# Patient Record
Sex: Male | Born: 1950 | Race: White | Hispanic: No | Marital: Married | Smoking: Current some day smoker
Health system: Southern US, Community
[De-identification: ages and names within clinical notes are randomized; demographics above are authoritative.]

## PROBLEM LIST (undated history)

## (undated) DIAGNOSIS — C801 Malignant (primary) neoplasm, unspecified: Secondary | ICD-10-CM

## (undated) HISTORY — PX: APPENDECTOMY: SHX54

---

## 1998-07-10 ENCOUNTER — Emergency Department (HOSPITAL_COMMUNITY): Admission: EM | Admit: 1998-07-10 | Discharge: 1998-07-11 | Payer: Self-pay | Admitting: Internal Medicine

## 1999-06-20 ENCOUNTER — Ambulatory Visit (HOSPITAL_BASED_OUTPATIENT_CLINIC_OR_DEPARTMENT_OTHER): Admission: RE | Admit: 1999-06-20 | Discharge: 1999-06-20 | Payer: Self-pay | Admitting: Orthopedic Surgery

## 1999-07-27 ENCOUNTER — Ambulatory Visit (HOSPITAL_BASED_OUTPATIENT_CLINIC_OR_DEPARTMENT_OTHER): Admission: RE | Admit: 1999-07-27 | Discharge: 1999-07-27 | Payer: Self-pay | Admitting: Orthopedic Surgery

## 2003-05-31 ENCOUNTER — Emergency Department (HOSPITAL_COMMUNITY): Admission: EM | Admit: 2003-05-31 | Discharge: 2003-05-31 | Payer: Self-pay | Admitting: Emergency Medicine

## 2003-05-31 ENCOUNTER — Encounter: Payer: Self-pay | Admitting: Emergency Medicine

## 2003-05-31 IMAGING — CT CT PELVIS W/O CM
1 series · 16 of 32 positions shown, 20 images · non-contrast
Comparison: NONE.

FINDINGS
CLINICAL DATA: RENAL CALCULI.
CT ABDOMEN AND PELVIS WITHOUT CONTRAST, [DATE], [3N] HOURS

[Series 2: renal stone · axial · 0.62mm/px · z∈[-561,-186]mm · 16 of 83 slices shown, 20 images]
[im 6/83  soft-tissue]
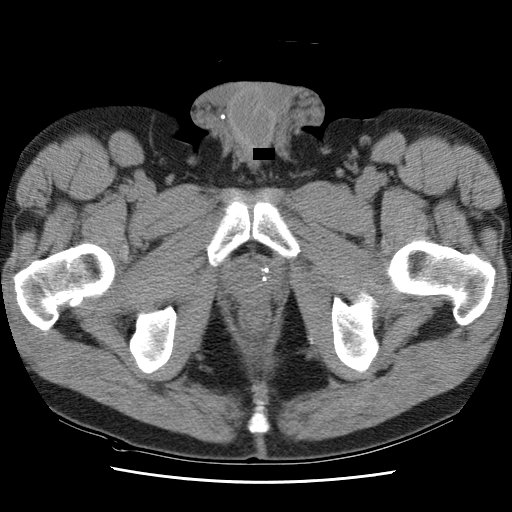
[im 6/83  bone]
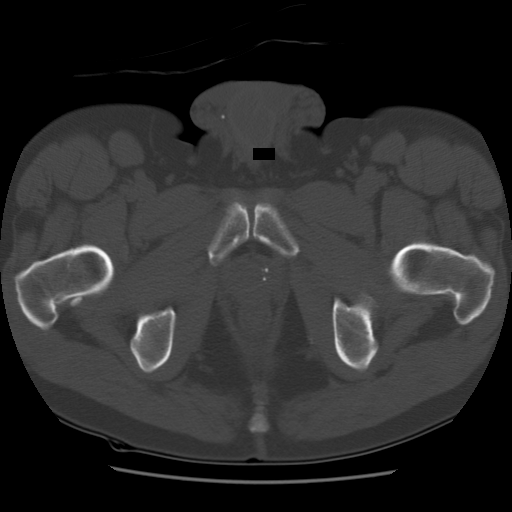
[im 11/83  soft-tissue]
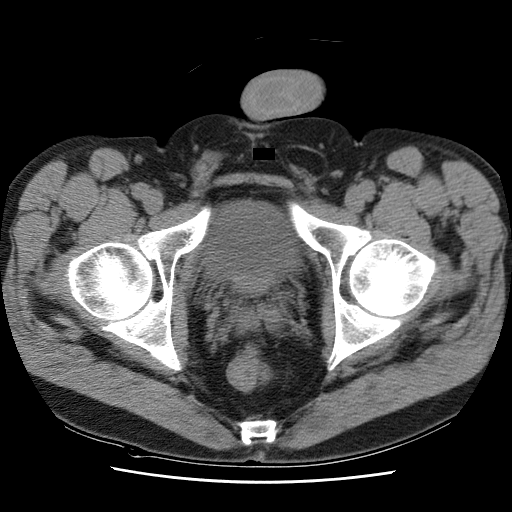
[im 16/83  soft-tissue]
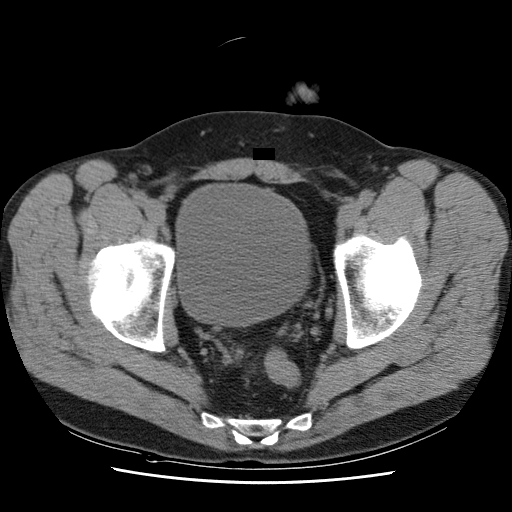
[im 22/83  soft-tissue]
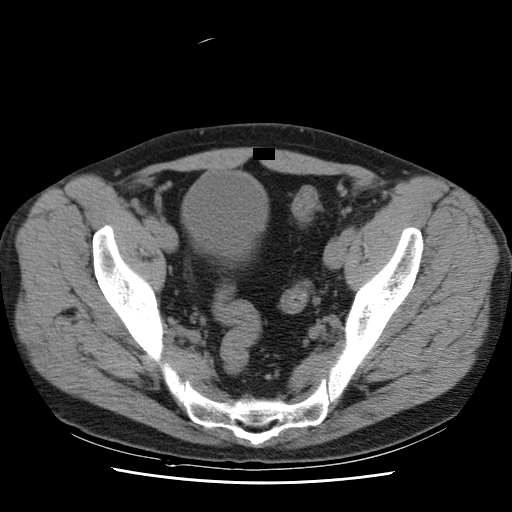
[im 27/83  soft-tissue]
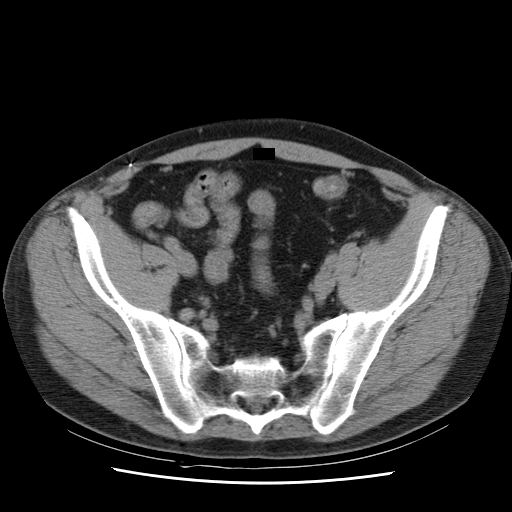
[im 32/83  soft-tissue]
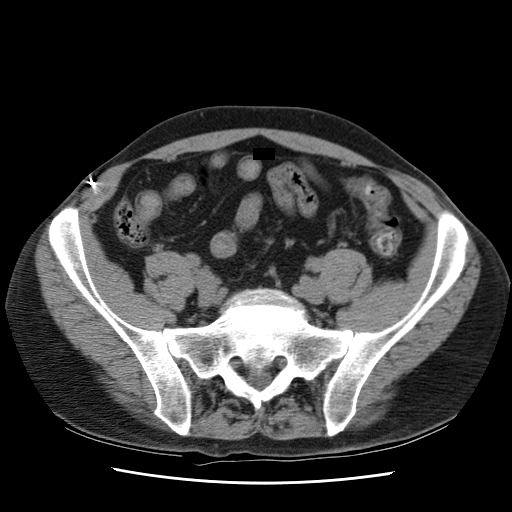
[im 38/83  soft-tissue]
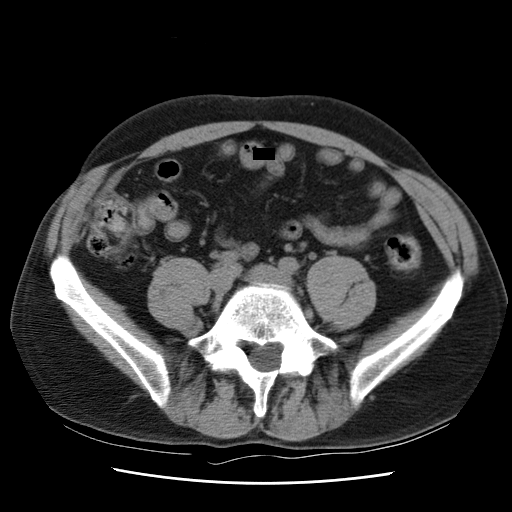
[im 45/83  soft-tissue]
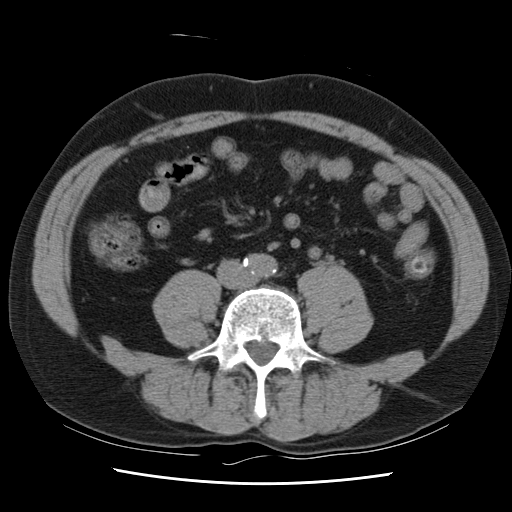
[im 51/83  soft-tissue]
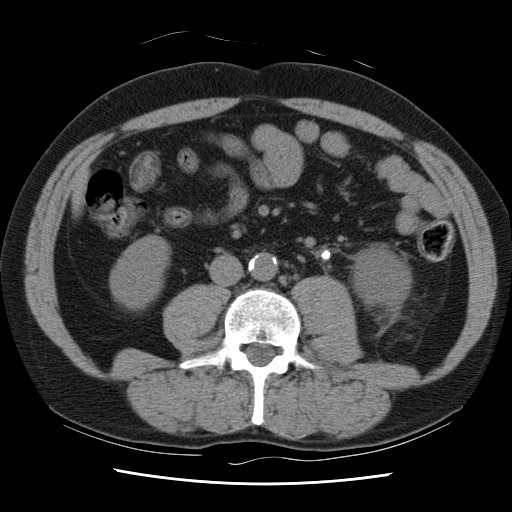
[im 51/83  bone]
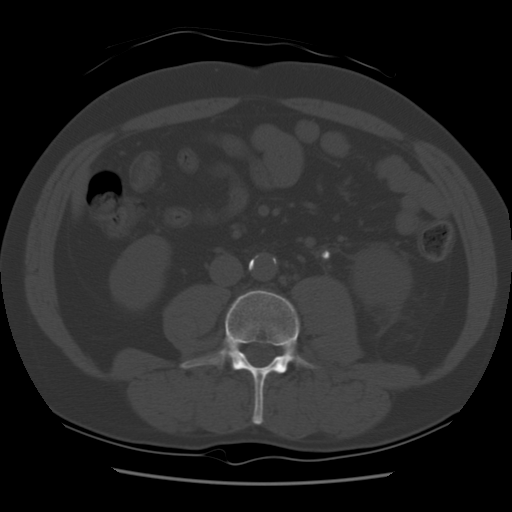
[im 56/83  soft-tissue]
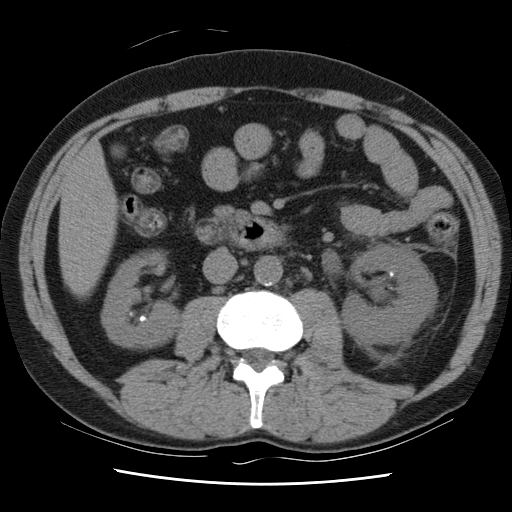
[im 61/83  soft-tissue]
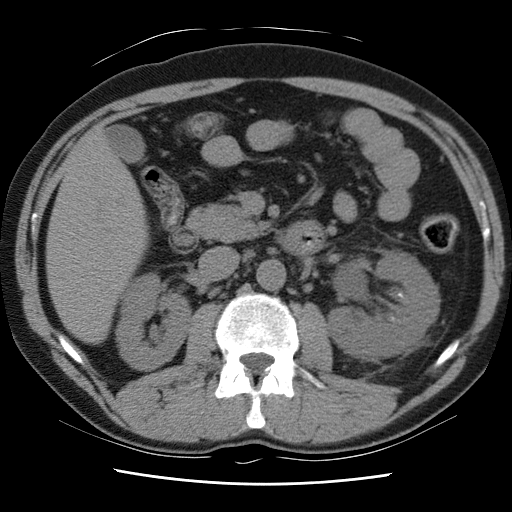
[im 67/83  soft-tissue]
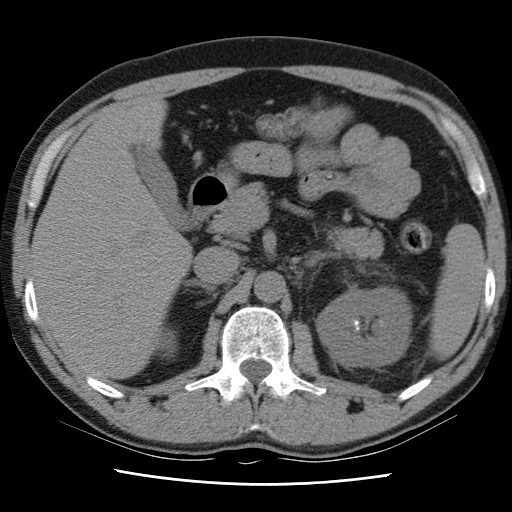
[im 72/83  soft-tissue]
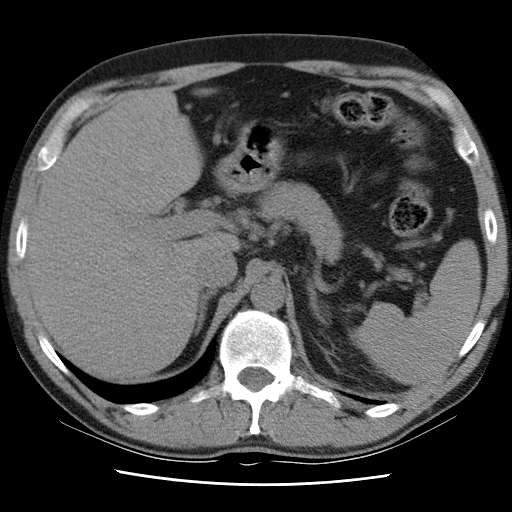
[im 72/83  lung]
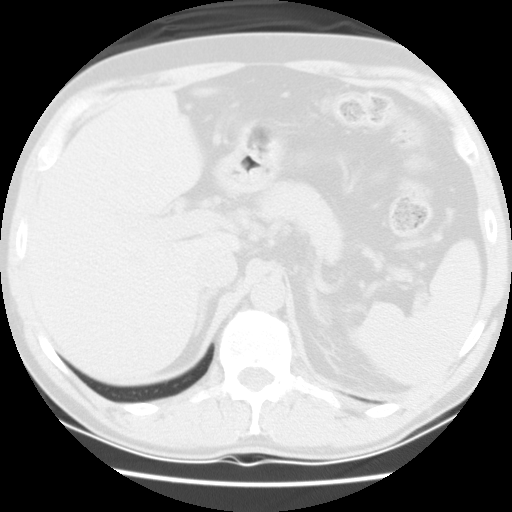
[im 75/83  lung]
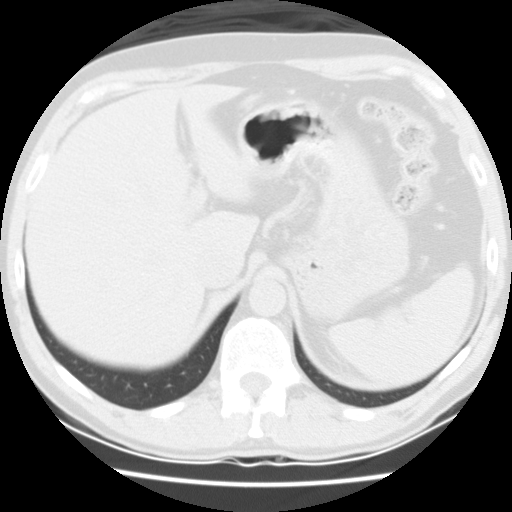
[im 77/83  soft-tissue]
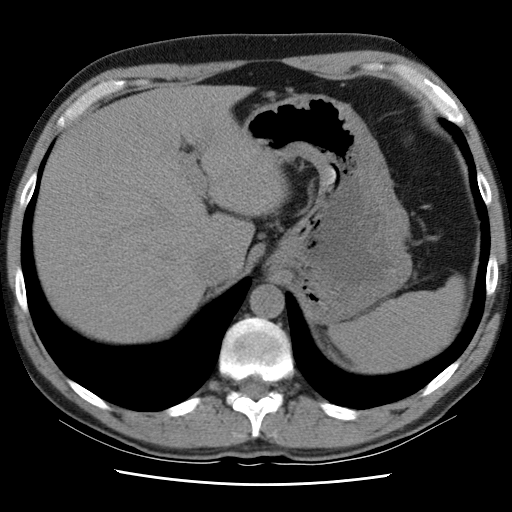
[im 77/83  lung]
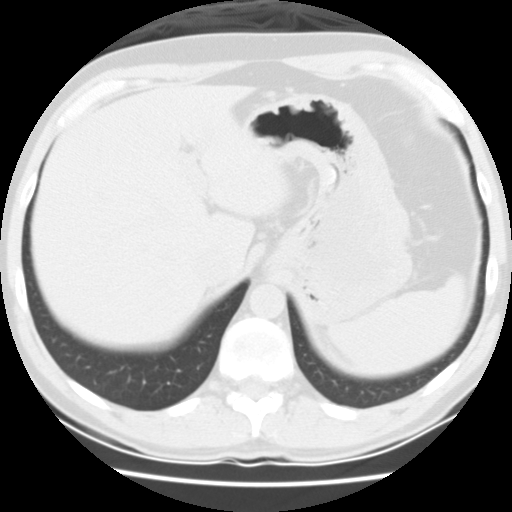
[im 80/83  lung]
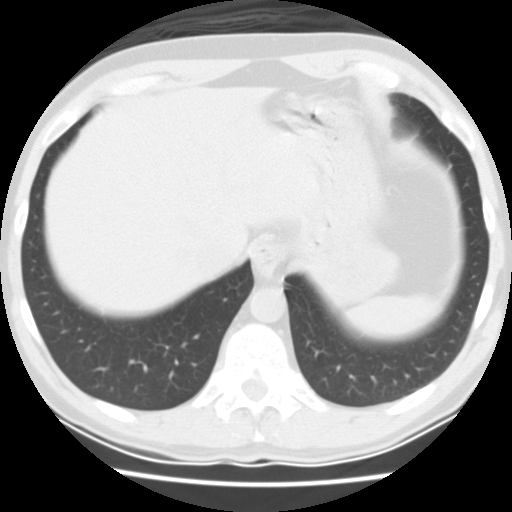

[16 of 32 positions shown; findings below may reference images not displayed]

TECHNICAL INFORMATION:  5 MM SPIRAL IMAGES WERE OBTAINED THROUGH THE ABDOMEN AND PELVIS WITHOUT
CONTRAST.
ABDOMEN
FINDINGS: MULTIPLE CALCULI ARE SEEN IN THE COLLECTING SYSTEM OF BOTH KIDNEYS.  MILD LEFT
HYDRONEPHROSIS AND PERINEPHRIC STRANDING ARE NOTED.  THERE IS A 6 MM CALCULUS IN THE PROXIMAL LEFT
URETER ON IMAGE 35.
THE VISUALIZED PORTIONS OF THE LIVER, SPLEEN, PANCREAS, AND ADRENAL GLANDS ARE WITHIN NORMAL
LIMITS.  NEGATIVE FREE FLUID.  NEGATIVE ABNORMAL ADENOPATHY.
IMPRESSION
1.  NEPHROLITHIASIS.
2.  LEFT URETERAL OBSTRUCTION FROM A LEFT 6-7 MM CALCULUS.
PELVIS
IN THE PELVIS THE BLADDER IS DISTENDED.  MULTIPLE PHLEBOLITHS ARE SEEN.  THERE IS NO EVIDENCE OF
URINARY CALCULUS IN THE PELVIS.
IMPRESSION
NO EVIDENCE OF URINARY CALCULUS IN THE PELVIS.

## 2003-06-01 ENCOUNTER — Inpatient Hospital Stay (HOSPITAL_COMMUNITY): Admission: EM | Admit: 2003-06-01 | Discharge: 2003-06-03 | Payer: Self-pay | Admitting: *Deleted

## 2003-06-01 ENCOUNTER — Encounter: Payer: Self-pay | Admitting: *Deleted

## 2003-06-02 ENCOUNTER — Encounter: Payer: Self-pay | Admitting: *Deleted

## 2003-06-03 ENCOUNTER — Emergency Department (HOSPITAL_COMMUNITY): Admission: EM | Admit: 2003-06-03 | Discharge: 2003-06-04 | Payer: Self-pay | Admitting: Emergency Medicine

## 2003-06-03 ENCOUNTER — Encounter: Payer: Self-pay | Admitting: *Deleted

## 2003-06-08 ENCOUNTER — Inpatient Hospital Stay (HOSPITAL_COMMUNITY): Admission: EM | Admit: 2003-06-08 | Discharge: 2003-06-09 | Payer: Self-pay | Admitting: Emergency Medicine

## 2003-06-08 ENCOUNTER — Encounter: Payer: Self-pay | Admitting: *Deleted

## 2003-06-09 ENCOUNTER — Encounter: Payer: Self-pay | Admitting: *Deleted

## 2007-06-26 ENCOUNTER — Emergency Department (HOSPITAL_COMMUNITY): Admission: EM | Admit: 2007-06-26 | Discharge: 2007-06-26 | Payer: Self-pay | Admitting: Emergency Medicine

## 2007-06-26 IMAGING — CT CT ABDOMEN W/O CM
1 series · 15 of 32 positions shown, 19 images · IV contrast (agent unspecified)
Comparison: Report dated [DATE].

CLINICAL DATA: Right flank pain, nausea and vomiting. History of
nephrolithiasis.

ABDOMEN CT WITHOUT CONTRAST:
TECHNIQUE: Helical transaxial images of the abdomen and pelvis were obtained
without intravenous or oral contrast.

[Series 2: stone_wo 5.0 b40f st · axial · 0.68mm/px · z∈[-523,-139]mm · 15 of 107 slices shown, 19 images]
[im 7/107  soft-tissue]
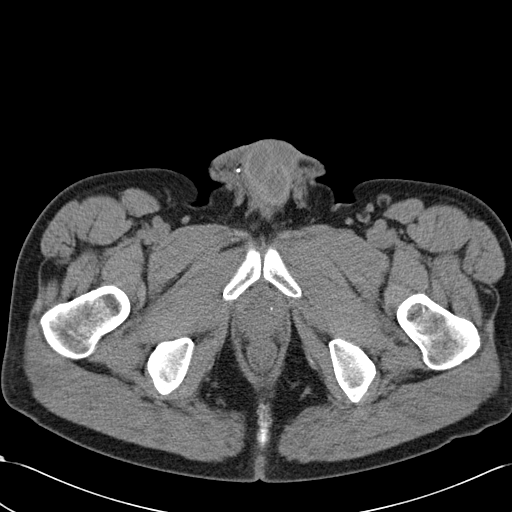
[im 7/107  bone]
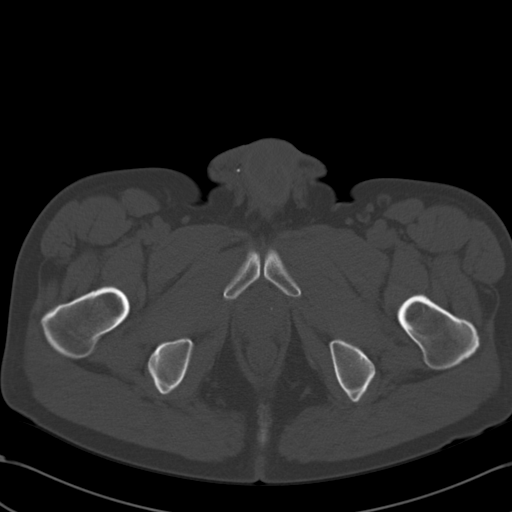
[im 14/107  soft-tissue]
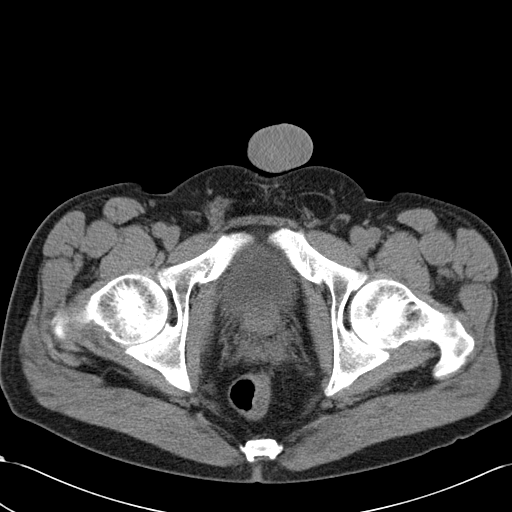
[im 21/107  soft-tissue]
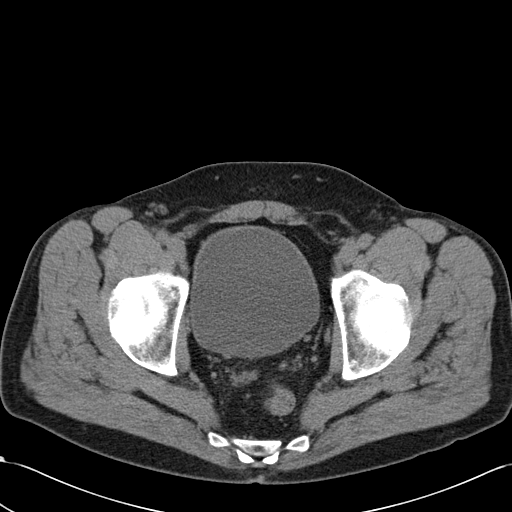
[im 31/107  soft-tissue]
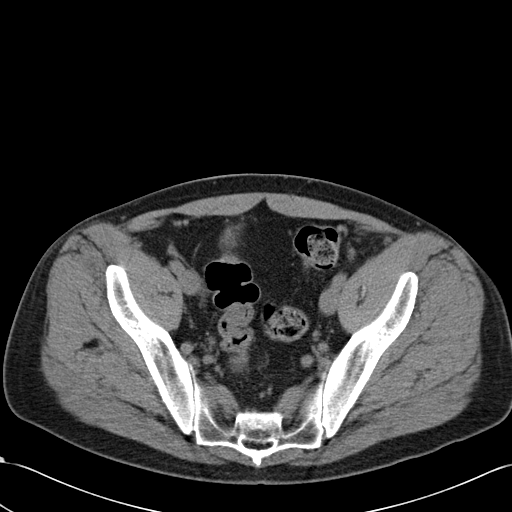
[im 38/107  soft-tissue]
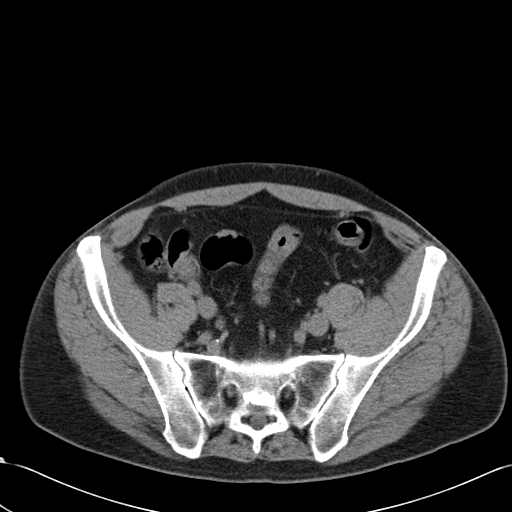
[im 45/107  soft-tissue]
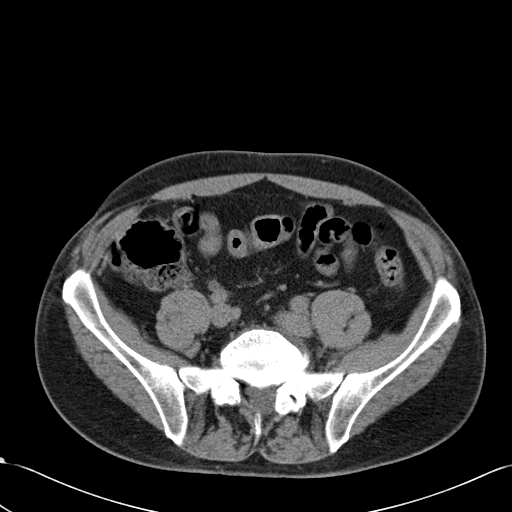
[im 55/107  soft-tissue]
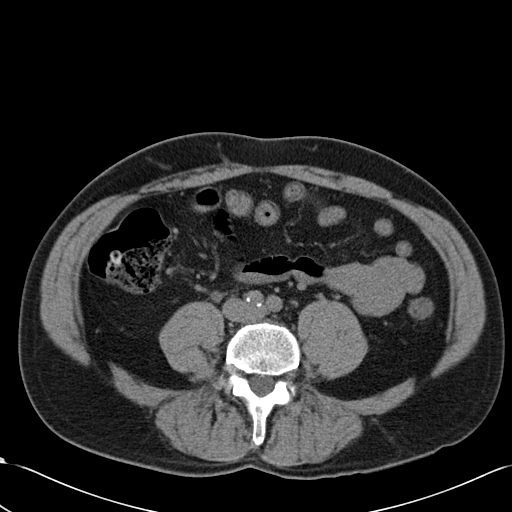
[im 62/107  soft-tissue]
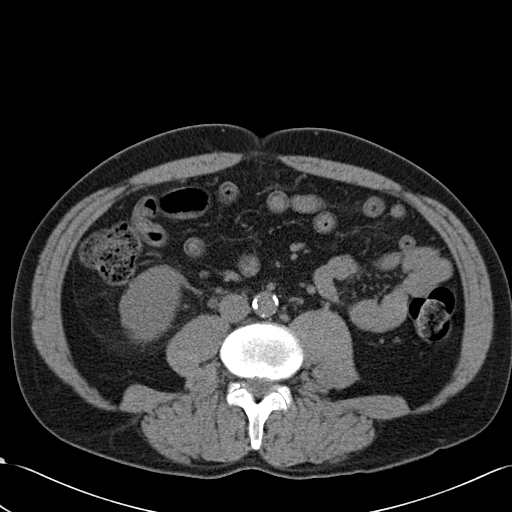
[im 69/107  soft-tissue]
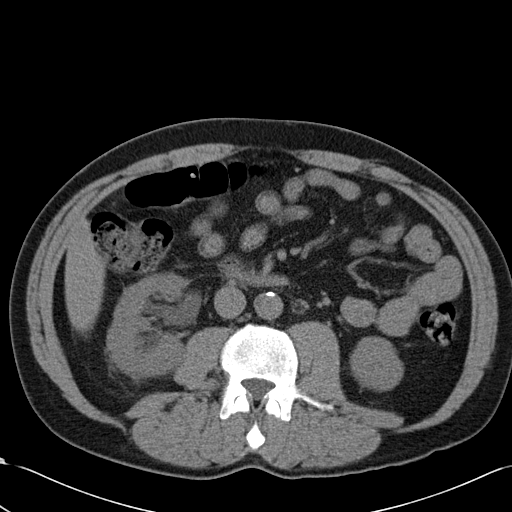
[im 69/107  bone]
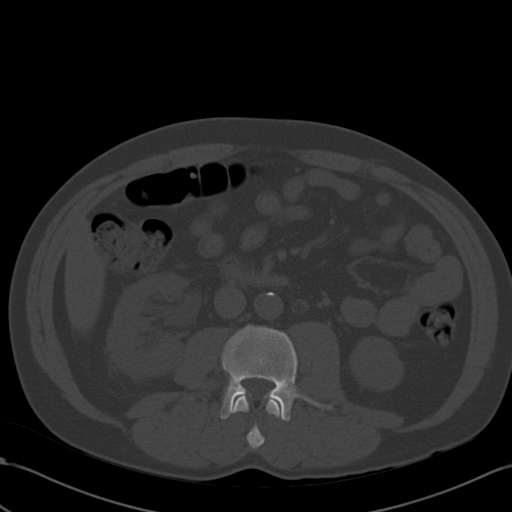
[im 76/107  soft-tissue]
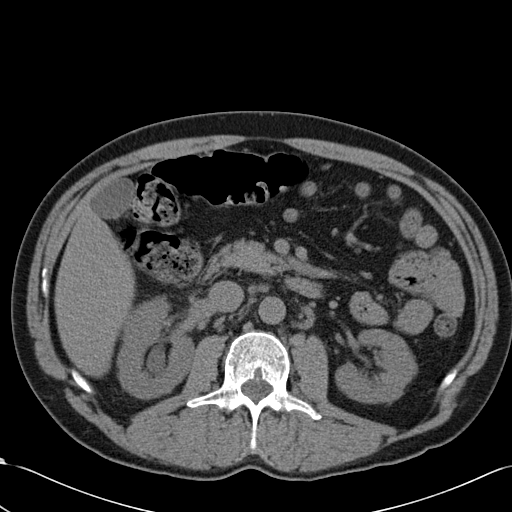
[im 86/107  soft-tissue]
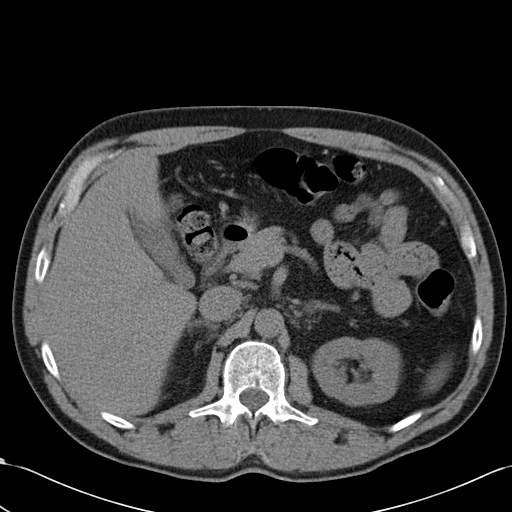
[im 93/107  soft-tissue]
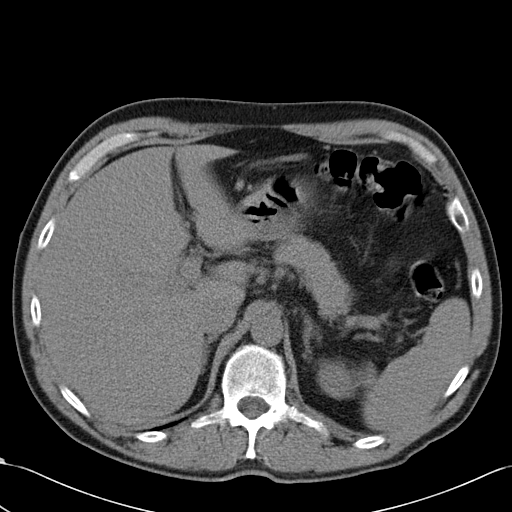
[im 93/107  lung]
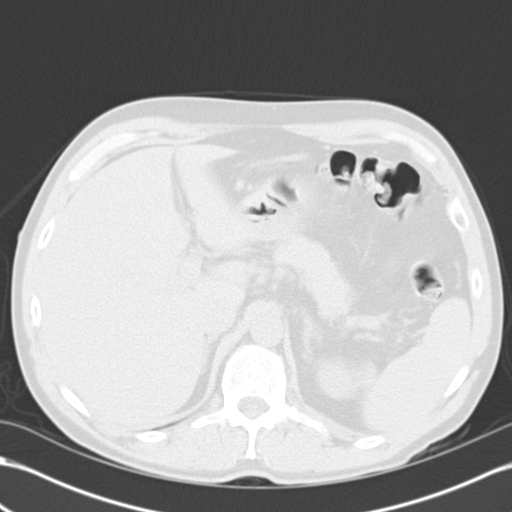
[im 96/107  lung]
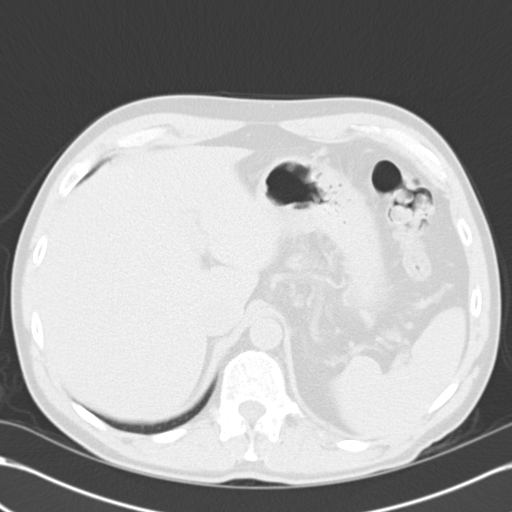
[im 100/107  soft-tissue]
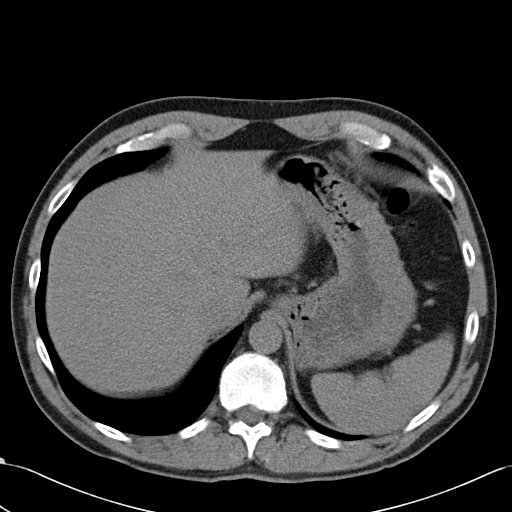
[im 100/107  lung]
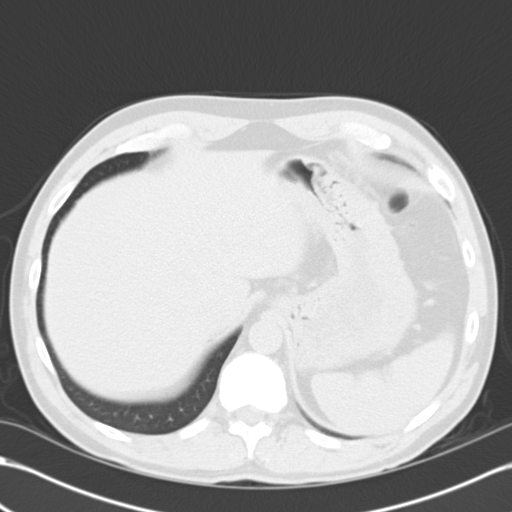
[im 103/107  lung]
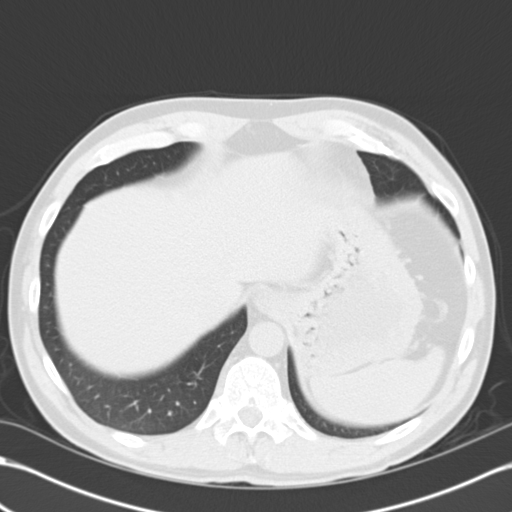

[15 of 32 positions shown; findings below may reference images not displayed]

FINDINGS: Multiple small bilateral renal calculi. 4 mm, 3 mm and additional 3
mm calculi in the proximal right ureter. 6 mm calculus in the proximal to the
mid right ureter. Moderate dilatation of the right renal collecting system.
Diffuse enlargement of the left kidney with mild perinephric soft tissue
stranding. Lumbar spine degenerative changes. Atheromatous artery
calcifications.
IMPRESSION: 1. Multiple right ureteral calculi, with associated moderate right
hydronephrosis.
2. Multiple small bilateral renal calculi.

PELVIS CT WITHOUT CONTRAST:
FINDINGS: 6 mm calculus in the proximal to mid pelvic portion of the right
ureter. Mild dilatation of the distal right ureter, to the level of the
ureterovesical junction, with no distal ureteral or bladder calculi seen.
Central prostatic calcifications. Small left inguinal hernia containing fat.
Left renal calculi seen.
IMPRESSION: 1. Total of 5 right ureteral calculi, measuring up to 6 mm in maximum diameter
each, causing moderate right hydronephrosis.

2. Mild dilatation of the distal right ureter, most likely due to recent stone
passage. 

3. Small left inguinal hernia containing fat.

## 2011-05-10 NOTE — Op Note (Signed)
NAME:  Caleb Simmons, Caleb Simmons                            ACCOUNT NO.:  [id number]   MEDICAL RECORD NO.:  [id number]                   PATIENT TYPE:  INP   LOCATION:  [id number]                                 FACILITY:  [facility]   PHYSICIAN:  Raelyn Mora., M.D.            DATE OF BIRTH:  1951-07-27   DATE OF PROCEDURE:  06/02/2003  DATE OF DISCHARGE:                                 OPERATIVE REPORT   PREOPERATIVE DIAGNOSIS:  Left ureteral calculus with obstruction 6 x 7 mm.   POSTOPERATIVE DIAGNOSES:  Left ureteral calculus with obstruction 6 x 7 mm.  Two stones in the left lower ureter and bilateral renal calculi.   OPERATION PERFORMED:  Cystoscopy, left ureteroscopic holmium laser  fragmentation and extraction of stone with insertion of stent #6 K[place].   DESCRIPTION OF PROCEDURE:  This 60 year old male brought to the operating  room underwent successful induction of general anesthesia after receiving IV  Cipro. He was prepped and draped in the lithotomy position, bladder  inspected with a #22 cystourethroscope using 70 and 12 degree lens. No  stone, tumor nor ulcer was noted in the bladder. The patient did have a  little anterior notching and mild median bar formation at the veru distance  about 3-4 cm. The ureteral orifices appeared normal. A 038 Glidewire was  passed through an open ended catheter size 6 French up the left ureter to  the region of the left renal pelvis under fluoroscopic control. The catheter  was advanced. A retrograde pyelogram was performed which showed dilation of  the pelvis and calices of a mild degree. The patient then had a spring wire  inserted through the open ended catheter which taped in place after the  lower ureter was dilated with a 4 cm UroMax dilating balloon for five  minutes. Then the 6.5 ureteroscope was passed up the ureter and about 6 cm 2  stones were encountered irregular, they varied in color from dark brown to  light yellow. The stones were too  large to extract with the basket,  therefore, they were fragmented into pieces with the holmium laser using 360  micron fiber. Two of the larger pieces were recovered, the smaller pieces  were either passed or flushed up into the kidney. The 038 spring wire was  then used to introduce a #6 double J ureteral stent which was a Clinical cytogeneticist. The stent had good curl in the kidney and a good curl in the bladder.  The string was taped to the penis, the bladder was drained with a #16 Foley  catheter and the patient returned to the recovery area.   The plan is to continue the patient on antibiotics, IV fluids. Will get a  KUB to see how many fragments are remaining and decide about further  manipulation.  Raelyn Mora., M.D.    HB/MEDQ  D:  06/02/2003  T:  06/03/2003  Job:  [id number]
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
## 2011-10-08 LAB — URINALYSIS, ROUTINE W REFLEX MICROSCOPIC
Bilirubin Urine: NEGATIVE
Nitrite: NEGATIVE
Specific Gravity, Urine: 1.016
pH: 6

## 2011-10-08 LAB — URINE MICROSCOPIC-ADD ON

## 2021-08-23 ENCOUNTER — Inpatient Hospital Stay (HOSPITAL_COMMUNITY)
Admission: EM | Admit: 2021-08-23 | Discharge: 2021-09-24 | DRG: 981 | Disposition: A | Discharge status: Home / Self Care | Payer: Commercial Managed Care - PPO | Attending: Internal Medicine | Admitting: Internal Medicine

## 2021-08-23 ENCOUNTER — Emergency Department (HOSPITAL_COMMUNITY): Payer: Commercial Managed Care - PPO

## 2021-08-23 ENCOUNTER — Other Ambulatory Visit: Payer: Self-pay

## 2021-08-23 ENCOUNTER — Encounter (HOSPITAL_COMMUNITY): Payer: Self-pay

## 2021-08-23 DIAGNOSIS — L821 Other seborrheic keratosis: Secondary | ICD-10-CM | POA: Diagnosis present

## 2021-08-23 DIAGNOSIS — N189 Chronic kidney disease, unspecified: Secondary | ICD-10-CM | POA: Diagnosis not present

## 2021-08-23 DIAGNOSIS — C9000 Multiple myeloma not having achieved remission: Secondary | ICD-10-CM | POA: Diagnosis present

## 2021-08-23 DIAGNOSIS — E854 Organ-limited amyloidosis: Secondary | ICD-10-CM

## 2021-08-23 DIAGNOSIS — I5031 Acute diastolic (congestive) heart failure: Secondary | ICD-10-CM | POA: Diagnosis not present

## 2021-08-23 DIAGNOSIS — R040 Epistaxis: Secondary | ICD-10-CM | POA: Diagnosis not present

## 2021-08-23 DIAGNOSIS — R338 Other retention of urine: Secondary | ICD-10-CM | POA: Diagnosis not present

## 2021-08-23 DIAGNOSIS — Z992 Dependence on renal dialysis: Secondary | ICD-10-CM | POA: Diagnosis not present

## 2021-08-23 DIAGNOSIS — A401 Sepsis due to streptococcus, group B: Secondary | ICD-10-CM | POA: Diagnosis not present

## 2021-08-23 DIAGNOSIS — N029 Recurrent and persistent hematuria with unspecified morphologic changes: Secondary | ICD-10-CM | POA: Diagnosis present

## 2021-08-23 DIAGNOSIS — E859 Amyloidosis, unspecified: Secondary | ICD-10-CM | POA: Diagnosis not present

## 2021-08-23 DIAGNOSIS — I959 Hypotension, unspecified: Secondary | ICD-10-CM | POA: Diagnosis not present

## 2021-08-23 DIAGNOSIS — B9561 Methicillin susceptible Staphylococcus aureus infection as the cause of diseases classified elsewhere: Secondary | ICD-10-CM

## 2021-08-23 DIAGNOSIS — I43 Cardiomyopathy in diseases classified elsewhere: Secondary | ICD-10-CM | POA: Diagnosis present

## 2021-08-23 DIAGNOSIS — T451X5A Adverse effect of antineoplastic and immunosuppressive drugs, initial encounter: Secondary | ICD-10-CM | POA: Diagnosis present

## 2021-08-23 DIAGNOSIS — Z6822 Body mass index (BMI) 22.0-22.9, adult: Secondary | ICD-10-CM

## 2021-08-23 DIAGNOSIS — R509 Fever, unspecified: Secondary | ICD-10-CM | POA: Diagnosis not present

## 2021-08-23 DIAGNOSIS — E872 Acidosis, unspecified: Secondary | ICD-10-CM | POA: Diagnosis present

## 2021-08-23 DIAGNOSIS — N200 Calculus of kidney: Secondary | ICD-10-CM | POA: Diagnosis present

## 2021-08-23 DIAGNOSIS — D631 Anemia in chronic kidney disease: Secondary | ICD-10-CM | POA: Diagnosis present

## 2021-08-23 DIAGNOSIS — R778 Other specified abnormalities of plasma proteins: Secondary | ICD-10-CM | POA: Diagnosis not present

## 2021-08-23 DIAGNOSIS — N360 Urethral fistula: Secondary | ICD-10-CM | POA: Diagnosis present

## 2021-08-23 DIAGNOSIS — D62 Acute posthemorrhagic anemia: Secondary | ICD-10-CM | POA: Diagnosis not present

## 2021-08-23 DIAGNOSIS — I3139 Other pericardial effusion (noninflammatory): Secondary | ICD-10-CM | POA: Diagnosis not present

## 2021-08-23 DIAGNOSIS — Z791 Long term (current) use of non-steroidal anti-inflammatories (NSAID): Secondary | ICD-10-CM

## 2021-08-23 DIAGNOSIS — Z800 Family history of malignant neoplasm of digestive organs: Secondary | ICD-10-CM

## 2021-08-23 DIAGNOSIS — Z515 Encounter for palliative care: Secondary | ICD-10-CM | POA: Diagnosis not present

## 2021-08-23 DIAGNOSIS — N390 Urinary tract infection, site not specified: Secondary | ICD-10-CM | POA: Diagnosis not present

## 2021-08-23 DIAGNOSIS — T39395A Adverse effect of other nonsteroidal anti-inflammatory drugs [NSAID], initial encounter: Secondary | ICD-10-CM | POA: Diagnosis present

## 2021-08-23 DIAGNOSIS — N99820 Postprocedural hemorrhage and hematoma of a genitourinary system organ or structure following a genitourinary system procedure: Secondary | ICD-10-CM | POA: Diagnosis not present

## 2021-08-23 DIAGNOSIS — D649 Anemia, unspecified: Secondary | ICD-10-CM | POA: Diagnosis not present

## 2021-08-23 DIAGNOSIS — E538 Deficiency of other specified B group vitamins: Secondary | ICD-10-CM | POA: Diagnosis present

## 2021-08-23 DIAGNOSIS — J9811 Atelectasis: Secondary | ICD-10-CM | POA: Diagnosis present

## 2021-08-23 DIAGNOSIS — R55 Syncope and collapse: Secondary | ICD-10-CM | POA: Diagnosis present

## 2021-08-23 DIAGNOSIS — R000 Tachycardia, unspecified: Secondary | ICD-10-CM | POA: Diagnosis not present

## 2021-08-23 DIAGNOSIS — Z20822 Contact with and (suspected) exposure to covid-19: Secondary | ICD-10-CM | POA: Diagnosis present

## 2021-08-23 DIAGNOSIS — R7881 Bacteremia: Secondary | ICD-10-CM

## 2021-08-23 DIAGNOSIS — N186 End stage renal disease: Secondary | ICD-10-CM | POA: Diagnosis not present

## 2021-08-23 DIAGNOSIS — N185 Chronic kidney disease, stage 5: Secondary | ICD-10-CM | POA: Diagnosis not present

## 2021-08-23 DIAGNOSIS — N179 Acute kidney failure, unspecified: Secondary | ICD-10-CM | POA: Diagnosis present

## 2021-08-23 DIAGNOSIS — E440 Moderate protein-calorie malnutrition: Secondary | ICD-10-CM | POA: Insufficient documentation

## 2021-08-23 DIAGNOSIS — I5041 Acute combined systolic (congestive) and diastolic (congestive) heart failure: Secondary | ICD-10-CM | POA: Diagnosis not present

## 2021-08-23 DIAGNOSIS — F17210 Nicotine dependence, cigarettes, uncomplicated: Secondary | ICD-10-CM | POA: Diagnosis present

## 2021-08-23 DIAGNOSIS — Z66 Do not resuscitate: Secondary | ICD-10-CM | POA: Diagnosis not present

## 2021-08-23 DIAGNOSIS — E8581 Light chain (AL) amyloidosis: Secondary | ICD-10-CM | POA: Diagnosis present

## 2021-08-23 DIAGNOSIS — I248 Other forms of acute ischemic heart disease: Secondary | ICD-10-CM | POA: Diagnosis present

## 2021-08-23 DIAGNOSIS — F419 Anxiety disorder, unspecified: Secondary | ICD-10-CM | POA: Diagnosis present

## 2021-08-23 DIAGNOSIS — B951 Streptococcus, group B, as the cause of diseases classified elsewhere: Secondary | ICD-10-CM | POA: Diagnosis not present

## 2021-08-23 DIAGNOSIS — I776 Arteritis, unspecified: Secondary | ICD-10-CM | POA: Diagnosis present

## 2021-08-23 DIAGNOSIS — R0600 Dyspnea, unspecified: Secondary | ICD-10-CM

## 2021-08-23 DIAGNOSIS — I5021 Acute systolic (congestive) heart failure: Secondary | ICD-10-CM | POA: Diagnosis not present

## 2021-08-23 DIAGNOSIS — R066 Hiccough: Secondary | ICD-10-CM | POA: Diagnosis not present

## 2021-08-23 DIAGNOSIS — E875 Hyperkalemia: Secondary | ICD-10-CM | POA: Diagnosis present

## 2021-08-23 DIAGNOSIS — N08 Glomerular disorders in diseases classified elsewhere: Secondary | ICD-10-CM | POA: Diagnosis not present

## 2021-08-23 DIAGNOSIS — R9431 Abnormal electrocardiogram [ECG] [EKG]: Secondary | ICD-10-CM | POA: Diagnosis not present

## 2021-08-23 DIAGNOSIS — Z7189 Other specified counseling: Secondary | ICD-10-CM | POA: Diagnosis not present

## 2021-08-23 DIAGNOSIS — N3289 Other specified disorders of bladder: Secondary | ICD-10-CM | POA: Diagnosis present

## 2021-08-23 DIAGNOSIS — D849 Immunodeficiency, unspecified: Secondary | ICD-10-CM | POA: Diagnosis present

## 2021-08-23 DIAGNOSIS — D509 Iron deficiency anemia, unspecified: Secondary | ICD-10-CM | POA: Diagnosis present

## 2021-08-23 DIAGNOSIS — Y848 Other medical procedures as the cause of abnormal reaction of the patient, or of later complication, without mention of misadventure at the time of the procedure: Secondary | ICD-10-CM | POA: Diagnosis not present

## 2021-08-23 DIAGNOSIS — H5462 Unqualified visual loss, left eye, normal vision right eye: Secondary | ICD-10-CM | POA: Diagnosis present

## 2021-08-23 DIAGNOSIS — R7989 Other specified abnormal findings of blood chemistry: Secondary | ICD-10-CM | POA: Diagnosis present

## 2021-08-23 LAB — IRON AND TIBC
Iron: 12 ug/dL — ABNORMAL LOW (ref 45–182)
Saturation Ratios: 3 % — ABNORMAL LOW (ref 17.9–39.5)
TIBC: 350 ug/dL (ref 250–450)
UIBC: 338 ug/dL

## 2021-08-23 LAB — URINALYSIS, ROUTINE W REFLEX MICROSCOPIC
Bacteria, UA: NONE SEEN
Bilirubin Urine: NEGATIVE
Glucose, UA: 50 mg/dL — AB
Ketones, ur: NEGATIVE mg/dL
Leukocytes,Ua: NEGATIVE
Nitrite: NEGATIVE
Protein, ur: >=300 mg/dL — AB
Specific Gravity, Urine: 1.008 (ref 1.005–1.030)
pH: 6 (ref 5.0–8.0)

## 2021-08-23 LAB — HEPATIC FUNCTION PANEL
ALT: 10 U/L (ref 0–44)
AST: 17 U/L (ref 15–41)
Albumin: 2.2 g/dL — ABNORMAL LOW (ref 3.5–5.0)
Alkaline Phosphatase: 66 U/L (ref 38–126)
Bilirubin, Direct: <0.1 mg/dL (ref 0.0–0.2)
Total Bilirubin: 0.3 mg/dL (ref 0.3–1.2)
Total Protein: 5.6 g/dL — ABNORMAL LOW (ref 6.5–8.1)

## 2021-08-23 LAB — CBC
HCT: 20.5 % — ABNORMAL LOW (ref 39.0–52.0)
Hemoglobin: 6 g/dL — CL (ref 13.0–17.0)
MCH: 23.3 pg — ABNORMAL LOW (ref 26.0–34.0)
MCHC: 29.3 g/dL — ABNORMAL LOW (ref 30.0–36.0)
MCV: 79.5 fL — ABNORMAL LOW (ref 80.0–100.0)
Platelets: 421 10*3/uL — ABNORMAL HIGH (ref 150–400)
RBC: 2.58 MIL/uL — ABNORMAL LOW (ref 4.22–5.81)
RDW: 19.7 % — ABNORMAL HIGH (ref 11.5–15.5)
WBC: 14.2 10*3/uL — ABNORMAL HIGH (ref 4.0–10.5)
nRBC: 0 % (ref 0.0–0.2)

## 2021-08-23 LAB — LACTATE DEHYDROGENASE: LDH: 204 U/L — ABNORMAL HIGH (ref 98–192)

## 2021-08-23 LAB — BASIC METABOLIC PANEL
Anion gap: 11 (ref 5–15)
BUN: 44 mg/dL — ABNORMAL HIGH (ref 8–23)
CO2: 16 mmol/L — ABNORMAL LOW (ref 22–32)
Calcium: 8 mg/dL — ABNORMAL LOW (ref 8.9–10.3)
Chloride: 111 mmol/L (ref 98–111)
Creatinine, Ser: 6.56 mg/dL — ABNORMAL HIGH (ref 0.61–1.24)
GFR, Estimated: 8 mL/min — ABNORMAL LOW (ref >60)
Glucose, Bld: 97 mg/dL (ref 70–99)
Potassium: 4.4 mmol/L (ref 3.5–5.1)
Sodium: 138 mmol/L (ref 135–145)

## 2021-08-23 LAB — HEPATITIS B CORE ANTIBODY, TOTAL: Hep B Core Total Ab: NONREACTIVE

## 2021-08-23 LAB — HEPATITIS C ANTIBODY: HCV Ab: NONREACTIVE

## 2021-08-23 LAB — TROPONIN I (HIGH SENSITIVITY)
Troponin I (High Sensitivity): 611 ng/L (ref <18)
Troponin I (High Sensitivity): 649 ng/L (ref <18)
Troponin I (High Sensitivity): 614 ng/L (ref <18)
Troponin I (High Sensitivity): 668 ng/L (ref <18)

## 2021-08-23 LAB — POC OCCULT BLOOD, ED: Fecal Occult Bld: NEGATIVE

## 2021-08-23 LAB — HIV ANTIBODY (ROUTINE TESTING W REFLEX): HIV Screen 4th Generation wRfx: NONREACTIVE

## 2021-08-23 LAB — MAGNESIUM: Magnesium: 1.6 mg/dL — ABNORMAL LOW (ref 1.7–2.4)

## 2021-08-23 LAB — D-DIMER, QUANTITATIVE: D-Dimer, Quant: 1.61 ug/mL-FEU — ABNORMAL HIGH (ref 0.00–0.50)

## 2021-08-23 LAB — PREPARE RBC (CROSSMATCH)

## 2021-08-23 LAB — HEPATITIS B SURFACE ANTIGEN: Hepatitis B Surface Ag: NONREACTIVE

## 2021-08-23 LAB — HEMOGLOBIN AND HEMATOCRIT, BLOOD
HCT: 21.7 % — ABNORMAL LOW (ref 39.0–52.0)
Hemoglobin: 6.5 g/dL — CL (ref 13.0–17.0)

## 2021-08-23 LAB — SARS CORONAVIRUS 2 (TAT 6-24 HRS): SARS Coronavirus 2: NEGATIVE

## 2021-08-23 LAB — LIPASE, BLOOD: Lipase: 70 U/L — ABNORMAL HIGH (ref 11–51)

## 2021-08-23 LAB — ABO/RH: ABO/RH(D): A NEG

## 2021-08-23 LAB — FERRITIN: Ferritin: 20 ng/mL — ABNORMAL LOW (ref 24–336)

## 2021-08-23 LAB — BRAIN NATRIURETIC PEPTIDE: B Natriuretic Peptide: 1300 pg/mL — ABNORMAL HIGH (ref 0.0–100.0)

## 2021-08-23 IMAGING — CT CT RENAL STONE PROTOCOL
2 of 4 series · 16 of 46 positions shown, 18 images · non-contrast
Comparison: [DATE]

CLINICAL DATA: Hematuria, new renal failure

EXAM:
CT ABDOMEN AND PELVIS WITHOUT CONTRAST
TECHNIQUE: Multidetector CT imaging of the abdomen and pelvis was performed
following the standard protocol without IV contrast.

[Series 3: ap without · axial · non-contrast · 0.86mm/px · z∈[-716,-326]mm · 13 of 88 slices shown, 15 images]
[im 5/88  soft-tissue]
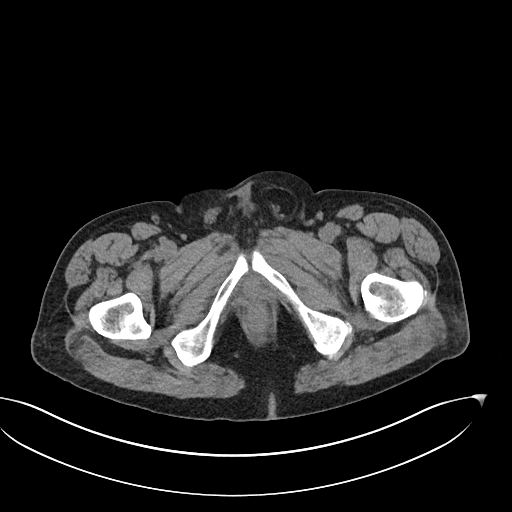
[im 5/88  bone]
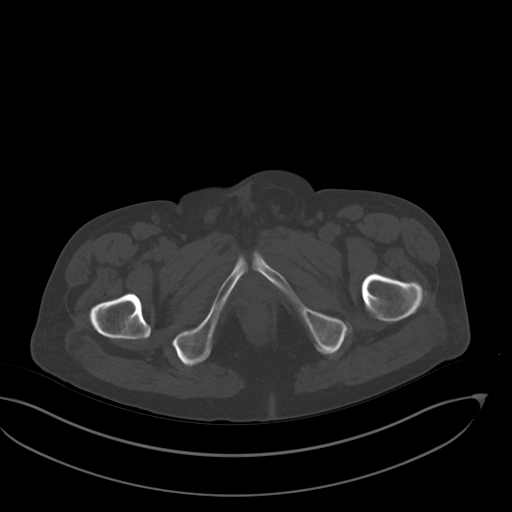
[im 14/88  soft-tissue]
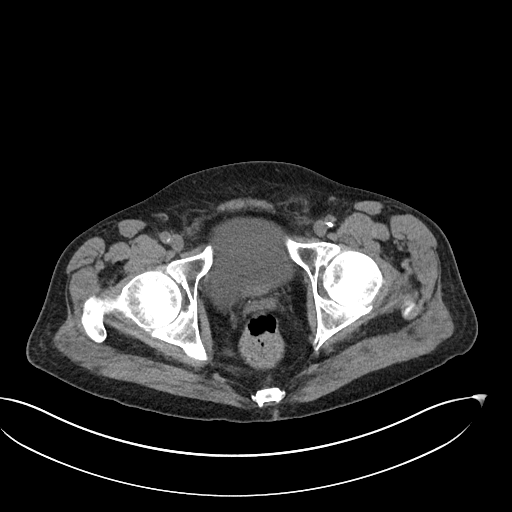
[im 19/88  soft-tissue]
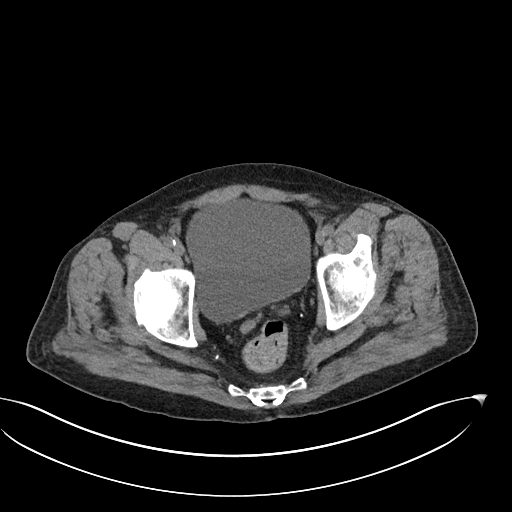
[im 23/88  soft-tissue]
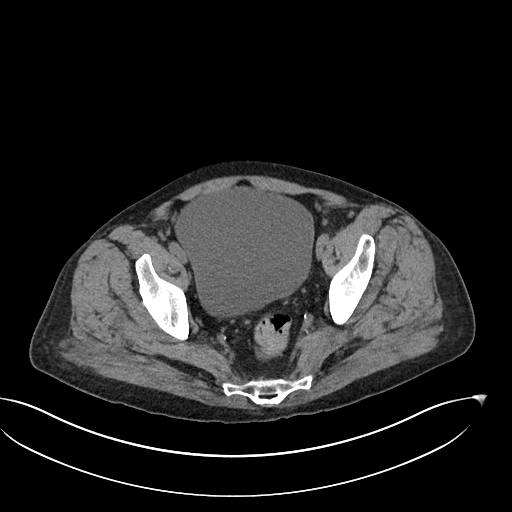
[im 33/88  soft-tissue]
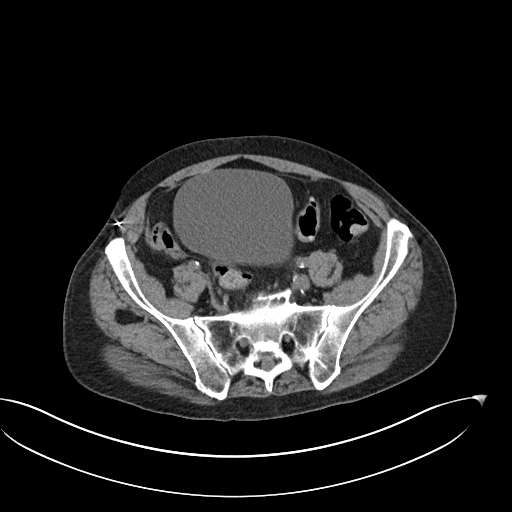
[im 37/88  soft-tissue]
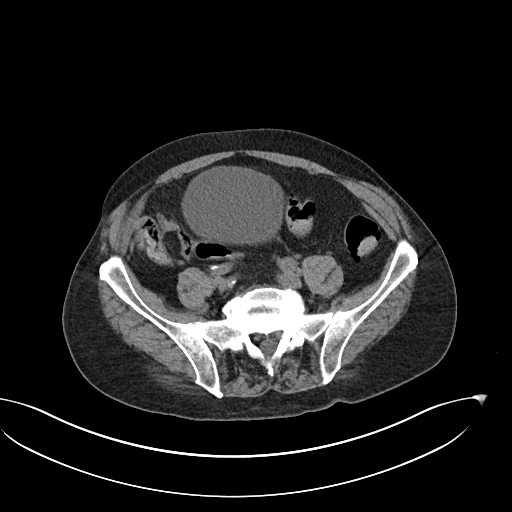
[im 46/88  soft-tissue]
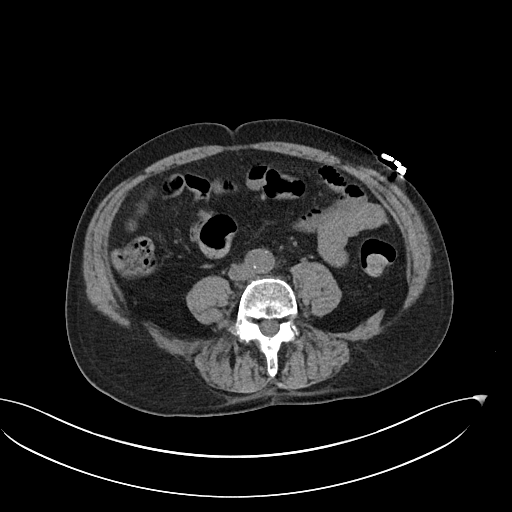
[im 51/88  soft-tissue]
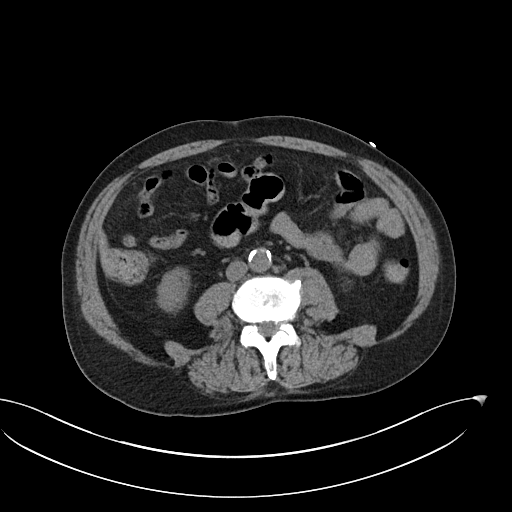
[im 55/88  soft-tissue]
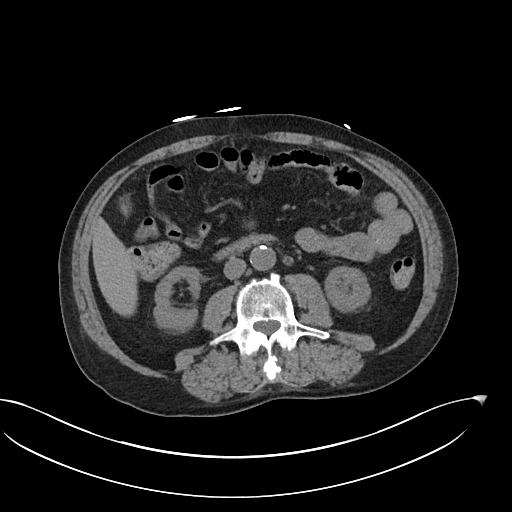
[im 55/88  bone]
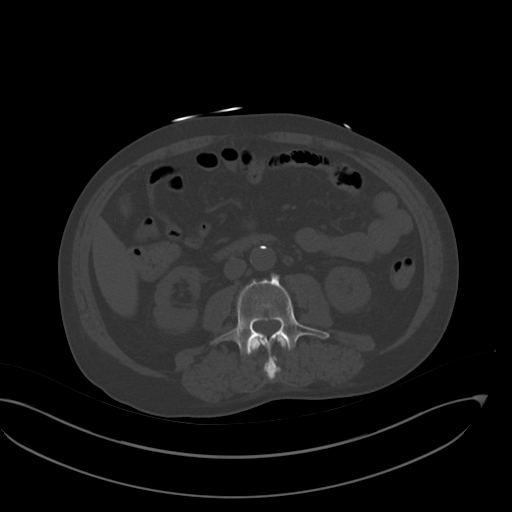
[im 65/88  soft-tissue]
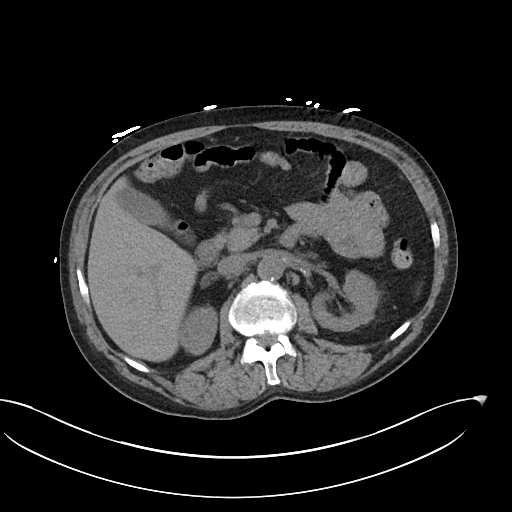
[im 69/88  soft-tissue]
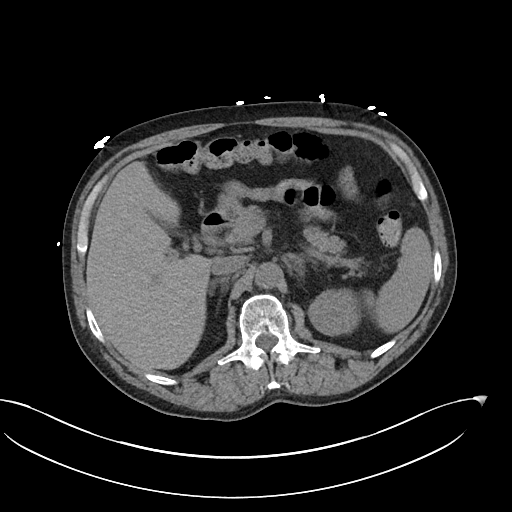
[im 74/88  soft-tissue]
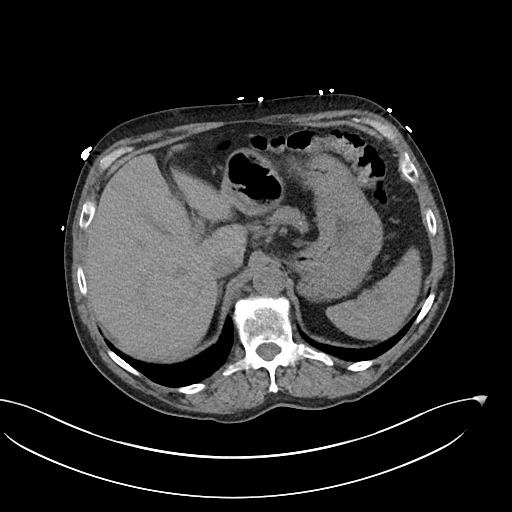
[im 83/88  soft-tissue]
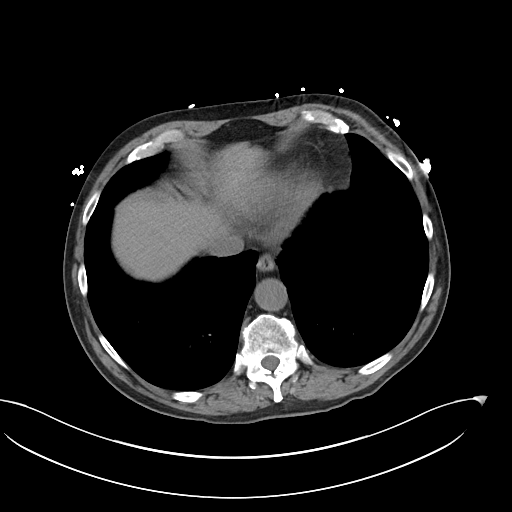

[Series 6: cor · coronal · 0.77mm/px · 3 of 96 slices shown]
[im 32/96  soft-tissue]
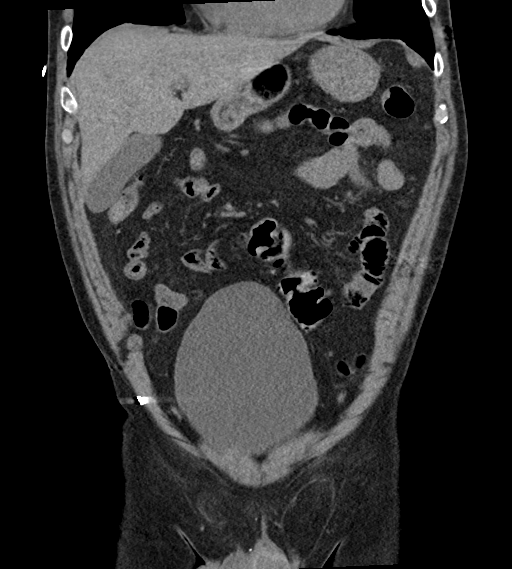
[im 43/96  soft-tissue]
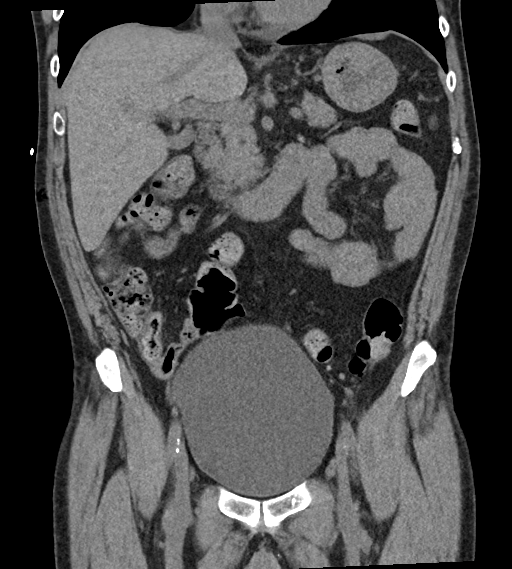
[im 53/96  soft-tissue]
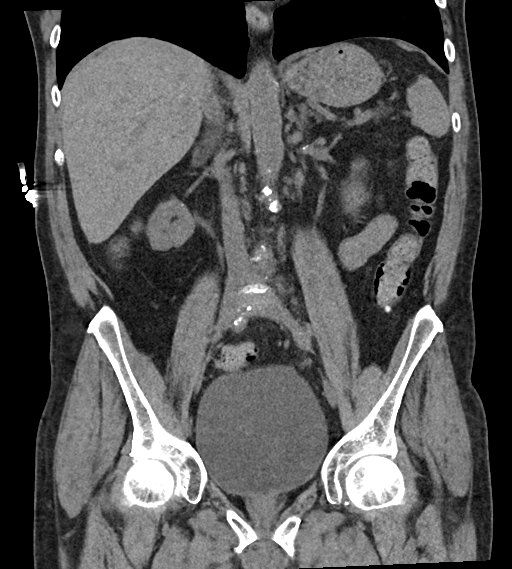

[16 of 46 positions shown; findings below may reference images not displayed]

FINDINGS: Lower chest: There is mild, scattered, nonspecific ground-glass and
fine nodularity throughout the included bilateral lung bases (series
[DATE]).

Hepatobiliary: No solid liver abnormality is seen. No gallstones,
gallbladder wall thickening, or biliary dilatation.

Pancreas: Unremarkable. No pancreatic ductal dilatation or
surrounding inflammatory changes.

Spleen: Normal in size without significant abnormality.

Adrenals/Urinary Tract: Adrenal glands are unremarkable. Kidneys are
normal, without renal calculi, solid lesion, or hydronephrosis.
Distended urinary bladder, measuring at least 17.4 cm.

Stomach/Bowel: Stomach is within normal limits. Appendix not clearly
visualized and may be surgically. No evidence of bowel wall
thickening, distention, or inflammatory changes. Descending and
sigmoid diverticulosis.

Vascular/Lymphatic: Aortic atherosclerosis. No enlarged abdominal or
pelvic lymph nodes.

Reproductive: No mass or other significant abnormality.

Other: Small, fat containing bilateral inguinal hernias no
abdominopelvic ascites.

Musculoskeletal: No acute or significant osseous findings.
IMPRESSION: 1. No evidence of urinary tract calculus or hydronephrosis.
2. Distended urinary bladder, measuring at least 17.4 cm. Correlate
for urinary retention.
3. Descending and sigmoid diverticulosis without evidence of acute
diverticulitis.
4. Mild, nonspecific scattered ground-glass and fine nodularity
throughout the bilateral lung bases, possibly infectious or
inflammatory.

Aortic Atherosclerosis ([EM]-[EM]).

## 2021-08-23 IMAGING — NM NM PULMONARY PERF PARTICULATE
8 series · 8 of 8 positions shown · non-contrast
Comparison: Same-day chest radiographs

CLINICAL DATA: PE suspected, shortness of breath

EXAM:
NUCLEAR MEDICINE PERFUSION LUNG SCAN
TECHNIQUE: Perfusion images were obtained in multiple projections after
intravenous injection of radiopharmaceutical.
Ventilation scans intentionally deferred if perfusion scan and chest
x-ray adequate for interpretation during COVID 19 epidemic.
RADIOPHARMACEUTICALS:  4.2 mCi [54] MAA IV

[Series 1: ant/post vent · 4.14mm/px · 1 of 1 slices shown (1 of 2)]
[im 1/1]
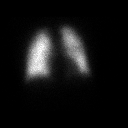

[Series 1: ant/post vent · 4.14mm/px · 1 of 1 slices shown (2 of 2)]
[im 1/1]
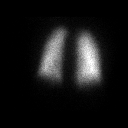

[Series 2: lao/rpo vent · 4.14mm/px · 1 of 1 slices shown (1 of 2)]
[im 1/1]
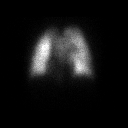

[Series 2: lao/rpo vent · 4.14mm/px · 1 of 1 slices shown (2 of 2)]
[im 1/1]
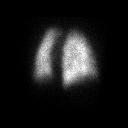

[Series 3: lpo/rao vent · 4.14mm/px · 1 of 1 slices shown (1 of 2)]
[im 1/1]
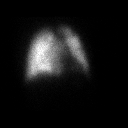

[Series 3: lpo/rao vent · 4.14mm/px · 1 of 1 slices shown (2 of 2)]
[im 1/1]
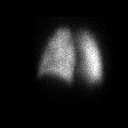

[Series 4: lt lat/rt lat vent · 4.14mm/px · 1 of 1 slices shown (1 of 2)]
[im 1/1]
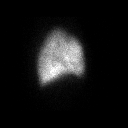

[Series 4: lt lat/rt lat vent · 4.14mm/px · 1 of 1 slices shown (2 of 2)]
[im 1/1]
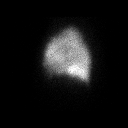

[8 of 8 positions shown; findings below may reference images not displayed]

FINDINGS: Normal, homogeneous pulmonary perfusion. No suspicious filling
defects.
IMPRESSION: Very low probability examination for pulmonary embolism by modified
perfusion only PIOPED criteria (PE absent).

## 2021-08-23 IMAGING — CR DG CHEST 2V
3 series · 3 of 3 positions shown · non-contrast
Comparison: None.

CLINICAL DATA: Near syncope.

EXAM:
CHEST - 2 VIEW

[chest lat (1 of 2)]
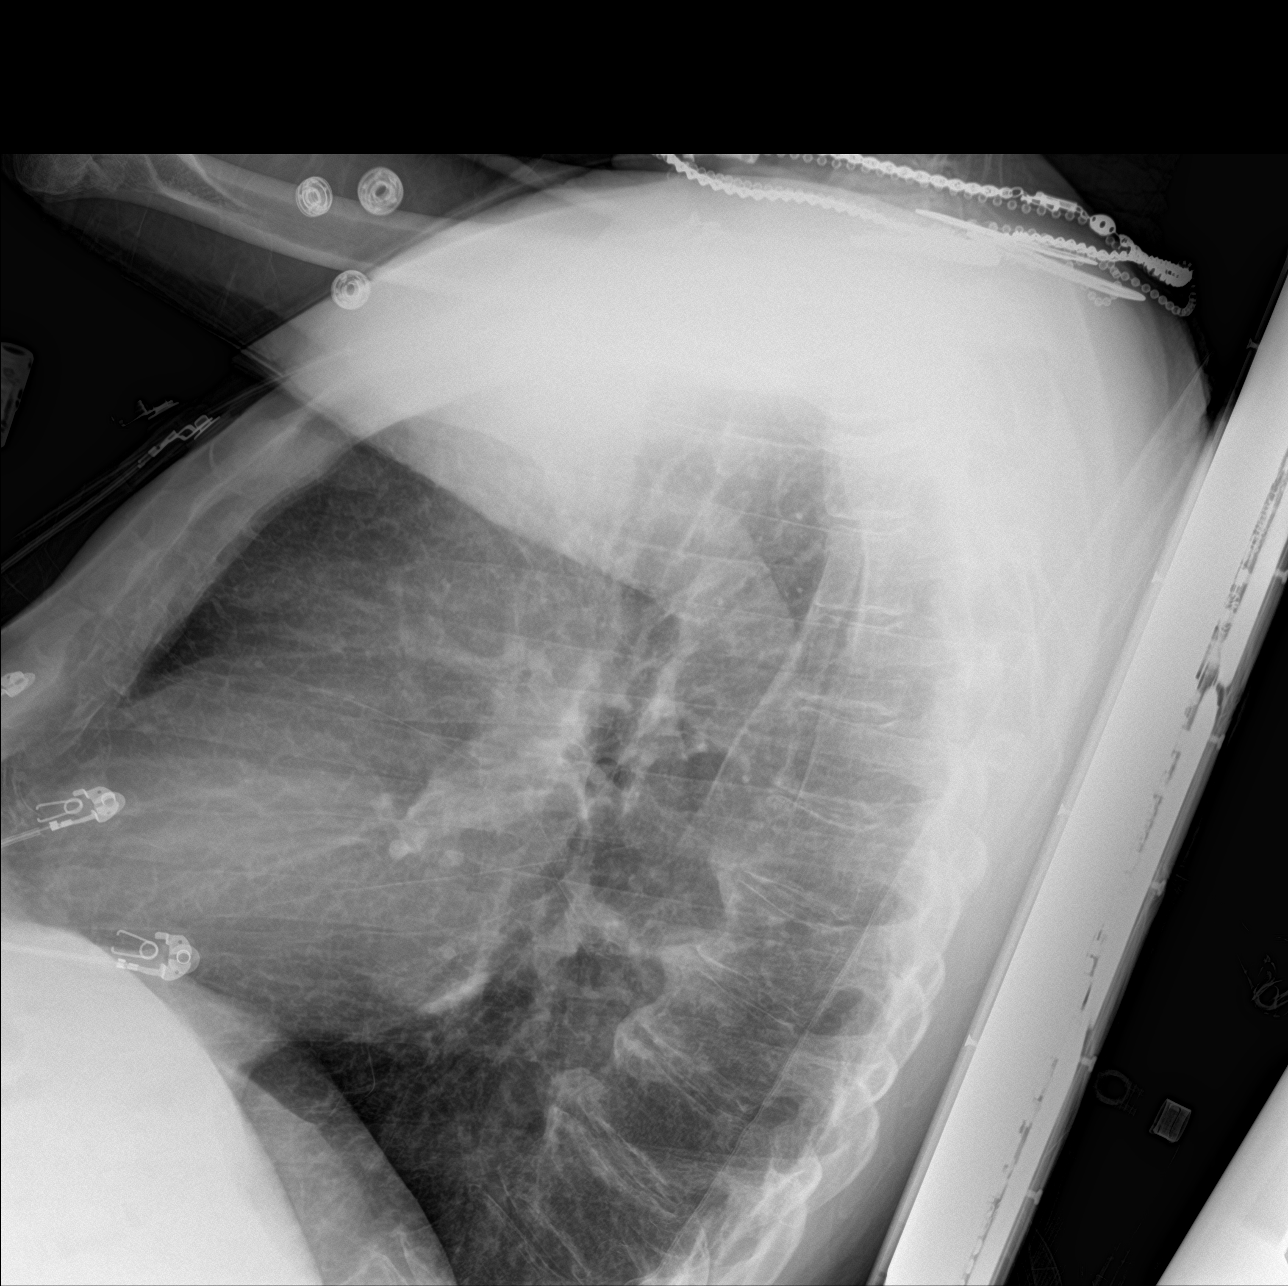

[chest ap]
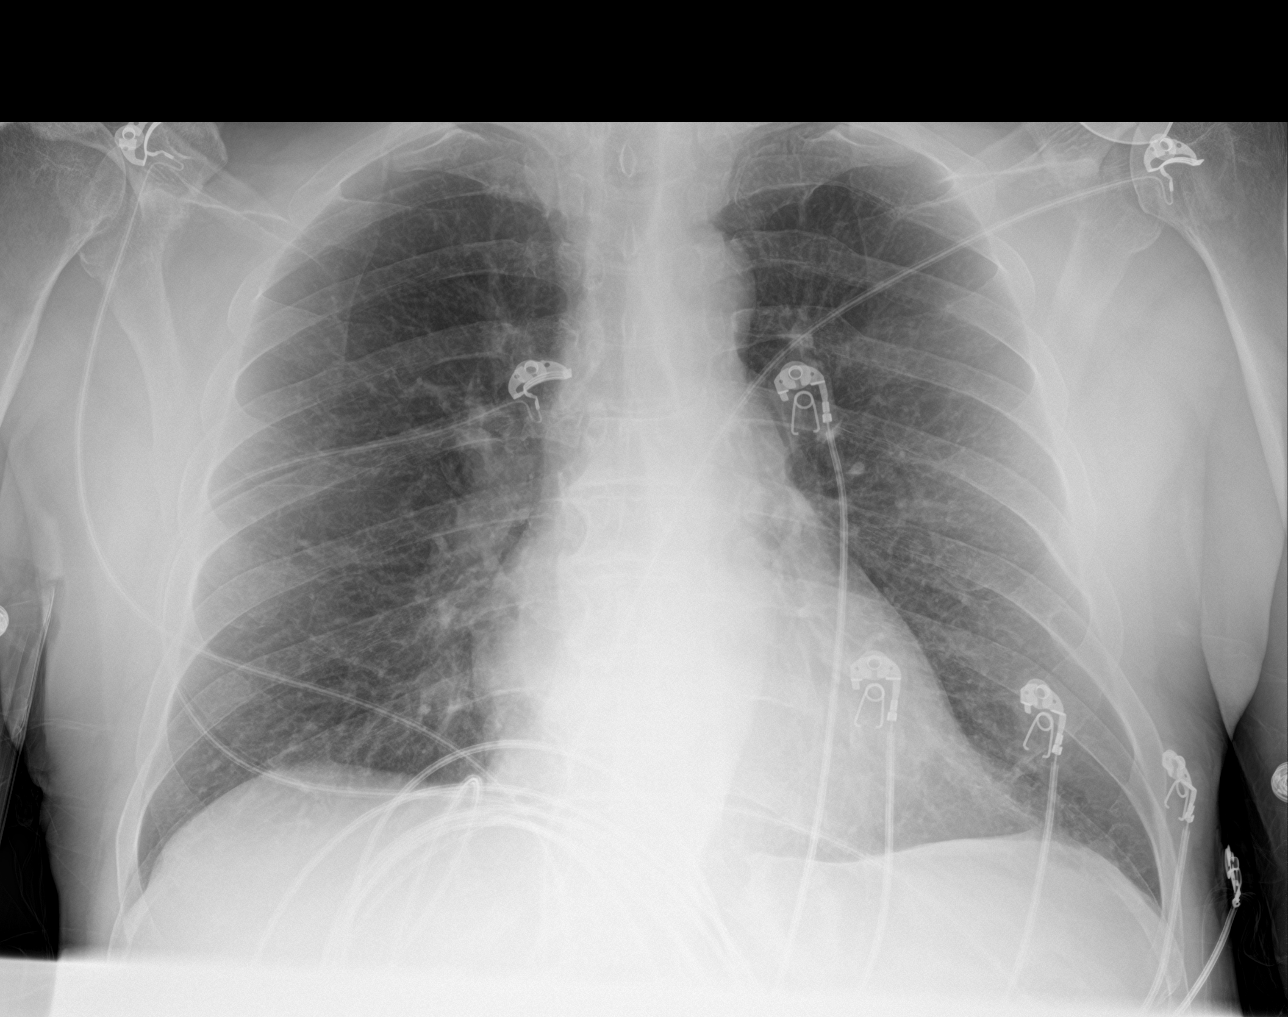

[chest lat (2 of 2)]
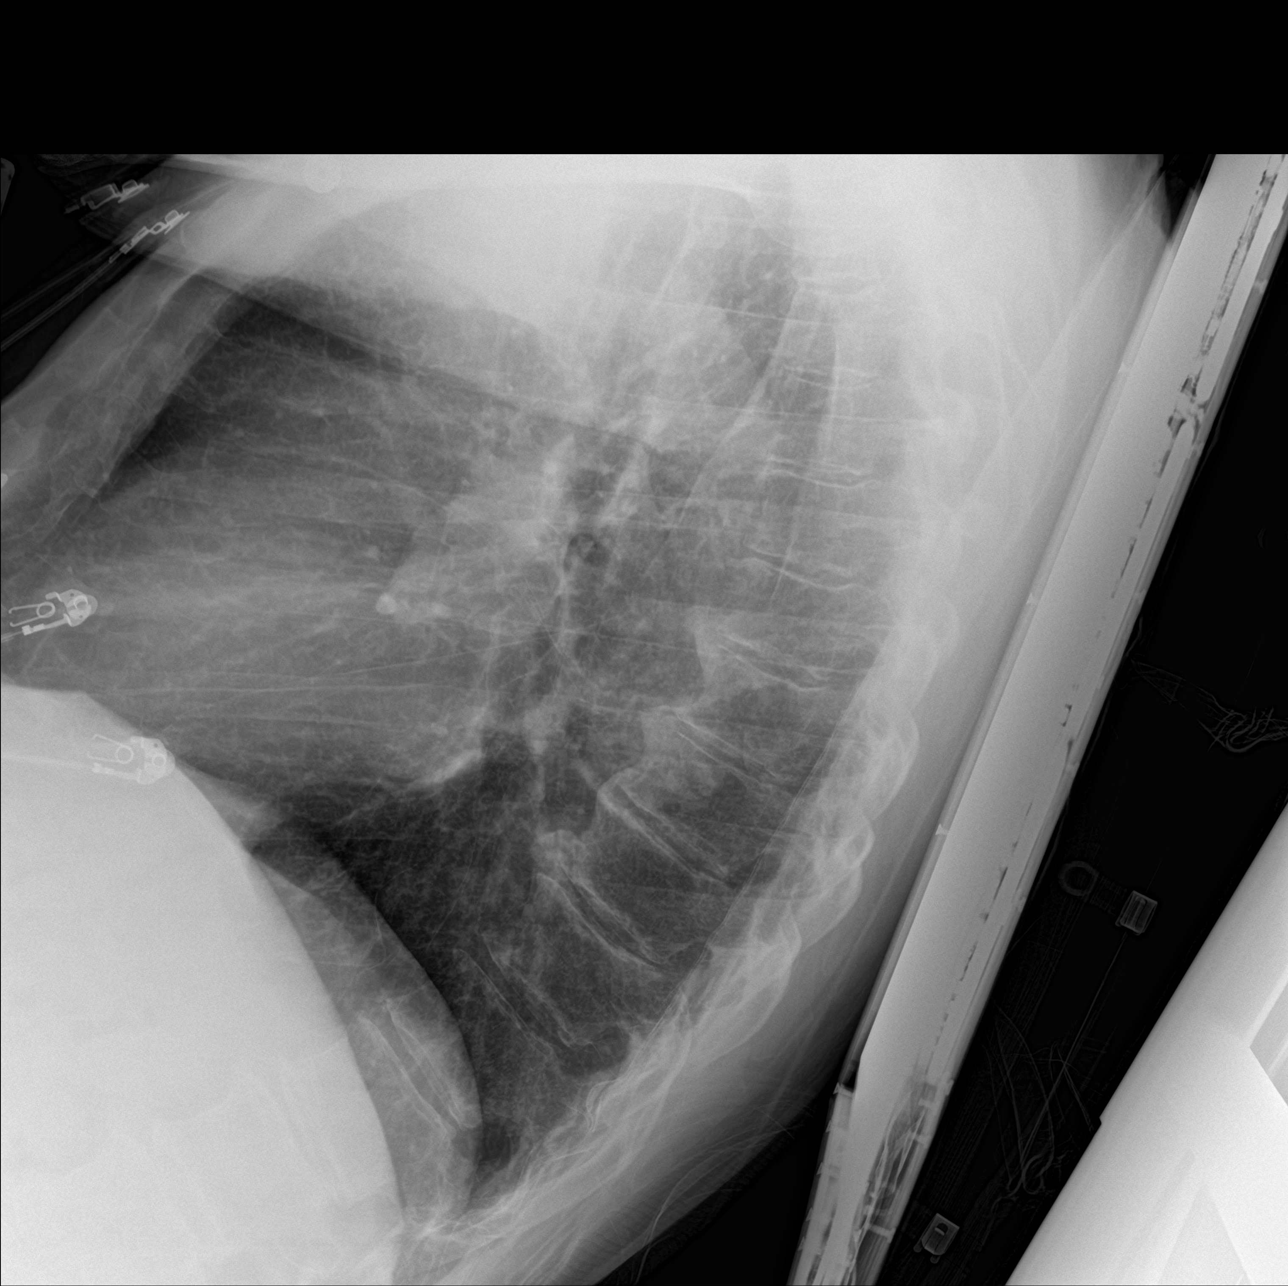

[3 of 3 positions shown; findings below may reference images not displayed]

FINDINGS: The lungs are clear without focal pneumonia, edema, pneumothorax or
pleural effusion. Cardiopericardial silhouette is at upper limits of
normal for size. The visualized bony structures of the thorax show
no acute abnormality. Telemetry leads overlie the chest.
IMPRESSION: No active cardiopulmonary disease.

## 2021-08-23 MED ORDER — SODIUM CHLORIDE 0.9 % IV BOLUS
1000.0000 mL | Freq: Once | INTRAVENOUS | Status: AC
  Administered 2021-08-23: 1000 mL via INTRAVENOUS

## 2021-08-23 MED ORDER — HEPARIN SODIUM (PORCINE) 5000 UNIT/ML IJ SOLN
5000.0000 [IU] | Freq: Three times a day (TID) | INTRAMUSCULAR | Status: DC
  Administered 2021-08-23: 5000 [IU] via SUBCUTANEOUS
  Filled 2021-08-23 (×2): qty 1

## 2021-08-23 MED ORDER — ONDANSETRON HCL 4 MG/2ML IJ SOLN
4.0000 mg | Freq: Four times a day (QID) | INTRAMUSCULAR | Status: DC | PRN
  Administered 2021-08-28 – 2021-09-24 (×16): 4 mg via INTRAVENOUS
  Filled 2021-08-23 (×17): qty 2

## 2021-08-23 MED ORDER — NICOTINE 14 MG/24HR TD PT24
14.0000 mg | MEDICATED_PATCH | TRANSDERMAL | Status: DC
  Administered 2021-08-24 – 2021-08-25 (×2): 14 mg via TRANSDERMAL
  Filled 2021-08-23 (×5): qty 1

## 2021-08-23 MED ORDER — SODIUM CHLORIDE 0.9 % IV SOLN: 10.0000 mL/h | Freq: Once | INTRAVENOUS | Status: DC

## 2021-08-23 MED ORDER — SODIUM BICARBONATE 650 MG PO TABS
1300.0000 mg | ORAL_TABLET | Freq: Two times a day (BID) | ORAL | Status: DC
  Administered 2021-08-23 – 2021-08-24 (×3): 1300 mg via ORAL
  Filled 2021-08-23 (×3): qty 2

## 2021-08-23 MED ORDER — PROMETHAZINE HCL 6.25 MG/5ML PO SYRP
6.2500 mg | ORAL_SOLUTION | ORAL | Status: DC | PRN
  Administered 2021-09-11 – 2021-09-23 (×14): 6.25 mg via ORAL
  Filled 2021-08-23 (×16): qty 5

## 2021-08-23 MED ORDER — BISACODYL 5 MG PO TBEC
5.0000 mg | DELAYED_RELEASE_TABLET | Freq: Every day | ORAL | Status: DC | PRN
  Administered 2021-08-25: 5 mg via ORAL
  Filled 2021-08-23: qty 1

## 2021-08-23 MED ORDER — SODIUM CHLORIDE 0.9 % IV SOLN: INTRAVENOUS | Status: DC

## 2021-08-23 MED ORDER — TECHNETIUM TO 99M ALBUMIN AGGREGATED: 4.2000 | Freq: Once | INTRAVENOUS | Status: DC | PRN

## 2021-08-23 MED ORDER — PANTOPRAZOLE SODIUM 40 MG PO TBEC
40.0000 mg | DELAYED_RELEASE_TABLET | Freq: Two times a day (BID) | ORAL | Status: DC
  Administered 2021-08-23 – 2021-09-24 (×62): 40 mg via ORAL
  Filled 2021-08-23 (×62): qty 1

## 2021-08-23 MED ORDER — ONDANSETRON HCL 4 MG PO TABS
4.0000 mg | ORAL_TABLET | Freq: Four times a day (QID) | ORAL | Status: DC | PRN
  Administered 2021-09-11: 4 mg via ORAL
  Filled 2021-08-23 (×2): qty 1

## 2021-08-23 NOTE — ED Notes (Signed)
Patient transported to X-ray 

## 2021-08-23 NOTE — ED Notes (Signed)
I messaged resident that pt's repeat H&H was 6.5 and 21.7

## 2021-08-23 NOTE — ED Notes (Signed)
Patient transported to nuclear medicine 

## 2021-08-23 NOTE — ED Notes (Signed)
Admitting team paged again since they never answered my message about the repeat H&H.

## 2021-08-23 NOTE — ED Notes (Signed)
Report given to 5W RN

## 2021-08-23 NOTE — Consult Note (Addendum)
[facility]  HISTORY AND PHYSICAL  Caleb Simmons is an 70 y.o. male.    Chief Complaint: dizziness, vision loss, SOB  HPI: Pt is a 26M with a PMH sig for nephrolithiasis (followed by Dr Diona Fanti) and chronic NSAID use who is now seen in consultation at the request of Dr Vanita Panda for eval and recs re: AKI.  Pt was in his usual state of health until about 1 week ago.  Started having some SOB which got worse over the past week.  Went to urgent care as he thought he had sinusitis/ bronchitis.  Was given Augmentin and dextromethorphan cough syrup.    His symptoms progressed and today he felt dizzy and nearly blacked out at work.  He was transported to [facility] ED.  In the ED, he was found to have a Hgb of 6.0 and a Cr of 6.35, K 4.4, CO2 16, Ca 8.0, Albumin 2.2, trop 611--> 649, WBC ct 14.2, Plts 421.  CXR clear.   In this setting we are asked to see.  In regards to nephrolithiasis, has seen Dr Diona Fanti for years.  Has had multiple lithotripsies in the past.  Has cut down on sweet tea and pepsi so he actually doesn't remember the last time he had a stone.  He does consume 2-3 BC powders daily and has done so for years.  Still working- for [employer].  Smokes 1/2 ppd, no EtOH, no drugs.   No f/c, n/v, CP, LE edema, rashes, dry/red eyes, nosebleeds, oral ulcers.  Has some seborrheic keratoses.  Says he's had some tea colored urine in the past- can't quantify.  Clear now.  PMH: History reviewed. No pertinent past medical history. PSH: Past Surgical History:  Procedure Laterality Date   APPENDECTOMY      History reviewed. No pertinent past medical history.  Medications: Augmentin 875-125 mg BID (started 08/14/21) Promethazine- dextromethorphan cough syrup BC powders   ALLERGIES:  No Known Allergies  FAM HX: No renal pertinent history  Social History:   reports that he has been smoking cigarettes. He has a 6.25 pack-year smoking history. He has never used smokeless  tobacco. No history on file for alcohol use and drug use.  ROS: ROS: all other systems reviewed and are negative except as per HPI  Blood pressure (!) 149/96, pulse 93, temperature 98 F (36.7 C), temperature source Oral, resp. rate (!) 24, height 5\' 10"  (1.778 m), weight 81.6 kg, SpO2 97 %. PHYSICAL EXAM: Physical Exam GEN appears anxious and tremulous HEENT EOMI PERRL.  MMM, small lesion L vermillion border NECK no JVD PULM clear bilaterally CV tachycardic, II/VI systolic murmur ABD soft, nontender, no CVA tenderness EXT trace LE edema NEURO AAO x 3 somewhat shaky SKIN + seborrheic keratoses   Results for orders placed or performed during the hospital encounter of 08/23/21 (from the past 48 hour(s))  Basic metabolic panel     Status: Abnormal   Collection Time: 08/23/21  8:20 AM  Result Value Ref Range   Sodium 138 135 - 145 mmol/L   Potassium 4.4 3.5 - 5.1 mmol/L   Chloride 111 98 - 111 mmol/L   CO2 16 (L) 22 - 32 mmol/L   Glucose, Bld 97 70 - 99 mg/dL    Comment: Glucose reference range applies only to samples taken after fasting for at least 8 hours.   BUN 44 (H) 8 - 23 mg/dL   Creatinine, Ser 6.56 (H) 0.61 - 1.24 mg/dL   Calcium 8.0 (L)  8.9 - 10.3 mg/dL   GFR, Estimated 8 (L) >60 mL/min    Comment: (NOTE) Calculated using the CKD-EPI Creatinine Equation (2021)    Anion gap 11 5 - 15    Comment: Performed at [facility], [place] [address], [place], [place] [address]  CBC     Status: Abnormal   Collection Time: 08/23/21  8:20 AM  Result Value Ref Range   WBC 14.2 (H) 4.0 - 10.5 K/uL   RBC 2.58 (L) 4.22 - 5.81 MIL/uL   Hemoglobin 6.0 (LL) 13.0 - 17.0 g/dL    Comment: REPEATED TO VERIFY THIS CRITICAL RESULT HAS VERIFIED AND BEEN CALLED TO BANKS,A RN BY AMANDA LEONARD ON 09 01 2022 AT 0913, AND HAS BEEN READ BACK.     HCT 20.5 (L) 39.0 - 52.0 %   MCV 79.5 (L) 80.0 - 100.0 fL   MCH 23.3 (L) 26.0 - 34.0 pg   MCHC 29.3 (L) 30.0 - 36.0 g/dL   RDW 19.7 (H) 11.5 -  15.5 %   Platelets 421 (H) 150 - 400 K/uL   nRBC 0.0 0.0 - 0.2 %    Comment: Performed at [facility] [address] [place], [place] [address]  Troponin I (High Sensitivity)     Status: Abnormal   Collection Time: 08/23/21  8:20 AM  Result Value Ref Range   Troponin I (High Sensitivity) 611 (HH) <18 ng/L    Comment: CRITICAL RESULT CALLED TO, READ BACK BY AND VERIFIED WITH: A BANKS RN 08/23/21 1008 M KOROLESKI (NOTE) Elevated high sensitivity troponin I (hsTnI) values and significant  changes across serial measurements may suggest ACS but many other  chronic and acute conditions are known to elevate hsTnI results.  Refer to the Links section for chest pain algorithms and additional  guidance. Performed at [facility], [place] [address], [place], [place] [address]   Type and screen [place]     Status: None (Preliminary result)   Collection Time: 08/23/21 10:06 AM  Result Value Ref Range   ABO/RH(D) A NEG    Antibody Screen NEG    Sample Expiration 08/26/2021,2359    Unit Number [id number]    Blood Component Type RBC LR PHER2    Unit division 00    Status of Unit ISSUED    Transfusion Status OK TO TRANSFUSE    Crossmatch Result      Compatible Performed at [facility], [place] [address], [place], [place] [address]   ABO/Rh     Status: None   Collection Time: 08/23/21 10:15 AM  Result Value Ref Range   ABO/RH(D)      A NEG Performed at [facility] [address], [place], [place] [address]   Troponin I (High Sensitivity)     Status: Abnormal   Collection Time: 08/23/21 10:25 AM  Result Value Ref Range   Troponin I (High Sensitivity) 649 (HH) <18 ng/L    Comment: CRITICAL VALUE NOTED.  VALUE IS CONSISTENT WITH PREVIOUSLY REPORTED AND CALLED VALUE. (NOTE) Elevated high sensitivity troponin I (hsTnI) values and significant  changes across serial measurements may suggest ACS but many other  chronic and acute conditions are  known to elevate hsTnI results.  Refer to the Links section for chest pain algorithms and additional  guidance. Performed at [facility], [name] [address] [place],  [address]   Hepatic function panel     Status: Abnormal  Collection Time: 08/23/21 10:37 AM  Result Value Ref Range   Total Protein 5.6 (L) 6.5 - 8.1 g/dL   Albumin 2.2 (L) 3.5 - 5.0 g/dL   AST 17 15 - 41 U/L   ALT 10 0 - 44 U/L   Alkaline Phosphatase 66 38 - 126 U/L   Total Bilirubin 0.3 0.3 - 1.2 mg/dL   Bilirubin, Direct <0.1 0.0 - 0.2 mg/dL   Indirect Bilirubin NOT CALCULATED 0.3 - 0.9 mg/dL    Comment: Performed at [facility] [address], [place], [place] [address]  Magnesium     Status: Abnormal   Collection Time: 08/23/21 10:37 AM  Result Value Ref Range   Magnesium 1.6 (L) 1.7 - 2.4 mg/dL    Comment: Performed at [facility] [address], [place], [place] [address]  Lipase, blood     Status: Abnormal   Collection Time: 08/23/21 10:37 AM  Result Value Ref Range   Lipase 70 (H) 11 - 51 U/L    Comment: Performed at [facility], [place] [address], [place], [place] [address]  Lactate dehydrogenase     Status: Abnormal   Collection Time: 08/23/21 10:37 AM  Result Value Ref Range   LDH 204 (H) 98 - 192 U/L    Comment: Performed at [facility], [place] [address], [place], [place] [address]  D-dimer, quantitative     Status: Abnormal   Collection Time: 08/23/21 10:37 AM  Result Value Ref Range   D-Dimer, Quant 1.61 (H) 0.00 - 0.50 ug/mL-FEU    Comment: (NOTE) At the manufacturer cut-off value of 0.5 g/mL FEU, this assay has a negative predictive value of 95-100%.This assay is intended for use in conjunction with a clinical pretest probability (PTP) assessment model to exclude pulmonary embolism (PE) and deep venous thrombosis (DVT) in outpatients suspected of PE or DVT. Results should be correlated with clinical presentation. Performed at [facility], [place] [address] [place], [place] [address]   Urinalysis, Routine w reflex microscopic     Status: Abnormal   Collection Time: 08/23/21 11:40 AM  Result Value Ref Range   Color, Urine STRAW (A) YELLOW   APPearance CLEAR CLEAR   Specific Gravity, Urine 1.008 1.005 - 1.030   pH 6.0 5.0 - 8.0   Glucose, UA 50 (A) NEGATIVE mg/dL   Hgb urine dipstick SMALL (A) NEGATIVE   Bilirubin Urine NEGATIVE NEGATIVE   Ketones, ur NEGATIVE NEGATIVE mg/dL   Protein, ur >=300 (A) NEGATIVE mg/dL   Nitrite NEGATIVE NEGATIVE   Leukocytes,Ua NEGATIVE NEGATIVE   RBC / HPF 11-20 0 - 5 RBC/hpf   WBC, UA 0-5 0 - 5 WBC/hpf   Bacteria, UA NONE SEEN NONE SEEN   Squamous Epithelial / LPF 0-5 0 - 5   Mucus PRESENT     Comment: Performed at [facility] [address], [place], [place] [address]  POC occult blood, ED     Status: None   Collection Time: 08/23/21 11:50 AM  Result Value Ref Range   Fecal Occult Bld NEGATIVE NEGATIVE  Prepare RBC (crossmatch)     Status: None   Collection Time: 08/23/21 12:09 PM  Result Value Ref Range   Order Confirmation      ORDER PROCESSED BY BLOOD BANK Performed at [facility], [place] [address] [place], [place] [address]   Hepatitis B surface antigen     Status: None   Collection Time:  08/23/21 12:41 PM  Result Value Ref Range   Hepatitis B Surface Ag NON REACTIVE NON REACTIVE    Comment: Performed at [facility] [address] [place], [place] [address]    DG Chest 2 View  Result Date: 08/23/2021 CLINICAL DATA:  Near syncope. EXAM: CHEST - 2 VIEW COMPARISON:  None. FINDINGS: The lungs are clear without focal pneumonia, edema, pneumothorax or pleural effusion. Cardiopericardial silhouette is at upper limits of normal for size. The visualized bony structures of the thorax show no acute abnormality. Telemetry leads overlie the chest. IMPRESSION: No active cardiopulmonary disease. Electronically Signed   By: Misty Stanley M.D.   On: 08/23/2021 09:15     Assessment/Plan   AKI: no history of renal function issues.  AKI with Cr 6.95.  Known history of nephrolithiasis- CT scan pending. UA with > 300 mg protein and 11-20 RBCs/ hpf.  If no obstruction will need serologic workup and biopsy--> have ordered ANA, ANCA, C3/4, SPEP, anti-GBM, ds DNA, Hep B/C serologies, HIV.  Discussed BC powders--> avoid all NSAIDs.  Hopeful that this is all acute and reversible.  No acute indication for HD yet but will place NPO past MN in case a biopsy vs TDC is needed   Anemia: FOBT negative, getting pRBCs.  Added on iron panel.  May need IV iron +/- ESA.  Likely the cause of his near syncope- EKG OK and trops flat. Elevated d-dimer: getting V/Q scan Nephrolithiasis: sees Dr Diona Fanti- need records from [facility] Metabolic acidosis: will start sodium bicarb PO Dispo: admitted  [place], Benjamine Mola 08/23/2021, 2:55 PM

## 2021-08-23 NOTE — ED Notes (Signed)
I resent pt's H&H down because they used the tube for the BNP. Pt given a sack lunch. Denies further needs.

## 2021-08-23 NOTE — ED Notes (Signed)
Attempted to call report on the pt. They said they haven't looked at the chart yet and they'd call me back.

## 2021-08-23 NOTE — ED Notes (Signed)
Pt transported to floor via RN via stretcher on monitor.

## 2021-08-23 NOTE — ED Notes (Signed)
Introduced myself to pt and daughter at bedside. Pt GCS 15. VSS. Pt is shakey, said that's been going on since this morning. Didn't want nicotine patch, said he might want it later. Denies pain.Given more ice water. Denies further needs.

## 2021-08-23 NOTE — ED Notes (Signed)
Blood drawn and sent down. Pt denies further needs.

## 2021-08-23 NOTE — H&P (Addendum)
History and Physical    CONO GEBHARD UEA:[id number] DOB: 1951-12-05 DOA: 08/23/2021  I have briefly reviewed the patient's prior medical records in [place]  PCP: Pcp, No  Patient coming from: home  Chief Complaint: generalized weakness, shortness of breath  HPI: Caleb Simmons is a 70 y.o. male with medical history significant of nephrolithiasis folowed by Dr Diona Fanti with [facility], no other medical problems as he does not have a PCP and does not regularly see a primary MD comes to the hospital with complaints of generalized weakness, shortness of breath.  He tells me that over the last week he has been having lightheadedness, dyspnea on exertion.  He also reports that he had a sore throat and a cough about a week ago, he had that checked in urgent care, tested negative for COVID and was given symptomatic treatment.  He continues to feel congested, weak and eventually decided to come to the hospital.  He also reports a decreased appetite and change in taste over the last week. No significant dehydration, in fact he feels well hydrated.  He denies any fever or chills, no abdominal pain, no nausea or vomiting.  He also reports blurry vision this morning which is now resolved.  He denies alcohol intake.  He smokes about half a pack a day. He uses BC powder regularly.  He denies any blood in his stool, but occasionally saw dark stools which he relates to certain foods that he ate.    ED Course: In the emergency room he is afebrile, normotensive.  He is satting 100% on room air. His blood work is pertinent for a BUN of 44 and a creatinine of 6.5, bicarb of 16.  He was found to have a hemoglobin of 6.0, WBC 14.2 and a high-sensitivity troponin 611--649.  A D-dimer was elevated at 1.6 and underwent a VQ scan which was negative for PE.  His fecal occult was negative.  Chest x-ray on admission was without acute findings, EKG showed sinus rhythm  Review of Systems: All systems reviewed, and apart  from HPI, all negative  History reviewed. No pertinent past medical history.  Past Surgical History:  Procedure Laterality Date   APPENDECTOMY       reports that he has been smoking cigarettes. He has a 6.25 pack-year smoking history. He has never used smokeless tobacco. No history on file for alcohol use and drug use.  No Known Allergies  History reviewed. No pertinent family history.  Prior to Admission medications   Medication Sig Start Date End Date Taking? Authorizing Provider  amoxicillin-clavulanate (AUGMENTIN) 875-125 MG tablet Take 1 tablet by mouth 2 (two) times daily. 08/14/21  Yes [provider]  Aspirin-Salicylamide-Caffeine (BC FAST PAIN RELIEF) 650-195-33.3 MG PACK Take 1 Package by mouth daily as needed (pain).   Yes [provider]  promethazine-dextromethorphan (PROMETHAZINE-DM) 6.25-15 MG/5ML syrup Take 5 mLs by mouth every 4 (four) hours as needed for cough. 08/14/21  Yes [provider]    Physical Exam: Vitals:   08/23/21 1436 08/23/21 1540 08/23/21 1600 08/23/21 1630  BP:  (!) 153/94 (!) 154/95 (!) 150/92  Pulse:  (!) 114 (!) 117 (!) 115  Resp:  (!) 24 19 (!) 22  Temp: 98 F (36.7 C)     TempSrc: Oral     SpO2:  98% 99% 96%  Weight:      Height:        Constitutional: NAD, calm, comfortable Eyes: PERRL, lids and conjunctivae  normal ENMT: Mucous membranes are moist.  Neck: normal, supple Respiratory: clear to auscultation bilaterally, no wheezing, no crackles. Normal respiratory effort. No accessory muscle use.  Cardiovascular: Regular rate and rhythm, no murmurs / rubs / gallops. No extremity edema. 2+ pedal pulses.  Abdomen: no tenderness, no masses palpated. Bowel sounds positive.  Musculoskeletal: no clubbing / cyanosis. Normal muscle tone.  Skin: no rashes, lesions, ulcers. No induration Neurologic: CN 2-12 grossly intact. Strength 5/5 in all 4.  Psychiatric: Normal judgment and insight. Alert and oriented x 3. Normal  mood.   Labs on Admission: I have personally reviewed following labs and imaging studies  CBC: Recent Labs  Lab 08/23/21 0820  WBC 14.2*  HGB 6.0*  HCT 20.5*  MCV 79.5*  PLT 850*   Basic Metabolic Panel: Recent Labs  Lab 08/23/21 0820 08/23/21 1037  NA 138  --   K 4.4  --   CL 111  --   CO2 16*  --   GLUCOSE 97  --   BUN 44*  --   CREATININE 6.56*  --   CALCIUM 8.0*  --   MG  --  1.6*   Liver Function Tests: Recent Labs  Lab 08/23/21 1037  AST 17  ALT 10  ALKPHOS 66  BILITOT 0.3  PROT 5.6*  ALBUMIN 2.2*   Coagulation Profile: No results for input(s): INR, PROTIME in the last 168 hours. BNP (last 3 results) No results for input(s): PROBNP in the last 8760 hours. CBG: No results for input(s): GLUCAP in the last 168 hours. Thyroid Function Tests: No results for input(s): TSH, T4TOTAL, FREET4, T3FREE, THYROIDAB in the last 72 hours. Urine analysis:    Component Value Date/Time   COLORURINE STRAW (A) 08/23/2021 1140   APPEARANCEUR CLEAR 08/23/2021 1140   LABSPEC 1.008 08/23/2021 1140   PHURINE 6.0 08/23/2021 1140   GLUCOSEU 50 (A) 08/23/2021 1140   HGBUR SMALL (A) 08/23/2021 1140   BILIRUBINUR NEGATIVE 08/23/2021 1140   KETONESUR NEGATIVE 08/23/2021 1140   PROTEINUR >=300 (A) 08/23/2021 1140   UROBILINOGEN 0.2 06/26/2007 1023   NITRITE NEGATIVE 08/23/2021 1140   LEUKOCYTESUR NEGATIVE 08/23/2021 1140     Radiological Exams on Admission: DG Chest 2 View  Result Date: 08/23/2021 CLINICAL DATA:  Near syncope. EXAM: CHEST - 2 VIEW COMPARISON:  None. FINDINGS: The lungs are clear without focal pneumonia, edema, pneumothorax or pleural effusion. Cardiopericardial silhouette is at upper limits of normal for size. The visualized bony structures of the thorax show no acute abnormality. Telemetry leads overlie the chest. IMPRESSION: No active cardiopulmonary disease. Electronically Signed   By: Misty Stanley M.D.   On: 08/23/2021 09:15   NM Pulmonary  Perfusion  Result Date: 08/23/2021 CLINICAL DATA:  PE suspected, shortness of breath EXAM: NUCLEAR MEDICINE PERFUSION LUNG SCAN TECHNIQUE: Perfusion images were obtained in multiple projections after intravenous injection of radiopharmaceutical. Ventilation scans intentionally deferred if perfusion scan and chest x-ray adequate for interpretation during COVID 19 epidemic. RADIOPHARMACEUTICALS:  4.2 mCi Tc-4m MAA IV COMPARISON:  Same-day chest radiographs FINDINGS: Normal, homogeneous pulmonary perfusion. No suspicious filling defects. IMPRESSION: Very low probability examination for pulmonary embolism by modified perfusion only PIOPED criteria (PE absent). Electronically Signed   By: Eddie Candle M.D.   On: 08/23/2021 15:59   CT Renal Stone Study  Result Date: 08/23/2021 CLINICAL DATA:  Hematuria, new renal failure EXAM: CT ABDOMEN AND PELVIS WITHOUT CONTRAST TECHNIQUE: Multidetector CT imaging of the abdomen and pelvis was performed following the standard protocol  without IV contrast. COMPARISON:  06/26/2007 FINDINGS: Lower chest: There is mild, scattered, nonspecific ground-glass and fine nodularity throughout the included bilateral lung bases (series 5, 4). Hepatobiliary: No solid liver abnormality is seen. No gallstones, gallbladder wall thickening, or biliary dilatation. Pancreas: Unremarkable. No pancreatic ductal dilatation or surrounding inflammatory changes. Spleen: Normal in size without significant abnormality. Adrenals/Urinary Tract: Adrenal glands are unremarkable. Kidneys are normal, without renal calculi, solid lesion, or hydronephrosis. Distended urinary bladder, measuring at least 17.4 cm. Stomach/Bowel: Stomach is within normal limits. Appendix not clearly visualized and may be surgically. No evidence of bowel wall thickening, distention, or inflammatory changes. Descending and sigmoid diverticulosis. Vascular/Lymphatic: Aortic atherosclerosis. No enlarged abdominal or pelvic lymph nodes.  Reproductive: No mass or other significant abnormality. Other: Small, fat containing bilateral inguinal hernias no abdominopelvic ascites. Musculoskeletal: No acute or significant osseous findings. IMPRESSION: 1. No evidence of urinary tract calculus or hydronephrosis. 2. Distended urinary bladder, measuring at least 17.4 cm. Correlate for urinary retention. 3. Descending and sigmoid diverticulosis without evidence of acute diverticulitis. 4. Mild, nonspecific scattered ground-glass and fine nodularity throughout the bilateral lung bases, possibly infectious or inflammatory. Aortic Atherosclerosis (ICD10-I70.0). Electronically Signed   By: Eddie Candle M.D.   On: 08/23/2021 16:04    Assessment/Plan  Principal Problem Acute kidney injury, metabolic acidosis -nephrology consulted, appreciate input.  There is no apparent obstructive cause given CT scan of the abdomen and pelvis without nephrolithiasis however he did have a distended bladder on imaging.  We will do a bladder scan since urinated after his CT, to evaluate for residual retention -Nephrology consulted and following, appreciate input -ANA, ANCA, C3/4, SPEP, ANCA GBM, double-stranded DNA, hepatitis serologies, HIV all ordered, will follow results. 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  His fecal occult was negative.  He was transfused a unit of packed RBC, follow CBC in the morning. -he does take BC powder at home, so we will place patient on a PPI  Elevated troponin -patient reports chest discomfort with coughing, but no ACS type symptoms.  This is likely demand ischemia in the setting of profound anemia as well as renal failure.  Will cycle troponin until flat/decreasing.  Given lack of outpatient follow-up, and elevated troponin, dyspnea with  exertion in the last week obtain a 2D echocardiogram  Tobacco use -counseled for cessation, start nicotine patch  Nephrolithiasis-CT scan renal stone done in the ED showed no evidence of urinary tract calculus or hydronephrosis but it but it did show a distended bladder. Bladder scan as above  Leukocytosis-monitor, he is afebrile, recently had a URI.  Possibly reactive due to acute illness  DVT prophylaxis: heparin  Code Status: Full code  Family Communication: no family at bedside  Disposition Plan: home when ready  Bed Type: telemetry  Consults called: Nephrology   Obs/Inp: Inpatient  At the time of admission, it appears that the appropriate admission status for this patient is INPATIENT as it is expected that patient will require hospital care > 2 midnights. This is judged to be reasonable and necessary in order to provide the required intensity of service to ensure the patient's safety given: presenting symptoms, initial radiographic and laboratory data and in the context of their chronic comorbidities. Together, these circumstances are felt to place patient at high at high risk for further clinical  deterioration threatening life, limb, or organ.  Marzetta Board, MD, PhD [facility]  Contact via [email or web address]  08/23/2021, 4:53 PM

## 2021-08-23 NOTE — ED Triage Notes (Addendum)
Pt BIB EMS from work, pt reports near syncopal episode prior to EMS arrival and another near syncopal episode w/EMS. EMS reports new tremors, syncopal episode included dizziness, blurred vision on left side, and pallor, pt reports feeling shaky and "I feel like I'm going to pass out, I am just scared," pt is tearful

## 2021-08-23 NOTE — ED Provider Notes (Signed)
[facility] EMERGENCY DEPARTMENT Provider Note   CSN: [id number] Arrival date & time: 08/23/21  0811     History Chief Complaint  Patient presents with   Near Syncope    Caleb Simmons is a 70 y.o. male.   Near Syncope  Caleb Simmons is 70 yo M presenting to the ED via EMS due to lightheadedness, weakness, dyspnea on exertion for 1 week and new-onset left sided blurry vision this morning which has now been resolved. During the onset of symptoms, he is able to sit down, and symptoms subside. He denies losing consciousness, enduring any falls, or head trauma. He remains asymptomatic in the ED, but is concerned about these new onset symptoms.   Pt complains of decreased appetite for 1 week from early satiety, but no changes in hydration and states that he feels well hydrated. Pt recently started amoxi-clav for bronchitis last week. Otherwise, he does not take any medications . He reports no other recent changes in his daily routine. Pt denies fevers, headaches, slurred speech, N/V, vomiting, and diarrhea. Pt states that his stool does appear darker. He has not been followed by any provider, and does not have any diagnosed past medical history.   History reviewed. No pertinent past medical history.  There are no problems to display for this patient.   Past Surgical History:  Procedure Laterality Date   APPENDECTOMY       History reviewed. No pertinent family history.  Social History   Tobacco Use   Smoking status: Some Days    Packs/day: 0.25    Years: 25.00    Pack years: 6.25    Types: Cigarettes   Smokeless tobacco: Never    Home Medications Prior to Admission medications   Not on File    Allergies    Patient has no known allergies.  Review of Systems    Negative ROS except as above.   Physical Exam Updated Vital Signs BP (!) 147/87   Pulse 84   Temp 97.8 F (36.6 C) (Oral)   Resp 14   Ht 5\' 10"  (1.778 m)   Wt 81.6 kg   SpO2 100%   BMI  25.83 kg/m   Physical Exam Constitutional: alert, well-appearing, in no acute distress HENT: normocephalic, atraumatic, mucous membranes dry Eyes: conjunctiva non-erythematous, extraocular movements intact Neck: supple Cardiovascular: regular rate and rhythm, no m/r/g Pulmonary/Chest: normal work of breathing on room air, lungs clear to auscultation bilaterally Abdominal: soft, non-tender to palpation, non-distended Rectal: no indication for hemorrhoids, and no frank blood seen  MSK: normal bulk and tone, no edema on extremities  Neurological: alert & oriented x 3, 5/5 strength in bilateral upper and lower extremities,  Skin: warm and dry, mildly reduced skin turgor  Psych: normal behavior, normal affect  ED Results / Procedures / Treatments   Labs (all labs ordered are listed, but only abnormal results are displayed) Labs Reviewed  CBC - Abnormal; Notable for the following components:      Result Value   WBC 14.2 (*)    RBC 2.58 (*)    Hemoglobin 6.0 (*)    HCT 20.5 (*)    MCV 79.5 (*)    MCH 23.3 (*)    MCHC 29.3 (*)    RDW 19.7 (*)    Platelets 421 (*)    All other components within normal limits  BASIC METABOLIC PANEL  D-DIMER, QUANTITATIVE  HEPATIC FUNCTION PANEL  MAGNESIUM  URINALYSIS, ROUTINE W REFLEX MICROSCOPIC  POC  OCCULT BLOOD, ED  TYPE AND SCREEN  TROPONIN I (HIGH SENSITIVITY)    EKG EKG Interpretation  Date/Time:  Thursday August 23 2021 08:15:47 EDT Ventricular Rate:  90 PR Interval:  189 QRS Duration: 85 QT Interval:  350 QTC Calculation: 429 R Axis:   49 Text Interpretation: Sinus rhythm Anteroseptal infarct, old Baseline wander unremarkable ecg Confirmed by Carmin Muskrat [phone] on 08/23/2021 9:30:53 AM  Radiology DG Chest 2 View  Result Date: 08/23/2021 CLINICAL DATA:  Near syncope. EXAM: CHEST - 2 VIEW COMPARISON:  None. FINDINGS: The lungs are clear without focal pneumonia, edema, pneumothorax or pleural effusion. Cardiopericardial  silhouette is at upper limits of normal for size. The visualized bony structures of the thorax show no acute abnormality. Telemetry leads overlie the chest. IMPRESSION: No active cardiopulmonary disease. Electronically Signed   By: Misty Stanley M.D.   On: 08/23/2021 09:15    Procedures Procedures   Medications Ordered in ED Medications - No data to display  ED Course  I have reviewed the triage vital signs and the nursing notes.  Pertinent labs & imaging results that were available during my care of the patient were reviewed by me and considered in my medical decision making (see chart for details).  Hbg 6.0, concerning for acute blood loss anemia. Started 1 unit pRBC transfusion and IV fluid bolus. FOB negative.  CBC with leukocytosis, but negative CXR negative and pt afebrile with no fevers or chills. Urinalysis without nitrites and luekocytes. Infection as source less likely. EKG SR and not concerning for arrhythmia. Uptrending trops 193 -> 649 but no complaints of CP, likely from demand ischemia. Lipase mildly elevated 70.   D-dimer elevated, V/Q scan negative.    BMP with BUN of 44 and Cr 6.56, with GFR 8. CT renal negative for obstruction. Given pt does not follow with any provider, potentially an acute on chronic renal failure presentation. Given negative FOB, anemia 2/2 CKD on ddx. U/A positive for proteinuria. Nephrology consulted.   Pt to be admitted to internal medicine service.   Clinical Course as of 08/23/21 1351  Thu Aug 23, 2021  1346 Lipase(!): 70 [MP]    Clinical Course User Index [MP] Lajean Manes, MD   MDM Rules/Calculators/A&P                           Symptomatic anemia 2/2 CKD, given negative FOB. Pt with weakness, SOB, and DOE, early satiety for 1 week, and has received 1 unit in the ED. Nephrology consulted, and will be admitted to medicine team.   Final Clinical Impression(s) / ED Diagnoses Final diagnoses:  None    Rx / DC Orders ED Discharge  Orders     None        Lajean Manes, MD 08/23/21 1651    Carmin Muskrat, MD 08/23/21 1722

## 2021-08-24 ENCOUNTER — Inpatient Hospital Stay (HOSPITAL_COMMUNITY): Payer: Commercial Managed Care - PPO

## 2021-08-24 DIAGNOSIS — R778 Other specified abnormalities of plasma proteins: Secondary | ICD-10-CM | POA: Diagnosis not present

## 2021-08-24 DIAGNOSIS — R9431 Abnormal electrocardiogram [ECG] [EKG]: Secondary | ICD-10-CM | POA: Diagnosis not present

## 2021-08-24 DIAGNOSIS — N179 Acute kidney failure, unspecified: Secondary | ICD-10-CM | POA: Diagnosis not present

## 2021-08-24 DIAGNOSIS — D649 Anemia, unspecified: Secondary | ICD-10-CM

## 2021-08-24 LAB — COMPREHENSIVE METABOLIC PANEL
ALT: 10 U/L (ref 0–44)
AST: 14 U/L — ABNORMAL LOW (ref 15–41)
Albumin: 1.9 g/dL — ABNORMAL LOW (ref 3.5–5.0)
Alkaline Phosphatase: 57 U/L (ref 38–126)
Anion gap: 11 (ref 5–15)
BUN: 41 mg/dL — ABNORMAL HIGH (ref 8–23)
CO2: 16 mmol/L — ABNORMAL LOW (ref 22–32)
Calcium: 7.7 mg/dL — ABNORMAL LOW (ref 8.9–10.3)
Chloride: 110 mmol/L (ref 98–111)
Creatinine, Ser: 6.12 mg/dL — ABNORMAL HIGH (ref 0.61–1.24)
GFR, Estimated: 9 mL/min — ABNORMAL LOW (ref >60)
Glucose, Bld: 93 mg/dL (ref 70–99)
Potassium: 4.5 mmol/L (ref 3.5–5.1)
Sodium: 137 mmol/L (ref 135–145)
Total Bilirubin: 0.4 mg/dL (ref 0.3–1.2)
Total Protein: 4.8 g/dL — ABNORMAL LOW (ref 6.5–8.1)

## 2021-08-24 LAB — CBC
HCT: 18.6 % — ABNORMAL LOW (ref 39.0–52.0)
Hemoglobin: 6 g/dL — CL (ref 13.0–17.0)
MCH: 25.3 pg — ABNORMAL LOW (ref 26.0–34.0)
MCHC: 32.3 g/dL (ref 30.0–36.0)
MCV: 78.5 fL — ABNORMAL LOW (ref 80.0–100.0)
Platelets: 310 10*3/uL (ref 150–400)
RBC: 2.37 MIL/uL — ABNORMAL LOW (ref 4.22–5.81)
RDW: 18.8 % — ABNORMAL HIGH (ref 11.5–15.5)
WBC: 17.2 10*3/uL — ABNORMAL HIGH (ref 4.0–10.5)
nRBC: 0 % (ref 0.0–0.2)

## 2021-08-24 LAB — ANCA TITERS
Atypical P-ANCA titer: <1:20 {titer}
C-ANCA: <1:20 {titer}
P-ANCA: <1:20 {titer}

## 2021-08-24 LAB — PROTEIN / CREATININE RATIO, URINE
Creatinine, Urine: 102.1 mg/dL
Protein Creatinine Ratio: 0.16 mg/mg{Cre} — ABNORMAL HIGH (ref 0.00–0.15)
Total Protein, Urine: 16 mg/dL

## 2021-08-24 LAB — PROTIME-INR
INR: 1.6 — ABNORMAL HIGH (ref 0.8–1.2)
Prothrombin Time: 19.1 seconds — ABNORMAL HIGH (ref 11.4–15.2)

## 2021-08-24 LAB — HEPATITIS B SURFACE ANTIBODY, QUANTITATIVE: Hep B S AB Quant (Post): <3.1 m[IU]/mL — ABNORMAL LOW (ref >9.9)

## 2021-08-24 LAB — PREPARE RBC (CROSSMATCH)

## 2021-08-24 LAB — ECHOCARDIOGRAM COMPLETE
Area-P 1/2: 3.85 cm2
Height: 70 in
S' Lateral: 3.6 cm
Single Plane A4C EF: 61.5 %
Weight: 2880 oz

## 2021-08-24 LAB — PHOSPHORUS: Phosphorus: 5.6 mg/dL — ABNORMAL HIGH (ref 2.5–4.6)

## 2021-08-24 LAB — MAGNESIUM: Magnesium: 1.5 mg/dL — ABNORMAL LOW (ref 1.7–2.4)

## 2021-08-24 LAB — ANA W/REFLEX IF POSITIVE: Anti Nuclear Antibody (ANA): NEGATIVE

## 2021-08-24 LAB — C4 COMPLEMENT: Complement C4, Body Fluid: 17 mg/dL (ref 12–38)

## 2021-08-24 LAB — GLOMERULAR BASEMENT MEMBRANE ANTIBODIES: GBM Ab: <0.2 units (ref 0.0–0.9)

## 2021-08-24 LAB — C3 COMPLEMENT: C3 Complement: 134 mg/dL (ref 82–167)

## 2021-08-24 LAB — HEMOGLOBIN AND HEMATOCRIT, BLOOD
HCT: 22.9 % — ABNORMAL LOW (ref 39.0–52.0)
Hemoglobin: 7.1 g/dL — ABNORMAL LOW (ref 13.0–17.0)

## 2021-08-24 LAB — ANTI-DNA ANTIBODY, DOUBLE-STRANDED: ds DNA Ab: <1 IU/mL (ref 0–9)

## 2021-08-24 MED ORDER — CHLORHEXIDINE GLUCONATE CLOTH 2 % EX PADS
6.0000 | MEDICATED_PAD | Freq: Every day | CUTANEOUS | Status: DC
  Administered 2021-08-24 – 2021-08-25 (×2): 6 via TOPICAL

## 2021-08-24 MED ORDER — SODIUM CHLORIDE 0.9% IV SOLUTION: Freq: Once | INTRAVENOUS | Status: AC

## 2021-08-24 MED ORDER — NA FERRIC GLUC CPLX IN SUCROSE 12.5 MG/ML IV SOLN
250.0000 mg | Freq: Every day | INTRAVENOUS | Status: AC
Start: 2021-08-24 — End: 2021-08-27
  Administered 2021-08-24 – 2021-08-27 (×4): 250 mg via INTRAVENOUS
  Filled 2021-08-24 (×4): qty 20

## 2021-08-24 MED ORDER — SODIUM BICARBONATE 650 MG PO TABS
1300.0000 mg | ORAL_TABLET | Freq: Three times a day (TID) | ORAL | Status: DC
  Administered 2021-08-24 – 2021-09-19 (×76): 1300 mg via ORAL
  Filled 2021-08-24 (×76): qty 2

## 2021-08-24 MED ORDER — MELATONIN 5 MG PO TABS
5.0000 mg | ORAL_TABLET | Freq: Every day | ORAL | Status: DC
  Administered 2021-08-24 – 2021-09-23 (×31): 5 mg via ORAL
  Filled 2021-08-24 (×31): qty 1

## 2021-08-24 MED ORDER — MAGNESIUM SULFATE 2 GM/50ML IV SOLN
2.0000 g | Freq: Once | INTRAVENOUS | Status: AC
  Administered 2021-08-24: 2 g via INTRAVENOUS
  Filled 2021-08-24: qty 50

## 2021-08-24 MED ORDER — ALPRAZOLAM 0.25 MG PO TABS
0.2500 mg | ORAL_TABLET | Freq: Three times a day (TID) | ORAL | Status: DC | PRN
  Administered 2021-08-24 – 2021-09-24 (×43): 0.25 mg via ORAL
  Filled 2021-08-24 (×45): qty 1

## 2021-08-24 MED ORDER — HYDROXYZINE HCL 10 MG PO TABS
10.0000 mg | ORAL_TABLET | Freq: Three times a day (TID) | ORAL | Status: DC | PRN
  Administered 2021-08-24: 10 mg via ORAL
  Filled 2021-08-24: qty 1

## 2021-08-24 NOTE — Progress Notes (Signed)
PROGRESS NOTE        PATIENT DETAILS Name: Caleb Simmons Age: 70 y.o. Sex: male Date of Birth: Mar 04, 1951 Admit Date: 08/23/2021 Admitting Physician Costin Karlyne Greenspan, MD PCP:Pcp, No  Brief Narrative: Patient is a 70 y.o. male with history of nephrolithiasis-presenting with generalized weakness/shortness of breath-found to have AKI and severe normocytic anemia.  Admitted to the hospitalist service for further evaluation and treatment.  Significant events: 9/1>> admit for evaluation of AKI/severe anemia.  Significant studies: 9/1>> CXR: No pneumonia 9/1>> VQ scan: No PE 9/1>> CT renal stone study: No hydronephrosis, distended bladder.  Diverticulosis. 9/1>> FOBT: Negative 9/1>> UA: Protein>> 300, RBCs 11-20/hpf 9/1>> HIV: Nonreactive 9/1>> HBsAg/HCV Ab: Nonreactive 9/1>> C3/C4: Normal limits  Antimicrobial therapy: None  Microbiology data: 9/1>> COVID PCR: Negative  Procedures : None  Consults: Nephrology, IR  DVT Prophylaxis : Place and maintain sequential compression device Start: 08/24/21 0519   Subjective: Anxious today about hospitalization-AKI.  Assessment/Plan: Acute kidney injury: Suspicion for glomerular pathology given significant proteinuria/RBC in UA.  No hydronephrosis on CT abdomen.  Extensive serological work-up in progress-IR consulted for renal biopsy.  Electrolytes relatively stable-good urine output-no emergent indications for dialysis.  Avoid nephrotoxic agents and follow electrolytes/renal function.  Await further recommendations from nephrology.  Intake/Output Summary Gross per 24 hour  Intake 3449.95 ml  Output 3175 ml  Net 274.95 ml    Microcytic anemia: Appears multifactorial-iron deficiency and anemia due to kidney failure.  Getting 2nd unit of PRBC this morning-follow posttransfusion CBC.  FOBT negative.  No overt blood loss as well.  IV iron/erythropoietin injections deferred to nephrology.  Minimally elevated  troponins: Trend is flat-not consistent with ACS-he does not have any chest pain.  Exertional dyspnea was from symptomatic anemia.  Suspect elevated troponin is in the setting of kidney failure.  Await echo to assess EF/wall motion.  Given his asymptomatic-and has AKI-severity of anemia-doubt further work-up with LHC or antiplatelet agents indicated.  Anxiety: Reassurance provided-we will use as needed Atarax.  Tobacco abuse: Start transdermal nicotine-counseled prior to discharge.  Diet: Diet Order             Diet NPO time specified Except for: Sips with Meds, Ice Chips  Diet effective now                    Code Status: Full code or DNR  Family Communication: Daughter-in-law at bedside at bedside  Disposition Plan: Status is: Inpatient  Remains inpatient appropriate because:Inpatient level of care appropriate due to severity of illness  Dispo: The patient is from: Home              Anticipated d/c is to: Home              Patient currently is not medically stable to d/c.   Difficult to place patient No     Barriers to Discharge: Severe AKI-severe anemia requiring PRBC transfusion-extensive serological work-up pending-renal biopsy ordered.  Clearly not stable for discharge at this point.  Antimicrobial agents: Anti-infectives (From admission, onward)    None        Time spent: 35 minutes-Greater than 50% of this time was spent in counseling, explanation of diagnosis, planning of further management, and coordination of care.  MEDICATIONS: Scheduled Meds:  Chlorhexidine Gluconate Cloth  6 each Topical Daily   melatonin  5  mg Oral QHS   nicotine  14 mg Transdermal Q24H   pantoprazole  40 mg Oral BID AC   sodium bicarbonate  1,300 mg Oral BID   Continuous Infusions:  sodium chloride     sodium chloride 75 mL/hr at 08/23/21 2347   PRN Meds:.bisacodyl, hydrOXYzine, ondansetron **OR** ondansetron (ZOFRAN) IV, promethazine, technetium albumin  aggregated   PHYSICAL EXAM: Vital signs: Vitals:   08/24/21 0644 08/24/21 0704 08/24/21 0746 08/24/21 0957  BP: 116/70 116/70 120/69 127/75  Pulse: 83 85 85 77  Resp: 14 16 12 18   Temp: 98.8 F (37.1 C) 98 F (36.7 C) 98.2 F (36.8 C) 98.1 F (36.7 C)  TempSrc: Oral Oral Oral Oral  SpO2: 96% 99% 96% 97%  Weight:      Height:       Filed Weights   08/23/21 0816  Weight: 81.6 kg   Body mass index is 25.83 kg/m.   Gen Exam:Alert awake-not in any distress HEENT:atraumatic, normocephalic Chest: B/L clear to auscultation anteriorly CVS:S1S2 regular Abdomen:soft non tender, non distended Extremities:no edema Neurology: Non focal Skin: no rash  I have personally reviewed following labs and imaging studies  LABORATORY DATA: CBC: Recent Labs  Lab 08/23/21 0820 08/23/21 2018 08/24/21 0055  WBC 14.2*  --  17.2*  HGB 6.0* 6.5* 6.0*  HCT 20.5* 21.7* 18.6*  MCV 79.5*  --  78.5*  PLT 421*  --  382    Basic Metabolic Panel: Recent Labs  Lab 08/23/21 0820 08/23/21 1037 08/24/21 0055  NA 138  --  137  K 4.4  --  4.5  CL 111  --  110  CO2 16*  --  16*  GLUCOSE 97  --  93  BUN 44*  --  41*  CREATININE 6.56*  --  6.12*  CALCIUM 8.0*  --  7.7*  MG  --  1.6* 1.5*  PHOS  --   --  5.6*    GFR: Estimated Creatinine Clearance: 11.4 mL/min (A) (by C-G formula based on SCr of 6.12 mg/dL (H)).  Liver Function Tests: Recent Labs  Lab 08/23/21 1037 08/24/21 0055  AST 17 14*  ALT 10 10  ALKPHOS 66 57  BILITOT 0.3 0.4  PROT 5.6* 4.8*  ALBUMIN 2.2* 1.9*   Recent Labs  Lab 08/23/21 1037  LIPASE 70*   No results for input(s): AMMONIA in the last 168 hours.  Coagulation Profile: No results for input(s): INR, PROTIME in the last 168 hours.  Cardiac Enzymes: No results for input(s): CKTOTAL, CKMB, CKMBINDEX, TROPONINI in the last 168 hours.  BNP (last 3 results) No results for input(s): PROBNP in the last 8760 hours.  Lipid Profile: No results for  input(s): CHOL, HDL, LDLCALC, TRIG, CHOLHDL, LDLDIRECT in the last 72 hours.  Thyroid Function Tests: No results for input(s): TSH, T4TOTAL, FREET4, T3FREE, THYROIDAB in the last 72 hours.  Anemia Panel: Recent Labs    08/23/21 1703  FERRITIN 20*  TIBC 350  IRON 12*    Urine analysis:    Component Value Date/Time   COLORURINE STRAW (A) 08/23/2021 1140   APPEARANCEUR CLEAR 08/23/2021 1140   LABSPEC 1.008 08/23/2021 1140   PHURINE 6.0 08/23/2021 1140   GLUCOSEU 50 (A) 08/23/2021 1140   HGBUR SMALL (A) 08/23/2021 1140   BILIRUBINUR NEGATIVE 08/23/2021 [place] 08/23/2021 1140   PROTEINUR >=300 (A) 08/23/2021 1140   UROBILINOGEN 0.2 06/26/2007 1023   NITRITE NEGATIVE 08/23/2021 [place] 08/23/2021 1140  Sepsis Labs: Lactic Acid, Venous No results found for: LATICACIDVEN  MICROBIOLOGY: Recent Results (from the past 240 hour(s))  SARS CORONAVIRUS 2 (TAT 6-24 HRS) Nasopharyngeal Nasopharyngeal Swab     Status: None   Collection Time: 08/23/21  5:11 PM   Specimen: Nasopharyngeal Swab  Result Value Ref Range Status   SARS Coronavirus 2 NEGATIVE NEGATIVE Final    Comment: (NOTE) SARS-CoV-2 target nucleic acids are NOT DETECTED.  The SARS-CoV-2 RNA is generally detectable in upper and lower respiratory specimens during the acute phase of infection. Negative results do not preclude SARS-CoV-2 infection, do not rule out co-infections with other pathogens, and should not be used as the sole basis for treatment or other patient management decisions. Negative results must be combined with clinical observations, patient history, and epidemiological information. The expected result is Negative.  Fact Sheet for Patients: SugarRoll.be  Fact Sheet for Healthcare Providers: https://www.woods-mathews.com/  This test is not yet approved or cleared by the [place] FDA and  has been authorized for  detection and/or diagnosis of SARS-CoV-2 by FDA under an Emergency Use Authorization (EUA). This EUA will remain  in effect (meaning this test can be used) for the duration of the COVID-19 declaration under Se ction 564(b)(1) of the Act, 21 U.S.C. section 360bbb-3(b)(1), unless the authorization is terminated or revoked sooner.  Performed at [facility], [place] [address], [place], [place] [address]     RADIOLOGY STUDIES/RESULTS: DG Chest 2 View  Result Date: 08/23/2021 CLINICAL DATA:  Near syncope. EXAM: CHEST - 2 VIEW COMPARISON:  None. FINDINGS: The lungs are clear without focal pneumonia, edema, pneumothorax or pleural effusion. Cardiopericardial silhouette is at upper limits of normal for size. The visualized bony structures of the thorax show no acute abnormality. Telemetry leads overlie the chest. IMPRESSION: No active cardiopulmonary disease. Electronically Signed   By: Misty Stanley M.D.   On: 08/23/2021 09:15   NM Pulmonary Perfusion  Result Date: 08/23/2021 CLINICAL DATA:  PE suspected, shortness of breath EXAM: NUCLEAR MEDICINE PERFUSION LUNG SCAN TECHNIQUE: Perfusion images were obtained in multiple projections after intravenous injection of radiopharmaceutical. Ventilation scans intentionally deferred if perfusion scan and chest x-ray adequate for interpretation during COVID 19 epidemic. RADIOPHARMACEUTICALS:  4.2 mCi Tc-80m MAA IV COMPARISON:  Same-day chest radiographs FINDINGS: Normal, homogeneous pulmonary perfusion. No suspicious filling defects. IMPRESSION: Very low probability examination for pulmonary embolism by modified perfusion only PIOPED criteria (PE absent). Electronically Signed   By: Eddie Candle M.D.   On: 08/23/2021 15:59   CT Renal Stone Study  Result Date: 08/23/2021 CLINICAL DATA:  Hematuria, new renal failure EXAM: CT ABDOMEN AND PELVIS WITHOUT CONTRAST TECHNIQUE: Multidetector CT imaging of the abdomen and pelvis was performed following the standard  protocol without IV contrast. COMPARISON:  06/26/2007 FINDINGS: Lower chest: There is mild, scattered, nonspecific ground-glass and fine nodularity throughout the included bilateral lung bases (series 5, 4). Hepatobiliary: No solid liver abnormality is seen. No gallstones, gallbladder wall thickening, or biliary dilatation. Pancreas: Unremarkable. No pancreatic ductal dilatation or surrounding inflammatory changes. Spleen: Normal in size without significant abnormality. Adrenals/Urinary Tract: Adrenal glands are unremarkable. Kidneys are normal, without renal calculi, solid lesion, or hydronephrosis. Distended urinary bladder, measuring at least 17.4 cm. Stomach/Bowel: Stomach is within normal limits. Appendix not clearly visualized and may be surgically. No evidence of bowel wall thickening, distention, or inflammatory changes. Descending and sigmoid diverticulosis. Vascular/Lymphatic: Aortic atherosclerosis. No enlarged abdominal or pelvic lymph nodes. Reproductive: No mass or other significant abnormality. Other:  Small, fat containing bilateral inguinal hernias no abdominopelvic ascites. Musculoskeletal: No acute or significant osseous findings. IMPRESSION: 1. No evidence of urinary tract calculus or hydronephrosis. 2. Distended urinary bladder, measuring at least 17.4 cm. Correlate for urinary retention. 3. Descending and sigmoid diverticulosis without evidence of acute diverticulitis. 4. Mild, nonspecific scattered ground-glass and fine nodularity throughout the bilateral lung bases, possibly infectious or inflammatory. Aortic Atherosclerosis (ICD10-I70.0). Electronically Signed   By: Eddie Candle M.D.   On: 08/23/2021 16:04     LOS: 1 day   Oren Binet, MD  [facility]    To contact the attending provider between 7A-7P or the covering provider during after hours 7P-7A, please log into the web site [email or web address] and access using universal [place] password for that web site. If you do not  have the password, please call the hospital operator.  08/24/2021, 10:55 AM

## 2021-08-24 NOTE — Progress Notes (Signed)
  Echocardiogram 2D Echocardiogram has been performed.  Caleb Simmons 08/24/2021, 9:28 AM

## 2021-08-24 NOTE — Progress Notes (Signed)
[facility] Progress Note    Assessment/ Plan:    AKI: no history of renal function issues, does have a history of excessive NSAID use (BC powders daily).  AKI with Cr 6.95.  Known history of nephrolithiasis- CT scan without obstruction/stones. UA with > 300 mg protein and 11-20 RBCs/ hpf.  ANA, ANCA, C3/4, SPEP, anti-GBM, ds DNA, Hep B/C serologies, HIV-pending.   Given hematuria without stones/obstruction, worsening anemia, AKI, sinus issues-there is a concern for a vasculitis picture, will proceed with native kidney biopsy which patient and daughter in law agree with No indication for renal replacement therapy at this time Daily labs, strict I/O Anemia: FOBT negative, getting pRBCs. Likely the cause of his near syncope- EKG OK and trops flat. Iron deficient on iron panel. Ferrlecit ordered x 4 doses Elevated d-dimer: vq scan low probability of PE Nephrolithiasis: sees Dr Johney Frame obstruction on ct renal stone protocol Metabolic acidosis: on nahco3, increasing to tid Dispo: admitted  Gean Quint, MD [facility]   Subjective:   Anxious today, did not sleep overnight. Daughter in law at bedside. No new complaints. Urine output ~2.4L   Objective:   BP 127/75   Pulse 77   Temp 98.1 F (36.7 C) (Oral)   Resp 18   Ht 5\' 10"  (1.778 m)   Wt 81.6 kg   SpO2 97%   BMI 25.83 kg/m   Intake/Output Summary (Last 24 hours) at 08/24/2021 1117 Last data filed at 08/24/2021 2956 Gross per 24 hour  Intake 3449.95 ml  Output 3175 ml  Net 274.95 ml   Weight change:   Physical Exam: Gen:nad CVS:rrr Resp:cta bl OZH:YQMV, nt/nd Ext:no edema Skin: no rash Neuro: awake, alert  Imaging: DG Chest 2 View  Result Date: 08/23/2021 CLINICAL DATA:  Near syncope. EXAM: CHEST - 2 VIEW COMPARISON:  None. FINDINGS: The lungs are clear without focal pneumonia, edema, pneumothorax or pleural effusion. Cardiopericardial silhouette is at upper limits of normal for size.  The visualized bony structures of the thorax show no acute abnormality. Telemetry leads overlie the chest. IMPRESSION: No active cardiopulmonary disease. Electronically Signed   By: Misty Stanley M.D.   On: 08/23/2021 09:15   NM Pulmonary Perfusion  Result Date: 08/23/2021 CLINICAL DATA:  PE suspected, shortness of breath EXAM: NUCLEAR MEDICINE PERFUSION LUNG SCAN TECHNIQUE: Perfusion images were obtained in multiple projections after intravenous injection of radiopharmaceutical. Ventilation scans intentionally deferred if perfusion scan and chest x-ray adequate for interpretation during COVID 19 epidemic. RADIOPHARMACEUTICALS:  4.2 mCi Tc-20m MAA IV COMPARISON:  Same-day chest radiographs FINDINGS: Normal, homogeneous pulmonary perfusion. No suspicious filling defects. IMPRESSION: Very low probability examination for pulmonary embolism by modified perfusion only PIOPED criteria (PE absent). Electronically Signed   By: Eddie Candle M.D.   On: 08/23/2021 15:59   CT Renal Stone Study  Result Date: 08/23/2021 CLINICAL DATA:  Hematuria, new renal failure EXAM: CT ABDOMEN AND PELVIS WITHOUT CONTRAST TECHNIQUE: Multidetector CT imaging of the abdomen and pelvis was performed following the standard protocol without IV contrast. COMPARISON:  06/26/2007 FINDINGS: Lower chest: There is mild, scattered, nonspecific ground-glass and fine nodularity throughout the included bilateral lung bases (series 5, 4). Hepatobiliary: No solid liver abnormality is seen. No gallstones, gallbladder wall thickening, or biliary dilatation. Pancreas: Unremarkable. No pancreatic ductal dilatation or surrounding inflammatory changes. Spleen: Normal in size without significant abnormality. Adrenals/Urinary Tract: Adrenal glands are unremarkable. Kidneys are normal, without renal calculi, solid lesion, or hydronephrosis. Distended urinary bladder, measuring at least 17.4  cm. Stomach/Bowel: Stomach is within normal limits. Appendix not clearly  visualized and may be surgically. No evidence of bowel wall thickening, distention, or inflammatory changes. Descending and sigmoid diverticulosis. Vascular/Lymphatic: Aortic atherosclerosis. No enlarged abdominal or pelvic lymph nodes. Reproductive: No mass or other significant abnormality. Other: Small, fat containing bilateral inguinal hernias no abdominopelvic ascites. Musculoskeletal: No acute or significant osseous findings. IMPRESSION: 1. No evidence of urinary tract calculus or hydronephrosis. 2. Distended urinary bladder, measuring at least 17.4 cm. Correlate for urinary retention. 3. Descending and sigmoid diverticulosis without evidence of acute diverticulitis. 4. Mild, nonspecific scattered ground-glass and fine nodularity throughout the bilateral lung bases, possibly infectious or inflammatory. Aortic Atherosclerosis (ICD10-I70.0). Electronically Signed   By: Eddie Candle M.D.   On: 08/23/2021 16:04    Labs: BMET Recent Labs  Lab 08/23/21 0820 08/24/21 0055  NA 138 137  K 4.4 4.5  CL 111 110  CO2 16* 16*  GLUCOSE 97 93  BUN 44* 41*  CREATININE 6.56* 6.12*  CALCIUM 8.0* 7.7*  PHOS  --  5.6*   CBC Recent Labs  Lab 08/23/21 0820 08/23/21 2018 08/24/21 0055  WBC 14.2*  --  17.2*  HGB 6.0* 6.5* 6.0*  HCT 20.5* 21.7* 18.6*  MCV 79.5*  --  78.5*  PLT 421*  --  310    Medications:     Chlorhexidine Gluconate Cloth  6 each Topical Daily   melatonin  5 mg Oral QHS   nicotine  14 mg Transdermal Q24H   pantoprazole  40 mg Oral BID AC   sodium bicarbonate  1,300 mg Oral BID      Gean Quint, MD [facility] 08/24/2021, 11:17 AM

## 2021-08-24 NOTE — Consult Note (Signed)
Chief Complaint: AKI. Request is for native renal biopsy  Referring Physician(s): Dr. Loyal Gambler  Supervising Physician: Jacqulynn Cadet  Patient Status: [facility] - In-pt  History of Present Illness: Caleb Simmons is a 70 y.o. male Smoker. History of nephrolithiasis.. No other known medical history. Presented to the ED at [facility] on 9.1.22. with weakness, dyspnea and recently resolved blurry vision. Found to have AKI and hematuria. Team is requesting a native kidney biopsy for further evaluation.   Currently without any significant complaints. Patient alert and laying in bed, calm and comfortable. Denies any fevers, headache, chest pain, SOB, cough, abdominal pain, nausea, vomiting or bleeding. Daughter in law at bedside. Return precautions and treatment recommendations and follow-up discussed with the patient who is agreeable with the plan  History reviewed. No pertinent past medical history.  Past Surgical History:  Procedure Laterality Date   APPENDECTOMY      Allergies: Patient has no known allergies.  Medications: Prior to Admission medications   Medication Sig Start Date End Date Taking? Authorizing Provider  amoxicillin-clavulanate (AUGMENTIN) 875-125 MG tablet Take 1 tablet by mouth 2 (two) times daily. 08/14/21  Yes [provider]  Aspirin-Salicylamide-Caffeine (BC FAST PAIN RELIEF) 650-195-33.3 MG PACK Take 1 Package by mouth daily as needed (pain).   Yes [provider]  promethazine-dextromethorphan (PROMETHAZINE-DM) 6.25-15 MG/5ML syrup Take 5 mLs by mouth every 4 (four) hours as needed for cough. 08/14/21  Yes [provider]     History reviewed. No pertinent family history.  Social History   Socioeconomic History   Marital status: Married    Spouse name: Not on file   Number of children: Not on file   Years of education: Not on file   Highest education level: Not on file  Occupational History   Not on file  Tobacco Use   Smoking  status: Some Days    Packs/day: 0.25    Years: 25.00    Pack years: 6.25    Types: Cigarettes   Smokeless tobacco: Never  Substance and Sexual Activity   Alcohol use: Not on file   Drug use: Not on file   Sexual activity: Not on file  Other Topics Concern   Not on file  Social History Narrative   Not on file   Social Determinants of Health   Financial Resource Strain: Not on file  Food Insecurity: Not on file  Transportation Needs: Not on file  Physical Activity: Not on file  Stress: Not on file  Social Connections: Not on file    Review of Systems: A 12 point ROS discussed and pertinent positives are indicated in the HPI above.  All other systems are negative.  Review of Systems  Constitutional:  Negative for fever.  HENT:  Negative for congestion.   Respiratory:  Negative for cough and shortness of breath.   Cardiovascular:  Negative for chest pain.  Gastrointestinal:  Negative for abdominal pain.  Neurological:  Negative for headaches.  Psychiatric/Behavioral:  Negative for behavioral problems and confusion.    Vital Signs: BP 120/69 (BP Location: Right Arm)   Pulse 85   Temp 98.2 F (36.8 C) (Oral)   Resp 12   Ht 5\' 10"  (1.778 m)   Wt 180 lb (81.6 kg)   SpO2 96%   BMI 25.83 kg/m   Physical Exam Vitals and nursing note reviewed.  Constitutional:      Appearance: He is well-developed.  HENT:     Head: Normocephalic.  Cardiovascular:  Rate and Rhythm: Normal rate and regular rhythm.     Heart sounds: Normal heart sounds.  Pulmonary:     Effort: Pulmonary effort is normal.     Breath sounds: Normal breath sounds.  Musculoskeletal:        General: Normal range of motion.     Cervical back: Normal range of motion.  Skin:    General: Skin is dry.  Neurological:     Mental Status: He is alert and oriented to person, place, and time.    Imaging: DG Chest 2 View  Result Date: 08/23/2021 CLINICAL DATA:  Near syncope. EXAM: CHEST - 2 VIEW COMPARISON:   None. FINDINGS: The lungs are clear without focal pneumonia, edema, pneumothorax or pleural effusion. Cardiopericardial silhouette is at upper limits of normal for size. The visualized bony structures of the thorax show no acute abnormality. Telemetry leads overlie the chest. IMPRESSION: No active cardiopulmonary disease. Electronically Signed   By: Misty Stanley M.D.   On: 08/23/2021 09:15   NM Pulmonary Perfusion  Result Date: 08/23/2021 CLINICAL DATA:  PE suspected, shortness of breath EXAM: NUCLEAR MEDICINE PERFUSION LUNG SCAN TECHNIQUE: Perfusion images were obtained in multiple projections after intravenous injection of radiopharmaceutical. Ventilation scans intentionally deferred if perfusion scan and chest x-ray adequate for interpretation during COVID 19 epidemic. RADIOPHARMACEUTICALS:  4.2 mCi Tc-46m MAA IV COMPARISON:  Same-day chest radiographs FINDINGS: Normal, homogeneous pulmonary perfusion. No suspicious filling defects. IMPRESSION: Very low probability examination for pulmonary embolism by modified perfusion only PIOPED criteria (PE absent). Electronically Signed   By: Eddie Candle M.D.   On: 08/23/2021 15:59   CT Renal Stone Study  Result Date: 08/23/2021 CLINICAL DATA:  Hematuria, new renal failure EXAM: CT ABDOMEN AND PELVIS WITHOUT CONTRAST TECHNIQUE: Multidetector CT imaging of the abdomen and pelvis was performed following the standard protocol without IV contrast. COMPARISON:  06/26/2007 FINDINGS: Lower chest: There is mild, scattered, nonspecific ground-glass and fine nodularity throughout the included bilateral lung bases (series 5, 4). Hepatobiliary: No solid liver abnormality is seen. No gallstones, gallbladder wall thickening, or biliary dilatation. Pancreas: Unremarkable. No pancreatic ductal dilatation or surrounding inflammatory changes. Spleen: Normal in size without significant abnormality. Adrenals/Urinary Tract: Adrenal glands are unremarkable. Kidneys are normal, without  renal calculi, solid lesion, or hydronephrosis. Distended urinary bladder, measuring at least 17.4 cm. Stomach/Bowel: Stomach is within normal limits. Appendix not clearly visualized and may be surgically. No evidence of bowel wall thickening, distention, or inflammatory changes. Descending and sigmoid diverticulosis. Vascular/Lymphatic: Aortic atherosclerosis. No enlarged abdominal or pelvic lymph nodes. Reproductive: No mass or other significant abnormality. Other: Small, fat containing bilateral inguinal hernias no abdominopelvic ascites. Musculoskeletal: No acute or significant osseous findings. IMPRESSION: 1. No evidence of urinary tract calculus or hydronephrosis. 2. Distended urinary bladder, measuring at least 17.4 cm. Correlate for urinary retention. 3. Descending and sigmoid diverticulosis without evidence of acute diverticulitis. 4. Mild, nonspecific scattered ground-glass and fine nodularity throughout the bilateral lung bases, possibly infectious or inflammatory. Aortic Atherosclerosis (ICD10-I70.0). Electronically Signed   By: Eddie Candle M.D.   On: 08/23/2021 16:04    Labs:  CBC: Recent Labs    08/23/21 0820 08/23/21 2018 08/24/21 0055  WBC 14.2*  --  17.2*  HGB 6.0* 6.5* 6.0*  HCT 20.5* 21.7* 18.6*  PLT 421*  --  310    COAGS: No results for input(s): INR, APTT in the last 8760 hours.  BMP: Recent Labs    08/23/21 0820 08/24/21 0055  NA 138 137  K 4.4 4.5  CL 111 110  CO2 16* 16*  GLUCOSE 97 93  BUN 44* 41*  CALCIUM 8.0* 7.7*  CREATININE 6.56* 6.12*  GFRNONAA 8* 9*    LIVER FUNCTION TESTS: Recent Labs    08/23/21 1037 08/24/21 0055  BILITOT 0.3 0.4  AST 17 14*  ALT 10 10  ALKPHOS 66 57  PROT 5.6* 4.8*  ALBUMIN 2.2* 1.9*     Assessment and Plan:  70 y.o. male inpatient. Smoker. History of nephrolithiasis.. No other known medical history. Presented to the ED at [facility] on 9.1.22. with weakness, dyspnea and recently resolved blurry vision. Found to have AKI  and hematuria. Team is requesting a native kidney biopsy for further evaluation.   CT Abd pelvis  from 9.1.22 reads Kidneys are normal, without renal calculi, solid lesion, or hydronephrosis. WBC is 17.2. Hgb 6.0, BNP 13000, troponin 668. Patient is on subcutaneous prophylactic dose of heparin last dose given on 9.1.22 @ 21:04. INR pending.   IR consulted for possible native renal biopsy. Case has been reviewed and procedure approved by Dr. Laurence Ferrari.  Patient tentatively scheduled for 9.6.22.  Team instructed to: Keep Patient to be NPO after midnight Hold prophylactic anticoagulation the evening of 9.5.22 if restarted ( 1 dose prior to procedure).  Address patient's Hgb and troponin  Should patient be well be enough to be discharged procedure can be performed as outpatient.   IR will call patient when ready.   Risks and benefits of native kidney biopsy was discussed with the patient and/or patient's family including, but not limited to bleeding, infection, damage to adjacent structures or low yield requiring additional tests.  All of the questions were answered and there is agreement to proceed.  Consent signed and in chart   Thank you for this interesting consult.  I greatly enjoyed meeting Caleb Simmons and look forward to participating in their care.  A copy of this report was sent to the requesting provider on this date.  Electronically Signed: Jacqualine Mau, NP 08/24/2021, 9:55 AM   I spent a total of 40 Minutes    in face to face in clinical consultation, greater than 50% of which was counseling/coordinating care for native kidney biopsy

## 2021-08-25 DIAGNOSIS — N179 Acute kidney failure, unspecified: Secondary | ICD-10-CM | POA: Diagnosis not present

## 2021-08-25 DIAGNOSIS — R778 Other specified abnormalities of plasma proteins: Secondary | ICD-10-CM | POA: Diagnosis not present

## 2021-08-25 DIAGNOSIS — D649 Anemia, unspecified: Secondary | ICD-10-CM | POA: Diagnosis not present

## 2021-08-25 LAB — RENAL FUNCTION PANEL
Albumin: 1.9 g/dL — ABNORMAL LOW (ref 3.5–5.0)
Anion gap: 8 (ref 5–15)
BUN: 39 mg/dL — ABNORMAL HIGH (ref 8–23)
CO2: 20 mmol/L — ABNORMAL LOW (ref 22–32)
Calcium: 8.4 mg/dL — ABNORMAL LOW (ref 8.9–10.3)
Chloride: 113 mmol/L — ABNORMAL HIGH (ref 98–111)
Creatinine, Ser: 5.71 mg/dL — ABNORMAL HIGH (ref 0.61–1.24)
GFR, Estimated: 10 mL/min — ABNORMAL LOW (ref >60)
Glucose, Bld: 84 mg/dL (ref 70–99)
Phosphorus: 5.6 mg/dL — ABNORMAL HIGH (ref 2.5–4.6)
Potassium: 4.2 mmol/L (ref 3.5–5.1)
Sodium: 141 mmol/L (ref 135–145)

## 2021-08-25 LAB — CBC
HCT: 24.9 % — ABNORMAL LOW (ref 39.0–52.0)
Hemoglobin: 7.8 g/dL — ABNORMAL LOW (ref 13.0–17.0)
MCH: 25.5 pg — ABNORMAL LOW (ref 26.0–34.0)
MCHC: 31.3 g/dL (ref 30.0–36.0)
MCV: 81.4 fL (ref 80.0–100.0)
Platelets: 282 10*3/uL (ref 150–400)
RBC: 3.06 MIL/uL — ABNORMAL LOW (ref 4.22–5.81)
RDW: 18.6 % — ABNORMAL HIGH (ref 11.5–15.5)
WBC: 9.5 10*3/uL (ref 4.0–10.5)
nRBC: 0 % (ref 0.0–0.2)

## 2021-08-25 LAB — MAGNESIUM: Magnesium: 2.1 mg/dL (ref 1.7–2.4)

## 2021-08-25 MED ORDER — TAMSULOSIN HCL 0.4 MG PO CAPS
0.4000 mg | ORAL_CAPSULE | Freq: Every day | ORAL | Status: DC
  Administered 2021-08-25 – 2021-09-24 (×31): 0.4 mg via ORAL
  Filled 2021-08-25 (×31): qty 1

## 2021-08-25 NOTE — Progress Notes (Signed)
[facility] Progress Note    Assessment/ Plan:    AKI: no history of renal function issues, does have a history of excessive NSAID use (BC powders daily).  AKI with Cr 6.95.  Known history of nephrolithiasis- CT scan without obstruction/stones. UA with > 300 mg protein and 11-20 RBCs/ hpf.  ANA, ANCA, C3/4, SPEP, anti-GBM, ds DNA, Hep B/C serologies, HIV- all negative/WNL Given hematuria without stones/obstruction, worsening anemia, AKI, sinus issues-there is a concern for a vasculitis picture, will proceed with native kidney biopsy which which is planned for Tues 9/6 No indication for renal replacement therapy at this time Daily labs, strict I/O Recommend d/c'ing foley and trying for TOV while he's here Will d/c fluid restriction Anemia: FOBT negative, getting pRBCs. Likely the cause of his near syncope- EKG OK and trops flat. Iron deficient on iron panel. Ferrlecit ordered x 4 doses Elevated d-dimer: vq scan low probability of PE Nephrolithiasis: sees Dr Johney Frame obstruction on ct renal stone protocol Metabolic acidosis: on nahco3, increasing to tid Dispo: admitted  Gean Quint, MD [facility]   Subjective:   Anxiety slightly better. Son at bedside. No acute events. Only complaint is that he endorses a lot of thirst due to his fluid restriction order. Bx planned for this coming Tuesday   Objective:   BP 139/72 (BP Location: Right Arm)   Pulse 74   Temp 97.6 F (36.4 C) (Oral)   Resp 19   Ht 5\' 10"  (1.778 m)   Wt 81.6 kg   SpO2 96%   BMI 25.83 kg/m   Intake/Output Summary (Last 24 hours) at 08/25/2021 1126 Last data filed at 08/25/2021 1110 Gross per 24 hour  Intake 1113.97 ml  Output 2600 ml  Net -1486.03 ml   Weight change:   Physical Exam: Gen:nad CVS:rrr Resp:cta bl ZOX:WRUE, nt/nd Ext:no edema Skin: no rash Neuro: awake, alert GU: foley  Imaging: NM Pulmonary Perfusion  Result Date: 08/23/2021 CLINICAL DATA:  PE  suspected, shortness of breath EXAM: NUCLEAR MEDICINE PERFUSION LUNG SCAN TECHNIQUE: Perfusion images were obtained in multiple projections after intravenous injection of radiopharmaceutical. Ventilation scans intentionally deferred if perfusion scan and chest x-ray adequate for interpretation during COVID 19 epidemic. RADIOPHARMACEUTICALS:  4.2 mCi Tc-66m MAA IV COMPARISON:  Same-day chest radiographs FINDINGS: Normal, homogeneous pulmonary perfusion. No suspicious filling defects. IMPRESSION: Very low probability examination for pulmonary embolism by modified perfusion only PIOPED criteria (PE absent). Electronically Signed   By: Eddie Candle M.D.   On: 08/23/2021 15:59   ECHOCARDIOGRAM COMPLETE  Result Date: 08/24/2021    ECHOCARDIOGRAM REPORT   Patient Name:   Caleb Simmons Date of Exam: 08/24/2021 Medical Rec #:  [id number]    Height:       70.0 in Accession #:    [id number]   Weight:       180.0 lb Date of Birth:  1951-11-28     BSA:          1.996 m Patient Age:    70 years     BP:           120/69 mmHg Patient Gender: M            HR:           85 bpm. Exam Location:  Inpatient Procedure: 2D Echo, Cardiac Doppler and Color Doppler Indications:    Abnormal EKG  History:        Patient has no prior history of Echocardiogram examinations.  COPD, Signs/Symptoms:Dyspnea and Weakness, renal disease,                 anemia, elevated troponin; Risk Factors:Current Smoker.  Sonographer:    Dustin Flock RDCS Referring Phys: [place]  Sonographer Comments: Image acquisition challenging due to COPD. IMPRESSIONS  1. Left ventricular ejection fraction, by estimation, is 55 to 60%. The left ventricle has normal function. The left ventricle has no regional wall motion abnormalities. There is mild concentric left ventricular hypertrophy. Left ventricular diastolic parameters are consistent with Grade I diastolic dysfunction (impaired relaxation).  2. Right ventricular systolic function is  normal. The right ventricular size is normal. Tricuspid regurgitation signal is inadequate for assessing PA pressure.  3. The mitral valve is grossly normal. No evidence of mitral valve regurgitation. No evidence of mitral stenosis.  4. The aortic valve was not well visualized. Aortic valve regurgitation is not visualized. No aortic stenosis is present.  5. The inferior vena cava is normal in size with greater than 50% respiratory variability, suggesting right atrial pressure of 3 mmHg. Comparison(s): No prior Echocardiogram. FINDINGS  Left Ventricle: Left ventricular ejection fraction, by estimation, is 55 to 60%. The left ventricle has normal function. The left ventricle has no regional wall motion abnormalities. The left ventricular internal cavity size was normal in size. There is  mild concentric left ventricular hypertrophy. Left ventricular diastolic parameters are consistent with Grade I diastolic dysfunction (impaired relaxation). Right Ventricle: The right ventricular size is normal. No increase in right ventricular wall thickness. Right ventricular systolic function is normal. Tricuspid regurgitation signal is inadequate for assessing PA pressure. Left Atrium: Left atrial size was normal in size. Right Atrium: Right atrial size was normal in size. Pericardium: There is no evidence of pericardial effusion. Mitral Valve: The mitral valve is grossly normal. No evidence of mitral valve regurgitation. No evidence of mitral valve stenosis. Tricuspid Valve: The tricuspid valve is normal in structure. Tricuspid valve regurgitation is not demonstrated. No evidence of tricuspid stenosis. Aortic Valve: The aortic valve was not well visualized. Aortic valve regurgitation is not visualized. No aortic stenosis is present. Pulmonic Valve: The pulmonic valve was not well visualized. Pulmonic valve regurgitation is not visualized. Aorta: The aortic root is normal in size and structure. Venous: The inferior vena cava is  normal in size with greater than 50% respiratory variability, suggesting right atrial pressure of 3 mmHg. IAS/Shunts: The atrial septum is grossly normal.  LEFT VENTRICLE PLAX 2D LVIDd:         5.80 cm      Diastology LVIDs:         3.60 cm      LV e' medial:    5.55 cm/s LV PW:         1.30 cm      LV E/e' medial:  14.5 LV IVS:        1.30 cm      LV e' lateral:   6.74 cm/s LVOT diam:     2.40 cm      LV E/e' lateral: 12.0 LV SV:         76 LV SV Index:   38 LVOT Area:     4.52 cm  LV Volumes (MOD) LV vol d, MOD A4C: 124.0 ml LV vol s, MOD A4C: 47.8 ml LV SV MOD A4C:     124.0 ml RIGHT VENTRICLE RV Basal diam:  2.80 cm RV S prime:     10.10 cm/s TAPSE (M-mode):  2.9 cm LEFT ATRIUM             Index       RIGHT ATRIUM           Index LA diam:        3.80 cm 1.90 cm/m  RA Area:     13.10 cm LA Vol (A2C):   31.8 ml 15.93 ml/m RA Volume:   29.70 ml  14.88 ml/m LA Vol (A4C):   33.0 ml 16.53 ml/m LA Biplane Vol: 34.9 ml 17.49 ml/m  AORTIC VALVE LVOT Vmax:   98.30 cm/s LVOT Vmean:  60.300 cm/s LVOT VTI:    0.169 m  AORTA Ao Root diam: 3.30 cm MITRAL VALVE MV Area (PHT): 3.85 cm    SHUNTS MV Decel Time: 197 msec    Systemic VTI:  0.17 m MV E velocity: 80.60 cm/s  Systemic Diam: 2.40 cm MV A velocity: 49.30 cm/s MV E/A ratio:  1.63 Rudean Haskell MD Electronically signed by Rudean Haskell MD Signature Date/Time: 08/24/2021/11:38:40 AM    Final    CT Renal Stone Study  Result Date: 08/23/2021 CLINICAL DATA:  Hematuria, new renal failure EXAM: CT ABDOMEN AND PELVIS WITHOUT CONTRAST TECHNIQUE: Multidetector CT imaging of the abdomen and pelvis was performed following the standard protocol without IV contrast. COMPARISON:  06/26/2007 FINDINGS: Lower chest: There is mild, scattered, nonspecific ground-glass and fine nodularity throughout the included bilateral lung bases (series 5, 4). Hepatobiliary: No solid liver abnormality is seen. No gallstones, gallbladder wall thickening, or biliary dilatation.  Pancreas: Unremarkable. No pancreatic ductal dilatation or surrounding inflammatory changes. Spleen: Normal in size without significant abnormality. Adrenals/Urinary Tract: Adrenal glands are unremarkable. Kidneys are normal, without renal calculi, solid lesion, or hydronephrosis. Distended urinary bladder, measuring at least 17.4 cm. Stomach/Bowel: Stomach is within normal limits. Appendix not clearly visualized and may be surgically. No evidence of bowel wall thickening, distention, or inflammatory changes. Descending and sigmoid diverticulosis. Vascular/Lymphatic: Aortic atherosclerosis. No enlarged abdominal or pelvic lymph nodes. Reproductive: No mass or other significant abnormality. Other: Small, fat containing bilateral inguinal hernias no abdominopelvic ascites. Musculoskeletal: No acute or significant osseous findings. IMPRESSION: 1. No evidence of urinary tract calculus or hydronephrosis. 2. Distended urinary bladder, measuring at least 17.4 cm. Correlate for urinary retention. 3. Descending and sigmoid diverticulosis without evidence of acute diverticulitis. 4. Mild, nonspecific scattered ground-glass and fine nodularity throughout the bilateral lung bases, possibly infectious or inflammatory. Aortic Atherosclerosis (ICD10-I70.0). Electronically Signed   By: Eddie Candle M.D.   On: 08/23/2021 16:04    Labs: BMET Recent Labs  Lab 08/23/21 0820 08/24/21 0055 08/25/21 0758  NA 138 137 141  K 4.4 4.5 4.2  CL 111 110 113*  CO2 16* 16* 20*  GLUCOSE 97 93 84  BUN 44* 41* 39*  CREATININE 6.56* 6.12* 5.71*  CALCIUM 8.0* 7.7* 8.4*  PHOS  --  5.6* 5.6*   CBC Recent Labs  Lab 08/23/21 0820 08/23/21 2018 08/24/21 0055 08/24/21 1128 08/25/21 0758  WBC 14.2*  --  17.2*  --  9.5  HGB 6.0* 6.5* 6.0* 7.1* 7.8*  HCT 20.5* 21.7* 18.6* 22.9* 24.9*  MCV 79.5*  --  78.5*  --  81.4  PLT 421*  --  310  --  282    Medications:     Chlorhexidine Gluconate Cloth  6 each Topical Daily   melatonin   5 mg Oral QHS   nicotine  14 mg Transdermal Q24H   pantoprazole  40 mg Oral  BID AC   sodium bicarbonate  1,300 mg Oral TID   tamsulosin  0.4 mg Oral Daily      Gean Quint, MD [facility] 08/25/2021, 11:26 AM

## 2021-08-25 NOTE — Progress Notes (Signed)
PROGRESS NOTE        PATIENT DETAILS Name: Caleb Simmons Age: 70 y.o. Sex: male Date of Birth: [birth year] Admit Date: 08/23/2021 Admitting Physician Costin Karlyne Greenspan, MD PCP:Pcp, No  Brief Narrative: Patient is a 70 y.o. male with history of nephrolithiasis-presenting with generalized weakness/shortness of breath-found to have AKI and severe normocytic anemia.  Admitted to the hospitalist service for further evaluation and treatment.  Significant events: 9/1>> admit for evaluation of AKI/severe anemia.  Significant studies: 9/1>> CXR: No pneumonia 9/1>> VQ scan: No PE 9/1>> CT renal stone study: No hydronephrosis, distended bladder.  Diverticulosis. 9/1>> FOBT: Negative 9/1>> UA: Protein>> 300, RBCs 11-20/hpf 9/1>> HIV: Nonreactive 9/1>> HBsAg/HCV Ab: Nonreactive 9/1>> C3/C4: Normal limits 9/1>> glomerular basement Ab: Negative 9/1>> ANA: Negative 9/1>> dsDNA: Negative 9/1>> ANCA titers: Negative 9/1>> SPEP: Pending 9/2>> Echo: EF 28-00%, grade 1 diastolic dysfunction.   Antimicrobial therapy: None  Microbiology data: 9/1>> COVID PCR: Negative  Procedures : None  Consults: Nephrology, IR  DVT Prophylaxis : Place and maintain sequential compression device Start: 08/24/21 0519   Subjective: No major issues overnight-anxiety is improved.  Assessment/Plan: Acute kidney injury: Improving-suspicion for glomerular pathology given proteinuria/RBCs in urine.  Extensive serological work-up negative so far.  Remove Foley catheter today-see how he does with voiding trial.  Renal biopsy planned for 9/6-discussed with nephrologist Dr. Linford Arnold renal function continues to improve-could possibly perform renal biopsy in the outpatient setting.  Continue to monitor closely-follow labs/urine output in AM.     Intake/Output Summary (Last 24 hours) at 08/25/2021 1411 Last data filed at 08/25/2021 1350 Gross per 24 hour  Intake 1113.97 ml  Output 2700 ml  Net  -1586.03 ml    Microcytic anemia: Appears multifactorial-iron deficiency and anemia due to kidney failure.  Hemoglobin is stabilized after 2 units of PRBC infusion-IV iron and Aranesp.  FOBT negative.  .  Minimally elevated troponins: Trend is flat-not consistent with ACS-he does not have any chest pain.  Exertional dyspnea was from symptomatic anemia.  Suspect elevated troponin is in the setting of kidney failure.  Echo without any wall motion malady and preserved EF.  Doubt any further work-up is required-especially in light of severity of anemia and AKI.  Anxiety: Reassurance provided-no response to Atarax-have started as needed Xanax.  Tobacco abuse: Start transdermal nicotine-counseled prior to discharge.  Diet: Diet Order             Diet renal with fluid restriction Room service appropriate? Yes; Fluid consistency: Thin  Diet effective now                    Code Status: Full code or DNR  Family Communication: Daughter-in-law at bedside at bedside on 9/2-none at bedside on 9/3.  Disposition Plan: Status is: Inpatient  Remains inpatient appropriate because:Inpatient level of care appropriate due to severity of illness  Dispo: The patient is from: Home              Anticipated d/c is to: Home              Patient currently is not medically stable to d/c.   Difficult to place patient No     Barriers to Discharge: Severe AKI-severe anemia requiring PRBC transfusion-extensive serological work-up pending-renal biopsy ordered.  Clearly not stable for discharge at this point.  Antimicrobial agents: Anti-infectives (From  admission, onward)    None        Time spent: 35 minutes-Greater than 50% of this time was spent in counseling, explanation of diagnosis, planning of further management, and coordination of care.  MEDICATIONS: Scheduled Meds:  melatonin  5 mg Oral QHS   nicotine  14 mg Transdermal Q24H   pantoprazole  40 mg Oral BID AC   sodium bicarbonate   1,300 mg Oral TID   tamsulosin  0.4 mg Oral Daily   Continuous Infusions:  sodium chloride     sodium chloride 75 mL/hr at 08/24/21 1936   ferric gluconate (FERRLECIT) IVPB 250 mg (08/25/21 0935)   PRN Meds:.ALPRAZolam, bisacodyl, ondansetron **OR** ondansetron (ZOFRAN) IV, promethazine, technetium albumin aggregated   PHYSICAL EXAM: Vital signs: Vitals:   08/24/21 2356 08/25/21 0340 08/25/21 0823 08/25/21 1118  BP: 116/70 (!) 106/53 140/77 139/72  Pulse: 74  73 74  Resp: 18  14 19   Temp: 98.3 F (36.8 C) 98 F (36.7 C) 98.2 F (36.8 C) 97.6 F (36.4 C)  TempSrc: Oral Oral Oral Oral  SpO2: 95%  98% 96%  Weight:      Height:       Filed Weights   08/23/21 0816  Weight: 81.6 kg   Body mass index is 25.83 kg/m.   Gen Exam:Alert awake-not in any distress HEENT:atraumatic, normocephalic Chest: B/L clear to auscultation anteriorly CVS:S1S2 regular Abdomen:soft non tender, non distended Extremities:no edema Neurology: Non focal Skin: no rash   I have personally reviewed following labs and imaging studies  LABORATORY DATA: CBC: Recent Labs  Lab 08/23/21 0820 08/23/21 2018 08/24/21 0055 08/24/21 1128 08/25/21 0758  WBC 14.2*  --  17.2*  --  9.5  HGB 6.0* 6.5* 6.0* 7.1* 7.8*  HCT 20.5* 21.7* 18.6* 22.9* 24.9*  MCV 79.5*  --  78.5*  --  81.4  PLT 421*  --  310  --  282     Basic Metabolic Panel: Recent Labs  Lab 08/23/21 0820 08/23/21 1037 08/24/21 0055 08/25/21 0037 08/25/21 0758  NA 138  --  137  --  141  K 4.4  --  4.5  --  4.2  CL 111  --  110  --  113*  CO2 16*  --  16*  --  20*  GLUCOSE 97  --  93  --  84  BUN 44*  --  41*  --  39*  CREATININE 6.56*  --  6.12*  --  5.71*  CALCIUM 8.0*  --  7.7*  --  8.4*  MG  --  1.6* 1.5* 2.1  --   PHOS  --   --  5.6*  --  5.6*     GFR: Estimated Creatinine Clearance: 12.3 mL/min (A) (by C-G formula based on SCr of 5.71 mg/dL (H)).  Liver Function Tests: Recent Labs  Lab 08/23/21 1037  08/24/21 0055 08/25/21 0758  AST 17 14*  --   ALT 10 10  --   ALKPHOS 66 57  --   BILITOT 0.3 0.4  --   PROT 5.6* 4.8*  --   ALBUMIN 2.2* 1.9* 1.9*    Recent Labs  Lab 08/23/21 1037  LIPASE 70*    No results for input(s): AMMONIA in the last 168 hours.  Coagulation Profile: Recent Labs  Lab 08/24/21 1128  INR 1.6*    Cardiac Enzymes: No results for input(s): CKTOTAL, CKMB, CKMBINDEX, TROPONINI in the last 168 hours.  BNP (last 3 results) No  results for input(s): PROBNP in the last 8760 hours.  Lipid Profile: No results for input(s): CHOL, HDL, LDLCALC, TRIG, CHOLHDL, LDLDIRECT in the last 72 hours.  Thyroid Function Tests: No results for input(s): TSH, T4TOTAL, FREET4, T3FREE, THYROIDAB in the last 72 hours.  Anemia Panel: Recent Labs    08/23/21 1703  FERRITIN 20*  TIBC 350  IRON 12*     Urine analysis:    Component Value Date/Time   COLORURINE STRAW (A) 08/23/2021 1140   APPEARANCEUR CLEAR 08/23/2021 1140   LABSPEC 1.008 08/23/2021 1140   PHURINE 6.0 08/23/2021 1140   GLUCOSEU 50 (A) 08/23/2021 1140   HGBUR SMALL (A) 08/23/2021 1140   BILIRUBINUR NEGATIVE 08/23/2021 1140   KETONESUR NEGATIVE 08/23/2021 1140   PROTEINUR >=300 (A) 08/23/2021 1140   UROBILINOGEN 0.2 06/26/2007 1023   NITRITE NEGATIVE 08/23/2021 1140   LEUKOCYTESUR NEGATIVE 08/23/2021 1140    Sepsis Labs: Lactic Acid, Venous No results found for: LATICACIDVEN  MICROBIOLOGY: Recent Results (from the past 240 hour(s))  SARS CORONAVIRUS 2 (TAT 6-24 HRS) Nasopharyngeal Nasopharyngeal Swab     Status: None   Collection Time: 08/23/21  5:11 PM   Specimen: Nasopharyngeal Swab  Result Value Ref Range Status   SARS Coronavirus 2 NEGATIVE NEGATIVE Final    Comment: (NOTE) SARS-CoV-2 target nucleic acids are NOT DETECTED.  The SARS-CoV-2 RNA is generally detectable in upper and lower respiratory specimens during the acute phase of infection. Negative results do not preclude  SARS-CoV-2 infection, do not rule out co-infections with other pathogens, and should not be used as the sole basis for treatment or other patient management decisions. Negative results must be combined with clinical observations, patient history, and epidemiological information. The expected result is Negative.  Fact Sheet for Patients: SugarRoll.be  Fact Sheet for Healthcare Providers: https://www.woods-mathews.com/  This test is not yet approved or cleared by the [place] FDA and  has been authorized for detection and/or diagnosis of SARS-CoV-2 by FDA under an Emergency Use Authorization (EUA). This EUA will remain  in effect (meaning this test can be used) for the duration of the COVID-19 declaration under Se ction 564(b)(1) of the Act, 21 U.S.C. section 360bbb-3(b)(1), unless the authorization is terminated or revoked sooner.  Performed at [facility], [place] [address], [place], [place] [address]     RADIOLOGY STUDIES/RESULTS: NM Pulmonary Perfusion  Result Date: 08/23/2021 CLINICAL DATA:  PE suspected, shortness of breath EXAM: NUCLEAR MEDICINE PERFUSION LUNG SCAN TECHNIQUE: Perfusion images were obtained in multiple projections after intravenous injection of radiopharmaceutical. Ventilation scans intentionally deferred if perfusion scan and chest x-ray adequate for interpretation during COVID 19 epidemic. RADIOPHARMACEUTICALS:  4.2 mCi Tc-64m MAA IV COMPARISON:  Same-day chest radiographs FINDINGS: Normal, homogeneous pulmonary perfusion. No suspicious filling defects. IMPRESSION: Very low probability examination for pulmonary embolism by modified perfusion only PIOPED criteria (PE absent). Electronically Signed   By: Eddie Candle M.D.   On: 08/23/2021 15:59   ECHOCARDIOGRAM COMPLETE  Result Date: 08/24/2021    ECHOCARDIOGRAM REPORT   Patient Name:   Caleb Simmons Date of Exam: 08/24/2021 Medical Rec #:  [id number]    Height:        70.0 in Accession #:    [id number]   Weight:       180.0 lb Date of Birth:  06/15/1951     BSA:          1.996 m Patient Age:    10 years     BP:  120/69 mmHg Patient Gender: M            HR:           85 bpm. Exam Location:  Inpatient Procedure: 2D Echo, Cardiac Doppler and Color Doppler Indications:    Abnormal EKG  History:        Patient has no prior history of Echocardiogram examinations.                 COPD, Signs/Symptoms:Dyspnea and Weakness, renal disease,                 anemia, elevated troponin; Risk Factors:Current Smoker.  Sonographer:    Dustin Flock RDCS Referring Phys: [place]  Sonographer Comments: Image acquisition challenging due to COPD. IMPRESSIONS  1. Left ventricular ejection fraction, by estimation, is 55 to 60%. The left ventricle has normal function. The left ventricle has no regional wall motion abnormalities. There is mild concentric left ventricular hypertrophy. Left ventricular diastolic parameters are consistent with Grade I diastolic dysfunction (impaired relaxation).  2. Right ventricular systolic function is normal. The right ventricular size is normal. Tricuspid regurgitation signal is inadequate for assessing PA pressure.  3. The mitral valve is grossly normal. No evidence of mitral valve regurgitation. No evidence of mitral stenosis.  4. The aortic valve was not well visualized. Aortic valve regurgitation is not visualized. No aortic stenosis is present.  5. The inferior vena cava is normal in size with greater than 50% respiratory variability, suggesting right atrial pressure of 3 mmHg. Comparison(s): No prior Echocardiogram. FINDINGS  Left Ventricle: Left ventricular ejection fraction, by estimation, is 55 to 60%. The left ventricle has normal function. The left ventricle has no regional wall motion abnormalities. The left ventricular internal cavity size was normal in size. There is  mild concentric left ventricular hypertrophy. Left ventricular  diastolic parameters are consistent with Grade I diastolic dysfunction (impaired relaxation). Right Ventricle: The right ventricular size is normal. No increase in right ventricular wall thickness. Right ventricular systolic function is normal. Tricuspid regurgitation signal is inadequate for assessing PA pressure. Left Atrium: Left atrial size was normal in size. Right Atrium: Right atrial size was normal in size. Pericardium: There is no evidence of pericardial effusion. Mitral Valve: The mitral valve is grossly normal. No evidence of mitral valve regurgitation. No evidence of mitral valve stenosis. Tricuspid Valve: The tricuspid valve is normal in structure. Tricuspid valve regurgitation is not demonstrated. No evidence of tricuspid stenosis. Aortic Valve: The aortic valve was not well visualized. Aortic valve regurgitation is not visualized. No aortic stenosis is present. Pulmonic Valve: The pulmonic valve was not well visualized. Pulmonic valve regurgitation is not visualized. Aorta: The aortic root is normal in size and structure. Venous: The inferior vena cava is normal in size with greater than 50% respiratory variability, suggesting right atrial pressure of 3 mmHg. IAS/Shunts: The atrial septum is grossly normal.  LEFT VENTRICLE PLAX 2D LVIDd:         5.80 cm      Diastology LVIDs:         3.60 cm      LV e' medial:    5.55 cm/s LV PW:         1.30 cm      LV E/e' medial:  14.5 LV IVS:        1.30 cm      LV e' lateral:   6.74 cm/s LVOT diam:     2.40 cm  LV E/e' lateral: 12.0 LV SV:         76 LV SV Index:   38 LVOT Area:     4.52 cm  LV Volumes (MOD) LV vol d, MOD A4C: 124.0 ml LV vol s, MOD A4C: 47.8 ml LV SV MOD A4C:     124.0 ml RIGHT VENTRICLE RV Basal diam:  2.80 cm RV S prime:     10.10 cm/s TAPSE (M-mode): 2.9 cm LEFT ATRIUM             Index       RIGHT ATRIUM           Index LA diam:        3.80 cm 1.90 cm/m  RA Area:     13.10 cm LA Vol (A2C):   31.8 ml 15.93 ml/m RA Volume:   29.70 ml   14.88 ml/m LA Vol (A4C):   33.0 ml 16.53 ml/m LA Biplane Vol: 34.9 ml 17.49 ml/m  AORTIC VALVE LVOT Vmax:   98.30 cm/s LVOT Vmean:  60.300 cm/s LVOT VTI:    0.169 m  AORTA Ao Root diam: 3.30 cm MITRAL VALVE MV Area (PHT): 3.85 cm    SHUNTS MV Decel Time: 197 msec    Systemic VTI:  0.17 m MV E velocity: 80.60 cm/s  Systemic Diam: 2.40 cm MV A velocity: 49.30 cm/s MV E/A ratio:  1.63 Rudean Haskell MD Electronically signed by Rudean Haskell MD Signature Date/Time: 08/24/2021/11:38:40 AM    Final    CT Renal Stone Study  Result Date: 08/23/2021 CLINICAL DATA:  Hematuria, new renal failure EXAM: CT ABDOMEN AND PELVIS WITHOUT CONTRAST TECHNIQUE: Multidetector CT imaging of the abdomen and pelvis was performed following the standard protocol without IV contrast. COMPARISON:  06/26/2007 FINDINGS: Lower chest: There is mild, scattered, nonspecific ground-glass and fine nodularity throughout the included bilateral lung bases (series 5, 4). Hepatobiliary: No solid liver abnormality is seen. No gallstones, gallbladder wall thickening, or biliary dilatation. Pancreas: Unremarkable. No pancreatic ductal dilatation or surrounding inflammatory changes. Spleen: Normal in size without significant abnormality. Adrenals/Urinary Tract: Adrenal glands are unremarkable. Kidneys are normal, without renal calculi, solid lesion, or hydronephrosis. Distended urinary bladder, measuring at least 17.4 cm. Stomach/Bowel: Stomach is within normal limits. Appendix not clearly visualized and may be surgically. No evidence of bowel wall thickening, distention, or inflammatory changes. Descending and sigmoid diverticulosis. Vascular/Lymphatic: Aortic atherosclerosis. No enlarged abdominal or pelvic lymph nodes. Reproductive: No mass or other significant abnormality. Other: Small, fat containing bilateral inguinal hernias no abdominopelvic ascites. Musculoskeletal: No acute or significant osseous findings. IMPRESSION: 1. No evidence  of urinary tract calculus or hydronephrosis. 2. Distended urinary bladder, measuring at least 17.4 cm. Correlate for urinary retention. 3. Descending and sigmoid diverticulosis without evidence of acute diverticulitis. 4. Mild, nonspecific scattered ground-glass and fine nodularity throughout the bilateral lung bases, possibly infectious or inflammatory. Aortic Atherosclerosis (ICD10-I70.0). Electronically Signed   By: Eddie Candle M.D.   On: 08/23/2021 16:04     LOS: 2 days   Oren Binet, MD  [facility]    To contact the attending provider between 7A-7P or the covering provider during after hours 7P-7A, please log into the web site [email or web address] and access using universal [place] password for that web site. If you do not have the password, please call the hospital operator.  08/25/2021, 2:07 PM

## 2021-08-25 NOTE — Plan of Care (Signed)
  Problem: Fluid Volume: Goal: Compliance with measures to maintain balanced fluid volume will improve Outcome: Progressing   Problem: Clinical Measurements: Goal: Complications related to the disease process, condition or treatment will be avoided or minimized Outcome: Progressing   Problem: Clinical Measurements: Goal: Will remain free from infection Outcome: Progressing Goal: Diagnostic test results will improve Outcome: Progressing   Problem: Activity: Goal: Risk for activity intolerance will decrease Outcome: Progressing   Problem: Elimination: Goal: Will not experience complications related to bowel motility Outcome: Progressing   Problem: Skin Integrity: Goal: Risk for impaired skin integrity will decrease Outcome: Progressing

## 2021-08-26 DIAGNOSIS — N179 Acute kidney failure, unspecified: Secondary | ICD-10-CM | POA: Diagnosis not present

## 2021-08-26 DIAGNOSIS — D649 Anemia, unspecified: Secondary | ICD-10-CM | POA: Diagnosis not present

## 2021-08-26 DIAGNOSIS — R778 Other specified abnormalities of plasma proteins: Secondary | ICD-10-CM | POA: Diagnosis not present

## 2021-08-26 LAB — CBC
HCT: 21.3 % — ABNORMAL LOW (ref 39.0–52.0)
HCT: 27.4 % — ABNORMAL LOW (ref 39.0–52.0)
Hemoglobin: 6.8 g/dL — CL (ref 13.0–17.0)
Hemoglobin: 8.8 g/dL — ABNORMAL LOW (ref 13.0–17.0)
MCH: 26 pg (ref 26.0–34.0)
MCH: 26.2 pg (ref 26.0–34.0)
MCHC: 31.9 g/dL (ref 30.0–36.0)
MCHC: 32.1 g/dL (ref 30.0–36.0)
MCV: 81.3 fL (ref 80.0–100.0)
MCV: 81.5 fL (ref 80.0–100.0)
Platelets: 253 10*3/uL (ref 150–400)
Platelets: 261 10*3/uL (ref 150–400)
RBC: 2.62 MIL/uL — ABNORMAL LOW (ref 4.22–5.81)
RBC: 3.36 MIL/uL — ABNORMAL LOW (ref 4.22–5.81)
RDW: 17.8 % — ABNORMAL HIGH (ref 11.5–15.5)
RDW: 18.7 % — ABNORMAL HIGH (ref 11.5–15.5)
WBC: 10.7 10*3/uL — ABNORMAL HIGH (ref 4.0–10.5)
WBC: 12.1 10*3/uL — ABNORMAL HIGH (ref 4.0–10.5)
nRBC: 0 % (ref 0.0–0.2)
nRBC: 0 % (ref 0.0–0.2)

## 2021-08-26 LAB — RENAL FUNCTION PANEL
Albumin: 1.7 g/dL — ABNORMAL LOW (ref 3.5–5.0)
Anion gap: 7 (ref 5–15)
BUN: 40 mg/dL — ABNORMAL HIGH (ref 8–23)
CO2: 19 mmol/L — ABNORMAL LOW (ref 22–32)
Calcium: 7.8 mg/dL — ABNORMAL LOW (ref 8.9–10.3)
Chloride: 111 mmol/L (ref 98–111)
Creatinine, Ser: 5.52 mg/dL — ABNORMAL HIGH (ref 0.61–1.24)
GFR, Estimated: 10 mL/min — ABNORMAL LOW (ref >60)
Glucose, Bld: 106 mg/dL — ABNORMAL HIGH (ref 70–99)
Phosphorus: 4.3 mg/dL (ref 2.5–4.6)
Potassium: 4 mmol/L (ref 3.5–5.1)
Sodium: 137 mmol/L (ref 135–145)

## 2021-08-26 LAB — HEPATIC FUNCTION PANEL
ALT: 9 U/L (ref 0–44)
AST: 14 U/L — ABNORMAL LOW (ref 15–41)
Albumin: 1.9 g/dL — ABNORMAL LOW (ref 3.5–5.0)
Alkaline Phosphatase: 56 U/L (ref 38–126)
Bilirubin, Direct: <0.1 mg/dL (ref 0.0–0.2)
Total Bilirubin: 0.8 mg/dL (ref 0.3–1.2)
Total Protein: 5 g/dL — ABNORMAL LOW (ref 6.5–8.1)

## 2021-08-26 LAB — PREPARE RBC (CROSSMATCH)

## 2021-08-26 LAB — LACTATE DEHYDROGENASE: LDH: 160 U/L (ref 98–192)

## 2021-08-26 LAB — RETICULOCYTES
Immature Retic Fract: 34.6 % — ABNORMAL HIGH (ref 2.3–15.9)
RBC.: 3.31 MIL/uL — ABNORMAL LOW (ref 4.22–5.81)
Retic Count, Absolute: 48.7 10*3/uL (ref 19.0–186.0)
Retic Ct Pct: 1.5 % (ref 0.4–3.1)

## 2021-08-26 LAB — FOLATE: Folate: 5.5 ng/mL — ABNORMAL LOW (ref >5.9)

## 2021-08-26 LAB — VITAMIN B12: Vitamin B-12: 298 pg/mL (ref 180–914)

## 2021-08-26 MED ORDER — TRAZODONE HCL 50 MG PO TABS
50.0000 mg | ORAL_TABLET | Freq: Every evening | ORAL | Status: DC | PRN
  Administered 2021-08-26 – 2021-09-19 (×14): 50 mg via ORAL
  Filled 2021-08-26 (×14): qty 1

## 2021-08-26 MED ORDER — NICOTINE 14 MG/24HR TD PT24
14.0000 mg | MEDICATED_PATCH | Freq: Once | TRANSDERMAL | Status: AC
  Administered 2021-08-26: 14 mg via TRANSDERMAL

## 2021-08-26 MED ORDER — FOLIC ACID 1 MG PO TABS
2.0000 mg | ORAL_TABLET | Freq: Every day | ORAL | Status: DC
  Administered 2021-08-26 – 2021-09-24 (×30): 2 mg via ORAL
  Filled 2021-08-26 (×30): qty 2

## 2021-08-26 MED ORDER — NICOTINE 14 MG/24HR TD PT24
14.0000 mg | MEDICATED_PATCH | TRANSDERMAL | Status: DC
  Administered 2021-08-27 – 2021-09-24 (×29): 14 mg via TRANSDERMAL
  Filled 2021-08-26 (×29): qty 1

## 2021-08-26 MED ORDER — CYANOCOBALAMIN 1000 MCG/ML IJ SOLN
1000.0000 ug | Freq: Every day | INTRAMUSCULAR | Status: AC
  Administered 2021-08-26 – 2021-09-01 (×7): 1000 ug via SUBCUTANEOUS
  Filled 2021-08-26 (×8): qty 1

## 2021-08-26 MED ORDER — SODIUM CHLORIDE 0.9% IV SOLUTION: Freq: Once | INTRAVENOUS | Status: AC

## 2021-08-26 MED ORDER — ALPRAZOLAM 0.25 MG PO TABS
0.2500 mg | ORAL_TABLET | Freq: Once | ORAL | Status: AC
  Administered 2021-08-26: 0.25 mg via ORAL
  Filled 2021-08-26: qty 1

## 2021-08-26 MED ORDER — NICOTINE 14 MG/24HR TD PT24: 14.0000 mg | MEDICATED_PATCH | TRANSDERMAL | Status: DC

## 2021-08-26 MED ORDER — ACETAMINOPHEN 325 MG PO TABS
650.0000 mg | ORAL_TABLET | Freq: Four times a day (QID) | ORAL | Status: DC | PRN
Start: 2021-08-26 — End: 2021-09-24
  Administered 2021-08-26 – 2021-09-21 (×19): 650 mg via ORAL
  Filled 2021-08-26 (×20): qty 2

## 2021-08-26 NOTE — Progress Notes (Signed)
Patient ID: Caleb Simmons, male   DOB: 07/13/1951, 70 y.o.   MRN: [id number]    Referring Physician(s): Singh,V  Supervising Physician: Corrie Mckusick  Patient Status:  [facility] - In-pt  Chief Complaint: Acute kidney injury; request received for random renal biopsy   Subjective: Patient doing okay this a.m.; does report small amount of bleeding from nose and back of throat; denies fever, headache, chest pain, dyspnea, cough, abdominal/back pain, nausea, vomiting, dysuria /hematuria or blood in stool; hgb 6.8 this am- getting transfusion   Allergies: Patient has no known allergies.  Medications: Prior to Admission medications   Medication Sig Start Date End Date Taking? Authorizing Provider  amoxicillin-clavulanate (AUGMENTIN) 875-125 MG tablet Take 1 tablet by mouth 2 (two) times daily. 08/14/21  Yes [provider]  Aspirin-Salicylamide-Caffeine (BC FAST PAIN RELIEF) 650-195-33.3 MG PACK Take 1 Package by mouth daily as needed (pain).   Yes [provider]  promethazine-dextromethorphan (PROMETHAZINE-DM) 6.25-15 MG/5ML syrup Take 5 mLs by mouth every 4 (four) hours as needed for cough. 08/14/21  Yes [provider]     Vital Signs: BP 139/84   Pulse 80   Temp 98.2 F (36.8 C) (Oral)   Resp 16   Ht 5\' 10"  (1.778 m)   Wt 180 lb (81.6 kg)   SpO2 95%   BMI 25.83 kg/m   Physical Exam awake, alert.  Chest clear to auscultation bilaterally.  Heart with regular rate and rhythm.  Abdomen soft, positive bowel sounds, nontender.  No lower extremity edema.  Imaging: DG Chest 2 View  Result Date: 08/23/2021 CLINICAL DATA:  Near syncope. EXAM: CHEST - 2 VIEW COMPARISON:  None. FINDINGS: The lungs are clear without focal pneumonia, edema, pneumothorax or pleural effusion. Cardiopericardial silhouette is at upper limits of normal for size. The visualized bony structures of the thorax show no acute abnormality. Telemetry leads overlie the chest. IMPRESSION: No active  cardiopulmonary disease. Electronically Signed   By: Misty Stanley M.D.   On: 08/23/2021 09:15   NM Pulmonary Perfusion  Result Date: 08/23/2021 CLINICAL DATA:  PE suspected, shortness of breath EXAM: NUCLEAR MEDICINE PERFUSION LUNG SCAN TECHNIQUE: Perfusion images were obtained in multiple projections after intravenous injection of radiopharmaceutical. Ventilation scans intentionally deferred if perfusion scan and chest x-ray adequate for interpretation during COVID 19 epidemic. RADIOPHARMACEUTICALS:  4.2 mCi Tc-25m MAA IV COMPARISON:  Same-day chest radiographs FINDINGS: Normal, homogeneous pulmonary perfusion. No suspicious filling defects. IMPRESSION: Very low probability examination for pulmonary embolism by modified perfusion only PIOPED criteria (PE absent). Electronically Signed   By: Eddie Candle M.D.   On: 08/23/2021 15:59   ECHOCARDIOGRAM COMPLETE  Result Date: 08/24/2021    ECHOCARDIOGRAM REPORT   Patient Name:   Caleb Simmons Date of Exam: 08/24/2021 Medical Rec #:  [id number]    Height:       70.0 in Accession #:    [id number]   Weight:       180.0 lb Date of Birth:  03/01/1951     BSA:          1.996 m Patient Age:    55 years     BP:           120/69 mmHg Patient Gender: M            HR:           85 bpm. Exam Location:  Inpatient Procedure: 2D Echo, Cardiac Doppler and Color Doppler Indications:    Abnormal EKG  History:  Patient has no prior history of Echocardiogram examinations.                 COPD, Signs/Symptoms:Dyspnea and Weakness, renal disease,                 anemia, elevated troponin; Risk Factors:Current Smoker.  Sonographer:    Dustin Flock RDCS Referring Phys: [place]  Sonographer Comments: Image acquisition challenging due to COPD. IMPRESSIONS  1. Left ventricular ejection fraction, by estimation, is 55 to 60%. The left ventricle has normal function. The left ventricle has no regional wall motion abnormalities. There is mild concentric left ventricular  hypertrophy. Left ventricular diastolic parameters are consistent with Grade I diastolic dysfunction (impaired relaxation).  2. Right ventricular systolic function is normal. The right ventricular size is normal. Tricuspid regurgitation signal is inadequate for assessing PA pressure.  3. The mitral valve is grossly normal. No evidence of mitral valve regurgitation. No evidence of mitral stenosis.  4. The aortic valve was not well visualized. Aortic valve regurgitation is not visualized. No aortic stenosis is present.  5. The inferior vena cava is normal in size with greater than 50% respiratory variability, suggesting right atrial pressure of 3 mmHg. Comparison(s): No prior Echocardiogram. FINDINGS  Left Ventricle: Left ventricular ejection fraction, by estimation, is 55 to 60%. The left ventricle has normal function. The left ventricle has no regional wall motion abnormalities. The left ventricular internal cavity size was normal in size. There is  mild concentric left ventricular hypertrophy. Left ventricular diastolic parameters are consistent with Grade I diastolic dysfunction (impaired relaxation). Right Ventricle: The right ventricular size is normal. No increase in right ventricular wall thickness. Right ventricular systolic function is normal. Tricuspid regurgitation signal is inadequate for assessing PA pressure. Left Atrium: Left atrial size was normal in size. Right Atrium: Right atrial size was normal in size. Pericardium: There is no evidence of pericardial effusion. Mitral Valve: The mitral valve is grossly normal. No evidence of mitral valve regurgitation. No evidence of mitral valve stenosis. Tricuspid Valve: The tricuspid valve is normal in structure. Tricuspid valve regurgitation is not demonstrated. No evidence of tricuspid stenosis. Aortic Valve: The aortic valve was not well visualized. Aortic valve regurgitation is not visualized. No aortic stenosis is present. Pulmonic Valve: The pulmonic  valve was not well visualized. Pulmonic valve regurgitation is not visualized. Aorta: The aortic root is normal in size and structure. Venous: The inferior vena cava is normal in size with greater than 50% respiratory variability, suggesting right atrial pressure of 3 mmHg. IAS/Shunts: The atrial septum is grossly normal.  LEFT VENTRICLE PLAX 2D LVIDd:         5.80 cm      Diastology LVIDs:         3.60 cm      LV e' medial:    5.55 cm/s LV PW:         1.30 cm      LV E/e' medial:  14.5 LV IVS:        1.30 cm      LV e' lateral:   6.74 cm/s LVOT diam:     2.40 cm      LV E/e' lateral: 12.0 LV SV:         76 LV SV Index:   38 LVOT Area:     4.52 cm  LV Volumes (MOD) LV vol d, MOD A4C: 124.0 ml LV vol s, MOD A4C: 47.8 ml LV SV MOD A4C:  124.0 ml RIGHT VENTRICLE RV Basal diam:  2.80 cm RV S prime:     10.10 cm/s TAPSE (M-mode): 2.9 cm LEFT ATRIUM             Index       RIGHT ATRIUM           Index LA diam:        3.80 cm 1.90 cm/m  RA Area:     13.10 cm LA Vol (A2C):   31.8 ml 15.93 ml/m RA Volume:   29.70 ml  14.88 ml/m LA Vol (A4C):   33.0 ml 16.53 ml/m LA Biplane Vol: 34.9 ml 17.49 ml/m  AORTIC VALVE LVOT Vmax:   98.30 cm/s LVOT Vmean:  60.300 cm/s LVOT VTI:    0.169 m  AORTA Ao Root diam: 3.30 cm MITRAL VALVE MV Area (PHT): 3.85 cm    SHUNTS MV Decel Time: 197 msec    Systemic VTI:  0.17 m MV E velocity: 80.60 cm/s  Systemic Diam: 2.40 cm MV A velocity: 49.30 cm/s MV E/A ratio:  1.63 Rudean Haskell MD Electronically signed by Rudean Haskell MD Signature Date/Time: 08/24/2021/11:38:40 AM    Final    CT Renal Stone Study  Result Date: 08/23/2021 CLINICAL DATA:  Hematuria, new renal failure EXAM: CT ABDOMEN AND PELVIS WITHOUT CONTRAST TECHNIQUE: Multidetector CT imaging of the abdomen and pelvis was performed following the standard protocol without IV contrast. COMPARISON:  06/26/2007 FINDINGS: Lower chest: There is mild, scattered, nonspecific ground-glass and fine nodularity throughout the  included bilateral lung bases (series 5, 4). Hepatobiliary: No solid liver abnormality is seen. No gallstones, gallbladder wall thickening, or biliary dilatation. Pancreas: Unremarkable. No pancreatic ductal dilatation or surrounding inflammatory changes. Spleen: Normal in size without significant abnormality. Adrenals/Urinary Tract: Adrenal glands are unremarkable. Kidneys are normal, without renal calculi, solid lesion, or hydronephrosis. Distended urinary bladder, measuring at least 17.4 cm. Stomach/Bowel: Stomach is within normal limits. Appendix not clearly visualized and may be surgically. No evidence of bowel wall thickening, distention, or inflammatory changes. Descending and sigmoid diverticulosis. Vascular/Lymphatic: Aortic atherosclerosis. No enlarged abdominal or pelvic lymph nodes. Reproductive: No mass or other significant abnormality. Other: Small, fat containing bilateral inguinal hernias no abdominopelvic ascites. Musculoskeletal: No acute or significant osseous findings. IMPRESSION: 1. No evidence of urinary tract calculus or hydronephrosis. 2. Distended urinary bladder, measuring at least 17.4 cm. Correlate for urinary retention. 3. Descending and sigmoid diverticulosis without evidence of acute diverticulitis. 4. Mild, nonspecific scattered ground-glass and fine nodularity throughout the bilateral lung bases, possibly infectious or inflammatory. Aortic Atherosclerosis (ICD10-I70.0). Electronically Signed   By: Eddie Candle M.D.   On: 08/23/2021 16:04    Labs:  CBC: Recent Labs    08/23/21 0820 08/23/21 2018 08/24/21 0055 08/24/21 1128 08/25/21 0758 08/26/21 0006  WBC 14.2*  --  17.2*  --  9.5 10.7*  HGB 6.0*   < > 6.0* 7.1* 7.8* 6.8*  HCT 20.5*   < > 18.6* 22.9* 24.9* 21.3*  PLT 421*  --  310  --  282 261   < > = values in this interval not displayed.    COAGS: Recent Labs    08/24/21 1128  INR 1.6*    BMP: Recent Labs    08/23/21 0820 08/24/21 0055 08/25/21 0758  08/26/21 0006  NA 138 137 141 137  K 4.4 4.5 4.2 4.0  CL 111 110 113* 111  CO2 16* 16* 20* 19*  GLUCOSE 97 93 84 106*  BUN 44*  41* 39* 40*  CALCIUM 8.0* 7.7* 8.4* 7.8*  CREATININE 6.56* 6.12* 5.71* 5.52*  GFRNONAA 8* 9* 10* 10*    LIVER FUNCTION TESTS: Recent Labs    08/23/21 1037 08/24/21 0055 08/25/21 0758 08/26/21 0006  BILITOT 0.3 0.4  --   --   AST 17 14*  --   --   ALT 10 10  --   --   ALKPHOS 66 57  --   --   PROT 5.6* 4.8*  --   --   ALBUMIN 2.2* 1.9* 1.9* 1.7*    Assessment and Plan: Patient with history of acute kidney injury with prior excessive NSAID use, prior nephrolithiasis and hematuria, anemia with concern for possible vasculitis picture; request received for random renal biopsy; initial consult  done on 9/2; currently afebrile with BP 139/84 ;tentative plan is to proceed with renal biopsy on 9/6 if patient stable.  Risks of biopsy, including but not limited to, internal bleeding, infection, injury to adjacent structures discussed with patient with his understanding and consent.  We will recheck labs on 9/6.  Electronically Signed: D. Rowe Robert, PA-C 08/26/2021, 10:59 AM   I spent a total of 20 minutes at the the patient's bedside AND on the patient's hospital floor or unit, greater than 50% of which was counseling/coordinating care for image guided random renal biopsy

## 2021-08-26 NOTE — Progress Notes (Signed)
PROGRESS NOTE        PATIENT DETAILS Name: Caleb Simmons Age: 70 y.o. Sex: male Date of Birth: Aug 27, 1951 Admit Date: 08/23/2021 Admitting Physician Costin Karlyne Greenspan, MD PCP:Pcp, No  Brief Narrative: Patient is a 70 y.o. male with history of nephrolithiasis-presenting with generalized weakness/shortness of breath-found to have AKI and severe normocytic anemia.  Admitted to the hospitalist service for further evaluation and treatment.  Significant events: 9/1>> admit for evaluation of AKI/severe anemia.  Significant studies: 9/1>> CXR: No pneumonia 9/1>> VQ scan: No PE 9/1>> CT renal stone study: No hydronephrosis, distended bladder.  Diverticulosis. 9/1>> FOBT: Negative 9/1>> UA: Protein>> 300, RBCs 11-20/hpf 9/1>> HIV: Nonreactive 9/1>> HBsAg/HCV Ab: Nonreactive 9/1>> C3/C4: Normal limits 9/1>> glomerular basement Ab: Negative 9/1>> ANA: Negative 9/1>> dsDNA: Negative 9/1>> ANCA titers: Negative 9/1>> SPEP: Pending 9/2>> Echo: EF 92-42%, grade 1 diastolic dysfunction. 9/4>>Folic acid :6.8(TM[facility]) 9/6>>QIW [id number]   Antimicrobial therapy: None  Microbiology data: 9/1>> COVID PCR: Negative  Procedures : None  Consults: Nephrology, IR  DVT Prophylaxis : Place and maintain sequential compression device Start: 08/24/21 0519   Subjective: Continues to have anxiety-when asked if he had anxiety issues prior to this hospitalization he denies.  Understands that if he desires antianxiety medications post discharge-then we need to talk about nonbenzodiazepine alternatives like SSRI-he tells me he just wants to use Xanax while in the hospital-does not want to be started on any medication when he goes home.  Claims he likes Xanax because it helps him to sleep at night.  Had brown-colored stools earlier this morning.  Assessment/Plan: Acute kidney injury: Improving with just supportive care.  Suspicion for glomerular pathology given significant  proteinuria and RBCs in urine.  Extensive serological work-up negative so far.  Renal biopsy planned for 9/6.  Foley catheter removed on 9/3-voiding well.   Intake/Output Summary (Last 24 hours) at 08/26/2021 1417 Last data filed at 08/26/2021 1006 Gross per 24 hour  Intake 2568.26 ml  Output 2950 ml  Net -381.74 ml     Microcytic anemia: Drop in hemoglobin again-no overt blood loss-brown stools earlier this morning.  No indication of hemolysis-LDH/bilirubin levels within normal limit.  Folate acid levels are low-vitamin B12 levels are borderline low.  Will begin folic acid and J19 supplementation.  On IV iron/Aranesp per nephrology.  Getting 30 days of PRBC this morning.  Follow CBC closely.  .  Minimally elevated troponins: Trend is flat-not consistent with ACS-he does not have any chest pain.  Exertional dyspnea was from symptomatic anemia.  Suspect elevated troponin is in the setting of kidney failure.  Echo without any wall motion malady and preserved EF.  Doubt any further work-up is required-especially in light of severity of anemia and AKI.  Anxiety: Continue Xanax while he is in the hospital-after discussion with patient today-we will add trazodone to see if that will help him sleep.  Explained that if he has anxiety issues-good idea to start SSRI-not a good thing to be on benzodiazepines long-term.  Claims that he does not need any anxiety medications on discharge.  Tobacco abuse: Start transdermal nicotine-counseled prior to discharge.  Diet: Diet Order             Diet NPO time specified Except for: Sips with Meds  Diet effective midnight           Diet renal with  fluid restriction Room service appropriate? Yes; Fluid consistency: Thin  Diet effective now                    Code Status: Full code   Family Communication: Son at bedside.  Disposition Plan: Status is: Inpatient  Remains inpatient appropriate because:Inpatient level of care appropriate due to severity  of illness  Dispo: The patient is from: Home              Anticipated d/c is to: Home              Patient currently is not medically stable to d/c.   Difficult to place patient No     Barriers to Discharge: Severe AKI-severe anemia requiring PRBC transfusion-extensive serological work-up pending-renal biopsy ordered.  Clearly not stable for discharge at this point.  Antimicrobial agents: Anti-infectives (From admission, onward)    None        Time spent: 35 minutes-Greater than 50% of this time was spent in counseling, explanation of diagnosis, planning of further management, and coordination of care.  MEDICATIONS: Scheduled Meds:  cyanocobalamin  1,000 mcg Subcutaneous [id number]   folic acid  2 mg Oral Daily   melatonin  5 mg Oral QHS   [START ON 08/27/2021] nicotine  14 mg Transdermal Q24H   nicotine  14 mg Transdermal Once   pantoprazole  40 mg Oral BID AC   sodium bicarbonate  1,300 mg Oral TID   tamsulosin  0.4 mg Oral Daily   Continuous Infusions:  sodium chloride     sodium chloride 75 mL/hr at 08/26/21 0659   ferric gluconate (FERRLECIT) IVPB 250 mg (08/26/21 0921)   PRN Meds:.ALPRAZolam, bisacodyl, ondansetron **OR** ondansetron (ZOFRAN) IV, promethazine, technetium albumin aggregated   PHYSICAL EXAM: Vital signs: Vitals:   08/26/21 0525 08/26/21 0744 08/26/21 0812 08/26/21 1211  BP: 128/71 (!) 142/81 139/84 (!) 145/90  Pulse: 72 78 80 79  Resp: 17 15 16 15   Temp: 98 F (36.7 C) 98.2 F (36.8 C) 98.2 F (36.8 C) 98.2 F (36.8 C)  TempSrc: Oral Oral Oral Oral  SpO2: 94% 97% 95% 97%  Weight:      Height:       Filed Weights   08/23/21 0816  Weight: 81.6 kg   Body mass index is 25.83 kg/m.   Gen Exam:Alert awake-not in any distress HEENT:atraumatic, normocephalic Chest: B/L clear to auscultation anteriorly CVS:S1S2 regular Abdomen:soft non tender, non distended Extremities:no edema Neurology: Non focal Skin: no rash   I have personally  reviewed following labs and imaging studies  LABORATORY DATA: CBC: Recent Labs  Lab 08/23/21 0820 08/23/21 2018 08/24/21 0055 08/24/21 1128 08/25/21 0758 08/26/21 0006 08/26/21 1036  WBC 14.2*  --  17.2*  --  9.5 10.7* 12.1*  HGB 6.0*   < > 6.0* 7.1* 7.8* 6.8* 8.8*  HCT 20.5*   < > 18.6* 22.9* 24.9* 21.3* 27.4*  MCV 79.5*  --  78.5*  --  81.4 81.3 81.5  PLT 421*  --  310  --  282 261 253   < > = values in this interval not displayed.     Basic Metabolic Panel: Recent Labs  Lab 08/23/21 0820 08/23/21 1037 08/24/21 0055 08/25/21 0037 08/25/21 0758 08/26/21 0006  NA 138  --  137  --  141 137  K 4.4  --  4.5  --  4.2 4.0  CL 111  --  110  --  113* 111  CO2  16*  --  16*  --  20* 19*  GLUCOSE 97  --  93  --  84 106*  BUN 44*  --  41*  --  39* 40*  CREATININE 6.56*  --  6.12*  --  5.71* 5.52*  CALCIUM 8.0*  --  7.7*  --  8.4* 7.8*  MG  --  1.6* 1.5* 2.1  --   --   PHOS  --   --  5.6*  --  5.6* 4.3     GFR: Estimated Creatinine Clearance: 12.7 mL/min (A) (by C-G formula based on SCr of 5.52 mg/dL (H)).  Liver Function Tests: Recent Labs  Lab 08/23/21 1037 08/24/21 0055 08/25/21 0758 08/26/21 0006 08/26/21 1036  AST 17 14*  --   --  14*  ALT 10 10  --   --  9  ALKPHOS 66 57  --   --  56  BILITOT 0.3 0.4  --   --  0.8  PROT 5.6* 4.8*  --   --  5.0*  ALBUMIN 2.2* 1.9* 1.9* 1.7* 1.9*    Recent Labs  Lab 08/23/21 1037  LIPASE 70*    No results for input(s): AMMONIA in the last 168 hours.  Coagulation Profile: Recent Labs  Lab 08/24/21 1128  INR 1.6*     Cardiac Enzymes: No results for input(s): CKTOTAL, CKMB, CKMBINDEX, TROPONINI in the last 168 hours.  BNP (last 3 results) No results for input(s): PROBNP in the last 8760 hours.  Lipid Profile: No results for input(s): CHOL, HDL, LDLCALC, TRIG, CHOLHDL, LDLDIRECT in the last 72 hours.  Thyroid Function Tests: No results for input(s): TSH, T4TOTAL, FREET4, T3FREE, THYROIDAB in the last 72  hours.  Anemia Panel: Recent Labs    08/23/21 1703 08/26/21 1036  VITAMINB12  --  298  FOLATE  --  5.5*  FERRITIN 20*  --   TIBC 350  --   IRON 12*  --   RETICCTPCT  --  1.5     Urine analysis:    Component Value Date/Time   COLORURINE STRAW (A) 08/23/2021 1140   APPEARANCEUR CLEAR 08/23/2021 1140   LABSPEC 1.008 08/23/2021 1140   PHURINE 6.0 08/23/2021 1140   GLUCOSEU 50 (A) 08/23/2021 1140   HGBUR SMALL (A) 08/23/2021 1140   BILIRUBINUR NEGATIVE 08/23/2021 1140   KETONESUR NEGATIVE 08/23/2021 1140   PROTEINUR >=300 (A) 08/23/2021 1140   UROBILINOGEN 0.2 06/26/2007 1023   NITRITE NEGATIVE 08/23/2021 1140   LEUKOCYTESUR NEGATIVE 08/23/2021 1140    Sepsis Labs: Lactic Acid, Venous No results found for: LATICACIDVEN  MICROBIOLOGY: Recent Results (from the past 240 hour(s))  SARS CORONAVIRUS 2 (TAT 6-24 HRS) Nasopharyngeal Nasopharyngeal Swab     Status: None   Collection Time: 08/23/21  5:11 PM   Specimen: Nasopharyngeal Swab  Result Value Ref Range Status   SARS Coronavirus 2 NEGATIVE NEGATIVE Final    Comment: (NOTE) SARS-CoV-2 target nucleic acids are NOT DETECTED.  The SARS-CoV-2 RNA is generally detectable in upper and lower respiratory specimens during the acute phase of infection. Negative results do not preclude SARS-CoV-2 infection, do not rule out co-infections with other pathogens, and should not be used as the sole basis for treatment or other patient management decisions. Negative results must be combined with clinical observations, patient history, and epidemiological information. The expected result is Negative.  Fact Sheet for Patients: SugarRoll.be  Fact Sheet for Healthcare Providers: https://www.woods-mathews.com/  This test is not yet approved or cleared by the  [place] Architectural technologist and  has been authorized for detection and/or diagnosis of SARS-CoV-2 by FDA under an Print production planner  (EUA). This EUA will remain  in effect (meaning this test can be used) for the duration of the COVID-19 declaration under Se ction 564(b)(1) of the Act, 21 U.S.C. section 360bbb-3(b)(1), unless the authorization is terminated or revoked sooner.  Performed at [facility], [address] [place], [place] [address]     RADIOLOGY STUDIES/RESULTS: No results found.   LOS: 3 days   Oren Binet, MD  [facility]    To contact the attending provider between 7A-7P or the covering provider during after hours 7P-7A, please log into the web site [email or web address] and access using universal [place] password for that web site. If you do not have the password, please call the hospital operator.  08/26/2021, 2:17 PM

## 2021-08-26 NOTE — Congregational Nurse Program (Addendum)
HOSPITAL MEDICINE OVERNIGHT EVENT NOTE    Notified by nursing the patient's hemoglobin is 6.8 this morning.  Patient has already received a blood transfusion earlier in the hospitalization which was thought to be due to multifactorial anemia.  According to nursing patient denies chest pain, shortness of breath or lightheadedness.  Patient is currently hemodynamically stable.  Patient is not exhibiting any evidence of gross bleeding.  Ordering 1 unit of packed red blood cell transfusion with posttransfusion hemoglobin and hematocrit additionally ordered.  Vernelle Emerald  MD [facility]

## 2021-08-26 NOTE — Progress Notes (Signed)
  [facility] Progress Note    Assessment/ Plan:    AKI, improving: no history of renal function issues, does have a history of excessive NSAID use (BC powders daily).  AKI with Cr 6.95.  Known history of nephrolithiasis- CT scan without obstruction/stones. UA with > 300 mg protein and 11-20 RBCs/ hpf.  ANA, ANCA, C3/4, SPEP, anti-GBM, ds DNA, Hep B/C serologies, HIV- all negative/WNL Given hematuria without stones/obstruction, worsening anemia, AKI, sinus issues-there is a concern for a vasculitis picture, will proceed with native kidney biopsy which which is planned for Tues 9/6 No indication for renal replacement therapy at this time Daily labs, strict I/O Foley d/c'ed, has been voiding well Cr down to 5.5 today Anemia: FOBT negative, getting pRBCs. Likely the cause of his near syncope- EKG OK and trops flat. Iron deficient on iron panel. Ferrlecit ordered x 4 doses Elevated d-dimer: vq scan low probability of PE Nephrolithiasis: sees Dr Johney Frame obstruction on ct renal stone protocol Metabolic acidosis: on nahco3, increased to tid Dispo: admitted  Gean Quint, MD [facility]   Subjective:   No acute events, he reports that he has been urinating a lot. No complaints. Uop ~4.2L   Objective:   BP 139/84   Pulse 80   Temp 98.2 F (36.8 C) (Oral)   Resp 16   Ht 5\' 10"  (1.778 m)   Wt 81.6 kg   SpO2 95%   BMI 25.83 kg/m   Intake/Output Summary (Last 24 hours) at 08/26/2021 1147 Last data filed at 08/26/2021 1006 Gross per 24 hour  Intake 2568.26 ml  Output 3050 ml  Net -481.74 ml   Weight change:   Physical Exam: Gen:nad CVS:rrr Resp:cta bl PQZ:RAQT, nt/nd Ext:no edema Skin: no rash Neuro: awake, alert  Imaging: No results found.  Labs: BMET Recent Labs  Lab 08/23/21 0820 08/24/21 0055 08/25/21 0758 08/26/21 0006  NA 138 137 141 137  K 4.4 4.5 4.2 4.0  CL 111 110 113* 111  CO2 16* 16* 20* 19*  GLUCOSE 97 93 84 106*  BUN  44* 41* 39* 40*  CREATININE 6.56* 6.12* 5.71* 5.52*  CALCIUM 8.0* 7.7* 8.4* 7.8*  PHOS  --  5.6* 5.6* 4.3   CBC Recent Labs  Lab 08/24/21 0055 08/24/21 1128 08/25/21 0758 08/26/21 0006 08/26/21 1036  WBC 17.2*  --  9.5 10.7* 12.1*  HGB 6.0* 7.1* 7.8* 6.8* 8.8*  HCT 18.6* 22.9* 24.9* 21.3* 27.4*  MCV 78.5*  --  81.4 81.3 81.5  PLT 310  --  282 261 253    Medications:     melatonin  5 mg Oral QHS   nicotine  14 mg Transdermal Q24H   pantoprazole  40 mg Oral BID AC   sodium bicarbonate  1,300 mg Oral TID   tamsulosin  0.4 mg Oral Daily      Gean Quint, MD [place] 08/26/2021, 11:47 AM

## 2021-08-27 DIAGNOSIS — D649 Anemia, unspecified: Secondary | ICD-10-CM | POA: Diagnosis not present

## 2021-08-27 DIAGNOSIS — N179 Acute kidney failure, unspecified: Secondary | ICD-10-CM | POA: Diagnosis not present

## 2021-08-27 DIAGNOSIS — R778 Other specified abnormalities of plasma proteins: Secondary | ICD-10-CM | POA: Diagnosis not present

## 2021-08-27 LAB — BPAM RBC
Blood Product Expiration Date: 202209102359
Blood Product Expiration Date: 202209242359
Blood Product Expiration Date: 202209252359
ISSUE DATE / TIME: 202209011222
ISSUE DATE / TIME: 202209020639
ISSUE DATE / TIME: 202209040501
Unit Type and Rh: 600
Unit Type and Rh: 600
Unit Type and Rh: 600

## 2021-08-27 LAB — CBC
HCT: 26.4 % — ABNORMAL LOW (ref 39.0–52.0)
Hemoglobin: 8.3 g/dL — ABNORMAL LOW (ref 13.0–17.0)
MCH: 25.9 pg — ABNORMAL LOW (ref 26.0–34.0)
MCHC: 31.4 g/dL (ref 30.0–36.0)
MCV: 82.5 fL (ref 80.0–100.0)
Platelets: 280 10*3/uL (ref 150–400)
RBC: 3.2 MIL/uL — ABNORMAL LOW (ref 4.22–5.81)
RDW: 18.3 % — ABNORMAL HIGH (ref 11.5–15.5)
WBC: 10.7 10*3/uL — ABNORMAL HIGH (ref 4.0–10.5)
nRBC: 0 % (ref 0.0–0.2)

## 2021-08-27 LAB — RENAL FUNCTION PANEL
Albumin: 1.8 g/dL — ABNORMAL LOW (ref 3.5–5.0)
Anion gap: 7 (ref 5–15)
BUN: 35 mg/dL — ABNORMAL HIGH (ref 8–23)
CO2: 21 mmol/L — ABNORMAL LOW (ref 22–32)
Calcium: 8.2 mg/dL — ABNORMAL LOW (ref 8.9–10.3)
Chloride: 111 mmol/L (ref 98–111)
Creatinine, Ser: 5.09 mg/dL — ABNORMAL HIGH (ref 0.61–1.24)
GFR, Estimated: 11 mL/min — ABNORMAL LOW (ref >60)
Glucose, Bld: 92 mg/dL (ref 70–99)
Phosphorus: 4.3 mg/dL (ref 2.5–4.6)
Potassium: 3.8 mmol/L (ref 3.5–5.1)
Sodium: 139 mmol/L (ref 135–145)

## 2021-08-27 LAB — TYPE AND SCREEN
ABO/RH(D): A NEG
Antibody Screen: NEGATIVE
Unit division: 0
Unit division: 0
Unit division: 0

## 2021-08-27 NOTE — Progress Notes (Signed)
[facility] NEPHROLOGY PROGRESS NOTE  Assessment/ Plan:  #Acute kidney injury, nonoliguric, with history of excessive NSAID's use/BC powders every day.  Peaked creatinine level of 6.95.  CT scan without obstruction or hydronephrosis.  UA with more than 300 protein and microscopic hematuria.  Serology evaluation including ANA, ANCA, C3, C4, SPEP, anti-GBM, double-stranded DNA, hep B, hep C, HIV negative.  IR following for random kidney biopsy planned for tomorrow. The serum creatinine level continue to improve.  No uremic symptoms.  Recommend strict ins and out, daily lab.  #Anemia: FOBT negative, transfuse blood as needed.  Treated with IV iron.  #History of nephrolithiasis: Repeat CT scan without any obstruction or hydronephrosis.  #Metabolic acidosis: Serum CO2 level improving with oral sodium bicarbonate.  Discussed with the patient and his daughter-in-law at bedside. Also discussed with the patient's nurse and primary team.  Subjective: Seen and examined.  He is complaining of thumb joint pain where he has IV line nearby.  Reportedly had some trauma in the past.  No nausea, vomiting, chest pain, shortness of breath.  Urine output around 3.5 L in 24 hours.  Objective Vital signs in last 24 hours: Vitals:   08/27/21 0010 08/27/21 0430 08/27/21 0811 08/27/21 1220  BP: (!) 151/92 128/79  (!) 162/88  Pulse: 76 76  81  Resp: 17 16  16   Temp: 98 F (36.7 C) 99.3 F (37.4 C) 97.9 F (36.6 C) 98 F (36.7 C)  TempSrc:  Oral Oral Axillary  SpO2: 94% 99% 99%   Weight:      Height:       Weight change:   Intake/Output Summary (Last 24 hours) at 08/27/2021 1438 Last data filed at 08/27/2021 1200 Gross per 24 hour  Intake 120 ml  Output 4850 ml  Net -4730 ml       Labs: Basic Metabolic Panel: Recent Labs  Lab 08/25/21 0758 08/26/21 0006 08/27/21 0029  NA 141 137 139  K 4.2 4.0 3.8  CL 113* 111 111  CO2 20* 19* 21*  GLUCOSE 84 106* 92  BUN 39* 40* 35*   CREATININE 5.71* 5.52* 5.09*  CALCIUM 8.4* 7.8* 8.2*  PHOS 5.6* 4.3 4.3   Liver Function Tests: Recent Labs  Lab 08/23/21 1037 08/24/21 0055 08/25/21 0758 08/26/21 0006 08/26/21 1036 08/27/21 0029  AST 17 14*  --   --  14*  --   ALT 10 10  --   --  9  --   ALKPHOS 66 57  --   --  56  --   BILITOT 0.3 0.4  --   --  0.8  --   PROT 5.6* 4.8*  --   --  5.0*  --   ALBUMIN 2.2* 1.9*   < > 1.7* 1.9* 1.8*   < > = values in this interval not displayed.   Recent Labs  Lab 08/23/21 1037  LIPASE 70*   No results for input(s): AMMONIA in the last 168 hours. CBC: Recent Labs  Lab 08/24/21 0055 08/24/21 1128 08/25/21 0758 08/26/21 0006 08/26/21 1036 08/27/21 0029  WBC 17.2*  --  9.5 10.7* 12.1* 10.7*  HGB 6.0*   < > 7.8* 6.8* 8.8* 8.3*  HCT 18.6*   < > 24.9* 21.3* 27.4* 26.4*  MCV 78.5*  --  81.4 81.3 81.5 82.5  PLT 310  --  282 261 253 280   < > = values in this interval not displayed.   Cardiac Enzymes: No results for input(s):  CKTOTAL, CKMB, CKMBINDEX, TROPONINI in the last 168 hours. CBG: No results for input(s): GLUCAP in the last 168 hours.  Iron Studies: No results for input(s): IRON, TIBC, TRANSFERRIN, FERRITIN in the last 72 hours. Studies/Results: No results found.  Medications: Infusions:  sodium chloride     sodium chloride 10 mL/hr at 08/27/21 0014    Scheduled Medications:  cyanocobalamin  1,000 mcg Subcutaneous [id number]   folic acid  2 mg Oral Daily   melatonin  5 mg Oral QHS   nicotine  14 mg Transdermal Q24H   pantoprazole  40 mg Oral BID AC   sodium bicarbonate  1,300 mg Oral TID   tamsulosin  0.4 mg Oral Daily    have reviewed scheduled and prn medications.  Physical Exam: General:NAD, comfortable Heart:RRR, s1s2 nl Lungs:clear b/l, no crackle Abdomen:soft, Non-tender, non-distended Extremities:No edema Neurology: Alert, awake, following commands  Tresia Revolorio Tanna Furry 08/27/2021,2:38 PM  LOS: 4 days

## 2021-08-27 NOTE — Progress Notes (Signed)
PROGRESS NOTE        PATIENT DETAILS Name: Caleb Simmons Age: 70 y.o. Sex: male Date of Birth: 1951/09/12 Admit Date: 08/23/2021 Admitting Physician Costin Karlyne Greenspan, MD PCP:Pcp, No  Brief Narrative: Patient is a 70 y.o. male with history of nephrolithiasis-presenting with generalized weakness/shortness of breath-found to have AKI and severe normocytic anemia.  Admitted to the hospitalist service for further evaluation and treatment.  Significant events: 9/1>> admit for evaluation of AKI/severe anemia.  Significant studies: 9/1>> CXR: No pneumonia 9/1>> VQ scan: No PE 9/1>> CT renal stone study: No hydronephrosis, distended bladder.  Diverticulosis. 9/1>> FOBT: Negative 9/1>> UA: Protein>> 300, RBCs 11-20/hpf 9/1>> HIV: Nonreactive 9/1>> HBsAg/HCV Ab: Nonreactive 9/1>> C3/C4: Normal limits 9/1>> glomerular basement Ab: Negative 9/1>> ANA: Negative 9/1>> dsDNA: Negative 9/1>> ANCA titers: Negative 9/1>> SPEP: Pending 9/2>> Echo: EF 27-78%, grade 1 diastolic dysfunction. 9/4>>Folic acid :2.4(MPN) 3/6>>RWE R15:400   Antimicrobial therapy: None  Microbiology data: 9/1>> COVID PCR: Negative  Procedures : None  Consults: Nephrology, IR  DVT Prophylaxis : Place and maintain sequential compression device Start: 08/24/21 0519   Subjective: Lying comfortably in bed-denies any chest pain or shortness of breath.  Assessment/Plan: Acute kidney injury: Renal function improving with supportive care-suspicion for glomerular pathology given significant proteinuria and RBC in urine.  Extensive serological work-up negative so far.  Renal biopsy planned for 9/6.  Nephrology following.  Foley catheter removed on 9/3-voiding well.    Intake/Output Summary (Last 24 hours) at 08/27/2021 1428 Last data filed at 08/27/2021 1200 Gross per 24 hour  Intake 120 ml  Output 4850 ml  Net -4730 ml     Microcytic anemia: Hemoglobin stabilizing-appears to have  multifactorial anemia from AKI/acute illness/iron/vitamin B12 and folate deficiency.  Has required a total of 3 units of PRBC so far.  Continue supportive care-continue to replete iron/folate and B12.  Follow CBC.  Weekly.  Minimally elevated troponins: Trend is flat-not consistent with ACS-he does not have any chest pain.  Exertional dyspnea was from symptomatic anemia.  Suspect elevated troponin is in the setting of kidney failure.  Echo without any wall motion malady and preserved EF.  Doubt any further work-up is required-especially in light of severity of anemia and AKI.  Anxiety: Continue Xanax while he is in the hospital-is aware that we will likely not continue Xanax post discharge.  Use trazodone for sleep.  Patient is not interested in starting long-acting medications like SSRI for his anxiety-thinks that once he gets home-he will not require any further medications for his anxiety issues.  Tobacco abuse: Continue transdermal nicotine. Diet: Diet Order             Diet NPO time specified Except for: Sips with Meds  Diet effective midnight           Diet renal with fluid restriction Room service appropriate? Yes; Fluid consistency: Thin  Diet effective now                    Code Status: Full code   Family Communication: None at bedside.  Disposition Plan: Status is: Inpatient  Remains inpatient appropriate because:Inpatient level of care appropriate due to severity of illness  Dispo: The patient is from: Home              Anticipated d/c is to: Home  Patient currently is not medically stable to d/c.   Difficult to place patient No     Barriers to Discharge: Severe AKI-severe anemia requiring PRBC transfusion-extensive serological work-up pending-renal biopsy ordered.  Clearly not stable for discharge at this point.  Antimicrobial agents: Anti-infectives (From admission, onward)    None        Time spent: 35 minutes-Greater than 50% of this  time was spent in counseling, explanation of diagnosis, planning of further management, and coordination of care.  MEDICATIONS: Scheduled Meds:  cyanocobalamin  1,000 mcg Subcutaneous [id number]   folic acid  2 mg Oral Daily   melatonin  5 mg Oral QHS   nicotine  14 mg Transdermal Q24H   pantoprazole  40 mg Oral BID AC   sodium bicarbonate  1,300 mg Oral TID   tamsulosin  0.4 mg Oral Daily   Continuous Infusions:  sodium chloride     sodium chloride 10 mL/hr at 08/27/21 0014   PRN Meds:.acetaminophen, ALPRAZolam, bisacodyl, ondansetron **OR** ondansetron (ZOFRAN) IV, promethazine, technetium albumin aggregated, traZODone   PHYSICAL EXAM: Vital signs: Vitals:   08/27/21 0010 08/27/21 0430 08/27/21 0811 08/27/21 1220  BP: (!) 151/92 128/79  (!) 162/88  Pulse: 76 76  81  Resp: 17 16  16   Temp: 98 F (36.7 C) 99.3 F (37.4 C) 97.9 F (36.6 C) 98 F (36.7 C)  TempSrc:  Oral Oral Axillary  SpO2: 94% 99% 99%   Weight:      Height:       Filed Weights   08/23/21 0816  Weight: 81.6 kg   Body mass index is 25.83 kg/m.   Gen Exam:Alert awake-not in any distress HEENT:atraumatic, normocephalic Chest: B/L clear to auscultation anteriorly CVS:S1S2 regular Abdomen:soft non tender, non distended Extremities:no edema Neurology: Non focal Skin: no rash   I have personally reviewed following labs and imaging studies  LABORATORY DATA: CBC: Recent Labs  Lab 08/24/21 0055 08/24/21 1128 08/25/21 0758 08/26/21 0006 08/26/21 1036 08/27/21 0029  WBC 17.2*  --  9.5 10.7* 12.1* 10.7*  HGB 6.0* 7.1* 7.8* 6.8* 8.8* 8.3*  HCT 18.6* 22.9* 24.9* 21.3* 27.4* 26.4*  MCV 78.5*  --  81.4 81.3 81.5 82.5  PLT 310  --  282 261 253 280     Basic Metabolic Panel: Recent Labs  Lab 08/23/21 0820 08/23/21 1037 08/24/21 0055 08/25/21 0037 08/25/21 0758 08/26/21 0006 08/27/21 0029  NA 138  --  137  --  141 137 139  K 4.4  --  4.5  --  4.2 4.0 3.8  CL 111  --  110  --  113* 111 111   CO2 16*  --  16*  --  20* 19* 21*  GLUCOSE 97  --  93  --  84 106* 92  BUN 44*  --  41*  --  39* 40* 35*  CREATININE 6.56*  --  6.12*  --  5.71* 5.52* 5.09*  CALCIUM 8.0*  --  7.7*  --  8.4* 7.8* 8.2*  MG  --  1.6* 1.5* 2.1  --   --   --   PHOS  --   --  5.6*  --  5.6* 4.3 4.3     GFR: Estimated Creatinine Clearance: 13.7 mL/min (A) (by C-G formula based on SCr of 5.09 mg/dL (H)).  Liver Function Tests: Recent Labs  Lab 08/23/21 1037 08/24/21 0055 08/25/21 0758 08/26/21 0006 08/26/21 1036 08/27/21 0029  AST 17 14*  --   --  14*  --   ALT 10 10  --   --  9  --   ALKPHOS 66 57  --   --  56  --   BILITOT 0.3 0.4  --   --  0.8  --   PROT 5.6* 4.8*  --   --  5.0*  --   ALBUMIN 2.2* 1.9* 1.9* 1.7* 1.9* 1.8*    Recent Labs  Lab 08/23/21 1037  LIPASE 70*    No results for input(s): AMMONIA in the last 168 hours.  Coagulation Profile: Recent Labs  Lab 08/24/21 1128  INR 1.6*     Cardiac Enzymes: No results for input(s): CKTOTAL, CKMB, CKMBINDEX, TROPONINI in the last 168 hours.  BNP (last 3 results) No results for input(s): PROBNP in the last 8760 hours.  Lipid Profile: No results for input(s): CHOL, HDL, LDLCALC, TRIG, CHOLHDL, LDLDIRECT in the last 72 hours.  Thyroid Function Tests: No results for input(s): TSH, T4TOTAL, FREET4, T3FREE, THYROIDAB in the last 72 hours.  Anemia Panel: Recent Labs    08/26/21 1036  VITAMINB12 298  FOLATE 5.5*  RETICCTPCT 1.5     Urine analysis:    Component Value Date/Time   COLORURINE STRAW (A) 08/23/2021 1140   APPEARANCEUR CLEAR 08/23/2021 1140   LABSPEC 1.008 08/23/2021 1140   PHURINE 6.0 08/23/2021 1140   GLUCOSEU 50 (A) 08/23/2021 1140   HGBUR SMALL (A) 08/23/2021 1140   BILIRUBINUR NEGATIVE 08/23/2021 1140   KETONESUR NEGATIVE 08/23/2021 1140   PROTEINUR >=300 (A) 08/23/2021 1140   UROBILINOGEN 0.2 06/26/2007 1023   NITRITE NEGATIVE 08/23/2021 1140   LEUKOCYTESUR NEGATIVE 08/23/2021 1140    Sepsis  Labs: Lactic Acid, Venous No results found for: LATICACIDVEN  MICROBIOLOGY: Recent Results (from the past 240 hour(s))  SARS CORONAVIRUS 2 (TAT 6-24 HRS) Nasopharyngeal Nasopharyngeal Swab     Status: None   Collection Time: 08/23/21  5:11 PM   Specimen: Nasopharyngeal Swab  Result Value Ref Range Status   SARS Coronavirus 2 NEGATIVE NEGATIVE Final    Comment: (NOTE) SARS-CoV-2 target nucleic acids are NOT DETECTED.  The SARS-CoV-2 RNA is generally detectable in upper and lower respiratory specimens during the acute phase of infection. Negative results do not preclude SARS-CoV-2 infection, do not rule out co-infections with other pathogens, and should not be used as the sole basis for treatment or other patient management decisions. Negative results must be combined with clinical observations, patient history, and epidemiological information. The expected result is Negative.  Fact Sheet for Patients: SugarRoll.be  Fact Sheet for Healthcare Providers: https://www.woods-mathews.com/  This test is not yet approved or cleared by the [place] FDA and  has been authorized for detection and/or diagnosis of SARS-CoV-2 by FDA under an Emergency Use Authorization (EUA). This EUA will remain  in effect (meaning this test can be used) for the duration of the COVID-19 declaration under Se ction 564(b)(1) of the Act, 21 U.S.C. section 360bbb-3(b)(1), unless the authorization is terminated or revoked sooner.  Performed at [facility], [place] [address] [place], [place] [address]     RADIOLOGY STUDIES/RESULTS: No results found.   LOS: 4 days   Oren Binet, MD  [facility]    To contact the attending provider between 7A-7P or the covering provider during after hours 7P-7A, please log into the web site [email or web address] and access using universal [place] password for that web site. If you do not have the password, please  call the hospital operator.  08/27/2021,  2:28 PM

## 2021-08-28 ENCOUNTER — Inpatient Hospital Stay (HOSPITAL_COMMUNITY): Payer: Commercial Managed Care - PPO

## 2021-08-28 DIAGNOSIS — R778 Other specified abnormalities of plasma proteins: Secondary | ICD-10-CM | POA: Diagnosis not present

## 2021-08-28 DIAGNOSIS — D649 Anemia, unspecified: Secondary | ICD-10-CM | POA: Diagnosis not present

## 2021-08-28 DIAGNOSIS — N179 Acute kidney failure, unspecified: Secondary | ICD-10-CM | POA: Diagnosis not present

## 2021-08-28 LAB — CBC WITH DIFFERENTIAL/PLATELET
Abs Immature Granulocytes: 0.04 10*3/uL (ref 0.00–0.07)
Basophils Absolute: 0.1 10*3/uL (ref 0.0–0.1)
Basophils Relative: 1 %
Eosinophils Absolute: 0.3 10*3/uL (ref 0.0–0.5)
Eosinophils Relative: 3 %
HCT: 26.8 % — ABNORMAL LOW (ref 39.0–52.0)
Hemoglobin: 8.4 g/dL — ABNORMAL LOW (ref 13.0–17.0)
Immature Granulocytes: 0 %
Lymphocytes Relative: 26 %
Lymphs Abs: 2.4 10*3/uL (ref 0.7–4.0)
MCH: 25.8 pg — ABNORMAL LOW (ref 26.0–34.0)
MCHC: 31.3 g/dL (ref 30.0–36.0)
MCV: 82.2 fL (ref 80.0–100.0)
Monocytes Absolute: 0.7 10*3/uL (ref 0.1–1.0)
Monocytes Relative: 7 %
Neutro Abs: 5.8 10*3/uL (ref 1.7–7.7)
Neutrophils Relative %: 63 %
Platelets: 263 10*3/uL (ref 150–400)
RBC: 3.26 MIL/uL — ABNORMAL LOW (ref 4.22–5.81)
RDW: 18.6 % — ABNORMAL HIGH (ref 11.5–15.5)
WBC: 9.3 10*3/uL (ref 4.0–10.5)
nRBC: 0 % (ref 0.0–0.2)

## 2021-08-28 LAB — CBC
HCT: 26.3 % — ABNORMAL LOW (ref 39.0–52.0)
HCT: 26.5 % — ABNORMAL LOW (ref 39.0–52.0)
Hemoglobin: 8.5 g/dL — ABNORMAL LOW (ref 13.0–17.0)
Hemoglobin: 8.4 g/dL — ABNORMAL LOW (ref 13.0–17.0)
MCH: 26.6 pg (ref 26.0–34.0)
MCH: 26.2 pg (ref 26.0–34.0)
MCHC: 32.3 g/dL (ref 30.0–36.0)
MCHC: 31.7 g/dL (ref 30.0–36.0)
MCV: 82.4 fL (ref 80.0–100.0)
MCV: 82.6 fL (ref 80.0–100.0)
Platelets: 265 10*3/uL (ref 150–400)
Platelets: 298 10*3/uL (ref 150–400)
RBC: 3.19 MIL/uL — ABNORMAL LOW (ref 4.22–5.81)
RBC: 3.21 MIL/uL — ABNORMAL LOW (ref 4.22–5.81)
RDW: 18.5 % — ABNORMAL HIGH (ref 11.5–15.5)
RDW: 18.5 % — ABNORMAL HIGH (ref 11.5–15.5)
WBC: 12.3 10*3/uL — ABNORMAL HIGH (ref 4.0–10.5)
WBC: 14.5 10*3/uL — ABNORMAL HIGH (ref 4.0–10.5)
nRBC: 0 % (ref 0.0–0.2)
nRBC: 0 % (ref 0.0–0.2)

## 2021-08-28 LAB — RENAL FUNCTION PANEL
Albumin: 1.8 g/dL — ABNORMAL LOW (ref 3.5–5.0)
Anion gap: 8 (ref 5–15)
BUN: 33 mg/dL — ABNORMAL HIGH (ref 8–23)
CO2: 21 mmol/L — ABNORMAL LOW (ref 22–32)
Calcium: 8.3 mg/dL — ABNORMAL LOW (ref 8.9–10.3)
Chloride: 109 mmol/L (ref 98–111)
Creatinine, Ser: 5.03 mg/dL — ABNORMAL HIGH (ref 0.61–1.24)
GFR, Estimated: 12 mL/min — ABNORMAL LOW (ref >60)
Glucose, Bld: 93 mg/dL (ref 70–99)
Phosphorus: 4.7 mg/dL — ABNORMAL HIGH (ref 2.5–4.6)
Potassium: 3.6 mmol/L (ref 3.5–5.1)
Sodium: 138 mmol/L (ref 135–145)

## 2021-08-28 LAB — PROTEIN ELECTROPHORESIS, SERUM
A/G Ratio: 0.9 (ref 0.7–1.7)
Albumin ELP: 2.4 g/dL — ABNORMAL LOW (ref 2.9–4.4)
Alpha-1-Globulin: 0.3 g/dL (ref 0.0–0.4)
Alpha-2-Globulin: 1.1 g/dL — ABNORMAL HIGH (ref 0.4–1.0)
Beta Globulin: 1 g/dL (ref 0.7–1.3)
Gamma Globulin: 0.3 g/dL — ABNORMAL LOW (ref 0.4–1.8)
Globulin, Total: 2.7 g/dL (ref 2.2–3.9)
Total Protein ELP: 5.1 g/dL — ABNORMAL LOW (ref 6.0–8.5)

## 2021-08-28 LAB — PROTIME-INR
INR: 1.5 — ABNORMAL HIGH (ref 0.8–1.2)
Prothrombin Time: 18.2 seconds — ABNORMAL HIGH (ref 11.4–15.2)

## 2021-08-28 IMAGING — CT CT ABD-PELV W/O CM
2 of 4 series · 13 of 46 positions shown, 15 images · non-contrast
Comparison: CT abdomen pelvis-[DATE]; ultrasound-guided right
renal biopsy-earlier same day

CLINICAL DATA: Post right-sided renal biopsy, now with hematuria
and hypotension.

EXAM:
CT ABDOMEN AND PELVIS WITHOUT CONTRAST
TECHNIQUE: Multidetector CT imaging of the abdomen and pelvis was performed
following the standard protocol without IV contrast.

[Series 3: abd/ pelvis 5.0 i30f 2 · axial · 0.88mm/px · z∈[+826,+1211]mm · 10 of 93 slices shown, 12 images]
[im 8/93  soft-tissue]
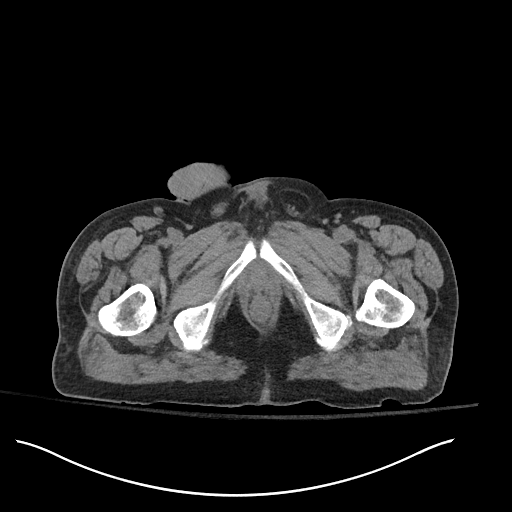
[im 8/93  bone]
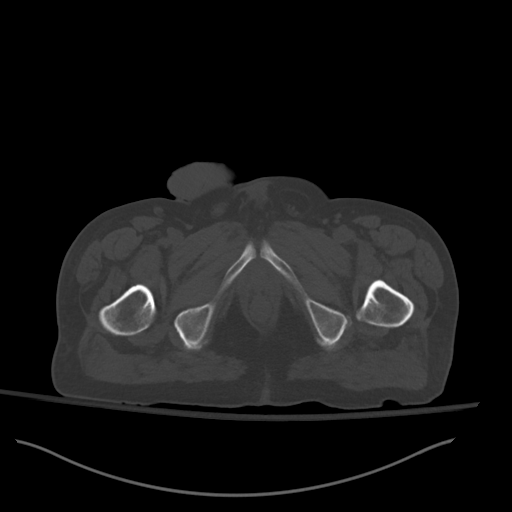
[im 15/93  soft-tissue]
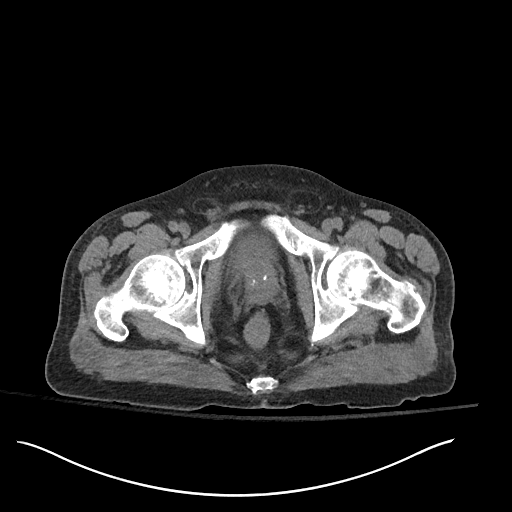
[im 26/93  soft-tissue]
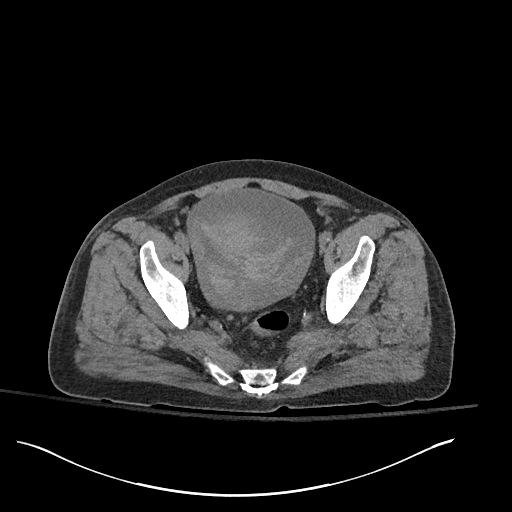
[im 34/93  soft-tissue]
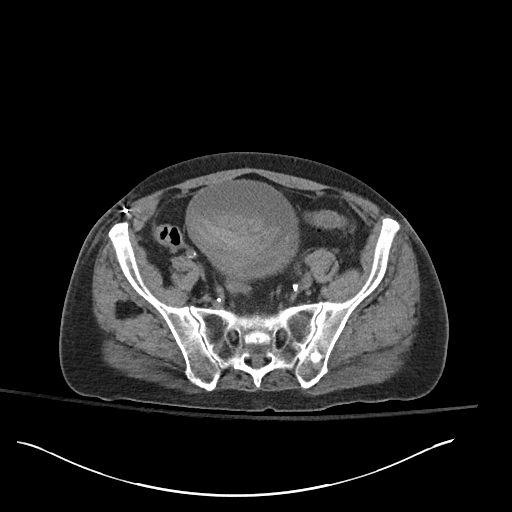
[im 41/93  soft-tissue]
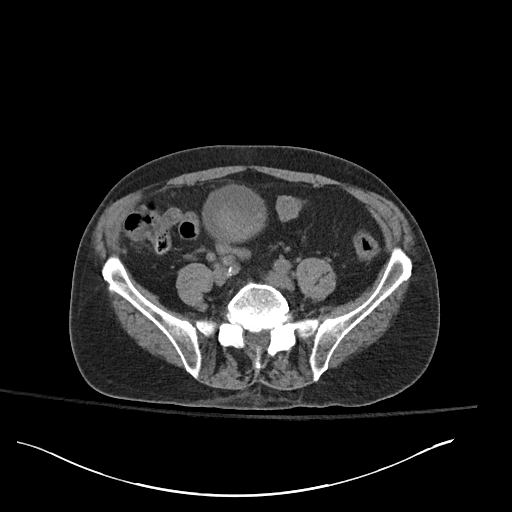
[im 52/93  soft-tissue]
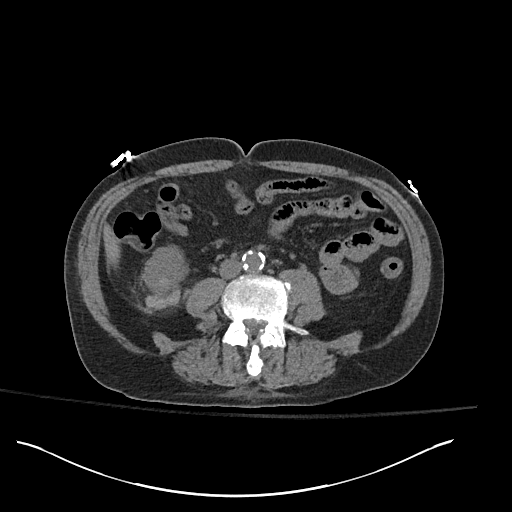
[im 59/93  soft-tissue]
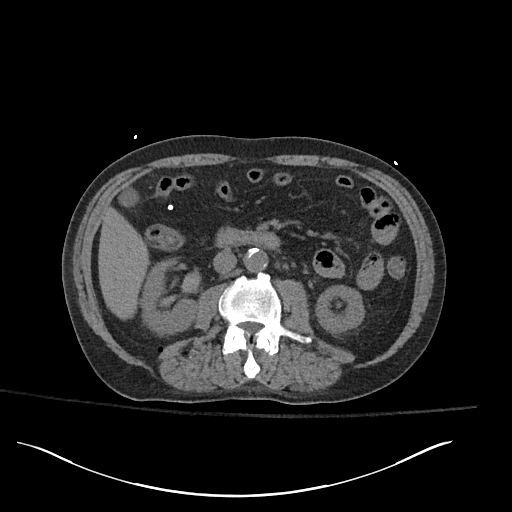
[im 70/93  soft-tissue]
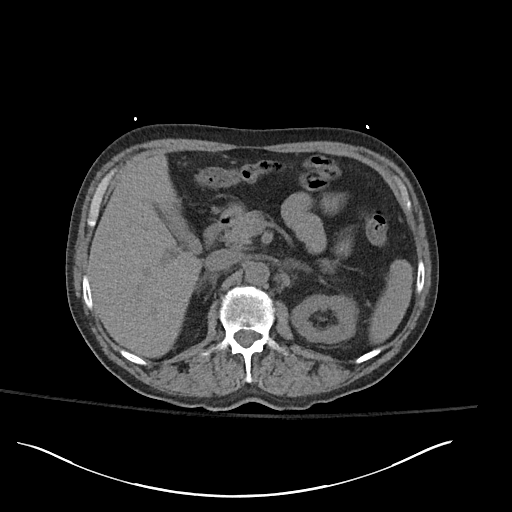
[im 78/93  soft-tissue]
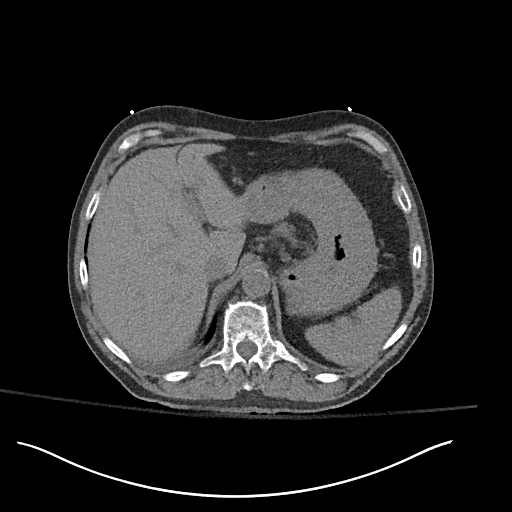
[im 78/93  bone]
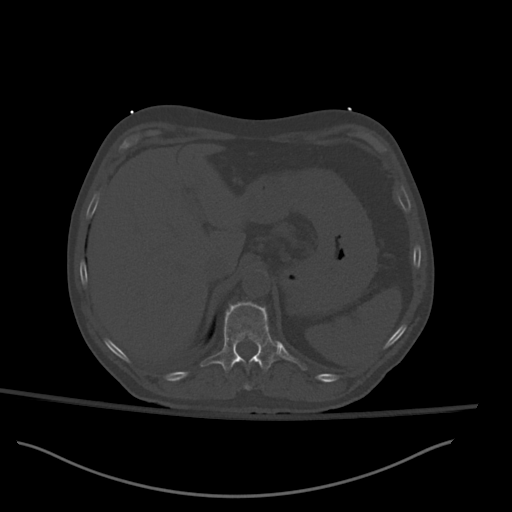
[im 85/93  soft-tissue]
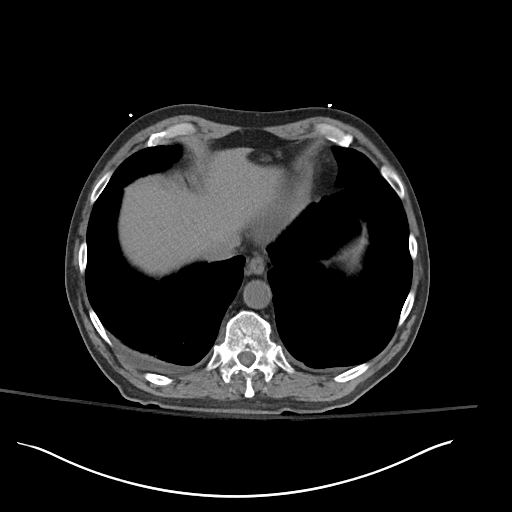

[Series 6: cor st · coronal · 0.68mm/px · 3 of 92 slices shown]
[im 31/92  soft-tissue]
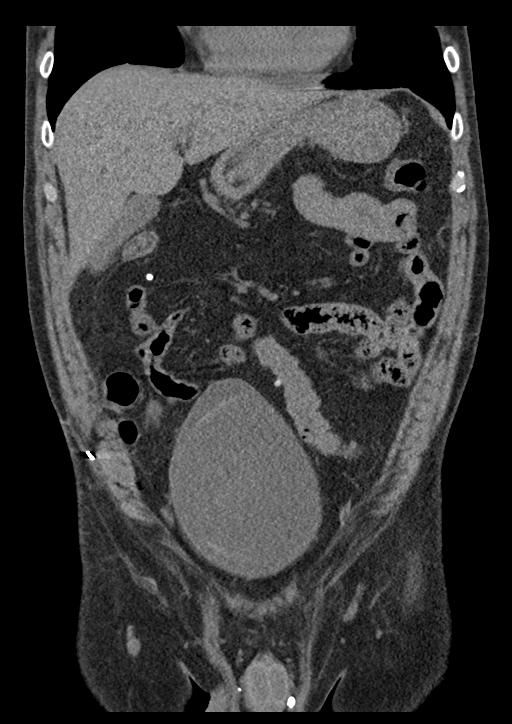
[im 41/92  soft-tissue]
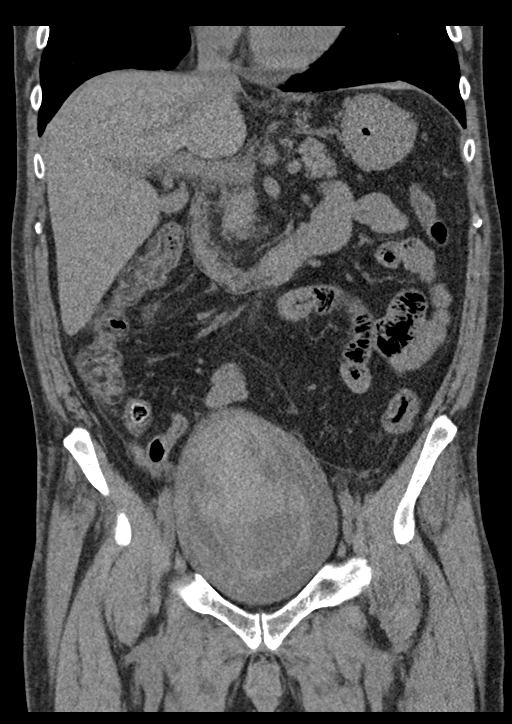
[im 51/92  soft-tissue]
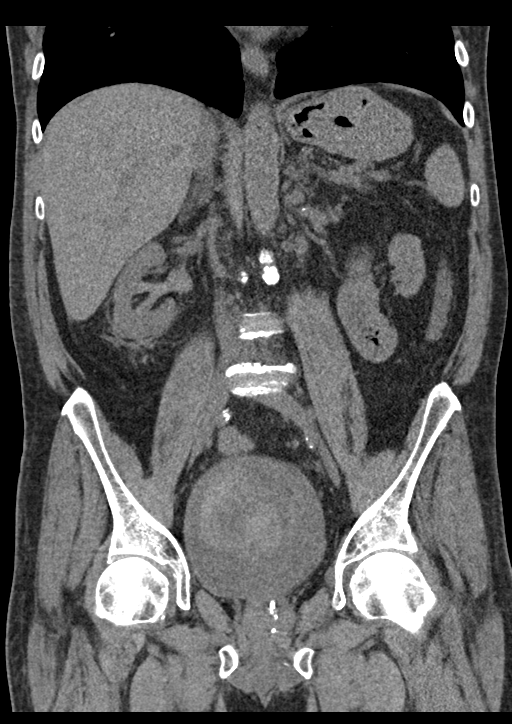

[13 of 46 positions shown; findings below may reference images not displayed]

FINDINGS: The lack of intravenous contrast limits the ability to evaluate
solid abdominal organs.

Lower chest: Limited visualization of the lower thorax demonstrates
interval development of trace bilateral effusion with worsening
bibasilar heterogeneous/consolidative opacities, right greater than
left. Previously identified nonspecific ground-glass opacities
within the imaged lung bases is not seen on the present examination
though there is mild residual intraseptal thickening.

Normal heart size. Trace amount of pericardial fluid, unchanged
presumably physiologic. There is diffuse decreased attenuation intra
cardiac blood pool suggestive of anemia.

Hepatobiliary: Normal hepatic contour. Apparent high density
material within the gallbladder could represent biliary sludge. No
definitive gallbladder wall thickening or pericholecystic stranding
on this noncontrast examination. No ascites.

Pancreas: Normal noncontrast appearance of the pancreas.

Spleen: Normal noncontrast appearance of the spleen.

Adrenals/Urinary Tract: There is a very tiny (approximately 2.7 x
1.6 x 1.2 cm) perinephric hematoma about the inferior pole of the
right kidney (axial image 44, series 3; coronal image 57, series 6),
with minimal amount of adjacent perinephric stranding.

High-density material is seen within the right renal collecting
system and ureter with moderate to large amount of layering
high-density material within urinary bladder, findings compatible
with hemorrhage into the collecting system and bladder. Mild
associated right-sided pelviectasis and ureterectasis.

Normal noncontrast appearance of the left kidney. No evidence of
left-sided nephrolithiasis or urinary obstruction.

Normal noncontrast appearance of the bilateral adrenal glands.

Stomach/Bowel: Scattered minimal colonic diverticulosis without
evidence of superimposed acute diverticulitis on this noncontrast
examination. Normal appearance of the terminal ileum. The appendix
is not visualized compatible with provided operative history. No
discrete areas of bowel wall thickening on this noncontrast
examination. No pneumoperitoneum, pneumatosis or portal venous gas.

Vascular/Lymphatic: Moderate amount of atherosclerotic plaque within
normal caliber abdominal aorta.

Scattered retroperitoneal lymph nodes are numerous though
individually not enlarged by size criteria with index left sided
periaortic lymph node measuring 0.7 cm in greatest short axis
diameter (image 31, series 3), presumably reactive in etiology. No
bulky retroperitoneal, mesenteric, pelvic or inguinal
lymphadenopathy on this noncontrast examination

Reproductive: Dystrophic calcifications within normal sized prostate
gland. Trace amount of fluid within the pelvic cul-de-sac.

Other: Small bilateral mesenteric fat containing inguinal hernias,
left greater than right. Minimal amount of subcutaneous edema about
the midline of the low back. Presumed shrapnel is seen within the
right lower abdominal/pelvic ventral abdominal wall.

Musculoskeletal: No acute or aggressive osseous abnormalities.
Mild-to-moderate multilevel lumbar spine DDD, worse at L4-L5 and
L5-S1 with disc space height loss, endplate irregularity and small
posteriorly directed disc osteophyte complexes at these locations.
Mild degenerative change the bilateral hips with joint space loss,
subchondral sclerosis and osteophytosis, right greater than left.
IMPRESSION: 1. Post right-sided renal biopsy complicated by tiny (approximately
2.7 cm) perinephric hematoma and bleeding into the right renal
collecting system including moderate to large-sized clot within the
urinary bladder and associated mild right-sided pelviectasis and
ureterectasis. Consideration for initiation of continuous bladder
irrigation could be performed as indicated.
2. Trace bilateral effusions with associated bibasilar opacities,
right greater than left, likely atelectasis.
3. Colonic diverticulosis without evidence superimposed acute
diverticulitis.
4.  Aortic Atherosclerosis ([M8]-[M8]).

Critical Value/emergent results were called by telephone at the time
of interpretation on [DATE] at [DATE] to provider ANPENPAN
, who verbally acknowledged these results.

## 2021-08-28 IMAGING — US US BIOPSY
1 series · 13 of 21 positions shown · non-contrast
Comparison: CT abdomen and pelvis-[DATE]

INDICATION: Acute kidney injury of uncertain etiology. Please perform image
guided biopsy for tissue diagnostic purposes.

EXAM:
ULTRASOUND GUIDED RENAL BIOPSY

[Series 1: us biopsy (kidney) · 13 of 21 slices shown]
[im 1/21]
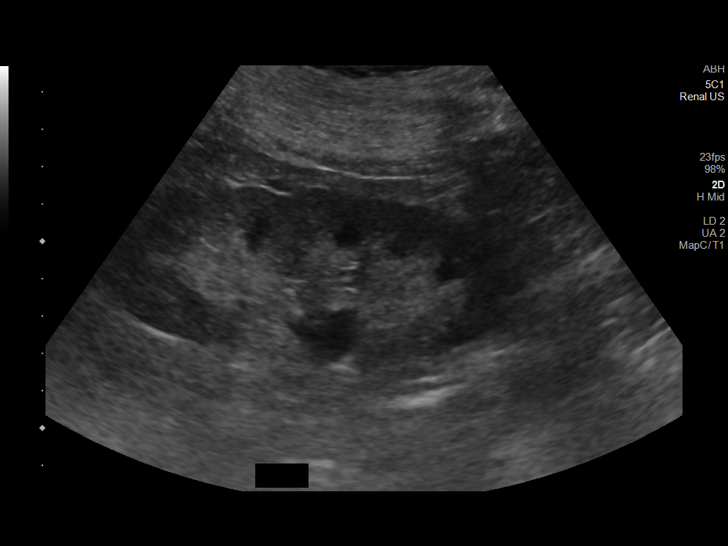
[im 3/21]
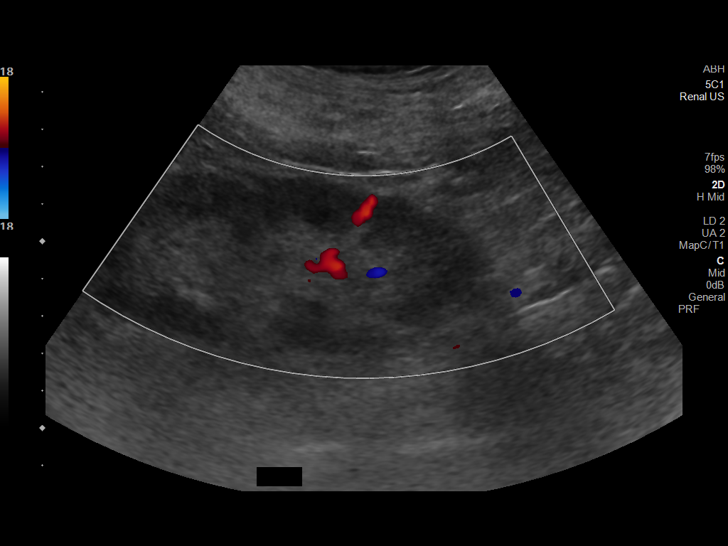
[im 5/21]
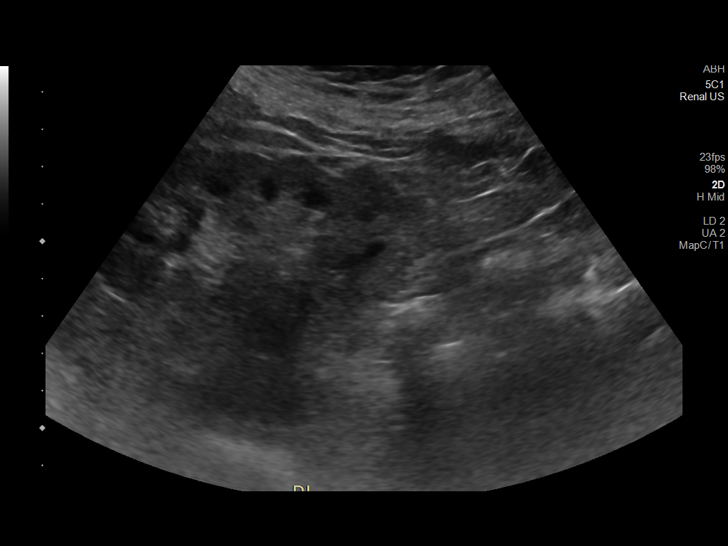
[im 6/21]
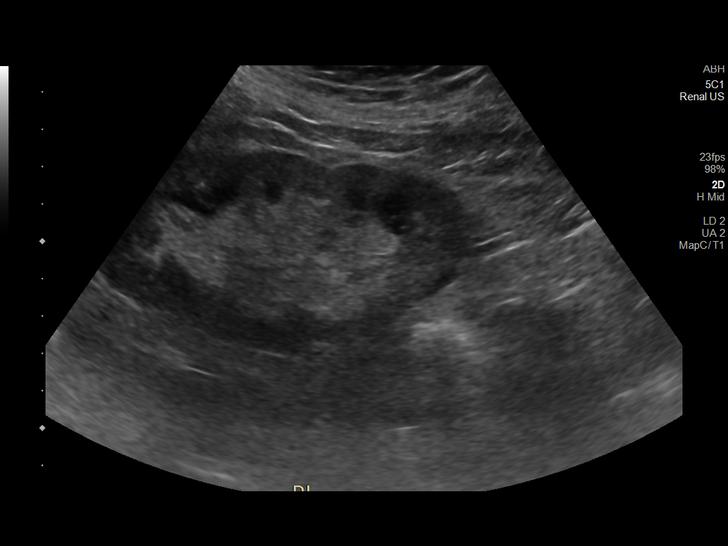
[im 8/21]
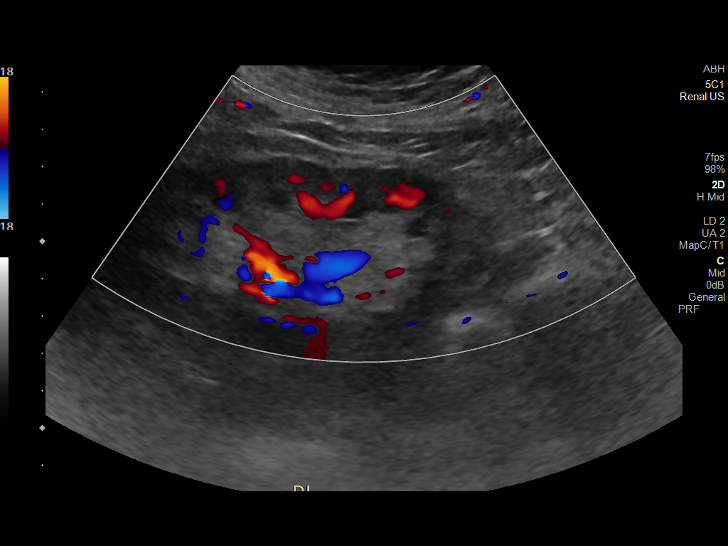
[im 9/21]
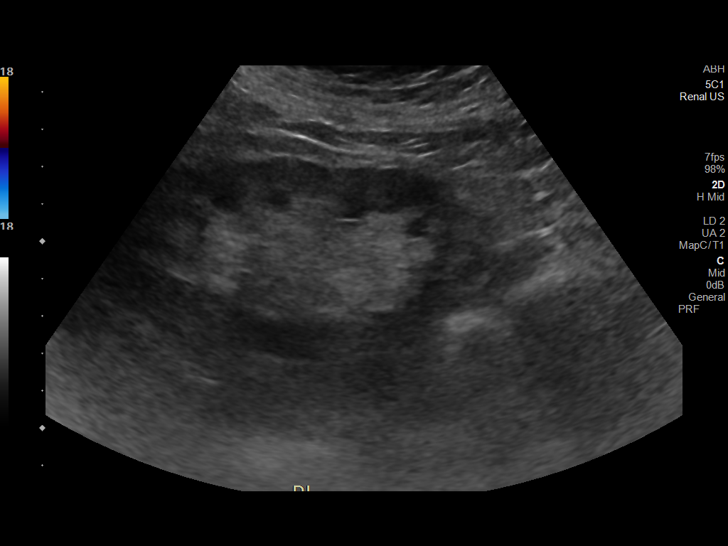
[im 11/21]
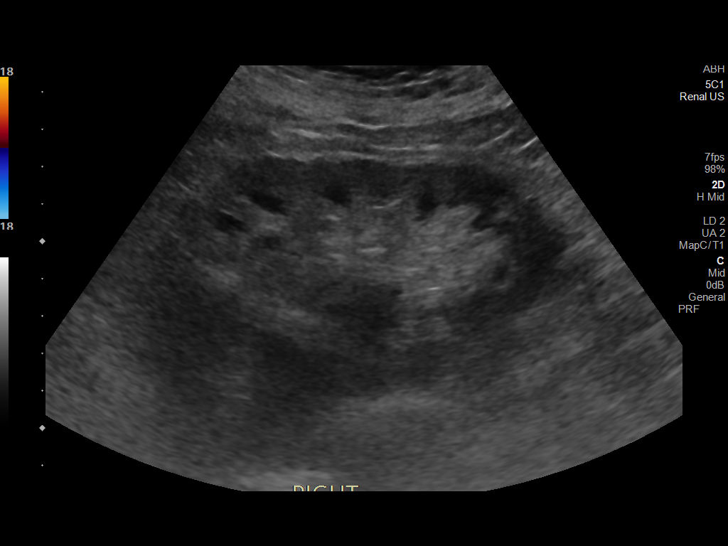
[im 13/21]
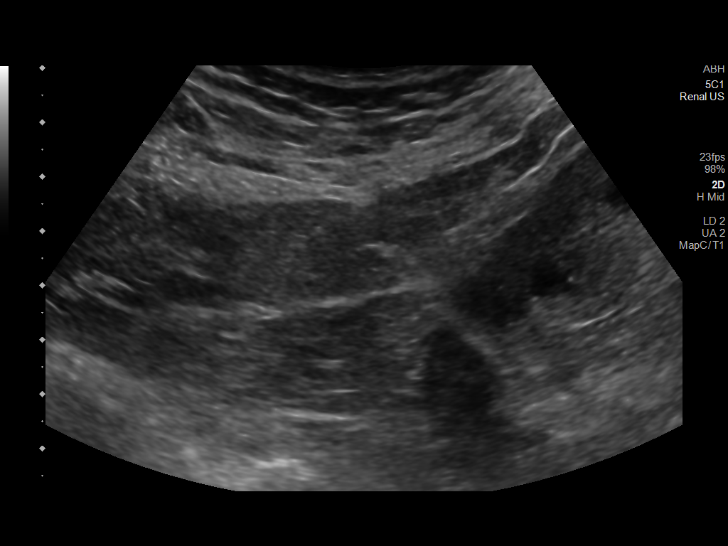
[im 14/21]
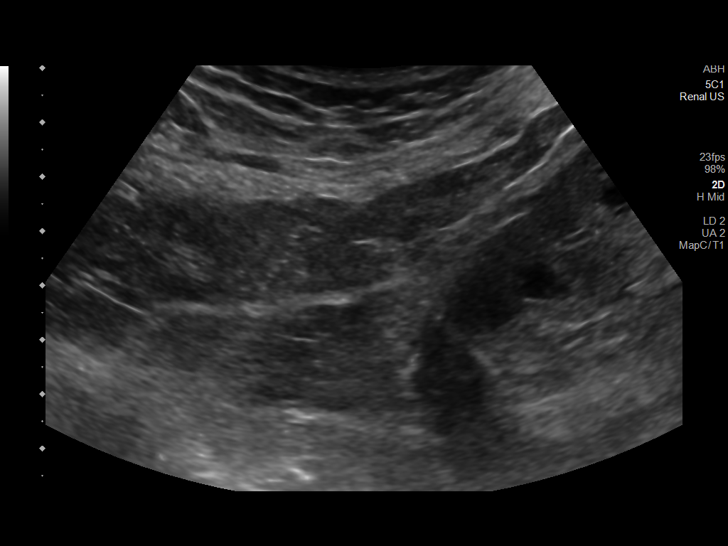
[im 16/21]
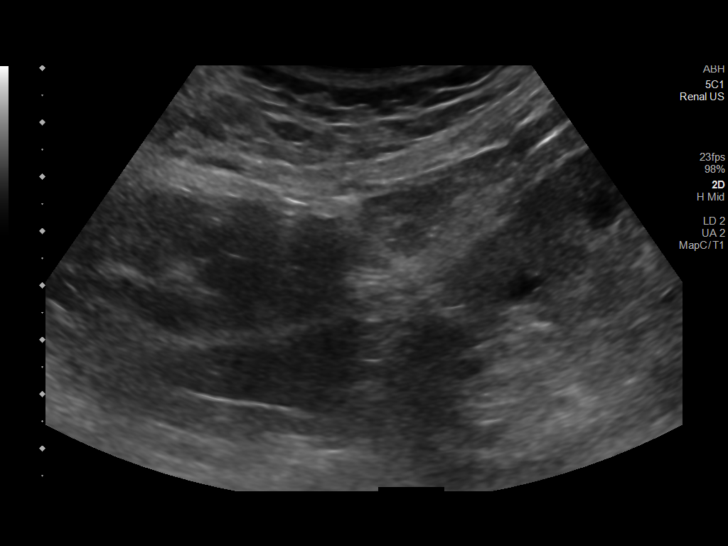
[im 17/21]
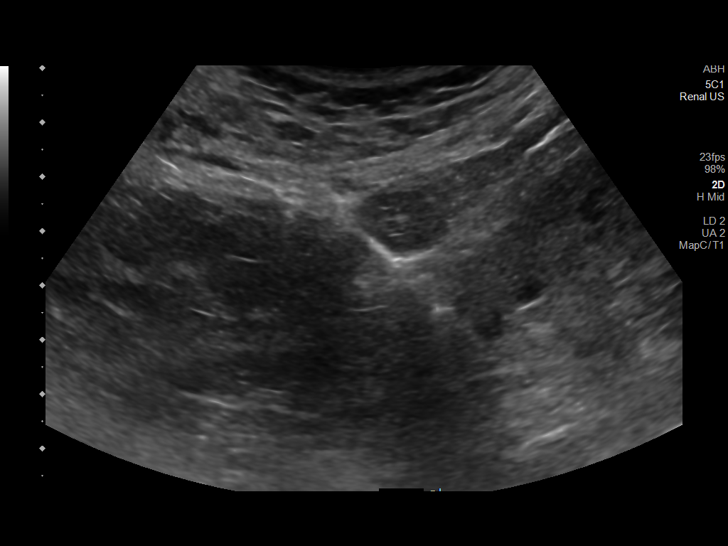
[im 19/21]
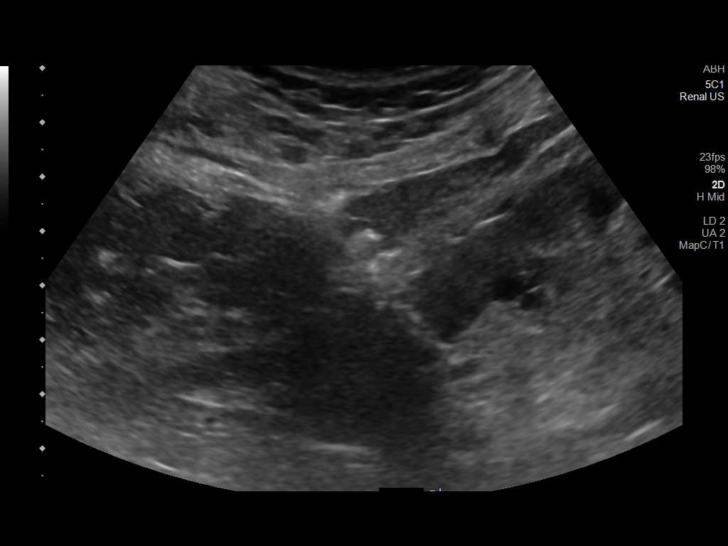
[im 21/21]
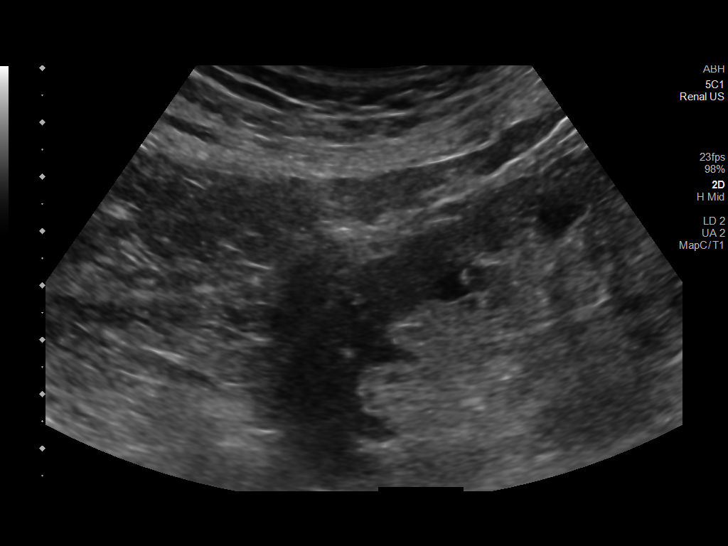

[13 of 21 positions shown; findings below may reference images not displayed]

MEDICATIONS:
None.

ANESTHESIA/SEDATION:
Fentanyl 100 mcg IV; Versed 2 mg IV

Total Moderate Sedation time: 14 minutes; The patient was
continuously monitored during the procedure by the interventional
radiology nurse under my direct supervision.

COMPLICATIONS:
None immediate.

PROCEDURE:
Informed written consent was obtained from the patient after a
discussion of the risks, benefits and alternatives to treatment. The
patient understands and consents the procedure. A timeout was
performed prior to the initiation of the procedure.

Ultrasound scanning was performed of the bilateral flanks. The
inferior pole of the right kidney was selected for biopsy due to
location and sonographic window. The procedure was planned. The
operative site was prepped and draped in the usual sterile fashion.
The overlying soft tissues were anesthetized with 1% lidocaine with
epinephrine. A 17 gauge core needle biopsy device was advanced into
the inferior cortex of the right kidney and 3 core biopsies were
obtained under direct ultrasound guidance. Images were saved for
documentation purposes. The biopsy device was removed and hemostasis
was obtained with manual compression. Post procedural scanning was
negative for significant post procedural hemorrhage or additional
complication. A dressing was placed. The patient tolerated the
procedure well without immediate post procedural complication.
IMPRESSION: Technically successful ultrasound guided right renal biopsy.

## 2021-08-28 MED ORDER — LIDOCAINE-EPINEPHRINE 1 %-1:100000 IJ SOLN
INTRAMUSCULAR | Status: AC
  Filled 2021-08-28: qty 1

## 2021-08-28 MED ORDER — HYDROMORPHONE HCL 1 MG/ML IJ SOLN
1.0000 mg | INTRAMUSCULAR | Status: AC | PRN
  Administered 2021-08-28 – 2021-08-29 (×4): 1 mg via INTRAVENOUS
  Filled 2021-08-28 (×4): qty 1

## 2021-08-28 MED ORDER — LACTATED RINGERS IV SOLN: INTRAVENOUS | Status: DC

## 2021-08-28 MED ORDER — GELATIN ABSORBABLE 12-7 MM EX MISC
CUTANEOUS | Status: AC
  Filled 2021-08-28: qty 1

## 2021-08-28 MED ORDER — LIDOCAINE HCL (PF) 1 % IJ SOLN
INTRAMUSCULAR | Status: AC
  Filled 2021-08-28: qty 30

## 2021-08-28 MED ORDER — FENTANYL CITRATE (PF) 100 MCG/2ML IJ SOLN
INTRAMUSCULAR | Status: AC
  Filled 2021-08-28: qty 2

## 2021-08-28 MED ORDER — BELLADONNA ALKALOIDS-OPIUM 16.2-60 MG RE SUPP
1.0000 | Freq: Three times a day (TID) | RECTAL | Status: AC
Start: 2021-08-28 — End: 2021-08-29
  Administered 2021-08-28 – 2021-08-29 (×3): 1 via RECTAL
  Filled 2021-08-28 (×3): qty 1

## 2021-08-28 MED ORDER — MIDAZOLAM HCL 2 MG/2ML IJ SOLN
INTRAMUSCULAR | Status: AC | PRN
  Administered 2021-08-28: 0.5 mg via INTRAVENOUS
  Administered 2021-08-28: 1 mg via INTRAVENOUS
  Administered 2021-08-28: 0.5 mg via INTRAVENOUS

## 2021-08-28 MED ORDER — SODIUM CHLORIDE 0.9 % IR SOLN
3000.0000 mL | Status: DC
  Administered 2021-08-28 – 2021-08-29 (×8): 3000 mL

## 2021-08-28 MED ORDER — MIDAZOLAM HCL 2 MG/2ML IJ SOLN
INTRAMUSCULAR | Status: AC
  Filled 2021-08-28: qty 2

## 2021-08-28 MED ORDER — LACTATED RINGERS IV BOLUS
1000.0000 mL | Freq: Once | INTRAVENOUS | Status: AC
  Administered 2021-08-28: 1000 mL via INTRAVENOUS

## 2021-08-28 MED ORDER — PHYTONADIONE 5 MG PO TABS
5.0000 mg | ORAL_TABLET | Freq: Once | ORAL | Status: AC
  Administered 2021-08-28: 5 mg via ORAL
  Filled 2021-08-28: qty 1

## 2021-08-28 MED ORDER — FENTANYL CITRATE (PF) 100 MCG/2ML IJ SOLN
INTRAMUSCULAR | Status: AC | PRN
  Administered 2021-08-28: 25 ug via INTRAVENOUS
  Administered 2021-08-28: 50 ug via INTRAVENOUS
  Administered 2021-08-28: 25 ug via INTRAVENOUS

## 2021-08-28 NOTE — Consult Note (Signed)
Urology Consult Note   Requesting Attending Physician:  Jonetta Osgood, MD Service Providing Consult: Urology  Consulting Attending: Dr. Louis Meckel   Reason for Consult:  Gross hematuria, urinary retention  HPI: Caleb Simmons is seen in consultation for reasons noted above at the request of Jonetta Osgood, MD for evaluation of urinary retention w/ possible upper urinary tract bleeding and clot retention.  This is a 70 y.o. male with Hx of nephrolithiasis who was admitted w/ AKI and severe normocytic anemia on 08/23/21. He underwent an US guided right renal biopsy today and has had progressive difficulty urinating. He had a presyncope episode w/ hypotension post-procedure, so further workup was initiated. CT A/P demonstrated a very distended bladder with likely a large volume of blood products present.  His Hgb has otherwise been stable throughout today.    Past Medical History: History reviewed. No pertinent past medical history.  Past Surgical History:  Past Surgical History:  Procedure Laterality Date   APPENDECTOMY      Medication: Current Facility-Administered Medications  Medication Dose Route Frequency Provider Last Rate Last Admin   0.9 %  sodium chloride infusion  10 mL/hr Intravenous Once Carmin Muskrat, MD       0.9 %  sodium chloride infusion   Intravenous Continuous Jonetta Osgood, MD 10 mL/hr at 08/27/21 2147 New Bag at 08/27/21 2147   acetaminophen (TYLENOL) tablet 650 mg  650 mg Oral Q6H PRN Jonetta Osgood, MD   650 mg at 08/27/21 2147   ALPRAZolam Duanne Moron) tablet 0.25 mg  0.25 mg Oral TID PRN Jonetta Osgood, MD   0.25 mg at 08/28/21 0005   bisacodyl (DULCOLAX) EC tablet 5 mg  5 mg Oral Daily PRN Caren Griffins, MD   5 mg at 08/25/21 3536   cyanocobalamin ((VITAMIN B-12)) injection 1,000 mcg  1,000 mcg Subcutaneous Q2000 Jonetta Osgood, MD   1,000 mcg at 08/27/21 2136   fentaNYL (SUBLIMAZE) 100 MCG/2ML injection            folic  acid (FOLVITE) tablet 2 mg  2 mg Oral Daily Jonetta Osgood, MD   2 mg at 08/28/21 1443   gelatin adsorbable (GELFOAM/SURGIFOAM) 12-7 MM sponge 12-7 mm            lactated ringers infusion   Intravenous Continuous Jonetta Osgood, MD 150 mL/hr at 08/28/21 1831 New Bag at 08/28/21 1831   lidocaine-EPINEPHrine (XYLOCAINE W/EPI) 1 %-1:100000 (with pres) injection            melatonin tablet 5 mg  5 mg Oral QHS Ghimire, Shanker M, MD   5 mg at 08/27/21 2135   midazolam (VERSED) 2 MG/2ML injection            nicotine (NICODERM CQ - dosed in mg/24 hours) patch 14 mg  14 mg Transdermal Q24H Jonetta Osgood, MD   14 mg at 08/28/21 0926   ondansetron (ZOFRAN) tablet 4 mg  4 mg Oral Q6H PRN Caren Griffins, MD       Or   ondansetron (ZOFRAN) injection 4 mg  4 mg Intravenous Q6H PRN Caren Griffins, MD       pantoprazole (PROTONIX) EC tablet 40 mg  40 mg Oral BID AC Caren Griffins, MD   40 mg at 08/28/21 1830   phytonadione (VITAMIN K) tablet 5 mg  5 mg Oral Once Jonetta Osgood, MD       promethazine (PHENERGAN) 6.25 MG/5ML  syrup 6.25 mg  6.25 mg Oral Q4H PRN Caren Griffins, MD       sodium bicarbonate tablet 1,300 mg  1,300 mg Oral TID Gean Quint, MD   1,300 mg at 08/28/21 1830   sodium chloride irrigation 0.9 % 3,000 mL  3,000 mL Irrigation Continuous Ghimire, Henreitta Leber, MD       tamsulosin [facility]) capsule 0.4 mg  0.4 mg Oral Daily Jonetta Osgood, MD   0.4 mg at 08/28/21 4193   technetium albumin aggregated (MAA) injection solution 4.2 millicurie  4.2 millicurie Intravenous Once PRN Felipa Emory, MD       traZODone (DESYREL) tablet 50 mg  50 mg Oral QHS PRN Jonetta Osgood, MD   50 mg at 08/27/21 2136    Allergies: No Known Allergies  Social History: Social History   Tobacco Use   Smoking status: Some Days    Packs/day: 0.25    Years: 25.00    Pack years: 6.25    Types: Cigarettes   Smokeless tobacco: Never    Family History History reviewed. No  pertinent family history.  Review of Systems 10 systems were reviewed and are negative except as noted specifically in the HPI.  Objective   Vital signs in last 24 hours: BP 103/73 (BP Location: Right Arm)   Pulse (!) 101   Temp (!) 97.5 F (36.4 C) (Oral)   Resp 18   Ht 5\' 10"  (1.778 m)   Wt 81.6 kg   SpO2 100%   BMI 25.83 kg/m   Physical Exam General: NAD, A&O, resting, appropriate HEENT: /AT, EOMI, MMM Pulmonary: Normal work of breathing Cardiovascular: HDS, adequate peripheral perfusion Abdomen: Soft, NTTP, nondistended,. GU: Hematuria catheter in place draining light pink tinged urine on a moderate drip CBI, mild right CVA tenderness Extremities: warm and well perfused Neuro: Appropriate, no focal neurological deficits  Most Recent Labs: Lab Results  Component Value Date   WBC 12.3 (H) 08/28/2021   HGB 8.5 (L) 08/28/2021   HCT 26.3 (L) 08/28/2021   PLT 265 08/28/2021    Lab Results  Component Value Date   NA 138 08/28/2021   K 3.6 08/28/2021   CL 109 08/28/2021   CO2 21 (L) 08/28/2021   BUN 33 (H) 08/28/2021   CREATININE 5.03 (H) 08/28/2021   CALCIUM 8.3 (L) 08/28/2021   MG 2.1 08/25/2021   PHOS 4.7 (H) 08/28/2021    Lab Results  Component Value Date   INR 1.5 (H) 08/28/2021     IMAGING: US BIOPSY (KIDNEY)  Result Date: 08/28/2021 INDICATION: Acute kidney injury of uncertain etiology. Please perform image guided biopsy for tissue diagnostic purposes. EXAM: ULTRASOUND GUIDED RENAL BIOPSY COMPARISON:  CT abdomen and pelvis-08/23/2021 MEDICATIONS: None. ANESTHESIA/SEDATION: Fentanyl 100 mcg IV; Versed 2 mg IV Total Moderate Sedation time: 14 minutes; The patient was continuously monitored during the procedure by the interventional radiology nurse under my direct supervision. COMPLICATIONS: None immediate. PROCEDURE: Informed written consent was obtained from the patient after a discussion of the risks, benefits and alternatives to treatment. The patient  understands and consents the procedure. A timeout was performed prior to the initiation of the procedure. Ultrasound scanning was performed of the bilateral flanks. The inferior pole of the right kidney was selected for biopsy due to location and sonographic window. The procedure was planned. The operative site was prepped and draped in the usual sterile fashion. The overlying soft tissues were anesthetized with 1% lidocaine with epinephrine. A 17 gauge core  needle biopsy device was advanced into the inferior cortex of the right kidney and 3 core biopsies were obtained under direct ultrasound guidance. Images were saved for documentation purposes. The biopsy device was removed and hemostasis was obtained with manual compression. Post procedural scanning was negative for significant post procedural hemorrhage or additional complication. A dressing was placed. The patient tolerated the procedure well without immediate post procedural complication. IMPRESSION: Technically successful ultrasound guided right renal biopsy. Electronically Signed   By: Sandi Mariscal M.D.   On: 08/28/2021 12:52    ------  Assessment:  70 y.o. male with Hx of nephrolithiasis who was admitted w/ AKI and severe normocytic anemia on 08/23/21. W/ concern for clot retention and ongoing upper tract bleeding following a US guided right renal biopsy today  We placed a 52F Hematuria Catheter w/ 10 cc's in the balloon. WE had return of 200 mL's of cloudy red urine. Hand irrigation was perfromed w/ difficulty related to patient sensitivity. We were able to get ~ 20 cc's of dark clot; however, none more.Bladder scan only demonstrated 11 mL's. We initiated CBI at a moderate drip and his urine cleared to a light pink.  Recommendations: - Continue Foley catheter to drainage - Continue CBI, titrating to light pink - Continue to trend H/H - Will intiate B&O suppositories for bladder spasms. - Urology will continue to follow   Thank you for this  consult. Please contact the urology consult pager with any further questions/concerns.  Reola Mosher, MD [facility] Urology Resident, [facility]

## 2021-08-28 NOTE — Progress Notes (Signed)
[facility] NEPHROLOGY PROGRESS NOTE  Assessment/ Plan:  #Acute kidney injury, nonoliguric, with history of excessive NSAID's use/BC powders every day.  Peaked creatinine level of 6.95.  CT scan without obstruction or hydronephrosis.  UA with more than 300 protein and microscopic hematuria.  Serology evaluation including ANA, ANCA, C3, C4, SPEP, anti-GBM, double-stranded DNA, hep B, hep C, HIV negative.  IR planning for kidney biopsy today.  The creatinine level is stable with good urine output.  No uremic symptoms. Recommend strict ins and out, daily lab.  #Anemia: FOBT negative, transfuse blood as needed.  Treated with IV iron.  #History of nephrolithiasis: Repeat CT scan without any obstruction or hydronephrosis.  #Metabolic acidosis: Serum CO2 level improving with oral sodium bicarbonate.  Subjective: Seen and examined.  Urine output of 2.7 L in last 24 hours.  Denies nausea, vomiting, chest pain, shortness of breath.  Anxious about kidney biopsy procedure today.  No family member at bedside.  Objective Vital signs in last 24 hours: Vitals:   08/27/21 2000 08/28/21 0000 08/28/21 0441 08/28/21 0733  BP: 139/81 124/76 105/63 132/82  Pulse: 84 69 67 71  Resp: 17 14 20 16   Temp: 99 F (37.2 C) 97.9 F (36.6 C) 97.9 F (36.6 C) 98.1 F (36.7 C)  TempSrc: Oral Oral Oral Oral  SpO2: 94% 92% 96% 94%  Weight:      Height:       Weight change:   Intake/Output Summary (Last 24 hours) at 08/28/2021 1015 Last data filed at 08/28/2021 0000 Gross per 24 hour  Intake --  Output 1925 ml  Net -1925 ml        Labs: Basic Metabolic Panel: Recent Labs  Lab 08/26/21 0006 08/27/21 0029 08/28/21 0305  NA 137 139 138  K 4.0 3.8 3.6  CL 111 111 109  CO2 19* 21* 21*  GLUCOSE 106* 92 93  BUN 40* 35* 33*  CREATININE 5.52* 5.09* 5.03*  CALCIUM 7.8* 8.2* 8.3*  PHOS 4.3 4.3 4.7*    Liver Function Tests: Recent Labs  Lab 08/23/21 1037 08/24/21 0055 08/25/21 0758  08/26/21 1036 08/27/21 0029 08/28/21 0305  AST 17 14*  --  14*  --   --   ALT 10 10  --  9  --   --   ALKPHOS 66 57  --  56  --   --   BILITOT 0.3 0.4  --  0.8  --   --   PROT 5.6* 4.8*  --  5.0*  --   --   ALBUMIN 2.2* 1.9*   < > 1.9* 1.8* 1.8*   < > = values in this interval not displayed.    Recent Labs  Lab 08/23/21 1037  LIPASE 70*    No results for input(s): AMMONIA in the last 168 hours. CBC: Recent Labs  Lab 08/25/21 0758 08/26/21 0006 08/26/21 1036 08/27/21 0029 08/28/21 0305  WBC 9.5 10.7* 12.1* 10.7* 9.3  NEUTROABS  --   --   --   --  5.8  HGB 7.8* 6.8* 8.8* 8.3* 8.4*  HCT 24.9* 21.3* 27.4* 26.4* 26.8*  MCV 81.4 81.3 81.5 82.5 82.2  PLT 282 261 253 280 263    Cardiac Enzymes: No results for input(s): CKTOTAL, CKMB, CKMBINDEX, TROPONINI in the last 168 hours. CBG: No results for input(s): GLUCAP in the last 168 hours.  Iron Studies: No results for input(s): IRON, TIBC, TRANSFERRIN, FERRITIN in the last 72 hours. Studies/Results: No results found.  Medications: Infusions:  sodium chloride     sodium chloride 10 mL/hr at 08/27/21 2147    Scheduled Medications:  cyanocobalamin  1,000 mcg Subcutaneous F1102   folic acid  2 mg Oral Daily   melatonin  5 mg Oral QHS   nicotine  14 mg Transdermal Q24H   pantoprazole  40 mg Oral BID AC   sodium bicarbonate  1,300 mg Oral TID   tamsulosin  0.4 mg Oral Daily    have reviewed scheduled and prn medications.  Physical Exam: General: Not in distress, able to lie on bed Heart:RRR, s1s2 nl Lungs: Clear b/l, no crackle Abdomen:soft, Non-tender, non-distended Extremities:No LE edema Neurology: Alert, awake, following commands  Caleb Simmons Tanna Furry 08/28/2021,10:15 AM  LOS: 5 days

## 2021-08-28 NOTE — Procedures (Signed)
Pre Procedure Dx: AKI Post Procedural Dx: Same  Technically successful US guided biopsy of inferior pole of the right kidney.  EBL: None  No immediate complications.   Ronny Bacon, MD Pager #: [phone]

## 2021-08-28 NOTE — Progress Notes (Signed)
CT Abd d/w Dr Larene Beach in to the renal collecting system-recommends-3 way foley with CBI-which was ordered. Once patient got back to the floor he was having difficulty urinating and started to develop lower abdomen pain.   RN placed a 3 way foley-however no urine obtained-bladder distended on exam. Spoke with Urologist-Dr Herrick-who advised removal of foley and to get a cystoscopy cart from the OR. Patient able to pas a few dribble's of urine after catheter was removed.   Urology to evaluate shortly and provide further recommendations  Patient/family updated regarding above.

## 2021-08-28 NOTE — Progress Notes (Addendum)
PROGRESS NOTE        PATIENT DETAILS Name: Caleb Simmons Age: 70 y.o. Sex: male Date of Birth: 1951/12/09 Admit Date: 08/23/2021 Admitting Physician Costin Karlyne Greenspan, MD PCP:Pcp, No  Brief Narrative: Patient is a 70 y.o. male with history of nephrolithiasis-presenting with generalized weakness/shortness of breath-found to have AKI and severe normocytic anemia.  Admitted to the hospitalist service for further evaluation and treatment.  Significant events: 9/1>> admit for evaluation of AKI/severe anemia. 9/6>> ultrasound-guided biopsy of right kidney 9/6>> hematuria-presyncope-hypotensive-IV fluid bolus started.  Significant studies: 9/1>> CXR: No pneumonia 9/1>> VQ scan: No PE 9/1>> CT renal stone study: No hydronephrosis, distended bladder.  Diverticulosis. 9/1>> FOBT: Negative 9/1>> UA: Protein>> 300, RBCs 11-20/hpf 9/1>> HIV: Nonreactive 9/1>> HBsAg/HCV Ab: Nonreactive 9/1>> C3/C4: Normal limits 9/1>> glomerular basement Ab: Negative 9/1>> ANA: Negative 9/1>> dsDNA: Negative 9/1>> ANCA titers: Negative 9/1>> SPEP: Pending 9/2>> Echo: EF 87-86%, grade 1 diastolic dysfunction. 9/4>>Folic acid :7.6([facility]) 9/4>>BSJ [id number]   Antimicrobial therapy: None  Microbiology data: 9/1>> COVID PCR: Negative  Procedures : 9/06>> ultrasound-guided biopsy of right kidney  Consults: Nephrology, IR  DVT Prophylaxis : Place and maintain sequential compression device Start: 08/24/21 0519   Subjective: Underwent kidney biopsy earlier-now week-had a presyncopal event when he went to the bathroom.  Blood pressure in the 29U systolic.  Assessment/Plan: Acute kidney injury: Renal function slowly improving but seems to have plateaued-concern for glomerular pathology given significant proteinuria and RBCs in urine.  Extensive serological work-up negative so far.  Foley catheter removed on 9/-voiding well.  Nephrology following-underwent renal biopsy on 9/6.     Intake/Output Summary (Last 24 hours) at 08/28/2021 1613 Last data filed at 08/28/2021 1100 Gross per 24 hour  Intake --  Output 1450 ml  Net -1450 ml     Hypotension/presyncope-hematuria-s/p renal biopsy on 9/6: Concerned that he may be bleeding from renal biopsy site-starting IV fluid boluses-check stat CBC.  Have contacted IR PA-C-we will get in touch with IR MD in providers further recommendations.  We will watch closely-if BP not responsive to volume expansion-May need to be started on pressors.  Watch closely.  Addendum: Received call back from radiology-CT abdomen/pelvis without contrast recommended.  Have ordered stat.  Microcytic anemia: Hemoglobin stabilizing-appears to have multifactorial anemia from AKI/acute illness/iron/vitamin B12 and folate deficiency.  Has required a total of 3 units of PRBC so far.  Continue supportive care-continue to replete iron/folate and B12.   Minimally elevated troponins: Trend is flat-not consistent with ACS-he does not have any chest pain.  Exertional dyspnea was from symptomatic anemia.  Suspect elevated troponin is in the setting of kidney failure.  Echo without any wall motion malady and preserved EF.  Doubt any further work-up is required-especially in light of severity of anemia and AKI.  Anxiety: Continue Xanax while he is in the hospital-is aware that we will likely not continue Xanax post discharge.  Use trazodone for sleep.  Patient is not interested in starting long-acting medications like SSRI for his anxiety-thinks that once he gets home-he will not require any further medications for his anxiety issues.  Tobacco abuse: Continue transdermal nicotine. Diet: Diet Order             Diet regular Room service appropriate? Yes; Fluid consistency: Thin  Diet effective now  Code Status: Full code   Family Communication: Daughter-in-law at bedside  Disposition Plan: Status is: Inpatient  Remains inpatient  appropriate because:Inpatient level of care appropriate due to severity of illness  Dispo: The patient is from: Home              Anticipated d/c is to: Home              Patient currently is not medically stable to d/c.   Difficult to place patient No     Barriers to Discharge: Severe AKI-severe anemia requiring PRBC transfusion-extensive serological work-up pending-renal biopsy ordered.  Clearly not stable for discharge at this point.  Antimicrobial agents: Anti-infectives (From admission, onward)    None        Time spent: 35 minutes-Greater than 50% of this time was spent in counseling, explanation of diagnosis, planning of further management, and coordination of care.  MEDICATIONS: Scheduled Meds:  cyanocobalamin  1,000 mcg Subcutaneous Q2000   fentaNYL       folic acid  2 mg Oral Daily   gelatin adsorbable       lidocaine-EPINEPHrine       melatonin  5 mg Oral QHS   midazolam       nicotine  14 mg Transdermal Q24H   pantoprazole  40 mg Oral BID AC   sodium bicarbonate  1,300 mg Oral TID   tamsulosin  0.4 mg Oral Daily   Continuous Infusions:  sodium chloride     sodium chloride 10 mL/hr at 08/27/21 2147   lactated ringers     PRN Meds:.acetaminophen, ALPRAZolam, bisacodyl, ondansetron **OR** ondansetron (ZOFRAN) IV, promethazine, technetium albumin aggregated, traZODone   PHYSICAL EXAM: Vital signs: Vitals:   08/28/21 1210 08/28/21 1215 08/28/21 1221 08/28/21 1500  BP: (!) 150/87 140/86 137/77 103/73  Pulse: 84 85 90 (!) 101  Resp: 14 19 14 18   Temp:    (!) 97.5 F (36.4 C)  TempSrc:    Oral  SpO2: 100% 100% 100%   Weight:      Height:       Filed Weights   08/23/21 0816  Weight: 81.6 kg   Body mass index is 25.83 kg/m.   Gen Exam:Alert awake-looks weak. HEENT:atraumatic, normocephalic Chest: B/L clear to auscultation anteriorly CVS:S1S2 regular Abdomen:soft non tender, non distended Extremities:no edema Neurology: Non focal Skin: no  rash   I have personally reviewed following labs and imaging studies  LABORATORY DATA: CBC: Recent Labs  Lab 08/25/21 0758 08/26/21 0006 08/26/21 1036 08/27/21 0029 08/28/21 0305  WBC 9.5 10.7* 12.1* 10.7* 9.3  NEUTROABS  --   --   --   --  5.8  HGB 7.8* 6.8* 8.8* 8.3* 8.4*  HCT 24.9* 21.3* 27.4* 26.4* 26.8*  MCV 81.4 81.3 81.5 82.5 82.2  PLT 282 261 253 280 263     Basic Metabolic Panel: Recent Labs  Lab 08/23/21 1037 08/24/21 0055 08/25/21 0037 08/25/21 0758 08/26/21 0006 08/27/21 0029 08/28/21 0305  NA  --  137  --  141 137 139 138  K  --  4.5  --  4.2 4.0 3.8 3.6  CL  --  110  --  113* 111 111 109  CO2  --  16*  --  20* 19* 21* 21*  GLUCOSE  --  93  --  84 106* 92 93  BUN  --  41*  --  39* 40* 35* 33*  CREATININE  --  6.12*  --  5.71* 5.52* 5.09*  5.03*  CALCIUM  --  7.7*  --  8.4* 7.8* 8.2* 8.3*  MG 1.6* 1.5* 2.1  --   --   --   --   PHOS  --  5.6*  --  5.6* 4.3 4.3 4.7*     GFR: Estimated Creatinine Clearance: 13.9 mL/min (A) (by C-G formula based on SCr of 5.03 mg/dL (H)).  Liver Function Tests: Recent Labs  Lab 08/23/21 1037 08/24/21 0055 08/25/21 0758 08/26/21 0006 08/26/21 1036 08/27/21 0029 08/28/21 0305  AST 17 14*  --   --  14*  --   --   ALT 10 10  --   --  9  --   --   ALKPHOS 66 57  --   --  56  --   --   BILITOT 0.3 0.4  --   --  0.8  --   --   PROT 5.6* 4.8*  --   --  5.0*  --   --   ALBUMIN 2.2* 1.9* 1.9* 1.7* 1.9* 1.8* 1.8*    Recent Labs  Lab 08/23/21 1037  LIPASE 70*    No results for input(s): AMMONIA in the last 168 hours.  Coagulation Profile: Recent Labs  Lab 08/24/21 1128 08/28/21 0305  INR 1.6* 1.5*     Cardiac Enzymes: No results for input(s): CKTOTAL, CKMB, CKMBINDEX, TROPONINI in the last 168 hours.  BNP (last 3 results) No results for input(s): PROBNP in the last 8760 hours.  Lipid Profile: No results for input(s): CHOL, HDL, LDLCALC, TRIG, CHOLHDL, LDLDIRECT in the last 72 hours.  Thyroid  Function Tests: No results for input(s): TSH, T4TOTAL, FREET4, T3FREE, THYROIDAB in the last 72 hours.  Anemia Panel: Recent Labs    08/26/21 1036  VITAMINB12 298  FOLATE 5.5*  RETICCTPCT 1.5     Urine analysis:    Component Value Date/Time   COLORURINE STRAW (A) 08/23/2021 1140   APPEARANCEUR CLEAR 08/23/2021 1140   LABSPEC 1.008 08/23/2021 1140   PHURINE 6.0 08/23/2021 1140   GLUCOSEU 50 (A) 08/23/2021 1140   HGBUR SMALL (A) 08/23/2021 1140   BILIRUBINUR NEGATIVE 08/23/2021 1140   KETONESUR NEGATIVE 08/23/2021 1140   PROTEINUR >=300 (A) 08/23/2021 1140   UROBILINOGEN 0.2 06/26/2007 1023   NITRITE NEGATIVE 08/23/2021 1140   LEUKOCYTESUR NEGATIVE 08/23/2021 1140    Sepsis Labs: Lactic Acid, Venous No results found for: LATICACIDVEN  MICROBIOLOGY: Recent Results (from the past 240 hour(s))  SARS CORONAVIRUS 2 (TAT 6-24 HRS) Nasopharyngeal Nasopharyngeal Swab     Status: None   Collection Time: 08/23/21  5:11 PM   Specimen: Nasopharyngeal Swab  Result Value Ref Range Status   SARS Coronavirus 2 NEGATIVE NEGATIVE Final    Comment: (NOTE) SARS-CoV-2 target nucleic acids are NOT DETECTED.  The SARS-CoV-2 RNA is generally detectable in upper and lower respiratory specimens during the acute phase of infection. Negative results do not preclude SARS-CoV-2 infection, do not rule out co-infections with other pathogens, and should not be used as the sole basis for treatment or other patient management decisions. Negative results must be combined with clinical observations, patient history, and epidemiological information. The expected result is Negative.  Fact Sheet for Patients: SugarRoll.be  Fact Sheet for Healthcare Providers: https://www.woods-mathews.com/  This test is not yet approved or cleared by the [place] FDA and  has been authorized for detection and/or diagnosis of SARS-CoV-2 by FDA under an Emergency Use  Authorization (EUA). This EUA will remain  in effect (meaning this  test can be used) for the duration of the COVID-19 declaration under Se ction 564(b)(1) of the Act, 21 U.S.C. section 360bbb-3(b)(1), unless the authorization is terminated or revoked sooner.  Performed at [facility], [place] [address], [place], [place] [address]     RADIOLOGY STUDIES/RESULTS: US BIOPSY (KIDNEY)  Result Date: 08/28/2021 INDICATION: Acute kidney injury of uncertain etiology. Please perform image guided biopsy for tissue diagnostic purposes. EXAM: ULTRASOUND GUIDED RENAL BIOPSY COMPARISON:  CT abdomen and pelvis-08/23/2021 MEDICATIONS: None. ANESTHESIA/SEDATION: Fentanyl 100 mcg IV; Versed 2 mg IV Total Moderate Sedation time: 14 minutes; The patient was continuously monitored during the procedure by the interventional radiology nurse under my direct supervision. COMPLICATIONS: None immediate. PROCEDURE: Informed written consent was obtained from the patient after a discussion of the risks, benefits and alternatives to treatment. The patient understands and consents the procedure. A timeout was performed prior to the initiation of the procedure. Ultrasound scanning was performed of the bilateral flanks. The inferior pole of the right kidney was selected for biopsy due to location and sonographic window. The procedure was planned. The operative site was prepped and draped in the usual sterile fashion. The overlying soft tissues were anesthetized with 1% lidocaine with epinephrine. A 17 gauge core needle biopsy device was advanced into the inferior cortex of the right kidney and 3 core biopsies were obtained under direct ultrasound guidance. Images were saved for documentation purposes. The biopsy device was removed and hemostasis was obtained with manual compression. Post procedural scanning was negative for significant post procedural hemorrhage or additional complication. A dressing was placed. The patient tolerated  the procedure well without immediate post procedural complication. IMPRESSION: Technically successful ultrasound guided right renal biopsy. Electronically Signed   By: Sandi Mariscal M.D.   On: 08/28/2021 12:52     LOS: 5 days   Oren Binet, MD  [facility]    To contact the attending provider between 7A-7P or the covering provider during after hours 7P-7A, please log into the web site [email or web address] and access using universal [place] password for that web site. If you do not have the password, please call the hospital operator.  08/28/2021, 4:13 PM

## 2021-08-29 DIAGNOSIS — R778 Other specified abnormalities of plasma proteins: Secondary | ICD-10-CM | POA: Diagnosis not present

## 2021-08-29 DIAGNOSIS — N179 Acute kidney failure, unspecified: Secondary | ICD-10-CM | POA: Diagnosis not present

## 2021-08-29 DIAGNOSIS — D649 Anemia, unspecified: Secondary | ICD-10-CM | POA: Diagnosis not present

## 2021-08-29 LAB — RENAL FUNCTION PANEL
Albumin: 1.6 g/dL — ABNORMAL LOW (ref 3.5–5.0)
Anion gap: 8 (ref 5–15)
BUN: 34 mg/dL — ABNORMAL HIGH (ref 8–23)
CO2: 20 mmol/L — ABNORMAL LOW (ref 22–32)
Calcium: 8.1 mg/dL — ABNORMAL LOW (ref 8.9–10.3)
Chloride: 109 mmol/L (ref 98–111)
Creatinine, Ser: 5.06 mg/dL — ABNORMAL HIGH (ref 0.61–1.24)
GFR, Estimated: 11 mL/min — ABNORMAL LOW (ref >60)
Glucose, Bld: 113 mg/dL — ABNORMAL HIGH (ref 70–99)
Phosphorus: 6.5 mg/dL — ABNORMAL HIGH (ref 2.5–4.6)
Potassium: 4.1 mmol/L (ref 3.5–5.1)
Sodium: 137 mmol/L (ref 135–145)

## 2021-08-29 LAB — CBC
HCT: 22.4 % — ABNORMAL LOW (ref 39.0–52.0)
HCT: 24.1 % — ABNORMAL LOW (ref 39.0–52.0)
Hemoglobin: 7.1 g/dL — ABNORMAL LOW (ref 13.0–17.0)
Hemoglobin: 7.4 g/dL — ABNORMAL LOW (ref 13.0–17.0)
MCH: 26.5 pg (ref 26.0–34.0)
MCH: 26 pg (ref 26.0–34.0)
MCHC: 31.7 g/dL (ref 30.0–36.0)
MCHC: 30.7 g/dL (ref 30.0–36.0)
MCV: 83.6 fL (ref 80.0–100.0)
MCV: 84.6 fL (ref 80.0–100.0)
Platelets: 246 10*3/uL (ref 150–400)
Platelets: 236 10*3/uL (ref 150–400)
RBC: 2.68 MIL/uL — ABNORMAL LOW (ref 4.22–5.81)
RBC: 2.85 MIL/uL — ABNORMAL LOW (ref 4.22–5.81)
RDW: 18.6 % — ABNORMAL HIGH (ref 11.5–15.5)
RDW: 18.6 % — ABNORMAL HIGH (ref 11.5–15.5)
WBC: 13.1 10*3/uL — ABNORMAL HIGH (ref 4.0–10.5)
WBC: 12.3 10*3/uL — ABNORMAL HIGH (ref 4.0–10.5)
nRBC: 0.2 % (ref 0.0–0.2)
nRBC: 0 % (ref 0.0–0.2)

## 2021-08-29 LAB — PROTIME-INR
INR: 1.7 — ABNORMAL HIGH (ref 0.8–1.2)
Prothrombin Time: 19.6 seconds — ABNORMAL HIGH (ref 11.4–15.2)

## 2021-08-29 LAB — SURGICAL PATHOLOGY

## 2021-08-29 MED ORDER — HYDROMORPHONE HCL 1 MG/ML IJ SOLN
0.5000 mg | INTRAMUSCULAR | Status: DC | PRN
  Administered 2021-08-29 – 2021-09-02 (×16): 0.5 mg via INTRAVENOUS
  Filled 2021-08-29 (×2): qty 1
  Filled 2021-08-29 (×2): qty 0.5
  Filled 2021-08-29: qty 1
  Filled 2021-08-29 (×2): qty 0.5
  Filled 2021-08-29 (×2): qty 1
  Filled 2021-08-29: qty 0.5
  Filled 2021-08-29 (×2): qty 1
  Filled 2021-08-29: qty 0.5
  Filled 2021-08-29 (×3): qty 1

## 2021-08-29 MED ORDER — CHLORHEXIDINE GLUCONATE CLOTH 2 % EX PADS
6.0000 | MEDICATED_PAD | Freq: Every day | CUTANEOUS | Status: DC
  Administered 2021-08-29 – 2021-09-14 (×17): 6 via TOPICAL

## 2021-08-29 MED ORDER — PHYTONADIONE 5 MG PO TABS
5.0000 mg | ORAL_TABLET | Freq: Once | ORAL | Status: AC
  Administered 2021-08-29: 5 mg via ORAL
  Filled 2021-08-29: qty 1

## 2021-08-29 NOTE — Progress Notes (Signed)
Referring Physician(s): Candiss Norse, V.   Supervising Physician: Sandi Mariscal  Patient Status:  [facility] - In-pt  Chief Complaint:  S/p right random renal bx on 04/23/8412 complicated by hypotension and near syncopal episode.   Subjective:  Patient laying in bed, not in acute distress. Patient states that he is doing fine and all of the issue he is having is related to the " clot in his kidney."  Denies shortness of breath, lightheadedness, chest palpitation, and right flank pain.   Allergies: Patient has no known allergies.  Medications: Prior to Admission medications   Medication Sig Start Date End Date Taking? Authorizing Provider  amoxicillin-clavulanate (AUGMENTIN) 875-125 MG tablet Take 1 tablet by mouth 2 (two) times daily. 08/14/21  Yes [provider]  Aspirin-Salicylamide-Caffeine (BC FAST PAIN RELIEF) 650-195-33.3 MG PACK Take 1 Package by mouth daily as needed (pain).   Yes [provider]  promethazine-dextromethorphan (PROMETHAZINE-DM) 6.25-15 MG/5ML syrup Take 5 mLs by mouth every 4 (four) hours as needed for cough. 08/14/21  Yes [provider]     Vital Signs: BP 107/67 (BP Location: Right Arm)   Pulse 69   Temp 98.2 F (36.8 C) (Oral)   Resp 17   Ht 5\' 10"  (1.778 m)   Wt 180 lb (81.6 kg)   SpO2 100%   BMI 25.83 kg/m   Physical Exam Vitals reviewed.  Constitutional:      Appearance: Normal appearance.  HENT:     Head: Normocephalic and atraumatic.  Pulmonary:     Effort: Pulmonary effort is normal.  Abdominal:     General: Abdomen is flat.     Palpations: Abdomen is soft.  Genitourinary:    Comments: Foley catheter in place, on CBI. Urine in the Foley bag is light pink, no clot noted.  Skin:    General: Skin is warm and dry.     Coloration: Skin is not jaundiced or pale.     Comments: Positive dressing on right flank puncture site. Site is unremarkable with no erythema, edema, tenderness, bleeding or drainage. Minimal amount of  old, dry blood noted on the dressing. Dressing otherwise clean, dry, and intact.    Neurological:     Mental Status: He is alert and oriented to person, place, and time.  Psychiatric:        Mood and Affect: Mood normal.        Behavior: Behavior normal.        Judgment: Judgment normal.    Imaging: CT ABDOMEN PELVIS WO CONTRAST  Result Date: 08/29/2021 CLINICAL DATA:  Post right-sided renal biopsy, now with hematuria and hypotension. EXAM: CT ABDOMEN AND PELVIS WITHOUT CONTRAST TECHNIQUE: Multidetector CT imaging of the abdomen and pelvis was performed following the standard protocol without IV contrast. COMPARISON:  CT abdomen pelvis-08/23/2021; ultrasound-guided right renal biopsy-earlier same day FINDINGS: The lack of intravenous contrast limits the ability to evaluate solid abdominal organs. Lower chest: Limited visualization of the lower thorax demonstrates interval development of trace bilateral effusion with worsening bibasilar heterogeneous/consolidative opacities, right greater than left. Previously identified nonspecific ground-glass opacities within the imaged lung bases is not seen on the present examination though there is mild residual intraseptal thickening. Normal heart size. Trace amount of pericardial fluid, unchanged presumably physiologic. There is diffuse decreased attenuation intra cardiac blood pool suggestive of anemia. Hepatobiliary: Normal hepatic contour. Apparent high density material within the gallbladder could represent biliary sludge. No definitive gallbladder wall thickening or pericholecystic stranding on this noncontrast examination. No ascites.  Pancreas: Normal noncontrast appearance of the pancreas. Spleen: Normal noncontrast appearance of the spleen. Adrenals/Urinary Tract: There is a very tiny (approximately 2.7 x 1.6 x 1.2 cm) perinephric hematoma about the inferior pole of the right kidney (axial image 44, series 3; coronal image 57, series 6), with minimal  amount of adjacent perinephric stranding. High-density material is seen within the right renal collecting system and ureter with moderate to large amount of layering high-density material within urinary bladder, findings compatible with hemorrhage into the collecting system and bladder. Mild associated right-sided pelviectasis and ureterectasis. Normal noncontrast appearance of the left kidney. No evidence of left-sided nephrolithiasis or urinary obstruction. Normal noncontrast appearance of the bilateral adrenal glands. Stomach/Bowel: Scattered minimal colonic diverticulosis without evidence of superimposed acute diverticulitis on this noncontrast examination. Normal appearance of the terminal ileum. The appendix is not visualized compatible with provided operative history. No discrete areas of bowel wall thickening on this noncontrast examination. No pneumoperitoneum, pneumatosis or portal venous gas. Vascular/Lymphatic: Moderate amount of atherosclerotic plaque within normal caliber abdominal aorta. Scattered retroperitoneal lymph nodes are numerous though individually not enlarged by size criteria with index left sided periaortic lymph node measuring 0.7 cm in greatest short axis diameter (image 31, series 3), presumably reactive in etiology. No bulky retroperitoneal, mesenteric, pelvic or inguinal lymphadenopathy on this noncontrast examination Reproductive: Dystrophic calcifications within normal sized prostate gland. Trace amount of fluid within the pelvic cul-de-sac. Other: Small bilateral mesenteric fat containing inguinal hernias, left greater than right. Minimal amount of subcutaneous edema about the midline of the low back. Presumed shrapnel is seen within the right lower abdominal/pelvic ventral abdominal wall. Musculoskeletal: No acute or aggressive osseous abnormalities. Mild-to-moderate multilevel lumbar spine DDD, worse at L4-L5 and L5-S1 with disc space height loss, endplate irregularity and small  posteriorly directed disc osteophyte complexes at these locations. Mild degenerative change the bilateral hips with joint space loss, subchondral sclerosis and osteophytosis, right greater than left. IMPRESSION: 1. Post right-sided renal biopsy complicated by tiny (approximately 2.7 cm) perinephric hematoma and bleeding into the right renal collecting system including moderate to large-sized clot within the urinary bladder and associated mild right-sided pelviectasis and ureterectasis. Consideration for initiation of continuous bladder irrigation could be performed as indicated. 2. Trace bilateral effusions with associated bibasilar opacities, right greater than left, likely atelectasis. 3. Colonic diverticulosis without evidence superimposed acute diverticulitis. 4.  Aortic Atherosclerosis (ICD10-I70.0). Critical Value/emergent results were called by telephone at the time of interpretation on 08/28/2021 at 5:04 pm to provider [facility] , who verbally acknowledged these results. Electronically Signed   By: Sandi Mariscal M.D.   On: 08/29/2021 10:38   US BIOPSY (KIDNEY)  Result Date: 08/28/2021 INDICATION: Acute kidney injury of uncertain etiology. Please perform image guided biopsy for tissue diagnostic purposes. EXAM: ULTRASOUND GUIDED RENAL BIOPSY COMPARISON:  CT abdomen and pelvis-08/23/2021 MEDICATIONS: None. ANESTHESIA/SEDATION: Fentanyl 100 mcg IV; Versed 2 mg IV Total Moderate Sedation time: 14 minutes; The patient was continuously monitored during the procedure by the interventional radiology nurse under my direct supervision. COMPLICATIONS: None immediate. PROCEDURE: Informed written consent was obtained from the patient after a discussion of the risks, benefits and alternatives to treatment. The patient understands and consents the procedure. A timeout was performed prior to the initiation of the procedure. Ultrasound scanning was performed of the bilateral flanks. The inferior pole of the right kidney  was selected for biopsy due to location and sonographic window. The procedure was planned. The operative site was prepped and draped in the  usual sterile fashion. The overlying soft tissues were anesthetized with 1% lidocaine with epinephrine. A 17 gauge core needle biopsy device was advanced into the inferior cortex of the right kidney and 3 core biopsies were obtained under direct ultrasound guidance. Images were saved for documentation purposes. The biopsy device was removed and hemostasis was obtained with manual compression. Post procedural scanning was negative for significant post procedural hemorrhage or additional complication. A dressing was placed. The patient tolerated the procedure well without immediate post procedural complication. IMPRESSION: Technically successful ultrasound guided right renal biopsy. Electronically Signed   By: Sandi Mariscal M.D.   On: 08/28/2021 12:52    Labs:  CBC: Recent Labs    08/28/21 1617 08/28/21 1840 08/29/21 0040 08/29/21 0637  WBC 12.3* 14.5* 13.1* 12.3*  HGB 8.5* 8.4* 7.1* 7.4*  HCT 26.3* 26.5* 22.4* 24.1*  PLT 265 298 246 236    COAGS: Recent Labs    08/24/21 1128 08/28/21 0305 08/29/21 0637  INR 1.6* 1.5* 1.7*    BMP: Recent Labs    08/26/21 0006 08/27/21 0029 08/28/21 0305 08/29/21 0040  NA 137 139 138 137  K 4.0 3.8 3.6 4.1  CL 111 111 109 109  CO2 19* 21* 21* 20*  GLUCOSE 106* 92 93 113*  BUN 40* 35* 33* 34*  CALCIUM 7.8* 8.2* 8.3* 8.1*  CREATININE 5.52* 5.09* 5.03* 5.06*  GFRNONAA 10* 11* 12* 11*    LIVER FUNCTION TESTS: Recent Labs    08/23/21 1037 08/24/21 0055 08/25/21 0758 08/26/21 1036 08/27/21 0029 08/28/21 0305 08/29/21 0040  BILITOT 0.3 0.4  --  0.8  --   --   --   AST 17 14*  --  14*  --   --   --   ALT 10 10  --  9  --   --   --   ALKPHOS 66 57  --  56  --   --   --   PROT 5.6* 4.8*  --  5.0*  --   --   --   ALBUMIN 2.2* 1.9*   < > 1.9* 1.8* 1.8* 1.6*   < > = values in this interval not displayed.     Assessment and Plan:  70 yo male S/p right random renal bx on 07/25/7289 complicated by hypotension and near syncopal episode.   Pt underwent CT abdomen pelvis without which showed tiny perinephric hematoma and bleeding into the right renal collecting system including moderate to large sided clot within the urinary bladderand associated mild right-sided pelviectasis and ureterectasis.  Right flank puncture site stable, no flank pain or ecchymosis noted.  Patient is undergoing CBI.  VSS  Primary team trending H/H, hgb 7.4 this morning (7.1 at [facility], 8.4 on 9/6.)  RF stable   Further treatment plan per nephrology/urology/  Appreciate and agree with the plan.  Please call IR for questions and concerns.     Electronically Signed: Tera Mater, PA-C 08/29/2021, 11:10 AM   I spent a total of 25 Minutes at the the patient's bedside AND on the patient's hospital floor or unit, greater than 50% of which was counseling/coordinating care for random renal bx, complicated by hypotension and near syncopal episode.

## 2021-08-29 NOTE — Progress Notes (Addendum)
[facility] NEPHROLOGY PROGRESS NOTE  Assessment/ Plan:  #Acute kidney injury, nonoliguric, with history of excessive NSAID's use/BC powders every day.  Peaked creatinine level of 6.95.  CT scan without obstruction or hydronephrosis.  UA with more than 300 protein and microscopic hematuria.  Serology evaluation including ANA, ANCA, C3, C4, SPEP, anti-GBM, double-stranded DNA, hep B, hep C, HIV negative.   He underwent IR guided kidney biopsy on 9/6 complicated by postbiopsy bleeding.  Seen by urologist and currently undergoing CBI.   Creatinine level stable and has no features of uremia.  I recommend continuing gentle IV hydration.  No need for dialysis at this time.  #Anemia, complicated by acute blood loss postbiopsy: Urology and IR is following.  If no improvement in bleeding then may need embolization of bleeding blood vessel.  FOBT negative, transfuse blood as needed.  Treated with IV iron.  #History of nephrolithiasis: Repeat CT scan without any obstruction or hydronephrosis.  Urology is following  #Metabolic acidosis: Continue oral sodium bicarbonate, monitor CO2 level.  Discussed with the patient's daughter-in-law and the primary team.  Subjective: Seen and examined.  The event noted including CT scan postbiopsy, bleeding, requiring CBI etc.  He feels good without any complaint this morning.  Denies nausea, vomiting, chest pain, shortness of breath.  Denies dizziness.  Urine output recorded 4.2 L but it might be confounding because of CBI. Objective Vital signs in last 24 hours: Vitals:   08/29/21 0200 08/29/21 0300 08/29/21 0330 08/29/21 0757  BP: 118/69 105/64 (!) 104/54 107/67  Pulse: 66 65 67 69  Resp: 16 20 10 17   Temp:    98.2 F (36.8 C)  TempSrc:    Oral  SpO2:   100% 100%  Weight:      Height:       Weight change:   Intake/Output Summary (Last 24 hours) at 08/29/2021 1047 Last data filed at 08/29/2021 1043 Gross per 24 hour  Intake 6395.86 ml  Output  15050 ml  Net -8654.14 ml        Labs: Basic Metabolic Panel: Recent Labs  Lab 08/27/21 0029 08/28/21 0305 08/29/21 0040  NA 139 138 137  K 3.8 3.6 4.1  CL 111 109 109  CO2 21* 21* 20*  GLUCOSE 92 93 113*  BUN 35* 33* 34*  CREATININE 5.09* 5.03* 5.06*  CALCIUM 8.2* 8.3* 8.1*  PHOS 4.3 4.7* 6.5*    Liver Function Tests: Recent Labs  Lab 08/23/21 1037 08/24/21 0055 08/25/21 0758 08/26/21 1036 08/27/21 0029 08/28/21 0305 08/29/21 0040  AST 17 14*  --  14*  --   --   --   ALT 10 10  --  9  --   --   --   ALKPHOS 66 57  --  56  --   --   --   BILITOT 0.3 0.4  --  0.8  --   --   --   PROT 5.6* 4.8*  --  5.0*  --   --   --   ALBUMIN 2.2* 1.9*   < > 1.9* 1.8* 1.8* 1.6*   < > = values in this interval not displayed.    Recent Labs  Lab 08/23/21 1037  LIPASE 70*    No results for input(s): AMMONIA in the last 168 hours. CBC: Recent Labs  Lab 08/28/21 0305 08/28/21 1617 08/28/21 1840 08/29/21 0040 08/29/21 0637  WBC 9.3 12.3* 14.5* 13.1* 12.3*  NEUTROABS 5.8  --   --   --   --  HGB 8.4* 8.5* 8.4* 7.1* 7.4*  HCT 26.8* 26.3* 26.5* 22.4* 24.1*  MCV 82.2 82.4 82.6 83.6 84.6  PLT 263 265 298 246 236    Cardiac Enzymes: No results for input(s): CKTOTAL, CKMB, CKMBINDEX, TROPONINI in the last 168 hours. CBG: No results for input(s): GLUCAP in the last 168 hours.  Iron Studies: No results for input(s): IRON, TIBC, TRANSFERRIN, FERRITIN in the last 72 hours. Studies/Results: CT ABDOMEN PELVIS WO CONTRAST  Result Date: 08/29/2021 CLINICAL DATA:  Post right-sided renal biopsy, now with hematuria and hypotension. EXAM: CT ABDOMEN AND PELVIS WITHOUT CONTRAST TECHNIQUE: Multidetector CT imaging of the abdomen and pelvis was performed following the standard protocol without IV contrast. COMPARISON:  CT abdomen pelvis-08/23/2021; ultrasound-guided right renal biopsy-earlier same day FINDINGS: The lack of intravenous contrast limits the ability to evaluate solid  abdominal organs. Lower chest: Limited visualization of the lower thorax demonstrates interval development of trace bilateral effusion with worsening bibasilar heterogeneous/consolidative opacities, right greater than left. Previously identified nonspecific ground-glass opacities within the imaged lung bases is not seen on the present examination though there is mild residual intraseptal thickening. Normal heart size. Trace amount of pericardial fluid, unchanged presumably physiologic. There is diffuse decreased attenuation intra cardiac blood pool suggestive of anemia. Hepatobiliary: Normal hepatic contour. Apparent high density material within the gallbladder could represent biliary sludge. No definitive gallbladder wall thickening or pericholecystic stranding on this noncontrast examination. No ascites. Pancreas: Normal noncontrast appearance of the pancreas. Spleen: Normal noncontrast appearance of the spleen. Adrenals/Urinary Tract: There is a very tiny (approximately 2.7 x 1.6 x 1.2 cm) perinephric hematoma about the inferior pole of the right kidney (axial image 44, series 3; coronal image 57, series 6), with minimal amount of adjacent perinephric stranding. High-density material is seen within the right renal collecting system and ureter with moderate to large amount of layering high-density material within urinary bladder, findings compatible with hemorrhage into the collecting system and bladder. Mild associated right-sided pelviectasis and ureterectasis. Normal noncontrast appearance of the left kidney. No evidence of left-sided nephrolithiasis or urinary obstruction. Normal noncontrast appearance of the bilateral adrenal glands. Stomach/Bowel: Scattered minimal colonic diverticulosis without evidence of superimposed acute diverticulitis on this noncontrast examination. Normal appearance of the terminal ileum. The appendix is not visualized compatible with provided operative history. No discrete areas of  bowel wall thickening on this noncontrast examination. No pneumoperitoneum, pneumatosis or portal venous gas. Vascular/Lymphatic: Moderate amount of atherosclerotic plaque within normal caliber abdominal aorta. Scattered retroperitoneal lymph nodes are numerous though individually not enlarged by size criteria with index left sided periaortic lymph node measuring 0.7 cm in greatest short axis diameter (image 31, series 3), presumably reactive in etiology. No bulky retroperitoneal, mesenteric, pelvic or inguinal lymphadenopathy on this noncontrast examination Reproductive: Dystrophic calcifications within normal sized prostate gland. Trace amount of fluid within the pelvic cul-de-sac. Other: Small bilateral mesenteric fat containing inguinal hernias, left greater than right. Minimal amount of subcutaneous edema about the midline of the low back. Presumed shrapnel is seen within the right lower abdominal/pelvic ventral abdominal wall. Musculoskeletal: No acute or aggressive osseous abnormalities. Mild-to-moderate multilevel lumbar spine DDD, worse at L4-L5 and L5-S1 with disc space height loss, endplate irregularity and small posteriorly directed disc osteophyte complexes at these locations. Mild degenerative change the bilateral hips with joint space loss, subchondral sclerosis and osteophytosis, right greater than left. IMPRESSION: 1. Post right-sided renal biopsy complicated by tiny (approximately 2.7 cm) perinephric hematoma and bleeding into the right renal collecting system including moderate  to large-sized clot within the urinary bladder and associated mild right-sided pelviectasis and ureterectasis. Consideration for initiation of continuous bladder irrigation could be performed as indicated. 2. Trace bilateral effusions with associated bibasilar opacities, right greater than left, likely atelectasis. 3. Colonic diverticulosis without evidence superimposed acute diverticulitis. 4.  Aortic Atherosclerosis  (ICD10-I70.0). Critical Value/emergent results were called by telephone at the time of interpretation on 08/28/2021 at 5:04 pm to provider [facility] , who verbally acknowledged these results. Electronically Signed   By: Sandi Mariscal M.D.   On: 08/29/2021 10:38   US BIOPSY (KIDNEY)  Result Date: 08/28/2021 INDICATION: Acute kidney injury of uncertain etiology. Please perform image guided biopsy for tissue diagnostic purposes. EXAM: ULTRASOUND GUIDED RENAL BIOPSY COMPARISON:  CT abdomen and pelvis-08/23/2021 MEDICATIONS: None. ANESTHESIA/SEDATION: Fentanyl 100 mcg IV; Versed 2 mg IV Total Moderate Sedation time: 14 minutes; The patient was continuously monitored during the procedure by the interventional radiology nurse under my direct supervision. COMPLICATIONS: None immediate. PROCEDURE: Informed written consent was obtained from the patient after a discussion of the risks, benefits and alternatives to treatment. The patient understands and consents the procedure. A timeout was performed prior to the initiation of the procedure. Ultrasound scanning was performed of the bilateral flanks. The inferior pole of the right kidney was selected for biopsy due to location and sonographic window. The procedure was planned. The operative site was prepped and draped in the usual sterile fashion. The overlying soft tissues were anesthetized with 1% lidocaine with epinephrine. A 17 gauge core needle biopsy device was advanced into the inferior cortex of the right kidney and 3 core biopsies were obtained under direct ultrasound guidance. Images were saved for documentation purposes. The biopsy device was removed and hemostasis was obtained with manual compression. Post procedural scanning was negative for significant post procedural hemorrhage or additional complication. A dressing was placed. The patient tolerated the procedure well without immediate post procedural complication. IMPRESSION: Technically successful  ultrasound guided right renal biopsy. Electronically Signed   By: Sandi Mariscal M.D.   On: 08/28/2021 12:52    Medications: Infusions:  sodium chloride     sodium chloride 10 mL/hr at 08/27/21 2147   lactated ringers 75 mL/hr at 08/29/21 0941   sodium chloride irrigation      Scheduled Medications:  opium-belladonna  1 suppository Rectal Q8H   Chlorhexidine Gluconate Cloth  6 each Topical Daily   cyanocobalamin  1,000 mcg Subcutaneous [id number]   folic acid  2 mg Oral Daily   melatonin  5 mg Oral QHS   nicotine  14 mg Transdermal Q24H   pantoprazole  40 mg Oral BID AC   sodium bicarbonate  1,300 mg Oral TID   tamsulosin  0.4 mg Oral Daily    have reviewed scheduled and prn medications.  Physical Exam: General: Able to lie on bed, not in distress Heart:RRR, s1s2 nl Lungs: Clear b/l, no crackle Abdomen: Soft, nontender, nondistended Extremities:No LE edema Neurology: Alert, awake, following commands  Hanad Leino Prasad Beatrice Ziehm 08/29/2021,10:47 AM  LOS: 6 days

## 2021-08-29 NOTE — Progress Notes (Signed)
PROGRESS NOTE        PATIENT DETAILS Name: Caleb Simmons Age: 70 y.o. Sex: male Date of Birth: [birth year] Admit Date: 08/23/2021 Admitting Physician Costin Karlyne Greenspan, MD PCP:Pcp, No  Brief Narrative: Patient is a 70 y.o. male with history of nephrolithiasis-presenting with generalized weakness/shortness of breath-found to have AKI and severe normocytic anemia.  Patient's renal function improved with supportive care-he was found to have a glomerular pathology causing AKI-underwent renal biopsy on 9/6-unfortunately developed bleeding into the collecting system postbiopsy-causing acute urinary retention/clot retention requiring three-way Foley catheter insertion and CBI.    Significant events: 9/1>> admit for evaluation of AKI/severe anemia. 9/6>> ultrasound-guided biopsy of right kidney 9/6>> developed bleeding post kidney biopsy-presyncope/hypotension responded to IV fluid.  Subsequently developed acute urinary retention-requiring three-way Foley catheter insertion with CBI.  Significant studies: 9/1>> CXR: No pneumonia 9/1>> VQ scan: No PE 9/1>> CT renal stone study: No hydronephrosis, distended bladder.  Diverticulosis. 9/1>> FOBT: Negative 9/1>> UA: Protein>> 300, RBCs 11-20/hpf 9/1>> HIV: Nonreactive 9/1>> HBsAg/HCV Ab: Nonreactive 9/1>> C3/C4: Normal limits 9/1>> glomerular basement Ab: Negative 9/1>> ANA: Negative 9/1>> dsDNA: Negative 9/1>> ANCA titers: Negative 9/1>> SPEP: no M  spike 9/2>> Echo: EF 96-22%, grade 1 diastolic dysfunction. 9/4>>Folic acid :2.9(NLG) 9/2>>JJH [id number] 9/6>> CT abdomen/pelvis: Tiny perinephric hematoma, bleeding into the right renal collecting system   Antimicrobial therapy: None  Microbiology data: 9/1>> COVID PCR: Negative  Procedures : 9/06>> ultrasound-guided biopsy of right kidney  Consults: Nephrology, IR, urology  DVT Prophylaxis : Place and maintain sequential compression device Start: 08/24/21  0519   Subjective: Lying comfortably-tolerated CBI overnight.  Assessment/Plan: Acute kidney injury: Renal function has gradually improved-but creatinine seems to have plateaued around 5.  Extensive serological work-up negative-underwent renal biopsy on 9/6-awaiting results.     Intake/Output Summary (Last 24 hours) at 08/29/2021 1256 Last data filed at 08/29/2021 1215 Gross per 24 hour  Intake 12635.86 ml  Output 16300 ml  Net -3664.14 ml     Hypotension/presyncope-hematuria-acute urinary retention (clot retention) due to bleeding into the collecting system of the right kidney post renal biopsy on 9/6 : Three-way Foley catheter finally inserted by urology yesterday-CBI in progress.  Hypotension resolved.  Continue to watch closely.    Microcytic anemia: Multifactorial anemia-from AKI/acute illness/vitamin B12 and folate deficiency-but lately due to some amount of blood loss secondary to renal biopsy.  Hemoglobin dropped overnight but does not require transfusion.  Continue B12/iron/folate supplementation-repeat CBC tomorrow.    Minimally elevated troponins: Trend is flat-not consistent with ACS-he does not have any chest pain.  Exertional dyspnea was from symptomatic anemia.  Suspect elevated troponin is in the setting of kidney failure.  Echo without any wall motion malady and preserved EF.  Doubt any further work-up is required-especially in light of severity of anemia and AKI.  Anxiety: Continue Xanax while he is in the hospital-is aware that we will likely not continue Xanax post discharge.  Use trazodone for sleep.  Patient is not interested in starting long-acting medications like SSRI for his anxiety-thinks that once he gets home-he will not require any further medications for his anxiety issues.  Tobacco abuse: Continue transdermal nicotine. Diet: Diet Order             Diet regular Room service appropriate? Yes; Fluid consistency: Thin  Diet effective now  Code Status: Full code   Family Communication: Daughter-in-law at bedside  Disposition Plan: Status is: Inpatient  Remains inpatient appropriate because:Inpatient level of care appropriate due to severity of illness  Dispo: The patient is from: Home              Anticipated d/c is to: Home              Patient currently is not medically stable to d/c.   Difficult to place patient No     Barriers to Discharge: Severe AKI-renal biopsy-with bleeding-and acute urinary retention requiring three-way Foley catheter with continuous bladder irrigation.  Antimicrobial agents: Anti-infectives (From admission, onward)    None        Time spent: 35 minutes-Greater than 50% of this time was spent in counseling, explanation of diagnosis, planning of further management, and coordination of care.  MEDICATIONS: Scheduled Meds:  opium-belladonna  1 suppository Rectal Q8H   Chlorhexidine Gluconate Cloth  6 each Topical Daily   cyanocobalamin  1,000 mcg Subcutaneous [id number]   folic acid  2 mg Oral Daily   melatonin  5 mg Oral QHS   nicotine  14 mg Transdermal Q24H   pantoprazole  40 mg Oral BID AC   sodium bicarbonate  1,300 mg Oral TID   tamsulosin  0.4 mg Oral Daily   Continuous Infusions:  sodium chloride     sodium chloride 10 mL/hr at 08/27/21 2147   lactated ringers 75 mL/hr at 08/29/21 0941   sodium chloride irrigation     PRN Meds:.acetaminophen, ALPRAZolam, bisacodyl, HYDROmorphone (DILAUDID) injection, ondansetron **OR** ondansetron (ZOFRAN) IV, promethazine, technetium albumin aggregated, traZODone   PHYSICAL EXAM: Vital signs: Vitals:   08/29/21 0300 08/29/21 0330 08/29/21 0757 08/29/21 1141  BP: 105/64 (!) 104/54 107/67 118/65  Pulse: 65 67 69 73  Resp: 20 10 17 10   Temp:   98.2 F (36.8 C) 97.7 F (36.5 C)  TempSrc:   Oral Oral  SpO2:  100% 100% 99%  Weight:      Height:       Filed Weights   08/23/21 0816  Weight: 81.6 kg   Body mass index is 25.83  kg/m.   Gen Exam:Alert awake-not in any distress HEENT:atraumatic, normocephalic Chest: B/L clear to auscultation anteriorly CVS:S1S2 regular Abdomen:soft non tender, non distended Extremities:no edema Neurology: Non focal Skin: no rash   I have personally reviewed following labs and imaging studies  LABORATORY DATA: CBC: Recent Labs  Lab 08/28/21 0305 08/28/21 1617 08/28/21 1840 08/29/21 0040 08/29/21 0637  WBC 9.3 12.3* 14.5* 13.1* 12.3*  NEUTROABS 5.8  --   --   --   --   HGB 8.4* 8.5* 8.4* 7.1* 7.4*  HCT 26.8* 26.3* 26.5* 22.4* 24.1*  MCV 82.2 82.4 82.6 83.6 84.6  PLT 263 265 298 246 236     Basic Metabolic Panel: Recent Labs  Lab 08/23/21 0820 08/23/21 1037 08/24/21 0055 08/25/21 0037 08/25/21 0758 08/26/21 0006 08/27/21 0029 08/28/21 0305 08/29/21 0040  NA  --   --  137  --  141 137 139 138 137  K  --   --  4.5  --  4.2 4.0 3.8 3.6 4.1  CL  --   --  110  --  113* 111 111 109 109  CO2  --   --  16*  --  20* 19* 21* 21* 20*  GLUCOSE  --   --  93  --  84 106* 92 93 113*  BUN  --   --  41*  --  39* 40* 35* 33* 34*  CREATININE  --   --  6.12*  --  5.71* 5.52* 5.09* 5.03* 5.06*  CALCIUM  --   --  7.7*  --  8.4* 7.8* 8.2* 8.3* 8.1*  MG  --  1.6* 1.5* 2.1  --   --   --   --   --   PHOS   < >  --  5.6*  --  5.6* 4.3 4.3 4.7* 6.5*   < > = values in this interval not displayed.     GFR: Estimated Creatinine Clearance: 13.8 mL/min (A) (by C-G formula based on SCr of 5.06 mg/dL (H)).  Liver Function Tests: Recent Labs  Lab 08/23/21 1037 08/24/21 0055 08/25/21 0758 08/26/21 0006 08/26/21 1036 08/27/21 0029 08/28/21 0305 08/29/21 0040  AST 17 14*  --   --  14*  --   --   --   ALT 10 10  --   --  9  --   --   --   ALKPHOS 66 57  --   --  56  --   --   --   BILITOT 0.3 0.4  --   --  0.8  --   --   --   PROT 5.6* 4.8*  --   --  5.0*  --   --   --   ALBUMIN 2.2* 1.9*   < > 1.7* 1.9* 1.8* 1.8* 1.6*   < > = values in this interval not displayed.     Recent Labs  Lab 08/23/21 1037  LIPASE 70*    No results for input(s): AMMONIA in the last 168 hours.  Coagulation Profile: Recent Labs  Lab 08/24/21 1128 08/28/21 0305 08/29/21 0637  INR 1.6* 1.5* 1.7*     Cardiac Enzymes: No results for input(s): CKTOTAL, CKMB, CKMBINDEX, TROPONINI in the last 168 hours.  BNP (last 3 results) No results for input(s): PROBNP in the last 8760 hours.  Lipid Profile: No results for input(s): CHOL, HDL, LDLCALC, TRIG, CHOLHDL, LDLDIRECT in the last 72 hours.  Thyroid Function Tests: No results for input(s): TSH, T4TOTAL, FREET4, T3FREE, THYROIDAB in the last 72 hours.  Anemia Panel: No results for input(s): VITAMINB12, FOLATE, FERRITIN, TIBC, IRON, RETICCTPCT in the last 72 hours.   Urine analysis:    Component Value Date/Time   COLORURINE STRAW (A) 08/23/2021 1140   APPEARANCEUR CLEAR 08/23/2021 1140   LABSPEC 1.008 08/23/2021 1140   PHURINE 6.0 08/23/2021 1140   GLUCOSEU 50 (A) 08/23/2021 1140   HGBUR SMALL (A) 08/23/2021 1140   BILIRUBINUR NEGATIVE 08/23/2021 1140   KETONESUR NEGATIVE 08/23/2021 1140   PROTEINUR >=300 (A) 08/23/2021 1140   UROBILINOGEN 0.2 06/26/2007 1023   NITRITE NEGATIVE 08/23/2021 1140   LEUKOCYTESUR NEGATIVE 08/23/2021 1140    Sepsis Labs: Lactic Acid, Venous No results found for: LATICACIDVEN  MICROBIOLOGY: Recent Results (from the past 240 hour(s))  SARS CORONAVIRUS 2 (TAT 6-24 HRS) Nasopharyngeal Nasopharyngeal Swab     Status: None   Collection Time: 08/23/21  5:11 PM   Specimen: Nasopharyngeal Swab  Result Value Ref Range Status   SARS Coronavirus 2 NEGATIVE NEGATIVE Final    Comment: (NOTE) SARS-CoV-2 target nucleic acids are NOT DETECTED.  The SARS-CoV-2 RNA is generally detectable in upper and lower respiratory specimens during the acute phase of infection. Negative results do not preclude SARS-CoV-2 infection, do not rule out co-infections with other pathogens, and should not be  used as  the sole basis for treatment or other patient management decisions. Negative results must be combined with clinical observations, patient history, and epidemiological information. The expected result is Negative.  Fact Sheet for Patients: SugarRoll.be  Fact Sheet for Healthcare Providers: https://www.woods-mathews.com/  This test is not yet approved or cleared by the [place] FDA and  has been authorized for detection and/or diagnosis of SARS-CoV-2 by FDA under an Emergency Use Authorization (EUA). This EUA will remain  in effect (meaning this test can be used) for the duration of the COVID-19 declaration under Se ction 564(b)(1) of the Act, 21 U.S.C. section 360bbb-3(b)(1), unless the authorization is terminated or revoked sooner.  Performed at [facility], [place] [address] [place], [place] [address]     RADIOLOGY STUDIES/RESULTS: CT ABDOMEN PELVIS WO CONTRAST  Result Date: 08/29/2021 CLINICAL DATA:  Post right-sided renal biopsy, now with hematuria and hypotension. EXAM: CT ABDOMEN AND PELVIS WITHOUT CONTRAST TECHNIQUE: Multidetector CT imaging of the abdomen and pelvis was performed following the standard protocol without IV contrast. COMPARISON:  CT abdomen pelvis-08/23/2021; ultrasound-guided right renal biopsy-earlier same day FINDINGS: The lack of intravenous contrast limits the ability to evaluate solid abdominal organs. Lower chest: Limited visualization of the lower thorax demonstrates interval development of trace bilateral effusion with worsening bibasilar heterogeneous/consolidative opacities, right greater than left. Previously identified nonspecific ground-glass opacities within the imaged lung bases is not seen on the present examination though there is mild residual intraseptal thickening. Normal heart size. Trace amount of pericardial fluid, unchanged presumably physiologic. There is diffuse decreased attenuation  intra cardiac blood pool suggestive of anemia. Hepatobiliary: Normal hepatic contour. Apparent high density material within the gallbladder could represent biliary sludge. No definitive gallbladder wall thickening or pericholecystic stranding on this noncontrast examination. No ascites. Pancreas: Normal noncontrast appearance of the pancreas. Spleen: Normal noncontrast appearance of the spleen. Adrenals/Urinary Tract: There is a very tiny (approximately 2.7 x 1.6 x 1.2 cm) perinephric hematoma about the inferior pole of the right kidney (axial image 44, series 3; coronal image 57, series 6), with minimal amount of adjacent perinephric stranding. High-density material is seen within the right renal collecting system and ureter with moderate to large amount of layering high-density material within urinary bladder, findings compatible with hemorrhage into the collecting system and bladder. Mild associated right-sided pelviectasis and ureterectasis. Normal noncontrast appearance of the left kidney. No evidence of left-sided nephrolithiasis or urinary obstruction. Normal noncontrast appearance of the bilateral adrenal glands. Stomach/Bowel: Scattered minimal colonic diverticulosis without evidence of superimposed acute diverticulitis on this noncontrast examination. Normal appearance of the terminal ileum. The appendix is not visualized compatible with provided operative history. No discrete areas of bowel wall thickening on this noncontrast examination. No pneumoperitoneum, pneumatosis or portal venous gas. Vascular/Lymphatic: Moderate amount of atherosclerotic plaque within normal caliber abdominal aorta. Scattered retroperitoneal lymph nodes are numerous though individually not enlarged by size criteria with index left sided periaortic lymph node measuring 0.7 cm in greatest short axis diameter (image 31, series 3), presumably reactive in etiology. No bulky retroperitoneal, mesenteric, pelvic or inguinal  lymphadenopathy on this noncontrast examination Reproductive: Dystrophic calcifications within normal sized prostate gland. Trace amount of fluid within the pelvic cul-de-sac. Other: Small bilateral mesenteric fat containing inguinal hernias, left greater than right. Minimal amount of subcutaneous edema about the midline of the low back. Presumed shrapnel is seen within the right lower abdominal/pelvic ventral abdominal wall. Musculoskeletal: No acute or aggressive osseous abnormalities. Mild-to-moderate multilevel lumbar spine DDD, worse at L4-L5 and  L5-S1 with disc space height loss, endplate irregularity and small posteriorly directed disc osteophyte complexes at these locations. Mild degenerative change the bilateral hips with joint space loss, subchondral sclerosis and osteophytosis, right greater than left. IMPRESSION: 1. Post right-sided renal biopsy complicated by tiny (approximately 2.7 cm) perinephric hematoma and bleeding into the right renal collecting system including moderate to large-sized clot within the urinary bladder and associated mild right-sided pelviectasis and ureterectasis. Consideration for initiation of continuous bladder irrigation could be performed as indicated. 2. Trace bilateral effusions with associated bibasilar opacities, right greater than left, likely atelectasis. 3. Colonic diverticulosis without evidence superimposed acute diverticulitis. 4.  Aortic Atherosclerosis (ICD10-I70.0). Critical Value/emergent results were called by telephone at the time of interpretation on 08/28/2021 at 5:04 pm to provider [facility] , who verbally acknowledged these results. Electronically Signed   By: Sandi Mariscal M.D.   On: 08/29/2021 10:38   US BIOPSY (KIDNEY)  Result Date: 08/28/2021 INDICATION: Acute kidney injury of uncertain etiology. Please perform image guided biopsy for tissue diagnostic purposes. EXAM: ULTRASOUND GUIDED RENAL BIOPSY COMPARISON:  CT abdomen and pelvis-08/23/2021  MEDICATIONS: None. ANESTHESIA/SEDATION: Fentanyl 100 mcg IV; Versed 2 mg IV Total Moderate Sedation time: 14 minutes; The patient was continuously monitored during the procedure by the interventional radiology nurse under my direct supervision. COMPLICATIONS: None immediate. PROCEDURE: Informed written consent was obtained from the patient after a discussion of the risks, benefits and alternatives to treatment. The patient understands and consents the procedure. A timeout was performed prior to the initiation of the procedure. Ultrasound scanning was performed of the bilateral flanks. The inferior pole of the right kidney was selected for biopsy due to location and sonographic window. The procedure was planned. The operative site was prepped and draped in the usual sterile fashion. The overlying soft tissues were anesthetized with 1% lidocaine with epinephrine. A 17 gauge core needle biopsy device was advanced into the inferior cortex of the right kidney and 3 core biopsies were obtained under direct ultrasound guidance. Images were saved for documentation purposes. The biopsy device was removed and hemostasis was obtained with manual compression. Post procedural scanning was negative for significant post procedural hemorrhage or additional complication. A dressing was placed. The patient tolerated the procedure well without immediate post procedural complication. IMPRESSION: Technically successful ultrasound guided right renal biopsy. Electronically Signed   By: Sandi Mariscal M.D.   On: 08/28/2021 12:52     LOS: 6 days   Oren Binet, MD  [facility]    To contact the attending provider between 7A-7P or the covering provider during after hours 7P-7A, please log into the web site [email or web address] and access using universal [place] password for that web site. If you do not have the password, please call the hospital operator.  08/29/2021, 12:56 PM

## 2021-08-29 NOTE — Care Management Important Message (Signed)
Important Message  Patient Details  Name: Caleb Simmons MRN: [id number] Date of Birth: [birth year]   Medicare Important Message Given:  Yes     Salvatore Poe [address] 08/29/2021, 4:18 PM

## 2021-08-29 NOTE — Progress Notes (Addendum)
Urology Progress Note   Subjective: NAEON.  Foley catheter draining well w/ CBI on moderate drip overnight. No need to flush the catheter overnight per nursing. Patient feeling much more comfortable than yesterday. Bladder and flank pain resolved this AM. Intermittently needing pain medications.  Objective: Vital signs in last 24 hours: Temp:  [97.3 F (36.3 C)-98.1 F (36.7 C)] 97.3 F (36.3 C) (09/06 1940) Pulse Rate:  [65-101] 67 (09/07 0330) Resp:  [0-20] 10 (09/07 0330) BP: (103-162)/(54-91) 104/54 (09/07 0330) SpO2:  [94 %-100 %] 100 % (09/07 0330)  Intake/Output from previous day: 09/06 0701 - 09/07 0700 In: 2285 [I.V.:2285] Out: 6650 [Urine:6650] Intake/Output this shift: Total I/O In: 2285 [I.V.:2285] Out: 5150 [Urine:5150]  Physical Exam:  General: Alert and oriented CV: Regular rate Lungs: No increased work of breathing Abdomen: Soft, non-tender, non-distended. GU: Foley in place draining clear light pink urine on a slow-moderate drip CBI Ext: NT, No erythema  Lab Results: Recent Labs    08/28/21 1617 08/28/21 1840 08/29/21 0040  HGB 8.5* 8.4* 7.1*  HCT 26.3* 26.5* 22.4*   Recent Labs    08/28/21 0305 08/29/21 0040  NA 138 137  K 3.6 4.1  CL 109 109  CO2 21* 20*  GLUCOSE 93 113*  BUN 33* 34*  CREATININE 5.03* 5.06*  CALCIUM 8.3* 8.1*    Studies/Results: US BIOPSY (KIDNEY)  Result Date: 08/28/2021 INDICATION: Acute kidney injury of uncertain etiology. Please perform image guided biopsy for tissue diagnostic purposes. EXAM: ULTRASOUND GUIDED RENAL BIOPSY COMPARISON:  CT abdomen and pelvis-08/23/2021 MEDICATIONS: None. ANESTHESIA/SEDATION: Fentanyl 100 mcg IV; Versed 2 mg IV Total Moderate Sedation time: 14 minutes; The patient was continuously monitored during the procedure by the interventional radiology nurse under my direct supervision. COMPLICATIONS: None immediate. PROCEDURE: Informed written consent was obtained from the patient after a  discussion of the risks, benefits and alternatives to treatment. The patient understands and consents the procedure. A timeout was performed prior to the initiation of the procedure. Ultrasound scanning was performed of the bilateral flanks. The inferior pole of the right kidney was selected for biopsy due to location and sonographic window. The procedure was planned. The operative site was prepped and draped in the usual sterile fashion. The overlying soft tissues were anesthetized with 1% lidocaine with epinephrine. A 17 gauge core needle biopsy device was advanced into the inferior cortex of the right kidney and 3 core biopsies were obtained under direct ultrasound guidance. Images were saved for documentation purposes. The biopsy device was removed and hemostasis was obtained with manual compression. Post procedural scanning was negative for significant post procedural hemorrhage or additional complication. A dressing was placed. The patient tolerated the procedure well without immediate post procedural complication. IMPRESSION: Technically successful ultrasound guided right renal biopsy. Electronically Signed   By: Sandi Mariscal M.D.   On: 08/28/2021 12:52    Assessment/Plan:  70 y.o. male with Hx of nephrolithiasis who was admitted w/ AKI and severe normocytic anemia on 08/23/21. W/ concern for clot retention and ongoing upper tract bleeding following a US guided right renal biopsy on 08/28/21.   Now w/ a hematuria catheter and CBI running. Able to irrigate some small old clots out this AM; however, it is otherwise draining fine.   Hgb w/ slight down trend to 7.1 from 8.4 yesterday. Cr stabilized at 5.06 today.  - Continue Foley catheter to drainage - Continue CBI, titrating to light pink - Continue to trend H/H every 6-8 hours. If concern for ongoing  bleeding consider VIR consultation for possible embolism of upper tract bleeding vessels. - Continue B&O suppositories for bladder spasms. - Urology  will continue to follow  Dispo: Per primary team   LOS: 6 days   Reola Mosher, MD [facility] Urology Resident, [facility]  Addendum: Patient seen this PM.  His foley is basically clear except when he is having spasm.  He notes pain and worsening spasm when the CBI runs out.  He is getting scheduled B&O and dilaudid for pain.   No clots have been passed or irrigated since yesterday.   I suspect that he is having spasm from the catheter, and that removing it at this time would be reasonable.  Will d/c B&O suppository tonight and schedule foley removal in the AM.  He should then be followed closely for signs of retention.

## 2021-08-30 ENCOUNTER — Other Ambulatory Visit (HOSPITAL_COMMUNITY): Payer: Self-pay | Admitting: Emergency Medicine

## 2021-08-30 ENCOUNTER — Inpatient Hospital Stay (HOSPITAL_COMMUNITY): Payer: Commercial Managed Care - PPO

## 2021-08-30 ENCOUNTER — Other Ambulatory Visit (HOSPITAL_COMMUNITY): Payer: Self-pay | Admitting: Radiology

## 2021-08-30 DIAGNOSIS — R778 Other specified abnormalities of plasma proteins: Secondary | ICD-10-CM | POA: Diagnosis not present

## 2021-08-30 DIAGNOSIS — N179 Acute kidney failure, unspecified: Secondary | ICD-10-CM | POA: Diagnosis not present

## 2021-08-30 DIAGNOSIS — D649 Anemia, unspecified: Secondary | ICD-10-CM | POA: Diagnosis not present

## 2021-08-30 HISTORY — PX: IR EMBO ART  VEN HEMORR LYMPH EXTRAV  INC GUIDE ROADMAPPING: IMG5450

## 2021-08-30 HISTORY — PX: IR US GUIDE VASC ACCESS RIGHT: IMG2390

## 2021-08-30 HISTORY — PX: IR FLUORO GUIDE CV LINE RIGHT: IMG2283

## 2021-08-30 LAB — CBC
HCT: 20.8 % — ABNORMAL LOW (ref 39.0–52.0)
Hemoglobin: 6.4 g/dL — CL (ref 13.0–17.0)
MCH: 26.2 pg (ref 26.0–34.0)
MCHC: 30.8 g/dL (ref 30.0–36.0)
MCV: 85.2 fL (ref 80.0–100.0)
Platelets: 239 10*3/uL (ref 150–400)
RBC: 2.44 MIL/uL — ABNORMAL LOW (ref 4.22–5.81)
RDW: 19.1 % — ABNORMAL HIGH (ref 11.5–15.5)
WBC: 12.8 10*3/uL — ABNORMAL HIGH (ref 4.0–10.5)
nRBC: 0 % (ref 0.0–0.2)

## 2021-08-30 LAB — PREPARE RBC (CROSSMATCH)

## 2021-08-30 LAB — HEMOGLOBIN AND HEMATOCRIT, BLOOD
HCT: 23.2 % — ABNORMAL LOW (ref 39.0–52.0)
Hemoglobin: 7.4 g/dL — ABNORMAL LOW (ref 13.0–17.0)

## 2021-08-30 LAB — RENAL FUNCTION PANEL
Albumin: 1.6 g/dL — ABNORMAL LOW (ref 3.5–5.0)
Anion gap: 9 (ref 5–15)
BUN: 35 mg/dL — ABNORMAL HIGH (ref 8–23)
CO2: 23 mmol/L (ref 22–32)
Calcium: 8.3 mg/dL — ABNORMAL LOW (ref 8.9–10.3)
Chloride: 104 mmol/L (ref 98–111)
Creatinine, Ser: 5.56 mg/dL — ABNORMAL HIGH (ref 0.61–1.24)
GFR, Estimated: 10 mL/min — ABNORMAL LOW (ref >60)
Glucose, Bld: 86 mg/dL (ref 70–99)
Phosphorus: 5.2 mg/dL — ABNORMAL HIGH (ref 2.5–4.6)
Potassium: 3.8 mmol/L (ref 3.5–5.1)
Sodium: 136 mmol/L (ref 135–145)

## 2021-08-30 LAB — PROTIME-INR
INR: 1.5 — ABNORMAL HIGH (ref 0.8–1.2)
Prothrombin Time: 18.3 seconds — ABNORMAL HIGH (ref 11.4–15.2)

## 2021-08-30 IMAGING — XA IR EMBO ART  VEN HEMORR LYMPH EXTRAV  INC GUIDE ROADMAPPING
12 of 14 series · 12 of 24 positions shown · IV contrast (IODINE)
Comparison: none

INDICATION: 70-year-old male with history of acute kidney injury of uncertain
etiology status post ultrasound-guided right renal biopsy on
[DATE]. Since biopsy, the patient has experienced gross
hematuria with associated acute anemia.

[Series 2: fl (-) angio · 1 of 1 slices shown (1 of 2)]
[im 1/1]
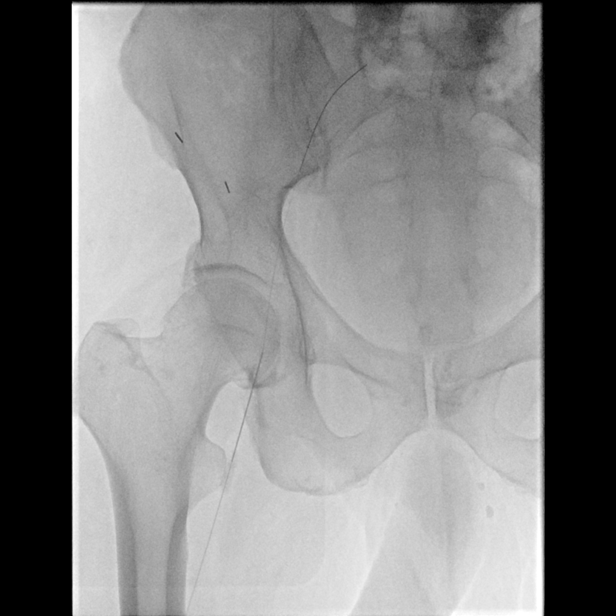

[Series 4: fl (-) angio · 1 of 13 frames shown (2 of 2)]
[frame 7/13]
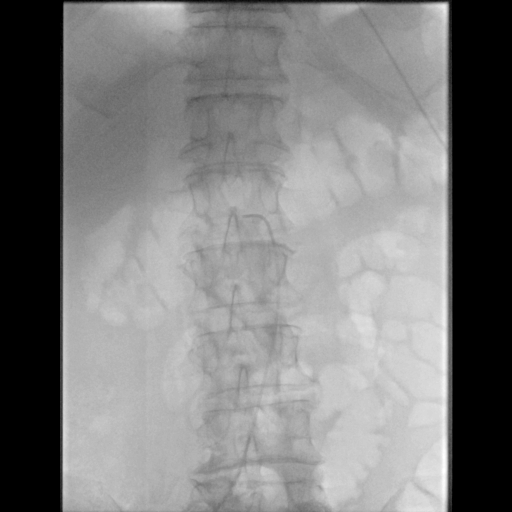

[Series 5: body 4 care · 2 acquisitions, 1 frame shown (1 of 10)]
[im 1/2]
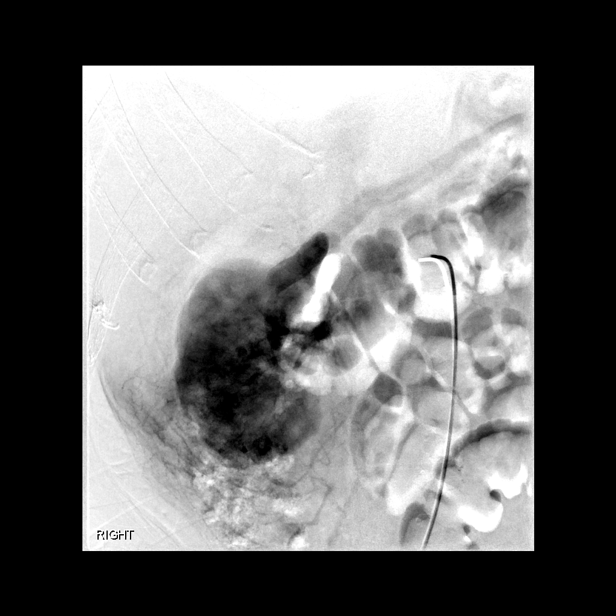

[Series 6: body 4 care · 1 of 2 slices shown (2 of 10)]
[im 2/2]
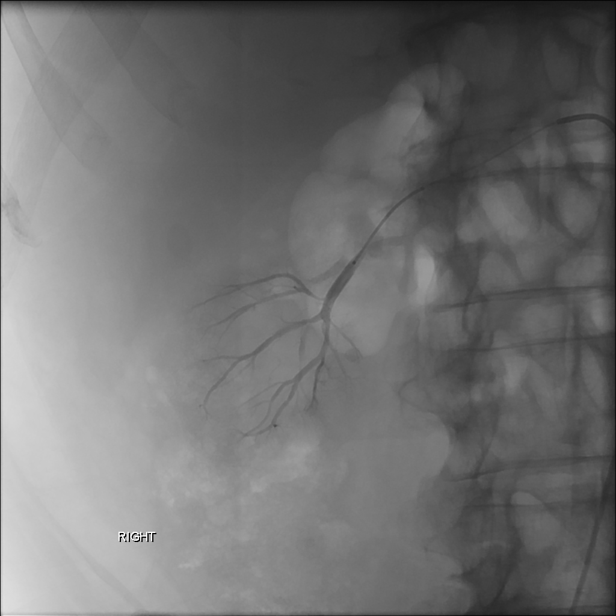

[Series 7: body 4 care · 1 of 2 slices shown (3 of 10)]
[im 2/2]
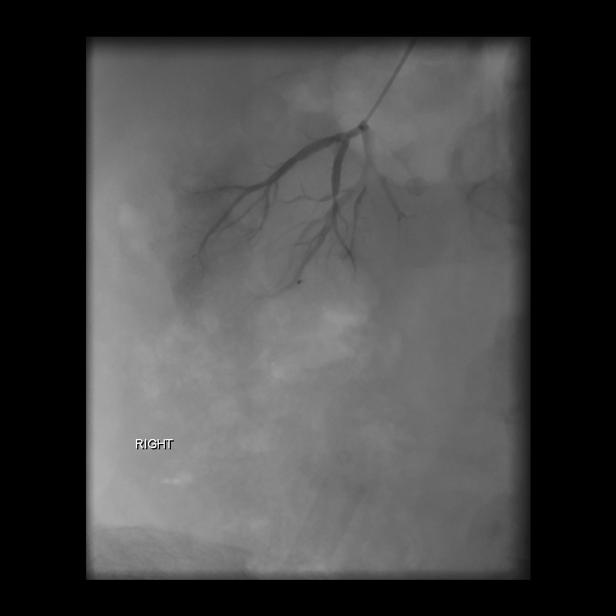

[Series 8: body 4 care · 1 of 2 slices shown (4 of 10)]
[im 2/2]
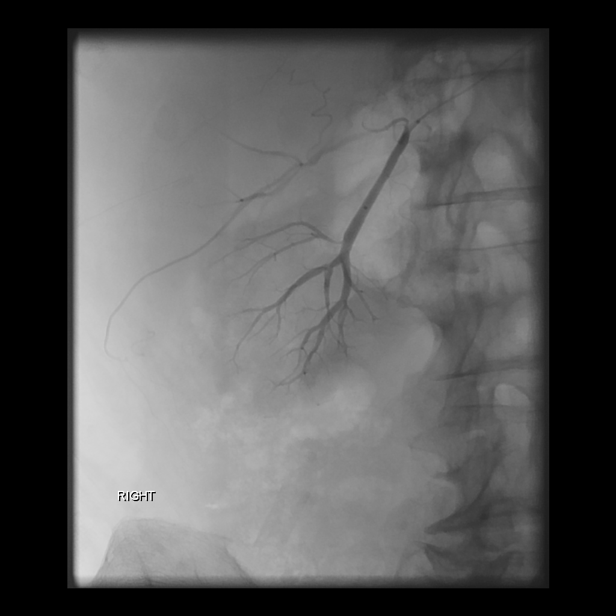

[Series 9: body 4 care · 1 of 2 slices shown (5 of 10)]
[im 2/2]
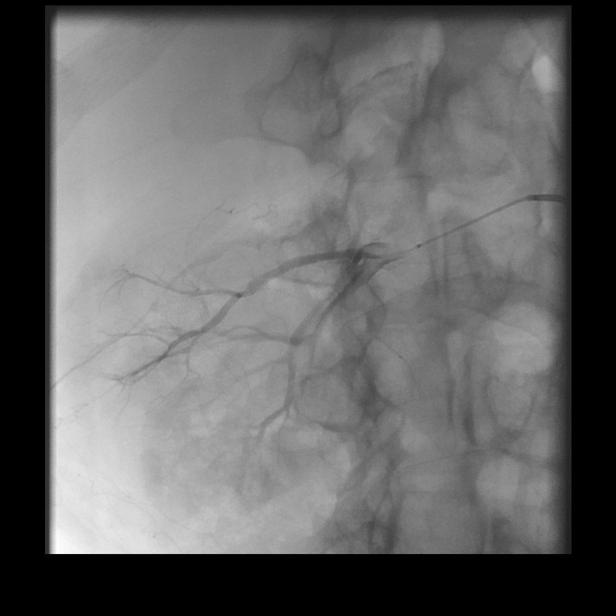

[Series 10: body 4 care · 1 of 2 slices shown (6 of 10)]
[im 2/2]
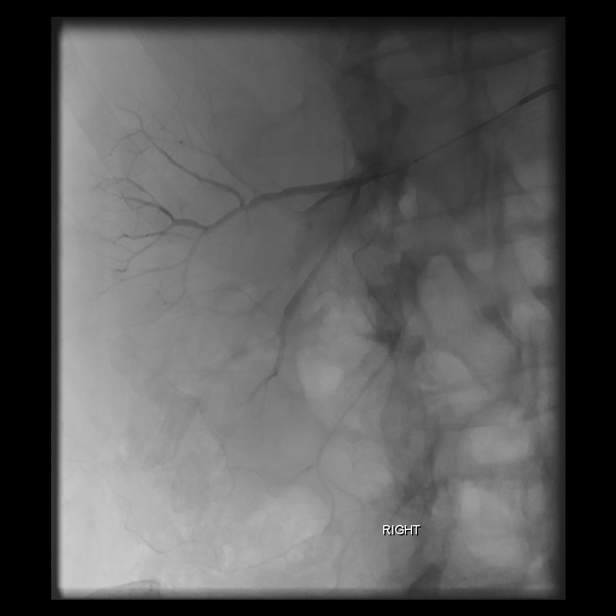

[Series 11: body 4 care · 1 of 2 slices shown (7 of 10)]
[im 2/2]
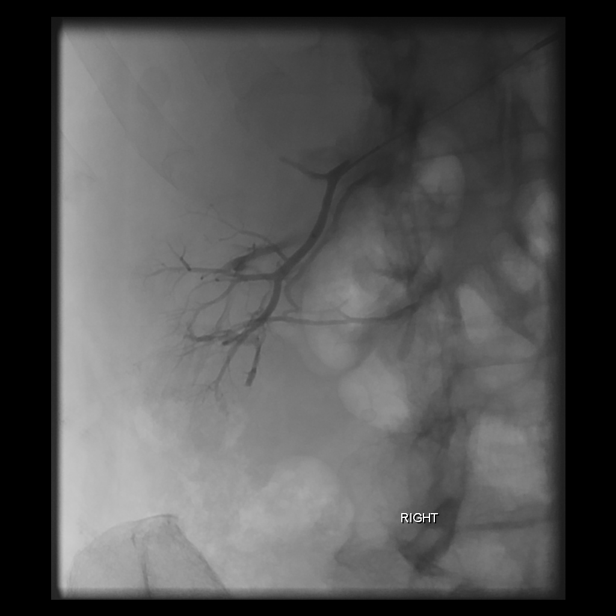

[Series 12: body 4 care · 1 of 2 slices shown (8 of 10)]
[im 2/2]
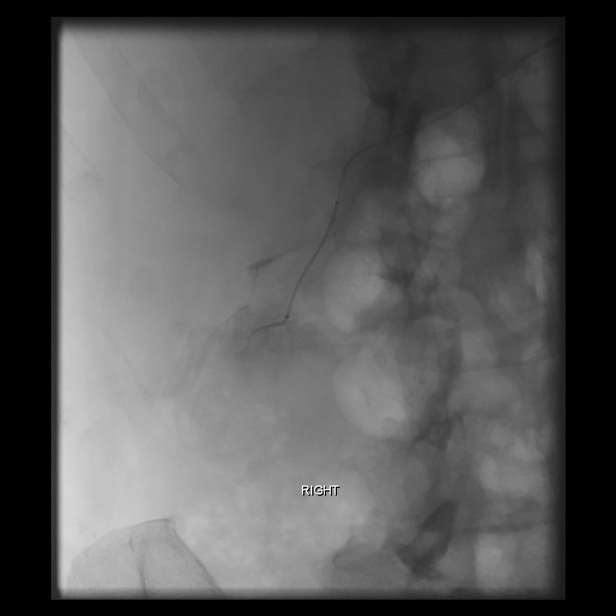

[Series 13: body 4 care · 1 of 2 slices shown (9 of 10)]
[im 2/2]
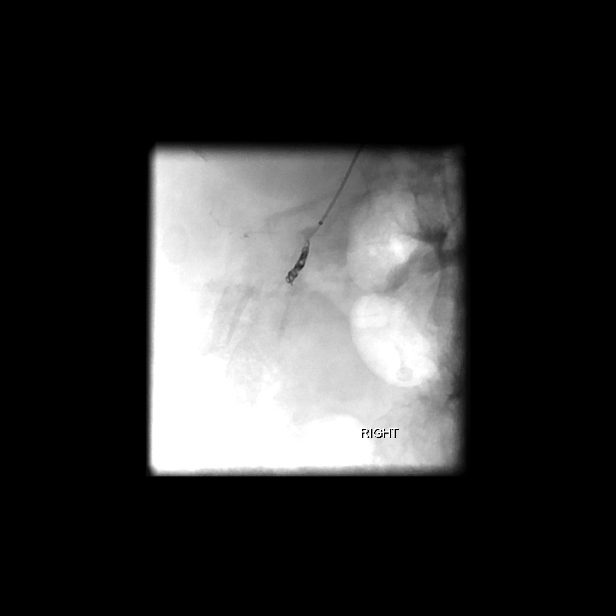

[Series 14: body 4 care · 1 of 2 slices shown (10 of 10)]
[im 2/2]
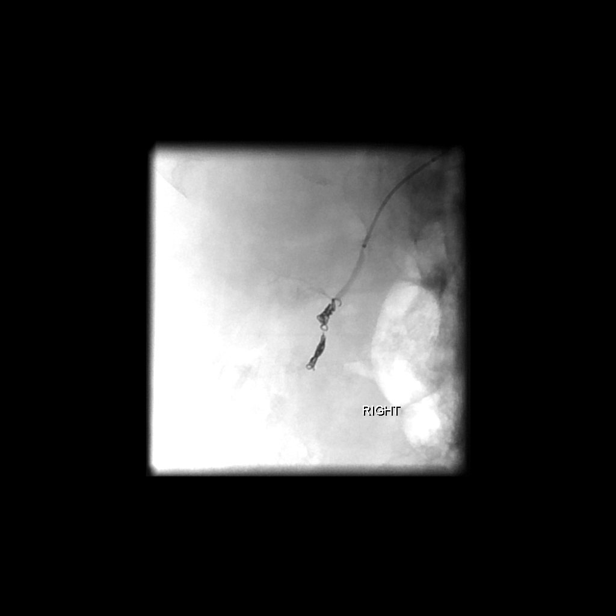

[12 of 24 positions shown; findings below may reference images not displayed]

EXAM:
1. Ultrasound-guided vascular access of the right internal jugular
vein.
2. Temporary hemodialysis catheter placement.
3. Ultrasound-guided vascular access of the right common femoral
artery.
4. Selective catheterization and angiography of the right renal
artery.
5. Sub selective catheterization angiography of right inferior polar
and arcuate artery branches.
6. Coil embolization of right inferior pole arcuate artery branch.

MEDICATIONS:
None.

ANESTHESIA/SEDATION:
Moderate (conscious) sedation was employed during this procedure. A
total of Versed 3 mg and Fentanyl 50 mcg was administered
intravenously.

Moderate Sedation Time: 66 minutes. The patient's level of
consciousness and vital signs were monitored continuously by
radiology nursing throughout the procedure under my direct
supervision.

CONTRAST:  20mL OMNIPAQUE IOHEXOL 350 MG/ML SOLN, 50mL OMNIPAQUE
IOHEXOL 350 MG/ML SOLN

FLUOROSCOPY TIME:  Fluoroscopy Time: 10.6 minutes, (811 mGy).

COMPLICATIONS:
None immediate.



Preprocedure ultrasound evaluation of the right internal jugular
vein demonstrated a patent and compressible vein free of internal
echoes. Procedure was planned. Subdermal Local anesthesia was
provided 1% lidocaine. A small skin nick was made. Under direct
ultrasound visualization, a 21 gauge micropuncture needle was
directed into the internal jugular vein. An image was captured and
stored in the permanent record. A micropuncture set was inserted and
exchanged for a J wire which was positioned in the inferior vena
cava. Serial dilation was performed followed by placement of a
French, 24 cm Trialysis catheter. The catheter tip was positioned in
the right atrium. Each lumen flushed and aspirated appropriately.
The dialysis ports were locked with appropriate volume of heparin
dwell. The middle central venous port was then used for sedation for
the remainder of the procedure. The catheter was secured with a 0
silk retention suture. A sterile bandage was applied.

Preprocedure ultrasound evaluation of the right groin was performed
which demonstrated a patent right common femoral artery. The
procedure was planned. Subdermal Local anesthesia was provided with
1% lidocaine. A small skin nick was made. Under direct ultrasound
visualization, a 21 gauge micropuncture needle was directed into the
common femoral artery. An ultrasound image was captured and stored
in the permanent record. A micro puncture set was inserted and a
limited right lower extremity angiogram was performed which
demonstrated appropriate puncture site for closure device use. A J
wire was directed to the abdominal aorta and the micropuncture set
was exchanged for a 5 French vascular sheath. A 5 French C2 catheter
was then directed into the right renal ostium. Right renal angiogram
was performed. The single main renal artery was patent. About 2
arcuate artery branches in the inferior pole there is abnormal
truncation and vessel irregularity with evidence of an early filling
arteriovenous fistula in addition to suggestion of faint filling
into the collecting system on delayed imaging.

A straight lantern microcatheter and 0.014" soft synchro wire was
then inserted and directed into the inferior polar branch. Repeat
angiogram was performed in the sub selective location which was
significant for multifocal 2-4 mm pseudoaneurysm formation, abnormal
truncation and irregularity of the arcuate and interlobular
branches, an early arteriovenous shunting. The inferior polar
arcuate branch was selected further. Coil embolization was performed
with multiple low profile Penumbra Ruby coils ranging from 2-3 mm in
diameter. Completion right inferior renal angiogram was performed
which demonstrated appropriate embolization of the targeted vessels
without persistent arteriovenous fistula or vessel irregularity.

The catheters were removed. The right common femoral artery was then
closed with a 6 French Angio-Seal device. Distal pulses were
unchanged. The patient tolerated the procedure well was transferred
back to the floor in good condition.
IMPRESSION: 1. Multifocal punctate pseudoaneurysm formation with associated
arteriovenous fistula and evidence of fistulization to the
collecting system arising from the inferior pole of the right
kidney.
2. Sub selective coil embolization of right inferior polar arcuate
artery branch.
3. Successful placement of right internal jugular, 24 French
Trialysis catheter with the catheter tip in the right atrium. The
catheter is ready for immediate use.

## 2021-08-30 IMAGING — CT CT ABD-PELV W/O CM
2 of 4 series · 16 of 46 positions shown, 18 images · non-contrast
Comparison: [DATE]

CLINICAL DATA: Retroperitoneal hematoma, follow up s/p random renal
bx with perinephric hematoma development

EXAM:
CT ABDOMEN AND PELVIS WITHOUT CONTRAST
TECHNIQUE: Multidetector CT imaging of the abdomen and pelvis was performed
following the standard protocol without IV contrast.

[Series 4: abd/ pelvis 5.0 i30f 2 · axial · 0.72mm/px · z∈[+724,+1130]mm · 13 of 89 slices shown, 15 images]
[im 4/89  soft-tissue]
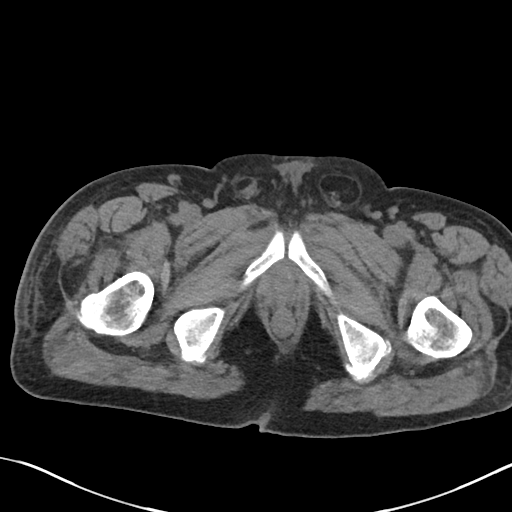
[im 4/89  bone]
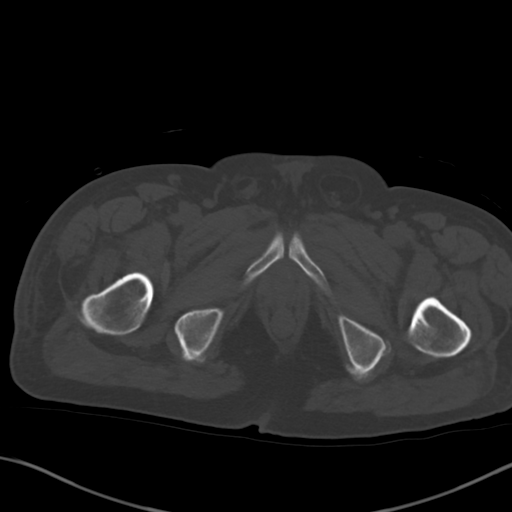
[im 11/89  soft-tissue]
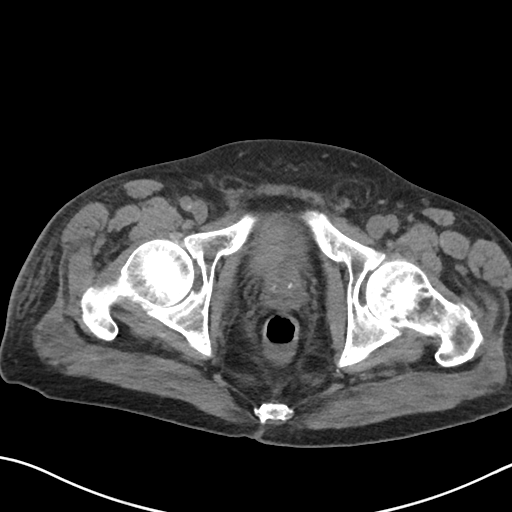
[im 17/89  soft-tissue]
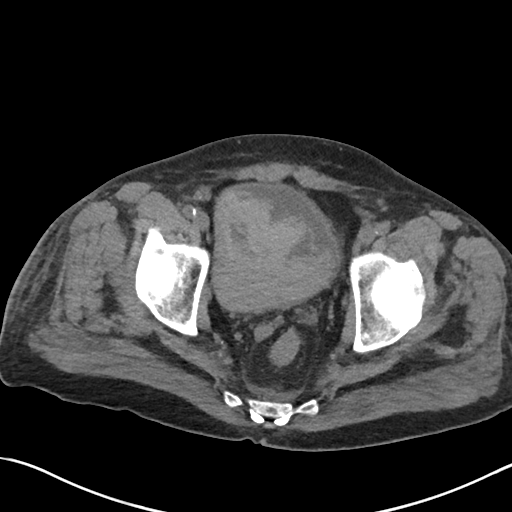
[im 24/89  soft-tissue]
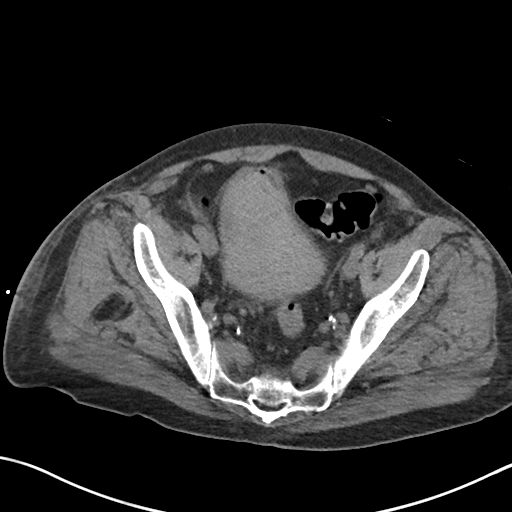
[im 31/89  soft-tissue]
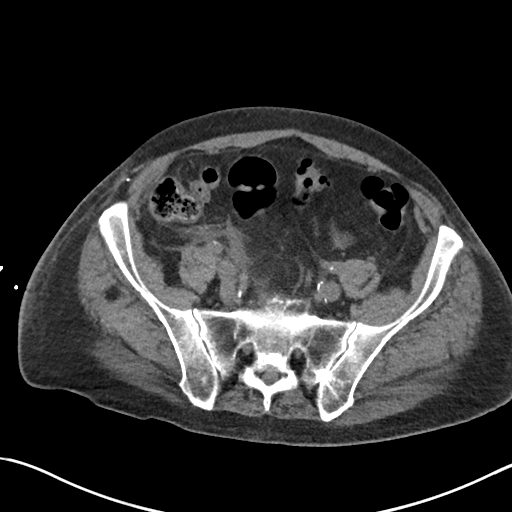
[im 38/89  soft-tissue]
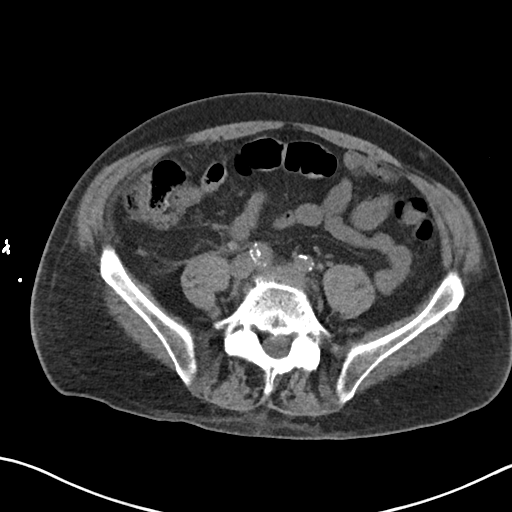
[im 45/89  soft-tissue]
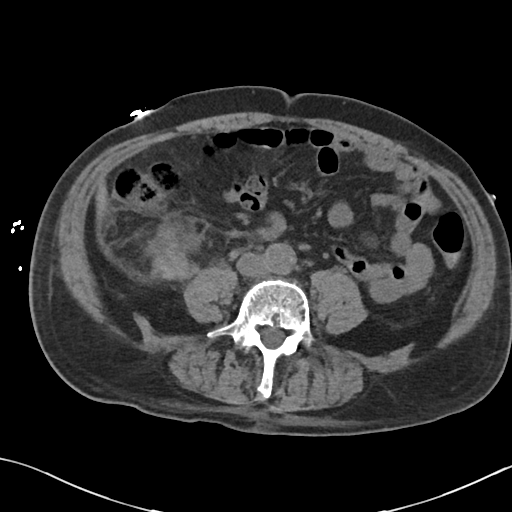
[im 51/89  soft-tissue]
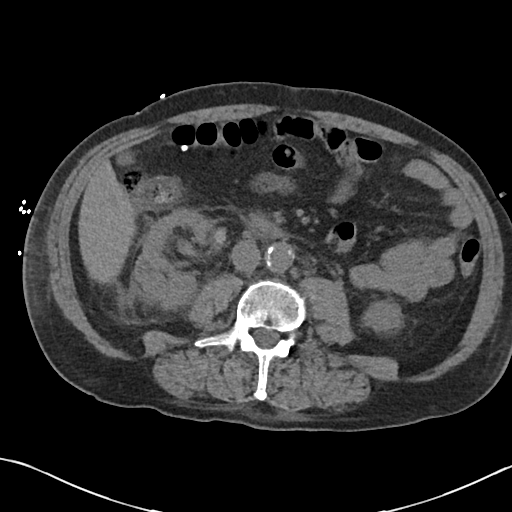
[im 58/89  soft-tissue]
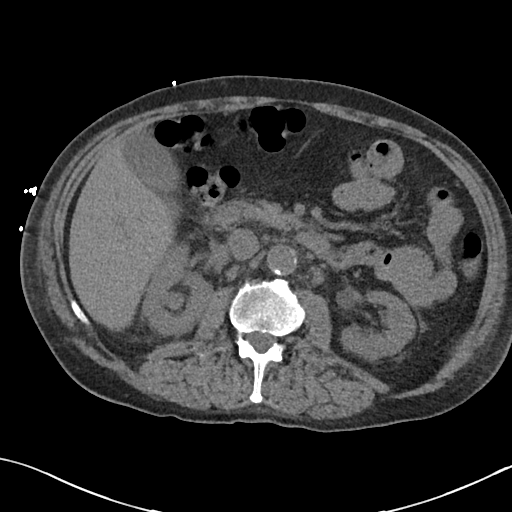
[im 58/89  bone]
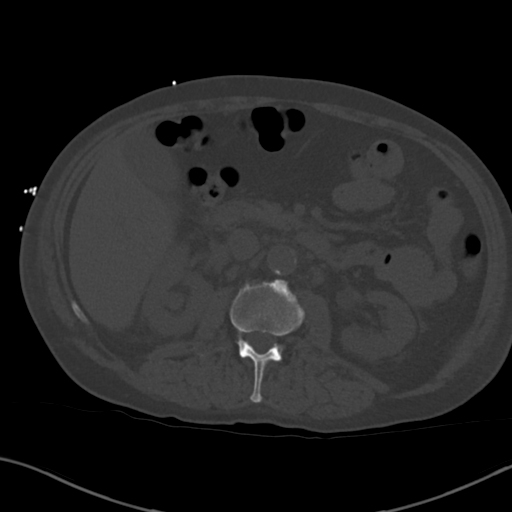
[im 65/89  soft-tissue]
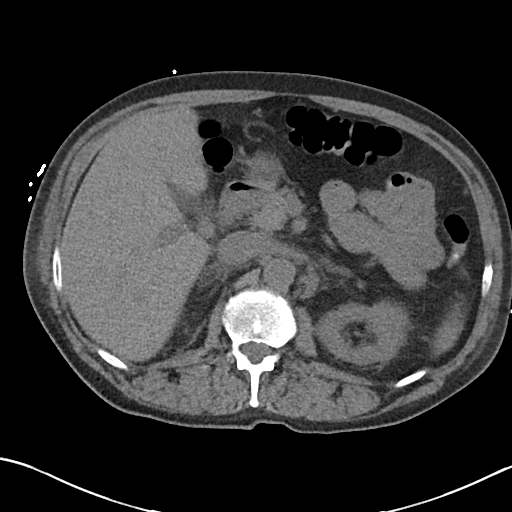
[im 72/89  soft-tissue]
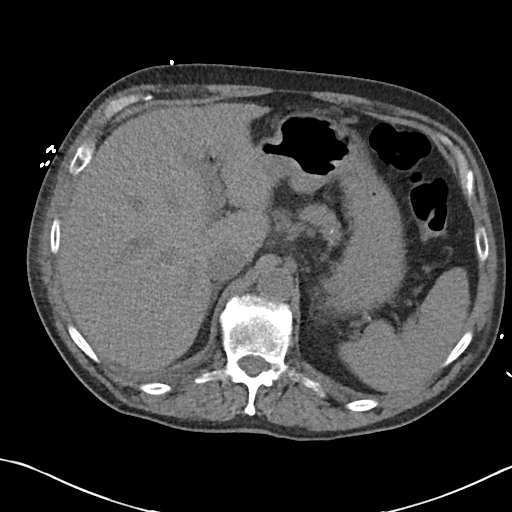
[im 78/89  soft-tissue]
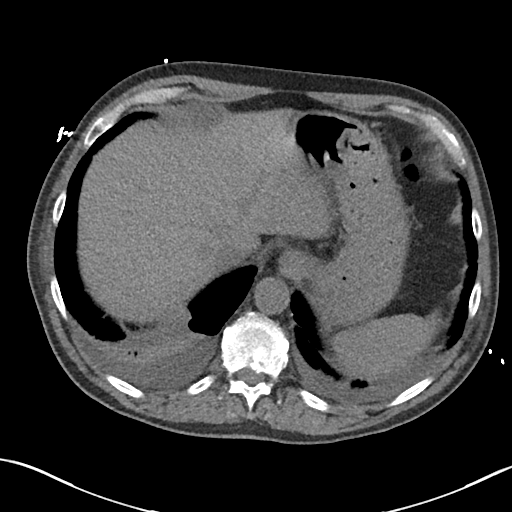
[im 85/89  soft-tissue]
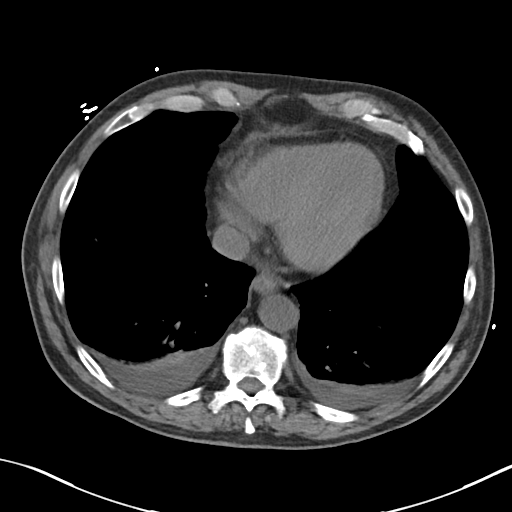

[Series 7: cor st · coronal · 0.81mm/px · 3 of 101 slices shown]
[im 34/101  soft-tissue]
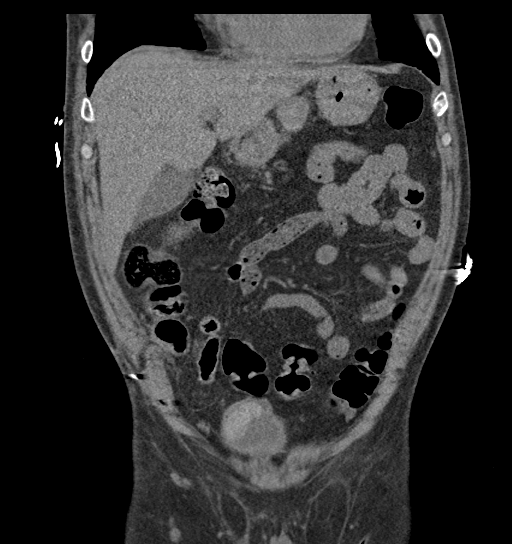
[im 45/101  soft-tissue]
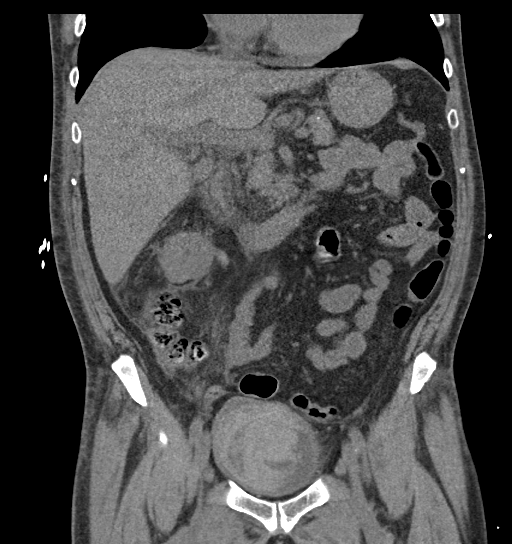
[im 56/101  soft-tissue]
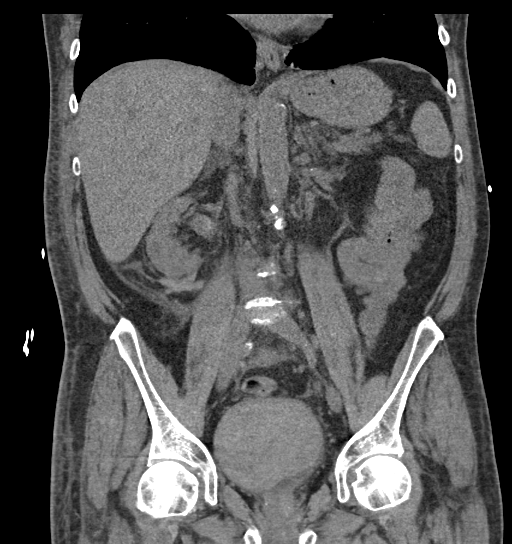

[16 of 46 positions shown; findings below may reference images not displayed]

FINDINGS: Inferior chest: Trace bilateral pleural effusions with right greater
than left compressive subsegmental atelectasis.

Hepatobiliary: The liver is normal in size without focal
abnormality. No intrahepatic or extrahepatic biliary ductal
dilation. The gallbladder appears normal.

Spleen: Normal in size without focal abnormality.

Pancreas: No pancreatic ductal dilatation or surrounding
inflammatory changes.

Adrenals/Urinary Tract: Adrenal glands are unremarkable. The kidneys
are normal in size. Tiny perinephric hematoma along the right renal
lower pole is unchanged. Hyperdense material in the right collecting
system and bladder consistent with blood products, grossly similar.

Stomach/Bowel: The stomach, small bowel and large bowel are normal
in caliber without abnormal wall thickening or surrounding
inflammatory changes.

Reproductive: Prostate is unremarkable.

Lymphatic: No enlarged lymph nodes in the abdomen or pelvis.

Vasculature: The abdominal aorta is normal in caliber. Aortic
atherosclerosis.

Other: No abdominopelvic ascites.

Musculoskeletal: No aggressive osseous lesions. Degenerative changes
at L5-S1. Bone island in the left ilium.
IMPRESSION: The small perinephric hematoma along the right renal lower pole is
stable, and within expected limits after percutaneous biopsy.
However, there remains substantial clot burden within the bladder.
Follow-up urology recommendations for management.

## 2021-08-30 MED ORDER — SODIUM CHLORIDE 0.9% IV SOLUTION: Freq: Once | INTRAVENOUS | Status: AC

## 2021-08-30 MED ORDER — DIPHENHYDRAMINE HCL 50 MG/ML IJ SOLN
25.0000 mg | Freq: Once | INTRAMUSCULAR | Status: AC
  Administered 2021-08-30: 25 mg via INTRAVENOUS
  Filled 2021-08-30: qty 1

## 2021-08-30 MED ORDER — LIDOCAINE HCL 1 % IJ SOLN
INTRAMUSCULAR | Status: DC | PRN
  Administered 2021-08-30: 15 mL via INTRADERMAL

## 2021-08-30 MED ORDER — MIDAZOLAM HCL 2 MG/2ML IJ SOLN
INTRAMUSCULAR | Status: DC | PRN
  Administered 2021-08-30: 2 mg via INTRAVENOUS

## 2021-08-30 MED ORDER — IOHEXOL 350 MG/ML SOLN
100.0000 mL | Freq: Once | INTRAVENOUS | Status: AC | PRN
  Administered 2021-08-30: 50 mL via INTRA_ARTERIAL

## 2021-08-30 MED ORDER — MIDAZOLAM HCL 2 MG/2ML IJ SOLN
INTRAMUSCULAR | Status: DC | PRN
  Administered 2021-08-30: 1 mg via INTRAVENOUS

## 2021-08-30 MED ORDER — MIDAZOLAM HCL 2 MG/2ML IJ SOLN
INTRAMUSCULAR | Status: AC
  Filled 2021-08-30: qty 2

## 2021-08-30 MED ORDER — HEPARIN SODIUM (PORCINE) 1000 UNIT/ML IJ SOLN
INTRAMUSCULAR | Status: DC | PRN
  Administered 2021-08-30: 3000 [IU] via INTRAVENOUS

## 2021-08-30 MED ORDER — ACETAMINOPHEN 325 MG PO TABS
650.0000 mg | ORAL_TABLET | Freq: Once | ORAL | Status: AC
  Administered 2021-08-30: 650 mg via ORAL
  Filled 2021-08-30: qty 2

## 2021-08-30 MED ORDER — HEPARIN SODIUM (PORCINE) 1000 UNIT/ML IJ SOLN
INTRAMUSCULAR | Status: AC
  Filled 2021-08-30: qty 1

## 2021-08-30 MED ORDER — DIPHENHYDRAMINE HCL 50 MG/ML IJ SOLN
INTRAMUSCULAR | Status: DC | PRN
  Administered 2021-08-30: 50 mg via INTRAVENOUS

## 2021-08-30 MED ORDER — IOHEXOL 350 MG/ML SOLN
100.0000 mL | Freq: Once | INTRAVENOUS | Status: AC | PRN
  Administered 2021-08-30: 20 mL via INTRA_ARTERIAL

## 2021-08-30 MED ORDER — DIPHENHYDRAMINE HCL 50 MG/ML IJ SOLN
INTRAMUSCULAR | Status: AC
  Filled 2021-08-30: qty 1

## 2021-08-30 MED ORDER — LIDOCAINE HCL 1 % IJ SOLN
INTRAMUSCULAR | Status: AC
  Filled 2021-08-30: qty 20

## 2021-08-30 NOTE — Progress Notes (Signed)
Patient transferred to IR for renal angiogram, catheterization, and RIJ catheter placement. Report called to 6E. Belongings sent home with son.

## 2021-08-30 NOTE — TOC Initial Note (Signed)
Transition of Care [facility]) - Initial/Assessment Note    Patient Details  Name: Caleb Simmons MRN: [id number] Date of Birth: May 17, 1951  [facility]) CM/SW Contact:    Caleb Carmine, RN Phone Number: 08/30/2021, 9:16 AM  Clinical Narrative:                  Caleb Simmons is a 70 year old patient admitted with AKI. A renal biopsy was done on 9/6 resulting in post Bx bleeding, requiring transfusion.CBI for urinary retention.  His sample was sent to pathology at [facility], Result pending.  He lives alone and has a son to call on. He will likely need Home Health, as he may continue to need a foley. Attempted to call room to speak to patient.nono answer.  CM will follow for needs , recommendations, and transitions of care.  Expected Discharge Plan: [place] Barriers to Discharge: Continued Medical Work up   Patient Goals and CMS Choice        Expected Discharge Plan and Services Expected Discharge Plan: [place]   Discharge Planning Services: CM Consult   Living arrangements for the past 2 months: Single Family Home, Mobile Home                                      Prior Living Arrangements/Services Living arrangements for the past 2 months: Single Family Home, Mobile Home Lives with:: Self Patient language and need for interpreter reviewed:: Yes        Need for Family Participation in Patient Care: Yes (Comment) Care giver support system in place?: Yes (comment)   Criminal Activity/Legal Involvement Pertinent to Current Situation/Hospitalization: No - Comment as needed  Activities of Daily Living Home Assistive Devices/Equipment: None ADL Screening (condition at time of admission) Patient's cognitive ability adequate to safely complete daily activities?: Yes Is the patient deaf or have difficulty hearing?: Yes Does the patient have difficulty seeing, even when wearing glasses/contacts?: No Does the patient have  difficulty concentrating, remembering, or making decisions?: No Patient able to express need for assistance with ADLs?: No Does the patient have difficulty dressing or bathing?: No Independently performs ADLs?: Yes (appropriate for developmental age) Does the patient have difficulty walking or climbing stairs?: Yes Weakness of Legs: None Weakness of Arms/Hands: None  Permission Sought/Granted                  Emotional Assessment       Orientation: : Oriented to Self, Oriented to Place, Oriented to  Time, Oriented to Situation Alcohol / Substance Use: Not Applicable Psych Involvement: No (comment)  Admission diagnosis:  Elevated troponin [R77.8] AKI (acute kidney injury) ([place]) [N17.9] Symptomatic anemia [D64.9] Patient Active Problem List   Diagnosis Date Noted   AKI (acute kidney injury) ([place]) 08/23/2021   PCP:  Caleb Simmons Pharmacy:   [facility] - [place], [place] - [address] [place] [address] Phone: [phone] Fax: [phone]     Social Determinants of Health (SDOH) Interventions    Readmission Risk Interventions No flowsheet data found.

## 2021-08-30 NOTE — Sedation Documentation (Signed)
Patient transported to 6 E. Groin site assessed. Clean , dry and intact. No hematoma noted upon palpation. Distal DP pulses intact +2

## 2021-08-30 NOTE — Sedation Documentation (Signed)
Co2 detector off at this time

## 2021-08-30 NOTE — Progress Notes (Addendum)
Pt is s/p right random renal bx on 07/25/2918 complicated by hypotension and near syncopal episode.  F/U CT on 9/7 showed tiny perinephric hematoma and bleeding into the right renal collecting system including moderate to large sided clot within the urinary bladderand associated mild right-sided pelviectasis and ureterectasis.  Patient was seen at bedside yesterday, he appeared to be hemodynamically stable; however, cbc this morning showed dropping in hgb, from 7.4 to 6.6. Vital stable.   Discussed with Dr. Serafina Royals, STAT CT abd pelvis w/o ordered to re-evaluate perinephric hematoma.  Per Dr. Serafina Royals, he has low threshold for possible mesenteric angiogram with possible embolization as pt azotemic, unlikely that the bleeding will stop spontaneously.   Will attempt minimize contrast use to prevent kidney injury IF angiogram is indicated, but the benefit of contrast use may outweigh the risk of acute kidney injury.   IR will follow.   Armando Gang Adamary Savary PA-C 08/30/2021 11:32 AM

## 2021-08-30 NOTE — Sedation Documentation (Signed)
Renal arteriogram procedure started

## 2021-08-30 NOTE — Progress Notes (Signed)
Chief Complaint: Patient was seen in consultation today for renal angio/embo  Supervising Physician: Ruthann Cancer  Patient Status: [facility] - In-pt  History of Present Illness: Caleb Simmons is a 70 y.o. male s/p right renal biopsy on 9/16. Developed hypotension and hematuria after. Have been following conservatively and pt actually feeling better. CBI was discontinued bu urology though sounds was still having some mild hematuria. Hgb dropped today and repeat CT was ordered. Imaging reviewed with Dr. Serafina Royals and is concerning for ongoing bleeding with possible pseudoaneurysm development. Feel angiogram with probably embolization is necessary at this point.   History reviewed. No pertinent past medical history.  Past Surgical History:  Procedure Laterality Date   APPENDECTOMY      Allergies: Patient has no known allergies.  Medications:  Current Facility-Administered Medications:    0.9 %  sodium chloride infusion, 10 mL/hr, Intravenous, Once, Carmin Muskrat, MD   0.9 %  sodium chloride infusion, , Intravenous, Continuous, Ghimire, Henreitta Leber, MD, Last Rate: 10 mL/hr at 08/27/21 2147, New Bag at 08/27/21 2147   acetaminophen (TYLENOL) tablet 650 mg, 650 mg, Oral, Q6H PRN, Jonetta Osgood, MD, 650 mg at 08/27/21 2147   ALPRAZolam (XANAX) tablet 0.25 mg, 0.25 mg, Oral, TID PRN, Jonetta Osgood, MD, 0.25 mg at 08/28/21 0005   bisacodyl (DULCOLAX) EC tablet 5 mg, 5 mg, Oral, Daily PRN, Caren Griffins, MD, 5 mg at 08/25/21 3086   Chlorhexidine Gluconate Cloth 2 % PADS 6 each, 6 each, Topical, Daily, Ghimire, Henreitta Leber, MD, 6 each at 08/30/21 1047   cyanocobalamin ((VITAMIN B-12)) injection 1,000 mcg, 1,000 mcg, Subcutaneous, Q2000, Ghimire, Henreitta Leber, MD, 1,000 mcg at 57/84/69 6295   folic acid (FOLVITE) tablet 2 mg, 2 mg, Oral, Daily, Ghimire, Shanker M, MD, 2 mg at 08/30/21 0855   HYDROmorphone (DILAUDID) injection 0.5 mg, 0.5 mg, Intravenous, Q4H PRN, Ghimire, Shanker M, MD,  0.5 mg at 08/30/21 1304   lactated ringers infusion, , Intravenous, Continuous, Ghimire, Henreitta Leber, MD, Last Rate: 75 mL/hr at 08/30/21 0950, New Bag at 08/30/21 0950   melatonin tablet 5 mg, 5 mg, Oral, QHS, Ghimire, Shanker M, MD, 5 mg at 08/29/21 2106   nicotine (NICODERM CQ - dosed in mg/24 hours) patch 14 mg, 14 mg, Transdermal, Q24H, Ghimire, Shanker M, MD, 14 mg at 08/30/21 0902   ondansetron (ZOFRAN) tablet 4 mg, 4 mg, Oral, Q6H PRN **OR** ondansetron (ZOFRAN) injection 4 mg, 4 mg, Intravenous, Q6H PRN, Cruzita Lederer, Costin M, MD, 4 mg at 08/28/21 2005   pantoprazole (PROTONIX) EC tablet 40 mg, 40 mg, Oral, BID AC, Gherghe, Costin M, MD, 40 mg at 08/30/21 0855   promethazine (PHENERGAN) 6.25 MG/5ML syrup 6.25 mg, 6.25 mg, Oral, Q4H PRN, Cruzita Lederer, Costin M, MD   sodium bicarbonate tablet 1,300 mg, 1,300 mg, Oral, TID, Gean Quint, MD, 1,300 mg at 08/30/21 0856   [CANCELED] Continuous Bladder Irrigation, , , Until Discontinued **AND** sodium chloride irrigation 0.9 % 3,000 mL, 3,000 mL, Irrigation, Continuous, Ghimire, Shanker M, MD, 3,000 mL at 08/29/21 1640   tamsulosin (FLOMAX) capsule 0.4 mg, 0.4 mg, Oral, Daily, Ghimire, Shanker M, MD, 0.4 mg at 08/30/21 0855   technetium albumin aggregated (MAA) injection solution 4.2 millicurie, 4.2 millicurie, Intravenous, Once PRN, Felipa Emory, MD   traZODone (DESYREL) tablet 50 mg, 50 mg, Oral, QHS PRN, Jonetta Osgood, MD, 50 mg at 08/29/21 2111    History reviewed. No pertinent family history.  Social History   Socioeconomic History  Marital status: Married    Spouse name: Not on file   Number of children: Not on file   Years of education: Not on file   Highest education level: Not on file  Occupational History   Not on file  Tobacco Use   Smoking status: Some Days    Packs/day: 0.25    Years: 25.00    Pack years: 6.25    Types: Cigarettes   Smokeless tobacco: Never  Substance and Sexual Activity   Alcohol use: Not on file    Drug use: Not on file   Sexual activity: Not on file  Other Topics Concern   Not on file  Social History Narrative   Not on file   Social Determinants of Health   Financial Resource Strain: Not on file  Food Insecurity: Not on file  Transportation Needs: Not on file  Physical Activity: Not on file  Stress: Not on file  Social Connections: Not on file     Review of Systems: A 12 point ROS discussed and pertinent positives are indicated in the HPI above.  All other systems are negative.  Review of Systems  Vital Signs: BP 135/77 (BP Location: Right Arm)   Pulse 80   Temp 99.2 F (37.3 C) (Oral)   Resp 16   Ht 5\' 10"  (1.778 m)   Wt 81.6 kg   SpO2 95%   BMI 25.83 kg/m   Physical Exam Constitutional:      Appearance: Normal appearance.  HENT:     Mouth/Throat:     Mouth: Mucous membranes are moist.     Pharynx: Oropharynx is clear.  Cardiovascular:     Rate and Rhythm: Normal rate and regular rhythm.     Pulses: Normal pulses.     Heart sounds: Normal heart sounds.  Pulmonary:     Effort: Pulmonary effort is normal. No respiratory distress.     Breath sounds: Normal breath sounds.  Abdominal:     General: There is no distension.     Palpations: Abdomen is soft. There is no mass.     Tenderness: There is no abdominal tenderness.  Skin:    General: Skin is warm and dry.  Neurological:     General: No focal deficit present.     Mental Status: He is alert and oriented to person, place, and time.  Psychiatric:        Mood and Affect: Mood normal.        Thought Content: Thought content normal.    Imaging: CT ABDOMEN PELVIS WO CONTRAST  Result Date: 08/30/2021 CLINICAL DATA:  Retroperitoneal hematoma, follow up s/p random renal bx with perinephric hematoma development EXAM: CT ABDOMEN AND PELVIS WITHOUT CONTRAST TECHNIQUE: Multidetector CT imaging of the abdomen and pelvis was performed following the standard protocol without IV contrast. COMPARISON:  September 6  FINDINGS: Inferior chest: Trace bilateral pleural effusions with right greater than left compressive subsegmental atelectasis. Hepatobiliary: The liver is normal in size without focal abnormality. No intrahepatic or extrahepatic biliary ductal dilation. The gallbladder appears normal. Spleen: Normal in size without focal abnormality. Pancreas: No pancreatic ductal dilatation or surrounding inflammatory changes. Adrenals/Urinary Tract: Adrenal glands are unremarkable. The kidneys are normal in size. Tiny perinephric hematoma along the right renal lower pole is unchanged. Hyperdense material in the right collecting system and bladder consistent with blood products, grossly similar. Stomach/Bowel: The stomach, small bowel and large bowel are normal in caliber without abnormal wall thickening or surrounding inflammatory changes. Reproductive: Prostate  is unremarkable. Lymphatic: No enlarged lymph nodes in the abdomen or pelvis. Vasculature: The abdominal aorta is normal in caliber. Aortic atherosclerosis. Other: No abdominopelvic ascites. Musculoskeletal: No aggressive osseous lesions. Degenerative changes at L5-S1. Bone island in the left ilium. IMPRESSION: The small perinephric hematoma along the right renal lower pole is stable, and within expected limits after percutaneous biopsy. However, there remains substantial clot burden within the bladder. Follow-up urology recommendations for management. Electronically Signed   By: Albin Felling M.D.   On: 08/30/2021 14:21   CT ABDOMEN PELVIS WO CONTRAST  Result Date: 08/29/2021 CLINICAL DATA:  Post right-sided renal biopsy, now with hematuria and hypotension. EXAM: CT ABDOMEN AND PELVIS WITHOUT CONTRAST TECHNIQUE: Multidetector CT imaging of the abdomen and pelvis was performed following the standard protocol without IV contrast. COMPARISON:  CT abdomen pelvis-08/23/2021; ultrasound-guided right renal biopsy-earlier same day FINDINGS: The lack of intravenous contrast  limits the ability to evaluate solid abdominal organs. Lower chest: Limited visualization of the lower thorax demonstrates interval development of trace bilateral effusion with worsening bibasilar heterogeneous/consolidative opacities, right greater than left. Previously identified nonspecific ground-glass opacities within the imaged lung bases is not seen on the present examination though there is mild residual intraseptal thickening. Normal heart size. Trace amount of pericardial fluid, unchanged presumably physiologic. There is diffuse decreased attenuation intra cardiac blood pool suggestive of anemia. Hepatobiliary: Normal hepatic contour. Apparent high density material within the gallbladder could represent biliary sludge. No definitive gallbladder wall thickening or pericholecystic stranding on this noncontrast examination. No ascites. Pancreas: Normal noncontrast appearance of the pancreas. Spleen: Normal noncontrast appearance of the spleen. Adrenals/Urinary Tract: There is a very tiny (approximately 2.7 x 1.6 x 1.2 cm) perinephric hematoma about the inferior pole of the right kidney (axial image 44, series 3; coronal image 57, series 6), with minimal amount of adjacent perinephric stranding. High-density material is seen within the right renal collecting system and ureter with moderate to large amount of layering high-density material within urinary bladder, findings compatible with hemorrhage into the collecting system and bladder. Mild associated right-sided pelviectasis and ureterectasis. Normal noncontrast appearance of the left kidney. No evidence of left-sided nephrolithiasis or urinary obstruction. Normal noncontrast appearance of the bilateral adrenal glands. Stomach/Bowel: Scattered minimal colonic diverticulosis without evidence of superimposed acute diverticulitis on this noncontrast examination. Normal appearance of the terminal ileum. The appendix is not visualized compatible with provided  operative history. No discrete areas of bowel wall thickening on this noncontrast examination. No pneumoperitoneum, pneumatosis or portal venous gas. Vascular/Lymphatic: Moderate amount of atherosclerotic plaque within normal caliber abdominal aorta. Scattered retroperitoneal lymph nodes are numerous though individually not enlarged by size criteria with index left sided periaortic lymph node measuring 0.7 cm in greatest short axis diameter (image 31, series 3), presumably reactive in etiology. No bulky retroperitoneal, mesenteric, pelvic or inguinal lymphadenopathy on this noncontrast examination Reproductive: Dystrophic calcifications within normal sized prostate gland. Trace amount of fluid within the pelvic cul-de-sac. Other: Small bilateral mesenteric fat containing inguinal hernias, left greater than right. Minimal amount of subcutaneous edema about the midline of the low back. Presumed shrapnel is seen within the right lower abdominal/pelvic ventral abdominal wall. Musculoskeletal: No acute or aggressive osseous abnormalities. Mild-to-moderate multilevel lumbar spine DDD, worse at L4-L5 and L5-S1 with disc space height loss, endplate irregularity and small posteriorly directed disc osteophyte complexes at these locations. Mild degenerative change the bilateral hips with joint space loss, subchondral sclerosis and osteophytosis, right greater than left. IMPRESSION: 1. Post right-sided renal biopsy  complicated by tiny (approximately 2.7 cm) perinephric hematoma and bleeding into the right renal collecting system including moderate to large-sized clot within the urinary bladder and associated mild right-sided pelviectasis and ureterectasis. Consideration for initiation of continuous bladder irrigation could be performed as indicated. 2. Trace bilateral effusions with associated bibasilar opacities, right greater than left, likely atelectasis. 3. Colonic diverticulosis without evidence superimposed acute  diverticulitis. 4.  Aortic Atherosclerosis (ICD10-I70.0). Critical Value/emergent results were called by telephone at the time of interpretation on 08/28/2021 at 5:04 pm to provider [facility] , who verbally acknowledged these results. Electronically Signed   By: Sandi Mariscal M.D.   On: 08/29/2021 10:38   DG Chest 2 View  Result Date: 08/23/2021 CLINICAL DATA:  Near syncope. EXAM: CHEST - 2 VIEW COMPARISON:  None. FINDINGS: The lungs are clear without focal pneumonia, edema, pneumothorax or pleural effusion. Cardiopericardial silhouette is at upper limits of normal for size. The visualized bony structures of the thorax show no acute abnormality. Telemetry leads overlie the chest. IMPRESSION: No active cardiopulmonary disease. Electronically Signed   By: Misty Stanley M.D.   On: 08/23/2021 09:15   NM Pulmonary Perfusion  Result Date: 08/23/2021 CLINICAL DATA:  PE suspected, shortness of breath EXAM: NUCLEAR MEDICINE PERFUSION LUNG SCAN TECHNIQUE: Perfusion images were obtained in multiple projections after intravenous injection of radiopharmaceutical. Ventilation scans intentionally deferred if perfusion scan and chest x-ray adequate for interpretation during COVID 19 epidemic. RADIOPHARMACEUTICALS:  4.2 mCi Tc-58m MAA IV COMPARISON:  Same-day chest radiographs FINDINGS: Normal, homogeneous pulmonary perfusion. No suspicious filling defects. IMPRESSION: Very low probability examination for pulmonary embolism by modified perfusion only PIOPED criteria (PE absent). Electronically Signed   By: Eddie Candle M.D.   On: 08/23/2021 15:59   ECHOCARDIOGRAM COMPLETE  Result Date: 08/24/2021    ECHOCARDIOGRAM REPORT   Patient Name:   MESHILEM MACHUCA Date of Exam: 08/24/2021 Medical Rec #:  [id number]    Height:       70.0 in Accession #:    [id number]   Weight:       180.0 lb Date of Birth:  12/29/1950     BSA:          1.996 m Patient Age:    34 years     BP:           120/69 mmHg Patient Gender: M            HR:            85 bpm. Exam Location:  Inpatient Procedure: 2D Echo, Cardiac Doppler and Color Doppler Indications:    Abnormal EKG  History:        Patient has no prior history of Echocardiogram examinations.                 COPD, Signs/Symptoms:Dyspnea and Weakness, renal disease,                 anemia, elevated troponin; Risk Factors:Current Smoker.  Sonographer:    Dustin Flock RDCS Referring Phys: [place]  Sonographer Comments: Image acquisition challenging due to COPD. IMPRESSIONS  1. Left ventricular ejection fraction, by estimation, is 55 to 60%. The left ventricle has normal function. The left ventricle has no regional wall motion abnormalities. There is mild concentric left ventricular hypertrophy. Left ventricular diastolic parameters are consistent with Grade I diastolic dysfunction (impaired relaxation).  2. Right ventricular systolic function is normal. The right ventricular size is normal. Tricuspid regurgitation signal is inadequate  for assessing PA pressure.  3. The mitral valve is grossly normal. No evidence of mitral valve regurgitation. No evidence of mitral stenosis.  4. The aortic valve was not well visualized. Aortic valve regurgitation is not visualized. No aortic stenosis is present.  5. The inferior vena cava is normal in size with greater than 50% respiratory variability, suggesting right atrial pressure of 3 mmHg. Comparison(s): No prior Echocardiogram. FINDINGS  Left Ventricle: Left ventricular ejection fraction, by estimation, is 55 to 60%. The left ventricle has normal function. The left ventricle has no regional wall motion abnormalities. The left ventricular internal cavity size was normal in size. There is  mild concentric left ventricular hypertrophy. Left ventricular diastolic parameters are consistent with Grade I diastolic dysfunction (impaired relaxation). Right Ventricle: The right ventricular size is normal. No increase in right ventricular wall thickness. Right  ventricular systolic function is normal. Tricuspid regurgitation signal is inadequate for assessing PA pressure. Left Atrium: Left atrial size was normal in size. Right Atrium: Right atrial size was normal in size. Pericardium: There is no evidence of pericardial effusion. Mitral Valve: The mitral valve is grossly normal. No evidence of mitral valve regurgitation. No evidence of mitral valve stenosis. Tricuspid Valve: The tricuspid valve is normal in structure. Tricuspid valve regurgitation is not demonstrated. No evidence of tricuspid stenosis. Aortic Valve: The aortic valve was not well visualized. Aortic valve regurgitation is not visualized. No aortic stenosis is present. Pulmonic Valve: The pulmonic valve was not well visualized. Pulmonic valve regurgitation is not visualized. Aorta: The aortic root is normal in size and structure. Venous: The inferior vena cava is normal in size with greater than 50% respiratory variability, suggesting right atrial pressure of 3 mmHg. IAS/Shunts: The atrial septum is grossly normal.  LEFT VENTRICLE PLAX 2D LVIDd:         5.80 cm      Diastology LVIDs:         3.60 cm      LV e' medial:    5.55 cm/s LV PW:         1.30 cm      LV E/e' medial:  14.5 LV IVS:        1.30 cm      LV e' lateral:   6.74 cm/s LVOT diam:     2.40 cm      LV E/e' lateral: 12.0 LV SV:         76 LV SV Index:   38 LVOT Area:     4.52 cm  LV Volumes (MOD) LV vol d, MOD A4C: 124.0 ml LV vol s, MOD A4C: 47.8 ml LV SV MOD A4C:     124.0 ml RIGHT VENTRICLE RV Basal diam:  2.80 cm RV S prime:     10.10 cm/s TAPSE (M-mode): 2.9 cm LEFT ATRIUM             Index       RIGHT ATRIUM           Index LA diam:        3.80 cm 1.90 cm/m  RA Area:     13.10 cm LA Vol (A2C):   31.8 ml 15.93 ml/m RA Volume:   29.70 ml  14.88 ml/m LA Vol (A4C):   33.0 ml 16.53 ml/m LA Biplane Vol: 34.9 ml 17.49 ml/m  AORTIC VALVE LVOT Vmax:   98.30 cm/s LVOT Vmean:  60.300 cm/s LVOT VTI:    0.169 m  AORTA Ao Root diam: 3.30  cm MITRAL  VALVE MV Area (PHT): 3.85 cm    SHUNTS MV Decel Time: 197 msec    Systemic VTI:  0.17 m MV E velocity: 80.60 cm/s  Systemic Diam: 2.40 cm MV A velocity: 49.30 cm/s MV E/A ratio:  1.63 Rudean Haskell MD Electronically signed by Rudean Haskell MD Signature Date/Time: 08/24/2021/11:38:40 AM    Final    CT Renal Stone Study  Result Date: 08/23/2021 CLINICAL DATA:  Hematuria, new renal failure EXAM: CT ABDOMEN AND PELVIS WITHOUT CONTRAST TECHNIQUE: Multidetector CT imaging of the abdomen and pelvis was performed following the standard protocol without IV contrast. COMPARISON:  06/26/2007 FINDINGS: Lower chest: There is mild, scattered, nonspecific ground-glass and fine nodularity throughout the included bilateral lung bases (series 5, 4). Hepatobiliary: No solid liver abnormality is seen. No gallstones, gallbladder wall thickening, or biliary dilatation. Pancreas: Unremarkable. No pancreatic ductal dilatation or surrounding inflammatory changes. Spleen: Normal in size without significant abnormality. Adrenals/Urinary Tract: Adrenal glands are unremarkable. Kidneys are normal, without renal calculi, solid lesion, or hydronephrosis. Distended urinary bladder, measuring at least 17.4 cm. Stomach/Bowel: Stomach is within normal limits. Appendix not clearly visualized and may be surgically. No evidence of bowel wall thickening, distention, or inflammatory changes. Descending and sigmoid diverticulosis. Vascular/Lymphatic: Aortic atherosclerosis. No enlarged abdominal or pelvic lymph nodes. Reproductive: No mass or other significant abnormality. Other: Small, fat containing bilateral inguinal hernias no abdominopelvic ascites. Musculoskeletal: No acute or significant osseous findings. IMPRESSION: 1. No evidence of urinary tract calculus or hydronephrosis. 2. Distended urinary bladder, measuring at least 17.4 cm. Correlate for urinary retention. 3. Descending and sigmoid diverticulosis without evidence of acute  diverticulitis. 4. Mild, nonspecific scattered ground-glass and fine nodularity throughout the bilateral lung bases, possibly infectious or inflammatory. Aortic Atherosclerosis (ICD10-I70.0). Electronically Signed   By: Eddie Candle M.D.   On: 08/23/2021 16:04   US BIOPSY (KIDNEY)  Result Date: 08/28/2021 INDICATION: Acute kidney injury of uncertain etiology. Please perform image guided biopsy for tissue diagnostic purposes. EXAM: ULTRASOUND GUIDED RENAL BIOPSY COMPARISON:  CT abdomen and pelvis-08/23/2021 MEDICATIONS: None. ANESTHESIA/SEDATION: Fentanyl 100 mcg IV; Versed 2 mg IV Total Moderate Sedation time: 14 minutes; The patient was continuously monitored during the procedure by the interventional radiology nurse under my direct supervision. COMPLICATIONS: None immediate. PROCEDURE: Informed written consent was obtained from the patient after a discussion of the risks, benefits and alternatives to treatment. The patient understands and consents the procedure. A timeout was performed prior to the initiation of the procedure. Ultrasound scanning was performed of the bilateral flanks. The inferior pole of the right kidney was selected for biopsy due to location and sonographic window. The procedure was planned. The operative site was prepped and draped in the usual sterile fashion. The overlying soft tissues were anesthetized with 1% lidocaine with epinephrine. A 17 gauge core needle biopsy device was advanced into the inferior cortex of the right kidney and 3 core biopsies were obtained under direct ultrasound guidance. Images were saved for documentation purposes. The biopsy device was removed and hemostasis was obtained with manual compression. Post procedural scanning was negative for significant post procedural hemorrhage or additional complication. A dressing was placed. The patient tolerated the procedure well without immediate post procedural complication. IMPRESSION: Technically successful ultrasound  guided right renal biopsy. Electronically Signed   By: Sandi Mariscal M.D.   On: 08/28/2021 12:52    Labs:  CBC: Recent Labs    08/28/21 1840 08/29/21 0040 08/29/21 0637 08/30/21 0241  WBC 14.5* 13.1* 12.3* 12.8*  HGB 8.4* 7.1* 7.4* 6.4*  HCT 26.5* 22.4* 24.1* 20.8*  PLT 298 246 236 239    COAGS: Recent Labs    08/24/21 1128 08/28/21 0305 08/29/21 0637 08/30/21 0241  INR 1.6* 1.5* 1.7* 1.5*    BMP: Recent Labs    08/27/21 0029 08/28/21 0305 08/29/21 0040 08/30/21 0241  NA 139 138 137 136  K 3.8 3.6 4.1 3.8  CL 111 109 109 104  CO2 21* 21* 20* 23  GLUCOSE 92 93 113* 86  BUN 35* 33* 34* 35*  CALCIUM 8.2* 8.3* 8.1* 8.3*  CREATININE 5.09* 5.03* 5.06* 5.56*  GFRNONAA 11* 12* 11* 10*    LIVER FUNCTION TESTS: Recent Labs    08/23/21 1037 08/24/21 0055 08/25/21 0758 08/26/21 1036 08/27/21 0029 08/28/21 0305 08/29/21 0040 08/30/21 0241  BILITOT 0.3 0.4  --  0.8  --   --   --   --   AST 17 14*  --  14*  --   --   --   --   ALT 10 10  --  9  --   --   --   --   ALKPHOS 66 57  --  56  --   --   --   --   PROT 5.6* 4.8*  --  5.0*  --   --   --   --   ALBUMIN 2.2* 1.9*   < > 1.9* 1.8* 1.8* 1.6* 1.6*   < > = values in this interval not displayed.    TUMOR MARKERS: No results for input(s): AFPTM, CEA, CA199, CHROMGRNA in the last 8760 hours.  Assessment and Plan: Post right renal biopsy hemorrhage with evidence of ongoing bleeding. Feel at this point, bleeding likely slow, but not likely to cease on its own. Discussed with pt need for angiogram and probable embolization. Discussed with Nephrology that contrast load from angiogram as well as partial embolization may worsen renal function and they agree, he may likely need dialysis post procedure and wish for IR to place temp HD cath. This was discussed with the pt who is agreeable. Risks and benefits of renal angiogram/embo were discussed with the patient including, but not limited to bleeding, infection, vascular  injury or contrast induced renal failure.  This interventional procedure involves the use of X-rays and because of the nature of the planned procedure, it is possible that we will have prolonged use of X-ray fluoroscopy.  Potential radiation risks to you include (but are not limited to) the following: - A slightly elevated risk for cancer  several years later in life. This risk is typically less than 0.5% percent. This risk is low in comparison to the normal incidence of human cancer, which is 33% for women and 50% for men according to the [employer]. - Radiation induced injury can include skin redness, resembling a rash, tissue breakdown / ulcers and hair loss (which can be temporary or permanent).   The likelihood of either of these occurring depends on the difficulty of the procedure and whether you are sensitive to radiation due to previous procedures, disease, or genetic conditions.   IF your procedure requires a prolonged use of radiation, you will be notified and given written instructions for further action.  It is your responsibility to monitor the irradiated area for the 2 weeks following the procedure and to notify your physician if you are concerned that you have suffered a radiation induced injury.    All of the patient's  questions were answered, patient is agreeable to proceed.  Consent signed and in chart.    Thank you for this interesting consult.  I greatly enjoyed meeting [facility] and look forward to participating in their care.  A copy of this report was sent to the requesting provider on this date.  Electronically Signed: Ascencion Dike, PA-C 08/30/2021, 2:54 PM   I spent a total of 20 minutes in face to face in clinical consultation, greater than 50% of which was counseling/coordinating care for renal angio and HD cath placement

## 2021-08-30 NOTE — Progress Notes (Signed)
PROGRESS NOTE        PATIENT DETAILS Name: Caleb Simmons Age: 70 y.o. Sex: male Date of Birth: July 03, 1951 Admit Date: 08/23/2021 Admitting Physician Costin Karlyne Greenspan, MD PCP:Pcp, No  Brief Narrative: Patient is a 70 y.o. male with history of nephrolithiasis-presenting with generalized weakness/shortness of breath-found to have AKI and severe normocytic anemia.  Patient's renal function improved with supportive care-he was found to have a glomerular pathology causing AKI-underwent renal biopsy on 9/6-unfortunately developed bleeding into the collecting system postbiopsy-causing acute urinary retention/clot retention requiring three-way Foley catheter insertion and CBI.    Significant events: 9/1>> admit for evaluation of AKI/severe anemia. 9/6>> ultrasound-guided biopsy of right kidney 9/6>> developed hematuria followed by acute urinary retention-presyncope and hypotension post kidney biopsy.  Three-way Foley catheter placed and CBI started.  Significant studies: 9/1>> CXR: No pneumonia 9/1>> VQ scan: No PE 9/1>> CT renal stone study: No hydronephrosis, distended bladder.  Diverticulosis. 9/1>> FOBT: Negative 9/1>> UA: Protein>> 300, RBCs 11-20/hpf 9/1>> HIV: Nonreactive 9/1>> HBsAg/HCV Ab: Nonreactive 9/1>> C3/C4: Normal limits 9/1>> glomerular basement Ab: Negative 9/1>> ANA: Negative 9/1>> dsDNA: Negative 9/1>> ANCA titers: Negative 9/1>> SPEP: no M  spike 9/2>> Echo: EF 70-35%, grade 1 diastolic dysfunction. 9/4>>Folic acid :0.0(XFG) 1/8>>EXH [id number]:696 9/6>> CT abdomen/pelvis: Tiny perinephric hematoma, bleeding into the right renal collecting system   Antimicrobial therapy: None  Microbiology data: 9/1>> COVID PCR: Negative  Procedures : 9/06>> ultrasound-guided biopsy of right kidney  Consults: Nephrology, IR, urology  DVT Prophylaxis : Place and maintain sequential compression device Start: 08/24/21 0519   Subjective: Some pain from  bladder spasms.  Drop in hemoglobin overnight-urine again is bloody in Foley.  Assessment/Plan: Acute kidney injury: Renal function has gradually improved-creatinine has stabilized around renal function has gradually improved-but creatinine seems to have plateaued around 5.  Extensive serological work-up negative.  S/p renal biopsy on 9/6-awaiting official report.  Nephrology following.  Intake/Output Summary (Last 24 hours) at 08/30/2021 1404 Last data filed at 08/30/2021 1130 Gross per 24 hour  Intake 6523.15 ml  Output 9700 ml  Net -3176.85 ml     Hypotension/presyncope-hematuria-acute urinary retention (clot retention) due to bleeding into the collecting system of the right kidney post renal biopsy on 9/6 : Three-way Foley catheter in place-urine bloody today.  IR planning repeat CT and possible embolization.  Will await further recommendations.  Urology following as well.    Microcytic anemia: Multifactorial anemia-from AKI/acute illness/vitamin B12 and folate deficiency-but lately due to blood loss post kidney biopsy.  Drop in hemoglobin overnight-transfusing 1 unit of PRBC today.  Follow posttransfusion CBC.  Continue B12/iron/folate supplementation.    Minimally elevated troponins: Trend is flat-not consistent with ACS-he does not have any chest pain.  Exertional dyspnea was from symptomatic anemia.  Suspect elevated troponin is in the setting of kidney failure.  Echo without any wall motion malady and preserved EF.  Doubt any further work-up is required-especially in light of severity of anemia and AKI.  Anxiety: Continue Xanax while he is in the hospital-is aware that we will likely not continue Xanax post discharge.  Use trazodone for sleep.  Patient is not interested in starting long-acting medications like SSRI for his anxiety-thinks that once he gets home-he will not require any further medications for his anxiety issues.  Tobacco abuse: Continue transdermal nicotine. Diet: Diet  Order  Diet regular Room service appropriate? No; Fluid consistency: Thin  Diet effective now                    Code Status: Full code   Family Communication: Daughter-in-law at bedside on 9/7-none at bedside on 9/8.  Disposition Plan: Status is: Inpatient  Remains inpatient appropriate because:Inpatient level of care appropriate due to severity of illness  Dispo: The patient is from: Home              Anticipated d/c is to: Home              Patient currently is not medically stable to d/c.   Difficult to place patient No     Barriers to Discharge: Severe AKI-renal biopsy-with bleeding-and acute urinary retention requiring three-way Foley catheter with continuous bladder irrigation.  Antimicrobial agents: Anti-infectives (From admission, onward)    None        Time spent: 35 minutes-Greater than 50% of this time was spent in counseling, explanation of diagnosis, planning of further management, and coordination of care.  MEDICATIONS: Scheduled Meds:  Chlorhexidine Gluconate Cloth  6 each Topical Daily   cyanocobalamin  1,000 mcg Subcutaneous [id number]   folic acid  2 mg Oral Daily   melatonin  5 mg Oral QHS   nicotine  14 mg Transdermal Q24H   pantoprazole  40 mg Oral BID AC   sodium bicarbonate  1,300 mg Oral TID   tamsulosin  0.4 mg Oral Daily   Continuous Infusions:  sodium chloride     sodium chloride 10 mL/hr at 08/27/21 2147   lactated ringers 75 mL/hr at 08/30/21 0950   sodium chloride irrigation     PRN Meds:.acetaminophen, ALPRAZolam, bisacodyl, HYDROmorphone (DILAUDID) injection, ondansetron **OR** ondansetron (ZOFRAN) IV, promethazine, technetium albumin aggregated, traZODone   PHYSICAL EXAM: Vital signs: Vitals:   08/30/21 0846 08/30/21 0905 08/30/21 1130 08/30/21 1220  BP: 110/65 127/72 130/80 135/77  Pulse: 86 85 80 80  Resp: (!) 22 20 20 16   Temp: 97.9 F (36.6 C) 97.6 F (36.4 C) 98.3 F (36.8 C) 99.2 F (37.3 C)   TempSrc: Oral  Oral Oral  SpO2: 98% 93%  95%  Weight:      Height:       Filed Weights   08/23/21 0816  Weight: 81.6 kg   Body mass index is 25.83 kg/m.   Gen Exam:Alert awake-not in any distress HEENT:atraumatic, normocephalic Chest: B/L clear to auscultation anteriorly CVS:S1S2 regular Abdomen:soft non tender, non distended Extremities:no edema Neurology: Non focal Skin: no rash   I have personally reviewed following labs and imaging studies  LABORATORY DATA: CBC: Recent Labs  Lab 08/28/21 0305 08/28/21 1617 08/28/21 1840 08/29/21 0040 08/29/21 0637 08/30/21 0241  WBC 9.3 12.3* 14.5* 13.1* 12.3* 12.8*  NEUTROABS 5.8  --   --   --   --   --   HGB 8.4* 8.5* 8.4* 7.1* 7.4* 6.4*  HCT 26.8* 26.3* 26.5* 22.4* 24.1* 20.8*  MCV 82.2 82.4 82.6 83.6 84.6 85.2  PLT 263 265 298 246 236 239     Basic Metabolic Panel: Recent Labs  Lab 08/24/21 0055 08/25/21 0037 08/25/21 0758 08/26/21 0006 08/27/21 0029 08/28/21 0305 08/29/21 0040 08/30/21 0241  NA 137  --    < > 137 139 138 137 136  K 4.5  --    < > 4.0 3.8 3.6 4.1 3.8  CL 110  --    < > 111 111 109  109 104  CO2 16*  --    < > 19* 21* 21* 20* 23  GLUCOSE 93  --    < > 106* 92 93 113* 86  BUN 41*  --    < > 40* 35* 33* 34* 35*  CREATININE 6.12*  --    < > 5.52* 5.09* 5.03* 5.06* 5.56*  CALCIUM 7.7*  --    < > 7.8* 8.2* 8.3* 8.1* 8.3*  MG 1.5* 2.1  --   --   --   --   --   --   PHOS 5.6*  --    < > 4.3 4.3 4.7* 6.5* 5.2*   < > = values in this interval not displayed.     GFR: Estimated Creatinine Clearance: 12.6 mL/min (A) (by C-G formula based on SCr of 5.56 mg/dL (H)).  Liver Function Tests: Recent Labs  Lab 08/24/21 0055 08/25/21 0758 08/26/21 1036 08/27/21 0029 08/28/21 0305 08/29/21 0040 08/30/21 0241  AST 14*  --  14*  --   --   --   --   ALT 10  --  9  --   --   --   --   ALKPHOS 57  --  56  --   --   --   --   BILITOT 0.4  --  0.8  --   --   --   --   PROT 4.8*  --  5.0*  --   --   --    --   ALBUMIN 1.9*   < > 1.9* 1.8* 1.8* 1.6* 1.6*   < > = values in this interval not displayed.    No results for input(s): LIPASE, AMYLASE in the last 168 hours.  No results for input(s): AMMONIA in the last 168 hours.  Coagulation Profile: Recent Labs  Lab 08/24/21 1128 08/28/21 0305 08/29/21 0637 08/30/21 0241  INR 1.6* 1.5* 1.7* 1.5*     Cardiac Enzymes: No results for input(s): CKTOTAL, CKMB, CKMBINDEX, TROPONINI in the last 168 hours.  BNP (last 3 results) No results for input(s): PROBNP in the last 8760 hours.  Lipid Profile: No results for input(s): CHOL, HDL, LDLCALC, TRIG, CHOLHDL, LDLDIRECT in the last 72 hours.  Thyroid Function Tests: No results for input(s): TSH, T4TOTAL, FREET4, T3FREE, THYROIDAB in the last 72 hours.  Anemia Panel: No results for input(s): VITAMINB12, FOLATE, FERRITIN, TIBC, IRON, RETICCTPCT in the last 72 hours.   Urine analysis:    Component Value Date/Time   COLORURINE STRAW (A) 08/23/2021 1140   APPEARANCEUR CLEAR 08/23/2021 1140   LABSPEC 1.008 08/23/2021 1140   PHURINE 6.0 08/23/2021 1140   GLUCOSEU 50 (A) 08/23/2021 1140   HGBUR SMALL (A) 08/23/2021 1140   BILIRUBINUR NEGATIVE 08/23/2021 1140   KETONESUR NEGATIVE 08/23/2021 1140   PROTEINUR >=300 (A) 08/23/2021 1140   UROBILINOGEN 0.2 06/26/2007 1023   NITRITE NEGATIVE 08/23/2021 1140   LEUKOCYTESUR NEGATIVE 08/23/2021 1140    Sepsis Labs: Lactic Acid, Venous No results found for: LATICACIDVEN  MICROBIOLOGY: Recent Results (from the past 240 hour(s))  SARS CORONAVIRUS 2 (TAT 6-24 HRS) Nasopharyngeal Nasopharyngeal Swab     Status: None   Collection Time: 08/23/21  5:11 PM   Specimen: Nasopharyngeal Swab  Result Value Ref Range Status   SARS Coronavirus 2 NEGATIVE NEGATIVE Final    Comment: (NOTE) SARS-CoV-2 target nucleic acids are NOT DETECTED.  The SARS-CoV-2 RNA is generally detectable in upper and lower respiratory specimens during the acute  phase of  infection. Negative results do not preclude SARS-CoV-2 infection, do not rule out co-infections with other pathogens, and should not be used as the sole basis for treatment or other patient management decisions. Negative results must be combined with clinical observations, patient history, and epidemiological information. The expected result is Negative.  Fact Sheet for Patients: SugarRoll.be  Fact Sheet for Healthcare Providers: https://www.woods-mathews.com/  This test is not yet approved or cleared by the [place] FDA and  has been authorized for detection and/or diagnosis of SARS-CoV-2 by FDA under an Emergency Use Authorization (EUA). This EUA will remain  in effect (meaning this test can be used) for the duration of the COVID-19 declaration under Se ction 564(b)(1) of the Act, 21 U.S.C. section 360bbb-3(b)(1), unless the authorization is terminated or revoked sooner.  Performed at [facility], [place] [address] [place], [place] [address]     RADIOLOGY STUDIES/RESULTS: CT ABDOMEN PELVIS WO CONTRAST  Result Date: 08/29/2021 CLINICAL DATA:  Post right-sided renal biopsy, now with hematuria and hypotension. EXAM: CT ABDOMEN AND PELVIS WITHOUT CONTRAST TECHNIQUE: Multidetector CT imaging of the abdomen and pelvis was performed following the standard protocol without IV contrast. COMPARISON:  CT abdomen pelvis-08/23/2021; ultrasound-guided right renal biopsy-earlier same day FINDINGS: The lack of intravenous contrast limits the ability to evaluate solid abdominal organs. Lower chest: Limited visualization of the lower thorax demonstrates interval development of trace bilateral effusion with worsening bibasilar heterogeneous/consolidative opacities, right greater than left. Previously identified nonspecific ground-glass opacities within the imaged lung bases is not seen on the present examination though there is mild residual intraseptal  thickening. Normal heart size. Trace amount of pericardial fluid, unchanged presumably physiologic. There is diffuse decreased attenuation intra cardiac blood pool suggestive of anemia. Hepatobiliary: Normal hepatic contour. Apparent high density material within the gallbladder could represent biliary sludge. No definitive gallbladder wall thickening or pericholecystic stranding on this noncontrast examination. No ascites. Pancreas: Normal noncontrast appearance of the pancreas. Spleen: Normal noncontrast appearance of the spleen. Adrenals/Urinary Tract: There is a very tiny (approximately 2.7 x 1.6 x 1.2 cm) perinephric hematoma about the inferior pole of the right kidney (axial image 44, series 3; coronal image 57, series 6), with minimal amount of adjacent perinephric stranding. High-density material is seen within the right renal collecting system and ureter with moderate to large amount of layering high-density material within urinary bladder, findings compatible with hemorrhage into the collecting system and bladder. Mild associated right-sided pelviectasis and ureterectasis. Normal noncontrast appearance of the left kidney. No evidence of left-sided nephrolithiasis or urinary obstruction. Normal noncontrast appearance of the bilateral adrenal glands. Stomach/Bowel: Scattered minimal colonic diverticulosis without evidence of superimposed acute diverticulitis on this noncontrast examination. Normal appearance of the terminal ileum. The appendix is not visualized compatible with provided operative history. No discrete areas of bowel wall thickening on this noncontrast examination. No pneumoperitoneum, pneumatosis or portal venous gas. Vascular/Lymphatic: Moderate amount of atherosclerotic plaque within normal caliber abdominal aorta. Scattered retroperitoneal lymph nodes are numerous though individually not enlarged by size criteria with index left sided periaortic lymph node measuring 0.7 cm in greatest short  axis diameter (image 31, series 3), presumably reactive in etiology. No bulky retroperitoneal, mesenteric, pelvic or inguinal lymphadenopathy on this noncontrast examination Reproductive: Dystrophic calcifications within normal sized prostate gland. Trace amount of fluid within the pelvic cul-de-sac. Other: Small bilateral mesenteric fat containing inguinal hernias, left greater than right. Minimal amount of subcutaneous edema about the midline of the low back. Presumed shrapnel is  seen within the right lower abdominal/pelvic ventral abdominal wall. Musculoskeletal: No acute or aggressive osseous abnormalities. Mild-to-moderate multilevel lumbar spine DDD, worse at L4-L5 and L5-S1 with disc space height loss, endplate irregularity and small posteriorly directed disc osteophyte complexes at these locations. Mild degenerative change the bilateral hips with joint space loss, subchondral sclerosis and osteophytosis, right greater than left. IMPRESSION: 1. Post right-sided renal biopsy complicated by tiny (approximately 2.7 cm) perinephric hematoma and bleeding into the right renal collecting system including moderate to large-sized clot within the urinary bladder and associated mild right-sided pelviectasis and ureterectasis. Consideration for initiation of continuous bladder irrigation could be performed as indicated. 2. Trace bilateral effusions with associated bibasilar opacities, right greater than left, likely atelectasis. 3. Colonic diverticulosis without evidence superimposed acute diverticulitis. 4.  Aortic Atherosclerosis (ICD10-I70.0). Critical Value/emergent results were called by telephone at the time of interpretation on 08/28/2021 at 5:04 pm to provider [facility] , who verbally acknowledged these results. Electronically Signed   By: Sandi Mariscal M.D.   On: 08/29/2021 10:38     LOS: 7 days   Oren Binet, MD  [facility]    To contact the attending provider between 7A-7P or the  covering provider during after hours 7P-7A, please log into the web site [email or web address] and access using universal [place] password for that web site. If you do not have the password, please call the hospital operator.  08/30/2021, 2:04 PM

## 2021-08-30 NOTE — Progress Notes (Signed)
[facility] NEPHROLOGY PROGRESS NOTE  Assessment/ Plan:  #Acute kidney injury, nonoliguric, with history of excessive NSAID's use/BC powders every day.  Peaked creatinine level of 6.95.  CT scan without obstruction or hydronephrosis.  UA with more than 300 protein and microscopic hematuria.  Serology evaluation including ANA, ANCA, C3, C4, SPEP, anti-GBM, double-stranded DNA, hep B, hep C, HIV negative.   He underwent IR guided kidney biopsy on 9/6 complicated by postbiopsy bleeding.  Seen by urologist and currently undergoing CBI.   Creatinine level trending up to 5.5 today likely hemodynamic changes and severe anemia.  Getting gentle IV hydration.  No uremic symptoms and no plan for dialysis.  Continue to monitor closely. Recommend daily lab, strict ins and out.  #Anemia, complicated by acute blood loss post kidney biopsy: Urology and IR is following.  Currently undergoing CBI.  Plan for a unit of blood transfusion today.  May need embolization of bleeding blood vessel, defer this to IR and urology.  FOBT negative.  Treated with IV iron.  #History of nephrolithiasis: Repeat CT scan without any obstruction or hydronephrosis.  Urology is following  #Metabolic acidosis: Continue oral sodium bicarbonate, monitor CO2 level.  Subjective: Seen and examined.  Currently undergoing CBI with pinkish urine.  Noted hemoglobin dropping.  Clinically looks stable and denies nausea, vomiting, chest pain, shortness of breath. Objective Vital signs in last 24 hours: Vitals:   08/30/21 0402 08/30/21 0725 08/30/21 0846 08/30/21 0905  BP: (!) 90/40 113/70 110/65 127/72  Pulse: 67 81 86 85  Resp: 17 12 (!) 22 20  Temp: 98.1 F (36.7 C) 98.7 F (37.1 C) 97.9 F (36.6 C) 97.6 F (36.4 C)  TempSrc: Axillary Oral Oral   SpO2: 94% 94% 98% 93%  Weight:      Height:       Weight change:   Intake/Output Summary (Last 24 hours) at 08/30/2021 0955 Last data filed at 08/30/2021 0445 Gross per 24  hour  Intake 12769.84 ml  Output 13400 ml  Net -630.16 ml        Labs: Basic Metabolic Panel: Recent Labs  Lab 08/28/21 0305 08/29/21 0040 08/30/21 0241  NA 138 137 136  K 3.6 4.1 3.8  CL 109 109 104  CO2 21* 20* 23  GLUCOSE 93 113* 86  BUN 33* 34* 35*  CREATININE 5.03* 5.06* 5.56*  CALCIUM 8.3* 8.1* 8.3*  PHOS 4.7* 6.5* 5.2*    Liver Function Tests: Recent Labs  Lab 08/23/21 1037 08/24/21 0055 08/25/21 0758 08/26/21 1036 08/27/21 0029 08/28/21 0305 08/29/21 0040 08/30/21 0241  AST 17 14*  --  14*  --   --   --   --   ALT 10 10  --  9  --   --   --   --   ALKPHOS 66 57  --  56  --   --   --   --   BILITOT 0.3 0.4  --  0.8  --   --   --   --   PROT 5.6* 4.8*  --  5.0*  --   --   --   --   ALBUMIN 2.2* 1.9*   < > 1.9*   < > 1.8* 1.6* 1.6*   < > = values in this interval not displayed.    Recent Labs  Lab 08/23/21 1037  LIPASE 70*    No results for input(s): AMMONIA in the last 168 hours. CBC: Recent Labs  Lab 08/28/21 0305  08/28/21 1617 08/28/21 1840 08/29/21 0040 08/29/21 0637 08/30/21 0241  WBC 9.3 12.3* 14.5* 13.1* 12.3* 12.8*  NEUTROABS 5.8  --   --   --   --   --   HGB 8.4* 8.5* 8.4* 7.1* 7.4* 6.4*  HCT 26.8* 26.3* 26.5* 22.4* 24.1* 20.8*  MCV 82.2 82.4 82.6 83.6 84.6 85.2  PLT 263 265 298 246 236 239    Cardiac Enzymes: No results for input(s): CKTOTAL, CKMB, CKMBINDEX, TROPONINI in the last 168 hours. CBG: No results for input(s): GLUCAP in the last 168 hours.  Iron Studies: No results for input(s): IRON, TIBC, TRANSFERRIN, FERRITIN in the last 72 hours. Studies/Results: CT ABDOMEN PELVIS WO CONTRAST  Result Date: 08/29/2021 CLINICAL DATA:  Post right-sided renal biopsy, now with hematuria and hypotension. EXAM: CT ABDOMEN AND PELVIS WITHOUT CONTRAST TECHNIQUE: Multidetector CT imaging of the abdomen and pelvis was performed following the standard protocol without IV contrast. COMPARISON:  CT abdomen pelvis-08/23/2021;  ultrasound-guided right renal biopsy-earlier same day FINDINGS: The lack of intravenous contrast limits the ability to evaluate solid abdominal organs. Lower chest: Limited visualization of the lower thorax demonstrates interval development of trace bilateral effusion with worsening bibasilar heterogeneous/consolidative opacities, right greater than left. Previously identified nonspecific ground-glass opacities within the imaged lung bases is not seen on the present examination though there is mild residual intraseptal thickening. Normal heart size. Trace amount of pericardial fluid, unchanged presumably physiologic. There is diffuse decreased attenuation intra cardiac blood pool suggestive of anemia. Hepatobiliary: Normal hepatic contour. Apparent high density material within the gallbladder could represent biliary sludge. No definitive gallbladder wall thickening or pericholecystic stranding on this noncontrast examination. No ascites. Pancreas: Normal noncontrast appearance of the pancreas. Spleen: Normal noncontrast appearance of the spleen. Adrenals/Urinary Tract: There is a very tiny (approximately 2.7 x 1.6 x 1.2 cm) perinephric hematoma about the inferior pole of the right kidney (axial image 44, series 3; coronal image 57, series 6), with minimal amount of adjacent perinephric stranding. High-density material is seen within the right renal collecting system and ureter with moderate to large amount of layering high-density material within urinary bladder, findings compatible with hemorrhage into the collecting system and bladder. Mild associated right-sided pelviectasis and ureterectasis. Normal noncontrast appearance of the left kidney. No evidence of left-sided nephrolithiasis or urinary obstruction. Normal noncontrast appearance of the bilateral adrenal glands. Stomach/Bowel: Scattered minimal colonic diverticulosis without evidence of superimposed acute diverticulitis on this noncontrast examination.  Normal appearance of the terminal ileum. The appendix is not visualized compatible with provided operative history. No discrete areas of bowel wall thickening on this noncontrast examination. No pneumoperitoneum, pneumatosis or portal venous gas. Vascular/Lymphatic: Moderate amount of atherosclerotic plaque within normal caliber abdominal aorta. Scattered retroperitoneal lymph nodes are numerous though individually not enlarged by size criteria with index left sided periaortic lymph node measuring 0.7 cm in greatest short axis diameter (image 31, series 3), presumably reactive in etiology. No bulky retroperitoneal, mesenteric, pelvic or inguinal lymphadenopathy on this noncontrast examination Reproductive: Dystrophic calcifications within normal sized prostate gland. Trace amount of fluid within the pelvic cul-de-sac. Other: Small bilateral mesenteric fat containing inguinal hernias, left greater than right. Minimal amount of subcutaneous edema about the midline of the low back. Presumed shrapnel is seen within the right lower abdominal/pelvic ventral abdominal wall. Musculoskeletal: No acute or aggressive osseous abnormalities. Mild-to-moderate multilevel lumbar spine DDD, worse at L4-L5 and L5-S1 with disc space height loss, endplate irregularity and small posteriorly directed disc osteophyte complexes at these locations. Mild  degenerative change the bilateral hips with joint space loss, subchondral sclerosis and osteophytosis, right greater than left. IMPRESSION: 1. Post right-sided renal biopsy complicated by tiny (approximately 2.7 cm) perinephric hematoma and bleeding into the right renal collecting system including moderate to large-sized clot within the urinary bladder and associated mild right-sided pelviectasis and ureterectasis. Consideration for initiation of continuous bladder irrigation could be performed as indicated. 2. Trace bilateral effusions with associated bibasilar opacities, right greater than  left, likely atelectasis. 3. Colonic diverticulosis without evidence superimposed acute diverticulitis. 4.  Aortic Atherosclerosis (ICD10-I70.0). Critical Value/emergent results were called by telephone at the time of interpretation on 08/28/2021 at 5:04 pm to provider [facility] , who verbally acknowledged these results. Electronically Signed   By: Sandi Mariscal M.D.   On: 08/29/2021 10:38   US BIOPSY (KIDNEY)  Result Date: 08/28/2021 INDICATION: Acute kidney injury of uncertain etiology. Please perform image guided biopsy for tissue diagnostic purposes. EXAM: ULTRASOUND GUIDED RENAL BIOPSY COMPARISON:  CT abdomen and pelvis-08/23/2021 MEDICATIONS: None. ANESTHESIA/SEDATION: Fentanyl 100 mcg IV; Versed 2 mg IV Total Moderate Sedation time: 14 minutes; The patient was continuously monitored during the procedure by the interventional radiology nurse under my direct supervision. COMPLICATIONS: None immediate. PROCEDURE: Informed written consent was obtained from the patient after a discussion of the risks, benefits and alternatives to treatment. The patient understands and consents the procedure. A timeout was performed prior to the initiation of the procedure. Ultrasound scanning was performed of the bilateral flanks. The inferior pole of the right kidney was selected for biopsy due to location and sonographic window. The procedure was planned. The operative site was prepped and draped in the usual sterile fashion. The overlying soft tissues were anesthetized with 1% lidocaine with epinephrine. A 17 gauge core needle biopsy device was advanced into the inferior cortex of the right kidney and 3 core biopsies were obtained under direct ultrasound guidance. Images were saved for documentation purposes. The biopsy device was removed and hemostasis was obtained with manual compression. Post procedural scanning was negative for significant post procedural hemorrhage or additional complication. A dressing was placed.  The patient tolerated the procedure well without immediate post procedural complication. IMPRESSION: Technically successful ultrasound guided right renal biopsy. Electronically Signed   By: Sandi Mariscal M.D.   On: 08/28/2021 12:52    Medications: Infusions:  sodium chloride     sodium chloride 10 mL/hr at 08/27/21 2147   lactated ringers 75 mL/hr at 08/30/21 0950   sodium chloride irrigation      Scheduled Medications:  Chlorhexidine Gluconate Cloth  6 each Topical Daily   cyanocobalamin  1,000 mcg Subcutaneous [id number]   folic acid  2 mg Oral Daily   melatonin  5 mg Oral QHS   nicotine  14 mg Transdermal Q24H   pantoprazole  40 mg Oral BID AC   sodium bicarbonate  1,300 mg Oral TID   tamsulosin  0.4 mg Oral Daily    have reviewed scheduled and prn medications.  Physical Exam: General: Not in distress, lying on bed comfortable Heart:RRR, s1s2 nl, no rubs Lungs: Clear bilateral, no wheezing or crackle Abdomen: Soft, nontender, nondistended Extremities:No LE edema Neurology: Alert, awake, following commands  Ladon Vandenberghe Prasad Fantasha Daniele 08/30/2021,9:55 AM  LOS: 7 days

## 2021-08-30 NOTE — Sedation Documentation (Signed)
Patient resting comfortably at this time.

## 2021-08-30 NOTE — Sedation Documentation (Signed)
No IV access at this time. Temp HD catheter to be placed, will use side port from catheter for sedation purposes

## 2021-08-30 NOTE — Procedures (Signed)
Interventional Radiology Procedure Note  Procedure:  1) Right IJ temporary hemodialysis catheter placement 2) Right renal angiogram 3) Selective catheterization and embolization of right inferior pole arcuate artery  Findings: Please refer to procedural dictation for full description. 24 cm Trialysis catheter placed via right IJ, ready for immediate use.  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  Successful coil embolization of these arcuate branches.   Complications: None immediate  Estimated Blood Loss: <5 mL  Recommendations: Strict 4 hour bedrest, 2 hours flat (until 20:15) followed by 2 hours head of bed up to 30 degrees (until 22:15). Continue to monitor H/H.  Expect stabilization. Expect some continued passage of clots/hematuria which should clear within the next 24-48 hours.  May require additional CBI to clear residual bladder clot as per Urology. IR will follow.   Ruthann Cancer, MD Pager: [phone]

## 2021-08-31 DIAGNOSIS — N179 Acute kidney failure, unspecified: Secondary | ICD-10-CM | POA: Diagnosis not present

## 2021-08-31 DIAGNOSIS — D649 Anemia, unspecified: Secondary | ICD-10-CM | POA: Diagnosis not present

## 2021-08-31 DIAGNOSIS — R778 Other specified abnormalities of plasma proteins: Secondary | ICD-10-CM | POA: Diagnosis not present

## 2021-08-31 HISTORY — PX: IR RENAL SELECTIVE  UNI INC S&I MOD SED: IMG654

## 2021-08-31 LAB — TYPE AND SCREEN
ABO/RH(D): A NEG
Antibody Screen: NEGATIVE
Unit division: 0

## 2021-08-31 LAB — RENAL FUNCTION PANEL
Albumin: 1.7 g/dL — ABNORMAL LOW (ref 3.5–5.0)
Anion gap: 10 (ref 5–15)
BUN: 34 mg/dL — ABNORMAL HIGH (ref 8–23)
CO2: 23 mmol/L (ref 22–32)
Calcium: 8.3 mg/dL — ABNORMAL LOW (ref 8.9–10.3)
Chloride: 106 mmol/L (ref 98–111)
Creatinine, Ser: 5.93 mg/dL — ABNORMAL HIGH (ref 0.61–1.24)
GFR, Estimated: 10 mL/min — ABNORMAL LOW (ref >60)
Glucose, Bld: 106 mg/dL — ABNORMAL HIGH (ref 70–99)
Phosphorus: 5.8 mg/dL — ABNORMAL HIGH (ref 2.5–4.6)
Potassium: 4.1 mmol/L (ref 3.5–5.1)
Sodium: 139 mmol/L (ref 135–145)

## 2021-08-31 LAB — CBC
HCT: 22.5 % — ABNORMAL LOW (ref 39.0–52.0)
HCT: 24.5 % — ABNORMAL LOW (ref 39.0–52.0)
Hemoglobin: 7.1 g/dL — ABNORMAL LOW (ref 13.0–17.0)
Hemoglobin: 7.6 g/dL — ABNORMAL LOW (ref 13.0–17.0)
MCH: 26.6 pg (ref 26.0–34.0)
MCH: 26.4 pg (ref 26.0–34.0)
MCHC: 31.6 g/dL (ref 30.0–36.0)
MCHC: 31 g/dL (ref 30.0–36.0)
MCV: 84.3 fL (ref 80.0–100.0)
MCV: 85.1 fL (ref 80.0–100.0)
Platelets: 226 10*3/uL (ref 150–400)
Platelets: 246 10*3/uL (ref 150–400)
RBC: 2.67 MIL/uL — ABNORMAL LOW (ref 4.22–5.81)
RBC: 2.88 MIL/uL — ABNORMAL LOW (ref 4.22–5.81)
RDW: 18.9 % — ABNORMAL HIGH (ref 11.5–15.5)
RDW: 18.7 % — ABNORMAL HIGH (ref 11.5–15.5)
WBC: 11.2 10*3/uL — ABNORMAL HIGH (ref 4.0–10.5)
WBC: 11.5 10*3/uL — ABNORMAL HIGH (ref 4.0–10.5)
nRBC: 0 % (ref 0.0–0.2)
nRBC: 0 % (ref 0.0–0.2)

## 2021-08-31 LAB — BPAM RBC
Blood Product Expiration Date: 202209272359
ISSUE DATE / TIME: 202209080840
Unit Type and Rh: 600

## 2021-08-31 MED ORDER — ALBUMIN HUMAN 25 % IV SOLN
25.0000 g | Freq: Four times a day (QID) | INTRAVENOUS | Status: AC
Start: 2021-08-31 — End: 2021-08-31
  Administered 2021-08-31 (×2): 25 g via INTRAVENOUS
  Filled 2021-08-31 (×2): qty 100

## 2021-08-31 NOTE — Progress Notes (Addendum)
[facility] NEPHROLOGY PROGRESS NOTE  Assessment/ Plan:  #Acute kidney injury, nonoliguric, with history of excessive NSAID's use/BC powders every day.  Peaked creatinine level of 6.95.  CT scan without obstruction or hydronephrosis.  UA with more than 300 protein and microscopic hematuria.  Serology evaluation including ANA, ANCA, C3, C4, SPEP, anti-GBM, double-stranded DNA, hep B, hep C, HIV negative.   He underwent IR guided kidney biopsy on 9/6 complicated by postbiopsy bleeding. Awaiting bx result, hopefully it will be back early next week.  The creatinine level is trending up to 5.93 probably because of hemodynamic changes and severe anemia.  Right IJ temporary HD placed on 9/8.  Fortunately the patient is nonoliguric with urine output of around 2.3 L.  He looks clinically stable without any uremic signs. No plan for dialysis today.  On gentle IV hydration and will add IV albumin to increase renal perfusion.  Recommend daily lab, strict ins and out.  #Post kidney biopsy bleeding, syncope/acute blood loss anemia: He initially received continuous bladder irrigation.  Because of ongoing bleeding, he underwent embolization of right inferior pole arcuate artery and right IJ temporary HD catheter placement by IR on 9/8.  Still having some hematuria, not on CBI at the moment, urology is following; defer to them.  #Anemia, complicated by acute blood loss post kidney biopsy: Urology and IR is following.  Received blood transfusion.  Monitor hemoglobin.  On admission FOBT was negative and treated with IV iron.  #History of nephrolithiasis: Repeat CT scan without any obstruction or hydronephrosis.  Urology is following  #Metabolic acidosis: Continue oral sodium bicarbonate, stable CO2 level.  Subjective: Seen and examined.  Event noted.  Underwent IR procedure.  He is a still having some hematuria.  He reports being hungry and asking for food and drink.  Urine output 2.3 L.  Denies  headache, dizziness, nausea, vomiting, anorexia, dysgeusia, chest pain, shortness of breath. Objective Vital signs in last 24 hours: Vitals:   08/30/21 1852 08/30/21 2030 08/30/21 2031 08/31/21 0005  BP: (!) 158/85 (!) 150/83 (!) 150/83 125/70  Pulse: 97  88 80  Resp: 17 11 14 19   Temp: 98.3 F (36.8 C)  98.2 F (36.8 C)   TempSrc: Oral  Oral   SpO2: 90% 94% 93% 98%  Weight:      Height:       Weight change:   Intake/Output Summary (Last 24 hours) at 08/31/2021 1026 Last data filed at 08/31/2021 1000 Gross per 24 hour  Intake 2694.87 ml  Output 1525 ml  Net 1169.87 ml        Labs: Basic Metabolic Panel: Recent Labs  Lab 08/29/21 0040 08/30/21 0241 08/31/21 0230  NA 137 136 139  K 4.1 3.8 4.1  CL 109 104 106  CO2 20* 23 23  GLUCOSE 113* 86 106*  BUN 34* 35* 34*  CREATININE 5.06* 5.56* 5.93*  CALCIUM 8.1* 8.3* 8.3*  PHOS 6.5* 5.2* 5.8*    Liver Function Tests: Recent Labs  Lab 08/26/21 1036 08/27/21 0029 08/29/21 0040 08/30/21 0241 08/31/21 0230  AST 14*  --   --   --   --   ALT 9  --   --   --   --   ALKPHOS 56  --   --   --   --   BILITOT 0.8  --   --   --   --   PROT 5.0*  --   --   --   --  ALBUMIN 1.9*   < > 1.6* 1.6* 1.7*   < > = values in this interval not displayed.    No results for input(s): LIPASE, AMYLASE in the last 168 hours.  No results for input(s): AMMONIA in the last 168 hours. CBC: Recent Labs  Lab 08/28/21 0305 08/28/21 1617 08/28/21 1840 08/29/21 0040 08/29/21 0637 08/30/21 0241 08/30/21 1556 08/31/21 0230  WBC 9.3   < > 14.5* 13.1* 12.3* 12.8*  --  11.2*  NEUTROABS 5.8  --   --   --   --   --   --   --   HGB 8.4*   < > 8.4* 7.1* 7.4* 6.4* 7.4* 7.1*  HCT 26.8*   < > 26.5* 22.4* 24.1* 20.8* 23.2* 22.5*  MCV 82.2   < > 82.6 83.6 84.6 85.2  --  84.3  PLT 263   < > 298 246 236 239  --  226   < > = values in this interval not displayed.    Cardiac Enzymes: No results for input(s): CKTOTAL, CKMB, CKMBINDEX, TROPONINI  in the last 168 hours. CBG: No results for input(s): GLUCAP in the last 168 hours.  Iron Studies: No results for input(s): IRON, TIBC, TRANSFERRIN, FERRITIN in the last 72 hours. Studies/Results: CT ABDOMEN PELVIS WO CONTRAST  Result Date: 08/30/2021 CLINICAL DATA:  Retroperitoneal hematoma, follow up s/p random renal bx with perinephric hematoma development EXAM: CT ABDOMEN AND PELVIS WITHOUT CONTRAST TECHNIQUE: Multidetector CT imaging of the abdomen and pelvis was performed following the standard protocol without IV contrast. COMPARISON:  September 6 FINDINGS: Inferior chest: Trace bilateral pleural effusions with right greater than left compressive subsegmental atelectasis. Hepatobiliary: The liver is normal in size without focal abnormality. No intrahepatic or extrahepatic biliary ductal dilation. The gallbladder appears normal. Spleen: Normal in size without focal abnormality. Pancreas: No pancreatic ductal dilatation or surrounding inflammatory changes. Adrenals/Urinary Tract: Adrenal glands are unremarkable. The kidneys are normal in size. Tiny perinephric hematoma along the right renal lower pole is unchanged. Hyperdense material in the right collecting system and bladder consistent with blood products, grossly similar. Stomach/Bowel: The stomach, small bowel and large bowel are normal in caliber without abnormal wall thickening or surrounding inflammatory changes. Reproductive: Prostate is unremarkable. Lymphatic: No enlarged lymph nodes in the abdomen or pelvis. Vasculature: The abdominal aorta is normal in caliber. Aortic atherosclerosis. Other: No abdominopelvic ascites. Musculoskeletal: No aggressive osseous lesions. Degenerative changes at L5-S1. Bone island in the left ilium. IMPRESSION: The small perinephric hematoma along the right renal lower pole is stable, and within expected limits after percutaneous biopsy. However, there remains substantial clot burden within the bladder. Follow-up  urology recommendations for management. Electronically Signed   By: Albin Felling M.D.   On: 08/30/2021 14:21   IR Fluoro Guide CV Line Right  Result Date: 08/31/2021 INDICATION: 70 year old male with history of acute kidney injury of uncertain etiology status post ultrasound-guided right renal biopsy on 08/28/2021. Since biopsy, the patient has experienced gross hematuria with associated acute anemia. EXAM: 1. Ultrasound-guided vascular access of the right internal jugular vein. 2. Temporary hemodialysis catheter placement. 3. Ultrasound-guided vascular access of the right common femoral artery. 4. Selective catheterization and angiography of the right renal artery. 5. Sub selective catheterization angiography of right inferior polar and arcuate artery branches. 6. Coil embolization of right inferior pole arcuate artery branch. MEDICATIONS: None. ANESTHESIA/SEDATION: Moderate (conscious) sedation was employed during this procedure. A total of Versed 3 mg and Fentanyl 50 mcg was administered  intravenously. Moderate Sedation Time: 66 minutes. The patient's level of consciousness and vital signs were monitored continuously by radiology nursing throughout the procedure under my direct supervision. CONTRAST:  60mL OMNIPAQUE IOHEXOL 350 MG/ML SOLN, 66mL OMNIPAQUE IOHEXOL 350 MG/ML SOLN FLUOROSCOPY TIME:  Fluoroscopy Time: 10.6 minutes, (811 mGy). COMPLICATIONS: None immediate. PROCEDURE: Informed consent was obtained from the patient following explanation of the procedure, risks, benefits and alternatives. The patient understands, agrees and consents for the procedure. All questions were addressed. A time out was performed prior to the initiation of the procedure. Maximal barrier sterile technique utilized including caps, mask, sterile gowns, sterile gloves, large sterile drape, hand hygiene, and chlorhexidine prep. Preprocedure ultrasound evaluation of the right internal jugular vein demonstrated a patent and  compressible vein free of internal echoes. Procedure was planned. Subdermal Local anesthesia was provided 1% lidocaine. A small skin nick was made. Under direct ultrasound visualization, a 21 gauge micropuncture needle was directed into the internal jugular vein. An image was captured and stored in the permanent record. A micropuncture set was inserted and exchanged for a J wire which was positioned in the inferior vena cava. Serial dilation was performed followed by placement of a 12.5 French, 24 cm Trialysis catheter. The catheter tip was positioned in the right atrium. Each lumen flushed and aspirated appropriately. The dialysis ports were locked with appropriate volume of heparin dwell. The middle central venous port was then used for sedation for the remainder of the procedure. The catheter was secured with a 0 silk retention suture. A sterile bandage was applied. Preprocedure ultrasound evaluation of the right groin was performed which demonstrated a patent right common femoral artery. The procedure was planned. Subdermal Local anesthesia was provided with 1% lidocaine. A small skin nick was made. Under direct ultrasound visualization, a 21 gauge micropuncture needle was directed into the common femoral artery. An ultrasound image was captured and stored in the permanent record. A micro puncture set was inserted and a limited right lower extremity angiogram was performed which demonstrated appropriate puncture site for closure device use. A J wire was directed to the abdominal aorta and the micropuncture set was exchanged for a 5 [place] vascular sheath. A 5 French C2 catheter was then directed into the right renal ostium. Right renal angiogram was performed. The single main renal artery was patent. About 2 arcuate artery branches in the inferior pole there is abnormal truncation and vessel irregularity with evidence of an early filling arteriovenous fistula in addition to suggestion of faint filling into the  collecting system on delayed imaging. A straight lantern microcatheter and 0.014" soft synchro wire was then inserted and directed into the inferior polar branch. Repeat angiogram was performed in the sub selective location which was significant for multifocal 2-4 mm pseudoaneurysm formation, abnormal truncation and irregularity of the arcuate and interlobular branches, an early arteriovenous shunting. The inferior polar arcuate branch was selected further. Coil embolization was performed with multiple low profile Penumbra Ruby coils ranging from 2-3 mm in diameter. Completion right inferior renal angiogram was performed which demonstrated appropriate embolization of the targeted vessels without persistent arteriovenous fistula or vessel irregularity. The catheters were removed. The right common femoral artery was then closed with a 6 [place] Angio-Seal device. Distal pulses were unchanged. The patient tolerated the procedure well was transferred back to the floor in good condition. IMPRESSION: 1. 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 2. Sub selective coil embolization of right inferior polar arcuate artery branch. 3. Successful placement of right internal jugular, 24 French Trialysis catheter with the catheter tip in the right atrium. The catheter is ready for immediate use. Ruthann Cancer, MD [facility] Radiology Electronically Signed   By: Ruthann Cancer M.D.   On: 08/31/2021 09:29   IR US Guide Vasc Access Right  Result Date: 08/31/2021 INDICATION: 70 year old male with history of acute kidney injury of uncertain etiology status post ultrasound-guided right renal biopsy on 08/28/2021. Since biopsy, the patient has experienced gross hematuria with associated acute anemia. EXAM: 1. Ultrasound-guided vascular access  of the right internal jugular vein. 2. Temporary hemodialysis catheter placement. 3. Ultrasound-guided vascular access of the right common femoral artery. 4. Selective catheterization and angiography of the right renal artery. 5. Sub selective catheterization angiography of right inferior polar and arcuate artery branches. 6. Coil embolization of right inferior pole arcuate artery branch. MEDICATIONS: None. ANESTHESIA/SEDATION: Moderate (conscious) sedation was employed during this procedure. A total of Versed 3 mg and Fentanyl 50 mcg was administered intravenously. Moderate Sedation Time: 66 minutes. The patient's level of consciousness and vital signs were monitored continuously by radiology nursing throughout the procedure under my direct supervision. CONTRAST:  82mL OMNIPAQUE IOHEXOL 350 MG/ML SOLN, 43mL OMNIPAQUE IOHEXOL 350 MG/ML SOLN FLUOROSCOPY TIME:  Fluoroscopy Time: 10.6 minutes, (811 mGy). COMPLICATIONS: None immediate. PROCEDURE: Informed consent was obtained from the patient following explanation of the procedure, risks, benefits and alternatives. The patient understands, agrees and consents for the procedure. All questions were addressed. A time out was performed prior to the initiation of the procedure. Maximal barrier sterile technique utilized including caps, mask, sterile gowns, sterile gloves, large sterile drape, hand hygiene, and chlorhexidine prep. Preprocedure ultrasound evaluation of the right internal jugular vein demonstrated a patent and compressible vein free of internal echoes. Procedure was planned. Subdermal Local anesthesia was provided 1% lidocaine. A small skin nick was made. Under direct ultrasound visualization, a 21 gauge micropuncture needle was directed into the internal jugular vein. An image was captured and stored in the permanent record. A micropuncture set was inserted and exchanged for a J wire which was positioned in the inferior vena cava. Serial dilation was performed  followed by placement of a 12.5 French, 24 cm Trialysis catheter. The catheter tip was positioned in the right atrium. Each lumen flushed and aspirated appropriately. The dialysis ports were locked with appropriate volume of heparin dwell. The middle central venous port was then used for sedation for the remainder of the procedure. The catheter was secured with a 0 silk retention suture. A sterile bandage was applied. Preprocedure ultrasound evaluation of the right groin was performed which demonstrated a patent right common femoral artery. The procedure was planned. Subdermal Local anesthesia was provided with 1% lidocaine. A small skin nick was made. Under direct ultrasound visualization, a 21 gauge micropuncture needle was directed into the common femoral artery. An ultrasound image was captured and stored in the permanent record. A micro puncture set was inserted and a limited right lower extremity angiogram was performed which demonstrated appropriate puncture site for closure device use. A J wire was directed to the abdominal aorta and the micropuncture set was exchanged for a 5 [place] vascular sheath. A 5 French C2 catheter was then directed into the right renal ostium. Right renal angiogram was performed. The single main renal artery was patent. About 2 arcuate artery branches in the inferior pole there  is abnormal truncation and vessel irregularity with evidence of an early filling arteriovenous fistula in addition to suggestion of faint filling into the collecting system on delayed imaging. A straight lantern microcatheter and 0.014" soft synchro wire was then inserted and directed into the inferior polar branch. Repeat angiogram was performed in the sub selective location which was significant for multifocal 2-4 mm pseudoaneurysm formation, abnormal truncation and irregularity of the arcuate and interlobular branches, an early arteriovenous shunting. The inferior polar arcuate branch was selected further.  Coil embolization was performed with multiple low profile Penumbra Ruby coils ranging from 2-3 mm in diameter. Completion right inferior renal angiogram was performed which demonstrated appropriate embolization of the targeted vessels without persistent arteriovenous fistula or vessel irregularity. The catheters were removed. The right common femoral artery was then closed with a 6 [place] Angio-Seal device. Distal pulses were unchanged. The patient tolerated the procedure well was transferred back to the floor in good condition. IMPRESSION: 1. 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 2. Sub selective coil embolization of right inferior polar arcuate artery branch. 3. Successful placement of right internal jugular, 24 French Trialysis catheter with the catheter tip in the right atrium. The catheter is ready for immediate use. Ruthann Cancer, MD [facility] Radiology Electronically Signed   By: Ruthann Cancer M.D.   On: 08/31/2021 09:29   IR US Guide Vasc Access Right  Result Date: 08/31/2021 INDICATION: 70 year old male with history of acute kidney injury of uncertain etiology status post ultrasound-guided right renal biopsy on 08/28/2021. Since biopsy, the patient has experienced gross hematuria with associated acute anemia. EXAM: 1. Ultrasound-guided vascular access of the right internal jugular vein. 2. Temporary hemodialysis catheter placement. 3. Ultrasound-guided vascular access of the right common femoral artery. 4. Selective catheterization and angiography of the right renal artery. 5. Sub selective catheterization angiography of right inferior polar and arcuate artery branches. 6. Coil embolization of right inferior pole arcuate artery branch. MEDICATIONS: None. ANESTHESIA/SEDATION: Moderate (conscious) sedation  was employed during this procedure. A total of Versed 3 mg and Fentanyl 50 mcg was administered intravenously. Moderate Sedation Time: 66 minutes. The patient's level of consciousness and vital signs were monitored continuously by radiology nursing throughout the procedure under my direct supervision. CONTRAST:  57mL OMNIPAQUE IOHEXOL 350 MG/ML SOLN, 46mL OMNIPAQUE IOHEXOL 350 MG/ML SOLN FLUOROSCOPY TIME:  Fluoroscopy Time: 10.6 minutes, (811 mGy). COMPLICATIONS: None immediate. PROCEDURE: Informed consent was obtained from the patient following explanation of the procedure, risks, benefits and alternatives. The patient understands, agrees and consents for the procedure. All questions were addressed. A time out was performed prior to the initiation of the procedure. Maximal barrier sterile technique utilized including caps, mask, sterile gowns, sterile gloves, large sterile drape, hand hygiene, and chlorhexidine prep. Preprocedure ultrasound evaluation of the right internal jugular vein demonstrated a patent and compressible vein free of internal echoes. Procedure was planned. Subdermal Local anesthesia was provided 1% lidocaine. A small skin nick was made. Under direct ultrasound visualization, a 21 gauge micropuncture needle was directed into the internal jugular vein. An image was captured and stored in the permanent record. A micropuncture set was inserted and exchanged for a J wire which was positioned in the inferior vena cava. Serial dilation was performed followed by placement of a 12.5 French, 24 cm Trialysis catheter. The catheter tip was positioned in the right atrium. Each  lumen flushed and aspirated appropriately. The dialysis ports were locked with appropriate volume of heparin dwell. The middle central venous port was then used for sedation for the remainder of the procedure. The catheter was secured with a 0 silk retention suture. A sterile bandage was applied. Preprocedure ultrasound evaluation of  the right groin was performed which demonstrated a patent right common femoral artery. The procedure was planned. Subdermal Local anesthesia was provided with 1% lidocaine. A small skin nick was made. Under direct ultrasound visualization, a 21 gauge micropuncture needle was directed into the common femoral artery. An ultrasound image was captured and stored in the permanent record. A micro puncture set was inserted and a limited right lower extremity angiogram was performed which demonstrated appropriate puncture site for closure device use. A J wire was directed to the abdominal aorta and the micropuncture set was exchanged for a 5 [place] vascular sheath. A 5 French C2 catheter was then directed into the right renal ostium. Right renal angiogram was performed. The single main renal artery was patent. 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 A straight lantern microcatheter and 0.014" soft synchro wire was then inserted and directed into the inferior polar branch. Repeat angiogram was performed in the sub selective location which was significant for multifocal 2-4 mm pseudoaneurysm formation, abnormal truncation and irregularity of the arcuate and interlobular branches, an early arteriovenous shunting. The inferior polar arcuate branch was selected further. Coil embolization was performed with multiple low profile Penumbra Ruby coils ranging from 2-3 mm in diameter. Completion right inferior renal angiogram was performed which demonstrated appropriate embolization of the targeted vessels without persistent arteriovenous fistula or vessel irregularity. The catheters were removed. The right common femoral artery was then closed with a 6 [place] Angio-Seal device. Distal pulses were unchanged. The patient  tolerated the procedure well was transferred back to the floor in good condition. IMPRESSION: 1. 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 2. Sub selective coil embolization of right inferior polar arcuate artery branch. 3. Successful placement of right internal jugular, 24 French Trialysis catheter with the catheter tip in the right atrium. The catheter is ready for immediate use. Ruthann Cancer, MD [facility] Radiology Electronically Signed   By: Ruthann Cancer M.D.   On: 08/31/2021 09:29   IR EMBO ART  VEN HEMORR LYMPH EXTRAV  INC GUIDE ROADMAPPING  Result Date: 08/31/2021 INDICATION: 70 year old male with history of acute kidney injury of uncertain etiology status post ultrasound-guided right renal biopsy on 08/28/2021. Since biopsy, the patient has experienced gross hematuria with associated acute anemia. EXAM: 1. Ultrasound-guided vascular access of the right internal jugular vein. 2. Temporary hemodialysis catheter placement. 3. Ultrasound-guided vascular access of the right common femoral artery. 4. Selective catheterization and angiography of the right renal artery. 5. Sub selective catheterization angiography of right inferior polar and arcuate artery branches. 6. Coil embolization of right inferior pole arcuate artery branch. MEDICATIONS: None. ANESTHESIA/SEDATION: Moderate (conscious) sedation was employed during this procedure. A total of Versed 3 mg and Fentanyl 50 mcg was administered intravenously. Moderate Sedation Time: 66 minutes. The patient's level of consciousness and vital signs were monitored continuously by radiology nursing throughout the procedure under my  direct supervision. CONTRAST:  72mL OMNIPAQUE IOHEXOL 350 MG/ML SOLN, 31mL OMNIPAQUE IOHEXOL 350 MG/ML SOLN FLUOROSCOPY TIME:  Fluoroscopy  Time: 10.6 minutes, (811 mGy). COMPLICATIONS: None immediate. PROCEDURE: Informed consent was obtained from the patient following explanation of the procedure, risks, benefits and alternatives. The patient understands, agrees and consents for the procedure. All questions were addressed. A time out was performed prior to the initiation of the procedure. Maximal barrier sterile technique utilized including caps, mask, sterile gowns, sterile gloves, large sterile drape, hand hygiene, and chlorhexidine prep. Preprocedure ultrasound evaluation of the right internal jugular vein demonstrated a patent and compressible vein free of internal echoes. Procedure was planned. Subdermal Local anesthesia was provided 1% lidocaine. A small skin nick was made. Under direct ultrasound visualization, a 21 gauge micropuncture needle was directed into the internal jugular vein. An image was captured and stored in the permanent record. A micropuncture set was inserted and exchanged for a J wire which was positioned in the inferior vena cava. Serial dilation was performed followed by placement of a 12.5 French, 24 cm Trialysis catheter. The catheter tip was positioned in the right atrium. Each lumen flushed and aspirated appropriately. The dialysis ports were locked with appropriate volume of heparin dwell. The middle central venous port was then used for sedation for the remainder of the procedure. The catheter was secured with a 0 silk retention suture. A sterile bandage was applied. Preprocedure ultrasound evaluation of the right groin was performed which demonstrated a patent right common femoral artery. The procedure was planned. Subdermal Local anesthesia was provided with 1% lidocaine. A small skin nick was made. Under direct ultrasound visualization, a 21 gauge micropuncture needle was directed into the common femoral artery. An ultrasound image was captured and stored in the permanent record. A micro puncture set was inserted and  a limited right lower extremity angiogram was performed which demonstrated appropriate puncture site for closure device use. A J wire was directed to the abdominal aorta and the micropuncture set was exchanged for a 5 [place] vascular sheath. A 5 French C2 catheter was then directed into the right renal ostium. Right renal angiogram was performed. The single main renal artery was patent. 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 A straight lantern microcatheter and 0.014" soft synchro wire was then inserted and directed into the inferior polar branch. Repeat angiogram was performed in the sub selective location which was significant for multifocal 2-4 mm pseudoaneurysm formation, abnormal truncation and irregularity of the arcuate and interlobular branches, an early arteriovenous shunting. The inferior polar arcuate branch was selected further. Coil embolization was performed with multiple low profile Penumbra Ruby coils ranging from 2-3 mm in diameter. Completion right inferior renal angiogram was performed which demonstrated appropriate embolization of the targeted vessels without persistent arteriovenous fistula or vessel irregularity. The catheters were removed. The right common femoral artery was then closed with a 6 [place] Angio-Seal device. Distal pulses were unchanged. The patient tolerated the procedure well was transferred back to the floor in good condition. IMPRESSION: 1. 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 2. Sub selective coil embolization of right inferior polar arcuate artery branch. 3. Successful placement of right internal jugular, 24 [place]  Trialysis catheter  with the catheter tip in the right atrium. The catheter is ready for immediate use. Ruthann Cancer, MD [facility] Radiology Electronically Signed   By: Ruthann Cancer M.D.   On: 08/31/2021 09:29    Medications: Infusions:  sodium chloride     sodium chloride 10 mL/hr at 08/27/21 2147   lactated ringers 75 mL/hr at 08/30/21 2156   sodium chloride irrigation      Scheduled Medications:  Chlorhexidine Gluconate Cloth  6 each Topical Daily   cyanocobalamin  1,000 mcg Subcutaneous [id number]   folic acid  2 mg Oral Daily   melatonin  5 mg Oral QHS   nicotine  14 mg Transdermal Q24H   pantoprazole  40 mg Oral BID AC   sodium bicarbonate  1,300 mg Oral TID   tamsulosin  0.4 mg Oral Daily    have reviewed scheduled and prn medications.  Physical Exam: General: Lying on bed comfortable, not in distress Heart: Regular rate rhythm, S1-S2 normal, no rubs Lungs: Clear bilateral, no wheezing or crackle Abdomen: Soft, nontender, nondistended Extremities: No LE edema. Neurology: Alert, awake, following commands  Caleb Simmons 08/31/2021,10:26 AM  LOS: 8 days

## 2021-08-31 NOTE — Progress Notes (Signed)
Supervising Physician: Corrie Mckusick  Patient Status:  [facility] - In-pt  Chief Complaint: Patient s/p renal biopsy 9/16 with ongoing bleeding. Patient underwent right renal angiogram with embolization of right inferior pole arcuate artery 08/30/21. Patient also had temporary dialysis catheter placed in case of possible need for HD.   Subjective: Patient in bed, denies any significant pain or discomfort. Urinal at the bedside shows dark red urine.   Allergies: Patient has no known allergies.  Medications: Prior to Admission medications   Medication Sig Start Date End Date Taking? Authorizing Provider  amoxicillin-clavulanate (AUGMENTIN) 875-125 MG tablet Take 1 tablet by mouth 2 (two) times daily. 08/14/21  Yes [provider]  Aspirin-Salicylamide-Caffeine (BC FAST PAIN RELIEF) 650-195-33.3 MG PACK Take 1 Package by mouth daily as needed (pain).   Yes [provider]  promethazine-dextromethorphan (PROMETHAZINE-DM) 6.25-15 MG/5ML syrup Take 5 mLs by mouth every 4 (four) hours as needed for cough. 08/14/21  Yes [provider]     Vital Signs: BP 127/73 (BP Location: Left Arm)   Pulse 90   Temp 98.1 F (36.7 C) (Oral)   Resp 14   Ht 5\' 10"  (1.778 m)   Wt 180 lb (81.6 kg)   SpO2 98%   BMI 25.83 kg/m   Physical Exam Constitutional:      General: He is not in acute distress. Cardiovascular:     Comments: Right groin vascular site is clean and dry. Right IJ temporary HD catheter site is clean and dry.  Pulmonary:     Effort: Pulmonary effort is normal.  Genitourinary:    Comments: Dark red urine Neurological:     Mental Status: He is alert and oriented to person, place, and time.  Psychiatric:        Mood and Affect: Mood is anxious.    Imaging: CT ABDOMEN PELVIS WO CONTRAST  Result Date: 08/30/2021 CLINICAL DATA:  Retroperitoneal hematoma, follow up s/p random renal bx with perinephric hematoma development EXAM: CT ABDOMEN AND PELVIS WITHOUT  CONTRAST TECHNIQUE: Multidetector CT imaging of the abdomen and pelvis was performed following the standard protocol without IV contrast. COMPARISON:  September 6 FINDINGS: Inferior chest: Trace bilateral pleural effusions with right greater than left compressive subsegmental atelectasis. Hepatobiliary: The liver is normal in size without focal abnormality. No intrahepatic or extrahepatic biliary ductal dilation. The gallbladder appears normal. Spleen: Normal in size without focal abnormality. Pancreas: No pancreatic ductal dilatation or surrounding inflammatory changes. Adrenals/Urinary Tract: Adrenal glands are unremarkable. The kidneys are normal in size. Tiny perinephric hematoma along the right renal lower pole is unchanged. Hyperdense material in the right collecting system and bladder consistent with blood products, grossly similar. Stomach/Bowel: The stomach, small bowel and large bowel are normal in caliber without abnormal wall thickening or surrounding inflammatory changes. Reproductive: Prostate is unremarkable. Lymphatic: No enlarged lymph nodes in the abdomen or pelvis. Vasculature: The abdominal aorta is normal in caliber. Aortic atherosclerosis. Other: No abdominopelvic ascites. Musculoskeletal: No aggressive osseous lesions. Degenerative changes at L5-S1. Bone island in the left ilium. IMPRESSION: The small perinephric hematoma along the right renal lower pole is stable, and within expected limits after percutaneous biopsy. However, there remains substantial clot burden within the bladder. Follow-up urology recommendations for management. Electronically Signed   By: Albin Felling M.D.   On: 08/30/2021 14:21   CT ABDOMEN PELVIS WO CONTRAST  Result Date: 08/29/2021 CLINICAL DATA:  Post right-sided renal biopsy, now with hematuria and hypotension. EXAM: CT ABDOMEN AND PELVIS WITHOUT CONTRAST  TECHNIQUE: Multidetector CT imaging of the abdomen and pelvis was performed following the standard protocol  without IV contrast. COMPARISON:  CT abdomen pelvis-08/23/2021; ultrasound-guided right renal biopsy-earlier same day FINDINGS: The lack of intravenous contrast limits the ability to evaluate solid abdominal organs. Lower chest: Limited visualization of the lower thorax demonstrates interval development of trace bilateral effusion with worsening bibasilar heterogeneous/consolidative opacities, right greater than left. Previously identified nonspecific ground-glass opacities within the imaged lung bases is not seen on the present examination though there is mild residual intraseptal thickening. Normal heart size. Trace amount of pericardial fluid, unchanged presumably physiologic. There is diffuse decreased attenuation intra cardiac blood pool suggestive of anemia. Hepatobiliary: Normal hepatic contour. Apparent high density material within the gallbladder could represent biliary sludge. No definitive gallbladder wall thickening or pericholecystic stranding on this noncontrast examination. No ascites. Pancreas: Normal noncontrast appearance of the pancreas. Spleen: Normal noncontrast appearance of the spleen. Adrenals/Urinary Tract: There is a very tiny (approximately 2.7 x 1.6 x 1.2 cm) perinephric hematoma about the inferior pole of the right kidney (axial image 44, series 3; coronal image 57, series 6), with minimal amount of adjacent perinephric stranding. High-density material is seen within the right renal collecting system and ureter with moderate to large amount of layering high-density material within urinary bladder, findings compatible with hemorrhage into the collecting system and bladder. Mild associated right-sided pelviectasis and ureterectasis. Normal noncontrast appearance of the left kidney. No evidence of left-sided nephrolithiasis or urinary obstruction. Normal noncontrast appearance of the bilateral adrenal glands. Stomach/Bowel: Scattered minimal colonic diverticulosis without evidence of  superimposed acute diverticulitis on this noncontrast examination. Normal appearance of the terminal ileum. The appendix is not visualized compatible with provided operative history. No discrete areas of bowel wall thickening on this noncontrast examination. No pneumoperitoneum, pneumatosis or portal venous gas. Vascular/Lymphatic: Moderate amount of atherosclerotic plaque within normal caliber abdominal aorta. Scattered retroperitoneal lymph nodes are numerous though individually not enlarged by size criteria with index left sided periaortic lymph node measuring 0.7 cm in greatest short axis diameter (image 31, series 3), presumably reactive in etiology. No bulky retroperitoneal, mesenteric, pelvic or inguinal lymphadenopathy on this noncontrast examination Reproductive: Dystrophic calcifications within normal sized prostate gland. Trace amount of fluid within the pelvic cul-de-sac. Other: Small bilateral mesenteric fat containing inguinal hernias, left greater than right. Minimal amount of subcutaneous edema about the midline of the low back. Presumed shrapnel is seen within the right lower abdominal/pelvic ventral abdominal wall. Musculoskeletal: No acute or aggressive osseous abnormalities. Mild-to-moderate multilevel lumbar spine DDD, worse at L4-L5 and L5-S1 with disc space height loss, endplate irregularity and small posteriorly directed disc osteophyte complexes at these locations. Mild degenerative change the bilateral hips with joint space loss, subchondral sclerosis and osteophytosis, right greater than left. IMPRESSION: 1. Post right-sided renal biopsy complicated by tiny (approximately 2.7 cm) perinephric hematoma and bleeding into the right renal collecting system including moderate to large-sized clot within the urinary bladder and associated mild right-sided pelviectasis and ureterectasis. Consideration for initiation of continuous bladder irrigation could be performed as indicated. 2. Trace  bilateral effusions with associated bibasilar opacities, right greater than left, likely atelectasis. 3. Colonic diverticulosis without evidence superimposed acute diverticulitis. 4.  Aortic Atherosclerosis (ICD10-I70.0). Critical Value/emergent results were called by telephone at the time of interpretation on 08/28/2021 at 5:04 pm to provider [facility] , who verbally acknowledged these results. Electronically Signed   By: Sandi Mariscal M.D.   On: 08/29/2021 10:38   IR US Guide Vasc  Access Right Right renal angiogram Embolization of right inferior pole arcuate artery  Result Date: 08/31/2021 INDICATION: 70 year old male with history of acute kidney injury of uncertain etiology status post ultrasound-guided right renal biopsy on 08/28/2021. Since biopsy, the patient has experienced gross hematuria with associated acute anemia. EXAM: 1. Ultrasound-guided vascular access of the right internal jugular vein. 2. Temporary hemodialysis catheter placement. 3. Ultrasound-guided vascular access of the right common femoral artery. 4. Selective catheterization and angiography of the right renal artery. 5. Sub selective catheterization angiography of right inferior polar and arcuate artery branches. 6. Coil embolization of right inferior pole arcuate artery branch. MEDICATIONS: None. ANESTHESIA/SEDATION: Moderate (conscious) sedation was employed during this procedure. A total of Versed 3 mg and Fentanyl 50 mcg was administered intravenously. Moderate Sedation Time: 66 minutes. The patient's level of consciousness and vital signs were monitored continuously by radiology nursing throughout the procedure under my direct supervision. CONTRAST:  80mL OMNIPAQUE IOHEXOL 350 MG/ML SOLN, 42mL OMNIPAQUE IOHEXOL 350 MG/ML SOLN FLUOROSCOPY TIME:  Fluoroscopy Time: 10.6 minutes, (811 mGy). COMPLICATIONS: None immediate. PROCEDURE: Informed consent was obtained from the patient following explanation of the procedure, risks, benefits  and alternatives. The patient understands, agrees and consents for the procedure. All questions were addressed. A time out was performed prior to the initiation of the procedure. Maximal barrier sterile technique utilized including caps, mask, sterile gowns, sterile gloves, large sterile drape, hand hygiene, and chlorhexidine prep. Preprocedure ultrasound evaluation of the right internal jugular vein demonstrated a patent and compressible vein free of internal echoes. Procedure was planned. Subdermal Local anesthesia was provided 1% lidocaine. A small skin nick was made. Under direct ultrasound visualization, a 21 gauge micropuncture needle was directed into the internal jugular vein. An image was captured and stored in the permanent record. A micropuncture set was inserted and exchanged for a J wire which was positioned in the inferior vena cava. Serial dilation was performed followed by placement of a 12.5 French, 24 cm Trialysis catheter. The catheter tip was positioned in the right atrium. Each lumen flushed and aspirated appropriately. The dialysis ports were locked with appropriate volume of heparin dwell. The middle central venous port was then used for sedation for the remainder of the procedure. The catheter was secured with a 0 silk retention suture. A sterile bandage was applied. Preprocedure ultrasound evaluation of the right groin was performed which demonstrated a patent right common femoral artery. The procedure was planned. Subdermal Local anesthesia was provided with 1% lidocaine. A small skin nick was made. Under direct ultrasound visualization, a 21 gauge micropuncture needle was directed into the common femoral artery. An ultrasound image was captured and stored in the permanent record. A micro puncture set was inserted and a limited right lower extremity angiogram was performed which demonstrated appropriate puncture site for closure device use. A J wire was directed to the abdominal aorta and  the micropuncture set was exchanged for a 5 [place] vascular sheath. A 5 French C2 catheter was then directed into the right renal ostium. Right renal angiogram was performed. The single main renal artery was patent. 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 A straight lantern microcatheter and 0.014" soft synchro wire was then inserted and directed into the inferior polar branch. Repeat angiogram was performed in the sub selective location which was significant  for multifocal 2-4 mm pseudoaneurysm formation, abnormal truncation and irregularity of the arcuate and interlobular branches, an early arteriovenous shunting. The inferior polar arcuate branch was selected further. Coil embolization was performed with multiple low profile Penumbra Ruby coils ranging from 2-3 mm in diameter. Completion right inferior renal angiogram was performed which demonstrated appropriate embolization of the targeted vessels without persistent arteriovenous fistula or vessel irregularity. The catheters were removed. The right common femoral artery was then closed with a 6 [place] Angio-Seal device. Distal pulses were unchanged. The patient tolerated the procedure well was transferred back to the floor in good condition. IMPRESSION: 1. 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 2. Sub selective coil embolization of right inferior polar arcuate artery branch. 3. Successful placement of right internal jugular, 24 French Trialysis catheter with the catheter tip in the right atrium. The catheter is ready for immediate use. Ruthann Cancer, MD [facility]  [facility] Radiology Electronically Signed   By: Ruthann Cancer M.D.   On: 08/31/2021 09:29   US BIOPSY (KIDNEY)  Result Date: 08/28/2021 INDICATION: Acute kidney injury of uncertain etiology. Please perform image guided biopsy for tissue diagnostic purposes. EXAM: ULTRASOUND GUIDED RENAL BIOPSY COMPARISON:  CT abdomen and pelvis-08/23/2021 MEDICATIONS: None. ANESTHESIA/SEDATION: Fentanyl 100 mcg IV; Versed 2 mg IV Total Moderate Sedation time: 14 minutes; The patient was continuously monitored during the procedure by the interventional radiology nurse under my direct supervision. COMPLICATIONS: None immediate. PROCEDURE: Informed written consent was obtained from the patient after a discussion of the risks, benefits and alternatives to treatment. The patient understands and consents the procedure. A timeout was performed prior to the initiation of the procedure. Ultrasound scanning was performed of the bilateral flanks. The inferior pole of the right kidney was selected for biopsy due to location and sonographic window. The procedure was planned. The operative site was prepped and draped in the usual sterile fashion. The overlying soft tissues were anesthetized with 1% lidocaine with epinephrine. A 17 gauge core needle biopsy device was advanced into the inferior cortex of the right kidney and 3 core biopsies were obtained under direct ultrasound guidance. Images were saved for documentation purposes. The biopsy device was removed and hemostasis was obtained with manual compression. Post procedural scanning was negative for significant post procedural hemorrhage or additional complication. A dressing was placed. The patient tolerated the procedure well without immediate post procedural complication. IMPRESSION: Technically successful ultrasound guided right renal biopsy. Electronically Signed   By: Sandi Mariscal M.D.   On: 08/28/2021 12:52   IRLabs:  CBC: Recent Labs    08/29/21 0040 08/29/21 0637  08/30/21 0241 08/30/21 1556 08/31/21 0230  WBC 13.1* 12.3* 12.8*  --  11.2*  HGB 7.1* 7.4* 6.4* 7.4* 7.1*  HCT 22.4* 24.1* 20.8* 23.2* 22.5*  PLT 246 236 239  --  226    COAGS: Recent Labs    08/24/21 1128 08/28/21 0305 08/29/21 0637 08/30/21 0241  INR 1.6* 1.5* 1.7* 1.5*    BMP: Recent Labs    08/28/21 0305 08/29/21 0040 08/30/21 0241 08/31/21 0230  NA 138 137 136 139  K 3.6 4.1 3.8 4.1  CL 109 109 104 106  CO2 21* 20* 23 23  GLUCOSE 93 113* 86 106*  BUN 33* 34* 35* 34*  CALCIUM 8.3* 8.1* 8.3* 8.3*  CREATININE 5.03* 5.06* 5.56* 5.93*  GFRNONAA 12* 11* 10* 10*  LIVER FUNCTION TESTS: Recent Labs    08/23/21 1037 08/24/21 0055 08/25/21 0758 08/26/21 1036 08/27/21 0029 08/28/21 0305 08/29/21 0040 08/30/21 0241 08/31/21 0230  BILITOT 0.3 0.4  --  0.8  --   --   --   --   --   AST 17 14*  --  14*  --   --   --   --   --   ALT 10 10  --  9  --   --   --   --   --   ALKPHOS 66 57  --  56  --   --   --   --   --   PROT 5.6* 4.8*  --  5.0*  --   --   --   --   --   ALBUMIN 2.2* 1.9*   < > 1.9*   < > 1.8* 1.6* 1.6* 1.7*   < > = values in this interval not displayed.    Assessment and Plan:  Patient s/p renal biopsy 9/16 with ongoing bleeding. Patient underwent right renal angiogram with embolization of right inferior pole arcuate artery 08/30/21. Patient also had temporary dialysis catheter placed in case of possible need for HD.  Hemoglobin slightly up at 7.1. Right groin vascular site is clean, dry and soft with no evidence for hematoma. Ok to remove bandage tomorrow morning. Urine is dark red; IR recommends to continue trending hemoglobin levels. Patient denies any significant pain or discomfort. Temporary dialysis catheter site is clean and dry.   Other plans per primary teams. IR will continue to follow.   Electronically Signed: Soyla Dryer, AGACNP-BC [phone] 08/31/2021, 2:34 PM   I spent a total of 15 Minutes at the the patient's bedside AND  on the patient's hospital floor or unit, greater than 50% of which was counseling/coordinating care for renal artery embolization.

## 2021-08-31 NOTE — Progress Notes (Signed)
PROGRESS NOTE        PATIENT DETAILS Name: Caleb Simmons Age: 70 y.o. Sex: male Date of Birth: 1951/10/11 Admit Date: 08/23/2021 Admitting Physician Costin Karlyne Greenspan, MD PCP:Pcp, No  Brief Narrative: Patient is a 70 y.o. male with history of nephrolithiasis-presenting with generalized weakness/shortness of breath-found to have AKI and severe normocytic anemia.  Patient was evaluated by nephrology-with concerns that patient's AKI was related to a glomerular pathology-subsequently underwent right renal biopsy on 9/6-unfortunately postrenal biopsy-patient developed bleeding into the renal collecting system-with clot retention requiring insertion of three-way catheter and CBI.  Patient continued to have renal bleeding-on 9/8-IR performed embolization.    See below for further details.  Significant events: 9/1>> admit for evaluation of AKI/severe anemia. 9/6>> ultrasound-guided biopsy of right kidney 9/6>> developed hematuria followed by acute urinary retention-presyncope and hypotension post kidney biopsy.  Three-way Foley catheter placed and CBI started. 9/8>> hematuria-hemoglobin down to 6.4-IR performed embolization and TDC placement.  Significant studies: 9/1>> CXR: No pneumonia 9/1>> VQ scan: No PE 9/1>> CT renal stone study: No hydronephrosis, distended bladder.  Diverticulosis. 9/1>> FOBT: Negative 9/1>> UA: Protein>> 300, RBCs 11-20/hpf 9/1>> HIV: Nonreactive 9/1>> HBsAg/HCV Ab: Nonreactive 9/1>> C3/C4: Normal limits 9/1>> glomerular basement Ab: Negative 9/1>> ANA: Negative 9/1>> dsDNA: Negative 9/1>> ANCA titers: Negative 9/1>> SPEP: no M  spike 9/2>> Echo: EF 37-90%, grade 1 diastolic dysfunction. 9/4>>Folic acid :2.4(OXB) 3/5>>HGD B12:298 9/6>> CT abdomen/pelvis: Tiny perinephric hematoma, bleeding into the right renal collecting system 9/8>> CT abdomen/pelvis: Substantial clot burden within the bladder-small perinephric hematoma   Antimicrobial  therapy: None  Microbiology data: 9/1>> COVID PCR: Negative  Procedures : 9/06>> ultrasound-guided biopsy of right kidney 9/8>> 1) Right IJ temporary hemodialysis catheter placement 2) Right renal angiogram 3) Selective catheterization and embolization of right inferior pole arcuate artery  Consults: Nephrology, IR, urology  DVT Prophylaxis : Place and maintain sequential compression device Start: 08/24/21 0519   Subjective: Foley catheter removed by urology yesterday.  Continues to have hematuria-but seems to be passing urine well without any signs of urinary retention.   Assessment/Plan: Acute kidney injury: Suspicion for glomerular pathology-extensive serological work-up negative-awaiting results of renal biopsy.  Creatinine had stabilized to around 5-however due to bleeding post biopsy/blood loss anemia-creatinine is slowly uptrending-however no indications for RRT yet.  Nephrology following-dialysis catheter placed by IR yesterday in case patient requires HD (concern for further worsening of AKI-contrast nephropathy with embolization procedure).  Intake/Output Summary (Last 24 hours) at 08/31/2021 1435 Last data filed at 08/31/2021 1000 Gross per 24 hour  Intake 2197.87 ml  Output 1525 ml  Net 672.87 ml     Hypotension/presyncope-hematuria-acute urinary retention (clot retention) due to bleeding into the collecting system of the right kidney post renal biopsy on 9/6-requiring three-way Foley catheter insertion and CBI and embolization by IR on 9/8 : Foley catheter removed yesterday by urology-continues to have some hematuria-but passing urine well.  Spoke with RN this morning-she is  aware that we need to do periodic bladder scans to ensure that he does not develop clot retention.   Microcytic anemia: Multifactorial anemia-from AKI/acute illness/vitamin B12 and folate deficiency-but lately due to blood loss post kidney biopsy.  Has required several units of PRBC  transfusion-hemoglobin again downtrending today-repeat CBC this afternoon.  Minimally elevated troponins: Trend is flat-not consistent with ACS-he does not have any chest pain.  Exertional dyspnea was from symptomatic anemia.  Suspect elevated troponin is in the setting of kidney failure.  Echo without any wall motion malady and preserved EF.  Doubt any further work-up is required-especially in light of severity of anemia and AKI.  Anxiety: Continue Xanax while he is in the hospital-is aware that we will likely not continue Xanax post discharge.  Use trazodone for sleep.  Patient is not interested in starting long-acting medications like SSRI for his anxiety-thinks that once he gets home-he will not require any further medications for his anxiety issues.  Tobacco abuse: Continue transdermal nicotine. Diet: Diet Order             Diet regular Room service appropriate? Yes; Fluid consistency: Thin  Diet effective now                    Code Status: Full code   Family Communication: Daughter-in-law at bedside on 9/7-none at bedside on 9/9.  Disposition Plan: Status is: Inpatient  Remains inpatient appropriate because:Inpatient level of care appropriate due to severity of illness  Dispo: The patient is from: Home              Anticipated d/c is to: Home              Patient currently is not medically stable to d/c.   Difficult to place patient No     Barriers to Discharge: Severe AKI-renal biopsy-with bleeding-and acute urinary retention requiring three-way Foley catheter with continuous bladder irrigation.  Antimicrobial agents: Anti-infectives (From admission, onward)    None        Time spent: 35 minutes-Greater than 50% of this time was spent in counseling, explanation of diagnosis, planning of further management, and coordination of care.  MEDICATIONS: Scheduled Meds:  Chlorhexidine Gluconate Cloth  6 each Topical Daily   cyanocobalamin  1,000 mcg Subcutaneous  [id number]   folic acid  2 mg Oral Daily   melatonin  5 mg Oral QHS   nicotine  14 mg Transdermal Q24H   pantoprazole  40 mg Oral BID AC   sodium bicarbonate  1,300 mg Oral TID   tamsulosin  0.4 mg Oral Daily   Continuous Infusions:  sodium chloride     sodium chloride 10 mL/hr at 08/27/21 2147   albumin human 25 g (08/31/21 1220)   lactated ringers 75 mL/hr at 08/31/21 1130   sodium chloride irrigation     PRN Meds:.acetaminophen, ALPRAZolam, bisacodyl, diphenhydrAMINE, heparin sodium (porcine), HYDROmorphone (DILAUDID) injection, lidocaine, midazolam, midazolam, ondansetron **OR** ondansetron (ZOFRAN) IV, promethazine, technetium albumin aggregated, traZODone   PHYSICAL EXAM: Vital signs: Vitals:   08/30/21 2030 08/30/21 2031 08/31/21 0005 08/31/21 1240  BP: (!) 150/83 (!) 150/83 125/70 127/73  Pulse:  88 80 90  Resp: 11 14 19 14   Temp:  98.2 F (36.8 C)  98.1 F (36.7 C)  TempSrc:  Oral  Oral  SpO2: 94% 93% 98%   Weight:      Height:       Filed Weights   08/23/21 0816  Weight: 81.6 kg   Body mass index is 25.83 kg/m.   Gen Exam:Alert awake-not in any distress HEENT:atraumatic, normocephalic Chest: B/L clear to auscultation anteriorly CVS:S1S2 regular Abdomen:soft non tender, non distended Extremities:no edema Neurology: Non focal Skin: no rash   I have personally reviewed following labs and imaging studies  LABORATORY DATA: CBC: Recent Labs  Lab 08/28/21 0305 08/28/21 1617 08/28/21 1840 08/29/21 0040 08/29/21 1025 08/30/21 0241 08/30/21  1556 08/31/21 0230  WBC 9.3   < > 14.5* 13.1* 12.3* 12.8*  --  11.2*  NEUTROABS 5.8  --   --   --   --   --   --   --   HGB 8.4*   < > 8.4* 7.1* 7.4* 6.4* 7.4* 7.1*  HCT 26.8*   < > 26.5* 22.4* 24.1* 20.8* 23.2* 22.5*  MCV 82.2   < > 82.6 83.6 84.6 85.2  --  84.3  PLT 263   < > 298 246 236 239  --  226   < > = values in this interval not displayed.     Basic Metabolic Panel: Recent Labs  Lab 08/25/21 0037  08/25/21 0758 08/27/21 0029 08/28/21 0305 08/29/21 0040 08/30/21 0241 08/31/21 0230  NA  --    < > 139 138 137 136 139  K  --    < > 3.8 3.6 4.1 3.8 4.1  CL  --    < > 111 109 109 104 106  CO2  --    < > 21* 21* 20* 23 23  GLUCOSE  --    < > 92 93 113* 86 106*  BUN  --    < > 35* 33* 34* 35* 34*  CREATININE  --    < > 5.09* 5.03* 5.06* 5.56* 5.93*  CALCIUM  --    < > 8.2* 8.3* 8.1* 8.3* 8.3*  MG 2.1  --   --   --   --   --   --   PHOS  --    < > 4.3 4.7* 6.5* 5.2* 5.8*   < > = values in this interval not displayed.     GFR: Estimated Creatinine Clearance: 11.8 mL/min (A) (by C-G formula based on SCr of 5.93 mg/dL (H)).  Liver Function Tests: Recent Labs  Lab 08/26/21 1036 08/27/21 0029 08/28/21 0305 08/29/21 0040 08/30/21 0241 08/31/21 0230  AST 14*  --   --   --   --   --   ALT 9  --   --   --   --   --   ALKPHOS 56  --   --   --   --   --   BILITOT 0.8  --   --   --   --   --   PROT 5.0*  --   --   --   --   --   ALBUMIN 1.9* 1.8* 1.8* 1.6* 1.6* 1.7*    No results for input(s): LIPASE, AMYLASE in the last 168 hours.  No results for input(s): AMMONIA in the last 168 hours.  Coagulation Profile: Recent Labs  Lab 08/28/21 0305 08/29/21 0637 08/30/21 0241  INR 1.5* 1.7* 1.5*     Cardiac Enzymes: No results for input(s): CKTOTAL, CKMB, CKMBINDEX, TROPONINI in the last 168 hours.  BNP (last 3 results) No results for input(s): PROBNP in the last 8760 hours.  Lipid Profile: No results for input(s): CHOL, HDL, LDLCALC, TRIG, CHOLHDL, LDLDIRECT in the last 72 hours.  Thyroid Function Tests: No results for input(s): TSH, T4TOTAL, FREET4, T3FREE, THYROIDAB in the last 72 hours.  Anemia Panel: No results for input(s): VITAMINB12, FOLATE, FERRITIN, TIBC, IRON, RETICCTPCT in the last 72 hours.   Urine analysis:    Component Value Date/Time   COLORURINE STRAW (A) 08/23/2021 1140   APPEARANCEUR CLEAR 08/23/2021 1140   LABSPEC 1.008 08/23/2021 1140    PHURINE 6.0 08/23/2021 1140  GLUCOSEU 50 (A) 08/23/2021 1140   HGBUR SMALL (A) 08/23/2021 1140   BILIRUBINUR NEGATIVE 08/23/2021 1140   KETONESUR NEGATIVE 08/23/2021 1140   PROTEINUR >=300 (A) 08/23/2021 1140   UROBILINOGEN 0.2 06/26/2007 1023   NITRITE NEGATIVE 08/23/2021 1140   LEUKOCYTESUR NEGATIVE 08/23/2021 1140    Sepsis Labs: Lactic Acid, Venous No results found for: LATICACIDVEN  MICROBIOLOGY: Recent Results (from the past 240 hour(s))  SARS CORONAVIRUS 2 (TAT 6-24 HRS) Nasopharyngeal Nasopharyngeal Swab     Status: None   Collection Time: 08/23/21  5:11 PM   Specimen: Nasopharyngeal Swab  Result Value Ref Range Status   SARS Coronavirus 2 NEGATIVE NEGATIVE Final    Comment: (NOTE) SARS-CoV-2 target nucleic acids are NOT DETECTED.  The SARS-CoV-2 RNA is generally detectable in upper and lower respiratory specimens during the acute phase of infection. Negative results do not preclude SARS-CoV-2 infection, do not rule out co-infections with other pathogens, and should not be used as the sole basis for treatment or other patient management decisions. Negative results must be combined with clinical observations, patient history, and epidemiological information. The expected result is Negative.  Fact Sheet for Patients: SugarRoll.be  Fact Sheet for Healthcare Providers: https://www.woods-mathews.com/  This test is not yet approved or cleared by the [place] FDA and  has been authorized for detection and/or diagnosis of SARS-CoV-2 by FDA under an Emergency Use Authorization (EUA). This EUA will remain  in effect (meaning this test can be used) for the duration of the COVID-19 declaration under Se ction 564(b)(1) of the Act, 21 U.S.C. section 360bbb-3(b)(1), unless the authorization is terminated or revoked sooner.  Performed at [facility], [place] [address] [place], [place] [address]     RADIOLOGY  STUDIES/RESULTS: CT ABDOMEN PELVIS WO CONTRAST  Result Date: 08/30/2021 CLINICAL DATA:  Retroperitoneal hematoma, follow up s/p random renal bx with perinephric hematoma development EXAM: CT ABDOMEN AND PELVIS WITHOUT CONTRAST TECHNIQUE: Multidetector CT imaging of the abdomen and pelvis was performed following the standard protocol without IV contrast. COMPARISON:  September 6 FINDINGS: Inferior chest: Trace bilateral pleural effusions with right greater than left compressive subsegmental atelectasis. Hepatobiliary: The liver is normal in size without focal abnormality. No intrahepatic or extrahepatic biliary ductal dilation. The gallbladder appears normal. Spleen: Normal in size without focal abnormality. Pancreas: No pancreatic ductal dilatation or surrounding inflammatory changes. Adrenals/Urinary Tract: Adrenal glands are unremarkable. The kidneys are normal in size. Tiny perinephric hematoma along the right renal lower pole is unchanged. Hyperdense material in the right collecting system and bladder consistent with blood products, grossly similar. Stomach/Bowel: The stomach, small bowel and large bowel are normal in caliber without abnormal wall thickening or surrounding inflammatory changes. Reproductive: Prostate is unremarkable. Lymphatic: No enlarged lymph nodes in the abdomen or pelvis. Vasculature: The abdominal aorta is normal in caliber. Aortic atherosclerosis. Other: No abdominopelvic ascites. Musculoskeletal: No aggressive osseous lesions. Degenerative changes at L5-S1. Bone island in the left ilium. IMPRESSION: The small perinephric hematoma along the right renal lower pole is stable, and within expected limits after percutaneous biopsy. However, there remains substantial clot burden within the bladder. Follow-up urology recommendations for management. Electronically Signed   By: Albin Felling M.D.   On: 08/30/2021 14:21   IR Angiogram Renal Left Selective  INDICATION: 70 year old male with  history of acute kidney injury of uncertain etiology status post ultrasound-guided right renal biopsy on 08/28/2021. Since biopsy, the patient has experienced gross hematuria with associated acute anemia.   EXAM: 1. Ultrasound-guided  vascular access of the right internal jugular vein. 2. Temporary hemodialysis catheter placement. 3. Ultrasound-guided vascular access of the right common femoral artery. 4. Selective catheterization and angiography of the right renal artery. 5. Sub selective catheterization angiography of right inferior polar and arcuate artery branches. 6. Coil embolization of right inferior pole arcuate artery branch.   MEDICATIONS: None.   ANESTHESIA/SEDATION: Moderate (conscious) sedation was employed during this procedure. A total of Versed 3 mg and Fentanyl 50 mcg was administered intravenously.   Moderate Sedation Time: 66 minutes. The patient's level of consciousness and vital signs were monitored continuously by radiology nursing throughout the procedure under my direct supervision.   CONTRAST:  39mL OMNIPAQUE IOHEXOL 350 MG/ML SOLN, 74mL OMNIPAQUE IOHEXOL 350 MG/ML SOLN   FLUOROSCOPY TIME:  Fluoroscopy Time: 10.6 minutes, (811 mGy).   COMPLICATIONS: None immediate.   PROCEDURE: Informed consent was obtained from the patient following explanation of the procedure, risks, benefits and alternatives. The patient understands, agrees and consents for the procedure. All questions were addressed. A time out was performed prior to the initiation of the procedure. Maximal barrier sterile technique utilized including caps, mask, sterile gowns, sterile gloves, large sterile drape, hand hygiene, and chlorhexidine prep.   Preprocedure ultrasound evaluation of the right internal jugular vein demonstrated a patent and compressible vein free of internal echoes. Procedure was planned. Subdermal Local anesthesia was provided 1% lidocaine. A small skin nick was made. Under direct ultrasound visualization, a 21  gauge micropuncture needle was directed into the internal jugular vein. An image was captured and stored in the permanent record. A micropuncture set was inserted and exchanged for a J wire which was positioned in the inferior vena cava. Serial dilation was performed followed by placement of a 12.5 French, 24 cm Trialysis catheter. The catheter tip was positioned in the right atrium. Each lumen flushed and aspirated appropriately. The dialysis ports were locked with appropriate volume of heparin dwell. The middle central venous port was then used for sedation for the remainder of the procedure. The catheter was secured with a 0 silk retention suture. A sterile bandage was applied.   Preprocedure ultrasound evaluation of the right groin was performed which demonstrated a patent right common femoral artery. The procedure was planned. Subdermal Local anesthesia was provided with 1% lidocaine. A small skin nick was made. Under direct ultrasound visualization, a 21 gauge micropuncture needle was directed into the common femoral artery. An ultrasound image was captured and stored in the permanent record. A micro puncture set was inserted and a limited right lower extremity angiogram was performed which demonstrated appropriate puncture site for closure device use. A J wire was directed to the abdominal aorta and the micropuncture set was exchanged for a 5 [place] vascular sheath. A 5 French C2 catheter was then directed into the right renal ostium. Right renal angiogram was performed. The single main renal artery was patent. 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   A straight lantern microcatheter and 0.014" soft synchro wire was then inserted and directed into the inferior polar branch. Repeat angiogram was  performed in the sub selective location which was significant for multifocal 2-4 mm pseudoaneurysm formation, abnormal truncation and irregularity of the arcuate and interlobular branches, an early arteriovenous shunting. The inferior polar arcuate branch was selected further. Coil embolization was  performed with multiple low profile Penumbra Ruby coils ranging from 2-3 mm in diameter. Completion right inferior renal angiogram was performed which demonstrated appropriate embolization of the targeted vessels without persistent arteriovenous fistula or vessel irregularity.   The catheters were removed. The right common femoral artery was then closed with a 6 [place] Angio-Seal device. Distal pulses were unchanged. The patient tolerated the procedure well was transferred back to the floor in good condition.   IMPRESSION: 1. 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 2. Sub selective coil embolization of right inferior polar arcuate artery branch. 3. Successful placement of right internal jugular, 24 French Trialysis catheter with the catheter tip in the right atrium. The catheter is ready for immediate use.   Ruthann Cancer, MD   [facility]   [facility]     Electronically Signed   By: Ruthann Cancer M.D.   On: 08/31/2021 09:29    IR Fluoro Guide CV Line Right  Result Date: 08/31/2021 INDICATION: 70 year old male with history of acute kidney injury of uncertain etiology status post ultrasound-guided right renal biopsy on 08/28/2021. Since biopsy, the patient has experienced gross hematuria with associated acute anemia. EXAM: 1. Ultrasound-guided vascular access of the right internal jugular vein. 2. Temporary hemodialysis catheter placement. 3. Ultrasound-guided vascular access of the right common femoral artery. 4. Selective  catheterization and angiography of the right renal artery. 5. Sub selective catheterization angiography of right inferior polar and arcuate artery branches. 6. Coil embolization of right inferior pole arcuate artery branch. MEDICATIONS: None. ANESTHESIA/SEDATION: Moderate (conscious) sedation was employed during this procedure. A total of Versed 3 mg and Fentanyl 50 mcg was administered intravenously. Moderate Sedation Time: 66 minutes. The patient's level of consciousness and vital signs were monitored continuously by radiology nursing throughout the procedure under my direct supervision. CONTRAST:  69mL OMNIPAQUE IOHEXOL 350 MG/ML SOLN, 14mL OMNIPAQUE IOHEXOL 350 MG/ML SOLN FLUOROSCOPY TIME:  Fluoroscopy Time: 10.6 minutes, (811 mGy). COMPLICATIONS: None immediate. PROCEDURE: Informed consent was obtained from the patient following explanation of the procedure, risks, benefits and alternatives. The patient understands, agrees and consents for the procedure. All questions were addressed. A time out was performed prior to the initiation of the procedure. Maximal barrier sterile technique utilized including caps, mask, sterile gowns, sterile gloves, large sterile drape, hand hygiene, and chlorhexidine prep. Preprocedure ultrasound evaluation of the right internal jugular vein demonstrated a patent and compressible vein free of internal echoes. Procedure was planned. Subdermal Local anesthesia was provided 1% lidocaine. A small skin nick was made. Under direct ultrasound visualization, a 21 gauge micropuncture needle was directed into the internal jugular vein. An image was captured and stored in the permanent record. A micropuncture set was inserted and exchanged for a J wire which was positioned in the inferior vena cava. Serial dilation was performed followed by placement of a 12.5 French, 24 cm Trialysis catheter. The catheter tip was positioned in the right atrium. Each lumen flushed and aspirated appropriately.  The dialysis ports were locked with appropriate volume of heparin dwell. The middle central venous port was then used for sedation for the remainder of the procedure. The catheter was secured with a 0 silk retention suture. A sterile bandage was applied. Preprocedure ultrasound evaluation of the right groin was performed which demonstrated a patent right common femoral artery. The procedure was planned. Subdermal Local anesthesia was provided with 1%  lidocaine. A small skin nick was made. Under direct ultrasound visualization, a 21 gauge micropuncture needle was directed into the common femoral artery. An ultrasound image was captured and stored in the permanent record. A micro puncture set was inserted and a limited right lower extremity angiogram was performed which demonstrated appropriate puncture site for closure device use. A J wire was directed to the abdominal aorta and the micropuncture set was exchanged for a 5 [place] vascular sheath. A 5 French C2 catheter was then directed into the right renal ostium. Right renal angiogram was performed. The single main renal artery was patent. 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 A straight lantern microcatheter and 0.014" soft synchro wire was then inserted and directed into the inferior polar branch. Repeat angiogram was performed in the sub selective location which was significant for multifocal 2-4 mm pseudoaneurysm formation, abnormal truncation and irregularity of the arcuate and interlobular branches, an early arteriovenous shunting. The inferior polar arcuate branch was selected further. Coil embolization was performed with multiple low profile Penumbra Ruby coils ranging from 2-3 mm in diameter. Completion right inferior renal angiogram was performed  which demonstrated appropriate embolization of the targeted vessels without persistent arteriovenous fistula or vessel irregularity. The catheters were removed. The right common femoral artery was then closed with a 6 [place] Angio-Seal device. Distal pulses were unchanged. The patient tolerated the procedure well was transferred back to the floor in good condition. IMPRESSION: 1. 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 2. Sub selective coil embolization of right inferior polar arcuate artery branch. 3. Successful placement of right internal jugular, 24 French Trialysis catheter with the catheter tip in the right atrium. The catheter is ready for immediate use. Ruthann Cancer, MD [facility] Electronically Signed   By: Ruthann Cancer M.D.   On: 08/31/2021 09:29   IR US Guide Vasc Access Right  Result Date: 08/31/2021 INDICATION: 70 year old male with history of acute kidney injury of uncertain etiology status post ultrasound-guided right renal biopsy on 08/28/2021. Since biopsy, the patient has experienced gross hematuria with associated acute anemia. EXAM: 1. Ultrasound-guided vascular access of the right internal jugular vein. 2. Temporary hemodialysis catheter placement. 3. Ultrasound-guided vascular access of the right common femoral artery. 4. Selective catheterization and angiography of the right renal artery. 5. Sub selective catheterization angiography of right inferior polar and arcuate artery branches. 6. Coil embolization of right inferior pole arcuate artery branch. MEDICATIONS: None. ANESTHESIA/SEDATION: Moderate (conscious) sedation was employed during this procedure. A total of Versed 3 mg and Fentanyl 50 mcg was administered intravenously. Moderate Sedation Time: 66 minutes. The patient's level  of consciousness and vital signs were monitored continuously by radiology nursing throughout the procedure under my direct supervision. CONTRAST:  29mL OMNIPAQUE IOHEXOL 350 MG/ML SOLN, 65mL OMNIPAQUE IOHEXOL 350 MG/ML SOLN FLUOROSCOPY TIME:  Fluoroscopy Time: 10.6 minutes, (811 mGy). COMPLICATIONS: None immediate. PROCEDURE: Informed consent was obtained from the patient following explanation of the procedure, risks, benefits and alternatives. The patient understands, agrees and consents for the procedure. All questions were addressed. A time out was performed prior to the initiation of the procedure. Maximal barrier sterile technique utilized including caps, mask, sterile gowns, sterile  gloves, large sterile drape, hand hygiene, and chlorhexidine prep. Preprocedure ultrasound evaluation of the right internal jugular vein demonstrated a patent and compressible vein free of internal echoes. Procedure was planned. Subdermal Local anesthesia was provided 1% lidocaine. A small skin nick was made. Under direct ultrasound visualization, a 21 gauge micropuncture needle was directed into the internal jugular vein. An image was captured and stored in the permanent record. A micropuncture set was inserted and exchanged for a J wire which was positioned in the inferior vena cava. Serial dilation was performed followed by placement of a 12.5 French, 24 cm Trialysis catheter. The catheter tip was positioned in the right atrium. Each lumen flushed and aspirated appropriately. The dialysis ports were locked with appropriate volume of heparin dwell. The middle central venous port was then used for sedation for the remainder of the procedure. The catheter was secured with a 0 silk retention suture. A sterile bandage was applied. Preprocedure ultrasound evaluation of the right groin was performed which demonstrated a patent right common femoral artery. The procedure was planned. Subdermal Local anesthesia was provided with 1%  lidocaine. A small skin nick was made. Under direct ultrasound visualization, a 21 gauge micropuncture needle was directed into the common femoral artery. An ultrasound image was captured and stored in the permanent record. A micro puncture set was inserted and a limited right lower extremity angiogram was performed which demonstrated appropriate puncture site for closure device use. A J wire was directed to the abdominal aorta and the micropuncture set was exchanged for a 5 [place] vascular sheath. A 5 French C2 catheter was then directed into the right renal ostium. Right renal angiogram was performed. The single main renal artery was patent. 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 A straight lantern microcatheter and 0.014" soft synchro wire was then inserted and directed into the inferior polar branch. Repeat angiogram was performed in the sub selective location which was significant for multifocal 2-4 mm pseudoaneurysm formation, abnormal truncation and irregularity of the arcuate and interlobular branches, an early arteriovenous shunting. The inferior polar arcuate branch was selected further. Coil embolization was performed with multiple low profile Penumbra Ruby coils ranging from 2-3 mm in diameter. Completion right inferior renal angiogram was performed which demonstrated appropriate embolization of the targeted vessels without persistent arteriovenous fistula or vessel irregularity. The catheters were removed. The right common femoral artery was then closed with a 6 [place] Angio-Seal device. Distal pulses were unchanged. The patient tolerated the procedure well was transferred back to the floor in good condition. IMPRESSION: 1. 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 2. Sub selective coil embolization of right inferior polar arcuate artery branch. 3. Successful placement of right internal jugular, 24 French Trialysis catheter with the catheter tip in the right atrium. The catheter is ready for immediate use. Ruthann Cancer, MD [facility] Electronically Signed   By: Ruthann Cancer M.D.   On: 08/31/2021 09:29   IR US Guide Vasc Access Right  Result Date: 08/31/2021 INDICATION: 70 year old male with history of acute kidney injury of uncertain etiology status post ultrasound-guided right renal biopsy on 08/28/2021. Since biopsy, the patient has experienced gross hematuria with associated acute  anemia. EXAM: 1. Ultrasound-guided vascular access of the right internal jugular vein. 2. Temporary hemodialysis catheter placement. 3. Ultrasound-guided vascular access of the right common femoral artery. 4. Selective catheterization and angiography of the right renal artery. 5. Sub selective catheterization angiography of right inferior polar and arcuate artery branches. 6. Coil embolization of right inferior pole arcuate artery branch. MEDICATIONS: None. ANESTHESIA/SEDATION: Moderate (conscious) sedation was employed during this procedure. A total of Versed 3 mg and Fentanyl 50 mcg was administered intravenously. Moderate Sedation Time: 66 minutes. The patient's level of consciousness and vital signs were monitored continuously by radiology nursing throughout the procedure under my direct supervision. CONTRAST:  34mL OMNIPAQUE IOHEXOL 350 MG/ML SOLN, 26mL OMNIPAQUE IOHEXOL 350 MG/ML SOLN FLUOROSCOPY TIME:  Fluoroscopy Time: 10.6 minutes, (811 mGy). COMPLICATIONS: None immediate. PROCEDURE: Informed consent was obtained from the patient following explanation of the procedure, risks, benefits and  alternatives. The patient understands, agrees and consents for the procedure. All questions were addressed. A time out was performed prior to the initiation of the procedure. Maximal barrier sterile technique utilized including caps, mask, sterile gowns, sterile gloves, large sterile drape, hand hygiene, and chlorhexidine prep. Preprocedure ultrasound evaluation of the right internal jugular vein demonstrated a patent and compressible vein free of internal echoes. Procedure was planned. Subdermal Local anesthesia was provided 1% lidocaine. A small skin nick was made. Under direct ultrasound visualization, a 21 gauge micropuncture needle was directed into the internal jugular vein. An image was captured and stored in the permanent record. A micropuncture set was inserted and exchanged for a J wire which was positioned in the inferior vena cava. Serial dilation was performed followed by placement of a 12.5 French, 24 cm Trialysis catheter. The catheter tip was positioned in the right atrium. Each lumen flushed and aspirated appropriately. The dialysis ports were locked with appropriate volume of heparin dwell. The middle central venous port was then used for sedation for the remainder of the procedure. The catheter was secured with a 0 silk retention suture. A sterile bandage was applied. Preprocedure ultrasound evaluation of the right groin was performed which demonstrated a patent right common femoral artery. The procedure was planned. Subdermal Local anesthesia was provided with 1% lidocaine. A small skin nick was made. Under direct ultrasound visualization, a 21 gauge micropuncture needle was directed into the common femoral artery. An ultrasound image was captured and stored in the permanent record. A micro puncture set was inserted and a limited right lower extremity angiogram was performed which demonstrated appropriate puncture site for closure device use. A J wire was directed to the abdominal aorta and the  micropuncture set was exchanged for a 5 [place] vascular sheath. A 5 French C2 catheter was then directed into the right renal ostium. Right renal angiogram was performed. The single main renal artery was patent. 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 A straight lantern microcatheter and 0.014" soft synchro wire was then inserted and directed into the inferior polar branch. Repeat angiogram was performed in the sub selective location which was significant for multifocal 2-4 mm pseudoaneurysm formation, abnormal truncation and irregularity of the arcuate and interlobular branches, an early arteriovenous shunting. The inferior polar arcuate branch was selected further. Coil embolization was performed with multiple low profile Penumbra Ruby coils ranging from 2-3 mm in diameter. Completion right  inferior renal angiogram was performed which demonstrated appropriate embolization of the targeted vessels without persistent arteriovenous fistula or vessel irregularity. The catheters were removed. The right common femoral artery was then closed with a 6 [place] Angio-Seal device. Distal pulses were unchanged. The patient tolerated the procedure well was transferred back to the floor in good condition. IMPRESSION: 1. 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 2. Sub selective coil embolization of right inferior polar arcuate artery branch. 3. Successful placement of right internal jugular, 24 French Trialysis catheter with the catheter tip in the right atrium. The catheter is ready for immediate use. Ruthann Cancer, MD [facility]  [facility] Electronically Signed   By: Ruthann Cancer M.D.   On: 08/31/2021 09:29   IR EMBO ART  VEN HEMORR LYMPH EXTRAV  INC GUIDE ROADMAPPING  Result Date: 08/31/2021 INDICATION: 70 year old male with history of acute kidney injury of uncertain etiology status post ultrasound-guided right renal biopsy on 08/28/2021. Since biopsy, the patient has experienced gross hematuria with associated acute anemia. EXAM: 1. Ultrasound-guided vascular access of the right internal jugular vein. 2. Temporary hemodialysis catheter placement. 3. Ultrasound-guided vascular access of the right common femoral artery. 4. Selective catheterization and angiography of the right renal artery. 5. Sub selective catheterization angiography of right inferior polar and arcuate artery branches. 6. Coil embolization of right inferior pole arcuate artery branch. MEDICATIONS: None. ANESTHESIA/SEDATION: Moderate (conscious) sedation was employed during this procedure. A total of Versed 3 mg and Fentanyl 50 mcg was administered intravenously. Moderate Sedation Time: 66 minutes. The patient's level of consciousness and vital signs were monitored continuously by radiology nursing throughout the procedure under my direct supervision. CONTRAST:  70mL OMNIPAQUE IOHEXOL 350 MG/ML SOLN, 43mL OMNIPAQUE IOHEXOL 350 MG/ML SOLN FLUOROSCOPY TIME:  Fluoroscopy Time: 10.6 minutes, (811 mGy). COMPLICATIONS: None immediate. PROCEDURE: Informed consent was obtained from the patient following explanation of the procedure, risks, benefits and alternatives. The patient understands, agrees and consents for the procedure. All questions were addressed. A time out was performed prior to the initiation of the procedure. Maximal barrier sterile technique utilized including caps, mask, sterile gowns, sterile gloves, large sterile drape, hand hygiene, and chlorhexidine prep. Preprocedure ultrasound evaluation of the right internal jugular vein demonstrated a patent  and compressible vein free of internal echoes. Procedure was planned. Subdermal Local anesthesia was provided 1% lidocaine. A small skin nick was made. Under direct ultrasound visualization, a 21 gauge micropuncture needle was directed into the internal jugular vein. An image was captured and stored in the permanent record. A micropuncture set was inserted and exchanged for a J wire which was positioned in the inferior vena cava. Serial dilation was performed followed by placement of a 12.5 French, 24 cm Trialysis catheter. The catheter tip was positioned in the right atrium. Each lumen flushed and aspirated appropriately. The dialysis ports were locked with appropriate volume of heparin dwell. The middle central venous port was then used for sedation for the remainder of the procedure. The catheter was secured with a 0 silk retention suture. A sterile bandage was applied. Preprocedure ultrasound evaluation of the right groin was performed which demonstrated a patent right common femoral artery. The procedure was planned. Subdermal Local anesthesia was provided with 1% lidocaine. A small skin nick was made. Under direct ultrasound visualization, a 21 gauge micropuncture needle was directed into the common femoral artery. An ultrasound  image was captured and stored in the permanent record. A micro puncture set was inserted and a limited right lower extremity angiogram was performed which demonstrated appropriate puncture site for closure device use. A J wire was directed to the abdominal aorta and the micropuncture set was exchanged for a 5 [place] vascular sheath. A 5 French C2 catheter was then directed into the right renal ostium. Right renal angiogram was performed. The single main renal artery was patent. About 2 arcuate artery branches in the inferior pole there is abnormal truncation and vessel irregularity with evidence of an early filling arteriovenous fistula in addition to suggestion of faint filling into  the collecting system on delayed imaging. A straight lantern microcatheter and 0.014" soft synchro wire was then inserted and directed into the inferior polar branch. Repeat angiogram was performed in the sub selective location which was significant for multifocal 2-4 mm pseudoaneurysm formation, abnormal truncation and irregularity of the arcuate and interlobular branches, an early arteriovenous shunting. The inferior polar arcuate branch was selected further. Coil embolization was performed with multiple low profile Penumbra Ruby coils ranging from 2-3 mm in diameter. Completion right inferior renal angiogram was performed which demonstrated appropriate embolization of the targeted vessels without persistent arteriovenous fistula or vessel irregularity. The catheters were removed. The right common femoral artery was then closed with a 6 [place] Angio-Seal device. Distal pulses were unchanged. The patient tolerated the procedure well was transferred back to the floor in good condition. IMPRESSION: 1. 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 2. Sub selective coil embolization of right inferior polar arcuate artery branch. 3. Successful placement of right internal jugular, 24 French Trialysis catheter with the catheter tip in the right atrium. The catheter is ready for immediate use. Ruthann Cancer, MD [facility] Radiology Electronically Signed   By: Ruthann Cancer M.D.   On: 08/31/2021 09:29     LOS: 8 days   Oren Binet, MD  [facility]    To contact the attending provider between 7A-7P or the covering provider during after hours 7P-7A, please log into the web site [email or web address] and access using universal [place] password for that web site. If you do not have the password, please call the hospital  operator.  08/31/2021, 2:35 PM

## 2021-08-31 NOTE — Progress Notes (Addendum)
Pt had 16 beats run of SVT while in resting in bed.  He was asymptomatic.  Dr. Sloan Leiter made aware.  Idolina Primer, RN

## 2021-08-31 NOTE — Plan of Care (Signed)
  Problem: Coping: Goal: Level of anxiety will decrease Outcome: Progressing   Problem: Elimination: Goal: Will not experience complications related to urinary retention Outcome: Progressing   

## 2021-09-01 DIAGNOSIS — N179 Acute kidney failure, unspecified: Secondary | ICD-10-CM | POA: Diagnosis not present

## 2021-09-01 DIAGNOSIS — D649 Anemia, unspecified: Secondary | ICD-10-CM | POA: Diagnosis not present

## 2021-09-01 LAB — CBC
HCT: 21.4 % — ABNORMAL LOW (ref 39.0–52.0)
Hemoglobin: 6.6 g/dL — CL (ref 13.0–17.0)
MCH: 26.6 pg (ref 26.0–34.0)
MCHC: 30.8 g/dL (ref 30.0–36.0)
MCV: 86.3 fL (ref 80.0–100.0)
Platelets: 246 10*3/uL (ref 150–400)
RBC: 2.48 MIL/uL — ABNORMAL LOW (ref 4.22–5.81)
RDW: 19 % — ABNORMAL HIGH (ref 11.5–15.5)
WBC: 11.3 10*3/uL — ABNORMAL HIGH (ref 4.0–10.5)
nRBC: 0.2 % (ref 0.0–0.2)

## 2021-09-01 LAB — RENAL FUNCTION PANEL
Albumin: 2 g/dL — ABNORMAL LOW (ref 3.5–5.0)
Anion gap: 11 (ref 5–15)
BUN: 36 mg/dL — ABNORMAL HIGH (ref 8–23)
CO2: 23 mmol/L (ref 22–32)
Calcium: 8.2 mg/dL — ABNORMAL LOW (ref 8.9–10.3)
Chloride: 104 mmol/L (ref 98–111)
Creatinine, Ser: 5.98 mg/dL — ABNORMAL HIGH (ref 0.61–1.24)
GFR, Estimated: 9 mL/min — ABNORMAL LOW (ref >60)
Glucose, Bld: 103 mg/dL — ABNORMAL HIGH (ref 70–99)
Phosphorus: 4.5 mg/dL (ref 2.5–4.6)
Potassium: 3.9 mmol/L (ref 3.5–5.1)
Sodium: 138 mmol/L (ref 135–145)

## 2021-09-01 LAB — PREPARE RBC (CROSSMATCH)

## 2021-09-01 MED ORDER — SODIUM CHLORIDE 0.9% IV SOLUTION: Freq: Once | INTRAVENOUS | Status: AC

## 2021-09-01 MED ORDER — HYDROMORPHONE HCL 1 MG/ML IJ SOLN
1.0000 mg | Freq: Once | INTRAMUSCULAR | Status: AC
  Administered 2021-09-02: 1 mg via INTRAVENOUS
  Filled 2021-09-01: qty 1

## 2021-09-01 NOTE — Progress Notes (Signed)
PROGRESS NOTE        PATIENT DETAILS Name: Caleb Simmons Age: 70 y.o. Sex: male Date of Birth: November 13, 1951 Admit Date: 08/23/2021 Admitting Physician Costin Karlyne Greenspan, MD PCP:Pcp, No  Brief Narrative:  Patient is a 70 y.o. male with history of nephrolithiasis-presenting with generalized weakness/shortness of breath-found to have AKI and severe normocytic anemia.  Patient was evaluated by nephrology-with concerns that patient's AKI was related to a glomerular pathology-subsequently underwent right renal biopsy on 9/6-unfortunately postrenal biopsy-patient developed bleeding into the renal collecting system-with clot retention requiring insertion of three-way catheter and CBI.  Patient continued to have renal bleeding-on 9/8-IR performed embolization.    See below for further details.  Significant events: 9/1>> admit for evaluation of AKI/severe anemia. 9/6>> ultrasound-guided biopsy of right kidney 9/6>> developed hematuria followed by acute urinary retention-presyncope and hypotension post kidney biopsy.  Three-way Foley catheter placed and CBI started. 9/8>> hematuria-hemoglobin down to 6.4-IR performed embolization and TDC placement.  Significant studies: 9/1>> CXR: No pneumonia 9/1>> VQ scan: No PE 9/1>> CT renal stone study: No hydronephrosis, distended bladder.  Diverticulosis. 9/1>> FOBT: Negative 9/1>> UA: Protein>> 300, RBCs 11-20/hpf 9/1>> HIV: Nonreactive 9/1>> HBsAg/HCV Ab: Nonreactive 9/1>> C3/C4: Normal limits 9/1>> glomerular basement Ab: Negative 9/1>> ANA: Negative 9/1>> dsDNA: Negative 9/1>> ANCA titers: Negative 9/1>> SPEP: no M  spike 9/2>> Echo: EF 37-85%, grade 1 diastolic dysfunction. 9/4>>Folic acid :8.8(FOY) 7/7>>AJO B12:298 9/6>> CT abdomen/pelvis: Tiny perinephric hematoma, bleeding into the right renal collecting system 9/8>> CT abdomen/pelvis: Substantial clot burden within the bladder-small perinephric  hematoma   Antimicrobial therapy: None  Microbiology data: 9/1>> COVID PCR: Negative  Procedures : 9/06>> ultrasound-guided biopsy of right kidney 9/8>> 1) Right IJ temporary hemodialysis catheter placement 2) Right renal angiogram 3) Selective catheterization and embolization of right inferior pole arcuate artery  Consults: Nephrology, IR, urology  DVT Prophylaxis : Place and maintain sequential compression device Start: 09/01/21 1335 Place and maintain sequential compression device Start: 08/24/21 0519   Subjective:  Still with significant hematuria, but passing urine with no evidence of retention.   Assessment/Plan:  Acute kidney injury:  Suspicion for glomerular pathology-extensive serological work-up negative-awaiting results of renal biopsy.  Creatinine had stabilized to around 5-however due to bleeding post biopsy/blood loss anemia-creatinine is slowly uptrending-however no indications for RRT yet.  Nephrology following-dialysis catheter placed by IR yesterday in case patient requires HD (concern for further worsening of AKI-contrast nephropathy with embolization procedure).  Intake/Output Summary (Last 24 hours) at 09/01/2021 1337 Last data filed at 09/01/2021 0320 Gross per 24 hour  Intake 1601.66 ml  Output 1700 ml  Net -98.34 ml    Hypotension/presyncope-hematuria-acute urinary retention (clot retention) due to bleeding into the collecting system of the right kidney post renal biopsy on 9/6-requiring three-way Foley catheter insertion and CBI and embolization by IR on 9/8 :  -Management per urology, Foley catheter discontinued 9/8, he does remain with some hematuria, but still passing urine, will continue to monitor for signs of retention closely , continue to monitor with bladder scan . -Trend CBC closely and transfuse as needed.     Microcytic anemia: Multifactorial anemia-from AKI/acute illness/vitamin B12 and folate deficiency-but lately due to blood loss  post kidney biopsy.  Has required several units of PRBC transfusion-hemoglobin again downtrending today-repeat CBC this afternoon.  Minimally elevated troponins: Trend is flat-not consistent with ACS-he does  not have any chest pain.  Exertional dyspnea was from symptomatic anemia.  Suspect elevated troponin is in the setting of kidney failure.  Echo without any wall motion malady and preserved EF.  Doubt any further work-up is required-especially in light of severity of anemia and AKI.  Anxiety: Continue Xanax while he is in the hospital-is aware that we will likely not continue Xanax post discharge.  Use trazodone for sleep.  Patient is not interested in starting long-acting medications like SSRI for his anxiety-thinks that once he gets home-he will not require any further medications for his anxiety issues.  Tobacco abuse: Continue transdermal nicotine. Diet: Diet Order             Diet regular Room service appropriate? Yes; Fluid consistency: Thin  Diet effective now                    Code Status: Full code   Family Communication: Daughter-in-law at bedside on 9/7-none at bedside on 9/10.  Disposition Plan: Status is: Inpatient  Remains inpatient appropriate because:Inpatient level of care appropriate due to severity of illness  Dispo: The patient is from: Home              Anticipated d/c is to: Home              Patient currently is not medically stable to d/c.   Difficult to place patient No     Barriers to Discharge: Severe AKI-renal biopsy-with bleeding-and acute urinary retention   Antimicrobial agents: Anti-infectives (From admission, onward)    None        MEDICATIONS: Scheduled Meds:  Chlorhexidine Gluconate Cloth  6 each Topical Daily   cyanocobalamin  1,000 mcg Subcutaneous [id number]   folic acid  2 mg Oral Daily   melatonin  5 mg Oral QHS   nicotine  14 mg Transdermal Q24H   pantoprazole  40 mg Oral BID AC   sodium bicarbonate  1,300 mg Oral TID    tamsulosin  0.4 mg Oral Daily   Continuous Infusions:  sodium chloride     sodium chloride 10 mL/hr at 08/27/21 2147   lactated ringers 75 mL/hr at 09/01/21 0816   sodium chloride irrigation     PRN Meds:.acetaminophen, ALPRAZolam, bisacodyl, diphenhydrAMINE, heparin sodium (porcine), HYDROmorphone (DILAUDID) injection, lidocaine, midazolam, midazolam, ondansetron **OR** ondansetron (ZOFRAN) IV, promethazine, technetium albumin aggregated, traZODone   PHYSICAL EXAM: Vital signs: Vitals:   08/31/21 1436 08/31/21 1458 09/01/21 0612 09/01/21 0818  BP:   134/74   Pulse:    78  Resp: 14  16   Temp:  98.2 F (36.8 C) 98.5 F (36.9 C) 98.6 F (37 C)  TempSrc:  Oral Oral Oral  SpO2:   93% 92%  Weight:      Height:       Filed Weights   08/23/21 0816  Weight: 81.6 kg   Body mass index is 25.83 kg/m.   Awake Alert, Oriented X 3, No new F.N deficits, Normal affect Symmetrical Chest wall movement, Good air movement bilaterally, CTAB RRR,No Gallops,Rubs or new Murmurs, No Parasternal Heave +ve B.Sounds, Abd Soft, No tenderness, No rebound - guarding or rigidity. No Cyanosis, Clubbing or edema, No new Rash or bruise    I have personally reviewed following labs and imaging studies  LABORATORY DATA: CBC: Recent Labs  Lab 08/28/21 0305 08/28/21 1617 08/29/21 0040 08/29/21 0637 08/30/21 0241 08/30/21 1556 08/31/21 0230 08/31/21 1529  WBC 9.3   < > 13.1*  12.3* 12.8*  --  11.2* 11.5*  NEUTROABS 5.8  --   --   --   --   --   --   --   HGB 8.4*   < > 7.1* 7.4* 6.4* 7.4* 7.1* 7.6*  HCT 26.8*   < > 22.4* 24.1* 20.8* 23.2* 22.5* 24.5*  MCV 82.2   < > 83.6 84.6 85.2  --  84.3 85.1  PLT 263   < > 246 236 239  --  226 246   < > = values in this interval not displayed.    Basic Metabolic Panel: Recent Labs  Lab 08/28/21 0305 08/29/21 0040 08/30/21 0241 08/31/21 0230 09/01/21 0212  NA 138 137 136 139 138  K 3.6 4.1 3.8 4.1 3.9  CL 109 109 104 106 104  CO2 21* 20* 23 23  23   GLUCOSE 93 113* 86 106* 103*  BUN 33* 34* 35* 34* 36*  CREATININE 5.03* 5.06* 5.56* 5.93* 5.98*  CALCIUM 8.3* 8.1* 8.3* 8.3* 8.2*  PHOS 4.7* 6.5* 5.2* 5.8* 4.5    GFR: Estimated Creatinine Clearance: 11.7 mL/min (A) (by C-G formula based on SCr of 5.98 mg/dL (H)).  Liver Function Tests: Recent Labs  Lab 08/26/21 1036 08/27/21 0029 08/28/21 0305 08/29/21 0040 08/30/21 0241 08/31/21 0230 09/01/21 0212  AST 14*  --   --   --   --   --   --   ALT 9  --   --   --   --   --   --   ALKPHOS 56  --   --   --   --   --   --   BILITOT 0.8  --   --   --   --   --   --   PROT 5.0*  --   --   --   --   --   --   ALBUMIN 1.9*   < > 1.8* 1.6* 1.6* 1.7* 2.0*   < > = values in this interval not displayed.   No results for input(s): LIPASE, AMYLASE in the last 168 hours.  No results for input(s): AMMONIA in the last 168 hours.  Coagulation Profile: Recent Labs  Lab 08/28/21 0305 08/29/21 0637 08/30/21 0241  INR 1.5* 1.7* 1.5*    Cardiac Enzymes: No results for input(s): CKTOTAL, CKMB, CKMBINDEX, TROPONINI in the last 168 hours.  BNP (last 3 results) No results for input(s): PROBNP in the last 8760 hours.  Lipid Profile: No results for input(s): CHOL, HDL, LDLCALC, TRIG, CHOLHDL, LDLDIRECT in the last 72 hours.  Thyroid Function Tests: No results for input(s): TSH, T4TOTAL, FREET4, T3FREE, THYROIDAB in the last 72 hours.  Anemia Panel: No results for input(s): VITAMINB12, FOLATE, FERRITIN, TIBC, IRON, RETICCTPCT in the last 72 hours.   Urine analysis:    Component Value Date/Time   COLORURINE STRAW (A) 08/23/2021 1140   APPEARANCEUR CLEAR 08/23/2021 1140   LABSPEC 1.008 08/23/2021 1140   PHURINE 6.0 08/23/2021 1140   GLUCOSEU 50 (A) 08/23/2021 1140   HGBUR SMALL (A) 08/23/2021 1140   BILIRUBINUR NEGATIVE 08/23/2021 1140   KETONESUR NEGATIVE 08/23/2021 1140   PROTEINUR >=300 (A) 08/23/2021 1140   UROBILINOGEN 0.2 06/26/2007 1023   NITRITE NEGATIVE 08/23/2021 1140    LEUKOCYTESUR NEGATIVE 08/23/2021 1140    Sepsis Labs: Lactic Acid, Venous No results found for: LATICACIDVEN  MICROBIOLOGY: Recent Results (from the past 240 hour(s))  SARS CORONAVIRUS 2 (TAT 6-24 HRS) Nasopharyngeal Nasopharyngeal Swab  Status: None   Collection Time: 08/23/21  5:11 PM   Specimen: Nasopharyngeal Swab  Result Value Ref Range Status   SARS Coronavirus 2 NEGATIVE NEGATIVE Final    Comment: (NOTE) SARS-CoV-2 target nucleic acids are NOT DETECTED.  The SARS-CoV-2 RNA is generally detectable in upper and lower respiratory specimens during the acute phase of infection. Negative results do not preclude SARS-CoV-2 infection, do not rule out co-infections with other pathogens, and should not be used as the sole basis for treatment or other patient management decisions. Negative results must be combined with clinical observations, patient history, and epidemiological information. The expected result is Negative.  Fact Sheet for Patients: SugarRoll.be  Fact Sheet for Healthcare Providers: https://www.woods-mathews.com/  This test is not yet approved or cleared by the [place] FDA and  has been authorized for detection and/or diagnosis of SARS-CoV-2 by FDA under an Emergency Use Authorization (EUA). This EUA will remain  in effect (meaning this test can be used) for the duration of the COVID-19 declaration under Se ction 564(b)(1) of the Act, 21 U.S.C. section 360bbb-3(b)(1), unless the authorization is terminated or revoked sooner.  Performed at [facility], [place] [address] [place], [place] [address]     RADIOLOGY STUDIES/RESULTS: CT ABDOMEN PELVIS WO CONTRAST  Result Date: 08/30/2021 CLINICAL DATA:  Retroperitoneal hematoma, follow up s/p random renal bx with perinephric hematoma development EXAM: CT ABDOMEN AND PELVIS WITHOUT CONTRAST TECHNIQUE: Multidetector CT imaging of the abdomen and pelvis was  performed following the standard protocol without IV contrast. COMPARISON:  September 6 FINDINGS: Inferior chest: Trace bilateral pleural effusions with right greater than left compressive subsegmental atelectasis. Hepatobiliary: The liver is normal in size without focal abnormality. No intrahepatic or extrahepatic biliary ductal dilation. The gallbladder appears normal. Spleen: Normal in size without focal abnormality. Pancreas: No pancreatic ductal dilatation or surrounding inflammatory changes. Adrenals/Urinary Tract: Adrenal glands are unremarkable. The kidneys are normal in size. Tiny perinephric hematoma along the right renal lower pole is unchanged. Hyperdense material in the right collecting system and bladder consistent with blood products, grossly similar. Stomach/Bowel: The stomach, small bowel and large bowel are normal in caliber without abnormal wall thickening or surrounding inflammatory changes. Reproductive: Prostate is unremarkable. Lymphatic: No enlarged lymph nodes in the abdomen or pelvis. Vasculature: The abdominal aorta is normal in caliber. Aortic atherosclerosis. Other: No abdominopelvic ascites. Musculoskeletal: No aggressive osseous lesions. Degenerative changes at L5-S1. Bone island in the left ilium. IMPRESSION: The small perinephric hematoma along the right renal lower pole is stable, and within expected limits after percutaneous biopsy. However, there remains substantial clot burden within the bladder. Follow-up urology recommendations for management. Electronically Signed   By: Albin Felling M.D.   On: 08/30/2021 14:21   IR Angiogram Renal Left Selective  INDICATION: 70 year old male with history of acute kidney injury of uncertain etiology status post ultrasound-guided right renal biopsy on 08/28/2021. Since biopsy, the patient has experienced gross hematuria with associated acute anemia.   EXAM: 1. Ultrasound-guided vascular access of the right internal jugular vein. 2.  Temporary hemodialysis catheter placement. 3. Ultrasound-guided vascular access of the right common femoral artery. 4. Selective catheterization and angiography of the right renal artery. 5. Sub selective catheterization angiography of right inferior polar and arcuate artery branches. 6. Coil embolization of right inferior pole arcuate artery branch.   MEDICATIONS: None.   ANESTHESIA/SEDATION: Moderate (conscious) sedation was employed during this procedure. A total of Versed 3 mg and Fentanyl 50 mcg was administered intravenously.  Moderate Sedation Time: 66 minutes. The patient's level of consciousness and vital signs were monitored continuously by radiology nursing throughout the procedure under my direct supervision.   CONTRAST:  30mL OMNIPAQUE IOHEXOL 350 MG/ML SOLN, 60mL OMNIPAQUE IOHEXOL 350 MG/ML SOLN   FLUOROSCOPY TIME:  Fluoroscopy Time: 10.6 minutes, (811 mGy).   COMPLICATIONS: None immediate.   PROCEDURE: Informed consent was obtained from the patient following explanation of the procedure, risks, benefits and alternatives. The patient understands, agrees and consents for the procedure. All questions were addressed. A time out was performed prior to the initiation of the procedure. Maximal barrier sterile technique utilized including caps, mask, sterile gowns, sterile gloves, large sterile drape, hand hygiene, and chlorhexidine prep.   Preprocedure ultrasound evaluation of the right internal jugular vein demonstrated a patent and compressible vein free of internal echoes. Procedure was planned. Subdermal Local anesthesia was provided 1% lidocaine. A small skin nick was made. Under direct ultrasound visualization, a 21 gauge micropuncture needle was directed into the internal jugular vein. An image was captured and stored in the permanent record. A micropuncture set was inserted and exchanged for a J wire which was positioned in the inferior vena cava. Serial dilation was performed followed by placement  of a 12.5 French, 24 cm Trialysis catheter. The catheter tip was positioned in the right atrium. Each lumen flushed and aspirated appropriately. The dialysis ports were locked with appropriate volume of heparin dwell. The middle central venous port was then used for sedation for the remainder of the procedure. The catheter was secured with a 0 silk retention suture. A sterile bandage was applied.   Preprocedure ultrasound evaluation of the right groin was performed which demonstrated a patent right common femoral artery. The procedure was planned. Subdermal Local anesthesia was provided with 1% lidocaine. A small skin nick was made. Under direct ultrasound visualization, a 21 gauge micropuncture needle was directed into the common femoral artery. An ultrasound image was captured and stored in the permanent record. A micro puncture set was inserted and a limited right lower extremity angiogram was performed which demonstrated appropriate puncture site for closure device use. A J wire was directed to the abdominal aorta and the micropuncture set was exchanged for a 5 [place] vascular sheath. A 5 French C2 catheter was then directed into the right renal ostium. Right renal angiogram was performed. The single main renal artery was patent. 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   A straight lantern microcatheter and 0.014" soft synchro wire was then inserted and directed into the inferior polar branch. Repeat angiogram was performed in the sub selective location which was significant for multifocal 2-4 mm pseudoaneurysm formation, abnormal truncation and irregularity of the arcuate and interlobular branches, an early arteriovenous shunting. The inferior polar arcuate branch was selected further. Coil embolization  was performed with multiple low profile Penumbra Ruby coils ranging from 2-3 mm in diameter. Completion right inferior renal angiogram was performed which demonstrated appropriate embolization of the targeted vessels without persistent arteriovenous fistula or vessel irregularity.   The catheters were removed. The right common femoral artery was then closed with a 6 [place] Angio-Seal device. Distal pulses were unchanged. The patient tolerated the procedure well was transferred back to the floor in good condition.   IMPRESSION: 1. 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 2. Sub selective coil embolization of right inferior polar arcuate artery branch. 3. Successful placement of right internal jugular, 24 French Trialysis catheter with the catheter tip in the right atrium. The catheter is ready for immediate use.   Ruthann Cancer, MD   [facility]   [facility] Radiology     Electronically Signed   By: Ruthann Cancer M.D.   On: 08/31/2021 09:29    IR Fluoro Guide CV Line Right  Result Date: 08/31/2021 INDICATION: 70 year old male with history of acute kidney injury of uncertain etiology status post ultrasound-guided right renal biopsy on 08/28/2021. Since biopsy, the patient has experienced gross hematuria with associated acute anemia. EXAM: 1. Ultrasound-guided vascular access of the right internal jugular vein. 2. Temporary hemodialysis catheter placement. 3. Ultrasound-guided vascular access of the right common femoral artery. 4. Selective catheterization and angiography of the right renal artery. 5. Sub selective catheterization angiography of right inferior polar and arcuate artery branches. 6. Coil embolization of right inferior pole arcuate artery branch. MEDICATIONS: None. ANESTHESIA/SEDATION: Moderate (conscious) sedation was  employed during this procedure. A total of Versed 3 mg and Fentanyl 50 mcg was administered intravenously. Moderate Sedation Time: 66 minutes. The patient's level of consciousness and vital signs were monitored continuously by radiology nursing throughout the procedure under my direct supervision. CONTRAST:  49mL OMNIPAQUE IOHEXOL 350 MG/ML SOLN, 74mL OMNIPAQUE IOHEXOL 350 MG/ML SOLN FLUOROSCOPY TIME:  Fluoroscopy Time: 10.6 minutes, (811 mGy). COMPLICATIONS: None immediate. PROCEDURE: Informed consent was obtained from the patient following explanation of the procedure, risks, benefits and alternatives. The patient understands, agrees and consents for the procedure. All questions were addressed. A time out was performed prior to the initiation of the procedure. Maximal barrier sterile technique utilized including caps, mask, sterile gowns, sterile gloves, large sterile drape, hand hygiene, and chlorhexidine prep. Preprocedure ultrasound evaluation of the right internal jugular vein demonstrated a patent and compressible vein free of internal echoes. Procedure was planned. Subdermal Local anesthesia was provided 1% lidocaine. A small skin nick was made. Under direct ultrasound visualization, a 21 gauge micropuncture needle was directed into the internal jugular vein. An image was captured and stored in the permanent record. A micropuncture set was inserted and exchanged for a J wire which was positioned in the inferior vena cava. Serial dilation was performed followed by placement of a 12.5 French, 24 cm Trialysis catheter. The catheter tip was positioned in the right atrium. Each lumen flushed and aspirated appropriately. The dialysis ports were locked with appropriate volume of heparin dwell. The middle central venous port was then used for sedation for the remainder of the procedure. The catheter was secured with a 0 silk retention suture. A sterile bandage was applied. Preprocedure ultrasound evaluation of the  right groin was performed which demonstrated a patent right common femoral artery. The procedure was planned. Subdermal Local anesthesia was provided with 1% lidocaine. A small skin nick was made. Under direct ultrasound visualization, a 21 gauge micropuncture needle was directed into the common femoral artery. An ultrasound image was captured and stored in the permanent record. A micro puncture set was inserted and a limited right lower extremity angiogram was performed which demonstrated appropriate puncture site for closure device use. A J wire was directed to the abdominal aorta and the micropuncture set was exchanged for a 5 [place] vascular sheath. A 5 French C2 catheter was then  directed into the right renal ostium. Right renal angiogram was performed. The single main renal artery was patent. 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 A straight lantern microcatheter and 0.014" soft synchro wire was then inserted and directed into the inferior polar branch. Repeat angiogram was performed in the sub selective location which was significant for multifocal 2-4 mm pseudoaneurysm formation, abnormal truncation and irregularity of the arcuate and interlobular branches, an early arteriovenous shunting. The inferior polar arcuate branch was selected further. Coil embolization was performed with multiple low profile Penumbra Ruby coils ranging from 2-3 mm in diameter. Completion right inferior renal angiogram was performed which demonstrated appropriate embolization of the targeted vessels without persistent arteriovenous fistula or vessel irregularity. The catheters were removed. The right common femoral artery was then closed with a 6 [place] Angio-Seal device. Distal pulses were unchanged. The patient tolerated  the procedure well was transferred back to the floor in good condition. IMPRESSION: 1. 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 2. Sub selective coil embolization of right inferior polar arcuate artery branch. 3. Successful placement of right internal jugular, 24 French Trialysis catheter with the catheter tip in the right atrium. The catheter is ready for immediate use. Ruthann Cancer, MD [facility] Radiology Electronically Signed   By: Ruthann Cancer M.D.   On: 08/31/2021 09:29   IR US Guide Vasc Access Right  Result Date: 08/31/2021 INDICATION: 70 year old male with history of acute kidney injury of uncertain etiology status post ultrasound-guided right renal biopsy on 08/28/2021. Since biopsy, the patient has experienced gross hematuria with associated acute anemia. EXAM: 1. Ultrasound-guided vascular access of the right internal jugular vein. 2. Temporary hemodialysis catheter placement. 3. Ultrasound-guided vascular access of the right common femoral artery. 4. Selective catheterization and angiography of the right renal artery. 5. Sub selective catheterization angiography of right inferior polar and arcuate artery branches. 6. Coil embolization of right inferior pole arcuate artery branch. MEDICATIONS: None. ANESTHESIA/SEDATION: Moderate (conscious) sedation was employed during this procedure. A total of Versed 3 mg and Fentanyl 50 mcg was administered intravenously. Moderate Sedation Time: 66 minutes. The patient's level of consciousness and vital signs were monitored continuously by radiology nursing throughout the procedure under my direct supervision. CONTRAST:  60mL OMNIPAQUE IOHEXOL 350 MG/ML SOLN, 68mL OMNIPAQUE IOHEXOL 350 MG/ML SOLN FLUOROSCOPY TIME:  Fluoroscopy Time: 10.6 minutes, (811 mGy).  COMPLICATIONS: None immediate. PROCEDURE: Informed consent was obtained from the patient following explanation of the procedure, risks, benefits and alternatives. The patient understands, agrees and consents for the procedure. All questions were addressed. A time out was performed prior to the initiation of the procedure. Maximal barrier sterile technique utilized including caps, mask, sterile gowns, sterile gloves, large sterile drape, hand hygiene, and chlorhexidine prep. Preprocedure ultrasound evaluation of the right internal jugular vein demonstrated a patent and compressible vein free of internal echoes. Procedure was planned. Subdermal Local anesthesia was provided 1% lidocaine. A small skin nick was made. Under direct ultrasound visualization, a 21 gauge micropuncture needle was directed into the internal jugular vein. An image was captured and stored in the permanent record. A micropuncture set was inserted and exchanged for a J wire which was positioned in the  inferior vena cava. Serial dilation was performed followed by placement of a 12.5 French, 24 cm Trialysis catheter. The catheter tip was positioned in the right atrium. Each lumen flushed and aspirated appropriately. The dialysis ports were locked with appropriate volume of heparin dwell. The middle central venous port was then used for sedation for the remainder of the procedure. The catheter was secured with a 0 silk retention suture. A sterile bandage was applied. Preprocedure ultrasound evaluation of the right groin was performed which demonstrated a patent right common femoral artery. The procedure was planned. Subdermal Local anesthesia was provided with 1% lidocaine. A small skin nick was made. Under direct ultrasound visualization, a 21 gauge micropuncture needle was directed into the common femoral artery. An ultrasound image was captured and stored in the permanent record. A micro puncture set was inserted and a limited right lower  extremity angiogram was performed which demonstrated appropriate puncture site for closure device use. A J wire was directed to the abdominal aorta and the micropuncture set was exchanged for a 5 [place] vascular sheath. A 5 French C2 catheter was then directed into the right renal ostium. Right renal angiogram was performed. The single main renal artery was patent. 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 A straight lantern microcatheter and 0.014" soft synchro wire was then inserted and directed into the inferior polar branch. Repeat angiogram was performed in the sub selective location which was significant for multifocal 2-4 mm pseudoaneurysm formation, abnormal truncation and irregularity of the arcuate and interlobular branches, an early arteriovenous shunting. The inferior polar arcuate branch was selected further. Coil embolization was performed with multiple low profile Penumbra Ruby coils ranging from 2-3 mm in diameter. Completion right inferior renal angiogram was performed which demonstrated appropriate embolization of the targeted vessels without persistent arteriovenous fistula or vessel irregularity. The catheters were removed. The right common femoral artery was then closed with a 6 [place] Angio-Seal device. Distal pulses were unchanged. The patient tolerated the procedure well was transferred back to the floor in good condition. IMPRESSION: 1. 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 2. Sub selective coil embolization of right inferior polar arcuate artery branch. 3. Successful placement of right internal jugular, 24 French Trialysis catheter  with the catheter tip in the right atrium. The catheter is ready for immediate use. Ruthann Cancer, MD [facility] Radiology Electronically Signed   By: Ruthann Cancer M.D.   On: 08/31/2021 09:29   IR US Guide Vasc Access Right  Result Date: 08/31/2021 INDICATION: 70 year old male with history of acute kidney injury of uncertain etiology status post ultrasound-guided right renal biopsy on 08/28/2021. Since biopsy, the patient has experienced gross hematuria with associated acute anemia. EXAM: 1. Ultrasound-guided vascular access of the right internal jugular vein. 2. Temporary hemodialysis catheter placement. 3. Ultrasound-guided vascular access of the right common femoral artery. 4. Selective catheterization and angiography of the right renal artery. 5. Sub selective catheterization angiography of right inferior polar and arcuate artery branches. 6. Coil embolization of right inferior pole arcuate artery branch. MEDICATIONS: None. ANESTHESIA/SEDATION: Moderate (conscious) sedation was employed during this procedure. A total of Versed 3 mg and Fentanyl 50 mcg was administered intravenously. Moderate Sedation  Time: 66 minutes. The patient's level of consciousness and vital signs were monitored continuously by radiology nursing throughout the procedure under my direct supervision. CONTRAST:  66m OMNIPAQUE IOHEXOL 350 MG/ML SOLN, 510mOMNIPAQUE IOHEXOL 350 MG/ML SOLN FLUOROSCOPY TIME:  Fluoroscopy Time: 10.6 minutes, (811 mGy). COMPLICATIONS: None immediate. PROCEDURE: Informed consent was obtained from the patient following explanation of the procedure, risks, benefits and alternatives. The patient understands, agrees and consents for the procedure. All questions were addressed. A time out was performed prior to the initiation of the procedure. Maximal barrier sterile technique utilized including caps, mask, sterile gowns, sterile gloves, large sterile drape, hand  hygiene, and chlorhexidine prep. Preprocedure ultrasound evaluation of the right internal jugular vein demonstrated a patent and compressible vein free of internal echoes. Procedure was planned. Subdermal Local anesthesia was provided 1% lidocaine. A small skin nick was made. Under direct ultrasound visualization, a 21 gauge micropuncture needle was directed into the internal jugular vein. An image was captured and stored in the permanent record. A micropuncture set was inserted and exchanged for a J wire which was positioned in the inferior vena cava. Serial dilation was performed followed by placement of a 12.5 French, 24 cm Trialysis catheter. The catheter tip was positioned in the right atrium. Each lumen flushed and aspirated appropriately. The dialysis ports were locked with appropriate volume of heparin dwell. The middle central venous port was then used for sedation for the remainder of the procedure. The catheter was secured with a 0 silk retention suture. A sterile bandage was applied. Preprocedure ultrasound evaluation of the right groin was performed which demonstrated a patent right common femoral artery. The procedure was planned. Subdermal Local anesthesia was provided with 1% lidocaine. A small skin nick was made. Under direct ultrasound visualization, a 21 gauge micropuncture needle was directed into the common femoral artery. An ultrasound image was captured and stored in the permanent record. A micro puncture set was inserted and a limited right lower extremity angiogram was performed which demonstrated appropriate puncture site for closure device use. A J wire was directed to the abdominal aorta and the micropuncture set was exchanged for a 5 [place] sheath. A 5 French C2 catheter was then directed into the right renal ostium. Right renal angiogram was performed. The single main renal artery was patent. About 2 arcuate artery branches in the inferior pole there is abnormal truncation and  vessel irregularity with evidence of an early filling arteriovenous fistula in addition to suggestion of faint filling into the collecting system on delayed imaging. A straight lantern microcatheter and 0.014" soft synchro wire was then inserted and directed into the inferior polar branch. Repeat angiogram was performed in the sub selective location which was significant for multifocal 2-4 mm pseudoaneurysm formation, abnormal truncation and irregularity of the arcuate and interlobular branches, an early arteriovenous shunting. The inferior polar arcuate branch was selected further. Coil embolization was performed with multiple low profile Penumbra Ruby coils ranging from 2-3 mm in diameter. Completion right inferior renal angiogram was performed which demonstrated appropriate embolization of the targeted vessels without persistent arteriovenous fistula or vessel irregularity. The catheters were removed. The right common femoral artery was then closed with a 6 [place]-Seal device. Distal pulses were unchanged. The patient tolerated the procedure well was transferred back to the floor in good condition. IMPRESSION: 1. 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 2. Sub  selective coil embolization of right inferior polar arcuate artery branch. 3. Successful placement of right internal jugular, 24 French Trialysis catheter with the catheter tip in the right atrium. The catheter is ready for immediate use. Ruthann Cancer, MD [facility] Radiology Electronically Signed   By: Ruthann Cancer M.D.   On: 08/31/2021 09:29   IR EMBO ART  VEN HEMORR LYMPH EXTRAV  INC GUIDE ROADMAPPING  Result Date: 08/31/2021 INDICATION: 71 year old male with history of acute kidney injury of uncertain etiology status post ultrasound-guided right  renal biopsy on 08/28/2021. Since biopsy, the patient has experienced gross hematuria with associated acute anemia. EXAM: 1. Ultrasound-guided vascular access of the right internal jugular vein. 2. Temporary hemodialysis catheter placement. 3. Ultrasound-guided vascular access of the right common femoral artery. 4. Selective catheterization and angiography of the right renal artery. 5. Sub selective catheterization angiography of right inferior polar and arcuate artery branches. 6. Coil embolization of right inferior pole arcuate artery branch. MEDICATIONS: None. ANESTHESIA/SEDATION: Moderate (conscious) sedation was employed during this procedure. A total of Versed 3 mg and Fentanyl 50 mcg was administered intravenously. Moderate Sedation Time: 66 minutes. The patient's level of consciousness and vital signs were monitored continuously by radiology nursing throughout the procedure under my direct supervision. CONTRAST:  25mL OMNIPAQUE IOHEXOL 350 MG/ML SOLN, 57mL OMNIPAQUE IOHEXOL 350 MG/ML SOLN FLUOROSCOPY TIME:  Fluoroscopy Time: 10.6 minutes, (811 mGy). COMPLICATIONS: None immediate. PROCEDURE: Informed consent was obtained from the patient following explanation of the procedure, risks, benefits and alternatives. The patient understands, agrees and consents for the procedure. All questions were addressed. A time out was performed prior to the initiation of the procedure. Maximal barrier sterile technique utilized including caps, mask, sterile gowns, sterile gloves, large sterile drape, hand hygiene, and chlorhexidine prep. Preprocedure ultrasound evaluation of the right internal jugular vein demonstrated a patent and compressible vein free of internal echoes. Procedure was planned. Subdermal Local anesthesia was provided 1% lidocaine. A small skin nick was made. Under direct ultrasound visualization, a 21 gauge micropuncture needle was directed into the internal jugular vein. An image was captured and stored in  the permanent record. A micropuncture set was inserted and exchanged for a J wire which was positioned in the inferior vena cava. Serial dilation was performed followed by placement of a 12.5 French, 24 cm Trialysis catheter. The catheter tip was positioned in the right atrium. Each lumen flushed and aspirated appropriately. The dialysis ports were locked with appropriate volume of heparin dwell. The middle central venous port was then used for sedation for the remainder of the procedure. The catheter was secured with a 0 silk retention suture. A sterile bandage was applied. Preprocedure ultrasound evaluation of the right groin was performed which demonstrated a patent right common femoral artery. The procedure was planned. Subdermal Local anesthesia was provided with 1% lidocaine. A small skin nick was made. Under direct ultrasound visualization, a 21 gauge micropuncture needle was directed into the common femoral artery. An ultrasound image was captured and stored in the permanent record. A micro puncture set was inserted and a limited right lower extremity angiogram was performed which demonstrated appropriate puncture site for closure device use. A J wire was directed to the abdominal aorta and the micropuncture set was exchanged for a 5 [place] vascular sheath. A 5 French C2 catheter was then directed into the right renal ostium. Right renal angiogram was performed. The single main renal artery was patent. About 2 arcuate artery branches in the  inferior pole there is abnormal truncation and vessel irregularity with evidence of an early filling arteriovenous fistula in addition to suggestion of faint filling into the collecting system on delayed imaging. A straight lantern microcatheter and 0.014" soft synchro wire was then inserted and directed into the inferior polar branch. Repeat angiogram was performed in the sub selective location which was significant for multifocal 2-4 mm pseudoaneurysm formation,  abnormal truncation and irregularity of the arcuate and interlobular branches, an early arteriovenous shunting. The inferior polar arcuate branch was selected further. Coil embolization was performed with multiple low profile Penumbra Ruby coils ranging from 2-3 mm in diameter. Completion right inferior renal angiogram was performed which demonstrated appropriate embolization of the targeted vessels without persistent arteriovenous fistula or vessel irregularity. The catheters were removed. The right common femoral artery was then closed with a 6 [place] Angio-Seal device. Distal pulses were unchanged. The patient tolerated the procedure well was transferred back to the floor in good condition. IMPRESSION: 1. 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 2. Sub selective coil embolization of right inferior polar arcuate artery branch. 3. Successful placement of right internal jugular, 24 French Trialysis catheter with the catheter tip in the right atrium. The catheter is ready for immediate use. Ruthann Cancer, MD [facility] Radiology Electronically Signed   By: Ruthann Cancer M.D.   On: 08/31/2021 09:29     LOS: 9 days   Phillips Climes, MD  [facility]    To contact the attending provider between 7A-7P or the covering provider during after hours 7P-7A, please log into the web site [email or web address] and access using universal [place] password for that web site. If you do not have the password, please call the hospital operator.  09/01/2021, 1:37 PM

## 2021-09-01 NOTE — Progress Notes (Signed)
Discussed bladder scan results with Dr. Waldron Labs. Will monitor patient for now, notify provider if patient reports any pain or feels like his bladder is full or if he is not urinating.  Patient voiding red bloody urine in amounts of 300-400 mls at a time and has no discomfort in abdomen or pelvic area.  Plan discussed with patient and he agrees to notify staff if he is unable to urinate or has any sensations of a full bladder or discomfort.

## 2021-09-01 NOTE — Progress Notes (Signed)
[facility] NEPHROLOGY PROGRESS NOTE  Assessment/ Plan:  #Acute kidney injury, nonoliguric, with history of excessive NSAID's use/BC powders every day.  Peaked creatinine level of 6.95.  CT scan without obstruction or hydronephrosis.  UA with more than 300 protein and microscopic hematuria.  Serology evaluation including ANA, ANCA, C3, C4, SPEP, anti-GBM, double-stranded DNA, hep B, hep C, HIV negative.   He underwent IR guided kidney biopsy on 9/6 complicated by postbiopsy bleeding. Awaiting bx result, hopefully it will be back early next week.  He is nonoliguric and creatinine level stable around 5.9 today.  No uremic feature.  He had right IJ temporary HD catheter placed on 9/8 in IR during embolization of the bleeding artery.  No need for dialysis today and continue to watch for renal recovery.  We will check daily lab, strict ins and out.  On gentle IV hydration.  #Post kidney biopsy bleeding, syncope/acute blood loss anemia: He initially received continuous bladder irrigation.  Because of ongoing bleeding, he underwent embolization of right inferior pole arcuate artery and right IJ temporary HD catheter placement by IR on 9/8.  Still having some hematuria, not on CBI at the moment, urology is following; defer to them.  Monitor CBC.  #Anemia, complicated by acute blood loss post kidney biopsy: Urology and IR is following.  Received blood transfusion.  Monitor hemoglobin.  On admission FOBT was negative and treated with IV iron.  #History of nephrolithiasis: Repeat CT scan without any obstruction or hydronephrosis.  Urology is following  #Metabolic acidosis: Continue oral sodium bicarbonate, stable CO2 level.  Subjective: Seen and examined.  No overnight event.  Urine output is around 2.3 L.  Continue to have some hematuria.  He feels thirsty and asking for water and food.  No chest pain or shortness of breath. Objective Vital signs in last 24 hours: Vitals:   08/31/21 1436  08/31/21 1458 09/01/21 0612 09/01/21 0818  BP:   134/74   Pulse:    78  Resp: 14  16   Temp:  98.2 F (36.8 C) 98.5 F (36.9 C) 98.6 F (37 C)  TempSrc:  Oral Oral Oral  SpO2:   93% 92%  Weight:      Height:       Weight change:   Intake/Output Summary (Last 24 hours) at 09/01/2021 0958 Last data filed at 09/01/2021 0320 Gross per 24 hour  Intake 2561.66 ml  Output 2250 ml  Net 311.66 ml        Labs: Basic Metabolic Panel: Recent Labs  Lab 08/30/21 0241 08/31/21 0230 09/01/21 0212  NA 136 139 138  K 3.8 4.1 3.9  CL 104 106 104  CO2 23 23 23   GLUCOSE 86 106* 103*  BUN 35* 34* 36*  CREATININE 5.56* 5.93* 5.98*  CALCIUM 8.3* 8.3* 8.2*  PHOS 5.2* 5.8* 4.5    Liver Function Tests: Recent Labs  Lab 08/26/21 1036 08/27/21 0029 08/30/21 0241 08/31/21 0230 09/01/21 0212  AST 14*  --   --   --   --   ALT 9  --   --   --   --   ALKPHOS 56  --   --   --   --   BILITOT 0.8  --   --   --   --   PROT 5.0*  --   --   --   --   ALBUMIN 1.9*   < > 1.6* 1.7* 2.0*   < > = values in  this interval not displayed.    No results for input(s): LIPASE, AMYLASE in the last 168 hours.  No results for input(s): AMMONIA in the last 168 hours. CBC: Recent Labs  Lab 08/28/21 0305 08/28/21 1617 08/29/21 0040 08/29/21 0637 08/30/21 0241 08/30/21 1556 08/31/21 0230 08/31/21 1529  WBC 9.3   < > 13.1* 12.3* 12.8*  --  11.2* 11.5*  NEUTROABS 5.8  --   --   --   --   --   --   --   HGB 8.4*   < > 7.1* 7.4* 6.4* 7.4* 7.1* 7.6*  HCT 26.8*   < > 22.4* 24.1* 20.8* 23.2* 22.5* 24.5*  MCV 82.2   < > 83.6 84.6 85.2  --  84.3 85.1  PLT 263   < > 246 236 239  --  226 246   < > = values in this interval not displayed.    Cardiac Enzymes: No results for input(s): CKTOTAL, CKMB, CKMBINDEX, TROPONINI in the last 168 hours. CBG: No results for input(s): GLUCAP in the last 168 hours.  Iron Studies: No results for input(s): IRON, TIBC, TRANSFERRIN, FERRITIN in the last 72  hours. Studies/Results: CT ABDOMEN PELVIS WO CONTRAST  Result Date: 08/30/2021 CLINICAL DATA:  Retroperitoneal hematoma, follow up s/p random renal bx with perinephric hematoma development EXAM: CT ABDOMEN AND PELVIS WITHOUT CONTRAST TECHNIQUE: Multidetector CT imaging of the abdomen and pelvis was performed following the standard protocol without IV contrast. COMPARISON:  September 6 FINDINGS: Inferior chest: Trace bilateral pleural effusions with right greater than left compressive subsegmental atelectasis. Hepatobiliary: The liver is normal in size without focal abnormality. No intrahepatic or extrahepatic biliary ductal dilation. The gallbladder appears normal. Spleen: Normal in size without focal abnormality. Pancreas: No pancreatic ductal dilatation or surrounding inflammatory changes. Adrenals/Urinary Tract: Adrenal glands are unremarkable. The kidneys are normal in size. Tiny perinephric hematoma along the right renal lower pole is unchanged. Hyperdense material in the right collecting system and bladder consistent with blood products, grossly similar. Stomach/Bowel: The stomach, small bowel and large bowel are normal in caliber without abnormal wall thickening or surrounding inflammatory changes. Reproductive: Prostate is unremarkable. Lymphatic: No enlarged lymph nodes in the abdomen or pelvis. Vasculature: The abdominal aorta is normal in caliber. Aortic atherosclerosis. Other: No abdominopelvic ascites. Musculoskeletal: No aggressive osseous lesions. Degenerative changes at L5-S1. Bone island in the left ilium. IMPRESSION: The small perinephric hematoma along the right renal lower pole is stable, and within expected limits after percutaneous biopsy. However, there remains substantial clot burden within the bladder. Follow-up urology recommendations for management. Electronically Signed   By: Albin Felling M.D.   On: 08/30/2021 14:21   IR Angiogram Renal Left Selective  INDICATION: 70 year old  male with history of acute kidney injury of uncertain etiology status post ultrasound-guided right renal biopsy on 08/28/2021. Since biopsy, the patient has experienced gross hematuria with associated acute anemia.   EXAM: 1. Ultrasound-guided vascular access of the right internal jugular vein. 2. Temporary hemodialysis catheter placement. 3. Ultrasound-guided vascular access of the right common femoral artery. 4. Selective catheterization and angiography of the right renal artery. 5. Sub selective catheterization angiography of right inferior polar and arcuate artery branches. 6. Coil embolization of right inferior pole arcuate artery branch.   MEDICATIONS: None.   ANESTHESIA/SEDATION: Moderate (conscious) sedation was employed during this procedure. A total of Versed 3 mg and Fentanyl 50 mcg was administered intravenously.   Moderate Sedation Time: 66 minutes. The patient's level of consciousness and  vital signs were monitored continuously by radiology nursing throughout the procedure under my direct supervision.   CONTRAST:  60mL OMNIPAQUE IOHEXOL 350 MG/ML SOLN, 61mL OMNIPAQUE IOHEXOL 350 MG/ML SOLN   FLUOROSCOPY TIME:  Fluoroscopy Time: 10.6 minutes, (811 mGy).   COMPLICATIONS: None immediate.   PROCEDURE: Informed consent was obtained from the patient following explanation of the procedure, risks, benefits and alternatives. The patient understands, agrees and consents for the procedure. All questions were addressed. A time out was performed prior to the initiation of the procedure. Maximal barrier sterile technique utilized including caps, mask, sterile gowns, sterile gloves, large sterile drape, hand hygiene, and chlorhexidine prep.   Preprocedure ultrasound evaluation of the right internal jugular vein demonstrated a patent and compressible vein free of internal echoes. Procedure was planned. Subdermal Local anesthesia was provided 1% lidocaine. A small skin nick was made. Under direct ultrasound  visualization, a 21 gauge micropuncture needle was directed into the internal jugular vein. An image was captured and stored in the permanent record. A micropuncture set was inserted and exchanged for a J wire which was positioned in the inferior vena cava. Serial dilation was performed followed by placement of a 12.5 French, 24 cm Trialysis catheter. The catheter tip was positioned in the right atrium. Each lumen flushed and aspirated appropriately. The dialysis ports were locked with appropriate volume of heparin dwell. The middle central venous port was then used for sedation for the remainder of the procedure. The catheter was secured with a 0 silk retention suture. A sterile bandage was applied.   Preprocedure ultrasound evaluation of the right groin was performed which demonstrated a patent right common femoral artery. The procedure was planned. Subdermal Local anesthesia was provided with 1% lidocaine. A small skin nick was made. Under direct ultrasound visualization, a 21 gauge micropuncture needle was directed into the common femoral artery. An ultrasound image was captured and stored in the permanent record. A micro puncture set was inserted and a limited right lower extremity angiogram was performed which demonstrated appropriate puncture site for closure device use. A J wire was directed to the abdominal aorta and the micropuncture set was exchanged for a 5 [place] vascular sheath. A 5 French C2 catheter was then directed into the right renal ostium. Right renal angiogram was performed. The single main renal artery was patent. 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   A straight lantern microcatheter and 0.014" soft synchro wire was then inserted and directed into the inferior polar branch. Repeat  angiogram was performed in the sub selective location which was significant for multifocal 2-4 mm pseudoaneurysm formation, abnormal truncation and irregularity of the arcuate and interlobular branches, an early arteriovenous shunting. The inferior polar arcuate branch was selected further. Coil embolization was performed with multiple low profile Penumbra Ruby coils ranging from 2-3 mm in diameter. Completion right inferior renal angiogram was performed which demonstrated appropriate embolization of the targeted vessels without persistent arteriovenous fistula or vessel irregularity.   The catheters were removed. The right common femoral artery was then closed with a 6 [place] Angio-Seal device. Distal pulses were unchanged. The patient tolerated the procedure well was transferred back to the floor in good condition.   IMPRESSION: 1. 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 2. Sub selective coil embolization of right inferior polar arcuate artery branch. 3. Successful placement of right internal jugular, 24 French Trialysis catheter with the catheter tip in the right atrium. The catheter is ready for immediate use.   Ruthann Cancer, MD   [facility]   [facility] Radiology     Electronically Signed   By: Ruthann Cancer M.D.   On: 08/31/2021 09:29    IR Fluoro Guide CV Line Right  Result Date: 08/31/2021 INDICATION: 70 year old male with history of acute kidney injury of uncertain etiology status post ultrasound-guided right renal biopsy on 08/28/2021. Since biopsy, the patient has experienced gross hematuria with associated acute anemia. EXAM: 1. Ultrasound-guided vascular access of the right internal jugular vein. 2. Temporary hemodialysis catheter placement. 3. Ultrasound-guided vascular access of the right common femoral artery.  4. Selective catheterization and angiography of the right renal artery. 5. Sub selective catheterization angiography of right inferior polar and arcuate artery branches. 6. Coil embolization of right inferior pole arcuate artery branch. MEDICATIONS: None. ANESTHESIA/SEDATION: Moderate (conscious) sedation was employed during this procedure. A total of Versed 3 mg and Fentanyl 50 mcg was administered intravenously. Moderate Sedation Time: 66 minutes. The patient's level of consciousness and vital signs were monitored continuously by radiology nursing throughout the procedure under my direct supervision. CONTRAST:  47mL OMNIPAQUE IOHEXOL 350 MG/ML SOLN, 40mL OMNIPAQUE IOHEXOL 350 MG/ML SOLN FLUOROSCOPY TIME:  Fluoroscopy Time: 10.6 minutes, (811 mGy). COMPLICATIONS: None immediate. PROCEDURE: Informed consent was obtained from the patient following explanation of the procedure, risks, benefits and alternatives. The patient understands, agrees and consents for the procedure. All questions were addressed. A time out was performed prior to the initiation of the procedure. Maximal barrier sterile technique utilized including caps, mask, sterile gowns, sterile gloves, large sterile drape, hand hygiene, and chlorhexidine prep. Preprocedure ultrasound evaluation of the right internal jugular vein demonstrated a patent and compressible vein free of internal echoes. Procedure was planned. Subdermal Local anesthesia was provided 1% lidocaine. A small skin nick was made. Under direct ultrasound visualization, a 21 gauge micropuncture needle was directed into the internal jugular vein. An image was captured and stored in the permanent record. A micropuncture set was inserted and exchanged for a J wire which was positioned in the inferior vena cava. Serial dilation was performed followed by placement of a 12.5 French, 24 cm Trialysis catheter. The catheter tip was positioned in the right atrium. Each lumen flushed and aspirated  appropriately. The dialysis ports were locked with appropriate volume of heparin dwell. The middle central venous port was then used for sedation for the remainder of the procedure. The catheter was secured with a 0 silk retention suture. A sterile bandage was applied. Preprocedure ultrasound evaluation of the right groin was performed which demonstrated a patent right common femoral artery. The procedure was planned. Subdermal Local anesthesia was provided with 1% lidocaine. A small skin nick was made. Under direct ultrasound visualization, a 21 gauge micropuncture needle was directed into the common femoral artery. An ultrasound image was captured and stored in the permanent record. A micro puncture set was inserted and a limited right lower extremity angiogram was performed which demonstrated appropriate puncture site for closure device use. A J wire was directed to the abdominal aorta and the micropuncture set was exchanged for a 5 [place] vascular sheath. A 5 French C2 catheter was then directed into the right renal ostium. Right renal angiogram was performed.  The single main renal artery was patent. 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 A straight lantern microcatheter and 0.014" soft synchro wire was then inserted and directed into the inferior polar branch. Repeat angiogram was performed in the sub selective location which was significant for multifocal 2-4 mm pseudoaneurysm formation, abnormal truncation and irregularity of the arcuate and interlobular branches, an early arteriovenous shunting. The inferior polar arcuate branch was selected further. Coil embolization was performed with multiple low profile Penumbra Ruby coils ranging from 2-3 mm in diameter. Completion right inferior renal  angiogram was performed which demonstrated appropriate embolization of the targeted vessels without persistent arteriovenous fistula or vessel irregularity. The catheters were removed. The right common femoral artery was then closed with a 6 [place] Angio-Seal device. Distal pulses were unchanged. The patient tolerated the procedure well was transferred back to the floor in good condition. IMPRESSION: 1. 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 2. Sub selective coil embolization of right inferior polar arcuate artery branch. 3. Successful placement of right internal jugular, 24 French Trialysis catheter with the catheter tip in the right atrium. The catheter is ready for immediate use. Ruthann Cancer, MD [facility] Radiology Electronically Signed   By: Ruthann Cancer M.D.   On: 08/31/2021 09:29   IR US Guide Vasc Access Right  Result Date: 08/31/2021 INDICATION: 70 year old male with history of acute kidney injury of uncertain etiology status post ultrasound-guided right renal biopsy on 08/28/2021. Since biopsy, the patient has experienced gross hematuria with associated acute anemia. EXAM: 1. Ultrasound-guided vascular access of the right internal jugular vein. 2. Temporary hemodialysis catheter placement. 3. Ultrasound-guided vascular access of the right common femoral artery. 4. Selective catheterization and angiography of the right renal artery. 5. Sub selective catheterization angiography of right inferior polar and arcuate artery branches. 6. Coil embolization of right inferior pole arcuate artery branch. MEDICATIONS: None. ANESTHESIA/SEDATION: Moderate (conscious) sedation was employed during this procedure. A total of Versed 3 mg and Fentanyl 50 mcg was administered intravenously. Moderate Sedation Time: 66  minutes. The patient's level of consciousness and vital signs were monitored continuously by radiology nursing throughout the procedure under my direct supervision. CONTRAST:  39mL OMNIPAQUE IOHEXOL 350 MG/ML SOLN, 70mL OMNIPAQUE IOHEXOL 350 MG/ML SOLN FLUOROSCOPY TIME:  Fluoroscopy Time: 10.6 minutes, (811 mGy). COMPLICATIONS: None immediate. PROCEDURE: Informed consent was obtained from the patient following explanation of the procedure, risks, benefits and alternatives. The patient understands, agrees and consents for the procedure. All questions were addressed. A time out was performed prior to the initiation of the procedure. Maximal barrier sterile technique utilized including caps, mask, sterile gowns, sterile gloves, large sterile drape, hand hygiene, and chlorhexidine prep. Preprocedure ultrasound evaluation of the right internal jugular vein demonstrated a patent and compressible vein free of internal echoes. Procedure was planned. Subdermal Local anesthesia was provided 1% lidocaine. A small skin nick was made. Under direct ultrasound visualization, a 21 gauge micropuncture needle was directed into the internal jugular vein. An image was captured and stored in the permanent record. A micropuncture set was inserted and exchanged for a J wire which was positioned in the inferior vena cava. Serial dilation was performed followed by placement of  a 12.5 [place], 24 cm Trialysis catheter. The catheter tip was positioned in the right atrium. Each lumen flushed and aspirated appropriately. The dialysis ports were locked with appropriate volume of heparin dwell. The middle central venous port was then used for sedation for the remainder of the procedure. The catheter was secured with a 0 silk retention suture. A sterile bandage was applied. Preprocedure ultrasound evaluation of the right groin was performed which demonstrated a patent right common femoral artery. The procedure was planned. Subdermal Local  anesthesia was provided with 1% lidocaine. A small skin nick was made. Under direct ultrasound visualization, a 21 gauge micropuncture needle was directed into the common femoral artery. An ultrasound image was captured and stored in the permanent record. A micro puncture set was inserted and a limited right lower extremity angiogram was performed which demonstrated appropriate puncture site for closure device use. A J wire was directed to the abdominal aorta and the micropuncture set was exchanged for a 5 [place] vascular sheath. A 5 French C2 catheter was then directed into the right renal ostium. Right renal angiogram was performed. The single main renal artery was patent. 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 A straight lantern microcatheter and 0.014" soft synchro wire was then inserted and directed into the inferior polar branch. Repeat angiogram was performed in the sub selective location which was significant for multifocal 2-4 mm pseudoaneurysm formation, abnormal truncation and irregularity of the arcuate and interlobular branches, an early arteriovenous shunting. The inferior polar arcuate branch was selected further. Coil embolization was performed with multiple low profile Penumbra Ruby coils ranging from 2-3 mm in diameter. Completion right inferior renal angiogram was performed which demonstrated appropriate embolization of the targeted vessels without persistent arteriovenous fistula or vessel irregularity. The catheters were removed. The right common femoral artery was then closed with a 6 [place] Angio-Seal device. Distal pulses were unchanged. The patient tolerated the procedure well was transferred back to the floor in good condition. IMPRESSION: 1. 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 2. Sub selective coil embolization of right inferior polar arcuate artery branch. 3. Successful placement of right internal jugular, 24 French Trialysis catheter with the catheter tip in the right atrium. The catheter is ready for immediate use. Ruthann Cancer, MD [facility] Radiology Electronically Signed   By: Ruthann Cancer M.D.   On: 08/31/2021 09:29   IR US Guide Vasc Access Right  Result Date: 08/31/2021 INDICATION: 70 year old male with history of acute kidney injury of uncertain etiology status post ultrasound-guided right renal biopsy on 08/28/2021. Since biopsy, the patient has experienced gross hematuria with associated acute anemia. EXAM: 1. Ultrasound-guided vascular access of the right internal jugular vein. 2. Temporary hemodialysis catheter placement. 3. Ultrasound-guided vascular access of the right common femoral artery. 4. Selective catheterization and angiography of the right renal artery. 5. Sub selective catheterization angiography of right inferior polar and arcuate artery branches. 6. Coil embolization of right inferior pole arcuate artery branch. MEDICATIONS: None. ANESTHESIA/SEDATION: Moderate (conscious) sedation was employed during this procedure. A total of Versed 3 mg and Fentanyl 50 mcg was administered intravenously. Moderate Sedation Time: 66 minutes. The patient's level of consciousness and vital signs  were monitored continuously by radiology nursing throughout the procedure under my direct supervision. CONTRAST:  40mL OMNIPAQUE IOHEXOL 350 MG/ML SOLN, 9mL OMNIPAQUE IOHEXOL 350 MG/ML SOLN FLUOROSCOPY TIME:  Fluoroscopy Time: 10.6 minutes, (811 mGy). COMPLICATIONS: None immediate. PROCEDURE: Informed consent was obtained from the patient following explanation of the procedure,  risks, benefits and alternatives. The patient understands, agrees and consents for the procedure. All questions were addressed. A time out was performed prior to the initiation of the procedure. Maximal barrier sterile technique utilized including caps, mask, sterile gowns, sterile gloves, large sterile drape, hand hygiene, and chlorhexidine prep. Preprocedure ultrasound evaluation of the right internal jugular vein demonstrated a patent and compressible vein free of internal echoes. Procedure was planned. Subdermal Local anesthesia was provided 1% lidocaine. A small skin nick was made. Under direct ultrasound visualization, a 21 gauge micropuncture needle was directed into the internal jugular vein. An image was captured and stored in the permanent record. A micropuncture set was inserted and exchanged for a J wire which was positioned in the inferior vena cava. Serial dilation was performed followed by placement of a 12.5 French, 24 cm Trialysis catheter. The catheter tip was positioned in the right atrium. Each lumen flushed and aspirated appropriately. The dialysis ports were locked with appropriate volume of heparin dwell. The middle central venous port was then used for sedation for the remainder of the procedure. The catheter was secured with a 0 silk retention suture. A sterile bandage was applied. Preprocedure ultrasound evaluation of the right groin was performed which demonstrated a patent right common femoral artery. The procedure was planned. Subdermal Local anesthesia was provided with 1% lidocaine. A small skin nick was made. Under direct ultrasound visualization, a 21 gauge micropuncture needle was directed into the common femoral artery. An ultrasound image was captured and stored in the permanent record. A micro puncture set was inserted and a limited right lower extremity angiogram was performed which demonstrated appropriate puncture site for closure device use. A J wire was directed to the  abdominal aorta and the micropuncture set was exchanged for a 5 [place] vascular sheath. A 5 French C2 catheter was then directed into the right renal ostium. Right renal angiogram was performed. The single main renal artery was patent. 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 A straight lantern microcatheter and 0.014" soft synchro wire was then inserted and directed into the inferior polar branch. Repeat angiogram was performed in the sub selective location which was significant for multifocal 2-4 mm pseudoaneurysm formation, abnormal truncation and irregularity of the arcuate and interlobular branches, an early arteriovenous shunting. The inferior polar arcuate branch was selected further. Coil embolization was performed with multiple low profile Penumbra Ruby coils ranging from 2-3 mm in diameter. Completion right inferior renal angiogram was performed which demonstrated appropriate embolization of the targeted vessels without persistent arteriovenous fistula or vessel irregularity. The catheters were removed. The right common femoral artery was then closed with a 6 [place] Angio-Seal device. Distal pulses were unchanged. The patient tolerated the procedure well was transferred back to the floor in good condition. IMPRESSION: 1. 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 2. Sub selective coil embolization of right inferior polar arcuate artery branch. 3.  Successful placement of right internal jugular, 24 French Trialysis catheter with the catheter tip in the right atrium. The catheter is ready for immediate use. Ruthann Cancer, MD Vascular [facility] Inter[facility]  [facility] Radiology Electronically Signed   By: Ruthann Cancer M.D.   On: 08/31/2021 09:29   IR EMBO ART  VEN HEMORR LYMPH EXTRAV  INC GUIDE ROADMAPPING  Result Date: 08/31/2021 INDICATION: 70 year old male with history of acute kidney injury of uncertain etiology status post ultrasound-guided right renal biopsy on 08/28/2021. Since biopsy, the patient has experienced gross hematuria with associated acute anemia. EXAM: 1. Ultrasound-guided vascular access of the right internal jugular vein. 2. Temporary hemodialysis catheter placement. 3. Ultrasound-guided vascular access of the right common femoral artery. 4. Selective catheterization and angiography of the right renal artery. 5. Sub selective catheterization angiography of right inferior polar and arcuate artery branches. 6. Coil embolization of right inferior pole arcuate artery branch. MEDICATIONS: None. ANESTHESIA/SEDATION: Moderate (conscious) sedation was employed during this procedure. A total of Versed 3 mg and Fentanyl 50 mcg was administered intravenously. Moderate Sedation Time: 66 minutes. The patient's level of consciousness and vital signs were monitored continuously by radiology nursing throughout the procedure under my direct supervision. CONTRAST:  21mL OMNIPAQUE IOHEXOL 350 MG/ML SOLN, 56mL OMNIPAQUE IOHEXOL 350 MG/ML SOLN FLUOROSCOPY TIME:  Fluoroscopy Time: 10.6 minutes, (811 mGy). COMPLICATIONS: None immediate. PROCEDURE: Informed consent was obtained from the patient following explanation of the procedure, risks, benefits and alternatives. The patient understands, agrees and consents for the procedure. All questions were addressed. A time out was performed prior to the initiation of the procedure. Maximal barrier sterile technique utilized including caps, mask, sterile gowns, sterile gloves, large sterile drape, hand hygiene, and chlorhexidine prep. Preprocedure ultrasound evaluation of the right internal jugular vein  demonstrated a patent and compressible vein free of internal echoes. Procedure was planned. Subdermal Local anesthesia was provided 1% lidocaine. A small skin nick was made. Under direct ultrasound visualization, a 21 gauge micropuncture needle was directed into the internal jugular vein. An image was captured and stored in the permanent record. A micropuncture set was inserted and exchanged for a J wire which was positioned in the inferior vena cava. Serial dilation was performed followed by placement of a 12.5 French, 24 cm Trialysis catheter. The catheter tip was positioned in the right atrium. Each lumen flushed and aspirated appropriately. The dialysis ports were locked with appropriate volume of heparin dwell. The middle central venous port was then used for sedation for the remainder of the procedure. The catheter was secured with a 0 silk retention suture. A sterile bandage was applied. Preprocedure ultrasound evaluation of the right groin was performed which demonstrated a patent right common femoral artery. The procedure was planned. Subdermal Local anesthesia was provided with 1% lidocaine. A small skin nick was made. Under direct ultrasound visualization, a 21 gauge micropuncture needle was directed into the common femoral artery. An ultrasound image was captured and stored in the permanent record. A micro puncture set was inserted and a limited right lower extremity angiogram was performed which demonstrated appropriate puncture site for closure device use. A J wire was directed to the abdominal aorta and the micropuncture set was exchanged for a 5 [place] vascular sheath. A 5 French C2 catheter was then directed into the right renal ostium. Right renal angiogram was performed. The single main renal artery was patent. About 2 arcuate artery branches in the inferior pole there is abnormal truncation and vessel irregularity with evidence  of an early filling arteriovenous fistula in addition to suggestion  of faint filling into the collecting system on delayed imaging. A straight lantern microcatheter and 0.014" soft synchro wire was then inserted and directed into the inferior polar branch. Repeat angiogram was performed in the sub selective location which was significant for multifocal 2-4 mm pseudoaneurysm formation, abnormal truncation and irregularity of the arcuate and interlobular branches, an early arteriovenous shunting. The inferior polar arcuate branch was selected further. Coil embolization was performed with multiple low profile Penumbra Ruby coils ranging from 2-3 mm in diameter. Completion right inferior renal angiogram was performed which demonstrated appropriate embolization of the targeted vessels without persistent arteriovenous fistula or vessel irregularity. The catheters were removed. The right common femoral artery was then closed with a 6 [place] Angio-Seal device. Distal pulses were unchanged. The patient tolerated the procedure well was transferred back to the floor in good condition. IMPRESSION: 1. 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 2. Sub selective coil embolization of right inferior polar arcuate artery branch. 3. Successful placement of right internal jugular, 24 French Trialysis catheter with the catheter tip in the right atrium. The catheter is ready for immediate use. Ruthann Cancer, MD Vascular and Interventional Radiology Specialists [facility] Radiology Electronically Signed   By: Ruthann Cancer M.D.   On: 08/31/2021 09:29    Medications: Infusions:  sodium chloride     sodium chloride 10 mL/hr at 08/27/21 2147   lactated ringers 75 mL/hr at 09/01/21 0816   sodium chloride irrigation      Scheduled Medications:  Chlorhexidine Gluconate Cloth  6 each Topical Daily   cyanocobalamin  1,000 mcg Subcutaneous [id number]   folic acid   2 mg Oral Daily   melatonin  5 mg Oral QHS   nicotine  14 mg Transdermal Q24H   pantoprazole  40 mg Oral BID AC   sodium bicarbonate  1,300 mg Oral TID   tamsulosin  0.4 mg Oral Daily    have reviewed scheduled and prn medications.  Physical Exam: General: Lying on bed comfortable, not in distress Heart: Regular rate rhythm, S1-S2 normal, no rubs Lungs: Clear bilateral, no wheezing or crackle Abdomen: Soft, nontender, nondistended Extremities: No LE edema. Neurology: Alert, awake, following commands Vascular Access: Right IJ temporary HD catheter in place.  Deadrick Stidd Prasad Dannie Woolen 09/01/2021,9:58 AM  LOS: 9 days

## 2021-09-01 NOTE — Evaluation (Signed)
Occupational Therapy Evaluation Patient Details Name: Caleb Simmons MRN: [id number] DOB: 03-13-1951 Today's Date: 09/01/2021    History of Present Illness Patient is a 70 y.o. male presenting with generalized weakness/shortness of breath-found to have AKI and severe normocytic anemia. Underwent right renal biopsy on 9/6-unfortunately postrenal biopsy-patient developed bleeding into the renal collecting system-with clot retention requiring insertion of three-way catheter and CBI.  Patient continued to have renal bleeding-on 9/8-IR performed embolization. Past medical history significant of nephrolithiasis, 1/2 PPD smoker   Clinical Impression   Vaughan was evaluated s/p the above admission list. PTA he was very indep, driving and working. He lives alone in a 1 level home with 3 STE. Upon evaluation, pt was mod I for bed mobility and min guard for all functional mobility without AD. He was limited by fatigue and generalized weakness. Currently he is requiring up to min guard/set up for all Adls. Pt would benefit from continued OT acutely to progress towards his very indep baseline. Recommend Home with [facility] as like will likely progress well acutely.     Follow Up Recommendations  Home health OT;Supervision - Intermittent    Equipment Recommendations  3 in 1 bedside commode (pt may benefit from AD prior to d/c home depending on how he progresses acutely)    Recommendations for Other Services       Precautions / Restrictions Precautions Precautions: Fall Restrictions Weight Bearing Restrictions: No      Mobility Bed Mobility Overal bed mobility: Modified Independent             General bed mobility comments: HOB elevated    Transfers Overall transfer level: Needs assistance Equipment used: None Transfers: Sit to/from Stand Sit to Stand: Supervision         General transfer comment: supervision for safety only, pt mildly slow to rise    Balance Overall balance assessment:  Needs assistance Sitting-balance support: Feet supported Sitting balance-Leahy Scale: Good     Standing balance support: No upper extremity supported Standing balance-Leahy Scale: Fair Standing balance comment: pt attempting to hold onto external support with 1UE in standing; ambulated without BUE support                           ADL either performed or assessed with clinical judgement   ADL Overall ADL's : Needs assistance/impaired Eating/Feeding: Independent;Sitting   Grooming: Min guard;Standing   Upper Body Bathing: Supervision/ safety;Sitting   Lower Body Bathing: Min guard;Sit to/from stand   Upper Body Dressing : Supervision/safety;Set up;Sitting   Lower Body Dressing: Min guard;Sit to/from stand   Toilet Transfer: Min guard;Ambulation;Comfort height toilet;Grab bars   Toileting- Clothing Manipulation and Hygiene: Supervision/safety;Sitting/lateral lean       Functional mobility during ADLs: Min guard;Caregiver able to provide necessary level of assistance General ADL Comments: min guard throughout for safety     Vision Baseline Vision/History: 1 Wears glasses Vision Assessment?: No apparent visual deficits     Perception     Praxis      Pertinent Vitals/Pain Pain Assessment: Faces Faces Pain Scale: Hurts little more Pain Location: R shoulder Pain Descriptors / Indicators: Discomfort     Hand Dominance Right   Extremity/Trunk Assessment Upper Extremity Assessment Upper Extremity Assessment: RUE deficits/detail RUE Deficits / Details: limited shoulder ROM due to pain RUE: Unable to fully assess due to pain RUE Sensation: WNL RUE Coordination: decreased fine motor   Lower Extremity Assessment Lower Extremity Assessment: Defer to PT  evaluation   Cervical / Trunk Assessment Cervical / Trunk Assessment: Normal   Communication Communication Communication: No difficulties   Cognition Arousal/Alertness: Awake/alert Behavior During  Therapy: WFL for tasks assessed/performed Overall Cognitive Status: Within Functional Limits for tasks assessed                                 General Comments: Pt reported that his wife, brother and mom all passed last year.   General Comments  Pt's monitor was reading vtach whiel walking however likely due to poor lead polacement. Finger pulse ox monitor reading 100s; pt did not have any symptoms, did not feel as if his heart was racing    Exercises     Shoulder Instructions      Home Living Family/patient expects to be discharged to:: Private residence Living Arrangements: Alone Available Help at Discharge: Family;Available PRN/intermittently;Friend(s) (daughter in law in [place], friends he says he can call) Type of Home: House Home Access: Stairs to enter [employer] of Steps: 2-3 Entrance Stairs-Rails: None Home Layout: One level     Bathroom Shower/Tub: Teacher, early years/pre: Standard     Home Equipment: Cane - single point;Walker - 4 wheels          Prior Functioning/Environment Level of Independence: Independent        Comments: driving, working full time as a Interior and spatial designer Problem List: Decreased strength;Decreased range of motion;Decreased activity tolerance;Impaired balance (sitting and/or standing);Decreased safety awareness;Decreased knowledge of use of DME or AE;Pain      OT Treatment/Interventions: Self-care/ADL training;Therapeutic activities;Patient/family education;Balance training    OT Goals(Current goals can be found in the care plan section) Acute Rehab OT Goals Patient Stated Goal: feel better OT Goal Formulation: With patient Potential to Achieve Goals: Fair  OT Frequency: Min 2X/week   Barriers to D/C: Decreased caregiver support  pt lives alone, relatives in [place]       Co-evaluation PT/OT/SLP Co-Evaluation/Treatment: Yes Reason for Co-Treatment: Complexity of the patient's  impairments (multi-system involvement);For patient/therapist safety   OT goals addressed during session: ADL's and self-care      AM-PAC OT "6 Clicks" Daily Activity     Outcome Measure Help from another person eating meals?: None Help from another person taking care of personal grooming?: A Little Help from another person toileting, which includes using toliet, bedpan, or urinal?: A Little Help from another person bathing (including washing, rinsing, drying)?: A Little Help from another person to put on and taking off regular upper body clothing?: None Help from another person to put on and taking off regular lower body clothing?: A Little 6 Click Score: 20   End of Session Nurse Communication: Mobility status;Weight bearing status;Precautions  Activity Tolerance: Patient tolerated treatment well Patient left: in chair;with call bell/phone within reach (Lab present)  OT Visit Diagnosis: Other abnormalities of gait and mobility (R26.89);Pain;Muscle weakness (generalized) (M62.81)                Time: 3570-1779 OT Time Calculation (min): 18 min Charges:  OT General Charges $OT Visit: 1 Visit OT Evaluation $OT Eval Moderate Complexity: 1 Mod    Aul Mangieri A Meosha Castanon 09/01/2021, 4:35 PM

## 2021-09-01 NOTE — Progress Notes (Signed)
Critical lab value, Hemoglobin 6.6 received. Notified Dr. Waldron [facility] and orders received. First unit P-RBC's started at 1750, no reaction noted after 15 minutes, VSS.  Tolerating well.

## 2021-09-01 NOTE — Evaluation (Signed)
Physical Therapy Evaluation Patient Details Name: Caleb Simmons MRN: [id number] DOB: 07/16/1951 Today's Date: 09/01/2021   History of Present Illness  Patient is a 70 y.o. male presenting with generalized weakness/shortness of breath-found to have AKI and severe normocytic anemia. Underwent right renal biopsy on 9/6-unfortunately postrenal biopsy-patient developed bleeding into the renal collecting system-with clot retention requiring insertion of three-way catheter and CBI.  Patient continued to have renal bleeding-on 9/8-IR performed embolization. Past medical history significant of nephrolithiasis, 1/2 PPD smoker   Clinical Impression  Pt in bed upon arrival of PT, agreeable to evaluation at this time. Prior to admission the pt was completely independent with all mobility, working full time in maintenance. The pt now presents with minor limitations in functional mobility, activity tolerance, and dynamic stability due to above dx, and will continue to benefit from skilled PT to address these deficits. The pt was able to complete bed mobility and sitting balance without issue, but benefits from minG for safety with standing or ambulation at this time. He had no LOB with gait, but intermittently seeks single UE support due to feeling "wobbly" and will benefit from skilled PT to progress activity tolerance and stability to allow for return to full independence.      Follow Up Recommendations No PT follow up;Supervision - Intermittent    Equipment Recommendations  None recommended by PT    Recommendations for Other Services       Precautions / Restrictions Precautions Precautions: Fall Precaution Comments: pt reports x1 episode of passing out with OOB moblity this admission Restrictions Weight Bearing Restrictions: No      Mobility  Bed Mobility Overal bed mobility: Modified Independent             General bed mobility comments: HOB elevated    Transfers Overall transfer level:  Needs assistance Equipment used: None Transfers: Sit to/from Stand Sit to Stand: Supervision         General transfer comment: supervision for safety only, pt mildly slow to rise  Ambulation/Gait Ambulation/Gait assistance: Min guard Gait Distance (Feet): 75 Feet Assistive device: None;1 person hand held assist Gait Pattern/deviations: Step-through pattern;Decreased stride length Gait velocity: decreased   General Gait Details: pt with slight trunk flexion and slowed gait, no overt LOB     Balance Overall balance assessment: Needs assistance Sitting-balance support: Feet supported Sitting balance-Leahy Scale: Good     Standing balance support: No upper extremity supported Standing balance-Leahy Scale: Fair Standing balance comment: pt attempting to hold onto external support with 1UE in standing; ambulated without BUE support                             Pertinent Vitals/Pain Pain Assessment: Faces Faces Pain Scale: Hurts little more Pain Location: R shoulder Pain Descriptors / Indicators: Discomfort Pain Intervention(s): Monitored during session;Repositioned    Home Living Family/patient expects to be discharged to:: Private residence Living Arrangements: Alone Available Help at Discharge: Family;Available PRN/intermittently;Friend(s) (daughter in law in [place], friends he says he can call) Type of Home: House Home Access: Stairs to enter Entrance Stairs-Rails: None Entrance Stairs-Number of Steps: 2-3 Home Layout: One level Home Equipment: Cane - single point;Walker - 4 wheels      Prior Function Level of Independence: Independent         Comments: driving, working full time as a Presenter, broadcasting   Dominant Hand: Right    Extremity/Trunk Assessment  Upper Extremity Assessment Upper Extremity Assessment: Defer to OT evaluation RUE Deficits / Details: limited shoulder ROM due to pain RUE: Unable to fully assess  due to pain RUE Sensation: WNL RUE Coordination: decreased fine motor    Lower Extremity Assessment Lower Extremity Assessment: Overall WFL for tasks assessed    Cervical / Trunk Assessment Cervical / Trunk Assessment: Normal  Communication   Communication: No difficulties  Cognition Arousal/Alertness: Awake/alert Behavior During Therapy: WFL for tasks assessed/performed Overall Cognitive Status: Within Functional Limits for tasks assessed                                 General Comments: Pt reported that his wife, brother and mom all passed last year.      General Comments General comments (skin integrity, edema, etc.): pt monitor reading vtach during ambulation, poor reading quality, pluse ox reading HR in 100s. pt remained asymptomatic     PT Assessment Patient needs continued PT services  PT Problem List Decreased activity tolerance;Decreased balance;Decreased mobility;Decreased coordination       PT Treatment Interventions DME instruction;Gait training;Stair training;Functional mobility training;Therapeutic activities;Therapeutic exercise;Balance training;Patient/family education    PT Goals (Current goals can be found in the Care Plan section)  Acute Rehab PT Goals Patient Stated Goal: return home and feel better PT Goal Formulation: With patient Time For Goal Achievement: 09/15/21 Potential to Achieve Goals: Good    Frequency Min 3X/week   Barriers to discharge        Co-evaluation PT/OT/SLP Co-Evaluation/Treatment: Yes Reason for Co-Treatment: Complexity of the patient's impairments (multi-system involvement);For patient/therapist safety;To address functional/ADL transfers PT goals addressed during session: Mobility/safety with mobility;Balance;Strengthening/ROM OT goals addressed during session: ADL's and self-care       AM-PAC PT "6 Clicks" Mobility  Outcome Measure Help needed turning from your back to your side while in a flat bed  without using bedrails?: None Help needed moving from lying on your back to sitting on the side of a flat bed without using bedrails?: None Help needed moving to and from a bed to a chair (including a wheelchair)?: A Little Help needed standing up from a chair using your arms (e.g., wheelchair or bedside chair)?: A Little Help needed to walk in hospital room?: A Little Help needed climbing 3-5 steps with a railing? : A Little 6 Click Score: 20    End of Session Equipment Utilized During Treatment: Gait belt Activity Tolerance: Patient tolerated treatment well Patient left: in chair (lab present for draw) Nurse Communication: Mobility status PT Visit Diagnosis: Other abnormalities of gait and mobility (R26.89)    Time: 6222-9798 PT Time Calculation (min) (ACUTE ONLY): 19 min   Charges:   PT Evaluation $PT Eval Low Complexity: 1 Low          West Carbo, PT, DPT   Acute Rehabilitation Department Pager #: [phone]  Sandra Cockayne 09/01/2021, 5:18 PM

## 2021-09-01 NOTE — Progress Notes (Signed)
Referring Physician(s): Dr Carolin Sicks  Supervising Physician: Jacqulynn Cadet  Patient Status:  [facility] - In-pt  Chief Complaint:  Random renal biopsy 9/6-- post bx bleed 9/8 IR procedure: IMPRESSION: 1. 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 2. Sub selective coil embolization of right inferior polar arcuate artery branch   Subjective:  Pt is up in bed Urine in urinal is dark red Hgb 7.6 today (7.1) Alert/pleasant   Allergies: Patient has no known allergies.  Medications: Prior to Admission medications   Medication Sig Start Date End Date Taking? Authorizing Provider  amoxicillin-clavulanate (AUGMENTIN) 875-125 MG tablet Take 1 tablet by mouth 2 (two) times daily. 08/14/21  Yes [provider]  Aspirin-Salicylamide-Caffeine (BC FAST PAIN RELIEF) 650-195-33.3 MG PACK Take 1 Package by mouth daily as needed (pain).   Yes [provider]  promethazine-dextromethorphan (PROMETHAZINE-DM) 6.25-15 MG/5ML syrup Take 5 mLs by mouth every 4 (four) hours as needed for cough. 08/14/21  Yes [provider]     Vital Signs: BP 134/74 (BP Location: Left Arm)   Pulse 78   Temp 98.6 F (37 C) (Oral)   Resp 16   Ht 5\' 10"  (1.778 m)   Wt 180 lb (81.6 kg)   SpO2 92%   BMI 25.83 kg/m   Physical Exam Constitutional:      Appearance: Normal appearance.  Genitourinary:    Comments: Urine is dark red in urinal Skin:    General: Skin is warm.     Comments: Rt groin NT no bleeding No hematoma    Imaging: CT ABDOMEN PELVIS WO CONTRAST  Result Date: 08/30/2021 CLINICAL DATA:  Retroperitoneal hematoma, follow up s/p random renal bx with perinephric hematoma development EXAM: CT ABDOMEN AND PELVIS WITHOUT CONTRAST TECHNIQUE: Multidetector CT imaging of the abdomen and pelvis was performed following the  standard protocol without IV contrast. COMPARISON:  September 6 FINDINGS: Inferior chest: Trace bilateral pleural effusions with right greater than left compressive subsegmental atelectasis. Hepatobiliary: The liver is normal in size without focal abnormality. No intrahepatic or extrahepatic biliary ductal dilation. The gallbladder appears normal. Spleen: Normal in size without focal abnormality. Pancreas: No pancreatic ductal dilatation or surrounding inflammatory changes. Adrenals/Urinary Tract: Adrenal glands are unremarkable. The kidneys are normal in size. Tiny perinephric hematoma along the right renal lower pole is unchanged. Hyperdense material in the right collecting system and bladder consistent with blood products, grossly similar. Stomach/Bowel: The stomach, small bowel and large bowel are normal in caliber without abnormal wall thickening or surrounding inflammatory changes. Reproductive: Prostate is unremarkable. Lymphatic: No enlarged lymph nodes in the abdomen or pelvis. Vasculature: The abdominal aorta is normal in caliber. Aortic atherosclerosis. Other: No abdominopelvic ascites. Musculoskeletal: No aggressive osseous lesions. Degenerative changes at L5-S1. Bone island in the left ilium. IMPRESSION: The small perinephric hematoma along the right renal lower pole is stable, and within expected limits after percutaneous biopsy. However, there remains substantial clot burden within the bladder. Follow-up urology recommendations for management. Electronically Signed   By: Albin Felling M.D.   On: 08/30/2021 14:21   CT ABDOMEN PELVIS WO CONTRAST  Result Date: 08/29/2021 CLINICAL DATA:  Post right-sided renal biopsy, now with hematuria and hypotension. EXAM: CT ABDOMEN AND PELVIS WITHOUT CONTRAST TECHNIQUE: Multidetector CT imaging of the abdomen and pelvis was performed following the standard protocol without IV contrast. COMPARISON:  CT abdomen pelvis-08/23/2021; ultrasound-guided right renal  biopsy-earlier same  day FINDINGS: The lack of intravenous contrast limits the ability to evaluate solid abdominal organs. Lower chest: Limited visualization of the lower thorax demonstrates interval development of trace bilateral effusion with worsening bibasilar heterogeneous/consolidative opacities, right greater than left. Previously identified nonspecific ground-glass opacities within the imaged lung bases is not seen on the present examination though there is mild residual intraseptal thickening. Normal heart size. Trace amount of pericardial fluid, unchanged presumably physiologic. There is diffuse decreased attenuation intra cardiac blood pool suggestive of anemia. Hepatobiliary: Normal hepatic contour. Apparent high density material within the gallbladder could represent biliary sludge. No definitive gallbladder wall thickening or pericholecystic stranding on this noncontrast examination. No ascites. Pancreas: Normal noncontrast appearance of the pancreas. Spleen: Normal noncontrast appearance of the spleen. Adrenals/Urinary Tract: There is a very tiny (approximately 2.7 x 1.6 x 1.2 cm) perinephric hematoma about the inferior pole of the right kidney (axial image 44, series 3; coronal image 57, series 6), with minimal amount of adjacent perinephric stranding. High-density material is seen within the right renal collecting system and ureter with moderate to large amount of layering high-density material within urinary bladder, findings compatible with hemorrhage into the collecting system and bladder. Mild associated right-sided pelviectasis and ureterectasis. Normal noncontrast appearance of the left kidney. No evidence of left-sided nephrolithiasis or urinary obstruction. Normal noncontrast appearance of the bilateral adrenal glands. Stomach/Bowel: Scattered minimal colonic diverticulosis without evidence of superimposed acute diverticulitis on this noncontrast examination. Normal appearance of the terminal  ileum. The appendix is not visualized compatible with provided operative history. No discrete areas of bowel wall thickening on this noncontrast examination. No pneumoperitoneum, pneumatosis or portal venous gas. Vascular/Lymphatic: Moderate amount of atherosclerotic plaque within normal caliber abdominal aorta. Scattered retroperitoneal lymph nodes are numerous though individually not enlarged by size criteria with index left sided periaortic lymph node measuring 0.7 cm in greatest short axis diameter (image 31, series 3), presumably reactive in etiology. No bulky retroperitoneal, mesenteric, pelvic or inguinal lymphadenopathy on this noncontrast examination Reproductive: Dystrophic calcifications within normal sized prostate gland. Trace amount of fluid within the pelvic cul-de-sac. Other: Small bilateral mesenteric fat containing inguinal hernias, left greater than right. Minimal amount of subcutaneous edema about the midline of the low back. Presumed shrapnel is seen within the right lower abdominal/pelvic ventral abdominal wall. Musculoskeletal: No acute or aggressive osseous abnormalities. Mild-to-moderate multilevel lumbar spine DDD, worse at L4-L5 and L5-S1 with disc space height loss, endplate irregularity and small posteriorly directed disc osteophyte complexes at these locations. Mild degenerative change the bilateral hips with joint space loss, subchondral sclerosis and osteophytosis, right greater than left. IMPRESSION: 1. Post right-sided renal biopsy complicated by tiny (approximately 2.7 cm) perinephric hematoma and bleeding into the right renal collecting system including moderate to large-sized clot within the urinary bladder and associated mild right-sided pelviectasis and ureterectasis. Consideration for initiation of continuous bladder irrigation could be performed as indicated. 2. Trace bilateral effusions with associated bibasilar opacities, right greater than left, likely atelectasis. 3.  Colonic diverticulosis without evidence superimposed acute diverticulitis. 4.  Aortic Atherosclerosis (ICD10-I70.0). Critical Value/emergent results were called by telephone at the time of interpretation on 08/28/2021 at 5:04 pm to provider [facility] , who verbally acknowledged these results. Electronically Signed   By: Sandi Mariscal M.D.   On: 08/29/2021 10:38   IR Angiogram Renal Left Selective  INDICATION: 70 year old male with history of acute kidney injury of uncertain etiology status post ultrasound-guided right renal biopsy on 08/28/2021. Since biopsy, the patient has experienced gross  hematuria with associated acute anemia.   EXAM: 1. Ultrasound-guided vascular access of the right internal jugular vein. 2. Temporary hemodialysis catheter placement. 3. Ultrasound-guided vascular access of the right common femoral artery. 4. Selective catheterization and angiography of the right renal artery. 5. Sub selective catheterization angiography of right inferior polar and arcuate artery branches. 6. Coil embolization of right inferior pole arcuate artery branch.   MEDICATIONS: None.   ANESTHESIA/SEDATION: Moderate (conscious) sedation was employed during this procedure. A total of Versed 3 mg and Fentanyl 50 mcg was administered intravenously.   Moderate Sedation Time: 66 minutes. The patient's level of consciousness and vital signs were monitored continuously by radiology nursing throughout the procedure under my direct supervision.   CONTRAST:  9mL OMNIPAQUE IOHEXOL 350 MG/ML SOLN, 39mL OMNIPAQUE IOHEXOL 350 MG/ML SOLN   FLUOROSCOPY TIME:  Fluoroscopy Time: 10.6 minutes, (811 mGy).   COMPLICATIONS: None immediate.   PROCEDURE: Informed consent was obtained from the patient following explanation of the procedure, risks, benefits and alternatives. The patient understands, agrees and consents for the procedure. All questions were addressed. A time out was performed prior to the initiation of the procedure.  Maximal barrier sterile technique utilized including caps, mask, sterile gowns, sterile gloves, large sterile drape, hand hygiene, and chlorhexidine prep.   Preprocedure ultrasound evaluation of the right internal jugular vein demonstrated a patent and compressible vein free of internal echoes. Procedure was planned. Subdermal Local anesthesia was provided 1% lidocaine. A small skin nick was made. Under direct ultrasound visualization, a 21 gauge micropuncture needle was directed into the internal jugular vein. An image was captured and stored in the permanent record. A micropuncture set was inserted and exchanged for a J wire which was positioned in the inferior vena cava. Serial dilation was performed followed by placement of a 12.5 French, 24 cm Trialysis catheter. The catheter tip was positioned in the right atrium. Each lumen flushed and aspirated appropriately. The dialysis ports were locked with appropriate volume of heparin dwell. The middle central venous port was then used for sedation for the remainder of the procedure. The catheter was secured with a 0 silk retention suture. A sterile bandage was applied.   Preprocedure ultrasound evaluation of the right groin was performed which demonstrated a patent right common femoral artery. The procedure was planned. Subdermal Local anesthesia was provided with 1% lidocaine. A small skin nick was made. Under direct ultrasound visualization, a 21 gauge micropuncture needle was directed into the common femoral artery. An ultrasound image was captured and stored in the permanent record. A micro puncture set was inserted and a limited right lower extremity angiogram was performed which demonstrated appropriate puncture site for closure device use. A J wire was directed to the abdominal aorta and the micropuncture set was exchanged for a 5 [place] vascular sheath. A 5 French C2 catheter was then directed into the right renal ostium. Right renal angiogram was performed.  The single main renal artery was patent. 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   A straight lantern microcatheter and 0.014" soft synchro wire was then inserted and directed into the inferior polar branch. Repeat angiogram was performed in the sub selective location which was significant for multifocal 2-4 mm pseudoaneurysm formation, abnormal truncation and irregularity of the arcuate and interlobular branches, an early arteriovenous shunting. The  inferior polar arcuate branch was selected further. Coil embolization was performed with multiple low profile Penumbra Ruby coils ranging from 2-3 mm in diameter. Completion right inferior renal angiogram was performed which demonstrated appropriate embolization of the targeted vessels without persistent arteriovenous fistula or vessel irregularity.   The catheters were removed. The right common femoral artery was then closed with a 6 [place] Angio-Seal device. Distal pulses were unchanged. The patient tolerated the procedure well was transferred back to the floor in good condition.   IMPRESSION: 1. 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 2. Sub selective coil embolization of right inferior polar arcuate artery branch. 3. Successful placement of right internal jugular, 24 French Trialysis catheter with the catheter tip in the right atrium. The catheter is ready for immediate use.   Ruthann Cancer, MD   [facility]   [facility]     Electronically Signed   By: Ruthann Cancer M.D.   On: 08/31/2021 09:29    IR Fluoro Guide CV Line Right  Result Date: 08/31/2021 INDICATION:  70 year old male with history of acute kidney injury of uncertain etiology status post ultrasound-guided right renal biopsy on 08/28/2021. Since biopsy, the patient has experienced gross hematuria with associated acute anemia. EXAM: 1. Ultrasound-guided vascular access of the right internal jugular vein. 2. Temporary hemodialysis catheter placement. 3. Ultrasound-guided vascular access of the right common femoral artery. 4. Selective catheterization and angiography of the right renal artery. 5. Sub selective catheterization angiography of right inferior polar and arcuate artery branches. 6. Coil embolization of right inferior pole arcuate artery branch. MEDICATIONS: None. ANESTHESIA/SEDATION: Moderate (conscious) sedation was employed during this procedure. A total of Versed 3 mg and Fentanyl 50 mcg was administered intravenously. Moderate Sedation Time: 66 minutes. The patient's level of consciousness and vital signs were monitored continuously by radiology nursing throughout the procedure under my direct supervision. CONTRAST:  42mL OMNIPAQUE IOHEXOL 350 MG/ML SOLN, 43mL OMNIPAQUE IOHEXOL 350 MG/ML SOLN FLUOROSCOPY TIME:  Fluoroscopy Time: 10.6 minutes, (811 mGy). COMPLICATIONS: None immediate. PROCEDURE: Informed consent was obtained from the patient following explanation of the procedure, risks, benefits and alternatives. The patient understands, agrees and consents for the procedure. All questions were addressed. A time out was performed prior to the initiation of the procedure. Maximal barrier sterile technique utilized including caps, mask, sterile gowns, sterile gloves, large sterile drape, hand hygiene, and chlorhexidine prep. Preprocedure ultrasound evaluation of the right internal jugular vein demonstrated a patent and compressible vein free of internal echoes. Procedure was planned. Subdermal Local anesthesia was provided 1% lidocaine. A small skin nick was made. Under direct ultrasound visualization, a  21 gauge micropuncture needle was directed into the internal jugular vein. An image was captured and stored in the permanent record. A micropuncture set was inserted and exchanged for a J wire which was positioned in the inferior vena cava. Serial dilation was performed followed by placement of a 12.5 French, 24 cm Trialysis catheter. The catheter tip was positioned in the right atrium. Each lumen flushed and aspirated appropriately. The dialysis ports were locked with appropriate volume of heparin dwell. The middle central venous port was then used for sedation for the remainder of the procedure. The catheter was secured with a 0 silk retention suture. A sterile bandage was applied. Preprocedure ultrasound evaluation of the right groin was performed which demonstrated a patent right common femoral artery. The  procedure was planned. Subdermal Local anesthesia was provided with 1% lidocaine. A small skin nick was made. Under direct ultrasound visualization, a 21 gauge micropuncture needle was directed into the common femoral artery. An ultrasound image was captured and stored in the permanent record. A micro puncture set was inserted and a limited right lower extremity angiogram was performed which demonstrated appropriate puncture site for closure device use. A J wire was directed to the abdominal aorta and the micropuncture set was exchanged for a 5 [place] vascular sheath. A 5 French C2 catheter was then directed into the right renal ostium. Right renal angiogram was performed. The single main renal artery was patent. 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 A straight lantern microcatheter and 0.014" soft synchro wire was then inserted and directed into the inferior polar branch. Repeat angiogram was  performed in the sub selective location which was significant for multifocal 2-4 mm pseudoaneurysm formation, abnormal truncation and irregularity of the arcuate and interlobular branches, an early arteriovenous shunting. The inferior polar arcuate branch was selected further. Coil embolization was performed with multiple low profile Penumbra Ruby coils ranging from 2-3 mm in diameter. Completion right inferior renal angiogram was performed which demonstrated appropriate embolization of the targeted vessels without persistent arteriovenous fistula or vessel irregularity. The catheters were removed. The right common femoral artery was then closed with a 6 [place] Angio-Seal device. Distal pulses were unchanged. The patient tolerated the procedure well was transferred back to the floor in good condition. IMPRESSION: 1. 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 2. Sub selective coil embolization of right inferior polar arcuate artery branch. 3. Successful placement of right internal jugular, 24 French Trialysis catheter with the catheter tip in the right atrium. The catheter is ready for immediate use. Ruthann Cancer, MD [facility] Electronically Signed   By: Ruthann Cancer M.D.   On: 08/31/2021 09:29   IR US Guide Vasc Access Right  Result Date: 08/31/2021 INDICATION: 70 year old male with history of acute kidney injury of uncertain etiology status post ultrasound-guided right renal biopsy on 08/28/2021. Since biopsy, the patient has experienced gross hematuria with associated acute anemia. EXAM: 1. Ultrasound-guided vascular access of the right internal jugular vein. 2. Temporary hemodialysis catheter placement. 3. Ultrasound-guided vascular access of the right common femoral artery. 4. Selective catheterization  and angiography of the right renal artery. 5. Sub selective catheterization angiography of right inferior polar and arcuate artery branches. 6. Coil embolization of right inferior pole arcuate artery branch. MEDICATIONS: None. ANESTHESIA/SEDATION: Moderate (conscious) sedation was employed during this procedure. A total of Versed 3 mg and Fentanyl 50 mcg was administered intravenously. Moderate Sedation Time: 66 minutes. The patient's level of consciousness and vital signs were monitored continuously by radiology nursing throughout the procedure under my direct supervision. CONTRAST:  66mL OMNIPAQUE IOHEXOL 350 MG/ML SOLN, 43mL OMNIPAQUE IOHEXOL 350 MG/ML SOLN FLUOROSCOPY TIME:  Fluoroscopy Time: 10.6 minutes, (811 mGy). COMPLICATIONS: None immediate. PROCEDURE: Informed consent was obtained from the patient following explanation of the procedure, risks, benefits and alternatives. The patient understands, agrees and consents for the procedure. All questions were addressed. A time out was performed prior to the initiation of the procedure. Maximal  barrier sterile technique utilized including caps, mask, sterile gowns, sterile gloves, large sterile drape, hand hygiene, and chlorhexidine prep. Preprocedure ultrasound evaluation of the right internal jugular vein demonstrated a patent and compressible vein free of internal echoes. Procedure was planned. Subdermal Local anesthesia was provided 1% lidocaine. A small skin nick was made. Under direct ultrasound visualization, a 21 gauge micropuncture needle was directed into the internal jugular vein. An image was captured and stored in the permanent record. A micropuncture set was inserted and exchanged for a J wire which was positioned in the inferior vena cava. Serial dilation was performed followed by placement of a 12.5 French, 24 cm Trialysis catheter. The catheter tip was positioned in the right atrium. Each lumen flushed and aspirated appropriately. The dialysis  ports were locked with appropriate volume of heparin dwell. The middle central venous port was then used for sedation for the remainder of the procedure. The catheter was secured with a 0 silk retention suture. A sterile bandage was applied. Preprocedure ultrasound evaluation of the right groin was performed which demonstrated a patent right common femoral artery. The procedure was planned. Subdermal Local anesthesia was provided with 1% lidocaine. A small skin nick was made. Under direct ultrasound visualization, a 21 gauge micropuncture needle was directed into the common femoral artery. An ultrasound image was captured and stored in the permanent record. A micro puncture set was inserted and a limited right lower extremity angiogram was performed which demonstrated appropriate puncture site for closure device use. A J wire was directed to the abdominal aorta and the micropuncture set was exchanged for a 5 [place] vascular sheath. A 5 French C2 catheter was then directed into the right renal ostium. Right renal angiogram was performed. The single main renal artery was patent. 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 A straight lantern microcatheter and 0.014" soft synchro wire was then inserted and directed into the inferior polar branch. Repeat angiogram was performed in the sub selective location which was significant for multifocal 2-4 mm pseudoaneurysm formation, abnormal truncation and irregularity of the arcuate and interlobular branches, an early arteriovenous shunting. The inferior polar arcuate branch was selected further. Coil embolization was performed with multiple low profile Penumbra Ruby coils ranging from 2-3 mm in diameter. Completion right inferior renal angiogram was performed which  demonstrated appropriate embolization of the targeted vessels without persistent arteriovenous fistula or vessel irregularity. The catheters were removed. The right common femoral artery was then closed with a 6 [place] Angio-Seal device. Distal pulses were unchanged. The patient tolerated the procedure well was transferred back to the floor in good condition. IMPRESSION: 1. 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 2. Sub selective coil embolization of right inferior polar arcuate artery branch. 3. Successful placement of right internal jugular, 24 French Trialysis catheter with the catheter tip in the right atrium. The catheter is ready for immediate use. Ruthann Cancer, MD [facility] Radiology Electronically Signed   By: Ruthann Cancer M.D.   On: 08/31/2021 09:29   IR US Guide Vasc Access Right  Result Date: 08/31/2021 INDICATION: 70 year old male with history of acute kidney injury of uncertain etiology status post ultrasound-guided right renal biopsy on 08/28/2021. Since  biopsy, the patient has experienced gross hematuria with associated acute anemia. EXAM: 1. Ultrasound-guided vascular access of the right internal jugular vein. 2. Temporary hemodialysis catheter placement. 3. Ultrasound-guided vascular access of the right common femoral artery. 4. Selective catheterization and angiography of the right renal artery. 5. Sub selective catheterization angiography of right inferior polar and arcuate artery branches. 6. Coil embolization of right inferior pole arcuate artery branch. MEDICATIONS: None. ANESTHESIA/SEDATION: Moderate (conscious) sedation was employed during this procedure. A total of Versed 3 mg and Fentanyl 50 mcg was administered intravenously. Moderate Sedation Time: 66 minutes. The patient's level of  consciousness and vital signs were monitored continuously by radiology nursing throughout the procedure under my direct supervision. CONTRAST:  31mL OMNIPAQUE IOHEXOL 350 MG/ML SOLN, 40mL OMNIPAQUE IOHEXOL 350 MG/ML SOLN FLUOROSCOPY TIME:  Fluoroscopy Time: 10.6 minutes, (811 mGy). COMPLICATIONS: None immediate. PROCEDURE: Informed consent was obtained from the patient following explanation of the procedure, risks, benefits and alternatives. The patient understands, agrees and consents for the procedure. All questions were addressed. A time out was performed prior to the initiation of the procedure. Maximal barrier sterile technique utilized including caps, mask, sterile gowns, sterile gloves, large sterile drape, hand hygiene, and chlorhexidine prep. Preprocedure ultrasound evaluation of the right internal jugular vein demonstrated a patent and compressible vein free of internal echoes. Procedure was planned. Subdermal Local anesthesia was provided 1% lidocaine. A small skin nick was made. Under direct ultrasound visualization, a 21 gauge micropuncture needle was directed into the internal jugular vein. An image was captured and stored in the permanent record. A micropuncture set was inserted and exchanged for a J wire which was positioned in the inferior vena cava. Serial dilation was performed followed by placement of a 12.5 French, 24 cm Trialysis catheter. The catheter tip was positioned in the right atrium. Each lumen flushed and aspirated appropriately. The dialysis ports were locked with appropriate volume of heparin dwell. The middle central venous port was then used for sedation for the remainder of the procedure. The catheter was secured with a 0 silk retention suture. A sterile bandage was applied. Preprocedure ultrasound evaluation of the right groin was performed which demonstrated a patent right common femoral artery. The procedure was planned. Subdermal Local anesthesia was provided with 1%  lidocaine. A small skin nick was made. Under direct ultrasound visualization, a 21 gauge micropuncture needle was directed into the common femoral artery. An ultrasound image was captured and stored in the permanent record. A micro puncture set was inserted and a limited right lower extremity angiogram was performed which demonstrated appropriate puncture site for closure device use. A J wire was directed to the abdominal aorta and the micropuncture set was exchanged for a 5 [place] vascular sheath. A 5 French C2 catheter was then directed into the right renal ostium. Right renal angiogram was performed. The single main renal artery was patent. 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 A straight lantern microcatheter and 0.014" soft synchro wire was then inserted and directed into the inferior polar branch. Repeat angiogram was performed in the sub selective location which was significant for multifocal 2-4 mm pseudoaneurysm formation, abnormal truncation and irregularity of the arcuate and interlobular branches, an early arteriovenous shunting. The inferior polar arcuate branch was selected further. Coil embolization was performed with multiple low profile Penumbra  Ruby coils ranging from 2-3 mm in diameter. Completion right inferior renal angiogram was performed which demonstrated appropriate embolization of the targeted vessels without persistent arteriovenous fistula or vessel irregularity. The catheters were removed. The right common femoral artery was then closed with a 6 [place] Angio-Seal device. Distal pulses were unchanged. The patient tolerated the procedure well was transferred back to the floor in good condition. IMPRESSION: 1. 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 2. Sub selective coil embolization of right inferior polar arcuate artery branch. 3. Successful placement of right internal jugular, 24 French Trialysis catheter with the catheter tip in the right atrium. The catheter is ready for immediate use. Ruthann Cancer, MD [facility] Radiology Electronically Signed   By: Ruthann Cancer M.D.   On: 08/31/2021 09:29   US BIOPSY (KIDNEY)  Result Date: 08/28/2021 INDICATION: Acute kidney injury of uncertain etiology. Please perform image guided biopsy for tissue diagnostic purposes. EXAM: ULTRASOUND GUIDED RENAL BIOPSY COMPARISON:  CT abdomen and pelvis-08/23/2021 MEDICATIONS: None. ANESTHESIA/SEDATION: Fentanyl 100 mcg IV; Versed 2 mg IV Total Moderate Sedation time: 14 minutes; The patient was continuously monitored during the procedure by the interventional radiology nurse under my direct supervision. COMPLICATIONS: None immediate. PROCEDURE: Informed written consent was obtained from the patient after a discussion of the risks, benefits and alternatives to treatment. The patient understands and consents the procedure. A timeout was performed prior to the initiation of the procedure. Ultrasound scanning was performed of the bilateral flanks. The inferior pole of the right kidney was selected for biopsy due to location and sonographic window. The procedure was planned. The operative site was prepped and draped in the usual sterile fashion. The overlying soft tissues were anesthetized with 1% lidocaine with epinephrine. A 17 gauge core needle biopsy device was advanced into the inferior cortex of the right kidney and 3 core biopsies were obtained under direct ultrasound guidance. Images were saved for documentation purposes. The biopsy device was removed and hemostasis was obtained with manual  compression. Post procedural scanning was negative for significant post procedural hemorrhage or additional complication. A dressing was placed. The patient tolerated the procedure well without immediate post procedural complication. IMPRESSION: Technically successful ultrasound guided right renal biopsy. Electronically Signed   By: Sandi Mariscal M.D.   On: 08/28/2021 12:52   IR EMBO ART  VEN HEMORR LYMPH EXTRAV  INC GUIDE ROADMAPPING  Result Date: 08/31/2021 INDICATION: 70 year old male with history of acute kidney injury of uncertain etiology status post ultrasound-guided right renal biopsy on 08/28/2021. Since biopsy, the patient has experienced gross hematuria with associated acute anemia. EXAM: 1. Ultrasound-guided vascular access of the right internal jugular vein. 2. Temporary hemodialysis catheter placement. 3. Ultrasound-guided vascular access of the right common femoral artery. 4. Selective catheterization and angiography of the right renal artery. 5. Sub selective catheterization angiography of right inferior polar and arcuate artery branches. 6. Coil embolization of right inferior pole arcuate artery branch. MEDICATIONS: None. ANESTHESIA/SEDATION: Moderate (conscious) sedation was employed during this procedure. A total of Versed 3 mg and Fentanyl 50 mcg was administered intravenously. Moderate Sedation Time: 66 minutes. The patient's level of consciousness and vital signs were monitored continuously by radiology nursing throughout the procedure under my direct supervision. CONTRAST:  34mL OMNIPAQUE IOHEXOL 350 MG/ML SOLN, 23mL OMNIPAQUE IOHEXOL 350 MG/ML SOLN FLUOROSCOPY TIME:  Fluoroscopy Time: 10.6 minutes, (811 mGy). COMPLICATIONS: None  immediate. PROCEDURE: Informed consent was obtained from the patient following explanation of the procedure, risks, benefits and alternatives. The patient understands, agrees and consents for the procedure. All questions were addressed. A time out was performed prior  to the initiation of the procedure. Maximal barrier sterile technique utilized including caps, mask, sterile gowns, sterile gloves, large sterile drape, hand hygiene, and chlorhexidine prep. Preprocedure ultrasound evaluation of the right internal jugular vein demonstrated a patent and compressible vein free of internal echoes. Procedure was planned. Subdermal Local anesthesia was provided 1% lidocaine. A small skin nick was made. Under direct ultrasound visualization, a 21 gauge micropuncture needle was directed into the internal jugular vein. An image was captured and stored in the permanent record. A micropuncture set was inserted and exchanged for a J wire which was positioned in the inferior vena cava. Serial dilation was performed followed by placement of a 12.5 French, 24 cm Trialysis catheter. The catheter tip was positioned in the right atrium. Each lumen flushed and aspirated appropriately. The dialysis ports were locked with appropriate volume of heparin dwell. The middle central venous port was then used for sedation for the remainder of the procedure. The catheter was secured with a 0 silk retention suture. A sterile bandage was applied. Preprocedure ultrasound evaluation of the right groin was performed which demonstrated a patent right common femoral artery. The procedure was planned. Subdermal Local anesthesia was provided with 1% lidocaine. A small skin nick was made. Under direct ultrasound visualization, a 21 gauge micropuncture needle was directed into the common femoral artery. An ultrasound image was captured and stored in the permanent record. A micro puncture set was inserted and a limited right lower extremity angiogram was performed which demonstrated appropriate puncture site for closure device use. A J wire was directed to the abdominal aorta and the micropuncture set was exchanged for a 5 [place] vascular sheath. A 5 French C2 catheter was then directed into the right renal ostium. Right  renal angiogram was performed. The single main renal artery was patent. 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 A straight lantern microcatheter and 0.014" soft synchro wire was then inserted and directed into the inferior polar branch. Repeat angiogram was performed in the sub selective location which was significant for multifocal 2-4 mm pseudoaneurysm formation, abnormal truncation and irregularity of the arcuate and interlobular branches, an early arteriovenous shunting. The inferior polar arcuate branch was selected further. Coil embolization was performed with multiple low profile Penumbra Ruby coils ranging from 2-3 mm in diameter. Completion right inferior renal angiogram was performed which demonstrated appropriate embolization of the targeted vessels without persistent arteriovenous fistula or vessel irregularity. The catheters were removed. The right common femoral artery was then closed with a 6 [place] Angio-Seal device. Distal pulses were unchanged. The patient tolerated the procedure well was transferred back to the floor in good condition. IMPRESSION: 1. 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 2. Sub selective coil embolization of right inferior polar arcuate artery branch. 3. Successful placement of right internal jugular, 24 French Trialysis catheter with the catheter tip in the right atrium. The catheter is ready for immediate use. Ruthann Cancer, MD [facility] Radiology Electronically Signed  By: Ruthann Cancer M.D.   On: 08/31/2021 09:29    Labs:  CBC: Recent Labs    08/29/21 0637  08/30/21 0241 08/30/21 1556 08/31/21 0230 08/31/21 1529  WBC 12.3* 12.8*  --  11.2* 11.5*  HGB 7.4* 6.4* 7.4* 7.1* 7.6*  HCT 24.1* 20.8* 23.2* 22.5* 24.5*  PLT 236 239  --  226 246    COAGS: Recent Labs    08/24/21 1128 08/28/21 0305 08/29/21 0637 08/30/21 0241  INR 1.6* 1.5* 1.7* 1.5*    BMP: Recent Labs    08/29/21 0040 08/30/21 0241 08/31/21 0230 09/01/21 0212  NA 137 136 139 138  K 4.1 3.8 4.1 3.9  CL 109 104 106 104  CO2 20* 23 23 23   GLUCOSE 113* 86 106* 103*  BUN 34* 35* 34* 36*  CALCIUM 8.1* 8.3* 8.3* 8.2*  CREATININE 5.06* 5.56* 5.93* 5.98*  GFRNONAA 11* 10* 10* 9*    LIVER FUNCTION TESTS: Recent Labs    08/23/21 1037 08/24/21 0055 08/25/21 0758 08/26/21 1036 08/27/21 0029 08/29/21 0040 08/30/21 0241 08/31/21 0230 09/01/21 0212  BILITOT 0.3 0.4  --  0.8  --   --   --   --   --   AST 17 14*  --  14*  --   --   --   --   --   ALT 10 10  --  9  --   --   --   --   --   ALKPHOS 66 57  --  56  --   --   --   --   --   PROT 5.6* 4.8*  --  5.0*  --   --   --   --   --   ALBUMIN 2.2* 1.9*   < > 1.9*   < > 1.6* 1.6* 1.7* 2.0*   < > = values in this interval not displayed.    Assessment and Plan:  Random renal bx 9/6--- post bleed Renal artery embolization in IR 9/8 Stable Hgb Will follow  Electronically Signed: Lavonia Drafts, PA-C 09/01/2021, 10:46 AM   I spent a total of 15 Minutes at the the patient's bedside AND on the patient's hospital floor or unit, greater than 50% of which was counseling/coordinating care for random renal bx with post bleed--- embolization 9/8

## 2021-09-02 DIAGNOSIS — N179 Acute kidney failure, unspecified: Secondary | ICD-10-CM | POA: Diagnosis not present

## 2021-09-02 DIAGNOSIS — D649 Anemia, unspecified: Secondary | ICD-10-CM | POA: Diagnosis not present

## 2021-09-02 LAB — RENAL FUNCTION PANEL
Albumin: 2 g/dL — ABNORMAL LOW (ref 3.5–5.0)
Anion gap: 10 (ref 5–15)
BUN: 37 mg/dL — ABNORMAL HIGH (ref 8–23)
CO2: 24 mmol/L (ref 22–32)
Calcium: 8.1 mg/dL — ABNORMAL LOW (ref 8.9–10.3)
Chloride: 103 mmol/L (ref 98–111)
Creatinine, Ser: 5.82 mg/dL — ABNORMAL HIGH (ref 0.61–1.24)
GFR, Estimated: 10 mL/min — ABNORMAL LOW (ref >60)
Glucose, Bld: 98 mg/dL (ref 70–99)
Phosphorus: 5.2 mg/dL — ABNORMAL HIGH (ref 2.5–4.6)
Potassium: 3.9 mmol/L (ref 3.5–5.1)
Sodium: 137 mmol/L (ref 135–145)

## 2021-09-02 LAB — BPAM RBC
Blood Product Expiration Date: 202210012359
Blood Product Expiration Date: 202210052359
ISSUE DATE / TIME: 202209101734
ISSUE DATE / TIME: 202209102134
Unit Type and Rh: 600
Unit Type and Rh: 600

## 2021-09-02 LAB — TYPE AND SCREEN
ABO/RH(D): A NEG
Antibody Screen: NEGATIVE
Unit division: 0
Unit division: 0

## 2021-09-02 LAB — HEMOGLOBIN AND HEMATOCRIT, BLOOD
HCT: 26.3 % — ABNORMAL LOW (ref 39.0–52.0)
Hemoglobin: 8.4 g/dL — ABNORMAL LOW (ref 13.0–17.0)

## 2021-09-02 MED ORDER — TRAMADOL HCL 50 MG PO TABS
50.0000 mg | ORAL_TABLET | Freq: Three times a day (TID) | ORAL | Status: DC | PRN
  Administered 2021-09-02 – 2021-09-21 (×18): 50 mg via ORAL
  Filled 2021-09-02 (×19): qty 1

## 2021-09-02 NOTE — Progress Notes (Signed)
Subjective: Patient alert conversive no complaints of pain.  He continues to have gross hematuria but states he is not passing any significant clots.  States the urine is slow but feels like he is emptying his bladder.  Bladder scan done yesterday showed about 500 cc but the patient states he had not emptied his bladder prior to the bladder scan and subsequently emptied approximately 3 to 400 cc when he voided after the bladder scan.  Objective: Vital signs in last 24 hours: Temp:  [97.9 F (36.6 C)-99.8 F (37.7 C)] 97.9 F (36.6 C) (09/11 0505) Pulse Rate:  [68-92] 79 (09/11 0505) Resp:  [11-20] 14 (09/11 0505) BP: (132-159)/(79-89) 159/86 (09/11 0505) SpO2:  [93 %-98 %] 94 % (09/11 0505)  Intake/Output from previous day: 09/10 0701 - 09/11 0700 In: 1100.3 [P.O.:360; I.V.:18.3; Blood:722] Out: 2825 [Urine:2825] Intake/Output this shift: Total I/O In: -  Out: 300 [Urine:300]  Physical Exam:  General: Alert and oriented Abdomen: Soft, ND, bladder not palpable Lab Results: Recent Labs    08/31/21 1529 09/01/21 0951 09/02/21 0151  HGB 7.6* 6.6* 8.4*  HCT 24.5* 21.4* 26.3*   BMET Recent Labs    09/01/21 0212 09/02/21 0127  NA 138 137  K 3.9 3.9  CL 104 103  CO2 23 24  GLUCOSE 103* 98  BUN 36* 37*  CREATININE 5.98* 5.82*  CALCIUM 8.2* 8.1*     Studies/Results: IR Angiogram Renal Left Selective  Result Date: 08/31/2021 INDICATION: 70 year old male with history of acute kidney injury of uncertain etiology status post ultrasound-guided right renal biopsy on 08/28/2021. Since biopsy, the patient has experienced gross hematuria with associated acute anemia.   EXAM: 1. Ultrasound-guided vascular access of the right internal jugular vein. 2. Temporary hemodialysis catheter placement. 3. Ultrasound-guided vascular access of the right common femoral artery. 4. Selective catheterization and angiography of the right renal artery. 5. Sub selective catheterization angiography  of right inferior polar and arcuate artery branches. 6. Coil embolization of right inferior pole arcuate artery branch.   MEDICATIONS: None.   ANESTHESIA/SEDATION: Moderate (conscious) sedation was employed during this procedure. A total of Versed 3 mg and Fentanyl 50 mcg was administered intravenously.   Moderate Sedation Time: 66 minutes. The patient's level of consciousness and vital signs were monitored continuously by radiology nursing throughout the procedure under my direct supervision.   CONTRAST:  3mL OMNIPAQUE IOHEXOL 350 MG/ML SOLN, 74mL OMNIPAQUE IOHEXOL 350 MG/ML SOLN   FLUOROSCOPY TIME:  Fluoroscopy Time: 10.6 minutes, (811 mGy).   COMPLICATIONS: None immediate.   PROCEDURE: Informed consent was obtained from the patient following explanation of the procedure, risks, benefits and alternatives. The patient understands, agrees and consents for the procedure. All questions were addressed. A time out was performed prior to the initiation of the procedure. Maximal barrier sterile technique utilized including caps, mask, sterile gowns, sterile gloves, large sterile drape, hand hygiene, and chlorhexidine prep.   Preprocedure ultrasound evaluation of the right internal jugular vein demonstrated a patent and compressible vein free of internal echoes. Procedure was planned. Subdermal Local anesthesia was provided 1% lidocaine. A small skin nick was made. Under direct ultrasound visualization, a 21 gauge micropuncture needle was directed into the internal jugular vein. An image was captured and stored in the permanent record. A micropuncture set was inserted and exchanged for a J wire which was positioned in the inferior vena cava. Serial dilation was performed followed by placement of a 12.5 French, 24 cm Trialysis catheter. The catheter tip was positioned in  the right atrium. Each lumen flushed and aspirated appropriately. The dialysis ports were locked with appropriate volume of heparin dwell. The middle  central venous port was then used for sedation for the remainder of the procedure. The catheter was secured with a 0 silk retention suture. A sterile bandage was applied.   Preprocedure ultrasound evaluation of the right groin was performed which demonstrated a patent right common femoral artery. The procedure was planned. Subdermal Local anesthesia was provided with 1% lidocaine. A small skin nick was made. Under direct ultrasound visualization, a 21 gauge micropuncture needle was directed into the common femoral artery. An ultrasound image was captured and stored in the permanent record. A micro puncture set was inserted and a limited right lower extremity angiogram was performed which demonstrated appropriate puncture site for closure device use. A J wire was directed to the abdominal aorta and the micropuncture set was exchanged for a 5 [place] vascular sheath. A 5 French C2 catheter was then directed into the right renal ostium. Right renal angiogram was performed. The single main renal artery was patent. 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   A straight lantern microcatheter and 0.014" soft synchro wire was then inserted and directed into the inferior polar branch. Repeat angiogram was performed in the sub selective location which was significant for multifocal 2-4 mm pseudoaneurysm formation, abnormal truncation and irregularity of the arcuate and interlobular branches, an early arteriovenous shunting. The inferior polar arcuate branch was selected further. Coil embolization was performed with multiple low profile Penumbra Ruby coils ranging from 2-3 mm in diameter. Completion right inferior renal angiogram was performed which demonstrated appropriate embolization of the targeted vessels without  persistent arteriovenous fistula or vessel irregularity.   The catheters were removed. The right common femoral artery was then closed with a 6 [place] Angio-Seal device. Distal pulses were unchanged. The patient tolerated the procedure well was transferred back to the floor in good condition.   IMPRESSION: 1. 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 2. Sub selective coil embolization of right inferior polar arcuate artery branch. 3. Successful placement of right internal jugular, 24 French Trialysis catheter with the catheter tip in the right atrium. The catheter is ready for immediate use.   Ruthann Cancer, MD   [facility]   [facility] Radiology     Electronically Signed   By: Ruthann Cancer M.D.   On: 08/31/2021 09:29     Assessment/Plan: 1.  Persistent hematuria status post renal biopsy and subsequent embolization.  No evidence of clot retention.  Patient voiding satisfactorily.  I discussed perhaps replacing Foley but since he is voiding satisfactorily we will follow expectantly for now.  Patient agreeable with this.  He will let us know if he is having difficulty voiding and may require placement of irrigation catheter again.    LOS: 10 days   Remi Haggard 09/02/2021, 9:51 AM

## 2021-09-02 NOTE — Progress Notes (Signed)
PROGRESS NOTE        PATIENT DETAILS Name: Caleb Simmons Age: 70 y.o. Sex: male Date of Birth: 1951/09/14 Admit Date: 08/23/2021 Admitting Physician Costin Karlyne Greenspan, MD PCP:Pcp, No  Brief Narrative:  Patient is a 69 y.o. male with history of nephrolithiasis-presenting with generalized weakness/shortness of breath-found to have AKI and severe normocytic anemia.  Patient was evaluated by nephrology-with concerns that patient's AKI was related to a glomerular pathology-subsequently underwent right renal biopsy on 9/6-unfortunately postrenal biopsy-patient developed bleeding into the renal collecting system-with clot retention requiring insertion of three-way catheter and CBI.  Patient continued to have renal bleeding-on 9/8-IR performed embolization.    See below for further details.  Significant events: 9/1>> admit for evaluation of AKI/severe anemia. 9/6>> ultrasound-guided biopsy of right kidney 9/6>> developed hematuria followed by acute urinary retention-presyncope and hypotension post kidney biopsy.  Three-way Foley catheter placed and CBI started. 9/8>> hematuria-hemoglobin down to 6.4-IR performed embolization and TDC placement.  Significant studies: 9/1>> CXR: No pneumonia 9/1>> VQ scan: No PE 9/1>> CT renal stone study: No hydronephrosis, distended bladder.  Diverticulosis. 9/1>> FOBT: Negative 9/1>> UA: Protein>> 300, RBCs 11-20/hpf 9/1>> HIV: Nonreactive 9/1>> HBsAg/HCV Ab: Nonreactive 9/1>> C3/C4: Normal limits 9/1>> glomerular basement Ab: Negative 9/1>> ANA: Negative 9/1>> dsDNA: Negative 9/1>> ANCA titers: Negative 9/1>> SPEP: no M  spike 9/2>> Echo: EF 51-88%, grade 1 diastolic dysfunction. 9/4>>Folic acid :4.1(YSA) 6/3>>KZS B12:298 9/6>> CT abdomen/pelvis: Tiny perinephric hematoma, bleeding into the right renal collecting system 9/8>> CT abdomen/pelvis: Substantial clot burden within the bladder-small perinephric  hematoma   Antimicrobial therapy: None  Microbiology data: 9/1>> COVID PCR: Negative  Procedures : 9/06>> ultrasound-guided biopsy of right kidney 9/8>> 1) Right IJ temporary hemodialysis catheter placement 2) Right renal angiogram 3) Selective catheterization and embolization of right inferior pole arcuate artery  Consults: Nephrology, IR, urology  DVT Prophylaxis : Place and maintain sequential compression device Start: 09/01/21 1335 Place and maintain sequential compression device Start: 08/24/21 0519   Subjective:  Patient with hematuria, patient reports she is able to pass urine, he had some elevated readings of bladder scan, but he was able to urinate with no difficulty after that.     Assessment/Plan:  Acute kidney injury:  Suspicion for glomerular pathology-extensive serological work-up negative-awaiting results of renal biopsy.  Creatinine had stabilized to around 5-however due to bleeding post biopsy/blood loss anemia-creatinine is slowly uptrending-however no indications for RRT yet.  Nephrology following-dialysis catheter placed by IR yesterday in case patient requires HD (concern for further worsening of AKI-contrast nephropathy with embolization procedure). -Management per nephrology  Intake/Output Summary (Last 24 hours) at 09/02/2021 1316 Last data filed at 09/02/2021 0925 Gross per 24 hour  Intake 740.33 ml  Output 2250 ml  Net -1509.67 ml    Hypotension/presyncope-hematuria-acute urinary retention (clot retention) due to bleeding into the collecting system of the right kidney post renal biopsy on 9/6-requiring three-way Foley catheter insertion and CBI and embolization by IR on 9/8 :  -Management per urology, Foley catheter discontinued 9/8, he does remain with some hematuria, but still passing urine, will continue to monitor for signs of retention closely , continue to monitor with bladder scan . -Trend CBC closely and transfuse as needed.       Microcytic anemia: Multifactorial anemia-from AKI/acute illness/vitamin B12 and folate deficiency-but lately due to blood loss post kidney biopsy.  Has required several units of PRBC transfusion-hemoglobin again downtrending today-repeat CBC this afternoon.  Acute blood loss anemia due to significant hematuria -Hemoglobin was 6.6 yesterday, patient was transfused 2 units PRBC, hemoglobin has improved to 8.4 today.  Minimally elevated troponins: Trend is flat-not consistent with ACS-he does not have any chest pain.  Exertional dyspnea was from symptomatic anemia.  Suspect elevated troponin is in the setting of kidney failure.  Echo without any wall motion malady and preserved EF.  Doubt any further work-up is required-especially in light of severity of anemia and AKI.  Anxiety: Continue Xanax while he is in the hospital-is aware that we will likely not continue Xanax post discharge.  Use trazodone for sleep.  Patient is not interested in starting long-acting medications like SSRI for his anxiety-thinks that once he gets home-he will not require any further medications for his anxiety issues.  Tobacco abuse: Continue transdermal nicotine. Diet: Diet Order             Diet regular Room service appropriate? Yes; Fluid consistency: Thin  Diet effective now                    Code Status: Full code   Family Communication: None at bedside  Disposition Plan: Status is: Inpatient  Remains inpatient appropriate because:Inpatient level of care appropriate due to severity of illness  Dispo: The patient is from: Home              Anticipated d/c is to: Home              Patient currently is not medically stable to d/c.   Difficult to place patient No       Antimicrobial agents: Anti-infectives (From admission, onward)    None        MEDICATIONS: Scheduled Meds:  Chlorhexidine Gluconate Cloth  6 each Topical Daily   folic acid  2 mg Oral Daily   melatonin  5 mg Oral QHS    nicotine  14 mg Transdermal Q24H   pantoprazole  40 mg Oral BID AC   sodium bicarbonate  1,300 mg Oral TID   tamsulosin  0.4 mg Oral Daily   Continuous Infusions:  sodium chloride     sodium chloride 10 mL/hr at 08/27/21 2147   lactated ringers 75 mL/hr at 09/02/21 1000   sodium chloride irrigation     PRN Meds:.acetaminophen, ALPRAZolam, bisacodyl, diphenhydrAMINE, heparin sodium (porcine), HYDROmorphone (DILAUDID) injection, lidocaine, midazolam, midazolam, ondansetron **OR** ondansetron (ZOFRAN) IV, promethazine, technetium albumin aggregated, traZODone   PHYSICAL EXAM: Vital signs: Vitals:   09/01/21 2200 09/02/21 0015 09/02/21 0027 09/02/21 0505  BP: (!) 141/79  (!) 150/87 (!) 159/86  Pulse: 84  68 79  Resp: 14  20 14   Temp: 98 F (36.7 C)  97.9 F (36.6 C) 97.9 F (36.6 C)  TempSrc: Oral Oral Oral Oral  SpO2: 95%  95% 94%  Weight:      Height:       Filed Weights   08/23/21 0816  Weight: 81.6 kg   Body mass index is 25.83 kg/m.   Awake Alert, Oriented X 3, No new F.N deficits, Normal affect Symmetrical Chest wall movement, Good air movement bilaterally, CTAB RRR,No Gallops,Rubs or new Murmurs, No Parasternal Heave +ve B.Sounds, Abd Soft, No tenderness, No rebound - guarding or rigidity. No Cyanosis, Clubbing or edema, No new Rash or bruise     I have personally reviewed following labs and imaging studies  LABORATORY DATA:  CBC: Recent Labs  Lab 08/28/21 0305 08/28/21 1617 08/29/21 0637 08/30/21 0241 08/30/21 1556 08/31/21 0230 08/31/21 1529 09/01/21 0951 09/02/21 0151  WBC 9.3   < > 12.3* 12.8*  --  11.2* 11.5* 11.3*  --   NEUTROABS 5.8  --   --   --   --   --   --   --   --   HGB 8.4*   < > 7.4* 6.4* 7.4* 7.1* 7.6* 6.6* 8.4*  HCT 26.8*   < > 24.1* 20.8* 23.2* 22.5* 24.5* 21.4* 26.3*  MCV 82.2   < > 84.6 85.2  --  84.3 85.1 86.3  --   PLT 263   < > 236 239  --  226 246 246  --    < > = values in this interval not displayed.    Basic  Metabolic Panel: Recent Labs  Lab 08/29/21 0040 08/30/21 0241 08/31/21 0230 09/01/21 0212 09/02/21 0127  NA 137 136 139 138 137  K 4.1 3.8 4.1 3.9 3.9  CL 109 104 106 104 103  CO2 20* 23 23 23 24   GLUCOSE 113* 86 106* 103* 98  BUN 34* 35* 34* 36* 37*  CREATININE 5.06* 5.56* 5.93* 5.98* 5.82*  CALCIUM 8.1* 8.3* 8.3* 8.2* 8.1*  PHOS 6.5* 5.2* 5.8* 4.5 5.2*    GFR: Estimated Creatinine Clearance: 12 mL/min (A) (by C-G formula based on SCr of 5.82 mg/dL (H)).  Liver Function Tests: Recent Labs  Lab 08/29/21 0040 08/30/21 0241 08/31/21 0230 09/01/21 0212 09/02/21 0127  ALBUMIN 1.6* 1.6* 1.7* 2.0* 2.0*   No results for input(s): LIPASE, AMYLASE in the last 168 hours.  No results for input(s): AMMONIA in the last 168 hours.  Coagulation Profile: Recent Labs  Lab 08/28/21 0305 08/29/21 0637 08/30/21 0241  INR 1.5* 1.7* 1.5*    Cardiac Enzymes: No results for input(s): CKTOTAL, CKMB, CKMBINDEX, TROPONINI in the last 168 hours.  BNP (last 3 results) No results for input(s): PROBNP in the last 8760 hours.  Lipid Profile: No results for input(s): CHOL, HDL, LDLCALC, TRIG, CHOLHDL, LDLDIRECT in the last 72 hours.  Thyroid Function Tests: No results for input(s): TSH, T4TOTAL, FREET4, T3FREE, THYROIDAB in the last 72 hours.  Anemia Panel: No results for input(s): VITAMINB12, FOLATE, FERRITIN, TIBC, IRON, RETICCTPCT in the last 72 hours.   Urine analysis:    Component Value Date/Time   COLORURINE STRAW (A) 08/23/2021 1140   APPEARANCEUR CLEAR 08/23/2021 1140   LABSPEC 1.008 08/23/2021 1140   PHURINE 6.0 08/23/2021 1140   GLUCOSEU 50 (A) 08/23/2021 1140   HGBUR SMALL (A) 08/23/2021 1140   BILIRUBINUR NEGATIVE 08/23/2021 1140   KETONESUR NEGATIVE 08/23/2021 1140   PROTEINUR >=300 (A) 08/23/2021 1140   UROBILINOGEN 0.2 06/26/2007 1023   NITRITE NEGATIVE 08/23/2021 1140   LEUKOCYTESUR NEGATIVE 08/23/2021 1140    Sepsis Labs: Lactic Acid, Venous No results  found for: LATICACIDVEN  MICROBIOLOGY: Recent Results (from the past 240 hour(s))  SARS CORONAVIRUS 2 (TAT 6-24 HRS) Nasopharyngeal Nasopharyngeal Swab     Status: None   Collection Time: 08/23/21  5:11 PM   Specimen: Nasopharyngeal Swab  Result Value Ref Range Status   SARS Coronavirus 2 NEGATIVE NEGATIVE Final    Comment: (NOTE) SARS-CoV-2 target nucleic acids are NOT DETECTED.  The SARS-CoV-2 RNA is generally detectable in upper and lower respiratory specimens during the acute phase of infection. Negative results do not preclude SARS-CoV-2 infection, do not rule out co-infections with other pathogens, and  should not be used as the sole basis for treatment or other patient management decisions. Negative results must be combined with clinical observations, patient history, and epidemiological information. The expected result is Negative.  Fact Sheet for Patients: SugarRoll.be  Fact Sheet for Healthcare Providers: https://www.woods-mathews.com/  This test is not yet approved or cleared by the [place] FDA and  has been authorized for detection and/or diagnosis of SARS-CoV-2 by FDA under an Emergency Use Authorization (EUA). This EUA will remain  in effect (meaning this test can be used) for the duration of the COVID-19 declaration under Se ction 564(b)(1) of the Act, 21 U.S.C. section 360bbb-3(b)(1), unless the authorization is terminated or revoked sooner.  Performed at [facility], [place] [address], [place], [place] [address]     RADIOLOGY STUDIES/RESULTS: IR Angiogram Renal Left Selective  Result Date: 08/31/2021 INDICATION: 70 year old male with history of acute kidney injury of uncertain etiology status post ultrasound-guided right renal biopsy on 08/28/2021. Since biopsy, the patient has experienced gross hematuria with associated acute anemia.   EXAM: 1. Ultrasound-guided vascular access of the right internal jugular  vein. 2. Temporary hemodialysis catheter placement. 3. Ultrasound-guided vascular access of the right common femoral artery. 4. Selective catheterization and angiography of the right renal artery. 5. Sub selective catheterization angiography of right inferior polar and arcuate artery branches. 6. Coil embolization of right inferior pole arcuate artery branch.   MEDICATIONS: None.   ANESTHESIA/SEDATION: Moderate (conscious) sedation was employed during this procedure. A total of Versed 3 mg and Fentanyl 50 mcg was administered intravenously.   Moderate Sedation Time: 66 minutes. The patient's level of consciousness and vital signs were monitored continuously by radiology nursing throughout the procedure under my direct supervision.   CONTRAST:  50mL OMNIPAQUE IOHEXOL 350 MG/ML SOLN, 70mL OMNIPAQUE IOHEXOL 350 MG/ML SOLN   FLUOROSCOPY TIME:  Fluoroscopy Time: 10.6 minutes, (811 mGy).   COMPLICATIONS: None immediate.   PROCEDURE: Informed consent was obtained from the patient following explanation of the procedure, risks, benefits and alternatives. The patient understands, agrees and consents for the procedure. All questions were addressed. A time out was performed prior to the initiation of the procedure. Maximal barrier sterile technique utilized including caps, mask, sterile gowns, sterile gloves, large sterile drape, hand hygiene, and chlorhexidine prep.   Preprocedure ultrasound evaluation of the right internal jugular vein demonstrated a patent and compressible vein free of internal echoes. Procedure was planned. Subdermal Local anesthesia was provided 1% lidocaine. A small skin nick was made. Under direct ultrasound visualization, a 21 gauge micropuncture needle was directed into the internal jugular vein. An image was captured and stored in the permanent record. A micropuncture set was inserted and exchanged for a J wire which was positioned in the inferior vena cava. Serial dilation was performed followed by  placement of a 12.5 French, 24 cm Trialysis catheter. The catheter tip was positioned in the right atrium. Each lumen flushed and aspirated appropriately. The dialysis ports were locked with appropriate volume of heparin dwell. The middle central venous port was then used for sedation for the remainder of the procedure. The catheter was secured with a 0 silk retention suture. A sterile bandage was applied.   Preprocedure ultrasound evaluation of the right groin was performed which demonstrated a patent right common femoral artery. The procedure was planned. Subdermal Local anesthesia was provided with 1% lidocaine. A small skin nick was made. Under direct ultrasound visualization, a 21 gauge micropuncture needle was directed into the common femoral  artery. An ultrasound image was captured and stored in the permanent record. A micro puncture set was inserted and a limited right lower extremity angiogram was performed which demonstrated appropriate puncture site for closure device use. A J wire was directed to the abdominal aorta and the micropuncture set was exchanged for a 5 [place] vascular sheath. A 5 French C2 catheter was then directed into the right renal ostium. Right renal angiogram was performed. The single main renal artery was patent. 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   A straight lantern microcatheter and 0.014" soft synchro wire was then inserted and directed into the inferior polar branch. Repeat angiogram was performed in the sub selective location which was significant for multifocal 2-4 mm pseudoaneurysm formation, abnormal truncation and irregularity of the arcuate and interlobular branches, an early arteriovenous shunting. The inferior polar arcuate branch was selected further. Coil  embolization was performed with multiple low profile Penumbra Ruby coils ranging from 2-3 mm in diameter. Completion right inferior renal angiogram was performed which demonstrated appropriate embolization of the targeted vessels without persistent arteriovenous fistula or vessel irregularity.   The catheters were removed. The right common femoral artery was then closed with a 6 [place] Angio-Seal device. Distal pulses were unchanged. The patient tolerated the procedure well was transferred back to the floor in good condition.   IMPRESSION: 1. 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 2. Sub selective coil embolization of right inferior polar arcuate artery branch. 3. Successful placement of right internal jugular, 24 French Trialysis catheter with the catheter tip in the right atrium. The catheter is ready for immediate use.   Ruthann Cancer, MD   [facility]   [facility]     Electronically Signed   By: Ruthann Cancer M.D.   On: 08/31/2021 09:29      LOS: 10 days   Phillips Climes, MD  [facility]    To contact the attending provider between 7A-7P or the covering provider during after hours 7P-7A, please log into the web site [email or web address] and access using universal [place] password for that web site. If you do not have the password, please call the hospital operator.  09/02/2021, 1:16 PM

## 2021-09-02 NOTE — Progress Notes (Signed)
[facility] NEPHROLOGY PROGRESS NOTE  Assessment/ Plan:  #Acute kidney injury, nonoliguric, with history of excessive NSAID's use/BC powders every day.  Peaked creatinine level of 6.95.  CT scan without obstruction or hydronephrosis.  UA with more than 300 protein and microscopic hematuria.  Serology evaluation including ANA, ANCA, C3, C4, SPEP, anti-GBM, double-stranded DNA, hep B, hep C, HIV negative.   He underwent IR guided kidney biopsy on 9/6 complicated by postbiopsy bleeding. Awaiting bx result, hopefully it will be back early next week.  He is nonoliguric and creatinine level stable around 5.8 today.  No uremic feature.  He had right IJ temporary HD catheter placed on 9/8 in IR during embolization of the bleeding artery.  No need for dialysis today and continue to watch for renal recovery.  We will check daily lab, strict ins and out.  On gentle IV hydration.  #Post kidney biopsy bleeding, syncope/acute blood loss anemia: He initially received continuous bladder irrigation.  Because of ongoing bleeding, he underwent embolization of right inferior pole arcuate artery and right IJ temporary HD catheter placement by IR on 9/8.  Still having some hematuria, not on CBI at the moment, urology is following; defer to them.  Monitor CBC.  #Anemia, complicated by acute blood loss post kidney biopsy: Urology and IR is following.  Received multiple units of blood transfusion.  Hemoglobin is stable today.On admission FOBT was negative and treated with IV iron.  #History of nephrolithiasis: Repeat CT scan without any obstruction or hydronephrosis.  Urology is following  #Metabolic acidosis: Continue oral sodium bicarbonate, stable CO2 level.  Subjective: Seen and examined. Received 2 units of blood transfusion yesterday.  He has urine output around 2.8 L.  The urine still has blood in it, may be slightly clearing.  He denies nausea, vomiting, chest pain, shortness of  breath.  Objective Vital signs in last 24 hours: Vitals:   09/01/21 2200 09/02/21 0015 09/02/21 0027 09/02/21 0505  BP: (!) 141/79  (!) 150/87 (!) 159/86  Pulse: 84  68 79  Resp: 14  20 14   Temp: 98 F (36.7 C)  97.9 F (36.6 C) 97.9 F (36.6 C)  TempSrc: Oral Oral Oral Oral  SpO2: 95%  95% 94%  Weight:      Height:       Weight change:   Intake/Output Summary (Last 24 hours) at 09/02/2021 1048 Last data filed at 09/02/2021 0925 Gross per 24 hour  Intake 980.33 ml  Output 2925 ml  Net -1944.67 ml        Labs: Basic Metabolic Panel: Recent Labs  Lab 08/31/21 0230 09/01/21 0212 09/02/21 0127  NA 139 138 137  K 4.1 3.9 3.9  CL 106 104 103  CO2 23 23 24   GLUCOSE 106* 103* 98  BUN 34* 36* 37*  CREATININE 5.93* 5.98* 5.82*  CALCIUM 8.3* 8.2* 8.1*  PHOS 5.8* 4.5 5.2*    Liver Function Tests: Recent Labs  Lab 08/31/21 0230 09/01/21 0212 09/02/21 0127  ALBUMIN 1.7* 2.0* 2.0*    No results for input(s): LIPASE, AMYLASE in the last 168 hours.  No results for input(s): AMMONIA in the last 168 hours. CBC: Recent Labs  Lab 08/28/21 0305 08/28/21 1617 08/29/21 0637 08/30/21 0241 08/30/21 1556 08/31/21 0230 08/31/21 1529 09/01/21 0951 09/02/21 0151  WBC 9.3   < > 12.3* 12.8*  --  11.2* 11.5* 11.3*  --   NEUTROABS 5.8  --   --   --   --   --   --   --   --  HGB 8.4*   < > 7.4* 6.4*   < > 7.1* 7.6* 6.6* 8.4*  HCT 26.8*   < > 24.1* 20.8*   < > 22.5* 24.5* 21.4* 26.3*  MCV 82.2   < > 84.6 85.2  --  84.3 85.1 86.3  --   PLT 263   < > 236 239  --  226 246 246  --    < > = values in this interval not displayed.    Cardiac Enzymes: No results for input(s): CKTOTAL, CKMB, CKMBINDEX, TROPONINI in the last 168 hours. CBG: No results for input(s): GLUCAP in the last 168 hours.  Iron Studies: No results for input(s): IRON, TIBC, TRANSFERRIN, FERRITIN in the last 72 hours. Studies/Results: IR Angiogram Renal Left Selective  Result Date:  08/31/2021 INDICATION: 70 year old male with history of acute kidney injury of uncertain etiology status post ultrasound-guided right renal biopsy on 08/28/2021. Since biopsy, the patient has experienced gross hematuria with associated acute anemia.   EXAM: 1. Ultrasound-guided vascular access of the right internal jugular vein. 2. Temporary hemodialysis catheter placement. 3. Ultrasound-guided vascular access of the right common femoral artery. 4. Selective catheterization and angiography of the right renal artery. 5. Sub selective catheterization angiography of right inferior polar and arcuate artery branches. 6. Coil embolization of right inferior pole arcuate artery branch.   MEDICATIONS: None.   ANESTHESIA/SEDATION: Moderate (conscious) sedation was employed during this procedure. A total of Versed 3 mg and Fentanyl 50 mcg was administered intravenously.   Moderate Sedation Time: 66 minutes. The patient's level of consciousness and vital signs were monitored continuously by radiology nursing throughout the procedure under my direct supervision.   CONTRAST:  18mL OMNIPAQUE IOHEXOL 350 MG/ML SOLN, 22mL OMNIPAQUE IOHEXOL 350 MG/ML SOLN   FLUOROSCOPY TIME:  Fluoroscopy Time: 10.6 minutes, (811 mGy).   COMPLICATIONS: None immediate.   PROCEDURE: Informed consent was obtained from the patient following explanation of the procedure, risks, benefits and alternatives. The patient understands, agrees and consents for the procedure. All questions were addressed. A time out was performed prior to the initiation of the procedure. Maximal barrier sterile technique utilized including caps, mask, sterile gowns, sterile gloves, large sterile drape, hand hygiene, and chlorhexidine prep.   Preprocedure ultrasound evaluation of the right internal jugular vein demonstrated a patent and compressible vein free of internal echoes. Procedure was planned. Subdermal Local anesthesia was provided 1% lidocaine. A small skin nick was made.  Under direct ultrasound visualization, a 21 gauge micropuncture needle was directed into the internal jugular vein. An image was captured and stored in the permanent record. A micropuncture set was inserted and exchanged for a J wire which was positioned in the inferior vena cava. Serial dilation was performed followed by placement of a 12.5 French, 24 cm Trialysis catheter. The catheter tip was positioned in the right atrium. Each lumen flushed and aspirated appropriately. The dialysis ports were locked with appropriate volume of heparin dwell. The middle central venous port was then used for sedation for the remainder of the procedure. The catheter was secured with a 0 silk retention suture. A sterile bandage was applied.   Preprocedure ultrasound evaluation of the right groin was performed which demonstrated a patent right common femoral artery. The procedure was planned. Subdermal Local anesthesia was provided with 1% lidocaine. A small skin nick was made. Under direct ultrasound visualization, a 21 gauge micropuncture needle was directed into the common femoral artery. An ultrasound image was captured and stored in the permanent record.  A micro puncture set was inserted and a limited right lower extremity angiogram was performed which demonstrated appropriate puncture site for closure device use. A J wire was directed to the abdominal aorta and the micropuncture set was exchanged for a 5 [place] vascular sheath. A 5 French C2 catheter was then directed into the right renal ostium. Right renal angiogram was performed. The single main renal artery was patent. 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   A straight lantern microcatheter and 0.014" soft synchro wire was then inserted and directed into the  inferior polar branch. Repeat angiogram was performed in the sub selective location which was significant for multifocal 2-4 mm pseudoaneurysm formation, abnormal truncation and irregularity of the arcuate and interlobular branches, an early arteriovenous shunting. The inferior polar arcuate branch was selected further. Coil embolization was performed with multiple low profile Penumbra Ruby coils ranging from 2-3 mm in diameter. Completion right inferior renal angiogram was performed which demonstrated appropriate embolization of the targeted vessels without persistent arteriovenous fistula or vessel irregularity.   The catheters were removed. The right common femoral artery was then closed with a 6 [place] Angio-Seal device. Distal pulses were unchanged. The patient tolerated the procedure well was transferred back to the floor in good condition.   IMPRESSION: 1. 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 2. Sub selective coil embolization of right inferior polar arcuate artery branch. 3. Successful placement of right internal jugular, 24 French Trialysis catheter with the catheter tip in the right atrium. The catheter is ready for immediate use.   Ruthann Cancer, MD   [facility]   [facility]     Electronically Signed   By: Ruthann Cancer M.D.   On: 08/31/2021 09:29     Medications: Infusions:  sodium chloride     sodium chloride 10 mL/hr at 08/27/21 2147   lactated ringers 75 mL/hr at 09/02/21 1000   sodium chloride irrigation      Scheduled Medications:  Chlorhexidine Gluconate Cloth  6 each Topical Daily   folic acid  2 mg Oral Daily   melatonin  5 mg Oral QHS   nicotine  14 mg Transdermal Q24H   pantoprazole  40 mg Oral BID AC   sodium bicarbonate  1,300 mg Oral TID   tamsulosin  0.4 mg Oral Daily     have reviewed scheduled and prn medications.  Physical Exam: General: Lying on bed comfortable, not in distress Heart: Regular rate rhythm, S1-S2 normal, no rubs Lungs: Clear bilateral, no wheezing or crackle Abdomen: Soft, nontender, nondistended Extremities: No LE edema. Neurology: Alert, awake, following commands Vascular Access: Right IJ temporary HD catheter in place.  Linda Grimmer Prasad Trysta Showman 09/02/2021,10:48 AM  LOS: 10 days

## 2021-09-03 ENCOUNTER — Encounter (HOSPITAL_COMMUNITY): Payer: Self-pay | Admitting: Internal Medicine

## 2021-09-03 ENCOUNTER — Inpatient Hospital Stay (HOSPITAL_COMMUNITY): Payer: Commercial Managed Care - PPO

## 2021-09-03 DIAGNOSIS — N08 Glomerular disorders in diseases classified elsewhere: Secondary | ICD-10-CM | POA: Diagnosis not present

## 2021-09-03 DIAGNOSIS — E854 Organ-limited amyloidosis: Secondary | ICD-10-CM | POA: Diagnosis not present

## 2021-09-03 DIAGNOSIS — N179 Acute kidney failure, unspecified: Secondary | ICD-10-CM | POA: Diagnosis not present

## 2021-09-03 DIAGNOSIS — D649 Anemia, unspecified: Secondary | ICD-10-CM | POA: Diagnosis not present

## 2021-09-03 LAB — CBC
HCT: 27.1 % — ABNORMAL LOW (ref 39.0–52.0)
Hemoglobin: 8.7 g/dL — ABNORMAL LOW (ref 13.0–17.0)
MCH: 27.3 pg (ref 26.0–34.0)
MCHC: 32.1 g/dL (ref 30.0–36.0)
MCV: 85 fL (ref 80.0–100.0)
Platelets: 278 10*3/uL (ref 150–400)
RBC: 3.19 MIL/uL — ABNORMAL LOW (ref 4.22–5.81)
RDW: 17.5 % — ABNORMAL HIGH (ref 11.5–15.5)
WBC: 8.8 10*3/uL (ref 4.0–10.5)
nRBC: 0 % (ref 0.0–0.2)

## 2021-09-03 LAB — RENAL FUNCTION PANEL
Albumin: 1.9 g/dL — ABNORMAL LOW (ref 3.5–5.0)
Anion gap: 11 (ref 5–15)
BUN: 36 mg/dL — ABNORMAL HIGH (ref 8–23)
CO2: 22 mmol/L (ref 22–32)
Calcium: 8.1 mg/dL — ABNORMAL LOW (ref 8.9–10.3)
Chloride: 105 mmol/L (ref 98–111)
Creatinine, Ser: 5.74 mg/dL — ABNORMAL HIGH (ref 0.61–1.24)
GFR, Estimated: 10 mL/min — ABNORMAL LOW (ref >60)
Glucose, Bld: 85 mg/dL (ref 70–99)
Phosphorus: 5.2 mg/dL — ABNORMAL HIGH (ref 2.5–4.6)
Potassium: 3.5 mmol/L (ref 3.5–5.1)
Sodium: 138 mmol/L (ref 135–145)

## 2021-09-03 IMAGING — DX DG CHEST 1V PORT
1 series · 1 of 1 positions shown · non-contrast
Comparison: [DATE]

CLINICAL DATA: Shortness of breath

EXAM:
PORTABLE CHEST 1 VIEW

[chest]
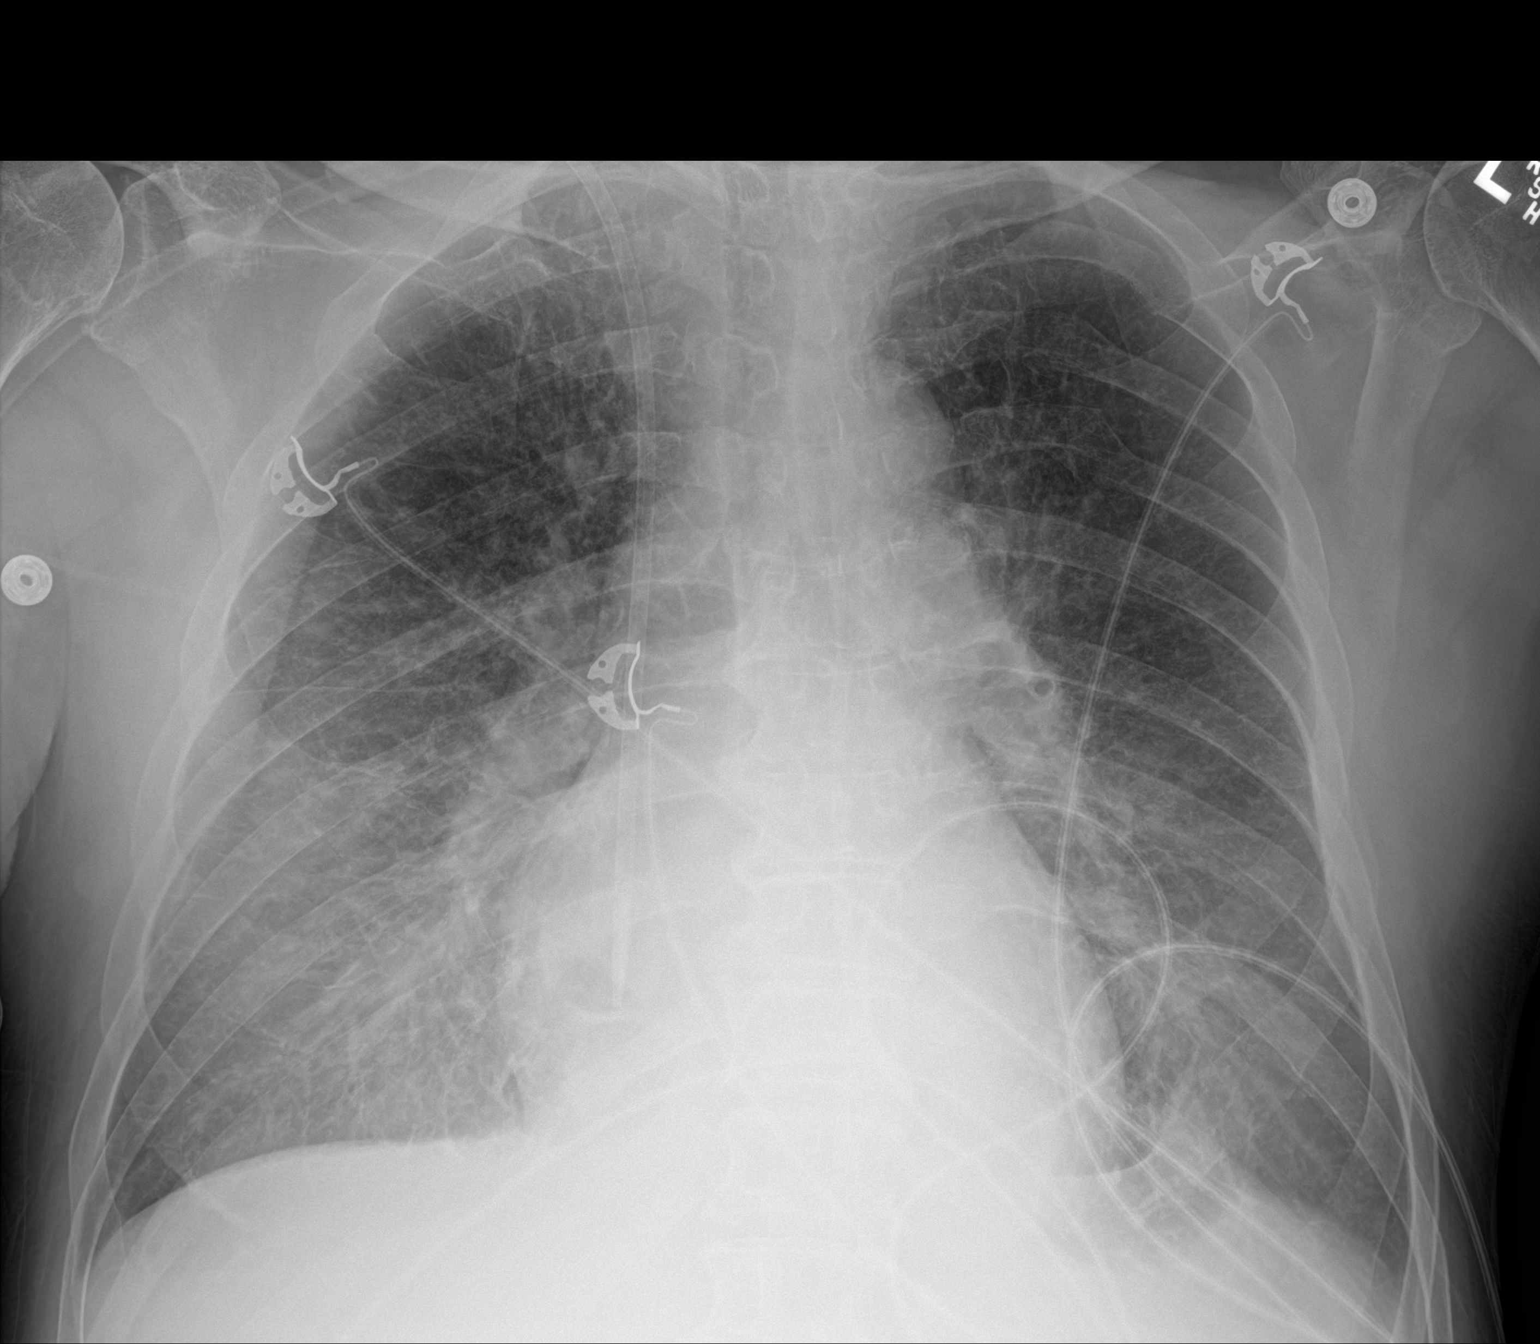

[1 of 1 positions shown; findings below may reference images not displayed]

FINDINGS: Right dialysis catheter in place with the tip in the right atrium.
Heart is normal size. Mild vascular congestion and interstitial
prominence throughout the lungs, likely mild edema. No effusions or
acute bony abnormality.
IMPRESSION: Suspect mild pulmonary edema.

## 2021-09-03 MED ORDER — FUROSEMIDE 10 MG/ML IJ SOLN
40.0000 mg | Freq: Once | INTRAMUSCULAR | Status: AC
  Administered 2021-09-03: 40 mg via INTRAVENOUS
  Filled 2021-09-03: qty 4

## 2021-09-03 MED ORDER — NEPRO/CARBSTEADY PO LIQD
237.0000 mL | Freq: Three times a day (TID) | ORAL | Status: DC
  Administered 2021-09-03 – 2021-09-20 (×5): 237 mL via ORAL

## 2021-09-03 NOTE — Progress Notes (Signed)
Occupational Therapy Treatment Patient Details Name: Caleb Simmons MRN: [id number] DOB: 1951-08-09 Today's Date: 09/03/2021   History of present illness Patient is a 70 y.o. male presenting with generalized weakness/shortness of breath-found to have AKI and severe normocytic anemia. Underwent right renal biopsy on 9/6-unfortunately postrenal biopsy-patient developed bleeding into the renal collecting system-with clot retention requiring insertion of three-way catheter and CBI.  Patient continued to have renal bleeding-on 9/8-IR performed embolization. Past medical history significant of nephrolithiasis, 1/2 PPD smoker   OT comments  Patient progressing and showed improved tolerance to standing ADLs with improved static standing balance, and hallway ambulation to 280' without dizziness  compared to previous session. Patient remains limited by mild unsteadiness with ambulation and will furniture cruise to compensate as well as generalized weakness and decreased activity tolerance along with deficits noted below and recent loss of Mother, wife and brother last year. Pt continues to demonstrate good rehab potential and would benefit from continued skilled OT to increase safety and independence with ADLs and functional transfers to allow pt to return home safely and reduce caregiver burden and fall risk.    Recommendations for follow up therapy are one component of a multi-disciplinary discharge planning process, led by the attending physician.  Recommendations may be updated based on patient status, additional functional criteria and insurance authorization.    Follow Up Recommendations  Home health OT;Supervision - Intermittent    Equipment Recommendations  3 in 1 bedside commode    Recommendations for Other Services      Precautions / Restrictions Precautions Precautions: Fall Precaution Comments: pt reports x1 episode of passing out with OOB moblity this admission while sitting on  commode Restrictions Weight Bearing Restrictions: No       Mobility Bed Mobility Overal bed mobility: Modified Independent             General bed mobility comments: Mod I Supine<>Sit    Transfers Overall transfer level: Needs assistance   Transfers: Sit to/from Stand Sit to Stand: Supervision         General transfer comment: supervision for safety only. Cues for slow controlled movements. Per pt request, pt performed hallway ambulation without AD, intermittently touching hallway rails but not LOB noted. Pt completed 280' with supevision. Denied any dizziness/lighheadedness throughout.    Balance Overall balance assessment: Needs assistance Sitting-balance support: Feet supported Sitting balance-Leahy Scale: Good       Standing balance-Leahy Scale: Fair Standing balance comment: Good static/fair dynamic with need of ocassional "furniture cruising" in room.                           ADL either performed or assessed with clinical judgement   ADL Overall ADL's : Needs assistance/impaired     Grooming: Supervision/safety;Oral care;Standing;Wash/dry face;Brushing hair Grooming Details (indicate cue type and reason): Stood at sink for oral and face hygiene and finger combed hair with supervision.                               General ADL Comments: supervision for mobility due to pt report of h/o dizziness "seeing spots". Pt declined any other ADLs and requested hallway ambulation. Please see mobility section.     Vision   Vision Assessment?: No apparent visual deficits   Perception     Praxis      Cognition Arousal/Alertness: Awake/alert Behavior During Therapy: WFL for tasks assessed/performed Overall Cognitive  Status: Within Functional Limits for tasks assessed                                 General Comments: Pt reported that his wife, brother and mom all passed last year.        Exercises Other Exercises Other  Exercises: Pt reports that he performs ankle pumps "all the time".   Shoulder Instructions       General Comments      Pertinent Vitals/ Pain       Pain Assessment: 0-10 Pain Score: 4  Pain Location: R shoulder from HD cath placement. Pain Descriptors / Indicators: Discomfort Pain Intervention(s): Limited activity within patient's tolerance;Monitored during session  Home Living                                          Prior Functioning/Environment              Frequency  Min 2X/week        Progress Toward Goals  OT Goals(current goals can now be found in the care plan section)  Progress towards OT goals: Progressing toward goals  Acute Rehab OT Goals Patient Stated Goal: return home and feel better OT Goal Formulation: With patient Potential to Achieve Goals: Good ADL Goals Pt Will Perform Grooming: standing;with mod assist Pt Will Perform Lower Body Bathing: sit to/from stand;sitting/lateral leans;with modified independence Pt Will Perform Lower Body Dressing: with modified independence;sit to/from stand Pt Will Transfer to Toilet: with modified independence;regular height toilet;ambulating Pt Will Perform Tub/Shower Transfer: Tub transfer;ambulating;with modified independence Additional ADL Goal #1: Pt will engage in 15 min standing functional activities without loss of balance and without dizziness/lightheadedness, in order to demonstrate improved activity tolerance and balance needed to perform ADLs safely at home.  Plan Discharge plan remains appropriate    Co-evaluation                 AM-PAC OT "6 Clicks" Daily Activity     Outcome Measure   Help from another person eating meals?: None Help from another person taking care of personal grooming?: None Help from another person toileting, which includes using toliet, bedpan, or urinal?: A Little Help from another person bathing (including washing, rinsing, drying)?: A Little Help  from another person to put on and taking off regular upper body clothing?: None Help from another person to put on and taking off regular lower body clothing?: A Little 6 Click Score: 21    End of Session Equipment Utilized During Treatment: Gait belt  OT Visit Diagnosis: Other abnormalities of gait and mobility (R26.89);Pain;Muscle weakness (generalized) (M62.81)   Activity Tolerance Patient tolerated treatment well   Patient Left in bed;with call bell/phone within reach;with SCD's reapplied   Nurse Communication Other (comment) (Unable to locate RN. Called RN number and phone rang next to OT. No one present. Left note for RN that unable to successfully restart SCDs.)        Time: 9381-8299 OT Time Calculation (min): 26 min  Charges: OT General Charges $OT Visit: 1 Visit OT Treatments $Self Care/Home Management : 8-22 mins $Therapeutic Activity: 8-22 mins  Anderson Malta, OT Acute Rehab Services Office: [phone] 09/03/2021  Julien Girt 09/03/2021, 2:20 PM

## 2021-09-03 NOTE — Progress Notes (Signed)
[facility] NEPHROLOGY PROGRESS NOTE  Assessment/ Plan:  #Acute kidney injury, nonoliguric, with history of excessive NSAID's use/BC powders every day.  Peaked creatinine level of 6.95.  CT scan without obstruction or hydronephrosis.  UA with more than 300 protein and microscopic hematuria.  Serology evaluation including ANA, ANCA, C3, C4, SPEP, anti-GBM, double-stranded DNA, hep B, hep C, HIV negative.   -He underwent IR guided kidney biopsy on 9/6 complicated by postbiopsy bleeding. Awaiting bx result, hopefully it will be back this week  -He is nonoliguric and creatinine level stable around 5.7 today.  He had right IJ temporary HD catheter placed on 9/8 in IR during embolization of the bleeding artery.  No indication for renal replacement therapy yet however I am concerned about his lack of appetite and possible dysgeusia, family will try to bring him outside food to see if this will help.   -Check daily lab, strict ins and out.  On gentle IV hydration.  #Post kidney biopsy bleeding, syncope/acute blood loss anemia: He initially received continuous bladder irrigation.  Because of ongoing bleeding, he underwent embolization of right inferior pole arcuate artery and right IJ temporary HD catheter placement by IR on 9/8.  Still having some hematuria, not on CBI at the moment, urology is following; defer to them.  Monitor CBC.  #Anemia, complicated by acute blood loss post kidney biopsy: Urology and IR is following.  Received multiple units of blood transfusion.  Hemoglobin is stable today.On admission FOBT was negative and treated with IV iron.  #History of nephrolithiasis: Repeat CT scan without any obstruction or hydronephrosis.  Urology is following  #Metabolic acidosis: Continue oral sodium bicarbonate, stable CO2 level.  Subjective: Seen and examined bedside. Daughter in law at bedside today. Still having issues with appetite. Sight and smell of food makes him nauseated (which  has been an issue since he's been here). No other complaints. Uop ~2.9L  Objective Vital signs in last 24 hours: Vitals:   09/02/21 0505 09/02/21 1345 09/02/21 2002 09/03/21 0420  BP: (!) 159/86 (!) 165/90 (!) 150/90 (!) 141/88  Pulse: 79  82 80  Resp: 14 (!) 22 14 19   Temp: 97.9 F (36.6 C) 97.9 F (36.6 C) 98 F (36.7 C) 97.8 F (36.6 C)  TempSrc: Oral Oral Oral Oral  SpO2: 94% 90% 95% 91%  Weight:      Height:       Weight change:   Intake/Output Summary (Last 24 hours) at 09/03/2021 1104 Last data filed at 09/03/2021 0936 Gross per 24 hour  Intake 700 ml  Output 3000 ml  Net -2300 ml       Labs: Basic Metabolic Panel: Recent Labs  Lab 09/01/21 0212 09/02/21 0127 09/03/21 0214  NA 138 137 138  K 3.9 3.9 3.5  CL 104 103 105  CO2 23 24 22   GLUCOSE 103* 98 85  BUN 36* 37* 36*  CREATININE 5.98* 5.82* 5.74*  CALCIUM 8.2* 8.1* 8.1*  PHOS 4.5 5.2* 5.2*   Liver Function Tests: Recent Labs  Lab 09/01/21 0212 09/02/21 0127 09/03/21 0214  ALBUMIN 2.0* 2.0* 1.9*   No results for input(s): LIPASE, AMYLASE in the last 168 hours.  No results for input(s): AMMONIA in the last 168 hours. CBC: Recent Labs  Lab 08/28/21 0305 08/28/21 1617 08/30/21 0241 08/30/21 1556 08/31/21 0230 08/31/21 1529 09/01/21 0951 09/02/21 0151 09/03/21 0214  WBC 9.3   < > 12.8*  --  11.2* 11.5* 11.3*  --  8.8  NEUTROABS  5.8  --   --   --   --   --   --   --   --   HGB 8.4*   < > 6.4*   < > 7.1* 7.6* 6.6* 8.4* 8.7*  HCT 26.8*   < > 20.8*   < > 22.5* 24.5* 21.4* 26.3* 27.1*  MCV 82.2   < > 85.2  --  84.3 85.1 86.3  --  85.0  PLT 263   < > 239  --  226 246 246  --  278   < > = values in this interval not displayed.   Cardiac Enzymes: No results for input(s): CKTOTAL, CKMB, CKMBINDEX, TROPONINI in the last 168 hours. CBG: No results for input(s): GLUCAP in the last 168 hours.  Iron Studies: No results for input(s): IRON, TIBC, TRANSFERRIN, FERRITIN in the last 72  hours. Studies/Results: DG Chest Port 1 View  Result Date: 09/03/2021 CLINICAL DATA:  Shortness of breath EXAM: PORTABLE CHEST 1 VIEW COMPARISON:  08/23/2021 FINDINGS: Right dialysis catheter in place with the tip in the right atrium. Heart is normal size. Mild vascular congestion and interstitial prominence throughout the lungs, likely mild edema. No effusions or acute bony abnormality. IMPRESSION: Suspect mild pulmonary edema. Electronically Signed   By: Rolm Baptise M.D.   On: 09/03/2021 09:40    Medications: Infusions:  sodium chloride     sodium chloride 10 mL/hr at 08/27/21 2147   lactated ringers 75 mL/hr at 09/03/21 0425   sodium chloride irrigation      Scheduled Medications:  Chlorhexidine Gluconate Cloth  6 each Topical Daily   feeding supplement (NEPRO CARB STEADY)  237 mL Oral TID BM   folic acid  2 mg Oral Daily   melatonin  5 mg Oral QHS   nicotine  14 mg Transdermal Q24H   pantoprazole  40 mg Oral BID AC   sodium bicarbonate  1,300 mg Oral TID   tamsulosin  0.4 mg Oral Daily    have reviewed scheduled and prn medications.  Physical Exam: General: Lying on bed comfortable, not in distress Heart: Regular rate rhythm, S1-S2 normal, no rubs Lungs: Clear bilateral, no wheezing or crackle Abdomen: Soft, nontender, nondistended Extremities: No LE edema. Neurology: Alert, awake, following commands, no asterixis Vascular Access: Right IJ temporary HD catheter in place.  Duel Conrad 09/03/2021,11:04 AM  LOS: 11 days

## 2021-09-03 NOTE — Progress Notes (Signed)
PROGRESS NOTE        PATIENT DETAILS Name: Caleb Simmons Age: 70 y.o. Sex: male Date of Birth: Apr 20, 1951 Admit Date: 08/23/2021 Admitting Physician Costin Karlyne Greenspan, MD PCP:Pcp, No  Brief Narrative:  Patient is a 70 y.o. male with history of nephrolithiasis-presenting with generalized weakness/shortness of breath-found to have AKI and severe normocytic anemia.  Patient was evaluated by nephrology-with concerns that patient's AKI was related to a glomerular pathology-subsequently underwent right renal biopsy on 9/6-unfortunately postrenal biopsy-patient developed bleeding into the renal collecting system-with clot retention requiring insertion of three-way catheter and CBI.  Patient continued to have renal bleeding-on 9/8-IR performed embolization.    See below for further details.  Significant events: 9/1>> admit for evaluation of AKI/severe anemia. 9/6>> ultrasound-guided biopsy of right kidney 9/6>> developed hematuria followed by acute urinary retention-presyncope and hypotension post kidney biopsy.  Three-way Foley catheter placed and CBI started. 9/8>> hematuria-hemoglobin down to 6.4-IR performed embolization and TDC placement.  Significant studies: 9/1>> CXR: No pneumonia 9/1>> VQ scan: No PE 9/1>> CT renal stone study: No hydronephrosis, distended bladder.  Diverticulosis. 9/1>> FOBT: Negative 9/1>> UA: Protein>> 300, RBCs 11-20/hpf 9/1>> HIV: Nonreactive 9/1>> HBsAg/HCV Ab: Nonreactive 9/1>> C3/C4: Normal limits 9/1>> glomerular basement Ab: Negative 9/1>> ANA: Negative 9/1>> dsDNA: Negative 9/1>> ANCA titers: Negative 9/1>> SPEP: no M  spike 9/2>> Echo: EF 71-69%, grade 1 diastolic dysfunction. 9/4>>Folic acid :6.7(ELF) 8/1>>OFB B12:298 9/6>> CT abdomen/pelvis: Tiny perinephric hematoma, bleeding into the right renal collecting system 9/8>> CT abdomen/pelvis: Substantial clot burden within the bladder-small perinephric  hematoma   Antimicrobial therapy: None  Microbiology data: 9/1>> COVID PCR: Negative  Procedures : 9/06>> ultrasound-guided biopsy of right kidney 9/8>> 1) Right IJ temporary hemodialysis catheter placement 2) Right renal angiogram 3) Selective catheterization and embolization of right inferior pole arcuate artery  Consults: Nephrology, IR, urology  DVT Prophylaxis : Place and maintain sequential compression device Start: 09/01/21 1335 Place and maintain sequential compression device Start: 08/24/21 0519   Subjective:  Reports some dyspnea, he had some anxiety yesterday as well, ports some nausea and poor appetite.    Assessment/Plan:  Acute kidney injury:  Suspicion for glomerular pathology-extensive serological work-up negative-awaiting results of renal biopsy.  Creatinine had stabilized to around 5-however due to bleeding post biopsy/blood loss anemia-creatinine is slowly uptrending-however no indications for RRT yet.  Nephrology following-dialysis catheter placed by IR yesterday in case patient requires HD (concern for further worsening of AKI-contrast nephropathy with embolization procedure). -Management per nephrology -Patient on IV fluids, but he does report some dyspnea, some new oxygen requirement, chest x-ray significant for mild edema, will DC IV fluid and give 1 dose Lasix as discussed with renal.  He was encouraged to use incentive spirometer -Reports some nausea, poor appetite, will be started on Nepro.  Intake/Output Summary (Last 24 hours) at 09/03/2021 1401 Last data filed at 09/03/2021 1206 Gross per 24 hour  Intake 700 ml  Output 3325 ml  Net -2625 ml    Hypotension/presyncope-hematuria-acute urinary retention (clot retention) due to bleeding into the collecting system of the right kidney post renal biopsy on 9/6-requiring three-way Foley catheter insertion and CBI and embolization by IR on 9/8 :  -Management per urology, Foley catheter discontinued 9/8,  he does remain with some hematuria, but still passing urine, will continue to monitor for signs of retention closely , continue to  monitor with bladder scan . -Trend CBC closely and transfuse as needed.      Microcytic anemia/acute blood loss anemia due to hematuria -Multifactorial anemia-from AKI/acute illness/vitamin B12 and folate deficiency -He did require multiple transfusions  Minimally elevated troponins: Trend is flat-not consistent with ACS-he does not have any chest pain.  Exertional dyspnea was from symptomatic anemia.  Suspect elevated troponin is in the setting of kidney failure.  Echo without any wall motion malady and preserved EF.  Doubt any further work-up is required-especially in light of severity of anemia and AKI.  Anxiety: Continue Xanax while he is in the hospital-is aware that we will likely not continue Xanax post discharge.  Use trazodone for sleep.  Patient is not interested in starting long-acting medications like SSRI for his anxiety-thinks that once he gets home-he will not require any further medications for his anxiety issues.  Tobacco abuse: Continue transdermal nicotine. Diet: Diet Order             Diet regular Room service appropriate? Yes; Fluid consistency: Thin  Diet effective now                    Code Status: Full code   Family Communication: Cussed with daughter-in-law at bedside  DVT prophylaxis: ED, he was encouraged to ambulate as much as possible, no chemical prophylaxis given significant hematuria.  Disposition Plan: Status is: Inpatient  Remains inpatient appropriate because:Inpatient level of care appropriate due to severity of illness  Dispo: The patient is from: Home              Anticipated d/c is to: Home              Patient currently is not medically stable to d/c.   Difficult to place patient No       Antimicrobial agents: Anti-infectives (From admission, onward)    None        MEDICATIONS: Scheduled  Meds:  Chlorhexidine Gluconate Cloth  6 each Topical Daily   feeding supplement (NEPRO CARB STEADY)  237 mL Oral TID BM   folic acid  2 mg Oral Daily   melatonin  5 mg Oral QHS   nicotine  14 mg Transdermal Q24H   pantoprazole  40 mg Oral BID AC   sodium bicarbonate  1,300 mg Oral TID   tamsulosin  0.4 mg Oral Daily   Continuous Infusions:  sodium chloride     sodium chloride 10 mL/hr at 08/27/21 2147   lactated ringers 75 mL/hr at 09/03/21 0425   sodium chloride irrigation     PRN Meds:.acetaminophen, ALPRAZolam, bisacodyl, diphenhydrAMINE, heparin sodium (porcine), HYDROmorphone (DILAUDID) injection, lidocaine, midazolam, midazolam, ondansetron **OR** ondansetron (ZOFRAN) IV, promethazine, technetium albumin aggregated, traMADol, traZODone   PHYSICAL EXAM: Vital signs: Vitals:   09/02/21 0505 09/02/21 1345 09/02/21 2002 09/03/21 0420  BP: (!) 159/86 (!) 165/90 (!) 150/90 (!) 141/88  Pulse: 79  82 80  Resp: 14 (!) 22 14 19   Temp: 97.9 F (36.6 C) 97.9 F (36.6 C) 98 F (36.7 C) 97.8 F (36.6 C)  TempSrc: Oral Oral Oral Oral  SpO2: 94% 90% 95% 91%  Weight:      Height:       Filed Weights   08/23/21 0816  Weight: 81.6 kg   Body mass index is 25.83 kg/m.   Awake Alert, Oriented X 3, No new F.N deficits, Normal affect Symmetrical Chest wall movement, diminished entry at the bases RRR,No Gallops,Rubs or new Murmurs, No  Parasternal Heave +ve B.Sounds, Abd Soft, No tenderness, No rebound - guarding or rigidity. No Cyanosis, Clubbing or edema, No new Rash or bruise       I have personally reviewed following labs and imaging studies  LABORATORY DATA: CBC: Recent Labs  Lab 08/28/21 0305 08/28/21 1617 08/30/21 0241 08/30/21 1556 08/31/21 0230 08/31/21 1529 09/01/21 0951 09/02/21 0151 09/03/21 0214  WBC 9.3   < > 12.8*  --  11.2* 11.5* 11.3*  --  8.8  NEUTROABS 5.8  --   --   --   --   --   --   --   --   HGB 8.4*   < > 6.4*   < > 7.1* 7.6* 6.6* 8.4* 8.7*   HCT 26.8*   < > 20.8*   < > 22.5* 24.5* 21.4* 26.3* 27.1*  MCV 82.2   < > 85.2  --  84.3 85.1 86.3  --  85.0  PLT 263   < > 239  --  226 246 246  --  278   < > = values in this interval not displayed.    Basic Metabolic Panel: Recent Labs  Lab 08/30/21 0241 08/31/21 0230 09/01/21 0212 09/02/21 0127 09/03/21 0214  NA 136 139 138 137 138  K 3.8 4.1 3.9 3.9 3.5  CL 104 106 104 103 105  CO2 23 23 23 24 22   GLUCOSE 86 106* 103* 98 85  BUN 35* 34* 36* 37* 36*  CREATININE 5.56* 5.93* 5.98* 5.82* 5.74*  CALCIUM 8.3* 8.3* 8.2* 8.1* 8.1*  PHOS 5.2* 5.8* 4.5 5.2* 5.2*    GFR: Estimated Creatinine Clearance: 12.2 mL/min (A) (by C-G formula based on SCr of 5.74 mg/dL (H)).  Liver Function Tests: Recent Labs  Lab 08/30/21 0241 08/31/21 0230 09/01/21 0212 09/02/21 0127 09/03/21 0214  ALBUMIN 1.6* 1.7* 2.0* 2.0* 1.9*   No results for input(s): LIPASE, AMYLASE in the last 168 hours.  No results for input(s): AMMONIA in the last 168 hours.  Coagulation Profile: Recent Labs  Lab 08/28/21 0305 08/29/21 0637 08/30/21 0241  INR 1.5* 1.7* 1.5*    Cardiac Enzymes: No results for input(s): CKTOTAL, CKMB, CKMBINDEX, TROPONINI in the last 168 hours.  BNP (last 3 results) No results for input(s): PROBNP in the last 8760 hours.  Lipid Profile: No results for input(s): CHOL, HDL, LDLCALC, TRIG, CHOLHDL, LDLDIRECT in the last 72 hours.  Thyroid Function Tests: No results for input(s): TSH, T4TOTAL, FREET4, T3FREE, THYROIDAB in the last 72 hours.  Anemia Panel: No results for input(s): VITAMINB12, FOLATE, FERRITIN, TIBC, IRON, RETICCTPCT in the last 72 hours.   Urine analysis:    Component Value Date/Time   COLORURINE STRAW (A) 08/23/2021 1140   APPEARANCEUR CLEAR 08/23/2021 1140   LABSPEC 1.008 08/23/2021 1140   PHURINE 6.0 08/23/2021 1140   GLUCOSEU 50 (A) 08/23/2021 1140   HGBUR SMALL (A) 08/23/2021 1140   BILIRUBINUR NEGATIVE 08/23/2021 1140   KETONESUR NEGATIVE  08/23/2021 1140   PROTEINUR >=300 (A) 08/23/2021 1140   UROBILINOGEN 0.2 06/26/2007 1023   NITRITE NEGATIVE 08/23/2021 1140   LEUKOCYTESUR NEGATIVE 08/23/2021 1140    Sepsis Labs: Lactic Acid, Venous No results found for: LATICACIDVEN  MICROBIOLOGY: No results found for this or any previous visit (from the past 240 hour(s)).   RADIOLOGY STUDIES/RESULTS: DG Chest Port 1 View  Result Date: 09/03/2021 CLINICAL DATA:  Shortness of breath EXAM: PORTABLE CHEST 1 VIEW COMPARISON:  08/23/2021 FINDINGS: Right dialysis catheter in place with the  tip in the right atrium. Heart is normal size. Mild vascular congestion and interstitial prominence throughout the lungs, likely mild edema. No effusions or acute bony abnormality. IMPRESSION: Suspect mild pulmonary edema. Electronically Signed   By: Rolm Baptise M.D.   On: 09/03/2021 09:40     LOS: 11 days   Phillips Climes, MD  [facility]    To contact the attending provider between 7A-7P or the covering provider during after hours 7P-7A, please log into the web site [email or web address] and access using universal [place] password for that web site. If you do not have the password, please call the hospital operator.  09/03/2021, 2:01 PM

## 2021-09-03 NOTE — Consult Note (Addendum)
[place]  Telephone:([phone] Fax:([phone]) [phone]   MEDICAL ONCOLOGY - INITIAL CONSULTATION  Referral MD: Dr. 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  He presented to the emergency department with generalized weakness and shortness of breath.  He had a 1 week history of lightheadedness with dyspnea with exertion.  In the emergency department, labs significant for a BUN of 44 and creatinine 6.5.  He was also found to have a hemoglobin of 6.0 and WBC of 14.2.  Stool for occult blood was negative.  He has received a total of 6 units PRBC so far this admission.  Serology evaluation including ANA, ANCA, C3, C4, SPEP, anti-GBM, double-stranded DNA, hep B, hep C, HIV negative. The patient was evaluated by nephrology and underwent right renal biopsy on 08/28/2021.  Post biopsy, he developed bleeding and underwent IR embolization on 08/30/2021.  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  I met with the patient and his daughter-in-law who was at the bedside.  The patient reports that he had been feeling weak for about 1 week prior to admission.  He thought he was having his usual yearly bronchitis and had been seen by urgent care and was placed on antibiotics and steroids.  He reported having presyncopal symptoms.  He also reported having vision loss in his left eye.  Today, he reports that he is feeling better.  He is no longer having presyncopal episodes.  Has been ambulating in his room.  He currently denies fevers and chills.  He denies chest pain or shortness of breath.  Denies abdominal pain, nausea, vomiting.  No bleeding reported.  The patient is widowed.  He is currently living alone.  He has 1 son who lives in [place], [place].  He denies alcohol use.  He was smoking about half pack of cigarettes per day prior to admission.  He  has been working full-time prior to admission.  Family history significant for a brother with pancreatic cancer and a son with melanoma.  Medical oncology was asked to see the patient for recommendations regarding his amyloidosis.  History reviewed. No pertinent past medical history.:   Past Surgical History:  Procedure Laterality Date   APPENDECTOMY     IR EMBO ART  VEN HEMORR LYMPH EXTRAV  INC GUIDE ROADMAPPING  08/30/2021   IR FLUORO GUIDE CV LINE RIGHT  08/30/2021   IR RENAL SELECTIVE  UNI INC S&I MOD SED  08/31/2021   IR US GUIDE VASC ACCESS RIGHT  08/30/2021   IR US GUIDE VASC ACCESS RIGHT  08/30/2021  :   Current Facility-Administered Medications  Medication Dose Route Frequency Provider Last Rate Last Admin   0.9 %  sodium chloride infusion  10 mL/hr Intravenous Once Carmin Muskrat, MD       0.9 %  sodium chloride infusion   Intravenous Continuous Jonetta Osgood, MD 10 mL/hr at 08/27/21 2147 New Bag at 08/27/21 2147   acetaminophen (TYLENOL) tablet 650 mg  650 mg Oral Q6H PRN Jonetta Osgood, MD   650 mg at 09/02/21 2105   ALPRAZolam (XANAX) tablet 0.25 mg  0.25 mg Oral TID PRN Jonetta Osgood, MD   0.25 mg at 09/03/21 1423   bisacodyl (DULCOLAX) EC tablet 5 mg  5 mg Oral Daily PRN Caren Griffins, MD   5 mg at 08/25/21 0926   Chlorhexidine Gluconate  Cloth 2 % PADS 6 each  6 each Topical Daily Ghimire, Henreitta Leber, MD   6 each at 09/02/21 1000   diphenhydrAMINE (BENADRYL) injection    PRN Suzette Battiest, MD   50 mg at 08/30/21 1716   feeding supplement (NEPRO CARB STEADY) liquid 237 mL  237 mL Oral TID BM Elgergawy, Silver Huguenin, MD   237 mL at 90/30/09 2330   folic acid (FOLVITE) tablet 2 mg  2 mg Oral Daily Jonetta Osgood, MD   2 mg at 09/03/21 1041   heparin sodium (porcine) injection    PRN Suzette Battiest, MD   3,000 Units at 08/30/21 1750   HYDROmorphone (DILAUDID) injection 0.5 mg  0.5 mg Intravenous Q4H PRN Jonetta Osgood, MD   0.5 mg at 09/02/21 1005   lidocaine  (XYLOCAINE) 1 % (with pres) injection    PRN Suttle, Rosanne Ashing, MD   15 mL at 08/30/21 1710   melatonin tablet 5 mg  5 mg Oral QHS Jonetta Osgood, MD   5 mg at 09/02/21 2105   midazolam (VERSED) injection    PRN Suzette Battiest, MD   2 mg at 08/30/21 1715   midazolam (VERSED) injection    PRN Suzette Battiest, MD   1 mg at 08/30/21 1741   nicotine (NICODERM CQ - dosed in mg/24 hours) patch 14 mg  14 mg Transdermal Q24H Jonetta Osgood, MD   14 mg at 09/03/21 1040   ondansetron (ZOFRAN) tablet 4 mg  4 mg Oral Q6H PRN Caren Griffins, MD       Or   ondansetron (ZOFRAN) injection 4 mg  4 mg Intravenous Q6H PRN Caren Griffins, MD   4 mg at 09/02/21 1556   pantoprazole (PROTONIX) EC tablet 40 mg  40 mg Oral BID AC Caren Griffins, MD   40 mg at 09/03/21 1041   promethazine (PHENERGAN) 6.25 MG/5ML syrup 6.25 mg  6.25 mg Oral Q4H PRN Caren Griffins, MD       sodium bicarbonate tablet 1,300 mg  1,300 mg Oral TID Gean Quint, MD   1,300 mg at 09/03/21 1041   sodium chloride irrigation 0.9 % 3,000 mL  3,000 mL Irrigation Continuous Ghimire, Shanker M, MD   3,000 mL at 08/29/21 1640   tamsulosin (FLOMAX) capsule 0.4 mg  0.4 mg Oral Daily Jonetta Osgood, MD   0.4 mg at 09/03/21 1042   technetium albumin aggregated (MAA) injection solution 4.2 millicurie  4.2 millicurie Intravenous Once PRN Felipa Emory, MD       traMADol Veatrice Bourbon) tablet 50 mg  50 mg Oral Q8H PRN Chotiner, Yevonne Aline, MD   50 mg at 09/03/21 0601   traZODone (DESYREL) tablet 50 mg  50 mg Oral QHS PRN Jonetta Osgood, MD   50 mg at 08/30/21 2130     No Known Allergies:  History reviewed. No pertinent family history.:   Social History   Socioeconomic History   Marital status: Married    Spouse name: Not on file   Number of children: Not on file   Years of education: Not on file   Highest education level: Not on file  Occupational History   Not on file  Tobacco Use   Smoking status: Some Days    Packs/day:  0.25    Years: 25.00    Pack years: 6.25    Types: Cigarettes   Smokeless tobacco: Never  Substance and Sexual Activity  Alcohol use: Not on file   Drug use: Not on file   Sexual activity: Not on file  Other Topics Concern   Not on file  Social History Narrative   Not on file   Social Determinants of Health   Financial Resource Strain: Not on file  Food Insecurity: Not on file  Transportation Needs: Not on file  Physical Activity: Not on file  Stress: Not on file  Social Connections: Not on file  Intimate Partner Violence: Not on file  :  Review of Systems: A comprehensive 14 point review of systems was negative except as noted in the HPI.  Exam: Patient Vitals for the past 24 hrs:  BP Temp Temp src Pulse Resp SpO2  09/03/21 0420 (!) 141/88 97.8 F (36.6 C) Oral 80 19 91 %  09/02/21 2002 (!) 150/90 98 F (36.7 C) Oral 82 14 95 %    General:  well-nourished in no acute distress.   Eyes:  no scleral icterus.   ENT:  There were no oropharyngeal lesions.     Lymphatics:  Negative cervical, supraclavicular or axillary adenopathy.   Respiratory: Diminished at the bases. Cardiovascular:  Regular rate and rhythm, S1/S2, without murmur, rub or gallop.  There was no pedal edema.   GI:  abdomen was soft, flat, nontender, nondistended, without organomegaly.   Musculoskeletal: Strength symmetrical in the upper and lower extremities. Skin exam was without echymosis, petichae.   Neuro exam was nonfocal. Patient was alert and oriented.  Attention was good.   Language was appropriate.  Mood was normal without depression.  Speech was not pressured.  Thought content was not tangential.     Lab Results  Component Value Date   WBC 8.8 09/03/2021   HGB 8.7 (L) 09/03/2021   HCT 27.1 (L) 09/03/2021   PLT 278 09/03/2021   GLUCOSE 85 09/03/2021   ALT 9 08/26/2021   AST 14 (L) 08/26/2021   NA 138 09/03/2021   K 3.5 09/03/2021   CL 105 09/03/2021   CREATININE 5.74 (H) 09/03/2021    BUN 36 (H) 09/03/2021   CO2 22 09/03/2021    CT ABDOMEN PELVIS WO CONTRAST  Result Date: 08/30/2021 CLINICAL DATA:  Retroperitoneal hematoma, follow up s/p random renal bx with perinephric hematoma development EXAM: CT ABDOMEN AND PELVIS WITHOUT CONTRAST TECHNIQUE: Multidetector CT imaging of the abdomen and pelvis was performed following the standard protocol without IV contrast. COMPARISON:  September 6 FINDINGS: Inferior chest: Trace bilateral pleural effusions with right greater than left compressive subsegmental atelectasis. Hepatobiliary: The liver is normal in size without focal abnormality. No intrahepatic or extrahepatic biliary ductal dilation. The gallbladder appears normal. Spleen: Normal in size without focal abnormality. Pancreas: No pancreatic ductal dilatation or surrounding inflammatory changes. Adrenals/Urinary Tract: Adrenal glands are unremarkable. The kidneys are normal in size. Tiny perinephric hematoma along the right renal lower pole is unchanged. Hyperdense material in the right collecting system and bladder consistent with blood products, grossly similar. Stomach/Bowel: The stomach, small bowel and large bowel are normal in caliber without abnormal wall thickening or surrounding inflammatory changes. Reproductive: Prostate is unremarkable. Lymphatic: No enlarged lymph nodes in the abdomen or pelvis. Vasculature: The abdominal aorta is normal in caliber. Aortic atherosclerosis. Other: No abdominopelvic ascites. Musculoskeletal: No aggressive osseous lesions. Degenerative changes at L5-S1. Bone island in the left ilium. IMPRESSION: The small perinephric hematoma along the right renal lower pole is stable, and within expected limits after percutaneous biopsy. However, there remains substantial clot burden within the  bladder. Follow-up urology recommendations for management. Electronically Signed   By: Albin Felling M.D.   On: 08/30/2021 14:21   CT ABDOMEN PELVIS WO CONTRAST  Result  Date: 08/29/2021 CLINICAL DATA:  Post right-sided renal biopsy, now with hematuria and hypotension. EXAM: CT ABDOMEN AND PELVIS WITHOUT CONTRAST TECHNIQUE: Multidetector CT imaging of the abdomen and pelvis was performed following the standard protocol without IV contrast. COMPARISON:  CT abdomen pelvis-08/23/2021; ultrasound-guided right renal biopsy-earlier same day FINDINGS: The lack of intravenous contrast limits the ability to evaluate solid abdominal organs. Lower chest: Limited visualization of the lower thorax demonstrates interval development of trace bilateral effusion with worsening bibasilar heterogeneous/consolidative opacities, right greater than left. Previously identified nonspecific ground-glass opacities within the imaged lung bases is not seen on the present examination though there is mild residual intraseptal thickening. Normal heart size. Trace amount of pericardial fluid, unchanged presumably physiologic. There is diffuse decreased attenuation intra cardiac blood pool suggestive of anemia. Hepatobiliary: Normal hepatic contour. Apparent high density material within the gallbladder could represent biliary sludge. No definitive gallbladder wall thickening or pericholecystic stranding on this noncontrast examination. No ascites. Pancreas: Normal noncontrast appearance of the pancreas. Spleen: Normal noncontrast appearance of the spleen. Adrenals/Urinary Tract: There is a very tiny (approximately 2.7 x 1.6 x 1.2 cm) perinephric hematoma about the inferior pole of the right kidney (axial image 44, series 3; coronal image 57, series 6), with minimal amount of adjacent perinephric stranding. High-density material is seen within the right renal collecting system and ureter with moderate to large amount of layering high-density material within urinary bladder, findings compatible with hemorrhage into the collecting system and bladder. Mild associated right-sided pelviectasis and ureterectasis. Normal  noncontrast appearance of the left kidney. No evidence of left-sided nephrolithiasis or urinary obstruction. Normal noncontrast appearance of the bilateral adrenal glands. Stomach/Bowel: Scattered minimal colonic diverticulosis without evidence of superimposed acute diverticulitis on this noncontrast examination. Normal appearance of the terminal ileum. The appendix is not visualized compatible with provided operative history. No discrete areas of bowel wall thickening on this noncontrast examination. No pneumoperitoneum, pneumatosis or portal venous gas. Vascular/Lymphatic: Moderate amount of atherosclerotic plaque within normal caliber abdominal aorta. Scattered retroperitoneal lymph nodes are numerous though individually not enlarged by size criteria with index left sided periaortic lymph node measuring 0.7 cm in greatest short axis diameter (image 31, series 3), presumably reactive in etiology. No bulky retroperitoneal, mesenteric, pelvic or inguinal lymphadenopathy on this noncontrast examination Reproductive: Dystrophic calcifications within normal sized prostate gland. Trace amount of fluid within the pelvic cul-de-sac. Other: Small bilateral mesenteric fat containing inguinal hernias, left greater than right. Minimal amount of subcutaneous edema about the midline of the low back. Presumed shrapnel is seen within the right lower abdominal/pelvic ventral abdominal wall. Musculoskeletal: No acute or aggressive osseous abnormalities. Mild-to-moderate multilevel lumbar spine DDD, worse at L4-L5 and L5-S1 with disc space height loss, endplate irregularity and small posteriorly directed disc osteophyte complexes at these locations. Mild degenerative change the bilateral hips with joint space loss, subchondral sclerosis and osteophytosis, right greater than left. IMPRESSION: 1. Post right-sided renal biopsy complicated by tiny (approximately 2.7 cm) perinephric hematoma and bleeding into the right renal collecting  system including moderate to large-sized clot within the urinary bladder and associated mild right-sided pelviectasis and ureterectasis. Consideration for initiation of continuous bladder irrigation could be performed as indicated. 2. Trace bilateral effusions with associated bibasilar opacities, right greater than left, likely atelectasis. 3. Colonic diverticulosis without evidence superimposed acute diverticulitis. 4.  Aortic Atherosclerosis (ICD10-I70.0). Critical Value/emergent results were called by telephone at the time of interpretation on 08/28/2021 at 5:04 pm to provider [facility] , who verbally acknowledged these results. Electronically Signed   By: Sandi Mariscal M.D.   On: 08/29/2021 10:38   DG Chest 2 View  Result Date: 08/23/2021 CLINICAL DATA:  Near syncope. EXAM: CHEST - 2 VIEW COMPARISON:  None. FINDINGS: The lungs are clear without focal pneumonia, edema, pneumothorax or pleural effusion. Cardiopericardial silhouette is at upper limits of normal for size. The visualized bony structures of the thorax show no acute abnormality. Telemetry leads overlie the chest. IMPRESSION: No active cardiopulmonary disease. Electronically Signed   By: Misty Stanley M.D.   On: 08/23/2021 09:15   NM Pulmonary Perfusion  Result Date: 08/23/2021 CLINICAL DATA:  PE suspected, shortness of breath EXAM: NUCLEAR MEDICINE PERFUSION LUNG SCAN TECHNIQUE: Perfusion images were obtained in multiple projections after intravenous injection of radiopharmaceutical. Ventilation scans intentionally deferred if perfusion scan and chest x-ray adequate for interpretation during COVID 19 epidemic. RADIOPHARMACEUTICALS:  4.2 mCi Tc-90mMAA IV COMPARISON:  Same-day chest radiographs FINDINGS: Normal, homogeneous pulmonary perfusion. No suspicious filling defects. IMPRESSION: Very low probability examination for pulmonary embolism by modified perfusion only PIOPED criteria (PE absent). Electronically Signed   By: AEddie CandleM.D.    On: 08/23/2021 15:59   IR Angiogram Renal Left Selective  INDICATION: 70year old male with history of acute kidney injury of uncertain etiology status post ultrasound-guided right renal biopsy on 08/28/2021. Since biopsy, the patient has experienced gross hematuria with associated acute anemia.   EXAM: 1. Ultrasound-guided vascular access of the right internal jugular vein. 2. Temporary hemodialysis catheter placement. 3. Ultrasound-guided vascular access of the right common femoral artery. 4. Selective catheterization and angiography of the right renal artery. 5. Sub selective catheterization angiography of right inferior polar and arcuate artery branches. 6. Coil embolization of right inferior pole arcuate artery branch.   MEDICATIONS: None.   ANESTHESIA/SEDATION: Moderate (conscious) sedation was employed during this procedure. A total of Versed 3 mg and Fentanyl 50 mcg was administered intravenously.   Moderate Sedation Time: 66 minutes. The patient's level of consciousness and vital signs were monitored continuously by radiology nursing throughout the procedure under my direct supervision.   CONTRAST:  22mOMNIPAQUE IOHEXOL 350 MG/ML SOLN, 5046mMNIPAQUE IOHEXOL 350 MG/ML SOLN   FLUOROSCOPY TIME:  Fluoroscopy Time: 10.6 minutes, (811 mGy).   COMPLICATIONS: None immediate.   PROCEDURE: Informed consent was obtained from the patient following explanation of the procedure, risks, benefits and alternatives. The patient understands, agrees and consents for the procedure. All questions were addressed. A time out was performed prior to the initiation of the procedure. Maximal barrier sterile technique utilized including caps, mask, sterile gowns, sterile gloves, large sterile drape, hand hygiene, and chlorhexidine prep.   Preprocedure ultrasound evaluation of the right internal jugular vein demonstrated a patent and compressible vein free of internal echoes. Procedure was planned. Subdermal Local anesthesia was  provided 1% lidocaine. A small skin nick was made. Under direct ultrasound visualization, a 21 gauge micropuncture needle was directed into the internal jugular vein. An image was captured and stored in the permanent record. A micropuncture set was inserted and exchanged for a J wire which was positioned in the inferior vena cava. Serial dilation was performed followed by placement of a 12.5 French, 24 cm Trialysis catheter. The catheter tip was positioned in the right atrium. Each lumen flushed and aspirated appropriately. The dialysis ports were  locked with appropriate volume of heparin dwell. The middle central venous port was then used for sedation for the remainder of the procedure. The catheter was secured with a 0 silk retention suture. A sterile bandage was applied.   Preprocedure ultrasound evaluation of the right groin was performed which demonstrated a patent right common femoral artery. The procedure was planned. Subdermal Local anesthesia was provided with 1% lidocaine. A small skin nick was made. Under direct ultrasound visualization, a 21 gauge micropuncture needle was directed into the common femoral artery. An ultrasound image was captured and stored in the permanent record. A micro puncture set was inserted and a limited right lower extremity angiogram was performed which demonstrated appropriate puncture site for closure device use. A J wire was directed to the abdominal aorta and the micropuncture set was exchanged for a 5 [place] vascular sheath. A 5 French C2 catheter was then directed into the right renal ostium. Right renal angiogram was performed. The single main renal artery was patent. 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   A straight lantern microcatheter and 0.014" soft synchro  wire was then inserted and directed into the inferior polar branch. Repeat angiogram was performed in the sub selective location which was significant for multifocal 2-4 mm pseudoaneurysm formation, abnormal truncation and irregularity of the arcuate and interlobular branches, an early arteriovenous shunting. The inferior polar arcuate branch was selected further. Coil embolization was performed with multiple low profile Penumbra Ruby coils ranging from 2-3 mm in diameter. Completion right inferior renal angiogram was performed which demonstrated appropriate embolization of the targeted vessels without persistent arteriovenous fistula or vessel irregularity.   The catheters were removed. The right common femoral artery was then closed with a 6 [place] Angio-Seal device. Distal pulses were unchanged. The patient tolerated the procedure well was transferred back to the floor in good condition.   IMPRESSION: 1. 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 2. Sub selective coil embolization of right inferior polar arcuate artery branch. 3. Successful placement of right internal jugular, 24 French Trialysis catheter with the catheter tip in the right atrium. The catheter is ready for immediate use.   Ruthann Cancer, MD   [facility]   [facility]     Electronically Signed   By: Ruthann Cancer M.D.   On: 08/31/2021 09:29    IR Fluoro Guide CV Line Right  Result Date: 08/31/2021 INDICATION: 70 year old male with history of acute kidney injury of uncertain etiology status post ultrasound-guided right renal biopsy on 08/28/2021. Since biopsy, the patient has experienced gross hematuria with associated acute anemia. EXAM: 1. Ultrasound-guided vascular access of the right internal jugular vein. 2. Temporary hemodialysis catheter placement.  3. Ultrasound-guided vascular access of the right common femoral artery. 4. Selective catheterization and angiography of the right renal artery. 5. Sub selective catheterization angiography of right inferior polar and arcuate artery branches. 6. Coil embolization of right inferior pole arcuate artery branch. MEDICATIONS: None. ANESTHESIA/SEDATION: Moderate (conscious) sedation was employed during this procedure. A total of Versed 3 mg and Fentanyl 50 mcg was administered intravenously. Moderate Sedation Time: 66 minutes. The patient's level of consciousness and vital signs were monitored continuously by radiology nursing throughout  the procedure under my direct supervision. CONTRAST:  50m OMNIPAQUE IOHEXOL 350 MG/ML SOLN, 583mOMNIPAQUE IOHEXOL 350 MG/ML SOLN FLUOROSCOPY TIME:  Fluoroscopy Time: 10.6 minutes, (811 mGy). COMPLICATIONS: None immediate. PROCEDURE: Informed consent was obtained from the patient following explanation of the procedure, risks, benefits and alternatives. The patient understands, agrees and consents for the procedure. All questions were addressed. A time out was performed prior to the initiation of the procedure. Maximal barrier sterile technique utilized including caps, mask, sterile gowns, sterile gloves, large sterile drape, hand hygiene, and chlorhexidine prep. Preprocedure ultrasound evaluation of the right internal jugular vein demonstrated a patent and compressible vein free of internal echoes. Procedure was planned. Subdermal Local anesthesia was provided 1% lidocaine. A small skin nick was made. Under direct ultrasound visualization, a 21 gauge micropuncture needle was directed into the internal jugular vein. An image was captured and stored in the permanent record. A micropuncture set was inserted and exchanged for a J wire which was positioned in the inferior vena cava. Serial dilation was performed followed by placement of a 12.5 French, 24 cm Trialysis catheter. The catheter  tip was positioned in the right atrium. Each lumen flushed and aspirated appropriately. The dialysis ports were locked with appropriate volume of heparin dwell. The middle central venous port was then used for sedation for the remainder of the procedure. The catheter was secured with a 0 silk retention suture. A sterile bandage was applied. Preprocedure ultrasound evaluation of the right groin was performed which demonstrated a patent right common femoral artery. The procedure was planned. Subdermal Local anesthesia was provided with 1% lidocaine. A small skin nick was made. Under direct ultrasound visualization, a 21 gauge micropuncture needle was directed into the common femoral artery. An ultrasound image was captured and stored in the permanent record. A micro puncture set was inserted and a limited right lower extremity angiogram was performed which demonstrated appropriate puncture site for closure device use. A J wire was directed to the abdominal aorta and the micropuncture set was exchanged for a 5 [place] sheath. A 5 French C2 catheter was then directed into the right renal ostium. Right renal angiogram was performed. The single main renal artery was patent. 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 A straight lantern microcatheter and 0.014" soft synchro wire was then inserted and directed into the inferior polar branch. Repeat angiogram was performed in the sub selective location which was significant for multifocal 2-4 mm pseudoaneurysm formation, abnormal truncation and irregularity of the arcuate and interlobular branches, an early arteriovenous shunting. The inferior polar arcuate branch was selected further. Coil embolization was performed with multiple low profile Penumbra Ruby coils  ranging from 2-3 mm in diameter. Completion right inferior renal angiogram was performed which demonstrated appropriate embolization of the targeted vessels without persistent arteriovenous fistula or vessel irregularity. The catheters were removed. The right common femoral artery was then closed with a 6 Fr[place]-Seal device. Distal pulses were unchanged. The patient tolerated the procedure well was transferred back to the floor in good condition. IMPRESSION: 1. 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 2. Sub selective coil embolization of right inferior polar arcuate artery branch. 3. Successful placement of right internal jugular, 24  [place] Trialysis catheter with the catheter tip in the right atrium. The catheter is ready for immediate use. Ruthann Cancer, MD [facility] Radiology Electronically Signed   By: Ruthann Cancer M.D.   On: 08/31/2021 09:29   IR US Guide Vasc Access Right  Result Date: 08/31/2021 INDICATION: 70 year old male with history of acute kidney injury of uncertain etiology status post ultrasound-guided right renal biopsy on 08/28/2021. Since biopsy, the patient has experienced gross hematuria with associated acute anemia. EXAM: 1. Ultrasound-guided vascular access of the right internal jugular vein. 2. Temporary hemodialysis catheter placement. 3. Ultrasound-guided vascular access of the right common femoral artery. 4. Selective catheterization and angiography of the right renal artery. 5. Sub selective catheterization angiography of right inferior polar and arcuate artery branches. 6. Coil embolization of right inferior pole arcuate artery branch. MEDICATIONS: None. ANESTHESIA/SEDATION: Moderate (conscious) sedation was employed during this procedure. A total of Versed 3 mg and Fentanyl 50 mcg  was administered intravenously. Moderate Sedation Time: 66 minutes. The patient's level of consciousness and vital signs were monitored continuously by radiology nursing throughout the procedure under my direct supervision. CONTRAST:  71m OMNIPAQUE IOHEXOL 350 MG/ML SOLN, 561mOMNIPAQUE IOHEXOL 350 MG/ML SOLN FLUOROSCOPY TIME:  Fluoroscopy Time: 10.6 minutes, (811 mGy). COMPLICATIONS: None immediate. PROCEDURE: Informed consent was obtained from the patient following explanation of the procedure, risks, benefits and alternatives. The patient understands, agrees and consents for the procedure. All questions were addressed. A time out was performed prior to the initiation of the procedure. Maximal barrier sterile technique utilized including caps, mask, sterile gowns, sterile gloves, large sterile drape, hand hygiene, and chlorhexidine prep. Preprocedure ultrasound evaluation of the right internal jugular vein demonstrated a patent and compressible vein free of internal echoes. Procedure was planned. Subdermal Local anesthesia was provided 1% lidocaine. A small skin nick was made. Under direct ultrasound visualization, a 21 gauge micropuncture needle was directed into the internal jugular vein. An image was captured and stored in the permanent record. A micropuncture set was inserted and exchanged for a J wire which was positioned in the inferior vena cava. Serial dilation was performed followed by placement of a 12.5 French, 24 cm Trialysis catheter. The catheter tip was positioned in the right atrium. Each lumen flushed and aspirated appropriately. The dialysis ports were locked with appropriate volume of heparin dwell. The middle central venous port was then used for sedation for the remainder of the procedure. The catheter was secured with a 0 silk retention suture. A sterile bandage was applied. Preprocedure ultrasound evaluation of the right groin was performed which demonstrated a patent right common femoral  artery. The procedure was planned. Subdermal Local anesthesia was provided with 1% lidocaine. A small skin nick was made. Under direct ultrasound visualization, a 21 gauge micropuncture needle was directed into the common femoral artery. An ultrasound image was captured and stored in the permanent record. A micro puncture set was inserted and a limited right lower extremity angiogram was performed which demonstrated appropriate puncture site for closure device use. A J wire was directed to the abdominal aorta and the micropuncture set was exchanged for a 5 Fr[place] sheath. A 5 French C2 catheter was then directed into the right renal ostium. Right renal angiogram was performed. The single main renal artery was patent. About 2 arcuate artery branches in the inferior pole there is abnormal truncation and vessel irregularity with evidence of an early filling arteriovenous fistula in addition to suggestion of faint filling  into the collecting system on delayed imaging. A straight lantern microcatheter and 0.014" soft synchro wire was then inserted and directed into the inferior polar branch. Repeat angiogram was performed in the sub selective location which was significant for multifocal 2-4 mm pseudoaneurysm formation, abnormal truncation and irregularity of the arcuate and interlobular branches, an early arteriovenous shunting. The inferior polar arcuate branch was selected further. Coil embolization was performed with multiple low profile Penumbra Ruby coils ranging from 2-3 mm in diameter. Completion right inferior renal angiogram was performed which demonstrated appropriate embolization of the targeted vessels without persistent arteriovenous fistula or vessel irregularity. The catheters were removed. The right common femoral artery was then closed with a 6 [place] Angio-Seal device. Distal pulses were unchanged. The patient tolerated the procedure well was transferred back to the floor in good condition.  IMPRESSION: 1. 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 2. Sub selective coil embolization of right inferior polar arcuate artery branch. 3. Successful placement of right internal jugular, 24 French Trialysis catheter with the catheter tip in the right atrium. The catheter is ready for immediate use. Ruthann Cancer, MD [facility] Radiology Electronically Signed   By: Ruthann Cancer M.D.   On: 08/31/2021 09:29   IR US Guide Vasc Access Right  Result Date: 08/31/2021 INDICATION: 70 year old male with history of acute kidney injury of uncertain etiology status post ultrasound-guided right renal biopsy on 08/28/2021. Since biopsy, the patient has experienced gross hematuria with associated acute anemia. EXAM: 1. Ultrasound-guided vascular access of the right internal jugular vein. 2. Temporary hemodialysis catheter placement. 3. Ultrasound-guided vascular access of the right common femoral artery. 4. Selective catheterization and angiography of the right renal artery. 5. Sub selective catheterization angiography of right inferior polar and arcuate artery branches. 6. Coil embolization of right inferior pole arcuate artery branch. MEDICATIONS: None. ANESTHESIA/SEDATION: Moderate (conscious) sedation was employed during this procedure. A total of Versed 3 mg and Fentanyl 50 mcg was administered intravenously. Moderate Sedation Time: 66 minutes. The patient's level of consciousness and vital signs were monitored continuously by radiology nursing throughout the procedure under my direct supervision. CONTRAST:  25m OMNIPAQUE IOHEXOL 350 MG/ML SOLN, 538mOMNIPAQUE IOHEXOL 350 MG/ML SOLN FLUOROSCOPY TIME:  Fluoroscopy Time: 10.6 minutes, (811 mGy). COMPLICATIONS: None immediate. PROCEDURE: Informed consent was obtained from the  patient following explanation of the procedure, risks, benefits and alternatives. The patient understands, agrees and consents for the procedure. All questions were addressed. A time out was performed prior to the initiation of the procedure. Maximal barrier sterile technique utilized including caps, mask, sterile gowns, sterile gloves, large sterile drape, hand hygiene, and chlorhexidine prep. Preprocedure ultrasound evaluation of the right internal jugular vein demonstrated a patent and compressible vein free of internal echoes. Procedure was planned. Subdermal Local anesthesia was provided 1% lidocaine. A small skin nick was made. Under direct ultrasound visualization, a 21 gauge micropuncture needle was directed into the internal jugular vein. An image was captured and stored in the permanent record. A micropuncture set was inserted and exchanged for a J wire which was positioned in the inferior vena cava. Serial dilation was performed followed by placement of a 12.5 French, 24 cm Trialysis catheter. The catheter tip was positioned in the right atrium. Each lumen flushed and aspirated appropriately. The dialysis ports were locked with appropriate volume of heparin dwell. The middle central venous port  was then used for sedation for the remainder of the procedure. The catheter was secured with a 0 silk retention suture. A sterile bandage was applied. Preprocedure ultrasound evaluation of the right groin was performed which demonstrated a patent right common femoral artery. The procedure was planned. Subdermal Local anesthesia was provided with 1% lidocaine. A small skin nick was made. Under direct ultrasound visualization, a 21 gauge micropuncture needle was directed into the common femoral artery. An ultrasound image was captured and stored in the permanent record. A micro puncture set was inserted and a limited right lower extremity angiogram was performed which demonstrated appropriate puncture site for  closure device use. A J wire was directed to the abdominal aorta and the micropuncture set was exchanged for a 5 [place] vascular sheath. A 5 French C2 catheter was then directed into the right renal ostium. Right renal angiogram was performed. The single main renal artery was patent. 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 A straight lantern microcatheter and 0.014" soft synchro wire was then inserted and directed into the inferior polar branch. Repeat angiogram was performed in the sub selective location which was significant for multifocal 2-4 mm pseudoaneurysm formation, abnormal truncation and irregularity of the arcuate and interlobular branches, an early arteriovenous shunting. The inferior polar arcuate branch was selected further. Coil embolization was performed with multiple low profile Penumbra Ruby coils ranging from 2-3 mm in diameter. Completion right inferior renal angiogram was performed which demonstrated appropriate embolization of the targeted vessels without persistent arteriovenous fistula or vessel irregularity. The catheters were removed. The right common femoral artery was then closed with a 6 [place] Angio-Seal device. Distal pulses were unchanged. The patient tolerated the procedure well was transferred back to the floor in good condition. IMPRESSION: 1. 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 2. Sub selective coil embolization of right inferior polar arcuate artery branch. 3. Successful placement of right internal jugular, 24 French Trialysis catheter with the catheter tip in the right atrium. The catheter is ready for immediate use.  Ruthann Cancer, MD [facility] Radiology Electronically Signed   By: Ruthann Cancer M.D.   On: 08/31/2021 09:29   DG Chest Port 1 View  Result Date: 09/03/2021 CLINICAL DATA:  Shortness of breath EXAM: PORTABLE CHEST 1 VIEW COMPARISON:  08/23/2021 FINDINGS: Right dialysis catheter in place with the tip in the right atrium. Heart is normal size. Mild vascular congestion and interstitial prominence throughout the lungs, likely mild edema. No effusions or acute bony abnormality. IMPRESSION: Suspect mild pulmonary edema. Electronically Signed   By: Rolm Baptise M.D.   On: 09/03/2021 09:40   ECHOCARDIOGRAM COMPLETE  Result Date: 08/24/2021    ECHOCARDIOGRAM REPORT   Patient Name:   Caleb Simmons Date of Exam: 08/24/2021 Medical Rec #:  [id number]    Height:       70.0 in Accession #:    [id number]   Weight:       180.0 lb Date of Birth:  [birth year]     BSA:          1.996 m Patient Age:    70 years     BP:  120/69 mmHg Patient Gender: M            HR:           85 bpm. Exam Location:  Inpatient Procedure: 2D Echo, Cardiac Doppler and Color Doppler Indications:    Abnormal EKG  History:        Patient has no prior history of Echocardiogram examinations.                 COPD, Signs/Symptoms:Dyspnea and Weakness, renal disease,                 anemia, elevated troponin; Risk Factors:Current Smoker.  Sonographer:    Dustin Flock RDCS Referring Phys: [place]  Sonographer Comments: Image acquisition challenging due to COPD. IMPRESSIONS  1. Left ventricular ejection fraction, by estimation, is 55 to 60%. The left ventricle has normal function. The left ventricle has no regional wall motion abnormalities. There is mild concentric left ventricular hypertrophy. Left ventricular diastolic parameters are consistent with Grade I diastolic dysfunction (impaired relaxation).  2. Right ventricular systolic function is normal. The right ventricular size is  normal. Tricuspid regurgitation signal is inadequate for assessing PA pressure.  3. The mitral valve is grossly normal. No evidence of mitral valve regurgitation. No evidence of mitral stenosis.  4. The aortic valve was not well visualized. Aortic valve regurgitation is not visualized. No aortic stenosis is present.  5. The inferior vena cava is normal in size with greater than 50% respiratory variability, suggesting right atrial pressure of 3 mmHg. Comparison(s): No prior Echocardiogram. FINDINGS  Left Ventricle: Left ventricular ejection fraction, by estimation, is 55 to 60%. The left ventricle has normal function. The left ventricle has no regional wall motion abnormalities. The left ventricular internal cavity size was normal in size. There is  mild concentric left ventricular hypertrophy. Left ventricular diastolic parameters are consistent with Grade I diastolic dysfunction (impaired relaxation). Right Ventricle: The right ventricular size is normal. No increase in right ventricular wall thickness. Right ventricular systolic function is normal. Tricuspid regurgitation signal is inadequate for assessing PA pressure. Left Atrium: Left atrial size was normal in size. Right Atrium: Right atrial size was normal in size. Pericardium: There is no evidence of pericardial effusion. Mitral Valve: The mitral valve is grossly normal. No evidence of mitral valve regurgitation. No evidence of mitral valve stenosis. Tricuspid Valve: The tricuspid valve is normal in structure. Tricuspid valve regurgitation is not demonstrated. No evidence of tricuspid stenosis. Aortic Valve: The aortic valve was not well visualized. Aortic valve regurgitation is not visualized. No aortic stenosis is present. Pulmonic Valve: The pulmonic valve was not well visualized. Pulmonic valve regurgitation is not visualized. Aorta: The aortic root is normal in size and structure. Venous: The inferior vena cava is normal in size with greater than 50%  respiratory variability, suggesting right atrial pressure of 3 mmHg. IAS/Shunts: The atrial septum is grossly normal.  LEFT VENTRICLE PLAX 2D LVIDd:         5.80 cm      Diastology LVIDs:         3.60 cm      LV e' medial:    5.55 cm/s LV PW:         1.30 cm      LV E/e' medial:  14.5 LV IVS:        1.30 cm      LV e' lateral:   6.74 cm/s LVOT diam:     2.40 cm  LV E/e' lateral: 12.0 LV SV:         76 LV SV Index:   38 LVOT Area:     4.52 cm  LV Volumes (MOD) LV vol d, MOD A4C: 124.0 ml LV vol s, MOD A4C: 47.8 ml LV SV MOD A4C:     124.0 ml RIGHT VENTRICLE RV Basal diam:  2.80 cm RV S prime:     10.10 cm/s TAPSE (M-mode): 2.9 cm LEFT ATRIUM             Index       RIGHT ATRIUM           Index LA diam:        3.80 cm 1.90 cm/m  RA Area:     13.10 cm LA Vol (A2C):   31.8 ml 15.93 ml/m RA Volume:   29.70 ml  14.88 ml/m LA Vol (A4C):   33.0 ml 16.53 ml/m LA Biplane Vol: 34.9 ml 17.49 ml/m  AORTIC VALVE LVOT Vmax:   98.30 cm/s LVOT Vmean:  60.300 cm/s LVOT VTI:    0.169 m  AORTA Ao Root diam: 3.30 cm MITRAL VALVE MV Area (PHT): 3.85 cm    SHUNTS MV Decel Time: 197 msec    Systemic VTI:  0.17 m MV E velocity: 80.60 cm/s  Systemic Diam: 2.40 cm MV A velocity: 49.30 cm/s MV E/A ratio:  1.63 Rudean Haskell MD Electronically signed by Rudean Haskell MD Signature Date/Time: 08/24/2021/11:38:40 AM    Final    CT Renal Stone Study  Result Date: 08/23/2021 CLINICAL DATA:  Hematuria, new renal failure EXAM: CT ABDOMEN AND PELVIS WITHOUT CONTRAST TECHNIQUE: Multidetector CT imaging of the abdomen and pelvis was performed following the standard protocol without IV contrast. COMPARISON:  06/26/2007 FINDINGS: Lower chest: There is mild, scattered, nonspecific ground-glass and fine nodularity throughout the included bilateral lung bases (series 5, 4). Hepatobiliary: No solid liver abnormality is seen. No gallstones, gallbladder wall thickening, or biliary dilatation. Pancreas: Unremarkable. No pancreatic ductal  dilatation or surrounding inflammatory changes. Spleen: Normal in size without significant abnormality. Adrenals/Urinary Tract: Adrenal glands are unremarkable. Kidneys are normal, without renal calculi, solid lesion, or hydronephrosis. Distended urinary bladder, measuring at least 17.4 cm. Stomach/Bowel: Stomach is within normal limits. Appendix not clearly visualized and may be surgically. No evidence of bowel wall thickening, distention, or inflammatory changes. Descending and sigmoid diverticulosis. Vascular/Lymphatic: Aortic atherosclerosis. No enlarged abdominal or pelvic lymph nodes. Reproductive: No mass or other significant abnormality. Other: Small, fat containing bilateral inguinal hernias no abdominopelvic ascites. Musculoskeletal: No acute or significant osseous findings. IMPRESSION: 1. No evidence of urinary tract calculus or hydronephrosis. 2. Distended urinary bladder, measuring at least 17.4 cm. Correlate for urinary retention. 3. Descending and sigmoid diverticulosis without evidence of acute diverticulitis. 4. Mild, nonspecific scattered ground-glass and fine nodularity throughout the bilateral lung bases, possibly infectious or inflammatory. Aortic Atherosclerosis (ICD10-I70.0). Electronically Signed   By: Eddie Candle M.D.   On: 08/23/2021 16:04   US BIOPSY (KIDNEY)  Result Date: 08/28/2021 INDICATION: Acute kidney injury of uncertain etiology. Please perform image guided biopsy for tissue diagnostic purposes. EXAM: ULTRASOUND GUIDED RENAL BIOPSY COMPARISON:  CT abdomen and pelvis-08/23/2021 MEDICATIONS: None. ANESTHESIA/SEDATION: Fentanyl 100 mcg IV; Versed 2 mg IV Total Moderate Sedation time: 14 minutes; The patient was continuously monitored during the procedure by the interventional radiology nurse under my direct supervision. COMPLICATIONS: None immediate. PROCEDURE: Informed written consent was obtained from the patient after a discussion of the risks, benefits and  alternatives to  treatment. The patient understands and consents the procedure. A timeout was performed prior to the initiation of the procedure. Ultrasound scanning was performed of the bilateral flanks. The inferior pole of the right kidney was selected for biopsy due to location and sonographic window. The procedure was planned. The operative site was prepped and draped in the usual sterile fashion. The overlying soft tissues were anesthetized with 1% lidocaine with epinephrine. A 17 gauge core needle biopsy device was advanced into the inferior cortex of the right kidney and 3 core biopsies were obtained under direct ultrasound guidance. Images were saved for documentation purposes. The biopsy device was removed and hemostasis was obtained with manual compression. Post procedural scanning was negative for significant post procedural hemorrhage or additional complication. A dressing was placed. The patient tolerated the procedure well without immediate post procedural complication. IMPRESSION: Technically successful ultrasound guided right renal biopsy. Electronically Signed   By: Sandi Mariscal M.D.   On: 08/28/2021 12:52   IR EMBO ART  VEN HEMORR LYMPH EXTRAV  INC GUIDE ROADMAPPING  Result Date: 08/31/2021 INDICATION: 70 year old male with history of acute kidney injury of uncertain etiology status post ultrasound-guided right renal biopsy on 08/28/2021. Since biopsy, the patient has experienced gross hematuria with associated acute anemia. EXAM: 1. Ultrasound-guided vascular access of the right internal jugular vein. 2. Temporary hemodialysis catheter placement. 3. Ultrasound-guided vascular access of the right common femoral artery. 4. Selective catheterization and angiography of the right renal artery. 5. Sub selective catheterization angiography of right inferior polar and arcuate artery branches. 6. Coil embolization of right inferior pole arcuate artery branch. MEDICATIONS: None. ANESTHESIA/SEDATION: Moderate (conscious)  sedation was employed during this procedure. A total of Versed 3 mg and Fentanyl 50 mcg was administered intravenously. Moderate Sedation Time: 66 minutes. The patient's level of consciousness and vital signs were monitored continuously by radiology nursing throughout the procedure under my direct supervision. CONTRAST:  70m OMNIPAQUE IOHEXOL 350 MG/ML SOLN, 521mOMNIPAQUE IOHEXOL 350 MG/ML SOLN FLUOROSCOPY TIME:  Fluoroscopy Time: 10.6 minutes, (811 mGy). COMPLICATIONS: None immediate. PROCEDURE: Informed consent was obtained from the patient following explanation of the procedure, risks, benefits and alternatives. The patient understands, agrees and consents for the procedure. All questions were addressed. A time out was performed prior to the initiation of the procedure. Maximal barrier sterile technique utilized including caps, mask, sterile gowns, sterile gloves, large sterile drape, hand hygiene, and chlorhexidine prep. Preprocedure ultrasound evaluation of the right internal jugular vein demonstrated a patent and compressible vein free of internal echoes. Procedure was planned. Subdermal Local anesthesia was provided 1% lidocaine. A small skin nick was made. Under direct ultrasound visualization, a 21 gauge micropuncture needle was directed into the internal jugular vein. An image was captured and stored in the permanent record. A micropuncture set was inserted and exchanged for a J wire which was positioned in the inferior vena cava. Serial dilation was performed followed by placement of a 12.5 French, 24 cm Trialysis catheter. The catheter tip was positioned in the right atrium. Each lumen flushed and aspirated appropriately. The dialysis ports were locked with appropriate volume of heparin dwell. The middle central venous port was then used for sedation for the remainder of the procedure. The catheter was secured with a 0 silk retention suture. A sterile bandage was applied. Preprocedure ultrasound  evaluation of the right groin was performed which demonstrated a patent right common femoral artery. The procedure was planned. Subdermal Local anesthesia was provided with 1% lidocaine. A  small skin nick was made. Under direct ultrasound visualization, a 21 gauge micropuncture needle was directed into the common femoral artery. An ultrasound image was captured and stored in the permanent record. A micro puncture set was inserted and a limited right lower extremity angiogram was performed which demonstrated appropriate puncture site for closure device use. A J wire was directed to the abdominal aorta and the micropuncture set was exchanged for a 5 [place] vascular sheath. A 5 French C2 catheter was then directed into the right renal ostium. Right renal angiogram was performed. The single main renal artery was patent. 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 A straight lantern microcatheter and 0.014" soft synchro wire was then inserted and directed into the inferior polar branch. Repeat angiogram was performed in the sub selective location which was significant for multifocal 2-4 mm pseudoaneurysm formation, abnormal truncation and irregularity of the arcuate and interlobular branches, an early arteriovenous shunting. The inferior polar arcuate branch was selected further. Coil embolization was performed with multiple low profile Penumbra Ruby coils ranging from 2-3 mm in diameter. Completion right inferior renal angiogram was performed which demonstrated appropriate embolization of the targeted vessels without persistent arteriovenous fistula or vessel irregularity. The catheters were removed. The right common femoral artery was then closed with a 6 [place] Angio-Seal device. Distal pulses were unchanged. The  patient tolerated the procedure well was transferred back to the floor in good condition. IMPRESSION: 1. 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 2. Sub selective coil embolization of right inferior polar arcuate artery branch. 3. Successful placement of right internal jugular, 24 French Trialysis catheter with the catheter tip in the right atrium. The catheter is ready for immediate use. Ruthann Cancer, MD [facility] Radiology Electronically Signed   By: Ruthann Cancer M.D.   On: 08/31/2021 09:29     CT ABDOMEN PELVIS WO CONTRAST  Result Date: 08/30/2021 CLINICAL DATA:  Retroperitoneal hematoma, follow up s/p random renal bx with perinephric hematoma development EXAM: CT ABDOMEN AND PELVIS WITHOUT CONTRAST TECHNIQUE: Multidetector CT imaging of the abdomen and pelvis was performed following the standard protocol without IV contrast. COMPARISON:  September 6 FINDINGS: Inferior chest: Trace bilateral pleural effusions with right greater than left compressive subsegmental atelectasis. Hepatobiliary: The liver is normal in size without focal abnormality. No intrahepatic or extrahepatic biliary ductal dilation. The gallbladder appears normal. Spleen: Normal in size without focal abnormality. Pancreas: No pancreatic ductal dilatation or surrounding inflammatory changes. Adrenals/Urinary Tract: Adrenal glands are unremarkable. The kidneys are normal in size. Tiny perinephric hematoma along the right renal lower pole is unchanged. Hyperdense material in the right collecting system and bladder consistent with blood products, grossly similar. Stomach/Bowel: The stomach, small bowel and large bowel are normal in caliber without abnormal wall thickening or surrounding inflammatory changes. Reproductive: Prostate is unremarkable.  Lymphatic: No enlarged lymph nodes in the abdomen or pelvis. Vasculature: The abdominal aorta is normal in caliber. Aortic atherosclerosis. Other: No abdominopelvic ascites. Musculoskeletal: No aggressive osseous lesions. Degenerative changes at L5-S1. Bone island in the left ilium. IMPRESSION: The small perinephric hematoma along the right renal lower pole is stable, and within expected limits after percutaneous biopsy. However, there remains substantial  clot burden within the bladder. Follow-up urology recommendations for management. Electronically Signed   By: Albin Felling M.D.   On: 08/30/2021 14:21   CT ABDOMEN PELVIS WO CONTRAST  Result Date: 08/29/2021 CLINICAL DATA:  Post right-sided renal biopsy, now with hematuria and hypotension. EXAM: CT ABDOMEN AND PELVIS WITHOUT CONTRAST TECHNIQUE: Multidetector CT imaging of the abdomen and pelvis was performed following the standard protocol without IV contrast. COMPARISON:  CT abdomen pelvis-08/23/2021; ultrasound-guided right renal biopsy-earlier same day FINDINGS: The lack of intravenous contrast limits the ability to evaluate solid abdominal organs. Lower chest: Limited visualization of the lower thorax demonstrates interval development of trace bilateral effusion with worsening bibasilar heterogeneous/consolidative opacities, right greater than left. Previously identified nonspecific ground-glass opacities within the imaged lung bases is not seen on the present examination though there is mild residual intraseptal thickening. Normal heart size. Trace amount of pericardial fluid, unchanged presumably physiologic. There is diffuse decreased attenuation intra cardiac blood pool suggestive of anemia. Hepatobiliary: Normal hepatic contour. Apparent high density material within the gallbladder could represent biliary sludge. No definitive gallbladder wall thickening or pericholecystic stranding on this noncontrast examination. No ascites. Pancreas: Normal  noncontrast appearance of the pancreas. Spleen: Normal noncontrast appearance of the spleen. Adrenals/Urinary Tract: There is a very tiny (approximately 2.7 x 1.6 x 1.2 cm) perinephric hematoma about the inferior pole of the right kidney (axial image 44, series 3; coronal image 57, series 6), with minimal amount of adjacent perinephric stranding. High-density material is seen within the right renal collecting system and ureter with moderate to large amount of layering high-density material within urinary bladder, findings compatible with hemorrhage into the collecting system and bladder. Mild associated right-sided pelviectasis and ureterectasis. Normal noncontrast appearance of the left kidney. No evidence of left-sided nephrolithiasis or urinary obstruction. Normal noncontrast appearance of the bilateral adrenal glands. Stomach/Bowel: Scattered minimal colonic diverticulosis without evidence of superimposed acute diverticulitis on this noncontrast examination. Normal appearance of the terminal ileum. The appendix is not visualized compatible with provided operative history. No discrete areas of bowel wall thickening on this noncontrast examination. No pneumoperitoneum, pneumatosis or portal venous gas. Vascular/Lymphatic: Moderate amount of atherosclerotic plaque within normal caliber abdominal aorta. Scattered retroperitoneal lymph nodes are numerous though individually not enlarged by size criteria with index left sided periaortic lymph node measuring 0.7 cm in greatest short axis diameter (image 31, series 3), presumably reactive in etiology. No bulky retroperitoneal, mesenteric, pelvic or inguinal lymphadenopathy on this noncontrast examination Reproductive: Dystrophic calcifications within normal sized prostate gland. Trace amount of fluid within the pelvic cul-de-sac. Other: Small bilateral mesenteric fat containing inguinal hernias, left greater than right. Minimal amount of subcutaneous edema about the  midline of the low back. Presumed shrapnel is seen within the right lower abdominal/pelvic ventral abdominal wall. Musculoskeletal: No acute or aggressive osseous abnormalities. Mild-to-moderate multilevel lumbar spine DDD, worse at L4-L5 and L5-S1 with disc space height loss, endplate irregularity and small posteriorly directed disc osteophyte complexes at these locations. Mild degenerative change the bilateral hips with joint space loss, subchondral sclerosis and osteophytosis, right greater than left. IMPRESSION: 1. Post right-sided renal biopsy complicated by tiny (approximately 2.7 cm) perinephric hematoma and bleeding into the right renal collecting system including moderate to large-sized clot within the urinary bladder and associated mild right-sided pelviectasis and ureterectasis. Consideration for initiation of continuous bladder irrigation could be performed as indicated. 2. Trace bilateral effusions with associated bibasilar opacities, right greater than left, likely atelectasis. 3. Colonic diverticulosis without evidence superimposed acute  diverticulitis. 4.  Aortic Atherosclerosis (ICD10-I70.0). Critical Value/emergent results were called by telephone at the time of interpretation on 08/28/2021 at 5:04 pm to provider [facility] , who verbally acknowledged these results. Electronically Signed   By: Sandi Mariscal M.D.   On: 08/29/2021 10:38   DG Chest 2 View  Result Date: 08/23/2021 CLINICAL DATA:  Near syncope. EXAM: CHEST - 2 VIEW COMPARISON:  None. FINDINGS: The lungs are clear without focal pneumonia, edema, pneumothorax or pleural effusion. Cardiopericardial silhouette is at upper limits of normal for size. The visualized bony structures of the thorax show no acute abnormality. Telemetry leads overlie the chest. IMPRESSION: No active cardiopulmonary disease. Electronically Signed   By: Misty Stanley M.D.   On: 08/23/2021 09:15   NM Pulmonary Perfusion  Result Date: 08/23/2021 CLINICAL DATA:   PE suspected, shortness of breath EXAM: NUCLEAR MEDICINE PERFUSION LUNG SCAN TECHNIQUE: Perfusion images were obtained in multiple projections after intravenous injection of radiopharmaceutical. Ventilation scans intentionally deferred if perfusion scan and chest x-ray adequate for interpretation during COVID 19 epidemic. RADIOPHARMACEUTICALS:  4.2 mCi Tc-78mMAA IV COMPARISON:  Same-day chest radiographs FINDINGS: Normal, homogeneous pulmonary perfusion. No suspicious filling defects. IMPRESSION: Very low probability examination for pulmonary embolism by modified perfusion only PIOPED criteria (PE absent). Electronically Signed   By: AEddie CandleM.D.   On: 08/23/2021 15:59   IR Angiogram Renal Left Selective  INDICATION: 70year old male with history of acute kidney injury of uncertain etiology status post ultrasound-guided right renal biopsy on 08/28/2021. Since biopsy, the patient has experienced gross hematuria with associated acute anemia.   EXAM: 1. Ultrasound-guided vascular access of the right internal jugular vein. 2. Temporary hemodialysis catheter placement. 3. Ultrasound-guided vascular access of the right common femoral artery. 4. Selective catheterization and angiography of the right renal artery. 5. Sub selective catheterization angiography of right inferior polar and arcuate artery branches. 6. Coil embolization of right inferior pole arcuate artery branch.   MEDICATIONS: None.   ANESTHESIA/SEDATION: Moderate (conscious) sedation was employed during this procedure. A total of Versed 3 mg and Fentanyl 50 mcg was administered intravenously.   Moderate Sedation Time: 66 minutes. The patient's level of consciousness and vital signs were monitored continuously by radiology nursing throughout the procedure under my direct supervision.   CONTRAST:  238mOMNIPAQUE IOHEXOL 350 MG/ML SOLN, 5032mMNIPAQUE IOHEXOL 350 MG/ML SOLN   FLUOROSCOPY TIME:  Fluoroscopy Time: 10.6 minutes, (811 mGy).    COMPLICATIONS: None immediate.   PROCEDURE: Informed consent was obtained from the patient following explanation of the procedure, risks, benefits and alternatives. The patient understands, agrees and consents for the procedure. All questions were addressed. A time out was performed prior to the initiation of the procedure. Maximal barrier sterile technique utilized including caps, mask, sterile gowns, sterile gloves, large sterile drape, hand hygiene, and chlorhexidine prep.   Preprocedure ultrasound evaluation of the right internal jugular vein demonstrated a patent and compressible vein free of internal echoes. Procedure was planned. Subdermal Local anesthesia was provided 1% lidocaine. A small skin nick was made. Under direct ultrasound visualization, a 21 gauge micropuncture needle was directed into the internal jugular vein. An image was captured and stored in the permanent record. A micropuncture set was inserted and exchanged for a J wire which was positioned in the inferior vena cava. Serial dilation was performed followed by placement of a 12.5 French, 24 cm Trialysis catheter. The catheter tip was positioned in the right atrium. Each lumen flushed and aspirated appropriately. The  dialysis ports were locked with appropriate volume of heparin dwell. The middle central venous port was then used for sedation for the remainder of the procedure. The catheter was secured with a 0 silk retention suture. A sterile bandage was applied.   Preprocedure ultrasound evaluation of the right groin was performed which demonstrated a patent right common femoral artery. The procedure was planned. Subdermal Local anesthesia was provided with 1% lidocaine. A small skin nick was made. Under direct ultrasound visualization, a 21 gauge micropuncture needle was directed into the common femoral artery. An ultrasound image was captured and stored in the permanent record. A micro puncture set was inserted and a limited right lower  extremity angiogram was performed which demonstrated appropriate puncture site for closure device use. A J wire was directed to the abdominal aorta and the micropuncture set was exchanged for a 5 [place] vascular sheath. A 5 French C2 catheter was then directed into the right renal ostium. Right renal angiogram was performed. The single main renal artery was patent. 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   A straight lantern microcatheter and 0.014" soft synchro wire was then inserted and directed into the inferior polar branch. Repeat angiogram was performed in the sub selective location which was significant for multifocal 2-4 mm pseudoaneurysm formation, abnormal truncation and irregularity of the arcuate and interlobular branches, an early arteriovenous shunting. The inferior polar arcuate branch was selected further. Coil embolization was performed with multiple low profile Penumbra Ruby coils ranging from 2-3 mm in diameter. Completion right inferior renal angiogram was performed which demonstrated appropriate embolization of the targeted vessels without persistent arteriovenous fistula or vessel irregularity.   The catheters were removed. The right common femoral artery was then closed with a 6 [place] Angio-Seal device. Distal pulses were unchanged. The patient tolerated the procedure well was transferred back to the floor in good condition.   IMPRESSION: 1. 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 2. Sub selective coil embolization of right inferior polar arcuate artery branch. 3. Successful placement of right internal jugular, 24 French Trialysis  catheter with the catheter tip in the right atrium. The catheter is ready for immediate use.   Ruthann Cancer, MD   [facility]   [facility] Radiology     Electronically Signed   By: Ruthann Cancer M.D.   On: 08/31/2021 09:29    IR Fluoro Guide CV Line Right  Result Date: 08/31/2021 INDICATION: 70 year old male with history of acute kidney injury of uncertain etiology status post ultrasound-guided right renal biopsy on 08/28/2021. Since biopsy, the patient has experienced gross hematuria with associated acute anemia. EXAM: 1. Ultrasound-guided vascular access of the right internal jugular vein. 2. Temporary hemodialysis catheter placement. 3. Ultrasound-guided vascular access of the right common femoral artery. 4. Selective catheterization and angiography of the right renal artery. 5. Sub selective catheterization angiography of right inferior polar and arcuate artery branches. 6. Coil embolization of right inferior pole arcuate artery branch. MEDICATIONS: None. ANESTHESIA/SEDATION: Moderate (conscious) sedation was employed during this procedure. A total of Versed 3 mg and Fentanyl 50 mcg was administered intravenously. Moderate Sedation Time: 66 minutes. The patient's level of consciousness and vital signs were monitored continuously by  radiology nursing throughout the procedure under my direct supervision. CONTRAST:  67m OMNIPAQUE IOHEXOL 350 MG/ML SOLN, 532mOMNIPAQUE IOHEXOL 350 MG/ML SOLN FLUOROSCOPY TIME:  Fluoroscopy Time: 10.6 minutes, (811 mGy). COMPLICATIONS: None immediate. PROCEDURE: Informed consent was obtained from the patient following explanation of the procedure, risks, benefits and alternatives. The patient understands, agrees and consents for the procedure. All questions were addressed. A time out was performed prior to the initiation of the procedure. Maximal barrier sterile technique utilized including caps, mask, sterile gowns, sterile gloves, large  sterile drape, hand hygiene, and chlorhexidine prep. Preprocedure ultrasound evaluation of the right internal jugular vein demonstrated a patent and compressible vein free of internal echoes. Procedure was planned. Subdermal Local anesthesia was provided 1% lidocaine. A small skin nick was made. Under direct ultrasound visualization, a 21 gauge micropuncture needle was directed into the internal jugular vein. An image was captured and stored in the permanent record. A micropuncture set was inserted and exchanged for a J wire which was positioned in the inferior vena cava. Serial dilation was performed followed by placement of a 12.5 French, 24 cm Trialysis catheter. The catheter tip was positioned in the right atrium. Each lumen flushed and aspirated appropriately. The dialysis ports were locked with appropriate volume of heparin dwell. The middle central venous port was then used for sedation for the remainder of the procedure. The catheter was secured with a 0 silk retention suture. A sterile bandage was applied. Preprocedure ultrasound evaluation of the right groin was performed which demonstrated a patent right common femoral artery. The procedure was planned. Subdermal Local anesthesia was provided with 1% lidocaine. A small skin nick was made. Under direct ultrasound visualization, a 21 gauge micropuncture needle was directed into the common femoral artery. An ultrasound image was captured and stored in the permanent record. A micro puncture set was inserted and a limited right lower extremity angiogram was performed which demonstrated appropriate puncture site for closure device use. A J wire was directed to the abdominal aorta and the micropuncture set was exchanged for a 5 [place] sheath. A 5 French C2 catheter was then directed into the right renal ostium. Right renal angiogram was performed. The single main renal artery was patent. About 2 arcuate artery branches in the inferior pole there is  abnormal truncation and vessel irregularity with evidence of an early filling arteriovenous fistula in addition to suggestion of faint filling into the collecting system on delayed imaging. A straight lantern microcatheter and 0.014" soft synchro wire was then inserted and directed into the inferior polar branch. Repeat angiogram was performed in the sub selective location which was significant for multifocal 2-4 mm pseudoaneurysm formation, abnormal truncation and irregularity of the arcuate and interlobular branches, an early arteriovenous shunting. The inferior polar arcuate branch was selected further. Coil embolization was performed with multiple low profile Penumbra Ruby coils ranging from 2-3 mm in diameter. Completion right inferior renal angiogram was performed which demonstrated appropriate embolization of the targeted vessels without persistent arteriovenous fistula or vessel irregularity. The catheters were removed. The right common femoral artery was then closed with a 6 [place]-Seal device. Distal pulses were unchanged. The patient tolerated the procedure well was transferred back to the floor in good condition. IMPRESSION: 1. 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 2. Sub selective coil embolization of right inferior polar arcuate artery branch. 3. Successful placement of right  internal jugular, 24 [place] Trialysis catheter with the catheter tip in the right atrium. The catheter is ready for immediate use. Ruthann Cancer, MD [facility] Radiology Electronically Signed   By: Ruthann Cancer M.D.   On: 08/31/2021 09:29   IR US Guide Vasc Access Right  Result Date: 08/31/2021 INDICATION: 70 year old male with history of acute kidney injury of uncertain etiology status post ultrasound-guided right renal  biopsy on 08/28/2021. Since biopsy, the patient has experienced gross hematuria with associated acute anemia. EXAM: 1. Ultrasound-guided vascular access of the right internal jugular vein. 2. Temporary hemodialysis catheter placement. 3. Ultrasound-guided vascular access of the right common femoral artery. 4. Selective catheterization and angiography of the right renal artery. 5. Sub selective catheterization angiography of right inferior polar and arcuate artery branches. 6. Coil embolization of right inferior pole arcuate artery branch. MEDICATIONS: None. ANESTHESIA/SEDATION: Moderate (conscious) sedation was employed during this procedure. A total of Versed 3 mg and Fentanyl 50 mcg was administered intravenously. Moderate Sedation Time: 66 minutes. The patient's level of consciousness and vital signs were monitored continuously by radiology nursing throughout the procedure under my direct supervision. CONTRAST:  56m OMNIPAQUE IOHEXOL 350 MG/ML SOLN, 564mOMNIPAQUE IOHEXOL 350 MG/ML SOLN FLUOROSCOPY TIME:  Fluoroscopy Time: 10.6 minutes, (811 mGy). COMPLICATIONS: None immediate. PROCEDURE: Informed consent was obtained from the patient following explanation of the procedure, risks, benefits and alternatives. The patient understands, agrees and consents for the procedure. All questions were addressed. A time out was performed prior to the initiation of the procedure. Maximal barrier sterile technique utilized including caps, mask, sterile gowns, sterile gloves, large sterile drape, hand hygiene, and chlorhexidine prep. Preprocedure ultrasound evaluation of the right internal jugular vein demonstrated a patent and compressible vein free of internal echoes. Procedure was planned. Subdermal Local anesthesia was provided 1% lidocaine. A small skin nick was made. Under direct ultrasound visualization, a 21 gauge micropuncture needle was directed into the internal jugular vein. An image was captured and stored in the  permanent record. A micropuncture set was inserted and exchanged for a J wire which was positioned in the inferior vena cava. Serial dilation was performed followed by placement of a 12.5 French, 24 cm Trialysis catheter. The catheter tip was positioned in the right atrium. Each lumen flushed and aspirated appropriately. The dialysis ports were locked with appropriate volume of heparin dwell. The middle central venous port was then used for sedation for the remainder of the procedure. The catheter was secured with a 0 silk retention suture. A sterile bandage was applied. Preprocedure ultrasound evaluation of the right groin was performed which demonstrated a patent right common femoral artery. The procedure was planned. Subdermal Local anesthesia was provided with 1% lidocaine. A small skin nick was made. Under direct ultrasound visualization, a 21 gauge micropuncture needle was directed into the common femoral artery. An ultrasound image was captured and stored in the permanent record. A micro puncture set was inserted and a limited right lower extremity angiogram was performed which demonstrated appropriate puncture site for closure device use. A J wire was directed to the abdominal aorta and the micropuncture set was exchanged for a 5 [place] sheath. A 5 French C2 catheter was then directed into the right renal ostium. Right renal angiogram was performed. The single main renal artery was patent. About 2 arcuate artery branches in the inferior pole there is abnormal truncation and vessel irregularity with evidence of an early filling arteriovenous fistula in addition to suggestion  of faint filling into the collecting system on delayed imaging. A straight lantern microcatheter and 0.014" soft synchro wire was then inserted and directed into the inferior polar branch. Repeat angiogram was performed in the sub selective location which was significant for multifocal 2-4 mm pseudoaneurysm formation, abnormal  truncation and irregularity of the arcuate and interlobular branches, an early arteriovenous shunting. The inferior polar arcuate branch was selected further. Coil embolization was performed with multiple low profile Penumbra Ruby coils ranging from 2-3 mm in diameter. Completion right inferior renal angiogram was performed which demonstrated appropriate embolization of the targeted vessels without persistent arteriovenous fistula or vessel irregularity. The catheters were removed. The right common femoral artery was then closed with a 6 [place] Angio-Seal device. Distal pulses were unchanged. The patient tolerated the procedure well was transferred back to the floor in good condition. IMPRESSION: 1. 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 2. Sub selective coil embolization of right inferior polar arcuate artery branch. 3. Successful placement of right internal jugular, 24 French Trialysis catheter with the catheter tip in the right atrium. The catheter is ready for immediate use. Ruthann Cancer, MD [facility] Electronically Signed   By: Ruthann Cancer M.D.   On: 08/31/2021 09:29   IR US Guide Vasc Access Right  Result Date: 08/31/2021 INDICATION: 70 year old male with history of acute kidney injury of uncertain etiology status post ultrasound-guided right renal biopsy on 08/28/2021. Since biopsy, the patient has experienced gross hematuria with associated acute anemia. EXAM: 1. Ultrasound-guided vascular access of the right internal jugular vein. 2. Temporary hemodialysis catheter placement. 3. Ultrasound-guided vascular access of the right common femoral artery. 4. Selective catheterization and angiography of the right renal artery. 5. Sub selective catheterization angiography of right inferior polar and  arcuate artery branches. 6. Coil embolization of right inferior pole arcuate artery branch. MEDICATIONS: None. ANESTHESIA/SEDATION: Moderate (conscious) sedation was employed during this procedure. A total of Versed 3 mg and Fentanyl 50 mcg was administered intravenously. Moderate Sedation Time: 66 minutes. The patient's level of consciousness and vital signs were monitored continuously by radiology nursing throughout the procedure under my direct supervision. CONTRAST:  52m OMNIPAQUE IOHEXOL 350 MG/ML SOLN, 569mOMNIPAQUE IOHEXOL 350 MG/ML SOLN FLUOROSCOPY TIME:  Fluoroscopy Time: 10.6 minutes, (811 mGy). COMPLICATIONS: None immediate. PROCEDURE: Informed consent was obtained from the patient following explanation of the procedure, risks, benefits and alternatives. The patient understands, agrees and consents for the procedure. All questions were addressed. A time out was performed prior to the initiation of the procedure. Maximal barrier sterile technique utilized including caps, mask, sterile gowns, sterile gloves, large sterile drape, hand hygiene, and chlorhexidine prep. Preprocedure ultrasound evaluation of the right internal jugular vein demonstrated a patent and compressible vein free of internal echoes. Procedure was planned. Subdermal Local anesthesia was provided 1% lidocaine. A small skin nick was made. Under direct ultrasound visualization, a 21 gauge micropuncture needle was directed into the internal jugular vein. An image was captured and stored in the permanent record. A micropuncture set was inserted and exchanged for a J wire which was positioned in the inferior vena cava. Serial dilation was performed followed by placement of a 12.5 French, 24 cm Trialysis catheter. The catheter tip was positioned in the right atrium. Each lumen flushed and aspirated appropriately. The dialysis ports were locked with appropriate volume of heparin dwell. The middle  central venous port was then used for sedation  for the remainder of the procedure. The catheter was secured with a 0 silk retention suture. A sterile bandage was applied. Preprocedure ultrasound evaluation of the right groin was performed which demonstrated a patent right common femoral artery. The procedure was planned. Subdermal Local anesthesia was provided with 1% lidocaine. A small skin nick was made. Under direct ultrasound visualization, a 21 gauge micropuncture needle was directed into the common femoral artery. An ultrasound image was captured and stored in the permanent record. A micro puncture set was inserted and a limited right lower extremity angiogram was performed which demonstrated appropriate puncture site for closure device use. A J wire was directed to the abdominal aorta and the micropuncture set was exchanged for a 5 [place] vascular sheath. A 5 French C2 catheter was then directed into the right renal ostium. Right renal angiogram was performed. The single main renal artery was patent. 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 A straight lantern microcatheter and 0.014" soft synchro wire was then inserted and directed into the inferior polar branch. Repeat angiogram was performed in the sub selective location which was significant for multifocal 2-4 mm pseudoaneurysm formation, abnormal truncation and irregularity of the arcuate and interlobular branches, an early arteriovenous shunting. The inferior polar arcuate branch was selected further. Coil embolization was performed with multiple low profile Penumbra Ruby coils ranging from 2-3 mm in diameter. Completion right inferior renal angiogram was performed which demonstrated appropriate embolization of the targeted vessels without persistent arteriovenous fistula or vessel  irregularity. The catheters were removed. The right common femoral artery was then closed with a 6 [place] Angio-Seal device. Distal pulses were unchanged. The patient tolerated the procedure well was transferred back to the floor in good condition. IMPRESSION: 1. 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 2. Sub selective coil embolization of right inferior polar arcuate artery branch. 3. Successful placement of right internal jugular, 24 French Trialysis catheter with the catheter tip in the right atrium. The catheter is ready for immediate use. Ruthann Cancer, MD [facility] Radiology Electronically Signed   By: Ruthann Cancer M.D.   On: 08/31/2021 09:29   DG Chest Port 1 View  Result Date: 09/03/2021 CLINICAL DATA:  Shortness of breath EXAM: PORTABLE CHEST 1 VIEW COMPARISON:  08/23/2021 FINDINGS: Right dialysis catheter in place with the tip in the right atrium. Heart is normal size. Mild vascular congestion and interstitial prominence throughout the lungs, likely mild edema. No effusions or acute bony abnormality. IMPRESSION: Suspect mild pulmonary edema. Electronically Signed   By: Rolm Baptise M.D.   On: 09/03/2021 09:40   ECHOCARDIOGRAM COMPLETE  Result Date: 08/24/2021    ECHOCARDIOGRAM REPORT   Patient Name:   CARA AGUINO Date of Exam: 08/24/2021 Medical Rec #:  [id number]    Height:       70.0 in Accession #:    [id number]   Weight:       180.0 lb Date of Birth:  1950-12-30     BSA:          1.996 m Patient Age:    28 years     BP:  120/69 mmHg Patient Gender: M            HR:           85 bpm. Exam Location:  Inpatient Procedure: 2D Echo, Cardiac Doppler and Color Doppler Indications:    Abnormal EKG  History:        Patient has no prior history of Echocardiogram examinations.                 COPD,  Signs/Symptoms:Dyspnea and Weakness, renal disease,                 anemia, elevated troponin; Risk Factors:Current Smoker.  Sonographer:    Dustin Flock RDCS Referring Phys: [place]  Sonographer Comments: Image acquisition challenging due to COPD. IMPRESSIONS  1. Left ventricular ejection fraction, by estimation, is 55 to 60%. The left ventricle has normal function. The left ventricle has no regional wall motion abnormalities. There is mild concentric left ventricular hypertrophy. Left ventricular diastolic parameters are consistent with Grade I diastolic dysfunction (impaired relaxation).  2. Right ventricular systolic function is normal. The right ventricular size is normal. Tricuspid regurgitation signal is inadequate for assessing PA pressure.  3. The mitral valve is grossly normal. No evidence of mitral valve regurgitation. No evidence of mitral stenosis.  4. The aortic valve was not well visualized. Aortic valve regurgitation is not visualized. No aortic stenosis is present.  5. The inferior vena cava is normal in size with greater than 50% respiratory variability, suggesting right atrial pressure of 3 mmHg. Comparison(s): No prior Echocardiogram. FINDINGS  Left Ventricle: Left ventricular ejection fraction, by estimation, is 55 to 60%. The left ventricle has normal function. The left ventricle has no regional wall motion abnormalities. The left ventricular internal cavity size was normal in size. There is  mild concentric left ventricular hypertrophy. Left ventricular diastolic parameters are consistent with Grade I diastolic dysfunction (impaired relaxation). Right Ventricle: The right ventricular size is normal. No increase in right ventricular wall thickness. Right ventricular systolic function is normal. Tricuspid regurgitation signal is inadequate for assessing PA pressure. Left Atrium: Left atrial size was normal in size. Right Atrium: Right atrial size was normal in size.  Pericardium: There is no evidence of pericardial effusion. Mitral Valve: The mitral valve is grossly normal. No evidence of mitral valve regurgitation. No evidence of mitral valve stenosis. Tricuspid Valve: The tricuspid valve is normal in structure. Tricuspid valve regurgitation is not demonstrated. No evidence of tricuspid stenosis. Aortic Valve: The aortic valve was not well visualized. Aortic valve regurgitation is not visualized. No aortic stenosis is present. Pulmonic Valve: The pulmonic valve was not well visualized. Pulmonic valve regurgitation is not visualized. Aorta: The aortic root is normal in size and structure. Venous: The inferior vena cava is normal in size with greater than 50% respiratory variability, suggesting right atrial pressure of 3 mmHg. IAS/Shunts: The atrial septum is grossly normal.  LEFT VENTRICLE PLAX 2D LVIDd:         5.80 cm      Diastology LVIDs:         3.60 cm      LV e' medial:    5.55 cm/s LV PW:         1.30 cm      LV E/e' medial:  14.5 LV IVS:        1.30 cm      LV e' lateral:   6.74 cm/s LVOT diam:     2.40 cm  LV E/e' lateral: 12.0 LV SV:         76 LV SV Index:   38 LVOT Area:     4.52 cm  LV Volumes (MOD) LV vol d, MOD A4C: 124.0 ml LV vol s, MOD A4C: 47.8 ml LV SV MOD A4C:     124.0 ml RIGHT VENTRICLE RV Basal diam:  2.80 cm RV S prime:     10.10 cm/s TAPSE (M-mode): 2.9 cm LEFT ATRIUM             Index       RIGHT ATRIUM           Index LA diam:        3.80 cm 1.90 cm/m  RA Area:     13.10 cm LA Vol (A2C):   31.8 ml 15.93 ml/m RA Volume:   29.70 ml  14.88 ml/m LA Vol (A4C):   33.0 ml 16.53 ml/m LA Biplane Vol: 34.9 ml 17.49 ml/m  AORTIC VALVE LVOT Vmax:   98.30 cm/s LVOT Vmean:  60.300 cm/s LVOT VTI:    0.169 m  AORTA Ao Root diam: 3.30 cm MITRAL VALVE MV Area (PHT): 3.85 cm    SHUNTS MV Decel Time: 197 msec    Systemic VTI:  0.17 m MV E velocity: 80.60 cm/s  Systemic Diam: 2.40 cm MV A velocity: 49.30 cm/s MV E/A ratio:  1.63 Rudean Haskell MD  Electronically signed by Rudean Haskell MD Signature Date/Time: 08/24/2021/11:38:40 AM    Final    CT Renal Stone Study  Result Date: 08/23/2021 CLINICAL DATA:  Hematuria, new renal failure EXAM: CT ABDOMEN AND PELVIS WITHOUT CONTRAST TECHNIQUE: Multidetector CT imaging of the abdomen and pelvis was performed following the standard protocol without IV contrast. COMPARISON:  06/26/2007 FINDINGS: Lower chest: There is mild, scattered, nonspecific ground-glass and fine nodularity throughout the included bilateral lung bases (series 5, 4). Hepatobiliary: No solid liver abnormality is seen. No gallstones, gallbladder wall thickening, or biliary dilatation. Pancreas: Unremarkable. No pancreatic ductal dilatation or surrounding inflammatory changes. Spleen: Normal in size without significant abnormality. Adrenals/Urinary Tract: Adrenal glands are unremarkable. Kidneys are normal, without renal calculi, solid lesion, or hydronephrosis. Distended urinary bladder, measuring at least 17.4 cm. Stomach/Bowel: Stomach is within normal limits. Appendix not clearly visualized and may be surgically. No evidence of bowel wall thickening, distention, or inflammatory changes. Descending and sigmoid diverticulosis. Vascular/Lymphatic: Aortic atherosclerosis. No enlarged abdominal or pelvic lymph nodes. Reproductive: No mass or other significant abnormality. Other: Small, fat containing bilateral inguinal hernias no abdominopelvic ascites. Musculoskeletal: No acute or significant osseous findings. IMPRESSION: 1. No evidence of urinary tract calculus or hydronephrosis. 2. Distended urinary bladder, measuring at least 17.4 cm. Correlate for urinary retention. 3. Descending and sigmoid diverticulosis without evidence of acute diverticulitis. 4. Mild, nonspecific scattered ground-glass and fine nodularity throughout the bilateral lung bases, possibly infectious or inflammatory. Aortic Atherosclerosis (ICD10-I70.0). Electronically  Signed   By: Eddie Candle M.D.   On: 08/23/2021 16:04   US BIOPSY (KIDNEY)  Result Date: 08/28/2021 INDICATION: Acute kidney injury of uncertain etiology. Please perform image guided biopsy for tissue diagnostic purposes. EXAM: ULTRASOUND GUIDED RENAL BIOPSY COMPARISON:  CT abdomen and pelvis-08/23/2021 MEDICATIONS: None. ANESTHESIA/SEDATION: Fentanyl 100 mcg IV; Versed 2 mg IV Total Moderate Sedation time: 14 minutes; The patient was continuously monitored during the procedure by the interventional radiology nurse under my direct supervision. COMPLICATIONS: None immediate. PROCEDURE: Informed written consent was obtained from the patient after a discussion of the risks, benefits  and alternatives to treatment. The patient understands and consents the procedure. A timeout was performed prior to the initiation of the procedure. Ultrasound scanning was performed of the bilateral flanks. The inferior pole of the right kidney was selected for biopsy due to location and sonographic window. The procedure was planned. The operative site was prepped and draped in the usual sterile fashion. The overlying soft tissues were anesthetized with 1% lidocaine with epinephrine. A 17 gauge core needle biopsy device was advanced into the inferior cortex of the right kidney and 3 core biopsies were obtained under direct ultrasound guidance. Images were saved for documentation purposes. The biopsy device was removed and hemostasis was obtained with manual compression. Post procedural scanning was negative for significant post procedural hemorrhage or additional complication. A dressing was placed. The patient tolerated the procedure well without immediate post procedural complication. IMPRESSION: Technically successful ultrasound guided right renal biopsy. Electronically Signed   By: Sandi Mariscal M.D.   On: 08/28/2021 12:52   IR EMBO ART  VEN HEMORR LYMPH EXTRAV  INC GUIDE ROADMAPPING  Result Date: 08/31/2021 INDICATION: 70 year old  male with history of acute kidney injury of uncertain etiology status post ultrasound-guided right renal biopsy on 08/28/2021. Since biopsy, the patient has experienced gross hematuria with associated acute anemia. EXAM: 1. Ultrasound-guided vascular access of the right internal jugular vein. 2. Temporary hemodialysis catheter placement. 3. Ultrasound-guided vascular access of the right common femoral artery. 4. Selective catheterization and angiography of the right renal artery. 5. Sub selective catheterization angiography of right inferior polar and arcuate artery branches. 6. Coil embolization of right inferior pole arcuate artery branch. MEDICATIONS: None. ANESTHESIA/SEDATION: Moderate (conscious) sedation was employed during this procedure. A total of Versed 3 mg and Fentanyl 50 mcg was administered intravenously. Moderate Sedation Time: 66 minutes. The patient's level of consciousness and vital signs were monitored continuously by radiology nursing throughout the procedure under my direct supervision. CONTRAST:  10m OMNIPAQUE IOHEXOL 350 MG/ML SOLN, 579mOMNIPAQUE IOHEXOL 350 MG/ML SOLN FLUOROSCOPY TIME:  Fluoroscopy Time: 10.6 minutes, (811 mGy). COMPLICATIONS: None immediate. PROCEDURE: Informed consent was obtained from the patient following explanation of the procedure, risks, benefits and alternatives. The patient understands, agrees and consents for the procedure. All questions were addressed. A time out was performed prior to the initiation of the procedure. Maximal barrier sterile technique utilized including caps, mask, sterile gowns, sterile gloves, large sterile drape, hand hygiene, and chlorhexidine prep. Preprocedure ultrasound evaluation of the right internal jugular vein demonstrated a patent and compressible vein free of internal echoes. Procedure was planned. Subdermal Local anesthesia was provided 1% lidocaine. A small skin nick was made. Under direct ultrasound visualization, a 21 gauge  micropuncture needle was directed into the internal jugular vein. An image was captured and stored in the permanent record. A micropuncture set was inserted and exchanged for a J wire which was positioned in the inferior vena cava. Serial dilation was performed followed by placement of a 12.5 French, 24 cm Trialysis catheter. The catheter tip was positioned in the right atrium. Each lumen flushed and aspirated appropriately. The dialysis ports were locked with appropriate volume of heparin dwell. The middle central venous port was then used for sedation for the remainder of the procedure. The catheter was secured with a 0 silk retention suture. A sterile bandage was applied. Preprocedure ultrasound evaluation of the right groin was performed which demonstrated a patent right common femoral artery. The procedure was planned. Subdermal Local anesthesia was provided with 1% lidocaine. A  small skin nick was made. Under direct ultrasound visualization, a 21 gauge micropuncture needle was directed into the common femoral artery. An ultrasound image was captured and stored in the permanent record. A micro puncture set was inserted and a limited right lower extremity angiogram was performed which demonstrated appropriate puncture site for closure device use. A J wire was directed to the abdominal aorta and the micropuncture set was exchanged for a 5 [place] vascular sheath. A 5 French C2 catheter was then directed into the right renal ostium. Right renal angiogram was performed. The single main renal artery was patent. 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 A straight lantern microcatheter and 0.014" soft synchro wire was then inserted and directed into the inferior polar branch. Repeat angiogram was performed in  the sub selective location which was significant for multifocal 2-4 mm pseudoaneurysm formation, abnormal truncation and irregularity of the arcuate and interlobular branches, an early arteriovenous shunting. The inferior polar arcuate branch was selected further. Coil embolization was performed with multiple low profile Penumbra Ruby coils ranging from 2-3 mm in diameter. Completion right inferior renal angiogram was performed which demonstrated appropriate embolization of the targeted vessels without persistent arteriovenous fistula or vessel irregularity. The catheters were removed. The right common femoral artery was then closed with a 6 [place] Angio-Seal device. Distal pulses were unchanged. The patient tolerated the procedure well was transferred back to the floor in good condition. IMPRESSION: 1. 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 2. Sub selective coil embolization of right inferior polar arcuate artery branch. 3. Successful placement of right internal jugular, 24 French Trialysis catheter with the catheter tip in the right atrium. The catheter is ready for immediate use. Ruthann Cancer, MD [facility] Radiology Electronically Signed   By: Ruthann Cancer M.D.   On: 08/31/2021 09:29    Assessment and Plan:  1.  AL amyloidosis 2.  Anemia secondary to renal insufficiency, iron deficiency, and folate deficiency 3.  Acute kidney injury 4.  History of nephrolithiasis  -Preliminary biopsy results discussed with the patient and his daughter-in-law.  Recommend additional work-up including serum light chains and immunoglobulins. -Also recommend additional work-up including a bone marrow biopsy by interventional radiology.  The patient agrees to proceed.  Orders have been entered. -We briefly discussed treatment  options including chemotherapy once additional work-up has been completed.  Further discussion pending above results. -The patient has multifactorial anemia.  He is receiving folic acid 2 mg daily and recommend for this to be continued.  Status post ferric gluconate 250 mg IV x4 doses.  Recommend PRBC transfusion for hemoglobin less than 7.5. -Nephrology following for AKI.  Thank you for this referral.   Mikey Bussing, DNP, AGPCNP-BC, AOCNP    Addendum I have reviewed the above documentation for accuracy and completeness, and I agree with the above.  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  He also has moderate anemia, which is related to his amyloidosis and bleeding from renal biopsy.  I discussed AL amyloidosis is a systematic disease secondary to excessive monoclonal light chain production, and it can deposit in organs and tissue and cause organ dysfunction.  I recommend  a bone marrow biopsy, and echocardiogram to rule out cardiac amyloidosis. Will check his serum and urine light chain levels.  I discussed treatment options, and recommend first-line oral cytoxan, Velcade injection and dexamethasone. May add Dara infusion or injection as out pt. Benefit and potential side effects discussed with patient and his daughter-in-law.  Patient lives in [place], and would like to start treatment here.  His only son and his family lives in [place], they will help and would like him to live with him if needed. I may start him the first dose of Velcade in the hospital before discharge after BM biopsy. We will f/u.   Truitt Merle  09/03/2021

## 2021-09-03 NOTE — Progress Notes (Signed)
Received preliminary kidney biopsy report: AL amyloidosis, lambda light chain composition (glomeruli, interstitium, arteries, and arterioles). Marked IF, 59% global glomerular sclerosis and marked arteriosclerosis. Final read/EM pending.  Discussed this at length with patient and daughter in law (provided copy of report to them). Prelim report uploaded to Media tab.  Case discussed w/ oncology, Dr. Burr Medico who will see the patient in consultation. Appreciate assistance from oncology.  Gean Quint, MD [facility]

## 2021-09-04 DIAGNOSIS — E854 Organ-limited amyloidosis: Secondary | ICD-10-CM

## 2021-09-04 DIAGNOSIS — D649 Anemia, unspecified: Secondary | ICD-10-CM | POA: Diagnosis not present

## 2021-09-04 DIAGNOSIS — N179 Acute kidney failure, unspecified: Secondary | ICD-10-CM | POA: Diagnosis not present

## 2021-09-04 DIAGNOSIS — N08 Glomerular disorders in diseases classified elsewhere: Secondary | ICD-10-CM | POA: Diagnosis not present

## 2021-09-04 LAB — RENAL FUNCTION PANEL
Albumin: 1.8 g/dL — ABNORMAL LOW (ref 3.5–5.0)
Anion gap: 10 (ref 5–15)
BUN: 37 mg/dL — ABNORMAL HIGH (ref 8–23)
CO2: 27 mmol/L (ref 22–32)
Calcium: 8.2 mg/dL — ABNORMAL LOW (ref 8.9–10.3)
Chloride: 101 mmol/L (ref 98–111)
Creatinine, Ser: 5.77 mg/dL — ABNORMAL HIGH (ref 0.61–1.24)
GFR, Estimated: 10 mL/min — ABNORMAL LOW (ref >60)
Glucose, Bld: 106 mg/dL — ABNORMAL HIGH (ref 70–99)
Phosphorus: 4.8 mg/dL — ABNORMAL HIGH (ref 2.5–4.6)
Potassium: 3.4 mmol/L — ABNORMAL LOW (ref 3.5–5.1)
Sodium: 138 mmol/L (ref 135–145)

## 2021-09-04 LAB — KAPPA/LAMBDA LIGHT CHAINS
Kappa free light chain: 93.4 mg/L — ABNORMAL HIGH (ref 3.3–19.4)
Kappa, lambda light chain ratio: 0.02 — ABNORMAL LOW (ref 0.26–1.65)
Lambda free light chains: 3991 mg/L — ABNORMAL HIGH (ref 5.7–26.3)

## 2021-09-04 LAB — CBC
HCT: 30 % — ABNORMAL LOW (ref 39.0–52.0)
Hemoglobin: 9.5 g/dL — ABNORMAL LOW (ref 13.0–17.0)
MCH: 26.8 pg (ref 26.0–34.0)
MCHC: 31.7 g/dL (ref 30.0–36.0)
MCV: 84.5 fL (ref 80.0–100.0)
Platelets: 321 10*3/uL (ref 150–400)
RBC: 3.55 MIL/uL — ABNORMAL LOW (ref 4.22–5.81)
RDW: 17.4 % — ABNORMAL HIGH (ref 11.5–15.5)
WBC: 8 10*3/uL (ref 4.0–10.5)
nRBC: 0 % (ref 0.0–0.2)

## 2021-09-04 LAB — LACTATE DEHYDROGENASE: LDH: 170 U/L (ref 98–192)

## 2021-09-04 MED ORDER — POTASSIUM CHLORIDE CRYS ER 20 MEQ PO TBCR
20.0000 meq | EXTENDED_RELEASE_TABLET | Freq: Once | ORAL | Status: AC
  Administered 2021-09-04: 20 meq via ORAL
  Filled 2021-09-04: qty 1

## 2021-09-04 MED ORDER — POTASSIUM CHLORIDE CRYS ER 20 MEQ PO TBCR
40.0000 meq | EXTENDED_RELEASE_TABLET | Freq: Once | ORAL | Status: AC
  Administered 2021-09-04: 40 meq via ORAL
  Filled 2021-09-04: qty 2

## 2021-09-04 MED ORDER — OXYCODONE HCL 5 MG PO TABS
2.5000 mg | ORAL_TABLET | Freq: Four times a day (QID) | ORAL | Status: DC | PRN
Start: 2021-09-04 — End: 2021-09-05
  Administered 2021-09-04 – 2021-09-05 (×2): 2.5 mg via ORAL
  Filled 2021-09-04 (×2): qty 1

## 2021-09-04 NOTE — Progress Notes (Signed)
PROGRESS NOTE        PATIENT DETAILS Name: Caleb Simmons Age: 70 y.o. Sex: male Date of Birth: February 15, 1951 Admit Date: 08/23/2021 Admitting Physician Costin Karlyne Greenspan, MD PCP:Pcp, No  Brief Narrative:  Patient is a 70 y.o. male with history of nephrolithiasis-presenting with generalized weakness/shortness of breath-found to have AKI and severe normocytic anemia.  Patient was evaluated by nephrology-with concerns that patient's AKI was related to a glomerular pathology-subsequently underwent right renal biopsy on 9/6-unfortunately postrenal biopsy-patient developed bleeding into the renal collecting system-with clot retention requiring insertion of three-way catheter and CBI.  Patient continued to have renal bleeding-on 9/8-IR performed embolization.    See below for further details.  Significant events: 9/1>> admit for evaluation of AKI/severe anemia. 9/6>> ultrasound-guided biopsy of right kidney 9/6>> developed hematuria followed by acute urinary retention-presyncope and hypotension post kidney biopsy.  Three-way Foley catheter placed and CBI started. 9/8>> hematuria-hemoglobin down to 6.4-IR performed embolization and TDC placement. 9/10>> received 2 units PRBC for hemoglobin of 6.6 and due to hematuria 9/12>> renal biopsy results back with amyloidosis.  Significant studies: 9/1>> CXR: No pneumonia 9/1>> VQ scan: No PE 9/1>> CT renal stone study: No hydronephrosis, distended bladder.  Diverticulosis. 9/1>> FOBT: Negative 9/1>> UA: Protein>> 300, RBCs 11-20/hpf 9/1>> HIV: Nonreactive 9/1>> HBsAg/HCV Ab: Nonreactive 9/1>> C3/C4: Normal limits 9/1>> glomerular basement Ab: Negative 9/1>> ANA: Negative 9/1>> dsDNA: Negative 9/1>> ANCA titers: Negative 9/1>> SPEP: no M  spike 9/2>> Echo: EF 38-93%, grade 1 diastolic dysfunction. 9/4>>Folic acid :7.3(SKA) 7/6>>OTL B12:298 9/6>> CT abdomen/pelvis: Tiny perinephric hematoma, bleeding into the right renal  collecting system 9/8>> CT abdomen/pelvis: Substantial clot burden within the bladder-small perinephric hematoma   Antimicrobial therapy: None  Microbiology data: 9/1>> COVID PCR: Negative  Procedures : 9/06>> ultrasound-guided biopsy of right kidney 9/8>> 1) Right IJ temporary hemodialysis catheter placement 2) Right renal angiogram 3) Selective catheterization and embolization of right inferior pole arcuate artery  Consults: Nephrology, IR, urology, oncology  DVT Prophylaxis : Place and maintain sequential compression device Start: 09/01/21 1335 Place and maintain sequential compression device Start: 08/24/21 0519   Subjective:  Reports dyspnea has improved, still reports some anxiety, reports dyspnea is related to receiving Dilaudid and requesting different pain medication.  .  Assessment/Plan:  Acute kidney injury:  -Renal biopsy by IR 9/6, complicated by postbiopsy bleeding.   -Work-up significant for amyloidosis -  He had right IJ temporary HD catheter placed on 9/8 in IR during embolization of the bleeding artery  Intake/Output Summary (Last 24 hours) at 09/04/2021 1508 Last data filed at 09/04/2021 1506 Gross per 24 hour  Intake 840 ml  Output 3755 ml  Net -2915 ml     AL amyloidosis -Oncology input greatly appreciated, commendation for bone marrow biopsy, which will be done tomorrow. -Renal likely will start Cytoxan, Velcade and dexamethasone, likely will receive first dose of Velcade for discharge after BM biopsy.   Hypotension/presyncope-hematuria-acute urinary retention (clot retention) due to bleeding into the collecting system of the right kidney post renal biopsy on 9/6-requiring three-way Foley catheter insertion and CBI and embolization by IR on 9/8 :  -Management per urology, Foley catheter discontinued 9/8, he does remain with some hematuria, but still passing urine, will continue to monitor for signs of retention closely , continue to monitor with  bladder scan . -Trend CBC closely and transfuse as  needed.      Microcytic anemia/acute blood loss anemia due to hematuria -Multifactorial anemia-from AKI/acute illness/vitamin B12 and folate deficiency -He did require multiple transfusions  Minimally elevated troponins: Trend is flat-not consistent with ACS-he does not have any chest pain.  Exertional dyspnea was from symptomatic anemia.  Suspect elevated troponin is in the setting of kidney failure.  Echo without any wall motion malady and preserved EF.  Doubt any further work-up is required-especially in light of severity of anemia and AKI.  Anxiety: Continue Xanax while he is in the hospital-is aware that we will likely not continue Xanax post discharge.  Use trazodone for sleep.  Patient is not interested in starting long-acting medications like SSRI for his anxiety-thinks that once he gets home-he will not require any further medications for his anxiety issues.  Tobacco abuse: Continue transdermal nicotine. Diet: Diet Order             Diet NPO time specified  Diet effective midnight           Diet regular Room service appropriate? Yes; Fluid consistency: Thin  Diet effective now                    Code Status: Full code   Family Communication: discussed with daughter-in-law at bedside 9/12, discussed with son by phone 9/13.  DVT prophylaxis: ED, he was encouraged to ambulate as much as possible, no chemical prophylaxis given significant hematuria.  Disposition Plan: Status is: Inpatient  Remains inpatient appropriate because:Inpatient level of care appropriate due to severity of illness  Dispo: The patient is from: Home              Anticipated d/c is to: Home              Patient currently is not medically stable to d/c.   Difficult to place patient No       Antimicrobial agents: Anti-infectives (From admission, onward)    None        MEDICATIONS: Scheduled Meds:  Chlorhexidine Gluconate Cloth  6  each Topical Daily   feeding supplement (NEPRO CARB STEADY)  237 mL Oral TID BM   folic acid  2 mg Oral Daily   melatonin  5 mg Oral QHS   nicotine  14 mg Transdermal Q24H   pantoprazole  40 mg Oral BID AC   sodium bicarbonate  1,300 mg Oral TID   tamsulosin  0.4 mg Oral Daily   Continuous Infusions:  sodium chloride     sodium chloride 10 mL/hr at 08/27/21 2147   sodium chloride irrigation     PRN Meds:.acetaminophen, ALPRAZolam, bisacodyl, diphenhydrAMINE, heparin sodium (porcine), HYDROmorphone (DILAUDID) injection, lidocaine, midazolam, midazolam, ondansetron **OR** ondansetron (ZOFRAN) IV, promethazine, technetium albumin aggregated, traMADol, traZODone   PHYSICAL EXAM: Vital signs: Vitals:   09/04/21 0052 09/04/21 0520 09/04/21 0834 09/04/21 1241  BP: 133/89 139/81 134/81 (!) 144/80  Pulse: 76  72 68  Resp: 13 20 18 18   Temp: 98.1 F (36.7 C) 98.1 F (36.7 C) (!) 97.5 F (36.4 C) 97.9 F (36.6 C)  TempSrc: Oral Oral Oral Oral  SpO2: 96% 92% 95% 93%  Weight:      Height:       Filed Weights   08/23/21 0816  Weight: 81.6 kg   Body mass index is 25.83 kg/m.   Awake Alert, Oriented X 3, No new F.N deficits, Normal affect Symmetrical Chest wall movement, Good air movement bilaterally, CTAB RRR,No Gallops,Rubs or new  Murmurs, No Parasternal Heave +ve B.Sounds, Abd Soft, No tenderness, No rebound - guarding or rigidity. No Cyanosis, Clubbing or edema, No new Rash or bruise       I have personally reviewed following labs and imaging studies  LABORATORY DATA: CBC: Recent Labs  Lab 08/31/21 0230 08/31/21 1529 09/01/21 0951 09/02/21 0151 09/03/21 0214 09/04/21 0818  WBC 11.2* 11.5* 11.3*  --  8.8 8.0  HGB 7.1* 7.6* 6.6* 8.4* 8.7* 9.5*  HCT 22.5* 24.5* 21.4* 26.3* 27.1* 30.0*  MCV 84.3 85.1 86.3  --  85.0 84.5  PLT 226 246 246  --  278 010    Basic Metabolic Panel: Recent Labs  Lab 08/31/21 0230 09/01/21 0212 09/02/21 0127 09/03/21 0214  09/04/21 0256  NA 139 138 137 138 138  K 4.1 3.9 3.9 3.5 3.4*  CL 106 104 103 105 101  CO2 23 23 24 22 27   GLUCOSE 106* 103* 98 85 106*  BUN 34* 36* 37* 36* 37*  CREATININE 5.93* 5.98* 5.82* 5.74* 5.77*  CALCIUM 8.3* 8.2* 8.1* 8.1* 8.2*  PHOS 5.8* 4.5 5.2* 5.2* 4.8*    GFR: Estimated Creatinine Clearance: 12.1 mL/min (A) (by C-G formula based on SCr of 5.77 mg/dL (H)).  Liver Function Tests: Recent Labs  Lab 08/31/21 0230 09/01/21 0212 09/02/21 0127 09/03/21 0214 09/04/21 0256  ALBUMIN 1.7* 2.0* 2.0* 1.9* 1.8*   No results for input(s): LIPASE, AMYLASE in the last 168 hours.  No results for input(s): AMMONIA in the last 168 hours.  Coagulation Profile: Recent Labs  Lab 08/29/21 0637 08/30/21 0241  INR 1.7* 1.5*    Cardiac Enzymes: No results for input(s): CKTOTAL, CKMB, CKMBINDEX, TROPONINI in the last 168 hours.  BNP (last 3 results) No results for input(s): PROBNP in the last 8760 hours.  Lipid Profile: No results for input(s): CHOL, HDL, LDLCALC, TRIG, CHOLHDL, LDLDIRECT in the last 72 hours.  Thyroid Function Tests: No results for input(s): TSH, T4TOTAL, FREET4, T3FREE, THYROIDAB in the last 72 hours.  Anemia Panel: No results for input(s): VITAMINB12, FOLATE, FERRITIN, TIBC, IRON, RETICCTPCT in the last 72 hours.   Urine analysis:    Component Value Date/Time   COLORURINE STRAW (A) 08/23/2021 1140   APPEARANCEUR CLEAR 08/23/2021 1140   LABSPEC 1.008 08/23/2021 1140   PHURINE 6.0 08/23/2021 1140   GLUCOSEU 50 (A) 08/23/2021 1140   HGBUR SMALL (A) 08/23/2021 1140   BILIRUBINUR NEGATIVE 08/23/2021 1140   KETONESUR NEGATIVE 08/23/2021 1140   PROTEINUR >=300 (A) 08/23/2021 1140   UROBILINOGEN 0.2 06/26/2007 1023   NITRITE NEGATIVE 08/23/2021 1140   LEUKOCYTESUR NEGATIVE 08/23/2021 1140    Sepsis Labs: Lactic Acid, Venous No results found for: LATICACIDVEN  MICROBIOLOGY: No results found for this or any previous visit (from the past 240  hour(s)).   RADIOLOGY STUDIES/RESULTS: DG Chest Port 1 View  Result Date: 09/03/2021 CLINICAL DATA:  Shortness of breath EXAM: PORTABLE CHEST 1 VIEW COMPARISON:  08/23/2021 FINDINGS: Right dialysis catheter in place with the tip in the right atrium. Heart is normal size. Mild vascular congestion and interstitial prominence throughout the lungs, likely mild edema. No effusions or acute bony abnormality. IMPRESSION: Suspect mild pulmonary edema. Electronically Signed   By: Rolm Baptise M.D.   On: 09/03/2021 09:40     LOS: 12 days   Phillips Climes, MD  [facility]    To contact the attending provider between 7A-7P or the covering provider during after hours 7P-7A, please log into the web site [email or web address] and  access using universal [place] password for that web site. If you do not have the password, please call the hospital operator.  09/04/2021, 3:08 PM

## 2021-09-04 NOTE — Progress Notes (Signed)
[facility] NEPHROLOGY PROGRESS NOTE  Assessment/ Plan:  #Acute kidney injury, nonoliguric, with history of excessive NSAID's use/BC powders every day.  Peaked creatinine level of 6.95.  CT scan without obstruction or hydronephrosis.  UA with more than 300 protein and microscopic hematuria.  Serology evaluation including ANA, ANCA, C3, C4, SPEP, anti-GBM, double-stranded DNA, hep B, hep C, HIV negative.   -He underwent IR guided kidney biopsy on 9/6 complicated by postbiopsy bleeding. Biopsy: AL amyloidosis, lambda light chain composition (glomeruli, interstitium, arteries, and arterioles). Marked IF, 59% global glomerular sclerosis and marked arteriosclerosis. Final read/EM pending. -He is nonoliguric and creatinine level stable around 5.8 today.  He had right IJ temporary HD catheter placed on 9/8 in IR during embolization of the bleeding artery.  No indication for renal replacement therapy yet however I am concerned about his lack of appetite and possible dysgeusia, if this is not improving, then he may need a short HD treatment for clearance to see if this will help -Check daily lab, strict ins and out.  #Post kidney biopsy bleeding, syncope/acute blood loss anemia: He initially received continuous bladder irrigation.  Because of ongoing bleeding, he underwent embolization of right inferior pole arcuate artery and right IJ temporary HD catheter placement by IR on 9/8.  Still having some hematuria, not on CBI at the moment, urology is following; defer to them.  Monitor CBC.  #AL Amyloidosis on kidney biopsy -appreciate assistance with oncology, bone marrow biopsy per IR  #Pulm edema -on CXR 9/12, IVF stopped, received lasix 18m IV x 1 dose, prn diuresis  #Anemia, complicated by acute blood loss post kidney biopsy: Urology and IR is following.  Received multiple units of blood transfusion.  Hemoglobin is stable today.On admission FOBT was negative and treated with IV  iron.  #History of nephrolithiasis: Repeat CT scan without any obstruction or hydronephrosis.  Urology is following  #Metabolic acidosis: Continue oral sodium bicarbonate, stable CO2 level.  Subjective: Seen and examined bedside. No acute events. We have discussed biopsy results extensively yesterday afternoon. Appetite is still poor and has a bad taste which are all ongoing issues. Has some SOB which he is attributing to anxiety. Denies chest pain, orthopnea, n/v, brain fog, intractable hiccups/pruritis, new tremors  Objective Vital signs in last 24 hours: Vitals:   09/04/21 0052 09/04/21 0520 09/04/21 0834 09/04/21 1241  BP: 133/89 139/81 134/81 (!) 144/80  Pulse: 76  72 68  Resp: 13 20 18 18   Temp: 98.1 F (36.7 C) 98.1 F (36.7 C) (!) 97.5 F (36.4 C) 97.9 F (36.6 C)  TempSrc: Oral Oral Oral Oral  SpO2: 96% 92% 95% 93%  Weight:      Height:       Weight change:   Intake/Output Summary (Last 24 hours) at 09/04/2021 1426 Last data filed at 09/04/2021 1401 Gross per 24 hour  Intake 840 ml  Output 3835 ml  Net -2995 ml       Labs: Basic Metabolic Panel: Recent Labs  Lab 09/02/21 0127 09/03/21 0214 09/04/21 0256  NA 137 138 138  K 3.9 3.5 3.4*  CL 103 105 101  CO2 24 22 27   GLUCOSE 98 85 106*  BUN 37* 36* 37*  CREATININE 5.82* 5.74* 5.77*  CALCIUM 8.1* 8.1* 8.2*  PHOS 5.2* 5.2* 4.8*   Liver Function Tests: Recent Labs  Lab 09/02/21 0127 09/03/21 0214 09/04/21 0256  ALBUMIN 2.0* 1.9* 1.8*   No results for input(s): LIPASE, AMYLASE in the last 168 hours.  No results for input(s): AMMONIA in the last 168 hours. CBC: Recent Labs  Lab 08/31/21 0230 08/31/21 1529 09/01/21 0951 09/02/21 0151 09/03/21 0214 09/04/21 0818  WBC 11.2* 11.5* 11.3*  --  8.8 8.0  HGB 7.1* 7.6* 6.6* 8.4* 8.7* 9.5*  HCT 22.5* 24.5* 21.4* 26.3* 27.1* 30.0*  MCV 84.3 85.1 86.3  --  85.0 84.5  PLT 226 246 246  --  278 321   Cardiac Enzymes: No results for input(s): CKTOTAL,  CKMB, CKMBINDEX, TROPONINI in the last 168 hours. CBG: No results for input(s): GLUCAP in the last 168 hours.  Iron Studies: No results for input(s): IRON, TIBC, TRANSFERRIN, FERRITIN in the last 72 hours. Studies/Results: DG Chest Port 1 View  Result Date: 09/03/2021 CLINICAL DATA:  Shortness of breath EXAM: PORTABLE CHEST 1 VIEW COMPARISON:  08/23/2021 FINDINGS: Right dialysis catheter in place with the tip in the right atrium. Heart is normal size. Mild vascular congestion and interstitial prominence throughout the lungs, likely mild edema. No effusions or acute bony abnormality. IMPRESSION: Suspect mild pulmonary edema. Electronically Signed   By: Rolm Baptise M.D.   On: 09/03/2021 09:40    Medications: Infusions:  sodium chloride     sodium chloride 10 mL/hr at 08/27/21 2147   sodium chloride irrigation      Scheduled Medications:  Chlorhexidine Gluconate Cloth  6 each Topical Daily   feeding supplement (NEPRO CARB STEADY)  237 mL Oral TID BM   folic acid  2 mg Oral Daily   melatonin  5 mg Oral QHS   nicotine  14 mg Transdermal Q24H   pantoprazole  40 mg Oral BID AC   sodium bicarbonate  1,300 mg Oral TID   tamsulosin  0.4 mg Oral Daily    have reviewed scheduled and prn medications.  Physical Exam: General: Lying on bed comfortable, not in distress Heart: Regular rate rhythm, S1-S2 normal, no rubs Lungs: Clear bilateral, no wheezing or crackle Abdomen: Soft, nontender, nondistended Extremities: No LE edema. Neurology: Alert, awake, following commands, no asterixis Vascular Access: Right IJ temporary HD catheter in place.  Caleb Simmons 09/04/2021,2:26 PM  LOS: 12 days

## 2021-09-04 NOTE — Progress Notes (Signed)
Physical Therapy Treatment Patient Details Name: Caleb Simmons MRN: [id number] DOB: Dec 23, 1951 Today's Date: 09/04/2021   History of Present Illness Patient is a 70 y.o. male presenting with generalized weakness/shortness of breath-found to have AKI and severe normocytic anemia. Underwent right renal biopsy on 9/6-unfortunately postrenal biopsy-patient developed bleeding into the renal collecting system-with clot retention requiring insertion of three-way catheter and CBI.  Patient continued to have renal bleeding-on 9/8-IR performed embolization. Past medical history significant of nephrolithiasis, 1/2 PPD smoker    PT Comments    Pt is able to get up to side of bed with no help and required assistance to supervise his lines.  Pt is walking with touch on furniture and wall rail.  Would be worth trying a SPC to see if he is able to use it meaningfully, but may decline as he feels he is able to do this without a fall risk.  Follow for goals of acute PT and progress as tolerated, look for ways to increase safety and increase his independence to navigate on the halls.  Encourage OOB to chair.  Recommendations for follow up therapy are one component of a multi-disciplinary discharge planning process, led by the attending physician.  Recommendations may be updated based on patient status, additional functional criteria and insurance authorization.  Follow Up Recommendations  No PT follow up;Supervision - Intermittent     Equipment Recommendations  None recommended by PT    Recommendations for Other Services       Precautions / Restrictions Precautions Precautions: Fall Precaution Comments: has passed out once already Restrictions Weight Bearing Restrictions: No     Mobility  Bed Mobility Overal bed mobility: Modified Independent                  Transfers Overall transfer level: Needs assistance Equipment used: None Transfers: Sit to/from Stand Sit to Stand: Supervision          General transfer comment: monitored his lines but pt able to balance, although he reaches for furniture and wall rails  Ambulation/Gait Ambulation/Gait assistance: Supervision;Min guard Gait Distance (Feet): 150 Feet Assistive device: None;1 person hand held assist Gait Pattern/deviations: Step-through pattern;Decreased stride length;Wide base of support Gait velocity: decreased Gait velocity interpretation: <1.31 ft/sec, indicative of household ambulator General Gait Details: pt is reaching for objects to touch and steady himself but did not fall or have to catch himself   Stairs             Wheelchair Mobility    Modified Rankin (Stroke Patients Only)       Balance Overall balance assessment: Needs assistance Sitting-balance support: Feet supported Sitting balance-Leahy Scale: Good       Standing balance-Leahy Scale: Fair Standing balance comment: one hand to steady at times                            Cognition Arousal/Alertness: Awake/alert Behavior During Therapy: WFL for tasks assessed/performed Overall Cognitive Status: Within Functional Limits for tasks assessed                                        Exercises      General Comments General comments (skin integrity, edema, etc.): Pt is not using an AD but with his tendency to reach could benefit from [facility] possibly      Pertinent Vitals/Pain Pain Assessment:  No/denies pain    Home Living                      Prior Function            PT Goals (current goals can now be found in the care plan section) Acute Rehab PT Goals Patient Stated Goal: return home and feel better Progress towards PT goals: Progressing toward goals    Frequency    Min 3X/week      PT Plan Current plan remains appropriate    Co-evaluation              AM-PAC PT "6 Clicks" Mobility   Outcome Measure  Help needed turning from your back to your side while in a flat  bed without using bedrails?: None Help needed moving from lying on your back to sitting on the side of a flat bed without using bedrails?: None Help needed moving to and from a bed to a chair (including a wheelchair)?: None Help needed standing up from a chair using your arms (e.g., wheelchair or bedside chair)?: A Little Help needed to walk in hospital room?: A Little Help needed climbing 3-5 steps with a railing? : A Little 6 Click Score: 21    End of Session Equipment Utilized During Treatment: Gait belt Activity Tolerance: Patient tolerated treatment well Patient left: in chair (returned to bed as PT leaving for bandage change) Nurse Communication: Mobility status PT Visit Diagnosis: Other abnormalities of gait and mobility (R26.89)     Time: 3151-7616 PT Time Calculation (min) (ACUTE ONLY): 24 min  Charges:  $Gait Training: 8-22 mins $Therapeutic Activity: 8-22 mins             Ramond Dial 09/04/2021, 4:22 PM  Mee Hives, PT MS Acute Rehab Dept. Number: [place] [facility] [place]

## 2021-09-04 NOTE — Progress Notes (Signed)
Referring Physician(s): * No referring provider recorded for this case *  Supervising Physician: Aletta Edouard  Patient Status:  [facility] - In-pt  Chief Complaint:  Pt had random renal biopsy 08/28/21 with post biopsy bleeding.  CT abdomen 08/30/21 resulted:  IMPRESSION: The small perinephric hematoma along the right renal lower pole is stable, and within expected limits after percutaneous biopsy. However, there remains substantial clot burden within the bladder. Follow-up urology recommendations for management.       Subjective:  Pt noted to be sitting up in bed. Urine in bedside urinal dark red. Pt reports some clots when he urinates. No clots noted in urinal.  Pt is A&O, calm and pleasant. He has no complaints. Pt daughter-in-law at bedside.   Allergies: Patient has no known allergies.  Medications: Prior to Admission medications   Medication Sig Start Date End Date Taking? Authorizing Provider  amoxicillin-clavulanate (AUGMENTIN) 875-125 MG tablet Take 1 tablet by mouth 2 (two) times daily. 08/14/21  Yes [provider]  Aspirin-Salicylamide-Caffeine (BC FAST PAIN RELIEF) 650-195-33.3 MG PACK Take 1 Package by mouth daily as needed (pain).   Yes [provider]  promethazine-dextromethorphan (PROMETHAZINE-DM) 6.25-15 MG/5ML syrup Take 5 mLs by mouth every 4 (four) hours as needed for cough. 08/14/21  Yes [provider]     Vital Signs: BP 139/81 (BP Location: Left Arm)   Pulse 76   Temp 98.1 F (36.7 C) (Oral)   Resp 20   Ht 5\' 10"  (1.778 m)   Wt 180 lb (81.6 kg)   SpO2 92%   BMI 25.83 kg/m   Physical Exam Vitals reviewed.  Constitutional:      Appearance: Normal appearance. He is not ill-appearing.  Cardiovascular:     Rate and Rhythm: Normal rate.  Pulmonary:     Effort: Pulmonary effort is normal.  Skin:    General: Skin is warm and dry.  Neurological:     Mental Status: He is alert and oriented to person, place, and time.  Mental status is at baseline.  Psychiatric:        Mood and Affect: Mood normal.        Behavior: Behavior normal.        Thought Content: Thought content normal.        Judgment: Judgment normal.    Imaging: IR Angiogram Renal Left Selective  Result Date: 08/31/2021 INDICATION: 70 year old male with history of acute kidney injury of uncertain etiology status post ultrasound-guided right renal biopsy on 08/28/2021. Since biopsy, the patient has experienced gross hematuria with associated acute anemia.   EXAM: 1. Ultrasound-guided vascular access of the right internal jugular vein. 2. Temporary hemodialysis catheter placement. 3. Ultrasound-guided vascular access of the right common femoral artery. 4. Selective catheterization and angiography of the right renal artery. 5. Sub selective catheterization angiography of right inferior polar and arcuate artery branches. 6. Coil embolization of right inferior pole arcuate artery branch.   MEDICATIONS: None.   ANESTHESIA/SEDATION: Moderate (conscious) sedation was employed during this procedure. A total of Versed 3 mg and Fentanyl 50 mcg was administered intravenously.   Moderate Sedation Time: 66 minutes. The patient's level of consciousness and vital signs were monitored continuously by radiology nursing throughout the procedure under my direct supervision.   CONTRAST:  80mL OMNIPAQUE IOHEXOL 350 MG/ML SOLN, 96mL OMNIPAQUE IOHEXOL 350 MG/ML SOLN   FLUOROSCOPY TIME:  Fluoroscopy Time: 10.6 minutes, (811 mGy).   COMPLICATIONS: None immediate.   PROCEDURE: Informed consent was obtained from the  patient following explanation of the procedure, risks, benefits and alternatives. The patient understands, agrees and consents for the procedure. All questions were addressed. A time out was performed prior to the initiation of the procedure. Maximal barrier sterile technique utilized including caps, mask, sterile gowns, sterile gloves, large sterile drape, hand hygiene, and  chlorhexidine prep.   Preprocedure ultrasound evaluation of the right internal jugular vein demonstrated a patent and compressible vein free of internal echoes. Procedure was planned. Subdermal Local anesthesia was provided 1% lidocaine. A small skin nick was made. Under direct ultrasound visualization, a 21 gauge micropuncture needle was directed into the internal jugular vein. An image was captured and stored in the permanent record. A micropuncture set was inserted and exchanged for a J wire which was positioned in the inferior vena cava. Serial dilation was performed followed by placement of a 12.5 French, 24 cm Trialysis catheter. The catheter tip was positioned in the right atrium. Each lumen flushed and aspirated appropriately. The dialysis ports were locked with appropriate volume of heparin dwell. The middle central venous port was then used for sedation for the remainder of the procedure. The catheter was secured with a 0 silk retention suture. A sterile bandage was applied.   Preprocedure ultrasound evaluation of the right groin was performed which demonstrated a patent right common femoral artery. The procedure was planned. Subdermal Local anesthesia was provided with 1% lidocaine. A small skin nick was made. Under direct ultrasound visualization, a 21 gauge micropuncture needle was directed into the common femoral artery. An ultrasound image was captured and stored in the permanent record. A micro puncture set was inserted and a limited right lower extremity angiogram was performed which demonstrated appropriate puncture site for closure device use. A J wire was directed to the abdominal aorta and the micropuncture set was exchanged for a 5 [place] vascular sheath. A 5 French C2 catheter was then directed into the right renal ostium. Right renal angiogram was performed. The single main renal artery was patent. About 2 arcuate artery branches in the inferior pole there is abnormal truncation and vessel  irregularity with evidence of an early filling arteriovenous fistula in addition to suggestion of faint filling into the collecting system on delayed imaging.   A straight lantern microcatheter and 0.014" soft synchro wire was then inserted and directed into the inferior polar branch. Repeat angiogram was performed in the sub selective location which was significant for multifocal 2-4 mm pseudoaneurysm formation, abnormal truncation and irregularity of the arcuate and interlobular branches, an early arteriovenous shunting. The inferior polar arcuate branch was selected further. Coil embolization was performed with multiple low profile Penumbra Ruby coils ranging from 2-3 mm in diameter. Completion right inferior renal angiogram was performed which demonstrated appropriate embolization of the targeted vessels without persistent arteriovenous fistula or vessel irregularity.   The catheters were removed. The right common femoral artery was then closed with a 6 [place] Angio-Seal device. Distal pulses were unchanged. The patient tolerated the procedure well was transferred back to the floor in good condition.   IMPRESSION: 1. 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 2. Sub selective coil embolization of right inferior polar arcuate artery branch. 3. Successful placement of right internal jugular, 24 French Trialysis catheter with the catheter tip in the right atrium. The catheter is ready for immediate use.   Caleb Cancer, MD   [facility]   [facility]     Electronically Signed   By: Caleb Simmons M.D.   On: 08/31/2021 09:29    DG Chest Port 1 View  Result Date: 09/03/2021 CLINICAL DATA:  Shortness of breath EXAM: PORTABLE CHEST 1 VIEW COMPARISON:  08/23/2021 FINDINGS: Right dialysis catheter in place with the tip in the  right atrium. Heart is normal size. Mild vascular congestion and interstitial prominence throughout the lungs, likely mild edema. No effusions or acute bony abnormality. IMPRESSION: Suspect mild pulmonary edema. Electronically Signed   By: Rolm Baptise M.D.   On: 09/03/2021 09:40    Labs:  CBC: Recent Labs    08/31/21 0230 08/31/21 1529 09/01/21 0951 09/02/21 0151 09/03/21 0214  WBC 11.2* 11.5* 11.3*  --  8.8  HGB 7.1* 7.6* 6.6* 8.4* 8.7*  HCT 22.5* 24.5* 21.4* 26.3* 27.1*  PLT 226 246 246  --  278    COAGS: Recent Labs    08/24/21 1128 08/28/21 0305 08/29/21 0637 08/30/21 0241  INR 1.6* 1.5* 1.7* 1.5*    BMP: Recent Labs    09/01/21 0212 09/02/21 0127 09/03/21 0214 09/04/21 0256  NA 138 137 138 138  K 3.9 3.9 3.5 3.4*  CL 104 103 105 101  CO2 23 24 22 27   GLUCOSE 103* 98 85 106*  BUN 36* 37* 36* 37*  CALCIUM 8.2* 8.1* 8.1* 8.2*  CREATININE 5.98* 5.82* 5.74* 5.77*  GFRNONAA 9* 10* 10* 10*    LIVER FUNCTION TESTS: Recent Labs    08/23/21 1037 08/24/21 0055 08/25/21 0758 08/26/21 1036 08/27/21 0029 09/01/21 0212 09/02/21 0127 09/03/21 0214 09/04/21 0256  BILITOT 0.3 0.4  --  0.8  --   --   --   --   --   AST 17 14*  --  14*  --   --   --   --   --   ALT 10 10  --  9  --   --   --   --   --   ALKPHOS 66 57  --  56  --   --   --   --   --   PROT 5.6* 4.8*  --  5.0*  --   --   --   --   --   ALBUMIN 2.2* 1.9*   < > 1.9*   < > 2.0* 2.0* 1.9* 1.8*   < > = values in this interval not displayed.    Assessment and Plan: Pt had random renal biopsy 08/28/21 with post biopsy bleeding.   HGB stable at  8.7 IR to continue to follow  Electronically Signed: Tyson Alias, NP 09/04/2021, 8:21 AM   I spent a total of 15 Minutes at the the patient's bedside AND on the patient's hospital floor or unit, greater than 50% of which was counseling/coordinating care for post bleed random renal biopsy.

## 2021-09-04 NOTE — Consult Note (Signed)
Chief Complaint: Patient was seen in consultation today for bone marrow biopsy Chief Complaint  Patient presents with   Near Syncope   at the request of Dr Ky Barban    Supervising Physician: Sandi Mariscal  Patient Status: [facility] - In-pt  History of Present Illness: ASHELY Simmons is a 70 y.o. male  Known to IR Random renal bx 9/6-- with post bleeding Embolization 9/8 Pt is doing well Still with red urine Hgb stable 8.4--8.7  Renal biopsy with result: AL Amyloidosis; lamda light chain composition Request made per Hematology/Oncology for Bone marrow biopsy  Planned for today   History reviewed. No pertinent past medical history.  Past Surgical History:  Procedure Laterality Date   APPENDECTOMY     IR EMBO ART  VEN HEMORR LYMPH EXTRAV  INC GUIDE ROADMAPPING  08/30/2021   IR FLUORO GUIDE CV LINE RIGHT  08/30/2021   IR RENAL SELECTIVE  UNI INC S&I MOD SED  08/31/2021   IR US GUIDE VASC ACCESS RIGHT  08/30/2021   IR US GUIDE VASC ACCESS RIGHT  08/30/2021    Allergies: Patient has no known allergies.  Medications: Prior to Admission medications   Medication Sig Start Date End Date Taking? Authorizing Provider  amoxicillin-clavulanate (AUGMENTIN) 875-125 MG tablet Take 1 tablet by mouth 2 (two) times daily. 08/14/21  Yes [provider]  Aspirin-Salicylamide-Caffeine (BC FAST PAIN RELIEF) 650-195-33.3 MG PACK Take 1 Package by mouth daily as needed (pain).   Yes [provider]  promethazine-dextromethorphan (PROMETHAZINE-DM) 6.25-15 MG/5ML syrup Take 5 mLs by mouth every 4 (four) hours as needed for cough. 08/14/21  Yes [provider]     History reviewed. No pertinent family history.  Social History   Socioeconomic History   Marital status: Married    Spouse name: Not on file   Number of children: Not on file   Years of education: Not on file   Highest education level: Not on file  Occupational History   Not on file  Tobacco Use   Smoking  status: Some Days    Packs/day: 0.25    Years: 25.00    Pack years: 6.25    Types: Cigarettes   Smokeless tobacco: Never  Substance and Sexual Activity   Alcohol use: Not on file   Drug use: Not on file   Sexual activity: Not on file  Other Topics Concern   Not on file  Social History Narrative   Not on file   Social Determinants of Health   Financial Resource Strain: Not on file  Food Insecurity: Not on file  Transportation Needs: Not on file  Physical Activity: Not on file  Stress: Not on file  Social Connections: Not on file    Review of Systems: A 12 point ROS discussed and pertinent positives are indicated in the HPI above.  All other systems are negative.  Review of Systems  Constitutional:  Positive for activity change, appetite change and fatigue. Negative for fever.  Respiratory:  Negative for cough and shortness of breath.   Cardiovascular:  Negative for chest pain.  Gastrointestinal:  Negative for abdominal pain.  Musculoskeletal:  Positive for back pain.  Neurological:  Positive for weakness.  Psychiatric/Behavioral:  Negative for behavioral problems and confusion.    Vital Signs: BP 139/81 (BP Location: Left Arm)   Pulse 76   Temp 98.1 F (36.7 C) (Oral)   Resp 20   Ht _0  (1.778 m)   Wt 180 lb (81.6 kg)  SpO2 92%   BMI 25.83 kg/m   Physical Exam HENT:     Mouth/Throat:     Mouth: Mucous membranes are moist.  Cardiovascular:     Rate and Rhythm: Normal rate and regular rhythm.     Heart sounds: Normal heart sounds.  Pulmonary:     Effort: Pulmonary effort is normal.     Breath sounds: Normal breath sounds.  Abdominal:     Palpations: Abdomen is soft.  Musculoskeletal:        General: Normal range of motion.  Skin:    General: Skin is warm.  Neurological:     Mental Status: He is alert and oriented to person, place, and time.  Psychiatric:        Behavior: Behavior normal.    Imaging: CT ABDOMEN PELVIS WO CONTRAST  Result Date:  08/30/2021 CLINICAL DATA:  Retroperitoneal hematoma, follow up s/p random renal bx with perinephric hematoma development EXAM: CT ABDOMEN AND PELVIS WITHOUT CONTRAST TECHNIQUE: Multidetector CT imaging of the abdomen and pelvis was performed following the standard protocol without IV contrast. COMPARISON:  September 6 FINDINGS: Inferior chest: Trace bilateral pleural effusions with right greater than left compressive subsegmental atelectasis. Hepatobiliary: The liver is normal in size without focal abnormality. No intrahepatic or extrahepatic biliary ductal dilation. The gallbladder appears normal. Spleen: Normal in size without focal abnormality. Pancreas: No pancreatic ductal dilatation or surrounding inflammatory changes. Adrenals/Urinary Tract: Adrenal glands are unremarkable. The kidneys are normal in size. Tiny perinephric hematoma along the right renal lower pole is unchanged. Hyperdense material in the right collecting system and bladder consistent with blood products, grossly similar. Stomach/Bowel: The stomach, small bowel and large bowel are normal in caliber without abnormal wall thickening or surrounding inflammatory changes. Reproductive: Prostate is unremarkable. Lymphatic: No enlarged lymph nodes in the abdomen or pelvis. Vasculature: The abdominal aorta is normal in caliber. Aortic atherosclerosis. Other: No abdominopelvic ascites. Musculoskeletal: No aggressive osseous lesions. Degenerative changes at L5-S1. Bone island in the left ilium. IMPRESSION: The small perinephric hematoma along the right renal lower pole is stable, and within expected limits after percutaneous biopsy. However, there remains substantial clot burden within the bladder. Follow-up urology recommendations for management. Electronically Signed   By: Albin Felling M.D.   On: 08/30/2021 14:21   CT ABDOMEN PELVIS WO CONTRAST  Result Date: 08/29/2021 CLINICAL DATA:  Post right-sided renal biopsy, now with hematuria and  hypotension. EXAM: CT ABDOMEN AND PELVIS WITHOUT CONTRAST TECHNIQUE: Multidetector CT imaging of the abdomen and pelvis was performed following the standard protocol without IV contrast. COMPARISON:  CT abdomen pelvis-08/23/2021; ultrasound-guided right renal biopsy-earlier same day FINDINGS: The lack of intravenous contrast limits the ability to evaluate solid abdominal organs. Lower chest: Limited visualization of the lower thorax demonstrates interval development of trace bilateral effusion with worsening bibasilar heterogeneous/consolidative opacities, right greater than left. Previously identified nonspecific ground-glass opacities within the imaged lung bases is not seen on the present examination though there is mild residual intraseptal thickening. Normal heart size. Trace amount of pericardial fluid, unchanged presumably physiologic. There is diffuse decreased attenuation intra cardiac blood pool suggestive of anemia. Hepatobiliary: Normal hepatic contour. Apparent high density material within the gallbladder could represent biliary sludge. No definitive gallbladder wall thickening or pericholecystic stranding on this noncontrast examination. No ascites. Pancreas: Normal noncontrast appearance of the pancreas. Spleen: Normal noncontrast appearance of the spleen. Adrenals/Urinary Tract: There is a very tiny (approximately 2.7 x 1.6 x 1.2 cm) perinephric hematoma about the inferior pole of  the right kidney (axial image 44, series 3; coronal image 57, series 6), with minimal amount of adjacent perinephric stranding. High-density material is seen within the right renal collecting system and ureter with moderate to large amount of layering high-density material within urinary bladder, findings compatible with hemorrhage into the collecting system and bladder. Mild associated right-sided pelviectasis and ureterectasis. Normal noncontrast appearance of the left kidney. No evidence of left-sided nephrolithiasis or  urinary obstruction. Normal noncontrast appearance of the bilateral adrenal glands. Stomach/Bowel: Scattered minimal colonic diverticulosis without evidence of superimposed acute diverticulitis on this noncontrast examination. Normal appearance of the terminal ileum. The appendix is not visualized compatible with provided operative history. No discrete areas of bowel wall thickening on this noncontrast examination. No pneumoperitoneum, pneumatosis or portal venous gas. Vascular/Lymphatic: Moderate amount of atherosclerotic plaque within normal caliber abdominal aorta. Scattered retroperitoneal lymph nodes are numerous though individually not enlarged by size criteria with index left sided periaortic lymph node measuring 0.7 cm in greatest short axis diameter (image 31, series 3), presumably reactive in etiology. No bulky retroperitoneal, mesenteric, pelvic or inguinal lymphadenopathy on this noncontrast examination Reproductive: Dystrophic calcifications within normal sized prostate gland. Trace amount of fluid within the pelvic cul-de-sac. Other: Small bilateral mesenteric fat containing inguinal hernias, left greater than right. Minimal amount of subcutaneous edema about the midline of the low back. Presumed shrapnel is seen within the right lower abdominal/pelvic ventral abdominal wall. Musculoskeletal: No acute or aggressive osseous abnormalities. Mild-to-moderate multilevel lumbar spine DDD, worse at L4-L5 and L5-S1 with disc space height loss, endplate irregularity and small posteriorly directed disc osteophyte complexes at these locations. Mild degenerative change the bilateral hips with joint space loss, subchondral sclerosis and osteophytosis, right greater than left. IMPRESSION: 1. Post right-sided renal biopsy complicated by tiny (approximately 2.7 cm) perinephric hematoma and bleeding into the right renal collecting system including moderate to large-sized clot within the urinary bladder and associated  mild right-sided pelviectasis and ureterectasis. Consideration for initiation of continuous bladder irrigation could be performed as indicated. 2. Trace bilateral effusions with associated bibasilar opacities, right greater than left, likely atelectasis. 3. Colonic diverticulosis without evidence superimposed acute diverticulitis. 4.  Aortic Atherosclerosis (ICD10-I70.0). Critical Value/emergent results were called by telephone at the time of interpretation on 08/28/2021 at 5:04 pm to provider [facility] , who verbally acknowledged these results. Electronically Signed   By: Sandi Mariscal M.D.   On: 08/29/2021 10:38   DG Chest 2 View  Result Date: 08/23/2021 CLINICAL DATA:  Near syncope. EXAM: CHEST - 2 VIEW COMPARISON:  None. FINDINGS: The lungs are clear without focal pneumonia, edema, pneumothorax or pleural effusion. Cardiopericardial silhouette is at upper limits of normal for size. The visualized bony structures of the thorax show no acute abnormality. Telemetry leads overlie the chest. IMPRESSION: No active cardiopulmonary disease. Electronically Signed   By: Misty Stanley M.D.   On: 08/23/2021 09:15   NM Pulmonary Perfusion  Result Date: 08/23/2021 CLINICAL DATA:  PE suspected, shortness of breath EXAM: NUCLEAR MEDICINE PERFUSION LUNG SCAN TECHNIQUE: Perfusion images were obtained in multiple projections after intravenous injection of radiopharmaceutical. Ventilation scans intentionally deferred if perfusion scan and chest x-ray adequate for interpretation during COVID 19 epidemic. RADIOPHARMACEUTICALS:  4.2 mCi Tc-75mMAA IV COMPARISON:  Same-day chest radiographs FINDINGS: Normal, homogeneous pulmonary perfusion. No suspicious filling defects. IMPRESSION: Very low probability examination for pulmonary embolism by modified perfusion only PIOPED criteria (PE absent). Electronically Signed   By: AEddie CandleM.D.   On: 08/23/2021 15:59  IR Angiogram Renal Left Selective  INDICATION: 70 year old  male with history of acute kidney injury of uncertain etiology status post ultrasound-guided right renal biopsy on 08/28/2021. Since biopsy, the patient has experienced gross hematuria with associated acute anemia.   EXAM: 1. Ultrasound-guided vascular access of the right internal jugular vein. 2. Temporary hemodialysis catheter placement. 3. Ultrasound-guided vascular access of the right common femoral artery. 4. Selective catheterization and angiography of the right renal artery. 5. Sub selective catheterization angiography of right inferior polar and arcuate artery branches. 6. Coil embolization of right inferior pole arcuate artery branch.   MEDICATIONS: None.   ANESTHESIA/SEDATION: Moderate (conscious) sedation was employed during this procedure. A total of Versed 3 mg and Fentanyl 50 mcg was administered intravenously.   Moderate Sedation Time: 66 minutes. The patient's level of consciousness and vital signs were monitored continuously by radiology nursing throughout the procedure under my direct supervision.   CONTRAST:  47m OMNIPAQUE IOHEXOL 350 MG/ML SOLN, 557mOMNIPAQUE IOHEXOL 350 MG/ML SOLN   FLUOROSCOPY TIME:  Fluoroscopy Time: 10.6 minutes, (811 mGy).   COMPLICATIONS: None immediate.   PROCEDURE: Informed consent was obtained from the patient following explanation of the procedure, risks, benefits and alternatives. The patient understands, agrees and consents for the procedure. All questions were addressed. A time out was performed prior to the initiation of the procedure. Maximal barrier sterile technique utilized including caps, mask, sterile gowns, sterile gloves, large sterile drape, hand hygiene, and chlorhexidine prep.   Preprocedure ultrasound evaluation of the right internal jugular vein demonstrated a patent and compressible vein free of internal echoes. Procedure was planned. Subdermal Local anesthesia was provided 1% lidocaine. A small skin nick was made. Under direct ultrasound  visualization, a 21 gauge micropuncture needle was directed into the internal jugular vein. An image was captured and stored in the permanent record. A micropuncture set was inserted and exchanged for a J wire which was positioned in the inferior vena cava. Serial dilation was performed followed by placement of a 12.5 French, 24 cm Trialysis catheter. The catheter tip was positioned in the right atrium. Each lumen flushed and aspirated appropriately. The dialysis ports were locked with appropriate volume of heparin dwell. The middle central venous port was then used for sedation for the remainder of the procedure. The catheter was secured with a 0 silk retention suture. A sterile bandage was applied.   Preprocedure ultrasound evaluation of the right groin was performed which demonstrated a patent right common femoral artery. The procedure was planned. Subdermal Local anesthesia was provided with 1% lidocaine. A small skin nick was made. Under direct ultrasound visualization, a 21 gauge micropuncture needle was directed into the common femoral artery. An ultrasound image was captured and stored in the permanent record. A micro puncture set was inserted and a limited right lower extremity angiogram was performed which demonstrated appropriate puncture site for closure device use. A J wire was directed to the abdominal aorta and the micropuncture set was exchanged for a 5 [place] sheath. A 5 French C2 catheter was then directed into the right renal ostium. Right renal angiogram was performed. The single main renal artery was patent. 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   A straight lantern microcatheter and 0.014" soft synchro wire was then inserted and directed into the inferior polar branch.  Repeat  angiogram was performed in the sub selective location which was significant for multifocal 2-4 mm pseudoaneurysm formation, abnormal truncation and irregularity of the arcuate and interlobular branches, an early arteriovenous shunting. The inferior polar arcuate branch was selected further. Coil embolization was performed with multiple low profile Penumbra Ruby coils ranging from 2-3 mm in diameter. Completion right inferior renal angiogram was performed which demonstrated appropriate embolization of the targeted vessels without persistent arteriovenous fistula or vessel irregularity.   The catheters were removed. The right common femoral artery was then closed with a 6 [place] Angio-Seal device. Distal pulses were unchanged. The patient tolerated the procedure well was transferred back to the floor in good condition.   IMPRESSION: 1. 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 2. Sub selective coil embolization of right inferior polar arcuate artery branch. 3. Successful placement of right internal jugular, 24 French Trialysis catheter with the catheter tip in the right atrium. The catheter is ready for immediate use.   Ruthann Cancer, MD   [facility]   [facility]     Electronically Signed   By: Ruthann Cancer M.D.   On: 08/31/2021 09:29    IR Fluoro Guide CV Line Right  Result Date: 08/31/2021 INDICATION: 70 year old male with history of acute kidney injury of uncertain etiology status post ultrasound-guided right renal biopsy on 08/28/2021. Since biopsy, the patient has experienced gross hematuria with associated acute anemia. EXAM: 1. Ultrasound-guided vascular access of the right internal jugular vein. 2. Temporary hemodialysis catheter placement. 3. Ultrasound-guided vascular access of the right common femoral artery.  4. Selective catheterization and angiography of the right renal artery. 5. Sub selective catheterization angiography of right inferior polar and arcuate artery branches. 6. Coil embolization of right inferior pole arcuate artery branch. MEDICATIONS: None. ANESTHESIA/SEDATION: Moderate (conscious) sedation was employed during this procedure. A total of Versed 3 mg and Fentanyl 50 mcg was administered intravenously. Moderate Sedation Time: 66 minutes. The patient's level of consciousness and vital signs were monitored continuously by radiology nursing throughout the procedure under my direct supervision. CONTRAST:  42m OMNIPAQUE IOHEXOL 350 MG/ML SOLN, 512mOMNIPAQUE IOHEXOL 350 MG/ML SOLN FLUOROSCOPY TIME:  Fluoroscopy Time: 10.6 minutes, (811 mGy). COMPLICATIONS: None immediate. PROCEDURE: Informed consent was obtained from the patient following explanation of the procedure, risks, benefits and alternatives. The patient understands, agrees and consents for the procedure. All questions were addressed. A time out was performed prior to the initiation of the procedure. Maximal barrier sterile technique utilized including caps, mask, sterile gowns, sterile gloves, large sterile drape, hand hygiene, and chlorhexidine prep. Preprocedure ultrasound evaluation of the right internal jugular vein demonstrated a patent and compressible vein free of internal echoes. Procedure was planned. Subdermal Local anesthesia was provided 1% lidocaine. A small skin nick was made. Under direct ultrasound visualization, a 21 gauge micropuncture needle was directed into the internal jugular vein. An image was captured and stored in the permanent record. A micropuncture set was inserted and exchanged for a J wire which was positioned in the inferior vena cava. Serial dilation was performed followed by placement of a 12.5 French, 24 cm Trialysis catheter. The catheter tip was positioned in the right atrium. Each lumen flushed and aspirated  appropriately. The dialysis ports were locked with appropriate volume of heparin dwell. The middle central venous port was then used for sedation for the remainder of the  procedure. The catheter was secured with a 0 silk retention suture. A sterile bandage was applied. Preprocedure ultrasound evaluation of the right groin was performed which demonstrated a patent right common femoral artery. The procedure was planned. Subdermal Local anesthesia was provided with 1% lidocaine. A small skin nick was made. Under direct ultrasound visualization, a 21 gauge micropuncture needle was directed into the common femoral artery. An ultrasound image was captured and stored in the permanent record. A micro puncture set was inserted and a limited right lower extremity angiogram was performed which demonstrated appropriate puncture site for closure device use. A J wire was directed to the abdominal aorta and the micropuncture set was exchanged for a 5 [place] vascular sheath. A 5 French C2 catheter was then directed into the right renal ostium. Right renal angiogram was performed. The single main renal artery was patent. 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 A straight lantern microcatheter and 0.014" soft synchro wire was then inserted and directed into the inferior polar branch. Repeat angiogram was performed in the sub selective location which was significant for multifocal 2-4 mm pseudoaneurysm formation, abnormal truncation and irregularity of the arcuate and interlobular branches, an early arteriovenous shunting. The inferior polar arcuate branch was selected further. Coil embolization was performed with multiple low profile Penumbra Ruby coils ranging from 2-3 mm in diameter. Completion right inferior renal  angiogram was performed which demonstrated appropriate embolization of the targeted vessels without persistent arteriovenous fistula or vessel irregularity. The catheters were removed. The right common femoral artery was then closed with a 6 [place] Angio-Seal device. Distal pulses were unchanged. The patient tolerated the procedure well was transferred back to the floor in good condition. IMPRESSION: 1. 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 2. Sub selective coil embolization of right inferior polar arcuate artery branch. 3. Successful placement of right internal jugular, 24 French Trialysis catheter with the catheter tip in the right atrium. The catheter is ready for immediate use. Ruthann Cancer, MD [facility] Electronically Signed   By: Ruthann Cancer M.D.   On: 08/31/2021 09:29   IR US Guide Vasc Access Right  Result Date: 08/31/2021 INDICATION: 70 year old male with history of acute kidney injury of uncertain etiology status post ultrasound-guided right renal biopsy on 08/28/2021. Since biopsy, the patient has experienced gross hematuria with associated acute anemia. EXAM: 1. Ultrasound-guided vascular access of the right internal jugular vein. 2. Temporary hemodialysis catheter placement. 3. Ultrasound-guided vascular access of the right common femoral artery. 4. Selective catheterization and angiography of the right renal artery. 5. Sub selective catheterization angiography of right inferior polar and arcuate artery branches. 6. Coil embolization of right inferior pole arcuate artery branch. MEDICATIONS: None. ANESTHESIA/SEDATION: Moderate (conscious) sedation was employed during this procedure. A total of Versed 3 mg and Fentanyl 50 mcg was administered intravenously. Moderate Sedation Time: 66  minutes. The patient's level of consciousness and vital signs were monitored continuously by radiology nursing throughout the procedure under my direct supervision. CONTRAST:  52m OMNIPAQUE IOHEXOL 350 MG/ML SOLN, 523mOMNIPAQUE IOHEXOL 350 MG/ML SOLN FLUOROSCOPY TIME:  Fluoroscopy Time: 10.6 minutes, (811 mGy). COMPLICATIONS: None immediate. PROCEDURE: Informed consent was obtained from the patient following  explanation of the procedure, risks, benefits and alternatives. The patient understands, agrees and consents for the procedure. All questions were addressed. A time out was performed prior to the initiation of the procedure. Maximal barrier sterile technique utilized including caps, mask, sterile gowns, sterile gloves, large sterile drape, hand hygiene, and chlorhexidine prep. Preprocedure ultrasound evaluation of the right internal jugular vein demonstrated a patent and compressible vein free of internal echoes. Procedure was planned. Subdermal Local anesthesia was provided 1% lidocaine. A small skin nick was made. Under direct ultrasound visualization, a 21 gauge micropuncture needle was directed into the internal jugular vein. An image was captured and stored in the permanent record. A micropuncture set was inserted and exchanged for a J wire which was positioned in the inferior vena cava. Serial dilation was performed followed by placement of a 12.5 French, 24 cm Trialysis catheter. The catheter tip was positioned in the right atrium. Each lumen flushed and aspirated appropriately. The dialysis ports were locked with appropriate volume of heparin dwell. The middle central venous port was then used for sedation for the remainder of the procedure. The catheter was secured with a 0 silk retention suture. A sterile bandage was applied. Preprocedure ultrasound evaluation of the right groin was performed which demonstrated a patent right common femoral artery. The procedure was planned. Subdermal Local  anesthesia was provided with 1% lidocaine. A small skin nick was made. Under direct ultrasound visualization, a 21 gauge micropuncture needle was directed into the common femoral artery. An ultrasound image was captured and stored in the permanent record. A micro puncture set was inserted and a limited right lower extremity angiogram was performed which demonstrated appropriate puncture site for closure device use. A J wire was directed to the abdominal aorta and the micropuncture set was exchanged for a 5 [place] vascular sheath. A 5 French C2 catheter was then directed into the right renal ostium. Right renal angiogram was performed. The single main renal artery was patent. 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 A straight lantern microcatheter and 0.014" soft synchro wire was then inserted and directed into the inferior polar branch. Repeat angiogram was performed in the sub selective location which was significant for multifocal 2-4 mm pseudoaneurysm formation, abnormal truncation and irregularity of the arcuate and interlobular branches, an early arteriovenous shunting. The inferior polar arcuate branch was selected further. Coil embolization was performed with multiple low profile Penumbra Ruby coils ranging from 2-3 mm in diameter. Completion right inferior renal angiogram was performed which demonstrated appropriate embolization of the targeted vessels without persistent arteriovenous fistula or vessel irregularity. The catheters were removed. The right common femoral artery was then closed with a 6 [place] Angio-Seal device. Distal pulses were unchanged. The patient tolerated the procedure well was transferred back to the floor in good condition. IMPRESSION: 1. 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 2. Sub selective coil embolization of right inferior polar arcuate artery branch. 3. Successful placement of right internal jugular, 24 French Trialysis catheter with the catheter tip in the right atrium. The catheter is ready for immediate use. Ruthann Cancer, MD [facility] Radiology Electronically Signed   By: Ruthann Cancer M.D.   On: 08/31/2021  09:29   IR US Guide Vasc Access Right  Result Date: 08/31/2021 INDICATION: 70 year old male with history of acute kidney injury of uncertain etiology status post ultrasound-guided right renal biopsy on 08/28/2021. Since biopsy, the patient has experienced gross hematuria with associated acute anemia. EXAM: 1. Ultrasound-guided vascular access of the right internal jugular vein. 2. Temporary hemodialysis catheter placement. 3. Ultrasound-guided vascular access of the right common femoral artery. 4. Selective catheterization and angiography of the right renal artery. 5. Sub selective catheterization angiography of right inferior polar and arcuate artery branches. 6. Coil embolization of right inferior pole arcuate artery branch. MEDICATIONS: None. ANESTHESIA/SEDATION: Moderate (conscious) sedation was employed during this procedure. A total of Versed 3 mg and Fentanyl 50 mcg was administered intravenously. Moderate Sedation Time: 66 minutes. The patient's level of consciousness and vital signs were monitored continuously by radiology nursing throughout the procedure under my direct supervision. CONTRAST:  3m OMNIPAQUE IOHEXOL 350 MG/ML SOLN, 545mOMNIPAQUE IOHEXOL 350 MG/ML SOLN FLUOROSCOPY TIME:  Fluoroscopy Time: 10.6 minutes, (811 mGy). COMPLICATIONS: None immediate. PROCEDURE: Informed consent was obtained from the patient following explanation of the procedure,  risks, benefits and alternatives. The patient understands, agrees and consents for the procedure. All questions were addressed. A time out was performed prior to the initiation of the procedure. Maximal barrier sterile technique utilized including caps, mask, sterile gowns, sterile gloves, large sterile drape, hand hygiene, and chlorhexidine prep. Preprocedure ultrasound evaluation of the right internal jugular vein demonstrated a patent and compressible vein free of internal echoes. Procedure was planned. Subdermal Local anesthesia was provided 1% lidocaine. A small skin nick was made. Under direct ultrasound visualization, a 21 gauge micropuncture needle was directed into the internal jugular vein. An image was captured and stored in the permanent record. A micropuncture set was inserted and exchanged for a J wire which was positioned in the inferior vena cava. Serial dilation was performed followed by placement of a 12.5 French, 24 cm Trialysis catheter. The catheter tip was positioned in the right atrium. Each lumen flushed and aspirated appropriately. The dialysis ports were locked with appropriate volume of heparin dwell. The middle central venous port was then used for sedation for the remainder of the procedure. The catheter was secured with a 0 silk retention suture. A sterile bandage was applied. Preprocedure ultrasound evaluation of the right groin was performed which demonstrated a patent right common femoral artery. The procedure was planned. Subdermal Local anesthesia was provided with 1% lidocaine. A small skin nick was made. Under direct ultrasound visualization, a 21 gauge micropuncture needle was directed into the common femoral artery. An ultrasound image was captured and stored in the permanent record. A micro puncture set was inserted and a limited right lower extremity angiogram was performed which demonstrated appropriate puncture site for closure device use. A J wire was directed to the  abdominal aorta and the micropuncture set was exchanged for a 5 [place] sheath. A 5 French C2 catheter was then directed into the right renal ostium. Right renal angiogram was performed. The single main renal artery was patent. 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 A straight lantern microcatheter and 0.014" soft synchro wire was then inserted and directed into the inferior polar branch. Repeat angiogram was performed in the sub selective location which was significant for multifocal 2-4  mm pseudoaneurysm formation, abnormal truncation and irregularity of the arcuate and interlobular branches, an early arteriovenous shunting. The inferior polar arcuate branch was selected further. Coil embolization was performed with multiple low profile Penumbra Ruby coils ranging from 2-3 mm in diameter. Completion right inferior renal angiogram was performed which demonstrated appropriate embolization of the targeted vessels without persistent arteriovenous fistula or vessel irregularity. The catheters were removed. The right common femoral artery was then closed with a 6 [place] Angio-Seal device. Distal pulses were unchanged. The patient tolerated the procedure well was transferred back to the floor in good condition. IMPRESSION: 1. 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 2. Sub selective coil embolization of right inferior polar arcuate artery branch. 3. Successful placement of right internal jugular, 24 French Trialysis catheter with the catheter tip in the right atrium. The catheter is ready for immediate use. Ruthann Cancer, MD [facility] Radiology Electronically Signed   By: Ruthann Cancer M.D.   On: 08/31/2021 09:29   DG Chest Port 1 View  Result Date: 09/03/2021 CLINICAL DATA:  Shortness of breath EXAM: PORTABLE CHEST 1 VIEW COMPARISON:  08/23/2021 FINDINGS: Right dialysis catheter in place with the tip in the right atrium. Heart is normal size. Mild vascular congestion and interstitial prominence throughout the lungs, likely mild edema. No effusions or acute bony abnormality. IMPRESSION: Suspect mild pulmonary edema. Electronically Signed   By: Rolm Baptise M.D.   On: 09/03/2021 09:40   ECHOCARDIOGRAM COMPLETE  Result Date: 08/24/2021    ECHOCARDIOGRAM REPORT   Patient Name:   Caleb Simmons Date of Exam: 08/24/2021 Medical Rec #:  [id number]    Height:       70.0 in Accession #:    [id number]   Weight:       180.0 lb Date of Birth:  08/29/1951     BSA:          1.996 m Patient Age:    24 years     BP:           120/69 mmHg Patient Gender: M            HR:           85 bpm. Exam Location:  Inpatient Procedure: 2D Echo, Cardiac Doppler and Color Doppler Indications:    Abnormal EKG  History:        Patient has no prior history of Echocardiogram examinations.                 COPD, Signs/Symptoms:Dyspnea and Weakness, renal disease,                 anemia, elevated troponin; Risk Factors:Current Smoker.  Sonographer:    Dustin Flock RDCS Referring Phys: [place]  Sonographer Comments: Image acquisition challenging due to COPD. IMPRESSIONS  1. Left ventricular ejection fraction, by estimation, is 55 to 60%. The left ventricle has normal function. The left ventricle has no regional wall motion abnormalities. There is mild concentric left ventricular hypertrophy. Left ventricular diastolic parameters are consistent with Grade I diastolic dysfunction (impaired relaxation).  2. Right ventricular systolic function is normal. The right ventricular size is normal. Tricuspid regurgitation signal is  inadequate for assessing PA pressure.  3. The mitral valve is grossly normal. No evidence of mitral valve regurgitation. No evidence of mitral stenosis.  4. The aortic valve  was not well visualized. Aortic valve regurgitation is not visualized. No aortic stenosis is present.  5. The inferior vena cava is normal in size with greater than 50% respiratory variability, suggesting right atrial pressure of 3 mmHg. Comparison(s): No prior Echocardiogram. FINDINGS  Left Ventricle: Left ventricular ejection fraction, by estimation, is 55 to 60%. The left ventricle has normal function. The left ventricle has no regional wall motion abnormalities. The left ventricular internal cavity size was normal in size. There is  mild concentric left ventricular hypertrophy. Left ventricular diastolic parameters are consistent with Grade I diastolic dysfunction (impaired relaxation). Right Ventricle: The right ventricular size is normal. No increase in right ventricular wall thickness. Right ventricular systolic function is normal. Tricuspid regurgitation signal is inadequate for assessing PA pressure. Left Atrium: Left atrial size was normal in size. Right Atrium: Right atrial size was normal in size. Pericardium: There is no evidence of pericardial effusion. Mitral Valve: The mitral valve is grossly normal. No evidence of mitral valve regurgitation. No evidence of mitral valve stenosis. Tricuspid Valve: The tricuspid valve is normal in structure. Tricuspid valve regurgitation is not demonstrated. No evidence of tricuspid stenosis. Aortic Valve: The aortic valve was not well visualized. Aortic valve regurgitation is not visualized. No aortic stenosis is present. Pulmonic Valve: The pulmonic valve was not well visualized. Pulmonic valve regurgitation is not visualized. Aorta: The aortic root is normal in size and structure. Venous: The inferior vena cava is normal in size with greater than 50% respiratory variability, suggesting right  atrial pressure of 3 mmHg. IAS/Shunts: The atrial septum is grossly normal.  LEFT VENTRICLE PLAX 2D LVIDd:         5.80 cm      Diastology LVIDs:         3.60 cm      LV e' medial:    5.55 cm/s LV PW:         1.30 cm      LV E/e' medial:  14.5 LV IVS:        1.30 cm      LV e' lateral:   6.74 cm/s LVOT diam:     2.40 cm      LV E/e' lateral: 12.0 LV SV:         76 LV SV Index:   38 LVOT Area:     4.52 cm  LV Volumes (MOD) LV vol d, MOD A4C: 124.0 ml LV vol s, MOD A4C: 47.8 ml LV SV MOD A4C:     124.0 ml RIGHT VENTRICLE RV Basal diam:  2.80 cm RV S prime:     10.10 cm/s TAPSE (M-mode): 2.9 cm LEFT ATRIUM             Index       RIGHT ATRIUM           Index LA diam:        3.80 cm 1.90 cm/m  RA Area:     13.10 cm LA Vol (A2C):   31.8 ml 15.93 ml/m RA Volume:   29.70 ml  14.88 ml/m LA Vol (A4C):   33.0 ml 16.53 ml/m LA Biplane Vol: 34.9 ml 17.49 ml/m  AORTIC VALVE LVOT Vmax:   98.30 cm/s LVOT Vmean:  60.300 cm/s LVOT VTI:    0.169 m  AORTA Ao Root diam: 3.30 cm MITRAL VALVE MV Area (PHT): 3.85 cm    SHUNTS MV Decel Time: 197 msec    Systemic VTI:  0.17 m MV E velocity:  80.60 cm/s  Systemic Diam: 2.40 cm MV A velocity: 49.30 cm/s MV E/A ratio:  1.63 Rudean Haskell MD Electronically signed by Rudean Haskell MD Signature Date/Time: 08/24/2021/11:38:40 AM    Final    CT Renal Stone Study  Result Date: 08/23/2021 CLINICAL DATA:  Hematuria, new renal failure EXAM: CT ABDOMEN AND PELVIS WITHOUT CONTRAST TECHNIQUE: Multidetector CT imaging of the abdomen and pelvis was performed following the standard protocol without IV contrast. COMPARISON:  06/26/2007 FINDINGS: Lower chest: There is mild, scattered, nonspecific ground-glass and fine nodularity throughout the included bilateral lung bases (series 5, 4). Hepatobiliary: No solid liver abnormality is seen. No gallstones, gallbladder wall thickening, or biliary dilatation. Pancreas: Unremarkable. No pancreatic ductal dilatation or surrounding inflammatory  changes. Spleen: Normal in size without significant abnormality. Adrenals/Urinary Tract: Adrenal glands are unremarkable. Kidneys are normal, without renal calculi, solid lesion, or hydronephrosis. Distended urinary bladder, measuring at least 17.4 cm. Stomach/Bowel: Stomach is within normal limits. Appendix not clearly visualized and may be surgically. No evidence of bowel wall thickening, distention, or inflammatory changes. Descending and sigmoid diverticulosis. Vascular/Lymphatic: Aortic atherosclerosis. No enlarged abdominal or pelvic lymph nodes. Reproductive: No mass or other significant abnormality. Other: Small, fat containing bilateral inguinal hernias no abdominopelvic ascites. Musculoskeletal: No acute or significant osseous findings. IMPRESSION: 1. No evidence of urinary tract calculus or hydronephrosis. 2. Distended urinary bladder, measuring at least 17.4 cm. Correlate for urinary retention. 3. Descending and sigmoid diverticulosis without evidence of acute diverticulitis. 4. Mild, nonspecific scattered ground-glass and fine nodularity throughout the bilateral lung bases, possibly infectious or inflammatory. Aortic Atherosclerosis (ICD10-I70.0). Electronically Signed   By: Eddie Candle M.D.   On: 08/23/2021 16:04   US BIOPSY (KIDNEY)  Result Date: 08/28/2021 INDICATION: Acute kidney injury of uncertain etiology. Please perform image guided biopsy for tissue diagnostic purposes. EXAM: ULTRASOUND GUIDED RENAL BIOPSY COMPARISON:  CT abdomen and pelvis-08/23/2021 MEDICATIONS: None. ANESTHESIA/SEDATION: Fentanyl 100 mcg IV; Versed 2 mg IV Total Moderate Sedation time: 14 minutes; The patient was continuously monitored during the procedure by the interventional radiology nurse under my direct supervision. COMPLICATIONS: None immediate. PROCEDURE: Informed written consent was obtained from the patient after a discussion of the risks, benefits and alternatives to treatment. The patient understands and  consents the procedure. A timeout was performed prior to the initiation of the procedure. Ultrasound scanning was performed of the bilateral flanks. The inferior pole of the right kidney was selected for biopsy due to location and sonographic window. The procedure was planned. The operative site was prepped and draped in the usual sterile fashion. The overlying soft tissues were anesthetized with 1% lidocaine with epinephrine. A 17 gauge core needle biopsy device was advanced into the inferior cortex of the right kidney and 3 core biopsies were obtained under direct ultrasound guidance. Images were saved for documentation purposes. The biopsy device was removed and hemostasis was obtained with manual compression. Post procedural scanning was negative for significant post procedural hemorrhage or additional complication. A dressing was placed. The patient tolerated the procedure well without immediate post procedural complication. IMPRESSION: Technically successful ultrasound guided right renal biopsy. Electronically Signed   By: Sandi Mariscal M.D.   On: 08/28/2021 12:52   IR EMBO ART  VEN HEMORR LYMPH EXTRAV  INC GUIDE ROADMAPPING  Result Date: 08/31/2021 INDICATION: 70 year old male with history of acute kidney injury of uncertain etiology status post ultrasound-guided right renal biopsy on 08/28/2021. Since biopsy, the patient has experienced gross hematuria with associated acute anemia. EXAM: 1. Ultrasound-guided vascular  access of the right internal jugular vein. 2. Temporary hemodialysis catheter placement. 3. Ultrasound-guided vascular access of the right common femoral artery. 4. Selective catheterization and angiography of the right renal artery. 5. Sub selective catheterization angiography of right inferior polar and arcuate artery branches. 6. Coil embolization of right inferior pole arcuate artery branch. MEDICATIONS: None. ANESTHESIA/SEDATION: Moderate (conscious) sedation was employed during this  procedure. A total of Versed 3 mg and Fentanyl 50 mcg was administered intravenously. Moderate Sedation Time: 66 minutes. The patient's level of consciousness and vital signs were monitored continuously by radiology nursing throughout the procedure under my direct supervision. CONTRAST:  56m OMNIPAQUE IOHEXOL 350 MG/ML SOLN, 595mOMNIPAQUE IOHEXOL 350 MG/ML SOLN FLUOROSCOPY TIME:  Fluoroscopy Time: 10.6 minutes, (811 mGy). COMPLICATIONS: None immediate. PROCEDURE: Informed consent was obtained from the patient following explanation of the procedure, risks, benefits and alternatives. The patient understands, agrees and consents for the procedure. All questions were addressed. A time out was performed prior to the initiation of the procedure. Maximal barrier sterile technique utilized including caps, mask, sterile gowns, sterile gloves, large sterile drape, hand hygiene, and chlorhexidine prep. Preprocedure ultrasound evaluation of the right internal jugular vein demonstrated a patent and compressible vein free of internal echoes. Procedure was planned. Subdermal Local anesthesia was provided 1% lidocaine. A small skin nick was made. Under direct ultrasound visualization, a 21 gauge micropuncture needle was directed into the internal jugular vein. An image was captured and stored in the permanent record. A micropuncture set was inserted and exchanged for a J wire which was positioned in the inferior vena cava. Serial dilation was performed followed by placement of a 12.5 French, 24 cm Trialysis catheter. The catheter tip was positioned in the right atrium. Each lumen flushed and aspirated appropriately. The dialysis ports were locked with appropriate volume of heparin dwell. The middle central venous port was then used for sedation for the remainder of the procedure. The catheter was secured with a 0 silk retention suture. A sterile bandage was applied. Preprocedure ultrasound evaluation of the right groin was  performed which demonstrated a patent right common femoral artery. The procedure was planned. Subdermal Local anesthesia was provided with 1% lidocaine. A small skin nick was made. Under direct ultrasound visualization, a 21 gauge micropuncture needle was directed into the common femoral artery. An ultrasound image was captured and stored in the permanent record. A micro puncture set was inserted and a limited right lower extremity angiogram was performed which demonstrated appropriate puncture site for closure device use. A J wire was directed to the abdominal aorta and the micropuncture set was exchanged for a 5 [place] sheath. A 5 French C2 catheter was then directed into the right renal ostium. Right renal angiogram was performed. The single main renal artery was patent. 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 A straight lantern microcatheter and 0.014" soft synchro wire was then inserted and directed into the inferior polar branch. Repeat angiogram was performed in the sub selective location which was significant for multifocal 2-4 mm pseudoaneurysm formation, abnormal truncation and irregularity of the arcuate and interlobular branches, an early arteriovenous shunting. The inferior polar arcuate branch was selected further. Coil embolization was performed with multiple low profile Penumbra Ruby coils ranging from 2-3 mm in diameter. Completion right inferior renal angiogram was performed  which demonstrated appropriate embolization of the targeted vessels without persistent arteriovenous fistula or vessel irregularity. The catheters were removed. The right common femoral artery was then closed with a 6 [place] Angio-Seal device. Distal pulses were unchanged. The patient tolerated the procedure  well was transferred back to the floor in good condition. IMPRESSION: 1. 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 2. Sub selective coil embolization of right inferior polar arcuate artery branch. 3. Successful placement of right internal jugular, 24 French Trialysis catheter with the catheter tip in the right atrium. The catheter is ready for immediate use. Ruthann Cancer, MD [facility] Radiology Electronically Signed   By: Ruthann Cancer M.D.   On: 08/31/2021 09:29    Labs:  CBC: Recent Labs    08/31/21 0230 08/31/21 1529 09/01/21 0951 09/02/21 0151 09/03/21 0214  WBC 11.2* 11.5* 11.3*  --  8.8  HGB 7.1* 7.6* 6.6* 8.4* 8.7*  HCT 22.5* 24.5* 21.4* 26.3* 27.1*  PLT 226 246 246  --  278    COAGS: Recent Labs    08/24/21 1128 08/28/21 0305 08/29/21 0637 08/30/21 0241  INR 1.6* 1.5* 1.7* 1.5*    BMP: Recent Labs    09/01/21 0212 09/02/21 0127 09/03/21 0214 09/04/21 0256  NA 138 137 138 138  K 3.9 3.9 3.5 3.4*  CL 104 103 105 101  CO2 _0 GLUCOSE 103* 98 85 106*  BUN 36* 37* 36* 37*  CALCIUM 8.2* 8.1* 8.1* 8.2*  CREATININE 5.98* 5.82* 5.74* 5.77*  GFRNONAA 9* 10* 10* 10*    LIVER FUNCTION TESTS: Recent Labs    08/23/21 1037 08/24/21 0055 08/25/21 0758 08/26/21 1036 08/27/21 0029 09/01/21 0212 09/02/21 0127 09/03/21 0214 09/04/21 0256  BILITOT 0.3 0.4  --  0.8  --   --   --   --   --   AST 17 14*  --  14*  --   --   --   --   --   ALT 10 10  --  9  --   --   --   --   --   ALKPHOS 66 57  --  56  --   --   --   --   --   PROT 5.6* 4.8*  --  5.0*  --   --   --   --   --   ALBUMIN 2.2* 1.9*   < > 1.9*   < > 2.0* 2.0* 1.9* 1.8*   < > = values in this interval not displayed.    TUMOR MARKERS: No results for input(s): AFPTM, CEA, CA199, CHROMGRNA in the last  8760 hours.  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  All of the questions were answered and there is agreement to proceed Consent signed and in chart.   Thank you for this interesting consult.  I greatly enjoyed meeting CADENCE HASLAM and look forward to participating in their care.  A copy of this report was sent to the requesting provider on this date.  Electronically Signed: Lavonia Drafts, PA-C 09/04/2021, 6:39 AM  I spent a total of 20 Minutes    in face to face in clinical consultation, greater than 50% of which was counseling/coordinating care for bone marrow biopsy

## 2021-09-04 NOTE — Progress Notes (Signed)
Pt stated did not want to take Dilaudid any longer causing some SOB issues after getting medication. Pt still has pain to right shoulder since HD catheter placement.

## 2021-09-04 NOTE — Discharge Instructions (Signed)
Follow with Primary MD in 7 days  ° °Get CBC, CMP,  checked  by Primary MD next visit.  ° ° °Activity: As tolerated with Full fall precautions use walker/cane & assistance as needed ° ° °Disposition Home  ° ° °Diet: Heart Healthy  ° ° °On your next visit with your primary care physician please Get Medicines reviewed and adjusted. ° ° °Please request your Prim.MD to go over all Hospital Tests and Procedure/Radiological results at the follow up, please get all Hospital records sent to your Prim MD by signing hospital release before you go home. ° ° °If you experience worsening of your admission symptoms, develop shortness of breath, life threatening emergency, suicidal or homicidal thoughts you must seek medical attention immediately by calling 911 or calling your MD immediately  if symptoms less severe. ° °You Must read complete instructions/literature along with all the possible adverse reactions/side effects for all the Medicines you take and that have been prescribed to you. Take any new Medicines after you have completely understood and accpet all the possible adverse reactions/side effects.  ° °Do not drive, operating heavy machinery, perform activities at heights, swimming or participation in water activities or provide baby sitting services if your were admitted for syncope or siezures until you have seen by Primary MD or a Neurologist and advised to do so again. ° °Do not drive when taking Pain medications.  ° ° °Do not take more than prescribed Pain, Sleep and Anxiety Medications ° °Special Instructions: If you have smoked or chewed Tobacco  in the last 2 yrs please stop smoking, stop any regular Alcohol  and or any Recreational drug use. ° °Wear Seat belts while driving. ° ° °Please note ° °You were cared for by a hospitalist during your hospital stay. If you have any questions about your discharge medications or the care you received while you were in the hospital after you are discharged, you can call the  unit and asked to speak with the hospitalist on call if the hospitalist that took care of you is not available. Once you are discharged, your primary care physician will handle any further medical issues. Please note that NO REFILLS for any discharge medications will be authorized once you are discharged, as it is imperative that you return to your primary care physician (or establish a relationship with a primary care physician if you do not have one) for your aftercare needs so that they can reassess your need for medications and monitor your lab values. ° °

## 2021-09-05 ENCOUNTER — Inpatient Hospital Stay (HOSPITAL_COMMUNITY): Payer: Commercial Managed Care - PPO

## 2021-09-05 DIAGNOSIS — E854 Organ-limited amyloidosis: Secondary | ICD-10-CM | POA: Diagnosis not present

## 2021-09-05 DIAGNOSIS — N08 Glomerular disorders in diseases classified elsewhere: Secondary | ICD-10-CM | POA: Diagnosis not present

## 2021-09-05 DIAGNOSIS — N179 Acute kidney failure, unspecified: Secondary | ICD-10-CM | POA: Diagnosis not present

## 2021-09-05 LAB — RENAL FUNCTION PANEL
Albumin: 1.8 g/dL — ABNORMAL LOW (ref 3.5–5.0)
Anion gap: 9 (ref 5–15)
BUN: 34 mg/dL — ABNORMAL HIGH (ref 8–23)
CO2: 26 mmol/L (ref 22–32)
Calcium: 8.1 mg/dL — ABNORMAL LOW (ref 8.9–10.3)
Chloride: 101 mmol/L (ref 98–111)
Creatinine, Ser: 5.85 mg/dL — ABNORMAL HIGH (ref 0.61–1.24)
GFR, Estimated: 10 mL/min — ABNORMAL LOW (ref >60)
Glucose, Bld: 87 mg/dL (ref 70–99)
Phosphorus: 3.8 mg/dL (ref 2.5–4.6)
Potassium: 3.6 mmol/L (ref 3.5–5.1)
Sodium: 136 mmol/L (ref 135–145)

## 2021-09-05 LAB — CBC
HCT: 30.3 % — ABNORMAL LOW (ref 39.0–52.0)
Hemoglobin: 9.6 g/dL — ABNORMAL LOW (ref 13.0–17.0)
MCH: 26.8 pg (ref 26.0–34.0)
MCHC: 31.7 g/dL (ref 30.0–36.0)
MCV: 84.6 fL (ref 80.0–100.0)
Platelets: 380 10*3/uL (ref 150–400)
RBC: 3.58 MIL/uL — ABNORMAL LOW (ref 4.22–5.81)
RDW: 17.4 % — ABNORMAL HIGH (ref 11.5–15.5)
WBC: 8.5 10*3/uL (ref 4.0–10.5)
nRBC: 0 % (ref 0.0–0.2)

## 2021-09-05 LAB — BETA 2 MICROGLOBULIN, SERUM: Beta-2 Microglobulin: 11.7 mg/L — ABNORMAL HIGH (ref 0.6–2.4)

## 2021-09-05 IMAGING — CT CT BIOPSY AND ASPIRATION BONE MARROW
1 of 2 series · 15 of 32 positions shown, 19 images · non-contrast
Comparison: none

CLINICAL DATA: Amyloid kidney

EXAM:
CT GUIDED DEEP ILIAC BONE ASPIRATION AND CORE BIOPSY
TECHNIQUE: Patient was placed prone on the CT gantry and limited axial scans
through the pelvis were obtained. Appropriate skin entry site was
identified. Skin site was marked, prepped with chlorhexidine, draped
in usual sterile fashion, and infiltrated locally with 1% lidocaine.

[Series 2: i-spiral 5.0 b40f · axial · 0.59mm/px · z∈[+980,+1078]mm · 15 of 33 slices shown, 19 images]
[im 3/33  soft-tissue]
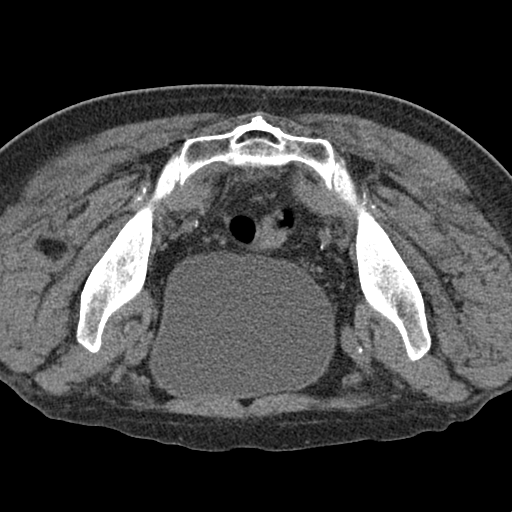
[im 3/33  bone]
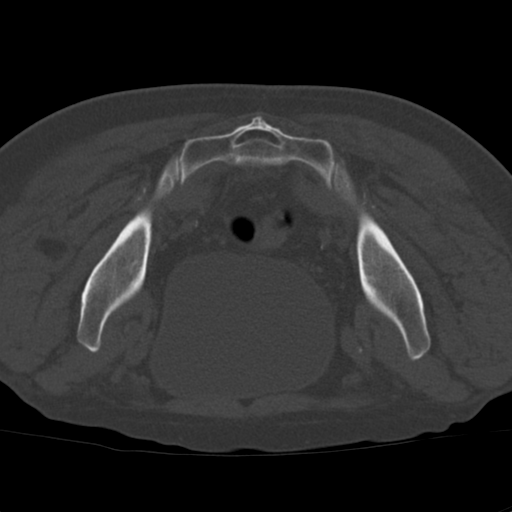
[im 5/33  soft-tissue]
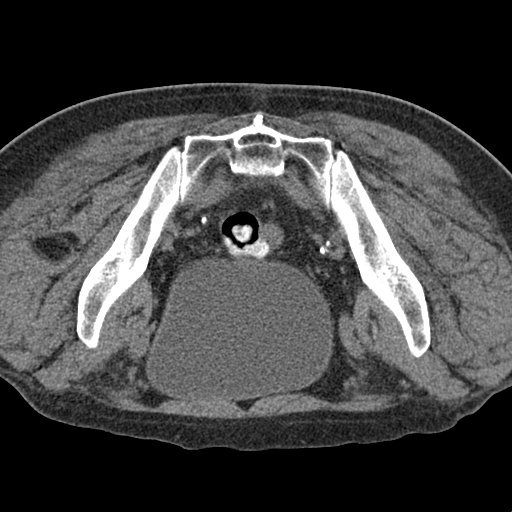
[im 7/33  soft-tissue]
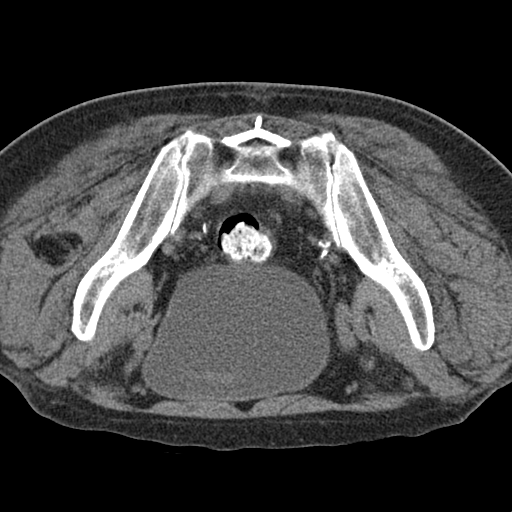
[im 10/33  soft-tissue]
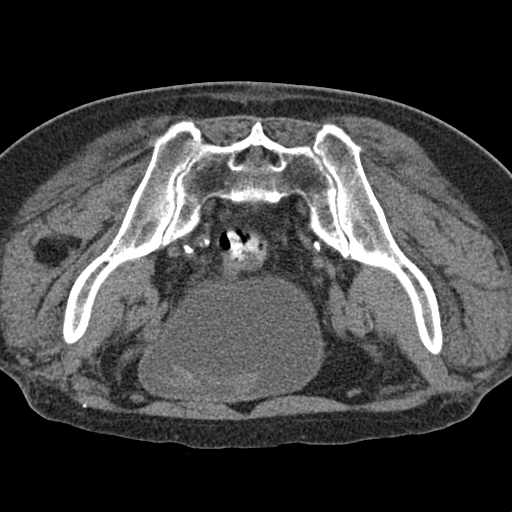
[im 12/33  soft-tissue]
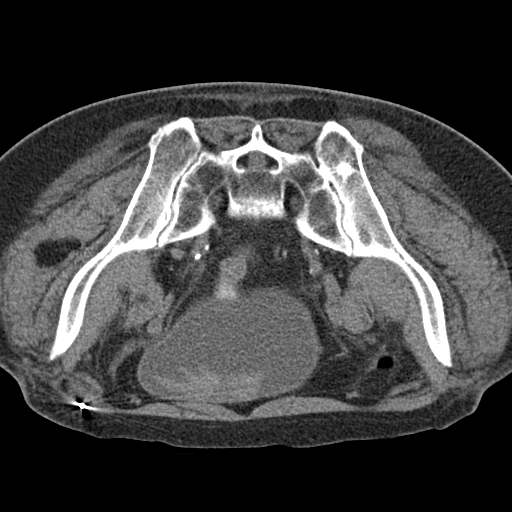
[im 14/33  soft-tissue]
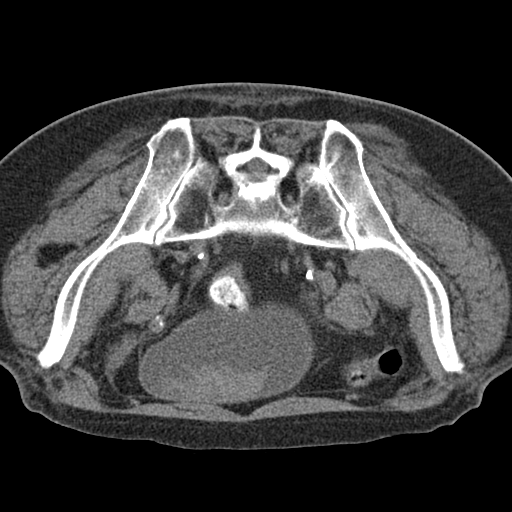
[im 17/33  soft-tissue]
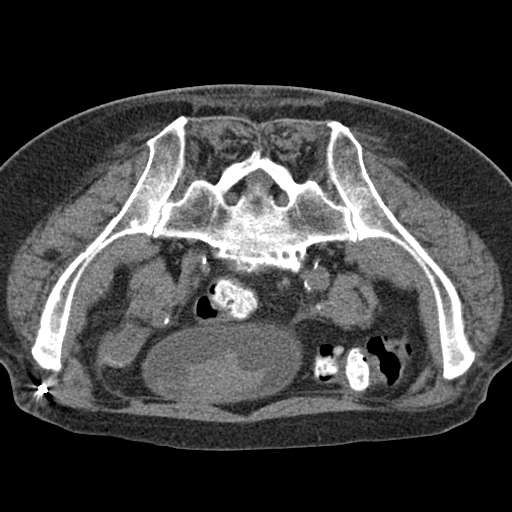
[im 19/33  soft-tissue]
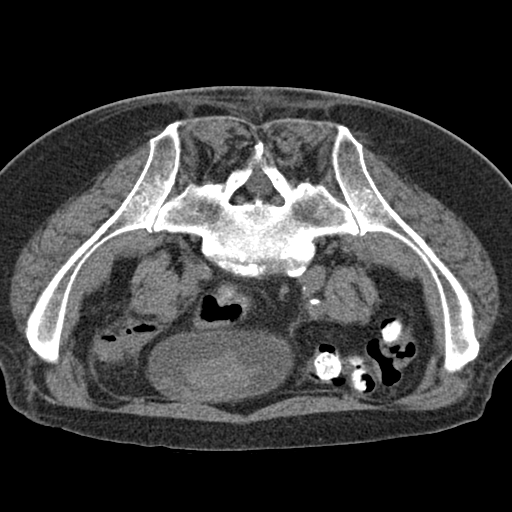
[im 21/33  soft-tissue]
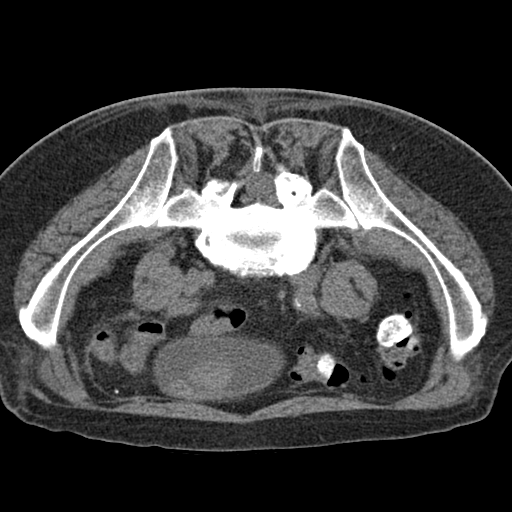
[im 21/33  bone]
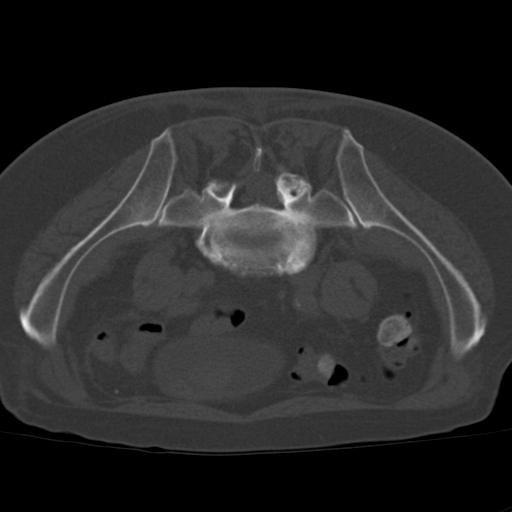
[im 23/33  soft-tissue]
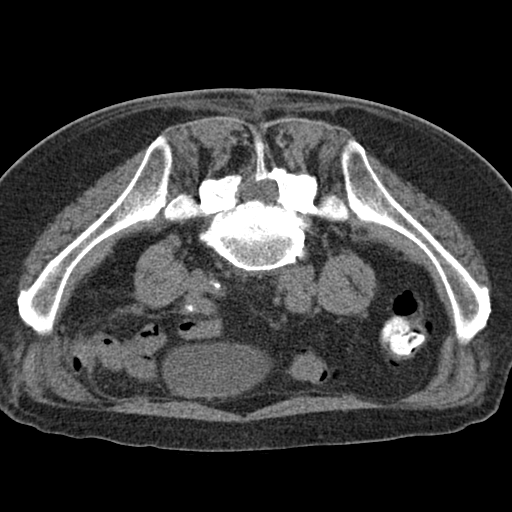
[im 26/33  soft-tissue]
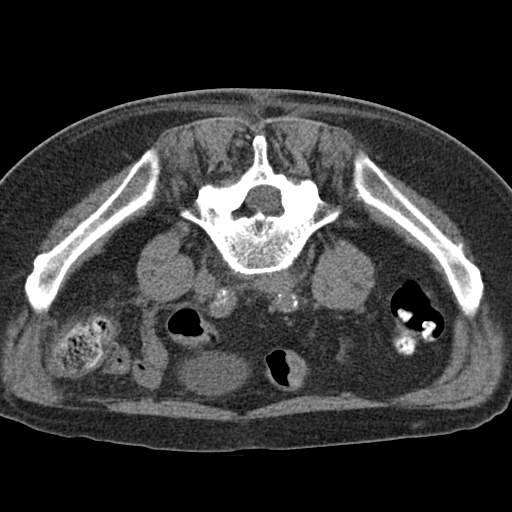
[im 28/33  soft-tissue]
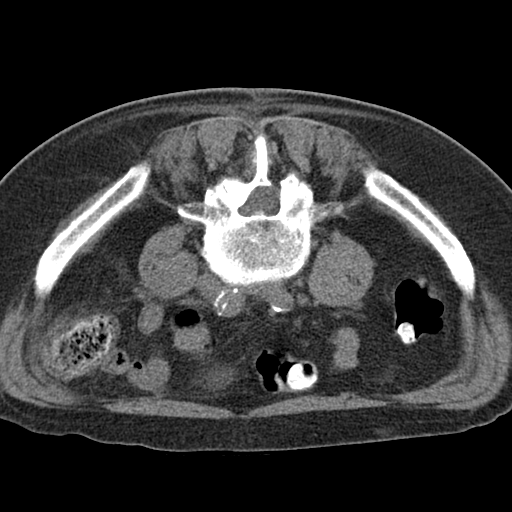
[im 28/33  lung]
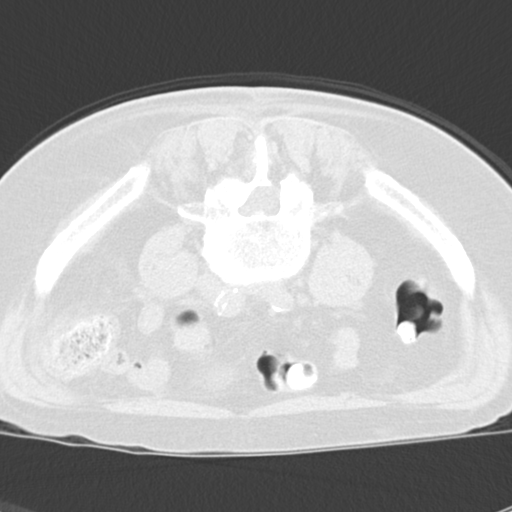
[im 29/33  lung]
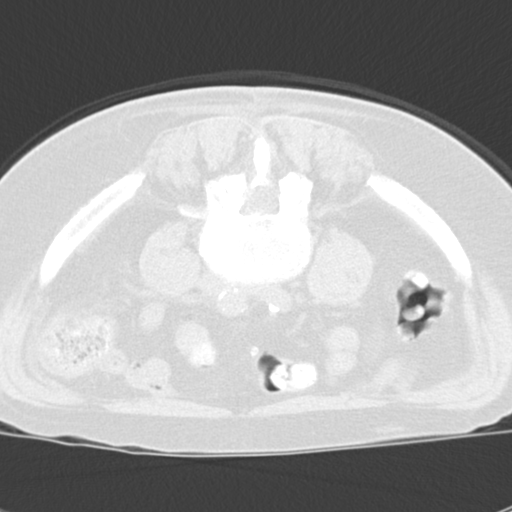
[im 30/33  soft-tissue]
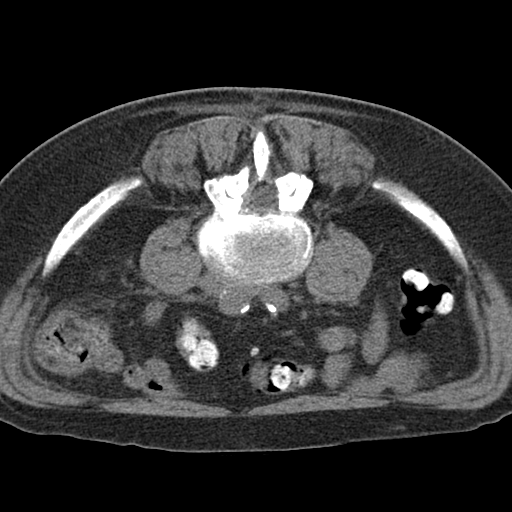
[im 30/33  lung]
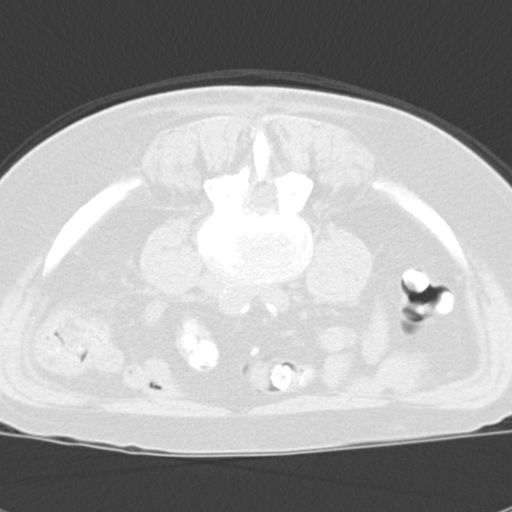
[im 31/33  lung]
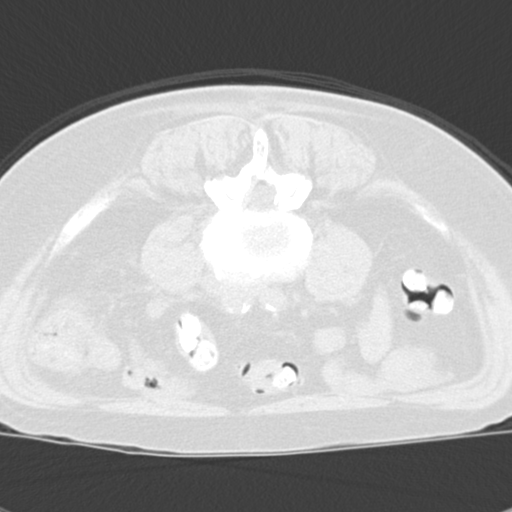

[15 of 32 positions shown; findings below may reference images not displayed]

Intravenous Fentanyl [J2] and Versed 2mg were administered as
conscious sedation during continuous monitoring of the patient's
level of consciousness and physiological / cardiorespiratory status
by the radiology RN, with a total moderate sedation time of 10
minutes.

Under CT fluoroscopic guidance an 11-gauge Cook trocar bone needle
was advanced into the right iliac bone just lateral to the
sacroiliac joint. Once needle tip position was confirmed, core and
aspiration samples were obtained, submitted to pathology for
approval. Post procedure scans show no hematoma or fracture. Patient
tolerated procedure well.

COMPLICATIONS:
COMPLICATIONS
none
IMPRESSION: 1. Technically successful CT guided right iliac bone core and
aspiration biopsy.

## 2021-09-05 MED ORDER — OXYCODONE HCL 5 MG PO TABS
5.0000 mg | ORAL_TABLET | Freq: Four times a day (QID) | ORAL | Status: DC | PRN
  Administered 2021-09-05 – 2021-09-24 (×42): 5 mg via ORAL
  Filled 2021-09-05 (×43): qty 1

## 2021-09-05 MED ORDER — MIDAZOLAM HCL 2 MG/2ML IJ SOLN
INTRAMUSCULAR | Status: DC | PRN
  Administered 2021-09-05: 1 mg via INTRAVENOUS

## 2021-09-05 MED ORDER — FENTANYL CITRATE (PF) 100 MCG/2ML IJ SOLN
INTRAMUSCULAR | Status: DC | PRN
  Administered 2021-09-05: 50 ug via INTRAVENOUS
  Administered 2021-09-05: 25 ug via INTRAVENOUS

## 2021-09-05 MED ORDER — MIDAZOLAM HCL 2 MG/2ML IJ SOLN
INTRAMUSCULAR | Status: DC | PRN
Start: 2021-09-05 — End: 2021-09-05
  Administered 2021-09-05: 1 mg via INTRAVENOUS

## 2021-09-05 MED ORDER — MIDAZOLAM HCL 2 MG/2ML IJ SOLN
INTRAMUSCULAR | Status: AC
  Filled 2021-09-05: qty 4

## 2021-09-05 MED ORDER — FENTANYL CITRATE (PF) 100 MCG/2ML IJ SOLN
INTRAMUSCULAR | Status: AC
  Filled 2021-09-05: qty 4

## 2021-09-05 NOTE — Progress Notes (Signed)
[facility] NEPHROLOGY PROGRESS NOTE  Assessment/ Plan:  #Acute kidney injury, nonoliguric, with history of excessive NSAID's use/BC powders every day.  Peaked creatinine level of 6.95.  CT scan without obstruction or hydronephrosis.  UA with more than 300 protein and microscopic hematuria.  Serology evaluation including ANA, ANCA, C3, C4, SPEP, anti-GBM, double-stranded DNA, hep B, hep C, HIV negative.   -He underwent IR guided kidney biopsy on 9/6 complicated by postbiopsy bleeding. Biopsy: AL amyloidosis, lambda light chain composition (glomeruli, interstitium, arteries, and arterioles). Marked IF, 59% global glomerular sclerosis and marked arteriosclerosis. Final read/EM pending. -He is nonoliguric and creatinine level stable around 5.9 today.  He had right IJ temporary HD catheter placed on 9/8 in IR during embolization of the bleeding artery.  No indication for renal replacement therapy yet however I am concerned about his lack of appetite and possible dysgeusia -Check daily lab, strict ins and out  #Post kidney biopsy bleeding, syncope/acute blood loss anemia: He initially received continuous bladder irrigation.  Because of ongoing bleeding, he underwent embolization of right inferior pole arcuate artery and right IJ temporary HD catheter placement by IR on 9/8.  Still having some hematuria, not on CBI at the moment, urology is following; defer to them.  Monitor CBC.  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  Received multiple units of blood transfusion.  Hemoglobin is stable today.On admission FOBT was negative and treated with IV iron.  #History of nephrolithiasis: Repeat CT scan without any obstruction or hydronephrosis.  Urology is  following  #Metabolic acidosis: Continue oral sodium bicarbonate, stable CO2 level.  Subjective: Seen and examined bedside. No acute events. Just had his BM bx today. Feels as if his appetite is improving.  Objective Vital signs in last 24 hours: Vitals:   09/05/21 0850 09/05/21 0905 09/05/21 0910 09/05/21 1219  BP: (!) 147/82 (!) 143/87 128/71 (!) 130/91  Pulse: 80 80 78 73  Resp: 18 18 19 14   Temp:      TempSrc:      SpO2: 94% 98% 95%   Weight:      Height:       Weight change:   Intake/Output Summary (Last 24 hours) at 09/05/2021 1438 Last data filed at 09/05/2021 1300 Gross per 24 hour  Intake 100 ml  Output 2355 ml  Net -2255 ml       Labs: Basic Metabolic Panel: Recent Labs  Lab 09/03/21 0214 09/04/21 0256 09/05/21 0302  NA 138 138 136  K 3.5 3.4* 3.6  CL 105 101 101  CO2 22 27 26   GLUCOSE 85 106* 87  BUN 36* 37* 34*  CREATININE 5.74* 5.77* 5.85*  CALCIUM 8.1* 8.2* 8.1*  PHOS 5.2* 4.8* 3.8   Liver Function Tests: Recent Labs  Lab 09/03/21 0214 09/04/21 0256 09/05/21 0302  ALBUMIN 1.9* 1.8* 1.8*   No results for input(s): LIPASE, AMYLASE in the last 168 hours.  No results for input(s): AMMONIA in the last 168 hours. CBC: Recent Labs  Lab 08/31/21 1529 09/01/21 0951 09/02/21 0151 09/03/21 0214 09/04/21 0818 09/05/21 0302  WBC 11.5* 11.3*  --  8.8 8.0 8.5  HGB 7.6* 6.6*   < > 8.7* 9.5* 9.6*  HCT 24.5* 21.4*   < > 27.1* 30.0* 30.3*  MCV  85.1 86.3  --  85.0 84.5 84.6  PLT 246 246  --  278 321 380   < > = values in this interval not displayed.   Cardiac Enzymes: No results for input(s): CKTOTAL, CKMB, CKMBINDEX, TROPONINI in the last 168 hours. CBG: No results for input(s): GLUCAP in the last 168 hours.  Iron Studies: No results for input(s): IRON, TIBC, TRANSFERRIN, FERRITIN in the last 72 hours. Studies/Results: CT BONE MARROW BIOPSY & ASPIRATION  Result Date: 09/05/2021 CLINICAL DATA:  Amyloid kidney EXAM: CT GUIDED DEEP ILIAC BONE  ASPIRATION AND CORE BIOPSY TECHNIQUE: Patient was placed prone on the CT gantry and limited axial scans through the pelvis were obtained. Appropriate skin entry site was identified. Skin site was marked, prepped with chlorhexidine, draped in usual sterile fashion, and infiltrated locally with 1% lidocaine. Intravenous Fentanyl 59mg and Versed 257mwere administered as conscious sedation during continuous monitoring of the patient's level of consciousness and physiological / cardiorespiratory status by the radiology RN, with a total moderate sedation time of 10 minutes. Under CT fluoroscopic guidance an 11-gauge Cook trocar bone needle was advanced into the right iliac bone just lateral to the sacroiliac joint. Once needle tip position was confirmed, core and aspiration samples were obtained, submitted to pathology for approval. Post procedure scans show no hematoma or fracture. Patient tolerated procedure well. COMPLICATIONS: COMPLICATIONS none IMPRESSION: 1. Technically successful CT guided right iliac bone core and aspiration biopsy. Electronically Signed   By: D Lucrezia Europe.D.   On: 09/05/2021 10:48    Medications: Infusions:  sodium chloride     sodium chloride 10 mL/hr at 08/27/21 2147   sodium chloride irrigation      Scheduled Medications:  Chlorhexidine Gluconate Cloth  6 each Topical Daily   feeding supplement (NEPRO CARB STEADY)  237 mL Oral TID BM   folic acid  2 mg Oral Daily   melatonin  5 mg Oral QHS   nicotine  14 mg Transdermal Q24H   pantoprazole  40 mg Oral BID AC   sodium bicarbonate  1,300 mg Oral TID   tamsulosin  0.4 mg Oral Daily    have reviewed scheduled and prn medications.  Physical Exam: General: Lying on bed comfortable, not in distress Heart: Regular rate rhythm, S1-S2 normal, no rubs Lungs: Clear bilateral, no wheezing or crackle Abdomen: Soft, nontender, nondistended Extremities: No LE edema. Neurology: Alert, awake, following commands, no asterixis Vascular  Access: Right IJ temporary HD catheter in place.  Almarosa Bohac 09/05/2021,2:38 PM  LOS: 13 days

## 2021-09-05 NOTE — Procedures (Signed)
  Procedure: CT R iliac bone marrow biopsy    EBL:   minimal Complications:  none immediate  See full dictation in [facility] PACS.  D. Paraskevi Funez MD Main # [phone] Pager  [phone]    

## 2021-09-05 NOTE — Progress Notes (Signed)
PROGRESS NOTE        PATIENT DETAILS Name: Caleb Simmons Age: 70 y.o. Sex: male Date of Birth: 1951-07-07 Admit Date: 08/23/2021   Brief Narrative: Patient is a 70 y.o. male with history of nephrolithiasis-presenting with generalized weakness/shortness of breath-found to have AKI and severe normocytic anemia.  Patient was evaluated by nephrology, subsequently underwent right renal biopsy on 9/6 had postrenal biopsy bleeding into the renal collecting system-with clot retention requiring insertion of three-way catheter and CBI. Patient continued to have renal bleeding-on 9/8-IR performed embolization. On 9/10, received 2 units PRBC for hemoglobin of 6.6 and hematuria. On 9/12, renal biopsy showed amyloidosis. Oncology consulted for further management.   Antimicrobial therapy: None  Procedures : 9/06>> ultrasound-guided biopsy of right kidney 9/8>> 1) Right IJ temporary hemodialysis catheter placement 2) Right renal angiogram 3) Selective catheterization and embolization of right inferior pole arcuate artery  Consults: Nephrology, IR, urology, oncology  DVT Prophylaxis : Place and maintain sequential compression device Start: 09/01/21 1335 Place and maintain sequential compression device Start: 08/24/21 0519   Subjective: Saw patient after bone marrow biopsy, reported some neck discomfort, otherwise denied any new complaints.  Has been reporting poor sleep, with some anxiety.   Assessment/Plan:  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 Currently nonoliguric, creatinine level stable around 5 Renal biopsy showed AL amyloidosis Nephrology on board, may need a short HD treatment Has right IJ temporary HD catheter placed on 9/8 in IR Oncology input greatly appreciated, bone marrow biopsy done on 09/05/2021, awaiting result Daily renal panel  Hypotension/presyncope-hematuria-acute urinary retention (clot retention) due to bleeding into the collecting  system of the right kidney post renal biopsy on 9/6-requiring three-way Foley catheter insertion/CBI and embolization by IR on 9/8 Normocytic anemia/acute blood loss anemia Management per urology, Foley catheter discontinued 9/8, he does remain with some mild hematuria He did require multiple transfusions Frequent CBC  Anxiety Continue Xanax while he is in the hospital-is aware that we will not continue Xanax post discharge.  Use trazodone for sleep.  Patient is not interested in starting long-acting medications like SSRI for his anxiety-thinks that once he gets home-he will not require any further medications for his anxiety issues.  Tobacco abuse Continue transdermal nicotine.     Diet: Diet Order             Diet Heart Room service appropriate? Yes; Fluid consistency: Thin  Diet effective now           Diet - low sodium heart healthy                    Code Status: Full code   Family Communication: Discussed with friend at bedside with patient's permission  DVT prophylaxis: SCDs  Disposition Plan: Status is: Inpatient  Remains inpatient appropriate because:Inpatient level of care appropriate due to severity of illness  Dispo: The patient is from: Home              Anticipated d/c is to: Home              Patient currently is not medically stable to d/c.   Difficult to place patient No    Antimicrobial agents: Anti-infectives (From admission, onward)    None        MEDICATIONS: Scheduled Meds:  Chlorhexidine Gluconate Cloth  6 each Topical Daily  feeding supplement (NEPRO CARB STEADY)  237 mL Oral TID BM   folic acid  2 mg Oral Daily   melatonin  5 mg Oral QHS   nicotine  14 mg Transdermal Q24H   pantoprazole  40 mg Oral BID AC   sodium bicarbonate  1,300 mg Oral TID   tamsulosin  0.4 mg Oral Daily   Continuous Infusions:  sodium chloride     sodium chloride 10 mL/hr at 08/27/21 2147   sodium chloride irrigation     PRN  Meds:.acetaminophen, ALPRAZolam, bisacodyl, diphenhydrAMINE, heparin sodium (porcine), lidocaine, ondansetron **OR** ondansetron (ZOFRAN) IV, oxyCODONE, promethazine, technetium albumin aggregated, traMADol, traZODone   PHYSICAL EXAM: Vital signs: Vitals:   09/05/21 0850 09/05/21 0905 09/05/21 0910 09/05/21 1219  BP: (!) 147/82 (!) 143/87 128/71 (!) 130/91  Pulse: 80 80 78 73  Resp: 18 18 19 14   Temp:      TempSrc:      SpO2: 94% 98% 95%   Weight:      Height:       Filed Weights   08/23/21 0816  Weight: 81.6 kg   Body mass index is 25.83 kg/m.   General: NAD  Cardiovascular: S1, S2 present Respiratory: CTAB Abdomen: Soft, nontender, nondistended, bowel sounds present Musculoskeletal: No bilateral pedal edema noted Skin: Normal Psychiatry: Normal mood        I have personally reviewed following labs and imaging studies  LABORATORY DATA: CBC: Recent Labs  Lab 08/31/21 1529 09/01/21 0951 09/02/21 0151 09/03/21 0214 09/04/21 0818 09/05/21 0302  WBC 11.5* 11.3*  --  8.8 8.0 8.5  HGB 7.6* 6.6* 8.4* 8.7* 9.5* 9.6*  HCT 24.5* 21.4* 26.3* 27.1* 30.0* 30.3*  MCV 85.1 86.3  --  85.0 84.5 84.6  PLT 246 246  --  278 321 161    Basic Metabolic Panel: Recent Labs  Lab 09/01/21 0212 09/02/21 0127 09/03/21 0214 09/04/21 0256 09/05/21 0302  NA 138 137 138 138 136  K 3.9 3.9 3.5 3.4* 3.6  CL 104 103 105 101 101  CO2 23 24 22 27 26   GLUCOSE 103* 98 85 106* 87  BUN 36* 37* 36* 37* 34*  CREATININE 5.98* 5.82* 5.74* 5.77* 5.85*  CALCIUM 8.2* 8.1* 8.1* 8.2* 8.1*  PHOS 4.5 5.2* 5.2* 4.8* 3.8    GFR: Estimated Creatinine Clearance: 12 mL/min (A) (by C-G formula based on SCr of 5.85 mg/dL (H)).  Liver Function Tests: Recent Labs  Lab 09/01/21 0212 09/02/21 0127 09/03/21 0214 09/04/21 0256 09/05/21 0302  ALBUMIN 2.0* 2.0* 1.9* 1.8* 1.8*   No results for input(s): LIPASE, AMYLASE in the last 168 hours.  No results for input(s): AMMONIA in the last 168  hours.  Coagulation Profile: Recent Labs  Lab 08/30/21 0241  INR 1.5*    Cardiac Enzymes: No results for input(s): CKTOTAL, CKMB, CKMBINDEX, TROPONINI in the last 168 hours.  BNP (last 3 results) No results for input(s): PROBNP in the last 8760 hours.  Lipid Profile: No results for input(s): CHOL, HDL, LDLCALC, TRIG, CHOLHDL, LDLDIRECT in the last 72 hours.  Thyroid Function Tests: No results for input(s): TSH, T4TOTAL, FREET4, T3FREE, THYROIDAB in the last 72 hours.  Anemia Panel: No results for input(s): VITAMINB12, FOLATE, FERRITIN, TIBC, IRON, RETICCTPCT in the last 72 hours.   Urine analysis:    Component Value Date/Time   COLORURINE STRAW (A) 08/23/2021 1140   APPEARANCEUR CLEAR 08/23/2021 1140   LABSPEC 1.008 08/23/2021 1140   PHURINE 6.0 08/23/2021 1140   GLUCOSEU  50 (A) 08/23/2021 1140   HGBUR SMALL (A) 08/23/2021 1140   BILIRUBINUR NEGATIVE 08/23/2021 1140   KETONESUR NEGATIVE 08/23/2021 1140   PROTEINUR >=300 (A) 08/23/2021 1140   UROBILINOGEN 0.2 06/26/2007 1023   NITRITE NEGATIVE 08/23/2021 1140   LEUKOCYTESUR NEGATIVE 08/23/2021 1140    Sepsis Labs: Lactic Acid, Venous No results found for: LATICACIDVEN  MICROBIOLOGY: No results found for this or any previous visit (from the past 240 hour(s)).   RADIOLOGY STUDIES/RESULTS: CT BONE MARROW BIOPSY & ASPIRATION  Result Date: 09/05/2021 CLINICAL DATA:  Amyloid kidney EXAM: CT GUIDED DEEP ILIAC BONE ASPIRATION AND CORE BIOPSY TECHNIQUE: Patient was placed prone on the CT gantry and limited axial scans through the pelvis were obtained. Appropriate skin entry site was identified. Skin site was marked, prepped with chlorhexidine, draped in usual sterile fashion, and infiltrated locally with 1% lidocaine. Intravenous Fentanyl 52mg and Versed 2102mwere administered as conscious sedation during continuous monitoring of the patient's level of consciousness and physiological / cardiorespiratory status by the  radiology RN, with a total moderate sedation time of 10 minutes. Under CT fluoroscopic guidance an 11-gauge Cook trocar bone needle was advanced into the right iliac bone just lateral to the sacroiliac joint. Once needle tip position was confirmed, core and aspiration samples were obtained, submitted to pathology for approval. Post procedure scans show no hematoma or fracture. Patient tolerated procedure well. COMPLICATIONS: COMPLICATIONS none IMPRESSION: 1. Technically successful CT guided right iliac bone core and aspiration biopsy. Electronically Signed   By: D Lucrezia Europe.D.   On: 09/05/2021 10:48     LOS: 13 days   NkAlma FriendlyMD  [facility]    09/05/2021, 1:35 PM

## 2021-09-06 ENCOUNTER — Inpatient Hospital Stay (HOSPITAL_COMMUNITY): Payer: Commercial Managed Care - PPO

## 2021-09-06 ENCOUNTER — Encounter (HOSPITAL_COMMUNITY): Payer: Self-pay

## 2021-09-06 DIAGNOSIS — N179 Acute kidney failure, unspecified: Secondary | ICD-10-CM | POA: Diagnosis not present

## 2021-09-06 DIAGNOSIS — N08 Glomerular disorders in diseases classified elsewhere: Secondary | ICD-10-CM | POA: Diagnosis not present

## 2021-09-06 DIAGNOSIS — E854 Organ-limited amyloidosis: Secondary | ICD-10-CM | POA: Diagnosis not present

## 2021-09-06 LAB — CBC
HCT: 30.7 % — ABNORMAL LOW (ref 39.0–52.0)
Hemoglobin: 9.6 g/dL — ABNORMAL LOW (ref 13.0–17.0)
MCH: 26.6 pg (ref 26.0–34.0)
MCHC: 31.3 g/dL (ref 30.0–36.0)
MCV: 85 fL (ref 80.0–100.0)
Platelets: 380 10*3/uL (ref 150–400)
RBC: 3.61 MIL/uL — ABNORMAL LOW (ref 4.22–5.81)
RDW: 17.5 % — ABNORMAL HIGH (ref 11.5–15.5)
WBC: 9 10*3/uL (ref 4.0–10.5)
nRBC: 0 % (ref 0.0–0.2)

## 2021-09-06 LAB — MULTIPLE MYELOMA PANEL, SERUM
Albumin SerPl Elph-Mcnc: 2 g/dL — ABNORMAL LOW (ref 2.9–4.4)
Albumin/Glob SerPl: 0.8 (ref 0.7–1.7)
Alpha 1: 0.4 g/dL (ref 0.0–0.4)
Alpha2 Glob SerPl Elph-Mcnc: 0.9 g/dL (ref 0.4–1.0)
B-Globulin SerPl Elph-Mcnc: 0.8 g/dL (ref 0.7–1.3)
Gamma Glob SerPl Elph-Mcnc: 0.4 g/dL (ref 0.4–1.8)
Globulin, Total: 2.6 g/dL (ref 2.2–3.9)
IgA: 135 mg/dL (ref 61–437)
IgG (Immunoglobin G), Serum: 408 mg/dL — ABNORMAL LOW (ref 603–1613)
IgM (Immunoglobulin M), Srm: 106 mg/dL (ref 15–143)
Total Protein ELP: 4.6 g/dL — ABNORMAL LOW (ref 6.0–8.5)

## 2021-09-06 LAB — LACTIC ACID, PLASMA
Lactic Acid, Venous: 0.9 mmol/L (ref 0.5–1.9)
Lactic Acid, Venous: 0.8 mmol/L (ref 0.5–1.9)

## 2021-09-06 LAB — RENAL FUNCTION PANEL
Albumin: 1.8 g/dL — ABNORMAL LOW (ref 3.5–5.0)
Anion gap: 11 (ref 5–15)
BUN: 33 mg/dL — ABNORMAL HIGH (ref 8–23)
CO2: 25 mmol/L (ref 22–32)
Calcium: 8.1 mg/dL — ABNORMAL LOW (ref 8.9–10.3)
Chloride: 102 mmol/L (ref 98–111)
Creatinine, Ser: 5.97 mg/dL — ABNORMAL HIGH (ref 0.61–1.24)
GFR, Estimated: 9 mL/min — ABNORMAL LOW (ref >60)
Glucose, Bld: 95 mg/dL (ref 70–99)
Phosphorus: 4.5 mg/dL (ref 2.5–4.6)
Potassium: 4 mmol/L (ref 3.5–5.1)
Sodium: 138 mmol/L (ref 135–145)

## 2021-09-06 LAB — URINALYSIS, MICROSCOPIC (REFLEX): WBC, UA: >50 WBC/hpf (ref 0–5)

## 2021-09-06 LAB — URINALYSIS, ROUTINE W REFLEX MICROSCOPIC

## 2021-09-06 LAB — PROCALCITONIN: Procalcitonin: 0.76 ng/mL

## 2021-09-06 IMAGING — CR DG CHEST 2V
2 series · 2 of 2 positions shown · non-contrast
Comparison: [DATE]

CLINICAL DATA: Fever

EXAM:
CHEST - 2 VIEW

[chest ap]
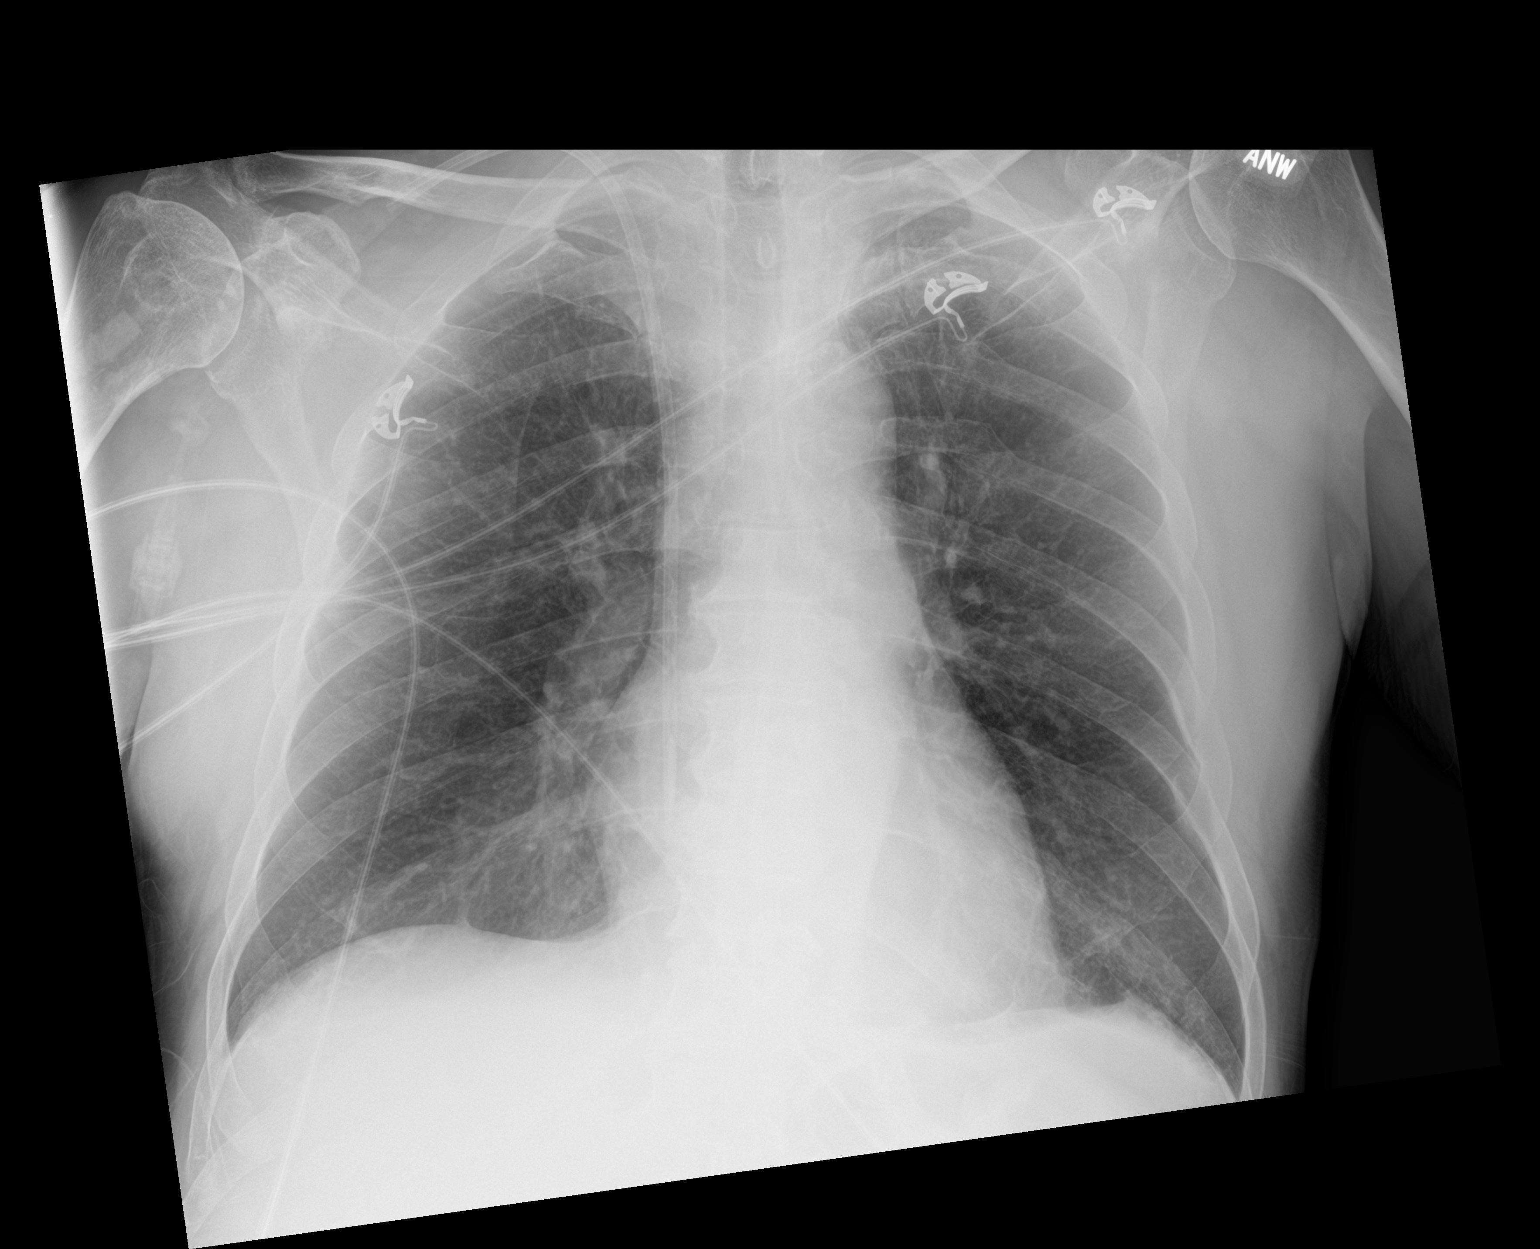

[chest lat]
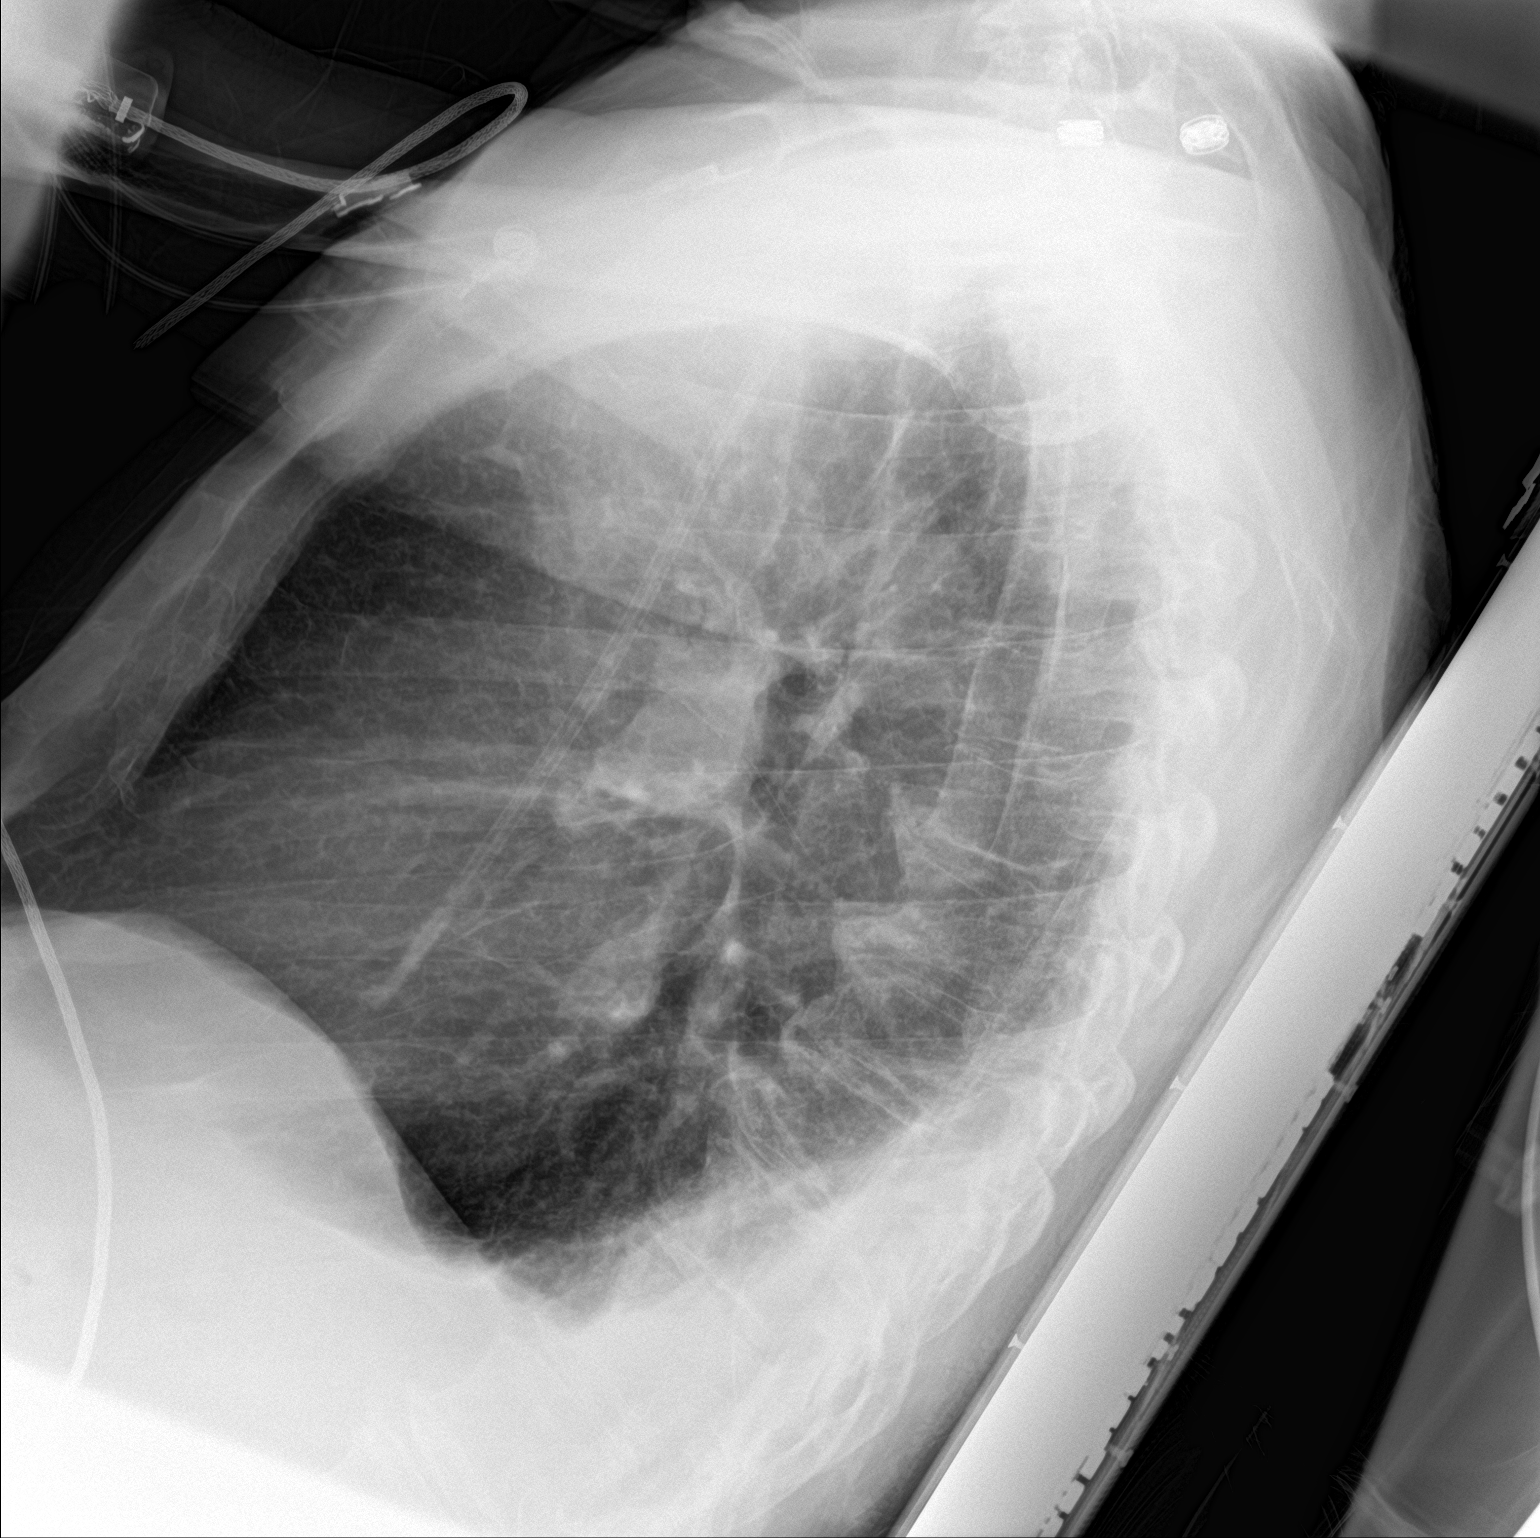

[2 of 2 positions shown; findings below may reference images not displayed]

FINDINGS: Right IJ dialysis catheter tip med right atrium. Midline trachea.
Normal heart size. Left costophrenic angle minimally excluded the
frontal radiograph. Small bilateral pleural effusions. No
pneumothorax. Improved interstitial edema with minimal pulmonary
venous congestion remaining. Persistent mild bibasilar atelectasis.
IMPRESSION: Improved interstitial edema with mild pulmonary venous congestion
remaining.

Small bilateral pleural effusions with adjacent atelectasis.

## 2021-09-06 MED ORDER — SODIUM CHLORIDE 0.9 % IV SOLN
1.0000 g | INTRAVENOUS | Status: DC
  Administered 2021-09-06: 1 g via INTRAVENOUS
  Filled 2021-09-06 (×2): qty 1

## 2021-09-06 MED ORDER — VANCOMYCIN HCL 1750 MG/350ML IV SOLN
1750.0000 mg | Freq: Once | INTRAVENOUS | Status: AC
  Administered 2021-09-06: 1750 mg via INTRAVENOUS
  Filled 2021-09-06: qty 350

## 2021-09-06 NOTE — Progress Notes (Signed)
Pharmacy Antibiotic Note  Caleb Simmons is a 70 y.o. male admitted on 08/23/2021 with sepsis.  Pharmacy has been consulted for vancomycin and cefepime. He is noted with AKI -WBC= 9, tmax= 100.8 -SCr= 5.97; urine output ~ 2536ml over 24 hrs  Plan: -Cefepime 1gm IV q24h -Vancomycin 1750mg  x1 -Check a random vancomycin level in 24 hours -Will follow renal function, cultures and clinical progress    Height: 5\' 10"  (177.8 cm) Weight: 81.6 kg (180 lb) IBW/kg (Calculated) : 73  Temp (24hrs), Avg:99.4 F (37.4 C), Min:98.1 F (36.7 C), Max:100.8 F (38.2 C)  Recent Labs  Lab 09/01/21 0951 09/02/21 0127 09/03/21 0214 09/04/21 0256 09/04/21 0818 09/05/21 0302 09/06/21 0317  WBC 11.3*  --  8.8  --  8.0 8.5 9.0  CREATININE  --  5.82* 5.74* 5.77*  --  5.85* 5.97*    Estimated Creatinine Clearance: 11.7 mL/min (A) (by C-G formula based on SCr of 5.97 mg/dL (H)).    No Known Allergies  Antimicrobials this admission: 9/15 cefepime 9/15 vancomycin  Dose adjustments this admission:   Microbiology results: 9/15 blood x2 9/15 urine  Thank you for allowing pharmacy to be a part of this patient's care.  Hildred Laser, PharmD Clinical Pharmacist **Pharmacist phone directory can now be found on [email or web address] ([email or web address]).  Listed under [place].

## 2021-09-06 NOTE — Progress Notes (Signed)
   09/06/21 1221  Assess: MEWS Score  Temp (!) 100.8 F (38.2 C) (Tylenol given)  BP (!) 160/113  Pulse Rate (!) 104  ECG Heart Rate (!) 104  Resp 14  Level of Consciousness Alert  SpO2 92 %  O2 Device Room Air  Assess: MEWS Score  MEWS Temp 1  MEWS Systolic 0  MEWS Pulse 1  MEWS RR 0  MEWS LOC 0  MEWS Score 2  MEWS Score Color Yellow  Assess: if the MEWS score is Yellow or Red  Were vital signs taken at a resting state? Yes  Focused Assessment Change from prior assessment (see assessment flowsheet)  Early Detection of Sepsis Score *See Row Information* Low  MEWS guidelines implemented *See Row Information* Yes  Escalate  MEWS: Escalate Yellow: discuss with charge nurse/RN and consider discussing with provider and RRT  Notify: Charge Nurse/RN  Name of Charge Nurse/RN Notified [place], RN  Date Charge Nurse/RN Notified 09/06/21  Time Charge Nurse/RN Notified 1221  Notify: Provider  Provider Name/Title Dr. Horris Latino  Date Provider Notified 09/06/21  Time Provider Notified 1225  Notification Type  (Secure chat)  Notification Reason Change in status  Provider response See new orders  Date of Provider Response 09/06/21  Time of Provider Response 1227  Document  Patient Outcome Other (Comment) (See new orders)  Progress note created (see row info) Yes

## 2021-09-06 NOTE — Progress Notes (Signed)
[facility] NEPHROLOGY PROGRESS NOTE  Assessment/ Plan:  #Acute kidney injury, nonoliguric, with history of excessive NSAID's use/BC powders every day.  Peaked creatinine level of 6.95.  CT scan without obstruction or hydronephrosis.  UA with more than 300 protein and microscopic hematuria.  Serology evaluation including ANA, ANCA, C3, C4, SPEP, anti-GBM, double-stranded DNA, hep B, hep C, HIV negative.   -He underwent IR guided kidney biopsy on 9/6 complicated by postbiopsy bleeding. Biopsy: AL amyloidosis, lambda light chain composition (glomeruli, interstitium, arteries, and arterioles). Marked IF, 59% global glomerular sclerosis and marked arteriosclerosis. Final read/EM pending. -He is nonoliguric and creatinine level stable around 5.9 today.  He had right IJ temporary HD catheter placed on 9/8 in IR during embolization of the bleeding artery.  No indication for renal replacement therapy yet however I am concerned about his lack of appetite and possible dysgeusia. If this does not improve and if Cr continues to worsen will attempt HD to see if this helps, Jakhari and I have discussed this over the last few days and today and he is accepting of this -Check daily lab, strict ins and out  #Post kidney biopsy bleeding, syncope/acute blood loss anemia: He initially received continuous bladder irrigation.  Because of ongoing bleeding, he underwent embolization of right inferior pole arcuate artery and right IJ temporary HD catheter placement by IR on 9/8.  Still having some hematuria, not on CBI at the moment, urology is following; defer to them.  Monitor CBC.  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  Received multiple units  of blood transfusion.  Hemoglobin is stable today.On admission FOBT was negative and treated with IV iron.  #History of nephrolithiasis: Repeat CT scan without any obstruction or hydronephrosis.  Urology is following  #Metabolic acidosis: Continue oral sodium bicarbonate, stable CO2 level.  Subjective: Seen and examined bedside. No acute events. He is going to see if his appetite and taste is any better, ordering food now. Uop ~2.5L  Objective Vital signs in last 24 hours: Vitals:   09/06/21 0341 09/06/21 0443 09/06/21 0831 09/06/21 0859  BP:  124/73 (!) 158/85   Pulse:   100   Resp:  18 15   Temp: 99.2 F (37.3 C) 98.1 F (36.7 C) 99.6 F (37.6 C) 99.8 F (37.7 C)  TempSrc: Axillary Oral Oral   SpO2:  94% 96%   Weight:      Height:       Weight change:   Intake/Output Summary (Last 24 hours) at 09/06/2021 0944 Last data filed at 09/06/2021 0800 Gross per 24 hour  Intake 1080 ml  Output 2675 ml  Net -1595 ml       Labs: Basic Metabolic Panel: Recent Labs  Lab 09/04/21 0256 09/05/21 0302 09/06/21 0317  NA 138 136 138  K 3.4* 3.6 4.0  CL 101 101 102  CO2 27 26 25   GLUCOSE 106* 87 95  BUN 37* 34* 33*  CREATININE 5.77* 5.85* 5.97*  CALCIUM 8.2* 8.1* 8.1*  PHOS 4.8* 3.8 4.5   Liver Function Tests: Recent Labs  Lab 09/04/21 0256 09/05/21 0302 09/06/21 0317  ALBUMIN 1.8* 1.8* 1.8*   No results for input(s): LIPASE, AMYLASE in the last 168 hours.  No results  for input(s): AMMONIA in the last 168 hours. CBC: Recent Labs  Lab 09/01/21 0951 09/02/21 0151 09/03/21 0214 09/04/21 0818 09/05/21 0302 09/06/21 0317  WBC 11.3*  --  8.8 8.0 8.5 9.0  HGB 6.6*   < > 8.7* 9.5* 9.6* 9.6*  HCT 21.4*   < > 27.1* 30.0* 30.3* 30.7*  MCV 86.3  --  85.0 84.5 84.6 85.0  PLT 246  --  278 321 380 380   < > = values in this interval not displayed.   Cardiac Enzymes: No results for input(s): CKTOTAL, CKMB, CKMBINDEX, TROPONINI in the last 168 hours. CBG: No results  for input(s): GLUCAP in the last 168 hours.  Iron Studies: No results for input(s): IRON, TIBC, TRANSFERRIN, FERRITIN in the last 72 hours. Studies/Results: CT BONE MARROW BIOPSY & ASPIRATION  Result Date: 09/05/2021 CLINICAL DATA:  Amyloid kidney EXAM: CT GUIDED DEEP ILIAC BONE ASPIRATION AND CORE BIOPSY TECHNIQUE: Patient was placed prone on the CT gantry and limited axial scans through the pelvis were obtained. Appropriate skin entry site was identified. Skin site was marked, prepped with chlorhexidine, draped in usual sterile fashion, and infiltrated locally with 1% lidocaine. Intravenous Fentanyl 69mg and Versed 276mwere administered as conscious sedation during continuous monitoring of the patient's level of consciousness and physiological / cardiorespiratory status by the radiology RN, with a total moderate sedation time of 10 minutes. Under CT fluoroscopic guidance an 11-gauge Cook trocar bone needle was advanced into the right iliac bone just lateral to the sacroiliac joint. Once needle tip position was confirmed, core and aspiration samples were obtained, submitted to pathology for approval. Post procedure scans show no hematoma or fracture. Patient tolerated procedure well. COMPLICATIONS: COMPLICATIONS none IMPRESSION: 1. Technically successful CT guided right iliac bone core and aspiration biopsy. Electronically Signed   By: D Lucrezia Europe.D.   On: 09/05/2021 10:48    Medications: Infusions:  sodium chloride     sodium chloride 10 mL/hr at 08/27/21 2147   sodium chloride irrigation      Scheduled Medications:  Chlorhexidine Gluconate Cloth  6 each Topical Daily   feeding supplement (NEPRO CARB STEADY)  237 mL Oral TID BM   folic acid  2 mg Oral Daily   melatonin  5 mg Oral QHS   nicotine  14 mg Transdermal Q24H   pantoprazole  40 mg Oral BID AC   sodium bicarbonate  1,300 mg Oral TID   tamsulosin  0.4 mg Oral Daily    have reviewed scheduled and prn medications.  Physical  Exam: General: Lying on bed comfortable, not in distress Heart: Regular rate rhythm, S1-S2 normal, no rubs Lungs: Clear bilateral, no wheezing or crackle Abdomen: Soft, nontender, nondistended Extremities: No LE edema. Neurology: Alert, awake, following commands, no asterixis Vascular Access: Right IJ temporary HD catheter in place.  Koben Daman 09/06/2021,9:44 AM  LOS: 14 days

## 2021-09-06 NOTE — Progress Notes (Signed)
Supervising Physician: Arne Cleveland  Patient Status:  Caleb Simmons - In-pt  Chief Complaint: Patient s/p renal biopsy 9/16 with ongoing bleeding. Patient underwent right renal angiogram with embolization of right inferior pole arcuate artery 08/30/21. Patient also had temporary dialysis catheter placed in case of possible need for HD. BM biopsy done in IR 09/05/21  Subjective: Patient in bed, states he feels tired and is having difficulty regulating his temperature. Subjective report of being sweaty. Urinal at the bedside with approximately 200 ml of light red urine.   Allergies: Patient has no known allergies.  Medications: Prior to Admission medications   Medication Sig Start Date End Date Taking? Authorizing Provider  amoxicillin-clavulanate (AUGMENTIN) 875-125 MG tablet Take 1 tablet by mouth 2 (two) times daily. 08/14/21  Yes [provider]  Aspirin-Salicylamide-Caffeine (BC FAST PAIN RELIEF) 650-195-33.3 MG PACK Take 1 Package by mouth daily as needed (pain).   Yes [provider]  promethazine-dextromethorphan (PROMETHAZINE-DM) 6.25-15 MG/5ML syrup Take 5 mLs by mouth every 4 (four) hours as needed for cough. 08/14/21  Yes [provider]     Vital Signs: BP 109/77   Pulse (!) 104   Temp 99.3 F (37.4 C)   Resp 20   Ht 5' 10"  (1.778 m)   Wt 180 lb (81.6 kg)   SpO2 94%   BMI 25.83 kg/m   Physical Exam Constitutional:      General: He is not in acute distress. Cardiovascular:     Comments: Right IJ temporary dialysis catheter - site is clean and dry.  Pulmonary:     Effort: Pulmonary effort is normal.  Genitourinary:    Comments: Patient denies any difficulty with urinating. Urine is light red.  Neurological:     Mental Status: He is alert and oriented to person, place, and time.    Imaging: DG Chest 2 View  Result Date: 09/06/2021 CLINICAL DATA:  Fever EXAM: CHEST - 2 VIEW COMPARISON:  09/03/2021 FINDINGS: Right IJ dialysis catheter tip med  right atrium. Midline trachea. Normal heart size. Left costophrenic angle minimally excluded the frontal radiograph. Small bilateral pleural effusions. No pneumothorax. Improved interstitial edema with minimal pulmonary venous congestion remaining. Persistent mild bibasilar atelectasis. IMPRESSION: Improved interstitial edema with mild pulmonary venous congestion remaining. Small bilateral pleural effusions with adjacent atelectasis. Electronically Signed   By: Abigail Miyamoto M.D.   On: 09/06/2021 13:49   DG Chest Port 1 View  Result Date: 09/03/2021 CLINICAL DATA:  Shortness of breath EXAM: PORTABLE CHEST 1 VIEW COMPARISON:  08/23/2021 FINDINGS: Right dialysis catheter in place with the tip in the right atrium. Heart is normal size. Mild vascular congestion and interstitial prominence throughout the lungs, likely mild edema. No effusions or acute bony abnormality. IMPRESSION: Suspect mild pulmonary edema. Electronically Signed   By: Rolm Baptise M.D.   On: 09/03/2021 09:40   CT BONE MARROW BIOPSY & ASPIRATION  Result Date: 09/05/2021 CLINICAL DATA:  Amyloid kidney EXAM: CT GUIDED DEEP ILIAC BONE ASPIRATION AND CORE BIOPSY TECHNIQUE: Patient was placed prone on the CT gantry and limited axial scans through the pelvis were obtained. Appropriate skin entry site was identified. Skin site was marked, prepped with chlorhexidine, draped in usual sterile fashion, and infiltrated locally with 1% lidocaine. Intravenous Fentanyl 58mg and Versed 234mwere administered as conscious sedation during continuous monitoring of the patient's level of consciousness and physiological / cardiorespiratory status by the radiology RN, with a total moderate sedation time of 10 minutes. Under CT fluoroscopic guidance an  11-gauge Cook trocar bone needle was advanced into the right iliac bone just lateral to the sacroiliac joint. Once needle tip position was confirmed, core and aspiration samples were obtained, submitted to pathology for  approval. Post procedure scans show no hematoma or fracture. Patient tolerated procedure well. COMPLICATIONS: COMPLICATIONS none IMPRESSION: 1. Technically successful CT guided right iliac bone core and aspiration biopsy. Electronically Signed   By: Lucrezia Europe M.D.   On: 09/05/2021 10:48    Labs:  CBC: Recent Labs    09/03/21 0214 09/04/21 0818 09/05/21 0302 09/06/21 0317  WBC 8.8 8.0 8.5 9.0  HGB 8.7* 9.5* 9.6* 9.6*  HCT 27.1* 30.0* 30.3* 30.7*  PLT 278 321 380 380    COAGS: Recent Labs    08/24/21 1128 08/28/21 0305 08/29/21 0637 08/30/21 0241  INR 1.6* 1.5* 1.7* 1.5*    BMP: Recent Labs    09/03/21 0214 09/04/21 0256 09/05/21 0302 09/06/21 0317  NA 138 138 136 138  K 3.5 3.4* 3.6 4.0  CL 105 101 101 102  CO2 22 27 26 25   GLUCOSE 85 106* 87 95  BUN 36* 37* 34* 33*  CALCIUM 8.1* 8.2* 8.1* 8.1*  CREATININE 5.74* 5.77* 5.85* 5.97*  GFRNONAA 10* 10* 10* 9*    LIVER FUNCTION TESTS: Recent Labs    08/23/21 1037 08/24/21 0055 08/25/21 0758 08/26/21 1036 08/27/21 0029 09/03/21 0214 09/04/21 0256 09/05/21 0302 09/06/21 0317  BILITOT 0.3 0.4  --  0.8  --   --   --   --   --   AST 17 14*  --  14*  --   --   --   --   --   ALT 10 10  --  9  --   --   --   --   --   ALKPHOS 66 57  --  56  --   --   --   --   --   PROT 5.6* 4.8*  --  5.0*  --   --   --   --   --   ALBUMIN 2.2* 1.9*   < > 1.9*   < > 1.9* 1.8* 1.8* 1.8*   < > = values in this interval not displayed.    Assessment and Plan:  Patient s/p renal biopsy 9/16 with ongoing bleeding. Patient underwent right renal angiogram with embolization of right inferior pole arcuate artery 08/30/21. Patient also had temporary dialysis catheter placed in case of possible need for HD.  Hemoglobin 9.6. Urine appears to be clearing up and is light red today. Patient denies any significant pain or discomfort. Temporary dialysis catheter site is clean and dry. Continue to trend hemoglobin levels. Creatinine levels stable  at 5.74. If no plans for dialysis please consult IR for removal.   Please call IR with any questions.   Electronically Signed: Soyla Dryer, AGACNP-BC [phone] 09/06/2021, 2:08 PM   I spent a total of 15 Minutes at the the patient's bedside AND on the patient's hospital floor or unit, greater than 50% of which was counseling/coordinating care for renal artery embolization

## 2021-09-06 NOTE — Progress Notes (Signed)
Physical Therapy Treatment Patient Details Name: Caleb Simmons MRN: [id number] DOB: [birth year] Today's Date: 09/06/2021   History of Present Illness Patient is a 70 y.o. male presenting with generalized weakness/shortness of breath-found to have AKI and severe normocytic anemia. Underwent right renal biopsy on 9/6-unfortunately postrenal biopsy-patient developed bleeding into the renal collecting system-with clot retention requiring insertion of three-way catheter and CBI.  Patient continued to have renal bleeding-on 9/8-IR performed embolization. CT R iliac bone marrow biopsy 9/14. Past medical history significant of nephrolithiasis, 1/2 PPD smoker    PT Comments    Pt limited in mobility progression by nausea and high HR and BP this date. His HR increased to 150s while sitting on the commode this date. Due to feeling unwell, he needed more assistance for basic mobility, including minA for transfers and short gait bouts with 1 HHA. Will continue to follow acutely. Expect pt will progress well as he feels better, thus current recommendations remain appropriate.    Recommendations for follow up therapy are one component of a multi-disciplinary discharge planning process, led by the attending physician.  Recommendations may be updated based on patient status, additional functional criteria and insurance authorization.  Follow Up Recommendations  No PT follow up;Supervision - Intermittent     Equipment Recommendations  None recommended by PT    Recommendations for Other Services       Precautions / Restrictions Precautions Precautions: Fall Precaution Comments: has passed out once already Restrictions Weight Bearing Restrictions: No     Mobility  Bed Mobility Overal bed mobility: Modified Independent             General bed mobility comments: HOB elevated, pt using bed rails with supine <> sit transitions.    Transfers Overall transfer level: Needs assistance Equipment used:  1 person hand held assist Transfers: Sit to/from Stand Sit to Stand: Min assist;+2 safety/equipment         General transfer comment: forward flexed body position today with more difficulty performing transfers, 1x from EOB and 1x from toilet.  Ambulation/Gait Ambulation/Gait assistance: Min assist;+2 safety/equipment Gait Distance (Feet): 15 Feet (x2 bouts of ~15 ft) Assistive device: None;1 person hand held assist Gait Pattern/deviations: Step-through pattern;Decreased stride length;Wide base of support;Trunk flexed Gait velocity: decreased Gait velocity interpretation: <1.8 ft/sec, indicate of risk for recurrent falls General Gait Details: Pt not feeling well, displaying forward flexed posture, and needing 1 HHA minA majority of gait for stability this date.   Stairs             Wheelchair Mobility    Modified Rankin (Stroke Patients Only)       Balance Overall balance assessment: Needs assistance Sitting-balance support: Feet supported Sitting balance-Leahy Scale: Good     Standing balance support: Single extremity supported Standing balance-Leahy Scale: Fair Standing balance comment: one hand to steady                            Cognition Arousal/Alertness: Awake/alert Behavior During Therapy: WFL for tasks assessed/performed Overall Cognitive Status: Within Functional Limits for tasks assessed                                        Exercises      General Comments General comments (skin integrity, edema, etc.): HR 120-150s during session, BP was elevated 180/110, improved with activity; RN made  aware      Pertinent Vitals/Pain Pain Assessment: Faces Faces Pain Scale: Hurts a little bit Pain Location: abdomen Pain Descriptors / Indicators: Discomfort Pain Intervention(s): Limited activity within patient's tolerance;Monitored during session;Repositioned    Home Living                      Prior Function             PT Goals (current goals can now be found in the care plan section) Acute Rehab PT Goals Patient Stated Goal: return home and feel better PT Goal Formulation: With patient/family Time For Goal Achievement: 09/15/21 Potential to Achieve Goals: Good Progress towards PT goals: Not progressing toward goals - comment (limited by nausea, BP, and HR today)    Frequency    Min 3X/week      PT Plan Current plan remains appropriate    Co-evaluation PT/OT/SLP Co-Evaluation/Treatment: Yes Reason for Co-Treatment: Other (comment) (activity tolerance for multiple therapy sessions) PT goals addressed during session: Mobility/safety with mobility;Balance OT goals addressed during session: ADL's and self-care      AM-PAC PT "6 Clicks" Mobility   Outcome Measure  Help needed turning from your back to your side while in a flat bed without using bedrails?: None Help needed moving from lying on your back to sitting on the side of a flat bed without using bedrails?: None Help needed moving to and from a bed to a chair (including a wheelchair)?: A Little Help needed standing up from a chair using your arms (e.g., wheelchair or bedside chair)?: A Little Help needed to walk in hospital room?: A Little Help needed climbing 3-5 steps with a railing? : A Little 6 Click Score: 20    End of Session   Activity Tolerance: Treatment limited secondary to medical complications (Comment);Other (comment) (HR, BP, not feeling well) Patient left: in bed;with call bell/phone within reach;with bed alarm set;with family/visitor present Nurse Communication: Mobility status;Other (comment) (vitals) PT Visit Diagnosis: Other abnormalities of gait and mobility (R26.89);Unsteadiness on feet (R26.81);Difficulty in walking, not elsewhere classified (R26.2)     Time: 2583-4621 PT Time Calculation (min) (ACUTE ONLY): 24 min  Charges:  $Therapeutic Activity: 8-22 mins                     [name], PT,  DPT Acute Rehabilitation Services  Pager: [phone] Office: [place] 09/06/2021, 1:48 PM

## 2021-09-06 NOTE — Progress Notes (Signed)
Occupational Therapy Treatment Patient Details Name: Caleb Simmons MRN: [id number] DOB: December 04, 1951 Today's Date: 09/06/2021   History of present illness Patient is a 70 y.o. male presenting with generalized weakness/shortness of breath-found to have AKI and severe normocytic anemia. Underwent right renal biopsy on 9/6-unfortunately postrenal biopsy-patient developed bleeding into the renal collecting system-with clot retention requiring insertion of three-way catheter and CBI.  Patient continued to have renal bleeding-on 9/8-IR performed embolization. Past medical history significant of nephrolithiasis, 1/2 PPD smoker   OT comments  Pt limited by HR, nausea this session. Pt required increased support for transfers and mobility for toilet transfer, peri care. Unable to tolerate grooming in seated or standing position, performed bed level today. RN notified of HR and BP, wife present throughout session. Current recommendations continue to be appropriate but will monitor. OT will continue to follow acutely.   Recommendations for follow up therapy are one component of a multi-disciplinary discharge planning process, led by the attending physician.  Recommendations may be updated based on patient status, additional functional criteria and insurance authorization.    Follow Up Recommendations  Home health OT;Supervision - Intermittent    Equipment Recommendations  3 in 1 bedside commode    Recommendations for Other Services      Precautions / Restrictions Precautions Precautions: Fall Precaution Comments: has passed out once already Restrictions Weight Bearing Restrictions: No       Mobility Bed Mobility Overal bed mobility: Modified Independent                  Transfers Overall transfer level: Needs assistance Equipment used: 1 person hand held assist Transfers: Sit to/from Stand Sit to Stand: Min assist;+2 safety/equipment         General transfer comment: forward flexed  body position today with more difficulty performing transfers    Balance Overall balance assessment: Needs assistance Sitting-balance support: Feet supported Sitting balance-Leahy Scale: Good     Standing balance support: Single extremity supported Standing balance-Leahy Scale: Fair Standing balance comment: one hand to steady                           ADL either performed or assessed with clinical judgement   ADL Overall ADL's : Needs assistance/impaired     Grooming: Set up;Wash/dry hands;Bed level Grooming Details (indicate cue type and reason): unable to tolerate extended sitting or standing at this time for grooming                 Toilet Transfer: Minimal assistance;Ambulation;Regular Toilet;Grab bars Toilet Transfer Details (indicate cue type and reason): more unsteady on his feet today Toileting- Clothing Manipulation and Hygiene: Min guard;Sit to/from stand Toileting - Clothing Manipulation Details (indicate cue type and reason): use of grab bar for steadying     Functional mobility during ADLs: Minimal assistance General ADL Comments: increased nausea and limited session     Vision       Perception     Praxis      Cognition Arousal/Alertness: Awake/alert Behavior During Therapy: WFL for tasks assessed/performed Overall Cognitive Status: Within Functional Limits for tasks assessed                                          Exercises     Shoulder Instructions       General Comments HR 120-146 during session,  BP was elevated 180/110, improved with activity    Pertinent Vitals/ Pain       Pain Assessment: Faces Faces Pain Scale: Hurts a little bit Pain Location: abdomen Pain Descriptors / Indicators: Discomfort Pain Intervention(s): Monitored during session;Repositioned  Home Living                                          Prior Functioning/Environment              Frequency  Min 2X/week         Progress Toward Goals  OT Goals(current goals can now be found in the care plan section)  Progress towards OT goals: Not progressing toward goals - comment (limited by nausea)  Acute Rehab OT Goals Patient Stated Goal: return home and feel better OT Goal Formulation: With patient/family Potential to Achieve Goals: Good  Plan Discharge plan remains appropriate    Co-evaluation    PT/OT/SLP Co-Evaluation/Treatment: Yes Reason for Co-Treatment: Other (comment) (activity tolerance for multiple therapy sessions) PT goals addressed during session: Mobility/safety with mobility;Balance OT goals addressed during session: ADL's and self-care      AM-PAC OT "6 Clicks" Daily Activity     Outcome Measure   Help from another person eating meals?: A Little Help from another person taking care of personal grooming?: A Little Help from another person toileting, which includes using toliet, bedpan, or urinal?: A Little Help from another person bathing (including washing, rinsing, drying)?: A Little Help from another person to put on and taking off regular upper body clothing?: A Little Help from another person to put on and taking off regular lower body clothing?: A Little 6 Click Score: 18    End of Session Equipment Utilized During Treatment: Gait belt  OT Visit Diagnosis: Other abnormalities of gait and mobility (R26.89);Pain;Muscle weakness (generalized) (M62.81) Pain - part of body:  (abdomen)   Activity Tolerance Other (comment) (limited by HR and nausea)   Patient Left in bed;with call bell/phone within reach;with family/visitor present;with nursing/sitter in room;with bed alarm set   Nurse Communication Mobility status;Precautions        Time: 3244-0102 OT Time Calculation (min): 23 min  Charges: OT General Charges $OT Visit: 1 Visit OT Treatments $Self Care/Home Management : 8-22 mins  Jesse Sans OTR/L Acute Rehabilitation Services Pager:  [phone] Office: [place] 09/06/2021, 12:29 PM

## 2021-09-06 NOTE — Progress Notes (Signed)
PROGRESS NOTE        PATIENT DETAILS Name: Caleb Simmons Age: 70 y.o. Sex: male Date of Birth: [birth year] Admit Date: 08/23/2021   Brief Narrative: Patient is a 70 y.o. male with history of nephrolithiasis-presenting with generalized weakness/shortness of breath-found to have AKI and severe normocytic anemia.  Patient was evaluated by nephrology, subsequently underwent right renal biopsy on 9/6 had postrenal biopsy bleeding into the renal collecting system-with clot retention requiring insertion of three-way catheter and CBI. Patient continued to have renal bleeding-on 9/8-IR performed embolization. On 9/10, received 2 units PRBC for hemoglobin of 6.6 and hematuria. On 9/12, renal biopsy showed amyloidosis. Oncology consulted for further management.   Antimicrobial therapy: None  Procedures : 9/06>> ultrasound-guided biopsy of right kidney 9/8>> 1) Right IJ temporary hemodialysis catheter placement 2) Right renal angiogram 3) Selective catheterization and embolization of right inferior pole arcuate artery  Consults: Nephrology, IR, urology, oncology  DVT Prophylaxis : Place and maintain sequential compression device Start: 09/01/21 1335 Place and maintain sequential compression device Start: 08/24/21 0519   Subjective: Met patient this am noted to have significant chills, later noted to spike a temp, denies any chest pain, cough, dysuria, abdominal pain, N/V   Assessment/Plan:  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 Currently nonoliguric, creatinine level stable around 5 Renal biopsy showed AL amyloidosis Nephrology on board, may need a short HD treatment if symptomatic Has right IJ temporary HD catheter placed on 9/8 in IR Oncology input greatly appreciated, bone marrow biopsy done on 09/05/2021, awaiting result Daily renal panel  ?Sepsis Noted to spike temp 100.8 with chills, tachycardic BC X 2 pending Procalcitonin 0.76, will trend LA  WNL UA with significant hematuria, many bacteria, >50 WBC, UC pending CXR unremarkable for infection Started on Cefepime, Vancomycin given his immunocompromised state Monitor closely  Hypotension/presyncope-hematuria-acute urinary retention (clot retention) due to bleeding into the collecting system of the right kidney post renal biopsy on 9/6-requiring three-way Foley catheter insertion/CBI and embolization by IR on 9/8 Normocytic anemia/acute blood loss anemia Management per urology, Foley catheter discontinued 9/8, he does remain with some mild hematuria He did require multiple transfusions Frequent CBC  Anxiety Continue Xanax while he is in the hospital-is aware that we will not continue Xanax post discharge.  Use trazodone for sleep.  Patient is not interested in starting long-acting medications like SSRI for his anxiety-thinks that once he gets home-he will not require any further medications for his anxiety issues.  Tobacco abuse Continue transdermal nicotine.     Diet: Diet Order             Diet Heart Room service appropriate? Yes; Fluid consistency: Thin  Diet effective now           Diet - low sodium heart healthy                    Code Status: Full code   Family Communication: None at bedside  DVT prophylaxis: SCDs  Disposition Plan: Status is: Inpatient  Remains inpatient appropriate because:Inpatient level of care appropriate due to severity of illness  Dispo: The patient is from: Home              Anticipated d/c is to: Home              Patient currently is not medically stable to  d/c.   Difficult to place patient No    Antimicrobial agents: Anti-infectives (From admission, onward)    Start     Dose/Rate Route Frequency Ordered Stop   09/06/21 1400  ceFEPIme (MAXIPIME) 1 g in sodium chloride 0.9 % 100 mL IVPB        1 g 200 mL/hr over 30 Minutes Intravenous Every 24 hours 09/06/21 1307     09/06/21 1400  vancomycin (VANCOREADY) IVPB  1750 mg/350 mL        1,750 mg 175 mL/hr over 120 Minutes Intravenous  Once 09/06/21 1310 09/06/21 1651        MEDICATIONS: Scheduled Meds:  Chlorhexidine Gluconate Cloth  6 each Topical Daily   feeding supplement (NEPRO CARB STEADY)  237 mL Oral TID BM   folic acid  2 mg Oral Daily   melatonin  5 mg Oral QHS   nicotine  14 mg Transdermal Q24H   pantoprazole  40 mg Oral BID AC   sodium bicarbonate  1,300 mg Oral TID   tamsulosin  0.4 mg Oral Daily   Continuous Infusions:  sodium chloride     sodium chloride 10 mL/hr at 08/27/21 2147   ceFEPime (MAXIPIME) IV 1 g (09/06/21 1359)   sodium chloride irrigation     PRN Meds:.acetaminophen, ALPRAZolam, bisacodyl, diphenhydrAMINE, heparin sodium (porcine), lidocaine, ondansetron **OR** ondansetron (ZOFRAN) IV, oxyCODONE, promethazine, technetium albumin aggregated, traMADol, traZODone   PHYSICAL EXAM: Vital signs: Vitals:   09/06/21 1335 09/06/21 1345 09/06/21 1433 09/06/21 1645  BP:  109/77 125/81 (!) 152/78  Pulse:   84 89  Resp:  20 18 17   Temp: 99.3 F (37.4 C)  98.9 F (37.2 C) 99.1 F (37.3 C)  TempSrc:   Axillary Oral  SpO2:  94% 95% 93%  Weight:      Height:       Filed Weights   08/23/21 0816  Weight: 81.6 kg   Body mass index is 25.83 kg/m.   General: NAD  Cardiovascular: S1, S2 present Respiratory: CTAB Abdomen: Soft, nontender, nondistended, bowel sounds present Musculoskeletal: No bilateral pedal edema noted Skin: Normal Psychiatry: Normal mood        I have personally reviewed following labs and imaging studies  LABORATORY DATA: CBC: Recent Labs  Lab 09/01/21 0951 09/02/21 0151 09/03/21 0214 09/04/21 0818 09/05/21 0302 09/06/21 0317  WBC 11.3*  --  8.8 8.0 8.5 9.0  HGB 6.6* 8.4* 8.7* 9.5* 9.6* 9.6*  HCT 21.4* 26.3* 27.1* 30.0* 30.3* 30.7*  MCV 86.3  --  85.0 84.5 84.6 85.0  PLT 246  --  278 321 380 076    Basic Metabolic Panel: Recent Labs  Lab 09/02/21 0127 09/03/21 0214  09/04/21 0256 09/05/21 0302 09/06/21 0317  NA 137 138 138 136 138  K 3.9 3.5 3.4* 3.6 4.0  CL 103 105 101 101 102  CO2 24 22 27 26 25   GLUCOSE 98 85 106* 87 95  BUN 37* 36* 37* 34* 33*  CREATININE 5.82* 5.74* 5.77* 5.85* 5.97*  CALCIUM 8.1* 8.1* 8.2* 8.1* 8.1*  PHOS 5.2* 5.2* 4.8* 3.8 4.5    GFR: Estimated Creatinine Clearance: 11.7 mL/min (A) (by C-G formula based on SCr of 5.97 mg/dL (H)).  Liver Function Tests: Recent Labs  Lab 09/02/21 0127 09/03/21 0214 09/04/21 0256 09/05/21 0302 09/06/21 0317  ALBUMIN 2.0* 1.9* 1.8* 1.8* 1.8*   No results for input(s): LIPASE, AMYLASE in the last 168 hours.  No results for input(s): AMMONIA in the last 168  hours.  Coagulation Profile: No results for input(s): INR, PROTIME in the last 168 hours.   Cardiac Enzymes: No results for input(s): CKTOTAL, CKMB, CKMBINDEX, TROPONINI in the last 168 hours.  BNP (last 3 results) No results for input(s): PROBNP in the last 8760 hours.  Lipid Profile: No results for input(s): CHOL, HDL, LDLCALC, TRIG, CHOLHDL, LDLDIRECT in the last 72 hours.  Thyroid Function Tests: No results for input(s): TSH, T4TOTAL, FREET4, T3FREE, THYROIDAB in the last 72 hours.  Anemia Panel: No results for input(s): VITAMINB12, FOLATE, FERRITIN, TIBC, IRON, RETICCTPCT in the last 72 hours.   Urine analysis:    Component Value Date/Time   COLORURINE RED (A) 09/06/2021 1229   APPEARANCEUR TURBID (A) 09/06/2021 1229   LABSPEC  09/06/2021 1229    TEST NOT REPORTED DUE TO COLOR INTERFERENCE OF URINE PIGMENT   PHURINE  09/06/2021 1229    TEST NOT REPORTED DUE TO COLOR INTERFERENCE OF URINE PIGMENT   GLUCOSEU (A) 09/06/2021 1229    TEST NOT REPORTED DUE TO COLOR INTERFERENCE OF URINE PIGMENT   HGBUR (A) 09/06/2021 1229    TEST NOT REPORTED DUE TO COLOR INTERFERENCE OF URINE PIGMENT   BILIRUBINUR (A) 09/06/2021 1229    TEST NOT REPORTED DUE TO COLOR INTERFERENCE OF URINE PIGMENT   KETONESUR (A) 09/06/2021  1229    TEST NOT REPORTED DUE TO COLOR INTERFERENCE OF URINE PIGMENT   PROTEINUR (A) 09/06/2021 1229    TEST NOT REPORTED DUE TO COLOR INTERFERENCE OF URINE PIGMENT   UROBILINOGEN 0.2 06/26/2007 1023   NITRITE (A) 09/06/2021 1229    TEST NOT REPORTED DUE TO COLOR INTERFERENCE OF URINE PIGMENT   LEUKOCYTESUR (A) 09/06/2021 1229    TEST NOT REPORTED DUE TO COLOR INTERFERENCE OF URINE PIGMENT    Sepsis Labs: Lactic Acid, Venous    Component Value Date/Time   LATICACIDVEN 0.8 09/06/2021 1512    MICROBIOLOGY: No results found for this or any previous visit (from the past 240 hour(s)).   RADIOLOGY STUDIES/RESULTS: DG Chest 2 View  Result Date: 09/06/2021 CLINICAL DATA:  Fever EXAM: CHEST - 2 VIEW COMPARISON:  09/03/2021 FINDINGS: Right IJ dialysis catheter tip med right atrium. Midline trachea. Normal heart size. Left costophrenic angle minimally excluded the frontal radiograph. Small bilateral pleural effusions. No pneumothorax. Improved interstitial edema with minimal pulmonary venous congestion remaining. Persistent mild bibasilar atelectasis. IMPRESSION: Improved interstitial edema with mild pulmonary venous congestion remaining. Small bilateral pleural effusions with adjacent atelectasis. Electronically Signed   By: Abigail Miyamoto M.D.   On: 09/06/2021 13:49   CT BONE MARROW BIOPSY & ASPIRATION  Result Date: 09/05/2021 CLINICAL DATA:  Amyloid kidney EXAM: CT GUIDED DEEP ILIAC BONE ASPIRATION AND CORE BIOPSY TECHNIQUE: Patient was placed prone on the CT gantry and limited axial scans through the pelvis were obtained. Appropriate skin entry site was identified. Skin site was marked, prepped with chlorhexidine, draped in usual sterile fashion, and infiltrated locally with 1% lidocaine. Intravenous Fentanyl 3mg and Versed 242mwere administered as conscious sedation during continuous monitoring of the patient's level of consciousness and physiological / cardiorespiratory status by the  radiology RN, with a total moderate sedation time of 10 minutes. Under CT fluoroscopic guidance an 11-gauge Cook trocar bone needle was advanced into the right iliac bone just lateral to the sacroiliac joint. Once needle tip position was confirmed, core and aspiration samples were obtained, submitted to pathology for approval. Post procedure scans show no hematoma or fracture. Patient tolerated procedure well. COMPLICATIONS: COMPLICATIONS none  IMPRESSION: 1. Technically successful CT guided right iliac bone core and aspiration biopsy. Electronically Signed   By: Lucrezia Europe M.D.   On: 09/05/2021 10:48     LOS: 14 days   Alma Friendly, MD  [facility]    09/06/2021, 5:52 PM

## 2021-09-07 DIAGNOSIS — A401 Sepsis due to streptococcus, group B: Secondary | ICD-10-CM

## 2021-09-07 DIAGNOSIS — E859 Amyloidosis, unspecified: Secondary | ICD-10-CM

## 2021-09-07 DIAGNOSIS — R7881 Bacteremia: Secondary | ICD-10-CM

## 2021-09-07 DIAGNOSIS — N179 Acute kidney failure, unspecified: Secondary | ICD-10-CM | POA: Diagnosis not present

## 2021-09-07 DIAGNOSIS — N390 Urinary tract infection, site not specified: Secondary | ICD-10-CM | POA: Diagnosis not present

## 2021-09-07 DIAGNOSIS — E8581 Light chain (AL) amyloidosis: Secondary | ICD-10-CM | POA: Diagnosis not present

## 2021-09-07 DIAGNOSIS — E854 Organ-limited amyloidosis: Secondary | ICD-10-CM | POA: Diagnosis not present

## 2021-09-07 DIAGNOSIS — B951 Streptococcus, group B, as the cause of diseases classified elsewhere: Secondary | ICD-10-CM

## 2021-09-07 LAB — BLOOD CULTURE ID PANEL (REFLEXED) - BCID2

## 2021-09-07 LAB — RENAL FUNCTION PANEL
Albumin: 1.7 g/dL — ABNORMAL LOW (ref 3.5–5.0)
Anion gap: 13 (ref 5–15)
BUN: 36 mg/dL — ABNORMAL HIGH (ref 8–23)
CO2: 23 mmol/L (ref 22–32)
Calcium: 8 mg/dL — ABNORMAL LOW (ref 8.9–10.3)
Chloride: 98 mmol/L (ref 98–111)
Creatinine, Ser: 6.39 mg/dL — ABNORMAL HIGH (ref 0.61–1.24)
GFR, Estimated: 9 mL/min — ABNORMAL LOW (ref >60)
Glucose, Bld: 89 mg/dL (ref 70–99)
Phosphorus: 5.6 mg/dL — ABNORMAL HIGH (ref 2.5–4.6)
Potassium: 3.9 mmol/L (ref 3.5–5.1)
Sodium: 134 mmol/L — ABNORMAL LOW (ref 135–145)

## 2021-09-07 LAB — CBC
HCT: 30.6 % — ABNORMAL LOW (ref 39.0–52.0)
Hemoglobin: 9.6 g/dL — ABNORMAL LOW (ref 13.0–17.0)
MCH: 26.7 pg (ref 26.0–34.0)
MCHC: 31.4 g/dL (ref 30.0–36.0)
MCV: 85.2 fL (ref 80.0–100.0)
Platelets: 370 10*3/uL (ref 150–400)
RBC: 3.59 MIL/uL — ABNORMAL LOW (ref 4.22–5.81)
RDW: 17.4 % — ABNORMAL HIGH (ref 11.5–15.5)
WBC: 10.9 10*3/uL — ABNORMAL HIGH (ref 4.0–10.5)
nRBC: 0 % (ref 0.0–0.2)

## 2021-09-07 LAB — UPEP/UIFE/LIGHT CHAINS/TP, 24-HR UR
% BETA, Urine: 34.2 %
ALPHA 1 URINE: 9.9 %
Albumin, U: 33.1 %
Alpha 2, Urine: 13.3 %
Free Kappa Lt Chains,Ur: 115.12 mg/L — ABNORMAL HIGH (ref 1.17–86.46)
Free Kappa/Lambda Ratio: 0.26 — ABNORMAL LOW (ref 1.83–14.26)
Free Lambda Lt Chains,Ur: 450.2 mg/L — ABNORMAL HIGH (ref 0.27–15.21)
GAMMA GLOBULIN URINE: 9.5 %
M-SPIKE %, Urine: 23.8 % — ABNORMAL HIGH
M-Spike, Mg/24 Hr: 3229 mg/24 hr — ABNORMAL HIGH
Total Protein, Urine-Ur/day: 13569 mg/24 hr — ABNORMAL HIGH (ref 30–150)
Total Protein, Urine: 452.3 mg/dL
Total Volume: 3000

## 2021-09-07 LAB — URINE CULTURE: Culture: >=100000 — AB

## 2021-09-07 LAB — PROCALCITONIN: Procalcitonin: 1.26 ng/mL

## 2021-09-07 MED ORDER — DEXAMETHASONE 4 MG PO TABS
40.0000 mg | ORAL_TABLET | Freq: Once | ORAL | Status: AC
  Administered 2021-09-08: 40 mg via ORAL
  Filled 2021-09-07: qty 1

## 2021-09-07 MED ORDER — DEXTROSE 5 % IV SOLN
4.0000 10*6.[IU] | Freq: Three times a day (TID) | INTRAVENOUS | Status: AC
  Administered 2021-09-07 – 2021-09-16 (×28): 4 10*6.[IU] via INTRAVENOUS
  Filled 2021-09-07 (×28): qty 4

## 2021-09-07 MED ORDER — PENICILLIN G POTASSIUM 20000000 UNITS IJ SOLR
4.0000 10*6.[IU] | Freq: Three times a day (TID) | INTRAVENOUS | Status: DC
  Filled 2021-09-07 (×4): qty 4

## 2021-09-07 MED ORDER — HEPARIN SODIUM (PORCINE) 1000 UNIT/ML IJ SOLN
INTRAMUSCULAR | Status: AC
  Administered 2021-09-07: 1000 [IU]
  Filled 2021-09-07: qty 3

## 2021-09-07 MED ORDER — DEXAMETHASONE 6 MG PO TABS: 20.0000 mg | ORAL_TABLET | Freq: Once | ORAL | Status: DC

## 2021-09-07 NOTE — Progress Notes (Addendum)
HEMATOLOGY-ONCOLOGY PROGRESS NOTE  SUBJECTIVE: Caleb Simmons was seen during dialysis.  He states that he is not eating very well.  The smell of food makes him feel nauseated.  He otherwise has no other complaints today.  REVIEW OF SYSTEMS:   Constitutional: Denies fevers, chills  Eyes: Denies blurriness of vision Ears, nose, mouth, throat, and face: Denies mucositis or sore throat Respiratory: Denies cough, dyspnea or wheezes Cardiovascular: Denies palpitation, chest discomfort Gastrointestinal: Reports nausea secondary to smell of food Skin: Denies abnormal skin rashes Lymphatics: Denies new lymphadenopathy or easy bruising Neurological:Denies numbness, tingling or new weaknesses Behavioral/Psych: Mood is stable, no new changes  Extremities: No lower extremity edema All other systems were reviewed with the patient and are negative.  I have reviewed the past medical history, past surgical history, social history and family history with the patient and they are unchanged from previous note.   PHYSICAL EXAMINATION: ECOG PERFORMANCE STATUS: 2 - Symptomatic, <50% confined to bed  Vitals:   09/07/21 1033 09/07/21 1206  BP: 120/73 (!) 105/55  Pulse:  77  Resp: 18 12  Temp:  98 F (36.7 C)  SpO2: 93% 98%   Filed Weights   08/23/21 0816  Weight: 81.6 kg    Intake/Output from previous day: 09/15 0701 - 09/16 0700 In: 2341 [P.O.:840; I.V.:1075.8; IV Piggyback:425.1] Out: 2100 [Urine:2100]  GENERAL:alert, no distress and comfortable SKIN: skin color, texture, turgor are normal, no rashes or significant lesions EYES: normal, Conjunctiva are pink and non-injected, sclera clear OROPHARYNX:no exudate, no erythema and lips, buccal mucosa, and tongue normal  LUNGS: clear to auscultation and percussion with normal breathing effort HEART: regular rate & rhythm and no murmurs and no lower extremity edema ABDOMEN:abdomen soft, non-tender and normal bowel sounds NEURO: alert & oriented x 3  with fluent speech, no focal motor/sensory deficits  LABORATORY DATA:  I have reviewed the data as listed CMP Latest Ref Rng & Units 09/07/2021 09/06/2021 09/05/2021  Glucose 70 - 99 mg/dL 89 95 87  BUN 8 - 23 mg/dL 36(H) 33(H) 34(H)  Creatinine 0.61 - 1.24 mg/dL 6.39(H) 5.97(H) 5.85(H)  Sodium 135 - 145 mmol/L 134(L) 138 136  Potassium 3.5 - 5.1 mmol/L 3.9 4.0 3.6  Chloride 98 - 111 mmol/L 98 102 101  CO2 22 - 32 mmol/L 23 25 26   Calcium 8.9 - 10.3 mg/dL 8.0(L) 8.1(L) 8.1(L)  Total Protein 6.5 - 8.1 g/dL - - -  Total Bilirubin 0.3 - 1.2 mg/dL - - -  Alkaline Phos 38 - 126 U/L - - -  AST 15 - 41 U/L - - -  ALT 0 - 44 U/L - - -    Lab Results  Component Value Date   WBC 10.9 (H) 09/07/2021   HGB 9.6 (L) 09/07/2021   HCT 30.6 (L) 09/07/2021   MCV 85.2 09/07/2021   PLT 370 09/07/2021   NEUTROABS 5.8 08/28/2021    CT ABDOMEN PELVIS WO CONTRAST  Result Date: 08/30/2021 CLINICAL DATA:  Retroperitoneal hematoma, follow up s/p random renal bx with perinephric hematoma development EXAM: CT ABDOMEN AND PELVIS WITHOUT CONTRAST TECHNIQUE: Multidetector CT imaging of the abdomen and pelvis was performed following the standard protocol without IV contrast. COMPARISON:  September 6 FINDINGS: Inferior chest: Trace bilateral pleural effusions with right greater than left compressive subsegmental atelectasis. Hepatobiliary: The liver is normal in size without focal abnormality. No intrahepatic or extrahepatic biliary ductal dilation. The gallbladder appears normal. Spleen: Normal in size without focal abnormality. Pancreas: No pancreatic ductal dilatation  or surrounding inflammatory changes. Adrenals/Urinary Tract: Adrenal glands are unremarkable. The kidneys are normal in size. Tiny perinephric hematoma along the right renal lower pole is unchanged. Hyperdense material in the right collecting system and bladder consistent with blood products, grossly similar. Stomach/Bowel: The stomach, small bowel and  large bowel are normal in caliber without abnormal wall thickening or surrounding inflammatory changes. Reproductive: Prostate is unremarkable. Lymphatic: No enlarged lymph nodes in the abdomen or pelvis. Vasculature: The abdominal aorta is normal in caliber. Aortic atherosclerosis. Other: No abdominopelvic ascites. Musculoskeletal: No aggressive osseous lesions. Degenerative changes at L5-S1. Bone island in the left ilium. IMPRESSION: The small perinephric hematoma along the right renal lower pole is stable, and within expected limits after percutaneous biopsy. However, there remains substantial clot burden within the bladder. Follow-up urology recommendations for management. Electronically Signed   By: Albin Felling M.D.   On: 08/30/2021 14:21   CT ABDOMEN PELVIS WO CONTRAST  Result Date: 08/29/2021 CLINICAL DATA:  Post right-sided renal biopsy, now with hematuria and hypotension. EXAM: CT ABDOMEN AND PELVIS WITHOUT CONTRAST TECHNIQUE: Multidetector CT imaging of the abdomen and pelvis was performed following the standard protocol without IV contrast. COMPARISON:  CT abdomen pelvis-08/23/2021; ultrasound-guided right renal biopsy-earlier same day FINDINGS: The lack of intravenous contrast limits the ability to evaluate solid abdominal organs. Lower chest: Limited visualization of the lower thorax demonstrates interval development of trace bilateral effusion with worsening bibasilar heterogeneous/consolidative opacities, right greater than left. Previously identified nonspecific ground-glass opacities within the imaged lung bases is not seen on the present examination though there is mild residual intraseptal thickening. Normal heart size. Trace amount of pericardial fluid, unchanged presumably physiologic. There is diffuse decreased attenuation intra cardiac blood pool suggestive of anemia. Hepatobiliary: Normal hepatic contour. Apparent high density material within the gallbladder could represent biliary  sludge. No definitive gallbladder wall thickening or pericholecystic stranding on this noncontrast examination. No ascites. Pancreas: Normal noncontrast appearance of the pancreas. Spleen: Normal noncontrast appearance of the spleen. Adrenals/Urinary Tract: There is a very tiny (approximately 2.7 x 1.6 x 1.2 cm) perinephric hematoma about the inferior pole of the right kidney (axial image 44, series 3; coronal image 57, series 6), with minimal amount of adjacent perinephric stranding. High-density material is seen within the right renal collecting system and ureter with moderate to large amount of layering high-density material within urinary bladder, findings compatible with hemorrhage into the collecting system and bladder. Mild associated right-sided pelviectasis and ureterectasis. Normal noncontrast appearance of the left kidney. No evidence of left-sided nephrolithiasis or urinary obstruction. Normal noncontrast appearance of the bilateral adrenal glands. Stomach/Bowel: Scattered minimal colonic diverticulosis without evidence of superimposed acute diverticulitis on this noncontrast examination. Normal appearance of the terminal ileum. The appendix is not visualized compatible with provided operative history. No discrete areas of bowel wall thickening on this noncontrast examination. No pneumoperitoneum, pneumatosis or portal venous gas. Vascular/Lymphatic: Moderate amount of atherosclerotic plaque within normal caliber abdominal aorta. Scattered retroperitoneal lymph nodes are numerous though individually not enlarged by size criteria with index left sided periaortic lymph node measuring 0.7 cm in greatest short axis diameter (image 31, series 3), presumably reactive in etiology. No bulky retroperitoneal, mesenteric, pelvic or inguinal lymphadenopathy on this noncontrast examination Reproductive: Dystrophic calcifications within normal sized prostate gland. Trace amount of fluid within the pelvic cul-de-sac.  Other: Small bilateral mesenteric fat containing inguinal hernias, left greater than right. Minimal amount of subcutaneous edema about the midline of the low back. Presumed shrapnel is seen within  the right lower abdominal/pelvic ventral abdominal wall. Musculoskeletal: No acute or aggressive osseous abnormalities. Mild-to-moderate multilevel lumbar spine DDD, worse at L4-L5 and L5-S1 with disc space height loss, endplate irregularity and small posteriorly directed disc osteophyte complexes at these locations. Mild degenerative change the bilateral hips with joint space loss, subchondral sclerosis and osteophytosis, right greater than left. IMPRESSION: 1. Post right-sided renal biopsy complicated by tiny (approximately 2.7 cm) perinephric hematoma and bleeding into the right renal collecting system including moderate to large-sized clot within the urinary bladder and associated mild right-sided pelviectasis and ureterectasis. Consideration for initiation of continuous bladder irrigation could be performed as indicated. 2. Trace bilateral effusions with associated bibasilar opacities, right greater than left, likely atelectasis. 3. Colonic diverticulosis without evidence superimposed acute diverticulitis. 4.  Aortic Atherosclerosis (ICD10-I70.0). Critical Value/emergent results were called by telephone at the time of interpretation on 08/28/2021 at 5:04 pm to provider [facility] , who verbally acknowledged these results. Electronically Signed   By: Sandi Mariscal M.D.   On: 08/29/2021 10:38   DG Chest 2 View  Result Date: 09/06/2021 CLINICAL DATA:  Fever EXAM: CHEST - 2 VIEW COMPARISON:  09/03/2021 FINDINGS: Right IJ dialysis catheter tip med right atrium. Midline trachea. Normal heart size. Left costophrenic angle minimally excluded the frontal radiograph. Small bilateral pleural effusions. No pneumothorax. Improved interstitial edema with minimal pulmonary venous congestion remaining. Persistent mild bibasilar  atelectasis. IMPRESSION: Improved interstitial edema with mild pulmonary venous congestion remaining. Small bilateral pleural effusions with adjacent atelectasis. Electronically Signed   By: Abigail Miyamoto M.D.   On: 09/06/2021 13:49   DG Chest 2 View  Result Date: 08/23/2021 CLINICAL DATA:  Near syncope. EXAM: CHEST - 2 VIEW COMPARISON:  None. FINDINGS: The lungs are clear without focal pneumonia, edema, pneumothorax or pleural effusion. Cardiopericardial silhouette is at upper limits of normal for size. The visualized bony structures of the thorax show no acute abnormality. Telemetry leads overlie the chest. IMPRESSION: No active cardiopulmonary disease. Electronically Signed   By: Misty Stanley M.D.   On: 08/23/2021 09:15   NM Pulmonary Perfusion  Result Date: 08/23/2021 CLINICAL DATA:  PE suspected, shortness of breath EXAM: NUCLEAR MEDICINE PERFUSION LUNG SCAN TECHNIQUE: Perfusion images were obtained in multiple projections after intravenous injection of radiopharmaceutical. Ventilation scans intentionally deferred if perfusion scan and chest x-ray adequate for interpretation during COVID 19 epidemic. RADIOPHARMACEUTICALS:  4.2 mCi Tc-24mMAA IV COMPARISON:  Same-day chest radiographs FINDINGS: Normal, homogeneous pulmonary perfusion. No suspicious filling defects. IMPRESSION: Very low probability examination for pulmonary embolism by modified perfusion only PIOPED criteria (PE absent). Electronically Signed   By: AEddie CandleM.D.   On: 08/23/2021 15:59   IR Angiogram Renal Left Selective  INDICATION: 70year old male with history of acute kidney injury of uncertain etiology status post ultrasound-guided right renal biopsy on 08/28/2021. Since biopsy, the patient has experienced gross hematuria with associated acute anemia.   EXAM: 1. Ultrasound-guided vascular access of the right internal jugular vein. 2. Temporary hemodialysis catheter placement. 3. Ultrasound-guided vascular access of the right  common femoral artery. 4. Selective catheterization and angiography of the right renal artery. 5. Sub selective catheterization angiography of right inferior polar and arcuate artery branches. 6. Coil embolization of right inferior pole arcuate artery branch.   MEDICATIONS: None.   ANESTHESIA/SEDATION: Moderate (conscious) sedation was employed during this procedure. A total of Versed 3 mg and Fentanyl 50 mcg was administered intravenously.   Moderate Sedation Time: 66 minutes. The patient's level of consciousness and  vital signs were monitored continuously by radiology nursing throughout the procedure under my direct supervision.   CONTRAST:  92m OMNIPAQUE IOHEXOL 350 MG/ML SOLN, 537mOMNIPAQUE IOHEXOL 350 MG/ML SOLN   FLUOROSCOPY TIME:  Fluoroscopy Time: 10.6 minutes, (811 mGy).   COMPLICATIONS: None immediate.   PROCEDURE: Informed consent was obtained from the patient following explanation of the procedure, risks, benefits and alternatives. The patient understands, agrees and consents for the procedure. All questions were addressed. A time out was performed prior to the initiation of the procedure. Maximal barrier sterile technique utilized including caps, mask, sterile gowns, sterile gloves, large sterile drape, hand hygiene, and chlorhexidine prep.   Preprocedure ultrasound evaluation of the right internal jugular vein demonstrated a patent and compressible vein free of internal echoes. Procedure was planned. Subdermal Local anesthesia was provided 1% lidocaine. A small skin nick was made. Under direct ultrasound visualization, a 21 gauge micropuncture needle was directed into the internal jugular vein. An image was captured and stored in the permanent record. A micropuncture set was inserted and exchanged for a J wire which was positioned in the inferior vena cava. Serial dilation was performed followed by placement of a 12.5 French, 24 cm Trialysis catheter. The catheter tip was positioned in the right  atrium. Each lumen flushed and aspirated appropriately. The dialysis ports were locked with appropriate volume of heparin dwell. The middle central venous port was then used for sedation for the remainder of the procedure. The catheter was secured with a 0 silk retention suture. A sterile bandage was applied.   Preprocedure ultrasound evaluation of the right groin was performed which demonstrated a patent right common femoral artery. The procedure was planned. Subdermal Local anesthesia was provided with 1% lidocaine. A small skin nick was made. Under direct ultrasound visualization, a 21 gauge micropuncture needle was directed into the common femoral artery. An ultrasound image was captured and stored in the permanent record. A micro puncture set was inserted and a limited right lower extremity angiogram was performed which demonstrated appropriate puncture site for closure device use. A J wire was directed to the abdominal aorta and the micropuncture set was exchanged for a 5 Fr[place] sheath. A 5 French C2 catheter was then directed into the right renal ostium. Right renal angiogram was performed. The single main renal artery was patent. 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   A straight lantern microcatheter and 0.014" soft synchro wire was then inserted and directed into the inferior polar branch. Repeat angiogram was performed in the sub selective location which was significant for multifocal 2-4 mm pseudoaneurysm formation, abnormal truncation and irregularity of the arcuate and interlobular branches, an early arteriovenous shunting. The inferior polar arcuate branch was selected further. Coil embolization was performed with multiple low profile Penumbra Ruby coils ranging from 2-3 mm in  diameter. Completion right inferior renal angiogram was performed which demonstrated appropriate embolization of the targeted vessels without persistent arteriovenous fistula or vessel irregularity.   The catheters were removed. The right common femoral artery was then closed with a 6 FrPakistanngio-Seal device. Distal pulses were unchanged. The patient tolerated the procedure well was transferred back to the floor in good condition.   IMPRESSION: 1. 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 2. Sub selective coil embolization of right inferior polar arcuate artery branch. 3. Successful placement of right internal jugular, 24 French Trialysis catheter with the catheter tip in the right atrium. The catheter is ready for immediate use.   Ruthann Cancer, MD   [facility]   [facility]     Electronically Signed   By: Ruthann Cancer M.D.   On: 08/31/2021 09:29    IR Fluoro Guide CV Line Right  Result Date: 08/31/2021 INDICATION: 70 year old male with history of acute kidney injury of uncertain etiology status post ultrasound-guided right renal biopsy on 08/28/2021. Since biopsy, the patient has experienced gross hematuria with associated acute anemia. EXAM: 1. Ultrasound-guided vascular access of the right internal jugular vein. 2. Temporary hemodialysis catheter placement. 3. Ultrasound-guided vascular access of the right common femoral artery. 4. Selective catheterization and angiography of the right renal artery. 5. Sub selective catheterization angiography of right inferior polar and arcuate artery branches. 6. Coil embolization of right inferior pole arcuate artery branch. MEDICATIONS: None. ANESTHESIA/SEDATION: Moderate (conscious) sedation was employed during this procedure. A total of Versed 3 mg and Fentanyl 50 mcg was  administered intravenously. Moderate Sedation Time: 66 minutes. The patient's level of consciousness and vital signs were monitored continuously by radiology nursing throughout the procedure under my direct supervision. CONTRAST:  81m OMNIPAQUE IOHEXOL 350 MG/ML SOLN, 556mOMNIPAQUE IOHEXOL 350 MG/ML SOLN FLUOROSCOPY TIME:  Fluoroscopy Time: 10.6 minutes, (811 mGy). COMPLICATIONS: None immediate. PROCEDURE: Informed consent was obtained from the patient following explanation of the procedure, risks, benefits and alternatives. The patient understands, agrees and consents for the procedure. All questions were addressed. A time out was performed prior to the initiation of the procedure. Maximal barrier sterile technique utilized including caps, mask, sterile gowns, sterile gloves, large sterile drape, hand hygiene, and chlorhexidine prep. Preprocedure ultrasound evaluation of the right internal jugular vein demonstrated a patent and compressible vein free of internal echoes. Procedure was planned. Subdermal Local anesthesia was provided 1% lidocaine. A small skin nick was made. Under direct ultrasound visualization, a 21 gauge micropuncture needle was directed into the internal jugular vein. An image was captured and stored in the permanent record. A micropuncture set was inserted and exchanged for a J wire which was positioned in the inferior vena cava. Serial dilation was performed followed by placement of a 12.5 French, 24 cm Trialysis catheter. The catheter tip was positioned in the right atrium. Each lumen flushed and aspirated appropriately. The dialysis ports were locked with appropriate volume of heparin dwell. The middle central venous port was then used for sedation for the remainder of the procedure. The catheter was secured with a 0 silk retention suture. A sterile bandage was applied. Preprocedure ultrasound evaluation of the right groin was performed which demonstrated a patent right common femoral  artery. The procedure was planned. Subdermal Local anesthesia was provided with 1% lidocaine. A small skin nick was made. Under direct ultrasound visualization, a 21 gauge micropuncture needle was directed into the common femoral artery. An ultrasound image was captured and stored in the permanent record. A micro puncture set was inserted and a limited right lower extremity angiogram was performed which demonstrated appropriate puncture site for closure device use. A J wire was directed to the abdominal aorta and the micropuncture set was exchanged for a 5 Fr[place]ascular sheath. A 5 French C2 catheter was then directed into the right renal ostium. Right renal angiogram was performed.  The single main renal artery was patent. 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 A straight lantern microcatheter and 0.014" soft synchro wire was then inserted and directed into the inferior polar branch. Repeat angiogram was performed in the sub selective location which was significant for multifocal 2-4 mm pseudoaneurysm formation, abnormal truncation and irregularity of the arcuate and interlobular branches, an early arteriovenous shunting. The inferior polar arcuate branch was selected further. Coil embolization was performed with multiple low profile Penumbra Ruby coils ranging from 2-3 mm in diameter. Completion right inferior renal angiogram was performed which demonstrated appropriate embolization of the targeted vessels without persistent arteriovenous fistula or vessel irregularity. The catheters were removed. The right common femoral artery was then closed with a 6 [place] Angio-Seal device. Distal pulses were unchanged. The patient tolerated the procedure well was transferred back to the floor in good condition.  IMPRESSION: 1. 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 2. Sub selective coil embolization of right inferior polar arcuate artery branch. 3. Successful placement of right internal jugular, 24 French Trialysis catheter with the catheter tip in the right atrium. The catheter is ready for immediate use. Ruthann Cancer, MD [facility] Electronically Signed   By: Ruthann Cancer M.D.   On: 08/31/2021 09:29   IR US Guide Vasc Access Right  Result Date: 08/31/2021 INDICATION: 70 year old male with history of acute kidney injury of uncertain etiology status post ultrasound-guided right renal biopsy on 08/28/2021. Since biopsy, the patient has experienced gross hematuria with associated acute anemia. EXAM: 1. Ultrasound-guided vascular access of the right internal jugular vein. 2. Temporary hemodialysis catheter placement. 3. Ultrasound-guided vascular access of the right common femoral artery. 4. Selective catheterization and angiography of the right renal artery. 5. Sub selective catheterization angiography of right inferior polar and arcuate artery branches. 6. Coil embolization of right inferior pole arcuate artery branch. MEDICATIONS: None. ANESTHESIA/SEDATION: Moderate (conscious) sedation was employed during this procedure. A total of Versed 3 mg and Fentanyl 50 mcg was administered intravenously. Moderate Sedation Time: 66 minutes. The patient's level of consciousness and vital signs were monitored continuously by radiology nursing throughout the procedure under my direct supervision. CONTRAST:  21m OMNIPAQUE IOHEXOL 350 MG/ML SOLN, 514mOMNIPAQUE IOHEXOL 350 MG/ML SOLN FLUOROSCOPY TIME:  Fluoroscopy Time: 10.6 minutes, (811 mGy). COMPLICATIONS: None immediate. PROCEDURE: Informed consent was obtained from the  patient following explanation of the procedure, risks, benefits and alternatives. The patient understands, agrees and consents for the procedure. All questions were addressed. A time out was performed prior to the initiation of the procedure. Maximal barrier sterile technique utilized including caps, mask, sterile gowns, sterile gloves, large sterile drape, hand hygiene, and chlorhexidine prep. Preprocedure ultrasound evaluation of the right internal jugular vein demonstrated a patent and compressible vein free of internal echoes. Procedure was planned. Subdermal Local anesthesia was provided 1% lidocaine. A small skin nick was made. Under direct ultrasound visualization, a 21 gauge micropuncture needle was directed into the internal jugular vein. An image was captured and stored in the permanent record. A micropuncture set was inserted and exchanged for a J wire which was positioned in the inferior vena cava. Serial dilation was performed followed by placement of  a 12.5 [place], 24 cm Trialysis catheter. The catheter tip was positioned in the right atrium. Each lumen flushed and aspirated appropriately. The dialysis ports were locked with appropriate volume of heparin dwell. The middle central venous port was then used for sedation for the remainder of the procedure. The catheter was secured with a 0 silk retention suture. A sterile bandage was applied. Preprocedure ultrasound evaluation of the right groin was performed which demonstrated a patent right common femoral artery. The procedure was planned. Subdermal Local anesthesia was provided with 1% lidocaine. A small skin nick was made. Under direct ultrasound visualization, a 21 gauge micropuncture needle was directed into the common femoral artery. An ultrasound image was captured and stored in the permanent record. A micro puncture set was inserted and a limited right lower extremity angiogram was performed which demonstrated appropriate puncture site for  closure device use. A J wire was directed to the abdominal aorta and the micropuncture set was exchanged for a 5 [place] vascular sheath. A 5 French C2 catheter was then directed into the right renal ostium. Right renal angiogram was performed. The single main renal artery was patent. 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 A straight lantern microcatheter and 0.014" soft synchro wire was then inserted and directed into the inferior polar branch. Repeat angiogram was performed in the sub selective location which was significant for multifocal 2-4 mm pseudoaneurysm formation, abnormal truncation and irregularity of the arcuate and interlobular branches, an early arteriovenous shunting. The inferior polar arcuate branch was selected further. Coil embolization was performed with multiple low profile Penumbra Ruby coils ranging from 2-3 mm in diameter. Completion right inferior renal angiogram was performed which demonstrated appropriate embolization of the targeted vessels without persistent arteriovenous fistula or vessel irregularity. The catheters were removed. The right common femoral artery was then closed with a 6 [place] Angio-Seal device. Distal pulses were unchanged. The patient tolerated the procedure well was transferred back to the floor in good condition. IMPRESSION: 1. 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 2. Sub selective coil embolization of right inferior polar arcuate artery branch. 3. Successful placement of right internal jugular, 24 French Trialysis catheter with the catheter tip in the right atrium. The catheter is ready for immediate use.  Ruthann Cancer, MD [facility] Electronically Signed   By: Ruthann Cancer M.D.   On: 08/31/2021 09:29   IR US Guide Vasc Access Right  Result Date: 08/31/2021 INDICATION: 70 year old male with history of acute kidney injury of uncertain etiology status post ultrasound-guided right renal biopsy on 08/28/2021. Since biopsy, the patient has experienced gross hematuria with associated acute anemia. EXAM: 1. Ultrasound-guided vascular access of the right internal jugular vein. 2. Temporary hemodialysis catheter placement. 3. Ultrasound-guided vascular access of the right common femoral artery. 4. Selective catheterization and angiography of the right renal artery. 5. Sub selective catheterization angiography of right inferior polar and arcuate artery branches. 6. Coil embolization of right inferior pole arcuate artery branch. MEDICATIONS: None. ANESTHESIA/SEDATION: Moderate (conscious) sedation was employed during this procedure. A total of Versed 3 mg and Fentanyl 50 mcg was administered intravenously. Moderate Sedation Time: 66 minutes. The patient's level of consciousness and vital signs  were monitored continuously by radiology nursing throughout the procedure under my direct supervision. CONTRAST:  14m OMNIPAQUE IOHEXOL 350 MG/ML SOLN, 542mOMNIPAQUE IOHEXOL 350 MG/ML SOLN FLUOROSCOPY TIME:  Fluoroscopy Time: 10.6 minutes, (811 mGy). COMPLICATIONS: None immediate. PROCEDURE: Informed consent was obtained from the patient following explanation of the procedure, risks, benefits and alternatives. The patient understands, agrees and consents for the procedure. All questions were addressed. A time out was performed prior to the initiation of the procedure. Maximal barrier sterile technique utilized including caps, mask, sterile gowns, sterile gloves, large sterile drape, hand hygiene, and chlorhexidine prep. Preprocedure ultrasound evaluation of the right  internal jugular vein demonstrated a patent and compressible vein free of internal echoes. Procedure was planned. Subdermal Local anesthesia was provided 1% lidocaine. A small skin nick was made. Under direct ultrasound visualization, a 21 gauge micropuncture needle was directed into the internal jugular vein. An image was captured and stored in the permanent record. A micropuncture set was inserted and exchanged for a J wire which was positioned in the inferior vena cava. Serial dilation was performed followed by placement of a 12.5 French, 24 cm Trialysis catheter. The catheter tip was positioned in the right atrium. Each lumen flushed and aspirated appropriately. The dialysis ports were locked with appropriate volume of heparin dwell. The middle central venous port was then used for sedation for the remainder of the procedure. The catheter was secured with a 0 silk retention suture. A sterile bandage was applied. Preprocedure ultrasound evaluation of the right groin was performed which demonstrated a patent right common femoral artery. The procedure was planned. Subdermal Local anesthesia was provided with 1% lidocaine. A small skin nick was made. Under direct ultrasound visualization, a 21 gauge micropuncture needle was directed into the common femoral artery. An ultrasound image was captured and stored in the permanent record. A micro puncture set was inserted and a limited right lower extremity angiogram was performed which demonstrated appropriate puncture site for closure device use. A J wire was directed to the abdominal aorta and the micropuncture set was exchanged for a 5 [place] sheath. A 5 French C2 catheter was then directed into the right renal ostium. Right renal angiogram was performed. The single main renal artery was patent. About 2 arcuate artery branches in the inferior pole there is abnormal truncation and vessel irregularity with evidence of an early filling arteriovenous fistula in  addition to suggestion of faint filling into the collecting system on delayed imaging. A straight lantern microcatheter and 0.014" soft synchro wire was then inserted and directed into the inferior polar branch. Repeat angiogram was performed in the sub selective location which was significant for multifocal 2-4 mm pseudoaneurysm formation, abnormal truncation and irregularity of the arcuate and interlobular branches, an early arteriovenous shunting. The inferior polar arcuate branch was selected further. Coil embolization was performed with multiple low profile Penumbra Ruby coils ranging from 2-3 mm in diameter. Completion right inferior renal angiogram was performed which demonstrated appropriate embolization of the targeted vessels without persistent arteriovenous fistula or vessel irregularity. The catheters were removed. The right common femoral artery was then closed with a 6 Fr[place]gio-Seal device. Distal pulses were unchanged. The patient tolerated the procedure well was transferred back to the floor in good condition. IMPRESSION: 1. 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 2. Sub selective coil embolization of right inferior polar arcuate artery branch. 3.  Successful placement of right internal jugular, 24 French Trialysis catheter with the catheter tip in the right atrium. The catheter is ready for immediate use. Ruthann Cancer, MD [facility] Radiology Electronically Signed   By: Ruthann Cancer M.D.   On: 08/31/2021 09:29   DG Chest Port 1 View  Result Date: 09/03/2021 CLINICAL DATA:  Shortness of breath EXAM: PORTABLE CHEST 1 VIEW COMPARISON:  08/23/2021 FINDINGS: Right dialysis catheter in place with the tip in the right atrium. Heart is normal size. Mild vascular congestion and interstitial prominence  throughout the lungs, likely mild edema. No effusions or acute bony abnormality. IMPRESSION: Suspect mild pulmonary edema. Electronically Signed   By: Rolm Baptise M.D.   On: 09/03/2021 09:40   CT BONE MARROW BIOPSY & ASPIRATION  Result Date: 09/05/2021 CLINICAL DATA:  Amyloid kidney EXAM: CT GUIDED DEEP ILIAC BONE ASPIRATION AND CORE BIOPSY TECHNIQUE: Patient was placed prone on the CT gantry and limited axial scans through the pelvis were obtained. Appropriate skin entry site was identified. Skin site was marked, prepped with chlorhexidine, draped in usual sterile fashion, and infiltrated locally with 1% lidocaine. Intravenous Fentanyl 78mg and Versed 221mwere administered as conscious sedation during continuous monitoring of the patient's level of consciousness and physiological / cardiorespiratory status by the radiology RN, with a total moderate sedation time of 10 minutes. Under CT fluoroscopic guidance an 11-gauge Cook trocar bone needle was advanced into the right iliac bone just lateral to the sacroiliac joint. Once needle tip position was confirmed, core and aspiration samples were obtained, submitted to pathology for approval. Post procedure scans show no hematoma or fracture. Patient tolerated procedure well. COMPLICATIONS: COMPLICATIONS none IMPRESSION: 1. Technically successful CT guided right iliac bone core and aspiration biopsy. Electronically Signed   By: D Lucrezia Europe.D.   On: 09/05/2021 10:48   ECHOCARDIOGRAM COMPLETE  Result Date: 08/24/2021    ECHOCARDIOGRAM REPORT   Patient Name:   Caleb BRAULTate of Exam: 08/24/2021 Medical Rec #:  [id number]  Height:       70.0 in Accession #:    [id number] Weight:       180.0 lb Date of Birth:  7/08-Oct-1951   BSA:          1.996 m Patient Age:    [age]     BP:           120/69 mmHg Patient Gender: M            HR:           85 bpm. Exam Location:  Inpatient Procedure: 2D Echo, Cardiac Doppler and Color Doppler Indications:    Abnormal EKG   History:        Patient has no prior history of Echocardiogram examinations.                 COPD, Signs/Symptoms:Dyspnea and Weakness, renal disease,                 anemia, elevated troponin; Risk Factors:Current Smoker.  Sonographer:    BrDustin FlockDCS Referring Phys: [id number] Comments: Image acquisition challenging due to COPD. IMPRESSIONS  1. Left ventricular ejection fraction, by estimation, is 55 to 60%. The left ventricle has normal function. The left ventricle has no regional wall motion abnormalities. There is mild concentric left ventricular hypertrophy. Left ventricular diastolic parameters are consistent with Grade I diastolic dysfunction (impaired  relaxation).  2. Right ventricular systolic function is normal. The right ventricular size is normal. Tricuspid regurgitation signal is inadequate for assessing PA pressure.  3. The mitral valve is grossly normal. No evidence of mitral valve regurgitation. No evidence of mitral stenosis.  4. The aortic valve was not well visualized. Aortic valve regurgitation is not visualized. No aortic stenosis is present.  5. The inferior vena cava is normal in size with greater than 50% respiratory variability, suggesting right atrial pressure of 3 mmHg. Comparison(s): No prior Echocardiogram. FINDINGS  Left Ventricle: Left ventricular ejection fraction, by estimation, is 55 to 60%. The left ventricle has normal function. The left ventricle has no regional wall motion abnormalities. The left ventricular internal cavity size was normal in size. There is  mild concentric left ventricular hypertrophy. Left ventricular diastolic parameters are consistent with Grade I diastolic dysfunction (impaired relaxation). Right Ventricle: The right ventricular size is normal. No increase in right ventricular wall thickness. Right ventricular systolic function is normal. Tricuspid regurgitation signal is inadequate for assessing PA pressure. Left Atrium:  Left atrial size was normal in size. Right Atrium: Right atrial size was normal in size. Pericardium: There is no evidence of pericardial effusion. Mitral Valve: The mitral valve is grossly normal. No evidence of mitral valve regurgitation. No evidence of mitral valve stenosis. Tricuspid Valve: The tricuspid valve is normal in structure. Tricuspid valve regurgitation is not demonstrated. No evidence of tricuspid stenosis. Aortic Valve: The aortic valve was not well visualized. Aortic valve regurgitation is not visualized. No aortic stenosis is present. Pulmonic Valve: The pulmonic valve was not well visualized. Pulmonic valve regurgitation is not visualized. Aorta: The aortic root is normal in size and structure. Venous: The inferior vena cava is normal in size with greater than 50% respiratory variability, suggesting right atrial pressure of 3 mmHg. IAS/Shunts: The atrial septum is grossly normal.  LEFT VENTRICLE PLAX 2D LVIDd:         5.80 cm      Diastology LVIDs:         3.60 cm      LV e' medial:    5.55 cm/s LV PW:         1.30 cm      LV E/e' medial:  14.5 LV IVS:        1.30 cm      LV e' lateral:   6.74 cm/s LVOT diam:     2.40 cm      LV E/e' lateral: 12.0 LV SV:         76 LV SV Index:   38 LVOT Area:     4.52 cm  LV Volumes (MOD) LV vol d, MOD A4C: 124.0 ml LV vol s, MOD A4C: 47.8 ml LV SV MOD A4C:     124.0 ml RIGHT VENTRICLE RV Basal diam:  2.80 cm RV S prime:     10.10 cm/s TAPSE (M-mode): 2.9 cm LEFT ATRIUM             Index       RIGHT ATRIUM           Index LA diam:        3.80 cm 1.90 cm/m  RA Area:     13.10 cm LA Vol (A2C):   31.8 ml 15.93 ml/m RA Volume:   29.70 ml  14.88 ml/m LA Vol (A4C):   33.0 ml 16.53 ml/m LA Biplane Vol: 34.9 ml 17.49 ml/m  AORTIC VALVE LVOT Vmax:  98.30 cm/s LVOT Vmean:  60.300 cm/s LVOT VTI:    0.169 m  AORTA Ao Root diam: 3.30 cm MITRAL VALVE MV Area (PHT): 3.85 cm    SHUNTS MV Decel Time: 197 msec    Systemic VTI:  0.17 m MV E velocity: 80.60 cm/s  Systemic  Diam: 2.40 cm MV A velocity: 49.30 cm/s MV E/A ratio:  1.63 Rudean Haskell MD Electronically signed by Rudean Haskell MD Signature Date/Time: 08/24/2021/11:38:40 AM    Final    CT Renal Stone Study  Result Date: 08/23/2021 CLINICAL DATA:  Hematuria, new renal failure EXAM: CT ABDOMEN AND PELVIS WITHOUT CONTRAST TECHNIQUE: Multidetector CT imaging of the abdomen and pelvis was performed following the standard protocol without IV contrast. COMPARISON:  06/26/2007 FINDINGS: Lower chest: There is mild, scattered, nonspecific ground-glass and fine nodularity throughout the included bilateral lung bases (series 5, 4). Hepatobiliary: No solid liver abnormality is seen. No gallstones, gallbladder wall thickening, or biliary dilatation. Pancreas: Unremarkable. No pancreatic ductal dilatation or surrounding inflammatory changes. Spleen: Normal in size without significant abnormality. Adrenals/Urinary Tract: Adrenal glands are unremarkable. Kidneys are normal, without renal calculi, solid lesion, or hydronephrosis. Distended urinary bladder, measuring at least 17.4 cm. Stomach/Bowel: Stomach is within normal limits. Appendix not clearly visualized and may be surgically. No evidence of bowel wall thickening, distention, or inflammatory changes. Descending and sigmoid diverticulosis. Vascular/Lymphatic: Aortic atherosclerosis. No enlarged abdominal or pelvic lymph nodes. Reproductive: No mass or other significant abnormality. Other: Small, fat containing bilateral inguinal hernias no abdominopelvic ascites. Musculoskeletal: No acute or significant osseous findings. IMPRESSION: 1. No evidence of urinary tract calculus or hydronephrosis. 2. Distended urinary bladder, measuring at least 17.4 cm. Correlate for urinary retention. 3. Descending and sigmoid diverticulosis without evidence of acute diverticulitis. 4. Mild, nonspecific scattered ground-glass and fine nodularity throughout the bilateral lung bases, possibly  infectious or inflammatory. Aortic Atherosclerosis (ICD10-I70.0). Electronically Signed   By: Eddie Candle M.D.   On: 08/23/2021 16:04   US BIOPSY (KIDNEY)  Result Date: 08/28/2021 INDICATION: Acute kidney injury of uncertain etiology. Please perform image guided biopsy for tissue diagnostic purposes. EXAM: ULTRASOUND GUIDED RENAL BIOPSY COMPARISON:  CT abdomen and pelvis-08/23/2021 MEDICATIONS: None. ANESTHESIA/SEDATION: Fentanyl 100 mcg IV; Versed 2 mg IV Total Moderate Sedation time: 14 minutes; The patient was continuously monitored during the procedure by the interventional radiology nurse under my direct supervision. COMPLICATIONS: None immediate. PROCEDURE: Informed written consent was obtained from the patient after a discussion of the risks, benefits and alternatives to treatment. The patient understands and consents the procedure. A timeout was performed prior to the initiation of the procedure. Ultrasound scanning was performed of the bilateral flanks. The inferior pole of the right kidney was selected for biopsy due to location and sonographic window. The procedure was planned. The operative site was prepped and draped in the usual sterile fashion. The overlying soft tissues were anesthetized with 1% lidocaine with epinephrine. A 17 gauge core needle biopsy device was advanced into the inferior cortex of the right kidney and 3 core biopsies were obtained under direct ultrasound guidance. Images were saved for documentation purposes. The biopsy device was removed and hemostasis was obtained with manual compression. Post procedural scanning was negative for significant post procedural hemorrhage or additional complication. A dressing was placed. The patient tolerated the procedure well without immediate post procedural complication. IMPRESSION: Technically successful ultrasound guided right renal biopsy. Electronically Signed   By: Sandi Mariscal M.D.   On: 08/28/2021 12:52   IR EMBO ART  VEN HEMORR  LYMPH EXTRAV  INC GUIDE ROADMAPPING  Result Date: 08/31/2021 INDICATION: 70 year old male with history of acute kidney injury of uncertain etiology status post ultrasound-guided right renal biopsy on 08/28/2021. Since biopsy, the patient has experienced gross hematuria with associated acute anemia. EXAM: 1. Ultrasound-guided vascular access of the right internal jugular vein. 2. Temporary hemodialysis catheter placement. 3. Ultrasound-guided vascular access of the right common femoral artery. 4. Selective catheterization and angiography of the right renal artery. 5. Sub selective catheterization angiography of right inferior polar and arcuate artery branches. 6. Coil embolization of right inferior pole arcuate artery branch. MEDICATIONS: None. ANESTHESIA/SEDATION: Moderate (conscious) sedation was employed during this procedure. A total of Versed 3 mg and Fentanyl 50 mcg was administered intravenously. Moderate Sedation Time: 66 minutes. The patient's level of consciousness and vital signs were monitored continuously by radiology nursing throughout the procedure under my direct supervision. CONTRAST:  38m OMNIPAQUE IOHEXOL 350 MG/ML SOLN, 516mOMNIPAQUE IOHEXOL 350 MG/ML SOLN FLUOROSCOPY TIME:  Fluoroscopy Time: 10.6 minutes, (811 mGy). COMPLICATIONS: None immediate. PROCEDURE: Informed consent was obtained from the patient following explanation of the procedure, risks, benefits and alternatives. The patient understands, agrees and consents for the procedure. All questions were addressed. A time out was performed prior to the initiation of the procedure. Maximal barrier sterile technique utilized including caps, mask, sterile gowns, sterile gloves, large sterile drape, hand hygiene, and chlorhexidine prep. Preprocedure ultrasound evaluation of the right internal jugular vein demonstrated a patent and compressible vein free of internal echoes. Procedure was planned. Subdermal Local anesthesia was provided 1%  lidocaine. A small skin nick was made. Under direct ultrasound visualization, a 21 gauge micropuncture needle was directed into the internal jugular vein. An image was captured and stored in the permanent record. A micropuncture set was inserted and exchanged for a J wire which was positioned in the inferior vena cava. Serial dilation was performed followed by placement of a 12.5 French, 24 cm Trialysis catheter. The catheter tip was positioned in the right atrium. Each lumen flushed and aspirated appropriately. The dialysis ports were locked with appropriate volume of heparin dwell. The middle central venous port was then used for sedation for the remainder of the procedure. The catheter was secured with a 0 silk retention suture. A sterile bandage was applied. Preprocedure ultrasound evaluation of the right groin was performed which demonstrated a patent right common femoral artery. The procedure was planned. Subdermal Local anesthesia was provided with 1% lidocaine. A small skin nick was made. Under direct ultrasound visualization, a 21 gauge micropuncture needle was directed into the common femoral artery. An ultrasound image was captured and stored in the permanent record. A micro puncture set was inserted and a limited right lower extremity angiogram was performed which demonstrated appropriate puncture site for closure device use. A J wire was directed to the abdominal aorta and the micropuncture set was exchanged for a 5 [place] sheath. A 5 French C2 catheter was then directed into the right renal ostium. Right renal angiogram was performed. The single main renal artery was patent. 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 A straight lantern microcatheter and 0.014" soft synchro wire was then  inserted and directed into the inferior polar branch. Repeat angiogram was performed in the sub selective location which was significant for multifocal 2-4  mm pseudoaneurysm formation, abnormal truncation and irregularity of the arcuate and interlobular branches, an early arteriovenous shunting. The inferior polar arcuate branch was selected further. Coil embolization was performed with multiple low profile Penumbra Ruby coils ranging from 2-3 mm in diameter. Completion right inferior renal angiogram was performed which demonstrated appropriate embolization of the targeted vessels without persistent arteriovenous fistula or vessel irregularity. The catheters were removed. The right common femoral artery was then closed with a 6 [place] Angio-Seal device. Distal pulses were unchanged. The patient tolerated the procedure well was transferred back to the floor in good condition. IMPRESSION: 1. 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 2. Sub selective coil embolization of right inferior polar arcuate artery branch. 3. Successful placement of right internal jugular, 24 French Trialysis catheter with the catheter tip in the right atrium. The catheter is ready for immediate use. Ruthann Cancer, MD [facility] Radiology Electronically Signed   By: Ruthann Cancer M.D.   On: 08/31/2021 09:29    Surgical Pathology  CASE: [id number]  PATIENT: Caleb Simmons  Bone Marrow Report   Clinical History: Amyloid , right Iliac (BH)   DIAGNOSIS:   BONE MARROW, ASPIRATE, CLOT, CORE:  - Plasma cell myeloma, see comment.   PERIPHERAL BLOOD:  - Normocytic anemia.   COMMENT:   The marrow is normocellular but exhibits increased monoclonal plasma  cells (17% aspirate, 15-20% CD138 immunohistochemistry). The findings  are consistent  with plasma cell myeloma. Congo red is pending and will  be reported in an addendum. There is some atypia in the megakaryocytes  and FISH for MDS was added for completeness.   MICROSCOPIC DESCRIPTION:   PERIPHERAL BLOOD SMEAR: There is a normocytic anemia with occasional  hypochromic cells.  There is no rouleaux formation.  Leukocytes are  present in normal numbers.  Circulating plasma cells are not identified.  Platelets are present in normal numbers.   BONE MARROW ASPIRATE: Spicular and cellular.  Erythroid precursors: Relative decrease in numbers.  No significant  dysplasia.  Granulocytic precursors: Relative increase in numbers.  No significant  dysplasia.  No increase in blasts.  Megakaryocytes: Mild increase in numbers.  Occasional forms with  hypolobated or abnormal nuclei.  Lymphocytes/plasma cells: Plasma cells are increased in numbers (6% by  manual differential counts) with atypical forms (multinucleation, large  forms).  Lymphocytes are not increased.   TOUCH PREPARATIONS: Similar to aspirate smears.   CLOT AND BIOPSY: The core biopsy and clot section are normocellular for  age (30%).  There is a mild myeloid hyperplasia.  Megakaryocytes are  increased in numbers with scattered atypical forms. CD138  immunohistochemistry reveals increased plasma cells (15-20%) which are  scattered and with small clusters. By light chain in situ hybridization  the plasma cells are lambda restricted.   IRON STAIN: Iron stains are performed on a bone marrow aspirate or touch  imprint smear and section of clot. The controls stained appropriately.        Storage Iron: Present       Ring Sideroblasts: Absent   ADDITIONAL DATA/TESTING: Cytogenetics, including FISH for myeloma and  MDS, was ordered and will be reported in an addendum.   CELL COUNT DATA:   Bone Marrow count performed on 500 cells shows:  Blasts:   0%   Myeloid:  66%  Promyelocytes: 0%   Erythroid:     11%  Myelocytes:     8%   Lymphocytes:   6%  Metamyelocytes:     1%   Plasma cells:  17%  Bands:    8%  Neutrophils:   41%  M:E ratio:     6.0  Eosinophils:   8%  Basophils:     0%  Monocytes:     0%   Lab Data: CBC performed on 09/05/21 shows:  WBC: 8.5 k/uL  Neutrophils:   57%  Hgb: 9.6 g/dL  Lymphocytes:   27%  HCT: 30.3 %    Monocytes:     8%  MCV: 84.6 fL   Eosinophils:   6%  RDW: 17.4 %    Basophils:     2%  PLT: 380 k/uL    ASSESSMENT AND PLAN: 1.  AL amyloidosis/plasma cell myeloma 2.  Anemia secondary to renal insufficiency, iron deficiency, and folate deficiency 3.  Acute kidney injury secondary to #1 4.  History of nephrolithiasis 5.  Streptococcus agalactiae bacteremia diagnosed 09/07/2021   -Discussed bone marrow biopsy results with the patient.  Bone marrow biopsy consistent with plasma cell myeloma.  He had a previous renal biopsy which showed AL amyloidosis, lambda light chain composition. -UPEP is still pending.  Serum kappa free light chain elevated at 93.4, lambda free light chain elevated at 3991, kappa, lambda light chain ratio 0.02.  -He has worsening renal function today and symptoms that suggest that he is uremic.  He has been started on hemodialysis today. -Discharge plan is unclear at this time.  Given this, will likely need to get him started on Velcade sooner rather than later.  Will begin to plan for inpatient Velcade to be administered next week. -His hemoglobin remained stable.  He is status post 4 doses of IV ferric gluconate.  He is also receiving folic acid.  Transfuse for hemoglobin less than 7.5. -He has been started on IV antibiotics for bacteremia.  We will continue to monitor for improvement/resolution of infection.   LOS: 15 days   Mikey Bussing, DNP, AGPCNP-BC, AOCNP 09/07/21  Addendum  I have seen the patient, examined him. I agree with the assessment and and plan and have edited the notes.   Pt has started hemodialysis today due to his worsening renal  function.  He has developed headache and neck pain after dialysis.  I reviewed his bone marrow biopsy results with patient and his male friend who will likely help him at home after discharge.  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  Potential benefit and side effects discussed with patient, he agrees to proceed.  Chemo consent obtained today.  Due to the logistics of chemo, I will start him on weekly dexa 51m tomorrow, and velcade injection and oral Cytoxan on Monday, and Dara on week 2 in office. I will reach out to iv team and pharmacy and arrange his chemo Monday. Will get a bone survey in next few days.   YTruitt Merle 09/07/2021

## 2021-09-07 NOTE — Progress Notes (Signed)
PROGRESS NOTE        PATIENT DETAILS Name: Caleb Simmons Age: 70 y.o. Sex: male Date of Birth: June 20, 1951 Admit Date: 08/23/2021   Brief Narrative: Patient is a 70 y.o. male with history of nephrolithiasis-presenting with generalized weakness/shortness of breath-found to have AKI and severe normocytic anemia.  Patient was evaluated by nephrology, subsequently underwent right renal biopsy on 9/6 had postrenal biopsy bleeding into the renal collecting system-with clot retention requiring insertion of three-way catheter and CBI. Patient continued to have renal bleeding-on 9/8-IR performed embolization. On 9/10, received 2 units PRBC for hemoglobin of 6.6 and hematuria. On 9/12, renal biopsy showed amyloidosis. Oncology consulted for further management.   Antimicrobial therapy: None  Procedures : 9/06>> ultrasound-guided biopsy of right kidney 9/8>> 1) Right IJ temporary hemodialysis catheter placement 2) Right renal angiogram 3) Selective catheterization and embolization of right inferior pole arcuate artery  Consults: Nephrology, IR, urology, oncology  DVT Prophylaxis : Place and maintain sequential compression device Start: 09/01/21 1335 Place and maintain sequential compression device Start: 08/24/21 0519   Subjective: Pt reporting poor appetite, some neck discomfort  and headache after HD   Assessment/Plan:  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 Now requiring HD Currently nonoliguric, creatinine level stable around 5 Renal biopsy showed AL amyloidosis Nephrology on board, started HD treatment on 09/07/21 via R IJ temp cath Oncology input greatly appreciated, bone marrow biopsy done on 09/05/2021 showed plasma cell myeloma, plan to initiate treatment while inpatient Daily renal panel  Sepsis 2/2 Grp B strep bacteremia Grp B strep UTI Noted to spike temp 100.8 with chills, tachycardic, leukocytosis BC X 2 Grp B strep, repeat  pending Procalcitonin 0.76, will trend LA WNL UA with significant hematuria, many bacteria, >50 WBC, UC with grp B strep CXR unremarkable for infection S/p Cefepime, Vancomycin---> IV Penicillin  Monitor closely  Hematuria-acute urinary retention (clot retention) due to bleeding into the collecting system of the right kidney post renal biopsy on 9/6-requiring three-way Foley catheter insertion/CBI and embolization by IR on 9/8 Normocytic anemia/acute blood loss anemia Management per urology, Foley catheter discontinued 9/8, he does remain with some mild hematuria He did require multiple transfusions Frequent CBC  Anxiety Continue Xanax while he is in the hospital-is aware that we will not continue Xanax post discharge.  Use trazodone for sleep.  Patient is not interested in starting long-acting medications like SSRI for his anxiety-thinks that once he gets home-he will not require any further medications for his anxiety issues.  Tobacco abuse Continue transdermal nicotine.     Diet: Diet Order             Diet Heart Room service appropriate? Yes; Fluid consistency: Thin  Diet effective now           Diet - low sodium heart healthy                    Code Status: Full code   Family Communication: None at bedside  DVT prophylaxis: SCDs  Disposition Plan: Status is: Inpatient  Remains inpatient appropriate because:Inpatient level of care appropriate due to severity of illness  Dispo: The patient is from: Home              Anticipated d/c is to: Home              Patient currently  is not medically stable to d/c.   Difficult to place patient No    Antimicrobial agents: Anti-infectives (From admission, onward)    Start     Dose/Rate Route Frequency Ordered Stop   09/07/21 1400  penicillin G potassium 4 Million Units in dextrose 5 % 250 mL IVPB        4 Million Units 250 mL/hr over 60 Minutes Intravenous Every 8 hours 09/07/21 0827     09/06/21 1400   ceFEPIme (MAXIPIME) 1 g in sodium chloride 0.9 % 100 mL IVPB  Status:  Discontinued        1 g 200 mL/hr over 30 Minutes Intravenous Every 24 hours 09/06/21 1307 09/07/21 0827   09/06/21 1400  vancomycin (VANCOREADY) IVPB 1750 mg/350 mL        1,750 mg 175 mL/hr over 120 Minutes Intravenous  Once 09/06/21 1310 09/06/21 2256        MEDICATIONS: Scheduled Meds:  Chlorhexidine Gluconate Cloth  6 each Topical Daily   feeding supplement (NEPRO CARB STEADY)  237 mL Oral TID BM   folic acid  2 mg Oral Daily   melatonin  5 mg Oral QHS   nicotine  14 mg Transdermal Q24H   pantoprazole  40 mg Oral BID AC   sodium bicarbonate  1,300 mg Oral TID   tamsulosin  0.4 mg Oral Daily   Continuous Infusions:  sodium chloride     sodium chloride 10 mL/hr at 08/27/21 2147   pencillin G potassium IV     sodium chloride irrigation     PRN Meds:.acetaminophen, ALPRAZolam, bisacodyl, diphenhydrAMINE, lidocaine, ondansetron **OR** ondansetron (ZOFRAN) IV, oxyCODONE, promethazine, technetium albumin aggregated, traMADol, traZODone   PHYSICAL EXAM: Vital signs: Vitals:   09/07/21 1600 09/07/21 1615 09/07/21 1639 09/07/21 1701  BP: 100/65  122/69   Pulse:   71 82  Resp:   17 19  Temp:  98.5 F (36.9 C) 98.5 F (36.9 C)   TempSrc:  Oral Oral   SpO2:    94%  Weight:  71 kg    Height:       Filed Weights   08/23/21 0816 09/07/21 1431 09/07/21 1615  Weight: 81.6 kg 71.7 kg 71 kg   Body mass index is 22.46 kg/m.   General: NAD  Cardiovascular: S1, S2 present Respiratory: CTAB Abdomen: Soft, nontender, nondistended, bowel sounds present Musculoskeletal: No bilateral pedal edema noted Skin: Normal Psychiatry: Normal mood        I have personally reviewed following labs and imaging studies  LABORATORY DATA: CBC: Recent Labs  Lab 09/03/21 0214 09/04/21 0818 09/05/21 0302 09/06/21 0317 09/07/21 0125  WBC 8.8 8.0 8.5 9.0 10.9*  HGB 8.7* 9.5* 9.6* 9.6* 9.6*  HCT 27.1* 30.0* 30.3*  30.7* 30.6*  MCV 85.0 84.5 84.6 85.0 85.2  PLT 278 321 380 380 151    Basic Metabolic Panel: Recent Labs  Lab 09/03/21 0214 09/04/21 0256 09/05/21 0302 09/06/21 0317 09/07/21 0125  NA 138 138 136 138 134*  K 3.5 3.4* 3.6 4.0 3.9  CL 105 101 101 102 98  CO2 _0 GLUCOSE 85 106* 87 95 89  BUN 36* 37* 34* 33* 36*  CREATININE 5.74* 5.77* 5.85* 5.97* 6.39*  CALCIUM 8.1* 8.2* 8.1* 8.1* 8.0*  PHOS 5.2* 4.8* 3.8 4.5 5.6*    GFR: Estimated Creatinine Clearance: 10.6 mL/min (A) (by C-G formula based on SCr of 6.39 mg/dL (H)).  Liver Function Tests: Recent Labs  Lab 09/03/21 0214  09/04/21 0256 09/05/21 0302 09/06/21 0317 09/07/21 0125  ALBUMIN 1.9* 1.8* 1.8* 1.8* 1.7*   No results for input(s): LIPASE, AMYLASE in the last 168 hours.  No results for input(s): AMMONIA in the last 168 hours.  Coagulation Profile: No results for input(s): INR, PROTIME in the last 168 hours.   Cardiac Enzymes: No results for input(s): CKTOTAL, CKMB, CKMBINDEX, TROPONINI in the last 168 hours.  BNP (last 3 results) No results for input(s): PROBNP in the last 8760 hours.  Lipid Profile: No results for input(s): CHOL, HDL, LDLCALC, TRIG, CHOLHDL, LDLDIRECT in the last 72 hours.  Thyroid Function Tests: No results for input(s): TSH, T4TOTAL, FREET4, T3FREE, THYROIDAB in the last 72 hours.  Anemia Panel: No results for input(s): VITAMINB12, FOLATE, FERRITIN, TIBC, IRON, RETICCTPCT in the last 72 hours.   Urine analysis:    Component Value Date/Time   COLORURINE RED (A) 09/06/2021 1229   APPEARANCEUR TURBID (A) 09/06/2021 1229   LABSPEC  09/06/2021 1229    TEST NOT REPORTED DUE TO COLOR INTERFERENCE OF URINE PIGMENT   PHURINE  09/06/2021 1229    TEST NOT REPORTED DUE TO COLOR INTERFERENCE OF URINE PIGMENT   GLUCOSEU (A) 09/06/2021 1229    TEST NOT REPORTED DUE TO COLOR INTERFERENCE OF URINE PIGMENT   HGBUR (A) 09/06/2021 1229    TEST NOT REPORTED DUE TO COLOR INTERFERENCE  OF URINE PIGMENT   BILIRUBINUR (A) 09/06/2021 1229    TEST NOT REPORTED DUE TO COLOR INTERFERENCE OF URINE PIGMENT   KETONESUR (A) 09/06/2021 1229    TEST NOT REPORTED DUE TO COLOR INTERFERENCE OF URINE PIGMENT   PROTEINUR (A) 09/06/2021 1229    TEST NOT REPORTED DUE TO COLOR INTERFERENCE OF URINE PIGMENT   UROBILINOGEN 0.2 06/26/2007 1023   NITRITE (A) 09/06/2021 1229    TEST NOT REPORTED DUE TO COLOR INTERFERENCE OF URINE PIGMENT   LEUKOCYTESUR (A) 09/06/2021 1229    TEST NOT REPORTED DUE TO COLOR INTERFERENCE OF URINE PIGMENT    Sepsis Labs: Lactic Acid, Venous    Component Value Date/Time   LATICACIDVEN 0.8 09/06/2021 1512    MICROBIOLOGY: Recent Results (from the past 240 hour(s))  Urine Culture     Status: Abnormal   Collection Time: 09/06/21 12:29 PM   Specimen: Urine, Clean Catch  Result Value Ref Range Status   Specimen Description URINE, CLEAN CATCH  Final   Special Requests NONE  Final   Culture (A)  Final    >=100,000 COLONIES/mL GROUP B STREP(S.AGALACTIAE)ISOLATED TESTING AGAINST S. AGALACTIAE NOT ROUTINELY PERFORMED DUE TO PREDICTABILITY OF AMP/PEN/VAN SUSCEPTIBILITY. Performed at [facility], [place] [address] [place], [place] [address]    Report Status 09/07/2021 FINAL  Final  Culture, blood (routine x 2)     Status: None (Preliminary result)   Collection Time: 09/06/21 12:34 PM   Specimen: BLOOD RIGHT HAND  Result Value Ref Range Status   Specimen Description BLOOD RIGHT HAND  Final   Special Requests   Final    BOTTLES DRAWN AEROBIC AND ANAEROBIC Blood Culture adequate volume   Culture  Setup Time   Final    GRAM POSITIVE COCCI IN CHAINS IN BOTH AEROBIC AND ANAEROBIC BOTTLES CRITICAL RESULT CALLED TO, READ BACK BY AND VERIFIED WITH: V BRYK,[email or web address]  09/07/21 [place] Performed at [facility], [place] [address], [place], [place] [address]    Culture [facility] POSITIVE COCCI  Final   Report Status PENDING  Incomplete  Blood Culture ID Panel (Reflexed)  Status: Abnormal   Collection Time: 09/06/21 12:34 PM  Result Value Ref Range Status   Enterococcus faecalis NOT DETECTED NOT DETECTED Final   Enterococcus Faecium NOT DETECTED NOT DETECTED Final   Listeria monocytogenes NOT DETECTED NOT DETECTED Final   Staphylococcus species NOT DETECTED NOT DETECTED Final   Staphylococcus aureus (BCID) NOT DETECTED NOT DETECTED Final   Staphylococcus epidermidis NOT DETECTED NOT DETECTED Final   Staphylococcus lugdunensis NOT DETECTED NOT DETECTED Final   Streptococcus species DETECTED (A) NOT DETECTED Final    Comment: CRITICAL RESULT CALLED TO, READ BACK BY AND VERIFIED WITH: V BRYK,[email or web address]_[email or web address]  09/07/21 [place]    Streptococcus agalactiae DETECTED (A) NOT DETECTED Final    Comment: CRITICAL RESULT CALLED TO, READ BACK BY AND VERIFIED WITH: V BRYK,[email or web address]_1  09/07/21 [place]    Streptococcus pneumoniae NOT DETECTED NOT DETECTED Final   Streptococcus pyogenes NOT DETECTED NOT DETECTED Final   A.calcoaceticus-baumannii NOT DETECTED NOT DETECTED Final   Bacteroides fragilis NOT DETECTED NOT DETECTED Final   Enterobacterales NOT DETECTED NOT DETECTED Final   Enterobacter cloacae complex NOT DETECTED NOT DETECTED Final   Escherichia coli NOT DETECTED NOT DETECTED Final   Klebsiella aerogenes NOT DETECTED NOT DETECTED Final   Klebsiella oxytoca NOT DETECTED NOT DETECTED Final   Klebsiella pneumoniae NOT DETECTED NOT DETECTED Final   Proteus species NOT DETECTED NOT DETECTED Final   Salmonella species NOT DETECTED NOT DETECTED Final   Serratia marcescens NOT DETECTED NOT DETECTED Final   Haemophilus influenzae NOT DETECTED NOT DETECTED Final   Neisseria meningitidis NOT DETECTED NOT DETECTED Final   Pseudomonas aeruginosa NOT DETECTED NOT DETECTED Final   Stenotrophomonas maltophilia NOT DETECTED NOT DETECTED Final   Candida albicans NOT DETECTED NOT DETECTED Final   Candida auris NOT DETECTED NOT DETECTED Final   Candida glabrata NOT DETECTED NOT  DETECTED Final   Candida krusei NOT DETECTED NOT DETECTED Final   Candida parapsilosis NOT DETECTED NOT DETECTED Final   Candida tropicalis NOT DETECTED NOT DETECTED Final   Cryptococcus neoformans/gattii NOT DETECTED NOT DETECTED Final    Comment: Performed at [facility], [address], [place], [place] [address]  Culture, blood (routine x 2)     Status: None (Preliminary result)   Collection Time: 09/06/21 12:46 PM   Specimen: BLOOD RIGHT HAND  Result Value Ref Range Status   Specimen Description BLOOD RIGHT HAND  Final   Special Requests   Final    BOTTLES DRAWN AEROBIC AND ANAEROBIC Blood Culture adequate volume   Culture  Setup Time   Final    GRAM POSITIVE COCCI IN CHAINS IN BOTH AEROBIC AND ANAEROBIC BOTTLES CRITICAL VALUE NOTED.  VALUE IS CONSISTENT WITH PREVIOUSLY REPORTED AND CALLED VALUE.    Culture   Final    NO GROWTH < 24 HOURS Performed at [facility], [place] [address], [place],  [address]    Report Status PENDING  Incomplete     RADIOLOGY STUDIES/RESULTS: DG Chest 2 View  Result Date: 09/06/2021 CLINICAL DATA:  Fever EXAM: CHEST - 2 VIEW COMPARISON:  09/03/2021 FINDINGS: Right IJ dialysis catheter tip med right atrium. Midline trachea. Normal heart size. Left costophrenic angle minimally excluded the frontal radiograph. Small bilateral pleural effusions. No pneumothorax. Improved interstitial edema with minimal pulmonary venous congestion remaining. Persistent mild bibasilar atelectasis. IMPRESSION: Improved interstitial edema with mild pulmonary venous congestion remaining. Small bilateral pleural effusions with adjacent atelectasis. Electronically Signed   By: Abigail Miyamoto M.D.   On: 09/06/2021 13:49  LOS: 15 days   Alma Friendly, MD  [facility]    09/07/2021, 6:03 PM

## 2021-09-07 NOTE — Progress Notes (Signed)
PHARMACY - PHYSICIAN COMMUNICATION CRITICAL VALUE ALERT - BLOOD CULTURE IDENTIFICATION (BCID)  Caleb Simmons is an 70 y.o. male who presented to [facility] on 08/23/2021 with a chief complaint of weakness and SOB.  Assessment:  Started on broad-spectrum ABX on 9/15 for concern for sepsis; blood cx growing Streptococcus agalactiae in all four bottles.  Name of physician (or Provider) Contacted: Alma Friendly, MD  Current antibiotics: vancomycin and cefepime  Changes to prescribed antibiotics recommended:  Recommendations accepted by provider; narrow to PCN 4 million units Q8H.  Results for orders placed or performed during the hospital encounter of 08/23/21  Blood Culture ID Panel (Reflexed) (Collected: 09/06/2021 12:34 PM)  Result Value Ref Range   Enterococcus faecalis NOT DETECTED NOT DETECTED   Enterococcus Faecium NOT DETECTED NOT DETECTED   Listeria monocytogenes NOT DETECTED NOT DETECTED   Staphylococcus species NOT DETECTED NOT DETECTED   Staphylococcus aureus (BCID) NOT DETECTED NOT DETECTED   Staphylococcus epidermidis NOT DETECTED NOT DETECTED   Staphylococcus lugdunensis NOT DETECTED NOT DETECTED   Streptococcus species DETECTED (A) NOT DETECTED   Streptococcus agalactiae DETECTED (A) NOT DETECTED   Streptococcus pneumoniae NOT DETECTED NOT DETECTED   Streptococcus pyogenes NOT DETECTED NOT DETECTED   A.calcoaceticus-baumannii NOT DETECTED NOT DETECTED   Bacteroides fragilis NOT DETECTED NOT DETECTED   Enterobacterales NOT DETECTED NOT DETECTED   Enterobacter cloacae complex NOT DETECTED NOT DETECTED   Escherichia coli NOT DETECTED NOT DETECTED   Klebsiella aerogenes NOT DETECTED NOT DETECTED   Klebsiella oxytoca NOT DETECTED NOT DETECTED   Klebsiella pneumoniae NOT DETECTED NOT DETECTED   Proteus species NOT DETECTED NOT DETECTED   Salmonella species NOT DETECTED NOT DETECTED   Serratia marcescens NOT DETECTED NOT DETECTED   Haemophilus influenzae NOT DETECTED  NOT DETECTED   Neisseria meningitidis NOT DETECTED NOT DETECTED   Pseudomonas aeruginosa NOT DETECTED NOT DETECTED   Stenotrophomonas maltophilia NOT DETECTED NOT DETECTED   Candida albicans NOT DETECTED NOT DETECTED   Candida auris NOT DETECTED NOT DETECTED   Candida glabrata NOT DETECTED NOT DETECTED   Candida krusei NOT DETECTED NOT DETECTED   Candida parapsilosis NOT DETECTED NOT DETECTED   Candida tropicalis NOT DETECTED NOT DETECTED   Cryptococcus neoformans/gattii NOT DETECTED NOT DETECTED    Wynona Neat, PharmD, BCPS  09/07/2021  8:23 AM

## 2021-09-07 NOTE — Progress Notes (Signed)
PT Cancellation Note  Patient Details Name: Caleb Simmons MRN: [id number] DOB: February 07, 1951   Cancelled Treatment:    Reason Eval/Treat Not Completed: Patient declined, no reason specified. Attempted PT session earlier today with pt reporting wanting to wait for PT until after he completes his HD treatment this date. Pt at HD treatment currently. Will plan to follow-up later as time permits.   [name], PT, DPT Acute Rehabilitation Services  Pager: [phone] Office: [place] 09/07/2021, 3:33 PM

## 2021-09-07 NOTE — Progress Notes (Signed)
[facility] NEPHROLOGY PROGRESS NOTE  Assessment/ Plan:  #Acute kidney injury, nonoliguric, with history of excessive NSAID's use/BC powders every day.  Peaked creatinine level of 6.95.  CT scan without obstruction or hydronephrosis.  UA with more than 300 protein and microscopic hematuria.  Serology evaluation including ANA, ANCA, C3, C4, SPEP, anti-GBM, double-stranded DNA, hep B, hep C, HIV negative.   -He underwent IR guided kidney biopsy on 9/6 complicated by postbiopsy bleeding. Biopsy: AL amyloidosis, lambda light chain composition (glomeruli, interstitium, arteries, and arterioles). Marked IF, 59% global glomerular sclerosis and marked arteriosclerosis. Final read/EM pending. -He had right IJ temporary HD catheter placed on 9/8 in IR during embolization of the bleeding artery.   -given persistent lack of appetite, now with dysgeusia and hiccups, I do suspect that he is uremic. Antony Haste and I have discussed renal replacement therapy over the last few days and he is accepting of this. Will start HD#1 (slow start protocol) hopefully today via his rij temp line. Will tentatively plan for HD tomorrow again -Check daily lab, strict ins and out  #Post kidney biopsy bleeding, syncope/acute blood loss anemia: He initially received continuous bladder irrigation.  Because of ongoing bleeding, he underwent embolization of right inferior pole arcuate artery and right IJ temporary HD catheter placement by IR on 9/8.  Still having some hematuria, not on CBI at the moment, urology is following; defer to them.  Monitor CBC.  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  Received multiple units of blood transfusion.  Hemoglobin is stable  today.On admission FOBT was negative and treated with IV iron.  #History of nephrolithiasis: Repeat CT scan without any obstruction or hydronephrosis.  Urology is following  #Metabolic acidosis: Continue oral sodium bicarbonate, stable CO2 level.  Subjective: Seen and examined bedside. No acute events. Uop 2.1L. Reports no appetite and persistent dysgeusia after trying to eat yesterday. He also started noticing hiccups. No other complaints, he reports that his breathing is okay right now.  Objective Vital signs in last 24 hours: Vitals:   09/06/21 2307 09/06/21 2345 09/07/21 0445 09/07/21 0747  BP: 114/72 119/73 (!) 107/58 126/87  Pulse: 81 79 88   Resp: _0 Temp: 98.4 F (36.9 C) 98.8 F (37.1 C) 98.6 F (37 C)   TempSrc: Oral Oral Oral   SpO2: 95% 96% 95% 93%  Weight:      Height:       Weight change:   Intake/Output Summary (Last 24 hours) at 09/07/2021 1027 Last data filed at 09/07/2021 0520 Gross per 24 hour  Intake 2220.97 ml  Output 1775 ml  Net 445.97 ml       Labs: Basic Metabolic Panel: Recent Labs  Lab 09/05/21 0302 09/06/21 0317 09/07/21 0125  NA 136 138 134*  K 3.6 4.0 3.9  CL 101 102 98  CO2 _1 GLUCOSE 87 95 89  BUN 34* 33* 36*  CREATININE 5.85* 5.97* 6.39*  CALCIUM 8.1* 8.1* 8.0*  PHOS 3.8 4.5 5.6*   Liver Function Tests: Recent Labs  Lab 09/05/21 0302 09/06/21 0317 09/07/21 0125  ALBUMIN 1.8* 1.8* 1.7*   No results for input(s): LIPASE, AMYLASE in the last 168 hours.  No results for input(s):  AMMONIA in the last 168 hours. CBC: Recent Labs  Lab 09/03/21 0214 09/04/21 0818 09/05/21 0302 09/06/21 0317 09/07/21 0125  WBC 8.8 8.0 8.5 9.0 10.9*  HGB 8.7* 9.5* 9.6* 9.6* 9.6*  HCT 27.1* 30.0* 30.3* 30.7* 30.6*  MCV 85.0 84.5 84.6 85.0 85.2  PLT 278 321 380 380 370   Cardiac Enzymes: No results for input(s): CKTOTAL, CKMB, CKMBINDEX, TROPONINI in the last 168 hours. CBG: No results for input(s): GLUCAP in the last  168 hours.  Iron Studies: No results for input(s): IRON, TIBC, TRANSFERRIN, FERRITIN in the last 72 hours. Studies/Results: DG Chest 2 View  Result Date: 09/06/2021 CLINICAL DATA:  Fever EXAM: CHEST - 2 VIEW COMPARISON:  09/03/2021 FINDINGS: Right IJ dialysis catheter tip med right atrium. Midline trachea. Normal heart size. Left costophrenic angle minimally excluded the frontal radiograph. Small bilateral pleural effusions. No pneumothorax. Improved interstitial edema with minimal pulmonary venous congestion remaining. Persistent mild bibasilar atelectasis. IMPRESSION: Improved interstitial edema with mild pulmonary venous congestion remaining. Small bilateral pleural effusions with adjacent atelectasis. Electronically Signed   By: Abigail Miyamoto M.D.   On: 09/06/2021 13:49    Medications: Infusions:  sodium chloride     sodium chloride 10 mL/hr at 08/27/21 2147   pencillin G potassium IV     sodium chloride irrigation      Scheduled Medications:  Chlorhexidine Gluconate Cloth  6 each Topical Daily   feeding supplement (NEPRO CARB STEADY)  237 mL Oral TID BM   folic acid  2 mg Oral Daily   melatonin  5 mg Oral QHS   nicotine  14 mg Transdermal Q24H   pantoprazole  40 mg Oral BID AC   sodium bicarbonate  1,300 mg Oral TID   tamsulosin  0.4 mg Oral Daily    have reviewed scheduled and prn medications.  Physical Exam: General: nad Heart: rrr Lungs: normal wob Abdomen: Soft, nontender Extremities: No LE edema. Neurology: Alert, awake, following commands, no asterixis Vascular Access: Right IJ temporary HD catheter in place.  Zaelynn Fuchs 09/07/2021,10:27 AM  LOS: 15 days

## 2021-09-07 NOTE — Progress Notes (Signed)
I secure chatted Dr. Horris Latino that patient is still having this headache that he has all day and he is now complaining that in HD his neck was stiff from positioning.

## 2021-09-08 ENCOUNTER — Inpatient Hospital Stay (HOSPITAL_COMMUNITY): Payer: Commercial Managed Care - PPO

## 2021-09-08 DIAGNOSIS — R7881 Bacteremia: Secondary | ICD-10-CM | POA: Diagnosis not present

## 2021-09-08 DIAGNOSIS — E854 Organ-limited amyloidosis: Secondary | ICD-10-CM | POA: Diagnosis not present

## 2021-09-08 DIAGNOSIS — N390 Urinary tract infection, site not specified: Secondary | ICD-10-CM | POA: Diagnosis not present

## 2021-09-08 DIAGNOSIS — N179 Acute kidney failure, unspecified: Secondary | ICD-10-CM | POA: Diagnosis not present

## 2021-09-08 LAB — ECHOCARDIOGRAM COMPLETE
Area-P 1/2: 3.65 cm2
Height: 70 in
S' Lateral: 3.9 cm
Weight: 2504.43 oz

## 2021-09-08 LAB — RENAL FUNCTION PANEL
Albumin: 1.6 g/dL — ABNORMAL LOW (ref 3.5–5.0)
Anion gap: 12 (ref 5–15)
BUN: 32 mg/dL — ABNORMAL HIGH (ref 8–23)
CO2: 23 mmol/L (ref 22–32)
Calcium: 7.8 mg/dL — ABNORMAL LOW (ref 8.9–10.3)
Chloride: 98 mmol/L (ref 98–111)
Creatinine, Ser: 5.52 mg/dL — ABNORMAL HIGH (ref 0.61–1.24)
GFR, Estimated: 10 mL/min — ABNORMAL LOW (ref >60)
Glucose, Bld: 105 mg/dL — ABNORMAL HIGH (ref 70–99)
Phosphorus: 3.9 mg/dL (ref 2.5–4.6)
Potassium: 3.3 mmol/L — ABNORMAL LOW (ref 3.5–5.1)
Sodium: 133 mmol/L — ABNORMAL LOW (ref 135–145)

## 2021-09-08 LAB — CBC
HCT: 29.5 % — ABNORMAL LOW (ref 39.0–52.0)
Hemoglobin: 9.3 g/dL — ABNORMAL LOW (ref 13.0–17.0)
MCH: 26.8 pg (ref 26.0–34.0)
MCHC: 31.5 g/dL (ref 30.0–36.0)
MCV: 85 fL (ref 80.0–100.0)
Platelets: 380 10*3/uL (ref 150–400)
RBC: 3.47 MIL/uL — ABNORMAL LOW (ref 4.22–5.81)
RDW: 17.6 % — ABNORMAL HIGH (ref 11.5–15.5)
WBC: 7.5 10*3/uL (ref 4.0–10.5)
nRBC: 0 % (ref 0.0–0.2)

## 2021-09-08 LAB — PROCALCITONIN: Procalcitonin: 2.19 ng/mL

## 2021-09-08 LAB — MAGNESIUM: Magnesium: 1.7 mg/dL (ref 1.7–2.4)

## 2021-09-08 IMAGING — CR DG BONE SURVEY MET
9 of 10 series · 9 of 10 positions shown · non-contrast
Comparison: None.

CLINICAL DATA: Left hip pain.

EXAM:
METASTATIC BONE SURVEY

[skull lat]
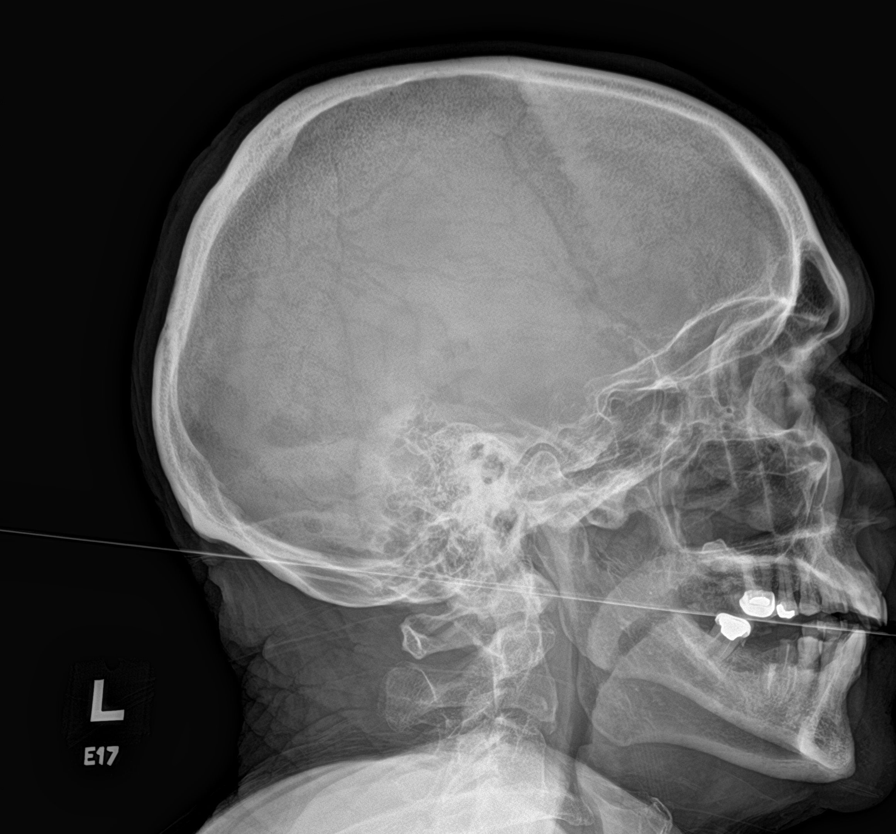

[shoulder ap (1 of 2)]
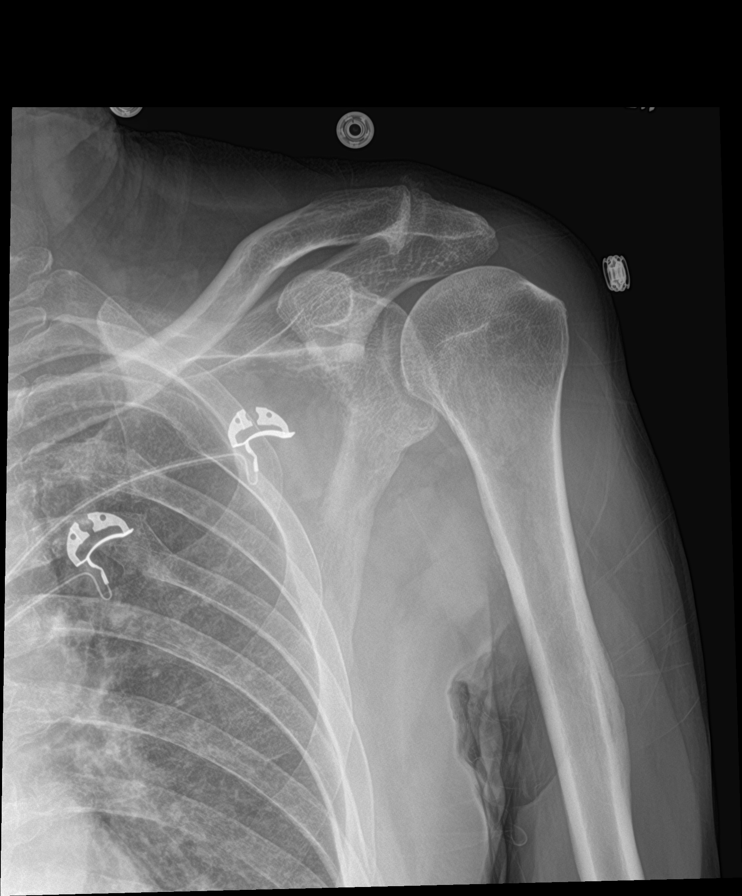

[shoulder ap (2 of 2)]
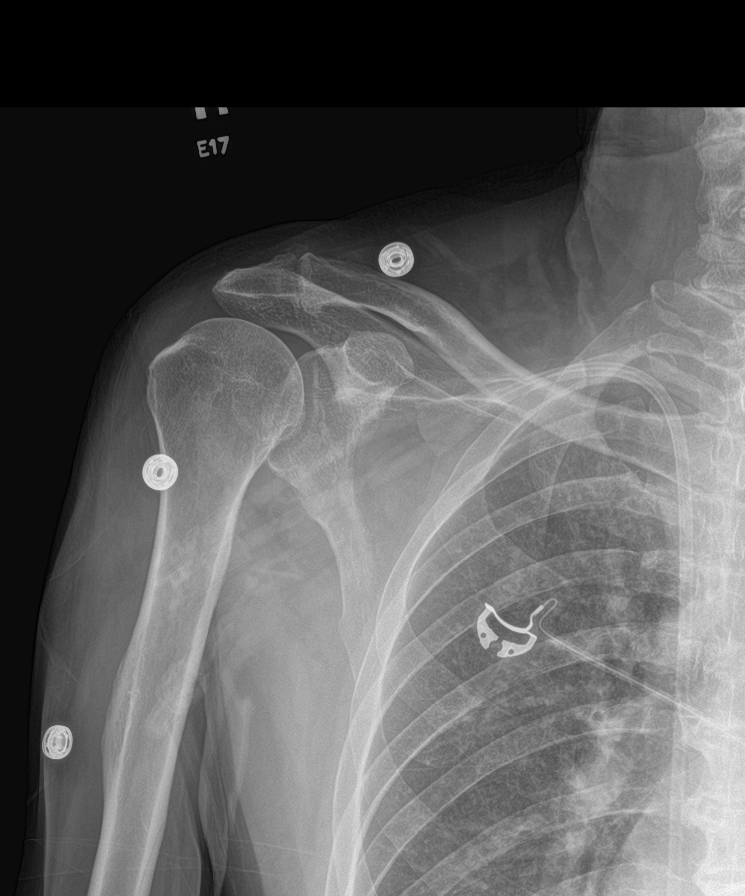

[humerus ap (1 of 2)]
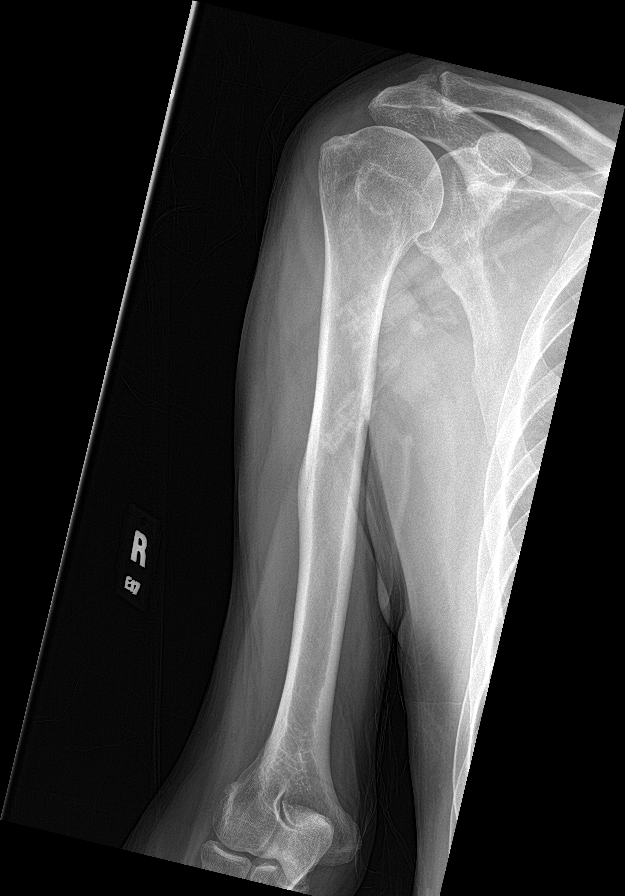

[humerus ap (2 of 2)]
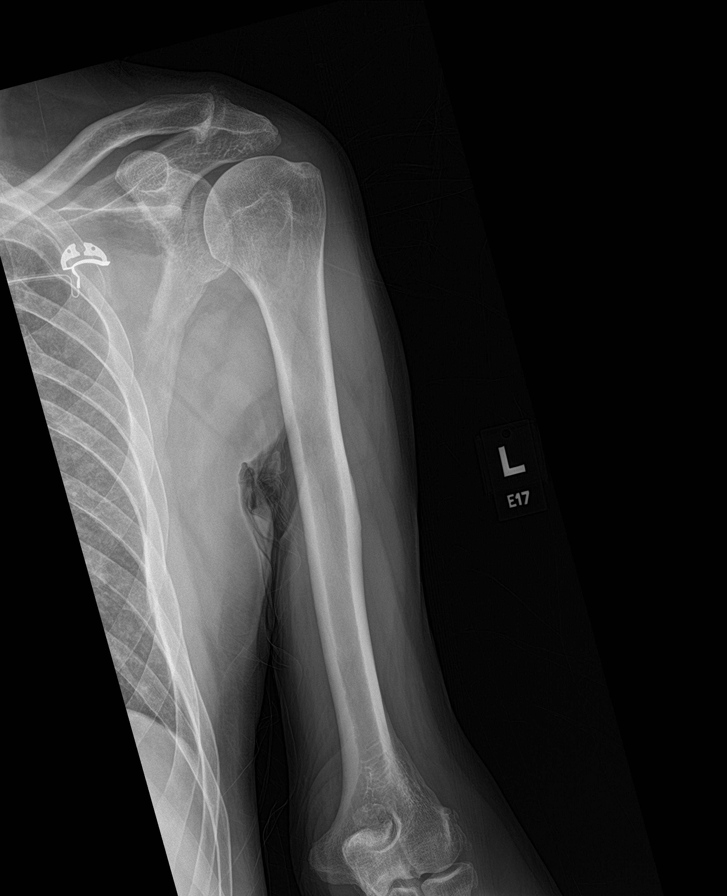

[forearm ap (1 of 2)]
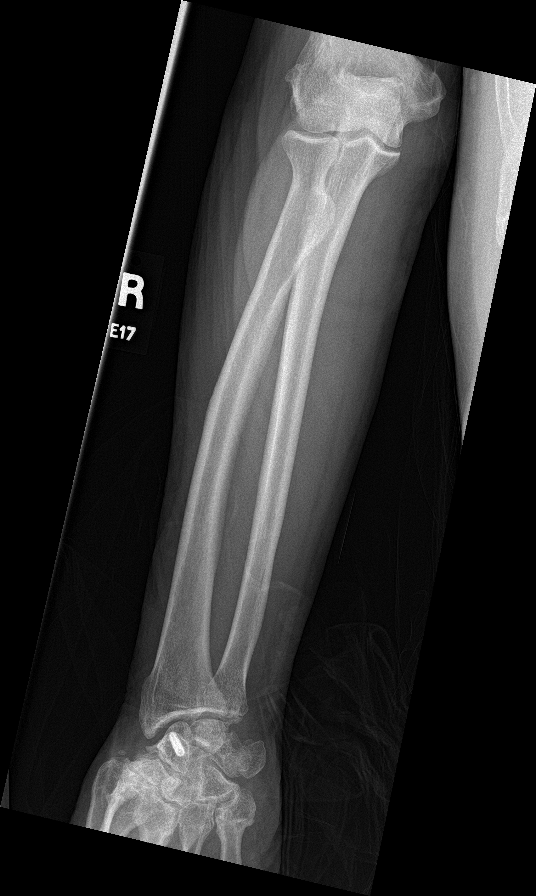

[forearm ap (2 of 2)]
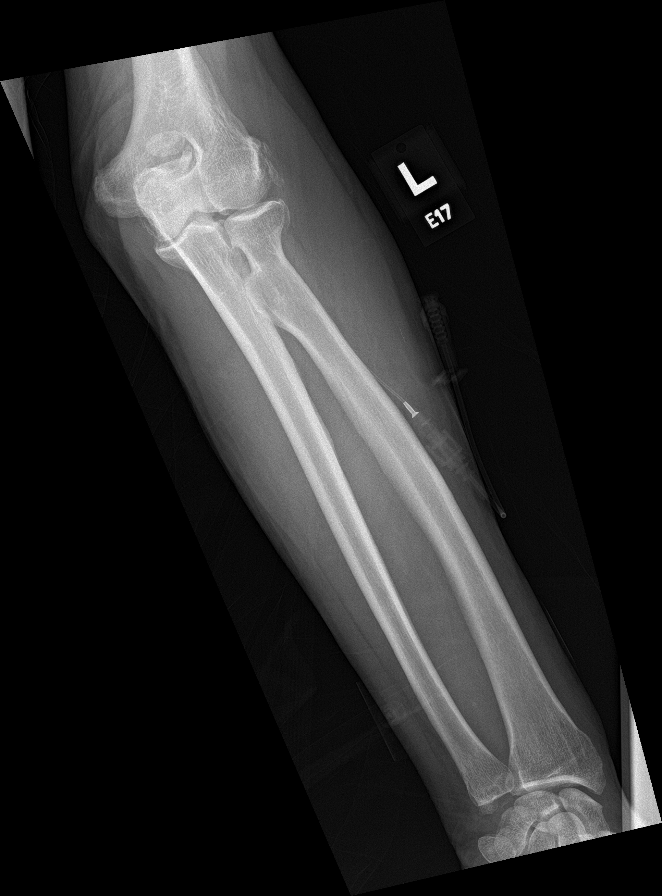

[c-spine ap]
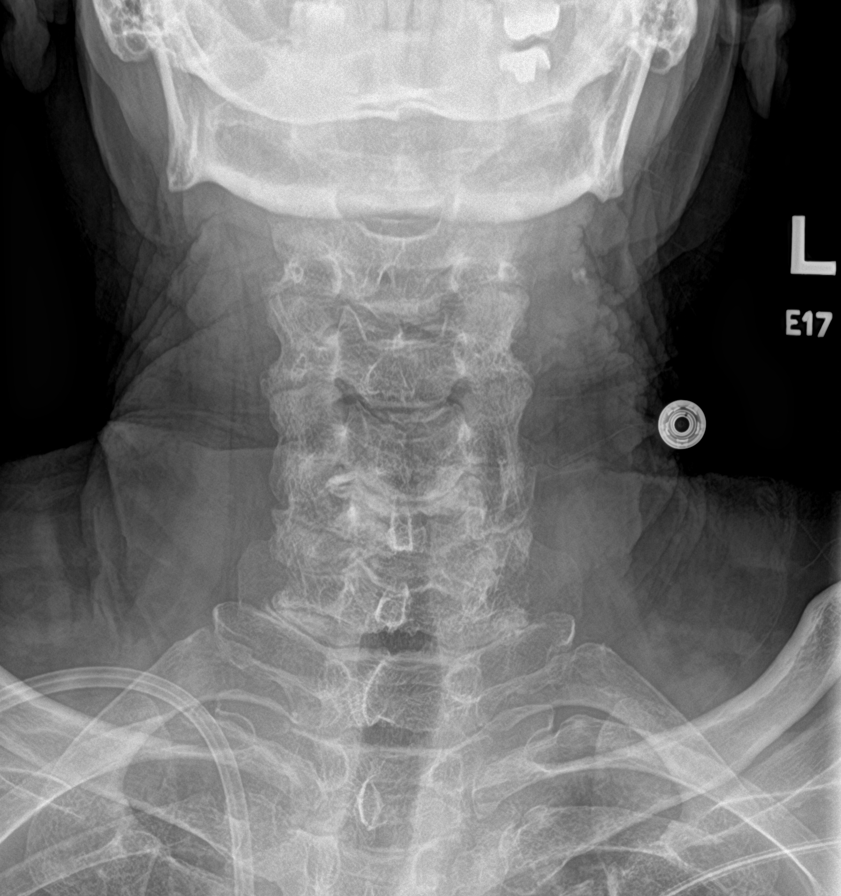

[c-spine lat]
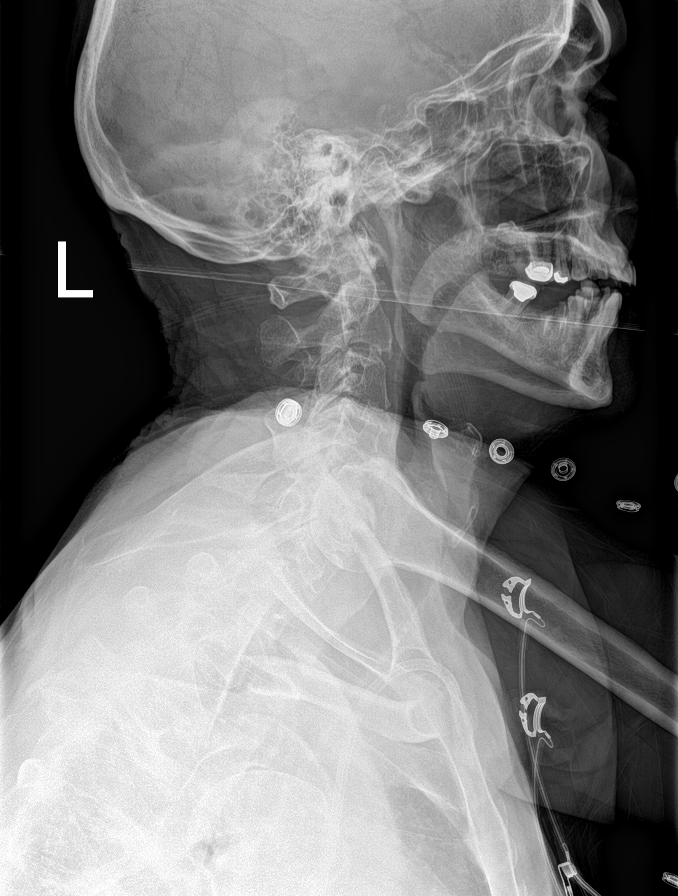

[9 of 10 positions shown; findings below may reference images not displayed]

FINDINGS: No metastatic lesions identified. No cause for left hip pain
identified.
IMPRESSION: No acute abnormalities identified. No metastatic lesions. No cause
for hip pain noted.

## 2021-09-08 MED ORDER — POTASSIUM CHLORIDE CRYS ER 20 MEQ PO TBCR
40.0000 meq | EXTENDED_RELEASE_TABLET | Freq: Once | ORAL | Status: AC
  Administered 2021-09-08: 40 meq via ORAL
  Filled 2021-09-08: qty 2

## 2021-09-08 MED ORDER — GERHARDT'S BUTT CREAM
TOPICAL_CREAM | Freq: Two times a day (BID) | CUTANEOUS | Status: DC
  Administered 2021-09-09 – 2021-09-17 (×2): 1 via TOPICAL
  Filled 2021-09-08: qty 1

## 2021-09-08 NOTE — Progress Notes (Signed)
*  PRELIMINARY RESULTS* Echocardiogram 2D Echocardiogram has been performed.  Caleb Simmons 09/08/2021, 5:06 PM

## 2021-09-08 NOTE — Progress Notes (Signed)
Physical Therapy Treatment Patient Details Name: Caleb Simmons MRN: [id number] DOB: [birth year] Today's Date: 09/08/2021   History of Present Illness Patient is a 70 y.o. male presenting with generalized weakness/shortness of breath-found to have AKI and severe normocytic anemia. Underwent right renal biopsy on 9/6-unfortunately postrenal biopsy-patient developed bleeding into the renal collecting system-with clot retention requiring insertion of three-way catheter and CBI.  Patient continued to have renal bleeding-on 9/8-IR performed embolization. CT R iliac bone marrow biopsy 9/14. Past medical history significant of nephrolithiasis, 1/2 PPD smoker    PT Comments    Pt is making great progress towards his PT goals, ambulating up to ~400 ft without UE support with only supervision. However, he has difficulty changing gait speeds, likely due to the reported aching pain in his R hip s/p bone marrow biopsy at this time. Educated pt on regular mobility, OOB to chair, and exercises to perform while in hospital to prevent deconditioning/muscle atrophy. Will continue to follow acutely. Current recommendations remain appropriate.  BP:  122/76 supine 109/78 sitting (sligth lightheadedness) 95/71 standing 112/100 after ~3 minutes standing/walking 128/71 sitting end of session     Recommendations for follow up therapy are one component of a multi-disciplinary discharge planning process, led by the attending physician.  Recommendations may be updated based on patient status, additional functional criteria and insurance authorization.  Follow Up Recommendations  No PT follow up;Supervision - Intermittent     Equipment Recommendations  None recommended by PT    Recommendations for Other Services       Precautions / Restrictions Precautions Precautions: Fall Precaution Comments: has passed out once already Restrictions Weight Bearing Restrictions: No     Mobility  Bed Mobility Overal bed  mobility: Modified Independent             General bed mobility comments: HOB elevated, pt using bed rails with supine > sit transition.    Transfers Overall transfer level: Needs assistance Equipment used: None Transfers: Sit to/from Stand Sit to Stand: Min guard         General transfer comment: Min guard for safety and slightly increased time to come to stand, needing several minutes sitting EOB to prepare mentally for standing mobility.  Ambulation/Gait Ambulation/Gait assistance: Supervision;Min guard Gait Distance (Feet): 400 Feet Assistive device: None;1 person hand held assist Gait Pattern/deviations: Step-through pattern;Decreased stride length;Antalgic Gait velocity: decreased Gait velocity interpretation: <1.8 ft/sec, indicate of risk for recurrent falls General Gait Details: Pt initially reaching out for sink counter for stability in room, but quickly progressed to no UE support for gait. No LOB, progressing from min guard to supervision. Pt with no LOB changing head positions, but displayed difficulty changing gait speeds due to R hip pain. Slight antalgic gait pattern from R hip pain.   Stairs             Wheelchair Mobility    Modified Rankin (Stroke Patients Only)       Balance Overall balance assessment: Needs assistance Sitting-balance support: Feet supported Sitting balance-Leahy Scale: Good     Standing balance support: Single extremity supported;No upper extremity supported;During functional activity Standing balance-Leahy Scale: Good Standing balance comment: Able to ambulate without UE support or LOB.                            Cognition Arousal/Alertness: Awake/alert Behavior During Therapy: WFL for tasks assessed/performed Overall Cognitive Status: Within Functional Limits for tasks assessed  Exercises      General Comments General comments (skin integrity,  edema, etc.): BP: 122/76 supine, 109/78 sitting (sligth lightheadedness), 95/71 standing, 112/100 after ~3 minutes standing/walking, 128/71 sitting end of session; encouraged OOB and continued mobility and exercises to prevent muscle atrophy/deconditioning      Pertinent Vitals/Pain Pain Assessment: Faces Faces Pain Scale: Hurts little more Pain Location: R hip Pain Descriptors / Indicators: Aching;Operative site guarding Pain Intervention(s): Limited activity within patient's tolerance;Monitored during session;Repositioned    Home Living                      Prior Function            PT Goals (current goals can now be found in the care plan section) Acute Rehab PT Goals Patient Stated Goal: to feel better PT Goal Formulation: With patient/family Time For Goal Achievement: 09/15/21 Potential to Achieve Goals: Good Progress towards PT goals: Progressing toward goals    Frequency    Min 3X/week      PT Plan Current plan remains appropriate    Co-evaluation              AM-PAC PT "6 Clicks" Mobility   Outcome Measure  Help needed turning from your back to your side while in a flat bed without using bedrails?: None Help needed moving from lying on your back to sitting on the side of a flat bed without using bedrails?: None Help needed moving to and from a bed to a chair (including a wheelchair)?: A Little Help needed standing up from a chair using your arms (e.g., wheelchair or bedside chair)?: A Little Help needed to walk in hospital room?: A Little Help needed climbing 3-5 steps with a railing? : A Little 6 Click Score: 20    End of Session   Activity Tolerance: Patient tolerated treatment well Patient left: with call bell/phone within reach;in chair;with family/visitor present Nurse Communication: Mobility status;Other (comment) (vitals) PT Visit Diagnosis: Other abnormalities of gait and mobility (R26.89);Unsteadiness on feet (R26.81);Difficulty in  walking, not elsewhere classified (R26.2)     Time: 1020-1046 PT Time Calculation (min) (ACUTE ONLY): 26 min  Charges:  $Gait Training: 8-22 mins $Therapeutic Activity: 8-22 mins                     [name], PT, DPT Acute Rehabilitation Services  Pager: [phone] Office: [place] 09/08/2021, 10:58 AM

## 2021-09-08 NOTE — Progress Notes (Signed)
PROGRESS NOTE        PATIENT DETAILS Name: Caleb Simmons Age: 70 y.o. Sex: male Date of Birth: 27-Mar-1951 Admit Date: 08/23/2021   Brief Narrative: Patient is a 70 y.o. male with history of nephrolithiasis-presenting with generalized weakness/shortness of breath-found to have AKI and severe normocytic anemia.  Patient was evaluated by nephrology, subsequently underwent right renal biopsy on 9/6 had postrenal biopsy bleeding into the renal collecting system-with clot retention requiring insertion of three-way catheter and CBI. Patient continued to have renal bleeding-on 9/8-IR performed embolization. On 9/10, received 2 units PRBC for hemoglobin of 6.6 and hematuria. On 9/12, renal biopsy showed amyloidosis. Oncology consulted for further management.   Antimicrobial therapy: None  Procedures : 9/06>> ultrasound-guided biopsy of right kidney 9/8>> 1) Right IJ temporary hemodialysis catheter placement 2) Right renal angiogram 3) Selective catheterization and embolization of right inferior pole arcuate artery  Consults: Nephrology, IR, urology, oncology  DVT Prophylaxis : Place and maintain sequential compression device Start: 09/01/21 1335 Place and maintain sequential compression device Start: 08/24/21 0519   Subjective: Pt denies any new complaints, intermittent headache. 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, plan to initiate treatment while inpatient Daily renal panel  Sepsis 2/2 Grp B strep bacteremia Grp B strep UTI Noted to spike temp 100.8 with chills, tachycardic, leukocytosis BC X 2 Grp B strep, repeat  pending Procalcitonin 0.76, will trend LA WNL UA with significant hematuria, many bacteria, >50 WBC, UC with grp B strep CXR unremarkable for infection TTE with no known vegetation S/p Cefepime, Vancomycin---> IV Penicillin  Monitor closely  Hematuria-acute urinary retention (clot retention) due to bleeding into the collecting system of the right kidney post renal biopsy on 9/6-requiring three-way Foley catheter insertion/CBI and embolization by IR on 9/8 Normocytic anemia/acute blood loss anemia Management per urology, Foley catheter discontinued 9/8, he does remain with some mild hematuria He did require multiple transfusions Frequent CBC  Anxiety Continue Xanax while he is in the hospital-is aware that we will not continue Xanax post discharge.  Use trazodone for sleep.  Patient is not interested in starting long-acting medications like SSRI for his anxiety-thinks that once he gets home-he will not require any further medications for his anxiety issues.  Tobacco abuse Continue transdermal nicotine.     Diet: Diet Order             Diet regular Room service appropriate? Yes; Fluid consistency: Thin  Diet effective now           Diet - low sodium heart healthy                    Code Status: Full code   Family Communication: None at bedside  DVT prophylaxis: SCDs  Disposition Plan: Status is: Inpatient  Remains inpatient appropriate because:Inpatient level of care appropriate due to severity of illness  Dispo: The patient is from: Home              Anticipated d/c is to: Home  Patient currently is not medically stable to d/c.   Difficult to place patient No    Antimicrobial agents: Anti-infectives (From admission, onward)    Start     Dose/Rate Route Frequency Ordered Stop   09/07/21 1805  penicillin G potassium 4 Million Units in dextrose 5 % 250 mL IVPB        4 Million Units 250 mL/hr over 60 Minutes Intravenous Every 8 hours 09/07/21  1805     09/07/21 1400  penicillin G potassium 4 Million Units in dextrose 5 % 250 mL IVPB  Status:  Discontinued        4 Million Units 250 mL/hr over 60 Minutes Intravenous Every 8 hours 09/07/21 0827 09/07/21 1805   09/06/21 1400  ceFEPIme (MAXIPIME) 1 g in sodium chloride 0.9 % 100 mL IVPB  Status:  Discontinued        1 g 200 mL/hr over 30 Minutes Intravenous Every 24 hours 09/06/21 1307 09/07/21 0827   09/06/21 1400  vancomycin (VANCOREADY) IVPB 1750 mg/350 mL        1,750 mg 175 mL/hr over 120 Minutes Intravenous  Once 09/06/21 1310 09/06/21 2256        MEDICATIONS: Scheduled Meds:  Chlorhexidine Gluconate Cloth  6 each Topical Daily   feeding supplement (NEPRO CARB STEADY)  237 mL Oral TID BM   folic acid  2 mg Oral Daily   Gerhardt's butt cream   Topical BID   melatonin  5 mg Oral QHS   nicotine  14 mg Transdermal Q24H   pantoprazole  40 mg Oral BID AC   sodium bicarbonate  1,300 mg Oral TID   tamsulosin  0.4 mg Oral Daily   Continuous Infusions:  sodium chloride     sodium chloride 10 mL/hr at 08/27/21 2147   pencillin G potassium IV 4 Million Units (09/08/21 1423)   sodium chloride irrigation     PRN Meds:.acetaminophen, ALPRAZolam, bisacodyl, diphenhydrAMINE, lidocaine, ondansetron **OR** ondansetron (ZOFRAN) IV, oxyCODONE, promethazine, technetium albumin aggregated, traMADol, traZODone   PHYSICAL EXAM: Vital signs: Vitals:   09/08/21 0545 09/08/21 0801 09/08/21 0900 09/08/21 1413  BP: 107/69 116/73 122/76 130/78  Pulse: 65 64 76 75  Resp: 14 16 16 19   Temp: 97.7 F (36.5 C) 97.7 F (36.5 C) 98 F (36.7 C) 97.6 F (36.4 C)  TempSrc: Oral Oral Oral Oral  SpO2: 96% 96% 98% 96%  Weight:      Height:       Filed Weights   08/23/21 0816 09/07/21 1431 09/07/21 1615  Weight: 81.6 kg 71.7 kg 71 kg   Body mass index is 22.46 kg/m.   General: NAD  Cardiovascular: S1, S2 present Respiratory: CTAB Abdomen: Soft, nontender, nondistended, bowel sounds  present Musculoskeletal: No bilateral pedal edema noted Skin: Normal Psychiatry: Normal mood        I have personally reviewed following labs and imaging studies  LABORATORY DATA: CBC: Recent Labs  Lab 09/04/21 0818 09/05/21 0302 09/06/21 0317 09/07/21 0125 09/08/21 0530  WBC 8.0 8.5 9.0 10.9* 7.5  HGB 9.5* 9.6* 9.6* 9.6* 9.3*  HCT 30.0* 30.3* 30.7* 30.6* 29.5*  MCV 84.5 84.6 85.0 85.2 85.0  PLT 321 380 380 370 827    Basic Metabolic Panel: Recent Labs  Lab 09/04/21 0256 09/05/21 0302 09/06/21 0317 09/07/21 0125 09/08/21 0530  NA 138 136 138 134* 133*  K 3.4* 3.6 4.0 3.9 3.3*  CL 101 101 102 98 98  CO2 27 26 25 23  23  GLUCOSE 106* 87 95 89 105*  BUN 37* 34* 33* 36* 32*  CREATININE 5.77* 5.85* 5.97* 6.39* 5.52*  CALCIUM 8.2* 8.1* 8.1* 8.0* 7.8*  MG  --   --   --   --  1.7  PHOS 4.8* 3.8 4.5 5.6* 3.9    GFR: Estimated Creatinine Clearance: 12.3 mL/min (A) (by C-G formula based on SCr of 5.52 mg/dL (H)).  Liver Function Tests: Recent Labs  Lab 09/04/21 0256 09/05/21 0302 09/06/21 0317 09/07/21 0125 09/08/21 0530  ALBUMIN 1.8* 1.8* 1.8* 1.7* 1.6*   No results for input(s): LIPASE, AMYLASE in the last 168 hours.  No results for input(s): AMMONIA in the last 168 hours.  Coagulation Profile: No results for input(s): INR, PROTIME in the last 168 hours.   Cardiac Enzymes: No results for input(s): CKTOTAL, CKMB, CKMBINDEX, TROPONINI in the last 168 hours.  BNP (last 3 results) No results for input(s): PROBNP in the last 8760 hours.  Lipid Profile: No results for input(s): CHOL, HDL, LDLCALC, TRIG, CHOLHDL, LDLDIRECT in the last 72 hours.  Thyroid Function Tests: No results for input(s): TSH, T4TOTAL, FREET4, T3FREE, THYROIDAB in the last 72 hours.  Anemia Panel: No results for input(s): VITAMINB12, FOLATE, FERRITIN, TIBC, IRON, RETICCTPCT in the last 72 hours.   Urine analysis:    Component Value Date/Time   COLORURINE RED (A) 09/06/2021  1229   APPEARANCEUR TURBID (A) 09/06/2021 1229   LABSPEC  09/06/2021 1229    TEST NOT REPORTED DUE TO COLOR INTERFERENCE OF URINE PIGMENT   PHURINE  09/06/2021 1229    TEST NOT REPORTED DUE TO COLOR INTERFERENCE OF URINE PIGMENT   GLUCOSEU (A) 09/06/2021 1229    TEST NOT REPORTED DUE TO COLOR INTERFERENCE OF URINE PIGMENT   HGBUR (A) 09/06/2021 1229    TEST NOT REPORTED DUE TO COLOR INTERFERENCE OF URINE PIGMENT   BILIRUBINUR (A) 09/06/2021 1229    TEST NOT REPORTED DUE TO COLOR INTERFERENCE OF URINE PIGMENT   KETONESUR (A) 09/06/2021 1229    TEST NOT REPORTED DUE TO COLOR INTERFERENCE OF URINE PIGMENT   PROTEINUR (A) 09/06/2021 1229    TEST NOT REPORTED DUE TO COLOR INTERFERENCE OF URINE PIGMENT   UROBILINOGEN 0.2 06/26/2007 1023   NITRITE (A) 09/06/2021 1229    TEST NOT REPORTED DUE TO COLOR INTERFERENCE OF URINE PIGMENT   LEUKOCYTESUR (A) 09/06/2021 1229    TEST NOT REPORTED DUE TO COLOR INTERFERENCE OF URINE PIGMENT    Sepsis Labs: Lactic Acid, Venous    Component Value Date/Time   LATICACIDVEN 0.8 09/06/2021 1512    MICROBIOLOGY: Recent Results (from the past 240 hour(s))  Urine Culture     Status: Abnormal   Collection Time: 09/06/21 12:29 PM   Specimen: Urine, Clean Catch  Result Value Ref Range Status   Specimen Description URINE, CLEAN CATCH  Final   Special Requests NONE  Final   Culture (A)  Final    >=100,000 COLONIES/mL GROUP B STREP(S.AGALACTIAE)ISOLATED TESTING AGAINST S. AGALACTIAE NOT ROUTINELY PERFORMED DUE TO PREDICTABILITY OF AMP/PEN/VAN SUSCEPTIBILITY. Performed at [facility], [place] [address] [place], [place] [address]    Report Status 09/07/2021 FINAL  Final  Culture, blood (routine x 2)     Status: Abnormal (Preliminary result)   Collection Time: 09/06/21 12:34 PM   Specimen: BLOOD RIGHT HAND  Result Value Ref Range Status   Specimen Description BLOOD RIGHT HAND  Final   Special Requests   Final    BOTTLES DRAWN AEROBIC AND  ANAEROBIC  Blood Culture adequate volume   Culture  Setup Time   Final    GRAM POSITIVE COCCI IN CHAINS IN BOTH AEROBIC AND ANAEROBIC BOTTLES CRITICAL RESULT CALLED TO, READ BACK BY AND VERIFIED WITH: V BRYK,PHARMD@[id number]  09/07/21 [place]    Culture (A)  Final    STREPTOCOCCUS AGALACTIAE SUSCEPTIBILITIES TO FOLLOW Performed at [facility], [address] [place], [place] [address]    Report Status PENDING  Incomplete  Blood Culture ID Panel (Reflexed)     Status: Abnormal   Collection Time: 09/06/21 12:34 PM  Result Value Ref Range Status   Enterococcus faecalis NOT DETECTED NOT DETECTED Final   Enterococcus Faecium NOT DETECTED NOT DETECTED Final   Listeria monocytogenes NOT DETECTED NOT DETECTED Final   Staphylococcus species NOT DETECTED NOT DETECTED Final   Staphylococcus aureus (BCID) NOT DETECTED NOT DETECTED Final   Staphylococcus epidermidis NOT DETECTED NOT DETECTED Final   Staphylococcus lugdunensis NOT DETECTED NOT DETECTED Final   Streptococcus species DETECTED (A) NOT DETECTED Final    Comment: CRITICAL RESULT CALLED TO, READ BACK BY AND VERIFIED WITH: V BRYK,PHARMD@[id number]  09/07/21 [place]    Streptococcus agalactiae DETECTED (A) NOT DETECTED Final    Comment: CRITICAL RESULT CALLED TO, READ BACK BY AND VERIFIED WITH: V BRYK,PHARMD@[id number]  09/07/21 [place]    Streptococcus pneumoniae NOT DETECTED NOT DETECTED Final   Streptococcus pyogenes NOT DETECTED NOT DETECTED Final   A.calcoaceticus-baumannii NOT DETECTED NOT DETECTED Final   Bacteroides fragilis NOT DETECTED NOT DETECTED Final   Enterobacterales NOT DETECTED NOT DETECTED Final   Enterobacter cloacae complex NOT DETECTED NOT DETECTED Final   Escherichia coli NOT DETECTED NOT DETECTED Final   Klebsiella aerogenes NOT DETECTED NOT DETECTED Final   Klebsiella oxytoca NOT DETECTED NOT DETECTED Final   Klebsiella pneumoniae NOT DETECTED NOT DETECTED Final   Proteus species NOT DETECTED NOT DETECTED Final   Salmonella species NOT  DETECTED NOT DETECTED Final   Serratia marcescens NOT DETECTED NOT DETECTED Final   Haemophilus influenzae NOT DETECTED NOT DETECTED Final   Neisseria meningitidis NOT DETECTED NOT DETECTED Final   Pseudomonas aeruginosa NOT DETECTED NOT DETECTED Final   Stenotrophomonas maltophilia NOT DETECTED NOT DETECTED Final   Candida albicans NOT DETECTED NOT DETECTED Final   Candida auris NOT DETECTED NOT DETECTED Final   Candida glabrata NOT DETECTED NOT DETECTED Final   Candida krusei NOT DETECTED NOT DETECTED Final   Candida parapsilosis NOT DETECTED NOT DETECTED Final   Candida tropicalis NOT DETECTED NOT DETECTED Final   Cryptococcus neoformans/gattii NOT DETECTED NOT DETECTED Final    Comment: Performed at [facility], [address], [place], [place] [address]  Culture, blood (routine x 2)     Status: Abnormal (Preliminary result)   Collection Time: 09/06/21 12:46 PM   Specimen: BLOOD RIGHT HAND  Result Value Ref Range Status   Specimen Description BLOOD RIGHT HAND  Final   Special Requests   Final    BOTTLES DRAWN AEROBIC AND ANAEROBIC Blood Culture adequate volume   Culture  Setup Time   Final    GRAM POSITIVE COCCI IN CHAINS IN BOTH AEROBIC AND ANAEROBIC BOTTLES CRITICAL VALUE NOTED.  VALUE IS CONSISTENT WITH PREVIOUSLY REPORTED AND CALLED VALUE.    Culture (A)  Final    STREPTOCOCCUS AGALACTIAE CULTURE REINCUBATED FOR BETTER GROWTH Performed at [facility], [place] [address], [place], [place] [address]    Report Status PENDING  Incomplete     RADIOLOGY STUDIES/RESULTS: DG Bone  Survey Met  Result Date: 09/08/2021 CLINICAL DATA:  Left hip pain. EXAM: METASTATIC BONE SURVEY COMPARISON:  None. FINDINGS: No metastatic lesions identified. No cause for left hip pain identified. IMPRESSION: No acute abnormalities identified. No metastatic lesions. No cause for hip pain noted. Electronically Signed   By: Dorise Bullion III M.D.   On: 09/08/2021 14:32   ECHOCARDIOGRAM  COMPLETE  Result Date: 09/08/2021    ECHOCARDIOGRAM REPORT   Patient Name:   Caleb Simmons Date of Exam: 09/08/2021 Medical Rec #:  [id number]    Height:       70.0 in Accession #:    [id number]   Weight:       156.5 lb Date of Birth:  1951/08/02     BSA:          1.881 m Patient Age:    68 years     BP:           130/78 mmHg Patient Gender: M            HR:           75 bpm. Exam Location:  Inpatient Procedure: 2D Echo, Cardiac Doppler and Color Doppler Indications:    Bacteremia R78.81  History:        Patient has prior history of Echocardiogram examinations, most                 recent 08/24/2021. Risk Factors:Current Smoker. Generalized                 weakness/shortness of breath-found to have AKI and severe                 normocytic anemia.  Sonographer:    Alvino Chapel RCS Referring Phys: [id number] [place]  1. Left ventricular ejection fraction, by estimation, is 45 to 50%. The left ventricle has mildly decreased function. The left ventricle demonstrates global hypokinesis. There is moderate concentric left ventricular hypertrophy. Left ventricular diastolic parameters are consistent with Grade II diastolic dysfunction (pseudonormalization).  2. Right ventricular systolic function is normal. The right ventricular size is normal. Tricuspid regurgitation signal is inadequate for assessing PA pressure.  3. Left atrial size was moderately dilated.  4. A small to moderate pericardial effusion is present. The pericardial effusion is anterior to the right ventricle. There is no evidence of cardiac tamponade.  5. The mitral valve is grossly normal. Mild mitral valve regurgitation.  6. The aortic valve is tricuspid. There is mild thickening of the aortic valve. Aortic valve regurgitation is not visualized.  7. The inferior vena cava is normal in size with greater than 50% respiratory variability, suggesting right atrial pressure of 3 mmHg.  8. No obvious valvular vegetations. Comparison(s): Prior  images reviewed side by side. LVEF mildly reduced and pericardial effusion is new. FINDINGS  Left Ventricle: Left ventricular ejection fraction, by estimation, is 45 to 50%. The left ventricle has mildly decreased function. The left ventricle demonstrates global hypokinesis. The left ventricular internal cavity size was normal in size. There is  moderate concentric left ventricular hypertrophy. Left ventricular diastolic parameters are consistent with Grade II diastolic dysfunction (pseudonormalization). Right Ventricle: The right ventricular size is normal. No increase in right ventricular wall thickness. Right ventricular systolic function is normal. Tricuspid regurgitation signal is inadequate for assessing PA pressure. Left Atrium: Left atrial size was moderately dilated. Right Atrium: Right atrial size was normal in size. Pericardium: A small pericardial effusion is present. The pericardial effusion  is anterior to the right ventricle. There is no evidence of cardiac tamponade. Mitral Valve: The mitral valve is grossly normal. There is mild thickening of the mitral valve leaflet(s). Mild mitral annular calcification. Mild mitral valve regurgitation. Tricuspid Valve: The tricuspid valve is grossly normal. Tricuspid valve regurgitation is trivial. Aortic Valve: The aortic valve is tricuspid. There is mild thickening of the aortic valve. There is mild to moderate aortic valve annular calcification. Aortic valve regurgitation is not visualized. Pulmonic Valve: The pulmonic valve was grossly normal. Pulmonic valve regurgitation is trivial. Aorta: The aortic root is normal in size and structure. Venous: The inferior vena cava is normal in size with greater than 50% respiratory variability, suggesting right atrial pressure of 3 mmHg. IAS/Shunts: No atrial level shunt detected by color flow Doppler.  LEFT VENTRICLE PLAX 2D LVIDd:         5.10 cm  Diastology LVIDs:         3.90 cm  LV e' medial:    3.77 cm/s LV PW:          1.50 cm  LV E/e' medial:  22.9 LV IVS:        1.40 cm  LV e' lateral:   7.18 cm/s LVOT diam:     2.00 cm  LV E/e' lateral: 12.0 LV SV:         51 LV SV Index:   27 LVOT Area:     3.14 cm  RIGHT VENTRICLE RV S prime:     14.40 cm/s TAPSE (M-mode): 1.9 cm LEFT ATRIUM              Index       RIGHT ATRIUM           Index LA diam:        4.20 cm  2.23 cm/m  RA Area:     14.50 cm LA Vol (A2C):   104.0 ml 55.29 ml/m RA Volume:   36.00 ml  19.14 ml/m LA Vol (A4C):   64.4 ml  34.24 ml/m LA Biplane Vol: 83.7 ml  44.50 ml/m  AORTIC VALVE LVOT Vmax:   89.00 cm/s LVOT Vmean:  53.600 cm/s LVOT VTI:    0.161 m  AORTA Ao Root diam: 3.50 cm MITRAL VALVE MV Area (PHT): 3.65 cm    SHUNTS MV Decel Time: 208 msec    Systemic VTI:  0.16 m MV E velocity: 86.40 cm/s  Systemic Diam: 2.00 cm MV A velocity: 63.70 cm/s MV E/A ratio:  1.36 Rozann Lesches MD Electronically signed by Rozann Lesches MD Signature Date/Time: 09/08/2021/5:26:36 PM    Final      LOS: 16 days   Alma Friendly, MD  [facility]    09/08/2021, 5:33 PM

## 2021-09-08 NOTE — Progress Notes (Signed)
[facility] NEPHROLOGY PROGRESS NOTE  Assessment/ Plan:  #Acute kidney injury, nonoliguric, with history of excessive NSAID's use/BC powders every day.  Peaked creatinine level of 6.95.  CT scan without obstruction or hydronephrosis.  UA with more than 300 protein and microscopic hematuria.  Serology evaluation including ANA, ANCA, C3, C4, SPEP, anti-GBM, double-stranded DNA, hep B, hep C, HIV negative.   -He underwent IR guided kidney biopsy on 9/6 complicated by postbiopsy bleeding. Biopsy: AL amyloidosis, lambda light chain composition (glomeruli, interstitium, arteries, and arterioles). Marked IF, 59% global glomerular sclerosis and marked arteriosclerosis. Final read/EM pending. -He had right IJ temporary HD catheter placed on 9/8 in IR during embolization of the bleeding artery.   -Suspected uremia given persistent lack of appetite, dysgeusia, hiccups therefore proceeded with HD on 9/16 (slow start protocol). Symptoms are remarkably better. Holding on HD, not sure if he will require another treatment in the near future therefore would recommend maintaining HD catheter -Check daily lab,s strict ins and out  #Post kidney biopsy bleeding, syncope/acute blood loss anemia: He initially received continuous bladder irrigation.  Because of ongoing bleeding, he underwent embolization of right inferior pole arcuate artery and right IJ temporary HD catheter placement by IR on 9/8.  Still having some hematuria, not on CBI at the moment, urology is following; defer to them.  Monitor CBC.  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 Likely urinary source -iv pcn, per primary  #Pulm edema -on CXR 9/12, IVF stopped, received lasix 34m IV x 1 dose, prn diuresis. No need for  diuretics today  #Anemia, complicated by acute blood loss post kidney biopsy: Urology and IR is following.  Received multiple units of blood transfusion.  Hemoglobin is stable today.On admission FOBT was negative and treated with IV iron.  #History of nephrolithiasis: Repeat CT scan without any obstruction or hydronephrosis.  Urology is following  #Metabolic acidosis: Continue oral sodium bicarbonate, stable CO2 level.  Subjective: Seen and examined bedside. No acute events. Tolerated hd yesterday. He finally feels hungry and he feels like his appetite is coming back, no longer having dysgeusia which is great news. Was able to walk up and down the hallway this AM. Uop ~1.7L  Objective Vital signs in last 24 hours: Vitals:   09/08/21 0005 09/08/21 0545 09/08/21 0801 09/08/21 0900  BP: 112/71 107/69 116/73 122/76  Pulse: 73 65 64 76  Resp: _0 Temp: 97.9 F (36.6 C) 97.7 F (36.5 C) 97.7 F (36.5 C) 98 F (36.7 C)  TempSrc: Oral Oral Oral Oral  SpO2: 95% 96% 96% 98%  Weight:      Height:       Weight change:   Intake/Output Summary (Last 24 hours) at 09/08/2021 1146 Last data filed at 09/08/2021 0550 Gross per 24 hour  Intake 500 ml  Output 1850 ml  Net -1350 ml       Labs: Basic Metabolic Panel: Recent Labs  Lab 09/06/21 0317 09/07/21 0125 09/08/21 0530  NA 138 134* 133*  K 4.0 3.9 3.3*  CL 102 98 98  CO2 _1 GLUCOSE 95 89 105*  BUN 33* 36* 32*  CREATININE 5.97* 6.39* 5.52*  CALCIUM 8.1* 8.0* 7.8*  PHOS 4.5 5.6* 3.9   Liver Function Tests: Recent Labs  Lab 09/06/21  6659 09/07/21 0125 09/08/21 0530  ALBUMIN 1.8* 1.7* 1.6*   No results for input(s): LIPASE, AMYLASE in the last 168 hours.  No results for input(s): AMMONIA in the last 168 hours. CBC: Recent Labs  Lab 09/04/21 0818 09/05/21 0302 09/06/21 0317 09/07/21 0125 09/08/21 0530  WBC 8.0 8.5 9.0 10.9* 7.5  HGB 9.5* 9.6* 9.6* 9.6* 9.3*  HCT 30.0* 30.3* 30.7* 30.6* 29.5*   MCV 84.5 84.6 85.0 85.2 85.0  PLT 321 380 380 370 380   Cardiac Enzymes: No results for input(s): CKTOTAL, CKMB, CKMBINDEX, TROPONINI in the last 168 hours. CBG: No results for input(s): GLUCAP in the last 168 hours.  Iron Studies: No results for input(s): IRON, TIBC, TRANSFERRIN, FERRITIN in the last 72 hours. Studies/Results: DG Chest 2 View  Result Date: 09/06/2021 CLINICAL DATA:  Fever EXAM: CHEST - 2 VIEW COMPARISON:  09/03/2021 FINDINGS: Right IJ dialysis catheter tip med right atrium. Midline trachea. Normal heart size. Left costophrenic angle minimally excluded the frontal radiograph. Small bilateral pleural effusions. No pneumothorax. Improved interstitial edema with minimal pulmonary venous congestion remaining. Persistent mild bibasilar atelectasis. IMPRESSION: Improved interstitial edema with mild pulmonary venous congestion remaining. Small bilateral pleural effusions with adjacent atelectasis. Electronically Signed   By: Abigail Miyamoto M.D.   On: 09/06/2021 13:49    Medications: Infusions:  sodium chloride     sodium chloride 10 mL/hr at 08/27/21 2147   pencillin G potassium IV 4 Million Units (09/08/21 0217)   sodium chloride irrigation      Scheduled Medications:  Chlorhexidine Gluconate Cloth  6 each Topical Daily   feeding supplement (NEPRO CARB STEADY)  237 mL Oral TID BM   folic acid  2 mg Oral Daily   melatonin  5 mg Oral QHS   nicotine  14 mg Transdermal Q24H   pantoprazole  40 mg Oral BID AC   sodium bicarbonate  1,300 mg Oral TID   tamsulosin  0.4 mg Oral Daily    have reviewed scheduled and prn medications.  Physical Exam: General: nad, sitting in chair Heart: rrr Lungs: normal wob, cta bl Abdomen: Soft, nontender Extremities: No LE edema. Neurology: Alert, awake, following commands, no asterixis Vascular Access: Right IJ temporary HD catheter in place.  Gizel Riedlinger 09/08/2021,11:46 AM  LOS: 16 days

## 2021-09-09 DIAGNOSIS — N179 Acute kidney failure, unspecified: Secondary | ICD-10-CM | POA: Diagnosis not present

## 2021-09-09 DIAGNOSIS — R7881 Bacteremia: Secondary | ICD-10-CM | POA: Diagnosis not present

## 2021-09-09 DIAGNOSIS — E854 Organ-limited amyloidosis: Secondary | ICD-10-CM | POA: Diagnosis not present

## 2021-09-09 DIAGNOSIS — N390 Urinary tract infection, site not specified: Secondary | ICD-10-CM | POA: Diagnosis not present

## 2021-09-09 LAB — CBC
HCT: 27.9 % — ABNORMAL LOW (ref 39.0–52.0)
Hemoglobin: 9.3 g/dL — ABNORMAL LOW (ref 13.0–17.0)
MCH: 27.3 pg (ref 26.0–34.0)
MCHC: 33.3 g/dL (ref 30.0–36.0)
MCV: 81.8 fL (ref 80.0–100.0)
Platelets: 389 10*3/uL (ref 150–400)
RBC: 3.41 MIL/uL — ABNORMAL LOW (ref 4.22–5.81)
RDW: 17.2 % — ABNORMAL HIGH (ref 11.5–15.5)
WBC: 12.7 10*3/uL — ABNORMAL HIGH (ref 4.0–10.5)
nRBC: 0 % (ref 0.0–0.2)

## 2021-09-09 LAB — CULTURE, BLOOD (ROUTINE X 2)
Special Requests: ADEQUATE
Special Requests: ADEQUATE

## 2021-09-09 LAB — RENAL FUNCTION PANEL
Albumin: 1.7 g/dL — ABNORMAL LOW (ref 3.5–5.0)
Anion gap: 15 (ref 5–15)
BUN: 40 mg/dL — ABNORMAL HIGH (ref 8–23)
CO2: 20 mmol/L — ABNORMAL LOW (ref 22–32)
Calcium: 7.6 mg/dL — ABNORMAL LOW (ref 8.9–10.3)
Chloride: 95 mmol/L — ABNORMAL LOW (ref 98–111)
Creatinine, Ser: 5.43 mg/dL — ABNORMAL HIGH (ref 0.61–1.24)
GFR, Estimated: 11 mL/min — ABNORMAL LOW (ref >60)
Glucose, Bld: 226 mg/dL — ABNORMAL HIGH (ref 70–99)
Phosphorus: 2.3 mg/dL — ABNORMAL LOW (ref 2.5–4.6)
Potassium: 4.1 mmol/L (ref 3.5–5.1)
Sodium: 130 mmol/L — ABNORMAL LOW (ref 135–145)

## 2021-09-09 LAB — BRAIN NATRIURETIC PEPTIDE: B Natriuretic Peptide: 2567.7 pg/mL — ABNORMAL HIGH (ref 0.0–100.0)

## 2021-09-09 MED ORDER — CHLORPROMAZINE HCL 25 MG PO TABS
25.0000 mg | ORAL_TABLET | Freq: Once | ORAL | Status: AC
  Administered 2021-09-09: 25 mg via ORAL
  Filled 2021-09-09: qty 1

## 2021-09-09 NOTE — Progress Notes (Signed)
[facility] NEPHROLOGY PROGRESS NOTE  Assessment/ Plan:  #Acute kidney injury, nonoliguric, with history of excessive NSAID's use/BC powders every day.  Peaked creatinine level of 6.95.  CT scan without obstruction or hydronephrosis.  UA with more than 300 protein and microscopic hematuria.  Serology evaluation including ANA, ANCA, C3, C4, SPEP, anti-GBM, double-stranded DNA, hep B, hep C, HIV negative.   -He underwent IR guided kidney biopsy on 9/6 complicated by postbiopsy bleeding. Biopsy: AL amyloidosis, lambda light chain composition (glomeruli, interstitium, arteries, and arterioles). Marked IF, 59% global glomerular sclerosis and marked arteriosclerosis. Final read/EM pending. -He had right IJ temporary HD catheter placed on 9/8 in IR during embolization of the bleeding artery.   -Suspected uremia given persistent lack of appetite, dysgeusia, hiccups therefore proceeded with HD on 9/16 (slow start protocol). Symptoms were remarkably better and is still the case today. Will hold on HD tomorrow especially given planned onc treatments tomorrow..Not sure if he will require another treatment in the near future therefore would recommend maintaining HD catheter -Check daily lab,s strict ins and out  #Post kidney biopsy bleeding, syncope/acute blood loss anemia: He initially received continuous bladder irrigation.  Because of ongoing bleeding, he underwent embolization of right inferior pole arcuate artery and right IJ temporary HD catheter placement by IR on 9/8.  Still having some hematuria, not on CBI at the moment, urology is following; defer to them.  Monitor CBC.  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 Likely urinary source -iv pcn, per primary  #Pulm  edema -on CXR 9/12, IVF stopped, received lasix 7m IV x 1 dose, prn diuresis. No need for diuretics today  #Anemia, complicated by acute blood loss post kidney biopsy: Urology and IR is following.  Received multiple units of blood transfusion.  Hemoglobin is stable today.On admission FOBT was negative and treated with IV iron. Holding on ESA for now given amyloidosis  #History of nephrolithiasis: Repeat CT scan without any obstruction or hydronephrosis.  Urology is following  #Metabolic acidosis: Continue oral sodium bicarbonate, monitor  Subjective: Seen and examined bedside. No acute events. He is thrilled that his appetite is better, had a big breakfast. Still not having any more dysgeusia. Did PT today. Denies any nausea, vomiting, hiccups, itchiness, brain fog. Son at bedside.  Objective Vital signs in last 24 hours: Vitals:   09/08/21 2007 09/08/21 2348 09/09/21 0455 09/09/21 0817  BP: (!) 142/86 139/86 (!) 141/91 118/72  Pulse: 77 68 64 70  Resp: 20 17 19 19   Temp: 97.8 F (36.6 C) 97.7 F (36.5 C) 97.7 F (36.5 C)   TempSrc: Oral Oral Oral   SpO2: 95% 99% 99% 95%  Weight:      Height:       Weight change:   Intake/Output Summary (Last 24 hours) at 09/09/2021 1032 Last data filed at 09/09/2021 0700 Gross per 24 hour  Intake 1222 ml  Output 2575 ml  Net -1353 ml       Labs: Basic Metabolic Panel: Recent Labs  Lab 09/07/21 0125 09/08/21 0530 09/09/21 0336  NA 134* 133* 130*  K 3.9 3.3* 4.1  CL 98 98 95*  CO2 23 23 20*  GLUCOSE 89 105* 226*  BUN 36* 32* 40*  CREATININE 6.39* 5.52* 5.43*  CALCIUM 8.0* 7.8* 7.6*  PHOS 5.6*  3.9 2.3*   Liver Function Tests: Recent Labs  Lab 09/07/21 0125 09/08/21 0530 09/09/21 0336  ALBUMIN 1.7* 1.6* 1.7*   No results for input(s): LIPASE, AMYLASE in the last 168 hours.  No results for input(s): AMMONIA in the last 168 hours. CBC: Recent Labs  Lab 09/05/21 0302 09/06/21 0317 09/07/21 0125 09/08/21 0530  09/09/21 0336  WBC 8.5 9.0 10.9* 7.5 12.7*  HGB 9.6* 9.6* 9.6* 9.3* 9.3*  HCT 30.3* 30.7* 30.6* 29.5* 27.9*  MCV 84.6 85.0 85.2 85.0 81.8  PLT 380 380 370 380 389   Cardiac Enzymes: No results for input(s): CKTOTAL, CKMB, CKMBINDEX, TROPONINI in the last 168 hours. CBG: No results for input(s): GLUCAP in the last 168 hours.  Iron Studies: No results for input(s): IRON, TIBC, TRANSFERRIN, FERRITIN in the last 72 hours. Studies/Results: DG Bone Survey Met  Result Date: 09/08/2021 CLINICAL DATA:  Left hip pain. EXAM: METASTATIC BONE SURVEY COMPARISON:  None. FINDINGS: No metastatic lesions identified. No cause for left hip pain identified. IMPRESSION: No acute abnormalities identified. No metastatic lesions. No cause for hip pain noted. Electronically Signed   By: Dorise Bullion III M.D.   On: 09/08/2021 14:32   ECHOCARDIOGRAM COMPLETE  Result Date: 09/08/2021    ECHOCARDIOGRAM REPORT   Patient Name:   Caleb Simmons Date of Exam: 09/08/2021 Medical Rec #:  [id number]    Height:       70.0 in Accession #:    [id number]   Weight:       156.5 lb Date of Birth:  [birth year]     BSA:          1.881 m Patient Age:    70 years     BP:           130/78 mmHg Patient Gender: M            HR:           75 bpm. Exam Location:  Inpatient Procedure: 2D Echo, Cardiac Doppler and Color Doppler Indications:    Bacteremia R78.81  History:        Patient has prior history of Echocardiogram examinations, most                 recent 08/24/2021. Risk Factors:Current Smoker. Generalized                 weakness/shortness of breath-found to have AKI and severe                 normocytic anemia.  Sonographer:    Alvino Chapel RCS Referring Phys: [id number] [place]  1. Left ventricular ejection fraction, by estimation, is 45 to 50%. The left ventricle has mildly decreased function. The left ventricle demonstrates global hypokinesis. There is moderate concentric left ventricular hypertrophy. Left ventricular  diastolic parameters are consistent with Grade II diastolic dysfunction (pseudonormalization).  2. Right ventricular systolic function is normal. The right ventricular size is normal. Tricuspid regurgitation signal is inadequate for assessing PA pressure.  3. Left atrial size was moderately dilated.  4. A small to moderate pericardial effusion is present. The pericardial effusion is anterior to the right ventricle. There is no evidence of cardiac tamponade.  5. The mitral valve is grossly normal. Mild mitral valve regurgitation.  6. The aortic valve is tricuspid. There is mild thickening of the aortic valve. Aortic valve regurgitation is not visualized.  7. The inferior vena cava is normal in size with greater than 50% respiratory  variability, suggesting right atrial pressure of 3 mmHg.  8. No obvious valvular vegetations. Comparison(s): Prior images reviewed side by side. LVEF mildly reduced and pericardial effusion is new. FINDINGS  Left Ventricle: Left ventricular ejection fraction, by estimation, is 45 to 50%. The left ventricle has mildly decreased function. The left ventricle demonstrates global hypokinesis. The left ventricular internal cavity size was normal in size. There is  moderate concentric left ventricular hypertrophy. Left ventricular diastolic parameters are consistent with Grade II diastolic dysfunction (pseudonormalization). Right Ventricle: The right ventricular size is normal. No increase in right ventricular wall thickness. Right ventricular systolic function is normal. Tricuspid regurgitation signal is inadequate for assessing PA pressure. Left Atrium: Left atrial size was moderately dilated. Right Atrium: Right atrial size was normal in size. Pericardium: A small pericardial effusion is present. The pericardial effusion is anterior to the right ventricle. There is no evidence of cardiac tamponade. Mitral Valve: The mitral valve is grossly normal. There is mild thickening of the mitral valve  leaflet(s). Mild mitral annular calcification. Mild mitral valve regurgitation. Tricuspid Valve: The tricuspid valve is grossly normal. Tricuspid valve regurgitation is trivial. Aortic Valve: The aortic valve is tricuspid. There is mild thickening of the aortic valve. There is mild to moderate aortic valve annular calcification. Aortic valve regurgitation is not visualized. Pulmonic Valve: The pulmonic valve was grossly normal. Pulmonic valve regurgitation is trivial. Aorta: The aortic root is normal in size and structure. Venous: The inferior vena cava is normal in size with greater than 50% respiratory variability, suggesting right atrial pressure of 3 mmHg. IAS/Shunts: No atrial level shunt detected by color flow Doppler.  LEFT VENTRICLE PLAX 2D LVIDd:         5.10 cm  Diastology LVIDs:         3.90 cm  LV e' medial:    3.77 cm/s LV PW:         1.50 cm  LV E/e' medial:  22.9 LV IVS:        1.40 cm  LV e' lateral:   7.18 cm/s LVOT diam:     2.00 cm  LV E/e' lateral: 12.0 LV SV:         51 LV SV Index:   27 LVOT Area:     3.14 cm  RIGHT VENTRICLE RV S prime:     14.40 cm/s TAPSE (M-mode): 1.9 cm LEFT ATRIUM              Index       RIGHT ATRIUM           Index LA diam:        4.20 cm  2.23 cm/m  RA Area:     14.50 cm LA Vol (A2C):   104.0 ml 55.29 ml/m RA Volume:   36.00 ml  19.14 ml/m LA Vol (A4C):   64.4 ml  34.24 ml/m LA Biplane Vol: 83.7 ml  44.50 ml/m  AORTIC VALVE LVOT Vmax:   89.00 cm/s LVOT Vmean:  53.600 cm/s LVOT VTI:    0.161 m  AORTA Ao Root diam: 3.50 cm MITRAL VALVE MV Area (PHT): 3.65 cm    SHUNTS MV Decel Time: 208 msec    Systemic VTI:  0.16 m MV E velocity: 86.40 cm/s  Systemic Diam: 2.00 cm MV A velocity: 63.70 cm/s MV E/A ratio:  1.36 Rozann Lesches MD Electronically signed by Rozann Lesches MD Signature Date/Time: 09/08/2021/5:26:36 PM    Final     Medications: Infusions:  sodium  chloride     sodium chloride 10 mL/hr at 08/27/21 2147   pencillin G potassium IV 4 Million Units  (09/09/21 0204)   sodium chloride irrigation      Scheduled Medications:  Chlorhexidine Gluconate Cloth  6 each Topical Daily   feeding supplement (NEPRO CARB STEADY)  237 mL Oral TID BM   folic acid  2 mg Oral Daily   Gerhardt's butt cream   Topical BID   melatonin  5 mg Oral QHS   nicotine  14 mg Transdermal Q24H   pantoprazole  40 mg Oral BID AC   sodium bicarbonate  1,300 mg Oral TID   tamsulosin  0.4 mg Oral Daily    have reviewed scheduled and prn medications.  Physical Exam: General: nad Heart: rrr Lungs: normal wob, cta bl Abdomen: Soft, nontender Extremities: No LE edema. Neurology: Alert, awake, following commands, no asterixis Vascular Access: Right IJ temporary HD catheter in place.  Clea Dubach 09/09/2021,10:32 AM  LOS: 17 days

## 2021-09-09 NOTE — Progress Notes (Signed)
PROGRESS NOTE        PATIENT DETAILS Name: Caleb Simmons Age: 70 y.o. Sex: male Date of Birth: 04-Dec-1951 Admit Date: 08/23/2021   Brief Narrative: Patient is a 70 y.o. male with history of nephrolithiasis-presenting with generalized weakness/shortness of breath-found to have AKI and severe normocytic anemia.  Patient was evaluated by nephrology, subsequently underwent right renal biopsy on 9/6 had postrenal biopsy bleeding into the renal collecting system-with clot retention requiring insertion of three-way catheter and CBI. Patient continued to have renal bleeding-on 9/8-IR performed embolization. On 9/10, received 2 units PRBC for hemoglobin of 6.6 and hematuria. On 9/12, renal biopsy showed amyloidosis. Oncology consulted for further management.   Antimicrobial therapy: None  Procedures : 9/06>> ultrasound-guided biopsy of right kidney 9/8>> 1) Right IJ temporary hemodialysis catheter placement 2) Right renal angiogram 3) Selective catheterization and embolization of right inferior pole arcuate artery  Consults: Nephrology, IR, urology, oncology  DVT Prophylaxis : Place and maintain sequential compression device Start: 09/01/21 1335 Place and maintain sequential compression device Start: 08/24/21 0519   Subjective: Pt reports no new complaints, noted to have hiccups while speaking with him. 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, plan to initiate treatment while inpatient on 09/09/21 Daily renal panel  Sepsis 2/2 Grp B strep bacteremia Grp B strep UTI Noted to spike temp 100.8 with chills,  tachycardic, leukocytosis BC X 2 Grp B strep, repeat pending Procalcitonin 0.76, will trend LA WNL UA with significant hematuria, many bacteria, >50 WBC, UC with grp B strep CXR unremarkable for infection TTE with no known vegetation S/p Cefepime, Vancomycin---> IV Penicillin  Discussed with Dr Juleen China ID on 09/09/21, plan to consult on 9/39/03 (as pt is complicated) Monitor closely  Acute combined CHF Small to moderate pericardial effusion Initial bnp 1300 Repeat ECHO to evaluate for vegetations due to bacteremia showed no vegetations but drop in EF to 45-50%, Grade 2DD, small to mod pericardial effusion Will consult cardiology   Hematuria-acute urinary retention (clot retention) due to bleeding into the collecting system of the right kidney post renal biopsy on 9/6-requiring three-way Foley catheter insertion/CBI and embolization by IR on 9/8 Normocytic anemia/acute blood loss anemia Management per urology, Foley catheter discontinued 9/8, he does remain with some mild hematuria He did require multiple transfusions Frequent CBC  Anxiety Continue Xanax while he is in the hospital-is aware that we will not continue Xanax post discharge.  Use trazodone for sleep.  Patient is not interested in starting long-acting medications like SSRI for his anxiety-thinks that once he gets home-he will not require any further medications for his anxiety issues.  Tobacco abuse Continue transdermal nicotine.     Diet: Diet Order             Diet regular Room service appropriate? Yes; Fluid consistency: Thin  Diet effective now           Diet - low sodium heart healthy                    Code Status:  Full code   Family Communication: None at bedside  DVT prophylaxis: SCDs  Disposition Plan: Status is: Inpatient  Remains inpatient appropriate because:Inpatient level of care appropriate due to severity of illness  Dispo: The patient is from: Home              Anticipated d/c  is to: Home              Patient currently is not medically stable to d/c.   Difficult to place patient No    Antimicrobial agents: Anti-infectives (From admission, onward)    Start     Dose/Rate Route Frequency Ordered Stop   09/07/21 1805  penicillin G potassium 4 Million Units in dextrose 5 % 250 mL IVPB        4 Million Units 250 mL/hr over 60 Minutes Intravenous Every 8 hours 09/07/21 1805     09/07/21 1400  penicillin G potassium 4 Million Units in dextrose 5 % 250 mL IVPB  Status:  Discontinued        4 Million Units 250 mL/hr over 60 Minutes Intravenous Every 8 hours 09/07/21 0827 09/07/21 1805   09/06/21 1400  ceFEPIme (MAXIPIME) 1 g in sodium chloride 0.9 % 100 mL IVPB  Status:  Discontinued        1 g 200 mL/hr over 30 Minutes Intravenous Every 24 hours 09/06/21 1307 09/07/21 0827   09/06/21 1400  vancomycin (VANCOREADY) IVPB 1750 mg/350 mL        1,750 mg 175 mL/hr over 120 Minutes Intravenous  Once 09/06/21 1310 09/06/21 2256        MEDICATIONS: Scheduled Meds:  Chlorhexidine Gluconate Cloth  6 each Topical Daily   feeding supplement (NEPRO CARB STEADY)  237 mL Oral TID BM   folic acid  2 mg Oral Daily   Gerhardt's butt cream   Topical BID   melatonin  5 mg Oral QHS   nicotine  14 mg Transdermal Q24H   pantoprazole  40 mg Oral BID AC   sodium bicarbonate  1,300 mg Oral TID   tamsulosin  0.4 mg Oral Daily   Continuous Infusions:  sodium chloride     sodium chloride 10 mL/hr at 08/27/21 2147   pencillin G potassium IV 4 Million Units (09/09/21 1051)   sodium chloride irrigation     PRN Meds:.acetaminophen, ALPRAZolam, bisacodyl, diphenhydrAMINE, lidocaine, ondansetron **OR** ondansetron (ZOFRAN) IV, oxyCODONE, promethazine, technetium albumin aggregated, traMADol, traZODone   PHYSICAL EXAM: Vital signs: Vitals:   09/09/21 0455 09/09/21 0800 09/09/21 0817 09/09/21 1357  BP: (!) 141/91  118/72 133/77  Pulse: 64 67 70 67  Resp: 19 20 19 16   Temp: 97.7 F  (36.5 C)     TempSrc: Oral     SpO2: 99% 95% 95% 97%  Weight:      Height:       Filed Weights   08/23/21 0816 09/07/21 1431 09/07/21 1615  Weight: 81.6 kg 71.7 kg 71 kg   Body mass index is 22.46 kg/m.   General: NAD  Cardiovascular: S1, S2 present Respiratory: CTAB Abdomen: Soft, nontender, nondistended, bowel sounds present Musculoskeletal: No bilateral pedal edema noted Skin: Normal Psychiatry: Normal mood        I have personally reviewed following labs and imaging studies  LABORATORY DATA: CBC: Recent Labs  Lab 09/05/21 0302 09/06/21 0317 09/07/21 0125 09/08/21 0530 09/09/21 0336  WBC 8.5 9.0 10.9* 7.5 12.7*  HGB 9.6* 9.6* 9.6* 9.3* 9.3*  HCT 30.3* 30.7* 30.6* 29.5* 27.9*  MCV 84.6 85.0 85.2 85.0 81.8  PLT 380 380 370 380 450    Basic Metabolic Panel: Recent Labs  Lab 09/05/21 0302 09/06/21 0317 09/07/21 0125 09/08/21 0530 09/09/21 0336  NA 136 138 134* 133* 130*  K 3.6 4.0 3.9 3.3* 4.1  CL 101 102 98 98 95*  CO2 26 25 23 23  20*  GLUCOSE 87 95 89 105* 226*  BUN 34* 33* 36* 32* 40*  CREATININE 5.85* 5.97* 6.39* 5.52* 5.43*  CALCIUM 8.1* 8.1* 8.0* 7.8* 7.6*  MG  --   --   --  1.7  --   PHOS 3.8 4.5 5.6* 3.9 2.3*    GFR: Estimated Creatinine Clearance: 12.5 mL/min (A) (by C-G formula based on SCr of 5.43 mg/dL (H)).  Liver Function Tests: Recent Labs  Lab 09/05/21 0302 09/06/21 0317 09/07/21 0125 09/08/21 0530 09/09/21 0336  ALBUMIN 1.8* 1.8* 1.7* 1.6* 1.7*   No results for input(s): LIPASE, AMYLASE in the last 168 hours.  No results for input(s): AMMONIA in the last 168 hours.  Coagulation Profile: No results for input(s): INR, PROTIME in the last 168 hours.   Cardiac Enzymes: No results for input(s): CKTOTAL, CKMB, CKMBINDEX, TROPONINI in the last 168 hours.  BNP (last 3 results) No results for input(s): PROBNP in the last 8760 hours.  Lipid Profile: No results for input(s): CHOL, HDL, LDLCALC, TRIG, CHOLHDL, LDLDIRECT  in the last 72 hours.  Thyroid Function Tests: No results for input(s): TSH, T4TOTAL, FREET4, T3FREE, THYROIDAB in the last 72 hours.  Anemia Panel: No results for input(s): VITAMINB12, FOLATE, FERRITIN, TIBC, IRON, RETICCTPCT in the last 72 hours.   Urine analysis:    Component Value Date/Time   COLORURINE RED (A) 09/06/2021 1229   APPEARANCEUR TURBID (A) 09/06/2021 1229   LABSPEC  09/06/2021 1229    TEST NOT REPORTED DUE TO COLOR INTERFERENCE OF URINE PIGMENT   PHURINE  09/06/2021 1229    TEST NOT REPORTED DUE TO COLOR INTERFERENCE OF URINE PIGMENT   GLUCOSEU (A) 09/06/2021 1229    TEST NOT REPORTED DUE TO COLOR INTERFERENCE OF URINE PIGMENT   HGBUR (A) 09/06/2021 1229    TEST NOT REPORTED DUE TO COLOR INTERFERENCE OF URINE PIGMENT   BILIRUBINUR (A) 09/06/2021 1229    TEST NOT REPORTED DUE TO COLOR INTERFERENCE OF URINE PIGMENT   KETONESUR (A) 09/06/2021 1229    TEST NOT REPORTED DUE TO COLOR INTERFERENCE OF URINE PIGMENT   PROTEINUR (A) 09/06/2021 1229    TEST NOT REPORTED DUE TO COLOR INTERFERENCE OF URINE PIGMENT   UROBILINOGEN 0.2 06/26/2007 1023   NITRITE (A) 09/06/2021 1229    TEST NOT REPORTED DUE TO COLOR INTERFERENCE OF URINE PIGMENT   LEUKOCYTESUR (A) 09/06/2021 1229    TEST NOT REPORTED DUE TO COLOR INTERFERENCE OF URINE PIGMENT    Sepsis Labs: Lactic Acid, Venous    Component Value Date/Time   LATICACIDVEN 0.8 09/06/2021 1512    MICROBIOLOGY: Recent Results (from the past 240 hour(s))  Urine Culture     Status: Abnormal   Collection Time: 09/06/21 12:29 PM   Specimen: Urine, Clean Catch  Result Value Ref Range Status   Specimen Description URINE, CLEAN CATCH  Final   Special Requests NONE  Final   Culture (A)  Final    >=100,000 COLONIES/mL GROUP B STREP(S.AGALACTIAE)ISOLATED TESTING AGAINST S. AGALACTIAE NOT ROUTINELY PERFORMED DUE TO PREDICTABILITY OF AMP/PEN/VAN SUSCEPTIBILITY. Performed at [facility], [place] [address], [place], [place]  [address]  Report Status 09/07/2021 FINAL  Final  Culture, blood (routine x 2)     Status: Abnormal   Collection Time: 09/06/21 12:34 PM   Specimen: BLOOD RIGHT HAND  Result Value Ref Range Status   Specimen Description BLOOD RIGHT HAND  Final   Special Requests   Final    BOTTLES DRAWN AEROBIC AND ANAEROBIC Blood Culture adequate volume   Culture  Setup Time   Final    GRAM POSITIVE COCCI IN CHAINS IN BOTH AEROBIC AND ANAEROBIC BOTTLES CRITICAL RESULT CALLED TO, READ BACK BY AND VERIFIED WITH: V BRYK,PHARMD@[id number]  09/07/21 [place] Performed at [facility], [place] [address], [place], [place] [address]    Culture STREPTOCOCCUS AGALACTIAE (A)  Final   Report Status 09/09/2021 FINAL  Final   Organism ID, Bacteria STREPTOCOCCUS AGALACTIAE  Final      Susceptibility   Streptococcus agalactiae - MIC*    CLINDAMYCIN >=1 RESISTANT Resistant     ERYTHROMYCIN >=8 RESISTANT Resistant     VANCOMYCIN 0.5 SENSITIVE Sensitive     CEFTRIAXONE <=0.12 SENSITIVE Sensitive     LEVOFLOXACIN 1 SENSITIVE Sensitive     PENICILLIN Value in next row Sensitive      SENSITIVE<=0.06    * STREPTOCOCCUS AGALACTIAE  Blood Culture ID Panel (Reflexed)     Status: Abnormal   Collection Time: 09/06/21 12:34 PM  Result Value Ref Range Status   Enterococcus faecalis NOT DETECTED NOT DETECTED Final   Enterococcus Faecium NOT DETECTED NOT DETECTED Final   Listeria monocytogenes NOT DETECTED NOT DETECTED Final   Staphylococcus species NOT DETECTED NOT DETECTED Final   Staphylococcus aureus (BCID) NOT DETECTED NOT DETECTED Final   Staphylococcus epidermidis NOT DETECTED NOT DETECTED Final   Staphylococcus lugdunensis NOT DETECTED NOT DETECTED Final   Streptococcus species DETECTED (A) NOT DETECTED Final    Comment: CRITICAL RESULT CALLED TO, READ BACK BY AND VERIFIED WITH: V BRYK,PHARMD@[id number]  09/07/21 [place]    Streptococcus agalactiae DETECTED (A) NOT DETECTED Final    Comment: CRITICAL RESULT CALLED TO, READ BACK BY AND  VERIFIED WITH: V BRYK,PHARMD@[id number]  09/07/21 [place]    Streptococcus pneumoniae NOT DETECTED NOT DETECTED Final   Streptococcus pyogenes NOT DETECTED NOT DETECTED Final   A.calcoaceticus-baumannii NOT DETECTED NOT DETECTED Final   Bacteroides fragilis NOT DETECTED NOT DETECTED Final   Enterobacterales NOT DETECTED NOT DETECTED Final   Enterobacter cloacae complex NOT DETECTED NOT DETECTED Final   Escherichia coli NOT DETECTED NOT DETECTED Final   Klebsiella aerogenes NOT DETECTED NOT DETECTED Final   Klebsiella oxytoca NOT DETECTED NOT DETECTED Final   Klebsiella pneumoniae NOT DETECTED NOT DETECTED Final   Proteus species NOT DETECTED NOT DETECTED Final   Salmonella species NOT DETECTED NOT DETECTED Final   Serratia marcescens NOT DETECTED NOT DETECTED Final   Haemophilus influenzae NOT DETECTED NOT DETECTED Final   Neisseria meningitidis NOT DETECTED NOT DETECTED Final   Pseudomonas aeruginosa NOT DETECTED NOT DETECTED Final   Stenotrophomonas maltophilia NOT DETECTED NOT DETECTED Final   Candida albicans NOT DETECTED NOT DETECTED Final   Candida auris NOT DETECTED NOT DETECTED Final   Candida glabrata NOT DETECTED NOT DETECTED Final   Candida krusei NOT DETECTED NOT DETECTED Final   Candida parapsilosis NOT DETECTED NOT DETECTED Final   Candida tropicalis NOT DETECTED NOT DETECTED Final   Cryptococcus neoformans/gattii NOT DETECTED NOT DETECTED Final    Comment: Performed at [facility], [place]. [address] [place], [place] [address]  Culture, blood (routine x 2)  Status: Abnormal   Collection Time: 09/06/21 12:46 PM   Specimen: BLOOD RIGHT HAND  Result Value Ref Range Status   Specimen Description BLOOD RIGHT HAND  Final   Special Requests   Final    BOTTLES DRAWN AEROBIC AND ANAEROBIC Blood Culture adequate volume   Culture  Setup Time   Final    GRAM POSITIVE COCCI IN CHAINS IN BOTH AEROBIC AND ANAEROBIC BOTTLES CRITICAL VALUE NOTED.  VALUE IS CONSISTENT WITH PREVIOUSLY  REPORTED AND CALLED VALUE.    Culture (A)  Final    STREPTOCOCCUS AGALACTIAE SUSCEPTIBILITIES PERFORMED ON PREVIOUS CULTURE WITHIN THE LAST 5 DAYS. Performed at [facility], [place] [address], [place], [place] [address]    Report Status 09/09/2021 FINAL  Final  Culture, blood (Routine X 2) w Reflex to ID Panel     Status: None (Preliminary result)   Collection Time: 09/08/21  5:30 AM   Specimen: BLOOD LEFT HAND  Result Value Ref Range Status   Specimen Description BLOOD LEFT HAND  Final   Special Requests AEROBIC BOTTLE ONLY Blood Culture adequate volume  Final   Culture   Final    NO GROWTH 1 DAY Performed at [facility], [place] [address] [place], [place] [address]    Report Status PENDING  Incomplete  Culture, blood (routine x 2)     Status: None (Preliminary result)   Collection Time: 09/08/21  5:31 AM   Specimen: BLOOD RIGHT HAND  Result Value Ref Range Status   Specimen Description BLOOD RIGHT HAND  Final   Special Requests AEROBIC BOTTLE ONLY Blood Culture adequate volume  Final   Culture   Final    NO GROWTH 1 DAY Performed at [facility], [place] [address], [place], [place] [address]    Report Status PENDING  Incomplete     RADIOLOGY STUDIES/RESULTS: DG Bone Survey Met  Result Date: 09/08/2021 CLINICAL DATA:  Left hip pain. EXAM: METASTATIC BONE SURVEY COMPARISON:  None. FINDINGS: No metastatic lesions identified. No cause for left hip pain identified. IMPRESSION: No acute abnormalities identified. No metastatic lesions. No cause for hip pain noted. Electronically Signed   By: Dorise Bullion III M.D.   On: 09/08/2021 14:32   ECHOCARDIOGRAM COMPLETE  Result Date: 09/08/2021    ECHOCARDIOGRAM REPORT   Patient Name:   PRIDE GONZALES Date of Exam: 09/08/2021 Medical Rec #:  [id number]    Height:       70.0 in Accession #:    [id number]   Weight:       156.5 lb Date of Birth:  11/23/1951     BSA:          1.881 m Patient Age:    82 years     BP:           130/78  mmHg Patient Gender: M            HR:           75 bpm. Exam Location:  Inpatient Procedure: 2D Echo, Cardiac Doppler and Color Doppler Indications:    Bacteremia R78.81  History:        Patient has prior history of Echocardiogram examinations, most                 recent 08/24/2021. Risk Factors:Current Smoker. Generalized                 weakness/shortness of breath-found to have AKI and severe  normocytic anemia.  Sonographer:    Alvino Chapel RCS Referring Phys: [id number]  1. Left ventricular ejection fraction, by estimation, is 45 to 50%. The left ventricle has mildly decreased function. The left ventricle demonstrates global hypokinesis. There is moderate concentric left ventricular hypertrophy. Left ventricular diastolic parameters are consistent with Grade II diastolic dysfunction (pseudonormalization).  2. Right ventricular systolic function is normal. The right ventricular size is normal. Tricuspid regurgitation signal is inadequate for assessing PA pressure.  3. Left atrial size was moderately dilated.  4. A small to moderate pericardial effusion is present. The pericardial effusion is anterior to the right ventricle. There is no evidence of cardiac tamponade.  5. The mitral valve is grossly normal. Mild mitral valve regurgitation.  6. The aortic valve is tricuspid. There is mild thickening of the aortic valve. Aortic valve regurgitation is not visualized.  7. The inferior vena cava is normal in size with greater than 50% respiratory variability, suggesting right atrial pressure of 3 mmHg.  8. No obvious valvular vegetations. Comparison(s): Prior images reviewed side by side. LVEF mildly reduced and pericardial effusion is new. FINDINGS  Left Ventricle: Left ventricular ejection fraction, by estimation, is 45 to 50%. The left ventricle has mildly decreased function. The left ventricle demonstrates global hypokinesis. The left ventricular internal cavity size was  normal in size. There is  moderate concentric left ventricular hypertrophy. Left ventricular diastolic parameters are consistent with Grade II diastolic dysfunction (pseudonormalization). Right Ventricle: The right ventricular size is normal. No increase in right ventricular wall thickness. Right ventricular systolic function is normal. Tricuspid regurgitation signal is inadequate for assessing PA pressure. Left Atrium: Left atrial size was moderately dilated. Right Atrium: Right atrial size was normal in size. Pericardium: A small pericardial effusion is present. The pericardial effusion is anterior to the right ventricle. There is no evidence of cardiac tamponade. Mitral Valve: The mitral valve is grossly normal. There is mild thickening of the mitral valve leaflet(s). Mild mitral annular calcification. Mild mitral valve regurgitation. Tricuspid Valve: The tricuspid valve is grossly normal. Tricuspid valve regurgitation is trivial. Aortic Valve: The aortic valve is tricuspid. There is mild thickening of the aortic valve. There is mild to moderate aortic valve annular calcification. Aortic valve regurgitation is not visualized. Pulmonic Valve: The pulmonic valve was grossly normal. Pulmonic valve regurgitation is trivial. Aorta: The aortic root is normal in size and structure. Venous: The inferior vena cava is normal in size with greater than 50% respiratory variability, suggesting right atrial pressure of 3 mmHg. IAS/Shunts: No atrial level shunt detected by color flow Doppler.  LEFT VENTRICLE PLAX 2D LVIDd:         5.10 cm  Diastology LVIDs:         3.90 cm  LV e' medial:    3.77 cm/s LV PW:         1.50 cm  LV E/e' medial:  22.9 LV IVS:        1.40 cm  LV e' lateral:   7.18 cm/s LVOT diam:     2.00 cm  LV E/e' lateral: 12.0 LV SV:         51 LV SV Index:   27 LVOT Area:     3.14 cm  RIGHT VENTRICLE RV S prime:     14.40 cm/s TAPSE (M-mode): 1.9 cm LEFT ATRIUM              Index       RIGHT ATRIUM  Index LA diam:        4.20 cm  2.23 cm/m  RA Area:     14.50 cm LA Vol (A2C):   104.0 ml 55.29 ml/m RA Volume:   36.00 ml  19.14 ml/m LA Vol (A4C):   64.4 ml  34.24 ml/m LA Biplane Vol: 83.7 ml  44.50 ml/m  AORTIC VALVE LVOT Vmax:   89.00 cm/s LVOT Vmean:  53.600 cm/s LVOT VTI:    0.161 m  AORTA Ao Root diam: 3.50 cm MITRAL VALVE MV Area (PHT): 3.65 cm    SHUNTS MV Decel Time: 208 msec    Systemic VTI:  0.16 m MV E velocity: 86.40 cm/s  Systemic Diam: 2.00 cm MV A velocity: 63.70 cm/s MV E/A ratio:  1.36 Rozann Lesches MD Electronically signed by Rozann Lesches MD Signature Date/Time: 09/08/2021/5:26:36 PM    Final      LOS: 17 days   Alma Friendly, MD  [facility]    09/09/2021, 2:42 PM

## 2021-09-10 ENCOUNTER — Inpatient Hospital Stay (HOSPITAL_COMMUNITY): Payer: Commercial Managed Care - PPO

## 2021-09-10 DIAGNOSIS — I5031 Acute diastolic (congestive) heart failure: Secondary | ICD-10-CM

## 2021-09-10 DIAGNOSIS — E854 Organ-limited amyloidosis: Secondary | ICD-10-CM | POA: Diagnosis not present

## 2021-09-10 DIAGNOSIS — N179 Acute kidney failure, unspecified: Secondary | ICD-10-CM | POA: Diagnosis not present

## 2021-09-10 DIAGNOSIS — N390 Urinary tract infection, site not specified: Secondary | ICD-10-CM | POA: Diagnosis not present

## 2021-09-10 DIAGNOSIS — I5021 Acute systolic (congestive) heart failure: Secondary | ICD-10-CM | POA: Diagnosis not present

## 2021-09-10 DIAGNOSIS — E8581 Light chain (AL) amyloidosis: Secondary | ICD-10-CM | POA: Diagnosis not present

## 2021-09-10 DIAGNOSIS — I43 Cardiomyopathy in diseases classified elsewhere: Secondary | ICD-10-CM

## 2021-09-10 DIAGNOSIS — E859 Amyloidosis, unspecified: Secondary | ICD-10-CM

## 2021-09-10 DIAGNOSIS — B951 Streptococcus, group B, as the cause of diseases classified elsewhere: Secondary | ICD-10-CM | POA: Diagnosis not present

## 2021-09-10 DIAGNOSIS — B9561 Methicillin susceptible Staphylococcus aureus infection as the cause of diseases classified elsewhere: Secondary | ICD-10-CM

## 2021-09-10 DIAGNOSIS — R7881 Bacteremia: Secondary | ICD-10-CM

## 2021-09-10 LAB — CBC WITH DIFFERENTIAL/PLATELET
Abs Immature Granulocytes: 0.07 10*3/uL (ref 0.00–0.07)
Basophils Absolute: 0.1 10*3/uL (ref 0.0–0.1)
Basophils Relative: 1 %
Eosinophils Absolute: 0.1 10*3/uL (ref 0.0–0.5)
Eosinophils Relative: 1 %
HCT: 29.1 % — ABNORMAL LOW (ref 39.0–52.0)
Hemoglobin: 9.3 g/dL — ABNORMAL LOW (ref 13.0–17.0)
Immature Granulocytes: 1 %
Lymphocytes Relative: 21 %
Lymphs Abs: 2.7 10*3/uL (ref 0.7–4.0)
MCH: 27 pg (ref 26.0–34.0)
MCHC: 32 g/dL (ref 30.0–36.0)
MCV: 84.3 fL (ref 80.0–100.0)
Monocytes Absolute: 0.7 10*3/uL (ref 0.1–1.0)
Monocytes Relative: 5 %
Neutro Abs: 9.4 10*3/uL — ABNORMAL HIGH (ref 1.7–7.7)
Neutrophils Relative %: 71 %
Platelets: 409 10*3/uL — ABNORMAL HIGH (ref 150–400)
RBC: 3.45 MIL/uL — ABNORMAL LOW (ref 4.22–5.81)
RDW: 17.2 % — ABNORMAL HIGH (ref 11.5–15.5)
WBC: 13.1 10*3/uL — ABNORMAL HIGH (ref 4.0–10.5)
nRBC: 0 % (ref 0.0–0.2)

## 2021-09-10 LAB — RENAL FUNCTION PANEL
Albumin: 1.6 g/dL — ABNORMAL LOW (ref 3.5–5.0)
Anion gap: 15 (ref 5–15)
BUN: 41 mg/dL — ABNORMAL HIGH (ref 8–23)
CO2: 21 mmol/L — ABNORMAL LOW (ref 22–32)
Calcium: 7.8 mg/dL — ABNORMAL LOW (ref 8.9–10.3)
Chloride: 98 mmol/L (ref 98–111)
Creatinine, Ser: 5.04 mg/dL — ABNORMAL HIGH (ref 0.61–1.24)
GFR, Estimated: 12 mL/min — ABNORMAL LOW (ref >60)
Glucose, Bld: 118 mg/dL — ABNORMAL HIGH (ref 70–99)
Phosphorus: 2.2 mg/dL — ABNORMAL LOW (ref 2.5–4.6)
Potassium: 4.1 mmol/L (ref 3.5–5.1)
Sodium: 134 mmol/L — ABNORMAL LOW (ref 135–145)

## 2021-09-10 LAB — SURGICAL PATHOLOGY

## 2021-09-10 LAB — TROPONIN I (HIGH SENSITIVITY): Troponin I (High Sensitivity): 675 ng/L (ref <18)

## 2021-09-10 IMAGING — MR MR CARDIA MORPHOLOGY W/O CM
45 of 48 series · 45 of 48 positions shown · IV contrast (agent unspecified)
Comparison: none

CLINICAL DATA: Concern for cardiac amyloidosis.

EXAM:
CARDIAC MRI
TECHNIQUE: The patient was scanned on a 1.5 Tesla GE magnet. A dedicated
cardiac coil was used. Functional imaging was done using Fiesta
sequences. [DATE], and 4 chamber views were done to assess for RWMA's.
Modified STEVAN rule using a short axis stack was used to
calculate an ejection fraction on a dedicated work station using
Circle software. T1 and T2 sequences done. No contrast due to STEVAN
and unstable renal function.
CONTRAST:  None

[Series 5: t2_haste_db_tra_bh · axial · 8.0mm · 1.41mm/px · 1 of 19 slices shown]
[im 1/19]
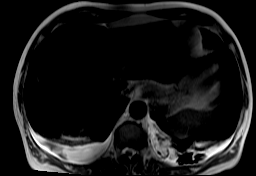

[Series 9: bSSFP · oblique · 8.0mm · 1.61mm/px · 1 of 17 slices shown (1 of 20)]
[im 1/17]
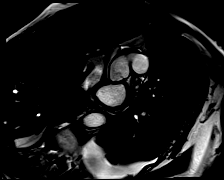

[Series 10: bSSFP · oblique · 8.0mm · 1.61mm/px · 1 of 17 slices shown (2 of 20)]
[im 1/17]
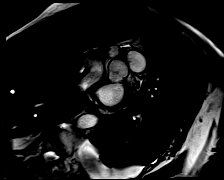

[Series 11: bSSFP · oblique · 8.0mm · 1.61mm/px · 1 of 17 slices shown (3 of 20)]
[im 1/17]
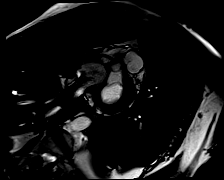

[Series 12: bSSFP · oblique · 8.0mm · 1.61mm/px · 1 of 17 slices shown (4 of 20)]
[im 1/17]
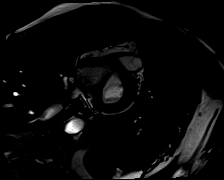

[Series 13: bSSFP · oblique · 8.0mm · 1.61mm/px · 1 of 17 slices shown (5 of 20)]
[im 1/17]
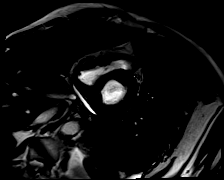

[Series 14: bSSFP · oblique · 8.0mm · 1.61mm/px · 1 of 17 slices shown (6 of 20)]
[im 1/17]
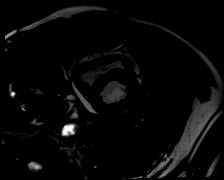

[Series 15: bSSFP · oblique · 8.0mm · 1.61mm/px · 1 of 17 slices shown (7 of 20)]
[im 1/17]
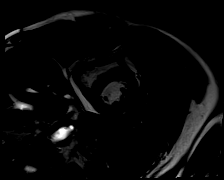

[Series 16: bSSFP · oblique · 8.0mm · 1.61mm/px · 1 of 17 slices shown (8 of 20)]
[im 1/17]
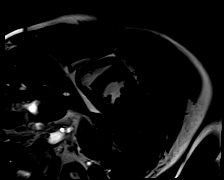

[Series 17: bSSFP · oblique · 8.0mm · 1.61mm/px · 1 of 17 slices shown (9 of 20)]
[im 1/17]
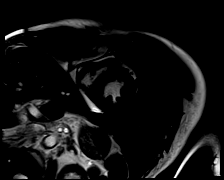

[Series 18: bSSFP · oblique · 8.0mm · 1.61mm/px · 1 of 17 slices shown (10 of 20)]
[im 1/17]
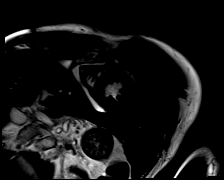

[Series 19: bSSFP · oblique · 8.0mm · 1.61mm/px · 1 of 17 slices shown (11 of 20)]
[im 1/17]
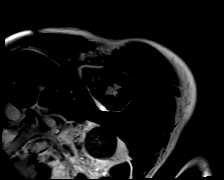

[Series 20: bSSFP · oblique · 8.0mm · 1.61mm/px · 1 of 17 slices shown (12 of 20)]
[im 1/17]
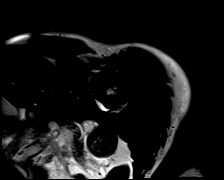

[Series 21: bSSFP · oblique · 8.0mm · 1.61mm/px · 1 of 17 slices shown (13 of 20)]
[im 1/17]
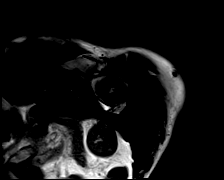

[Series 22: bSSFP · oblique · 8.0mm · 1.61mm/px · 1 of 17 slices shown (14 of 20)]
[im 1/17]
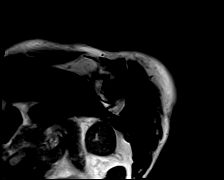

[Series 23: bSSFP · oblique · 8.0mm · 1.61mm/px · 1 of 17 slices shown (15 of 20)]
[im 1/17]
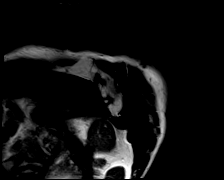

[Series 24: bSSFP · oblique · 8.0mm · 1.61mm/px · 1 of 17 slices shown (16 of 20)]
[im 1/17]
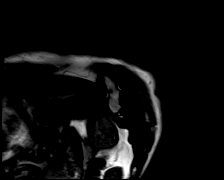

[Series 25: (id)_long_t1 · oblique · 8.0mm · 1.56mm/px · 1 of 24 slices shown]
[im 1/24]
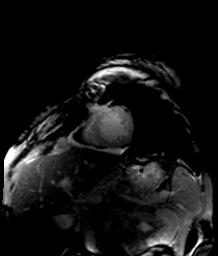

[Series 26: (id)_long_t1_moco · oblique · 8.0mm · 1.56mm/px · 1 of 24 slices shown]
[im 1/24]
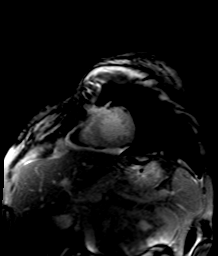

[Series 27: (id)_long_t1_moco_t1 · oblique · 8.0mm · 1.56mm/px · 1 of 6 slices shown]
[im 1/6]
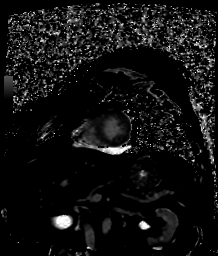

[Series 29: STIR · oblique · 8.0mm · 1.92mm/px · 1 of 15 slices shown]
[im 1/15]
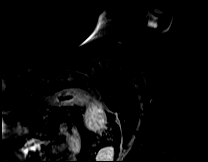

[Series 30: (id)_trufi · oblique · 8.0mm · 2.08mm/px · 1 of 9 slices shown]
[im 1/9]
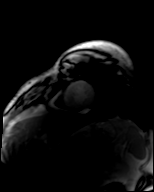

[Series 31: (id)_trufi_moco · oblique · 8.0mm · 2.08mm/px · 1 of 9 slices shown]
[im 1/9]
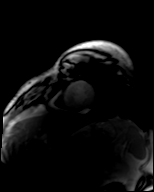

[Series 34: bSSFP · sagittal · 6.0mm · 1.41mm/px · 1 of 14 slices shown (17 of 20)]
[im 1/14]
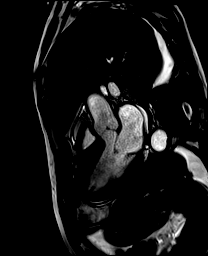

[Series 35: bSSFP · sagittal · 6.0mm · 1.41mm/px · 1 of 16 slices shown (18 of 20)]
[im 1/16]
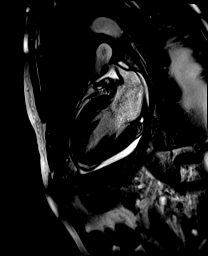

[Series 36: bSSFP · axial · 6.0mm · 1.41mm/px · 1 of 16 slices shown (19 of 20)]
[im 1/16]
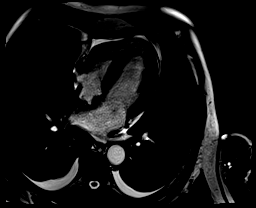

[Series 37: bSSFP · coronal · 6.0mm · 1.41mm/px · 1 of 15 slices shown (20 of 20)]
[im 1/15]
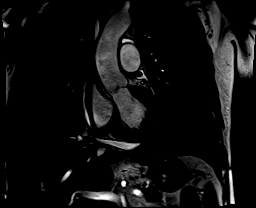

[Series 38: aortic valve cine · axial · 6.0mm · 1.41mm/px · 1 of 17 slices shown (1 of 6)]
[im 1/17]
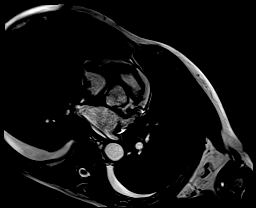

[Series 39: cine rvit · oblique · 6.0mm · 1.41mm/px · 1 of 17 slices shown]
[im 1/17]
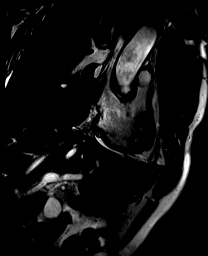

[Series 40: cine rvot · sagittal · 6.0mm · 1.41mm/px · 1 of 16 slices shown (1 of 2)]
[im 1/16]
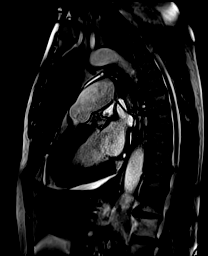

[Series 41: aortic valve cine · axial · 5.0mm · 1.41mm/px · 1 of 17 slices shown (2 of 6)]
[im 1/17]
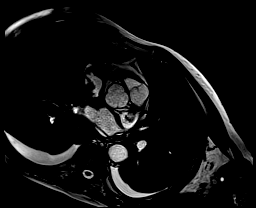

[Series 42: aortic valve cine · axial · 5.0mm · 1.41mm/px · 1 of 17 slices shown (3 of 6)]
[im 1/17]
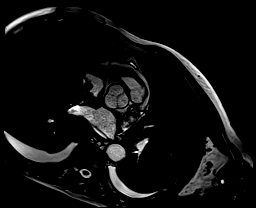

[Series 43: aortic valve cine · axial · 5.0mm · 1.41mm/px · 1 of 17 slices shown (4 of 6)]
[im 1/17]
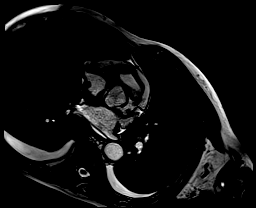

[Series 44: aortic valve cine · axial · 5.0mm · 1.41mm/px · 1 of 17 slices shown (5 of 6)]
[im 1/17]
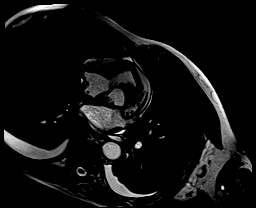

[Series 45: aortic valve cine · axial · 5.0mm · 1.41mm/px · 1 of 17 slices shown (6 of 6)]
[im 1/17]
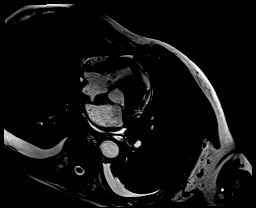

[Series 46: cine rvot · sagittal · 6.0mm · 1.41mm/px · 1 of 16 slices shown (2 of 2)]
[im 1/16]
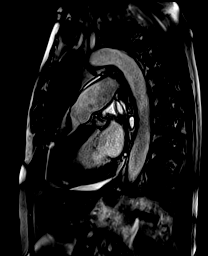

[Series 47: aortic valve flow_200_tp_retro_bh · axial · 6.0mm · 1.73mm/px · 1 of 19 slices shown]
[im 1/19]
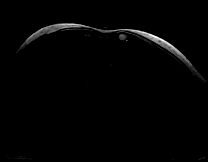

[Series 48: aortic valve flow_200_tp_retro_bh_mag · axial · 6.0mm · 1.73mm/px · 1 of 15 slices shown]
[im 1/15]
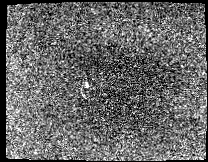

[Series 49: aortic valve flow_200_tp_retro_bh_p · axial · 6.0mm · 1.73mm/px · 1 of 19 slices shown]
[im 1/19]
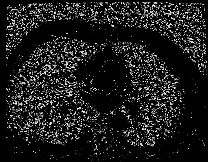

[Series 50: cine_trufi_cs_rt_short axis · sagittal · 6.0mm · 1.73mm/px · 1 of 48 slices shown (1 of 6)]
[im 1/48]
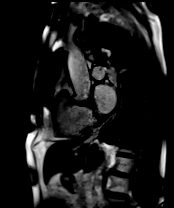

[Series 50: cine_trufi_cs_rt_short axis · sagittal · 6.0mm · 1.73mm/px · 1 of 48 slices shown (2 of 6)]
[im 1/48]
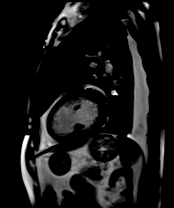

[Series 50: cine_trufi_cs_rt_short axis · sagittal · 6.0mm · 1.73mm/px · 1 of 48 slices shown (3 of 6)]
[im 1/48]
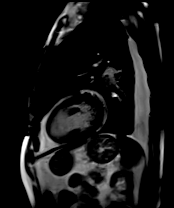

[Series 50: cine_trufi_cs_rt_short axis · sagittal · 6.0mm · 1.73mm/px · 1 of 48 slices shown (4 of 6)]
[im 1/48]
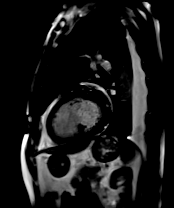

[Series 50: cine_trufi_cs_rt_short axis · sagittal · 6.0mm · 1.73mm/px · 1 of 48 slices shown (5 of 6)]
[im 1/48]
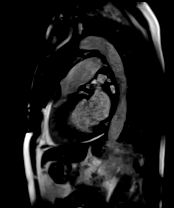

[Series 50: cine_trufi_cs_rt_short axis · sagittal · 6.0mm · 1.73mm/px · 1 of 48 slices shown (6 of 6)]
[im 1/48]
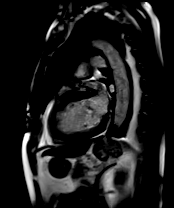

[45 of 48 positions shown; findings below may reference images not displayed]

FINDINGS: Limited images of the lung fields showed small bilateral pleural
effusions.

Small inferior pericardial effusion. Normal left ventricular size
with moderate LV hypertrophy. Moderate diffuse hypokinesis with EF
38%. Normal right ventricular size with mild-moderate dysfunction,
EF 36%. The aortic valve is trileaflet with no significant stenosis
or regurgitation. Normal right atrial size. Mild left atrial
enlargement. No significant mitral regurgitation.

Measurements:

T2 53

LVEDV 134 mL

LVSV 51 mL
LVEF 38%

RVEDV 98 mL
RVSV 35 mL
RVEF 36%
IMPRESSION: 1. Normal LV size with moderate LV hypertrophy. EF 38% with diffuse
hypokinesis.

2.  Normal RV size with EF 36%.

3.  T1 elevated, T2 borderline elevated.

4.  Small inferior pericardial effusion.

This study could be consistent with cardiac amyloidosis, showing
moderate LVH with small effusion. T1 readings are elevated which is
suggestive of cardiac amyloidosis as well.

STEVAN

## 2021-09-10 MED ORDER — ONDANSETRON HCL 4 MG PO TABS: 4.0000 mg | ORAL_TABLET | Freq: Four times a day (QID) | ORAL | Status: DC | PRN

## 2021-09-10 MED ORDER — CYCLOPHOSPHAMIDE 25 MG PO CAPS
425.0000 mg | ORAL_CAPSULE | Freq: Once | ORAL | Status: AC
  Administered 2021-09-10: 425 mg via ORAL
  Filled 2021-09-10: qty 17

## 2021-09-10 MED ORDER — ACYCLOVIR 200 MG PO CAPS
200.0000 mg | ORAL_CAPSULE | Freq: Two times a day (BID) | ORAL | Status: DC
  Administered 2021-09-10 – 2021-09-24 (×29): 200 mg via ORAL
  Filled 2021-09-10 (×30): qty 1

## 2021-09-10 MED ORDER — CYCLOPHOSPHAMIDE 25 MG PO CAPS: 425.0000 mg | ORAL_CAPSULE | Freq: Every day | ORAL | Status: DC

## 2021-09-10 MED ORDER — BORTEZOMIB CHEMO SQ INJECTION 3.5 MG (2.5MG/ML)
1.5000 mg/m2 | Freq: Once | INTRAMUSCULAR | Status: AC
  Administered 2021-09-10: 2.75 mg via SUBCUTANEOUS
  Filled 2021-09-10: qty 1.1

## 2021-09-10 NOTE — Progress Notes (Signed)
PT Cancellation Note  Patient Details Name: Caleb Simmons MRN: [id number] DOB: Jan 09, 1951   Cancelled Treatment:    Reason Eval/Treat Not Completed: (P) Patient at procedure or test/unavailable Pt is off floor for MRI and is receiving chemo 12-1. PT will follow back for treatment as able.  Jaquila Santelli B. Migdalia Dk PT, DPT Acute Rehabilitation Services Pager [phone] Office [phone]    [place] 09/10/2021, 11:59 AM

## 2021-09-10 NOTE — Consult Note (Addendum)
Advanced Heart Failure Team Consult Note   Primary Physician: Pcp, No PCP-Cardiologist:  None  Reason for Consultation: Acute Systolic Heart Failure ? Cardiac Amyloidosis   HPI:    Caleb Simmons is seen today for evaluation of acute systolic heart failure/ ? Cardiac amyloidosis at the request of Dr. Horris Latino, Internal Medicine.   70 y/o male w/ h/o tobacco use and nephrolithiasis but no other significant PMH. His father died suddenly at age 21 while driving, presumed to be from MI, but no other family h/o cardiac issues to his knowledge.   He presented to the ED on 9/1 given multiple symptoms over the last month, mainly unexplained new weakness and fatigue x 4 weeks. Also development of chest congestion + wheezing and productive cough but denies any significant dyspnea. Initially went to urgent care and was treated for bronchitis. He continued to get worse and developed dizziness and near syncope while at work and came to the ED. He was found to have an AKI w/ SCr of 6.56. BUN 44 K 4.4. CT of A/P negative for nephrolithiasis. UA + for proteinuria and hematuria. Serology evaluation including ANA, ANCA, C3, C4, SPEP, anti-GBM, double-stranded DNA, hep B, hep C, HIV negative. Nephrology consulted. Underwent renal biopsy and bone marrow biopsy and diagnosed w/ AL amyloidosis and plasma cell myeloma. Post biopsy, developed bleeding into the renal collecting system-with clot retention requiring insertion of three-way catheter and CBI. IR performed embolization. Required blood transfusion. He has also required 1 session of HD this admission. Also being treated for 2/2 Grp B strep bacteremia/ Grp B strep UTI . TTE with no known vegetation. S/p Cefepime, Vancomycin---> IV Penicillin. ID to see in consultation today.    Hem/Onc now following for AL amyloid and planning to start chemo Velcade injection and oral chemo cytoxan.  AHF team consulted to assess for potential cardiac involvement. Echo shows mildly  reduced LVEF 45-50%, moderate concentric LVH, G2DD. RV normal. + small- mod pericardial effusion. EKG NSR w/ normal voltage. BNP 2,567. Most recent CXR 9/15 showed improved interstitial edema with mild pulmonary venous congestion remaining. Small bilateral pleural effusions with adjacent atelectasis. He denies dyspnea.    Echo 09/08/21 Left ventricular ejection fraction, by estimation, is 45 to 50%. The left ventricle has mildly decreased function. The left ventricle demonstrates global hypokinesis. There is moderate concentric left ventricular hypertrophy. Left ventricular diastolic parameters are consistent with Grade II diastolic dysfunction (pseudonormalization). 1. Right ventricular systolic function is normal. The right ventricular size is normal. Tricuspid regurgitation signal is inadequate for assessing PA pressure. 2. 3. Left atrial size was moderately dilated. A small to moderate pericardial effusion is present. The pericardial effusion is anterior to the right ventricle. There is no evidence of cardiac tamponade. 4. 5. The mitral valve is grossly normal. Mild mitral valve regurgitation. The aortic valve is tricuspid. There is mild thickening of the aortic valve. Aortic valve regurgitation is not visualized. 6. The inferior vena cava is normal in size with greater than 50% respiratory variability, suggesting right atrial pressure of 3 mmHg. 7. 8. No obvious valvular vegetations.  Review of Systems: [y] = yes, [ ]  = no   General: Weight gain [ ] ; Weight loss [ ] ; Anorexia [ ] ; Fatigue [ Y]; Fever [ ] ; Chills [ ] ; Weakness [Y ]  Cardiac: Chest pain/pressure [ ] ; Resting SOB [ ] ; Exertional SOB [ ] ; Orthopnea [ ] ; Pedal Edema [ ] ; Palpitations [ ] ; Syncope [ ] ; Presyncope [Y ]; Paroxysmal  nocturnal dyspnea[ ]   Pulmonary: Cough [ Y]; Wheezing[Y ]; Hemoptysis[ ] ; Sputum [ ] ; Snoring [ ]   GI: Vomiting[ ] ; Dysphagia[ ] ; Melena[ ] ; Hematochezia [ ] ; Heartburn[ ] ; Abdominal pain [ ] ;  Constipation [ ] ; Diarrhea [ ] ; BRBPR [ ]   GU: Hematuria[ ] ; Dysuria [ ] ; Nocturia[ ]   Vascular: Pain in legs with walking [ ] ; Pain in feet with lying flat [ ] ; Non-healing sores [ ] ; Stroke [ ] ; TIA [ ] ; Slurred speech [ ] ;  Neuro: Headaches[ ] ; Vertigo[ ] ; Seizures[ ] ; Paresthesias[ ] ;Blurred vision [ ] ; Diplopia [ ] ; Vision changes [ ]   Ortho/Skin: Arthritis [ ] ; Joint pain [ ] ; Muscle pain [ ] ; Joint swelling [ ] ; Back Pain [ Y]; Rash [ ]   Psych: Depression[ ] ; Anxiety[ ]   Heme: Bleeding problems [ Y]; Clotting disorders [ ] ; Anemia [Y ]  Endocrine: Diabetes [ ] ; Thyroid dysfunction[ ]   Home Medications Prior to Admission medications   Medication Sig Start Date End Date Taking? Authorizing Provider  amoxicillin-clavulanate (AUGMENTIN) 875-125 MG tablet Take 1 tablet by mouth 2 (two) times daily. 08/14/21  Yes [provider]  Aspirin-Salicylamide-Caffeine (BC FAST PAIN RELIEF) 650-195-33.3 MG PACK Take 1 Package by mouth daily as needed (pain).   Yes [provider]  promethazine-dextromethorphan (PROMETHAZINE-DM) 6.25-15 MG/5ML syrup Take 5 mLs by mouth every 4 (four) hours as needed for cough. 08/14/21  Yes [provider]    Past Medical History: History reviewed. No pertinent past medical history.  Past Surgical History: Past Surgical History:  Procedure Laterality Date   APPENDECTOMY     IR EMBO ART  VEN HEMORR LYMPH EXTRAV  INC GUIDE ROADMAPPING  08/30/2021   IR FLUORO GUIDE CV LINE RIGHT  08/30/2021   IR RENAL SELECTIVE  UNI INC S&I MOD SED  08/31/2021   IR US GUIDE VASC ACCESS RIGHT  08/30/2021   IR US GUIDE VASC ACCESS RIGHT  08/30/2021    Family History: History reviewed. No pertinent family history.  Social History: Social History   Socioeconomic History   Marital status: Married    Spouse name: Not on file   Number of children: Not on file   Years of education: Not on file   Highest education level: Not on file  Occupational History   Not on  file  Tobacco Use   Smoking status: Some Days    Packs/day: 0.25    Years: 25.00    Pack years: 6.25    Types: Cigarettes   Smokeless tobacco: Never  Substance and Sexual Activity   Alcohol use: Not on file   Drug use: Not on file   Sexual activity: Not on file  Other Topics Concern   Not on file  Social History Narrative   Not on file   Social Determinants of Health   Financial Resource Strain: Not on file  Food Insecurity: Not on file  Transportation Needs: Not on file  Physical Activity: Not on file  Stress: Not on file  Social Connections: Not on file    Allergies:  No Known Allergies  Objective:    Vital Signs:   Temp:  [97.6 F (36.4 C)-98 F (36.7 C)] 98 F (36.7 C) (09/19 0959) Pulse Rate:  [62-67] 65 (09/19 0959) Resp:  [16-20] 20 (09/19 0959) BP: (131-133)/(72-84) 131/83 (09/19 0959) SpO2:  [95 %-98 %] 97 % (09/19 0959) Last BM Date: 09/08/21  Weight change: Filed Weights   08/23/21 0816 09/07/21 1431 09/07/21 1615  Weight: 81.6 kg  71.7 kg 71 kg    Intake/Output:   Intake/Output Summary (Last 24 hours) at 09/10/2021 1057 Last data filed at 09/10/2021 1000 Gross per 24 hour  Intake 500 ml  Output 2800 ml  Net -2300 ml      Physical Exam    General:  Well appearing. No resp difficulty HEENT: normal Neck: supple. JVP .  + THD cath Rt IJ Carotids 2+ bilat; no bruits. No lymphadenopathy or thyromegaly appreciated. Cor: PMI nondisplaced. Regular rate & rhythm. No rubs, gallops or murmurs. Lungs: clear Abdomen: soft, nontender, nondistended. No hepatosplenomegaly. No bruits or masses. Good bowel sounds. Extremities: no cyanosis, clubbing, rash, edema Neuro: alert & orientedx3, cranial nerves grossly intact. moves all 4 extremities w/o difficulty. Affect pleasant   Telemetry   NSR 70s.   EKG    NSR 90 bpm, normal voltage   Labs   Basic Metabolic Panel: Recent Labs  Lab 09/06/21 0317 09/07/21 0125 09/08/21 0530 09/09/21 0336  09/10/21 0229  NA 138 134* 133* 130* 134*  K 4.0 3.9 3.3* 4.1 4.1  CL 102 98 98 95* 98  CO2 25 23 23  20* 21*  GLUCOSE 95 89 105* 226* 118*  BUN 33* 36* 32* 40* 41*  CREATININE 5.97* 6.39* 5.52* 5.43* 5.04*  CALCIUM 8.1* 8.0* 7.8* 7.6* 7.8*  MG  --   --  1.7  --   --   PHOS 4.5 5.6* 3.9 2.3* 2.2*    Liver Function Tests: Recent Labs  Lab 09/06/21 0317 09/07/21 0125 09/08/21 0530 09/09/21 0336 09/10/21 0229  ALBUMIN 1.8* 1.7* 1.6* 1.7* 1.6*   No results for input(s): LIPASE, AMYLASE in the last 168 hours. No results for input(s): AMMONIA in the last 168 hours.  CBC: Recent Labs  Lab 09/06/21 0317 09/07/21 0125 09/08/21 0530 09/09/21 0336 09/10/21 0229  WBC 9.0 10.9* 7.5 12.7* 13.1*  NEUTROABS  --   --   --   --  9.4*  HGB 9.6* 9.6* 9.3* 9.3* 9.3*  HCT 30.7* 30.6* 29.5* 27.9* 29.1*  MCV 85.0 85.2 85.0 81.8 84.3  PLT 380 370 380 389 409*    Cardiac Enzymes: No results for input(s): CKTOTAL, CKMB, CKMBINDEX, TROPONINI in the last 168 hours.  BNP: BNP (last 3 results) Recent Labs    08/23/21 1901 09/09/21 1514  BNP 1,300.0* 2,567.7*    ProBNP (last 3 results) No results for input(s): PROBNP in the last 8760 hours.   CBG: No results for input(s): GLUCAP in the last 168 hours.  Coagulation Studies: No results for input(s): LABPROT, INR in the last 72 hours.   Imaging   No results found.   Medications:     Current Medications:  acyclovir  200 mg Oral Q12H   Chlorhexidine Gluconate Cloth  6 each Topical Daily   cyclophosphamide  400 mg Oral Daily   feeding supplement (NEPRO CARB STEADY)  237 mL Oral TID BM   folic acid  2 mg Oral Daily   Gerhardt's butt cream   Topical BID   melatonin  5 mg Oral QHS   nicotine  14 mg Transdermal Q24H   pantoprazole  40 mg Oral BID AC   sodium bicarbonate  1,300 mg Oral TID   tamsulosin  0.4 mg Oral Daily    Infusions:  sodium chloride     sodium chloride 10 mL/hr at 08/27/21 2147   pencillin G potassium  IV 4 Million Units (09/10/21 0404)   sodium chloride irrigation  Assessment/Plan   AL Amyloidosis/ Plasma Cell Myeloma - confirmed by renal biopsy and bone marrow biopsy. Congo red stain pending - Hem/Onc starting chemo Velcade injection and oral chemo cytoxan - plan cMRI to assess for potential cardiac involvement   2. Acute Systolic Heart Failure/ ? Cardiac Amyloid  - Newly diagnosed AL Amyloid per above - Echo mildly reduced LVEF 45-50%, mod LVH, G2DD, RV ok. Mild-mod pericardial effusion. Voltage normal on EKG - Will need cMRI. Plan today w/o contrast - further medical recs pending cMRI findings, though GDMT may be limited by AKI   - grossly euvolemic on exam. Hold diuretics for now.  May need diuretic at time of d/c   3. Grp B strep bacteremia/ Grp B strep UTI - penicillin per IM - ID to see today  - no vegetation seen on TTE but may need TEE. Will defer to ID   4. AKI:  - Myeloma Kidney, SCr 6.5 on admit. Down to 5.0 today  - Required HD session this admit  - Nonoliguric  - Management per nephrology   5. Anemia - 2/2 anemia of chronic disease + ABLA (bleeding post biopsy, s/p embolization) - s/p transfusion - hgb stable 9.3 today. Management per primary team      Length of Stay: [address] PA-C  09/10/2021, 10:57 AM  Advanced Heart Failure Team Pager [phone] (M-F; 7a - 5p)  Please contact [facility] for night-coverage after hours (4p -7a ) and weekends on [email or web address]  Patient seen with PA, agree with the above note.   We were asked to see the patient due to possible cardiac involvement by AL amyloidosis.  Recent history as outlined above.  Today, patient is symptomatically stable.  Not short of breath. He has had only 1 HD treatment (9/16).   General: NAD Neck: No JVD, no thyromegaly or thyroid nodule.  Lungs: Clear to auscultation bilaterally with normal respiratory effort. CV: Nondisplaced PMI.  Heart regular S1/S2, no S3/S4, no  murmur.  No peripheral edema.  No carotid bruit.  Normal pedal pulses.  Abdomen: Soft, nontender, no hepatosplenomegaly, no distention.  Skin: Intact without lesions or rashes.  Neurologic: Alert and oriented x 3.  Psych: Normal affect. Extremities: No clubbing or cyanosis.  HEENT: Normal.   1. 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  Patient presented with weakness/fatigue and was found to have AKI.  - Plan for chemotherapy with Cytoxan, Velcade, dexamethasone, daratumumab.  To start today.  - Suspect not candidate for autologous hematopoeitic cell transplant with age and renal failure.  2. Possible cardiac involvement by AL amyloidosis: Echo was reviewed, there is moderate LVH with global hypokinesis EF 45-50%, small pericardial effusion primarily adjacent to the RV, normal IVC.  Echo could be consistent with AL cardiac amyloidosis.  ECG does not show low voltage.  BNP has been elevated and HS-TnI was mildly elevated at admission with no trend, these findings are also common with cardiac amyloidosis though in this patient's case could be due to AKI and volume overload.  AL cardiac amyloidosis generally has a poor prognosis with rapid progression unless treated.   - I will arrange for cardiac MRI.  Will do without contrast given AKI requiring 1 HD session/renal instability. Hopefully, we can get some valuable information from parametrics (T1) that could point towards AL amyloidosis if present.  - Diagnosis of AL cardiac amyloidosis at  this point will likely make the most difference in terms of prognosis.  Treatment (chemotherapy) will be the same as already planned.  3. Acute diastolic CHF: In setting of suspected AL cardiac amyloidosis as well as AKI.  Volume status currently looks ok.  Will continue to follow without diuretics.  4.  AKI: History of excessive NSAIDs as well  as AL amyloidosis renal involvement.  Creatinine appears to have plateaued.  He had 1 session of HD on 9/16.  Renal following to determine future need.  There is hope for recovery.  5.  Group B strep bacteremia: With associated Group B strep UTI.  Currently afebrile, on PCN.  TTE did not show vegetation.  - ID to see patient today, suspect he may need TEE but will await ID input.   Loralie Champagne 09/10/2021 12:17 PM

## 2021-09-10 NOTE — Progress Notes (Signed)
No additional Dexamethasone today per MD.  Acquanetta Belling, RPH, BCPS, BCOP 09/10/2021 1:20 PM

## 2021-09-10 NOTE — Progress Notes (Signed)
PROGRESS NOTE        PATIENT DETAILS Name: Caleb Simmons Age: 70 y.o. Sex: male Date of Birth: [birth year] Admit Date: 08/23/2021   Brief Narrative: Patient is a 70 y.o. male with history of nephrolithiasis-presenting with generalized weakness/shortness of breath-found to have AKI and severe normocytic anemia.  Patient was evaluated by nephrology, subsequently underwent right renal biopsy on 9/6 had postrenal biopsy bleeding into the renal collecting system-with clot retention requiring insertion of three-way catheter and CBI. Patient continued to have renal bleeding-on 9/8-IR performed embolization. On 9/10, received 2 units PRBC for hemoglobin of 6.6 and hematuria. On 9/12, renal biopsy showed amyloidosis. Oncology consulted for further management.   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 Now requiring HD prn Currently nonoliguric, creatinine level stable around 5 Renal biopsy showed AL amyloidosis Nephrology on board, started HD treatment on 09/07/21 via R IJ temp cath Oncology input greatly appreciated, bone marrow biopsy done on 09/05/2021 showed plasma cell myeloma, plan to initiate treatment on 09/10/21 Daily renal panel  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 Small to moderate pericardial effusion Appears euvolemic Repeat ECHO to evaluate for  vegetations due to bacteremia showed no vegetations but drop in EF to 45-50%, Grade 2DD, small to mod pericardial effusion HF team consulted, plan for work up to r/o cardiac amyloidosis, cardiac MRI pending No need for diuretics as vol status is stable  Sepsis 2/2 Grp B strep bacteremia Grp B strep UTI Noted to spike temp 100.8 with chills, tachycardic, leukocytosis BC X 2 Grp B strep, repeat NGTD Procalcitonin 0.76 LA WNL UA with significant hematuria, many bacteria, >50 WBC, UC with grp B strep CXR unremarkable for infection TTE with no known vegetation S/p Cefepime, Vancomycin---> IV Penicillin  ID consulted, appreciate recs   Hematuria-acute urinary retention (clot retention) due to bleeding into the collecting system of the right kidney post renal biopsy on 9/6-requiring three-way Foley catheter insertion/CBI and embolization by IR on 9/8 Normocytic anemia/acute blood loss anemia Management per urology, Foley catheter discontinued 9/8, he does remain with some mild hematuria He did require multiple transfusions Frequent CBC  Anxiety Continue Xanax while he is in the hospital-is aware that we will not continue Xanax post discharge.  Use trazodone for sleep.  Patient is not interested in starting long-acting medications like SSRI for his anxiety-thinks that once he gets home-he will not require any further medications for his anxiety issues.  Tobacco abuse Continue transdermal nicotine.     Diet: Diet Order             Diet regular Room service appropriate? Yes; Fluid consistency: Thin  Diet effective now           Diet - low sodium heart healthy                    Code Status: Full code  Family Communication: None at bedside  DVT prophylaxis: SCDs  Disposition Plan: Status is: Inpatient  Remains inpatient appropriate because:Inpatient level of care appropriate due to severity of illness  Dispo: The patient is from: Home              Anticipated d/c is  to: Home              Patient currently is not medically stable to d/c.   Difficult to place patient No    Antimicrobial agents: Anti-infectives (From admission, onward)    Start     Dose/Rate Route Frequency Ordered Stop   09/10/21 1130  acyclovir (ZOVIRAX) 200 MG capsule 200 mg        200 mg Oral Every 12 hours 09/10/21 1042     09/07/21 1805  penicillin G potassium 4 Million Units in dextrose 5 % 250 mL IVPB        4 Million Units 250 mL/hr over 60 Minutes Intravenous Every 8 hours 09/07/21 1805     09/07/21 1400  penicillin G potassium 4 Million Units in dextrose 5 % 250 mL IVPB  Status:  Discontinued        4 Million Units 250 mL/hr over 60 Minutes Intravenous Every 8 hours 09/07/21 0827 09/07/21 1805   09/06/21 1400  ceFEPIme (MAXIPIME) 1 g in sodium chloride 0.9 % 100 mL IVPB  Status:  Discontinued        1 g 200 mL/hr over 30 Minutes Intravenous Every 24 hours 09/06/21 1307 09/07/21 0827   09/06/21 1400  vancomycin (VANCOREADY) IVPB 1750 mg/350 mL        1,750 mg 175 mL/hr over 120 Minutes Intravenous  Once 09/06/21 1310 09/06/21 2256        MEDICATIONS: Scheduled Meds:  acyclovir  200 mg Oral Q12H   bortezomib SQ  1.5 mg/m2 (Treatment Plan Recorded) Subcutaneous Once   Chlorhexidine Gluconate Cloth  6 each Topical Daily   cyclophosphamide  425 mg Oral Daily   feeding supplement (NEPRO CARB STEADY)  237 mL Oral TID BM   folic acid  2 mg Oral Daily   Gerhardt's butt cream   Topical BID   melatonin  5 mg Oral QHS   nicotine  14 mg Transdermal Q24H   pantoprazole  40 mg Oral BID AC   sodium bicarbonate  1,300 mg Oral TID   tamsulosin  0.4 mg Oral Daily   Continuous Infusions:  sodium chloride     sodium chloride 10 mL/hr at 08/27/21 2147   pencillin G potassium IV 4 Million Units (09/10/21 1102)   sodium chloride irrigation     PRN Meds:.acetaminophen, ALPRAZolam, bisacodyl, diphenhydrAMINE, lidocaine, ondansetron **OR** ondansetron (ZOFRAN) IV, oxyCODONE,  promethazine, technetium albumin aggregated, traMADol, traZODone   PHYSICAL EXAM: Vital signs: Vitals:   09/09/21 1357 09/09/21 2052 09/10/21 0524 09/10/21 0959  BP: 133/77 133/72 131/84 131/83  Pulse: 67 66 62 65  Resp: 16 20 20 20   Temp: 97.6 F (36.4 C) 97.6 F (36.4 C) 97.6 F (36.4 C) 98 F (36.7 C)  TempSrc: Oral Oral Oral Oral  SpO2: 97% 95% 98% 97%  Weight:      Height:       Filed Weights   08/23/21 0816 09/07/21 1431 09/07/21 1615  Weight: 81.6 kg 71.7 kg 71 kg   Body mass index is 22.46 kg/m.   General: NAD  Cardiovascular: S1, S2 present Respiratory: CTAB Abdomen: Soft, nontender, nondistended, bowel sounds present Musculoskeletal: No bilateral pedal edema  noted Skin: Normal Psychiatry: Normal mood    I have personally reviewed following labs and imaging studies  LABORATORY DATA: CBC: Recent Labs  Lab 09/06/21 0317 09/07/21 0125 09/08/21 0530 09/09/21 0336 09/10/21 0229  WBC 9.0 10.9* 7.5 12.7* 13.1*  NEUTROABS  --   --   --   --  9.4*  HGB 9.6* 9.6* 9.3* 9.3* 9.3*  HCT 30.7* 30.6* 29.5* 27.9* 29.1*  MCV 85.0 85.2 85.0 81.8 84.3  PLT 380 370 380 389 409*    Basic Metabolic Panel: Recent Labs  Lab 09/06/21 0317 09/07/21 0125 09/08/21 0530 09/09/21 0336 09/10/21 0229  NA 138 134* 133* 130* 134*  K 4.0 3.9 3.3* 4.1 4.1  CL 102 98 98 95* 98  CO2 25 23 23  20* 21*  GLUCOSE 95 89 105* 226* 118*  BUN 33* 36* 32* 40* 41*  CREATININE 5.97* 6.39* 5.52* 5.43* 5.04*  CALCIUM 8.1* 8.0* 7.8* 7.6* 7.8*  MG  --   --  1.7  --   --   PHOS 4.5 5.6* 3.9 2.3* 2.2*    GFR: Estimated Creatinine Clearance: 13.5 mL/min (A) (by C-G formula based on SCr of 5.04 mg/dL (H)).  Liver Function Tests: Recent Labs  Lab 09/06/21 0317 09/07/21 0125 09/08/21 0530 09/09/21 0336 09/10/21 0229  ALBUMIN 1.8* 1.7* 1.6* 1.7* 1.6*   No results for input(s): LIPASE, AMYLASE in the last 168 hours.  No results for input(s): AMMONIA in the last 168  hours.  Coagulation Profile: No results for input(s): INR, PROTIME in the last 168 hours.   Cardiac Enzymes: No results for input(s): CKTOTAL, CKMB, CKMBINDEX, TROPONINI in the last 168 hours.  BNP (last 3 results) No results for input(s): PROBNP in the last 8760 hours.  Lipid Profile: No results for input(s): CHOL, HDL, LDLCALC, TRIG, CHOLHDL, LDLDIRECT in the last 72 hours.  Thyroid Function Tests: No results for input(s): TSH, T4TOTAL, FREET4, T3FREE, THYROIDAB in the last 72 hours.  Anemia Panel: No results for input(s): VITAMINB12, FOLATE, FERRITIN, TIBC, IRON, RETICCTPCT in the last 72 hours.   Urine analysis:    Component Value Date/Time   COLORURINE RED (A) 09/06/2021 1229   APPEARANCEUR TURBID (A) 09/06/2021 1229   LABSPEC  09/06/2021 1229    TEST NOT REPORTED DUE TO COLOR INTERFERENCE OF URINE PIGMENT   PHURINE  09/06/2021 1229    TEST NOT REPORTED DUE TO COLOR INTERFERENCE OF URINE PIGMENT   GLUCOSEU (A) 09/06/2021 1229    TEST NOT REPORTED DUE TO COLOR INTERFERENCE OF URINE PIGMENT   HGBUR (A) 09/06/2021 1229    TEST NOT REPORTED DUE TO COLOR INTERFERENCE OF URINE PIGMENT   BILIRUBINUR (A) 09/06/2021 1229    TEST NOT REPORTED DUE TO COLOR INTERFERENCE OF URINE PIGMENT   KETONESUR (A) 09/06/2021 1229    TEST NOT REPORTED DUE TO COLOR INTERFERENCE OF URINE PIGMENT   PROTEINUR (A) 09/06/2021 1229    TEST NOT REPORTED DUE TO COLOR INTERFERENCE OF URINE PIGMENT   UROBILINOGEN 0.2 06/26/2007 1023   NITRITE (A) 09/06/2021 1229    TEST NOT REPORTED DUE TO COLOR INTERFERENCE OF URINE PIGMENT   LEUKOCYTESUR (A) 09/06/2021 1229    TEST NOT REPORTED DUE TO COLOR INTERFERENCE OF URINE PIGMENT    Sepsis Labs: Lactic Acid, Venous    Component Value Date/Time   LATICACIDVEN 0.8 09/06/2021 1512    MICROBIOLOGY: Recent Results (from the past 240 hour(s))  Urine Culture     Status: Abnormal   Collection Time: 09/06/21  12:29 PM   Specimen: Urine, Clean Catch   Result Value Ref Range Status   Specimen Description URINE, CLEAN CATCH  Final   Special Requests NONE  Final   Culture (A)  Final    >=100,000 COLONIES/mL GROUP B STREP(S.AGALACTIAE)ISOLATED TESTING AGAINST S. AGALACTIAE NOT ROUTINELY PERFORMED DUE TO PREDICTABILITY OF AMP/PEN/VAN SUSCEPTIBILITY. Performed at [facility], [place] [address] [place], [place] [address]    Report Status 09/07/2021 FINAL  Final  Culture, blood (routine x 2)     Status: Abnormal   Collection Time: 09/06/21 12:34 PM   Specimen: BLOOD RIGHT HAND  Result Value Ref Range Status   Specimen Description BLOOD RIGHT HAND  Final   Special Requests   Final    BOTTLES DRAWN AEROBIC AND ANAEROBIC Blood Culture adequate volume   Culture  Setup Time   Final    GRAM POSITIVE COCCI IN CHAINS IN BOTH AEROBIC AND ANAEROBIC BOTTLES CRITICAL RESULT CALLED TO, READ BACK BY AND VERIFIED WITH: V BRYK,PHARMD@[id number]  09/07/21 [place] Performed at [facility], [place] [address], [place], [place] [address]    Culture STREPTOCOCCUS AGALACTIAE (A)  Final   Report Status 09/09/2021 FINAL  Final   Organism ID, Bacteria STREPTOCOCCUS AGALACTIAE  Final      Susceptibility   Streptococcus agalactiae - MIC*    CLINDAMYCIN >=1 RESISTANT Resistant     ERYTHROMYCIN >=8 RESISTANT Resistant     VANCOMYCIN 0.5 SENSITIVE Sensitive     CEFTRIAXONE <=0.12 SENSITIVE Sensitive     LEVOFLOXACIN 1 SENSITIVE Sensitive     PENICILLIN Value in next row Sensitive      SENSITIVE<=0.06    * STREPTOCOCCUS AGALACTIAE  Blood Culture ID Panel (Reflexed)     Status: Abnormal   Collection Time: 09/06/21 12:34 PM  Result Value Ref Range Status   Enterococcus faecalis NOT DETECTED NOT DETECTED Final   Enterococcus Faecium NOT DETECTED NOT DETECTED Final   Listeria monocytogenes NOT DETECTED NOT DETECTED Final   Staphylococcus species NOT DETECTED NOT DETECTED Final   Staphylococcus aureus (BCID) NOT DETECTED NOT DETECTED Final   Staphylococcus  epidermidis NOT DETECTED NOT DETECTED Final   Staphylococcus lugdunensis NOT DETECTED NOT DETECTED Final   Streptococcus species DETECTED (A) NOT DETECTED Final    Comment: CRITICAL RESULT CALLED TO, READ BACK BY AND VERIFIED WITH: V BRYK,PHARMD@[id number]  09/07/21 [place]    Streptococcus agalactiae DETECTED (A) NOT DETECTED Final    Comment: CRITICAL RESULT CALLED TO, READ BACK BY AND VERIFIED WITH: V BRYK,PHARMD@[id number]  09/07/21 [place]    Streptococcus pneumoniae NOT DETECTED NOT DETECTED Final   Streptococcus pyogenes NOT DETECTED NOT DETECTED Final   A.calcoaceticus-baumannii NOT DETECTED NOT DETECTED Final   Bacteroides fragilis NOT DETECTED NOT DETECTED Final   Enterobacterales NOT DETECTED NOT DETECTED Final   Enterobacter cloacae complex NOT DETECTED NOT DETECTED Final   Escherichia coli NOT DETECTED NOT DETECTED Final   Klebsiella aerogenes NOT DETECTED NOT DETECTED Final   Klebsiella oxytoca NOT DETECTED NOT DETECTED Final   Klebsiella pneumoniae NOT DETECTED NOT DETECTED Final   Proteus species NOT DETECTED NOT DETECTED Final   Salmonella species NOT DETECTED NOT DETECTED Final   Serratia marcescens NOT DETECTED NOT DETECTED Final   Haemophilus influenzae NOT DETECTED NOT DETECTED Final   Neisseria meningitidis NOT DETECTED NOT DETECTED Final   Pseudomonas aeruginosa NOT DETECTED NOT DETECTED Final   Stenotrophomonas maltophilia NOT DETECTED NOT DETECTED Final   Candida albicans NOT DETECTED NOT DETECTED Final   Candida auris NOT  DETECTED NOT DETECTED Final   Candida glabrata NOT DETECTED NOT DETECTED Final   Candida krusei NOT DETECTED NOT DETECTED Final   Candida parapsilosis NOT DETECTED NOT DETECTED Final   Candida tropicalis NOT DETECTED NOT DETECTED Final   Cryptococcus neoformans/gattii NOT DETECTED NOT DETECTED Final    Comment: Performed at [facility], [place] [address], [place], [place] [address]  Culture, blood (routine x 2)     Status: Abnormal   Collection Time:  09/06/21 12:46 PM   Specimen: BLOOD RIGHT HAND  Result Value Ref Range Status   Specimen Description BLOOD RIGHT HAND  Final   Special Requests   Final    BOTTLES DRAWN AEROBIC AND ANAEROBIC Blood Culture adequate volume   Culture  Setup Time   Final    GRAM POSITIVE COCCI IN CHAINS IN BOTH AEROBIC AND ANAEROBIC BOTTLES CRITICAL VALUE NOTED.  VALUE IS CONSISTENT WITH PREVIOUSLY REPORTED AND CALLED VALUE.    Culture (A)  Final    STREPTOCOCCUS AGALACTIAE SUSCEPTIBILITIES PERFORMED ON PREVIOUS CULTURE WITHIN THE LAST 5 DAYS. Performed at [facility], [place] [address], [place], [place] [address]    Report Status 09/09/2021 FINAL  Final  Culture, blood (Routine X 2) w Reflex to ID Panel     Status: None (Preliminary result)   Collection Time: 09/08/21  5:30 AM   Specimen: BLOOD LEFT HAND  Result Value Ref Range Status   Specimen Description BLOOD LEFT HAND  Final   Special Requests AEROBIC BOTTLE ONLY Blood Culture adequate volume  Final   Culture   Final    NO GROWTH 2 DAYS Performed at [facility], [place] [address] [place], [place] [address]    Report Status PENDING  Incomplete  Culture, blood (routine x 2)     Status: None (Preliminary result)   Collection Time: 09/08/21  5:31 AM   Specimen: BLOOD RIGHT HAND  Result Value Ref Range Status   Specimen Description BLOOD RIGHT HAND  Final   Special Requests AEROBIC BOTTLE ONLY Blood Culture adequate volume  Final   Culture   Final    NO GROWTH 2 DAYS Performed at [facility], [place] [address] [place], [place] [address]    Report Status PENDING  Incomplete     RADIOLOGY STUDIES/RESULTS: ECHOCARDIOGRAM COMPLETE  Result Date: 09/08/2021    ECHOCARDIOGRAM REPORT   Patient Name:   REINHARD SCHACK Date of Exam: 09/08/2021 Medical Rec #:  [id number]    Height:       70.0 in Accession #:    [id number]   Weight:       156.5 lb Date of Birth:  [birth year]     BSA:          1.881 m Patient Age:    41 years     BP:            130/78 mmHg Patient Gender: M            HR:           75 bpm. Exam Location:  Inpatient Procedure: 2D Echo, Cardiac Doppler and Color Doppler Indications:    Bacteremia R78.81  History:        Patient has prior history of Echocardiogram examinations, most                 recent 08/24/2021. Risk Factors:Current Smoker. Generalized  weakness/shortness of breath-found to have AKI and severe                 normocytic anemia.  Sonographer:    Alvino Chapel RCS Referring Phys: [id number] [place]  1. Left ventricular ejection fraction, by estimation, is 45 to 50%. The left ventricle has mildly decreased function. The left ventricle demonstrates global hypokinesis. There is moderate concentric left ventricular hypertrophy. Left ventricular diastolic parameters are consistent with Grade II diastolic dysfunction (pseudonormalization).  2. Right ventricular systolic function is normal. The right ventricular size is normal. Tricuspid regurgitation signal is inadequate for assessing PA pressure.  3. Left atrial size was moderately dilated.  4. A small to moderate pericardial effusion is present. The pericardial effusion is anterior to the right ventricle. There is no evidence of cardiac tamponade.  5. The mitral valve is grossly normal. Mild mitral valve regurgitation.  6. The aortic valve is tricuspid. There is mild thickening of the aortic valve. Aortic valve regurgitation is not visualized.  7. The inferior vena cava is normal in size with greater than 50% respiratory variability, suggesting right atrial pressure of 3 mmHg.  8. No obvious valvular vegetations. Comparison(s): Prior images reviewed side by side. LVEF mildly reduced and pericardial effusion is new. FINDINGS  Left Ventricle: Left ventricular ejection fraction, by estimation, is 45 to 50%. The left ventricle has mildly decreased function. The left ventricle demonstrates global hypokinesis. The left ventricular internal cavity  size was normal in size. There is  moderate concentric left ventricular hypertrophy. Left ventricular diastolic parameters are consistent with Grade II diastolic dysfunction (pseudonormalization). Right Ventricle: The right ventricular size is normal. No increase in right ventricular wall thickness. Right ventricular systolic function is normal. Tricuspid regurgitation signal is inadequate for assessing PA pressure. Left Atrium: Left atrial size was moderately dilated. Right Atrium: Right atrial size was normal in size. Pericardium: A small pericardial effusion is present. The pericardial effusion is anterior to the right ventricle. There is no evidence of cardiac tamponade. Mitral Valve: The mitral valve is grossly normal. There is mild thickening of the mitral valve leaflet(s). Mild mitral annular calcification. Mild mitral valve regurgitation. Tricuspid Valve: The tricuspid valve is grossly normal. Tricuspid valve regurgitation is trivial. Aortic Valve: The aortic valve is tricuspid. There is mild thickening of the aortic valve. There is mild to moderate aortic valve annular calcification. Aortic valve regurgitation is not visualized. Pulmonic Valve: The pulmonic valve was grossly normal. Pulmonic valve regurgitation is trivial. Aorta: The aortic root is normal in size and structure. Venous: The inferior vena cava is normal in size with greater than 50% respiratory variability, suggesting right atrial pressure of 3 mmHg. IAS/Shunts: No atrial level shunt detected by color flow Doppler.  LEFT VENTRICLE PLAX 2D LVIDd:         5.10 cm  Diastology LVIDs:         3.90 cm  LV e' medial:    3.77 cm/s LV PW:         1.50 cm  LV E/e' medial:  22.9 LV IVS:        1.40 cm  LV e' lateral:   7.18 cm/s LVOT diam:     2.00 cm  LV E/e' lateral: 12.0 LV SV:         51 LV SV Index:   27 LVOT Area:     3.14 cm  RIGHT VENTRICLE RV S prime:     14.40 cm/s TAPSE (M-mode): 1.9 cm LEFT  ATRIUM              Index       RIGHT ATRIUM            Index LA diam:        4.20 cm  2.23 cm/m  RA Area:     14.50 cm LA Vol (A2C):   104.0 ml 55.29 ml/m RA Volume:   36.00 ml  19.14 ml/m LA Vol (A4C):   64.4 ml  34.24 ml/m LA Biplane Vol: 83.7 ml  44.50 ml/m  AORTIC VALVE LVOT Vmax:   89.00 cm/s LVOT Vmean:  53.600 cm/s LVOT VTI:    0.161 m  AORTA Ao Root diam: 3.50 cm MITRAL VALVE MV Area (PHT): 3.65 cm    SHUNTS MV Decel Time: 208 msec    Systemic VTI:  0.16 m MV E velocity: 86.40 cm/s  Systemic Diam: 2.00 cm MV A velocity: 63.70 cm/s MV E/A ratio:  1.36 Rozann Lesches MD Electronically signed by Rozann Lesches MD Signature Date/Time: 09/08/2021/5:26:36 PM    Final      LOS: 18 days   Alma Friendly, MD  [facility]    09/10/2021, 1:01 PM

## 2021-09-10 NOTE — Progress Notes (Addendum)
HEMATOLOGY-ONCOLOGY PROGRESS NOTE  SUBJECTIVE: [name] is feeling better today. He just returned from having a cardiac MRI.  He is requesting his as needed pain medication and antiemetic prior to receiving chemotherapy today.  REVIEW OF SYSTEMS:   Constitutional: Denies fevers, chills  Eyes: Denies blurriness of vision Ears, nose, mouth, throat, and face: Denies mucositis or sore throat Respiratory: Denies cough, dyspnea or wheezes Cardiovascular: Denies palpitation, chest discomfort Gastrointestinal: Denies nausea and vomiting Skin: Denies abnormal skin rashes Lymphatics: Denies new lymphadenopathy or easy bruising Neurological:Denies numbness, tingling or new weaknesses Behavioral/Psych: Mood is stable, no new changes  Extremities: No lower extremity edema All other systems were reviewed with the patient and are negative.  I have reviewed the past medical history, past surgical history, social history and family history with the patient and they are unchanged from previous note.   PHYSICAL EXAMINATION: ECOG PERFORMANCE STATUS: 2 - Symptomatic, <50% confined to bed  Vitals:   09/10/21 0524 09/10/21 0959  BP: 131/84 131/83  Pulse: 62 65  Resp: 20 20  Temp: 97.6 F (36.4 C) 98 F (36.7 C)  SpO2: 98% 97%   Filed Weights   08/23/21 0816 09/07/21 1431 09/07/21 1615  Weight: 81.6 kg 71.7 kg 71 kg    Intake/Output from previous day: 09/18 0701 - 09/19 0700 In: 500 [IV Piggyback:500] Out: 1600 [Urine:1600]  GENERAL:alert, no distress and comfortable SKIN: skin color, texture, turgor are normal, no rashes or significant lesions EYES: normal, Conjunctiva are pink and non-injected, sclera clear OROPHARYNX:no exudate, no erythema and lips, buccal mucosa, and tongue normal  LUNGS: clear to auscultation and percussion with normal breathing effort HEART: regular rate & rhythm and no murmurs and no lower extremity edema ABDOMEN:abdomen soft, non-tender and normal bowel  sounds NEURO: alert & oriented x 3 with fluent speech, no focal motor/sensory deficits  LABORATORY DATA:  I have reviewed the data as listed CMP Latest Ref Rng & Units 09/10/2021 09/09/2021 09/08/2021  Glucose 70 - 99 mg/dL 118(H) 226(H) 105(H)  BUN 8 - 23 mg/dL 41(H) 40(H) 32(H)  Creatinine 0.61 - 1.24 mg/dL 5.04(H) 5.43(H) 5.52(H)  Sodium 135 - 145 mmol/L 134(L) 130(L) 133(L)  Potassium 3.5 - 5.1 mmol/L 4.1 4.1 3.3(L)  Chloride 98 - 111 mmol/L 98 95(L) 98  CO2 22 - 32 mmol/L 21(L) 20(L) 23  Calcium 8.9 - 10.3 mg/dL 7.8(L) 7.6(L) 7.8(L)  Total Protein 6.5 - 8.1 g/dL - - -  Total Bilirubin 0.3 - 1.2 mg/dL - - -  Alkaline Phos 38 - 126 U/L - - -  AST 15 - 41 U/L - - -  ALT 0 - 44 U/L - - -    Lab Results  Component Value Date   WBC 13.1 (H) 09/10/2021   HGB 9.3 (L) 09/10/2021   HCT 29.1 (L) 09/10/2021   MCV 84.3 09/10/2021   PLT 409 (H) 09/10/2021   NEUTROABS 9.4 (H) 09/10/2021    CT ABDOMEN PELVIS WO CONTRAST  Result Date: 08/30/2021 CLINICAL DATA:  Retroperitoneal hematoma, follow up s/p random renal bx with perinephric hematoma development EXAM: CT ABDOMEN AND PELVIS WITHOUT CONTRAST TECHNIQUE: Multidetector CT imaging of the abdomen and pelvis was performed following the standard protocol without IV contrast. COMPARISON:  September 6 FINDINGS: Inferior chest: Trace bilateral pleural effusions with right greater than left compressive subsegmental atelectasis. Hepatobiliary: The liver is normal in size without focal abnormality. No intrahepatic or extrahepatic biliary ductal dilation. The gallbladder appears normal. Spleen: Normal in size without focal abnormality. Pancreas:  No pancreatic ductal dilatation or surrounding inflammatory changes. Adrenals/Urinary Tract: Adrenal glands are unremarkable. The kidneys are normal in size. Tiny perinephric hematoma along the right renal lower pole is unchanged. Hyperdense material in the right collecting system and bladder consistent with blood  products, grossly similar. Stomach/Bowel: The stomach, small bowel and large bowel are normal in caliber without abnormal wall thickening or surrounding inflammatory changes. Reproductive: Prostate is unremarkable. Lymphatic: No enlarged lymph nodes in the abdomen or pelvis. Vasculature: The abdominal aorta is normal in caliber. Aortic atherosclerosis. Other: No abdominopelvic ascites. Musculoskeletal: No aggressive osseous lesions. Degenerative changes at L5-S1. Bone island in the left ilium. IMPRESSION: The small perinephric hematoma along the right renal lower pole is stable, and within expected limits after percutaneous biopsy. However, there remains substantial clot burden within the bladder. Follow-up urology recommendations for management. Electronically Signed   By: Albin Felling M.D.   On: 08/30/2021 14:21   CT ABDOMEN PELVIS WO CONTRAST  Result Date: 08/29/2021 CLINICAL DATA:  Post right-sided renal biopsy, now with hematuria and hypotension. EXAM: CT ABDOMEN AND PELVIS WITHOUT CONTRAST TECHNIQUE: Multidetector CT imaging of the abdomen and pelvis was performed following the standard protocol without IV contrast. COMPARISON:  CT abdomen pelvis-08/23/2021; ultrasound-guided right renal biopsy-earlier same day FINDINGS: The lack of intravenous contrast limits the ability to evaluate solid abdominal organs. Lower chest: Limited visualization of the lower thorax demonstrates interval development of trace bilateral effusion with worsening bibasilar heterogeneous/consolidative opacities, right greater than left. Previously identified nonspecific ground-glass opacities within the imaged lung bases is not seen on the present examination though there is mild residual intraseptal thickening. Normal heart size. Trace amount of pericardial fluid, unchanged presumably physiologic. There is diffuse decreased attenuation intra cardiac blood pool suggestive of anemia. Hepatobiliary: Normal hepatic contour. Apparent  high density material within the gallbladder could represent biliary sludge. No definitive gallbladder wall thickening or pericholecystic stranding on this noncontrast examination. No ascites. Pancreas: Normal noncontrast appearance of the pancreas. Spleen: Normal noncontrast appearance of the spleen. Adrenals/Urinary Tract: There is a very tiny (approximately 2.7 x 1.6 x 1.2 cm) perinephric hematoma about the inferior pole of the right kidney (axial image 44, series 3; coronal image 57, series 6), with minimal amount of adjacent perinephric stranding. High-density material is seen within the right renal collecting system and ureter with moderate to large amount of layering high-density material within urinary bladder, findings compatible with hemorrhage into the collecting system and bladder. Mild associated right-sided pelviectasis and ureterectasis. Normal noncontrast appearance of the left kidney. No evidence of left-sided nephrolithiasis or urinary obstruction. Normal noncontrast appearance of the bilateral adrenal glands. Stomach/Bowel: Scattered minimal colonic diverticulosis without evidence of superimposed acute diverticulitis on this noncontrast examination. Normal appearance of the terminal ileum. The appendix is not visualized compatible with provided operative history. No discrete areas of bowel wall thickening on this noncontrast examination. No pneumoperitoneum, pneumatosis or portal venous gas. Vascular/Lymphatic: Moderate amount of atherosclerotic plaque within normal caliber abdominal aorta. Scattered retroperitoneal lymph nodes are numerous though individually not enlarged by size criteria with index left sided periaortic lymph node measuring 0.7 cm in greatest short axis diameter (image 31, series 3), presumably reactive in etiology. No bulky retroperitoneal, mesenteric, pelvic or inguinal lymphadenopathy on this noncontrast examination Reproductive: Dystrophic calcifications within normal sized  prostate gland. Trace amount of fluid within the pelvic cul-de-sac. Other: Small bilateral mesenteric fat containing inguinal hernias, left greater than right. Minimal amount of subcutaneous edema about the midline of the low back. Presumed  shrapnel is seen within the right lower abdominal/pelvic ventral abdominal wall. Musculoskeletal: No acute or aggressive osseous abnormalities. Mild-to-moderate multilevel lumbar spine DDD, worse at L4-L5 and L5-S1 with disc space height loss, endplate irregularity and small posteriorly directed disc osteophyte complexes at these locations. Mild degenerative change the bilateral hips with joint space loss, subchondral sclerosis and osteophytosis, right greater than left. IMPRESSION: 1. Post right-sided renal biopsy complicated by tiny (approximately 2.7 cm) perinephric hematoma and bleeding into the right renal collecting system including moderate to large-sized clot within the urinary bladder and associated mild right-sided pelviectasis and ureterectasis. Consideration for initiation of continuous bladder irrigation could be performed as indicated. 2. Trace bilateral effusions with associated bibasilar opacities, right greater than left, likely atelectasis. 3. Colonic diverticulosis without evidence superimposed acute diverticulitis. 4.  Aortic Atherosclerosis (ICD10-I70.0). Critical Value/emergent results were called by telephone at the time of interpretation on 08/28/2021 at 5:04 pm to provider [facility] , who verbally acknowledged these results. Electronically Signed   By: Sandi Mariscal M.D.   On: 08/29/2021 10:38   DG Chest 2 View  Result Date: 09/06/2021 CLINICAL DATA:  Fever EXAM: CHEST - 2 VIEW COMPARISON:  09/03/2021 FINDINGS: Right IJ dialysis catheter tip med right atrium. Midline trachea. Normal heart size. Left costophrenic angle minimally excluded the frontal radiograph. Small bilateral pleural effusions. No pneumothorax. Improved interstitial edema with  minimal pulmonary venous congestion remaining. Persistent mild bibasilar atelectasis. IMPRESSION: Improved interstitial edema with mild pulmonary venous congestion remaining. Small bilateral pleural effusions with adjacent atelectasis. Electronically Signed   By: Abigail Miyamoto M.D.   On: 09/06/2021 13:49   DG Chest 2 View  Result Date: 08/23/2021 CLINICAL DATA:  Near syncope. EXAM: CHEST - 2 VIEW COMPARISON:  None. FINDINGS: The lungs are clear without focal pneumonia, edema, pneumothorax or pleural effusion. Cardiopericardial silhouette is at upper limits of normal for size. The visualized bony structures of the thorax show no acute abnormality. Telemetry leads overlie the chest. IMPRESSION: No active cardiopulmonary disease. Electronically Signed   By: Misty Stanley M.D.   On: 08/23/2021 09:15   NM Pulmonary Perfusion  Result Date: 08/23/2021 CLINICAL DATA:  PE suspected, shortness of breath EXAM: NUCLEAR MEDICINE PERFUSION LUNG SCAN TECHNIQUE: Perfusion images were obtained in multiple projections after intravenous injection of radiopharmaceutical. Ventilation scans intentionally deferred if perfusion scan and chest x-ray adequate for interpretation during COVID 19 epidemic. RADIOPHARMACEUTICALS:  4.2 mCi Tc-19mMAA IV COMPARISON:  Same-day chest radiographs FINDINGS: Normal, homogeneous pulmonary perfusion. No suspicious filling defects. IMPRESSION: Very low probability examination for pulmonary embolism by modified perfusion only PIOPED criteria (PE absent). Electronically Signed   By: AEddie CandleM.D.   On: 08/23/2021 15:59   IR Angiogram Renal Left Selective  INDICATION: 70year old male with history of acute kidney injury of uncertain etiology status post ultrasound-guided right renal biopsy on 08/28/2021. Since biopsy, the patient has experienced gross hematuria with associated acute anemia.   EXAM: 1. Ultrasound-guided vascular access of the right internal jugular vein. 2. Temporary hemodialysis  catheter placement. 3. Ultrasound-guided vascular access of the right common femoral artery. 4. Selective catheterization and angiography of the right renal artery. 5. Sub selective catheterization angiography of right inferior polar and arcuate artery branches. 6. Coil embolization of right inferior pole arcuate artery branch.   MEDICATIONS: None.   ANESTHESIA/SEDATION: Moderate (conscious) sedation was employed during this procedure. A total of Versed 3 mg and Fentanyl 50 mcg was administered intravenously.   Moderate Sedation Time: 66 minutes. The patient's  level of consciousness and vital signs were monitored continuously by radiology nursing throughout the procedure under my direct supervision.   CONTRAST:  44m OMNIPAQUE IOHEXOL 350 MG/ML SOLN, 546mOMNIPAQUE IOHEXOL 350 MG/ML SOLN   FLUOROSCOPY TIME:  Fluoroscopy Time: 10.6 minutes, (811 mGy).   COMPLICATIONS: None immediate.   PROCEDURE: Informed consent was obtained from the patient following explanation of the procedure, risks, benefits and alternatives. The patient understands, agrees and consents for the procedure. All questions were addressed. A time out was performed prior to the initiation of the procedure. Maximal barrier sterile technique utilized including caps, mask, sterile gowns, sterile gloves, large sterile drape, hand hygiene, and chlorhexidine prep.   Preprocedure ultrasound evaluation of the right internal jugular vein demonstrated a patent and compressible vein free of internal echoes. Procedure was planned. Subdermal Local anesthesia was provided 1% lidocaine. A small skin nick was made. Under direct ultrasound visualization, a 21 gauge micropuncture needle was directed into the internal jugular vein. An image was captured and stored in the permanent record. A micropuncture set was inserted and exchanged for a J wire which was positioned in the inferior vena cava. Serial dilation was performed followed by placement of a 12.5 French, 24 cm  Trialysis catheter. The catheter tip was positioned in the right atrium. Each lumen flushed and aspirated appropriately. The dialysis ports were locked with appropriate volume of heparin dwell. The middle central venous port was then used for sedation for the remainder of the procedure. The catheter was secured with a 0 silk retention suture. A sterile bandage was applied.   Preprocedure ultrasound evaluation of the right groin was performed which demonstrated a patent right common femoral artery. The procedure was planned. Subdermal Local anesthesia was provided with 1% lidocaine. A small skin nick was made. Under direct ultrasound visualization, a 21 gauge micropuncture needle was directed into the common femoral artery. An ultrasound image was captured and stored in the permanent record. A micro puncture set was inserted and a limited right lower extremity angiogram was performed which demonstrated appropriate puncture site for closure device use. A J wire was directed to the abdominal aorta and the micropuncture set was exchanged for a 5 Fr[place] sheath. A 5 French C2 catheter was then directed into the right renal ostium. Right renal angiogram was performed. The single main renal artery was patent. 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   A straight lantern microcatheter and 0.014" soft synchro wire was then inserted and directed into the inferior polar branch. Repeat angiogram was performed in the sub selective location which was significant for multifocal 2-4 mm pseudoaneurysm formation, abnormal truncation and irregularity of the arcuate and interlobular branches, an early arteriovenous shunting. The inferior polar arcuate branch was selected further. Coil embolization was performed with  multiple low profile Penumbra Ruby coils ranging from 2-3 mm in diameter. Completion right inferior renal angiogram was performed which demonstrated appropriate embolization of the targeted vessels without persistent arteriovenous fistula or vessel irregularity.   The catheters were removed. The right common femoral artery was then closed with a 6 FrPakistanngio-Seal device. Distal pulses were unchanged. The patient tolerated the procedure well was transferred back to the floor in good condition.   IMPRESSION: 1. 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 2. Sub selective coil embolization of right inferior polar arcuate artery branch. 3. Successful placement of right internal jugular, 24 French Trialysis catheter with the catheter tip in the right atrium. The catheter is ready for immediate use.   Ruthann Cancer, MD   [facility]   [facility]     Electronically Signed   By: Ruthann Cancer M.D.   On: 08/31/2021 09:29    IR Fluoro Guide CV Line Right  Result Date: 08/31/2021 INDICATION: 70 year old male with history of acute kidney injury of uncertain etiology status post ultrasound-guided right renal biopsy on 08/28/2021. Since biopsy, the patient has experienced gross hematuria with associated acute anemia. EXAM: 1. Ultrasound-guided vascular access of the right internal jugular vein. 2. Temporary hemodialysis catheter placement. 3. Ultrasound-guided vascular access of the right common femoral artery. 4. Selective catheterization and angiography of the right renal artery. 5. Sub selective catheterization angiography of right inferior polar and arcuate artery branches. 6. Coil embolization of right inferior pole arcuate artery branch. MEDICATIONS: None. ANESTHESIA/SEDATION: Moderate (conscious) sedation was employed during  this procedure. A total of Versed 3 mg and Fentanyl 50 mcg was administered intravenously. Moderate Sedation Time: 66 minutes. The patient's level of consciousness and vital signs were monitored continuously by radiology nursing throughout the procedure under my direct supervision. CONTRAST:  63m OMNIPAQUE IOHEXOL 350 MG/ML SOLN, 569mOMNIPAQUE IOHEXOL 350 MG/ML SOLN FLUOROSCOPY TIME:  Fluoroscopy Time: 10.6 minutes, (811 mGy). COMPLICATIONS: None immediate. PROCEDURE: Informed consent was obtained from the patient following explanation of the procedure, risks, benefits and alternatives. The patient understands, agrees and consents for the procedure. All questions were addressed. A time out was performed prior to the initiation of the procedure. Maximal barrier sterile technique utilized including caps, mask, sterile gowns, sterile gloves, large sterile drape, hand hygiene, and chlorhexidine prep. Preprocedure ultrasound evaluation of the right internal jugular vein demonstrated a patent and compressible vein free of internal echoes. Procedure was planned. Subdermal Local anesthesia was provided 1% lidocaine. A small skin nick was made. Under direct ultrasound visualization, a 21 gauge micropuncture needle was directed into the internal jugular vein. An image was captured and stored in the permanent record. A micropuncture set was inserted and exchanged for a J wire which was positioned in the inferior vena cava. Serial dilation was performed followed by placement of a 12.5 French, 24 cm Trialysis catheter. The catheter tip was positioned in the right atrium. Each lumen flushed and aspirated appropriately. The dialysis ports were locked with appropriate volume of heparin dwell. The middle central venous port was then used for sedation for the remainder of the procedure. The catheter was secured with a 0 silk retention suture. A sterile bandage was applied. Preprocedure ultrasound evaluation of the right groin was  performed which demonstrated a patent right common femoral artery. The procedure was planned. Subdermal Local anesthesia was provided with 1% lidocaine. A small skin nick was made. Under direct ultrasound visualization, a 21 gauge micropuncture needle was directed into the common femoral artery. An ultrasound image was captured and stored in the permanent record. A micro puncture set was inserted and a limited right lower extremity angiogram was performed which demonstrated appropriate puncture site for closure device use. A J wire was directed to the abdominal aorta and the micropuncture set was exchanged for a 5 Fr[place]ascular sheath. A 5 French C2 catheter was then directed into the right renal ostium. Right  renal angiogram was performed. The single main renal artery was patent. 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 A straight lantern microcatheter and 0.014" soft synchro wire was then inserted and directed into the inferior polar branch. Repeat angiogram was performed in the sub selective location which was significant for multifocal 2-4 mm pseudoaneurysm formation, abnormal truncation and irregularity of the arcuate and interlobular branches, an early arteriovenous shunting. The inferior polar arcuate branch was selected further. Coil embolization was performed with multiple low profile Penumbra Ruby coils ranging from 2-3 mm in diameter. Completion right inferior renal angiogram was performed which demonstrated appropriate embolization of the targeted vessels without persistent arteriovenous fistula or vessel irregularity. The catheters were removed. The right common femoral artery was then closed with a 6 [place] Angio-Seal device. Distal pulses were unchanged. The patient tolerated the procedure  well was transferred back to the floor in good condition. IMPRESSION: 1. 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 2. Sub selective coil embolization of right inferior polar arcuate artery branch. 3. Successful placement of right internal jugular, 24 French Trialysis catheter with the catheter tip in the right atrium. The catheter is ready for immediate use. Ruthann Cancer, MD [facility] Radiology Electronically Signed   By: Ruthann Cancer M.D.   On: 08/31/2021 09:29   IR US Guide Vasc Access Right  Result Date: 08/31/2021 INDICATION: 70 year old male with history of acute kidney injury of uncertain etiology status post ultrasound-guided right renal biopsy on 08/28/2021. Since biopsy, the patient has experienced gross hematuria with associated acute anemia. EXAM: 1. Ultrasound-guided vascular access of the right internal jugular vein. 2. Temporary hemodialysis catheter placement. 3. Ultrasound-guided vascular access of the right common femoral artery. 4. Selective catheterization and angiography of the right renal artery. 5. Sub selective catheterization angiography of right inferior polar and arcuate artery branches. 6. Coil embolization of right inferior pole arcuate artery branch. MEDICATIONS: None. ANESTHESIA/SEDATION: Moderate (conscious) sedation was employed during this procedure. A total of Versed 3 mg and Fentanyl 50 mcg was administered intravenously. Moderate Sedation Time: 66 minutes. The patient's level of consciousness and vital signs were monitored continuously by radiology nursing throughout the procedure under my direct supervision. CONTRAST:  69m OMNIPAQUE IOHEXOL 350 MG/ML SOLN, 518mOMNIPAQUE IOHEXOL 350 MG/ML SOLN FLUOROSCOPY TIME:  Fluoroscopy Time: 10.6 minutes, (811 mGy). COMPLICATIONS: None  immediate. PROCEDURE: Informed consent was obtained from the patient following explanation of the procedure, risks, benefits and alternatives. The patient understands, agrees and consents for the procedure. All questions were addressed. A time out was performed prior to the initiation of the procedure. Maximal barrier sterile technique utilized including caps, mask, sterile gowns, sterile gloves, large sterile drape, hand hygiene, and chlorhexidine prep. Preprocedure ultrasound evaluation of the right internal jugular vein demonstrated a patent and compressible vein free of internal echoes. Procedure was planned. Subdermal Local anesthesia was provided 1% lidocaine. A small skin nick was made. Under direct ultrasound visualization, a 21 gauge micropuncture needle was directed into the internal jugular vein. An image was captured and stored in the permanent record. A micropuncture set was inserted and exchanged for a J wire which was positioned in the inferior vena cava. Serial dilation was performed  followed by placement of a 12.5 [place], 24 cm Trialysis catheter. The catheter tip was positioned in the right atrium. Each lumen flushed and aspirated appropriately. The dialysis ports were locked with appropriate volume of heparin dwell. The middle central venous port was then used for sedation for the remainder of the procedure. The catheter was secured with a 0 silk retention suture. A sterile bandage was applied. Preprocedure ultrasound evaluation of the right groin was performed which demonstrated a patent right common femoral artery. The procedure was planned. Subdermal Local anesthesia was provided with 1% lidocaine. A small skin nick was made. Under direct ultrasound visualization, a 21 gauge micropuncture needle was directed into the common femoral artery. An ultrasound image was captured and stored in the permanent record. A micro puncture set was inserted and a limited right lower extremity angiogram was  performed which demonstrated appropriate puncture site for closure device use. A J wire was directed to the abdominal aorta and the micropuncture set was exchanged for a 5 [place] vascular sheath. A 5 French C2 catheter was then directed into the right renal ostium. Right renal angiogram was performed. The single main renal artery was patent. 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 A straight lantern microcatheter and 0.014" soft synchro wire was then inserted and directed into the inferior polar branch. Repeat angiogram was performed in the sub selective location which was significant for multifocal 2-4 mm pseudoaneurysm formation, abnormal truncation and irregularity of the arcuate and interlobular branches, an early arteriovenous shunting. The inferior polar arcuate branch was selected further. Coil embolization was performed with multiple low profile Penumbra Ruby coils ranging from 2-3 mm in diameter. Completion right inferior renal angiogram was performed which demonstrated appropriate embolization of the targeted vessels without persistent arteriovenous fistula or vessel irregularity. The catheters were removed. The right common femoral artery was then closed with a 6 [place] Angio-Seal device. Distal pulses were unchanged. The patient tolerated the procedure well was transferred back to the floor in good condition. IMPRESSION: 1. 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 2. Sub selective coil embolization of right inferior polar arcuate artery branch. 3. Successful placement of right internal jugular, 24 French Trialysis catheter with the catheter tip in  the right atrium. The catheter is ready for immediate use. Ruthann Cancer, MD [facility] Electronically Signed   By: Ruthann Cancer M.D.   On: 08/31/2021 09:29   IR US Guide Vasc Access Right  Result Date: 08/31/2021 INDICATION: 70 year old male with history of acute kidney injury of uncertain etiology status post ultrasound-guided right renal biopsy on 08/28/2021. Since biopsy, the patient has experienced gross hematuria with associated acute anemia. EXAM: 1. Ultrasound-guided vascular access of the right internal jugular vein. 2. Temporary hemodialysis catheter placement. 3. Ultrasound-guided vascular access of the right common femoral artery. 4. Selective catheterization and angiography of the right renal artery. 5. Sub selective catheterization angiography of right inferior polar and arcuate artery branches. 6. Coil embolization of right inferior pole arcuate artery branch. MEDICATIONS: None. ANESTHESIA/SEDATION: Moderate (conscious) sedation was employed during this procedure. A total of Versed 3 mg and Fentanyl 50 mcg was administered intravenously. Moderate Sedation Time: 66 minutes. The patient's level of  consciousness and vital signs were monitored continuously by radiology nursing throughout the procedure under my direct supervision. CONTRAST:  61m OMNIPAQUE IOHEXOL 350 MG/ML SOLN, 511mOMNIPAQUE IOHEXOL 350 MG/ML SOLN FLUOROSCOPY TIME:  Fluoroscopy Time: 10.6 minutes, (811 mGy). COMPLICATIONS: None immediate. PROCEDURE: Informed consent was obtained from the patient following explanation of the procedure, risks, benefits and alternatives. The patient understands, agrees and consents for the procedure. All questions were addressed. A time out was performed prior to the initiation of the procedure. Maximal barrier sterile technique utilized including caps, mask, sterile gowns, sterile gloves, large sterile drape, hand hygiene, and chlorhexidine  prep. Preprocedure ultrasound evaluation of the right internal jugular vein demonstrated a patent and compressible vein free of internal echoes. Procedure was planned. Subdermal Local anesthesia was provided 1% lidocaine. A small skin nick was made. Under direct ultrasound visualization, a 21 gauge micropuncture needle was directed into the internal jugular vein. An image was captured and stored in the permanent record. A micropuncture set was inserted and exchanged for a J wire which was positioned in the inferior vena cava. Serial dilation was performed followed by placement of a 12.5 French, 24 cm Trialysis catheter. The catheter tip was positioned in the right atrium. Each lumen flushed and aspirated appropriately. The dialysis ports were locked with appropriate volume of heparin dwell. The middle central venous port was then used for sedation for the remainder of the procedure. The catheter was secured with a 0 silk retention suture. A sterile bandage was applied. Preprocedure ultrasound evaluation of the right groin was performed which demonstrated a patent right common femoral artery. The procedure was planned. Subdermal Local anesthesia was provided with 1% lidocaine. A small skin nick was made. Under direct ultrasound visualization, a 21 gauge micropuncture needle was directed into the common femoral artery. An ultrasound image was captured and stored in the permanent record. A micro puncture set was inserted and a limited right lower extremity angiogram was performed which demonstrated appropriate puncture site for closure device use. A J wire was directed to the abdominal aorta and the micropuncture set was exchanged for a 5 [place] sheath. A 5 French C2 catheter was then directed into the right renal ostium. Right renal angiogram was performed. The single main renal artery was patent. About 2 arcuate artery branches in the inferior pole there is abnormal truncation and vessel irregularity with  evidence of an early filling arteriovenous fistula in addition to suggestion of faint filling into the collecting system on delayed imaging. A straight lantern microcatheter and 0.014" soft synchro wire was then inserted and directed into the inferior polar branch. Repeat angiogram was performed in the sub selective location which was significant for multifocal 2-4 mm pseudoaneurysm formation, abnormal truncation and irregularity of the arcuate and interlobular branches, an early arteriovenous shunting. The inferior polar arcuate branch was selected further. Coil embolization was performed with multiple low profile Penumbra Ruby coils ranging from 2-3 mm in diameter. Completion right inferior renal angiogram was performed which demonstrated appropriate embolization of the targeted vessels without persistent arteriovenous fistula or vessel irregularity. The catheters were removed. The right common femoral artery was then closed with a 6 Fr[place]gio-Seal device. Distal pulses were unchanged. The patient tolerated the procedure well was transferred back to the floor in good condition. IMPRESSION: 1. 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 2. Sub selective coil embolization of right inferior polar  arcuate artery branch. 3. Successful placement of right internal jugular, 24 French Trialysis catheter with the catheter tip in the right atrium. The catheter is ready for immediate use. Ruthann Cancer, MD [facility] Electronically Signed   By: Ruthann Cancer M.D.   On: 08/31/2021 09:29   DG Chest Port 1 View  Result Date: 09/03/2021 CLINICAL DATA:  Shortness of breath EXAM: PORTABLE CHEST 1 VIEW COMPARISON:  08/23/2021 FINDINGS: Right dialysis catheter in place with the tip in the right atrium. Heart is normal size.  Mild vascular congestion and interstitial prominence throughout the lungs, likely mild edema. No effusions or acute bony abnormality. IMPRESSION: Suspect mild pulmonary edema. Electronically Signed   By: Rolm Baptise M.D.   On: 09/03/2021 09:40   DG Bone Survey Met  Result Date: 09/08/2021 CLINICAL DATA:  Left hip pain. EXAM: METASTATIC BONE SURVEY COMPARISON:  None. FINDINGS: No metastatic lesions identified. No cause for left hip pain identified. IMPRESSION: No acute abnormalities identified. No metastatic lesions. No cause for hip pain noted. Electronically Signed   By: Dorise Bullion III M.D.   On: 09/08/2021 14:32   CT BONE MARROW BIOPSY & ASPIRATION  Result Date: 09/05/2021 CLINICAL DATA:  Amyloid kidney EXAM: CT GUIDED DEEP ILIAC BONE ASPIRATION AND CORE BIOPSY TECHNIQUE: Patient was placed prone on the CT gantry and limited axial scans through the pelvis were obtained. Appropriate skin entry site was identified. Skin site was marked, prepped with chlorhexidine, draped in usual sterile fashion, and infiltrated locally with 1% lidocaine. Intravenous Fentanyl 108mg and Versed 263mwere administered as conscious sedation during continuous monitoring of the patient's level of consciousness and physiological / cardiorespiratory status by the radiology RN, with a total moderate sedation time of 10 minutes. Under CT fluoroscopic guidance an 11-gauge Cook trocar bone needle was advanced into the right iliac bone just lateral to the sacroiliac joint. Once needle tip position was confirmed, core and aspiration samples were obtained, submitted to pathology for approval. Post procedure scans show no hematoma or fracture. Patient tolerated procedure well. COMPLICATIONS: COMPLICATIONS none IMPRESSION: 1. Technically successful CT guided right iliac bone core and aspiration biopsy. Electronically Signed   By: D Lucrezia Europe.D.   On: 09/05/2021 10:48   ECHOCARDIOGRAM COMPLETE  Result Date: 09/08/2021     ECHOCARDIOGRAM REPORT   Patient Name:   Caleb COURYate of Exam: 09/08/2021 Medical Rec #:  [id number]  Height:       70.0 in Accession #:    [id number] Weight:       156.5 lb Date of Birth:  [birth year]   BSA:          1.881 m Patient Age:    [age]     BP:           130/78 mmHg Patient Gender: M            HR:           75 bpm. Exam Location:  Inpatient Procedure: 2D Echo, Cardiac Doppler and Color Doppler Indications:    Bacteremia R78.81  History:        Patient has prior history of Echocardiogram examinations, most                 recent 08/24/2021. Risk Factors:Current Smoker. Generalized                 weakness/shortness of breath-found to have AKI and severe  normocytic anemia.  Sonographer:    Alvino Chapel RCS Referring Phys: [id number] [place]  1. Left ventricular ejection fraction, by estimation, is 45 to 50%. The left ventricle has mildly decreased function. The left ventricle demonstrates global hypokinesis. There is moderate concentric left ventricular hypertrophy. Left ventricular diastolic parameters are consistent with Grade II diastolic dysfunction (pseudonormalization).  2. Right ventricular systolic function is normal. The right ventricular size is normal. Tricuspid regurgitation signal is inadequate for assessing PA pressure.  3. Left atrial size was moderately dilated.  4. A small to moderate pericardial effusion is present. The pericardial effusion is anterior to the right ventricle. There is no evidence of cardiac tamponade.  5. The mitral valve is grossly normal. Mild mitral valve regurgitation.  6. The aortic valve is tricuspid. There is mild thickening of the aortic valve. Aortic valve regurgitation is not visualized.  7. The inferior vena cava is normal in size with greater than 50% respiratory variability, suggesting right atrial pressure of 3 mmHg.  8. No obvious valvular vegetations. Comparison(s): Prior images reviewed side by side. LVEF  mildly reduced and pericardial effusion is new. FINDINGS  Left Ventricle: Left ventricular ejection fraction, by estimation, is 45 to 50%. The left ventricle has mildly decreased function. The left ventricle demonstrates global hypokinesis. The left ventricular internal cavity size was normal in size. There is  moderate concentric left ventricular hypertrophy. Left ventricular diastolic parameters are consistent with Grade II diastolic dysfunction (pseudonormalization). Right Ventricle: The right ventricular size is normal. No increase in right ventricular wall thickness. Right ventricular systolic function is normal. Tricuspid regurgitation signal is inadequate for assessing PA pressure. Left Atrium: Left atrial size was moderately dilated. Right Atrium: Right atrial size was normal in size. Pericardium: A small pericardial effusion is present. The pericardial effusion is anterior to the right ventricle. There is no evidence of cardiac tamponade. Mitral Valve: The mitral valve is grossly normal. There is mild thickening of the mitral valve leaflet(s). Mild mitral annular calcification. Mild mitral valve regurgitation. Tricuspid Valve: The tricuspid valve is grossly normal. Tricuspid valve regurgitation is trivial. Aortic Valve: The aortic valve is tricuspid. There is mild thickening of the aortic valve. There is mild to moderate aortic valve annular calcification. Aortic valve regurgitation is not visualized. Pulmonic Valve: The pulmonic valve was grossly normal. Pulmonic valve regurgitation is trivial. Aorta: The aortic root is normal in size and structure. Venous: The inferior vena cava is normal in size with greater than 50% respiratory variability, suggesting right atrial pressure of 3 mmHg. IAS/Shunts: No atrial level shunt detected by color flow Doppler.  LEFT VENTRICLE PLAX 2D LVIDd:         5.10 cm  Diastology LVIDs:         3.90 cm  LV e' medial:    3.77 cm/s LV PW:         1.50 cm  LV E/e' medial:  22.9  LV IVS:        1.40 cm  LV e' lateral:   7.18 cm/s LVOT diam:     2.00 cm  LV E/e' lateral: 12.0 LV SV:         51 LV SV Index:   27 LVOT Area:     3.14 cm  RIGHT VENTRICLE RV S prime:     14.40 cm/s TAPSE (M-mode): 1.9 cm LEFT ATRIUM              Index       RIGHT ATRIUM  Index LA diam:        4.20 cm  2.23 cm/m  RA Area:     14.50 cm LA Vol (A2C):   104.0 ml 55.29 ml/m RA Volume:   36.00 ml  19.14 ml/m LA Vol (A4C):   64.4 ml  34.24 ml/m LA Biplane Vol: 83.7 ml  44.50 ml/m  AORTIC VALVE LVOT Vmax:   89.00 cm/s LVOT Vmean:  53.600 cm/s LVOT VTI:    0.161 m  AORTA Ao Root diam: 3.50 cm MITRAL VALVE MV Area (PHT): 3.65 cm    SHUNTS MV Decel Time: 208 msec    Systemic VTI:  0.16 m MV E velocity: 86.40 cm/s  Systemic Diam: 2.00 cm MV A velocity: 63.70 cm/s MV E/A ratio:  1.36 Rozann Lesches MD Electronically signed by Rozann Lesches MD Signature Date/Time: 09/08/2021/5:26:36 PM    Final    ECHOCARDIOGRAM COMPLETE  Result Date: 08/24/2021    ECHOCARDIOGRAM REPORT   Patient Name:   Caleb Simmons Date of Exam: 08/24/2021 Medical Rec #:  [id number]    Height:       70.0 in Accession #:    [id number]   Weight:       180.0 lb Date of Birth:  07-Jul-1951     BSA:          1.996 m Patient Age:    69 years     BP:           120/69 mmHg Patient Gender: M            HR:           85 bpm. Exam Location:  Inpatient Procedure: 2D Echo, Cardiac Doppler and Color Doppler Indications:    Abnormal EKG  History:        Patient has no prior history of Echocardiogram examinations.                 COPD, Signs/Symptoms:Dyspnea and Weakness, renal disease,                 anemia, elevated troponin; Risk Factors:Current Smoker.  Sonographer:    Dustin Flock RDCS Referring Phys: [place]  Sonographer Comments: Image acquisition challenging due to COPD. IMPRESSIONS  1. Left ventricular ejection fraction, by estimation, is 55 to 60%. The left ventricle has normal function. The left ventricle has no regional wall  motion abnormalities. There is mild concentric left ventricular hypertrophy. Left ventricular diastolic parameters are consistent with Grade I diastolic dysfunction (impaired relaxation).  2. Right ventricular systolic function is normal. The right ventricular size is normal. Tricuspid regurgitation signal is inadequate for assessing PA pressure.  3. The mitral valve is grossly normal. No evidence of mitral valve regurgitation. No evidence of mitral stenosis.  4. The aortic valve was not well visualized. Aortic valve regurgitation is not visualized. No aortic stenosis is present.  5. The inferior vena cava is normal in size with greater than 50% respiratory variability, suggesting right atrial pressure of 3 mmHg. Comparison(s): No prior Echocardiogram. FINDINGS  Left Ventricle: Left ventricular ejection fraction, by estimation, is 55 to 60%. The left ventricle has normal function. The left ventricle has no regional wall motion abnormalities. The left ventricular internal cavity size was normal in size. There is  mild concentric left ventricular hypertrophy. Left ventricular diastolic parameters are consistent with Grade I diastolic dysfunction (impaired relaxation). Right Ventricle: The right ventricular size is normal. No increase in right ventricular wall thickness. Right ventricular systolic  function is normal. Tricuspid regurgitation signal is inadequate for assessing PA pressure. Left Atrium: Left atrial size was normal in size. Right Atrium: Right atrial size was normal in size. Pericardium: There is no evidence of pericardial effusion. Mitral Valve: The mitral valve is grossly normal. No evidence of mitral valve regurgitation. No evidence of mitral valve stenosis. Tricuspid Valve: The tricuspid valve is normal in structure. Tricuspid valve regurgitation is not demonstrated. No evidence of tricuspid stenosis. Aortic Valve: The aortic valve was not well visualized. Aortic valve regurgitation is not visualized.  No aortic stenosis is present. Pulmonic Valve: The pulmonic valve was not well visualized. Pulmonic valve regurgitation is not visualized. Aorta: The aortic root is normal in size and structure. Venous: The inferior vena cava is normal in size with greater than 50% respiratory variability, suggesting right atrial pressure of 3 mmHg. IAS/Shunts: The atrial septum is grossly normal.  LEFT VENTRICLE PLAX 2D LVIDd:         5.80 cm      Diastology LVIDs:         3.60 cm      LV e' medial:    5.55 cm/s LV PW:         1.30 cm      LV E/e' medial:  14.5 LV IVS:        1.30 cm      LV e' lateral:   6.74 cm/s LVOT diam:     2.40 cm      LV E/e' lateral: 12.0 LV SV:         76 LV SV Index:   38 LVOT Area:     4.52 cm  LV Volumes (MOD) LV vol d, MOD A4C: 124.0 ml LV vol s, MOD A4C: 47.8 ml LV SV MOD A4C:     124.0 ml RIGHT VENTRICLE RV Basal diam:  2.80 cm RV S prime:     10.10 cm/s TAPSE (M-mode): 2.9 cm LEFT ATRIUM             Index       RIGHT ATRIUM           Index LA diam:        3.80 cm 1.90 cm/m  RA Area:     13.10 cm LA Vol (A2C):   31.8 ml 15.93 ml/m RA Volume:   29.70 ml  14.88 ml/m LA Vol (A4C):   33.0 ml 16.53 ml/m LA Biplane Vol: 34.9 ml 17.49 ml/m  AORTIC VALVE LVOT Vmax:   98.30 cm/s LVOT Vmean:  60.300 cm/s LVOT VTI:    0.169 m  AORTA Ao Root diam: 3.30 cm MITRAL VALVE MV Area (PHT): 3.85 cm    SHUNTS MV Decel Time: 197 msec    Systemic VTI:  0.17 m MV E velocity: 80.60 cm/s  Systemic Diam: 2.40 cm MV A velocity: 49.30 cm/s MV E/A ratio:  1.63 Rudean Haskell MD Electronically signed by Rudean Haskell MD Signature Date/Time: 08/24/2021/11:38:40 AM    Final    CT Renal Stone Study  Result Date: 08/23/2021 CLINICAL DATA:  Hematuria, new renal failure EXAM: CT ABDOMEN AND PELVIS WITHOUT CONTRAST TECHNIQUE: Multidetector CT imaging of the abdomen and pelvis was performed following the standard protocol without IV contrast. COMPARISON:  06/26/2007 FINDINGS: Lower chest: There is mild, scattered,  nonspecific ground-glass and fine nodularity throughout the included bilateral lung bases (series 5, 4). Hepatobiliary: No solid liver abnormality is seen. No gallstones, gallbladder wall thickening, or biliary dilatation. Pancreas: Unremarkable. No  pancreatic ductal dilatation or surrounding inflammatory changes. Spleen: Normal in size without significant abnormality. Adrenals/Urinary Tract: Adrenal glands are unremarkable. Kidneys are normal, without renal calculi, solid lesion, or hydronephrosis. Distended urinary bladder, measuring at least 17.4 cm. Stomach/Bowel: Stomach is within normal limits. Appendix not clearly visualized and may be surgically. No evidence of bowel wall thickening, distention, or inflammatory changes. Descending and sigmoid diverticulosis. Vascular/Lymphatic: Aortic atherosclerosis. No enlarged abdominal or pelvic lymph nodes. Reproductive: No mass or other significant abnormality. Other: Small, fat containing bilateral inguinal hernias no abdominopelvic ascites. Musculoskeletal: No acute or significant osseous findings. IMPRESSION: 1. No evidence of urinary tract calculus or hydronephrosis. 2. Distended urinary bladder, measuring at least 17.4 cm. Correlate for urinary retention. 3. Descending and sigmoid diverticulosis without evidence of acute diverticulitis. 4. Mild, nonspecific scattered ground-glass and fine nodularity throughout the bilateral lung bases, possibly infectious or inflammatory. Aortic Atherosclerosis (ICD10-I70.0). Electronically Signed   By: Eddie Candle M.D.   On: 08/23/2021 16:04   US BIOPSY (KIDNEY)  Result Date: 08/28/2021 INDICATION: Acute kidney injury of uncertain etiology. Please perform image guided biopsy for tissue diagnostic purposes. EXAM: ULTRASOUND GUIDED RENAL BIOPSY COMPARISON:  CT abdomen and pelvis-08/23/2021 MEDICATIONS: None. ANESTHESIA/SEDATION: Fentanyl 100 mcg IV; Versed 2 mg IV Total Moderate Sedation time: 14 minutes; The patient was  continuously monitored during the procedure by the interventional radiology nurse under my direct supervision. COMPLICATIONS: None immediate. PROCEDURE: Informed written consent was obtained from the patient after a discussion of the risks, benefits and alternatives to treatment. The patient understands and consents the procedure. A timeout was performed prior to the initiation of the procedure. Ultrasound scanning was performed of the bilateral flanks. The inferior pole of the right kidney was selected for biopsy due to location and sonographic window. The procedure was planned. The operative site was prepped and draped in the usual sterile fashion. The overlying soft tissues were anesthetized with 1% lidocaine with epinephrine. A 17 gauge core needle biopsy device was advanced into the inferior cortex of the right kidney and 3 core biopsies were obtained under direct ultrasound guidance. Images were saved for documentation purposes. The biopsy device was removed and hemostasis was obtained with manual compression. Post procedural scanning was negative for significant post procedural hemorrhage or additional complication. A dressing was placed. The patient tolerated the procedure well without immediate post procedural complication. IMPRESSION: Technically successful ultrasound guided right renal biopsy. Electronically Signed   By: Sandi Mariscal M.D.   On: 08/28/2021 12:52   IR EMBO ART  VEN HEMORR LYMPH EXTRAV  INC GUIDE ROADMAPPING  Result Date: 08/31/2021 INDICATION: 70 year old male with history of acute kidney injury of uncertain etiology status post ultrasound-guided right renal biopsy on 08/28/2021. Since biopsy, the patient has experienced gross hematuria with associated acute anemia. EXAM: 1. Ultrasound-guided vascular access of the right internal jugular vein. 2. Temporary hemodialysis catheter placement. 3. Ultrasound-guided vascular access of the right common femoral artery. 4. Selective catheterization  and angiography of the right renal artery. 5. Sub selective catheterization angiography of right inferior polar and arcuate artery branches. 6. Coil embolization of right inferior pole arcuate artery branch. MEDICATIONS: None. ANESTHESIA/SEDATION: Moderate (conscious) sedation was employed during this procedure. A total of Versed 3 mg and Fentanyl 50 mcg was administered intravenously. Moderate Sedation Time: 66 minutes. The patient's level of consciousness and vital signs were monitored continuously by radiology nursing throughout the procedure under my direct supervision. CONTRAST:  6m OMNIPAQUE IOHEXOL 350 MG/ML SOLN, 526mOMNIPAQUE IOHEXOL 350 MG/ML SOLN  FLUOROSCOPY TIME:  Fluoroscopy Time: 10.6 minutes, (811 mGy). COMPLICATIONS: None immediate. PROCEDURE: Informed consent was obtained from the patient following explanation of the procedure, risks, benefits and alternatives. The patient understands, agrees and consents for the procedure. All questions were addressed. A time out was performed prior to the initiation of the procedure. Maximal barrier sterile technique utilized including caps, mask, sterile gowns, sterile gloves, large sterile drape, hand hygiene, and chlorhexidine prep. Preprocedure ultrasound evaluation of the right internal jugular vein demonstrated a patent and compressible vein free of internal echoes. Procedure was planned. Subdermal Local anesthesia was provided 1% lidocaine. A small skin nick was made. Under direct ultrasound visualization, a 21 gauge micropuncture needle was directed into the internal jugular vein. An image was captured and stored in the permanent record. A micropuncture set was inserted and exchanged for a J wire which was positioned in the inferior vena cava. Serial dilation was performed followed by placement of a 12.5 French, 24 cm Trialysis catheter. The catheter tip was positioned in the right atrium. Each lumen flushed and aspirated appropriately. The dialysis  ports were locked with appropriate volume of heparin dwell. The middle central venous port was then used for sedation for the remainder of the procedure. The catheter was secured with a 0 silk retention suture. A sterile bandage was applied. Preprocedure ultrasound evaluation of the right groin was performed which demonstrated a patent right common femoral artery. The procedure was planned. Subdermal Local anesthesia was provided with 1% lidocaine. A small skin nick was made. Under direct ultrasound visualization, a 21 gauge micropuncture needle was directed into the common femoral artery. An ultrasound image was captured and stored in the permanent record. A micro puncture set was inserted and a limited right lower extremity angiogram was performed which demonstrated appropriate puncture site for closure device use. A J wire was directed to the abdominal aorta and the micropuncture set was exchanged for a 5 [place] vascular sheath. A 5 French C2 catheter was then directed into the right renal ostium. Right renal angiogram was performed. The single main renal artery was patent. 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 A straight lantern microcatheter and 0.014" soft synchro wire was then inserted and directed into the inferior polar branch. Repeat angiogram was performed in the sub selective location which was significant for multifocal 2-4 mm pseudoaneurysm formation, abnormal truncation and irregularity of the arcuate and interlobular branches, an early arteriovenous shunting. The inferior polar arcuate branch was selected further. Coil embolization was performed with multiple low profile Penumbra Ruby coils ranging from 2-3 mm in diameter. Completion right inferior renal angiogram was performed which  demonstrated appropriate embolization of the targeted vessels without persistent arteriovenous fistula or vessel irregularity. The catheters were removed. The right common femoral artery was then closed with a 6 [place] Angio-Seal device. Distal pulses were unchanged. The patient tolerated the procedure well was transferred back to the floor in good condition. IMPRESSION: 1. 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 2. Sub selective coil embolization of right inferior polar arcuate artery branch. 3. Successful placement of right internal jugular, 24 French Trialysis catheter with the catheter tip in the right atrium. The catheter is ready for immediate use. Ruthann Cancer,  MD [facility]iology Electronically Signed   By: Ruthann Cancer M.D.   On: 08/31/2021 09:29    Surgical Pathology  CASE: [id number]  PATIENT: Caleb Simmons  Bone Marrow Report   Clinical History: Amyloid , right Iliac (BH)   DIAGNOSIS:   BONE MARROW, ASPIRATE, CLOT, CORE:  - Plasma cell myeloma, see comment.   PERIPHERAL BLOOD:  - Normocytic anemia.   COMMENT:   The marrow is normocellular but exhibits increased monoclonal plasma  cells (17% aspirate, 15-20% CD138 immunohistochemistry). The findings  are consistent with plasma cell myeloma. [place] red is pending and will  be reported in an addendum. There is some atypia in the megakaryocytes  and FISH for MDS was added for completeness.   MICROSCOPIC DESCRIPTION:   PERIPHERAL BLOOD SMEAR: There is a normocytic anemia with occasional  hypochromic cells.  There is no rouleaux formation.  Leukocytes are  present in normal numbers.  Circulating plasma cells are not identified.  Platelets are present in normal numbers.   BONE MARROW ASPIRATE: Spicular and cellular.  Erythroid  precursors: Relative decrease in numbers.  No significant  dysplasia.  Granulocytic precursors: Relative increase in numbers.  No significant  dysplasia.  No increase in blasts.  Megakaryocytes: Mild increase in numbers.  Occasional forms with  hypolobated or abnormal nuclei.  Lymphocytes/plasma cells: Plasma cells are increased in numbers (6% by  manual differential counts) with atypical forms (multinucleation, large  forms).  Lymphocytes are not increased.   TOUCH PREPARATIONS: Similar to aspirate smears.   CLOT AND BIOPSY: The core biopsy and clot section are normocellular for  age (30%).  There is a mild myeloid hyperplasia.  Megakaryocytes are  increased in numbers with scattered atypical forms. CD138  immunohistochemistry reveals increased plasma cells (15-20%) which are  scattered and with small clusters. By light chain in situ hybridization  the plasma cells are lambda restricted.   IRON STAIN: Iron stains are performed on a bone marrow aspirate or touch  imprint smear and section of clot. The controls stained appropriately.        Storage Iron: Present       Ring Sideroblasts: Absent   ADDITIONAL DATA/TESTING: Cytogenetics, including FISH for myeloma and  MDS, was ordered and will be reported in an addendum.   CELL COUNT DATA:   Bone Marrow count performed on 500 cells shows:  Blasts:   0%   Myeloid:  66%  Promyelocytes: 0%   Erythroid:     11%  Myelocytes:    8%   Lymphocytes:   6%  Metamyelocytes:     1%   Plasma cells:  17%  Bands:    8%  Neutrophils:   41%  M:E ratio:     6.0  Eosinophils:   8%  Basophils:     0%  Monocytes:     0%   Lab Data: CBC performed on 09/05/21 shows:  WBC: 8.5 k/uL  Neutrophils:   57%  Hgb: 9.6 g/dL  Lymphocytes:   27%  HCT: 30.3 %    Monocytes:     8%  MCV: 84.6 fL   Eosinophils:   6%  RDW: 17.4 %    Basophils:     2%  PLT: 380 k/uL    ASSESSMENT AND PLAN: 1.  AL amyloidosis/plasma cell myeloma 2.  Anemia secondary to renal  insufficiency, iron deficiency, and folate deficiency 3.  Acute kidney injury secondary to #1 4.  History of nephrolithiasis 5.  Streptococcus agalactiae bacteremia diagnosed 09/07/2021   -We discussed proceeding with systemic chemotherapy consisting of CyBorD.  He has already been started on dexamethasone.  Plan to proceed with oral Cytoxan and Velcade today.  Plan is to proceed with chemotherapy weekly. -We discussed some adverse effects of this treatment including but not limited to myelosuppression, increased risk for infection, nausea and vomiting, injection site reaction.  He agrees to proceed. -Awaiting discharge plan to make further arrangements for next dose of chemotherapy.  If he remains in the hospital, we will plan to administer as an inpatient next week.  Otherwise, will arrange for outpatient chemotherapy in our office next week. -He has as needed Zofran available to him. -Begin acyclovir 200 mg every 12 hours.  Renally dosed. -His hemoglobin remains stable.  He is status post 4 doses of IV ferric gluconate.  He is also receiving folic acid.  Transfuse for hemoglobin less than 7.5. -He was seen by cardiology earlier today due to mildly reduced LVEF.  He had a cardiac MRI performed earlier today.  Will await results.   LOS: 18 days   Mikey Bussing, DNP, AGPCNP-BC, AOCNP 09/10/21  Addendum  I have seen the patient, and also spoke with his daughter in-law on the phone. I agree with the assessment and and plan and have edited the notes.   Lab and recent echo results reviewed. He is being treated for UTI, and was seen by cardiology today for mild CHF and underwent cardiac MRI. I also spoke with Dr. Justin Mend this morning. No HD planned for now. His overall condition is adequate to start chemo today, chemo consent obtained from patient, will proceed with first dose velcade injection and oral cyclophosphamide (with moderate dose reduction due to renal failure), with antiemetics.  I  coordinated his chemo with pharmacy and chemo RN Chrissie Noa today. Zofran and acyclovir ordered. His chemo treatment is weekly, plan to give second dose next Monday in our office if he is able to be discharged later this week, or in hospital if he remains hospitalized. We will f/u closely.   Truitt Merle  09/10/2021

## 2021-09-10 NOTE — Progress Notes (Signed)
[facility] ROUNDING NOTE   Subjective:   Interval History: 70 year old nephrolithiasis generalized weakness presented with acute kidney injury.  Status post renal biopsy 08/28/2021.  Had postbiopsy bleeding into the renal collecting system with clot retention.  Demonstration of three-way catheter performed.  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  She is undergoing dialysis 09/07/2021.  We are dialyzing on a as needed basis.  We are coordinating treatment with his chemotherapy.  Last dialysis was 09/07/2021 with 2.4 L removed.  Urine output 1.4 L 09/09/2021.  Blood pressure 131/84 pulse 95 temperature 97.5 O2 sats 98% room air  Sodium 134 potassium 4.1 chloride 96 CO2 21 BUN 41 creatinine 5 glucose 118 phosphorus 2.2 albumin 1.6 hemoglobin 9.3    Objective:  Vital signs in last 24 hours:  Temp:  [97.6 F (36.4 C)] 97.6 F (36.4 C) (09/19 0524) Pulse Rate:  [62-70] 62 (09/19 0524) Resp:  [16-20] 20 (09/19 0524) BP: (118-133)/(72-84) 131/84 (09/19 0524) SpO2:  [95 %-98 %] 98 % (09/19 0524)  Weight change:  Filed Weights   08/23/21 0816 09/07/21 1431 09/07/21 1615  Weight: 81.6 kg 71.7 kg 71 kg    Intake/Output: I/O last 3 completed shifts: In: 1722 [P.O.:472; IV Piggyback:1250] Out: 4163 [Urine:3275]   Intake/Output this shift:  No intake/output data recorded.  General: nad Heart: rrr Lungs: normal wob, cta bl Abdomen: Soft, nontender Extremities: No LE edema. Neurology: Alert, awake, following commands, no asterixis Vascular Access: Right IJ temporary HD catheter in place.   Basic Metabolic Panel: Recent Labs  Lab 09/06/21 0317 09/07/21 0125 09/08/21 0530 09/09/21 0336 09/10/21 0229  NA 138 134* 133* 130* 134*  K 4.0 3.9 3.3* 4.1 4.1  CL 102 98 98 95* 98  CO2 _0 20* 21*  GLUCOSE 95 89 105* 226* 118*  BUN 33* 36* 32* 40* 41*  CREATININE 5.97* 6.39* 5.52* 5.43* 5.04*  CALCIUM  8.1* 8.0* 7.8* 7.6* 7.8*  MG  --   --  1.7  --   --   PHOS 4.5 5.6* 3.9 2.3* 2.2*    Liver Function Tests: Recent Labs  Lab 09/06/21 0317 09/07/21 0125 09/08/21 0530 09/09/21 0336 09/10/21 0229  ALBUMIN 1.8* 1.7* 1.6* 1.7* 1.6*   No results for input(s): LIPASE, AMYLASE in the last 168 hours. No results for input(s): AMMONIA in the last 168 hours.  CBC: Recent Labs  Lab 09/06/21 0317 09/07/21 0125 09/08/21 0530 09/09/21 0336 09/10/21 0229  WBC 9.0 10.9* 7.5 12.7* 13.1*  NEUTROABS  --   --   --   --  9.4*  HGB 9.6* 9.6* 9.3* 9.3* 9.3*  HCT 30.7* 30.6* 29.5* 27.9* 29.1*  MCV 85.0 85.2 85.0 81.8 84.3  PLT 380 370 380 389 409*    Cardiac Enzymes: No results for input(s): CKTOTAL, CKMB, CKMBINDEX, TROPONINI in the last 168 hours.  BNP: Invalid input(s): POCBNP  CBG: No results for input(s): GLUCAP in the last 168 hours.  Microbiology: Results for orders placed or performed during the hospital encounter of 08/23/21  SARS CORONAVIRUS 2 (TAT 6-24 HRS) Nasopharyngeal Nasopharyngeal Swab     Status: None   Collection Time: 08/23/21  5:11 PM   Specimen: Nasopharyngeal Swab  Result Value Ref Range Status   SARS Coronavirus 2 NEGATIVE NEGATIVE Final    Comment: (NOTE) SARS-CoV-2 target nucleic acids are NOT DETECTED.  The SARS-CoV-2 RNA is generally detectable in upper and lower respiratory specimens during the acute  phase of infection. Negative results do not preclude SARS-CoV-2 infection, do not rule out co-infections with other pathogens, and should not be used as the sole basis for treatment or other patient management decisions. Negative results must be combined with clinical observations, patient history, and epidemiological information. The expected result is Negative.  Fact Sheet for Patients: SugarRoll.be  Fact Sheet for Healthcare Providers: https://www.woods-mathews.com/  This test is not yet approved or cleared  by the [place] FDA and  has been authorized for detection and/or diagnosis of SARS-CoV-2 by FDA under an Emergency Use Authorization (EUA). This EUA will remain  in effect (meaning this test can be used) for the duration of the COVID-19 declaration under Se ction 564(b)(1) of the Act, 21 U.S.C. section 360bbb-3(b)(1), unless the authorization is terminated or revoked sooner.  Performed at [facility], [place] [address] [place], [place] [address]   Urine Culture     Status: Abnormal   Collection Time: 09/06/21 12:29 PM   Specimen: Urine, Clean Catch  Result Value Ref Range Status   Specimen Description URINE, CLEAN CATCH  Final   Special Requests NONE  Final   Culture (A)  Final    >=100,000 COLONIES/mL GROUP B STREP(S.AGALACTIAE)ISOLATED TESTING AGAINST S. AGALACTIAE NOT ROUTINELY PERFORMED DUE TO PREDICTABILITY OF AMP/PEN/VAN SUSCEPTIBILITY. Performed at [facility], [place] [address], [place], [place] [address]    Report Status 09/07/2021 FINAL  Final  Culture, blood (routine x 2)     Status: Abnormal   Collection Time: 09/06/21 12:34 PM   Specimen: BLOOD RIGHT HAND  Result Value Ref Range Status   Specimen Description BLOOD RIGHT HAND  Final   Special Requests   Final    BOTTLES DRAWN AEROBIC AND ANAEROBIC Blood Culture adequate volume   Culture  Setup Time   Final    GRAM POSITIVE COCCI IN CHAINS IN BOTH AEROBIC AND ANAEROBIC BOTTLES CRITICAL RESULT CALLED TO, READ BACK BY AND VERIFIED WITH: V BRYK,PHARMD_[email or web address]  09/07/21 [place] Performed at [facility], [place] [address] [place], [place] [address]    Culture STREPTOCOCCUS AGALACTIAE (A)  Final   Report Status 09/09/2021 FINAL  Final   Organism ID, Bacteria STREPTOCOCCUS AGALACTIAE  Final      Susceptibility   Streptococcus agalactiae - MIC*    CLINDAMYCIN >=1 RESISTANT Resistant     ERYTHROMYCIN >=8 RESISTANT Resistant     VANCOMYCIN 0.5 SENSITIVE Sensitive     CEFTRIAXONE <=0.12 SENSITIVE Sensitive      LEVOFLOXACIN 1 SENSITIVE Sensitive     PENICILLIN Value in next row Sensitive      SENSITIVE<=0.06    * STREPTOCOCCUS AGALACTIAE  Blood Culture ID Panel (Reflexed)     Status: Abnormal   Collection Time: 09/06/21 12:34 PM  Result Value Ref Range Status   Enterococcus faecalis NOT DETECTED NOT DETECTED Final   Enterococcus Faecium NOT DETECTED NOT DETECTED Final   Listeria monocytogenes NOT DETECTED NOT DETECTED Final   Staphylococcus species NOT DETECTED NOT DETECTED Final   Staphylococcus aureus (BCID) NOT DETECTED NOT DETECTED Final   Staphylococcus epidermidis NOT DETECTED NOT DETECTED Final   Staphylococcus lugdunensis NOT DETECTED NOT DETECTED Final   Streptococcus species DETECTED (A) NOT DETECTED Final    Comment: CRITICAL RESULT CALLED TO, READ BACK BY AND VERIFIED WITH: V BRYK,PHARMD_1  09/07/21 [place]    Streptococcus agalactiae DETECTED (A) NOT DETECTED Final    Comment: CRITICAL RESULT CALLED TO, READ BACK BY AND VERIFIED WITH: V BRYK,PHARMD_2  09/07/21 [place]  Streptococcus pneumoniae NOT DETECTED NOT DETECTED Final   Streptococcus pyogenes NOT DETECTED NOT DETECTED Final   A.calcoaceticus-baumannii NOT DETECTED NOT DETECTED Final   Bacteroides fragilis NOT DETECTED NOT DETECTED Final   Enterobacterales NOT DETECTED NOT DETECTED Final   Enterobacter cloacae complex NOT DETECTED NOT DETECTED Final   Escherichia coli NOT DETECTED NOT DETECTED Final   Klebsiella aerogenes NOT DETECTED NOT DETECTED Final   Klebsiella oxytoca NOT DETECTED NOT DETECTED Final   Klebsiella pneumoniae NOT DETECTED NOT DETECTED Final   Proteus species NOT DETECTED NOT DETECTED Final   Salmonella species NOT DETECTED NOT DETECTED Final   Serratia marcescens NOT DETECTED NOT DETECTED Final   Haemophilus influenzae NOT DETECTED NOT DETECTED Final   Neisseria meningitidis NOT DETECTED NOT DETECTED Final   Pseudomonas aeruginosa NOT DETECTED NOT DETECTED Final   Stenotrophomonas maltophilia NOT  DETECTED NOT DETECTED Final   Candida albicans NOT DETECTED NOT DETECTED Final   Candida auris NOT DETECTED NOT DETECTED Final   Candida glabrata NOT DETECTED NOT DETECTED Final   Candida krusei NOT DETECTED NOT DETECTED Final   Candida parapsilosis NOT DETECTED NOT DETECTED Final   Candida tropicalis NOT DETECTED NOT DETECTED Final   Cryptococcus neoformans/gattii NOT DETECTED NOT DETECTED Final    Comment: Performed at [facility], [address] [place], [place] [address]  Culture, blood (routine x 2)     Status: Abnormal   Collection Time: 09/06/21 12:46 PM   Specimen: BLOOD RIGHT HAND  Result Value Ref Range Status   Specimen Description BLOOD RIGHT HAND  Final   Special Requests   Final    BOTTLES DRAWN AEROBIC AND ANAEROBIC Blood Culture adequate volume   Culture  Setup Time   Final    GRAM POSITIVE COCCI IN CHAINS IN BOTH AEROBIC AND ANAEROBIC BOTTLES CRITICAL VALUE NOTED.  VALUE IS CONSISTENT WITH PREVIOUSLY REPORTED AND CALLED VALUE.    Culture (A)  Final    STREPTOCOCCUS AGALACTIAE SUSCEPTIBILITIES PERFORMED ON PREVIOUS CULTURE WITHIN THE LAST 5 DAYS. Performed at [facility], [place] [address], [place], [place] [address]    Report Status 09/09/2021 FINAL  Final  Culture, blood (Routine X 2) w Reflex to ID Panel     Status: None (Preliminary result)   Collection Time: 09/08/21  5:30 AM   Specimen: BLOOD LEFT HAND  Result Value Ref Range Status   Specimen Description BLOOD LEFT HAND  Final   Special Requests AEROBIC BOTTLE ONLY Blood Culture adequate volume  Final   Culture   Final    NO GROWTH 1 DAY Performed at [facility], [place] [address] [place], [place] [address]    Report Status PENDING  Incomplete  Culture, blood (routine x 2)     Status: None (Preliminary result)   Collection Time: 09/08/21  5:31 AM   Specimen: BLOOD RIGHT HAND  Result Value Ref Range Status   Specimen Description BLOOD RIGHT HAND  Final   Special Requests AEROBIC BOTTLE  ONLY Blood Culture adequate volume  Final   Culture   Final    NO GROWTH 1 DAY Performed at [facility], [place] [address], [place], [place] [address]    Report Status PENDING  Incomplete    Coagulation Studies: No results for input(s): LABPROT, INR in the last 72 hours.  Urinalysis: No results for input(s): COLORURINE, LABSPEC, PHURINE, GLUCOSEU, HGBUR, BILIRUBINUR, KETONESUR, PROTEINUR, UROBILINOGEN, NITRITE, LEUKOCYTESUR in the last 72 hours.  Invalid input(s): APPERANCEUR  Imaging: DG Bone Survey Met  Result Date: 09/08/2021 CLINICAL DATA:  Left hip pain. EXAM: METASTATIC BONE SURVEY COMPARISON:  None. FINDINGS: No metastatic lesions identified. No cause for left hip pain identified. IMPRESSION: No acute abnormalities identified. No metastatic lesions. No cause for hip pain noted. Electronically Signed   By: Dorise Bullion III M.D.   On: 09/08/2021 14:32   ECHOCARDIOGRAM COMPLETE  Result Date: 09/08/2021    ECHOCARDIOGRAM REPORT   Patient Name:   Caleb Simmons Date of Exam: 09/08/2021 Medical Rec #:  [id number]    Height:       70.0 in Accession #:    [id number]   Weight:       156.5 lb Date of Birth:  03-31-1951     BSA:          1.881 m Patient Age:    2 years     BP:           130/78 mmHg Patient Gender: M            HR:           75 bpm. Exam Location:  Inpatient Procedure: 2D Echo, Cardiac Doppler and Color Doppler Indications:    Bacteremia R78.81  History:        Patient has prior history of Echocardiogram examinations, most                 recent 08/24/2021. Risk Factors:Current Smoker. Generalized                 weakness/shortness of breath-found to have AKI and severe                 normocytic anemia.  Sonographer:    Alvino Chapel RCS Referring Phys: [id number] [place]  1. Left ventricular ejection fraction, by estimation, is 45 to 50%. The left ventricle has mildly decreased function. The left ventricle demonstrates global hypokinesis. There is  moderate concentric left ventricular hypertrophy. Left ventricular diastolic parameters are consistent with Grade II diastolic dysfunction (pseudonormalization).  2. Right ventricular systolic function is normal. The right ventricular size is normal. Tricuspid regurgitation signal is inadequate for assessing PA pressure.  3. Left atrial size was moderately dilated.  4. A small to moderate pericardial effusion is present. The pericardial effusion is anterior to the right ventricle. There is no evidence of cardiac tamponade.  5. The mitral valve is grossly normal. Mild mitral valve regurgitation.  6. The aortic valve is tricuspid. There is mild thickening of the aortic valve. Aortic valve regurgitation is not visualized.  7. The inferior vena cava is normal in size with greater than 50% respiratory variability, suggesting right atrial pressure of 3 mmHg.  8. No obvious valvular vegetations. Comparison(s): Prior images reviewed side by side. LVEF mildly reduced and pericardial effusion is new. FINDINGS  Left Ventricle: Left ventricular ejection fraction, by estimation, is 45 to 50%. The left ventricle has mildly decreased function. The left ventricle demonstrates global hypokinesis. The left ventricular internal cavity size was normal in size. There is  moderate concentric left ventricular hypertrophy. Left ventricular diastolic parameters are consistent with Grade II diastolic dysfunction (pseudonormalization). Right Ventricle: The right ventricular size is normal. No increase in right ventricular wall thickness. Right ventricular systolic function is normal. Tricuspid regurgitation signal is inadequate for assessing PA pressure. Left Atrium: Left atrial size was moderately dilated. Right Atrium: Right atrial size was normal in size. Pericardium: A small pericardial effusion is present. The  pericardial effusion is anterior to the right ventricle. There is no evidence of cardiac tamponade. Mitral Valve: The mitral  valve is grossly normal. There is mild thickening of the mitral valve leaflet(s). Mild mitral annular calcification. Mild mitral valve regurgitation. Tricuspid Valve: The tricuspid valve is grossly normal. Tricuspid valve regurgitation is trivial. Aortic Valve: The aortic valve is tricuspid. There is mild thickening of the aortic valve. There is mild to moderate aortic valve annular calcification. Aortic valve regurgitation is not visualized. Pulmonic Valve: The pulmonic valve was grossly normal. Pulmonic valve regurgitation is trivial. Aorta: The aortic root is normal in size and structure. Venous: The inferior vena cava is normal in size with greater than 50% respiratory variability, suggesting right atrial pressure of 3 mmHg. IAS/Shunts: No atrial level shunt detected by color flow Doppler.  LEFT VENTRICLE PLAX 2D LVIDd:         5.10 cm  Diastology LVIDs:         3.90 cm  LV e' medial:    3.77 cm/s LV PW:         1.50 cm  LV E/e' medial:  22.9 LV IVS:        1.40 cm  LV e' lateral:   7.18 cm/s LVOT diam:     2.00 cm  LV E/e' lateral: 12.0 LV SV:         51 LV SV Index:   27 LVOT Area:     3.14 cm  RIGHT VENTRICLE RV S prime:     14.40 cm/s TAPSE (M-mode): 1.9 cm LEFT ATRIUM              Index       RIGHT ATRIUM           Index LA diam:        4.20 cm  2.23 cm/m  RA Area:     14.50 cm LA Vol (A2C):   104.0 ml 55.29 ml/m RA Volume:   36.00 ml  19.14 ml/m LA Vol (A4C):   64.4 ml  34.24 ml/m LA Biplane Vol: 83.7 ml  44.50 ml/m  AORTIC VALVE LVOT Vmax:   89.00 cm/s LVOT Vmean:  53.600 cm/s LVOT VTI:    0.161 m  AORTA Ao Root diam: 3.50 cm MITRAL VALVE MV Area (PHT): 3.65 cm    SHUNTS MV Decel Time: 208 msec    Systemic VTI:  0.16 m MV E velocity: 86.40 cm/s  Systemic Diam: 2.00 cm MV A velocity: 63.70 cm/s MV E/A ratio:  1.36 Rozann Lesches MD Electronically signed by Rozann Lesches MD Signature Date/Time: 09/08/2021/5:26:36 PM    Final      Medications:    sodium chloride     sodium chloride 10 mL/hr  at 08/27/21 2147   pencillin G potassium IV 4 Million Units (09/10/21 0404)   sodium chloride irrigation      Chlorhexidine Gluconate Cloth  6 each Topical Daily   feeding supplement (NEPRO CARB STEADY)  237 mL Oral TID BM   folic acid  2 mg Oral Daily   Gerhardt's butt cream   Topical BID   melatonin  5 mg Oral QHS   nicotine  14 mg Transdermal Q24H   pantoprazole  40 mg Oral BID AC   sodium bicarbonate  1,300 mg Oral TID   tamsulosin  0.4 mg Oral Daily   acetaminophen, ALPRAZolam, bisacodyl, diphenhydrAMINE, lidocaine, ondansetron **OR** ondansetron (ZOFRAN) IV, oxyCODONE, promethazine, technetium albumin aggregated, traMADol, traZODone  Assessment/ Plan:   #  Acute kidney injury, nonoliguric, with history of excessive NSAID's use/BC powders every day.  Peaked creatinine level of 6.95.  CT scan without obstruction or hydronephrosis.  UA with more than 300 protein and microscopic hematuria.  Serology evaluation including ANA, ANCA, C3, C4, SPEP, anti-GBM, double-stranded DNA, hep B, hep C, HIV negative.   -He underwent IR guided kidney biopsy on 9/6 complicated by postbiopsy bleeding. Biopsy: AL amyloidosis, lambda light chain composition (glomeruli, interstitium, arteries, and arterioles). Marked IF, 59% global glomerular sclerosis and marked arteriosclerosis. Final read/EM pending. -He had right IJ temporary HD catheter placed on 9/8 in IR during embolization of the bleeding artery.   -Suspected uremia given persistent lack of appetite, dysgeusia, hiccups therefore proceeded with HD on 9/16 (slow start protocol). Symptoms were remarkably better and is still the case today. Will hold on HD tomorrow especially given planned onc treatments tomorrow..Not sure if he will require another treatment in the near future therefore would recommend maintaining HD catheter -Check daily lab,s strict ins and out   #Post kidney biopsy bleeding, syncope/acute blood loss anemia: He initially received  continuous bladder irrigation.  Because of ongoing bleeding, he underwent embolization of right inferior pole arcuate artery and right IJ temporary HD catheter placement by IR on 9/8.  Still having some hematuria, not on CBI at the moment, urology is following; defer to them.  Monitor CBC.   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 Likely urinary source -iv pcn, per primary   #Pulm edema -on CXR 9/12, IVF stopped, received lasix 81m IV x 1 dose, prn diuresis. No need for diuretics today   #Anemia, complicated by acute blood loss post kidney biopsy: Urology and IR is following.  Received multiple units of blood transfusion.  Hemoglobin is stable today.On admission FOBT was negative and treated with IV iron. Holding on ESA for now given amyloidosis   #History of nephrolithiasis: Repeat CT scan without any obstruction or hydronephrosis.  Urology is following   #Metabolic acidosis: Continue oral sodium bicarbonate, monitor   LOS: 1[place]_0 _1 :43 AM

## 2021-09-10 NOTE — Progress Notes (Signed)
Oncology brief note  I have reviewed pt's chart including lab, and spoke with nephrologist Dr. Justin Mend this morning. Plan to give chemo Velcade injection today (if iv team is available, we have requested), and oral chemo cytoxan today. I spoke with his daughter in-law this morning and answered all questions. Will see pt in early afternoon.   Caleb Simmons  09/10/2021

## 2021-09-10 NOTE — Progress Notes (Signed)
Cyclophosphamide and Bortezomib drug information reviewed with patient and he was given drug information sheets. Patient signed Chemotherapy consent form (form placed in shadow chart). First doses given and patient tolerated well.  Zandra Abts [facility]  09/10/2021 2:43 PM

## 2021-09-10 NOTE — Plan of Care (Signed)
  Problem: Activity: Goal: Risk for activity intolerance will decrease Outcome: Progressing   Problem: Elimination: Goal: Will not experience complications related to bowel motility Outcome: Progressing   

## 2021-09-10 NOTE — Progress Notes (Signed)
,   Referring Physician(s): Feng,Yan  Supervising Physician: Ruthann Cancer  Patient Status:  [facility] - In-pt  Chief Complaint:  S/p right random renal bx on 03/30/8890 complicated by hypotension and near syncopal episode.  S/p Selective catheterization and embolization of right inferior pole arcuate artery on 9/8 S/p R iliac bone bx on 9/14   Subjective:  Pt sitting in bed, not in acute distress. Family member and RN at bedside.  Pt states that he just received chemotherapy and not feeling great overall.   Allergies: Patient has no known allergies.  Medications: Prior to Admission medications   Medication Sig Start Date End Date Taking? Authorizing Provider  amoxicillin-clavulanate (AUGMENTIN) 875-125 MG tablet Take 1 tablet by mouth 2 (two) times daily. 08/14/21  Yes [provider]  Aspirin-Salicylamide-Caffeine (BC FAST PAIN RELIEF) 650-195-33.3 MG PACK Take 1 Package by mouth daily as needed (pain).   Yes [provider]  promethazine-dextromethorphan (PROMETHAZINE-DM) 6.25-15 MG/5ML syrup Take 5 mLs by mouth every 4 (four) hours as needed for cough. 08/14/21  Yes [provider]     Vital Signs: BP 131/83   Pulse 65   Temp 98 F (36.7 C) (Oral)   Resp 20   Ht _0  (1.778 m)   Wt 156 lb 8.4 oz (71 kg)   SpO2 97%   BMI 22.46 kg/m   Physical Exam Vitals reviewed.  Constitutional:      General: He is not in acute distress. HENT:     Head: Normocephalic and atraumatic.  Cardiovascular:     Rate and Rhythm: Normal rate.  Pulmonary:     Effort: Pulmonary effort is normal.  Abdominal:     General: Abdomen is flat.  Skin:    General: Skin is warm and dry.     Coloration: Skin is not jaundiced or pale.  Neurological:     Mental Status: He is alert and oriented to person, place, and time.  Psychiatric:        Mood and Affect: Mood normal.        Behavior: Behavior normal.    Imaging: DG Bone Survey Met  Result Date:  09/08/2021 CLINICAL DATA:  Left hip pain. EXAM: METASTATIC BONE SURVEY COMPARISON:  None. FINDINGS: No metastatic lesions identified. No cause for left hip pain identified. IMPRESSION: No acute abnormalities identified. No metastatic lesions. No cause for hip pain noted. Electronically Signed   By: Dorise Bullion III M.D.   On: 09/08/2021 14:32   ECHOCARDIOGRAM COMPLETE  Result Date: 09/08/2021    ECHOCARDIOGRAM REPORT   Patient Name:   Caleb Simmons Date of Exam: 09/08/2021 Medical Rec #:  [id number]    Height:       70.0 in Accession #:    [id number]   Weight:       156.5 lb Date of Birth:  [birth year]     BSA:          1.881 m Patient Age:    70 years     BP:           130/78 mmHg Patient Gender: M            HR:           75 bpm. Exam Location:  Inpatient Procedure: 2D Echo, Cardiac Doppler and Color Doppler Indications:    Bacteremia R78.81  History:        Patient has prior history of Echocardiogram examinations, most  recent 08/24/2021. Risk Factors:Current Smoker. Generalized                 weakness/shortness of breath-found to have AKI and severe                 normocytic anemia.  Sonographer:    Alvino Chapel RCS Referring Phys: [id number] [place]  1. Left ventricular ejection fraction, by estimation, is 45 to 50%. The left ventricle has mildly decreased function. The left ventricle demonstrates global hypokinesis. There is moderate concentric left ventricular hypertrophy. Left ventricular diastolic parameters are consistent with Grade II diastolic dysfunction (pseudonormalization).  2. Right ventricular systolic function is normal. The right ventricular size is normal. Tricuspid regurgitation signal is inadequate for assessing PA pressure.  3. Left atrial size was moderately dilated.  4. A small to moderate pericardial effusion is present. The pericardial effusion is anterior to the right ventricle. There is no evidence of cardiac tamponade.  5. The mitral valve is  grossly normal. Mild mitral valve regurgitation.  6. The aortic valve is tricuspid. There is mild thickening of the aortic valve. Aortic valve regurgitation is not visualized.  7. The inferior vena cava is normal in size with greater than 50% respiratory variability, suggesting right atrial pressure of 3 mmHg.  8. No obvious valvular vegetations. Comparison(s): Prior images reviewed side by side. LVEF mildly reduced and pericardial effusion is new. FINDINGS  Left Ventricle: Left ventricular ejection fraction, by estimation, is 45 to 50%. The left ventricle has mildly decreased function. The left ventricle demonstrates global hypokinesis. The left ventricular internal cavity size was normal in size. There is  moderate concentric left ventricular hypertrophy. Left ventricular diastolic parameters are consistent with Grade II diastolic dysfunction (pseudonormalization). Right Ventricle: The right ventricular size is normal. No increase in right ventricular wall thickness. Right ventricular systolic function is normal. Tricuspid regurgitation signal is inadequate for assessing PA pressure. Left Atrium: Left atrial size was moderately dilated. Right Atrium: Right atrial size was normal in size. Pericardium: A small pericardial effusion is present. The pericardial effusion is anterior to the right ventricle. There is no evidence of cardiac tamponade. Mitral Valve: The mitral valve is grossly normal. There is mild thickening of the mitral valve leaflet(s). Mild mitral annular calcification. Mild mitral valve regurgitation. Tricuspid Valve: The tricuspid valve is grossly normal. Tricuspid valve regurgitation is trivial. Aortic Valve: The aortic valve is tricuspid. There is mild thickening of the aortic valve. There is mild to moderate aortic valve annular calcification. Aortic valve regurgitation is not visualized. Pulmonic Valve: The pulmonic valve was grossly normal. Pulmonic valve regurgitation is trivial. Aorta: The  aortic root is normal in size and structure. Venous: The inferior vena cava is normal in size with greater than 50% respiratory variability, suggesting right atrial pressure of 3 mmHg. IAS/Shunts: No atrial level shunt detected by color flow Doppler.  LEFT VENTRICLE PLAX 2D LVIDd:         5.10 cm  Diastology LVIDs:         3.90 cm  LV e' medial:    3.77 cm/s LV PW:         1.50 cm  LV E/e' medial:  22.9 LV IVS:        1.40 cm  LV e' lateral:   7.18 cm/s LVOT diam:     2.00 cm  LV E/e' lateral: 12.0 LV SV:         51 LV SV Index:   27 LVOT Area:  3.14 cm  RIGHT VENTRICLE RV S prime:     14.40 cm/s TAPSE (M-mode): 1.9 cm LEFT ATRIUM              Index       RIGHT ATRIUM           Index LA diam:        4.20 cm  2.23 cm/m  RA Area:     14.50 cm LA Vol (A2C):   104.0 ml 55.29 ml/m RA Volume:   36.00 ml  19.14 ml/m LA Vol (A4C):   64.4 ml  34.24 ml/m LA Biplane Vol: 83.7 ml  44.50 ml/m  AORTIC VALVE LVOT Vmax:   89.00 cm/s LVOT Vmean:  53.600 cm/s LVOT VTI:    0.161 m  AORTA Ao Root diam: 3.50 cm MITRAL VALVE MV Area (PHT): 3.65 cm    SHUNTS MV Decel Time: 208 msec    Systemic VTI:  0.16 m MV E velocity: 86.40 cm/s  Systemic Diam: 2.00 cm MV A velocity: 63.70 cm/s MV E/A ratio:  1.36 Rozann Lesches MD Electronically signed by Rozann Lesches MD Signature Date/Time: 09/08/2021/5:26:36 PM    Final     Labs:  CBC: Recent Labs    09/07/21 0125 09/08/21 0530 09/09/21 0336 09/10/21 0229  WBC 10.9* 7.5 12.7* 13.1*  HGB 9.6* 9.3* 9.3* 9.3*  HCT 30.6* 29.5* 27.9* 29.1*  PLT 370 380 389 409*    COAGS: Recent Labs    08/24/21 1128 08/28/21 0305 08/29/21 0637 08/30/21 0241  INR 1.6* 1.5* 1.7* 1.5*    BMP: Recent Labs    09/07/21 0125 09/08/21 0530 09/09/21 0336 09/10/21 0229  NA 134* 133* 130* 134*  K 3.9 3.3* 4.1 4.1  CL 98 98 95* 98  CO2 23 23 20* 21*  GLUCOSE 89 105* 226* 118*  BUN 36* 32* 40* 41*  CALCIUM 8.0* 7.8* 7.6* 7.8*  CREATININE 6.39* 5.52* 5.43* 5.04*  GFRNONAA 9* 10*  11* 12*    LIVER FUNCTION TESTS: Recent Labs    08/23/21 1037 08/24/21 0055 08/25/21 0758 08/26/21 1036 08/27/21 0029 09/07/21 0125 09/08/21 0530 09/09/21 0336 09/10/21 0229  BILITOT 0.3 0.4  --  0.8  --   --   --   --   --   AST 17 14*  --  14*  --   --   --   --   --   ALT 10 10  --  9  --   --   --   --   --   ALKPHOS 66 57  --  56  --   --   --   --   --   PROT 5.6* 4.8*  --  5.0*  --   --   --   --   --   ALBUMIN 2.2* 1.9*   < > 1.9*   < > 1.7* 1.6* 1.7* 1.6*   < > = values in this interval not displayed.    Assessment and Plan:  70 y.o. male with recent diagnosis of amyloidosis/plasma cell myeloma.  Patient is known to IR service for random renal bx complicated symptomatic anemia, s/p selected catheterization and embolization of right inferior pole arcuate artery on 9/8, s/p R iliac bone bx on 9/14.   Patient hemodynamically stable, hgb has been back to baseline.  Pt underwent CBI  due to clot burden in urinary bladder after renal bx, which was d/c'd per urology.   RN and pt states  that urine now has a tea color, which is showing improvement.   Further treatment plan per TRH/Oncology/ Urology/ Nephrology/ Cardiology  Appreciate and agree with the plan.  IR to follow remotely.    Electronically Signed: Tera Mater, PA-C 09/10/2021, 2:45 PM   I spent a total of 15 Minutes at the the patient's bedside AND on the patient's hospital floor or unit, greater than 50% of which was counseling/coordinating care for symptomatic anemia after random renal bx   This chart was dictated using voice recognition software.  Despite best efforts to proofread,  errors can occur which can change the documentation meaning.

## 2021-09-10 NOTE — Progress Notes (Signed)
OT Cancellation Note  Patient Details Name: Caleb Simmons MRN: [id number] DOB: October 23, 1951   Cancelled Treatment:    Reason Eval/Treat Not Completed: Other (comment) Per RN, pt about to leave for MRI and also planned chemo injection between 12-1PM. Will follow-up for OT session as schedule permits.  [name] 09/10/2021, 11:49 AM

## 2021-09-10 NOTE — Progress Notes (Signed)
PT Cancellation Note  Patient Details Name: Caleb Simmons MRN: [id number] DOB: 10-08-1951   Cancelled Treatment:    Reason Eval/Treat Not Completed: (P) Patient at procedure or test/unavailable Pt receiving chemotherapy and requests to be seen tomorrow.   Declan Adamson B. Migdalia Dk PT, DPT Acute Rehabilitation Services Pager [phone] Office [phone]    [place] 09/10/2021, 3:01 PM

## 2021-09-10 NOTE — Consult Note (Signed)
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 Follow up cultures from 09/08/21 have been without growth to date. No clear source of infection at present although has had multiple procedures recently. TTE was without vegetation. Bacteremia has cleared quickly with penicillin. No indication for line removal at this point and will treat through. Recommend treatment for total of 10 days from clearance of blood culture with end date of 09/16/21. No further follow up necessary. ID will sign off.  PLAN:  Continue current dose of penicillin.  Treatment end date of 09/16/21 for total of 10 days of treatment.  Continue management of plasma cell myeloma per oncology  Remaining medical care per primary team and nephrology.    Active Problems:   AKI (acute kidney injury) ([place])   Amyloid kidney (HCC)   Light chain (AL) amyloidosis (HCC)   Bacteremia due to group B Streptococcus    acyclovir  200 mg Oral Q12H   Chlorhexidine Gluconate Cloth  6 each Topical Daily   feeding supplement (NEPRO CARB STEADY)  237 mL Oral TID BM   folic acid  2 mg Oral Daily   Gerhardt's butt cream   Topical BID   melatonin  5 mg Oral QHS   nicotine  14 mg Transdermal Q24H   pantoprazole  40 mg Oral BID AC   sodium bicarbonate  1,300 mg Oral TID   tamsulosin  0.4 mg Oral Daily     HPI: Caleb Simmons is a 70 y.o. male with previous medical history of nephrolithiasis admitted with generalized weakness and shortness of breath.   Over the week prior to admission he  had a sore throat, cough, decreased appetite, and change of taste. Covid testing was negative. Chest x-ray was unremarkable. Perfusion lung scan without evidence of PE. CT Renal stone with no evidence of urinary tract calculus or hydronephrosis. Noted to have AKI, hematuria and severe normocytic anemia. Renal biopsy performed with surgical pathology showing AL amyloidosis, light chain composition involving the glomeruli, interstitium, arteries and arterioles. Course complicated by hypotension and near syncopal episode. CT on 08/29/21 showed tiny perinephric hematoma and bleeding into the right renal collecting system including moderate to large sided clot within the urinary bladder and associated mild right-sided pelviectasis and ureterectasis. Underwent right IJ temporary hemodialysis cathter placement and right renal angiogram with selective catheterization and embolization of the right inferior pole arcuate artery. Oncology recommended bone marrow biopsy which revealed plasma cell myeloma. His course was complicated once again with a fever on the 09/06/21 with blood cultures positive for Group B Streptococcus.  Caleb Simmons has been afebrile since 09/06/21 with mildly elevated WBC count of 13.1. Currently on Day 5 of antimicrobial therapy with antibiotics narrowed to penicillin. Blood cultures from 09/07/21 have remained without growth to date. Tolerating the penicillin with no adverse side effects. Currently has a temporary hemodialysis catheter in the right IJ. Heading to MRI upon arrival to see him. Feeling scared about his new cancer diagnosis.  Review of Systems: Review of Systems  Constitutional:  Negative for chills, fever and weight loss.  Respiratory:  Negative for cough, shortness of breath and wheezing.   Cardiovascular:  Negative for chest pain and leg swelling.  Gastrointestinal:  Negative for abdominal pain, constipation, diarrhea, nausea and vomiting.  Skin:  Negative for rash.    History  reviewed. No pertinent past medical history.  Social History   Tobacco Use   Smoking status: Some Days    Packs/day: 0.25    Years: 25.00    Pack years: 6.25    Types: Cigarettes   Smokeless tobacco: Never    History reviewed. No pertinent family history.  No Known Allergies  OBJECTIVE: Blood pressure 131/83, pulse 65, temperature 98 F (36.7 C), temperature source Oral, resp. rate 20, height _0  (1.778 m), weight 71 kg, SpO2 97 %.  Physical Exam Constitutional:      General: He is not in acute distress.    Appearance: He is well-developed.  Cardiovascular:     Rate and Rhythm: Normal rate and regular rhythm.     Heart sounds: Normal heart sounds.  Pulmonary:     Effort: Pulmonary effort is normal.     Breath sounds: Normal breath sounds.  Skin:    General: Skin is warm and dry.  Neurological:     Mental Status: He is alert and oriented to person, place, and time.  Psychiatric:        Behavior: Behavior normal.        Thought Content: Thought content normal.        Judgment: Judgment normal.    Lab Results Lab Results  Component Value Date   WBC 13.1 (H) 09/10/2021   HGB 9.3 (L) 09/10/2021   HCT 29.1 (L) 09/10/2021   MCV 84.3 09/10/2021   PLT 409 (H) 09/10/2021    Lab Results  Component Value Date   CREATININE 5.04 (H) 09/10/2021   BUN 41 (H) 09/10/2021   NA 134 (L) 09/10/2021   K 4.1 09/10/2021   CL 98 09/10/2021   CO2 21 (L) 09/10/2021    Lab Results  Component Value Date   ALT 9 08/26/2021   AST 14 (L) 08/26/2021   ALKPHOS 56 08/26/2021   BILITOT 0.8 08/26/2021     Microbiology: Recent Results (from the past 240 hour(s))  Urine Culture     Status: Abnormal   Collection Time: 09/06/21 12:29 PM   Specimen: Urine, Clean Catch  Result Value Ref Range Status   Specimen Description URINE, CLEAN CATCH  Final   Special Requests NONE  Final   Culture (A)  Final    >=100,000 COLONIES/mL GROUP B STREP(S.AGALACTIAE)ISOLATED TESTING AGAINST S.  AGALACTIAE NOT ROUTINELY PERFORMED DUE TO PREDICTABILITY OF AMP/PEN/VAN SUSCEPTIBILITY. Performed at [facility], [place] [address], [place], [place] [address]    Report Status 09/07/2021 FINAL  Final  Culture, blood (routine x 2)     Status: Abnormal   Collection Time: 09/06/21 12:34 PM   Specimen: BLOOD RIGHT HAND  Result Value Ref Range Status   Specimen Description BLOOD RIGHT HAND  Final   Special Requests   Final    BOTTLES DRAWN AEROBIC AND ANAEROBIC Blood Culture adequate volume   Culture  Setup Time   Final    GRAM POSITIVE COCCI IN CHAINS IN BOTH AEROBIC AND ANAEROBIC BOTTLES CRITICAL RESULT CALLED TO, READ BACK BY AND VERIFIED WITH: V BRYK,[email or web address]  09/07/21 [place] Performed at [facility], [place]  [address] [place], [place] [address]    Culture STREPTOCOCCUS AGALACTIAE (A)  Final   Report Status 09/09/2021 FINAL  Final   Organism ID, Bacteria STREPTOCOCCUS AGALACTIAE  Final      Susceptibility   Streptococcus agalactiae - MIC*    CLINDAMYCIN >=1 RESISTANT Resistant     ERYTHROMYCIN >=8 RESISTANT Resistant     VANCOMYCIN 0.5 SENSITIVE Sensitive     CEFTRIAXONE <=0.12 SENSITIVE Sensitive     LEVOFLOXACIN 1 SENSITIVE Sensitive     PENICILLIN Value in next row Sensitive      SENSITIVE<=0.06    * STREPTOCOCCUS AGALACTIAE  Blood Culture ID Panel (Reflexed)     Status: Abnormal   Collection Time: 09/06/21 12:34 PM  Result Value Ref Range Status   Enterococcus faecalis NOT DETECTED NOT DETECTED Final   Enterococcus Faecium NOT DETECTED NOT DETECTED Final   Listeria monocytogenes NOT DETECTED NOT DETECTED Final   Staphylococcus species NOT DETECTED NOT DETECTED Final   Staphylococcus aureus (BCID) NOT DETECTED NOT DETECTED Final   Staphylococcus epidermidis NOT DETECTED NOT DETECTED Final   Staphylococcus lugdunensis NOT DETECTED NOT DETECTED Final   Streptococcus species DETECTED (A) NOT DETECTED Final    Comment: CRITICAL RESULT CALLED TO, READ BACK BY AND  VERIFIED WITH: V BRYK,[email or web address]_[email or web address]  09/07/21 [place]    Streptococcus agalactiae DETECTED (A) NOT DETECTED Final    Comment: CRITICAL RESULT CALLED TO, READ BACK BY AND VERIFIED WITH: V BRYK,[email or web address]_1  09/07/21 [place]    Streptococcus pneumoniae NOT DETECTED NOT DETECTED Final   Streptococcus pyogenes NOT DETECTED NOT DETECTED Final   A.calcoaceticus-baumannii NOT DETECTED NOT DETECTED Final   Bacteroides fragilis NOT DETECTED NOT DETECTED Final   Enterobacterales NOT DETECTED NOT DETECTED Final   Enterobacter cloacae complex NOT DETECTED NOT DETECTED Final   Escherichia coli NOT DETECTED NOT DETECTED Final   Klebsiella aerogenes NOT DETECTED NOT DETECTED Final   Klebsiella oxytoca NOT DETECTED NOT DETECTED Final   Klebsiella pneumoniae NOT DETECTED NOT DETECTED Final   Proteus species NOT DETECTED NOT DETECTED Final   Salmonella species NOT DETECTED NOT DETECTED Final   Serratia marcescens NOT DETECTED NOT DETECTED Final   Haemophilus influenzae NOT DETECTED NOT DETECTED Final   Neisseria meningitidis NOT DETECTED NOT DETECTED Final   Pseudomonas aeruginosa NOT DETECTED NOT DETECTED Final   Stenotrophomonas maltophilia NOT DETECTED NOT DETECTED Final   Candida albicans NOT DETECTED NOT DETECTED Final   Candida auris NOT DETECTED NOT DETECTED Final   Candida glabrata NOT DETECTED NOT DETECTED Final   Candida krusei NOT DETECTED NOT DETECTED Final   Candida parapsilosis NOT DETECTED NOT DETECTED Final   Candida tropicalis NOT DETECTED NOT DETECTED Final   Cryptococcus neoformans/gattii NOT DETECTED NOT DETECTED Final    Comment: Performed at [facility], [place]. [address] [place], [place] [address]  Culture, blood (routine x 2)     Status: Abnormal   Collection Time: 09/06/21 12:46 PM   Specimen: BLOOD RIGHT HAND  Result Value Ref Range Status   Specimen Description BLOOD RIGHT HAND  Final   Special Requests   Final    BOTTLES DRAWN AEROBIC AND ANAEROBIC Blood Culture adequate volume    Culture  Setup Time   Final    GRAM POSITIVE COCCI IN CHAINS IN BOTH AEROBIC AND ANAEROBIC BOTTLES CRITICAL VALUE NOTED.  VALUE IS CONSISTENT WITH PREVIOUSLY REPORTED AND CALLED VALUE.    Culture (A)  Final    STREPTOCOCCUS AGALACTIAE SUSCEPTIBILITIES PERFORMED ON PREVIOUS CULTURE WITHIN THE LAST 5 DAYS.  Performed at [facility], [place] [address], [place], [place] [address]    Report Status 09/09/2021 FINAL  Final  Culture, blood (Routine X 2) w Reflex to ID Panel     Status: None (Preliminary result)   Collection Time: 09/08/21  5:30 AM   Specimen: BLOOD LEFT HAND  Result Value Ref Range Status   Specimen Description BLOOD LEFT HAND  Final   Special Requests AEROBIC BOTTLE ONLY Blood Culture adequate volume  Final   Culture   Final    NO GROWTH 2 DAYS Performed at [facility], [place] [address] [place], [place] [address]    Report Status PENDING  Incomplete  Culture, blood (routine x 2)     Status: None (Preliminary result)   Collection Time: 09/08/21  5:31 AM   Specimen: BLOOD RIGHT HAND  Result Value Ref Range Status   Specimen Description BLOOD RIGHT HAND  Final   Special Requests AEROBIC BOTTLE ONLY Blood Culture adequate volume  Final   Culture   Final    NO GROWTH 2 DAYS Performed at [facility], [place] [address] [place], [place] [address]    Report Status PENDING  Incomplete     Terri Piedra, [place] for Infectious Disease [facility]  09/10/2021  2:57 PM

## 2021-09-11 DIAGNOSIS — E8581 Light chain (AL) amyloidosis: Secondary | ICD-10-CM | POA: Diagnosis not present

## 2021-09-11 DIAGNOSIS — R7881 Bacteremia: Secondary | ICD-10-CM | POA: Diagnosis not present

## 2021-09-11 DIAGNOSIS — E854 Organ-limited amyloidosis: Secondary | ICD-10-CM | POA: Diagnosis not present

## 2021-09-11 DIAGNOSIS — N390 Urinary tract infection, site not specified: Secondary | ICD-10-CM | POA: Diagnosis not present

## 2021-09-11 DIAGNOSIS — I43 Cardiomyopathy in diseases classified elsewhere: Secondary | ICD-10-CM | POA: Diagnosis not present

## 2021-09-11 DIAGNOSIS — N179 Acute kidney failure, unspecified: Secondary | ICD-10-CM | POA: Diagnosis not present

## 2021-09-11 LAB — RENAL FUNCTION PANEL
Albumin: 1.5 g/dL — ABNORMAL LOW (ref 3.5–5.0)
Anion gap: 11 (ref 5–15)
BUN: 39 mg/dL — ABNORMAL HIGH (ref 8–23)
CO2: 22 mmol/L (ref 22–32)
Calcium: 7.5 mg/dL — ABNORMAL LOW (ref 8.9–10.3)
Chloride: 98 mmol/L (ref 98–111)
Creatinine, Ser: 4.9 mg/dL — ABNORMAL HIGH (ref 0.61–1.24)
GFR, Estimated: 12 mL/min — ABNORMAL LOW (ref >60)
Glucose, Bld: 139 mg/dL — ABNORMAL HIGH (ref 70–99)
Phosphorus: 3.3 mg/dL (ref 2.5–4.6)
Potassium: 4.1 mmol/L (ref 3.5–5.1)
Sodium: 131 mmol/L — ABNORMAL LOW (ref 135–145)

## 2021-09-11 LAB — CBC WITH DIFFERENTIAL/PLATELET
Abs Immature Granulocytes: 0.13 10*3/uL — ABNORMAL HIGH (ref 0.00–0.07)
Basophils Absolute: 0.1 10*3/uL (ref 0.0–0.1)
Basophils Relative: 1 %
Eosinophils Absolute: 0.2 10*3/uL (ref 0.0–0.5)
Eosinophils Relative: 2 %
HCT: 28.5 % — ABNORMAL LOW (ref 39.0–52.0)
Hemoglobin: 9 g/dL — ABNORMAL LOW (ref 13.0–17.0)
Immature Granulocytes: 1 %
Lymphocytes Relative: 23 %
Lymphs Abs: 2.4 10*3/uL (ref 0.7–4.0)
MCH: 27.1 pg (ref 26.0–34.0)
MCHC: 31.6 g/dL (ref 30.0–36.0)
MCV: 85.8 fL (ref 80.0–100.0)
Monocytes Absolute: 0.7 10*3/uL (ref 0.1–1.0)
Monocytes Relative: 7 %
Neutro Abs: 7 10*3/uL (ref 1.7–7.7)
Neutrophils Relative %: 66 %
Platelets: 419 10*3/uL — ABNORMAL HIGH (ref 150–400)
RBC: 3.32 MIL/uL — ABNORMAL LOW (ref 4.22–5.81)
RDW: 17.6 % — ABNORMAL HIGH (ref 11.5–15.5)
WBC: 10.6 10*3/uL — ABNORMAL HIGH (ref 4.0–10.5)
nRBC: 0 % (ref 0.0–0.2)

## 2021-09-11 MED ORDER — PROCHLORPERAZINE MALEATE 5 MG PO TABS
5.0000 mg | ORAL_TABLET | Freq: Four times a day (QID) | ORAL | Status: DC | PRN
  Administered 2021-09-12: 5 mg via ORAL
  Filled 2021-09-11 (×2): qty 1

## 2021-09-11 NOTE — Progress Notes (Addendum)
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   Subjective:    Patient overwhelmed by recent diagnosis and is nervous about starting chemo. Quite weak. Lost about 25 lb. Reports he had poor appetite but starting too improve.   No chest pain or shortness of breath reported.   Cardiac MRI 09/10/21 LVEF 38%, normal LV size with moderate LV hypertrophy, small pericardial effusion, elevated T1 readings suggestive of cardiac amyloidosis  Echo 09/08/21 LVEF 45-50%, moderate concentric LVH, grade II DD, RV okay, moderate LAE, small to moderate anterior pericardial effusion, mild MR   Objective:   Weight Range: 71 kg Body mass index is 22.46 kg/m.   Vital Signs:   Temp:  [97.5 F (36.4 C)-98 F (36.7 C)] 97.9 F (36.6 C) (09/20 0450) Pulse Rate:  [65-111] 73 (09/20 0450) Resp:  [16-20] 18 (09/20 0450) BP: (108-131)/(73-87) 131/87 (09/20 0450) SpO2:  [97 %-98 %] 98 % (09/20 0450) Last BM Date: 09/10/21  Weight change: Filed Weights   08/23/21 0816 09/07/21 1431 09/07/21 1615  Weight: 81.6 kg 71.7 kg 71 kg    Intake/Output:   Intake/Output Summary (Last 24 hours) at 09/11/2021 0716 Last data filed at 09/11/2021 0453 Gross per 24 hour  Intake --  Output 2855 ml  Net -2855 ml      Physical Exam    General:  Well appearing. No resp difficulty HEENT: Normal Neck: Supple. No JVD. Caleb Simmons RIJ. Carotids 2+ bilat; no bruits. No lymphadenopathy or thyromegaly appreciated. Cor: PMI nondisplaced. Regular rate & rhythm. 2/6 holosystolic murmur at apex Lungs: Clear Abdomen: Soft, nontender, nondistended. No hepatosplenomegaly. No bruits or masses. Good bowel sounds. Extremities: No cyanosis, clubbing, rash, edema Neuro: Alert &  orientedx3, cranial nerves grossly intact. moves all 4 extremities w/o difficulty. Affect pleasant   Telemetry   NSR 80s (personally reviewed)  EKG    No new for review  Labs    CBC Recent Labs    09/10/21 0229 09/11/21 0251  WBC 13.1* 10.6*  NEUTROABS 9.4* 7.0  HGB 9.3* 9.0*  HCT 29.1* 28.5*  MCV 84.3 85.8  PLT 409* 017*   Basic Metabolic Panel Recent Labs    09/10/21 0229 09/11/21 0251  NA 134* 131*  K 4.1 4.1  CL 98 98  CO2 21* 22  GLUCOSE 118* 139*  BUN 41* 39*  CREATININE 5.04* 4.90*  CALCIUM 7.8* 7.5*  PHOS 2.2* 3.3   Liver Function Tests Recent Labs    09/10/21 0229 09/11/21 0251  ALBUMIN 1.6* 1.5*   No results for input(s): LIPASE, AMYLASE in the last 72 hours. Cardiac Enzymes No results for input(s): CKTOTAL, CKMB, CKMBINDEX, TROPONINI in the last 72 hours.  BNP: BNP (last 3 results) Recent Labs    08/23/21 1901 09/09/21 1514  BNP 1,300.0* 2,567.7*    ProBNP (last 3 results) No results for input(s): PROBNP in the last 8760 hours.   D-Dimer No results for input(s): DDIMER in the last 72 hours. Hemoglobin A1C No results for input(s): HGBA1C in the last 72 hours. Fasting Lipid Panel No results for input(s): CHOL, HDL, LDLCALC, TRIG, CHOLHDL, LDLDIRECT in the last 72 hours. Thyroid Function Tests No results for input(s): TSH, T4TOTAL, T3FREE, THYROIDAB in the last 72 hours.  Invalid input(s): FREET3  Other results:   Imaging    MR CARDIAC MORPHOLOGY WO CONTRAST  Result Date: 09/10/2021 CLINICAL DATA:  Concern for cardiac amyloidosis. EXAM: CARDIAC MRI TECHNIQUE: The patient was scanned on a 1.5 Tesla GE magnet. A dedicated cardiac coil was used. Functional imaging was done using Fiesta sequences. 2,3, and 4 chamber views were done to assess for RWMA's. Modified Simpson's rule using a short axis stack was used to calculate an ejection fraction on a dedicated work Conservation officer, nature. T1 and T2 sequences done. No contrast  due to AKI and unstable renal function. CONTRAST:  None FINDINGS: Limited images of the lung fields showed small bilateral pleural effusions. Small inferior pericardial effusion. Normal left ventricular size with moderate LV hypertrophy. Moderate diffuse hypokinesis with EF 38%. Normal right ventricular size with mild-moderate dysfunction, EF 36%. The aortic valve is trileaflet with no significant stenosis or regurgitation. Normal right atrial size. Mild left atrial enlargement. No significant mitral regurgitation. Measurements: T1 1217 T2 53 LVEDV 134 mL LVSV 51 mL LVEF 38% RVEDV 98 mL RVSV 35 mL RVEF 36% IMPRESSION: 1. Normal LV size with moderate LV hypertrophy. EF 38% with diffuse hypokinesis. 2.  Normal RV size with EF 36%. 3.  T1 elevated, T2 borderline elevated. 4.  Small inferior pericardial effusion. 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 Caleb Simmons Electronically Signed   By: Caleb Simmons M.D.   On: 09/10/2021 17:55     Medications:     Scheduled Medications:  acyclovir  200 mg Oral Q12H   Chlorhexidine Gluconate Cloth  6 each Topical Daily   feeding supplement (NEPRO CARB STEADY)  237 mL Oral TID BM   folic acid  2 mg Oral Daily   Gerhardt's butt cream   Topical BID   melatonin  5 mg Oral QHS   nicotine  14 mg Transdermal Q24H   pantoprazole  40 mg Oral BID AC   sodium bicarbonate  1,300 mg Oral TID   tamsulosin  0.4 mg Oral Daily    Infusions:  sodium chloride     sodium chloride 10 mL/hr at 08/27/21 2147   pencillin G potassium IV 4 Million Units (09/11/21 0209)   sodium chloride irrigation      PRN Medications: acetaminophen, ALPRAZolam, bisacodyl, diphenhydrAMINE, lidocaine, ondansetron **OR** ondansetron (ZOFRAN) IV, oxyCODONE, promethazine, technetium albumin aggregated, traMADol, traZODone      Assessment/Plan   1. 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  Patient presented with weakness/fatigue and was found to have AKI.  -Initiated on chemotherapy this admission with Cytoxan and Velcade per Oncology - Suspect not candidate for autologous hematopoeitic cell transplant with age and renal failure.  2. 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  Echo could be consistent with AL cardiac amyloidosis.  ECG does not show low voltage.  BNP has been elevated and HS-TnI was mildly elevated at admission with no trend, these findings are also common with cardiac amyloidosis though in this patient's case could be due to AKI and volume overload.  AL cardiac amyloidosis generally has a poor prognosis with rapid progression unless treated.   - Cardiac MRI on 09/19 with elevated T1  readings suggestive of cardiac amyloidosis - Diagnosis of AL cardiac amyloidosis at this point will likely make the most difference in terms of prognosis.  Treatment (chemotherapy) will be the same as already planned.  3. Acute systolic CHF: Echo with EF 45-50%. LVEF 38% on cardiac MRI. In setting of suspected AL cardiac amyloidosis as well as AKI.  Volume status currently looks ok.  Will continue to follow without diuretics. Orders placed for daily weights/Is and Os/ 4.  AKI: History of excessive NSAIDs as well as AL amyloidosis renal involvement.  Creatinine appears to have plateaued, peaked at 6.39. Scr 4.90 today.  He had 1 session of HD on 9/16.  Renal following to determine future need.  Has temporary HD catheter. There is hope for recovery.  5.  Group B strep bacteremia: With associated Group B strep UTI.  Currently afebrile, on PCN.  TTE did not show vegetation.  - ID evaluated patient.  Recommended 10 days abx 6. Anemia - Combination of ABLA and anemia of chronic disease (CKD, iron deficiency, folate deficiency) - Received IV iron and folic acid - Multiple units pRBCs since 09/01 - Hgb stable at 9.0 7. Weight loss - Has lost over 25 lb. Appetite now starting to improve.  - Daily weights ordered.   Length of Stay: [place], [place], PA-C  09/11/2021, 7:16 AM  Advanced Heart Failure Team Pager [phone] (M-F; 7a - 5p)  Please contact [facility] for night-coverage after hours (5p -7a ) and weekends on [email or web address]   Patient seen with PA, agree with the above note.   Patient started chemotherapy for AL amyloidosis yesterday.  Creatinine trending down, 4.9 currently.  He walked in the hall.   General: NAD Neck: No JVD, no thyromegaly or thyroid nodule.  Lungs: Clear to auscultation bilaterally with normal respiratory effort. CV: Nondisplaced PMI.  Heart regular S1/S2, no S3/S4, no murmur.  No peripheral edema.   Abdomen: Soft, nontender, no hepatosplenomegaly, no distention.  Skin: Intact without lesions or rashes.  Neurologic: Alert and oriented x 3.  Psych: Normal affect. Extremities: No clubbing or cyanosis.  HEENT: Normal.   Based on cMRI (see above), suspect cardiac involvement by AL amyloidosis.  Unable to give MRI contrast due to unstable renal function (AKI), but T1 elevation and LV morphology suggestive of AL amyloidosis.  EF is low at 38%.  Use of the usual cardiomyopathy meds in cardiac amyloidosis generally is not as beneficial.  He would not be a candidate for SGLT2 inhibitor/ARNI/spironolactone with AKI, and beta blockers are often poorly tolerated in cardiac amyloidosis. Would work on making sure volume controlled by diuretics. Today, he does not look volume overloaded and would not use Lasix.   He has renal involvement by AL amyloidosis, creatinine now trending down and no indication for HD.  Would continue to follow, will need nephrology followup  after discharge.   He tolerated chemotherapy yesterday.   Discussed with ID, does not need TEE.   Should be able to go home soon, will just need close followup of renal function.   Caleb Simmons 09/11/2021 8:26 AM

## 2021-09-11 NOTE — Progress Notes (Signed)
HEMATOLOGY-ONCOLOGY PROGRESS NOTE  SUBJECTIVE: [name] tolerated chemotherapy well overall yesterday.  He reports some mild nausea today.  No vomiting.  Bowels are moving.  Has not yet taken his Zofran.  He also reports hiccups today.  REVIEW OF SYSTEMS:   Constitutional: Denies fevers, chills  Eyes: Denies blurriness of vision Ears, nose, mouth, throat, and face: Denies mucositis or sore throat Respiratory: Denies cough, dyspnea or wheezes Cardiovascular: Denies palpitation, chest discomfort Gastrointestinal: Reports nausea, denies vomiting Skin: Denies abnormal skin rashes Lymphatics: Denies new lymphadenopathy or easy bruising Neurological:Denies numbness, tingling or new weaknesses Behavioral/Psych: Mood is stable, no new changes  Extremities: No lower extremity edema All other systems were reviewed with the patient and are negative.  I have reviewed the past medical history, past surgical history, social history and family history with the patient and they are unchanged from previous note.   PHYSICAL EXAMINATION: ECOG PERFORMANCE STATUS: 2 - Symptomatic, <50% confined to bed  Vitals:   09/11/21 0700 09/11/21 1200  BP: 130/81 138/87  Pulse: 82 80  Resp: 14 16  Temp: 97.8 F (36.6 C) 97.6 F (36.4 C)  SpO2: 98% 98%   Filed Weights   09/07/21 1431 09/07/21 1615 09/11/21 0953  Weight: 71.7 kg 71 kg 75.4 kg    Intake/Output from previous day: 09/19 0701 - 09/20 0700 In: -  Out: 8341 [Urine:2855]  GENERAL:alert, no distress and comfortable SKIN: skin color, texture, turgor are normal, no rashes or significant lesions EYES: normal, Conjunctiva are pink and non-injected, sclera clear OROPHARYNX:no exudate, no erythema and lips, buccal mucosa, and tongue normal  LUNGS: clear to auscultation and percussion with normal breathing effort HEART: regular rate & rhythm and no murmurs and no lower extremity edema ABDOMEN:abdomen soft, non-tender and normal bowel sounds NEURO:  alert & oriented x 3 with fluent speech, no focal motor/sensory deficits  LABORATORY DATA:  I have reviewed the data as listed CMP Latest Ref Rng & Units 09/11/2021 09/10/2021 09/09/2021  Glucose 70 - 99 mg/dL 139(H) 118(H) 226(H)  BUN 8 - 23 mg/dL 39(H) 41(H) 40(H)  Creatinine 0.61 - 1.24 mg/dL 4.90(H) 5.04(H) 5.43(H)  Sodium 135 - 145 mmol/L 131(L) 134(L) 130(L)  Potassium 3.5 - 5.1 mmol/L 4.1 4.1 4.1  Chloride 98 - 111 mmol/L 98 98 95(L)  CO2 22 - 32 mmol/L 22 21(L) 20(L)  Calcium 8.9 - 10.3 mg/dL 7.5(L) 7.8(L) 7.6(L)  Total Protein 6.5 - 8.1 g/dL - - -  Total Bilirubin 0.3 - 1.2 mg/dL - - -  Alkaline Phos 38 - 126 U/L - - -  AST 15 - 41 U/L - - -  ALT 0 - 44 U/L - - -    Lab Results  Component Value Date   WBC 10.6 (H) 09/11/2021   HGB 9.0 (L) 09/11/2021   HCT 28.5 (L) 09/11/2021   MCV 85.8 09/11/2021   PLT 419 (H) 09/11/2021   NEUTROABS 7.0 09/11/2021    CT ABDOMEN PELVIS WO CONTRAST  Result Date: 08/30/2021 CLINICAL DATA:  Retroperitoneal hematoma, follow up s/p random renal bx with perinephric hematoma development EXAM: CT ABDOMEN AND PELVIS WITHOUT CONTRAST TECHNIQUE: Multidetector CT imaging of the abdomen and pelvis was performed following the standard protocol without IV contrast. COMPARISON:  September 6 FINDINGS: Inferior chest: Trace bilateral pleural effusions with right greater than left compressive subsegmental atelectasis. Hepatobiliary: The liver is normal in size without focal abnormality. No intrahepatic or extrahepatic biliary ductal dilation. The gallbladder appears normal. Spleen: Normal in size without focal  abnormality. Pancreas: No pancreatic ductal dilatation or surrounding inflammatory changes. Adrenals/Urinary Tract: Adrenal glands are unremarkable. The kidneys are normal in size. Tiny perinephric hematoma along the right renal lower pole is unchanged. Hyperdense material in the right collecting system and bladder consistent with blood products, grossly  similar. Stomach/Bowel: The stomach, small bowel and large bowel are normal in caliber without abnormal wall thickening or surrounding inflammatory changes. Reproductive: Prostate is unremarkable. Lymphatic: No enlarged lymph nodes in the abdomen or pelvis. Vasculature: The abdominal aorta is normal in caliber. Aortic atherosclerosis. Other: No abdominopelvic ascites. Musculoskeletal: No aggressive osseous lesions. Degenerative changes at L5-S1. Bone island in the left ilium. IMPRESSION: The small perinephric hematoma along the right renal lower pole is stable, and within expected limits after percutaneous biopsy. However, there remains substantial clot burden within the bladder. Follow-up urology recommendations for management. Electronically Signed   By: Albin Felling M.D.   On: 08/30/2021 14:21   CT ABDOMEN PELVIS WO CONTRAST  Result Date: 08/29/2021 CLINICAL DATA:  Post right-sided renal biopsy, now with hematuria and hypotension. EXAM: CT ABDOMEN AND PELVIS WITHOUT CONTRAST TECHNIQUE: Multidetector CT imaging of the abdomen and pelvis was performed following the standard protocol without IV contrast. COMPARISON:  CT abdomen pelvis-08/23/2021; ultrasound-guided right renal biopsy-earlier same day FINDINGS: The lack of intravenous contrast limits the ability to evaluate solid abdominal organs. Lower chest: Limited visualization of the lower thorax demonstrates interval development of trace bilateral effusion with worsening bibasilar heterogeneous/consolidative opacities, right greater than left. Previously identified nonspecific ground-glass opacities within the imaged lung bases is not seen on the present examination though there is mild residual intraseptal thickening. Normal heart size. Trace amount of pericardial fluid, unchanged presumably physiologic. There is diffuse decreased attenuation intra cardiac blood pool suggestive of anemia. Hepatobiliary: Normal hepatic contour. Apparent high density  material within the gallbladder could represent biliary sludge. No definitive gallbladder wall thickening or pericholecystic stranding on this noncontrast examination. No ascites. Pancreas: Normal noncontrast appearance of the pancreas. Spleen: Normal noncontrast appearance of the spleen. Adrenals/Urinary Tract: There is a very tiny (approximately 2.7 x 1.6 x 1.2 cm) perinephric hematoma about the inferior pole of the right kidney (axial image 44, series 3; coronal image 57, series 6), with minimal amount of adjacent perinephric stranding. High-density material is seen within the right renal collecting system and ureter with moderate to large amount of layering high-density material within urinary bladder, findings compatible with hemorrhage into the collecting system and bladder. Mild associated right-sided pelviectasis and ureterectasis. Normal noncontrast appearance of the left kidney. No evidence of left-sided nephrolithiasis or urinary obstruction. Normal noncontrast appearance of the bilateral adrenal glands. Stomach/Bowel: Scattered minimal colonic diverticulosis without evidence of superimposed acute diverticulitis on this noncontrast examination. Normal appearance of the terminal ileum. The appendix is not visualized compatible with provided operative history. No discrete areas of bowel wall thickening on this noncontrast examination. No pneumoperitoneum, pneumatosis or portal venous gas. Vascular/Lymphatic: Moderate amount of atherosclerotic plaque within normal caliber abdominal aorta. Scattered retroperitoneal lymph nodes are numerous though individually not enlarged by size criteria with index left sided periaortic lymph node measuring 0.7 cm in greatest short axis diameter (image 31, series 3), presumably reactive in etiology. No bulky retroperitoneal, mesenteric, pelvic or inguinal lymphadenopathy on this noncontrast examination Reproductive: Dystrophic calcifications within normal sized prostate  gland. Trace amount of fluid within the pelvic cul-de-sac. Other: Small bilateral mesenteric fat containing inguinal hernias, left greater than right. Minimal amount of subcutaneous edema about the midline of the low  back. Presumed shrapnel is seen within the right lower abdominal/pelvic ventral abdominal wall. Musculoskeletal: No acute or aggressive osseous abnormalities. Mild-to-moderate multilevel lumbar spine DDD, worse at L4-L5 and L5-S1 with disc space height loss, endplate irregularity and small posteriorly directed disc osteophyte complexes at these locations. Mild degenerative change the bilateral hips with joint space loss, subchondral sclerosis and osteophytosis, right greater than left. IMPRESSION: 1. Post right-sided renal biopsy complicated by tiny (approximately 2.7 cm) perinephric hematoma and bleeding into the right renal collecting system including moderate to large-sized clot within the urinary bladder and associated mild right-sided pelviectasis and ureterectasis. Consideration for initiation of continuous bladder irrigation could be performed as indicated. 2. Trace bilateral effusions with associated bibasilar opacities, right greater than left, likely atelectasis. 3. Colonic diverticulosis without evidence superimposed acute diverticulitis. 4.  Aortic Atherosclerosis (ICD10-I70.0). Critical Value/emergent results were called by telephone at the time of interpretation on 08/28/2021 at 5:04 pm to provider [facility] , who verbally acknowledged these results. Electronically Signed   By: Sandi Mariscal M.D.   On: 08/29/2021 10:38   DG Chest 2 View  Result Date: 09/06/2021 CLINICAL DATA:  Fever EXAM: CHEST - 2 VIEW COMPARISON:  09/03/2021 FINDINGS: Right IJ dialysis catheter tip med right atrium. Midline trachea. Normal heart size. Left costophrenic angle minimally excluded the frontal radiograph. Small bilateral pleural effusions. No pneumothorax. Improved interstitial edema with minimal  pulmonary venous congestion remaining. Persistent mild bibasilar atelectasis. IMPRESSION: Improved interstitial edema with mild pulmonary venous congestion remaining. Small bilateral pleural effusions with adjacent atelectasis. Electronically Signed   By: Abigail Miyamoto M.D.   On: 09/06/2021 13:49   DG Chest 2 View  Result Date: 08/23/2021 CLINICAL DATA:  Near syncope. EXAM: CHEST - 2 VIEW COMPARISON:  None. FINDINGS: The lungs are clear without focal pneumonia, edema, pneumothorax or pleural effusion. Cardiopericardial silhouette is at upper limits of normal for size. The visualized bony structures of the thorax show no acute abnormality. Telemetry leads overlie the chest. IMPRESSION: No active cardiopulmonary disease. Electronically Signed   By: Misty Stanley M.D.   On: 08/23/2021 09:15   NM Pulmonary Perfusion  Result Date: 08/23/2021 CLINICAL DATA:  PE suspected, shortness of breath EXAM: NUCLEAR MEDICINE PERFUSION LUNG SCAN TECHNIQUE: Perfusion images were obtained in multiple projections after intravenous injection of radiopharmaceutical. Ventilation scans intentionally deferred if perfusion scan and chest x-ray adequate for interpretation during COVID 19 epidemic. RADIOPHARMACEUTICALS:  4.2 mCi Tc-63mMAA IV COMPARISON:  Same-day chest radiographs FINDINGS: Normal, homogeneous pulmonary perfusion. No suspicious filling defects. IMPRESSION: Very low probability examination for pulmonary embolism by modified perfusion only PIOPED criteria (PE absent). Electronically Signed   By: AEddie CandleM.D.   On: 08/23/2021 15:59   IR Angiogram Renal Left Selective  INDICATION: 70year old male with history of acute kidney injury of uncertain etiology status post ultrasound-guided right renal biopsy on 08/28/2021. Since biopsy, the patient has experienced gross hematuria with associated acute anemia.   EXAM: 1. Ultrasound-guided vascular access of the right internal jugular vein. 2. Temporary hemodialysis catheter  placement. 3. Ultrasound-guided vascular access of the right common femoral artery. 4. Selective catheterization and angiography of the right renal artery. 5. Sub selective catheterization angiography of right inferior polar and arcuate artery branches. 6. Coil embolization of right inferior pole arcuate artery branch.   MEDICATIONS: None.   ANESTHESIA/SEDATION: Moderate (conscious) sedation was employed during this procedure. A total of Versed 3 mg and Fentanyl 50 mcg was administered intravenously.   Moderate Sedation Time: 66 minutes.  The patient's level of consciousness and vital signs were monitored continuously by radiology nursing throughout the procedure under my direct supervision.   CONTRAST:  13m OMNIPAQUE IOHEXOL 350 MG/ML SOLN, 530mOMNIPAQUE IOHEXOL 350 MG/ML SOLN   FLUOROSCOPY TIME:  Fluoroscopy Time: 10.6 minutes, (811 mGy).   COMPLICATIONS: None immediate.   PROCEDURE: Informed consent was obtained from the patient following explanation of the procedure, risks, benefits and alternatives. The patient understands, agrees and consents for the procedure. All questions were addressed. A time out was performed prior to the initiation of the procedure. Maximal barrier sterile technique utilized including caps, mask, sterile gowns, sterile gloves, large sterile drape, hand hygiene, and chlorhexidine prep.   Preprocedure ultrasound evaluation of the right internal jugular vein demonstrated a patent and compressible vein free of internal echoes. Procedure was planned. Subdermal Local anesthesia was provided 1% lidocaine. A small skin nick was made. Under direct ultrasound visualization, a 21 gauge micropuncture needle was directed into the internal jugular vein. An image was captured and stored in the permanent record. A micropuncture set was inserted and exchanged for a J wire which was positioned in the inferior vena cava. Serial dilation was performed followed by placement of a 12.5 French, 24 cm  Trialysis catheter. The catheter tip was positioned in the right atrium. Each lumen flushed and aspirated appropriately. The dialysis ports were locked with appropriate volume of heparin dwell. The middle central venous port was then used for sedation for the remainder of the procedure. The catheter was secured with a 0 silk retention suture. A sterile bandage was applied.   Preprocedure ultrasound evaluation of the right groin was performed which demonstrated a patent right common femoral artery. The procedure was planned. Subdermal Local anesthesia was provided with 1% lidocaine. A small skin nick was made. Under direct ultrasound visualization, a 21 gauge micropuncture needle was directed into the common femoral artery. An ultrasound image was captured and stored in the permanent record. A micro puncture set was inserted and a limited right lower extremity angiogram was performed which demonstrated appropriate puncture site for closure device use. A J wire was directed to the abdominal aorta and the micropuncture set was exchanged for a 5 Fr[place] sheath. A 5 French C2 catheter was then directed into the right renal ostium. Right renal angiogram was performed. The single main renal artery was patent. 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   A straight lantern microcatheter and 0.014" soft synchro wire was then inserted and directed into the inferior polar branch. Repeat angiogram was performed in the sub selective location which was significant for multifocal 2-4 mm pseudoaneurysm formation, abnormal truncation and irregularity of the arcuate and interlobular branches, an early arteriovenous shunting. The inferior polar arcuate branch was selected further. Coil embolization was performed with  multiple low profile Penumbra Ruby coils ranging from 2-3 mm in diameter. Completion right inferior renal angiogram was performed which demonstrated appropriate embolization of the targeted vessels without persistent arteriovenous fistula or vessel irregularity.   The catheters were removed. The right common femoral artery was then closed with a 6 FrPakistanngio-Seal device. Distal pulses were unchanged. The patient tolerated the procedure well was transferred back to the floor in good condition.   IMPRESSION: 1. 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 2. Sub selective coil embolization of right inferior polar arcuate artery branch. 3. Successful placement of right internal jugular, 24 French Trialysis catheter with the catheter tip in the right atrium. The catheter is ready for immediate use.   Ruthann Cancer, MD   [facility]   [facility] Radiology     Electronically Signed   By: Ruthann Cancer M.D.   On: 08/31/2021 09:29    IR Fluoro Guide CV Line Right  Result Date: 08/31/2021 INDICATION: 70 year old male with history of acute kidney injury of uncertain etiology status post ultrasound-guided right renal biopsy on 08/28/2021. Since biopsy, the patient has experienced gross hematuria with associated acute anemia. EXAM: 1. Ultrasound-guided vascular access of the right internal jugular vein. 2. Temporary hemodialysis catheter placement. 3. Ultrasound-guided vascular access of the right common femoral artery. 4. Selective catheterization and angiography of the right renal artery. 5. Sub selective catheterization angiography of right inferior polar and arcuate artery branches. 6. Coil embolization of right inferior pole arcuate artery branch. MEDICATIONS: None. ANESTHESIA/SEDATION: Moderate (conscious) sedation was employed during  this procedure. A total of Versed 3 mg and Fentanyl 50 mcg was administered intravenously. Moderate Sedation Time: 66 minutes. The patient's level of consciousness and vital signs were monitored continuously by radiology nursing throughout the procedure under my direct supervision. CONTRAST:  48m OMNIPAQUE IOHEXOL 350 MG/ML SOLN, 571mOMNIPAQUE IOHEXOL 350 MG/ML SOLN FLUOROSCOPY TIME:  Fluoroscopy Time: 10.6 minutes, (811 mGy). COMPLICATIONS: None immediate. PROCEDURE: Informed consent was obtained from the patient following explanation of the procedure, risks, benefits and alternatives. The patient understands, agrees and consents for the procedure. All questions were addressed. A time out was performed prior to the initiation of the procedure. Maximal barrier sterile technique utilized including caps, mask, sterile gowns, sterile gloves, large sterile drape, hand hygiene, and chlorhexidine prep. Preprocedure ultrasound evaluation of the right internal jugular vein demonstrated a patent and compressible vein free of internal echoes. Procedure was planned. Subdermal Local anesthesia was provided 1% lidocaine. A small skin nick was made. Under direct ultrasound visualization, a 21 gauge micropuncture needle was directed into the internal jugular vein. An image was captured and stored in the permanent record. A micropuncture set was inserted and exchanged for a J wire which was positioned in the inferior vena cava. Serial dilation was performed followed by placement of a 12.5 French, 24 cm Trialysis catheter. The catheter tip was positioned in the right atrium. Each lumen flushed and aspirated appropriately. The dialysis ports were locked with appropriate volume of heparin dwell. The middle central venous port was then used for sedation for the remainder of the procedure. The catheter was secured with a 0 silk retention suture. A sterile bandage was applied. Preprocedure ultrasound evaluation of the right groin was  performed which demonstrated a patent right common femoral artery. The procedure was planned. Subdermal Local anesthesia was provided with 1% lidocaine. A small skin nick was made. Under direct ultrasound visualization, a 21 gauge micropuncture needle was directed into the common femoral artery. An ultrasound image was captured and stored in the permanent record. A micro puncture set was inserted and a limited right lower extremity angiogram was performed which demonstrated appropriate puncture site for closure device use. A J wire was directed to the abdominal aorta and the micropuncture set was exchanged for a 5 Fr[place]ascular sheath. A 5 French C2 catheter was then directed into the right renal  ostium. Right renal angiogram was performed. The single main renal artery was patent. 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 A straight lantern microcatheter and 0.014" soft synchro wire was then inserted and directed into the inferior polar branch. Repeat angiogram was performed in the sub selective location which was significant for multifocal 2-4 mm pseudoaneurysm formation, abnormal truncation and irregularity of the arcuate and interlobular branches, an early arteriovenous shunting. The inferior polar arcuate branch was selected further. Coil embolization was performed with multiple low profile Penumbra Ruby coils ranging from 2-3 mm in diameter. Completion right inferior renal angiogram was performed which demonstrated appropriate embolization of the targeted vessels without persistent arteriovenous fistula or vessel irregularity. The catheters were removed. The right common femoral artery was then closed with a 6 [place] Angio-Seal device. Distal pulses were unchanged. The patient tolerated the procedure  well was transferred back to the floor in good condition. IMPRESSION: 1. 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 2. Sub selective coil embolization of right inferior polar arcuate artery branch. 3. Successful placement of right internal jugular, 24 French Trialysis catheter with the catheter tip in the right atrium. The catheter is ready for immediate use. Ruthann Cancer, MD [facility] Radiology Electronically Signed   By: Ruthann Cancer M.D.   On: 08/31/2021 09:29   IR US Guide Vasc Access Right  Result Date: 08/31/2021 INDICATION: 70 year old male with history of acute kidney injury of uncertain etiology status post ultrasound-guided right renal biopsy on 08/28/2021. Since biopsy, the patient has experienced gross hematuria with associated acute anemia. EXAM: 1. Ultrasound-guided vascular access of the right internal jugular vein. 2. Temporary hemodialysis catheter placement. 3. Ultrasound-guided vascular access of the right common femoral artery. 4. Selective catheterization and angiography of the right renal artery. 5. Sub selective catheterization angiography of right inferior polar and arcuate artery branches. 6. Coil embolization of right inferior pole arcuate artery branch. MEDICATIONS: None. ANESTHESIA/SEDATION: Moderate (conscious) sedation was employed during this procedure. A total of Versed 3 mg and Fentanyl 50 mcg was administered intravenously. Moderate Sedation Time: 66 minutes. The patient's level of consciousness and vital signs were monitored continuously by radiology nursing throughout the procedure under my direct supervision. CONTRAST:  42m OMNIPAQUE IOHEXOL 350 MG/ML SOLN, 564mOMNIPAQUE IOHEXOL 350 MG/ML SOLN FLUOROSCOPY TIME:  Fluoroscopy Time: 10.6 minutes, (811 mGy). COMPLICATIONS: None  immediate. PROCEDURE: Informed consent was obtained from the patient following explanation of the procedure, risks, benefits and alternatives. The patient understands, agrees and consents for the procedure. All questions were addressed. A time out was performed prior to the initiation of the procedure. Maximal barrier sterile technique utilized including caps, mask, sterile gowns, sterile gloves, large sterile drape, hand hygiene, and chlorhexidine prep. Preprocedure ultrasound evaluation of the right internal jugular vein demonstrated a patent and compressible vein free of internal echoes. Procedure was planned. Subdermal Local anesthesia was provided 1% lidocaine. A small skin nick was made. Under direct ultrasound visualization, a 21 gauge micropuncture needle was directed into the internal jugular vein. An image was captured and stored in the permanent record. A micropuncture set was inserted and exchanged for a J wire which was positioned in the inferior vena cava. Serial dilation  was performed followed by placement of a 12.5 [place], 24 cm Trialysis catheter. The catheter tip was positioned in the right atrium. Each lumen flushed and aspirated appropriately. The dialysis ports were locked with appropriate volume of heparin dwell. The middle central venous port was then used for sedation for the remainder of the procedure. The catheter was secured with a 0 silk retention suture. A sterile bandage was applied. Preprocedure ultrasound evaluation of the right groin was performed which demonstrated a patent right common femoral artery. The procedure was planned. Subdermal Local anesthesia was provided with 1% lidocaine. A small skin nick was made. Under direct ultrasound visualization, a 21 gauge micropuncture needle was directed into the common femoral artery. An ultrasound image was captured and stored in the permanent record. A micro puncture set was inserted and a limited right lower extremity angiogram was  performed which demonstrated appropriate puncture site for closure device use. A J wire was directed to the abdominal aorta and the micropuncture set was exchanged for a 5 [place] vascular sheath. A 5 French C2 catheter was then directed into the right renal ostium. Right renal angiogram was performed. The single main renal artery was patent. 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 A straight lantern microcatheter and 0.014" soft synchro wire was then inserted and directed into the inferior polar branch. Repeat angiogram was performed in the sub selective location which was significant for multifocal 2-4 mm pseudoaneurysm formation, abnormal truncation and irregularity of the arcuate and interlobular branches, an early arteriovenous shunting. The inferior polar arcuate branch was selected further. Coil embolization was performed with multiple low profile Penumbra Ruby coils ranging from 2-3 mm in diameter. Completion right inferior renal angiogram was performed which demonstrated appropriate embolization of the targeted vessels without persistent arteriovenous fistula or vessel irregularity. The catheters were removed. The right common femoral artery was then closed with a 6 [place] Angio-Seal device. Distal pulses were unchanged. The patient tolerated the procedure well was transferred back to the floor in good condition. IMPRESSION: 1. 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 2. Sub selective coil embolization of right inferior polar arcuate artery branch. 3. Successful placement of right internal jugular, 24 French Trialysis catheter with the catheter tip in  the right atrium. The catheter is ready for immediate use. Ruthann Cancer, MD [facility] Radiology Electronically Signed   By: Ruthann Cancer M.D.   On: 08/31/2021 09:29   IR US Guide Vasc Access Right  Result Date: 08/31/2021 INDICATION: 70 year old male with history of acute kidney injury of uncertain etiology status post ultrasound-guided right renal biopsy on 08/28/2021. Since biopsy, the patient has experienced gross hematuria with associated acute anemia. EXAM: 1. Ultrasound-guided vascular access of the right internal jugular vein. 2. Temporary hemodialysis catheter placement. 3. Ultrasound-guided vascular access of the right common femoral artery. 4. Selective catheterization and angiography of the right renal artery. 5. Sub selective catheterization angiography of right inferior polar and arcuate artery branches. 6. Coil embolization of right inferior pole arcuate artery branch. MEDICATIONS: None. ANESTHESIA/SEDATION: Moderate (conscious) sedation was employed during this procedure. A total of Versed 3 mg and Fentanyl 50 mcg was administered intravenously. Moderate Sedation Time: 66 minutes. The patient's  level of consciousness and vital signs were monitored continuously by radiology nursing throughout the procedure under my direct supervision. CONTRAST:  14m OMNIPAQUE IOHEXOL 350 MG/ML SOLN, 548mOMNIPAQUE IOHEXOL 350 MG/ML SOLN FLUOROSCOPY TIME:  Fluoroscopy Time: 10.6 minutes, (811 mGy). COMPLICATIONS: None immediate. PROCEDURE: Informed consent was obtained from the patient following explanation of the procedure, risks, benefits and alternatives. The patient understands, agrees and consents for the procedure. All questions were addressed. A time out was performed prior to the initiation of the procedure. Maximal barrier sterile technique utilized including caps, mask, sterile gowns, sterile gloves, large sterile drape, hand hygiene, and chlorhexidine  prep. Preprocedure ultrasound evaluation of the right internal jugular vein demonstrated a patent and compressible vein free of internal echoes. Procedure was planned. Subdermal Local anesthesia was provided 1% lidocaine. A small skin nick was made. Under direct ultrasound visualization, a 21 gauge micropuncture needle was directed into the internal jugular vein. An image was captured and stored in the permanent record. A micropuncture set was inserted and exchanged for a J wire which was positioned in the inferior vena cava. Serial dilation was performed followed by placement of a 12.5 French, 24 cm Trialysis catheter. The catheter tip was positioned in the right atrium. Each lumen flushed and aspirated appropriately. The dialysis ports were locked with appropriate volume of heparin dwell. The middle central venous port was then used for sedation for the remainder of the procedure. The catheter was secured with a 0 silk retention suture. A sterile bandage was applied. Preprocedure ultrasound evaluation of the right groin was performed which demonstrated a patent right common femoral artery. The procedure was planned. Subdermal Local anesthesia was provided with 1% lidocaine. A small skin nick was made. Under direct ultrasound visualization, a 21 gauge micropuncture needle was directed into the common femoral artery. An ultrasound image was captured and stored in the permanent record. A micro puncture set was inserted and a limited right lower extremity angiogram was performed which demonstrated appropriate puncture site for closure device use. A J wire was directed to the abdominal aorta and the micropuncture set was exchanged for a 5 [place] sheath. A 5 French C2 catheter was then directed into the right renal ostium. Right renal angiogram was performed. The single main renal artery was patent. About 2 arcuate artery branches in the inferior pole there is abnormal truncation and vessel irregularity with  evidence of an early filling arteriovenous fistula in addition to suggestion of faint filling into the collecting system on delayed imaging. A straight lantern microcatheter and 0.014" soft synchro wire was then inserted and directed into the inferior polar branch. Repeat angiogram was performed in the sub selective location which was significant for multifocal 2-4 mm pseudoaneurysm formation, abnormal truncation and irregularity of the arcuate and interlobular branches, an early arteriovenous shunting. The inferior polar arcuate branch was selected further. Coil embolization was performed with multiple low profile Penumbra Ruby coils ranging from 2-3 mm in diameter. Completion right inferior renal angiogram was performed which demonstrated appropriate embolization of the targeted vessels without persistent arteriovenous fistula or vessel irregularity. The catheters were removed. The right common femoral artery was then closed with a 6 Fr[place]-Seal device. Distal pulses were unchanged. The patient tolerated the procedure well was transferred back to the floor in good condition. IMPRESSION: 1. 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 2. Sub selective coil embolization of right  inferior polar arcuate artery branch. 3. Successful placement of right internal jugular, 24 French Trialysis catheter with the catheter tip in the right atrium. The catheter is ready for immediate use. Ruthann Cancer, MD [facility] Radiology Electronically Signed   By: Ruthann Cancer M.D.   On: 08/31/2021 09:29   DG Chest Port 1 View  Result Date: 09/03/2021 CLINICAL DATA:  Shortness of breath EXAM: PORTABLE CHEST 1 VIEW COMPARISON:  08/23/2021 FINDINGS: Right dialysis catheter in place with the tip in the right atrium. Heart is normal size.  Mild vascular congestion and interstitial prominence throughout the lungs, likely mild edema. No effusions or acute bony abnormality. IMPRESSION: Suspect mild pulmonary edema. Electronically Signed   By: Rolm Baptise M.D.   On: 09/03/2021 09:40   DG Bone Survey Met  Result Date: 09/08/2021 CLINICAL DATA:  Left hip pain. EXAM: METASTATIC BONE SURVEY COMPARISON:  None. FINDINGS: No metastatic lesions identified. No cause for left hip pain identified. IMPRESSION: No acute abnormalities identified. No metastatic lesions. No cause for hip pain noted. Electronically Signed   By: Dorise Bullion III M.D.   On: 09/08/2021 14:32   MR CARDIAC MORPHOLOGY WO CONTRAST  Result Date: 09/10/2021 CLINICAL DATA:  Concern for cardiac amyloidosis. EXAM: CARDIAC MRI TECHNIQUE: The patient was scanned on a 1.5 Tesla GE magnet. A dedicated cardiac coil was used. Functional imaging was done using Fiesta sequences. 2,3, and 4 chamber views were done to assess for RWMA's. Modified Simpson's rule using a short axis stack was used to calculate an ejection fraction on a dedicated work Conservation officer, nature. T1 and T2 sequences done. No contrast due to AKI and unstable renal function. CONTRAST:  None FINDINGS: Limited images of the lung fields showed small bilateral pleural effusions. Small inferior pericardial effusion. Normal left ventricular size with moderate LV hypertrophy. Moderate diffuse hypokinesis with EF 38%. Normal right ventricular size with mild-moderate dysfunction, EF 36%. The aortic valve is trileaflet with no significant stenosis or regurgitation. Normal right atrial size. Mild left atrial enlargement. No significant mitral regurgitation. Measurements: T1 1217 T2 53 LVEDV 134 mL LVSV 51 mL LVEF 38% RVEDV 98 mL RVSV 35 mL RVEF 36% IMPRESSION: 1. Normal LV size with moderate LV hypertrophy. EF 38% with diffuse hypokinesis. 2.  Normal RV size with EF 36%. 3.  T1 elevated, T2 borderline elevated. 4.  Small inferior  pericardial effusion. 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 Dalton Mclean Electronically Signed   By: Loralie Champagne M.D.   On: 09/10/2021 17:55   CT BONE MARROW BIOPSY & ASPIRATION  Result Date: 09/05/2021 CLINICAL DATA:  Amyloid kidney EXAM: CT GUIDED DEEP ILIAC BONE ASPIRATION AND CORE BIOPSY TECHNIQUE: Patient was placed prone on the CT gantry and limited axial scans through the pelvis were obtained. Appropriate skin entry site was identified. Skin site was marked, prepped with chlorhexidine, draped in usual sterile fashion, and infiltrated locally with 1% lidocaine. Intravenous Fentanyl 27mg and Versed 255mwere administered as conscious sedation during continuous monitoring of the patient's level of consciousness and physiological / cardiorespiratory status by the radiology RN, with a total moderate sedation time of 10 minutes. Under CT fluoroscopic guidance an 11-gauge Cook trocar bone needle was advanced into the right iliac bone just lateral to the sacroiliac joint. Once needle tip position was confirmed, core and aspiration samples were obtained, submitted to  pathology for approval. Post procedure scans show no hematoma or fracture. Patient tolerated procedure well. COMPLICATIONS: COMPLICATIONS none IMPRESSION: 1. Technically successful CT guided right iliac bone core and aspiration biopsy. Electronically Signed   By: Lucrezia Europe M.D.   On: 09/05/2021 10:48   ECHOCARDIOGRAM COMPLETE  Result Date: 09/08/2021    ECHOCARDIOGRAM REPORT   Patient Name:   SIGISMUND CROSS Date of Exam: 09/08/2021 Medical Rec #:  [id number]    Height:       70.0 in Accession #:    [id number]   Weight:       156.5 lb Date of Birth:  [birth year]     BSA:          1.881 m Patient Age:    21 years     BP:           130/78 mmHg Patient Gender: M            HR:           75 bpm. Exam Location:   Inpatient Procedure: 2D Echo, Cardiac Doppler and Color Doppler Indications:    Bacteremia R78.81  History:        Patient has prior history of Echocardiogram examinations, most                 recent 08/24/2021. Risk Factors:Current Smoker. Generalized                 weakness/shortness of breath-found to have AKI and severe                 normocytic anemia.  Sonographer:    Alvino Chapel RCS Referring Phys: [id number] [place]  1. Left ventricular ejection fraction, by estimation, is 45 to 50%. The left ventricle has mildly decreased function. The left ventricle demonstrates global hypokinesis. There is moderate concentric left ventricular hypertrophy. Left ventricular diastolic parameters are consistent with Grade II diastolic dysfunction (pseudonormalization).  2. Right ventricular systolic function is normal. The right ventricular size is normal. Tricuspid regurgitation signal is inadequate for assessing PA pressure.  3. Left atrial size was moderately dilated.  4. A small to moderate pericardial effusion is present. The pericardial effusion is anterior to the right ventricle. There is no evidence of cardiac tamponade.  5. The mitral valve is grossly normal. Mild mitral valve regurgitation.  6. The aortic valve is tricuspid. There is mild thickening of the aortic valve. Aortic valve regurgitation is not visualized.  7. The inferior vena cava is normal in size with greater than 50% respiratory variability, suggesting right atrial pressure of 3 mmHg.  8. No obvious valvular vegetations. Comparison(s): Prior images reviewed side by side. LVEF mildly reduced and pericardial effusion is new. FINDINGS  Left Ventricle: Left ventricular ejection fraction, by estimation, is 45 to 50%. The left ventricle has mildly decreased function. The left ventricle demonstrates global hypokinesis. The left ventricular internal cavity size was normal in size. There is  moderate concentric left ventricular  hypertrophy. Left ventricular diastolic parameters are consistent with Grade II diastolic dysfunction (pseudonormalization). Right Ventricle: The right ventricular size is normal. No increase in right ventricular wall thickness. Right ventricular systolic function is normal. Tricuspid regurgitation signal is inadequate for assessing PA pressure. Left Atrium: Left atrial size was moderately dilated. Right Atrium: Right atrial size was normal in size. Pericardium: A small pericardial effusion is present. The pericardial effusion is anterior to the right ventricle. There is no evidence of cardiac tamponade. Mitral Valve:  The mitral valve is grossly normal. There is mild thickening of the mitral valve leaflet(s). Mild mitral annular calcification. Mild mitral valve regurgitation. Tricuspid Valve: The tricuspid valve is grossly normal. Tricuspid valve regurgitation is trivial. Aortic Valve: The aortic valve is tricuspid. There is mild thickening of the aortic valve. There is mild to moderate aortic valve annular calcification. Aortic valve regurgitation is not visualized. Pulmonic Valve: The pulmonic valve was grossly normal. Pulmonic valve regurgitation is trivial. Aorta: The aortic root is normal in size and structure. Venous: The inferior vena cava is normal in size with greater than 50% respiratory variability, suggesting right atrial pressure of 3 mmHg. IAS/Shunts: No atrial level shunt detected by color flow Doppler.  LEFT VENTRICLE PLAX 2D LVIDd:         5.10 cm  Diastology LVIDs:         3.90 cm  LV e' medial:    3.77 cm/s LV PW:         1.50 cm  LV E/e' medial:  22.9 LV IVS:        1.40 cm  LV e' lateral:   7.18 cm/s LVOT diam:     2.00 cm  LV E/e' lateral: 12.0 LV SV:         51 LV SV Index:   27 LVOT Area:     3.14 cm  RIGHT VENTRICLE RV S prime:     14.40 cm/s TAPSE (M-mode): 1.9 cm LEFT ATRIUM              Index       RIGHT ATRIUM           Index LA diam:        4.20 cm  2.23 cm/m  RA Area:     14.50 cm  LA Vol (A2C):   104.0 ml 55.29 ml/m RA Volume:   36.00 ml  19.14 ml/m LA Vol (A4C):   64.4 ml  34.24 ml/m LA Biplane Vol: 83.7 ml  44.50 ml/m  AORTIC VALVE LVOT Vmax:   89.00 cm/s LVOT Vmean:  53.600 cm/s LVOT VTI:    0.161 m  AORTA Ao Root diam: 3.50 cm MITRAL VALVE MV Area (PHT): 3.65 cm    SHUNTS MV Decel Time: 208 msec    Systemic VTI:  0.16 m MV E velocity: 86.40 cm/s  Systemic Diam: 2.00 cm MV A velocity: 63.70 cm/s MV E/A ratio:  1.36 Rozann Lesches MD Electronically signed by Rozann Lesches MD Signature Date/Time: 09/08/2021/5:26:36 PM    Final    ECHOCARDIOGRAM COMPLETE  Result Date: 08/24/2021    ECHOCARDIOGRAM REPORT   Patient Name:   REGGIE BISE Date of Exam: 08/24/2021 Medical Rec #:  [id number]    Height:       70.0 in Accession #:    [id number]   Weight:       180.0 lb Date of Birth:  [birth year]     BSA:          1.996 m Patient Age:    13 years     BP:           120/69 mmHg Patient Gender: M            HR:           85 bpm. Exam Location:  Inpatient Procedure: 2D Echo, Cardiac Doppler and Color Doppler Indications:    Abnormal EKG  History:        Patient has no prior history of  Echocardiogram examinations.                 COPD, Signs/Symptoms:Dyspnea and Weakness, renal disease,                 anemia, elevated troponin; Risk Factors:Current Smoker.  Sonographer:    Dustin Flock RDCS Referring Phys: [place]  Sonographer Comments: Image acquisition challenging due to COPD. IMPRESSIONS  1. Left ventricular ejection fraction, by estimation, is 55 to 60%. The left ventricle has normal function. The left ventricle has no regional wall motion abnormalities. There is mild concentric left ventricular hypertrophy. Left ventricular diastolic parameters are consistent with Grade I diastolic dysfunction (impaired relaxation).  2. Right ventricular systolic function is normal. The right ventricular size is normal. Tricuspid regurgitation signal is inadequate for assessing PA pressure.   3. The mitral valve is grossly normal. No evidence of mitral valve regurgitation. No evidence of mitral stenosis.  4. The aortic valve was not well visualized. Aortic valve regurgitation is not visualized. No aortic stenosis is present.  5. The inferior vena cava is normal in size with greater than 50% respiratory variability, suggesting right atrial pressure of 3 mmHg. Comparison(s): No prior Echocardiogram. FINDINGS  Left Ventricle: Left ventricular ejection fraction, by estimation, is 55 to 60%. The left ventricle has normal function. The left ventricle has no regional wall motion abnormalities. The left ventricular internal cavity size was normal in size. There is  mild concentric left ventricular hypertrophy. Left ventricular diastolic parameters are consistent with Grade I diastolic dysfunction (impaired relaxation). Right Ventricle: The right ventricular size is normal. No increase in right ventricular wall thickness. Right ventricular systolic function is normal. Tricuspid regurgitation signal is inadequate for assessing PA pressure. Left Atrium: Left atrial size was normal in size. Right Atrium: Right atrial size was normal in size. Pericardium: There is no evidence of pericardial effusion. Mitral Valve: The mitral valve is grossly normal. No evidence of mitral valve regurgitation. No evidence of mitral valve stenosis. Tricuspid Valve: The tricuspid valve is normal in structure. Tricuspid valve regurgitation is not demonstrated. No evidence of tricuspid stenosis. Aortic Valve: The aortic valve was not well visualized. Aortic valve regurgitation is not visualized. No aortic stenosis is present. Pulmonic Valve: The pulmonic valve was not well visualized. Pulmonic valve regurgitation is not visualized. Aorta: The aortic root is normal in size and structure. Venous: The inferior vena cava is normal in size with greater than 50% respiratory variability, suggesting right atrial pressure of 3 mmHg. IAS/Shunts: The  atrial septum is grossly normal.  LEFT VENTRICLE PLAX 2D LVIDd:         5.80 cm      Diastology LVIDs:         3.60 cm      LV e' medial:    5.55 cm/s LV PW:         1.30 cm      LV E/e' medial:  14.5 LV IVS:        1.30 cm      LV e' lateral:   6.74 cm/s LVOT diam:     2.40 cm      LV E/e' lateral: 12.0 LV SV:         76 LV SV Index:   38 LVOT Area:     4.52 cm  LV Volumes (MOD) LV vol d, MOD A4C: 124.0 ml LV vol s, MOD A4C: 47.8 ml LV SV MOD A4C:     124.0 ml RIGHT  VENTRICLE RV Basal diam:  2.80 cm RV S prime:     10.10 cm/s TAPSE (M-mode): 2.9 cm LEFT ATRIUM             Index       RIGHT ATRIUM           Index LA diam:        3.80 cm 1.90 cm/m  RA Area:     13.10 cm LA Vol (A2C):   31.8 ml 15.93 ml/m RA Volume:   29.70 ml  14.88 ml/m LA Vol (A4C):   33.0 ml 16.53 ml/m LA Biplane Vol: 34.9 ml 17.49 ml/m  AORTIC VALVE LVOT Vmax:   98.30 cm/s LVOT Vmean:  60.300 cm/s LVOT VTI:    0.169 m  AORTA Ao Root diam: 3.30 cm MITRAL VALVE MV Area (PHT): 3.85 cm    SHUNTS MV Decel Time: 197 msec    Systemic VTI:  0.17 m MV E velocity: 80.60 cm/s  Systemic Diam: 2.40 cm MV A velocity: 49.30 cm/s MV E/A ratio:  1.63 Rudean Haskell MD Electronically signed by Rudean Haskell MD Signature Date/Time: 08/24/2021/11:38:40 AM    Final    CT Renal Stone Study  Result Date: 08/23/2021 CLINICAL DATA:  Hematuria, new renal failure EXAM: CT ABDOMEN AND PELVIS WITHOUT CONTRAST TECHNIQUE: Multidetector CT imaging of the abdomen and pelvis was performed following the standard protocol without IV contrast. COMPARISON:  06/26/2007 FINDINGS: Lower chest: There is mild, scattered, nonspecific ground-glass and fine nodularity throughout the included bilateral lung bases (series 5, 4). Hepatobiliary: No solid liver abnormality is seen. No gallstones, gallbladder wall thickening, or biliary dilatation. Pancreas: Unremarkable. No pancreatic ductal dilatation or surrounding inflammatory changes. Spleen: Normal in size without  significant abnormality. Adrenals/Urinary Tract: Adrenal glands are unremarkable. Kidneys are normal, without renal calculi, solid lesion, or hydronephrosis. Distended urinary bladder, measuring at least 17.4 cm. Stomach/Bowel: Stomach is within normal limits. Appendix not clearly visualized and may be surgically. No evidence of bowel wall thickening, distention, or inflammatory changes. Descending and sigmoid diverticulosis. Vascular/Lymphatic: Aortic atherosclerosis. No enlarged abdominal or pelvic lymph nodes. Reproductive: No mass or other significant abnormality. Other: Small, fat containing bilateral inguinal hernias no abdominopelvic ascites. Musculoskeletal: No acute or significant osseous findings. IMPRESSION: 1. No evidence of urinary tract calculus or hydronephrosis. 2. Distended urinary bladder, measuring at least 17.4 cm. Correlate for urinary retention. 3. Descending and sigmoid diverticulosis without evidence of acute diverticulitis. 4. Mild, nonspecific scattered ground-glass and fine nodularity throughout the bilateral lung bases, possibly infectious or inflammatory. Aortic Atherosclerosis (ICD10-I70.0). Electronically Signed   By: Eddie Candle M.D.   On: 08/23/2021 16:04   US BIOPSY (KIDNEY)  Result Date: 08/28/2021 INDICATION: Acute kidney injury of uncertain etiology. Please perform image guided biopsy for tissue diagnostic purposes. EXAM: ULTRASOUND GUIDED RENAL BIOPSY COMPARISON:  CT abdomen and pelvis-08/23/2021 MEDICATIONS: None. ANESTHESIA/SEDATION: Fentanyl 100 mcg IV; Versed 2 mg IV Total Moderate Sedation time: 14 minutes; The patient was continuously monitored during the procedure by the interventional radiology nurse under my direct supervision. COMPLICATIONS: None immediate. PROCEDURE: Informed written consent was obtained from the patient after a discussion of the risks, benefits and alternatives to treatment. The patient understands and consents the procedure. A timeout was  performed prior to the initiation of the procedure. Ultrasound scanning was performed of the bilateral flanks. The inferior pole of the right kidney was selected for biopsy due to location and sonographic window. The procedure was planned. The operative site was prepped and draped  in the usual sterile fashion. The overlying soft tissues were anesthetized with 1% lidocaine with epinephrine. A 17 gauge core needle biopsy device was advanced into the inferior cortex of the right kidney and 3 core biopsies were obtained under direct ultrasound guidance. Images were saved for documentation purposes. The biopsy device was removed and hemostasis was obtained with manual compression. Post procedural scanning was negative for significant post procedural hemorrhage or additional complication. A dressing was placed. The patient tolerated the procedure well without immediate post procedural complication. IMPRESSION: Technically successful ultrasound guided right renal biopsy. Electronically Signed   By: Sandi Mariscal M.D.   On: 08/28/2021 12:52   IR EMBO ART  VEN HEMORR LYMPH EXTRAV  INC GUIDE ROADMAPPING  Result Date: 08/31/2021 INDICATION: 70 year old male with history of acute kidney injury of uncertain etiology status post ultrasound-guided right renal biopsy on 08/28/2021. Since biopsy, the patient has experienced gross hematuria with associated acute anemia. EXAM: 1. Ultrasound-guided vascular access of the right internal jugular vein. 2. Temporary hemodialysis catheter placement. 3. Ultrasound-guided vascular access of the right common femoral artery. 4. Selective catheterization and angiography of the right renal artery. 5. Sub selective catheterization angiography of right inferior polar and arcuate artery branches. 6. Coil embolization of right inferior pole arcuate artery branch. MEDICATIONS: None. ANESTHESIA/SEDATION: Moderate (conscious) sedation was employed during this procedure. A total of Versed 3 mg and  Fentanyl 50 mcg was administered intravenously. Moderate Sedation Time: 66 minutes. The patient's level of consciousness and vital signs were monitored continuously by radiology nursing throughout the procedure under my direct supervision. CONTRAST:  9m OMNIPAQUE IOHEXOL 350 MG/ML SOLN, 51mOMNIPAQUE IOHEXOL 350 MG/ML SOLN FLUOROSCOPY TIME:  Fluoroscopy Time: 10.6 minutes, (811 mGy). COMPLICATIONS: None immediate. PROCEDURE: Informed consent was obtained from the patient following explanation of the procedure, risks, benefits and alternatives. The patient understands, agrees and consents for the procedure. All questions were addressed. A time out was performed prior to the initiation of the procedure. Maximal barrier sterile technique utilized including caps, mask, sterile gowns, sterile gloves, large sterile drape, hand hygiene, and chlorhexidine prep. Preprocedure ultrasound evaluation of the right internal jugular vein demonstrated a patent and compressible vein free of internal echoes. Procedure was planned. Subdermal Local anesthesia was provided 1% lidocaine. A small skin nick was made. Under direct ultrasound visualization, a 21 gauge micropuncture needle was directed into the internal jugular vein. An image was captured and stored in the permanent record. A micropuncture set was inserted and exchanged for a J wire which was positioned in the inferior vena cava. Serial dilation was performed followed by placement of a 12.5 French, 24 cm Trialysis catheter. The catheter tip was positioned in the right atrium. Each lumen flushed and aspirated appropriately. The dialysis ports were locked with appropriate volume of heparin dwell. The middle central venous port was then used for sedation for the remainder of the procedure. The catheter was secured with a 0 silk retention suture. A sterile bandage was applied. Preprocedure ultrasound evaluation of the right groin was performed which demonstrated a patent right  common femoral artery. The procedure was planned. Subdermal Local anesthesia was provided with 1% lidocaine. A small skin nick was made. Under direct ultrasound visualization, a 21 gauge micropuncture needle was directed into the common femoral artery. An ultrasound image was captured and stored in the permanent record. A micro puncture set was inserted and a limited right lower extremity angiogram was performed which demonstrated appropriate puncture site for closure device use. A J  wire was directed to the abdominal aorta and the micropuncture set was exchanged for a 5 French vascular sheath. A 5 French C2 catheter was then directed into the right renal ostium. Right renal angiogram was performed. The single main renal artery was patent. 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 A straight lantern microcatheter and 0.014" soft synchro wire was then inserted and directed into the inferior polar branch. Repeat angiogram was performed in the sub selective location which was significant for multifocal 2-4 mm pseudoaneurysm formation, abnormal truncation and irregularity of the arcuate and interlobular branches, an early arteriovenous shunting. The inferior polar arcuate branch was selected further. Coil embolization was performed with multiple low profile Penumbra Ruby coils ranging from 2-3 mm in diameter. Completion right inferior renal angiogram was performed which demonstrated appropriate embolization of the targeted vessels without persistent arteriovenous fistula or vessel irregularity. The catheters were removed. The right common femoral artery was then closed with a 6 [place] Angio-Seal device. Distal pulses were unchanged. The patient tolerated the procedure well was transferred back to the floor in good  condition. IMPRESSION: 1. 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 2. Sub selective coil embolization of right inferior polar arcuate artery branch. 3. Successful placement of right internal jugular, 24 French Trialysis catheter with the catheter tip in the right atrium. The catheter is ready for immediate use. Ruthann Cancer, MD [facility] Electronically Signed   By: Ruthann Cancer M.D.   On: 08/31/2021 09:29    Surgical Pathology  CASE: [id number]  PATIENT: Juanya Nigg  Bone Marrow Report   Clinical History: Amyloid , right Iliac (BH)   DIAGNOSIS:   BONE MARROW, ASPIRATE, CLOT, CORE:  - Plasma cell myeloma, see comment.   PERIPHERAL BLOOD:  - Normocytic anemia.   COMMENT:   The marrow is normocellular but exhibits increased monoclonal plasma  cells (17% aspirate, 15-20% CD138 immunohistochemistry). The findings  are consistent with plasma cell myeloma. [place] red is pending and will  be reported in an addendum. There is some atypia in the megakaryocytes  and FISH for MDS was added for completeness.   MICROSCOPIC DESCRIPTION:   PERIPHERAL BLOOD SMEAR: There is a normocytic anemia with occasional  hypochromic cells.  There is no rouleaux formation.  Leukocytes are  present in normal numbers.  Circulating plasma cells are not identified.  Platelets are present in normal numbers.   BONE MARROW ASPIRATE: Spicular and cellular.  Erythroid precursors: Relative decrease in numbers.  No significant  dysplasia.  Granulocytic precursors: Relative increase in numbers.  No significant  dysplasia.  No increase in blasts.  Megakaryocytes: Mild increase in numbers.  Occasional forms with  hypolobated or abnormal nuclei.  Lymphocytes/plasma cells: Plasma cells are increased in numbers (6%  by  manual differential counts) with atypical forms (multinucleation, large  forms).  Lymphocytes are not increased.   TOUCH PREPARATIONS: Similar to aspirate smears.   CLOT AND BIOPSY: The core biopsy and clot section are normocellular for  age (30%).  There is a mild myeloid hyperplasia.  Megakaryocytes are  increased in numbers with scattered atypical forms. CD138  immunohistochemistry reveals increased plasma cells (15-20%) which are  scattered and with small  clusters. By light chain in situ hybridization  the plasma cells are lambda restricted.   IRON STAIN: Iron stains are performed on a bone marrow aspirate or touch  imprint smear and section of clot. The controls stained appropriately.        Storage Iron: Present       Ring Sideroblasts: Absent   ADDITIONAL DATA/TESTING: Cytogenetics, including FISH for myeloma and  MDS, was ordered and will be reported in an addendum.   CELL COUNT DATA:   Bone Marrow count performed on 500 cells shows:  Blasts:   0%   Myeloid:  66%  Promyelocytes: 0%   Erythroid:     11%  Myelocytes:    8%   Lymphocytes:   6%  Metamyelocytes:     1%   Plasma cells:  17%  Bands:    8%  Neutrophils:   41%  M:E ratio:     6.0  Eosinophils:   8%  Basophils:     0%  Monocytes:     0%   Lab Data: CBC performed on 09/05/21 shows:  WBC: 8.5 k/uL  Neutrophils:   57%  Hgb: 9.6 g/dL  Lymphocytes:   27%  HCT: 30.3 %    Monocytes:     8%  MCV: 84.6 fL   Eosinophils:   6%  RDW: 17.4 %    Basophils:     2%  PLT: 380 k/uL    ASSESSMENT AND PLAN: 1.  AL amyloidosis/plasma cell myeloma 2.  Anemia secondary to renal insufficiency, iron deficiency, and folate deficiency 3.  Acute kidney injury secondary to #1 4.  History of nephrolithiasis 5.  Streptococcus agalactiae bacteremia diagnosed 09/07/2021   -The patient is status post day 1 of cycle 1 of CyBorD.  Tolerated well the exception of mild nausea today.  He was encouraged to use as needed Zofran for the  nausea.  Plan is to administer weekly chemotherapy.  Next dose is due 09/17/2021.  If he remains inpatient, will administer here in the hospital, but if he is discharged prior to this date, will arrange for outpatient administration in our office.  However, it appears that he will require IV antibiotics through 09/16/2021 for bacteremia.  Will likely receive cycle 1 day 8 of chemotherapy inpatient and then discharge. -Creatinine mildly improved today.  Continue to monitor.  Nephrology is following. -Cardiac MRI suggestive of cardiac involvement by AL amyloidosis.  Cardiology following.  We will plan to continue chemotherapy as outlined above. -Continue acyclovir 200 mg every 12 hours.  Renally dosed. -His hemoglobin remains stable.  He is status post 4 doses of IV ferric gluconate.  He is also receiving folic acid.  Transfuse for hemoglobin less than 7.5.   LOS: 19 days   Mikey Bussing, DNP, AGPCNP-BC, AOCNP 09/11/21

## 2021-09-11 NOTE — Progress Notes (Signed)
PROGRESS NOTE        PATIENT DETAILS Name: Caleb Simmons Age: 70 y.o. Sex: male Date of Birth: 05/13/1951 Admit Date: 08/23/2021   Brief Narrative: Patient is a 70 y.o. male with history of nephrolithiasis-presenting with generalized weakness/shortness of breath-found to have AKI and severe normocytic anemia.  Patient was evaluated by nephrology, subsequently underwent right renal biopsy on 9/6 had postrenal biopsy bleeding into the renal collecting system-with clot retention requiring insertion of three-way catheter and CBI. Patient continued to have renal bleeding-on 9/8-IR performed embolization. On 9/10, received 2 units PRBC for hemoglobin of 6.6 and hematuria. On 9/12, renal biopsy showed amyloidosis. Oncology consulted for further management.   Antimicrobial therapy: None  Procedures : 9/06>> ultrasound-guided biopsy of right kidney 9/8>> 1) Right IJ temporary hemodialysis catheter placement 2) Right renal angiogram 3) Selective catheterization and embolization of right inferior pole arcuate artery  Consults: Nephrology, IR, urology, oncology  DVT Prophylaxis : Place and maintain sequential compression device Start: 09/01/21 1335 Place and maintain sequential compression device Start: 08/24/21 0519   Subjective: Pt reports some mild nausea, no vomiting. Denies any other new complaints.   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 Currently nonoliguric, creatinine level stable around 5 Renal biopsy showed AL amyloidosis Nephrology on board, started HD treatment on 09/07/21 via R IJ temp cath Oncology input greatly appreciated, bone marrow biopsy done on 09/05/2021 showed plasma cell myeloma, initiated treatment on 09/10/21 Daily renal panel  Acute combined CHF/cardiac amyloidosis Small to moderate pericardial effusion Appears euvolemic Repeat ECHO to evaluate for vegetations due to  bacteremia showed no vegetations but drop in EF to 45-50%, Grade 2DD, small to mod pericardial effusion HF team consulted, plan for work up to r/o cardiac amyloidosis, 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 No need for diuretics as vol status is stable  Sepsis 2/2 Grp B strep bacteremia Grp B strep UTI Noted to spike temp 100.8 with chills, tachycardic, leukocytosis BC X 2 Grp B strep, repeat NGTD Procalcitonin 0.76 LA WNL UA with significant hematuria, many bacteria, >50 WBC, UC with grp B strep CXR unremarkable for infection TTE with no known vegetation S/p Cefepime, Vancomycin---> IV Penicillin, last dose on 09/16/21 ID consulted, appreciate recs   Hematuria-acute urinary retention (clot retention) due to bleeding into the collecting system of the right kidney post renal biopsy on 9/6-requiring three-way Foley catheter insertion/CBI and embolization by IR on 9/8 Normocytic anemia/acute blood loss anemia Management per urology, Foley catheter discontinued 9/8, he does remain with some mild hematuria He did require multiple transfusions Frequent CBC  Anxiety Continue Xanax while he is in the hospital-is aware that we will not continue Xanax post discharge.  Use trazodone for sleep.  Patient is not interested in starting long-acting medications like SSRI for his anxiety-thinks that once he gets home-he will not require any further medications for his anxiety issues.  Tobacco abuse Continue transdermal nicotine.     Diet: Diet Order             Diet regular Room service appropriate? Yes; Fluid consistency: Thin  Diet effective now           Diet - low sodium heart healthy  Code Status: Full code   Family Communication: None at bedside  DVT prophylaxis: SCDs  Disposition Plan: Status is: Inpatient  Remains inpatient appropriate because:Inpatient level of care appropriate due to severity of illness  Dispo: The patient is from:  Home              Anticipated d/c is to: Home              Patient currently is not medically stable to d/c.   Difficult to place patient No    Antimicrobial agents: Anti-infectives (From admission, onward)    Start     Dose/Rate Route Frequency Ordered Stop   09/10/21 1130  acyclovir (ZOVIRAX) 200 MG capsule 200 mg        200 mg Oral Every 12 hours 09/10/21 1042     09/07/21 1805  penicillin G potassium 4 Million Units in dextrose 5 % 250 mL IVPB        4 Million Units 250 mL/hr over 60 Minutes Intravenous Every 8 hours 09/07/21 1805     09/07/21 1400  penicillin G potassium 4 Million Units in dextrose 5 % 250 mL IVPB  Status:  Discontinued        4 Million Units 250 mL/hr over 60 Minutes Intravenous Every 8 hours 09/07/21 0827 09/07/21 1805   09/06/21 1400  ceFEPIme (MAXIPIME) 1 g in sodium chloride 0.9 % 100 mL IVPB  Status:  Discontinued        1 g 200 mL/hr over 30 Minutes Intravenous Every 24 hours 09/06/21 1307 09/07/21 0827   09/06/21 1400  vancomycin (VANCOREADY) IVPB 1750 mg/350 mL        1,750 mg 175 mL/hr over 120 Minutes Intravenous  Once 09/06/21 1310 09/06/21 2256        MEDICATIONS: Scheduled Meds:  acyclovir  200 mg Oral Q12H   Chlorhexidine Gluconate Cloth  6 each Topical Daily   feeding supplement (NEPRO CARB STEADY)  237 mL Oral TID BM   folic acid  2 mg Oral Daily   Gerhardt's butt cream   Topical BID   melatonin  5 mg Oral QHS   nicotine  14 mg Transdermal Q24H   pantoprazole  40 mg Oral BID AC   sodium bicarbonate  1,300 mg Oral TID   tamsulosin  0.4 mg Oral Daily   Continuous Infusions:  sodium chloride     sodium chloride 10 mL/hr at 08/27/21 2147   pencillin G potassium IV 4 Million Units (09/11/21 1242)   sodium chloride irrigation     PRN Meds:.acetaminophen, ALPRAZolam, bisacodyl, diphenhydrAMINE, lidocaine, ondansetron **OR** ondansetron (ZOFRAN) IV, oxyCODONE, promethazine, technetium albumin aggregated, traMADol, traZODone   PHYSICAL  EXAM: Vital signs: Vitals:   09/11/21 0953 09/11/21 1200 09/11/21 1310 09/11/21 1400  BP:  138/87 138/87 131/77  Pulse:  80 84 80  Resp:  16  18  Temp:  97.6 F (36.4 C) 97.8 F (36.6 C) 97.7 F (36.5 C)  TempSrc:  Oral Oral Oral  SpO2:  98% 100% 98%  Weight: 75.4 kg     Height:       Filed Weights   09/07/21 1431 09/07/21 1615 09/11/21 0953  Weight: 71.7 kg 71 kg 75.4 kg   Body mass index is 23.86 kg/m.   General: NAD  Cardiovascular: S1, S2 present Respiratory: CTAB Abdomen: Soft, nontender, nondistended, bowel sounds present Musculoskeletal: No bilateral pedal edema noted Skin: Normal Psychiatry: Normal mood    I have personally reviewed following labs and  imaging studies  LABORATORY DATA: CBC: Recent Labs  Lab 09/07/21 0125 09/08/21 0530 09/09/21 0336 09/10/21 0229 09/11/21 0251  WBC 10.9* 7.5 12.7* 13.1* 10.6*  NEUTROABS  --   --   --  9.4* 7.0  HGB 9.6* 9.3* 9.3* 9.3* 9.0*  HCT 30.6* 29.5* 27.9* 29.1* 28.5*  MCV 85.2 85.0 81.8 84.3 85.8  PLT 370 380 389 409* 419*    Basic Metabolic Panel: Recent Labs  Lab 09/07/21 0125 09/08/21 0530 09/09/21 0336 09/10/21 0229 09/11/21 0251  NA 134* 133* 130* 134* 131*  K 3.9 3.3* 4.1 4.1 4.1  CL 98 98 95* 98 98  CO2 23 23 20* 21* 22  GLUCOSE 89 105* 226* 118* 139*  BUN 36* 32* 40* 41* 39*  CREATININE 6.39* 5.52* 5.43* 5.04* 4.90*  CALCIUM 8.0* 7.8* 7.6* 7.8* 7.5*  MG  --  1.7  --   --   --   PHOS 5.6* 3.9 2.3* 2.2* 3.3    GFR: Estimated Creatinine Clearance: 14.3 mL/min (A) (by C-G formula based on SCr of 4.9 mg/dL (H)).  Liver Function Tests: Recent Labs  Lab 09/07/21 0125 09/08/21 0530 09/09/21 0336 09/10/21 0229 09/11/21 0251  ALBUMIN 1.7* 1.6* 1.7* 1.6* 1.5*   No results for input(s): LIPASE, AMYLASE in the last 168 hours.  No results for input(s): AMMONIA in the last 168 hours.  Coagulation Profile: No results for input(s): INR, PROTIME in the last 168 hours.   Cardiac  Enzymes: No results for input(s): CKTOTAL, CKMB, CKMBINDEX, TROPONINI in the last 168 hours.  BNP (last 3 results) No results for input(s): PROBNP in the last 8760 hours.  Lipid Profile: No results for input(s): CHOL, HDL, LDLCALC, TRIG, CHOLHDL, LDLDIRECT in the last 72 hours.  Thyroid Function Tests: No results for input(s): TSH, T4TOTAL, FREET4, T3FREE, THYROIDAB in the last 72 hours.  Anemia Panel: No results for input(s): VITAMINB12, FOLATE, FERRITIN, TIBC, IRON, RETICCTPCT in the last 72 hours.   Urine analysis:    Component Value Date/Time   COLORURINE RED (A) 09/06/2021 1229   APPEARANCEUR TURBID (A) 09/06/2021 1229   LABSPEC  09/06/2021 1229    TEST NOT REPORTED DUE TO COLOR INTERFERENCE OF URINE PIGMENT   PHURINE  09/06/2021 1229    TEST NOT REPORTED DUE TO COLOR INTERFERENCE OF URINE PIGMENT   GLUCOSEU (A) 09/06/2021 1229    TEST NOT REPORTED DUE TO COLOR INTERFERENCE OF URINE PIGMENT   HGBUR (A) 09/06/2021 1229    TEST NOT REPORTED DUE TO COLOR INTERFERENCE OF URINE PIGMENT   BILIRUBINUR (A) 09/06/2021 1229    TEST NOT REPORTED DUE TO COLOR INTERFERENCE OF URINE PIGMENT   KETONESUR (A) 09/06/2021 1229    TEST NOT REPORTED DUE TO COLOR INTERFERENCE OF URINE PIGMENT   PROTEINUR (A) 09/06/2021 1229    TEST NOT REPORTED DUE TO COLOR INTERFERENCE OF URINE PIGMENT   UROBILINOGEN 0.2 06/26/2007 1023   NITRITE (A) 09/06/2021 1229    TEST NOT REPORTED DUE TO COLOR INTERFERENCE OF URINE PIGMENT   LEUKOCYTESUR (A) 09/06/2021 1229    TEST NOT REPORTED DUE TO COLOR INTERFERENCE OF URINE PIGMENT    Sepsis Labs: Lactic Acid, Venous    Component Value Date/Time   LATICACIDVEN 0.8 09/06/2021 1512    MICROBIOLOGY: Recent Results (from the past 240 hour(s))  Urine Culture     Status: Abnormal   Collection Time: 09/06/21 12:29 PM   Specimen: Urine, Clean Catch  Result Value Ref Range Status   Specimen Description  URINE, CLEAN CATCH  Final   Special Requests NONE  Final    Culture (A)  Final    >=100,000 COLONIES/mL GROUP B STREP(S.AGALACTIAE)ISOLATED TESTING AGAINST S. AGALACTIAE NOT ROUTINELY PERFORMED DUE TO PREDICTABILITY OF AMP/PEN/VAN SUSCEPTIBILITY. Performed at [facility], [place] [address] [place], [place] [address]    Report Status 09/07/2021 FINAL  Final  Culture, blood (routine x 2)     Status: Abnormal   Collection Time: 09/06/21 12:34 PM   Specimen: BLOOD RIGHT HAND  Result Value Ref Range Status   Specimen Description BLOOD RIGHT HAND  Final   Special Requests   Final    BOTTLES DRAWN AEROBIC AND ANAEROBIC Blood Culture adequate volume   Culture  Setup Time   Final    GRAM POSITIVE COCCI IN CHAINS IN BOTH AEROBIC AND ANAEROBIC BOTTLES CRITICAL RESULT CALLED TO, READ BACK BY AND VERIFIED WITH: V BRYK,PHARMD@[id number]  09/07/21 [place] Performed at [facility], [place] [address] [place], [place] [address]    Culture STREPTOCOCCUS AGALACTIAE (A)  Final   Report Status 09/09/2021 FINAL  Final   Organism ID, Bacteria STREPTOCOCCUS AGALACTIAE  Final      Susceptibility   Streptococcus agalactiae - MIC*    CLINDAMYCIN >=1 RESISTANT Resistant     ERYTHROMYCIN >=8 RESISTANT Resistant     VANCOMYCIN 0.5 SENSITIVE Sensitive     CEFTRIAXONE <=0.12 SENSITIVE Sensitive     LEVOFLOXACIN 1 SENSITIVE Sensitive     PENICILLIN Value in next row Sensitive      SENSITIVE<=0.06    * STREPTOCOCCUS AGALACTIAE  Blood Culture ID Panel (Reflexed)     Status: Abnormal   Collection Time: 09/06/21 12:34 PM  Result Value Ref Range Status   Enterococcus faecalis NOT DETECTED NOT DETECTED Final   Enterococcus Faecium NOT DETECTED NOT DETECTED Final   Listeria monocytogenes NOT DETECTED NOT DETECTED Final   Staphylococcus species NOT DETECTED NOT DETECTED Final   Staphylococcus aureus (BCID) NOT DETECTED NOT DETECTED Final   Staphylococcus epidermidis NOT DETECTED NOT DETECTED Final   Staphylococcus lugdunensis NOT DETECTED NOT DETECTED Final   Streptococcus  species DETECTED (A) NOT DETECTED Final    Comment: CRITICAL RESULT CALLED TO, READ BACK BY AND VERIFIED WITH: V BRYK,PHARMD@[id number]  09/07/21 [place]    Streptococcus agalactiae DETECTED (A) NOT DETECTED Final    Comment: CRITICAL RESULT CALLED TO, READ BACK BY AND VERIFIED WITH: V BRYK,PHARMD@[id number]  09/07/21 [place]    Streptococcus pneumoniae NOT DETECTED NOT DETECTED Final   Streptococcus pyogenes NOT DETECTED NOT DETECTED Final   A.calcoaceticus-baumannii NOT DETECTED NOT DETECTED Final   Bacteroides fragilis NOT DETECTED NOT DETECTED Final   Enterobacterales NOT DETECTED NOT DETECTED Final   Enterobacter cloacae complex NOT DETECTED NOT DETECTED Final   Escherichia coli NOT DETECTED NOT DETECTED Final   Klebsiella aerogenes NOT DETECTED NOT DETECTED Final   Klebsiella oxytoca NOT DETECTED NOT DETECTED Final   Klebsiella pneumoniae NOT DETECTED NOT DETECTED Final   Proteus species NOT DETECTED NOT DETECTED Final   Salmonella species NOT DETECTED NOT DETECTED Final   Serratia marcescens NOT DETECTED NOT DETECTED Final   Haemophilus influenzae NOT DETECTED NOT DETECTED Final   Neisseria meningitidis NOT DETECTED NOT DETECTED Final   Pseudomonas aeruginosa NOT DETECTED NOT DETECTED Final   Stenotrophomonas maltophilia NOT DETECTED NOT DETECTED Final   Candida albicans NOT DETECTED NOT DETECTED Final   Candida auris NOT DETECTED NOT DETECTED Final   Candida glabrata NOT DETECTED NOT DETECTED Final   Candida krusei NOT  DETECTED NOT DETECTED Final   Candida parapsilosis NOT DETECTED NOT DETECTED Final   Candida tropicalis NOT DETECTED NOT DETECTED Final   Cryptococcus neoformans/gattii NOT DETECTED NOT DETECTED Final    Comment: Performed at [facility], [place] [address], [place], [place] [address]  Culture, blood (routine x 2)     Status: Abnormal   Collection Time: 09/06/21 12:46 PM   Specimen: BLOOD RIGHT HAND  Result Value Ref Range Status   Specimen Description BLOOD RIGHT HAND  Final    Special Requests   Final    BOTTLES DRAWN AEROBIC AND ANAEROBIC Blood Culture adequate volume   Culture  Setup Time   Final    GRAM POSITIVE COCCI IN CHAINS IN BOTH AEROBIC AND ANAEROBIC BOTTLES CRITICAL VALUE NOTED.  VALUE IS CONSISTENT WITH PREVIOUSLY REPORTED AND CALLED VALUE.    Culture (A)  Final    STREPTOCOCCUS AGALACTIAE SUSCEPTIBILITIES PERFORMED ON PREVIOUS CULTURE WITHIN THE LAST 5 DAYS. Performed at [facility], [place] [address], [place], [place] [address]    Report Status 09/09/2021 FINAL  Final  Culture, blood (Routine X 2) w Reflex to ID Panel     Status: None (Preliminary result)   Collection Time: 09/08/21  5:30 AM   Specimen: BLOOD LEFT HAND  Result Value Ref Range Status   Specimen Description BLOOD LEFT HAND  Final   Special Requests AEROBIC BOTTLE ONLY Blood Culture adequate volume  Final   Culture   Final    NO GROWTH 3 DAYS Performed at [facility], [place] [address], [place], [place] [address]    Report Status PENDING  Incomplete  Culture, blood (routine x 2)     Status: None (Preliminary result)   Collection Time: 09/08/21  5:31 AM   Specimen: BLOOD RIGHT HAND  Result Value Ref Range Status   Specimen Description BLOOD RIGHT HAND  Final   Special Requests AEROBIC BOTTLE ONLY Blood Culture adequate volume  Final   Culture   Final    NO GROWTH 3 DAYS Performed at [facility], [place] [address], [place], [place] [address]    Report Status PENDING  Incomplete     RADIOLOGY STUDIES/RESULTS: MR CARDIAC MORPHOLOGY WO CONTRAST  Result Date: 09/10/2021 CLINICAL DATA:  Concern for cardiac amyloidosis. EXAM: CARDIAC MRI TECHNIQUE: The patient was scanned on a 1.5 Tesla GE magnet. A dedicated cardiac coil was used. Functional imaging was done using Fiesta sequences. 2,3, and 4 chamber views were done to assess for RWMA's. Modified Simpson's rule using a short axis stack was used to calculate an ejection fraction on a dedicated work Brewing technologist. T1 and T2 sequences done. No contrast due to AKI and unstable renal function. CONTRAST:  None FINDINGS: Limited images of the lung fields showed small bilateral pleural effusions. Small inferior pericardial effusion. Normal left ventricular size with moderate LV hypertrophy. Moderate diffuse hypokinesis with EF 38%. Normal right ventricular size with mild-moderate dysfunction, EF 36%. The aortic valve is trileaflet with no significant stenosis or regurgitation. Normal right atrial size. Mild left atrial enlargement. No significant mitral regurgitation. Measurements: T1 1217 T2 53 LVEDV 134 mL LVSV 51 mL LVEF 38% RVEDV 98 mL RVSV 35 mL RVEF 36% IMPRESSION: 1. Normal LV size with moderate LV hypertrophy. EF 38% with diffuse hypokinesis. 2.  Normal RV size with EF 36%. 3.  T1 elevated, T2 borderline elevated. 4.  Small inferior pericardial effusion. This study could be consistent with cardiac amyloidosis,  showing moderate LVH with small effusion. 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 Dalton Mclean Electronically Signed   By: Loralie Champagne M.D.   On: 09/10/2021 17:55     LOS: 19 days   Alma Friendly, MD  [facility]    09/11/2021, 6:48 PM

## 2021-09-11 NOTE — TOC Initial Note (Signed)
Transition of Care [facility]) - Initial/Assessment Note    Patient Details  Name: Caleb Simmons MRN: [id number] Date of Birth: [birth year]  [facility] of [facility] CM/SW Contact:    Erenest Rasher, RN Phone Number: [phone] 09/11/2021, 5:31 PM  Clinical Narrative:                 HF TOC CM spoke to pt's DIL, Louann Leng. DIL states the plan maybe that pt dc to [place] to their home. Pt lives in home alone but may have a friend that can stay in his home. Pt has RW and cane at home. Offered choice for [facility] and agreeable to [place] as they may agency in [place] and [place]. Will need orders for [facility] with F2F.   Expected Discharge Plan: [place] Barriers to Discharge: Continued Medical Work up   Patient Goals and CMS Choice Patient states their goals for this hospitalization and ongoing recovery are:: remains independent CMS Medicare.gov Compare Post Acute Care list provided to:: Patient Represenative (must comment) Choice offered to / list presented to : Adult Children  Expected Discharge Plan and Services Expected Discharge Plan: [place] In-house Referral: Clinical Social Work Discharge Planning Services: CM Consult Post Acute Care Choice: [place] arrangements for the past 2 months: [place]   Prior Living Arrangements/Services Living arrangements for the past 2 months: Single Family Home Lives with:: Self Patient language and need for interpreter reviewed:: Yes Do you feel safe going back to the place where you live?: Yes      Need for Family Participation in Patient Care: No (Comment) Care giver support system in place?: No (comment)   Criminal Activity/Legal Involvement Pertinent to Current Situation/Hospitalization: No - Comment as needed  Activities of Daily Living Home Assistive Devices/Equipment: None ADL Screening (condition at time of admission) Patient's cognitive ability adequate to  safely complete daily activities?: Yes Is the patient deaf or have difficulty hearing?: Yes Does the patient have difficulty seeing, even when wearing glasses/contacts?: No Does the patient have difficulty concentrating, remembering, or making decisions?: No Patient able to express need for assistance with ADLs?: No Does the patient have difficulty dressing or bathing?: No Independently performs ADLs?: Yes (appropriate for developmental age) Does the patient have difficulty walking or climbing stairs?: Yes Weakness of Legs: None Weakness of Arms/Hands: None  Permission Sought/Granted Permission sought to share information with : Case Manager, PCP, Family Supports Permission granted to share information with : Yes, Verbal Permission Granted  Share Information with NAME: Gaffer  Permission granted to share info w AGENCY: [place] granted to share info w Relationship: daughter in law  Permission granted to share info w Contact Information: [phone]  Emotional Assessment Appearance:: Other (Comment Required (unable to go in room due to chemotherapy note on patients door) Attitude/Demeanor/Rapport: Unable to Assess Affect (typically observed): Unable to Assess Orientation: : Oriented to Self, Oriented to Place, Oriented to  Time, Oriented to Situation Alcohol / Substance Use: Not Applicable Psych Involvement: No (comment)  Admission diagnosis:  Elevated troponin [R77.8] AKI (acute kidney injury) ([place]) [N17.9] Symptomatic anemia [D64.9] Patient Active Problem List   Diagnosis Date Noted   Bacteremia due to group B Streptococcus 09/10/2021   Acute kidney injury ([place])    Amyloidosis ([place])    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

## 2021-09-11 NOTE — Progress Notes (Signed)
[facility] ROUNDING NOTE   Subjective:   Interval History: 70 year old nephrolithiasis generalized weakness presented with acute kidney injury.  Status post renal biopsy 08/28/2021.  Had postbiopsy bleeding into the renal collecting system with clot retention.  Demonstration of three-way catheter performed.  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  She is undergoing dialysis 09/07/2021.  We are dialyzing on a as needed basis.  We are coordinating treatment with his chemotherapy.  Last dialysis was 09/07/2021 with 2.4 L removed.  He is making good urine with 3L 09/10/2021  Blood pressure 136/97 pulse 93 temperature 97.8 O2 sats 100% room air  Sodium 131 potassium 4.1 chloride 98 CO2 22 BUN of 39 creatinine 4.9 glucose 139 calcium 7.5 phosphorus 3.3 hemoglobin 9.0    Objective:  Vital signs in last 24 hours:  Temp:  [97.5 F (36.4 C)-97.9 F (36.6 C)] 97.8 F (36.6 C) (09/20 0700) Pulse Rate:  [69-111] 82 (09/20 0700) Resp:  [14-18] 14 (09/20 0700) BP: (108-131)/(73-87) 130/81 (09/20 0700) SpO2:  [97 %-98 %] 98 % (09/20 0700) Weight:  [75.4 kg] 75.4 kg (09/20 0953)  Weight change:  Filed Weights   09/07/21 1431 09/07/21 1615 09/11/21 0953  Weight: 71.7 kg 71 kg 75.4 kg    Intake/Output: I/O last 3 completed shifts: In: 500 [IV Piggyback:500] Out: 7169 [Urine:4455]   Intake/Output this shift:  Total I/O In: 240 [P.O.:240] Out: 350 [Urine:350]  General: nad Heart: rrr Lungs: normal wob, cta bl Abdomen: Soft, nontender Extremities: No LE edema. Neurology: Alert, awake, following commands, no asterixis Vascular Access: Right IJ temporary HD catheter in place.   Basic Metabolic Panel: Recent Labs  Lab 09/07/21 0125 09/08/21 0530 09/09/21 0336 09/10/21 0229 09/11/21 0251  NA 134* 133* 130* 134* 131*  K 3.9 3.3* 4.1 4.1 4.1  CL 98 98 95* 98 98  CO2 23 23 20* 21* 22  GLUCOSE 89 105* 226*  118* 139*  BUN 36* 32* 40* 41* 39*  CREATININE 6.39* 5.52* 5.43* 5.04* 4.90*  CALCIUM 8.0* 7.8* 7.6* 7.8* 7.5*  MG  --  1.7  --   --   --   PHOS 5.6* 3.9 2.3* 2.2* 3.3     Liver Function Tests: Recent Labs  Lab 09/07/21 0125 09/08/21 0530 09/09/21 0336 09/10/21 0229 09/11/21 0251  ALBUMIN 1.7* 1.6* 1.7* 1.6* 1.5*    No results for input(s): LIPASE, AMYLASE in the last 168 hours. No results for input(s): AMMONIA in the last 168 hours.  CBC: Recent Labs  Lab 09/07/21 0125 09/08/21 0530 09/09/21 0336 09/10/21 0229 09/11/21 0251  WBC 10.9* 7.5 12.7* 13.1* 10.6*  NEUTROABS  --   --   --  9.4* 7.0  HGB 9.6* 9.3* 9.3* 9.3* 9.0*  HCT 30.6* 29.5* 27.9* 29.1* 28.5*  MCV 85.2 85.0 81.8 84.3 85.8  PLT 370 380 389 409* 419*     Cardiac Enzymes: No results for input(s): CKTOTAL, CKMB, CKMBINDEX, TROPONINI in the last 168 hours.  BNP: Invalid input(s): POCBNP  CBG: No results for input(s): GLUCAP in the last 168 hours.  Microbiology: Results for orders placed or performed during the hospital encounter of 08/23/21  SARS CORONAVIRUS 2 (TAT 6-24 HRS) Nasopharyngeal Nasopharyngeal Swab     Status: None   Collection Time: 08/23/21  5:11 PM   Specimen: Nasopharyngeal Swab  Result Value Ref Range Status   SARS Coronavirus 2 NEGATIVE NEGATIVE Final    Comment: (NOTE) SARS-CoV-2 target nucleic acids  are NOT DETECTED.  The SARS-CoV-2 RNA is generally detectable in upper and lower respiratory specimens during the acute phase of infection. Negative results do not preclude SARS-CoV-2 infection, do not rule out co-infections with other pathogens, and should not be used as the sole basis for treatment or other patient management decisions. Negative results must be combined with clinical observations, patient history, and epidemiological information. The expected result is Negative.  Fact Sheet for Patients: SugarRoll.be  Fact Sheet for Healthcare  Providers: https://www.woods-mathews.com/  This test is not yet approved or cleared by the [place] FDA and  has been authorized for detection and/or diagnosis of SARS-CoV-2 by FDA under an Emergency Use Authorization (EUA). This EUA will remain  in effect (meaning this test can be used) for the duration of the COVID-19 declaration under Se ction 564(b)(1) of the Act, 21 U.S.C. section 360bbb-3(b)(1), unless the authorization is terminated or revoked sooner.  Performed at [facility], [place] [address] [place], [place] [address]   Urine Culture     Status: Abnormal   Collection Time: 09/06/21 12:29 PM   Specimen: Urine, Clean Catch  Result Value Ref Range Status   Specimen Description URINE, CLEAN CATCH  Final   Special Requests NONE  Final   Culture (A)  Final    >=100,000 COLONIES/mL GROUP B STREP(S.AGALACTIAE)ISOLATED TESTING AGAINST S. AGALACTIAE NOT ROUTINELY PERFORMED DUE TO PREDICTABILITY OF AMP/PEN/VAN SUSCEPTIBILITY. Performed at [facility], [place] [address] [place], [place] [address]    Report Status 09/07/2021 FINAL  Final  Culture, blood (routine x 2)     Status: Abnormal   Collection Time: 09/06/21 12:34 PM   Specimen: BLOOD RIGHT HAND  Result Value Ref Range Status   Specimen Description BLOOD RIGHT HAND  Final   Special Requests   Final    BOTTLES DRAWN AEROBIC AND ANAEROBIC Blood Culture adequate volume   Culture  Setup Time   Final    GRAM POSITIVE COCCI IN CHAINS IN BOTH AEROBIC AND ANAEROBIC BOTTLES CRITICAL RESULT CALLED TO, READ BACK BY AND VERIFIED WITH: V BRYK,[email or web address]_[email or web address]  09/07/21 [place] Performed at [facility], [place] [address], [place], [place] [address]    Culture STREPTOCOCCUS AGALACTIAE (A)  Final   Report Status 09/09/2021 FINAL  Final   Organism ID, Bacteria STREPTOCOCCUS AGALACTIAE  Final      Susceptibility   Streptococcus agalactiae - MIC*    CLINDAMYCIN >=1 RESISTANT Resistant     ERYTHROMYCIN >=8 RESISTANT  Resistant     VANCOMYCIN 0.5 SENSITIVE Sensitive     CEFTRIAXONE <=0.12 SENSITIVE Sensitive     LEVOFLOXACIN 1 SENSITIVE Sensitive     PENICILLIN Value in next row Sensitive      SENSITIVE<=0.06    * STREPTOCOCCUS AGALACTIAE  Blood Culture ID Panel (Reflexed)     Status: Abnormal   Collection Time: 09/06/21 12:34 PM  Result Value Ref Range Status   Enterococcus faecalis NOT DETECTED NOT DETECTED Final   Enterococcus Faecium NOT DETECTED NOT DETECTED Final   Listeria monocytogenes NOT DETECTED NOT DETECTED Final   Staphylococcus species NOT DETECTED NOT DETECTED Final   Staphylococcus aureus (BCID) NOT DETECTED NOT DETECTED Final   Staphylococcus epidermidis NOT DETECTED NOT DETECTED Final   Staphylococcus lugdunensis NOT DETECTED NOT DETECTED Final   Streptococcus species DETECTED (A) NOT DETECTED Final    Comment: CRITICAL RESULT CALLED TO, READ BACK BY AND VERIFIED WITH: V BRYK,PHARMD_1  09/07/21 [place]    Streptococcus agalactiae DETECTED (A) NOT DETECTED Final  Comment: CRITICAL RESULT CALLED TO, READ BACK BY AND VERIFIED WITH: V BRYK,PHARMD_0  09/07/21 [place]    Streptococcus pneumoniae NOT DETECTED NOT DETECTED Final   Streptococcus pyogenes NOT DETECTED NOT DETECTED Final   A.calcoaceticus-baumannii NOT DETECTED NOT DETECTED Final   Bacteroides fragilis NOT DETECTED NOT DETECTED Final   Enterobacterales NOT DETECTED NOT DETECTED Final   Enterobacter cloacae complex NOT DETECTED NOT DETECTED Final   Escherichia coli NOT DETECTED NOT DETECTED Final   Klebsiella aerogenes NOT DETECTED NOT DETECTED Final   Klebsiella oxytoca NOT DETECTED NOT DETECTED Final   Klebsiella pneumoniae NOT DETECTED NOT DETECTED Final   Proteus species NOT DETECTED NOT DETECTED Final   Salmonella species NOT DETECTED NOT DETECTED Final   Serratia marcescens NOT DETECTED NOT DETECTED Final   Haemophilus influenzae NOT DETECTED NOT DETECTED Final   Neisseria meningitidis NOT DETECTED NOT DETECTED Final    Pseudomonas aeruginosa NOT DETECTED NOT DETECTED Final   Stenotrophomonas maltophilia NOT DETECTED NOT DETECTED Final   Candida albicans NOT DETECTED NOT DETECTED Final   Candida auris NOT DETECTED NOT DETECTED Final   Candida glabrata NOT DETECTED NOT DETECTED Final   Candida krusei NOT DETECTED NOT DETECTED Final   Candida parapsilosis NOT DETECTED NOT DETECTED Final   Candida tropicalis NOT DETECTED NOT DETECTED Final   Cryptococcus neoformans/gattii NOT DETECTED NOT DETECTED Final    Comment: Performed at [facility], [address], [place], [place] [address]  Culture, blood (routine x 2)     Status: Abnormal   Collection Time: 09/06/21 12:46 PM   Specimen: BLOOD RIGHT HAND  Result Value Ref Range Status   Specimen Description BLOOD RIGHT HAND  Final   Special Requests   Final    BOTTLES DRAWN AEROBIC AND ANAEROBIC Blood Culture adequate volume   Culture  Setup Time   Final    GRAM POSITIVE COCCI IN CHAINS IN BOTH AEROBIC AND ANAEROBIC BOTTLES CRITICAL VALUE NOTED.  VALUE IS CONSISTENT WITH PREVIOUSLY REPORTED AND CALLED VALUE.    Culture (A)  Final    STREPTOCOCCUS AGALACTIAE SUSCEPTIBILITIES PERFORMED ON PREVIOUS CULTURE WITHIN THE LAST 5 DAYS. Performed at [facility], [place] [address], [place], [place] [address]    Report Status 09/09/2021 FINAL  Final  Culture, blood (Routine X 2) w Reflex to ID Panel     Status: None (Preliminary result)   Collection Time: 09/08/21  5:30 AM   Specimen: BLOOD LEFT HAND  Result Value Ref Range Status   Specimen Description BLOOD LEFT HAND  Final   Special Requests AEROBIC BOTTLE ONLY Blood Culture adequate volume  Final   Culture   Final    NO GROWTH 3 DAYS Performed at [facility], [place] [address], [place], [place] [address]    Report Status PENDING  Incomplete  Culture, blood (routine x 2)     Status: None (Preliminary result)   Collection Time: 09/08/21  5:31 AM   Specimen: BLOOD RIGHT HAND  Result Value Ref  Range Status   Specimen Description BLOOD RIGHT HAND  Final   Special Requests AEROBIC BOTTLE ONLY Blood Culture adequate volume  Final   Culture   Final    NO GROWTH 3 DAYS Performed at [facility], [place] [address] [place], [place] [address]    Report Status PENDING  Incomplete    Coagulation Studies: No results for input(s): LABPROT, INR in the last 72 hours.  Urinalysis: No results for input(s): COLORURINE, LABSPEC, [place], [place],  HGBUR, BILIRUBINUR, KETONESUR, PROTEINUR, UROBILINOGEN, NITRITE, LEUKOCYTESUR in the last 72 hours.  Invalid input(s): APPERANCEUR    Imaging: MR CARDIAC MORPHOLOGY WO CONTRAST  Result Date: 09/10/2021 CLINICAL DATA:  Concern for cardiac amyloidosis. EXAM: CARDIAC MRI TECHNIQUE: The patient was scanned on a 1.5 Tesla GE magnet. A dedicated cardiac coil was used. Functional imaging was done using Fiesta sequences. 2,3, and 4 chamber views were done to assess for RWMA's. Modified Simpson's rule using a short axis stack was used to calculate an ejection fraction on a dedicated work Conservation officer, nature. T1 and T2 sequences done. No contrast due to AKI and unstable renal function. CONTRAST:  None FINDINGS: Limited images of the lung fields showed small bilateral pleural effusions. Small inferior pericardial effusion. Normal left ventricular size with moderate LV hypertrophy. Moderate diffuse hypokinesis with EF 38%. Normal right ventricular size with mild-moderate dysfunction, EF 36%. The aortic valve is trileaflet with no significant stenosis or regurgitation. Normal right atrial size. Mild left atrial enlargement. No significant mitral regurgitation. Measurements: T1 1217 T2 53 LVEDV 134 mL LVSV 51 mL LVEF 38% RVEDV 98 mL RVSV 35 mL RVEF 36% IMPRESSION: 1. Normal LV size with moderate LV hypertrophy. EF 38% with diffuse hypokinesis. 2.  Normal RV size with EF 36%. 3.  T1 elevated, T2 borderline elevated. 4.  Small inferior pericardial effusion.  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 Dalton Mclean Electronically Signed   By: Loralie Champagne M.D.   On: 09/10/2021 17:55     Medications:    sodium chloride     sodium chloride 10 mL/hr at 08/27/21 2147   pencillin G potassium IV 4 Million Units (09/11/21 0209)   sodium chloride irrigation      acyclovir  200 mg Oral Q12H   Chlorhexidine Gluconate Cloth  6 each Topical Daily   feeding supplement (NEPRO CARB STEADY)  237 mL Oral TID BM   folic acid  2 mg Oral Daily   Gerhardt's butt cream   Topical BID   melatonin  5 mg Oral QHS   nicotine  14 mg Transdermal Q24H   pantoprazole  40 mg Oral BID AC   sodium bicarbonate  1,300 mg Oral TID   tamsulosin  0.4 mg Oral Daily   acetaminophen, ALPRAZolam, bisacodyl, diphenhydrAMINE, lidocaine, ondansetron **OR** ondansetron (ZOFRAN) IV, oxyCODONE, promethazine, technetium albumin aggregated, traMADol, traZODone  Assessment/ Plan:   #Acute kidney injury, nonoliguric, with history of excessive NSAID's use/BC powders every day.  Peaked creatinine level of 6.95.  CT scan without obstruction or hydronephrosis.  UA with more than 300 protein and microscopic hematuria.  Serology evaluation including ANA, ANCA, C3, C4, SPEP, anti-GBM, double-stranded DNA, hep B, hep C, HIV negative.   -He underwent IR guided kidney biopsy on 9/6 complicated by postbiopsy bleeding. Biopsy: AL amyloidosis, lambda light chain composition (glomeruli, interstitium, arteries, and arterioles). Marked IF, 59% global glomerular sclerosis and marked arteriosclerosis. Final read/EM pending. -He had right IJ temporary HD catheter placed on 9/8 in IR during embolization of the bleeding artery.   -Suspected uremia given persistent lack of appetite, dysgeusia, hiccups therefore proceeded with HD on 9/16 (slow start protocol). Symptoms were remarkably  better and is still the case today. Will hold on HD tomorrow especially given planned onc treatments tomorrow..Not sure if he will require another treatment in the near future therefore would recommend maintaining HD catheter -Check  daily lab,s strict ins and out   #Post kidney biopsy bleeding, syncope/acute blood loss anemia: He initially received continuous bladder irrigation.  Because of ongoing bleeding, he underwent embolization of right inferior pole arcuate artery and right IJ temporary HD catheter placement by IR on 9/8.  Still having some hematuria, not on CBI at the moment, urology is following; defer to them.  Monitor CBC.   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 Likely urinary source -iv pcn, per primary   #Pulm edema -on CXR 9/12, IVF stopped, received lasix 45m IV x 1 dose, prn diuresis. No need for diuretics today   #Anemia, complicated by acute blood loss post kidney biopsy: Urology and IR is following.  Received multiple units of blood transfusion.  Hemoglobin is stable today.On admission FOBT was negative and treated with IV iron. Holding on ESA for now given amyloidosis   #History of nephrolithiasis: Repeat CT scan without any obstruction or hydronephrosis.  Urology is following   #Metabolic acidosis: Continue oral sodium bicarbonate, monitor   LOS: 1[place]_0 _1 :20 AM

## 2021-09-11 NOTE — Progress Notes (Signed)
Physical Therapy Treatment & Discharge Patient Details Name: Caleb Simmons MRN: [id number] DOB: [birth year] Today's Date: 09/11/2021   History of Present Illness Pt is a 70 y.o. male admitted 08/23/21 with generalized weakness, SOB. FOund to have AKI, severe normocytic anemia, Grp B strep bacteremia/Grp B strep UTI. S/p R renal biopsy 9/6; pt developed bleeding with clot retention requiring insertion of three-way catheter and CBI; bleeding continued s/p embolization 9/8. Pt with R iliac bone marrow biopsy 9/14. Pt with AL amyloidosis/plasma cell myeloma; chemotherapy initiated 9/19. Concern for cardiac involvement with cardiac MRI 9/19. PMH includes nephrolithiasis, tobacco use.   PT Comments    Pt progressing well with mobility. Pt independent with transfers and ambulation without DME; pt demonstrates good self-awareness of activity pacing, fatigue and limitations, and how to adjust mobility accordingly. Reviewed educ re: therex, activity recommendations, fall risk reduction, importance of mobility, effects of chemo on activity tolerance. Pt reports no further questions or concerns; has met short-term acute PT goals. Will d/c acute PT.    Recommendations for follow up therapy are one component of a multi-disciplinary discharge planning process, led by the attending physician.  Recommendations may be updated based on patient status, additional functional criteria and insurance authorization.  Follow Up Recommendations  No PT follow up;Supervision - Intermittent     Equipment Recommendations  None recommended by PT    Recommendations for Other Services       Precautions / Restrictions Precautions Precautions: Other (comment) Precaution Comments: Watch BP - hypotension standing (asymptomatic this session), but increases with additional activity Restrictions Weight Bearing Restrictions: No     Mobility  Bed Mobility Overal bed mobility: Independent                   Transfers Overall transfer level: Independent Equipment used: None                Ambulation/Gait Ambulation/Gait assistance: Independent Gait Distance (Feet): 500 Feet Assistive device: None Gait Pattern/deviations: Step-through pattern;Decreased stride length Gait velocity: Decreased   General Gait Details: Slow, steady gait indep without DME; 2x standing rest breaks secondary to DOE; pt conversant and wearing face mask entire walk; denies dizziness, endorses bilateral chronic foot pain; no overt instability or LOB with higher level balance tasks   Stairs Stairs:  (pt declined stair training; reports has 2-3 stairs with sturdy rail support)           Wheelchair Mobility    Modified Rankin (Stroke Patients Only)       Balance Overall balance assessment: No apparent balance deficits (not formally assessed)   Sitting balance-Leahy Scale: Normal       Standing balance-Leahy Scale: Good               High level balance activites: Side stepping;Backward walking;Direction changes;Turns;Sudden stops;Head turns High Level Balance Comments: no overt LOB or instability with higher level balance tasks            Cognition Arousal/Alertness: Awake/alert Behavior During Therapy: WFL for tasks assessed/performed Overall Cognitive Status: Within Functional Limits for tasks assessed                                        Exercises Other Exercises Other Exercises: Repeated sit<>stands from recliner, LAQ, seated hip flex    General Comments General comments (skin integrity, edema, etc.): Supine BP 125/81, sitting BP 127/80, standing BP 102/78,  post-ambulation BP 136/97; HR up to 110s. Reviewed signs/symptoms of dizziness and strategies for safety/fall risk reduction; educ re: activity recommendations, therex, importance of mobility      Pertinent Vitals/Pain Pain Assessment: Faces Faces Pain Scale: Hurts a little bit Pain Location: L-side  ribs (pt suspects related to transfer to MRI table yesterday) Pain Descriptors / Indicators: Sore Pain Intervention(s): Monitored during session;Other (comment) (educ on gentle stretches)    Home Living                      Prior Function            PT Goals (current goals can now be found in the care plan section) Progress towards PT goals: Goals met/education completed, patient discharged from PT    Frequency    Min 3X/week      PT Plan Current plan remains appropriate    Co-evaluation              AM-PAC PT "6 Clicks" Mobility   Outcome Measure  Help needed turning from your back to your side while in a flat bed without using bedrails?: None Help needed moving from lying on your back to sitting on the side of a flat bed without using bedrails?: None Help needed moving to and from a bed to a chair (including a wheelchair)?: None Help needed standing up from a chair using your arms (e.g., wheelchair or bedside chair)?: None Help needed to walk in hospital room?: None Help needed climbing 3-5 steps with a railing? : None 6 Click Score: 24    End of Session Equipment Utilized During Treatment: Gait belt Activity Tolerance: Patient tolerated treatment well Patient left: in chair;with call bell/phone within reach Nurse Communication: Mobility status PT Visit Diagnosis: Other abnormalities of gait and mobility (R26.89);Unsteadiness on feet (R26.81);Difficulty in walking, not elsewhere classified (R26.2)     Time: 1443-9265 PT Time Calculation (min) (ACUTE ONLY): 30 min  Charges:  $Therapeutic Exercise: 8-22 mins $Self Care/Home Management: 8-22                     [name], PT, DPT Acute Rehabilitation Services  Pager [phone] Office [phone]  [name] 09/11/2021, 9:45 AM

## 2021-09-11 NOTE — Progress Notes (Signed)
HEMATOLOGY-ONCOLOGY PROGRESS NOTE  SUBJECTIVE: [name] tolerated chemotherapy well overall yesterday.  He reports some mild nausea today.  No vomiting.  Bowels are moving.  Has not yet taken his Zofran.  He also reports hiccups today.  REVIEW OF SYSTEMS:   Constitutional: Denies fevers, chills  Eyes: Denies blurriness of vision Ears, nose, mouth, throat, and face: Denies mucositis or sore throat Respiratory: Denies cough, dyspnea or wheezes Cardiovascular: Denies palpitation, chest discomfort Gastrointestinal: Reports nausea, denies vomiting Skin: Denies abnormal skin rashes Lymphatics: Denies new lymphadenopathy or easy bruising Neurological:Denies numbness, tingling or new weaknesses Behavioral/Psych: Mood is stable, no new changes  Extremities: No lower extremity edema All other systems were reviewed with the patient and are negative.  I have reviewed the past medical history, past surgical history, social history and family history with the patient and they are unchanged from previous note.   PHYSICAL EXAMINATION: ECOG PERFORMANCE STATUS: 2 - Symptomatic, <50% confined to bed  Vitals:   09/11/21 1400 09/11/21 2006  BP: 131/77 (!) 144/90  Pulse: 80 80  Resp: 18 16  Temp: 97.7 F (36.5 C) 97.6 F (36.4 C)  SpO2: 98% 99%   Filed Weights   09/07/21 1431 09/07/21 1615 09/11/21 0953  Weight: 158 lb 1.1 oz (71.7 kg) 156 lb 8.4 oz (71 kg) 166 lb 4.8 oz (75.4 kg)    Intake/Output from previous day: 09/19 0701 - 09/20 0700 In: -  Out: 8119 [Urine:2855]  GENERAL:alert, no distress and comfortable SKIN: skin color, texture, turgor are normal, no rashes or significant lesions EYES: normal, Conjunctiva are pink and non-injected, sclera clear OROPHARYNX:no exudate, no erythema and lips, buccal mucosa, and tongue normal  LUNGS: clear to auscultation and percussion with normal breathing effort HEART: regular rate & rhythm and no murmurs and no lower extremity  edema ABDOMEN:abdomen soft, non-tender and normal bowel sounds NEURO: alert & oriented x 3 with fluent speech, no focal motor/sensory deficits  LABORATORY DATA:  I have reviewed the data as listed CMP Latest Ref Rng & Units 09/11/2021 09/10/2021 09/09/2021  Glucose 70 - 99 mg/dL 139(H) 118(H) 226(H)  BUN 8 - 23 mg/dL 39(H) 41(H) 40(H)  Creatinine 0.61 - 1.24 mg/dL 4.90(H) 5.04(H) 5.43(H)  Sodium 135 - 145 mmol/L 131(L) 134(L) 130(L)  Potassium 3.5 - 5.1 mmol/L 4.1 4.1 4.1  Chloride 98 - 111 mmol/L 98 98 95(L)  CO2 22 - 32 mmol/L 22 21(L) 20(L)  Calcium 8.9 - 10.3 mg/dL 7.5(L) 7.8(L) 7.6(L)  Total Protein 6.5 - 8.1 g/dL - - -  Total Bilirubin 0.3 - 1.2 mg/dL - - -  Alkaline Phos 38 - 126 U/L - - -  AST 15 - 41 U/L - - -  ALT 0 - 44 U/L - - -    Lab Results  Component Value Date   WBC 10.6 (H) 09/11/2021   HGB 9.0 (L) 09/11/2021   HCT 28.5 (L) 09/11/2021   MCV 85.8 09/11/2021   PLT 419 (H) 09/11/2021   NEUTROABS 7.0 09/11/2021    CT ABDOMEN PELVIS WO CONTRAST  Result Date: 08/30/2021 CLINICAL DATA:  Retroperitoneal hematoma, follow up s/p random renal bx with perinephric hematoma development EXAM: CT ABDOMEN AND PELVIS WITHOUT CONTRAST TECHNIQUE: Multidetector CT imaging of the abdomen and pelvis was performed following the standard protocol without IV contrast. COMPARISON:  September 6 FINDINGS: Inferior chest: Trace bilateral pleural effusions with right greater than left compressive subsegmental atelectasis. Hepatobiliary: The liver is normal in size without focal abnormality. No intrahepatic or extrahepatic  biliary ductal dilation. The gallbladder appears normal. Spleen: Normal in size without focal abnormality. Pancreas: No pancreatic ductal dilatation or surrounding inflammatory changes. Adrenals/Urinary Tract: Adrenal glands are unremarkable. The kidneys are normal in size. Tiny perinephric hematoma along the right renal lower pole is unchanged. Hyperdense material in the right  collecting system and bladder consistent with blood products, grossly similar. Stomach/Bowel: The stomach, small bowel and large bowel are normal in caliber without abnormal wall thickening or surrounding inflammatory changes. Reproductive: Prostate is unremarkable. Lymphatic: No enlarged lymph nodes in the abdomen or pelvis. Vasculature: The abdominal aorta is normal in caliber. Aortic atherosclerosis. Other: No abdominopelvic ascites. Musculoskeletal: No aggressive osseous lesions. Degenerative changes at L5-S1. Bone island in the left ilium. IMPRESSION: The small perinephric hematoma along the right renal lower pole is stable, and within expected limits after percutaneous biopsy. However, there remains substantial clot burden within the bladder. Follow-up urology recommendations for management. Electronically Signed   By: Albin Felling M.D.   On: 08/30/2021 14:21   CT ABDOMEN PELVIS WO CONTRAST  Result Date: 08/29/2021 CLINICAL DATA:  Post right-sided renal biopsy, now with hematuria and hypotension. EXAM: CT ABDOMEN AND PELVIS WITHOUT CONTRAST TECHNIQUE: Multidetector CT imaging of the abdomen and pelvis was performed following the standard protocol without IV contrast. COMPARISON:  CT abdomen pelvis-08/23/2021; ultrasound-guided right renal biopsy-earlier same day FINDINGS: The lack of intravenous contrast limits the ability to evaluate solid abdominal organs. Lower chest: Limited visualization of the lower thorax demonstrates interval development of trace bilateral effusion with worsening bibasilar heterogeneous/consolidative opacities, right greater than left. Previously identified nonspecific ground-glass opacities within the imaged lung bases is not seen on the present examination though there is mild residual intraseptal thickening. Normal heart size. Trace amount of pericardial fluid, unchanged presumably physiologic. There is diffuse decreased attenuation intra cardiac blood pool suggestive of  anemia. Hepatobiliary: Normal hepatic contour. Apparent high density material within the gallbladder could represent biliary sludge. No definitive gallbladder wall thickening or pericholecystic stranding on this noncontrast examination. No ascites. Pancreas: Normal noncontrast appearance of the pancreas. Spleen: Normal noncontrast appearance of the spleen. Adrenals/Urinary Tract: There is a very tiny (approximately 2.7 x 1.6 x 1.2 cm) perinephric hematoma about the inferior pole of the right kidney (axial image 44, series 3; coronal image 57, series 6), with minimal amount of adjacent perinephric stranding. High-density material is seen within the right renal collecting system and ureter with moderate to large amount of layering high-density material within urinary bladder, findings compatible with hemorrhage into the collecting system and bladder. Mild associated right-sided pelviectasis and ureterectasis. Normal noncontrast appearance of the left kidney. No evidence of left-sided nephrolithiasis or urinary obstruction. Normal noncontrast appearance of the bilateral adrenal glands. Stomach/Bowel: Scattered minimal colonic diverticulosis without evidence of superimposed acute diverticulitis on this noncontrast examination. Normal appearance of the terminal ileum. The appendix is not visualized compatible with provided operative history. No discrete areas of bowel wall thickening on this noncontrast examination. No pneumoperitoneum, pneumatosis or portal venous gas. Vascular/Lymphatic: Moderate amount of atherosclerotic plaque within normal caliber abdominal aorta. Scattered retroperitoneal lymph nodes are numerous though individually not enlarged by size criteria with index left sided periaortic lymph node measuring 0.7 cm in greatest short axis diameter (image 31, series 3), presumably reactive in etiology. No bulky retroperitoneal, mesenteric, pelvic or inguinal lymphadenopathy on this noncontrast examination  Reproductive: Dystrophic calcifications within normal sized prostate gland. Trace amount of fluid within the pelvic cul-de-sac. Other: Small bilateral mesenteric fat containing inguinal hernias, left greater  than right. Minimal amount of subcutaneous edema about the midline of the low back. Presumed shrapnel is seen within the right lower abdominal/pelvic ventral abdominal wall. Musculoskeletal: No acute or aggressive osseous abnormalities. Mild-to-moderate multilevel lumbar spine DDD, worse at L4-L5 and L5-S1 with disc space height loss, endplate irregularity and small posteriorly directed disc osteophyte complexes at these locations. Mild degenerative change the bilateral hips with joint space loss, subchondral sclerosis and osteophytosis, right greater than left. IMPRESSION: 1. Post right-sided renal biopsy complicated by tiny (approximately 2.7 cm) perinephric hematoma and bleeding into the right renal collecting system including moderate to large-sized clot within the urinary bladder and associated mild right-sided pelviectasis and ureterectasis. Consideration for initiation of continuous bladder irrigation could be performed as indicated. 2. Trace bilateral effusions with associated bibasilar opacities, right greater than left, likely atelectasis. 3. Colonic diverticulosis without evidence superimposed acute diverticulitis. 4.  Aortic Atherosclerosis (ICD10-I70.0). Critical Value/emergent results were called by telephone at the time of interpretation on 08/28/2021 at 5:04 pm to provider [facility] , who verbally acknowledged these results. Electronically Signed   By: Sandi Mariscal M.D.   On: 08/29/2021 10:38   DG Chest 2 View  Result Date: 09/06/2021 CLINICAL DATA:  Fever EXAM: CHEST - 2 VIEW COMPARISON:  09/03/2021 FINDINGS: Right IJ dialysis catheter tip med right atrium. Midline trachea. Normal heart size. Left costophrenic angle minimally excluded the frontal radiograph. Small bilateral pleural  effusions. No pneumothorax. Improved interstitial edema with minimal pulmonary venous congestion remaining. Persistent mild bibasilar atelectasis. IMPRESSION: Improved interstitial edema with mild pulmonary venous congestion remaining. Small bilateral pleural effusions with adjacent atelectasis. Electronically Signed   By: Abigail Miyamoto M.D.   On: 09/06/2021 13:49   DG Chest 2 View  Result Date: 08/23/2021 CLINICAL DATA:  Near syncope. EXAM: CHEST - 2 VIEW COMPARISON:  None. FINDINGS: The lungs are clear without focal pneumonia, edema, pneumothorax or pleural effusion. Cardiopericardial silhouette is at upper limits of normal for size. The visualized bony structures of the thorax show no acute abnormality. Telemetry leads overlie the chest. IMPRESSION: No active cardiopulmonary disease. Electronically Signed   By: Misty Stanley M.D.   On: 08/23/2021 09:15   NM Pulmonary Perfusion  Result Date: 08/23/2021 CLINICAL DATA:  PE suspected, shortness of breath EXAM: NUCLEAR MEDICINE PERFUSION LUNG SCAN TECHNIQUE: Perfusion images were obtained in multiple projections after intravenous injection of radiopharmaceutical. Ventilation scans intentionally deferred if perfusion scan and chest x-ray adequate for interpretation during COVID 19 epidemic. RADIOPHARMACEUTICALS:  4.2 mCi Tc-64mMAA IV COMPARISON:  Same-day chest radiographs FINDINGS: Normal, homogeneous pulmonary perfusion. No suspicious filling defects. IMPRESSION: Very low probability examination for pulmonary embolism by modified perfusion only PIOPED criteria (PE absent). Electronically Signed   By: AEddie CandleM.D.   On: 08/23/2021 15:59   IR Angiogram Renal Left Selective  INDICATION: 70year old male with history of acute kidney injury of uncertain etiology status post ultrasound-guided right renal biopsy on 08/28/2021. Since biopsy, the patient has experienced gross hematuria with associated acute anemia.   EXAM: 1. Ultrasound-guided vascular access  of the right internal jugular vein. 2. Temporary hemodialysis catheter placement. 3. Ultrasound-guided vascular access of the right common femoral artery. 4. Selective catheterization and angiography of the right renal artery. 5. Sub selective catheterization angiography of right inferior polar and arcuate artery branches. 6. Coil embolization of right inferior pole arcuate artery branch.   MEDICATIONS: None.   ANESTHESIA/SEDATION: Moderate (conscious) sedation was employed during this procedure. A total of Versed 3 mg and  Fentanyl 50 mcg was administered intravenously.   Moderate Sedation Time: 66 minutes. The patient's level of consciousness and vital signs were monitored continuously by radiology nursing throughout the procedure under my direct supervision.   CONTRAST:  56m OMNIPAQUE IOHEXOL 350 MG/ML SOLN, 528mOMNIPAQUE IOHEXOL 350 MG/ML SOLN   FLUOROSCOPY TIME:  Fluoroscopy Time: 10.6 minutes, (811 mGy).   COMPLICATIONS: None immediate.   PROCEDURE: Informed consent was obtained from the patient following explanation of the procedure, risks, benefits and alternatives. The patient understands, agrees and consents for the procedure. All questions were addressed. A time out was performed prior to the initiation of the procedure. Maximal barrier sterile technique utilized including caps, mask, sterile gowns, sterile gloves, large sterile drape, hand hygiene, and chlorhexidine prep.   Preprocedure ultrasound evaluation of the right internal jugular vein demonstrated a patent and compressible vein free of internal echoes. Procedure was planned. Subdermal Local anesthesia was provided 1% lidocaine. A small skin nick was made. Under direct ultrasound visualization, a 21 gauge micropuncture needle was directed into the internal jugular vein. An image was captured and stored in the permanent record. A micropuncture set was inserted and exchanged for a J wire which was positioned in the inferior vena cava. Serial  dilation was performed followed by placement of a 12.5 French, 24 cm Trialysis catheter. The catheter tip was positioned in the right atrium. Each lumen flushed and aspirated appropriately. The dialysis ports were locked with appropriate volume of heparin dwell. The middle central venous port was then used for sedation for the remainder of the procedure. The catheter was secured with a 0 silk retention suture. A sterile bandage was applied.   Preprocedure ultrasound evaluation of the right groin was performed which demonstrated a patent right common femoral artery. The procedure was planned. Subdermal Local anesthesia was provided with 1% lidocaine. A small skin nick was made. Under direct ultrasound visualization, a 21 gauge micropuncture needle was directed into the common femoral artery. An ultrasound image was captured and stored in the permanent record. A micro puncture set was inserted and a limited right lower extremity angiogram was performed which demonstrated appropriate puncture site for closure device use. A J wire was directed to the abdominal aorta and the micropuncture set was exchanged for a 5 Fr[place] sheath. A 5 French C2 catheter was then directed into the right renal ostium. Right renal angiogram was performed. The single main renal artery was patent. 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   A straight lantern microcatheter and 0.014" soft synchro wire was then inserted and directed into the inferior polar branch. Repeat angiogram was performed in the sub selective location which was significant for multifocal 2-4 mm pseudoaneurysm formation, abnormal truncation and irregularity of the arcuate and interlobular branches, an early arteriovenous shunting. The inferior polar arcuate  branch was selected further. Coil embolization was performed with multiple low profile Penumbra Ruby coils ranging from 2-3 mm in diameter. Completion right inferior renal angiogram was performed which demonstrated appropriate embolization of the targeted vessels without persistent arteriovenous fistula or vessel irregularity.   The catheters were removed. The right common femoral artery was then closed with a 6 FrPakistanngio-Seal device. Distal pulses were unchanged. The patient tolerated the procedure well was transferred back to the floor in good condition.   IMPRESSION:  1. 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 2. Sub selective coil embolization of right inferior polar arcuate artery branch. 3. Successful placement of right internal jugular, 24 French Trialysis catheter with the catheter tip in the right atrium. The catheter is ready for immediate use.   Ruthann Cancer, MD   [facility]   [facility]     Electronically Signed   By: Ruthann Cancer M.D.   On: 08/31/2021 09:29    IR Fluoro Guide CV Line Right  Result Date: 08/31/2021 INDICATION: 70 year old male with history of acute kidney injury of uncertain etiology status post ultrasound-guided right renal biopsy on 08/28/2021. Since biopsy, the patient has experienced gross hematuria with associated acute anemia. EXAM: 1. Ultrasound-guided vascular access of the right internal jugular vein. 2. Temporary hemodialysis catheter placement. 3. Ultrasound-guided vascular access of the right common femoral artery. 4. Selective catheterization and angiography of the right renal artery. 5. Sub selective catheterization angiography of right inferior polar and arcuate artery branches. 6. Coil embolization of right inferior pole arcuate artery branch. MEDICATIONS: None.  ANESTHESIA/SEDATION: Moderate (conscious) sedation was employed during this procedure. A total of Versed 3 mg and Fentanyl 50 mcg was administered intravenously. Moderate Sedation Time: 66 minutes. The patient's level of consciousness and vital signs were monitored continuously by radiology nursing throughout the procedure under my direct supervision. CONTRAST:  52m OMNIPAQUE IOHEXOL 350 MG/ML SOLN, 537mOMNIPAQUE IOHEXOL 350 MG/ML SOLN FLUOROSCOPY TIME:  Fluoroscopy Time: 10.6 minutes, (811 mGy). COMPLICATIONS: None immediate. PROCEDURE: Informed consent was obtained from the patient following explanation of the procedure, risks, benefits and alternatives. The patient understands, agrees and consents for the procedure. All questions were addressed. A time out was performed prior to the initiation of the procedure. Maximal barrier sterile technique utilized including caps, mask, sterile gowns, sterile gloves, large sterile drape, hand hygiene, and chlorhexidine prep. Preprocedure ultrasound evaluation of the right internal jugular vein demonstrated a patent and compressible vein free of internal echoes. Procedure was planned. Subdermal Local anesthesia was provided 1% lidocaine. A small skin nick was made. Under direct ultrasound visualization, a 21 gauge micropuncture needle was directed into the internal jugular vein. An image was captured and stored in the permanent record. A micropuncture set was inserted and exchanged for a J wire which was positioned in the inferior vena cava. Serial dilation was performed followed by placement of a 12.5 French, 24 cm Trialysis catheter. The catheter tip was positioned in the right atrium. Each lumen flushed and aspirated appropriately. The dialysis ports were locked with appropriate volume of heparin dwell. The middle central venous port was then used for sedation for the remainder of the procedure. The catheter was secured with a 0 silk retention suture. A sterile bandage  was applied. Preprocedure ultrasound evaluation of the right groin was performed which demonstrated a patent right common femoral artery. The procedure was planned. Subdermal Local anesthesia was provided with 1% lidocaine. A small skin nick was made. Under direct ultrasound visualization, a 21 gauge micropuncture needle was directed into the common femoral artery. An ultrasound image was captured and stored in the permanent record. A micro puncture set was inserted and a limited right lower extremity angiogram was performed which demonstrated appropriate puncture site for closure device use. A J wire was directed to the abdominal aorta and the micropuncture set was exchanged for a 5 Fr[place]ascular  sheath. A 5 French C2 catheter was then directed into the right renal ostium. Right renal angiogram was performed. The single main renal artery was patent. 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 A straight lantern microcatheter and 0.014" soft synchro wire was then inserted and directed into the inferior polar branch. Repeat angiogram was performed in the sub selective location which was significant for multifocal 2-4 mm pseudoaneurysm formation, abnormal truncation and irregularity of the arcuate and interlobular branches, an early arteriovenous shunting. The inferior polar arcuate branch was selected further. Coil embolization was performed with multiple low profile Penumbra Ruby coils ranging from 2-3 mm in diameter. Completion right inferior renal angiogram was performed which demonstrated appropriate embolization of the targeted vessels without persistent arteriovenous fistula or vessel irregularity. The catheters were removed. The right common femoral artery was then closed with a 6 [place] Angio-Seal  device. Distal pulses were unchanged. The patient tolerated the procedure well was transferred back to the floor in good condition. IMPRESSION: 1. 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 2. Sub selective coil embolization of right inferior polar arcuate artery branch. 3. Successful placement of right internal jugular, 24 French Trialysis catheter with the catheter tip in the right atrium. The catheter is ready for immediate use. Ruthann Cancer, MD [facility] Electronically Signed   By: Ruthann Cancer M.D.   On: 08/31/2021 09:29   IR US Guide Vasc Access Right  Result Date: 08/31/2021 INDICATION: 70 year old male with history of acute kidney injury of uncertain etiology status post ultrasound-guided right renal biopsy on 08/28/2021. Since biopsy, the patient has experienced gross hematuria with associated acute anemia. EXAM: 1. Ultrasound-guided vascular access of the right internal jugular vein. 2. Temporary hemodialysis catheter placement. 3. Ultrasound-guided vascular access of the right common femoral artery. 4. Selective catheterization and angiography of the right renal artery. 5. Sub selective catheterization angiography of right inferior polar and arcuate artery branches. 6. Coil embolization of right inferior pole arcuate artery branch. MEDICATIONS: None. ANESTHESIA/SEDATION: Moderate (conscious) sedation was employed during this procedure. A total of Versed 3 mg and Fentanyl 50 mcg was administered intravenously. Moderate Sedation Time: 66 minutes. The patient's level of consciousness and vital signs were monitored continuously by radiology nursing throughout the procedure under my direct supervision. CONTRAST:  64m OMNIPAQUE IOHEXOL 350 MG/ML SOLN, 560mOMNIPAQUE IOHEXOL 350 MG/ML SOLN FLUOROSCOPY  TIME:  Fluoroscopy Time: 10.6 minutes, (811 mGy). COMPLICATIONS: None immediate. PROCEDURE: Informed consent was obtained from the patient following explanation of the procedure, risks, benefits and alternatives. The patient understands, agrees and consents for the procedure. All questions were addressed. A time out was performed prior to the initiation of the procedure. Maximal barrier sterile technique utilized including caps, mask, sterile gowns, sterile gloves, large sterile drape, hand hygiene, and chlorhexidine prep. Preprocedure ultrasound evaluation of the right internal jugular vein demonstrated a patent and compressible vein free of internal echoes. Procedure was planned. Subdermal Local anesthesia was provided 1% lidocaine. A small skin nick was made. Under direct ultrasound visualization, a 21 gauge micropuncture needle was directed into the internal jugular vein. An image was captured and stored in the permanent record. A micropuncture set was inserted and exchanged for  a J wire which was positioned in the inferior vena cava. Serial dilation was performed followed by placement of a 12.5 French, 24 cm Trialysis catheter. The catheter tip was positioned in the right atrium. Each lumen flushed and aspirated appropriately. The dialysis ports were locked with appropriate volume of heparin dwell. The middle central venous port was then used for sedation for the remainder of the procedure. The catheter was secured with a 0 silk retention suture. A sterile bandage was applied. Preprocedure ultrasound evaluation of the right groin was performed which demonstrated a patent right common femoral artery. The procedure was planned. Subdermal Local anesthesia was provided with 1% lidocaine. A small skin nick was made. Under direct ultrasound visualization, a 21 gauge micropuncture needle was directed into the common femoral artery. An ultrasound image was captured and stored in the permanent record. A micro puncture  set was inserted and a limited right lower extremity angiogram was performed which demonstrated appropriate puncture site for closure device use. A J wire was directed to the abdominal aorta and the micropuncture set was exchanged for a 5 [place] vascular sheath. A 5 French C2 catheter was then directed into the right renal ostium. Right renal angiogram was performed. The single main renal artery was patent. 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 A straight lantern microcatheter and 0.014" soft synchro wire was then inserted and directed into the inferior polar branch. Repeat angiogram was performed in the sub selective location which was significant for multifocal 2-4 mm pseudoaneurysm formation, abnormal truncation and irregularity of the arcuate and interlobular branches, an early arteriovenous shunting. The inferior polar arcuate branch was selected further. Coil embolization was performed with multiple low profile Penumbra Ruby coils ranging from 2-3 mm in diameter. Completion right inferior renal angiogram was performed which demonstrated appropriate embolization of the targeted vessels without persistent arteriovenous fistula or vessel irregularity. The catheters were removed. The right common femoral artery was then closed with a 6 [place] Angio-Seal device. Distal pulses were unchanged. The patient tolerated the procedure well was transferred back to the floor in good condition. IMPRESSION: 1. 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 2. Sub selective coil embolization of right inferior polar arcuate artery branch. 3. Successful placement of right  internal jugular, 24 French Trialysis catheter with the catheter tip in the right atrium. The catheter is ready for immediate use. Ruthann Cancer, MD [facility] Electronically Signed   By: Ruthann Cancer M.D.   On: 08/31/2021 09:29   IR US Guide Vasc Access Right  Result Date: 08/31/2021 INDICATION: 70 year old male with history of acute kidney injury of uncertain etiology status post ultrasound-guided right renal biopsy on 08/28/2021. Since biopsy, the patient has experienced gross hematuria with associated acute anemia. EXAM: 1. Ultrasound-guided vascular access of the right internal jugular vein. 2. Temporary hemodialysis catheter placement. 3. Ultrasound-guided vascular access of the right common femoral artery. 4. Selective catheterization and angiography of the right renal artery. 5. Sub selective catheterization angiography of right inferior polar and arcuate artery branches. 6. Coil embolization of right inferior pole arcuate artery branch. MEDICATIONS: None. ANESTHESIA/SEDATION: Moderate (conscious) sedation was employed during this procedure. A total of Versed 3 mg and  Fentanyl 50 mcg was administered intravenously. Moderate Sedation Time: 66 minutes. The patient's level of consciousness and vital signs were monitored continuously by radiology nursing throughout the procedure under my direct supervision. CONTRAST:  34m OMNIPAQUE IOHEXOL 350 MG/ML SOLN, 535mOMNIPAQUE IOHEXOL 350 MG/ML SOLN FLUOROSCOPY TIME:  Fluoroscopy Time: 10.6 minutes, (811 mGy). COMPLICATIONS: None immediate. PROCEDURE: Informed consent was obtained from the patient following explanation of the procedure, risks, benefits and alternatives. The patient understands, agrees and consents for the procedure. All questions were addressed. A time out was performed prior to the initiation of the procedure. Maximal barrier sterile technique utilized including caps, mask, sterile gowns,  sterile gloves, large sterile drape, hand hygiene, and chlorhexidine prep. Preprocedure ultrasound evaluation of the right internal jugular vein demonstrated a patent and compressible vein free of internal echoes. Procedure was planned. Subdermal Local anesthesia was provided 1% lidocaine. A small skin nick was made. Under direct ultrasound visualization, a 21 gauge micropuncture needle was directed into the internal jugular vein. An image was captured and stored in the permanent record. A micropuncture set was inserted and exchanged for a J wire which was positioned in the inferior vena cava. Serial dilation was performed followed by placement of a 12.5 French, 24 cm Trialysis catheter. The catheter tip was positioned in the right atrium. Each lumen flushed and aspirated appropriately. The dialysis ports were locked with appropriate volume of heparin dwell. The middle central venous port was then used for sedation for the remainder of the procedure. The catheter was secured with a 0 silk retention suture. A sterile bandage was applied. Preprocedure ultrasound evaluation of the right groin was performed which demonstrated a patent right common femoral artery. The procedure was planned. Subdermal Local anesthesia was provided with 1% lidocaine. A small skin nick was made. Under direct ultrasound visualization, a 21 gauge micropuncture needle was directed into the common femoral artery. An ultrasound image was captured and stored in the permanent record. A micro puncture set was inserted and a limited right lower extremity angiogram was performed which demonstrated appropriate puncture site for closure device use. A J wire was directed to the abdominal aorta and the micropuncture set was exchanged for a 5 [place] sheath. A 5 French C2 catheter was then directed into the right renal ostium. Right renal angiogram was performed. The single main renal artery was patent. About 2 arcuate artery branches in the  inferior pole there is abnormal truncation and vessel irregularity with evidence of an early filling arteriovenous fistula in addition to suggestion of faint filling into the collecting system on delayed imaging. A straight lantern microcatheter and 0.014" soft synchro wire was then inserted and directed into the inferior polar branch. Repeat angiogram was performed in the sub selective location which was significant for multifocal 2-4 mm pseudoaneurysm formation, abnormal truncation and irregularity of the arcuate and interlobular branches, an early arteriovenous shunting. The inferior polar arcuate branch was selected further. Coil embolization was performed with multiple low profile Penumbra Ruby coils ranging from 2-3 mm in diameter. Completion right inferior renal angiogram was performed which demonstrated appropriate embolization of the targeted vessels without persistent arteriovenous fistula or vessel irregularity. The catheters were removed. The right common femoral artery was then closed with a 6 Fr[place]-Seal device. Distal pulses were unchanged. The patient tolerated the procedure well was transferred back to the floor in good condition. IMPRESSION: 1. 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 2. Sub selective coil embolization of right inferior polar arcuate artery branch. 3. Successful placement of right internal jugular, 24 French Trialysis catheter with the catheter tip in the right atrium. The catheter is ready for immediate use. Ruthann Cancer, MD [facility] Electronically Signed   By: Ruthann Cancer M.D.   On: 08/31/2021 09:29   DG Chest Port 1 View  Result Date: 09/03/2021 CLINICAL DATA:  Shortness of breath EXAM: PORTABLE CHEST 1 VIEW COMPARISON:  08/23/2021 FINDINGS: Right dialysis  catheter in place with the tip in the right atrium. Heart is normal size. Mild vascular congestion and interstitial prominence throughout the lungs, likely mild edema. No effusions or acute bony abnormality. IMPRESSION: Suspect mild pulmonary edema. Electronically Signed   By: Rolm Baptise M.D.   On: 09/03/2021 09:40   DG Bone Survey Met  Result Date: 09/08/2021 CLINICAL DATA:  Left hip pain. EXAM: METASTATIC BONE SURVEY COMPARISON:  None. FINDINGS: No metastatic lesions identified. No cause for left hip pain identified. IMPRESSION: No acute abnormalities identified. No metastatic lesions. No cause for hip pain noted. Electronically Signed   By: Dorise Bullion III M.D.   On: 09/08/2021 14:32   MR CARDIAC MORPHOLOGY WO CONTRAST  Result Date: 09/10/2021 CLINICAL DATA:  Concern for cardiac amyloidosis. EXAM: CARDIAC MRI TECHNIQUE: The patient was scanned on a 1.5 Tesla GE magnet. A dedicated cardiac coil was used. Functional imaging was done using Fiesta sequences. 2,3, and 4 chamber views were done to assess for RWMA's. Modified Simpson's rule using a short axis stack was used to calculate an ejection fraction on a dedicated work Conservation officer, nature. T1 and T2 sequences done. No contrast due to AKI and unstable renal function. CONTRAST:  None FINDINGS: Limited images of the lung fields showed small bilateral pleural effusions. Small inferior pericardial effusion. Normal left ventricular size with moderate LV hypertrophy. Moderate diffuse hypokinesis with EF 38%. Normal right ventricular size with mild-moderate dysfunction, EF 36%. The aortic valve is trileaflet with no significant stenosis or regurgitation. Normal right atrial size. Mild left atrial enlargement. No significant mitral regurgitation. Measurements: T1 1217 T2 53 LVEDV 134 mL LVSV 51 mL LVEF 38% RVEDV 98 mL RVSV 35 mL RVEF 36% IMPRESSION: 1. Normal LV size with moderate LV hypertrophy. EF 38% with diffuse hypokinesis. 2.  Normal RV size  with EF 36%. 3.  T1 elevated, T2 borderline elevated. 4.  Small inferior pericardial effusion. 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 Dalton Mclean Electronically Signed   By: Loralie Champagne M.D.   On: 09/10/2021 17:55   CT BONE MARROW BIOPSY & ASPIRATION  Result Date: 09/05/2021 CLINICAL DATA:  Amyloid kidney EXAM: CT GUIDED DEEP ILIAC BONE ASPIRATION AND CORE BIOPSY TECHNIQUE: Patient was placed prone on the CT gantry and limited axial scans through the pelvis were obtained. Appropriate skin entry site was identified. Skin site was marked, prepped with chlorhexidine, draped in usual sterile fashion, and infiltrated locally with 1% lidocaine. Intravenous Fentanyl 79mg and Versed 255mwere administered as conscious sedation during continuous monitoring of the patient's level of consciousness and physiological / cardiorespiratory status by the radiology RN, with a total moderate sedation time of 10 minutes. Under CT fluoroscopic guidance an 11-gauge Cook trocar bone needle was advanced into the right iliac bone just lateral to the sacroiliac joint. Once  needle tip position was confirmed, core and aspiration samples were obtained, submitted to pathology for approval. Post procedure scans show no hematoma or fracture. Patient tolerated procedure well. COMPLICATIONS: COMPLICATIONS none IMPRESSION: 1. Technically successful CT guided right iliac bone core and aspiration biopsy. Electronically Signed   By: Lucrezia Europe M.D.   On: 09/05/2021 10:48   ECHOCARDIOGRAM COMPLETE  Result Date: 09/08/2021    ECHOCARDIOGRAM REPORT   Patient Name:   LINELL SHAWN Date of Exam: 09/08/2021 Medical Rec #:  [id number]    Height:       70.0 in Accession #:    [id number]   Weight:       156.5 lb Date of Birth:  July 28, 1951     BSA:          1.881 m Patient Age:    18 years     BP:           130/78 mmHg  Patient Gender: M            HR:           75 bpm. Exam Location:  Inpatient Procedure: 2D Echo, Cardiac Doppler and Color Doppler Indications:    Bacteremia R78.81  History:        Patient has prior history of Echocardiogram examinations, most                 recent 08/24/2021. Risk Factors:Current Smoker. Generalized                 weakness/shortness of breath-found to have AKI and severe                 normocytic anemia.  Sonographer:    Alvino Chapel RCS Referring Phys: [id number] [place]  1. Left ventricular ejection fraction, by estimation, is 45 to 50%. The left ventricle has mildly decreased function. The left ventricle demonstrates global hypokinesis. There is moderate concentric left ventricular hypertrophy. Left ventricular diastolic parameters are consistent with Grade II diastolic dysfunction (pseudonormalization).  2. Right ventricular systolic function is normal. The right ventricular size is normal. Tricuspid regurgitation signal is inadequate for assessing PA pressure.  3. Left atrial size was moderately dilated.  4. A small to moderate pericardial effusion is present. The pericardial effusion is anterior to the right ventricle. There is no evidence of cardiac tamponade.  5. The mitral valve is grossly normal. Mild mitral valve regurgitation.  6. The aortic valve is tricuspid. There is mild thickening of the aortic valve. Aortic valve regurgitation is not visualized.  7. The inferior vena cava is normal in size with greater than 50% respiratory variability, suggesting right atrial pressure of 3 mmHg.  8. No obvious valvular vegetations. Comparison(s): Prior images reviewed side by side. LVEF mildly reduced and pericardial effusion is new. FINDINGS  Left Ventricle: Left ventricular ejection fraction, by estimation, is 45 to 50%. The left ventricle has mildly decreased function. The left ventricle demonstrates global hypokinesis. The left ventricular internal cavity size was  normal in size. There is  moderate concentric left ventricular hypertrophy. Left ventricular diastolic parameters are consistent with Grade II diastolic dysfunction (pseudonormalization). Right Ventricle: The right ventricular size is normal. No increase in right ventricular wall thickness. Right ventricular systolic function is normal. Tricuspid regurgitation signal is inadequate for assessing PA pressure. Left Atrium: Left atrial size was moderately dilated. Right Atrium: Right atrial size was normal in size. Pericardium: A small pericardial effusion is present. The pericardial effusion is anterior  to the right ventricle. There is no evidence of cardiac tamponade. Mitral Valve: The mitral valve is grossly normal. There is mild thickening of the mitral valve leaflet(s). Mild mitral annular calcification. Mild mitral valve regurgitation. Tricuspid Valve: The tricuspid valve is grossly normal. Tricuspid valve regurgitation is trivial. Aortic Valve: The aortic valve is tricuspid. There is mild thickening of the aortic valve. There is mild to moderate aortic valve annular calcification. Aortic valve regurgitation is not visualized. Pulmonic Valve: The pulmonic valve was grossly normal. Pulmonic valve regurgitation is trivial. Aorta: The aortic root is normal in size and structure. Venous: The inferior vena cava is normal in size with greater than 50% respiratory variability, suggesting right atrial pressure of 3 mmHg. IAS/Shunts: No atrial level shunt detected by color flow Doppler.  LEFT VENTRICLE PLAX 2D LVIDd:         5.10 cm  Diastology LVIDs:         3.90 cm  LV e' medial:    3.77 cm/s LV PW:         1.50 cm  LV E/e' medial:  22.9 LV IVS:        1.40 cm  LV e' lateral:   7.18 cm/s LVOT diam:     2.00 cm  LV E/e' lateral: 12.0 LV SV:         51 LV SV Index:   27 LVOT Area:     3.14 cm  RIGHT VENTRICLE RV S prime:     14.40 cm/s TAPSE (M-mode): 1.9 cm LEFT ATRIUM              Index       RIGHT ATRIUM            Index LA diam:        4.20 cm  2.23 cm/m  RA Area:     14.50 cm LA Vol (A2C):   104.0 ml 55.29 ml/m RA Volume:   36.00 ml  19.14 ml/m LA Vol (A4C):   64.4 ml  34.24 ml/m LA Biplane Vol: 83.7 ml  44.50 ml/m  AORTIC VALVE LVOT Vmax:   89.00 cm/s LVOT Vmean:  53.600 cm/s LVOT VTI:    0.161 m  AORTA Ao Root diam: 3.50 cm MITRAL VALVE MV Area (PHT): 3.65 cm    SHUNTS MV Decel Time: 208 msec    Systemic VTI:  0.16 m MV E velocity: 86.40 cm/s  Systemic Diam: 2.00 cm MV A velocity: 63.70 cm/s MV E/A ratio:  1.36 Rozann Lesches MD Electronically signed by Rozann Lesches MD Signature Date/Time: 09/08/2021/5:26:36 PM    Final    ECHOCARDIOGRAM COMPLETE  Result Date: 08/24/2021    ECHOCARDIOGRAM REPORT   Patient Name:   LENZIE SANDLER Date of Exam: 08/24/2021 Medical Rec #:  [id number]    Height:       70.0 in Accession #:    [id number]   Weight:       180.0 lb Date of Birth:  [birth year]     BSA:          1.996 m Patient Age:    89 years     BP:           120/69 mmHg Patient Gender: M            HR:           85 bpm. Exam Location:  Inpatient Procedure: 2D Echo, Cardiac Doppler and Color Doppler Indications:    Abnormal EKG  History:  Patient has no prior history of Echocardiogram examinations.                 COPD, Signs/Symptoms:Dyspnea and Weakness, renal disease,                 anemia, elevated troponin; Risk Factors:Current Smoker.  Sonographer:    Dustin Flock RDCS Referring Phys: [place]  Sonographer Comments: Image acquisition challenging due to COPD. IMPRESSIONS  1. Left ventricular ejection fraction, by estimation, is 55 to 60%. The left ventricle has normal function. The left ventricle has no regional wall motion abnormalities. There is mild concentric left ventricular hypertrophy. Left ventricular diastolic parameters are consistent with Grade I diastolic dysfunction (impaired relaxation).  2. Right ventricular systolic function is normal. The right ventricular size is normal.  Tricuspid regurgitation signal is inadequate for assessing PA pressure.  3. The mitral valve is grossly normal. No evidence of mitral valve regurgitation. No evidence of mitral stenosis.  4. The aortic valve was not well visualized. Aortic valve regurgitation is not visualized. No aortic stenosis is present.  5. The inferior vena cava is normal in size with greater than 50% respiratory variability, suggesting right atrial pressure of 3 mmHg. Comparison(s): No prior Echocardiogram. FINDINGS  Left Ventricle: Left ventricular ejection fraction, by estimation, is 55 to 60%. The left ventricle has normal function. The left ventricle has no regional wall motion abnormalities. The left ventricular internal cavity size was normal in size. There is  mild concentric left ventricular hypertrophy. Left ventricular diastolic parameters are consistent with Grade I diastolic dysfunction (impaired relaxation). Right Ventricle: The right ventricular size is normal. No increase in right ventricular wall thickness. Right ventricular systolic function is normal. Tricuspid regurgitation signal is inadequate for assessing PA pressure. Left Atrium: Left atrial size was normal in size. Right Atrium: Right atrial size was normal in size. Pericardium: There is no evidence of pericardial effusion. Mitral Valve: The mitral valve is grossly normal. No evidence of mitral valve regurgitation. No evidence of mitral valve stenosis. Tricuspid Valve: The tricuspid valve is normal in structure. Tricuspid valve regurgitation is not demonstrated. No evidence of tricuspid stenosis. Aortic Valve: The aortic valve was not well visualized. Aortic valve regurgitation is not visualized. No aortic stenosis is present. Pulmonic Valve: The pulmonic valve was not well visualized. Pulmonic valve regurgitation is not visualized. Aorta: The aortic root is normal in size and structure. Venous: The inferior vena cava is normal in size with greater than 50% respiratory  variability, suggesting right atrial pressure of 3 mmHg. IAS/Shunts: The atrial septum is grossly normal.  LEFT VENTRICLE PLAX 2D LVIDd:         5.80 cm      Diastology LVIDs:         3.60 cm      LV e' medial:    5.55 cm/s LV PW:         1.30 cm      LV E/e' medial:  14.5 LV IVS:        1.30 cm      LV e' lateral:   6.74 cm/s LVOT diam:     2.40 cm      LV E/e' lateral: 12.0 LV SV:         76 LV SV Index:   38 LVOT Area:     4.52 cm  LV Volumes (MOD) LV vol d, MOD A4C: 124.0 ml LV vol s, MOD A4C: 47.8 ml LV SV MOD A4C:  124.0 ml RIGHT VENTRICLE RV Basal diam:  2.80 cm RV S prime:     10.10 cm/s TAPSE (M-mode): 2.9 cm LEFT ATRIUM             Index       RIGHT ATRIUM           Index LA diam:        3.80 cm 1.90 cm/m  RA Area:     13.10 cm LA Vol (A2C):   31.8 ml 15.93 ml/m RA Volume:   29.70 ml  14.88 ml/m LA Vol (A4C):   33.0 ml 16.53 ml/m LA Biplane Vol: 34.9 ml 17.49 ml/m  AORTIC VALVE LVOT Vmax:   98.30 cm/s LVOT Vmean:  60.300 cm/s LVOT VTI:    0.169 m  AORTA Ao Root diam: 3.30 cm MITRAL VALVE MV Area (PHT): 3.85 cm    SHUNTS MV Decel Time: 197 msec    Systemic VTI:  0.17 m MV E velocity: 80.60 cm/s  Systemic Diam: 2.40 cm MV A velocity: 49.30 cm/s MV E/A ratio:  1.63 Rudean Haskell MD Electronically signed by Rudean Haskell MD Signature Date/Time: 08/24/2021/11:38:40 AM    Final    CT Renal Stone Study  Result Date: 08/23/2021 CLINICAL DATA:  Hematuria, new renal failure EXAM: CT ABDOMEN AND PELVIS WITHOUT CONTRAST TECHNIQUE: Multidetector CT imaging of the abdomen and pelvis was performed following the standard protocol without IV contrast. COMPARISON:  06/26/2007 FINDINGS: Lower chest: There is mild, scattered, nonspecific ground-glass and fine nodularity throughout the included bilateral lung bases (series 5, 4). Hepatobiliary: No solid liver abnormality is seen. No gallstones, gallbladder wall thickening, or biliary dilatation. Pancreas: Unremarkable. No pancreatic ductal dilatation  or surrounding inflammatory changes. Spleen: Normal in size without significant abnormality. Adrenals/Urinary Tract: Adrenal glands are unremarkable. Kidneys are normal, without renal calculi, solid lesion, or hydronephrosis. Distended urinary bladder, measuring at least 17.4 cm. Stomach/Bowel: Stomach is within normal limits. Appendix not clearly visualized and may be surgically. No evidence of bowel wall thickening, distention, or inflammatory changes. Descending and sigmoid diverticulosis. Vascular/Lymphatic: Aortic atherosclerosis. No enlarged abdominal or pelvic lymph nodes. Reproductive: No mass or other significant abnormality. Other: Small, fat containing bilateral inguinal hernias no abdominopelvic ascites. Musculoskeletal: No acute or significant osseous findings. IMPRESSION: 1. No evidence of urinary tract calculus or hydronephrosis. 2. Distended urinary bladder, measuring at least 17.4 cm. Correlate for urinary retention. 3. Descending and sigmoid diverticulosis without evidence of acute diverticulitis. 4. Mild, nonspecific scattered ground-glass and fine nodularity throughout the bilateral lung bases, possibly infectious or inflammatory. Aortic Atherosclerosis (ICD10-I70.0). Electronically Signed   By: Eddie Candle M.D.   On: 08/23/2021 16:04   US BIOPSY (KIDNEY)  Result Date: 08/28/2021 INDICATION: Acute kidney injury of uncertain etiology. Please perform image guided biopsy for tissue diagnostic purposes. EXAM: ULTRASOUND GUIDED RENAL BIOPSY COMPARISON:  CT abdomen and pelvis-08/23/2021 MEDICATIONS: None. ANESTHESIA/SEDATION: Fentanyl 100 mcg IV; Versed 2 mg IV Total Moderate Sedation time: 14 minutes; The patient was continuously monitored during the procedure by the interventional radiology nurse under my direct supervision. COMPLICATIONS: None immediate. PROCEDURE: Informed written consent was obtained from the patient after a discussion of the risks, benefits and alternatives to treatment. The  patient understands and consents the procedure. A timeout was performed prior to the initiation of the procedure. Ultrasound scanning was performed of the bilateral flanks. The inferior pole of the right kidney was selected for biopsy due to location and sonographic window. The procedure was planned. The operative site was  prepped and draped in the usual sterile fashion. The overlying soft tissues were anesthetized with 1% lidocaine with epinephrine. A 17 gauge core needle biopsy device was advanced into the inferior cortex of the right kidney and 3 core biopsies were obtained under direct ultrasound guidance. Images were saved for documentation purposes. The biopsy device was removed and hemostasis was obtained with manual compression. Post procedural scanning was negative for significant post procedural hemorrhage or additional complication. A dressing was placed. The patient tolerated the procedure well without immediate post procedural complication. IMPRESSION: Technically successful ultrasound guided right renal biopsy. Electronically Signed   By: Sandi Mariscal M.D.   On: 08/28/2021 12:52   IR EMBO ART  VEN HEMORR LYMPH EXTRAV  INC GUIDE ROADMAPPING  Result Date: 08/31/2021 INDICATION: 70 year old male with history of acute kidney injury of uncertain etiology status post ultrasound-guided right renal biopsy on 08/28/2021. Since biopsy, the patient has experienced gross hematuria with associated acute anemia. EXAM: 1. Ultrasound-guided vascular access of the right internal jugular vein. 2. Temporary hemodialysis catheter placement. 3. Ultrasound-guided vascular access of the right common femoral artery. 4. Selective catheterization and angiography of the right renal artery. 5. Sub selective catheterization angiography of right inferior polar and arcuate artery branches. 6. Coil embolization of right inferior pole arcuate artery branch. MEDICATIONS: None. ANESTHESIA/SEDATION: Moderate (conscious) sedation was  employed during this procedure. A total of Versed 3 mg and Fentanyl 50 mcg was administered intravenously. Moderate Sedation Time: 66 minutes. The patient's level of consciousness and vital signs were monitored continuously by radiology nursing throughout the procedure under my direct supervision. CONTRAST:  16m OMNIPAQUE IOHEXOL 350 MG/ML SOLN, 510mOMNIPAQUE IOHEXOL 350 MG/ML SOLN FLUOROSCOPY TIME:  Fluoroscopy Time: 10.6 minutes, (811 mGy). COMPLICATIONS: None immediate. PROCEDURE: Informed consent was obtained from the patient following explanation of the procedure, risks, benefits and alternatives. The patient understands, agrees and consents for the procedure. All questions were addressed. A time out was performed prior to the initiation of the procedure. Maximal barrier sterile technique utilized including caps, mask, sterile gowns, sterile gloves, large sterile drape, hand hygiene, and chlorhexidine prep. Preprocedure ultrasound evaluation of the right internal jugular vein demonstrated a patent and compressible vein free of internal echoes. Procedure was planned. Subdermal Local anesthesia was provided 1% lidocaine. A small skin nick was made. Under direct ultrasound visualization, a 21 gauge micropuncture needle was directed into the internal jugular vein. An image was captured and stored in the permanent record. A micropuncture set was inserted and exchanged for a J wire which was positioned in the inferior vena cava. Serial dilation was performed followed by placement of a 12.5 French, 24 cm Trialysis catheter. The catheter tip was positioned in the right atrium. Each lumen flushed and aspirated appropriately. The dialysis ports were locked with appropriate volume of heparin dwell. The middle central venous port was then used for sedation for the remainder of the procedure. The catheter was secured with a 0 silk retention suture. A sterile bandage was applied. Preprocedure ultrasound evaluation of the  right groin was performed which demonstrated a patent right common femoral artery. The procedure was planned. Subdermal Local anesthesia was provided with 1% lidocaine. A small skin nick was made. Under direct ultrasound visualization, a 21 gauge micropuncture needle was directed into the common femoral artery. An ultrasound image was captured and stored in the permanent record. A micro puncture set was inserted and a limited right lower extremity angiogram was performed which demonstrated appropriate puncture site for closure device  use. A J wire was directed to the abdominal aorta and the micropuncture set was exchanged for a 5 [place] vascular sheath. A 5 French C2 catheter was then directed into the right renal ostium. Right renal angiogram was performed. The single main renal artery was patent. 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 A straight lantern microcatheter and 0.014" soft synchro wire was then inserted and directed into the inferior polar branch. Repeat angiogram was performed in the sub selective location which was significant for multifocal 2-4 mm pseudoaneurysm formation, abnormal truncation and irregularity of the arcuate and interlobular branches, an early arteriovenous shunting. The inferior polar arcuate branch was selected further. Coil embolization was performed with multiple low profile Penumbra Ruby coils ranging from 2-3 mm in diameter. Completion right inferior renal angiogram was performed which demonstrated appropriate embolization of the targeted vessels without persistent arteriovenous fistula or vessel irregularity. The catheters were removed. The right common femoral artery was then closed with a 6 [place] Angio-Seal device. Distal pulses were unchanged. The patient tolerated  the procedure well was transferred back to the floor in good condition. IMPRESSION: 1. 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 2. Sub selective coil embolization of right inferior polar arcuate artery branch. 3. Successful placement of right internal jugular, 24 French Trialysis catheter with the catheter tip in the right atrium. The catheter is ready for immediate use. Ruthann Cancer, MD [facility] Radiology Electronically Signed   By: Ruthann Cancer M.D.   On: 08/31/2021 09:29    Surgical Pathology  CASE: [id number]  PATIENT: Mase Agostino  Bone Marrow Report   Clinical History: Amyloid , right Iliac (BH)   DIAGNOSIS:   BONE MARROW, ASPIRATE, CLOT, CORE:  - Plasma cell myeloma, see comment.   PERIPHERAL BLOOD:  - Normocytic anemia.   COMMENT:   The marrow is normocellular but exhibits increased monoclonal plasma  cells (17% aspirate, 15-20% CD138 immunohistochemistry). The findings  are consistent with plasma cell myeloma. [place] red is pending and will  be reported in an addendum. There is some atypia in the megakaryocytes  and FISH for MDS was added for completeness.   MICROSCOPIC DESCRIPTION:   PERIPHERAL BLOOD SMEAR: There is a normocytic anemia with occasional  hypochromic cells.  There is no rouleaux formation.  Leukocytes are  present in normal numbers.  Circulating plasma cells are not identified.  Platelets are present in normal numbers.   BONE MARROW ASPIRATE: Spicular and cellular.  Erythroid precursors: Relative decrease in numbers.  No significant  dysplasia.  Granulocytic precursors: Relative increase in numbers.  No significant  dysplasia.  No increase in blasts.  Megakaryocytes: Mild increase in numbers.  Occasional forms with  hypolobated or abnormal nuclei.   Lymphocytes/plasma cells: Plasma cells are increased in numbers (6% by  manual differential counts) with atypical forms (multinucleation, large  forms).  Lymphocytes are not increased.   TOUCH PREPARATIONS: Similar to aspirate smears.   CLOT AND BIOPSY: The core biopsy and clot section are normocellular for  age (30%).  There is a mild myeloid hyperplasia.  Megakaryocytes are  increased in numbers with scattered atypical forms. CD138  immunohistochemistry reveals increased plasma cells (15-20%) which are  scattered  and with small clusters. By light chain in situ hybridization  the plasma cells are lambda restricted.   IRON STAIN: Iron stains are performed on a bone marrow aspirate or touch  imprint smear and section of clot. The controls stained appropriately.        Storage Iron: Present       Ring Sideroblasts: Absent   ADDITIONAL DATA/TESTING: Cytogenetics, including FISH for myeloma and  MDS, was ordered and will be reported in an addendum.   CELL COUNT DATA:   Bone Marrow count performed on 500 cells shows:  Blasts:   0%   Myeloid:  66%  Promyelocytes: 0%   Erythroid:     11%  Myelocytes:    8%   Lymphocytes:   6%  Metamyelocytes:     1%   Plasma cells:  17%  Bands:    8%  Neutrophils:   41%  M:E ratio:     6.0  Eosinophils:   8%  Basophils:     0%  Monocytes:     0%   Lab Data: CBC performed on 09/05/21 shows:  WBC: 8.5 k/uL  Neutrophils:   57%  Hgb: 9.6 g/dL  Lymphocytes:   27%  HCT: 30.3 %    Monocytes:     8%  MCV: 84.6 fL   Eosinophils:   6%  RDW: 17.4 %    Basophils:     2%  PLT: 380 k/uL    ASSESSMENT AND PLAN: 1.  AL amyloidosis/plasma cell myeloma 2.  Anemia secondary to renal insufficiency, iron deficiency, and folate deficiency 3.  Acute kidney injury secondary to #1 4.  History of nephrolithiasis 5.  Streptococcus agalactiae bacteremia diagnosed 09/07/2021   -The patient is status post day 1 of cycle 1 of CyBorD.  Tolerated well the exception of  mild nausea today.  He was encouraged to use as needed Zofran for the nausea.  Plan is to administer weekly chemotherapy.  Next dose is due 09/17/2021.  If he remains inpatient, will administer here in the hospital, but if he is discharged prior to this date, will arrange for outpatient administration in our office.  However, it appears that he will require IV antibiotics through 09/16/2021 for bacteremia.  Will likely receive cycle 1 day 8 of chemotherapy inpatient and then discharge. -Creatinine mildly improved today.  Continue to monitor.  Nephrology is following. -Cardiac MRI suggestive of cardiac involvement by AL amyloidosis.  Cardiology following.  We will plan to continue chemotherapy as outlined above. -Continue acyclovir 200 mg every 12 hours.  Renally dosed. -His hemoglobin remains stable.  He is status post 4 doses of IV ferric gluconate.  He is also receiving folic acid.  Transfuse for hemoglobin less than 7.5.   LOS: 19 days   Truitt Merle, DNP, AGPCNP-BC, AOCNP 09/11/21  Addendum  I have seen the patient, examined him. I agree with the assessment and and plan and have edited the notes.   Pt felt well this morning but developed nausea. He took zofran which helped. This is likely related to chemo. I reviewed diet and antiemetics, will add compazine as needed for nausea also. 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 Given his multiple organ involvement and dysfunction, his overall prognosis is poor. He is not a candidate for BM transplant. Continue monitoring. Will f/u. Next chemo 9/26. I called his son and left a VM for updates.   Truitt Merle  09/11/2021

## 2021-09-11 NOTE — TOC Initial Note (Addendum)
Transition of Care [facility]) - Initial/Assessment Note    Patient Details  Name: Caleb Simmons MRN: [id number] Date of Birth: 30-Sep-1951  [facility] CM/SW Contact:    Caleb Simmons, Caleb Simmons Phone Number: 09/11/2021, 1:32 PM  Clinical Narrative:                 [facility] CSW attempted to speak with the patient at bedside to complete a brief SDOH however unable to enter the room due to a sign on his door regarding chemotherapy. CSW attempted to outreach the patient over the phone and spoke with his daughter in law [phone] who reported that Caleb Simmons lives alone and that his wife passed away from cancer last year and that he still works and drives. His daughter in law did not identify any social needs at this time for Caleb Simmons. The daughter in law reported that Caleb Simmons needs FMLA paperwork filled out for his employer and will get that to the CSW to fill out sometime this week.   CSW will continue to follow throughout discharge.  Expected Discharge Plan: Home/Self Care Barriers to Discharge: Continued Medical Work up   Patient Goals and CMS Choice        Expected Discharge Plan and Services Expected Discharge Plan: Home/Self Care In-house Referral: Clinical Social Work Discharge Planning Services: CM Consult   Living arrangements for the past 2 months: Single Family Home                                      Prior Living Arrangements/Services Living arrangements for the past 2 months: Single Family Home Lives with:: Self Patient language and need for interpreter reviewed:: Yes        Need for Family Participation in Patient Care: No (Comment) Care giver support system in place?: No (comment)   Criminal Activity/Legal Involvement Pertinent to Current Situation/Hospitalization: No - Comment as needed  Activities of Daily Living Home Assistive Devices/Equipment: None ADL Screening (condition at time of admission) Patient's cognitive ability adequate to safely  complete daily activities?: Yes Is the patient deaf or have difficulty hearing?: Yes Does the patient have difficulty seeing, even when wearing glasses/contacts?: No Does the patient have difficulty concentrating, remembering, or making decisions?: No Patient able to express need for assistance with ADLs?: No Does the patient have difficulty dressing or bathing?: No Independently performs ADLs?: Yes (appropriate for developmental age) Does the patient have difficulty walking or climbing stairs?: Yes Weakness of Legs: None Weakness of Arms/Hands: None  Permission Sought/Granted            Permission granted to share info w Relationship: daughter in law  Permission granted to share info w Contact Information: [phone]  Emotional Assessment Appearance:: Other (Comment Required (unable to go in room due to chemotherapy note on patients door) Attitude/Demeanor/Rapport: Unable to Assess Affect (typically observed): Unable to Assess Orientation: : Oriented to Self, Oriented to Place, Oriented to  Time, Oriented to Situation Alcohol / Substance Use: Not Applicable Psych Involvement: No (comment)  Admission diagnosis:  Elevated troponin [R77.8] AKI (acute kidney injury) ([place]) [N17.9] Symptomatic anemia [D64.9] Patient Active Problem List   Diagnosis Date Noted   Bacteremia due to group B Streptococcus 09/10/2021   Acute kidney injury ([place])    Amyloidosis ([place])    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  Jackob Crookston, MSW, [place] [facility] Failure Social Worker

## 2021-09-12 ENCOUNTER — Encounter (HOSPITAL_COMMUNITY): Payer: Self-pay | Admitting: Oncology

## 2021-09-12 DIAGNOSIS — N179 Acute kidney failure, unspecified: Secondary | ICD-10-CM | POA: Diagnosis not present

## 2021-09-12 DIAGNOSIS — E854 Organ-limited amyloidosis: Secondary | ICD-10-CM | POA: Diagnosis not present

## 2021-09-12 DIAGNOSIS — R7881 Bacteremia: Secondary | ICD-10-CM | POA: Diagnosis not present

## 2021-09-12 DIAGNOSIS — E8581 Light chain (AL) amyloidosis: Secondary | ICD-10-CM | POA: Diagnosis not present

## 2021-09-12 LAB — CBC WITH DIFFERENTIAL/PLATELET
Abs Immature Granulocytes: 0.15 10*3/uL — ABNORMAL HIGH (ref 0.00–0.07)
Basophils Absolute: 0.1 10*3/uL (ref 0.0–0.1)
Basophils Relative: 1 %
Eosinophils Absolute: 0.4 10*3/uL (ref 0.0–0.5)
Eosinophils Relative: 4 %
HCT: 30.4 % — ABNORMAL LOW (ref 39.0–52.0)
Hemoglobin: 9.6 g/dL — ABNORMAL LOW (ref 13.0–17.0)
Immature Granulocytes: 1 %
Lymphocytes Relative: 26 %
Lymphs Abs: 2.7 10*3/uL (ref 0.7–4.0)
MCH: 26.8 pg (ref 26.0–34.0)
MCHC: 31.6 g/dL (ref 30.0–36.0)
MCV: 84.9 fL (ref 80.0–100.0)
Monocytes Absolute: 0.8 10*3/uL (ref 0.1–1.0)
Monocytes Relative: 8 %
Neutro Abs: 6.5 10*3/uL (ref 1.7–7.7)
Neutrophils Relative %: 60 %
Platelets: 473 10*3/uL — ABNORMAL HIGH (ref 150–400)
RBC: 3.58 MIL/uL — ABNORMAL LOW (ref 4.22–5.81)
RDW: 17.8 % — ABNORMAL HIGH (ref 11.5–15.5)
WBC: 10.7 10*3/uL — ABNORMAL HIGH (ref 4.0–10.5)
nRBC: 0 % (ref 0.0–0.2)

## 2021-09-12 LAB — RENAL FUNCTION PANEL
Albumin: 1.6 g/dL — ABNORMAL LOW (ref 3.5–5.0)
Anion gap: 11 (ref 5–15)
BUN: 39 mg/dL — ABNORMAL HIGH (ref 8–23)
CO2: 22 mmol/L (ref 22–32)
Calcium: 7.9 mg/dL — ABNORMAL LOW (ref 8.9–10.3)
Chloride: 101 mmol/L (ref 98–111)
Creatinine, Ser: 5.09 mg/dL — ABNORMAL HIGH (ref 0.61–1.24)
GFR, Estimated: 11 mL/min — ABNORMAL LOW (ref >60)
Glucose, Bld: 96 mg/dL (ref 70–99)
Phosphorus: 3.6 mg/dL (ref 2.5–4.6)
Potassium: 4.6 mmol/L (ref 3.5–5.1)
Sodium: 134 mmol/L — ABNORMAL LOW (ref 135–145)

## 2021-09-12 NOTE — Progress Notes (Signed)
[facility] PROGRESS NOTE    Progress Note  Caleb Simmons  HCW:[id number] DOB: [birth year] DOA: 08/23/2021 PCP: Pcp, No     Brief Narrative:   Caleb Simmons is an 70 y.o. male past medical history of nephrolithiasis with no other medical problems, but he does not see a primary care doctor regularly comes into the ED complaining of weakness was found to be in acute kidney injury and severe normocytic anemia.  Seen by nephrology who recommended renal biopsy done on 08/28/2021 unfortunately developed post renal bleeding with clots requiring an insertion of a CBI he is status post 2 units of packed red blood cells but he continued to bleed so IR performed embolization on 08/30/2021, renal biopsy showed amyloidosis.  Oncology has been consulted.  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 The plan is to continue to administer weekly chemotherapy next 1 due on 09/17/2021. Oncology recommended to start him on acyclovir every 12 hours.  AKI (acute kidney injury) ([place]) secondary to amyloid disease: Urine output is picking up yesterday he had about 3 L of urine output.  Creatinine remains at 5. Nephrology is on board last HD treatment on 09/07/2021. Oncology has also been consulted for plasma cell myeloma and started on treatment on  09/10/2021 Continue daily HD per renal.  Sepsis secondary to strep recollected bacteremia: 2D echo was done that showed no vegetation, but it did show a small drop in his EF. Initially was on Vanco and cefepime and transition to IV penicillin. ID was consulted who recommended treatment for total 10 days, last day on 09/16/2021.  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 The advanced heart failure team was consulted they recommended a cardiac MRI on 09/10/2021 was suggestive of cardiac amyloid, Cardiology recommended chemotherapy, oncology is on board. No diuretics needed at this time.  Gross hematuria/acute urinary retention due to bleeding in the collecting duct of the right kidney post renal biopsy: Foley was discontinued 08/30/2021. He required multiple transfusions. Status post embolization on 08/30/2021. Further management per urology.  Anxiety: Continue Xanax aware that we will not continue Xanax postdischarge, continue trazodone. He is not interested in other medications like SSRI.  Tobacco abuse: Continue nicotine patch.   DVT prophylaxis: lovenxo Family Communication:noen Status is: Inpatient  Remains inpatient appropriate because:Hemodynamically unstable  Dispo: The patient is from: Home              Anticipated d/c is to: Home              Patient currently is not medically stable to d/c.   Difficult to place patient No   Code Status:     Code Status Orders  (From admission, onward)  Start     Ordered   08/23/21 1752  Full code  Continuous        08/23/21 1751           Code Status History     This patient has a current code status but no historical code status.         IV Access:   Peripheral IV   Procedures and diagnostic studies:   MR CARDIAC MORPHOLOGY WO  CONTRAST  Result Date: 09/10/2021 CLINICAL DATA:  Concern for cardiac amyloidosis. EXAM: CARDIAC MRI TECHNIQUE: The patient was scanned on a 1.5 Tesla GE magnet. A dedicated cardiac coil was used. Functional imaging was done using Fiesta sequences. 2,3, and 4 chamber views were done to assess for RWMA's. Modified Simpson's rule using a short axis stack was used to calculate an ejection fraction on a dedicated work Conservation officer, nature. T1 and T2 sequences done. No contrast due to AKI and unstable renal function. CONTRAST:  None FINDINGS: Limited images of the lung fields showed small bilateral pleural effusions. Small inferior pericardial effusion. Normal left ventricular size with moderate LV hypertrophy. Moderate diffuse hypokinesis with EF 38%. Normal right ventricular size with mild-moderate dysfunction, EF 36%. The aortic valve is trileaflet with no significant stenosis or regurgitation. Normal right atrial size. Mild left atrial enlargement. No significant mitral regurgitation. Measurements: T1 1217 T2 53 LVEDV 134 mL LVSV 51 mL LVEF 38% RVEDV 98 mL RVSV 35 mL RVEF 36% IMPRESSION: 1. Normal LV size with moderate LV hypertrophy. EF 38% with diffuse hypokinesis. 2.  Normal RV size with EF 36%. 3.  T1 elevated, T2 borderline elevated. 4.  Small inferior pericardial effusion. 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 Dalton Mclean Electronically Signed   By: Loralie Champagne M.D.   On: 09/10/2021 17:55     Medical Consultants:   None.   Subjective:    Caleb Simmons no complaints except he is still nauseated.  Has not had breakfast.  Objective:    Vitals:   09/11/21 1310 09/11/21 1400 09/11/21 2006 09/12/21 0625  BP: 138/87 131/77 (!) 144/90 136/81  Pulse: 84 80 80 77  Resp:  _0 Temp: 97.8 F (36.6 C) 97.7 F (36.5 C) 97.6 F (36.4 C) 97.8 F  (36.6 C)  TempSrc: Oral Oral Oral Oral  SpO2: 100% 98% 99% 96%  Weight:    72.7 kg  Height:       SpO2: 96 % O2 Flow Rate (L/min): 2 L/min   Intake/Output Summary (Last 24 hours) at 09/12/2021 0758 Last data filed at 09/11/2021 1838 Gross per 24 hour  Intake 240 ml  Output 575 ml  Net -335 ml   Filed Weights   09/07/21 1615 09/11/21 0953 09/12/21 0625  Weight: 71 kg 75.4 kg 72.7 kg    Exam: General exam: In no acute distress. Respiratory system: Good air movement and clear to auscultation. Cardiovascular system: S1 & S2 heard, RRR. No JVD. Gastrointestinal system: Abdomen is nondistended, soft and nontender.  Extremities: No pedal edema. Skin: No rashes, lesions or ulcers Psychiatry: Judgement and insight appear normal. Mood & affect appropriate.    Data Reviewed:    Labs: Basic Metabolic Panel: Recent Labs  Lab 09/08/21 0530 09/09/21 0336 09/10/21 0229 09/11/21 0251 09/12/21 0217  NA 133* 130* 134* 131* 134*  K 3.3* 4.1 4.1 4.1 4.6  CL 98 95* 98 98 101  CO2 23 20* 21* 22 22  GLUCOSE 105* 226* 118* 139* 96  BUN 32* 40* 41* 39* 39*  CREATININE 5.52* 5.43* 5.04* 4.90* 5.09*  CALCIUM 7.8* 7.6* 7.8* 7.5* 7.9*  MG 1.7  --   --   --   --   PHOS 3.9 2.3* 2.2* 3.3 3.6   GFR Estimated Creatinine Clearance: 13.7 mL/min (A) (by C-G formula based on SCr of 5.09 mg/dL (H)). Liver Function Tests: Recent Labs  Lab 09/08/21 0530 09/09/21 0336 09/10/21 0229 09/11/21 0251 09/12/21 0217  ALBUMIN 1.6* 1.7* 1.6* 1.5* 1.6*   No results for input(s): LIPASE, AMYLASE in the last 168 hours. No results for input(s): AMMONIA in the last 168 hours. Coagulation profile No results for input(s): INR, PROTIME in the last 168 hours. COVID-19 Labs  No results for input(s): DDIMER, FERRITIN, LDH, CRP in the last 72 hours.  Lab Results  Component Value Date   [place] NEGATIVE 08/23/2021    CBC: Recent Labs  Lab 09/08/21 0530 09/09/21 0336 09/10/21 0229  09/11/21 0251 09/12/21 0217  WBC 7.5 12.7* 13.1* 10.6* 10.7*  NEUTROABS  --   --  9.4* 7.0 6.5  HGB 9.3* 9.3* 9.3* 9.0* 9.6*  HCT 29.5* 27.9* 29.1* 28.5* 30.4*  MCV 85.0 81.8 84.3 85.8 84.9  PLT 380 389 409* 419* 473*   Cardiac Enzymes: No results for input(s): CKTOTAL, CKMB, CKMBINDEX, TROPONINI in the last 168 hours. BNP (last 3 results) No results for input(s): PROBNP in the last 8760 hours. CBG: No results for input(s): GLUCAP in the last 168 hours. D-Dimer: No results for input(s): DDIMER in the last 72 hours. Hgb A1c: No results for input(s): HGBA1C in the last 72 hours. Lipid Profile: No results for input(s): CHOL, HDL, LDLCALC, TRIG, CHOLHDL, LDLDIRECT in the last 72 hours. Thyroid function studies: No results for input(s): TSH, T4TOTAL, T3FREE, THYROIDAB in the last 72 hours.  Invalid input(s): FREET3 Anemia work up: No results for input(s): VITAMINB12, FOLATE, FERRITIN, TIBC, IRON, RETICCTPCT in the last 72 hours. Sepsis Labs: Recent Labs  Lab 09/06/21 1234 09/06/21 1512 09/07/21 0125 09/08/21 0530 09/09/21 0336 09/10/21 0229 09/11/21 0251 09/12/21 0217  PROCALCITON 0.76  --  1.26 2.19  --   --   --   --   WBC  --   --  10.9* 7.5 12.7* 13.1* 10.6* 10.7*  LATICACIDVEN 0.9 0.8  --   --   --   --   --   --    Microbiology Recent Results (from the past 240 hour(s))  Urine Culture     Status: Abnormal   Collection Time: 09/06/21 12:29 PM   Specimen: Urine, Clean Catch  Result Value Ref Range Status   Specimen Description URINE, CLEAN CATCH  Final   Special Requests NONE  Final   Culture (A)  Final    >=100,000 COLONIES/mL GROUP B STREP(S.AGALACTIAE)ISOLATED TESTING AGAINST S. AGALACTIAE NOT ROUTINELY PERFORMED DUE TO PREDICTABILITY OF AMP/PEN/VAN SUSCEPTIBILITY. Performed at [facility], [place] [address], [place], [place] [address]    Report Status 09/07/2021 FINAL  Final  Culture, blood (routine x 2)     Status: Abnormal   Collection Time: 09/06/21  12:34 PM   Specimen: BLOOD RIGHT HAND  Result Value Ref Range Status   Specimen Description BLOOD RIGHT HAND  Final   Special Requests   Final    BOTTLES DRAWN AEROBIC AND ANAEROBIC Blood Culture adequate volume   Culture  Setup Time   Final    GRAM POSITIVE COCCI IN CHAINS IN BOTH AEROBIC AND ANAEROBIC BOTTLES CRITICAL RESULT CALLED TO, READ BACK BY AND VERIFIED WITH: V BRYK,[email or web address]  09/07/21 [place] Performed at [facility], [place] [address] [place], [place] [address]    Culture STREPTOCOCCUS AGALACTIAE (A)  Final   Report Status 09/09/2021 FINAL  Final   Organism ID, Bacteria STREPTOCOCCUS AGALACTIAE  Final      Susceptibility   Streptococcus agalactiae - MIC*    CLINDAMYCIN >=1 RESISTANT Resistant     ERYTHROMYCIN >=8 RESISTANT Resistant     VANCOMYCIN 0.5 SENSITIVE Sensitive     CEFTRIAXONE <=0.12 SENSITIVE Sensitive     LEVOFLOXACIN 1 SENSITIVE Sensitive     PENICILLIN Value in next row Sensitive      SENSITIVE<=0.06    * STREPTOCOCCUS AGALACTIAE  Blood Culture ID Panel (Reflexed)     Status: Abnormal   Collection Time: 09/06/21 12:34 PM  Result Value Ref Range Status   Enterococcus faecalis NOT DETECTED NOT DETECTED Final   Enterococcus Faecium NOT DETECTED NOT DETECTED Final   Listeria monocytogenes NOT DETECTED NOT DETECTED Final   Staphylococcus species NOT DETECTED NOT DETECTED Final   Staphylococcus aureus (BCID) NOT DETECTED NOT DETECTED Final   Staphylococcus epidermidis NOT DETECTED NOT DETECTED Final   Staphylococcus lugdunensis NOT DETECTED NOT DETECTED Final   Streptococcus species DETECTED (A) NOT DETECTED Final    Comment: CRITICAL RESULT CALLED TO, READ BACK BY AND VERIFIED WITH: V BRYK,[email or web address]_1  09/07/21 [place]    Streptococcus agalactiae DETECTED (A) NOT DETECTED Final    Comment: CRITICAL RESULT CALLED TO, READ BACK BY AND VERIFIED WITH: V BRYK,[email or web address]_[email or web address]  09/07/21 [employer]    Streptococcus pneumoniae NOT DETECTED NOT DETECTED Final   Streptococcus  pyogenes NOT DETECTED NOT DETECTED Final   A.calcoaceticus-baumannii NOT DETECTED NOT DETECTED Final   Bacteroides fragilis NOT DETECTED NOT DETECTED Final   Enterobacterales NOT DETECTED NOT DETECTED Final   Enterobacter cloacae complex NOT DETECTED NOT DETECTED Final   Escherichia coli NOT DETECTED NOT DETECTED Final   Klebsiella aerogenes NOT DETECTED NOT DETECTED Final   Klebsiella oxytoca NOT DETECTED NOT DETECTED Final   Klebsiella pneumoniae NOT DETECTED NOT DETECTED Final   Proteus species NOT DETECTED NOT DETECTED Final   Salmonella species NOT DETECTED NOT DETECTED Final   Serratia marcescens NOT DETECTED NOT DETECTED Final   Haemophilus influenzae NOT DETECTED NOT DETECTED Final   Neisseria meningitidis NOT DETECTED NOT DETECTED Final   Pseudomonas aeruginosa NOT DETECTED NOT DETECTED Final   Stenotrophomonas maltophilia NOT DETECTED NOT DETECTED Final   Candida albicans NOT DETECTED NOT DETECTED Final   Candida auris NOT DETECTED NOT DETECTED Final   Candida glabrata NOT DETECTED NOT DETECTED Final   Candida krusei NOT DETECTED NOT DETECTED Final   Candida parapsilosis NOT DETECTED NOT DETECTED Final   Candida tropicalis NOT DETECTED NOT DETECTED Final   Cryptococcus neoformans/gattii NOT DETECTED NOT DETECTED Final    Comment: Performed at [facility], [place]. [address], [place], [place] [address]  Culture, blood (routine x 2)     Status: Abnormal   Collection Time: 09/06/21 12:46 PM   Specimen: BLOOD RIGHT HAND  Result Value Ref Range Status   Specimen Description BLOOD RIGHT HAND  Final   Special Requests   Final    BOTTLES DRAWN AEROBIC AND ANAEROBIC Blood Culture adequate volume   Culture  Setup Time   Final    GRAM POSITIVE COCCI IN CHAINS IN  BOTH AEROBIC AND ANAEROBIC BOTTLES CRITICAL VALUE NOTED.  VALUE IS CONSISTENT WITH PREVIOUSLY REPORTED AND CALLED VALUE.    Culture (A)  Final    STREPTOCOCCUS AGALACTIAE SUSCEPTIBILITIES PERFORMED ON PREVIOUS CULTURE  WITHIN THE LAST 5 DAYS. Performed at [facility], [place] [address] [place], [place] [address]    Report Status 09/09/2021 FINAL  Final  Culture, blood (Routine X 2) w Reflex to ID Panel     Status: None (Preliminary result)   Collection Time: 09/08/21  5:30 AM   Specimen: BLOOD LEFT HAND  Result Value Ref Range Status   Specimen Description BLOOD LEFT HAND  Final   Special Requests AEROBIC BOTTLE ONLY Blood Culture adequate volume  Final   Culture   Final    NO GROWTH 3 DAYS Performed at [facility], [place] [address], [place], [place] [address]    Report Status PENDING  Incomplete  Culture, blood (routine x 2)     Status: None (Preliminary result)   Collection Time: 09/08/21  5:31 AM   Specimen: BLOOD RIGHT HAND  Result Value Ref Range Status   Specimen Description BLOOD RIGHT HAND  Final   Special Requests AEROBIC BOTTLE ONLY Blood Culture adequate volume  Final   Culture   Final    NO GROWTH 3 DAYS Performed at [facility], [place] [address], [place], [place] [address]    Report Status PENDING  Incomplete     Medications:    acyclovir  200 mg Oral Q12H   Chlorhexidine Gluconate Cloth  6 each Topical Daily   feeding supplement (NEPRO CARB STEADY)  237 mL Oral TID BM   folic acid  2 mg Oral Daily   Gerhardt's butt cream   Topical BID   melatonin  5 mg Oral QHS   nicotine  14 mg Transdermal Q24H   pantoprazole  40 mg Oral BID AC   sodium bicarbonate  1,300 mg Oral TID   tamsulosin  0.4 mg Oral Daily   Continuous Infusions:  sodium chloride     sodium chloride 10 mL/hr at 08/27/21 2147   pencillin G potassium IV 4 Million Units (09/12/21 0506)   sodium chloride irrigation        LOS: 20 days   Charlynne Cousins  [facility]  09/12/2021, 7:58 AM

## 2021-09-12 NOTE — Progress Notes (Signed)
OT Cancellation Note  Patient Details Name: Caleb Simmons MRN: [id number] DOB: 1951-03-11   Cancelled Treatment:     Pt. Refused times 2 this am. Pt. Refused the first time secondary to nausea. He refused second time because wife just got there. Will try in pm if schedule allows.   Caleb Simmons 09/12/2021, 11:33 AM

## 2021-09-12 NOTE — Progress Notes (Signed)
Caleb Simmons ROUNDING NOTE   Subjective:   Interval History: 70 year old nephrolithiasis generalized weakness presented with acute kidney injury.  Status post renal biopsy 08/28/2021.  Had postbiopsy bleeding into the renal collecting system with clot retention.  Demonstration of three-way catheter performed.  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  She is undergoing dialysis 09/07/2021.  We are dialyzing on a as needed basis.  We are coordinating treatment with his chemotherapy.  Last dialysis was 09/07/2021 with 2.4 L removed.  He is making good urine with 1.5 L 09/11/2021  Blood pressure 136/81 pulse 77 temperature 97.8 O2 sats 97%  Sodium 134 potassium 4.6 chloride 101 CO2 22 BUN 39 creatinine 5 glucose 96 calcium 7.9 phosphorus 3.6 albumin 1.6 hemoglobin 9.6    Objective:  Vital signs in last 24 hours:  Temp:  [97.6 F (36.4 C)-97.8 F (36.6 C)] 97.8 F (36.6 C) (09/21 0625) Pulse Rate:  [77-84] 77 (09/21 0625) Resp:  [16-18] 18 (09/21 0625) BP: (131-144)/(77-90) 136/81 (09/21 0625) SpO2:  [96 %-100 %] 96 % (09/21 0625) Weight:  [72.7 kg-75.4 kg] 72.7 kg (09/21 0625)  Weight change:  Filed Weights   09/07/21 1615 09/11/21 0953 09/12/21 0625  Weight: 71 kg 75.4 kg 72.7 kg    Intake/Output: I/O last 3 completed shifts: In: 240 [P.O.:240] Out: 0240 [Urine:1455]   Intake/Output this shift:  No intake/output data recorded.  General: nad Heart: rrr Lungs: normal wob, cta bl Abdomen: Soft, nontender Extremities: No LE edema. Neurology: Alert, awake, following commands, no asterixis Vascular Access: Right IJ temporary HD catheter in place.   Basic Metabolic Panel: Recent Labs  Lab 09/08/21 0530 09/09/21 0336 09/10/21 0229 09/11/21 0251 09/12/21 0217  NA 133* 130* 134* 131* 134*  K 3.3* 4.1 4.1 4.1 4.6  CL 98 95* 98 98 101  CO2 23 20* 21* 22 22  GLUCOSE 105* 226* 118* 139* 96  BUN 32*  40* 41* 39* 39*  CREATININE 5.52* 5.43* 5.04* 4.90* 5.09*  CALCIUM 7.8* 7.6* 7.8* 7.5* 7.9*  MG 1.7  --   --   --   --   PHOS 3.9 2.3* 2.2* 3.3 3.6     Liver Function Tests: Recent Labs  Lab 09/08/21 0530 09/09/21 0336 09/10/21 0229 09/11/21 0251 09/12/21 0217  ALBUMIN 1.6* 1.7* 1.6* 1.5* 1.6*    No results for input(s): LIPASE, AMYLASE in the last 168 hours. No results for input(s): AMMONIA in the last 168 hours.  CBC: Recent Labs  Lab 09/08/21 0530 09/09/21 0336 09/10/21 0229 09/11/21 0251 09/12/21 0217  WBC 7.5 12.7* 13.1* 10.6* 10.7*  NEUTROABS  --   --  9.4* 7.0 6.5  HGB 9.3* 9.3* 9.3* 9.0* 9.6*  HCT 29.5* 27.9* 29.1* 28.5* 30.4*  MCV 85.0 81.8 84.3 85.8 84.9  PLT 380 389 409* 419* 473*     Cardiac Enzymes: No results for input(s): CKTOTAL, CKMB, CKMBINDEX, TROPONINI in the last 168 hours.  BNP: Invalid input(s): POCBNP  CBG: No results for input(s): GLUCAP in the last 168 hours.  Microbiology: Results for orders placed or performed during the hospital encounter of 08/23/21  SARS CORONAVIRUS 2 (TAT 6-24 HRS) Nasopharyngeal Nasopharyngeal Swab     Status: None   Collection Time: 08/23/21  5:11 PM   Specimen: Nasopharyngeal Swab  Result Value Ref Range Status   SARS Coronavirus 2 NEGATIVE NEGATIVE Final    Comment: (NOTE) SARS-CoV-2 target nucleic acids are NOT DETECTED.  The SARS-CoV-2  RNA is generally detectable in upper and lower respiratory specimens during the acute phase of infection. Negative results do not preclude SARS-CoV-2 infection, do not rule out co-infections with other pathogens, and should not be used as the sole basis for treatment or other patient management decisions. Negative results must be combined with clinical observations, patient history, and epidemiological information. The expected result is Negative.  Fact Sheet for Patients: SugarRoll.be  Fact Sheet for Healthcare  Providers: https://www.woods-mathews.com/  This test is not yet approved or cleared by the [place] FDA and  has been authorized for detection and/or diagnosis of SARS-CoV-2 by FDA under an Emergency Use Authorization (EUA). This EUA will remain  in effect (meaning this test can be used) for the duration of the COVID-19 declaration under Se ction 564(b)(1) of the Act, 21 U.S.C. section 360bbb-3(b)(1), unless the authorization is terminated or revoked sooner.  Performed at [facility], [place] [address] [place], [place] [address]   Urine Culture     Status: Abnormal   Collection Time: 09/06/21 12:29 PM   Specimen: Urine, Clean Catch  Result Value Ref Range Status   Specimen Description URINE, CLEAN CATCH  Final   Special Requests NONE  Final   Culture (A)  Final    >=100,000 COLONIES/mL GROUP B STREP(S.AGALACTIAE)ISOLATED TESTING AGAINST S. AGALACTIAE NOT ROUTINELY PERFORMED DUE TO PREDICTABILITY OF AMP/PEN/VAN SUSCEPTIBILITY. Performed at [facility], [place] [address] [place], [place] [address]    Report Status 09/07/2021 FINAL  Final  Culture, blood (routine x 2)     Status: Abnormal   Collection Time: 09/06/21 12:34 PM   Specimen: BLOOD RIGHT HAND  Result Value Ref Range Status   Specimen Description BLOOD RIGHT HAND  Final   Special Requests   Final    BOTTLES DRAWN AEROBIC AND ANAEROBIC Blood Culture adequate volume   Culture  Setup Time   Final    GRAM POSITIVE COCCI IN CHAINS IN BOTH AEROBIC AND ANAEROBIC BOTTLES CRITICAL RESULT CALLED TO, READ BACK BY AND VERIFIED WITH: V BRYK,PHARMD@[id number]  09/07/21 [place] Performed at [facility], [place] [address] [place],  [address]    Culture STREPTOCOCCUS AGALACTIAE (A)  Final   Report Status 09/09/2021 FINAL  Final   Organism ID, Bacteria STREPTOCOCCUS AGALACTIAE  Final      Susceptibility   Streptococcus agalactiae - MIC*    CLINDAMYCIN >=1 RESISTANT Resistant     ERYTHROMYCIN >=8 RESISTANT  Resistant     VANCOMYCIN 0.5 SENSITIVE Sensitive     CEFTRIAXONE <=0.12 SENSITIVE Sensitive     LEVOFLOXACIN 1 SENSITIVE Sensitive     PENICILLIN Value in next row Sensitive      SENSITIVE<=0.06    * STREPTOCOCCUS AGALACTIAE  Blood Culture ID Panel (Reflexed)     Status: Abnormal   Collection Time: 09/06/21 12:34 PM  Result Value Ref Range Status   Enterococcus faecalis NOT DETECTED NOT DETECTED Final   Enterococcus Faecium NOT DETECTED NOT DETECTED Final   Listeria monocytogenes NOT DETECTED NOT DETECTED Final   Staphylococcus species NOT DETECTED NOT DETECTED Final   Staphylococcus aureus (BCID) NOT DETECTED NOT DETECTED Final   Staphylococcus epidermidis NOT DETECTED NOT DETECTED Final   Staphylococcus lugdunensis NOT DETECTED NOT DETECTED Final   Streptococcus species DETECTED (A) NOT DETECTED Final    Comment: CRITICAL RESULT CALLED TO, READ BACK BY AND VERIFIED WITH: V BRYK,PHARMD@[id number]  09/07/21 [place]    Streptococcus agalactiae DETECTED (A) NOT DETECTED Final    Comment: CRITICAL RESULT  CALLED TO, READ BACK BY AND VERIFIED WITH: V BRYK,PHARMD@[id number]  09/07/21 [place]    Streptococcus pneumoniae NOT DETECTED NOT DETECTED Final   Streptococcus pyogenes NOT DETECTED NOT DETECTED Final   A.calcoaceticus-baumannii NOT DETECTED NOT DETECTED Final   Bacteroides fragilis NOT DETECTED NOT DETECTED Final   Enterobacterales NOT DETECTED NOT DETECTED Final   Enterobacter cloacae complex NOT DETECTED NOT DETECTED Final   Escherichia coli NOT DETECTED NOT DETECTED Final   Klebsiella aerogenes NOT DETECTED NOT DETECTED Final   Klebsiella oxytoca NOT DETECTED NOT DETECTED Final   Klebsiella pneumoniae NOT DETECTED NOT DETECTED Final   Proteus species NOT DETECTED NOT DETECTED Final   Salmonella species NOT DETECTED NOT DETECTED Final   Serratia marcescens NOT DETECTED NOT DETECTED Final   Haemophilus influenzae NOT DETECTED NOT DETECTED Final   Neisseria meningitidis NOT DETECTED NOT DETECTED Final    Pseudomonas aeruginosa NOT DETECTED NOT DETECTED Final   Stenotrophomonas maltophilia NOT DETECTED NOT DETECTED Final   Candida albicans NOT DETECTED NOT DETECTED Final   Candida auris NOT DETECTED NOT DETECTED Final   Candida glabrata NOT DETECTED NOT DETECTED Final   Candida krusei NOT DETECTED NOT DETECTED Final   Candida parapsilosis NOT DETECTED NOT DETECTED Final   Candida tropicalis NOT DETECTED NOT DETECTED Final   Cryptococcus neoformans/gattii NOT DETECTED NOT DETECTED Final    Comment: Performed at [facility], [address] [place], [place] [address]  Culture, blood (routine x 2)     Status: Abnormal   Collection Time: 09/06/21 12:46 PM   Specimen: BLOOD RIGHT HAND  Result Value Ref Range Status   Specimen Description BLOOD RIGHT HAND  Final   Special Requests   Final    BOTTLES DRAWN AEROBIC AND ANAEROBIC Blood Culture adequate volume   Culture  Setup Time   Final    GRAM POSITIVE COCCI IN CHAINS IN BOTH AEROBIC AND ANAEROBIC BOTTLES CRITICAL VALUE NOTED.  VALUE IS CONSISTENT WITH PREVIOUSLY REPORTED AND CALLED VALUE.    Culture (A)  Final    STREPTOCOCCUS AGALACTIAE SUSCEPTIBILITIES PERFORMED ON PREVIOUS CULTURE WITHIN THE LAST 5 DAYS. Performed at [facility], [place] [address] [place], [place] [address]    Report Status 09/09/2021 FINAL  Final  Culture, blood (Routine X 2) w Reflex to ID Panel     Status: None (Preliminary result)   Collection Time: 09/08/21  5:30 AM   Specimen: BLOOD LEFT HAND  Result Value Ref Range Status   Specimen Description BLOOD LEFT HAND  Final   Special Requests AEROBIC BOTTLE ONLY Blood Culture adequate volume  Final   Culture   Final    NO GROWTH 3 DAYS Performed at [facility], [place] [address], [place], [place] [address]    Report Status PENDING  Incomplete  Culture, blood (routine x 2)     Status: None (Preliminary result)   Collection Time: 09/08/21  5:31 AM   Specimen: BLOOD RIGHT HAND  Result Value Ref  Range Status   Specimen Description BLOOD RIGHT HAND  Final   Special Requests AEROBIC BOTTLE ONLY Blood Culture adequate volume  Final   Culture   Final    NO GROWTH 3 DAYS Performed at [facility], [place] [address], [place], [place] [address]    Report Status PENDING  Incomplete    Coagulation Studies: No results for input(s): LABPROT, INR in the last 72 hours.  Urinalysis: No results for input(s): COLORURINE, LABSPEC, [place], [place], [place], BILIRUBINUR, KETONESUR,  PROTEINUR, UROBILINOGEN, NITRITE, LEUKOCYTESUR in the last 72 hours.  Invalid input(s): APPERANCEUR    Imaging: MR CARDIAC MORPHOLOGY WO CONTRAST  Result Date: 09/10/2021 CLINICAL DATA:  Concern for cardiac amyloidosis. EXAM: CARDIAC MRI TECHNIQUE: The patient was scanned on a 1.5 Tesla GE magnet. A dedicated cardiac coil was used. Functional imaging was done using Fiesta sequences. 2,3, and 4 chamber views were done to assess for RWMA's. Modified Simpson's rule using a short axis stack was used to calculate an ejection fraction on a dedicated work Conservation officer, nature. T1 and T2 sequences done. No contrast due to AKI and unstable renal function. CONTRAST:  None FINDINGS: Limited images of the lung fields showed small bilateral pleural effusions. Small inferior pericardial effusion. Normal left ventricular size with moderate LV hypertrophy. Moderate diffuse hypokinesis with EF 38%. Normal right ventricular size with mild-moderate dysfunction, EF 36%. The aortic valve is trileaflet with no significant stenosis or regurgitation. Normal right atrial size. Mild left atrial enlargement. No significant mitral regurgitation. Measurements: T1 1217 T2 53 LVEDV 134 mL LVSV 51 mL LVEF 38% RVEDV 98 mL RVSV 35 mL RVEF 36% IMPRESSION: 1. Normal LV size with moderate LV hypertrophy. EF 38% with diffuse hypokinesis. 2.  Normal RV size with EF 36%. 3.  T1 elevated, T2 borderline elevated. 4.  Small inferior pericardial effusion.  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 Dalton Mclean Electronically Signed   By: Loralie Champagne M.D.   On: 09/10/2021 17:55     Medications:    sodium chloride     sodium chloride 10 mL/hr at 08/27/21 2147   pencillin G potassium IV 4 Million Units (09/12/21 0506)   sodium chloride irrigation      acyclovir  200 mg Oral Q12H   Chlorhexidine Gluconate Cloth  6 each Topical Daily   feeding supplement (NEPRO CARB STEADY)  237 mL Oral TID BM   folic acid  2 mg Oral Daily   Gerhardt's butt cream   Topical BID   melatonin  5 mg Oral QHS   nicotine  14 mg Transdermal Q24H   pantoprazole  40 mg Oral BID AC   sodium bicarbonate  1,300 mg Oral TID   tamsulosin  0.4 mg Oral Daily   acetaminophen, ALPRAZolam, bisacodyl, diphenhydrAMINE, lidocaine, ondansetron **OR** ondansetron (ZOFRAN) IV, oxyCODONE, prochlorperazine, promethazine, technetium albumin aggregated, traMADol, traZODone  Assessment/ Plan:   #Acute kidney injury, nonoliguric, with history of excessive NSAID's use/BC powders every day.  Peaked creatinine level of 6.95. [name]  He underwent IR guided kidney biopsy on 9/6 complicated by postbiopsy bleeding.  Renal biopsy: AL amyloidosis, lambda light chain composition (glomeruli, interstitium, arteries, and arterioles). Marked IF, 59% global glomerular sclerosis and marked arteriosclerosis.   -He had right IJ temporary HD catheter placed on 9/8 in IR during embolization of the bleeding artery.   -Suspected uremia given persistent lack of appetite, dysgeusia, hiccups therefore proceeded with HD on 9/16 (slow start protocol).  He has had no further dialysis treatments although his creatinine does not appear to be improving it is stable.   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  Per  primary service    #Pulm edema appears to have resolved with good diuresis    #Anemia, appears to be relatively stable with a hemoglobin is unchanged   #  History of nephrolithiasis: Followed by urology   #Metabolic acidosis: Appears to have resolved continue oral sodium bicarbonate at this point   LOS: [place] @TODAY @8 :03 AM

## 2021-09-12 NOTE — Progress Notes (Signed)
Initial Nutrition Assessment  DOCUMENTATION CODES:   Non-severe (moderate) malnutrition in context of acute illness/injury  INTERVENTION:   Continue Regular diet. Magic cup TID, each supplement provides 290 kcal and 9 grams of protein.  NUTRITION DIAGNOSIS:   Moderate Malnutrition related to acute illness (amyloidosis) as evidenced by mild fat depletion, mild muscle depletion.  GOAL:   Patient will meet greater than or equal to 90% of their needs  MONITOR:   PO intake, Supplement acceptance  REASON FOR ASSESSMENT:   LOS    ASSESSMENT:   70 yo male admitted with AKI, severe normocytic anemia. Renal biopsy showed amyloidosis. Also found to have strep agalactiae bacteremia. PMH includes nephrolithiasis.  Patient is receiving hemodialysis as needed. Last HD was 9/16, 2.4 L removed. UOP 1600+ ml x 24 hours  Patient started chemotherapy this admission. He c/o fluctuating appetite associated with nausea r/t recent chemotherapy. Patient has tried Nepro and Ensure supplements, but does not like either. He agreed to try magic cups to maximize oral intake.   Patient reports 30 lb weight loss since admission, likely mostly fluid related, but NFPE revealed mild-moderate depletion of muscle and subcutaneous fat mass. Patient meets criteria for moderate malnutrition.  Labs reviewed. Na 134  Medications reviewed and include folic acid, flomax.   No weight history available PTA.   NUTRITION - FOCUSED PHYSICAL EXAM:  Flowsheet Row Most Recent Value  Orbital Region Mild depletion  Upper Arm Region Moderate depletion  Thoracic and Lumbar Region Mild depletion  Buccal Region Mild depletion  Temple Region Moderate depletion  Clavicle Bone Region Moderate depletion  Clavicle and Acromion Bone Region Moderate depletion  Scapular Bone Region Moderate depletion  Dorsal Hand Mild depletion  Patellar Region Mild depletion  Anterior Thigh Region Mild depletion  Posterior Calf Region Mild  depletion  Edema (RD Assessment) Mild  Hair Reviewed  Eyes Reviewed  Mouth Reviewed  Skin Reviewed  Nails Reviewed       Diet Order:   Diet Order             Diet regular Room service appropriate? Yes; Fluid consistency: Thin  Diet effective now           Diet - low sodium heart healthy                   EDUCATION NEEDS:   Education needs have been addressed  Skin:  Skin Assessment: Reviewed RN Assessment  Last BM:  9/21  Height:   Ht Readings from Last 1 Encounters:  08/23/21 5\' 10"  (1.778 m)    Weight:   Wt Readings from Last 1 Encounters:  09/12/21 72.7 kg    Ideal Body Weight:     BMI:  Body mass index is 23 kg/m.  Estimated Nutritional Needs:   Kcal:  2200-2400  Protein:  110-130 gm  Fluid:  2-2.2 L    Lucas Mallow, RD, LDN, CNSC Please refer to Amion for contact information.

## 2021-09-12 NOTE — Progress Notes (Signed)
Mobility Specialist Progress Note   09/12/21 1610  Mobility  Activity Ambulated in room  Level of Assistance Independent after set-up  Assistive Device None  Distance Ambulated (ft) 96 ft  Mobility Ambulated independently in room  Mobility Response Tolerated well  Mobility performed by Mobility specialist  $Mobility charge 1 Mobility   Received pt in bed. Agreeable to mobility session after receiving meds from RN. Pt mod independent for whole session. Practice on sitting and standing at different check points in the room, ambulating in between checkpoints. Pt asx throughout, returned back to bed with slight spell of hiccups and call bell by side.   Pre Mobility: 84 HR, 164/94 BP  Holland Falling Mobility Specialist Phone Number [phone]

## 2021-09-12 NOTE — Progress Notes (Addendum)
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   Subjective:    Started chemotherapy on 09/19. Reports quite a bit of nausea yesterday afternoon and this am. Feels better after having a milkshake.  No shortness of breath. Denies lower extremity edema.   BP stable.    Cardiac MRI 09/10/21 LVEF 38%, normal LV size with moderate LV hypertrophy, small pericardial effusion, elevated T1 readings suggestive of cardiac amyloidosis  Echo 09/08/21 LVEF 45-50%, moderate concentric LVH, grade II DD, RV okay, moderate LAE, small to moderate anterior pericardial effusion, mild MR   Objective:   Weight Range: 72.7 kg Body mass index is 23 kg/m.   Vital Signs:   Temp:  [97.6 F (36.4 C)-97.8 F (36.6 C)] 97.8 F (36.6 C) (09/21 0625) Pulse Rate:  [77-84] 77 (09/21 0625) Resp:  [16-18] 18 (09/21 0625) BP: (131-144)/(77-90) 136/81 (09/21 0625) SpO2:  [96 %-100 %] 96 % (09/21 0625) Weight:  [72.7 kg] 72.7 kg (09/21 0625) Last BM Date: 09/12/21  Weight change: Filed Weights   09/07/21 1615 09/11/21 0953 09/12/21 0625  Weight: 71 kg 75.4 kg 72.7 kg    Intake/Output:   Intake/Output Summary (Last 24 hours) at 09/12/2021 1056 Last data filed at 09/11/2021 1838 Gross per 24 hour  Intake --  Output 225 ml  Net -225 ml      Physical Exam    General:  Elderly male. No acute distress. HEENT: normal Neck: supple. no JVD. [place] RIJ. Carotids 2+ bilat; no bruits.  Cor: PMI nondisplaced. Regular rate & rhythm. No rubs, gallops or murmurs. Lungs: clear Abdomen: soft, nontender, nondistended. No hepatosplenomegaly. No bruits or masses. Good bowel sounds. Extremities: no cyanosis, clubbing, rash, edema Neuro: alert & orientedx3, cranial nerves  grossly intact. moves all 4 extremities w/o difficulty. Affect pleasant    Telemetry   NSR 70s-80s  EKG    No new for review  Labs    CBC Recent Labs    09/11/21 0251 09/12/21 0217  WBC 10.6* 10.7*  NEUTROABS 7.0 6.5  HGB 9.0* 9.6*  HCT 28.5* 30.4*  MCV 85.8 84.9  PLT 419* 683*   Basic Metabolic Panel Recent Labs    09/11/21 0251 09/12/21 0217  NA 131* 134*  K 4.1 4.6  CL 98 101  CO2 22 22  GLUCOSE 139* 96  BUN 39* 39*  CREATININE 4.90* 5.09*  CALCIUM 7.5* 7.9*  PHOS 3.3 3.6   Liver Function Tests Recent Labs    09/11/21 0251 09/12/21 0217  ALBUMIN 1.5* 1.6*   No results for input(s): LIPASE, AMYLASE in the last 72 hours. Cardiac Enzymes No results for input(s): CKTOTAL, CKMB, CKMBINDEX, TROPONINI in the last 72 hours.  BNP: BNP (last 3 results) Recent Labs    08/23/21 1901 09/09/21 1514  BNP 1,300.0* 2,567.7*    ProBNP (last 3 results) No results for input(s): PROBNP in the last 8760 hours.   D-Dimer No results for input(s): DDIMER in the last 72 hours. Hemoglobin A1C No results for input(s): HGBA1C in the last 72 hours. Fasting Lipid Panel No results for input(s): CHOL, HDL, LDLCALC, TRIG, CHOLHDL, LDLDIRECT in the last 72 hours. Thyroid Function Tests No results for input(s): TSH, T4TOTAL, T3FREE, THYROIDAB in the last 72 hours.  Invalid input(s): FREET3  Other results:   Imaging    No results found.   Medications:     Scheduled Medications:  acyclovir  200 mg Oral Q12H   Chlorhexidine Gluconate Cloth  6 each Topical Daily   feeding supplement (NEPRO CARB STEADY)  237 mL Oral TID BM   folic acid  2 mg Oral Daily   Gerhardt's butt cream   Topical BID   melatonin  5 mg Oral QHS   nicotine  14 mg Transdermal Q24H   pantoprazole  40 mg Oral BID AC   sodium bicarbonate  1,300 mg Oral TID   tamsulosin  0.4 mg Oral Daily    Infusions:  sodium chloride     sodium chloride 10 mL/hr at 08/27/21 2147   pencillin G  potassium IV 4 Million Units (09/12/21 0506)   sodium chloride irrigation      PRN Medications: acetaminophen, ALPRAZolam, bisacodyl, diphenhydrAMINE, lidocaine, ondansetron **OR** ondansetron (ZOFRAN) IV, oxyCODONE, prochlorperazine, promethazine, technetium albumin aggregated, traMADol, traZODone      Assessment/Plan   1. 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  Patient presented with weakness/fatigue and was found to have AKI.  -Initiated on chemotherapy this admission with Cytoxan and Velcade per Oncology - Suspect not candidate for autologous hematopoeitic cell transplant with age and renal failure.  2. 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  Echo could be consistent with AL cardiac amyloidosis.  ECG does not show low voltage.  BNP has been elevated and HS-TnI was mildly elevated at admission with no trend, these findings are also common with cardiac amyloidosis though in this patient's case could be due to AKI and volume overload.  AL cardiac amyloidosis generally has a poor prognosis with rapid progression unless treated.   - Unable to give MRI contrast due to unstable renal function (AKI), but T1 elevation and LV morphology suggestive of AL amyloidosis.  EF is low at 38%.  - Use of the usual cardiomyopathy meds in cardiac amyloidosis generally is not as beneficial.  He would not be a candidate for SGLT2 inhibitor/ARNI/spironolactone with AKI, and beta blockers are often poorly tolerated in cardiac amyloidosis. - Diagnosis of AL cardiac amyloidosis at this point will likely make the most difference in terms of prognosis.  Treatment (chemotherapy) will be the same as already planned.  3. Acute systolic CHF: Echo with EF 45-50%. LVEF 38% on  cardiac MRI. In setting of suspected AL cardiac amyloidosis as well as AKI.  Volume status currently looks ok.  Will continue to follow without diuretics.  4.  AKI: History of excessive NSAIDs as well as AL amyloidosis renal involvement.  Creatinine appears to have plateaued, peaked at 6.39, but still elevated at 5.1. He is making urine. He had 1 session of HD on 9/16.  Renal following to determine future need.  Has temporary HD catheter.  5.  Group B strep bacteremia: With associated Group B strep UTI.  Currently afebrile, on PCN.  TTE did not show vegetation.  - ID evaluated patient. Recommended 10 days abx. TEE not felt to be necessary. 6. Anemia - Combination of ABLA and anemia of chronic disease (CKD, iron deficiency, folate deficiency) - Received IV iron and folic acid - Multiple units pRBCs since 09/01 - Hgb stable at 9.6 7. Weight loss - Has  lost over 25 lb. Appetite now starting to improve. Did have some nausea after chemo.   Length of Stay: [place], Orin, PA-C  09/12/2021, 10:56 AM  Advanced Heart Failure Team Pager [phone] (M-F; 7a - 5p)  Please contact [facility] for night-coverage after hours (5p -7a ) and weekends on [email or web address]   Agree with the above PA note.    Creatinine fairly stable today at 5.09.  Patient has started chemotherapy for AL amyloidosis.  Cardiac MRI does suggest cardiac involvement by AL amyloidosis.  No additional recommendations today.  We will sign off but will arrange for CHF clinic followup.   Caleb Simmons 09/12/2021

## 2021-09-13 ENCOUNTER — Encounter (HOSPITAL_COMMUNITY): Payer: Self-pay

## 2021-09-13 DIAGNOSIS — B951 Streptococcus, group B, as the cause of diseases classified elsewhere: Secondary | ICD-10-CM | POA: Diagnosis not present

## 2021-09-13 DIAGNOSIS — R7881 Bacteremia: Secondary | ICD-10-CM | POA: Diagnosis not present

## 2021-09-13 DIAGNOSIS — E8581 Light chain (AL) amyloidosis: Secondary | ICD-10-CM | POA: Diagnosis not present

## 2021-09-13 DIAGNOSIS — E440 Moderate protein-calorie malnutrition: Secondary | ICD-10-CM | POA: Insufficient documentation

## 2021-09-13 LAB — CULTURE, BLOOD (ROUTINE X 2)
Culture: NO GROWTH
Culture: NO GROWTH
Special Requests: ADEQUATE
Special Requests: ADEQUATE

## 2021-09-13 LAB — CBC WITH DIFFERENTIAL/PLATELET
Abs Immature Granulocytes: 0.22 10*3/uL — ABNORMAL HIGH (ref 0.00–0.07)
Basophils Absolute: 0.1 10*3/uL (ref 0.0–0.1)
Basophils Relative: 1 %
Eosinophils Absolute: 0.4 10*3/uL (ref 0.0–0.5)
Eosinophils Relative: 3 %
HCT: 30.1 % — ABNORMAL LOW (ref 39.0–52.0)
Hemoglobin: 9.6 g/dL — ABNORMAL LOW (ref 13.0–17.0)
Immature Granulocytes: 2 %
Lymphocytes Relative: 22 %
Lymphs Abs: 2.9 10*3/uL (ref 0.7–4.0)
MCH: 26.9 pg (ref 26.0–34.0)
MCHC: 31.9 g/dL (ref 30.0–36.0)
MCV: 84.3 fL (ref 80.0–100.0)
Monocytes Absolute: 0.9 10*3/uL (ref 0.1–1.0)
Monocytes Relative: 6 %
Neutro Abs: 8.8 10*3/uL — ABNORMAL HIGH (ref 1.7–7.7)
Neutrophils Relative %: 66 %
Platelets: 483 10*3/uL — ABNORMAL HIGH (ref 150–400)
RBC: 3.57 MIL/uL — ABNORMAL LOW (ref 4.22–5.81)
RDW: 18.2 % — ABNORMAL HIGH (ref 11.5–15.5)
WBC: 13.3 10*3/uL — ABNORMAL HIGH (ref 4.0–10.5)
nRBC: 0 % (ref 0.0–0.2)

## 2021-09-13 LAB — RENAL FUNCTION PANEL
Albumin: 1.6 g/dL — ABNORMAL LOW (ref 3.5–5.0)
Anion gap: 11 (ref 5–15)
BUN: 39 mg/dL — ABNORMAL HIGH (ref 8–23)
CO2: 23 mmol/L (ref 22–32)
Calcium: 8.1 mg/dL — ABNORMAL LOW (ref 8.9–10.3)
Chloride: 101 mmol/L (ref 98–111)
Creatinine, Ser: 5.09 mg/dL — ABNORMAL HIGH (ref 0.61–1.24)
GFR, Estimated: 11 mL/min — ABNORMAL LOW (ref >60)
Glucose, Bld: 96 mg/dL (ref 70–99)
Phosphorus: 4.5 mg/dL (ref 2.5–4.6)
Potassium: 4.8 mmol/L (ref 3.5–5.1)
Sodium: 135 mmol/L (ref 135–145)

## 2021-09-13 NOTE — Progress Notes (Signed)
[facility] PROGRESS NOTE    Progress Note  Caleb Simmons  MEQ:[id number] DOB: [birth year] DOA: 08/23/2021 PCP: Pcp, No     Brief Narrative:   Caleb Simmons is an 70 y.o. male past medical history of nephrolithiasis with no other medical problems, but he does not see a primary care doctor regularly comes into the ED complaining of weakness was found to be in acute kidney injury and severe normocytic anemia.  Seen by nephrology who recommended renal biopsy done on 08/28/2021 unfortunately developed post renal bleeding with clots requiring an insertion of a CBI he is status post 2 units of packed red blood cells but he continued to bleed so IR performed embolization on 08/30/2021, renal biopsy showed amyloidosis.  Oncology has been consulted.  Also found to have strep agalactiae bacteremia.  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 The plan is to continue to administer weekly chemotherapy next 1 due on 09/17/2021. Oncology recommended acyclovir.[name]  AKI (acute kidney injury) ([place]) secondary to amyloid disease: Urine output is picking up yesterday he had about 3 L of urine output.  Creatinine remains at 5. Nephrology is on board last HD treatment on 09/07/2021. Oncology has also been consulted for plasma cell myeloma and started on chemotherapy treatment on 09/10/2021 Cr. Appears to  be stable, urine output is slowly picking up. Continue further management per renal.  Sepsis secondary to strep recollected bacteremia: 2D echo was done that showed no vegetation, but it did show a small drop in his EF. Continue diuresis penicillin and Rocephin ID was consulted who recommended treatment for total 10 days, last day on 09/16/2021.  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 The advanced heart failure team was consulted they recommended a cardiac MRI on 09/10/2021 was suggestive of cardiac amyloid, Cardiology recommended chemotherapy, oncology is on board. No diuretics needed at this time.  Cardiology has signed off.  Gross hematuria/acute urinary retention due to bleeding in the collecting duct of the right kidney post renal biopsy: Foley was discontinued 08/30/2021. He required multiple transfusions. Status post embolization on 08/30/2021. Further management per urology.  Anxiety: Continue Xanax aware that we will not continue Xanax postdischarge, continue trazodone. He is not interested in other medications like SSRI.  Tobacco abuse: Continue nicotine patch.   DVT prophylaxis: lovenxo Family Communication:noen Status is: Inpatient  Remains inpatient appropriate because:Hemodynamically unstable  Dispo: The patient is from: Home              Anticipated d/c is to: Home              Patient currently is not medically stable to d/c.   Difficult to place patient No   Code Status:     Code Status Orders  (From admission, onward)  Start     Ordered   08/23/21 1752  Full code  Continuous        08/23/21 1751           Code Status History     This patient has a current code status but no historical code status.         IV Access:   Peripheral IV   Procedures and diagnostic  studies:   No results found.   Medical Consultants:   None.   Subjective:    Caleb Simmons nausea resolved he tolerated his breakfast this morning.  Objective:    Vitals:   09/12/21 0625 09/12/21 1309 09/12/21 2112 09/13/21 0500  BP: 136/81 132/85 115/69 139/85  Pulse: 77 88 92 76  Resp: 18 18 19 15   Temp: 97.8 F (36.6 C) (!) 97.5 F (36.4 C) 97.8 F (36.6 C) 97.8 F (36.6 C)  TempSrc: Oral Oral Oral Oral  SpO2: 96%  94%   Weight: 72.7 kg   71.8 kg  Height:       SpO2: 94 % O2 Flow Rate (L/min): 2 L/min   Intake/Output Summary (Last 24 hours) at 09/13/2021 0929 Last data filed at 09/13/2021 0811 Gross per 24 hour  Intake 673.98 ml  Output 1575 ml  Net -901.02 ml    Filed Weights   09/11/21 0953 09/12/21 0625 09/13/21 0500  Weight: 75.4 kg 72.7 kg 71.8 kg    Exam: General exam: In no acute distress. Respiratory system: Good air movement and clear to auscultation. Cardiovascular system: S1 & S2 heard, RRR. No JVD. Gastrointestinal system: Abdomen is nondistended, soft and nontender.  Extremities: No pedal edema. Skin: No rashes, lesions or ulcers Psychiatry: Judgement and insight appear normal. Mood & affect appropriate.  Data Reviewed:    Labs: Basic Metabolic Panel: Recent Labs  Lab 09/08/21 0530 09/09/21 0336 09/10/21 0229 09/11/21 0251 09/12/21 0217 09/13/21 0425  NA 133* 130* 134* 131* 134* 135  K 3.3* 4.1 4.1 4.1 4.6 4.8  CL 98 95* 98 98 101 101  CO2 23 20* 21* 22 22 23   GLUCOSE 105* 226* 118* 139* 96 96  BUN 32* 40* 41* 39* 39* 39*  CREATININE 5.52* 5.43* 5.04* 4.90* 5.09* 5.09*  CALCIUM 7.8* 7.6* 7.8* 7.5* 7.9* 8.1*  MG 1.7  --   --   --   --   --   PHOS 3.9 2.3* 2.2* 3.3 3.6 4.5    GFR Estimated Creatinine Clearance: 13.5 mL/min (A) (by C-G formula based on SCr of 5.09 mg/dL (H)). Liver Function Tests: Recent Labs  Lab 09/09/21 0336 09/10/21 0229 09/11/21 0251 09/12/21 0217 09/13/21 0425  ALBUMIN 1.7* 1.6* 1.5* 1.6*  1.6*    No results for input(s): LIPASE, AMYLASE in the last 168 hours. No results for input(s): AMMONIA in the last 168 hours. Coagulation profile No results for input(s): INR, PROTIME in the last 168 hours. COVID-19 Labs  No results for input(s): DDIMER, FERRITIN, LDH, CRP in the last 72 hours.  Lab Results  Component Value Date   [place] NEGATIVE 08/23/2021    CBC: Recent Labs  Lab 09/09/21 0336 09/10/21 0229 09/11/21 0251 09/12/21 0217 09/13/21 0425  WBC 12.7* 13.1* 10.6* 10.7* 13.3*  NEUTROABS  --  9.4* 7.0 6.5 8.8*  HGB 9.3* 9.3* 9.0* 9.6* 9.6*  HCT 27.9* 29.1* 28.5* 30.4* 30.1*  MCV 81.8 84.3 85.8 84.9 84.3  PLT 389 409* 419* 473* 483*    Cardiac Enzymes: No results for input(s):  CKTOTAL, CKMB, CKMBINDEX, TROPONINI in the last 168 hours. BNP (last 3 results) No results for input(s): PROBNP in the last 8760 hours. CBG: No results for input(s): GLUCAP in the last 168 hours. D-Dimer: No results for input(s): DDIMER in the last 72 hours. Hgb A1c: No results for input(s): HGBA1C in the last 72 hours. Lipid Profile: No results for input(s): CHOL, HDL, LDLCALC, TRIG, CHOLHDL, LDLDIRECT in the last 72 hours. Thyroid function studies: No results for input(s): TSH, T4TOTAL, T3FREE, THYROIDAB in the last 72 hours.  Invalid input(s): FREET3 Anemia work up: No results for input(s): VITAMINB12, FOLATE, FERRITIN, TIBC, IRON, RETICCTPCT in the last 72 hours. Sepsis Labs: Recent Labs  Lab 09/06/21 1234 09/06/21 1512 09/07/21 0125 09/07/21 0125 09/08/21 0530 09/09/21 0336 09/10/21 0229 09/11/21 0251 09/12/21 0217 09/13/21 0425  PROCALCITON 0.76  --  1.26  --  2.19  --   --   --   --   --   WBC  --   --  10.9*   < > 7.5   < > 13.1* 10.6* 10.7* 13.3*  LATICACIDVEN 0.9 0.8  --   --   --   --   --   --   --   --    < > = values in this interval not displayed.    Microbiology Recent Results (from the past 240 hour(s))  Urine Culture     Status: Abnormal    Collection Time: 09/06/21 12:29 PM   Specimen: Urine, Clean Catch  Result Value Ref Range Status   Specimen Description URINE, CLEAN CATCH  Final   Special Requests NONE  Final   Culture (A)  Final    >=100,000 COLONIES/mL GROUP B STREP(S.AGALACTIAE)ISOLATED TESTING AGAINST S. AGALACTIAE NOT ROUTINELY PERFORMED DUE TO PREDICTABILITY OF AMP/PEN/VAN SUSCEPTIBILITY. Performed at [facility], [place] [address] [place], [place] [address]    Report Status 09/07/2021 FINAL  Final  Culture, blood (routine x 2)     Status: Abnormal   Collection Time: 09/06/21 12:34 PM   Specimen: BLOOD RIGHT HAND  Result Value Ref Range Status   Specimen Description BLOOD RIGHT HAND  Final   Special Requests   Final    BOTTLES DRAWN AEROBIC AND ANAEROBIC Blood Culture adequate volume   Culture  Setup Time   Final    GRAM POSITIVE COCCI IN CHAINS IN BOTH AEROBIC AND ANAEROBIC BOTTLES CRITICAL RESULT CALLED TO, READ BACK BY AND VERIFIED WITH: V BRYK,PHARMD@[id number]  09/07/21 [place] Performed at [facility], [place] [address] [place], [place] [address]    Culture STREPTOCOCCUS AGALACTIAE (A)  Final   Report Status 09/09/2021 FINAL  Final   Organism ID, Bacteria STREPTOCOCCUS AGALACTIAE  Final      Susceptibility   Streptococcus agalactiae - MIC*    CLINDAMYCIN >=1 RESISTANT Resistant     ERYTHROMYCIN >=8 RESISTANT Resistant     VANCOMYCIN 0.5 SENSITIVE Sensitive     CEFTRIAXONE <=0.12 SENSITIVE Sensitive     LEVOFLOXACIN 1 SENSITIVE Sensitive     PENICILLIN Value in next row Sensitive      SENSITIVE<=0.06    * STREPTOCOCCUS AGALACTIAE  Blood Culture ID Panel (Reflexed)     Status: Abnormal   Collection Time: 09/06/21 12:34 PM  Result Value Ref Range Status   Enterococcus faecalis NOT DETECTED NOT DETECTED Final   Enterococcus Faecium NOT DETECTED NOT DETECTED Final   Listeria monocytogenes NOT DETECTED NOT DETECTED Final   Staphylococcus species NOT DETECTED NOT DETECTED Final  Staphylococcus aureus  (BCID) NOT DETECTED NOT DETECTED Final   Staphylococcus epidermidis NOT DETECTED NOT DETECTED Final   Staphylococcus lugdunensis NOT DETECTED NOT DETECTED Final   Streptococcus species DETECTED (A) NOT DETECTED Final    Comment: CRITICAL RESULT CALLED TO, READ BACK BY AND VERIFIED WITH: V BRYK,PHARMD@[id number]  09/07/21 [place]    Streptococcus agalactiae DETECTED (A) NOT DETECTED Final    Comment: CRITICAL RESULT CALLED TO, READ BACK BY AND VERIFIED WITH: V BRYK,PHARMD@[id number]  09/07/21 [place]    Streptococcus pneumoniae NOT DETECTED NOT DETECTED Final   Streptococcus pyogenes NOT DETECTED NOT DETECTED Final   A.calcoaceticus-baumannii NOT DETECTED NOT DETECTED Final   Bacteroides fragilis NOT DETECTED NOT DETECTED Final   Enterobacterales NOT DETECTED NOT DETECTED Final   Enterobacter cloacae complex NOT DETECTED NOT DETECTED Final   Escherichia coli NOT DETECTED NOT DETECTED Final   Klebsiella aerogenes NOT DETECTED NOT DETECTED Final   Klebsiella oxytoca NOT DETECTED NOT DETECTED Final   Klebsiella pneumoniae NOT DETECTED NOT DETECTED Final   Proteus species NOT DETECTED NOT DETECTED Final   Salmonella species NOT DETECTED NOT DETECTED Final   Serratia marcescens NOT DETECTED NOT DETECTED Final   Haemophilus influenzae NOT DETECTED NOT DETECTED Final   Neisseria meningitidis NOT DETECTED NOT DETECTED Final   Pseudomonas aeruginosa NOT DETECTED NOT DETECTED Final   Stenotrophomonas maltophilia NOT DETECTED NOT DETECTED Final   Candida albicans NOT DETECTED NOT DETECTED Final   Candida auris NOT DETECTED NOT DETECTED Final   Candida glabrata NOT DETECTED NOT DETECTED Final   Candida krusei NOT DETECTED NOT DETECTED Final   Candida parapsilosis NOT DETECTED NOT DETECTED Final   Candida tropicalis NOT DETECTED NOT DETECTED Final   Cryptococcus neoformans/gattii NOT DETECTED NOT DETECTED Final    Comment: Performed at [facility], [address] [place], [place] [address]  Culture, blood  (routine x 2)     Status: Abnormal   Collection Time: 09/06/21 12:46 PM   Specimen: BLOOD RIGHT HAND  Result Value Ref Range Status   Specimen Description BLOOD RIGHT HAND  Final   Special Requests   Final    BOTTLES DRAWN AEROBIC AND ANAEROBIC Blood Culture adequate volume   Culture  Setup Time   Final    GRAM POSITIVE COCCI IN CHAINS IN BOTH AEROBIC AND ANAEROBIC BOTTLES CRITICAL VALUE NOTED.  VALUE IS CONSISTENT WITH PREVIOUSLY REPORTED AND CALLED VALUE.    Culture (A)  Final    STREPTOCOCCUS AGALACTIAE SUSCEPTIBILITIES PERFORMED ON PREVIOUS CULTURE WITHIN THE LAST 5 DAYS. Performed at [facility], [place] [address], [place], [place] [address]    Report Status 09/09/2021 FINAL  Final  Culture, blood (Routine X 2) w Reflex to ID Panel     Status: None (Preliminary result)   Collection Time: 09/08/21  5:30 AM   Specimen: BLOOD LEFT HAND  Result Value Ref Range Status   Specimen Description BLOOD LEFT HAND  Final   Special Requests AEROBIC BOTTLE ONLY Blood Culture adequate volume  Final   Culture   Final    NO GROWTH 4 DAYS Performed at [facility], [place] [address] [place],  [address]    Report Status PENDING  Incomplete  Culture, blood (routine x 2)     Status: None (Preliminary result)   Collection Time: 09/08/21  5:31 AM   Specimen: BLOOD RIGHT HAND  Result Value Ref Range Status   Specimen Description BLOOD RIGHT HAND  Final   Special Requests AEROBIC BOTTLE ONLY  Blood Culture adequate volume  Final   Culture   Final    NO GROWTH 4 DAYS Performed at [facility], [place] [address], [place], [place] [address]    Report Status PENDING  Incomplete     Medications:    acyclovir  200 mg Oral Q12H   Chlorhexidine Gluconate Cloth  6 each Topical Daily   feeding supplement (NEPRO CARB STEADY)  237 mL Oral TID BM   folic acid  2 mg Oral Daily   Gerhardt's butt cream   Topical BID   melatonin  5 mg Oral QHS   nicotine  14 mg Transdermal Q24H    pantoprazole  40 mg Oral BID AC   sodium bicarbonate  1,300 mg Oral TID   tamsulosin  0.4 mg Oral Daily   Continuous Infusions:  sodium chloride     sodium chloride 10 mL/hr at 09/13/21 0811   pencillin G potassium IV 4 Million Units (09/13/21 0415)   sodium chloride irrigation        LOS: 21 days   Charlynne Cousins  [facility]  09/13/2021, 9:29 AM

## 2021-09-13 NOTE — Plan of Care (Signed)

## 2021-09-13 NOTE — Progress Notes (Signed)
OT Discharge note  Pt. Has met all goals and education is complete. Pt. Is an agreement with OT dc.    09/13/21 1000  OT Visit Information  Last OT Received On 09/13/21  Assistance Needed +1  History of Present Illness Pt is a 70 y.o. male admitted 08/23/21 with generalized weakness, SOB. FOund to have AKI, severe normocytic anemia, Grp B strep bacteremia/Grp B strep UTI. S/p R renal biopsy 9/6; pt developed bleeding with clot retention requiring insertion of three-way catheter and CBI; bleeding continued s/p embolization 9/8. Pt with R iliac bone marrow biopsy 9/14. Pt with AL amyloidosis/plasma cell myeloma; chemotherapy initiated 9/19. Concern for cardiac involvement with cardiac MRI 9/19. PMH includes nephrolithiasis, tobacco use.  Precautions  Precautions Fall  Precaution Comments watch bp  Pain Assessment  Pain Assessment No/denies pain  Cognition  Arousal/Alertness Awake/alert  Behavior During Therapy WFL for tasks assessed/performed  Upper Extremity Assessment  Upper Extremity Assessment Overall WFL for tasks assessed;Generalized weakness  Lower Extremity Assessment  Lower Extremity Assessment Defer to PT evaluation  ADL  Overall ADL's  Needs assistance/impaired  Eating/Feeding Independent;Sitting  Grooming Wash/dry hands;Oral care;Wash/dry face;Standing;Independent  Upper Body Bathing Independent  Lower Body Bathing Independent  Upper Body Dressing  Independent  Lower Body Dressing Independent  Toilet Transfer Independent  Toileting- Clothing Manipulation and Hygiene Independent  Functional mobility during ADLs Independent  General ADL Comments Pt. able to preform adls without assist and good balance.  Bed Mobility  Overal bed mobility Independent  Balance  Sitting balance-Leahy Scale Normal  Standing balance-Leahy Scale Good  Restrictions  Weight Bearing Restrictions No  Vision- Assessment  Vision Assessment? No apparent visual deficits  Transfers  Overall transfer  level Independent  Equipment used None  Transfers Sit to/from Stand  Sit to Stand Independent  OT - End of Session  Equipment Utilized During Treatment Gait belt  Activity Tolerance Patient tolerated treatment well  Patient left in bed;with call bell/phone within reach;with family/visitor present  Nurse Communication  (ok therapy)  OT Assessment/Plan  OT Plan All goals met and education completed, patient discharged from OT services  Follow Up Recommendations No OT follow up  OT Equipment None recommended by OT  AM-PAC OT "6 Clicks" Daily Activity Outcome Measure (Version 2)  Help from another person eating meals? 4  Help from another person taking care of personal grooming? 4  Help from another person toileting, which includes using toliet, bedpan, or urinal? 4  Help from another person bathing (including washing, rinsing, drying)? 4  Help from another person to put on and taking off regular upper body clothing? 4  Help from another person to put on and taking off regular lower body clothing? 4  6 Click Score 24  Progressive Mobility  What is the highest level of mobility based on the progressive mobility assessment? Level 6 (Walks independently in room and hall) - Balance while walking in room without assist - Complete  Mobility Ambulated independently in room  OT Goal Progression  Progress towards OT goals Goals met/education completed, patient discharged from OT  Acute Rehab OT Goals  Patient Stated Goal go home  ADL Goals  Pt Will Perform Grooming Independently  Pt Will Perform Lower Body Bathing Independently  Pt Will Perform Lower Body Dressing Independently  Pt Will Transfer to Toilet Independently  Pt Will Perform Licensed conveyancer transfer;Independently  Additional ADL Goal #1 Pt will engage in 15 min standing functional activities without loss of balance and without dizziness/lightheadedness, in order  to demonstrate improved activity tolerance and balance needed  to perform ADLs safely at home.  OT Time Calculation  OT Start Time (ACUTE ONLY) 1030  OT Stop Time (ACUTE ONLY) 1103  OT Time Calculation (min) 33 min  OT General Charges  $OT Visit 1 Visit  OT Treatments  $Self Care/Home Management  23-37 mins  Reece Packer OT/L

## 2021-09-13 NOTE — Progress Notes (Signed)
[facility] ROUNDING NOTE   Subjective:   Interval History: 70 year old nephrolithiasis generalized weakness presented with acute kidney injury.  Status post renal biopsy 08/28/2021.  Had postbiopsy bleeding into the renal collecting system with clot retention.  Demonstration of three-way catheter performed.  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  She is undergoing dialysis 09/07/2021.  We are dialyzing on a as needed basis.  We are coordinating treatment with his chemotherapy.  Last dialysis was 09/07/2021 with 2.4 L removed.  He is making good urine with 775 cc  09/12/2021  Blood pressure 139/85 pulse 80 temperature 97.8 O2 sats 100%  Sodium 135 potassium 4.8 chloride 101 CO2 23 BUN 39 creatinine 5 glucose 96 calcium 8.1 albumin 1.6 hemoglobin 9.6    Objective:  Vital signs in last 24 hours:  Temp:  [97.5 F (36.4 C)-97.8 F (36.6 C)] 97.8 F (36.6 C) (09/22 0500) Pulse Rate:  [76-92] 76 (09/22 0500) Resp:  [15-19] 15 (09/22 0500) BP: (115-139)/(69-85) 139/85 (09/22 0500) SpO2:  [94 %] 94 % (09/21 2112) Weight:  [71.8 kg] 71.8 kg (09/22 0500)  Weight change: -3.633 kg Filed Weights   09/11/21 0953 09/12/21 0625 09/13/21 0500  Weight: 75.4 kg 72.7 kg 71.8 kg    Intake/Output: I/O last 3 completed shifts: In: 647.9 [I.V.:397.9; IV Piggyback:250] Out: 6659 [Urine:1575]   Intake/Output this shift:  Total I/O In: 26.1 [I.V.:26.1] Out: -   General: nad Heart: rrr Lungs: normal wob, cta bl Abdomen: Soft, nontender Extremities: No LE edema. Neurology: Alert, awake, following commands, no asterixis Vascular Access: Right IJ temporary HD catheter in place.   Basic Metabolic Panel: Recent Labs  Lab 09/08/21 0530 09/09/21 0336 09/10/21 0229 09/11/21 0251 09/12/21 0217 09/13/21 0425  NA 133* 130* 134* 131* 134* 135  K 3.3* 4.1 4.1 4.1 4.6 4.8  CL 98 95* 98 98 101 101  CO2 23 20* 21* 22 22  23   GLUCOSE 105* 226* 118* 139* 96 96  BUN 32* 40* 41* 39* 39* 39*  CREATININE 5.52* 5.43* 5.04* 4.90* 5.09* 5.09*  CALCIUM 7.8* 7.6* 7.8* 7.5* 7.9* 8.1*  MG 1.7  --   --   --   --   --   PHOS 3.9 2.3* 2.2* 3.3 3.6 4.5     Liver Function Tests: Recent Labs  Lab 09/09/21 0336 09/10/21 0229 09/11/21 0251 09/12/21 0217 09/13/21 0425  ALBUMIN 1.7* 1.6* 1.5* 1.6* 1.6*    No results for input(s): LIPASE, AMYLASE in the last 168 hours. No results for input(s): AMMONIA in the last 168 hours.  CBC: Recent Labs  Lab 09/09/21 0336 09/10/21 0229 09/11/21 0251 09/12/21 0217 09/13/21 0425  WBC 12.7* 13.1* 10.6* 10.7* 13.3*  NEUTROABS  --  9.4* 7.0 6.5 8.8*  HGB 9.3* 9.3* 9.0* 9.6* 9.6*  HCT 27.9* 29.1* 28.5* 30.4* 30.1*  MCV 81.8 84.3 85.8 84.9 84.3  PLT 389 409* 419* 473* 483*     Cardiac Enzymes: No results for input(s): CKTOTAL, CKMB, CKMBINDEX, TROPONINI in the last 168 hours.  BNP: Invalid input(s): POCBNP  CBG: No results for input(s): GLUCAP in the last 168 hours.  Microbiology: Results for orders placed or performed during the hospital encounter of 08/23/21  SARS CORONAVIRUS 2 (TAT 6-24 HRS) Nasopharyngeal Nasopharyngeal Swab     Status: None   Collection Time: 08/23/21  5:11 PM   Specimen: Nasopharyngeal Swab  Result Value Ref Range Status   SARS Coronavirus 2 NEGATIVE NEGATIVE  Final    Comment: (NOTE) SARS-CoV-2 target nucleic acids are NOT DETECTED.  The SARS-CoV-2 RNA is generally detectable in upper and lower respiratory specimens during the acute phase of infection. Negative results do not preclude SARS-CoV-2 infection, do not rule out co-infections with other pathogens, and should not be used as the sole basis for treatment or other patient management decisions. Negative results must be combined with clinical observations, patient history, and epidemiological information. The expected result is Negative.  Fact Sheet for  Patients: SugarRoll.be  Fact Sheet for Healthcare Providers: https://www.woods-mathews.com/  This test is not yet approved or cleared by the [place] FDA and  has been authorized for detection and/or diagnosis of SARS-CoV-2 by FDA under an Emergency Use Authorization (EUA). This EUA will remain  in effect (meaning this test can be used) for the duration of the COVID-19 declaration under Se ction 564(b)(1) of the Act, 21 U.S.C. section 360bbb-3(b)(1), unless the authorization is terminated or revoked sooner.  Performed at [facility], [place] [address] [place], [place] [address]   Urine Culture     Status: Abnormal   Collection Time: 09/06/21 12:29 PM   Specimen: Urine, Clean Catch  Result Value Ref Range Status   Specimen Description URINE, CLEAN CATCH  Final   Special Requests NONE  Final   Culture (A)  Final    >=100,000 COLONIES/mL GROUP B STREP(S.AGALACTIAE)ISOLATED TESTING AGAINST S. AGALACTIAE NOT ROUTINELY PERFORMED DUE TO PREDICTABILITY OF AMP/PEN/VAN SUSCEPTIBILITY. Performed at [facility], [place] [address] [place], [place] [address]    Report Status 09/07/2021 FINAL  Final  Culture, blood (routine x 2)     Status: Abnormal   Collection Time: 09/06/21 12:34 PM   Specimen: BLOOD RIGHT HAND  Result Value Ref Range Status   Specimen Description BLOOD RIGHT HAND  Final   Special Requests   Final    BOTTLES DRAWN AEROBIC AND ANAEROBIC Blood Culture adequate volume   Culture  Setup Time   Final    GRAM POSITIVE COCCI IN CHAINS IN BOTH AEROBIC AND ANAEROBIC BOTTLES CRITICAL RESULT CALLED TO, READ BACK BY AND VERIFIED WITH: V BRYK,PHARMD@[id number]  09/07/21 [place] Performed at [facility], [place] [address] [place], [place] [address]    Culture STREPTOCOCCUS AGALACTIAE (A)  Final   Report Status 09/09/2021 FINAL  Final   Organism ID, Bacteria STREPTOCOCCUS AGALACTIAE  Final      Susceptibility   Streptococcus  agalactiae - MIC*    CLINDAMYCIN >=1 RESISTANT Resistant     ERYTHROMYCIN >=8 RESISTANT Resistant     VANCOMYCIN 0.5 SENSITIVE Sensitive     CEFTRIAXONE <=0.12 SENSITIVE Sensitive     LEVOFLOXACIN 1 SENSITIVE Sensitive     PENICILLIN Value in next row Sensitive      SENSITIVE<=0.06    * STREPTOCOCCUS AGALACTIAE  Blood Culture ID Panel (Reflexed)     Status: Abnormal   Collection Time: 09/06/21 12:34 PM  Result Value Ref Range Status   Enterococcus faecalis NOT DETECTED NOT DETECTED Final   Enterococcus Faecium NOT DETECTED NOT DETECTED Final   Listeria monocytogenes NOT DETECTED NOT DETECTED Final   Staphylococcus species NOT DETECTED NOT DETECTED Final   Staphylococcus aureus (BCID) NOT DETECTED NOT DETECTED Final   Staphylococcus epidermidis NOT DETECTED NOT DETECTED Final   Staphylococcus lugdunensis NOT DETECTED NOT DETECTED Final   Streptococcus species DETECTED (A) NOT DETECTED Final    Comment: CRITICAL RESULT CALLED TO, READ BACK BY AND VERIFIED WITH: V BRYK,PHARMD@[id number]  09/07/21 [place]  Streptococcus agalactiae DETECTED (A) NOT DETECTED Final    Comment: CRITICAL RESULT CALLED TO, READ BACK BY AND VERIFIED WITH: V BRYK,PHARMD@[id number]  09/07/21 [place]    Streptococcus pneumoniae NOT DETECTED NOT DETECTED Final   Streptococcus pyogenes NOT DETECTED NOT DETECTED Final   A.calcoaceticus-baumannii NOT DETECTED NOT DETECTED Final   Bacteroides fragilis NOT DETECTED NOT DETECTED Final   Enterobacterales NOT DETECTED NOT DETECTED Final   Enterobacter cloacae complex NOT DETECTED NOT DETECTED Final   Escherichia coli NOT DETECTED NOT DETECTED Final   Klebsiella aerogenes NOT DETECTED NOT DETECTED Final   Klebsiella oxytoca NOT DETECTED NOT DETECTED Final   Klebsiella pneumoniae NOT DETECTED NOT DETECTED Final   Proteus species NOT DETECTED NOT DETECTED Final   Salmonella species NOT DETECTED NOT DETECTED Final   Serratia marcescens NOT DETECTED NOT DETECTED Final   Haemophilus influenzae  NOT DETECTED NOT DETECTED Final   Neisseria meningitidis NOT DETECTED NOT DETECTED Final   Pseudomonas aeruginosa NOT DETECTED NOT DETECTED Final   Stenotrophomonas maltophilia NOT DETECTED NOT DETECTED Final   Candida albicans NOT DETECTED NOT DETECTED Final   Candida auris NOT DETECTED NOT DETECTED Final   Candida glabrata NOT DETECTED NOT DETECTED Final   Candida krusei NOT DETECTED NOT DETECTED Final   Candida parapsilosis NOT DETECTED NOT DETECTED Final   Candida tropicalis NOT DETECTED NOT DETECTED Final   Cryptococcus neoformans/gattii NOT DETECTED NOT DETECTED Final    Comment: Performed at [facility], [place]. [address], [place], [place] [address]  Culture, blood (routine x 2)     Status: Abnormal   Collection Time: 09/06/21 12:46 PM   Specimen: BLOOD RIGHT HAND  Result Value Ref Range Status   Specimen Description BLOOD RIGHT HAND  Final   Special Requests   Final    BOTTLES DRAWN AEROBIC AND ANAEROBIC Blood Culture adequate volume   Culture  Setup Time   Final    GRAM POSITIVE COCCI IN CHAINS IN BOTH AEROBIC AND ANAEROBIC BOTTLES CRITICAL VALUE NOTED.  VALUE IS CONSISTENT WITH PREVIOUSLY REPORTED AND CALLED VALUE.    Culture (A)  Final    STREPTOCOCCUS AGALACTIAE SUSCEPTIBILITIES PERFORMED ON PREVIOUS CULTURE WITHIN THE LAST 5 DAYS. Performed at [facility], [place] [address], [place], [place] [address]    Report Status 09/09/2021 FINAL  Final  Culture, blood (Routine X 2) w Reflex to ID Panel     Status: None (Preliminary result)   Collection Time: 09/08/21  5:30 AM   Specimen: BLOOD LEFT HAND  Result Value Ref Range Status   Specimen Description BLOOD LEFT HAND  Final   Special Requests AEROBIC BOTTLE ONLY Blood Culture adequate volume  Final   Culture   Final    NO GROWTH 4 DAYS Performed at [facility], [place] [address] [place], [place] [address]    Report Status PENDING  Incomplete  Culture, blood (routine x 2)     Status: None (Preliminary  result)   Collection Time: 09/08/21  5:31 AM   Specimen: BLOOD RIGHT HAND  Result Value Ref Range Status   Specimen Description BLOOD RIGHT HAND  Final   Special Requests AEROBIC BOTTLE ONLY Blood Culture adequate volume  Final   Culture   Final    NO GROWTH 4 DAYS Performed at [facility], [place] [address], [place],  [address]    Report Status PENDING  Incomplete    Coagulation Studies: No results for input(s): LABPROT, INR in the last 72 hours.  Urinalysis: No results for input(s): COLORURINE, LABSPEC, PHURINE, GLUCOSEU, HGBUR, BILIRUBINUR, KETONESUR, PROTEINUR, UROBILINOGEN, NITRITE, LEUKOCYTESUR in the last 72 hours.  Invalid input(s): APPERANCEUR    Imaging: No results found.   Medications:    sodium chloride     sodium chloride 10 mL/hr at 09/13/21 2800   pencillin G potassium IV 4 Million Units (09/13/21 0415)   sodium chloride irrigation      acyclovir  200 mg Oral Q12H   Chlorhexidine Gluconate Cloth  6 each Topical Daily   feeding supplement (NEPRO CARB STEADY)  237 mL Oral TID BM   folic acid  2 mg Oral Daily   Gerhardt's butt cream   Topical BID   melatonin  5 mg Oral QHS   nicotine  14 mg Transdermal Q24H   pantoprazole  40 mg Oral BID AC   sodium bicarbonate  1,300 mg Oral TID   tamsulosin  0.4 mg Oral Daily   acetaminophen, ALPRAZolam, bisacodyl, diphenhydrAMINE, lidocaine, ondansetron **OR** ondansetron (ZOFRAN) IV, oxyCODONE, prochlorperazine, promethazine, technetium albumin aggregated, traMADol, traZODone  Assessment/ Plan:   #Acute kidney injury, nonoliguric, with history of excessive NSAID's use/BC powders every day.  Peaked creatinine level of 6.95. [name]  He underwent IR guided kidney biopsy on 9/6 complicated by postbiopsy bleeding.  Renal biopsy: AL amyloidosis, lambda light chain composition (glomeruli, interstitium, arteries, and arterioles). Marked IF, 59% global glomerular sclerosis and marked arteriosclerosis.   -He had right IJ  temporary HD catheter placed on 9/8 in IR during embolization of the bleeding artery.   -Suspected uremia given persistent lack of appetite, dysgeusia, hiccups therefore proceeded with HD on 9/16 (slow start protocol).  He has had no further dialysis treatments although his creatinine does not appear to be improving it is stable.  Continue to avoid nephrotoxins.  Hopefully have some recovery.   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  Per primary service    #Pulm edema appears to have resolved with good diuresis    #Anemia, appears to be relatively stable with a hemoglobin is unchanged   #History of nephrolithiasis: Followed by urology   #Metabolic acidosis: Appears to have resolved continue oral sodium bicarbonate at this point   LOS: [place] @TODAY @10 :00 AM

## 2021-09-13 NOTE — Progress Notes (Signed)
HF CSW spoke with the patient and his family at bedside. The patient's daughter-in-law, Lanora Manis gave the CSW FMLA paperwork for [facility] employer to fill out and get back to her and the employer. CSW filled out the [facility] paperwork and had the attending MD sign the paperwork and then sent it to Mr. Jindra employer and gave the paperwork back to [place].   HF team is no longer following and have signed off at this time.   HF CSW will sign off for now as HF social work intervention is no longer needed. Please consult [place] again if new needs arise.  Yosgart Pavey, MSW, [place] [facility] Failure Social Worker

## 2021-09-13 NOTE — Progress Notes (Addendum)
HEMATOLOGY-ONCOLOGY PROGRESS NOTE  SUBJECTIVE: Caleb Simmons reports mild intermittent nausea.  Using antiemetics which are effective.  No vomiting reported.  Continues to have hiccups intermittently.  He is trying to ambulate in his room in the hallway as much as possible.  REVIEW OF SYSTEMS:   Constitutional: Denies fevers, chills  Eyes: Denies blurriness of vision Ears, nose, mouth, throat, and face: Denies mucositis or sore throat Respiratory: Denies cough, dyspnea or wheezes Cardiovascular: Denies palpitation, chest discomfort Gastrointestinal: Reports nausea, denies vomiting Skin: Denies abnormal skin rashes Lymphatics: Denies new lymphadenopathy or easy bruising Neurological:Denies numbness, tingling or new weaknesses Behavioral/Psych: Mood is stable, no new changes  Extremities: No lower extremity edema All other systems were reviewed with the patient and are negative.  I have reviewed the past medical history, past surgical history, social history and family history with the patient and they are unchanged from previous note.   PHYSICAL EXAMINATION: ECOG PERFORMANCE STATUS: 2 - Symptomatic, <50% confined to bed  Vitals:   09/12/21 2112 09/13/21 0500  BP: 115/69 139/85  Pulse: 92 76  Resp: 19 15  Temp: 97.8 F (36.6 C) 97.8 F (36.6 C)  SpO2: 94%    Filed Weights   09/11/21 0953 09/12/21 0625 09/13/21 0500  Weight: 75.4 kg 72.7 kg 71.8 kg    Intake/Output from previous day: 09/21 0701 - 09/22 0700 In: 647.9 [I.V.:397.9; IV Piggyback:250] Out: 6387 [Urine:1575]  GENERAL:alert, no distress and comfortable SKIN: skin color, texture, turgor are normal, no rashes or significant lesions EYES: normal, Conjunctiva are pink and non-injected, sclera clear OROPHARYNX:no exudate, no erythema and lips, buccal mucosa, and tongue normal  LUNGS: clear to auscultation and percussion with normal breathing effort HEART: regular rate & rhythm and no murmurs and no lower extremity  edema ABDOMEN:abdomen soft, non-tender and normal bowel sounds NEURO: alert & oriented x 3 with fluent speech, no focal motor/sensory deficits  LABORATORY DATA:  I have reviewed the data as listed CMP Latest Ref Rng & Units 09/13/2021 09/12/2021 09/11/2021  Glucose 70 - 99 mg/dL 96 96 139(H)  BUN 8 - 23 mg/dL 39(H) 39(H) 39(H)  Creatinine 0.61 - 1.24 mg/dL 5.09(H) 5.09(H) 4.90(H)  Sodium 135 - 145 mmol/L 135 134(L) 131(L)  Potassium 3.5 - 5.1 mmol/L 4.8 4.6 4.1  Chloride 98 - 111 mmol/L 101 101 98  CO2 22 - 32 mmol/L 23 22 22   Calcium 8.9 - 10.3 mg/dL 8.1(L) 7.9(L) 7.5(L)  Total Protein 6.5 - 8.1 g/dL - - -  Total Bilirubin 0.3 - 1.2 mg/dL - - -  Alkaline Phos 38 - 126 U/L - - -  AST 15 - 41 U/L - - -  ALT 0 - 44 U/L - - -    Lab Results  Component Value Date   WBC 13.3 (H) 09/13/2021   HGB 9.6 (L) 09/13/2021   HCT 30.1 (L) 09/13/2021   MCV 84.3 09/13/2021   PLT 483 (H) 09/13/2021   NEUTROABS 8.8 (H) 09/13/2021    CT ABDOMEN PELVIS WO CONTRAST  Result Date: 08/30/2021 CLINICAL DATA:  Retroperitoneal hematoma, follow up s/p random renal bx with perinephric hematoma development EXAM: CT ABDOMEN AND PELVIS WITHOUT CONTRAST TECHNIQUE: Multidetector CT imaging of the abdomen and pelvis was performed following the standard protocol without IV contrast. COMPARISON:  September 6 FINDINGS: Inferior chest: Trace bilateral pleural effusions with right greater than left compressive subsegmental atelectasis. Hepatobiliary: The liver is normal in size without focal abnormality. No intrahepatic or extrahepatic biliary ductal dilation. The gallbladder appears  normal. Spleen: Normal in size without focal abnormality. Pancreas: No pancreatic ductal dilatation or surrounding inflammatory changes. Adrenals/Urinary Tract: Adrenal glands are unremarkable. The kidneys are normal in size. Tiny perinephric hematoma along the right renal lower pole is unchanged. Hyperdense material in the right collecting system  and bladder consistent with blood products, grossly similar. Stomach/Bowel: The stomach, small bowel and large bowel are normal in caliber without abnormal wall thickening or surrounding inflammatory changes. Reproductive: Prostate is unremarkable. Lymphatic: No enlarged lymph nodes in the abdomen or pelvis. Vasculature: The abdominal aorta is normal in caliber. Aortic atherosclerosis. Other: No abdominopelvic ascites. Musculoskeletal: No aggressive osseous lesions. Degenerative changes at L5-S1. Bone island in the left ilium. IMPRESSION: The small perinephric hematoma along the right renal lower pole is stable, and within expected limits after percutaneous biopsy. However, there remains substantial clot burden within the bladder. Follow-up urology recommendations for management. Electronically Signed   By: Albin Felling M.D.   On: 08/30/2021 14:21   CT ABDOMEN PELVIS WO CONTRAST  Result Date: 08/29/2021 CLINICAL DATA:  Post right-sided renal biopsy, now with hematuria and hypotension. EXAM: CT ABDOMEN AND PELVIS WITHOUT CONTRAST TECHNIQUE: Multidetector CT imaging of the abdomen and pelvis was performed following the standard protocol without IV contrast. COMPARISON:  CT abdomen pelvis-08/23/2021; ultrasound-guided right renal biopsy-earlier same day FINDINGS: The lack of intravenous contrast limits the ability to evaluate solid abdominal organs. Lower chest: Limited visualization of the lower thorax demonstrates interval development of trace bilateral effusion with worsening bibasilar heterogeneous/consolidative opacities, right greater than left. Previously identified nonspecific ground-glass opacities within the imaged lung bases is not seen on the present examination though there is mild residual intraseptal thickening. Normal heart size. Trace amount of pericardial fluid, unchanged presumably physiologic. There is diffuse decreased attenuation intra cardiac blood pool suggestive of anemia. Hepatobiliary:  Normal hepatic contour. Apparent high density material within the gallbladder could represent biliary sludge. No definitive gallbladder wall thickening or pericholecystic stranding on this noncontrast examination. No ascites. Pancreas: Normal noncontrast appearance of the pancreas. Spleen: Normal noncontrast appearance of the spleen. Adrenals/Urinary Tract: There is a very tiny (approximately 2.7 x 1.6 x 1.2 cm) perinephric hematoma about the inferior pole of the right kidney (axial image 44, series 3; coronal image 57, series 6), with minimal amount of adjacent perinephric stranding. High-density material is seen within the right renal collecting system and ureter with moderate to large amount of layering high-density material within urinary bladder, findings compatible with hemorrhage into the collecting system and bladder. Mild associated right-sided pelviectasis and ureterectasis. Normal noncontrast appearance of the left kidney. No evidence of left-sided nephrolithiasis or urinary obstruction. Normal noncontrast appearance of the bilateral adrenal glands. Stomach/Bowel: Scattered minimal colonic diverticulosis without evidence of superimposed acute diverticulitis on this noncontrast examination. Normal appearance of the terminal ileum. The appendix is not visualized compatible with provided operative history. No discrete areas of bowel wall thickening on this noncontrast examination. No pneumoperitoneum, pneumatosis or portal venous gas. Vascular/Lymphatic: Moderate amount of atherosclerotic plaque within normal caliber abdominal aorta. Scattered retroperitoneal lymph nodes are numerous though individually not enlarged by size criteria with index left sided periaortic lymph node measuring 0.7 cm in greatest short axis diameter (image 31, series 3), presumably reactive in etiology. No bulky retroperitoneal, mesenteric, pelvic or inguinal lymphadenopathy on this noncontrast examination Reproductive: Dystrophic  calcifications within normal sized prostate gland. Trace amount of fluid within the pelvic cul-de-sac. Other: Small bilateral mesenteric fat containing inguinal hernias, left greater than right. Minimal amount of subcutaneous  edema about the midline of the low back. Presumed shrapnel is seen within the right lower abdominal/pelvic ventral abdominal wall. Musculoskeletal: No acute or aggressive osseous abnormalities. Mild-to-moderate multilevel lumbar spine DDD, worse at L4-L5 and L5-S1 with disc space height loss, endplate irregularity and small posteriorly directed disc osteophyte complexes at these locations. Mild degenerative change the bilateral hips with joint space loss, subchondral sclerosis and osteophytosis, right greater than left. IMPRESSION: 1. Post right-sided renal biopsy complicated by tiny (approximately 2.7 cm) perinephric hematoma and bleeding into the right renal collecting system including moderate to large-sized clot within the urinary bladder and associated mild right-sided pelviectasis and ureterectasis. Consideration for initiation of continuous bladder irrigation could be performed as indicated. 2. Trace bilateral effusions with associated bibasilar opacities, right greater than left, likely atelectasis. 3. Colonic diverticulosis without evidence superimposed acute diverticulitis. 4.  Aortic Atherosclerosis (ICD10-I70.0). Critical Value/emergent results were called by telephone at the time of interpretation on 08/28/2021 at 5:04 pm to provider [facility] , who verbally acknowledged these results. Electronically Signed   By: Sandi Mariscal M.D.   On: 08/29/2021 10:38   DG Chest 2 View  Result Date: 09/06/2021 CLINICAL DATA:  Fever EXAM: CHEST - 2 VIEW COMPARISON:  09/03/2021 FINDINGS: Right IJ dialysis catheter tip med right atrium. Midline trachea. Normal heart size. Left costophrenic angle minimally excluded the frontal radiograph. Small bilateral pleural effusions. No pneumothorax.  Improved interstitial edema with minimal pulmonary venous congestion remaining. Persistent mild bibasilar atelectasis. IMPRESSION: Improved interstitial edema with mild pulmonary venous congestion remaining. Small bilateral pleural effusions with adjacent atelectasis. Electronically Signed   By: Abigail Miyamoto M.D.   On: 09/06/2021 13:49   DG Chest 2 View  Result Date: 08/23/2021 CLINICAL DATA:  Near syncope. EXAM: CHEST - 2 VIEW COMPARISON:  None. FINDINGS: The lungs are clear without focal pneumonia, edema, pneumothorax or pleural effusion. Cardiopericardial silhouette is at upper limits of normal for size. The visualized bony structures of the thorax show no acute abnormality. Telemetry leads overlie the chest. IMPRESSION: No active cardiopulmonary disease. Electronically Signed   By: Misty Stanley M.D.   On: 08/23/2021 09:15   NM Pulmonary Perfusion  Result Date: 08/23/2021 CLINICAL DATA:  PE suspected, shortness of breath EXAM: NUCLEAR MEDICINE PERFUSION LUNG SCAN TECHNIQUE: Perfusion images were obtained in multiple projections after intravenous injection of radiopharmaceutical. Ventilation scans intentionally deferred if perfusion scan and chest x-ray adequate for interpretation during COVID 19 epidemic. RADIOPHARMACEUTICALS:  4.2 mCi Tc-72mMAA IV COMPARISON:  Same-day chest radiographs FINDINGS: Normal, homogeneous pulmonary perfusion. No suspicious filling defects. IMPRESSION: Very low probability examination for pulmonary embolism by modified perfusion only PIOPED criteria (PE absent). Electronically Signed   By: AEddie CandleM.D.   On: 08/23/2021 15:59   IR Angiogram Renal Left Selective  INDICATION: 70year old male with history of acute kidney injury of uncertain etiology status post ultrasound-guided right renal biopsy on 08/28/2021. Since biopsy, the patient has experienced gross hematuria with associated acute anemia.   EXAM: 1. Ultrasound-guided vascular access of the right internal jugular  vein. 2. Temporary hemodialysis catheter placement. 3. Ultrasound-guided vascular access of the right common femoral artery. 4. Selective catheterization and angiography of the right renal artery. 5. Sub selective catheterization angiography of right inferior polar and arcuate artery branches. 6. Coil embolization of right inferior pole arcuate artery branch.   MEDICATIONS: None.   ANESTHESIA/SEDATION: Moderate (conscious) sedation was employed during this procedure. A total of Versed 3 mg and Fentanyl 50 mcg was administered intravenously.  Moderate Sedation Time: 66 minutes. The patient's level of consciousness and vital signs were monitored continuously by radiology nursing throughout the procedure under my direct supervision.   CONTRAST:  5m OMNIPAQUE IOHEXOL 350 MG/ML SOLN, 569mOMNIPAQUE IOHEXOL 350 MG/ML SOLN   FLUOROSCOPY TIME:  Fluoroscopy Time: 10.6 minutes, (811 mGy).   COMPLICATIONS: None immediate.   PROCEDURE: Informed consent was obtained from the patient following explanation of the procedure, risks, benefits and alternatives. The patient understands, agrees and consents for the procedure. All questions were addressed. A time out was performed prior to the initiation of the procedure. Maximal barrier sterile technique utilized including caps, mask, sterile gowns, sterile gloves, large sterile drape, hand hygiene, and chlorhexidine prep.   Preprocedure ultrasound evaluation of the right internal jugular vein demonstrated a patent and compressible vein free of internal echoes. Procedure was planned. Subdermal Local anesthesia was provided 1% lidocaine. A small skin nick was made. Under direct ultrasound visualization, a 21 gauge micropuncture needle was directed into the internal jugular vein. An image was captured and stored in the permanent record. A micropuncture set was inserted and exchanged for a J wire which was positioned in the inferior vena cava. Serial dilation was performed followed by  placement of a 12.5 French, 24 cm Trialysis catheter. The catheter tip was positioned in the right atrium. Each lumen flushed and aspirated appropriately. The dialysis ports were locked with appropriate volume of heparin dwell. The middle central venous port was then used for sedation for the remainder of the procedure. The catheter was secured with a 0 silk retention suture. A sterile bandage was applied.   Preprocedure ultrasound evaluation of the right groin was performed which demonstrated a patent right common femoral artery. The procedure was planned. Subdermal Local anesthesia was provided with 1% lidocaine. A small skin nick was made. Under direct ultrasound visualization, a 21 gauge micropuncture needle was directed into the common femoral artery. An ultrasound image was captured and stored in the permanent record. A micro puncture set was inserted and a limited right lower extremity angiogram was performed which demonstrated appropriate puncture site for closure device use. A J wire was directed to the abdominal aorta and the micropuncture set was exchanged for a 5 Fr[place] sheath. A 5 French C2 catheter was then directed into the right renal ostium. Right renal angiogram was performed. The single main renal artery was patent. 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   A straight lantern microcatheter and 0.014" soft synchro wire was then inserted and directed into the inferior polar branch. Repeat angiogram was performed in the sub selective location which was significant for multifocal 2-4 mm pseudoaneurysm formation, abnormal truncation and irregularity of the arcuate and interlobular branches, an early arteriovenous shunting. The inferior polar arcuate branch was selected further. Coil  embolization was performed with multiple low profile Penumbra Ruby coils ranging from 2-3 mm in diameter. Completion right inferior renal angiogram was performed which demonstrated appropriate embolization of the targeted vessels without persistent arteriovenous fistula or vessel irregularity.   The catheters were removed. The right common femoral artery was then closed with a 6 FrPakistanngio-Seal device. Distal pulses were unchanged. The patient tolerated the procedure well was transferred back to the floor in good condition.   IMPRESSION: 1. 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 2. Sub selective coil embolization of right inferior polar arcuate artery branch. 3. Successful placement of right internal jugular, 24 French Trialysis catheter with the catheter tip in the right atrium. The catheter is ready for immediate use.   Ruthann Cancer, MD   [facility]   [facility]     Electronically Signed   By: Ruthann Cancer M.D.   On: 08/31/2021 09:29    IR Fluoro Guide CV Line Right  Result Date: 08/31/2021 INDICATION: 70 year old male with history of acute kidney injury of uncertain etiology status post ultrasound-guided right renal biopsy on 08/28/2021. Since biopsy, the patient has experienced gross hematuria with associated acute anemia. EXAM: 1. Ultrasound-guided vascular access of the right internal jugular vein. 2. Temporary hemodialysis catheter placement. 3. Ultrasound-guided vascular access of the right common femoral artery. 4. Selective catheterization and angiography of the right renal artery. 5. Sub selective catheterization angiography of right inferior polar and arcuate artery branches. 6. Coil embolization of right inferior pole arcuate artery branch. MEDICATIONS: None. ANESTHESIA/SEDATION: Moderate (conscious)  sedation was employed during this procedure. A total of Versed 3 mg and Fentanyl 50 mcg was administered intravenously. Moderate Sedation Time: 66 minutes. The patient's level of consciousness and vital signs were monitored continuously by radiology nursing throughout the procedure under my direct supervision. CONTRAST:  20m OMNIPAQUE IOHEXOL 350 MG/ML SOLN, 514mOMNIPAQUE IOHEXOL 350 MG/ML SOLN FLUOROSCOPY TIME:  Fluoroscopy Time: 10.6 minutes, (811 mGy). COMPLICATIONS: None immediate. PROCEDURE: Informed consent was obtained from the patient following explanation of the procedure, risks, benefits and alternatives. The patient understands, agrees and consents for the procedure. All questions were addressed. A time out was performed prior to the initiation of the procedure. Maximal barrier sterile technique utilized including caps, mask, sterile gowns, sterile gloves, large sterile drape, hand hygiene, and chlorhexidine prep. Preprocedure ultrasound evaluation of the right internal jugular vein demonstrated a patent and compressible vein free of internal echoes. Procedure was planned. Subdermal Local anesthesia was provided 1% lidocaine. A small skin nick was made. Under direct ultrasound visualization, a 21 gauge micropuncture needle was directed into the internal jugular vein. An image was captured and stored in the permanent record. A micropuncture set was inserted and exchanged for a J wire which was positioned in the inferior vena cava. Serial dilation was performed followed by placement of a 12.5 French, 24 cm Trialysis catheter. The catheter tip was positioned in the right atrium. Each lumen flushed and aspirated appropriately. The dialysis ports were locked with appropriate volume of heparin dwell. The middle central venous port was then used for sedation for the remainder of the procedure. The catheter was secured with a 0 silk retention suture. A sterile bandage was applied. Preprocedure ultrasound  evaluation of the right groin was performed which demonstrated a patent right common femoral artery. The procedure was planned. Subdermal Local anesthesia was provided with 1% lidocaine. A small skin nick was made. Under direct ultrasound visualization, a 21 gauge micropuncture needle was directed into the common femoral artery. An ultrasound image was captured and stored in the permanent record. A micro puncture set was inserted and a limited right lower extremity angiogram was performed which demonstrated appropriate puncture site for closure device use. A J wire was directed to the abdominal aorta and the micropuncture set was exchanged for a 5 Fr[place]ascular sheath. A 5 French C2 catheter was then  directed into the right renal ostium. Right renal angiogram was performed. The single main renal artery was patent. 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 A straight lantern microcatheter and 0.014" soft synchro wire was then inserted and directed into the inferior polar branch. Repeat angiogram was performed in the sub selective location which was significant for multifocal 2-4 mm pseudoaneurysm formation, abnormal truncation and irregularity of the arcuate and interlobular branches, an early arteriovenous shunting. The inferior polar arcuate branch was selected further. Coil embolization was performed with multiple low profile Penumbra Ruby coils ranging from 2-3 mm in diameter. Completion right inferior renal angiogram was performed which demonstrated appropriate embolization of the targeted vessels without persistent arteriovenous fistula or vessel irregularity. The catheters were removed. The right common femoral artery was then closed with a 6 [place] Angio-Seal device. Distal pulses were unchanged. The  patient tolerated the procedure well was transferred back to the floor in good condition. IMPRESSION: 1. 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 2. Sub selective coil embolization of right inferior polar arcuate artery branch. 3. Successful placement of right internal jugular, 24 French Trialysis catheter with the catheter tip in the right atrium. The catheter is ready for immediate use. Ruthann Cancer, MD [facility] Electronically Signed   By: Ruthann Cancer M.D.   On: 08/31/2021 09:29   IR US Guide Vasc Access Right  Result Date: 08/31/2021 INDICATION: 70 year old male with history of acute kidney injury of uncertain etiology status post ultrasound-guided right renal biopsy on 08/28/2021. Since biopsy, the patient has experienced gross hematuria with associated acute anemia. EXAM: 1. Ultrasound-guided vascular access of the right internal jugular vein. 2. Temporary hemodialysis catheter placement. 3. Ultrasound-guided vascular access of the right common femoral artery. 4. Selective catheterization and angiography of the right renal artery. 5. Sub selective catheterization angiography of right inferior polar and arcuate artery branches. 6. Coil embolization of right inferior pole arcuate artery branch. MEDICATIONS: None. ANESTHESIA/SEDATION: Moderate (conscious) sedation was employed during this procedure. A total of Versed 3 mg and Fentanyl 50 mcg was administered intravenously. Moderate Sedation Time: 66 minutes. The patient's level of consciousness and vital signs were monitored continuously by radiology nursing throughout the procedure under my direct supervision. CONTRAST:  8m OMNIPAQUE IOHEXOL 350 MG/ML SOLN, 552mOMNIPAQUE IOHEXOL 350 MG/ML SOLN FLUOROSCOPY TIME:  Fluoroscopy Time: 10.6 minutes, (811  mGy). COMPLICATIONS: None immediate. PROCEDURE: Informed consent was obtained from the patient following explanation of the procedure, risks, benefits and alternatives. The patient understands, agrees and consents for the procedure. All questions were addressed. A time out was performed prior to the initiation of the procedure. Maximal barrier sterile technique utilized including caps, mask, sterile gowns, sterile gloves, large sterile drape, hand hygiene, and chlorhexidine prep. Preprocedure ultrasound evaluation of the right internal jugular vein demonstrated a patent and compressible vein free of internal echoes. Procedure was planned. Subdermal Local anesthesia was provided 1% lidocaine. A small skin nick was made. Under direct ultrasound visualization, a 21 gauge micropuncture needle was directed into the internal jugular vein. An image was captured and stored in the permanent record. A micropuncture set was inserted and exchanged for a J wire which was positioned in the  inferior vena cava. Serial dilation was performed followed by placement of a 12.5 French, 24 cm Trialysis catheter. The catheter tip was positioned in the right atrium. Each lumen flushed and aspirated appropriately. The dialysis ports were locked with appropriate volume of heparin dwell. The middle central venous port was then used for sedation for the remainder of the procedure. The catheter was secured with a 0 silk retention suture. A sterile bandage was applied. Preprocedure ultrasound evaluation of the right groin was performed which demonstrated a patent right common femoral artery. The procedure was planned. Subdermal Local anesthesia was provided with 1% lidocaine. A small skin nick was made. Under direct ultrasound visualization, a 21 gauge micropuncture needle was directed into the common femoral artery. An ultrasound image was captured and stored in the permanent record. A micro puncture set was inserted and a limited right lower  extremity angiogram was performed which demonstrated appropriate puncture site for closure device use. A J wire was directed to the abdominal aorta and the micropuncture set was exchanged for a 5 [place] vascular sheath. A 5 French C2 catheter was then directed into the right renal ostium. Right renal angiogram was performed. The single main renal artery was patent. 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 A straight lantern microcatheter and 0.014" soft synchro wire was then inserted and directed into the inferior polar branch. Repeat angiogram was performed in the sub selective location which was significant for multifocal 2-4 mm pseudoaneurysm formation, abnormal truncation and irregularity of the arcuate and interlobular branches, an early arteriovenous shunting. The inferior polar arcuate branch was selected further. Coil embolization was performed with multiple low profile Penumbra Ruby coils ranging from 2-3 mm in diameter. Completion right inferior renal angiogram was performed which demonstrated appropriate embolization of the targeted vessels without persistent arteriovenous fistula or vessel irregularity. The catheters were removed. The right common femoral artery was then closed with a 6 [place] Angio-Seal device. Distal pulses were unchanged. The patient tolerated the procedure well was transferred back to the floor in good condition. IMPRESSION: 1. 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 2. Sub selective coil embolization of right inferior polar arcuate artery branch. 3. Successful placement of right internal jugular, 24 French Trialysis catheter  with the catheter tip in the right atrium. The catheter is ready for immediate use. Ruthann Cancer, MD [facility] Radiology Electronically Signed   By: Ruthann Cancer M.D.   On: 08/31/2021 09:29   IR US Guide Vasc Access Right  Result Date: 08/31/2021 INDICATION: 70 year old male with history of acute kidney injury of uncertain etiology status post ultrasound-guided right renal biopsy on 08/28/2021. Since biopsy, the patient has experienced gross hematuria with associated acute anemia. EXAM: 1. Ultrasound-guided vascular access of the right internal jugular vein. 2. Temporary hemodialysis catheter placement. 3. Ultrasound-guided vascular access of the right common femoral artery. 4. Selective catheterization and angiography of the right renal artery. 5. Sub selective catheterization angiography of right inferior polar and arcuate artery branches. 6. Coil embolization of right inferior pole arcuate artery branch. MEDICATIONS: None. ANESTHESIA/SEDATION: Moderate (conscious) sedation was employed during this procedure. A total of Versed 3 mg and Fentanyl 50 mcg was administered intravenously. Moderate Sedation  Time: 66 minutes. The patient's level of consciousness and vital signs were monitored continuously by radiology nursing throughout the procedure under my direct supervision. CONTRAST:  66m OMNIPAQUE IOHEXOL 350 MG/ML SOLN, 510mOMNIPAQUE IOHEXOL 350 MG/ML SOLN FLUOROSCOPY TIME:  Fluoroscopy Time: 10.6 minutes, (811 mGy). COMPLICATIONS: None immediate. PROCEDURE: Informed consent was obtained from the patient following explanation of the procedure, risks, benefits and alternatives. The patient understands, agrees and consents for the procedure. All questions were addressed. A time out was performed prior to the initiation of the procedure. Maximal barrier sterile technique utilized including caps, mask, sterile gowns, sterile gloves, large sterile drape, hand  hygiene, and chlorhexidine prep. Preprocedure ultrasound evaluation of the right internal jugular vein demonstrated a patent and compressible vein free of internal echoes. Procedure was planned. Subdermal Local anesthesia was provided 1% lidocaine. A small skin nick was made. Under direct ultrasound visualization, a 21 gauge micropuncture needle was directed into the internal jugular vein. An image was captured and stored in the permanent record. A micropuncture set was inserted and exchanged for a J wire which was positioned in the inferior vena cava. Serial dilation was performed followed by placement of a 12.5 French, 24 cm Trialysis catheter. The catheter tip was positioned in the right atrium. Each lumen flushed and aspirated appropriately. The dialysis ports were locked with appropriate volume of heparin dwell. The middle central venous port was then used for sedation for the remainder of the procedure. The catheter was secured with a 0 silk retention suture. A sterile bandage was applied. Preprocedure ultrasound evaluation of the right groin was performed which demonstrated a patent right common femoral artery. The procedure was planned. Subdermal Local anesthesia was provided with 1% lidocaine. A small skin nick was made. Under direct ultrasound visualization, a 21 gauge micropuncture needle was directed into the common femoral artery. An ultrasound image was captured and stored in the permanent record. A micro puncture set was inserted and a limited right lower extremity angiogram was performed which demonstrated appropriate puncture site for closure device use. A J wire was directed to the abdominal aorta and the micropuncture set was exchanged for a 5 [place] sheath. A 5 French C2 catheter was then directed into the right renal ostium. Right renal angiogram was performed. The single main renal artery was patent. About 2 arcuate artery branches in the inferior pole there is abnormal truncation and  vessel irregularity with evidence of an early filling arteriovenous fistula in addition to suggestion of faint filling into the collecting system on delayed imaging. A straight lantern microcatheter and 0.014" soft synchro wire was then inserted and directed into the inferior polar branch. Repeat angiogram was performed in the sub selective location which was significant for multifocal 2-4 mm pseudoaneurysm formation, abnormal truncation and irregularity of the arcuate and interlobular branches, an early arteriovenous shunting. The inferior polar arcuate branch was selected further. Coil embolization was performed with multiple low profile Penumbra Ruby coils ranging from 2-3 mm in diameter. Completion right inferior renal angiogram was performed which demonstrated appropriate embolization of the targeted vessels without persistent arteriovenous fistula or vessel irregularity. The catheters were removed. The right common femoral artery was then closed with a 6 [place]-Seal device. Distal pulses were unchanged. The patient tolerated the procedure well was transferred back to the floor in good condition. IMPRESSION: 1. 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 2. Sub  selective coil embolization of right inferior polar arcuate artery branch. 3. Successful placement of right internal jugular, 24 French Trialysis catheter with the catheter tip in the right atrium. The catheter is ready for immediate use. Ruthann Cancer, MD [facility] Radiology Electronically Signed   By: Ruthann Cancer M.D.   On: 08/31/2021 09:29   DG Chest Port 1 View  Result Date: 09/03/2021 CLINICAL DATA:  Shortness of breath EXAM: PORTABLE CHEST 1 VIEW COMPARISON:  08/23/2021 FINDINGS: Right dialysis catheter in place with the tip in the right  atrium. Heart is normal size. Mild vascular congestion and interstitial prominence throughout the lungs, likely mild edema. No effusions or acute bony abnormality. IMPRESSION: Suspect mild pulmonary edema. Electronically Signed   By: Rolm Baptise M.D.   On: 09/03/2021 09:40   DG Bone Survey Met  Result Date: 09/08/2021 CLINICAL DATA:  Left hip pain. EXAM: METASTATIC BONE SURVEY COMPARISON:  None. FINDINGS: No metastatic lesions identified. No cause for left hip pain identified. IMPRESSION: No acute abnormalities identified. No metastatic lesions. No cause for hip pain noted. Electronically Signed   By: Dorise Bullion III M.D.   On: 09/08/2021 14:32   MR CARDIAC MORPHOLOGY WO CONTRAST  Result Date: 09/10/2021 CLINICAL DATA:  Concern for cardiac amyloidosis. EXAM: CARDIAC MRI TECHNIQUE: The patient was scanned on a 1.5 Tesla GE magnet. A dedicated cardiac coil was used. Functional imaging was done using Fiesta sequences. 2,3, and 4 chamber views were done to assess for RWMA's. Modified Simpson's rule using a short axis stack was used to calculate an ejection fraction on a dedicated work Conservation officer, nature. T1 and T2 sequences done. No contrast due to AKI and unstable renal function. CONTRAST:  None FINDINGS: Limited images of the lung fields showed small bilateral pleural effusions. Small inferior pericardial effusion. Normal left ventricular size with moderate LV hypertrophy. Moderate diffuse hypokinesis with EF 38%. Normal right ventricular size with mild-moderate dysfunction, EF 36%. The aortic valve is trileaflet with no significant stenosis or regurgitation. Normal right atrial size. Mild left atrial enlargement. No significant mitral regurgitation. Measurements: T1 1217 T2 53 LVEDV 134 mL LVSV 51 mL LVEF 38% RVEDV 98 mL RVSV 35 mL RVEF 36% IMPRESSION: 1. Normal LV size with moderate LV hypertrophy. EF 38% with diffuse hypokinesis. 2.  Normal RV size with EF 36%. 3.  T1 elevated, T2 borderline  elevated. 4.  Small inferior pericardial effusion. 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 Dalton Mclean Electronically Signed   By: Loralie Champagne M.D.   On: 09/10/2021 17:55   CT BONE MARROW BIOPSY & ASPIRATION  Result Date: 09/05/2021 CLINICAL DATA:  Amyloid kidney EXAM: CT GUIDED DEEP ILIAC BONE ASPIRATION AND CORE BIOPSY TECHNIQUE: Patient was placed prone on the CT gantry and limited axial scans through the pelvis were obtained. Appropriate skin entry site was identified. Skin site was marked, prepped with chlorhexidine, draped in usual sterile fashion, and infiltrated locally with 1% lidocaine. Intravenous Fentanyl 13mg and Versed 239mwere administered as conscious sedation during continuous monitoring of the patient's level of consciousness and physiological / cardiorespiratory status by the radiology RN, with a total moderate sedation time of 10 minutes. Under CT fluoroscopic guidance an 11-gauge Cook trocar bone needle was advanced into the right iliac bone just lateral to the sacroiliac joint. Once needle tip position was confirmed, core and aspiration  samples were obtained, submitted to pathology for approval. Post procedure scans show no hematoma or fracture. Patient tolerated procedure well. COMPLICATIONS: COMPLICATIONS none IMPRESSION: 1. Technically successful CT guided right iliac bone core and aspiration biopsy. Electronically Signed   By: Lucrezia Europe M.D.   On: 09/05/2021 10:48   ECHOCARDIOGRAM COMPLETE  Result Date: 09/08/2021    ECHOCARDIOGRAM REPORT   Patient Name:   Caleb Simmons Date of Exam: 09/08/2021 Medical Rec #:  [id number]    Height:       70.0 in Accession #:    [id number]   Weight:       156.5 lb Date of Birth:  07-19-1951     BSA:          1.881 m Patient Age:    70 years     BP:           130/78 mmHg Patient Gender: M            HR:            75 bpm. Exam Location:  Inpatient Procedure: 2D Echo, Cardiac Doppler and Color Doppler Indications:    Bacteremia R78.81  History:        Patient has prior history of Echocardiogram examinations, most                 recent 08/24/2021. Risk Factors:Current Smoker. Generalized                 weakness/shortness of breath-found to have AKI and severe                 normocytic anemia.  Sonographer:    Alvino Chapel RCS Referring Phys: [id number] [place]  1. Left ventricular ejection fraction, by estimation, is 45 to 50%. The left ventricle has mildly decreased function. The left ventricle demonstrates global hypokinesis. There is moderate concentric left ventricular hypertrophy. Left ventricular diastolic parameters are consistent with Grade II diastolic dysfunction (pseudonormalization).  2. Right ventricular systolic function is normal. The right ventricular size is normal. Tricuspid regurgitation signal is inadequate for assessing PA pressure.  3. Left atrial size was moderately dilated.  4. A small to moderate pericardial effusion is present. The pericardial effusion is anterior to the right ventricle. There is no evidence of cardiac tamponade.  5. The mitral valve is grossly normal. Mild mitral valve regurgitation.  6. The aortic valve is tricuspid. There is mild thickening of the aortic valve. Aortic valve regurgitation is not visualized.  7. The inferior vena cava is normal in size with greater than 50% respiratory variability, suggesting right atrial pressure of 3 mmHg.  8. No obvious valvular vegetations. Comparison(s): Prior images reviewed side by side. LVEF mildly reduced and pericardial effusion is new. FINDINGS  Left Ventricle: Left ventricular ejection fraction, by estimation, is 45 to 50%. The left ventricle has mildly decreased function. The left ventricle demonstrates global hypokinesis. The left ventricular internal cavity size was normal in size. There is  moderate concentric  left ventricular hypertrophy. Left ventricular diastolic parameters are consistent with Grade II diastolic dysfunction (pseudonormalization). Right Ventricle: The right ventricular size is normal. No increase in right ventricular wall thickness. Right ventricular systolic function is normal. Tricuspid regurgitation signal is inadequate for assessing PA pressure. Left Atrium: Left atrial size was moderately dilated. Right Atrium: Right atrial size was normal in size. Pericardium: A small pericardial effusion is present. The pericardial effusion is anterior to the right ventricle. There is no evidence  of cardiac tamponade. Mitral Valve: The mitral valve is grossly normal. There is mild thickening of the mitral valve leaflet(s). Mild mitral annular calcification. Mild mitral valve regurgitation. Tricuspid Valve: The tricuspid valve is grossly normal. Tricuspid valve regurgitation is trivial. Aortic Valve: The aortic valve is tricuspid. There is mild thickening of the aortic valve. There is mild to moderate aortic valve annular calcification. Aortic valve regurgitation is not visualized. Pulmonic Valve: The pulmonic valve was grossly normal. Pulmonic valve regurgitation is trivial. Aorta: The aortic root is normal in size and structure. Venous: The inferior vena cava is normal in size with greater than 50% respiratory variability, suggesting right atrial pressure of 3 mmHg. IAS/Shunts: No atrial level shunt detected by color flow Doppler.  LEFT VENTRICLE PLAX 2D LVIDd:         5.10 cm  Diastology LVIDs:         3.90 cm  LV e' medial:    3.77 cm/s LV PW:         1.50 cm  LV E/e' medial:  22.9 LV IVS:        1.40 cm  LV e' lateral:   7.18 cm/s LVOT diam:     2.00 cm  LV E/e' lateral: 12.0 LV SV:         51 LV SV Index:   27 LVOT Area:     3.14 cm  RIGHT VENTRICLE RV S prime:     14.40 cm/s TAPSE (M-mode): 1.9 cm LEFT ATRIUM              Index       RIGHT ATRIUM           Index LA diam:        4.20 cm  2.23 cm/m  RA  Area:     14.50 cm LA Vol (A2C):   104.0 ml 55.29 ml/m RA Volume:   36.00 ml  19.14 ml/m LA Vol (A4C):   64.4 ml  34.24 ml/m LA Biplane Vol: 83.7 ml  44.50 ml/m  AORTIC VALVE LVOT Vmax:   89.00 cm/s LVOT Vmean:  53.600 cm/s LVOT VTI:    0.161 m  AORTA Ao Root diam: 3.50 cm MITRAL VALVE MV Area (PHT): 3.65 cm    SHUNTS MV Decel Time: 208 msec    Systemic VTI:  0.16 m MV E velocity: 86.40 cm/s  Systemic Diam: 2.00 cm MV A velocity: 63.70 cm/s MV E/A ratio:  1.36 Rozann Lesches MD Electronically signed by Rozann Lesches MD Signature Date/Time: 09/08/2021/5:26:36 PM    Final    ECHOCARDIOGRAM COMPLETE  Result Date: 08/24/2021    ECHOCARDIOGRAM REPORT   Patient Name:   ZACHERIAH STUMPE Date of Exam: 08/24/2021 Medical Rec #:  [id number]    Height:       70.0 in Accession #:    [id number]   Weight:       180.0 lb Date of Birth:  August 16, 1951     BSA:          1.996 m Patient Age:    47 years     BP:           120/69 mmHg Patient Gender: M            HR:           85 bpm. Exam Location:  Inpatient Procedure: 2D Echo, Cardiac Doppler and Color Doppler Indications:    Abnormal EKG  History:        Patient  has no prior history of Echocardiogram examinations.                 COPD, Signs/Symptoms:Dyspnea and Weakness, renal disease,                 anemia, elevated troponin; Risk Factors:Current Smoker.  Sonographer:    Dustin Flock RDCS Referring Phys: [place]  Sonographer Comments: Image acquisition challenging due to COPD. IMPRESSIONS  1. Left ventricular ejection fraction, by estimation, is 55 to 60%. The left ventricle has normal function. The left ventricle has no regional wall motion abnormalities. There is mild concentric left ventricular hypertrophy. Left ventricular diastolic parameters are consistent with Grade I diastolic dysfunction (impaired relaxation).  2. Right ventricular systolic function is normal. The right ventricular size is normal. Tricuspid regurgitation signal is inadequate for  assessing PA pressure.  3. The mitral valve is grossly normal. No evidence of mitral valve regurgitation. No evidence of mitral stenosis.  4. The aortic valve was not well visualized. Aortic valve regurgitation is not visualized. No aortic stenosis is present.  5. The inferior vena cava is normal in size with greater than 50% respiratory variability, suggesting right atrial pressure of 3 mmHg. Comparison(s): No prior Echocardiogram. FINDINGS  Left Ventricle: Left ventricular ejection fraction, by estimation, is 55 to 60%. The left ventricle has normal function. The left ventricle has no regional wall motion abnormalities. The left ventricular internal cavity size was normal in size. There is  mild concentric left ventricular hypertrophy. Left ventricular diastolic parameters are consistent with Grade I diastolic dysfunction (impaired relaxation). Right Ventricle: The right ventricular size is normal. No increase in right ventricular wall thickness. Right ventricular systolic function is normal. Tricuspid regurgitation signal is inadequate for assessing PA pressure. Left Atrium: Left atrial size was normal in size. Right Atrium: Right atrial size was normal in size. Pericardium: There is no evidence of pericardial effusion. Mitral Valve: The mitral valve is grossly normal. No evidence of mitral valve regurgitation. No evidence of mitral valve stenosis. Tricuspid Valve: The tricuspid valve is normal in structure. Tricuspid valve regurgitation is not demonstrated. No evidence of tricuspid stenosis. Aortic Valve: The aortic valve was not well visualized. Aortic valve regurgitation is not visualized. No aortic stenosis is present. Pulmonic Valve: The pulmonic valve was not well visualized. Pulmonic valve regurgitation is not visualized. Aorta: The aortic root is normal in size and structure. Venous: The inferior vena cava is normal in size with greater than 50% respiratory variability, suggesting right atrial pressure of  3 mmHg. IAS/Shunts: The atrial septum is grossly normal.  LEFT VENTRICLE PLAX 2D LVIDd:         5.80 cm      Diastology LVIDs:         3.60 cm      LV e' medial:    5.55 cm/s LV PW:         1.30 cm      LV E/e' medial:  14.5 LV IVS:        1.30 cm      LV e' lateral:   6.74 cm/s LVOT diam:     2.40 cm      LV E/e' lateral: 12.0 LV SV:         76 LV SV Index:   38 LVOT Area:     4.52 cm  LV Volumes (MOD) LV vol d, MOD A4C: 124.0 ml LV vol s, MOD A4C: 47.8 ml LV SV MOD A4C:  124.0 ml RIGHT VENTRICLE RV Basal diam:  2.80 cm RV S prime:     10.10 cm/s TAPSE (M-mode): 2.9 cm LEFT ATRIUM             Index       RIGHT ATRIUM           Index LA diam:        3.80 cm 1.90 cm/m  RA Area:     13.10 cm LA Vol (A2C):   31.8 ml 15.93 ml/m RA Volume:   29.70 ml  14.88 ml/m LA Vol (A4C):   33.0 ml 16.53 ml/m LA Biplane Vol: 34.9 ml 17.49 ml/m  AORTIC VALVE LVOT Vmax:   98.30 cm/s LVOT Vmean:  60.300 cm/s LVOT VTI:    0.169 m  AORTA Ao Root diam: 3.30 cm MITRAL VALVE MV Area (PHT): 3.85 cm    SHUNTS MV Decel Time: 197 msec    Systemic VTI:  0.17 m MV E velocity: 80.60 cm/s  Systemic Diam: 2.40 cm MV A velocity: 49.30 cm/s MV E/A ratio:  1.63 Rudean Haskell MD Electronically signed by Rudean Haskell MD Signature Date/Time: 08/24/2021/11:38:40 AM    Final    CT Renal Stone Study  Result Date: 08/23/2021 CLINICAL DATA:  Hematuria, new renal failure EXAM: CT ABDOMEN AND PELVIS WITHOUT CONTRAST TECHNIQUE: Multidetector CT imaging of the abdomen and pelvis was performed following the standard protocol without IV contrast. COMPARISON:  06/26/2007 FINDINGS: Lower chest: There is mild, scattered, nonspecific ground-glass and fine nodularity throughout the included bilateral lung bases (series 5, 4). Hepatobiliary: No solid liver abnormality is seen. No gallstones, gallbladder wall thickening, or biliary dilatation. Pancreas: Unremarkable. No pancreatic ductal dilatation or surrounding inflammatory changes. Spleen:  Normal in size without significant abnormality. Adrenals/Urinary Tract: Adrenal glands are unremarkable. Kidneys are normal, without renal calculi, solid lesion, or hydronephrosis. Distended urinary bladder, measuring at least 17.4 cm. Stomach/Bowel: Stomach is within normal limits. Appendix not clearly visualized and may be surgically. No evidence of bowel wall thickening, distention, or inflammatory changes. Descending and sigmoid diverticulosis. Vascular/Lymphatic: Aortic atherosclerosis. No enlarged abdominal or pelvic lymph nodes. Reproductive: No mass or other significant abnormality. Other: Small, fat containing bilateral inguinal hernias no abdominopelvic ascites. Musculoskeletal: No acute or significant osseous findings. IMPRESSION: 1. No evidence of urinary tract calculus or hydronephrosis. 2. Distended urinary bladder, measuring at least 17.4 cm. Correlate for urinary retention. 3. Descending and sigmoid diverticulosis without evidence of acute diverticulitis. 4. Mild, nonspecific scattered ground-glass and fine nodularity throughout the bilateral lung bases, possibly infectious or inflammatory. Aortic Atherosclerosis (ICD10-I70.0). Electronically Signed   By: Eddie Candle M.D.   On: 08/23/2021 16:04   US BIOPSY (KIDNEY)  Result Date: 08/28/2021 INDICATION: Acute kidney injury of uncertain etiology. Please perform image guided biopsy for tissue diagnostic purposes. EXAM: ULTRASOUND GUIDED RENAL BIOPSY COMPARISON:  CT abdomen and pelvis-08/23/2021 MEDICATIONS: None. ANESTHESIA/SEDATION: Fentanyl 100 mcg IV; Versed 2 mg IV Total Moderate Sedation time: 14 minutes; The patient was continuously monitored during the procedure by the interventional radiology nurse under my direct supervision. COMPLICATIONS: None immediate. PROCEDURE: Informed written consent was obtained from the patient after a discussion of the risks, benefits and alternatives to treatment. The patient understands and consents the  procedure. A timeout was performed prior to the initiation of the procedure. Ultrasound scanning was performed of the bilateral flanks. The inferior pole of the right kidney was selected for biopsy due to location and sonographic window. The procedure was planned. The operative site was  prepped and draped in the usual sterile fashion. The overlying soft tissues were anesthetized with 1% lidocaine with epinephrine. A 17 gauge core needle biopsy device was advanced into the inferior cortex of the right kidney and 3 core biopsies were obtained under direct ultrasound guidance. Images were saved for documentation purposes. The biopsy device was removed and hemostasis was obtained with manual compression. Post procedural scanning was negative for significant post procedural hemorrhage or additional complication. A dressing was placed. The patient tolerated the procedure well without immediate post procedural complication. IMPRESSION: Technically successful ultrasound guided right renal biopsy. Electronically Signed   By: Sandi Mariscal M.D.   On: 08/28/2021 12:52   IR EMBO ART  VEN HEMORR LYMPH EXTRAV  INC GUIDE ROADMAPPING  Result Date: 08/31/2021 INDICATION: 71 year old male with history of acute kidney injury of uncertain etiology status post ultrasound-guided right renal biopsy on 08/28/2021. Since biopsy, the patient has experienced gross hematuria with associated acute anemia. EXAM: 1. Ultrasound-guided vascular access of the right internal jugular vein. 2. Temporary hemodialysis catheter placement. 3. Ultrasound-guided vascular access of the right common femoral artery. 4. Selective catheterization and angiography of the right renal artery. 5. Sub selective catheterization angiography of right inferior polar and arcuate artery branches. 6. Coil embolization of right inferior pole arcuate artery branch. MEDICATIONS: None. ANESTHESIA/SEDATION: Moderate (conscious) sedation was employed during this procedure. A total  of Versed 3 mg and Fentanyl 50 mcg was administered intravenously. Moderate Sedation Time: 66 minutes. The patient's level of consciousness and vital signs were monitored continuously by radiology nursing throughout the procedure under my direct supervision. CONTRAST:  72m OMNIPAQUE IOHEXOL 350 MG/ML SOLN, 563mOMNIPAQUE IOHEXOL 350 MG/ML SOLN FLUOROSCOPY TIME:  Fluoroscopy Time: 10.6 minutes, (811 mGy). COMPLICATIONS: None immediate. PROCEDURE: Informed consent was obtained from the patient following explanation of the procedure, risks, benefits and alternatives. The patient understands, agrees and consents for the procedure. All questions were addressed. A time out was performed prior to the initiation of the procedure. Maximal barrier sterile technique utilized including caps, mask, sterile gowns, sterile gloves, large sterile drape, hand hygiene, and chlorhexidine prep. Preprocedure ultrasound evaluation of the right internal jugular vein demonstrated a patent and compressible vein free of internal echoes. Procedure was planned. Subdermal Local anesthesia was provided 1% lidocaine. A small skin nick was made. Under direct ultrasound visualization, a 21 gauge micropuncture needle was directed into the internal jugular vein. An image was captured and stored in the permanent record. A micropuncture set was inserted and exchanged for a J wire which was positioned in the inferior vena cava. Serial dilation was performed followed by placement of a 12.5 French, 24 cm Trialysis catheter. The catheter tip was positioned in the right atrium. Each lumen flushed and aspirated appropriately. The dialysis ports were locked with appropriate volume of heparin dwell. The middle central venous port was then used for sedation for the remainder of the procedure. The catheter was secured with a 0 silk retention suture. A sterile bandage was applied. Preprocedure ultrasound evaluation of the right groin was performed which  demonstrated a patent right common femoral artery. The procedure was planned. Subdermal Local anesthesia was provided with 1% lidocaine. A small skin nick was made. Under direct ultrasound visualization, a 21 gauge micropuncture needle was directed into the common femoral artery. An ultrasound image was captured and stored in the permanent record. A micro puncture set was inserted and a limited right lower extremity angiogram was performed which demonstrated appropriate puncture site for closure device  use. A J wire was directed to the abdominal aorta and the micropuncture set was exchanged for a 5 [place] vascular sheath. A 5 French C2 catheter was then directed into the right renal ostium. Right renal angiogram was performed. The single main renal artery was patent. 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 A straight lantern microcatheter and 0.014" soft synchro wire was then inserted and directed into the inferior polar branch. Repeat angiogram was performed in the sub selective location which was significant for multifocal 2-4 mm pseudoaneurysm formation, abnormal truncation and irregularity of the arcuate and interlobular branches, an early arteriovenous shunting. The inferior polar arcuate branch was selected further. Coil embolization was performed with multiple low profile Penumbra Ruby coils ranging from 2-3 mm in diameter. Completion right inferior renal angiogram was performed which demonstrated appropriate embolization of the targeted vessels without persistent arteriovenous fistula or vessel irregularity. The catheters were removed. The right common femoral artery was then closed with a 6 [place] Angio-Seal device. Distal pulses were unchanged. The patient tolerated the procedure well was  transferred back to the floor in good condition. IMPRESSION: 1. 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 2. Sub selective coil embolization of right inferior polar arcuate artery branch. 3. Successful placement of right internal jugular, 24 French Trialysis catheter with the catheter tip in the right atrium. The catheter is ready for immediate use. Ruthann Cancer, MD [facility] Radiology Electronically Signed   By: Ruthann Cancer M.D.   On: 08/31/2021 09:29    Surgical Pathology  CASE: [id number]  PATIENT: Jvon Girardin  Bone Marrow Report   Clinical History: Amyloid , right Iliac (BH)   DIAGNOSIS:   BONE MARROW, ASPIRATE, CLOT, CORE:  - Plasma cell myeloma, see comment.   PERIPHERAL BLOOD:  - Normocytic anemia.   COMMENT:   The marrow is normocellular but exhibits increased monoclonal plasma  cells (17% aspirate, 15-20% CD138 immunohistochemistry). The findings  are consistent with plasma cell myeloma. [place] red is pending and will  be reported in an addendum. There is some atypia in the megakaryocytes  and FISH for MDS was added for completeness.   MICROSCOPIC DESCRIPTION:   PERIPHERAL BLOOD SMEAR: There is a normocytic anemia with occasional  hypochromic cells.  There is no rouleaux formation.  Leukocytes are  present in normal numbers.  Circulating plasma cells are not identified.  Platelets are present in normal numbers.   BONE MARROW ASPIRATE: Spicular and cellular.  Erythroid precursors: Relative decrease in numbers.  No significant  dysplasia.  Granulocytic precursors: Relative increase in numbers.  No significant  dysplasia.  No increase in blasts.  Megakaryocytes: Mild increase in numbers.  Occasional forms with  hypolobated or abnormal nuclei.  Lymphocytes/plasma cells:  Plasma cells are increased in numbers (6% by  manual differential counts) with atypical forms (multinucleation, large  forms).  Lymphocytes are not increased.   TOUCH PREPARATIONS: Similar to aspirate smears.   CLOT AND BIOPSY: The core biopsy and clot section are normocellular for  age (30%).  There is a mild myeloid hyperplasia.  Megakaryocytes are  increased in numbers with scattered atypical forms. CD138  immunohistochemistry reveals increased plasma cells (15-20%) which are  scattered  and with small clusters. By light chain in situ hybridization  the plasma cells are lambda restricted.   IRON STAIN: Iron stains are performed on a bone marrow aspirate or touch  imprint smear and section of clot. The controls stained appropriately.        Storage Iron: Present       Ring Sideroblasts: Absent   ADDITIONAL DATA/TESTING: Cytogenetics, including FISH for myeloma and  MDS, was ordered and will be reported in an addendum.   CELL COUNT DATA:   Bone Marrow count performed on 500 cells shows:  Blasts:   0%   Myeloid:  66%  Promyelocytes: 0%   Erythroid:     11%  Myelocytes:    8%   Lymphocytes:   6%  Metamyelocytes:     1%   Plasma cells:  17%  Bands:    8%  Neutrophils:   41%  M:E ratio:     6.0  Eosinophils:   8%  Basophils:     0%  Monocytes:     0%   Lab Data: CBC performed on 09/05/21 shows:  WBC: 8.5 k/uL  Neutrophils:   57%  Hgb: 9.6 g/dL  Lymphocytes:   27%  HCT: 30.3 %    Monocytes:     8%  MCV: 84.6 fL   Eosinophils:   6%  RDW: 17.4 %    Basophils:     2%  PLT: 380 k/uL    ASSESSMENT AND PLAN: 1.  AL amyloidosis/plasma cell myeloma 2.  Anemia secondary to renal insufficiency, iron deficiency, and folate deficiency 3.  Acute kidney injury secondary to #1 4.  History of nephrolithiasis 5.  Streptococcus agalactiae bacteremia diagnosed 09/07/2021   -The patient is status post day 1 of cycle 1 of CyBorD.  Tolerated well the exception of mild nausea.  He is using his  antiemetics which have been effective for him.  We will plan to administer day 8 of cycle one of his chemotherapy on 09/17/2021 as an inpatient.  Messages have been sent to the cancer center pharmacy and chemo administration team to arrange for this. -Thereafter, the patient may be discharged from our standpoint if otherwise medically stable.  We are working on arranging for outpatient follow-up in our office for day 15 of his chemotherapy on 10/04/2021.  We will plan to add daratumumab to his chemotherapy on day 15. -His creatinine is stable at this time.  Nephrology is following.  The patient asked about removal of his dialysis catheter.  Will defer to nephrology. -Cardiac MRI suggestive of cardiac involvement by AL amyloidosis.  Plan is to continue chemotherapy as outlined above.  Outpatient cardiology follow-up has been arranged. -Continue acyclovir 200 mg every 12 hours.  Renally dosed. -His hemoglobin remains stable.  He is status post 4 doses of IV ferric gluconate.  He is also receiving folic acid.  Transfuse for hemoglobin less than 7.5.   LOS: 21 days   Mikey Bussing, DNP, AGPCNP-BC, AOCNP 09/13/21  Addendum I have seen the patient, examined him. I agree with the assessment and and plan and have edited the notes.   Pt is clinically stable with some improvement in his mobility. Nausea is mild and controlled. Lab reviewed, renal functions stable. Pt will complete course of antibiotics on Sunday.  We plan to give him a second cycle chemo (oral dexamethasone and cyclophosphamide for and Velcade injection) on Monday, September 26.  We will make arrangement for his chemo administration.  I have  called mostly from inpatient pharmacy to make sure they have all cyclophosphamide on Monday. Okay to discharge after chemo treatment. All questions were answered.   Truitt Merle  09/13/2021

## 2021-09-14 DIAGNOSIS — E8581 Light chain (AL) amyloidosis: Secondary | ICD-10-CM | POA: Diagnosis not present

## 2021-09-14 LAB — RENAL FUNCTION PANEL
Albumin: 1.6 g/dL — ABNORMAL LOW (ref 3.5–5.0)
Anion gap: 9 (ref 5–15)
BUN: 38 mg/dL — ABNORMAL HIGH (ref 8–23)
CO2: 24 mmol/L (ref 22–32)
Calcium: 8.3 mg/dL — ABNORMAL LOW (ref 8.9–10.3)
Chloride: 102 mmol/L (ref 98–111)
Creatinine, Ser: 5.31 mg/dL — ABNORMAL HIGH (ref 0.61–1.24)
GFR, Estimated: 11 mL/min — ABNORMAL LOW (ref >60)
Glucose, Bld: 99 mg/dL (ref 70–99)
Phosphorus: 5 mg/dL — ABNORMAL HIGH (ref 2.5–4.6)
Potassium: 4.8 mmol/L (ref 3.5–5.1)
Sodium: 135 mmol/L (ref 135–145)

## 2021-09-14 LAB — CBC WITH DIFFERENTIAL/PLATELET
Abs Immature Granulocytes: 0.21 10*3/uL — ABNORMAL HIGH (ref 0.00–0.07)
Basophils Absolute: 0.2 10*3/uL — ABNORMAL HIGH (ref 0.0–0.1)
Basophils Relative: 1 %
Eosinophils Absolute: 0.3 10*3/uL (ref 0.0–0.5)
Eosinophils Relative: 3 %
HCT: 29.2 % — ABNORMAL LOW (ref 39.0–52.0)
Hemoglobin: 9.3 g/dL — ABNORMAL LOW (ref 13.0–17.0)
Immature Granulocytes: 2 %
Lymphocytes Relative: 21 %
Lymphs Abs: 2.6 10*3/uL (ref 0.7–4.0)
MCH: 27.1 pg (ref 26.0–34.0)
MCHC: 31.8 g/dL (ref 30.0–36.0)
MCV: 85.1 fL (ref 80.0–100.0)
Monocytes Absolute: 0.8 10*3/uL (ref 0.1–1.0)
Monocytes Relative: 7 %
Neutro Abs: 8.1 10*3/uL — ABNORMAL HIGH (ref 1.7–7.7)
Neutrophils Relative %: 66 %
Platelets: 434 10*3/uL — ABNORMAL HIGH (ref 150–400)
RBC: 3.43 MIL/uL — ABNORMAL LOW (ref 4.22–5.81)
RDW: 18.4 % — ABNORMAL HIGH (ref 11.5–15.5)
WBC: 12.2 10*3/uL — ABNORMAL HIGH (ref 4.0–10.5)
nRBC: 0 % (ref 0.0–0.2)

## 2021-09-14 NOTE — Progress Notes (Signed)
[facility] PROGRESS NOTE    Progress Note  Caleb Simmons  TDS:[id number] DOB: [birth year] DOA: 08/23/2021 PCP: Pcp, No     Brief Narrative:   Caleb Simmons is an 70 y.o. male past medical history of nephrolithiasis with no other medical problems, but he does not see a primary care doctor regularly comes into the ED complaining of weakness was found to be in acute kidney injury and severe normocytic anemia.  Seen by nephrology who recommended renal biopsy done on 08/28/2021 unfortunately developed post renal bleeding with clots requiring an insertion of a CBI he is status post 2 units of packed red blood cells but he continued to bleed so IR performed embolization on 08/30/2021, renal biopsy showed amyloidosis.  Oncology has been consulted.  Also found to have strep agalactiae bacteremia.  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 The plan is to continue to administer weekly chemotherapy next 1 due on 09/17/2021. Oncology recommended acyclovir.[name]  AKI (acute kidney injury) ([place]) secondary to amyloid disease: Urine output is picking up yesterday he had about 3 L of urine output.  His renal function remains poor. Nephrology is on board last HD treatment on 09/07/2021. Oncology has also been consulted for plasma cell myeloma and started on chemotherapy treatment on  09/10/2021 Continue further management per renal. Significantly nauseated this morning likely due to uremia.  Sepsis secondary to strep recollected bacteremia: 2D echo was done that showed no vegetation, but it did show a small drop in his EF. Continue diuresis penicillin and Rocephin ID was consulted who recommended treatment for total 10 days, last day on 09/16/2021.  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 The advanced heart failure team was consulted they recommended a cardiac MRI on 09/10/2021 was suggestive of cardiac amyloid, Cardiology recommended chemotherapy, oncology is on board. No diuretics needed at this time.  Cardiology has signed off.  Gross hematuria/acute urinary retention due to bleeding in the collecting duct of the right kidney post renal biopsy: Foley was discontinued 08/30/2021. He required multiple transfusions. Status post embolization on 08/30/2021. Further management per urology.  Anxiety: Continue Xanax aware that we will not continue Xanax postdischarge, continue trazodone. He is not interested in other medications like SSRI.  Tobacco abuse: Continue nicotine patch.   DVT prophylaxis: lovenxo Family Communication:noen Status is: Inpatient  Remains inpatient appropriate because:Hemodynamically unstable  Dispo: The patient is from: Home              Anticipated d/c is to: Home              Patient currently is not medically stable to d/c.   Difficult to place patient No   Code Status:     Code Status Orders  (From admission, onward)  Start     Ordered   08/23/21 1752  Full code  Continuous        08/23/21 1751           Code Status History     This patient has a current code status but no historical code status.         IV Access:   Peripheral  IV   Procedures and diagnostic studies:   No results found.   Medical Consultants:   None.   Subjective:    Caleb Simmons nauseated this morning.  Objective:    Vitals:   09/13/21 1333 09/13/21 2122 09/14/21 0642 09/14/21 0710  BP: 106/79 122/79 (!) 153/93   Pulse:  92 84   Resp: 20 17 18    Temp:  97.9 F (36.6 C) 97.9 F (36.6 C)   TempSrc:  Oral Oral   SpO2:   100%   Weight:    72.7 kg  Height:       SpO2: 100 % O2 Flow Rate (L/min): 2 L/min   Intake/Output Summary (Last 24 hours) at 09/14/2021 0800 Last data filed at 09/14/2021 0716 Gross per 24 hour  Intake 678.47 ml  Output 2700 ml  Net -2021.53 ml    Filed Weights   09/12/21 0625 09/13/21 0500 09/14/21 0710  Weight: 72.7 kg 71.8 kg 72.7 kg    Exam: General exam: In no acute distress. Respiratory system: Good air movement and clear to auscultation. Cardiovascular system: S1 & S2 heard, RRR. No JVD. Gastrointestinal system: Abdomen is nondistended, soft and nontender.  Extremities: No pedal edema. Skin: No rashes, lesions or ulcers Psychiatry: Judgement and insight appear normal. Mood & affect appropriate.  Data Reviewed:    Labs: Basic Metabolic Panel: Recent Labs  Lab 09/08/21 0530 09/09/21 0336 09/10/21 0229 09/11/21 0251 09/12/21 0217 09/13/21 0425 09/14/21 0430  NA 133*   < > 134* 131* 134* 135 135  K 3.3*   < > 4.1 4.1 4.6 4.8 4.8  CL 98   < > 98 98 101 101 102  CO2 23   < > 21* 22 22 23 24   GLUCOSE 105*   < > 118* 139* 96 96 99  BUN 32*   < > 41* 39* 39* 39* 38*  CREATININE 5.52*   < > 5.04* 4.90* 5.09* 5.09* 5.31*  CALCIUM 7.8*   < > 7.8* 7.5* 7.9* 8.1* 8.3*  MG 1.7  --   --   --   --   --   --   PHOS 3.9   < > 2.2* 3.3 3.6 4.5 5.0*   < > = values in this interval not displayed.    GFR Estimated Creatinine Clearance: 13.1 mL/min (A) (by C-G formula based on SCr of 5.31 mg/dL (H)). Liver Function Tests: Recent Labs  Lab 09/10/21 0229 09/11/21 0251 09/12/21 0217  09/13/21 0425 09/14/21 0430  ALBUMIN 1.6* 1.5* 1.6* 1.6* 1.6*    No results for input(s): LIPASE, AMYLASE in the last 168 hours. No results for input(s): AMMONIA in the last 168 hours. Coagulation profile No results for input(s): INR, PROTIME in the last 168 hours. COVID-19 Labs  No results for input(s): DDIMER, FERRITIN, LDH, CRP in the last 72 hours.  Lab Results  Component Value Date   [place] NEGATIVE 08/23/2021    CBC: Recent Labs  Lab 09/10/21 0229 09/11/21 0251 09/12/21 0217 09/13/21 0425 09/14/21 0430  WBC 13.1* 10.6* 10.7* 13.3* 12.2*  NEUTROABS 9.4* 7.0 6.5 8.8*  8.1*  HGB 9.3* 9.0* 9.6* 9.6* 9.3*  HCT 29.1* 28.5* 30.4* 30.1* 29.2*  MCV 84.3 85.8 84.9 84.3 85.1  PLT 409* 419* 473* 483* 434*    Cardiac Enzymes: No results for input(s): CKTOTAL, CKMB, CKMBINDEX, TROPONINI in the last 168 hours. BNP (last 3 results) No results for input(s): PROBNP in the last 8760 hours. CBG: No results for input(s): GLUCAP in the last 168 hours. D-Dimer: No results for input(s): DDIMER in the last 72 hours. Hgb A1c: No results for input(s): HGBA1C in the last 72 hours. Lipid Profile: No results for input(s): CHOL, HDL, LDLCALC, TRIG, CHOLHDL, LDLDIRECT in the last 72 hours. Thyroid function studies: No results for input(s): TSH, T4TOTAL, T3FREE, THYROIDAB in the last 72 hours.  Invalid input(s): FREET3 Anemia work up: No results for input(s): VITAMINB12, FOLATE, FERRITIN, TIBC, IRON, RETICCTPCT in the last 72 hours. Sepsis Labs: Recent Labs  Lab 09/08/21 0530 09/09/21 0336 09/11/21 0251 09/12/21 0217 09/13/21 0425 09/14/21 0430  PROCALCITON 2.19  --   --   --   --   --   WBC 7.5   < > 10.6* 10.7* 13.3* 12.2*   < > = values in this interval not displayed.    Microbiology Recent Results (from the past 240 hour(s))  Urine Culture     Status: Abnormal   Collection Time: 09/06/21 12:29 PM   Specimen: Urine, Clean Catch  Result Value Ref Range Status    Specimen Description URINE, CLEAN CATCH  Final   Special Requests NONE  Final   Culture (A)  Final    >=100,000 COLONIES/mL GROUP B STREP(S.AGALACTIAE)ISOLATED TESTING AGAINST S. AGALACTIAE NOT ROUTINELY PERFORMED DUE TO PREDICTABILITY OF AMP/PEN/VAN SUSCEPTIBILITY. Performed at [facility], [place] [address], [place], [place] [address]    Report Status 09/07/2021 FINAL  Final  Culture, blood (routine x 2)     Status: Abnormal   Collection Time: 09/06/21 12:34 PM   Specimen: BLOOD RIGHT HAND  Result Value Ref Range Status   Specimen Description BLOOD RIGHT HAND  Final   Special Requests   Final    BOTTLES DRAWN AEROBIC AND ANAEROBIC Blood Culture adequate volume   Culture  Setup Time   Final    GRAM POSITIVE COCCI IN CHAINS IN BOTH AEROBIC AND ANAEROBIC BOTTLES CRITICAL RESULT CALLED TO, READ BACK BY AND VERIFIED WITH: V BRYK,PHARMD@[id number]  09/07/21 [place] Performed at [facility], [place] [address] [place], [place] [address]    Culture STREPTOCOCCUS AGALACTIAE (A)  Final   Report Status 09/09/2021 FINAL  Final   Organism ID, Bacteria STREPTOCOCCUS AGALACTIAE  Final      Susceptibility   Streptococcus agalactiae - MIC*    CLINDAMYCIN >=1 RESISTANT Resistant     ERYTHROMYCIN >=8 RESISTANT Resistant     VANCOMYCIN 0.5 SENSITIVE Sensitive     CEFTRIAXONE <=0.12 SENSITIVE Sensitive     LEVOFLOXACIN 1 SENSITIVE Sensitive     PENICILLIN Value in next row Sensitive      SENSITIVE<=0.06    * STREPTOCOCCUS AGALACTIAE  Blood Culture ID Panel (Reflexed)     Status: Abnormal   Collection Time: 09/06/21 12:34 PM  Result Value Ref Range Status   Enterococcus faecalis NOT DETECTED NOT DETECTED Final   Enterococcus Faecium NOT DETECTED NOT DETECTED Final   Listeria monocytogenes NOT DETECTED NOT DETECTED Final   Staphylococcus species NOT DETECTED NOT DETECTED Final   Staphylococcus aureus (BCID) NOT DETECTED NOT DETECTED Final   Staphylococcus epidermidis NOT DETECTED NOT DETECTED  Final    Staphylococcus lugdunensis NOT DETECTED NOT DETECTED Final   Streptococcus species DETECTED (A) NOT DETECTED Final    Comment: CRITICAL RESULT CALLED TO, READ BACK BY AND VERIFIED WITH: V BRYK,PHARMD@[id number]  09/07/21 [place]    Streptococcus agalactiae DETECTED (A) NOT DETECTED Final    Comment: CRITICAL RESULT CALLED TO, READ BACK BY AND VERIFIED WITH: V BRYK,PHARMD@[id number]  09/07/21 [place]    Streptococcus pneumoniae NOT DETECTED NOT DETECTED Final   Streptococcus pyogenes NOT DETECTED NOT DETECTED Final   A.calcoaceticus-baumannii NOT DETECTED NOT DETECTED Final   Bacteroides fragilis NOT DETECTED NOT DETECTED Final   Enterobacterales NOT DETECTED NOT DETECTED Final   Enterobacter cloacae complex NOT DETECTED NOT DETECTED Final   Escherichia coli NOT DETECTED NOT DETECTED Final   Klebsiella aerogenes NOT DETECTED NOT DETECTED Final   Klebsiella oxytoca NOT DETECTED NOT DETECTED Final   Klebsiella pneumoniae NOT DETECTED NOT DETECTED Final   Proteus species NOT DETECTED NOT DETECTED Final   Salmonella species NOT DETECTED NOT DETECTED Final   Serratia marcescens NOT DETECTED NOT DETECTED Final   Haemophilus influenzae NOT DETECTED NOT DETECTED Final   Neisseria meningitidis NOT DETECTED NOT DETECTED Final   Pseudomonas aeruginosa NOT DETECTED NOT DETECTED Final   Stenotrophomonas maltophilia NOT DETECTED NOT DETECTED Final   Candida albicans NOT DETECTED NOT DETECTED Final   Candida auris NOT DETECTED NOT DETECTED Final   Candida glabrata NOT DETECTED NOT DETECTED Final   Candida krusei NOT DETECTED NOT DETECTED Final   Candida parapsilosis NOT DETECTED NOT DETECTED Final   Candida tropicalis NOT DETECTED NOT DETECTED Final   Cryptococcus neoformans/gattii NOT DETECTED NOT DETECTED Final    Comment: Performed at [facility], [address] [place], [place] [address]  Culture, blood (routine x 2)     Status: Abnormal   Collection Time: 09/06/21 12:46 PM   Specimen: BLOOD RIGHT HAND   Result Value Ref Range Status   Specimen Description BLOOD RIGHT HAND  Final   Special Requests   Final    BOTTLES DRAWN AEROBIC AND ANAEROBIC Blood Culture adequate volume   Culture  Setup Time   Final    GRAM POSITIVE COCCI IN CHAINS IN BOTH AEROBIC AND ANAEROBIC BOTTLES CRITICAL VALUE NOTED.  VALUE IS CONSISTENT WITH PREVIOUSLY REPORTED AND CALLED VALUE.    Culture (A)  Final    STREPTOCOCCUS AGALACTIAE SUSCEPTIBILITIES PERFORMED ON PREVIOUS CULTURE WITHIN THE LAST 5 DAYS. Performed at [facility], [place] [address] [place], [place] [address]    Report Status 09/09/2021 FINAL  Final  Culture, blood (Routine X 2) w Reflex to ID Panel     Status: None   Collection Time: 09/08/21  5:30 AM   Specimen: BLOOD LEFT HAND  Result Value Ref Range Status   Specimen Description BLOOD LEFT HAND  Final   Special Requests AEROBIC BOTTLE ONLY Blood Culture adequate volume  Final   Culture   Final    NO GROWTH 5 DAYS Performed at [facility], [place] [address], [place], [place] [address]    Report Status 09/13/2021 FINAL  Final  Culture, blood (routine x 2)     Status: None   Collection Time: 09/08/21  5:31 AM   Specimen: BLOOD RIGHT HAND  Result Value Ref Range Status   Specimen Description BLOOD RIGHT HAND  Final   Special Requests AEROBIC BOTTLE ONLY Blood Culture adequate volume  Final   Culture   Final    NO GROWTH 5 DAYS  Performed at [facility], [place] [address] [place], [place] [address]    Report Status 09/13/2021 FINAL  Final     Medications:    acyclovir  200 mg Oral Q12H   Chlorhexidine Gluconate Cloth  6 each Topical Daily   feeding supplement (NEPRO CARB STEADY)  237 mL Oral TID BM   folic acid  2 mg Oral Daily   Gerhardt's butt cream   Topical BID   melatonin  5 mg Oral QHS   nicotine  14 mg Transdermal Q24H   pantoprazole  40 mg Oral BID AC   sodium bicarbonate  1,300 mg Oral TID   tamsulosin  0.4 mg Oral Daily   Continuous Infusions:  sodium  chloride     sodium chloride 10 mL/hr at 09/13/21 1644   pencillin G potassium IV 4 Million Units (09/14/21 0437)   sodium chloride irrigation        LOS: 22 days   Charlynne Cousins  [facility]  09/14/2021, 8:00 AM

## 2021-09-14 NOTE — Plan of Care (Signed)

## 2021-09-14 NOTE — Progress Notes (Signed)
[facility] ROUNDING NOTE   Subjective:   Interval History: 70 year old nephrolithiasis generalized weakness presented with acute kidney injury.  Status post renal biopsy 08/28/2021.  Had postbiopsy bleeding into the renal collecting system with clot retention.  Demonstration of three-way catheter performed.  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  She is undergoing dialysis 09/07/2021.  We are dialyzing on a as needed basis.  We are coordinating treatment with his chemotherapy.  Last dialysis was 09/07/2021 with 2.4 L removed.  He is making good urine with 1.2 L 09/14/2021  Blood pressure 153/93 pulse 84 temperature 97.9 O2 sats 100% room air  Sodium 135 potassium 4.8 chloride 102 CO2 24 BUN 38 creatinine of 5.3 glucose 99 hemoglobin 9.3    Objective:  Vital signs in last 24 hours:  Temp:  [97.7 F (36.5 C)-97.9 F (36.6 C)] 97.9 F (36.6 C) (09/23 0642) Pulse Rate:  [84-92] 84 (09/23 0642) Resp:  [17-20] 18 (09/23 0642) BP: (106-153)/(79-93) 153/93 (09/23 0642) SpO2:  [100 %] 100 % (09/23 0642) Weight:  [72.7 kg] 72.7 kg (09/23 0710)  Weight change:  Filed Weights   09/12/21 0625 09/13/21 0500 09/14/21 0710  Weight: 72.7 kg 71.8 kg 72.7 kg    Intake/Output: I/O last 3 completed shifts: In: 1326.4 [P.O.:240; I.V.:586.4; IV Piggyback:500] Out: 4075 [Urine:4075]   Intake/Output this shift:  Total I/O In: -  Out: 200 [Urine:200]  General: nad Heart: rrr Lungs: normal wob, cta bl Abdomen: Soft, nontender Extremities: No LE edema. Neurology: Alert, awake, following commands, no asterixis Vascular Access: Right IJ temporary HD catheter in place.   Basic Metabolic Panel: Recent Labs  Lab 09/08/21 0530 09/09/21 0336 09/10/21 0229 09/11/21 0251 09/12/21 0217 09/13/21 0425 09/14/21 0430  NA 133*   < > 134* 131* 134* 135 135  K 3.3*   < > 4.1 4.1 4.6 4.8 4.8  CL 98   < > 98 98 101 101 102   CO2 23   < > 21* 22 22 23 24   GLUCOSE 105*   < > 118* 139* 96 96 99  BUN 32*   < > 41* 39* 39* 39* 38*  CREATININE 5.52*   < > 5.04* 4.90* 5.09* 5.09* 5.31*  CALCIUM 7.8*   < > 7.8* 7.5* 7.9* 8.1* 8.3*  MG 1.7  --   --   --   --   --   --   PHOS 3.9   < > 2.2* 3.3 3.6 4.5 5.0*   < > = values in this interval not displayed.     Liver Function Tests: Recent Labs  Lab 09/10/21 0229 09/11/21 0251 09/12/21 0217 09/13/21 0425 09/14/21 0430  ALBUMIN 1.6* 1.5* 1.6* 1.6* 1.6*    No results for input(s): LIPASE, AMYLASE in the last 168 hours. No results for input(s): AMMONIA in the last 168 hours.  CBC: Recent Labs  Lab 09/10/21 0229 09/11/21 0251 09/12/21 0217 09/13/21 0425 09/14/21 0430  WBC 13.1* 10.6* 10.7* 13.3* 12.2*  NEUTROABS 9.4* 7.0 6.5 8.8* 8.1*  HGB 9.3* 9.0* 9.6* 9.6* 9.3*  HCT 29.1* 28.5* 30.4* 30.1* 29.2*  MCV 84.3 85.8 84.9 84.3 85.1  PLT 409* 419* 473* 483* 434*     Cardiac Enzymes: No results for input(s): CKTOTAL, CKMB, CKMBINDEX, TROPONINI in the last 168 hours.  BNP: Invalid input(s): POCBNP  CBG: No results for input(s): GLUCAP in the last 168 hours.  Microbiology: Results for orders placed or performed  during the hospital encounter of 08/23/21  SARS CORONAVIRUS 2 (TAT 6-24 HRS) Nasopharyngeal Nasopharyngeal Swab     Status: None   Collection Time: 08/23/21  5:11 PM   Specimen: Nasopharyngeal Swab  Result Value Ref Range Status   SARS Coronavirus 2 NEGATIVE NEGATIVE Final    Comment: (NOTE) SARS-CoV-2 target nucleic acids are NOT DETECTED.  The SARS-CoV-2 RNA is generally detectable in upper and lower respiratory specimens during the acute phase of infection. Negative results do not preclude SARS-CoV-2 infection, do not rule out co-infections with other pathogens, and should not be used as the sole basis for treatment or other patient management decisions. Negative results must be combined with clinical observations, patient history,  and epidemiological information. The expected result is Negative.  Fact Sheet for Patients: SugarRoll.be  Fact Sheet for Healthcare Providers: https://www.woods-mathews.com/  This test is not yet approved or cleared by the [place] FDA and  has been authorized for detection and/or diagnosis of SARS-CoV-2 by FDA under an Emergency Use Authorization (EUA). This EUA will remain  in effect (meaning this test can be used) for the duration of the COVID-19 declaration under Se ction 564(b)(1) of the Act, 21 U.S.C. section 360bbb-3(b)(1), unless the authorization is terminated or revoked sooner.  Performed at [facility], [place] [address] [place], [place] [address]   Urine Culture     Status: Abnormal   Collection Time: 09/06/21 12:29 PM   Specimen: Urine, Clean Catch  Result Value Ref Range Status   Specimen Description URINE, CLEAN CATCH  Final   Special Requests NONE  Final   Culture (A)  Final    >=100,000 COLONIES/mL GROUP B STREP(S.AGALACTIAE)ISOLATED TESTING AGAINST S. AGALACTIAE NOT ROUTINELY PERFORMED DUE TO PREDICTABILITY OF AMP/PEN/VAN SUSCEPTIBILITY. Performed at [facility], [place] [address] [place], [place] [address]    Report Status 09/07/2021 FINAL  Final  Culture, blood (routine x 2)     Status: Abnormal   Collection Time: 09/06/21 12:34 PM   Specimen: BLOOD RIGHT HAND  Result Value Ref Range Status   Specimen Description BLOOD RIGHT HAND  Final   Special Requests   Final    BOTTLES DRAWN AEROBIC AND ANAEROBIC Blood Culture adequate volume   Culture  Setup Time   Final    GRAM POSITIVE COCCI IN CHAINS IN BOTH AEROBIC AND ANAEROBIC BOTTLES CRITICAL RESULT CALLED TO, READ BACK BY AND VERIFIED WITH: V BRYK,PHARMD@[id number]  09/07/21 [place] Performed at [facility], [place] [address] [place], [place] [address]    Culture STREPTOCOCCUS AGALACTIAE (A)  Final   Report Status 09/09/2021 FINAL  Final   Organism ID,  Bacteria STREPTOCOCCUS AGALACTIAE  Final      Susceptibility   Streptococcus agalactiae - MIC*    CLINDAMYCIN >=1 RESISTANT Resistant     ERYTHROMYCIN >=8 RESISTANT Resistant     VANCOMYCIN 0.5 SENSITIVE Sensitive     CEFTRIAXONE <=0.12 SENSITIVE Sensitive     LEVOFLOXACIN 1 SENSITIVE Sensitive     PENICILLIN Value in next row Sensitive      SENSITIVE<=0.06    * STREPTOCOCCUS AGALACTIAE  Blood Culture ID Panel (Reflexed)     Status: Abnormal   Collection Time: 09/06/21 12:34 PM  Result Value Ref Range Status   Enterococcus faecalis NOT DETECTED NOT DETECTED Final   Enterococcus Faecium NOT DETECTED NOT DETECTED Final   Listeria monocytogenes NOT DETECTED NOT DETECTED Final   Staphylococcus species NOT DETECTED NOT DETECTED Final   Staphylococcus aureus (BCID) NOT DETECTED  NOT DETECTED Final   Staphylococcus epidermidis NOT DETECTED NOT DETECTED Final   Staphylococcus lugdunensis NOT DETECTED NOT DETECTED Final   Streptococcus species DETECTED (A) NOT DETECTED Final    Comment: CRITICAL RESULT CALLED TO, READ BACK BY AND VERIFIED WITH: V BRYK,PHARMD@[id number]  09/07/21 [place]    Streptococcus agalactiae DETECTED (A) NOT DETECTED Final    Comment: CRITICAL RESULT CALLED TO, READ BACK BY AND VERIFIED WITH: V BRYK,PHARMD@[id number]  09/07/21 [place]    Streptococcus pneumoniae NOT DETECTED NOT DETECTED Final   Streptococcus pyogenes NOT DETECTED NOT DETECTED Final   A.calcoaceticus-baumannii NOT DETECTED NOT DETECTED Final   Bacteroides fragilis NOT DETECTED NOT DETECTED Final   Enterobacterales NOT DETECTED NOT DETECTED Final   Enterobacter cloacae complex NOT DETECTED NOT DETECTED Final   Escherichia coli NOT DETECTED NOT DETECTED Final   Klebsiella aerogenes NOT DETECTED NOT DETECTED Final   Klebsiella oxytoca NOT DETECTED NOT DETECTED Final   Klebsiella pneumoniae NOT DETECTED NOT DETECTED Final   Proteus species NOT DETECTED NOT DETECTED Final   Salmonella species NOT DETECTED NOT DETECTED Final    Serratia marcescens NOT DETECTED NOT DETECTED Final   Haemophilus influenzae NOT DETECTED NOT DETECTED Final   Neisseria meningitidis NOT DETECTED NOT DETECTED Final   Pseudomonas aeruginosa NOT DETECTED NOT DETECTED Final   Stenotrophomonas maltophilia NOT DETECTED NOT DETECTED Final   Candida albicans NOT DETECTED NOT DETECTED Final   Candida auris NOT DETECTED NOT DETECTED Final   Candida glabrata NOT DETECTED NOT DETECTED Final   Candida krusei NOT DETECTED NOT DETECTED Final   Candida parapsilosis NOT DETECTED NOT DETECTED Final   Candida tropicalis NOT DETECTED NOT DETECTED Final   Cryptococcus neoformans/gattii NOT DETECTED NOT DETECTED Final    Comment: Performed at [facility], [address] [place], [place] [address]  Culture, blood (routine x 2)     Status: Abnormal   Collection Time: 09/06/21 12:46 PM   Specimen: BLOOD RIGHT HAND  Result Value Ref Range Status   Specimen Description BLOOD RIGHT HAND  Final   Special Requests   Final    BOTTLES DRAWN AEROBIC AND ANAEROBIC Blood Culture adequate volume   Culture  Setup Time   Final    GRAM POSITIVE COCCI IN CHAINS IN BOTH AEROBIC AND ANAEROBIC BOTTLES CRITICAL VALUE NOTED.  VALUE IS CONSISTENT WITH PREVIOUSLY REPORTED AND CALLED VALUE.    Culture (A)  Final    STREPTOCOCCUS AGALACTIAE SUSCEPTIBILITIES PERFORMED ON PREVIOUS CULTURE WITHIN THE LAST 5 DAYS. Performed at [facility], [place] [address], [place], [place] [address]    Report Status 09/09/2021 FINAL  Final  Culture, blood (Routine X 2) w Reflex to ID Panel     Status: None   Collection Time: 09/08/21  5:30 AM   Specimen: BLOOD LEFT HAND  Result Value Ref Range Status   Specimen Description BLOOD LEFT HAND  Final   Special Requests AEROBIC BOTTLE ONLY Blood Culture adequate volume  Final   Culture   Final    NO GROWTH 5 DAYS Performed at [facility], [place] [address] [place], [place] [address]    Report Status 09/13/2021 FINAL  Final   Culture, blood (routine x 2)     Status: None   Collection Time: 09/08/21  5:31 AM   Specimen: BLOOD RIGHT HAND  Result Value Ref Range Status   Specimen Description BLOOD RIGHT HAND  Final   Special Requests AEROBIC BOTTLE ONLY Blood Culture adequate volume  Final  Culture   Final    NO GROWTH 5 DAYS Performed at [facility], [place] [address], [place], [place] [address]    Report Status 09/13/2021 FINAL  Final    Coagulation Studies: No results for input(s): LABPROT, INR in the last 72 hours.  Urinalysis: No results for input(s): COLORURINE, LABSPEC, PHURINE, GLUCOSEU, HGBUR, BILIRUBINUR, KETONESUR, PROTEINUR, UROBILINOGEN, NITRITE, LEUKOCYTESUR in the last 72 hours.  Invalid input(s): APPERANCEUR    Imaging: No results found.   Medications:    sodium chloride     sodium chloride 10 mL/hr at 09/13/21 1644   pencillin G potassium IV 4 Million Units (09/14/21 0437)   sodium chloride irrigation      acyclovir  200 mg Oral Q12H   Chlorhexidine Gluconate Cloth  6 each Topical Daily   feeding supplement (NEPRO CARB STEADY)  237 mL Oral TID BM   folic acid  2 mg Oral Daily   Gerhardt's butt cream   Topical BID   melatonin  5 mg Oral QHS   nicotine  14 mg Transdermal Q24H   pantoprazole  40 mg Oral BID AC   sodium bicarbonate  1,300 mg Oral TID   tamsulosin  0.4 mg Oral Daily   acetaminophen, ALPRAZolam, bisacodyl, diphenhydrAMINE, lidocaine, ondansetron **OR** ondansetron (ZOFRAN) IV, oxyCODONE, prochlorperazine, promethazine, technetium albumin aggregated, traMADol, traZODone  Assessment/ Plan:   #Acute kidney injury, nonoliguric, with history of excessive NSAID's use/BC powders every day.  Peaked creatinine level of 6.95. [name]  He underwent IR guided kidney biopsy on 9/6 complicated by postbiopsy bleeding.  Renal biopsy: AL amyloidosis, lambda light chain composition (glomeruli, interstitium, arteries, and arterioles). 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   -He had right IJ temporary HD catheter placed on 9/8 in IR during embolization of the bleeding artery.   -Suspected uremia given persistent lack of appetite, dysgeusia, hiccups therefore proceeded with HD on 9/16 (slow start protocol).  He has had no further dialysis treatments although his creatinine does not appear to be improving it is stable.  Continue to avoid nephrotoxins.  There are no absolute indications for dialysis.  However renal function continues to remain poor.   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  Per primary service    #Pulm edema appears to have resolved with good diuresis    #Anemia, appears to be relatively stable with a hemoglobin is unchanged   #History of nephrolithiasis: Followed by urology   #Metabolic acidosis: Appears to have resolved continue oral sodium bicarbonate at this point  Remove temporary dialysis catheter at this point.  He can follow-up with [facility] as an outpatient.   LOS: [place] @TODAY @7 :42 AM

## 2021-09-15 DIAGNOSIS — B951 Streptococcus, group B, as the cause of diseases classified elsewhere: Secondary | ICD-10-CM | POA: Diagnosis not present

## 2021-09-15 DIAGNOSIS — E8581 Light chain (AL) amyloidosis: Secondary | ICD-10-CM | POA: Diagnosis not present

## 2021-09-15 DIAGNOSIS — R7881 Bacteremia: Secondary | ICD-10-CM | POA: Diagnosis not present

## 2021-09-15 LAB — CBC WITH DIFFERENTIAL/PLATELET
Abs Immature Granulocytes: 0.18 10*3/uL — ABNORMAL HIGH (ref 0.00–0.07)
Basophils Absolute: 0.1 10*3/uL (ref 0.0–0.1)
Basophils Relative: 1 %
Eosinophils Absolute: 0.3 10*3/uL (ref 0.0–0.5)
Eosinophils Relative: 3 %
HCT: 28.4 % — ABNORMAL LOW (ref 39.0–52.0)
Hemoglobin: 9.2 g/dL — ABNORMAL LOW (ref 13.0–17.0)
Immature Granulocytes: 2 %
Lymphocytes Relative: 24 %
Lymphs Abs: 2.8 10*3/uL (ref 0.7–4.0)
MCH: 27.1 pg (ref 26.0–34.0)
MCHC: 32.4 g/dL (ref 30.0–36.0)
MCV: 83.5 fL (ref 80.0–100.0)
Monocytes Absolute: 0.9 10*3/uL (ref 0.1–1.0)
Monocytes Relative: 8 %
Neutro Abs: 7.2 10*3/uL (ref 1.7–7.7)
Neutrophils Relative %: 62 %
Platelets: 437 10*3/uL — ABNORMAL HIGH (ref 150–400)
RBC: 3.4 MIL/uL — ABNORMAL LOW (ref 4.22–5.81)
RDW: 18.3 % — ABNORMAL HIGH (ref 11.5–15.5)
WBC: 11.5 10*3/uL — ABNORMAL HIGH (ref 4.0–10.5)
nRBC: 0 % (ref 0.0–0.2)

## 2021-09-15 LAB — RENAL FUNCTION PANEL
Albumin: 1.6 g/dL — ABNORMAL LOW (ref 3.5–5.0)
Anion gap: 10 (ref 5–15)
BUN: 39 mg/dL — ABNORMAL HIGH (ref 8–23)
CO2: 22 mmol/L (ref 22–32)
Calcium: 8.1 mg/dL — ABNORMAL LOW (ref 8.9–10.3)
Chloride: 100 mmol/L (ref 98–111)
Creatinine, Ser: 5.37 mg/dL — ABNORMAL HIGH (ref 0.61–1.24)
GFR, Estimated: 11 mL/min — ABNORMAL LOW (ref >60)
Glucose, Bld: 95 mg/dL (ref 70–99)
Phosphorus: 5.3 mg/dL — ABNORMAL HIGH (ref 2.5–4.6)
Potassium: 4.8 mmol/L (ref 3.5–5.1)
Sodium: 132 mmol/L — ABNORMAL LOW (ref 135–145)

## 2021-09-15 NOTE — Plan of Care (Signed)
  Problem: Fluid Volume: Goal: Compliance with measures to maintain balanced fluid volume will improve Outcome: Progressing   Problem: Education: Goal: Knowledge of General Education information will improve Description: Including pain rating scale, medication(s)/side effects and non-pharmacologic comfort measures Outcome: Progressing   Problem: Clinical Measurements: Goal: Complications related to the disease process, condition or treatment will be avoided or minimized Outcome: Progressing   Problem: Clinical Measurements: Goal: Ability to maintain clinical measurements within normal limits will improve Outcome: Progressing   Problem: Clinical Measurements: Goal: Diagnostic test results will improve Outcome: Progressing   Problem: Clinical Measurements: Goal: Respiratory complications will improve Outcome: Progressing   Problem: Clinical Measurements: Goal: Cardiovascular complication will be avoided Outcome: Progressing   Problem: Coping: Goal: Level of anxiety will decrease Outcome: Progressing   Problem: Nutrition: Goal: Adequate nutrition will be maintained Outcome: Progressing   Problem: Pain Managment: Goal: General experience of comfort will improve Outcome: Progressing   Problem: Elimination: Goal: Will not experience complications related to bowel motility Outcome: Progressing   Problem: Safety: Goal: Ability to remain free from injury will improve Outcome: Progressing   Problem: Skin Integrity: Goal: Risk for impaired skin integrity will decrease Outcome: Progressing

## 2021-09-15 NOTE — Progress Notes (Signed)
[facility] PROGRESS NOTE    Progress Note  Caleb Simmons  QJJ:[id number] DOB: 10-07-1951 DOA: 08/23/2021 PCP: Pcp, No     Brief Narrative:   Caleb Simmons is an 70 y.o. male past medical history of nephrolithiasis with no other medical problems, but he does not see a primary care doctor regularly comes into the ED complaining of weakness was found to be in acute kidney injury and severe normocytic anemia.  Seen by nephrology who recommended renal biopsy done on 08/28/2021 unfortunately developed post renal bleeding with clots requiring an insertion of a CBI he is status post 2 units of packed red blood cells but he continued to bleed so IR performed embolization on 08/30/2021, renal biopsy showed amyloidosis.  Oncology has been consulted.  Also found to have strep agalactiae bacteremia.  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 The plan is to continue to administer weekly chemotherapy next 1 due on 09/17/2021. Oncology recommended acyclovir.  AKI (acute kidney injury) ([place]) secondary to amyloid disease: Despite good urine output, renal function continues to remain poor. Last HD treatment was on 09/07/2021.  Patient had a right IJ HD catheter placed on 08/30/2021 He is status post biopsied on 08/28/2021 . Oncology has also been consulted for plasma cell myeloma and started on  chemotherapy treatment on 09/10/2021 Continue further management per renal. Significantly nauseated this morning likely due to uremia.  Sepsis secondary to strep recollected bacteremia: 2D echo was done that showed no vegetation, but it did show a small drop in his EF. Continue diuresis penicillin and Rocephin ID was consulted who recommended treatment for total 10 days, last day on 09/16/2021.  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 The advanced heart failure team was consulted they recommended a cardiac MRI on 09/10/2021 was suggestive of cardiac amyloid, Cardiology recommended chemotherapy, oncology is on board. No diuretics needed at this time.  Cardiology has signed off.  Gross hematuria/acute urinary retention due to bleeding in the collecting duct of the right kidney post renal biopsy: Foley was discontinued 08/30/2021. He required multiple transfusions. Status post embolization on 08/30/2021. Further management per urology.  Anxiety: Continue Xanax aware that we will not continue Xanax postdischarge, continue trazodone. He is not interested in other medications like SSRI.  Tobacco abuse: Continue nicotine patch.   DVT prophylaxis: lovenxo Family Communication:noen Status is: Inpatient  Remains inpatient appropriate because:Hemodynamically unstable  Dispo: The patient is from: Home              Anticipated d/c is to: Home              Patient currently is not medically stable to d/c.   Difficult to place patient No   Code Status:     Code Status Orders  (From admission, onward)  Start     Ordered   08/23/21 1752  Full code  Continuous        08/23/21 1751           Code Status History     This patient has a current code status but no historical code status.         IV Access:    Peripheral IV   Procedures and diagnostic studies:   No results found.   Medical Consultants:   None.   Subjective:    Hyman Hopes Windt nausea is improved.  Objective:    Vitals:   09/14/21 0710 09/14/21 1211 09/14/21 2119 09/15/21 0423  BP:  126/78 (!) 142/91 112/68  Pulse:  77 77 68  Resp:  18 18 16   Temp:  97.7 F (36.5 C) 97.6 F (36.4 C) 97.6 F (36.4 C)  TempSrc:  Oral Axillary Oral  SpO2:  100% 100% 100%  Weight: 72.7 kg   72 kg  Height:       SpO2: 100 % O2 Flow Rate (L/min): 2 L/min   Intake/Output Summary (Last 24 hours) at 09/15/2021 0914 Last data filed at 09/15/2021 0224 Gross per 24 hour  Intake --  Output 1615 ml  Net -1615 ml    Filed Weights   09/13/21 0500 09/14/21 0710 09/15/21 0423  Weight: 71.8 kg 72.7 kg 72 kg    Exam: General exam: In no acute distress. Respiratory system: Good air movement and clear to auscultation. Cardiovascular system: S1 & S2 heard, RRR. No JVD. Gastrointestinal system: Abdomen is nondistended, soft and nontender.  Extremities: No pedal edema. Skin: No rashes, lesions or ulcers Psychiatry: Judgement and insight appear normal. Mood & affect appropriate.  Data Reviewed:    Labs: Basic Metabolic Panel: Recent Labs  Lab 09/11/21 0251 09/12/21 0217 09/13/21 0425 09/14/21 0430 09/15/21 0144  NA 131* 134* 135 135 132*  K 4.1 4.6 4.8 4.8 4.8  CL 98 101 101 102 100  CO2 22 22 23 24 22   GLUCOSE 139* 96 96 99 95  BUN 39* 39* 39* 38* 39*  CREATININE 4.90* 5.09* 5.09* 5.31* 5.37*  CALCIUM 7.5* 7.9* 8.1* 8.3* 8.1*  PHOS 3.3 3.6 4.5 5.0* 5.3*    GFR Estimated Creatinine Clearance: 12.8 mL/min (A) (by C-G formula based on SCr of 5.37 mg/dL (H)). Liver Function Tests: Recent Labs  Lab 09/11/21 0251 09/12/21 0217 09/13/21 0425 09/14/21 0430 09/15/21 0144  ALBUMIN 1.5* 1.6* 1.6* 1.6* 1.6*    No results for input(s): LIPASE, AMYLASE in the last 168 hours. No results for input(s): AMMONIA in the  last 168 hours. Coagulation profile No results for input(s): INR, PROTIME in the last 168 hours. COVID-19 Labs  No results for input(s): DDIMER, FERRITIN, LDH, CRP in the last 72 hours.  Lab Results  Component Value Date   [place] NEGATIVE 08/23/2021    CBC: Recent Labs  Lab 09/11/21 0251 09/12/21 0217 09/13/21 0425 09/14/21 0430 09/15/21 0144  WBC 10.6* 10.7* 13.3* 12.2* 11.5*  NEUTROABS 7.0 6.5 8.8* 8.1* 7.2  HGB 9.0* 9.6* 9.6* 9.3* 9.2*  HCT 28.5* 30.4* 30.1* 29.2* 28.4*  MCV 85.8 84.9 84.3 85.1 83.5  PLT 419* 473* 483* 434* 437*    Cardiac Enzymes: No results for input(s): CKTOTAL, CKMB, CKMBINDEX, TROPONINI in the last 168 hours. BNP (last 3 results) No results for input(s): PROBNP in the last 8760 hours. CBG: No results for input(s): GLUCAP in the last 168 hours. D-Dimer: No results for input(s): DDIMER  in the last 72 hours. Hgb A1c: No results for input(s): HGBA1C in the last 72 hours. Lipid Profile: No results for input(s): CHOL, HDL, LDLCALC, TRIG, CHOLHDL, LDLDIRECT in the last 72 hours. Thyroid function studies: No results for input(s): TSH, T4TOTAL, T3FREE, THYROIDAB in the last 72 hours.  Invalid input(s): FREET3 Anemia work up: No results for input(s): VITAMINB12, FOLATE, FERRITIN, TIBC, IRON, RETICCTPCT in the last 72 hours. Sepsis Labs: Recent Labs  Lab 09/12/21 0217 09/13/21 0425 09/14/21 0430 09/15/21 0144  WBC 10.7* 13.3* 12.2* 11.5*    Microbiology Recent Results (from the past 240 hour(s))  Urine Culture     Status: Abnormal   Collection Time: 09/06/21 12:29 PM   Specimen: Urine, Clean Catch  Result Value Ref Range Status   Specimen Description URINE, CLEAN CATCH  Final   Special Requests NONE  Final   Culture (A)  Final    >=100,000 COLONIES/mL GROUP B STREP(S.AGALACTIAE)ISOLATED TESTING AGAINST S. AGALACTIAE NOT ROUTINELY PERFORMED DUE TO PREDICTABILITY OF AMP/PEN/VAN SUSCEPTIBILITY. Performed at [facility],  [place] [address], [place], [place] [address]    Report Status 09/07/2021 FINAL  Final  Culture, blood (routine x 2)     Status: Abnormal   Collection Time: 09/06/21 12:34 PM   Specimen: BLOOD RIGHT HAND  Result Value Ref Range Status   Specimen Description BLOOD RIGHT HAND  Final   Special Requests   Final    BOTTLES DRAWN AEROBIC AND ANAEROBIC Blood Culture adequate volume   Culture  Setup Time   Final    GRAM POSITIVE COCCI IN CHAINS IN BOTH AEROBIC AND ANAEROBIC BOTTLES CRITICAL RESULT CALLED TO, READ BACK BY AND VERIFIED WITH: V BRYK,[email or web address]@[id number]  09/07/21 [place] Performed at [facility], [place] [address], [place], [place] [address]    Culture STREPTOCOCCUS AGALACTIAE (A)  Final   Report Status 09/09/2021 FINAL  Final   Organism ID, Bacteria STREPTOCOCCUS AGALACTIAE  Final      Susceptibility   Streptococcus agalactiae - MIC*    CLINDAMYCIN >=1 RESISTANT Resistant     ERYTHROMYCIN >=8 RESISTANT Resistant     VANCOMYCIN 0.5 SENSITIVE Sensitive     CEFTRIAXONE <=0.12 SENSITIVE Sensitive     LEVOFLOXACIN 1 SENSITIVE Sensitive     PENICILLIN Value in next row Sensitive      SENSITIVE<=0.06    * STREPTOCOCCUS AGALACTIAE  Blood Culture ID Panel (Reflexed)     Status: Abnormal   Collection Time: 09/06/21 12:34 PM  Result Value Ref Range Status   Enterococcus faecalis NOT DETECTED NOT DETECTED Final   Enterococcus Faecium NOT DETECTED NOT DETECTED Final   Listeria monocytogenes NOT DETECTED NOT DETECTED Final   Staphylococcus species NOT DETECTED NOT DETECTED Final   Staphylococcus aureus (BCID) NOT DETECTED NOT DETECTED Final   Staphylococcus epidermidis NOT DETECTED NOT DETECTED Final   Staphylococcus lugdunensis NOT DETECTED NOT DETECTED Final   Streptococcus species DETECTED (A) NOT DETECTED Final    Comment: CRITICAL RESULT CALLED TO, READ BACK BY AND VERIFIED WITH: V BRYK,PHARMD@[id number]  09/07/21 [place]    Streptococcus agalactiae DETECTED (A) NOT DETECTED Final    Comment: CRITICAL  RESULT CALLED TO, READ BACK BY AND VERIFIED WITH: V BRYK,PHARMD@[id number]  09/07/21 [place]    Streptococcus pneumoniae NOT DETECTED NOT DETECTED Final   Streptococcus pyogenes NOT DETECTED NOT DETECTED Final   A.calcoaceticus-baumannii NOT DETECTED NOT DETECTED Final   Bacteroides fragilis NOT DETECTED NOT DETECTED Final   Enterobacterales NOT DETECTED NOT DETECTED Final   Enterobacter cloacae complex NOT  DETECTED NOT DETECTED Final   Escherichia coli NOT DETECTED NOT DETECTED Final   Klebsiella aerogenes NOT DETECTED NOT DETECTED Final   Klebsiella oxytoca NOT DETECTED NOT DETECTED Final   Klebsiella pneumoniae NOT DETECTED NOT DETECTED Final   Proteus species NOT DETECTED NOT DETECTED Final   Salmonella species NOT DETECTED NOT DETECTED Final   Serratia marcescens NOT DETECTED NOT DETECTED Final   Haemophilus influenzae NOT DETECTED NOT DETECTED Final   Neisseria meningitidis NOT DETECTED NOT DETECTED Final   Pseudomonas aeruginosa NOT DETECTED NOT DETECTED Final   Stenotrophomonas maltophilia NOT DETECTED NOT DETECTED Final   Candida albicans NOT DETECTED NOT DETECTED Final   Candida auris NOT DETECTED NOT DETECTED Final   Candida glabrata NOT DETECTED NOT DETECTED Final   Candida krusei NOT DETECTED NOT DETECTED Final   Candida parapsilosis NOT DETECTED NOT DETECTED Final   Candida tropicalis NOT DETECTED NOT DETECTED Final   Cryptococcus neoformans/gattii NOT DETECTED NOT DETECTED Final    Comment: Performed at [facility], [place] [address], [place], [place] [address]  Culture, blood (routine x 2)     Status: Abnormal   Collection Time: 09/06/21 12:46 PM   Specimen: BLOOD RIGHT HAND  Result Value Ref Range Status   Specimen Description BLOOD RIGHT HAND  Final   Special Requests   Final    BOTTLES DRAWN AEROBIC AND ANAEROBIC Blood Culture adequate volume   Culture  Setup Time   Final    GRAM POSITIVE COCCI IN CHAINS IN BOTH AEROBIC AND ANAEROBIC BOTTLES CRITICAL VALUE NOTED.   VALUE IS CONSISTENT WITH PREVIOUSLY REPORTED AND CALLED VALUE.    Culture (A)  Final    STREPTOCOCCUS AGALACTIAE SUSCEPTIBILITIES PERFORMED ON PREVIOUS CULTURE WITHIN THE LAST 5 DAYS. Performed at [facility], [place] [address] [place], [place] [address]    Report Status 09/09/2021 FINAL  Final  Culture, blood (Routine X 2) w Reflex to ID Panel     Status: None   Collection Time: 09/08/21  5:30 AM   Specimen: BLOOD LEFT HAND  Result Value Ref Range Status   Specimen Description BLOOD LEFT HAND  Final   Special Requests AEROBIC BOTTLE ONLY Blood Culture adequate volume  Final   Culture   Final    NO GROWTH 5 DAYS Performed at [facility], [place] [address] [place], [place] [address]    Report Status 09/13/2021 FINAL  Final  Culture, blood (routine x 2)     Status: None   Collection Time: 09/08/21  5:31 AM   Specimen: BLOOD RIGHT HAND  Result Value Ref Range Status   Specimen Description BLOOD RIGHT HAND  Final   Special Requests AEROBIC BOTTLE ONLY Blood Culture adequate volume  Final   Culture   Final    NO GROWTH 5 DAYS Performed at [facility], [place] [address] [place],  [address]    Report Status 09/13/2021 FINAL  Final     Medications:    acyclovir  200 mg Oral Q12H   Chlorhexidine Gluconate Cloth  6 each Topical Daily   feeding supplement (NEPRO CARB STEADY)  237 mL Oral TID BM   folic acid  2 mg Oral Daily   Gerhardt's butt cream   Topical BID   melatonin  5 mg Oral QHS   nicotine  14 mg Transdermal Q24H   pantoprazole  40 mg Oral BID AC   sodium bicarbonate  1,300 mg Oral TID   tamsulosin  0.4  mg Oral Daily   Continuous Infusions:  sodium chloride     sodium chloride 10 mL/hr at 09/13/21 1644   pencillin G potassium IV 4 Million Units (09/15/21 0420)   sodium chloride irrigation        LOS: 23 days   Charlynne Cousins  [facility]  09/15/2021, 9:14 AM

## 2021-09-15 NOTE — Progress Notes (Signed)
[facility] ROUNDING NOTE   Subjective:   Interval History: 70 year old nephrolithiasis generalized weakness presented with acute kidney injury.  Status post renal biopsy 08/28/2021.  Had postbiopsy bleeding into the renal collecting system with clot retention.  Demonstration of three-way catheter performed.  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  She is undergoing dialysis 09/07/2021.  We are dialyzing on a as needed basis.  We are coordinating treatment with his chemotherapy.  Last dialysis was 09/07/2021 with 2.4 L removed.  He is making good urine with 2.4 L 09/14/2021  Blood pressure 112/68 pulse 76 temperature 97.6 O2 sats 100% room air  Sodium 132 potassium 4.8 chloride 100 CO2 22 BUN 35 creatinine 5.39 glucose 95 calcium 8.1 phosphorus 5.3 albumin 1.6 hemoglobin 9.2    Objective:  Vital signs in last 24 hours:  Temp:  [97.6 F (36.4 C)-97.7 F (36.5 C)] 97.6 F (36.4 C) (09/24 0423) Pulse Rate:  [68-77] 68 (09/24 0423) Resp:  [16-18] 16 (09/24 0423) BP: (112-142)/(68-91) 112/68 (09/24 0423) SpO2:  [100 %] 100 % (09/24 0423) Weight:  [72 kg] 72 kg (09/24 0423)  Weight change:  Filed Weights   09/13/21 0500 09/14/21 0710 09/15/21 0423  Weight: 71.8 kg 72.7 kg 72 kg    Intake/Output: I/O last 3 completed shifts: In: 579.6 [P.O.:240; I.V.:89.6; IV Piggyback:250] Out: 3762 [Urine:3190]   Intake/Output this shift:  No intake/output data recorded.  General: nad Heart: rrr Lungs: normal wob, cta bl Abdomen: Soft, nontender Extremities: No LE edema. Neurology: Alert, awake, following commands, no asterixis Vascular Access: Right IJ temporary HD catheter in place.   Basic Metabolic Panel: Recent Labs  Lab 09/11/21 0251 09/12/21 0217 09/13/21 0425 09/14/21 0430 09/15/21 0144  NA 131* 134* 135 135 132*  K 4.1 4.6 4.8 4.8 4.8  CL 98 101 101 102 100  CO2 22 22 23 24 22   GLUCOSE 139* 96  96 99 95  BUN 39* 39* 39* 38* 39*  CREATININE 4.90* 5.09* 5.09* 5.31* 5.37*  CALCIUM 7.5* 7.9* 8.1* 8.3* 8.1*  PHOS 3.3 3.6 4.5 5.0* 5.3*     Liver Function Tests: Recent Labs  Lab 09/11/21 0251 09/12/21 0217 09/13/21 0425 09/14/21 0430 09/15/21 0144  ALBUMIN 1.5* 1.6* 1.6* 1.6* 1.6*    No results for input(s): LIPASE, AMYLASE in the last 168 hours. No results for input(s): AMMONIA in the last 168 hours.  CBC: Recent Labs  Lab 09/11/21 0251 09/12/21 0217 09/13/21 0425 09/14/21 0430 09/15/21 0144  WBC 10.6* 10.7* 13.3* 12.2* 11.5*  NEUTROABS 7.0 6.5 8.8* 8.1* 7.2  HGB 9.0* 9.6* 9.6* 9.3* 9.2*  HCT 28.5* 30.4* 30.1* 29.2* 28.4*  MCV 85.8 84.9 84.3 85.1 83.5  PLT 419* 473* 483* 434* 437*     Cardiac Enzymes: No results for input(s): CKTOTAL, CKMB, CKMBINDEX, TROPONINI in the last 168 hours.  BNP: Invalid input(s): POCBNP  CBG: No results for input(s): GLUCAP in the last 168 hours.  Microbiology: Results for orders placed or performed during the hospital encounter of 08/23/21  SARS CORONAVIRUS 2 (TAT 6-24 HRS) Nasopharyngeal Nasopharyngeal Swab     Status: None   Collection Time: 08/23/21  5:11 PM   Specimen: Nasopharyngeal Swab  Result Value Ref Range Status   SARS Coronavirus 2 NEGATIVE NEGATIVE Final    Comment: (NOTE) SARS-CoV-2 target nucleic acids are NOT DETECTED.  The SARS-CoV-2 RNA is generally detectable in upper and lower respiratory specimens during the acute phase of infection.  Negative results do not preclude SARS-CoV-2 infection, do not rule out co-infections with other pathogens, and should not be used as the sole basis for treatment or other patient management decisions. Negative results must be combined with clinical observations, patient history, and epidemiological information. The expected result is Negative.  Fact Sheet for Patients: SugarRoll.be  Fact Sheet for Healthcare  Providers: https://www.woods-mathews.com/  This test is not yet approved or cleared by the [place] FDA and  has been authorized for detection and/or diagnosis of SARS-CoV-2 by FDA under an Emergency Use Authorization (EUA). This EUA will remain  in effect (meaning this test can be used) for the duration of the COVID-19 declaration under Se ction 564(b)(1) of the Act, 21 U.S.C. section 360bbb-3(b)(1), unless the authorization is terminated or revoked sooner.  Performed at [facility], [place] [address] [place], [place] [address]   Urine Culture     Status: Abnormal   Collection Time: 09/06/21 12:29 PM   Specimen: Urine, Clean Catch  Result Value Ref Range Status   Specimen Description URINE, CLEAN CATCH  Final   Special Requests NONE  Final   Culture (A)  Final    >=100,000 COLONIES/mL GROUP B STREP(S.AGALACTIAE)ISOLATED TESTING AGAINST S. AGALACTIAE NOT ROUTINELY PERFORMED DUE TO PREDICTABILITY OF AMP/PEN/VAN SUSCEPTIBILITY. Performed at [facility], [place] [address] [place], [place] [address]    Report Status 09/07/2021 FINAL  Final  Culture, blood (routine x 2)     Status: Abnormal   Collection Time: 09/06/21 12:34 PM   Specimen: BLOOD RIGHT HAND  Result Value Ref Range Status   Specimen Description BLOOD RIGHT HAND  Final   Special Requests   Final    BOTTLES DRAWN AEROBIC AND ANAEROBIC Blood Culture adequate volume   Culture  Setup Time   Final    GRAM POSITIVE COCCI IN CHAINS IN BOTH AEROBIC AND ANAEROBIC BOTTLES CRITICAL RESULT CALLED TO, READ BACK BY AND VERIFIED WITH: V BRYK,PHARMD@[id number]  09/07/21 [place] Performed at [facility], [place] [address] [place] [address], [place],  [address]    Culture STREPTOCOCCUS AGALACTIAE (A)  Final   Report Status 09/09/2021 FINAL  Final   Organism ID, Bacteria STREPTOCOCCUS AGALACTIAE  Final      Susceptibility   Streptococcus agalactiae - MIC*    CLINDAMYCIN >=1 RESISTANT Resistant     ERYTHROMYCIN >=8 RESISTANT  Resistant     VANCOMYCIN 0.5 SENSITIVE Sensitive     CEFTRIAXONE <=0.12 SENSITIVE Sensitive     LEVOFLOXACIN 1 SENSITIVE Sensitive     PENICILLIN Value in next row Sensitive      SENSITIVE<=0.06    * STREPTOCOCCUS AGALACTIAE  Blood Culture ID Panel (Reflexed)     Status: Abnormal   Collection Time: 09/06/21 12:34 PM  Result Value Ref Range Status   Enterococcus faecalis NOT DETECTED NOT DETECTED Final   Enterococcus Faecium NOT DETECTED NOT DETECTED Final   Listeria monocytogenes NOT DETECTED NOT DETECTED Final   Staphylococcus species NOT DETECTED NOT DETECTED Final   Staphylococcus aureus (BCID) NOT DETECTED NOT DETECTED Final   Staphylococcus epidermidis NOT DETECTED NOT DETECTED Final   Staphylococcus lugdunensis NOT DETECTED NOT DETECTED Final   Streptococcus species DETECTED (A) NOT DETECTED Final    Comment: CRITICAL RESULT CALLED TO, READ BACK BY AND VERIFIED WITH: V BRYK,PHARMD@[id number]  09/07/21 [place]    Streptococcus agalactiae DETECTED (A) NOT DETECTED Final    Comment: CRITICAL RESULT CALLED TO, READ BACK BY AND VERIFIED WITH: V BRYK,PHARMD@[id number]  09/07/21 [place]    Streptococcus  pneumoniae NOT DETECTED NOT DETECTED Final   Streptococcus pyogenes NOT DETECTED NOT DETECTED Final   A.calcoaceticus-baumannii NOT DETECTED NOT DETECTED Final   Bacteroides fragilis NOT DETECTED NOT DETECTED Final   Enterobacterales NOT DETECTED NOT DETECTED Final   Enterobacter cloacae complex NOT DETECTED NOT DETECTED Final   Escherichia coli NOT DETECTED NOT DETECTED Final   Klebsiella aerogenes NOT DETECTED NOT DETECTED Final   Klebsiella oxytoca NOT DETECTED NOT DETECTED Final   Klebsiella pneumoniae NOT DETECTED NOT DETECTED Final   Proteus species NOT DETECTED NOT DETECTED Final   Salmonella species NOT DETECTED NOT DETECTED Final   Serratia marcescens NOT DETECTED NOT DETECTED Final   Haemophilus influenzae NOT DETECTED NOT DETECTED Final   Neisseria meningitidis NOT DETECTED NOT DETECTED Final    Pseudomonas aeruginosa NOT DETECTED NOT DETECTED Final   Stenotrophomonas maltophilia NOT DETECTED NOT DETECTED Final   Candida albicans NOT DETECTED NOT DETECTED Final   Candida auris NOT DETECTED NOT DETECTED Final   Candida glabrata NOT DETECTED NOT DETECTED Final   Candida krusei NOT DETECTED NOT DETECTED Final   Candida parapsilosis NOT DETECTED NOT DETECTED Final   Candida tropicalis NOT DETECTED NOT DETECTED Final   Cryptococcus neoformans/gattii NOT DETECTED NOT DETECTED Final    Comment: Performed at [facility], [address], [place], [place] [address]  Culture, blood (routine x 2)     Status: Abnormal   Collection Time: 09/06/21 12:46 PM   Specimen: BLOOD RIGHT HAND  Result Value Ref Range Status   Specimen Description BLOOD RIGHT HAND  Final   Special Requests   Final    BOTTLES DRAWN AEROBIC AND ANAEROBIC Blood Culture adequate volume   Culture  Setup Time   Final    GRAM POSITIVE COCCI IN CHAINS IN BOTH AEROBIC AND ANAEROBIC BOTTLES CRITICAL VALUE NOTED.  VALUE IS CONSISTENT WITH PREVIOUSLY REPORTED AND CALLED VALUE.    Culture (A)  Final    STREPTOCOCCUS AGALACTIAE SUSCEPTIBILITIES PERFORMED ON PREVIOUS CULTURE WITHIN THE LAST 5 DAYS. Performed at [facility], [place] [address], [place], [place] [address]    Report Status 09/09/2021 FINAL  Final  Culture, blood (Routine X 2) w Reflex to ID Panel     Status: None   Collection Time: 09/08/21  5:30 AM   Specimen: BLOOD LEFT HAND  Result Value Ref Range Status   Specimen Description BLOOD LEFT HAND  Final   Special Requests AEROBIC BOTTLE ONLY Blood Culture adequate volume  Final   Culture   Final    NO GROWTH 5 DAYS Performed at [facility], [place] [address] [place], [place] [address]    Report Status 09/13/2021 FINAL  Final  Culture, blood (routine x 2)     Status: None   Collection Time: 09/08/21  5:31 AM   Specimen: BLOOD RIGHT HAND  Result Value Ref Range Status   Specimen Description  BLOOD RIGHT HAND  Final   Special Requests AEROBIC BOTTLE ONLY Blood Culture adequate volume  Final   Culture   Final    NO GROWTH 5 DAYS Performed at [facility], [place] [address] [place], [place] [address]    Report Status 09/13/2021 FINAL  Final    Coagulation Studies: No results for input(s): LABPROT, INR in the last 72 hours.  Urinalysis: No results for input(s): COLORURINE, LABSPEC, PHURINE, GLUCOSEU, HGBUR, BILIRUBINUR, KETONESUR, PROTEINUR, UROBILINOGEN, NITRITE, LEUKOCYTESUR in the last 72 hours.  Invalid input(s): APPERANCEUR    Imaging: No  results found.   Medications:    sodium chloride     sodium chloride 10 mL/hr at 09/13/21 1644   pencillin G potassium IV 4 Million Units (09/15/21 0420)   sodium chloride irrigation      acyclovir  200 mg Oral Q12H   Chlorhexidine Gluconate Cloth  6 each Topical Daily   feeding supplement (NEPRO CARB STEADY)  237 mL Oral TID BM   folic acid  2 mg Oral Daily   Gerhardt's butt cream   Topical BID   melatonin  5 mg Oral QHS   nicotine  14 mg Transdermal Q24H   pantoprazole  40 mg Oral BID AC   sodium bicarbonate  1,300 mg Oral TID   tamsulosin  0.4 mg Oral Daily   acetaminophen, ALPRAZolam, bisacodyl, diphenhydrAMINE, lidocaine, ondansetron **OR** ondansetron (ZOFRAN) IV, oxyCODONE, prochlorperazine, promethazine, technetium albumin aggregated, traMADol, traZODone  Assessment/ Plan:   #Acute kidney injury, nonoliguric, with history of excessive NSAID's use/BC powders every day.  Peaked creatinine level of 6.95. [name]  He underwent IR guided kidney biopsy on 9/6 complicated by postbiopsy bleeding.  Renal biopsy: AL amyloidosis, lambda light chain composition (glomeruli, interstitium, arteries, and arterioles). Marked IF, 59% global glomerular sclerosis and marked arteriosclerosis.   -He had right IJ temporary HD catheter placed on 9/8 in IR during embolization of the bleeding artery.   -Suspected uremia given persistent lack  of appetite, dysgeusia, hiccups therefore proceeded with HD on 9/16 (slow start protocol).  He has had no further dialysis treatments although his creatinine does not appear to be improving it is stable.  Continue to avoid nephrotoxins.  There are no absolute indications for dialysis.  However renal function continues to remain poor.   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  Per primary service    #Pulm edema appears to have resolved with good diuresis    #Anemia, appears to be relatively stable with a hemoglobin is unchanged   #History of nephrolithiasis: Followed by urology   #Metabolic acidosis: Appears to have resolved continue oral sodium bicarbonate at this point  Remove temporary dialysis catheter at this point.  He can follow-up with [facility] as an outpatient.  Unfortunately creatinine has failed to improve significantly.  We may be looking at long-term dialysis.  There are no urgent indications at this point.   LOS: [place] @TODAY @8 :42 AM

## 2021-09-16 DIAGNOSIS — E8581 Light chain (AL) amyloidosis: Secondary | ICD-10-CM | POA: Diagnosis not present

## 2021-09-16 DIAGNOSIS — R7881 Bacteremia: Secondary | ICD-10-CM | POA: Diagnosis not present

## 2021-09-16 DIAGNOSIS — B951 Streptococcus, group B, as the cause of diseases classified elsewhere: Secondary | ICD-10-CM | POA: Diagnosis not present

## 2021-09-16 LAB — RENAL FUNCTION PANEL
Albumin: 1.6 g/dL — ABNORMAL LOW (ref 3.5–5.0)
Anion gap: 13 (ref 5–15)
BUN: 42 mg/dL — ABNORMAL HIGH (ref 8–23)
CO2: 21 mmol/L — ABNORMAL LOW (ref 22–32)
Calcium: 8.2 mg/dL — ABNORMAL LOW (ref 8.9–10.3)
Chloride: 101 mmol/L (ref 98–111)
Creatinine, Ser: 5.48 mg/dL — ABNORMAL HIGH (ref 0.61–1.24)
GFR, Estimated: 10 mL/min — ABNORMAL LOW (ref >60)
Glucose, Bld: 91 mg/dL (ref 70–99)
Phosphorus: 5.9 mg/dL — ABNORMAL HIGH (ref 2.5–4.6)
Potassium: 5.8 mmol/L — ABNORMAL HIGH (ref 3.5–5.1)
Sodium: 135 mmol/L (ref 135–145)

## 2021-09-16 LAB — CBC WITH DIFFERENTIAL/PLATELET
Abs Immature Granulocytes: 0.14 10*3/uL — ABNORMAL HIGH (ref 0.00–0.07)
Basophils Absolute: 0.2 10*3/uL — ABNORMAL HIGH (ref 0.0–0.1)
Basophils Relative: 2 %
Eosinophils Absolute: 0.3 10*3/uL (ref 0.0–0.5)
Eosinophils Relative: 3 %
HCT: 29.9 % — ABNORMAL LOW (ref 39.0–52.0)
Hemoglobin: 9.3 g/dL — ABNORMAL LOW (ref 13.0–17.0)
Immature Granulocytes: 1 %
Lymphocytes Relative: 21 %
Lymphs Abs: 2.3 10*3/uL (ref 0.7–4.0)
MCH: 26.6 pg (ref 26.0–34.0)
MCHC: 31.1 g/dL (ref 30.0–36.0)
MCV: 85.7 fL (ref 80.0–100.0)
Monocytes Absolute: 0.8 10*3/uL (ref 0.1–1.0)
Monocytes Relative: 8 %
Neutro Abs: 7.2 10*3/uL (ref 1.7–7.7)
Neutrophils Relative %: 65 %
Platelets: 458 10*3/uL — ABNORMAL HIGH (ref 150–400)
RBC: 3.49 MIL/uL — ABNORMAL LOW (ref 4.22–5.81)
RDW: 18.9 % — ABNORMAL HIGH (ref 11.5–15.5)
WBC: 11 10*3/uL — ABNORMAL HIGH (ref 4.0–10.5)
nRBC: 0 % (ref 0.0–0.2)

## 2021-09-16 LAB — BASIC METABOLIC PANEL
Anion gap: 9 (ref 5–15)
BUN: 39 mg/dL — ABNORMAL HIGH (ref 8–23)
CO2: 23 mmol/L (ref 22–32)
Calcium: 8.6 mg/dL — ABNORMAL LOW (ref 8.9–10.3)
Chloride: 101 mmol/L (ref 98–111)
Creatinine, Ser: 5.69 mg/dL — ABNORMAL HIGH (ref 0.61–1.24)
GFR, Estimated: 10 mL/min — ABNORMAL LOW (ref >60)
Glucose, Bld: 83 mg/dL (ref 70–99)
Potassium: 5.3 mmol/L — ABNORMAL HIGH (ref 3.5–5.1)
Sodium: 133 mmol/L — ABNORMAL LOW (ref 135–145)

## 2021-09-16 MED ORDER — SODIUM ZIRCONIUM CYCLOSILICATE 10 G PO PACK
10.0000 g | PACK | Freq: Three times a day (TID) | ORAL | Status: DC
  Administered 2021-09-16 – 2021-09-19 (×9): 10 g via ORAL
  Filled 2021-09-16 (×10): qty 1

## 2021-09-16 MED ORDER — SODIUM ZIRCONIUM CYCLOSILICATE 10 G PO PACK
10.0000 g | PACK | Freq: Once | ORAL | Status: AC
  Administered 2021-09-16: 10 g via ORAL
  Filled 2021-09-16: qty 1

## 2021-09-16 NOTE — Progress Notes (Signed)
[facility] PROGRESS NOTE    Progress Note  Caleb Simmons  XVQ:[id number] DOB: October 02, 1951 DOA: 08/23/2021 PCP: Pcp, No     Brief Narrative:   Caleb Simmons is an 70 y.o. male past medical history of nephrolithiasis with no other medical problems, but he does not see a primary care doctor regularly comes into the ED complaining of weakness was found to be in acute kidney injury and severe normocytic anemia.  Seen by nephrology who recommended renal biopsy done on 08/28/2021 unfortunately developed post renal bleeding with clots requiring an insertion of a CBI he is status post 2 units of packed red blood cells but he continued to bleed so IR performed embolization on 08/30/2021, renal biopsy showed amyloidosis.  Oncology has been consulted.  Also found to have strep agalactiae bacteremia.  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 The plan is to continue to administer weekly chemotherapy next 1 due on 09/17/2021. Oncology recommended acyclovir.  AKI (acute kidney injury) ([place]) secondary to amyloid disease/hyperkalemia: Despite good urine output, renal function continues to deteriorate. Now her potassium is trending up at 5.8 she was given Lokelma. Last HD treatment was on 09/07/2021.  Patient had a right IJ HD catheter placed on 08/30/2021 He is status post biopsied on 08/28/2021  . Oncology has also been consulted for plasma cell myeloma and started on chemotherapy treatment on 09/10/2021 Further management per renal.  Sepsis secondary to strep recollected bacteremia: 2D echo was done that showed no vegetation, but it did show a small drop in his EF. Continue diuresis penicillin and Rocephin ID was consulted who recommended treatment for total 10 days, his last day of treatment will be today.  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 The advanced heart failure team was consulted they recommended a cardiac MRI on 09/10/2021 was suggestive of cardiac amyloid, Cardiology recommended chemotherapy, oncology is on board. No diuretics needed at this time.  Cardiology has signed off.  Gross hematuria/acute urinary retention due to bleeding in the collecting duct of the right kidney post renal biopsy: Foley was discontinued 08/30/2021. He required multiple transfusions. Status post embolization on 08/30/2021. Further management per urology.  Anxiety: Continue Xanax aware that we will not continue Xanax postdischarge, continue trazodone. He is not interested in other medications like SSRI.  Tobacco abuse: Continue nicotine patch.   DVT prophylaxis: lovenxo Family Communication:noen Status is: Inpatient  Remains inpatient appropriate because:Hemodynamically unstable  Dispo: The patient is from: Home              Anticipated d/c is to: Home              Patient currently is not medically stable to d/c.   Difficult to place patient No   Code Status:     Code Status Orders  (From  admission, onward)           Start     Ordered   08/23/21 1752  Full code  Continuous        08/23/21 1751           Code Status History     This patient has a current code status but no historical code status.          IV Access:   Peripheral IV   Procedures and diagnostic studies:   No results found.   Medical Consultants:   None.   Subjective:    Caleb Simmons nausea improve tolerating his diet.  Objective:    Vitals:   09/15/21 1521 09/15/21 2030 09/16/21 0417 09/16/21 0442  BP: 104/66 109/62 102/67   Pulse: 86 83 74   Resp: 17 19 19    Temp: 97.8 F (36.6 C) 97.8 F (36.6 C) 97.7 F (36.5 C)   TempSrc: Oral Oral Oral   SpO2: 99% 94% 94%   Weight:    72.3 kg  Height:       SpO2: 94 % O2 Flow Rate (L/min): 2 L/min   Intake/Output Summary (Last 24 hours) at 09/16/2021 0747 Last data filed at 09/16/2021 0418 Gross per 24 hour  Intake 620 ml  Output 2400 ml  Net -1780 ml    Filed Weights   09/14/21 0710 09/15/21 0423 09/16/21 0442  Weight: 72.7 kg 72 kg 72.3 kg    Exam: General exam: In no acute distress. Respiratory system: Good air movement and clear to auscultation. Cardiovascular system: S1 & S2 heard, RRR. No JVD. Gastrointestinal system: Abdomen is nondistended, soft and nontender.  Extremities: No pedal edema. Skin: No rashes, lesions or ulcers Psychiatry: Judgement and insight appear normal. Mood & affect appropriate.  Data Reviewed:    Labs: Basic Metabolic Panel: Recent Labs  Lab 09/12/21 0217 09/13/21 0425 09/14/21 0430 09/15/21 0144 09/16/21 0139  NA 134* 135 135 132* 135  K 4.6 4.8 4.8 4.8 5.8*  CL 101 101 102 100 101  CO2 22 23 24 22  21*  GLUCOSE 96 96 99 95 91  BUN 39* 39* 38* 39* 42*  CREATININE 5.09* 5.09* 5.31* 5.37* 5.48*  CALCIUM 7.9* 8.1* 8.3* 8.1* 8.2*  PHOS 3.6 4.5 5.0* 5.3* 5.9*    GFR Estimated Creatinine Clearance: 12.6 mL/min (A) (by C-G formula based on SCr of 5.48 mg/dL (H)). Liver Function Tests: Recent Labs  Lab 09/12/21 0217 09/13/21 0425 09/14/21 0430 09/15/21 0144 09/16/21 0139  ALBUMIN 1.6* 1.6* 1.6* 1.6* 1.6*    No results for input(s): LIPASE, AMYLASE in the last 168 hours. No results  for input(s): AMMONIA in the last 168 hours. Coagulation profile No results for input(s): INR, PROTIME in the last 168 hours. COVID-19 Labs  No results for input(s): DDIMER, FERRITIN, LDH, CRP in the last 72 hours.  Lab Results  Component Value Date   [place] NEGATIVE 08/23/2021    CBC: Recent Labs  Lab 09/12/21 0217 09/13/21 0425 09/14/21 0430 09/15/21 0144 09/16/21 0139  WBC 10.7* 13.3* 12.2* 11.5* 11.0*  NEUTROABS 6.5 8.8* 8.1* 7.2 7.2  HGB 9.6* 9.6* 9.3* 9.2* 9.3*  HCT 30.4* 30.1* 29.2* 28.4* 29.9*  MCV 84.9 84.3 85.1 83.5 85.7  PLT 473* 483* 434* 437* 458*    Cardiac Enzymes: No results for input(s): CKTOTAL, CKMB, CKMBINDEX, TROPONINI in the last 168 hours. BNP (last 3 results) No results for input(s): PROBNP in the last 8760 hours. CBG: No results for  input(s): GLUCAP in the last 168 hours. D-Dimer: No results for input(s): DDIMER in the last 72 hours. Hgb A1c: No results for input(s): HGBA1C in the last 72 hours. Lipid Profile: No results for input(s): CHOL, HDL, LDLCALC, TRIG, CHOLHDL, LDLDIRECT in the last 72 hours. Thyroid function studies: No results for input(s): TSH, T4TOTAL, T3FREE, THYROIDAB in the last 72 hours.  Invalid input(s): FREET3 Anemia work up: No results for input(s): VITAMINB12, FOLATE, FERRITIN, TIBC, IRON, RETICCTPCT in the last 72 hours. Sepsis Labs: Recent Labs  Lab 09/13/21 0425 09/14/21 0430 09/15/21 0144 09/16/21 0139  WBC 13.3* 12.2* 11.5* 11.0*    Microbiology Recent Results (from the past 240 hour(s))  Urine Culture     Status: Abnormal   Collection Time: 09/06/21 12:29 PM   Specimen: Urine, Clean Catch  Result Value Ref Range Status   Specimen Description URINE, CLEAN CATCH  Final   Special Requests NONE  Final   Culture (A)  Final    >=100,000 COLONIES/mL GROUP B STREP(S.AGALACTIAE)ISOLATED TESTING AGAINST S. AGALACTIAE NOT ROUTINELY PERFORMED DUE TO PREDICTABILITY OF AMP/PEN/VAN SUSCEPTIBILITY. Performed  at [facility], [place] [address], [place], [place] [address]    Report Status 09/07/2021 FINAL  Final  Culture, blood (routine x 2)     Status: Abnormal   Collection Time: 09/06/21 12:34 PM   Specimen: BLOOD RIGHT HAND  Result Value Ref Range Status   Specimen Description BLOOD RIGHT HAND  Final   Special Requests   Final    BOTTLES DRAWN AEROBIC AND ANAEROBIC Blood Culture adequate volume   Culture  Setup Time   Final    GRAM POSITIVE COCCI IN CHAINS IN BOTH AEROBIC AND ANAEROBIC BOTTLES CRITICAL RESULT CALLED TO, READ BACK BY AND VERIFIED WITH: V BRYK,PHARMD@[id number]  09/07/21 [place] Performed at [facility], [place] [address] [place], [place] [address]    Culture STREPTOCOCCUS AGALACTIAE (A)  Final   Report Status 09/09/2021 FINAL  Final   Organism ID, Bacteria STREPTOCOCCUS AGALACTIAE  Final      Susceptibility   Streptococcus agalactiae - MIC*    CLINDAMYCIN >=1 RESISTANT Resistant     ERYTHROMYCIN >=8 RESISTANT Resistant     VANCOMYCIN 0.5 SENSITIVE Sensitive     CEFTRIAXONE <=0.12 SENSITIVE Sensitive     LEVOFLOXACIN 1 SENSITIVE Sensitive     PENICILLIN Value in next row Sensitive      SENSITIVE<=0.06    * STREPTOCOCCUS AGALACTIAE  Blood Culture ID Panel (Reflexed)     Status: Abnormal   Collection Time: 09/06/21 12:34 PM  Result Value Ref Range Status   Enterococcus faecalis NOT DETECTED NOT DETECTED Final   Enterococcus Faecium NOT DETECTED NOT DETECTED Final   Listeria monocytogenes NOT DETECTED NOT DETECTED Final   Staphylococcus species NOT DETECTED NOT DETECTED Final   Staphylococcus aureus (BCID) NOT DETECTED NOT DETECTED Final   Staphylococcus epidermidis NOT DETECTED NOT DETECTED Final   Staphylococcus lugdunensis NOT DETECTED NOT DETECTED Final   Streptococcus species DETECTED (A) NOT DETECTED Final    Comment: CRITICAL RESULT CALLED TO, READ BACK BY AND VERIFIED WITH: V BRYK,PHARMD@[id number]  09/07/21 [place]    Streptococcus agalactiae DETECTED (A) NOT DETECTED  Final    Comment: CRITICAL RESULT CALLED TO, READ BACK BY AND VERIFIED WITH: V BRYK,PHARMD@[id number]  09/07/21 [place]    Streptococcus pneumoniae NOT DETECTED NOT DETECTED Final   Streptococcus pyogenes NOT DETECTED NOT DETECTED Final   A.calcoaceticus-baumannii NOT DETECTED NOT DETECTED Final   Bacteroides fragilis NOT DETECTED NOT DETECTED Final  Enterobacterales NOT DETECTED NOT DETECTED Final   Enterobacter cloacae complex NOT DETECTED NOT DETECTED Final   Escherichia coli NOT DETECTED NOT DETECTED Final   Klebsiella aerogenes NOT DETECTED NOT DETECTED Final   Klebsiella oxytoca NOT DETECTED NOT DETECTED Final   Klebsiella pneumoniae NOT DETECTED NOT DETECTED Final   Proteus species NOT DETECTED NOT DETECTED Final   Salmonella species NOT DETECTED NOT DETECTED Final   Serratia marcescens NOT DETECTED NOT DETECTED Final   Haemophilus influenzae NOT DETECTED NOT DETECTED Final   Neisseria meningitidis NOT DETECTED NOT DETECTED Final   Pseudomonas aeruginosa NOT DETECTED NOT DETECTED Final   Stenotrophomonas maltophilia NOT DETECTED NOT DETECTED Final   Candida albicans NOT DETECTED NOT DETECTED Final   Candida auris NOT DETECTED NOT DETECTED Final   Candida glabrata NOT DETECTED NOT DETECTED Final   Candida krusei NOT DETECTED NOT DETECTED Final   Candida parapsilosis NOT DETECTED NOT DETECTED Final   Candida tropicalis NOT DETECTED NOT DETECTED Final   Cryptococcus neoformans/gattii NOT DETECTED NOT DETECTED Final    Comment: Performed at [facility], [place] [address] [place], [place] [address]  Culture, blood (routine x 2)     Status: Abnormal   Collection Time: 09/06/21 12:46 PM   Specimen: BLOOD RIGHT HAND  Result Value Ref Range Status   Specimen Description BLOOD RIGHT HAND  Final   Special Requests   Final    BOTTLES DRAWN AEROBIC AND ANAEROBIC Blood Culture adequate volume   Culture  Setup Time   Final    GRAM POSITIVE COCCI IN CHAINS IN BOTH AEROBIC AND ANAEROBIC  BOTTLES CRITICAL VALUE NOTED.  VALUE IS CONSISTENT WITH PREVIOUSLY REPORTED AND CALLED VALUE.    Culture (A)  Final    STREPTOCOCCUS AGALACTIAE SUSCEPTIBILITIES PERFORMED ON PREVIOUS CULTURE WITHIN THE LAST 5 DAYS. Performed at [facility], [place] [address], [place], [place] [address]    Report Status 09/09/2021 FINAL  Final  Culture, blood (Routine X 2) w Reflex to ID Panel     Status: None   Collection Time: 09/08/21  5:30 AM   Specimen: BLOOD LEFT HAND  Result Value Ref Range Status   Specimen Description BLOOD LEFT HAND  Final   Special Requests AEROBIC BOTTLE ONLY Blood Culture adequate volume  Final   Culture   Final    NO GROWTH 5 DAYS Performed at [facility], [place] [address], [place], [place] [address]    Report Status 09/13/2021 FINAL  Final  Culture, blood (routine x 2)     Status: None   Collection Time: 09/08/21  5:31 AM   Specimen: BLOOD RIGHT HAND  Result Value Ref Range Status   Specimen Description BLOOD RIGHT HAND  Final   Special Requests AEROBIC BOTTLE ONLY Blood Culture adequate volume  Final   Culture   Final    NO GROWTH 5 DAYS Performed at [facility], [place] [address], [place], [place] [address]    Report Status 09/13/2021 FINAL  Final     Medications:    acyclovir  200 mg Oral Q12H   feeding supplement (NEPRO CARB STEADY)  237 mL Oral TID BM   folic acid  2 mg Oral Daily   Gerhardt's butt cream   Topical BID   melatonin  5 mg Oral QHS   nicotine  14 mg Transdermal Q24H   pantoprazole  40 mg Oral BID AC   sodium bicarbonate  1,300 mg Oral TID   tamsulosin  0.4 mg Oral Daily   Continuous Infusions:  sodium chloride     sodium chloride 10 mL/hr at 09/13/21 1644   pencillin G potassium IV 4 Million Units (09/16/21 0418)   sodium chloride irrigation        LOS: 24 days   Charlynne Cousins  [facility]  09/16/2021, 7:47 AM

## 2021-09-16 NOTE — Progress Notes (Signed)
HOSPITAL MEDICINE OVERNIGHT EVENT NOTE    Notified by nursing that patient's potassium this morning is 5.8.  EKG ordered, Lokelma ordered.  Monitoring patient on telemetry.  Will reassess potassium level later this morning.  Vernelle Emerald  MD [facility]

## 2021-09-16 NOTE — Plan of Care (Signed)
  Problem: Fluid Volume: Goal: Compliance with measures to maintain balanced fluid volume will improve Outcome: Progressing   Problem: Nutritional: Goal: Ability to make healthy dietary choices will improve Outcome: Progressing   Problem: Clinical Measurements: Goal: Complications related to the disease process, condition or treatment will be avoided or minimized Outcome: Progressing   Problem: Education: Goal: Knowledge of General Education information will improve Description: Including pain rating scale, medication(s)/side effects and non-pharmacologic comfort measures Outcome: Progressing   Problem: Clinical Measurements: Goal: Ability to maintain clinical measurements within normal limits will improve Outcome: Progressing   Problem: Clinical Measurements: Goal: Diagnostic test results will improve Outcome: Progressing   Problem: Clinical Measurements: Goal: Cardiovascular complication will be avoided Outcome: Progressing   Problem: Activity: Goal: Risk for activity intolerance will decrease Outcome: Progressing   Problem: Nutrition: Goal: Adequate nutrition will be maintained Outcome: Progressing   Problem: Coping: Goal: Level of anxiety will decrease Outcome: Progressing   Problem: Pain Managment: Goal: General experience of comfort will improve Outcome: Progressing   Problem: Safety: Goal: Ability to remain free from injury will improve Outcome: Progressing   Problem: Skin Integrity: Goal: Risk for impaired skin integrity will decrease Outcome: Progressing

## 2021-09-16 NOTE — Progress Notes (Addendum)
[facility] ROUNDING NOTE   Subjective:   Interval History: 70 year old nephrolithiasis generalized weakness presented with acute kidney injury.  Status post renal biopsy 08/28/2021.  Had postbiopsy bleeding into the renal collecting system with clot retention.  Demonstration of three-way catheter performed.  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  She is undergoing dialysis 09/07/2021.  We are dialyzing on a as needed basis.  We are coordinating treatment with his chemotherapy.  Last dialysis was 09/07/2021 with 2.4 L removed.  He is making good urine with 1.9 L    09/15/2021    we discussed dialysis and/or palliative medicine.  He has had a lot of losses last year . Wants to give more thought to this. No urgent indications for dialysis. 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 MRI suggestive of cardiac involvement   Blood pressure 102/67 pulse 78 temperature 97.7 O2 sats 94% room air  Sodium 133 potassium 5.3 CO2 23 BUN 39 creatinine 5.69 glucose 83 calcium 8.6 hemoglobin 9.3    Objective:  Vital signs in last 24 hours:  Temp:  [97.7 F (36.5 C)-97.8 F (36.6 C)] 97.7 F (36.5 C) (09/25 0417) Pulse Rate:  [74-86] 74 (09/25 0417) Resp:  [17-19] 19 (09/25 0417) BP: (102-109)/(62-67) 102/67 (09/25 0417) SpO2:  [94 %-99 %] 94 % (09/25 0417) Weight:  [72.3 kg] 72.3 kg (09/25 0442)  Weight change: -0.412 kg Filed Weights   09/14/21 0710 09/15/21 0423 09/16/21 0442  Weight: 72.7 kg 72 kg 72.3 kg    Intake/Output: I/O last 3 completed shifts: In: 39 [P.O.:120; IV Piggyback:500] Out: 2778 [Urine:3340]   Intake/Output this shift:  Total I/O In: -  Out: 350 [Urine:350]  General: nad Heart: rrr Lungs: normal wob, cta bl Abdomen: Soft, nontender Extremities: No LE edema. Neurology: Alert, awake, following commands, no asterixis Vascular Access: Right IJ temporary HD catheter in  place.   Basic Metabolic Panel: Recent Labs  Lab 09/12/21 0217 09/13/21 0425 09/14/21 0430 09/15/21 0144 09/16/21 0139 09/16/21 0737  NA 134* 135 135 132* 135 133*  K 4.6 4.8 4.8 4.8 5.8* 5.3*  CL 101 101 102 100 101 101  CO2 22 23 24 22  21* 23  GLUCOSE 96 96 99 95 91 83  BUN 39* 39* 38* 39* 42* 39*  CREATININE 5.09* 5.09* 5.31* 5.37* 5.48* 5.69*  CALCIUM 7.9* 8.1* 8.3* 8.1* 8.2* 8.6*  PHOS 3.6 4.5 5.0* 5.3* 5.9*  --      Liver Function Tests: Recent Labs  Lab 09/12/21 0217 09/13/21 0425 09/14/21 0430 09/15/21 0144 09/16/21 0139  ALBUMIN 1.6* 1.6* 1.6* 1.6* 1.6*    No results for input(s): LIPASE, AMYLASE in the last 168 hours. No results for input(s): AMMONIA in the last 168 hours.  CBC: Recent Labs  Lab 09/12/21 0217 09/13/21 0425 09/14/21 0430 09/15/21 0144 09/16/21 0139  WBC 10.7* 13.3* 12.2* 11.5* 11.0*  NEUTROABS 6.5 8.8* 8.1* 7.2 7.2  HGB 9.6* 9.6* 9.3* 9.2* 9.3*  HCT 30.4* 30.1* 29.2* 28.4* 29.9*  MCV 84.9 84.3 85.1 83.5 85.7  PLT 473* 483* 434* 437* 458*     Cardiac Enzymes: No results for input(s): CKTOTAL, CKMB, CKMBINDEX, TROPONINI in the last 168 hours.  BNP: Invalid input(s): POCBNP  CBG: No results for input(s): GLUCAP in the last 168 hours.  Microbiology: Results for orders placed or performed during the hospital encounter of 08/23/21  SARS CORONAVIRUS 2 (TAT 6-24 HRS) Nasopharyngeal Nasopharyngeal Swab  Status: None   Collection Time: 08/23/21  5:11 PM   Specimen: Nasopharyngeal Swab  Result Value Ref Range Status   SARS Coronavirus 2 NEGATIVE NEGATIVE Final    Comment: (NOTE) SARS-CoV-2 target nucleic acids are NOT DETECTED.  The SARS-CoV-2 RNA is generally detectable in upper and lower respiratory specimens during the acute phase of infection. Negative results do not preclude SARS-CoV-2 infection, do not rule out co-infections with other pathogens, and should not be used as the sole basis for treatment or other  patient management decisions. Negative results must be combined with clinical observations, patient history, and epidemiological information. The expected result is Negative.  Fact Sheet for Patients: SugarRoll.be  Fact Sheet for Healthcare Providers: https://www.woods-mathews.com/  This test is not yet approved or cleared by the [place] FDA and  has been authorized for detection and/or diagnosis of SARS-CoV-2 by FDA under an Emergency Use Authorization (EUA). This EUA will remain  in effect (meaning this test can be used) for the duration of the COVID-19 declaration under Se ction 564(b)(1) of the Act, 21 U.S.C. section 360bbb-3(b)(1), unless the authorization is terminated or revoked sooner.  Performed at [facility], [place] [address] [place], [place] [address]   Urine Culture     Status: Abnormal   Collection Time: 09/06/21 12:29 PM   Specimen: Urine, Clean Catch  Result Value Ref Range Status   Specimen Description URINE, CLEAN CATCH  Final   Special Requests NONE  Final   Culture (A)  Final    >=100,000 COLONIES/mL GROUP B STREP(S.AGALACTIAE)ISOLATED TESTING AGAINST S. AGALACTIAE NOT ROUTINELY PERFORMED DUE TO PREDICTABILITY OF AMP/PEN/VAN SUSCEPTIBILITY. Performed at [facility], [place] [address] [place], [place] [address]    Report Status 09/07/2021 FINAL  Final  Culture, blood (routine x 2)     Status: Abnormal   Collection Time: 09/06/21 12:34 PM   Specimen: BLOOD RIGHT HAND  Result Value Ref Range Status   Specimen Description BLOOD RIGHT HAND  Final   Special Requests   Final    BOTTLES DRAWN AEROBIC AND ANAEROBIC Blood Culture adequate volume   Culture  Setup Time   Final    GRAM POSITIVE COCCI IN CHAINS IN BOTH AEROBIC AND ANAEROBIC BOTTLES CRITICAL RESULT CALLED TO, READ BACK BY AND VERIFIED WITH: V BRYK,[id number]  09/07/21 [place] Performed at [facility], [place] [address], [place], [place]  [address]    Culture STREPTOCOCCUS AGALACTIAE (A)  Final   Report Status 09/09/2021 FINAL  Final   Organism ID, Bacteria STREPTOCOCCUS AGALACTIAE  Final      Susceptibility   Streptococcus agalactiae - MIC*    CLINDAMYCIN >=1 RESISTANT Resistant     ERYTHROMYCIN >=8 RESISTANT Resistant     VANCOMYCIN 0.5 SENSITIVE Sensitive     CEFTRIAXONE <=0.12 SENSITIVE Sensitive     LEVOFLOXACIN 1 SENSITIVE Sensitive     PENICILLIN Value in next row Sensitive      SENSITIVE<=0.06    * STREPTOCOCCUS AGALACTIAE  Blood Culture ID Panel (Reflexed)     Status: Abnormal   Collection Time: 09/06/21 12:34 PM  Result Value Ref Range Status   Enterococcus faecalis NOT DETECTED NOT DETECTED Final   Enterococcus Faecium NOT DETECTED NOT DETECTED Final   Listeria monocytogenes NOT DETECTED NOT DETECTED Final   Staphylococcus species NOT DETECTED NOT DETECTED Final   Staphylococcus aureus (BCID) NOT DETECTED NOT DETECTED Final   Staphylococcus epidermidis NOT DETECTED NOT DETECTED Final   Staphylococcus lugdunensis NOT DETECTED NOT DETECTED  Final   Streptococcus species DETECTED (A) NOT DETECTED Final    Comment: CRITICAL RESULT CALLED TO, READ BACK BY AND VERIFIED WITH: V BRYK,PHARMD@[id number]  09/07/21 [place]    Streptococcus agalactiae DETECTED (A) NOT DETECTED Final    Comment: CRITICAL RESULT CALLED TO, READ BACK BY AND VERIFIED WITH: V BRYK,PHARMD@[id number]  09/07/21 [place]    Streptococcus pneumoniae NOT DETECTED NOT DETECTED Final   Streptococcus pyogenes NOT DETECTED NOT DETECTED Final   A.calcoaceticus-baumannii NOT DETECTED NOT DETECTED Final   Bacteroides fragilis NOT DETECTED NOT DETECTED Final   Enterobacterales NOT DETECTED NOT DETECTED Final   Enterobacter cloacae complex NOT DETECTED NOT DETECTED Final   Escherichia coli NOT DETECTED NOT DETECTED Final   Klebsiella aerogenes NOT DETECTED NOT DETECTED Final   Klebsiella oxytoca NOT DETECTED NOT DETECTED Final   Klebsiella pneumoniae NOT DETECTED NOT DETECTED  Final   Proteus species NOT DETECTED NOT DETECTED Final   Salmonella species NOT DETECTED NOT DETECTED Final   Serratia marcescens NOT DETECTED NOT DETECTED Final   Haemophilus influenzae NOT DETECTED NOT DETECTED Final   Neisseria meningitidis NOT DETECTED NOT DETECTED Final   Pseudomonas aeruginosa NOT DETECTED NOT DETECTED Final   Stenotrophomonas maltophilia NOT DETECTED NOT DETECTED Final   Candida albicans NOT DETECTED NOT DETECTED Final   Candida auris NOT DETECTED NOT DETECTED Final   Candida glabrata NOT DETECTED NOT DETECTED Final   Candida krusei NOT DETECTED NOT DETECTED Final   Candida parapsilosis NOT DETECTED NOT DETECTED Final   Candida tropicalis NOT DETECTED NOT DETECTED Final   Cryptococcus neoformans/gattii NOT DETECTED NOT DETECTED Final    Comment: Performed at [facility], [address] [place], [place] [address]  Culture, blood (routine x 2)     Status: Abnormal   Collection Time: 09/06/21 12:46 PM   Specimen: BLOOD RIGHT HAND  Result Value Ref Range Status   Specimen Description BLOOD RIGHT HAND  Final   Special Requests   Final    BOTTLES DRAWN AEROBIC AND ANAEROBIC Blood Culture adequate volume   Culture  Setup Time   Final    GRAM POSITIVE COCCI IN CHAINS IN BOTH AEROBIC AND ANAEROBIC BOTTLES CRITICAL VALUE NOTED.  VALUE IS CONSISTENT WITH PREVIOUSLY REPORTED AND CALLED VALUE.    Culture (A)  Final    STREPTOCOCCUS AGALACTIAE SUSCEPTIBILITIES PERFORMED ON PREVIOUS CULTURE WITHIN THE LAST 5 DAYS. Performed at [facility], [place] [address] [place], [place] [address]    Report Status 09/09/2021 FINAL  Final  Culture, blood (Routine X 2) w Reflex to ID Panel     Status: None   Collection Time: 09/08/21  5:30 AM   Specimen: BLOOD LEFT HAND  Result Value Ref Range Status   Specimen Description BLOOD LEFT HAND  Final   Special Requests AEROBIC BOTTLE ONLY Blood Culture adequate volume  Final   Culture   Final    NO GROWTH 5 DAYS Performed  at [facility], [place] [address], [place], [place] [address]    Report Status 09/13/2021 FINAL  Final  Culture, blood (routine x 2)     Status: None   Collection Time: 09/08/21  5:31 AM   Specimen: BLOOD RIGHT HAND  Result Value Ref Range Status   Specimen Description BLOOD RIGHT HAND  Final   Special Requests AEROBIC BOTTLE ONLY Blood Culture adequate volume  Final   Culture   Final    NO GROWTH 5 DAYS Performed at [facility], [place]  [address], [place], [place] [address]    Report Status 09/13/2021 FINAL  Final    Coagulation Studies: No results for input(s): LABPROT, INR in the last 72 hours.  Urinalysis: No results for input(s): COLORURINE, LABSPEC, PHURINE, GLUCOSEU, HGBUR, BILIRUBINUR, KETONESUR, PROTEINUR, UROBILINOGEN, NITRITE, LEUKOCYTESUR in the last 72 hours.  Invalid input(s): APPERANCEUR    Imaging: No results found.   Medications:    sodium chloride     sodium chloride 10 mL/hr at 09/13/21 1644   pencillin G potassium IV 4 Million Units (09/16/21 0418)   sodium chloride irrigation      acyclovir  200 mg Oral Q12H   feeding supplement (NEPRO CARB STEADY)  237 mL Oral TID BM   folic acid  2 mg Oral Daily   Gerhardt's butt cream   Topical BID   melatonin  5 mg Oral QHS   nicotine  14 mg Transdermal Q24H   pantoprazole  40 mg Oral BID AC   sodium bicarbonate  1,300 mg Oral TID   tamsulosin  0.4 mg Oral Daily   acetaminophen, ALPRAZolam, bisacodyl, diphenhydrAMINE, lidocaine, ondansetron **OR** ondansetron (ZOFRAN) IV, oxyCODONE, prochlorperazine, promethazine, technetium albumin aggregated, traMADol, traZODone  Assessment/ Plan:   #Acute kidney injury, nonoliguric, with history of excessive NSAID's use/BC powders every day.  Peaked creatinine level of 6.95. [name]  He underwent IR guided kidney biopsy on 9/6 complicated by postbiopsy bleeding.  Renal biopsy: AL amyloidosis, lambda light chain composition (glomeruli, interstitium, arteries, and  arterioles). Marked IF, 59% global glomerular sclerosis and marked arteriosclerosis.    He received a one time treatment of dialysis 9/16. However renal function continues to remain poor. 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  Per primary service    #Pulm edema appears to have resolved with good diuresis    #Anemia, appears to be relatively stable with a hemoglobin is unchanged   #History of nephrolithiasis: Followed by urology   #Metabolic acidosis: Appears to have resolved continue oral sodium bicarbonate at this point  #Mild hyperkalemia we will continue Lokelma.  Remove temporary dialysis catheter at this point.  He can follow-up with [facility] as an outpatient.  Unfortunately creatinine has failed to improve significantly.  We may be looking at long-term dialysis.  There are no urgent indications at this point.   LOS: [place] @TODAY @9 :47 AM

## 2021-09-17 ENCOUNTER — Encounter (HOSPITAL_COMMUNITY): Payer: Self-pay | Admitting: Oncology

## 2021-09-17 ENCOUNTER — Telehealth: Payer: Self-pay | Admitting: Hematology

## 2021-09-17 ENCOUNTER — Other Ambulatory Visit: Payer: Self-pay | Admitting: Hematology

## 2021-09-17 DIAGNOSIS — B951 Streptococcus, group B, as the cause of diseases classified elsewhere: Secondary | ICD-10-CM | POA: Diagnosis not present

## 2021-09-17 DIAGNOSIS — N08 Glomerular disorders in diseases classified elsewhere: Secondary | ICD-10-CM

## 2021-09-17 DIAGNOSIS — E8581 Light chain (AL) amyloidosis: Secondary | ICD-10-CM | POA: Diagnosis not present

## 2021-09-17 DIAGNOSIS — E854 Organ-limited amyloidosis: Secondary | ICD-10-CM

## 2021-09-17 DIAGNOSIS — R7881 Bacteremia: Secondary | ICD-10-CM | POA: Diagnosis not present

## 2021-09-17 LAB — CBC WITH DIFFERENTIAL/PLATELET
Abs Immature Granulocytes: 0.14 10*3/uL — ABNORMAL HIGH (ref 0.00–0.07)
Basophils Absolute: 0.2 10*3/uL — ABNORMAL HIGH (ref 0.0–0.1)
Basophils Relative: 2 %
Eosinophils Absolute: 0.2 10*3/uL (ref 0.0–0.5)
Eosinophils Relative: 2 %
HCT: 29.4 % — ABNORMAL LOW (ref 39.0–52.0)
Hemoglobin: 9.3 g/dL — ABNORMAL LOW (ref 13.0–17.0)
Immature Granulocytes: 1 %
Lymphocytes Relative: 20 %
Lymphs Abs: 2 10*3/uL (ref 0.7–4.0)
MCH: 26.9 pg (ref 26.0–34.0)
MCHC: 31.6 g/dL (ref 30.0–36.0)
MCV: 85 fL (ref 80.0–100.0)
Monocytes Absolute: 1 10*3/uL (ref 0.1–1.0)
Monocytes Relative: 10 %
Neutro Abs: 6.6 10*3/uL (ref 1.7–7.7)
Neutrophils Relative %: 65 %
Platelets: 377 10*3/uL (ref 150–400)
RBC: 3.46 MIL/uL — ABNORMAL LOW (ref 4.22–5.81)
RDW: 18.8 % — ABNORMAL HIGH (ref 11.5–15.5)
WBC: 10 10*3/uL (ref 4.0–10.5)
nRBC: 0 % (ref 0.0–0.2)

## 2021-09-17 LAB — RENAL FUNCTION PANEL
Albumin: 1.5 g/dL — ABNORMAL LOW (ref 3.5–5.0)
Anion gap: 12 (ref 5–15)
BUN: 41 mg/dL — ABNORMAL HIGH (ref 8–23)
CO2: 22 mmol/L (ref 22–32)
Calcium: 8.3 mg/dL — ABNORMAL LOW (ref 8.9–10.3)
Chloride: 100 mmol/L (ref 98–111)
Creatinine, Ser: 5.91 mg/dL — ABNORMAL HIGH (ref 0.61–1.24)
GFR, Estimated: 10 mL/min — ABNORMAL LOW (ref >60)
Glucose, Bld: 91 mg/dL (ref 70–99)
Phosphorus: 6.5 mg/dL — ABNORMAL HIGH (ref 2.5–4.6)
Potassium: 5.1 mmol/L (ref 3.5–5.1)
Sodium: 134 mmol/L — ABNORMAL LOW (ref 135–145)

## 2021-09-17 MED ORDER — BORTEZOMIB CHEMO SQ INJECTION 3.5 MG (2.5MG/ML)
1.5000 mg/m2 | Freq: Once | INTRAMUSCULAR | Status: AC
  Administered 2021-09-17: 2.75 mg via SUBCUTANEOUS
  Filled 2021-09-17: qty 1.1

## 2021-09-17 MED ORDER — CYCLOPHOSPHAMIDE 25 MG PO CAPS
225.0000 mg/m2 | ORAL_CAPSULE | Freq: Once | ORAL | Status: AC
  Administered 2021-09-17: 425 mg via ORAL
  Filled 2021-09-17 (×2): qty 9
  Filled 2021-09-17: qty 17

## 2021-09-17 MED ORDER — CYCLOPHOSPHAMIDE 50 MG PO CAPS
225.0000 mg/m2 | ORAL_CAPSULE | ORAL | 0 refills | Status: DC
  Filled 2021-09-17 – 2021-09-20 (×2): qty 36, 28d supply, fill #0

## 2021-09-17 MED ORDER — DEXAMETHASONE 6 MG PO TABS
40.0000 mg | ORAL_TABLET | Freq: Once | ORAL | Status: AC
  Administered 2021-09-17: 40 mg via ORAL
  Filled 2021-09-17: qty 1

## 2021-09-17 NOTE — Progress Notes (Addendum)
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  She is undergoing dialysis 09/07/2021.  We are dialyzing on a as needed basis.  We are coordinating treatment with his chemotherapy.  Last dialysis was 09/07/2021 with 2.4 L removed.  He is making good urine with 1.9 L    09/15/2021    we discussed dialysis and/or palliative medicine and he wishes to proceed with HD if necessary.  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 MRI suggestive of cardiac involvement   Assessment/ Plan:   #Acute kidney injury, nonoliguric, with history of excessive NSAID's use/BC powders every day.  Peaked creatinine level of 6.95. [name]  He underwent IR guided kidney biopsy on 9/6 complicated by postbiopsy bleeding.  Renal biopsy: AL amyloidosis, lambda light chain composition (glomeruli, interstitium, arteries, and arterioles). Marked IF, 59% global glomerular sclerosis and marked arteriosclerosis.     He received a one time treatment of dialysis 9/16. However renal function continues to remain poor. Prognosis is poor, Patient is considering either palliative medicine or dialysis -> very stressed understandably but wants RRT if needed. UOP is very good. He is right arm dominant.    - May d/c from renal standpoint with close follow up with [facility]; likely long term dialysis but no urgent indications at this point. He has great UOP and no absolute indications for RRT.   Scheduled  f/u with [facility] with Dr. 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  Per primary service     #Pulm  edema appears to have resolved with good diuresis     #Anemia, appears to be relatively stable with a hemoglobin is unchanged   #History of nephrolithiasis: Followed by urology   #Metabolic acidosis: Appears to have resolved continue oral sodium bicarbonate at this point   #Mild hyperkalemia we will continue Lokelma as needed.   Subjective:   Great UOP, no hypotensive episodes but BP is on the lower side.   Objective:   BP 109/68   Pulse 84   Temp 98.1 F (36.7 C) (Oral)   Resp 19   Ht 5\' 10"  (1.778 m)   Wt 72.3 kg   SpO2 94%   BMI 22.87 kg/m   Intake/Output Summary (Last 24 hours) at 09/17/2021 0955 Last data filed at 09/17/2021 0400 Gross per 24 hour  Intake 1663.72 ml  Output 1595 ml  Net 68.72 ml   Weight change: 0 kg  Physical Exam: General: nad Heart: rrr Lungs: normal wob, cta bl Abdomen: Soft, nontender Extremities: No LE edema. Neurology: Alert, awake, following commands, no asterixis Vascular Access: Right IJ temporary HD catheter removed.  Imaging: No results found.  Labs: BMET Recent Labs  Lab 09/11/21 0251 09/12/21 0217 09/13/21 0425 09/14/21 0430 09/15/21 0144 09/16/21 0139 09/16/21 0737 09/17/21 0514  NA 131* 134* 135 135 132* 135 133* 134*  K 4.1 4.6 4.8 4.8 4.8 5.8* 5.3* 5.1  CL 98 101 101 102 100 101 101 100  CO2 22 22 23 24 22  21* 23 22  GLUCOSE 139* 96 96 99 95 91 83  91  BUN 39* 39* 39* 38* 39* 42* 39* 41*  CREATININE 4.90* 5.09* 5.09* 5.31* 5.37* 5.48* 5.69* 5.91*  CALCIUM 7.5* 7.9* 8.1* 8.3* 8.1* 8.2* 8.6* 8.3*  PHOS 3.3 3.6 4.5 5.0* 5.3* 5.9*  --  6.5*   CBC Recent Labs  Lab 09/14/21 0430 09/15/21 0144 09/16/21 0139 09/17/21 0514  WBC 12.2* 11.5* 11.0* 10.0  NEUTROABS 8.1* 7.2 7.2 6.6  HGB 9.3* 9.2* 9.3* 9.3*  HCT 29.2* 28.4* 29.9* 29.4*  MCV 85.1 83.5 85.7 85.0  PLT 434* 437* 458* 377    Medications:     acyclovir  200 mg Oral Q12H   bortezomib SQ  1.5 mg/m2 (Treatment Plan Recorded) Subcutaneous Once    cyclophosphamide  225 mg/m2 Oral Once   dexamethasone  40 mg Oral Once   feeding supplement (NEPRO CARB STEADY)  237 mL Oral TID BM   folic acid  2 mg Oral Daily   Gerhardt's butt cream   Topical BID   melatonin  5 mg Oral QHS   nicotine  14 mg Transdermal Q24H   pantoprazole  40 mg Oral BID AC   sodium bicarbonate  1,300 mg Oral TID   sodium zirconium cyclosilicate  10 g Oral TID   tamsulosin  0.4 mg Oral Daily      Otelia Santee, MD 09/17/2021, 9:55 AM

## 2021-09-17 NOTE — Telephone Encounter (Signed)
R/s 10/5 appts to 10/3 per sch msg. Called and left msg. Will receive printout upon discharge

## 2021-09-17 NOTE — Progress Notes (Signed)
[facility] PROGRESS NOTE    Progress Note  Caleb Simmons  NFA:[id number] DOB: 1951-03-06 DOA: 08/23/2021 PCP: Pcp, No     Brief Narrative:   Caleb Simmons is an 70 y.o. male past medical history of nephrolithiasis with no other medical problems, but he does not see a primary care doctor regularly comes into the ED complaining of weakness was found to be in acute kidney injury and severe normocytic anemia.  Seen by nephrology who recommended renal biopsy done on 08/28/2021 unfortunately developed post renal bleeding with clots requiring an insertion of a CBI he is status post 2 units of packed red blood cells but he continued to bleed so IR performed embolization on 08/30/2021, renal biopsy showed amyloidosis.  Oncology has been consulted.  Also found to have strep agalactiae bacteremia.  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 The plan is to continue to administer weekly. Oncology recommended acyclovir.  AKI (acute kidney injury) ([place]) secondary to amyloid disease/hyperkalemia: Despite good urine output, renal function continues to deteriorate. His potassium has improved he was given Lokelma is 5.1.  We will continue oral Lokelma.  Oncology has also been consulted for plasma cell myeloma and started on chemotherapy treatment on 09/10/2021 Renal had a discussion with the patient about his worsening renal function, renal  related that he has a poor prognosis and he offered the patient dialysis versus moving towards comfort care. The patient has decided to proceed with hemodialysis, will defer to renal port placement and HD schedule.  Sepsis secondary to strep recollected bacteremia: 2D echo was done that showed no vegetation, but it did show a small drop in his EF. ID was consulted who recommended IV antibiotics for 10 days for which she has completed course in-house.  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 The advanced heart failure team was consulted they recommended a cardiac MRI on 09/10/2021 was suggestive of cardiac amyloid, Cardiology recommended chemotherapy, oncology is on board. No diuretics needed at this time.  Cardiology has signed off.  Gross hematuria/acute urinary retention due to bleeding in the collecting duct of the right kidney post renal biopsy: Foley was discontinued 08/30/2021. He required multiple transfusions. Status post embolization on 08/30/2021. Further management per urology.  Anxiety: Continue Xanax aware that we will not continue Xanax postdischarge, continue trazodone. He is not interested in other medications like SSRI.  Tobacco abuse: Continue nicotine patch.   DVT prophylaxis: lovenxo Family Communication:noen Status is: Inpatient  Remains inpatient appropriate because:Hemodynamically unstable  Dispo: The patient is from: Home              Anticipated d/c is to: Home              Patient currently is not medically stable to d/c.   Difficult to place patient No   Code Status:     Code Status Orders  (  From admission, onward)           Start     Ordered   08/23/21 1752  Full code  Continuous        08/23/21 1751           Code Status History     This patient has a current code status  but no historical code status.         IV Access:   Peripheral IV   Procedures and diagnostic studies:   No results found.   Medical Consultants:   None.   Subjective:    Caleb Simmons continues to have nausea.  Objective:    Vitals:   09/16/21 1424 09/16/21 2028 09/17/21 0000 09/17/21 0436  BP: 117/76 116/79 109/82 109/68  Pulse: 95 82 91 84  Resp: 17 18 19 19   Temp: 97.8 F (36.6 C) 97.7 F (36.5 C) 98.2 F (36.8 C) 98.1 F (36.7 C)  TempSrc: Oral Oral Oral Oral  SpO2: 97% 98% 98% 94%  Weight:    72.3 kg  Height:       SpO2: 94 % O2 Flow Rate (L/min): 2 L/min   Intake/Output Summary (Last 24 hours) at 09/17/2021 0729 Last data filed at 09/17/2021 0400 Gross per 24 hour  Intake 1663.72 ml  Output 1945 ml  Net -281.28 ml    Filed Weights   09/15/21 0423 09/16/21 0442 09/17/21 0436  Weight: 72 kg 72.3 kg 72.3 kg    Exam: General exam: In no acute distress. Respiratory system: Good air movement and clear to auscultation. Cardiovascular system: S1 & S2 heard, RRR. No JVD. Gastrointestinal system: Abdomen is nondistended, soft and nontender.  Extremities: No pedal edema. Skin: No rashes, lesions or ulcers Psychiatry: Judgement and insight appear normal. Mood & affect appropriate.  Data Reviewed:    Labs: Basic Metabolic Panel: Recent Labs  Lab 09/13/21 0425 09/14/21 0430 09/15/21 0144 09/16/21 0139 09/16/21 0737 09/17/21 0514  NA 135 135 132* 135 133* 134*  K 4.8 4.8 4.8 5.8* 5.3* 5.1  CL 101 102 100 101 101 100  CO2 23 24 22  21* 23 22  GLUCOSE 96 99 95 91 83 91  BUN 39* 38* 39* 42* 39* 41*  CREATININE 5.09* 5.31* 5.37* 5.48* 5.69* 5.91*  CALCIUM 8.1* 8.3* 8.1* 8.2* 8.6* 8.3*  PHOS 4.5 5.0* 5.3* 5.9*  --  6.5*    GFR Estimated Creatinine Clearance: 11.7 mL/min (A) (by C-G formula based on SCr of 5.91 mg/dL (H)). Liver Function Tests: Recent Labs  Lab 09/13/21 0425 09/14/21 0430 09/15/21 0144 09/16/21 0139 09/17/21 0514   ALBUMIN 1.6* 1.6* 1.6* 1.6* 1.5*    No results for input(s): LIPASE, AMYLASE in the last 168 hours. No results for input(s): AMMONIA in the last 168 hours. Coagulation profile No results for input(s): INR, PROTIME in the last 168 hours. COVID-19 Labs  No results for input(s): DDIMER, FERRITIN, LDH, CRP in the last 72 hours.  Lab Results  Component Value Date   [place] NEGATIVE 08/23/2021    CBC: Recent Labs  Lab 09/13/21 0425 09/14/21 0430 09/15/21 0144 09/16/21 0139 09/17/21 0514  WBC 13.3* 12.2* 11.5* 11.0* 10.0  NEUTROABS 8.8* 8.1* 7.2 7.2 6.6  HGB 9.6* 9.3* 9.2* 9.3* 9.3*  HCT 30.1* 29.2* 28.4* 29.9* 29.4*  MCV 84.3 85.1 83.5 85.7 85.0  PLT 483* 434* 437* 458* 377    Cardiac Enzymes: No results for input(s): CKTOTAL, CKMB, CKMBINDEX, TROPONINI in the last 168 hours. BNP (last  3 results) No results for input(s): PROBNP in the last 8760 hours. CBG: No results for input(s): GLUCAP in the last 168 hours. D-Dimer: No results for input(s): DDIMER in the last 72 hours. Hgb A1c: No results for input(s): HGBA1C in the last 72 hours. Lipid Profile: No results for input(s): CHOL, HDL, LDLCALC, TRIG, CHOLHDL, LDLDIRECT in the last 72 hours. Thyroid function studies: No results for input(s): TSH, T4TOTAL, T3FREE, THYROIDAB in the last 72 hours.  Invalid input(s): FREET3 Anemia work up: No results for input(s): VITAMINB12, FOLATE, FERRITIN, TIBC, IRON, RETICCTPCT in the last 72 hours. Sepsis Labs: Recent Labs  Lab 09/14/21 0430 09/15/21 0144 09/16/21 0139 09/17/21 0514  WBC 12.2* 11.5* 11.0* 10.0    Microbiology Recent Results (from the past 240 hour(s))  Culture, blood (Routine X 2) w Reflex to ID Panel     Status: None   Collection Time: 09/08/21  5:30 AM   Specimen: BLOOD LEFT HAND  Result Value Ref Range Status   Specimen Description BLOOD LEFT HAND  Final   Special Requests AEROBIC BOTTLE ONLY Blood Culture adequate volume  Final   Culture   Final     NO GROWTH 5 DAYS Performed at [facility], [address], [place], [place] [address]    Report Status 09/13/2021 FINAL  Final  Culture, blood (routine x 2)     Status: None   Collection Time: 09/08/21  5:31 AM   Specimen: BLOOD RIGHT HAND  Result Value Ref Range Status   Specimen Description BLOOD RIGHT HAND  Final   Special Requests AEROBIC BOTTLE ONLY Blood Culture adequate volume  Final   Culture   Final    NO GROWTH 5 DAYS Performed at [facility], [place] [address], [place], [place] [address]    Report Status 09/13/2021 FINAL  Final     Medications:    acyclovir  200 mg Oral Q12H   feeding supplement (NEPRO CARB STEADY)  237 mL Oral TID BM   folic acid  2 mg Oral Daily   Gerhardt's butt cream   Topical BID   melatonin  5 mg Oral QHS   nicotine  14 mg Transdermal Q24H   pantoprazole  40 mg Oral BID AC   sodium bicarbonate  1,300 mg Oral TID   sodium zirconium cyclosilicate  10 g Oral TID   tamsulosin  0.4 mg Oral Daily   Continuous Infusions:  sodium chloride     sodium chloride 10 mL/hr at 09/17/21 0000   sodium chloride irrigation        LOS: 25 days   Charlynne Cousins  [facility]  09/17/2021, 7:29 AM

## 2021-09-17 NOTE — Progress Notes (Signed)
Patient received oral cyclophosphamide and sq bortezomib and tolerated well. Caleb Simmons [facility] 09/17/2021

## 2021-09-17 NOTE — Progress Notes (Addendum)
HEMATOLOGY-ONCOLOGY PROGRESS NOTE  SUBJECTIVE: Mr. Lewin Intermittent nausea.  No vomiting reported. Antiemetics effective.  He has been ambulating with physical therapy.  Nephrology has discussed proceeding with hemodialysis.  The patient does agree to proceed and will have port placement tomorrow.  REVIEW OF SYSTEMS:   Constitutional: Denies fevers, chills  Eyes: Denies blurriness of vision Ears, nose, mouth, throat, and face: Denies mucositis or sore throat Respiratory: Denies cough, dyspnea or wheezes Cardiovascular: Denies palpitation, chest discomfort Gastrointestinal: Reports nausea, denies vomiting Skin: Denies abnormal skin rashes Lymphatics: Denies new lymphadenopathy or easy bruising Neurological:Denies numbness, tingling or new weaknesses Behavioral/Psych: Mood is stable, no new changes  Extremities: No lower extremity edema All other systems were reviewed with the patient and are negative.  I have reviewed the past medical history, past surgical history, social history and family history with the patient and they are unchanged from previous note.   PHYSICAL EXAMINATION: ECOG PERFORMANCE STATUS: 2 - Symptomatic, <50% confined to bed  Vitals:   09/17/21 0000 09/17/21 0436  BP: 109/82 109/68  Pulse: 91 84  Resp: 19 19  Temp: 98.2 F (36.8 C) 98.1 F (36.7 C)  SpO2: 98% 94%   Filed Weights   09/15/21 0423 09/16/21 0442 09/17/21 0436  Weight: 72 kg 72.3 kg 72.3 kg    Intake/Output from previous day: 09/25 0701 - 09/26 0700 In: 1663.7 [P.O.:360; I.V.:1303.7] Out: 1945 [Urine:1945]  GENERAL:alert, no distress and comfortable SKIN: skin color, texture, turgor are normal, no rashes or significant lesions EYES: normal, Conjunctiva are pink and non-injected, sclera clear OROPHARYNX:no exudate, no erythema and lips, buccal mucosa, and tongue normal  LUNGS: clear to auscultation and percussion with normal breathing effort HEART: regular rate & rhythm and no murmurs  and no lower extremity edema ABDOMEN:abdomen soft, non-tender and normal bowel sounds NEURO: alert & oriented x 3 with fluent speech, no focal motor/sensory deficits  LABORATORY DATA:  I have reviewed the data as listed CMP Latest Ref Rng & Units 09/17/2021 09/16/2021 09/16/2021  Glucose 70 - 99 mg/dL 91 83 91  BUN 8 - 23 mg/dL 41(H) 39(H) 42(H)  Creatinine 0.61 - 1.24 mg/dL 5.91(H) 5.69(H) 5.48(H)  Sodium 135 - 145 mmol/L 134(L) 133(L) 135  Potassium 3.5 - 5.1 mmol/L 5.1 5.3(H) 5.8(H)  Chloride 98 - 111 mmol/L 100 101 101  CO2 22 - 32 mmol/L 22 23 21(L)  Calcium 8.9 - 10.3 mg/dL 8.3(L) 8.6(L) 8.2(L)  Total Protein 6.5 - 8.1 g/dL - - -  Total Bilirubin 0.3 - 1.2 mg/dL - - -  Alkaline Phos 38 - 126 U/L - - -  AST 15 - 41 U/L - - -  ALT 0 - 44 U/L - - -    Lab Results  Component Value Date   WBC 10.0 09/17/2021   HGB 9.3 (L) 09/17/2021   HCT 29.4 (L) 09/17/2021   MCV 85.0 09/17/2021   PLT 377 09/17/2021   NEUTROABS 6.6 09/17/2021    CT ABDOMEN PELVIS WO CONTRAST  Result Date: 08/30/2021 CLINICAL DATA:  Retroperitoneal hematoma, follow up s/p random renal bx with perinephric hematoma development EXAM: CT ABDOMEN AND PELVIS WITHOUT CONTRAST TECHNIQUE: Multidetector CT imaging of the abdomen and pelvis was performed following the standard protocol without IV contrast. COMPARISON:  September 6 FINDINGS: Inferior chest: Trace bilateral pleural effusions with right greater than left compressive subsegmental atelectasis. Hepatobiliary: The liver is normal in size without focal abnormality. No intrahepatic or extrahepatic biliary ductal dilation. The gallbladder appears normal. Spleen: Normal in  size without focal abnormality. Pancreas: No pancreatic ductal dilatation or surrounding inflammatory changes. Adrenals/Urinary Tract: Adrenal glands are unremarkable. The kidneys are normal in size. Tiny perinephric hematoma along the right renal lower pole is unchanged. Hyperdense material in the right  collecting system and bladder consistent with blood products, grossly similar. Stomach/Bowel: The stomach, small bowel and large bowel are normal in caliber without abnormal wall thickening or surrounding inflammatory changes. Reproductive: Prostate is unremarkable. Lymphatic: No enlarged lymph nodes in the abdomen or pelvis. Vasculature: The abdominal aorta is normal in caliber. Aortic atherosclerosis. Other: No abdominopelvic ascites. Musculoskeletal: No aggressive osseous lesions. Degenerative changes at L5-S1. Bone island in the left ilium. IMPRESSION: The small perinephric hematoma along the right renal lower pole is stable, and within expected limits after percutaneous biopsy. However, there remains substantial clot burden within the bladder. Follow-up urology recommendations for management. Electronically Signed   By: Albin Felling M.D.   On: 08/30/2021 14:21   CT ABDOMEN PELVIS WO CONTRAST  Result Date: 08/29/2021 CLINICAL DATA:  Post right-sided renal biopsy, now with hematuria and hypotension. EXAM: CT ABDOMEN AND PELVIS WITHOUT CONTRAST TECHNIQUE: Multidetector CT imaging of the abdomen and pelvis was performed following the standard protocol without IV contrast. COMPARISON:  CT abdomen pelvis-08/23/2021; ultrasound-guided right renal biopsy-earlier same day FINDINGS: The lack of intravenous contrast limits the ability to evaluate solid abdominal organs. Lower chest: Limited visualization of the lower thorax demonstrates interval development of trace bilateral effusion with worsening bibasilar heterogeneous/consolidative opacities, right greater than left. Previously identified nonspecific ground-glass opacities within the imaged lung bases is not seen on the present examination though there is mild residual intraseptal thickening. Normal heart size. Trace amount of pericardial fluid, unchanged presumably physiologic. There is diffuse decreased attenuation intra cardiac blood pool suggestive of  anemia. Hepatobiliary: Normal hepatic contour. Apparent high density material within the gallbladder could represent biliary sludge. No definitive gallbladder wall thickening or pericholecystic stranding on this noncontrast examination. No ascites. Pancreas: Normal noncontrast appearance of the pancreas. Spleen: Normal noncontrast appearance of the spleen. Adrenals/Urinary Tract: There is a very tiny (approximately 2.7 x 1.6 x 1.2 cm) perinephric hematoma about the inferior pole of the right kidney (axial image 44, series 3; coronal image 57, series 6), with minimal amount of adjacent perinephric stranding. High-density material is seen within the right renal collecting system and ureter with moderate to large amount of layering high-density material within urinary bladder, findings compatible with hemorrhage into the collecting system and bladder. Mild associated right-sided pelviectasis and ureterectasis. Normal noncontrast appearance of the left kidney. No evidence of left-sided nephrolithiasis or urinary obstruction. Normal noncontrast appearance of the bilateral adrenal glands. Stomach/Bowel: Scattered minimal colonic diverticulosis without evidence of superimposed acute diverticulitis on this noncontrast examination. Normal appearance of the terminal ileum. The appendix is not visualized compatible with provided operative history. No discrete areas of bowel wall thickening on this noncontrast examination. No pneumoperitoneum, pneumatosis or portal venous gas. Vascular/Lymphatic: Moderate amount of atherosclerotic plaque within normal caliber abdominal aorta. Scattered retroperitoneal lymph nodes are numerous though individually not enlarged by size criteria with index left sided periaortic lymph node measuring 0.7 cm in greatest short axis diameter (image 31, series 3), presumably reactive in etiology. No bulky retroperitoneal, mesenteric, pelvic or inguinal lymphadenopathy on this noncontrast examination  Reproductive: Dystrophic calcifications within normal sized prostate gland. Trace amount of fluid within the pelvic cul-de-sac. Other: Small bilateral mesenteric fat containing inguinal hernias, left greater than right. Minimal amount of subcutaneous edema about the midline  of the low back. Presumed shrapnel is seen within the right lower abdominal/pelvic ventral abdominal wall. Musculoskeletal: No acute or aggressive osseous abnormalities. Mild-to-moderate multilevel lumbar spine DDD, worse at L4-L5 and L5-S1 with disc space height loss, endplate irregularity and small posteriorly directed disc osteophyte complexes at these locations. Mild degenerative change the bilateral hips with joint space loss, subchondral sclerosis and osteophytosis, right greater than left. IMPRESSION: 1. Post right-sided renal biopsy complicated by tiny (approximately 2.7 cm) perinephric hematoma and bleeding into the right renal collecting system including moderate to large-sized clot within the urinary bladder and associated mild right-sided pelviectasis and ureterectasis. Consideration for initiation of continuous bladder irrigation could be performed as indicated. 2. Trace bilateral effusions with associated bibasilar opacities, right greater than left, likely atelectasis. 3. Colonic diverticulosis without evidence superimposed acute diverticulitis. 4.  Aortic Atherosclerosis (ICD10-I70.0). Critical Value/emergent results were called by telephone at the time of interpretation on 08/28/2021 at 5:04 pm to provider [facility] , who verbally acknowledged these results. Electronically Signed   By: Sandi Mariscal M.D.   On: 08/29/2021 10:38   DG Chest 2 View  Result Date: 09/06/2021 CLINICAL DATA:  Fever EXAM: CHEST - 2 VIEW COMPARISON:  09/03/2021 FINDINGS: Right IJ dialysis catheter tip med right atrium. Midline trachea. Normal heart size. Left costophrenic angle minimally excluded the frontal radiograph. Small bilateral pleural  effusions. No pneumothorax. Improved interstitial edema with minimal pulmonary venous congestion remaining. Persistent mild bibasilar atelectasis. IMPRESSION: Improved interstitial edema with mild pulmonary venous congestion remaining. Small bilateral pleural effusions with adjacent atelectasis. Electronically Signed   By: Abigail Miyamoto M.D.   On: 09/06/2021 13:49   DG Chest 2 View  Result Date: 08/23/2021 CLINICAL DATA:  Near syncope. EXAM: CHEST - 2 VIEW COMPARISON:  None. FINDINGS: The lungs are clear without focal pneumonia, edema, pneumothorax or pleural effusion. Cardiopericardial silhouette is at upper limits of normal for size. The visualized bony structures of the thorax show no acute abnormality. Telemetry leads overlie the chest. IMPRESSION: No active cardiopulmonary disease. Electronically Signed   By: Misty Stanley M.D.   On: 08/23/2021 09:15   NM Pulmonary Perfusion  Result Date: 08/23/2021 CLINICAL DATA:  PE suspected, shortness of breath EXAM: NUCLEAR MEDICINE PERFUSION LUNG SCAN TECHNIQUE: Perfusion images were obtained in multiple projections after intravenous injection of radiopharmaceutical. Ventilation scans intentionally deferred if perfusion scan and chest x-ray adequate for interpretation during COVID 19 epidemic. RADIOPHARMACEUTICALS:  4.2 mCi Tc-62mMAA IV COMPARISON:  Same-day chest radiographs FINDINGS: Normal, homogeneous pulmonary perfusion. No suspicious filling defects. IMPRESSION: Very low probability examination for pulmonary embolism by modified perfusion only PIOPED criteria (PE absent). Electronically Signed   By: AEddie CandleM.D.   On: 08/23/2021 15:59   IR Angiogram Renal Left Selective  INDICATION: 70year old male with history of acute kidney injury of uncertain etiology status post ultrasound-guided right renal biopsy on 08/28/2021. Since biopsy, the patient has experienced gross hematuria with associated acute anemia.   EXAM: 1. Ultrasound-guided vascular access  of the right internal jugular vein. 2. Temporary hemodialysis catheter placement. 3. Ultrasound-guided vascular access of the right common femoral artery. 4. Selective catheterization and angiography of the right renal artery. 5. Sub selective catheterization angiography of right inferior polar and arcuate artery branches. 6. Coil embolization of right inferior pole arcuate artery branch.   MEDICATIONS: None.   ANESTHESIA/SEDATION: Moderate (conscious) sedation was employed during this procedure. A total of Versed 3 mg and Fentanyl 50 mcg was administered intravenously.   Moderate Sedation  Time: 66 minutes. The patient's level of consciousness and vital signs were monitored continuously by radiology nursing throughout the procedure under my direct supervision.   CONTRAST:  66m OMNIPAQUE IOHEXOL 350 MG/ML SOLN, 558mOMNIPAQUE IOHEXOL 350 MG/ML SOLN   FLUOROSCOPY TIME:  Fluoroscopy Time: 10.6 minutes, (811 mGy).   COMPLICATIONS: None immediate.   PROCEDURE: Informed consent was obtained from the patient following explanation of the procedure, risks, benefits and alternatives. The patient understands, agrees and consents for the procedure. All questions were addressed. A time out was performed prior to the initiation of the procedure. Maximal barrier sterile technique utilized including caps, mask, sterile gowns, sterile gloves, large sterile drape, hand hygiene, and chlorhexidine prep.   Preprocedure ultrasound evaluation of the right internal jugular vein demonstrated a patent and compressible vein free of internal echoes. Procedure was planned. Subdermal Local anesthesia was provided 1% lidocaine. A small skin nick was made. Under direct ultrasound visualization, a 21 gauge micropuncture needle was directed into the internal jugular vein. An image was captured and stored in the permanent record. A micropuncture set was inserted and exchanged for a J wire which was positioned in the inferior vena cava. Serial  dilation was performed followed by placement of a 12.5 French, 24 cm Trialysis catheter. The catheter tip was positioned in the right atrium. Each lumen flushed and aspirated appropriately. The dialysis ports were locked with appropriate volume of heparin dwell. The middle central venous port was then used for sedation for the remainder of the procedure. The catheter was secured with a 0 silk retention suture. A sterile bandage was applied.   Preprocedure ultrasound evaluation of the right groin was performed which demonstrated a patent right common femoral artery. The procedure was planned. Subdermal Local anesthesia was provided with 1% lidocaine. A small skin nick was made. Under direct ultrasound visualization, a 21 gauge micropuncture needle was directed into the common femoral artery. An ultrasound image was captured and stored in the permanent record. A micro puncture set was inserted and a limited right lower extremity angiogram was performed which demonstrated appropriate puncture site for closure device use. A J wire was directed to the abdominal aorta and the micropuncture set was exchanged for a 5 Fr[place] sheath. A 5 French C2 catheter was then directed into the right renal ostium. Right renal angiogram was performed. The single main renal artery was patent. 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   A straight lantern microcatheter and 0.014" soft synchro wire was then inserted and directed into the inferior polar branch. Repeat angiogram was performed in the sub selective location which was significant for multifocal 2-4 mm pseudoaneurysm formation, abnormal truncation and irregularity of the arcuate and interlobular branches, an early arteriovenous shunting. The inferior polar arcuate  branch was selected further. Coil embolization was performed with multiple low profile Penumbra Ruby coils ranging from 2-3 mm in diameter. Completion right inferior renal angiogram was performed which demonstrated appropriate embolization of the targeted vessels without persistent arteriovenous fistula or vessel irregularity.   The catheters were removed. The right common femoral artery was then closed with a 6 FrPakistanngio-Seal device. Distal pulses were unchanged. The patient tolerated the procedure well was transferred back to the floor in good condition.   IMPRESSION: 1. 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 2. Sub selective coil embolization of right inferior polar arcuate artery branch. 3. Successful placement of right internal jugular, 24 French Trialysis catheter with the catheter tip in the right atrium. The catheter is ready for immediate use.   Ruthann Cancer, MD   [facility]   [facility]     Electronically Signed   By: Ruthann Cancer M.D.   On: 08/31/2021 09:29    IR Fluoro Guide CV Line Right  Result Date: 08/31/2021 INDICATION: 70 year old male with history of acute kidney injury of uncertain etiology status post ultrasound-guided right renal biopsy on 08/28/2021. Since biopsy, the patient has experienced gross hematuria with associated acute anemia. EXAM: 1. Ultrasound-guided vascular access of the right internal jugular vein. 2. Temporary hemodialysis catheter placement. 3. Ultrasound-guided vascular access of the right common femoral artery. 4. Selective catheterization and angiography of the right renal artery. 5. Sub selective catheterization angiography of right inferior polar and arcuate artery branches. 6. Coil embolization of right inferior pole arcuate artery branch. MEDICATIONS: None.  ANESTHESIA/SEDATION: Moderate (conscious) sedation was employed during this procedure. A total of Versed 3 mg and Fentanyl 50 mcg was administered intravenously. Moderate Sedation Time: 66 minutes. The patient's level of consciousness and vital signs were monitored continuously by radiology nursing throughout the procedure under my direct supervision. CONTRAST:  3m OMNIPAQUE IOHEXOL 350 MG/ML SOLN, 514mOMNIPAQUE IOHEXOL 350 MG/ML SOLN FLUOROSCOPY TIME:  Fluoroscopy Time: 10.6 minutes, (811 mGy). COMPLICATIONS: None immediate. PROCEDURE: Informed consent was obtained from the patient following explanation of the procedure, risks, benefits and alternatives. The patient understands, agrees and consents for the procedure. All questions were addressed. A time out was performed prior to the initiation of the procedure. Maximal barrier sterile technique utilized including caps, mask, sterile gowns, sterile gloves, large sterile drape, hand hygiene, and chlorhexidine prep. Preprocedure ultrasound evaluation of the right internal jugular vein demonstrated a patent and compressible vein free of internal echoes. Procedure was planned. Subdermal Local anesthesia was provided 1% lidocaine. A small skin nick was made. Under direct ultrasound visualization, a 21 gauge micropuncture needle was directed into the internal jugular vein. An image was captured and stored in the permanent record. A micropuncture set was inserted and exchanged for a J wire which was positioned in the inferior vena cava. Serial dilation was performed followed by placement of a 12.5 French, 24 cm Trialysis catheter. The catheter tip was positioned in the right atrium. Each lumen flushed and aspirated appropriately. The dialysis ports were locked with appropriate volume of heparin dwell. The middle central venous port was then used for sedation for the remainder of the procedure. The catheter was secured with a 0 silk retention suture. A sterile bandage  was applied. Preprocedure ultrasound evaluation of the right groin was performed which demonstrated a patent right common femoral artery. The procedure was planned. Subdermal Local anesthesia was provided with 1% lidocaine. A small skin nick was made. Under direct ultrasound visualization, a 21 gauge micropuncture needle was directed into the common femoral artery. An ultrasound image was captured and stored in the permanent record. A micro puncture set was inserted and a limited right lower extremity angiogram was performed which demonstrated appropriate puncture site for closure device use. A J wire was directed to the abdominal aorta and the micropuncture set was exchanged for a 5 Fr[place]ascular sheath. A 5 French C2 catheter was then directed into  the right renal ostium. Right renal angiogram was performed. The single main renal artery was patent. 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 A straight lantern microcatheter and 0.014" soft synchro wire was then inserted and directed into the inferior polar branch. Repeat angiogram was performed in the sub selective location which was significant for multifocal 2-4 mm pseudoaneurysm formation, abnormal truncation and irregularity of the arcuate and interlobular branches, an early arteriovenous shunting. The inferior polar arcuate branch was selected further. Coil embolization was performed with multiple low profile Penumbra Ruby coils ranging from 2-3 mm in diameter. Completion right inferior renal angiogram was performed which demonstrated appropriate embolization of the targeted vessels without persistent arteriovenous fistula or vessel irregularity. The catheters were removed. The right common femoral artery was then closed with a 6 [place] Angio-Seal  device. Distal pulses were unchanged. The patient tolerated the procedure well was transferred back to the floor in good condition. IMPRESSION: 1. 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 2. Sub selective coil embolization of right inferior polar arcuate artery branch. 3. Successful placement of right internal jugular, 24 French Trialysis catheter with the catheter tip in the right atrium. The catheter is ready for immediate use. Ruthann Cancer, MD [facility] Electronically Signed   By: Ruthann Cancer M.D.   On: 08/31/2021 09:29   IR US Guide Vasc Access Right  Result Date: 08/31/2021 INDICATION: 70 year old male with history of acute kidney injury of uncertain etiology status post ultrasound-guided right renal biopsy on 08/28/2021. Since biopsy, the patient has experienced gross hematuria with associated acute anemia. EXAM: 1. Ultrasound-guided vascular access of the right internal jugular vein. 2. Temporary hemodialysis catheter placement. 3. Ultrasound-guided vascular access of the right common femoral artery. 4. Selective catheterization and angiography of the right renal artery. 5. Sub selective catheterization angiography of right inferior polar and arcuate artery branches. 6. Coil embolization of right inferior pole arcuate artery branch. MEDICATIONS: None. ANESTHESIA/SEDATION: Moderate (conscious) sedation was employed during this procedure. A total of Versed 3 mg and Fentanyl 50 mcg was administered intravenously. Moderate Sedation Time: 66 minutes. The patient's level of consciousness and vital signs were monitored continuously by radiology nursing throughout the procedure under my direct supervision. CONTRAST:  2m OMNIPAQUE IOHEXOL 350 MG/ML SOLN, 565mOMNIPAQUE IOHEXOL 350 MG/ML SOLN FLUOROSCOPY  TIME:  Fluoroscopy Time: 10.6 minutes, (811 mGy). COMPLICATIONS: None immediate. PROCEDURE: Informed consent was obtained from the patient following explanation of the procedure, risks, benefits and alternatives. The patient understands, agrees and consents for the procedure. All questions were addressed. A time out was performed prior to the initiation of the procedure. Maximal barrier sterile technique utilized including caps, mask, sterile gowns, sterile gloves, large sterile drape, hand hygiene, and chlorhexidine prep. Preprocedure ultrasound evaluation of the right internal jugular vein demonstrated a patent and compressible vein free of internal echoes. Procedure was planned. Subdermal Local anesthesia was provided 1% lidocaine. A small skin nick was made. Under direct ultrasound visualization, a 21 gauge micropuncture needle was directed into the internal jugular vein. An image was captured and stored in the permanent record. A micropuncture set was inserted and exchanged for a J wire which was positioned in the inferior vena  cava. Serial dilation was performed followed by placement of a 12.5 French, 24 cm Trialysis catheter. The catheter tip was positioned in the right atrium. Each lumen flushed and aspirated appropriately. The dialysis ports were locked with appropriate volume of heparin dwell. The middle central venous port was then used for sedation for the remainder of the procedure. The catheter was secured with a 0 silk retention suture. A sterile bandage was applied. Preprocedure ultrasound evaluation of the right groin was performed which demonstrated a patent right common femoral artery. The procedure was planned. Subdermal Local anesthesia was provided with 1% lidocaine. A small skin nick was made. Under direct ultrasound visualization, a 21 gauge micropuncture needle was directed into the common femoral artery. An ultrasound image was captured and stored in the permanent record. A micro puncture  set was inserted and a limited right lower extremity angiogram was performed which demonstrated appropriate puncture site for closure device use. A J wire was directed to the abdominal aorta and the micropuncture set was exchanged for a 5 [place] vascular sheath. A 5 French C2 catheter was then directed into the right renal ostium. Right renal angiogram was performed. The single main renal artery was patent. 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 A straight lantern microcatheter and 0.014" soft synchro wire was then inserted and directed into the inferior polar branch. Repeat angiogram was performed in the sub selective location which was significant for multifocal 2-4 mm pseudoaneurysm formation, abnormal truncation and irregularity of the arcuate and interlobular branches, an early arteriovenous shunting. The inferior polar arcuate branch was selected further. Coil embolization was performed with multiple low profile Penumbra Ruby coils ranging from 2-3 mm in diameter. Completion right inferior renal angiogram was performed which demonstrated appropriate embolization of the targeted vessels without persistent arteriovenous fistula or vessel irregularity. The catheters were removed. The right common femoral artery was then closed with a 6 [place] Angio-Seal device. Distal pulses were unchanged. The patient tolerated the procedure well was transferred back to the floor in good condition. IMPRESSION: 1. 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 2. Sub selective coil embolization of right inferior polar arcuate artery branch. 3. Successful placement of right  internal jugular, 24 French Trialysis catheter with the catheter tip in the right atrium. The catheter is ready for immediate use. Ruthann Cancer, MD [facility] Radiology Electronically Signed   By: Ruthann Cancer M.D.   On: 08/31/2021 09:29   IR US Guide Vasc Access Right  Result Date: 08/31/2021 INDICATION: 70 year old male with history of acute kidney injury of uncertain etiology status post ultrasound-guided right renal biopsy on 08/28/2021. Since biopsy, the patient has experienced gross hematuria with associated acute anemia. EXAM: 1. Ultrasound-guided vascular access of the right internal jugular vein. 2. Temporary hemodialysis catheter placement. 3. Ultrasound-guided vascular access of the right common femoral artery. 4. Selective catheterization and angiography of the right renal artery. 5. Sub selective catheterization angiography of right inferior polar and arcuate artery branches. 6. Coil embolization of right inferior pole arcuate artery branch. MEDICATIONS: None. ANESTHESIA/SEDATION: Moderate (conscious) sedation was employed during this procedure. A total of Versed 3 mg and Fentanyl 50 mcg was administered intravenously. Moderate Sedation Time: 31  minutes. The patient's level of consciousness and vital signs were monitored continuously by radiology nursing throughout the procedure under my direct supervision. CONTRAST:  16m OMNIPAQUE IOHEXOL 350 MG/ML SOLN, 523mOMNIPAQUE IOHEXOL 350 MG/ML SOLN FLUOROSCOPY TIME:  Fluoroscopy Time: 10.6 minutes, (811 mGy). COMPLICATIONS: None immediate. PROCEDURE: Informed consent was obtained from the patient following explanation of the procedure, risks, benefits and alternatives. The patient understands, agrees and consents for the procedure. All questions were addressed. A time out was performed prior to the initiation of the procedure. Maximal barrier sterile technique utilized including caps, mask, sterile gowns,  sterile gloves, large sterile drape, hand hygiene, and chlorhexidine prep. Preprocedure ultrasound evaluation of the right internal jugular vein demonstrated a patent and compressible vein free of internal echoes. Procedure was planned. Subdermal Local anesthesia was provided 1% lidocaine. A small skin nick was made. Under direct ultrasound visualization, a 21 gauge micropuncture needle was directed into the internal jugular vein. An image was captured and stored in the permanent record. A micropuncture set was inserted and exchanged for a J wire which was positioned in the inferior vena cava. Serial dilation was performed followed by placement of a 12.5 French, 24 cm Trialysis catheter. The catheter tip was positioned in the right atrium. Each lumen flushed and aspirated appropriately. The dialysis ports were locked with appropriate volume of heparin dwell. The middle central venous port was then used for sedation for the remainder of the procedure. The catheter was secured with a 0 silk retention suture. A sterile bandage was applied. Preprocedure ultrasound evaluation of the right groin was performed which demonstrated a patent right common femoral artery. The procedure was planned. Subdermal Local anesthesia was provided with 1% lidocaine. A small skin nick was made. Under direct ultrasound visualization, a 21 gauge micropuncture needle was directed into the common femoral artery. An ultrasound image was captured and stored in the permanent record. A micro puncture set was inserted and a limited right lower extremity angiogram was performed which demonstrated appropriate puncture site for closure device use. A J wire was directed to the abdominal aorta and the micropuncture set was exchanged for a 5 [place] sheath. A 5 French C2 catheter was then directed into the right renal ostium. Right renal angiogram was performed. The single main renal artery was patent. About 2 arcuate artery branches in the  inferior pole there is abnormal truncation and vessel irregularity with evidence of an early filling arteriovenous fistula in addition to suggestion of faint filling into the collecting system on delayed imaging. A straight lantern microcatheter and 0.014" soft synchro wire was then inserted and directed into the inferior polar branch. Repeat angiogram was performed in the sub selective location which was significant for multifocal 2-4 mm pseudoaneurysm formation, abnormal truncation and irregularity of the arcuate and interlobular branches, an early arteriovenous shunting. The inferior polar arcuate branch was selected further. Coil embolization was performed with multiple low profile Penumbra Ruby coils ranging from 2-3 mm in diameter. Completion right inferior renal angiogram was performed which demonstrated appropriate embolization of the targeted vessels without persistent arteriovenous fistula or vessel irregularity. The catheters were removed. The right common femoral artery was then closed with a 6 [place]-Seal device. Distal pulses were unchanged. The patient tolerated the procedure well was transferred back to the floor in good condition. IMPRESSION: 1. 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 2. Sub selective coil  embolization of right inferior polar arcuate artery branch. 3. Successful placement of right internal jugular, 24 French Trialysis catheter with the catheter tip in the right atrium. The catheter is ready for immediate use. Ruthann Cancer, MD [facility] Radiology Electronically Signed   By: Ruthann Cancer M.D.   On: 08/31/2021 09:29   DG Chest Port 1 View  Result Date: 09/03/2021 CLINICAL DATA:  Shortness of breath EXAM: PORTABLE CHEST 1 VIEW COMPARISON:  08/23/2021 FINDINGS: Right dialysis  catheter in place with the tip in the right atrium. Heart is normal size. Mild vascular congestion and interstitial prominence throughout the lungs, likely mild edema. No effusions or acute bony abnormality. IMPRESSION: Suspect mild pulmonary edema. Electronically Signed   By: Rolm Baptise M.D.   On: 09/03/2021 09:40   DG Bone Survey Met  Result Date: 09/08/2021 CLINICAL DATA:  Left hip pain. EXAM: METASTATIC BONE SURVEY COMPARISON:  None. FINDINGS: No metastatic lesions identified. No cause for left hip pain identified. IMPRESSION: No acute abnormalities identified. No metastatic lesions. No cause for hip pain noted. Electronically Signed   By: Dorise Bullion III M.D.   On: 09/08/2021 14:32   MR CARDIAC MORPHOLOGY WO CONTRAST  Result Date: 09/10/2021 CLINICAL DATA:  Concern for cardiac amyloidosis. EXAM: CARDIAC MRI TECHNIQUE: The patient was scanned on a 1.5 Tesla GE magnet. A dedicated cardiac coil was used. Functional imaging was done using Fiesta sequences. 2,3, and 4 chamber views were done to assess for RWMA's. Modified Simpson's rule using a short axis stack was used to calculate an ejection fraction on a dedicated work Conservation officer, nature. T1 and T2 sequences done. No contrast due to AKI and unstable renal function. CONTRAST:  None FINDINGS: Limited images of the lung fields showed small bilateral pleural effusions. Small inferior pericardial effusion. Normal left ventricular size with moderate LV hypertrophy. Moderate diffuse hypokinesis with EF 38%. Normal right ventricular size with mild-moderate dysfunction, EF 36%. The aortic valve is trileaflet with no significant stenosis or regurgitation. Normal right atrial size. Mild left atrial enlargement. No significant mitral regurgitation. Measurements: T1 1217 T2 53 LVEDV 134 mL LVSV 51 mL LVEF 38% RVEDV 98 mL RVSV 35 mL RVEF 36% IMPRESSION: 1. Normal LV size with moderate LV hypertrophy. EF 38% with diffuse hypokinesis. 2.  Normal RV size  with EF 36%. 3.  T1 elevated, T2 borderline elevated. 4.  Small inferior pericardial effusion. 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 Dalton Mclean Electronically Signed   By: Loralie Champagne M.D.   On: 09/10/2021 17:55   CT BONE MARROW BIOPSY & ASPIRATION  Result Date: 09/05/2021 CLINICAL DATA:  Amyloid kidney EXAM: CT GUIDED DEEP ILIAC BONE ASPIRATION AND CORE BIOPSY TECHNIQUE: Patient was placed prone on the CT gantry and limited axial scans through the pelvis were obtained. Appropriate skin entry site was identified. Skin site was marked, prepped with chlorhexidine, draped in usual sterile fashion, and infiltrated locally with 1% lidocaine. Intravenous Fentanyl 51mg and Versed 256mwere administered as conscious sedation during continuous monitoring of the patient's level of consciousness and physiological / cardiorespiratory status by the radiology RN, with a total moderate sedation time of 10 minutes. Under CT fluoroscopic guidance an 11-gauge Cook trocar bone needle was advanced into the right iliac bone just lateral to the sacroiliac joint. Once needle tip position was confirmed, core and aspiration samples were  obtained, submitted to pathology for approval. Post procedure scans show no hematoma or fracture. Patient tolerated procedure well. COMPLICATIONS: COMPLICATIONS none IMPRESSION: 1. Technically successful CT guided right iliac bone core and aspiration biopsy. Electronically Signed   By: Lucrezia Europe M.D.   On: 09/05/2021 10:48   ECHOCARDIOGRAM COMPLETE  Result Date: 09/08/2021    ECHOCARDIOGRAM REPORT   Patient Name:   MAK BONNY Date of Exam: 09/08/2021 Medical Rec #:  [id number]    Height:       70.0 in Accession #:    [id number]   Weight:       156.5 lb Date of Birth:  1951-04-11     BSA:          1.881 m Patient Age:    70 years     BP:           130/78 mmHg  Patient Gender: M            HR:           75 bpm. Exam Location:  Inpatient Procedure: 2D Echo, Cardiac Doppler and Color Doppler Indications:    Bacteremia R78.81  History:        Patient has prior history of Echocardiogram examinations, most                 recent 08/24/2021. Risk Factors:Current Smoker. Generalized                 weakness/shortness of breath-found to have AKI and severe                 normocytic anemia.  Sonographer:    Alvino Chapel RCS Referring Phys: [id number] [place]  1. Left ventricular ejection fraction, by estimation, is 45 to 50%. The left ventricle has mildly decreased function. The left ventricle demonstrates global hypokinesis. There is moderate concentric left ventricular hypertrophy. Left ventricular diastolic parameters are consistent with Grade II diastolic dysfunction (pseudonormalization).  2. Right ventricular systolic function is normal. The right ventricular size is normal. Tricuspid regurgitation signal is inadequate for assessing PA pressure.  3. Left atrial size was moderately dilated.  4. A small to moderate pericardial effusion is present. The pericardial effusion is anterior to the right ventricle. There is no evidence of cardiac tamponade.  5. The mitral valve is grossly normal. Mild mitral valve regurgitation.  6. The aortic valve is tricuspid. There is mild thickening of the aortic valve. Aortic valve regurgitation is not visualized.  7. The inferior vena cava is normal in size with greater than 50% respiratory variability, suggesting right atrial pressure of 3 mmHg.  8. No obvious valvular vegetations. Comparison(s): Prior images reviewed side by side. LVEF mildly reduced and pericardial effusion is new. FINDINGS  Left Ventricle: Left ventricular ejection fraction, by estimation, is 45 to 50%. The left ventricle has mildly decreased function. The left ventricle demonstrates global hypokinesis. The left ventricular internal cavity size was  normal in size. There is  moderate concentric left ventricular hypertrophy. Left ventricular diastolic parameters are consistent with Grade II diastolic dysfunction (pseudonormalization). Right Ventricle: The right ventricular size is normal. No increase in right ventricular wall thickness. Right ventricular systolic function is normal. Tricuspid regurgitation signal is inadequate for assessing PA pressure. Left Atrium: Left atrial size was moderately dilated. Right Atrium: Right atrial size was normal in size. Pericardium: A small pericardial effusion is present. The pericardial effusion is anterior to the right ventricle. There is no evidence of cardiac  tamponade. Mitral Valve: The mitral valve is grossly normal. There is mild thickening of the mitral valve leaflet(s). Mild mitral annular calcification. Mild mitral valve regurgitation. Tricuspid Valve: The tricuspid valve is grossly normal. Tricuspid valve regurgitation is trivial. Aortic Valve: The aortic valve is tricuspid. There is mild thickening of the aortic valve. There is mild to moderate aortic valve annular calcification. Aortic valve regurgitation is not visualized. Pulmonic Valve: The pulmonic valve was grossly normal. Pulmonic valve regurgitation is trivial. Aorta: The aortic root is normal in size and structure. Venous: The inferior vena cava is normal in size with greater than 50% respiratory variability, suggesting right atrial pressure of 3 mmHg. IAS/Shunts: No atrial level shunt detected by color flow Doppler.  LEFT VENTRICLE PLAX 2D LVIDd:         5.10 cm  Diastology LVIDs:         3.90 cm  LV e' medial:    3.77 cm/s LV PW:         1.50 cm  LV E/e' medial:  22.9 LV IVS:        1.40 cm  LV e' lateral:   7.18 cm/s LVOT diam:     2.00 cm  LV E/e' lateral: 12.0 LV SV:         51 LV SV Index:   27 LVOT Area:     3.14 cm  RIGHT VENTRICLE RV S prime:     14.40 cm/s TAPSE (M-mode): 1.9 cm LEFT ATRIUM              Index       RIGHT ATRIUM            Index LA diam:        4.20 cm  2.23 cm/m  RA Area:     14.50 cm LA Vol (A2C):   104.0 ml 55.29 ml/m RA Volume:   36.00 ml  19.14 ml/m LA Vol (A4C):   64.4 ml  34.24 ml/m LA Biplane Vol: 83.7 ml  44.50 ml/m  AORTIC VALVE LVOT Vmax:   89.00 cm/s LVOT Vmean:  53.600 cm/s LVOT VTI:    0.161 m  AORTA Ao Root diam: 3.50 cm MITRAL VALVE MV Area (PHT): 3.65 cm    SHUNTS MV Decel Time: 208 msec    Systemic VTI:  0.16 m MV E velocity: 86.40 cm/s  Systemic Diam: 2.00 cm MV A velocity: 63.70 cm/s MV E/A ratio:  1.36 Rozann Lesches MD Electronically signed by Rozann Lesches MD Signature Date/Time: 09/08/2021/5:26:36 PM    Final    ECHOCARDIOGRAM COMPLETE  Result Date: 08/24/2021    ECHOCARDIOGRAM REPORT   Patient Name:   DIAZ CRAGO Date of Exam: 08/24/2021 Medical Rec #:  [id number]    Height:       70.0 in Accession #:    [id number]   Weight:       180.0 lb Date of Birth:  [birth year]     BSA:          1.996 m Patient Age:    6 years     BP:           120/69 mmHg Patient Gender: M            HR:           85 bpm. Exam Location:  Inpatient Procedure: 2D Echo, Cardiac Doppler and Color Doppler Indications:    Abnormal EKG  History:        Patient has no  prior history of Echocardiogram examinations.                 COPD, Signs/Symptoms:Dyspnea and Weakness, renal disease,                 anemia, elevated troponin; Risk Factors:Current Smoker.  Sonographer:    Dustin Flock RDCS Referring Phys: [place]  Sonographer Comments: Image acquisition challenging due to COPD. IMPRESSIONS  1. Left ventricular ejection fraction, by estimation, is 55 to 60%. The left ventricle has normal function. The left ventricle has no regional wall motion abnormalities. There is mild concentric left ventricular hypertrophy. Left ventricular diastolic parameters are consistent with Grade I diastolic dysfunction (impaired relaxation).  2. Right ventricular systolic function is normal. The right ventricular size is normal.  Tricuspid regurgitation signal is inadequate for assessing PA pressure.  3. The mitral valve is grossly normal. No evidence of mitral valve regurgitation. No evidence of mitral stenosis.  4. The aortic valve was not well visualized. Aortic valve regurgitation is not visualized. No aortic stenosis is present.  5. The inferior vena cava is normal in size with greater than 50% respiratory variability, suggesting right atrial pressure of 3 mmHg. Comparison(s): No prior Echocardiogram. FINDINGS  Left Ventricle: Left ventricular ejection fraction, by estimation, is 55 to 60%. The left ventricle has normal function. The left ventricle has no regional wall motion abnormalities. The left ventricular internal cavity size was normal in size. There is  mild concentric left ventricular hypertrophy. Left ventricular diastolic parameters are consistent with Grade I diastolic dysfunction (impaired relaxation). Right Ventricle: The right ventricular size is normal. No increase in right ventricular wall thickness. Right ventricular systolic function is normal. Tricuspid regurgitation signal is inadequate for assessing PA pressure. Left Atrium: Left atrial size was normal in size. Right Atrium: Right atrial size was normal in size. Pericardium: There is no evidence of pericardial effusion. Mitral Valve: The mitral valve is grossly normal. No evidence of mitral valve regurgitation. No evidence of mitral valve stenosis. Tricuspid Valve: The tricuspid valve is normal in structure. Tricuspid valve regurgitation is not demonstrated. No evidence of tricuspid stenosis. Aortic Valve: The aortic valve was not well visualized. Aortic valve regurgitation is not visualized. No aortic stenosis is present. Pulmonic Valve: The pulmonic valve was not well visualized. Pulmonic valve regurgitation is not visualized. Aorta: The aortic root is normal in size and structure. Venous: The inferior vena cava is normal in size with greater than 50% respiratory  variability, suggesting right atrial pressure of 3 mmHg. IAS/Shunts: The atrial septum is grossly normal.  LEFT VENTRICLE PLAX 2D LVIDd:         5.80 cm      Diastology LVIDs:         3.60 cm      LV e' medial:    5.55 cm/s LV PW:         1.30 cm      LV E/e' medial:  14.5 LV IVS:        1.30 cm      LV e' lateral:   6.74 cm/s LVOT diam:     2.40 cm      LV E/e' lateral: 12.0 LV SV:         76 LV SV Index:   38 LVOT Area:     4.52 cm  LV Volumes (MOD) LV vol d, MOD A4C: 124.0 ml LV vol s, MOD A4C: 47.8 ml LV SV MOD A4C:  124.0 ml RIGHT VENTRICLE RV Basal diam:  2.80 cm RV S prime:     10.10 cm/s TAPSE (M-mode): 2.9 cm LEFT ATRIUM             Index       RIGHT ATRIUM           Index LA diam:        3.80 cm 1.90 cm/m  RA Area:     13.10 cm LA Vol (A2C):   31.8 ml 15.93 ml/m RA Volume:   29.70 ml  14.88 ml/m LA Vol (A4C):   33.0 ml 16.53 ml/m LA Biplane Vol: 34.9 ml 17.49 ml/m  AORTIC VALVE LVOT Vmax:   98.30 cm/s LVOT Vmean:  60.300 cm/s LVOT VTI:    0.169 m  AORTA Ao Root diam: 3.30 cm MITRAL VALVE MV Area (PHT): 3.85 cm    SHUNTS MV Decel Time: 197 msec    Systemic VTI:  0.17 m MV E velocity: 80.60 cm/s  Systemic Diam: 2.40 cm MV A velocity: 49.30 cm/s MV E/A ratio:  1.63 Rudean Haskell MD Electronically signed by Rudean Haskell MD Signature Date/Time: 08/24/2021/11:38:40 AM    Final    CT Renal Stone Study  Result Date: 08/23/2021 CLINICAL DATA:  Hematuria, new renal failure EXAM: CT ABDOMEN AND PELVIS WITHOUT CONTRAST TECHNIQUE: Multidetector CT imaging of the abdomen and pelvis was performed following the standard protocol without IV contrast. COMPARISON:  06/26/2007 FINDINGS: Lower chest: There is mild, scattered, nonspecific ground-glass and fine nodularity throughout the included bilateral lung bases (series 5, 4). Hepatobiliary: No solid liver abnormality is seen. No gallstones, gallbladder wall thickening, or biliary dilatation. Pancreas: Unremarkable. No pancreatic ductal dilatation  or surrounding inflammatory changes. Spleen: Normal in size without significant abnormality. Adrenals/Urinary Tract: Adrenal glands are unremarkable. Kidneys are normal, without renal calculi, solid lesion, or hydronephrosis. Distended urinary bladder, measuring at least 17.4 cm. Stomach/Bowel: Stomach is within normal limits. Appendix not clearly visualized and may be surgically. No evidence of bowel wall thickening, distention, or inflammatory changes. Descending and sigmoid diverticulosis. Vascular/Lymphatic: Aortic atherosclerosis. No enlarged abdominal or pelvic lymph nodes. Reproductive: No mass or other significant abnormality. Other: Small, fat containing bilateral inguinal hernias no abdominopelvic ascites. Musculoskeletal: No acute or significant osseous findings. IMPRESSION: 1. No evidence of urinary tract calculus or hydronephrosis. 2. Distended urinary bladder, measuring at least 17.4 cm. Correlate for urinary retention. 3. Descending and sigmoid diverticulosis without evidence of acute diverticulitis. 4. Mild, nonspecific scattered ground-glass and fine nodularity throughout the bilateral lung bases, possibly infectious or inflammatory. Aortic Atherosclerosis (ICD10-I70.0). Electronically Signed   By: Eddie Candle M.D.   On: 08/23/2021 16:04   US BIOPSY (KIDNEY)  Result Date: 08/28/2021 INDICATION: Acute kidney injury of uncertain etiology. Please perform image guided biopsy for tissue diagnostic purposes. EXAM: ULTRASOUND GUIDED RENAL BIOPSY COMPARISON:  CT abdomen and pelvis-08/23/2021 MEDICATIONS: None. ANESTHESIA/SEDATION: Fentanyl 100 mcg IV; Versed 2 mg IV Total Moderate Sedation time: 14 minutes; The patient was continuously monitored during the procedure by the interventional radiology nurse under my direct supervision. COMPLICATIONS: None immediate. PROCEDURE: Informed written consent was obtained from the patient after a discussion of the risks, benefits and alternatives to treatment. The  patient understands and consents the procedure. A timeout was performed prior to the initiation of the procedure. Ultrasound scanning was performed of the bilateral flanks. The inferior pole of the right kidney was selected for biopsy due to location and sonographic window. The procedure was planned. The operative site was  prepped and draped in the usual sterile fashion. The overlying soft tissues were anesthetized with 1% lidocaine with epinephrine. A 17 gauge core needle biopsy device was advanced into the inferior cortex of the right kidney and 3 core biopsies were obtained under direct ultrasound guidance. Images were saved for documentation purposes. The biopsy device was removed and hemostasis was obtained with manual compression. Post procedural scanning was negative for significant post procedural hemorrhage or additional complication. A dressing was placed. The patient tolerated the procedure well without immediate post procedural complication. IMPRESSION: Technically successful ultrasound guided right renal biopsy. Electronically Signed   By: Sandi Mariscal M.D.   On: 08/28/2021 12:52   IR EMBO ART  VEN HEMORR LYMPH EXTRAV  INC GUIDE ROADMAPPING  Result Date: 08/31/2021 INDICATION: 70 year old male with history of acute kidney injury of uncertain etiology status post ultrasound-guided right renal biopsy on 08/28/2021. Since biopsy, the patient has experienced gross hematuria with associated acute anemia. EXAM: 1. Ultrasound-guided vascular access of the right internal jugular vein. 2. Temporary hemodialysis catheter placement. 3. Ultrasound-guided vascular access of the right common femoral artery. 4. Selective catheterization and angiography of the right renal artery. 5. Sub selective catheterization angiography of right inferior polar and arcuate artery branches. 6. Coil embolization of right inferior pole arcuate artery branch. MEDICATIONS: None. ANESTHESIA/SEDATION: Moderate (conscious) sedation was  employed during this procedure. A total of Versed 3 mg and Fentanyl 50 mcg was administered intravenously. Moderate Sedation Time: 66 minutes. The patient's level of consciousness and vital signs were monitored continuously by radiology nursing throughout the procedure under my direct supervision. CONTRAST:  59m OMNIPAQUE IOHEXOL 350 MG/ML SOLN, 522mOMNIPAQUE IOHEXOL 350 MG/ML SOLN FLUOROSCOPY TIME:  Fluoroscopy Time: 10.6 minutes, (811 mGy). COMPLICATIONS: None immediate. PROCEDURE: Informed consent was obtained from the patient following explanation of the procedure, risks, benefits and alternatives. The patient understands, agrees and consents for the procedure. All questions were addressed. A time out was performed prior to the initiation of the procedure. Maximal barrier sterile technique utilized including caps, mask, sterile gowns, sterile gloves, large sterile drape, hand hygiene, and chlorhexidine prep. Preprocedure ultrasound evaluation of the right internal jugular vein demonstrated a patent and compressible vein free of internal echoes. Procedure was planned. Subdermal Local anesthesia was provided 1% lidocaine. A small skin nick was made. Under direct ultrasound visualization, a 21 gauge micropuncture needle was directed into the internal jugular vein. An image was captured and stored in the permanent record. A micropuncture set was inserted and exchanged for a J wire which was positioned in the inferior vena cava. Serial dilation was performed followed by placement of a 12.5 French, 24 cm Trialysis catheter. The catheter tip was positioned in the right atrium. Each lumen flushed and aspirated appropriately. The dialysis ports were locked with appropriate volume of heparin dwell. The middle central venous port was then used for sedation for the remainder of the procedure. The catheter was secured with a 0 silk retention suture. A sterile bandage was applied. Preprocedure ultrasound evaluation of the  right groin was performed which demonstrated a patent right common femoral artery. The procedure was planned. Subdermal Local anesthesia was provided with 1% lidocaine. A small skin nick was made. Under direct ultrasound visualization, a 21 gauge micropuncture needle was directed into the common femoral artery. An ultrasound image was captured and stored in the permanent record. A micro puncture set was inserted and a limited right lower extremity angiogram was performed which demonstrated appropriate puncture site for closure device  use. A J wire was directed to the abdominal aorta and the micropuncture set was exchanged for a 5 [place] vascular sheath. A 5 French C2 catheter was then directed into the right renal ostium. Right renal angiogram was performed. The single main renal artery was patent. 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 A straight lantern microcatheter and 0.014" soft synchro wire was then inserted and directed into the inferior polar branch. Repeat angiogram was performed in the sub selective location which was significant for multifocal 2-4 mm pseudoaneurysm formation, abnormal truncation and irregularity of the arcuate and interlobular branches, an early arteriovenous shunting. The inferior polar arcuate branch was selected further. Coil embolization was performed with multiple low profile Penumbra Ruby coils ranging from 2-3 mm in diameter. Completion right inferior renal angiogram was performed which demonstrated appropriate embolization of the targeted vessels without persistent arteriovenous fistula or vessel irregularity. The catheters were removed. The right common femoral artery was then closed with a 6 [place] Angio-Seal device. Distal pulses were unchanged. The patient tolerated  the procedure well was transferred back to the floor in good condition. IMPRESSION: 1. 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 2. Sub selective coil embolization of right inferior polar arcuate artery branch. 3. Successful placement of right internal jugular, 24 French Trialysis catheter with the catheter tip in the right atrium. The catheter is ready for immediate use. Ruthann Cancer, MD [facility] Radiology Electronically Signed   By: Ruthann Cancer M.D.   On: 08/31/2021 09:29    Surgical Pathology  CASE: [id number]  PATIENT: Dravin Kaufhold  Bone Marrow Report   Clinical History: Amyloid , right Iliac (BH)   DIAGNOSIS:   BONE MARROW, ASPIRATE, CLOT, CORE:  - Plasma cell myeloma, see comment.   PERIPHERAL BLOOD:  - Normocytic anemia.   COMMENT:   The marrow is normocellular but exhibits increased monoclonal plasma  cells (17% aspirate, 15-20% CD138 immunohistochemistry). The findings  are consistent with plasma cell myeloma. Congo red is pending and will  be reported in an addendum. There is some atypia in the megakaryocytes  and FISH for MDS was added for completeness.   MICROSCOPIC DESCRIPTION:   PERIPHERAL BLOOD SMEAR: There is a normocytic anemia with occasional  hypochromic cells.  There is no rouleaux formation.  Leukocytes are  present in normal numbers.  Circulating plasma cells are not identified.  Platelets are present in normal numbers.   BONE MARROW ASPIRATE: Spicular and cellular.  Erythroid precursors: Relative decrease in numbers.  No significant  dysplasia.  Granulocytic precursors: Relative increase in numbers.  No significant  dysplasia.  No increase in blasts.  Megakaryocytes: Mild increase in numbers.  Occasional forms with  hypolobated or abnormal nuclei.   Lymphocytes/plasma cells: Plasma cells are increased in numbers (6% by  manual differential counts) with atypical forms (multinucleation, large  forms).  Lymphocytes are not increased.   TOUCH PREPARATIONS: Similar to aspirate smears.   CLOT AND BIOPSY: The core biopsy and clot section are normocellular for  age (30%).  There is a mild myeloid hyperplasia.  Megakaryocytes are  increased in numbers with scattered atypical forms. CD138  immunohistochemistry reveals increased plasma cells (15-20%) which are  scattered  and with small clusters. By light chain in situ hybridization  the plasma cells are lambda restricted.   IRON STAIN: Iron stains are performed on a bone marrow aspirate or touch  imprint smear and section of clot. The controls stained appropriately.        Storage Iron: Present       Ring Sideroblasts: Absent   ADDITIONAL DATA/TESTING: Cytogenetics, including FISH for myeloma and  MDS, was ordered and will be reported in an addendum.   CELL COUNT DATA:   Bone Marrow count performed on 500 cells shows:  Blasts:   0%   Myeloid:  66%  Promyelocytes: 0%   Erythroid:     11%  Myelocytes:    8%   Lymphocytes:   6%  Metamyelocytes:     1%   Plasma cells:  17%  Bands:    8%  Neutrophils:   41%  M:E ratio:     6.0  Eosinophils:   8%  Basophils:     0%  Monocytes:     0%   Lab Data: CBC performed on 09/05/21 shows:  WBC: 8.5 k/uL  Neutrophils:   57%  Hgb: 9.6 g/dL  Lymphocytes:   27%  HCT: 30.3 %    Monocytes:     8%  MCV: 84.6 fL   Eosinophils:   6%  RDW: 17.4 %    Basophils:     2%  PLT: 380 k/uL    ASSESSMENT AND PLAN: 1.  AL amyloidosis/plasma cell myeloma 2.  Anemia secondary to renal insufficiency, iron deficiency, and folate deficiency 3.  Acute kidney injury secondary to #1 4.  History of nephrolithiasis 5.  Streptococcus agalactiae bacteremia diagnosed 09/07/2021   -Labs from today have been reviewed.  CBC adequate to proceed with chemotherapy.  Creatinine  up to 5.91.  Okay to proceed with chemotherapy despite creatinine.  We discussed proceeding with day 8 of cycle 1 of CyBorD.  He agrees to proceed.  Chemotherapy to be administered later this morning.  He received a dose of IV Zofran at the time my visit per his request.  We will tentatively plan for day 15 of cycle #1 as an outpatient on 10/3.  We will plan to add subcu daratumumab on day 15 as an outpatient.  He may be discharged from our standpoint at any time if otherwise medically stable. -His creatinine is rising.  Nephrology is following.  They plan for hemodialysis in the near future.  Would recommend waiting on starting hemodialysis until at least 24 hours after chemotherapy administration. -Cardiac MRI suggestive of cardiac involvement by AL amyloidosis.  Plan is to continue chemotherapy as outlined above.  Outpatient cardiology follow-up has been arranged. -Continue acyclovir 200 mg every 12 hours.  Renally dosed. -His hemoglobin remains stable.  He is status post 4 doses of IV ferric gluconate.  He is also receiving folic acid.  Transfuse for hemoglobin less than 7.5.   LOS: 25 days   Mikey Bussing, DNP, AGPCNP-BC, AOCNP 09/17/21   Addendum  I have seen the patient, examined him. I agree with the assessment and and plan and have edited the notes.   Pt's renal function has been slightly worse, may need long term HD. He has mild intermittent nausea. Lab reviewed, will proceed with second cycle CyBord today at same dose. He is scheduled for cycle 3 chemo in our office next Monday, and plan to start Dara injection with cycle 3 chemo. OK to discharge some time in  next few days per primary team and renal team. I will call in oral cytoxan to [place] this week.   Truitt Merle  09/17/2021

## 2021-09-18 ENCOUNTER — Other Ambulatory Visit (HOSPITAL_COMMUNITY): Payer: Self-pay

## 2021-09-18 ENCOUNTER — Telehealth: Payer: Self-pay

## 2021-09-18 ENCOUNTER — Encounter (HOSPITAL_COMMUNITY): Payer: Commercial Managed Care - PPO

## 2021-09-18 ENCOUNTER — Inpatient Hospital Stay (HOSPITAL_COMMUNITY): Payer: Commercial Managed Care - PPO

## 2021-09-18 ENCOUNTER — Encounter: Payer: Self-pay | Admitting: Hematology

## 2021-09-18 ENCOUNTER — Telehealth: Payer: Self-pay | Admitting: Pharmacist

## 2021-09-18 DIAGNOSIS — Z515 Encounter for palliative care: Secondary | ICD-10-CM

## 2021-09-18 DIAGNOSIS — E440 Moderate protein-calorie malnutrition: Secondary | ICD-10-CM

## 2021-09-18 DIAGNOSIS — E8581 Light chain (AL) amyloidosis: Secondary | ICD-10-CM | POA: Diagnosis not present

## 2021-09-18 DIAGNOSIS — R7881 Bacteremia: Secondary | ICD-10-CM | POA: Diagnosis not present

## 2021-09-18 DIAGNOSIS — B951 Streptococcus, group B, as the cause of diseases classified elsewhere: Secondary | ICD-10-CM | POA: Diagnosis not present

## 2021-09-18 DIAGNOSIS — E854 Organ-limited amyloidosis: Secondary | ICD-10-CM | POA: Diagnosis not present

## 2021-09-18 DIAGNOSIS — Z7189 Other specified counseling: Secondary | ICD-10-CM

## 2021-09-18 DIAGNOSIS — N186 End stage renal disease: Secondary | ICD-10-CM

## 2021-09-18 DIAGNOSIS — N179 Acute kidney failure, unspecified: Secondary | ICD-10-CM | POA: Diagnosis not present

## 2021-09-18 HISTORY — PX: IR FLUORO GUIDE CV LINE RIGHT: IMG2283

## 2021-09-18 HISTORY — PX: IR US GUIDE VASC ACCESS RIGHT: IMG2390

## 2021-09-18 LAB — CBC WITH DIFFERENTIAL/PLATELET
Abs Immature Granulocytes: 0.11 10*3/uL — ABNORMAL HIGH (ref 0.00–0.07)
Basophils Absolute: 0.1 10*3/uL (ref 0.0–0.1)
Basophils Relative: 1 %
Eosinophils Absolute: 0 10*3/uL (ref 0.0–0.5)
Eosinophils Relative: 0 %
HCT: 28.9 % — ABNORMAL LOW (ref 39.0–52.0)
Hemoglobin: 9.3 g/dL — ABNORMAL LOW (ref 13.0–17.0)
Immature Granulocytes: 1 %
Lymphocytes Relative: 13 %
Lymphs Abs: 1.6 10*3/uL (ref 0.7–4.0)
MCH: 26.9 pg (ref 26.0–34.0)
MCHC: 32.2 g/dL (ref 30.0–36.0)
MCV: 83.5 fL (ref 80.0–100.0)
Monocytes Absolute: 0.5 10*3/uL (ref 0.1–1.0)
Monocytes Relative: 5 %
Neutro Abs: 9.7 10*3/uL — ABNORMAL HIGH (ref 1.7–7.7)
Neutrophils Relative %: 80 %
Platelets: 347 10*3/uL (ref 150–400)
RBC: 3.46 MIL/uL — ABNORMAL LOW (ref 4.22–5.81)
RDW: 18.4 % — ABNORMAL HIGH (ref 11.5–15.5)
WBC: 12 10*3/uL — ABNORMAL HIGH (ref 4.0–10.5)
nRBC: 0 % (ref 0.0–0.2)

## 2021-09-18 LAB — RENAL FUNCTION PANEL
Albumin: 1.6 g/dL — ABNORMAL LOW (ref 3.5–5.0)
Anion gap: 12 (ref 5–15)
BUN: 45 mg/dL — ABNORMAL HIGH (ref 8–23)
CO2: 23 mmol/L (ref 22–32)
Calcium: 8.6 mg/dL — ABNORMAL LOW (ref 8.9–10.3)
Chloride: 98 mmol/L (ref 98–111)
Creatinine, Ser: 6.05 mg/dL — ABNORMAL HIGH (ref 0.61–1.24)
GFR, Estimated: 9 mL/min — ABNORMAL LOW (ref >60)
Glucose, Bld: 108 mg/dL — ABNORMAL HIGH (ref 70–99)
Phosphorus: 6.3 mg/dL — ABNORMAL HIGH (ref 2.5–4.6)
Potassium: 4.9 mmol/L (ref 3.5–5.1)
Sodium: 133 mmol/L — ABNORMAL LOW (ref 135–145)

## 2021-09-18 IMAGING — US IR FLUORO GUIDE CV LINE*R*
2 series · 2 of 2 positions shown · non-contrast
Comparison: none

INDICATION: 70-year-old male referred for tunneled hemodialysis catheter

[Series 1: ir fluoro guide cv line*right* · 1 of 1 slices shown]
[im 1/1]
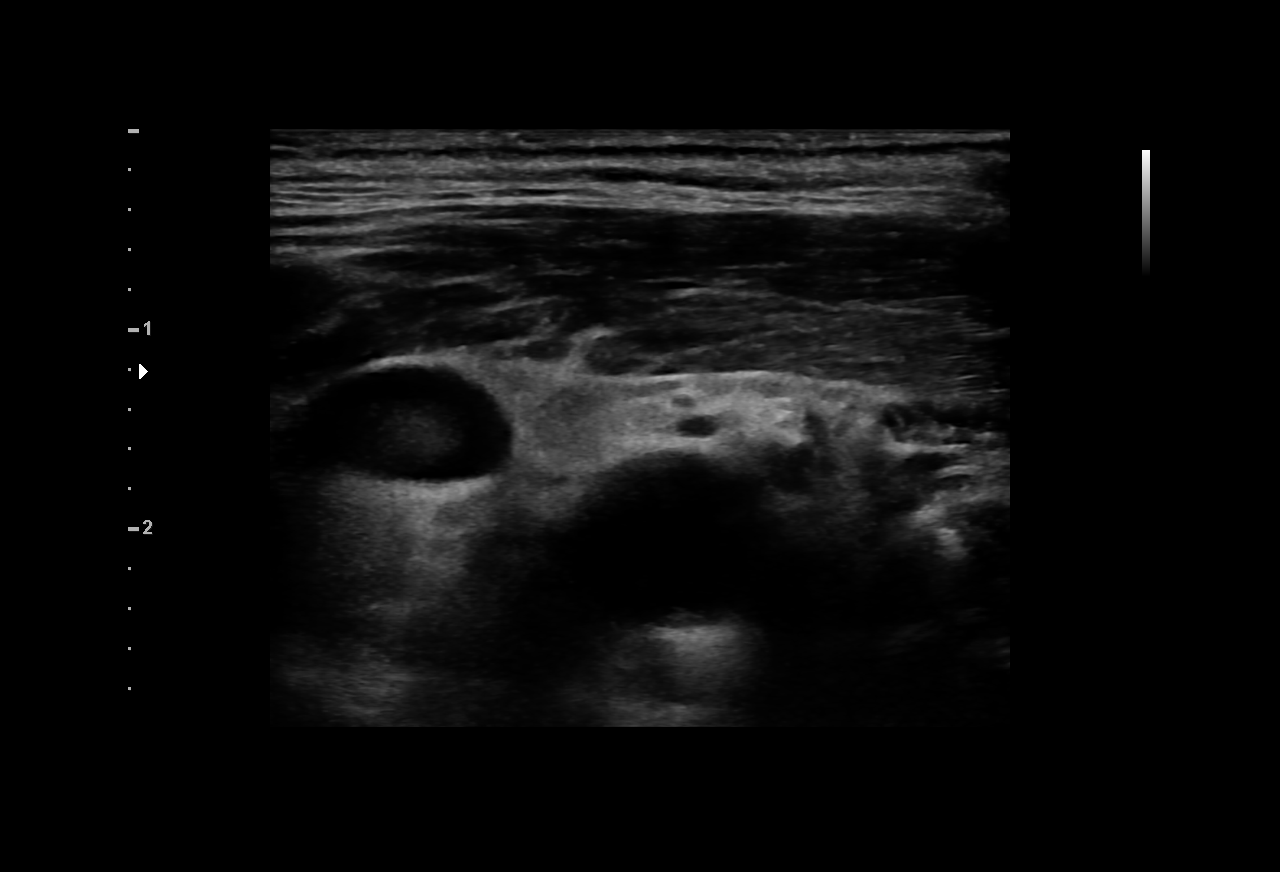

[Series 1: fl (-) angio · 1 of 1 slices shown]
[im 1/1]
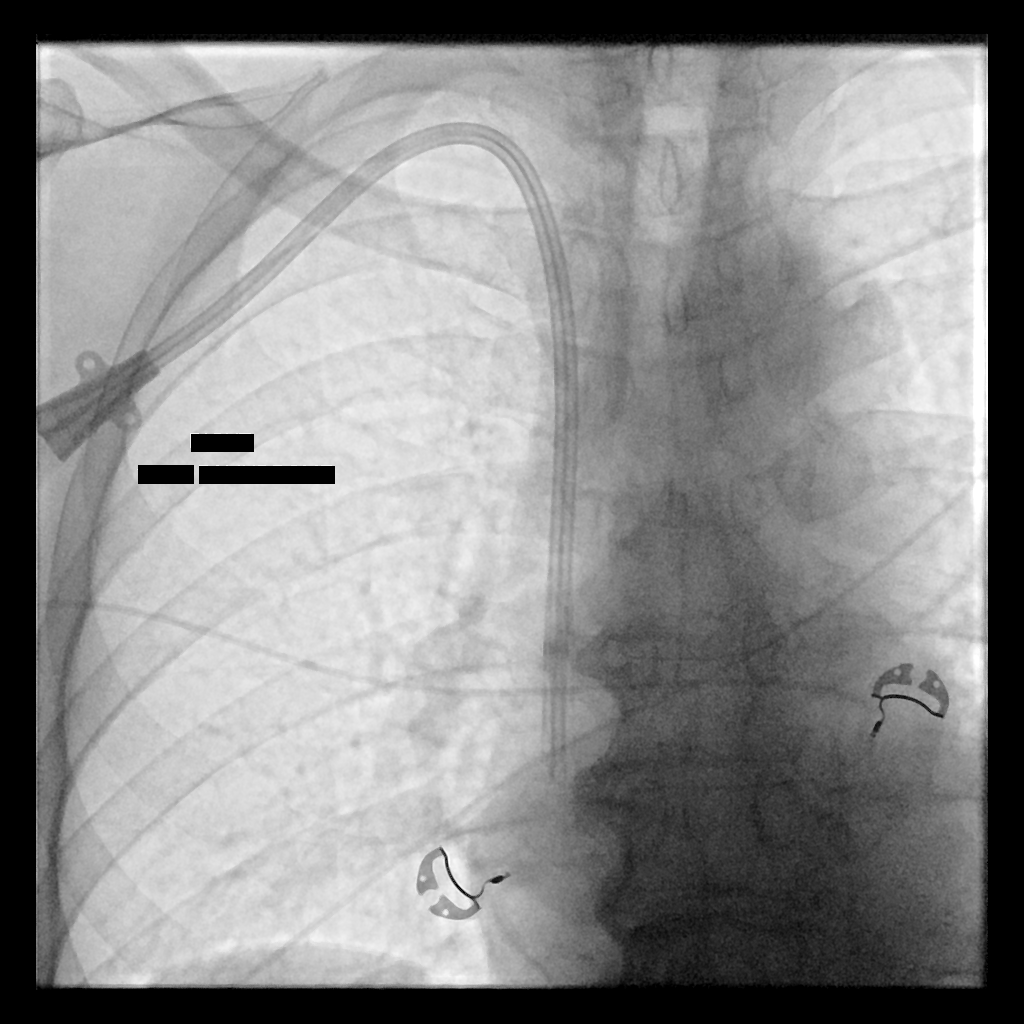

[2 of 2 positions shown; findings below may reference images not displayed]

EXAM:
TUNNELED CENTRAL VENOUS HEMODIALYSIS CATHETER PLACEMENT WITH
ULTRASOUND AND FLUOROSCOPIC GUIDANCE

MEDICATIONS:
2 g Ancef. The antibiotic was given in an appropriate time interval
prior to skin puncture.

ANESTHESIA/SEDATION:
Moderate (conscious) sedation was employed during this procedure. A
total of Versed 4.0 mg and Fentanyl 200 mcg was administered
intravenously.

Moderate Sedation Time: 20 minutes. The patient's level of
consciousness and vital signs were monitored continuously by
radiology nursing throughout the procedure under my direct
supervision.

FLUOROSCOPY TIME:  Fluoroscopy Time: 0 minutes 30 seconds (2 mGy).

COMPLICATIONS:
None



Ultrasound survey was performed.

Micropuncture kit was utilized to access the right internal jugular
vein under direct, real-time ultrasound guidance after the overlying
soft tissues were anesthetized with 1% lidocaine with epinephrine.
Stab incision was made with 11 blade scalpel. Microwire would not
pass centrally. Once we confirmed that the tip of the needle was
within the partially occluded IJ, a Nitrex wire was passed into the
right heart. The needle was removed and the micro puncture was set
was advanced onto the Nitrex wire. Nitrex wire was then exchanged
for the standard microwire. The microwire was then marked to measure
appropriate internal catheter length. External tunneled length was
estimated. A total tip to cuff length of 19 cm was selected.

035 guidewire was advanced to the level of the IVC.

Skin and subcutaneous tissues of chest wall below the clavicle were
generously infiltrated with 1% lidocaine for local anesthesia. A
small stab incision was made with 11 blade scalpel. The selected
hemodialysis catheter was tunneled in a retrograde fashion from the
anterior chest wall to the venotomy incision.

Serial dilation was performed and then a peel-away sheath was
placed.

The catheter was then placed through the peel-away sheath with tips
ultimately positioned within the superior aspect of the right
atrium. Final catheter positioning was confirmed and documented with
a spot radiographic image. The catheter aspirates and flushes
normally. The catheter was flushed with appropriate volume heparin
dwells.

The catheter exit site was secured with a 0-Prolene retention
suture. Gel-Foam slurry was infused into the soft tissue tract.

The venotomy incision was closed Derma bond and sterile dressing.
Dressings were applied at the chest wall.

Patient tolerated the procedure well and remained hemodynamically
stable throughout.

No complications were encountered and no significant blood loss
encountered.
IMPRESSION: Status post image guided placement of right IJ tunneled hemodialysis
catheter.

## 2021-09-18 MED ORDER — MIDAZOLAM HCL 2 MG/2ML IJ SOLN
INTRAMUSCULAR | Status: DC | PRN
  Administered 2021-09-18 (×4): 1 mg via INTRAVENOUS

## 2021-09-18 MED ORDER — MIDAZOLAM HCL 2 MG/2ML IJ SOLN
INTRAMUSCULAR | Status: AC
  Filled 2021-09-18: qty 2

## 2021-09-18 MED ORDER — HEPARIN SODIUM (PORCINE) 1000 UNIT/ML IJ SOLN
INTRAMUSCULAR | Status: AC
  Filled 2021-09-18: qty 1

## 2021-09-18 MED ORDER — FENTANYL CITRATE (PF) 100 MCG/2ML IJ SOLN
INTRAMUSCULAR | Status: AC
  Filled 2021-09-18: qty 2

## 2021-09-18 MED ORDER — CHLORHEXIDINE GLUCONATE CLOTH 2 % EX PADS
6.0000 | MEDICATED_PAD | Freq: Every day | CUTANEOUS | Status: DC
  Administered 2021-09-18 – 2021-09-24 (×6): 6 via TOPICAL

## 2021-09-18 MED ORDER — FENTANYL CITRATE (PF) 100 MCG/2ML IJ SOLN
INTRAMUSCULAR | Status: DC | PRN
  Administered 2021-09-18 (×4): 50 ug via INTRAVENOUS

## 2021-09-18 MED ORDER — CEFAZOLIN SODIUM-DEXTROSE 2-4 GM/100ML-% IV SOLN
INTRAVENOUS | Status: DC | PRN
  Administered 2021-09-18: 2 g via INTRAVENOUS

## 2021-09-18 MED ORDER — GELATIN ABSORBABLE 12-7 MM EX MISC
CUTANEOUS | Status: AC
  Filled 2021-09-18: qty 1

## 2021-09-18 MED ORDER — LIDOCAINE HCL 1 % IJ SOLN
INTRAMUSCULAR | Status: AC
  Administered 2021-09-18: 10 mL
  Filled 2021-09-18: qty 20

## 2021-09-18 MED ORDER — KIDNEY FAILURE BOOK: Freq: Once | Status: AC

## 2021-09-18 MED ORDER — HEPARIN SODIUM (PORCINE) 5000 UNIT/ML IJ SOLN
5000.0000 [IU] | Freq: Three times a day (TID) | INTRAMUSCULAR | Status: DC
  Administered 2021-09-18 – 2021-09-24 (×18): 5000 [IU] via SUBCUTANEOUS
  Filled 2021-09-18 (×18): qty 1

## 2021-09-18 MED ORDER — CEFAZOLIN SODIUM-DEXTROSE 2-4 GM/100ML-% IV SOLN
INTRAVENOUS | Status: AC
  Filled 2021-09-18: qty 100

## 2021-09-18 NOTE — Procedures (Signed)
Interventional Radiology Procedure Note  Procedure: Placement of a right IJ approach tunneled HD catheter.  Tip is positioned at the superior cavoatrial junction and catheter is ready for immediate use.  Complications: None Recommendations:  - OK to use - Do not submerge - Routine line care   Signed,  Jerold Yoss S. Ademide Schaberg, DO   

## 2021-09-18 NOTE — Progress Notes (Signed)
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  She is undergoing dialysis 09/07/2021.  We are dialyzing on a as needed basis.  We are coordinating treatment with his chemotherapy.  Last dialysis was 09/07/2021 with 2.4 L removed.  He is making good urine with 1.9 L    09/15/2021    we discussed dialysis and/or palliative medicine and he wishes to proceed with HD if necessary.  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 MRI suggestive of cardiac involvement   Assessment/ Plan:   #Acute kidney injury, nonoliguric, with history of excessive NSAID's use/BC powders every day.  Peaked creatinine level of 6.95. [name]  He underwent IR guided kidney biopsy on 9/6 complicated by postbiopsy bleeding.  Renal biopsy: AL amyloidosis, lambda light chain composition (glomeruli, interstitium, arteries, and arterioles). Marked IF, 59% global glomerular sclerosis and marked arteriosclerosis.     He received a one time treatment of dialysis 9/16. However renal function continues to remain poor. Prognosis is poor, Patient is considering either palliative medicine or dialysis -> very stressed understandably but wants RRT if needed. UOP is very good. He is right arm dominant.    - Has baseline nausea even between chemo treatments with appetite that is not great. D/W pt and we will start RRT as ESRD as this is very possibly uremia. Will also CLIP.   - 1st HD treatment for 09/19/21 AM 1st shift.  Scheduled  f/u with [facility] with Dr. Senaida Lange Oct 12th at 230pm and will cancel.  #Post kidney biopsy 08/28/2021   #AL Amyloidosis on kidney biopsy -Treatment per oncology -> d15 of cycle #1 as outpt on 10/3 CyBORD     #Sepsis 2/2  grp b strep.  Per primary service     #Pulm edema appears to have resolved with good diuresis     #Anemia, appears to be relatively stable with a hemoglobin is unchanged   #History of nephrolithiasis: Followed by urology   #Metabolic acidosis: Appears to have resolved continue oral sodium bicarbonate at this point   #Mild hyperkalemia we will continue Lokelma as needed.   Subjective:   Great UOP, no hypotensive episodes but BP is on the lower side.  Nausea worse after chemo but always has a baseline nausea. Appetite not great either.   Objective:   BP 140/89   Pulse 90   Temp 97.7 F (36.5 C) (Oral)   Resp 18   Ht 5\' 10"  (1.778 m)   Wt 72.7 kg   SpO2 98%   BMI 23.00 kg/m   Intake/Output Summary (Last 24 hours) at 09/18/2021 2707 Last data filed at 09/18/2021 0539 Gross per 24 hour  Intake --  Output 1050 ml  Net -1050 ml   Weight change: 0.412 kg  Physical Exam: General: nad Heart: rrr Lungs: normal wob, cta bl Abdomen: Soft, nontender Extremities: No LE edema. Neurology: Alert, awake, following commands, no asterixis Vascular Access: Right IJ temporary HD catheter removed.  Imaging: No results found.  Labs: BMET Recent Labs  Lab 09/12/21 0217 09/13/21 0425 09/14/21 0430 09/15/21 0144 09/16/21 0139 09/16/21 0737 09/17/21 0514 09/18/21 0410  NA 134* 135 135 132* 135 133* 134* 133*  K 4.6 4.8 4.8 4.8 5.8* 5.3* 5.1 4.9  CL 101  101 102 100 101 101 100 98  CO2 22 23 24 22  21* 23 22 23   GLUCOSE 96 96 99 95 91 83 91 108*  BUN 39* 39* 38* 39* 42* 39* 41* 45*  CREATININE 5.09* 5.09* 5.31* 5.37* 5.48* 5.69* 5.91* 6.05*  CALCIUM 7.9* 8.1* 8.3* 8.1* 8.2* 8.6* 8.3* 8.6*  PHOS 3.6 4.5 5.0* 5.3* 5.9*  --  6.5* 6.3*   CBC Recent Labs  Lab 09/15/21 0144 09/16/21 0139 09/17/21 0514 09/18/21 0410  WBC 11.5* 11.0* 10.0 12.0*  NEUTROABS 7.2 7.2 6.6 9.7*  HGB 9.2* 9.3* 9.3* 9.3*  HCT 28.4* 29.9* 29.4* 28.9*  MCV 83.5 85.7 85.0 83.5  PLT 437* 458* 377 347     Medications:     acyclovir  200 mg Oral Q12H   Chlorhexidine Gluconate Cloth  6 each Topical Q0600   feeding supplement (NEPRO CARB STEADY)  237 mL Oral TID BM   folic acid  2 mg Oral Daily   Gerhardt's butt cream   Topical BID   melatonin  5 mg Oral QHS   nicotine  14 mg Transdermal Q24H   pantoprazole  40 mg Oral BID AC   sodium bicarbonate  1,300 mg Oral TID   sodium zirconium cyclosilicate  10 g Oral TID   tamsulosin  0.4 mg Oral Daily      Otelia Santee, MD 09/18/2021, 9:05 AM

## 2021-09-18 NOTE — Consult Note (Signed)
Palliative Care Consult Note                                  Date: 09/18/2021   Patient Name: Caleb Simmons  DOB: September 15, 1951  MRN: [id number]  Age / Sex: 70 y.o., male  PCP: Pcp, No Referring Physician: Charlynne Cousins, MD  Reason for Consultation: Establishing goals of care  HPI/Patient Profile: Palliative Care consult requested for goals of care discussion in this 70 y.o. male  with past medical history of nephrolithiasis.  He does not see a primary care doctor regularly.  He was admitted on 08/23/2021 with generalized weakness and shortness of breath.  On work-up patient found to have acute kidney injury and severe normocytic anemia.  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  Patient is currently being followed by nephrology and oncology.  History reviewed. No pertinent past medical history.   Subjective:   This NP Osborne Oman reviewed medical records, received report from team, assessed the patient and then met at the patient's bedside with Caleb Simmons to discuss diagnosis, prognosis, GOC, EOL wishes disposition and options.   Concept of Palliative Care was introduced as specialized medical care for people and their families living with serious illness.  It focuses on providing relief from the symptoms and stress of a serious illness.  The goal is to improve quality of life for both the patient and the family. Values and goals of care important to patient and family were attempted to be elicited.  I created space and opportunity for patient to explore state of health prior to admission, thoughts, and feelings.  Caleb Simmons shares prior to hospitalization and current illness he was independent of all ADLs, actively driving, and still working.  He did not require any assistive devices.  Patient became somewhat emotional sharing his distress and losing several of his close  loved ones all in the same year with several months in between there this.  He fortunately lost his wife, mother, and brother.  Emotional support provided.  We discussed His current illness and what it means in the larger context of His on-going co-morbidities. Natural disease trajectory and expectations were discussed.  Caleb Simmons verbalized understanding of current illness and co-morbidities.  Although he has a lot going on medically he feels his quality of life was good prior to current illness.  He is remaining hopeful for some improvement and ability to return home.  He states he is feeling better with some fatigue which she associates with his chemotherapy treatment.  I discussed the importance of continued conversation with family and their medical providers regarding overall plan of care and treatment options, ensuring decisions are within the context of the patients values and GOCs.  Questions and concerns were addressed.  Patient was encouraged to call with questions or concerns.  PMT will continue to support holistically as needed.  Life Review: Patient lives in the home alone.  He has 1 son who resides in [place].  Dates he has a close friend [name]) who will also assist in care needs once he is discharged.  Patient served in the [employer] for many years.  Was actively working prior to current illness for our [employer] which required him to travel often.  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   Objective:  Primary Diagnoses: Present on Admission: . AKI (acute kidney injury) ([place])   Scheduled Meds: . acyclovir  200 mg Oral Q12H  . Chlorhexidine Gluconate Cloth  6 each Topical Q0600  . feeding supplement (NEPRO CARB STEADY)  237 mL Oral TID BM  . folic acid  2 mg Oral Daily  . Gerhardt's butt cream   Topical BID  . melatonin  5 mg Oral QHS  . nicotine  14 mg Transdermal Q24H  . pantoprazole  40 mg Oral BID AC  .  sodium bicarbonate  1,300 mg Oral TID  . sodium zirconium cyclosilicate  10 g Oral TID  . tamsulosin  0.4 mg Oral Daily    Continuous Infusions: . sodium chloride    . sodium chloride 10 mL/hr at 09/17/21 0000  . sodium chloride irrigation      PRN Meds: acetaminophen, ALPRAZolam, bisacodyl, diphenhydrAMINE, lidocaine, ondansetron **OR** ondansetron (ZOFRAN) IV, oxyCODONE, prochlorperazine, promethazine, technetium albumin aggregated, traMADol, traZODone  No Known Allergies  Review of Systems  Musculoskeletal:  Positive for arthralgias.  Neurological:  Positive for weakness.  Unless otherwise noted, a complete review of systems is negative.  Physical Exam General: NAD Cardiovascular: regular rate and rhythm Pulmonary: clear ant fields Abdomen: soft, nontender, + bowel sounds Extremities: no edema, no joint deformities Skin: no rashes, warm and dry Neurological: AAOx3, mood appropriate  Vital Signs:  BP 140/89   Pulse 90   Temp 97.7 F (36.5 C) (Oral)   Resp 18   Ht 5' 10"  (1.778 m)   Wt 72.7 kg   SpO2 98%   BMI 23.00 kg/m  Pain Scale: 0-10 POSS *See Group Information*: S-Acceptable,Sleep, easy to arouse Pain Score: 4   SpO2: SpO2: 98 % O2 Device:SpO2: 98 % O2 Flow Rate: .O2 Flow Rate (L/min): 2 L/min  IO: Intake/output summary:  Intake/Output Summary (Last 24 hours) at 09/18/2021 1056 Last data filed at 09/18/2021 0900 Gross per 24 hour  Intake --  Output 1330 ml  Net -1330 ml    LBM: Last BM Date: 09/16/21 Baseline Weight: Weight: 81.6 kg Most recent weight: Weight: 72.7 kg      Palliative Assessment/Data: PPS 50%   Advanced Care Planning:   Primary Decision Maker: NEXT OF KIN  Code Status/Advance Care Planning: Full code  A discussion was had today regarding advanced directives. Concepts specific to code status, artifical feeding and hydration, continued IV antibiotics and rehospitalization was had.    Discussed patient's full CODE STATUS  with consideration of his current illness and comorbidities.  Patient is requesting to remain a full code.  Patient states he does not have advanced directives however he and his son are planning to complete.  Offered to provide assistance with completion of healthcare advanced directives while hospitalized.  Patient politely declines sharing he has documents prepared and planning to complete with his attorney.  Mr. Ferrall is clear and expressed goals to continue to treat the treatable aggressively.  He states he is feeling better and is tolerating both his chemotherapy and hemodialysis.  He shares plans to continue with ongoing therapies outpatient to allow him every opportunity to continue to thrive.  If his quality of life was to significantly decrease he would then consider reevaluating his goals.  Palliative Care services outpatient were explained and offered. Mr. Glasheen verbalized understanding and awareness of palliative's goals and philosophy of care.  He would like ongoing support from palliative.  About education on outpatient palliative at [facility].  We will plan  to continue to follow-up while hospitalized and have him scheduled outpatient attached to a future appointment  Assessment & Plan:   SUMMARY OF RECOMMENDATIONS   Full Code-as confirmed by patient Continue with current plan of care, the treatable aggressively. Patient is clear and expressed goals to continue ongoing therapies (chemotherapy and hemodialysis).  He feels his quality of life was good prior to current illness and wishes to allow him to have every opportunity to continue to thrive.  We will reevaluate his goals in the event his quality of life significantly declines. Recommendations for outpatient palliative support.  Patient will be receiving treatment at [facility].  Offered to continue with palliative follow-up outpatient.  Mr. Saiki verbalized agreement.  Get him scheduled with myself once  he is discharged for ongoing palliative support. PMT will continue to support and follow as needed while hospitalized.  May not see daily.  Please call team line with urgent needs.  Symptom Management:  Per Attending  Palliative Prophylaxis:  Bowel Regimen and Frequent Pain Assessment  Additional Recommendations (Limitations, Scope, Preferences): Full Comfort Care  Psycho-social/Spiritual:  Desire for further Chaplaincy support: no Additional Recommendations:  Ongoing goals of care  Prognosis:  Guarded  Discharge Planning:  To Be Determined   Patient expressed understanding and was in agreement with this plan.   Time In: 0945 Time Out: 1040 Time Total: 55 min  Visit consisted of counseling and education dealing with the complex and emotionally intense issues of symptom management and palliative care in the setting of serious and potentially life-threatening illness.Greater than 50%  of this time was spent counseling and coordinating care related to the above assessment and plan.  Signed by:  Alda Lea, AGPCNP-BC Palliative Medicine Team  Phone: [phone] Pager: [phone] Amion: Bjorn Pippin   Thank you for allowing the Palliative Medicine Team to assist in the care of this patient. Please utilize secure chat with additional questions, if there is no response within 30 minutes please call the above phone number. Palliative Medicine Team providers are available by phone from 7am to 5pm daily and can be reached through the team cell phone.  Should this patient require assistance outside of these hours, please call the patient's attending physician.

## 2021-09-18 NOTE — Progress Notes (Signed)
Requested to see pt for out-pt HD arrangements. Spoke to pt's son, Lizzie, via phone. Explained role and discussed need for out-pt HD upon d/c. Son states that pt will remain in [place] upon d/c and prefers a clinic in [place] close to pt's home if possible. Son agreeable to proceed with referral to [facility]. Pt was working full-time and driving prior to admission. Son and dtr-in-law can assist with transport to/from HD at d/c if needed. Referral made to [facility] admissions this afternoon. Will follow and assist.    Melven Sartorius Renal Navigator [phone]

## 2021-09-18 NOTE — Telephone Encounter (Signed)
Oral Oncology Patient Advocate Encounter  After completing a benefits investigation, prior authorization for Cytoxan is not required at this time through [employer].  Patient's copay is $15.     [place] [facility] [place] Phone [phone] Fax [phone] 09/18/2021 8:37 AM

## 2021-09-18 NOTE — Consult Note (Signed)
Chief Complaint: Ongoing dialysis access. Request is for tunneled HD catheter placement  Referring Physician(s): Dr. Corliss Marcus  Supervising Physician: Corrie Mckusick  Patient Status: [facility]  History of Present Illness: Caleb Simmons is a 70 y.o. male  inpatient. Known to IR. Smoker. History of nephrolithiasis.. No other known medical history. Presented to the ED at [facility] on 9.1.22. with weakness, dyspnea and recently resolved blurry vision. Found to have AKI and hematuria. 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 On 9.9.22 placed a RIJ temp HD catheter and sub selective coil embolization of the right inferior pole of the right inferior polar arcuate artery branch. On 9.14.22 IR performed a bone marrow biopsy. Hospital stay further complicated by sepsis (group B strep). HD temp catheter was removed for line holiday. Team is requesting tunneled HD catheter at this time.   Currently without any significant complaints. Endorses anxiety. Patient alert and laying in bed, calm and comfortable. Denies any fevers, headache, chest pain, SOB, cough, abdominal pain, nausea, vomiting or bleeding.  Return precautions and treatment recommendations and follow-up discussed with the patient who is agreeable with the plan.   History reviewed. No pertinent past medical history.  Past Surgical History:  Procedure Laterality Date   APPENDECTOMY     IR EMBO ART  VEN HEMORR LYMPH EXTRAV  INC GUIDE ROADMAPPING  08/30/2021   IR FLUORO GUIDE CV LINE RIGHT  08/30/2021   IR RENAL SELECTIVE  UNI INC S&I MOD SED  08/31/2021   IR US GUIDE VASC ACCESS RIGHT  08/30/2021   IR US GUIDE VASC ACCESS RIGHT  08/30/2021    Allergies: Patient has no known allergies.  Medications: Prior to Admission medications   Medication Sig Start Date End Date Taking? Authorizing Provider  amoxicillin-clavulanate (AUGMENTIN) 875-125 MG tablet Take 1 tablet by mouth 2 (two) times daily. 08/14/21  Yes  [provider]  Aspirin-Salicylamide-Caffeine (BC FAST PAIN RELIEF) 650-195-33.3 MG PACK Take 1 Package by mouth daily as needed (pain).   Yes [provider]  promethazine-dextromethorphan (PROMETHAZINE-DM) 6.25-15 MG/5ML syrup Take 5 mLs by mouth every 4 (four) hours as needed for cough. 08/14/21  Yes [provider]  cyclophosphamide (CYTOXAN) 50 MG capsule Take 9 capsules (450 mg total) by mouth once a week. Take with food to minimize GI upset. Take early in the day and maintain hydration. 09/17/21   Truitt Merle, MD     History reviewed. No pertinent family history.  Social History   Socioeconomic History   Marital status: Married    Spouse name: Not on file   Number of children: Not on file   Years of education: Not on file   Highest education level: Not on file  Occupational History   Not on file  Tobacco Use   Smoking status: Some Days    Packs/day: 0.25    Years: 25.00    Pack years: 6.25    Types: Cigarettes   Smokeless tobacco: Never  Substance and Sexual Activity   Alcohol use: Not on file   Drug use: Not on file   Sexual activity: Not on file  Other Topics Concern   Not on file  Social History Narrative   Not on file   Social Determinants of Health   Financial Resource Strain: Low Risk    Difficulty of Paying Living Expenses: Not very hard  Food Insecurity: No Food Insecurity   Worried About [place] in the Last  Year: Never true   [place] in the Last Year: Never true  Transportation Needs: No Transportation Needs   Lack of Transportation (Medical): No   Lack of Transportation (Non-Medical): No  Physical Activity: Not on file  Stress: Not on file  Social Connections: Not on file     Review of Systems: A 12 point ROS discussed and pertinent positives are indicated in the HPI above.  All other systems are negative.  Review of Systems  Constitutional:  Negative for fever.  HENT:  Negative for congestion.    Respiratory:  Negative for cough and shortness of breath.   Cardiovascular:  Negative for chest pain.  Gastrointestinal:  Negative for abdominal pain.  Neurological:  Negative for headaches.  Psychiatric/Behavioral:  Negative for behavioral problems and confusion.   All other systems reviewed and are negative.  Vital Signs: BP 140/89   Pulse 90   Temp 97.7 F (36.5 C) (Oral)   Resp 18   Ht 5' 10"  (1.778 m)   Wt 160 lb 4.8 oz (72.7 kg)   SpO2 98%   BMI 23.00 kg/m   Physical Exam Vitals and nursing note reviewed.  Constitutional:      Appearance: He is well-developed.  HENT:     Head: Normocephalic.  Cardiovascular:     Rate and Rhythm: Normal rate and regular rhythm.  Pulmonary:     Effort: Pulmonary effort is normal.     Breath sounds: Normal breath sounds.  Musculoskeletal:        General: Normal range of motion.     Cervical back: Normal range of motion.  Skin:    General: Skin is dry.  Neurological:     Mental Status: He is alert and oriented to person, place, and time.    Imaging: CT ABDOMEN PELVIS WO CONTRAST  Result Date: 08/30/2021 CLINICAL DATA:  Retroperitoneal hematoma, follow up s/p random renal bx with perinephric hematoma development EXAM: CT ABDOMEN AND PELVIS WITHOUT CONTRAST TECHNIQUE: Multidetector CT imaging of the abdomen and pelvis was performed following the standard protocol without IV contrast. COMPARISON:  September 6 FINDINGS: Inferior chest: Trace bilateral pleural effusions with right greater than left compressive subsegmental atelectasis. Hepatobiliary: The liver is normal in size without focal abnormality. No intrahepatic or extrahepatic biliary ductal dilation. The gallbladder appears normal. Spleen: Normal in size without focal abnormality. Pancreas: No pancreatic ductal dilatation or surrounding inflammatory changes. Adrenals/Urinary Tract: Adrenal glands are unremarkable. The kidneys are normal in size. Tiny perinephric hematoma along the  right renal lower pole is unchanged. Hyperdense material in the right collecting system and bladder consistent with blood products, grossly similar. Stomach/Bowel: The stomach, small bowel and large bowel are normal in caliber without abnormal wall thickening or surrounding inflammatory changes. Reproductive: Prostate is unremarkable. Lymphatic: No enlarged lymph nodes in the abdomen or pelvis. Vasculature: The abdominal aorta is normal in caliber. Aortic atherosclerosis. Other: No abdominopelvic ascites. Musculoskeletal: No aggressive osseous lesions. Degenerative changes at L5-S1. Bone island in the left ilium. IMPRESSION: The small perinephric hematoma along the right renal lower pole is stable, and within expected limits after percutaneous biopsy. However, there remains substantial clot burden within the bladder. Follow-up urology recommendations for management. Electronically Signed   By: Albin Felling M.D.   On: 08/30/2021 14:21   CT ABDOMEN PELVIS WO CONTRAST  Result Date: 08/29/2021 CLINICAL DATA:  Post right-sided renal biopsy, now with hematuria and hypotension. EXAM: CT ABDOMEN AND PELVIS WITHOUT CONTRAST TECHNIQUE: Multidetector CT imaging of the  abdomen and pelvis was performed following the standard protocol without IV contrast. COMPARISON:  CT abdomen pelvis-08/23/2021; ultrasound-guided right renal biopsy-earlier same day FINDINGS: The lack of intravenous contrast limits the ability to evaluate solid abdominal organs. Lower chest: Limited visualization of the lower thorax demonstrates interval development of trace bilateral effusion with worsening bibasilar heterogeneous/consolidative opacities, right greater than left. Previously identified nonspecific ground-glass opacities within the imaged lung bases is not seen on the present examination though there is mild residual intraseptal thickening. Normal heart size. Trace amount of pericardial fluid, unchanged presumably physiologic. There is  diffuse decreased attenuation intra cardiac blood pool suggestive of anemia. Hepatobiliary: Normal hepatic contour. Apparent high density material within the gallbladder could represent biliary sludge. No definitive gallbladder wall thickening or pericholecystic stranding on this noncontrast examination. No ascites. Pancreas: Normal noncontrast appearance of the pancreas. Spleen: Normal noncontrast appearance of the spleen. Adrenals/Urinary Tract: There is a very tiny (approximately 2.7 x 1.6 x 1.2 cm) perinephric hematoma about the inferior pole of the right kidney (axial image 44, series 3; coronal image 57, series 6), with minimal amount of adjacent perinephric stranding. High-density material is seen within the right renal collecting system and ureter with moderate to large amount of layering high-density material within urinary bladder, findings compatible with hemorrhage into the collecting system and bladder. Mild associated right-sided pelviectasis and ureterectasis. Normal noncontrast appearance of the left kidney. No evidence of left-sided nephrolithiasis or urinary obstruction. Normal noncontrast appearance of the bilateral adrenal glands. Stomach/Bowel: Scattered minimal colonic diverticulosis without evidence of superimposed acute diverticulitis on this noncontrast examination. Normal appearance of the terminal ileum. The appendix is not visualized compatible with provided operative history. No discrete areas of bowel wall thickening on this noncontrast examination. No pneumoperitoneum, pneumatosis or portal venous gas. Vascular/Lymphatic: Moderate amount of atherosclerotic plaque within normal caliber abdominal aorta. Scattered retroperitoneal lymph nodes are numerous though individually not enlarged by size criteria with index left sided periaortic lymph node measuring 0.7 cm in greatest short axis diameter (image 31, series 3), presumably reactive in etiology. No bulky retroperitoneal, mesenteric,  pelvic or inguinal lymphadenopathy on this noncontrast examination Reproductive: Dystrophic calcifications within normal sized prostate gland. Trace amount of fluid within the pelvic cul-de-sac. Other: Small bilateral mesenteric fat containing inguinal hernias, left greater than right. Minimal amount of subcutaneous edema about the midline of the low back. Presumed shrapnel is seen within the right lower abdominal/pelvic ventral abdominal wall. Musculoskeletal: No acute or aggressive osseous abnormalities. Mild-to-moderate multilevel lumbar spine DDD, worse at L4-L5 and L5-S1 with disc space height loss, endplate irregularity and small posteriorly directed disc osteophyte complexes at these locations. Mild degenerative change the bilateral hips with joint space loss, subchondral sclerosis and osteophytosis, right greater than left. IMPRESSION: 1. Post right-sided renal biopsy complicated by tiny (approximately 2.7 cm) perinephric hematoma and bleeding into the right renal collecting system including moderate to large-sized clot within the urinary bladder and associated mild right-sided pelviectasis and ureterectasis. Consideration for initiation of continuous bladder irrigation could be performed as indicated. 2. Trace bilateral effusions with associated bibasilar opacities, right greater than left, likely atelectasis. 3. Colonic diverticulosis without evidence superimposed acute diverticulitis. 4.  Aortic Atherosclerosis (ICD10-I70.0). Critical Value/emergent results were called by telephone at the time of interpretation on 08/28/2021 at 5:04 pm to provider [facility] , who verbally acknowledged these results. Electronically Signed   By: Sandi Mariscal M.D.   On: 08/29/2021 10:38   DG Chest 2 View  Result Date: 09/06/2021 CLINICAL DATA:  Fever EXAM: CHEST - 2 VIEW COMPARISON:  09/03/2021 FINDINGS: Right IJ dialysis catheter tip med right atrium. Midline trachea. Normal heart size. Left costophrenic angle  minimally excluded the frontal radiograph. Small bilateral pleural effusions. No pneumothorax. Improved interstitial edema with minimal pulmonary venous congestion remaining. Persistent mild bibasilar atelectasis. IMPRESSION: Improved interstitial edema with mild pulmonary venous congestion remaining. Small bilateral pleural effusions with adjacent atelectasis. Electronically Signed   By: Abigail Miyamoto M.D.   On: 09/06/2021 13:49   DG Chest 2 View  Result Date: 08/23/2021 CLINICAL DATA:  Near syncope. EXAM: CHEST - 2 VIEW COMPARISON:  None. FINDINGS: The lungs are clear without focal pneumonia, edema, pneumothorax or pleural effusion. Cardiopericardial silhouette is at upper limits of normal for size. The visualized bony structures of the thorax show no acute abnormality. Telemetry leads overlie the chest. IMPRESSION: No active cardiopulmonary disease. Electronically Signed   By: Misty Stanley M.D.   On: 08/23/2021 09:15   NM Pulmonary Perfusion  Result Date: 08/23/2021 CLINICAL DATA:  PE suspected, shortness of breath EXAM: NUCLEAR MEDICINE PERFUSION LUNG SCAN TECHNIQUE: Perfusion images were obtained in multiple projections after intravenous injection of radiopharmaceutical. Ventilation scans intentionally deferred if perfusion scan and chest x-ray adequate for interpretation during COVID 19 epidemic. RADIOPHARMACEUTICALS:  4.2 mCi Tc-27mMAA IV COMPARISON:  Same-day chest radiographs FINDINGS: Normal, homogeneous pulmonary perfusion. No suspicious filling defects. IMPRESSION: Very low probability examination for pulmonary embolism by modified perfusion only PIOPED criteria (PE absent). Electronically Signed   By: AEddie CandleM.D.   On: 08/23/2021 15:59   IR Angiogram Renal Left Selective  INDICATION: 70year old male with history of acute kidney injury of uncertain etiology status post ultrasound-guided right renal biopsy on 08/28/2021. Since biopsy, the patient has experienced gross hematuria with  associated acute anemia.   EXAM: 1. Ultrasound-guided vascular access of the right internal jugular vein. 2. Temporary hemodialysis catheter placement. 3. Ultrasound-guided vascular access of the right common femoral artery. 4. Selective catheterization and angiography of the right renal artery. 5. Sub selective catheterization angiography of right inferior polar and arcuate artery branches. 6. Coil embolization of right inferior pole arcuate artery branch.   MEDICATIONS: None.   ANESTHESIA/SEDATION: Moderate (conscious) sedation was employed during this procedure. A total of Versed 3 mg and Fentanyl 50 mcg was administered intravenously.   Moderate Sedation Time: 66 minutes. The patient's level of consciousness and vital signs were monitored continuously by radiology nursing throughout the procedure under my direct supervision.   CONTRAST:  233mOMNIPAQUE IOHEXOL 350 MG/ML SOLN, 5077mMNIPAQUE IOHEXOL 350 MG/ML SOLN   FLUOROSCOPY TIME:  Fluoroscopy Time: 10.6 minutes, (811 mGy).   COMPLICATIONS: None immediate.   PROCEDURE: Informed consent was obtained from the patient following explanation of the procedure, risks, benefits and alternatives. The patient understands, agrees and consents for the procedure. All questions were addressed. A time out was performed prior to the initiation of the procedure. Maximal barrier sterile technique utilized including caps, mask, sterile gowns, sterile gloves, large sterile drape, hand hygiene, and chlorhexidine prep.   Preprocedure ultrasound evaluation of the right internal jugular vein demonstrated a patent and compressible vein free of internal echoes. Procedure was planned. Subdermal Local anesthesia was provided 1% lidocaine. A small skin nick was made. Under direct ultrasound visualization, a 21 gauge micropuncture needle was directed into the internal jugular vein. An image was captured and stored in the permanent record. A micropuncture set was inserted and exchanged for  a J wire which was positioned in  the inferior vena cava. Serial dilation was performed followed by placement of a 12.5 French, 24 cm Trialysis catheter. The catheter tip was positioned in the right atrium. Each lumen flushed and aspirated appropriately. The dialysis ports were locked with appropriate volume of heparin dwell. The middle central venous port was then used for sedation for the remainder of the procedure. The catheter was secured with a 0 silk retention suture. A sterile bandage was applied.   Preprocedure ultrasound evaluation of the right groin was performed which demonstrated a patent right common femoral artery. The procedure was planned. Subdermal Local anesthesia was provided with 1% lidocaine. A small skin nick was made. Under direct ultrasound visualization, a 21 gauge micropuncture needle was directed into the common femoral artery. An ultrasound image was captured and stored in the permanent record. A micro puncture set was inserted and a limited right lower extremity angiogram was performed which demonstrated appropriate puncture site for closure device use. A J wire was directed to the abdominal aorta and the micropuncture set was exchanged for a 5 [place] vascular sheath. A 5 French C2 catheter was then directed into the right renal ostium. Right renal angiogram was performed. The single main renal artery was patent. 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   A straight lantern microcatheter and 0.014" soft synchro wire was then inserted and directed into the inferior polar branch. Repeat angiogram was performed in the sub selective location which was significant for multifocal 2-4 mm pseudoaneurysm formation, abnormal truncation and irregularity of the arcuate and interlobular  branches, an early arteriovenous shunting. The inferior polar arcuate branch was selected further. Coil embolization was performed with multiple low profile Penumbra Ruby coils ranging from 2-3 mm in diameter. Completion right inferior renal angiogram was performed which demonstrated appropriate embolization of the targeted vessels without persistent arteriovenous fistula or vessel irregularity.   The catheters were removed. The right common femoral artery was then closed with a 6 [place] Angio-Seal device. Distal pulses were unchanged. The patient tolerated the procedure well was transferred back to the floor in good condition.   IMPRESSION: 1. 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 2. Sub selective coil embolization of right inferior polar arcuate artery branch. 3. Successful placement of right internal jugular, 24 French Trialysis catheter with the catheter tip in the right atrium. The catheter is ready for immediate use.   Ruthann Cancer, MD   [facility]   [facility]     Electronically Signed   By: Ruthann Cancer M.D.   On: 08/31/2021 09:29    IR Fluoro Guide CV Line Right  Result Date: 08/31/2021 INDICATION: 70 year old male with history of acute kidney injury of uncertain etiology status post ultrasound-guided right renal biopsy on 08/28/2021. Since biopsy, the patient has experienced gross hematuria with associated acute anemia. EXAM: 1. Ultrasound-guided vascular access of the right internal jugular vein. 2. Temporary hemodialysis catheter placement. 3. Ultrasound-guided vascular access of the right common femoral artery. 4. Selective catheterization and angiography of the right renal artery. 5. Sub selective catheterization angiography of right inferior polar and arcuate artery branches. 6. Coil embolization of  right inferior pole arcuate artery branch. MEDICATIONS: None. ANESTHESIA/SEDATION: Moderate (conscious) sedation  was employed during this procedure. A total of Versed 3 mg and Fentanyl 50 mcg was administered intravenously. Moderate Sedation Time: 66 minutes. The patient's level of consciousness and vital signs were monitored continuously by radiology nursing throughout the procedure under my direct supervision. CONTRAST:  60m OMNIPAQUE IOHEXOL 350 MG/ML SOLN, 581mOMNIPAQUE IOHEXOL 350 MG/ML SOLN FLUOROSCOPY TIME:  Fluoroscopy Time: 10.6 minutes, (811 mGy). COMPLICATIONS: None immediate. PROCEDURE: Informed consent was obtained from the patient following explanation of the procedure, risks, benefits and alternatives. The patient understands, agrees and consents for the procedure. All questions were addressed. A time out was performed prior to the initiation of the procedure. Maximal barrier sterile technique utilized including caps, mask, sterile gowns, sterile gloves, large sterile drape, hand hygiene, and chlorhexidine prep. Preprocedure ultrasound evaluation of the right internal jugular vein demonstrated a patent and compressible vein free of internal echoes. Procedure was planned. Subdermal Local anesthesia was provided 1% lidocaine. A small skin nick was made. Under direct ultrasound visualization, a 21 gauge micropuncture needle was directed into the internal jugular vein. An image was captured and stored in the permanent record. A micropuncture set was inserted and exchanged for a J wire which was positioned in the inferior vena cava. Serial dilation was performed followed by placement of a 12.5 French, 24 cm Trialysis catheter. The catheter tip was positioned in the right atrium. Each lumen flushed and aspirated appropriately. The dialysis ports were locked with appropriate volume of heparin dwell. The middle central venous port was then used for sedation for the remainder of the procedure. The catheter  was secured with a 0 silk retention suture. A sterile bandage was applied. Preprocedure ultrasound evaluation of the right groin was performed which demonstrated a patent right common femoral artery. The procedure was planned. Subdermal Local anesthesia was provided with 1% lidocaine. A small skin nick was made. Under direct ultrasound visualization, a 21 gauge micropuncture needle was directed into the common femoral artery. An ultrasound image was captured and stored in the permanent record. A micro puncture set was inserted and a limited right lower extremity angiogram was performed which demonstrated appropriate puncture site for closure device use. A J wire was directed to the abdominal aorta and the micropuncture set was exchanged for a 5 [place] sheath. A 5 French C2 catheter was then directed into the right renal ostium. Right renal angiogram was performed. The single main renal artery was patent. 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 A straight lantern microcatheter and 0.014" soft synchro wire was then inserted and directed into the inferior polar branch. Repeat angiogram was performed in the sub selective location which was significant for multifocal 2-4 mm pseudoaneurysm formation, abnormal truncation and irregularity of the arcuate and interlobular branches, an early arteriovenous shunting. The inferior polar arcuate branch was selected further. Coil embolization was performed with multiple low profile Penumbra Ruby coils ranging from 2-3 mm in diameter. Completion right inferior renal angiogram was performed which demonstrated appropriate embolization of the targeted vessels without persistent arteriovenous fistula or vessel irregularity. The catheters were removed. The right common  femoral artery was then closed with a 6 [place]-Seal device. Distal pulses were unchanged. The patient tolerated the procedure well was transferred back to the floor in good condition. IMPRESSION: 1. 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 2. Sub selective coil embolization of right inferior polar arcuate artery branch. 3. Successful placement of right internal jugular, 24 French Trialysis catheter with the catheter tip in the right atrium. The catheter is ready for immediate use. Ruthann Cancer, MD [facility] Radiology Electronically Signed   By: Ruthann Cancer M.D.   On: 08/31/2021 09:29   IR US Guide Vasc Access Right  Result Date: 08/31/2021 INDICATION: 70 year old male with history of acute kidney injury of uncertain etiology status post ultrasound-guided right renal biopsy on 08/28/2021. Since biopsy, the patient has experienced gross hematuria with associated acute anemia. EXAM: 1. Ultrasound-guided vascular access of the right internal jugular vein. 2. Temporary hemodialysis catheter placement. 3. Ultrasound-guided vascular access of the right common femoral artery. 4. Selective catheterization and angiography of the right renal artery. 5. Sub selective catheterization angiography of right inferior polar and arcuate artery branches. 6. Coil embolization of right inferior pole arcuate artery branch. MEDICATIONS: None. ANESTHESIA/SEDATION: Moderate (conscious) sedation was employed during this procedure. A total of Versed 3 mg and Fentanyl 50 mcg was administered intravenously. Moderate Sedation Time: 66 minutes. The patient's level of consciousness and vital signs were monitored continuously by radiology nursing throughout the procedure under my direct supervision. CONTRAST:  78m OMNIPAQUE IOHEXOL 350 MG/ML  SOLN, 568mOMNIPAQUE IOHEXOL 350 MG/ML SOLN FLUOROSCOPY TIME:  Fluoroscopy Time: 10.6 minutes, (811 mGy). COMPLICATIONS: None immediate. PROCEDURE: Informed consent was obtained from the patient following explanation of the procedure, risks, benefits and alternatives. The patient understands, agrees and consents for the procedure. All questions were addressed. A time out was performed prior to the initiation of the procedure. Maximal barrier sterile technique utilized including caps, mask, sterile gowns, sterile gloves, large sterile drape, hand hygiene, and chlorhexidine prep. Preprocedure ultrasound evaluation of the right internal jugular vein demonstrated a patent and compressible vein free of internal echoes. Procedure was planned. Subdermal Local anesthesia was provided 1% lidocaine. A small skin nick was made. Under direct ultrasound visualization, a 21 gauge micropuncture needle was directed into the internal jugular vein. An image was captured and stored in the permanent record. A micropuncture set was inserted and exchanged for a J wire which was positioned in the inferior vena cava. Serial dilation was performed followed by placement of a 12.5 French, 24 cm Trialysis catheter. The catheter tip was positioned in the right atrium. Each lumen flushed and aspirated appropriately. The dialysis ports were locked with appropriate volume of heparin dwell. The middle central venous port was then used for sedation for the remainder of the procedure. The catheter was secured with a 0 silk retention suture. A sterile bandage was applied. Preprocedure ultrasound evaluation of the right groin was performed which demonstrated a patent right common femoral artery. The procedure was planned. Subdermal Local anesthesia was provided with 1% lidocaine. A small skin nick was made. Under direct ultrasound visualization, a 21 gauge micropuncture needle was directed into the common femoral artery. An ultrasound image was  captured and stored in the permanent record. A micro puncture set was inserted and a limited right lower extremity angiogram was performed which demonstrated appropriate puncture site for closure device use. A J wire was directed to the abdominal aorta and the micropuncture set was exchanged for a 5 [place] sheath. A 5 French C2 catheter was then directed into the right renal ostium. Right renal angiogram was performed.  The single main renal artery was patent. 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 A straight lantern microcatheter and 0.014" soft synchro wire was then inserted and directed into the inferior polar branch. Repeat angiogram was performed in the sub selective location which was significant for multifocal 2-4 mm pseudoaneurysm formation, abnormal truncation and irregularity of the arcuate and interlobular branches, an early arteriovenous shunting. The inferior polar arcuate branch was selected further. Coil embolization was performed with multiple low profile Penumbra Ruby coils ranging from 2-3 mm in diameter. Completion right inferior renal angiogram was performed which demonstrated appropriate embolization of the targeted vessels without persistent arteriovenous fistula or vessel irregularity. The catheters were removed. The right common femoral artery was then closed with a 6 [place] Angio-Seal device. Distal pulses were unchanged. The patient tolerated the procedure well was transferred back to the floor in good condition. IMPRESSION: 1. 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 2. Sub selective coil embolization of right inferior polar  arcuate artery branch. 3. Successful placement of right internal jugular, 24 French Trialysis catheter with the catheter tip in the right atrium. The catheter is ready for immediate use. Ruthann Cancer, MD [facility] Radiology Electronically Signed   By: Ruthann Cancer M.D.   On: 08/31/2021 09:29   IR US Guide Vasc Access Right  Result Date: 08/31/2021 INDICATION: 70 year old male with history of acute kidney injury of uncertain etiology status post ultrasound-guided right renal biopsy on 08/28/2021. Since biopsy, the patient has experienced gross hematuria with associated acute anemia. EXAM: 1. Ultrasound-guided vascular access of the right internal jugular vein. 2. Temporary hemodialysis catheter placement. 3. Ultrasound-guided vascular access of the right common femoral artery. 4. Selective catheterization and angiography of the right renal artery. 5. Sub selective catheterization angiography of right inferior polar and arcuate artery branches. 6. Coil embolization of right inferior pole arcuate artery branch. MEDICATIONS: None. ANESTHESIA/SEDATION: Moderate (conscious) sedation was employed during this procedure. A total of Versed 3 mg and Fentanyl 50 mcg was administered intravenously. Moderate Sedation Time: 66 minutes. The patient's level of consciousness and vital signs were monitored continuously by radiology nursing throughout the procedure under my direct supervision. CONTRAST:  32m OMNIPAQUE IOHEXOL 350 MG/ML SOLN, 524mOMNIPAQUE IOHEXOL 350 MG/ML SOLN FLUOROSCOPY TIME:  Fluoroscopy Time: 10.6 minutes, (811 mGy). COMPLICATIONS: None immediate. PROCEDURE: Informed consent was obtained from the patient following explanation of the procedure, risks, benefits and alternatives. The patient understands, agrees and consents for the procedure. All questions were addressed. A time out was performed prior to the initiation of the procedure. Maximal barrier sterile  technique utilized including caps, mask, sterile gowns, sterile gloves, large sterile drape, hand hygiene, and chlorhexidine prep. Preprocedure ultrasound evaluation of the right internal jugular vein demonstrated a patent and compressible vein free of internal echoes. Procedure was planned. Subdermal Local anesthesia was provided 1% lidocaine. A small skin nick was made. Under direct ultrasound visualization, a 21 gauge micropuncture needle was directed into the internal jugular vein. An image was captured and stored in the permanent record. A micropuncture set was inserted and exchanged for a J wire which was positioned in the inferior vena cava. Serial dilation was performed followed by placement of  a 12.5 [place], 24 cm Trialysis catheter. The catheter tip was positioned in the right atrium. Each lumen flushed and aspirated appropriately. The dialysis ports were locked with appropriate volume of heparin dwell. The middle central venous port was then used for sedation for the remainder of the procedure. The catheter was secured with a 0 silk retention suture. A sterile bandage was applied. Preprocedure ultrasound evaluation of the right groin was performed which demonstrated a patent right common femoral artery. The procedure was planned. Subdermal Local anesthesia was provided with 1% lidocaine. A small skin nick was made. Under direct ultrasound visualization, a 21 gauge micropuncture needle was directed into the common femoral artery. An ultrasound image was captured and stored in the permanent record. A micro puncture set was inserted and a limited right lower extremity angiogram was performed which demonstrated appropriate puncture site for closure device use. A J wire was directed to the abdominal aorta and the micropuncture set was exchanged for a 5 [place] vascular sheath. A 5 French C2 catheter was then directed into the right renal ostium. Right renal angiogram was performed. The single main renal artery  was patent. 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 A straight lantern microcatheter and 0.014" soft synchro wire was then inserted and directed into the inferior polar branch. Repeat angiogram was performed in the sub selective location which was significant for multifocal 2-4 mm pseudoaneurysm formation, abnormal truncation and irregularity of the arcuate and interlobular branches, an early arteriovenous shunting. The inferior polar arcuate branch was selected further. Coil embolization was performed with multiple low profile Penumbra Ruby coils ranging from 2-3 mm in diameter. Completion right inferior renal angiogram was performed which demonstrated appropriate embolization of the targeted vessels without persistent arteriovenous fistula or vessel irregularity. The catheters were removed. The right common femoral artery was then closed with a 6 [place] Angio-Seal device. Distal pulses were unchanged. The patient tolerated the procedure well was transferred back to the floor in good condition. IMPRESSION: 1. 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 2. Sub selective coil embolization of right inferior polar arcuate artery branch. 3. Successful placement of right internal jugular, 24 French Trialysis catheter with the catheter tip in the right atrium. The catheter is ready for immediate use. Ruthann Cancer, MD [facility] Radiology Electronically Signed   By: Ruthann Cancer M.D.   On: 08/31/2021 09:29   DG Chest Port 1 View  Result Date: 09/03/2021 CLINICAL DATA:  Shortness of breath EXAM: PORTABLE CHEST 1 VIEW  COMPARISON:  08/23/2021 FINDINGS: Right dialysis catheter in place with the tip in the right atrium. Heart is normal size. Mild vascular congestion and interstitial prominence throughout the lungs, likely mild edema. No effusions or acute bony abnormality. IMPRESSION: Suspect mild pulmonary edema. Electronically Signed   By: Rolm Baptise M.D.   On: 09/03/2021 09:40   DG Bone Survey Met  Result Date: 09/08/2021 CLINICAL DATA:  Left hip pain. EXAM: METASTATIC BONE SURVEY COMPARISON:  None. FINDINGS: No metastatic lesions identified. No cause for left hip pain identified. IMPRESSION: No acute abnormalities identified. No metastatic lesions. No cause for hip pain noted. Electronically Signed   By: Dorise Bullion III M.D.  On: 09/08/2021 14:32   MR CARDIAC MORPHOLOGY WO CONTRAST  Result Date: 09/10/2021 CLINICAL DATA:  Concern for cardiac amyloidosis. EXAM: CARDIAC MRI TECHNIQUE: The patient was scanned on a 1.5 Tesla GE magnet. A dedicated cardiac coil was used. Functional imaging was done using Fiesta sequences. 2,3, and 4 chamber views were done to assess for RWMA's. Modified Simpson's rule using a short axis stack was used to calculate an ejection fraction on a dedicated work Conservation officer, nature. T1 and T2 sequences done. No contrast due to AKI and unstable renal function. CONTRAST:  None FINDINGS: Limited images of the lung fields showed small bilateral pleural effusions. Small inferior pericardial effusion. Normal left ventricular size with moderate LV hypertrophy. Moderate diffuse hypokinesis with EF 38%. Normal right ventricular size with mild-moderate dysfunction, EF 36%. The aortic valve is trileaflet with no significant stenosis or regurgitation. Normal right atrial size. Mild left atrial enlargement. No significant mitral regurgitation. Measurements: T1 1217 T2 53 LVEDV 134 mL LVSV 51 mL LVEF 38% RVEDV 98 mL RVSV 35 mL RVEF 36% IMPRESSION: 1. Normal LV size with moderate LV hypertrophy. EF  38% with diffuse hypokinesis. 2.  Normal RV size with EF 36%. 3.  T1 elevated, T2 borderline elevated. 4.  Small inferior pericardial effusion. 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 Dalton Mclean Electronically Signed   By: Loralie Champagne M.D.   On: 09/10/2021 17:55   CT BONE MARROW BIOPSY & ASPIRATION  Result Date: 09/05/2021 CLINICAL DATA:  Amyloid kidney EXAM: CT GUIDED DEEP ILIAC BONE ASPIRATION AND CORE BIOPSY TECHNIQUE: Patient was placed prone on the CT gantry and limited axial scans through the pelvis were obtained. Appropriate skin entry site was identified. Skin site was marked, prepped with chlorhexidine, draped in usual sterile fashion, and infiltrated locally with 1% lidocaine. Intravenous Fentanyl 26mg and Versed 257mwere administered as conscious sedation during continuous monitoring of the patient's level of consciousness and physiological / cardiorespiratory status by the radiology RN, with a total moderate sedation time of 10 minutes. Under CT fluoroscopic guidance an 11-gauge Cook trocar bone needle was advanced into the right iliac bone just lateral to the sacroiliac joint. Once needle tip position was confirmed, core and aspiration samples were obtained, submitted to pathology for approval. Post procedure scans show no hematoma or fracture. Patient tolerated procedure well. COMPLICATIONS: COMPLICATIONS none IMPRESSION: 1. Technically successful CT guided right iliac bone core and aspiration biopsy. Electronically Signed   By: D Lucrezia Europe.D.   On: 09/05/2021 10:48   ECHOCARDIOGRAM COMPLETE  Result Date: 09/08/2021    ECHOCARDIOGRAM REPORT   Patient Name:   ALYVES FODORate of Exam: 09/08/2021 Medical Rec #:  [id number]  Height:       70.0 in Accession #:    [id number] Weight:       156.5 lb Date of Birth:  7/13-Mar-1951   BSA:          1.881 m  Patient Age:    [age]     BP:           130/78 mmHg Patient Gender: M            HR:           75 bpm. Exam Location:  Inpatient Procedure: 2D Echo, Cardiac Doppler and Color Doppler Indications:    Bacteremia R78.81  History:        Patient has prior history of Echocardiogram examinations, most                 recent 08/24/2021. Risk Factors:Current Smoker. Generalized                 weakness/shortness of breath-found to have AKI and severe                 normocytic anemia.  Sonographer:    Alvino Chapel RCS Referring Phys: [id number] [place]  1. Left ventricular ejection fraction, by estimation, is 45 to 50%. The left ventricle has mildly decreased function. The left ventricle demonstrates global hypokinesis. There is moderate concentric left ventricular hypertrophy. Left ventricular diastolic parameters are consistent with Grade II diastolic dysfunction (pseudonormalization).  2. Right ventricular systolic function is normal. The right ventricular size is normal. Tricuspid regurgitation signal is inadequate for assessing PA pressure.  3. Left atrial size was moderately dilated.  4. A small to moderate pericardial effusion is present. The pericardial effusion is anterior to the right ventricle. There is no evidence of cardiac tamponade.  5. The mitral valve is grossly normal. Mild mitral valve regurgitation.  6. The aortic valve is tricuspid. There is mild thickening of the aortic valve. Aortic valve regurgitation is not visualized.  7. The inferior vena cava is normal in size with greater than 50% respiratory variability, suggesting right atrial pressure of 3 mmHg.  8. No obvious valvular vegetations. Comparison(s): Prior images reviewed side by side. LVEF mildly reduced and pericardial effusion is new. FINDINGS  Left Ventricle: Left ventricular ejection fraction, by estimation, is 45 to 50%. The left ventricle has mildly decreased function. The left ventricle demonstrates global  hypokinesis. The left ventricular internal cavity size was normal in size. There is  moderate concentric left ventricular hypertrophy. Left ventricular diastolic parameters are consistent with Grade II diastolic dysfunction (pseudonormalization). Right Ventricle: The right ventricular size is normal. No increase in right ventricular wall thickness. Right ventricular systolic function is normal. Tricuspid regurgitation signal is inadequate for assessing PA pressure. Left Atrium: Left atrial size was moderately dilated. Right Atrium: Right atrial size was normal in size. Pericardium: A small pericardial effusion is present. The pericardial effusion is anterior to the right ventricle. There is no evidence of cardiac tamponade. Mitral Valve: The mitral valve is grossly normal. There is mild thickening of the mitral valve leaflet(s). Mild mitral annular calcification. Mild mitral valve regurgitation. Tricuspid Valve: The tricuspid valve is grossly normal. Tricuspid valve regurgitation is trivial. Aortic Valve: The aortic valve is tricuspid. There is mild thickening of the aortic valve. There is mild to moderate aortic valve annular calcification. Aortic valve regurgitation is not visualized. Pulmonic Valve: The pulmonic valve was grossly normal. Pulmonic valve regurgitation is trivial. Aorta: The aortic root is normal in size and structure. Venous: The inferior vena cava is normal in size with greater than 50% respiratory variability, suggesting right atrial pressure of 3 mmHg. IAS/Shunts: No atrial level shunt detected by color flow Doppler.  LEFT VENTRICLE PLAX 2D LVIDd:         5.10 cm  Diastology LVIDs:         3.90 cm  LV e' medial:    3.77 cm/s LV PW:         1.50 cm  LV E/e' medial:  22.9 LV IVS:        1.40 cm  LV e' lateral:   7.18 cm/s LVOT  diam:     2.00 cm  LV E/e' lateral: 12.0 LV SV:         51 LV SV Index:   27 LVOT Area:     3.14 cm  RIGHT VENTRICLE RV S prime:     14.40 cm/s TAPSE (M-mode): 1.9 cm LEFT  ATRIUM              Index       RIGHT ATRIUM           Index LA diam:        4.20 cm  2.23 cm/m  RA Area:     14.50 cm LA Vol (A2C):   104.0 ml 55.29 ml/m RA Volume:   36.00 ml  19.14 ml/m LA Vol (A4C):   64.4 ml  34.24 ml/m LA Biplane Vol: 83.7 ml  44.50 ml/m  AORTIC VALVE LVOT Vmax:   89.00 cm/s LVOT Vmean:  53.600 cm/s LVOT VTI:    0.161 m  AORTA Ao Root diam: 3.50 cm MITRAL VALVE MV Area (PHT): 3.65 cm    SHUNTS MV Decel Time: 208 msec    Systemic VTI:  0.16 m MV E velocity: 86.40 cm/s  Systemic Diam: 2.00 cm MV A velocity: 63.70 cm/s MV E/A ratio:  1.36 Rozann Lesches MD Electronically signed by Rozann Lesches MD Signature Date/Time: 09/08/2021/5:26:36 PM    Final    ECHOCARDIOGRAM COMPLETE  Result Date: 08/24/2021    ECHOCARDIOGRAM REPORT   Patient Name:   DARIYON URQUILLA Date of Exam: 08/24/2021 Medical Rec #:  [id number]    Height:       70.0 in Accession #:    [id number]   Weight:       180.0 lb Date of Birth:  [birth year]     BSA:          1.996 m Patient Age:    54 years     BP:           120/69 mmHg Patient Gender: M            HR:           85 bpm. Exam Location:  Inpatient Procedure: 2D Echo, Cardiac Doppler and Color Doppler Indications:    Abnormal EKG  History:        Patient has no prior history of Echocardiogram examinations.                 COPD, Signs/Symptoms:Dyspnea and Weakness, renal disease,                 anemia, elevated troponin; Risk Factors:Current Smoker.  Sonographer:    Dustin Flock RDCS Referring Phys: [place]  Sonographer Comments: Image acquisition challenging due to COPD. IMPRESSIONS  1. Left ventricular ejection fraction, by estimation, is 55 to 60%. The left ventricle has normal function. The left ventricle has no regional wall motion abnormalities. There is mild concentric left ventricular hypertrophy. Left ventricular diastolic parameters are consistent with Grade I diastolic dysfunction (impaired relaxation).  2. Right ventricular systolic function is  normal. The right ventricular size is normal. Tricuspid regurgitation signal is inadequate for assessing PA pressure.  3. The mitral valve is grossly normal. No evidence of mitral valve regurgitation. No evidence of mitral stenosis.  4. The aortic valve was not well visualized. Aortic valve regurgitation is not visualized. No aortic stenosis is present.  5. The inferior vena cava is normal in size with greater than 50% respiratory variability, suggesting  right atrial pressure of 3 mmHg. Comparison(s): No prior Echocardiogram. FINDINGS  Left Ventricle: Left ventricular ejection fraction, by estimation, is 55 to 60%. The left ventricle has normal function. The left ventricle has no regional wall motion abnormalities. The left ventricular internal cavity size was normal in size. There is  mild concentric left ventricular hypertrophy. Left ventricular diastolic parameters are consistent with Grade I diastolic dysfunction (impaired relaxation). Right Ventricle: The right ventricular size is normal. No increase in right ventricular wall thickness. Right ventricular systolic function is normal. Tricuspid regurgitation signal is inadequate for assessing PA pressure. Left Atrium: Left atrial size was normal in size. Right Atrium: Right atrial size was normal in size. Pericardium: There is no evidence of pericardial effusion. Mitral Valve: The mitral valve is grossly normal. No evidence of mitral valve regurgitation. No evidence of mitral valve stenosis. Tricuspid Valve: The tricuspid valve is normal in structure. Tricuspid valve regurgitation is not demonstrated. No evidence of tricuspid stenosis. Aortic Valve: The aortic valve was not well visualized. Aortic valve regurgitation is not visualized. No aortic stenosis is present. Pulmonic Valve: The pulmonic valve was not well visualized. Pulmonic valve regurgitation is not visualized. Aorta: The aortic root is normal in size and structure. Venous: The inferior vena cava is  normal in size with greater than 50% respiratory variability, suggesting right atrial pressure of 3 mmHg. IAS/Shunts: The atrial septum is grossly normal.  LEFT VENTRICLE PLAX 2D LVIDd:         5.80 cm      Diastology LVIDs:         3.60 cm      LV e' medial:    5.55 cm/s LV PW:         1.30 cm      LV E/e' medial:  14.5 LV IVS:        1.30 cm      LV e' lateral:   6.74 cm/s LVOT diam:     2.40 cm      LV E/e' lateral: 12.0 LV SV:         76 LV SV Index:   38 LVOT Area:     4.52 cm  LV Volumes (MOD) LV vol d, MOD A4C: 124.0 ml LV vol s, MOD A4C: 47.8 ml LV SV MOD A4C:     124.0 ml RIGHT VENTRICLE RV Basal diam:  2.80 cm RV S prime:     10.10 cm/s TAPSE (M-mode): 2.9 cm LEFT ATRIUM             Index       RIGHT ATRIUM           Index LA diam:        3.80 cm 1.90 cm/m  RA Area:     13.10 cm LA Vol (A2C):   31.8 ml 15.93 ml/m RA Volume:   29.70 ml  14.88 ml/m LA Vol (A4C):   33.0 ml 16.53 ml/m LA Biplane Vol: 34.9 ml 17.49 ml/m  AORTIC VALVE LVOT Vmax:   98.30 cm/s LVOT Vmean:  60.300 cm/s LVOT VTI:    0.169 m  AORTA Ao Root diam: 3.30 cm MITRAL VALVE MV Area (PHT): 3.85 cm    SHUNTS MV Decel Time: 197 msec    Systemic VTI:  0.17 m MV E velocity: 80.60 cm/s  Systemic Diam: 2.40 cm MV A velocity: 49.30 cm/s MV E/A ratio:  1.63 [place] Haskell MD Electronically signed by Rudean Haskell MD Signature Date/Time: 08/24/2021/11:38:40 AM  Final    CT Renal Stone Study  Result Date: 08/23/2021 CLINICAL DATA:  Hematuria, new renal failure EXAM: CT ABDOMEN AND PELVIS WITHOUT CONTRAST TECHNIQUE: Multidetector CT imaging of the abdomen and pelvis was performed following the standard protocol without IV contrast. COMPARISON:  06/26/2007 FINDINGS: Lower chest: There is mild, scattered, nonspecific ground-glass and fine nodularity throughout the included bilateral lung bases (series 5, 4). Hepatobiliary: No solid liver abnormality is seen. No gallstones, gallbladder wall thickening, or biliary dilatation.  Pancreas: Unremarkable. No pancreatic ductal dilatation or surrounding inflammatory changes. Spleen: Normal in size without significant abnormality. Adrenals/Urinary Tract: Adrenal glands are unremarkable. Kidneys are normal, without renal calculi, solid lesion, or hydronephrosis. Distended urinary bladder, measuring at least 17.4 cm. Stomach/Bowel: Stomach is within normal limits. Appendix not clearly visualized and may be surgically. No evidence of bowel wall thickening, distention, or inflammatory changes. Descending and sigmoid diverticulosis. Vascular/Lymphatic: Aortic atherosclerosis. No enlarged abdominal or pelvic lymph nodes. Reproductive: No mass or other significant abnormality. Other: Small, fat containing bilateral inguinal hernias no abdominopelvic ascites. Musculoskeletal: No acute or significant osseous findings. IMPRESSION: 1. No evidence of urinary tract calculus or hydronephrosis. 2. Distended urinary bladder, measuring at least 17.4 cm. Correlate for urinary retention. 3. Descending and sigmoid diverticulosis without evidence of acute diverticulitis. 4. Mild, nonspecific scattered ground-glass and fine nodularity throughout the bilateral lung bases, possibly infectious or inflammatory. Aortic Atherosclerosis (ICD10-I70.0). Electronically Signed   By: Eddie Candle M.D.   On: 08/23/2021 16:04   US BIOPSY (KIDNEY)  Result Date: 08/28/2021 INDICATION: Acute kidney injury of uncertain etiology. Please perform image guided biopsy for tissue diagnostic purposes. EXAM: ULTRASOUND GUIDED RENAL BIOPSY COMPARISON:  CT abdomen and pelvis-08/23/2021 MEDICATIONS: None. ANESTHESIA/SEDATION: Fentanyl 100 mcg IV; Versed 2 mg IV Total Moderate Sedation time: 14 minutes; The patient was continuously monitored during the procedure by the interventional radiology nurse under my direct supervision. COMPLICATIONS: None immediate. PROCEDURE: Informed written consent was obtained from the patient after a discussion of  the risks, benefits and alternatives to treatment. The patient understands and consents the procedure. A timeout was performed prior to the initiation of the procedure. Ultrasound scanning was performed of the bilateral flanks. The inferior pole of the right kidney was selected for biopsy due to location and sonographic window. The procedure was planned. The operative site was prepped and draped in the usual sterile fashion. The overlying soft tissues were anesthetized with 1% lidocaine with epinephrine. A 17 gauge core needle biopsy device was advanced into the inferior cortex of the right kidney and 3 core biopsies were obtained under direct ultrasound guidance. Images were saved for documentation purposes. The biopsy device was removed and hemostasis was obtained with manual compression. Post procedural scanning was negative for significant post procedural hemorrhage or additional complication. A dressing was placed. The patient tolerated the procedure well without immediate post procedural complication. IMPRESSION: Technically successful ultrasound guided right renal biopsy. Electronically Signed   By: Sandi Mariscal M.D.   On: 08/28/2021 12:52   IR EMBO ART  VEN HEMORR LYMPH EXTRAV  INC GUIDE ROADMAPPING  Result Date: 08/31/2021 INDICATION: 70 year old male with history of acute kidney injury of uncertain etiology status post ultrasound-guided right renal biopsy on 08/28/2021. Since biopsy, the patient has experienced gross hematuria with associated acute anemia. EXAM: 1. Ultrasound-guided vascular access of the right internal jugular vein. 2. Temporary hemodialysis catheter placement. 3. Ultrasound-guided vascular access of the right common femoral artery. 4. Selective catheterization and angiography of the right renal artery. 5.  Sub selective catheterization angiography of right inferior polar and arcuate artery branches. 6. Coil embolization of right inferior pole arcuate artery branch. MEDICATIONS: None.  ANESTHESIA/SEDATION: Moderate (conscious) sedation was employed during this procedure. A total of Versed 3 mg and Fentanyl 50 mcg was administered intravenously. Moderate Sedation Time: 66 minutes. The patient's level of consciousness and vital signs were monitored continuously by radiology nursing throughout the procedure under my direct supervision. CONTRAST:  56m OMNIPAQUE IOHEXOL 350 MG/ML SOLN, 557mOMNIPAQUE IOHEXOL 350 MG/ML SOLN FLUOROSCOPY TIME:  Fluoroscopy Time: 10.6 minutes, (811 mGy). COMPLICATIONS: None immediate. PROCEDURE: Informed consent was obtained from the patient following explanation of the procedure, risks, benefits and alternatives. The patient understands, agrees and consents for the procedure. All questions were addressed. A time out was performed prior to the initiation of the procedure. Maximal barrier sterile technique utilized including caps, mask, sterile gowns, sterile gloves, large sterile drape, hand hygiene, and chlorhexidine prep. Preprocedure ultrasound evaluation of the right internal jugular vein demonstrated a patent and compressible vein free of internal echoes. Procedure was planned. Subdermal Local anesthesia was provided 1% lidocaine. A small skin nick was made. Under direct ultrasound visualization, a 21 gauge micropuncture needle was directed into the internal jugular vein. An image was captured and stored in the permanent record. A micropuncture set was inserted and exchanged for a J wire which was positioned in the inferior vena cava. Serial dilation was performed followed by placement of a 12.5 French, 24 cm Trialysis catheter. The catheter tip was positioned in the right atrium. Each lumen flushed and aspirated appropriately. The dialysis ports were locked with appropriate volume of heparin dwell. The middle central venous port was then used for sedation for the remainder of the procedure. The catheter was secured with a 0 silk retention suture. A sterile bandage  was applied. Preprocedure ultrasound evaluation of the right groin was performed which demonstrated a patent right common femoral artery. The procedure was planned. Subdermal Local anesthesia was provided with 1% lidocaine. A small skin nick was made. Under direct ultrasound visualization, a 21 gauge micropuncture needle was directed into the common femoral artery. An ultrasound image was captured and stored in the permanent record. A micro puncture set was inserted and a limited right lower extremity angiogram was performed which demonstrated appropriate puncture site for closure device use. A J wire was directed to the abdominal aorta and the micropuncture set was exchanged for a 5 Fr[place]ascular sheath. A 5 French C2 catheter was then directed into the right renal ostium. Right renal angiogram was performed. The single main renal artery was patent. 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 A straight lantern microcatheter and 0.014" soft synchro wire was then inserted and directed into the inferior polar branch. Repeat angiogram was performed in the sub selective location which was significant for multifocal 2-4 mm pseudoaneurysm formation, abnormal truncation and irregularity of the arcuate and interlobular branches, an early arteriovenous shunting. The inferior polar arcuate branch was selected further. Coil embolization was performed with multiple low profile Penumbra Ruby coils ranging from 2-3 mm in diameter. Completion right inferior renal angiogram was performed which demonstrated appropriate embolization of the targeted vessels without persistent arteriovenous fistula or vessel irregularity. The catheters were removed. The right common femoral artery was then closed with a 6 FrPakistanngio-Seal  device. Distal pulses were unchanged. The patient tolerated the procedure well was transferred back to the floor in good condition. IMPRESSION: 1. 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 2. Sub selective coil embolization of right inferior polar arcuate artery branch. 3. Successful placement of right internal jugular, 24 French Trialysis catheter with the catheter tip in the right atrium. The catheter is ready for immediate use. Ruthann Cancer, MD [facility] Radiology Electronically Signed   By: Ruthann Cancer M.D.   On: 08/31/2021 09:29    Labs:  CBC: Recent Labs    09/15/21 0144 09/16/21 0139 09/17/21 0514 09/18/21 0410  WBC 11.5* 11.0* 10.0 12.0*  HGB 9.2* 9.3* 9.3* 9.3*  HCT 28.4* 29.9* 29.4* 28.9*  PLT 437* 458* 377 347    COAGS: Recent Labs    08/24/21 1128 08/28/21 0305 08/29/21 0637 08/30/21 0241  INR 1.6* 1.5* 1.7* 1.5*    BMP: Recent Labs    09/16/21 0139 09/16/21 0737 09/17/21 0514 09/18/21 0410  NA 135 133* 134* 133*  K 5.8* 5.3* 5.1 4.9  CL 101 101 100 98  CO2 21* 23 22 23   GLUCOSE 91 83 91 108*  BUN 42* 39* 41* 45*  CALCIUM 8.2* 8.6* 8.3* 8.6*  CREATININE 5.48* 5.69* 5.91* 6.05*  GFRNONAA 10* 10* 10* 9*    LIVER FUNCTION TESTS: Recent Labs    08/23/21 1037 08/24/21 0055 08/25/21 0758 08/26/21 1036 08/27/21 0029 09/15/21 0144 09/16/21 0139 09/17/21 0514 09/18/21 0410  BILITOT 0.3 0.4  --  0.8  --   --   --   --   --   AST 17 14*  --  14*  --   --   --   --   --   ALT 10 10  --  9  --   --   --   --   --   ALKPHOS 66 57  --  56  --   --   --   --   --   PROT 5.6* 4.8*  --  5.0*  --   --   --   --   --   ALBUMIN 2.2* 1.9*   < > 1.9*   < > 1.6* 1.6* 1.5* 1.6*   < > = values in this interval not displayed.     Assessment and Plan:  70 y.o. male  inpatient. Known to IR. Smoker. History of nephrolithiasis.. No other known medical history. Presented to the ED at [facility] on 9.1.22. with weakness, dyspnea and recently resolved blurry vision. Found to have AKI and hematuria. 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 On 9.9.22 placed a RIJ temp HD catheter and sub selective coil embolization of the right inferior pole of the right inferior polar arcuate artery branch. On 9.14.22 IR performed a bone marrow biopsy. Hospital stay further complicated by sepsis (group B strep). HD temp catheter was removed for line holiday. Team is requesting tunneled HD catheter at this time.   Sodium 133. BUN 45. Cr 8.6. Albumin 1.6, WBC is 12.0. team states they are not concerned about infection. Paatient is afebrile.  All other labs and medications are within acceptable parameters. NKDA. Patient has been NPO since midnight.   Risks and benefits discussed with the patient including, but not limited to bleeding, infection, vascular injury, pneumothorax which may  require chest tube placement, air embolism or even death  All of the patient's questions were answered, patient is agreeable to proceed. Consent signed and in chart.   Thank you for this interesting consult.  I greatly enjoyed meeting Caleb Simmons and look forward to participating in their care.  A copy of this report was sent to the requesting provider on this date.  Electronically Signed: Jacqualine Mau, NP 09/18/2021, 9:53 AM   I spent a total of 40 Minutes 40 Minutes   in face to face in clinical consultation, greater than 50% of which was counseling/coordinating care for tunneled HD catheter.

## 2021-09-18 NOTE — Progress Notes (Signed)
[facility] PROGRESS NOTE    Progress Note  Caleb Simmons  QPY:[id number] DOB: [birth year] DOA: 08/23/2021 PCP: Pcp, No     Brief Narrative:   Caleb Simmons is an 70 y.o. male past medical history of nephrolithiasis with no other medical problems, but he does not see a primary care doctor regularly comes into the ED complaining of weakness was found to be in acute kidney injury and severe normocytic anemia.  Seen by nephrology who recommended renal biopsy done on 08/28/2021 unfortunately developed post renal bleeding with clots requiring an insertion of a CBI he is status post 2 units of packed red blood cells but he continued to bleed so IR performed embolization on 08/30/2021, renal biopsy showed amyloidosis.  Oncology has been consulted.  Also found to have strep agalactiae bacteremia.  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 Status post second chemotherapy treatment on 09/17/2021 oncology on board appreciate assistance. Oncology recommended to wait at least 24 hours before initiating hemodialysis after chemotherapy.  AKI (acute kidney injury) ([place]) secondary to amyloid disease/hyperkalemia: Despite good urine output, his creatinine continues to worsen now he is uremic.  He is on his second dose of chemotherapy despite this his creatinine continues to trend up. His potassium has improved on daily Lokelma. Oncology has also been consulted for plasma cell  myeloma and started on chemotherapy treatment on 09/10/2021 The patient decided to proceed with chemotherapy and dialysis.  Sepsis secondary to strep recollected bacteremia: 2D echo was done that showed no vegetation, but it did show a small drop in his EF. ID was consulted who recommended IV antibiotics for 10 days for which she has completed course in-house.  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 The advanced heart failure team was consulted they recommended a cardiac MRI on 09/10/2021 was suggestive of cardiac amyloid, Cardiology recommended chemotherapy, oncology is on board. No diuretics needed at this time.  Cardiology has signed off.  Gross hematuria/acute urinary retention due to bleeding in the collecting duct of the right kidney post renal biopsy: Foley was discontinued 08/30/2021. He required multiple transfusions. Status post embolization on 08/30/2021. Further management per urology.  Anxiety: Continue Xanax aware that we will not continue Xanax postdischarge, continue trazodone. He is not interested in other medications like SSRI.  Tobacco abuse: Continue nicotine patch.   DVT prophylaxis: lovenxo Family Communication:noen Status is: Inpatient  Remains inpatient appropriate because:Hemodynamically unstable  Dispo: The patient is from: Home              Anticipated d/c is to: Home              Patient currently is not medically stable to d/c.   Difficult to place patient No   Code Status:     Code Status Orders  (From admission, onward)           Start     Ordered  08/23/21 1752  Full code  Continuous        08/23/21 1751           Code Status History     This patient has a current code status but no historical code status.         IV Access:   Peripheral IV   Procedures  and diagnostic studies:   No results found.   Medical Consultants:   None.   Subjective:    Caleb Simmons continues to be significantly nauseated  Objective:    Vitals:   09/17/21 0436 09/17/21 1355 09/17/21 2046 09/18/21 0623  BP: 109/68 110/71 122/75 132/87  Pulse: 84 80 89 75  Resp: 19  18 18   Temp: 98.1 F (36.7 C) 97.6 F (36.4 C) 97.7 F (36.5 C) 97.8 F (36.6 C)  TempSrc: Oral Oral Oral Oral  SpO2: 94%  97% 98%  Weight: 72.3 kg   72.7 kg  Height:       SpO2: 98 % O2 Flow Rate (L/min): 2 L/min   Intake/Output Summary (Last 24 hours) at 09/18/2021 0811 Last data filed at 09/18/2021 0539 Gross per 24 hour  Intake --  Output 1050 ml  Net -1050 ml    Filed Weights   09/16/21 0442 09/17/21 0436 09/18/21 0623  Weight: 72.3 kg 72.3 kg 72.7 kg    Exam: General exam: In no acute distress. Respiratory system: Good air movement and clear to auscultation. Cardiovascular system: S1 & S2 heard, RRR. No JVD. Gastrointestinal system: Abdomen is nondistended, soft and nontender.  Central nervous system: He is awake alert and oriented x3 but at times when talking to him he seems to be confused especially with the conversations he has had with physicians over the last several days. Extremities: No pedal edema. Skin: No rashes, lesions or ulcers Psychiatry: Judgement and insight appear normal. Mood & affect appropriate. Data Reviewed:    Labs: Basic Metabolic Panel: Recent Labs  Lab 09/14/21 0430 09/15/21 0144 09/16/21 0139 09/16/21 0737 09/17/21 0514 09/18/21 0410  NA 135 132* 135 133* 134* 133*  K 4.8 4.8 5.8* 5.3* 5.1 4.9  CL 102 100 101 101 100 98  CO2 24 22 21* 23 22 23   GLUCOSE 99 95 91 83 91 108*  BUN 38* 39* 42* 39* 41* 45*  CREATININE 5.31* 5.37* 5.48* 5.69* 5.91* 6.05*  CALCIUM 8.3* 8.1* 8.2* 8.6* 8.3* 8.6*  PHOS 5.0* 5.3* 5.9*  --  6.5* 6.3*    GFR Estimated Creatinine Clearance: 11.5 mL/min (A) (by C-G formula based on SCr of 6.05 mg/dL  (H)). Liver Function Tests: Recent Labs  Lab 09/14/21 0430 09/15/21 0144 09/16/21 0139 09/17/21 0514 09/18/21 0410  ALBUMIN 1.6* 1.6* 1.6* 1.5* 1.6*    No results for input(s): LIPASE, AMYLASE in the last 168 hours. No results for input(s): AMMONIA in the last 168 hours. Coagulation profile No results for input(s): INR, PROTIME in the last 168 hours. COVID-19 Labs  No results for input(s): DDIMER, FERRITIN, LDH, CRP in the last 72 hours.  Lab Results  Component Value Date   [place] NEGATIVE 08/23/2021    CBC: Recent Labs  Lab 09/14/21 0430 09/15/21 0144 09/16/21 0139 09/17/21 0514 09/18/21 0410  WBC 12.2* 11.5* 11.0* 10.0 12.0*  NEUTROABS 8.1* 7.2 7.2 6.6 9.7*  HGB 9.3* 9.2* 9.3* 9.3* 9.3*  HCT 29.2* 28.4* 29.9* 29.4* 28.9*  MCV 85.1 83.5 85.7 85.0 83.5  PLT 434* 437* 458* 377 347    Cardiac Enzymes:  No results for input(s): CKTOTAL, CKMB, CKMBINDEX, TROPONINI in the last 168 hours. BNP (last 3 results) No results for input(s): PROBNP in the last 8760 hours. CBG: No results for input(s): GLUCAP in the last 168 hours. D-Dimer: No results for input(s): DDIMER in the last 72 hours. Hgb A1c: No results for input(s): HGBA1C in the last 72 hours. Lipid Profile: No results for input(s): CHOL, HDL, LDLCALC, TRIG, CHOLHDL, LDLDIRECT in the last 72 hours. Thyroid function studies: No results for input(s): TSH, T4TOTAL, T3FREE, THYROIDAB in the last 72 hours.  Invalid input(s): FREET3 Anemia work up: No results for input(s): VITAMINB12, FOLATE, FERRITIN, TIBC, IRON, RETICCTPCT in the last 72 hours. Sepsis Labs: Recent Labs  Lab 09/15/21 0144 09/16/21 0139 09/17/21 0514 09/18/21 0410  WBC 11.5* 11.0* 10.0 12.0*    Microbiology No results found for this or any previous visit (from the past 240 hour(s)).    Medications:    acyclovir  200 mg Oral Q12H   feeding supplement (NEPRO CARB STEADY)  237 mL Oral TID BM   folic acid  2 mg Oral Daily    Gerhardt's butt cream   Topical BID   melatonin  5 mg Oral QHS   nicotine  14 mg Transdermal Q24H   pantoprazole  40 mg Oral BID AC   sodium bicarbonate  1,300 mg Oral TID   sodium zirconium cyclosilicate  10 g Oral TID   tamsulosin  0.4 mg Oral Daily   Continuous Infusions:  sodium chloride     sodium chloride 10 mL/hr at 09/17/21 0000   sodium chloride irrigation        LOS: 26 days   Charlynne Cousins  [facility]  09/18/2021, 8:11 AM

## 2021-09-18 NOTE — Telephone Encounter (Addendum)
Oral Oncology Pharmacist Encounter  Received new prescription for oral Cytoxan (cyclophosphamide) for the treatment of AL amyloidosis in conjunction with bortezomib, dexamethasone with addition of daratumumab to next cycle (patient is s/p C1D8 on 09/17/21). Plan for next oral cyclophosphamide dose on cycle 1, day 15, which will be 09/24/21.  Prescription dose and frequency assessed for appropriateness. Dose has been reduced to 75% of traditional starting dose of cyclophosphamide (dose reduced from 300 mg/m2 to ~225 mg/m2) due to significant renal dysfunction. Outpatient prescription rounded to nearest 50 mg dose for ease of administration for patient. Dose for outpatient administration calculates to ~238 mg/m2, which is still within 10% of intended dose. CBC w/ Diff and BMP from 09/18/21 assessed, Scr up now to 6.05 mg/dL, CrCl ~ 11.5 mL/min.   Current medication list in Epic reviewed, no relevant/significant DDIs with cyclophosphamide identified.  Evaluated chart and no patient barriers to medication adherence noted.   Patient agreement for treatment documented in MD note on 09/17/21.  Prescription has been e-scribed to the [facility] for benefits analysis and approval.  Oral Oncology Clinic will continue to follow for insurance authorization, copayment issues, initial counseling and start date.  Leron Croak, PharmD, BCPS Hematology/Oncology Clinical Pharmacist [facility] [phone] 09/18/2021 8:28 AM

## 2021-09-18 NOTE — Progress Notes (Signed)
Patient requested more pain medication after given tramadol. When rechecked patient was sleeping and noted no s/s discomfort.

## 2021-09-19 DIAGNOSIS — D649 Anemia, unspecified: Secondary | ICD-10-CM | POA: Diagnosis not present

## 2021-09-19 DIAGNOSIS — N08 Glomerular disorders in diseases classified elsewhere: Secondary | ICD-10-CM | POA: Diagnosis not present

## 2021-09-19 DIAGNOSIS — N179 Acute kidney failure, unspecified: Secondary | ICD-10-CM | POA: Diagnosis not present

## 2021-09-19 DIAGNOSIS — E854 Organ-limited amyloidosis: Secondary | ICD-10-CM | POA: Diagnosis not present

## 2021-09-19 LAB — HEPATITIS B SURFACE ANTIBODY,QUALITATIVE: Hep B S Ab: NONREACTIVE

## 2021-09-19 LAB — HEPATITIS B SURFACE ANTIGEN: Hepatitis B Surface Ag: NONREACTIVE

## 2021-09-19 MED ORDER — HEPARIN SODIUM (PORCINE) 1000 UNIT/ML IJ SOLN
INTRAMUSCULAR | Status: AC
  Filled 2021-09-19: qty 4

## 2021-09-19 NOTE — Progress Notes (Addendum)
Initial Nutrition Assessment  DOCUMENTATION CODES:   Non-severe (moderate) malnutrition in context of acute illness/injury  INTERVENTION:   Liberalize diet to Regular with 1500 ml fluid restriction. D/C magic cups, patient is not taking. Snacks BID between meals.  NUTRITION DIAGNOSIS:   Moderate Malnutrition related to acute illness (amyloidosis) as evidenced by mild fat depletion, mild muscle depletion.  Ongoing  GOAL:   Patient will meet greater than or equal to 90% of their needs  Progressing  MONITOR:   PO intake, Supplement acceptance  REASON FOR ASSESSMENT:   LOS    ASSESSMENT:   70 yo male admitted with AKI, severe normocytic anemia. Renal biopsy showed amyloidosis. Also found to have strep agalactiae bacteremia. PMH includes nephrolithiasis.  Palliative care team following; patient wants to pursue full aggressive medical treatment.  Patient states that he is feeling nauseous, RN notified. He does not like the magic cup supplements. He has been nauseous on and off and says the only cold thing he can eat is orange sherbet. He has also previously refused Ensure and Boost PO supplements.   S/P R IJ tunneled HD catheter 9/27. S/P HD this AM.   UOP: 1945 ml x 24 hours I/O -37 L since admission  S/P second dose of chemotherapy 9/26. Next chemo planned for 10/3 outpatient if discharged.   Labs reviewed. Na 133, phos 6.3, K 4.9 (down from 5.3 on 9/25)  Medications reviewed and include folic acid, flomax.   Diet changed to renal with 1500 ml fluid restriction this morning. Intake of meals is so poor that patient will likely not exceed K and Phos intake recommendations for HD. Regular diet would offer patient more options to help increase intake. Received verbal order from attending physician for regular diet with 1500 ml fluid restriction.   Diet Order:   Diet Order             Diet regular Room service appropriate? Yes; Fluid consistency: Thin; Fluid  restriction: 1500 mL Fluid  Diet effective now           Diet - low sodium heart healthy                   EDUCATION NEEDS:   Education needs have been addressed  Skin:  Skin Assessment: Reviewed RN Assessment  Last BM:  9/25  Height:   Ht Readings from Last 1 Encounters:  08/23/21 5\' 10"  (1.778 m)    Weight:   Wt Readings from Last 1 Encounters:  09/19/21 71.3 kg    BMI:  Body mass index is 22.55 kg/m.  Estimated Nutritional Needs:   Kcal:  2200-2400  Protein:  110-130 gm  Fluid:  2-2.2 L    Lucas Mallow, RD, LDN, CNSC Please refer to Amion for contact information.

## 2021-09-19 NOTE — Progress Notes (Signed)
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  She is undergoing dialysis 09/07/2021.  We are dialyzing on a as needed basis.  We are coordinating treatment with his chemotherapy.  Last dialysis was 09/07/2021 with 2.4 L removed.  He is making good urine with 1.9 L    09/15/2021    we discussed dialysis and/or palliative medicine and he wishes to proceed with HD if necessary.  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 MRI suggestive of cardiac involvement   Assessment/ Plan:   #Acute kidney injury, nonoliguric, with history of excessive NSAID's use/BC powders every day.  Peaked creatinine level of 6.95. [name]  He underwent IR guided kidney biopsy on 9/6 complicated by postbiopsy bleeding.  Renal biopsy: AL amyloidosis, lambda light chain composition (glomeruli, interstitium, arteries, and arterioles). Marked IF, 59% global glomerular sclerosis and marked arteriosclerosis.     He received a one time treatment of dialysis 9/16. However renal function continues to remain poor. Prognosis is poor, Patient is considering either palliative medicine or dialysis -> very stressed understandably but wants RRT if needed. UOP is  good.   - Has baseline nausea even between chemo treatments with appetite that is not great. D/W pt and we will start RRT as ESRD as this is very possibly uremia. Will also CLIP.   - 1st HD treatment for 09/19/21 AM -> seen on HD and tolerating 2K bath 126/74 RIJ TC (appreciate VIR placing) He's not ready for an access placement bec of cath just being placed yest; will address again tomorrow and if he's ok w it then will place consult to VVS.  Will check duplex in prep for an  access placement and move IV to the right side as he is right arm dominant.  Scheduled  f/u with [facility] with Dr. Senaida Lange Oct 12th at 230pm and will cancel when plans are definitive.  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  Per primary service     #Pulm edema appears to have resolved with good diuresis     #Anemia, appears to be relatively stable with a hemoglobin is unchanged   #History of nephrolithiasis: Followed by urology   #Metabolic acidosis: Appears to have resolved -> stop the oral sodium bicarbonate now that he is on HD.   #Mild hyperkalemia we will continue Lokelma as needed.   Subjective:   Good  UOP, no hypotensive episodes but BP is on the lower side.  Nausea worse after chemo but always has a baseline nausea. Appetite not great either.   Objective:   BP 126/74   Pulse 76   Temp 97.7 F (36.5 C)   Resp 16   Ht _0  (1.778 m)   Wt 71 kg   SpO2 96%   BMI 22.46 kg/m   Intake/Output Summary (Last 24 hours) at 09/19/2021 0908 Last data filed at 09/18/2021 2012 Gross per 24 hour  Intake 100 ml  Output 1480 ml  Net -1380 ml   Weight change:   Physical Exam: General: nad Heart: rrr Lungs: normal wob, cta bl Abdomen: Soft, nontender Extremities: No  LE edema. Neurology: Alert, awake, following commands, no asterixis Vascular Access: Right IJ TC  Imaging: IR Fluoro Guide CV Line Right  Result Date: 09/18/2021 INDICATION: 70 year old male referred for tunneled hemodialysis catheter EXAM: TUNNELED CENTRAL VENOUS HEMODIALYSIS CATHETER PLACEMENT WITH ULTRASOUND AND FLUOROSCOPIC GUIDANCE MEDICATIONS: 2 g Ancef. The antibiotic was given in an appropriate time interval prior to skin puncture. ANESTHESIA/SEDATION: Moderate (conscious) sedation was employed during this procedure. A total of Versed 4.0 mg and Fentanyl 200 mcg was administered  intravenously. Moderate Sedation Time: 20 minutes. The patient's level of consciousness and vital signs were monitored continuously by radiology nursing throughout the procedure under my direct supervision. FLUOROSCOPY TIME:  Fluoroscopy Time: 0 minutes 30 seconds (2 mGy). COMPLICATIONS: None PROCEDURE: Informed written consent was obtained from the patient after a discussion of the risks, benefits, and alternatives to treatment. Questions regarding the procedure were encouraged and answered. The right neck and chest were prepped with chlorhexidine in a sterile fashion, and a sterile drape was applied covering the operative field. Maximum barrier sterile technique with sterile gowns and gloves were used for the procedure. A timeout was performed prior to the initiation of the procedure. Ultrasound survey was performed. Micropuncture kit was utilized to access the right internal jugular vein under direct, real-time ultrasound guidance after the overlying soft tissues were anesthetized with 1% lidocaine with epinephrine. Stab incision was made with 11 blade scalpel. Microwire would not pass centrally. Once we confirmed that the tip of the needle was within the partially occluded IJ, a Nitrex wire was passed into the right heart. The needle was removed and the micro puncture was set was advanced onto the Nitrex wire. Nitrex wire was then exchanged for the standard microwire. The microwire was then marked to measure appropriate internal catheter length. External tunneled length was estimated. A total tip to cuff length of 19 cm was selected. 035 guidewire was advanced to the level of the IVC. Skin and subcutaneous tissues of chest wall below the clavicle were generously infiltrated with 1% lidocaine for local anesthesia. A small stab incision was made with 11 blade scalpel. The selected hemodialysis catheter was tunneled in a retrograde fashion from the anterior chest wall to the venotomy incision. Serial dilation was  performed and then a peel-away sheath was placed. The catheter was then placed through the peel-away sheath with tips ultimately positioned within the superior aspect of the right atrium. Final catheter positioning was confirmed and documented with a spot radiographic image. The catheter aspirates and flushes normally. The catheter was flushed with appropriate volume heparin dwells. The catheter exit site was secured with a 0-Prolene retention suture. Gel-Foam slurry was infused into the soft tissue tract. The venotomy incision was closed Derma bond and sterile dressing. Dressings were applied at the chest wall. Patient tolerated the procedure well and remained hemodynamically stable throughout. No complications were encountered and no significant blood loss encountered. IMPRESSION: Status post image guided placement of right IJ tunneled hemodialysis catheter. Signed, Dulcy Fanny. Dellia Nims, RPVI [facility] Electronically Signed   By: Corrie Mckusick D.O.   On: 09/18/2021 13:03   IR US Guide Vasc Access Right  Result Date: 09/18/2021 INDICATION: 70 year old male referred for tunneled hemodialysis catheter EXAM: TUNNELED CENTRAL VENOUS HEMODIALYSIS CATHETER PLACEMENT WITH ULTRASOUND AND FLUOROSCOPIC GUIDANCE MEDICATIONS: 2 g Ancef. The antibiotic was given in an appropriate time interval prior to skin puncture. ANESTHESIA/SEDATION: Moderate (conscious) sedation was employed during this procedure. A total of Versed 4.0 mg  and Fentanyl 200 mcg was administered intravenously. Moderate Sedation Time: 20 minutes. The patient's level of consciousness and vital signs were monitored continuously by radiology nursing throughout the procedure under my direct supervision. FLUOROSCOPY TIME:  Fluoroscopy Time: 0 minutes 30 seconds (2 mGy). COMPLICATIONS: None PROCEDURE: Informed written consent was obtained from the patient after a discussion of the risks, benefits, and  alternatives to treatment. Questions regarding the procedure were encouraged and answered. The right neck and chest were prepped with chlorhexidine in a sterile fashion, and a sterile drape was applied covering the operative field. Maximum barrier sterile technique with sterile gowns and gloves were used for the procedure. A timeout was performed prior to the initiation of the procedure. Ultrasound survey was performed. Micropuncture kit was utilized to access the right internal jugular vein under direct, real-time ultrasound guidance after the overlying soft tissues were anesthetized with 1% lidocaine with epinephrine. Stab incision was made with 11 blade scalpel. Microwire would not pass centrally. Once we confirmed that the tip of the needle was within the partially occluded IJ, a Nitrex wire was passed into the right heart. The needle was removed and the micro puncture was set was advanced onto the Nitrex wire. Nitrex wire was then exchanged for the standard microwire. The microwire was then marked to measure appropriate internal catheter length. External tunneled length was estimated. A total tip to cuff length of 19 cm was selected. 035 guidewire was advanced to the level of the IVC. Skin and subcutaneous tissues of chest wall below the clavicle were generously infiltrated with 1% lidocaine for local anesthesia. A small stab incision was made with 11 blade scalpel. The selected hemodialysis catheter was tunneled in a retrograde fashion from the anterior chest wall to the venotomy incision. Serial dilation was performed and then a peel-away sheath was placed. The catheter was then placed through the peel-away sheath with tips ultimately positioned within the superior aspect of the right atrium. Final catheter positioning was confirmed and documented with a spot radiographic image. The catheter aspirates and flushes normally. The catheter was flushed with appropriate volume heparin dwells. The catheter exit site  was secured with a 0-Prolene retention suture. Gel-Foam slurry was infused into the soft tissue tract. The venotomy incision was closed Derma bond and sterile dressing. Dressings were applied at the chest wall. Patient tolerated the procedure well and remained hemodynamically stable throughout. No complications were encountered and no significant blood loss encountered. IMPRESSION: Status post image guided placement of right IJ tunneled hemodialysis catheter. Signed, Dulcy Fanny. Dellia Nims, RPVI [facility] Electronically Signed   By: Corrie Mckusick D.O.   On: 09/18/2021 13:03    Labs: BMET Recent Labs  Lab 09/13/21 0425 09/14/21 0430 09/15/21 0144 09/16/21 0139 09/16/21 0737 09/17/21 0514 09/18/21 0410  NA 135 135 132* 135 133* 134* 133*  K 4.8 4.8 4.8 5.8* 5.3* 5.1 4.9  CL 101 102 100 101 101 100 98  CO2 _0 21* _1 GLUCOSE 96 99 95 91 83 91 108*  BUN 39* 38* 39* 42* 39* 41* 45*  CREATININE 5.09* 5.31* 5.37* 5.48* 5.69* 5.91* 6.05*  CALCIUM 8.1* 8.3* 8.1* 8.2* 8.6* 8.3* 8.6*  PHOS 4.5 5.0* 5.3* 5.9*  --  6.5* 6.3*   CBC Recent Labs  Lab 09/15/21 0144 09/16/21 0139 09/17/21 0514 09/18/21 0410  WBC 11.5* 11.0* 10.0 12.0*  NEUTROABS 7.2 7.2 6.6 9.7*  HGB 9.2* 9.3* 9.3* 9.3*  HCT 28.4*  29.9* 29.4* 28.9*  MCV 83.5 85.7 85.0 83.5  PLT 437* 458* 377 347    Medications:     acyclovir  200 mg Oral Q12H   Chlorhexidine Gluconate Cloth  6 each Topical Q0600   feeding supplement (NEPRO CARB STEADY)  237 mL Oral TID BM   folic acid  2 mg Oral Daily   Gerhardt's butt cream   Topical BID   heparin injection (subcutaneous)  5,000 Units Subcutaneous Q8H   melatonin  5 mg Oral QHS   nicotine  14 mg Transdermal Q24H   pantoprazole  40 mg Oral BID AC   sodium bicarbonate  1,300 mg Oral TID   sodium zirconium cyclosilicate  10 g Oral TID   tamsulosin  0.4 mg Oral Daily      Otelia Santee, MD 09/19/2021, 9:08 AM

## 2021-09-19 NOTE — Progress Notes (Addendum)
[facility] PROGRESS NOTE    Progress Note  FREDIE Simmons  YQI:[id number] DOB: 01-Sep-1951 DOA: 08/23/2021 PCP: Pcp, No    Brief Narrative:   Caleb Simmons is an 70 y.o. male with no significant medical history, but he does not see a primary care doctor regularly presented to the ED with weakness, noted to have acute kidney injury and severe normocytic anemia . 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  Oncology was consulted, started on chemotherapy 9/20.  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 09/10/2021 cMRI showed possible cardiac amyloid  Assessment/Plan:   AL amyloid/plasma cell myeloma: -Diagnosed on renal biopsy, oncology consulted and following, started chemo CyBorD on 9/20, second dose on 9/26 -Per oncology if discharged will follow-up Monday 10/3 for next chemo -Counts are stable, continue to monitor -On acyclovir for prophylaxis  AKI (acute kidney injury) ([place]) secondary to amyloid disease/hyperkalemia: Despite good urine output, his creatinine continued to worsen  -Completed second round of chemo on 10/26  -Potassium improved on daily [facility]  -Nephrology following, previously had HD on 9/26 and then as needed -Seen by palliative care as well,  patient wishes to pursue full aggressive medical treatment -Per renal, restarted HD 9/28, also CLIP planned  Sepsis  Group B strep bacteremia: -Repeat blood cultures are negative, TTE without vegetation, bacteremia cleared quickly, followed by infectious disease, recommended 10 days of IV penicillin, this was completed on 9/25  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 He required multiple transfusions. Status post embolization on 08/30/2021. -reolved  Anxiety: Continue Xanax, continue trazodone. He is not interested in other medications like SSRI.  Tobacco abuse: Continue nicotine patch.   DVT prophylaxis: Hep SQ Full code Family Communication:no family at bedside, discussed patient in detail Status is: Inpatient  Remains inpatient appropriate because:Hemodynamically unstable  Dispo: The patient is from: Home              Anticipated d/c is to: Home              Patient currently is not medically stable to d/c.   Difficult to place patient No   IV Access:   Peripheral IV   Procedures and diagnostic studies:   IR Fluoro Guide CV Line Right  Result Date: 09/18/2021 INDICATION: 70 year old male referred for tunneled hemodialysis catheter EXAM: TUNNELED CENTRAL  VENOUS HEMODIALYSIS CATHETER PLACEMENT WITH ULTRASOUND AND FLUOROSCOPIC GUIDANCE MEDICATIONS: 2 g Ancef. The antibiotic was given in an  appropriate time interval prior to skin puncture. ANESTHESIA/SEDATION: Moderate (conscious) sedation was employed during this procedure. A total of Versed 4.0 mg and Fentanyl 200 mcg was administered intravenously. Moderate Sedation Time: 20 minutes. The patient's level of consciousness and vital signs were monitored continuously by radiology nursing throughout the procedure under my direct supervision. FLUOROSCOPY TIME:  Fluoroscopy Time: 0 minutes 30 seconds (2 mGy). COMPLICATIONS: None PROCEDURE: Informed written consent was obtained from the patient after a discussion of the risks, benefits, and alternatives to treatment. Questions regarding the procedure were encouraged and answered. The right neck and chest were prepped with chlorhexidine in a sterile fashion, and a sterile drape was applied covering the operative field. Maximum barrier sterile technique with sterile gowns and gloves were used for the procedure. A timeout was performed prior to the initiation of the procedure. Ultrasound survey was performed. Micropuncture kit was utilized to access the right internal jugular vein under direct, real-time ultrasound guidance after the overlying soft tissues were anesthetized with 1% lidocaine with epinephrine. Stab incision was made with 11 blade scalpel. Microwire would not pass centrally. Once we confirmed that the tip of the needle was within the partially occluded IJ, a Nitrex wire was passed into the right heart. The needle was removed and the micro puncture was set was advanced onto the Nitrex wire. Nitrex wire was then exchanged for the standard microwire. The microwire was then marked to measure appropriate internal catheter length. External tunneled length was estimated. A total tip to cuff length of 19 cm was selected. 035 guidewire was advanced to the level of the IVC. Skin and subcutaneous tissues of chest wall below the clavicle were generously infiltrated with 1% lidocaine for local anesthesia. A  small stab incision was made with 11 blade scalpel. The selected hemodialysis catheter was tunneled in a retrograde fashion from the anterior chest wall to the venotomy incision. Serial dilation was performed and then a peel-away sheath was placed. The catheter was then placed through the peel-away sheath with tips ultimately positioned within the superior aspect of the right atrium. Final catheter positioning was confirmed and documented with a spot radiographic image. The catheter aspirates and flushes normally. The catheter was flushed with appropriate volume heparin dwells. The catheter exit site was secured with a 0-Prolene retention suture. Gel-Foam slurry was infused into the soft tissue tract. The venotomy incision was closed Derma bond and sterile dressing. Dressings were applied at the chest wall. Patient tolerated the procedure well and remained hemodynamically stable throughout. No complications were encountered and no significant blood loss encountered. IMPRESSION: Status post image guided placement of right IJ tunneled hemodialysis catheter. Signed, Dulcy Fanny. Dellia Nims, RPVI [facility] Radiology Electronically Signed   By: Corrie Mckusick D.O.   On: 09/18/2021 13:03   IR US Guide Vasc Access Right  Result Date: 09/18/2021 INDICATION: 70 year old male referred for tunneled hemodialysis catheter EXAM: TUNNELED CENTRAL VENOUS HEMODIALYSIS CATHETER PLACEMENT WITH ULTRASOUND AND FLUOROSCOPIC GUIDANCE MEDICATIONS: 2 g Ancef. The antibiotic was given in an appropriate time interval prior to skin puncture. ANESTHESIA/SEDATION: Moderate (conscious) sedation was employed during this procedure. A total of Versed 4.0 mg and Fentanyl 200 mcg was administered intravenously. Moderate Sedation Time: 20 minutes. The patient's level of consciousness and vital signs were monitored continuously by radiology nursing throughout the procedure under my direct supervision.  FLUOROSCOPY TIME:  Fluoroscopy Time: 0 minutes 30 seconds (2 mGy). COMPLICATIONS: None PROCEDURE: Informed written consent was obtained from the patient after a discussion of the risks, benefits, and alternatives to treatment. Questions regarding the procedure were encouraged and answered. The right neck and chest were prepped with chlorhexidine in a sterile fashion, and a sterile drape was applied covering the operative field. Maximum barrier sterile technique with sterile gowns and gloves were used for the procedure. A timeout was performed prior to the initiation of the procedure. Ultrasound survey was performed. Micropuncture kit was utilized to access the right internal jugular vein under direct, real-time ultrasound guidance after the overlying soft tissues were anesthetized with 1% lidocaine with epinephrine. Stab incision was made with 11 blade scalpel. Microwire would not pass centrally. Once we confirmed that the tip of the needle was within the partially occluded IJ, a Nitrex wire was passed into the right heart. The needle was removed and the micro puncture was set was advanced onto the Nitrex wire. Nitrex wire was then exchanged for the standard microwire. The microwire was then marked to measure appropriate internal catheter length. External tunneled length was estimated. A total tip to cuff length of 19 cm was selected. 035 guidewire was advanced to the level of the IVC. Skin and subcutaneous tissues of chest wall below the clavicle were generously infiltrated with 1% lidocaine for local anesthesia. A small stab incision was made with 11 blade scalpel. The selected hemodialysis catheter was tunneled in a retrograde fashion from the anterior chest wall to the venotomy incision. Serial dilation was performed and then a peel-away sheath was placed. The catheter was then placed through the peel-away sheath with tips ultimately positioned within the superior aspect of the right atrium. Final catheter  positioning was confirmed and documented with a spot radiographic image. The catheter aspirates and flushes normally. The catheter was flushed with appropriate volume heparin dwells. The catheter exit site was secured with a 0-Prolene retention suture. Gel-Foam slurry was infused into the soft tissue tract. The venotomy incision was closed Derma bond and sterile dressing. Dressings were applied at the chest wall. Patient tolerated the procedure well and remained hemodynamically stable throughout. No complications were encountered and no significant blood loss encountered. IMPRESSION: Status post image guided placement of right IJ tunneled hemodialysis catheter. Signed, Dulcy Fanny. Dellia Nims, RPVI [facility] Radiology Electronically Signed   By: Corrie Mckusick D.O.   On: 09/18/2021 13:03     Medical Consultants:   None.   Subjective:    RONDAL VANDEVELDE reports mild soreness at the site of his IJ HD catheter, denies any nausea or vomiting, breathing is okay  Objective:    Vitals:   09/19/21 0900 09/19/21 0930 09/19/21 1000 09/19/21 1030  BP: 112/69 99/62 109/67 116/70  Pulse:      Resp: 11 11 15 12   Temp:      TempSrc:      SpO2:      Weight:      Height:       SpO2: 96 % O2 Flow Rate (L/min): 2 L/min   Intake/Output Summary (Last 24 hours) at 09/19/2021 1149 Last data filed at 09/18/2021 2012 Gross per 24 hour  Intake 100 ml  Output 1480 ml  Net -1380 ml   Filed Weights   09/17/21 0436 09/18/21 0623 09/19/21 0833  Weight: 72.3 kg 72.7 kg 71 kg    Exam General exam: Chronically ill pleasant male sitting up in bed, AAOx3,  no distress HEENT: Right IJ HD catheter noted CVS: S1-S2, regular rate rhythm Lungs: Clear bilaterally Abdomen: Soft, nontender, bowel sounds present Extremities: No edema Skin: No rash on exposed skin  Data Reviewed:    Labs: Basic Metabolic Panel: Recent Labs  Lab 09/14/21 0430 09/15/21 0144  09/16/21 0139 09/16/21 0737 09/17/21 0514 09/18/21 0410  NA 135 132* 135 133* 134* 133*  K 4.8 4.8 5.8* 5.3* 5.1 4.9  CL 102 100 101 101 100 98  CO2 24 22 21* 23 22 23   GLUCOSE 99 95 91 83 91 108*  BUN 38* 39* 42* 39* 41* 45*  CREATININE 5.31* 5.37* 5.48* 5.69* 5.91* 6.05*  CALCIUM 8.3* 8.1* 8.2* 8.6* 8.3* 8.6*  PHOS 5.0* 5.3* 5.9*  --  6.5* 6.3*   GFR Estimated Creatinine Clearance: 11.2 mL/min (A) (by C-G formula based on SCr of 6.05 mg/dL (H)). Liver Function Tests: Recent Labs  Lab 09/14/21 0430 09/15/21 0144 09/16/21 0139 09/17/21 0514 09/18/21 0410  ALBUMIN 1.6* 1.6* 1.6* 1.5* 1.6*   No results for input(s): LIPASE, AMYLASE in the last 168 hours. No results for input(s): AMMONIA in the last 168 hours. Coagulation profile No results for input(s): INR, PROTIME in the last 168 hours. COVID-19 Labs  No results for input(s): DDIMER, FERRITIN, LDH, CRP in the last 72 hours.  Lab Results  Component Value Date   [place] NEGATIVE 08/23/2021    CBC: Recent Labs  Lab 09/14/21 0430 09/15/21 0144 09/16/21 0139 09/17/21 0514 09/18/21 0410  WBC 12.2* 11.5* 11.0* 10.0 12.0*  NEUTROABS 8.1* 7.2 7.2 6.6 9.7*  HGB 9.3* 9.2* 9.3* 9.3* 9.3*  HCT 29.2* 28.4* 29.9* 29.4* 28.9*  MCV 85.1 83.5 85.7 85.0 83.5  PLT 434* 437* 458* 377 347   Cardiac Enzymes: No results for input(s): CKTOTAL, CKMB, CKMBINDEX, TROPONINI in the last 168 hours. BNP (last 3 results) No results for input(s): PROBNP in the last 8760 hours. CBG: No results for input(s): GLUCAP in the last 168 hours. D-Dimer: No results for input(s): DDIMER in the last 72 hours. Hgb A1c: No results for input(s): HGBA1C in the last 72 hours. Lipid Profile: No results for input(s): CHOL, HDL, LDLCALC, TRIG, CHOLHDL, LDLDIRECT in the last 72 hours. Thyroid function studies: No results for input(s): TSH, T4TOTAL, T3FREE, THYROIDAB in the last 72 hours.  Invalid input(s): FREET3 Anemia work up: No results for  input(s): VITAMINB12, FOLATE, FERRITIN, TIBC, IRON, RETICCTPCT in the last 72 hours. Sepsis Labs: Recent Labs  Lab 09/15/21 0144 09/16/21 0139 09/17/21 0514 09/18/21 0410  WBC 11.5* 11.0* 10.0 12.0*   Microbiology No results found for this or any previous visit (from the past 240 hour(s)).    Medications:    acyclovir  200 mg Oral Q12H   Chlorhexidine Gluconate Cloth  6 each Topical Q0600   feeding supplement (NEPRO CARB STEADY)  237 mL Oral TID BM   folic acid  2 mg Oral Daily   Gerhardt's butt cream   Topical BID   heparin injection (subcutaneous)  5,000 Units Subcutaneous Q8H   heparin sodium (porcine)       melatonin  5 mg Oral QHS   nicotine  14 mg Transdermal Q24H   pantoprazole  40 mg Oral BID AC   tamsulosin  0.4 mg Oral Daily   Continuous Infusions:  sodium chloride 10 mL/hr at 09/17/21 0000   ceFAZolin 2 g (09/18/21 1205)   sodium chloride irrigation        LOS: 27 days   Madlynn Lundeen  Broadus John  [facility]  09/19/2021, 11:49 AM

## 2021-09-19 NOTE — Progress Notes (Signed)
Patient requested that his morning labs be drawn in dialysis to help with the discomfort of being stuck by lab. Will continue to monitor.     Donah Driver, RN

## 2021-09-19 NOTE — Progress Notes (Signed)
HEMATOLOGY-ONCOLOGY PROGRESS NOTE  SUBJECTIVE: Caleb Simmons had a dialysis catheter placed yesterday and restarted hemodialysis today.  He tolerated dialysis well.  He still has intermittent nausea and Zofran helps.  He is able to get out of bed and use bathroom, he has good urine output.  Appetite slightly low, some food smell cause nausea but he is eating.   REVIEW OF SYSTEMS:   Constitutional: Denies fevers, chills  Eyes: Denies blurriness of vision Ears, nose, mouth, throat, and face: Denies mucositis or sore throat Respiratory: Denies cough, dyspnea or wheezes Cardiovascular: Denies palpitation, chest discomfort Gastrointestinal: Reports nausea, denies vomiting Skin: Denies abnormal skin rashes Lymphatics: Denies new lymphadenopathy or easy bruising Neurological:Denies numbness, tingling or new weaknesses Behavioral/Psych: Mood is stable, no new changes  Extremities: No lower extremity edema All other systems were reviewed with the patient and are negative.  I have reviewed the past medical history, past surgical history, social history and family history with the patient and they are unchanged from previous note.   PHYSICAL EXAMINATION: ECOG PERFORMANCE STATUS: 2 - Symptomatic, <50% confined to bed  Vitals:   09/19/21 1107 09/19/21 1545  BP: 131/70 98/63  Pulse:  85  Resp: 15 16  Temp: 98.2 F (36.8 C) 98.1 F (36.7 C)  SpO2:  95%   Filed Weights   09/18/21 0623 09/19/21 0833 09/19/21 1107  Weight: 160 lb 4.8 oz (72.7 kg) 156 lb 8.4 oz (71 kg) 157 lb 3 oz (71.3 kg)    Intake/Output from previous day: 09/27 0701 - 09/28 0700 In: 100 [IV Piggyback:100] Out: 1760 [Urine:1760]  GENERAL:alert, no distress and comfortable, dialysis catheter in the right subclavicular  SKIN: skin color, texture, turgor are normal, no rashes or significant lesions EYES: normal, Conjunctiva are pink and non-injected, sclera clear OROPHARYNX:no exudate, no erythema and lips, buccal mucosa, and  tongue normal  LUNGS: clear to auscultation and percussion with normal breathing effort HEART: regular rate & rhythm and no murmurs and no lower extremity edema ABDOMEN:abdomen soft, non-tender and normal bowel sounds NEURO: alert & oriented x 3 with fluent speech, no focal motor/sensory deficits  LABORATORY DATA:  I have reviewed the data as listed CMP Latest Ref Rng & Units 09/18/2021 09/17/2021 09/16/2021  Glucose 70 - 99 mg/dL 108(H) 91 83  BUN 8 - 23 mg/dL 45(H) 41(H) 39(H)  Creatinine 0.61 - 1.24 mg/dL 6.05(H) 5.91(H) 5.69(H)  Sodium 135 - 145 mmol/L 133(L) 134(L) 133(L)  Potassium 3.5 - 5.1 mmol/L 4.9 5.1 5.3(H)  Chloride 98 - 111 mmol/L 98 100 101  CO2 22 - 32 mmol/L 23 22 23   Calcium 8.9 - 10.3 mg/dL 8.6(L) 8.3(L) 8.6(L)  Total Protein 6.5 - 8.1 g/dL - - -  Total Bilirubin 0.3 - 1.2 mg/dL - - -  Alkaline Phos 38 - 126 U/L - - -  AST 15 - 41 U/L - - -  ALT 0 - 44 U/L - - -    Lab Results  Component Value Date   WBC 12.0 (H) 09/18/2021   HGB 9.3 (L) 09/18/2021   HCT 28.9 (L) 09/18/2021   MCV 83.5 09/18/2021   PLT 347 09/18/2021   NEUTROABS 9.7 (H) 09/18/2021    CT ABDOMEN PELVIS WO CONTRAST  Result Date: 08/30/2021 CLINICAL DATA:  Retroperitoneal hematoma, follow up s/p random renal bx with perinephric hematoma development EXAM: CT ABDOMEN AND PELVIS WITHOUT CONTRAST TECHNIQUE: Multidetector CT imaging of the abdomen and pelvis was performed following the standard protocol without IV contrast. COMPARISON:  September  6 FINDINGS: Inferior chest: Trace bilateral pleural effusions with right greater than left compressive subsegmental atelectasis. Hepatobiliary: The liver is normal in size without focal abnormality. No intrahepatic or extrahepatic biliary ductal dilation. The gallbladder appears normal. Spleen: Normal in size without focal abnormality. Pancreas: No pancreatic ductal dilatation or surrounding inflammatory changes. Adrenals/Urinary Tract: Adrenal glands are  unremarkable. The kidneys are normal in size. Tiny perinephric hematoma along the right renal lower pole is unchanged. Hyperdense material in the right collecting system and bladder consistent with blood products, grossly similar. Stomach/Bowel: The stomach, small bowel and large bowel are normal in caliber without abnormal wall thickening or surrounding inflammatory changes. Reproductive: Prostate is unremarkable. Lymphatic: No enlarged lymph nodes in the abdomen or pelvis. Vasculature: The abdominal aorta is normal in caliber. Aortic atherosclerosis. Other: No abdominopelvic ascites. Musculoskeletal: No aggressive osseous lesions. Degenerative changes at L5-S1. Bone island in the left ilium. IMPRESSION: The small perinephric hematoma along the right renal lower pole is stable, and within expected limits after percutaneous biopsy. However, there remains substantial clot burden within the bladder. Follow-up urology recommendations for management. Electronically Signed   By: Albin Felling M.D.   On: 08/30/2021 14:21   CT ABDOMEN PELVIS WO CONTRAST  Result Date: 08/29/2021 CLINICAL DATA:  Post right-sided renal biopsy, now with hematuria and hypotension. EXAM: CT ABDOMEN AND PELVIS WITHOUT CONTRAST TECHNIQUE: Multidetector CT imaging of the abdomen and pelvis was performed following the standard protocol without IV contrast. COMPARISON:  CT abdomen pelvis-08/23/2021; ultrasound-guided right renal biopsy-earlier same day FINDINGS: The lack of intravenous contrast limits the ability to evaluate solid abdominal organs. Lower chest: Limited visualization of the lower thorax demonstrates interval development of trace bilateral effusion with worsening bibasilar heterogeneous/consolidative opacities, right greater than left. Previously identified nonspecific ground-glass opacities within the imaged lung bases is not seen on the present examination though there is mild residual intraseptal thickening. Normal heart size.  Trace amount of pericardial fluid, unchanged presumably physiologic. There is diffuse decreased attenuation intra cardiac blood pool suggestive of anemia. Hepatobiliary: Normal hepatic contour. Apparent high density material within the gallbladder could represent biliary sludge. No definitive gallbladder wall thickening or pericholecystic stranding on this noncontrast examination. No ascites. Pancreas: Normal noncontrast appearance of the pancreas. Spleen: Normal noncontrast appearance of the spleen. Adrenals/Urinary Tract: There is a very tiny (approximately 2.7 x 1.6 x 1.2 cm) perinephric hematoma about the inferior pole of the right kidney (axial image 44, series 3; coronal image 57, series 6), with minimal amount of adjacent perinephric stranding. High-density material is seen within the right renal collecting system and ureter with moderate to large amount of layering high-density material within urinary bladder, findings compatible with hemorrhage into the collecting system and bladder. Mild associated right-sided pelviectasis and ureterectasis. Normal noncontrast appearance of the left kidney. No evidence of left-sided nephrolithiasis or urinary obstruction. Normal noncontrast appearance of the bilateral adrenal glands. Stomach/Bowel: Scattered minimal colonic diverticulosis without evidence of superimposed acute diverticulitis on this noncontrast examination. Normal appearance of the terminal ileum. The appendix is not visualized compatible with provided operative history. No discrete areas of bowel wall thickening on this noncontrast examination. No pneumoperitoneum, pneumatosis or portal venous gas. Vascular/Lymphatic: Moderate amount of atherosclerotic plaque within normal caliber abdominal aorta. Scattered retroperitoneal lymph nodes are numerous though individually not enlarged by size criteria with index left sided periaortic lymph node measuring 0.7 cm in greatest short axis diameter (image 31, series  3), presumably reactive in etiology. No bulky retroperitoneal, mesenteric, pelvic or inguinal lymphadenopathy  on this noncontrast examination Reproductive: Dystrophic calcifications within normal sized prostate gland. Trace amount of fluid within the pelvic cul-de-sac. Other: Small bilateral mesenteric fat containing inguinal hernias, left greater than right. Minimal amount of subcutaneous edema about the midline of the low back. Presumed shrapnel is seen within the right lower abdominal/pelvic ventral abdominal wall. Musculoskeletal: No acute or aggressive osseous abnormalities. Mild-to-moderate multilevel lumbar spine DDD, worse at L4-L5 and L5-S1 with disc space height loss, endplate irregularity and small posteriorly directed disc osteophyte complexes at these locations. Mild degenerative change the bilateral hips with joint space loss, subchondral sclerosis and osteophytosis, right greater than left. IMPRESSION: 1. Post right-sided renal biopsy complicated by tiny (approximately 2.7 cm) perinephric hematoma and bleeding into the right renal collecting system including moderate to large-sized clot within the urinary bladder and associated mild right-sided pelviectasis and ureterectasis. Consideration for initiation of continuous bladder irrigation could be performed as indicated. 2. Trace bilateral effusions with associated bibasilar opacities, right greater than left, likely atelectasis. 3. Colonic diverticulosis without evidence superimposed acute diverticulitis. 4.  Aortic Atherosclerosis (ICD10-I70.0). Critical Value/emergent results were called by telephone at the time of interpretation on 08/28/2021 at 5:04 pm to provider [facility] , who verbally acknowledged these results. Electronically Signed   By: Sandi Mariscal M.D.   On: 08/29/2021 10:38   DG Chest 2 View  Result Date: 09/06/2021 CLINICAL DATA:  Fever EXAM: CHEST - 2 VIEW COMPARISON:  09/03/2021 FINDINGS: Right IJ dialysis catheter tip med right  atrium. Midline trachea. Normal heart size. Left costophrenic angle minimally excluded the frontal radiograph. Small bilateral pleural effusions. No pneumothorax. Improved interstitial edema with minimal pulmonary venous congestion remaining. Persistent mild bibasilar atelectasis. IMPRESSION: Improved interstitial edema with mild pulmonary venous congestion remaining. Small bilateral pleural effusions with adjacent atelectasis. Electronically Signed   By: Abigail Miyamoto M.D.   On: 09/06/2021 13:49   DG Chest 2 View  Result Date: 08/23/2021 CLINICAL DATA:  Near syncope. EXAM: CHEST - 2 VIEW COMPARISON:  None. FINDINGS: The lungs are clear without focal pneumonia, edema, pneumothorax or pleural effusion. Cardiopericardial silhouette is at upper limits of normal for size. The visualized bony structures of the thorax show no acute abnormality. Telemetry leads overlie the chest. IMPRESSION: No active cardiopulmonary disease. Electronically Signed   By: Misty Stanley M.D.   On: 08/23/2021 09:15   NM Pulmonary Perfusion  Result Date: 08/23/2021 CLINICAL DATA:  PE suspected, shortness of breath EXAM: NUCLEAR MEDICINE PERFUSION LUNG SCAN TECHNIQUE: Perfusion images were obtained in multiple projections after intravenous injection of radiopharmaceutical. Ventilation scans intentionally deferred if perfusion scan and chest x-ray adequate for interpretation during COVID 19 epidemic. RADIOPHARMACEUTICALS:  4.2 mCi Tc-38mMAA IV COMPARISON:  Same-day chest radiographs FINDINGS: Normal, homogeneous pulmonary perfusion. No suspicious filling defects. IMPRESSION: Very low probability examination for pulmonary embolism by modified perfusion only PIOPED criteria (PE absent). Electronically Signed   By: AEddie CandleM.D.   On: 08/23/2021 15:59   IR Angiogram Renal Left Selective  INDICATION: 70year old male with history of acute kidney injury of uncertain etiology status post ultrasound-guided right renal biopsy on 08/28/2021.  Since biopsy, the patient has experienced gross hematuria with associated acute anemia.   EXAM: 1. Ultrasound-guided vascular access of the right internal jugular vein. 2. Temporary hemodialysis catheter placement. 3. Ultrasound-guided vascular access of the right common femoral artery. 4. Selective catheterization and angiography of the right renal artery. 5. Sub selective catheterization angiography of right inferior polar and arcuate artery branches. 6. Coil  embolization of right inferior pole arcuate artery branch.   MEDICATIONS: None.   ANESTHESIA/SEDATION: Moderate (conscious) sedation was employed during this procedure. A total of Versed 3 mg and Fentanyl 50 mcg was administered intravenously.   Moderate Sedation Time: 66 minutes. The patient's level of consciousness and vital signs were monitored continuously by radiology nursing throughout the procedure under my direct supervision.   CONTRAST:  53m OMNIPAQUE IOHEXOL 350 MG/ML SOLN, 577mOMNIPAQUE IOHEXOL 350 MG/ML SOLN   FLUOROSCOPY TIME:  Fluoroscopy Time: 10.6 minutes, (811 mGy).   COMPLICATIONS: None immediate.   PROCEDURE: Informed consent was obtained from the patient following explanation of the procedure, risks, benefits and alternatives. The patient understands, agrees and consents for the procedure. All questions were addressed. A time out was performed prior to the initiation of the procedure. Maximal barrier sterile technique utilized including caps, mask, sterile gowns, sterile gloves, large sterile drape, hand hygiene, and chlorhexidine prep.   Preprocedure ultrasound evaluation of the right internal jugular vein demonstrated a patent and compressible vein free of internal echoes. Procedure was planned. Subdermal Local anesthesia was provided 1% lidocaine. A small skin nick was made. Under direct ultrasound visualization, a 21 gauge micropuncture needle was directed into the internal jugular vein. An image was captured and stored in the  permanent record. A micropuncture set was inserted and exchanged for a J wire which was positioned in the inferior vena cava. Serial dilation was performed followed by placement of a 12.5 French, 24 cm Trialysis catheter. The catheter tip was positioned in the right atrium. Each lumen flushed and aspirated appropriately. The dialysis ports were locked with appropriate volume of heparin dwell. The middle central venous port was then used for sedation for the remainder of the procedure. The catheter was secured with a 0 silk retention suture. A sterile bandage was applied.   Preprocedure ultrasound evaluation of the right groin was performed which demonstrated a patent right common femoral artery. The procedure was planned. Subdermal Local anesthesia was provided with 1% lidocaine. A small skin nick was made. Under direct ultrasound visualization, a 21 gauge micropuncture needle was directed into the common femoral artery. An ultrasound image was captured and stored in the permanent record. A micro puncture set was inserted and a limited right lower extremity angiogram was performed which demonstrated appropriate puncture site for closure device use. A J wire was directed to the abdominal aorta and the micropuncture set was exchanged for a 5 Fr[place] sheath. A 5 French C2 catheter was then directed into the right renal ostium. Right renal angiogram was performed. The single main renal artery was patent. 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   A straight lantern microcatheter and 0.014" soft synchro wire was then inserted and directed into the inferior polar branch. Repeat angiogram was performed in the sub selective location which was significant for multifocal 2-4 mm pseudoaneurysm formation,  abnormal truncation and irregularity of the arcuate and interlobular branches, an early arteriovenous shunting. The inferior polar arcuate branch was selected further. Coil embolization was performed with multiple low profile Penumbra Ruby coils ranging from 2-3 mm in diameter. Completion right inferior renal angiogram was performed which demonstrated appropriate embolization of the targeted vessels without persistent arteriovenous fistula or vessel irregularity.   The catheters were removed. The right common femoral artery was  then closed with a 6 [place] Angio-Seal device. Distal pulses were unchanged. The patient tolerated the procedure well was transferred back to the floor in good condition.   IMPRESSION: 1. 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 2. Sub selective coil embolization of right inferior polar arcuate artery branch. 3. Successful placement of right internal jugular, 24 French Trialysis catheter with the catheter tip in the right atrium. The catheter is ready for immediate use.   Ruthann Cancer, MD   [facility]   [facility]     Electronically Signed   By: Ruthann Cancer M.D.   On: 08/31/2021 09:29    IR Fluoro Guide CV Line Right  Result Date: 09/18/2021 INDICATION: 70 year old male referred for tunneled hemodialysis catheter EXAM: TUNNELED CENTRAL VENOUS HEMODIALYSIS CATHETER PLACEMENT WITH ULTRASOUND AND FLUOROSCOPIC GUIDANCE MEDICATIONS: 2 g Ancef. The antibiotic was given in an appropriate time interval prior to skin puncture. ANESTHESIA/SEDATION: Moderate (conscious) sedation was employed during this procedure. A total of Versed 4.0 mg and Fentanyl 200 mcg was administered intravenously. Moderate Sedation Time: 20 minutes. The patient's level of consciousness and vital signs were monitored  continuously by radiology nursing throughout the procedure under my direct supervision. FLUOROSCOPY TIME:  Fluoroscopy Time: 0 minutes 30 seconds (2 mGy). COMPLICATIONS: None PROCEDURE: Informed written consent was obtained from the patient after a discussion of the risks, benefits, and alternatives to treatment. Questions regarding the procedure were encouraged and answered. The right neck and chest were prepped with chlorhexidine in a sterile fashion, and a sterile drape was applied covering the operative field. Maximum barrier sterile technique with sterile gowns and gloves were used for the procedure. A timeout was performed prior to the initiation of the procedure. Ultrasound survey was performed. Micropuncture kit was utilized to access the right internal jugular vein under direct, real-time ultrasound guidance after the overlying soft tissues were anesthetized with 1% lidocaine with epinephrine. Stab incision was made with 11 blade scalpel. Microwire would not pass centrally. Once we confirmed that the tip of the needle was within the partially occluded IJ, a Nitrex wire was passed into the right heart. The needle was removed and the micro puncture was set was advanced onto the Nitrex wire. Nitrex wire was then exchanged for the standard microwire. The microwire was then marked to measure appropriate internal catheter length. External tunneled length was estimated. A total tip to cuff length of 19 cm was selected. 035 guidewire was advanced to the level of the IVC. Skin and subcutaneous tissues of chest wall below the clavicle were generously infiltrated with 1% lidocaine for local anesthesia. A small stab incision was made with 11 blade scalpel. The selected hemodialysis catheter was tunneled in a retrograde fashion from the anterior chest wall to the venotomy incision. Serial dilation was performed and then a peel-away sheath was placed. The catheter was then placed through the peel-away sheath with tips  ultimately positioned within the superior aspect of the right atrium. Final catheter positioning was confirmed and documented with a spot radiographic image. The catheter aspirates and flushes normally. The catheter was flushed with appropriate volume heparin dwells. The catheter exit site was secured with a 0-Prolene retention suture. Gel-Foam slurry was infused into the soft tissue tract. The venotomy incision was closed Derma bond and sterile dressing. Dressings were applied at the chest wall. Patient tolerated the procedure well and remained hemodynamically  stable throughout. No complications were encountered and no significant blood loss encountered. IMPRESSION: Status post image guided placement of right IJ tunneled hemodialysis catheter. Signed, Dulcy Fanny. Dellia Nims, RPVI [facility] Electronically Signed   By: Corrie Mckusick D.O.   On: 09/18/2021 13:03   IR Fluoro Guide CV Line Right  Result Date: 08/31/2021 INDICATION: 69 year old male with history of acute kidney injury of uncertain etiology status post ultrasound-guided right renal biopsy on 08/28/2021. Since biopsy, the patient has experienced gross hematuria with associated acute anemia. EXAM: 1. Ultrasound-guided vascular access of the right internal jugular vein. 2. Temporary hemodialysis catheter placement. 3. Ultrasound-guided vascular access of the right common femoral artery. 4. Selective catheterization and angiography of the right renal artery. 5. Sub selective catheterization angiography of right inferior polar and arcuate artery branches. 6. Coil embolization of right inferior pole arcuate artery branch. MEDICATIONS: None. ANESTHESIA/SEDATION: Moderate (conscious) sedation was employed during this procedure. A total of Versed 3 mg and Fentanyl 50 mcg was administered intravenously. Moderate Sedation Time: 66 minutes. The patient's level of consciousness and vital signs were monitored  continuously by radiology nursing throughout the procedure under my direct supervision. CONTRAST:  75m OMNIPAQUE IOHEXOL 350 MG/ML SOLN, 561mOMNIPAQUE IOHEXOL 350 MG/ML SOLN FLUOROSCOPY TIME:  Fluoroscopy Time: 10.6 minutes, (811 mGy). COMPLICATIONS: None immediate. PROCEDURE: Informed consent was obtained from the patient following explanation of the procedure, risks, benefits and alternatives. The patient understands, agrees and consents for the procedure. All questions were addressed. A time out was performed prior to the initiation of the procedure. Maximal barrier sterile technique utilized including caps, mask, sterile gowns, sterile gloves, large sterile drape, hand hygiene, and chlorhexidine prep. Preprocedure ultrasound evaluation of the right internal jugular vein demonstrated a patent and compressible vein free of internal echoes. Procedure was planned. Subdermal Local anesthesia was provided 1% lidocaine. A small skin nick was made. Under direct ultrasound visualization, a 21 gauge micropuncture needle was directed into the internal jugular vein. An image was captured and stored in the permanent record. A micropuncture set was inserted and exchanged for a J wire which was positioned in the inferior vena cava. Serial dilation was performed followed by placement of a 12.5 French, 24 cm Trialysis catheter. The catheter tip was positioned in the right atrium. Each lumen flushed and aspirated appropriately. The dialysis ports were locked with appropriate volume of heparin dwell. The middle central venous port was then used for sedation for the remainder of the procedure. The catheter was secured with a 0 silk retention suture. A sterile bandage was applied. Preprocedure ultrasound evaluation of the right groin was performed which demonstrated a patent right common femoral artery. The procedure was planned. Subdermal Local anesthesia was provided with 1% lidocaine. A small skin nick was made. Under direct  ultrasound visualization, a 21 gauge micropuncture needle was directed into the common femoral artery. An ultrasound image was captured and stored in the permanent record. A micro puncture set was inserted and a limited right lower extremity angiogram was performed which demonstrated appropriate puncture site for closure device use. A J wire was directed to the abdominal aorta and the micropuncture set was exchanged for a 5 Fr[place] sheath. A 5 French C2 catheter was then directed into the right renal ostium. Right renal angiogram was performed. The single main renal artery was patent. About 2 arcuate artery branches in the inferior pole there is abnormal truncation and vessel irregularity with evidence of an early filling  arteriovenous fistula in addition to suggestion of faint filling into the collecting system on delayed imaging. A straight lantern microcatheter and 0.014" soft synchro wire was then inserted and directed into the inferior polar branch. Repeat angiogram was performed in the sub selective location which was significant for multifocal 2-4 mm pseudoaneurysm formation, abnormal truncation and irregularity of the arcuate and interlobular branches, an early arteriovenous shunting. The inferior polar arcuate branch was selected further. Coil embolization was performed with multiple low profile Penumbra Ruby coils ranging from 2-3 mm in diameter. Completion right inferior renal angiogram was performed which demonstrated appropriate embolization of the targeted vessels without persistent arteriovenous fistula or vessel irregularity. The catheters were removed. The right common femoral artery was then closed with a 6 [place] Angio-Seal device. Distal pulses were unchanged. The patient tolerated the procedure well was transferred back to the floor in good condition. IMPRESSION: 1. 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 2. Sub selective coil embolization of right inferior polar arcuate artery branch. 3. Successful placement of right internal jugular, 24 French Trialysis catheter with the catheter tip in the right atrium. The catheter is ready for immediate use. Ruthann Cancer, MD [facility] Radiology Electronically Signed   By: Ruthann Cancer M.D.   On: 08/31/2021 09:29   IR US Guide Vasc Access Right  Result Date: 09/18/2021 INDICATION: 70 year old male referred for tunneled hemodialysis catheter EXAM: TUNNELED CENTRAL VENOUS HEMODIALYSIS CATHETER PLACEMENT WITH ULTRASOUND AND FLUOROSCOPIC GUIDANCE MEDICATIONS: 2 g Ancef. The antibiotic was given in an appropriate time interval prior to skin puncture. ANESTHESIA/SEDATION: Moderate (conscious) sedation was employed during this procedure. A total of Versed 4.0 mg and Fentanyl 200 mcg was administered intravenously. Moderate Sedation Time: 20 minutes. The patient's level of consciousness and vital signs were monitored continuously by radiology nursing throughout the procedure under my direct supervision. FLUOROSCOPY TIME:  Fluoroscopy Time: 0 minutes 30 seconds (2 mGy). COMPLICATIONS: None PROCEDURE: Informed written consent was obtained from the patient after a discussion of the risks, benefits, and alternatives to treatment. Questions regarding the procedure were encouraged and answered. The right neck and chest were prepped with chlorhexidine in a sterile fashion, and a sterile drape was applied covering the operative field. Maximum barrier sterile technique with sterile gowns and gloves were used for the procedure. A timeout was performed prior to the initiation of the procedure. Ultrasound survey was performed. Micropuncture kit was utilized to access the right internal jugular vein under direct, real-time ultrasound guidance after the overlying soft tissues  were anesthetized with 1% lidocaine with epinephrine. Stab incision was made with 11 blade scalpel. Microwire would not pass centrally. Once we confirmed that the tip of the needle was within the partially occluded IJ, a Nitrex wire was passed into the right heart. The needle was removed and the micro puncture was set was advanced onto the Nitrex wire. Nitrex wire was then exchanged for the standard microwire. The microwire was then marked to measure appropriate internal catheter length. External tunneled length was estimated. A total tip to cuff length of 19 cm was selected. 035 guidewire was advanced to the level of the IVC. Skin and subcutaneous tissues of chest wall below the clavicle were generously infiltrated with 1% lidocaine for local anesthesia. A small stab incision was made with 11 blade scalpel. The selected hemodialysis catheter was tunneled in a retrograde fashion from the  anterior chest wall to the venotomy incision. Serial dilation was performed and then a peel-away sheath was placed. The catheter was then placed through the peel-away sheath with tips ultimately positioned within the superior aspect of the right atrium. Final catheter positioning was confirmed and documented with a spot radiographic image. The catheter aspirates and flushes normally. The catheter was flushed with appropriate volume heparin dwells. The catheter exit site was secured with a 0-Prolene retention suture. Gel-Foam slurry was infused into the soft tissue tract. The venotomy incision was closed Derma bond and sterile dressing. Dressings were applied at the chest wall. Patient tolerated the procedure well and remained hemodynamically stable throughout. No complications were encountered and no significant blood loss encountered. IMPRESSION: Status post image guided placement of right IJ tunneled hemodialysis catheter. Signed, Dulcy Fanny. Dellia Nims, RPVI V[facility] and Inter[facility] Radiology Specialists [facility] Radiology  Electronically Signed   By: Corrie Mckusick D.O.   On: 09/18/2021 13:03   IR US Guide Vasc Access Right  Result Date: 08/31/2021 INDICATION: 70 year old male with history of acute kidney injury of uncertain etiology status post ultrasound-guided right renal biopsy on 08/28/2021. Since biopsy, the patient has experienced gross hematuria with associated acute anemia. EXAM: 1. Ultrasound-guided vascular access of the right internal jugular vein. 2. Temporary hemodialysis catheter placement. 3. Ultrasound-guided vascular access of the right common femoral artery. 4. Selective catheterization and angiography of the right renal artery. 5. Sub selective catheterization angiography of right inferior polar and arcuate artery branches. 6. Coil embolization of right inferior pole arcuate artery branch. MEDICATIONS: None. ANESTHESIA/SEDATION: Moderate (conscious) sedation was employed during this procedure. A total of Versed 3 mg and Fentanyl 50 mcg was administered intravenously. Moderate Sedation Time: 66 minutes. The patient's level of consciousness and vital signs were monitored continuously by radiology nursing throughout the procedure under my direct supervision. CONTRAST:  26m OMNIPAQUE IOHEXOL 350 MG/ML SOLN, 565mOMNIPAQUE IOHEXOL 350 MG/ML SOLN FLUOROSCOPY TIME:  Fluoroscopy Time: 10.6 minutes, (811 mGy). COMPLICATIONS: None immediate. PROCEDURE: Informed consent was obtained from the patient following explanation of the procedure, risks, benefits and alternatives. The patient understands, agrees and consents for the procedure. All questions were addressed. A time out was performed prior to the initiation of the procedure. Maximal barrier sterile technique utilized including caps, mask, sterile gowns, sterile gloves, large sterile drape, hand hygiene, and chlorhexidine prep. Preprocedure ultrasound evaluation of the right internal jugular vein demonstrated a patent and compressible vein free of internal echoes.  Procedure was planned. Subdermal Local anesthesia was provided 1% lidocaine. A small skin nick was made. Under direct ultrasound visualization, a 21 gauge micropuncture needle was directed into the internal jugular vein. An image was captured and stored in the permanent record. A micropuncture set was inserted and exchanged for a J wire which was positioned in the inferior vena cava. Serial dilation was performed followed by placement of a 12.5 French, 24 cm Trialysis catheter. The catheter tip was positioned in the right atrium. Each lumen flushed and aspirated appropriately. The dialysis ports were locked with appropriate volume of heparin dwell. The middle central venous port was then used for sedation for the remainder of the procedure. The catheter was secured with a 0 silk retention suture. A sterile bandage was applied. Preprocedure ultrasound evaluation of the right groin was performed which demonstrated a patent right common femoral artery. The procedure was planned. Subdermal Local anesthesia was provided with 1% lidocaine. A small skin nick was made. Under direct ultrasound visualization, a 21 gauge micropuncture  needle was directed into the common femoral artery. An ultrasound image was captured and stored in the permanent record. A micro puncture set was inserted and a limited right lower extremity angiogram was performed which demonstrated appropriate puncture site for closure device use. A J wire was directed to the abdominal aorta and the micropuncture set was exchanged for a 5 [place] vascular sheath. A 5 French C2 catheter was then directed into the right renal ostium. Right renal angiogram was performed. The single main renal artery was patent. 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 A  straight lantern microcatheter and 0.014" soft synchro wire was then inserted and directed into the inferior polar branch. Repeat angiogram was performed in the sub selective location which was significant for multifocal 2-4 mm pseudoaneurysm formation, abnormal truncation and irregularity of the arcuate and interlobular branches, an early arteriovenous shunting. The inferior polar arcuate branch was selected further. Coil embolization was performed with multiple low profile Penumbra Ruby coils ranging from 2-3 mm in diameter. Completion right inferior renal angiogram was performed which demonstrated appropriate embolization of the targeted vessels without persistent arteriovenous fistula or vessel irregularity. The catheters were removed. The right common femoral artery was then closed with a 6 [place] Angio-Seal device. Distal pulses were unchanged. The patient tolerated the procedure well was transferred back to the floor in good condition. IMPRESSION: 1. 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 2. Sub selective coil embolization of right inferior polar arcuate artery branch. 3. Successful placement of right internal jugular, 24 French Trialysis catheter with the catheter tip in the right atrium. The catheter is ready for immediate use. Ruthann Cancer, MD [facility] and Interventional Radiology Specialists [facility] Radiology Electronically Signed   By: Ruthann Cancer M.D.   On: 08/31/2021 09:29   IR US Guide Vasc Access Right  Result Date: 08/31/2021 INDICATION: 70 year old male with history of acute kidney injury of uncertain etiology status post ultrasound-guided right renal biopsy on 08/28/2021. Since biopsy, the patient has experienced gross hematuria with associated acute anemia. EXAM: 1. Ultrasound-guided vascular access of the right internal jugular vein. 2.  Temporary hemodialysis catheter placement. 3. Ultrasound-guided vascular access of the right common femoral artery. 4. Selective catheterization and angiography of the right renal artery. 5. Sub selective catheterization angiography of right inferior polar and arcuate artery branches. 6. Coil embolization of right inferior pole arcuate artery branch. MEDICATIONS: None. ANESTHESIA/SEDATION: Moderate (conscious) sedation was employed during this procedure. A total of Versed 3 mg and Fentanyl 50 mcg was administered intravenously. Moderate Sedation Time: 66 minutes. The patient's level of consciousness and vital signs were monitored continuously by radiology nursing throughout the procedure under my direct supervision. CONTRAST:  58m OMNIPAQUE IOHEXOL 350 MG/ML SOLN, 525mOMNIPAQUE IOHEXOL 350 MG/ML SOLN FLUOROSCOPY TIME:  Fluoroscopy Time: 10.6 minutes, (811 mGy). COMPLICATIONS: None immediate. PROCEDURE: Informed consent was obtained from the patient following explanation of the procedure, risks, benefits and alternatives. The patient understands, agrees and consents for the procedure. All questions were addressed. A time out was performed prior to the initiation of the procedure. Maximal barrier sterile technique utilized including caps, mask, sterile gowns, sterile gloves, large sterile drape, hand hygiene, and chlorhexidine prep. Preprocedure ultrasound evaluation of the right  internal jugular vein demonstrated a patent and compressible vein free of internal echoes. Procedure was planned. Subdermal Local anesthesia was provided 1% lidocaine. A small skin nick was made. Under direct ultrasound visualization, a 21 gauge micropuncture needle was directed into the internal jugular vein. An image was captured and stored in the permanent record. A micropuncture set was inserted and exchanged for a J wire which was positioned in the inferior vena cava. Serial dilation was performed followed by placement of a 12.5  French, 24 cm Trialysis catheter. The catheter tip was positioned in the right atrium. Each lumen flushed and aspirated appropriately. The dialysis ports were locked with appropriate volume of heparin dwell. The middle central venous port was then used for sedation for the remainder of the procedure. The catheter was secured with a 0 silk retention suture. A sterile bandage was applied. Preprocedure ultrasound evaluation of the right groin was performed which demonstrated a patent right common femoral artery. The procedure was planned. Subdermal Local anesthesia was provided with 1% lidocaine. A small skin nick was made. Under direct ultrasound visualization, a 21 gauge micropuncture needle was directed into the common femoral artery. An ultrasound image was captured and stored in the permanent record. A micro puncture set was inserted and a limited right lower extremity angiogram was performed which demonstrated appropriate puncture site for closure device use. A J wire was directed to the abdominal aorta and the micropuncture set was exchanged for a 5 [place] vascular sheath. A 5 French C2 catheter was then directed into the right renal ostium. Right renal angiogram was performed. The single main renal artery was patent. 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 A straight lantern microcatheter and 0.014" soft synchro wire was then inserted and directed into the inferior polar branch. Repeat angiogram was performed in the sub selective location which was significant for multifocal 2-4 mm pseudoaneurysm formation, abnormal truncation and irregularity of the arcuate and interlobular branches, an early arteriovenous shunting. The inferior polar arcuate branch was selected further. Coil embolization was performed  with multiple low profile Penumbra Ruby coils ranging from 2-3 mm in diameter. Completion right inferior renal angiogram was performed which demonstrated appropriate embolization of the targeted vessels without persistent arteriovenous fistula or vessel irregularity. The catheters were removed. The right common femoral artery was then closed with a 6 [place] Angio-Seal device. Distal pulses were unchanged. The patient tolerated the procedure well was transferred back to the floor in good condition. IMPRESSION: 1. 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 2. Sub selective coil embolization of right inferior polar arcuate artery branch. 3. Successful placement of right internal jugular, 24 French Trialysis catheter with the catheter tip in the right atrium. The catheter is ready for immediate use. Ruthann Cancer, MD Vascular and Interventional Radiology Specialists [facility] Radiology Electronically Signed   By: Ruthann Cancer M.D.   On: 08/31/2021 09:29   DG Chest Port 1 View  Result Date: 09/03/2021 CLINICAL DATA:  Shortness of breath EXAM: PORTABLE CHEST 1 VIEW COMPARISON:  08/23/2021 FINDINGS: Right dialysis catheter in place with the tip in the right atrium. Heart is normal size. Mild vascular congestion and interstitial prominence throughout the lungs, likely mild edema. No effusions or acute  bony abnormality. IMPRESSION: Suspect mild pulmonary edema. Electronically Signed   By: Rolm Baptise M.D.   On: 09/03/2021 09:40   DG Bone Survey Met  Result Date: 09/08/2021 CLINICAL DATA:  Left hip pain. EXAM: METASTATIC BONE SURVEY COMPARISON:  None. FINDINGS: No metastatic lesions identified. No cause for left hip pain identified. IMPRESSION: No acute abnormalities identified. No metastatic lesions. No cause for hip pain noted. Electronically Signed   By: Dorise Bullion III M.D.   On: 09/08/2021 14:32   MR CARDIAC MORPHOLOGY WO CONTRAST  Result Date: 09/10/2021 CLINICAL DATA:  Concern for cardiac amyloidosis. EXAM: CARDIAC MRI TECHNIQUE: The patient was scanned on a 1.5 Tesla GE magnet. A dedicated cardiac coil was used. Functional imaging was done using Fiesta sequences. 2,3, and 4 chamber views were done to assess for RWMA's. Modified Simpson's rule using a short axis stack was used to calculate an ejection fraction on a dedicated work Conservation officer, nature. T1 and T2 sequences done. No contrast due to AKI and unstable renal function. CONTRAST:  None FINDINGS: Limited images of the lung fields showed small bilateral pleural effusions. Small inferior pericardial effusion. Normal left ventricular size with moderate LV hypertrophy. Moderate diffuse hypokinesis with EF 38%. Normal right ventricular size with mild-moderate dysfunction, EF 36%. The aortic valve is trileaflet with no significant stenosis or regurgitation. Normal right atrial size. Mild left atrial enlargement. No significant mitral regurgitation. Measurements: T1 1217 T2 53 LVEDV 134 mL LVSV 51 mL LVEF 38% RVEDV 98 mL RVSV 35 mL RVEF 36% IMPRESSION: 1. Normal LV size with moderate LV hypertrophy. EF 38% with diffuse hypokinesis. 2.  Normal RV size with EF 36%. 3.  T1 elevated, T2 borderline elevated. 4.  Small inferior pericardial effusion. 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 Dalton Mclean Electronically Signed   By: Loralie Champagne M.D.   On: 09/10/2021 17:55   CT BONE MARROW BIOPSY & ASPIRATION  Result Date: 09/05/2021 CLINICAL DATA:  Amyloid kidney EXAM: CT GUIDED DEEP ILIAC BONE ASPIRATION AND CORE BIOPSY TECHNIQUE: Patient was placed prone on the CT gantry and limited axial scans through the pelvis were obtained. Appropriate skin entry site was  identified. Skin site was marked, prepped with chlorhexidine, draped in usual sterile fashion, and infiltrated locally with 1% lidocaine. Intravenous Fentanyl 76mg and Versed 261mwere administered as conscious sedation during continuous monitoring of the patient's level of consciousness and physiological / cardiorespiratory status by the radiology RN, with a total moderate sedation time of 10 minutes. Under CT fluoroscopic guidance an 11-gauge Cook trocar bone needle was advanced into the right iliac bone just lateral to the sacroiliac joint. Once needle tip position was confirmed, core and aspiration samples were obtained, submitted to pathology for approval. Post procedure scans show no hematoma or fracture. Patient tolerated procedure well. COMPLICATIONS: COMPLICATIONS none IMPRESSION: 1. Technically successful CT guided right iliac bone core and aspiration biopsy. Electronically Signed   By: D Lucrezia Europe.D.   On: 09/05/2021 10:48   ECHOCARDIOGRAM COMPLETE  Result Date: 09/08/2021    ECHOCARDIOGRAM REPORT   Patient Name:   Caleb SENAate of Exam: 09/08/2021 Medical Rec #:  [id number]  Height:       70.0 in Accession #:    [id number] Weight:       156.5 lb Date of Birth:  7/Nov 26, 1951  BSA:          1.881 m Patient Age:    70 years     BP:           130/78 mmHg Patient Gender: M            HR:           75 bpm. Exam Location:  Inpatient Procedure: 2D Echo, Cardiac Doppler and Color Doppler Indications:    Bacteremia R78.81  History:        Patient has prior history of Echocardiogram examinations, most                 recent 08/24/2021. Risk Factors:Current Smoker. Generalized                 weakness/shortness of breath-found to have AKI and severe                 normocytic anemia.  Sonographer:    Alvino Chapel RCS Referring Phys: [id number] [place]  1. Left ventricular ejection fraction, by estimation, is 45 to 50%. The left ventricle has mildly decreased function. The left  ventricle demonstrates global hypokinesis. There is moderate concentric left ventricular hypertrophy. Left ventricular diastolic parameters are consistent with Grade II diastolic dysfunction (pseudonormalization).  2. Right ventricular systolic function is normal. The right ventricular size is normal. Tricuspid regurgitation signal is inadequate for assessing PA pressure.  3. Left atrial size was moderately dilated.  4. A small to moderate pericardial effusion is present. The pericardial effusion is anterior to the right ventricle. There is no evidence of cardiac tamponade.  5. The mitral valve is grossly normal. Mild mitral valve regurgitation.  6. The aortic valve is tricuspid. There is mild thickening of the aortic valve. Aortic valve regurgitation is not visualized.  7. The inferior vena cava is normal in size with greater than 50% respiratory variability, suggesting right atrial pressure of 3 mmHg.  8. No obvious valvular vegetations. Comparison(s): Prior images reviewed side by side. LVEF mildly reduced and pericardial effusion is new. FINDINGS  Left Ventricle: Left ventricular ejection fraction, by estimation, is 45 to 50%. The left ventricle has mildly decreased function. The left ventricle demonstrates global hypokinesis. The left ventricular internal cavity size was normal in size. There is  moderate concentric left ventricular hypertrophy. Left ventricular diastolic parameters are consistent with Grade II diastolic dysfunction (pseudonormalization). Right Ventricle: The right ventricular size is normal. No increase in right ventricular wall thickness. Right ventricular systolic function is normal. Tricuspid regurgitation signal is inadequate for assessing PA pressure. Left Atrium: Left atrial size was moderately dilated. Right Atrium: Right atrial size was normal in size. Pericardium: A small pericardial effusion is present. The pericardial effusion is anterior to the right ventricle. There is no evidence  of cardiac tamponade. Mitral Valve: The mitral valve is grossly normal. There is mild thickening of the mitral valve leaflet(s). Mild mitral annular calcification. Mild mitral valve regurgitation. Tricuspid Valve: The tricuspid valve is grossly normal. Tricuspid valve regurgitation is trivial. Aortic Valve: The aortic valve is tricuspid. There is mild thickening of the aortic valve. There is mild to moderate aortic valve annular calcification. Aortic valve regurgitation is not visualized. Pulmonic Valve: The pulmonic valve was grossly normal. Pulmonic valve regurgitation is trivial. Aorta: The aortic root is normal in size and structure. Venous: The inferior vena cava is normal in size with greater than 50% respiratory variability, suggesting right atrial pressure of 3 mmHg. IAS/Shunts: No atrial  level shunt detected by color flow Doppler.  LEFT VENTRICLE PLAX 2D LVIDd:         5.10 cm  Diastology LVIDs:         3.90 cm  LV e' medial:    3.77 cm/s LV PW:         1.50 cm  LV E/e' medial:  22.9 LV IVS:        1.40 cm  LV e' lateral:   7.18 cm/s LVOT diam:     2.00 cm  LV E/e' lateral: 12.0 LV SV:         51 LV SV Index:   27 LVOT Area:     3.14 cm  RIGHT VENTRICLE RV S prime:     14.40 cm/s TAPSE (M-mode): 1.9 cm LEFT ATRIUM              Index       RIGHT ATRIUM           Index LA diam:        4.20 cm  2.23 cm/m  RA Area:     14.50 cm LA Vol (A2C):   104.0 ml 55.29 ml/m RA Volume:   36.00 ml  19.14 ml/m LA Vol (A4C):   64.4 ml  34.24 ml/m LA Biplane Vol: 83.7 ml  44.50 ml/m  AORTIC VALVE LVOT Vmax:   89.00 cm/s LVOT Vmean:  53.600 cm/s LVOT VTI:    0.161 m  AORTA Ao Root diam: 3.50 cm MITRAL VALVE MV Area (PHT): 3.65 cm    SHUNTS MV Decel Time: 208 msec    Systemic VTI:  0.16 m MV E velocity: 86.40 cm/s  Systemic Diam: 2.00 cm MV A velocity: 63.70 cm/s MV E/A ratio:  1.36 Rozann Lesches MD Electronically signed by Rozann Lesches MD Signature Date/Time: 09/08/2021/5:26:36 PM    Final    ECHOCARDIOGRAM  COMPLETE  Result Date: 08/24/2021    ECHOCARDIOGRAM REPORT   Patient Name:   SEAB AXEL Date of Exam: 08/24/2021 Medical Rec #:  [id number]    Height:       70.0 in Accession #:    [id number]   Weight:       180.0 lb Date of Birth:  1951/03/24     BSA:          1.996 m Patient Age:    89 years     BP:           120/69 mmHg Patient Gender: M            HR:           85 bpm. Exam Location:  Inpatient Procedure: 2D Echo, Cardiac Doppler and Color Doppler Indications:    Abnormal EKG  History:        Patient has no prior history of Echocardiogram examinations.                 COPD, Signs/Symptoms:Dyspnea and Weakness, renal disease,                 anemia, elevated troponin; Risk Factors:Current Smoker.  Sonographer:    Dustin Flock RDCS Referring Phys: [place]  Sonographer Comments: Image acquisition challenging due to COPD. IMPRESSIONS  1. Left ventricular ejection fraction, by estimation, is 55 to 60%. The left ventricle has normal function. The left ventricle has no regional wall motion abnormalities. There is mild concentric left ventricular hypertrophy. Left ventricular diastolic parameters are consistent with Grade I diastolic  dysfunction (impaired relaxation).  2. Right ventricular systolic function is normal. The right ventricular size is normal. Tricuspid regurgitation signal is inadequate for assessing PA pressure.  3. The mitral valve is grossly normal. No evidence of mitral valve regurgitation. No evidence of mitral stenosis.  4. The aortic valve was not well visualized. Aortic valve regurgitation is not visualized. No aortic stenosis is present.  5. The inferior vena cava is normal in size with greater than 50% respiratory variability, suggesting right atrial pressure of 3 mmHg. Comparison(s): No prior Echocardiogram. FINDINGS  Left Ventricle: Left ventricular ejection fraction, by estimation, is 55 to 60%. The left ventricle has normal function. The left ventricle has no regional wall  motion abnormalities. The left ventricular internal cavity size was normal in size. There is  mild concentric left ventricular hypertrophy. Left ventricular diastolic parameters are consistent with Grade I diastolic dysfunction (impaired relaxation). Right Ventricle: The right ventricular size is normal. No increase in right ventricular wall thickness. Right ventricular systolic function is normal. Tricuspid regurgitation signal is inadequate for assessing PA pressure. Left Atrium: Left atrial size was normal in size. Right Atrium: Right atrial size was normal in size. Pericardium: There is no evidence of pericardial effusion. Mitral Valve: The mitral valve is grossly normal. No evidence of mitral valve regurgitation. No evidence of mitral valve stenosis. Tricuspid Valve: The tricuspid valve is normal in structure. Tricuspid valve regurgitation is not demonstrated. No evidence of tricuspid stenosis. Aortic Valve: The aortic valve was not well visualized. Aortic valve regurgitation is not visualized. No aortic stenosis is present. Pulmonic Valve: The pulmonic valve was not well visualized. Pulmonic valve regurgitation is not visualized. Aorta: The aortic root is normal in size and structure. Venous: The inferior vena cava is normal in size with greater than 50% respiratory variability, suggesting right atrial pressure of 3 mmHg. IAS/Shunts: The atrial septum is grossly normal.  LEFT VENTRICLE PLAX 2D LVIDd:         5.80 cm      Diastology LVIDs:         3.60 cm      LV e' medial:    5.55 cm/s LV PW:         1.30 cm      LV E/e' medial:  14.5 LV IVS:        1.30 cm      LV e' lateral:   6.74 cm/s LVOT diam:     2.40 cm      LV E/e' lateral: 12.0 LV SV:         76 LV SV Index:   38 LVOT Area:     4.52 cm  LV Volumes (MOD) LV vol d, MOD A4C: 124.0 ml LV vol s, MOD A4C: 47.8 ml LV SV MOD A4C:     124.0 ml RIGHT VENTRICLE RV Basal diam:  2.80 cm RV S prime:     10.10 cm/s TAPSE (M-mode): 2.9 cm LEFT ATRIUM              Index       RIGHT ATRIUM           Index LA diam:        3.80 cm 1.90 cm/m  RA Area:     13.10 cm LA Vol (A2C):   31.8 ml 15.93 ml/m RA Volume:   29.70 ml  14.88 ml/m LA Vol (A4C):   33.0 ml 16.53 ml/m LA Biplane Vol: 34.9 ml 17.49 ml/m  AORTIC VALVE LVOT  Vmax:   98.30 cm/s LVOT Vmean:  60.300 cm/s LVOT VTI:    0.169 m  AORTA Ao Root diam: 3.30 cm MITRAL VALVE MV Area (PHT): 3.85 cm    SHUNTS MV Decel Time: 197 msec    Systemic VTI:  0.17 m MV E velocity: 80.60 cm/s  Systemic Diam: 2.40 cm MV A velocity: 49.30 cm/s MV E/A ratio:  1.63 Rudean Haskell MD Electronically signed by Rudean Haskell MD Signature Date/Time: 08/24/2021/11:38:40 AM    Final    CT Renal Stone Study  Result Date: 08/23/2021 CLINICAL DATA:  Hematuria, new renal failure EXAM: CT ABDOMEN AND PELVIS WITHOUT CONTRAST TECHNIQUE: Multidetector CT imaging of the abdomen and pelvis was performed following the standard protocol without IV contrast. COMPARISON:  06/26/2007 FINDINGS: Lower chest: There is mild, scattered, nonspecific ground-glass and fine nodularity throughout the included bilateral lung bases (series 5, 4). Hepatobiliary: No solid liver abnormality is seen. No gallstones, gallbladder wall thickening, or biliary dilatation. Pancreas: Unremarkable. No pancreatic ductal dilatation or surrounding inflammatory changes. Spleen: Normal in size without significant abnormality. Adrenals/Urinary Tract: Adrenal glands are unremarkable. Kidneys are normal, without renal calculi, solid lesion, or hydronephrosis. Distended urinary bladder, measuring at least 17.4 cm. Stomach/Bowel: Stomach is within normal limits. Appendix not clearly visualized and may be surgically. No evidence of bowel wall thickening, distention, or inflammatory changes. Descending and sigmoid diverticulosis. Vascular/Lymphatic: Aortic atherosclerosis. No enlarged abdominal or pelvic lymph nodes. Reproductive: No mass or other significant abnormality. Other:  Small, fat containing bilateral inguinal hernias no abdominopelvic ascites. Musculoskeletal: No acute or significant osseous findings. IMPRESSION: 1. No evidence of urinary tract calculus or hydronephrosis. 2. Distended urinary bladder, measuring at least 17.4 cm. Correlate for urinary retention. 3. Descending and sigmoid diverticulosis without evidence of acute diverticulitis. 4. Mild, nonspecific scattered ground-glass and fine nodularity throughout the bilateral lung bases, possibly infectious or inflammatory. Aortic Atherosclerosis (ICD10-I70.0). Electronically Signed   By: Eddie Candle M.D.   On: 08/23/2021 16:04   US BIOPSY (KIDNEY)  Result Date: 08/28/2021 INDICATION: Acute kidney injury of uncertain etiology. Please perform image guided biopsy for tissue diagnostic purposes. EXAM: ULTRASOUND GUIDED RENAL BIOPSY COMPARISON:  CT abdomen and pelvis-08/23/2021 MEDICATIONS: None. ANESTHESIA/SEDATION: Fentanyl 100 mcg IV; Versed 2 mg IV Total Moderate Sedation time: 14 minutes; The patient was continuously monitored during the procedure by the interventional radiology nurse under my direct supervision. COMPLICATIONS: None immediate. PROCEDURE: Informed written consent was obtained from the patient after a discussion of the risks, benefits and alternatives to treatment. The patient understands and consents the procedure. A timeout was performed prior to the initiation of the procedure. Ultrasound scanning was performed of the bilateral flanks. The inferior pole of the right kidney was selected for biopsy due to location and sonographic window. The procedure was planned. The operative site was prepped and draped in the usual sterile fashion. The overlying soft tissues were anesthetized with 1% lidocaine with epinephrine. A 17 gauge core needle biopsy device was advanced into the inferior cortex of the right kidney and 3 core biopsies were obtained under direct ultrasound guidance. Images were saved for  documentation purposes. The biopsy device was removed and hemostasis was obtained with manual compression. Post procedural scanning was negative for significant post procedural hemorrhage or additional complication. A dressing was placed. The patient tolerated the procedure well without immediate post procedural complication. IMPRESSION: Technically successful ultrasound guided right renal biopsy. Electronically Signed   By: Sandi Mariscal M.D.   On: 08/28/2021 12:52   IR  EMBO ART  VEN HEMORR LYMPH EXTRAV  INC GUIDE ROADMAPPING  Result Date: 08/31/2021 INDICATION: 70 year old male with history of acute kidney injury of uncertain etiology status post ultrasound-guided right renal biopsy on 08/28/2021. Since biopsy, the patient has experienced gross hematuria with associated acute anemia. EXAM: 1. Ultrasound-guided vascular access of the right internal jugular vein. 2. Temporary hemodialysis catheter placement. 3. Ultrasound-guided vascular access of the right common femoral artery. 4. Selective catheterization and angiography of the right renal artery. 5. Sub selective catheterization angiography of right inferior polar and arcuate artery branches. 6. Coil embolization of right inferior pole arcuate artery branch. MEDICATIONS: None. ANESTHESIA/SEDATION: Moderate (conscious) sedation was employed during this procedure. A total of Versed 3 mg and Fentanyl 50 mcg was administered intravenously. Moderate Sedation Time: 66 minutes. The patient's level of consciousness and vital signs were monitored continuously by radiology nursing throughout the procedure under my direct supervision. CONTRAST:  69m OMNIPAQUE IOHEXOL 350 MG/ML SOLN, 53mOMNIPAQUE IOHEXOL 350 MG/ML SOLN FLUOROSCOPY TIME:  Fluoroscopy Time: 10.6 minutes, (811 mGy). COMPLICATIONS: None immediate. PROCEDURE: Informed consent was obtained from the patient following explanation of the procedure, risks, benefits and alternatives. The patient understands,  agrees and consents for the procedure. All questions were addressed. A time out was performed prior to the initiation of the procedure. Maximal barrier sterile technique utilized including caps, mask, sterile gowns, sterile gloves, large sterile drape, hand hygiene, and chlorhexidine prep. Preprocedure ultrasound evaluation of the right internal jugular vein demonstrated a patent and compressible vein free of internal echoes. Procedure was planned. Subdermal Local anesthesia was provided 1% lidocaine. A small skin nick was made. Under direct ultrasound visualization, a 21 gauge micropuncture needle was directed into the internal jugular vein. An image was captured and stored in the permanent record. A micropuncture set was inserted and exchanged for a J wire which was positioned in the inferior vena cava. Serial dilation was performed followed by placement of a 12.5 French, 24 cm Trialysis catheter. The catheter tip was positioned in the right atrium. Each lumen flushed and aspirated appropriately. The dialysis ports were locked with appropriate volume of heparin dwell. The middle central venous port was then used for sedation for the remainder of the procedure. The catheter was secured with a 0 silk retention suture. A sterile bandage was applied. Preprocedure ultrasound evaluation of the right groin was performed which demonstrated a patent right common femoral artery. The procedure was planned. Subdermal Local anesthesia was provided with 1% lidocaine. A small skin nick was made. Under direct ultrasound visualization, a 21 gauge micropuncture needle was directed into the common femoral artery. An ultrasound image was captured and stored in the permanent record. A micro puncture set was inserted and a limited right lower extremity angiogram was performed which demonstrated appropriate puncture site for closure device use. A J wire was directed to the abdominal aorta and the micropuncture set was exchanged for a 5  [place] sheath. A 5 French C2 catheter was then directed into the right renal ostium. Right renal angiogram was performed. The single main renal artery was patent. 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 A straight lantern microcatheter and 0.014" soft synchro wire was then inserted and directed into the inferior polar branch. Repeat angiogram was performed in the sub selective location which was significant for  multifocal 2-4 mm pseudoaneurysm formation, abnormal truncation and irregularity of the arcuate and interlobular branches, an early arteriovenous shunting. The inferior polar arcuate branch was selected further. Coil embolization was performed with multiple low profile Penumbra Ruby coils ranging from 2-3 mm in diameter. Completion right inferior renal angiogram was performed which demonstrated appropriate embolization of the targeted vessels without persistent arteriovenous fistula or vessel irregularity. The catheters were removed. The right common femoral artery was then closed with a 6 [place] Angio-Seal device. Distal pulses were unchanged. The patient tolerated the procedure well was transferred back to the floor in good condition. IMPRESSION: 1. 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 2. Sub selective coil embolization of right inferior polar arcuate artery branch. 3. Successful placement of right internal jugular, 24 French Trialysis catheter with the catheter tip in the right atrium. The catheter is ready for immediate use. Ruthann Cancer, MD [facility] Electronically Signed   By:  Ruthann Cancer M.D.   On: 08/31/2021 09:29    Surgical Pathology  CASE: [id number]  PATIENT: Warner Bennie  Bone Marrow Report   Clinical History: Amyloid , right Iliac (BH)   DIAGNOSIS:   BONE MARROW, ASPIRATE, CLOT, CORE:  - Plasma cell myeloma, see comment.   PERIPHERAL BLOOD:  - Normocytic anemia.   COMMENT:   The marrow is normocellular but exhibits increased monoclonal plasma  cells (17% aspirate, 15-20% CD138 immunohistochemistry). The findings  are consistent with plasma cell myeloma. [place] red is pending and will  be reported in an addendum. There is some atypia in the megakaryocytes  and FISH for MDS was added for completeness.   MICROSCOPIC DESCRIPTION:   PERIPHERAL BLOOD SMEAR: There is a normocytic anemia with occasional  hypochromic cells.  There is no rouleaux formation.  Leukocytes are  present in normal numbers.  Circulating plasma cells are not identified.  Platelets are present in normal numbers.   BONE MARROW ASPIRATE: Spicular and cellular.  Erythroid precursors: Relative decrease in numbers.  No significant  dysplasia.  Granulocytic precursors: Relative increase in numbers.  No significant  dysplasia.  No increase in blasts.  Megakaryocytes: Mild increase in numbers.  Occasional forms with  hypolobated or abnormal nuclei.  Lymphocytes/plasma cells: Plasma cells are increased in numbers (6% by  manual differential counts) with atypical forms (multinucleation, large  forms).  Lymphocytes are not increased.   TOUCH PREPARATIONS: Similar to aspirate smears.   CLOT AND BIOPSY: The core biopsy and clot section are normocellular for  age (30%).  There is a mild myeloid hyperplasia.  Megakaryocytes are  increased in numbers with scattered atypical forms. CD138  immunohistochemistry reveals increased plasma cells (15-20%) which are  scattered and with small clusters. By light chain in situ hybridization  the plasma cells are lambda restricted.   IRON  STAIN: Iron stains are performed on a bone marrow aspirate or touch  imprint smear and section of clot. The controls stained appropriately.        Storage Iron: Present       Ring Sideroblasts: Absent   ADDITIONAL DATA/TESTING: Cytogenetics, including FISH for myeloma and  MDS, was ordered and will be reported in an addendum.   CELL COUNT DATA:   Bone Marrow count performed on 500 cells shows:  Blasts:   0%   Myeloid:  66%  Promyelocytes: 0%   Erythroid:  11%  Myelocytes:    8%   Lymphocytes:   6%  Metamyelocytes:     1%   Plasma cells:  17%  Bands:    8%  Neutrophils:   41%  M:E ratio:     6.0  Eosinophils:   8%  Basophils:     0%  Monocytes:     0%   Lab Data: CBC performed on 09/05/21 shows:  WBC: 8.5 k/uL  Neutrophils:   57%  Hgb: 9.6 g/dL  Lymphocytes:   27%  HCT: 30.3 %    Monocytes:     8%  MCV: 84.6 fL   Eosinophils:   6%  RDW: 17.4 %    Basophils:     2%  PLT: 380 k/uL    ASSESSMENT AND PLAN: 1.  AL amyloidosis/plasma cell myeloma 2.  Anemia secondary to renal insufficiency, iron deficiency, and folate deficiency 3.  Acute kidney injury secondary to #1 4.  History of nephrolithiasis 5.  Streptococcus agalactiae bacteremia diagnosed 09/07/2021   -Pt is clinically stable, worsening renal function and he restarted HD today  -depends on his discharge plan, will give third dose chemo in office or in hospital, and I would like to add Dara injection if that's approved by his insurance (pending now) -overall prognosis is poor, I have not see significant clinical benefit of chemo yet after two doses chemo, but it could be too early to tell, will repeat his M-protein and light chain level after 3-4 weeks of treatment.  -use zofran as needed for nausea which works for him  -we will f/u    LOS: 27 days    Truitt Merle  09/19/2021

## 2021-09-19 NOTE — Care Management Important Message (Signed)
Important Message  Patient Details  Name: JERALD HENNINGTON MRN: [id number] Date of Birth: 03/16/1951   Medicare Important Message Given:  Yes     Shelda Altes 09/19/2021, 10:45 AM

## 2021-09-20 ENCOUNTER — Inpatient Hospital Stay (HOSPITAL_COMMUNITY): Payer: Commercial Managed Care - PPO

## 2021-09-20 ENCOUNTER — Other Ambulatory Visit (HOSPITAL_COMMUNITY): Payer: Self-pay

## 2021-09-20 DIAGNOSIS — E854 Organ-limited amyloidosis: Secondary | ICD-10-CM | POA: Diagnosis not present

## 2021-09-20 DIAGNOSIS — N185 Chronic kidney disease, stage 5: Secondary | ICD-10-CM

## 2021-09-20 DIAGNOSIS — N08 Glomerular disorders in diseases classified elsewhere: Secondary | ICD-10-CM | POA: Diagnosis not present

## 2021-09-20 DIAGNOSIS — N179 Acute kidney failure, unspecified: Secondary | ICD-10-CM | POA: Diagnosis not present

## 2021-09-20 LAB — CBC WITH DIFFERENTIAL/PLATELET
Abs Immature Granulocytes: 0.03 10*3/uL (ref 0.00–0.07)
Basophils Absolute: 0.1 10*3/uL (ref 0.0–0.1)
Basophils Relative: 1 %
Eosinophils Absolute: 0.1 10*3/uL (ref 0.0–0.5)
Eosinophils Relative: 1 %
HCT: 26.9 % — ABNORMAL LOW (ref 39.0–52.0)
Hemoglobin: 8.3 g/dL — ABNORMAL LOW (ref 13.0–17.0)
Immature Granulocytes: 0 %
Lymphocytes Relative: 24 %
Lymphs Abs: 2.2 10*3/uL (ref 0.7–4.0)
MCH: 26.6 pg (ref 26.0–34.0)
MCHC: 30.9 g/dL (ref 30.0–36.0)
MCV: 86.2 fL (ref 80.0–100.0)
Monocytes Absolute: 0.8 10*3/uL (ref 0.1–1.0)
Monocytes Relative: 9 %
Neutro Abs: 5.9 10*3/uL (ref 1.7–7.7)
Neutrophils Relative %: 65 %
Platelets: 254 10*3/uL (ref 150–400)
RBC: 3.12 MIL/uL — ABNORMAL LOW (ref 4.22–5.81)
RDW: 18.4 % — ABNORMAL HIGH (ref 11.5–15.5)
WBC: 9.2 10*3/uL (ref 4.0–10.5)
nRBC: 0 % (ref 0.0–0.2)

## 2021-09-20 LAB — RENAL FUNCTION PANEL
Albumin: <1.5 g/dL — ABNORMAL LOW (ref 3.5–5.0)
Anion gap: 9 (ref 5–15)
BUN: 21 mg/dL (ref 8–23)
CO2: 25 mmol/L (ref 22–32)
Calcium: 8.2 mg/dL — ABNORMAL LOW (ref 8.9–10.3)
Chloride: 102 mmol/L (ref 98–111)
Creatinine, Ser: 4.09 mg/dL — ABNORMAL HIGH (ref 0.61–1.24)
GFR, Estimated: 15 mL/min — ABNORMAL LOW (ref >60)
Glucose, Bld: 94 mg/dL (ref 70–99)
Phosphorus: 4.8 mg/dL — ABNORMAL HIGH (ref 2.5–4.6)
Potassium: 4 mmol/L (ref 3.5–5.1)
Sodium: 136 mmol/L (ref 135–145)

## 2021-09-20 LAB — HEPATITIS B SURFACE ANTIBODY, QUANTITATIVE: Hep B S AB Quant (Post): <3.1 m[IU]/mL — ABNORMAL LOW (ref >9.9)

## 2021-09-20 NOTE — Progress Notes (Addendum)
Contacted [facility] admissions this am. Awaiting response with pt's clinic and chair time.   Melven Sartorius Renal Navigator [phone]   Addendum at 2:59: Pt's out-pt schedule received.  [place] MWF Arrive at 12:20 for 12:40 chair time.  Pt will need to arrive at 11:45 for first treatment to complete paperwork.  Above information discussed and provided to pt's son via phone. Will assist as needed. Update provided to nephrologist as well.

## 2021-09-20 NOTE — Progress Notes (Signed)
[facility] PROGRESS NOTE    Progress Note  Caleb Simmons  QBH:[id number] DOB: February 07, 1951 DOA: 08/23/2021 PCP: Pcp, No    Brief Narrative:   Caleb Simmons is an 70 y.o. male with no significant medical history, but he does not see a primary care doctor regularly presented to the ED with weakness, noted to have acute kidney injury and severe normocytic anemia . 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  Oncology was consulted, started on chemotherapy 9/20.  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 -Despite good urine output his creatinine continued to worsen -Diagnosed on renal biopsy, oncology consulted and following, started chemo CyBorD on 9/20, second dose on 9/26 -Per oncology if discharged will follow-up Monday 10/3 for next chemo -Nephrology following, previously had HD and then was on intermittent dialysis as needed, restarted HD 9/28 -Overall prognosis felt to be poor considering cardiac involvement as well, seen by palliative care, patient wishes to pursue full aggressive medical treatment at this time -Potassium stable, improved on  daily Lokelma -Counts are stable, continue to monitor -On acyclovir for prophylaxis - CLIP pending  Sepsis  Group B strep bacteremia: -Repeat blood cultures are negative, TTE without vegetation, bacteremia cleared quickly, followed by infectious disease, recommended 10 days of IV penicillin, this was completed on 9/25  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 He required multiple transfusions. Status post embolization on 08/30/2021. -reolved  Anxiety: Continue Xanax, continue trazodone. He is not interested in other medications like SSRI.  Tobacco abuse: Continue nicotine patch.   DVT prophylaxis: Hep SQ Full code Family Communication:no family at bedside, discussed patient in detail Status is: Inpatient  Remains inpatient appropriate because:Hemodynamically unstable  Dispo: The patient is from: Home              Anticipated d/c is to: Home              Patient currently is not medically stable to d/c.   Difficult to place patient No   IV Access:   Peripheral IV   Procedures and diagnostic studies:   VAS [place] UPPER EXT VEIN MAPPING (PRE-OP AVF)  Result Date: 09/20/2021 UPPER EXTREMITY VEIN MAPPING Patient Name:  Caleb Simmons  Date of  Exam:   09/20/2021 Medical Rec #: [id number]     Accession #:    [id number] Date of Birth: Jan 04, 1951      Patient Gender: M Patient Age:   53 years Exam Location:   [facility] Procedure:      VAS [place] UPPER EXT VEIN MAPPING (PRE-OP AVF) Referring Phys: Otelia Santee --------------------------------------------------------------------------------  Indications: Pre-access. Comparison Study: No prior studies. Performing Technologist: Darlin Coco RDMS, RVT  Examination Guidelines: A complete evaluation includes B-mode imaging, spectral Doppler, color Doppler, and power Doppler as needed of all accessible portions of each vessel. Bilateral testing is considered an integral part of a complete examination. Limited examinations for reoccurring indications may be performed as noted. +-----------------+-------------+----------+---------+ Right Cephalic   Diameter (cm)Depth (cm)Findings  +-----------------+-------------+----------+---------+ Shoulder             0.33        0.97             +-----------------+-------------+----------+---------+ Prox upper arm       0.36        0.75             +-----------------+-------------+----------+---------+ Mid upper arm        0.27        0.45   branching +-----------------+-------------+----------+---------+ Dist upper arm       0.23        0.20   branching +-----------------+-------------+----------+---------+ Antecubital fossa    0.23        0.17             +-----------------+-------------+----------+---------+ Prox forearm         0.21        0.24   branching +-----------------+-------------+----------+---------+ Mid forearm          0.25        0.27             +-----------------+-------------+----------+---------+ Dist forearm         0.19        0.32             +-----------------+-------------+----------+---------+ Wrist                0.20        0.29             +-----------------+-------------+----------+---------+ +-----------------+-------------+----------+---------+ Left Cephalic    Diameter (cm)Depth (cm)Findings  +-----------------+-------------+----------+---------+  Shoulder             0.25        1.23             +-----------------+-------------+----------+---------+ Prox upper arm       0.31        0.88             +-----------------+-------------+----------+---------+ Mid upper arm        0.37        0.52             +-----------------+-------------+----------+---------+ Dist upper arm     0.30/0.32  0.49/0.78 branching +-----------------+-------------+----------+---------+ Antecubital fossa    0.25        0.24   branching +-----------------+-------------+----------+---------+ Prox forearm         0.29        0.23             +-----------------+-------------+----------+---------+ Mid forearm        0.26/0.41  0.34/0.26 branching +-----------------+-------------+----------+---------+ Dist forearm         0.30  0.12             +-----------------+-------------+----------+---------+ Wrist                0.33        0.10             +-----------------+-------------+----------+---------+ *See table(s) above for measurements and observations.  Diagnosing physician:    Preliminary      Medical Consultants:   Nephrology, oncology   Subjective:    Caleb Simmons feels okay overall, tolerated dialysis yesterday  Objective:    Vitals:   09/19/21 1545 09/19/21 2027 09/20/21 0500 09/20/21 0830  BP: 98/63 100/63 104/61 108/66  Pulse: 85 90 79 68  Resp: _0 Temp: 98.1 F (36.7 C) 98.1 F (36.7 C) 98 F (36.7 C) 98.5 F (36.9 C)  TempSrc: Oral Oral Oral Oral  SpO2: 95%  94% 99%  Weight:   71 kg   Height:       SpO2: 99 % O2 Flow Rate (L/min): 2 L/min   Intake/Output Summary (Last 24 hours) at 09/20/2021 1311 Last data filed at 09/20/2021 1100 Gross per 24 hour  Intake 360 ml  Output 1550 ml  Net -1190 ml   Filed Weights   09/19/21 0833 09/19/21 1107 09/20/21 0500  Weight: 71 kg 71.3 kg 71 kg    Exam General exam: Chronically ill male sitting up in bed, AAOx3, nondistressed HEENT: Right  IJ HD catheter CVS: S1-S2, regular rate rhythm Lungs: Clear bilaterally Abdomen: Soft, nontender, bowel sounds present Extremities: No edema Skin: No rash on exposed skin  Data Reviewed:    Labs: Basic Metabolic Panel: Recent Labs  Lab 09/15/21 0144 09/16/21 0139 09/16/21 0737 09/17/21 0514 09/18/21 0410 09/20/21 0225  NA 132* 135 133* 134* 133* 136  K 4.8 5.8* 5.3* 5.1 4.9 4.0  CL 100 101 101 100 98 102  CO2 22 21* _1 GLUCOSE 95 91 83 91 108* 94  BUN 39* 42* 39* 41* 45* 21  CREATININE 5.37* 5.48* 5.69* 5.91* 6.05* 4.09*  CALCIUM 8.1* 8.2* 8.6* 8.3* 8.6* 8.2*  PHOS 5.3* 5.9*  --  6.5* 6.3* 4.8*   GFR Estimated Creatinine Clearance: 16.6 mL/min (A) (by C-G formula based on SCr of 4.09 mg/dL (H)). Liver Function Tests: Recent Labs  Lab 09/15/21 0144 09/16/21 0139 09/17/21 0514 09/18/21 0410 09/20/21 0225  ALBUMIN 1.6* 1.6* 1.5* 1.6* <1.5*   No results for input(s): LIPASE, AMYLASE in the last 168 hours. No results for input(s): AMMONIA in the last 168 hours. Coagulation profile No results for input(s): INR, PROTIME in the last 168 hours. COVID-19 Labs  No results for input(s): DDIMER, FERRITIN, LDH, CRP in the last 72 hours.  Lab Results  Component Value Date   [place] NEGATIVE 08/23/2021    CBC: Recent Labs  Lab 09/15/21 0144 09/16/21 0139 09/17/21 0514 09/18/21 0410 09/20/21 0225  WBC 11.5* 11.0* 10.0 12.0* 9.2  NEUTROABS 7.2 7.2 6.6 9.7* 5.9  HGB 9.2* 9.3* 9.3* 9.3* 8.3*  HCT 28.4* 29.9* 29.4* 28.9* 26.9*  MCV 83.5 85.7 85.0 83.5 86.2  PLT 437* 458* 377 347 254   Cardiac Enzymes: No results for input(s): CKTOTAL, CKMB, CKMBINDEX, TROPONINI in the last 168 hours. BNP (last 3 results) No results for input(s): PROBNP in the last 8760 hours. CBG: No results for input(s): GLUCAP in the last 168 hours. D-Dimer: No results for input(s): DDIMER in the last 72 hours. Hgb A1c: No results  for input(s): HGBA1C in the last 72  hours. Lipid Profile: No results for input(s): CHOL, HDL, LDLCALC, TRIG, CHOLHDL, LDLDIRECT in the last 72 hours. Thyroid function studies: No results for input(s): TSH, T4TOTAL, T3FREE, THYROIDAB in the last 72 hours.  Invalid input(s): FREET3 Anemia work up: No results for input(s): VITAMINB12, FOLATE, FERRITIN, TIBC, IRON, RETICCTPCT in the last 72 hours. Sepsis Labs: Recent Labs  Lab 09/16/21 0139 09/17/21 0514 09/18/21 0410 09/20/21 0225  WBC 11.0* 10.0 12.0* 9.2   Microbiology No results found for this or any previous visit (from the past 240 hour(s)).    Medications:    acyclovir  200 mg Oral Q12H   Chlorhexidine Gluconate Cloth  6 each Topical Q0600   feeding supplement (NEPRO CARB STEADY)  237 mL Oral TID BM   folic acid  2 mg Oral Daily   Gerhardt's butt cream   Topical BID   heparin injection (subcutaneous)  5,000 Units Subcutaneous Q8H   melatonin  5 mg Oral QHS   nicotine  14 mg Transdermal Q24H   pantoprazole  40 mg Oral BID AC   tamsulosin  0.4 mg Oral Daily   Continuous Infusions:  sodium chloride 10 mL/hr at 09/17/21 0000   ceFAZolin 2 g (09/18/21 1205)   sodium chloride irrigation        LOS: 28 days   [facility]  09/20/2021, 1:11 PM

## 2021-09-20 NOTE — Progress Notes (Signed)
[facility] Progress Note   70 year old nephrolithiasis generalized weakness presented with AKI s/p  renal biopsy 05/27/7845 complicated by postbiopsy bleeding into the renal collecting system with clot retention -> CBI.  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  She is undergoing dialysis 09/07/2021.  We are dialyzing on a as needed basis.  We are coordinating treatment with his chemotherapy.  Last dialysis was 09/07/2021 with 2.4 L removed.  He is making good urine with 1.9 L    09/15/2021    we discussed dialysis and/or palliative medicine and he wishes to proceed with HD if necessary.  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 MRI suggestive of cardiac involvement   Assessment/ Plan:   #Acute kidney injury, nonoliguric, with history of excessive NSAID's use/BC powders every day.  Peaked creatinine level of 6.95. [name]  He underwent IR guided kidney biopsy on 9/6 complicated by postbiopsy bleeding.  Renal biopsy: AL amyloidosis, lambda light chain composition (glomeruli, interstitium, arteries, and arterioles). Marked IF, 59% global glomerular sclerosis and marked arteriosclerosis.     He received a one time treatment of dialysis 9/16. However renal function continues to remain poor. Prognosis is poor, Patient is considering either palliative medicine or dialysis -> very stressed understandably but wants RRT if needed. UOP is  good.   - Has baseline nausea even between chemo treatments with appetite that is not great. D/W pt and we will start RRT as ESRD as this is very possibly uremia. Will also CLIP. -> paperwork has been submitted and we are awaiting response. Renal navigator aware.   - 1st HD treatment for 09/19/21 AM (net UF neg 293 ml)   - Plan next HD for Fri; he states for 1st time in a while he has an appetite this AM and feels that a "fog" is disappearing with increased lucidity.  He's not  ready for an access placement bec of having many things going on at the same time including having to initiate dialysis and does not want an access at this time. Given prognosis I think this is reasonable.   Scheduled  f/u with [facility] with Dr. Senaida Lange Oct 12th at 230pm and will cancel when plans are definitive.  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  Per primary service     #Pulm edema appears to have resolved with good diuresis     #Anemia, appears to be relatively stable with a hemoglobin is unchanged - will check iron panels w/ dialysis tomorrow and load if needed + ESA if needed.   #History of nephrolithiasis: Followed by urology   #Metabolic acidosis: Appears to have resolved -> stopped the oral sodium bicarbonate now that he is on HD.   #Mild hyperkalemia we will continue Lokelma as needed.   Subjective:   Good  UOP, no hypotensive episodes but BP is on the lower side.  Nausea worse after chemo but always has a baseline nausea. Appetite better for 1st time this AM and "fog" clearing.    Objective:   BP 108/66   Pulse 68   Temp 98.5 F (36.9 C) (Oral)   Resp 14   Ht 5' 10"  (1.778 m)   Wt 71 kg   SpO2 94%   BMI 22.46 kg/m   Intake/Output Summary (Last 24  hours) at 09/20/2021 1009 Last data filed at 09/20/2021 0500 Gross per 24 hour  Intake 360 ml  Output 907 ml  Net -547 ml   Weight change:   Physical Exam: General: nad Heart: rrr Lungs: normal wob, cta bl Abdomen: Soft, nontender Extremities: No LE edema. Neurology: Alert, awake, following commands, no asterixis Vascular Access: Right IJ TC  Imaging: IR Fluoro Guide CV Line Right  Result Date: 09/18/2021 INDICATION: 70 year old male referred for tunneled hemodialysis catheter EXAM: TUNNELED CENTRAL VENOUS HEMODIALYSIS CATHETER PLACEMENT WITH ULTRASOUND AND FLUOROSCOPIC GUIDANCE  MEDICATIONS: 2 g Ancef. The antibiotic was given in an appropriate time interval prior to skin puncture. ANESTHESIA/SEDATION: Moderate (conscious) sedation was employed during this procedure. A total of Versed 4.0 mg and Fentanyl 200 mcg was administered intravenously. Moderate Sedation Time: 20 minutes. The patient's level of consciousness and vital signs were monitored continuously by radiology nursing throughout the procedure under my direct supervision. FLUOROSCOPY TIME:  Fluoroscopy Time: 0 minutes 30 seconds (2 mGy). COMPLICATIONS: None PROCEDURE: Informed written consent was obtained from the patient after a discussion of the risks, benefits, and alternatives to treatment. Questions regarding the procedure were encouraged and answered. The right neck and chest were prepped with chlorhexidine in a sterile fashion, and a sterile drape was applied covering the operative field. Maximum barrier sterile technique with sterile gowns and gloves were used for the procedure. A timeout was performed prior to the initiation of the procedure. Ultrasound survey was performed. Micropuncture kit was utilized to access the right internal jugular vein under direct, real-time ultrasound guidance after the overlying soft tissues were anesthetized with 1% lidocaine with epinephrine. Stab incision was made with 11 blade scalpel. Microwire would not pass centrally. Once we confirmed that the tip of the needle was within the partially occluded IJ, a Nitrex wire was passed into the right heart. The needle was removed and the micro puncture was set was advanced onto the Nitrex wire. Nitrex wire was then exchanged for the standard microwire. The microwire was then marked to measure appropriate internal catheter length. External tunneled length was estimated. A total tip to cuff length of 19 cm was selected. 035 guidewire was advanced to the level of the IVC. Skin and subcutaneous tissues of chest wall below the clavicle were generously  infiltrated with 1% lidocaine for local anesthesia. A small stab incision was made with 11 blade scalpel. The selected hemodialysis catheter was tunneled in a retrograde fashion from the anterior chest wall to the venotomy incision. Serial dilation was performed and then a peel-away sheath was placed. The catheter was then placed through the peel-away sheath with tips ultimately positioned within the superior aspect of the right atrium. Final catheter positioning was confirmed and documented with a spot radiographic image. The catheter aspirates and flushes normally. The catheter was flushed with appropriate volume heparin dwells. The catheter exit site was secured with a 0-Prolene retention suture. Gel-Foam slurry was infused into the soft tissue tract. The venotomy incision was closed Derma bond and sterile dressing. Dressings were applied at the chest wall. Patient tolerated the procedure well and remained hemodynamically stable throughout. No complications were encountered and no significant blood loss encountered. IMPRESSION: Status post image guided placement of right IJ tunneled hemodialysis catheter. Signed, Dulcy Fanny. Dellia Nims, RPVI [facility] Radiology Electronically Signed   By: Corrie Mckusick D.O.   On: 09/18/2021 13:03   IR US Guide Vasc Access Right  Result Date: 09/18/2021 INDICATION: 70 year old male referred  for tunneled hemodialysis catheter EXAM: TUNNELED CENTRAL VENOUS HEMODIALYSIS CATHETER PLACEMENT WITH ULTRASOUND AND FLUOROSCOPIC GUIDANCE MEDICATIONS: 2 g Ancef. The antibiotic was given in an appropriate time interval prior to skin puncture. ANESTHESIA/SEDATION: Moderate (conscious) sedation was employed during this procedure. A total of Versed 4.0 mg and Fentanyl 200 mcg was administered intravenously. Moderate Sedation Time: 20 minutes. The patient's level of consciousness and vital signs were monitored continuously by radiology nursing  throughout the procedure under my direct supervision. FLUOROSCOPY TIME:  Fluoroscopy Time: 0 minutes 30 seconds (2 mGy). COMPLICATIONS: None PROCEDURE: Informed written consent was obtained from the patient after a discussion of the risks, benefits, and alternatives to treatment. Questions regarding the procedure were encouraged and answered. The right neck and chest were prepped with chlorhexidine in a sterile fashion, and a sterile drape was applied covering the operative field. Maximum barrier sterile technique with sterile gowns and gloves were used for the procedure. A timeout was performed prior to the initiation of the procedure. Ultrasound survey was performed. Micropuncture kit was utilized to access the right internal jugular vein under direct, real-time ultrasound guidance after the overlying soft tissues were anesthetized with 1% lidocaine with epinephrine. Stab incision was made with 11 blade scalpel. Microwire would not pass centrally. Once we confirmed that the tip of the needle was within the partially occluded IJ, a Nitrex wire was passed into the right heart. The needle was removed and the micro puncture was set was advanced onto the Nitrex wire. Nitrex wire was then exchanged for the standard microwire. The microwire was then marked to measure appropriate internal catheter length. External tunneled length was estimated. A total tip to cuff length of 19 cm was selected. 035 guidewire was advanced to the level of the IVC. Skin and subcutaneous tissues of chest wall below the clavicle were generously infiltrated with 1% lidocaine for local anesthesia. A small stab incision was made with 11 blade scalpel. The selected hemodialysis catheter was tunneled in a retrograde fashion from the anterior chest wall to the venotomy incision. Serial dilation was performed and then a peel-away sheath was placed. The catheter was then placed through the peel-away sheath with tips ultimately positioned within the  superior aspect of the right atrium. Final catheter positioning was confirmed and documented with a spot radiographic image. The catheter aspirates and flushes normally. The catheter was flushed with appropriate volume heparin dwells. The catheter exit site was secured with a 0-Prolene retention suture. Gel-Foam slurry was infused into the soft tissue tract. The venotomy incision was closed Derma bond and sterile dressing. Dressings were applied at the chest wall. Patient tolerated the procedure well and remained hemodynamically stable throughout. No complications were encountered and no significant blood loss encountered. IMPRESSION: Status post image guided placement of right IJ tunneled hemodialysis catheter. Signed, Dulcy Fanny. Dellia Nims, RPVI [facility] Electronically Signed   By: Corrie Mckusick D.O.   On: 09/18/2021 13:03    Labs: BMET Recent Labs  Lab 09/14/21 0430 09/15/21 0144 09/16/21 0139 09/16/21 0737 09/17/21 0514 09/18/21 0410 09/20/21 0225  NA 135 132* 135 133* 134* 133* 136  K 4.8 4.8 5.8* 5.3* 5.1 4.9 4.0  CL 102 100 101 101 100 98 102  CO2 24 22 21* 23 22 23 25   GLUCOSE 99 95 91 83 91 108* 94  BUN 38* 39* 42* 39* 41* 45* 21  CREATININE 5.31* 5.37* 5.48* 5.69* 5.91* 6.05* 4.09*  CALCIUM 8.3* 8.1* 8.2* 8.6* 8.3* 8.6*  8.2*  PHOS 5.0* 5.3* 5.9*  --  6.5* 6.3* 4.8*   CBC Recent Labs  Lab 09/16/21 0139 09/17/21 0514 09/18/21 0410 09/20/21 0225  WBC 11.0* 10.0 12.0* 9.2  NEUTROABS 7.2 6.6 9.7* 5.9  HGB 9.3* 9.3* 9.3* 8.3*  HCT 29.9* 29.4* 28.9* 26.9*  MCV 85.7 85.0 83.5 86.2  PLT 458* 377 347 254    Medications:     acyclovir  200 mg Oral Q12H   Chlorhexidine Gluconate Cloth  6 each Topical Q0600   feeding supplement (NEPRO CARB STEADY)  237 mL Oral TID BM   folic acid  2 mg Oral Daily   Gerhardt's butt cream   Topical BID   heparin injection (subcutaneous)  5,000 Units Subcutaneous Q8H   melatonin  5 mg  Oral QHS   nicotine  14 mg Transdermal Q24H   pantoprazole  40 mg Oral BID AC   tamsulosin  0.4 mg Oral Daily      Otelia Santee, MD 09/20/2021, 10:09 AM

## 2021-09-20 NOTE — Telephone Encounter (Signed)
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  Counseled on administration, dosing, side effects, monitoring, drug-food interactions, safe handling, storage, and disposal.  Patient will take cyclophosphamide 50mg  capsules, 9 capsules (450mg ) by mouth once weekly, to be given on the days of Velcade injection.  Patient will take cyclophosphamide without regard to food, however, administration with food may decrease GI upset. Instructed that cyclophosphamide needs to be taken early in the day and to maintain adequate hydration to prevent bladder toxicity.  Next dose of cyclophosphamide due: 09/24/21  Adverse effects include but are not limited to: decreased blood counts, nausea, vomiting, diarrhea, hair loss, and hemorrhagic cystitis.    Reviewed importance of keeping a medication schedule and plan for any missed doses. No barriers to medication adherence identified.  Medication reconciliation performed and medication/allergy list updated.  Insurance authorization for cyclophosphamide has been obtained. Test claim at the pharmacy revealed copayment $15 for 1st fill of cyclophosphamide. Patient's son and daughter in law will pick this up from the [facility] outpatient pharmacy on 09/20/21.  Informed family that the pharmacy will reach out 5-7 days prior to needing next fill of cyclophosphamide to coordinate continued medication acquisition to prevent break in therapy.  All questions answered.  Patient's son and daughter in law voiced understanding and appreciation.   Medication education handout will be given to patient in clinic as well as review of medication education as stated above on 09/24/21 if patient is discharged from the hospital by  that time for next treatment. Provided patient's son and daughter in law with Oral Chemotherapy Clinic phone number.   Leron Croak, PharmD, BCPS Hematology/Oncology Clinical Pharmacist [facility] [phone] 09/20/2021 2:12 PM

## 2021-09-21 ENCOUNTER — Inpatient Hospital Stay: Payer: Commercial Managed Care - PPO

## 2021-09-21 ENCOUNTER — Other Ambulatory Visit: Payer: Self-pay | Admitting: Hematology

## 2021-09-21 ENCOUNTER — Inpatient Hospital Stay: Payer: Commercial Managed Care - PPO | Admitting: Hematology

## 2021-09-21 DIAGNOSIS — E8581 Light chain (AL) amyloidosis: Secondary | ICD-10-CM | POA: Diagnosis not present

## 2021-09-21 DIAGNOSIS — N179 Acute kidney failure, unspecified: Secondary | ICD-10-CM | POA: Diagnosis not present

## 2021-09-21 DIAGNOSIS — N186 End stage renal disease: Secondary | ICD-10-CM | POA: Diagnosis not present

## 2021-09-21 DIAGNOSIS — R7881 Bacteremia: Secondary | ICD-10-CM | POA: Diagnosis not present

## 2021-09-21 DIAGNOSIS — E854 Organ-limited amyloidosis: Secondary | ICD-10-CM | POA: Diagnosis not present

## 2021-09-21 DIAGNOSIS — N08 Glomerular disorders in diseases classified elsewhere: Secondary | ICD-10-CM | POA: Diagnosis not present

## 2021-09-21 LAB — RENAL FUNCTION PANEL
Albumin: 1.5 g/dL — ABNORMAL LOW (ref 3.5–5.0)
Anion gap: 8 (ref 5–15)
BUN: 29 mg/dL — ABNORMAL HIGH (ref 8–23)
CO2: 24 mmol/L (ref 22–32)
Calcium: 8.4 mg/dL — ABNORMAL LOW (ref 8.9–10.3)
Chloride: 103 mmol/L (ref 98–111)
Creatinine, Ser: 4.8 mg/dL — ABNORMAL HIGH (ref 0.61–1.24)
GFR, Estimated: 12 mL/min — ABNORMAL LOW (ref >60)
Glucose, Bld: 89 mg/dL (ref 70–99)
Phosphorus: 5.5 mg/dL — ABNORMAL HIGH (ref 2.5–4.6)
Potassium: 4 mmol/L (ref 3.5–5.1)
Sodium: 135 mmol/L (ref 135–145)

## 2021-09-21 LAB — CBC WITH DIFFERENTIAL/PLATELET
Abs Immature Granulocytes: 0.02 10*3/uL (ref 0.00–0.07)
Basophils Absolute: 0.1 10*3/uL (ref 0.0–0.1)
Basophils Relative: 1 %
Eosinophils Absolute: 0.1 10*3/uL (ref 0.0–0.5)
Eosinophils Relative: 2 %
HCT: 27 % — ABNORMAL LOW (ref 39.0–52.0)
Hemoglobin: 8.3 g/dL — ABNORMAL LOW (ref 13.0–17.0)
Immature Granulocytes: 0 %
Lymphocytes Relative: 28 %
Lymphs Abs: 2.5 10*3/uL (ref 0.7–4.0)
MCH: 26.8 pg (ref 26.0–34.0)
MCHC: 30.7 g/dL (ref 30.0–36.0)
MCV: 87.1 fL (ref 80.0–100.0)
Monocytes Absolute: 0.7 10*3/uL (ref 0.1–1.0)
Monocytes Relative: 8 %
Neutro Abs: 5.4 10*3/uL (ref 1.7–7.7)
Neutrophils Relative %: 61 %
Platelets: 233 10*3/uL (ref 150–400)
RBC: 3.1 MIL/uL — ABNORMAL LOW (ref 4.22–5.81)
RDW: 18.4 % — ABNORMAL HIGH (ref 11.5–15.5)
WBC: 8.8 10*3/uL (ref 4.0–10.5)
nRBC: 0 % (ref 0.0–0.2)

## 2021-09-21 LAB — IRON AND TIBC
Iron: 65 ug/dL (ref 45–182)
Saturation Ratios: 39 % (ref 17.9–39.5)
TIBC: 167 ug/dL — ABNORMAL LOW (ref 250–450)
UIBC: 102 ug/dL

## 2021-09-21 MED ORDER — HEPARIN SODIUM (PORCINE) 1000 UNIT/ML IJ SOLN: 3200.0000 [IU] | Freq: Once | INTRAMUSCULAR | Status: AC

## 2021-09-21 MED ORDER — HEPARIN SODIUM (PORCINE) 1000 UNIT/ML IJ SOLN
INTRAMUSCULAR | Status: AC
  Administered 2021-09-21: 3200 [IU]
  Filled 2021-09-21: qty 4

## 2021-09-21 MED ORDER — SODIUM CHLORIDE 0.9 % IV SOLN
250.0000 mg | Freq: Every day | INTRAVENOUS | Status: AC
  Administered 2021-09-21 – 2021-09-22 (×2): 250 mg via INTRAVENOUS
  Filled 2021-09-21 (×3): qty 20

## 2021-09-21 NOTE — Progress Notes (Signed)
   Palliative Medicine Inpatient Follow Up Note     Chart Reviewed. Patient assessed at the bedside.  No acute distress. Appetite fair. Some situational nausea but states Zofran is effective. Scheduled for dialysis today and plans are to continue outpatient. Undergoing chemotherapy treatments on 9/20 with next scheduled dose on 10/3.   Caleb Simmons expresses plan to discharge home with support of his close friend and son. He will be following up with Dr. Burr Medico at the cancer center. He is hopeful for some stability. Wishes to continue with full code/full scope care at this time and open to ongoing discussions and evaluate his wishes as needed.   Patient is aware Palliative support can be offered at the [facility] when he comes in for regular appointments to see his Oncologist and for treatment. He is appreciative of the ongoing support.   Discussed the importance of continued conversation with family and their  medical providers regarding overall plan of care and treatment options, ensuring decisions are within the context of the patients values and GOCs.  Questions addressed and support provided.    Objective Assessment: Vital Signs Vitals:   09/21/21 1230 09/21/21 1300  BP: (!) 85/52 (!) 88/55  Pulse:    Resp:    Temp:    SpO2:      Intake/Output Summary (Last 24 hours) at 09/21/2021 1324 Last data filed at 09/21/2021 1029 Gross per 24 hour  Intake 360 ml  Output 375 ml  Net -15 ml   Last Weight  Most recent update: 09/21/2021 11:54 AM    Weight  70 kg (154 lb 5.2 oz)            Gen:  NAD CV: Regular rate and rhythm, no murmurs rubs or gallops PULM: clear to auscultation bilaterally. No wheezes/rales/rhonchi ABD: soft/nontender/nondistended/normal bowel sounds Neuro: Alert and oriented x3, mood appropriate   SUMMARY OF RECOMMENDATIONS   Continue with current plan of care, patient is remaining hopeful for some improvement/stability. Open to ongoing goals of care discussions  and support.  Will plan to follow patient outpatient at the [facility].  PMT will continue to support and follow on as needed basis. Goals clear and set. Please secure chat for urgent needs.   Time Total: 35 min   Visit consisted of counseling and education dealing with the complex and emotionally intense issues of symptom management and palliative care in the setting of serious and potentially life-threatening illness.Greater than 50%  of this time was spent counseling and coordinating care related to the above assessment and plan.  Alda Lea, AGPCNP-BC  Palliative Medicine Team [phone]  Palliative Medicine Team providers are available by phone from 7am to 7pm daily and can be reached through the team cell phone. Should this patient require assistance outside of these hours, please call the patient's attending physician.

## 2021-09-21 NOTE — Progress Notes (Signed)
Pt has been scheduled to start at [facility] on Wednesday, Oct 5. Notified pt's son of this info and reminded him that pt will need to arrive at 11:45 for first appointment. Pt is scheduled for chemo on Monday and nephrologist agreeable to pt starting at out-pt HD clinic on Wednesday if pt stable for d/c over the weekend. Discussed pt's case with Cyril Mourning with ONC as well.   Melven Sartorius Renal Navigator [phone]

## 2021-09-21 NOTE — Progress Notes (Signed)
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  She is undergoing dialysis 09/07/2021.  We are dialyzing on a as needed basis.  We are coordinating treatment with his chemotherapy.  Last dialysis was 09/07/2021 with 2.4 L removed.  He is making good urine with 1.9 L    09/15/2021    we discussed dialysis and/or palliative medicine and he wishes to proceed with HD if necessary.  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 MRI suggestive of cardiac involvement   Assessment/ Plan:   #Acute kidney injury, nonoliguric, with history of excessive NSAID's use/BC powders every day.  Peaked creatinine level of 6.95. [name]  He underwent IR guided kidney biopsy on 9/6 complicated by postbiopsy bleeding.  Renal biopsy: AL amyloidosis, lambda light chain composition (glomeruli, interstitium, arteries, and arterioles). Marked IF, 59% global glomerular sclerosis and marked arteriosclerosis.    After extensive conversations regarding goals of care but wants to continue dialysis at this time.  We have discussed that given his complicated medical issues including amyloid involving the heart this will likely continue to be difficult.  Patient has been set up for outpatient dialysis.  He has a tunneled dialysis catheter but not willing to have permanent access placed at this time.  Given his prognosis is likely reasonable.  He is stable to undergo discharge when deemed fit and start dialysis outpatient on Wednesday at [facility].  He may still till Monday to get chemotherapy.  If he misses dialysis on Monday I think this will be fine given he has some residual urine output and does not typically develop acute need for  dialysis.  Undergoing dialysis today.   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  Per primary service     #Pulm edema appears to have resolved with good diuresis     #Anemia, appears to be relatively stable with a hemoglobin is unchanged - receiving IV iron for iron deficiency. May be reasonable to avoid ESA for now given amyloid   #History of nephrolithiasis: Followed by urology   #Metabolic acidosis: resolved on HD    Subjective:   Blood pressure remains low.  Patient feeling okay.  Denies any significant complaints today.   Objective:   BP (!) 85/37   Pulse 81   Temp 98.3 F (36.8 C) (Oral)   Resp 12   Ht 5\' 10"  (1.778 m)   Wt 70 kg   SpO2 100%   BMI 22.14 kg/m   Intake/Output Summary (Last 24 hours) at 09/21/2021 1208 Last data filed at 09/21/2021 1029 Gross per 24 hour  Intake 360 ml  Output 375 ml  Net -15 ml   Weight change: -0.284 kg  Physical Exam: General: Lying in bed in no distress Heart: Normal rate Lungs: bilateral chest rise with no increased work of breathing Abdomen: Soft, nontender Extremities: No LE edema. Neurology: Alert, awake, following commands, no asterixis Vascular Access: Right IJ TC  Imaging: VAS [place] UPPER EXT VEIN MAPPING (PRE-OP AVF)  Result Date: 09/20/2021 UPPER EXTREMITY VEIN MAPPING Patient Name:  ASAHD CAN  Date of Exam:  09/20/2021 Medical Rec #: [id number]     Accession #:    [id number] Date of Birth: [birth year]      Patient Gender: M Patient Age:   18 years Exam Location:  [facility] Procedure:      VAS [place] UPPER EXT VEIN MAPPING (PRE-OP AVF) Referring Phys: Otelia Santee --------------------------------------------------------------------------------  Indications: Pre-access. Comparison Study: No prior studies. Performing Technologist: Darlin Coco RDMS, RVT  Examination Guidelines: A complete  evaluation includes B-mode imaging, spectral Doppler, color Doppler, and power Doppler as needed of all accessible portions of each vessel. Bilateral testing is considered an integral part of a complete examination. Limited examinations for reoccurring indications may be performed as noted. +-----------------+-------------+----------+---------+ Right Cephalic   Diameter (cm)Depth (cm)Findings  +-----------------+-------------+----------+---------+ Shoulder             0.33        0.97             +-----------------+-------------+----------+---------+ Prox upper arm       0.36        0.75             +-----------------+-------------+----------+---------+ Mid upper arm        0.27        0.45   branching +-----------------+-------------+----------+---------+ Dist upper arm       0.23        0.20   branching +-----------------+-------------+----------+---------+ Antecubital fossa    0.23        0.17             +-----------------+-------------+----------+---------+ Prox forearm         0.21        0.24   branching +-----------------+-------------+----------+---------+ Mid forearm          0.25        0.27             +-----------------+-------------+----------+---------+ Dist forearm         0.19        0.32             +-----------------+-------------+----------+---------+ Wrist                0.20        0.29             +-----------------+-------------+----------+---------+ +-----------------+-------------+----------+---------+ Left Cephalic    Diameter (cm)Depth (cm)Findings  +-----------------+-------------+----------+---------+ Shoulder             0.25        1.23             +-----------------+-------------+----------+---------+ Prox upper arm       0.31        0.88             +-----------------+-------------+----------+---------+ Mid upper arm        0.37        0.52             +-----------------+-------------+----------+---------+ Dist  upper arm     0.30/0.32  0.49/0.78 branching +-----------------+-------------+----------+---------+ Antecubital fossa    0.25        0.24   branching +-----------------+-------------+----------+---------+ Prox forearm         0.29        0.23             +-----------------+-------------+----------+---------+ Mid forearm        0.26/0.41  0.34/0.26 branching +-----------------+-------------+----------+---------+ Dist forearm         0.30        0.12             +-----------------+-------------+----------+---------+  Wrist                0.33        0.10             +-----------------+-------------+----------+---------+ *See table(s) above for measurements and observations.  Diagnosing physician: Orlie Pollen Electronically signed by Orlie Pollen on 09/20/2021 at 3:02:46 PM.    Final     Labs: BMET Recent Labs  Lab 09/15/21 0144 09/16/21 0139 09/16/21 5929 09/17/21 0514 09/18/21 0410 09/20/21 0225 09/21/21 0212  NA 132* 135 133* 134* 133* 136 135  K 4.8 5.8* 5.3* 5.1 4.9 4.0 4.0  CL 100 101 101 100 98 102 103  CO2 22 21* 23 22 23 25 24   GLUCOSE 95 91 83 91 108* 94 89  BUN 39* 42* 39* 41* 45* 21 29*  CREATININE 5.37* 5.48* 5.69* 5.91* 6.05* 4.09* 4.80*  CALCIUM 8.1* 8.2* 8.6* 8.3* 8.6* 8.2* 8.4*  PHOS 5.3* 5.9*  --  6.5* 6.3* 4.8* 5.5*   CBC Recent Labs  Lab 09/17/21 0514 09/18/21 0410 09/20/21 0225 09/21/21 0212  WBC 10.0 12.0* 9.2 8.8  NEUTROABS 6.6 9.7* 5.9 5.4  HGB 9.3* 9.3* 8.3* 8.3*  HCT 29.4* 28.9* 26.9* 27.0*  MCV 85.0 83.5 86.2 87.1  PLT 377 347 254 233    Medications:     acyclovir  200 mg Oral Q12H   Chlorhexidine Gluconate Cloth  6 each Topical Q0600   feeding supplement (NEPRO CARB STEADY)  237 mL Oral TID BM   folic acid  2 mg Oral Daily   Gerhardt's butt cream   Topical BID   heparin injection (subcutaneous)  5,000 Units Subcutaneous Q8H   melatonin  5 mg Oral QHS   nicotine  14 mg Transdermal Q24H   pantoprazole  40 mg Oral BID  AC   tamsulosin  0.4 mg Oral Daily      Reesa Chew  09/21/2021, 12:08 PM

## 2021-09-21 NOTE — Progress Notes (Addendum)
HEMATOLOGY-ONCOLOGY PROGRESS NOTE  SUBJECTIVE: [name] was seen during dialysis.  He reports that he feels fair overall but reports mild nausea today.  He wants Zofran when he returns to his room.  Denies vomiting.  REVIEW OF SYSTEMS:   Constitutional: Denies fevers, chills  Eyes: Denies blurriness of vision Ears, nose, mouth, throat, and face: Denies mucositis or sore throat Respiratory: Denies cough, dyspnea or wheezes Cardiovascular: Denies palpitation, chest discomfort Gastrointestinal: Reports nausea, denies vomiting Skin: Denies abnormal skin rashes Lymphatics: Denies new lymphadenopathy or easy bruising Neurological:Denies numbness, tingling or new weaknesses Behavioral/Psych: Mood is stable, no new changes  Extremities: No lower extremity edema All other systems were reviewed with the patient and are negative.  I have reviewed the past medical history, past surgical history, social history and family history with the patient and they are unchanged from previous note.   PHYSICAL EXAMINATION: ECOG PERFORMANCE STATUS: 2 - Symptomatic, <50% confined to bed  Vitals:   09/21/21 0400 09/21/21 0914  BP: 122/71 116/67  Pulse: 85 78  Resp: 18 18  Temp: 98.5 F (36.9 C) 97.9 F (36.6 C)  SpO2: 99% 97%   Filed Weights   09/19/21 1107 09/20/21 0500 09/21/21 0400  Weight: 71.3 kg 71 kg 70.7 kg    Intake/Output from previous day: 09/29 0701 - 09/30 0700 In: -  Out: 725 [Urine:725]  GENERAL:alert, no distress and comfortable, dialysis catheter in the right subclavicular  SKIN: skin color, texture, turgor are normal, no rashes or significant lesions EYES: normal, Conjunctiva are pink and non-injected, sclera clear OROPHARYNX:no exudate, no erythema and lips, buccal mucosa, and tongue normal  LUNGS: clear to auscultation and percussion with normal breathing effort HEART: regular rate & rhythm and no murmurs and no lower extremity edema ABDOMEN:abdomen soft, non-tender and  normal bowel sounds NEURO: alert & oriented x 3 with fluent speech, no focal motor/sensory deficits  LABORATORY DATA:  I have reviewed the data as listed CMP Latest Ref Rng & Units 09/21/2021 09/20/2021 09/18/2021  Glucose 70 - 99 mg/dL 89 94 108(H)  BUN 8 - 23 mg/dL 29(H) 21 45(H)  Creatinine 0.61 - 1.24 mg/dL 4.80(H) 4.09(H) 6.05(H)  Sodium 135 - 145 mmol/L 135 136 133(L)  Potassium 3.5 - 5.1 mmol/L 4.0 4.0 4.9  Chloride 98 - 111 mmol/L 103 102 98  CO2 22 - 32 mmol/L 24 25 23   Calcium 8.9 - 10.3 mg/dL 8.4(L) 8.2(L) 8.6(L)  Total Protein 6.5 - 8.1 g/dL - - -  Total Bilirubin 0.3 - 1.2 mg/dL - - -  Alkaline Phos 38 - 126 U/L - - -  AST 15 - 41 U/L - - -  ALT 0 - 44 U/L - - -    Lab Results  Component Value Date   WBC 8.8 09/21/2021   HGB 8.3 (L) 09/21/2021   HCT 27.0 (L) 09/21/2021   MCV 87.1 09/21/2021   PLT 233 09/21/2021   NEUTROABS 5.4 09/21/2021    CT ABDOMEN PELVIS WO CONTRAST  Result Date: 08/30/2021 CLINICAL DATA:  Retroperitoneal hematoma, follow up s/p random renal bx with perinephric hematoma development EXAM: CT ABDOMEN AND PELVIS WITHOUT CONTRAST TECHNIQUE: Multidetector CT imaging of the abdomen and pelvis was performed following the standard protocol without IV contrast. COMPARISON:  September 6 FINDINGS: Inferior chest: Trace bilateral pleural effusions with right greater than left compressive subsegmental atelectasis. Hepatobiliary: The liver is normal in size without focal abnormality. No intrahepatic or extrahepatic biliary ductal dilation. The gallbladder appears normal. Spleen: Normal in  size without focal abnormality. Pancreas: No pancreatic ductal dilatation or surrounding inflammatory changes. Adrenals/Urinary Tract: Adrenal glands are unremarkable. The kidneys are normal in size. Tiny perinephric hematoma along the right renal lower pole is unchanged. Hyperdense material in the right collecting system and bladder consistent with blood products, grossly similar.  Stomach/Bowel: The stomach, small bowel and large bowel are normal in caliber without abnormal wall thickening or surrounding inflammatory changes. Reproductive: Prostate is unremarkable. Lymphatic: No enlarged lymph nodes in the abdomen or pelvis. Vasculature: The abdominal aorta is normal in caliber. Aortic atherosclerosis. Other: No abdominopelvic ascites. Musculoskeletal: No aggressive osseous lesions. Degenerative changes at L5-S1. Bone island in the left ilium. IMPRESSION: The small perinephric hematoma along the right renal lower pole is stable, and within expected limits after percutaneous biopsy. However, there remains substantial clot burden within the bladder. Follow-up urology recommendations for management. Electronically Signed   By: Albin Felling M.D.   On: 08/30/2021 14:21   CT ABDOMEN PELVIS WO CONTRAST  Result Date: 08/29/2021 CLINICAL DATA:  Post right-sided renal biopsy, now with hematuria and hypotension. EXAM: CT ABDOMEN AND PELVIS WITHOUT CONTRAST TECHNIQUE: Multidetector CT imaging of the abdomen and pelvis was performed following the standard protocol without IV contrast. COMPARISON:  CT abdomen pelvis-08/23/2021; ultrasound-guided right renal biopsy-earlier same day FINDINGS: The lack of intravenous contrast limits the ability to evaluate solid abdominal organs. Lower chest: Limited visualization of the lower thorax demonstrates interval development of trace bilateral effusion with worsening bibasilar heterogeneous/consolidative opacities, right greater than left. Previously identified nonspecific ground-glass opacities within the imaged lung bases is not seen on the present examination though there is mild residual intraseptal thickening. Normal heart size. Trace amount of pericardial fluid, unchanged presumably physiologic. There is diffuse decreased attenuation intra cardiac blood pool suggestive of anemia. Hepatobiliary: Normal hepatic contour. Apparent high density material within  the gallbladder could represent biliary sludge. No definitive gallbladder wall thickening or pericholecystic stranding on this noncontrast examination. No ascites. Pancreas: Normal noncontrast appearance of the pancreas. Spleen: Normal noncontrast appearance of the spleen. Adrenals/Urinary Tract: There is a very tiny (approximately 2.7 x 1.6 x 1.2 cm) perinephric hematoma about the inferior pole of the right kidney (axial image 44, series 3; coronal image 57, series 6), with minimal amount of adjacent perinephric stranding. High-density material is seen within the right renal collecting system and ureter with moderate to large amount of layering high-density material within urinary bladder, findings compatible with hemorrhage into the collecting system and bladder. Mild associated right-sided pelviectasis and ureterectasis. Normal noncontrast appearance of the left kidney. No evidence of left-sided nephrolithiasis or urinary obstruction. Normal noncontrast appearance of the bilateral adrenal glands. Stomach/Bowel: Scattered minimal colonic diverticulosis without evidence of superimposed acute diverticulitis on this noncontrast examination. Normal appearance of the terminal ileum. The appendix is not visualized compatible with provided operative history. No discrete areas of bowel wall thickening on this noncontrast examination. No pneumoperitoneum, pneumatosis or portal venous gas. Vascular/Lymphatic: Moderate amount of atherosclerotic plaque within normal caliber abdominal aorta. Scattered retroperitoneal lymph nodes are numerous though individually not enlarged by size criteria with index left sided periaortic lymph node measuring 0.7 cm in greatest short axis diameter (image 31, series 3), presumably reactive in etiology. No bulky retroperitoneal, mesenteric, pelvic or inguinal lymphadenopathy on this noncontrast examination Reproductive: Dystrophic calcifications within normal sized prostate gland. Trace amount  of fluid within the pelvic cul-de-sac. Other: Small bilateral mesenteric fat containing inguinal hernias, left greater than right. Minimal amount of subcutaneous edema about the midline  of the low back. Presumed shrapnel is seen within the right lower abdominal/pelvic ventral abdominal wall. Musculoskeletal: No acute or aggressive osseous abnormalities. Mild-to-moderate multilevel lumbar spine DDD, worse at L4-L5 and L5-S1 with disc space height loss, endplate irregularity and small posteriorly directed disc osteophyte complexes at these locations. Mild degenerative change the bilateral hips with joint space loss, subchondral sclerosis and osteophytosis, right greater than left. IMPRESSION: 1. Post right-sided renal biopsy complicated by tiny (approximately 2.7 cm) perinephric hematoma and bleeding into the right renal collecting system including moderate to large-sized clot within the urinary bladder and associated mild right-sided pelviectasis and ureterectasis. Consideration for initiation of continuous bladder irrigation could be performed as indicated. 2. Trace bilateral effusions with associated bibasilar opacities, right greater than left, likely atelectasis. 3. Colonic diverticulosis without evidence superimposed acute diverticulitis. 4.  Aortic Atherosclerosis (ICD10-I70.0). Critical Value/emergent results were called by telephone at the time of interpretation on 08/28/2021 at 5:04 pm to provider [facility] , who verbally acknowledged these results. Electronically Signed   By: Sandi Mariscal M.D.   On: 08/29/2021 10:38   DG Chest 2 View  Result Date: 09/06/2021 CLINICAL DATA:  Fever EXAM: CHEST - 2 VIEW COMPARISON:  09/03/2021 FINDINGS: Right IJ dialysis catheter tip med right atrium. Midline trachea. Normal heart size. Left costophrenic angle minimally excluded the frontal radiograph. Small bilateral pleural effusions. No pneumothorax. Improved interstitial edema with minimal pulmonary venous  congestion remaining. Persistent mild bibasilar atelectasis. IMPRESSION: Improved interstitial edema with mild pulmonary venous congestion remaining. Small bilateral pleural effusions with adjacent atelectasis. Electronically Signed   By: Abigail Miyamoto M.D.   On: 09/06/2021 13:49   DG Chest 2 View  Result Date: 08/23/2021 CLINICAL DATA:  Near syncope. EXAM: CHEST - 2 VIEW COMPARISON:  None. FINDINGS: The lungs are clear without focal pneumonia, edema, pneumothorax or pleural effusion. Cardiopericardial silhouette is at upper limits of normal for size. The visualized bony structures of the thorax show no acute abnormality. Telemetry leads overlie the chest. IMPRESSION: No active cardiopulmonary disease. Electronically Signed   By: Misty Stanley M.D.   On: 08/23/2021 09:15   NM Pulmonary Perfusion  Result Date: 08/23/2021 CLINICAL DATA:  PE suspected, shortness of breath EXAM: NUCLEAR MEDICINE PERFUSION LUNG SCAN TECHNIQUE: Perfusion images were obtained in multiple projections after intravenous injection of radiopharmaceutical. Ventilation scans intentionally deferred if perfusion scan and chest x-ray adequate for interpretation during COVID 19 epidemic. RADIOPHARMACEUTICALS:  4.2 mCi Tc-37mMAA IV COMPARISON:  Same-day chest radiographs FINDINGS: Normal, homogeneous pulmonary perfusion. No suspicious filling defects. IMPRESSION: Very low probability examination for pulmonary embolism by modified perfusion only PIOPED criteria (PE absent). Electronically Signed   By: AEddie CandleM.D.   On: 08/23/2021 15:59   IR Angiogram Renal Left Selective  INDICATION: 70year old male with history of acute kidney injury of uncertain etiology status post ultrasound-guided right renal biopsy on 08/28/2021. Since biopsy, the patient has experienced gross hematuria with associated acute anemia.   EXAM: 1. Ultrasound-guided vascular access of the right internal jugular vein. 2. Temporary hemodialysis catheter placement. 3.  Ultrasound-guided vascular access of the right common femoral artery. 4. Selective catheterization and angiography of the right renal artery. 5. Sub selective catheterization angiography of right inferior polar and arcuate artery branches. 6. Coil embolization of right inferior pole arcuate artery branch.   MEDICATIONS: None.   ANESTHESIA/SEDATION: Moderate (conscious) sedation was employed during this procedure. A total of Versed 3 mg and Fentanyl 50 mcg was administered intravenously.   Moderate Sedation  Time: 66 minutes. The patient's level of consciousness and vital signs were monitored continuously by radiology nursing throughout the procedure under my direct supervision.   CONTRAST:  1m OMNIPAQUE IOHEXOL 350 MG/ML SOLN, 525mOMNIPAQUE IOHEXOL 350 MG/ML SOLN   FLUOROSCOPY TIME:  Fluoroscopy Time: 10.6 minutes, (811 mGy).   COMPLICATIONS: None immediate.   PROCEDURE: Informed consent was obtained from the patient following explanation of the procedure, risks, benefits and alternatives. The patient understands, agrees and consents for the procedure. All questions were addressed. A time out was performed prior to the initiation of the procedure. Maximal barrier sterile technique utilized including caps, mask, sterile gowns, sterile gloves, large sterile drape, hand hygiene, and chlorhexidine prep.   Preprocedure ultrasound evaluation of the right internal jugular vein demonstrated a patent and compressible vein free of internal echoes. Procedure was planned. Subdermal Local anesthesia was provided 1% lidocaine. A small skin nick was made. Under direct ultrasound visualization, a 21 gauge micropuncture needle was directed into the internal jugular vein. An image was captured and stored in the permanent record. A micropuncture set was inserted and exchanged for a J wire which was positioned in the inferior vena cava. Serial dilation was performed followed by placement of a 12.5 French, 24 cm Trialysis catheter.  The catheter tip was positioned in the right atrium. Each lumen flushed and aspirated appropriately. The dialysis ports were locked with appropriate volume of heparin dwell. The middle central venous port was then used for sedation for the remainder of the procedure. The catheter was secured with a 0 silk retention suture. A sterile bandage was applied.   Preprocedure ultrasound evaluation of the right groin was performed which demonstrated a patent right common femoral artery. The procedure was planned. Subdermal Local anesthesia was provided with 1% lidocaine. A small skin nick was made. Under direct ultrasound visualization, a 21 gauge micropuncture needle was directed into the common femoral artery. An ultrasound image was captured and stored in the permanent record. A micro puncture set was inserted and a limited right lower extremity angiogram was performed which demonstrated appropriate puncture site for closure device use. A J wire was directed to the abdominal aorta and the micropuncture set was exchanged for a 5 Fr[place]ascular sheath. A 5 French C2 catheter was then directed into the right renal ostium. Right renal angiogram was performed. The single main renal artery was patent. 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   A straight lantern microcatheter and 0.014" soft synchro wire was then inserted and directed into the inferior polar branch. Repeat angiogram was performed in the sub selective location which was significant for multifocal 2-4 mm pseudoaneurysm formation, abnormal truncation and irregularity of the arcuate and interlobular branches, an early arteriovenous shunting. The inferior polar arcuate branch was selected further. Coil embolization was performed with multiple low profile  Penumbra Ruby coils ranging from 2-3 mm in diameter. Completion right inferior renal angiogram was performed which demonstrated appropriate embolization of the targeted vessels without persistent arteriovenous fistula or vessel irregularity.   The catheters were removed. The right common femoral artery was then closed with a 6 FrPakistanngio-Seal device. Distal pulses were unchanged. The patient tolerated the procedure well was transferred back to the floor in good condition.   IMPRESSION: 1. 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 2. Sub selective coil embolization of right inferior polar arcuate artery branch. 3. Successful placement of right internal jugular, 24 French Trialysis catheter with the catheter tip in the right atrium. The catheter is ready for immediate use.   Ruthann Cancer, MD   [facility]   [facility]     Electronically Signed   By: Ruthann Cancer M.D.   On: 08/31/2021 09:29    IR Fluoro Guide CV Line Right  Result Date: 09/18/2021 INDICATION: 70 year old male referred for tunneled hemodialysis catheter EXAM: TUNNELED CENTRAL VENOUS HEMODIALYSIS CATHETER PLACEMENT WITH ULTRASOUND AND FLUOROSCOPIC GUIDANCE MEDICATIONS: 2 g Ancef. The antibiotic was given in an appropriate time interval prior to skin puncture. ANESTHESIA/SEDATION: Moderate (conscious) sedation was employed during this procedure. A total of Versed 4.0 mg and Fentanyl 200 mcg was administered intravenously. Moderate Sedation Time: 20 minutes. The patient's level of consciousness and vital signs were monitored continuously by radiology nursing throughout the procedure under my direct supervision. FLUOROSCOPY TIME:  Fluoroscopy Time: 0 minutes 30 seconds (2 mGy). COMPLICATIONS: None PROCEDURE: Informed written consent was obtained from the  patient after a discussion of the risks, benefits, and alternatives to treatment. Questions regarding the procedure were encouraged and answered. The right neck and chest were prepped with chlorhexidine in a sterile fashion, and a sterile drape was applied covering the operative field. Maximum barrier sterile technique with sterile gowns and gloves were used for the procedure. A timeout was performed prior to the initiation of the procedure. Ultrasound survey was performed. Micropuncture kit was utilized to access the right internal jugular vein under direct, real-time ultrasound guidance after the overlying soft tissues were anesthetized with 1% lidocaine with epinephrine. Stab incision was made with 11 blade scalpel. Microwire would not pass centrally. Once we confirmed that the tip of the needle was within the partially occluded IJ, a Nitrex wire was passed into the right heart. The needle was removed and the micro puncture was set was advanced onto the Nitrex wire. Nitrex wire was then exchanged for the standard microwire. The microwire was then marked to measure appropriate internal catheter length. External tunneled length was estimated. A total tip to cuff length of 19 cm was selected. 035 guidewire was advanced to the level of the IVC. Skin and subcutaneous tissues of chest wall below the clavicle were generously infiltrated with 1% lidocaine for local anesthesia. A small stab incision was made with 11 blade scalpel. The selected hemodialysis catheter was tunneled in a retrograde fashion from the anterior chest wall to the venotomy incision. Serial dilation was performed and then a peel-away sheath was placed. The catheter was then placed through the peel-away sheath with tips ultimately positioned within the superior aspect of the right atrium. Final catheter positioning was confirmed and documented with a spot radiographic image. The catheter aspirates and flushes normally. The catheter was flushed with  appropriate volume heparin dwells. The catheter exit site was secured with a 0-Prolene retention suture. Gel-Foam slurry was infused into the soft tissue tract. The venotomy incision was closed Derma bond and sterile dressing. Dressings were applied at the chest wall. Patient tolerated the procedure well and remained hemodynamically stable throughout. No complications were encountered and no significant blood loss encountered. IMPRESSION: Status post image guided placement of right IJ tunneled hemodialysis catheter. Signed, Dulcy Fanny. Dellia Nims, [place] [facility] Electronically Signed  By: Corrie Mckusick D.O.   On: 09/18/2021 13:03   IR Fluoro Guide CV Line Right  Result Date: 08/31/2021 INDICATION: 70 year old male with history of acute kidney injury of uncertain etiology status post ultrasound-guided right renal biopsy on 08/28/2021. Since biopsy, the patient has experienced gross hematuria with associated acute anemia. EXAM: 1. Ultrasound-guided vascular access of the right internal jugular vein. 2. Temporary hemodialysis catheter placement. 3. Ultrasound-guided vascular access of the right common femoral artery. 4. Selective catheterization and angiography of the right renal artery. 5. Sub selective catheterization angiography of right inferior polar and arcuate artery branches. 6. Coil embolization of right inferior pole arcuate artery branch. MEDICATIONS: None. ANESTHESIA/SEDATION: Moderate (conscious) sedation was employed during this procedure. A total of Versed 3 mg and Fentanyl 50 mcg was administered intravenously. Moderate Sedation Time: 66 minutes. The patient's level of consciousness and vital signs were monitored continuously by radiology nursing throughout the procedure under my direct supervision. CONTRAST:  29m OMNIPAQUE IOHEXOL 350 MG/ML SOLN, 553mOMNIPAQUE IOHEXOL 350 MG/ML SOLN FLUOROSCOPY TIME:  Fluoroscopy Time: 10.6 minutes, (811  mGy). COMPLICATIONS: None immediate. PROCEDURE: Informed consent was obtained from the patient following explanation of the procedure, risks, benefits and alternatives. The patient understands, agrees and consents for the procedure. All questions were addressed. A time out was performed prior to the initiation of the procedure. Maximal barrier sterile technique utilized including caps, mask, sterile gowns, sterile gloves, large sterile drape, hand hygiene, and chlorhexidine prep. Preprocedure ultrasound evaluation of the right internal jugular vein demonstrated a patent and compressible vein free of internal echoes. Procedure was planned. Subdermal Local anesthesia was provided 1% lidocaine. A small skin nick was made. Under direct ultrasound visualization, a 21 gauge micropuncture needle was directed into the internal jugular vein. An image was captured and stored in the permanent record. A micropuncture set was inserted and exchanged for a J wire which was positioned in the inferior vena cava. Serial dilation was performed followed by placement of a 12.5 French, 24 cm Trialysis catheter. The catheter tip was positioned in the right atrium. Each lumen flushed and aspirated appropriately. The dialysis ports were locked with appropriate volume of heparin dwell. The middle central venous port was then used for sedation for the remainder of the procedure. The catheter was secured with a 0 silk retention suture. A sterile bandage was applied. Preprocedure ultrasound evaluation of the right groin was performed which demonstrated a patent right common femoral artery. The procedure was planned. Subdermal Local anesthesia was provided with 1% lidocaine. A small skin nick was made. Under direct ultrasound visualization, a 21 gauge micropuncture needle was directed into the common femoral artery. An ultrasound image was captured and stored in the permanent record. A micro puncture set was inserted and a limited right lower  extremity angiogram was performed which demonstrated appropriate puncture site for closure device use. A J wire was directed to the abdominal aorta and the micropuncture set was exchanged for a 5 Fr[place]ascular sheath. A 5 French C2 catheter was then directed into the right renal ostium. Right renal angiogram was performed. The single main renal artery was patent. 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 A straight lantern microcatheter and 0.014" soft synchro wire was then inserted and directed into the inferior polar branch. Repeat angiogram was performed in the  sub selective location which was significant for multifocal 2-4 mm pseudoaneurysm formation, abnormal truncation and irregularity of the arcuate and interlobular branches, an early arteriovenous shunting. The inferior polar arcuate branch was selected further. Coil embolization was performed with multiple low profile Penumbra Ruby coils ranging from 2-3 mm in diameter. Completion right inferior renal angiogram was performed which demonstrated appropriate embolization of the targeted vessels without persistent arteriovenous fistula or vessel irregularity. The catheters were removed. The right common femoral artery was then closed with a 6 [place] Angio-Seal device. Distal pulses were unchanged. The patient tolerated the procedure well was transferred back to the floor in good condition. IMPRESSION: 1. 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 2. Sub selective coil embolization of right inferior polar arcuate artery branch. 3. Successful placement of right internal jugular, 24 French Trialysis catheter  with the catheter tip in the right atrium. The catheter is ready for immediate use. Ruthann Cancer, MD [facility] Radiology Electronically Signed   By: Ruthann Cancer M.D.   On: 08/31/2021 09:29   IR US Guide Vasc Access Right  Result Date: 09/18/2021 INDICATION: 70 year old male referred for tunneled hemodialysis catheter EXAM: TUNNELED CENTRAL VENOUS HEMODIALYSIS CATHETER PLACEMENT WITH ULTRASOUND AND FLUOROSCOPIC GUIDANCE MEDICATIONS: 2 g Ancef. The antibiotic was given in an appropriate time interval prior to skin puncture. ANESTHESIA/SEDATION: Moderate (conscious) sedation was employed during this procedure. A total of Versed 4.0 mg and Fentanyl 200 mcg was administered intravenously. Moderate Sedation Time: 20 minutes. The patient's level of consciousness and vital signs were monitored continuously by radiology nursing throughout the procedure under my direct supervision. FLUOROSCOPY TIME:  Fluoroscopy Time: 0 minutes 30 seconds (2 mGy). COMPLICATIONS: None PROCEDURE: Informed written consent was obtained from the patient after a discussion of the risks, benefits, and alternatives to treatment. Questions regarding the procedure were encouraged and answered. The right neck and chest were prepped with chlorhexidine in a sterile fashion, and a sterile drape was applied covering the operative field. Maximum barrier sterile technique with sterile gowns and gloves were used for the procedure. A timeout was performed prior to the initiation of the procedure. Ultrasound survey was performed. Micropuncture kit was utilized to access the right internal jugular vein under direct, real-time ultrasound guidance after the overlying soft tissues were anesthetized with 1% lidocaine with epinephrine. Stab incision was made with 11 blade scalpel. Microwire would not pass centrally. Once we confirmed that the tip of the needle was within the partially occluded IJ, a Nitrex wire  was passed into the right heart. The needle was removed and the micro puncture was set was advanced onto the Nitrex wire. Nitrex wire was then exchanged for the standard microwire. The microwire was then marked to measure appropriate internal catheter length. External tunneled length was estimated. A total tip to cuff length of 19 cm was selected. 035 guidewire was advanced to the level of the IVC. Skin and subcutaneous tissues of chest wall below the clavicle were generously infiltrated with 1% lidocaine for local anesthesia. A small stab incision was made with 11 blade scalpel. The selected hemodialysis catheter was tunneled in a retrograde fashion from the anterior chest wall to the venotomy incision. Serial dilation was performed and then a peel-away sheath was placed. The catheter was then placed through the peel-away sheath with tips ultimately positioned within the superior aspect of the right atrium. Final catheter  positioning was confirmed and documented with a spot radiographic image. The catheter aspirates and flushes normally. The catheter was flushed with appropriate volume heparin dwells. The catheter exit site was secured with a 0-Prolene retention suture. Gel-Foam slurry was infused into the soft tissue tract. The venotomy incision was closed Derma bond and sterile dressing. Dressings were applied at the chest wall. Patient tolerated the procedure well and remained hemodynamically stable throughout. No complications were encountered and no significant blood loss encountered. IMPRESSION: Status post image guided placement of right IJ tunneled hemodialysis catheter. Signed, Dulcy Fanny. Dellia Nims, RPVI [facility] Radiology Electronically Signed   By: Corrie Mckusick D.O.   On: 09/18/2021 13:03   IR US Guide Vasc Access Right  Result Date: 08/31/2021 INDICATION: 70 year old male with history of acute kidney injury of uncertain etiology status post  ultrasound-guided right renal biopsy on 08/28/2021. Since biopsy, the patient has experienced gross hematuria with associated acute anemia. EXAM: 1. Ultrasound-guided vascular access of the right internal jugular vein. 2. Temporary hemodialysis catheter placement. 3. Ultrasound-guided vascular access of the right common femoral artery. 4. Selective catheterization and angiography of the right renal artery. 5. Sub selective catheterization angiography of right inferior polar and arcuate artery branches. 6. Coil embolization of right inferior pole arcuate artery branch. MEDICATIONS: None. ANESTHESIA/SEDATION: Moderate (conscious) sedation was employed during this procedure. A total of Versed 3 mg and Fentanyl 50 mcg was administered intravenously. Moderate Sedation Time: 66 minutes. The patient's level of consciousness and vital signs were monitored continuously by radiology nursing throughout the procedure under my direct supervision. CONTRAST:  24m OMNIPAQUE IOHEXOL 350 MG/ML SOLN, 528mOMNIPAQUE IOHEXOL 350 MG/ML SOLN FLUOROSCOPY TIME:  Fluoroscopy Time: 10.6 minutes, (811 mGy). COMPLICATIONS: None immediate. PROCEDURE: Informed consent was obtained from the patient following explanation of the procedure, risks, benefits and alternatives. The patient understands, agrees and consents for the procedure. All questions were addressed. A time out was performed prior to the initiation of the procedure. Maximal barrier sterile technique utilized including caps, mask, sterile gowns, sterile gloves, large sterile drape, hand hygiene, and chlorhexidine prep. Preprocedure ultrasound evaluation of the right internal jugular vein demonstrated a patent and compressible vein free of internal echoes. Procedure was planned. Subdermal Local anesthesia was provided 1% lidocaine. A small skin nick was made. Under direct ultrasound visualization, a 21 gauge micropuncture needle was directed into the internal jugular vein. An image was  captured and stored in the permanent record. A micropuncture set was inserted and exchanged for a J wire which was positioned in the inferior vena cava. Serial dilation was performed followed by placement of a 12.5 French, 24 cm Trialysis catheter. The catheter tip was positioned in the right atrium. Each lumen flushed and aspirated appropriately. The dialysis ports were locked with appropriate volume of heparin dwell. The middle central venous port was then used for sedation for the remainder of the procedure. The catheter was secured with a 0 silk retention suture. A sterile bandage was applied. Preprocedure ultrasound evaluation of the right groin was performed which demonstrated a patent right common femoral artery. The procedure was planned. Subdermal Local anesthesia was provided with 1% lidocaine. A small skin nick was made. Under direct ultrasound visualization, a 21 gauge micropuncture needle was directed into the common femoral artery. An ultrasound image was captured and stored in the permanent record. A micro puncture set was inserted and a limited right lower extremity angiogram was performed which demonstrated appropriate puncture site for closure  device use. A J wire was directed to the abdominal aorta and the micropuncture set was exchanged for a 5 [place] vascular sheath. A 5 French C2 catheter was then directed into the right renal ostium. Right renal angiogram was performed. The single main renal artery was patent. 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 A straight lantern microcatheter and 0.014" soft synchro wire was then inserted and directed into the inferior polar branch. Repeat angiogram was performed in the sub selective location which was significant for multifocal 2-4 mm  pseudoaneurysm formation, abnormal truncation and irregularity of the arcuate and interlobular branches, an early arteriovenous shunting. The inferior polar arcuate branch was selected further. Coil embolization was performed with multiple low profile Penumbra Ruby coils ranging from 2-3 mm in diameter. Completion right inferior renal angiogram was performed which demonstrated appropriate embolization of the targeted vessels without persistent arteriovenous fistula or vessel irregularity. The catheters were removed. The right common femoral artery was then closed with a 6 [place] Angio-Seal device. Distal pulses were unchanged. The patient tolerated the procedure well was transferred back to the floor in good condition. IMPRESSION: 1. 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 2. Sub selective coil embolization of right inferior polar arcuate artery branch. 3. Successful placement of right internal jugular, 24 French Trialysis catheter with the catheter tip in the right atrium. The catheter is ready for immediate use. Ruthann Cancer, MD [facility] Radiology Electronically Signed   By: Ruthann Cancer M.D.   On: 08/31/2021 09:29   IR US Guide Vasc Access Right  Result Date: 08/31/2021 INDICATION: 70 year old male with history of acute kidney injury of uncertain etiology status post ultrasound-guided right renal biopsy on 08/28/2021. Since biopsy, the patient has experienced gross hematuria with associated acute anemia. EXAM: 1. Ultrasound-guided vascular access of the right internal jugular vein. 2. Temporary hemodialysis catheter placement. 3. Ultrasound-guided vascular access of the right common femoral artery. 4. Selective catheterization and angiography of the right renal artery. 5. Sub selective catheterization  angiography of right inferior polar and arcuate artery branches. 6. Coil embolization of right inferior pole arcuate artery branch. MEDICATIONS: None. ANESTHESIA/SEDATION: Moderate (conscious) sedation was employed during this procedure. A total of Versed 3 mg and Fentanyl 50 mcg was administered intravenously. Moderate Sedation Time: 66 minutes. The patient's level of consciousness and vital signs were monitored continuously by radiology nursing throughout the procedure under my direct supervision. CONTRAST:  63m OMNIPAQUE IOHEXOL 350 MG/ML SOLN, 532mOMNIPAQUE IOHEXOL 350 MG/ML SOLN FLUOROSCOPY TIME:  Fluoroscopy Time: 10.6 minutes, (811 mGy). COMPLICATIONS: None immediate. PROCEDURE: Informed consent was obtained from the patient following explanation of the procedure, risks, benefits and alternatives. The patient understands, agrees and consents for the procedure. All questions were addressed. A time out was performed prior to the initiation of the procedure. Maximal barrier sterile technique utilized including caps, mask, sterile gowns, sterile gloves, large sterile drape, hand hygiene, and chlorhexidine prep. Preprocedure ultrasound evaluation of the right internal jugular vein demonstrated a patent and compressible vein free of internal echoes. Procedure was planned. Subdermal Local anesthesia was provided 1% lidocaine. A small skin nick was made. Under direct ultrasound visualization, a 21 gauge micropuncture needle was directed into  the internal jugular vein. An image was captured and stored in the permanent record. A micropuncture set was inserted and exchanged for a J wire which was positioned in the inferior vena cava. Serial dilation was performed followed by placement of a 12.5 French, 24 cm Trialysis catheter. The catheter tip was positioned in the right atrium. Each lumen flushed and aspirated appropriately. The dialysis ports were locked with appropriate volume of heparin dwell. The middle central  venous port was then used for sedation for the remainder of the procedure. The catheter was secured with a 0 silk retention suture. A sterile bandage was applied. Preprocedure ultrasound evaluation of the right groin was performed which demonstrated a patent right common femoral artery. The procedure was planned. Subdermal Local anesthesia was provided with 1% lidocaine. A small skin nick was made. Under direct ultrasound visualization, a 21 gauge micropuncture needle was directed into the common femoral artery. An ultrasound image was captured and stored in the permanent record. A micro puncture set was inserted and a limited right lower extremity angiogram was performed which demonstrated appropriate puncture site for closure device use. A J wire was directed to the abdominal aorta and the micropuncture set was exchanged for a 5 [place] vascular sheath. A 5 French C2 catheter was then directed into the right renal ostium. Right renal angiogram was performed. The single main renal artery was patent. 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 A straight lantern microcatheter and 0.014" soft synchro wire was then inserted and directed into the inferior polar branch. Repeat angiogram was performed in the sub selective location which was significant for multifocal 2-4 mm pseudoaneurysm formation, abnormal truncation and irregularity of the arcuate and interlobular branches, an early arteriovenous shunting. The inferior polar arcuate branch was selected further. Coil embolization was performed with multiple low profile Penumbra Ruby coils ranging from 2-3 mm in diameter. Completion right inferior renal angiogram was performed which demonstrated appropriate embolization of the targeted vessels without persistent  arteriovenous fistula or vessel irregularity. The catheters were removed. The right common femoral artery was then closed with a 6 [place] Angio-Seal device. Distal pulses were unchanged. The patient tolerated the procedure well was transferred back to the floor in good condition. IMPRESSION: 1. 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 2. Sub selective coil embolization of right inferior polar arcuate artery branch. 3. Successful placement of right internal jugular, 24 French Trialysis catheter with the catheter tip in the right atrium. The catheter is ready for immediate use. Ruthann Cancer, MD [facility] Special[facility] Radiology Electronically Signed   By: Ruthann Cancer M.D.   On: 08/31/2021 09:29   DG Chest Port 1 View  Result Date: 09/03/2021 CLINICAL DATA:  Shortness of breath EXAM: PORTABLE CHEST 1 VIEW COMPARISON:  08/23/2021 FINDINGS: Right dialysis catheter in place with the tip in the right atrium. Heart is normal size. Mild vascular congestion and interstitial prominence throughout the lungs, likely mild edema. No effusions or acute bony abnormality. IMPRESSION: Suspect mild pulmonary edema. Electronically Signed   By: Rolm Baptise M.D.   On: 09/03/2021 09:40   DG Bone Survey Met  Result Date: 09/08/2021 CLINICAL DATA:  Left hip pain. EXAM: METASTATIC BONE SURVEY  COMPARISON:  None. FINDINGS: No metastatic lesions identified. No cause for left hip pain identified. IMPRESSION: No acute abnormalities identified. No metastatic lesions. No cause for hip pain noted. Electronically Signed   By: Dorise Bullion III M.D.   On: 09/08/2021 14:32   MR CARDIAC MORPHOLOGY WO CONTRAST  Result Date: 09/10/2021 CLINICAL DATA:  Concern for cardiac amyloidosis. EXAM: CARDIAC MRI TECHNIQUE: The patient was scanned on a 1.5 Tesla GE  magnet. A dedicated cardiac coil was used. Functional imaging was done using Fiesta sequences. 2,3, and 4 chamber views were done to assess for RWMA's. Modified Simpson's rule using a short axis stack was used to calculate an ejection fraction on a dedicated work Conservation officer, nature. T1 and T2 sequences done. No contrast due to AKI and unstable renal function. CONTRAST:  None FINDINGS: Limited images of the lung fields showed small bilateral pleural effusions. Small inferior pericardial effusion. Normal left ventricular size with moderate LV hypertrophy. Moderate diffuse hypokinesis with EF 38%. Normal right ventricular size with mild-moderate dysfunction, EF 36%. The aortic valve is trileaflet with no significant stenosis or regurgitation. Normal right atrial size. Mild left atrial enlargement. No significant mitral regurgitation. Measurements: T1 1217 T2 53 LVEDV 134 mL LVSV 51 mL LVEF 38% RVEDV 98 mL RVSV 35 mL RVEF 36% IMPRESSION: 1. Normal LV size with moderate LV hypertrophy. EF 38% with diffuse hypokinesis. 2.  Normal RV size with EF 36%. 3.  T1 elevated, T2 borderline elevated. 4.  Small inferior pericardial effusion. 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 Dalton Mclean Electronically Signed   By: Loralie Champagne M.D.   On: 09/10/2021 17:55   CT BONE MARROW BIOPSY & ASPIRATION  Result Date: 09/05/2021 CLINICAL DATA:  Amyloid kidney EXAM: CT GUIDED DEEP ILIAC BONE ASPIRATION AND CORE BIOPSY TECHNIQUE: Patient was placed prone on the CT gantry and limited axial scans through the pelvis were obtained. Appropriate skin entry site was identified. Skin site was marked, prepped with chlorhexidine, draped in usual sterile fashion, and infiltrated locally with 1% lidocaine. Intravenous Fentanyl 36mg and Versed 225mwere administered as conscious sedation during  continuous monitoring of the patient's level of consciousness and physiological / cardiorespiratory status by the radiology RN, with a total moderate sedation time of 10 minutes. Under CT fluoroscopic guidance an 11-gauge Cook trocar bone needle was advanced into the right iliac bone just lateral to the sacroiliac joint. Once needle tip position was confirmed, core and aspiration samples were obtained, submitted to pathology for approval. Post procedure scans show no hematoma or fracture. Patient tolerated procedure well. COMPLICATIONS: COMPLICATIONS none IMPRESSION: 1. Technically successful CT guided right iliac bone core and aspiration biopsy. Electronically Signed   By: D Lucrezia Europe.D.   On: 09/05/2021 10:48   ECHOCARDIOGRAM COMPLETE  Result Date: 09/08/2021    ECHOCARDIOGRAM REPORT   Patient Name:   Caleb DUTTERate of Exam: 09/08/2021 Medical Rec #:  [id number]  Height:       70.0 in Accession #:    [id number] Weight:       156.5 lb Date of Birth:  06/23/11/1952   BSA:          1.881 m Patient Age:    [age]     BP:           130/78 mmHg Patient Gender: M  HR:           75 bpm. Exam Location:  Inpatient Procedure: 2D Echo, Cardiac Doppler and Color Doppler Indications:    Bacteremia R78.81  History:        Patient has prior history of Echocardiogram examinations, most                 recent 08/24/2021. Risk Factors:Current Smoker. Generalized                 weakness/shortness of breath-found to have AKI and severe                 normocytic anemia.  Sonographer:    Alvino Chapel RCS Referring Phys: [id number] [place]  1. Left ventricular ejection fraction, by estimation, is 45 to 50%. The left ventricle has mildly decreased function. The left ventricle demonstrates global hypokinesis. There is moderate concentric left ventricular hypertrophy. Left ventricular diastolic parameters are consistent with Grade II diastolic dysfunction (pseudonormalization).  2. Right ventricular  systolic function is normal. The right ventricular size is normal. Tricuspid regurgitation signal is inadequate for assessing PA pressure.  3. Left atrial size was moderately dilated.  4. A small to moderate pericardial effusion is present. The pericardial effusion is anterior to the right ventricle. There is no evidence of cardiac tamponade.  5. The mitral valve is grossly normal. Mild mitral valve regurgitation.  6. The aortic valve is tricuspid. There is mild thickening of the aortic valve. Aortic valve regurgitation is not visualized.  7. The inferior vena cava is normal in size with greater than 50% respiratory variability, suggesting right atrial pressure of 3 mmHg.  8. No obvious valvular vegetations. Comparison(s): Prior images reviewed side by side. LVEF mildly reduced and pericardial effusion is new. FINDINGS  Left Ventricle: Left ventricular ejection fraction, by estimation, is 45 to 50%. The left ventricle has mildly decreased function. The left ventricle demonstrates global hypokinesis. The left ventricular internal cavity size was normal in size. There is  moderate concentric left ventricular hypertrophy. Left ventricular diastolic parameters are consistent with Grade II diastolic dysfunction (pseudonormalization). Right Ventricle: The right ventricular size is normal. No increase in right ventricular wall thickness. Right ventricular systolic function is normal. Tricuspid regurgitation signal is inadequate for assessing PA pressure. Left Atrium: Left atrial size was moderately dilated. Right Atrium: Right atrial size was normal in size. Pericardium: A small pericardial effusion is present. The pericardial effusion is anterior to the right ventricle. There is no evidence of cardiac tamponade. Mitral Valve: The mitral valve is grossly normal. There is mild thickening of the mitral valve leaflet(s). Mild mitral annular calcification. Mild mitral valve regurgitation. Tricuspid Valve: The tricuspid valve  is grossly normal. Tricuspid valve regurgitation is trivial. Aortic Valve: The aortic valve is tricuspid. There is mild thickening of the aortic valve. There is mild to moderate aortic valve annular calcification. Aortic valve regurgitation is not visualized. Pulmonic Valve: The pulmonic valve was grossly normal. Pulmonic valve regurgitation is trivial. Aorta: The aortic root is normal in size and structure. Venous: The inferior vena cava is normal in size with greater than 50% respiratory variability, suggesting right atrial pressure of 3 mmHg. IAS/Shunts: No atrial level shunt detected by color flow Doppler.  LEFT VENTRICLE PLAX 2D LVIDd:         5.10 cm  Diastology LVIDs:         3.90 cm  LV e' medial:    3.77 cm/s LV PW:  1.50 cm  LV E/e' medial:  22.9 LV IVS:        1.40 cm  LV e' lateral:   7.18 cm/s LVOT diam:     2.00 cm  LV E/e' lateral: 12.0 LV SV:         51 LV SV Index:   27 LVOT Area:     3.14 cm  RIGHT VENTRICLE RV S prime:     14.40 cm/s TAPSE (M-mode): 1.9 cm LEFT ATRIUM              Index       RIGHT ATRIUM           Index LA diam:        4.20 cm  2.23 cm/m  RA Area:     14.50 cm LA Vol (A2C):   104.0 ml 55.29 ml/m RA Volume:   36.00 ml  19.14 ml/m LA Vol (A4C):   64.4 ml  34.24 ml/m LA Biplane Vol: 83.7 ml  44.50 ml/m  AORTIC VALVE LVOT Vmax:   89.00 cm/s LVOT Vmean:  53.600 cm/s LVOT VTI:    0.161 m  AORTA Ao Root diam: 3.50 cm MITRAL VALVE MV Area (PHT): 3.65 cm    SHUNTS MV Decel Time: 208 msec    Systemic VTI:  0.16 m MV E velocity: 86.40 cm/s  Systemic Diam: 2.00 cm MV A velocity: 63.70 cm/s MV E/A ratio:  1.36 Rozann Lesches MD Electronically signed by Rozann Lesches MD Signature Date/Time: 09/08/2021/5:26:36 PM    Final    ECHOCARDIOGRAM COMPLETE  Result Date: 08/24/2021    ECHOCARDIOGRAM REPORT   Patient Name:   Caleb Simmons Date of Exam: 08/24/2021 Medical Rec #:  [id number]    Height:       70.0 in Accession #:    [id number]   Weight:       180.0 lb Date of Birth:   1951/01/10     BSA:          1.996 m Patient Age:    89 years     BP:           120/69 mmHg Patient Gender: M            HR:           85 bpm. Exam Location:  Inpatient Procedure: 2D Echo, Cardiac Doppler and Color Doppler Indications:    Abnormal EKG  History:        Patient has no prior history of Echocardiogram examinations.                 COPD, Signs/Symptoms:Dyspnea and Weakness, renal disease,                 anemia, elevated troponin; Risk Factors:Current Smoker.  Sonographer:    Dustin Flock RDCS Referring Phys: [place]  Sonographer Comments: Image acquisition challenging due to COPD. IMPRESSIONS  1. Left ventricular ejection fraction, by estimation, is 55 to 60%. The left ventricle has normal function. The left ventricle has no regional wall motion abnormalities. There is mild concentric left ventricular hypertrophy. Left ventricular diastolic parameters are consistent with Grade I diastolic dysfunction (impaired relaxation).  2. Right ventricular systolic function is normal. The right ventricular size is normal. Tricuspid regurgitation signal is inadequate for assessing PA pressure.  3. The mitral valve is grossly normal. No evidence of mitral valve regurgitation. No evidence of mitral stenosis.  4. The aortic valve was not well visualized. Aortic  valve regurgitation is not visualized. No aortic stenosis is present.  5. The inferior vena cava is normal in size with greater than 50% respiratory variability, suggesting right atrial pressure of 3 mmHg. Comparison(s): No prior Echocardiogram. FINDINGS  Left Ventricle: Left ventricular ejection fraction, by estimation, is 55 to 60%. The left ventricle has normal function. The left ventricle has no regional wall motion abnormalities. The left ventricular internal cavity size was normal in size. There is  mild concentric left ventricular hypertrophy. Left ventricular diastolic parameters are consistent with Grade I diastolic dysfunction  (impaired relaxation). Right Ventricle: The right ventricular size is normal. No increase in right ventricular wall thickness. Right ventricular systolic function is normal. Tricuspid regurgitation signal is inadequate for assessing PA pressure. Left Atrium: Left atrial size was normal in size. Right Atrium: Right atrial size was normal in size. Pericardium: There is no evidence of pericardial effusion. Mitral Valve: The mitral valve is grossly normal. No evidence of mitral valve regurgitation. No evidence of mitral valve stenosis. Tricuspid Valve: The tricuspid valve is normal in structure. Tricuspid valve regurgitation is not demonstrated. No evidence of tricuspid stenosis. Aortic Valve: The aortic valve was not well visualized. Aortic valve regurgitation is not visualized. No aortic stenosis is present. Pulmonic Valve: The pulmonic valve was not well visualized. Pulmonic valve regurgitation is not visualized. Aorta: The aortic root is normal in size and structure. Venous: The inferior vena cava is normal in size with greater than 50% respiratory variability, suggesting right atrial pressure of 3 mmHg. IAS/Shunts: The atrial septum is grossly normal.  LEFT VENTRICLE PLAX 2D LVIDd:         5.80 cm      Diastology LVIDs:         3.60 cm      LV e' medial:    5.55 cm/s LV PW:         1.30 cm      LV E/e' medial:  14.5 LV IVS:        1.30 cm      LV e' lateral:   6.74 cm/s LVOT diam:     2.40 cm      LV E/e' lateral: 12.0 LV SV:         76 LV SV Index:   38 LVOT Area:     4.52 cm  LV Volumes (MOD) LV vol d, MOD A4C: 124.0 ml LV vol s, MOD A4C: 47.8 ml LV SV MOD A4C:     124.0 ml RIGHT VENTRICLE RV Basal diam:  2.80 cm RV S prime:     10.10 cm/s TAPSE (M-mode): 2.9 cm LEFT ATRIUM             Index       RIGHT ATRIUM           Index LA diam:        3.80 cm 1.90 cm/m  RA Area:     13.10 cm LA Vol (A2C):   31.8 ml 15.93 ml/m RA Volume:   29.70 ml  14.88 ml/m LA Vol (A4C):   33.0 ml 16.53 ml/m LA Biplane Vol: 34.9 ml  17.49 ml/m  AORTIC VALVE LVOT Vmax:   98.30 cm/s LVOT Vmean:  60.300 cm/s LVOT VTI:    0.169 m  AORTA Ao Root diam: 3.30 cm MITRAL VALVE MV Area (PHT): 3.85 cm    SHUNTS MV Decel Time: 197 msec    Systemic VTI:  0.17 m MV E velocity: 80.60 cm/s  Systemic  Diam: 2.40 cm MV A velocity: 49.30 cm/s MV E/A ratio:  1.63 Rudean Haskell MD Electronically signed by Rudean Haskell MD Signature Date/Time: 08/24/2021/11:38:40 AM    Final    CT Renal Stone Study  Result Date: 08/23/2021 CLINICAL DATA:  Hematuria, new renal failure EXAM: CT ABDOMEN AND PELVIS WITHOUT CONTRAST TECHNIQUE: Multidetector CT imaging of the abdomen and pelvis was performed following the standard protocol without IV contrast. COMPARISON:  06/26/2007 FINDINGS: Lower chest: There is mild, scattered, nonspecific ground-glass and fine nodularity throughout the included bilateral lung bases (series 5, 4). Hepatobiliary: No solid liver abnormality is seen. No gallstones, gallbladder wall thickening, or biliary dilatation. Pancreas: Unremarkable. No pancreatic ductal dilatation or surrounding inflammatory changes. Spleen: Normal in size without significant abnormality. Adrenals/Urinary Tract: Adrenal glands are unremarkable. Kidneys are normal, without renal calculi, solid lesion, or hydronephrosis. Distended urinary bladder, measuring at least 17.4 cm. Stomach/Bowel: Stomach is within normal limits. Appendix not clearly visualized and may be surgically. No evidence of bowel wall thickening, distention, or inflammatory changes. Descending and sigmoid diverticulosis. Vascular/Lymphatic: Aortic atherosclerosis. No enlarged abdominal or pelvic lymph nodes. Reproductive: No mass or other significant abnormality. Other: Small, fat containing bilateral inguinal hernias no abdominopelvic ascites. Musculoskeletal: No acute or significant osseous findings. IMPRESSION: 1. No evidence of urinary tract calculus or hydronephrosis. 2. Distended urinary  bladder, measuring at least 17.4 cm. Correlate for urinary retention. 3. Descending and sigmoid diverticulosis without evidence of acute diverticulitis. 4. Mild, nonspecific scattered ground-glass and fine nodularity throughout the bilateral lung bases, possibly infectious or inflammatory. Aortic Atherosclerosis (ICD10-I70.0). Electronically Signed   By: Eddie Candle M.D.   On: 08/23/2021 16:04   US BIOPSY (KIDNEY)  Result Date: 08/28/2021 INDICATION: Acute kidney injury of uncertain etiology. Please perform image guided biopsy for tissue diagnostic purposes. EXAM: ULTRASOUND GUIDED RENAL BIOPSY COMPARISON:  CT abdomen and pelvis-08/23/2021 MEDICATIONS: None. ANESTHESIA/SEDATION: Fentanyl 100 mcg IV; Versed 2 mg IV Total Moderate Sedation time: 14 minutes; The patient was continuously monitored during the procedure by the interventional radiology nurse under my direct supervision. COMPLICATIONS: None immediate. PROCEDURE: Informed written consent was obtained from the patient after a discussion of the risks, benefits and alternatives to treatment. The patient understands and consents the procedure. A timeout was performed prior to the initiation of the procedure. Ultrasound scanning was performed of the bilateral flanks. The inferior pole of the right kidney was selected for biopsy due to location and sonographic window. The procedure was planned. The operative site was prepped and draped in the usual sterile fashion. The overlying soft tissues were anesthetized with 1% lidocaine with epinephrine. A 17 gauge core needle biopsy device was advanced into the inferior cortex of the right kidney and 3 core biopsies were obtained under direct ultrasound guidance. Images were saved for documentation purposes. The biopsy device was removed and hemostasis was obtained with manual compression. Post procedural scanning was negative for significant post procedural hemorrhage or additional complication. A dressing was placed.  The patient tolerated the procedure well without immediate post procedural complication. IMPRESSION: Technically successful ultrasound guided right renal biopsy. Electronically Signed   By: Sandi Mariscal M.D.   On: 08/28/2021 12:52   IR EMBO ART  VEN HEMORR LYMPH EXTRAV  INC GUIDE ROADMAPPING  Result Date: 08/31/2021 INDICATION: 70 year old male with history of acute kidney injury of uncertain etiology status post ultrasound-guided right renal biopsy on 08/28/2021. Since biopsy, the patient has experienced gross hematuria with associated acute anemia. EXAM: 1. Ultrasound-guided vascular access of the right  internal jugular vein. 2. Temporary hemodialysis catheter placement. 3. Ultrasound-guided vascular access of the right common femoral artery. 4. Selective catheterization and angiography of the right renal artery. 5. Sub selective catheterization angiography of right inferior polar and arcuate artery branches. 6. Coil embolization of right inferior pole arcuate artery branch. MEDICATIONS: None. ANESTHESIA/SEDATION: Moderate (conscious) sedation was employed during this procedure. A total of Versed 3 mg and Fentanyl 50 mcg was administered intravenously. Moderate Sedation Time: 66 minutes. The patient's level of consciousness and vital signs were monitored continuously by radiology nursing throughout the procedure under my direct supervision. CONTRAST:  51m OMNIPAQUE IOHEXOL 350 MG/ML SOLN, 553mOMNIPAQUE IOHEXOL 350 MG/ML SOLN FLUOROSCOPY TIME:  Fluoroscopy Time: 10.6 minutes, (811 mGy). COMPLICATIONS: None immediate. PROCEDURE: Informed consent was obtained from the patient following explanation of the procedure, risks, benefits and alternatives. The patient understands, agrees and consents for the procedure. All questions were addressed. A time out was performed prior to the initiation of the procedure. Maximal barrier sterile technique utilized including caps, mask, sterile gowns, sterile gloves, large  sterile drape, hand hygiene, and chlorhexidine prep. Preprocedure ultrasound evaluation of the right internal jugular vein demonstrated a patent and compressible vein free of internal echoes. Procedure was planned. Subdermal Local anesthesia was provided 1% lidocaine. A small skin nick was made. Under direct ultrasound visualization, a 21 gauge micropuncture needle was directed into the internal jugular vein. An image was captured and stored in the permanent record. A micropuncture set was inserted and exchanged for a J wire which was positioned in the inferior vena cava. Serial dilation was performed followed by placement of a 12.5 French, 24 cm Trialysis catheter. The catheter tip was positioned in the right atrium. Each lumen flushed and aspirated appropriately. The dialysis ports were locked with appropriate volume of heparin dwell. The middle central venous port was then used for sedation for the remainder of the procedure. The catheter was secured with a 0 silk retention suture. A sterile bandage was applied. Preprocedure ultrasound evaluation of the right groin was performed which demonstrated a patent right common femoral artery. The procedure was planned. Subdermal Local anesthesia was provided with 1% lidocaine. A small skin nick was made. Under direct ultrasound visualization, a 21 gauge micropuncture needle was directed into the common femoral artery. An ultrasound image was captured and stored in the permanent record. A micro puncture set was inserted and a limited right lower extremity angiogram was performed which demonstrated appropriate puncture site for closure device use. A J wire was directed to the abdominal aorta and the micropuncture set was exchanged for a 5 [place] sheath. A 5 French C2 catheter was then directed into the right renal ostium. Right renal angiogram was performed. The single main renal artery was patent. About 2 arcuate artery branches in the inferior pole there is  abnormal truncation and vessel irregularity with evidence of an early filling arteriovenous fistula in addition to suggestion of faint filling into the collecting system on delayed imaging. A straight lantern microcatheter and 0.014" soft synchro wire was then inserted and directed into the inferior polar branch. Repeat angiogram was performed in the sub selective location which was significant for multifocal 2-4 mm pseudoaneurysm formation, abnormal truncation and irregularity of the arcuate and interlobular branches, an early arteriovenous shunting. The inferior polar arcuate branch was selected further. Coil embolization was performed with multiple low profile Penumbra Ruby coils ranging from 2-3 mm in diameter. Completion right inferior renal angiogram was performed which demonstrated appropriate embolization  of the targeted vessels without persistent arteriovenous fistula or vessel irregularity. The catheters were removed. The right common femoral artery was then closed with a 6 [place] Angio-Seal device. Distal pulses were unchanged. The patient tolerated the procedure well was transferred back to the floor in good condition. IMPRESSION: 1. 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 2. Sub selective coil embolization of right inferior polar arcuate artery branch. 3. Successful placement of right internal jugular, 24 French Trialysis catheter with the catheter tip in the right atrium. The catheter is ready for immediate use. Ruthann Cancer, MD [facility] Electronically Signed   By: Ruthann Cancer M.D.   On: 08/31/2021 09:29   VAS [place] UPPER EXT VEIN MAPPING (PRE-OP AVF)  Result Date: 09/20/2021 UPPER EXTREMITY VEIN MAPPING Patient Name:  DANZIG MACGREGOR  Date of Exam:   09/20/2021 Medical Rec #: [id number]      Accession #:    [id number] Date of Birth: 1951/12/21      Patient Gender: M Patient Age:   45 years Exam Location:  [facility] Procedure:      VAS [place] UPPER EXT VEIN MAPPING (PRE-OP AVF) Referring Phys: Otelia Santee --------------------------------------------------------------------------------  Indications: Pre-access. Comparison Study: No prior studies. Performing Technologist: Darlin Coco RDMS, RVT  Examination Guidelines: A complete evaluation includes B-mode imaging, spectral Doppler, color Doppler, and power Doppler as needed of all accessible portions of each vessel. Bilateral testing is considered an integral part of a complete examination. Limited examinations for reoccurring indications may be performed as noted. +-----------------+-------------+----------+---------+ Right Cephalic   Diameter (cm)Depth (cm)Findings  +-----------------+-------------+----------+---------+ Shoulder             0.33        0.97             +-----------------+-------------+----------+---------+ Prox upper arm       0.36        0.75             +-----------------+-------------+----------+---------+ Mid upper arm        0.27        0.45   branching +-----------------+-------------+----------+---------+ Dist upper arm       0.23        0.20   branching +-----------------+-------------+----------+---------+ Antecubital fossa    0.23        0.17             +-----------------+-------------+----------+---------+ Prox forearm         0.21        0.24   branching +-----------------+-------------+----------+---------+ Mid forearm          0.25        0.27             +-----------------+-------------+----------+---------+ Dist forearm         0.19        0.32             +-----------------+-------------+----------+---------+ Wrist                0.20        0.29             +-----------------+-------------+----------+---------+ +-----------------+-------------+----------+---------+  Left Cephalic    Diameter (cm)Depth (cm)Findings  +-----------------+-------------+----------+---------+ Shoulder             0.25        1.23             +-----------------+-------------+----------+---------+  Prox upper arm       0.31        0.88             +-----------------+-------------+----------+---------+ Mid upper arm        0.37        0.52             +-----------------+-------------+----------+---------+ Dist upper arm     0.30/0.32  0.49/0.78 branching +-----------------+-------------+----------+---------+ Antecubital fossa    0.25        0.24   branching +-----------------+-------------+----------+---------+ Prox forearm         0.29        0.23             +-----------------+-------------+----------+---------+ Mid forearm        0.26/0.41  0.34/0.26 branching +-----------------+-------------+----------+---------+ Dist forearm         0.30        0.12             +-----------------+-------------+----------+---------+ Wrist                0.33        0.10             +-----------------+-------------+----------+---------+ *See table(s) above for measurements and observations.  Diagnosing physician: Orlie Pollen Electronically signed by Orlie Pollen on 09/20/2021 at 3:02:46 PM.    Final     Surgical Pathology  CASE: [id number]  PATIENT: Caleb Simmons  Bone Marrow Report   Clinical History: Amyloid , right Iliac ([place])   DIAGNOSIS:   BONE MARROW, ASPIRATE, CLOT, CORE:  - Plasma cell myeloma, see comment.   PERIPHERAL BLOOD:  - Normocytic anemia.   COMMENT:   The marrow is normocellular but exhibits increased monoclonal plasma  cells (17% aspirate, 15-20% CD138 immunohistochemistry). The findings  are consistent with plasma cell myeloma. Congo red is pending and will  be reported in an addendum. There is some atypia in the megakaryocytes  and FISH for MDS was added for completeness.   MICROSCOPIC DESCRIPTION:   PERIPHERAL BLOOD  SMEAR: There is a normocytic anemia with occasional  hypochromic cells.  There is no rouleaux formation.  Leukocytes are  present in normal numbers.  Circulating plasma cells are not identified.  Platelets are present in normal numbers.   BONE MARROW ASPIRATE: Spicular and cellular.  Erythroid precursors: Relative decrease in numbers.  No significant  dysplasia.  Granulocytic precursors: Relative increase in numbers.  No significant  dysplasia.  No increase in blasts.  Megakaryocytes: Mild increase in numbers.  Occasional forms with  hypolobated or abnormal nuclei.  Lymphocytes/plasma cells: Plasma cells are increased in numbers (6% by  manual differential counts) with atypical forms (multinucleation, large  forms).  Lymphocytes are not increased.   TOUCH PREPARATIONS: Similar to aspirate smears.   CLOT AND BIOPSY: The core biopsy and clot section are normocellular for  age (30%).  There is a mild myeloid hyperplasia.  Megakaryocytes are  increased in numbers with scattered atypical forms. CD138  immunohistochemistry reveals increased plasma cells (15-20%) which are  scattered and with small clusters. By light chain in situ hybridization  the plasma cells are lambda restricted.   IRON STAIN: Iron stains are performed on a bone marrow aspirate or touch  imprint smear and section of clot. The controls stained appropriately.        Storage Iron: Present       Ring Sideroblasts: Absent   ADDITIONAL DATA/TESTING: Cytogenetics, including FISH for myeloma and  MDS,  was ordered and will be reported in an addendum.   CELL COUNT DATA:   Bone Marrow count performed on 500 cells shows:  Blasts:   0%   Myeloid:  66%  Promyelocytes: 0%   Erythroid:     11%  Myelocytes:    8%   Lymphocytes:   6%  Metamyelocytes:     1%   Plasma cells:  17%  Bands:    8%  Neutrophils:   41%  M:E ratio:     6.0  Eosinophils:   8%  Basophils:     0%  Monocytes:     0%   Lab Data: CBC performed on 09/05/21  shows:  WBC: 8.5 k/uL  Neutrophils:   57%  Hgb: 9.6 g/dL  Lymphocytes:   27%  HCT: 30.3 %    Monocytes:     8%  MCV: 84.6 fL   Eosinophils:   6%  RDW: 17.4 %    Basophils:     2%  PLT: 380 k/uL    ASSESSMENT AND PLAN: 1.  AL amyloidosis/plasma cell myeloma 2.  Anemia secondary to renal insufficiency, iron deficiency, and folate deficiency 3.  Acute kidney injury secondary to #1 4.  History of nephrolithiasis 5.  Streptococcus agalactiae bacteremia diagnosed 09/07/2021   -Pt is clinically stable.  His CBC is stable.  He has restarted hemodialysis due to worsening renal function.  Plan is for outpatient dialysis on Monday, Wednesday, Friday. -Discussed with hospitalist who indicates that the patient will not be discharged prior to 10/3.  Therefore, we will plan to administer chemotherapy again in the hospital on 10/3.  He will receive Cytoxan and Velcade.  Plan to add daratumumab as an outpatient possibly next Thursday. -overall prognosis is poor, I have not see significant clinical benefit of chemo yet after two doses chemo, but it could be too early to tell, will repeat his M-protein and light chain level after 3-4 weeks of treatment.  -use zofran as needed for nausea which works for him  -we will f/u    LOS: 29 days    Caleb Simmons  09/21/2021   Addendum  I have seen the patient, examined him. I agree with the assessment and and plan and have edited the notes.   Discharge plan is pending. If we are not able to find chemo nurse to give Velcade on Monday 10/3, then will give next cycle chemo CyBorD andDara injection in our office next Thursday 10/6. Pt is agreeable.   Caleb Simmons  09/21/2021

## 2021-09-21 NOTE — Progress Notes (Addendum)
[facility] PROGRESS NOTE    Progress Note  [name]  ERD:[id number] DOB: 06/14/1951 DOA: 08/23/2021 PCP: Pcp, No    Brief Narrative:   Caleb Simmons is an 70 y.o. male with no significant medical history, but he does not see a primary care doctor regularly presented to the ED with weakness, noted to have acute kidney injury and severe normocytic anemia . 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  Oncology was consulted, started on chemotherapy 9/20.  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 09/10/2021 cMRI showed possible cardiac amyloid  Assessment/Plan:   AL amyloidosis/plasma cell myeloma: Acute kidney injury -His creatinine worsened throughout this admission, despite good urine output -Amyloidosis diagnosed on renal biopsy, oncology consulted and following, bone marrow biopsy noted plasma cell myeloma, started chemo CyBorD on 9/20, second dose on 9/26, next due on Monday 10/3 -Nephrology following, previously had HD intermittently and then restarted HD 9/28, has a tunneled dialysis catheter now -Overall prognosis felt to be poor considering cardiac involvement as well, seen by palliative care, patient wishes to pursue full aggressive medical treatment at  this time -Potassium stable, improved on daily Lokelma -Counts are stable, continue to monitor -On acyclovir for prophylaxis -CLIPPED [facility] Wednesday Friday  Sepsis  Group B strep bacteremia: -Repeat blood cultures are negative, TTE without vegetation, bacteremia cleared quickly, followed by infectious disease, recommended 10 days of IV penicillin, this was completed on 9/25  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, with a small pericardial effusion: -Followed by cardiology, 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 He required multiple transfusions. Status post embolization on 08/30/2021. -resolved  Anxiety: Continue Xanax, continue trazodone. He is not interested in other medications like SSRI.  Tobacco abuse: Continue nicotine patch.   DVT prophylaxis: Hep SQ Full code Family Communication:no family at bedside, discussed patient in detail Status is: Inpatient  Remains inpatient appropriate because:Hemodynamically unstable  Dispo: The patient is from: Home              Anticipated d/c is to: Home likley Monday after HD and chemo              Patient currently is not medically stable to d/c.   Difficult to place patient No   IV Access:  Peripheral IV   Procedures and diagnostic studies:   VAS [place] UPPER EXT VEIN MAPPING (PRE-OP  AVF)  Result Date: 09/20/2021 UPPER EXTREMITY VEIN MAPPING Patient Name:  Caleb Simmons  Date of Exam:   09/20/2021 Medical Rec #: [id number]     Accession #:    [id number] Date of Birth: 02/11/1951      Patient Gender: M Patient Age:   1 years Exam Location:  [facility] Procedure:      VAS [place] UPPER EXT VEIN MAPPING (PRE-OP AVF) Referring Phys: Otelia Santee --------------------------------------------------------------------------------  Indications: Pre-access. Comparison Study: No prior studies. Performing Technologist: Darlin Coco RDMS, RVT  Examination Guidelines: A complete evaluation includes B-mode imaging, spectral Doppler, color Doppler, and power Doppler as needed of all accessible portions of each vessel. Bilateral testing is considered an integral part of a complete examination. Limited examinations for reoccurring indications may be performed as noted. +-----------------+-------------+----------+---------+ Right Cephalic   Diameter (cm)Depth (cm)Findings  +-----------------+-------------+----------+---------+ Shoulder             0.33        0.97             +-----------------+-------------+----------+---------+ Prox upper arm       0.36        0.75             +-----------------+-------------+----------+---------+ Mid upper arm        0.27        0.45   branching +-----------------+-------------+----------+---------+ Dist upper arm       0.23        0.20   branching +-----------------+-------------+----------+---------+ Antecubital fossa    0.23        0.17             +-----------------+-------------+----------+---------+ Prox forearm         0.21        0.24   branching +-----------------+-------------+----------+---------+ Mid forearm          0.25        0.27             +-----------------+-------------+----------+---------+ Dist forearm         0.19        0.32             +-----------------+-------------+----------+---------+ Wrist                 0.20        0.29             +-----------------+-------------+----------+---------+ +-----------------+-------------+----------+---------+ Left Cephalic    Diameter (cm)Depth (cm)Findings  +-----------------+-------------+----------+---------+ Shoulder             0.25        1.23             +-----------------+-------------+----------+---------+ Prox upper arm       0.31        0.88             +-----------------+-------------+----------+---------+ Mid upper arm        0.37        0.52             +-----------------+-------------+----------+---------+ Dist upper arm     0.30/0.32  0.49/0.78 branching +-----------------+-------------+----------+---------+ Antecubital fossa    0.25        0.24   branching +-----------------+-------------+----------+---------+ Prox forearm         0.29        0.23             +-----------------+-------------+----------+---------+  Mid forearm        0.26/0.41  0.34/0.26 branching +-----------------+-------------+----------+---------+ Dist forearm         0.30        0.12             +-----------------+-------------+----------+---------+ Wrist                0.33        0.10             +-----------------+-------------+----------+---------+ *See table(s) above for measurements and observations.  Diagnosing physician: Orlie Pollen Electronically signed by Orlie Pollen on 09/20/2021 at 3:02:46 PM.    Final      Medical Consultants:   Nephrology, oncology   Subjective:    [name] feels okay overall, tolerated dialysis yesterday  Objective:    Vitals:   09/20/21 1400 09/20/21 2003 09/21/21 0400 09/21/21 0914  BP: 106/68 102/66 122/71 116/67  Pulse: 74 82 85 78  Resp: _0 Temp: 98.5 F (36.9 C) 98.4 F (36.9 C) 98.5 F (36.9 C) 97.9 F (36.6 C)  TempSrc: Oral Oral Oral Oral  SpO2: 99% 97% 99% 97%  Weight:   70.7 kg   Height:       SpO2: 97 % O2 Flow Rate (L/min): 2 L/min   Intake/Output  Summary (Last 24 hours) at 09/21/2021 1117 Last data filed at 09/21/2021 1029 Gross per 24 hour  Intake 360 ml  Output 375 ml  Net -15 ml   Filed Weights   09/19/21 1107 09/20/21 0500 09/21/21 0400  Weight: 71.3 kg 71 kg 70.7 kg    Exam General exam: Chronically ill elderly male sitting up in bed, AAOx3, no distress HEENT: Right IJ HD catheter noted CVS: S1-S2, regular rate rhythm Lungs: Clear bilaterally Abdomen: Soft, nontender, bowel sounds present Extremities: No edema Skin: No rash on exposed skin  Data Reviewed:    Labs: Basic Metabolic Panel: Recent Labs  Lab 09/16/21 0139 09/16/21 0737 09/17/21 0514 09/18/21 0410 09/20/21 0225 09/21/21 0212  NA 135 133* 134* 133* 136 135  K 5.8* 5.3* 5.1 4.9 4.0 4.0  CL 101 101 100 98 102 103  CO2 21* _1 GLUCOSE 91 83 91 108* 94 89  BUN 42* 39* 41* 45* 21 29*  CREATININE 5.48* 5.69* 5.91* 6.05* 4.09* 4.80*  CALCIUM 8.2* 8.6* 8.3* 8.6* 8.2* 8.4*  PHOS 5.9*  --  6.5* 6.3* 4.8* 5.5*   GFR Estimated Creatinine Clearance: 14.1 mL/min (A) (by C-G formula based on SCr of 4.8 mg/dL (H)). Liver Function Tests: Recent Labs  Lab 09/16/21 0139 09/17/21 0514 09/18/21 0410 09/20/21 0225 09/21/21 0212  ALBUMIN 1.6* 1.5* 1.6* <1.5* 1.5*   No results for input(s): LIPASE, AMYLASE in the last 168 hours. No results for input(s): AMMONIA in the last 168 hours. Coagulation profile No results for input(s): INR, PROTIME in the last 168 hours. COVID-19 Labs  No results for input(s): DDIMER, FERRITIN, LDH, CRP in the last 72 hours.  Lab Results  Component Value Date   [place] NEGATIVE 08/23/2021    CBC: Recent Labs  Lab 09/16/21 0139 09/17/21 0514 09/18/21 0410 09/20/21 0225 09/21/21 0212  WBC 11.0* 10.0 12.0* 9.2 8.8  NEUTROABS 7.2 6.6 9.7* 5.9 5.4  HGB 9.3* 9.3* 9.3* 8.3* 8.3*  HCT 29.9* 29.4* 28.9* 26.9* 27.0*  MCV 85.7 85.0 83.5 86.2 87.1  PLT 458* 377 347 254 233   Cardiac Enzymes: No results for  input(s): CKTOTAL,  CKMB, CKMBINDEX, TROPONINI in the last 168 hours. BNP (last 3 results) No results for input(s): PROBNP in the last 8760 hours. CBG: No results for input(s): GLUCAP in the last 168 hours. D-Dimer: No results for input(s): DDIMER in the last 72 hours. Hgb A1c: No results for input(s): HGBA1C in the last 72 hours. Lipid Profile: No results for input(s): CHOL, HDL, LDLCALC, TRIG, CHOLHDL, LDLDIRECT in the last 72 hours. Thyroid function studies: No results for input(s): TSH, T4TOTAL, T3FREE, THYROIDAB in the last 72 hours.  Invalid input(s): FREET3 Anemia work up: No results for input(s): VITAMINB12, FOLATE, FERRITIN, TIBC, IRON, RETICCTPCT in the last 72 hours. Sepsis Labs: Recent Labs  Lab 09/17/21 0514 09/18/21 0410 09/20/21 0225 09/21/21 0212  WBC 10.0 12.0* 9.2 8.8   Microbiology No results found for this or any previous visit (from the past 240 hour(s)).    Medications:    acyclovir  200 mg Oral Q12H   Chlorhexidine Gluconate Cloth  6 each Topical Q0600   feeding supplement (NEPRO CARB STEADY)  237 mL Oral TID BM   folic acid  2 mg Oral Daily   Gerhardt's butt cream   Topical BID   heparin injection (subcutaneous)  5,000 Units Subcutaneous Q8H   melatonin  5 mg Oral QHS   nicotine  14 mg Transdermal Q24H   pantoprazole  40 mg Oral BID AC   tamsulosin  0.4 mg Oral Daily   Continuous Infusions:  sodium chloride 10 mL/hr at 09/17/21 0000   ceFAZolin 2 g (09/18/21 1205)   sodium chloride irrigation        LOS: 29 days   [facility]  09/21/2021, 11:17 AM

## 2021-09-21 NOTE — Care Management (Signed)
[id number] 09-21-21 Previous Heart Failure Case Manager had arranged a [place] RN-disease management for this patient with [place]. Case Manager did make [facility] aware that the patient may transition home today vs tomorrow.

## 2021-09-22 LAB — CBC WITH DIFFERENTIAL/PLATELET
Abs Immature Granulocytes: 0.04 10*3/uL (ref 0.00–0.07)
Basophils Absolute: 0.1 10*3/uL (ref 0.0–0.1)
Basophils Relative: 1 %
Eosinophils Absolute: 0.1 10*3/uL (ref 0.0–0.5)
Eosinophils Relative: 1 %
HCT: 25.7 % — ABNORMAL LOW (ref 39.0–52.0)
Hemoglobin: 8.1 g/dL — ABNORMAL LOW (ref 13.0–17.0)
Immature Granulocytes: 0 %
Lymphocytes Relative: 22 %
Lymphs Abs: 2.2 10*3/uL (ref 0.7–4.0)
MCH: 27.3 pg (ref 26.0–34.0)
MCHC: 31.5 g/dL (ref 30.0–36.0)
MCV: 86.5 fL (ref 80.0–100.0)
Monocytes Absolute: 0.7 10*3/uL (ref 0.1–1.0)
Monocytes Relative: 7 %
Neutro Abs: 6.7 10*3/uL (ref 1.7–7.7)
Neutrophils Relative %: 69 %
Platelets: 215 10*3/uL (ref 150–400)
RBC: 2.97 MIL/uL — ABNORMAL LOW (ref 4.22–5.81)
RDW: 18.1 % — ABNORMAL HIGH (ref 11.5–15.5)
WBC: 9.9 10*3/uL (ref 4.0–10.5)
nRBC: 0 % (ref 0.0–0.2)

## 2021-09-22 LAB — RENAL FUNCTION PANEL
Albumin: 1.5 g/dL — ABNORMAL LOW (ref 3.5–5.0)
Anion gap: 7 (ref 5–15)
BUN: 18 mg/dL (ref 8–23)
CO2: 25 mmol/L (ref 22–32)
Calcium: 7.8 mg/dL — ABNORMAL LOW (ref 8.9–10.3)
Chloride: 102 mmol/L (ref 98–111)
Creatinine, Ser: 3.4 mg/dL — ABNORMAL HIGH (ref 0.61–1.24)
GFR, Estimated: 19 mL/min — ABNORMAL LOW (ref >60)
Glucose, Bld: 95 mg/dL (ref 70–99)
Phosphorus: 4 mg/dL (ref 2.5–4.6)
Potassium: 4.1 mmol/L (ref 3.5–5.1)
Sodium: 134 mmol/L — ABNORMAL LOW (ref 135–145)

## 2021-09-22 NOTE — Progress Notes (Signed)
[facility] PROGRESS NOTE    Progress Note  [name]  ZDG:[id number] DOB: August 12, 1951 DOA: 08/23/2021 PCP: Pcp, No    Brief Narrative:   Caleb Simmons is an 70 y.o. male with no significant medical history, but he does not see a primary care doctor regularly presented to the ED with weakness, noted to have acute kidney injury and severe normocytic anemia . 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  Oncology was consulted, started on chemotherapy 9/20.  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 09/10/2021 cMRI showed possible cardiac amyloid  Assessment/Plan:   AL amyloidosis/plasma cell myeloma: Acute kidney injury -His creatinine worsened throughout this admission, despite good urine output -Amyloidosis diagnosed on renal biopsy, oncology consulted and following, bone marrow biopsy noted plasma cell myeloma, started chemo CyBorD on 9/20, second dose on 9/26, next due on Monday 10/3 -Nephrology following, previously had HD intermittently and then restarted HD 9/28, has a tunneled dialysis catheter now -Overall prognosis felt to be poor considering cardiac involvement as well, seen by palliative care, patient wishes to pursue full aggressive medical treatment at  this time -Potassium stable, improved on daily Lokelma -Counts are stable, continue to monitor -On acyclovir for prophylaxis -CLIPPED to [facility] Monday Wednesday Friday -Discharge planning, anticipate DC home after HD and chemo on Monday, will start outpatient HD at [facility] on Wednesday 10/5  Sepsis  Group B strep bacteremia: -Repeat blood cultures are negative, TTE without vegetation, bacteremia cleared quickly, followed by infectious disease, recommended 10 days of IV penicillin, this was completed on 9/25  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, with a small pericardial effusion: -Followed by cardiology, 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 He required multiple transfusions. Status post embolization on 08/30/2021. -resolved  Anxiety: Continue Xanax, continue trazodone. He is not interested in other medications like SSRI.  Tobacco abuse: Continue nicotine patch.   DVT prophylaxis: Hep SQ Full code Family Communication:no family at bedside, discussed patient in detail Status is: Inpatient  Remains inpatient appropriate because:Hemodynamically unstable  Dispo: The patient is from: Home              Anticipated d/c is to: Home likley Monday after HD and chemo  Patient currently is not medically stable to d/c.   Difficult to place patient  No   IV Access:   Peripheral IV   Procedures and diagnostic studies:   VAS [place] UPPER EXT VEIN MAPPING (PRE-OP AVF)  Result Date: 09/20/2021 [place] MAPPING Patient Name:  Caleb Simmons  Date of Exam:   09/20/2021 Medical Rec #: [id number]     Accession #:    [id number] Date of Birth: 1951-05-01      Patient Gender: M Patient Age:   42 years Exam Location:  [facility] Procedure:      VAS [place] UPPER EXT VEIN MAPPING (PRE-OP AVF) Referring Phys: Otelia Santee --------------------------------------------------------------------------------  Indications: Pre-access. Comparison Study: No prior studies. Performing Technologist: Darlin Coco RDMS, RVT  Examination Guidelines: A complete evaluation includes B-mode imaging, spectral Doppler, color Doppler, and power Doppler as needed of all accessible portions of each vessel. Bilateral testing is considered an integral part of a complete examination. Limited examinations for reoccurring indications may be performed as noted. +-----------------+-------------+----------+---------+ Right Cephalic   Diameter (cm)Depth (cm)Findings  +-----------------+-------------+----------+---------+ Shoulder             0.33        0.97             +-----------------+-------------+----------+---------+ Prox upper arm       0.36        0.75             +-----------------+-------------+----------+---------+ Mid upper arm        0.27        0.45   branching +-----------------+-------------+----------+---------+ Dist upper arm       0.23        0.20   branching +-----------------+-------------+----------+---------+ Antecubital fossa    0.23        0.17             +-----------------+-------------+----------+---------+ Prox forearm         0.21        0.24   branching +-----------------+-------------+----------+---------+ Mid forearm          0.25        0.27             +-----------------+-------------+----------+---------+ Dist  forearm         0.19        0.32             +-----------------+-------------+----------+---------+ Wrist                0.20        0.29             +-----------------+-------------+----------+---------+ +-----------------+-------------+----------+---------+ Left Cephalic    Diameter (cm)Depth (cm)Findings  +-----------------+-------------+----------+---------+ Shoulder             0.25        1.23             +-----------------+-------------+----------+---------+ Prox upper arm       0.31        0.88             +-----------------+-------------+----------+---------+ Mid upper arm        0.37        0.52             +-----------------+-------------+----------+---------+ Dist upper arm     0.30/0.32  0.49/0.78 branching +-----------------+-------------+----------+---------+ Antecubital fossa    0.25        0.24   branching +-----------------+-------------+----------+---------+ Prox forearm  0.29        0.23             +-----------------+-------------+----------+---------+ Mid forearm        0.26/0.41  0.34/0.26 branching +-----------------+-------------+----------+---------+ Dist forearm         0.30        0.12             +-----------------+-------------+----------+---------+ Wrist                0.33        0.10             +-----------------+-------------+----------+---------+ *See table(s) above for measurements and observations.  Diagnosing physician: Orlie Pollen Electronically signed by Orlie Pollen on 09/20/2021 at 3:02:46 PM.    Final      Medical Consultants:   Nephrology, oncology   Subjective:    [name] has aches and pains all over, denies any nausea or vomiting today, denies shortness of breath  Objective:    Vitals:   09/21/21 1520 09/21/21 2022 09/22/21 0623 09/22/21 0821  BP: 123/72 (!) 97/59 120/67 120/77  Pulse: 81 84  73  Resp:  17 17 17   Temp: 98 F (36.7 C) 98.1 F (36.7 C) 98.2 F (36.8 C) 97.7 F  (36.5 C)  TempSrc: Oral Oral Oral Oral  SpO2:  97% 97% 98%  Weight:   71.7 kg   Height:       SpO2: 98 % O2 Flow Rate (L/min): 2 L/min   Intake/Output Summary (Last 24 hours) at 09/22/2021 1041 Last data filed at 09/21/2021 2300 Gross per 24 hour  Intake --  Output 900 ml  Net -900 ml   Filed Weights   09/21/21 1113 09/21/21 1436 09/22/21 0623  Weight: 70 kg 70 kg 71.7 kg    Exam General exam: Chronically ill elderly male sitting up in bed, AAOx3, nondistressed HEENT: Right IJ HD catheter noted CVS: S1-S2, regular rate rhythm Lungs: Clear bilaterally  Abdomen: Soft, nontender, bowel sounds present Extremities: No edema Skin: No rash on exposed skin  Data Reviewed:    Labs: Basic Metabolic Panel: Recent Labs  Lab 09/17/21 0514 09/18/21 0410 09/20/21 0225 09/21/21 0212 09/22/21 0148  NA 134* 133* 136 135 134*  K 5.1 4.9 4.0 4.0 4.1  CL 100 98 102 103 102  CO2 22 23 25 24 25   GLUCOSE 91 108* 94 89 95  BUN 41* 45* 21 29* 18  CREATININE 5.91* 6.05* 4.09* 4.80* 3.40*  CALCIUM 8.3* 8.6* 8.2* 8.4* 7.8*  PHOS 6.5* 6.3* 4.8* 5.5* 4.0   GFR Estimated Creatinine Clearance: 20.2 mL/min (A) (by C-G formula based on SCr of 3.4 mg/dL (H)). Liver Function Tests: Recent Labs  Lab 09/17/21 0514 09/18/21 0410 09/20/21 0225 09/21/21 0212 09/22/21 0148  ALBUMIN 1.5* 1.6* <1.5* 1.5* 1.5*   No results for input(s): LIPASE, AMYLASE in the last 168 hours. No results for input(s): AMMONIA in the last 168 hours. Coagulation profile No results for input(s): INR, PROTIME in the last 168 hours. COVID-19 Labs  No results for input(s): DDIMER, FERRITIN, LDH, CRP in the last 72 hours.  Lab Results  Component Value Date   [place] NEGATIVE 08/23/2021    CBC: Recent Labs  Lab 09/17/21 0514 09/18/21 0410 09/20/21 0225 09/21/21 0212 09/22/21 0148  WBC 10.0 12.0* 9.2 8.8 9.9  NEUTROABS 6.6 9.7* 5.9 5.4 6.7  HGB 9.3* 9.3* 8.3* 8.3* 8.1*  HCT 29.4* 28.9* 26.9* 27.0*  25.7*  MCV 85.0 83.5  86.2 87.1 86.5  PLT 377 347 254 233 215   Cardiac Enzymes: No results for input(s): CKTOTAL, CKMB, CKMBINDEX, TROPONINI in the last 168 hours. BNP (last 3 results) No results for input(s): PROBNP in the last 8760 hours. CBG: No results for input(s): GLUCAP in the last 168 hours. D-Dimer: No results for input(s): DDIMER in the last 72 hours. Hgb A1c: No results for input(s): HGBA1C in the last 72 hours. Lipid Profile: No results for input(s): CHOL, HDL, LDLCALC, TRIG, CHOLHDL, LDLDIRECT in the last 72 hours. Thyroid function studies: No results for input(s): TSH, T4TOTAL, T3FREE, THYROIDAB in the last 72 hours.  Invalid input(s): FREET3 Anemia work up: Recent Labs    09/21/21 1149  TIBC 167*  IRON 65   Sepsis Labs: Recent Labs  Lab 09/18/21 0410 09/20/21 0225 09/21/21 0212 09/22/21 0148  WBC 12.0* 9.2 8.8 9.9   Microbiology No results found for this or any previous visit (from the past 240 hour(s)).    Medications:    acyclovir  200 mg Oral Q12H   Chlorhexidine Gluconate Cloth  6 each Topical Q0600   feeding supplement (NEPRO CARB STEADY)  237 mL Oral TID BM   folic acid  2 mg Oral Daily   Gerhardt's butt cream   Topical BID   heparin injection (subcutaneous)  5,000 Units Subcutaneous Q8H   melatonin  5 mg Oral QHS   nicotine  14 mg Transdermal Q24H   pantoprazole  40 mg Oral BID AC   tamsulosin  0.4 mg Oral Daily   Continuous Infusions:  sodium chloride 10 mL/hr at 09/17/21 0000   ceFAZolin 2 g (09/18/21 1205)   ferric gluconate (FERRLECIT) IVPB Stopped (09/21/21 1549)   sodium chloride irrigation        LOS: 30 days   [facility]  09/22/2021, 10:41 AM

## 2021-09-22 NOTE — Progress Notes (Signed)
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  She is undergoing dialysis 09/07/2021.  We are dialyzing on a as needed basis.  We are coordinating treatment with his chemotherapy.  Last dialysis was 09/07/2021 with 2.4 L removed.  He is making good urine with 1.9 L    09/15/2021    we discussed dialysis and/or palliative medicine and he wishes to proceed with HD if necessary.  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 MRI suggestive of cardiac involvement   Assessment/ Plan:   #Acute kidney injury, nonoliguric, with history of excessive NSAID's use/BC powders every day.  Peaked creatinine level of 6.95. [name]  He underwent IR guided kidney biopsy on 9/6 complicated by postbiopsy bleeding.  Renal biopsy: AL amyloidosis, lambda light chain composition (glomeruli, interstitium, arteries, and arterioles). Marked IF, 59% global glomerular sclerosis and marked arteriosclerosis.    After extensive conversations regarding goals of care but wants to continue dialysis at this time.  We have discussed that given his complicated medical issues including amyloid involving the heart this will likely continue to be difficult.  Patient has been set up for outpatient dialysis.  He has a tunneled dialysis catheter but not willing to have permanent access placed at this time.  Given his prognosis is likely reasonable.  He is stable to undergo discharge when deemed fit and start dialysis outpatient on Wednesday at [facility].  He may stay untill Monday to get chemotherapy.  Will hopefully hold dialysis on Monday so doesn't have to get chemo and HD. 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  Completed antibiotic course   #Anemia, appears to be relatively stable with a hemoglobin is unchanged - receiving IV iron for iron deficiency. May be reasonable to avoid ESA for now given amyloid unless cleared by hematology   #History of nephrolithiasis: Followed by urology   #Metabolic acidosis: resolved on HD    Subjective:   Patient feels well today without specific complaints.  Tolerated dialysis yesterday   Objective:   BP 120/77   Pulse 73   Temp 97.7 F (36.5 C) (Oral)   Resp 17   Ht 5\' 10"  (1.778 m)   Wt 71.7 kg   SpO2 98%   BMI 22.68 kg/m   Intake/Output Summary (Last 24 hours) at 09/22/2021 1108 Last data filed at 09/21/2021 2300 Gross per 24 hour  Intake --  Output 900 ml  Net -900 ml   Weight change: -0.716 kg  Physical Exam: General: Lying in bed in no distress Heart: Normal rate Lungs: bilateral chest rise with no increased work of breathing Abdomen: Soft, nontender Extremities: No LE edema. Neurology: Alert, awake, following commands, no asterixis Vascular Access: Right IJ TC  Imaging: VAS [place] UPPER EXT VEIN MAPPING (PRE-OP AVF)  Result Date: 09/20/2021 [facility] Patient Name:  Caleb Simmons  Date of Exam:   09/20/2021 Medical Rec #: [id number]     Accession #:    [id number]  Date of Birth: 03-03-1951      Patient Gender: M Patient Age:   23 years Exam Location:  [facility] Procedure:      VAS [place] UPPER EXT VEIN MAPPING (PRE-OP AVF) Referring Phys: Caleb Simmons --------------------------------------------------------------------------------  Indications: Pre-access. Comparison Study: No prior studies. Performing Technologist: Caleb Simmons RDMS, RVT  Examination Guidelines: A complete evaluation includes B-mode imaging,  spectral Doppler, color Doppler, and power Doppler as needed of all accessible portions of each vessel. Bilateral testing is considered an integral part of a complete examination. Limited examinations for reoccurring indications may be performed as noted. +-----------------+-------------+----------+---------+ Right Cephalic   Diameter (cm)Depth (cm)Findings  +-----------------+-------------+----------+---------+ Shoulder             0.33        0.97             +-----------------+-------------+----------+---------+ Prox upper arm       0.36        0.75             +-----------------+-------------+----------+---------+ Mid upper arm        0.27        0.45   branching +-----------------+-------------+----------+---------+ Dist upper arm       0.23        0.20   branching +-----------------+-------------+----------+---------+ Antecubital fossa    0.23        0.17             +-----------------+-------------+----------+---------+ Prox forearm         0.21        0.24   branching +-----------------+-------------+----------+---------+ Mid forearm          0.25        0.27             +-----------------+-------------+----------+---------+ Dist forearm         0.19        0.32             +-----------------+-------------+----------+---------+ Wrist                0.20        0.29             +-----------------+-------------+----------+---------+ +-----------------+-------------+----------+---------+ Left Cephalic    Diameter (cm)Depth (cm)Findings  +-----------------+-------------+----------+---------+ Shoulder             0.25        1.23             +-----------------+-------------+----------+---------+ Prox upper arm       0.31        0.88             +-----------------+-------------+----------+---------+ Mid upper arm        0.37        0.52             +-----------------+-------------+----------+---------+ Dist upper arm     0.30/0.32  0.49/0.78  branching +-----------------+-------------+----------+---------+ Antecubital fossa    0.25        0.24   branching +-----------------+-------------+----------+---------+ Prox forearm         0.29        0.23             +-----------------+-------------+----------+---------+ Mid forearm        0.26/0.41  0.34/0.26 branching +-----------------+-------------+----------+---------+ Dist forearm         0.30        0.12             +-----------------+-------------+----------+---------+ Wrist  0.33        0.10             +-----------------+-------------+----------+---------+ *See table(s) above for measurements and observations.  Diagnosing physician: Orlie Pollen Electronically signed by Orlie Pollen on 09/20/2021 at 3:02:46 PM.    Final     Labs: BMET Recent Labs  Lab 09/16/21 0139 09/16/21 1601 09/17/21 0932 09/18/21 0410 09/20/21 0225 09/21/21 0212 09/22/21 0148  NA 135 133* 134* 133* 136 135 134*  K 5.8* 5.3* 5.1 4.9 4.0 4.0 4.1  CL 101 101 100 98 102 103 102  CO2 21* 23 22 23 25 24 25   GLUCOSE 91 83 91 108* 94 89 95  BUN 42* 39* 41* 45* 21 29* 18  CREATININE 5.48* 5.69* 5.91* 6.05* 4.09* 4.80* 3.40*  CALCIUM 8.2* 8.6* 8.3* 8.6* 8.2* 8.4* 7.8*  PHOS 5.9*  --  6.5* 6.3* 4.8* 5.5* 4.0   CBC Recent Labs  Lab 09/18/21 0410 09/20/21 0225 09/21/21 0212 09/22/21 0148  WBC 12.0* 9.2 8.8 9.9  NEUTROABS 9.7* 5.9 5.4 6.7  HGB 9.3* 8.3* 8.3* 8.1*  HCT 28.9* 26.9* 27.0* 25.7*  MCV 83.5 86.2 87.1 86.5  PLT 347 254 233 215    Medications:     acyclovir  200 mg Oral Q12H   Chlorhexidine Gluconate Cloth  6 each Topical Q0600   feeding supplement (NEPRO CARB STEADY)  237 mL Oral TID BM   folic acid  2 mg Oral Daily   Gerhardt's butt cream   Topical BID   heparin injection (subcutaneous)  5,000 Units Subcutaneous Q8H   melatonin  5 mg Oral QHS   nicotine  14 mg Transdermal Q24H   pantoprazole  40 mg Oral BID AC   tamsulosin  0.4 mg Oral Daily       Reesa Chew  09/22/2021, 11:08 AM

## 2021-09-23 LAB — CBC WITH DIFFERENTIAL/PLATELET
Abs Immature Granulocytes: 0.03 10*3/uL (ref 0.00–0.07)
Basophils Absolute: 0.1 10*3/uL (ref 0.0–0.1)
Basophils Relative: 2 %
Eosinophils Absolute: 0.2 10*3/uL (ref 0.0–0.5)
Eosinophils Relative: 3 %
HCT: 28 % — ABNORMAL LOW (ref 39.0–52.0)
Hemoglobin: 8.5 g/dL — ABNORMAL LOW (ref 13.0–17.0)
Immature Granulocytes: 0 %
Lymphocytes Relative: 26 %
Lymphs Abs: 2.2 10*3/uL (ref 0.7–4.0)
MCH: 26.5 pg (ref 26.0–34.0)
MCHC: 30.4 g/dL (ref 30.0–36.0)
MCV: 87.2 fL (ref 80.0–100.0)
Monocytes Absolute: 0.6 10*3/uL (ref 0.1–1.0)
Monocytes Relative: 7 %
Neutro Abs: 5.1 10*3/uL (ref 1.7–7.7)
Neutrophils Relative %: 62 %
Platelets: 201 10*3/uL (ref 150–400)
RBC: 3.21 MIL/uL — ABNORMAL LOW (ref 4.22–5.81)
RDW: 18.1 % — ABNORMAL HIGH (ref 11.5–15.5)
WBC: 8.3 10*3/uL (ref 4.0–10.5)
nRBC: 0 % (ref 0.0–0.2)

## 2021-09-23 LAB — RENAL FUNCTION PANEL
Albumin: 1.6 g/dL — ABNORMAL LOW (ref 3.5–5.0)
Anion gap: 9 (ref 5–15)
BUN: 25 mg/dL — ABNORMAL HIGH (ref 8–23)
CO2: 23 mmol/L (ref 22–32)
Calcium: 8.5 mg/dL — ABNORMAL LOW (ref 8.9–10.3)
Chloride: 105 mmol/L (ref 98–111)
Creatinine, Ser: 4.29 mg/dL — ABNORMAL HIGH (ref 0.61–1.24)
GFR, Estimated: 14 mL/min — ABNORMAL LOW (ref >60)
Glucose, Bld: 85 mg/dL (ref 70–99)
Phosphorus: 5 mg/dL — ABNORMAL HIGH (ref 2.5–4.6)
Potassium: 4.2 mmol/L (ref 3.5–5.1)
Sodium: 137 mmol/L (ref 135–145)

## 2021-09-23 LAB — MAGNESIUM: Magnesium: 2.1 mg/dL (ref 1.7–2.4)

## 2021-09-23 NOTE — Plan of Care (Signed)
  Problem: Coping: Goal: Level of anxiety will decrease Outcome: Not Progressing   

## 2021-09-23 NOTE — Progress Notes (Signed)
[facility] PROGRESS NOTE    Progress Note  Caleb Simmons  MVE:[id number] DOB: 1951-10-01 DOA: 08/23/2021 PCP: Pcp, No    Brief Narrative:   Caleb Simmons is an 70 y.o. male with no significant medical history, but he does not see a primary care doctor regularly presented to the ED with weakness, noted to have acute kidney injury and severe normocytic anemia . 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  Oncology was consulted, started on chemotherapy 9/20.  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 09/10/2021 cMRI showed possible cardiac amyloid  Assessment/Plan:   AL amyloidosis/plasma cell myeloma: Acute kidney injury -His creatinine worsened throughout this admission, despite good urine output -Amyloidosis diagnosed on renal biopsy, oncology consulted and following, bone marrow biopsy noted plasma cell myeloma, started chemo CyBorD on 9/20, second dose on 9/26, next due on Monday 10/3 -Nephrology following, previously had HD intermittently and then restarted HD 9/28, has a tunneled dialysis catheter now -Overall prognosis felt to be poor considering cardiac involvement as well, seen by palliative care, patient wishes to pursue full aggressive medical treatment at  this time -Potassium stable, improved on daily Lokelma -On acyclovir for prophylaxis -CLIPPED to [facility] Monday Wednesday Friday -Discharge planning, anticipate discharge home after HD and chemo on Monday, to start outpatient hemodialysis at [facility] on Wednesday 10/5  Sepsis  Group B strep bacteremia: -Repeat blood cultures are negative, TTE without vegetation, bacteremia cleared quickly, followed by infectious disease, recommended 10 days of IV penicillin, this was completed on 9/25  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, with a small pericardial effusion: -Followed by cardiology, cardiac amyloidosis diagnosed on MRI, EF is low at 38% -Cardiac involvement is a poor prognostic sign with rapid progression, seen by palliative care as well, patient was wishes to proceed with aggressive medical treatment -Cardiology recommended to continue chemotherapy, they will arrange follow-up -Volume managed with HD  Chronic anemia -Multifactorial, from CKD and plasma cell myeloma -Anemia panel on 9/1 with iron deficiency -Given IV iron, consider EPO with HD  Gross hematuria/acute urinary retention due to bleeding in the collecting duct of the right kidney post renal biopsy: Foley was discontinued 08/30/2021. He required multiple transfusions. Status post embolization on 08/30/2021. -resolved  Anxiety: Continue Xanax, continue trazodone. He is not interested in other medications like SSRI.  Tobacco abuse: Continue nicotine patch.   DVT prophylaxis: Hep SQ Full code Family Communication:no family at bedside, discussed patient in detail Status is: Inpatient  Remains inpatient appropriate because:Hemodynamically unstable  Dispo: The patient is from: Home              Anticipated d/c is to: Home likley Monday after HD and chemo              Patient currently is not  medically stable to d/c.   Difficult to place patient No   Medical Consultants:    Nephrology, oncology   Subjective:    Caleb Simmons complains of aches and pains in his neck and back, otherwise no other issues  Objective:    Vitals:   09/22/21 0821 09/22/21 1446 09/22/21 1947 09/23/21 0439  BP: 120/77 (!) 114/55 115/69 116/72  Pulse: 73 79 67   Resp: 17   17  Temp: 97.7 F (36.5 C) 98.4 F (36.9 C) 98.5 F (36.9 C) 98.1 F (36.7 C)  TempSrc: Oral Axillary Oral Oral  SpO2: 98% 100% 100%   Weight:      Height:       SpO2: 100 % O2 Flow Rate (L/min): 2 L/min   Intake/Output Summary (Last 24 hours) at 09/23/2021 1108 Last data filed at 09/23/2021 1005 Gross per 24 hour  Intake 1345.82 ml  Output 2275 ml  Net -929.18 ml   Filed Weights   09/21/21 1113 09/21/21 1436 09/22/21 0623  Weight: 70 kg 70 kg 71.7 kg    Exam General exam: Chronically ill elderly male sitting up in bed, AAOx3, no distress HEENT: Right IJ HD catheter noted CVS: S1-S2, regular rate rhythm Lungs: Clear bilaterally Abdomen: Soft, nontender, bowel sounds present  Extremities: No edema Skin: No rash on exposed skin  Data Reviewed:    Labs: Basic Metabolic Panel: Recent Labs  Lab 09/18/21 0410 09/20/21 0225 09/21/21 0212 09/22/21 0148 09/23/21 0224  NA 133* 136 135 134* 137  K 4.9 4.0 4.0 4.1 4.2  CL 98 102 103 102 105  CO2 _0 GLUCOSE 108* 94 89 95 85  BUN 45* 21 29* 18 25*  CREATININE 6.05* 4.09* 4.80* 3.40* 4.29*  CALCIUM 8.6* 8.2* 8.4* 7.8* 8.5*  PHOS 6.3* 4.8* 5.5* 4.0 5.0*   GFR Estimated Creatinine Clearance: 16 mL/min (A) (by C-G formula based on SCr of 4.29 mg/dL (H)). Liver Function Tests: Recent Labs  Lab 09/18/21 0410 09/20/21 0225 09/21/21 0212 09/22/21 0148 09/23/21 0224  ALBUMIN 1.6* <1.5* 1.5* 1.5* 1.6*   No results for input(s): LIPASE, AMYLASE in the last 168 hours. No results for input(s): AMMONIA in the last 168 hours. Coagulation profile No results for input(s): INR, PROTIME in the last 168 hours. COVID-19  Labs  No results for input(s): DDIMER, FERRITIN, LDH, CRP in the last 72 hours.  Lab Results  Component Value Date   [place] NEGATIVE 08/23/2021    CBC: Recent Labs  Lab 09/18/21 0410 09/20/21 0225 09/21/21 0212 09/22/21 0148 09/23/21 0224  WBC 12.0* 9.2 8.8 9.9 8.3  NEUTROABS 9.7* 5.9 5.4 6.7 5.1  HGB 9.3* 8.3* 8.3* 8.1* 8.5*  HCT 28.9* 26.9* 27.0* 25.7* 28.0*  MCV 83.5 86.2 87.1 86.5 87.2  PLT 347 254 233 215 201   Cardiac Enzymes: No results for input(s): CKTOTAL, CKMB, CKMBINDEX, TROPONINI in the last 168 hours. BNP (last 3 results) No results for input(s): PROBNP in the last 8760 hours. CBG: No results for input(s): GLUCAP in the last 168 hours. D-Dimer: No results for input(s): DDIMER in the last 72 hours. Hgb A1c: No results for input(s): HGBA1C in the last 72 hours. Lipid Profile: No results for input(s): CHOL, HDL, LDLCALC, TRIG, CHOLHDL, LDLDIRECT in the last 72 hours. Thyroid function studies: No results for input(s): TSH, T4TOTAL, T3FREE, THYROIDAB in the last 72 hours.  Invalid input(s): FREET3 Anemia work up: Recent Labs    09/21/21 1149  TIBC 167*  IRON 65   Sepsis Labs: Recent Labs  Lab 09/20/21 0225 09/21/21 0212 09/22/21 0148 09/23/21 0224  WBC 9.2 8.8 9.9 8.3   Microbiology No results found for this or any previous visit (from the past 240 hour(s)).    Medications:    acyclovir  200 mg Oral Q12H   Chlorhexidine Gluconate Cloth  6 each Topical Q0600   feeding supplement (NEPRO CARB STEADY)  237 mL Oral TID BM   folic acid  2 mg Oral Daily   Gerhardt's butt cream   Topical BID   heparin injection (subcutaneous)  5,000 Units Subcutaneous Q8H   melatonin  5 mg Oral QHS   nicotine  14 mg Transdermal Q24H   pantoprazole  40 mg Oral BID AC   tamsulosin  0.4 mg Oral Daily   Continuous Infusions:  sodium chloride 10 mL/hr at 09/17/21 0000   ceFAZolin 2 g (09/18/21 1205)   sodium chloride irrigation        LOS: 31 days    [facility]  09/23/2021, 11:08 AM

## 2021-09-23 NOTE — Progress Notes (Signed)
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  She is undergoing dialysis 09/07/2021.  We are dialyzing on a as needed basis.  We are coordinating treatment with his chemotherapy.  Last dialysis was 09/07/2021 with 2.4 L removed.  He is making good urine with 1.9 L    09/15/2021    we discussed dialysis and/or palliative medicine and he wishes to proceed with HD if necessary.  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 MRI suggestive of cardiac involvement   Assessment/ Plan:   #Acute kidney injury, nonoliguric, with history of excessive NSAID's use/BC powders every day.  Peaked creatinine level of 6.95. [name]  He underwent IR guided kidney biopsy on 9/6 complicated by postbiopsy bleeding.  Renal biopsy: AL amyloidosis, lambda light chain composition (glomeruli, interstitium, arteries, and arterioles). Marked IF, 59% global glomerular sclerosis and marked arteriosclerosis.    After extensive conversations regarding goals of care but wants to continue dialysis at this time.  We have discussed that given his complicated medical issues including amyloid involving the heart this will likely continue to be difficult.  Patient has been set up for outpatient dialysis.  He has a tunneled dialysis catheter but not willing to have permanent access placed at this time.  Given his prognosis is likely reasonable.  He is stable to undergo discharge when deemed fit and start dialysis outpatient on Wednesday at [facility].  He is receiving chemotherapy tomorrow and likely DC after. Plan to hold dialysis on Monday so doesn't have to get chemo and HD. 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  Completed antibiotic course   #Anemia, appears to be relatively stable with a hemoglobin is unchanged - receiving IV iron for iron deficiency. May be reasonable to avoid ESA for now given amyloid unless cleared by hematology   #History of nephrolithiasis: Followed by urology   #Metabolic acidosis: resolved on HD    Subjective:   Patient feels well no complaints   Objective:   BP 116/72   Pulse 67   Temp 98.1 F (36.7 C) (Oral)   Resp 17   Ht 5\' 10"  (1.778 m)   Wt 71.7 kg   SpO2 100%   BMI 22.68 kg/m   Intake/Output Summary (Last 24 hours) at 09/23/2021 2979 Last data filed at 09/23/2021 0400 Gross per 24 hour  Intake 1345.82 ml  Output 1650 ml  Net -304.18 ml   Weight change:   Physical Exam: General: Lying in bed in no distress Heart: Normal rate Lungs: bilateral chest rise with no increased work of breathing Abdomen: Soft, nontender Extremities: No LE edema. Neurology: Alert, awake, following commands, no asterixis Vascular Access: Right IJ TC  Imaging: No results found.  Labs: BMET Recent Labs  Lab 09/17/21 0514 09/18/21 0410 09/20/21 0225 09/21/21 0212 09/22/21 0148 09/23/21 0224  NA 134* 133* 136 135 134* 137  K 5.1 4.9 4.0 4.0 4.1 4.2  CL 100 98 102 103 102 105  CO2 22  23 25 24 25 23   GLUCOSE 91 108* 94 89 95 85  BUN 41* 45* 21 29* 18 25*  CREATININE 5.91* 6.05* 4.09* 4.80* 3.40* 4.29*  CALCIUM 8.3* 8.6* 8.2* 8.4* 7.8* 8.5*  PHOS 6.5* 6.3* 4.8* 5.5* 4.0 5.0*   CBC Recent Labs  Lab 09/20/21 0225 09/21/21 0212 09/22/21 0148 09/23/21 0224  WBC 9.2 8.8 9.9 8.3  NEUTROABS 5.9 5.4 6.7 5.1  HGB 8.3* 8.3* 8.1* 8.5*  HCT 26.9* 27.0* 25.7* 28.0*  MCV 86.2 87.1 86.5 87.2  PLT 254 233 215 201    Medications:     acyclovir  200  mg Oral Q12H   Chlorhexidine Gluconate Cloth  6 each Topical Q0600   feeding supplement (NEPRO CARB STEADY)  237 mL Oral TID BM   folic acid  2 mg Oral Daily   Gerhardt's butt cream   Topical BID   heparin injection (subcutaneous)  5,000 Units Subcutaneous Q8H   melatonin  5 mg Oral QHS   nicotine  14 mg Transdermal Q24H   pantoprazole  40 mg Oral BID AC   tamsulosin  0.4 mg Oral Daily      Caleb Simmons  09/23/2021, 9:21 AM

## 2021-09-23 NOTE — Progress Notes (Signed)
Pt had 6 beat run of Vtach at 1557. See CCMD save strip. Provider Fanny Bien notified by page at [phone].   [phone] Provider response: Likely due to cardiac amyloidosis, will order a Magnesium level.

## 2021-09-24 ENCOUNTER — Other Ambulatory Visit: Payer: Self-pay | Admitting: Hematology

## 2021-09-24 ENCOUNTER — Inpatient Hospital Stay: Payer: Commercial Managed Care - PPO

## 2021-09-24 DIAGNOSIS — E854 Organ-limited amyloidosis: Secondary | ICD-10-CM

## 2021-09-24 DIAGNOSIS — E8581 Light chain (AL) amyloidosis: Secondary | ICD-10-CM | POA: Diagnosis not present

## 2021-09-24 LAB — CBC WITH DIFFERENTIAL/PLATELET
Abs Immature Granulocytes: 0.04 10*3/uL (ref 0.00–0.07)
Basophils Absolute: 0.1 10*3/uL (ref 0.0–0.1)
Basophils Relative: 1 %
Eosinophils Absolute: 0.2 10*3/uL (ref 0.0–0.5)
Eosinophils Relative: 2 %
HCT: 26.8 % — ABNORMAL LOW (ref 39.0–52.0)
Hemoglobin: 8.2 g/dL — ABNORMAL LOW (ref 13.0–17.0)
Immature Granulocytes: 1 %
Lymphocytes Relative: 24 %
Lymphs Abs: 2.1 10*3/uL (ref 0.7–4.0)
MCH: 26.9 pg (ref 26.0–34.0)
MCHC: 30.6 g/dL (ref 30.0–36.0)
MCV: 87.9 fL (ref 80.0–100.0)
Monocytes Absolute: 0.5 10*3/uL (ref 0.1–1.0)
Monocytes Relative: 6 %
Neutro Abs: 5.7 10*3/uL (ref 1.7–7.7)
Neutrophils Relative %: 66 %
Platelets: 196 10*3/uL (ref 150–400)
RBC: 3.05 MIL/uL — ABNORMAL LOW (ref 4.22–5.81)
RDW: 18 % — ABNORMAL HIGH (ref 11.5–15.5)
WBC: 8.6 10*3/uL (ref 4.0–10.5)
nRBC: 0 % (ref 0.0–0.2)

## 2021-09-24 LAB — RENAL FUNCTION PANEL
Albumin: 1.6 g/dL — ABNORMAL LOW (ref 3.5–5.0)
Anion gap: 8 (ref 5–15)
BUN: 29 mg/dL — ABNORMAL HIGH (ref 8–23)
CO2: 22 mmol/L (ref 22–32)
Calcium: 8.6 mg/dL — ABNORMAL LOW (ref 8.9–10.3)
Chloride: 104 mmol/L (ref 98–111)
Creatinine, Ser: 4.83 mg/dL — ABNORMAL HIGH (ref 0.61–1.24)
GFR, Estimated: 12 mL/min — ABNORMAL LOW (ref >60)
Glucose, Bld: 94 mg/dL (ref 70–99)
Phosphorus: 5.2 mg/dL — ABNORMAL HIGH (ref 2.5–4.6)
Potassium: 4.1 mmol/L (ref 3.5–5.1)
Sodium: 134 mmol/L — ABNORMAL LOW (ref 135–145)

## 2021-09-24 MED ORDER — BORTEZOMIB CHEMO SQ INJECTION 3.5 MG (2.5MG/ML): 1.3000 mg/m2 | Freq: Once | INTRAMUSCULAR | Status: DC

## 2021-09-24 MED ORDER — FOLIC ACID 1 MG PO TABS: 2.0000 mg | ORAL_TABLET | Freq: Every day | ORAL | 0 refills | Status: DC

## 2021-09-24 MED ORDER — CYCLOPHOSPHAMIDE 25 MG PO CAPS
225.0000 mg/m2 | ORAL_CAPSULE | Freq: Every day | ORAL | Status: AC
  Administered 2021-09-24: 425 mg via ORAL
  Filled 2021-09-24: qty 17
  Filled 2021-09-24: qty 9

## 2021-09-24 MED ORDER — ACETAMINOPHEN 325 MG PO TABS: 650.0000 mg | ORAL_TABLET | Freq: Four times a day (QID) | ORAL | Status: DC | PRN

## 2021-09-24 MED ORDER — TAMSULOSIN HCL 0.4 MG PO CAPS: 0.4000 mg | ORAL_CAPSULE | Freq: Every day | ORAL | 0 refills | Status: DC

## 2021-09-24 MED ORDER — ONDANSETRON HCL 4 MG PO TABS: 4.0000 mg | ORAL_TABLET | Freq: Four times a day (QID) | ORAL | 0 refills | Status: DC | PRN

## 2021-09-24 MED ORDER — PANTOPRAZOLE SODIUM 40 MG PO TBEC: 40.0000 mg | DELAYED_RELEASE_TABLET | Freq: Two times a day (BID) | ORAL | 0 refills | Status: DC

## 2021-09-24 MED ORDER — DEXAMETHASONE 4 MG PO TABS: 20.0000 mg | ORAL_TABLET | Freq: Once | ORAL | Status: DC

## 2021-09-24 MED ORDER — PROCHLORPERAZINE MALEATE 5 MG PO TABS: 5.0000 mg | ORAL_TABLET | Freq: Four times a day (QID) | ORAL | 0 refills | Status: DC | PRN

## 2021-09-24 MED ORDER — DEXAMETHASONE 6 MG PO TABS
20.0000 mg | ORAL_TABLET | Freq: Once | ORAL | Status: AC
  Administered 2021-09-24: 20 mg via ORAL
  Filled 2021-09-24: qty 1

## 2021-09-24 MED ORDER — OXYCODONE HCL 5 MG PO TABS: 5.0000 mg | ORAL_TABLET | Freq: Four times a day (QID) | ORAL | 0 refills | Status: DC | PRN

## 2021-09-24 MED ORDER — BORTEZOMIB CHEMO SQ INJECTION 3.5 MG (2.5MG/ML)
1.3000 mg/m2 | Freq: Once | INTRAMUSCULAR | Status: AC
  Administered 2021-09-24: 2.5 mg via SUBCUTANEOUS
  Filled 2021-09-24: qty 1

## 2021-09-24 NOTE — Progress Notes (Signed)
Pt for d/c today. Spoke to pt's son via phone. Son aware that pt needs to be at [facility] on Wednesday at 11:45 to complete paperwork prior to 12:40 chair time. Contacted [place] to advise them of pt's d/c today and pt to start Wednesday. [facility] PA aware of pt's d/c and to complete orders and send to clinic.   Melven Sartorius Renal Navigator [phone]

## 2021-09-24 NOTE — Progress Notes (Addendum)
HEMATOLOGY-ONCOLOGY PROGRESS NOTE  SUBJECTIVE: Mr. Cueto reports that he feels well today. No nausea or vomiting.  He is scheduled for chemotherapy this afternoon and then for discharge to home.  REVIEW OF SYSTEMS:   Constitutional: Denies fevers, chills  Eyes: Denies blurriness of vision Ears, nose, mouth, throat, and face: Denies mucositis or sore throat Respiratory: Denies cough, dyspnea or wheezes Cardiovascular: Denies palpitation, chest discomfort Gastrointestinal: Denies nausea and vomiting Skin: Denies abnormal skin rashes Lymphatics: Denies new lymphadenopathy or easy bruising Neurological:Denies numbness, tingling or new weaknesses Behavioral/Psych: Mood is stable, no new changes  Extremities: No lower extremity edema All other systems were reviewed with the patient and are negative.  I have reviewed the past medical history, past surgical history, social history and family history with the patient and they are unchanged from previous note.   PHYSICAL EXAMINATION: ECOG PERFORMANCE STATUS: 2 - Symptomatic, <50% confined to bed  Vitals:   09/23/21 2041 09/24/21 0515  BP: 112/65 135/82  Pulse: 87 (!) 104  Resp: 20 15  Temp: 98.3 F (36.8 C) 97.8 F (36.6 C)  SpO2: 98% 100%   Filed Weights   09/21/21 1436 09/22/21 0623 09/24/21 0515  Weight: 70 kg 71.7 kg 70.6 kg    Intake/Output from previous day: 10/02 0701 - 10/03 0700 In: -  Out: 1975 [Urine:1975]  GENERAL:alert, no distress and comfortable, dialysis catheter in the right subclavicular  SKIN: skin color, texture, turgor are normal, no rashes or significant lesions EYES: normal, Conjunctiva are pink and non-injected, sclera clear OROPHARYNX:no exudate, no erythema and lips, buccal mucosa, and tongue normal  LUNGS: clear to auscultation and percussion with normal breathing effort HEART: regular rate & rhythm and no murmurs and no lower extremity edema ABDOMEN:abdomen soft, non-tender and normal bowel  sounds NEURO: alert & oriented x 3 with fluent speech, no focal motor/sensory deficits  LABORATORY DATA:  I have reviewed the data as listed CMP Latest Ref Rng & Units 09/24/2021 09/23/2021 09/22/2021  Glucose 70 - 99 mg/dL 94 85 95  BUN 8 - 23 mg/dL 29(H) 25(H) 18  Creatinine 0.61 - 1.24 mg/dL 4.83(H) 4.29(H) 3.40(H)  Sodium 135 - 145 mmol/L 134(L) 137 134(L)  Potassium 3.5 - 5.1 mmol/L 4.1 4.2 4.1  Chloride 98 - 111 mmol/L 104 105 102  CO2 22 - 32 mmol/L _0 Calcium 8.9 - 10.3 mg/dL 8.6(L) 8.5(L) 7.8(L)  Total Protein 6.5 - 8.1 g/dL - - -  Total Bilirubin 0.3 - 1.2 mg/dL - - -  Alkaline Phos 38 - 126 U/L - - -  AST 15 - 41 U/L - - -  ALT 0 - 44 U/L - - -    Lab Results  Component Value Date   WBC 8.6 09/24/2021   HGB 8.2 (L) 09/24/2021   HCT 26.8 (L) 09/24/2021   MCV 87.9 09/24/2021   PLT 196 09/24/2021   NEUTROABS 5.7 09/24/2021    CT ABDOMEN PELVIS WO CONTRAST  Result Date: 08/30/2021 CLINICAL DATA:  Retroperitoneal hematoma, follow up s/p random renal bx with perinephric hematoma development EXAM: CT ABDOMEN AND PELVIS WITHOUT CONTRAST TECHNIQUE: Multidetector CT imaging of the abdomen and pelvis was performed following the standard protocol without IV contrast. COMPARISON:  September 6 FINDINGS: Inferior chest: Trace bilateral pleural effusions with right greater than left compressive subsegmental atelectasis. Hepatobiliary: The liver is normal in size without focal abnormality. No intrahepatic or extrahepatic biliary ductal dilation. The gallbladder appears normal. Spleen: Normal in size without focal abnormality. Pancreas:  No pancreatic ductal dilatation or surrounding inflammatory changes. Adrenals/Urinary Tract: Adrenal glands are unremarkable. The kidneys are normal in size. Tiny perinephric hematoma along the right renal lower pole is unchanged. Hyperdense material in the right collecting system and bladder consistent with blood products, grossly similar. Stomach/Bowel:  The stomach, small bowel and large bowel are normal in caliber without abnormal wall thickening or surrounding inflammatory changes. Reproductive: Prostate is unremarkable. Lymphatic: No enlarged lymph nodes in the abdomen or pelvis. Vasculature: The abdominal aorta is normal in caliber. Aortic atherosclerosis. Other: No abdominopelvic ascites. Musculoskeletal: No aggressive osseous lesions. Degenerative changes at L5-S1. Bone island in the left ilium. IMPRESSION: The small perinephric hematoma along the right renal lower pole is stable, and within expected limits after percutaneous biopsy. However, there remains substantial clot burden within the bladder. Follow-up urology recommendations for management. Electronically Signed   By: Albin Felling M.D.   On: 08/30/2021 14:21   CT ABDOMEN PELVIS WO CONTRAST  Result Date: 08/29/2021 CLINICAL DATA:  Post right-sided renal biopsy, now with hematuria and hypotension. EXAM: CT ABDOMEN AND PELVIS WITHOUT CONTRAST TECHNIQUE: Multidetector CT imaging of the abdomen and pelvis was performed following the standard protocol without IV contrast. COMPARISON:  CT abdomen pelvis-08/23/2021; ultrasound-guided right renal biopsy-earlier same day FINDINGS: The lack of intravenous contrast limits the ability to evaluate solid abdominal organs. Lower chest: Limited visualization of the lower thorax demonstrates interval development of trace bilateral effusion with worsening bibasilar heterogeneous/consolidative opacities, right greater than left. Previously identified nonspecific ground-glass opacities within the imaged lung bases is not seen on the present examination though there is mild residual intraseptal thickening. Normal heart size. Trace amount of pericardial fluid, unchanged presumably physiologic. There is diffuse decreased attenuation intra cardiac blood pool suggestive of anemia. Hepatobiliary: Normal hepatic contour. Apparent high density material within the gallbladder  could represent biliary sludge. No definitive gallbladder wall thickening or pericholecystic stranding on this noncontrast examination. No ascites. Pancreas: Normal noncontrast appearance of the pancreas. Spleen: Normal noncontrast appearance of the spleen. Adrenals/Urinary Tract: There is a very tiny (approximately 2.7 x 1.6 x 1.2 cm) perinephric hematoma about the inferior pole of the right kidney (axial image 44, series 3; coronal image 57, series 6), with minimal amount of adjacent perinephric stranding. High-density material is seen within the right renal collecting system and ureter with moderate to large amount of layering high-density material within urinary bladder, findings compatible with hemorrhage into the collecting system and bladder. Mild associated right-sided pelviectasis and ureterectasis. Normal noncontrast appearance of the left kidney. No evidence of left-sided nephrolithiasis or urinary obstruction. Normal noncontrast appearance of the bilateral adrenal glands. Stomach/Bowel: Scattered minimal colonic diverticulosis without evidence of superimposed acute diverticulitis on this noncontrast examination. Normal appearance of the terminal ileum. The appendix is not visualized compatible with provided operative history. No discrete areas of bowel wall thickening on this noncontrast examination. No pneumoperitoneum, pneumatosis or portal venous gas. Vascular/Lymphatic: Moderate amount of atherosclerotic plaque within normal caliber abdominal aorta. Scattered retroperitoneal lymph nodes are numerous though individually not enlarged by size criteria with index left sided periaortic lymph node measuring 0.7 cm in greatest short axis diameter (image 31, series 3), presumably reactive in etiology. No bulky retroperitoneal, mesenteric, pelvic or inguinal lymphadenopathy on this noncontrast examination Reproductive: Dystrophic calcifications within normal sized prostate gland. Trace amount of fluid within  the pelvic cul-de-sac. Other: Small bilateral mesenteric fat containing inguinal hernias, left greater than right. Minimal amount of subcutaneous edema about the midline of the low back. Presumed  shrapnel is seen within the right lower abdominal/pelvic ventral abdominal wall. Musculoskeletal: No acute or aggressive osseous abnormalities. Mild-to-moderate multilevel lumbar spine DDD, worse at L4-L5 and L5-S1 with disc space height loss, endplate irregularity and small posteriorly directed disc osteophyte complexes at these locations. Mild degenerative change the bilateral hips with joint space loss, subchondral sclerosis and osteophytosis, right greater than left. IMPRESSION: 1. Post right-sided renal biopsy complicated by tiny (approximately 2.7 cm) perinephric hematoma and bleeding into the right renal collecting system including moderate to large-sized clot within the urinary bladder and associated mild right-sided pelviectasis and ureterectasis. Consideration for initiation of continuous bladder irrigation could be performed as indicated. 2. Trace bilateral effusions with associated bibasilar opacities, right greater than left, likely atelectasis. 3. Colonic diverticulosis without evidence superimposed acute diverticulitis. 4.  Aortic Atherosclerosis (ICD10-I70.0). Critical Value/emergent results were called by telephone at the time of interpretation on 08/28/2021 at 5:04 pm to provider [facility] , who verbally acknowledged these results. Electronically Signed   By: Sandi Mariscal M.D.   On: 08/29/2021 10:38   DG Chest 2 View  Result Date: 09/06/2021 CLINICAL DATA:  Fever EXAM: CHEST - 2 VIEW COMPARISON:  09/03/2021 FINDINGS: Right IJ dialysis catheter tip med right atrium. Midline trachea. Normal heart size. Left costophrenic angle minimally excluded the frontal radiograph. Small bilateral pleural effusions. No pneumothorax. Improved interstitial edema with minimal pulmonary venous congestion remaining.  Persistent mild bibasilar atelectasis. IMPRESSION: Improved interstitial edema with mild pulmonary venous congestion remaining. Small bilateral pleural effusions with adjacent atelectasis. Electronically Signed   By: Abigail Miyamoto M.D.   On: 09/06/2021 13:49   IR Angiogram Renal Left Selective  INDICATION: 70 year old male with history of acute kidney injury of uncertain etiology status post ultrasound-guided right renal biopsy on 08/28/2021. Since biopsy, the patient has experienced gross hematuria with associated acute anemia.   EXAM: 1. Ultrasound-guided vascular access of the right internal jugular vein. 2. Temporary hemodialysis catheter placement. 3. Ultrasound-guided vascular access of the right common femoral artery. 4. Selective catheterization and angiography of the right renal artery. 5. Sub selective catheterization angiography of right inferior polar and arcuate artery branches. 6. Coil embolization of right inferior pole arcuate artery branch.   MEDICATIONS: None.   ANESTHESIA/SEDATION: Moderate (conscious) sedation was employed during this procedure. A total of Versed 3 mg and Fentanyl 50 mcg was administered intravenously.   Moderate Sedation Time: 66 minutes. The patient's level of consciousness and vital signs were monitored continuously by radiology nursing throughout the procedure under my direct supervision.   CONTRAST:  80m OMNIPAQUE IOHEXOL 350 MG/ML SOLN, 524mOMNIPAQUE IOHEXOL 350 MG/ML SOLN   FLUOROSCOPY TIME:  Fluoroscopy Time: 10.6 minutes, (811 mGy).   COMPLICATIONS: None immediate.   PROCEDURE: Informed consent was obtained from the patient following explanation of the procedure, risks, benefits and alternatives. The patient understands, agrees and consents for the procedure. All questions were addressed. A time out was performed prior to the initiation of the procedure. Maximal barrier sterile technique utilized including caps, mask, sterile gowns, sterile gloves, large sterile  drape, hand hygiene, and chlorhexidine prep.   Preprocedure ultrasound evaluation of the right internal jugular vein demonstrated a patent and compressible vein free of internal echoes. Procedure was planned. Subdermal Local anesthesia was provided 1% lidocaine. A small skin nick was made. Under direct ultrasound visualization, a 21 gauge micropuncture needle was directed into the internal jugular vein. An image was captured and stored in the permanent record. A micropuncture set was inserted and exchanged  for a J wire which was positioned in the inferior vena cava. Serial dilation was performed followed by placement of a 12.5 French, 24 cm Trialysis catheter. The catheter tip was positioned in the right atrium. Each lumen flushed and aspirated appropriately. The dialysis ports were locked with appropriate volume of heparin dwell. The middle central venous port was then used for sedation for the remainder of the procedure. The catheter was secured with a 0 silk retention suture. A sterile bandage was applied.   Preprocedure ultrasound evaluation of the right groin was performed which demonstrated a patent right common femoral artery. The procedure was planned. Subdermal Local anesthesia was provided with 1% lidocaine. A small skin nick was made. Under direct ultrasound visualization, a 21 gauge micropuncture needle was directed into the common femoral artery. An ultrasound image was captured and stored in the permanent record. A micro puncture set was inserted and a limited right lower extremity angiogram was performed which demonstrated appropriate puncture site for closure device use. A J wire was directed to the abdominal aorta and the micropuncture set was exchanged for a 5 [place] vascular sheath. A 5 French C2 catheter was then directed into the right renal ostium. Right renal angiogram was performed. The single main renal artery was patent. About 2 arcuate artery branches in the inferior pole there is abnormal  truncation and vessel irregularity with evidence of an early filling arteriovenous fistula in addition to suggestion of faint filling into the collecting system on delayed imaging.   A straight lantern microcatheter and 0.014" soft synchro wire was then inserted and directed into the inferior polar branch. Repeat angiogram was performed in the sub selective location which was significant for multifocal 2-4 mm pseudoaneurysm formation, abnormal truncation and irregularity of the arcuate and interlobular branches, an early arteriovenous shunting. The inferior polar arcuate branch was selected further. Coil embolization was performed with multiple low profile Penumbra Ruby coils ranging from 2-3 mm in diameter. Completion right inferior renal angiogram was performed which demonstrated appropriate embolization of the targeted vessels without persistent arteriovenous fistula or vessel irregularity.   The catheters were removed. The right common femoral artery was then closed with a 6 [place] Angio-Seal device. Distal pulses were unchanged. The patient tolerated the procedure well was transferred back to the floor in good condition.   IMPRESSION: 1. 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 2. Sub selective coil embolization of right inferior polar arcuate artery branch. 3. Successful placement of right internal jugular, 24 French Trialysis catheter with the catheter tip in the right atrium. The catheter is ready for immediate use.   Ruthann Cancer, MD   [facility]   [facility]     Electronically Signed   By: Ruthann Cancer M.D.   On: 08/31/2021 09:29    IR Fluoro Guide CV Line Right  Result Date: 09/18/2021 INDICATION: 70 year old male referred for tunneled hemodialysis catheter EXAM: TUNNELED CENTRAL VENOUS HEMODIALYSIS  CATHETER PLACEMENT WITH ULTRASOUND AND FLUOROSCOPIC GUIDANCE MEDICATIONS: 2 g Ancef. The antibiotic was given in an appropriate time interval prior to skin puncture. ANESTHESIA/SEDATION: Moderate (conscious) sedation was employed during this procedure. A total of Versed 4.0 mg and Fentanyl 200 mcg was administered intravenously. Moderate Sedation Time: 20 minutes. The patient's level of consciousness and vital signs were monitored continuously by radiology nursing throughout the procedure under my direct supervision. FLUOROSCOPY TIME:  Fluoroscopy  Time: 0 minutes 30 seconds (2 mGy). COMPLICATIONS: None PROCEDURE: Informed written consent was obtained from the patient after a discussion of the risks, benefits, and alternatives to treatment. Questions regarding the procedure were encouraged and answered. The right neck and chest were prepped with chlorhexidine in a sterile fashion, and a sterile drape was applied covering the operative field. Maximum barrier sterile technique with sterile gowns and gloves were used for the procedure. A timeout was performed prior to the initiation of the procedure. Ultrasound survey was performed. Micropuncture kit was utilized to access the right internal jugular vein under direct, real-time ultrasound guidance after the overlying soft tissues were anesthetized with 1% lidocaine with epinephrine. Stab incision was made with 11 blade scalpel. Microwire would not pass centrally. Once we confirmed that the tip of the needle was within the partially occluded IJ, a Nitrex wire was passed into the right heart. The needle was removed and the micro puncture was set was advanced onto the Nitrex wire. Nitrex wire was then exchanged for the standard microwire. The microwire was then marked to measure appropriate internal catheter length. External tunneled length was estimated. A total tip to cuff length of 19 cm was selected. 035 guidewire was advanced to the level of the IVC. Skin and  subcutaneous tissues of chest wall below the clavicle were generously infiltrated with 1% lidocaine for local anesthesia. A small stab incision was made with 11 blade scalpel. The selected hemodialysis catheter was tunneled in a retrograde fashion from the anterior chest wall to the venotomy incision. Serial dilation was performed and then a peel-away sheath was placed. The catheter was then placed through the peel-away sheath with tips ultimately positioned within the superior aspect of the right atrium. Final catheter positioning was confirmed and documented with a spot radiographic image. The catheter aspirates and flushes normally. The catheter was flushed with appropriate volume heparin dwells. The catheter exit site was secured with a 0-Prolene retention suture. Gel-Foam slurry was infused into the soft tissue tract. The venotomy incision was closed Derma bond and sterile dressing. Dressings were applied at the chest wall. Patient tolerated the procedure well and remained hemodynamically stable throughout. No complications were encountered and no significant blood loss encountered. IMPRESSION: Status post image guided placement of right IJ tunneled hemodialysis catheter. Signed, Dulcy Fanny. Dellia Nims, RPVI [facility] Electronically Signed   By: Corrie Mckusick D.O.   On: 09/18/2021 13:03   IR Fluoro Guide CV Line Right  Result Date: 08/31/2021 INDICATION: 70 year old male with history of acute kidney injury of uncertain etiology status post ultrasound-guided right renal biopsy on 08/28/2021. Since biopsy, the patient has experienced gross hematuria with associated acute anemia. EXAM: 1. Ultrasound-guided vascular access of the right internal jugular vein. 2. Temporary hemodialysis catheter placement. 3. Ultrasound-guided vascular access of the right common femoral artery. 4. Selective catheterization and angiography of the right renal artery. 5. Sub  selective catheterization angiography of right inferior polar and arcuate artery branches. 6. Coil embolization of right inferior pole arcuate artery branch. MEDICATIONS: None. ANESTHESIA/SEDATION: Moderate (conscious) sedation was employed during this procedure. A total of Versed 3 mg and Fentanyl 50 mcg was administered intravenously. Moderate Sedation Time: 66 minutes. The patient's level of consciousness and vital signs were monitored continuously by radiology nursing throughout the procedure under my direct supervision. CONTRAST:  90m OMNIPAQUE IOHEXOL 350 MG/ML SOLN, 532mOMNIPAQUE IOHEXOL 350 MG/ML SOLN FLUOROSCOPY TIME:  Fluoroscopy Time: 10.6 minutes, (811 mGy). COMPLICATIONS: None immediate.  PROCEDURE: Informed consent was obtained from the patient following explanation of the procedure, risks, benefits and alternatives. The patient understands, agrees and consents for the procedure. All questions were addressed. A time out was performed prior to the initiation of the procedure. Maximal barrier sterile technique utilized including caps, mask, sterile gowns, sterile gloves, large sterile drape, hand hygiene, and chlorhexidine prep. Preprocedure ultrasound evaluation of the right internal jugular vein demonstrated a patent and compressible vein free of internal echoes. Procedure was planned. Subdermal Local anesthesia was provided 1% lidocaine. A small skin nick was made. Under direct ultrasound visualization, a 21 gauge micropuncture needle was directed into the internal jugular vein. An image was captured and stored in the permanent record. A micropuncture set was inserted and exchanged for a J wire which was positioned in the inferior vena cava. Serial dilation was performed followed by placement of a 12.5 French, 24 cm Trialysis catheter. The catheter tip was positioned in the right atrium. Each lumen flushed and aspirated appropriately. The dialysis ports were locked with appropriate volume of heparin  dwell. The middle central venous port was then used for sedation for the remainder of the procedure. The catheter was secured with a 0 silk retention suture. A sterile bandage was applied. Preprocedure ultrasound evaluation of the right groin was performed which demonstrated a patent right common femoral artery. The procedure was planned. Subdermal Local anesthesia was provided with 1% lidocaine. A small skin nick was made. Under direct ultrasound visualization, a 21 gauge micropuncture needle was directed into the common femoral artery. An ultrasound image was captured and stored in the permanent record. A micro puncture set was inserted and a limited right lower extremity angiogram was performed which demonstrated appropriate puncture site for closure device use. A J wire was directed to the abdominal aorta and the micropuncture set was exchanged for a 5 [place] vascular sheath. A 5 French C2 catheter was then directed into the right renal ostium. Right renal angiogram was performed. The single main renal artery was patent. 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 A straight lantern microcatheter and 0.014" soft synchro wire was then inserted and directed into the inferior polar branch. Repeat angiogram was performed in the sub selective location which was significant for multifocal 2-4 mm pseudoaneurysm formation, abnormal truncation and irregularity of the arcuate and interlobular branches, an early arteriovenous shunting. The inferior polar arcuate branch was selected further. Coil embolization was performed with multiple low profile Penumbra Ruby coils ranging from 2-3 mm in diameter. Completion right inferior renal angiogram was performed which demonstrated appropriate embolization of the targeted  vessels without persistent arteriovenous fistula or vessel irregularity. The catheters were removed. The right common femoral artery was then closed with a 6 [place] Angio-Seal device. Distal pulses were unchanged. The patient tolerated the procedure well was transferred back to the floor in good condition. IMPRESSION: 1. 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 2. Sub selective coil embolization of right inferior polar arcuate artery branch. 3. Successful placement of right internal jugular, 24 French Trialysis catheter with the catheter tip in the right atrium. The catheter is ready for immediate use. Ruthann Cancer, MD [facility] Radiology Electronically Signed  By: Ruthann Cancer M.D.   On: 08/31/2021 09:29   IR US Guide Vasc Access Right  Result Date: 09/18/2021 INDICATION: 70 year old male referred for tunneled hemodialysis catheter EXAM: TUNNELED CENTRAL VENOUS HEMODIALYSIS CATHETER PLACEMENT WITH ULTRASOUND AND FLUOROSCOPIC GUIDANCE MEDICATIONS: 2 g Ancef. The antibiotic was given in an appropriate time interval prior to skin puncture. ANESTHESIA/SEDATION: Moderate (conscious) sedation was employed during this procedure. A total of Versed 4.0 mg and Fentanyl 200 mcg was administered intravenously. Moderate Sedation Time: 20 minutes. The patient's level of consciousness and vital signs were monitored continuously by radiology nursing throughout the procedure under my direct supervision. FLUOROSCOPY TIME:  Fluoroscopy Time: 0 minutes 30 seconds (2 mGy). COMPLICATIONS: None PROCEDURE: Informed written consent was obtained from the patient after a discussion of the risks, benefits, and alternatives to treatment. Questions regarding the procedure were encouraged and answered. The right neck and chest were prepped  with chlorhexidine in a sterile fashion, and a sterile drape was applied covering the operative field. Maximum barrier sterile technique with sterile gowns and gloves were used for the procedure. A timeout was performed prior to the initiation of the procedure. Ultrasound survey was performed. Micropuncture kit was utilized to access the right internal jugular vein under direct, real-time ultrasound guidance after the overlying soft tissues were anesthetized with 1% lidocaine with epinephrine. Stab incision was made with 11 blade scalpel. Microwire would not pass centrally. Once we confirmed that the tip of the needle was within the partially occluded IJ, a Nitrex wire was passed into the right heart. The needle was removed and the micro puncture was set was advanced onto the Nitrex wire. Nitrex wire was then exchanged for the standard microwire. The microwire was then marked to measure appropriate internal catheter length. External tunneled length was estimated. A total tip to cuff length of 19 cm was selected. 035 guidewire was advanced to the level of the IVC. Skin and subcutaneous tissues of chest wall below the clavicle were generously infiltrated with 1% lidocaine for local anesthesia. A small stab incision was made with 11 blade scalpel. The selected hemodialysis catheter was tunneled in a retrograde fashion from the anterior chest wall to the venotomy incision. Serial dilation was performed and then a peel-away sheath was placed. The catheter was then placed through the peel-away sheath with tips ultimately positioned within the superior aspect of the right atrium. Final catheter positioning was confirmed and documented with a spot radiographic image. The catheter aspirates and flushes normally. The catheter was flushed with appropriate volume heparin dwells. The catheter exit site was secured with a 0-Prolene retention suture. Gel-Foam slurry was infused into the soft tissue tract. The venotomy incision was  closed Derma bond and sterile dressing. Dressings were applied at the chest wall. Patient tolerated the procedure well and remained hemodynamically stable throughout. No complications were encountered and no significant blood loss encountered. IMPRESSION: Status post image guided placement of right IJ tunneled hemodialysis catheter. Signed, Dulcy Fanny. Dellia Nims, RPVI [facility] Radiology Electronically Signed   By: Corrie Mckusick D.O.   On: 09/18/2021 13:03   IR US Guide Vasc Access Right  Result Date: 08/31/2021 INDICATION: 70 year old male with history of acute kidney injury of uncertain etiology status post ultrasound-guided right renal biopsy on 08/28/2021. Since biopsy, the patient has experienced gross hematuria with associated acute anemia. EXAM: 1. Ultrasound-guided vascular access of the right internal jugular vein. 2. Temporary hemodialysis catheter placement. 3. Ultrasound-guided vascular access of the right common  femoral artery. 4. Selective catheterization and angiography of the right renal artery. 5. Sub selective catheterization angiography of right inferior polar and arcuate artery branches. 6. Coil embolization of right inferior pole arcuate artery branch. MEDICATIONS: None. ANESTHESIA/SEDATION: Moderate (conscious) sedation was employed during this procedure. A total of Versed 3 mg and Fentanyl 50 mcg was administered intravenously. Moderate Sedation Time: 66 minutes. The patient's level of consciousness and vital signs were monitored continuously by radiology nursing throughout the procedure under my direct supervision. CONTRAST:  69m OMNIPAQUE IOHEXOL 350 MG/ML SOLN, 564mOMNIPAQUE IOHEXOL 350 MG/ML SOLN FLUOROSCOPY TIME:  Fluoroscopy Time: 10.6 minutes, (811 mGy). COMPLICATIONS: None immediate. PROCEDURE: Informed consent was obtained from the patient following explanation of the procedure, risks, benefits and alternatives. The patient  understands, agrees and consents for the procedure. All questions were addressed. A time out was performed prior to the initiation of the procedure. Maximal barrier sterile technique utilized including caps, mask, sterile gowns, sterile gloves, large sterile drape, hand hygiene, and chlorhexidine prep. Preprocedure ultrasound evaluation of the right internal jugular vein demonstrated a patent and compressible vein free of internal echoes. Procedure was planned. Subdermal Local anesthesia was provided 1% lidocaine. A small skin nick was made. Under direct ultrasound visualization, a 21 gauge micropuncture needle was directed into the internal jugular vein. An image was captured and stored in the permanent record. A micropuncture set was inserted and exchanged for a J wire which was positioned in the inferior vena cava. Serial dilation was performed followed by placement of a 12.5 French, 24 cm Trialysis catheter. The catheter tip was positioned in the right atrium. Each lumen flushed and aspirated appropriately. The dialysis ports were locked with appropriate volume of heparin dwell. The middle central venous port was then used for sedation for the remainder of the procedure. The catheter was secured with a 0 silk retention suture. A sterile bandage was applied. Preprocedure ultrasound evaluation of the right groin was performed which demonstrated a patent right common femoral artery. The procedure was planned. Subdermal Local anesthesia was provided with 1% lidocaine. A small skin nick was made. Under direct ultrasound visualization, a 21 gauge micropuncture needle was directed into the common femoral artery. An ultrasound image was captured and stored in the permanent record. A micro puncture set was inserted and a limited right lower extremity angiogram was performed which demonstrated appropriate puncture site for closure device use. A J wire was directed to the abdominal aorta and the micropuncture set was  exchanged for a 5 [place] sheath. A 5 French C2 catheter was then directed into the right renal ostium. Right renal angiogram was performed. The single main renal artery was patent. 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 A straight lantern microcatheter and 0.014" soft synchro wire was then inserted and directed into the inferior polar branch. Repeat angiogram was performed in the sub selective location which was significant for multifocal 2-4 mm pseudoaneurysm formation, abnormal truncation and irregularity of the arcuate and interlobular branches, an early arteriovenous shunting. The inferior polar arcuate branch was selected further. Coil embolization was performed with multiple low profile Penumbra Ruby coils ranging from 2-3 mm in diameter. Completion right inferior renal angiogram was performed which demonstrated appropriate embolization of the targeted vessels without persistent arteriovenous fistula or vessel irregularity. The catheters were removed. The  right common femoral artery was then closed with a 6 [place] Angio-Seal device. Distal pulses were unchanged. The patient tolerated the procedure well was transferred back to the floor in good condition. IMPRESSION: 1. 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 2. Sub selective coil embolization of right inferior polar arcuate artery branch. 3. Successful placement of right internal jugular, 24 French Trialysis catheter with the catheter tip in the right atrium. The catheter is ready for immediate use. Ruthann Cancer, MD [facility] Inter[facility] Radiology Special[facility] Radiology  Electronically Signed   By: Ruthann Cancer M.D.   On: 08/31/2021 09:29   IR US Guide Vasc Access Right  Result Date: 08/31/2021 INDICATION: 70 year old male with history of acute kidney injury of uncertain etiology status post ultrasound-guided right renal biopsy on 08/28/2021. Since biopsy, the patient has experienced gross hematuria with associated acute anemia. EXAM: 1. Ultrasound-guided vascular access of the right internal jugular vein. 2. Temporary hemodialysis catheter placement. 3. Ultrasound-guided vascular access of the right common femoral artery. 4. Selective catheterization and angiography of the right renal artery. 5. Sub selective catheterization angiography of right inferior polar and arcuate artery branches. 6. Coil embolization of right inferior pole arcuate artery branch. MEDICATIONS: None. ANESTHESIA/SEDATION: Moderate (conscious) sedation was employed during this procedure. A total of Versed 3 mg and Fentanyl 50 mcg was administered intravenously. Moderate Sedation Time: 66 minutes. The patient's level of consciousness and vital signs were monitored continuously by radiology nursing throughout the procedure under my direct supervision. CONTRAST:  59m OMNIPAQUE IOHEXOL 350 MG/ML SOLN, 554mOMNIPAQUE IOHEXOL 350 MG/ML SOLN FLUOROSCOPY TIME:  Fluoroscopy Time: 10.6 minutes, (811 mGy). COMPLICATIONS: None immediate. PROCEDURE: Informed consent was obtained from the patient following explanation of the procedure, risks, benefits and alternatives. The patient understands, agrees and consents for the procedure. All questions were addressed. A time out was performed prior to the initiation of the procedure. Maximal barrier sterile technique utilized including caps, mask, sterile gowns, sterile gloves, large sterile drape, hand hygiene, and chlorhexidine prep. Preprocedure ultrasound evaluation of the right internal jugular vein demonstrated a patent and compressible vein free of internal echoes.  Procedure was planned. Subdermal Local anesthesia was provided 1% lidocaine. A small skin nick was made. Under direct ultrasound visualization, a 21 gauge micropuncture needle was directed into the internal jugular vein. An image was captured and stored in the permanent record. A micropuncture set was inserted and exchanged for a J wire which was positioned in the inferior vena cava. Serial dilation was performed followed by placement of a 12.5 French, 24 cm Trialysis catheter. The catheter tip was positioned in the right atrium. Each lumen flushed and aspirated appropriately. The dialysis ports were locked with appropriate volume of heparin dwell. The middle central venous port was then used for sedation for the remainder of the procedure. The catheter was secured with a 0 silk retention suture. A sterile bandage was applied. Preprocedure ultrasound evaluation of the right groin was performed which demonstrated a patent right common femoral artery. The procedure was planned. Subdermal Local anesthesia was provided with 1% lidocaine. A small skin nick was made. Under direct ultrasound visualization, a 21 gauge micropuncture needle was directed into the common femoral artery. An ultrasound image was captured and stored in the permanent record. A micro puncture set was inserted and a limited right lower extremity angiogram was performed which demonstrated appropriate puncture site for closure  device use. A J wire was directed to the abdominal aorta and the micropuncture set was exchanged for a 5 [place] vascular sheath. A 5 French C2 catheter was then directed into the right renal ostium. Right renal angiogram was performed. The single main renal artery was patent. 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 A  straight lantern microcatheter and 0.014" soft synchro wire was then inserted and directed into the inferior polar branch. Repeat angiogram was performed in the sub selective location which was significant for multifocal 2-4 mm pseudoaneurysm formation, abnormal truncation and irregularity of the arcuate and interlobular branches, an early arteriovenous shunting. The inferior polar arcuate branch was selected further. Coil embolization was performed with multiple low profile Penumbra Ruby coils ranging from 2-3 mm in diameter. Completion right inferior renal angiogram was performed which demonstrated appropriate embolization of the targeted vessels without persistent arteriovenous fistula or vessel irregularity. The catheters were removed. The right common femoral artery was then closed with a 6 [place] Angio-Seal device. Distal pulses were unchanged. The patient tolerated the procedure well was transferred back to the floor in good condition. IMPRESSION: 1. 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 2. Sub selective coil embolization of right inferior polar arcuate artery branch. 3. Successful placement of right internal jugular, 24 French Trialysis catheter with the catheter tip in the right atrium. The catheter is ready for immediate use. Ruthann Cancer, MD Vascular and Interventional Radiology Specialists [facility] Radiology Electronically Signed   By: Ruthann Cancer M.D.   On: 08/31/2021 09:29   DG Chest Port 1 View  Result Date: 09/03/2021 CLINICAL DATA:  Shortness of breath EXAM: PORTABLE CHEST 1 VIEW COMPARISON:  08/23/2021 FINDINGS: Right dialysis catheter in place with the tip in the right atrium. Heart is normal size. Mild vascular congestion and interstitial prominence throughout the lungs, likely mild edema. No effusions or acute bony abnormality.  IMPRESSION: Suspect mild pulmonary edema. Electronically Signed   By: Rolm Baptise M.D.   On: 09/03/2021 09:40   DG Bone Survey Met  Result Date: 09/08/2021 CLINICAL DATA:  Left hip pain. EXAM: METASTATIC BONE SURVEY COMPARISON:  None. FINDINGS: No metastatic lesions identified. No cause for left hip pain identified. IMPRESSION: No acute abnormalities identified. No metastatic lesions. No cause for hip pain noted. Electronically Signed   By: Dorise Bullion III M.D.   On: 09/08/2021 14:32   MR CARDIAC MORPHOLOGY WO CONTRAST  Result Date: 09/10/2021 CLINICAL DATA:  Concern for cardiac amyloidosis. EXAM: CARDIAC MRI TECHNIQUE: The patient was scanned on a 1.5 Tesla GE magnet. A dedicated cardiac coil was used. Functional imaging was done using Fiesta sequences. 2,3, and 4 chamber views were done to assess for RWMA's. Modified Simpson's rule using a short axis stack was used to calculate an ejection fraction on a dedicated work Conservation officer, nature. T1 and T2 sequences done. No contrast due to AKI and unstable renal function. CONTRAST:  None FINDINGS: Limited images of the lung fields showed small bilateral pleural effusions. Small inferior pericardial effusion. Normal left ventricular size with moderate LV hypertrophy. Moderate diffuse hypokinesis with EF 38%. Normal right ventricular size with mild-moderate dysfunction, EF 36%. The aortic valve is trileaflet with no significant stenosis or  regurgitation. Normal right atrial size. Mild left atrial enlargement. No significant mitral regurgitation. Measurements: T1 1217 T2 53 LVEDV 134 mL LVSV 51 mL LVEF 38% RVEDV 98 mL RVSV 35 mL RVEF 36% IMPRESSION: 1. Normal LV size with moderate LV hypertrophy. EF 38% with diffuse hypokinesis. 2.  Normal RV size with EF 36%. 3.  T1 elevated, T2 borderline elevated. 4.  Small inferior pericardial effusion. 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 Dalton Mclean Electronically Signed   By: Loralie Champagne M.D.   On: 09/10/2021 17:55   CT BONE MARROW BIOPSY & ASPIRATION  Result Date: 09/05/2021 CLINICAL DATA:  Amyloid kidney EXAM: CT GUIDED DEEP ILIAC BONE ASPIRATION AND CORE BIOPSY TECHNIQUE: Patient was placed prone on the CT gantry and limited axial scans through the pelvis were obtained. Appropriate skin entry site was identified. Skin site was marked, prepped with chlorhexidine, draped in usual sterile fashion, and infiltrated locally with 1% lidocaine. Intravenous Fentanyl 80mg and Versed 251mwere administered as conscious sedation during continuous monitoring of the patient's level of consciousness and physiological / cardiorespiratory status by the radiology RN, with a total moderate sedation time of 10 minutes. Under CT fluoroscopic guidance an 11-gauge Cook trocar bone needle was advanced into the right iliac bone just lateral to the sacroiliac joint. Once needle tip position was confirmed, core and aspiration samples were obtained, submitted to pathology for approval. Post procedure scans show no hematoma or fracture. Patient tolerated procedure well. COMPLICATIONS: COMPLICATIONS none IMPRESSION: 1. Technically successful CT guided right iliac bone core and aspiration biopsy. Electronically Signed   By: D Lucrezia Europe.D.   On: 09/05/2021 10:48   ECHOCARDIOGRAM COMPLETE  Result Date: 09/08/2021    ECHOCARDIOGRAM REPORT   Patient Name:   Caleb LAXate of Exam: 09/08/2021 Medical Rec #:  [id number]  Height:       70.0 in Accession #:    [id number] Weight:       156.5 lb Date of Birth:  07/03/15/1952   BSA:          1.881 m Patient Age:    [age]     BP:           130/78 mmHg Patient Gender: M            HR:           75 bpm. Exam Location:  Inpatient Procedure: 2D Echo, Cardiac Doppler and Color Doppler Indications:    Bacteremia R78.81  History:        Patient has prior  history of Echocardiogram examinations, most                 recent 08/24/2021. Risk Factors:Current Smoker. Generalized                 weakness/shortness of breath-found to have AKI and severe                 normocytic anemia.  Sonographer:    BeAlvino ChapelCS Referring Phys: [id number]. Left ventricular ejection fraction, by estimation, is 45 to 50%. The left ventricle has mildly decreased function. The left ventricle demonstrates global hypokinesis. There is moderate concentric left ventricular hypertrophy. Left ventricular diastolic parameters are consistent with Grade II diastolic dysfunction (pseudonormalization).  2. Right ventricular systolic function is normal.  The right ventricular size is normal. Tricuspid regurgitation signal is inadequate for assessing PA pressure.  3. Left atrial size was moderately dilated.  4. A small to moderate pericardial effusion is present. The pericardial effusion is anterior to the right ventricle. There is no evidence of cardiac tamponade.  5. The mitral valve is grossly normal. Mild mitral valve regurgitation.  6. The aortic valve is tricuspid. There is mild thickening of the aortic valve. Aortic valve regurgitation is not visualized.  7. The inferior vena cava is normal in size with greater than 50% respiratory variability, suggesting right atrial pressure of 3 mmHg.  8. No obvious valvular vegetations. Comparison(s): Prior images reviewed side by side. LVEF mildly reduced and pericardial effusion is new. FINDINGS  Left Ventricle: Left ventricular ejection fraction, by estimation, is 45 to 50%. The left ventricle has mildly decreased function. The left ventricle demonstrates global hypokinesis. The left ventricular internal cavity size was normal in size. There is  moderate concentric left ventricular hypertrophy. Left ventricular diastolic parameters are consistent with Grade II diastolic dysfunction (pseudonormalization). Right Ventricle:  The right ventricular size is normal. No increase in right ventricular wall thickness. Right ventricular systolic function is normal. Tricuspid regurgitation signal is inadequate for assessing PA pressure. Left Atrium: Left atrial size was moderately dilated. Right Atrium: Right atrial size was normal in size. Pericardium: A small pericardial effusion is present. The pericardial effusion is anterior to the right ventricle. There is no evidence of cardiac tamponade. Mitral Valve: The mitral valve is grossly normal. There is mild thickening of the mitral valve leaflet(s). Mild mitral annular calcification. Mild mitral valve regurgitation. Tricuspid Valve: The tricuspid valve is grossly normal. Tricuspid valve regurgitation is trivial. Aortic Valve: The aortic valve is tricuspid. There is mild thickening of the aortic valve. There is mild to moderate aortic valve annular calcification. Aortic valve regurgitation is not visualized. Pulmonic Valve: The pulmonic valve was grossly normal. Pulmonic valve regurgitation is trivial. Aorta: The aortic root is normal in size and structure. Venous: The inferior vena cava is normal in size with greater than 50% respiratory variability, suggesting right atrial pressure of 3 mmHg. IAS/Shunts: No atrial level shunt detected by color flow Doppler.  LEFT VENTRICLE PLAX 2D LVIDd:         5.10 cm  Diastology LVIDs:         3.90 cm  LV e' medial:    3.77 cm/s LV PW:         1.50 cm  LV E/e' medial:  22.9 LV IVS:        1.40 cm  LV e' lateral:   7.18 cm/s LVOT diam:     2.00 cm  LV E/e' lateral: 12.0 LV SV:         51 LV SV Index:   27 LVOT Area:     3.14 cm  RIGHT VENTRICLE RV S prime:     14.40 cm/s TAPSE (M-mode): 1.9 cm LEFT ATRIUM              Index       RIGHT ATRIUM           Index LA diam:        4.20 cm  2.23 cm/m  RA Area:     14.50 cm LA Vol (A2C):   104.0 ml 55.29 ml/m RA Volume:   36.00 ml  19.14 ml/m LA Vol (A4C):   64.4 ml  34.24 ml/m LA Biplane Vol: 83.7 ml  44.50  ml/m  AORTIC VALVE LVOT Vmax:   89.00 cm/s LVOT Vmean:  53.600 cm/s LVOT VTI:    0.161 m  AORTA Ao Root diam: 3.50 cm MITRAL VALVE MV Area (PHT): 3.65 cm    SHUNTS MV Decel Time: 208 msec    Systemic VTI:  0.16 m MV E velocity: 86.40 cm/s  Systemic Diam: 2.00 cm MV A velocity: 63.70 cm/s MV E/A ratio:  1.36 Rozann Lesches MD Electronically signed by Rozann Lesches MD Signature Date/Time: 09/08/2021/5:26:36 PM    Final    US BIOPSY (KIDNEY)  Result Date: 08/28/2021 INDICATION: Acute kidney injury of uncertain etiology. Please perform image guided biopsy for tissue diagnostic purposes. EXAM: ULTRASOUND GUIDED RENAL BIOPSY COMPARISON:  CT abdomen and pelvis-08/23/2021 MEDICATIONS: None. ANESTHESIA/SEDATION: Fentanyl 100 mcg IV; Versed 2 mg IV Total Moderate Sedation time: 14 minutes; The patient was continuously monitored during the procedure by the interventional radiology nurse under my direct supervision. COMPLICATIONS: None immediate. PROCEDURE: Informed written consent was obtained from the patient after a discussion of the risks, benefits and alternatives to treatment. The patient understands and consents the procedure. A timeout was performed prior to the initiation of the procedure. Ultrasound scanning was performed of the bilateral flanks. The inferior pole of the right kidney was selected for biopsy due to location and sonographic window. The procedure was planned. The operative site was prepped and draped in the usual sterile fashion. The overlying soft tissues were anesthetized with 1% lidocaine with epinephrine. A 17 gauge core needle biopsy device was advanced into the inferior cortex of the right kidney and 3 core biopsies were obtained under direct ultrasound guidance. Images were saved for documentation purposes. The biopsy device was removed and hemostasis was obtained with manual compression. Post procedural scanning was negative for significant post procedural hemorrhage or additional  complication. A dressing was placed. The patient tolerated the procedure well without immediate post procedural complication. IMPRESSION: Technically successful ultrasound guided right renal biopsy. Electronically Signed   By: Sandi Mariscal M.D.   On: 08/28/2021 12:52   IR EMBO ART  VEN HEMORR LYMPH EXTRAV  INC GUIDE ROADMAPPING  Result Date: 08/31/2021 INDICATION: 69 year old male with history of acute kidney injury of uncertain etiology status post ultrasound-guided right renal biopsy on 08/28/2021. Since biopsy, the patient has experienced gross hematuria with associated acute anemia. EXAM: 1. Ultrasound-guided vascular access of the right internal jugular vein. 2. Temporary hemodialysis catheter placement. 3. Ultrasound-guided vascular access of the right common femoral artery. 4. Selective catheterization and angiography of the right renal artery. 5. Sub selective catheterization angiography of right inferior polar and arcuate artery branches. 6. Coil embolization of right inferior pole arcuate artery branch. MEDICATIONS: None. ANESTHESIA/SEDATION: Moderate (conscious) sedation was employed during this procedure. A total of Versed 3 mg and Fentanyl 50 mcg was administered intravenously. Moderate Sedation Time: 66 minutes. The patient's level of consciousness and vital signs were monitored continuously by radiology nursing throughout the procedure under my direct supervision. CONTRAST:  42m OMNIPAQUE IOHEXOL 350 MG/ML SOLN, 579mOMNIPAQUE IOHEXOL 350 MG/ML SOLN FLUOROSCOPY TIME:  Fluoroscopy Time: 10.6 minutes, (811 mGy). COMPLICATIONS: None immediate. PROCEDURE: Informed consent was obtained from the patient following explanation of the procedure, risks, benefits and alternatives. The patient understands, agrees and consents for the procedure. All questions were addressed. A time out was performed prior to the initiation of the procedure. Maximal barrier sterile technique utilized including caps, mask,  sterile gowns, sterile gloves, large sterile drape, hand hygiene, and chlorhexidine prep. Preprocedure ultrasound  evaluation of the right internal jugular vein demonstrated a patent and compressible vein free of internal echoes. Procedure was planned. Subdermal Local anesthesia was provided 1% lidocaine. A small skin nick was made. Under direct ultrasound visualization, a 21 gauge micropuncture needle was directed into the internal jugular vein. An image was captured and stored in the permanent record. A micropuncture set was inserted and exchanged for a J wire which was positioned in the inferior vena cava. Serial dilation was performed followed by placement of a 12.5 French, 24 cm Trialysis catheter. The catheter tip was positioned in the right atrium. Each lumen flushed and aspirated appropriately. The dialysis ports were locked with appropriate volume of heparin dwell. The middle central venous port was then used for sedation for the remainder of the procedure. The catheter was secured with a 0 silk retention suture. A sterile bandage was applied. Preprocedure ultrasound evaluation of the right groin was performed which demonstrated a patent right common femoral artery. The procedure was planned. Subdermal Local anesthesia was provided with 1% lidocaine. A small skin nick was made. Under direct ultrasound visualization, a 21 gauge micropuncture needle was directed into the common femoral artery. An ultrasound image was captured and stored in the permanent record. A micro puncture set was inserted and a limited right lower extremity angiogram was performed which demonstrated appropriate puncture site for closure device use. A J wire was directed to the abdominal aorta and the micropuncture set was exchanged for a 5 [place] vascular sheath. A 5 French C2 catheter was then directed into the right renal ostium. Right renal angiogram was performed. The single main renal artery was patent. About 2 arcuate artery  branches in the inferior pole there is abnormal truncation and vessel irregularity with evidence of an early filling arteriovenous fistula in addition to suggestion of faint filling into the collecting system on delayed imaging. A straight lantern microcatheter and 0.014" soft synchro wire was then inserted and directed into the inferior polar branch. Repeat angiogram was performed in the sub selective location which was significant for multifocal 2-4 mm pseudoaneurysm formation, abnormal truncation and irregularity of the arcuate and interlobular branches, an early arteriovenous shunting. The inferior polar arcuate branch was selected further. Coil embolization was performed with multiple low profile Penumbra Ruby coils ranging from 2-3 mm in diameter. Completion right inferior renal angiogram was performed which demonstrated appropriate embolization of the targeted vessels without persistent arteriovenous fistula or vessel irregularity. The catheters were removed. The right common femoral artery was then closed with a 6 [place] Angio-Seal device. Distal pulses were unchanged. The patient tolerated the procedure well was transferred back to the floor in good condition. IMPRESSION: 1. 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 2. Sub selective coil embolization of right inferior polar arcuate artery branch. 3. Successful placement of right internal jugular, 24 French Trialysis catheter with the catheter tip in the right atrium. The catheter is ready for immediate use. Ruthann Cancer, MD [facility] Electronically Signed   By: Ruthann Cancer M.D.   On: 08/31/2021 09:29   VAS [place] UPPER EXT VEIN MAPPING (PRE-OP AVF)  Result Date: 09/20/2021 UPPER EXTREMITY VEIN MAPPING Patient Name:  ZEDEKIAH HINDERMAN  Date of Exam:    09/20/2021 Medical Rec #: [id number]     Accession #:    [id number] Date of Birth: Sep 09, 1951  Patient Gender: M Patient Age:   29 years Exam Location:  [facility] Procedure:      VAS [place] UPPER EXT VEIN MAPPING (PRE-OP AVF) Referring Phys: Jeneen Rinks LIN --------------------------------------------------------------------------------  Indications: Pre-access. Comparison Study: No prior studies. Performing Technologist: Darlin Coco RDMS, RVT  Examination Guidelines: A complete evaluation includes B-mode imaging, spectral Doppler, color Doppler, and power Doppler as needed of all accessible portions of each vessel. Bilateral testing is considered an integral part of a complete examination. Limited examinations for reoccurring indications may be performed as noted. +-----------------+-------------+----------+---------+ Right Cephalic   Diameter (cm)Depth (cm)Findings  +-----------------+-------------+----------+---------+ Shoulder             0.33        0.97             +-----------------+-------------+----------+---------+ Prox upper arm       0.36        0.75             +-----------------+-------------+----------+---------+ Mid upper arm        0.27        0.45   branching +-----------------+-------------+----------+---------+ Dist upper arm       0.23        0.20   branching +-----------------+-------------+----------+---------+ Antecubital fossa    0.23        0.17             +-----------------+-------------+----------+---------+ Prox forearm         0.21        0.24   branching +-----------------+-------------+----------+---------+ Mid forearm          0.25        0.27             +-----------------+-------------+----------+---------+ Dist forearm         0.19        0.32             +-----------------+-------------+----------+---------+ Wrist                0.20        0.29             +-----------------+-------------+----------+---------+  +-----------------+-------------+----------+---------+ Left Cephalic    Diameter (cm)Depth (cm)Findings  +-----------------+-------------+----------+---------+ Shoulder             0.25        1.23             +-----------------+-------------+----------+---------+ Prox upper arm       0.31        0.88             +-----------------+-------------+----------+---------+ Mid upper arm        0.37        0.52             +-----------------+-------------+----------+---------+ Dist upper arm     0.30/0.32  0.49/0.78 branching +-----------------+-------------+----------+---------+ Antecubital fossa    0.25        0.24   branching +-----------------+-------------+----------+---------+ Prox forearm         0.29        0.23             +-----------------+-------------+----------+---------+ Mid forearm        0.26/0.41  0.34/0.26 branching +-----------------+-------------+----------+---------+ Dist forearm         0.30        0.12             +-----------------+-------------+----------+---------+ Wrist  0.33        0.10             +-----------------+-------------+----------+---------+ *See table(s) above for measurements and observations.  Diagnosing physician: Orlie Pollen Electronically signed by Orlie Pollen on 09/20/2021 at 3:02:46 PM.    Final     Surgical Pathology  CASE: [id number]  PATIENT: Sandie Ano  Bone Marrow Report   Clinical History: Amyloid , right Iliac ([place])   DIAGNOSIS:   BONE MARROW, ASPIRATE, CLOT, CORE:  - Plasma cell myeloma, see comment.   PERIPHERAL BLOOD:  - Normocytic anemia.   COMMENT:   The marrow is normocellular but exhibits increased monoclonal plasma  cells (17% aspirate, 15-20% CD138 immunohistochemistry). The findings  are consistent with plasma cell myeloma. Congo red is pending and will  be reported in an addendum. There is some atypia in the megakaryocytes  and FISH for MDS was added for completeness.    MICROSCOPIC DESCRIPTION:   PERIPHERAL BLOOD SMEAR: There is a normocytic anemia with occasional  hypochromic cells.  There is no rouleaux formation.  Leukocytes are  present in normal numbers.  Circulating plasma cells are not identified.  Platelets are present in normal numbers.   BONE MARROW ASPIRATE: Spicular and cellular.  Erythroid precursors: Relative decrease in numbers.  No significant  dysplasia.  Granulocytic precursors: Relative increase in numbers.  No significant  dysplasia.  No increase in blasts.  Megakaryocytes: Mild increase in numbers.  Occasional forms with  hypolobated or abnormal nuclei.  Lymphocytes/plasma cells: Plasma cells are increased in numbers (6% by  manual differential counts) with atypical forms (multinucleation, large  forms).  Lymphocytes are not increased.   TOUCH PREPARATIONS: Similar to aspirate smears.   CLOT AND BIOPSY: The core biopsy and clot section are normocellular for  age (30%).  There is a mild myeloid hyperplasia.  Megakaryocytes are  increased in numbers with scattered atypical forms. CD138  immunohistochemistry reveals increased plasma cells (15-20%) which are  scattered and with small clusters. By light chain in situ hybridization  the plasma cells are lambda restricted.   IRON STAIN: Iron stains are performed on a bone marrow aspirate or touch  imprint smear and section of clot. The controls stained appropriately.        Storage Iron: Present       Ring Sideroblasts: Absent   ADDITIONAL DATA/TESTING: Cytogenetics, including FISH for myeloma and  MDS, was ordered and will be reported in an addendum.   CELL COUNT DATA:   Bone Marrow count performed on 500 cells shows:  Blasts:   0%   Myeloid:  66%  Promyelocytes: 0%   Erythroid:     11%  Myelocytes:    8%   Lymphocytes:   6%  Metamyelocytes:     1%   Plasma cells:  17%  Bands:    8%  Neutrophils:   41%  M:E ratio:     6.0  Eosinophils:   8%  Basophils:     0%   Monocytes:     0%   Lab Data: CBC performed on 09/05/21 shows:  WBC: 8.5 k/uL  Neutrophils:   57%  Hgb: 9.6 g/dL  Lymphocytes:   27%  HCT: 30.3 %    Monocytes:     8%  MCV: 84.6 fL   Eosinophils:   6%  RDW: 17.4 %    Basophils:     2%  PLT: 380 k/uL    ASSESSMENT AND PLAN: 1.  AL amyloidosis/plasma cell  myeloma 2.  Anemia secondary to renal insufficiency, iron deficiency, and folate deficiency 3.  Acute kidney injury secondary to #1 4.  History of nephrolithiasis 5.  Streptococcus agalactiae bacteremia diagnosed 09/07/2021, resolved   -Pt is clinically stable.  His CBC is stable, creatinine remains elevated.  He has restarted hemodialysis due to worsening renal function.  Plan is for outpatient dialysis on Monday, Wednesday, Friday.  Due to start outpatient dialysis on Wednesday, 09/26/2021. -We will proceed with Velcade and Cytoxan today.  Plan to add daratumumab as an outpatient on 09/27/2021.  Thereafter, will switch chemo day to Thursdays to avoid his hemodialysis schedule. -overall prognosis is poor, I have not see significant clinical benefit of chemo yet after two doses chemo, but it could be too early to tell, will repeat his M-protein and light chain level after 3-4 weeks of treatment.  -Recommend that he be discharged with antiemetics including Zofran and pain medication to take at home.   LOS: 32 days    Caleb Simmons  09/24/2021   Addendum  I have seen the patient, examined him. I agree with the assessment and and plan and have edited the notes.   Pt is clinically stable, lab reviewed, adequate for treatment, will proceed with week 3 CyBorD today, and plan to start Dara injection on 10/6 in our office. OK to discharge after chemo today.  Caleb Simmons  09/24/2021

## 2021-09-24 NOTE — Progress Notes (Addendum)
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  She is undergoing dialysis 09/07/2021.  We are dialyzing on a as needed basis.  We are coordinating treatment with his chemotherapy.   09/15/2021    we discussed dialysis and/or palliative medicine and he wishes to proceed with HD if necessary. 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 MRI suggestive of cardiac involvement   Assessment/ Plan:   #Acute kidney injury, nonoliguric, with history of excessive NSAID's use/BC powders every day.  Peaked creatinine level of 6.95. [name]  He underwent IR guided kidney biopsy on 9/6 complicated by postbiopsy bleeding.  Renal biopsy: AL amyloidosis, lambda light chain composition (glomeruli, interstitium, arteries, and arterioles). Marked IF, 59% global glomerular sclerosis and marked arteriosclerosis.     After extensive conversations regarding goals of care but wants to continue dialysis at this time.  It was discussed that given his complicated medical issues including amyloid involving the heart this will likely continue to be difficult.  Patient has been set up for outpatient dialysis.  He has a tunneled dialysis catheter but not willing to have permanent access placed at this time.  Given his prognosis is likely reasonable.  He is stable to undergo discharge when deemed fit and start dialysis outpatient on Wednesday at [facility].  He is receiving chemotherapy today and likely DC after. Plan to hold dialysis on Monday so doesn't have to get chemo and HD. Then start HD outpatient on Wed   #AL Amyloidosis on kidney biopsy 08/28/21: Also with heart involvement -Treatment per oncology -> d15 of cycle #1 as outpt on  10/3 CyBORD -Plan for chemotherapy today prior to discharge    #Sepsis 2/2 grp b strep.  Completed antibiotic course   #Anemia, appears to be relatively stable with a hemoglobin is unchanged - receiving IV iron for iron deficiency. -Have asked oncology to comment on ESA with HD and will start if ok  -->addendum: ok to have ESA with HD per hematology   #History of nephrolithiasis: Followed by urology   #Metabolic acidosis: resolved on HD    Subjective:   Patient feels well no complaints.  Plan is chemo then D/C today to start outpt HD Wed at [facility]. Only concern is making sure has zofran prior to chemo. HD catheter without issue.    Objective:   BP 135/82   Pulse (!) 104   Temp 97.8 F (36.6 C) (Oral)   Resp 15   Ht 5\' 10"  (1.778 m)   Wt 70.6 kg   SpO2 100%   BMI 22.33 kg/m   Intake/Output Summary (Last 24 hours) at 09/24/2021 0848 Last data filed at 09/24/2021 0500 Gross per 24 hour  Intake --  Output 1975 ml  Net -1975 ml    Weight change:   Physical Exam: General: Lying in bed in no distress  Heart: Normal rate Lungs: bilateral chest rise with no increased work of breathing Abdomen: Soft, nontender Extremities: No LE edema. Neurology: Alert, awake, following commands, no asterixis Vascular Access: Right IJ TC  Imaging: No results found.  Labs: BMET Recent Labs  Lab 09/18/21 0410 09/20/21 0225 09/21/21 0212 09/22/21 0148 09/23/21 0224 09/24/21 0143  NA 133* 136 135 134* 137 134*  K 4.9 4.0 4.0  4.1 4.2 4.1  CL 98 102 103 102 105 104  CO2 23 25 24 25 23 22   GLUCOSE 108* 94 89 95 85 94  BUN 45* 21 29* 18 25* 29*  CREATININE 6.05* 4.09* 4.80* 3.40* 4.29* 4.83*  CALCIUM 8.6* 8.2* 8.4* 7.8* 8.5* 8.6*  PHOS 6.3* 4.8* 5.5* 4.0 5.0* 5.2*    CBC Recent Labs  Lab 09/21/21 0212 09/22/21 0148 09/23/21 0224 09/24/21 0143  WBC 8.8 9.9 8.3 8.6  NEUTROABS 5.4 6.7 5.1 5.7  HGB 8.3* 8.1* 8.5* 8.2*  HCT 27.0* 25.7* 28.0* 26.8*  MCV 87.1 86.5 87.2 87.9  PLT  233 215 201 196     Medications:     acyclovir  200 mg Oral Q12H   Chlorhexidine Gluconate Cloth  6 each Topical Q0600   feeding supplement (NEPRO CARB STEADY)  237 mL Oral TID BM   folic acid  2 mg Oral Daily   Gerhardt's butt cream   Topical BID   heparin injection (subcutaneous)  5,000 Units Subcutaneous Q8H   melatonin  5 mg Oral QHS   nicotine  14 mg Transdermal Q24H   pantoprazole  40 mg Oral BID AC   tamsulosin  0.4 mg Oral Daily      Justin Mend  09/24/2021, 8:48 AM

## 2021-09-24 NOTE — Discharge Summary (Addendum)
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 male with no significant medical history, but he does not see a primary care doctor regularly presented to the ED with weakness, noted to have acute kidney injury and severe normocytic anemia   Hospital Course:   AL amyloidosis/plasma cell myeloma: Acute kidney injury -Admitted with severe AKI-creatinine of 6.5, creatinine briefly improved and then worsened again despite good urine output, followed by nephrology this admission -Amyloidosis diagnosed on renal biopsy, oncology consulted and following, bone marrow biopsy noted plasma cell myeloma, started chemo CyBorD on 9/20, second dose on 9/26, next due on Monday 10/3 -Nephrology following, previously had HD intermittently and then restarted HD 9/28, has a tunneled dialysis catheter now -Overall prognosis felt to be poor considering cardiac involvement as well, seen by palliative care, patient wishes to pursue full aggressive medical treatment at this time -[facility] -Plan to discharge  home after his dose of Cytoxan with Velcade and dexamethasone, [facility] [place] 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, with a small pericardial effusion: -Followed by cardiology, cardiac amyloidosis diagnosed on MRI, EF is low at 38% -Cardiac involvement is a poor prognostic sign with rapid progression, seen by palliative care as well, patient was wishes to proceed with aggressive medical treatment -Cardiology recommended to continue chemotherapy, they will arrange follow-up -Volume managed with HD   Chronic anemia -Multifactorial, from CKD and plasma cell myeloma -Anemia panel on 9/1 with iron deficiency -Given IV iron, consider EPO with HD   Gross hematuria/acute urinary retention due to bleeding in the collecting duct of the right kidney post renal biopsy: Foley was discontinued 08/30/2021. He required multiple transfusions. Status post embolization on 08/30/2021. -resolved   Anxiety: Continue Xanax, continue trazodone. He is not interested in other medications like SSRI.  Tobacco abuse: Continue nicotine patch.    Procedures: 08/28/2021 renal biopsy that showed amyloid disease 08/30/2021 IR performed embolization of the right inferior pole of the kidney                Right IJ -permacath                Right renal angiogram 09/05/2021 bone marrow biopsy that showed plasma cell myeloma 09/10/2021 cMRI showed possible cardiac amyloid  Consultants: Nephrology, oncology, cardiology, palliative medicine,  interventional radiology   Discharge Exam: Vitals:   09/24/21 0515 09/24/21 1337  BP: 135/82  122/75  Pulse: (!) 104 85  Resp: 15   Temp: 97.8 F (36.6 C) 97.7 F (36.5 C)  SpO2: 100% 100%   General exam: Chronically ill elderly male sitting up in bed, AAOx3, no distress HEENT: Right IJ HD catheter noted CVS: S1-S2, regular rate rhythm Lungs: Clear bilaterally Abdomen: Soft, nontender, bowel sounds present  Extremities: No edema Skin: No rash on exposed skin   Discharge Instructions   Discharge Instructions     Diet - low sodium heart healthy   Complete by: As directed    Discharge instructions   Complete by: As directed    Renal Diet   Increase activity slowly   Complete by: As directed    Increase activity slowly   Complete by: As directed    No wound care   Complete by: As directed    No wound care   Complete by: As directed       Allergies as of 09/24/2021   No Known Allergies      Medication List     STOP taking these medications    amoxicillin-clavulanate 875-125 MG tablet Commonly known as: AUGMENTIN   BC Fast Pain Relief 650-195-33.3 MG Pack Generic drug: Aspirin-Salicylamide-Caffeine       TAKE these medications    acetaminophen 325 MG tablet Commonly known as: TYLENOL Take 2 tablets (650 mg total) by mouth every 6 (six) hours as needed for moderate pain (headache).   folic acid 1 MG tablet Commonly known as: FOLVITE Take 2 tablets (2 mg total) by mouth daily. Start taking on: September 25, 2021   ondansetron 4 MG tablet Commonly known as: Zofran Take 1 tablet (4 mg total) by mouth every 6 (six) hours as needed for nausea or vomiting.   oxyCODONE 5 MG immediate release tablet Commonly known as: Oxy IR/ROXICODONE Take 1 tablet (5 mg total) by mouth every 6 (six) hours as needed for severe pain.   pantoprazole 40 MG tablet Commonly known as: PROTONIX Take 1 tablet (40 mg total) by mouth 2 (two) times daily before a meal.   prochlorperazine 5 MG tablet Commonly known as: COMPAZINE Take 1 tablet (5 mg total) by mouth every 6  (six) hours as needed for nausea or vomiting.   promethazine-dextromethorphan 6.25-15 MG/5ML syrup Commonly known as: PROMETHAZINE-DM Take 5 mLs by mouth every 4 (four) hours as needed for cough.   tamsulosin 0.4 MG Caps capsule Commonly known as: FLOMAX Take 1 capsule (0.4 mg total) by mouth daily. Start taking on: September 25, 2021       No Known Allergies  Follow-up Information     [facility] Follow [facility].   Specialty: Home Health Services Why: Registered Nurse-Office to call with visit times. Please call the office if you need anything at [phone].        [facility] Follow up on 09/26/2021.   Why: scheduled to start at [facility] on Wednesday, Oct 5.  - need to arrive at 11:45 for first appointment. Contact information: [address] [place] [address] [phone]         Truitt Merle, MD Follow up on 09/27/2021.   Specialties: Hematology, Oncology Why: At [facility] information: [place] [address] [phone]                  The results of significant diagnostics from this  hospitalization (including imaging, microbiology, ancillary and laboratory) are listed below for reference.    Significant Diagnostic Studies: CT ABDOMEN PELVIS WO CONTRAST  Result Date: 08/30/2021 CLINICAL DATA:  Retroperitoneal hematoma, follow up s/p random renal bx with perinephric hematoma development EXAM: CT ABDOMEN AND PELVIS WITHOUT CONTRAST TECHNIQUE: Multidetector CT imaging of the abdomen and pelvis was performed following the standard protocol without IV contrast. COMPARISON:  September 6 FINDINGS: Inferior chest: Trace bilateral pleural effusions with right greater than left compressive subsegmental atelectasis. Hepatobiliary: The liver is normal in size without focal abnormality. No intrahepatic or extrahepatic biliary ductal dilation. The gallbladder appears normal. Spleen: Normal in size without focal  abnormality. Pancreas: No pancreatic ductal dilatation or surrounding inflammatory changes. Adrenals/Urinary Tract: Adrenal glands are unremarkable. The kidneys are normal in size. Tiny perinephric hematoma along the right renal lower pole is unchanged. Hyperdense material in the right collecting system and bladder consistent with blood products, grossly similar. Stomach/Bowel: The stomach, small bowel and large bowel are normal in caliber without abnormal wall thickening or surrounding inflammatory changes. Reproductive: Prostate is unremarkable. Lymphatic: No enlarged lymph nodes in the abdomen or pelvis. Vasculature: The abdominal aorta is normal in caliber. Aortic atherosclerosis. Other: No abdominopelvic ascites. Musculoskeletal: No aggressive osseous lesions. Degenerative changes at L5-S1. Bone island in the left ilium. IMPRESSION: The small perinephric hematoma along the right renal lower pole is stable, and within expected limits after percutaneous biopsy. However, there remains substantial clot burden within the bladder. Follow-up urology recommendations for management. Electronically Signed   By: Albin Felling M.D.   On: 08/30/2021 14:21   CT ABDOMEN PELVIS WO CONTRAST  Result Date: 08/29/2021 CLINICAL DATA:  Post right-sided renal biopsy, now with hematuria and hypotension. EXAM: CT ABDOMEN AND PELVIS WITHOUT CONTRAST TECHNIQUE: Multidetector CT imaging of the abdomen and pelvis was performed following the standard protocol without IV contrast. COMPARISON:  CT abdomen pelvis-08/23/2021; ultrasound-guided right renal biopsy-earlier same day FINDINGS: The lack of intravenous contrast limits the ability to evaluate solid abdominal organs. Lower chest: Limited visualization of the lower thorax demonstrates interval development of trace bilateral effusion with worsening bibasilar heterogeneous/consolidative opacities, right greater than left. Previously identified nonspecific ground-glass opacities within  the imaged lung bases is not seen on the present examination though there is mild residual intraseptal thickening. Normal heart size. Trace amount of pericardial fluid, unchanged presumably physiologic. There is diffuse decreased attenuation intra cardiac blood pool suggestive of anemia. Hepatobiliary: Normal hepatic contour. Apparent high density material within the gallbladder could represent biliary sludge. No definitive gallbladder wall thickening or pericholecystic stranding on this noncontrast examination. No ascites. Pancreas: Normal noncontrast appearance of the pancreas. Spleen: Normal noncontrast appearance of the spleen. Adrenals/Urinary Tract: There is a very tiny (approximately 2.7 x 1.6 x 1.2 cm) perinephric hematoma about the inferior pole of the right kidney (axial image 44, series 3; coronal image 57, series 6), with minimal amount of adjacent perinephric stranding. High-density material is seen within the right renal collecting system and ureter with moderate to large amount of layering high-density material within urinary bladder, findings compatible with hemorrhage into the collecting system and bladder. Mild associated right-sided pelviectasis and ureterectasis. Normal noncontrast appearance of the left kidney. No evidence of left-sided nephrolithiasis or urinary obstruction. Normal noncontrast appearance of the bilateral adrenal glands. Stomach/Bowel: Scattered minimal colonic diverticulosis without evidence of superimposed acute diverticulitis on this noncontrast examination. Normal appearance of the terminal ileum. The appendix is not visualized compatible with provided operative history. No discrete areas of  bowel wall thickening on this noncontrast examination. No pneumoperitoneum, pneumatosis or portal venous gas. Vascular/Lymphatic: Moderate amount of atherosclerotic plaque within normal caliber abdominal aorta. Scattered retroperitoneal lymph nodes are numerous though individually not  enlarged by size criteria with index left sided periaortic lymph node measuring 0.7 cm in greatest short axis diameter (image 31, series 3), presumably reactive in etiology. No bulky retroperitoneal, mesenteric, pelvic or inguinal lymphadenopathy on this noncontrast examination Reproductive: Dystrophic calcifications within normal sized prostate gland. Trace amount of fluid within the pelvic cul-de-sac. Other: Small bilateral mesenteric fat containing inguinal hernias, left greater than right. Minimal amount of subcutaneous edema about the midline of the low back. Presumed shrapnel is seen within the right lower abdominal/pelvic ventral abdominal wall. Musculoskeletal: No acute or aggressive osseous abnormalities. Mild-to-moderate multilevel lumbar spine DDD, worse at L4-L5 and L5-S1 with disc space height loss, endplate irregularity and small posteriorly directed disc osteophyte complexes at these locations. Mild degenerative change the bilateral hips with joint space loss, subchondral sclerosis and osteophytosis, right greater than left. IMPRESSION: 1. Post right-sided renal biopsy complicated by tiny (approximately 2.7 cm) perinephric hematoma and bleeding into the right renal collecting system including moderate to large-sized clot within the urinary bladder and associated mild right-sided pelviectasis and ureterectasis. Consideration for initiation of continuous bladder irrigation could be performed as indicated. 2. Trace bilateral effusions with associated bibasilar opacities, right greater than left, likely atelectasis. 3. Colonic diverticulosis without evidence superimposed acute diverticulitis. 4.  Aortic Atherosclerosis (ICD10-I70.0). Critical Value/emergent results were called by telephone at the time of interpretation on 08/28/2021 at 5:04 pm to provider [facility] , who verbally acknowledged these results. Electronically Signed   By: Sandi Mariscal M.D.   On: 08/29/2021 10:38   DG Chest 2  View  Result Date: 09/06/2021 CLINICAL DATA:  Fever EXAM: CHEST - 2 VIEW COMPARISON:  09/03/2021 FINDINGS: Right IJ dialysis catheter tip med right atrium. Midline trachea. Normal heart size. Left costophrenic angle minimally excluded the frontal radiograph. Small bilateral pleural effusions. No pneumothorax. Improved interstitial edema with minimal pulmonary venous congestion remaining. Persistent mild bibasilar atelectasis. IMPRESSION: Improved interstitial edema with mild pulmonary venous congestion remaining. Small bilateral pleural effusions with adjacent atelectasis. Electronically Signed   By: Abigail Miyamoto M.D.   On: 09/06/2021 13:49   IR Angiogram Renal Left Selective  INDICATION: 70 year old male with history of acute kidney injury of uncertain etiology status post ultrasound-guided right renal biopsy on 08/28/2021. Since biopsy, the patient has experienced gross hematuria with associated acute anemia.   EXAM: 1. Ultrasound-guided vascular access of the right internal jugular vein. 2. Temporary hemodialysis catheter placement. 3. Ultrasound-guided vascular access of the right common femoral artery. 4. Selective catheterization and angiography of the right renal artery. 5. Sub selective catheterization angiography of right inferior polar and arcuate artery branches. 6. Coil embolization of right inferior pole arcuate artery branch.   MEDICATIONS: None.   ANESTHESIA/SEDATION: Moderate (conscious) sedation was employed during this procedure. A total of Versed 3 mg and Fentanyl 50 mcg was administered intravenously.   Moderate Sedation Time: 66 minutes. The patient's level of consciousness and vital signs were monitored continuously by radiology nursing throughout the procedure under my direct supervision.   CONTRAST:  89m OMNIPAQUE IOHEXOL 350 MG/ML SOLN, 515mOMNIPAQUE IOHEXOL 350 MG/ML SOLN   FLUOROSCOPY TIME:  Fluoroscopy Time: 10.6 minutes, (811 mGy).   COMPLICATIONS: None immediate.   PROCEDURE:  Informed consent was obtained from the patient following explanation of the procedure, risks, benefits and alternatives.  The patient understands, agrees and consents for the procedure. All questions were addressed. A time out was performed prior to the initiation of the procedure. Maximal barrier sterile technique utilized including caps, mask, sterile gowns, sterile gloves, large sterile drape, hand hygiene, and chlorhexidine prep.   Preprocedure ultrasound evaluation of the right internal jugular vein demonstrated a patent and compressible vein free of internal echoes. Procedure was planned. Subdermal Local anesthesia was provided 1% lidocaine. A small skin nick was made. Under direct ultrasound visualization, a 21 gauge micropuncture needle was directed into the internal jugular vein. An image was captured and stored in the permanent record. A micropuncture set was inserted and exchanged for a J wire which was positioned in the inferior vena cava. Serial dilation was performed followed by placement of a 12.5 French, 24 cm Trialysis catheter. The catheter tip was positioned in the right atrium. Each lumen flushed and aspirated appropriately. The dialysis ports were locked with appropriate volume of heparin dwell. The middle central venous port was then used for sedation for the remainder of the procedure. The catheter was secured with a 0 silk retention suture. A sterile bandage was applied.   Preprocedure ultrasound evaluation of the right groin was performed which demonstrated a patent right common femoral artery. The procedure was planned. Subdermal Local anesthesia was provided with 1% lidocaine. A small skin nick was made. Under direct ultrasound visualization, a 21 gauge micropuncture needle was directed into the common femoral artery. An ultrasound image was captured and stored in the permanent record. A micro puncture set was inserted and a limited right lower extremity angiogram was performed which  demonstrated appropriate puncture site for closure device use. A J wire was directed to the abdominal aorta and the micropuncture set was exchanged for a 5 [place] vascular sheath. A 5 French C2 catheter was then directed into the right renal ostium. Right renal angiogram was performed. The single main renal artery was patent. 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   A straight lantern microcatheter and 0.014" soft synchro wire was then inserted and directed into the inferior polar branch. Repeat angiogram was performed in the sub selective location which was significant for multifocal 2-4 mm pseudoaneurysm formation, abnormal truncation and irregularity of the arcuate and interlobular branches, an early arteriovenous shunting. The inferior polar arcuate branch was selected further. Coil embolization was performed with multiple low profile Penumbra Ruby coils ranging from 2-3 mm in diameter. Completion right inferior renal angiogram was performed which demonstrated appropriate embolization of the targeted vessels without persistent arteriovenous fistula or vessel irregularity.   The catheters were removed. The right common femoral artery was then closed with a 6 [place] Angio-Seal device. Distal pulses were unchanged. The patient tolerated the procedure well was transferred back to the floor in good condition.   IMPRESSION: 1. 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 2. Sub selective coil embolization of right inferior polar arcuate artery branch. 3. Successful placement of right internal jugular, 24 French Trialysis catheter with the catheter tip in the right  atrium. The catheter is ready for immediate use.   Ruthann Cancer, MD   [facility]   [facility]     Electronically  Signed   By: Ruthann Cancer M.D.   On: 08/31/2021 09:29    IR Fluoro Guide CV Line Right  Result Date: 09/18/2021 INDICATION: 70 year old male referred for tunneled hemodialysis catheter EXAM: TUNNELED CENTRAL VENOUS HEMODIALYSIS CATHETER PLACEMENT WITH ULTRASOUND AND FLUOROSCOPIC GUIDANCE MEDICATIONS: 2 g Ancef. The antibiotic was given in an appropriate time interval prior to skin puncture. ANESTHESIA/SEDATION: Moderate (conscious) sedation was employed during this procedure. A total of Versed 4.0 mg and Fentanyl 200 mcg was administered intravenously. Moderate Sedation Time: 20 minutes. The patient's level of consciousness and vital signs were monitored continuously by radiology nursing throughout the procedure under my direct supervision. FLUOROSCOPY TIME:  Fluoroscopy Time: 0 minutes 30 seconds (2 mGy). COMPLICATIONS: None PROCEDURE: Informed written consent was obtained from the patient after a discussion of the risks, benefits, and alternatives to treatment. Questions regarding the procedure were encouraged and answered. The right neck and chest were prepped with chlorhexidine in a sterile fashion, and a sterile drape was applied covering the operative field. Maximum barrier sterile technique with sterile gowns and gloves were used for the procedure. A timeout was performed prior to the initiation of the procedure. Ultrasound survey was performed. Micropuncture kit was utilized to access the right internal jugular vein under direct, real-time ultrasound guidance after the overlying soft tissues were anesthetized with 1% lidocaine with epinephrine. Stab incision was made with 11 blade scalpel. Microwire would not pass centrally. Once we confirmed that the tip of the needle was within the partially occluded IJ, a Nitrex wire was passed into the  right heart. The needle was removed and the micro puncture was set was advanced onto the Nitrex wire. Nitrex wire was then exchanged for the standard microwire. The microwire was then marked to measure appropriate internal catheter length. External tunneled length was estimated. A total tip to cuff length of 19 cm was selected. 035 guidewire was advanced to the level of the IVC. Skin and subcutaneous tissues of chest wall below the clavicle were generously infiltrated with 1% lidocaine for local anesthesia. A small stab incision was made with 11 blade scalpel. The selected hemodialysis catheter was tunneled in a retrograde fashion from the anterior chest wall to the venotomy incision. Serial dilation was performed and then a peel-away sheath was placed. The catheter was then placed through the peel-away sheath with tips ultimately positioned within the superior aspect of the right atrium. Final catheter positioning was confirmed and documented with a spot radiographic image. The catheter aspirates and flushes normally. The catheter was flushed with appropriate volume heparin dwells. The catheter exit site was secured with a 0-Prolene retention suture. Gel-Foam slurry was infused into the soft tissue tract. The venotomy incision was closed Derma bond and sterile dressing. Dressings were applied at the chest wall. Patient tolerated the procedure well and remained hemodynamically stable throughout. No complications were encountered and no significant blood loss encountered. IMPRESSION: Status post image guided placement of right IJ tunneled hemodialysis catheter. Signed, Dulcy Fanny. Dellia Nims, RPVI [facility] Electronically Signed   By: Corrie Mckusick D.O.   On: 09/18/2021 13:03   IR Fluoro Guide CV Line Right  Result Date: 08/31/2021 INDICATION: 70 year old male with history of acute kidney injury of uncertain etiology status post ultrasound-guided right  renal biopsy on 08/28/2021. Since biopsy, the patient has experienced gross hematuria with associated acute anemia. EXAM: 1. Ultrasound-guided vascular access of the right internal jugular vein. 2. Temporary hemodialysis catheter placement. 3. Ultrasound-guided vascular access  of the right common femoral artery. 4. Selective catheterization and angiography of the right renal artery. 5. Sub selective catheterization angiography of right inferior polar and arcuate artery branches. 6. Coil embolization of right inferior pole arcuate artery branch. MEDICATIONS: None. ANESTHESIA/SEDATION: Moderate (conscious) sedation was employed during this procedure. A total of Versed 3 mg and Fentanyl 50 mcg was administered intravenously. Moderate Sedation Time: 66 minutes. The patient's level of consciousness and vital signs were monitored continuously by radiology nursing throughout the procedure under my direct supervision. CONTRAST:  18m OMNIPAQUE IOHEXOL 350 MG/ML SOLN, 528mOMNIPAQUE IOHEXOL 350 MG/ML SOLN FLUOROSCOPY TIME:  Fluoroscopy Time: 10.6 minutes, (811 mGy). COMPLICATIONS: None immediate. PROCEDURE: Informed consent was obtained from the patient following explanation of the procedure, risks, benefits and alternatives. The patient understands, agrees and consents for the procedure. All questions were addressed. A time out was performed prior to the initiation of the procedure. Maximal barrier sterile technique utilized including caps, mask, sterile gowns, sterile gloves, large sterile drape, hand hygiene, and chlorhexidine prep. Preprocedure ultrasound evaluation of the right internal jugular vein demonstrated a patent and compressible vein free of internal echoes. Procedure was planned. Subdermal Local anesthesia was provided 1% lidocaine. A small skin nick was made. Under direct ultrasound visualization, a 21 gauge micropuncture needle was directed into the internal jugular vein. An image was captured and stored in  the permanent record. A micropuncture set was inserted and exchanged for a J wire which was positioned in the inferior vena cava. Serial dilation was performed followed by placement of a 12.5 French, 24 cm Trialysis catheter. The catheter tip was positioned in the right atrium. Each lumen flushed and aspirated appropriately. The dialysis ports were locked with appropriate volume of heparin dwell. The middle central venous port was then used for sedation for the remainder of the procedure. The catheter was secured with a 0 silk retention suture. A sterile bandage was applied. Preprocedure ultrasound evaluation of the right groin was performed which demonstrated a patent right common femoral artery. The procedure was planned. Subdermal Local anesthesia was provided with 1% lidocaine. A small skin nick was made. Under direct ultrasound visualization, a 21 gauge micropuncture needle was directed into the common femoral artery. An ultrasound image was captured and stored in the permanent record. A micro puncture set was inserted and a limited right lower extremity angiogram was performed which demonstrated appropriate puncture site for closure device use. A J wire was directed to the abdominal aorta and the micropuncture set was exchanged for a 5 [place] sheath. A 5 French C2 catheter was then directed into the right renal ostium. Right renal angiogram was performed. The single main renal artery was patent. 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 A straight lantern microcatheter and 0.014" soft synchro wire was then inserted and directed into the inferior polar branch. Repeat angiogram was performed in the sub selective location which was significant for multifocal 2-4 mm pseudoaneurysm formation,  abnormal truncation and irregularity of the arcuate and interlobular branches, an early arteriovenous shunting. The inferior polar arcuate branch was selected further. Coil embolization was performed with multiple low profile Penumbra Ruby coils ranging from 2-3 mm in diameter. Completion right inferior renal angiogram was performed which demonstrated appropriate embolization of the targeted vessels without persistent arteriovenous fistula or vessel irregularity. The  catheters were removed. The right common femoral artery was then closed with a 6 [place] Angio-Seal device. Distal pulses were unchanged. The patient tolerated the procedure well was transferred back to the floor in good condition. IMPRESSION: 1. 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 2. Sub selective coil embolization of right inferior polar arcuate artery branch. 3. Successful placement of right internal jugular, 24 French Trialysis catheter with the catheter tip in the right atrium. The catheter is ready for immediate use. Ruthann Cancer, MD [facility] Radiology Electronically Signed   By: Ruthann Cancer M.D.   On: 08/31/2021 09:29   IR US Guide Vasc Access Right  Result Date: 09/18/2021 INDICATION: 70 year old male referred for tunneled hemodialysis catheter EXAM: TUNNELED CENTRAL VENOUS HEMODIALYSIS CATHETER PLACEMENT WITH ULTRASOUND AND FLUOROSCOPIC GUIDANCE MEDICATIONS: 2 g Ancef. The antibiotic was given in an appropriate time interval prior to skin puncture. ANESTHESIA/SEDATION: Moderate (conscious) sedation was employed during this procedure. A total of Versed 4.0 mg and Fentanyl 200 mcg was administered intravenously. Moderate Sedation Time: 20 minutes. The patient's level of consciousness and vital signs were monitored continuously by radiology  nursing throughout the procedure under my direct supervision. FLUOROSCOPY TIME:  Fluoroscopy Time: 0 minutes 30 seconds (2 mGy). COMPLICATIONS: None PROCEDURE: Informed written consent was obtained from the patient after a discussion of the risks, benefits, and alternatives to treatment. Questions regarding the procedure were encouraged and answered. The right neck and chest were prepped with chlorhexidine in a sterile fashion, and a sterile drape was applied covering the operative field. Maximum barrier sterile technique with sterile gowns and gloves were used for the procedure. A timeout was performed prior to the initiation of the procedure. Ultrasound survey was performed. Micropuncture kit was utilized to access the right internal jugular vein under direct, real-time ultrasound guidance after the overlying soft tissues were anesthetized with 1% lidocaine with epinephrine. Stab incision was made with 11 blade scalpel. Microwire would not pass centrally. Once we confirmed that the tip of the needle was within the partially occluded IJ, a Nitrex wire was passed into the right heart. The needle was removed and the micro puncture was set was advanced onto the Nitrex wire. Nitrex wire was then exchanged for the standard microwire. The microwire was then marked to measure appropriate internal catheter length. External tunneled length was estimated. A total tip to cuff length of 19 cm was selected. 035 guidewire was advanced to the level of the IVC. Skin and subcutaneous tissues of chest wall below the clavicle were generously infiltrated with 1% lidocaine for local anesthesia. A small stab incision was made with 11 blade scalpel. The selected hemodialysis catheter was tunneled in a retrograde fashion from the anterior chest wall to the venotomy incision. Serial dilation was performed and then a peel-away sheath was placed. The catheter was then placed through the peel-away sheath with tips ultimately positioned within  the superior aspect of the right atrium. Final catheter positioning was confirmed and documented with a spot radiographic image. The catheter aspirates and flushes normally. The catheter was flushed with appropriate volume heparin dwells. The catheter exit site was secured with a 0-Prolene retention suture. Gel-Foam slurry was infused into the soft tissue tract. The venotomy incision was closed Derma bond and sterile dressing. Dressings were applied at the chest wall. Patient tolerated the procedure well and remained hemodynamically stable throughout. No complications  were encountered and no significant blood loss encountered. IMPRESSION: Status post image guided placement of right IJ tunneled hemodialysis catheter. Signed, Dulcy Fanny. Dellia Nims, RPVI [facility] Inter[facility] Radiology Special[facility] Radiology Electronically Signed   By: Corrie Mckusick D.O.   On: 09/18/2021 13:03   IR US Guide Vasc Access Right  Result Date: 08/31/2021 INDICATION: 70 year old male with history of acute kidney injury of uncertain etiology status post ultrasound-guided right renal biopsy on 08/28/2021. Since biopsy, the patient has experienced gross hematuria with associated acute anemia. EXAM: 1. Ultrasound-guided vascular access of the right internal jugular vein. 2. Temporary hemodialysis catheter placement. 3. Ultrasound-guided vascular access of the right common femoral artery. 4. Selective catheterization and angiography of the right renal artery. 5. Sub selective catheterization angiography of right inferior polar and arcuate artery branches. 6. Coil embolization of right inferior pole arcuate artery branch. MEDICATIONS: None. ANESTHESIA/SEDATION: Moderate (conscious) sedation was employed during this procedure. A total of Versed 3 mg and Fentanyl 50 mcg was administered intravenously. Moderate Sedation Time: 66 minutes. The patient's level of consciousness and vital signs were monitored continuously by radiology  nursing throughout the procedure under my direct supervision. CONTRAST:  59m OMNIPAQUE IOHEXOL 350 MG/ML SOLN, 54mOMNIPAQUE IOHEXOL 350 MG/ML SOLN FLUOROSCOPY TIME:  Fluoroscopy Time: 10.6 minutes, (811 mGy). COMPLICATIONS: None immediate. PROCEDURE: Informed consent was obtained from the patient following explanation of the procedure, risks, benefits and alternatives. The patient understands, agrees and consents for the procedure. All questions were addressed. A time out was performed prior to the initiation of the procedure. Maximal barrier sterile technique utilized including caps, mask, sterile gowns, sterile gloves, large sterile drape, hand hygiene, and chlorhexidine prep. Preprocedure ultrasound evaluation of the right internal jugular vein demonstrated a patent and compressible vein free of internal echoes. Procedure was planned. Subdermal Local anesthesia was provided 1% lidocaine. A small skin nick was made. Under direct ultrasound visualization, a 21 gauge micropuncture needle was directed into the internal jugular vein. An image was captured and stored in the permanent record. A micropuncture set was inserted and exchanged for a J wire which was positioned in the inferior vena cava. Serial dilation was performed followed by placement of a 12.5 French, 24 cm Trialysis catheter. The catheter tip was positioned in the right atrium. Each lumen flushed and aspirated appropriately. The dialysis ports were locked with appropriate volume of heparin dwell. The middle central venous port was then used for sedation for the remainder of the procedure. The catheter was secured with a 0 silk retention suture. A sterile bandage was applied. Preprocedure ultrasound evaluation of the right groin was performed which demonstrated a patent right common femoral artery. The procedure was planned. Subdermal Local anesthesia was provided with 1% lidocaine. A small skin nick was made. Under direct ultrasound visualization, a  21 gauge micropuncture needle was directed into the common femoral artery. An ultrasound image was captured and stored in the permanent record. A micro puncture set was inserted and a limited right lower extremity angiogram was performed which demonstrated appropriate puncture site for closure device use. A J wire was directed to the abdominal aorta and the micropuncture set was exchanged for a 5 Fr[place] sheath. A 5 French C2 catheter was then directed into the right renal ostium. Right renal angiogram was performed. The single main renal artery was patent. About 2 arcuate artery branches in the inferior pole there is abnormal truncation and vessel irregularity with evidence of an early filling arteriovenous fistula in addition  to suggestion of faint filling into the collecting system on delayed imaging. A straight lantern microcatheter and 0.014" soft synchro wire was then inserted and directed into the inferior polar branch. Repeat angiogram was performed in the sub selective location which was significant for multifocal 2-4 mm pseudoaneurysm formation, abnormal truncation and irregularity of the arcuate and interlobular branches, an early arteriovenous shunting. The inferior polar arcuate branch was selected further. Coil embolization was performed with multiple low profile Penumbra Ruby coils ranging from 2-3 mm in diameter. Completion right inferior renal angiogram was performed which demonstrated appropriate embolization of the targeted vessels without persistent arteriovenous fistula or vessel irregularity. The catheters were removed. The right common femoral artery was then closed with a 6 [place] Angio-Seal device. Distal pulses were unchanged. The patient tolerated the procedure well was transferred back to the floor in good condition. IMPRESSION: 1. 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 2. Sub selective coil embolization of right inferior polar arcuate artery branch. 3. Successful placement of right internal jugular, 24 French Trialysis catheter with the catheter tip in the right atrium. The catheter is ready for immediate use. Ruthann Cancer, MD [facility] Inter[facility] Radiology Specialists [facility] Radiology Electronically Signed   By: Ruthann Cancer M.D.   On: 08/31/2021 09:29   IR US Guide Vasc Access Right  Result Date: 08/31/2021 INDICATION: 70 year old male with history of acute kidney injury of uncertain etiology status post ultrasound-guided right renal biopsy on 08/28/2021. Since biopsy, the patient has experienced gross hematuria with associated acute anemia. EXAM: 1. Ultrasound-guided vascular access of the right internal jugular vein. 2. Temporary hemodialysis catheter placement. 3. Ultrasound-guided vascular access of the right common femoral artery. 4. Selective catheterization and angiography of the right renal artery. 5. Sub selective catheterization angiography of right inferior polar and arcuate artery branches. 6. Coil embolization of right inferior pole arcuate artery branch. MEDICATIONS: None. ANESTHESIA/SEDATION: Moderate (conscious) sedation was employed during this procedure. A total of Versed 3 mg and Fentanyl 50 mcg was administered intravenously. Moderate Sedation Time: 66 minutes. The patient's level of consciousness and vital signs were monitored continuously by radiology nursing throughout the procedure under my direct supervision. CONTRAST:  22m OMNIPAQUE IOHEXOL 350 MG/ML SOLN, 565mOMNIPAQUE IOHEXOL 350 MG/ML SOLN FLUOROSCOPY TIME:  Fluoroscopy Time: 10.6 minutes, (811 mGy). COMPLICATIONS: None immediate. PROCEDURE: Informed consent was obtained from the patient following explanation of the procedure, risks, benefits and alternatives. The patient understands, agrees and consents for the procedure. All questions were  addressed. A time out was performed prior to the initiation of the procedure. Maximal barrier sterile technique utilized including caps, mask, sterile gowns, sterile gloves, large sterile drape, hand hygiene, and chlorhexidine prep. Preprocedure ultrasound evaluation of the right internal jugular vein demonstrated a patent and compressible vein free of internal echoes. Procedure was planned. Subdermal Local anesthesia was provided 1% lidocaine. A small skin nick was made. Under direct ultrasound visualization, a 21 gauge micropuncture needle was directed into the internal jugular vein. An image was captured and stored in the permanent record. A micropuncture set was inserted and exchanged for a J wire which was positioned in the inferior vena cava. Serial dilation was performed followed by placement of a 12.5 French, 24 cm Trialysis catheter. The catheter tip was positioned in the right atrium. Each lumen flushed and aspirated appropriately. The dialysis ports were locked with appropriate volume of heparin dwell.  The middle central venous port was then used for sedation for the remainder of the procedure. The catheter was secured with a 0 silk retention suture. A sterile bandage was applied. Preprocedure ultrasound evaluation of the right groin was performed which demonstrated a patent right common femoral artery. The procedure was planned. Subdermal Local anesthesia was provided with 1% lidocaine. A small skin nick was made. Under direct ultrasound visualization, a 21 gauge micropuncture needle was directed into the common femoral artery. An ultrasound image was captured and stored in the permanent record. A micro puncture set was inserted and a limited right lower extremity angiogram was performed which demonstrated appropriate puncture site for closure device use. A J wire was directed to the abdominal aorta and the micropuncture set was exchanged for a 5 [place] vascular sheath. A 5 French C2 catheter was then  directed into the right renal ostium. Right renal angiogram was performed. The single main renal artery was patent. 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 A straight lantern microcatheter and 0.014" soft synchro wire was then inserted and directed into the inferior polar branch. Repeat angiogram was performed in the sub selective location which was significant for multifocal 2-4 mm pseudoaneurysm formation, abnormal truncation and irregularity of the arcuate and interlobular branches, an early arteriovenous shunting. The inferior polar arcuate branch was selected further. Coil embolization was performed with multiple low profile Penumbra Ruby coils ranging from 2-3 mm in diameter. Completion right inferior renal angiogram was performed which demonstrated appropriate embolization of the targeted vessels without persistent arteriovenous fistula or vessel irregularity. The catheters were removed. The right common femoral artery was then closed with a 6 [place] Angio-Seal device. Distal pulses were unchanged. The patient tolerated the procedure well was transferred back to the floor in good condition. IMPRESSION: 1. 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 2. Sub selective coil embolization of right inferior polar arcuate artery branch. 3. Successful placement of right internal jugular, 24 French Trialysis catheter with the catheter tip in the right atrium. The catheter is ready for immediate use. Ruthann Cancer, MD [facility] Inter[facility] Radiology Specialists [facility] Radiology Electronically Signed   By: Ruthann Cancer M.D.   On: 08/31/2021 09:29   DG Chest  Port 1 View  Result Date: 09/03/2021 CLINICAL DATA:  Shortness of breath EXAM: PORTABLE CHEST 1 VIEW COMPARISON:  08/23/2021 FINDINGS: Right dialysis catheter in place with the tip in the right atrium. Heart is normal size. Mild vascular congestion and interstitial prominence throughout the lungs, likely mild edema. No effusions or acute bony abnormality. IMPRESSION: Suspect mild pulmonary edema. Electronically Signed   By: Rolm Baptise M.D.   On: 09/03/2021 09:40   DG Bone Survey Met  Result Date: 09/08/2021 CLINICAL DATA:  Left hip pain. EXAM: METASTATIC BONE SURVEY COMPARISON:  None. FINDINGS: No metastatic lesions identified. No cause for left hip pain identified. IMPRESSION: No acute abnormalities identified. No metastatic lesions. No cause for hip pain noted. Electronically Signed   By: Dorise Bullion III M.D.   On: 09/08/2021 14:32   MR CARDIAC MORPHOLOGY WO CONTRAST  Result Date: 09/10/2021 CLINICAL DATA:  Concern for cardiac amyloidosis. EXAM: CARDIAC MRI TECHNIQUE: The patient was scanned on a 1.5 Tesla  GE magnet. A dedicated cardiac coil was used. Functional imaging was done using Fiesta sequences. 2,3, and 4 chamber views were done to assess for RWMA's. Modified Simpson's rule using a short axis stack was used to calculate an ejection fraction on a dedicated work Conservation officer, nature. T1 and T2 sequences done. No contrast due to AKI and unstable renal function. CONTRAST:  None FINDINGS: Limited images of the lung fields showed small bilateral pleural effusions. Small inferior pericardial effusion. Normal left ventricular size with moderate LV hypertrophy. Moderate diffuse hypokinesis with EF 38%. Normal right ventricular size with mild-moderate dysfunction, EF 36%. The aortic valve is trileaflet with no significant stenosis or regurgitation. Normal right atrial size. Mild left atrial enlargement. No significant mitral regurgitation. Measurements: T1 1217 T2 53 LVEDV 134 mL LVSV 51 mL  LVEF 38% RVEDV 98 mL RVSV 35 mL RVEF 36% IMPRESSION: 1. Normal LV size with moderate LV hypertrophy. EF 38% with diffuse hypokinesis. 2.  Normal RV size with EF 36%. 3.  T1 elevated, T2 borderline elevated. 4.  Small inferior pericardial effusion. 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 Dalton Mclean Electronically Signed   By: Loralie Champagne M.D.   On: 09/10/2021 17:55   CT BONE MARROW BIOPSY & ASPIRATION  Result Date: 09/05/2021 CLINICAL DATA:  Amyloid kidney EXAM: CT GUIDED DEEP ILIAC BONE ASPIRATION AND CORE BIOPSY TECHNIQUE: Patient was placed prone on the CT gantry and limited axial scans through the pelvis were obtained. Appropriate skin entry site was identified. Skin site was marked, prepped with chlorhexidine, draped in usual sterile fashion, and infiltrated locally with 1% lidocaine. Intravenous Fentanyl 67mg and Versed 218mwere administered as conscious sedation during continuous monitoring of the patient's level of consciousness and physiological / cardiorespiratory status by the radiology RN, with a total moderate sedation time of 10 minutes. Under CT fluoroscopic guidance an 11-gauge Cook trocar bone needle was advanced into the right iliac bone just lateral to the sacroiliac joint. Once needle tip position was confirmed, core and aspiration samples were obtained, submitted to pathology for approval. Post procedure scans show no hematoma or fracture. Patient tolerated procedure well. COMPLICATIONS: COMPLICATIONS none IMPRESSION: 1. Technically successful CT guided right iliac bone core and aspiration biopsy. Electronically Signed   By: D Lucrezia Europe.D.   On: 09/05/2021 10:48   ECHOCARDIOGRAM COMPLETE  Result Date: 09/08/2021    ECHOCARDIOGRAM REPORT   Patient Name:   ALHAYES CZAJAate of Exam: 09/08/2021 Medical Rec #:  [id number]  Height:       70.0 in  Accession #:    [id number] Weight:       156.5 lb Date of Birth:  7/Nov 19, 1951   BSA:          1.881 m Patient Age:    [age]     BP:           130/78 mmHg Patient Gender: M            HR:           75 bpm. Exam Location:  Inpatient Procedure: 2D Echo, Cardiac Doppler and Color Doppler Indications:    Bacteremia R78.81  History:        Patient has prior history of Echocardiogram examinations, most  recent 08/24/2021. Risk Factors:Current Smoker. Generalized                 weakness/shortness of breath-found to have AKI and severe                 normocytic anemia.  Sonographer:    Alvino Chapel RCS Referring Phys: [id number] Arm[place]  1. Left ventricular ejection fraction, by estimation, is 45 to 50%. The left ventricle has mildly decreased function. The left ventricle demonstrates global hypokinesis. There is moderate concentric left ventricular hypertrophy. Left ventricular diastolic parameters are consistent with Grade II diastolic dysfunction (pseudonormalization).  2. Right ventricular systolic function is normal. The right ventricular size is normal. Tricuspid regurgitation signal is inadequate for assessing PA pressure.  3. Left atrial size was moderately dilated.  4. A small to moderate pericardial effusion is present. The pericardial effusion is anterior to the right ventricle. There is no evidence of cardiac tamponade.  5. The mitral valve is grossly normal. Mild mitral valve regurgitation.  6. The aortic valve is tricuspid. There is mild thickening of the aortic valve. Aortic valve regurgitation is not visualized.  7. The inferior vena cava is normal in size with greater than 50% respiratory variability, suggesting right atrial pressure of 3 mmHg.  8. No obvious valvular vegetations. Comparison(s): Prior images reviewed side by side. LVEF mildly reduced and pericardial effusion is new. FINDINGS  Left Ventricle: Left ventricular ejection fraction, by estimation, is 45 to  50%. The left ventricle has mildly decreased function. The left ventricle demonstrates global hypokinesis. The left ventricular internal cavity size was normal in size. There is  moderate concentric left ventricular hypertrophy. Left ventricular diastolic parameters are consistent with Grade II diastolic dysfunction (pseudonormalization). Right Ventricle: The right ventricular size is normal. No increase in right ventricular wall thickness. Right ventricular systolic function is normal. Tricuspid regurgitation signal is inadequate for assessing PA pressure. Left Atrium: Left atrial size was moderately dilated. Right Atrium: Right atrial size was normal in size. Pericardium: A small pericardial effusion is present. The pericardial effusion is anterior to the right ventricle. There is no evidence of cardiac tamponade. Mitral Valve: The mitral valve is grossly normal. There is mild thickening of the mitral valve leaflet(s). Mild mitral annular calcification. Mild mitral valve regurgitation. Tricuspid Valve: The tricuspid valve is grossly normal. Tricuspid valve regurgitation is trivial. Aortic Valve: The aortic valve is tricuspid. There is mild thickening of the aortic valve. There is mild to moderate aortic valve annular calcification. Aortic valve regurgitation is not visualized. Pulmonic Valve: The pulmonic valve was grossly normal. Pulmonic valve regurgitation is trivial. Aorta: The aortic root is normal in size and structure. Venous: The inferior vena cava is normal in size with greater than 50% respiratory variability, suggesting right atrial pressure of 3 mmHg. IAS/Shunts: No atrial level shunt detected by color flow Doppler.  LEFT VENTRICLE PLAX 2D LVIDd:         5.10 cm  Diastology LVIDs:         3.90 cm  LV e' medial:    3.77 cm/s LV PW:         1.50 cm  LV E/e' medial:  22.9 LV IVS:        1.40 cm  LV e' lateral:   7.18 cm/s LVOT diam:     2.00 cm  LV E/e' lateral: 12.0 LV SV:         51 LV SV Index:   27  LVOT Area:  3.14 cm  RIGHT VENTRICLE RV S prime:     14.40 cm/s TAPSE (M-mode): 1.9 cm LEFT ATRIUM              Index       RIGHT ATRIUM           Index LA diam:        4.20 cm  2.23 cm/m  RA Area:     14.50 cm LA Vol (A2C):   104.0 ml 55.29 ml/m RA Volume:   36.00 ml  19.14 ml/m LA Vol (A4C):   64.4 ml  34.24 ml/m LA Biplane Vol: 83.7 ml  44.50 ml/m  AORTIC VALVE LVOT Vmax:   89.00 cm/s LVOT Vmean:  53.600 cm/s LVOT VTI:    0.161 m  AORTA Ao Root diam: 3.50 cm MITRAL VALVE MV Area (PHT): 3.65 cm    SHUNTS MV Decel Time: 208 msec    Systemic VTI:  0.16 m MV E velocity: 86.40 cm/s  Systemic Diam: 2.00 cm MV A velocity: 63.70 cm/s MV E/A ratio:  1.36 Rozann Lesches MD Electronically signed by Rozann Lesches MD Signature Date/Time: 09/08/2021/5:26:36 PM    Final    US BIOPSY (KIDNEY)  Result Date: 08/28/2021 INDICATION: Acute kidney injury of uncertain etiology. Please perform image guided biopsy for tissue diagnostic purposes. EXAM: ULTRASOUND GUIDED RENAL BIOPSY COMPARISON:  CT abdomen and pelvis-08/23/2021 MEDICATIONS: None. ANESTHESIA/SEDATION: Fentanyl 100 mcg IV; Versed 2 mg IV Total Moderate Sedation time: 14 minutes; The patient was continuously monitored during the procedure by the interventional radiology nurse under my direct supervision. COMPLICATIONS: None immediate. PROCEDURE: Informed written consent was obtained from the patient after a discussion of the risks, benefits and alternatives to treatment. The patient understands and consents the procedure. A timeout was performed prior to the initiation of the procedure. Ultrasound scanning was performed of the bilateral flanks. The inferior pole of the right kidney was selected for biopsy due to location and sonographic window. The procedure was planned. The operative site was prepped and draped in the usual sterile fashion. The overlying soft tissues were anesthetized with 1% lidocaine with epinephrine. A 17 gauge core needle biopsy device  was advanced into the inferior cortex of the right kidney and 3 core biopsies were obtained under direct ultrasound guidance. Images were saved for documentation purposes. The biopsy device was removed and hemostasis was obtained with manual compression. Post procedural scanning was negative for significant post procedural hemorrhage or additional complication. A dressing was placed. The patient tolerated the procedure well without immediate post procedural complication. IMPRESSION: Technically successful ultrasound guided right renal biopsy. Electronically Signed   By: Sandi Mariscal M.D.   On: 08/28/2021 12:52   IR EMBO ART  VEN HEMORR LYMPH EXTRAV  INC GUIDE ROADMAPPING  Result Date: 08/31/2021 INDICATION: 70 year old male with history of acute kidney injury of uncertain etiology status post ultrasound-guided right renal biopsy on 08/28/2021. Since biopsy, the patient has experienced gross hematuria with associated acute anemia. EXAM: 1. Ultrasound-guided vascular access of the right internal jugular vein. 2. Temporary hemodialysis catheter placement. 3. Ultrasound-guided vascular access of the right common femoral artery. 4. Selective catheterization and angiography of the right renal artery. 5. Sub selective catheterization angiography of right inferior polar and arcuate artery branches. 6. Coil embolization of right inferior pole arcuate artery branch. MEDICATIONS: None. ANESTHESIA/SEDATION: Moderate (conscious) sedation was employed during this procedure. A total of Versed 3 mg and Fentanyl 50 mcg was administered intravenously. Moderate Sedation Time: 66 minutes. The  patient's level of consciousness and vital signs were monitored continuously by radiology nursing throughout the procedure under my direct supervision. CONTRAST:  16m OMNIPAQUE IOHEXOL 350 MG/ML SOLN, 587mOMNIPAQUE IOHEXOL 350 MG/ML SOLN FLUOROSCOPY TIME:  Fluoroscopy Time: 10.6 minutes, (811 mGy). COMPLICATIONS: None immediate. PROCEDURE:  Informed consent was obtained from the patient following explanation of the procedure, risks, benefits and alternatives. The patient understands, agrees and consents for the procedure. All questions were addressed. A time out was performed prior to the initiation of the procedure. Maximal barrier sterile technique utilized including caps, mask, sterile gowns, sterile gloves, large sterile drape, hand hygiene, and chlorhexidine prep. Preprocedure ultrasound evaluation of the right internal jugular vein demonstrated a patent and compressible vein free of internal echoes. Procedure was planned. Subdermal Local anesthesia was provided 1% lidocaine. A small skin nick was made. Under direct ultrasound visualization, a 21 gauge micropuncture needle was directed into the internal jugular vein. An image was captured and stored in the permanent record. A micropuncture set was inserted and exchanged for a J wire which was positioned in the inferior vena cava. Serial dilation was performed followed by placement of a 12.5 French, 24 cm Trialysis catheter. The catheter tip was positioned in the right atrium. Each lumen flushed and aspirated appropriately. The dialysis ports were locked with appropriate volume of heparin dwell. The middle central venous port was then used for sedation for the remainder of the procedure. The catheter was secured with a 0 silk retention suture. A sterile bandage was applied. Preprocedure ultrasound evaluation of the right groin was performed which demonstrated a patent right common femoral artery. The procedure was planned. Subdermal Local anesthesia was provided with 1% lidocaine. A small skin nick was made. Under direct ultrasound visualization, a 21 gauge micropuncture needle was directed into the common femoral artery. An ultrasound image was captured and stored in the permanent record. A micro puncture set was inserted and a limited right lower extremity angiogram was performed which  demonstrated appropriate puncture site for closure device use. A J wire was directed to the abdominal aorta and the micropuncture set was exchanged for a 5 Fr[place] sheath. A 5 French C2 catheter was then directed into the right renal ostium. Right renal angiogram was performed. The single main renal artery was patent. 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 A straight lantern microcatheter and 0.014" soft synchro wire was then inserted and directed into the inferior polar branch. Repeat angiogram was performed in the sub selective location which was significant for multifocal 2-4 mm pseudoaneurysm formation, abnormal truncation and irregularity of the arcuate and interlobular branches, an early arteriovenous shunting. The inferior polar arcuate branch was selected further. Coil embolization was performed with multiple low profile Penumbra Ruby coils ranging from 2-3 mm in diameter. Completion right inferior renal angiogram was performed which demonstrated appropriate embolization of the targeted vessels without persistent arteriovenous fistula or vessel irregularity. The catheters were removed. The right common femoral artery was then closed with a 6 FrPakistanngio-Seal device. Distal pulses were unchanged. The patient tolerated the procedure well was transferred back to the floor in good condition. IMPRESSION: 1. 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 2. Sub selective coil embolization of  right inferior polar arcuate artery branch. 3. Successful placement of right internal jugular, 24 French Trialysis catheter with the catheter tip in the right atrium.  The catheter is ready for immediate use. Ruthann Cancer, MD [facility] Electronically Signed   By: Ruthann Cancer M.D.   On: 08/31/2021 09:29   VAS [place] UPPER EXT VEIN MAPPING (PRE-OP AVF)  Result Date: 09/20/2021 UPPER EXTREMITY VEIN MAPPING Patient Name:  NICHOLLAS PERUSSE  Date of Exam:   09/20/2021 Medical Rec #: [id number]     Accession #:    [id number] Date of Birth: [birth year]      Patient Gender: M Patient Age:   70 years Exam Location:  [facility] Procedure:      VAS [place] UPPER EXT VEIN MAPPING (PRE-OP AVF) Referring Phys: Otelia Santee --------------------------------------------------------------------------------  Indications: Pre-access. Comparison Study: No prior studies. Performing Technologist: Darlin Coco RDMS, RVT  Examination Guidelines: A complete evaluation includes B-mode imaging, spectral Doppler, color Doppler, and power Doppler as needed of all accessible portions of each vessel. Bilateral testing is considered an integral part of a complete examination. Limited examinations for reoccurring indications may be performed as noted. +-----------------+-------------+----------+---------+ Right Cephalic   Diameter (cm)Depth (cm)Findings  +-----------------+-------------+----------+---------+ Shoulder             0.33        0.97             +-----------------+-------------+----------+---------+ Prox upper arm       0.36        0.75             +-----------------+-------------+----------+---------+ Mid upper arm        0.27        0.45   branching +-----------------+-------------+----------+---------+ Dist upper arm       0.23        0.20   branching +-----------------+-------------+----------+---------+ Antecubital fossa    0.23        0.17             +-----------------+-------------+----------+---------+ Prox forearm         0.21        0.24   branching +-----------------+-------------+----------+---------+  Mid forearm          0.25        0.27             +-----------------+-------------+----------+---------+ Dist forearm         0.19        0.32             +-----------------+-------------+----------+---------+ Wrist                0.20        0.29             +-----------------+-------------+----------+---------+ +-----------------+-------------+----------+---------+ Left Cephalic    Diameter (cm)Depth (cm)Findings  +-----------------+-------------+----------+---------+ Shoulder             0.25        1.23             +-----------------+-------------+----------+---------+ Prox upper arm       0.31        0.88             +-----------------+-------------+----------+---------+ Mid upper arm        0.37        0.52             +-----------------+-------------+----------+---------+ Dist upper arm     0.30/0.32  0.49/0.78 branching +-----------------+-------------+----------+---------+  Antecubital fossa    0.25        0.24   branching +-----------------+-------------+----------+---------+ Prox forearm         0.29        0.23             +-----------------+-------------+----------+---------+ Mid forearm        0.26/0.41  0.34/0.26 branching +-----------------+-------------+----------+---------+ Dist forearm         0.30        0.12             +-----------------+-------------+----------+---------+ Wrist                0.33        0.10             +-----------------+-------------+----------+---------+ *See table(s) above for measurements and observations.  Diagnosing physician: Orlie Pollen Electronically signed by Orlie Pollen on 09/20/2021 at 3:02:46 PM.    Final     Microbiology: No results found for this or any previous visit (from the past 240 hour(s)).   Labs: Basic Metabolic Panel: Recent Labs  Lab 09/20/21 0225 09/21/21 0212 09/22/21 0148 09/23/21 0224 09/24/21 0143  NA 136 135 134* 137 134*  K 4.0 4.0 4.1 4.2 4.1  CL 102 103 102  105 104  CO2 25 24 25 23 22   GLUCOSE 94 89 95 85 94  BUN 21 29* 18 25* 29*  CREATININE 4.09* 4.80* 3.40* 4.29* 4.83*  CALCIUM 8.2* 8.4* 7.8* 8.5* 8.6*  MG  --   --   --  2.1  --   PHOS 4.8* 5.5* 4.0 5.0* 5.2*   Liver Function Tests: Recent Labs  Lab 09/20/21 0225 09/21/21 0212 09/22/21 0148 09/23/21 0224 09/24/21 0143  ALBUMIN <1.5* 1.5* 1.5* 1.6* 1.6*   No results for input(s): LIPASE, AMYLASE in the last 168 hours. No results for input(s): AMMONIA in the last 168 hours. CBC: Recent Labs  Lab 09/20/21 0225 09/21/21 0212 09/22/21 0148 09/23/21 0224 09/24/21 0143  WBC 9.2 8.8 9.9 8.3 8.6  NEUTROABS 5.9 5.4 6.7 5.1 5.7  HGB 8.3* 8.3* 8.1* 8.5* 8.2*  HCT 26.9* 27.0* 25.7* 28.0* 26.8*  MCV 86.2 87.1 86.5 87.2 87.9  PLT 254 233 215 201 196   Cardiac Enzymes: No results for input(s): CKTOTAL, CKMB, CKMBINDEX, TROPONINI in the last 168 hours. BNP: BNP (last 3 results) Recent Labs    08/23/21 1901 09/09/21 1514  BNP 1,300.0* 2,567.7*    ProBNP (last 3 results) No results for input(s): PROBNP in the last 8760 hours.  CBG: No results for input(s): GLUCAP in the last 168 hours.  Signed:  Domenic Polite MD.  [facility] 09/24/2021, 3:27 PM

## 2021-09-24 NOTE — Progress Notes (Addendum)
Ok to proceed with Cytoxan and Velcade today with elevated HR and Scr and with last CMET on 08/24/21.  Raul Del Stanfield, Marceline, BCPS, BCOP 09/24/2021 11:45 AM

## 2021-09-24 NOTE — Care Management Important Message (Signed)
Important Message  Patient Details  Name: Caleb Simmons MRN: [id number] Date of Birth: 02/18/1951   Medicare Important Message Given:  Yes     Shelda Altes 09/24/2021, 11:39 AM

## 2021-09-25 ENCOUNTER — Telehealth (HOSPITAL_COMMUNITY): Payer: Self-pay | Admitting: Nephrology

## 2021-09-25 NOTE — Telephone Encounter (Signed)
Transition of care contact from inpatient facility  Date of Discharge: 10/3 Date of Contact:10/4 - attempted Method of contact: Phone  Attempted to contact patient to discuss transition of care from inpatient admission. Patient did not answer the phone and there was no ability to leave a message.  Veneta Penton, PA-C [employer] Pager [phone]

## 2021-09-26 ENCOUNTER — Other Ambulatory Visit: Payer: Commercial Managed Care - PPO

## 2021-09-26 ENCOUNTER — Ambulatory Visit: Payer: Commercial Managed Care - PPO

## 2021-09-26 ENCOUNTER — Other Ambulatory Visit: Payer: Self-pay | Admitting: Hematology

## 2021-09-26 ENCOUNTER — Ambulatory Visit: Payer: Commercial Managed Care - PPO | Admitting: Hematology

## 2021-09-27 ENCOUNTER — Other Ambulatory Visit: Payer: Self-pay

## 2021-09-27 ENCOUNTER — Inpatient Hospital Stay: Payer: Commercial Managed Care - PPO | Attending: Hematology

## 2021-09-27 ENCOUNTER — Telehealth: Payer: Self-pay

## 2021-09-27 ENCOUNTER — Inpatient Hospital Stay (HOSPITAL_BASED_OUTPATIENT_CLINIC_OR_DEPARTMENT_OTHER): Payer: Commercial Managed Care - PPO | Admitting: Hematology

## 2021-09-27 ENCOUNTER — Inpatient Hospital Stay: Payer: Commercial Managed Care - PPO

## 2021-09-27 ENCOUNTER — Encounter: Payer: Self-pay | Admitting: Hematology

## 2021-09-27 VITALS — BP 86/52 | HR 100 | Temp 97.9°F | Resp 16

## 2021-09-27 DIAGNOSIS — C9000 Multiple myeloma not having achieved remission: Secondary | ICD-10-CM | POA: Diagnosis present

## 2021-09-27 DIAGNOSIS — N186 End stage renal disease: Secondary | ICD-10-CM | POA: Diagnosis not present

## 2021-09-27 DIAGNOSIS — Z992 Dependence on renal dialysis: Secondary | ICD-10-CM | POA: Diagnosis not present

## 2021-09-27 DIAGNOSIS — I509 Heart failure, unspecified: Secondary | ICD-10-CM | POA: Diagnosis not present

## 2021-09-27 DIAGNOSIS — Z5112 Encounter for antineoplastic immunotherapy: Secondary | ICD-10-CM | POA: Diagnosis not present

## 2021-09-27 DIAGNOSIS — I951 Orthostatic hypotension: Secondary | ICD-10-CM | POA: Insufficient documentation

## 2021-09-27 DIAGNOSIS — E8581 Light chain (AL) amyloidosis: Secondary | ICD-10-CM

## 2021-09-27 DIAGNOSIS — D638 Anemia in other chronic diseases classified elsewhere: Secondary | ICD-10-CM | POA: Insufficient documentation

## 2021-09-27 LAB — CBC WITH DIFFERENTIAL (CANCER CENTER ONLY)
Abs Immature Granulocytes: 0.04 10*3/uL (ref 0.00–0.07)
Basophils Absolute: 0.1 10*3/uL (ref 0.0–0.1)
Basophils Relative: 1 %
Eosinophils Absolute: 0.1 10*3/uL (ref 0.0–0.5)
Eosinophils Relative: 1 %
HCT: 28.1 % — ABNORMAL LOW (ref 39.0–52.0)
Hemoglobin: 9.1 g/dL — ABNORMAL LOW (ref 13.0–17.0)
Immature Granulocytes: 0 %
Lymphocytes Relative: 21 %
Lymphs Abs: 2 10*3/uL (ref 0.7–4.0)
MCH: 27.2 pg (ref 26.0–34.0)
MCHC: 32.4 g/dL (ref 30.0–36.0)
MCV: 83.9 fL (ref 80.0–100.0)
Monocytes Absolute: 0.9 10*3/uL (ref 0.1–1.0)
Monocytes Relative: 9 %
Neutro Abs: 6.4 10*3/uL (ref 1.7–7.7)
Neutrophils Relative %: 68 %
Platelet Count: 223 10*3/uL (ref 150–400)
RBC: 3.35 MIL/uL — ABNORMAL LOW (ref 4.22–5.81)
RDW: 18.5 % — ABNORMAL HIGH (ref 11.5–15.5)
WBC Count: 9.6 10*3/uL (ref 4.0–10.5)
nRBC: 0 % (ref 0.0–0.2)

## 2021-09-27 LAB — CMP (CANCER CENTER ONLY)
ALT: <5 U/L (ref 0–44)
AST: 12 U/L — ABNORMAL LOW (ref 15–41)
Albumin: 2.1 g/dL — ABNORMAL LOW (ref 3.5–5.0)
Alkaline Phosphatase: 100 U/L (ref 38–126)
Anion gap: 13 (ref 5–15)
BUN: 21 mg/dL (ref 8–23)
CO2: 27 mmol/L (ref 22–32)
Calcium: 8.8 mg/dL — ABNORMAL LOW (ref 8.9–10.3)
Chloride: 100 mmol/L (ref 98–111)
Creatinine: 3.31 mg/dL (ref 0.61–1.24)
GFR, Estimated: 19 mL/min — ABNORMAL LOW (ref >60)
Glucose, Bld: 112 mg/dL — ABNORMAL HIGH (ref 70–99)
Potassium: 3.5 mmol/L (ref 3.5–5.1)
Sodium: 140 mmol/L (ref 135–145)
Total Bilirubin: 0.4 mg/dL (ref 0.3–1.2)
Total Protein: 6 g/dL — ABNORMAL LOW (ref 6.5–8.1)

## 2021-09-27 LAB — TYPE AND SCREEN
ABO/RH(D): A NEG
Antibody Screen: NEGATIVE

## 2021-09-27 MED ORDER — DARATUMUMAB-HYALURONIDASE-FIHJ 1800-30000 MG-UT/15ML ~~LOC~~ SOLN
1800.0000 mg | Freq: Once | SUBCUTANEOUS | Status: AC
  Administered 2021-09-27: 1800 mg via SUBCUTANEOUS
  Filled 2021-09-27: qty 15

## 2021-09-27 MED ORDER — DIPHENHYDRAMINE HCL 25 MG PO CAPS
50.0000 mg | ORAL_CAPSULE | Freq: Once | ORAL | Status: AC
  Administered 2021-09-27: 50 mg via ORAL
  Filled 2021-09-27: qty 2

## 2021-09-27 MED ORDER — MONTELUKAST SODIUM 10 MG PO TABS
10.0000 mg | ORAL_TABLET | Freq: Once | ORAL | Status: AC
  Administered 2021-09-27: 10 mg via ORAL
  Filled 2021-09-27: qty 1

## 2021-09-27 MED ORDER — DEXAMETHASONE 4 MG PO TABS
20.0000 mg | ORAL_TABLET | Freq: Once | ORAL | Status: AC
  Administered 2021-09-27: 20 mg via ORAL
  Filled 2021-09-27: qty 5

## 2021-09-27 MED ORDER — ACETAMINOPHEN 325 MG PO TABS
650.0000 mg | ORAL_TABLET | Freq: Once | ORAL | Status: AC
  Administered 2021-09-27: 650 mg via ORAL
  Filled 2021-09-27: qty 2

## 2021-09-27 MED ORDER — SODIUM CHLORIDE 0.9 % IV SOLN: INTRAVENOUS | Status: DC

## 2021-09-27 NOTE — Patient Instructions (Signed)
[facility] MEDICAL [facility]COLOGY   ?Discharge Instructions: ?Thank you for choosing [facility] to provide your oncology and hematology care.  ? ?If you have a lab appointment with the Cancer Center, please go directly to the Cancer Center and check in at the registration area. ?  ?Wear comfortable clothing and clothing appropriate for easy access to any Portacath or PICC line.  ? ?We strive to give you quality time with your provider. You may need to reschedule your appointment if you arrive late (15 or more minutes).  Arriving late affects you and other patients whose appointments are after yours.  Also, if you miss three or more appointments without notifying the office, you may be dismissed from the clinic at the provider?s discretion.    ?  ?For prescription refill requests, have your pharmacy contact our office and allow 72 hours for refills to be completed.   ? ?Today you received the following chemotherapy and/or immunotherapy agents: daratumumab-hyaluronidase    ?  ?To help prevent nausea and vomiting after your treatment, we encourage you to take your nausea medication as directed. ? ?BELOW ARE SYMPTOMS THAT SHOULD BE REPORTED IMMEDIATELY: ?*FEVER GREATER THAN 100.4 F (38 ?C) OR HIGHER ?*CHILLS OR SWEATING ?*NAUSEA AND VOMITING THAT IS NOT CONTROLLED WITH YOUR NAUSEA MEDICATION ?*UNUSUAL SHORTNESS OF BREATH ?*UNUSUAL BRUISING OR BLEEDING ?*URINARY PROBLEMS (pain or burning when urinating, or frequent urination) ?*BOWEL PROBLEMS (unusual diarrhea, constipation, pain near the anus) ?TENDERNESS IN MOUTH AND THROAT WITH OR WITHOUT PRESENCE OF ULCERS (sore throat, sores in mouth, or a toothache) ?UNUSUAL RASH, SWELLING OR PAIN  ?UNUSUAL VAGINAL DISCHARGE OR ITCHING  ? ?Items with * indicate a potential emergency and should be followed up as soon as possible or go to the Emergency Department if any problems should occur. ? ?Please show the CHEMOTHERAPY ALERT CARD or IMMUNOTHERAPY ALERT  CARD at check-in to the Emergency Department and triage nurse. ? ?Should you have questions after your visit or need to cancel or reschedule your appointment, please contact [facility]  Dept: [phone]  and follow the prompts.  Office hours are 8:00 a.m. to 4:30 p.m. Monday - Friday. Please note that voicemails left after 4:00 p.m. may not be returned until the following business day.  We are closed weekends and major holidays. You have access to a nurse at all times for urgent questions. Please call the main number to the clinic Dept: [phone] and follow the prompts. ? ? ?For any non-urgent questions, you may also contact your provider using MyChart. We now offer e-Visits for anyone 18 and older to request care online for non-urgent symptoms. For details visit [email or web address]. ?  ?Also download the MyChart app! Go to the app store, search "MyChart", open the app, select [facility], and log in with your MyChart username and password. ? ?Due to Covid, a mask is required upon entering the hospital/clinic. If you do not have a mask, one will be given to you upon arrival. For doctor visits, patients may have 1 support person aged 18 or older with them. For treatment visits, patients cannot have anyone with them due to current Covid guidelines and our immunocompromised population.  ? ?

## 2021-09-27 NOTE — Progress Notes (Signed)
[facility]   Telephone:(336) [phone] Fax:(336) [phone]   Clinic Follow up Note   Patient Care Team: Pcp, No as PCP - General  Date of Service:  09/27/2021  CHIEF COMPLAINT: f/u of AL amyloidosis/plasma cell myeloma  CURRENT THERAPY:  Weekly Dexa, Velcade and Cytoxan, started 09/10/21  -daratumumab injection to be added today   ASSESSMENT & PLAN:  Caleb Simmons is a 70 y.o. male with   1. AL amyloidosis with renal, cardiac and neuro involvement  -initially presented with weakness and SOB; lab work up showed BUN of 44, Cr 6.5, significant proteinuria/RBC on UA. Right renal biopsy on 08/28/21 revealing AL amyloidosis, lambda light chain composition. 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 Both serum and urine lamda Free light chains significantly elevated. -He started hemodialysis in the hospital, and currently on Monday Wednesday Friday schedule. -Due to the multiorgan involvement, with significant CHF (EF 45-50%) and renal failure, his prognosis is very poor. -He began weekly oral Cytoxan, dexa and Velcade injection while inpatient on 09/10/21.  He is tolerating well so far  -Plan to start daratumumab injection today, will continue every week for 8 weeks, then changed to every other week.  2. Orthostatic Hypotension and CHF  -Likely secondary to amyloidosis with neuro involvement  -He had a significant symptomatic orthostasis, SBP drops to 70's when he stands ip -I recommend him to use compression stocks -We will defer the management to his cardiologist Dr. Trey Paula, I sent an urgent message to Dr. Aundra Dubin for his follow-up appointment  3. 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  He received a blood transfusion, -He has started epo by his nephrologist  4. Acute kidney injury secondary to #1 -he is on dialysis  Mon, Wed, Fri.   PLAN: -I gave him  normal saline 500 mL over 2 hours in the infusion today for his orthostatic hypotension -Proceed with first daratumumab injection today, and continue every week -I spoke with his nephrologist Dr. Justin Mend about his orthostatic hypotension and volume management -I sent urgent message to his cardiologist Dr. Trey Paula for his follow-up appointment -I will see him back next week before weekly 4 CyBorD and Dara injection  -I spoke with his friend and daughter-in-law outside of infusion room and updated them    No problem-specific Assessment & Plan notes found for this encounter.   INTERVAL HISTORY:  [name] is here for a follow up of AL amyloidosis/plasma cell myeloma. He was last seen by me on 09/24/21 while he was in the hospital. He was seen in the infusion area.  He felt dizzy and was diaphoretic when he stood up in the infusion room, and was found to be hypotensive.  IV fluids started, he felt better and symptoms resolved after he laid in the recliner.  This happened in the dialysis center yesterday.  He denies fever or chills, he has good urine output.   All other systems were reviewed with the patient and are negative.  MEDICAL HISTORY:  No past medical history on file.  SURGICAL HISTORY: Past Surgical History:  Procedure Laterality Date   APPENDECTOMY     IR EMBO ART  VEN HEMORR LYMPH EXTRAV  INC GUIDE ROADMAPPING  08/30/2021   IR FLUORO GUIDE CV LINE RIGHT  08/30/2021   IR FLUORO GUIDE CV LINE RIGHT  09/18/2021   IR RENAL SELECTIVE  UNI INC S&I  MOD SED  08/31/2021   IR US GUIDE VASC ACCESS RIGHT  08/30/2021   IR US GUIDE VASC ACCESS RIGHT  08/30/2021   IR US GUIDE VASC ACCESS RIGHT  09/18/2021    I have reviewed the social history and family history with the patient and they are unchanged from previous note.  ALLERGIES:  has No Known Allergies.  MEDICATIONS:  Current Outpatient Medications  Medication Sig Dispense Refill   acetaminophen (TYLENOL) 325 MG tablet Take 2 tablets (650 mg  total) by mouth every 6 (six) hours as needed for moderate pain (headache).     folic acid (FOLVITE) 1 MG tablet Take 2 tablets (2 mg total) by mouth daily. 60 tablet 0   ondansetron (ZOFRAN) 4 MG tablet Take 1 tablet (4 mg total) by mouth every 6 (six) hours as needed for nausea or vomiting. 20 tablet 0   oxyCODONE (OXY IR/ROXICODONE) 5 MG immediate release tablet Take 1 tablet (5 mg total) by mouth every 6 (six) hours as needed for severe pain. 20 tablet 0   pantoprazole (PROTONIX) 40 MG tablet Take 1 tablet (40 mg total) by mouth 2 (two) times daily before a meal. 60 tablet 0   prochlorperazine (COMPAZINE) 5 MG tablet Take 1 tablet (5 mg total) by mouth every 6 (six) hours as needed for nausea or vomiting. 30 tablet 0   promethazine-dextromethorphan (PROMETHAZINE-DM) 6.25-15 MG/5ML syrup Take 5 mLs by mouth every 4 (four) hours as needed for cough.     tamsulosin (FLOMAX) 0.4 MG CAPS capsule Take 1 capsule (0.4 mg total) by mouth daily. 30 capsule 0   No current facility-administered medications for this visit.    PHYSICAL EXAMINATION: ECOG PERFORMANCE STATUS: 3 - Symptomatic, >50% confined to bed  There were no vitals filed for this visit. Wt Readings from Last 3 Encounters:  09/24/21 155 lb 9.6 oz (70.6 kg)     GENERAL:alert, no distress and comfortable SKIN: skin color normal, no rashes or significant lesions EYES: normal, Conjunctiva are pink and non-injected, sclera clear  NEURO: alert & oriented x 3 with fluent speech  LABORATORY DATA:  I have reviewed the data as listed CBC Latest Ref Rng & Units 09/27/2021 09/24/2021 09/23/2021  WBC 4.0 - 10.5 K/uL 9.6 8.6 8.3  Hemoglobin 13.0 - 17.0 g/dL 9.1(L) 8.2(L) 8.5(L)  Hematocrit 39.0 - 52.0 % 28.1(L) 26.8(L) 28.0(L)  Platelets 150 - 400 K/uL 223 196 201     CMP Latest Ref Rng & Units 09/27/2021 09/24/2021 09/23/2021  Glucose 70 - 99 mg/dL 112(H) 94 85  BUN 8 - 23 mg/dL 21 29(H) 25(H)  Creatinine 0.61 - 1.24 mg/dL 3.31(HH) 4.83(H)  4.29(H)  Sodium 135 - 145 mmol/L 140 134(L) 137  Potassium 3.5 - 5.1 mmol/L 3.5 4.1 4.2  Chloride 98 - 111 mmol/L 100 104 105  CO2 22 - 32 mmol/L 27 22 23   Calcium 8.9 - 10.3 mg/dL 8.8(L) 8.6(L) 8.5(L)  Total Protein 6.5 - 8.1 g/dL 6.0(L) - -  Total Bilirubin 0.3 - 1.2 mg/dL 0.4 - -  Alkaline Phos 38 - 126 U/L 100 - -  AST 15 - 41 U/L 12(L) - -  ALT 0 - 44 U/L <5 - -      RADIOGRAPHIC STUDIES: I have personally reviewed the radiological images as listed and agreed with the findings in the report. No results found.    No orders of the defined types were placed in this encounter.  All questions were answered. The patient knows to call  the clinic with any problems, questions or concerns. No barriers to learning was detected. The total time spent in the appointment was 40 minutes.     Truitt Merle, MD 09/27/2021   I, Wilburn Mylar, am acting as scribe for Truitt Merle, MD.   I have reviewed the above documentation for accuracy and completeness, and I agree with the above.

## 2021-09-27 NOTE — Telephone Encounter (Signed)
CRITICAL VALUE STICKER  CRITICAL VALUE: CREATININE 3.31  RECEIVER (on-site recipient of call): Refoel Palladino P. LPN  DATE & TIME NOTIFIED: 09/27/2021  MESSENGER (representative from lab): Rosann Auerbach    MD NOTIFIED: Dr. Burr Medico   TIME OF NOTIFICATION: 8.46 am

## 2021-09-27 NOTE — Progress Notes (Signed)
Dr. Burr Medico to infusion room to see patient, verbal order for orthostatic vital signs was given. Dr. Burr Medico notified of patient's BP & heart rate and per Dr. Burr Medico, no more IV fluids, patient is safe to be discharged home after observation period is finished.

## 2021-09-27 NOTE — Progress Notes (Signed)
Patient presented to infusion room c/o "I just feel so bad." Noted facial palor. Caleb Simmons reports postural dizziness since dialysis treatment yesterday. Dr. Burr Medico evaluated patient in the treatment room and orders received. Patient began improving with IVF bolus. At time of discharge, patient verbalizing significant improvement in how he felt. Denied dizziness or diaphoresis.

## 2021-10-02 ENCOUNTER — Telehealth: Payer: Self-pay | Admitting: Nurse Practitioner

## 2021-10-02 NOTE — Telephone Encounter (Signed)
Scheduled per referral, pt has been called and confirmed appt

## 2021-10-03 ENCOUNTER — Other Ambulatory Visit: Payer: Self-pay | Admitting: Hematology

## 2021-10-03 DIAGNOSIS — E8581 Light chain (AL) amyloidosis: Secondary | ICD-10-CM

## 2021-10-04 ENCOUNTER — Other Ambulatory Visit: Payer: Self-pay

## 2021-10-04 ENCOUNTER — Inpatient Hospital Stay: Payer: Commercial Managed Care - PPO

## 2021-10-04 ENCOUNTER — Encounter: Payer: Self-pay | Admitting: *Deleted

## 2021-10-04 ENCOUNTER — Inpatient Hospital Stay (HOSPITAL_BASED_OUTPATIENT_CLINIC_OR_DEPARTMENT_OTHER): Payer: Commercial Managed Care - PPO | Admitting: Hematology

## 2021-10-04 ENCOUNTER — Telehealth: Payer: Self-pay

## 2021-10-04 VITALS — BP 92/53 | HR 88 | Temp 98.3°F | Resp 18 | Ht 70.0 in | Wt 158.0 lb

## 2021-10-04 VITALS — BP 98/66 | HR 90 | Temp 98.1°F | Resp 18

## 2021-10-04 DIAGNOSIS — E8581 Light chain (AL) amyloidosis: Secondary | ICD-10-CM

## 2021-10-04 DIAGNOSIS — Z5112 Encounter for antineoplastic immunotherapy: Secondary | ICD-10-CM | POA: Diagnosis not present

## 2021-10-04 LAB — CBC WITH DIFFERENTIAL/PLATELET
Abs Immature Granulocytes: 0.03 10*3/uL (ref 0.00–0.07)
Basophils Absolute: 0 10*3/uL (ref 0.0–0.1)
Basophils Relative: 0 %
Eosinophils Absolute: 0.2 10*3/uL (ref 0.0–0.5)
Eosinophils Relative: 2 %
HCT: 26.8 % — ABNORMAL LOW (ref 39.0–52.0)
Hemoglobin: 8.5 g/dL — ABNORMAL LOW (ref 13.0–17.0)
Immature Granulocytes: 0 %
Lymphocytes Relative: 18 %
Lymphs Abs: 1.5 10*3/uL (ref 0.7–4.0)
MCH: 27.5 pg (ref 26.0–34.0)
MCHC: 31.7 g/dL (ref 30.0–36.0)
MCV: 86.7 fL (ref 80.0–100.0)
Monocytes Absolute: 0.6 10*3/uL (ref 0.1–1.0)
Monocytes Relative: 8 %
Neutro Abs: 6 10*3/uL (ref 1.7–7.7)
Neutrophils Relative %: 72 %
Platelets: 204 10*3/uL (ref 150–400)
RBC: 3.09 MIL/uL — ABNORMAL LOW (ref 4.22–5.81)
RDW: 19.7 % — ABNORMAL HIGH (ref 11.5–15.5)
WBC: 8.3 10*3/uL (ref 4.0–10.5)
nRBC: 0 % (ref 0.0–0.2)

## 2021-10-04 LAB — CMP (CANCER CENTER ONLY)
ALT: 8 U/L (ref 0–44)
AST: 10 U/L — ABNORMAL LOW (ref 15–41)
Albumin: 2.5 g/dL — ABNORMAL LOW (ref 3.5–5.0)
Alkaline Phosphatase: 87 U/L (ref 38–126)
Anion gap: 9 (ref 5–15)
BUN: 23 mg/dL (ref 8–23)
CO2: 31 mmol/L (ref 22–32)
Calcium: 7.9 mg/dL — ABNORMAL LOW (ref 8.9–10.3)
Chloride: 94 mmol/L — ABNORMAL LOW (ref 98–111)
Creatinine: 3.38 mg/dL (ref 0.61–1.24)
GFR, Estimated: 19 mL/min — ABNORMAL LOW (ref >60)
Glucose, Bld: 97 mg/dL (ref 70–99)
Potassium: 3.7 mmol/L (ref 3.5–5.1)
Sodium: 134 mmol/L — ABNORMAL LOW (ref 135–145)
Total Bilirubin: 0.4 mg/dL (ref 0.3–1.2)
Total Protein: 5.9 g/dL — ABNORMAL LOW (ref 6.5–8.1)

## 2021-10-04 MED ORDER — MONTELUKAST SODIUM 10 MG PO TABS
10.0000 mg | ORAL_TABLET | Freq: Once | ORAL | Status: AC
  Administered 2021-10-04: 10 mg via ORAL
  Filled 2021-10-04: qty 1

## 2021-10-04 MED ORDER — ACETAMINOPHEN 325 MG PO TABS
650.0000 mg | ORAL_TABLET | Freq: Once | ORAL | Status: AC
  Administered 2021-10-04: 650 mg via ORAL
  Filled 2021-10-04: qty 2

## 2021-10-04 MED ORDER — DIPHENHYDRAMINE HCL 25 MG PO CAPS
50.0000 mg | ORAL_CAPSULE | Freq: Once | ORAL | Status: AC
  Administered 2021-10-04: 50 mg via ORAL
  Filled 2021-10-04: qty 2

## 2021-10-04 MED ORDER — BORTEZOMIB CHEMO SQ INJECTION 3.5 MG (2.5MG/ML)
1.3000 mg/m2 | Freq: Once | INTRAMUSCULAR | Status: AC
  Administered 2021-10-04: 2.5 mg via SUBCUTANEOUS
  Filled 2021-10-04: qty 1

## 2021-10-04 MED ORDER — DARATUMUMAB-HYALURONIDASE-FIHJ 1800-30000 MG-UT/15ML ~~LOC~~ SOLN
1800.0000 mg | Freq: Once | SUBCUTANEOUS | Status: AC
  Administered 2021-10-04: 1800 mg via SUBCUTANEOUS
  Filled 2021-10-04: qty 15

## 2021-10-04 MED ORDER — DEXAMETHASONE 4 MG PO TABS
20.0000 mg | ORAL_TABLET | Freq: Once | ORAL | Status: AC
  Administered 2021-10-04: 20 mg via ORAL
  Filled 2021-10-04: qty 5

## 2021-10-04 MED ORDER — OXYCODONE HCL 5 MG PO TABS: 5.0000 mg | ORAL_TABLET | Freq: Four times a day (QID) | ORAL | 0 refills | Status: DC | PRN

## 2021-10-04 NOTE — Progress Notes (Signed)
[facility]   Telephone:(336) [phone] Fax:(336) [phone]   Clinic Follow up Note   Patient Care Team: Pcp, No as PCP - General  Date of Service:  10/04/2021  CHIEF COMPLAINT: f/u of AL amyloidosis/plasma cell myeloma  CURRENT THERAPY:  Weekly Dexa, Velcade and Cytoxan, started 09/10/21 -daratumumab injection added 09/27/21  ASSESSMENT & PLAN:  Caleb Simmons is a 70 y.o. male with   1. AL amyloidosis with renal, cardiac and neuro involvement  -initially presented with weakness and SOB; lab work up showed BUN of 44, Cr 6.5, significant proteinuria/RBC on UA. Right renal biopsy on 08/28/21 revealing AL amyloidosis, lambda light chain composition. 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 Both serum and urine lamda Free light chains significantly elevated. -He started hemodialysis in the hospital, and currently on Monday Wednesday Friday schedule. -Due to the multiorgan involvement, with significant CHF (EF 45-50%) and renal failure, his prognosis is very poor. -He began weekly oral Cytoxan, dexa and Velcade injection while inpatient on 09/10/21.  He is tolerating well so far  -He began daratumumab injection 09/27/21, will continue every week for 8 weeks, then changed to every other week.   2. Orthostatic Hypotension and CHF  -Likely secondary to amyloidosis with neuro involvement  -He had a significant symptomatic orthostasis, SBP drops to 70's when he stands ip -I recommend him to use compression stocks -he has not yet been scheduled for outpatient follow up with Dr. Aundra Dubin. I will reach out about this again.   3. 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  He received a blood transfusion, -He has started epo by his nephrologist   4. Acute kidney injury secondary to #1 -he is on dialysis  Mon, Wed, Fri.  5. Symptom management: Shoulder Pain,  constipation -he developed shoulder pain following port placement on 08/30/21. -he is taking oxycodone at night so he can sleep. -the oxy has caused constipation. I advised him to try miralax first, then magnesium citrate if miralax doesn't cause a bowel movement by noon tomorrow.     PLAN: -Proceed with weekly CyBorD and Dara injection -I will send another urgent message to his cardiologist Dr. Trey Paula for his follow-up appointment -lab and CyBorD and Dara injection weekly x4 on Thursdays -f/u in 2 weeks -I refilled his oxycodone    No problem-specific Assessment & Plan notes found for this encounter.   INTERVAL HISTORY:  Caleb Simmons is here for a follow up of AL amyloidosis/plasma cell myeloma. He was last seen by me on 09/27/21. He presents to the clinic accompanied by his son. He reports he has been having shoulder pain since his port was placed. He notes it can wake him up at night, so he is taking the oxycodone only at night. He reports constipation, likely from the oxycodone. He notes his last bowel movement was the day before yesterday.   All other systems were reviewed with the patient and are negative.  MEDICAL HISTORY:  No past medical history on file.  SURGICAL HISTORY: Past Surgical History:  Procedure Laterality Date   APPENDECTOMY     IR EMBO ART  VEN HEMORR LYMPH EXTRAV  INC GUIDE ROADMAPPING  08/30/2021   IR FLUORO GUIDE CV LINE RIGHT  08/30/2021   IR FLUORO GUIDE CV LINE RIGHT  09/18/2021   IR RENAL SELECTIVE  UNI INC S&I MOD SED  08/31/2021  IR US GUIDE VASC ACCESS RIGHT  08/30/2021   IR US GUIDE VASC ACCESS RIGHT  08/30/2021   IR US GUIDE VASC ACCESS RIGHT  09/18/2021    I have reviewed the social history and family history with the patient and they are unchanged from previous note.  ALLERGIES:  has No Known Allergies.  MEDICATIONS:  Current Outpatient Medications  Medication Sig Dispense Refill   acetaminophen (TYLENOL) 325 MG tablet Take 2 tablets (650 mg total)  by mouth every 6 (six) hours as needed for moderate pain (headache).     folic acid (FOLVITE) 1 MG tablet Take 2 tablets (2 mg total) by mouth daily. 60 tablet 0   ondansetron (ZOFRAN) 4 MG tablet Take 1 tablet (4 mg total) by mouth every 6 (six) hours as needed for nausea or vomiting. 20 tablet 0   oxyCODONE (OXY IR/ROXICODONE) 5 MG immediate release tablet Take 1 tablet (5 mg total) by mouth every 6 (six) hours as needed for severe pain. 30 tablet 0   pantoprazole (PROTONIX) 40 MG tablet Take 1 tablet (40 mg total) by mouth 2 (two) times daily before a meal. 60 tablet 0   prochlorperazine (COMPAZINE) 5 MG tablet Take 1 tablet (5 mg total) by mouth every 6 (six) hours as needed for nausea or vomiting. 30 tablet 0   promethazine-dextromethorphan (PROMETHAZINE-DM) 6.25-15 MG/5ML syrup Take 5 mLs by mouth every 4 (four) hours as needed for cough.     tamsulosin (FLOMAX) 0.4 MG CAPS capsule Take 1 capsule (0.4 mg total) by mouth daily. 30 capsule 0   No current facility-administered medications for this visit.    PHYSICAL EXAMINATION: ECOG PERFORMANCE STATUS: 3 - Symptomatic, >50% confined to bed  Vitals:   10/04/21 1232  BP: (!) 92/53  Pulse: 88  Resp: 18  Temp: 98.3 F (36.8 C)  SpO2: 99%   Wt Readings from Last 3 Encounters:  10/04/21 158 lb (71.7 kg)  09/24/21 155 lb 9.6 oz (70.6 kg)     GENERAL:alert, no distress and comfortable SKIN: skin color normal, no rashes or significant lesions EYES: normal, Conjunctiva are pink and non-injected, sclera clear  NEURO: alert & oriented x 3 with fluent speech  LABORATORY DATA:  I have reviewed the data as listed CBC Latest Ref Rng & Units 10/04/2021 09/27/2021 09/24/2021  WBC 4.0 - 10.5 K/uL 8.3 9.6 8.6  Hemoglobin 13.0 - 17.0 g/dL 8.5(L) 9.1(L) 8.2(L)  Hematocrit 39.0 - 52.0 % 26.8(L) 28.1(L) 26.8(L)  Platelets 150 - 400 K/uL 204 223 196     CMP Latest Ref Rng & Units 10/04/2021 09/27/2021 09/24/2021  Glucose 70 - 99 mg/dL 97 112(H)  94  BUN 8 - 23 mg/dL 23 21 29(H)  Creatinine 0.61 - 1.24 mg/dL 3.38(HH) 3.31(HH) 4.83(H)  Sodium 135 - 145 mmol/L 134(L) 140 134(L)  Potassium 3.5 - 5.1 mmol/L 3.7 3.5 4.1  Chloride 98 - 111 mmol/L 94(L) 100 104  CO2 22 - 32 mmol/L _0 Calcium 8.9 - 10.3 mg/dL 7.9(L) 8.8(L) 8.6(L)  Total Protein 6.5 - 8.1 g/dL 5.9(L) 6.0(L) -  Total Bilirubin 0.3 - 1.2 mg/dL 0.4 0.4 -  Alkaline Phos 38 - 126 U/L 87 100 -  AST 15 - 41 U/L 10(L) 12(L) -  ALT 0 - 44 U/L 8 <5 -      RADIOGRAPHIC STUDIES: I have personally reviewed the radiological images as listed and agreed with the findings in the report. No results found.    No orders of  the defined types were placed in this encounter.  All questions were answered. The patient knows to call the clinic with any problems, questions or concerns. No barriers to learning was detected. The total time spent in the appointment was 30 minutes.     Truitt Merle, MD 10/04/2021   I, Wilburn Mylar, am acting as scribe for Truitt Merle, MD.   I have reviewed the above documentation for accuracy and completeness, and I agree with the above.

## 2021-10-04 NOTE — Telephone Encounter (Signed)
CRITICAL VALUE STICKER  CRITICAL VALUE:  CREATININE 3.38  RECEIVER (on-site recipient of call):Justyna Timoney P. LPN     DATE & TIME NOTIFIED: 10/04/2021 1:59 pm  MESSENGER (representative from lab): Ulice Dash  MD NOTIFIED: Dr. Burr Medico

## 2021-10-04 NOTE — Patient Instructions (Signed)
[facility]   Discharge Instructions: Thank you for choosing [facility] to provide your oncology and hematology care.   If you have a lab appointment with the [facility], please go directly to the [facility] and check in at the registration area.   Wear comfortable clothing and clothing appropriate for easy access to any Portacath or PICC line.   We strive to give you quality time with your provider. You may need to reschedule your appointment if you arrive late (15 or more minutes).  Arriving late affects you and other patients whose appointments are after yours.  Also, if you miss three or more appointments without notifying the office, you may be dismissed from the clinic at the provider's discretion.      For prescription refill requests, have your pharmacy contact our office and allow 72 hours for refills to be completed.    Today you received the following chemotherapy and/or immunotherapy agents: velcade, cytoxan and daratumumab-hyaluronidase.       To help prevent nausea and vomiting after your treatment, we encourage you to take your nausea medication as directed.  BELOW ARE SYMPTOMS THAT SHOULD BE REPORTED IMMEDIATELY: *FEVER GREATER THAN 100.4 F (38 C) OR HIGHER *CHILLS OR SWEATING *NAUSEA AND VOMITING THAT IS NOT CONTROLLED WITH YOUR NAUSEA MEDICATION *UNUSUAL SHORTNESS OF BREATH *UNUSUAL BRUISING OR BLEEDING *URINARY PROBLEMS (pain or burning when urinating, or frequent urination) *BOWEL PROBLEMS (unusual diarrhea, constipation, pain near the anus) TENDERNESS IN MOUTH AND THROAT WITH OR WITHOUT PRESENCE OF ULCERS (sore throat, sores in mouth, or a toothache) UNUSUAL RASH, SWELLING OR PAIN  UNUSUAL VAGINAL DISCHARGE OR ITCHING   Items with * indicate a potential emergency and should be followed up as soon as possible or go to the Emergency Department if any problems should occur.  Please show the CHEMOTHERAPY ALERT CARD or  IMMUNOTHERAPY ALERT CARD at check-in to the Emergency Department and triage nurse.  Should you have questions after your visit or need to cancel or reschedule your appointment, please contact [facility]  Dept: [phone]  and follow the prompts.  Office hours are 8:00 a.m. to 4:30 p.m. Monday - Friday. Please note that voicemails left after 4:00 p.m. may not be returned until the following business day.  We are closed weekends and major holidays. You have access to a nurse at all times for urgent questions. Please call the main number to the clinic Dept: [phone] and follow the prompts.   For any non-urgent questions, you may also contact your provider using MyChart. We now offer e-Visits for anyone 10 and older to request care online for non-urgent symptoms. For details visit [email or web address].   Also download the MyChart app! Go to the app store, search "MyChart", open the app, select [facility], and log in with your MyChart username and password.  Due to Covid, a mask is required upon entering the hospital/clinic. If you do not have a mask, one will be given to you upon arrival. For doctor visits, patients may have 1 support person aged 25 or older with them. For treatment visits, patients cannot have anyone with them due to current Covid guidelines and our immunocompromised population.

## 2021-10-04 NOTE — Progress Notes (Signed)
Ok to treat today Velcade, Darzalex Faspro and Cytoxan po without results of CMP per Dr. Burr Medico

## 2021-10-04 NOTE — Progress Notes (Unsigned)
CRITICAL CREATININE RESULT OF 3.38 CALLED TO, READ BACK BY AND VERIFIED WITH JOY PURYEAR RN @ [facility] ON [id number]

## 2021-10-05 ENCOUNTER — Telehealth: Payer: Self-pay | Admitting: Hematology

## 2021-10-05 ENCOUNTER — Other Ambulatory Visit: Payer: Self-pay | Admitting: Hematology

## 2021-10-05 ENCOUNTER — Other Ambulatory Visit (HOSPITAL_COMMUNITY): Payer: Self-pay

## 2021-10-05 LAB — KAPPA/LAMBDA LIGHT CHAINS
Kappa free light chain: 42.9 mg/L — ABNORMAL HIGH (ref 3.3–19.4)
Kappa, lambda light chain ratio: 0.07 — ABNORMAL LOW (ref 0.26–1.65)
Lambda free light chains: 629.4 mg/L — ABNORMAL HIGH (ref 5.7–26.3)

## 2021-10-05 MED ORDER — CYCLOPHOSPHAMIDE 50 MG PO CAPS
225.0000 mg/m2 | ORAL_CAPSULE | ORAL | 1 refills | Status: DC
  Filled 2021-10-05 – 2021-10-15 (×2): qty 36, 28d supply, fill #0
  Filled 2021-11-13: qty 36, 28d supply, fill #1

## 2021-10-05 NOTE — Telephone Encounter (Signed)
Scheduled follow-up appointments per 10/13 los. Patient's daughter is aware. 

## 2021-10-08 ENCOUNTER — Encounter (HOSPITAL_COMMUNITY): Payer: Commercial Managed Care - PPO

## 2021-10-08 LAB — MULTIPLE MYELOMA PANEL, SERUM
Albumin SerPl Elph-Mcnc: 2.6 g/dL — ABNORMAL LOW (ref 2.9–4.4)
Albumin/Glob SerPl: 1.1 (ref 0.7–1.7)
Alpha 1: 0.4 g/dL (ref 0.0–0.4)
Alpha2 Glob SerPl Elph-Mcnc: 1 g/dL (ref 0.4–1.0)
B-Globulin SerPl Elph-Mcnc: 0.8 g/dL (ref 0.7–1.3)
Gamma Glob SerPl Elph-Mcnc: 0.4 g/dL (ref 0.4–1.8)
Globulin, Total: 2.5 g/dL (ref 2.2–3.9)
IgA: 81 mg/dL (ref 61–437)
IgG (Immunoglobin G), Serum: 420 mg/dL — ABNORMAL LOW (ref 603–1613)
IgM (Immunoglobulin M), Srm: 81 mg/dL (ref 15–143)
Total Protein ELP: 5.1 g/dL — ABNORMAL LOW (ref 6.0–8.5)

## 2021-10-09 ENCOUNTER — Ambulatory Visit: Payer: Commercial Managed Care - PPO | Admitting: Nurse Practitioner

## 2021-10-11 ENCOUNTER — Telehealth: Payer: Self-pay

## 2021-10-11 ENCOUNTER — Other Ambulatory Visit: Payer: Self-pay

## 2021-10-11 ENCOUNTER — Inpatient Hospital Stay (HOSPITAL_BASED_OUTPATIENT_CLINIC_OR_DEPARTMENT_OTHER): Payer: Commercial Managed Care - PPO | Admitting: Nurse Practitioner

## 2021-10-11 ENCOUNTER — Inpatient Hospital Stay: Payer: Commercial Managed Care - PPO

## 2021-10-11 ENCOUNTER — Inpatient Hospital Stay: Payer: Commercial Managed Care - PPO | Admitting: Hematology

## 2021-10-11 VITALS — BP 102/60 | HR 90 | Temp 98.0°F | Resp 16

## 2021-10-11 DIAGNOSIS — E8581 Light chain (AL) amyloidosis: Secondary | ICD-10-CM

## 2021-10-11 DIAGNOSIS — R634 Abnormal weight loss: Secondary | ICD-10-CM

## 2021-10-11 DIAGNOSIS — Z515 Encounter for palliative care: Secondary | ICD-10-CM

## 2021-10-11 DIAGNOSIS — Z5112 Encounter for antineoplastic immunotherapy: Secondary | ICD-10-CM | POA: Diagnosis not present

## 2021-10-11 LAB — COMPREHENSIVE METABOLIC PANEL
ALT: 5 U/L (ref 0–44)
AST: 9 U/L — ABNORMAL LOW (ref 15–41)
Albumin: 2.2 g/dL — ABNORMAL LOW (ref 3.5–5.0)
Alkaline Phosphatase: 98 U/L (ref 38–126)
Anion gap: 11 (ref 5–15)
BUN: 17 mg/dL (ref 8–23)
CO2: 26 mmol/L (ref 22–32)
Calcium: 8.4 mg/dL — ABNORMAL LOW (ref 8.9–10.3)
Chloride: 99 mmol/L (ref 98–111)
Creatinine, Ser: 3.15 mg/dL (ref 0.61–1.24)
GFR, Estimated: 20 mL/min — ABNORMAL LOW (ref >60)
Glucose, Bld: 120 mg/dL — ABNORMAL HIGH (ref 70–99)
Potassium: 3.8 mmol/L (ref 3.5–5.1)
Sodium: 136 mmol/L (ref 135–145)
Total Bilirubin: <0.2 mg/dL — ABNORMAL LOW (ref 0.3–1.2)
Total Protein: 5.4 g/dL — ABNORMAL LOW (ref 6.5–8.1)

## 2021-10-11 LAB — CBC WITH DIFFERENTIAL/PLATELET
Abs Immature Granulocytes: 0.04 10*3/uL (ref 0.00–0.07)
Basophils Absolute: 0.1 10*3/uL (ref 0.0–0.1)
Basophils Relative: 1 %
Eosinophils Absolute: 0.1 10*3/uL (ref 0.0–0.5)
Eosinophils Relative: 1 %
HCT: 27.8 % — ABNORMAL LOW (ref 39.0–52.0)
Hemoglobin: 9.1 g/dL — ABNORMAL LOW (ref 13.0–17.0)
Immature Granulocytes: 1 %
Lymphocytes Relative: 19 %
Lymphs Abs: 1.6 10*3/uL (ref 0.7–4.0)
MCH: 28.1 pg (ref 26.0–34.0)
MCHC: 32.7 g/dL (ref 30.0–36.0)
MCV: 85.8 fL (ref 80.0–100.0)
Monocytes Absolute: 0.6 10*3/uL (ref 0.1–1.0)
Monocytes Relative: 7 %
Neutro Abs: 5.8 10*3/uL (ref 1.7–7.7)
Neutrophils Relative %: 71 %
Platelets: 215 10*3/uL (ref 150–400)
RBC: 3.24 MIL/uL — ABNORMAL LOW (ref 4.22–5.81)
RDW: 19.8 % — ABNORMAL HIGH (ref 11.5–15.5)
WBC: 8.2 10*3/uL (ref 4.0–10.5)
nRBC: 0 % (ref 0.0–0.2)

## 2021-10-11 MED ORDER — DIPHENHYDRAMINE HCL 25 MG PO CAPS
50.0000 mg | ORAL_CAPSULE | Freq: Once | ORAL | Status: AC
  Administered 2021-10-11: 50 mg via ORAL
  Filled 2021-10-11: qty 2

## 2021-10-11 MED ORDER — MIRTAZAPINE 7.5 MG PO TABS: 7.5000 mg | ORAL_TABLET | Freq: Every day | ORAL | 0 refills | Status: DC

## 2021-10-11 MED ORDER — DEXAMETHASONE 4 MG PO TABS
20.0000 mg | ORAL_TABLET | Freq: Once | ORAL | Status: AC
  Administered 2021-10-11: 20 mg via ORAL
  Filled 2021-10-11: qty 5

## 2021-10-11 MED ORDER — MONTELUKAST SODIUM 10 MG PO TABS
10.0000 mg | ORAL_TABLET | Freq: Once | ORAL | Status: AC
  Administered 2021-10-11: 10 mg via ORAL
  Filled 2021-10-11: qty 1

## 2021-10-11 MED ORDER — ACETAMINOPHEN 325 MG PO TABS
650.0000 mg | ORAL_TABLET | Freq: Once | ORAL | Status: AC
  Administered 2021-10-11: 650 mg via ORAL
  Filled 2021-10-11: qty 2

## 2021-10-11 MED ORDER — BORTEZOMIB CHEMO SQ INJECTION 3.5 MG (2.5MG/ML)
1.3000 mg/m2 | Freq: Once | INTRAMUSCULAR | Status: AC
  Administered 2021-10-11: 2.5 mg via SUBCUTANEOUS
  Filled 2021-10-11: qty 1

## 2021-10-11 MED ORDER — DARATUMUMAB-HYALURONIDASE-FIHJ 1800-30000 MG-UT/15ML ~~LOC~~ SOLN
1800.0000 mg | Freq: Once | SUBCUTANEOUS | Status: AC
  Administered 2021-10-11: 1800 mg via SUBCUTANEOUS
  Filled 2021-10-11: qty 15

## 2021-10-11 NOTE — Telephone Encounter (Signed)
I called Caleb Simmons son, Caleb Simmons to notify him that the provider changed his medication to Remeron and that it is was ready to be picked up at the pharmacy. He verbalized understanding and I told him to call us with any questions or concerns.

## 2021-10-11 NOTE — Patient Instructions (Signed)
[facility]   ?Discharge Instructions: ?Thank you for choosing [facility] to provide your oncology and hematology care.  ? ?If you have a lab appointment with the Cancer Center, please go directly to the Cancer Center and check in at the registration area. ?  ?Wear comfortable clothing and clothing appropriate for easy access to any Portacath or PICC line.  ? ?We strive to give you quality time with your provider. You may need to reschedule your appointment if you arrive late (15 or more minutes).  Arriving late affects you and other patients whose appointments are after yours.  Also, if you miss three or more appointments without notifying the office, you may be dismissed from the clinic at the provider?s discretion.    ?  ?For prescription refill requests, have your pharmacy contact our office and allow 72 hours for refills to be completed.   ? ?Today you received the following chemotherapy and/or immunotherapy agents: bortezomib and daratumumab-hyaluronidase-fihj    ?  ?To help prevent nausea and vomiting after your treatment, we encourage you to take your nausea medication as directed. ? ?BELOW ARE SYMPTOMS THAT SHOULD BE REPORTED IMMEDIATELY: ?*FEVER GREATER THAN 100.4 F (38 ?C) OR HIGHER ?*CHILLS OR SWEATING ?*NAUSEA AND VOMITING THAT IS NOT CONTROLLED WITH YOUR NAUSEA MEDICATION ?*UNUSUAL SHORTNESS OF BREATH ?*UNUSUAL BRUISING OR BLEEDING ?*URINARY PROBLEMS (pain or burning when urinating, or frequent urination) ?*BOWEL PROBLEMS (unusual diarrhea, constipation, pain near the anus) ?TENDERNESS IN MOUTH AND THROAT WITH OR WITHOUT PRESENCE OF ULCERS (sore throat, sores in mouth, or a toothache) ?UNUSUAL RASH, SWELLING OR PAIN  ?UNUSUAL VAGINAL DISCHARGE OR ITCHING  ? ?Items with * indicate a potential emergency and should be followed up as soon as possible or go to the Emergency Department if any problems should occur. ? ?Please show the CHEMOTHERAPY ALERT CARD or  IMMUNOTHERAPY ALERT CARD at check-in to the Emergency Department and triage nurse. ? ?Should you have questions after your visit or need to cancel or reschedule your appointment, please contact [facility]  Dept: [phone]  and follow the prompts.  Office hours are 8:00 a.m. to 4:30 p.m. Monday - Friday. Please note that voicemails left after 4:00 p.m. may not be returned until the following business day.  We are closed weekends and major holidays. You have access to a nurse at all times for urgent questions. Please call the main number to the clinic Dept: [phone] and follow the prompts. ? ? ?For any non-urgent questions, you may also contact your provider using MyChart. We now offer e-Visits for anyone 18 and older to request care online for non-urgent symptoms. For details visit [email or web address]. ?  ?Also download the MyChart app! Go to the app store, search "MyChart", open the app, select [facility], and log in with your MyChart username and password. ? ?Due to Covid, a mask is required upon entering the hospital/clinic. If you do not have a mask, one will be given to you upon arrival. For doctor visits, patients may have 1 support person aged 18 or older with them. For treatment visits, patients cannot have anyone with them due to current Covid guidelines and our immunocompromised population.  ? ?

## 2021-10-11 NOTE — Progress Notes (Signed)
[place]  [phone] [phone]   Name: Caleb Simmons Date: 10/11/2021 MRN: [id number]  DOB: 12-04-1951  Patient Care Team: Pcp, No as PCP - General Buntin, Lavonda Jumbo, RN as Registered Nurse    REASON FOR CONSULTATION: Caleb Simmons is a 70 y.o. 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  Palliative visit post-hospital follow-up for ongoing goals of care discussions and symptom management.   SOCIAL HISTORY:     reports that he has been smoking cigarettes. He has a 6.25 pack-year smoking history. He has never used smokeless tobacco.  ADVANCE DIRECTIVES:  Patient reports completed document.  [name], Caleb Simmons. is his healthcare power of attorney.  CODE STATUS: Full code  PAST MEDICAL HISTORY:No past medical history on file.  PAST SURGICAL HISTORY:  Past Surgical History:  Procedure Laterality Date   APPENDECTOMY     IR EMBO ART  VEN HEMORR LYMPH EXTRAV  INC GUIDE ROADMAPPING  08/30/2021   IR FLUORO GUIDE CV LINE RIGHT  08/30/2021   IR FLUORO GUIDE CV LINE RIGHT  09/18/2021   IR RENAL SELECTIVE  UNI INC S&I MOD SED  08/31/2021   IR US GUIDE VASC ACCESS RIGHT  08/30/2021   IR US GUIDE VASC ACCESS RIGHT  08/30/2021   IR US GUIDE VASC ACCESS RIGHT  09/18/2021    HEMATOLOGY/ONCOLOGY HISTORY:  Oncology History   No history exists.    ALLERGIES:  has No Known Allergies.  MEDICATIONS:  Current Outpatient Medications  Medication Sig Dispense Refill   acetaminophen (TYLENOL) 325 MG tablet Take 2 tablets (650 mg total) by mouth every 6 (six) hours as needed for moderate pain (headache).     cyclophosphamide (CYTOXAN) 50 MG capsule Take 9 capsules (450 mg total) by mouth once a week. Take with food to minimize GI upset. Take early in the day and maintain hydration. 36 capsule 1   folic acid (FOLVITE) 1  MG tablet Take 2 tablets (2 mg total) by mouth daily. 60 tablet 0   mirtazapine (REMERON) 7.5 MG tablet Take 1 tablet (7.5 mg total) by mouth at bedtime. 30 tablet 0   ondansetron (ZOFRAN) 4 MG tablet Take 1 tablet (4 mg total) by mouth every 6 (six) hours as needed for nausea or vomiting. 20 tablet 0   oxyCODONE (OXY IR/ROXICODONE) 5 MG immediate release tablet Take 1 tablet (5 mg total) by mouth every 6 (six) hours as needed for severe pain. 30 tablet 0   pantoprazole (PROTONIX) 40 MG tablet Take 1 tablet (40 mg total) by mouth 2 (two) times daily before a meal. 60 tablet 0   prochlorperazine (COMPAZINE) 5 MG tablet Take 1 tablet (5 mg total) by mouth every 6 (six) hours as needed for nausea or vomiting. 30 tablet 0   tamsulosin (FLOMAX) 0.4 MG CAPS capsule Take 1 capsule (0.4 mg total) by mouth daily. 30 capsule 0   promethazine-dextromethorphan (PROMETHAZINE-DM) 6.25-15 MG/5ML syrup Take 5 mLs by mouth every 4 (four) hours as needed for cough. (Patient not taking: Reported on 10/11/2021)     No current facility-administered medications for this visit.    VITAL SIGNS: There were no vitals taken for this visit. There were no vitals filed for this visit.  Estimated body mass index is 22.67 kg/m as calculated from the following:   Height as of 10/04/21: 5' 10" (1.778 m).  Weight as of 10/04/21: 158 lb (71.7 kg).  LABS: CBC:    Component Value Date/Time   WBC 8.2 10/11/2021 0941   HGB 9.1 (L) 10/11/2021 0941   HGB 9.1 (L) 09/27/2021 0733   HCT 27.8 (L) 10/11/2021 0941   PLT 215 10/11/2021 0941   PLT 223 09/27/2021 0733   MCV 85.8 10/11/2021 0941   NEUTROABS 5.8 10/11/2021 0941   LYMPHSABS 1.6 10/11/2021 0941   MONOABS 0.6 10/11/2021 0941   EOSABS 0.1 10/11/2021 0941   BASOSABS 0.1 10/11/2021 0941   Comprehensive Metabolic Panel:    Component Value Date/Time   NA 136 10/11/2021 0941   K 3.8 10/11/2021 0941   CL 99 10/11/2021 0941   CO2 26 10/11/2021 0941   BUN 17 10/11/2021  0941   CREATININE 3.15 (HH) 10/11/2021 0941   CREATININE 3.38 (HH) 10/04/2021 1201   GLUCOSE 120 (H) 10/11/2021 0941   CALCIUM 8.4 (L) 10/11/2021 0941   AST 9 (L) 10/11/2021 0941   AST 10 (L) 10/04/2021 1201   ALT 5 10/11/2021 0941   ALT 8 10/04/2021 1201   ALKPHOS 98 10/11/2021 0941   BILITOT <0.2 (L) 10/11/2021 0941   BILITOT 0.4 10/04/2021 1201   PROT 5.4 (L) 10/11/2021 0941   ALBUMIN 2.2 (L) 10/11/2021 0941    RADIOGRAPHIC STUDIES: IR Fluoro Guide CV Line Right  Result Date: 09/18/2021 INDICATION: 70 year old male referred for tunneled hemodialysis catheter EXAM: TUNNELED CENTRAL VENOUS HEMODIALYSIS CATHETER PLACEMENT WITH ULTRASOUND AND FLUOROSCOPIC GUIDANCE MEDICATIONS: 2 g Ancef. The antibiotic was given in an appropriate time interval prior to skin puncture. ANESTHESIA/SEDATION: Moderate (conscious) sedation was employed during this procedure. A total of Versed 4.0 mg and Fentanyl 200 mcg was administered intravenously. Moderate Sedation Time: 20 minutes. The patient's level of consciousness and vital signs were monitored continuously by radiology nursing throughout the procedure under my direct supervision. FLUOROSCOPY TIME:  Fluoroscopy Time: 0 minutes 30 seconds (2 mGy). COMPLICATIONS: None PROCEDURE: Informed written consent was obtained from the patient after a discussion of the risks, benefits, and alternatives to treatment. Questions regarding the procedure were encouraged and answered. The right neck and chest were prepped with chlorhexidine in a sterile fashion, and a sterile drape was applied covering the operative field. Maximum barrier sterile technique with sterile gowns and gloves were used for the procedure. A timeout was performed prior to the initiation of the procedure. Ultrasound survey was performed. Micropuncture kit was utilized to access the right internal jugular vein under direct, real-time ultrasound guidance after the overlying soft tissues were anesthetized with  1% lidocaine with epinephrine. Stab incision was made with 11 blade scalpel. Microwire would not pass centrally. Once we confirmed that the tip of the needle was within the partially occluded IJ, a Nitrex wire was passed into the right heart. The needle was removed and the micro puncture was set was advanced onto the Nitrex wire. Nitrex wire was then exchanged for the standard microwire. The microwire was then marked to measure appropriate internal catheter length. External tunneled length was estimated. A total tip to cuff length of 19 cm was selected. 035 guidewire was advanced to the level of the IVC. Skin and subcutaneous tissues of chest wall below the clavicle were generously infiltrated with 1% lidocaine for local anesthesia. A small stab incision was made with 11 blade scalpel. The selected hemodialysis catheter was tunneled in a retrograde fashion from the anterior chest wall to the venotomy incision. Serial dilation was performed and then a peel-away sheath was  placed. The catheter was then placed through the peel-away sheath with tips ultimately positioned within the superior aspect of the right atrium. Final catheter positioning was confirmed and documented with a spot radiographic image. The catheter aspirates and flushes normally. The catheter was flushed with appropriate volume heparin dwells. The catheter exit site was secured with a 0-Prolene retention suture. Gel-Foam slurry was infused into the soft tissue tract. The venotomy incision was closed Derma bond and sterile dressing. Dressings were applied at the chest wall. Patient tolerated the procedure well and remained hemodynamically stable throughout. No complications were encountered and no significant blood loss encountered. IMPRESSION: Status post image guided placement of right IJ tunneled hemodialysis catheter. Signed, Dulcy Fanny. Dellia Nims, RPVI [facility] Radiology Electronically Signed   By:  Corrie Mckusick D.O.   On: 09/18/2021 13:03   IR US Guide Vasc Access Right  Result Date: 09/18/2021 INDICATION: 70 year old male referred for tunneled hemodialysis catheter EXAM: TUNNELED CENTRAL VENOUS HEMODIALYSIS CATHETER PLACEMENT WITH ULTRASOUND AND FLUOROSCOPIC GUIDANCE MEDICATIONS: 2 g Ancef. The antibiotic was given in an appropriate time interval prior to skin puncture. ANESTHESIA/SEDATION: Moderate (conscious) sedation was employed during this procedure. A total of Versed 4.0 mg and Fentanyl 200 mcg was administered intravenously. Moderate Sedation Time: 20 minutes. The patient's level of consciousness and vital signs were monitored continuously by radiology nursing throughout the procedure under my direct supervision. FLUOROSCOPY TIME:  Fluoroscopy Time: 0 minutes 30 seconds (2 mGy). COMPLICATIONS: None PROCEDURE: Informed written consent was obtained from the patient after a discussion of the risks, benefits, and alternatives to treatment. Questions regarding the procedure were encouraged and answered. The right neck and chest were prepped with chlorhexidine in a sterile fashion, and a sterile drape was applied covering the operative field. Maximum barrier sterile technique with sterile gowns and gloves were used for the procedure. A timeout was performed prior to the initiation of the procedure. Ultrasound survey was performed. Micropuncture kit was utilized to access the right internal jugular vein under direct, real-time ultrasound guidance after the overlying soft tissues were anesthetized with 1% lidocaine with epinephrine. Stab incision was made with 11 blade scalpel. Microwire would not pass centrally. Once we confirmed that the tip of the needle was within the partially occluded IJ, a Nitrex wire was passed into the right heart. The needle was removed and the micro puncture was set was advanced onto the Nitrex wire. Nitrex wire was then exchanged for the standard microwire. The microwire was  then marked to measure appropriate internal catheter length. External tunneled length was estimated. A total tip to cuff length of 19 cm was selected. 035 guidewire was advanced to the level of the IVC. Skin and subcutaneous tissues of chest wall below the clavicle were generously infiltrated with 1% lidocaine for local anesthesia. A small stab incision was made with 11 blade scalpel. The selected hemodialysis catheter was tunneled in a retrograde fashion from the anterior chest wall to the venotomy incision. Serial dilation was performed and then a peel-away sheath was placed. The catheter was then placed through the peel-away sheath with tips ultimately positioned within the superior aspect of the right atrium. Final catheter positioning was confirmed and documented with a spot radiographic image. The catheter aspirates and flushes normally. The catheter was flushed with appropriate volume heparin dwells. The catheter exit site was secured with a 0-Prolene retention suture. Gel-Foam slurry was infused into the soft tissue tract. The venotomy incision was closed Derma bond and sterile  dressing. Dressings were applied at the chest wall. Patient tolerated the procedure well and remained hemodynamically stable throughout. No complications were encountered and no significant blood loss encountered. IMPRESSION: Status post image guided placement of right IJ tunneled hemodialysis catheter. Signed, Dulcy Fanny. Dellia Nims, RPVI [facility] Radiology Electronically Signed   By: Corrie Mckusick D.O.   On: 09/18/2021 13:03   VAS [place] UPPER EXT VEIN MAPPING (PRE-OP AVF)  Result Date: 09/20/2021 UPPER EXTREMITY VEIN MAPPING Patient Name:  Caleb Simmons  Date of Exam:   09/20/2021 Medical Rec #: [id number]     Accession #:    [id number] Date of Birth: 1951/09/03      Patient Gender: M Patient Age:   57 years Exam Location:  [facility] Procedure:      VAS [place] UPPER EXT VEIN MAPPING  (PRE-OP AVF) Referring Phys: Otelia Santee --------------------------------------------------------------------------------  Indications: Pre-access. Comparison Study: No prior studies. Performing Technologist: Darlin Coco RDMS, RVT  Examination Guidelines: A complete evaluation includes B-mode imaging, spectral Doppler, color Doppler, and power Doppler as needed of all accessible portions of each vessel. Bilateral testing is considered an integral part of a complete examination. Limited examinations for reoccurring indications may be performed as noted. +-----------------+-------------+----------+---------+ Right Cephalic   Diameter (cm)Depth (cm)Findings  +-----------------+-------------+----------+---------+ Shoulder             0.33        0.97             +-----------------+-------------+----------+---------+ Prox upper arm       0.36        0.75             +-----------------+-------------+----------+---------+ Mid upper arm        0.27        0.45   branching +-----------------+-------------+----------+---------+ Dist upper arm       0.23        0.20   branching +-----------------+-------------+----------+---------+ Antecubital fossa    0.23        0.17             +-----------------+-------------+----------+---------+ Prox forearm         0.21        0.24   branching +-----------------+-------------+----------+---------+ Mid forearm          0.25        0.27             +-----------------+-------------+----------+---------+ Dist forearm         0.19        0.32             +-----------------+-------------+----------+---------+ Wrist                0.20        0.29             +-----------------+-------------+----------+---------+ +-----------------+-------------+----------+---------+ Left Cephalic    Diameter (cm)Depth (cm)Findings  +-----------------+-------------+----------+---------+ Shoulder             0.25        1.23              +-----------------+-------------+----------+---------+ Prox upper arm       0.31        0.88             +-----------------+-------------+----------+---------+ Mid upper arm        0.37        0.52             +-----------------+-------------+----------+---------+  Dist upper arm     0.30/0.32  0.49/0.78 branching +-----------------+-------------+----------+---------+ Antecubital fossa    0.25        0.24   branching +-----------------+-------------+----------+---------+ Prox forearm         0.29        0.23             +-----------------+-------------+----------+---------+ Mid forearm        0.26/0.41  0.34/0.26 branching +-----------------+-------------+----------+---------+ Dist forearm         0.30        0.12             +-----------------+-------------+----------+---------+ Wrist                0.33        0.10             +-----------------+-------------+----------+---------+ *See table(s) above for measurements and observations.  Diagnosing physician: Orlie Pollen Electronically signed by Orlie Pollen on 09/20/2021 at 3:02:46 PM.    Final     PERFORMANCE STATUS (ECOG) : 3 - Symptomatic, >50% confined to bed  Review of Systems  Constitutional:  Positive for appetite change.  Musculoskeletal:  Positive for arthralgias.  Neurological:  Positive for weakness.  Unless otherwise noted, a complete review of systems is negative.  Physical Exam General: NAD Cardiovascular: regular rate and rhythm Pulmonary: clear ant fields Neurological: Weakness but otherwise nonfocal, AAOx3, mood appropriate  IMPRESSION:  Caleb Simmons presents today with his [name]. he received his treatment today.  No acute distress noted.  I personally saw patient during recent hospitalization to assist with goals of care and symptom management.  Patient states he has been going on for okay since discharge.  Happy to be out of the hospital.  Continues to endorse weakness, decreased  appetite, occasional pain, and insomnia.  He is currently in a wheelchair.  Tolerating dialysis.  We discussed at length his malnutrition.  Son states patient has good days and bad days and a try to take advantage of the days that are better.  Although "not so good days" patient may only take a few bites or sips from each meal.  Patient and son have been trying different protein shakes for additional support.  Education provided on the use of protein shakes in the setting of end-stage renal failure.  Encouraged son to consider nephro shakes to support patient's kidney function and also discuss other recommendations with his nephrologist.  They are trying to find a flavor that patient likes.  Son is asking about appetite stimulant as they are remaining hopeful for some improvement.  Education provided on the use of appetite stimulants with variation in results depending on each patient.  They would like to at least try to see if this aids in an improvement.  Patient states he is unable to sleep at night due to pain and other psychosocial factors related to his overall health.  He was utilizing his pain medication to along with some rest throughout the night however it is not as effective as Xanax (which he states he took during hospitalization).  Education provided on the use of appetite stimulant and medication for insomnia.  Patient also encouraged to decrease stimulation during daytime allowing his body to raise.  Son shares patient is unable to nap throughout the day for sleep at night although he acknowledges his body is tired.  Endorses daily bowel movements with daily MiraLAX.  Discussed continued use given risk for constipation with the use of  narcotics.  Caleb Simmons expresses his fatigue.  He is however remaining hopeful for stability with current treatment regimen.  States he is appreciative and his ability to tolerate dialysis at this time.  PLAN: Continue with MiraLAX daily for  constipation Remeron nightly for insomnia with added benefit of appetite stimulation.  We will plan to reevaluate in 1 week when he returns to see Dr. Burr Medico. Encouraged small frequent meals and nephro shakes. I will plan to follow-up next week to see how he does with the Remeron evaluate in his sleep pattern and any changes to his appetite.   Patient and son expressed understanding and was in agreement with this plan. He also understands that He can call the clinic at any time with any questions, concerns, or complaints.     Time Total: 40 min.   Visit consisted of counseling and education dealing with the complex and emotionally intense issues of symptom management and palliative care in the setting of serious and potentially life-threatening illness.Greater than 50%  of this time was spent counseling and coordinating care related to the above assessment and plan.  Signed by: Alda Lea, AGPCNP-BC Palliative Medicine Team

## 2021-10-12 ENCOUNTER — Other Ambulatory Visit (HOSPITAL_COMMUNITY): Payer: Self-pay

## 2021-10-15 ENCOUNTER — Other Ambulatory Visit (HOSPITAL_COMMUNITY): Payer: Self-pay

## 2021-10-16 ENCOUNTER — Other Ambulatory Visit: Payer: Self-pay

## 2021-10-16 ENCOUNTER — Telehealth: Payer: Self-pay

## 2021-10-16 ENCOUNTER — Ambulatory Visit: Payer: Commercial Managed Care - PPO | Admitting: Nurse Practitioner

## 2021-10-16 DIAGNOSIS — E8581 Light chain (AL) amyloidosis: Secondary | ICD-10-CM

## 2021-10-16 MED ORDER — PROCHLORPERAZINE MALEATE 5 MG PO TABS: 5.0000 mg | ORAL_TABLET | Freq: Four times a day (QID) | ORAL | 0 refills | Status: DC | PRN

## 2021-10-16 MED ORDER — ONDANSETRON HCL 4 MG PO TABS: 4.0000 mg | ORAL_TABLET | Freq: Four times a day (QID) | ORAL | 0 refills | Status: DC | PRN

## 2021-10-16 NOTE — Telephone Encounter (Signed)
This nurse returned call to patients daughter in law and advised that his nausea medication has been submitted for refill.  Also informed per MD that she will address his symptoms at his office visit on 10/18/21.  She acknowledged understanding.  No further questions or concerns at this time.

## 2021-10-16 NOTE — Telephone Encounter (Signed)
This nurse received a message from this patients daughter in law. Stating that patient has recently been discharged from the hospital and is needing a prescription for nausea medication called in to the pharmacy.  Also stated she is experiencing Heartburn and has a burning pain in his stomach.  Patient is currently taking Prilosec however it is not helping.  Patient also has a decreased appetite.  Daughter in law would like to know is there something else that he can take that will help with these symptoms.  Refill submitted for nausea mediations and request forwarded to MD for recommendations.  No further questions or concerns at this time.

## 2021-10-17 ENCOUNTER — Other Ambulatory Visit (HOSPITAL_COMMUNITY): Payer: Self-pay

## 2021-10-18 ENCOUNTER — Inpatient Hospital Stay: Payer: Commercial Managed Care - PPO

## 2021-10-18 ENCOUNTER — Other Ambulatory Visit: Payer: Self-pay

## 2021-10-18 ENCOUNTER — Encounter: Payer: Self-pay | Admitting: Hematology

## 2021-10-18 ENCOUNTER — Inpatient Hospital Stay (HOSPITAL_BASED_OUTPATIENT_CLINIC_OR_DEPARTMENT_OTHER): Payer: Commercial Managed Care - PPO | Admitting: Hematology

## 2021-10-18 ENCOUNTER — Inpatient Hospital Stay (HOSPITAL_BASED_OUTPATIENT_CLINIC_OR_DEPARTMENT_OTHER): Payer: Commercial Managed Care - PPO | Admitting: Nurse Practitioner

## 2021-10-18 VITALS — BP 102/60

## 2021-10-18 VITALS — BP 87/59 | HR 90 | Temp 98.6°F | Resp 18 | Ht 70.0 in | Wt 163.2 lb

## 2021-10-18 DIAGNOSIS — Z515 Encounter for palliative care: Secondary | ICD-10-CM

## 2021-10-18 DIAGNOSIS — E8581 Light chain (AL) amyloidosis: Secondary | ICD-10-CM

## 2021-10-18 DIAGNOSIS — Z5112 Encounter for antineoplastic immunotherapy: Secondary | ICD-10-CM | POA: Diagnosis not present

## 2021-10-18 DIAGNOSIS — G4700 Insomnia, unspecified: Secondary | ICD-10-CM

## 2021-10-18 LAB — CBC WITH DIFFERENTIAL/PLATELET
Abs Immature Granulocytes: 0.06 10*3/uL (ref 0.00–0.07)
Basophils Absolute: 0 10*3/uL (ref 0.0–0.1)
Basophils Relative: 1 %
Eosinophils Absolute: 0.1 10*3/uL (ref 0.0–0.5)
Eosinophils Relative: 1 %
HCT: 27.2 % — ABNORMAL LOW (ref 39.0–52.0)
Hemoglobin: 8.9 g/dL — ABNORMAL LOW (ref 13.0–17.0)
Immature Granulocytes: 1 %
Lymphocytes Relative: 17 %
Lymphs Abs: 1.3 10*3/uL (ref 0.7–4.0)
MCH: 28.5 pg (ref 26.0–34.0)
MCHC: 32.7 g/dL (ref 30.0–36.0)
MCV: 87.2 fL (ref 80.0–100.0)
Monocytes Absolute: 0.6 10*3/uL (ref 0.1–1.0)
Monocytes Relative: 8 %
Neutro Abs: 5.4 10*3/uL (ref 1.7–7.7)
Neutrophils Relative %: 72 %
Platelets: 147 10*3/uL — ABNORMAL LOW (ref 150–400)
RBC: 3.12 MIL/uL — ABNORMAL LOW (ref 4.22–5.81)
RDW: 22.3 % — ABNORMAL HIGH (ref 11.5–15.5)
WBC: 7.4 10*3/uL (ref 4.0–10.5)
nRBC: 0 % (ref 0.0–0.2)

## 2021-10-18 LAB — COMPREHENSIVE METABOLIC PANEL
ALT: 6 U/L (ref 0–44)
AST: 8 U/L — ABNORMAL LOW (ref 15–41)
Albumin: 2.2 g/dL — ABNORMAL LOW (ref 3.5–5.0)
Alkaline Phosphatase: 86 U/L (ref 38–126)
Anion gap: 11 (ref 5–15)
BUN: 20 mg/dL (ref 8–23)
CO2: 24 mmol/L (ref 22–32)
Calcium: 8.1 mg/dL — ABNORMAL LOW (ref 8.9–10.3)
Chloride: 99 mmol/L (ref 98–111)
Creatinine, Ser: 3.88 mg/dL (ref 0.61–1.24)
GFR, Estimated: 16 mL/min — ABNORMAL LOW (ref >60)
Glucose, Bld: 113 mg/dL — ABNORMAL HIGH (ref 70–99)
Potassium: 4.4 mmol/L (ref 3.5–5.1)
Sodium: 134 mmol/L — ABNORMAL LOW (ref 135–145)
Total Bilirubin: 0.3 mg/dL (ref 0.3–1.2)
Total Protein: 5.1 g/dL — ABNORMAL LOW (ref 6.5–8.1)

## 2021-10-18 MED ORDER — ACETAMINOPHEN 325 MG PO TABS
650.0000 mg | ORAL_TABLET | Freq: Once | ORAL | Status: AC
  Administered 2021-10-18: 650 mg via ORAL
  Filled 2021-10-18: qty 2

## 2021-10-18 MED ORDER — ZOLPIDEM TARTRATE 5 MG PO TABS: 5.0000 mg | ORAL_TABLET | Freq: Every evening | ORAL | 0 refills | Status: DC | PRN

## 2021-10-18 MED ORDER — DEXAMETHASONE 4 MG PO TABS
20.0000 mg | ORAL_TABLET | Freq: Once | ORAL | Status: AC
  Administered 2021-10-18: 20 mg via ORAL
  Filled 2021-10-18: qty 5

## 2021-10-18 MED ORDER — BORTEZOMIB CHEMO SQ INJECTION 3.5 MG (2.5MG/ML)
1.3000 mg/m2 | Freq: Once | INTRAMUSCULAR | Status: AC
  Administered 2021-10-18: 2.5 mg via SUBCUTANEOUS
  Filled 2021-10-18: qty 1

## 2021-10-18 MED ORDER — MIRTAZAPINE 15 MG PO TABS: 15.0000 mg | ORAL_TABLET | Freq: Every day | ORAL | Status: DC

## 2021-10-18 MED ORDER — DARATUMUMAB-HYALURONIDASE-FIHJ 1800-30000 MG-UT/15ML ~~LOC~~ SOLN
1800.0000 mg | Freq: Once | SUBCUTANEOUS | Status: AC
  Administered 2021-10-18: 1800 mg via SUBCUTANEOUS
  Filled 2021-10-18: qty 15

## 2021-10-18 MED ORDER — DIPHENHYDRAMINE HCL 25 MG PO CAPS
50.0000 mg | ORAL_CAPSULE | Freq: Once | ORAL | Status: AC
  Administered 2021-10-18: 50 mg via ORAL
  Filled 2021-10-18: qty 2

## 2021-10-18 NOTE — Progress Notes (Signed)
[place]  Telephone:(336) [phone] Fax:(336) [phone]   Name: Caleb Simmons Date: 10/18/2021 MRN: [id number]  DOB: 05/18/1951  Patient Care Team: Pcp, No as PCP - General Buntin, Lavonda Jumbo, RN as Registered Nurse    REASON FOR CONSULTATION: Caleb Simmons is a 70 y.o. 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  Palliative visit post-hospital follow-up for ongoing goals of care discussions and symptom management.    SOCIAL HISTORY:     reports that he has been smoking cigarettes. He has a 6.25 pack-year smoking history. He has never used smokeless tobacco.  ADVANCE DIRECTIVES:  Patient reports completed document.  [name], Caleb Simmons. is his healthcare power of attorney.  CODE STATUS: Full code  PAST MEDICAL HISTORY:No past medical history on file.  PAST SURGICAL HISTORY:  Past Surgical History:  Procedure Laterality Date   APPENDECTOMY     IR EMBO ART  VEN HEMORR LYMPH EXTRAV  INC GUIDE ROADMAPPING  08/30/2021   IR FLUORO GUIDE CV LINE RIGHT  08/30/2021   IR FLUORO GUIDE CV LINE RIGHT  09/18/2021   IR RENAL SELECTIVE  UNI INC S&I MOD SED  08/31/2021   IR US GUIDE VASC ACCESS RIGHT  08/30/2021   IR US GUIDE VASC ACCESS RIGHT  08/30/2021   IR US GUIDE VASC ACCESS RIGHT  09/18/2021    HEMATOLOGY/ONCOLOGY HISTORY:  Oncology History   No history exists.    ALLERGIES:  has No Known Allergies.  MEDICATIONS:  Current Outpatient Medications  Medication Sig Dispense Refill   acetaminophen (TYLENOL) 325 MG tablet Take 2 tablets (650 mg total) by mouth every 6 (six) hours as needed for moderate pain (headache).     cyclophosphamide (CYTOXAN) 50 MG capsule Take 9 capsules (450 mg total) by mouth once a week. Take with food to minimize GI upset. Take early in the day and maintain hydration. 36 capsule 1   folic acid (FOLVITE)  1 MG tablet Take 2 tablets (2 mg total) by mouth daily. 60 tablet 0   mirtazapine (REMERON) 7.5 MG tablet Take 1 tablet (7.5 mg total) by mouth at bedtime. 30 tablet 0   ondansetron (ZOFRAN) 4 MG tablet Take 1 tablet (4 mg total) by mouth every 6 (six) hours as needed for nausea or vomiting. 20 tablet 0   oxyCODONE (OXY IR/ROXICODONE) 5 MG immediate release tablet Take 1 tablet (5 mg total) by mouth every 6 (six) hours as needed for severe pain. 30 tablet 0   pantoprazole (PROTONIX) 40 MG tablet Take 1 tablet (40 mg total) by mouth 2 (two) times daily before a meal. 60 tablet 0   prochlorperazine (COMPAZINE) 5 MG tablet Take 1 tablet (5 mg total) by mouth every 6 (six) hours as needed for nausea or vomiting. 30 tablet 0   tamsulosin (FLOMAX) 0.4 MG CAPS capsule Take 1 capsule (0.4 mg total) by mouth daily. 30 capsule 0   No current facility-administered medications for this visit.   Facility-Administered Medications Ordered in Other Visits  Medication Dose Route Frequency Provider Last Rate Last Admin   bortezomib SQ (VELCADE) chemo injection (2.5mg /mL concentration) 2.5 mg  1.3 mg/m2 (Treatment Plan Recorded) Subcutaneous Once Truitt Merle, MD       daratumumab-hyaluronidase-fihj [facility] FASPRO) 1800-30000 MG-UT/15ML chemo SQ injection 1,800 mg  1,800 mg Subcutaneous Once Truitt Merle, MD        VITAL  SIGNS: There were no vitals taken for this visit. There were no vitals filed for this visit.  Estimated body mass index is 23.42 kg/m as calculated from the following:   Height as of an earlier encounter on 10/18/21: 5\' 10"  (1.778 m).   Weight as of an earlier encounter on 10/18/21: 163 lb 3.2 oz (74 kg).  LABS: CBC:    Component Value Date/Time   WBC 7.4 10/18/2021 1054   HGB 8.9 (L) 10/18/2021 1054   HGB 9.1 (L) 09/27/2021 0733   HCT 27.2 (L) 10/18/2021 1054   PLT 147 (L) 10/18/2021 1054   PLT 223 09/27/2021 0733   MCV 87.2 10/18/2021 1054   NEUTROABS 5.4 10/18/2021 1054   LYMPHSABS  1.3 10/18/2021 1054   MONOABS 0.6 10/18/2021 1054   EOSABS 0.1 10/18/2021 1054   BASOSABS 0.0 10/18/2021 1054   Comprehensive Metabolic Panel:    Component Value Date/Time   NA 134 (L) 10/18/2021 1054   K 4.4 10/18/2021 1054   CL 99 10/18/2021 1054   CO2 24 10/18/2021 1054   BUN 20 10/18/2021 1054   CREATININE 3.88 (HH) 10/18/2021 1054   CREATININE 3.38 (HH) 10/04/2021 1201   GLUCOSE 113 (H) 10/18/2021 1054   CALCIUM 8.1 (L) 10/18/2021 1054   AST 8 (L) 10/18/2021 1054   AST 10 (L) 10/04/2021 1201   ALT 6 10/18/2021 1054   ALT 8 10/04/2021 1201   ALKPHOS 86 10/18/2021 1054   BILITOT 0.3 10/18/2021 1054   BILITOT 0.4 10/04/2021 1201   PROT 5.1 (L) 10/18/2021 1054   ALBUMIN 2.2 (L) 10/18/2021 1054    RADIOGRAPHIC STUDIES: VAS [place] UPPER EXT VEIN MAPPING (PRE-OP AVF)  Result Date: 09/20/2021 [place] MAPPING Patient Name:  Caleb Simmons  Date of Exam:   09/20/2021 Medical Rec #: [id number]     Accession #:    [id number] Date of Birth: [birth year]      Patient Gender: M Patient Age:   48 years Exam Location:  [facility] Procedure:      VAS [place] UPPER EXT VEIN MAPPING (PRE-OP AVF) Referring Phys: Jeneen Rinks LIN --------------------------------------------------------------------------------  Indications: Pre-access. Comparison Study: No prior studies. Performing Technologist: Darlin Coco RDMS, RVT  Examination Guidelines: A complete evaluation includes B-mode imaging, spectral Doppler, color Doppler, and power Doppler as needed of all accessible portions of each vessel. Bilateral testing is considered an integral part of a complete examination. Limited examinations for reoccurring indications may be performed as noted. +-----------------+-------------+----------+---------+ Right Cephalic   Diameter (cm)Depth (cm)Findings  +-----------------+-------------+----------+---------+ Shoulder             0.33        0.97              +-----------------+-------------+----------+---------+ Prox upper arm       0.36        0.75             +-----------------+-------------+----------+---------+ Mid upper arm        0.27        0.45   branching +-----------------+-------------+----------+---------+ Dist upper arm       0.23        0.20   branching +-----------------+-------------+----------+---------+ Antecubital fossa    0.23        0.17             +-----------------+-------------+----------+---------+ Prox forearm         0.21        0.24   branching +-----------------+-------------+----------+---------+ Mid forearm  0.25        0.27             +-----------------+-------------+----------+---------+ Dist forearm         0.19        0.32             +-----------------+-------------+----------+---------+ Wrist                0.20        0.29             +-----------------+-------------+----------+---------+ +-----------------+-------------+----------+---------+ Left Cephalic    Diameter (cm)Depth (cm)Findings  +-----------------+-------------+----------+---------+ Shoulder             0.25        1.23             +-----------------+-------------+----------+---------+ Prox upper arm       0.31        0.88             +-----------------+-------------+----------+---------+ Mid upper arm        0.37        0.52             +-----------------+-------------+----------+---------+ Dist upper arm     0.30/0.32  0.49/0.78 branching +-----------------+-------------+----------+---------+ Antecubital fossa    0.25        0.24   branching +-----------------+-------------+----------+---------+ Prox forearm         0.29        0.23             +-----------------+-------------+----------+---------+ Mid forearm        0.26/0.41  0.34/0.26 branching +-----------------+-------------+----------+---------+ Dist forearm         0.30        0.12              +-----------------+-------------+----------+---------+ Wrist                0.33        0.10             +-----------------+-------------+----------+---------+ *See table(s) above for measurements and observations.  Diagnosing physician: Orlie Pollen Electronically signed by Orlie Pollen on 09/20/2021 at 3:02:46 PM.    Final     PERFORMANCE STATUS (ECOG) : 3 - Symptomatic, >50% confined to bed   Physical Exam General: NAD Cardiovascular: regular rate and rhythm Pulmonary: clear ant fields Neurological: AAO x3, mood appropriate   IMPRESSION:  Caleb Simmons is here today for follow-up on symptoms. No acute distress noted. Continues to be concerned about inability to sleep through the night. He shares he will sleep for about 2-3 hours and is then wide awake the remainder of the night. He started the Remeron on last week and does not feel he has noticed a difference. Appetite remains minimal. He states he does not have much of an appetite but knows he has to eat requiring himself to force food.   We discussed eating small frequent meals and at anytime he has a desire to eat. Education provided on increasing dose of Remeron to hopefully provide appetite stimulation. We were also hopeful for some improvement in sleep with Remeron however this has not been the case. Discussed use of ambien at night to aid with sleep.  Hopeful if his sleeping patterns can normalize this could also contribute to appetite stimulation and decreased fatigue throughout the day.   Caleb Simmons provided permission to update his daughter-in-law, Caleb Simmons [phone]) on medications and plan. I attempted to reach her however unavailable. Voicemail left  with my contact information.   All questions answered and support provided.   PLAN: Increase Remeron to 15 mg (2 tablets) for appetite stimulation.  Ambien QHS for insomnia (Per Dr. Burr Medico also advised to decrease steroids). Education on turning off tv when going to bed,  limited food intake to 1 hr prior to bedtime, and decreasing stimulation that could interfere with sleep pattern.  Continued education on high protein foods, small frequent meals throughout the day. Will follow-up in 1 week for ongoing symptom management.    Patient expressed understanding and was in agreement with this plan. He also understands that He can call the clinic at any time with any questions, concerns, or complaints.     Time Total: 35 min.   Visit consisted of counseling and education dealing with the complex and emotionally intense issues of symptom management and palliative care in the setting of serious and potentially life-threatening illness.Greater than 50%  of this time was spent counseling and coordinating care related to the above assessment and plan.  Signed by: Alda Lea, AGPCNP-BC Palliative Medicine Team

## 2021-10-18 NOTE — Progress Notes (Signed)
[place]   Telephone:(336) [phone] Fax:(336) [phone]   Clinic Follow up Note   Patient Care Team: Pcp, No as PCP - General Buntin, Lavonda Jumbo, RN as Registered Nurse  Date of Service:  10/18/2021  CHIEF COMPLAINT: f/u of AL amyloidosis with multiple organs involvement   CURRENT THERAPY:  Weekly Dexa, Velcade and Cytoxan, started 09/10/21 -daratumumab injection added 09/27/21  ASSESSMENT & PLAN:  Caleb Simmons is a 70 y.o. male with   1. AL amyloidosis with renal, cardiac and neuro involvement  -initially presented with weakness and SOB; lab work up showed BUN of 44, Cr 6.5, significant proteinuria/RBC on UA. Right renal biopsy on 08/28/21 revealing AL amyloidosis, lambda light chain composition. 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 Both serum and urine lamda Free light chains significantly elevated. -He started hemodialysis in the hospital, and currently on Monday Wednesday Friday schedule. -Due to the multiorgan involvement, with significant CHF (EF 45-50%) and renal failure, his prognosis is very poor. -He began weekly oral Cytoxan, dexa and Velcade injection while inpatient on 09/10/21.  He is tolerating well so far  -He began daratumumab injection 09/27/21, will continue every week for 8 weeks, then changed to every other week. -his light chain levels showed improvement on repeat labs on 10/04/21. -he is tolerating chemo well, will decrease dexa from 69m to 273mweekly due to insomnia  -not a candidate for biphosphonate due to renal failure  2. Symptom management: Nausea, Insomnia, Low appetite, Shoulder Pain, constipation -he developed shoulder pain following port placement on 08/30/21. He is taking oxycodone at night so he can sleep. -zofran helps his nausea -I will have palliative care nurse NiLexine Batonome talk to him about management of his insomnia, appetite, and nausea.   2. Orthostatic Hypotension and CHF  -Likely secondary to amyloidosis  with neuro involvement  -He had a significant symptomatic orthostasis, SBP drops to 70's when he stands ip -I recommend him to use compression stocks -he is scheduled for outpatient follow up with Dr. McAundra Dubinn 10/23/21   3. 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  He received a blood transfusion, -He has started epo by his nephrologist   4. Acute kidney injury secondary to #1 -he is on dialysis  Mon, Wed, Fri. -We will discuss with his nephrologist to see if he can do less dialysis since his amyloidosis has improved     PLAN: -Proceed with weekly CyBorD and Dara injection, decrease weekly dexamethasone from 40 mg to 20 mg due to insomnia -f/u with Dr. McAundra Dubinn 10/23/21 -lab and CyBorD and Dara injection weekly x4 on Thursdays -f/u in 3 weeks   No problem-specific Assessment & Plan notes found for this encounter.   INTERVAL HISTORY:  AlKALVEN GANIMs here for a follow up of AL amyloidosis/plasma cell myeloma. He was last seen by me on 10/11/21. He presents to the clinic accompanied by NaIzora GalaHe reports he is feeling okay. He reports nausea from chemo. He notes he is also having trouble sleeping. His daughter-in-law called with concerns for low appetite.   All other systems were reviewed with the patient and are negative.  MEDICAL HISTORY:  History reviewed. No pertinent past medical history.  SURGICAL HISTORY: Past Surgical History:  Procedure Laterality Date   APPENDECTOMY     [place] ART  [place]  08/30/2021   IR FLUORO GUIDE CV LINE RIGHT  08/30/2021   IR FLUORO GUIDE CV LINE RIGHT  09/18/2021   IR RENAL SELECTIVE  UNI INC S&I MOD SED  08/31/2021   IR US GUIDE VASC ACCESS RIGHT  08/30/2021   IR US GUIDE VASC ACCESS RIGHT  08/30/2021   IR US GUIDE VASC ACCESS RIGHT  09/18/2021    I have reviewed the social history and family history with the  patient and they are unchanged from previous note.  ALLERGIES:  has No Known Allergies.  MEDICATIONS:  Current Outpatient Medications  Medication Sig Dispense Refill   acetaminophen (TYLENOL) 325 MG tablet Take 2 tablets (650 mg total) by mouth every 6 (six) hours as needed for moderate pain (headache).     cyclophosphamide (CYTOXAN) 50 MG capsule Take 9 capsules (450 mg total) by mouth once a week. Take with food to minimize GI upset. Take early in the day and maintain hydration. 36 capsule 1   folic acid (FOLVITE) 1 MG tablet Take 2 tablets (2 mg total) by mouth daily. 60 tablet 0   mirtazapine (REMERON) 15 MG tablet Take 1 tablet (15 mg total) by mouth at bedtime.     ondansetron (ZOFRAN) 4 MG tablet Take 1 tablet (4 mg total) by mouth every 6 (six) hours as needed for nausea or vomiting. 20 tablet 0   oxyCODONE (OXY IR/ROXICODONE) 5 MG immediate release tablet Take 1 tablet (5 mg total) by mouth every 6 (six) hours as needed for severe pain. 30 tablet 0   pantoprazole (PROTONIX) 40 MG tablet Take 1 tablet (40 mg total) by mouth 2 (two) times daily before a meal. 60 tablet 0   prochlorperazine (COMPAZINE) 5 MG tablet Take 1 tablet (5 mg total) by mouth every 6 (six) hours as needed for nausea or vomiting. 30 tablet 0   tamsulosin (FLOMAX) 0.4 MG CAPS capsule Take 1 capsule (0.4 mg total) by mouth daily. 30 capsule 0   zolpidem (AMBIEN) 5 MG tablet Take 1 tablet (5 mg total) by mouth at bedtime as needed for sleep. 30 tablet 0   No current facility-administered medications for this visit.    PHYSICAL EXAMINATION: ECOG PERFORMANCE STATUS: 3 - Symptomatic, >50% confined to bed  Vitals:   10/18/21 1137  BP: (!) 87/59  Pulse: 90  Resp: 18  Temp: 98.6 F (37 C)  SpO2: 98%   Wt Readings from Last 3 Encounters:  10/18/21 74 kg  10/04/21 71.7 kg  09/24/21 70.6 kg     GENERAL:alert, no distress and comfortable SKIN: skin color normal, no rashes or significant lesions EYES: normal,  Conjunctiva are pink and non-injected, sclera clear  NEURO: alert & oriented x 3 with fluent speech  LABORATORY DATA:  I have reviewed the data as listed CBC Latest Ref Rng & Units 10/18/2021 10/11/2021 10/04/2021  WBC 4.0 - 10.5 K/uL 7.4 8.2 8.3  Hemoglobin 13.0 - 17.0 g/dL 8.9(L) 9.1(L) 8.5(L)  Hematocrit 39.0 - 52.0 % 27.2(L) 27.8(L) 26.8(L)  Platelets 150 - 400 K/uL 147(L) 215 204     CMP Latest Ref Rng & Units 10/18/2021 10/11/2021 10/04/2021  Glucose 70 - 99 mg/dL 113(H) 120(H) 97  BUN 8 - 23 mg/dL _0 Creatinine 0.61 - 1.24 mg/dL 3.88(HH) 3.15(HH) 3.38(HH)  Sodium 135 - 145 mmol/L 134(L) 136 134(L)  Potassium 3.5 - 5.1 mmol/L 4.4 3.8 3.7  Chloride 98 - 111 mmol/L 99 99 94(L)  CO2 22 - 32 mmol/L 24 26  31  Calcium 8.9 - 10.3 mg/dL 8.1(L) 8.4(L) 7.9(L)  Total Protein 6.5 - 8.1 g/dL 5.1(L) 5.4(L) 5.9(L)  Total Bilirubin 0.3 - 1.2 mg/dL 0.3 <0.2(L) 0.4  Alkaline Phos 38 - 126 U/L 86 98 87  AST 15 - 41 U/L 8(L) 9(L) 10(L)  ALT 0 - 44 U/L _0 RADIOGRAPHIC STUDIES: I have personally reviewed the radiological images as listed and agreed with the findings in the report. No results found.    No orders of the defined types were placed in this encounter.  All questions were answered. The patient knows to call the clinic with any problems, questions or concerns. No barriers to learning was detected. The total time spent in the appointment was 30 minutes.     Truitt Merle, MD 10/18/2021   I, Wilburn Mylar, am acting as scribe for Truitt Merle, MD.   I have reviewed the above documentation for accuracy and completeness, and I agree with the above.

## 2021-10-18 NOTE — Patient Instructions (Signed)
Please increase your Remeron tablets to 15 mg (TAKE 2 TABLETS) for appetite stimulant.  I have called in Ambien for insomnia. Take at night and hopefully this will allow you to have a restful night in addition to decreasing your steroid dose as directed by Dr. Burr Medico.  I will plan to follow up with you on next week to see how medication adjustments are working for you. Please don't hesitate to call if there is a need prior to next appointment.

## 2021-10-18 NOTE — Progress Notes (Signed)
  Outpatient Palliative Care  [phone]) [phone] ________________________________ Nursing Assessment:  Name: Caleb Simmons        MRN: [id number]  Date of Service: 10/18/2021 DOB: October 30, 1951 Visit Type: Follow-up   Support at Visit: Caleb Simmons, friend  Review of Systems: General: Caleb Simmons stated he feels weak and fatigued. No weight loss. He stated he isn't sleeping well at night. He mentioned that Dr. Burr Medico was going to decrease his steroids to see if this helps.  Neuro: No dizziness, blurred vision, numbness/tingling. Caleb Simmons reports headaches after dialysis treatments.  Cardiac: None Pulmonary: None  GI: 50% normal appetite. Caleb Simmons states he has to force his food down and that it doesn't taste well. He states he tried supplements but wasn't able to drink them.  Regular bowel movements with Miralax. GU: None Integumentary: Caleb Simmons reports "drying out" of feet and hands.  Psychological: Anxiety when coming to medical appts and a little at home.       Pain Review: Scale of 0-10: 0 at visit but an 8/10 at night Location: neck, shoulders, back Description: aching, chronic Frequency: intermittent  Relief: pain medication   Social Review: Living Situation: Lives at home with friend, Caleb Simmons. Stated that she is there to help him with anything he needs.  Support: Caleb Dolin., and wife    Falls in the last 3 months? No  Assistive Device Use? None  Functional Status: Minimal assist  ACP Form Review: Has documents. Caleb Simmons stated he went to the lawyer this past week to have everything finalized and that this has helped his anxiety.    Family/Patient Concerns:   Sleeping  Anxiety   Provider notified of patient concerns. Patient instructed to call with any questions or concerns.

## 2021-10-18 NOTE — Patient Instructions (Signed)
[facility]   ?Discharge Instructions: ?Thank you for choosing [facility] to provide your oncology and hematology care.  ? ?If you have a lab appointment with the Cancer Center, please go directly to the Cancer Center and check in at the registration area. ?  ?Wear comfortable clothing and clothing appropriate for easy access to any Portacath or PICC line.  ? ?We strive to give you quality time with your provider. You may need to reschedule your appointment if you arrive late (15 or more minutes).  Arriving late affects you and other patients whose appointments are after yours.  Also, if you miss three or more appointments without notifying the office, you may be dismissed from the clinic at the provider?s discretion.    ?  ?For prescription refill requests, have your pharmacy contact our office and allow 72 hours for refills to be completed.   ? ?Today you received the following chemotherapy and/or immunotherapy agents: bortezomib and daratumumab-hyaluronidase-fihj    ?  ?To help prevent nausea and vomiting after your treatment, we encourage you to take your nausea medication as directed. ? ?BELOW ARE SYMPTOMS THAT SHOULD BE REPORTED IMMEDIATELY: ?*FEVER GREATER THAN 100.4 F (38 ?C) OR HIGHER ?*CHILLS OR SWEATING ?*NAUSEA AND VOMITING THAT IS NOT CONTROLLED WITH YOUR NAUSEA MEDICATION ?*UNUSUAL SHORTNESS OF BREATH ?*UNUSUAL BRUISING OR BLEEDING ?*URINARY PROBLEMS (pain or burning when urinating, or frequent urination) ?*BOWEL PROBLEMS (unusual diarrhea, constipation, pain near the anus) ?TENDERNESS IN MOUTH AND THROAT WITH OR WITHOUT PRESENCE OF ULCERS (sore throat, sores in mouth, or a toothache) ?UNUSUAL RASH, SWELLING OR PAIN  ?UNUSUAL VAGINAL DISCHARGE OR ITCHING  ? ?Items with * indicate a potential emergency and should be followed up as soon as possible or go to the Emergency Department if any problems should occur. ? ?Please show the CHEMOTHERAPY ALERT CARD or  IMMUNOTHERAPY ALERT CARD at check-in to the Emergency Department and triage nurse. ? ?Should you have questions after your visit or need to cancel or reschedule your appointment, please contact [facility]  Dept: [phone]  and follow the prompts.  Office hours are 8:00 a.m. to 4:30 p.m. Monday - Friday. Please note that voicemails left after 4:00 p.m. may not be returned until the following business day.  We are closed weekends and major holidays. You have access to a nurse at all times for urgent questions. Please call the main number to the clinic Dept: [phone] and follow the prompts. ? ? ?For any non-urgent questions, you may also contact your provider using MyChart. We now offer e-Visits for anyone 18 and older to request care online for non-urgent symptoms. For details visit [email or web address]. ?  ?Also download the MyChart app! Go to the app store, search "MyChart", open the app, select [facility], and log in with your MyChart username and password. ? ?Due to Covid, a mask is required upon entering the hospital/clinic. If you do not have a mask, one will be given to you upon arrival. For doctor visits, patients may have 1 support person aged 18 or older with them. For treatment visits, patients cannot have anyone with them due to current Covid guidelines and our immunocompromised population.  ? ?

## 2021-10-19 ENCOUNTER — Telehealth: Payer: Self-pay

## 2021-10-19 ENCOUNTER — Telehealth: Payer: Self-pay | Admitting: Hematology

## 2021-10-19 NOTE — Telephone Encounter (Signed)
Spoke with pt's son Orene Desanctis at [phone] via telephone to confirm that the pt does not need home health services at this time.  Pt's son confirmed that the pt does not need home health right now d/t pt has a family member living with him to assist with his care.  Pt's son stated they will re-evaluate if the pt's health change or living arrangements change.  Faxed orders to [place], [place].

## 2021-10-19 NOTE — Telephone Encounter (Signed)
Scheduled follow-up appointment per 10/27 los. Patient's daughter-in-law is aware.

## 2021-10-22 ENCOUNTER — Encounter: Payer: Self-pay | Admitting: Hematology

## 2021-10-23 ENCOUNTER — Other Ambulatory Visit: Payer: Self-pay

## 2021-10-23 ENCOUNTER — Encounter (HOSPITAL_COMMUNITY): Payer: Self-pay | Admitting: Cardiology

## 2021-10-23 ENCOUNTER — Ambulatory Visit (HOSPITAL_COMMUNITY)
Admission: RE | Admit: 2021-10-23 | Discharge: 2021-10-23 | Disposition: A | Discharge status: Home / Self Care | Payer: Commercial Managed Care - PPO | Source: Ambulatory Visit | Attending: Cardiology | Admitting: Cardiology

## 2021-10-23 VITALS — BP 84/50 | HR 113 | Wt 162.4 lb

## 2021-10-23 DIAGNOSIS — N186 End stage renal disease: Secondary | ICD-10-CM | POA: Diagnosis not present

## 2021-10-23 DIAGNOSIS — C9030 Solitary plasmacytoma not having achieved remission: Secondary | ICD-10-CM | POA: Insufficient documentation

## 2021-10-23 DIAGNOSIS — I43 Cardiomyopathy in diseases classified elsewhere: Secondary | ICD-10-CM | POA: Diagnosis not present

## 2021-10-23 DIAGNOSIS — Z992 Dependence on renal dialysis: Secondary | ICD-10-CM | POA: Diagnosis not present

## 2021-10-23 DIAGNOSIS — E854 Organ-limited amyloidosis: Secondary | ICD-10-CM | POA: Diagnosis not present

## 2021-10-23 DIAGNOSIS — E8581 Light chain (AL) amyloidosis: Secondary | ICD-10-CM | POA: Insufficient documentation

## 2021-10-23 DIAGNOSIS — Z7983 Long term (current) use of bisphosphonates: Secondary | ICD-10-CM | POA: Insufficient documentation

## 2021-10-23 DIAGNOSIS — I951 Orthostatic hypotension: Secondary | ICD-10-CM | POA: Diagnosis not present

## 2021-10-23 DIAGNOSIS — Z79899 Other long term (current) drug therapy: Secondary | ICD-10-CM | POA: Insufficient documentation

## 2021-10-23 MED ORDER — MIDODRINE HCL 5 MG PO TABS: 5.0000 mg | ORAL_TABLET | Freq: Three times a day (TID) | ORAL | 3 refills | Status: DC

## 2021-10-23 NOTE — Progress Notes (Signed)
PCP: Pcp, No Oncology: Dr. Burr Medico Cardiology: Dr. Aundra Dubin  70 y.o. with minimal past history was admitted in 9/22 with AKI, group B strep bacteremia, and acute CHF.  He had to start on dialysis.  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  Echo showed EF 45-50%, moderate LVH, small pericardial effusion. Cardiac MRI showed EF 38%, moderate LVH, small pericardial effusion, elevated T1 suggestive of cardiac amyloidosis. He was started on chemotherapy with Velcade and Cytoxan, which he continues currently.   Patient remains on dialysis.  He is making urine and hopes that he may be able to eventually come off HD.  His BP has run low, generally in the 80s-90s.  He gets lightheaded ("foggy") with standing.  No falls.  No exertional dyspnea.  No chest pain.  No orthopnea/PND.  He has generalized fatigue.  He takes midodrine prior to HD.    ECG (personally reviewed): sinus tachy 102, low voltage, 1st degree AVB  Labs (10/22): hgb 8.9  PMH: 1. Nephrolithiasis 2. 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  - Treating with Cytoxan and Velcade.  3. Anemia 4. 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  - Cardiac MRI (9/22): EF 38%, moderate LVH, small pericardial effusion, elevated T1 suggestive of cardiac amyloidosis.   6. Orthostatic hypotension.   Social History   Socioeconomic History   Marital status: Married    Spouse name: Not on file   Number of children: Not on file   Years of education: Not on file   Highest education level: Not on file  Occupational History   Not on file  Tobacco Use   Smoking status: Some Days    Packs/day: 0.25    Years: 25.00    Pack years: 6.25    Types: Cigarettes   Smokeless tobacco: Never  Substance and Sexual Activity   Alcohol use: Not on file   Drug use: Not on file    Sexual activity: Not on file  Other Topics Concern   Not on file  Social History Narrative   Not on file   Social Determinants of Health   Financial Resource Strain: Low Risk    Difficulty of Paying Living Expenses: Not very hard  Food Insecurity: No Food Insecurity   Worried About Running Out of Food in the Last Year: Never true   [place] in the Last Year: Never true  Transportation Needs: No Transportation Needs   Lack of Transportation (Medical): No   Lack of Transportation (Non-Medical): No  Physical Activity: Not on file  Stress: Not on file  Social Connections: Not on file  Intimate Partner Violence: Not on file   History reviewed. No pertinent family history.  ROS: All systems reviewed and negative except as per HPI.   Current Outpatient Medications  Medication Sig Dispense Refill   acetaminophen (TYLENOL) 325 MG tablet Take 2 tablets (650 mg total) by mouth every 6 (six) hours as needed for moderate pain (headache).     cyclophosphamide (CYTOXAN) 50 MG capsule Take 9 capsules (450 mg total) by mouth once a week. Take with food to minimize GI upset. Take early in the day and maintain hydration. 36 capsule 1   folic acid (FOLVITE) 1 MG tablet Take 2 tablets (2 mg total) by mouth daily. 60 tablet 0   midodrine (PROAMATINE) 5 MG tablet Take  1 tablet (5 mg total) by mouth 3 (three) times daily with meals. 270 tablet 3   mirtazapine (REMERON) 15 MG tablet Take 1 tablet (15 mg total) by mouth at bedtime.     ondansetron (ZOFRAN) 4 MG tablet Take 1 tablet (4 mg total) by mouth every 6 (six) hours as needed for nausea or vomiting. 20 tablet 0   oxyCODONE (OXY IR/ROXICODONE) 5 MG immediate release tablet Take 1 tablet (5 mg total) by mouth every 6 (six) hours as needed for severe pain. 30 tablet 0   pantoprazole (PROTONIX) 40 MG tablet Take 1 tablet (40 mg total) by mouth 2 (two) times daily before a meal. 60 tablet 0   prochlorperazine (COMPAZINE) 5 MG tablet Take 1 tablet  (5 mg total) by mouth every 6 (six) hours as needed for nausea or vomiting. 30 tablet 0   tamsulosin (FLOMAX) 0.4 MG CAPS capsule Take 1 capsule (0.4 mg total) by mouth daily. 30 capsule 0   zolpidem (AMBIEN) 5 MG tablet Take 1 tablet (5 mg total) by mouth at bedtime as needed for sleep. 30 tablet 0   No current facility-administered medications for this encounter.   BP (!) 84/50   Pulse (!) 113   Wt 73.7 kg (162 lb 6.4 oz)   SpO2 98%   BMI 23.30 kg/m  General: NAD Neck: No JVD, no thyromegaly or thyroid nodule.  Lungs: Clear to auscultation bilaterally with normal respiratory effort. CV: Nondisplaced PMI.  Heart regular S1/S2, no S3/S4, no murmur.  No peripheral edema.  No carotid bruit.  Normal pedal pulses.  Abdomen: Soft, nontender, no hepatosplenomegaly, no distention.  Skin: Intact without lesions or rashes.  Neurologic: Alert and oriented x 3.  Psych: Normal affect. Extremities: No clubbing or cyanosis.  HEENT: Normal.   Assessment/Plan: 1. 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  He has biopsy-proven AL amyloidosis from renal and bone marrow biopsies.  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  EF was low (38%) on cardiac MRI, but GDMT is limited by ESRD and orthostatic hypotension.  Would avoid beta blocker in the setting of cardiac amyloidosis.  He is not volume overloaded on exam.  - Treatment will be chemotherapy for AL amyloidosis => Velcade + Cytoxan.  - Repeat echo in 6 months or so after treatment.  2. 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  - Start midodrine 5 mg tid.  - Add graded compression stockings during the day.  3. ESRD: Suspect due to amyloidosis.  There is some hope that he may be able to come off HD.     10/23/2021  

## 2021-10-23 NOTE — Patient Instructions (Signed)
EKG done today.  No Labs done today.   START Midodrine 5mg  (1 tablet) by mouth 3 times daily.   No other medication changes were made. Please continue all current medications as prescribed.  Please wear your compression hose daily, place them on as soon as you get up in the morning and remove before you go to bed at night. You were provided a prescription for these in office today, you can purchase these at [facility]; [address] [place], [place] [address] [phone]  Your physician recommends that you schedule a follow-up appointment in: 3 months with our NP/PA Clinic here in our office   If you have any questions or concerns before your next appointment please send [place] a message through mychart or call our office at [phone].    TO LEAVE A MESSAGE FOR THE NURSE SELECT OPTION 2, PLEASE LEAVE A MESSAGE INCLUDING: YOUR NAME DATE OF BIRTH CALL BACK NUMBER REASON FOR CALL**this is important as we prioritize the call backs  YOU WILL RECEIVE A CALL BACK THE SAME DAY AS LONG AS YOU CALL BEFORE 4:00 PM   Do the following things EVERYDAY: Weigh yourself in the morning before breakfast. Write it down and keep it in a log. Take your medicines as prescribed Eat low salt foods--Limit salt (sodium) to 2000 mg per day.  Stay as active as you can everyday Limit all fluids for the day to less than 2 liters   At the [facility], you and your health needs are our priority. As part of our continuing mission to provide you with exceptional heart care, we have created designated Provider Care Teams. These Care Teams include your primary Cardiologist (physician) and Advanced Practice Providers (APPs- Physician Assistants and Nurse Practitioners) who all work together to provide you with the care you need, when you need it.   You may see any of the following providers on your designated Care Team at your next follow up: Dr Glori Bickers Dr Haynes Kerns, NP Lyda Jester, [place] Audry Riles, PharmD   Please be sure to bring in all your medications bottles to every appointment.

## 2021-10-25 ENCOUNTER — Inpatient Hospital Stay (HOSPITAL_BASED_OUTPATIENT_CLINIC_OR_DEPARTMENT_OTHER): Payer: Commercial Managed Care - PPO | Admitting: Nurse Practitioner

## 2021-10-25 ENCOUNTER — Other Ambulatory Visit: Payer: Self-pay | Admitting: Hematology

## 2021-10-25 ENCOUNTER — Encounter: Payer: Self-pay | Admitting: Hematology

## 2021-10-25 ENCOUNTER — Inpatient Hospital Stay: Payer: Commercial Managed Care - PPO | Attending: Hematology

## 2021-10-25 ENCOUNTER — Other Ambulatory Visit: Payer: Self-pay

## 2021-10-25 ENCOUNTER — Inpatient Hospital Stay: Payer: Commercial Managed Care - PPO

## 2021-10-25 ENCOUNTER — Ambulatory Visit: Payer: Commercial Managed Care - PPO | Admitting: Hematology

## 2021-10-25 VITALS — HR 99

## 2021-10-25 VITALS — BP 93/58 | HR 101 | Temp 97.0°F | Resp 18 | Wt 159.5 lb

## 2021-10-25 DIAGNOSIS — Z515 Encounter for palliative care: Secondary | ICD-10-CM

## 2021-10-25 DIAGNOSIS — I951 Orthostatic hypotension: Secondary | ICD-10-CM | POA: Insufficient documentation

## 2021-10-25 DIAGNOSIS — Z5112 Encounter for antineoplastic immunotherapy: Secondary | ICD-10-CM | POA: Insufficient documentation

## 2021-10-25 DIAGNOSIS — E8581 Light chain (AL) amyloidosis: Secondary | ICD-10-CM | POA: Insufficient documentation

## 2021-10-25 DIAGNOSIS — Z992 Dependence on renal dialysis: Secondary | ICD-10-CM | POA: Diagnosis not present

## 2021-10-25 DIAGNOSIS — C9000 Multiple myeloma not having achieved remission: Secondary | ICD-10-CM | POA: Diagnosis present

## 2021-10-25 DIAGNOSIS — D631 Anemia in chronic kidney disease: Secondary | ICD-10-CM | POA: Diagnosis not present

## 2021-10-25 DIAGNOSIS — N186 End stage renal disease: Secondary | ICD-10-CM | POA: Diagnosis not present

## 2021-10-25 LAB — COMPREHENSIVE METABOLIC PANEL
ALT: 6 U/L (ref 0–44)
AST: 10 U/L — ABNORMAL LOW (ref 15–41)
Albumin: 2.4 g/dL — ABNORMAL LOW (ref 3.5–5.0)
Alkaline Phosphatase: 95 U/L (ref 38–126)
Anion gap: 10 (ref 5–15)
BUN: 15 mg/dL (ref 8–23)
CO2: 25 mmol/L (ref 22–32)
Calcium: 8.1 mg/dL — ABNORMAL LOW (ref 8.9–10.3)
Chloride: 102 mmol/L (ref 98–111)
Creatinine, Ser: 3.42 mg/dL (ref 0.61–1.24)
GFR, Estimated: 18 mL/min — ABNORMAL LOW (ref >60)
Glucose, Bld: 101 mg/dL — ABNORMAL HIGH (ref 70–99)
Potassium: 4.1 mmol/L (ref 3.5–5.1)
Sodium: 137 mmol/L (ref 135–145)
Total Bilirubin: 0.4 mg/dL (ref 0.3–1.2)
Total Protein: 5.3 g/dL — ABNORMAL LOW (ref 6.5–8.1)

## 2021-10-25 LAB — CBC WITH DIFFERENTIAL/PLATELET
Abs Immature Granulocytes: 0.02 10*3/uL (ref 0.00–0.07)
Basophils Absolute: 0.1 10*3/uL (ref 0.0–0.1)
Basophils Relative: 1 %
Eosinophils Absolute: 0.1 10*3/uL (ref 0.0–0.5)
Eosinophils Relative: 2 %
HCT: 29.2 % — ABNORMAL LOW (ref 39.0–52.0)
Hemoglobin: 9.4 g/dL — ABNORMAL LOW (ref 13.0–17.0)
Immature Granulocytes: 0 %
Lymphocytes Relative: 23 %
Lymphs Abs: 1.3 10*3/uL (ref 0.7–4.0)
MCH: 28.8 pg (ref 26.0–34.0)
MCHC: 32.2 g/dL (ref 30.0–36.0)
MCV: 89.6 fL (ref 80.0–100.0)
Monocytes Absolute: 0.6 10*3/uL (ref 0.1–1.0)
Monocytes Relative: 11 %
Neutro Abs: 3.5 10*3/uL (ref 1.7–7.7)
Neutrophils Relative %: 63 %
Platelets: 173 10*3/uL (ref 150–400)
RBC: 3.26 MIL/uL — ABNORMAL LOW (ref 4.22–5.81)
RDW: 20.8 % — ABNORMAL HIGH (ref 11.5–15.5)
WBC: 5.6 10*3/uL (ref 4.0–10.5)
nRBC: 0 % (ref 0.0–0.2)

## 2021-10-25 MED ORDER — ACETAMINOPHEN 325 MG PO TABS
650.0000 mg | ORAL_TABLET | Freq: Once | ORAL | Status: AC
  Administered 2021-10-25: 650 mg via ORAL
  Filled 2021-10-25: qty 2

## 2021-10-25 MED ORDER — DARATUMUMAB-HYALURONIDASE-FIHJ 1800-30000 MG-UT/15ML ~~LOC~~ SOLN
1800.0000 mg | Freq: Once | SUBCUTANEOUS | Status: AC
  Administered 2021-10-25: 1800 mg via SUBCUTANEOUS
  Filled 2021-10-25: qty 15

## 2021-10-25 MED ORDER — DIPHENHYDRAMINE HCL 25 MG PO CAPS
50.0000 mg | ORAL_CAPSULE | Freq: Once | ORAL | Status: AC
  Administered 2021-10-25: 50 mg via ORAL
  Filled 2021-10-25: qty 2

## 2021-10-25 MED ORDER — BORTEZOMIB CHEMO SQ INJECTION 3.5 MG (2.5MG/ML)
1.3000 mg/m2 | Freq: Once | INTRAMUSCULAR | Status: AC
  Administered 2021-10-25: 2.5 mg via SUBCUTANEOUS
  Filled 2021-10-25: qty 1

## 2021-10-25 MED ORDER — DEXAMETHASONE 4 MG PO TABS
20.0000 mg | ORAL_TABLET | Freq: Once | ORAL | Status: AC
  Administered 2021-10-25: 20 mg via ORAL
  Filled 2021-10-25: qty 5

## 2021-10-25 NOTE — Patient Instructions (Addendum)
[facility]  Discharge Instructions: Thank you for choosing [facility] to provide your oncology and hematology care.   If you have a lab appointment with the [facility], please go directly to the [facility] and check in at the registration area.   Wear comfortable clothing and clothing appropriate for easy access to any Portacath or PICC line.   We strive to give you quality time with your provider. You may need to reschedule your appointment if you arrive late (15 or more minutes).  Arriving late affects you and other patients whose appointments are after yours.  Also, if you miss three or more appointments without notifying the office, you may be dismissed from the clinic at the provider's discretion.      For prescription refill requests, have your pharmacy contact our office and allow 72 hours for refills to be completed.    Today you received the following chemotherapy and/or immunotherapy agents: Velcade & Darzalex Faspro    To help prevent nausea and vomiting after your treatment, we encourage you to take your nausea medication as directed.  BELOW ARE SYMPTOMS THAT SHOULD BE REPORTED IMMEDIATELY: *FEVER GREATER THAN 100.4 F (38 C) OR HIGHER *CHILLS OR SWEATING *NAUSEA AND VOMITING THAT IS NOT CONTROLLED WITH YOUR NAUSEA MEDICATION *UNUSUAL SHORTNESS OF BREATH *UNUSUAL BRUISING OR BLEEDING *URINARY PROBLEMS (pain or burning when urinating, or frequent urination) *BOWEL PROBLEMS (unusual diarrhea, constipation, pain near the anus) TENDERNESS IN MOUTH AND THROAT WITH OR WITHOUT PRESENCE OF ULCERS (sore throat, sores in mouth, or a toothache) UNUSUAL RASH, SWELLING OR PAIN  UNUSUAL VAGINAL DISCHARGE OR ITCHING   Items with * indicate a potential emergency and should be followed up as soon as possible or go to the Emergency Department if any problems should occur.  Please show the CHEMOTHERAPY ALERT CARD or IMMUNOTHERAPY ALERT CARD  at check-in to the Emergency Department and triage nurse.  Should you have questions after your visit or need to cancel or reschedule your appointment, please contact [facility]  Dept: [phone]  and follow the prompts.  Office hours are 8:00 a.m. to 4:30 p.m. Monday - Friday. Please note that voicemails left after 4:00 p.m. may not be returned until the following business day.  We are closed weekends and major holidays. You have access to a nurse at all times for urgent questions. Please call the main number to the clinic Dept: [phone] and follow the prompts.   For any non-urgent questions, you may also contact your provider using MyChart. We now offer e-Visits for anyone 7 and older to request care online for non-urgent symptoms. For details visit [email or web address].   Also download the MyChart app! Go to the app store, search "MyChart", open the app, select , and log in with your MyChart username and password.  Due to Covid, a mask is required upon entering the hospital/clinic. If you do not have a mask, one will be given to you upon arrival. For doctor visits, patients may have 1 support person aged 77 or older with them. For treatment visits, patients cannot have anyone with them due to current Covid guidelines and our immunocompromised population.   Patient took Cytoxan dose while in infusion today.

## 2021-10-25 NOTE — Progress Notes (Signed)
This RN observed Pt take prescribed PO dose of Cytoxan. Pt took correct dose of 9 pills (450 mg) by mouth at 14:30 today while in infusion.

## 2021-10-25 NOTE — Progress Notes (Signed)
  Outpatient Palliative Care  [phone] ________________________________ Nursing Assessment:  Name: Caleb Simmons        MRN: [id number]  Date of Service: 10/25/2021 DOB: 1951-10-07 Visit Type: Follow up on decreased appetite, sleeping, and anxiety  Support at Visit: Unaccompanied   Review of Systems: General: [name] stated he feels very weak today. States he doesn't feel tired, just weak. Has lost 3lbs.  [name] stated his sleep is getting "a little better". He said the medicine may be helping and wants to continue to take it another week to see if this continues to improve.   Neuro: [name] said he does develop headaches after dialysis treatment due to being laid in trendelenburg position.  He reports blurry vision when BP drops. He states he elevates his legs and this helps.  Cardiac: None Pulmonary: None  GI: [name] states his appetite is at 50% normal and that the medicine seems to be helping. He states he "eats as he can".  No N/V GU: None Integumentary: None  Psychological: [name] states that he is still anxious, rated at an 37/10. He states this occurs before any doctor's appts. Caleb Baton, NP notified.       Pain Review: Scale of 0-10: 0/No pain Location: Description: Frequency: Relief:   Social Review: Living Situation: Pt lives with friend Support: Pt's friend, Caleb Simmons. (Son), and wife    Falls in the last 3 months? No  Assistive Device Use? No  Functional Status: Independent at home, has friend to help if needed  ACP Form Review: Has forms in [place]   Family/Patient Concerns: Patient concerned about anxiety before appts and wondering if there is something he could take before these appointments to ease his anxiety. Caleb Baton, NP notified.   Provider notified of assessment and patient/family concerns. Patient instructed to call with any questions or concerns.

## 2021-10-25 NOTE — Progress Notes (Signed)
[facility]  Telephone:([phone] [phone]) [phone]   Name: Caleb Simmons Date: 10/25/2021 MRN: [id number]  DOB: 21-Sep-1951  Patient Care Team: Pcp, No as PCP - General Buntin, Lavonda Jumbo, RN as Registered Nurse     Caleb Simmons is a 70 y.o. 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  Palliative visit post-hospital follow-up for ongoing goals of care discussions and symptom management.   SOCIAL HISTORY:     reports that he has been smoking cigarettes. He has a 6.25 pack-year smoking history. He has never used smokeless tobacco.  ADVANCE DIRECTIVES:  Patient reports completed document.  Son Doyal, Saric. is his healthcare power of attorney.  CODE STATUS: Full code  PAST MEDICAL HISTORY:No past medical history on file.  PAST SURGICAL HISTORY:  Past Surgical History:  Procedure Laterality Date   APPENDECTOMY     IR EMBO ART  VEN HEMORR LYMPH EXTRAV  INC GUIDE ROADMAPPING  08/30/2021   IR FLUORO GUIDE CV LINE RIGHT  08/30/2021   IR FLUORO GUIDE CV LINE RIGHT  09/18/2021   IR RENAL SELECTIVE  UNI INC S&I MOD SED  08/31/2021   IR US GUIDE VASC ACCESS RIGHT  08/30/2021   IR US GUIDE VASC ACCESS RIGHT  08/30/2021   IR US GUIDE VASC ACCESS RIGHT  09/18/2021    HEMATOLOGY/ONCOLOGY HISTORY:  Oncology History   No history exists.    ALLERGIES:  has No Known Allergies.  MEDICATIONS:  Current Outpatient Medications  Medication Sig Dispense Refill   acetaminophen (TYLENOL) 325 MG tablet Take 2 tablets (650 mg total) by mouth every 6 (six) hours as needed for moderate pain (headache).     cyclophosphamide (CYTOXAN) 50 MG capsule Take 9 capsules (450 mg total) by mouth once a week. Take with food to minimize GI upset. Take early in the day and maintain hydration. 36 capsule 1   folic acid (FOLVITE) 1 MG tablet Take 2 tablets  (2 mg total) by mouth daily. 60 tablet 0   midodrine (PROAMATINE) 5 MG tablet Take 1 tablet (5 mg total) by mouth 3 (three) times daily with meals. 270 tablet 3   mirtazapine (REMERON) 15 MG tablet Take 1 tablet (15 mg total) by mouth at bedtime.     ondansetron (ZOFRAN) 4 MG tablet Take 1 tablet (4 mg total) by mouth every 6 (six) hours as needed for nausea or vomiting. 20 tablet 0   oxyCODONE (OXY IR/ROXICODONE) 5 MG immediate release tablet Take 1 tablet (5 mg total) by mouth every 6 (six) hours as needed for severe pain. 30 tablet 0   pantoprazole (PROTONIX) 40 MG tablet Take 1 tablet (40 mg total) by mouth 2 (two) times daily before a meal. 60 tablet 0   prochlorperazine (COMPAZINE) 5 MG tablet Take 1 tablet (5 mg total) by mouth every 6 (six) hours as needed for nausea or vomiting. 30 tablet 0   tamsulosin (FLOMAX) 0.4 MG CAPS capsule Take 1 capsule (0.4 mg total) by mouth daily. 30 capsule 0   zolpidem (AMBIEN) 5 MG tablet Take 1 tablet (5 mg total) by mouth at bedtime as needed for sleep. 30 tablet 0   No current facility-administered medications for this visit.    VITAL SIGNS: BP (!) 93/58 (BP Location: Left Arm, Patient Position: Sitting)   Pulse (!) 101   Temp (!) 97 F (36.1 C) (  Oral)   Resp 18   Wt 159 lb 8 oz (72.3 kg)   SpO2 100%   BMI 22.89 kg/m  Filed Weights   10/25/21 1245  Weight: 159 lb 8 oz (72.3 kg)    Estimated body mass index is 22.89 kg/m as calculated from the following:   Height as of 10/18/21: 5\' 10"  (1.778 m).   Weight as of this encounter: 159 lb 8 oz (72.3 kg).  LABS: CBC:    Component Value Date/Time   WBC 5.6 10/25/2021 1207   HGB 9.4 (L) 10/25/2021 1207   HGB 9.1 (L) 09/27/2021 0733   HCT 29.2 (L) 10/25/2021 1207   PLT 173 10/25/2021 1207   PLT 223 09/27/2021 0733   MCV 89.6 10/25/2021 1207   NEUTROABS 3.5 10/25/2021 1207   LYMPHSABS 1.3 10/25/2021 1207   MONOABS 0.6 10/25/2021 1207   EOSABS 0.1 10/25/2021 1207   BASOSABS 0.1  10/25/2021 1207   Comprehensive Metabolic Panel:    Component Value Date/Time   NA 137 10/25/2021 1207   K 4.1 10/25/2021 1207   CL 102 10/25/2021 1207   CO2 25 10/25/2021 1207   BUN 15 10/25/2021 1207   CREATININE 3.42 (HH) 10/25/2021 1207   CREATININE 3.38 (HH) 10/04/2021 1201   GLUCOSE 101 (H) 10/25/2021 1207   CALCIUM 8.1 (L) 10/25/2021 1207   AST 10 (L) 10/25/2021 1207   AST 10 (L) 10/04/2021 1201   ALT 6 10/25/2021 1207   ALT 8 10/04/2021 1201   ALKPHOS 95 10/25/2021 1207   BILITOT 0.4 10/25/2021 1207   BILITOT 0.4 10/04/2021 1201   PROT 5.3 (L) 10/25/2021 1207   ALBUMIN 2.4 (L) 10/25/2021 1207    RADIOGRAPHIC STUDIES: No results found.  PERFORMANCE STATUS (ECOG) : 3 - Symptomatic, >50% confined to bed  Review of Systems  Constitutional:  Positive for fatigue.  Unless otherwise noted, a complete review of systems is negative.  Physical Exam General: NAD Cardiovascular: regular rate and rhythm, hypotensive  Pulmonary: clear ant fields Abdomen: soft, nontender, + bowel sounds Extremities: no edema, no joint deformities Skin: no rashes Neurological: AAOx3, mood appropriate   IMPRESSION:  Mr. Lundberg is by himself today during visit. No acute distress noted. States he is feeling good outside of occasional fatigue. Expresses his hopes to discontinue dialysis or decrease frequency in the near future as he doesn't like being in trendelenburg position for 3hrs which causes him to have a headache at times. Is taking all medications as prescribed. Wearing compression stockings as advised by Cardiologist.   We discussed his nutritional intake at length. He feels as though it is improving but not significantly. Continues to drink ensure for additional protein and has to force eat at times because he knows he needs the nutrition. He is taking the Remeron as ordered (15mg ) we discussed increasing the dosage vs trying a different medication. He wishes to continue with same dosing  and give it some more time as he has noticed some improvement. Wishes to re-evaluate at future visit. Continued education provided on small frequent meals when he does have hunger sensation in addition to high protein foods.   Patient reports pain is much better and managed effectively with Oxy IR. Reports taking throughout the day and at night but not required daily. Which he is appreciative of.   He is sleeping much better. We started him on Ambien which he is taking and seems to be tolerating well. No adverse side effects and feels rested when he wakes  up in the morning. Constipation is resolved and he endorses taking Miralax daily.   Neng states he continues to have quite of bit of anxiety which he refers to "white coat" syndrome. He is able to manage on most days however on dialysis days and when he has appointments for his treatment he becomes anxious with nervous hand shaking. This is not new for him but has seemed to escalate over the past several months with all of the significant diagnosis and required medical treatments. He denies feelings of depression. He would like to try something for his anxiety if possible sharing he was taking Xanax previously which helped. He does not intend on taking daily but on an as needed basis in correlation with certain medical appointments. Discussed hypotension, use of Remeron and signs of when to contact medical provider.   Mr. Vasil is appreciative of his current quality of life emphasizing that he is feeling more like himself over the past 1-2 weeks. He knows his health conditions continue to be a challenge however he is remaining hopeful for continued stability.   All questions answered and support provided.   PLAN: Continue Miralax daily for constipation. He reports this is much improved.  Continue Oxy IR as needed for pain. Reports this is effective.  Continue Remeron daily for appetite stimulant. Feels appetite has improved some but hopeful for  continued increase. States he is focusing on eating at least 50% of all meals.  Continue Ambien at night for insomnia.  Encouraged deep breathing and other mind stimulating activities to refocus and minimize anxiety. If no relief will consider use of xanax daily as needed.    Patient expressed understanding and was in agreement with this plan. He also understands that He can call the clinic at any time with any questions, concerns, or complaints.     Time Total: 40 min.   Visit consisted of counseling and education dealing with the complex and emotionally intense issues of symptom management and palliative care in the setting of serious and potentially life-threatening illness.Greater than 50%  of this time was spent counseling and coordinating care related to the above assessment and plan.  Signed by: Alda Lea, AGPCNP-BC Palliative Medicine Team

## 2021-10-25 NOTE — Progress Notes (Signed)
OK to trt w/ elevated SrCr and BP of 93/58

## 2021-10-26 ENCOUNTER — Telehealth: Payer: Self-pay | Admitting: Nurse Practitioner

## 2021-10-26 ENCOUNTER — Other Ambulatory Visit: Payer: Self-pay

## 2021-10-26 MED ORDER — TAMSULOSIN HCL 0.4 MG PO CAPS: 0.4000 mg | ORAL_CAPSULE | Freq: Every day | ORAL | 0 refills | Status: DC

## 2021-10-26 MED ORDER — PANTOPRAZOLE SODIUM 40 MG PO TBEC
40.0000 mg | DELAYED_RELEASE_TABLET | Freq: Two times a day (BID) | ORAL | 0 refills | Status: DC
Start: 2021-10-26 — End: 2021-11-13

## 2021-10-26 MED ORDER — FOLIC ACID 1 MG PO TABS: 2.0000 mg | ORAL_TABLET | Freq: Every day | ORAL | 0 refills | Status: DC

## 2021-10-26 NOTE — Telephone Encounter (Signed)
Scheduled per 11/3 los, attempted to call pt, was unable to leave message, will try back later

## 2021-11-01 ENCOUNTER — Inpatient Hospital Stay: Payer: Commercial Managed Care - PPO

## 2021-11-01 ENCOUNTER — Encounter: Payer: Self-pay | Admitting: Hematology

## 2021-11-01 ENCOUNTER — Other Ambulatory Visit: Payer: Self-pay

## 2021-11-01 VITALS — BP 122/74 | HR 87 | Temp 98.1°F | Resp 18 | Ht 70.0 in | Wt 161.8 lb

## 2021-11-01 DIAGNOSIS — Z5112 Encounter for antineoplastic immunotherapy: Secondary | ICD-10-CM | POA: Diagnosis not present

## 2021-11-01 DIAGNOSIS — E8581 Light chain (AL) amyloidosis: Secondary | ICD-10-CM

## 2021-11-01 LAB — CBC WITH DIFFERENTIAL/PLATELET
Abs Immature Granulocytes: 0.02 10*3/uL (ref 0.00–0.07)
Basophils Absolute: 0.1 10*3/uL (ref 0.0–0.1)
Basophils Relative: 1 %
Eosinophils Absolute: 0.1 10*3/uL (ref 0.0–0.5)
Eosinophils Relative: 2 %
HCT: 30.8 % — ABNORMAL LOW (ref 39.0–52.0)
Hemoglobin: 9.7 g/dL — ABNORMAL LOW (ref 13.0–17.0)
Immature Granulocytes: 0 %
Lymphocytes Relative: 21 %
Lymphs Abs: 1.2 10*3/uL (ref 0.7–4.0)
MCH: 29 pg (ref 26.0–34.0)
MCHC: 31.5 g/dL (ref 30.0–36.0)
MCV: 92.2 fL (ref 80.0–100.0)
Monocytes Absolute: 0.5 10*3/uL (ref 0.1–1.0)
Monocytes Relative: 8 %
Neutro Abs: 3.8 10*3/uL (ref 1.7–7.7)
Neutrophils Relative %: 68 %
Platelets: 167 10*3/uL (ref 150–400)
RBC: 3.34 MIL/uL — ABNORMAL LOW (ref 4.22–5.81)
RDW: 21.1 % — ABNORMAL HIGH (ref 11.5–15.5)
WBC: 5.7 10*3/uL (ref 4.0–10.5)
nRBC: 0 % (ref 0.0–0.2)

## 2021-11-01 LAB — COMPREHENSIVE METABOLIC PANEL
ALT: 5 U/L (ref 0–44)
AST: 9 U/L — ABNORMAL LOW (ref 15–41)
Albumin: 2.3 g/dL — ABNORMAL LOW (ref 3.5–5.0)
Alkaline Phosphatase: 90 U/L (ref 38–126)
Anion gap: 11 (ref 5–15)
BUN: 19 mg/dL (ref 8–23)
CO2: 25 mmol/L (ref 22–32)
Calcium: 7.6 mg/dL — ABNORMAL LOW (ref 8.9–10.3)
Chloride: 102 mmol/L (ref 98–111)
Creatinine, Ser: 3.32 mg/dL (ref 0.61–1.24)
GFR, Estimated: 19 mL/min — ABNORMAL LOW (ref >60)
Glucose, Bld: 93 mg/dL (ref 70–99)
Potassium: 3.8 mmol/L (ref 3.5–5.1)
Sodium: 138 mmol/L (ref 135–145)
Total Bilirubin: 0.3 mg/dL (ref 0.3–1.2)
Total Protein: 5.2 g/dL — ABNORMAL LOW (ref 6.5–8.1)

## 2021-11-01 MED ORDER — DEXAMETHASONE 4 MG PO TABS
20.0000 mg | ORAL_TABLET | Freq: Once | ORAL | Status: AC
  Administered 2021-11-01: 20 mg via ORAL
  Filled 2021-11-01: qty 5

## 2021-11-01 MED ORDER — DIPHENHYDRAMINE HCL 25 MG PO CAPS
50.0000 mg | ORAL_CAPSULE | Freq: Once | ORAL | Status: AC
  Administered 2021-11-01: 50 mg via ORAL
  Filled 2021-11-01: qty 2

## 2021-11-01 MED ORDER — DARATUMUMAB-HYALURONIDASE-FIHJ 1800-30000 MG-UT/15ML ~~LOC~~ SOLN
1800.0000 mg | Freq: Once | SUBCUTANEOUS | Status: AC
  Administered 2021-11-01: 1800 mg via SUBCUTANEOUS
  Filled 2021-11-01: qty 15

## 2021-11-01 MED ORDER — ACETAMINOPHEN 325 MG PO TABS
650.0000 mg | ORAL_TABLET | Freq: Once | ORAL | Status: AC
  Administered 2021-11-01: 650 mg via ORAL
  Filled 2021-11-01: qty 2

## 2021-11-01 MED ORDER — BORTEZOMIB CHEMO SQ INJECTION 3.5 MG (2.5MG/ML)
1.3000 mg/m2 | Freq: Once | INTRAMUSCULAR | Status: AC
  Administered 2021-11-01: 2.5 mg via SUBCUTANEOUS
  Filled 2021-11-01: qty 1

## 2021-11-01 NOTE — Progress Notes (Signed)
Oral cytoxan verified by second nurse, Dow Adolph. Patient confirmed to have taken appropriate dose via appropriate route following administration of pre-medications.

## 2021-11-01 NOTE — Progress Notes (Signed)
CRITICAL VALUE STICKER  CRITICAL VALUE: Creatinine: 3.32  RECEIVER (on-site recipient of call): Patty Sermons, RN  DATE & TIME NOTIFIED: 11/01/2021 @ 1535  MESSENGER (representative from lab): Rosann Auerbach  MD NOTIFIED: Dr. Burr Medico  TIME OF NOTIFICATION: 11/01/2021 @ 1538  RESPONSE:  Okay to proceed with treatment, per Dr. Burr Medico. Patient is receiving dialysis treatments.

## 2021-11-01 NOTE — Patient Instructions (Signed)
[facility] ONCOLOGY   Discharge Instructions: Thank you for choosing [facility] to provide your oncology and hematology care.   If you have a lab appointment with the [facility], please go directly to the [facility] and check in at the registration area.   Wear comfortable clothing and clothing appropriate for easy access to any Portacath or PICC line.   We strive to give you quality time with your provider. You may need to reschedule your appointment if you arrive late (15 or more minutes).  Arriving late affects you and other patients whose appointments are after yours.  Also, if you miss three or more appointments without notifying the office, you may be dismissed from the clinic at the provider's discretion.      For prescription refill requests, have your pharmacy contact our office and allow 72 hours for refills to be completed.    Today you received the following chemotherapy and/or immunotherapy agents: Cyclophosphamide (Cytoxan), Bortezomib (Velcade), and Daratumumab hyaluronidase (Darzalex faspro)      To help prevent nausea and vomiting after your treatment, we encourage you to take your nausea medication as directed.  BELOW ARE SYMPTOMS THAT SHOULD BE REPORTED IMMEDIATELY: *FEVER GREATER THAN 100.4 F (38 C) OR HIGHER *CHILLS OR SWEATING *NAUSEA AND VOMITING THAT IS NOT CONTROLLED WITH YOUR NAUSEA MEDICATION *UNUSUAL SHORTNESS OF BREATH *UNUSUAL BRUISING OR BLEEDING *URINARY PROBLEMS (pain or burning when urinating, or frequent urination) *BOWEL PROBLEMS (unusual diarrhea, constipation, pain near the anus) TENDERNESS IN MOUTH AND THROAT WITH OR WITHOUT PRESENCE OF ULCERS (sore throat, sores in mouth, or a toothache) UNUSUAL RASH, SWELLING OR PAIN  UNUSUAL VAGINAL DISCHARGE OR ITCHING   Items with * indicate a potential emergency and should be followed up as soon as possible or go to the Emergency Department if any problems should  occur.  Please show the CHEMOTHERAPY ALERT CARD or IMMUNOTHERAPY ALERT CARD at check-in to the Emergency Department and triage nurse.  Should you have questions after your visit or need to cancel or reschedule your appointment, please contact [facility]  Dept: [phone]  and follow the prompts.  Office hours are 8:00 a.m. to 4:30 p.m. Monday - Friday. Please note that voicemails left after 4:00 p.m. may not be returned until the following business day.  We are closed weekends and major holidays. You have access to a nurse at all times for urgent questions. Please call the main number to the clinic Dept: [phone] and follow the prompts.   For any non-urgent questions, you may also contact your provider using MyChart. We now offer e-Visits for anyone 67 and older to request care online for non-urgent symptoms. For details visit [email or web address].   Also download the MyChart app! Go to the app store, search "MyChart", open the app, select [facility], and log in with your MyChart username and password.  Due to Covid, a mask is required upon entering the hospital/clinic. If you do not have a mask, one will be given to you upon arrival. For doctor visits, patients may have 1 support person aged 57 or older with them. For treatment visits, patients cannot have anyone with them due to current Covid guidelines and our immunocompromised population.

## 2021-11-03 LAB — KAPPA/LAMBDA LIGHT CHAINS

## 2021-11-07 LAB — MULTIPLE MYELOMA PANEL, SERUM
Albumin SerPl Elph-Mcnc: 2.4 g/dL — ABNORMAL LOW (ref 2.9–4.4)
Albumin/Glob SerPl: 1.2 (ref 0.7–1.7)
Alpha 1: 0.3 g/dL (ref 0.0–0.4)
Alpha2 Glob SerPl Elph-Mcnc: 0.8 g/dL (ref 0.4–1.0)
B-Globulin SerPl Elph-Mcnc: 0.7 g/dL (ref 0.7–1.3)
Gamma Glob SerPl Elph-Mcnc: 0.3 g/dL — ABNORMAL LOW (ref 0.4–1.8)
Globulin, Total: 2.1 g/dL — ABNORMAL LOW (ref 2.2–3.9)
IgA: 45 mg/dL — ABNORMAL LOW (ref 61–437)
IgG (Immunoglobin G), Serum: 413 mg/dL — ABNORMAL LOW (ref 603–1613)
IgM (Immunoglobulin M), Srm: 57 mg/dL (ref 15–143)
M Protein SerPl Elph-Mcnc: 0.1 g/dL — ABNORMAL HIGH
Total Protein ELP: 4.5 g/dL — ABNORMAL LOW (ref 6.0–8.5)

## 2021-11-08 ENCOUNTER — Other Ambulatory Visit (HOSPITAL_COMMUNITY): Payer: Self-pay

## 2021-11-08 ENCOUNTER — Inpatient Hospital Stay: Payer: Commercial Managed Care - PPO

## 2021-11-08 ENCOUNTER — Inpatient Hospital Stay (HOSPITAL_BASED_OUTPATIENT_CLINIC_OR_DEPARTMENT_OTHER): Payer: Commercial Managed Care - PPO | Admitting: Hematology

## 2021-11-08 ENCOUNTER — Inpatient Hospital Stay (HOSPITAL_BASED_OUTPATIENT_CLINIC_OR_DEPARTMENT_OTHER): Payer: Commercial Managed Care - PPO | Admitting: Nurse Practitioner

## 2021-11-08 ENCOUNTER — Other Ambulatory Visit: Payer: Self-pay

## 2021-11-08 VITALS — BP 102/63 | HR 76 | Resp 17 | Wt 163.2 lb

## 2021-11-08 DIAGNOSIS — G4700 Insomnia, unspecified: Secondary | ICD-10-CM

## 2021-11-08 DIAGNOSIS — F419 Anxiety disorder, unspecified: Secondary | ICD-10-CM

## 2021-11-08 DIAGNOSIS — E8581 Light chain (AL) amyloidosis: Secondary | ICD-10-CM

## 2021-11-08 DIAGNOSIS — Z5112 Encounter for antineoplastic immunotherapy: Secondary | ICD-10-CM | POA: Diagnosis not present

## 2021-11-08 DIAGNOSIS — K5900 Constipation, unspecified: Secondary | ICD-10-CM

## 2021-11-08 DIAGNOSIS — Z515 Encounter for palliative care: Secondary | ICD-10-CM | POA: Diagnosis not present

## 2021-11-08 LAB — CBC WITH DIFFERENTIAL/PLATELET
Abs Immature Granulocytes: 0.03 10*3/uL (ref 0.00–0.07)
Basophils Absolute: 0.1 10*3/uL (ref 0.0–0.1)
Basophils Relative: 1 %
Eosinophils Absolute: 0.1 10*3/uL (ref 0.0–0.5)
Eosinophils Relative: 1 %
HCT: 32.7 % — ABNORMAL LOW (ref 39.0–52.0)
Hemoglobin: 10.3 g/dL — ABNORMAL LOW (ref 13.0–17.0)
Immature Granulocytes: 0 %
Lymphocytes Relative: 14 %
Lymphs Abs: 1 10*3/uL (ref 0.7–4.0)
MCH: 28.7 pg (ref 26.0–34.0)
MCHC: 31.5 g/dL (ref 30.0–36.0)
MCV: 91.1 fL (ref 80.0–100.0)
Monocytes Absolute: 0.5 10*3/uL (ref 0.1–1.0)
Monocytes Relative: 7 %
Neutro Abs: 5.6 10*3/uL (ref 1.7–7.7)
Neutrophils Relative %: 77 %
Platelets: 155 10*3/uL (ref 150–400)
RBC: 3.59 MIL/uL — ABNORMAL LOW (ref 4.22–5.81)
RDW: 19.9 % — ABNORMAL HIGH (ref 11.5–15.5)
WBC: 7.2 10*3/uL (ref 4.0–10.5)
nRBC: 0 % (ref 0.0–0.2)

## 2021-11-08 LAB — COMPREHENSIVE METABOLIC PANEL
ALT: 6 U/L (ref 0–44)
AST: 10 U/L — ABNORMAL LOW (ref 15–41)
Albumin: 2.4 g/dL — ABNORMAL LOW (ref 3.5–5.0)
Alkaline Phosphatase: 92 U/L (ref 38–126)
Anion gap: 9 (ref 5–15)
BUN: 19 mg/dL (ref 8–23)
CO2: 28 mmol/L (ref 22–32)
Calcium: 7.8 mg/dL — ABNORMAL LOW (ref 8.9–10.3)
Chloride: 102 mmol/L (ref 98–111)
Creatinine, Ser: 3 mg/dL — ABNORMAL HIGH (ref 0.61–1.24)
GFR, Estimated: 22 mL/min — ABNORMAL LOW (ref >60)
Glucose, Bld: 118 mg/dL — ABNORMAL HIGH (ref 70–99)
Potassium: 3.6 mmol/L (ref 3.5–5.1)
Sodium: 139 mmol/L (ref 135–145)
Total Bilirubin: 0.2 mg/dL — ABNORMAL LOW (ref 0.3–1.2)
Total Protein: 5.2 g/dL — ABNORMAL LOW (ref 6.5–8.1)

## 2021-11-08 MED ORDER — OXYCODONE HCL 5 MG PO TABS: 5.0000 mg | ORAL_TABLET | Freq: Four times a day (QID) | ORAL | 0 refills | Status: DC | PRN

## 2021-11-08 MED ORDER — PROCHLORPERAZINE MALEATE 5 MG PO TABS: 5.0000 mg | ORAL_TABLET | Freq: Four times a day (QID) | ORAL | 2 refills | Status: DC | PRN

## 2021-11-08 MED ORDER — DEXAMETHASONE 4 MG PO TABS
20.0000 mg | ORAL_TABLET | Freq: Once | ORAL | Status: AC
  Administered 2021-11-08: 14:00:00 20 mg via ORAL
  Filled 2021-11-08: qty 5

## 2021-11-08 MED ORDER — DIPHENHYDRAMINE HCL 25 MG PO CAPS
50.0000 mg | ORAL_CAPSULE | Freq: Once | ORAL | Status: AC
  Administered 2021-11-08: 14:00:00 50 mg via ORAL
  Filled 2021-11-08: qty 2

## 2021-11-08 MED ORDER — ONDANSETRON HCL 4 MG PO TABS: 4.0000 mg | ORAL_TABLET | Freq: Four times a day (QID) | ORAL | 2 refills | Status: DC | PRN

## 2021-11-08 MED ORDER — ALPRAZOLAM 0.5 MG PO TABS: 0.5000 mg | ORAL_TABLET | Freq: Two times a day (BID) | ORAL | 0 refills | Status: DC | PRN

## 2021-11-08 MED ORDER — DARATUMUMAB-HYALURONIDASE-FIHJ 1800-30000 MG-UT/15ML ~~LOC~~ SOLN
1800.0000 mg | Freq: Once | SUBCUTANEOUS | Status: AC
  Administered 2021-11-08: 15:00:00 1800 mg via SUBCUTANEOUS
  Filled 2021-11-08: qty 15

## 2021-11-08 MED ORDER — BORTEZOMIB CHEMO SQ INJECTION 3.5 MG (2.5MG/ML)
1.3000 mg/m2 | Freq: Once | INTRAMUSCULAR | Status: AC
  Administered 2021-11-08: 15:00:00 2.5 mg via SUBCUTANEOUS
  Filled 2021-11-08: qty 1

## 2021-11-08 MED ORDER — ACETAMINOPHEN 325 MG PO TABS
650.0000 mg | ORAL_TABLET | Freq: Once | ORAL | Status: AC
  Administered 2021-11-08: 14:00:00 650 mg via ORAL
  Filled 2021-11-08: qty 2

## 2021-11-08 NOTE — Patient Instructions (Signed)
[facility] ONCOLOGY  Discharge Instructions: Thank you for choosing [facility] to provide your oncology and hematology care.   If you have a lab appointment with the [facility], please go directly to the [facility] and check in at the registration area.   Wear comfortable clothing and clothing appropriate for easy access to any Portacath or PICC line.   We strive to give you quality time with your provider. You may need to reschedule your appointment if you arrive late (15 or more minutes).  Arriving late affects you and other patients whose appointments are after yours.  Also, if you miss three or more appointments without notifying the office, you may be dismissed from the clinic at the provider's discretion.      For prescription refill requests, have your pharmacy contact our office and allow 72 hours for refills to be completed.    Today you received the following chemotherapy and/or immunotherapy agents darzalex, bortezomib      To help prevent nausea and vomiting after your treatment, we encourage you to take your nausea medication as directed.  BELOW ARE SYMPTOMS THAT SHOULD BE REPORTED IMMEDIATELY: *FEVER GREATER THAN 100.4 F (38 C) OR HIGHER *CHILLS OR SWEATING *NAUSEA AND VOMITING THAT IS NOT CONTROLLED WITH YOUR NAUSEA MEDICATION *UNUSUAL SHORTNESS OF BREATH *UNUSUAL BRUISING OR BLEEDING *URINARY PROBLEMS (pain or burning when urinating, or frequent urination) *BOWEL PROBLEMS (unusual diarrhea, constipation, pain near the anus) TENDERNESS IN MOUTH AND THROAT WITH OR WITHOUT PRESENCE OF ULCERS (sore throat, sores in mouth, or a toothache) UNUSUAL RASH, SWELLING OR PAIN  UNUSUAL VAGINAL DISCHARGE OR ITCHING   Items with * indicate a potential emergency and should be followed up as soon as possible or go to the Emergency Department if any problems should occur.  Please show the CHEMOTHERAPY ALERT CARD or IMMUNOTHERAPY ALERT CARD at  check-in to the Emergency Department and triage nurse.  Should you have questions after your visit or need to cancel or reschedule your appointment, please contact [facility]  Dept: [phone]  and follow the prompts.  Office hours are 8:00 a.m. to 4:30 p.m. Monday - Friday. Please note that voicemails left after 4:00 p.m. may not be returned until the following business day.  We are closed weekends and major holidays. You have access to a nurse at all times for urgent questions. Please call the main number to the clinic Dept: [phone] and follow the prompts.   For any non-urgent questions, you may also contact your provider using MyChart. We now offer e-Visits for anyone 7 and older to request care online for non-urgent symptoms. For details visit [email or web address].   Also download the MyChart app! Go to the app store, search "MyChart", open the app, select [facility], and log in with your MyChart username and password.  Due to Covid, a mask is required upon entering the hospital/clinic. If you do not have a mask, one will be given to you upon arrival. For doctor visits, patients may have 1 support person aged 50 or older with them. For treatment visits, patients cannot have anyone with them due to current Covid guidelines and our immunocompromised population.

## 2021-11-08 NOTE — Progress Notes (Signed)
[facility]   Telephone:(336) [phone] Fax:(336) [phone]   Clinic Follow up Note   Patient Care Team: Pcp, No as PCP - General Buntin, Lavonda Jumbo, RN as Registered Nurse  Date of Service:  11/08/2021  CHIEF COMPLAINT: f/u of f/u of AL amyloidosis with multiple organs involvement   CURRENT THERAPY:  Weekly Dexa, Velcade and Cytoxan, started 09/10/21 -daratumumab injection added 09/27/21  ASSESSMENT & PLAN:  Caleb Simmons is a 70 y.o. male with   1. AL amyloidosis with renal, cardiac and neuro involvement  -initially presented with weakness and SOB; lab work up showed BUN of 44, Cr 6.5, significant proteinuria/RBC on UA. Right renal biopsy on 08/28/21 revealing AL amyloidosis, lambda light chain composition. 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 Both serum and urine lamda Free light chains significantly elevated. -He started hemodialysis in the hospital, and currently on Monday Wednesday Friday schedule. -Due to the multiorgan involvement, with significant CHF (EF 45-50%) and renal failure, his prognosis is very poor. -He began weekly oral Cytoxan, dexa and Velcade injection while inpatient on 09/10/21.  He is tolerating well so far  -He began daratumumab injection 09/27/21, will continue every week for 8 weeks, then changed to every other week. -his light chain levels showed improvement on repeat labs on 10/04/21. -he is tolerating chemo well, will decrease dexa from 16m to 224mweekly due to insomnia  -not a candidate for biphosphonate due to renal failure   2. Symptom management: Nausea, Insomnia, Low appetite, constipation, anxiety -zofran helps his nausea -[facility]. She prescribed remeron and ambien for him at that time. They do not feel the remeron is helping. He endorses supplementing with protein drinks. -he reports continued issues sleeping, and I recommended increasing the ambien. He also  reports worsening anxiety. He notes he took Xanax in the hospital which helped. I advised him to also try this for sleep, but I advised him not to take them at the same time.   3. Orthostatic Hypotension and CHF  -Likely secondary to amyloidosis with neuro involvement  -He had a significant symptomatic orthostasis, SBP drops to 70's when he stands up -he met with Dr. McAundra Dubinn 10/23/21 and was started on midodrine.   4. 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 ESRD on HD, secondary to #1 -he is on dialysis  Mon, Wed, Fri. -I spoke with his nephrologist to see if he can do less dialysis since his amyloidosis has improved. At this time, it's not recommended, but maybe at a later time.     PLAN: -Proceed with weekly CyBorD and Dara injection -I refilled zofran, compazine, and oxycodone; I prescribed Xanax -lab and CyBorD and Dara injection in 2 weeks (off next week dye to [place]) -f/u with me in 2 weeks and with palliative care same day   No problem-specific Assessment & Plan notes found for this encounter.   INTERVAL HISTORY:  Caleb Simmons here for a follow up of AL amyloidosis . He was last seen by me on 10/18/21. He presents to the clinic accompanied by his wife. He reports he has not felt well since he received his flu shot.  He also reports he continues to not sleep  well.   All other systems were reviewed with the patient and are negative.  MEDICAL HISTORY:  No past medical history on file.  SURGICAL HISTORY: Past Surgical History:  Procedure Laterality Date   APPENDECTOMY     IR EMBO ART  VEN HEMORR LYMPH EXTRAV  INC GUIDE ROADMAPPING  08/30/2021   IR FLUORO GUIDE CV LINE RIGHT  08/30/2021   IR FLUORO GUIDE CV LINE RIGHT  09/18/2021   IR RENAL SELECTIVE  UNI  INC S&I MOD SED  08/31/2021   IR US GUIDE VASC ACCESS RIGHT  08/30/2021   IR US GUIDE VASC ACCESS RIGHT  08/30/2021   IR US GUIDE VASC ACCESS RIGHT  09/18/2021    I have reviewed the social history and family history with the patient and they are unchanged from previous note.  ALLERGIES:  has No Known Allergies.  MEDICATIONS:  Current Outpatient Medications  Medication Sig Dispense Refill   ALPRAZolam (XANAX) 0.5 MG tablet Take 1 tablet (0.5 mg total) by mouth 2 (two) times daily as needed for sleep or anxiety. 60 tablet 0   acetaminophen (TYLENOL) 325 MG tablet Take 2 tablets (650 mg total) by mouth every 6 (six) hours as needed for moderate pain (headache).     cyclophosphamide (CYTOXAN) 50 MG capsule Take 9 capsules (450 mg total) by mouth once a week. Take with food to minimize GI upset. Take early in the day and maintain hydration. 36 capsule 1   folic acid (FOLVITE) 1 MG tablet Take 2 tablets (2 mg total) by mouth daily. 60 tablet 0   midodrine (PROAMATINE) 5 MG tablet Take 1 tablet (5 mg total) by mouth 3 (three) times daily with meals. 270 tablet 3   mirtazapine (REMERON) 15 MG tablet Take 1 tablet (15 mg total) by mouth at bedtime.     ondansetron (ZOFRAN) 4 MG tablet Take 1 tablet (4 mg total) by mouth every 6 (six) hours as needed for nausea or vomiting. 30 tablet 2   oxyCODONE (OXY IR/ROXICODONE) 5 MG immediate release tablet Take 1 tablet (5 mg total) by mouth every 6 (six) hours as needed for severe pain. 30 tablet 0   pantoprazole (PROTONIX) 40 MG tablet Take 1 tablet (40 mg total) by mouth 2 (two) times daily before a meal. 60 tablet 0   prochlorperazine (COMPAZINE) 5 MG tablet Take 1 tablet (5 mg total) by mouth every 6 (six) hours as needed for nausea or vomiting. 30 tablet 2   tamsulosin (FLOMAX) 0.4 MG CAPS capsule Take 1 capsule (0.4 mg total) by mouth daily. 30 capsule 0   zolpidem (AMBIEN) 5 MG tablet Take 1 tablet (5 mg total) by mouth at bedtime as needed for sleep. 30  tablet 0   No current facility-administered medications for this visit.   Facility-Administered Medications Ordered in Other Visits  Medication Dose Route Frequency Provider Last Rate Last Admin   bortezomib SQ (VELCADE) chemo injection (2.56m/mL concentration) 2.5 mg  1.3 mg/m2 (Treatment Plan Recorded) Subcutaneous Once FTruitt Merle MD       daratumumab-hyaluronidase-fihj ([facility]) 1800-30000 MG-UT/15ML chemo SQ injection 1,800 mg  1,800 mg Subcutaneous Once FTruitt Merle MD        PHYSICAL EXAMINATION: ECOG PERFORMANCE STATUS: 3 - Symptomatic, >50% confined to bed  Vitals:   11/08/21 1239  BP: 102/63  Pulse: 76  Resp: 17  SpO2: 99%   Wt Readings from Last 3 Encounters:  11/08/21 163 lb 3 oz (74 kg)  11/01/21 161 lb 12  oz (73.4 kg)  10/25/21 159 lb 8 oz (72.3 kg)     GENERAL:alert, no distress and comfortable SKIN: skin color normal, no rashes or significant lesions EYES: normal, Conjunctiva are pink and non-injected, sclera clear  NEURO: alert & oriented x 3 with fluent speech  LABORATORY DATA:  I have reviewed the data as listed CBC Latest Ref Rng & Units 11/08/2021 11/01/2021 10/25/2021  WBC 4.0 - 10.5 K/uL 7.2 5.7 5.6  Hemoglobin 13.0 - 17.0 g/dL 10.3(L) 9.7(L) 9.4(L)  Hematocrit 39.0 - 52.0 % 32.7(L) 30.8(L) 29.2(L)  Platelets 150 - 400 K/uL 155 167 173     CMP Latest Ref Rng & Units 11/08/2021 11/01/2021 10/25/2021  Glucose 70 - 99 mg/dL 118(H) 93 101(H)  BUN 8 - 23 mg/dL 19 19 15   Creatinine 0.61 - 1.24 mg/dL 3.00(H) 3.32(HH) 3.42(HH)  Sodium 135 - 145 mmol/L 139 138 137  Potassium 3.5 - 5.1 mmol/L 3.6 3.8 4.1  Chloride 98 - 111 mmol/L 102 102 102  CO2 22 - 32 mmol/L 28 25 25   Calcium 8.9 - 10.3 mg/dL 7.8(L) 7.6(L) 8.1(L)  Total Protein 6.5 - 8.1 g/dL 5.2(L) 5.2(L) 5.3(L)  Total Bilirubin 0.3 - 1.2 mg/dL 0.2(L) 0.3 0.4  Alkaline Phos 38 - 126 U/L 92 90 95  AST 15 - 41 U/L 10(L) 9(L) 10(L)  ALT 0 - 44 U/L 6 5 6       RADIOGRAPHIC STUDIES: I have  personally reviewed the radiological images as listed and agreed with the findings in the report. No results found.    No orders of the defined types were placed in this encounter.  All questions were answered. The patient knows to call the clinic with any problems, questions or concerns. No barriers to learning was detected. The total time spent in the appointment was 30 minutes.     Truitt Merle, MD 11/08/2021   I, Wilburn Mylar, am acting as scribe for Truitt Merle, MD.   I have reviewed the above documentation for accuracy and completeness, and I agree with the above.

## 2021-11-08 NOTE — Progress Notes (Signed)
Caleb Simmons  Telephone:(336) [phone] Fax:(336) [phone]   Name: Caleb Simmons Date: 11/08/2021 MRN: [id number]  DOB: [birth year]  Patient Care Team: Pcp, No as PCP - General Caleb Simmons, Caleb Jumbo, RN as Registered Nurse    Caleb Simmons is a 70 y.o. 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  Palliative visit post-hospital follow-up for ongoing goals of care discussions and symptom management.   SOCIAL HISTORY:     reports that he has been smoking cigarettes. He has a 6.25 pack-year smoking history. He has never used smokeless tobacco.  ADVANCE DIRECTIVES:  Patient reports completed document.  [name]. is his healthcare power of attorney.  CODE STATUS: Full code  PAST MEDICAL HISTORY:No past medical history on file.  PAST SURGICAL HISTORY:  Past Surgical History:  Procedure Laterality Date   APPENDECTOMY     IR EMBO ART  VEN HEMORR LYMPH EXTRAV  INC GUIDE ROADMAPPING  08/30/2021   IR FLUORO GUIDE CV LINE RIGHT  08/30/2021   IR FLUORO GUIDE CV LINE RIGHT  09/18/2021   IR RENAL SELECTIVE  UNI INC S&I MOD SED  08/31/2021   IR US GUIDE VASC ACCESS RIGHT  08/30/2021   IR US GUIDE VASC ACCESS RIGHT  08/30/2021   IR US GUIDE VASC ACCESS RIGHT  09/18/2021    HEMATOLOGY/ONCOLOGY HISTORY:  Oncology History   No history exists.    ALLERGIES:  has No Known Allergies.  MEDICATIONS:  Current Outpatient Medications  Medication Sig Dispense Refill   acetaminophen (TYLENOL) 325 MG tablet Take 2 tablets (650 mg total) by mouth every 6 (six) hours as needed for moderate pain (headache).     ALPRAZolam (XANAX) 0.5 MG tablet Take 1 tablet (0.5 mg total) by mouth 2 (two) times daily as needed for sleep or anxiety. 60 tablet 0   cyclophosphamide (CYTOXAN) 50 MG capsule Take 9 capsules (450 mg total) by mouth once a week. Take with  food to minimize GI upset. Take early in the day and maintain hydration. 36 capsule 1   folic acid (FOLVITE) 1 MG tablet Take 2 tablets (2 mg total) by mouth daily. 60 tablet 0   midodrine (PROAMATINE) 5 MG tablet Take 1 tablet (5 mg total) by mouth 3 (three) times daily with meals. 270 tablet 3   mirtazapine (REMERON) 15 MG tablet Take 1 tablet (15 mg total) by mouth at bedtime.     ondansetron (ZOFRAN) 4 MG tablet Take 1 tablet (4 mg total) by mouth every 6 (six) hours as needed for nausea or vomiting. 30 tablet 2   oxyCODONE (OXY IR/ROXICODONE) 5 MG immediate release tablet Take 1 tablet (5 mg total) by mouth every 6 (six) hours as needed for severe pain. 30 tablet 0   pantoprazole (PROTONIX) 40 MG tablet Take 1 tablet (40 mg total) by mouth 2 (two) times daily before a meal. 60 tablet 0   prochlorperazine (COMPAZINE) 5 MG tablet Take 1 tablet (5 mg total) by mouth every 6 (six) hours as needed for nausea or vomiting. 30 tablet 2   tamsulosin (FLOMAX) 0.4 MG CAPS capsule Take 1 capsule (0.4 mg total) by mouth daily. 30 capsule 0   zolpidem (AMBIEN) 5 MG tablet Take 1 tablet (5 mg total) by mouth at bedtime as needed for sleep. 30 tablet 0   No current facility-administered medications for this visit.  VITAL SIGNS: There were no vitals taken for this visit. There were no vitals filed for this visit.  Estimated body mass index is 23.41 kg/m as calculated from the following:   Height as of 11/01/21: 5\' 10"  (1.778 m).   Weight as of an earlier encounter on 11/08/21: 163 lb 3 oz (74 kg).  LABS: CBC:    Component Value Date/Time   WBC 7.2 11/08/2021 1209   HGB 10.3 (L) 11/08/2021 1209   HGB 9.1 (L) 09/27/2021 0733   HCT 32.7 (L) 11/08/2021 1209   PLT 155 11/08/2021 1209   PLT 223 09/27/2021 0733   MCV 91.1 11/08/2021 1209   NEUTROABS 5.6 11/08/2021 1209   LYMPHSABS 1.0 11/08/2021 1209   MONOABS 0.5 11/08/2021 1209   EOSABS 0.1 11/08/2021 1209   BASOSABS 0.1 11/08/2021 1209    Comprehensive Metabolic Panel:    Component Value Date/Time   NA 139 11/08/2021 1209   K 3.6 11/08/2021 1209   CL 102 11/08/2021 1209   CO2 28 11/08/2021 1209   BUN 19 11/08/2021 1209   CREATININE 3.00 (H) 11/08/2021 1209   CREATININE 3.38 (HH) 10/04/2021 1201   GLUCOSE 118 (H) 11/08/2021 1209   CALCIUM 7.8 (L) 11/08/2021 1209   AST 10 (L) 11/08/2021 1209   AST 10 (L) 10/04/2021 1201   ALT 6 11/08/2021 1209   ALT 8 10/04/2021 1201   ALKPHOS 92 11/08/2021 1209   BILITOT 0.2 (L) 11/08/2021 1209   BILITOT 0.4 10/04/2021 1201   PROT 5.2 (L) 11/08/2021 1209   ALBUMIN 2.4 (L) 11/08/2021 1209    RADIOGRAPHIC STUDIES: No results found.  PERFORMANCE STATUS (ECOG) : 3 - Symptomatic, >50% confined to bed   Physical Exam General: NAD Cardiovascular: regular rate and rhythm Pulmonary: normal breathing pattern  Abdomen: soft, nontender, + bowel sounds Neurological: AAOx3, mood appropriate  IMPRESSION:  I saw Caleb Simmons in infusion today. No acute distress noted. He denies pain. He also was seen by Dr. Burr Simmons today. He is tolerating dialysis but speaks to discomfort after several hours due to positioning to support his hypotension. He is taking midodrine.   We discussed his insomnia, constipation, anxiety, and nutrition. He has been taking the Remeron. He is feels his appetite is somewhat improved. Shares he ate [employer] on yesterday which he enjoyed. He does feel his appetite has improved at least 25% over the past 2-3 weeks. We discussed continuing vs. Discontinuing the Remeron. He would like to continue with current dose as he does notice some change and if it is providing this benefit does not want to interrupt this. He is looking forward to Thanksgiving and appreciative that he has an urge for an appetite as this was not the case in the past.   He is not having constipation. Endorses taking Miralax twice daily and feels this is effective.   He continues to have some insomnia. He  is able to sleep for a few more hours but is still finding himself waking up and unable to go back to sleep. He becomes emotional sharing that his anxiety is increasing. He states he has nightmares and his thoughts during idol time at night sometimes increases his anxiety. Emotional support provided. Dr. Burr Simmons has re-started his Xanax which he is appreciative of.   All questions answered and support provided.   PLAN: Continue Miralax twice daily Continue Remeron for appetite. If no improvement may increase dose or discontinue. Could consider other options. He would like to stay the course at  this time as he feels his appetite is somewhat improved and endorses appreciation for having an appetite and wanting food as this has not been the case previously.  Xanax as prescribed by Dr. Burr Simmons for anxieyt Continue Ambien for insomnia. Education provided not to take Xanax and Ambien simultaneously.  Continue Oxy IR as needed for pain  I will plan to see patient back in clinic in 2-3 weeks or sooner if needed.    Patient expressed understanding and was in agreement with this plan. He also understands that He can call the clinic at any time with any questions, concerns, or complaints.     Time Total: 40 min.   Visit consisted of counseling and education dealing with the complex and emotionally intense issues of symptom management and palliative care in the setting of serious and potentially life-threatening illness.Greater than 50%  of this time was spent counseling and coordinating care related to the above assessment and plan.  Signed by: Alda Lea, AGPCNP-BC Palliative Medicine Team

## 2021-11-09 ENCOUNTER — Encounter: Payer: Self-pay | Admitting: Hematology

## 2021-11-13 ENCOUNTER — Other Ambulatory Visit: Payer: Self-pay

## 2021-11-13 ENCOUNTER — Other Ambulatory Visit (HOSPITAL_COMMUNITY): Payer: Self-pay

## 2021-11-13 MED ORDER — FOLIC ACID 1 MG PO TABS: 2.0000 mg | ORAL_TABLET | Freq: Every day | ORAL | 0 refills | Status: DC

## 2021-11-13 MED ORDER — MIRTAZAPINE 15 MG PO TABS: 15.0000 mg | ORAL_TABLET | Freq: Every day | ORAL | 0 refills | Status: DC

## 2021-11-13 MED ORDER — PANTOPRAZOLE SODIUM 40 MG PO TBEC
40.0000 mg | DELAYED_RELEASE_TABLET | Freq: Two times a day (BID) | ORAL | 0 refills | Status: DC
Start: 2021-11-13 — End: 2021-11-29

## 2021-11-13 MED ORDER — ZOLPIDEM TARTRATE 5 MG PO TABS: 5.0000 mg | ORAL_TABLET | Freq: Every evening | ORAL | 0 refills | Status: DC | PRN

## 2021-11-14 ENCOUNTER — Ambulatory Visit: Payer: Commercial Managed Care - PPO

## 2021-11-14 ENCOUNTER — Other Ambulatory Visit: Payer: Commercial Managed Care - PPO

## 2021-11-15 ENCOUNTER — Other Ambulatory Visit: Payer: Self-pay | Admitting: Hematology

## 2021-11-16 ENCOUNTER — Other Ambulatory Visit: Payer: Self-pay | Admitting: Nurse Practitioner

## 2021-11-16 ENCOUNTER — Other Ambulatory Visit (HOSPITAL_COMMUNITY): Payer: Self-pay

## 2021-11-16 NOTE — Telephone Encounter (Signed)
Refilled on 11/22 by Dr. Burr Medico. Gardiner Rhyme, RN

## 2021-11-19 ENCOUNTER — Other Ambulatory Visit: Payer: Self-pay | Admitting: Hematology

## 2021-11-22 ENCOUNTER — Inpatient Hospital Stay (HOSPITAL_BASED_OUTPATIENT_CLINIC_OR_DEPARTMENT_OTHER): Payer: Commercial Managed Care - PPO | Admitting: Hematology

## 2021-11-22 ENCOUNTER — Inpatient Hospital Stay: Payer: Commercial Managed Care - PPO | Attending: Hematology

## 2021-11-22 ENCOUNTER — Encounter: Payer: Self-pay | Admitting: Hematology

## 2021-11-22 ENCOUNTER — Other Ambulatory Visit: Payer: Self-pay

## 2021-11-22 ENCOUNTER — Inpatient Hospital Stay (HOSPITAL_BASED_OUTPATIENT_CLINIC_OR_DEPARTMENT_OTHER): Payer: Commercial Managed Care - PPO | Admitting: Nurse Practitioner

## 2021-11-22 ENCOUNTER — Inpatient Hospital Stay: Payer: Commercial Managed Care - PPO

## 2021-11-22 ENCOUNTER — Telehealth: Payer: Self-pay

## 2021-11-22 VITALS — BP 95/69 | HR 100 | Temp 98.5°F | Resp 17 | Wt 163.1 lb

## 2021-11-22 DIAGNOSIS — K5900 Constipation, unspecified: Secondary | ICD-10-CM

## 2021-11-22 DIAGNOSIS — E8581 Light chain (AL) amyloidosis: Secondary | ICD-10-CM

## 2021-11-22 DIAGNOSIS — G893 Neoplasm related pain (acute) (chronic): Secondary | ICD-10-CM

## 2021-11-22 DIAGNOSIS — N186 End stage renal disease: Secondary | ICD-10-CM | POA: Insufficient documentation

## 2021-11-22 DIAGNOSIS — Z5112 Encounter for antineoplastic immunotherapy: Secondary | ICD-10-CM | POA: Diagnosis not present

## 2021-11-22 DIAGNOSIS — I509 Heart failure, unspecified: Secondary | ICD-10-CM | POA: Insufficient documentation

## 2021-11-22 DIAGNOSIS — R531 Weakness: Secondary | ICD-10-CM

## 2021-11-22 DIAGNOSIS — N08 Glomerular disorders in diseases classified elsewhere: Secondary | ICD-10-CM | POA: Diagnosis not present

## 2021-11-22 DIAGNOSIS — Z992 Dependence on renal dialysis: Secondary | ICD-10-CM | POA: Insufficient documentation

## 2021-11-22 DIAGNOSIS — E854 Organ-limited amyloidosis: Secondary | ICD-10-CM

## 2021-11-22 DIAGNOSIS — I951 Orthostatic hypotension: Secondary | ICD-10-CM | POA: Diagnosis not present

## 2021-11-22 DIAGNOSIS — I43 Cardiomyopathy in diseases classified elsewhere: Secondary | ICD-10-CM

## 2021-11-22 DIAGNOSIS — C9000 Multiple myeloma not having achieved remission: Secondary | ICD-10-CM | POA: Diagnosis present

## 2021-11-22 DIAGNOSIS — D649 Anemia, unspecified: Secondary | ICD-10-CM | POA: Diagnosis not present

## 2021-11-22 DIAGNOSIS — Z515 Encounter for palliative care: Secondary | ICD-10-CM

## 2021-11-22 LAB — CBC WITH DIFFERENTIAL/PLATELET
Abs Immature Granulocytes: 0.01 10*3/uL (ref 0.00–0.07)
Basophils Absolute: 0.1 10*3/uL (ref 0.0–0.1)
Basophils Relative: 1 %
Eosinophils Absolute: 0.2 10*3/uL (ref 0.0–0.5)
Eosinophils Relative: 3 %
HCT: 33.7 % — ABNORMAL LOW (ref 39.0–52.0)
Hemoglobin: 11 g/dL — ABNORMAL LOW (ref 13.0–17.0)
Immature Granulocytes: 0 %
Lymphocytes Relative: 22 %
Lymphs Abs: 1.8 10*3/uL (ref 0.7–4.0)
MCH: 29.2 pg (ref 26.0–34.0)
MCHC: 32.6 g/dL (ref 30.0–36.0)
MCV: 89.4 fL (ref 80.0–100.0)
Monocytes Absolute: 0.6 10*3/uL (ref 0.1–1.0)
Monocytes Relative: 7 %
Neutro Abs: 5.5 10*3/uL (ref 1.7–7.7)
Neutrophils Relative %: 67 %
Platelets: 200 10*3/uL (ref 150–400)
RBC: 3.77 MIL/uL — ABNORMAL LOW (ref 4.22–5.81)
RDW: 18.1 % — ABNORMAL HIGH (ref 11.5–15.5)
WBC: 8.2 10*3/uL (ref 4.0–10.5)
nRBC: 0 % (ref 0.0–0.2)

## 2021-11-22 LAB — COMPREHENSIVE METABOLIC PANEL
ALT: 7 U/L (ref 0–44)
AST: 10 U/L — ABNORMAL LOW (ref 15–41)
Albumin: 2.6 g/dL — ABNORMAL LOW (ref 3.5–5.0)
Alkaline Phosphatase: 101 U/L (ref 38–126)
Anion gap: 13 (ref 5–15)
BUN: 21 mg/dL (ref 8–23)
CO2: 23 mmol/L (ref 22–32)
Calcium: 8.3 mg/dL — ABNORMAL LOW (ref 8.9–10.3)
Chloride: 102 mmol/L (ref 98–111)
Creatinine, Ser: 3.45 mg/dL (ref 0.61–1.24)
GFR, Estimated: 18 mL/min — ABNORMAL LOW (ref >60)
Glucose, Bld: 139 mg/dL — ABNORMAL HIGH (ref 70–99)
Potassium: 3.7 mmol/L (ref 3.5–5.1)
Sodium: 138 mmol/L (ref 135–145)
Total Bilirubin: <0.2 mg/dL — ABNORMAL LOW (ref 0.3–1.2)
Total Protein: 6.1 g/dL — ABNORMAL LOW (ref 6.5–8.1)

## 2021-11-22 MED ORDER — ZOLPIDEM TARTRATE 5 MG PO TABS: 5.0000 mg | ORAL_TABLET | Freq: Every evening | ORAL | 0 refills | Status: DC | PRN

## 2021-11-22 MED ORDER — ZOLPIDEM TARTRATE 5 MG PO TABS
5.0000 mg | ORAL_TABLET | Freq: Every evening | ORAL | 0 refills | Status: DC | PRN
Start: 2021-11-22 — End: 2021-12-13

## 2021-11-22 MED ORDER — DARATUMUMAB-HYALURONIDASE-FIHJ 1800-30000 MG-UT/15ML ~~LOC~~ SOLN
1800.0000 mg | Freq: Once | SUBCUTANEOUS | Status: AC
  Administered 2021-11-22: 1800 mg via SUBCUTANEOUS
  Filled 2021-11-22: qty 15

## 2021-11-22 MED ORDER — ACETAMINOPHEN 325 MG PO TABS
650.0000 mg | ORAL_TABLET | Freq: Once | ORAL | Status: AC
  Administered 2021-11-22: 650 mg via ORAL
  Filled 2021-11-22: qty 2

## 2021-11-22 MED ORDER — DIPHENHYDRAMINE HCL 25 MG PO CAPS
50.0000 mg | ORAL_CAPSULE | Freq: Once | ORAL | Status: AC
  Administered 2021-11-22: 50 mg via ORAL
  Filled 2021-11-22: qty 2

## 2021-11-22 MED ORDER — OXYCODONE HCL 5 MG PO TABS: 5.0000 mg | ORAL_TABLET | Freq: Four times a day (QID) | ORAL | 0 refills | Status: DC | PRN

## 2021-11-22 MED ORDER — DEXAMETHASONE 4 MG PO TABS
20.0000 mg | ORAL_TABLET | Freq: Once | ORAL | Status: AC
  Administered 2021-11-22: 20 mg via ORAL
  Filled 2021-11-22: qty 5

## 2021-11-22 MED ORDER — CYCLOPHOSPHAMIDE 50 MG PO CAPS
225.0000 mg/m2 | ORAL_CAPSULE | ORAL | 1 refills | Status: DC
Start: 2021-11-22 — End: 2022-02-21
  Filled 2021-11-22 – 2021-12-04 (×2): qty 36, 28d supply, fill #0
  Filled 2022-02-21: qty 36, 28d supply, fill #1

## 2021-11-22 MED ORDER — BORTEZOMIB CHEMO SQ INJECTION 3.5 MG (2.5MG/ML)
1.3000 mg/m2 | Freq: Once | INTRAMUSCULAR | Status: AC
  Administered 2021-11-22: 2.5 mg via SUBCUTANEOUS
  Filled 2021-11-22: qty 1

## 2021-11-22 NOTE — Progress Notes (Signed)
Creatinine 3.45.  Per Dr. Burr Medico ok to proceed with chemotherapy.  Pt took home medication Cytoxan 450 mg (9 capsules).

## 2021-11-22 NOTE — Progress Notes (Signed)
[facility]   Telephone:(336) [phone] Fax:(336) [phone]   Clinic Follow up Note   Patient Care Team: Pcp, No as PCP - General Buntin, Lavonda Jumbo, RN as Registered Nurse Pickenpack-Cousar, Carlena Sax, NP as Nurse Practitioner (Nurse Practitioner)  Date of Service:  11/22/2021  CHIEF COMPLAINT: f/u of AL amyloidosis with multiple organs involvement   CURRENT THERAPY:  Weekly Dexa, Velcade and Cytoxan, started 09/10/21 -daratumumab injection added 09/27/21  ASSESSMENT & PLAN:  Caleb Simmons is a 70 y.o. male with   1. AL amyloidosis with renal, cardiac and neuro involvement  -initially presented with weakness and SOB; lab work up showed BUN of 44, Cr 6.5, significant proteinuria/RBC on UA. Right renal biopsy on 08/28/21 revealing AL amyloidosis, lambda light chain composition. 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 Both serum and urine lamda Free light chains significantly elevated. -He started hemodialysis in the hospital, and currently on Monday Wednesday Friday schedule. -Due to the multiorgan involvement, with significant CHF (EF 45-50%) and renal failure, his prognosis is very poor. -He began weekly oral Cytoxan, dexa and Velcade injection while inpatient on 09/10/21.  He is tolerating well so far  -He began daratumumab injection 09/27/21, will continue every week with breaks for holidays  -his light chain levels showed improvement on repeat labs on 10/04/21. -he is tolerating chemo well, dexa decreased from 47m to 286mweekly due to insomnia  -not a candidate for biphosphonate due to renal failure   2. Symptom management: Nausea, Insomnia, Low appetite, constipation, anxiety -zofran helps his nausea -he was prescribed remeron and ambien on 10/18/21 by NP NiLexine BatonThey do not feel the remeron is helping. I advised he can increase the dose; the highest dose is 30 mg -he reports continued issues sleeping, and I recommended increasing the ambien.  -I  prescribed Xanax on 11/08/21 to help his anxiety and sleep.   3. Orthostatic Hypotension and CHF  -Likely secondary to amyloidosis with neuro involvement  -He had a significant symptomatic orthostasis, SBP drops to 70's when he stands up -he met with Dr. McAundra Dubinn 10/23/21 and was started on midodrine.   4. 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 ESRD on HD, secondary to #1 -he is on dialysis  Mon, Wed, Fri. -I spoke with his nephrologist to see if he can do less dialysis since his amyloidosis has improved. At this time, it's not recommended, but maybe at a later time.     PLAN: -Proceed with weekly CyBorD and Dara injection -I refilled oxycodone; we will look into the ambien prescription -lab, f/u, and CyBorD and Dara injection on 12/27/21 -continue Dara and Velcade weekly after   No problem-specific Assessment & Plan notes found for this encounter.   INTERVAL HISTORY:  AlCLOYD RAGASs here for a follow up of AL amyloidosis. He was last seen by me on 11/08/21. He presents to the clinic accompanied by his wife. He reports he continues to not sleep well. His wife notes they were unable to obtain his amLorrin Simmons to a prescription issue. She adds that their daughter-in-law has been calling [facility] for a refill. We have no record of any calls. He reports his pain is controlled with oxycodone. She reports he is also not eating well  and feels the mirtazapine isn't helping him. She notes, however, that he says food doesn't taste good.   All other systems were reviewed with the patient and are negative.  MEDICAL HISTORY:  History reviewed. No pertinent past medical history.  SURGICAL HISTORY: Past Surgical History:  Procedure Laterality Date   APPENDECTOMY     IR EMBO ART   VEN HEMORR LYMPH EXTRAV  INC GUIDE ROADMAPPING  08/30/2021   IR FLUORO GUIDE CV LINE RIGHT  08/30/2021   IR FLUORO GUIDE CV LINE RIGHT  09/18/2021   IR RENAL SELECTIVE  UNI INC S&I MOD SED  08/31/2021   IR US GUIDE VASC ACCESS RIGHT  08/30/2021   IR US GUIDE VASC ACCESS RIGHT  08/30/2021   IR US GUIDE VASC ACCESS RIGHT  09/18/2021    I have reviewed the social history and family history with the patient and they are unchanged from previous note.  ALLERGIES:  has No Known Allergies.  MEDICATIONS:  Current Outpatient Medications  Medication Sig Dispense Refill   acetaminophen (TYLENOL) 325 MG tablet Take 2 tablets (650 mg total) by mouth every 6 (six) hours as needed for moderate pain (headache).     ALPRAZolam (XANAX) 0.5 MG tablet Take 1 tablet (0.5 mg total) by mouth 2 (two) times daily as needed for sleep or anxiety. 60 tablet 0   cyclophosphamide (CYTOXAN) 50 MG capsule Take 9 capsules (450 mg total) by mouth once a week. Take with food to minimize GI upset. Take early in the day and maintain hydration. 36 capsule 1   folic acid (FOLVITE) 1 MG tablet Take 2 tablets (2 mg total) by mouth daily. 60 tablet 0   midodrine (PROAMATINE) 5 MG tablet Take 1 tablet (5 mg total) by mouth 3 (three) times daily with meals. 270 tablet 3   mirtazapine (REMERON) 15 MG tablet TAKE 1 TABLET BY MOUTH AT BEDTIME 30 tablet 0   ondansetron (ZOFRAN) 4 MG tablet Take 1 tablet (4 mg total) by mouth every 6 (six) hours as needed for nausea or vomiting. 30 tablet 2   oxyCODONE (OXY IR/ROXICODONE) 5 MG immediate release tablet Take 1 tablet (5 mg total) by mouth every 6 (six) hours as needed for severe pain. 45 tablet 0   pantoprazole (PROTONIX) 40 MG tablet Take 1 tablet (40 mg total) by mouth 2 (two) times daily before a meal. 60 tablet 0   prochlorperazine (COMPAZINE) 5 MG tablet Take 1 tablet (5 mg total) by mouth every 6 (six) hours as needed for nausea or vomiting. 30 tablet 2   tamsulosin (FLOMAX) 0.4 MG CAPS capsule  TAKE 1 CAPSULE BY MOUTH EVERY DAY 30 capsule 0   zolpidem (AMBIEN) 5 MG tablet Take 1 tablet (5 mg total) by mouth at bedtime as needed for sleep. 30 tablet 0   No current facility-administered medications for this visit.    PHYSICAL EXAMINATION: ECOG PERFORMANCE STATUS: 3 - Symptomatic, >50% confined to bed  Vitals:   11/22/21 1429  BP: 95/69  Pulse: 100  Resp: 17  Temp: 98.5 F (36.9 C)  SpO2: 97%   Wt Readings from Last 3 Encounters:  11/22/21 163 lb 1 oz (74 kg)  11/08/21 163 lb 3 oz (74 kg)  11/01/21 161 lb 12 oz (73.4 kg)     GENERAL:alert, no distress and comfortable SKIN: skin color normal, no rashes or significant lesions EYES: normal, Conjunctiva are pink and non-injected, sclera clear  NEURO: alert & oriented x 3 with fluent speech  LABORATORY DATA:  I have reviewed the data as listed CBC Latest Ref Rng & Units 11/22/2021 11/08/2021 11/01/2021  WBC 4.0 - 10.5 K/uL 8.2 7.2 5.7  Hemoglobin 13.0 - 17.0 g/dL 11.0(L) 10.3(L) 9.7(L)  Hematocrit 39.0 - 52.0 % 33.7(L) 32.7(L) 30.8(L)  Platelets 150 - 400 K/uL 200 155 167     CMP Latest Ref Rng & Units 11/22/2021 11/08/2021 11/01/2021  Glucose 70 - 99 mg/dL 139(H) 118(H) 93  BUN 8 - 23 mg/dL 21 19 19   Creatinine 0.61 - 1.24 mg/dL 3.45(HH) 3.00(H) 3.32(HH)  Sodium 135 - 145 mmol/L 138 139 138  Potassium 3.5 - 5.1 mmol/L 3.7 3.6 3.8  Chloride 98 - 111 mmol/L 102 102 102  CO2 22 - 32 mmol/L 23 28 25   Calcium 8.9 - 10.3 mg/dL 8.3(L) 7.8(L) 7.6(L)  Total Protein 6.5 - 8.1 g/dL 6.1(L) 5.2(L) 5.2(L)  Total Bilirubin 0.3 - 1.2 mg/dL <0.2(L) 0.2(L) 0.3  Alkaline Phos 38 - 126 U/L 101 92 90  AST 15 - 41 U/L 10(L) 10(L) 9(L)  ALT 0 - 44 U/L 7 6 5       RADIOGRAPHIC STUDIES: I have personally reviewed the radiological images as listed and agreed with the findings in the report. No results found.    No orders of the defined types were placed in this encounter.  All questions were answered. The patient knows to call  the clinic with any problems, questions or concerns. No barriers to learning was detected. The total time spent in the appointment was 30 minutes.     Truitt Merle, MD 11/22/2021   I, Wilburn Mylar, am acting as scribe for Truitt Merle, MD.   I have reviewed the above documentation for accuracy and completeness, and I agree with the above.

## 2021-11-22 NOTE — Progress Notes (Signed)
[facility]  [phone] Fax:(336) [phone]   Name: TREVELL PARISEAU Date: 11/22/2021 MRN: [id number]  DOB: 08/08/1951  Patient Care Team: Pcp, No as PCP - General Buntin, Lavonda Jumbo, RN as Registered Nurse Pickenpack-Cousar, Carlena Sax, NP as Nurse Practitioner (Nurse Practitioner)    [name] is a 70 y.o. 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  Palliative visit post-hospital follow-up for ongoing goals of care discussions and symptom management.   SOCIAL HISTORY:     reports that he has been smoking cigarettes. He has a 6.25 pack-year smoking history. He has never used smokeless tobacco.  ADVANCE DIRECTIVES:  Patient reports completed document.  Son Alicia, Ackert. is his healthcare power of attorney.  CODE STATUS: Full code  PAST MEDICAL HISTORY:No past medical history on file.   HEMATOLOGY/ONCOLOGY HISTORY:  Oncology History   No history exists.    ALLERGIES:  has No Known Allergies.  MEDICATIONS:  Current Outpatient Medications  Medication Sig Dispense Refill   acetaminophen (TYLENOL) 325 MG tablet Take 2 tablets (650 mg total) by mouth every 6 (six) hours as needed for moderate pain (headache).     ALPRAZolam (XANAX) 0.5 MG tablet Take 1 tablet (0.5 mg total) by mouth 2 (two) times daily as needed for sleep or anxiety. 60 tablet 0   cyclophosphamide (CYTOXAN) 50 MG capsule Take 9 capsules (450 mg total) by mouth once a week. Take with food to minimize GI upset. Take early in the day and maintain hydration. 36 capsule 1   folic acid (FOLVITE) 1 MG tablet Take 2 tablets (2 mg total) by mouth daily. 60 tablet 0   midodrine (PROAMATINE) 5 MG tablet Take 1 tablet (5 mg total) by mouth 3 (three) times daily with meals. 270 tablet 3   mirtazapine (REMERON) 15 MG tablet TAKE 1 TABLET BY MOUTH AT  BEDTIME 30 tablet 0   ondansetron (ZOFRAN) 4 MG tablet Take 1 tablet (4 mg total) by mouth every 6 (six) hours as needed for nausea or vomiting. 30 tablet 2   oxyCODONE (OXY IR/ROXICODONE) 5 MG immediate release tablet Take 1 tablet (5 mg total) by mouth every 6 (six) hours as needed for severe pain. 45 tablet 0   pantoprazole (PROTONIX) 40 MG tablet Take 1 tablet (40 mg total) by mouth 2 (two) times daily before a meal. 60 tablet 0   prochlorperazine (COMPAZINE) 5 MG tablet Take 1 tablet (5 mg total) by mouth every 6 (six) hours as needed for nausea or vomiting. 30 tablet 2   tamsulosin (FLOMAX) 0.4 MG CAPS capsule TAKE 1 CAPSULE BY MOUTH EVERY DAY 30 capsule 0   zolpidem (AMBIEN) 5 MG tablet Take 1 tablet (5 mg total) by mouth at bedtime as needed for sleep. 30 tablet 0   No current facility-administered medications for this visit.    VITAL SIGNS: There were no vitals taken for this visit. There were no vitals filed for this visit.  Estimated body mass index is 23.4 kg/m as calculated from the following:   Height as of 11/01/21: 5\' 10"  (1.778 m).   Weight as of an earlier encounter on 11/22/21: 163 lb 1 oz (74 kg).  LABS: CBC:    Component Value Date/Time   WBC 8.2 11/22/2021 1318   HGB 11.0 (L) 11/22/2021 1318   HGB 9.1 (L) 09/27/2021 0733   HCT 33.7 (  L) 11/22/2021 1318   PLT 200 11/22/2021 1318   PLT 223 09/27/2021 0733   MCV 89.4 11/22/2021 1318   NEUTROABS 5.5 11/22/2021 1318   LYMPHSABS 1.8 11/22/2021 1318   MONOABS 0.6 11/22/2021 1318   EOSABS 0.2 11/22/2021 1318   BASOSABS 0.1 11/22/2021 1318   Comprehensive Metabolic Panel:    Component Value Date/Time   NA 138 11/22/2021 1318   K 3.7 11/22/2021 1318   CL 102 11/22/2021 1318   CO2 23 11/22/2021 1318   BUN 21 11/22/2021 1318   CREATININE 3.45 (HH) 11/22/2021 1318   CREATININE 3.38 (HH) 10/04/2021 1201   GLUCOSE 139 (H) 11/22/2021 1318   CALCIUM 8.3 (L) 11/22/2021 1318   AST 10 (L) 11/22/2021 1318   AST 10 (L)  10/04/2021 1201   ALT 7 11/22/2021 1318   ALT 8 10/04/2021 1201   ALKPHOS 101 11/22/2021 1318   BILITOT <0.2 (L) 11/22/2021 1318   BILITOT 0.4 10/04/2021 1201   PROT 6.1 (L) 11/22/2021 1318   ALBUMIN 2.6 (L) 11/22/2021 1318     PERFORMANCE STATUS (ECOG) : 2 - Symptomatic, <50% confined to bed   Physical Exam General: NAD Pulmonary: clear ant fields Extremities: no edema, no joint deformities Skin: no rashes Neurological: Weakness, AAO x3, mood appropriate   IMPRESSION: Mr. Schweiss is here with his friend, [name] on today. No acute distress noted.  He is in a wheelchair.  He shares that he was able to spend some time with his family over the Thanksgiving holidays and tolerate some foods although appetite continues to remain minimal. He is taking the Remeron however has only seen minimal improvement. Discussed discontinuing and evaluating other options however he would like to continue at this time. Provided education and instructions to increase dose to 30 mg (2 tablets) and we can evaluate at his follow-up visit. He is in agreement.   He states his anxiety is much better and he is tolerating the Xanax. He shares he has been unable to get the Ambien prescribed on last visit. Continues to tolerate dialysis. He will fall asleep but will only sleep 1-2 hours and finds himself up for the remainder of the night despite all efforts to fall back to sleep.   Continued discussions and education on nutrition and symptom management. Mr. Frieden shares he has "good days and not so good days" however is appreciative of the ongoing support.   I discussed the importance of continued conversation with family and their medical providers regarding overall plan of care and treatment options, ensuring decisions are within the context of the patients values and GOCs.  PLAN: Continue Xanax as prescribed by Dr. Burr Medico Continue Miralax for constipation Continue Oxycodone for pain (refilled by Dr. Burr Medico) Ambien at  bedtime (I have called and provided authorization for medication. Patient and [name] aware this ready to be picked up). He reports his appetite remains poor however does have some minimal effect with Remeron. Would like to continue use at this time. He is aware to increase the dose to 2 tablets and we can evaluate and next visit.  I will plan to see him back in 2 weeks in collaboration with Oncology team.    Patient expressed understanding and was in agreement with this plan. He also understands that He can call the clinic at any time with any questions, concerns, or complaints.    Time Total: 25 min.   Visit consisted of counseling and education dealing with the complex and emotionally intense issues of  symptom management and palliative care in the setting of serious and potentially life-threatening illness.Greater than 50%  of this time was spent counseling and coordinating care related to the above assessment and plan.  Signed by: Alda Lea, AGPCNP-BC Palliative Medicine Team

## 2021-11-22 NOTE — Telephone Encounter (Signed)
CRITICAL VALUE STICKER  CRITICAL VALUE: Creatinine = 3.45  RECEIVER (on-site recipient of call): Yetta Glassman, CMA  DATE & TIME NOTIFIED: 11/22/21 at 2:25pm  MESSENGER (representative from lab): Lelan Pons  MD NOTIFIED: Burr Medico  TIME OF NOTIFICATION: 11/22/21 at 2:27pm  RESPONSE: Notification given to Tammi Sou, RN, for follow-up with provider and pt.

## 2021-11-22 NOTE — Patient Instructions (Signed)
[facility]  Discharge Instructions: Thank you for choosing [facility] to provide your oncology and hematology care.   If you have a lab appointment with the [facility], please go directly to the [facility] and check in at the registration area.   Wear comfortable clothing and clothing appropriate for easy access to any Portacath or PICC line.   We strive to give you quality time with your provider. You may need to reschedule your appointment if you arrive late (15 or more minutes).  Arriving late affects you and other patients whose appointments are after yours.  Also, if you miss three or more appointments without notifying the office, you may be dismissed from the clinic at the provider's discretion.      For prescription refill requests, have your pharmacy contact our office and allow 72 hours for refills to be completed.    Today you received the following chemotherapy and/or immunotherapy agents Velcade and darzalex      To help prevent nausea and vomiting after your treatment, we encourage you to take your nausea medication as directed.  BELOW ARE SYMPTOMS THAT SHOULD BE REPORTED IMMEDIATELY: *FEVER GREATER THAN 100.4 F (38 C) OR HIGHER *CHILLS OR SWEATING *NAUSEA AND VOMITING THAT IS NOT CONTROLLED WITH YOUR NAUSEA MEDICATION *UNUSUAL SHORTNESS OF BREATH *UNUSUAL BRUISING OR BLEEDING *URINARY PROBLEMS (pain or burning when urinating, or frequent urination) *BOWEL PROBLEMS (unusual diarrhea, constipation, pain near the anus) TENDERNESS IN MOUTH AND THROAT WITH OR WITHOUT PRESENCE OF ULCERS (sore throat, sores in mouth, or a toothache) UNUSUAL RASH, SWELLING OR PAIN  UNUSUAL VAGINAL DISCHARGE OR ITCHING   Items with * indicate a potential emergency and should be followed up as soon as possible or go to the Emergency Department if any problems should occur.  Please show the CHEMOTHERAPY ALERT CARD or IMMUNOTHERAPY ALERT CARD at  check-in to the Emergency Department and triage nurse.  Should you have questions after your visit or need to cancel or reschedule your appointment, please contact [facility]  Dept: [phone]  and follow the prompts.  Office hours are 8:00 a.m. to 4:30 p.m. Monday - Friday. Please note that voicemails left after 4:00 p.m. may not be returned until the following business day.  We are closed weekends and major holidays. You have access to a nurse at all times for urgent questions. Please call the main number to the clinic Dept: [phone] and follow the prompts.   For any non-urgent questions, you may also contact your provider using MyChart. We now offer e-Visits for anyone 14 and older to request care online for non-urgent symptoms. For details visit [email or web address].   Also download the MyChart app! Go to the app store, search "MyChart", open the app, select , and log in with your MyChart username and password.  Due to Covid, a mask is required upon entering the hospital/clinic. If you do not have a mask, one will be given to you upon arrival. For doctor visits, patients may have 1 support person aged 21 or older with them. For treatment visits, patients cannot have anyone with them due to current Covid guidelines and our immunocompromised population.

## 2021-11-23 ENCOUNTER — Encounter: Payer: Self-pay | Admitting: Hematology

## 2021-11-23 ENCOUNTER — Other Ambulatory Visit (HOSPITAL_COMMUNITY): Payer: Self-pay

## 2021-11-23 LAB — KAPPA/LAMBDA LIGHT CHAINS

## 2021-11-26 ENCOUNTER — Telehealth: Payer: Self-pay | Admitting: Hematology

## 2021-11-26 NOTE — Telephone Encounter (Signed)
Left message with follow-up appointments per 12/1 los.

## 2021-11-27 LAB — MULTIPLE MYELOMA PANEL, SERUM
Albumin SerPl Elph-Mcnc: 2.7 g/dL — ABNORMAL LOW (ref 2.9–4.4)
Albumin/Glob SerPl: 1.2 (ref 0.7–1.7)
Alpha 1: 0.3 g/dL (ref 0.0–0.4)
Alpha2 Glob SerPl Elph-Mcnc: 0.9 g/dL (ref 0.4–1.0)
B-Globulin SerPl Elph-Mcnc: 0.8 g/dL (ref 0.7–1.3)
Gamma Glob SerPl Elph-Mcnc: 0.3 g/dL — ABNORMAL LOW (ref 0.4–1.8)
Globulin, Total: 2.4 g/dL (ref 2.2–3.9)
IgA: 46 mg/dL — ABNORMAL LOW (ref 61–437)
IgG (Immunoglobin G), Serum: 378 mg/dL — ABNORMAL LOW (ref 603–1613)
IgM (Immunoglobulin M), Srm: 62 mg/dL (ref 15–143)
M Protein SerPl Elph-Mcnc: 0.1 g/dL — ABNORMAL HIGH
Total Protein ELP: 5.1 g/dL — ABNORMAL LOW (ref 6.0–8.5)

## 2021-11-28 ENCOUNTER — Other Ambulatory Visit: Payer: Self-pay | Admitting: Hematology

## 2021-11-29 ENCOUNTER — Other Ambulatory Visit: Payer: Self-pay

## 2021-11-29 ENCOUNTER — Inpatient Hospital Stay: Payer: Commercial Managed Care - PPO

## 2021-11-29 ENCOUNTER — Other Ambulatory Visit: Payer: Self-pay | Admitting: Hematology

## 2021-11-29 VITALS — BP 117/80 | HR 105 | Temp 98.7°F | Resp 18 | Wt 162.5 lb

## 2021-11-29 DIAGNOSIS — E8581 Light chain (AL) amyloidosis: Secondary | ICD-10-CM

## 2021-11-29 DIAGNOSIS — Z5112 Encounter for antineoplastic immunotherapy: Secondary | ICD-10-CM | POA: Diagnosis not present

## 2021-11-29 DIAGNOSIS — Z95828 Presence of other vascular implants and grafts: Secondary | ICD-10-CM | POA: Insufficient documentation

## 2021-11-29 LAB — CBC WITH DIFFERENTIAL/PLATELET
Abs Immature Granulocytes: 0.04 10*3/uL (ref 0.00–0.07)
Basophils Absolute: 0.1 10*3/uL (ref 0.0–0.1)
Basophils Relative: 1 %
Eosinophils Absolute: 0.2 10*3/uL (ref 0.0–0.5)
Eosinophils Relative: 3 %
HCT: 35 % — ABNORMAL LOW (ref 39.0–52.0)
Hemoglobin: 11.6 g/dL — ABNORMAL LOW (ref 13.0–17.0)
Immature Granulocytes: 1 %
Lymphocytes Relative: 26 %
Lymphs Abs: 2.1 10*3/uL (ref 0.7–4.0)
MCH: 29.4 pg (ref 26.0–34.0)
MCHC: 33.1 g/dL (ref 30.0–36.0)
MCV: 88.8 fL (ref 80.0–100.0)
Monocytes Absolute: 0.7 10*3/uL (ref 0.1–1.0)
Monocytes Relative: 8 %
Neutro Abs: 5 10*3/uL (ref 1.7–7.7)
Neutrophils Relative %: 61 %
Platelets: 171 10*3/uL (ref 150–400)
RBC: 3.94 MIL/uL — ABNORMAL LOW (ref 4.22–5.81)
RDW: 17.8 % — ABNORMAL HIGH (ref 11.5–15.5)
WBC: 8.1 10*3/uL (ref 4.0–10.5)
nRBC: 0 % (ref 0.0–0.2)

## 2021-11-29 LAB — COMPREHENSIVE METABOLIC PANEL
ALT: 9 U/L (ref 0–44)
AST: 10 U/L — ABNORMAL LOW (ref 15–41)
Albumin: 2.8 g/dL — ABNORMAL LOW (ref 3.5–5.0)
Alkaline Phosphatase: 103 U/L (ref 38–126)
Anion gap: 12 (ref 5–15)
BUN: 18 mg/dL (ref 8–23)
CO2: 25 mmol/L (ref 22–32)
Calcium: 8.2 mg/dL — ABNORMAL LOW (ref 8.9–10.3)
Chloride: 105 mmol/L (ref 98–111)
Creatinine, Ser: 3.19 mg/dL (ref 0.61–1.24)
GFR, Estimated: 20 mL/min — ABNORMAL LOW (ref >60)
Glucose, Bld: 109 mg/dL — ABNORMAL HIGH (ref 70–99)
Potassium: 3.7 mmol/L (ref 3.5–5.1)
Sodium: 142 mmol/L (ref 135–145)
Total Bilirubin: 0.2 mg/dL — ABNORMAL LOW (ref 0.3–1.2)
Total Protein: 5.9 g/dL — ABNORMAL LOW (ref 6.5–8.1)

## 2021-11-29 MED ORDER — BORTEZOMIB CHEMO SQ INJECTION 3.5 MG (2.5MG/ML)
1.3000 mg/m2 | Freq: Once | INTRAMUSCULAR | Status: AC
  Administered 2021-11-29: 2.5 mg via SUBCUTANEOUS
  Filled 2021-11-29: qty 1

## 2021-11-29 MED ORDER — HEPARIN SOD (PORK) LOCK FLUSH 100 UNIT/ML IV SOLN: 500.0000 [IU] | Freq: Once | INTRAVENOUS | Status: AC

## 2021-11-29 MED ORDER — SODIUM CHLORIDE 0.9% FLUSH: 10.0000 mL | Freq: Once | INTRAVENOUS | Status: AC

## 2021-11-29 MED ORDER — DEXAMETHASONE 4 MG PO TABS
20.0000 mg | ORAL_TABLET | Freq: Once | ORAL | Status: AC
  Administered 2021-11-29: 20 mg via ORAL
  Filled 2021-11-29: qty 5

## 2021-11-29 NOTE — Progress Notes (Signed)
Per Dr. Burr Medico, "OK To Treat w/elevated Scr 3.19 today and HR".  Pt is a ESRD pt therefore Scr+ will always be elevated.

## 2021-11-29 NOTE — Patient Instructions (Signed)
[facility]  Discharge Instructions: Thank you for choosing [facility] to provide your oncology and hematology care.   If you have a lab appointment with the Cancer Center, please go directly to the Cancer Center and check in at the registration area.   Wear comfortable clothing and clothing appropriate for easy access to any Portacath or PICC line.   We strive to give you quality time with your provider. You may need to reschedule your appointment if you arrive late (15 or more minutes).  Arriving late affects you and other patients whose appointments are after yours.  Also, if you miss three or more appointments without notifying the office, you may be dismissed from the clinic at the provider's discretion.      For prescription refill requests, have your pharmacy contact our office and allow 72 hours for refills to be completed.    Today you received the following chemotherapy and/or immunotherapy agents Velcade       To help prevent nausea and vomiting after your treatment, we encourage you to take your nausea medication as directed.  BELOW ARE SYMPTOMS THAT SHOULD BE REPORTED IMMEDIATELY: *FEVER GREATER THAN 100.4 F (38 C) OR HIGHER *CHILLS OR SWEATING *NAUSEA AND VOMITING THAT IS NOT CONTROLLED WITH YOUR NAUSEA MEDICATION *UNUSUAL SHORTNESS OF BREATH *UNUSUAL BRUISING OR BLEEDING *URINARY PROBLEMS (pain or burning when urinating, or frequent urination) *BOWEL PROBLEMS (unusual diarrhea, constipation, pain near the anus) TENDERNESS IN MOUTH AND THROAT WITH OR WITHOUT PRESENCE OF ULCERS (sore throat, sores in mouth, or a toothache) UNUSUAL RASH, SWELLING OR PAIN  UNUSUAL VAGINAL DISCHARGE OR ITCHING   Items with * indicate a potential emergency and should be followed up as soon as possible or go to the Emergency Department if any problems should occur.  Please show the CHEMOTHERAPY ALERT CARD or IMMUNOTHERAPY ALERT CARD at check-in to  the Emergency Department and triage nurse.  Should you have questions after your visit or need to cancel or reschedule your appointment, please contact [facility]  Dept: [phone]  and follow the prompts.  Office hours are 8:00 a.m. to 4:30 p.m. Monday - Friday. Please note that voicemails left after 4:00 p.m. may not be returned until the following business day.  We are closed weekends and major holidays. You have access to a nurse at all times for urgent questions. Please call the main number to the clinic Dept: [phone] and follow the prompts.   For any non-urgent questions, you may also contact your provider using MyChart. We now offer e-Visits for anyone 18 and older to request care online for non-urgent symptoms. For details visit [email or web address].   Also download the MyChart app! Go to the app store, search "MyChart", open the app, select [facility], and log in with your MyChart username and password.  Due to Covid, a mask is required upon entering the hospital/clinic. If you do not have a mask, one will be given to you upon arrival. For doctor visits, patients may have 1 support person aged 18 or older with them. For treatment visits, patients cannot have anyone with them due to current Covid guidelines and our immunocompromised population.   

## 2021-11-29 NOTE — Progress Notes (Signed)
Dr.Feng orders, checked oral cytoxan dose with pt before he took it, verified with Junius Creamer, RN

## 2021-12-04 ENCOUNTER — Telehealth: Payer: Self-pay

## 2021-12-04 ENCOUNTER — Other Ambulatory Visit (HOSPITAL_COMMUNITY): Payer: Self-pay

## 2021-12-04 NOTE — Telephone Encounter (Signed)
[name], Mr. Teat's roommate, called informing us that Mr. Romas is not feeling well and his very congested. She stated that this happens once a year and that he is normally give an antibiotic/ Z-Pak. She was asking if we would be able to prescribe him this over the phone. She stated that they normally go to Urgent Care as Mr. Hyle doesn't have a PCP. I notified Lexine Baton, NP. She stated that they would need to go to Urgent Care to make sure he is evaluated and ruled out for other possible diagnoses. [name] verbalized understanding. All questions addressed. Advised to call back with any questions/concerns.

## 2021-12-06 ENCOUNTER — Encounter: Payer: Self-pay | Admitting: Hematology

## 2021-12-06 ENCOUNTER — Inpatient Hospital Stay (HOSPITAL_BASED_OUTPATIENT_CLINIC_OR_DEPARTMENT_OTHER): Payer: Commercial Managed Care - PPO | Admitting: Nurse Practitioner

## 2021-12-06 ENCOUNTER — Other Ambulatory Visit: Payer: Self-pay

## 2021-12-06 ENCOUNTER — Inpatient Hospital Stay (HOSPITAL_BASED_OUTPATIENT_CLINIC_OR_DEPARTMENT_OTHER): Payer: Commercial Managed Care - PPO | Admitting: Hematology

## 2021-12-06 ENCOUNTER — Inpatient Hospital Stay: Payer: Commercial Managed Care - PPO

## 2021-12-06 VITALS — BP 127/71 | HR 95 | Temp 98.0°F | Resp 18 | Wt 168.5 lb

## 2021-12-06 DIAGNOSIS — Z5112 Encounter for antineoplastic immunotherapy: Secondary | ICD-10-CM | POA: Diagnosis not present

## 2021-12-06 DIAGNOSIS — Z7189 Other specified counseling: Secondary | ICD-10-CM

## 2021-12-06 DIAGNOSIS — E8581 Light chain (AL) amyloidosis: Secondary | ICD-10-CM

## 2021-12-06 DIAGNOSIS — Z515 Encounter for palliative care: Secondary | ICD-10-CM | POA: Diagnosis not present

## 2021-12-06 DIAGNOSIS — R531 Weakness: Secondary | ICD-10-CM

## 2021-12-06 DIAGNOSIS — K5900 Constipation, unspecified: Secondary | ICD-10-CM | POA: Diagnosis not present

## 2021-12-06 DIAGNOSIS — R634 Abnormal weight loss: Secondary | ICD-10-CM

## 2021-12-06 LAB — COMPREHENSIVE METABOLIC PANEL
ALT: 6 U/L (ref 0–44)
AST: 8 U/L — ABNORMAL LOW (ref 15–41)
Albumin: 2.6 g/dL — ABNORMAL LOW (ref 3.5–5.0)
Alkaline Phosphatase: 83 U/L (ref 38–126)
Anion gap: 10 (ref 5–15)
BUN: 22 mg/dL (ref 8–23)
CO2: 25 mmol/L (ref 22–32)
Calcium: 8.3 mg/dL — ABNORMAL LOW (ref 8.9–10.3)
Chloride: 105 mmol/L (ref 98–111)
Creatinine, Ser: 3.69 mg/dL (ref 0.61–1.24)
GFR, Estimated: 17 mL/min — ABNORMAL LOW (ref >60)
Glucose, Bld: 118 mg/dL — ABNORMAL HIGH (ref 70–99)
Potassium: 4 mmol/L (ref 3.5–5.1)
Sodium: 140 mmol/L (ref 135–145)
Total Bilirubin: 0.3 mg/dL (ref 0.3–1.2)
Total Protein: 5.5 g/dL — ABNORMAL LOW (ref 6.5–8.1)

## 2021-12-06 LAB — CBC WITH DIFFERENTIAL/PLATELET
Abs Immature Granulocytes: 0.04 10*3/uL (ref 0.00–0.07)
Basophils Absolute: 0.1 10*3/uL (ref 0.0–0.1)
Basophils Relative: 1 %
Eosinophils Absolute: 0.2 10*3/uL (ref 0.0–0.5)
Eosinophils Relative: 3 %
HCT: 31.1 % — ABNORMAL LOW (ref 39.0–52.0)
Hemoglobin: 10.2 g/dL — ABNORMAL LOW (ref 13.0–17.0)
Immature Granulocytes: 1 %
Lymphocytes Relative: 21 %
Lymphs Abs: 1.6 10*3/uL (ref 0.7–4.0)
MCH: 29.2 pg (ref 26.0–34.0)
MCHC: 32.8 g/dL (ref 30.0–36.0)
MCV: 89.1 fL (ref 80.0–100.0)
Monocytes Absolute: 0.6 10*3/uL (ref 0.1–1.0)
Monocytes Relative: 8 %
Neutro Abs: 4.9 10*3/uL (ref 1.7–7.7)
Neutrophils Relative %: 66 %
Platelets: 126 10*3/uL — ABNORMAL LOW (ref 150–400)
RBC: 3.49 MIL/uL — ABNORMAL LOW (ref 4.22–5.81)
RDW: 17.7 % — ABNORMAL HIGH (ref 11.5–15.5)
WBC: 7.3 10*3/uL (ref 4.0–10.5)
nRBC: 0 % (ref 0.0–0.2)

## 2021-12-06 MED ORDER — ACETAMINOPHEN 325 MG PO TABS
650.0000 mg | ORAL_TABLET | Freq: Once | ORAL | Status: AC
  Administered 2021-12-06: 650 mg via ORAL
  Filled 2021-12-06: qty 2

## 2021-12-06 MED ORDER — DARATUMUMAB-HYALURONIDASE-FIHJ 1800-30000 MG-UT/15ML ~~LOC~~ SOLN
1800.0000 mg | Freq: Once | SUBCUTANEOUS | Status: AC
  Administered 2021-12-06: 1800 mg via SUBCUTANEOUS
  Filled 2021-12-06: qty 15

## 2021-12-06 MED ORDER — AMOXICILLIN-POT CLAVULANATE 875-125 MG PO TABS: 1.0000 | ORAL_TABLET | Freq: Two times a day (BID) | ORAL | 0 refills | Status: DC

## 2021-12-06 MED ORDER — DIPHENHYDRAMINE HCL 25 MG PO CAPS
50.0000 mg | ORAL_CAPSULE | Freq: Once | ORAL | Status: AC
  Administered 2021-12-06: 50 mg via ORAL
  Filled 2021-12-06: qty 2

## 2021-12-06 MED ORDER — BORTEZOMIB CHEMO SQ INJECTION 3.5 MG (2.5MG/ML)
1.3000 mg/m2 | Freq: Once | INTRAMUSCULAR | Status: AC
  Administered 2021-12-06: 2.5 mg via SUBCUTANEOUS
  Filled 2021-12-06: qty 1

## 2021-12-06 MED ORDER — DEXAMETHASONE 4 MG PO TABS
20.0000 mg | ORAL_TABLET | Freq: Once | ORAL | Status: AC
  Administered 2021-12-06: 20 mg via ORAL
  Filled 2021-12-06: qty 5

## 2021-12-06 NOTE — Progress Notes (Signed)
[place]   Telephone:(336) [phone] Fax:(336) [phone]   Clinic Follow up Note   Patient Care Team: Pcp, No as PCP - General Buntin, Lavonda Jumbo, RN as Registered Nurse Pickenpack-Cousar, Carlena Sax, NP as Nurse Practitioner (Nurse Practitioner)  Date of Service:  12/06/2021  CHIEF COMPLAINT: f/u of AL amyloidosis with multiple organs involvement   CURRENT THERAPY:  Weekly Dexa, Velcade and Cytoxan, started 09/10/21 -daratumumab injection added 09/27/21  ASSESSMENT & PLAN:  Caleb Simmons is a 70 y.o. male with   1. AL amyloidosis with renal, cardiac and neuro involvement  -initially presented with weakness and SOB; lab work up showed BUN of 44, Cr 6.5, significant proteinuria/RBC on UA. Right renal biopsy on 08/28/21 revealing AL amyloidosis, lambda light chain composition. 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 Both serum and urine lamda Free light chains significantly elevated. -He started hemodialysis in the hospital, and currently on Monday Wednesday Friday schedule. -Due to the multiorgan involvement, with significant CHF (EF 45-50%) and renal failure, his prognosis is very poor. -He began weekly oral Cytoxan, dexa and Velcade injection while inpatient on 09/10/21.  He is tolerating well so far  -He began daratumumab injection 09/27/21, will continue every week with breaks for holidays  -his light chain levels showed improvement on repeat labs on 10/04/21. (Last two labs were not able to be processed due to being grossly lipemic) -he is tolerating chemo well, dexa decreased from 65m to 236mweekly due to insomnia  -not a candidate for biphosphonate due to renal failure   2. Symptom management: Nausea, Insomnia, Low appetite, constipation, anxiety -zofran helps his nausea -he was prescribed remeron and ambien on 10/18/21 by NP NiLexine BatonThey do not feel the remeron is helping. I advised he can increase the dose; the highest dose is 30 mg -he is  sleeping better with Ambien and Xanax   3. Orthostatic Hypotension and CHF  -Likely secondary to amyloidosis with neuro involvement  -He had a significant symptomatic orthostasis, SBP drops to 70's when he stands up -he met with Dr. McAundra Dubinn 10/23/21 and was started on midodrine.   4. 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 ESRD on HD, secondary to #1 -he is on dialysis  Mon, Wed, Fri. -I spoke with his nephrologist to see if he can do less dialysis since his amyloidosis has improved. At this time, it's not recommended, but maybe at a later time.     PLAN: -Proceed with weekly CyBorD and Dara injection every 2 weeks  -I called in augmentin for his sinus infection -lab, f/u, and CyBorD and Dara injection on 12/27/21, postpone due to the holidays    No problem-specific Assessment & Plan notes found for this encounter.   INTERVAL HISTORY:  Caleb Simmons here for a follow up of AL amyloidosis. He was last seen by me on 11/22/21. He presents to the clinic accompanied by his wife. He is complaining of bothersome sinus problems-- he has facial pain that goes down to his teeth, light is bothersome to his eyes. He denies fevers, Covid-positive tests, and nasal discharge. They note he gets these sinus infections annually; he usually receives antibiotics and recovers well. He reports improvement in his sleep with the increased Ambien dose.   All  other systems were reviewed with the patient and are negative.  MEDICAL HISTORY:  History reviewed. No pertinent past medical history.  SURGICAL HISTORY: Past Surgical History:  Procedure Laterality Date   APPENDECTOMY     IR EMBO ART  VEN HEMORR LYMPH EXTRAV  INC GUIDE ROADMAPPING  08/30/2021   IR FLUORO GUIDE CV LINE RIGHT  08/30/2021   IR FLUORO  GUIDE CV LINE RIGHT  09/18/2021   IR RENAL SELECTIVE  UNI INC S&I MOD SED  08/31/2021   IR US GUIDE VASC ACCESS RIGHT  08/30/2021   IR US GUIDE VASC ACCESS RIGHT  08/30/2021   IR US GUIDE VASC ACCESS RIGHT  09/18/2021    I have reviewed the social history and family history with the patient and they are unchanged from previous note.  ALLERGIES:  has No Known Allergies.  MEDICATIONS:  Current Outpatient Medications  Medication Sig Dispense Refill   amoxicillin-clavulanate (AUGMENTIN) 875-125 MG tablet Take 1 tablet by mouth 2 (two) times daily. 14 tablet 0   acetaminophen (TYLENOL) 325 MG tablet Take 2 tablets (650 mg total) by mouth every 6 (six) hours as needed for moderate pain (headache).     ALPRAZolam (XANAX) 0.5 MG tablet Take 1 tablet (0.5 mg total) by mouth 2 (two) times daily as needed for sleep or anxiety. 60 tablet 0   cyclophosphamide (CYTOXAN) 50 MG capsule Take 9 capsules (450 mg total) by mouth once a week. Take with food to minimize GI upset. Take early in the day and maintain hydration. 36 capsule 1   folic acid (FOLVITE) 1 MG tablet TAKE 2 TABLETS BY MOUTH EVERY DAY 60 tablet 0   midodrine (PROAMATINE) 5 MG tablet Take 1 tablet (5 mg total) by mouth 3 (three) times daily with meals. 270 tablet 3   mirtazapine (REMERON) 15 MG tablet TAKE 1 TABLET BY MOUTH AT BEDTIME 30 tablet 0   ondansetron (ZOFRAN) 4 MG tablet Take 1 tablet (4 mg total) by mouth every 6 (six) hours as needed for nausea or vomiting. 30 tablet 2   oxyCODONE (OXY IR/ROXICODONE) 5 MG immediate release tablet Take 1 tablet (5 mg total) by mouth every 6 (six) hours as needed for severe pain. 45 tablet 0   pantoprazole (PROTONIX) 40 MG tablet TAKE 1 TABLET BY MOUTH 2 TIMES DAILY BEFORE A meal 60 tablet 0   prochlorperazine (COMPAZINE) 5 MG tablet Take 1 tablet (5 mg total) by mouth every 6 (six) hours as needed for nausea or vomiting. 30 tablet 2   tamsulosin (FLOMAX) 0.4 MG CAPS capsule TAKE 1 CAPSULE BY MOUTH EVERY DAY  30 capsule 0   zolpidem (AMBIEN) 5 MG tablet Take 1 tablet (5 mg total) by mouth at bedtime as needed for sleep. 30 tablet 0   No current facility-administered medications for this visit.   Facility-Administered Medications Ordered in Other Visits  Medication Dose Route Frequency Provider Last Rate Last Admin   heparin lock flush 100 unit/mL  500 Units Intracatheter Once Truitt Merle, MD       sodium chloride flush (NS) 0.9 % injection 10 mL  10 mL Intracatheter Once Truitt Merle, MD        PHYSICAL EXAMINATION: ECOG PERFORMANCE STATUS: 3 - Symptomatic, >50% confined to bed  Vitals:   12/06/21 0812  BP: 127/71  Pulse: 95  Resp: 18  Temp: 98 F (36.7 C)  SpO2: 99%   Wt Readings from Last 3 Encounters:  12/06/21 168 lb 8 oz (76.4 kg)  11/29/21 162 lb 8 oz (73.7 kg)  11/22/21 163 lb 1 oz (74 kg)     GENERAL:alert, no distress and comfortable SKIN: skin color normal, no rashes or significant lesions EYES: normal, Conjunctiva are pink and non-injected, sclera clear  NEURO: alert & oriented x 3 with fluent speech  LABORATORY DATA:  I have reviewed the data as listed CBC Latest Ref Rng & Units 12/06/2021 11/29/2021 11/22/2021  WBC 4.0 - 10.5 K/uL 7.3 8.1 8.2  Hemoglobin 13.0 - 17.0 g/dL 10.2(L) 11.6(L) 11.0(L)  Hematocrit 39.0 - 52.0 % 31.1(L) 35.0(L) 33.7(L)  Platelets 150 - 400 K/uL 126(L) 171 200     CMP Latest Ref Rng & Units 12/06/2021 11/29/2021 11/22/2021  Glucose 70 - 99 mg/dL 118(H) 109(H) 139(H)  BUN 8 - 23 mg/dL _0 Creatinine 0.61 - 1.24 mg/dL 3.69(HH) 3.19(HH) 3.45(HH)  Sodium 135 - 145 mmol/L 140 142 138  Potassium 3.5 - 5.1 mmol/L 4.0 3.7 3.7  Chloride 98 - 111 mmol/L 105 105 102  CO2 22 - 32 mmol/L _1 Calcium 8.9 - 10.3 mg/dL 8.3(L) 8.2(L) 8.3(L)  Total Protein 6.5 - 8.1 g/dL 5.5(L) 5.9(L) 6.1(L)  Total Bilirubin 0.3 - 1.2 mg/dL 0.3 0.2(L) <0.2(L)  Alkaline Phos 38 - 126 U/L 83 103 101  AST 15 - 41 U/L 8(L) 10(L) 10(L)  ALT 0 - 44 U/L _2 RADIOGRAPHIC STUDIES: I have personally reviewed the radiological images as listed and agreed with the findings in the report. No results found.    No orders of the defined types were placed in this encounter.  All questions were answered. The patient knows to call the clinic with any problems, questions or concerns. No barriers to learning was detected. The total time spent in the appointment was 30 minutes.     Truitt Merle, MD 12/06/2021   I, Wilburn Mylar, am acting as scribe for Truitt Merle, MD.   I have reviewed the above documentation for accuracy and completeness, and I agree with the above.

## 2021-12-06 NOTE — Progress Notes (Signed)
Pt. medicated with Cytoxan 450 mg po per orders and verified by Joanell Rising.

## 2021-12-06 NOTE — Patient Instructions (Signed)
[facility]  Discharge Instructions: Thank you for choosing [facility] to provide your oncology and hematology care.   If you have a lab appointment with the [facility], please go directly to the [facility] and check in at the registration area.   Wear comfortable clothing and clothing appropriate for easy access to any Portacath or PICC line.   We strive to give you quality time with your provider. You may need to reschedule your appointment if you arrive late (15 or more minutes).  Arriving late affects you and other patients whose appointments are after yours.  Also, if you miss three or more appointments without notifying the office, you may be dismissed from the clinic at the providers discretion.      For prescription refill requests, have your pharmacy contact our office and allow 72 hours for refills to be completed.    Today you received the following chemotherapy and/or immunotherapy agents: Bortezomib (Velcade) and Daratumumab (Darzalex Faspro).   To help prevent nausea and vomiting after your treatment, we encourage you to take your nausea medication as directed.  BELOW ARE SYMPTOMS THAT SHOULD BE REPORTED IMMEDIATELY: *FEVER GREATER THAN 100.4 F (38 C) OR HIGHER *CHILLS OR SWEATING *NAUSEA AND VOMITING THAT IS NOT CONTROLLED WITH YOUR NAUSEA MEDICATION *UNUSUAL SHORTNESS OF BREATH *UNUSUAL BRUISING OR BLEEDING *URINARY PROBLEMS (pain or burning when urinating, or frequent urination) *BOWEL PROBLEMS (unusual diarrhea, constipation, pain near the anus) TENDERNESS IN MOUTH AND THROAT WITH OR WITHOUT PRESENCE OF ULCERS (sore throat, sores in mouth, or a toothache) UNUSUAL RASH, SWELLING OR PAIN  UNUSUAL VAGINAL DISCHARGE OR ITCHING   Items with * indicate a potential emergency and should be followed up as soon as possible or go to the Emergency Department if any problems should occur.  Please show the CHEMOTHERAPY ALERT CARD  or IMMUNOTHERAPY ALERT CARD at check-in to the Emergency Department and triage nurse.  Should you have questions after your visit or need to cancel or reschedule your appointment, please contact [facility]  Dept: [phone]  and follow the prompts.  Office hours are 8:00 a.m. to 4:30 p.m. Monday - Friday. Please note that voicemails left after 4:00 p.m. may not be returned until the following business day.  We are closed weekends and major holidays. You have access to a nurse at all times for urgent questions. Please call the main number to the clinic Dept: [phone] and follow the prompts.   For any non-urgent questions, you may also contact your provider using MyChart. We now offer e-Visits for anyone 43 and older to request care online for non-urgent symptoms. For details visit [email or web address].   Also download the MyChart app! Go to the app store, search "MyChart", open the app, select [facility], and log in with your MyChart username and password.  Due to Covid, a mask is required upon entering the hospital/clinic. If you do not have a mask, one will be given to you upon arrival. For doctor visits, patients may have 1 support person aged 46 or older with them. For treatment visits, patients cannot have anyone with them due to current Covid guidelines and our immunocompromised population.

## 2021-12-06 NOTE — Progress Notes (Signed)
[facility]  Telephone:(336) [phone] Fax:(336) [phone]   Name: Caleb Simmons Date: 12/06/2021 MRN: [id number]  DOB: [birth year]  Patient Care Team: Pcp, No as PCP - General Buntin, Lavonda Jumbo, RN as Registered Nurse Pickenpack-Cousar, Carlena Sax, NP as Nurse Practitioner (Nurse Practitioner)    INTERVAL HISTORY: NASHUA Simmons is a 70 y.o. 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  Palliative visit post-hospital follow-up for ongoing goals of care discussions and symptom management.    SOCIAL HISTORY:     reports that he has been smoking cigarettes. He has a 6.25 pack-year smoking history. He has never used smokeless tobacco.  ADVANCE DIRECTIVES:  Patient reports completed document.  Son [name], [name]. is his healthcare power of attorney.  CODE STATUS: Full code  PAST MEDICAL HISTORY:No past medical history on file.   HEMATOLOGY/ONCOLOGY HISTORY:  Oncology History   No history exists.    ALLERGIES:  has No Known Allergies.  MEDICATIONS:  Current Outpatient Medications  Medication Sig Dispense Refill   acetaminophen (TYLENOL) 325 MG tablet Take 2 tablets (650 mg total) by mouth every 6 (six) hours as needed for moderate pain (headache).     ALPRAZolam (XANAX) 0.5 MG tablet Take 1 tablet (0.5 mg total) by mouth 2 (two) times daily as needed for sleep or anxiety. 60 tablet 0   amoxicillin-clavulanate (AUGMENTIN) 875-125 MG tablet Take 1 tablet by mouth 2 (two) times daily. 14 tablet 0   cyclophosphamide (CYTOXAN) 50 MG capsule Take 9 capsules (450 mg total) by mouth once a week. Take with food to minimize GI upset. Take early in the day and maintain hydration. 36 capsule 1   folic acid (FOLVITE) 1 MG tablet TAKE 2 TABLETS BY MOUTH EVERY DAY 60 tablet 0   midodrine (PROAMATINE) 5 MG tablet Take 1 tablet (5 mg  total) by mouth 3 (three) times daily with meals. 270 tablet 3   mirtazapine (REMERON) 15 MG tablet TAKE 1 TABLET BY MOUTH AT BEDTIME 30 tablet 0   ondansetron (ZOFRAN) 4 MG tablet Take 1 tablet (4 mg total) by mouth every 6 (six) hours as needed for nausea or vomiting. 30 tablet 2   oxyCODONE (OXY IR/ROXICODONE) 5 MG immediate release tablet Take 1 tablet (5 mg total) by mouth every 6 (six) hours as needed for severe pain. 45 tablet 0   pantoprazole (PROTONIX) 40 MG tablet TAKE 1 TABLET BY MOUTH 2 TIMES DAILY BEFORE A meal 60 tablet 0   prochlorperazine (COMPAZINE) 5 MG tablet Take 1 tablet (5 mg total) by mouth every 6 (six) hours as needed for nausea or vomiting. 30 tablet 2   tamsulosin (FLOMAX) 0.4 MG CAPS capsule TAKE 1 CAPSULE BY MOUTH EVERY DAY 30 capsule 0   zolpidem (AMBIEN) 5 MG tablet Take 1 tablet (5 mg total) by mouth at bedtime as needed for sleep. 30 tablet 0   No current facility-administered medications for this visit.   Facility-Administered Medications Ordered in Other Visits  Medication Dose Route Frequency Provider Last Rate Last Admin   bortezomib SQ (VELCADE) chemo injection (2.5mg /mL concentration) 2.5 mg  1.3 mg/m2 (Treatment Plan Recorded) Subcutaneous Once Truitt Merle, MD       daratumumab-hyaluronidase-fihj [facility] FASPRO) 1800-30000 MG-UT/15ML chemo SQ injection 1,800 mg  1,800 mg Subcutaneous Once Truitt Merle, MD       heparin lock flush 100 unit/mL  500  Units Intracatheter Once Truitt Merle, MD       sodium chloride flush (NS) 0.9 % injection 10 mL  10 mL Intracatheter Once Truitt Merle, MD        VITAL SIGNS: There were no vitals taken for this visit. There were no vitals filed for this visit.  Estimated body mass index is 24.18 kg/m as calculated from the following:   Height as of 11/01/21: 5\' 10"  (1.778 m).   Weight as of an earlier encounter on 12/06/21: 168 lb 8 oz (76.4 kg).  LABS: CBC:    Component Value Date/Time   WBC 7.3 12/06/2021 0752   HGB 10.2 (L)  12/06/2021 0752   HGB 9.1 (L) 09/27/2021 0733   HCT 31.1 (L) 12/06/2021 0752   PLT 126 (L) 12/06/2021 0752   PLT 223 09/27/2021 0733   MCV 89.1 12/06/2021 0752   NEUTROABS 4.9 12/06/2021 0752   LYMPHSABS 1.6 12/06/2021 0752   MONOABS 0.6 12/06/2021 0752   EOSABS 0.2 12/06/2021 0752   BASOSABS 0.1 12/06/2021 0752   Comprehensive Metabolic Panel:    Component Value Date/Time   NA 140 12/06/2021 0752   K 4.0 12/06/2021 0752   CL 105 12/06/2021 0752   CO2 25 12/06/2021 0752   BUN 22 12/06/2021 0752   CREATININE 3.69 (HH) 12/06/2021 0752   CREATININE 3.38 (HH) 10/04/2021 1201   GLUCOSE 118 (H) 12/06/2021 0752   CALCIUM 8.3 (L) 12/06/2021 0752   AST 8 (L) 12/06/2021 0752   AST 10 (L) 10/04/2021 1201   ALT 6 12/06/2021 0752   ALT 8 10/04/2021 1201   ALKPHOS 83 12/06/2021 0752   BILITOT 0.3 12/06/2021 0752   BILITOT 0.4 10/04/2021 1201   PROT 5.5 (L) 12/06/2021 0752   ALBUMIN 2.6 (L) 12/06/2021 0752     PERFORMANCE STATUS (ECOG) : 1 - Symptomatic but completely ambulatory   Physical Exam General: NAD Cardiovascular: regular rate and rhythm Pulmonary: clear ant fields Abdomen: soft, nontender, + bowel sounds Extremities: no edema, no joint deformities Skin: no rashes Neurological: [id number]  IMPRESSION:  Mr. Turnage seen in infusion. He also saw Dr. Burr Medico today and reports visit went well. No acute distress noted. Denies pain at this time.   Insomnia Kentaro shares that he is sleeping much better. He is taking Ambien and Xanax as needed. Reports he is sleeping through the night without waking up or if he does is able to fall back to sleep which he is appreciative of. Reports this has also allowed him to increase his ability to do things around the home during the day as he feels rested.   2. Pain Pain is controlled and well managed.   3. Constipation States he is having daily bowel movements and confirms he is taking Miralax daily. Does notice when he missed a dose he may not  have a bowel movement until the next day.   4. Poor nutrition/oral intake/Weight loss Kayton is appreciative of his increased appetite. He reports he has been able to eat all of the foods that he once loved. He does endorse decrease appetite around his treatment days however this resolves within 24 hours. Feels the increase in mirtazapine is helping. Discussed continuing current dosing. His weight has increased from 163.1lb (12/1) to 168.8lb on (12/15)  All questions answered and support provided.   PLAN: Continue Xanax as prescribed Continue Miralax for constipation Continue Oxycodone for pain (refilled by Dr. Burr Medico) Ambien at bedtime, reports he has been able to sleep during the  night with increased energy during the day.  Appetite has improved. Gained 5lbs since last visit. Will continue on 30 mg of mirtazapine.  I will plan to see him back in 2 weeks in collaboration with Oncology team.    Patient expressed understanding and was in agreement with this plan. He also understands that He can call the clinic at any time with any questions, concerns, or complaints.    Time Total: 25 min.   Visit consisted of counseling and education dealing with the complex and emotionally intense issues of symptom management and palliative care in the setting of serious and potentially life-threatening illness.Greater than 50%  of this time was spent counseling and coordinating care related to the above assessment and plan.  Signed by: Alda Lea, AGPCNP-BC Palliative Medicine Team

## 2021-12-06 NOTE — Progress Notes (Signed)
Per Dr. Burr Medico, "OK to Treat w/elevated Scr+3.69 today"

## 2021-12-10 ENCOUNTER — Telehealth: Payer: Self-pay

## 2021-12-10 ENCOUNTER — Other Ambulatory Visit (HOSPITAL_COMMUNITY): Payer: Self-pay

## 2021-12-10 ENCOUNTER — Other Ambulatory Visit: Payer: Self-pay | Admitting: Nurse Practitioner

## 2021-12-10 MED ORDER — MIRTAZAPINE 30 MG PO TABS: 30.0000 mg | ORAL_TABLET | Freq: Every day | ORAL | 1 refills | Status: DC

## 2021-12-10 NOTE — Telephone Encounter (Signed)
Caleb Simmons called our office to inform us that [name] was close to running out and that he would need a new prescription per the pharmacy. Lexine Baton, NP notified. All questions answered.

## 2021-12-11 ENCOUNTER — Telehealth: Payer: Self-pay | Admitting: Hematology

## 2021-12-11 ENCOUNTER — Telehealth: Payer: Self-pay

## 2021-12-11 NOTE — Telephone Encounter (Signed)
Called to update Caleb Simmons and Caleb Simmons about the Remeron prescription. I explained that the new prescription is for 30mg  tablets, so he only needs to take one at a time, rather than the 2 tablets he was originally taking. Understanding verbalized.

## 2021-12-11 NOTE — Telephone Encounter (Signed)
Left message with follow-up appointments per 12/15 los.

## 2021-12-12 ENCOUNTER — Other Ambulatory Visit: Payer: Self-pay | Admitting: Hematology

## 2021-12-13 ENCOUNTER — Other Ambulatory Visit: Payer: Self-pay | Admitting: Nurse Practitioner

## 2021-12-13 MED ORDER — ZOLPIDEM TARTRATE 5 MG PO TABS
5.0000 mg | ORAL_TABLET | Freq: Every evening | ORAL | 0 refills | Status: DC | PRN
Start: 2021-12-13 — End: 2021-12-27

## 2021-12-27 ENCOUNTER — Inpatient Hospital Stay: Payer: Commercial Managed Care - PPO

## 2021-12-27 ENCOUNTER — Inpatient Hospital Stay: Payer: Commercial Managed Care - PPO | Attending: Hematology

## 2021-12-27 ENCOUNTER — Inpatient Hospital Stay (HOSPITAL_BASED_OUTPATIENT_CLINIC_OR_DEPARTMENT_OTHER): Payer: Commercial Managed Care - PPO | Admitting: Nurse Practitioner

## 2021-12-27 ENCOUNTER — Other Ambulatory Visit: Payer: Self-pay

## 2021-12-27 ENCOUNTER — Other Ambulatory Visit: Payer: Commercial Managed Care - PPO

## 2021-12-27 ENCOUNTER — Inpatient Hospital Stay (HOSPITAL_BASED_OUTPATIENT_CLINIC_OR_DEPARTMENT_OTHER): Payer: Commercial Managed Care - PPO | Admitting: Hematology

## 2021-12-27 ENCOUNTER — Telehealth: Payer: Self-pay

## 2021-12-27 ENCOUNTER — Encounter: Payer: Self-pay | Admitting: Hematology

## 2021-12-27 VITALS — BP 105/72 | HR 112 | Temp 98.8°F | Resp 18 | Ht 70.0 in | Wt 170.0 lb

## 2021-12-27 VITALS — HR 96

## 2021-12-27 DIAGNOSIS — C9000 Multiple myeloma not having achieved remission: Secondary | ICD-10-CM | POA: Diagnosis present

## 2021-12-27 DIAGNOSIS — Z515 Encounter for palliative care: Secondary | ICD-10-CM | POA: Diagnosis not present

## 2021-12-27 DIAGNOSIS — R634 Abnormal weight loss: Secondary | ICD-10-CM

## 2021-12-27 DIAGNOSIS — N186 End stage renal disease: Secondary | ICD-10-CM | POA: Diagnosis not present

## 2021-12-27 DIAGNOSIS — I951 Orthostatic hypotension: Secondary | ICD-10-CM | POA: Insufficient documentation

## 2021-12-27 DIAGNOSIS — I43 Cardiomyopathy in diseases classified elsewhere: Secondary | ICD-10-CM

## 2021-12-27 DIAGNOSIS — K5900 Constipation, unspecified: Secondary | ICD-10-CM | POA: Diagnosis not present

## 2021-12-27 DIAGNOSIS — Z5112 Encounter for antineoplastic immunotherapy: Secondary | ICD-10-CM | POA: Insufficient documentation

## 2021-12-27 DIAGNOSIS — D631 Anemia in chronic kidney disease: Secondary | ICD-10-CM | POA: Insufficient documentation

## 2021-12-27 DIAGNOSIS — G4700 Insomnia, unspecified: Secondary | ICD-10-CM

## 2021-12-27 DIAGNOSIS — Z992 Dependence on renal dialysis: Secondary | ICD-10-CM | POA: Insufficient documentation

## 2021-12-27 DIAGNOSIS — G893 Neoplasm related pain (acute) (chronic): Secondary | ICD-10-CM

## 2021-12-27 DIAGNOSIS — E8581 Light chain (AL) amyloidosis: Secondary | ICD-10-CM | POA: Insufficient documentation

## 2021-12-27 DIAGNOSIS — I509 Heart failure, unspecified: Secondary | ICD-10-CM | POA: Insufficient documentation

## 2021-12-27 DIAGNOSIS — E854 Organ-limited amyloidosis: Secondary | ICD-10-CM

## 2021-12-27 LAB — CBC WITH DIFFERENTIAL/PLATELET
Abs Immature Granulocytes: 0.03 10*3/uL (ref 0.00–0.07)
Basophils Absolute: 0.1 10*3/uL (ref 0.0–0.1)
Basophils Relative: 1 %
Eosinophils Absolute: 0.2 10*3/uL (ref 0.0–0.5)
Eosinophils Relative: 2 %
HCT: 30 % — ABNORMAL LOW (ref 39.0–52.0)
Hemoglobin: 9.8 g/dL — ABNORMAL LOW (ref 13.0–17.0)
Immature Granulocytes: 0 %
Lymphocytes Relative: 17 %
Lymphs Abs: 1.5 10*3/uL (ref 0.7–4.0)
MCH: 29.4 pg (ref 26.0–34.0)
MCHC: 32.7 g/dL (ref 30.0–36.0)
MCV: 90.1 fL (ref 80.0–100.0)
Monocytes Absolute: 0.8 10*3/uL (ref 0.1–1.0)
Monocytes Relative: 9 %
Neutro Abs: 6.4 10*3/uL (ref 1.7–7.7)
Neutrophils Relative %: 71 %
Platelets: 215 10*3/uL (ref 150–400)
RBC: 3.33 MIL/uL — ABNORMAL LOW (ref 4.22–5.81)
RDW: 16.5 % — ABNORMAL HIGH (ref 11.5–15.5)
WBC: 9 10*3/uL (ref 4.0–10.5)
nRBC: 0 % (ref 0.0–0.2)

## 2021-12-27 LAB — COMPREHENSIVE METABOLIC PANEL
ALT: 6 U/L (ref 0–44)
AST: 9 U/L — ABNORMAL LOW (ref 15–41)
Albumin: 3.3 g/dL — ABNORMAL LOW (ref 3.5–5.0)
Alkaline Phosphatase: 93 U/L (ref 38–126)
Anion gap: 7 (ref 5–15)
BUN: 29 mg/dL — ABNORMAL HIGH (ref 8–23)
CO2: 26 mmol/L (ref 22–32)
Calcium: 8.7 mg/dL — ABNORMAL LOW (ref 8.9–10.3)
Chloride: 104 mmol/L (ref 98–111)
Creatinine, Ser: 3.86 mg/dL (ref 0.61–1.24)
GFR, Estimated: 16 mL/min — ABNORMAL LOW (ref >60)
Glucose, Bld: 97 mg/dL (ref 70–99)
Potassium: 4.6 mmol/L (ref 3.5–5.1)
Sodium: 137 mmol/L (ref 135–145)
Total Bilirubin: 0.1 mg/dL — ABNORMAL LOW (ref 0.3–1.2)
Total Protein: 5.9 g/dL — ABNORMAL LOW (ref 6.5–8.1)

## 2021-12-27 MED ORDER — DARATUMUMAB-HYALURONIDASE-FIHJ 1800-30000 MG-UT/15ML ~~LOC~~ SOLN
1800.0000 mg | Freq: Once | SUBCUTANEOUS | Status: AC
  Administered 2021-12-27: 1800 mg via SUBCUTANEOUS
  Filled 2021-12-27: qty 15

## 2021-12-27 MED ORDER — MIRTAZAPINE 30 MG PO TABS: 30.0000 mg | ORAL_TABLET | Freq: Every day | ORAL | 1 refills | Status: DC

## 2021-12-27 MED ORDER — DEXAMETHASONE 4 MG PO TABS
20.0000 mg | ORAL_TABLET | Freq: Once | ORAL | Status: AC
  Administered 2021-12-27: 20 mg via ORAL
  Filled 2021-12-27: qty 5

## 2021-12-27 MED ORDER — ZOLPIDEM TARTRATE 5 MG PO TABS: 5.0000 mg | ORAL_TABLET | Freq: Every evening | ORAL | 0 refills | Status: DC | PRN

## 2021-12-27 MED ORDER — DIPHENHYDRAMINE HCL 25 MG PO CAPS
50.0000 mg | ORAL_CAPSULE | Freq: Once | ORAL | Status: AC
  Administered 2021-12-27: 50 mg via ORAL
  Filled 2021-12-27: qty 2

## 2021-12-27 MED ORDER — ACETAMINOPHEN 325 MG PO TABS
650.0000 mg | ORAL_TABLET | Freq: Once | ORAL | Status: AC
  Administered 2021-12-27: 650 mg via ORAL
  Filled 2021-12-27: qty 2

## 2021-12-27 MED ORDER — BORTEZOMIB CHEMO SQ INJECTION 3.5 MG (2.5MG/ML)
1.3000 mg/m2 | Freq: Once | INTRAMUSCULAR | Status: AC
  Administered 2021-12-27: 2.5 mg via SUBCUTANEOUS
  Filled 2021-12-27: qty 1

## 2021-12-27 NOTE — Progress Notes (Signed)
This RN observed Pt take prescribed PO dose of Cytoxan. Pt took correct dose of 9 pills (450 mg) at 12:55 today while in infusion.

## 2021-12-27 NOTE — Patient Instructions (Signed)
[facility]COL[facility]  Discharge Instructions: Thank you for choosing [facility] to provide your oncology and hematology care.   If you have a lab appointment with the [facility], please go directly to the [facility] and check in at the registration area.   Wear comfortable clothing and clothing appropriate for easy access to any Portacath or PICC line.   We strive to give you quality time with your provider. You may need to reschedule your appointment if you arrive late (15 or more minutes).  Arriving late affects you and other patients whose appointments are after yours.  Also, if you miss three or more appointments without notifying the office, you may be dismissed from the clinic at the providers discretion.      For prescription refill requests, have your pharmacy contact our office and allow 72 hours for refills to be completed.    Today you received the following chemotherapy and/or immunotherapy agents: Velcade & Darzalex Faspro   To help prevent nausea and vomiting after your treatment, we encourage you to take your nausea medication as directed.  BELOW ARE SYMPTOMS THAT SHOULD BE REPORTED IMMEDIATELY: *FEVER GREATER THAN 100.4 F (38 C) OR HIGHER *CHILLS OR SWEATING *NAUSEA AND VOMITING THAT IS NOT CONTROLLED WITH YOUR NAUSEA MEDICATION *UNUSUAL SHORTNESS OF BREATH *UNUSUAL BRUISING OR BLEEDING *URINARY PROBLEMS (pain or burning when urinating, or frequent urination) *BOWEL PROBLEMS (unusual diarrhea, constipation, pain near the anus) TENDERNESS IN MOUTH AND THROAT WITH OR WITHOUT PRESENCE OF ULCERS (sore throat, sores in mouth, or a toothache) UNUSUAL RASH, SWELLING OR PAIN  UNUSUAL VAGINAL DISCHARGE OR ITCHING   Items with * indicate a potential emergency and should be followed up as soon as possible or go to the Emergency Department if any problems should occur.  Please show the CHEMOTHERAPY ALERT CARD or IMMUNOTHERAPY ALERT CARD at  check-in to the Emergency Department and triage nurse.  Should you have questions after your visit or need to cancel or reschedule your appointment, please contact [facility]  Dept: [phone]  and follow the prompts.  Office hours are 8:00 a.m. to 4:30 p.m. Monday - Friday. Please note that voicemails left after 4:00 p.m. may not be returned until the following business day.  We are closed weekends and major holidays. You have access to a nurse at all times for urgent questions. Please call the main number to the clinic Dept: [phone] and follow the prompts.   For any non-urgent questions, you may also contact your provider using MyChart. We now offer e-Visits for anyone 28 and older to request care online for non-urgent symptoms. For details visit [email or web address].   Also download the MyChart app! Go to the app store, search "MyChart", open the app, select [facility], and log in with your MyChart username and password.  Due to Covid, a mask is required upon entering the hospital/clinic. If you do not have a mask, one will be given to you upon arrival. For doctor visits, patients may have 1 support person aged 48 or older with them. For treatment visits, patients cannot have anyone with them due to current Covid guidelines and our immunocompromised population.

## 2021-12-27 NOTE — Progress Notes (Signed)
[place]   [phone] [phone]   Clinic Follow up Note   Patient Care Team: Pcp, No as PCP - General Buntin, Lavonda Jumbo, RN as Registered Nurse Pickenpack-Cousar, Carlena Sax, NP as Nurse Practitioner (Nurse Practitioner)  Date of Service:  12/27/2021  CHIEF COMPLAINT: f/u of Caleb amyloidosis with multiple organs involvement  CURRENT THERAPY:  Weekly Dexa, Velcade and Cytoxan, started 09/10/21 -daratumumab injection added 09/27/21  ASSESSMENT & PLAN:  Caleb Simmons is a 71 y.o. male with   1. Caleb amyloidosis with renal, cardiac and neuro involvement  -initially presented with weakness and SOB; lab work up showed BUN of 44, Cr 6.5, significant proteinuria/RBC on UA. Right renal biopsy on 08/28/21 revealing Caleb amyloidosis, lambda light chain composition. 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 Both serum and urine lamda Free light chains significantly elevated. -He started hemodialysis in the hospital, and currently on Monday Wednesday Friday schedule. -Due to the multiorgan involvement, with significant CHF (EF 45-50%) and renal failure, his prognosis is very poor. -He began weekly oral Cytoxan, dexa and Velcade injection while inpatient on 09/10/21.  He is tolerating well so far, plan to continue for 6 months -He began daratumumab injection 09/27/21. -his light chain levels showed improvement on repeat labs on 10/04/21. (Last two labs were not able to be processed due to being grossly lipemic) -he is tolerating chemo well, dexa decreased from 84m to 247mweekly due to insomnia  -not a candidate for biphosphonate due to renal failure -he continues to tolerate treatment well overall.   2. Symptom management: Nausea, Insomnia, Low appetite, constipation, anxiety -zofran helps his nausea -he was prescribed remeron and ambien on 10/18/21 by NP NiLexine BatonThey do not feel the remeron is helping. She increased his dose to 30 mg today  (12/27/21) -he is sleeping better with Ambien and Xanax   3. Orthostatic Hypotension and CHF  -Likely secondary to amyloidosis with neuro involvement  -He had a significant symptomatic orthostasis, SBP drops to 70's when he stands up -he met with Dr. McAundra Dubinn 10/23/21 and was started on midodrine.   4. 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 ESRD on HD, secondary to #1 -he is on dialysis Mon, Wed, Fri. -I spoke with his nephrologist to see if he can do less dialysis since his amyloidosis has improved. At this time, it's not recommended, but maybe at a later time. -creatinine has been stable in 3-range.     PLAN: -Proceed with Cycle 4 day 1 CyBorD and Dara injection today and continue weekly -lab and Velcade/Dara injections weekly x8 -f/u in 3 and 6 weeks  -they prefer morning appointments if possible   No problem-specific Assessment & Plan notes found for this encounter.   INTERVAL HISTORY:  AlFRANKLIN BAUMBACHs here for a follow up of Caleb amyloidosis. He was last seen by me on 12/06/21. He presents to the clinic accompanied by his wife. He notes he enjoyed his holiday break. He reports continued fatigue from dialysis. He notes his appetite improved during his break.   All other systems were reviewed with the patient and are negative.  MEDICAL HISTORY:  History reviewed. No pertinent past medical history.  SURGICAL HISTORY: Past Surgical History:  Procedure Laterality Date   APPENDECTOMY  IR EMBO ART  VEN HEMORR LYMPH EXTRAV  INC GUIDE ROADMAPPING  08/30/2021   IR FLUORO GUIDE CV LINE RIGHT  08/30/2021   IR FLUORO GUIDE CV LINE RIGHT  09/18/2021   IR RENAL SELECTIVE  UNI INC S&I MOD SED  08/31/2021   IR US GUIDE VASC ACCESS RIGHT  08/30/2021   IR US GUIDE VASC ACCESS RIGHT  08/30/2021    IR US GUIDE VASC ACCESS RIGHT  09/18/2021    I have reviewed the social history and family history with the patient and they are unchanged from previous note.  ALLERGIES:  has No Known Allergies.  MEDICATIONS:  Current Outpatient Medications  Medication Sig Dispense Refill   acetaminophen (TYLENOL) 325 MG tablet Take 2 tablets (650 mg total) by mouth every 6 (six) hours as needed for moderate pain (headache).     ALPRAZolam (XANAX) 0.5 MG tablet TAKE 1 TABLET BY MOUTH 2 TIMES DAILY AS NEEDED FOR SLEEP OR ANXIETY 60 tablet 0   amoxicillin-clavulanate (AUGMENTIN) 875-125 MG tablet Take 1 tablet by mouth 2 (two) times daily. (Patient not taking: Reported on 12/27/2021) 14 tablet 0   cyclophosphamide (CYTOXAN) 50 MG capsule Take 9 capsules (450 mg total) by mouth once a week. Take with food to minimize GI upset. Take early in the day and maintain hydration. 36 capsule 1   folic acid (FOLVITE) 1 MG tablet TAKE 2 TABLETS BY MOUTH EVERY DAY 60 tablet 0   midodrine (PROAMATINE) 5 MG tablet Take 1 tablet (5 mg total) by mouth 3 (three) times daily with meals. 270 tablet 3   mirtazapine (REMERON) 30 MG tablet Take 1 tablet (30 mg total) by mouth at bedtime. 30 tablet 1   ondansetron (ZOFRAN) 4 MG tablet Take 1 tablet (4 mg total) by mouth every 6 (six) hours as needed for nausea or vomiting. 30 tablet 2   oxyCODONE (OXY IR/ROXICODONE) 5 MG immediate release tablet Take 1 tablet (5 mg total) by mouth every 6 (six) hours as needed for severe pain. 45 tablet 0   pantoprazole (PROTONIX) 40 MG tablet TAKE 1 TABLET BY MOUTH 2 TIMES DAILY BEFORE A meal 60 tablet 0   prochlorperazine (COMPAZINE) 5 MG tablet Take 1 tablet (5 mg total) by mouth every 6 (six) hours as needed for nausea or vomiting. 30 tablet 2   tamsulosin (FLOMAX) 0.4 MG CAPS capsule TAKE 1 CAPSULE BY MOUTH EVERY DAY 30 capsule 0   zolpidem (AMBIEN) 5 MG tablet Take 1 tablet (5 mg total) by mouth at bedtime as needed for sleep. 30 tablet 0   No  current facility-administered medications for this visit.   Facility-Administered Medications Ordered in Other Visits  Medication Dose Route Frequency Provider Last Rate Last Admin   heparin lock flush 100 unit/mL  500 Units Intracatheter Once Truitt Merle, MD       sodium chloride flush (NS) 0.9 % injection 10 mL  10 mL Intracatheter Once Truitt Merle, MD        PHYSICAL EXAMINATION: ECOG PERFORMANCE STATUS: 3 - Symptomatic, >50% confined to bed  There were no vitals filed for this visit. Wt Readings from Last 3 Encounters:  12/27/21 170 lb (77.1 kg)  12/06/21 168 lb 8 oz (76.4 kg)  11/29/21 162 lb 8 oz (73.7 kg)     GENERAL:alert, no distress and comfortable SKIN: skin color normal, no rashes or significant lesions EYES: normal, Conjunctiva are pink and non-injected, sclera clear  NEURO: alert & oriented x 3 with fluent speech  LABORATORY DATA:  I have reviewed the data as listed CBC Latest Ref Rng & Units 12/27/2021 12/06/2021 11/29/2021  WBC 4.0 - 10.5 K/uL 9.0 7.3 8.1  Hemoglobin 13.0 - 17.0 g/dL 9.8(L) 10.2(L) 11.6(L)  Hematocrit 39.0 - 52.0 % 30.0(L) 31.1(L) 35.0(L)  Platelets 150 - 400 K/uL 215 126(L) 171     CMP Latest Ref Rng & Units 12/27/2021 12/06/2021 11/29/2021  Glucose 70 - 99 mg/dL 97 118(H) 109(H)  BUN 8 - 23 mg/dL 29(H) 22 18  Creatinine 0.61 - 1.24 mg/dL 3.86(HH) 3.69(HH) 3.19(HH)  Sodium 135 - 145 mmol/L 137 140 142  Potassium 3.5 - 5.1 mmol/L 4.6 4.0 3.7  Chloride 98 - 111 mmol/L 104 105 105  CO2 22 - 32 mmol/L 26 25 25   Calcium 8.9 - 10.3 mg/dL 8.7(L) 8.3(L) 8.2(L)  Total Protein 6.5 - 8.1 g/dL 5.9(L) 5.5(L) 5.9(L)  Total Bilirubin 0.3 - 1.2 mg/dL 0.1(L) 0.3 0.2(L)  Alkaline Phos 38 - 126 U/L 93 83 103  AST 15 - 41 U/L 9(L) 8(L) 10(L)  ALT 0 - 44 U/L 6 6 9       RADIOGRAPHIC STUDIES: I have personally reviewed the radiological images as listed and agreed with the findings in the report. No results found.    No orders of the defined types were placed  in this encounter.  All questions were answered. The patient knows to call the clinic with any problems, questions or concerns. No barriers to learning was detected.      Truitt Merle, MD 12/27/2021   I, Wilburn Mylar, am acting as scribe for Truitt Merle, MD.   I have reviewed the above documentation for accuracy and completeness, and I agree with the above.

## 2021-12-27 NOTE — Telephone Encounter (Signed)
CRITICAL VALUE STICKER  CRITICAL VALUE: Creatinine = 3.86  RECEIVER (on-site recipient of call): Yetta Glassman, CMA  DATE & TIME NOTIFIED: 12/27/21 at 11:00am  MESSENGER (representative from lab): Pam  MD NOTIFIED: FENG  TIME OF NOTIFICATION: 12/27/21 at 11:02am  RESPONSE: Notification given to Tammi Sou, RN for follow-up with provider and pt.

## 2021-12-27 NOTE — Progress Notes (Signed)
[place]  Telephone:(336) [phone] Fax:(336) [phone]   Name: Caleb Simmons Date: 12/27/2021 MRN: [id number]  DOB: [birth year]  Patient Care Team: Pcp, No as PCP - General Buntin, Lavonda Jumbo, RN as Registered Nurse Pickenpack-Cousar, Carlena Sax, NP as Nurse Practitioner (Nurse Practitioner)    INTERVAL HISTORY: Caleb Simmons is a 71 y.o. 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  Palliative visit post-hospital follow-up for ongoing goals of care discussions and symptom management.   SOCIAL HISTORY:     reports that he has been smoking cigarettes. He has a 6.25 pack-year smoking history. He has never used smokeless tobacco.  ADVANCE DIRECTIVES:  Patient reports completed document.  Son Caleb, Simmons. is his healthcare power of attorney.  CODE STATUS: Full code  PAST MEDICAL HISTORY:No past medical history on file.   HEMATOLOGY/ONCOLOGY HISTORY:  Oncology History   No history exists.    ALLERGIES:  has No Known Allergies.  MEDICATIONS:  Current Outpatient Medications  Medication Sig Dispense Refill   acetaminophen (TYLENOL) 325 MG tablet Take 2 tablets (650 mg total) by mouth every 6 (six) hours as needed for moderate pain (headache).     ALPRAZolam (XANAX) 0.5 MG tablet TAKE 1 TABLET BY MOUTH 2 TIMES DAILY AS NEEDED FOR SLEEP OR ANXIETY 60 tablet 0   amoxicillin-clavulanate (AUGMENTIN) 875-125 MG tablet Take 1 tablet by mouth 2 (two) times daily. (Patient not taking: Reported on 12/27/2021) 14 tablet 0   cyclophosphamide (CYTOXAN) 50 MG capsule Take 9 capsules (450 mg total) by mouth once a week. Take with food to minimize GI upset. Take early in the day and maintain hydration. 36 capsule 1   folic acid (FOLVITE) 1 MG tablet TAKE 2 TABLETS BY MOUTH EVERY DAY 60 tablet 0   midodrine (PROAMATINE) 5 MG tablet Take 1  tablet (5 mg total) by mouth 3 (three) times daily with meals. 270 tablet 3   mirtazapine (REMERON) 30 MG tablet Take 1 tablet (30 mg total) by mouth at bedtime. 30 tablet 1   ondansetron (ZOFRAN) 4 MG tablet Take 1 tablet (4 mg total) by mouth every 6 (six) hours as needed for nausea or vomiting. 30 tablet 2   oxyCODONE (OXY IR/ROXICODONE) 5 MG immediate release tablet Take 1 tablet (5 mg total) by mouth every 6 (six) hours as needed for severe pain. 45 tablet 0   pantoprazole (PROTONIX) 40 MG tablet TAKE 1 TABLET BY MOUTH 2 TIMES DAILY BEFORE A meal 60 tablet 0   prochlorperazine (COMPAZINE) 5 MG tablet Take 1 tablet (5 mg total) by mouth every 6 (six) hours as needed for nausea or vomiting. 30 tablet 2   tamsulosin (FLOMAX) 0.4 MG CAPS capsule TAKE 1 CAPSULE BY MOUTH EVERY DAY 30 capsule 0   zolpidem (AMBIEN) 5 MG tablet Take 1 tablet (5 mg total) by mouth at bedtime as needed for sleep. 30 tablet 0   No current facility-administered medications for this visit.   Facility-Administered Medications Ordered in Other Visits  Medication Dose Route Frequency Provider Last Rate Last Admin   heparin lock flush 100 unit/mL  500 Units Intracatheter Once Truitt Merle, MD       sodium chloride flush (NS) 0.9 % injection 10 mL  10 mL Intracatheter Once Truitt Merle, MD        VITAL SIGNS: BP 105/72 (BP Location: Left Arm, Patient Position:  Sitting)    Pulse (!) 112    Temp 98.8 F (37.1 C) (Oral)    Resp 18    Ht 5\' 10"  (1.778 m)    Wt 170 lb (77.1 kg)    SpO2 97%    BMI 24.39 kg/m  Filed Weights   12/27/21 0938  Weight: 170 lb (77.1 kg)    Estimated body mass index is 24.39 kg/m as calculated from the following:   Height as of this encounter: 5\' 10"  (1.778 m).   Weight as of this encounter: 170 lb (77.1 kg).  LABS: CBC:    Component Value Date/Time   WBC 9.0 12/27/2021 1009   HGB 9.8 (L) 12/27/2021 1009   HGB 9.1 (L) 09/27/2021 0733   HCT 30.0 (L) 12/27/2021 1009   PLT 215 12/27/2021 1009    PLT 223 09/27/2021 0733   MCV 90.1 12/27/2021 1009   NEUTROABS 6.4 12/27/2021 1009   LYMPHSABS 1.5 12/27/2021 1009   MONOABS 0.8 12/27/2021 1009   EOSABS 0.2 12/27/2021 1009   BASOSABS 0.1 12/27/2021 1009   Comprehensive Metabolic Panel:    Component Value Date/Time   NA 140 12/06/2021 0752   K 4.0 12/06/2021 0752   CL 105 12/06/2021 0752   CO2 25 12/06/2021 0752   BUN 22 12/06/2021 0752   CREATININE 3.69 (HH) 12/06/2021 0752   CREATININE 3.38 (HH) 10/04/2021 1201   GLUCOSE 118 (H) 12/06/2021 0752   CALCIUM 8.3 (L) 12/06/2021 0752   AST 8 (L) 12/06/2021 0752   AST 10 (L) 10/04/2021 1201   ALT 6 12/06/2021 0752   ALT 8 10/04/2021 1201   ALKPHOS 83 12/06/2021 0752   BILITOT 0.3 12/06/2021 0752   BILITOT 0.4 10/04/2021 1201   PROT 5.5 (L) 12/06/2021 0752   ALBUMIN 2.6 (L) 12/06/2021 0752     PERFORMANCE STATUS (ECOG) : 2 - Symptomatic, <50% confined to bed   Physical Exam General: NAD, in wheelchair  Cardiovascular: regular rate and rhythm Pulmonary: clear ant fields Abdomen: soft, nontender, + bowel sounds Extremities: no edema, no joint deformities Skin: no rashes Neurological: AAO x3  IMPRESSION:  [name] is here for follow-up today with support of his friend, Caleb Simmons. No acute distress noted. Denies shortness of breath. Reports pain is well controlled. Appetite is improving as well as his insomnia. He is scheduled for treatment and follow-up with Dr. Burr Medico today. He shares he was able to go with his son and grandkids to a local hockey game which he enjoyed. He expressed this is the first social outing he has been on since he diagnosis and meant so much to him that he could attend comfortably. Shares he was able to eat a hot dog, large popcorn, and drink and forgot how good these foods were.   Pain Caleb Simmons reports pain is well controlled on current regimen (Oxy IR 5mg ). He is tolerating medications and when he does have pain it is appropriately resolved with medication.    Decreased Appetite Tolerating Remeron 30 mg. He and Caleb Simmons feels as though his appetite continues to improve and would like to continue on 30 mg. Weight on 12/15 was 168lb current weight today is 170lb.   Constipation Is actively taking Miralax daily for bowel regimen. Denies any concerns with constipation but can tell a difference when missing a dose.   Insomnia Caleb Simmons shares he is sleeping much better at night with limited episodes of waking up as he previously had. He would is appreciative of improvement with ambien.  I discussed the importance of continued conversation with family and their medical providers regarding overall plan of care and treatment options, ensuring decisions are within the context of the patients values and GOCs.  PLAN: Continue Miralax daily for bowel regimen Continue Oxycodone as needed for pain Ambien at bedtime  Appetite continues to improve. Continue mirtazapine 30 mg.  I will plan to see him back in 3 weeks in collaboration with Oncology team.    Patient expressed understanding and was in agreement with this plan. He also understands that He can call the clinic at any time with any questions, concerns, or complaints.   Time Total: 25 min.   Visit consisted of counseling and education dealing with the complex and emotionally intense issues of symptom management and palliative care in the setting of serious and potentially life-threatening illness.Greater than 50%  of this time was spent counseling and coordinating care related to the above assessment and plan.  Signed by: Alda Lea, AGPCNP-BC Palliative Medicine Team

## 2021-12-28 ENCOUNTER — Encounter: Payer: Self-pay | Admitting: Hematology

## 2021-12-28 LAB — KAPPA/LAMBDA LIGHT CHAINS
Kappa free light chain: 43.5 mg/L — ABNORMAL HIGH (ref 3.3–19.4)
Kappa, lambda light chain ratio: 1.2 (ref 0.26–1.65)
Lambda free light chains: 36.3 mg/L — ABNORMAL HIGH (ref 5.7–26.3)

## 2021-12-28 NOTE — Addendum Note (Signed)
Addended by: Truitt Merle on: 12/28/2021 08:38 AM   Modules accepted: Orders

## 2021-12-31 LAB — MULTIPLE MYELOMA PANEL, SERUM
Albumin SerPl Elph-Mcnc: 2.7 g/dL — ABNORMAL LOW (ref 2.9–4.4)
Albumin/Glob SerPl: 1.2 (ref 0.7–1.7)
Alpha 1: 0.3 g/dL (ref 0.0–0.4)
Alpha2 Glob SerPl Elph-Mcnc: 1 g/dL (ref 0.4–1.0)
B-Globulin SerPl Elph-Mcnc: 0.8 g/dL (ref 0.7–1.3)
Gamma Glob SerPl Elph-Mcnc: 0.3 g/dL — ABNORMAL LOW (ref 0.4–1.8)
Globulin, Total: 2.4 g/dL (ref 2.2–3.9)
IgA: 39 mg/dL — ABNORMAL LOW (ref 61–437)
IgG (Immunoglobin G), Serum: 287 mg/dL — ABNORMAL LOW (ref 603–1613)
IgM (Immunoglobulin M), Srm: 60 mg/dL (ref 15–143)
M Protein SerPl Elph-Mcnc: 0.1 g/dL — ABNORMAL HIGH
Total Protein ELP: 5.1 g/dL — ABNORMAL LOW (ref 6.0–8.5)

## 2022-01-03 ENCOUNTER — Other Ambulatory Visit: Payer: Self-pay

## 2022-01-03 ENCOUNTER — Other Ambulatory Visit: Payer: Commercial Managed Care - PPO

## 2022-01-03 ENCOUNTER — Inpatient Hospital Stay: Payer: Commercial Managed Care - PPO

## 2022-01-03 ENCOUNTER — Other Ambulatory Visit (HOSPITAL_COMMUNITY): Payer: Self-pay

## 2022-01-03 ENCOUNTER — Other Ambulatory Visit: Payer: Self-pay | Admitting: Hematology

## 2022-01-03 ENCOUNTER — Ambulatory Visit: Payer: Commercial Managed Care - PPO

## 2022-01-03 VITALS — BP 110/75 | HR 100 | Resp 20 | Ht 70.0 in | Wt 170.2 lb

## 2022-01-03 DIAGNOSIS — Z5112 Encounter for antineoplastic immunotherapy: Secondary | ICD-10-CM | POA: Diagnosis not present

## 2022-01-03 DIAGNOSIS — E8581 Light chain (AL) amyloidosis: Secondary | ICD-10-CM

## 2022-01-03 LAB — CBC WITH DIFFERENTIAL/PLATELET
Abs Immature Granulocytes: 0.07 10*3/uL (ref 0.00–0.07)
Basophils Absolute: 0.1 10*3/uL (ref 0.0–0.1)
Basophils Relative: 1 %
Eosinophils Absolute: 0.3 10*3/uL (ref 0.0–0.5)
Eosinophils Relative: 3 %
HCT: 30.9 % — ABNORMAL LOW (ref 39.0–52.0)
Hemoglobin: 10.1 g/dL — ABNORMAL LOW (ref 13.0–17.0)
Immature Granulocytes: 1 %
Lymphocytes Relative: 16 %
Lymphs Abs: 1.9 10*3/uL (ref 0.7–4.0)
MCH: 29.8 pg (ref 26.0–34.0)
MCHC: 32.7 g/dL (ref 30.0–36.0)
MCV: 91.2 fL (ref 80.0–100.0)
Monocytes Absolute: 0.8 10*3/uL (ref 0.1–1.0)
Monocytes Relative: 7 %
Neutro Abs: 8.4 10*3/uL — ABNORMAL HIGH (ref 1.7–7.7)
Neutrophils Relative %: 72 %
Platelets: 167 10*3/uL (ref 150–400)
RBC: 3.39 MIL/uL — ABNORMAL LOW (ref 4.22–5.81)
RDW: 16.9 % — ABNORMAL HIGH (ref 11.5–15.5)
WBC: 11.6 10*3/uL — ABNORMAL HIGH (ref 4.0–10.5)
nRBC: 0 % (ref 0.0–0.2)

## 2022-01-03 LAB — COMPREHENSIVE METABOLIC PANEL
ALT: 6 U/L (ref 0–44)
AST: 8 U/L — ABNORMAL LOW (ref 15–41)
Albumin: 3.3 g/dL — ABNORMAL LOW (ref 3.5–5.0)
Alkaline Phosphatase: 84 U/L (ref 38–126)
Anion gap: 8 (ref 5–15)
BUN: 24 mg/dL — ABNORMAL HIGH (ref 8–23)
CO2: 27 mmol/L (ref 22–32)
Calcium: 8.5 mg/dL — ABNORMAL LOW (ref 8.9–10.3)
Chloride: 104 mmol/L (ref 98–111)
Creatinine, Ser: 3.44 mg/dL (ref 0.61–1.24)
GFR, Estimated: 18 mL/min — ABNORMAL LOW (ref >60)
Glucose, Bld: 116 mg/dL — ABNORMAL HIGH (ref 70–99)
Potassium: 4.3 mmol/L (ref 3.5–5.1)
Sodium: 139 mmol/L (ref 135–145)
Total Bilirubin: 0.2 mg/dL — ABNORMAL LOW (ref 0.3–1.2)
Total Protein: 5.6 g/dL — ABNORMAL LOW (ref 6.5–8.1)

## 2022-01-03 MED ORDER — DEXAMETHASONE 4 MG PO TABS
20.0000 mg | ORAL_TABLET | Freq: Once | ORAL | Status: AC
  Administered 2022-01-03: 20 mg via ORAL
  Filled 2022-01-03: qty 5

## 2022-01-03 MED ORDER — BORTEZOMIB CHEMO SQ INJECTION 3.5 MG (2.5MG/ML)
1.3000 mg/m2 | Freq: Once | INTRAMUSCULAR | Status: AC
  Administered 2022-01-03: 2.5 mg via SUBCUTANEOUS
  Filled 2022-01-03: qty 1

## 2022-01-03 MED ORDER — OXYCODONE HCL 5 MG PO TABS: 5.0000 mg | ORAL_TABLET | Freq: Four times a day (QID) | ORAL | 0 refills | Status: DC | PRN

## 2022-01-03 NOTE — Patient Instructions (Addendum)
[facility] ONCOLOGY  Discharge Instructions: Thank you for choosing [facility] to provide your oncology and hematology care.   If you have a lab appointment with the [facility], please go directly to the [facility] and check in at the registration area.   Wear comfortable clothing and clothing appropriate for easy access to any Portacath or PICC line.   We strive to give you quality time with your provider. You may need to reschedule your appointment if you arrive late (15 or more minutes).  Arriving late affects you and other patients whose appointments are after yours.  Also, if you miss three or more appointments without notifying the office, you may be dismissed from the clinic at the providers discretion.      For prescription refill requests, have your pharmacy contact our office and allow 72 hours for refills to be completed.    Today you received the following chemotherapy and/or immunotherapy agent: Bortezomib (Velcade)   To help prevent nausea and vomiting after your treatment, we encourage you to take your nausea medication as directed.  BELOW ARE SYMPTOMS THAT SHOULD BE REPORTED IMMEDIATELY: *FEVER GREATER THAN 100.4 F (38 C) OR HIGHER *CHILLS OR SWEATING *NAUSEA AND VOMITING THAT IS NOT CONTROLLED WITH YOUR NAUSEA MEDICATION *UNUSUAL SHORTNESS OF BREATH *UNUSUAL BRUISING OR BLEEDING *URINARY PROBLEMS (pain or burning when urinating, or frequent urination) *BOWEL PROBLEMS (unusual diarrhea, constipation, pain near the anus) TENDERNESS IN MOUTH AND THROAT WITH OR WITHOUT PRESENCE OF ULCERS (sore throat, sores in mouth, or a toothache) UNUSUAL RASH, SWELLING OR PAIN  UNUSUAL VAGINAL DISCHARGE OR ITCHING   Items with * indicate a potential emergency and should be followed up as soon as possible or go to the Emergency Department if any problems should occur.  Please show the CHEMOTHERAPY ALERT CARD or IMMUNOTHERAPY ALERT CARD at  check-in to the Emergency Department and triage nurse.  Should you have questions after your visit or need to cancel or reschedule your appointment, please contact [facility]  Dept: [phone]  and follow the prompts.  Office hours are 8:00 a.m. to 4:30 p.m. Monday - Friday. Please note that voicemails left after 4:00 p.m. may not be returned until the following business day.  We are closed weekends and major holidays. You have access to a nurse at all times for urgent questions. Please call the main number to the clinic Dept: [phone] and follow the prompts.   For any non-urgent questions, you may also contact your provider using MyChart. We now offer e-Visits for anyone 72 and older to request care online for non-urgent symptoms. For details visit [email or web address].   Also download the MyChart app! Go to the app store, search "MyChart", open the app, select [facility], and log in with your MyChart username and password.  Due to Covid, a mask is required upon entering the hospital/clinic. If you do not have a mask, one will be given to you upon arrival. For doctor visits, patients may have 1 support person aged 16 or older with them. For treatment visits, patients cannot have anyone with them due to current Covid guidelines and our immunocompromised population.   Cyclophosphamide Tablets or Capsules What is this medication? CYCLOPHOSPHAMIDE (sye kloe FOSS fa mide) is a chemotherapy drug. It slows the growth of cancer cells. This medicine is used to treat many types of cancer like lymphoma, myeloma, leukemia, breast cancer, and ovarian cancer, to name a few. It is also  used to treat nephrotic syndrome in children. This medicine may be used for other purposes; ask your health care provider or pharmacist if you have questions. COMMON BRAND NAME(S): Cytoxan What should I tell my care team before I take this medication? They need to know if you have any of  these conditions: heart disease history of irregular heartbeat infection kidney disease liver disease low blood counts, like white cells, platelets, or red blood cells on hemodialysis recent or ongoing radiation therapy scarring or thickening of the lungs trouble passing urine an unusual or allergic reaction to cyclophosphamide, other medicines, foods, dyes, or preservatives pregnant or trying to get pregnant breast-feeding How should I use this medication? Take this medicine by mouth with a glass of water. Follow the directions on the prescription label. Do not cut, crush or chew this medicine. Take your medicine at regular intervals. Do not take it more often than directed. Do not stop taking except on your doctor's advice. Talk to your pediatrician regarding the use of this medicine in children. Special care may be needed. Overdosage: If you think you have taken too much of this medicine contact a poison control center or emergency room at once. NOTE: This medicine is only for you. Do not share this medicine with others. What if I miss a dose? If you miss a dose, take it as soon as you can. If it is almost time for your next dose, take only that dose. Do not take double or extra doses. What may interact with this medication? amphotericin B azathioprine certain antivirals for HIV or hepatitis certain medicines for blood pressure, heart disease, irregular heart beat certain medicines that treat or prevent blood clots like warfarin certain other medicines for cancer cyclosporine etanercept indomethacin medicines that relax muscles for surgery medicines to increase blood counts metronidazole This list may not describe all possible interactions. Give your health care provider a list of all the medicines, herbs, non-prescription drugs, or dietary supplements you use. Also tell them if you smoke, drink alcohol, or use illegal drugs. Some items may interact with your medicine. What  should I watch for while using this medication? Your condition will be monitored carefully while you are receiving this medicine. You may need blood work done while you are taking this medicine. Drink water or other fluids as directed. Urinate often, even at night. Do not become pregnant while taking this medicine or for 1 year after stopping it. Women should inform their health care professional if they wish to become pregnant or think they might be pregnant. Men should not father a child while taking this medicine and for 4 months after stopping it. There is potential for serious side effects to an unborn child. Talk to your health care professional for more information. Do not breast-feed an infant while taking this medicine or for 1 week after stopping it. This medicine has caused ovarian failure in some women. This medicine may make it more difficult to get pregnant. Talk to your health care professional if you are concerned about your fertility. This medicine has caused decreased sperm counts in some men. This may make it more difficult to father a child. Talk to your health care professional if you are concerned about your fertility. Call your health care professional for advice if you get a fever, chills, or sore throat, or other symptoms of a cold or flu. Do not treat yourself. This medicine decreases your body's ability to fight infections. Try to avoid being around people who  are sick. Avoid taking medicines that contain aspirin, acetaminophen, ibuprofen, naproxen, or ketoprofen unless instructed by your health care professional. These medicines may hide a fever. Talk to your health care professional about your risk of cancer. You may be more at risk for certain types of cancer if you take this medicine. If you are going to need surgery or other procedure, tell your health care professional that you are using this medicine. Be careful brushing or flossing your teeth or using a toothpick  because you may get an infection or bleed more easily. If you have any dental work done, tell your dentist you are receiving this medicine. What side effects may I notice from receiving this medication? Side effects that you should report to your doctor or health care professional as soon as possible: allergic reactions like skin rash, itching or hives, swelling of the face, lips, or tongue breathing problems nausea, vomiting signs and symptoms of bleeding such as bloody or black, tarry stools; red or dark brown urine; spitting up blood or brown material that looks like coffee grounds; red spots on the skin; unusual bruising or bleeding from the eyes, gums, or nose signs and symptoms of heart failure like fast, irregular heartbeat, sudden weight gain; swelling of the ankles, feet, hands signs and symptoms of infection like fever; chills; cough; sore throat; pain or trouble passing urine signs and symptoms of kidney injury like trouble passing urine or change in the amount of urine signs and symptoms of liver injury like dark yellow or brown urine; general ill feeling or flu-like symptoms; light-colored stools; loss of appetite; nausea; right upper belly pain; unusually weak or tired; yellowing of the eyes or skin Side effects that usually do not require medical attention (report to your doctor or health care professional if they continue or are bothersome): confusion decreased hearing diarrhea facial flushing hair loss headache loss of appetite missed menstrual periods signs and symptoms of low red blood cells or anemia such as unusually weak or tired; feeling faint or lightheaded; falls skin discoloration This list may not describe all possible side effects. Call your doctor for medical advice about side effects. You may report side effects to FDA at 1-800-FDA-1088. Where should I keep my medication? Keep out of the reach of children. Store at room temperature between 20 and 25 degrees C  (68 and 77 degrees F). Throw away any unused medicine after the expiration date. NOTE: This sheet is a summary. It may not cover all possible information. If you have questions about this medicine, talk to your doctor, pharmacist, or health care provider.  2022 Elsevier/Gold Standard (2021-08-28 00:00:00)

## 2022-01-03 NOTE — Telephone Encounter (Signed)
Caleb Simmons, Mr. Lydon's roommate, called to refill his Oxycodone. Refill sent to [facility], NP.

## 2022-01-03 NOTE — Progress Notes (Signed)
Pt. given Cytoxan 450 mg (total 9 pills, 50 mg each) as directed. Medication checked by 2 RN's myself and Customer service manager.

## 2022-01-07 ENCOUNTER — Other Ambulatory Visit (HOSPITAL_COMMUNITY): Payer: Self-pay

## 2022-01-09 ENCOUNTER — Other Ambulatory Visit (HOSPITAL_COMMUNITY): Payer: Self-pay

## 2022-01-10 ENCOUNTER — Other Ambulatory Visit: Payer: Commercial Managed Care - PPO

## 2022-01-10 ENCOUNTER — Telehealth: Payer: Self-pay

## 2022-01-10 ENCOUNTER — Inpatient Hospital Stay (HOSPITAL_BASED_OUTPATIENT_CLINIC_OR_DEPARTMENT_OTHER): Payer: Commercial Managed Care - PPO | Admitting: Nurse Practitioner

## 2022-01-10 ENCOUNTER — Inpatient Hospital Stay: Payer: Commercial Managed Care - PPO

## 2022-01-10 ENCOUNTER — Encounter: Payer: Self-pay | Admitting: Nurse Practitioner

## 2022-01-10 ENCOUNTER — Inpatient Hospital Stay: Payer: Commercial Managed Care - PPO | Admitting: Nurse Practitioner

## 2022-01-10 ENCOUNTER — Other Ambulatory Visit: Payer: Self-pay

## 2022-01-10 ENCOUNTER — Ambulatory Visit: Payer: Commercial Managed Care - PPO

## 2022-01-10 ENCOUNTER — Encounter: Payer: Commercial Managed Care - PPO | Admitting: Nurse Practitioner

## 2022-01-10 VITALS — BP 101/65 | HR 109 | Temp 98.6°F | Resp 19 | Ht 70.0 in | Wt 170.2 lb

## 2022-01-10 VITALS — HR 92

## 2022-01-10 DIAGNOSIS — Z515 Encounter for palliative care: Secondary | ICD-10-CM | POA: Diagnosis not present

## 2022-01-10 DIAGNOSIS — K5903 Drug induced constipation: Secondary | ICD-10-CM | POA: Diagnosis not present

## 2022-01-10 DIAGNOSIS — E8581 Light chain (AL) amyloidosis: Secondary | ICD-10-CM

## 2022-01-10 DIAGNOSIS — Z5112 Encounter for antineoplastic immunotherapy: Secondary | ICD-10-CM | POA: Diagnosis not present

## 2022-01-10 DIAGNOSIS — G893 Neoplasm related pain (acute) (chronic): Secondary | ICD-10-CM

## 2022-01-10 DIAGNOSIS — R531 Weakness: Secondary | ICD-10-CM | POA: Diagnosis not present

## 2022-01-10 LAB — COMPREHENSIVE METABOLIC PANEL
ALT: 7 U/L (ref 0–44)
AST: 9 U/L — ABNORMAL LOW (ref 15–41)
Albumin: 3.4 g/dL — ABNORMAL LOW (ref 3.5–5.0)
Alkaline Phosphatase: 95 U/L (ref 38–126)
Anion gap: 9 (ref 5–15)
BUN: 33 mg/dL — ABNORMAL HIGH (ref 8–23)
CO2: 23 mmol/L (ref 22–32)
Calcium: 8.6 mg/dL — ABNORMAL LOW (ref 8.9–10.3)
Chloride: 106 mmol/L (ref 98–111)
Creatinine, Ser: 4.27 mg/dL (ref 0.61–1.24)
GFR, Estimated: 14 mL/min — ABNORMAL LOW (ref >60)
Glucose, Bld: 125 mg/dL — ABNORMAL HIGH (ref 70–99)
Potassium: 4.6 mmol/L (ref 3.5–5.1)
Sodium: 138 mmol/L (ref 135–145)
Total Bilirubin: 0.3 mg/dL (ref 0.3–1.2)
Total Protein: 5.7 g/dL — ABNORMAL LOW (ref 6.5–8.1)

## 2022-01-10 LAB — CBC WITH DIFFERENTIAL/PLATELET
Abs Immature Granulocytes: 0.04 10*3/uL (ref 0.00–0.07)
Basophils Absolute: 0.1 10*3/uL (ref 0.0–0.1)
Basophils Relative: 1 %
Eosinophils Absolute: 0.2 10*3/uL (ref 0.0–0.5)
Eosinophils Relative: 2 %
HCT: 28.1 % — ABNORMAL LOW (ref 39.0–52.0)
Hemoglobin: 9.1 g/dL — ABNORMAL LOW (ref 13.0–17.0)
Immature Granulocytes: 0 %
Lymphocytes Relative: 14 %
Lymphs Abs: 1.6 10*3/uL (ref 0.7–4.0)
MCH: 29.4 pg (ref 26.0–34.0)
MCHC: 32.4 g/dL (ref 30.0–36.0)
MCV: 90.9 fL (ref 80.0–100.0)
Monocytes Absolute: 0.8 10*3/uL (ref 0.1–1.0)
Monocytes Relative: 7 %
Neutro Abs: 8.7 10*3/uL — ABNORMAL HIGH (ref 1.7–7.7)
Neutrophils Relative %: 76 %
Platelets: 167 10*3/uL (ref 150–400)
RBC: 3.09 MIL/uL — ABNORMAL LOW (ref 4.22–5.81)
RDW: 17.5 % — ABNORMAL HIGH (ref 11.5–15.5)
WBC: 11.4 10*3/uL — ABNORMAL HIGH (ref 4.0–10.5)
nRBC: 0 % (ref 0.0–0.2)

## 2022-01-10 MED ORDER — DIPHENHYDRAMINE HCL 25 MG PO CAPS
50.0000 mg | ORAL_CAPSULE | Freq: Once | ORAL | Status: AC
  Administered 2022-01-10: 50 mg via ORAL
  Filled 2022-01-10: qty 2

## 2022-01-10 MED ORDER — BORTEZOMIB CHEMO SQ INJECTION 3.5 MG (2.5MG/ML)
1.3000 mg/m2 | Freq: Once | INTRAMUSCULAR | Status: AC
  Administered 2022-01-10: 2.5 mg via SUBCUTANEOUS
  Filled 2022-01-10: qty 1

## 2022-01-10 MED ORDER — DEXAMETHASONE 4 MG PO TABS
20.0000 mg | ORAL_TABLET | Freq: Once | ORAL | Status: AC
  Administered 2022-01-10: 20 mg via ORAL
  Filled 2022-01-10: qty 5

## 2022-01-10 MED ORDER — DARATUMUMAB-HYALURONIDASE-FIHJ 1800-30000 MG-UT/15ML ~~LOC~~ SOLN
1800.0000 mg | Freq: Once | SUBCUTANEOUS | Status: AC
  Administered 2022-01-10: 1800 mg via SUBCUTANEOUS
  Filled 2022-01-10: qty 15

## 2022-01-10 MED ORDER — ACETAMINOPHEN 325 MG PO TABS
650.0000 mg | ORAL_TABLET | Freq: Once | ORAL | Status: AC
  Administered 2022-01-10: 650 mg via ORAL
  Filled 2022-01-10: qty 2

## 2022-01-10 NOTE — Progress Notes (Signed)
Pt given 9 pills (450 mg) as prescribed of PO Cytoxan at 11:14 today while in infusion. Medication verified by 2 RN's myself and Leonides Sake, Therapist, sports.

## 2022-01-10 NOTE — Progress Notes (Signed)
[facility]  [phone] [phone]   Name: Caleb Simmons Date: 01/10/2022 MRN: [id number]  DOB: July 18, 1951  Patient Care Team: Pcp, No as PCP - General Buntin, Lavonda Jumbo, RN as Registered Nurse Pickenpack-Cousar, Carlena Sax, NP as Nurse Practitioner (Nurse Practitioner)    INTERVAL HISTORY: Caleb Simmons is a 71 y.o. 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  Palliative visit post-hospital follow-up for ongoing goals of care discussions and symptom management.   SOCIAL HISTORY:     reports that he has been smoking cigarettes. He has a 6.25 pack-year smoking history. He has never used smokeless tobacco.  ADVANCE DIRECTIVES:  Patient reports completed document.  Son [name], [name]. is his healthcare power of attorney.  CODE STATUS: Full code  PAST MEDICAL HISTORY:History reviewed. No pertinent past medical history.  ALLERGIES:  has No Known Allergies.  MEDICATIONS:  Current Outpatient Medications  Medication Sig Dispense Refill   acetaminophen (TYLENOL) 325 MG tablet Take 2 tablets (650 mg total) by mouth every 6 (six) hours as needed for moderate pain (headache).     ALPRAZolam (XANAX) 0.5 MG tablet TAKE 1 TABLET BY MOUTH 2 TIMES DAILY AS NEEDED FOR SLEEP OR ANXIETY 60 tablet 0   amoxicillin-clavulanate (AUGMENTIN) 875-125 MG tablet Take 1 tablet by mouth 2 (two) times daily. (Patient not taking: Reported on 12/27/2021) 14 tablet 0   cyclophosphamide (CYTOXAN) 50 MG capsule Take 9 capsules (450 mg total) by mouth once a week. Take with food to minimize GI upset. Take early in the day and maintain hydration. 36 capsule 1   folic acid (FOLVITE) 1 MG tablet TAKE 2 TABLETS BY MOUTH EVERY DAY 60 tablet 0   midodrine (PROAMATINE) 5 MG tablet Take 1 tablet (5 mg total) by mouth 3 (three) times daily with  meals. 270 tablet 3   mirtazapine (REMERON) 30 MG tablet Take 1 tablet (30 mg total) by mouth at bedtime. 30 tablet 1   ondansetron (ZOFRAN) 4 MG tablet Take 1 tablet (4 mg total) by mouth every 6 (six) hours as needed for nausea or vomiting. 30 tablet 2   oxyCODONE (OXY IR/ROXICODONE) 5 MG immediate release tablet Take 1 tablet (5 mg total) by mouth every 6 (six) hours as needed for severe pain. 60 tablet 0   pantoprazole (PROTONIX) 40 MG tablet TAKE 1 TABLET BY MOUTH 2 TIMES DAILY BEFORE A meal 60 tablet 0   prochlorperazine (COMPAZINE) 5 MG tablet Take 1 tablet (5 mg total) by mouth every 6 (six) hours as needed for nausea or vomiting. 30 tablet 2   tamsulosin (FLOMAX) 0.4 MG CAPS capsule TAKE 1 CAPSULE BY MOUTH EVERY DAY 30 capsule 0   zolpidem (AMBIEN) 5 MG tablet Take 1 tablet (5 mg total) by mouth at bedtime as needed for sleep. 30 tablet 0   No current facility-administered medications for this visit.   Facility-Administered Medications Ordered in Other Visits  Medication Dose Route Frequency Provider Last Rate Last Admin   heparin lock flush 100 unit/mL  500 Units Intracatheter Once Truitt Merle, MD       sodium chloride flush (NS) 0.9 % injection 10 mL  10 mL Intracatheter Once Truitt Merle, MD        VITAL SIGNS: BP 101/65 (BP Location: Right Arm, Patient Position: Sitting)    Pulse (!) 109    Temp 98.6  F (37 C) (Oral)    Resp 19    Ht 5\' 10"  (1.778 m)    Wt 170 lb 3.2 oz (77.2 kg)    SpO2 98%    BMI 24.42 kg/m  Filed Weights   01/10/22 0828  Weight: 170 lb 3.2 oz (77.2 kg)    Estimated body mass index is 24.42 kg/m as calculated from the following:   Height as of this encounter: 5\' 10"  (1.778 m).   Weight as of this encounter: 170 lb 3.2 oz (77.2 kg).   PERFORMANCE STATUS (ECOG) : 2 - Symptomatic, <50% confined to bed   Physical Exam General: NAD, in wheelchair Cardiovascular: Tachycardic Pulmonary: clear ant fields Abdomen: soft, nontender, + bowel sounds Extremities:  no edema, no joint deformities Neurological: Weakness but otherwise nonfocal  IMPRESSION:  [name] presents to the clinic today for follow-up.  His friend [name] is accommodating him.  No acute distress noted.  He is scheduled for treatment on today.  His creatinine is elevated to 4.27 compared to 3.44 on 1/12.  Okay to treat provided by Dr. Burr Medico. He shares his dialysis treatment was shortened due to clotting which could be contributing to his numbers.  He shares his frustration with the nephrologist requesting to have a fistula placed and remove his PermCath.  He does not wish to have this done given he was told that his current catheter could last 6-12 months.  He states "I may not even be here that long" and would not want to have things replaced at this time.  Pain [facility] reports pain is well controlled on current regimen (Oxy IR 5mg ). He is tolerating medications and when he does have pain it is appropriately resolved with medication. States he is having some sciatica pain which is resolved with pharmacological and nonpharmacological interventions.   Decreased Appetite Tolerating Remeron 30 mg. He and [name] feels as though his appetite continues to improve.  Reports able to eat some food that he has not eaten in a while.  We will continue on Remeron 30 mg.  Weight is holding steady at 170 pounds compared to previous weight of 168 on 12/15.    Constipation Is actively taking Miralax daily for bowel regimen. Denies any concerns with constipation but can tell a difference when missing a dose.    Insomnia Continues to have improvement in sleeping at night.  Expresses his appreciation of his ability to remain sleep without interruption and if he does wake up is easily able to fall back asleep.  We will continue on Ambien as prescribed.  PLAN: Reports pain is well controlled on current regimen.  No changes or refills needed All symptom management needs are stable with no adjustments required.   I  will plan to see him back in 2 days 3 weeks in collaboration with his other oncology appointments.   Patient expressed understanding and was in agreement with this plan. He also understands that He can call the clinic at any time with any questions, concerns, or complaints.    Time Total: 30 min  Visit consisted of counseling and education dealing with the complex and emotionally intense issues of symptom management and palliative care in the setting of serious and potentially life-threatening illness.Greater than 50%  of this time was spent counseling and coordinating care related to the above assessment and plan.  Signed by: Alda Lea, AGPCNP-BC [facility]

## 2022-01-10 NOTE — Progress Notes (Signed)
Per MD OK to trt w/ elevated SCR today

## 2022-01-10 NOTE — Telephone Encounter (Signed)
CRITICAL VALUE STICKER  CRITICAL VALUE: Creatinine: 4.27   RECEIVER (on-site recipient of call): Mena Goes, RN   [place] NOTIFIED: 1/19 @ 0850  MESSENGER (representative from lab): Dorian Furnace  MD NOTIFIED: Dr. Burr Medico  TIME OF NOTIFICATION: 1/19 @ 6742  RESPONSE:  Awaiting response - patient on HD.

## 2022-01-10 NOTE — Patient Instructions (Signed)
[facility] ONCOLOGY  Discharge Instructions: Thank you for choosing [facility] to provide your oncology and hematology care.   If you have a lab appointment with the [facility], please go directly to the [facility] and check in at the registration area.   Wear comfortable clothing and clothing appropriate for easy access to any Portacath or PICC line.   We strive to give you quality time with your provider. You may need to reschedule your appointment if you arrive late (15 or more minutes).  Arriving late affects you and other patients whose appointments are after yours.  Also, if you miss three or more appointments without notifying the office, you may be dismissed from the clinic at the providers discretion.      For prescription refill requests, have your pharmacy contact our office and allow 72 hours for refills to be completed.    Today you received the following chemotherapy and/or immunotherapy agents: Velcade & Darzalex Faspro   To help prevent nausea and vomiting after your treatment, we encourage you to take your nausea medication as directed.  BELOW ARE SYMPTOMS THAT SHOULD BE REPORTED IMMEDIATELY: *FEVER GREATER THAN 100.4 F (38 C) OR HIGHER *CHILLS OR SWEATING *NAUSEA AND VOMITING THAT IS NOT CONTROLLED WITH YOUR NAUSEA MEDICATION *UNUSUAL SHORTNESS OF BREATH *UNUSUAL BRUISING OR BLEEDING *URINARY PROBLEMS (pain or burning when urinating, or frequent urination) *BOWEL PROBLEMS (unusual diarrhea, constipation, pain near the anus) TENDERNESS IN MOUTH AND THROAT WITH OR WITHOUT PRESENCE OF ULCERS (sore throat, sores in mouth, or a toothache) UNUSUAL RASH, SWELLING OR PAIN  UNUSUAL VAGINAL DISCHARGE OR ITCHING   Items with * indicate a potential emergency and should be followed up as soon as possible or go to the Emergency Department if any problems should occur.  Please show the CHEMOTHERAPY ALERT CARD or IMMUNOTHERAPY ALERT CARD at  check-in to the Emergency Department and triage nurse.  Should you have questions after your visit or need to cancel or reschedule your appointment, please contact [facility]  Dept: [phone]  and follow the prompts.  Office hours are 8:00 a.m. to 4:30 p.m. Monday - Friday. Please note that voicemails left after 4:00 p.m. may not be returned until the following business day.  We are closed weekends and major holidays. You have access to a nurse at all times for urgent questions. Please call the main number to the clinic Dept: [phone] and follow the prompts.   For any non-urgent questions, you may also contact your provider using MyChart. We now offer e-Visits for anyone 52 and older to request care online for non-urgent symptoms. For details visit [email or web address].   Also download the MyChart app! Go to the app store, search "MyChart", open the app, select [facility], and log in with your MyChart username and password.  Due to Covid, a mask is required upon entering the hospital/clinic. If you do not have a mask, one will be given to you upon arrival. For doctor visits, patients may have 1 support person aged 49 or older with them. For treatment visits, patients cannot have anyone with them due to current Covid guidelines and our immunocompromised population.

## 2022-01-11 ENCOUNTER — Encounter: Payer: Self-pay | Admitting: Hematology

## 2022-01-11 LAB — KAPPA/LAMBDA LIGHT CHAINS
Kappa free light chain: 38.9 mg/L — ABNORMAL HIGH (ref 3.3–19.4)
Kappa, lambda light chain ratio: 1.28 (ref 0.26–1.65)
Lambda free light chains: 30.4 mg/L — ABNORMAL HIGH (ref 5.7–26.3)

## 2022-01-14 ENCOUNTER — Inpatient Hospital Stay (HOSPITAL_COMMUNITY): Payer: Commercial Managed Care - PPO

## 2022-01-14 ENCOUNTER — Inpatient Hospital Stay (HOSPITAL_COMMUNITY)
Admission: EM | Admit: 2022-01-14 | Discharge: 2022-01-20 | DRG: 023 | Disposition: A | Discharge status: Home / Self Care | Payer: Commercial Managed Care - PPO | Attending: Student | Admitting: Student

## 2022-01-14 ENCOUNTER — Encounter (HOSPITAL_COMMUNITY): Payer: Self-pay

## 2022-01-14 ENCOUNTER — Emergency Department (HOSPITAL_COMMUNITY): Payer: Commercial Managed Care - PPO

## 2022-01-14 ENCOUNTER — Other Ambulatory Visit: Payer: Self-pay

## 2022-01-14 ENCOUNTER — Telehealth: Payer: Self-pay

## 2022-01-14 ENCOUNTER — Encounter: Payer: Self-pay | Admitting: Hematology

## 2022-01-14 DIAGNOSIS — F17210 Nicotine dependence, cigarettes, uncomplicated: Secondary | ICD-10-CM | POA: Diagnosis present

## 2022-01-14 DIAGNOSIS — D631 Anemia in chronic kidney disease: Secondary | ICD-10-CM | POA: Diagnosis present

## 2022-01-14 DIAGNOSIS — N186 End stage renal disease: Secondary | ICD-10-CM | POA: Diagnosis present

## 2022-01-14 DIAGNOSIS — E875 Hyperkalemia: Secondary | ICD-10-CM | POA: Diagnosis present

## 2022-01-14 DIAGNOSIS — N400 Enlarged prostate without lower urinary tract symptoms: Secondary | ICD-10-CM | POA: Diagnosis present

## 2022-01-14 DIAGNOSIS — E440 Moderate protein-calorie malnutrition: Secondary | ICD-10-CM | POA: Diagnosis present

## 2022-01-14 DIAGNOSIS — F419 Anxiety disorder, unspecified: Secondary | ICD-10-CM | POA: Diagnosis present

## 2022-01-14 DIAGNOSIS — I771 Stricture of artery: Secondary | ICD-10-CM | POA: Diagnosis not present

## 2022-01-14 DIAGNOSIS — M25559 Pain in unspecified hip: Secondary | ICD-10-CM

## 2022-01-14 DIAGNOSIS — I132 Hypertensive heart and chronic kidney disease with heart failure and with stage 5 chronic kidney disease, or end stage renal disease: Secondary | ICD-10-CM | POA: Diagnosis present

## 2022-01-14 DIAGNOSIS — I5042 Chronic combined systolic (congestive) and diastolic (congestive) heart failure: Secondary | ICD-10-CM | POA: Diagnosis present

## 2022-01-14 DIAGNOSIS — Z6823 Body mass index (BMI) 23.0-23.9, adult: Secondary | ICD-10-CM | POA: Diagnosis not present

## 2022-01-14 DIAGNOSIS — M1612 Unilateral primary osteoarthritis, left hip: Secondary | ICD-10-CM | POA: Diagnosis not present

## 2022-01-14 DIAGNOSIS — R471 Dysarthria and anarthria: Secondary | ICD-10-CM | POA: Diagnosis present

## 2022-01-14 DIAGNOSIS — R29700 NIHSS score 0: Secondary | ICD-10-CM | POA: Diagnosis present

## 2022-01-14 DIAGNOSIS — I739 Peripheral vascular disease, unspecified: Secondary | ICD-10-CM | POA: Diagnosis present

## 2022-01-14 DIAGNOSIS — E781 Pure hyperglyceridemia: Secondary | ICD-10-CM | POA: Diagnosis present

## 2022-01-14 DIAGNOSIS — I639 Cerebral infarction, unspecified: Secondary | ICD-10-CM | POA: Diagnosis not present

## 2022-01-14 DIAGNOSIS — Z95828 Presence of other vascular implants and grafts: Secondary | ICD-10-CM | POA: Diagnosis not present

## 2022-01-14 DIAGNOSIS — N08 Glomerular disorders in diseases classified elsewhere: Secondary | ICD-10-CM | POA: Diagnosis present

## 2022-01-14 DIAGNOSIS — Z992 Dependence on renal dialysis: Secondary | ICD-10-CM

## 2022-01-14 DIAGNOSIS — I959 Hypotension, unspecified: Secondary | ICD-10-CM | POA: Diagnosis not present

## 2022-01-14 DIAGNOSIS — G8191 Hemiplegia, unspecified affecting right dominant side: Secondary | ICD-10-CM | POA: Diagnosis present

## 2022-01-14 DIAGNOSIS — Z20822 Contact with and (suspected) exposure to covid-19: Secondary | ICD-10-CM | POA: Diagnosis present

## 2022-01-14 DIAGNOSIS — E8581 Light chain (AL) amyloidosis: Secondary | ICD-10-CM | POA: Diagnosis present

## 2022-01-14 DIAGNOSIS — R29810 Facial weakness: Secondary | ICD-10-CM | POA: Diagnosis present

## 2022-01-14 DIAGNOSIS — C9000 Multiple myeloma not having achieved remission: Secondary | ICD-10-CM | POA: Diagnosis present

## 2022-01-14 DIAGNOSIS — I951 Orthostatic hypotension: Secondary | ICD-10-CM | POA: Diagnosis present

## 2022-01-14 DIAGNOSIS — I6501 Occlusion and stenosis of right vertebral artery: Secondary | ICD-10-CM | POA: Diagnosis present

## 2022-01-14 DIAGNOSIS — M25552 Pain in left hip: Secondary | ICD-10-CM | POA: Diagnosis present

## 2022-01-14 DIAGNOSIS — Z720 Tobacco use: Secondary | ICD-10-CM | POA: Diagnosis not present

## 2022-01-14 DIAGNOSIS — R52 Pain, unspecified: Secondary | ICD-10-CM

## 2022-01-14 DIAGNOSIS — E785 Hyperlipidemia, unspecified: Secondary | ICD-10-CM | POA: Diagnosis present

## 2022-01-14 DIAGNOSIS — D6481 Anemia due to antineoplastic chemotherapy: Secondary | ICD-10-CM | POA: Diagnosis present

## 2022-01-14 DIAGNOSIS — K219 Gastro-esophageal reflux disease without esophagitis: Secondary | ICD-10-CM | POA: Diagnosis present

## 2022-01-14 DIAGNOSIS — I6522 Occlusion and stenosis of left carotid artery: Secondary | ICD-10-CM | POA: Diagnosis not present

## 2022-01-14 DIAGNOSIS — I6523 Occlusion and stenosis of bilateral carotid arteries: Secondary | ICD-10-CM | POA: Diagnosis present

## 2022-01-14 DIAGNOSIS — Z79899 Other long term (current) drug therapy: Secondary | ICD-10-CM

## 2022-01-14 DIAGNOSIS — I63232 Cerebral infarction due to unspecified occlusion or stenosis of left carotid arteries: Secondary | ICD-10-CM | POA: Diagnosis not present

## 2022-01-14 DIAGNOSIS — I63512 Cerebral infarction due to unspecified occlusion or stenosis of left middle cerebral artery: Secondary | ICD-10-CM | POA: Diagnosis present

## 2022-01-14 DIAGNOSIS — T451X5A Adverse effect of antineoplastic and immunosuppressive drugs, initial encounter: Secondary | ICD-10-CM | POA: Diagnosis present

## 2022-01-14 DIAGNOSIS — M898X9 Other specified disorders of bone, unspecified site: Secondary | ICD-10-CM | POA: Diagnosis present

## 2022-01-14 LAB — RESP PANEL BY RT-PCR (FLU A&B, COVID) ARPGX2
Influenza A by PCR: NEGATIVE
Influenza B by PCR: NEGATIVE
SARS Coronavirus 2 by RT PCR: NEGATIVE

## 2022-01-14 LAB — CBC
HCT: 28.7 % — ABNORMAL LOW (ref 39.0–52.0)
Hemoglobin: 9.4 g/dL — ABNORMAL LOW (ref 13.0–17.0)
MCH: 29.9 pg (ref 26.0–34.0)
MCHC: 32.8 g/dL (ref 30.0–36.0)
MCV: 91.4 fL (ref 80.0–100.0)
Platelets: 205 10*3/uL (ref 150–400)
RBC: 3.14 MIL/uL — ABNORMAL LOW (ref 4.22–5.81)
RDW: 17.2 % — ABNORMAL HIGH (ref 11.5–15.5)
WBC: 6.6 10*3/uL (ref 4.0–10.5)
nRBC: 0 % (ref 0.0–0.2)

## 2022-01-14 LAB — DIFFERENTIAL
Abs Immature Granulocytes: 0.02 10*3/uL (ref 0.00–0.07)
Basophils Absolute: 0.1 10*3/uL (ref 0.0–0.1)
Basophils Relative: 1 %
Eosinophils Absolute: 0.1 10*3/uL (ref 0.0–0.5)
Eosinophils Relative: 1 %
Immature Granulocytes: 0 %
Lymphocytes Relative: 20 %
Lymphs Abs: 1.3 10*3/uL (ref 0.7–4.0)
Monocytes Absolute: 0.5 10*3/uL (ref 0.1–1.0)
Monocytes Relative: 8 %
Neutro Abs: 4.6 10*3/uL (ref 1.7–7.7)
Neutrophils Relative %: 70 %

## 2022-01-14 LAB — RETICULOCYTES
Immature Retic Fract: 9.3 % (ref 2.3–15.9)
RBC.: 3.17 MIL/uL — ABNORMAL LOW (ref 4.22–5.81)
Retic Count, Absolute: 29.5 10*3/uL (ref 19.0–186.0)
Retic Ct Pct: 0.9 % (ref 0.4–3.1)

## 2022-01-14 LAB — URINALYSIS, ROUTINE W REFLEX MICROSCOPIC
Bacteria, UA: NONE SEEN
Bilirubin Urine: NEGATIVE
Glucose, UA: 50 mg/dL — AB
Hgb urine dipstick: NEGATIVE
Ketones, ur: NEGATIVE mg/dL
Leukocytes,Ua: NEGATIVE
Nitrite: NEGATIVE
Protein, ur: >=300 mg/dL — AB
Specific Gravity, Urine: 1.01 (ref 1.005–1.030)
pH: 8 (ref 5.0–8.0)

## 2022-01-14 LAB — MULTIPLE MYELOMA PANEL, SERUM
Albumin SerPl Elph-Mcnc: 2.7 g/dL — ABNORMAL LOW (ref 2.9–4.4)
Albumin/Glob SerPl: 1.1 (ref 0.7–1.7)
Alpha 1: 0.3 g/dL (ref 0.0–0.4)
Alpha2 Glob SerPl Elph-Mcnc: 1 g/dL (ref 0.4–1.0)
B-Globulin SerPl Elph-Mcnc: 0.8 g/dL (ref 0.7–1.3)
Gamma Glob SerPl Elph-Mcnc: 0.5 g/dL (ref 0.4–1.8)
Globulin, Total: 2.5 g/dL (ref 2.2–3.9)
IgA: 36 mg/dL — ABNORMAL LOW (ref 61–437)
IgG (Immunoglobin G), Serum: 304 mg/dL — ABNORMAL LOW (ref 603–1613)
IgM (Immunoglobulin M), Srm: 59 mg/dL (ref 15–143)
Total Protein ELP: 5.2 g/dL — ABNORMAL LOW (ref 6.0–8.5)

## 2022-01-14 LAB — RAPID URINE DRUG SCREEN, HOSP PERFORMED
Amphetamines: NOT DETECTED
Barbiturates: NOT DETECTED
Benzodiazepines: POSITIVE — AB
Cocaine: NOT DETECTED
Opiates: NOT DETECTED
Tetrahydrocannabinol: NOT DETECTED

## 2022-01-14 LAB — COMPREHENSIVE METABOLIC PANEL
ALT: 12 U/L (ref 0–44)
AST: 14 U/L — ABNORMAL LOW (ref 15–41)
Albumin: 3.2 g/dL — ABNORMAL LOW (ref 3.5–5.0)
Alkaline Phosphatase: 86 U/L (ref 38–126)
Anion gap: 8 (ref 5–15)
BUN: 32 mg/dL — ABNORMAL HIGH (ref 8–23)
CO2: 23 mmol/L (ref 22–32)
Calcium: 8.3 mg/dL — ABNORMAL LOW (ref 8.9–10.3)
Chloride: 105 mmol/L (ref 98–111)
Creatinine, Ser: 3.68 mg/dL — ABNORMAL HIGH (ref 0.61–1.24)
GFR, Estimated: 17 mL/min — ABNORMAL LOW (ref >60)
Glucose, Bld: 114 mg/dL — ABNORMAL HIGH (ref 70–99)
Potassium: 4.5 mmol/L (ref 3.5–5.1)
Sodium: 136 mmol/L (ref 135–145)
Total Bilirubin: 0.6 mg/dL (ref 0.3–1.2)
Total Protein: 5.9 g/dL — ABNORMAL LOW (ref 6.5–8.1)

## 2022-01-14 LAB — I-STAT CHEM 8, ED
BUN: 30 mg/dL — ABNORMAL HIGH (ref 8–23)
Calcium, Ion: 1.09 mmol/L — ABNORMAL LOW (ref 1.15–1.40)
Chloride: 104 mmol/L (ref 98–111)
Creatinine, Ser: 3.9 mg/dL — ABNORMAL HIGH (ref 0.61–1.24)
Glucose, Bld: 110 mg/dL — ABNORMAL HIGH (ref 70–99)
HCT: 27 % — ABNORMAL LOW (ref 39.0–52.0)
Hemoglobin: 9.2 g/dL — ABNORMAL LOW (ref 13.0–17.0)
Potassium: 4.5 mmol/L (ref 3.5–5.1)
Sodium: 135 mmol/L (ref 135–145)
TCO2: 22 mmol/L (ref 22–32)

## 2022-01-14 LAB — IRON AND TIBC
Iron: 145 ug/dL (ref 45–182)
Saturation Ratios: 47 % — ABNORMAL HIGH (ref 17.9–39.5)
TIBC: 308 ug/dL (ref 250–450)
UIBC: 163 ug/dL

## 2022-01-14 LAB — VITAMIN B12: Vitamin B-12: 322 pg/mL (ref 180–914)

## 2022-01-14 LAB — APTT: aPTT: 27 seconds (ref 24–36)

## 2022-01-14 LAB — FOLATE: Folate: 77.1 ng/mL (ref >5.9)

## 2022-01-14 LAB — ETHANOL: Alcohol, Ethyl (B): <10 mg/dL (ref <10)

## 2022-01-14 LAB — PROTIME-INR
INR: 1 (ref 0.8–1.2)
Prothrombin Time: 13.2 seconds (ref 11.4–15.2)

## 2022-01-14 LAB — FERRITIN: Ferritin: 163 ng/mL (ref 24–336)

## 2022-01-14 IMAGING — CT CT ANGIO NECK
1 of 10 series · 6 of 33 positions shown · non-contrast
Comparison: [DATE] CT head and MRI

CLINICAL DATA: Right hand weakness, right-sided facial droop and
slurred speech

EXAM:
CT ANGIOGRAPHY HEAD AND NECK
TECHNIQUE: Multidetector CT imaging of the head and neck was performed using
the standard protocol during bolus administration of intravenous
contrast. Multiplanar CT image reconstructions and MIPs were
obtained to evaluate the vascular anatomy. Carotid stenosis
measurements (when applicable) are obtained utilizing NASCET
criteria, using the distal internal carotid diameter as the
denominator.

[Series 12: ax thin · axial · 0.39mm/px · z∈[+1317,+1589]mm · 6 of 380 slices shown]
[im 55/380  soft-tissue]
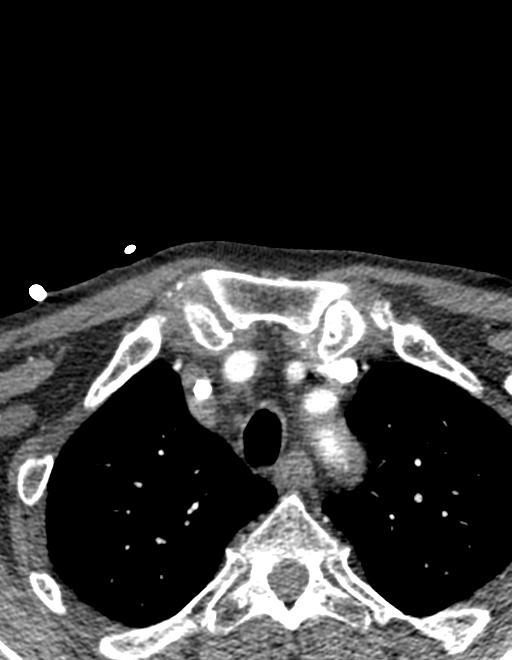
[im 109/380  bone]
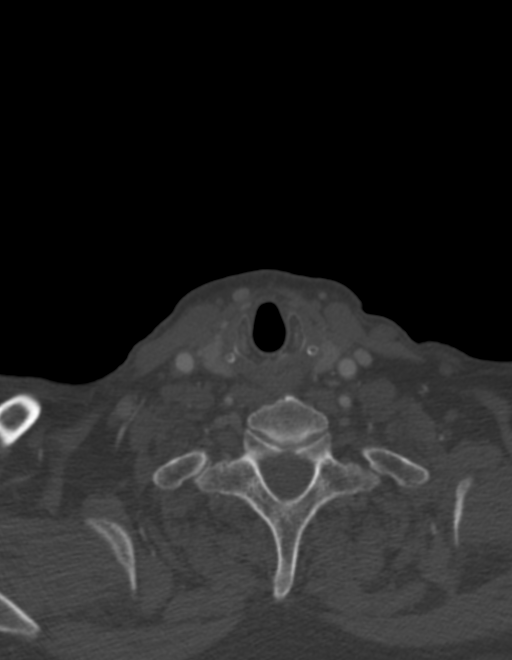
[im 163/380  soft-tissue]
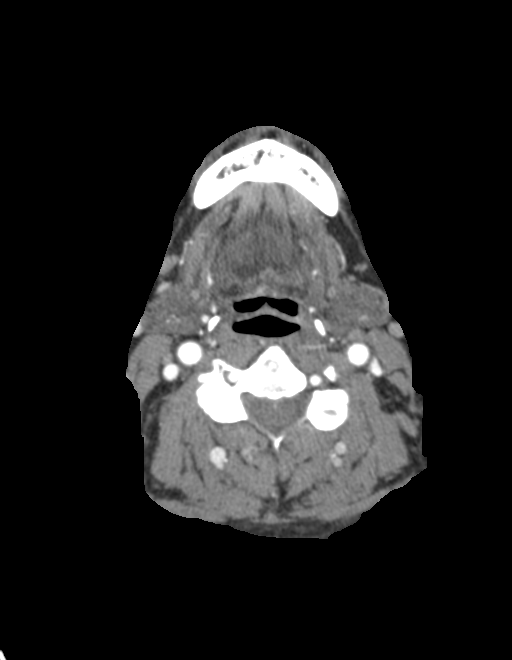
[im 217/380  bone]
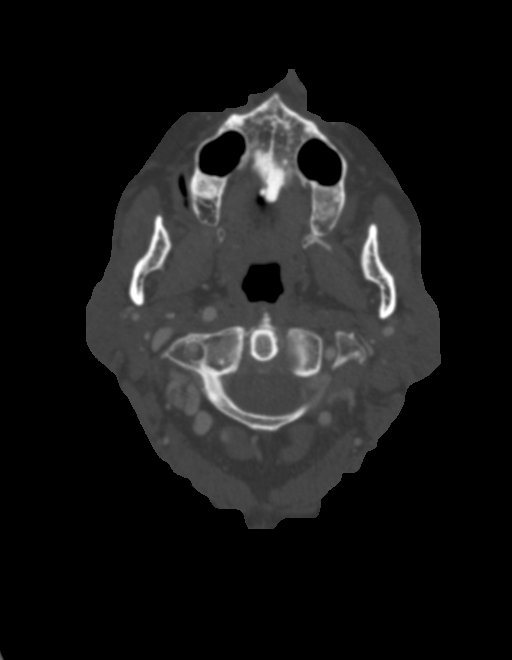
[im 271/380  soft-tissue]
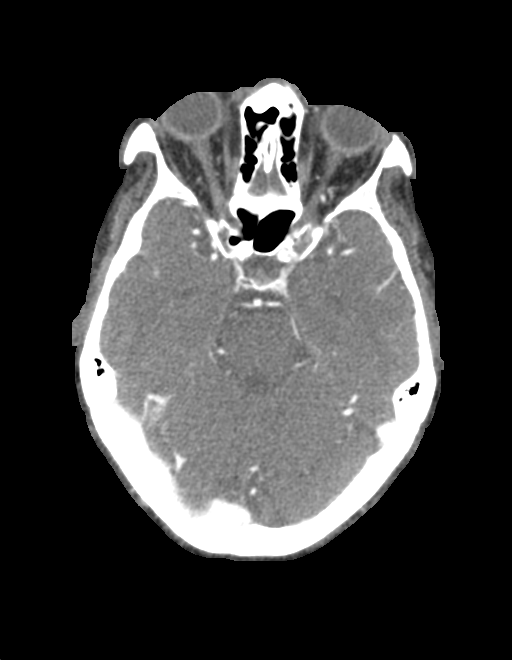
[im 325/380  bone]
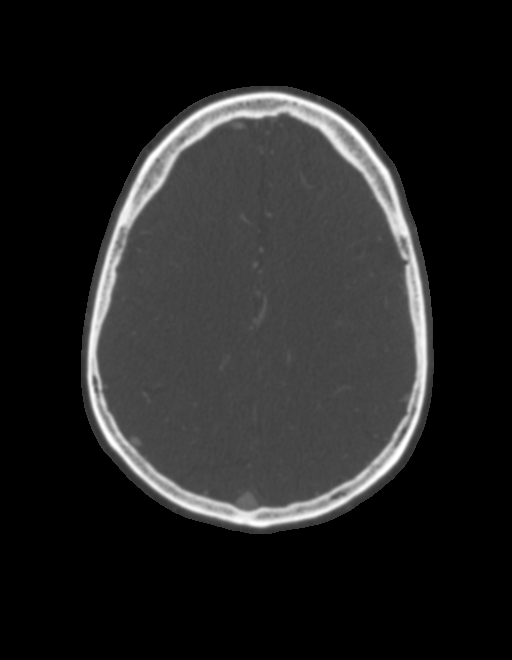

[6 of 33 positions shown; findings below may reference images not displayed]

RADIATION DOSE REDUCTION: This exam was performed according to the
departmental dose-optimization program which includes automated
exposure control, adjustment of the mA and/or kV according to
patient size and/or use of iterative reconstruction technique.

CONTRAST:  100mL OMNIPAQUE IOHEXOL 350 MG/ML SOLN
FINDINGS: CT HEAD FINDINGS

Brain: No evidence of acute hemorrhage, cerebral edema, mass, mass
effect, or midline shift. No hydrocephalus or extra-axial fluid
collection. Periventricular white matter changes, likely the sequela
of chronic small vessel ischemic disease. The acute infarcts seen on
the same-day MRI are not significantly apparent on this CT.

Vascular: No hyperdense vessel.

Skull: Normal. Negative for fracture or focal lesion.

Sinuses/Orbits: No acute finding.

Other: The mastoid air cells are well aerated.

CTA NECK FINDINGS

Aortic arch: Standard branching. Imaged portion shows no evidence of
aneurysm or dissection. No significant stenosis of the major arch
vessel origins.

Right carotid system: No evidence of dissection, stenosis (50% or
greater) or occlusion.

Left carotid system: Evaluation of the origin of the left common
carotid artery is somewhat limited due to beam hardening artifact
from the adjacent venous bolus, however there appears to be mild
narrowing at the origin. Complete occlusion of the left internal
carotid artery, just distal to the bifurcation (series 12, image
[id number]). No distal reconstitution of the extracranial ICA.

Vertebral arteries: Moderate narrowing at the origin of the right
vertebral artery, secondary to noncalcified plaque. Focal moderate
stenosis of the right V2 (series 12, image 248) at the level of C6,
as well as in the right C5-C6 foramen, secondary to degenerative
changes (series 12, image 242). The right vertebral artery is
otherwise patent. The left vertebral artery demonstrates mild
calcified narrowing in the V2 segment, but is otherwise patent.
Codominant.

Skeleton: Multilevel degenerative changes in the cervical spine,
with trace retrolisthesis of C5 on C6. No acute osseous abnormality.

Other neck: Negative.

Upper chest: Dependent atelectasis.

Review of the MIP images confirms the above findings

CTA HEAD FINDINGS

Evaluation is somewhat limited by degree of venous contamination.

Anterior circulation: The left intracranial internal carotid artery
is non-opacified until the ophthalmic and supraclinoid portions
(series 12, image 111). Calcifications in the right intracranial
internal carotid artery, which cause moderate narrowing in the
distal cavernous segment and proximal supraclinoid segment.

Occlusion of the left A1 (series 12, image 100), with a small area
of filling in the distal left A1, likely retrograde. Patent right
A1. Normal anterior communicating artery. Anterior cerebral arteries
are patent to their distal aspects.

No M1 stenosis or occlusion. Normal MCA bifurcations. Distal MCA
branches perfused and symmetric.

Posterior circulation: Vertebral arteries patent to the
vertebrobasilar junction, with mild calcifications and irregularity
in the right V4 segment, prior to the PICA takeoff (series 12, image
145), which causes mild-to-moderate narrowing. Mild to moderate
narrowing in the left V4, proximal to the left PICA takeoff (series
12, image 144). Posterior inferior cerebral arteries patent
bilaterally.

Basilar patent to its distal aspect. Superior cerebellar arteries
patent bilaterally.

Bilateral P1 segments originate from the basilar artery. PCAs
perfused to their distal aspects, with mild narrowing in the left
P1/P2 and poor opacification of the distal left P2 and P3 segments.
The bilateral posterior communicating arteries are not definitively
seen.

Venous sinuses: As permitted by contrast timing, patent.

Anatomic variants: None significant

Review of the MIP images confirms the above findings
IMPRESSION: 1. Complete occlusion of the left internal carotid artery, just
distal to the bifurcation. The left intracranial internal carotid
artery reconstitutes in the ophthalmic and supraclinoid portions.
2. Moderate narrowing in the distal right cavernous ICA and proximal
supraclinoid segment.
3. Occlusion of the left A1.  The left ACA is otherwise patent.
4. Moderate stenosis at the origin of the right vertebral artery,
with additional focal moderate stenosis of the right V2 at the level
of C6, both secondary to atherosclerotic plaque and degenerative
changes. Mild-to-moderate stenosis of the bilateral V4 segments.
5. Poor opacification of the distal left P2 and P3 segments.

These results were called by telephone at the time of interpretation
on [DATE] at [DATE] to provider DR. INGE , who verbally
acknowledged these results.

## 2022-01-14 IMAGING — CR DG CHEST 2V
2 series · 2 of 2 positions shown · non-contrast
Comparison: [DATE]

CLINICAL DATA: Facial droop

EXAM:
CHEST - 2 VIEW

[w chest pa]
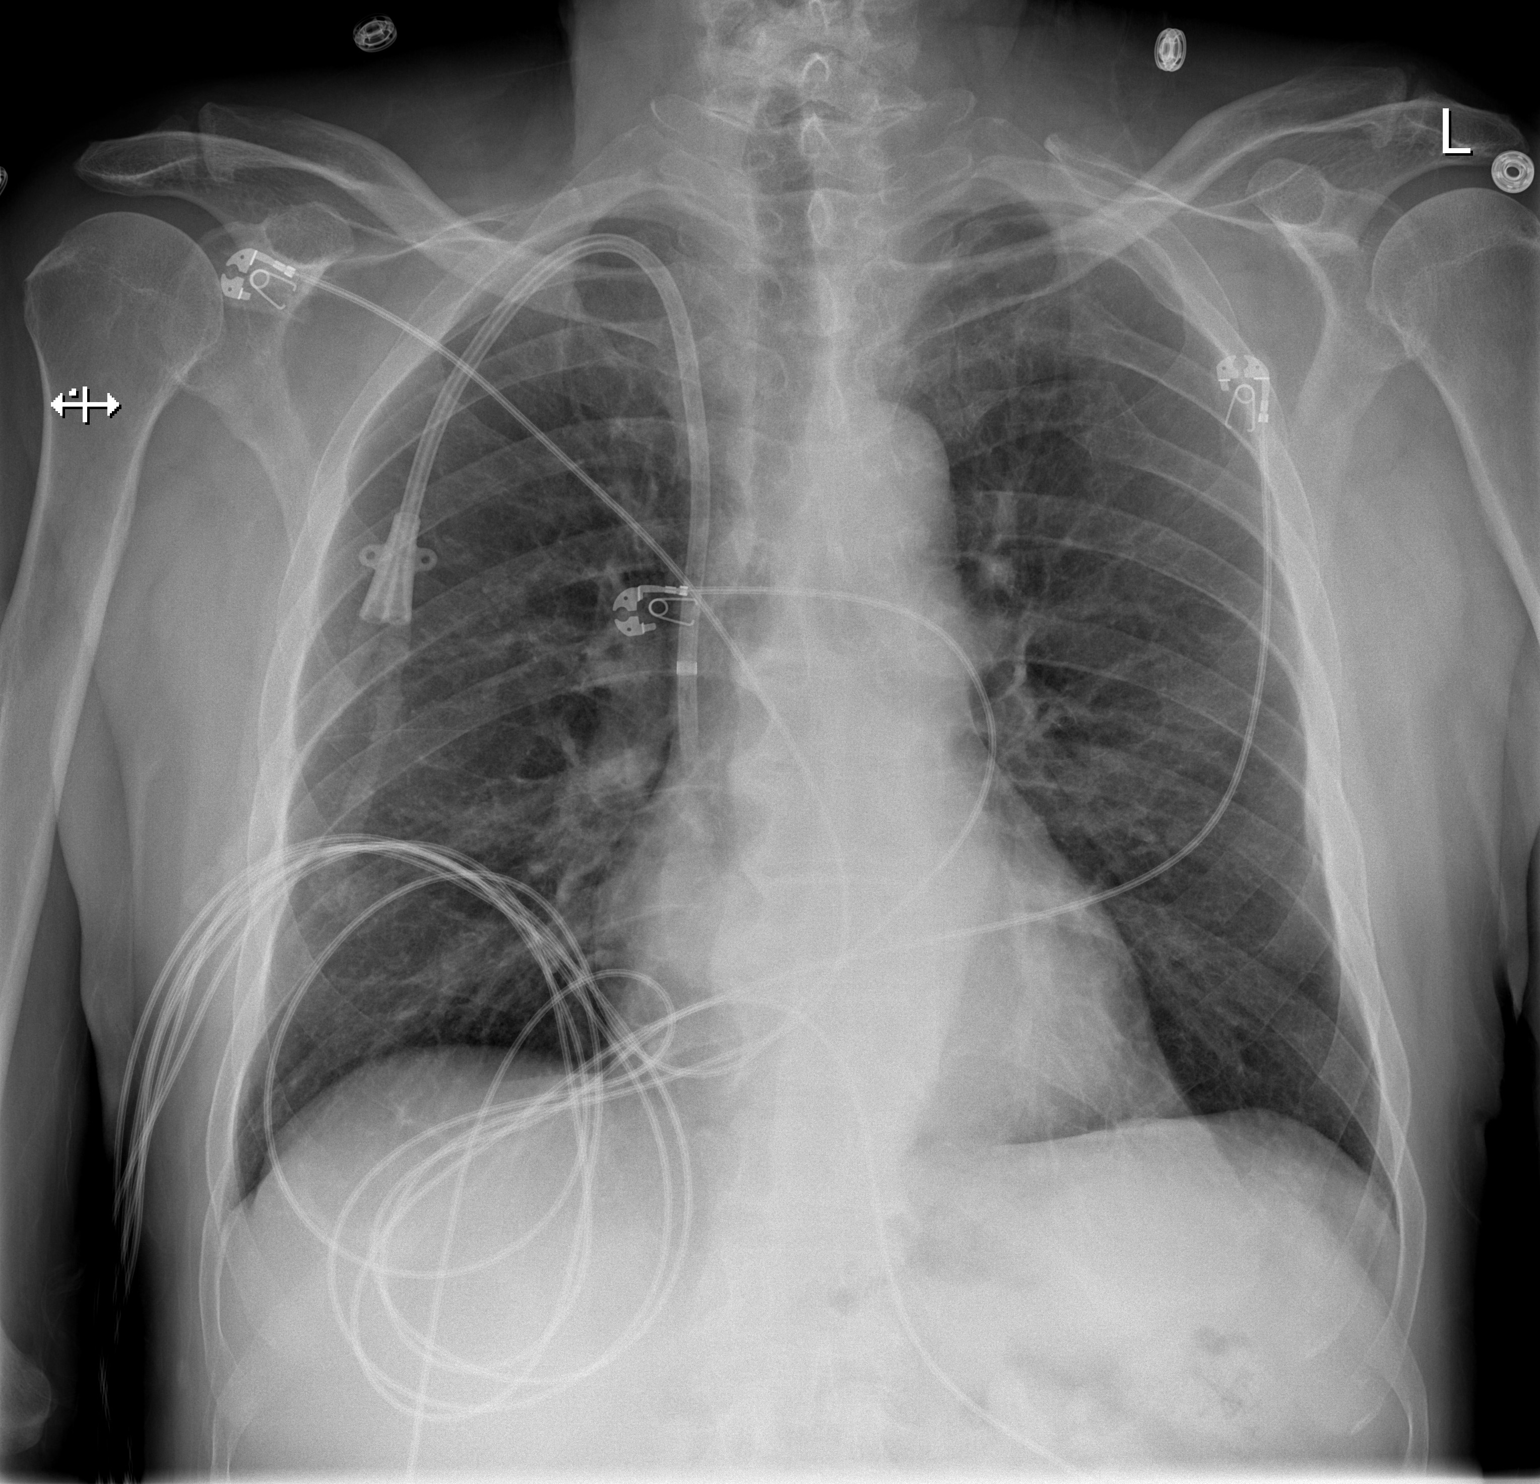

[w chest lat]
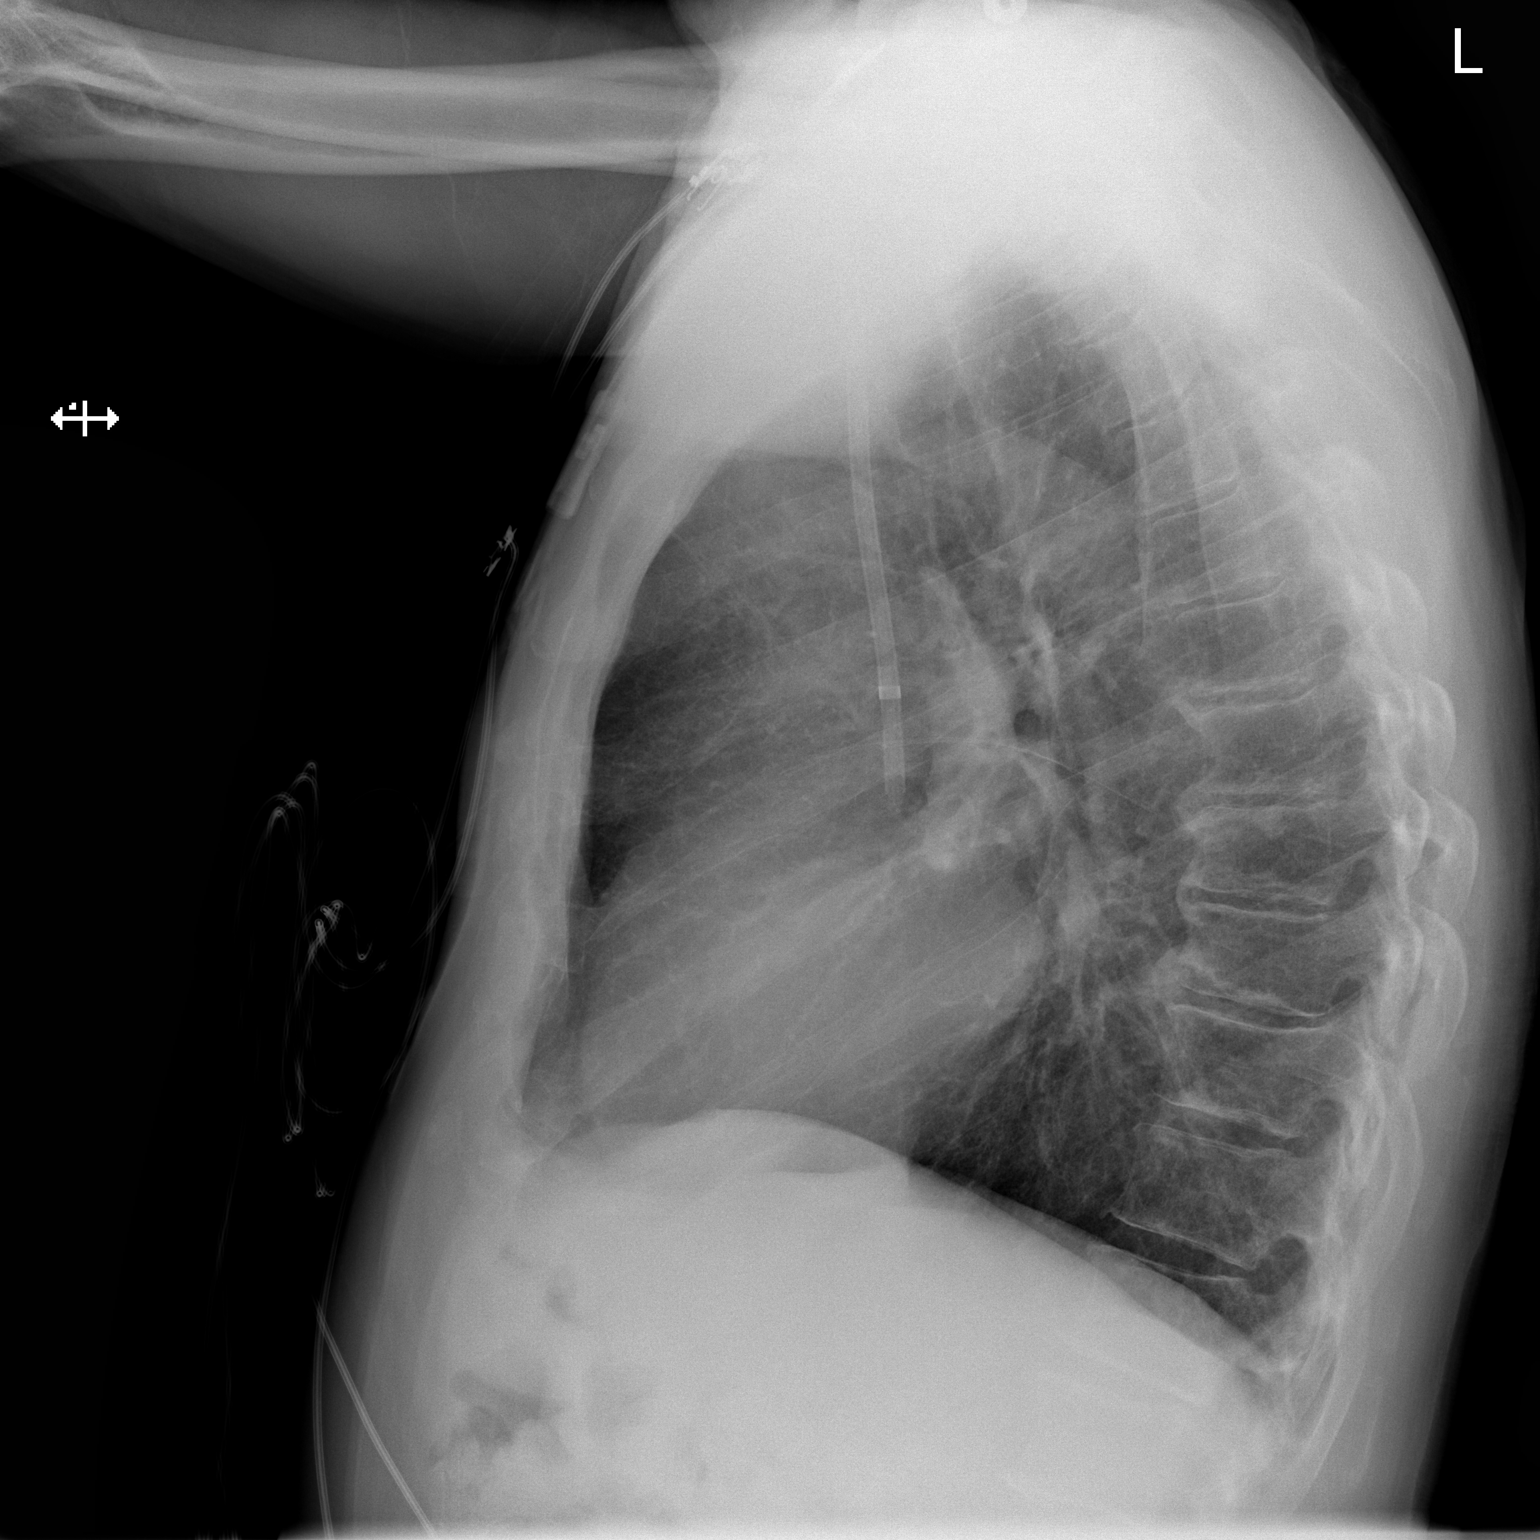

[2 of 2 positions shown; findings below may reference images not displayed]

FINDINGS: Right dialysis catheter remains in place, unchanged. Heart and
mediastinal contours are within normal limits. No focal opacities or
effusions. No acute bony abnormality. Old healed left rib fractures.
IMPRESSION: No active cardiopulmonary disease.

## 2022-01-14 IMAGING — CT CT HEAD W/O CM
4 series · 15 of 47 positions shown, 17 images · non-contrast
Comparison: None.

CLINICAL DATA: Facial droop and right arm weakness and numbness for
the past 2 days.



[Series 2: head wo · axial · 0.48mm/px · z∈[-111,-6]mm · 7 of 29 slices shown, 9 images]
[im 4/29  brain]
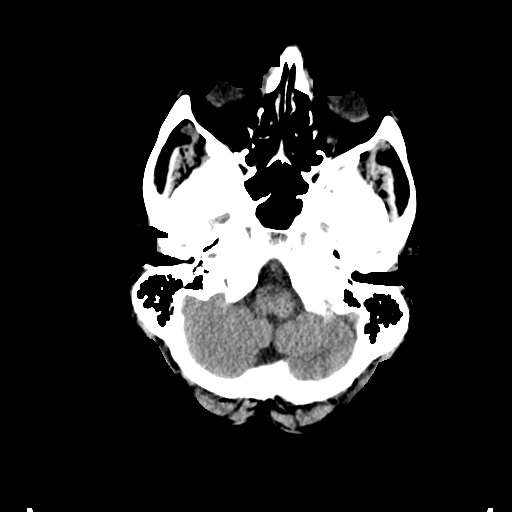
[im 4/29  bone]
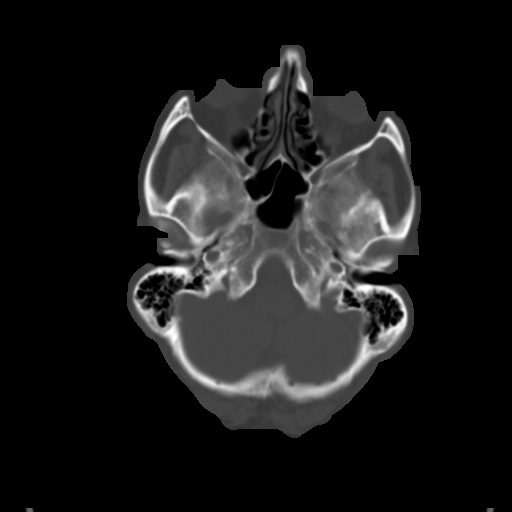
[im 8/29  brain]
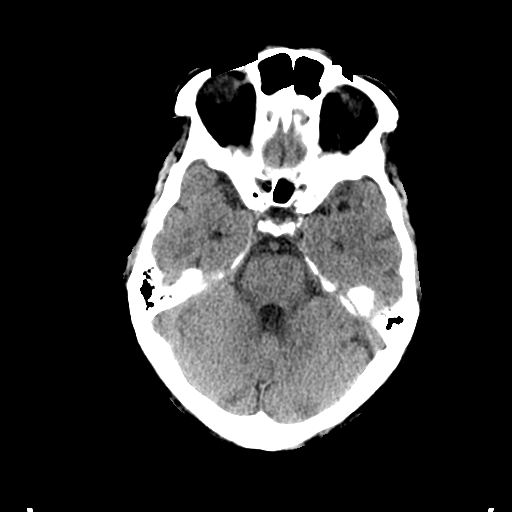
[im 11/29  brain]
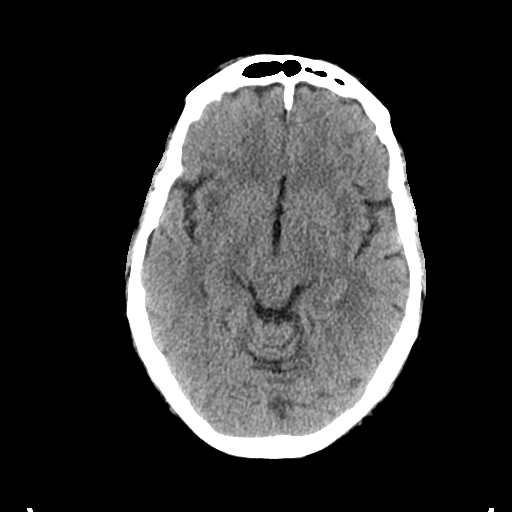
[im 15/29  brain]
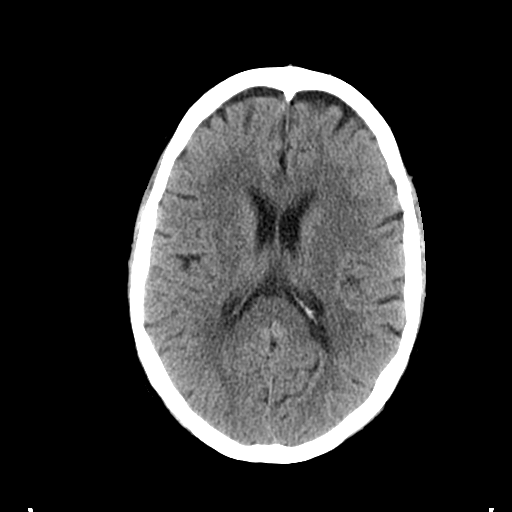
[im 18/29  brain]
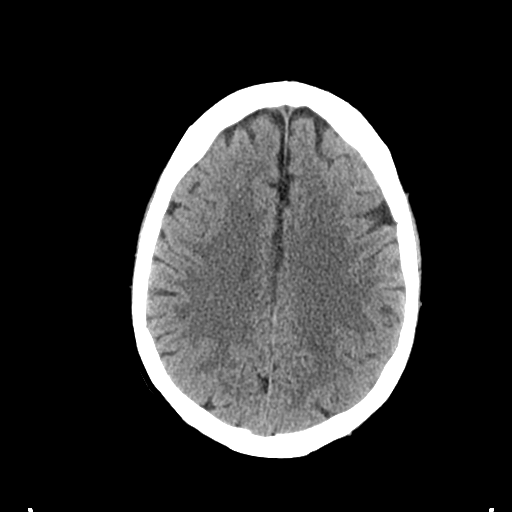
[im 18/29  bone]
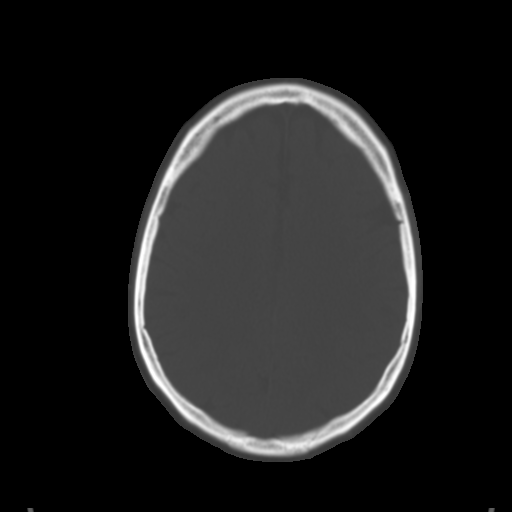
[im 22/29  brain]
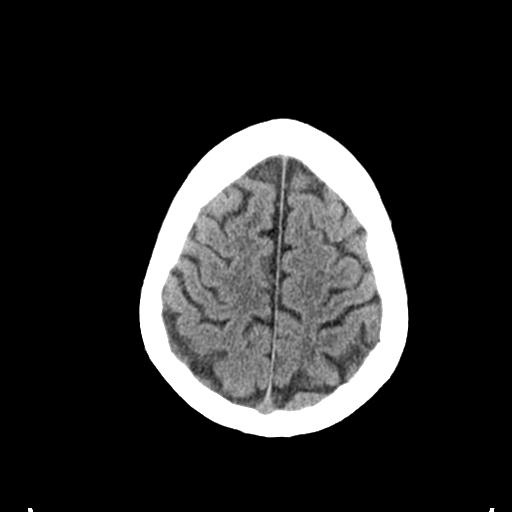
[im 25/29  brain]
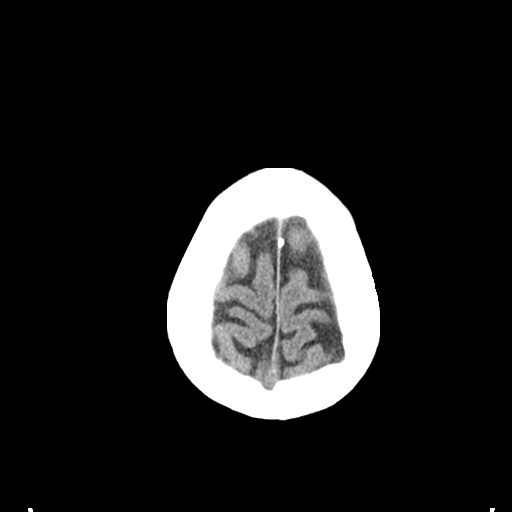

[Series 3: head bone · axial · 0.48mm/px · z∈[-112,-98]mm · 2 of 73 slices shown]
[im 8/73  bone]
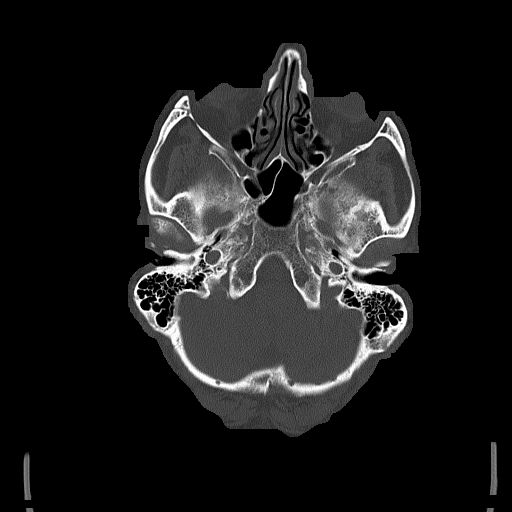
[im 15/73  bone]
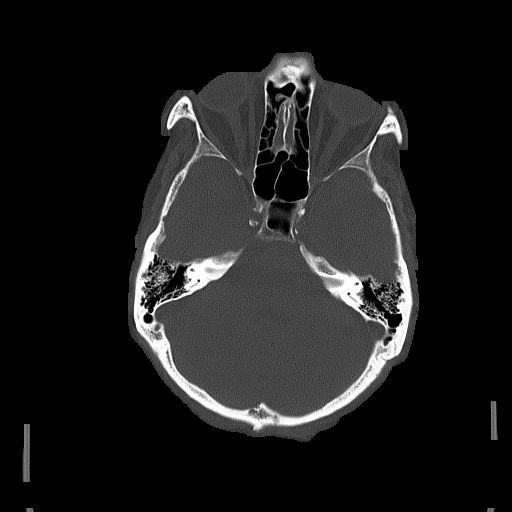

[Series 4: coronal soft tissue · coronal · 0.32mm/px · 3 of 68 slices shown]
[im 23/68  brain]
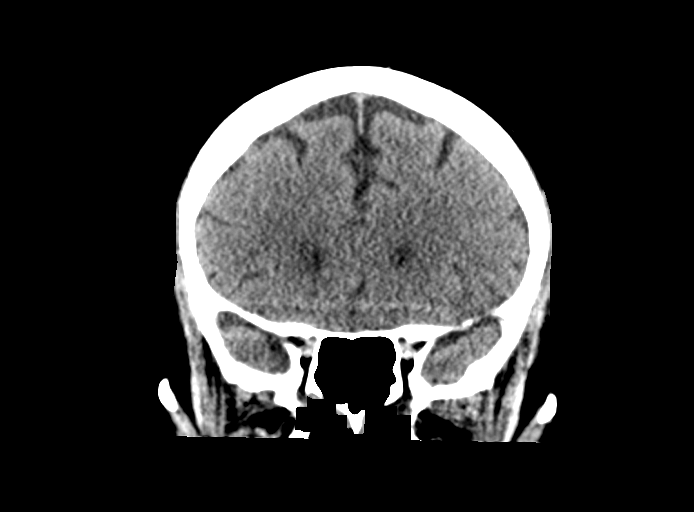
[im 30/68  brain]
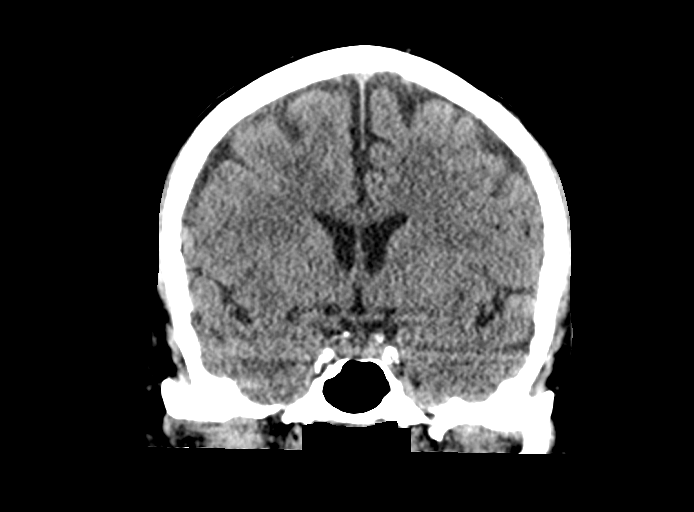
[im 38/68  brain]
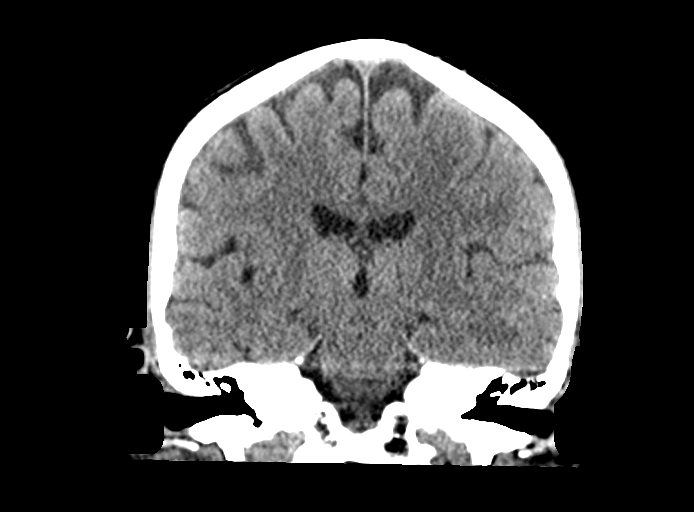

[Series 5: sagittal soft tissue · sagittal · 0.39mm/px · 3 of 49 slices shown]
[im 17/49  brain]
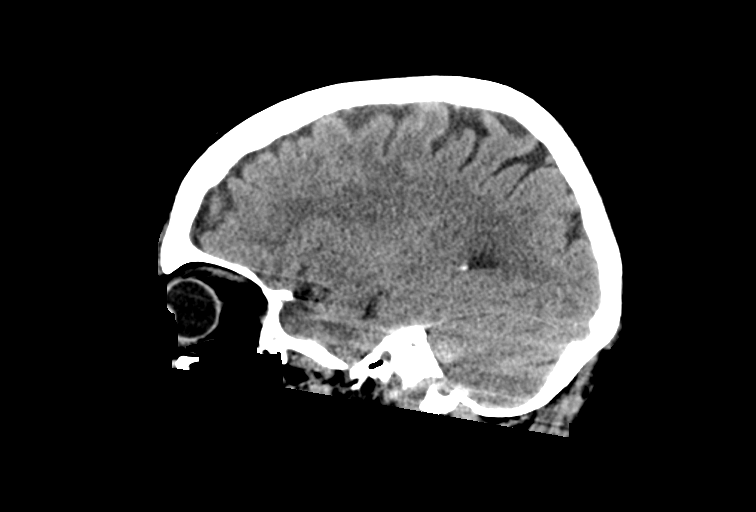
[im 25/49  brain]
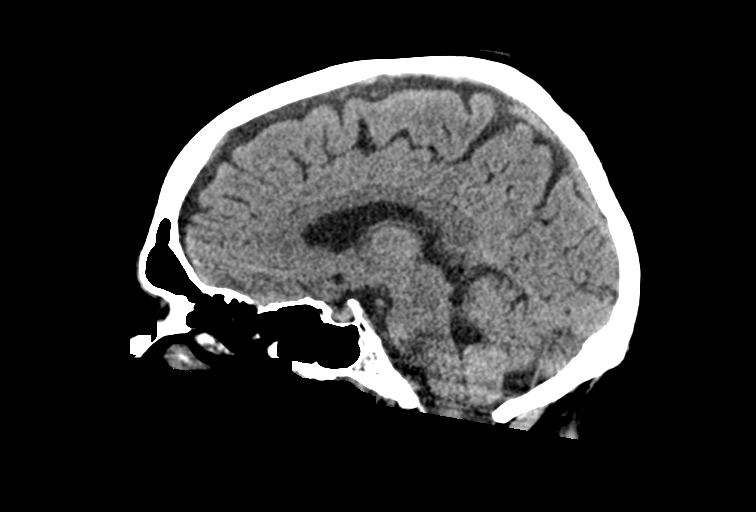
[im 33/49  brain]
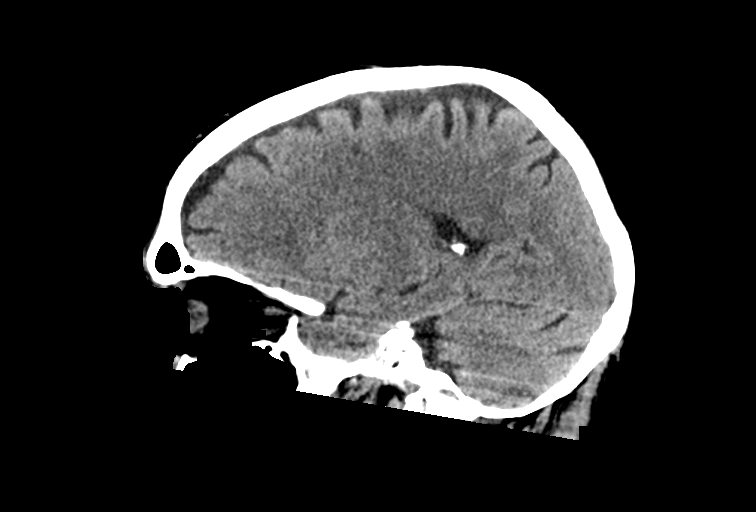

[15 of 47 positions shown; findings below may reference images not displayed]

FINDINGS: Brain: Normal size and position of the ventricles. Minimal patchy
white matter low density in both cerebral hemispheres. No
intracranial hemorrhage, mass lesion or CT evidence of acute
infarction.

Vascular: No hyperdense vessel or unexpected calcification.

Skull: Normal. Negative for fracture or focal lesion.

Sinuses/Orbits: Unremarkable.

Other: None.
IMPRESSION: 1. No acute abnormality.
2. Minimal chronic small vessel white matter ischemic changes in
both cerebral hemispheres. These make it difficult to exclude a
small acute subcortical infarct. If this is a continued clinical
concern, this could be further evaluated with an MRI of the brain.

## 2022-01-14 IMAGING — CR DG HIP (WITH OR WITHOUT PELVIS) 5+V BILAT
5 series · 5 of 5 positions shown · non-contrast
Comparison: None.

CLINICAL DATA: Bilateral hip pain

EXAM:
DG HIP (WITH OR WITHOUT PELVIS) 5+V BILAT

[x pelvis]
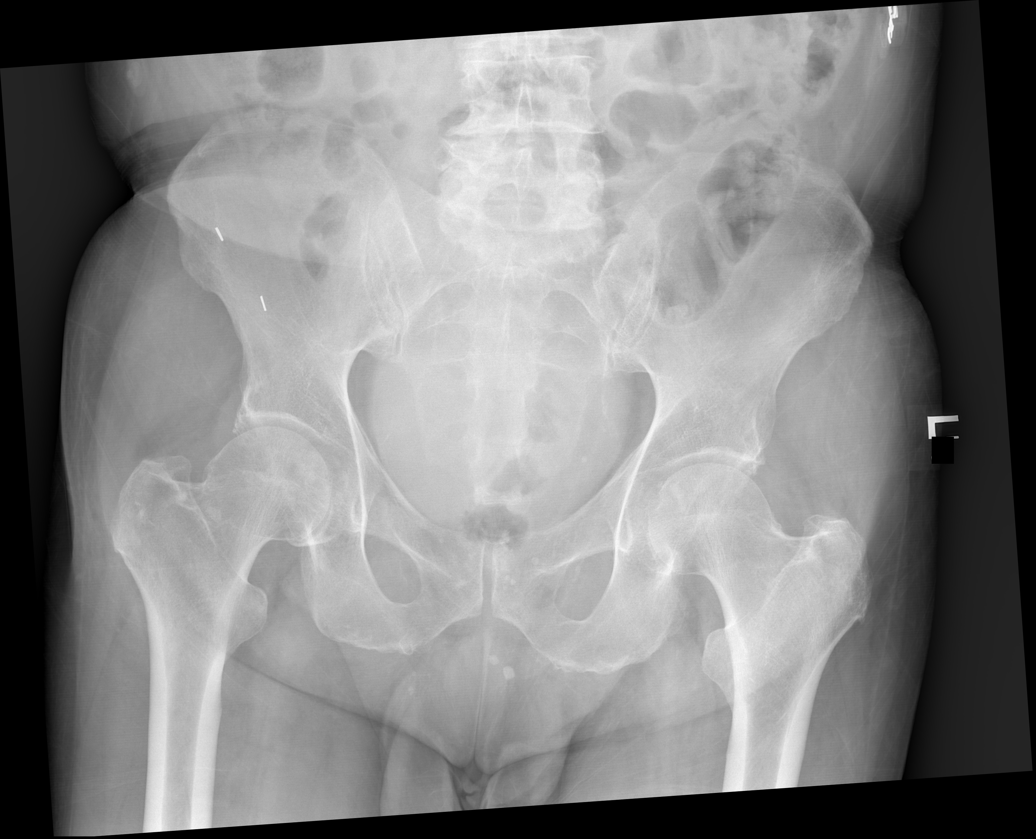

[x hip ap left (1 of 2)]
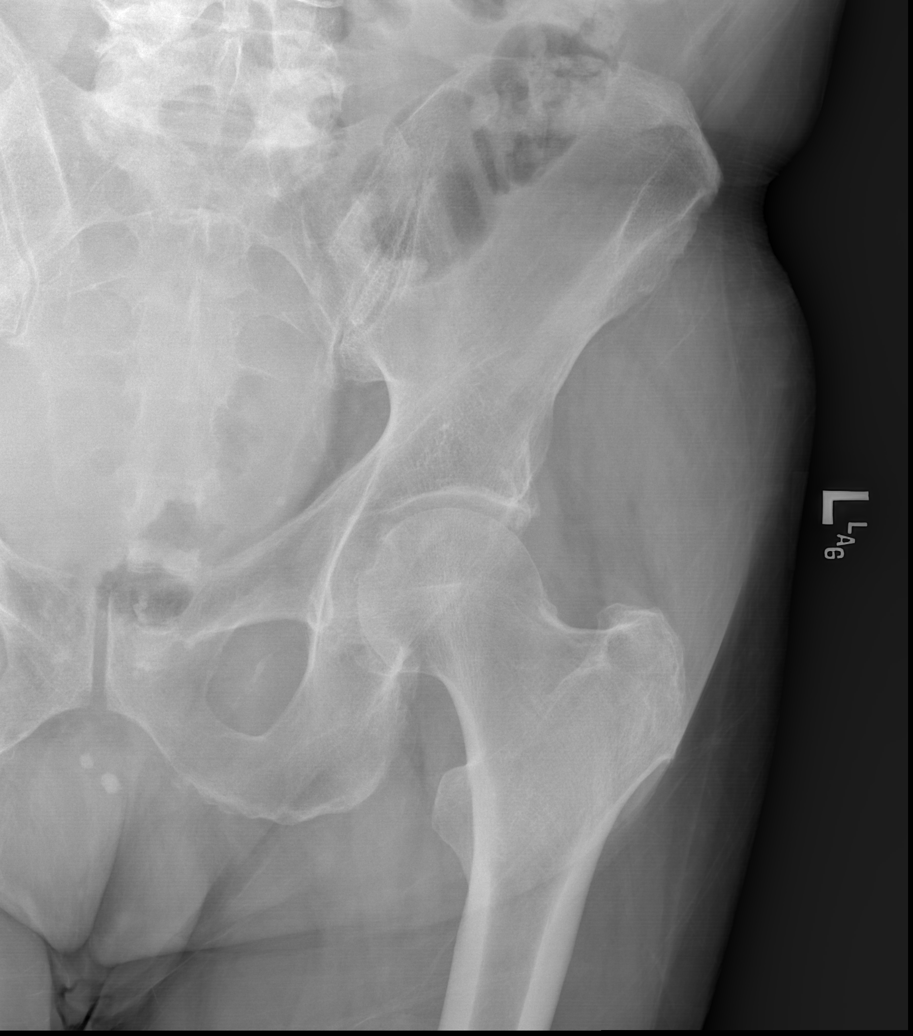

[x hip ap left (2 of 2)]
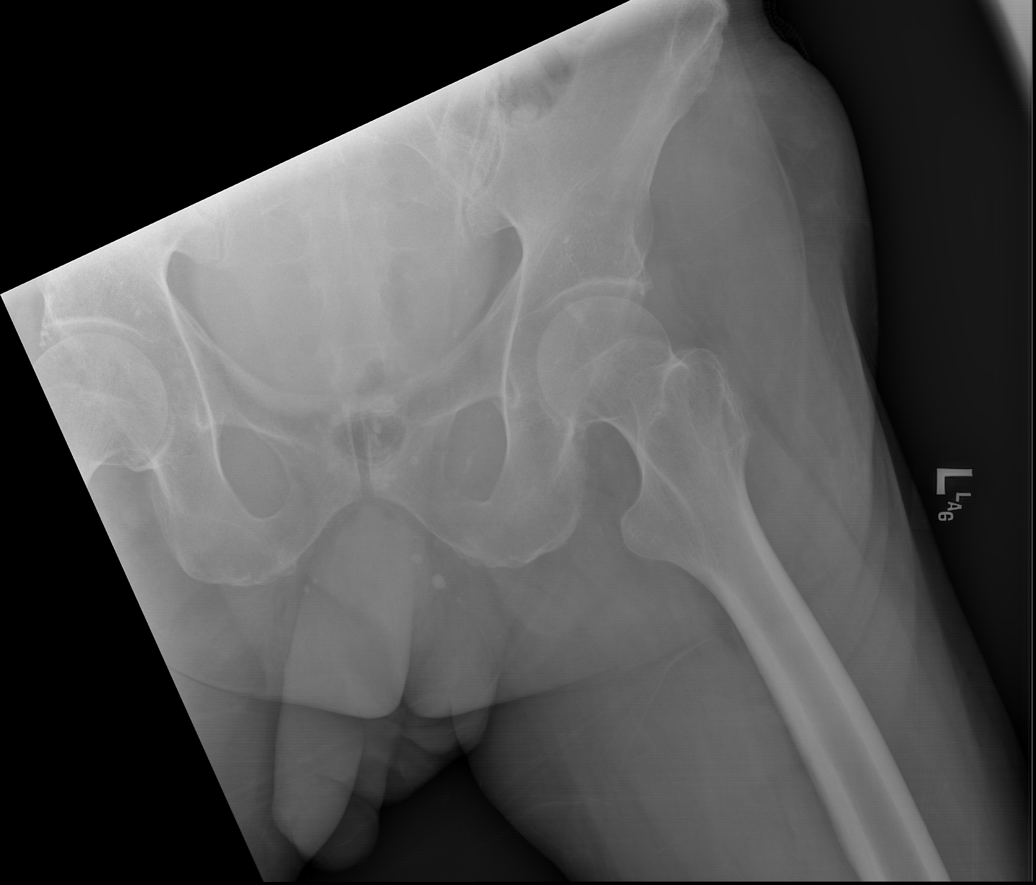

[x hip ap right]
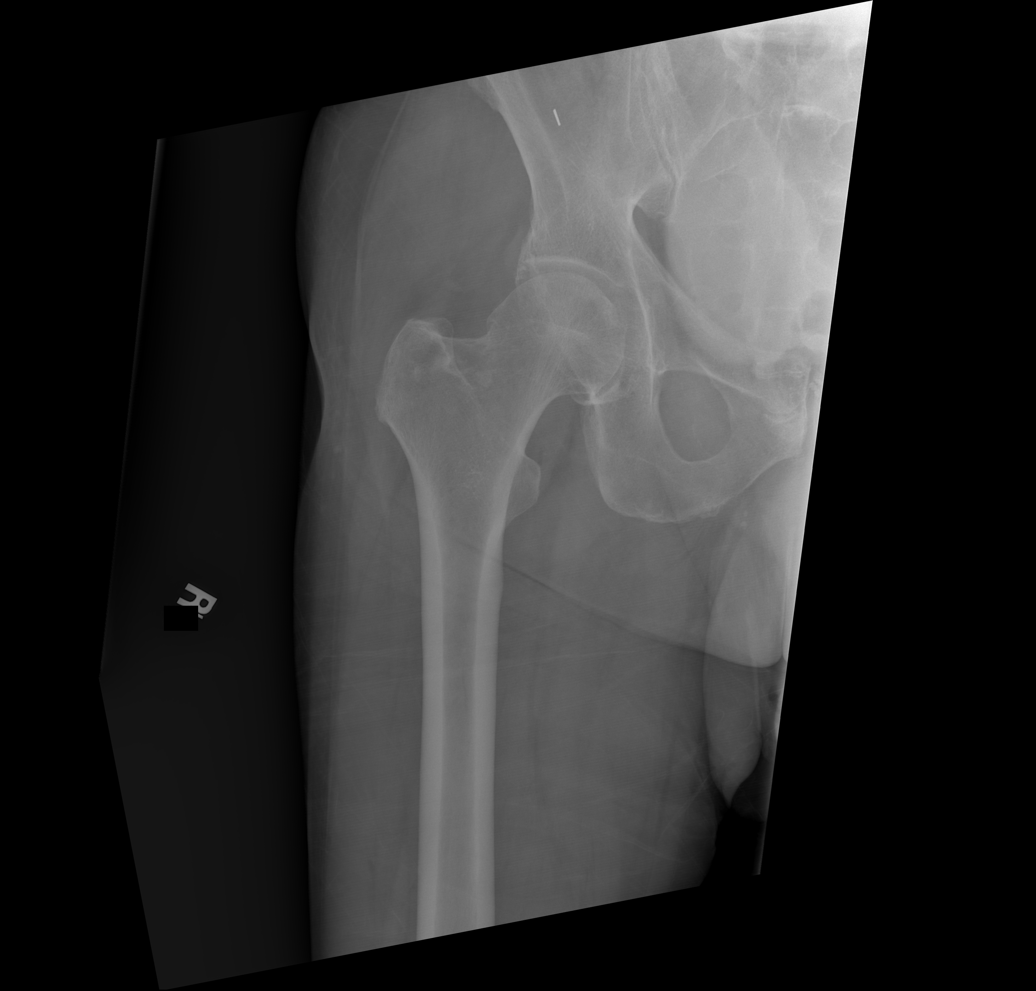

[x hip lat right]
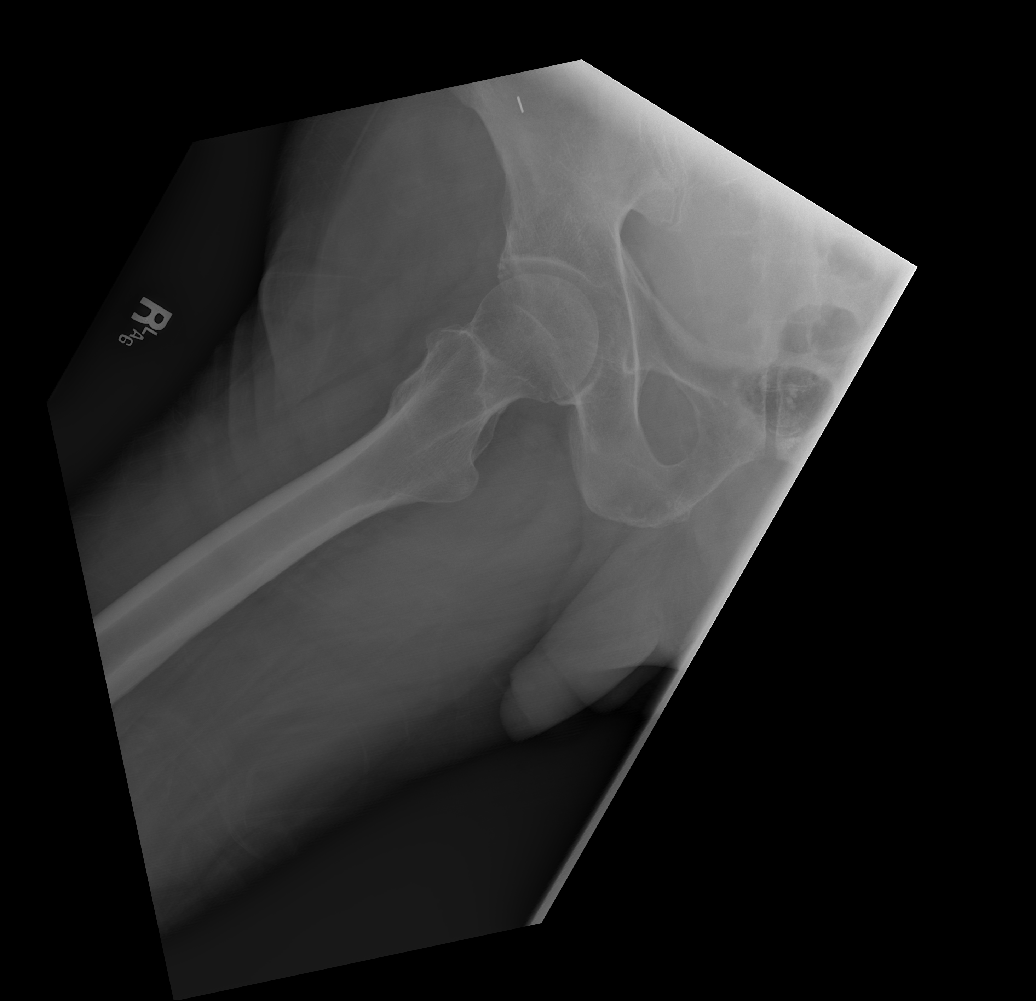

[5 of 5 positions shown; findings below may reference images not displayed]

FINDINGS: There is no evidence of hip fracture or dislocation. There is no
evidence of arthropathy or other focal bone abnormality.
IMPRESSION: Negative.

## 2022-01-14 IMAGING — DX DG CHEST 1V PORT
1 series · 1 of 1 positions shown · non-contrast
Comparison: [DATE]

CLINICAL DATA: Weakness.

EXAM:
PORTABLE CHEST 1 VIEW

[chest ap]
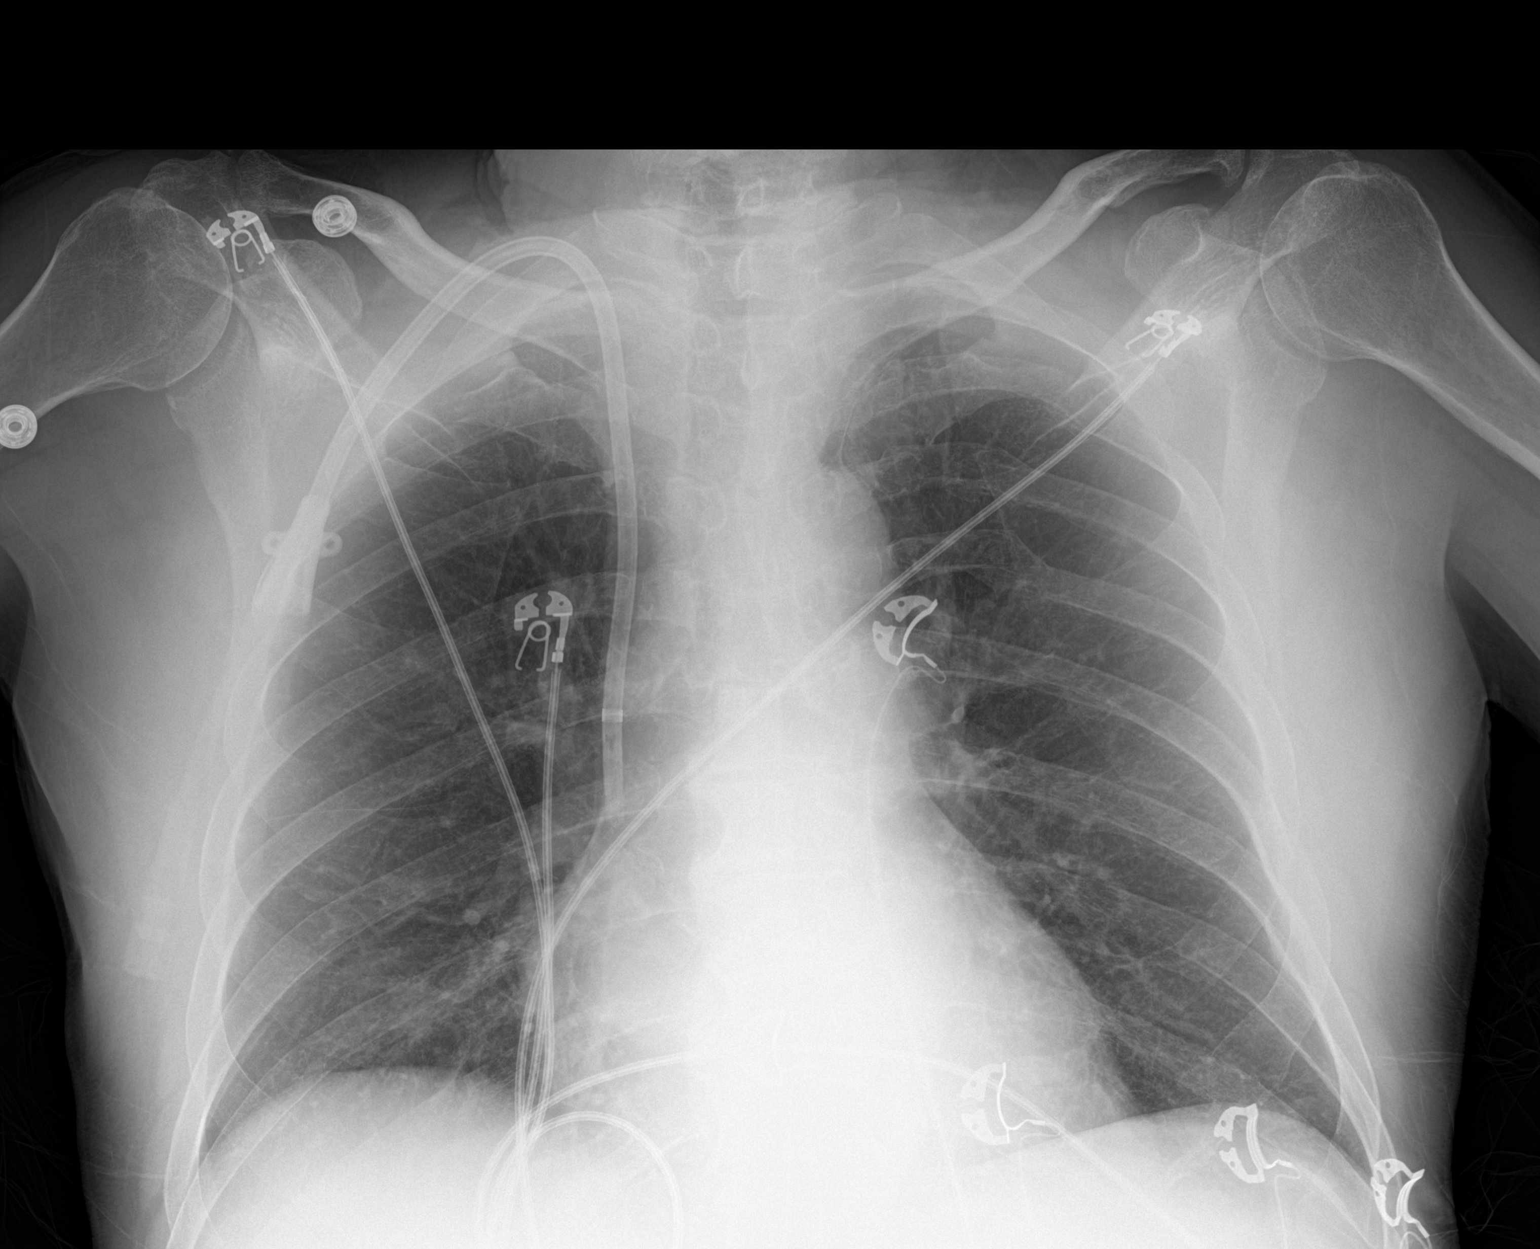

[1 of 1 positions shown; findings below may reference images not displayed]

FINDINGS: Dialysis catheter with tip at the upper cavoatrial junction. There
is no edema, consolidation, effusion, or pneumothorax. Artifact from
EKG leads. Normal heart size and mediastinal contours.
IMPRESSION: No evidence of active disease.

## 2022-01-14 IMAGING — MR MR HEAD W/O CM
10 series · 48 of 48 positions shown · non-contrast
Comparison: Head CT same day

CLINICAL DATA: Neuro deficit, acute, stroke suspected.

EXAM:
MRI HEAD WITHOUT CONTRAST
TECHNIQUE: Multiplanar, multiecho pulse sequences of the brain and surrounding
structures were obtained without intravenous contrast.

[Series 5: DWI · axial · 3.0mm · 1.36mm/px · z∈[-30,+108]mm · 9 of 96 slices shown (1 of 2)]
[im 1/96]
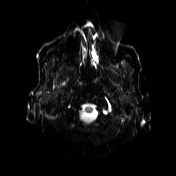
[im 12/96]
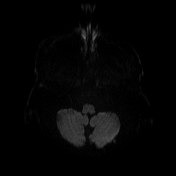
[im 24/96]
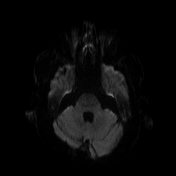
[im 36/96]
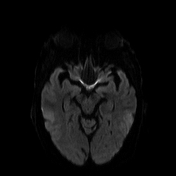
[im 48/96]
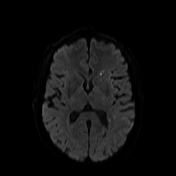
[im 60/96]
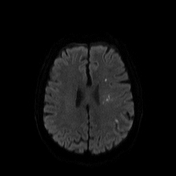
[im 72/96]
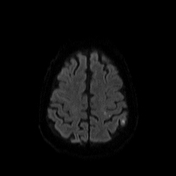
[im 84/96]
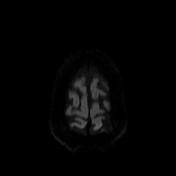
[im 96/96]
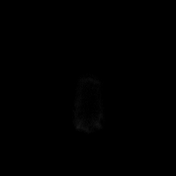

[Series 6: DWI · axial · 3.0mm · 1.36mm/px · z∈[-30,+108]mm · 5 of 48 slices shown (2 of 2)]
[im 1/48]
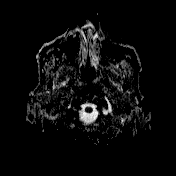
[im 12/48]
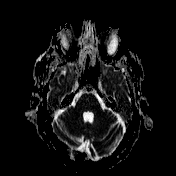
[im 24/48]
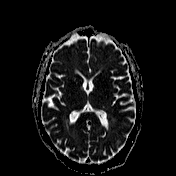
[im 36/48]
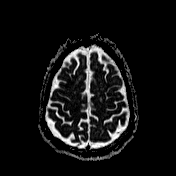
[im 48/48]
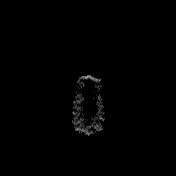

[Series 7: T1 · sagittal · 5.0mm · 0.81mm/px · 2 of 24 slices shown (1 of 2)]
[im 1/24]
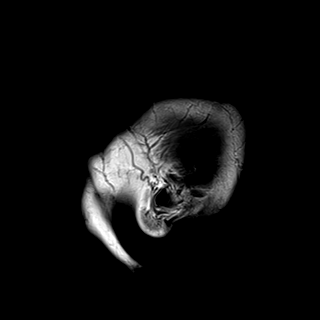
[im 24/24]
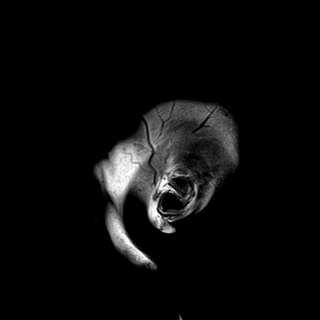

[Series 8: T2 · axial · 5.0mm · 0.62mm/px · z∈[-28,+117]mm · 2 of 24 slices shown (1 of 2)]
[im 1/24]
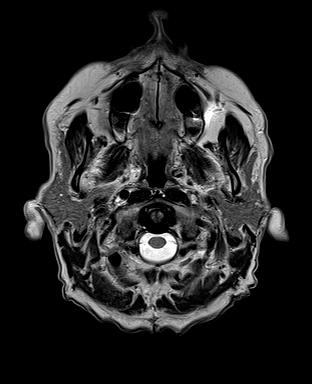
[im 24/24]
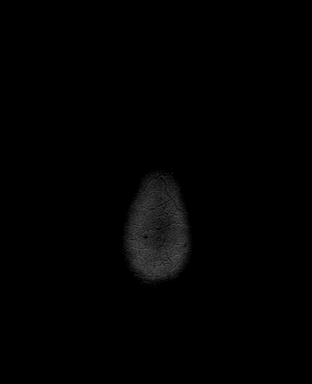

[Series 9: swi_images · axial · 3.0mm · 0.75mm/px · z∈[-29,+119]mm · 4 of 52 slices shown]
[im 1/52]
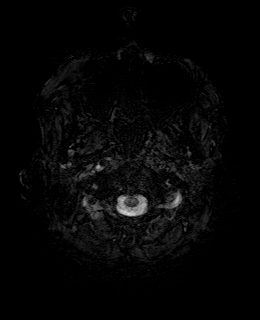
[im 18/52]
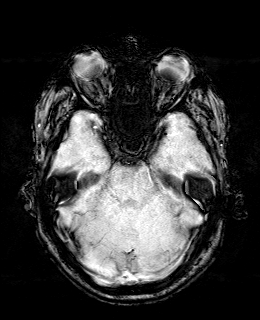
[im 35/52]
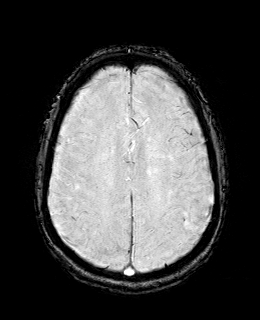
[im 52/52]
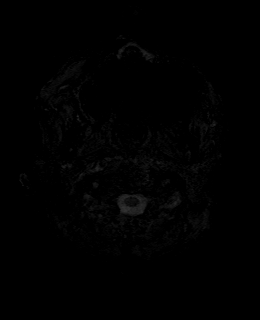

[Series 11: FLAIR · axial · 3.0mm · 0.75mm/px · z∈[-29,+119]mm · 4 of 52 slices shown]
[im 1/52]
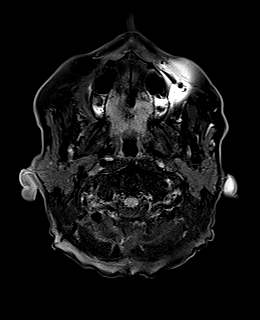
[im 18/52]
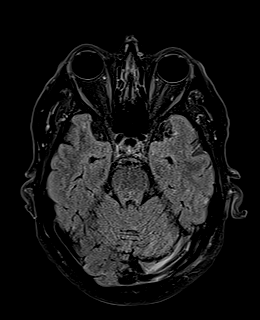
[im 35/52]
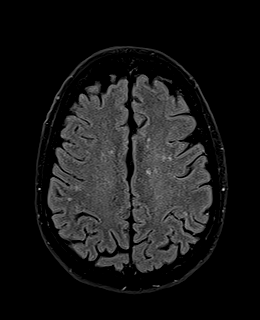
[im 52/52]
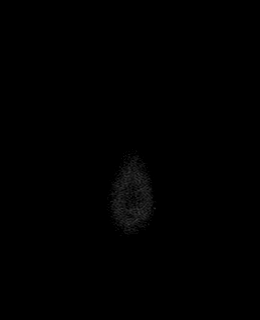

[Series 12: T1 · axial · 1.0mm · 0.94mm/px · z∈[-32,+109]mm · 12 of 144 slices shown (2 of 2)]
[im 1/144]
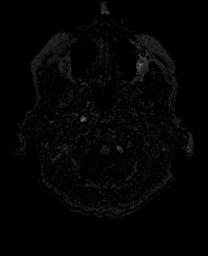
[im 14/144]
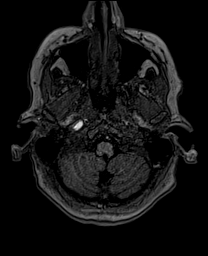
[im 27/144]
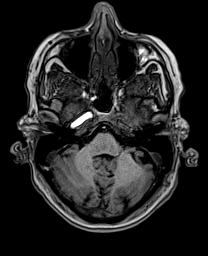
[im 40/144]
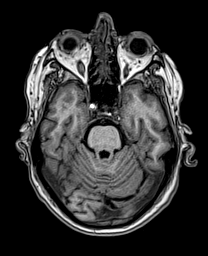
[im 53/144]
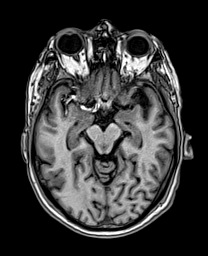
[im 66/144]
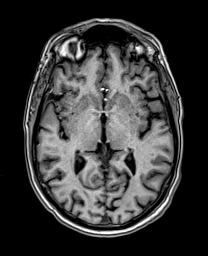
[im 79/144]
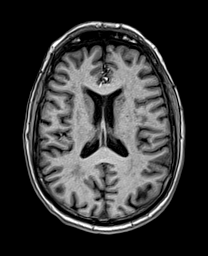
[im 92/144]
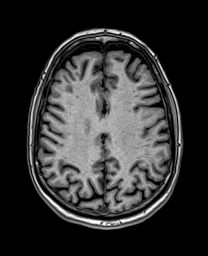
[im 105/144]
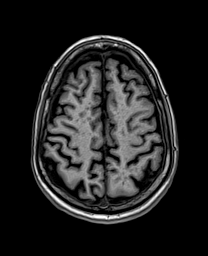
[im 118/144]
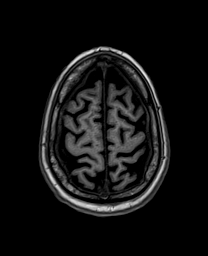
[im 131/144]
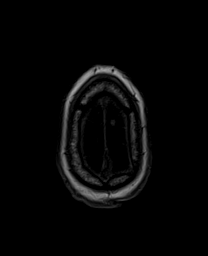
[im 144/144]
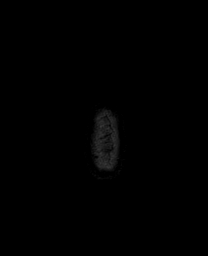

[Series 13: cor dwi_tracew · coronal · 5.0mm · 1.53mm/px · 5 of 58 slices shown]
[im 1/58]
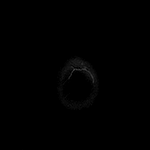
[im 15/58]
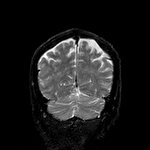
[im 29/58]
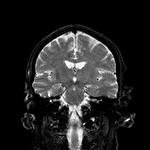
[im 43/58]
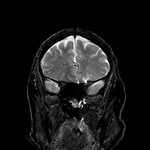
[im 58/58]
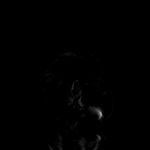

[Series 14: cor dwi_adc · coronal · 5.0mm · 1.53mm/px · 2 of 28 slices shown]
[im 1/28]
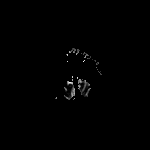
[im 28/28]
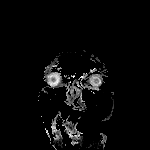

[Series 15: T2 · coronal · 5.0mm · 0.69mm/px · 3 of 29 slices shown (2 of 2)]
[im 1/29]
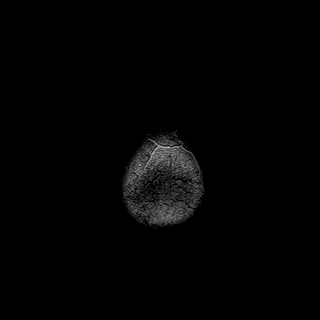
[im 15/29]
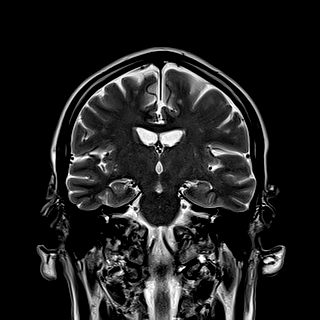
[im 29/29]
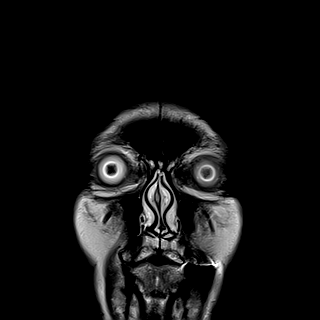

[48 of 48 positions shown; findings below may reference images not displayed]

FINDINGS: Brain: Diffusion imaging shows multiple small acute infarctions
within the left hemisphere scattered within the MCA territory which
could either be due to micro embolic infarctions or watershed
infarctions. Micro embolic infarctions are favored. No large
confluent infarction. No mass effect or swelling. Elsewhere, the
brainstem and cerebellum are normal. Cerebral hemispheres show mild
to moderate chronic small-vessel ischemic changes of the white
matter. No mass lesion, hemorrhage, hydrocephalus or extra-axial
collection.

Vascular: Major vessels at the base of the brain show flow.

Skull and upper cervical spine: Negative

Sinuses/Orbits: Clear/normal

Other: None
IMPRESSION: Numerous punctate infarctions scattered within the left MCA
territory including the cortex, subcortical white matter and basal
ganglia. This pattern could be due to micro embolic infarction or
watershed infarction. No large confluent infarction. No mass effect
or hemorrhage.

Moderate chronic small-vessel ischemic changes elsewhere affecting
the cerebral hemispheric white matter.

## 2022-01-14 MED ORDER — HYDROMORPHONE HCL 1 MG/ML IJ SOLN
1.0000 mg | INTRAMUSCULAR | Status: DC | PRN
  Administered 2022-01-14 – 2022-01-18 (×9): 1 mg via INTRAVENOUS
  Filled 2022-01-14 (×9): qty 1

## 2022-01-14 MED ORDER — ONDANSETRON HCL 4 MG/2ML IJ SOLN
4.0000 mg | Freq: Once | INTRAMUSCULAR | Status: AC
  Administered 2022-01-14: 4 mg via INTRAVENOUS
  Filled 2022-01-14: qty 2

## 2022-01-14 MED ORDER — HYDROMORPHONE HCL 1 MG/ML IJ SOLN
0.5000 mg | Freq: Once | INTRAMUSCULAR | Status: AC
  Administered 2022-01-14: 0.5 mg via INTRAVENOUS
  Filled 2022-01-14: qty 1

## 2022-01-14 MED ORDER — ASPIRIN 325 MG PO TABS
325.0000 mg | ORAL_TABLET | Freq: Every day | ORAL | Status: DC
  Administered 2022-01-14 – 2022-01-15 (×2): 325 mg via ORAL
  Filled 2022-01-14 (×2): qty 1

## 2022-01-14 MED ORDER — ACETAMINOPHEN 325 MG PO TABS: 650.0000 mg | ORAL_TABLET | ORAL | Status: DC | PRN

## 2022-01-14 MED ORDER — ACETAMINOPHEN 650 MG RE SUPP: 650.0000 mg | RECTAL | Status: DC | PRN

## 2022-01-14 MED ORDER — FOLIC ACID 1 MG PO TABS
2.0000 mg | ORAL_TABLET | Freq: Every morning | ORAL | Status: DC
  Administered 2022-01-15 – 2022-01-20 (×4): 2 mg via ORAL
  Filled 2022-01-14 (×4): qty 2

## 2022-01-14 MED ORDER — SEVELAMER CARBONATE 800 MG PO TABS
800.0000 mg | ORAL_TABLET | Freq: Three times a day (TID) | ORAL | Status: DC
  Administered 2022-01-15 – 2022-01-20 (×9): 800 mg via ORAL
  Filled 2022-01-14 (×10): qty 1

## 2022-01-14 MED ORDER — HYDROMORPHONE HCL 1 MG/ML IJ SOLN
1.0000 mg | Freq: Once | INTRAMUSCULAR | Status: AC
  Administered 2022-01-14: 1 mg via INTRAVENOUS
  Filled 2022-01-14: qty 1

## 2022-01-14 MED ORDER — DIAZEPAM 2 MG PO TABS
2.0000 mg | ORAL_TABLET | Freq: Once | ORAL | Status: AC
  Administered 2022-01-14: 2 mg via ORAL
  Filled 2022-01-14: qty 1

## 2022-01-14 MED ORDER — ACETAMINOPHEN 160 MG/5ML PO SOLN: 650.0000 mg | ORAL | Status: DC | PRN

## 2022-01-14 MED ORDER — ALPRAZOLAM 0.5 MG PO TABS
0.5000 mg | ORAL_TABLET | Freq: Every evening | ORAL | Status: DC | PRN
  Administered 2022-01-14 – 2022-01-19 (×3): 0.5 mg via ORAL
  Filled 2022-01-14 (×3): qty 1

## 2022-01-14 MED ORDER — ASPIRIN 300 MG RE SUPP: 300.0000 mg | Freq: Every day | RECTAL | Status: DC

## 2022-01-14 MED ORDER — MIRTAZAPINE 15 MG PO TABS
30.0000 mg | ORAL_TABLET | Freq: Every day | ORAL | Status: DC
  Administered 2022-01-14 – 2022-01-19 (×6): 30 mg via ORAL
  Filled 2022-01-14 (×4): qty 2
  Filled 2022-01-14: qty 1
  Filled 2022-01-14: qty 2

## 2022-01-14 MED ORDER — STROKE: EARLY STAGES OF RECOVERY BOOK
Freq: Once | Status: DC
  Filled 2022-01-14: qty 1

## 2022-01-14 MED ORDER — MIDODRINE HCL 5 MG PO TABS
5.0000 mg | ORAL_TABLET | Freq: Three times a day (TID) | ORAL | Status: DC
  Administered 2022-01-15 – 2022-01-20 (×10): 5 mg via ORAL
  Filled 2022-01-14 (×11): qty 1

## 2022-01-14 MED ORDER — OXYCODONE HCL 5 MG PO TABS
5.0000 mg | ORAL_TABLET | Freq: Four times a day (QID) | ORAL | Status: DC | PRN
  Administered 2022-01-14 – 2022-01-19 (×5): 5 mg via ORAL
  Filled 2022-01-14 (×5): qty 1

## 2022-01-14 MED ORDER — PANTOPRAZOLE SODIUM 40 MG PO TBEC
40.0000 mg | DELAYED_RELEASE_TABLET | Freq: Two times a day (BID) | ORAL | Status: DC
  Administered 2022-01-15 – 2022-01-20 (×8): 40 mg via ORAL
  Filled 2022-01-14 (×8): qty 1

## 2022-01-14 MED ORDER — ALPRAZOLAM 0.5 MG PO TABS: 0.5000 mg | ORAL_TABLET | Freq: Every day | ORAL | Status: DC | PRN

## 2022-01-14 MED ORDER — IOHEXOL 350 MG/ML SOLN
100.0000 mL | Freq: Once | INTRAVENOUS | Status: AC | PRN
  Administered 2022-01-14: 100 mL via INTRAVENOUS

## 2022-01-14 MED ORDER — TAMSULOSIN HCL 0.4 MG PO CAPS
0.4000 mg | ORAL_CAPSULE | Freq: Every morning | ORAL | Status: DC
  Administered 2022-01-15 – 2022-01-20 (×4): 0.4 mg via ORAL
  Filled 2022-01-14 (×4): qty 1

## 2022-01-14 MED ORDER — ALPRAZOLAM 0.5 MG PO TABS: 0.5000 mg | ORAL_TABLET | ORAL | Status: DC

## 2022-01-14 NOTE — Telephone Encounter (Signed)
This nurse attempted to reach patient or family related to My Chart message that was sent on Friday night at 9:51 pm, stating that patient was experiencing slurred speech and loss of function of the right hand.  The EMS was called however the patient refused to go with them.  Unable to reach anyone to check on the status of the patient.  This nurse left a message to return call to the clinic.  This nurse will continue to follow up.

## 2022-01-14 NOTE — ED Triage Notes (Signed)
Pt arrived via POV, with family, per family and pt, pt started with facial droop x2 days ago and right sided arm numbness and wkns. Denies any new decifit in the last 24 hrs.

## 2022-01-14 NOTE — ED Provider Triage Note (Signed)
Emergency Medicine Provider Triage Evaluation Note  Caleb Simmons , a 71 y.o. male  was evaluated in triage.  Pt complains of R hand weakness that family noticed this past Thursday. They also noticed right sided facial droop and slurred speech since this Saturday. No hx of stroke in the past. Last known normal Wednesday night prior to going to sleep.  Review of Systems  Positive: + r hand weakness, facial droop, speech changes Negative:   Physical Exam  BP (!) 114/95 (BP Location: Left Arm)    Pulse (!) 121    Temp 97.9 F (36.6 C) (Oral)    Resp 18    SpO2 98%  Gen:   Awake, no distress   Resp:  Normal effort  Cards:  Tachycardic MSK:   Moves extremities without difficulty  Other:  CN 2-12 intact. Strength 5/5 to Bue and BLEs. Sensation intact throughout. No facial droop appreciated.   Medical Decision Making  Medically screening exam initiated at 1:20 PM.  Appropriate orders placed.  [name] was informed that the remainder of the evaluation will be completed by another provider, this initial triage assessment does not replace that evaluation, and the importance of remaining in the ED until their evaluation is complete.  Stroke workup. Out of window.    Eustaquio Maize, PA-C 01/14/22 1323

## 2022-01-14 NOTE — ED Provider Notes (Signed)
[facility] DEPT Provider Note   CSN: [id number] Arrival date & time: 01/14/22  1309     History  Chief Complaint  Patient presents with   Facial Droop   Extremity Weakness    Caleb Simmons is a 71 y.o. male.  71 yo M with a chief complaints of facial droop and one-sided weakness.  This been going on for a couple days.  He seems more focused on his left-sided low back pain.  This is a chronic problem for him.  Seems to come and go.  Worse over the past couple weeks.  Denies trauma to the area denies loss of bowel or bladder denies loss of rectal sensation denies numbness or weakness to the leg.  He denies any neck pain.  Denies head or neck injury.  Denies abdominal pain.  Denies fever.  The history is provided by the patient.  Extremity Weakness This is a new problem. The current episode started less than 1 hour ago. The problem occurs constantly. The problem has not changed since onset.Pertinent negatives include no chest pain, no abdominal pain, no headaches and no shortness of breath. Nothing aggravates the symptoms. Nothing relieves the symptoms. He has tried nothing for the symptoms. The treatment provided no relief.      Home Medications Prior to Admission medications   Medication Sig Start Date End Date Taking? Authorizing Provider  acetaminophen (TYLENOL) 325 MG tablet Take 2 tablets (650 mg total) by mouth every 6 (six) hours as needed for moderate pain (headache). Patient taking differently: Take 650 mg by mouth every 6 (six) hours as needed for moderate pain, mild pain or headache. 09/24/21  Yes Domenic Polite, MD  ALPRAZolam Duanne Moron) 0.5 MG tablet TAKE 1 TABLET BY MOUTH 2 TIMES DAILY AS NEEDED FOR SLEEP OR ANXIETY Patient taking differently: Take 0.5 mg by mouth See admin instructions. Take 0.5 mg by mouth at bedtime and an additional 0.5 mg once a day as needed for anxiety 12/12/21  Yes Truitt Merle, MD  cyclophosphamide (CYTOXAN) 50 MG capsule  Take 9 capsules (450 mg total) by mouth once a week. Take with food to minimize GI upset. Take early in the day and maintain hydration. Patient taking differently: Take 450 mg by mouth See admin instructions. Take 450 mg by mouth every Thursday with food- to minimize GI upset. Take early in the day and maintain hydration. 11/22/21  Yes Truitt Merle, MD  folic acid (FOLVITE) 1 MG tablet TAKE 2 TABLETS BY MOUTH EVERY DAY Patient taking differently: Take 2 mg by mouth in the morning. 01/03/22  Yes Truitt Merle, MD  Melatonin 10 MG TABS Take 10 mg by mouth at bedtime.   Yes [provider]  midodrine (PROAMATINE) 5 MG tablet Take 1 tablet (5 mg total) by mouth 3 (three) times daily with meals. 10/23/21  Yes Larey Dresser, MD  mirtazapine (REMERON) 30 MG tablet Take 1 tablet (30 mg total) by mouth at bedtime. 12/27/21  Yes Pickenpack-Cousar, Carlena Sax, NP  ondansetron (ZOFRAN) 4 MG tablet Take 1 tablet (4 mg total) by mouth every 6 (six) hours as needed for nausea or vomiting. 11/08/21  Yes Truitt Merle, MD  oxyCODONE (OXY IR/ROXICODONE) 5 MG immediate release tablet Take 1 tablet (5 mg total) by mouth every 6 (six) hours as needed for severe pain. 01/03/22  Yes Pickenpack-Cousar, Athena N, NP  pantoprazole (PROTONIX) 40 MG tablet TAKE 1 TABLET BY MOUTH 2 TIMES DAILY BEFORE A meal Patient taking differently:  Take 40 mg by mouth 2 (two) times daily before a meal. 01/03/22  Yes Truitt Merle, MD  prochlorperazine (COMPAZINE) 5 MG tablet Take 1 tablet (5 mg total) by mouth every 6 (six) hours as needed for nausea or vomiting. 11/08/21  Yes Truitt Merle, MD  sevelamer carbonate (RENVELA) 800 MG tablet Take 800 mg by mouth 3 (three) times daily with meals.   Yes [provider]  tamsulosin (FLOMAX) 0.4 MG CAPS capsule TAKE 1 CAPSULE BY MOUTH EVERY DAY Patient taking differently: Take 0.4 mg by mouth in the morning. 01/03/22  Yes Truitt Merle, MD  zolpidem (AMBIEN) 5 MG tablet Take 1 tablet (5 mg total) by mouth at  bedtime as needed for sleep. Patient taking differently: Take 5 mg by mouth at bedtime. 12/27/21  Yes Pickenpack-Cousar, Carlena Sax, NP  amoxicillin-clavulanate (AUGMENTIN) 875-125 MG tablet Take 1 tablet by mouth 2 (two) times daily. Patient not taking: Reported on 01/14/2022 12/06/21   Truitt Merle, MD      Allergies    Patient has no known allergies.    Review of Systems   Review of Systems  Constitutional:  Negative for chills and fever.  HENT:  Negative for congestion and facial swelling.   Eyes:  Negative for discharge and visual disturbance.  Respiratory:  Negative for shortness of breath.   Cardiovascular:  Negative for chest pain and palpitations.  Gastrointestinal:  Negative for abdominal pain, diarrhea and vomiting.  Musculoskeletal:  Positive for back pain and extremity weakness. Negative for arthralgias and myalgias.  Skin:  Negative for color change and rash.  Neurological:  Positive for weakness. Negative for tremors, syncope and headaches.  Psychiatric/Behavioral:  Negative for confusion and dysphoric mood.    Physical Exam Updated Vital Signs BP 112/75    Pulse 72    Temp 97.9 F (36.6 C) (Oral)    Resp 12    SpO2 95%  Physical Exam Vitals and nursing note reviewed.  Constitutional:      Appearance: He is well-developed.  HENT:     Head: Normocephalic and atraumatic.  Eyes:     Pupils: Pupils are equal, round, and reactive to light.  Neck:     Vascular: No JVD.  Cardiovascular:     Rate and Rhythm: Normal rate and regular rhythm.     Heart sounds: No murmur heard.   No friction rub. No gallop.  Pulmonary:     Effort: No respiratory distress.     Breath sounds: No wheezing.  Abdominal:     General: There is no distension.     Tenderness: There is no abdominal tenderness. There is no guarding or rebound.  Musculoskeletal:        General: Normal range of motion.     Cervical back: Normal range of motion and neck supple.  Skin:    Coloration: Skin is not pale.      Findings: No rash.  Neurological:     Mental Status: He is alert and oriented to person, place, and time.     Comments: Left upper extremity compared to right 4-5 versus 5 out of 5.  Dysphagia.  Some trouble naming.  Otherwise benign neurologic exam.  Psychiatric:        Behavior: Behavior normal.    ED Results / Procedures / Treatments   Labs (all labs ordered are listed, but only abnormal results are displayed) Labs Reviewed  CBC - Abnormal; Notable for the following components:      Result Value  RBC 3.14 (*)    Hemoglobin 9.4 (*)    HCT 28.7 (*)    RDW 17.2 (*)    All other components within normal limits  COMPREHENSIVE METABOLIC PANEL - Abnormal; Notable for the following components:   Glucose, Bld 114 (*)    BUN 32 (*)    Creatinine, Ser 3.68 (*)    Calcium 8.3 (*)    Total Protein 5.9 (*)    Albumin 3.2 (*)    AST 14 (*)    GFR, Estimated 17 (*)    All other components within normal limits  RAPID URINE DRUG SCREEN, HOSP PERFORMED - Abnormal; Notable for the following components:   Benzodiazepines POSITIVE (*)    All other components within normal limits  URINALYSIS, ROUTINE W REFLEX MICROSCOPIC - Abnormal; Notable for the following components:   Color, Urine STRAW (*)    Glucose, UA 50 (*)    Protein, ur >=300 (*)    All other components within normal limits  IRON AND TIBC - Abnormal; Notable for the following components:   Saturation Ratios 47 (*)    All other components within normal limits  RETICULOCYTES - Abnormal; Notable for the following components:   RBC. 3.17 (*)    All other components within normal limits  I-STAT CHEM 8, ED - Abnormal; Notable for the following components:   BUN 30 (*)    Creatinine, Ser 3.90 (*)    Glucose, Bld 110 (*)    Calcium, Ion 1.09 (*)    Hemoglobin 9.2 (*)    HCT 27.0 (*)    All other components within normal limits  RESP PANEL BY RT-PCR (FLU A&B, COVID) ARPGX2  ETHANOL  PROTIME-INR  APTT  DIFFERENTIAL  VITAMIN  B12  FOLATE  FERRITIN  CK  HEPATIC FUNCTION PANEL  MAGNESIUM  PHOSPHORUS  HEMOGLOBIN A1C  LIPID PANEL    EKG EKG Interpretation  Date/Time:  Monday January 14 2022 14:47:21 EST Ventricular Rate:  89 PR Interval:  185 QRS Duration: 75 QT Interval:  367 QTC Calculation: 447 R Axis:   48 Text Interpretation: Sinus rhythm Anterior infarct, old No significant change since last tracing Confirmed by Aletta Edouard [phone]) on 01/14/2022 6:39:52 PM  Radiology CT Angio Head W/Cm &/Or Wo Cm  Result Date: 01/14/2022 CLINICAL DATA:  Right hand weakness, right-sided facial droop and slurred speech EXAM: CT ANGIOGRAPHY HEAD AND NECK TECHNIQUE: Multidetector CT imaging of the head and neck was performed using the standard protocol during bolus administration of intravenous contrast. Multiplanar CT image reconstructions and MIPs were obtained to evaluate the vascular anatomy. Carotid stenosis measurements (when applicable) are obtained utilizing NASCET criteria, using the distal internal carotid diameter as the denominator. RADIATION DOSE REDUCTION: This exam was performed according to the departmental dose-optimization program which includes automated exposure control, adjustment of the mA and/or kV according to patient size and/or use of iterative reconstruction technique. CONTRAST:  150mL OMNIPAQUE IOHEXOL 350 MG/ML SOLN COMPARISON:  01/14/2022 CT head and MRI FINDINGS: CT HEAD FINDINGS Brain: No evidence of acute hemorrhage, cerebral edema, mass, mass effect, or midline shift. No hydrocephalus or extra-axial fluid collection. Periventricular white matter changes, likely the sequela of chronic small vessel ischemic disease. The acute infarcts seen on the same-day MRI are not significantly apparent on this CT. Vascular: No hyperdense vessel. Skull: Normal. Negative for fracture or focal lesion. Sinuses/Orbits: No acute finding. Other: The mastoid air cells are well aerated. CTA NECK FINDINGS Aortic arch:  Standard branching. Imaged portion shows  no evidence of aneurysm or dissection. No significant stenosis of the major arch vessel origins. Right carotid system: No evidence of dissection, stenosis (50% or greater) or occlusion. Left carotid system: Evaluation of the origin of the left common carotid artery is somewhat limited due to beam hardening artifact from the adjacent venous bolus, however there appears to be mild narrowing at the origin. 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 No distal reconstitution of the extracranial ICA. Vertebral arteries: Moderate narrowing at the origin of the right vertebral artery, secondary to noncalcified plaque. Focal moderate stenosis of the right V2 (series 12, image 248) at the level of C6, as well as in the right C5-C6 foramen, secondary to degenerative changes (series 12, image 242). The right vertebral artery is otherwise patent. The left vertebral artery demonstrates mild calcified narrowing in the V2 segment, but is otherwise patent. Codominant. Skeleton: Multilevel degenerative changes in the cervical spine, with trace retrolisthesis of C5 on C6. No acute osseous abnormality. Other neck: Negative. Upper chest: Dependent atelectasis. Review of the MIP images confirms the above findings CTA HEAD FINDINGS Evaluation is somewhat limited by degree of venous contamination. Anterior circulation: The left intracranial internal carotid artery is non-opacified until the ophthalmic and supraclinoid portions (series 12, image 111). Calcifications in the right intracranial internal carotid artery, which cause moderate narrowing in the distal cavernous segment and proximal supraclinoid segment. Occlusion of the left A1 (series 12, image 100), with a small area of filling in the distal left A1, likely retrograde. Patent right A1. Normal anterior communicating artery. Anterior cerebral arteries are patent to their  distal aspects. No M1 stenosis or occlusion. Normal MCA bifurcations. Distal MCA branches perfused and symmetric. Posterior circulation: Vertebral arteries patent to the vertebrobasilar junction, with mild calcifications and irregularity in the right V4 segment, prior to the PICA takeoff (series 12, image 145), which causes mild-to-moderate narrowing. Mild to moderate narrowing in the left V4, proximal to the left PICA takeoff (series 12, image 144). Posterior inferior cerebral arteries patent bilaterally. Basilar patent to its distal aspect. Superior cerebellar arteries patent bilaterally. Bilateral P1 segments originate from the basilar artery. PCAs perfused to their distal aspects, with mild narrowing in the left P1/P2 and poor opacification of the distal left P2 and P3 segments. The bilateral posterior communicating arteries are not definitively seen. Venous sinuses: As permitted by contrast timing, patent. Anatomic variants: None significant Review of the MIP images confirms the above findings IMPRESSION: 1. 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 The left intracranial internal carotid artery reconstitutes in the ophthalmic and supraclinoid portions. 2. Moderate narrowing in the distal right cavernous ICA and proximal supraclinoid segment. 3. Occlusion of the left A1.  The left ACA is otherwise patent. 4. Moderate stenosis at the origin of the right vertebral artery, with additional focal moderate stenosis of the right V2 at the level of C6, both secondary to atherosclerotic plaque and degenerative changes. Mild-to-moderate stenosis of the bilateral V4 segments. 5. Poor opacification of the distal left P2 and P3 segments. These results were called by telephone at the time of interpretation on 01/14/2022 at 8:48 pm to provider DR. DOUTOVA , who verbally acknowledged these results. Electronically Signed   By: Merilyn Baba M.D.   On: 01/14/2022 20:48   DG Chest 2  View  Result Date: 01/14/2022 CLINICAL DATA:  Facial droop EXAM: CHEST - 2 VIEW COMPARISON:  09/06/2021 FINDINGS:  Right dialysis catheter remains in place, unchanged. Heart and mediastinal contours are within normal limits. No focal opacities or effusions. No acute bony abnormality. Old healed left rib fractures. IMPRESSION: No active cardiopulmonary disease. Electronically Signed   By: Rolm Baptise M.D.   On: 01/14/2022 19:34   CT HEAD WO CONTRAST  Result Date: 01/14/2022 CLINICAL DATA:  Facial droop and right arm weakness and numbness for the past 2 days. EXAM: CT HEAD WITHOUT CONTRAST TECHNIQUE: Contiguous axial images were obtained from the base of the skull through the vertex without intravenous contrast. RADIATION DOSE REDUCTION: This exam was performed according to the departmental dose-optimization program which includes automated exposure control, adjustment of the mA and/or kV according to patient size and/or use of iterative reconstruction technique. COMPARISON:  None. FINDINGS: Brain: Normal size and position of the ventricles. Minimal patchy white matter low density in both cerebral hemispheres. No intracranial hemorrhage, mass lesion or CT evidence of acute infarction. Vascular: No hyperdense vessel or unexpected calcification. Skull: Normal. Negative for fracture or focal lesion. Sinuses/Orbits: Unremarkable. Other: None. IMPRESSION: 1. No acute abnormality. 2. Minimal chronic small vessel white matter ischemic changes in both cerebral hemispheres. These make it difficult to exclude a small acute subcortical infarct. If this is a continued clinical concern, this could be further evaluated with an MRI of the brain. Electronically Signed   By: Claudie Revering M.D.   On: 01/14/2022 14:17   CT Angio Neck W and/or Wo Contrast  Result Date: 01/14/2022 CLINICAL DATA:  Right hand weakness, right-sided facial droop and slurred speech EXAM: CT ANGIOGRAPHY HEAD AND NECK TECHNIQUE: Multidetector CT imaging  of the head and neck was performed using the standard protocol during bolus administration of intravenous contrast. Multiplanar CT image reconstructions and MIPs were obtained to evaluate the vascular anatomy. Carotid stenosis measurements (when applicable) are obtained utilizing NASCET criteria, using the distal internal carotid diameter as the denominator. RADIATION DOSE REDUCTION: This exam was performed according to the departmental dose-optimization program which includes automated exposure control, adjustment of the mA and/or kV according to patient size and/or use of iterative reconstruction technique. CONTRAST:  128mL OMNIPAQUE IOHEXOL 350 MG/ML SOLN COMPARISON:  01/14/2022 CT head and MRI FINDINGS: CT HEAD FINDINGS Brain: No evidence of acute hemorrhage, cerebral edema, mass, mass effect, or midline shift. No hydrocephalus or extra-axial fluid collection. Periventricular white matter changes, likely the sequela of chronic small vessel ischemic disease. The acute infarcts seen on the same-day MRI are not significantly apparent on this CT. Vascular: No hyperdense vessel. Skull: Normal. Negative for fracture or focal lesion. Sinuses/Orbits: No acute finding. Other: The mastoid air cells are well aerated. CTA NECK FINDINGS Aortic arch: Standard branching. Imaged portion shows no evidence of aneurysm or dissection. No significant stenosis of the major arch vessel origins. Right carotid system: No evidence of dissection, stenosis (50% or greater) or occlusion. Left carotid system: Evaluation of the origin of the left common carotid artery is somewhat limited due to beam hardening artifact from the adjacent venous bolus, however there appears to be mild narrowing at the origin. 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 No distal reconstitution of the extracranial ICA. Vertebral arteries: Moderate narrowing at the origin of the right vertebral  artery, secondary to noncalcified plaque. Focal moderate stenosis of the right V2 (series 12, image 248) at the level of C6, as well as in the right C5-C6 foramen, secondary to degenerative changes (series 12, image  242). The right vertebral artery is otherwise patent. The left vertebral artery demonstrates mild calcified narrowing in the V2 segment, but is otherwise patent. Codominant. Skeleton: Multilevel degenerative changes in the cervical spine, with trace retrolisthesis of C5 on C6. No acute osseous abnormality. Other neck: Negative. Upper chest: Dependent atelectasis. Review of the MIP images confirms the above findings CTA HEAD FINDINGS Evaluation is somewhat limited by degree of venous contamination. Anterior circulation: The left intracranial internal carotid artery is non-opacified until the ophthalmic and supraclinoid portions (series 12, image 111). Calcifications in the right intracranial internal carotid artery, which cause moderate narrowing in the distal cavernous segment and proximal supraclinoid segment. Occlusion of the left A1 (series 12, image 100), with a small area of filling in the distal left A1, likely retrograde. Patent right A1. Normal anterior communicating artery. Anterior cerebral arteries are patent to their distal aspects. No M1 stenosis or occlusion. Normal MCA bifurcations. Distal MCA branches perfused and symmetric. Posterior circulation: Vertebral arteries patent to the vertebrobasilar junction, with mild calcifications and irregularity in the right V4 segment, prior to the PICA takeoff (series 12, image 145), which causes mild-to-moderate narrowing. Mild to moderate narrowing in the left V4, proximal to the left PICA takeoff (series 12, image 144). Posterior inferior cerebral arteries patent bilaterally. Basilar patent to its distal aspect. Superior cerebellar arteries patent bilaterally. Bilateral P1 segments originate from the basilar artery. PCAs perfused to their distal  aspects, with mild narrowing in the left P1/P2 and poor opacification of the distal left P2 and P3 segments. The bilateral posterior communicating arteries are not definitively seen. Venous sinuses: As permitted by contrast timing, patent. Anatomic variants: None significant Review of the MIP images confirms the above findings IMPRESSION: 1. 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 The left intracranial internal carotid artery reconstitutes in the ophthalmic and supraclinoid portions. 2. Moderate narrowing in the distal right cavernous ICA and proximal supraclinoid segment. 3. Occlusion of the left A1.  The left ACA is otherwise patent. 4. Moderate stenosis at the origin of the right vertebral artery, with additional focal moderate stenosis of the right V2 at the level of C6, both secondary to atherosclerotic plaque and degenerative changes. Mild-to-moderate stenosis of the bilateral V4 segments. 5. Poor opacification of the distal left P2 and P3 segments. These results were called by telephone at the time of interpretation on 01/14/2022 at 8:48 pm to provider DR. DOUTOVA , who verbally acknowledged these results. Electronically Signed   By: Merilyn Baba M.D.   On: 01/14/2022 20:48   MR BRAIN WO CONTRAST  Result Date: 01/14/2022 CLINICAL DATA:  Neuro deficit, acute, stroke suspected. EXAM: MRI HEAD WITHOUT CONTRAST TECHNIQUE: Multiplanar, multiecho pulse sequences of the brain and surrounding structures were obtained without intravenous contrast. COMPARISON:  Head CT same day FINDINGS: Brain: Diffusion imaging shows multiple small acute infarctions within the left hemisphere scattered within the MCA territory which could either be due to micro embolic infarctions or watershed infarctions. Micro embolic infarctions are favored. No large confluent infarction. No mass effect or swelling. Elsewhere, the brainstem and cerebellum are normal. Cerebral hemispheres show mild  to moderate chronic small-vessel ischemic changes of the white matter. No mass lesion, hemorrhage, hydrocephalus or extra-axial collection. Vascular: Major vessels at the base of the brain show flow. 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 This  pattern could be due to micro embolic infarction or watershed infarction. No large confluent infarction. No mass effect or hemorrhage. Moderate chronic small-vessel ischemic changes elsewhere affecting the cerebral hemispheric white matter. Electronically Signed   By: Nelson Chimes M.D.   On: 01/14/2022 17:58   DG HIPS BILAT WITH PELVIS MIN 5 VIEWS  Result Date: 01/14/2022 CLINICAL DATA:  Bilateral hip pain EXAM: DG HIP (WITH OR WITHOUT PELVIS) 5+V BILAT COMPARISON:  None. FINDINGS: There is no evidence of hip fracture or dislocation. There is no evidence of arthropathy or other focal bone abnormality. IMPRESSION: Negative. Electronically Signed   By: Fidela Salisbury M.D.   On: 01/14/2022 21:57    Procedures Procedures    Medications Ordered in ED Medications  folic acid (FOLVITE) tablet 2 mg (2 mg Oral Given 01/15/22 0604)  mirtazapine (REMERON) tablet 30 mg (30 mg Oral Given 01/14/22 2121)  oxyCODONE (Oxy IR/ROXICODONE) immediate release tablet 5 mg (5 mg Oral Given 01/14/22 2044)  pantoprazole (PROTONIX) EC tablet 40 mg (has no administration in time range)  sevelamer carbonate (RENVELA) tablet 800 mg (has no administration in time range)  tamsulosin (FLOMAX) capsule 0.4 mg (0.4 mg Oral Given 01/15/22 0604)  midodrine (PROAMATINE) tablet 5 mg (has no administration in time range)   stroke: mapping our early stages of recovery book (0 each Does not apply Hold 01/14/22 2045)  acetaminophen (TYLENOL) tablet 650 mg (has no administration in time range)    Or  acetaminophen  (TYLENOL) 160 MG/5ML solution 650 mg (has no administration in time range)    Or  acetaminophen (TYLENOL) suppository 650 mg (has no administration in time range)  aspirin suppository 300 mg ( Rectal See Alternative 01/14/22 2041)    Or  aspirin tablet 325 mg (325 mg Oral Given 01/14/22 2041)  ALPRAZolam (XANAX) tablet 0.5 mg (0.5 mg Oral Given 01/14/22 2154)  ALPRAZolam (XANAX) tablet 0.5 mg (has no administration in time range)  HYDROmorphone (DILAUDID) injection 1 mg (1 mg Intravenous Given 01/15/22 0542)  HYDROmorphone (DILAUDID) injection 0.5 mg (0.5 mg Intravenous Given 01/14/22 1539)  ondansetron (ZOFRAN) injection 4 mg (4 mg Intravenous Given 01/14/22 1538)  diazepam (VALIUM) tablet 2 mg (2 mg Oral Given 01/14/22 1539)  HYDROmorphone (DILAUDID) injection 1 mg (1 mg Intravenous Given 01/14/22 1628)  iohexol (OMNIPAQUE) 350 MG/ML injection 100 mL (100 mLs Intravenous Contrast Given 01/14/22 1906)    ED Course/ Medical Decision Making/ A&P                           Medical Decision Making Amount and/or Complexity of Data Reviewed Radiology: ordered.  Risk Prescription drug management. Decision regarding hospitalization.   71 yo M with a chief complaints of one-sided weakness.  He tells me his right side is weak but on my exam the left side is more weak than the right.  He is also complaining of some left-sided low back pain that is a reported chronic problem for him.  He had blood work obtained in triage.  No significant electrolyte abnormality no significant anemia.  CT scan of the head without acute finding.  We will obtain an MRI of the brain.  Treat pain.  Reassess.  Patient care was signed out to Dr. Melina Copa.  Please see his note for further details care in the ED.  The patients results and plan were reviewed and discussed.   Any x-rays performed were independently reviewed by myself.   Differential diagnosis were  considered with the presenting HPI.  Medications  folic acid  (FOLVITE) tablet 2 mg (2 mg Oral Given 01/15/22 0604)  mirtazapine (REMERON) tablet 30 mg (30 mg Oral Given 01/14/22 2121)  oxyCODONE (Oxy IR/ROXICODONE) immediate release tablet 5 mg (5 mg Oral Given 01/14/22 2044)  pantoprazole (PROTONIX) EC tablet 40 mg (has no administration in time range)  sevelamer carbonate (RENVELA) tablet 800 mg (has no administration in time range)  tamsulosin (FLOMAX) capsule 0.4 mg (0.4 mg Oral Given 01/15/22 0604)  midodrine (PROAMATINE) tablet 5 mg (has no administration in time range)   stroke: mapping our early stages of recovery book (0 each Does not apply Hold 01/14/22 2045)  acetaminophen (TYLENOL) tablet 650 mg (has no administration in time range)    Or  acetaminophen (TYLENOL) 160 MG/5ML solution 650 mg (has no administration in time range)    Or  acetaminophen (TYLENOL) suppository 650 mg (has no administration in time range)  aspirin suppository 300 mg ( Rectal See Alternative 01/14/22 2041)    Or  aspirin tablet 325 mg (325 mg Oral Given 01/14/22 2041)  ALPRAZolam (XANAX) tablet 0.5 mg (0.5 mg Oral Given 01/14/22 2154)  ALPRAZolam (XANAX) tablet 0.5 mg (has no administration in time range)  HYDROmorphone (DILAUDID) injection 1 mg (1 mg Intravenous Given 01/15/22 0542)  HYDROmorphone (DILAUDID) injection 0.5 mg (0.5 mg Intravenous Given 01/14/22 1539)  ondansetron (ZOFRAN) injection 4 mg (4 mg Intravenous Given 01/14/22 1538)  diazepam (VALIUM) tablet 2 mg (2 mg Oral Given 01/14/22 1539)  HYDROmorphone (DILAUDID) injection 1 mg (1 mg Intravenous Given 01/14/22 1628)  iohexol (OMNIPAQUE) 350 MG/ML injection 100 mL (100 mLs Intravenous Contrast Given 01/14/22 1906)    Vitals:   01/15/22 0145 01/15/22 0300 01/15/22 0430 01/15/22 0700  BP: 104/76 111/84 128/84 112/75  Pulse: 70 73 78 72  Resp: 12 15 11 12   Temp:      TempSrc:      SpO2: 97% 95% 96% 95%    Final diagnoses:  Stroke (cerebrum) ([facility])  Hip pain          Final Clinical Impression(s) /  ED Diagnoses Final diagnoses:  Stroke (cerebrum) ([place])  Hip pain    Rx / DC Orders ED Discharge Orders     None         Deno Etienne, DO 01/15/22 6153

## 2022-01-14 NOTE — ED Notes (Signed)
Pt to MRI at this time.

## 2022-01-14 NOTE — ED Provider Notes (Signed)
Signout from Dr. Tyrone Nine.  71 year old male with facial droop and weakness.  Chronic low back pain.  CKD.  Anemic but stable from priors.  Plan is to follow-up on results of MRI. Physical Exam  BP 135/86    Pulse 81    Temp 97.9 F (36.6 C) (Oral)    Resp 18    SpO2 99%   Physical Exam  Procedures  Procedures  ED Course / MDM    Medical Decision Making Amount and/or Complexity of Data Reviewed Radiology: ordered.  Risk Prescription drug management. Decision regarding hospitalization.   MRI positive for stroke, question embolic versus watershed.  Discussed with Dr. Rory Percy, neuro hospitalist who is recommending admission of the patient,.  Can get CTA head and neck.  Reviewed with patient and wife.  Discussed with patient's daughter-in-law over the phone  Discussed with Dr. Joelyn Oms nephrology.  He was fine with the patient getting a CTA.  Patient just had dialysis earlier today so does not need emergent dialysis.  Dr. Joelyn Oms will follow patient at [facility].  Discussed with Dr. Roel Cluck [facility] hospitalist who will evaluate the patient for admission.  Patient in agreement with plan for admission.       Hayden Rasmussen, MD 01/15/22 [id number]

## 2022-01-14 NOTE — H&P (Incomplete)
Caleb Simmons HYQ:[id number] DOB: 07-17-1951 DOA: 01/14/2022     PCP: Merryl Hacker, No   Outpatient Specialists:   CARDS:  Dr. Aundra Dubin      Oncology  Dr. Burr Medico    Patient arrived to ER on 01/14/22 at 1309 Referred by Attending Toy Baker, MD   Patient coming from: home Lives  With family    Chief Complaint:   Chief Complaint  Patient presents with   Facial Droop   Extremity Weakness    HPI: Caleb Simmons is a 71 y.o. 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  Patient's preference was to go to [facility] where he presented with 47 days old facial droop and right arm numbness and weakness. No associated fever or chills.   Patient undergone hemodialysis today.  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   He still smokes no EtOh   Has been vaccinated against COVID  and boosted had  flu shot   Initial COVID TEST  NEGATIVE   Lab Results  Component Value Date   [place] NEGATIVE 01/14/2022   [place] NEGATIVE 08/23/2021     Regarding pertinent Chronic problems:       chronic CHF diastolic  - last echo sep 2022 Grade 2 diastolic dysfunction      ESRD on M W F  Estimated Creatinine Clearance: 17.9 mL/min (A) (by C-G formula based on SCr of 3.9 mg/dL (H)).  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  MRI showed possible numerous embolic events.  Neurology was consulted recommended transfer to [facility].  Case also discussed with nephrology who also agrees Patient will need CTA nephrology has approved    Ordered  CT HEAD   NON acute CTA head and neck -**** CXR -  NON acute    Following Medications were ordered in ER: Medications  iohexol (OMNIPAQUE) 350 MG/ML injection 100 mL (has no administration in time range)  HYDROmorphone (DILAUDID) injection 0.5 mg (0.5 mg Intravenous Given 01/14/22 1539)  ondansetron (ZOFRAN) injection 4 mg (4 mg Intravenous Given 01/14/22 1538)  diazepam (VALIUM) tablet 2 mg (2 mg Oral Given 01/14/22 1539)  HYDROmorphone (DILAUDID) injection 1 mg (1 mg Intravenous Given 01/14/22 1628)    _______________________________________________________ ER Provider Called:   Neurology Dr. Malen Gauze They Recommend admit to medicine obtain CTA Will see on arrival to [facility]  ER Provider Called:   Nephrology Dr. Joni Fears They Recommend admit to medicine okay to obtain CTA     ED Triage Vitals [01/14/22 1312]  Enc Vitals Group     BP (!) 114/95  Pulse Rate (!) 121     Resp 18     Temp 97.9 F (36.6 C)     Temp Source Oral     SpO2 98 %     Weight      Height      Head Circumference      Peak Flow      Pain Score      Pain Loc      Pain Edu?      Excl. in [place]?   ZRAQ(76)@     _________________________________________ Significant initial  Findings: Abnormal Labs Reviewed  CBC - Abnormal; Notable for the following components:      Result Value   RBC 3.14 (*)    Hemoglobin 9.4 (*)    HCT 28.7 (*)    RDW 17.2 (*)    All other components within normal limits  COMPREHENSIVE METABOLIC PANEL - Abnormal; Notable for the following components:   Glucose, Bld  114 (*)    BUN 32 (*)    Creatinine, Ser 3.68 (*)    Calcium 8.3 (*)    Total Protein 5.9 (*)    Albumin 3.2 (*)    AST 14 (*)    GFR, Estimated 17 (*)    All other components within normal limits  I-STAT CHEM 8, ED - Abnormal; Notable for the following components:   BUN 30 (*)    Creatinine, Ser 3.90 (*)    Glucose, Bld 110 (*)    Calcium, Ion 1.09 (*)    Hemoglobin 9.2 (*)    HCT 27.0 (*)    All other components within normal limits     ECG: Ordered Personally reviewed by me showing: HR : 89 Rhythm:  NSR    no evidence of ischemic changes QTC 447     The recent clinical data is shown below. Vitals:   01/14/22 1600 01/14/22 1645 01/14/22 1742 01/14/22 1823  BP: (!) 145/90 135/86 132/86 138/88  Pulse: 83 81 79 81  Resp: 16 18 12 16   Temp:      TempSrc:      SpO2: 100% 99% 100% 100%     WBC     Component Value Date/Time   WBC 6.6 01/14/2022 1342   LYMPHSABS 1.3 01/14/2022 1342   MONOABS 0.5 01/14/2022 1342   EOSABS 0.1 01/14/2022 1342   BASOSABS 0.1 01/14/2022 1342       Results for orders placed or performed during the hospital encounter of 01/14/22  Resp Panel by RT-PCR (Flu A&B, Covid) Nasopharyngeal Swab     Status: None   Collection Time: 01/14/22  1:43 PM   Specimen: Nasopharyngeal Swab; Nasopharyngeal(NP) swabs in vial transport medium  Result Value Ref Range Status   SARS Coronavirus 2 by RT PCR NEGATIVE NEGATIVE Final         Influenza A by PCR NEGATIVE NEGATIVE Final   Influenza B by PCR NEGATIVE NEGATIVE Final           _______________________________________________ Hospitalist was called for admission for CVA  The following Work up has been ordered so far:  Orders Placed This Encounter  Procedures   Resp Panel by RT-PCR (Flu A&B, Covid) Nasopharyngeal Swab   CT HEAD WO CONTRAST   MR BRAIN WO CONTRAST   CT Angio Head W/Cm &/Or Wo Cm   CT Angio Neck W and/or Wo Contrast   DG Chest Port 1 View   Ethanol   Protime-INR   APTT   CBC  Differential   Comprehensive metabolic panel   Urine rapid drug screen (hosp performed)   Urinalysis, Routine w reflex microscopic   Diet NPO time specified   Vital signs   Cardiac Monitoring   Modified Stroke Scale (mNIHSS) Document mNIHSS assessment every 2 hours for a total of 12 hours   Stroke swallow screen   Initiate Carrier Fluid Protocol   If O2 sat If O2 Sat < 94%, administer O2 at 2 liters/minute via nasal cannula.   Cardiac monitoring   Consult to hospitalist   Consult to nephrology   Pulse oximetry, continuous   I-stat chem 8, ED   ED EKG   Saline lock IV   Admit to Inpatient (patient's expected length of stay will be greater than 2 midnights or inpatient only procedure)     OTHER Significant initial  Findings:  labs showing:    Recent Labs  Lab 01/10/22 0744 01/14/22 1342 01/14/22 1349  NA 138 136 135  K 4.6 4.5 4.5  CO2 23 23  --   GLUCOSE 125* 114* 110*  BUN 33* 32* 30*  CREATININE 4.27* 3.68* 3.90*  CALCIUM 8.6* 8.3*  --     Cr  stable,    Lab Results  Component Value Date   CREATININE 3.90 (H) 01/14/2022   CREATININE 3.68 (H) 01/14/2022   CREATININE 4.27 (HH) 01/10/2022    Recent Labs  Lab 01/10/22 0744 01/14/22 1342  AST 9* 14*  ALT 7 12  ALKPHOS 95 86  BILITOT 0.3 0.6  PROT 5.7* 5.9*  ALBUMIN 3.4* 3.2*   Lab Results  Component Value Date   CALCIUM 8.3 (L) 01/14/2022   PHOS 5.2 (H) 09/24/2021       Plt: Lab Results  Component Value Date   PLT 205 01/14/2022     COVID-19 Labs  No results for input(s): DDIMER, FERRITIN, LDH, CRP in the last 72 hours.  Lab Results  Component Value Date   SARSCOV2NAA NEGATIVE 01/14/2022   [place] NEGATIVE 08/23/2021        Recent Labs  Lab 01/10/22 0744 01/14/22 1342 01/14/22 1349  WBC 11.4* 6.6  --   NEUTROABS 8.7* 4.6  --   HGB 9.1* 9.4* 9.2*  HCT 28.1* 28.7* 27.0*  MCV 90.9 91.4  --   PLT 167 205  --     HG/HCT  stable,      Component Value Date/Time   HGB 9.2  (L) 01/14/2022 1349   HGB 9.1 (L) 09/27/2021 0733   HCT 27.0 (L) 01/14/2022 1349   MCV 91.4 01/14/2022 1342      Cardiac Panel (last 3 results) No results for input(s): CKTOTAL, CKMB, TROPONINI, RELINDX in the last 72 hours.  .car BNP (last 3 results) Recent Labs    08/23/21 1901 09/09/21 1514  BNP 1,300.0* 2,567.7*        Cultures:    Component Value Date/Time   SDES BLOOD RIGHT HAND 09/08/2021 0531   SPECREQUEST AEROBIC BOTTLE ONLY Blood Culture adequate volume 09/08/2021 0531   CULT  09/08/2021 0531    NO GROWTH 5 DAYS Performed at [facility], [place] [address] [place], [place] [address]    REPTSTATUS 09/13/2021 FINAL 09/08/2021 0531     Radiological Exams on Admission: CT HEAD WO CONTRAST  Result Date: 01/14/2022 CLINICAL DATA:  Facial droop and right arm weakness and numbness for the past 2 days. EXAM: CT HEAD WITHOUT CONTRAST TECHNIQUE: Contiguous axial images were obtained from the base of the skull through  the vertex without intravenous contrast. RADIATION DOSE REDUCTION: This exam was performed according to the departmental dose-optimization program which includes automated exposure control, adjustment of the mA and/or kV according to patient size and/or use of iterative reconstruction technique. COMPARISON:  None. FINDINGS: Brain: Normal size and position of the ventricles. Minimal patchy white matter low density in both cerebral hemispheres. No intracranial hemorrhage, mass lesion or CT evidence of acute infarction. Vascular: No hyperdense vessel or unexpected calcification. Skull: Normal. Negative for fracture or focal lesion. Sinuses/Orbits: Unremarkable. Other: None. IMPRESSION: 1. No acute abnormality. 2. Minimal chronic small vessel white matter ischemic changes in both cerebral hemispheres. These make it difficult to exclude a small acute subcortical infarct. If this is a continued clinical concern, this could be further evaluated with an MRI of the brain.  Electronically Signed   By: Claudie Revering M.D.   On: 01/14/2022 14:17   MR BRAIN WO CONTRAST  Result Date: 01/14/2022 CLINICAL DATA:  Neuro deficit, acute, stroke suspected. EXAM: MRI HEAD WITHOUT CONTRAST TECHNIQUE: Multiplanar, multiecho pulse sequences of the brain and surrounding structures were obtained without intravenous contrast. COMPARISON:  Head CT same day FINDINGS: Brain: Diffusion imaging shows multiple small acute infarctions within the left hemisphere scattered within the MCA territory which could either be due to micro embolic infarctions or watershed infarctions. Micro embolic infarctions are favored. No large confluent infarction. No mass effect or swelling. Elsewhere, the brainstem and cerebellum are normal. Cerebral hemispheres show mild to moderate chronic small-vessel ischemic changes of the white matter. No mass lesion, hemorrhage, hydrocephalus or extra-axial collection. Vascular: Major vessels at the base of the brain show flow. 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 This pattern could be due to micro embolic infarction or watershed infarction. No large confluent infarction. No mass effect or hemorrhage. Moderate chronic small-vessel ischemic changes elsewhere affecting the cerebral hemispheric white matter. Electronically Signed   By: Nelson Chimes M.D.   On: 01/14/2022 17:58   _______________________________________________________________________________________________________ Latest  Blood pressure 138/88, pulse 81, temperature 97.9 F (36.6 C), temperature source Oral, resp. rate 16, SpO2 100 %.   Vitals  labs and radiology finding personally reviewed  Review of Systems:    Pertinent positives include:    localizing neurological complaints Constitutional:  No weight loss, night sweats,  Fevers, chills, fatigue, weight loss  HEENT:  No headaches, Difficulty swallowing,Tooth/dental problems,Sore throat,  No sneezing, itching, ear ache, nasal congestion, post nasal drip,  Cardio-vascular:  No chest pain, Orthopnea, PND, anasarca, dizziness, palpitations.no Bilateral lower extremity swelling  GI:  No heartburn, indigestion, abdominal pain, nausea, vomiting, diarrhea, change in bowel habits, loss of appetite, melena, blood in stool, hematemesis Resp:  no shortness of breath at rest. No dyspnea on exertion, No excess mucus, no productive cough, No non-productive cough, No coughing up of blood.No change in color of mucus.No wheezing. Skin:  no rash or lesions. No jaundice GU:  no dysuria, change in color of urine, no urgency or frequency. No straining to urinate.  No flank pain.  Musculoskeletal:  No joint pain or no joint swelling. No decreased range of motion. No back pain.  Psych:  No change in mood or affect. No depression or anxiety. No memory loss.  Neuro: no, no tingling, no weakness, no double vision, no gait abnormality, no slurred speech, no confusion  All systems reviewed and apart from [facility]  all are negative _______________________________________________________________________________________________ Past Medical History:  History reviewed. No pertinent past medical history.    Past Surgical History:  Procedure Laterality Date   APPENDECTOMY     IR EMBO ART  VEN HEMORR LYMPH EXTRAV  INC GUIDE ROADMAPPING  08/30/2021   IR FLUORO GUIDE CV LINE RIGHT  08/30/2021   IR FLUORO GUIDE CV LINE RIGHT  09/18/2021   IR RENAL SELECTIVE  UNI INC S&I MOD SED  08/31/2021   IR US GUIDE VASC ACCESS RIGHT  08/30/2021   IR US GUIDE VASC ACCESS RIGHT  08/30/2021   IR US GUIDE VASC ACCESS RIGHT  09/18/2021    Social History:  Ambulatory   independently      reports that he has been smoking cigarettes. He has a 6.25 pack-year smoking history. He has never used smokeless tobacco. No  history on file for alcohol use and drug use.     Family History:no hx of CVA or MI   History reviewed. No pertinent family history. ______________________________________________________________________________________________ Allergies: No Known Allergies   Prior to Admission medications   Medication Sig Start Date End Date Taking? Authorizing Provider  acetaminophen (TYLENOL) 325 MG tablet Take 2 tablets (650 mg total) by mouth every 6 (six) hours as needed for moderate pain (headache). 09/24/21   Domenic Polite, MD  ALPRAZolam Duanne Moron) 0.5 MG tablet TAKE 1 TABLET BY MOUTH 2 TIMES DAILY AS NEEDED FOR SLEEP OR ANXIETY 12/12/21   Truitt Merle, MD  amoxicillin-clavulanate (AUGMENTIN) 875-125 MG tablet Take 1 tablet by mouth 2 (two) times daily. Patient not taking: Reported on 12/27/2021 12/06/21   Truitt Merle, MD  cyclophosphamide (CYTOXAN) 50 MG capsule Take 9 capsules (450 mg total) by mouth once a week. Take with food to minimize GI upset. Take early in the day and maintain hydration. 11/22/21   Truitt Merle, MD  folic acid (FOLVITE) 1 MG tablet TAKE 2 TABLETS BY MOUTH EVERY DAY 01/03/22   Truitt Merle, MD  midodrine (PROAMATINE) 5 MG tablet Take 1 tablet (5 mg total) by mouth 3 (three) times daily with meals. 10/23/21   Larey Dresser, MD  mirtazapine (REMERON) 30 MG tablet Take 1 tablet (30 mg total) by mouth at bedtime. 12/27/21   Pickenpack-Cousar, Carlena Sax, NP  ondansetron (ZOFRAN) 4 MG tablet Take 1 tablet (4 mg total) by mouth every 6 (six) hours as needed for nausea or vomiting. 11/08/21   Truitt Merle, MD  oxyCODONE (OXY IR/ROXICODONE) 5 MG immediate release tablet Take 1 tablet (5 mg total) by mouth every 6 (six) hours as needed for severe pain. 01/03/22   Pickenpack-Cousar, Carlena Sax, NP  pantoprazole (PROTONIX) 40 MG tablet TAKE 1 TABLET BY MOUTH 2 TIMES DAILY BEFORE A meal 01/03/22   Truitt Merle, MD  prochlorperazine (COMPAZINE) 5 MG tablet Take 1 tablet (5 mg total) by mouth every 6 (six) hours as  needed for nausea or vomiting. 11/08/21   Truitt Merle, MD  tamsulosin (FLOMAX) 0.4 MG CAPS capsule TAKE 1 CAPSULE BY MOUTH EVERY DAY 01/03/22   Truitt Merle, MD  zolpidem (AMBIEN) 5 MG tablet Take 1 tablet (5 mg total) by mouth at bedtime as needed for sleep. 12/27/21   Pickenpack-Cousar, Carlena Sax, NP    ___________________________________________________________________________________________________ Physical Exam: Vitals with BMI 01/14/2022 01/14/2022 01/14/2022  Height - - -  Weight - - -  BMI - - -  Systolic [phone]  Diastolic 88 86 86  Pulse 81 79 81     1. General:  in No  Acute distress    Chronically ill   -appearing 2. Psychological: Alert and   Oriented 3. Head/ENT:    Dry Mucous Membranes                          Head Non traumatic, neck supple                          Poor Dentition 4. SKIN:  decreased Skin turgor,  Skin clean Dry and intact no rash 5. Heart: Regular rate and rhythm no  Murmur, no Rub or gallop 6. Lungs:   no wheezes or crackles   7. Abdomen: Soft,  non-tender, Non distended  bowel sounds present 8. Lower extremities: no clubbing, cyanosis, no  edema 9. Neurologically  strength 5 out of 5 in all 4 extremities cranial nerves II through XII intact 10. MSK: Normal range of motion    Chart has been reviewed  ______________________________________________________________________________________________  Assessment/Plan 71 y.o. 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    Other plan as per orders.  DVT prophylaxis:  SCD      Code Status:    Code Status: Prior FULL CODE  as per patient   I had personally discussed CODE STATUS with  patient     Family Communication:   Family not at  Bedside  plan of care was discussed  with SO  Disposition Plan:    To home once workup is complete and patient is stable   Following barriers for discharge:                            Electrolytes corrected                                                          Will need consultants to evaluate patient prior to discharge                      Would benefit from PT/OT eval prior to DC  Ordered                   Swallow eval - SLP ordered                                     Transition of care consulted                   Nutrition    consulted                  Consults called:  Neurology and nephrology  Admission status:  ED Disposition     ED Disposition  [place]: [place] [[id number]]  Level of Care: Telemetry Medical [104]  May  admit patient to [facility] or [facility] if equivalent level of care is available:: No  Covid Evaluation: Confirmed COVID Negative  Diagnosis: CVA (cerebral vascular accident) [facility]) [[id number]]  Admitting Physician: Toy Baker [[id number]]  Attending Physician: Toy Baker [[id number]]  Estimated length of stay: past midnight tomorrow  Certification:: I certify this patient will need inpatient services for at least 2 midnights           inpatient     I Expect 2 midnight stay secondary to severity of patient's current illness need for inpatient interventions justified by the following:     Sev knee and extensive comorbidities including: New stroke End-stage renal disease amyloidosis  That are currently affecting medical management.   I expect  patient to be hospitalized for 2 midnights requiring inpatient medical care.  Patient is at high risk for adverse outcome (such as loss of life or disability) if not treated.  Indication for inpatient stay as follows:     Need CVA work-up    Level of care     tele indefinitely please  discontinue once patient no longer qualifies COVID-19 Labs    Lab Results  Component Value Date   [place] NEGATIVE 01/14/2022     Precautions: admitted as   Covid Negative     Vernor Monnig 01/14/2022, 7:00 PM ***  [facility]     after 2 AM please page floor coverage PA If 7AM-7PM, please contact the day team taking care of the patient using [email or web address]   Patient was evaluated in the context of the global COVID-19 pandemic, which necessitated consideration that the patient might be at risk for infection with the SARS-CoV-2 virus that causes COVID-19. Institutional protocols and algorithms that pertain to the evaluation of patients at risk for COVID-19 are in a state of rapid change based on information released by regulatory bodies including the CDC and federal and state organizations. These policies and algorithms were followed during the patient's care.

## 2022-01-14 NOTE — Subjective & Objective (Signed)
For the past 3-4 days pt started to have slurred speech, first occurring on 1/20 at about 10 pm Also noted weakness of the right hand EMS was called patient refused transfer Eventually he was rechecked by his primary care provider and was asked to go to [facility] for possible stroke evaluation.  Patient's preference was to go to [facility] where he presented with 50 days old facial droop and right arm numbness and weakness. No associated fever or chills.   Patient undergone hemodialysis today.  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

## 2022-01-15 ENCOUNTER — Other Ambulatory Visit: Payer: Self-pay

## 2022-01-15 ENCOUNTER — Inpatient Hospital Stay (HOSPITAL_COMMUNITY): Payer: Commercial Managed Care - PPO

## 2022-01-15 ENCOUNTER — Other Ambulatory Visit (HOSPITAL_COMMUNITY): Payer: Commercial Managed Care - PPO

## 2022-01-15 DIAGNOSIS — I739 Peripheral vascular disease, unspecified: Secondary | ICD-10-CM | POA: Diagnosis present

## 2022-01-15 DIAGNOSIS — Z720 Tobacco use: Secondary | ICD-10-CM | POA: Diagnosis present

## 2022-01-15 DIAGNOSIS — I63512 Cerebral infarction due to unspecified occlusion or stenosis of left middle cerebral artery: Secondary | ICD-10-CM | POA: Diagnosis not present

## 2022-01-15 DIAGNOSIS — D631 Anemia in chronic kidney disease: Secondary | ICD-10-CM | POA: Diagnosis present

## 2022-01-15 DIAGNOSIS — I639 Cerebral infarction, unspecified: Secondary | ICD-10-CM

## 2022-01-15 DIAGNOSIS — N189 Chronic kidney disease, unspecified: Secondary | ICD-10-CM | POA: Diagnosis present

## 2022-01-15 LAB — LIPID PANEL
Cholesterol: 239 mg/dL — ABNORMAL HIGH (ref 0–200)
HDL: 28 mg/dL — ABNORMAL LOW (ref >40)
LDL Cholesterol: UNDETERMINED mg/dL (ref 0–99)
Total CHOL/HDL Ratio: 8.5 RATIO
Triglycerides: 535 mg/dL — ABNORMAL HIGH (ref <150)
VLDL: UNDETERMINED mg/dL (ref 0–40)

## 2022-01-15 LAB — PHOSPHORUS: Phosphorus: 5.7 mg/dL — ABNORMAL HIGH (ref 2.5–4.6)

## 2022-01-15 LAB — HEPATIC FUNCTION PANEL
ALT: 11 U/L (ref 0–44)
AST: 11 U/L — ABNORMAL LOW (ref 15–41)
Albumin: 2.7 g/dL — ABNORMAL LOW (ref 3.5–5.0)
Alkaline Phosphatase: 72 U/L (ref 38–126)
Bilirubin, Direct: <0.1 mg/dL (ref 0.0–0.2)
Total Bilirubin: 0.3 mg/dL (ref 0.3–1.2)
Total Protein: 5 g/dL — ABNORMAL LOW (ref 6.5–8.1)

## 2022-01-15 LAB — MAGNESIUM: Magnesium: 1.9 mg/dL (ref 1.7–2.4)

## 2022-01-15 LAB — HEPATITIS B SURFACE ANTIBODY,QUALITATIVE: Hep B S Ab: NONREACTIVE

## 2022-01-15 LAB — HEPATITIS B SURFACE ANTIGEN: Hepatitis B Surface Ag: NONREACTIVE

## 2022-01-15 MED ORDER — ASPIRIN EC 81 MG PO TBEC
81.0000 mg | DELAYED_RELEASE_TABLET | Freq: Every day | ORAL | Status: DC
  Administered 2022-01-15: 17:00:00 81 mg via ORAL
  Filled 2022-01-15: qty 1

## 2022-01-15 MED ORDER — CLOPIDOGREL BISULFATE 75 MG PO TABS
75.0000 mg | ORAL_TABLET | Freq: Every day | ORAL | Status: DC
  Administered 2022-01-15 – 2022-01-17 (×2): 75 mg via ORAL
  Filled 2022-01-15 (×2): qty 1

## 2022-01-15 MED ORDER — ASPIRIN 325 MG PO TABS: 325.0000 mg | ORAL_TABLET | Freq: Once | ORAL | Status: DC

## 2022-01-15 MED ORDER — ASPIRIN EC 325 MG PO TBEC
325.0000 mg | DELAYED_RELEASE_TABLET | Freq: Every day | ORAL | Status: DC
  Administered 2022-01-17: 325 mg via ORAL
  Filled 2022-01-15: qty 1

## 2022-01-15 MED ORDER — CALCITRIOL 0.25 MCG PO CAPS: 0.2500 ug | ORAL_CAPSULE | ORAL | Status: DC

## 2022-01-15 MED ORDER — ASPIRIN 300 MG RE SUPP: 300.0000 mg | Freq: Once | RECTAL | Status: DC

## 2022-01-15 MED ORDER — CHLORHEXIDINE GLUCONATE CLOTH 2 % EX PADS
6.0000 | MEDICATED_PAD | Freq: Every day | CUTANEOUS | Status: DC
  Administered 2022-01-16 – 2022-01-19 (×2): 6 via TOPICAL

## 2022-01-15 NOTE — Assessment & Plan Note (Signed)
Restart Flomax 

## 2022-01-15 NOTE — Consult Note (Signed)
Renal Service Consult Note [facility]  Caleb Simmons 01/15/2022 Sol Blazing, MD Requesting Physician: Dr. Roel Cluck  Reason for Consult: ESRD pt w/ acute CVA HPI: The patient is a 71 y.o. 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 Symptoms had been ongoing since about 1/20. Had HD prior to arriving at ED. HD is MWF.  +tobacco, no etoh. In ED MRI showed possible numerous embolic events. Neurology consulting. Asked to see for ESRD.    Pt seen in room, working w/ PT already. Pt having difficulties giving details.  Not sure how long on HD.  Does not appear to have an AVF/ AVG.  ROS - denies CP, no joint pain, no HA, no blurry vision, no rash, no diarrhea, no nausea/ vomiting, no dysuria, no difficulty voiding   Past Medical History History reviewed. No pertinent past medical history. Past Surgical History  Past Surgical History:  Procedure Laterality Date   APPENDECTOMY     IR EMBO ART  VEN HEMORR LYMPH EXTRAV  INC GUIDE ROADMAPPING  08/30/2021   IR FLUORO GUIDE CV LINE RIGHT  08/30/2021   IR FLUORO GUIDE CV LINE RIGHT  09/18/2021   IR RENAL SELECTIVE  UNI INC S&I MOD SED  08/31/2021   IR US GUIDE VASC ACCESS RIGHT  08/30/2021   IR US GUIDE VASC ACCESS RIGHT  08/30/2021   IR US GUIDE VASC ACCESS RIGHT  09/18/2021   Family History History reviewed. No pertinent family history. Social History  reports that he has been smoking cigarettes. He has a 6.25 pack-year smoking history. He has never used smokeless tobacco. No history on file for alcohol use and drug use. Allergies No Known Allergies Home medications Prior to Admission medications   Medication Sig Start Date End Date Taking? Authorizing Provider  acetaminophen (TYLENOL) 325 MG tablet Take 2 tablets (650 mg total) by mouth every 6 (six) hours as needed for moderate pain (headache). Patient taking differently: Take 650 mg  by mouth every 6 (six) hours as needed for moderate pain, mild pain or headache. 09/24/21  Yes Domenic Polite, MD  ALPRAZolam Duanne Moron) 0.5 MG tablet TAKE 1 TABLET BY MOUTH 2 TIMES DAILY AS NEEDED FOR SLEEP OR ANXIETY Patient taking differently: Take 0.5 mg by mouth See admin instructions. Take 0.5 mg by mouth at bedtime and an additional 0.5 mg once a day as needed for anxiety 12/12/21  Yes Truitt Merle, MD  cyclophosphamide (CYTOXAN) 50 MG capsule Take 9 capsules (450 mg total) by mouth once a week. Take with food to minimize GI upset. Take early in the day and maintain hydration. Patient taking differently: Take 450 mg by mouth See admin instructions. Take 450 mg by mouth every Thursday with food- to minimize GI upset. Take early in the day and maintain hydration. 11/22/21  Yes Truitt Merle, MD  folic acid (FOLVITE) 1 MG tablet TAKE 2 TABLETS BY MOUTH EVERY DAY Patient taking differently: Take 2 mg by mouth in the morning. 01/03/22  Yes Truitt Merle, MD  Melatonin 10 MG TABS Take 10 mg by mouth at bedtime.   Yes [provider]  midodrine (PROAMATINE) 5 MG tablet Take 1 tablet (5 mg total) by mouth 3 (three) times daily with meals. 10/23/21  Yes Larey Dresser, MD  mirtazapine (REMERON) 30 MG tablet Take 1 tablet (30 mg total) by mouth at bedtime. 12/27/21  Yes Pickenpack-Cousar,  Carlena Sax, NP  ondansetron (ZOFRAN) 4 MG tablet Take 1 tablet (4 mg total) by mouth every 6 (six) hours as needed for nausea or vomiting. 11/08/21  Yes Truitt Merle, MD  oxyCODONE (OXY IR/ROXICODONE) 5 MG immediate release tablet Take 1 tablet (5 mg total) by mouth every 6 (six) hours as needed for severe pain. 01/03/22  Yes Pickenpack-Cousar, Carlena Sax, NP  pantoprazole (PROTONIX) 40 MG tablet TAKE 1 TABLET BY MOUTH 2 TIMES DAILY BEFORE A meal Patient taking differently: Take 40 mg by mouth 2 (two) times daily before a meal. 01/03/22  Yes Truitt Merle, MD  prochlorperazine (COMPAZINE) 5 MG tablet Take 1 tablet (5 mg total) by mouth every  6 (six) hours as needed for nausea or vomiting. 11/08/21  Yes Truitt Merle, MD  sevelamer carbonate (RENVELA) 800 MG tablet Take 800 mg by mouth 3 (three) times daily with meals.   Yes [provider]  tamsulosin (FLOMAX) 0.4 MG CAPS capsule TAKE 1 CAPSULE BY MOUTH EVERY DAY Patient taking differently: Take 0.4 mg by mouth in the morning. 01/03/22  Yes Truitt Merle, MD  zolpidem (AMBIEN) 5 MG tablet Take 1 tablet (5 mg total) by mouth at bedtime as needed for sleep. Patient taking differently: Take 5 mg by mouth at bedtime. 12/27/21  Yes Pickenpack-Cousar, Carlena Sax, NP  amoxicillin-clavulanate (AUGMENTIN) 875-125 MG tablet Take 1 tablet by mouth 2 (two) times daily. Patient not taking: Reported on 01/14/2022 12/06/21   Truitt Merle, MD     Vitals:   01/15/22 1330 01/15/22 1345 01/15/22 1352 01/15/22 1434  BP: 118/75 117/65 110/78 130/70  Pulse: 87 74 81 79  Resp: 17 12 (!) 24 18  Temp:    98 F (36.7 C)  TempSrc:    Oral  SpO2: 100% 99% 98% 100%   Exam Gen alert, no distress, older WM in no distress No rash, cyanosis or gangrene Sclera anicteric, throat clear  No jvd or bruits Chest clear bilat to bases, no rales/ wheezing RRR no MRG Abd soft ntnd no mass or ascites +bs GU normal MS no joint effusions or deformity Ext no LE or UE edema, no wounds or ulcers Neuro is alert, nonfocal, aphasic possibly  RIJ TDC intact    Home meds include - xanax prn, midodrine 5 tid, remeron, oxy IR prn, protonix, renvela 800 tid, flomax, ambien prn     OP HD: [facility] MWF   4h  400/1.5  76.5kg  2/2 bath  RIJ TDC  Hep none   - venofer 100 x 10 (9 left)   - calcitriol 0.25 ug po tiw      BP's 110- 130 sbp, HR 80 afeb     Na 136  K 4.5  CO2 23  BUN 32  cr 3.68  Alb 3.2  Ca 8.3  tsat 47%     Hb 9.2       CXR 1/24 - IMPRESSION: No evidence of active disease.     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 Diagnostic angiogram to be scheduled while here.  Telemetry monitoring. Neurology recs.  ESRD - on HD MWF. Has not missed HD. HD tomorrow, schedule around angiogram.  HTN/ vol - BP's low-normal, cont midodrine 5 tid. No vol excess on exam. At dry wt.   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 Tsat is good at 47%.  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 Ca in range.       Kelly Splinter  MD 01/15/2022, 4:14 PM  Recent Labs  Lab 01/10/22 0744 01/14/22 1342 01/14/22 1349  WBC 11.4* 6.6  --   HGB 9.1* 9.4* 9.2*   Recent Labs  Lab 01/10/22 0744 01/14/22 1342 01/14/22 1349  K 4.6 4.5 4.5  BUN 33* 32* 30*  CREATININE 4.27* 3.68* 3.90*  CALCIUM 8.6* 8.3*  --

## 2022-01-15 NOTE — Hospital Course (Signed)
Past medical history of amyloidosis, plasma cell myeloma, ESRD on HD MWF, tobacco use, chronic combined systolic and diastolic CHF.  Presents with complaints of facial droop and slurred speech.  Found to have acute left MCA territory infarct likely watershed infarct from hypertension in the setting of left ICA stenosis 1/23, presented with above complaint.  Work-up initiated. 1/24 transfer to [facility], neuro IR consulted.

## 2022-01-15 NOTE — Assessment & Plan Note (Signed)
Undergone recent hemodialysis.  Nephrology aware.  Continue with hemodialysis prior to scheduled

## 2022-01-15 NOTE — Assessment & Plan Note (Signed)
CTA shows complete occlusion of left ICA, narrowing of distal ICA on right, occlusion of left A1, moderate stenosis of right vertebral artery. Placing the patient at high risk of poor outcome. Neuro IR consulted.

## 2022-01-15 NOTE — Assessment & Plan Note (Signed)
with renal cardiac and neuro involvement. Outpatient follow-up with oncology.  On CyBorD and daratumumab Due for his treatment later this week.

## 2022-01-15 NOTE — Assessment & Plan Note (Signed)
Not on ESA candidate. Monitor.

## 2022-01-15 NOTE — Assessment & Plan Note (Addendum)
Followed by Dr.Feng.  We will let them know patient has been admitted

## 2022-01-15 NOTE — Consult Note (Signed)
Neurology Consultation  Reason for Consult: MRI brain with punctate left MCA territory infarctions Referring Physician: Dr. Posey Pronto  CC: Left hip pain  History is obtained from: Chart review, Patient   HPI: Caleb Simmons is a 71 y.o. male with a medical history significant for AL amyloidosis/plasma cell myeloma, ESRD on HD MWF, CHF, orthostasis, and tobacco use who presented to [facility] on 1/23 for evaluation of slurred speech and right hand weakness. Patient states thathis symptoms were first noticed on Friday night 1/20 at around 22:00 but patient initially refused transport to the hospital. On 1/23, family brought patient to the ED via [place] for further evaluation. While in the ED, an MRI brain was obtained revealing numerous punctate infarctions scattered within the left MCA territory and neurology was consulted for further evaluation. Patient states that his speech has not returned to his baseline but denies further right-sided weakness and complains of left hip pain that is chronic.   LKW: 1/20 21:50 TNK given?: no, patient presented outside of the thrombolytic therapy time window IR Thrombectomy? No, patient presented 2-3 days after onset of symptoms Modified Rankin Scale: 0-Completely asymptomatic and back to baseline post- stroke  ROS: A complete ROS was performed and is negative except as noted in the HPI.   History reviewed. No pertinent past medical history. Amyloidosis ESRD on HD MWF Orthostatic hypotension Anemia CHF Tobacco use  History reviewed. No pertinent family history.  Social History:   reports that he has been smoking cigarettes. He has a 6.25 pack-year smoking history. He has never used smokeless tobacco. No history on file for alcohol use and drug use.  Medications  Current Facility-Administered Medications:     stroke: mapping our early stages of recovery book, , Does not apply, Once, Doutova, Anastassia, MD   acetaminophen (TYLENOL) tablet 650 mg, 650 mg, Oral,  Q4H PRN **OR** acetaminophen (TYLENOL) 160 MG/5ML solution 650 mg, 650 mg, Per Tube, Q4H PRN **OR** acetaminophen (TYLENOL) suppository 650 mg, 650 mg, Rectal, Q4H PRN, Doutova, Anastassia, MD   ALPRAZolam Duanne Moron) tablet 0.5 mg, 0.5 mg, Oral, QHS PRN, Roel Cluck, Anastassia, MD, 0.5 mg at 01/14/22 2154   ALPRAZolam (XANAX) tablet 0.5 mg, 0.5 mg, Oral, Daily PRN, Roel Cluck, Anastassia, MD   aspirin suppository 300 mg, 300 mg, Rectal, Daily **OR** aspirin tablet 325 mg, 325 mg, Oral, Daily, Doutova, Anastassia, MD, 325 mg at 57/32/20 2542   folic acid (FOLVITE) tablet 2 mg, 2 mg, Oral, q AM, Roel Cluck, Anastassia, MD, 2 mg at 01/15/22 0604   HYDROmorphone (DILAUDID) injection 1 mg, 1 mg, Intravenous, Q4H PRN, Roel Cluck, Anastassia, MD, 1 mg at 01/15/22 0542   midodrine (PROAMATINE) tablet 5 mg, 5 mg, Oral, TID WC, Doutova, Anastassia, MD   mirtazapine (REMERON) tablet 30 mg, 30 mg, Oral, QHS, Doutova, Anastassia, MD, 30 mg at 01/14/22 2121   oxyCODONE (Oxy IR/ROXICODONE) immediate release tablet 5 mg, 5 mg, Oral, Q6H PRN, Roel Cluck, Anastassia, MD, 5 mg at 01/14/22 2044   pantoprazole (PROTONIX) EC tablet 40 mg, 40 mg, Oral, BID AC, Doutova, Anastassia, MD   sevelamer carbonate (RENVELA) tablet 800 mg, 800 mg, Oral, TID WC, Doutova, Anastassia, MD   tamsulosin (FLOMAX) capsule 0.4 mg, 0.4 mg, Oral, q AM, Doutova, Anastassia, MD, 0.4 mg at 01/15/22 0604  Current Outpatient Medications:    acetaminophen (TYLENOL) 325 MG tablet, Take 2 tablets (650 mg total) by mouth every 6 (six) hours as needed for moderate pain (headache). (Patient taking differently: Take 650 mg by mouth every 6 (six) hours  as needed for moderate pain, mild pain or headache.), Disp: , Rfl:    ALPRAZolam (XANAX) 0.5 MG tablet, TAKE 1 TABLET BY MOUTH 2 TIMES DAILY AS NEEDED FOR SLEEP OR ANXIETY (Patient taking differently: Take 0.5 mg by mouth See admin instructions. Take 0.5 mg by mouth at bedtime and an additional 0.5 mg once a day as needed for  anxiety), Disp: 60 tablet, Rfl: 0   cyclophosphamide (CYTOXAN) 50 MG capsule, Take 9 capsules (450 mg total) by mouth once a week. Take with food to minimize GI upset. Take early in the day and maintain hydration. (Patient taking differently: Take 450 mg by mouth See admin instructions. Take 450 mg by mouth every Thursday with food- to minimize GI upset. Take early in the day and maintain hydration.), Disp: 36 capsule, Rfl: 1   folic acid (FOLVITE) 1 MG tablet, TAKE 2 TABLETS BY MOUTH EVERY DAY (Patient taking differently: Take 2 mg by mouth in the morning.), Disp: 60 tablet, Rfl: 0   Melatonin 10 MG TABS, Take 10 mg by mouth at bedtime., Disp: , Rfl:    midodrine (PROAMATINE) 5 MG tablet, Take 1 tablet (5 mg total) by mouth 3 (three) times daily with meals., Disp: 270 tablet, Rfl: 3   mirtazapine (REMERON) 30 MG tablet, Take 1 tablet (30 mg total) by mouth at bedtime., Disp: 30 tablet, Rfl: 1   ondansetron (ZOFRAN) 4 MG tablet, Take 1 tablet (4 mg total) by mouth every 6 (six) hours as needed for nausea or vomiting., Disp: 30 tablet, Rfl: 2   oxyCODONE (OXY IR/ROXICODONE) 5 MG immediate release tablet, Take 1 tablet (5 mg total) by mouth every 6 (six) hours as needed for severe pain., Disp: 60 tablet, Rfl: 0   pantoprazole (PROTONIX) 40 MG tablet, TAKE 1 TABLET BY MOUTH 2 TIMES DAILY BEFORE A meal (Patient taking differently: Take 40 mg by mouth 2 (two) times daily before a meal.), Disp: 60 tablet, Rfl: 0   prochlorperazine (COMPAZINE) 5 MG tablet, Take 1 tablet (5 mg total) by mouth every 6 (six) hours as needed for nausea or vomiting., Disp: 30 tablet, Rfl: 2   sevelamer carbonate (RENVELA) 800 MG tablet, Take 800 mg by mouth 3 (three) times daily with meals., Disp: , Rfl:    tamsulosin (FLOMAX) 0.4 MG CAPS capsule, TAKE 1 CAPSULE BY MOUTH EVERY DAY (Patient taking differently: Take 0.4 mg by mouth in the morning.), Disp: 30 capsule, Rfl: 0   zolpidem (AMBIEN) 5 MG tablet, Take 1 tablet (5 mg total)  by mouth at bedtime as needed for sleep. (Patient taking differently: Take 5 mg by mouth at bedtime.), Disp: 30 tablet, Rfl: 0   amoxicillin-clavulanate (AUGMENTIN) 875-125 MG tablet, Take 1 tablet by mouth 2 (two) times daily. (Patient not taking: Reported on 01/14/2022), Disp: 14 tablet, Rfl: 0  Facility-Administered Medications Ordered in Other Encounters:    heparin lock flush 100 unit/mL, 500 Units, Intracatheter, Once, Truitt Merle, MD   sodium chloride flush (NS) 0.9 % injection 10 mL, 10 mL, Intracatheter, Once, Truitt Merle, MD  Exam: Current vital signs: BP 109/81    Pulse 77    Temp 97.9 F (36.6 C) (Oral)    Resp (!) 21    SpO2 97%  Vital signs in last 24 hours: Temp:  [97.9 F (36.6 C)] 97.9 F (36.6 C) (01/23 1312) Pulse Rate:  [70-121] 77 (01/24 0815) Resp:  [10-24] 21 (01/24 0815) BP: (101-160)/(61-97) 109/81 (01/24 0815) SpO2:  [94 %-100 %] 97 % (  01/24 0815)  GENERAL: Awake, alert, in no acute distress Psych: Affect appropriate for situation, patient is calm and cooperative with examination Head: Normocephalic and atraumatic, without obvious abnormality EENT: Normal conjunctivae, dry mucous membranes, no OP obstruction LUNGS: Normal respiratory effort. Non-labored breathing on room air CV: Regular rate and rhythm on telemetry ABDOMEN: Soft, non-tender, non-distended Extremities: Warm, well perfused, without obvious deformity  NEURO:  Mental Status: Awake, alert, and oriented to person, place, time, and situation. Word finding difficulty on long sentences Speech/Language: speech is mildly dysarthric.   Naming, repetition, fluency, and comprehension intact without aphasia. No neglect is noted Cranial Nerves:  II: PERRL. Visual fields full.  III, IV, VI: EOMI without ptosis or nystagmus  V: Sensation is intact to light touch and symmetrical to face.  VII: Face is symmetric resting and smiling.  VIII: Hearing is intact to voice IX, X: Palate elevation is symmetric.  Phonation normal.  XI: Normal sternocleidomastoid and trapezius muscle strength XII: Tongue protrudes midline without fasciculations.   Motor: Patient has subtle right-sided weakness on exam though both upper extremities elevate antigravity without vertical drift.  Patient elevates bilateral lower extremities antigravity without vertical drift and without obvious asymmetry.  Tone is normal. Bulk is normal.  Sensation: Intact to light touch bilaterally in all four extremities. No extinction to DSS present.  Coordination: FTN intact bilaterally. HKS intact bilaterally. No pronator drift.  Gait: Deferred  NIHSS: 1a Level of Conscious.: 0 1b LOC Questions: 0 1c LOC Commands: 0 2 Best Gaze: 0 3 Visual: 0 4 Facial Palsy: 0 5a Motor Arm - left: 0 5b Motor Arm - Right: 0 6a Motor Leg - Left: 0 6b Motor Leg - Right: 0 7 Limb Ataxia: 0 8 Sensory: 0 9 Best Language: 1 10 Dysarthria: 1 11 Extinct. and Inatten.: 0 TOTAL: 2  Labs I have reviewed labs in epic and the results pertinent to this consultation are: CBC    Component Value Date/Time   WBC 6.6 01/14/2022 1342   RBC 3.14 (L) 01/14/2022 1342   RBC 3.17 (L) 01/14/2022 1342   HGB 9.2 (L) 01/14/2022 1349   HGB 9.1 (L) 09/27/2021 0733   HCT 27.0 (L) 01/14/2022 1349   PLT 205 01/14/2022 1342   PLT 223 09/27/2021 0733   MCV 91.4 01/14/2022 1342   MCH 29.9 01/14/2022 1342   MCHC 32.8 01/14/2022 1342   RDW 17.2 (H) 01/14/2022 1342   LYMPHSABS 1.3 01/14/2022 1342   MONOABS 0.5 01/14/2022 1342   EOSABS 0.1 01/14/2022 1342   BASOSABS 0.1 01/14/2022 1342   CMP     Component Value Date/Time   NA 135 01/14/2022 1349   K 4.5 01/14/2022 1349   CL 104 01/14/2022 1349   CO2 23 01/14/2022 1342   GLUCOSE 110 (H) 01/14/2022 1349   BUN 30 (H) 01/14/2022 1349   CREATININE 3.90 (H) 01/14/2022 1349   CREATININE 3.38 (HH) 10/04/2021 1201   CALCIUM 8.3 (L) 01/14/2022 1342   PROT 5.9 (L) 01/14/2022 1342   ALBUMIN 3.2 (L) 01/14/2022 1342    AST 14 (L) 01/14/2022 1342   AST 10 (L) 10/04/2021 1201   ALT 12 01/14/2022 1342   ALT 8 10/04/2021 1201   ALKPHOS 86 01/14/2022 1342   BILITOT 0.6 01/14/2022 1342   BILITOT 0.4 10/04/2021 1201   GFRNONAA 17 (L) 01/14/2022 1342   GFRNONAA 19 (L) 10/04/2021 1201   Imaging I have reviewed the images obtained:  MRI examination of the brain 1/23: - Numerous punctate infarctions  scattered within the left MCA territory including the cortex, subcortical white matter and basal ganglia. This pattern could be due to micro embolic infarction or watershed infarction. No large confluent infarction. No mass effect or hemorrhage. - Moderate chronic small-vessel ischemic changes elsewhere affecting the cerebral hemispheric white matter.  CT angio head and neck 1/23: 1. 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 The left intracranial internal carotid artery reconstitutes in the ophthalmic and supraclinoid portions. 2. Moderate narrowing in the distal right cavernous ICA and proximal supraclinoid segment. 3. Occlusion of the left A1.  The left ACA is otherwise patent. 4. Moderate stenosis at the origin of the right vertebral artery, with additional focal moderate stenosis of the right V2 at the level of C6, both secondary to atherosclerotic plaque and degenerative changes. Mild-to-moderate stenosis of the bilateral V4 segments. 5. Poor opacification of the distal left P2 and P3 segments.  Assessment: 71 y.o. male with history as above who presented to the ED 1/23 for 2 days of dysarthria, facial droop, and right hand weakness with MRI brain findings revealing numerous punctate infarctions scattered within the left MCA territory.  - Examination reveals patient with mild dysarthria and subtle right upper extremity weakness compared to the left without vertical drift on assessment. He denies any sensory changes.  - Imaging as above.  - Etiology of infarctions is  likely hypoperfusion causing watershed strokes 2/2 to occlusion of the left internal carotid artery identified on CTA and orthostasis.  - Stroke risk factors include tobacco use, patient's advanced age, and orthostatic hypotension.  Impression: Left MCA infarctions concerning for watershed versus microembolic etiology Left ICA occlusion Left A1 occlusion   Recommendations: - HgbA1c, fasting lipid panel for LDL goal of < 70 - Frequent neuro checks - Echocardiogram pending - Diagnostic cerebral angiogram for further evaluation of ICA occlusion - Dr. Estanislado Pandy aware. Likely Wednesday AM. NPO orders placed. IR team will place pre procedure orders - Prophylactic therapy- Antiplatelet med: ASA 81+ Plavix 75 - Risk factor modification - Telemetry monitoring - PT consult, OT consult, Speech consult - Stroke team to follow  Pt seen by NP/Neuro and later by MD. Note/plan to be edited by MD as needed.  Anibal Henderson, AGAC-NP [facility] Pager: [phone]   Attending Neurohospitalist Addendum Patient seen and examined with APP/Resident. Agree with the history and physical as documented above. Agree with the plan as documented, which I helped formulate. I have independently reviewed the chart, obtained history, review of systems and examined the patient.I have personally reviewed pertinent head/neck/spine imaging (CT/MRI). Case discussed for diagnostic angio with Dr. Estanislado Pandy. Please feel free to call with any questions.  -- Amie Portland, MD Neurologist [facility] Pager: [phone]

## 2022-01-15 NOTE — ED Notes (Signed)
[facility] at bedside 

## 2022-01-15 NOTE — Consult Note (Signed)
Chief Complaint: Patient was seen in consultation today for  Chief Complaint  Patient presents with   Facial Droop   Extremity Weakness    Referring Physician(s): Dr. Rory Percy    Supervising Physician: Luanne Bras  Patient Status: [facility] - In-pt  History of Present Illness: Caleb Simmons is a 71 y.o. Simmons with a medical history significant for AL amyloidosis/plasma cell myeloma, ESRD on hemodialysis and CHF who presented to the [facility] ED 01/14/22 for evaluation of slurred speech and right handed weakness. The symptoms had been ongoing since Friday evening. MRI brain revealed numerous punctate infarctions scattered within the left MCA territory. Thrombolytics were not administered due to the patient presenting outside the therapy time window. Additional imaging ordered.   CTA head/neck 01/14/22 IMPRESSION: 1. 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 The left intracranial internal carotid artery reconstitutes in the ophthalmic and supraclinoid portions. 2. Moderate narrowing in the distal right cavernous ICA and proximal supraclinoid segment. 3. Occlusion of the left A1.  The left ACA is otherwise patent. 4. Moderate stenosis at the origin of the right vertebral artery, with additional focal moderate stenosis of the right V2 at the level of C6, both secondary to atherosclerotic plaque and degenerative changes. Mild-to-moderate stenosis of the bilateral V4 segments. 5. Poor opacification of the distal left P2 and P3 segments.  Neuro Interventional Radiology has been requested by the Neurology team to evaluate this patient for an image-guided diagnostic cerebral angiogram for further evaluation of cerebral artery occlusions and stenosis.  Of note, the patient is familiar to IR from prior kidney biopsy, HD catheter placement and embolization of renal artery in 2022.   History reviewed. No pertinent past medical history.  Past Surgical  History:  Procedure Laterality Date   APPENDECTOMY     IR EMBO ART  VEN HEMORR LYMPH EXTRAV  INC GUIDE ROADMAPPING  08/30/2021   IR FLUORO GUIDE CV LINE RIGHT  08/30/2021   IR FLUORO GUIDE CV LINE RIGHT  09/18/2021   IR RENAL SELECTIVE  UNI INC S&I MOD SED  08/31/2021   IR US GUIDE VASC ACCESS RIGHT  08/30/2021   IR US GUIDE VASC ACCESS RIGHT  08/30/2021   IR US GUIDE VASC ACCESS RIGHT  09/18/2021    Allergies: Patient has no known allergies.  Medications: Prior to Admission medications   Medication Sig Start Date End Date Taking? Authorizing Provider  acetaminophen (TYLENOL) 325 MG tablet Take 2 tablets (650 mg total) by mouth every 6 (six) hours as needed for moderate pain (headache). Patient taking differently: Take 650 mg by mouth every 6 (six) hours as needed for moderate pain, mild pain or headache. 09/24/21  Yes Domenic Polite, MD  ALPRAZolam Duanne Moron) 0.5 MG tablet TAKE 1 TABLET BY MOUTH 2 TIMES DAILY AS NEEDED FOR SLEEP OR ANXIETY Patient taking differently: Take 0.5 mg by mouth See admin instructions. Take 0.5 mg by mouth at bedtime and an additional 0.5 mg once a day as needed for anxiety 12/12/21  Yes Truitt Merle, MD  cyclophosphamide (CYTOXAN) 50 MG capsule Take 9 capsules (450 mg total) by mouth once a week. Take with food to minimize GI upset. Take early in the day and maintain hydration. Patient taking differently: Take 450 mg by mouth See admin instructions. Take 450 mg by mouth every Thursday with food- to minimize GI upset. Take early in the day and maintain hydration. 11/22/21  Yes Truitt Merle, MD  folic acid (FOLVITE) 1 MG  tablet TAKE 2 TABLETS BY MOUTH EVERY DAY Patient taking differently: Take 2 mg by mouth in the morning. 01/03/22  Yes Truitt Merle, MD  Melatonin 10 MG TABS Take 10 mg by mouth at bedtime.   Yes [provider]  midodrine (PROAMATINE) 5 MG tablet Take 1 tablet (5 mg total) by mouth 3 (three) times daily with meals. 10/23/21  Yes Larey Dresser, MD  mirtazapine  (REMERON) 30 MG tablet Take 1 tablet (30 mg total) by mouth at bedtime. 12/27/21  Yes Pickenpack-Cousar, Carlena Sax, NP  ondansetron (ZOFRAN) 4 MG tablet Take 1 tablet (4 mg total) by mouth every 6 (six) hours as needed for nausea or vomiting. 11/08/21  Yes Truitt Merle, MD  oxyCODONE (OXY IR/ROXICODONE) 5 MG immediate release tablet Take 1 tablet (5 mg total) by mouth every 6 (six) hours as needed for severe pain. 01/03/22  Yes Pickenpack-Cousar, Carlena Sax, NP  pantoprazole (PROTONIX) 40 MG tablet TAKE 1 TABLET BY MOUTH 2 TIMES DAILY BEFORE A meal Patient taking differently: Take 40 mg by mouth 2 (two) times daily before a meal. 01/03/22  Yes Truitt Merle, MD  prochlorperazine (COMPAZINE) 5 MG tablet Take 1 tablet (5 mg total) by mouth every 6 (six) hours as needed for nausea or vomiting. 11/08/21  Yes Truitt Merle, MD  sevelamer carbonate (RENVELA) 800 MG tablet Take 800 mg by mouth 3 (three) times daily with meals.   Yes [provider]  tamsulosin (FLOMAX) 0.4 MG CAPS capsule TAKE 1 CAPSULE BY MOUTH EVERY DAY Patient taking differently: Take 0.4 mg by mouth in the morning. 01/03/22  Yes Truitt Merle, MD  zolpidem (AMBIEN) 5 MG tablet Take 1 tablet (5 mg total) by mouth at bedtime as needed for sleep. Patient taking differently: Take 5 mg by mouth at bedtime. 12/27/21  Yes Pickenpack-Cousar, Carlena Sax, NP  amoxicillin-clavulanate (AUGMENTIN) 875-125 MG tablet Take 1 tablet by mouth 2 (two) times daily. Patient not taking: Reported on 01/14/2022 12/06/21   Truitt Merle, MD     History reviewed. No pertinent family history.  Social History   Socioeconomic History   Marital status: Married    Spouse name: Not on file   Number of children: Not on file   Years of education: Not on file   Highest education level: Not on file  Occupational History   Not on file  Tobacco Use   Smoking status: Some Days    Packs/day: 0.25    Years: 25.00    Pack years: 6.25    Types: Cigarettes   Smokeless tobacco: Never   Substance and Sexual Activity   Alcohol use: Not on file   Drug use: Not on file   Sexual activity: Not on file  Other Topics Concern   Not on file  Social History Narrative   Not on file   Social Determinants of Health   Financial Resource Strain: Low Risk    Difficulty of Paying Living Expenses: Not very hard  Food Insecurity: No Food Insecurity   Worried About Running Out of Food in the Last Year: Never true   [place] in the Last Year: Never true  Transportation Needs: No Transportation Needs   Lack of Transportation (Medical): No   Lack of Transportation (Non-Medical): No  Physical Activity: Not on file  Stress: Not on file  Social Connections: Not on file    Review of Systems: A 12 point ROS discussed and pertinent positives are indicated in the HPI above.  All other systems are negative.  Review of Systems  Constitutional:  Positive for fatigue. Negative for appetite change.  Respiratory:  Negative for cough and shortness of breath.   Cardiovascular:  Negative for chest pain and leg swelling.  Gastrointestinal:  Negative for abdominal pain, diarrhea, nausea and vomiting.  Musculoskeletal:  Positive for arthralgias and myalgias.       Has left hip pain   Neurological:  Negative for dizziness and headaches.   Vital Signs: BP 130/70 (BP Location: Right Arm)    Pulse 79    Temp 98 F (36.7 C) (Oral)    Resp 18    SpO2 100%   Physical Exam Constitutional:      General: He is not in acute distress. HENT:     Mouth/Throat:     Mouth: Mucous membranes are moist.     Pharynx: Oropharynx is clear.  Cardiovascular:     Rate and Rhythm: Normal rate and regular rhythm.     Pulses: Normal pulses.     Heart sounds: Normal heart sounds.     Comments: RIJ tunneled dialysis catheter - in use.  Pulmonary:     Effort: Pulmonary effort is normal.     Breath sounds: Normal breath sounds.  Abdominal:     General: Bowel sounds are normal.     Palpations: Abdomen is  soft.  Musculoskeletal:     Right lower leg: No edema.     Left lower leg: No edema.  Skin:    General: Skin is warm and dry.  Neurological:     Mental Status: He is alert and oriented to person, place, and time.    Imaging: CT Angio Head W/Cm &/Or Wo Cm  Result Date: 01/14/2022 CLINICAL DATA:  Right hand weakness, right-sided facial droop and slurred speech EXAM: CT ANGIOGRAPHY HEAD AND NECK TECHNIQUE: Multidetector CT imaging of the head and neck was performed using the standard protocol during bolus administration of intravenous contrast. Multiplanar CT image reconstructions and MIPs were obtained to evaluate the vascular anatomy. Carotid stenosis measurements (when applicable) are obtained utilizing NASCET criteria, using the distal internal carotid diameter as the denominator. RADIATION DOSE REDUCTION: This exam was performed according to the departmental dose-optimization program which includes automated exposure control, adjustment of the mA and/or kV according to patient size and/or use of iterative reconstruction technique. CONTRAST:  12mL OMNIPAQUE IOHEXOL 350 MG/ML SOLN COMPARISON:  01/14/2022 CT head and MRI FINDINGS: CT HEAD FINDINGS Brain: No evidence of acute hemorrhage, cerebral edema, mass, mass effect, or midline shift. No hydrocephalus or extra-axial fluid collection. Periventricular white matter changes, likely the sequela of chronic small vessel ischemic disease. The acute infarcts seen on the same-day MRI are not significantly apparent on this CT. Vascular: No hyperdense vessel. Skull: Normal. Negative for fracture or focal lesion. Sinuses/Orbits: No acute finding. Other: The mastoid air cells are well aerated. CTA NECK FINDINGS Aortic arch: Standard branching. Imaged portion shows no evidence of aneurysm or dissection. No significant stenosis of the major arch vessel origins. Right carotid system: No evidence of dissection, stenosis (50% or greater) or occlusion. Left carotid  system: Evaluation of the origin of the left common carotid artery is somewhat limited due to beam hardening artifact from the adjacent venous bolus, however there appears to be mild narrowing at the origin. 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 No distal reconstitution of the extracranial ICA. Vertebral arteries: Moderate narrowing at the origin  of the right vertebral artery, secondary to noncalcified plaque. Focal moderate stenosis of the right V2 (series 12, image 248) at the level of C6, as well as in the right C5-C6 foramen, secondary to degenerative changes (series 12, image 242). The right vertebral artery is otherwise patent. The left vertebral artery demonstrates mild calcified narrowing in the V2 segment, but is otherwise patent. Codominant. Skeleton: Multilevel degenerative changes in the cervical spine, with trace retrolisthesis of C5 on C6. No acute osseous abnormality. Other neck: Negative. Upper chest: Dependent atelectasis. Review of the MIP images confirms the above findings CTA HEAD FINDINGS Evaluation is somewhat limited by degree of venous contamination. Anterior circulation: The left intracranial internal carotid artery is non-opacified until the ophthalmic and supraclinoid portions (series 12, image 111). Calcifications in the right intracranial internal carotid artery, which cause moderate narrowing in the distal cavernous segment and proximal supraclinoid segment. Occlusion of the left A1 (series 12, image 100), with a small area of filling in the distal left A1, likely retrograde. Patent right A1. Normal anterior communicating artery. Anterior cerebral arteries are patent to their distal aspects. No M1 stenosis or occlusion. Normal MCA bifurcations. Distal MCA branches perfused and symmetric. Posterior circulation: Vertebral arteries patent to the vertebrobasilar junction, with mild calcifications and irregularity  in the right V4 segment, prior to the PICA takeoff (series 12, image 145), which causes mild-to-moderate narrowing. Mild to moderate narrowing in the left V4, proximal to the left PICA takeoff (series 12, image 144). Posterior inferior cerebral arteries patent bilaterally. Basilar patent to its distal aspect. Superior cerebellar arteries patent bilaterally. Bilateral P1 segments originate from the basilar artery. PCAs perfused to their distal aspects, with mild narrowing in the left P1/P2 and poor opacification of the distal left P2 and P3 segments. The bilateral posterior communicating arteries are not definitively seen. Venous sinuses: As permitted by contrast timing, patent. Anatomic variants: None significant Review of the MIP images confirms the above findings IMPRESSION: 1. 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 The left intracranial internal carotid artery reconstitutes in the ophthalmic and supraclinoid portions. 2. Moderate narrowing in the distal right cavernous ICA and proximal supraclinoid segment. 3. Occlusion of the left A1.  The left ACA is otherwise patent. 4. Moderate stenosis at the origin of the right vertebral artery, with additional focal moderate stenosis of the right V2 at the level of C6, both secondary to atherosclerotic plaque and degenerative changes. Mild-to-moderate stenosis of the bilateral V4 segments. 5. Poor opacification of the distal left P2 and P3 segments. These results were called by telephone at the time of interpretation on 01/14/2022 at 8:48 pm to provider DR. DOUTOVA , who verbally acknowledged these results. Electronically Signed   By: Merilyn Baba M.D.   On: 01/14/2022 20:48   DG Chest 2 View  Result Date: 01/14/2022 CLINICAL DATA:  Facial droop EXAM: CHEST - 2 VIEW COMPARISON:  09/06/2021 FINDINGS: Right dialysis catheter remains in place, unchanged. Heart and mediastinal contours are within normal limits. No focal  opacities or effusions. No acute bony abnormality. Old healed left rib fractures. IMPRESSION: No active cardiopulmonary disease. Electronically Signed   By: Rolm Baptise M.D.   On: 01/14/2022 19:34   CT HEAD WO CONTRAST  Result Date: 01/14/2022 CLINICAL DATA:  Facial droop and right arm weakness and numbness for the past 2 days. EXAM: CT HEAD WITHOUT CONTRAST TECHNIQUE: Contiguous axial images were obtained from the base of the skull through the vertex without  intravenous contrast. RADIATION DOSE REDUCTION: This exam was performed according to the departmental dose-optimization program which includes automated exposure control, adjustment of the mA and/or kV according to patient size and/or use of iterative reconstruction technique. COMPARISON:  None. FINDINGS: Brain: Normal size and position of the ventricles. Minimal patchy white matter low density in both cerebral hemispheres. No intracranial hemorrhage, mass lesion or CT evidence of acute infarction. Vascular: No hyperdense vessel or unexpected calcification. Skull: Normal. Negative for fracture or focal lesion. Sinuses/Orbits: Unremarkable. Other: None. IMPRESSION: 1. No acute abnormality. 2. Minimal chronic small vessel white matter ischemic changes in both cerebral hemispheres. These make it difficult to exclude a small acute subcortical infarct. If this is a continued clinical concern, this could be further evaluated with an MRI of the brain. Electronically Signed   By: Claudie Revering M.D.   On: 01/14/2022 14:17   CT Angio Neck W and/or Wo Contrast  Result Date: 01/14/2022 CLINICAL DATA:  Right hand weakness, right-sided facial droop and slurred speech EXAM: CT ANGIOGRAPHY HEAD AND NECK TECHNIQUE: Multidetector CT imaging of the head and neck was performed using the standard protocol during bolus administration of intravenous contrast. Multiplanar CT image reconstructions and MIPs were obtained to evaluate the vascular anatomy. Carotid stenosis  measurements (when applicable) are obtained utilizing NASCET criteria, using the distal internal carotid diameter as the denominator. RADIATION DOSE REDUCTION: This exam was performed according to the departmental dose-optimization program which includes automated exposure control, adjustment of the mA and/or kV according to patient size and/or use of iterative reconstruction technique. CONTRAST:  171mL OMNIPAQUE IOHEXOL 350 MG/ML SOLN COMPARISON:  01/14/2022 CT head and MRI FINDINGS: CT HEAD FINDINGS Brain: No evidence of acute hemorrhage, cerebral edema, mass, mass effect, or midline shift. No hydrocephalus or extra-axial fluid collection. Periventricular white matter changes, likely the sequela of chronic small vessel ischemic disease. The acute infarcts seen on the same-day MRI are not significantly apparent on this CT. Vascular: No hyperdense vessel. Skull: Normal. Negative for fracture or focal lesion. Sinuses/Orbits: No acute finding. Other: The mastoid air cells are well aerated. CTA NECK FINDINGS Aortic arch: Standard branching. Imaged portion shows no evidence of aneurysm or dissection. No significant stenosis of the major arch vessel origins. Right carotid system: No evidence of dissection, stenosis (50% or greater) or occlusion. Left carotid system: Evaluation of the origin of the left common carotid artery is somewhat limited due to beam hardening artifact from the adjacent venous bolus, however there appears to be mild narrowing at the origin. 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 No distal reconstitution of the extracranial ICA. Vertebral arteries: Moderate narrowing at the origin of the right vertebral artery, secondary to noncalcified plaque. Focal moderate stenosis of the right V2 (series 12, image 248) at the level of C6, as well as in the right C5-C6 foramen, secondary to degenerative changes (series 12, image 242). The  right vertebral artery is otherwise patent. The left vertebral artery demonstrates mild calcified narrowing in the V2 segment, but is otherwise patent. Codominant. Skeleton: Multilevel degenerative changes in the cervical spine, with trace retrolisthesis of C5 on C6. No acute osseous abnormality. Other neck: Negative. Upper chest: Dependent atelectasis. Review of the MIP images confirms the above findings CTA HEAD FINDINGS Evaluation is somewhat limited by degree of venous contamination. Anterior circulation: The left intracranial internal carotid artery is non-opacified until the ophthalmic and supraclinoid portions (series 12, image 111). Calcifications in the  right intracranial internal carotid artery, which cause moderate narrowing in the distal cavernous segment and proximal supraclinoid segment. Occlusion of the left A1 (series 12, image 100), with a small area of filling in the distal left A1, likely retrograde. Patent right A1. Normal anterior communicating artery. Anterior cerebral arteries are patent to their distal aspects. No M1 stenosis or occlusion. Normal MCA bifurcations. Distal MCA branches perfused and symmetric. Posterior circulation: Vertebral arteries patent to the vertebrobasilar junction, with mild calcifications and irregularity in the right V4 segment, prior to the PICA takeoff (series 12, image 145), which causes mild-to-moderate narrowing. Mild to moderate narrowing in the left V4, proximal to the left PICA takeoff (series 12, image 144). Posterior inferior cerebral arteries patent bilaterally. Basilar patent to its distal aspect. Superior cerebellar arteries patent bilaterally. Bilateral P1 segments originate from the basilar artery. PCAs perfused to their distal aspects, with mild narrowing in the left P1/P2 and poor opacification of the distal left P2 and P3 segments. The bilateral posterior communicating arteries are not definitively seen. Venous sinuses: As permitted by contrast  timing, patent. Anatomic variants: None significant Review of the MIP images confirms the above findings IMPRESSION: 1. 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 The left intracranial internal carotid artery reconstitutes in the ophthalmic and supraclinoid portions. 2. Moderate narrowing in the distal right cavernous ICA and proximal supraclinoid segment. 3. Occlusion of the left A1.  The left ACA is otherwise patent. 4. Moderate stenosis at the origin of the right vertebral artery, with additional focal moderate stenosis of the right V2 at the level of C6, both secondary to atherosclerotic plaque and degenerative changes. Mild-to-moderate stenosis of the bilateral V4 segments. 5. Poor opacification of the distal left P2 and P3 segments. These results were called by telephone at the time of interpretation on 01/14/2022 at 8:48 pm to provider DR. DOUTOVA , who verbally acknowledged these results. Electronically Signed   By: Merilyn Baba M.D.   On: 01/14/2022 20:48   MR BRAIN WO CONTRAST  Result Date: 01/14/2022 CLINICAL DATA:  Neuro deficit, acute, stroke suspected. EXAM: MRI HEAD WITHOUT CONTRAST TECHNIQUE: Multiplanar, multiecho pulse sequences of the brain and surrounding structures were obtained without intravenous contrast. COMPARISON:  Head CT same day FINDINGS: Brain: Diffusion imaging shows multiple small acute infarctions within the left hemisphere scattered within the MCA territory which could either be due to micro embolic infarctions or watershed infarctions. Micro embolic infarctions are favored. No large confluent infarction. No mass effect or swelling. Elsewhere, the brainstem and cerebellum are normal. Cerebral hemispheres show mild to moderate chronic small-vessel ischemic changes of the white matter. No mass lesion, hemorrhage, hydrocephalus or extra-axial collection. Vascular: Major vessels at the base of the brain show flow. Skull and upper  cervical spine: Negative Sinuses/Orbits: Clear/normal Other: None IMPRESSION: Numerous punctate infarctions scattered within the left MCA territory including the cortex, subcortical white matter and basal ganglia. This pattern could be due to micro embolic infarction or watershed infarction. No large confluent infarction. No mass effect or hemorrhage. Moderate chronic small-vessel ischemic changes elsewhere affecting the cerebral hemispheric white matter. Electronically Signed   By: Nelson Chimes M.D.   On: 01/14/2022 17:58   DG Chest Port 1 View  Result Date: 01/15/2022 CLINICAL DATA:  Weakness. EXAM: PORTABLE CHEST 1 VIEW COMPARISON:  09/06/2021 FINDINGS: Dialysis catheter with tip at the upper cavoatrial junction. There is no edema, consolidation, effusion, or pneumothorax. Artifact from EKG leads. Normal heart size and  mediastinal contours. IMPRESSION: No evidence of active disease. Electronically Signed   By: Jorje Guild M.D.   On: 01/15/2022 07:43   DG HIPS BILAT WITH PELVIS MIN 5 VIEWS  Result Date: 01/14/2022 CLINICAL DATA:  Bilateral hip pain EXAM: DG HIP (WITH OR WITHOUT PELVIS) 5+V BILAT COMPARISON:  None. FINDINGS: There is no evidence of hip fracture or dislocation. There is no evidence of arthropathy or other focal bone abnormality. IMPRESSION: Negative. Electronically Signed   By: Fidela Salisbury M.D.   On: 01/14/2022 21:57    Labs:  CBC: Recent Labs    01/03/22 0802 01/10/22 0744 01/14/22 1342 01/14/22 1349 01/16/22 0355  WBC 11.6* 11.4* 6.6  --  6.7  HGB 10.1* 9.1* 9.4* 9.2* 8.5*  HCT 30.9* 28.1* 28.7* 27.0* 25.7*  PLT 167 167 205  --  219    COAGS: Recent Labs    08/29/21 0637 08/30/21 0241 01/14/22 1342 01/16/22 0355  INR 1.7* 1.5* 1.0 1.0  APTT  --   --  27  --     BMP: Recent Labs    01/03/22 0802 01/10/22 0744 01/14/22 1342 01/14/22 1349 01/16/22 0355  NA 139 138 136 135 133*  K 4.3 4.6 4.5 4.5 5.2*  CL 104 106 105 104 104  CO2 27 23 23   --  19*   GLUCOSE 116* 125* 114* 110* 88  BUN 24* 33* Caleb* 30* 38*  CALCIUM 8.5* 8.6* 8.3*  --  8.5*  CREATININE 3.44* 4.27* 3.68* 3.90* 4.73*  GFRNONAA 18* 14* 17*  --  12*    LIVER FUNCTION TESTS: Recent Labs    01/03/22 0802 01/10/22 0744 01/14/22 1342 01/15/22 1514  BILITOT 0.2* 0.3 0.6 0.3  AST 8* 9* 14* 11*  ALT 6 7 12 11   ALKPHOS 84 95 86 72  PROT 5.6* 5.7* 5.9* 5.0*  ALBUMIN 3.3* 3.4* 3.2* 2.7*    TUMOR MARKERS: No results for input(s): AFPTM, CEA, CA199, CHROMGRNA in the last 8760 hours.  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  Risks and benefits of this procedure were discussed with the patient including, but not limited to bleeding, infection, vascular injury or contrast induced renal failure.  This interventional procedure involves the use of X-rays and because of the nature of the planned procedure, it is possible that we will have prolonged use of X-ray fluoroscopy.  Potential radiation risks to you include (but are not limited to) the following: - A slightly elevated risk for cancer  several years later in life. This risk is typically less than 0.5% percent. This risk is low in comparison to the normal incidence of human cancer, which is 33% for women and 50% for men according to the [employer]. - Radiation induced injury can include skin redness, resembling a rash, tissue breakdown / ulcers and hair loss (which can be temporary or permanent).   The likelihood of either of these occurring depends on the difficulty of the procedure and whether you are sensitive to radiation due to previous procedures, disease, or genetic conditions.   IF your procedure requires a prolonged use of radiation, you will be notified and given written instructions for  further action.  It is your responsibility to monitor the irradiated area for the 2 weeks following the procedure and to notify your physician if you are concerned that  you have suffered a radiation induced injury.    All of the patient's questions were answered, patient is agreeable to proceed. He has been NPO.   Consent signed and in chart.  Thank you for this interesting consult.  I greatly enjoyed meeting RISHAB STOUDT and look forward to participating in their care.  A copy of this report was sent to the requesting provider on this date.  Electronically Signed: Soyla Dryer, AGACNP-BC [phone] 01/16/2022, 8:21 AM   I spent a total of 20 Minutes    in face to face in clinical consultation, greater than 50% of which was counseling/coordinating care for diagnostic cerebral angiogram.

## 2022-01-15 NOTE — Assessment & Plan Note (Signed)
Presents with dysarthria and facial droop as well as right-sided weakness. 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 Occlusion of left A1, moderate stenosis of right vertebral artery. Neurology consulted. Transfer to [place] and speech consulted as well. Currently on aspirin and Plavix. Neuro IR also consulted.  Patient will undergo cerebral angiogram.  Most likely Wednesday.  Currently n.p.o. after midnight. Echocardiogram pending.  A1c pending.  Lipid panel pending

## 2022-01-15 NOTE — Evaluation (Signed)
Occupational Therapy Evaluation Patient Details Name: Caleb Simmons MRN: [id number] DOB: 18-Dec-1951 Today's Date: 01/15/2022   History of Present Illness Patient is a 71 year old male admitted with slurred speech and right side weakness. + for Numerous punctate infarctions scattered within the left MCA  territory including the cortex, subcortical white matter and basal ganglia. 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   Clinical Impression   Patient lives at home, caregiver friend has moved in after patient's spouse passed away. At baseline patient does not use an adaptive device and typically does not need assistance for self care tasks. Patient reports main symptom is difficulty with word finding, denies weakness or parasthesia. MMT of bilateral upper extremities WFL. Patient able to perform ADL tasks, functional transfers and ambulation in room without physical assistance.  No further acute OT needs at this time, will sign off.     Recommendations for follow up therapy are one component of a multi-disciplinary discharge planning process, led by the attending physician.  Recommendations may be updated based on patient status, additional functional criteria and insurance authorization.   Follow Up Recommendations  No OT follow up    Assistance Recommended at Discharge PRN     Functional Status Assessment  Patient has not had a recent decline in their functional status  Equipment Recommendations  None recommended by OT    Recommendations for Other Services Speech consult     Precautions / Restrictions Restrictions Weight Bearing Restrictions: No      Mobility Bed Mobility Overal bed mobility: Modified Independent                  Transfers Overall transfer level: Independent Equipment used: None                      Balance Overall balance assessment: Mild deficits observed, not formally tested                                          ADL either performed or assessed with clinical judgement   ADL Overall ADL's : Independent                                       General ADL Comments: Patient is able to perform self care tasks, functional ambulation and transfers without physical assistance.      Pertinent Vitals/Pain Pain Assessment Pain Assessment: 0-10 Pain Score: 9  Pain Location: L hip Pain Descriptors / Indicators: Aching Pain Intervention(s): RN gave pain meds during session     Hand Dominance Right   Extremity/Trunk Assessment Upper Extremity Assessment Upper Extremity Assessment: Overall WFL for tasks assessed   Lower Extremity Assessment Lower Extremity Assessment: Defer to PT evaluation   Cervical / Trunk Assessment Cervical / Trunk Assessment: Normal   Communication Communication Communication: Expressive difficulties (word finding difficulties)   Cognition Arousal/Alertness: Awake/alert Behavior During Therapy: WFL for tasks assessed/performed Overall Cognitive Status: Within Functional Limits for tasks assessed                                                  Home Living Family/patient expects to  be discharged to:: Private residence Living Arrangements: Non-relatives/Friends Available Help at Discharge: Available 24 hours/day;Friend(s) Type of Home: House Home Access: Stairs to enter [employer] of Steps: 3-4   Home Layout: One level     Bathroom Shower/Tub: Teacher, early years/pre: [place]: Conservation officer, nature (2 wheels);Shower seat          Prior Functioning/Environment Prior Level of Function : Independent/Modified Independent                        OT Problem List: Decreased cognition         OT Goals(Current goals can be found in the care plan section) Acute Rehab OT Goals Patient Stated Goal: "my hip hurts" OT Goal Formulation: All assessment and education  complete, DC therapy   AM-PAC OT "6 Clicks" Daily Activity     Outcome Measure Help from another person eating meals?: None Help from another person taking care of personal grooming?: None Help from another person toileting, which includes using toliet, bedpan, or urinal?: None Help from another person bathing (including washing, rinsing, drying)?: None Help from another person to put on and taking off regular upper body clothing?: None Help from another person to put on and taking off regular lower body clothing?: None 6 Click Score: 24   End of Session Nurse Communication: Mobility status;Patient requests pain meds  Activity Tolerance: Patient tolerated treatment well Patient left: in bed;with call bell/phone within reach;with family/visitor present  OT Visit Diagnosis: Other symptoms and signs involving cognitive function                Time: 2025-4270 OT Time Calculation (min): 18 min Charges:  OT General Charges $OT Visit: 1 Visit OT Evaluation $OT Eval Low Complexity: 1 Low  Delbert Phenix OT OT pager: [phone]   Rosemary Holms 01/15/2022, 12:54 PM

## 2022-01-15 NOTE — Assessment & Plan Note (Signed)
Nephrology consult appreciated. Has history of moderate doses myeloma.  Recently placed on HD.  MWF schedule. HD per nephrology.

## 2022-01-15 NOTE — H&P (Signed)
Caleb Simmons NWG:[id number] DOB: Aug 25, 1951 DOA: 01/14/2022     PCP: Merryl Hacker, No   Outpatient Specialists:   CARDS:  Dr. Aundra Dubin      Oncology  Dr. Burr Medico    Patient arrived to ER on 01/14/22 at 1309 Referred by Attending Toy Baker, MD   Patient coming from: home Lives  With family    Chief Complaint:   Chief Complaint  Patient presents with   Facial Droop   Extremity Weakness    HPI: Caleb Simmons is a 71 y.o. 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  Patient's preference was to go to [facility] where he presented with 64 days old facial droop and right arm numbness and weakness. No associated fever or chills.   Patient undergone hemodialysis today.  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   He still smokes no EtOh  Reports acute on chronic right hip pain but also pointed to the left hip pain as well Has been vaccinated against COVID  and boosted had  flu shot   Initial COVID TEST  NEGATIVE   Lab Results  Component Value Date   [place] NEGATIVE 01/14/2022   [place] NEGATIVE 08/23/2021     Regarding pertinent Chronic problems:       chronic CHF diastolic  - last echo sep 2022 Grade 2 diastolic dysfunction      ESRD on M [facility] F  Estimated Creatinine Clearance: 17.9 mL/min (A) (by C-G formula based on SCr of 3.9 mg/dL (H)).  Lab Results  Component Value Date   CREATININE 3.90 (H) 01/14/2022   CREATININE 3.68 (H) 01/14/2022   CREATININE 4.27 (HH) 01/10/2022    Amyloidosis  On cyclophosphomide   BPH - on  Flomax   Chronic anemia - baseline hg Hemoglobin & Hematocrit  Recent Labs    01/10/22 0744 01/14/22 1342 01/14/22 1349  HGB 9.1* 9.4* 9.2*      While in ER:   Patient reported right-sided weakness but on physical examination noted to have more of a left-sided weakness.  MRI showed possible numerous embolic events.  Neurology was consulted recommended transfer to [facility].  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 Malen Gauze They Recommend admit to medicine obtain CTA Will see on arrival to [facility]  ER Provider Called:   Nephrology Dr. Joni Fears They Recommend admit to medicine okay to obtain CTA  ED Triage Vitals [01/14/22 1312]  Enc Vitals Group     BP (!) 114/95     Pulse Rate (!) 121     Resp 18     Temp 97.9 F (36.6 C)     Temp Source Oral     SpO2 98 %     Weight      Height      Head Circumference      Peak Flow      Pain Score      Pain Loc      Pain Edu?      Excl. in [place]?   HMCN(47)@     _________________________________________ Significant initial  Findings: Abnormal Labs Reviewed  CBC - Abnormal; Notable for the following components:      Result Value   RBC 3.14 (*)    Hemoglobin 9.4 (*)    HCT 28.7 (*)    RDW 17.2 (*)    All other components  within normal limits  COMPREHENSIVE METABOLIC PANEL - Abnormal; Notable for the following components:   Glucose, Bld 114 (*)    BUN 32 (*)    Creatinine, Ser 3.68 (*)    Calcium 8.3 (*)    Total Protein 5.9 (*)    Albumin 3.2 (*)    AST 14 (*)    GFR, Estimated 17 (*)    All other components within normal limits  I-STAT CHEM 8, ED - Abnormal; Notable for the following components:   BUN 30 (*)    Creatinine, Ser 3.90 (*)    Glucose, Bld 110 (*)    Calcium, Ion 1.09 (*)    Hemoglobin 9.2 (*)    HCT 27.0 (*)    All other components within normal limits     ECG: Ordered Personally reviewed by me showing: HR : 89 Rhythm:  NSR    no evidence of ischemic changes QTC 447     The recent clinical data is shown below. Vitals:   01/14/22 1600 01/14/22 1645 01/14/22 1742 01/14/22 1823  BP: (!) 145/90 135/86 132/86 138/88  Pulse: 83 81 79 81  Resp: 16 18 12 16   Temp:      TempSrc:      SpO2: 100% 99% 100% 100%     WBC     Component Value Date/Time   WBC 6.6 01/14/2022 1342   LYMPHSABS 1.3 01/14/2022 1342   MONOABS 0.5 01/14/2022 1342   EOSABS 0.1 01/14/2022 1342   BASOSABS 0.1 01/14/2022 1342       Results for orders placed or performed during the hospital encounter of 01/14/22  Resp Panel by RT-PCR (Flu A&B, Covid) Nasopharyngeal Swab     Status: None   Collection Time: 01/14/22  1:43 PM   Specimen: Nasopharyngeal Swab; Nasopharyngeal(NP) swabs in vial transport medium  Result Value Ref Range Status   SARS Coronavirus 2 by RT PCR NEGATIVE NEGATIVE Final         Influenza A by PCR NEGATIVE NEGATIVE Final   Influenza B by PCR NEGATIVE NEGATIVE Final           _______________________________________________ Hospitalist was called for admission for CVA  The following Work up has been ordered so far:  Orders Placed This Encounter  Procedures   Resp Panel by RT-PCR (Flu A&B, Covid) Nasopharyngeal Swab   CT HEAD WO CONTRAST   MR BRAIN WO CONTRAST   CT Angio Head  W/Cm &/Or Wo Cm   CT Angio Neck W and/or Wo Contrast  DG Chest Port 1 View   Ethanol   Protime-INR   APTT   CBC   Differential   Comprehensive metabolic panel   Urine rapid drug screen (hosp performed)   Urinalysis, Routine w reflex microscopic   Diet NPO time specified   Vital signs   Cardiac Monitoring   Modified Stroke Scale (mNIHSS) Document mNIHSS assessment every 2 hours for a total of 12 hours   Stroke swallow screen   Initiate Carrier Fluid Protocol   If O2 sat If O2 Sat < 94%, administer O2 at 2 liters/minute via nasal cannula.   Cardiac monitoring   Consult to hospitalist   Consult to nephrology   Pulse oximetry, continuous   I-stat chem 8, ED   ED EKG   Saline lock IV   Admit to Inpatient (patient's expected length of stay will be greater than 2 midnights or inpatient only procedure)     OTHER Significant initial  Findings:  labs showing:    Recent Labs  Lab 01/10/22 0744 01/14/22 1342 01/14/22 1349  NA 138 136 135  K 4.6 4.5 4.5  CO2 23 23  --   GLUCOSE 125* 114* 110*  BUN 33* 32* 30*  CREATININE 4.27* 3.68* 3.90*  CALCIUM 8.6* 8.3*  --     Cr  stable,    Lab Results  Component Value Date   CREATININE 3.90 (H) 01/14/2022   CREATININE 3.68 (H) 01/14/2022   CREATININE 4.27 (HH) 01/10/2022    Recent Labs  Lab 01/10/22 0744 01/14/22 1342  AST 9* 14*  ALT 7 12  ALKPHOS 95 86  BILITOT 0.3 0.6  PROT 5.7* 5.9*  ALBUMIN 3.4* 3.2*   Lab Results  Component Value Date   CALCIUM 8.3 (L) 01/14/2022   PHOS 5.2 (H) 09/24/2021       Plt: Lab Results  Component Value Date   PLT 205 01/14/2022     COVID-19 Labs  No results for input(s): DDIMER, FERRITIN, LDH, CRP in the last 72 hours.  Lab Results  Component Value Date   SARSCOV2NAA NEGATIVE 01/14/2022   [place] NEGATIVE 08/23/2021        Recent Labs  Lab 01/10/22 0744 01/14/22 1342 01/14/22 1349  WBC 11.4* 6.6  --   NEUTROABS 8.7* 4.6  --   HGB 9.1* 9.4* 9.2*  HCT 28.1*  28.7* 27.0*  MCV 90.9 91.4  --   PLT 167 205  --     HG/HCT  stable,      Component Value Date/Time   HGB 9.2 (L) 01/14/2022 1349   HGB 9.1 (L) 09/27/2021 0733   HCT 27.0 (L) 01/14/2022 1349   MCV 91.4 01/14/2022 1342      Cardiac Panel (last 3 results) No results for input(s): CKTOTAL, CKMB, TROPONINI, RELINDX in the last 72 hours.  .car BNP (last 3 results) Recent Labs    08/23/21 1901 09/09/21 1514  BNP 1,300.0* 2,567.7*        Cultures:    Component Value Date/Time   SDES BLOOD RIGHT HAND 09/08/2021 0531   SPECREQUEST AEROBIC BOTTLE ONLY Blood Culture adequate volume 09/08/2021 0531   CULT  09/08/2021 0531    NO GROWTH 5 DAYS Performed at [facility], [place] [address] [place], [place] [address]    REPTSTATUS 09/13/2021 FINAL 09/08/2021 0531     Radiological Exams on Admission: CT Angio Head W/Cm &/Or Wo Cm  Result Date: 01/14/2022 CLINICAL DATA:  Right hand weakness, right-sided facial droop and slurred speech  EXAM: CT ANGIOGRAPHY HEAD AND NECK TECHNIQUE: Multidetector CT imaging of the head and neck was performed using the standard protocol during bolus administration of intravenous contrast. Multiplanar CT image reconstructions and MIPs were obtained to evaluate the vascular anatomy. Carotid stenosis measurements (when applicable) are obtained utilizing NASCET criteria, using the distal internal carotid diameter as the denominator. RADIATION DOSE REDUCTION: This exam was performed according to the departmental dose-optimization program which includes automated exposure control, adjustment of the mA and/or kV according to patient size and/or use of iterative reconstruction technique. CONTRAST:  176mL OMNIPAQUE IOHEXOL 350 MG/ML SOLN COMPARISON:  01/14/2022 CT head and MRI FINDINGS: CT HEAD FINDINGS Brain: No evidence of acute hemorrhage, cerebral edema, mass, mass effect, or midline shift. No hydrocephalus or extra-axial fluid collection. Periventricular white  matter changes, likely the sequela of chronic small vessel ischemic disease. The acute infarcts seen on the same-day MRI are not significantly apparent on this CT. Vascular: No hyperdense vessel. Skull: Normal. Negative for fracture or focal lesion. Sinuses/Orbits: No acute finding. Other: The mastoid air cells are well aerated. CTA NECK FINDINGS Aortic arch: Standard branching. Imaged portion shows no evidence of aneurysm or dissection. No significant stenosis of the major arch vessel origins. Right carotid system: No evidence of dissection, stenosis (50% or greater) or occlusion. Left carotid system: Evaluation of the origin of the left common carotid artery is somewhat limited due to beam hardening artifact from the adjacent venous bolus, however there appears to be mild narrowing at the origin. 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 No distal reconstitution of the extracranial ICA. Vertebral arteries: Moderate narrowing at the origin of the right vertebral artery, secondary to noncalcified plaque. Focal moderate stenosis of the right V2 (series 12, image 248) at the level of C6, as well as in the right C5-C6 foramen, secondary to degenerative changes (series 12, image 242). The right vertebral artery is otherwise patent. The left vertebral artery demonstrates mild calcified narrowing in the V2 segment, but is otherwise patent. Codominant. Skeleton: Multilevel degenerative changes in the cervical spine, with trace retrolisthesis of C5 on C6. No acute osseous abnormality. Other neck: Negative. Upper chest: Dependent atelectasis. Review of the MIP images confirms the above findings CTA HEAD FINDINGS Evaluation is somewhat limited by degree of venous contamination. Anterior circulation: The left intracranial internal carotid artery is non-opacified until the ophthalmic and supraclinoid portions (series 12, image 111). Calcifications in the  right intracranial internal carotid artery, which cause moderate narrowing in the distal cavernous segment and proximal supraclinoid segment. Occlusion of the left A1 (series 12, image 100), with a small area of filling in the distal left A1, likely retrograde. Patent right A1. Normal anterior communicating artery. Anterior cerebral arteries are patent to their distal aspects. No M1 stenosis or occlusion. Normal MCA bifurcations. Distal MCA branches perfused and symmetric. Posterior circulation: Vertebral arteries patent to the vertebrobasilar junction, with mild calcifications and irregularity in the right V4 segment, prior to the PICA takeoff (series 12, image 145), which causes mild-to-moderate narrowing. Mild to moderate narrowing in the left V4, proximal to the left PICA takeoff (series 12, image 144). Posterior inferior cerebral arteries patent bilaterally. Basilar patent to its distal aspect. Superior cerebellar arteries patent bilaterally. Bilateral P1 segments originate from the basilar artery. PCAs perfused to their distal aspects, with mild narrowing in the left P1/P2 and poor opacification of the distal left P2 and P3 segments. The bilateral posterior communicating arteries  are not definitively seen. Venous sinuses: As permitted by contrast timing, patent. Anatomic variants: None significant Review of the MIP images confirms the above findings IMPRESSION: 1. 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 The left intracranial internal carotid artery reconstitutes in the ophthalmic and supraclinoid portions. 2. Moderate narrowing in the distal right cavernous ICA and proximal supraclinoid segment. 3. Occlusion of the left A1.  The left ACA is otherwise patent. 4. Moderate stenosis at the origin of the right vertebral artery, with additional focal moderate stenosis of the right V2 at the level of C6, both secondary to atherosclerotic plaque and degenerative changes.  Mild-to-moderate stenosis of the bilateral V4 segments. 5. Poor opacification of the distal left P2 and P3 segments. These results were called by telephone at the time of interpretation on 01/14/2022 at 8:48 pm to provider DR. Caly Pellum , who verbally acknowledged these results. Electronically Signed   By: Merilyn Baba M.D.   On: 01/14/2022 20:48   DG Chest 2 View  Result Date: 01/14/2022 CLINICAL DATA:  Facial droop EXAM: CHEST - 2 VIEW COMPARISON:  09/06/2021 FINDINGS: Right dialysis catheter remains in place, unchanged. Heart and mediastinal contours are within normal limits. No focal opacities or effusions. No acute bony abnormality. Old healed left rib fractures. IMPRESSION: No active cardiopulmonary disease. Electronically Signed   By: Rolm Baptise M.D.   On: 01/14/2022 19:34   CT HEAD WO CONTRAST  Result Date: 01/14/2022 CLINICAL DATA:  Facial droop and right arm weakness and numbness for the past 2 days. EXAM: CT HEAD WITHOUT CONTRAST TECHNIQUE: Contiguous axial images were obtained from the base of the skull through the vertex without intravenous contrast. RADIATION DOSE REDUCTION: This exam was performed according to the departmental dose-optimization program which includes automated exposure control, adjustment of the mA and/or kV according to patient size and/or use of iterative reconstruction technique. COMPARISON:  None. FINDINGS: Brain: Normal size and position of the ventricles. Minimal patchy white matter low density in both cerebral hemispheres. No intracranial hemorrhage, mass lesion or CT evidence of acute infarction. Vascular: No hyperdense vessel or unexpected calcification. Skull: Normal. Negative for fracture or focal lesion. Sinuses/Orbits: Unremarkable. Other: None. IMPRESSION: 1. No acute abnormality. 2. Minimal chronic small vessel white matter ischemic changes in both cerebral hemispheres. These make it difficult to exclude a small acute subcortical infarct. If this is a continued  clinical concern, this could be further evaluated with an MRI of the brain. Electronically Signed   By: Claudie Revering M.D.   On: 01/14/2022 14:17   CT Angio Neck W and/or Wo Contrast  Result Date: 01/14/2022 CLINICAL DATA:  Right hand weakness, right-sided facial droop and slurred speech EXAM: CT ANGIOGRAPHY HEAD AND NECK TECHNIQUE: Multidetector CT imaging of the head and neck was performed using the standard protocol during bolus administration of intravenous contrast. Multiplanar CT image reconstructions and MIPs were obtained to evaluate the vascular anatomy. Carotid stenosis measurements (when applicable) are obtained utilizing NASCET criteria, using the distal internal carotid diameter as the denominator. RADIATION DOSE REDUCTION: This exam was performed according to the departmental dose-optimization program which includes automated exposure control, adjustment of the mA and/or kV according to patient size and/or use of iterative reconstruction technique. CONTRAST:  158mL OMNIPAQUE IOHEXOL 350 MG/ML SOLN COMPARISON:  01/14/2022 CT head and MRI FINDINGS: CT HEAD FINDINGS Brain: No evidence of acute hemorrhage, cerebral edema, mass, mass effect, or midline shift. No hydrocephalus or extra-axial fluid collection. Periventricular white matter  changes, likely the sequela of chronic small vessel ischemic disease. The acute infarcts seen on the same-day MRI are not significantly apparent on this CT. Vascular: No hyperdense vessel. Skull: Normal. Negative for fracture or focal lesion. Sinuses/Orbits: No acute finding. Other: The mastoid air cells are well aerated. CTA NECK FINDINGS Aortic arch: Standard branching. Imaged portion shows no evidence of aneurysm or dissection. No significant stenosis of the major arch vessel origins. Right carotid system: No evidence of dissection, stenosis (50% or greater) or occlusion. Left carotid system: Evaluation of the origin of the left common carotid artery is somewhat  limited due to beam hardening artifact from the adjacent venous bolus, however there appears to be mild narrowing at the origin. 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 No distal reconstitution of the extracranial ICA. Vertebral arteries: Moderate narrowing at the origin of the right vertebral artery, secondary to noncalcified plaque. Focal moderate stenosis of the right V2 (series 12, image 248) at the level of C6, as well as in the right C5-C6 foramen, secondary to degenerative changes (series 12, image 242). The right vertebral artery is otherwise patent. The left vertebral artery demonstrates mild calcified narrowing in the V2 segment, but is otherwise patent. Codominant. Skeleton: Multilevel degenerative changes in the cervical spine, with trace retrolisthesis of C5 on C6. No acute osseous abnormality. Other neck: Negative. Upper chest: Dependent atelectasis. Review of the MIP images confirms the above findings CTA HEAD FINDINGS Evaluation is somewhat limited by degree of venous contamination. Anterior circulation: The left intracranial internal carotid artery is non-opacified until the ophthalmic and supraclinoid portions (series 12, image 111). Calcifications in the right intracranial internal carotid artery, which cause moderate narrowing in the distal cavernous segment and proximal supraclinoid segment. Occlusion of the left A1 (series 12, image 100), with a small area of filling in the distal left A1, likely retrograde. Patent right A1. Normal anterior communicating artery. Anterior cerebral arteries are patent to their distal aspects. No M1 stenosis or occlusion. Normal MCA bifurcations. Distal MCA branches perfused and symmetric. Posterior circulation: Vertebral arteries patent to the vertebrobasilar junction, with mild calcifications and irregularity in the right V4 segment, prior to the PICA takeoff (series 12, image 145), which  causes mild-to-moderate narrowing. Mild to moderate narrowing in the left V4, proximal to the left PICA takeoff (series 12, image 144). Posterior inferior cerebral arteries patent bilaterally. Basilar patent to its distal aspect. Superior cerebellar arteries patent bilaterally. Bilateral P1 segments originate from the basilar artery. PCAs perfused to their distal aspects, with mild narrowing in the left P1/P2 and poor opacification of the distal left P2 and P3 segments. The bilateral posterior communicating arteries are not definitively seen. Venous sinuses: As permitted by contrast timing, patent. Anatomic variants: None significant Review of the MIP images confirms the above findings IMPRESSION: 1. 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 The left intracranial internal carotid artery reconstitutes in the ophthalmic and supraclinoid portions. 2. Moderate narrowing in the distal right cavernous ICA and proximal supraclinoid segment. 3. Occlusion of the left A1.  The left ACA is otherwise patent. 4. Moderate stenosis at the origin of the right vertebral artery, with additional focal moderate stenosis of the right V2 at the level of C6, both secondary to atherosclerotic plaque and degenerative changes. Mild-to-moderate stenosis of the bilateral V4 segments. 5. Poor opacification of the distal left P2 and P3 segments. These results were called by  telephone at the time of interpretation on 01/14/2022 at 8:48 pm to provider DR. Deneisha Dade , who verbally acknowledged these results. Electronically Signed   By: Merilyn Baba M.D.   On: 01/14/2022 20:48   MR BRAIN WO CONTRAST  Result Date: 01/14/2022 CLINICAL DATA:  Neuro deficit, acute, stroke suspected. EXAM: MRI HEAD WITHOUT CONTRAST TECHNIQUE: Multiplanar, multiecho pulse sequences of the brain and surrounding structures were obtained without intravenous contrast. COMPARISON:  Head CT same day FINDINGS: Brain: Diffusion  imaging shows multiple small acute infarctions within the left hemisphere scattered within the MCA territory which could either be due to micro embolic infarctions or watershed infarctions. Micro embolic infarctions are favored. No large confluent infarction. No mass effect or swelling. Elsewhere, the brainstem and cerebellum are normal. Cerebral hemispheres show mild to moderate chronic small-vessel ischemic changes of the white matter. No mass lesion, hemorrhage, hydrocephalus or extra-axial collection. Vascular: Major vessels at the base of the brain show flow. 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 This pattern could be due to micro embolic infarction or watershed infarction. No large confluent infarction. No mass effect or hemorrhage. Moderate chronic small-vessel ischemic changes elsewhere affecting the cerebral hemispheric white matter. Electronically Signed   By: Nelson Chimes M.D.   On: 01/14/2022 17:58   DG HIPS BILAT WITH PELVIS MIN 5 VIEWS  Result Date: 01/14/2022 CLINICAL DATA:  Bilateral hip pain EXAM: DG HIP (WITH OR WITHOUT PELVIS) 5+V BILAT COMPARISON:  None. FINDINGS: There is no evidence of hip fracture or dislocation. There is no evidence of arthropathy or other focal bone abnormality. IMPRESSION: Negative. Electronically Signed   By: Fidela Salisbury M.D.   On: 01/14/2022 21:57   _______________________________________________________________________________________________________ Latest  Blood pressure 138/88, pulse 81, temperature 97.9 F (36.6 C), temperature source Oral, resp. rate 16, SpO2 100 %.   Vitals  labs and radiology finding personally reviewed  Review of Systems:    Pertinent positives include:    localizing neurological complaints Constitutional:  No weight loss, night sweats,  Fevers, chills, fatigue, weight loss  HEENT:  No headaches, Difficulty swallowing,Tooth/dental problems,Sore throat,  No sneezing, itching, ear ache, nasal congestion, post nasal drip,  Cardio-vascular:  No chest pain, Orthopnea, PND, anasarca, dizziness, palpitations.no Bilateral lower extremity swelling  GI:  No heartburn, indigestion, abdominal pain, nausea, vomiting, diarrhea, change in bowel habits, loss of appetite, melena, blood in stool, hematemesis Resp:  no shortness of breath at rest. No dyspnea on exertion, No excess mucus, no productive cough, No non-productive cough, No coughing up of blood.No change in color of mucus.No wheezing. Skin:  no rash or lesions. No jaundice GU:  no dysuria, change in color of urine, no urgency or frequency. No straining to urinate.  No flank pain.  Musculoskeletal:  No joint pain or no joint swelling. No decreased range of motion. No back pain.  Psych:  No change in mood or affect. No depression or anxiety. No memory loss.  Neuro: no, no tingling, no weakness, no double vision, no gait abnormality, no slurred speech, no confusion  All systems reviewed and apart from [place] all are negative _______________________________________________________________________________________________ Past Medical History:  History reviewed. No pertinent past medical history.    Past Surgical History:  Procedure Laterality Date   APPENDECTOMY     IR EMBO ART  VEN HEMORR LYMPH EXTRAV  INC GUIDE ROADMAPPING  08/30/2021  IR FLUORO GUIDE CV LINE RIGHT  08/30/2021   IR FLUORO GUIDE CV LINE RIGHT  09/18/2021   IR RENAL SELECTIVE  UNI INC S&I MOD SED  08/31/2021   IR US GUIDE VASC ACCESS RIGHT  08/30/2021   IR US GUIDE VASC ACCESS RIGHT  08/30/2021   IR US GUIDE VASC ACCESS RIGHT  09/18/2021    Social History:  Ambulatory   independently      reports that he has been smoking cigarettes. He has a 6.25 pack-year smoking history. He has never used smokeless tobacco. No  history on file for alcohol use and drug use.     Family History:no hx of CVA or MI   History reviewed. No pertinent family history. ______________________________________________________________________________________________ Allergies: No Known Allergies   Prior to Admission medications   Medication Sig Start Date End Date Taking? Authorizing Provider  acetaminophen (TYLENOL) 325 MG tablet Take 2 tablets (650 mg total) by mouth every 6 (six) hours as needed for moderate pain (headache). 09/24/21   Domenic Polite, MD  ALPRAZolam Duanne Moron) 0.5 MG tablet TAKE 1 TABLET BY MOUTH 2 TIMES DAILY AS NEEDED FOR SLEEP OR ANXIETY 12/12/21   Truitt Merle, MD  amoxicillin-clavulanate (AUGMENTIN) 875-125 MG tablet Take 1 tablet by mouth 2 (two) times daily. Patient not taking: Reported on 12/27/2021 12/06/21   Truitt Merle, MD  cyclophosphamide (CYTOXAN) 50 MG capsule Take 9 capsules (450 mg total) by mouth once a week. Take with food to minimize GI upset. Take early in the day and maintain hydration. 11/22/21   Truitt Merle, MD  folic acid (FOLVITE) 1 MG tablet TAKE 2 TABLETS BY MOUTH EVERY DAY 01/03/22   Truitt Merle, MD  midodrine (PROAMATINE) 5 MG tablet Take 1 tablet (5 mg total) by mouth 3 (three) times daily with meals. 10/23/21   Larey Dresser, MD  mirtazapine (REMERON) 30 MG tablet Take 1 tablet (30 mg total) by mouth at bedtime. 12/27/21   Pickenpack-Cousar, Carlena Sax, NP  ondansetron (ZOFRAN) 4 MG tablet Take 1 tablet (4 mg total) by mouth every 6 (six) hours as needed for nausea or vomiting. 11/08/21   Truitt Merle, MD  oxyCODONE (OXY IR/ROXICODONE) 5 MG immediate release tablet Take 1 tablet (5 mg total) by mouth every 6 (six) hours as needed for severe pain. 01/03/22   Pickenpack-Cousar, Carlena Sax, NP  pantoprazole (PROTONIX) 40 MG tablet TAKE 1 TABLET BY MOUTH 2 TIMES DAILY BEFORE A meal 01/03/22   Truitt Merle, MD  prochlorperazine (COMPAZINE) 5 MG tablet Take 1 tablet (5 mg total) by mouth every 6 (six) hours as  needed for nausea or vomiting. 11/08/21   Truitt Merle, MD  tamsulosin (FLOMAX) 0.4 MG CAPS capsule TAKE 1 CAPSULE BY MOUTH EVERY DAY 01/03/22   Truitt Merle, MD  zolpidem (AMBIEN) 5 MG tablet Take 1 tablet (5 mg total) by mouth at bedtime as needed for sleep. 12/27/21   Pickenpack-Cousar, Carlena Sax, NP    ___________________________________________________________________________________________________ Physical Exam: Vitals with BMI 01/14/2022 01/14/2022 01/14/2022  Height - - -  Weight - - -  BMI - - -  Systolic 973 532 992  Diastolic 88 86 86  Pulse 81 79 81     1. General:  in No  Acute distress    Chronically ill   -appearing 2. Psychological: Alert and   Oriented 3. Head/ENT:    Dry Mucous Membranes  Head Non traumatic, neck supple                          Poor Dentition 4. SKIN:  decreased Skin turgor,  Skin clean Dry and intact no rash 5. Heart: Regular rate and rhythm no  Murmur, no Rub or gallop 6. Lungs:   no wheezes or crackles   7. Abdomen: Soft,  non-tender, Non distended  bowel sounds present 8. Lower extremities: no clubbing, cyanosis, no  edema 9. Neurologically  strength 5 out of 5 in all 4 extremities cranial nerves II through XII intact 10. MSK: Normal range of motion    Chart has been reviewed  ______________________________________________________________________________________________  Assessment/Plan 71 y.o. 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  Since event occurred days ago there is no indication for intervention at this time       Echo to evaluate for possible embolic source,        obtain cardiac enzymes,  ECG,   Lipid panel, TSH.        Order PT/OT evaluation.          Will make sure patient is on antiplatelet ASA  325 statin       Allow permissive Hypertension keep BP <220/120        Neurology consulted will see in AM  Given significant left ICA occlusion can further discuss with vascular in a.m.   ESRD (end stage renal disease) ([place]) Undergone recent hemodialysis.  Nephrology aware.  Continue with hemodialysis prior to scheduled  BPH (benign prostatic hyperplasia) Restart Flomax  Tobacco abuse  - Spoke about importance of quitting spent 5 minutes discussing options for treatment, prior attempts at quitting, and dangers of smoking  -At this point patient is  NOT  interested in quitting  - order nicotine patch   - nursing tobacco cessation protocol     Other plan as per orders.  DVT prophylaxis:  SCD     Code Status:    Code Status: Prior FULL CODE  as per patient   I had personally discussed CODE STATUS with patient     Family Communication:   Family not at  Bedside  plan of care was discussed  with SO  Disposition Plan:    To home once workup is complete and patient is stable   Following barriers for discharge:                            Electrolytes corrected  Will need consultants to evaluate patient prior to discharge                      Would benefit from PT/OT eval prior to DC  Ordered                   Swallow eval - SLP ordered                                     Transition of care  consulted                   Nutrition    consulted                  Consults called:  Neurology and nephrology  Admission status:  ED Disposition     ED Disposition  [place]: [place] [[id number]]  Level of Care: Telemetry Medical [[id number]]  May admit patient to [facility] or [facility] if equivalent level of care is available:: No  Covid Evaluation: Confirmed COVID Negative  Diagnosis: CVA (cerebral vascular accident) [facility]) [[id number]]  Admitting Physician: Toy Baker [[id number]]  Attending Physician: Toy Baker [[id number]]  Estimated length of stay: past midnight tomorrow  Certification:: I certify this patient will need inpatient services for at least 2 midnights           inpatient     I Expect 2 midnight stay secondary to severity of patient's current illness need for inpatient interventions justified by the following:     Sev knee and extensive comorbidities including: New stroke End-stage renal disease amyloidosis  That are currently affecting medical management.   I expect  patient to be hospitalized for 2 midnights requiring inpatient medical care.  Patient is at high risk for adverse outcome (such as loss of life or disability) if not treated.  Indication for inpatient stay as follows:     Need CVA work-up    Level of care     tele indefinitely please discontinue once patient no longer qualifies COVID-19 Labs    Lab Results  Component Value Date   [place] NEGATIVE 01/14/2022     Precautions: admitted as   Covid Negative     Shekinah Pitones 01/15/2022, 1:31 AM    [facility]     after 2 AM please page floor coverage PA If 7AM-7PM, please contact the day team taking care of the patient using [email or web address]   Patient was evaluated in the context of the global COVID-19 pandemic, which necessitated consideration that the patient might be at risk for infection with the SARS-CoV-2  virus that causes COVID-19. Institutional protocols and algorithms that pertain to the evaluation of patients at risk for COVID-19 are in a state of rapid change based on information released by regulatory bodies including the CDC and federal and state organizations. These policies and algorithms were followed during the patient's care.

## 2022-01-15 NOTE — Assessment & Plan Note (Signed)
No evidence of obstruction. On Flomax.  Continue.

## 2022-01-15 NOTE — Progress Notes (Addendum)
HEMATOLOGY-ONCOLOGY PROGRESS NOTE  ASSESSMENT AND PLAN: 1.  AL amyloidosis with renal, cardiac, neuro involvement 2.  Acute CVA 3.  End-stage renal disease on hemodialysis 4.  BPH 5.  Tobacco dependence 6.  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  Myeloma panel and light chains have been reviewed which are improving on treatment.  He is due for treatment later this week and we will reevaluate timing of his next treatment depending on how he recovers from this hospitalization. -Noted to have neuro changes starting last Friday.  Facial droop seems to have resolved at this point and family notices that his slurred speech has also improved.  Still has some mild right-sided weakness.  Neurology has been consulted and awaiting further recommendations.  Okay to our standpoint for anticoagulation per neurology. -Currently on hemodialysis.  Awaiting transfer to [facility] for both stroke work-up and ongoing hemodialysis while inpatient. -Hemoglobin mildly decreased but overall stable.  Monitor.  Caleb Bussing, Caleb Simmons, Caleb Simmons, Caleb Simmons  SUBJECTIVE: Caleb Simmons is followed by our office for AL amyloidosis.  Currently receiving systemic treatment with CyBorD and daratumumab.  Kappa free light chains and lambda free light chains have declined significantly while on treatment.  M spike not observed.  He developed slurred speech and right-sided weakness about 3 to 4 days prior to admission.  EMS was called to the home on Friday but the patient refused transfer to the hospital and stated that he was fine.  Due to ongoing symptoms, he presented to the emergency department on 01/14/2022.  MRI brain showed numerous punctate infarctions scattered within the left MCA territory including the cortex, subcortical white matter, and basal ganglia.  Pattern could be due to microembolic infarction or watershed infarction.  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  Due to complaints of left hip pain, an x-ray of his hips was obtained which was negative.  Neurology consult currently pending.  He is awaiting transfer to [facility].  I saw the patient in the emergency department this morning.  He continues to complain of left hip pain.  He is awaiting pain medication.  Family were at the bedside indicates that his facial droop and slurred speech have both improved.  Other than left hip pain, he has no other pain today.  He is not having any headaches or dizziness.  REVIEW OF SYSTEMS:   Review of Systems  Constitutional:  Positive for malaise/fatigue. Negative for chills and fever.  HENT:         Right facial droop  Eyes: Negative.   Respiratory: Negative.    Cardiovascular: Negative.   Gastrointestinal: Negative.   Musculoskeletal:        Left hip pain Right-sided weakness  Skin: Negative.   Neurological:  Positive for weakness. Negative for dizziness, seizures and headaches.  Endo/Heme/Allergies: Negative.   Psychiatric/Behavioral: Negative.     I have reviewed the past medical history, past surgical history, social history and family history with the patient and they are unchanged from previous note.   PHYSICAL EXAMINATION: ECOG PERFORMANCE STATUS: 2 - Symptomatic, <50% confined to bed  Vitals:   01/15/22 0845 01/15/22  0900  BP: 133/75 94/66  Pulse: 79 90  Resp: 12 15  Temp:    SpO2: 97% 100%   There were no vitals filed for this visit.  Intake/Output from previous day: No intake/output data recorded.  Physical Exam Vitals reviewed.  Constitutional:      General: He is not in acute  distress. HENT:     Head: Normocephalic and atraumatic.     Mouth/Throat:     Mouth: Mucous membranes are moist.     Pharynx: No oropharyngeal exudate.  Eyes:     General: No scleral icterus.    Conjunctiva/sclera: Conjunctivae normal.  Cardiovascular:     Rate and Rhythm: Normal rate and regular rhythm.  Pulmonary:     Effort: Pulmonary effort is normal.     Breath sounds: Normal breath sounds.  Abdominal:     General: Bowel sounds are normal.     Palpations: Abdomen is soft.  Musculoskeletal:     Right lower leg: No edema.     Left lower leg: No edema.  Skin:    General: Skin is warm and dry.  Neurological:     Mental Status: He is alert and oriented to person, place, and time.     Comments: No facial droop noted on exam today    LABORATORY DATA:  I have reviewed the data as listed CMP Latest Ref Rng & Units 01/14/2022 01/14/2022 01/10/2022  Glucose 70 - 99 mg/dL 110(H) 114(H) 125(H)  BUN 8 - 23 mg/dL 30(H) 32(H) 33(H)  Creatinine 0.61 - 1.24 mg/dL 3.90(H) 3.68(H) 4.27(HH)  Sodium 135 - 145 mmol/L 135 136 138  Potassium 3.5 - 5.1 mmol/L 4.5 4.5 4.6  Chloride 98 - 111 mmol/L 104 105 106  CO2 22 - 32 mmol/L - 23 23  Calcium 8.9 - 10.3 mg/dL - 8.3(L) 8.6(L)  Total Protein 6.5 - 8.1 g/dL - 5.9(L) 5.7(L)  Total Bilirubin 0.3 - 1.2 mg/dL - 0.6 0.3  Alkaline Phos 38 - 126 U/L - 86 95  AST 15 - 41 U/L - 14(L) 9(L)  ALT 0 - 44 U/L - 12 7    Lab Results  Component Value Date   WBC 6.6 01/14/2022   HGB 9.2 (L) 01/14/2022   HCT 27.0 (L) 01/14/2022   MCV 91.4 01/14/2022   PLT 205 01/14/2022   NEUTROABS 4.6 01/14/2022    No results found for: CEA1, CEA, BTD176, CA125, PSA1  CT Angio Head W/Cm &/Or Wo Cm  Result Date: 01/14/2022 CLINICAL DATA:  Right hand weakness, right-sided facial droop and slurred speech EXAM: CT ANGIOGRAPHY HEAD AND NECK TECHNIQUE: Multidetector CT imaging of the head and neck was performed using the standard protocol during bolus administration of  intravenous contrast. Multiplanar CT image reconstructions and MIPs were obtained to evaluate the vascular anatomy. Carotid stenosis measurements (when applicable) are obtained utilizing NASCET criteria, using the distal internal carotid diameter as the denominator. RADIATION DOSE REDUCTION: This exam was performed according to the departmental dose-optimization program which includes automated exposure control, adjustment of the mA and/or kV according to patient size and/or use of iterative reconstruction technique. CONTRAST:  133mL OMNIPAQUE IOHEXOL 350 MG/ML SOLN COMPARISON:  01/14/2022 CT head and MRI FINDINGS: CT HEAD FINDINGS Brain: No evidence of acute hemorrhage, cerebral edema, mass, mass effect, or midline shift. No hydrocephalus or extra-axial fluid collection. Periventricular white matter changes, likely the sequela of chronic small vessel ischemic disease. The acute infarcts seen on the same-day MRI are not significantly apparent on this  CT. Vascular: No hyperdense vessel. Skull: Normal. Negative for fracture or focal lesion. Sinuses/Orbits: No acute finding. Other: The mastoid air cells are well aerated. CTA NECK FINDINGS Aortic arch: Standard branching. Imaged portion shows no evidence of aneurysm or dissection. No significant stenosis of the major arch vessel origins. Right carotid system: No evidence of dissection, stenosis (50% or greater) or occlusion. Left carotid system: Evaluation of the origin of the left common carotid artery is somewhat limited due to beam hardening artifact from the adjacent venous bolus, however there appears to be mild narrowing at the origin. 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 No distal reconstitution of the extracranial ICA. Vertebral arteries: Moderate narrowing at the origin of the right vertebral artery, secondary to noncalcified plaque. Focal moderate stenosis of the right V2 (series 12,  image 248) at the level of C6, as well as in the right C5-C6 foramen, secondary to degenerative changes (series 12, image 242). The right vertebral artery is otherwise patent. The left vertebral artery demonstrates mild calcified narrowing in the V2 segment, but is otherwise patent. Codominant. Skeleton: Multilevel degenerative changes in the cervical spine, with trace retrolisthesis of C5 on C6. No acute osseous abnormality. Other neck: Negative. Upper chest: Dependent atelectasis. Review of the MIP images confirms the above findings CTA HEAD FINDINGS Evaluation is somewhat limited by degree of venous contamination. Anterior circulation: The left intracranial internal carotid artery is non-opacified until the ophthalmic and supraclinoid portions (series 12, image 111). Calcifications in the right intracranial internal carotid artery, which cause moderate narrowing in the distal cavernous segment and proximal supraclinoid segment. Occlusion of the left A1 (series 12, image 100), with a small area of filling in the distal left A1, likely retrograde. Patent right A1. Normal anterior communicating artery. Anterior cerebral arteries are patent to their distal aspects. No M1 stenosis or occlusion. Normal MCA bifurcations. Distal MCA branches perfused and symmetric. Posterior circulation: Vertebral arteries patent to the vertebrobasilar junction, with mild calcifications and irregularity in the right V4 segment, prior to the PICA takeoff (series 12, image 145), which causes mild-to-moderate narrowing. Mild to moderate narrowing in the left V4, proximal to the left PICA takeoff (series 12, image 144). Posterior inferior cerebral arteries patent bilaterally. Basilar patent to its distal aspect. Superior cerebellar arteries patent bilaterally. Bilateral P1 segments originate from the basilar artery. PCAs perfused to their distal aspects, with mild narrowing in the left P1/P2 and poor opacification of the distal left P2 and  P3 segments. The bilateral posterior communicating arteries are not definitively seen. Venous sinuses: As permitted by contrast timing, patent. Anatomic variants: None significant Review of the MIP images confirms the above findings IMPRESSION: 1. 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 The left intracranial internal carotid artery reconstitutes in the ophthalmic and supraclinoid portions. 2. Moderate narrowing in the distal right cavernous ICA and proximal supraclinoid segment. 3. Occlusion of the left A1.  The left ACA is otherwise patent. 4. Moderate stenosis at the origin of the right vertebral artery, with additional focal moderate stenosis of the right V2 at the level of C6, both secondary to atherosclerotic plaque and degenerative changes. Mild-to-moderate stenosis of the bilateral V4 segments. 5. Poor opacification of the distal left P2 and P3 segments. These results were called by telephone at the time of interpretation on 01/14/2022 at 8:48 pm to provider DR. DOUTOVA , who verbally acknowledged these results. Electronically Signed  By: Merilyn Baba M.D.   On: 01/14/2022 20:48   DG Chest 2 View  Result Date: 01/14/2022 CLINICAL DATA:  Facial droop EXAM: CHEST - 2 VIEW COMPARISON:  09/06/2021 FINDINGS: Right dialysis catheter remains in place, unchanged. Heart and mediastinal contours are within normal limits. No focal opacities or effusions. No acute bony abnormality. Old healed left rib fractures. IMPRESSION: No active cardiopulmonary disease. Electronically Signed   By: Rolm Baptise M.D.   On: 01/14/2022 19:34   CT HEAD WO CONTRAST  Result Date: 01/14/2022 CLINICAL DATA:  Facial droop and right arm weakness and numbness for the past 2 days. EXAM: CT HEAD WITHOUT CONTRAST TECHNIQUE: Contiguous axial images were obtained from the base of the skull through the vertex without intravenous contrast. RADIATION DOSE REDUCTION: This exam was performed  according to the departmental dose-optimization program which includes automated exposure control, adjustment of the mA and/or kV according to patient size and/or use of iterative reconstruction technique. COMPARISON:  None. FINDINGS: Brain: Normal size and position of the ventricles. Minimal patchy white matter low density in both cerebral hemispheres. No intracranial hemorrhage, mass lesion or CT evidence of acute infarction. Vascular: No hyperdense vessel or unexpected calcification. Skull: Normal. Negative for fracture or focal lesion. Sinuses/Orbits: Unremarkable. Other: None. IMPRESSION: 1. No acute abnormality. 2. Minimal chronic small vessel white matter ischemic changes in both cerebral hemispheres. These make it difficult to exclude a small acute subcortical infarct. If this is a continued clinical concern, this could be further evaluated with an MRI of the brain. Electronically Signed   By: Claudie Revering M.D.   On: 01/14/2022 14:17   CT Angio Neck W and/or Wo Contrast  Result Date: 01/14/2022 CLINICAL DATA:  Right hand weakness, right-sided facial droop and slurred speech EXAM: CT ANGIOGRAPHY HEAD AND NECK TECHNIQUE: Multidetector CT imaging of the head and neck was performed using the standard protocol during bolus administration of intravenous contrast. Multiplanar CT image reconstructions and MIPs were obtained to evaluate the vascular anatomy. Carotid stenosis measurements (when applicable) are obtained utilizing NASCET criteria, using the distal internal carotid diameter as the denominator. RADIATION DOSE REDUCTION: This exam was performed according to the departmental dose-optimization program which includes automated exposure control, adjustment of the mA and/or kV according to patient size and/or use of iterative reconstruction technique. CONTRAST:  126mL OMNIPAQUE IOHEXOL 350 MG/ML SOLN COMPARISON:  01/14/2022 CT head and MRI FINDINGS: CT HEAD FINDINGS Brain: No evidence of acute hemorrhage,  cerebral edema, mass, mass effect, or midline shift. No hydrocephalus or extra-axial fluid collection. Periventricular white matter changes, likely the sequela of chronic small vessel ischemic disease. The acute infarcts seen on the same-day MRI are not significantly apparent on this CT. Vascular: No hyperdense vessel. Skull: Normal. Negative for fracture or focal lesion. Sinuses/Orbits: No acute finding. Other: The mastoid air cells are well aerated. CTA NECK FINDINGS Aortic arch: Standard branching. Imaged portion shows no evidence of aneurysm or dissection. No significant stenosis of the major arch vessel origins. Right carotid system: No evidence of dissection, stenosis (50% or greater) or occlusion. Left carotid system: Evaluation of the origin of the left common carotid artery is somewhat limited due to beam hardening artifact from the adjacent venous bolus, however there appears to be mild narrowing at the origin. 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 No distal reconstitution of the extracranial ICA. Vertebral arteries: Moderate narrowing at the origin of the right vertebral artery, secondary  to noncalcified plaque. Focal moderate stenosis of the right V2 (series 12, image 248) at the level of C6, as well as in the right C5-C6 foramen, secondary to degenerative changes (series 12, image 242). The right vertebral artery is otherwise patent. The left vertebral artery demonstrates mild calcified narrowing in the V2 segment, but is otherwise patent. Codominant. Skeleton: Multilevel degenerative changes in the cervical spine, with trace retrolisthesis of C5 on C6. No acute osseous abnormality. Other neck: Negative. Upper chest: Dependent atelectasis. Review of the MIP images confirms the above findings CTA HEAD FINDINGS Evaluation is somewhat limited by degree of venous contamination. Anterior circulation: The left intracranial internal  carotid artery is non-opacified until the ophthalmic and supraclinoid portions (series 12, image 111). Calcifications in the right intracranial internal carotid artery, which cause moderate narrowing in the distal cavernous segment and proximal supraclinoid segment. Occlusion of the left A1 (series 12, image 100), with a small area of filling in the distal left A1, likely retrograde. Patent right A1. Normal anterior communicating artery. Anterior cerebral arteries are patent to their distal aspects. No M1 stenosis or occlusion. Normal MCA bifurcations. Distal MCA branches perfused and symmetric. Posterior circulation: Vertebral arteries patent to the vertebrobasilar junction, with mild calcifications and irregularity in the right V4 segment, prior to the PICA takeoff (series 12, image 145), which causes mild-to-moderate narrowing. Mild to moderate narrowing in the left V4, proximal to the left PICA takeoff (series 12, image 144). Posterior inferior cerebral arteries patent bilaterally. Basilar patent to its distal aspect. Superior cerebellar arteries patent bilaterally. Bilateral P1 segments originate from the basilar artery. PCAs perfused to their distal aspects, with mild narrowing in the left P1/P2 and poor opacification of the distal left P2 and P3 segments. The bilateral posterior communicating arteries are not definitively seen. Venous sinuses: As permitted by contrast timing, patent. Anatomic variants: None significant Review of the MIP images confirms the above findings IMPRESSION: 1. 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 The left intracranial internal carotid artery reconstitutes in the ophthalmic and supraclinoid portions. 2. Moderate narrowing in the distal right cavernous ICA and proximal supraclinoid segment. 3. Occlusion of the left A1.  The left ACA is otherwise patent. 4. Moderate stenosis at the origin of the right vertebral artery, with additional  focal moderate stenosis of the right V2 at the level of C6, both secondary to atherosclerotic plaque and degenerative changes. Mild-to-moderate stenosis of the bilateral V4 segments. 5. Poor opacification of the distal left P2 and P3 segments. These results were called by telephone at the time of interpretation on 01/14/2022 at 8:48 pm to provider DR. DOUTOVA , who verbally acknowledged these results. Electronically Signed   By: Merilyn Baba M.D.   On: 01/14/2022 20:48   MR BRAIN WO CONTRAST  Result Date: 01/14/2022 CLINICAL DATA:  Neuro deficit, acute, stroke suspected. EXAM: MRI HEAD WITHOUT CONTRAST TECHNIQUE: Multiplanar, multiecho pulse sequences of the brain and surrounding structures were obtained without intravenous contrast. COMPARISON:  Head CT same day FINDINGS: Brain: Diffusion imaging shows multiple small acute infarctions within the left hemisphere scattered within the MCA territory which could either be due to micro embolic infarctions or watershed infarctions. Micro embolic infarctions are favored. No large confluent infarction. No mass effect or swelling. Elsewhere, the brainstem and cerebellum are normal. Cerebral hemispheres show mild to moderate chronic small-vessel ischemic changes of the white matter. No mass lesion, hemorrhage, hydrocephalus or extra-axial collection. Vascular: Major vessels at the base of  the brain show flow. 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 This pattern could be due to micro embolic infarction or watershed infarction. No large confluent infarction. No mass effect or hemorrhage. Moderate chronic small-vessel ischemic changes elsewhere affecting the cerebral hemispheric white matter. Electronically Signed   By: Nelson Chimes M.D.   On: 01/14/2022 17:58   DG Chest Port 1  View  Result Date: 01/15/2022 CLINICAL DATA:  Weakness. EXAM: PORTABLE CHEST 1 VIEW COMPARISON:  09/06/2021 FINDINGS: Dialysis catheter with tip at the upper cavoatrial junction. There is no edema, consolidation, effusion, or pneumothorax. Artifact from EKG leads. Normal heart size and mediastinal contours. IMPRESSION: No evidence of active disease. Electronically Signed   By: Jorje Guild M.D.   On: 01/15/2022 07:43   DG HIPS BILAT WITH PELVIS MIN 5 VIEWS  Result Date: 01/14/2022 CLINICAL DATA:  Bilateral hip pain EXAM: DG HIP (WITH OR WITHOUT PELVIS) 5+V BILAT COMPARISON:  None. FINDINGS: There is no evidence of hip fracture or dislocation. There is no evidence of arthropathy or other focal bone abnormality. IMPRESSION: Negative. Electronically Signed   By: Fidela Salisbury M.D.   On: 01/14/2022 21:57     Future Appointments  Date Time Provider [place]  01/15/2022 11:00 AM [facility]- ECHO 1-RUTH [facility]ARDUS [facility]  01/17/2022  9:00 AM [facility] LAB [facility] None  01/17/2022  9:40 AM Truitt Merle, MD [facility] None  01/17/2022 10:30 AM [facility] INFUSION [facility] None  01/22/2022 11:00 AM [facility] PA/NP [facility] None  01/24/2022  8:00 AM [facility] LAB [facility] None  01/24/2022  9:00 AM [facility] INFUSION [facility] None  01/31/2022  8:00 AM [facility] LAB [facility] None  01/31/2022  9:00 AM [facility] INFUSION [facility] None  01/31/2022  9:00 AM [facility] PALLIATIVE CARE [facility] None  02/05/2022  9:00 AM [facility] VASC 3 - EM [facility]  02/05/2022  9:30 AM [facility] VASC 3 - EM [facility]  02/05/2022 10:00 AM Marty Heck, MD [facility]  02/07/2022  7:45 AM [facility] LAB [facility] None  02/07/2022  8:20 AM Truitt Merle, MD [facility] None  02/07/2022  9:15 AM [facility] INFUSION [facility] None  02/14/2022  8:00 AM [facility] LAB [facility] None  02/14/2022  9:00 AM [facility] INFUSION [facility] None  02/21/2022  8:00 AM [facility] LAB [facility] None   02/21/2022  9:00 AM [facility] INFUSION [facility] None      LOS: 1 day   Addendum  I have seen the patient, examined him. I agree with the assessment and and plan and have edited the notes.   Mr. Deleo speech has improve, facial droop resolved. He denies any other neurological symptoms. Lab and scan reviewed, MRI showed numerous punctate infarctions scattered within the left MCA territory secondary to micro embolic infarction or watershed infarction. 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 Appreciate neuro team's input. His underline amyloidosis and current chemo certainly increase his risk of thrombosis. I do not see any contraindication for anticoagulation if neuro team recommends, but his only option will be coumadin due to his ESRD and HD. I will hold on his chemo for the next few weeks, and f/u soon after his discharge. Please call us if needed.  Truitt Merle  01/15/2022

## 2022-01-15 NOTE — Progress Notes (Addendum)
Progress Note   Patient: Caleb Simmons SFK:[id number] DOB: 11-13-1951 DOA: 01/14/2022     1 DOS: the patient was seen and examined on 01/15/2022   Brief hospital course: Past medical history of amyloidosis, plasma cell myeloma, ESRD on HD MWF, tobacco use, chronic combined systolic and diastolic CHF.  Presents with complaints of facial droop and slurred speech.  Found to have acute left MCA territory infarct likely watershed infarct from hypertension in the setting of left ICA stenosis 1/23, presented with above complaint.  Work-up initiated. 1/24 transfer to [facility], neuro IR consulted.  Assessment and Plan * Acute ischemic left MCA stroke ([place])- (present on admission) Presents with dysarthria and facial droop as well as right-sided weakness. 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 Occlusion of left A1, moderate stenosis of right vertebral artery. Neurology consulted. Transfer to [place] and speech consulted as well. Currently on aspirin and Plavix. Neuro IR also consulted.  Patient will undergo cerebral angiogram.  Most likely Wednesday.  Currently n.p.o. after midnight. Echocardiogram pending.  A1c pending.  Lipid panel pending  PAD (peripheral artery disease) ([place])- (present on admission) CTA shows complete occlusion of left ICA, narrowing of distal ICA on right, occlusion of left A1, moderate stenosis of right vertebral artery. Placing the patient at high risk of poor outcome. Neuro IR consulted.  Anemia due to chronic kidney disease- (present on admission) Not on ESA candidate. Monitor.  ESRD (end stage renal disease) ([place])- (present on admission) Nephrology consult  appreciated. Has history of moderate doses myeloma.  Recently placed on HD.  MWF schedule. HD per nephrology.   BPH (benign prostatic hyperplasia)- (present on admission) No evidence of obstruction. On Flomax.  Continue.   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 Outpatient follow-up with oncology.  On CyBorD and daratumumab Due for his treatment later this week.  Tobacco abuse- (present on admission) Admitting provider discussed with patient regarding quitting. Patient is not interested in quitting right now.  On nicotine patch.     Subjective: No nausea no vomiting no fever no chills.  No chest pain abdominal pain.  No headache no dizziness or lightheadedness.  Still has issues with his speech.  No other weakness.  Objective Vitals:   01/15/22 1330 01/15/22 1345 01/15/22 1352 01/15/22 1434  BP: 118/75 117/65 110/78 130/70  Pulse: 87 74 81 79  Resp: 17 12 (!) 24 18  Temp:    98 F (36.7 C)  TempSrc:    Oral  SpO2: 100% 99% 98% 100%    General: Appear in mild distress, no Rash; Oral Mucosa Clear, moist. no Abnormal Neck Mass Or lumps, Conjunctiva normal  Cardiovascular: S1 and S2 Present, no Murmur, Respiratory: good respiratory effort, Bilateral Air entry present and CTA, no Crackles, no wheezes Abdomen: Bowel Sound present, Soft and no tenderness Extremities: no Pedal edema Neurology: alert and oriented to time, place, and person affect appropriate. no new focal deficit, mild dysarthria Gait not checked due to patient safety concerns   Data Reviewed:  I have Reviewed nursing notes, Vitals, and Lab results since pt's last encounter. Pertinent lab results CMP shows elevated creatinine, vitamin B12 322, CBC shows hemoglobin  of 9.4. I have ordered test including CBC and renal function panel I have independently visualized and interpreted imaging CT head and neck with contrast which showed complete occlusion of the left  ICA. I have independently visualized and interpreted EKG which showed EKG: normal sinus rhythm, nonspecific ST and T waves changes. I have reviewed the last note from neurology, and discussed pt's care plan and test results with them.   Family Communication: Wife at bedside.  Disposition: Status is: Inpatient  Remains inpatient appropriate because: Requires further stroke work-up         Author: Berle Mull, MD 01/15/2022 4:50 PM  For on call review [email or web address].

## 2022-01-15 NOTE — Assessment & Plan Note (Signed)
Admitting provider discussed with patient regarding quitting. Patient is not interested in quitting right now.  On nicotine patch.

## 2022-01-15 NOTE — Assessment & Plan Note (Signed)
-   Spoke about importance of quitting spent 5 minutes discussing options for treatment, prior attempts at quitting, and dangers of smoking  -At this point patient is  NOT  interested in quitting  - order nicotine patch   - nursing tobacco cessation protocol

## 2022-01-15 NOTE — Progress Notes (Signed)
Oncology Discharge Planning Admission Note  [facility] Address: [address], [place], [place] [address] Hours of Operation:  8am - 5pm, Monday - Friday  Clinic Contact Information:  [phone]  Oncology Care Team: Medical Oncologist:  Dr. Octavio Graves, NP is aware of this hospital admission dated 01/14/22 and has assessed patient at bedside. The cancer center will follow [name] inpatient care to assist with discharge planning as indicated by the oncologist.  We will reach out closer to discharge date to arrange follow up care.  Disclaimer:  This [place] note does not imply a formal consult request has been made by the admitting attending for this admission or there will be an inpatient consult completed by oncology.  Please request oncology consults as per standard process as indicated.

## 2022-01-15 NOTE — Evaluation (Signed)
Physical Therapy Evaluation and Discharge Patient Details Name: Caleb Simmons MRN: [id number] DOB: 1951-02-14 Today's Date: 01/15/2022  History of Present Illness  Patient is a 71 year old male admitted with slurred speech and right side weakness. + for Numerous punctate infarctions scattered within the left MCA  territory including the cortex, subcortical white matter and basal ganglia. 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  Clinical Impression  Patient evaluated by Physical Therapy with no further acute PT needs identified. All education has been completed and the patient has no further questions. Pt presents with equal strength bilaterally and is independent with mobility including ambulation 400', ascending and descending stairs and performing balance activities in hallway. He continues to have some word finding difficulties. Stroke education done with he and significant other. No further PT needs. Encouraged pt to ambulate in hallway 3-4x/ day.   See below for any follow-up Physical Therapy or equipment needs. PT is signing off. Thank you for this referral.        Recommendations for follow up therapy are one component of a multi-disciplinary discharge planning process, led by the attending physician.  Recommendations may be updated based on patient status, additional functional criteria and insurance authorization.  Follow Up Recommendations No PT follow up    Assistance Recommended at Discharge None  Patient can return home with the following       Equipment Recommendations None recommended by PT  Recommendations for Other Services       Functional Status Assessment Patient has not had a recent decline in their functional status     Precautions / Restrictions Precautions Precautions: None Restrictions Weight Bearing Restrictions: No      Mobility  Bed Mobility Overal bed mobility: Independent                  Transfers Overall  transfer level: Independent Equipment used: None                    Ambulation/Gait Ambulation/Gait assistance: Modified independent (Device/Increase time) Gait Distance (Feet): 400 Feet Assistive device: None Gait Pattern/deviations: WFL(Within Functional Limits) Gait velocity: WFL Gait velocity interpretation: >4.37 ft/sec, indicative of normal walking speed   General Gait Details: pt able to change pace, ambulate bkwds, make quick changes in direction without LOB or instability  Stairs Stairs: Yes Stairs assistance: Modified independent (Device/Increase time) Stair Management: One rail Right, Alternating pattern, Forwards Number of Stairs: 4 General stair comments: no difficulty with stairs  Wheelchair Mobility    Modified Rankin (Stroke Patients Only) Modified Rankin (Stroke Patients Only) Pre-Morbid Rankin Score: No symptoms Modified Rankin: No significant disability     Balance Overall balance assessment: Modified Independent                                           Pertinent Vitals/Pain Pain Assessment Pain Assessment: Faces Faces Pain Scale: No hurt    Home Living Family/patient expects to be discharged to:: Private residence Living Arrangements: Spouse/significant other Available Help at Discharge: Available 24 hours/day Type of Home: House Home Access: Stairs to enter Entrance Stairs-Rails: None Entrance Stairs-Number of Steps: 3-4   Home Layout: One level Home Equipment: Conservation officer, nature (2 wheels);Shower seat Additional Comments: Pt worked at [employer] up until a few months ago when he started HD    Prior Function Prior Level of Function : Independent/Modified  Independent                     Hand Dominance   Dominant Hand: Right    Extremity/Trunk Assessment   Upper Extremity Assessment Upper Extremity Assessment: Overall WFL for tasks assessed    Lower Extremity Assessment Lower Extremity Assessment:  Overall WFL for tasks assessed    Cervical / Trunk Assessment Cervical / Trunk Assessment: Normal  Communication   Communication: Expressive difficulties (word finding)  Cognition Arousal/Alertness: Awake/alert Behavior During Therapy: WFL for tasks assessed/performed Overall Cognitive Status: Within Functional Limits for tasks assessed                                          General Comments General comments (skin integrity, edema, etc.): pt able to reach to floor and pick up object, turn in complete circle in <4 secs, look over B shoulders. VSS    Exercises     Assessment/Plan    PT Assessment Patient does not need any further PT services  PT Problem List         PT Treatment Interventions      PT Goals (Current goals can be found in the Care Plan section)  Acute Rehab PT Goals Patient Stated Goal: return home asap PT Goal Formulation: All assessment and education complete, DC therapy    Frequency       Co-evaluation               AM-PAC PT "6 Clicks" Mobility  Outcome Measure Help needed turning from your back to your side while in a flat bed without using bedrails?: None Help needed moving from lying on your back to sitting on the side of a flat bed without using bedrails?: None Help needed moving to and from a bed to a chair (including a wheelchair)?: None Help needed standing up from a chair using your arms (e.g., wheelchair or bedside chair)?: None Help needed to walk in hospital room?: None Help needed climbing 3-5 steps with a railing? : None 6 Click Score: 24    End of Session Equipment Utilized During Treatment: Gait belt Activity Tolerance: Patient tolerated treatment well Patient left: in bed;with call bell/phone within reach;with family/visitor present Nurse Communication: Mobility status PT Visit Diagnosis: Unsteadiness on feet (R26.81)    Time: 7564-3329 PT Time Calculation (min) (ACUTE ONLY): 15 min   Charges:   PT  Evaluation $PT Eval Low Complexity: 1 Low          [place]  Pager [phone] Office [place] 01/15/2022, 5:00 PM

## 2022-01-15 NOTE — Assessment & Plan Note (Addendum)
-   will admit based on TIA/CVA protocol,         Monitor on Tele       /MRI  Resulted - showing acute ischemic CVA        CTA showing left ICA occlusion reconstituted on 4 mics of clinoid portions. 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  Since event occurred days ago there is no indication for intervention at this time       Echo to evaluate for possible embolic source,        obtain cardiac enzymes,  ECG,   Lipid panel, TSH.        Order PT/OT evaluation.          Will make sure patient is on antiplatelet ASA  325 statin       Allow permissive Hypertension keep BP <220/120        Neurology consulted will see in AM  Given significant left ICA occlusion can further discuss with vascular in a.m.

## 2022-01-16 ENCOUNTER — Inpatient Hospital Stay (HOSPITAL_COMMUNITY): Payer: Commercial Managed Care - PPO

## 2022-01-16 DIAGNOSIS — D631 Anemia in chronic kidney disease: Secondary | ICD-10-CM

## 2022-01-16 DIAGNOSIS — I739 Peripheral vascular disease, unspecified: Secondary | ICD-10-CM

## 2022-01-16 DIAGNOSIS — Z720 Tobacco use: Secondary | ICD-10-CM

## 2022-01-16 DIAGNOSIS — I63512 Cerebral infarction due to unspecified occlusion or stenosis of left middle cerebral artery: Secondary | ICD-10-CM | POA: Diagnosis not present

## 2022-01-16 DIAGNOSIS — E8581 Light chain (AL) amyloidosis: Secondary | ICD-10-CM

## 2022-01-16 DIAGNOSIS — N186 End stage renal disease: Secondary | ICD-10-CM

## 2022-01-16 DIAGNOSIS — E440 Moderate protein-calorie malnutrition: Secondary | ICD-10-CM

## 2022-01-16 DIAGNOSIS — Z992 Dependence on renal dialysis: Secondary | ICD-10-CM

## 2022-01-16 DIAGNOSIS — I63232 Cerebral infarction due to unspecified occlusion or stenosis of left carotid arteries: Secondary | ICD-10-CM

## 2022-01-16 HISTORY — PX: IR US GUIDE VASC ACCESS RIGHT: IMG2390

## 2022-01-16 HISTORY — PX: IR ANGIO VERTEBRAL SEL SUBCLAVIAN INNOMINATE BILAT MOD SED: IMG5366

## 2022-01-16 HISTORY — PX: IR ANGIO INTRA EXTRACRAN SEL COM CAROTID INNOMINATE BILAT MOD SED: IMG5360

## 2022-01-16 LAB — CBC
HCT: 25.7 % — ABNORMAL LOW (ref 39.0–52.0)
Hemoglobin: 8.5 g/dL — ABNORMAL LOW (ref 13.0–17.0)
MCH: 30.2 pg (ref 26.0–34.0)
MCHC: 33.1 g/dL (ref 30.0–36.0)
MCV: 91.5 fL (ref 80.0–100.0)
Platelets: 219 10*3/uL (ref 150–400)
RBC: 2.81 MIL/uL — ABNORMAL LOW (ref 4.22–5.81)
RDW: 17.2 % — ABNORMAL HIGH (ref 11.5–15.5)
WBC: 6.7 10*3/uL (ref 4.0–10.5)
nRBC: 0 % (ref 0.0–0.2)

## 2022-01-16 LAB — ECHOCARDIOGRAM COMPLETE
AR max vel: 2.08 cm2
AV Area VTI: 1.94 cm2
AV Area mean vel: 1.97 cm2
AV Mean grad: 5 mmHg
AV Peak grad: 9 mmHg
Ao pk vel: 1.5 m/s
Area-P 1/2: 4.57 cm2
S' Lateral: 3.8 cm
Weight: 2610.25 oz

## 2022-01-16 LAB — HEPATITIS B SURFACE ANTIBODY, QUANTITATIVE: Hep B S AB Quant (Post): <3.1 m[IU]/mL — ABNORMAL LOW (ref >9.9)

## 2022-01-16 LAB — HEMOGLOBIN A1C
Hgb A1c MFr Bld: 5.6 % (ref 4.8–5.6)
Mean Plasma Glucose: 114 mg/dL

## 2022-01-16 LAB — BASIC METABOLIC PANEL
Anion gap: 10 (ref 5–15)
BUN: 38 mg/dL — ABNORMAL HIGH (ref 8–23)
CO2: 19 mmol/L — ABNORMAL LOW (ref 22–32)
Calcium: 8.5 mg/dL — ABNORMAL LOW (ref 8.9–10.3)
Chloride: 104 mmol/L (ref 98–111)
Creatinine, Ser: 4.73 mg/dL — ABNORMAL HIGH (ref 0.61–1.24)
GFR, Estimated: 12 mL/min — ABNORMAL LOW (ref >60)
Glucose, Bld: 88 mg/dL (ref 70–99)
Potassium: 5.2 mmol/L — ABNORMAL HIGH (ref 3.5–5.1)
Sodium: 133 mmol/L — ABNORMAL LOW (ref 135–145)

## 2022-01-16 LAB — LDL CHOLESTEROL, DIRECT: Direct LDL: 85.5 mg/dL (ref 0–99)

## 2022-01-16 LAB — PROTIME-INR
INR: 1 (ref 0.8–1.2)
Prothrombin Time: 13.4 seconds (ref 11.4–15.2)

## 2022-01-16 IMAGING — DX DG HIP (WITH OR WITHOUT PELVIS) 2-3V*L*
3 series · 3 of 3 positions shown · non-contrast
Comparison: Hip x-ray [DATE].

CLINICAL DATA: Left hip pain.  No injury.

EXAM:
DG HIP (WITH OR WITHOUT PELVIS) 2-3V LEFT

[t pelvis ap]
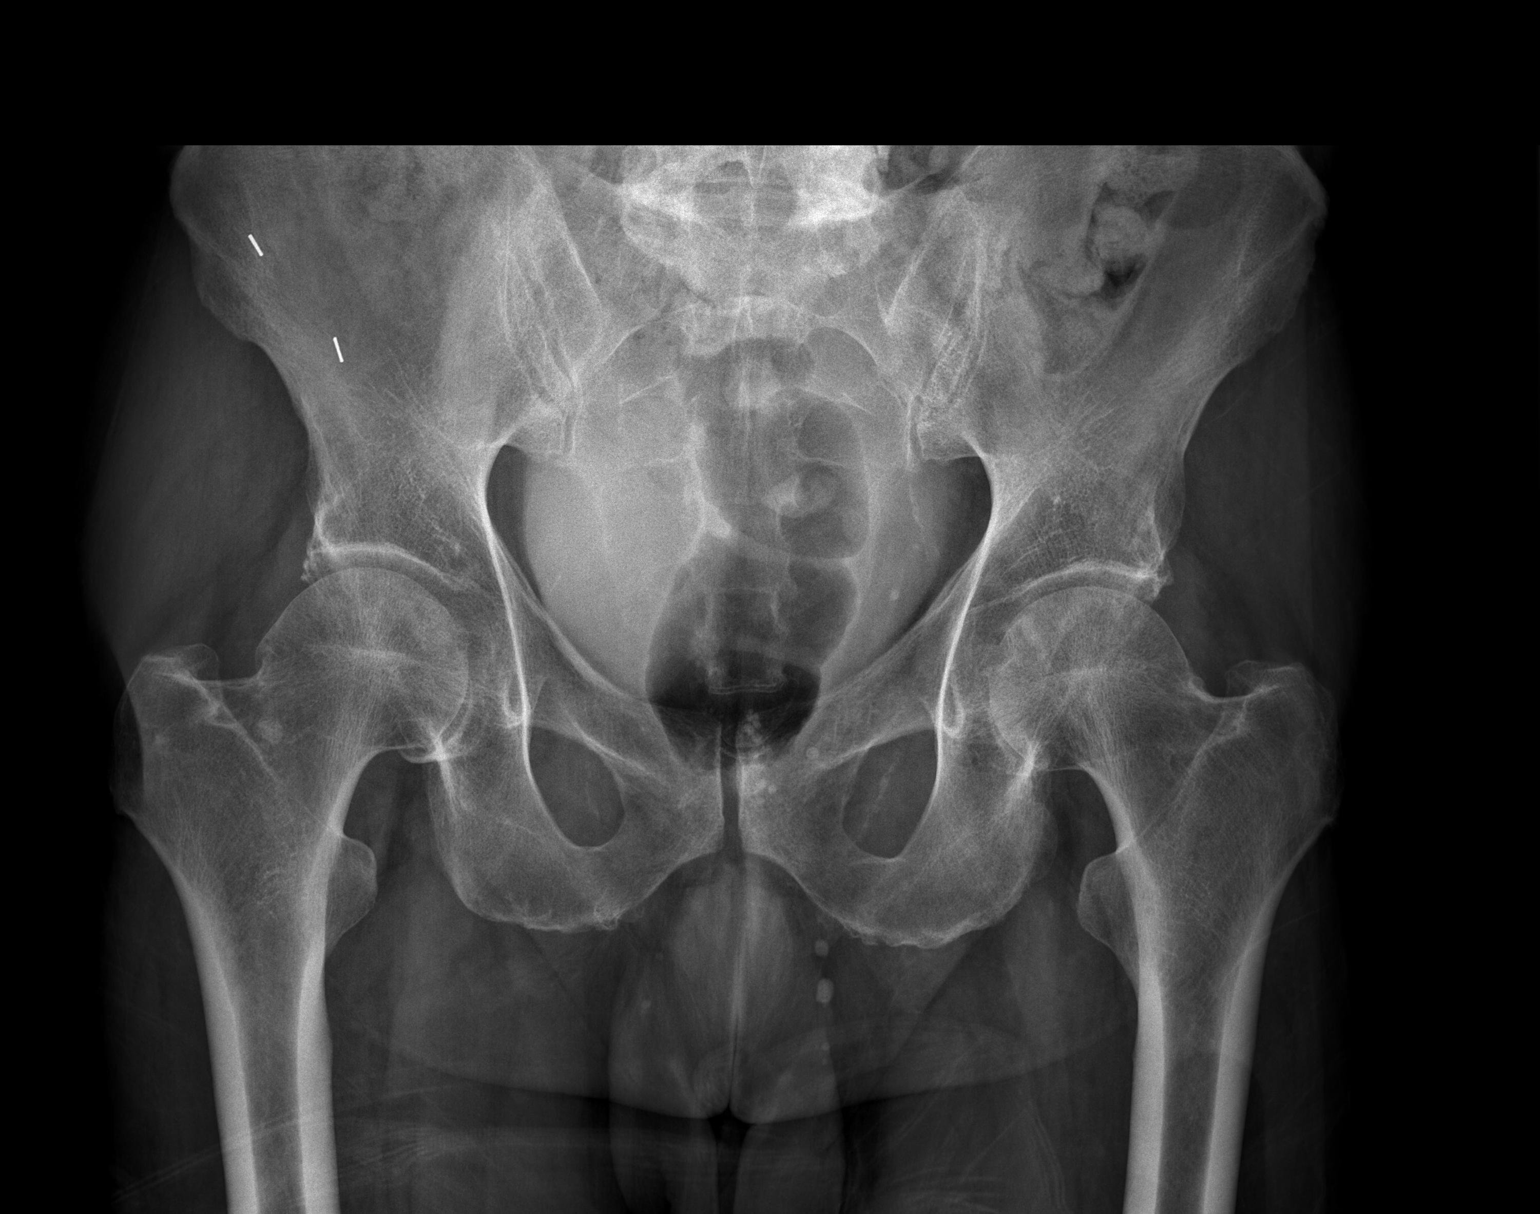

[t hip ap left]
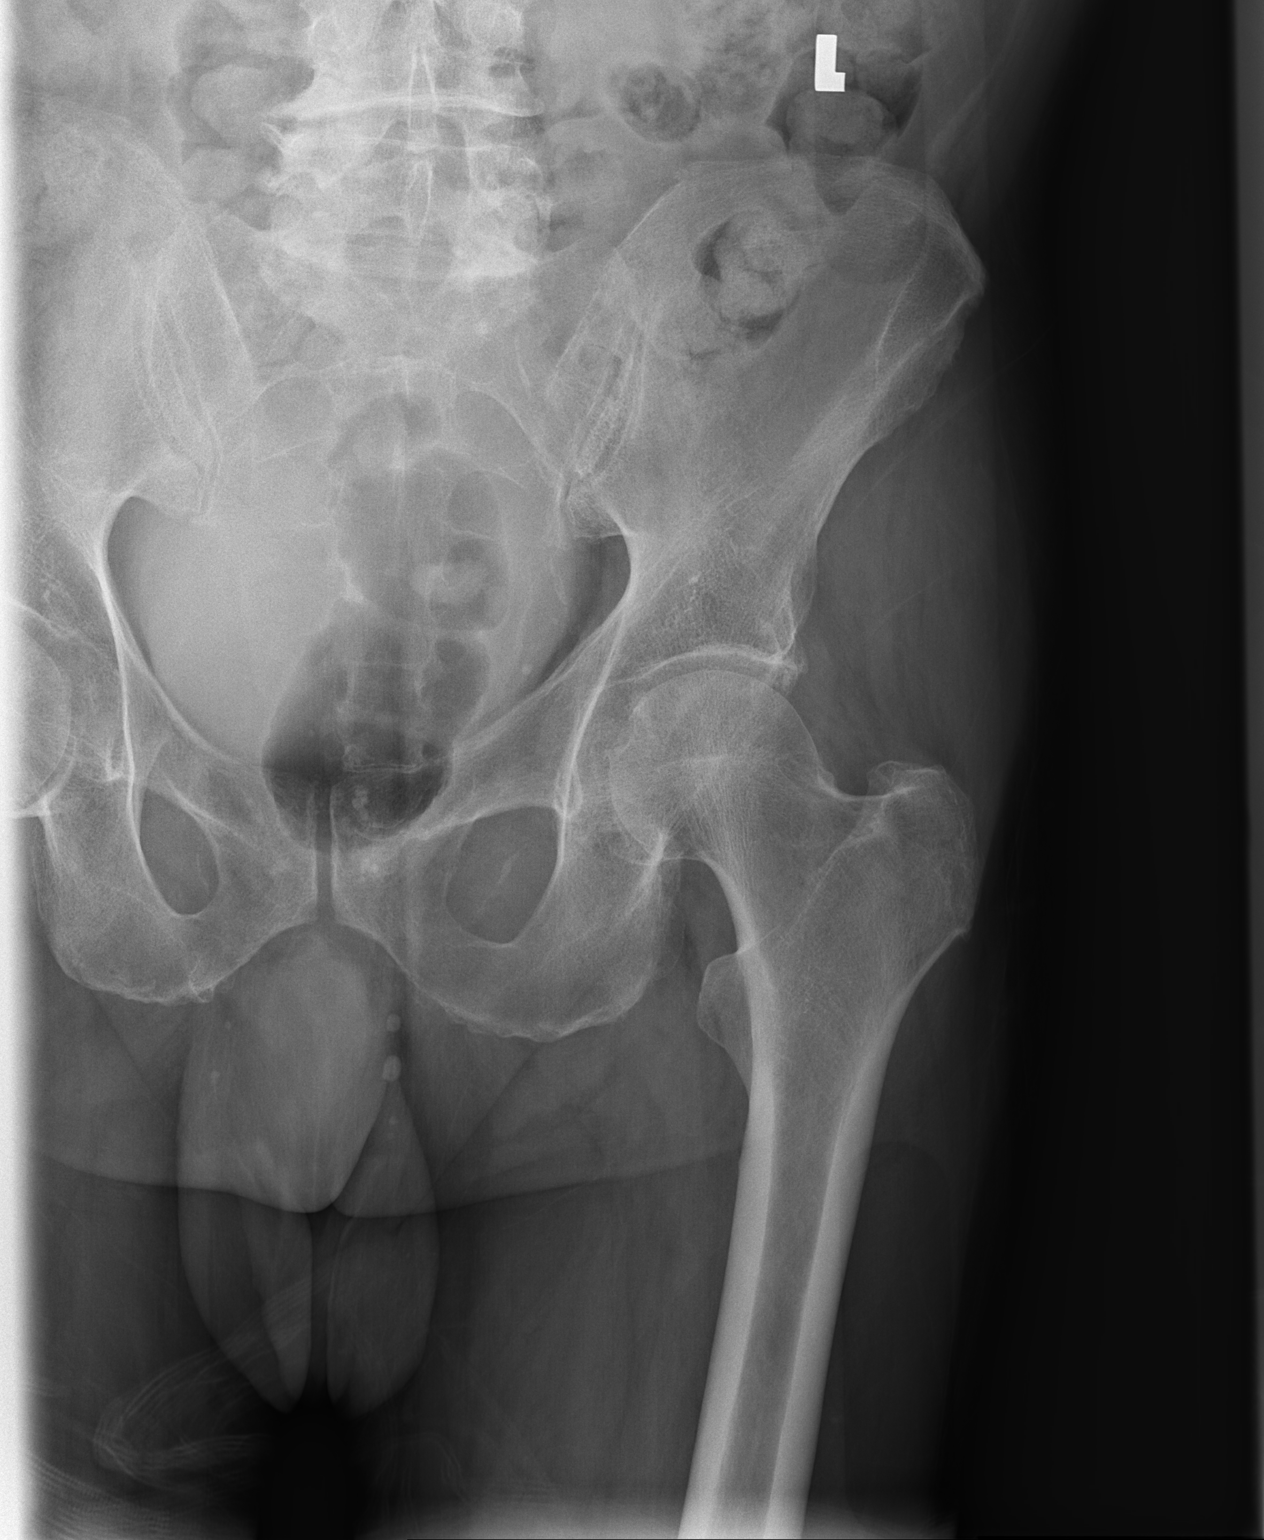

[t hip frog leg left]
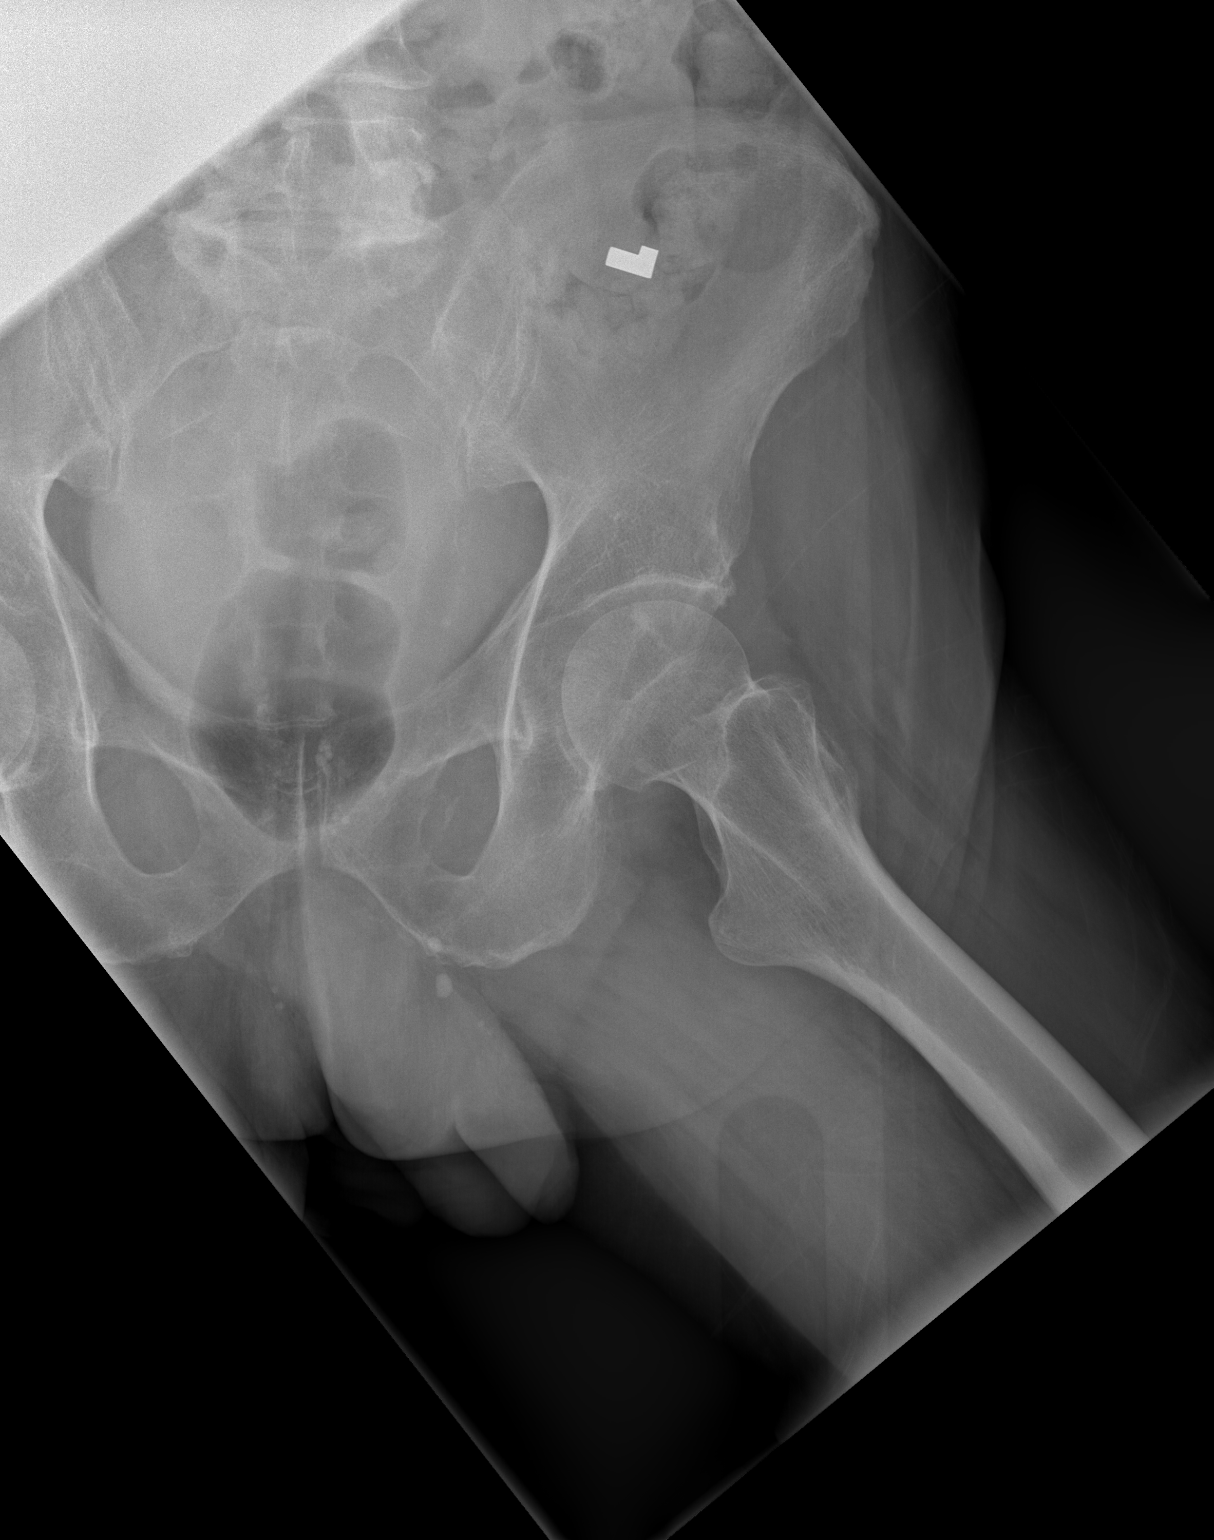

[3 of 3 positions shown; findings below may reference images not displayed]

FINDINGS: There is no evidence of hip fracture or dislocation. There is no
evidence of arthropathy or other focal bone abnormality.
IMPRESSION: Negative.

## 2022-01-16 IMAGING — US IR ANGIO INTRA EXTRACRAN SEL COM CAROTID INNOMINATE BILAT MOD SE
1 of 2 series · 11 of 24 positions shown · non-contrast
Comparison: CT angiogram of the head and neck [DATE].

CLINICAL DATA: History of transient ischemic attack comprising of
right hand weakness with slurred speech evaluation of the brain
revealed numerous diffusion-weighted abnormalities in the left
middle cerebral artery distribution. CT of the head and neck
revealed left internal carotid artery proximal occlusion and
questionable string sign.

EXAM:
BILATERAL COMMON CAROTID AND INNOMINATE ANGIOGRAPHY
TECHNIQUE: Informed written consent was obtained from the patient after a
thorough discussion of the procedural risks, benefits and
alternatives. All questions were addressed. Maximal Sterile Barrier
Technique was utilized including caps, mask, sterile gowns, sterile
gloves, sterile drape, hand hygiene and skin antiseptic. A timeout
was performed prior to the initiation of the procedure.

[Series 300: dr. (person_name). · 11 of 337 slices shown]
[im 1/337]
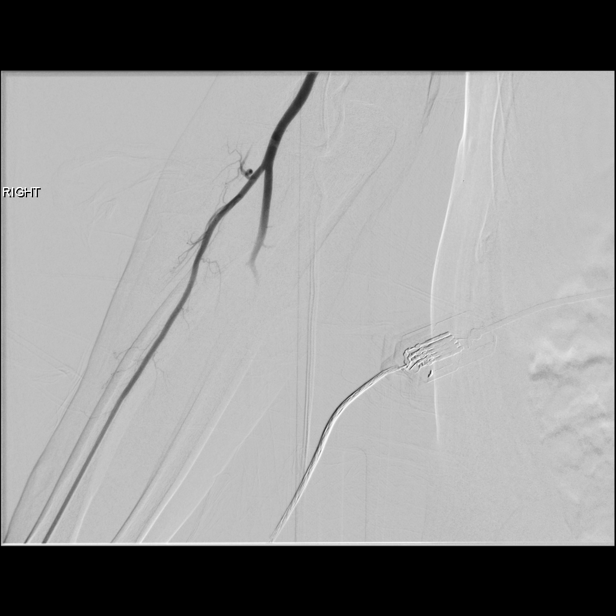
[im 31/337]
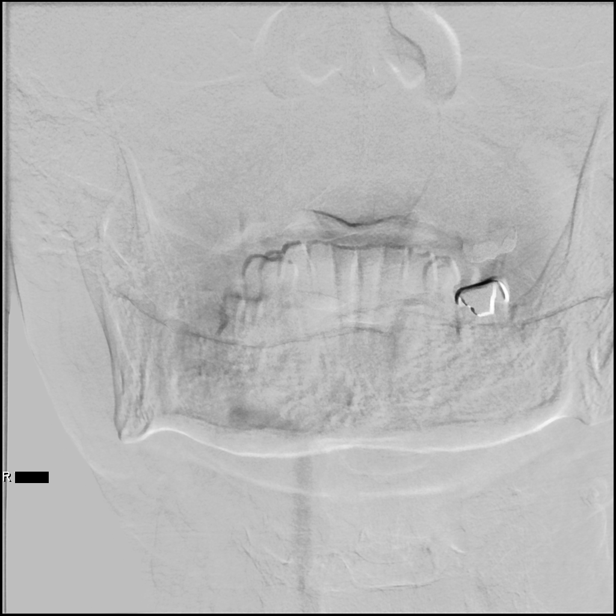
[im 62/337]
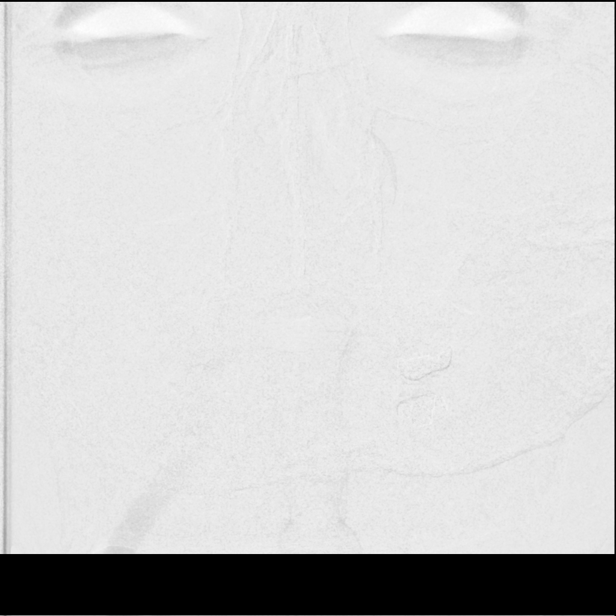
[im 92/337]
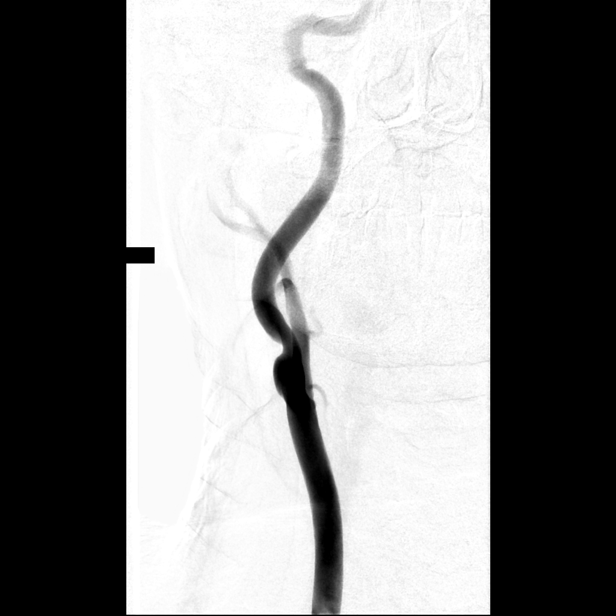
[im 123/337]
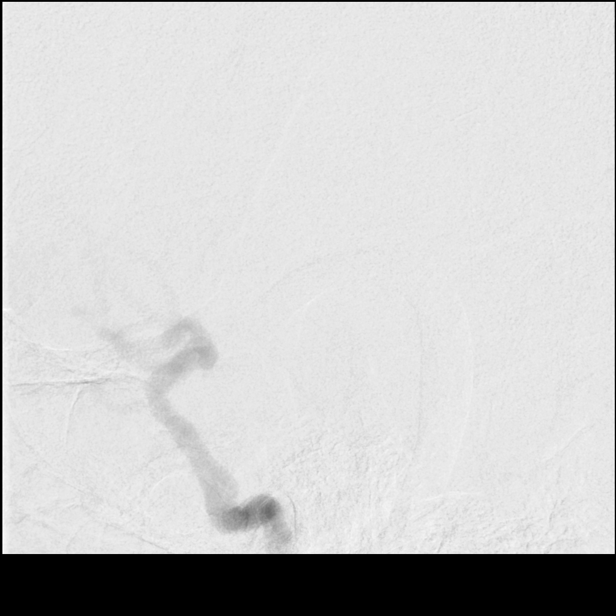
[im 169/337]
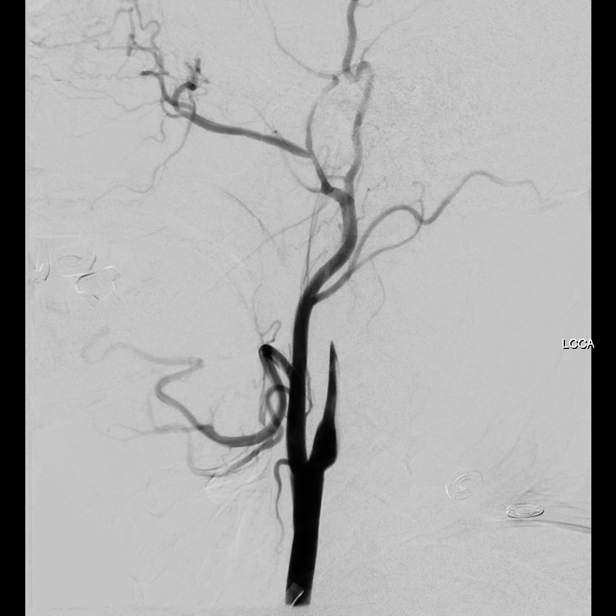
[im 199/337]
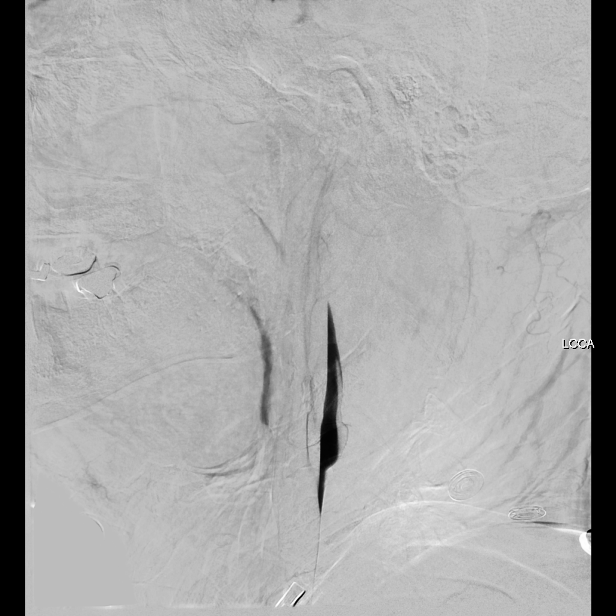
[im 230/337]
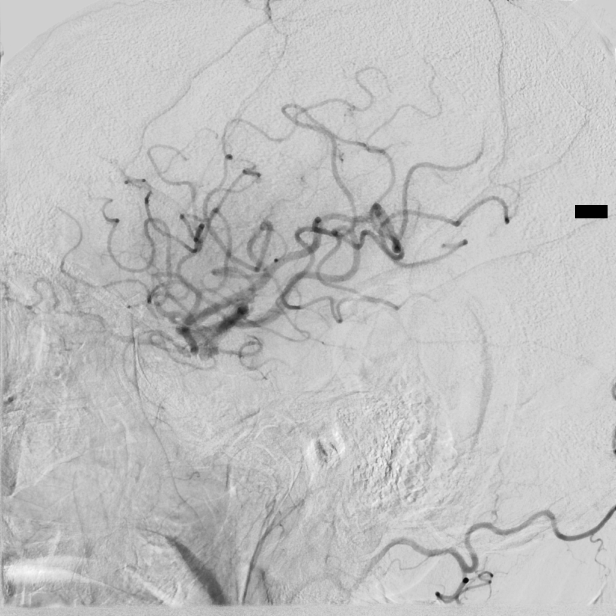
[im 260/337]
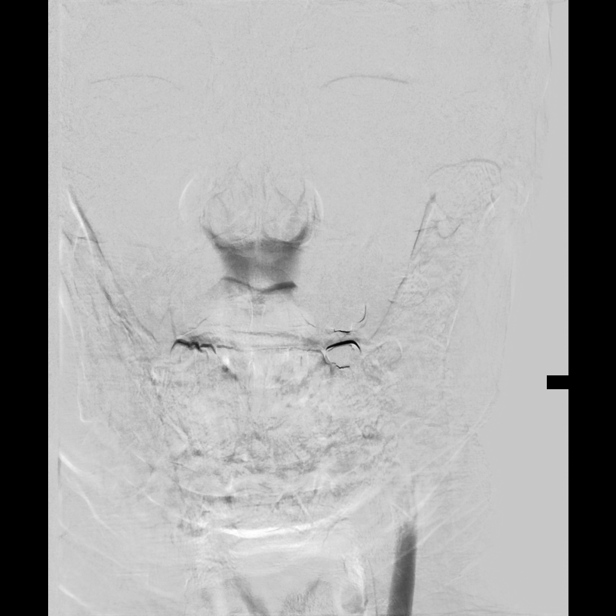
[im 291/337]
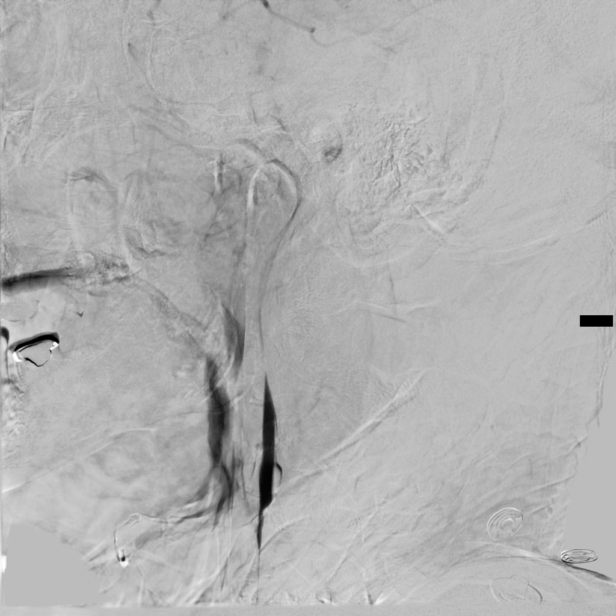
[im 321/337]
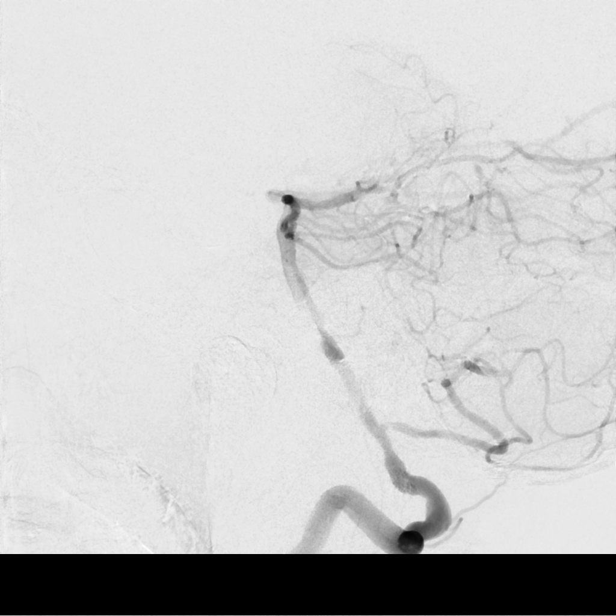

[11 of 24 positions shown; findings below may reference images not displayed]

MEDICATIONS:
Heparin [6H] units IV. No antibiotic was administered within 1 hour
of the procedure.

ANESTHESIA/SEDATION:
Versed 1 mg IV; Fentanyl 25 mcg IV

Moderate Sedation Time:  35 minutes

The patient was continuously monitored during the procedure by the
interventional radiology nurse under my direct supervision.

CONTRAST:  Omnipaque 300 approximately 65 mL.

FLUOROSCOPY TIME:  Fluoroscopy Time: 13 minutes 6 seconds ([6H]
mGy).

COMPLICATIONS:
None immediate.
The right forearm to the wrist was prepped and draped in usual
sterile manner. The right radial artery was then identified with
ultrasound, and its morphology documented permanently in the
radiology PACS system. A dorsal palmar anastomosis was verified to
be present. Using ultrasound guidance and micropuncture set access
into the right radial artery was obtained with a [DATE] French radial
sheath over a 0.018 inch micro guidewire. The guidewire and
obturator were removed. Good aspiration was obtained from the side
port of the radial sheath. A cocktail of [6H] units of heparin,
mg of verapamil, and 200 mcg of nitroglycerin was then infused in
diluted form through sheath without event. A right radial
arteriogram was performed. Over a 0.035 inch Roadrunner guidewire, a
5 EVGENEY 2 diagnostic catheter was then advanced to the
aortic arch region, selectively positioned in the origin of the
right vertebral artery, the right common carotid artery, the left
common carotid artery and the left vertebral artery.

A wrist band was then applied for hemostasis at the right radial
puncture site. Distal right radial pulse was verified to be present.
FINDINGS: The right vertebral artery origin demonstrates a severe stenosis.
More distally, the vessel is seen to opacify to the cranial skull
base. There is approximately 50% stenosis of the right
vertebrobasilar junction just proximal to the origin of the right
posterior-inferior cerebellar artery.

Distal to this contrast is seen in the basilar artery and the
posterior cerebral arteries. Mixed with unopacified blood is seen in
the basilar artery from the contralateral vertebral artery.

The right common carotid arteriogram demonstrates the right external
carotid artery and its major branches to be widely patent.

The right internal carotid artery has an eccentric smooth
atherosclerotic plaque along the lateral aspect just distal to the
bulb with approximately 20% stenosis by the NASCET criteria. More
distally, the vessel is seen to opacify to the cranial skull base.
Mild caliber irregularity probably related to arteriosclerosis is
seen of the cavernous segment of the right internal carotid artery.

The right middle cerebral artery and the right anterior cerebral
artery opacify into the capillary and venous phases.

Prompt opacification of the left anterior cerebral A2 segment and
distally is seen via anterior communicating artery. A small
infundibulum is evident in the right posterior communicating artery.

The left common carotid arteriogram demonstrates the left external
carotid artery and its major branches to be patent. Left internal
carotid artery at the bulb and its proximal [DATE] demonstrates slow
ascent of contrast to near complete occlusion. The delayed images
demonstrated ascent of contrast to the distal cervical left ICA.

More distally, reconstitution of the left internal carotid artery
and the supraclinoid segment is noted from left external carotid
artery branches via the ipsilateral ophthalmic artery.

Opacification of the right left middle cerebral artery distribution
is evident. No opacification of the left anterior cerebral artery is
evident.

The left vertebral artery origin is widely patent.

The vessel is seen to opacify to the cranial skull base. Patency is
seen of the left vertebrobasilar junction with a 50%-60% stenosis of
the left vertebrobasilar junction just proximal to the origin of the
left posterior-inferior cerebellar artery.

Mild caliber irregularity of the proximal PICA suggests intracranial
arteriosclerosis.

More distally, opacification is seen of the basilar artery, the
posterior cerebral arteries, the superior cerebellar arteries and
the anterior-inferior cerebellar arteries into the capillary and
venous phases. Unopacified blood is seen in the basilar artery from
the contralateral vertebral artery.
IMPRESSION: Angiographically occluded left internal carotid artery proximally
with evidence of a delayed string sign to the mid to the distal [DATE]
of the cervical left ICA.

Partial reconstitution of the left internal carotid artery
supraclinoid segment from the ipsilateral ophthalmic artery.

Non opacification of the left anterior cerebral artery A1 segment.

Severe stenosis at the origin of the right vertebral artery.

Approximately 50% to 60% stenosis of the dominant left
vertebrobasilar junction just proximal to the left
posterior-inferior cerebellar artery.

Approximately 50% stenosis of the right vertebrobasilar junction
proximal to the right posteroinferior cerebellar artery.

PLAN:
Finding discussed with the patient and the referring neurologist.

## 2022-01-16 MED ORDER — SODIUM CHLORIDE (PF) 0.9 % IJ SOLN
INTRAVENOUS | Status: AC | PRN
  Administered 2022-01-16: 200 ug via INTRA_ARTERIAL

## 2022-01-16 MED ORDER — IOHEXOL 300 MG/ML  SOLN
100.0000 mL | Freq: Once | INTRAMUSCULAR | Status: AC | PRN
  Administered 2022-01-18: 25 mL via INTRA_ARTERIAL

## 2022-01-16 MED ORDER — VERAPAMIL HCL 2.5 MG/ML IV SOLN: INTRA_ARTERIAL | Status: AC | PRN

## 2022-01-16 MED ORDER — SODIUM CHLORIDE 0.9 % IV SOLN: INTRAVENOUS | Status: DC

## 2022-01-16 MED ORDER — IOHEXOL 300 MG/ML  SOLN
100.0000 mL | Freq: Once | INTRAMUSCULAR | Status: AC | PRN
  Administered 2022-01-16: 15:00:00 60 mL via INTRA_ARTERIAL

## 2022-01-16 MED ORDER — VERAPAMIL HCL 2.5 MG/ML IV SOLN
INTRAVENOUS | Status: AC
  Filled 2022-01-16: qty 2

## 2022-01-16 MED ORDER — NITROGLYCERIN 1 MG/10 ML FOR IR/CATH LAB
INTRA_ARTERIAL | Status: AC
  Filled 2022-01-16: qty 10

## 2022-01-16 MED ORDER — LIDOCAINE HCL 1 % IJ SOLN
INTRAMUSCULAR | Status: AC
  Filled 2022-01-16: qty 20

## 2022-01-16 MED ORDER — FENTANYL CITRATE (PF) 100 MCG/2ML IJ SOLN
INTRAMUSCULAR | Status: AC | PRN
  Administered 2022-01-16: 25 ug via INTRAVENOUS

## 2022-01-16 MED ORDER — ATORVASTATIN CALCIUM 80 MG PO TABS: 80.0000 mg | ORAL_TABLET | Freq: Every day | ORAL | Status: DC

## 2022-01-16 MED ORDER — HEPARIN SODIUM (PORCINE) 1000 UNIT/ML IJ SOLN
INTRAMUSCULAR | Status: AC
  Filled 2022-01-16: qty 10

## 2022-01-16 MED ORDER — ATORVASTATIN CALCIUM 40 MG PO TABS
40.0000 mg | ORAL_TABLET | Freq: Every day | ORAL | Status: DC
  Administered 2022-01-16 – 2022-01-19 (×4): 40 mg via ORAL
  Filled 2022-01-16 (×4): qty 1

## 2022-01-16 MED ORDER — MIDAZOLAM HCL 2 MG/2ML IJ SOLN
INTRAMUSCULAR | Status: AC
  Filled 2022-01-16: qty 2

## 2022-01-16 MED ORDER — MIDAZOLAM HCL 2 MG/2ML IJ SOLN
INTRAMUSCULAR | Status: AC | PRN
  Administered 2022-01-16: 1 mg via INTRAVENOUS

## 2022-01-16 MED ORDER — FENTANYL CITRATE (PF) 100 MCG/2ML IJ SOLN
INTRAMUSCULAR | Status: AC
  Filled 2022-01-16: qty 2

## 2022-01-16 NOTE — Progress Notes (Signed)
SLP Cancellation Note  Patient Details Name: Caleb Simmons MRN: [id number] DOB: [birth year]   Cancelled treatment:       Reason Eval/Treat Not Completed: Patient at procedure or test/unavailable   Arvin Abello, Katherene Ponto 01/16/2022, 9:48 AM

## 2022-01-16 NOTE — Progress Notes (Signed)
°  Transition of Care [facility]) Screening Note   Patient Details  Name: Caleb Simmons Date of Birth: 12-13-1951   Transition of Care [facility]) CM/SW Contact:    Geralynn Ochs, LCSW Phone Number: 01/16/2022, 2:15 PM    Transition of Care Department [facility]) has reviewed patient and no TOC needs have been identified at this time. We will continue to monitor patient advancement through interdisciplinary progression rounds. If new patient transition needs arise, please place a TOC consult.

## 2022-01-16 NOTE — Procedures (Signed)
INR. 4 vessel cerebral arteriogram RT Rad approach. Findings. Occluded Lt ICA prox with a delayed  string sign.Partial reconstitution of RT ICA supraclinoid from RT ECA branches via RT Ophthalmic A Severe prox RT VA  origin.stenosis  3.Approx 50 % bilateral VBJ stenosis.  S.Keria Widrig MD

## 2022-01-16 NOTE — Progress Notes (Signed)
STROKE TEAM PROGRESS NOTE   SUBJECTIVE (INTERVAL HISTORY) His wife is at the bedside.  Echo tech is working on 2D echo.  Overall his condition is completely resolved.  Per patient, his right hand weakness and slurred speech has resolved.  Pending cerebral angiogram today.   OBJECTIVE Temp:  [97.1 F (36.2 C)-98.3 F (36.8 C)] 97.1 F (36.2 C) (01/25 0820) Pulse Rate:  [68-109] 87 (01/25 1545) Cardiac Rhythm: Normal sinus rhythm (01/25 1545) Resp:  [9-20] 14 (01/25 1545) BP: (90-150)/(60-97) 127/76 (01/25 1545) SpO2:  [93 %-100 %] 96 % (01/25 1545) Weight:  [74 kg] 74 kg (01/25 0820)  No results for input(s): GLUCAP in the last 168 hours. Recent Labs  Lab 01/10/22 0744 01/14/22 1342 01/14/22 1349 01/15/22 1514 01/16/22 0355  NA 138 136 135  --  133*  K 4.6 4.5 4.5  --  5.2*  CL 106 105 104  --  104  CO2 23 23  --   --  19*  GLUCOSE 125* 114* 110*  --  88  BUN 33* 32* 30*  --  38*  CREATININE 4.27* 3.68* 3.90*  --  4.73*  CALCIUM 8.6* 8.3*  --   --  8.5*  MG  --   --   --  1.9  --   PHOS  --   --   --  5.7*  --    Recent Labs  Lab 01/10/22 0744 01/14/22 1342 01/15/22 1514  AST 9* 14* 11*  ALT 7 12 11   ALKPHOS 95 86 72  BILITOT 0.3 0.6 0.3  PROT 5.7* 5.9* 5.0*  ALBUMIN 3.4* 3.2* 2.7*   Recent Labs  Lab 01/10/22 0744 01/14/22 1342 01/14/22 1349 01/16/22 0355  WBC 11.4* 6.6  --  6.7  NEUTROABS 8.7* 4.6  --   --   HGB 9.1* 9.4* 9.2* 8.5*  HCT 28.1* 28.7* 27.0* 25.7*  MCV 90.9 91.4  --  91.5  PLT 167 205  --  219   No results for input(s): CKTOTAL, CKMB, CKMBINDEX, TROPONINI in the last 168 hours. Recent Labs    01/14/22 1342 01/16/22 0355  LABPROT 13.2 13.4  INR 1.0 1.0   Recent Labs    01/14/22 1823  COLORURINE STRAW*  LABSPEC 1.010  PHURINE 8.0  GLUCOSEU 50*  HGBUR NEGATIVE  BILIRUBINUR NEGATIVE  KETONESUR NEGATIVE  PROTEINUR >=300*  NITRITE NEGATIVE  LEUKOCYTESUR NEGATIVE       Component Value Date/Time   CHOL 239 (H) 01/15/2022 1514    TRIG 535 (H) 01/15/2022 1514   HDL 28 (L) 01/15/2022 1514   CHOLHDL 8.5 01/15/2022 1514   VLDL UNABLE TO CALCULATE IF TRIGLYCERIDE OVER 400 mg/dL 01/15/2022 1514   LDLCALC UNABLE TO CALCULATE IF TRIGLYCERIDE OVER 400 mg/dL 01/15/2022 1514   Lab Results  Component Value Date   HGBA1C 5.6 01/15/2022      Component Value Date/Time   LABOPIA NONE DETECTED 01/14/2022 1823   COCAINSCRNUR NONE DETECTED 01/14/2022 1823   LABBENZ POSITIVE (A) 01/14/2022 1823   AMPHETMU NONE DETECTED 01/14/2022 1823   THCU NONE DETECTED 01/14/2022 1823   LABBARB NONE DETECTED 01/14/2022 1823    Recent Labs  Lab 01/14/22 1342  ETH <10    I have personally reviewed the radiological images below and agree with the radiology interpretations.  CT Angio Head W/Cm &/Or Wo Cm  Result Date: 01/14/2022 CLINICAL DATA:  Right hand weakness, right-sided facial droop and slurred speech EXAM: CT ANGIOGRAPHY HEAD AND NECK TECHNIQUE: Multidetector CT imaging  of the head and neck was performed using the standard protocol during bolus administration of intravenous contrast. Multiplanar CT image reconstructions and MIPs were obtained to evaluate the vascular anatomy. Carotid stenosis measurements (when applicable) are obtained utilizing NASCET criteria, using the distal internal carotid diameter as the denominator. RADIATION DOSE REDUCTION: This exam was performed according to the departmental dose-optimization program which includes automated exposure control, adjustment of the mA and/or kV according to patient size and/or use of iterative reconstruction technique. CONTRAST:  179mL OMNIPAQUE IOHEXOL 350 MG/ML SOLN COMPARISON:  01/14/2022 CT head and MRI FINDINGS: CT HEAD FINDINGS Brain: No evidence of acute hemorrhage, cerebral edema, mass, mass effect, or midline shift. No hydrocephalus or extra-axial fluid collection. Periventricular white matter changes, likely the sequela of chronic small vessel ischemic disease. The acute  infarcts seen on the same-day MRI are not significantly apparent on this CT. Vascular: No hyperdense vessel. Skull: Normal. Negative for fracture or focal lesion. Sinuses/Orbits: No acute finding. Other: The mastoid air cells are well aerated. CTA NECK FINDINGS Aortic arch: Standard branching. Imaged portion shows no evidence of aneurysm or dissection. No significant stenosis of the major arch vessel origins. Right carotid system: No evidence of dissection, stenosis (50% or greater) or occlusion. Left carotid system: Evaluation of the origin of the left common carotid artery is somewhat limited due to beam hardening artifact from the adjacent venous bolus, however there appears to be mild narrowing at the origin. 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 No distal reconstitution of the extracranial ICA. Vertebral arteries: Moderate narrowing at the origin of the right vertebral artery, secondary to noncalcified plaque. Focal moderate stenosis of the right V2 (series 12, image 248) at the level of C6, as well as in the right C5-C6 foramen, secondary to degenerative changes (series 12, image 242). The right vertebral artery is otherwise patent. The left vertebral artery demonstrates mild calcified narrowing in the V2 segment, but is otherwise patent. Codominant. Skeleton: Multilevel degenerative changes in the cervical spine, with trace retrolisthesis of C5 on C6. No acute osseous abnormality. Other neck: Negative. Upper chest: Dependent atelectasis. Review of the MIP images confirms the above findings CTA HEAD FINDINGS Evaluation is somewhat limited by degree of venous contamination. Anterior circulation: The left intracranial internal carotid artery is non-opacified until the ophthalmic and supraclinoid portions (series 12, image 111). Calcifications in the right intracranial internal carotid artery, which cause moderate narrowing in the distal  cavernous segment and proximal supraclinoid segment. Occlusion of the left A1 (series 12, image 100), with a small area of filling in the distal left A1, likely retrograde. Patent right A1. Normal anterior communicating artery. Anterior cerebral arteries are patent to their distal aspects. No M1 stenosis or occlusion. Normal MCA bifurcations. Distal MCA branches perfused and symmetric. Posterior circulation: Vertebral arteries patent to the vertebrobasilar junction, with mild calcifications and irregularity in the right V4 segment, prior to the PICA takeoff (series 12, image 145), which causes mild-to-moderate narrowing. Mild to moderate narrowing in the left V4, proximal to the left PICA takeoff (series 12, image 144). Posterior inferior cerebral arteries patent bilaterally. Basilar patent to its distal aspect. Superior cerebellar arteries patent bilaterally. Bilateral P1 segments originate from the basilar artery. PCAs perfused to their distal aspects, with mild narrowing in the left P1/P2 and poor opacification of the distal left P2 and P3 segments. The bilateral posterior communicating arteries are not definitively seen. Venous sinuses: As permitted by contrast  timing, patent. Anatomic variants: None significant Review of the MIP images confirms the above findings IMPRESSION: 1. 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 The left intracranial internal carotid artery reconstitutes in the ophthalmic and supraclinoid portions. 2. Moderate narrowing in the distal right cavernous ICA and proximal supraclinoid segment. 3. Occlusion of the left A1.  The left ACA is otherwise patent. 4. Moderate stenosis at the origin of the right vertebral artery, with additional focal moderate stenosis of the right V2 at the level of C6, both secondary to atherosclerotic plaque and degenerative changes. Mild-to-moderate stenosis of the bilateral V4 segments. 5. Poor opacification of the  distal left P2 and P3 segments. These results were called by telephone at the time of interpretation on 01/14/2022 at 8:48 pm to provider DR. DOUTOVA , who verbally acknowledged these results. Electronically Signed   By: Merilyn Baba M.D.   On: 01/14/2022 20:48   DG Chest 2 View  Result Date: 01/14/2022 CLINICAL DATA:  Facial droop EXAM: CHEST - 2 VIEW COMPARISON:  09/06/2021 FINDINGS: Right dialysis catheter remains in place, unchanged. Heart and mediastinal contours are within normal limits. No focal opacities or effusions. No acute bony abnormality. Old healed left rib fractures. IMPRESSION: No active cardiopulmonary disease. Electronically Signed   By: Rolm Baptise M.D.   On: 01/14/2022 19:34   CT HEAD WO CONTRAST  Result Date: 01/14/2022 CLINICAL DATA:  Facial droop and right arm weakness and numbness for the past 2 days. EXAM: CT HEAD WITHOUT CONTRAST TECHNIQUE: Contiguous axial images were obtained from the base of the skull through the vertex without intravenous contrast. RADIATION DOSE REDUCTION: This exam was performed according to the departmental dose-optimization program which includes automated exposure control, adjustment of the mA and/or kV according to patient size and/or use of iterative reconstruction technique. COMPARISON:  None. FINDINGS: Brain: Normal size and position of the ventricles. Minimal patchy white matter low density in both cerebral hemispheres. No intracranial hemorrhage, mass lesion or CT evidence of acute infarction. Vascular: No hyperdense vessel or unexpected calcification. Skull: Normal. Negative for fracture or focal lesion. Sinuses/Orbits: Unremarkable. Other: None. IMPRESSION: 1. No acute abnormality. 2. Minimal chronic small vessel white matter ischemic changes in both cerebral hemispheres. These make it difficult to exclude a small acute subcortical infarct. If this is a continued clinical concern, this could be further evaluated with an MRI of the brain.  Electronically Signed   By: Claudie Revering M.D.   On: 01/14/2022 14:17   CT Angio Neck W and/or Wo Contrast  Result Date: 01/14/2022 CLINICAL DATA:  Right hand weakness, right-sided facial droop and slurred speech EXAM: CT ANGIOGRAPHY HEAD AND NECK TECHNIQUE: Multidetector CT imaging of the head and neck was performed using the standard protocol during bolus administration of intravenous contrast. Multiplanar CT image reconstructions and MIPs were obtained to evaluate the vascular anatomy. Carotid stenosis measurements (when applicable) are obtained utilizing NASCET criteria, using the distal internal carotid diameter as the denominator. RADIATION DOSE REDUCTION: This exam was performed according to the departmental dose-optimization program which includes automated exposure control, adjustment of the mA and/or kV according to patient size and/or use of iterative reconstruction technique. CONTRAST:  170mL OMNIPAQUE IOHEXOL 350 MG/ML SOLN COMPARISON:  01/14/2022 CT head and MRI FINDINGS: CT HEAD FINDINGS Brain: No evidence of acute hemorrhage, cerebral edema, mass, mass effect, or midline shift. No hydrocephalus or extra-axial fluid collection. Periventricular white matter changes, likely the sequela of chronic small vessel ischemic disease.  The acute infarcts seen on the same-day MRI are not significantly apparent on this CT. Vascular: No hyperdense vessel. Skull: Normal. Negative for fracture or focal lesion. Sinuses/Orbits: No acute finding. Other: The mastoid air cells are well aerated. CTA NECK FINDINGS Aortic arch: Standard branching. Imaged portion shows no evidence of aneurysm or dissection. No significant stenosis of the major arch vessel origins. Right carotid system: No evidence of dissection, stenosis (50% or greater) or occlusion. Left carotid system: Evaluation of the origin of the left common carotid artery is somewhat limited due to beam hardening artifact from the adjacent venous bolus, however  there appears to be mild narrowing at the origin. 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 No distal reconstitution of the extracranial ICA. Vertebral arteries: Moderate narrowing at the origin of the right vertebral artery, secondary to noncalcified plaque. Focal moderate stenosis of the right V2 (series 12, image 248) at the level of C6, as well as in the right C5-C6 foramen, secondary to degenerative changes (series 12, image 242). The right vertebral artery is otherwise patent. The left vertebral artery demonstrates mild calcified narrowing in the V2 segment, but is otherwise patent. Codominant. Skeleton: Multilevel degenerative changes in the cervical spine, with trace retrolisthesis of C5 on C6. No acute osseous abnormality. Other neck: Negative. Upper chest: Dependent atelectasis. Review of the MIP images confirms the above findings CTA HEAD FINDINGS Evaluation is somewhat limited by degree of venous contamination. Anterior circulation: The left intracranial internal carotid artery is non-opacified until the ophthalmic and supraclinoid portions (series 12, image 111). Calcifications in the right intracranial internal carotid artery, which cause moderate narrowing in the distal cavernous segment and proximal supraclinoid segment. Occlusion of the left A1 (series 12, image 100), with a small area of filling in the distal left A1, likely retrograde. Patent right A1. Normal anterior communicating artery. Anterior cerebral arteries are patent to their distal aspects. No M1 stenosis or occlusion. Normal MCA bifurcations. Distal MCA branches perfused and symmetric. Posterior circulation: Vertebral arteries patent to the vertebrobasilar junction, with mild calcifications and irregularity in the right V4 segment, prior to the PICA takeoff (series 12, image 145), which causes mild-to-moderate narrowing. Mild to moderate narrowing in the left V4,  proximal to the left PICA takeoff (series 12, image 144). Posterior inferior cerebral arteries patent bilaterally. Basilar patent to its distal aspect. Superior cerebellar arteries patent bilaterally. Bilateral P1 segments originate from the basilar artery. PCAs perfused to their distal aspects, with mild narrowing in the left P1/P2 and poor opacification of the distal left P2 and P3 segments. The bilateral posterior communicating arteries are not definitively seen. Venous sinuses: As permitted by contrast timing, patent. Anatomic variants: None significant Review of the MIP images confirms the above findings IMPRESSION: 1. 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 The left intracranial internal carotid artery reconstitutes in the ophthalmic and supraclinoid portions. 2. Moderate narrowing in the distal right cavernous ICA and proximal supraclinoid segment. 3. Occlusion of the left A1.  The left ACA is otherwise patent. 4. Moderate stenosis at the origin of the right vertebral artery, with additional focal moderate stenosis of the right V2 at the level of C6, both secondary to atherosclerotic plaque and degenerative changes. Mild-to-moderate stenosis of the bilateral V4 segments. 5. Poor opacification of the distal left P2 and P3 segments. These results were called by telephone at the time of interpretation on 01/14/2022 at 8:48  pm to provider DR. DOUTOVA , who verbally acknowledged these results. Electronically Signed   By: Merilyn Baba M.D.   On: 01/14/2022 20:48   MR BRAIN WO CONTRAST  Result Date: 01/14/2022 CLINICAL DATA:  Neuro deficit, acute, stroke suspected. EXAM: MRI HEAD WITHOUT CONTRAST TECHNIQUE: Multiplanar, multiecho pulse sequences of the brain and surrounding structures were obtained without intravenous contrast. COMPARISON:  Head CT same day FINDINGS: Brain: Diffusion imaging shows multiple small acute infarctions within the left hemisphere  scattered within the MCA territory which could either be due to micro embolic infarctions or watershed infarctions. Micro embolic infarctions are favored. No large confluent infarction. No mass effect or swelling. Elsewhere, the brainstem and cerebellum are normal. Cerebral hemispheres show mild to moderate chronic small-vessel ischemic changes of the white matter. No mass lesion, hemorrhage, hydrocephalus or extra-axial collection. Vascular: Major vessels at the base of the brain show flow. 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 This pattern could be due to micro embolic infarction or watershed infarction. No large confluent infarction. No mass effect or hemorrhage. Moderate chronic small-vessel ischemic changes elsewhere affecting the cerebral hemispheric white matter. Electronically Signed   By: Nelson Chimes M.D.   On: 01/14/2022 17:58   DG Chest Port 1 View  Result Date: 01/15/2022 CLINICAL DATA:  Weakness. EXAM: PORTABLE CHEST 1 VIEW COMPARISON:  09/06/2021 FINDINGS: Dialysis catheter with tip at the upper cavoatrial junction. There is no edema, consolidation, effusion, or pneumothorax. Artifact from EKG leads. Normal heart size and mediastinal contours. IMPRESSION: No evidence of active disease. Electronically Signed   By: Jorje Guild M.D.   On: 01/15/2022 07:43   DG HIPS BILAT WITH PELVIS MIN 5 VIEWS  Result Date: 01/14/2022 CLINICAL DATA:  Bilateral hip pain EXAM: DG HIP (WITH OR WITHOUT PELVIS) 5+V BILAT COMPARISON:  None. FINDINGS: There is no evidence of hip fracture or dislocation. There is no evidence of arthropathy or other focal bone abnormality. IMPRESSION: Negative. Electronically Signed   By: Fidela Salisbury M.D.   On: 01/14/2022 21:57      PHYSICAL EXAM  Temp:  [97.1 F (36.2 C)-98.3 F (36.8  C)] 97.1 F (36.2 C) (01/25 0820) Pulse Rate:  [68-109] 87 (01/25 1545) Resp:  [9-20] 14 (01/25 1545) BP: (90-150)/(60-97) 127/76 (01/25 1545) SpO2:  [93 %-100 %] 96 % (01/25 1545) Weight:  [74 kg] 74 kg (01/25 0820)  General - Well nourished, well developed, in no apparent distress.  Ophthalmologic - fundi not visualized due to noncooperation.  Cardiovascular - Regular rhythm and rate.  Mental Status -  Level of arousal and orientation to time, place, and person were intact. Language including expression, naming, repetition, comprehension was assessed and found intact. Fund of Knowledge was assessed and was intact.  Cranial Nerves II - XII - II - Visual field intact OU. III, IV, VI - Extraocular movements intact. V - Facial sensation intact bilaterally. VII - Facial movement intact bilaterally. VIII - Hearing & vestibular intact bilaterally. X - Palate elevates symmetrically. XI - Chin turning & shoulder shrug intact bilaterally. XII - Tongue protrusion intact.  Motor Strength - The patients strength was normal in all extremities and pronator drift was absent.  Bulk was normal and fasciculations were absent.   Motor Tone - Muscle tone was assessed at the neck and appendages and was normal.  Reflexes - The patients reflexes were  symmetrical in all extremities and he had no pathological reflexes.  Sensory - Light touch, temperature/pinprick were assessed and were symmetrical.    Coordination - The patient had normal movements in the hands and feet with no ataxia or dysmetria.  Tremor was absent.  Gait and Station - deferred.   ASSESSMENT/PLAN Mr. MAKENA MCGRADY is a 71 y.o. 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 No tPA given due to OOW.    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  Left A1 occlusion. MRI left MCA scattered infarcts Cerebral angiogram pending 2D Echo EF 55 to 60% LDL 85.5, TG 535 HgbA1c pending SCDs for VTE prophylaxis No antithrombotic prior to admission, now on aspirin 325 mg daily and clopidogrel 75 mg daily.  Patient counseled to be compliant with his antithrombotic medications Ongoing aggressive stroke risk factor management Therapy recommendations: None Disposition: Pending  AL amyloidosis Follows with Dr. Burr Medico at cancer center On chemotherapy Has renal, heart and neuro involvement ESRD on hemodialysis Orthostatic hypotension on midodrine Hx of CHF and mild cardiomyopathy EKG showed frequent PACs but will double check with cardiology to rule out afib.  BP management Stable Avoid low BP Long term BP goal 130-150 given left ICA occlusion/high-grade stenosis  Hyperlipidemia Home meds: None LDL 85.5, goal < 70 Now on Lipitor 40 Continue statin at discharge  Tobacco abuse Current smoker Smoking cessation counseling provided Pt will consider to create  Other Stroke Risk Factors Advanced age  Other Active Problems Orthostatic hypotension on midodrine ESRD on HD Anemia of chronic disease, hemoglobin 9.4  Hospital day # 2    Rosalin Hawking, MD PhD Stroke Neurology 01/16/2022 3:59 PM    To contact Stroke Continuity provider, please refer to [email or web address] After hours, contact General Neurology

## 2022-01-16 NOTE — Progress Notes (Signed)
Echocardiogram 2D Echocardiogram has been performed.  [name] 01/16/2022, 1:38 PM

## 2022-01-16 NOTE — Progress Notes (Signed)
[facility] Progress Note  Subjective: seen on HD, grips bilat and Ox 3  Vitals:   01/16/22 1030 01/16/22 1100 01/16/22 1144 01/16/22 1147  BP: 137/74 134/84 (!) 150/86 (!) 150/97  Pulse: 82 93    Resp: 19 18 20 18   Temp:      TempSrc:      SpO2:      Weight:        Exam:  alert, nad   no jvd  Chest cta bilat  Cor reg no RG  Abd soft ntnd no ascites   Ext no LE edema   Alert, nonfocal, Ox 3  RIJ TDC intact      Home meds include - xanax prn, midodrine 5 tid, remeron, oxy IR prn, protonix, renvela 800 tid, flomax, ambien prn        OP HD: [facility] MWF   4h  400/1.5  76.5kg  2/2 bath  RIJ TDC  Hep none   - venofer 100 x 10 (9 left)   - calcitriol 0.25 ug po tiw      Alb 3.2  Ca 8.3  tsat 47%     Hb 9.2       CXR 1/24 - IMPRESSION: No evidence of active disease.       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 Diagnostic angiogram to be scheduled while here. Telemetry monitoring. Neurology recs.  ESRD - on HD MWF. Has not missed HD. HD today.  HTN/ vol - BP's low-normal, cont midodrine 5 tid. No vol excess on exam. At dry wt.   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 Tsat good at 47%.  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         Rob Ercel Pepitone 01/16/2022, 1:21 PM   Recent Labs  Lab 01/14/22 1342 01/14/22 1349 01/15/22 1514 01/16/22 0355  K 4.5 4.5  --  5.2*  BUN 32* 30*  --  38*  CREATININE 3.68* 3.90*  --  4.73*  CALCIUM 8.3*  --   --  8.5*  PHOS  --   --  5.7*  --   HGB 9.4* 9.2*  --  8.5*   Inpatient medications:   stroke: mapping our early stages of recovery book   Does not apply Once   aspirin EC  325 mg Oral Daily   atorvastatin  80 mg Oral QHS   calcitRIOL  0.25 mcg Oral Q M,W,F-HD   Chlorhexidine Gluconate Cloth  6 each Topical Q0600   clopidogrel  75 mg Oral Daily   folic acid  2 mg Oral q AM    midodrine  5 mg Oral TID WC   mirtazapine  30 mg Oral QHS   pantoprazole  40 mg Oral BID AC   sevelamer carbonate  800 mg Oral TID WC   tamsulosin  0.4 mg Oral q AM    acetaminophen **OR** acetaminophen (TYLENOL) oral liquid 160 mg/5 mL **OR** acetaminophen, ALPRAZolam, ALPRAZolam, HYDROmorphone (DILAUDID) injection, oxyCODONE

## 2022-01-16 NOTE — Sedation Documentation (Signed)
Pt transported to 3W10 via bed accompanied by RN. Rebekkah at bedside to receive pt. Handoff complete. Right wrist level 0 with TR band in place. Pt awake alert and oriented. No s/s of distress at this time.

## 2022-01-16 NOTE — Progress Notes (Signed)
PROGRESS NOTE    DEMARCO Simmons  DPO:[id number] DOB: 05/27/1951 DOA: 01/14/2022 PCP: Merryl Hacker, No    Brief Narrative:  Caleb Simmons is a 71 year old male with past medical history significant for amyloidosis, plasma cell myeloma, ESRD on HD MWF, tobacco use disorder, chronic combined systolic/diastolic congestive heart failure who presents to [facility] ED on 1/23 with facial droop and slurred speech.  Work-up revealed acute left MCA territory infarct, likely watershed from hypoperfusion in the setting of left ICA stenosis.  Patient was transferred to [facility] on 01/15/2022 for neurology evaluation.   Assessment & Plan:   Principal Problem:   Acute ischemic left MCA stroke (HCC) Active Problems:   AL amyloidosis (HCC)   Malnutrition of moderate degree   Port-A-Cath in place   ESRD (end stage renal disease) (HCC)   BPH (benign prostatic hyperplasia)   Tobacco abuse   Anemia due to chronic kidney disease   PAD (peripheral artery disease) (HCC)   Acute ischemic left MCA CVA, POA Patient presenting to ED with dysarthria, facial droop and right-sided weakness.  MR brain with left MCA territory multiple infarcts.  CT angiogram head with complete occlusion of the left ICA as well as right ICA narrowing, occlusion of the left A1, moderate stenosis of the right vertebral artery.  Possible watershed infarct due to hypoperfusion in the setting of bilateral PAD and complete total occlusion of the left ICA. --Neurology following, appreciate assistance --LDL unable to calculate given elevated triglycerides, HDL 28, triglycerides 535 --Hemoglobin A1c: Pending --TTE: Pending --PT/OT with no recommendations --SLP evaluation: Pending --Aspirin 325 mg p.o. daily, Plavix 75 mg p.o. daily --Atorvastatin 80 mg p.o. daily --Continue monitor on telemetry  ESRD on HD MWF --Nephrology following, appreciate assistance --HD today  AL amyloidosis Plasma cell myeloma Follows with medical oncology  outpatient, Dr. Burr Medico.  Currently on chemotherapy with weekly oral Cytoxan, dexa and Velcade injection starting 09/10/21 and daratumumab injection 09/27/21. Plan 6 month treatment. -- Outpatient follow-up with oncology  Dyslipidemia/hypertriglyceridemia Lipid panel with HDL 28, LDL unable to calculate, total cholesterol 239, triglycerides 535. --Given ERSD, unable to order Tricor/gemfibrozil; may benefit from lipid clinic evaluation, consideration of Vascepa --Start atorvastatin 80 mg p.o. daily  Chronic orthostatic hypotension: Midodrine 5 mg p.o. 3 times daily  BPH: Tamsulosin 0.4 mg p.o. daily  PAD CTA shows complete occlusion left ICA, narrowing distal ICA on right, occlusion of left A1, moderate stenosis of right vertebral artery. --Neuro IR following, plan cerebral angiogram today --Continue aspirin and Plavix  Anemia secondary to chronic renal disease Hemoglobin 8.5, stable.  Anemia panel with iron 145, TIBC 308, ferritin 163, folate 77.1, B12 322.  Tobacco use disorder Counseled on need for complete cessation.  GERD: Continue PPI  Anxiety: Continue Xanax  Malnutrition of moderate degree Body mass index is 23.41 kg/m. --Dietitian consult --Remeron 30 mg p.o. nightly   DVT prophylaxis: SCD's Start: 01/14/22 2013   Code Status: Full Code Family Communication: Updated friend present at bedside this morning.  Disposition Plan:  Level of care: Telemetry Medical Status is: Inpatient  Remains inpatient appropriate because: Pending cerebral angiogram, further per neurology anticipate discharge home in 1-2 days    Consultants:  Neurology Neuro IR Nephrology  Procedures:  TTE: Pending Cerebral angiogram: Pending  Antimicrobials:  None   Subjective: Patient seen examined at bedside, resting comfortably.  Updated friend in patient's room this morning.  Currently in hemodialysis, complaining of left hip discomfort which is chronic.  Discussed pending cerebral  angiogram.  States weakness has resolved as well as dysarthria.  No other specific complaints or concerns at this time.  Denies headache, no chest pain, no palpitations, no shortness of breath, no abdominal pain, no fever/chills/night sweats, no nausea/vomiting/diarrhea.  No acute concerns overnight per staff.  Objective: Vitals:   01/16/22 1030 01/16/22 1100 01/16/22 1144 01/16/22 1147  BP: 137/74 134/84 (!) 150/86 (!) 150/97  Pulse: 82 93    Resp: 19 18 20 18   Temp:      TempSrc:      SpO2:      Weight:        Intake/Output Summary (Last 24 hours) at 01/16/2022 1250 Last data filed at 01/16/2022 1144 Gross per 24 hour  Intake --  Output 1000 ml  Net -1000 ml   Filed Weights   01/16/22 0820  Weight: 74 kg    Examination:  General exam: Appears calm and comfortable, chronically ill in appearance Respiratory system: Clear to auscultation. Respiratory effort normal.  On room air Cardiovascular system: S1 & S2 heard, RRR. No JVD, murmurs, rubs, gallops or clicks. No pedal edema. Gastrointestinal system: Abdomen is nondistended, soft and nontender. No organomegaly or masses felt. Normal bowel sounds heard. Central nervous system: Alert and oriented. No focal neurological deficits. Extremities: Symmetric 5 x 5 power. Skin: No rashes, lesions or ulcers Psychiatry: Judgement and insight appear normal. Mood & affect appropriate.     Data Reviewed: I have personally reviewed following labs and imaging studies  CBC: Recent Labs  Lab 01/10/22 0744 01/14/22 1342 01/14/22 1349 01/16/22 0355  WBC 11.4* 6.6  --  6.7  NEUTROABS 8.7* 4.6  --   --   HGB 9.1* 9.4* 9.2* 8.5*  HCT 28.1* 28.7* 27.0* 25.7*  MCV 90.9 91.4  --  91.5  PLT 167 205  --  478   Basic Metabolic Panel: Recent Labs  Lab 01/10/22 0744 01/14/22 1342 01/14/22 1349 01/15/22 1514 01/16/22 0355  NA 138 136 135  --  133*  K 4.6 4.5 4.5  --  5.2*  CL 106 105 104  --  104  CO2 23 23  --   --  19*  GLUCOSE 125*  114* 110*  --  88  BUN 33* 32* 30*  --  38*  CREATININE 4.27* 3.68* 3.90*  --  4.73*  CALCIUM 8.6* 8.3*  --   --  8.5*  MG  --   --   --  1.9  --   PHOS  --   --   --  5.7*  --    GFR: Estimated Creatinine Clearance: 14.8 mL/min (A) (by C-G formula based on SCr of 4.73 mg/dL (H)). Liver Function Tests: Recent Labs  Lab 01/10/22 0744 01/14/22 1342 01/15/22 1514  AST 9* 14* 11*  ALT 7 12 11   ALKPHOS 95 86 72  BILITOT 0.3 0.6 0.3  PROT 5.7* 5.9* 5.0*  ALBUMIN 3.4* 3.2* 2.7*   No results for input(s): LIPASE, AMYLASE in the last 168 hours. No results for input(s): AMMONIA in the last 168 hours. Coagulation Profile: Recent Labs  Lab 01/14/22 1342 01/16/22 0355  INR 1.0 1.0   Cardiac Enzymes: No results for input(s): CKTOTAL, CKMB, CKMBINDEX, TROPONINI in the last 168 hours. BNP (last 3 results) No results for input(s): PROBNP in the last 8760 hours. HbA1C: Recent Labs    01/15/22 0333  HGBA1C 5.6   CBG: No results for input(s): GLUCAP in the last 168 hours. Lipid Profile: Recent Labs  01/15/22 1514  CHOL 239*  HDL 28*  LDLCALC UNABLE TO CALCULATE IF TRIGLYCERIDE OVER 400 mg/dL  TRIG 535*  CHOLHDL 8.5  LDLDIRECT 85.5   Thyroid Function Tests: No results for input(s): TSH, T4TOTAL, FREET4, T3FREE, THYROIDAB in the last 72 hours. Anemia Panel: Recent Labs    01/14/22 1342 01/14/22 1930  VITAMINB12  --  322  FOLATE  --  77.1  FERRITIN  --  163  TIBC  --  308  IRON  --  145  RETICCTPCT 0.9  --    Sepsis Labs: No results for input(s): PROCALCITON, LATICACIDVEN in the last 168 hours.  Recent Results (from the past 240 hour(s))  Resp Panel by RT-PCR (Flu A&B, Covid) Nasopharyngeal Swab     Status: None   Collection Time: 01/14/22  1:43 PM   Specimen: Nasopharyngeal Swab; Nasopharyngeal(NP) swabs in vial transport medium  Result Value Ref Range Status   SARS Coronavirus 2 by RT PCR NEGATIVE NEGATIVE Final    Comment: (NOTE) SARS-CoV-2 target nucleic  acids are NOT DETECTED.  The SARS-CoV-2 RNA is generally detectable in upper respiratory specimens during the acute phase of infection. The lowest concentration of SARS-CoV-2 viral copies this assay can detect is 138 copies/mL. A negative result does not preclude SARS-Cov-2 infection and should not be used as the sole basis for treatment or other patient management decisions. A negative result may occur with  improper specimen collection/handling, submission of specimen other than nasopharyngeal swab, presence of viral mutation(s) within the areas targeted by this assay, and inadequate number of viral copies(<138 copies/mL). A negative result must be combined with clinical observations, patient history, and epidemiological information. The expected result is Negative.  Fact Sheet for Patients:  EntrepreneurPulse.com.au  Fact Sheet for Healthcare Providers:  IncredibleEmployment.be  This test is no t yet approved or cleared by the [place] FDA and  has been authorized for detection and/or diagnosis of SARS-CoV-2 by FDA under an Emergency Use Authorization (EUA). This EUA will remain  in effect (meaning this test can be used) for the duration of the COVID-19 declaration under Section 564(b)(1) of the Act, 21 U.S.C.section 360bbb-3(b)(1), unless the authorization is terminated  or revoked sooner.       Influenza A by PCR NEGATIVE NEGATIVE Final   Influenza B by PCR NEGATIVE NEGATIVE Final    Comment: (NOTE) The Xpert Xpress SARS-CoV-2/FLU/RSV plus assay is intended as an aid in the diagnosis of influenza from Nasopharyngeal swab specimens and should not be used as a sole basis for treatment. Nasal washings and aspirates are unacceptable for Xpert Xpress SARS-CoV-2/FLU/RSV testing.  Fact Sheet for Patients: EntrepreneurPulse.com.au  Fact Sheet for Healthcare Providers: IncredibleEmployment.be  This  test is not yet approved or cleared by the [place] FDA and has been authorized for detection and/or diagnosis of SARS-CoV-2 by FDA under an Emergency Use Authorization (EUA). This EUA will remain in effect (meaning this test can be used) for the duration of the COVID-19 declaration under Section 564(b)(1) of the Act, 21 U.S.C. section 360bbb-3(b)(1), unless the authorization is terminated or revoked.  Performed at [facility], [place] [address], [place], [place] [address]          Radiology Studies: CT Angio Head W/Cm &/Or Wo Cm  Result Date: 01/14/2022 CLINICAL DATA:  Right hand weakness, right-sided facial droop and slurred speech EXAM: CT ANGIOGRAPHY HEAD AND NECK TECHNIQUE: Multidetector CT imaging of the head and neck was performed using the standard protocol during bolus administration  of intravenous contrast. Multiplanar CT image reconstructions and MIPs were obtained to evaluate the vascular anatomy. Carotid stenosis measurements (when applicable) are obtained utilizing NASCET criteria, using the distal internal carotid diameter as the denominator. RADIATION DOSE REDUCTION: This exam was performed according to the departmental dose-optimization program which includes automated exposure control, adjustment of the mA and/or kV according to patient size and/or use of iterative reconstruction technique. CONTRAST:  145mL OMNIPAQUE IOHEXOL 350 MG/ML SOLN COMPARISON:  01/14/2022 CT head and MRI FINDINGS: CT HEAD FINDINGS Brain: No evidence of acute hemorrhage, cerebral edema, mass, mass effect, or midline shift. No hydrocephalus or extra-axial fluid collection. Periventricular white matter changes, likely the sequela of chronic small vessel ischemic disease. The acute infarcts seen on the same-day MRI are not significantly apparent on this CT. Vascular: No hyperdense vessel. Skull: Normal. Negative for fracture or focal lesion. Sinuses/Orbits: No acute finding. Other: The  mastoid air cells are well aerated. CTA NECK FINDINGS Aortic arch: Standard branching. Imaged portion shows no evidence of aneurysm or dissection. No significant stenosis of the major arch vessel origins. Right carotid system: No evidence of dissection, stenosis (50% or greater) or occlusion. Left carotid system: Evaluation of the origin of the left common carotid artery is somewhat limited due to beam hardening artifact from the adjacent venous bolus, however there appears to be mild narrowing at the origin. 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 No distal reconstitution of the extracranial ICA. Vertebral arteries: Moderate narrowing at the origin of the right vertebral artery, secondary to noncalcified plaque. Focal moderate stenosis of the right V2 (series 12, image 248) at the level of C6, as well as in the right C5-C6 foramen, secondary to degenerative changes (series 12, image 242). The right vertebral artery is otherwise patent. The left vertebral artery demonstrates mild calcified narrowing in the V2 segment, but is otherwise patent. Codominant. Skeleton: Multilevel degenerative changes in the cervical spine, with trace retrolisthesis of C5 on C6. No acute osseous abnormality. Other neck: Negative. Upper chest: Dependent atelectasis. Review of the MIP images confirms the above findings CTA HEAD FINDINGS Evaluation is somewhat limited by degree of venous contamination. Anterior circulation: The left intracranial internal carotid artery is non-opacified until the ophthalmic and supraclinoid portions (series 12, image 111). Calcifications in the right intracranial internal carotid artery, which cause moderate narrowing in the distal cavernous segment and proximal supraclinoid segment. Occlusion of the left A1 (series 12, image 100), with a small area of filling in the distal left A1, likely retrograde. Patent right A1. Normal anterior  communicating artery. Anterior cerebral arteries are patent to their distal aspects. No M1 stenosis or occlusion. Normal MCA bifurcations. Distal MCA branches perfused and symmetric. Posterior circulation: Vertebral arteries patent to the vertebrobasilar junction, with mild calcifications and irregularity in the right V4 segment, prior to the PICA takeoff (series 12, image 145), which causes mild-to-moderate narrowing. Mild to moderate narrowing in the left V4, proximal to the left PICA takeoff (series 12, image 144). Posterior inferior cerebral arteries patent bilaterally. Basilar patent to its distal aspect. Superior cerebellar arteries patent bilaterally. Bilateral P1 segments originate from the basilar artery. PCAs perfused to their distal aspects, with mild narrowing in the left P1/P2 and poor opacification of the distal left P2 and P3 segments. The bilateral posterior communicating arteries are not definitively seen. Venous sinuses: As permitted by contrast timing, patent. Anatomic variants: None significant Review of the MIP images confirms the above  findings IMPRESSION: 1. 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 The left intracranial internal carotid artery reconstitutes in the ophthalmic and supraclinoid portions. 2. Moderate narrowing in the distal right cavernous ICA and proximal supraclinoid segment. 3. Occlusion of the left A1.  The left ACA is otherwise patent. 4. Moderate stenosis at the origin of the right vertebral artery, with additional focal moderate stenosis of the right V2 at the level of C6, both secondary to atherosclerotic plaque and degenerative changes. Mild-to-moderate stenosis of the bilateral V4 segments. 5. Poor opacification of the distal left P2 and P3 segments. These results were called by telephone at the time of interpretation on 01/14/2022 at 8:48 pm to provider DR. DOUTOVA , who verbally acknowledged these results. Electronically  Signed   By: Merilyn Baba M.D.   On: 01/14/2022 20:48   DG Chest 2 View  Result Date: 01/14/2022 CLINICAL DATA:  Facial droop EXAM: CHEST - 2 VIEW COMPARISON:  09/06/2021 FINDINGS: Right dialysis catheter remains in place, unchanged. Heart and mediastinal contours are within normal limits. No focal opacities or effusions. No acute bony abnormality. Old healed left rib fractures. IMPRESSION: No active cardiopulmonary disease. Electronically Signed   By: Rolm Baptise M.D.   On: 01/14/2022 19:34   CT HEAD WO CONTRAST  Result Date: 01/14/2022 CLINICAL DATA:  Facial droop and right arm weakness and numbness for the past 2 days. EXAM: CT HEAD WITHOUT CONTRAST TECHNIQUE: Contiguous axial images were obtained from the base of the skull through the vertex without intravenous contrast. RADIATION DOSE REDUCTION: This exam was performed according to the departmental dose-optimization program which includes automated exposure control, adjustment of the mA and/or kV according to patient size and/or use of iterative reconstruction technique. COMPARISON:  None. FINDINGS: Brain: Normal size and position of the ventricles. Minimal patchy white matter low density in both cerebral hemispheres. No intracranial hemorrhage, mass lesion or CT evidence of acute infarction. Vascular: No hyperdense vessel or unexpected calcification. Skull: Normal. Negative for fracture or focal lesion. Sinuses/Orbits: Unremarkable. Other: None. IMPRESSION: 1. No acute abnormality. 2. Minimal chronic small vessel white matter ischemic changes in both cerebral hemispheres. These make it difficult to exclude a small acute subcortical infarct. If this is a continued clinical concern, this could be further evaluated with an MRI of the brain. Electronically Signed   By: Claudie Revering M.D.   On: 01/14/2022 14:17   CT Angio Neck W and/or Wo Contrast  Result Date: 01/14/2022 CLINICAL DATA:  Right hand weakness, right-sided facial droop and slurred speech  EXAM: CT ANGIOGRAPHY HEAD AND NECK TECHNIQUE: Multidetector CT imaging of the head and neck was performed using the standard protocol during bolus administration of intravenous contrast. Multiplanar CT image reconstructions and MIPs were obtained to evaluate the vascular anatomy. Carotid stenosis measurements (when applicable) are obtained utilizing NASCET criteria, using the distal internal carotid diameter as the denominator. RADIATION DOSE REDUCTION: This exam was performed according to the departmental dose-optimization program which includes automated exposure control, adjustment of the mA and/or kV according to patient size and/or use of iterative reconstruction technique. CONTRAST:  113mL OMNIPAQUE IOHEXOL 350 MG/ML SOLN COMPARISON:  01/14/2022 CT head and MRI FINDINGS: CT HEAD FINDINGS Brain: No evidence of acute hemorrhage, cerebral edema, mass, mass effect, or midline shift. No hydrocephalus or extra-axial fluid collection. Periventricular white matter changes, likely the sequela of chronic small vessel ischemic disease. The acute infarcts seen on the same-day MRI are not significantly apparent on this  CT. Vascular: No hyperdense vessel. Skull: Normal. Negative for fracture or focal lesion. Sinuses/Orbits: No acute finding. Other: The mastoid air cells are well aerated. CTA NECK FINDINGS Aortic arch: Standard branching. Imaged portion shows no evidence of aneurysm or dissection. No significant stenosis of the major arch vessel origins. Right carotid system: No evidence of dissection, stenosis (50% or greater) or occlusion. Left carotid system: Evaluation of the origin of the left common carotid artery is somewhat limited due to beam hardening artifact from the adjacent venous bolus, however there appears to be mild narrowing at the origin. 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 No distal reconstitution of the extracranial ICA.  Vertebral arteries: Moderate narrowing at the origin of the right vertebral artery, secondary to noncalcified plaque. Focal moderate stenosis of the right V2 (series 12, image 248) at the level of C6, as well as in the right C5-C6 foramen, secondary to degenerative changes (series 12, image 242). The right vertebral artery is otherwise patent. The left vertebral artery demonstrates mild calcified narrowing in the V2 segment, but is otherwise patent. Codominant. Skeleton: Multilevel degenerative changes in the cervical spine, with trace retrolisthesis of C5 on C6. No acute osseous abnormality. Other neck: Negative. Upper chest: Dependent atelectasis. Review of the MIP images confirms the above findings CTA HEAD FINDINGS Evaluation is somewhat limited by degree of venous contamination. Anterior circulation: The left intracranial internal carotid artery is non-opacified until the ophthalmic and supraclinoid portions (series 12, image 111). Calcifications in the right intracranial internal carotid artery, which cause moderate narrowing in the distal cavernous segment and proximal supraclinoid segment. Occlusion of the left A1 (series 12, image 100), with a small area of filling in the distal left A1, likely retrograde. Patent right A1. Normal anterior communicating artery. Anterior cerebral arteries are patent to their distal aspects. No M1 stenosis or occlusion. Normal MCA bifurcations. Distal MCA branches perfused and symmetric. Posterior circulation: Vertebral arteries patent to the vertebrobasilar junction, with mild calcifications and irregularity in the right V4 segment, prior to the PICA takeoff (series 12, image 145), which causes mild-to-moderate narrowing. Mild to moderate narrowing in the left V4, proximal to the left PICA takeoff (series 12, image 144). Posterior inferior cerebral arteries patent bilaterally. Basilar patent to its distal aspect. Superior cerebellar arteries patent bilaterally. Bilateral P1  segments originate from the basilar artery. PCAs perfused to their distal aspects, with mild narrowing in the left P1/P2 and poor opacification of the distal left P2 and P3 segments. The bilateral posterior communicating arteries are not definitively seen. Venous sinuses: As permitted by contrast timing, patent. Anatomic variants: None significant Review of the MIP images confirms the above findings IMPRESSION: 1. 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 The left intracranial internal carotid artery reconstitutes in the ophthalmic and supraclinoid portions. 2. Moderate narrowing in the distal right cavernous ICA and proximal supraclinoid segment. 3. Occlusion of the left A1.  The left ACA is otherwise patent. 4. Moderate stenosis at the origin of the right vertebral artery, with additional focal moderate stenosis of the right V2 at the level of C6, both secondary to atherosclerotic plaque and degenerative changes. Mild-to-moderate stenosis of the bilateral V4 segments. 5. Poor opacification of the distal left P2 and P3 segments. These results were called by telephone at the time of interpretation on 01/14/2022 at 8:48 pm to provider DR. DOUTOVA , who verbally acknowledged these results. Electronically Signed  By: Merilyn Baba M.D.   On: 01/14/2022 20:48   MR BRAIN WO CONTRAST  Result Date: 01/14/2022 CLINICAL DATA:  Neuro deficit, acute, stroke suspected. EXAM: MRI HEAD WITHOUT CONTRAST TECHNIQUE: Multiplanar, multiecho pulse sequences of the brain and surrounding structures were obtained without intravenous contrast. COMPARISON:  Head CT same day FINDINGS: Brain: Diffusion imaging shows multiple small acute infarctions within the left hemisphere scattered within the MCA territory which could either be due to micro embolic infarctions or watershed infarctions. Micro embolic infarctions are favored. No large confluent infarction. No mass effect or swelling.  Elsewhere, the brainstem and cerebellum are normal. Cerebral hemispheres show mild to moderate chronic small-vessel ischemic changes of the white matter. No mass lesion, hemorrhage, hydrocephalus or extra-axial collection. Vascular: Major vessels at the base of the brain show flow. 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 This pattern could be due to micro embolic infarction or watershed infarction. No large confluent infarction. No mass effect or hemorrhage. Moderate chronic small-vessel ischemic changes elsewhere affecting the cerebral hemispheric white matter. Electronically Signed   By: Nelson Chimes M.D.   On: 01/14/2022 17:58   DG Chest Port 1 View  Result Date: 01/15/2022 CLINICAL DATA:  Weakness. EXAM: PORTABLE CHEST 1 VIEW COMPARISON:  09/06/2021 FINDINGS: Dialysis catheter with tip at the upper cavoatrial junction. There is no edema, consolidation, effusion, or pneumothorax. Artifact from EKG leads. Normal heart size and mediastinal contours. IMPRESSION: No evidence of active disease. Electronically Signed   By: Jorje Guild M.D.   On: 01/15/2022 07:43   DG HIPS BILAT WITH PELVIS MIN 5 VIEWS  Result Date: 01/14/2022 CLINICAL DATA:  Bilateral hip pain EXAM: DG HIP (WITH OR WITHOUT PELVIS) 5+V BILAT COMPARISON:  None. FINDINGS: There is no evidence of hip fracture or dislocation. There is no evidence of arthropathy or other focal bone abnormality. IMPRESSION: Negative. Electronically Signed   By: Fidela Salisbury M.D.   On: 01/14/2022 21:57        Scheduled Meds:   stroke: mapping our early stages of recovery book   Does not apply Once   aspirin EC  325 mg Oral Daily   calcitRIOL  0.25 mcg Oral Q M,W,F-HD   Chlorhexidine Gluconate Cloth  6 each Topical Q0600   clopidogrel  75 mg Oral Daily   folic acid   2 mg Oral q AM   midodrine  5 mg Oral TID WC   mirtazapine  30 mg Oral QHS   pantoprazole  40 mg Oral BID AC   sevelamer carbonate  800 mg Oral TID WC   tamsulosin  0.4 mg Oral q AM   Continuous Infusions:   LOS: 2 days    Time spent: 46 minutes spent on chart review, discussion with nursing staff, consultants, updating family and interview/physical exam; more than 50% of that time was spent in counseling and/or coordination of care.    Corney Knighton J [place] DO [facility] Available via Epic secure chat 7am-7pm After these hours, please refer to coverage provider listed on [email or web address] 01/16/2022, 12:50 PM

## 2022-01-17 ENCOUNTER — Inpatient Hospital Stay: Payer: Commercial Managed Care - PPO

## 2022-01-17 ENCOUNTER — Other Ambulatory Visit: Payer: Commercial Managed Care - PPO

## 2022-01-17 ENCOUNTER — Inpatient Hospital Stay: Payer: Commercial Managed Care - PPO | Admitting: Hematology

## 2022-01-17 ENCOUNTER — Other Ambulatory Visit: Payer: Self-pay | Admitting: Radiology

## 2022-01-17 ENCOUNTER — Ambulatory Visit: Payer: Commercial Managed Care - PPO

## 2022-01-17 ENCOUNTER — Other Ambulatory Visit: Payer: Self-pay | Admitting: Internal Medicine

## 2022-01-17 ENCOUNTER — Other Ambulatory Visit (HOSPITAL_COMMUNITY): Payer: Self-pay

## 2022-01-17 DIAGNOSIS — I771 Stricture of artery: Secondary | ICD-10-CM

## 2022-01-17 DIAGNOSIS — I6522 Occlusion and stenosis of left carotid artery: Secondary | ICD-10-CM

## 2022-01-17 LAB — HEMOGLOBIN A1C
Hgb A1c MFr Bld: 5.6 % (ref 4.8–5.6)
Mean Plasma Glucose: 114 mg/dL

## 2022-01-17 MED ORDER — TICAGRELOR 90 MG PO TABS
90.0000 mg | ORAL_TABLET | Freq: Two times a day (BID) | ORAL | Status: DC
  Administered 2022-01-18: 90 mg via ORAL
  Filled 2022-01-17: qty 1

## 2022-01-17 MED ORDER — TICAGRELOR 90 MG PO TABS
180.0000 mg | ORAL_TABLET | Freq: Once | ORAL | Status: AC
  Administered 2022-01-17: 180 mg via ORAL
  Filled 2022-01-17: qty 2

## 2022-01-17 NOTE — Evaluation (Signed)
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 No pertinent past medical history. Past Surgical History:  Past Surgical History:  Procedure Laterality Date   APPENDECTOMY     IR ANGIO INTRA EXTRACRAN SEL COM CAROTID INNOMINATE BILAT MOD SED  01/16/2022   IR ANGIO VERTEBRAL SEL SUBCLAVIAN INNOMINATE BILAT MOD SED  01/16/2022   IR EMBO ART  VEN HEMORR LYMPH EXTRAV  INC GUIDE ROADMAPPING  08/30/2021   IR FLUORO GUIDE CV LINE RIGHT  08/30/2021   IR FLUORO GUIDE CV LINE RIGHT  09/18/2021   IR RENAL SELECTIVE  UNI INC S&I MOD SED  08/31/2021   IR US GUIDE VASC ACCESS RIGHT  08/30/2021   IR US GUIDE VASC ACCESS RIGHT  08/30/2021   IR US GUIDE VASC ACCESS RIGHT  09/18/2021   IR US GUIDE VASC ACCESS RIGHT  01/16/2022   HPI:  Pt is a 71 year old male admitted with slurred speech and right side weakness. + for Numerous punctate infarctions scattered within the left MCA  territory including the cortex, subcortical white matter and basal ganglia. 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   Assessment / Plan / Recommendation Clinical Impression  Pt participated in  speech/language/cognition evaluation with his caregiver present who reported acute changes in thinking and word retrieval. Pt and his caregiver stated that there has been intermittent confusion which has been worse than at baseline. Motor speech skills were WNL. Mild aphasia was noted with intermittent word retrieval difficulty during conversation and difficulty with auditory comprehension of larger amounts of or more complex information. [facility] Mental Status Examination was completed to evaluate the pt's cognitive-linguistic skills due to some difficulty noted during screening. However, the impact of language difficulty on his performance is considered. He achieved a score of 14/30 which is below the normal limits of 27 or more out of 30. He exhibited difficulty in the areas of awareness, attention, memory, and executive function. Skilled SLP services are clinically indicated at this time.    SLP Assessment  SLP Recommendation/Assessment: Patient needs continued Speech Lanaguage Pathology Services SLP Visit Diagnosis: Aphasia (R47.01);Cognitive communication deficit (R41.841)    Recommendations for follow up therapy are one component of a multi-disciplinary discharge planning process, led by the attending physician.  Recommendations may be updated based on patient status, additional functional criteria and insurance authorization.    Follow Up Recommendations  Outpatient SLP    Assistance Recommended at Discharge  Intermittent Supervision/Assistance  Functional Status Assessment Patient has had a recent decline in their functional status and demonstrates the ability to make significant improvements in function in a reasonable and predictable amount of time.  Frequency and Duration min  2x/week  2 weeks      SLP Evaluation Cognition  Overall Cognitive Status: Impaired/Different from baseline Arousal/Alertness: Awake/alert Orientation Level: Oriented X4 Year: 2023 Month:  January Day of Week: Correct Attention: Focused;Sustained Focused Attention: Appears intact Sustained Attention: Impaired Sustained Attention Impairment: Verbal complex Memory: Impaired Memory Impairment: Retrieval deficit;Decreased short term memory Decreased Short Term Memory: Verbal complex Awareness: Impaired Awareness Impairment: Emergent impairment Problem Solving: Appears intact Executive Function: Reasoning;Sequencing;Organizing Reasoning: Appears intact Sequencing: Impaired Sequencing Impairment: Verbal complex (clock drawing: 0/4) Organizing: Impaired Organizing Impairment: Verbal complex (backward digit span: 0/2)       Comprehension  Auditory Comprehension Overall Auditory Comprehension: Impaired Yes/No Questions: Within Functional Limits Commands: Within Functional Limits Conversation: Complex (additional processing time needed) Interfering Components: Attention;Working Marine scientist;Hearing    Expression Expression Primary Mode of Expression: Verbal Verbal Expression Overall Verbal Expression: Impaired Initiation: No impairment Level of Generative/Spontaneous Verbalization: Conversation Repetition: No impairment Naming: No impairment Interfering Components: Attention   Oral / Motor  Oral Motor/Sensory Function Overall Oral Motor/Sensory Function: Within functional limits Motor Speech Overall Motor Speech: Appears within functional limits for tasks assessed Respiration: Within functional limits Phonation: Normal Resonance: Within functional limits Articulation: Within functional limitis Intelligibility: Intelligible Motor Planning: Witnin functional limits Motor Speech Errors: Not applicable           Caleb Simmons, [place], [place] Office number [phone] Pager [place] 01/17/2022, 9:10 AM

## 2022-01-17 NOTE — Progress Notes (Signed)
PROGRESS NOTE    Caleb Simmons  XBJ:[id number] DOB: [birth year] DOA: 01/14/2022 PCP: Merryl Hacker, No    Brief Narrative:  Caleb Simmons is a 71 year old male with past medical history significant for amyloidosis, plasma cell myeloma, ESRD on HD MWF, tobacco use disorder, chronic combined systolic/diastolic congestive heart failure who presents to [facility] ED on 1/23 with facial droop and slurred speech.  Work-up revealed acute left MCA territory infarct, likely watershed from hypoperfusion in the setting of left ICA stenosis.  Patient was transferred to [facility] on 01/15/2022 for neurology evaluation.   Assessment & Plan:   Principal Problem:   Acute ischemic left MCA stroke (HCC) Active Problems:   AL amyloidosis (HCC)   Malnutrition of moderate degree   Port-A-Cath in place   ESRD (end stage renal disease) (HCC)   BPH (benign prostatic hyperplasia)   Tobacco abuse   Anemia due to chronic kidney disease   PAD (peripheral artery disease) (HCC)   Acute ischemic left MCA CVA, POA Left ICA occlusion/high-grade stenosis Patient presenting to ED with dysarthria, facial droop and right-sided weakness.  MR brain with left MCA territory multiple infarcts.  CT angiogram head with complete occlusion of the left ICA as well as right ICA narrowing, occlusion of the left A1, moderate stenosis of the right vertebral artery.  Possible watershed infarct due to hypoperfusion in the setting of bilateral PAD and complete total occlusion of the left ICA. --Neurology/neuro interventional radiology following, appreciate assistance --LDL 85 --Hemoglobin A1c: 5.6 --TTE: LVEF 55-60%, LV normal function, no R WMA, mild LVH, G1DD, no atrial shunt --PT/OT with no recommendations --SLP evaluation: OP SLP --Aspirin 325 mg p.o. daily, Plavix 75 mg p.o. daily --Atorvastatin 80 mg p.o. daily --Continue monitor on telemetry, no arrhythmias other than PVCs noted/far --Dr. Estanislado Pandy plans cerebral angioplasty with  possible stent placement on 1/27  ESRD on HD MWF --Nephrology following, appreciate assistance --HD yesterday  AL amyloidosis Plasma cell myeloma Follows with medical oncology outpatient, Dr. Burr Medico.  Currently on chemotherapy with weekly oral Cytoxan, dexa and Velcade injection starting 09/10/21 and daratumumab injection 09/27/21. Plan 6 month treatment. -- Outpatient follow-up with oncology  Dyslipidemia/hypertriglyceridemia Lipid panel with HDL 28, LDL 85, total cholesterol 239, triglycerides 535. --Given ERSD, unable to order Tricor/gemfibrozil; may benefit from lipid clinic evaluation, consideration of Vascepa --Start atorvastatin 40 mg p.o. daily  Chronic orthostatic hypotension: Midodrine 5 mg p.o. 3 times daily  BPH: Tamsulosin 0.4 mg p.o. daily  Anemia secondary to chronic renal disease Hemoglobin 8.5, stable.  Anemia panel with iron 145, TIBC 308, ferritin 163, folate 77.1, B12 322.  Tobacco use disorder Counseled on need for complete cessation.  GERD: Continue PPI  Anxiety: Continue Xanax  Malnutrition of moderate degree Body mass index is 23.41 kg/m. --Dietitian consult --Remeron 30 mg p.o. nightly   DVT prophylaxis: SCD's Start: 01/14/22 2013   Code Status: Full Code Family Communication: Updated friend present at bedside this morning.  Disposition Plan:  Level of care: Telemetry Medical Status is: Inpatient  Remains inpatient appropriate because: Pending cerebral angioplasty with possible stent placement plan for 1/27    Consultants:  Neurology Neuro IR Nephrology  Procedures:  TTE: Pending Cerebral angiogram: Pending  Antimicrobials:  None   Subjective: Patient seen examined at bedside, resting comfortably.  No complaints this morning.  Awaiting cerebral angioplasty with possible stent placement by neuro interventional radiology planned for tomorrow.  No other specific complaints or concerns at this time.  Denies headache, no chest pain,  no  palpitations, no shortness of breath, no abdominal pain, no fever/chills/night sweats, no nausea/vomiting/diarrhea.  No acute concerns overnight per staff.  Objective: Vitals:   01/16/22 2325 01/17/22 0326 01/17/22 0821 01/17/22 1113  BP: 109/66 124/84 118/72 121/69  Pulse: 75 86 84 87  Resp: 18 18 16 16   Temp: 98.3 F (36.8 C) 98.6 F (37 C) 98.3 F (36.8 C) 98.2 F (36.8 C)  TempSrc: Oral Oral Oral Oral  SpO2: 96% 95% 95% 98%  Weight:        Intake/Output Summary (Last 24 hours) at 01/17/2022 1125 Last data filed at 01/16/2022 1144 Gross per 24 hour  Intake --  Output 1000 ml  Net -1000 ml   Filed Weights   01/16/22 0820  Weight: 74 kg    Examination:  General exam: Appears calm and comfortable, chronically ill in appearance Respiratory system: Clear to auscultation. Respiratory effort normal.  On room air Cardiovascular system: S1 & S2 heard, RRR. No JVD, murmurs, rubs, gallops or clicks. No pedal edema. Gastrointestinal system: Abdomen is nondistended, soft and nontender. No organomegaly or masses felt. Normal bowel sounds heard. Central nervous system: Alert and oriented. No focal neurological deficits. Extremities: Symmetric 5 x 5 power. Skin: No rashes, lesions or ulcers Psychiatry: Judgement and insight appear normal. Mood & affect appropriate.     Data Reviewed: I have personally reviewed following labs and imaging studies  CBC: Recent Labs  Lab 01/14/22 1342 01/14/22 1349 01/16/22 0355  WBC 6.6  --  6.7  NEUTROABS 4.6  --   --   HGB 9.4* 9.2* 8.5*  HCT 28.7* 27.0* 25.7*  MCV 91.4  --  91.5  PLT 205  --  086   Basic Metabolic Panel: Recent Labs  Lab 01/14/22 1342 01/14/22 1349 01/15/22 1514 01/16/22 0355  NA 136 135  --  133*  K 4.5 4.5  --  5.2*  CL 105 104  --  104  CO2 23  --   --  19*  GLUCOSE 114* 110*  --  88  BUN 32* 30*  --  38*  CREATININE 3.68* 3.90*  --  4.73*  CALCIUM 8.3*  --   --  8.5*  MG  --   --  1.9  --   PHOS  --   --   5.7*  --    GFR: Estimated Creatinine Clearance: 14.8 mL/min (A) (by C-G formula based on SCr of 4.73 mg/dL (H)). Liver Function Tests: Recent Labs  Lab 01/14/22 1342 01/15/22 1514  AST 14* 11*  ALT 12 11  ALKPHOS 86 72  BILITOT 0.6 0.3  PROT 5.9* 5.0*  ALBUMIN 3.2* 2.7*   No results for input(s): LIPASE, AMYLASE in the last 168 hours. No results for input(s): AMMONIA in the last 168 hours. Coagulation Profile: Recent Labs  Lab 01/14/22 1342 01/16/22 0355  INR 1.0 1.0   Cardiac Enzymes: No results for input(s): CKTOTAL, CKMB, CKMBINDEX, TROPONINI in the last 168 hours. BNP (last 3 results) No results for input(s): PROBNP in the last 8760 hours. HbA1C: Recent Labs    01/15/22 0333 01/16/22 0355  HGBA1C 5.6 5.6   CBG: No results for input(s): GLUCAP in the last 168 hours. Lipid Profile: Recent Labs    01/15/22 1514  CHOL 239*  HDL 28*  LDLCALC UNABLE TO CALCULATE IF TRIGLYCERIDE OVER 400 mg/dL  TRIG 535*  CHOLHDL 8.5  LDLDIRECT 85.5   Thyroid Function Tests: No results for input(s): TSH, T4TOTAL, FREET4, T3FREE, THYROIDAB  in the last 72 hours. Anemia Panel: Recent Labs    01/14/22 1342 01/14/22 1930  VITAMINB12  --  322  FOLATE  --  77.1  FERRITIN  --  163  TIBC  --  308  IRON  --  145  RETICCTPCT 0.9  --    Sepsis Labs: No results for input(s): PROCALCITON, LATICACIDVEN in the last 168 hours.  Recent Results (from the past 240 hour(s))  Resp Panel by RT-PCR (Flu A&B, Covid) Nasopharyngeal Swab     Status: None   Collection Time: 01/14/22  1:43 PM   Specimen: Nasopharyngeal Swab; Nasopharyngeal(NP) swabs in vial transport medium  Result Value Ref Range Status   SARS Coronavirus 2 by RT PCR NEGATIVE NEGATIVE Final    Comment: (NOTE) SARS-CoV-2 target nucleic acids are NOT DETECTED.  The SARS-CoV-2 RNA is generally detectable in upper respiratory specimens during the acute phase of infection. The lowest concentration of SARS-CoV-2 viral copies  this assay can detect is 138 copies/mL. A negative result does not preclude SARS-Cov-2 infection and should not be used as the sole basis for treatment or other patient management decisions. A negative result may occur with  improper specimen collection/handling, submission of specimen other than nasopharyngeal swab, presence of viral mutation(s) within the areas targeted by this assay, and inadequate number of viral copies(<138 copies/mL). A negative result must be combined with clinical observations, patient history, and epidemiological information. The expected result is Negative.  Fact Sheet for Patients:  EntrepreneurPulse.com.au  Fact Sheet for Healthcare Providers:  IncredibleEmployment.be  This test is no t yet approved or cleared by the [place] FDA and  has been authorized for detection and/or diagnosis of SARS-CoV-2 by FDA under an Emergency Use Authorization (EUA). This EUA will remain  in effect (meaning this test can be used) for the duration of the COVID-19 declaration under Section 564(b)(1) of the Act, 21 U.S.C.section 360bbb-3(b)(1), unless the authorization is terminated  or revoked sooner.       Influenza A by PCR NEGATIVE NEGATIVE Final   Influenza B by PCR NEGATIVE NEGATIVE Final    Comment: (NOTE) The Xpert Xpress SARS-CoV-2/FLU/RSV plus assay is intended as an aid in the diagnosis of influenza from Nasopharyngeal swab specimens and should not be used as a sole basis for treatment. Nasal washings and aspirates are unacceptable for Xpert Xpress SARS-CoV-2/FLU/RSV testing.  Fact Sheet for Patients: EntrepreneurPulse.com.au  Fact Sheet for Healthcare Providers: IncredibleEmployment.be  This test is not yet approved or cleared by the [place] FDA and has been authorized for detection and/or diagnosis of SARS-CoV-2 by FDA under an Emergency Use Authorization (EUA). This EUA  will remain in effect (meaning this test can be used) for the duration of the COVID-19 declaration under Section 564(b)(1) of the Act, 21 U.S.C. section 360bbb-3(b)(1), unless the authorization is terminated or revoked.  Performed at [facility] [place] [address], [place], [place] [address]          Radiology Studies: IR US Guide Vasc Access Right  Result Date: 01/17/2022 CLINICAL DATA:  History of transient ischemic attack comprising of right hand weakness with slurred speech evaluation of the brain revealed numerous diffusion-weighted abnormalities in the left middle cerebral artery distribution. CT of the head and neck revealed left internal carotid artery proximal occlusion and questionable string sign. EXAM: BILATERAL COMMON CAROTID AND INNOMINATE ANGIOGRAPHY COMPARISON:  CT angiogram of the head and neck of January 14, 2022. MEDICATIONS: Heparin 2000 units IV. No antibiotic was administered within  1 hour of the procedure. ANESTHESIA/SEDATION: Versed 1 mg IV; Fentanyl 25 mcg IV Moderate Sedation Time:  35 minutes The patient was continuously monitored during the procedure by the interventional radiology nurse under my direct supervision. CONTRAST:  Omnipaque 300 approximately 65 mL. FLUOROSCOPY TIME:  Fluoroscopy Time: 13 minutes 6 seconds (1170 mGy). COMPLICATIONS: None immediate. TECHNIQUE: Informed written consent was obtained from the patient after a thorough discussion of the procedural risks, benefits and alternatives. All questions were addressed. Maximal Sterile Barrier Technique was utilized including caps, mask, sterile gowns, sterile gloves, sterile drape, hand hygiene and skin antiseptic. A timeout was performed prior to the initiation of the procedure. The right forearm to the wrist was prepped and draped in usual sterile manner. The right radial artery was then identified with ultrasound, and its morphology documented permanently in the radiology PACS system. A  dorsal palmar anastomosis was verified to be present. Using ultrasound guidance and micropuncture set access into the right radial artery was obtained with a 4/5 French radial sheath over a 0.018 inch micro guidewire. The guidewire and obturator were removed. Good aspiration was obtained from the side port of the radial sheath. A cocktail of 2000 units of heparin, 2.5 mg of verapamil, and 200 mcg of nitroglycerin was then infused in diluted form through sheath without event. A right radial arteriogram was performed. Over a 0.035 inch Roadrunner guidewire, a 5 [place] Simmons 2 diagnostic catheter was then advanced to the aortic arch region, selectively positioned in the origin of the right vertebral artery, the right common carotid artery, the left common carotid artery and the left vertebral artery. A wrist band was then applied for hemostasis at the right radial puncture site. Distal right radial pulse was verified to be present. FINDINGS: The right vertebral artery origin demonstrates a severe stenosis. More distally, the vessel is seen to opacify to the cranial skull base. There is approximately 50% stenosis of the right vertebrobasilar junction just proximal to the origin of the right posterior-inferior cerebellar artery. Distal to this contrast is seen in the basilar artery and the posterior cerebral arteries. Mixed with unopacified blood is seen in the basilar artery from the contralateral vertebral artery. The right common carotid arteriogram demonstrates the right external carotid artery and its major branches to be widely patent. The right internal carotid artery has an eccentric smooth atherosclerotic plaque along the lateral aspect just distal to the bulb with approximately 20% stenosis by the NASCET criteria. More distally, the vessel is seen to opacify to the cranial skull base. Mild caliber irregularity probably related to arteriosclerosis is seen of the cavernous segment of the right internal carotid  artery. The right middle cerebral artery and the right anterior cerebral artery opacify into the capillary and venous phases. Prompt opacification of the left anterior cerebral A2 segment and distally is seen via anterior communicating artery. A small infundibulum is evident in the right posterior communicating artery. The left common carotid arteriogram demonstrates the left external carotid artery and its major branches to be patent. Left internal carotid artery at the bulb and its proximal 1/3 demonstrates slow ascent of contrast to near complete occlusion. The delayed images demonstrated ascent of contrast to the distal cervical left ICA. More distally, reconstitution of the left internal carotid artery and the supraclinoid segment is noted from left external carotid artery branches via the ipsilateral ophthalmic artery. Opacification of the right left middle cerebral artery distribution is evident. No opacification of the left anterior cerebral artery is evident. The  left vertebral artery origin is widely patent. The vessel is seen to opacify to the cranial skull base. Patency is seen of the left vertebrobasilar junction with a 50%-60% stenosis of the left vertebrobasilar junction just proximal to the origin of the left posterior-inferior cerebellar artery. Mild caliber irregularity of the proximal PICA suggests intracranial arteriosclerosis. More distally, opacification is seen of the basilar artery, the posterior cerebral arteries, the superior cerebellar arteries and the anterior-inferior cerebellar arteries into the capillary and venous phases. Unopacified blood is seen in the basilar artery from the contralateral vertebral artery. 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 Partial reconstitution of the left internal carotid artery supraclinoid segment from the ipsilateral  ophthalmic artery. Non opacification of the left anterior cerebral artery A1 segment. 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 Approximately 50% to 60% stenosis of the dominant left vertebrobasilar junction just proximal to the left posterior-inferior cerebellar artery. Approximately 50% stenosis of the right vertebrobasilar junction proximal to the right posteroinferior cerebellar artery. PLAN: Finding discussed with the patient and the referring neurologist. Electronically Signed   By: Luanne Bras M.D.   On: 01/17/2022 08:16   ECHOCARDIOGRAM COMPLETE  Result Date: 01/16/2022    ECHOCARDIOGRAM REPORT   Patient Name:   Caleb Simmons Date of Exam: 01/16/2022 Medical Rec #:  [id number]    Height:       70.0 in Accession #:    [id number]   Weight:       163.1 lb Date of Birth:  Apr 19, 1951     BSA:          1.914 m Patient Age:    59 years     BP:           128/79 mmHg Patient Gender: M            HR:           83 bpm. Exam Location:  Inpatient Procedure: 2D Echo Indications:    Stroke  History:        Patient has prior history of Echocardiogram examinations, most                 recent 09/08/2021.  Sonographer:    [name] Referring Phys: Warren  1. Left ventricular ejection fraction, by estimation, is 55 to 60%. The left ventricle has normal function. The left ventricle has no regional wall motion abnormalities. There is mild left ventricular hypertrophy. Left ventricular diastolic parameters are consistent with Grade I diastolic dysfunction (impaired relaxation).  2. Right ventricular systolic function is normal. The right ventricular size is normal. Tricuspid regurgitation signal is inadequate for assessing PA pressure.  3. The mitral valve is normal in structure. Trivial mitral valve regurgitation. No evidence of mitral stenosis.  4. The aortic valve is tricuspid. Aortic valve regurgitation is not visualized. Aortic valve sclerosis/calcification is  present, without any evidence of aortic stenosis.  5. Aortic dilatation noted. There is mild dilatation of the ascending aorta, measuring 38 mm.  6. The inferior vena cava is normal in size with greater than 50% respiratory variability, suggesting right atrial pressure of 3 mmHg.  7. A small pericardial effusion is present. FINDINGS  Left Ventricle: Left ventricular ejection fraction, by estimation, is 55 to 60%. The left ventricle has normal function. The left ventricle has no regional wall motion abnormalities. The left  ventricular internal cavity size was normal in size. There is  mild left ventricular hypertrophy. Left ventricular diastolic parameters are consistent with Grade I diastolic dysfunction (impaired relaxation). Right Ventricle: The right ventricular size is normal. No increase in right ventricular wall thickness. Right ventricular systolic function is normal. Tricuspid regurgitation signal is inadequate for assessing PA pressure. Left Atrium: Left atrial size was normal in size. Right Atrium: Right atrial size was normal in size. Pericardium: A small pericardial effusion is present. Mitral Valve: The mitral valve is normal in structure. There is mild calcification of the mitral valve leaflet(s). Mild mitral annular calcification. Trivial mitral valve regurgitation. No evidence of mitral valve stenosis. Tricuspid Valve: The tricuspid valve is normal in structure. Tricuspid valve regurgitation is not demonstrated. Aortic Valve: The aortic valve is tricuspid. Aortic valve regurgitation is not visualized. Aortic valve sclerosis/calcification is present, without any evidence of aortic stenosis. Aortic valve mean gradient measures 5.0 mmHg. Aortic valve peak gradient measures 9.0 mmHg. Aortic valve area, by VTI measures 1.94 cm. Pulmonic Valve: The pulmonic valve was normal in structure. Pulmonic valve regurgitation is not visualized. Aorta: The aortic root is normal in size and structure and aortic  dilatation noted. There is mild dilatation of the ascending aorta, measuring 38 mm. Venous: The inferior vena cava is normal in size with greater than 50% respiratory variability, suggesting right atrial pressure of 3 mmHg. IAS/Shunts: No atrial level shunt detected by color flow Doppler.  LEFT VENTRICLE PLAX 2D LVIDd:         5.00 cm   Diastology LVIDs:         3.80 cm   LV e' medial:    5.77 cm/s LV PW:         1.20 cm   LV E/e' medial:  8.4 LV IVS:        1.20 cm   LV e' lateral:   12.10 cm/s LVOT diam:     2.00 cm   LV E/e' lateral: 4.0 LV SV:         42 LV SV Index:   22 LVOT Area:     3.14 cm  RIGHT VENTRICLE RV Basal diam:  3.30 cm RV Mid diam:    2.50 cm RV S prime:     13.60 cm/s TAPSE (M-mode): 1.7 cm LEFT ATRIUM             Index        RIGHT ATRIUM           Index LA diam:        3.30 cm 1.72 cm/m   RA Area:     13.50 cm LA Vol (A2C):   42.2 ml 22.05 ml/m  RA Volume:   31.50 ml  16.46 ml/m LA Vol (A4C):   49.4 ml 25.81 ml/m LA Biplane Vol: 46.0 ml 24.03 ml/m  AORTIC VALVE AV Area (Vmax):    2.08 cm AV Area (Vmean):   1.97 cm AV Area (VTI):     1.94 cm AV Vmax:           150.00 cm/s AV Vmean:          99.300 cm/s AV VTI:            0.219 m AV Peak Grad:      9.0 mmHg AV Mean Grad:      5.0 mmHg LVOT Vmax:         99.25 cm/s LVOT Vmean:  62.150 cm/s LVOT VTI:          0.135 m LVOT/AV VTI ratio: 0.62  AORTA Ao Root diam: 3.40 cm Ao Asc diam:  3.80 cm MITRAL VALVE MV Area (PHT): 4.57 cm    SHUNTS MV Decel Time: 166 msec    Systemic VTI:  0.14 m MV E velocity: 48.40 cm/s  Systemic Diam: 2.00 cm MV A velocity: 67.70 cm/s MV E/A ratio:  0.71 Dalton McleanMD Electronically signed by Franki Monte Signature Date/Time: 01/16/2022/5:45:45 PM    Final    DG HIP UNILAT WITH PELVIS 2-3 VIEWS LEFT  Result Date: 01/16/2022 CLINICAL DATA:  Left hip pain.  No injury. EXAM: DG HIP (WITH OR WITHOUT PELVIS) 2-3V LEFT COMPARISON:  Hip x-ray 01/14/2022. FINDINGS: There is no evidence of hip fracture or  dislocation. There is no evidence of arthropathy or other focal bone abnormality. IMPRESSION: Negative. Electronically Signed   By: Ronney Asters M.D.   On: 01/16/2022 19:24   IR ANGIO INTRA EXTRACRAN SEL COM CAROTID INNOMINATE BILAT MOD SED  Result Date: 01/17/2022 CLINICAL DATA:  History of transient ischemic attack comprising of right hand weakness with slurred speech evaluation of the brain revealed numerous diffusion-weighted abnormalities in the left middle cerebral artery distribution. CT of the head and neck revealed left internal carotid artery proximal occlusion and questionable string sign. EXAM: BILATERAL COMMON CAROTID AND INNOMINATE ANGIOGRAPHY COMPARISON:  CT angiogram of the head and neck of January 14, 2022. MEDICATIONS: Heparin 2000 units IV. No antibiotic was administered within 1 hour of the procedure. ANESTHESIA/SEDATION: Versed 1 mg IV; Fentanyl 25 mcg IV Moderate Sedation Time:  35 minutes The patient was continuously monitored during the procedure by the interventional radiology nurse under my direct supervision. CONTRAST:  Omnipaque 300 approximately 65 mL. FLUOROSCOPY TIME:  Fluoroscopy Time: 13 minutes 6 seconds (1170 mGy). COMPLICATIONS: None immediate. TECHNIQUE: Informed written consent was obtained from the patient after a thorough discussion of the procedural risks, benefits and alternatives. All questions were addressed. Maximal Sterile Barrier Technique was utilized including caps, mask, sterile gowns, sterile gloves, sterile drape, hand hygiene and skin antiseptic. A timeout was performed prior to the initiation of the procedure. The right forearm to the wrist was prepped and draped in usual sterile manner. The right radial artery was then identified with ultrasound, and its morphology documented permanently in the radiology PACS system. A dorsal palmar anastomosis was verified to be present. Using ultrasound guidance and micropuncture set access into the right radial artery  was obtained with a 4/5 French radial sheath over a 0.018 inch micro guidewire. The guidewire and obturator were removed. Good aspiration was obtained from the side port of the radial sheath. A cocktail of 2000 units of heparin, 2.5 mg of verapamil, and 200 mcg of nitroglycerin was then infused in diluted form through sheath without event. A right radial arteriogram was performed. Over a 0.035 inch Roadrunner guidewire, a 5 [place] Simmons 2 diagnostic catheter was then advanced to the aortic arch region, selectively positioned in the origin of the right vertebral artery, the right common carotid artery, the left common carotid artery and the left vertebral artery. A wrist band was then applied for hemostasis at the right radial puncture site. Distal right radial pulse was verified to be present. FINDINGS: The right vertebral artery origin demonstrates a severe stenosis. More distally, the vessel is seen to opacify to the cranial skull base. There is approximately 50% stenosis of the right vertebrobasilar junction just proximal to the  origin of the right posterior-inferior cerebellar artery. Distal to this contrast is seen in the basilar artery and the posterior cerebral arteries. Mixed with unopacified blood is seen in the basilar artery from the contralateral vertebral artery. The right common carotid arteriogram demonstrates the right external carotid artery and its major branches to be widely patent. The right internal carotid artery has an eccentric smooth atherosclerotic plaque along the lateral aspect just distal to the bulb with approximately 20% stenosis by the NASCET criteria. More distally, the vessel is seen to opacify to the cranial skull base. Mild caliber irregularity probably related to arteriosclerosis is seen of the cavernous segment of the right internal carotid artery. The right middle cerebral artery and the right anterior cerebral artery opacify into the capillary and venous phases. Prompt  opacification of the left anterior cerebral A2 segment and distally is seen via anterior communicating artery. A small infundibulum is evident in the right posterior communicating artery. The left common carotid arteriogram demonstrates the left external carotid artery and its major branches to be patent. Left internal carotid artery at the bulb and its proximal 1/3 demonstrates slow ascent of contrast to near complete occlusion. The delayed images demonstrated ascent of contrast to the distal cervical left ICA. More distally, reconstitution of the left internal carotid artery and the supraclinoid segment is noted from left external carotid artery branches via the ipsilateral ophthalmic artery. Opacification of the right left middle cerebral artery distribution is evident. No opacification of the left anterior cerebral artery is evident. The left vertebral artery origin is widely patent. The vessel is seen to opacify to the cranial skull base. Patency is seen of the left vertebrobasilar junction with a 50%-60% stenosis of the left vertebrobasilar junction just proximal to the origin of the left posterior-inferior cerebellar artery. Mild caliber irregularity of the proximal PICA suggests intracranial arteriosclerosis. More distally, opacification is seen of the basilar artery, the posterior cerebral arteries, the superior cerebellar arteries and the anterior-inferior cerebellar arteries into the capillary and venous phases. Unopacified blood is seen in the basilar artery from the contralateral vertebral artery. 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 Partial reconstitution of the left internal carotid artery supraclinoid segment from the ipsilateral ophthalmic artery. Non opacification of the left anterior cerebral artery A1 segment. Severe stenosis at the origin of the right vertebral  artery. Approximately 50% to 60% stenosis of the dominant left vertebrobasilar junction just proximal to the left posterior-inferior cerebellar artery. Approximately 50% stenosis of the right vertebrobasilar junction proximal to the right posteroinferior cerebellar artery. PLAN: Finding discussed with the patient and the referring neurologist. Electronically Signed   By: Luanne Bras M.D.   On: 01/17/2022 08:16   IR ANGIO VERTEBRAL SEL SUBCLAVIAN INNOMINATE BILAT MOD SED  Result Date: 01/17/2022 CLINICAL DATA:  History of transient ischemic attack comprising of right hand weakness with slurred speech evaluation of the brain revealed numerous diffusion-weighted abnormalities in the left middle cerebral artery distribution. CT of the head and neck revealed left internal carotid artery proximal occlusion and questionable string sign. EXAM: BILATERAL COMMON CAROTID AND INNOMINATE ANGIOGRAPHY COMPARISON:  CT angiogram of the head and neck of January 14, 2022. MEDICATIONS: Heparin 2000 units IV. No antibiotic was administered within 1 hour of the procedure. ANESTHESIA/SEDATION: Versed 1 mg IV; Fentanyl 25 mcg IV Moderate Sedation Time:  35 minutes The patient was continuously monitored during the  procedure by the interventional radiology nurse under my direct supervision. CONTRAST:  Omnipaque 300 approximately 65 mL. FLUOROSCOPY TIME:  Fluoroscopy Time: 13 minutes 6 seconds (1170 mGy). COMPLICATIONS: None immediate. TECHNIQUE: Informed written consent was obtained from the patient after a thorough discussion of the procedural risks, benefits and alternatives. All questions were addressed. Maximal Sterile Barrier Technique was utilized including caps, mask, sterile gowns, sterile gloves, sterile drape, hand hygiene and skin antiseptic. A timeout was performed prior to the initiation of the procedure. The right forearm to the wrist was prepped and draped in usual sterile manner. The right radial artery was then  identified with ultrasound, and its morphology documented permanently in the radiology PACS system. A dorsal palmar anastomosis was verified to be present. Using ultrasound guidance and micropuncture set access into the right radial artery was obtained with a 4/5 French radial sheath over a 0.018 inch micro guidewire. The guidewire and obturator were removed. Good aspiration was obtained from the side port of the radial sheath. A cocktail of 2000 units of heparin, 2.5 mg of verapamil, and 200 mcg of nitroglycerin was then infused in diluted form through sheath without event. A right radial arteriogram was performed. Over a 0.035 inch Roadrunner guidewire, a 5 [place] Simmons 2 diagnostic catheter was then advanced to the aortic arch region, selectively positioned in the origin of the right vertebral artery, the right common carotid artery, the left common carotid artery and the left vertebral artery. A wrist band was then applied for hemostasis at the right radial puncture site. Distal right radial pulse was verified to be present. FINDINGS: The right vertebral artery origin demonstrates a severe stenosis. More distally, the vessel is seen to opacify to the cranial skull base. There is approximately 50% stenosis of the right vertebrobasilar junction just proximal to the origin of the right posterior-inferior cerebellar artery. Distal to this contrast is seen in the basilar artery and the posterior cerebral arteries. Mixed with unopacified blood is seen in the basilar artery from the contralateral vertebral artery. The right common carotid arteriogram demonstrates the right external carotid artery and its major branches to be widely patent. The right internal carotid artery has an eccentric smooth atherosclerotic plaque along the lateral aspect just distal to the bulb with approximately 20% stenosis by the NASCET criteria. More distally, the vessel is seen to opacify to the cranial skull base. Mild caliber  irregularity probably related to arteriosclerosis is seen of the cavernous segment of the right internal carotid artery. The right middle cerebral artery and the right anterior cerebral artery opacify into the capillary and venous phases. Prompt opacification of the left anterior cerebral A2 segment and distally is seen via anterior communicating artery. A small infundibulum is evident in the right posterior communicating artery. The left common carotid arteriogram demonstrates the left external carotid artery and its major branches to be patent. Left internal carotid artery at the bulb and its proximal 1/3 demonstrates slow ascent of contrast to near complete occlusion. The delayed images demonstrated ascent of contrast to the distal cervical left ICA. More distally, reconstitution of the left internal carotid artery and the supraclinoid segment is noted from left external carotid artery branches via the ipsilateral ophthalmic artery. Opacification of the right left middle cerebral artery distribution is evident. No opacification of the left anterior cerebral artery is evident. The left vertebral artery origin is widely patent. The vessel is seen to opacify to the cranial skull base. Patency is seen of the left vertebrobasilar junction with  a 50%-60% stenosis of the left vertebrobasilar junction just proximal to the origin of the left posterior-inferior cerebellar artery. Mild caliber irregularity of the proximal PICA suggests intracranial arteriosclerosis. More distally, opacification is seen of the basilar artery, the posterior cerebral arteries, the superior cerebellar arteries and the anterior-inferior cerebellar arteries into the capillary and venous phases. Unopacified blood is seen in the basilar artery from the contralateral vertebral artery. 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 Partial reconstitution of the left internal carotid artery supraclinoid segment from the ipsilateral ophthalmic artery. Non opacification of the left anterior cerebral artery A1 segment. 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 Approximately 50% to 60% stenosis of the dominant left vertebrobasilar junction just proximal to the left posterior-inferior cerebellar artery. Approximately 50% stenosis of the right vertebrobasilar junction proximal to the right posteroinferior cerebellar artery. PLAN: Finding discussed with the patient and the referring neurologist. Electronically Signed   By: Luanne Bras M.D.   On: 01/17/2022 08:16        Scheduled Meds:   stroke: mapping our early stages of recovery book   Does not apply Once   aspirin EC  325 mg Oral Daily   atorvastatin  40 mg Oral QHS   calcitRIOL  0.25 mcg Oral Q M,W,F-HD   Chlorhexidine Gluconate Cloth  6 each Topical Q0600   clopidogrel  75 mg Oral Daily   folic acid  2 mg Oral q AM   midodrine  5 mg Oral TID WC   mirtazapine  30 mg Oral QHS   pantoprazole  40 mg Oral BID AC   sevelamer carbonate  800 mg Oral TID WC   tamsulosin  0.4 mg Oral q AM   Continuous Infusions:   LOS: 3 days    Time spent: 46 minutes spent on chart review, discussion with nursing staff, consultants, updating family and interview/physical exam; more than 50% of that time was spent in counseling and/or coordination of care.    Tranae Laramie J [place] DO [facility] Available via Epic secure chat 7am-7pm After these hours, please refer to coverage provider listed on [email or web address] 01/17/2022, 11:25 AM

## 2022-01-17 NOTE — Progress Notes (Addendum)
NIR follow up. Able to secure anesthesia time for tomorrow. Pt scheduled for cerebral angiogram with intended stent angioplasty of (L)ICA Discussed with pt at length. Cont ASA, switching to Brilinta pre-procedure. Starting tonight 180mg  x 1, then 90 mg BID Made NPO p MN.   Ascencion Dike PA-C Interventional Radiology 01/17/2022 3:19 PM

## 2022-01-17 NOTE — TOC Transition Note (Signed)
Transition of Care [facility]) - CM/SW Discharge Note   Patient Details  Name: Caleb Simmons MRN: [id number] Date of Birth: 01-13-1951  [facility] CM/SW Contact:  Pollie Friar, RN Phone Number: 01/17/2022, 11:12 AM   Clinical Narrative:    Patient discharging home with his significant other. She is able to provide the supervision needed at home. SO provides needed transportation. Pt denies issues with home medications.  CM inquired about a PCP and pt sees his oncologist and nephrologist and doesn't want a PCP at this time.  Recommendations for outpatient speech therapy. Pt and SO would like him to attend [employer]. Orders in Epic and information on the AVS.  SO will transport home when discharged.    Final next level of care: OP Rehab Barriers to Discharge: No Barriers Identified   Patient Goals and CMS Choice   CMS Medicare.gov Compare Post Acute Care list provided to:: Patient Choice offered to / list presented to : Patient, Spouse  Discharge Placement                       Discharge Plan and Services                                     Social Determinants of Health (SDOH) Interventions     Readmission Risk Interventions No flowsheet data found.

## 2022-01-17 NOTE — Anesthesia Preprocedure Evaluation (Addendum)
Anesthesia Evaluation  Patient identified by MRN, date of birth, ID band Patient awake    Reviewed: Allergy & Precautions, H&P , NPO status , Patient's Chart, lab work & pertinent test results  Airway Mallampati: II  TM Distance: >3 FB Neck ROM: Full    Dental no notable dental hx.    Pulmonary neg pulmonary ROS, Current Smoker,    Pulmonary exam normal breath sounds clear to auscultation       Cardiovascular Exercise Tolerance: Good + Peripheral Vascular Disease  negative cardio ROS Normal cardiovascular exam Rhythm:Regular Rate:Normal  ECHO 1/23 1. Left ventricular ejection fraction, by estimation, is 55 to 60%. The  left ventricle has normal function. The left ventricle has no regional  wall motion abnormalities. There is mild left ventricular hypertrophy.  Left ventricular diastolic parameters  are consistent with Grade I diastolic dysfunction (impaired relaxation).  2. Right ventricular systolic function is normal. The right ventricular  size is normal. Tricuspid regurgitation signal is inadequate for assessing  PA pressure.  3. The mitral valve is normal in structure. Trivial mitral valve  regurgitation. No evidence of mitral stenosis.  4. The aortic valve is tricuspid. Aortic valve regurgitation is not  visualized. Aortic valve sclerosis/calcification is present, without any  evidence of aortic stenosis.    Neuro/Psych CVA, Residual Symptoms negative neurological ROS  negative psych ROS   GI/Hepatic negative GI ROS, Neg liver ROS,   Endo/Other  negative endocrine ROS  Renal/GU DialysisRenal diseasenegative Renal ROS  negative genitourinary   Musculoskeletal negative musculoskeletal ROS (+)   Abdominal   Peds negative pediatric ROS (+)  Hematology negative hematology ROS (+) Blood dyscrasia, anemia ,   Anesthesia Other Findings   Reproductive/Obstetrics negative OB ROS                             Anesthesia Physical Anesthesia Plan  ASA: 4  Anesthesia Plan: General   Post-op Pain Management: Minimal or no pain anticipated   Induction: Intravenous  PONV Risk Score and Plan: 2 and Ondansetron, Treatment may vary due to age or medical condition and Dexamethasone  Airway Management Planned: Oral ETT  Additional Equipment: Arterial line  Intra-op Plan:   Post-operative Plan: Extubation in OR  Informed Consent: I have reviewed the patients History and Physical, chart, labs and discussed the procedure including the risks, benefits and alternatives for the proposed anesthesia with the patient or authorized representative who has indicated his/her understanding and acceptance.       Plan Discussed with: Anesthesiologist and CRNA  Anesthesia Plan Comments: ([name] is a 71 y.o. 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 No tPA given due to OOW.    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  Left A1 occlusion. MRI left MCA scattered infarcts Cerebral angiogram Occluded left ICA prox with a delayed string sign. Partial reconstitution of left ICA supraclinoid from left ECA branches via left Ophthalmic A.  Severe proximal right VA origin stenosis, 50% bilateral VB junction stenosis. 2D Echo EF 55 to 60% LDL 85.5, TG 535 HgbA1c 5.6  Carotid stenosis/occlusion CTA head and neck left ICA occlusion with reconstituted at terminal, moderate stenosis right ICA siphon,  Cerebral angiogram Occluded left ICA prox with a delayed string sign. Partial reconstitution of left ICA supraclinoid from left ECA branches via left  Ophthalmic A.   Plan for possible left ICA angioplasty/stenting tomorrow.  AL amyloidosis Follows with Dr. Burr Simmons at cancer center On chemotherapy Has renal, heart and neuro involvement ESRD on hemodialysis Orthostatic hypotension on midodrine Hx of CHF and mild cardiomyopathy EKG showed frequent PACs and tele showed frequent ectopy, no afib found)        Anesthesia Quick Evaluation

## 2022-01-17 NOTE — Progress Notes (Addendum)
STROKE TEAM PROGRESS NOTE   SUBJECTIVE (INTERVAL HISTORY) His wife is at the bedside.  Pt lying in bed, neuro intact. He had cerebral angiogram yesterday with Dr. 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 Partial reconstitution of left ICA supraclinoid from left ECA branches via left Ophthalmic A.  Plan for possible left ICA angioplasty and stenting tomorrow.   OBJECTIVE Temp:  [97.6 F (36.4 C)-98.6 F (37 C)] 97.9 F (36.6 C) (01/26 1600) Pulse Rate:  [75-88] 77 (01/26 1600) Cardiac Rhythm: Normal sinus rhythm (01/26 0841) Resp:  [16-18] 16 (01/26 1600) BP: (91-124)/(66-89) 119/71 (01/26 1600) SpO2:  [95 %-100 %] 99 % (01/26 1600)  No results for input(s): GLUCAP in the last 168 hours. Recent Labs  Lab 01/14/22 1342 01/14/22 1349 01/15/22 1514 01/16/22 0355  NA 136 135  --  133*  K 4.5 4.5  --  5.2*  CL 105 104  --  104  CO2 23  --   --  19*  GLUCOSE 114* 110*  --  88  BUN 32* 30*  --  38*  CREATININE 3.68* 3.90*  --  4.73*  CALCIUM 8.3*  --   --  8.5*  MG  --   --  1.9  --   PHOS  --   --  5.7*  --    Recent Labs  Lab 01/14/22 1342 01/15/22 1514  AST 14* 11*  ALT 12 11  ALKPHOS 86 72  BILITOT 0.6 0.3  PROT 5.9* 5.0*  ALBUMIN 3.2* 2.7*   Recent Labs  Lab 01/14/22 1342 01/14/22 1349 01/16/22 0355  WBC 6.6  --  6.7  NEUTROABS 4.6  --   --   HGB 9.4* 9.2* 8.5*  HCT 28.7* 27.0* 25.7*  MCV 91.4  --  91.5  PLT 205  --  219   No results for input(s): CKTOTAL, CKMB, CKMBINDEX, TROPONINI in the last 168 hours. Recent Labs    01/16/22 0355  LABPROT 13.4  INR 1.0   Recent Labs    01/14/22 1823  COLORURINE STRAW*  LABSPEC 1.010  PHURINE 8.0  GLUCOSEU 50*  HGBUR NEGATIVE  BILIRUBINUR NEGATIVE  KETONESUR NEGATIVE  PROTEINUR >=300*  NITRITE NEGATIVE  LEUKOCYTESUR NEGATIVE       Component Value Date/Time   CHOL 239 (H) 01/15/2022 1514   TRIG 535 (H) 01/15/2022 1514   HDL 28 (L) 01/15/2022 1514   CHOLHDL 8.5 01/15/2022  1514   VLDL UNABLE TO CALCULATE IF TRIGLYCERIDE OVER 400 mg/dL 01/15/2022 1514   LDLCALC UNABLE TO CALCULATE IF TRIGLYCERIDE OVER 400 mg/dL 01/15/2022 1514   Lab Results  Component Value Date   HGBA1C 5.6 01/16/2022      Component Value Date/Time   LABOPIA NONE DETECTED 01/14/2022 1823   COCAINSCRNUR NONE DETECTED 01/14/2022 1823   LABBENZ POSITIVE (A) 01/14/2022 1823   AMPHETMU NONE DETECTED 01/14/2022 1823   THCU NONE DETECTED 01/14/2022 1823   LABBARB NONE DETECTED 01/14/2022 1823    Recent Labs  Lab 01/14/22 1342  ETH <10    I have personally reviewed the radiological images below and agree with the radiology interpretations.  CT Angio Head W/Cm &/Or Wo Cm  Result Date: 01/14/2022 CLINICAL DATA:  Right hand weakness, right-sided facial droop and slurred speech EXAM: CT ANGIOGRAPHY HEAD AND NECK TECHNIQUE: Multidetector CT imaging of the head and neck was performed using the standard protocol during bolus administration of intravenous contrast. Multiplanar CT image reconstructions and MIPs were obtained to  evaluate the vascular anatomy. Carotid stenosis measurements (when applicable) are obtained utilizing NASCET criteria, using the distal internal carotid diameter as the denominator. RADIATION DOSE REDUCTION: This exam was performed according to the departmental dose-optimization program which includes automated exposure control, adjustment of the mA and/or kV according to patient size and/or use of iterative reconstruction technique. CONTRAST:  143mL OMNIPAQUE IOHEXOL 350 MG/ML SOLN COMPARISON:  01/14/2022 CT head and MRI FINDINGS: CT HEAD FINDINGS Brain: No evidence of acute hemorrhage, cerebral edema, mass, mass effect, or midline shift. No hydrocephalus or extra-axial fluid collection. Periventricular white matter changes, likely the sequela of chronic small vessel ischemic disease. The acute infarcts seen on the same-day MRI are not significantly apparent on this CT. Vascular: No  hyperdense vessel. Skull: Normal. Negative for fracture or focal lesion. Sinuses/Orbits: No acute finding. Other: The mastoid air cells are well aerated. CTA NECK FINDINGS Aortic arch: Standard branching. Imaged portion shows no evidence of aneurysm or dissection. No significant stenosis of the major arch vessel origins. Right carotid system: No evidence of dissection, stenosis (50% or greater) or occlusion. Left carotid system: Evaluation of the origin of the left common carotid artery is somewhat limited due to beam hardening artifact from the adjacent venous bolus, however there appears to be mild narrowing at the origin. 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 No distal reconstitution of the extracranial ICA. Vertebral arteries: Moderate narrowing at the origin of the right vertebral artery, secondary to noncalcified plaque. Focal moderate stenosis of the right V2 (series 12, image 248) at the level of C6, as well as in the right C5-C6 foramen, secondary to degenerative changes (series 12, image 242). The right vertebral artery is otherwise patent. The left vertebral artery demonstrates mild calcified narrowing in the V2 segment, but is otherwise patent. Codominant. Skeleton: Multilevel degenerative changes in the cervical spine, with trace retrolisthesis of C5 on C6. No acute osseous abnormality. Other neck: Negative. Upper chest: Dependent atelectasis. Review of the MIP images confirms the above findings CTA HEAD FINDINGS Evaluation is somewhat limited by degree of venous contamination. Anterior circulation: The left intracranial internal carotid artery is non-opacified until the ophthalmic and supraclinoid portions (series 12, image 111). Calcifications in the right intracranial internal carotid artery, which cause moderate narrowing in the distal cavernous segment and proximal supraclinoid segment. Occlusion of the left A1 (series 12,  image 100), with a small area of filling in the distal left A1, likely retrograde. Patent right A1. Normal anterior communicating artery. Anterior cerebral arteries are patent to their distal aspects. No M1 stenosis or occlusion. Normal MCA bifurcations. Distal MCA branches perfused and symmetric. Posterior circulation: Vertebral arteries patent to the vertebrobasilar junction, with mild calcifications and irregularity in the right V4 segment, prior to the PICA takeoff (series 12, image 145), which causes mild-to-moderate narrowing. Mild to moderate narrowing in the left V4, proximal to the left PICA takeoff (series 12, image 144). Posterior inferior cerebral arteries patent bilaterally. Basilar patent to its distal aspect. Superior cerebellar arteries patent bilaterally. Bilateral P1 segments originate from the basilar artery. PCAs perfused to their distal aspects, with mild narrowing in the left P1/P2 and poor opacification of the distal left P2 and P3 segments. The bilateral posterior communicating arteries are not definitively seen. Venous sinuses: As permitted by contrast timing, patent. Anatomic variants: None significant Review of the MIP images confirms the above findings IMPRESSION: 1. 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 The left intracranial internal carotid artery reconstitutes in the ophthalmic and supraclinoid portions. 2. Moderate narrowing in the distal right cavernous ICA and proximal supraclinoid segment. 3. Occlusion of the left A1.  The left ACA is otherwise patent. 4. Moderate stenosis at the origin of the right vertebral artery, with additional focal moderate stenosis of the right V2 at the level of C6, both secondary to atherosclerotic plaque and degenerative changes. Mild-to-moderate stenosis of the bilateral V4 segments. 5. Poor opacification of the distal left P2 and P3 segments. These results were called by telephone at the time of  interpretation on 01/14/2022 at 8:48 pm to provider DR. DOUTOVA , who verbally acknowledged these results. Electronically Signed   By: Merilyn Baba M.D.   On: 01/14/2022 20:48   DG Chest 2 View  Result Date: 01/14/2022 CLINICAL DATA:  Facial droop EXAM: CHEST - 2 VIEW COMPARISON:  09/06/2021 FINDINGS: Right dialysis catheter remains in place, unchanged. Heart and mediastinal contours are within normal limits. No focal opacities or effusions. No acute bony abnormality. Old healed left rib fractures. IMPRESSION: No active cardiopulmonary disease. Electronically Signed   By: Rolm Baptise M.D.   On: 01/14/2022 19:34   CT HEAD WO CONTRAST  Result Date: 01/14/2022 CLINICAL DATA:  Facial droop and right arm weakness and numbness for the past 2 days. EXAM: CT HEAD WITHOUT CONTRAST TECHNIQUE: Contiguous axial images were obtained from the base of the skull through the vertex without intravenous contrast. RADIATION DOSE REDUCTION: This exam was performed according to the departmental dose-optimization program which includes automated exposure control, adjustment of the mA and/or kV according to patient size and/or use of iterative reconstruction technique. COMPARISON:  None. FINDINGS: Brain: Normal size and position of the ventricles. Minimal patchy white matter low density in both cerebral hemispheres. No intracranial hemorrhage, mass lesion or CT evidence of acute infarction. Vascular: No hyperdense vessel or unexpected calcification. Skull: Normal. Negative for fracture or focal lesion. Sinuses/Orbits: Unremarkable. Other: None. IMPRESSION: 1. No acute abnormality. 2. Minimal chronic small vessel white matter ischemic changes in both cerebral hemispheres. These make it difficult to exclude a small acute subcortical infarct. If this is a continued clinical concern, this could be further evaluated with an MRI of the brain. Electronically Signed   By: Claudie Revering M.D.   On: 01/14/2022 14:17   CT Angio Neck W and/or  Wo Contrast  Result Date: 01/14/2022 CLINICAL DATA:  Right hand weakness, right-sided facial droop and slurred speech EXAM: CT ANGIOGRAPHY HEAD AND NECK TECHNIQUE: Multidetector CT imaging of the head and neck was performed using the standard protocol during bolus administration of intravenous contrast. Multiplanar CT image reconstructions and MIPs were obtained to evaluate the vascular anatomy. Carotid stenosis measurements (when applicable) are obtained utilizing NASCET criteria, using the distal internal carotid diameter as the denominator. RADIATION DOSE REDUCTION: This exam was performed according to the departmental dose-optimization program which includes automated exposure control, adjustment of the mA and/or kV according to patient size and/or use of iterative reconstruction technique. CONTRAST:  17mL OMNIPAQUE IOHEXOL 350 MG/ML SOLN COMPARISON:  01/14/2022 CT head and MRI FINDINGS: CT HEAD FINDINGS Brain: No evidence of acute hemorrhage, cerebral edema, mass, mass effect, or midline shift. No hydrocephalus or extra-axial fluid collection. Periventricular white matter changes, likely the sequela of chronic small vessel ischemic disease. The acute infarcts seen on the same-day MRI are not significantly apparent on this CT. Vascular: No hyperdense vessel. Skull: Normal. Negative for fracture or focal  lesion. Sinuses/Orbits: No acute finding. Other: The mastoid air cells are well aerated. CTA NECK FINDINGS Aortic arch: Standard branching. Imaged portion shows no evidence of aneurysm or dissection. No significant stenosis of the major arch vessel origins. Right carotid system: No evidence of dissection, stenosis (50% or greater) or occlusion. Left carotid system: Evaluation of the origin of the left common carotid artery is somewhat limited due to beam hardening artifact from the adjacent venous bolus, however there appears to be mild narrowing at the origin. Complete occlusion of the left internal carotid  artery, just distal to the bifurcation (series 12, image 192-198). No distal reconstitution of the extracranial ICA. Vertebral arteries: Moderate narrowing at the origin of the right vertebral artery, secondary to noncalcified plaque. Focal moderate stenosis of the right V2 (series 12, image 248) at the level of C6, as well as in the right C5-C6 foramen, secondary to degenerative changes (series 12, image 242). The right vertebral artery is otherwise patent. The left vertebral artery demonstrates mild calcified narrowing in the V2 segment, but is otherwise patent. Codominant. Skeleton: Multilevel degenerative changes in the cervical spine, with trace retrolisthesis of C5 on C6. No acute osseous abnormality. Other neck: Negative. Upper chest: Dependent atelectasis. Review of the MIP images confirms the above findings CTA HEAD FINDINGS Evaluation is somewhat limited by degree of venous contamination. Anterior circulation: The left intracranial internal carotid artery is non-opacified until the ophthalmic and supraclinoid portions (series 12, image 111). Calcifications in the right intracranial internal carotid artery, which cause moderate narrowing in the distal cavernous segment and proximal supraclinoid segment. Occlusion of the left A1 (series 12, image 100), with a small area of filling in the distal left A1, likely retrograde. Patent right A1. Normal anterior communicating artery. Anterior cerebral arteries are patent to their distal aspects. No M1 stenosis or occlusion. Normal MCA bifurcations. Distal MCA branches perfused and symmetric. Posterior circulation: Vertebral arteries patent to the vertebrobasilar junction, with mild calcifications and irregularity in the right V4 segment, prior to the PICA takeoff (series 12, image 145), which causes mild-to-moderate narrowing. Mild to moderate narrowing in the left V4, proximal to the left PICA takeoff (series 12, image 144). Posterior inferior cerebral arteries  patent bilaterally. Basilar patent to its distal aspect. Superior cerebellar arteries patent bilaterally. Bilateral P1 segments originate from the basilar artery. PCAs perfused to their distal aspects, with mild narrowing in the left P1/P2 and poor opacification of the distal left P2 and P3 segments. The bilateral posterior communicating arteries are not definitively seen. Venous sinuses: As permitted by contrast timing, patent. Anatomic variants: None significant Review of the MIP images confirms the above findings IMPRESSION: 1. 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 The left intracranial internal carotid artery reconstitutes in the ophthalmic and supraclinoid portions. 2. Moderate narrowing in the distal right cavernous ICA and proximal supraclinoid segment. 3. Occlusion of the left A1.  The left ACA is otherwise patent. 4. Moderate stenosis at the origin of the right vertebral artery, with additional focal moderate stenosis of the right V2 at the level of C6, both secondary to atherosclerotic plaque and degenerative changes. Mild-to-moderate stenosis of the bilateral V4 segments. 5. Poor opacification of the distal left P2 and P3 segments. These results were called by telephone at the time of interpretation on 01/14/2022 at 8:48 pm to provider DR. DOUTOVA , who verbally acknowledged these results. Electronically Signed   By: Merilyn Baba M.D.   On: 01/14/2022 20:48  MR BRAIN WO CONTRAST  Result Date: 01/14/2022 CLINICAL DATA:  Neuro deficit, acute, stroke suspected. EXAM: MRI HEAD WITHOUT CONTRAST TECHNIQUE: Multiplanar, multiecho pulse sequences of the brain and surrounding structures were obtained without intravenous contrast. COMPARISON:  Head CT same day FINDINGS: Brain: Diffusion imaging shows multiple small acute infarctions within the left hemisphere scattered within the MCA territory which could either be due to micro embolic infarctions or watershed  infarctions. Micro embolic infarctions are favored. No large confluent infarction. No mass effect or swelling. Elsewhere, the brainstem and cerebellum are normal. Cerebral hemispheres show mild to moderate chronic small-vessel ischemic changes of the white matter. No mass lesion, hemorrhage, hydrocephalus or extra-axial collection. Vascular: Major vessels at the base of the brain show flow. 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 This pattern could be due to micro embolic infarction or watershed infarction. No large confluent infarction. No mass effect or hemorrhage. Moderate chronic small-vessel ischemic changes elsewhere affecting the cerebral hemispheric white matter. Electronically Signed   By: Nelson Chimes M.D.   On: 01/14/2022 17:58   IR US Guide Vasc Access Right  Result Date: 01/17/2022 CLINICAL DATA:  History of transient ischemic attack comprising of right hand weakness with slurred speech evaluation of the brain revealed numerous diffusion-weighted abnormalities in the left middle cerebral artery distribution. CT of the head and neck revealed left internal carotid artery proximal occlusion and questionable string sign. EXAM: BILATERAL COMMON CAROTID AND INNOMINATE ANGIOGRAPHY COMPARISON:  CT angiogram of the head and neck of January 14, 2022. MEDICATIONS: Heparin 2000 units IV. No antibiotic was administered within 1 hour of the procedure. ANESTHESIA/SEDATION: Versed 1 mg IV; Fentanyl 25 mcg IV Moderate Sedation Time:  35 minutes The patient was continuously monitored during the procedure by the interventional radiology nurse under my direct supervision. CONTRAST:  Omnipaque 300 approximately 65 mL. FLUOROSCOPY TIME:  Fluoroscopy Time: 13 minutes 6 seconds (1170 mGy). COMPLICATIONS: None immediate. TECHNIQUE:  Informed written consent was obtained from the patient after a thorough discussion of the procedural risks, benefits and alternatives. All questions were addressed. Maximal Sterile Barrier Technique was utilized including caps, mask, sterile gowns, sterile gloves, sterile drape, hand hygiene and skin antiseptic. A timeout was performed prior to the initiation of the procedure. The right forearm to the wrist was prepped and draped in usual sterile manner. The right radial artery was then identified with ultrasound, and its morphology documented permanently in the radiology PACS system. A dorsal palmar anastomosis was verified to be present. Using ultrasound guidance and micropuncture set access into the right radial artery was obtained with a 4/5 French radial sheath over a 0.018 inch micro guidewire. The guidewire and obturator were removed. Good aspiration was obtained from the side port of the radial sheath. A cocktail of 2000 units of heparin, 2.5 mg of verapamil, and 200 mcg of nitroglycerin was then infused in diluted form through sheath without event. A right radial arteriogram was performed. Over a 0.035 inch Roadrunner guidewire, a 5 [place] Simmons 2 diagnostic catheter was then advanced to the aortic arch region, selectively positioned in the origin of the right vertebral artery, the right common carotid artery, the left common carotid artery and the left vertebral artery. A wrist band was then applied for hemostasis at the right radial puncture site. Distal right radial pulse was verified to be present. FINDINGS: The right vertebral artery  origin demonstrates a severe stenosis. More distally, the vessel is seen to opacify to the cranial skull base. There is approximately 50% stenosis of the right vertebrobasilar junction just proximal to the origin of the right posterior-inferior cerebellar artery. Distal to this contrast is seen in the basilar artery and the posterior cerebral arteries. Mixed with  unopacified blood is seen in the basilar artery from the contralateral vertebral artery. The right common carotid arteriogram demonstrates the right external carotid artery and its major branches to be widely patent. The right internal carotid artery has an eccentric smooth atherosclerotic plaque along the lateral aspect just distal to the bulb with approximately 20% stenosis by the NASCET criteria. More distally, the vessel is seen to opacify to the cranial skull base. Mild caliber irregularity probably related to arteriosclerosis is seen of the cavernous segment of the right internal carotid artery. The right middle cerebral artery and the right anterior cerebral artery opacify into the capillary and venous phases. Prompt opacification of the left anterior cerebral A2 segment and distally is seen via anterior communicating artery. A small infundibulum is evident in the right posterior communicating artery. The left common carotid arteriogram demonstrates the left external carotid artery and its major branches to be patent. Left internal carotid artery at the bulb and its proximal 1/3 demonstrates slow ascent of contrast to near complete occlusion. The delayed images demonstrated ascent of contrast to the distal cervical left ICA. More distally, reconstitution of the left internal carotid artery and the supraclinoid segment is noted from left external carotid artery branches via the ipsilateral ophthalmic artery. Opacification of the right left middle cerebral artery distribution is evident. No opacification of the left anterior cerebral artery is evident. The left vertebral artery origin is widely patent. The vessel is seen to opacify to the cranial skull base. Patency is seen of the left vertebrobasilar junction with a 50%-60% stenosis of the left vertebrobasilar junction just proximal to the origin of the left posterior-inferior cerebellar artery. Mild caliber irregularity of the proximal PICA suggests  intracranial arteriosclerosis. More distally, opacification is seen of the basilar artery, the posterior cerebral arteries, the superior cerebellar arteries and the anterior-inferior cerebellar arteries into the capillary and venous phases. Unopacified blood is seen in the basilar artery from the contralateral vertebral artery. 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 Partial reconstitution of the left internal carotid artery supraclinoid segment from the ipsilateral ophthalmic artery. Non opacification of the left anterior cerebral artery A1 segment. 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 Approximately 50% to 60% stenosis of the dominant left vertebrobasilar junction just proximal to the left posterior-inferior cerebellar artery. Approximately 50% stenosis of the right vertebrobasilar junction proximal to the right posteroinferior cerebellar artery. PLAN: Finding discussed with the patient and the referring neurologist. Electronically Signed   By: Luanne Bras M.D.   On: 01/17/2022 08:16   DG Chest Port 1 View  Result Date: 01/15/2022 CLINICAL DATA:  Weakness. EXAM: PORTABLE CHEST 1 VIEW COMPARISON:  09/06/2021 FINDINGS: Dialysis catheter with tip at the upper cavoatrial junction. There is no edema, consolidation, effusion, or pneumothorax. Artifact from EKG leads. Normal heart size and mediastinal contours. IMPRESSION: No evidence of active disease. Electronically Signed   By: Jorje Guild M.D.   On: 01/15/2022 07:43   ECHOCARDIOGRAM COMPLETE  Result Date: 01/16/2022    ECHOCARDIOGRAM REPORT   Patient Name:  [name] Date of Exam: 01/16/2022 Medical Rec #:  [id number]    Height:       70.0 in Accession #:    [id number]   Weight:       163.1 lb Date of Birth:  1951/08/22     BSA:          1.914 m Patient Age:    89 years     BP:           128/79  mmHg Patient Gender: M            HR:           83 bpm. Exam Location:  Inpatient Procedure: 2D Echo Indications:    Stroke  History:        Patient has prior history of Echocardiogram examinations, most                 recent 09/08/2021.  Sonographer:    [name] Referring Phys: [place]  1. Left ventricular ejection fraction, by estimation, is 55 to 60%. The left ventricle has normal function. The left ventricle has no regional wall motion abnormalities. There is mild left ventricular hypertrophy. Left ventricular diastolic parameters are consistent with Grade I diastolic dysfunction (impaired relaxation).  2. Right ventricular systolic function is normal. The right ventricular size is normal. Tricuspid regurgitation signal is inadequate for assessing PA pressure.  3. The mitral valve is normal in structure. Trivial mitral valve regurgitation. No evidence of mitral stenosis.  4. The aortic valve is tricuspid. Aortic valve regurgitation is not visualized. Aortic valve sclerosis/calcification is present, without any evidence of aortic stenosis.  5. Aortic dilatation noted. There is mild dilatation of the ascending aorta, measuring 38 mm.  6. The inferior vena cava is normal in size with greater than 50% respiratory variability, suggesting right atrial pressure of 3 mmHg.  7. A small pericardial effusion is present. FINDINGS  Left Ventricle: Left ventricular ejection fraction, by estimation, is 55 to 60%. The left ventricle has normal function. The left ventricle has no regional wall motion abnormalities. The left ventricular internal cavity size was normal in size. There is  mild left ventricular hypertrophy. Left ventricular diastolic parameters are consistent with Grade I diastolic dysfunction (impaired relaxation). Right Ventricle: The right ventricular size is normal. No increase in right ventricular wall thickness. Right ventricular systolic function is normal. Tricuspid  regurgitation signal is inadequate for assessing PA pressure. Left Atrium: Left atrial size was normal in size. Right Atrium: Right atrial size was normal in size. Pericardium: A small pericardial effusion is present. Mitral Valve: The mitral valve is normal in structure. There is mild calcification of the mitral valve leaflet(s). Mild mitral annular calcification. Trivial mitral valve regurgitation. No evidence of mitral valve stenosis. Tricuspid Valve: The tricuspid valve is normal in structure. Tricuspid valve regurgitation is not demonstrated. Aortic Valve: The aortic valve is tricuspid. Aortic valve regurgitation is not visualized. Aortic valve sclerosis/calcification is present, without any evidence of aortic stenosis. Aortic valve mean gradient measures 5.0 mmHg. Aortic valve peak gradient measures 9.0 mmHg. Aortic valve area, by VTI measures 1.94 cm. Pulmonic Valve: The pulmonic valve was normal in structure. Pulmonic valve regurgitation is not visualized. Aorta: The aortic root is normal in size and structure and aortic dilatation noted. There is mild dilatation of the ascending aorta, measuring 38 mm. Venous: The inferior vena cava is normal in size with greater than 50% respiratory variability, suggesting right atrial pressure of  3 mmHg. IAS/Shunts: No atrial level shunt detected by color flow Doppler.  LEFT VENTRICLE PLAX 2D LVIDd:         5.00 cm   Diastology LVIDs:         3.80 cm   LV e' medial:    5.77 cm/s LV PW:         1.20 cm   LV E/e' medial:  8.4 LV IVS:        1.20 cm   LV e' lateral:   12.10 cm/s LVOT diam:     2.00 cm   LV E/e' lateral: 4.0 LV SV:         42 LV SV Index:   22 LVOT Area:     3.14 cm  RIGHT VENTRICLE RV Basal diam:  3.30 cm RV Mid diam:    2.50 cm RV S prime:     13.60 cm/s TAPSE (M-mode): 1.7 cm LEFT ATRIUM             Index        RIGHT ATRIUM           Index LA diam:        3.30 cm 1.72 cm/m   RA Area:     13.50 cm LA Vol (A2C):   42.2 ml 22.05 ml/m  RA Volume:   31.50  ml  16.46 ml/m LA Vol (A4C):   49.4 ml 25.81 ml/m LA Biplane Vol: 46.0 ml 24.03 ml/m  AORTIC VALVE AV Area (Vmax):    2.08 cm AV Area (Vmean):   1.97 cm AV Area (VTI):     1.94 cm AV Vmax:           150.00 cm/s AV Vmean:          99.300 cm/s AV VTI:            0.219 m AV Peak Grad:      9.0 mmHg AV Mean Grad:      5.0 mmHg LVOT Vmax:         99.25 cm/s LVOT Vmean:        62.150 cm/s LVOT VTI:          0.135 m LVOT/AV VTI ratio: 0.62  AORTA Ao Root diam: 3.40 cm Ao Asc diam:  3.80 cm MITRAL VALVE MV Area (PHT): 4.57 cm    SHUNTS MV Decel Time: 166 msec    Systemic VTI:  0.14 m MV E velocity: 48.40 cm/s  Systemic Diam: 2.00 cm MV A velocity: 67.70 cm/s MV E/A ratio:  0.71 Dalton McleanMD Electronically signed by Franki Monte Signature Date/Time: 01/16/2022/5:45:45 PM    Final    DG HIP UNILAT WITH PELVIS 2-3 VIEWS LEFT  Result Date: 01/16/2022 CLINICAL DATA:  Left hip pain.  No injury. EXAM: DG HIP (WITH OR WITHOUT PELVIS) 2-3V LEFT COMPARISON:  Hip x-ray 01/14/2022. FINDINGS: There is no evidence of hip fracture or dislocation. There is no evidence of arthropathy or other focal bone abnormality. IMPRESSION: Negative. Electronically Signed   By: Ronney Asters M.D.   On: 01/16/2022 19:24   DG HIPS BILAT WITH PELVIS MIN 5 VIEWS  Result Date: 01/14/2022 CLINICAL DATA:  Bilateral hip pain EXAM: DG HIP (WITH OR WITHOUT PELVIS) 5+V BILAT COMPARISON:  None. FINDINGS: There is no evidence of hip fracture or dislocation. There is no evidence of arthropathy or other focal bone abnormality. IMPRESSION: Negative. Electronically Signed   By: Fidela Salisbury M.D.   On: 01/14/2022 21:57  IR ANGIO INTRA EXTRACRAN SEL COM CAROTID INNOMINATE BILAT MOD SED  Result Date: 01/17/2022 CLINICAL DATA:  History of transient ischemic attack comprising of right hand weakness with slurred speech evaluation of the brain revealed numerous diffusion-weighted abnormalities in the left middle cerebral artery distribution. CT of  the head and neck revealed left internal carotid artery proximal occlusion and questionable string sign. EXAM: BILATERAL COMMON CAROTID AND INNOMINATE ANGIOGRAPHY COMPARISON:  CT angiogram of the head and neck of January 14, 2022. MEDICATIONS: Heparin 2000 units IV. No antibiotic was administered within 1 hour of the procedure. ANESTHESIA/SEDATION: Versed 1 mg IV; Fentanyl 25 mcg IV Moderate Sedation Time:  35 minutes The patient was continuously monitored during the procedure by the interventional radiology nurse under my direct supervision. CONTRAST:  Omnipaque 300 approximately 65 mL. FLUOROSCOPY TIME:  Fluoroscopy Time: 13 minutes 6 seconds (1170 mGy). COMPLICATIONS: None immediate. TECHNIQUE: Informed written consent was obtained from the patient after a thorough discussion of the procedural risks, benefits and alternatives. All questions were addressed. Maximal Sterile Barrier Technique was utilized including caps, mask, sterile gowns, sterile gloves, sterile drape, hand hygiene and skin antiseptic. A timeout was performed prior to the initiation of the procedure. The right forearm to the wrist was prepped and draped in usual sterile manner. The right radial artery was then identified with ultrasound, and its morphology documented permanently in the radiology PACS system. A dorsal palmar anastomosis was verified to be present. Using ultrasound guidance and micropuncture set access into the right radial artery was obtained with a 4/5 French radial sheath over a 0.018 inch micro guidewire. The guidewire and obturator were removed. Good aspiration was obtained from the side port of the radial sheath. A cocktail of 2000 units of heparin, 2.5 mg of verapamil, and 200 mcg of nitroglycerin was then infused in diluted form through sheath without event. A right radial arteriogram was performed. Over a 0.035 inch Roadrunner guidewire, a 5 [place] Simmons 2 diagnostic catheter was then advanced to the aortic arch region,  selectively positioned in the origin of the right vertebral artery, the right common carotid artery, the left common carotid artery and the left vertebral artery. A wrist band was then applied for hemostasis at the right radial puncture site. Distal right radial pulse was verified to be present. FINDINGS: The right vertebral artery origin demonstrates a severe stenosis. More distally, the vessel is seen to opacify to the cranial skull base. There is approximately 50% stenosis of the right vertebrobasilar junction just proximal to the origin of the right posterior-inferior cerebellar artery. Distal to this contrast is seen in the basilar artery and the posterior cerebral arteries. Mixed with unopacified blood is seen in the basilar artery from the contralateral vertebral artery. The right common carotid arteriogram demonstrates the right external carotid artery and its major branches to be widely patent. The right internal carotid artery has an eccentric smooth atherosclerotic plaque along the lateral aspect just distal to the bulb with approximately 20% stenosis by the NASCET criteria. More distally, the vessel is seen to opacify to the cranial skull base. Mild caliber irregularity probably related to arteriosclerosis is seen of the cavernous segment of the right internal carotid artery. The right middle cerebral artery and the right anterior cerebral artery opacify into the capillary and venous phases. Prompt opacification of the left anterior cerebral A2 segment and distally is seen via anterior communicating artery. A small infundibulum is evident in the right posterior communicating artery. The left common carotid arteriogram demonstrates  the left external carotid artery and its major branches to be patent. Left internal carotid artery at the bulb and its proximal 1/3 demonstrates slow ascent of contrast to near complete occlusion. The delayed images demonstrated ascent of contrast to the distal cervical left  ICA. More distally, reconstitution of the left internal carotid artery and the supraclinoid segment is noted from left external carotid artery branches via the ipsilateral ophthalmic artery. Opacification of the right left middle cerebral artery distribution is evident. No opacification of the left anterior cerebral artery is evident. The left vertebral artery origin is widely patent. The vessel is seen to opacify to the cranial skull base. Patency is seen of the left vertebrobasilar junction with a 50%-60% stenosis of the left vertebrobasilar junction just proximal to the origin of the left posterior-inferior cerebellar artery. Mild caliber irregularity of the proximal PICA suggests intracranial arteriosclerosis. More distally, opacification is seen of the basilar artery, the posterior cerebral arteries, the superior cerebellar arteries and the anterior-inferior cerebellar arteries into the capillary and venous phases. Unopacified blood is seen in the basilar artery from the contralateral vertebral artery. 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 Partial reconstitution of the left internal carotid artery supraclinoid segment from the ipsilateral ophthalmic artery. Non opacification of the left anterior cerebral artery A1 segment. 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 Approximately 50% to 60% stenosis of the dominant left vertebrobasilar junction just proximal to the left posterior-inferior cerebellar artery. Approximately 50% stenosis of the right vertebrobasilar junction proximal to the right posteroinferior cerebellar artery. PLAN: Finding discussed with the patient and the referring neurologist. Electronically Signed   By: Luanne Bras M.D.   On: 01/17/2022 08:16   IR ANGIO VERTEBRAL SEL SUBCLAVIAN INNOMINATE BILAT MOD SED  Result Date:  01/17/2022 CLINICAL DATA:  History of transient ischemic attack comprising of right hand weakness with slurred speech evaluation of the brain revealed numerous diffusion-weighted abnormalities in the left middle cerebral artery distribution. CT of the head and neck revealed left internal carotid artery proximal occlusion and questionable string sign. EXAM: BILATERAL COMMON CAROTID AND INNOMINATE ANGIOGRAPHY COMPARISON:  CT angiogram of the head and neck of January 14, 2022. MEDICATIONS: Heparin 2000 units IV. No antibiotic was administered within 1 hour of the procedure. ANESTHESIA/SEDATION: Versed 1 mg IV; Fentanyl 25 mcg IV Moderate Sedation Time:  35 minutes The patient was continuously monitored during the procedure by the interventional radiology nurse under my direct supervision. CONTRAST:  Omnipaque 300 approximately 65 mL. FLUOROSCOPY TIME:  Fluoroscopy Time: 13 minutes 6 seconds (1170 mGy). COMPLICATIONS: None immediate. TECHNIQUE: Informed written consent was obtained from the patient after a thorough discussion of the procedural risks, benefits and alternatives. All questions were addressed. Maximal Sterile Barrier Technique was utilized including caps, mask, sterile gowns, sterile gloves, sterile drape, hand hygiene and skin antiseptic. A timeout was performed prior to the initiation of the procedure. The right forearm to the wrist was prepped and draped in usual sterile manner. The right radial artery was then identified with ultrasound, and its morphology documented permanently in the radiology PACS system. A dorsal palmar anastomosis was verified to be present. Using ultrasound guidance and micropuncture set access into the right radial artery was obtained with a 4/5 French radial sheath over a 0.018 inch micro guidewire. The guidewire and obturator were removed. Good aspiration was obtained from the side port  of the radial sheath. A cocktail of 2000 units of heparin, 2.5 mg of verapamil, and 200 mcg  of nitroglycerin was then infused in diluted form through sheath without event. A right radial arteriogram was performed. Over a 0.035 inch Roadrunner guidewire, a 5 [place] Simmons 2 diagnostic catheter was then advanced to the aortic arch region, selectively positioned in the origin of the right vertebral artery, the right common carotid artery, the left common carotid artery and the left vertebral artery. A wrist band was then applied for hemostasis at the right radial puncture site. Distal right radial pulse was verified to be present. FINDINGS: The right vertebral artery origin demonstrates a severe stenosis. More distally, the vessel is seen to opacify to the cranial skull base. There is approximately 50% stenosis of the right vertebrobasilar junction just proximal to the origin of the right posterior-inferior cerebellar artery. Distal to this contrast is seen in the basilar artery and the posterior cerebral arteries. Mixed with unopacified blood is seen in the basilar artery from the contralateral vertebral artery. The right common carotid arteriogram demonstrates the right external carotid artery and its major branches to be widely patent. The right internal carotid artery has an eccentric smooth atherosclerotic plaque along the lateral aspect just distal to the bulb with approximately 20% stenosis by the NASCET criteria. More distally, the vessel is seen to opacify to the cranial skull base. Mild caliber irregularity probably related to arteriosclerosis is seen of the cavernous segment of the right internal carotid artery. The right middle cerebral artery and the right anterior cerebral artery opacify into the capillary and venous phases. Prompt opacification of the left anterior cerebral A2 segment and distally is seen via anterior communicating artery. A small infundibulum is evident in the right posterior communicating artery. The left common carotid arteriogram demonstrates the left external carotid  artery and its major branches to be patent. Left internal carotid artery at the bulb and its proximal 1/3 demonstrates slow ascent of contrast to near complete occlusion. The delayed images demonstrated ascent of contrast to the distal cervical left ICA. More distally, reconstitution of the left internal carotid artery and the supraclinoid segment is noted from left external carotid artery branches via the ipsilateral ophthalmic artery. Opacification of the right left middle cerebral artery distribution is evident. No opacification of the left anterior cerebral artery is evident. The left vertebral artery origin is widely patent. The vessel is seen to opacify to the cranial skull base. Patency is seen of the left vertebrobasilar junction with a 50%-60% stenosis of the left vertebrobasilar junction just proximal to the origin of the left posterior-inferior cerebellar artery. Mild caliber irregularity of the proximal PICA suggests intracranial arteriosclerosis. More distally, opacification is seen of the basilar artery, the posterior cerebral arteries, the superior cerebellar arteries and the anterior-inferior cerebellar arteries into the capillary and venous phases. Unopacified blood is seen in the basilar artery from the contralateral vertebral artery. 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 Partial reconstitution of the left internal carotid artery supraclinoid segment from the ipsilateral ophthalmic artery. Non opacification of the left anterior cerebral artery A1 segment. 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 Approximately 50% to 60% stenosis of the dominant left vertebrobasilar junction just proximal to the left posterior-inferior cerebellar artery. Approximately 50% stenosis of the right vertebrobasilar junction proximal to the right posteroinferior  cerebellar artery. PLAN:  Finding discussed with the patient and the referring neurologist. Electronically Signed   By: Luanne Bras M.D.   On: 01/17/2022 08:16      PHYSICAL EXAM  Temp:  [97.6 F (36.4 C)-98.6 F (37 C)] 97.9 F (36.6 C) (01/26 1600) Pulse Rate:  [75-88] 77 (01/26 1600) Resp:  [16-18] 16 (01/26 1600) BP: (91-124)/(66-89) 119/71 (01/26 1600) SpO2:  [95 %-100 %] 99 % (01/26 1600)  General - Well nourished, well developed, in no apparent distress.  Ophthalmologic - fundi not visualized due to noncooperation.  Cardiovascular - Regular rhythm and rate.  Mental Status -  Level of arousal and orientation to time, place, and person were intact. Language including expression, naming, repetition, comprehension was assessed and found intact. Fund of Knowledge was assessed and was intact.  Cranial Nerves II - XII - II - Visual field intact OU. III, IV, VI - Extraocular movements intact. V - Facial sensation intact bilaterally. VII - Facial movement intact bilaterally. VIII - Hearing & vestibular intact bilaterally. X - Palate elevates symmetrically. XI - Chin turning & shoulder shrug intact bilaterally. XII - Tongue protrusion intact.  Motor Strength - The patients strength was normal in all extremities and pronator drift was absent.  Bulk was normal and fasciculations were absent.   Motor Tone - Muscle tone was assessed at the neck and appendages and was normal.  Reflexes - The patients reflexes were symmetrical in all extremities and he had no pathological reflexes.  Sensory - Light touch, temperature/pinprick were assessed and were symmetrical.    Coordination - The patient had normal movements in the hands and feet with no ataxia or dysmetria.  Tremor was absent.  Gait and Station - deferred.   ASSESSMENT/PLAN [name] is a 71 y.o. 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 No tPA given due to OOW.    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  Left A1 occlusion. MRI left MCA scattered infarcts Cerebral angiogram Occluded left ICA prox with a delayed  string sign. Partial reconstitution of left ICA supraclinoid from left ECA branches via left Ophthalmic A.  Severe proximal right VA origin stenosis, 50% bilateral VB junction stenosis. 2D Echo EF 55 to 60% LDL 85.5, TG 535 HgbA1c 5.6 SCDs for VTE prophylaxis No antithrombotic prior to admission, now on aspirin 81 and Brilinta. Patient counseled to be compliant with his antithrombotic medications Ongoing aggressive stroke risk factor management Therapy recommendations: None Disposition: Pending  Carotid stenosis/occlusion CTA head and neck left ICA occlusion with reconstituted at terminal, moderate stenosis right ICA siphon, Cerebral angiogram Occluded left ICA prox with a delayed  string sign. Partial reconstitution of left ICA supraclinoid from left ECA branches via left Ophthalmic A.   Plan for possible left ICA angioplasty/stenting tomorrow.  AL amyloidosis Follows with Dr. Burr Medico at cancer center On chemotherapy Has renal, heart and neuro involvement ESRD on hemodialysis Orthostatic hypotension on midodrine Hx of CHF and mild cardiomyopathy EKG showed frequent PACs and tele showed frequent ectopy, no afib found  BP management Stable Avoid low BP Long term BP goal 130-150 given left ICA occlusion/high-grade stenosis  Hyperlipidemia Home meds: None LDL 85.5, goal < 70 Now on Lipitor 40 Continue statin at  discharge  Tobacco abuse Current smoker Smoking cessation counseling provided Pt will consider to quit  Other Stroke Risk Factors Advanced  age  Other Active Problems Orthostatic hypotension on midodrine ESRD on HD Anemia of chronic disease, hemoglobin 9.4  Hospital day # 3    Rosalin Hawking, MD PhD Stroke Neurology 01/17/2022 4:36 PM    To contact Stroke Continuity provider, please refer to [email or web address] After hours, contact General Neurology

## 2022-01-17 NOTE — Progress Notes (Signed)
[facility] Progress Note  Subjective: alert and w/o complaitns today  Vitals:   01/16/22 2325 01/17/22 0326 01/17/22 0821 01/17/22 1113  BP: 109/66 124/84 118/72 121/69  Pulse: 75 86 84 87  Resp: 18 18 16 16   Temp: 98.3 F (36.8 C) 98.6 F (37 C) 98.3 F (36.8 C) 98.2 F (36.8 C)  TempSrc: Oral Oral Oral Oral  SpO2: 96% 95% 95% 98%  Weight:        Exam:  alert, nad   no jvd  Chest cta bilat  Cor reg no RG  Abd soft ntnd no ascites   Ext no LE edema   Alert, nonfocal, Ox 3  RIJ TDC intact      Home meds include - xanax prn, midodrine 5 tid, remeron, oxy IR prn, protonix, renvela 800 tid, flomax, ambien prn        OP HD: [facility] MWF   4h  400/1.5  76.5kg  2/2 bath  RIJ TDC  Hep none   - venofer 100 x 10 (9 left)   - calcitriol 0.25 ug po tiw      Alb 3.2  Ca 8.3  tsat 47%     Hb 9.2       CXR 1/24 - IMPRESSION: No evidence of active disease.       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 Per Neuro/pmd.  ESRD - on HD MWF. Next HD Friday.  HTN/ vol - BP's low-normal, cont midodrine 5 tid. No vol excess on exam. At dry wt.   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 Tsat good at 47%.  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 Dispo - possibly going home today per the patient/ sig other         Kelly Splinter 01/17/2022, 12:13 PM   Recent Labs  Lab 01/14/22 1342 01/14/22 1349 01/15/22 1514 01/16/22 0355  K 4.5 4.5  --  5.2*  BUN 32* 30*  --  38*  CREATININE 3.68* 3.90*  --  4.73*  CALCIUM 8.3*  --   --  8.5*  PHOS  --   --  5.7*  --   HGB 9.4* 9.2*  --  8.5*    Inpatient medications:   stroke: mapping our early stages of recovery book   Does not apply Once   aspirin EC  325 mg Oral Daily   atorvastatin  40 mg Oral QHS   calcitRIOL  0.25 mcg Oral Q M,W,F-HD   Chlorhexidine Gluconate Cloth  6 each Topical  Q0600   clopidogrel  75 mg Oral Daily   folic acid  2 mg Oral q AM   midodrine  5 mg Oral TID WC   mirtazapine  30 mg Oral QHS   pantoprazole  40 mg Oral BID AC   sevelamer carbonate  800 mg Oral TID WC   tamsulosin  0.4 mg Oral q AM    acetaminophen **OR** acetaminophen (TYLENOL) oral liquid 160 mg/5 mL **OR** acetaminophen, ALPRAZolam, ALPRAZolam, HYDROmorphone (DILAUDID) injection, iohexol, oxyCODONE

## 2022-01-18 ENCOUNTER — Other Ambulatory Visit (HOSPITAL_COMMUNITY): Payer: Self-pay

## 2022-01-18 ENCOUNTER — Inpatient Hospital Stay (HOSPITAL_COMMUNITY): Payer: Commercial Managed Care - PPO

## 2022-01-18 ENCOUNTER — Encounter (HOSPITAL_COMMUNITY): Payer: Self-pay | Admitting: Internal Medicine

## 2022-01-18 ENCOUNTER — Inpatient Hospital Stay (HOSPITAL_COMMUNITY): Payer: Commercial Managed Care - PPO | Admitting: Certified Registered Nurse Anesthetist

## 2022-01-18 ENCOUNTER — Encounter (HOSPITAL_COMMUNITY)
Admission: EM | Disposition: A | Discharge status: Home / Self Care | Payer: Self-pay | Source: Home / Self Care | Attending: Internal Medicine

## 2022-01-18 DIAGNOSIS — I6522 Occlusion and stenosis of left carotid artery: Secondary | ICD-10-CM | POA: Diagnosis present

## 2022-01-18 HISTORY — PX: IR CT HEAD LTD: IMG2386

## 2022-01-18 HISTORY — PX: RADIOLOGY WITH ANESTHESIA: SHX6223

## 2022-01-18 HISTORY — PX: IR PERCUTANEOUS ART THROMBECTOMY/INFUSION INTRACRANIAL INC DIAG ANGIO: IMG6087

## 2022-01-18 LAB — CBC
HCT: 24.8 % — ABNORMAL LOW (ref 39.0–52.0)
Hemoglobin: 8.2 g/dL — ABNORMAL LOW (ref 13.0–17.0)
MCH: 29.9 pg (ref 26.0–34.0)
MCHC: 33.1 g/dL (ref 30.0–36.0)
MCV: 90.5 fL (ref 80.0–100.0)
Platelets: 223 10*3/uL (ref 150–400)
RBC: 2.74 MIL/uL — ABNORMAL LOW (ref 4.22–5.81)
RDW: 17.4 % — ABNORMAL HIGH (ref 11.5–15.5)
WBC: 7.1 10*3/uL (ref 4.0–10.5)
nRBC: 0 % (ref 0.0–0.2)

## 2022-01-18 LAB — RENAL FUNCTION PANEL
Albumin: 2.9 g/dL — ABNORMAL LOW (ref 3.5–5.0)
Anion gap: 8 (ref 5–15)
BUN: 27 mg/dL — ABNORMAL HIGH (ref 8–23)
CO2: 21 mmol/L — ABNORMAL LOW (ref 22–32)
Calcium: 8.6 mg/dL — ABNORMAL LOW (ref 8.9–10.3)
Chloride: 106 mmol/L (ref 98–111)
Creatinine, Ser: 4.72 mg/dL — ABNORMAL HIGH (ref 0.61–1.24)
GFR, Estimated: 12 mL/min — ABNORMAL LOW (ref >60)
Glucose, Bld: 95 mg/dL (ref 70–99)
Phosphorus: 5 mg/dL — ABNORMAL HIGH (ref 2.5–4.6)
Potassium: 4.7 mmol/L (ref 3.5–5.1)
Sodium: 135 mmol/L (ref 135–145)

## 2022-01-18 LAB — POCT ACTIVATED CLOTTING TIME: Activated Clotting Time: 161 seconds

## 2022-01-18 LAB — SURGICAL PCR SCREEN
MRSA, PCR: NEGATIVE
Staphylococcus aureus: POSITIVE — AB

## 2022-01-18 IMAGING — XA IR PERCUTANEOUS ART THORMBECTOMY/INFUSION INTRACRANIAL INCLUDE D
1 series · 9 of 24 positions shown · IV contrast (IODINE)
Comparison: Diagnostic catheter arteriogram [DATE].

CLINICAL DATA: Symptomatic left-sided internal carotid artery
occlusion with string sign.

EXAM:
IR PERCUTANEOUS ART THROMBECTOMY/INFUSION INTRACRANIAL INCLUDE DIAG
ANGIO
TECHNIQUE: Informed written consent was obtained from the patient after a
thorough discussion of the procedural risks, benefits and
alternatives. All questions were addressed. Maximal Sterile Barrier
Technique was utilized including caps, mask, sterile gowns, sterile
gloves, sterile drape, hand hygiene and skin antiseptic. A timeout
was performed prior to the initiation of the procedure.

[Series 300: dr. (person_name) · 9 of 456 slices shown]
[im 20/456]
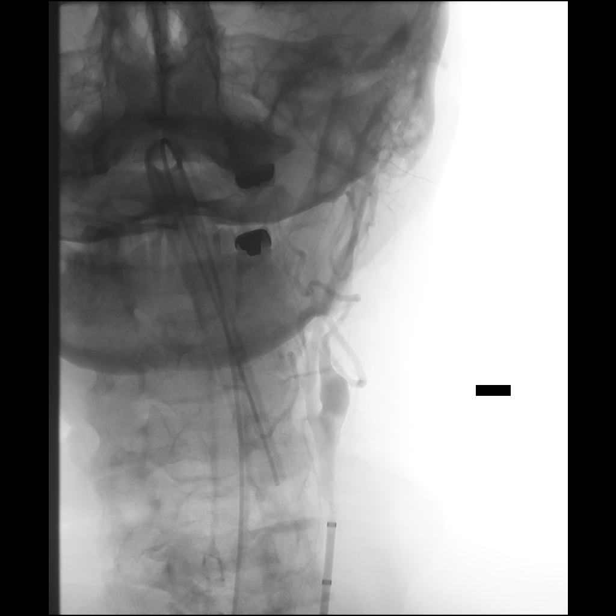
[im 80/456]
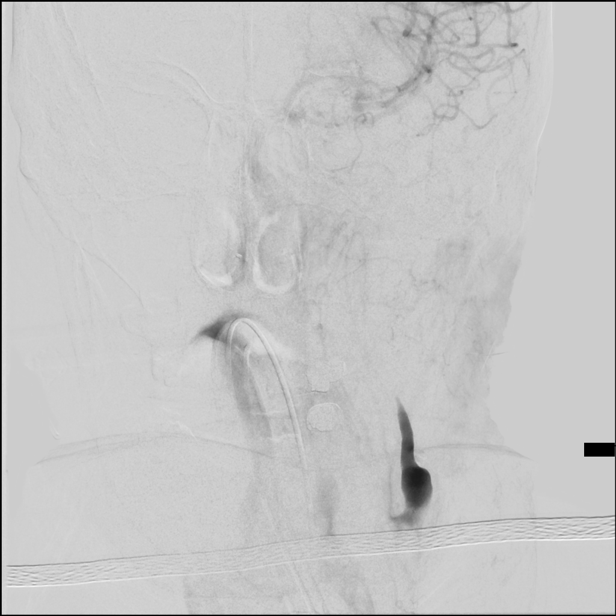
[im 119/456]
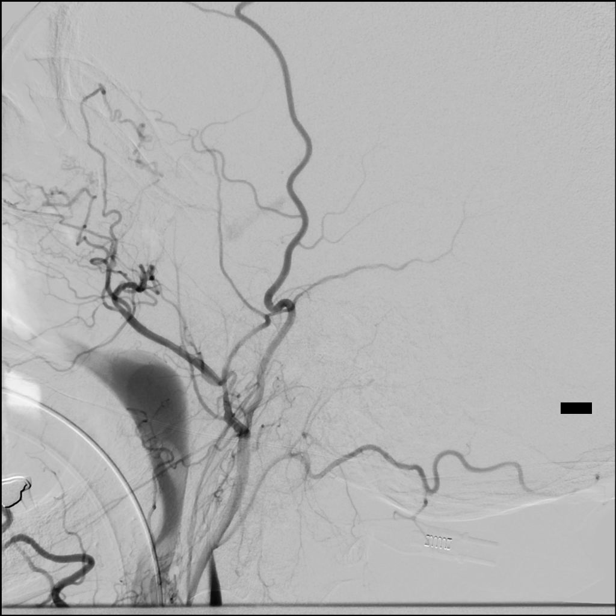
[im 179/456]
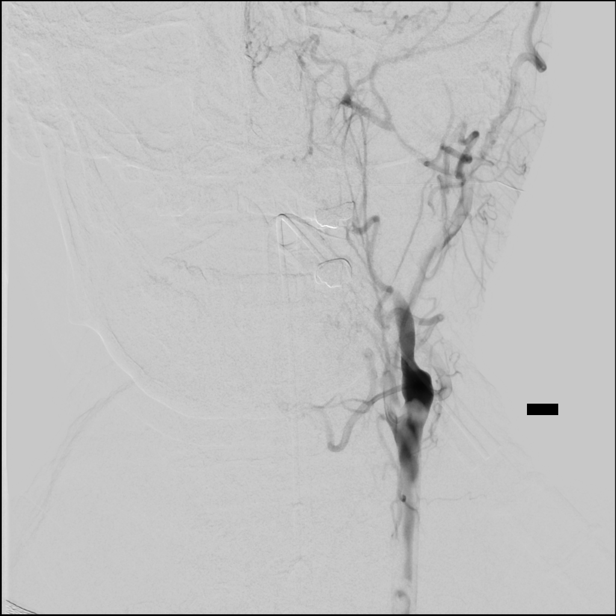
[im 238/456]
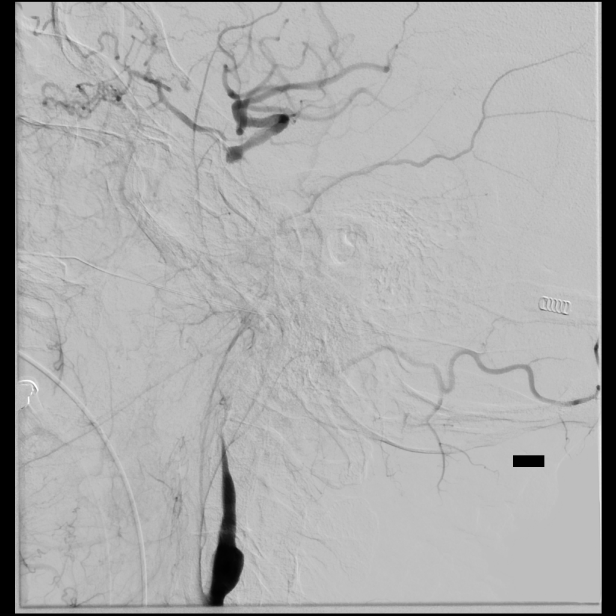
[im 277/456]
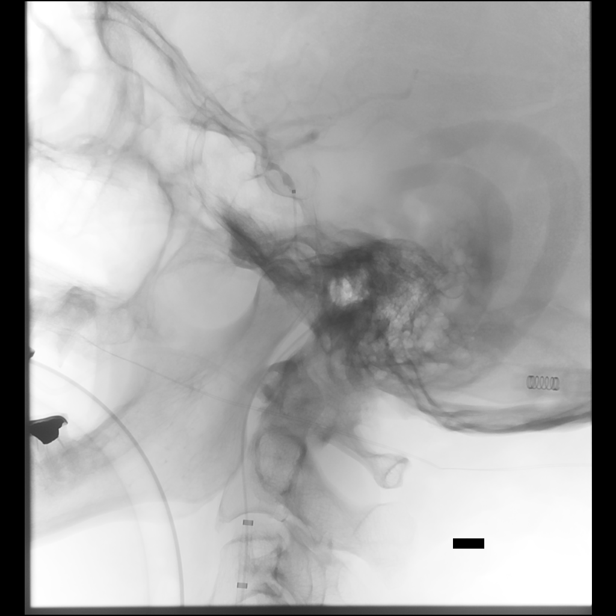
[im 337/456]
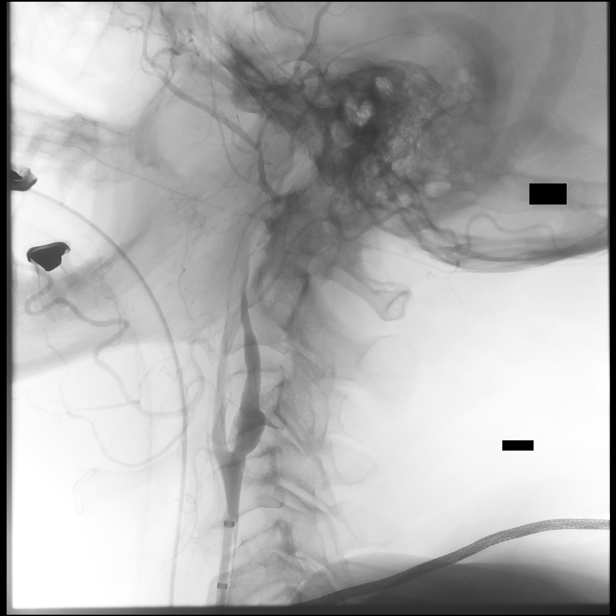
[im 396/456]
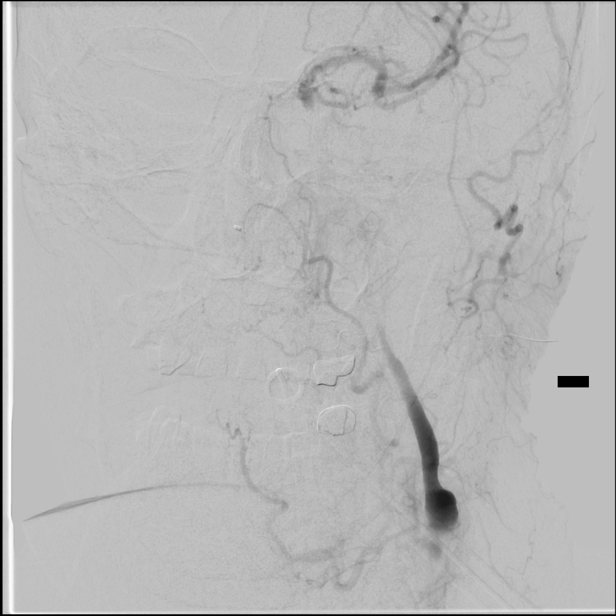
[im 436/456]
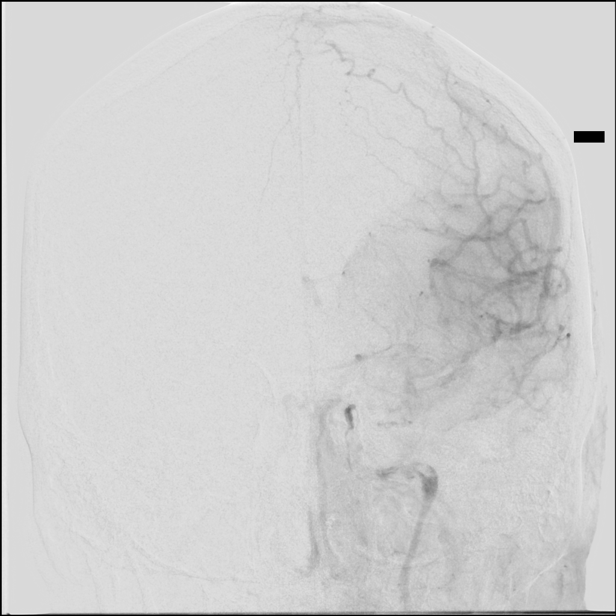

[9 of 24 positions shown; findings below may reference images not displayed]

MEDICATIONS:
Heparin 3,000 units IV. Ancef 2 g IV antibiotic was administered
within 1 hour of the procedure.

ANESTHESIA/SEDATION:
General anesthesia.

CONTRAST:  Omnipaque 300 approximately 150 mL.

FLUOROSCOPY TIME:  Fluoroscopy Time: 68 minutes 18 seconds ([RT]
mGy).

COMPLICATIONS:
None immediate.
The right groin was prepped and draped in the usual sterile fashion.
Thereafter using modified Seldinger technique, transfemoral access
into the right common femoral artery was obtained without
difficulty. Over a 0.035 inch guidewire, an 8 French Pinnacle 25 cm
sheath was inserted. Through this, and also over 0.035 inch
guidewire, a 5 French JB 1 catheter was advanced to the aortic arch
region and selectively positioned in the left common carotid artery.
FINDINGS: The left common femoral arteriogram again demonstrates the left
external carotid artery and its major branches to be widely patent.

The left internal carotid artery at the bulb and just distal to this
remains tapered in configuration with a delayed string sign to the
distal cervical segment of the left internal carotid artery.

More distally, reconstitution of the left internal carotid artery
distal cavernous, and the supraclinoid segment is seen via multiple
collaterals arising from the left external carotid artery via the
ipsilateral ophthalmic artery. Opacification is seen of the left
middle cerebral artery territory, without evidence of occlusions or
of intraluminal filling defects. Non opacification is seen of the
left anterior cerebral artery A1 segment.

ENDOVASCULAR REVASCULARIZATION OF OCCLUDED LEFT INTERNAL CAROTID
ARTERY WITH STRING SIGN

The diagnostic JB 1 catheter in the left common carotid artery was
then exchanged over a 0.035 inch 300 cm Rosen exchange guidewire for
an 087 95 cm balloon guide catheter which had been prepped with 50%
contrast and 50% heparinized saline infusion.

The guidewire was removed. Good aspiration obtained from hub of the
balloon guide catheter positioned just proximal to the left common
carotid artery bifurcation.

DSA roadmap was then obtained extra cranially and intracranially.
Over a 0.014 inch standard Synchro micro guidewire, with a moderate
the proximal left internal carotid artery.

At this time the balloon guide catheter was advanced into the bulb
of the left internal carotid artery.

Using a torque device, the micro guidewire was then advanced
accompanied by the microcatheter without resistance to the proximal
cavernous segment.

Using a torque device, micro guidewire was then advanced without
difficulty into the supraclinoid left ICA and left middle cerebral
artery distal M1 segment followed by the microcatheter. The
guidewire was removed. Good aspiration obtained from the hub of
microcatheter. A gentle control arteriogram performed through the
microcatheter demonstrated safe positioning of tip of the
microcatheter. This was then connected to continuous heparinized
saline infusion.

A 4 mm x 40 mm Solitaire X retrieval device was then advanced to the
distal end of microcatheter. The O ring on the microcatheter was
loosened. The retriever was then deployed by retrieving the
microcatheter. Proximal aspect of the vertebral device was imbedded
in the distal [DATE] of the occluded distal cavernous segment.

With proximal flow arrest in the proximal left internal carotid
artery with balloon guide catheter constant aspiration was applied
for approximately 2-1/2 minutes. Thereafter, the retrieval device,
with the microcatheter was then retrieved and removed. Following
reversal of flow arrest, a control arteriogram performed through the
balloon guide catheter in the left internal carotid proximally
demonstrated no change in the extracranial internal carotid artery
occlusion. A second pass was then made using a 6.5 mm x 4 5 cm
Embotrap retrieval device. Accessing the left middle cerebral artery
in the manner described above without any difficulty, the retriever
was deployed in the usual manner.

The proximal portion of the retriever was imbedded in the more
distal part of the occlusion. With proximal flow arrest in the left
internal carotid artery using the balloon guide catheter, and
constant aspiration being applied at the hub of the balloon guide
catheter using a Penumbra aspiration device, for approximately 2-1/2
minutes, the combination of the retrieval device, and microcatheter
were retrieved and removed. Following reversal of flow arrest, a
control arteriogram performed through the balloon guide catheter
again demonstrated no significant change in the occluded left
internal carotid extra cranially.

However, extensive clot was noted in the aspirate and the retrieval
device.

A third pass was made again using a 4.5 mm x 45 cm Embotrap
retrieval device again deployed in the manner as described above
without difficulty.

Again with proximal flow arrest in the left internal carotid artery
the balloon guide catheter and constant aspiration applied at the
hub of the balloon guide catheter, the combination of the retrieval
device and the balloon guide catheter were retrieved and removed.
Following reversal of flow arrest, a control arteriogram performed
through the balloon guide catheter now demonstrates slow ascent of
contrast to the cranial skull base to the proximal cavernous segment
and the caval segment. Slow flow in this region demonstrated
irregularity with intimal flap in the petrous distal petrous
segment.

Also noted was a high-grade stenosis related to intracranial
arteriosclerosis in the caval cavernous segment. Trickle flow was
noted into the distal cavernous and supraclinoid segments.

An 027 150 cm Phenom microcatheter was then advanced over a
inch standard Synchro micro guidewire to the supraclinoid left ICA.

The guidewire was removed. Brisk aspiration was obtained from the
hub of the microcatheter. Gentle control arteriogram performed
through this demonstrates safe positioning of the tip of the
microcatheter, which was then connected to continuous heparinized
saline infusion. Measurements were then performed of the left
internal carotid artery in the distal cavernous segment. It was
elected to proceed with placement of a 5 mm x 30 mm pipeline shield
flow diverter device in order to correct dissection.

This was then advanced to the distal end of the microcatheter. The
microcatheter was retrieved unsheathing the distal wire in the
proximal left M1 segment and a few mm of the distal retrieval
device. The combination was retrieved into the supraclinoid left
ICA.

Multiple attempts were then made to open and deploy the pipeline
flow diverter. The flow diverter filled to open completely in the
distal [DATE]. This was then recaptured and retrieved and removed. A
control arteriogram performed through the balloon guide catheter in
the proximal left internal carotid artery continued to demonstrates
slow flow to the high-grade stenosis related to iCAD in the caval
cavernous segment with progressive reocclusion.

More distally, the supraclinoid left ICA, and left middle cerebral
artery territory continued to be perfused via the ophthalmic artery
from multiple collaterals from the left external carotid artery
unchanged from prior to the procedure.

Given the patient's complete neurologic recovery prior to the
procedure, it was decided to stop the procedure given the increased
potential risks of thromboembolic complications with further
attempts at endovascular revascularization.

A final control arteriogram performed through the balloon guide
catheter in the proximal left internal carotid artery continued to
demonstrate progressive slow flow to the left internal carotid
artery cavernous segment with continued adequate reconstitution of
the left internal carotid artery supraclinoid segment, and left
middle cerebral artery distribution. The balloon guide was removed.
An 8 French sheath was removed with successful hemostasis at the
right groin puncture site with an 8 French Angio-Seal closure
device. Distal pulses remained Dopplerable unchanged.

A flat panel CT of the brain demonstrated no evidence of
intracranial hemorrhage or mass effect. The patient's general
anesthesia was then reversed, and patient was extubated without
event.

Neurologically, the patient complained of no headaches, nausea or
vomiting. He had mild dysarthria which cleared.

Patient was able to move all fours adequately. He was then
transferred to PACU and then neuro ICU.

Overnight, the patient had no complications. His IV heparin was
stopped and patient was switched to aspirin 81 mg a day, and
Brilinta 90 mg b.i.d. Neurologically he had returned to his baseline
normal function prior to procedure. He was then returned to the
admitting service.

The patient was instructed to refrain from stooping, bending or
lifting weights above 10 pounds for 2 weeks. He was advised against
driving. He was instructed to maintain adequate hydration and
continue taking his antiplatelets.
IMPRESSION: Status post endovascular thrombectomy of occluded left internal
carotid artery extra cranially with 1 pass with a 4 mm x 40 mm
Solitaire X retrieval device and aspiration, and 2 passes with a
mm x 45 mm Embotrap retrieval device and aspiration achieving
complete revascularization with extraction of clots.

Reocclusion of left internal carotid artery at the caval cavernous
junctions secondary to underlying severe stenosis due to
intracranial arteriosclerosis, associated with dissection of the
petrous segment.

PLAN:
Follow-up CT angiogram of the head and neck in 6 months time.

## 2022-01-18 SURGERY — IR WITH ANESTHESIA
Anesthesia: General

## 2022-01-18 MED ORDER — IOHEXOL 300 MG/ML  SOLN
100.0000 mL | Freq: Once | INTRAMUSCULAR | Status: AC | PRN
  Administered 2022-01-18: 75 mL via INTRA_ARTERIAL

## 2022-01-18 MED ORDER — PROPOFOL 10 MG/ML IV BOLUS
INTRAVENOUS | Status: DC | PRN
Start: 2022-01-18 — End: 2022-01-18
  Administered 2022-01-18: 150 mg via INTRAVENOUS
  Administered 2022-01-18: 50 mg via INTRAVENOUS

## 2022-01-18 MED ORDER — MUPIROCIN 2 % EX OINT
1.0000 "application " | TOPICAL_OINTMENT | Freq: Two times a day (BID) | CUTANEOUS | Status: DC
  Administered 2022-01-18 – 2022-01-19 (×3): 1 via NASAL
  Filled 2022-01-18 (×2): qty 22

## 2022-01-18 MED ORDER — SODIUM CHLORIDE 0.9 % IV SOLN: INTRAVENOUS | Status: DC | PRN

## 2022-01-18 MED ORDER — CLEVIDIPINE BUTYRATE 0.5 MG/ML IV EMUL
0.0000 mg/h | INTRAVENOUS | Status: DC
  Administered 2022-01-18 (×2): 2 mg/h via INTRAVENOUS
  Filled 2022-01-18: qty 50

## 2022-01-18 MED ORDER — MEPERIDINE HCL 25 MG/ML IJ SOLN: 6.2500 mg | INTRAMUSCULAR | Status: DC | PRN

## 2022-01-18 MED ORDER — DEXAMETHASONE SODIUM PHOSPHATE 10 MG/ML IJ SOLN
INTRAMUSCULAR | Status: DC | PRN
Start: 2022-01-18 — End: 2022-01-18
  Administered 2022-01-18: 5 mg via INTRAVENOUS

## 2022-01-18 MED ORDER — ONDANSETRON HCL 4 MG/2ML IJ SOLN: 4.0000 mg | Freq: Once | INTRAMUSCULAR | Status: DC | PRN

## 2022-01-18 MED ORDER — ALBUMIN HUMAN 5 % IV SOLN: INTRAVENOUS | Status: DC | PRN

## 2022-01-18 MED ORDER — IOHEXOL 300 MG/ML  SOLN
100.0000 mL | Freq: Once | INTRAMUSCULAR | Status: AC | PRN
  Administered 2022-01-18: 60 mL via INTRA_ARTERIAL

## 2022-01-18 MED ORDER — ACETAMINOPHEN 650 MG RE SUPP: 650.0000 mg | RECTAL | Status: DC | PRN

## 2022-01-18 MED ORDER — CEFAZOLIN SODIUM-DEXTROSE 2-3 GM-%(50ML) IV SOLR
INTRAVENOUS | Status: DC | PRN
  Administered 2022-01-18: 2 g via INTRAVENOUS

## 2022-01-18 MED ORDER — PHENYLEPHRINE HCL-NACL 20-0.9 MG/250ML-% IV SOLN
INTRAVENOUS | Status: DC | PRN
  Administered 2022-01-18: 25 ug/min via INTRAVENOUS

## 2022-01-18 MED ORDER — SUGAMMADEX SODIUM 200 MG/2ML IV SOLN
INTRAVENOUS | Status: DC | PRN
Start: 2022-01-18 — End: 2022-01-18
  Administered 2022-01-18: 200 mg via INTRAVENOUS

## 2022-01-18 MED ORDER — OXYCODONE HCL 5 MG/5ML PO SOLN: 5.0000 mg | Freq: Once | ORAL | Status: DC | PRN

## 2022-01-18 MED ORDER — CHLORHEXIDINE GLUCONATE 0.12 % MT SOLN: 15.0000 mL | Freq: Once | OROMUCOSAL | Status: AC

## 2022-01-18 MED ORDER — SODIUM CHLORIDE 0.9 % IV SOLN: INTRAVENOUS | Status: DC

## 2022-01-18 MED ORDER — NITROGLYCERIN 1 MG/10 ML FOR IR/CATH LAB
INTRA_ARTERIAL | Status: AC
  Filled 2022-01-18: qty 10

## 2022-01-18 MED ORDER — CLEVIDIPINE BUTYRATE 0.5 MG/ML IV EMUL
INTRAVENOUS | Status: AC
  Filled 2022-01-18: qty 50

## 2022-01-18 MED ORDER — ASPIRIN 81 MG PO CHEW: 81.0000 mg | CHEWABLE_TABLET | Freq: Every day | ORAL | Status: DC

## 2022-01-18 MED ORDER — FENTANYL CITRATE (PF) 250 MCG/5ML IJ SOLN
INTRAMUSCULAR | Status: DC | PRN
  Administered 2022-01-18 (×5): 50 ug via INTRAVENOUS

## 2022-01-18 MED ORDER — FENTANYL CITRATE (PF) 100 MCG/2ML IJ SOLN: 25.0000 ug | INTRAMUSCULAR | Status: DC | PRN

## 2022-01-18 MED ORDER — ACETAMINOPHEN 160 MG/5ML PO SOLN: 650.0000 mg | ORAL | Status: DC | PRN

## 2022-01-18 MED ORDER — CHLORHEXIDINE GLUCONATE 0.12 % MT SOLN
OROMUCOSAL | Status: AC
  Administered 2022-01-18: 15 mL via OROMUCOSAL
  Filled 2022-01-18: qty 15

## 2022-01-18 MED ORDER — LIDOCAINE HCL 1 % IJ SOLN
INTRAMUSCULAR | Status: AC
  Administered 2022-01-18: 6 mL via SUBCUTANEOUS
  Filled 2022-01-18: qty 20

## 2022-01-18 MED ORDER — OXYCODONE HCL 5 MG PO TABS: 5.0000 mg | ORAL_TABLET | Freq: Once | ORAL | Status: DC | PRN

## 2022-01-18 MED ORDER — PHENYLEPHRINE 40 MCG/ML (10ML) SYRINGE FOR IV PUSH (FOR BLOOD PRESSURE SUPPORT)
PREFILLED_SYRINGE | INTRAVENOUS | Status: DC | PRN
  Administered 2022-01-18: 80 ug via INTRAVENOUS
  Administered 2022-01-18: 240 ug via INTRAVENOUS
  Administered 2022-01-18 (×3): 80 ug via INTRAVENOUS

## 2022-01-18 MED ORDER — ACETAMINOPHEN 325 MG PO TABS
650.0000 mg | ORAL_TABLET | ORAL | Status: DC | PRN
  Administered 2022-01-19: 650 mg via ORAL
  Filled 2022-01-18: qty 2

## 2022-01-18 MED ORDER — LACTATED RINGERS IV SOLN: INTRAVENOUS | Status: DC

## 2022-01-18 MED ORDER — ACETAMINOPHEN 160 MG/5ML PO SOLN: 325.0000 mg | ORAL | Status: DC | PRN

## 2022-01-18 MED ORDER — EPHEDRINE SULFATE-NACL 50-0.9 MG/10ML-% IV SOSY
PREFILLED_SYRINGE | INTRAVENOUS | Status: DC | PRN
  Administered 2022-01-18: 5 mg via INTRAVENOUS
  Administered 2022-01-18 (×2): 10 mg via INTRAVENOUS

## 2022-01-18 MED ORDER — TICAGRELOR 90 MG PO TABS
90.0000 mg | ORAL_TABLET | Freq: Two times a day (BID) | ORAL | Status: DC
  Administered 2022-01-18 – 2022-01-20 (×4): 90 mg via ORAL
  Filled 2022-01-18 (×4): qty 1

## 2022-01-18 MED ORDER — HEPARIN SODIUM (PORCINE) 1000 UNIT/ML IJ SOLN
INTRAMUSCULAR | Status: DC | PRN
  Administered 2022-01-18: 1000 [IU] via INTRAVENOUS
  Administered 2022-01-18: 3000 [IU] via INTRAVENOUS

## 2022-01-18 MED ORDER — ROCURONIUM BROMIDE 10 MG/ML (PF) SYRINGE
PREFILLED_SYRINGE | INTRAVENOUS | Status: DC | PRN
  Administered 2022-01-18: 40 mg via INTRAVENOUS
  Administered 2022-01-18 (×4): 20 mg via INTRAVENOUS

## 2022-01-18 MED ORDER — ORAL CARE MOUTH RINSE: 15.0000 mL | Freq: Once | OROMUCOSAL | Status: AC

## 2022-01-18 MED ORDER — ASPIRIN EC 81 MG PO TBEC
81.0000 mg | DELAYED_RELEASE_TABLET | Freq: Every day | ORAL | Status: DC
  Administered 2022-01-18 – 2022-01-20 (×3): 81 mg via ORAL
  Filled 2022-01-18 (×3): qty 1

## 2022-01-18 MED ORDER — ACETAMINOPHEN 325 MG PO TABS: 325.0000 mg | ORAL_TABLET | ORAL | Status: DC | PRN

## 2022-01-18 MED ORDER — TICAGRELOR 90 MG PO TABS: 90.0000 mg | ORAL_TABLET | Freq: Two times a day (BID) | ORAL | Status: DC

## 2022-01-18 MED ORDER — CHLORHEXIDINE GLUCONATE 0.12 % MT SOLN
OROMUCOSAL | Status: AC
  Filled 2022-01-18: qty 15

## 2022-01-18 MED ORDER — LIDOCAINE 2% (20 MG/ML) 5 ML SYRINGE
INTRAMUSCULAR | Status: DC | PRN
Start: 2022-01-18 — End: 2022-01-18
  Administered 2022-01-18: 80 mg via INTRAVENOUS

## 2022-01-18 MED ORDER — CEFAZOLIN SODIUM-DEXTROSE 2-4 GM/100ML-% IV SOLN
INTRAVENOUS | Status: AC
  Filled 2022-01-18: qty 100

## 2022-01-18 MED ORDER — FENTANYL CITRATE (PF) 250 MCG/5ML IJ SOLN
INTRAMUSCULAR | Status: AC
  Filled 2022-01-18: qty 5

## 2022-01-18 MED ORDER — ONDANSETRON HCL 4 MG/2ML IJ SOLN
INTRAMUSCULAR | Status: DC | PRN
  Administered 2022-01-18: 4 mg via INTRAVENOUS

## 2022-01-18 MED ORDER — NITROGLYCERIN 1 MG/10 ML FOR IR/CATH LAB
INTRA_ARTERIAL | Status: AC | PRN
  Administered 2022-01-18 (×2): 25 ug via INTRA_ARTERIAL

## 2022-01-18 NOTE — Anesthesia Procedure Notes (Signed)
Arterial Line Insertion Start/End1/27/2023 12:15 PM, 01/18/2022 12:18 PM Performed by: Colin Benton, CRNA, CRNA  Patient location: Pre-op. Preanesthetic checklist: patient identified, IV checked, site marked, risks and benefits discussed, surgical consent, monitors and equipment checked, pre-op evaluation, timeout performed and anesthesia consent Lidocaine 1% used for infiltration Left, radial was placed Catheter size: 20 G Hand hygiene performed , maximum sterile barriers used  and Seldinger technique used Allen's test indicative of satisfactory collateral circulation Attempts: 1 Procedure performed without using ultrasound guided technique. Following insertion, dressing applied and Biopatch. Post procedure assessment: normal and unchanged  Patient tolerated the procedure well with no immediate complications.

## 2022-01-18 NOTE — Progress Notes (Signed)
Chief Complaint: Patient was seen in consultation today for critical (L)ICA stenosis   Referring Physician(s): Dr. Erlinda Hong  Supervising Physician: Luanne Bras  Patient Status: [facility] - In-pt  History of Present Illness: CHIP CANEPA is a 71 y.o. 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 We discussed with him yesterday the plan for (L)ICA stent angioplasty and he was agreeable. We switched him to Brilinta last night. Pt feels well this am. PMHx, meds, labs, imaging, allergies reviewed. Feels well, no recent fevers, chills, illness. Has been NPO today as directed. Family at bedside.   History reviewed. No pertinent past medical history.  Past Surgical History:  Procedure Laterality Date   APPENDECTOMY     IR ANGIO INTRA EXTRACRAN SEL COM CAROTID INNOMINATE BILAT MOD SED  01/16/2022   IR ANGIO VERTEBRAL SEL SUBCLAVIAN INNOMINATE BILAT MOD SED  01/16/2022   IR EMBO ART  VEN HEMORR LYMPH EXTRAV  INC GUIDE ROADMAPPING  08/30/2021   IR FLUORO GUIDE CV LINE RIGHT  08/30/2021   IR FLUORO GUIDE CV LINE RIGHT  09/18/2021   IR RENAL SELECTIVE  UNI INC S&I MOD SED  08/31/2021   IR US GUIDE VASC ACCESS RIGHT  08/30/2021   IR US GUIDE VASC ACCESS RIGHT  08/30/2021   IR US GUIDE VASC ACCESS RIGHT  09/18/2021   IR US GUIDE VASC ACCESS RIGHT  01/16/2022    Allergies: Patient has no known allergies.  Medications:  Current Facility-Administered Medications:     stroke: mapping our early stages of recovery book, , Does not apply, Once, Doutova, Anastassia, MD   acetaminophen (TYLENOL) tablet 650 mg, 650 mg, Oral, Q4H PRN **OR** acetaminophen (TYLENOL) 160 MG/5ML solution 650 mg, 650 mg, Per Tube, Q4H PRN **OR** acetaminophen (TYLENOL) suppository 650 mg, 650 mg, Rectal, Q4H PRN, Doutova, Anastassia, MD   ALPRAZolam Duanne Moron) tablet 0.5 mg, 0.5 mg, Oral, QHS PRN, Doutova, Anastassia, MD, 0.5 mg at 01/14/22  2154   ALPRAZolam (XANAX) tablet 0.5 mg, 0.5 mg, Oral, Daily PRN, Roel Cluck, Anastassia, MD   aspirin EC tablet 81 mg, 81 mg, Oral, Daily, [place] Eric J, DO   atorvastatin (LIPITOR) tablet 40 mg, 40 mg, Oral, QHS, Rosalin Hawking, MD, 40 mg at 01/17/22 2126   calcitRIOL (ROCALTROL) capsule 0.25 mcg, 0.25 mcg, Oral, Q M,W,F-HD, Roney Jaffe, MD   Chlorhexidine Gluconate Cloth 2 % PADS 6 each, 6 each, Topical, Q0600, Roney Jaffe, MD, 6 each at 22/48/25 0037   folic acid (FOLVITE) tablet 2 mg, 2 mg, Oral, q AM, Doutova, Anastassia, MD, 2 mg at 01/17/22 0841   HYDROmorphone (DILAUDID) injection 1 mg, 1 mg, Intravenous, Q4H PRN, Doutova, Anastassia, MD, 1 mg at 01/18/22 0740   iohexol (OMNIPAQUE) 300 MG/ML solution 100 mL, 100 mL, Intra-arterial, Once PRN, Deveshwar, Sanjeev, MD   midodrine (PROAMATINE) tablet 5 mg, 5 mg, Oral, TID WC, Doutova, Anastassia, MD, 5 mg at 01/17/22 1708   mirtazapine (REMERON) tablet 30 mg, 30 mg, Oral, QHS, Doutova, Anastassia, MD, 30 mg at 01/17/22 2126   mupirocin ointment (BACTROBAN) 2 % 1 application, 1 application, Nasal, BID, [place] Eric J, DO   oxyCODONE (Oxy IR/ROXICODONE) immediate release tablet 5 mg, 5 mg, Oral, Q6H PRN, Doutova, Anastassia, MD, 5 mg at 01/17/22 0841   pantoprazole (PROTONIX) EC tablet 40 mg, 40 mg, Oral, BID AC, Doutova, Anastassia, MD, 40 mg at 01/17/22 1708   sevelamer carbonate (RENVELA) tablet  800 mg, 800 mg, Oral, TID WC, Doutova, Anastassia, MD, 800 mg at 01/17/22 1709   tamsulosin (FLOMAX) capsule 0.4 mg, 0.4 mg, Oral, q AM, Doutova, Anastassia, MD, 0.4 mg at 01/17/22 0841   ticagrelor (BRILINTA) tablet 90 mg, 90 mg, Oral, BID, Julio Storr, PA-C  Facility-Administered Medications Ordered in Other Encounters:    heparin lock flush 100 unit/mL, 500 Units, Intracatheter, Once, Truitt Merle, MD   sodium chloride flush (NS) 0.9 % injection 10 mL, 10 mL, Intracatheter, Once, Truitt Merle, MD    History reviewed. No pertinent family  history.  Social History   Socioeconomic History   Marital status: Married    Spouse name: Not on file   Number of children: Not on file   Years of education: Not on file   Highest education level: Not on file  Occupational History   Not on file  Tobacco Use   Smoking status: Some Days    Packs/day: 0.25    Years: 25.00    Pack years: 6.25    Types: Cigarettes   Smokeless tobacco: Never  Substance and Sexual Activity   Alcohol use: Not on file   Drug use: Not on file   Sexual activity: Not on file  Other Topics Concern   Not on file  Social History Narrative   Not on file   Social Determinants of Health   Financial Resource Strain: Low Risk    Difficulty of Paying Living Expenses: Not very hard  Food Insecurity: No Food Insecurity   Worried About Running Out of Food in the Last Year: Never true   [place] in the Last Year: Never true  Transportation Needs: No Transportation Needs   Lack of Transportation (Medical): No   Lack of Transportation (Non-Medical): No  Physical Activity: Not on file  Stress: Not on file  Social Connections: Not on file    Review of Systems: A 12 point ROS discussed and pertinent positives are indicated in the HPI above.  All other systems are negative.  Review of Systems  Vital Signs: BP 128/78 (BP Location: Right Arm)    Pulse 80    Temp 98 F (36.7 C) (Oral)    Resp 16    Wt 74 kg    SpO2 97%    BMI 23.41 kg/m   Physical Exam Constitutional:      Appearance: Normal appearance. He is not ill-appearing.  HENT:     Mouth/Throat:     Mouth: Mucous membranes are moist.     Pharynx: Oropharynx is clear.  Cardiovascular:     Rate and Rhythm: Normal rate and regular rhythm.     Pulses: Normal pulses.     Heart sounds: Normal heart sounds.  Pulmonary:     Effort: Pulmonary effort is normal. No respiratory distress.     Breath sounds: Normal breath sounds.  Skin:    General: Skin is warm and dry.  Neurological:     Mental  Status: He is alert and oriented to person, place, and time. Mental status is at baseline.  Psychiatric:        Mood and Affect: Mood normal.        Thought Content: Thought content normal.     Imaging: CT Angio Head W/Cm &/Or Wo Cm  Result Date: 01/14/2022 CLINICAL DATA:  Right hand weakness, right-sided facial droop and slurred speech EXAM: CT ANGIOGRAPHY HEAD AND NECK TECHNIQUE: Multidetector CT imaging of the head and neck was performed using the  standard protocol during bolus administration of intravenous contrast. Multiplanar CT image reconstructions and MIPs were obtained to evaluate the vascular anatomy. Carotid stenosis measurements (when applicable) are obtained utilizing NASCET criteria, using the distal internal carotid diameter as the denominator. RADIATION DOSE REDUCTION: This exam was performed according to the departmental dose-optimization program which includes automated exposure control, adjustment of the mA and/or kV according to patient size and/or use of iterative reconstruction technique. CONTRAST:  176mL OMNIPAQUE IOHEXOL 350 MG/ML SOLN COMPARISON:  01/14/2022 CT head and MRI FINDINGS: CT HEAD FINDINGS Brain: No evidence of acute hemorrhage, cerebral edema, mass, mass effect, or midline shift. No hydrocephalus or extra-axial fluid collection. Periventricular white matter changes, likely the sequela of chronic small vessel ischemic disease. The acute infarcts seen on the same-day MRI are not significantly apparent on this CT. Vascular: No hyperdense vessel. Skull: Normal. Negative for fracture or focal lesion. Sinuses/Orbits: No acute finding. Other: The mastoid air cells are well aerated. CTA NECK FINDINGS Aortic arch: Standard branching. Imaged portion shows no evidence of aneurysm or dissection. No significant stenosis of the major arch vessel origins. Right carotid system: No evidence of dissection, stenosis (50% or greater) or occlusion. Left carotid system: Evaluation of the  origin of the left common carotid artery is somewhat limited due to beam hardening artifact from the adjacent venous bolus, however there appears to be mild narrowing at the origin. 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 No distal reconstitution of the extracranial ICA. Vertebral arteries: Moderate narrowing at the origin of the right vertebral artery, secondary to noncalcified plaque. Focal moderate stenosis of the right V2 (series 12, image 248) at the level of C6, as well as in the right C5-C6 foramen, secondary to degenerative changes (series 12, image 242). The right vertebral artery is otherwise patent. The left vertebral artery demonstrates mild calcified narrowing in the V2 segment, but is otherwise patent. Codominant. Skeleton: Multilevel degenerative changes in the cervical spine, with trace retrolisthesis of C5 on C6. No acute osseous abnormality. Other neck: Negative. Upper chest: Dependent atelectasis. Review of the MIP images confirms the above findings CTA HEAD FINDINGS Evaluation is somewhat limited by degree of venous contamination. Anterior circulation: The left intracranial internal carotid artery is non-opacified until the ophthalmic and supraclinoid portions (series 12, image 111). Calcifications in the right intracranial internal carotid artery, which cause moderate narrowing in the distal cavernous segment and proximal supraclinoid segment. Occlusion of the left A1 (series 12, image 100), with a small area of filling in the distal left A1, likely retrograde. Patent right A1. Normal anterior communicating artery. Anterior cerebral arteries are patent to their distal aspects. No M1 stenosis or occlusion. Normal MCA bifurcations. Distal MCA branches perfused and symmetric. Posterior circulation: Vertebral arteries patent to the vertebrobasilar junction, with mild calcifications and irregularity in the right V4 segment,  prior to the PICA takeoff (series 12, image 145), which causes mild-to-moderate narrowing. Mild to moderate narrowing in the left V4, proximal to the left PICA takeoff (series 12, image 144). Posterior inferior cerebral arteries patent bilaterally. Basilar patent to its distal aspect. Superior cerebellar arteries patent bilaterally. Bilateral P1 segments originate from the basilar artery. PCAs perfused to their distal aspects, with mild narrowing in the left P1/P2 and poor opacification of the distal left P2 and P3 segments. The bilateral posterior communicating arteries are not definitively seen. Venous sinuses: As permitted by contrast timing, patent. Anatomic variants: None significant Review of the  MIP images confirms the above findings IMPRESSION: 1. 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 The left intracranial internal carotid artery reconstitutes in the ophthalmic and supraclinoid portions. 2. Moderate narrowing in the distal right cavernous ICA and proximal supraclinoid segment. 3. Occlusion of the left A1.  The left ACA is otherwise patent. 4. Moderate stenosis at the origin of the right vertebral artery, with additional focal moderate stenosis of the right V2 at the level of C6, both secondary to atherosclerotic plaque and degenerative changes. Mild-to-moderate stenosis of the bilateral V4 segments. 5. Poor opacification of the distal left P2 and P3 segments. These results were called by telephone at the time of interpretation on 01/14/2022 at 8:48 pm to provider DR. DOUTOVA , who verbally acknowledged these results. Electronically Signed   By: Merilyn Baba M.D.   On: 01/14/2022 20:48   DG Chest 2 View  Result Date: 01/14/2022 CLINICAL DATA:  Facial droop EXAM: CHEST - 2 VIEW COMPARISON:  09/06/2021 FINDINGS: Right dialysis catheter remains in place, unchanged. Heart and mediastinal contours are within normal limits. No focal opacities or effusions. No  acute bony abnormality. Old healed left rib fractures. IMPRESSION: No active cardiopulmonary disease. Electronically Signed   By: Rolm Baptise M.D.   On: 01/14/2022 19:34   CT HEAD WO CONTRAST  Result Date: 01/14/2022 CLINICAL DATA:  Facial droop and right arm weakness and numbness for the past 2 days. EXAM: CT HEAD WITHOUT CONTRAST TECHNIQUE: Contiguous axial images were obtained from the base of the skull through the vertex without intravenous contrast. RADIATION DOSE REDUCTION: This exam was performed according to the departmental dose-optimization program which includes automated exposure control, adjustment of the mA and/or kV according to patient size and/or use of iterative reconstruction technique. COMPARISON:  None. FINDINGS: Brain: Normal size and position of the ventricles. Minimal patchy white matter low density in both cerebral hemispheres. No intracranial hemorrhage, mass lesion or CT evidence of acute infarction. Vascular: No hyperdense vessel or unexpected calcification. Skull: Normal. Negative for fracture or focal lesion. Sinuses/Orbits: Unremarkable. Other: None. IMPRESSION: 1. No acute abnormality. 2. Minimal chronic small vessel white matter ischemic changes in both cerebral hemispheres. These make it difficult to exclude a small acute subcortical infarct. If this is a continued clinical concern, this could be further evaluated with an MRI of the brain. Electronically Signed   By: Claudie Revering M.D.   On: 01/14/2022 14:17   CT Angio Neck W and/or Wo Contrast  Result Date: 01/14/2022 CLINICAL DATA:  Right hand weakness, right-sided facial droop and slurred speech EXAM: CT ANGIOGRAPHY HEAD AND NECK TECHNIQUE: Multidetector CT imaging of the head and neck was performed using the standard protocol during bolus administration of intravenous contrast. Multiplanar CT image reconstructions and MIPs were obtained to evaluate the vascular anatomy. Carotid stenosis measurements (when applicable) are  obtained utilizing NASCET criteria, using the distal internal carotid diameter as the denominator. RADIATION DOSE REDUCTION: This exam was performed according to the departmental dose-optimization program which includes automated exposure control, adjustment of the mA and/or kV according to patient size and/or use of iterative reconstruction technique. CONTRAST:  17mL OMNIPAQUE IOHEXOL 350 MG/ML SOLN COMPARISON:  01/14/2022 CT head and MRI FINDINGS: CT HEAD FINDINGS Brain: No evidence of acute hemorrhage, cerebral edema, mass, mass effect, or midline shift. No hydrocephalus or extra-axial fluid collection. Periventricular white matter changes, likely the sequela of chronic small vessel ischemic disease. The acute infarcts seen on the same-day MRI are  not significantly apparent on this CT. Vascular: No hyperdense vessel. Skull: Normal. Negative for fracture or focal lesion. Sinuses/Orbits: No acute finding. Other: The mastoid air cells are well aerated. CTA NECK FINDINGS Aortic arch: Standard branching. Imaged portion shows no evidence of aneurysm or dissection. No significant stenosis of the major arch vessel origins. Right carotid system: No evidence of dissection, stenosis (50% or greater) or occlusion. Left carotid system: Evaluation of the origin of the left common carotid artery is somewhat limited due to beam hardening artifact from the adjacent venous bolus, however there appears to be mild narrowing at the origin. 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 No distal reconstitution of the extracranial ICA. Vertebral arteries: Moderate narrowing at the origin of the right vertebral artery, secondary to noncalcified plaque. Focal moderate stenosis of the right V2 (series 12, image 248) at the level of C6, as well as in the right C5-C6 foramen, secondary to degenerative changes (series 12, image 242). The right vertebral artery is otherwise  patent. The left vertebral artery demonstrates mild calcified narrowing in the V2 segment, but is otherwise patent. Codominant. Skeleton: Multilevel degenerative changes in the cervical spine, with trace retrolisthesis of C5 on C6. No acute osseous abnormality. Other neck: Negative. Upper chest: Dependent atelectasis. Review of the MIP images confirms the above findings CTA HEAD FINDINGS Evaluation is somewhat limited by degree of venous contamination. Anterior circulation: The left intracranial internal carotid artery is non-opacified until the ophthalmic and supraclinoid portions (series 12, image 111). Calcifications in the right intracranial internal carotid artery, which cause moderate narrowing in the distal cavernous segment and proximal supraclinoid segment. Occlusion of the left A1 (series 12, image 100), with a small area of filling in the distal left A1, likely retrograde. Patent right A1. Normal anterior communicating artery. Anterior cerebral arteries are patent to their distal aspects. No M1 stenosis or occlusion. Normal MCA bifurcations. Distal MCA branches perfused and symmetric. Posterior circulation: Vertebral arteries patent to the vertebrobasilar junction, with mild calcifications and irregularity in the right V4 segment, prior to the PICA takeoff (series 12, image 145), which causes mild-to-moderate narrowing. Mild to moderate narrowing in the left V4, proximal to the left PICA takeoff (series 12, image 144). Posterior inferior cerebral arteries patent bilaterally. Basilar patent to its distal aspect. Superior cerebellar arteries patent bilaterally. Bilateral P1 segments originate from the basilar artery. PCAs perfused to their distal aspects, with mild narrowing in the left P1/P2 and poor opacification of the distal left P2 and P3 segments. The bilateral posterior communicating arteries are not definitively seen. Venous sinuses: As permitted by contrast timing, patent. Anatomic variants: None  significant Review of the MIP images confirms the above findings IMPRESSION: 1. 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 The left intracranial internal carotid artery reconstitutes in the ophthalmic and supraclinoid portions. 2. Moderate narrowing in the distal right cavernous ICA and proximal supraclinoid segment. 3. Occlusion of the left A1.  The left ACA is otherwise patent. 4. Moderate stenosis at the origin of the right vertebral artery, with additional focal moderate stenosis of the right V2 at the level of C6, both secondary to atherosclerotic plaque and degenerative changes. Mild-to-moderate stenosis of the bilateral V4 segments. 5. Poor opacification of the distal left P2 and P3 segments. These results were called by telephone at the time of interpretation on 01/14/2022 at 8:48 pm to provider DR. DOUTOVA , who verbally acknowledged  these results. Electronically Signed   By: Merilyn Baba M.D.   On: 01/14/2022 20:48   MR BRAIN WO CONTRAST  Result Date: 01/14/2022 CLINICAL DATA:  Neuro deficit, acute, stroke suspected. EXAM: MRI HEAD WITHOUT CONTRAST TECHNIQUE: Multiplanar, multiecho pulse sequences of the brain and surrounding structures were obtained without intravenous contrast. COMPARISON:  Head CT same day FINDINGS: Brain: Diffusion imaging shows multiple small acute infarctions within the left hemisphere scattered within the MCA territory which could either be due to micro embolic infarctions or watershed infarctions. Micro embolic infarctions are favored. No large confluent infarction. No mass effect or swelling. Elsewhere, the brainstem and cerebellum are normal. Cerebral hemispheres show mild to moderate chronic small-vessel ischemic changes of the white matter. No mass lesion, hemorrhage, hydrocephalus or extra-axial collection. Vascular: Major vessels at the base of the brain show flow. Skull and upper cervical spine: Negative Sinuses/Orbits:  Clear/normal Other: None IMPRESSION: Numerous punctate infarctions scattered within the left MCA territory including the cortex, subcortical white matter and basal ganglia. This pattern could be due to micro embolic infarction or watershed infarction. No large confluent infarction. No mass effect or hemorrhage. Moderate chronic small-vessel ischemic changes elsewhere affecting the cerebral hemispheric white matter. Electronically Signed   By: Nelson Chimes M.D.   On: 01/14/2022 17:58   IR US Guide Vasc Access Right  Result Date: 01/17/2022 CLINICAL DATA:  History of transient ischemic attack comprising of right hand weakness with slurred speech evaluation of the brain revealed numerous diffusion-weighted abnormalities in the left middle cerebral artery distribution. CT of the head and neck revealed left internal carotid artery proximal occlusion and questionable string sign. EXAM: BILATERAL COMMON CAROTID AND INNOMINATE ANGIOGRAPHY COMPARISON:  CT angiogram of the head and neck of January 14, 2022. MEDICATIONS: Heparin 2000 units IV. No antibiotic was administered within 1 hour of the procedure. ANESTHESIA/SEDATION: Versed 1 mg IV; Fentanyl 25 mcg IV Moderate Sedation Time:  35 minutes The patient was continuously monitored during the procedure by the interventional radiology nurse under my direct supervision. CONTRAST:  Omnipaque 300 approximately 65 mL. FLUOROSCOPY TIME:  Fluoroscopy Time: 13 minutes 6 seconds (1170 mGy). COMPLICATIONS: None immediate. TECHNIQUE: Informed written consent was obtained from the patient after a thorough discussion of the procedural risks, benefits and alternatives. All questions were addressed. Maximal Sterile Barrier Technique was utilized including caps, mask, sterile gowns, sterile gloves, sterile drape, hand hygiene and skin antiseptic. A timeout was performed prior to the initiation of the procedure. The right forearm to the wrist was prepped and draped in usual sterile  manner. The right radial artery was then identified with ultrasound, and its morphology documented permanently in the radiology PACS system. A dorsal palmar anastomosis was verified to be present. Using ultrasound guidance and micropuncture set access into the right radial artery was obtained with a 4/5 French radial sheath over a 0.018 inch micro guidewire. The guidewire and obturator were removed. Good aspiration was obtained from the side port of the radial sheath. A cocktail of 2000 units of heparin, 2.5 mg of verapamil, and 200 mcg of nitroglycerin was then infused in diluted form through sheath without event. A right radial arteriogram was performed. Over a 0.035 inch Roadrunner guidewire, a 5 [place] Simmons 2 diagnostic catheter was then advanced to the aortic arch region, selectively positioned in the origin of the right vertebral artery, the right common carotid artery, the left common carotid artery and the left vertebral artery. A wrist band was then applied for hemostasis at the  right radial puncture site. Distal right radial pulse was verified to be present. FINDINGS: The right vertebral artery origin demonstrates a severe stenosis. More distally, the vessel is seen to opacify to the cranial skull base. There is approximately 50% stenosis of the right vertebrobasilar junction just proximal to the origin of the right posterior-inferior cerebellar artery. Distal to this contrast is seen in the basilar artery and the posterior cerebral arteries. Mixed with unopacified blood is seen in the basilar artery from the contralateral vertebral artery. The right common carotid arteriogram demonstrates the right external carotid artery and its major branches to be widely patent. The right internal carotid artery has an eccentric smooth atherosclerotic plaque along the lateral aspect just distal to the bulb with approximately 20% stenosis by the NASCET criteria. More distally, the vessel is seen to opacify to the  cranial skull base. Mild caliber irregularity probably related to arteriosclerosis is seen of the cavernous segment of the right internal carotid artery. The right middle cerebral artery and the right anterior cerebral artery opacify into the capillary and venous phases. Prompt opacification of the left anterior cerebral A2 segment and distally is seen via anterior communicating artery. A small infundibulum is evident in the right posterior communicating artery. The left common carotid arteriogram demonstrates the left external carotid artery and its major branches to be patent. Left internal carotid artery at the bulb and its proximal 1/3 demonstrates slow ascent of contrast to near complete occlusion. The delayed images demonstrated ascent of contrast to the distal cervical left ICA. More distally, reconstitution of the left internal carotid artery and the supraclinoid segment is noted from left external carotid artery branches via the ipsilateral ophthalmic artery. Opacification of the right left middle cerebral artery distribution is evident. No opacification of the left anterior cerebral artery is evident. The left vertebral artery origin is widely patent. The vessel is seen to opacify to the cranial skull base. Patency is seen of the left vertebrobasilar junction with a 50%-60% stenosis of the left vertebrobasilar junction just proximal to the origin of the left posterior-inferior cerebellar artery. Mild caliber irregularity of the proximal PICA suggests intracranial arteriosclerosis. More distally, opacification is seen of the basilar artery, the posterior cerebral arteries, the superior cerebellar arteries and the anterior-inferior cerebellar arteries into the capillary and venous phases. Unopacified blood is seen in the basilar artery from the contralateral vertebral artery. 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 Partial reconstitution of the left internal carotid artery supraclinoid segment from the ipsilateral ophthalmic artery. Non opacification of the left anterior cerebral artery A1 segment. 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 Approximately 50% to 60% stenosis of the dominant left vertebrobasilar junction just proximal to the left posterior-inferior cerebellar artery. Approximately 50% stenosis of the right vertebrobasilar junction proximal to the right posteroinferior cerebellar artery. PLAN: Finding discussed with the patient and the referring neurologist. Electronically Signed   By: Luanne Bras M.D.   On: 01/17/2022 08:16   DG Chest Port 1 View  Result Date: 01/15/2022 CLINICAL DATA:  Weakness. EXAM: PORTABLE CHEST 1 VIEW COMPARISON:  09/06/2021 FINDINGS: Dialysis catheter with tip at the upper cavoatrial junction. There is no edema, consolidation, effusion, or pneumothorax. Artifact from EKG leads. Normal heart size and mediastinal contours. IMPRESSION: No evidence of active disease. Electronically Signed   By: Jorje Guild M.D.   On: 01/15/2022  07:43   ECHOCARDIOGRAM COMPLETE  Result Date: 01/16/2022    ECHOCARDIOGRAM REPORT   Patient Name:   CATLIN AYCOCK Date of Exam: 01/16/2022 Medical Rec #:  [id number]    Height:       70.0 in Accession #:    [id number]   Weight:       163.1 lb Date of Birth:  1951-05-28     BSA:          1.914 m Patient Age:    68 years     BP:           128/79 mmHg Patient Gender: M            HR:           83 bpm. Exam Location:  Inpatient Procedure: 2D Echo Indications:    Stroke  History:        Patient has prior history of Echocardiogram examinations, most                 recent 09/08/2021.  Sonographer:    [name] Referring Phys: [place]  1. Left ventricular ejection fraction, by estimation, is 55 to 60%. The left ventricle has normal function. The left ventricle has no regional  wall motion abnormalities. There is mild left ventricular hypertrophy. Left ventricular diastolic parameters are consistent with Grade I diastolic dysfunction (impaired relaxation).  2. Right ventricular systolic function is normal. The right ventricular size is normal. Tricuspid regurgitation signal is inadequate for assessing PA pressure.  3. The mitral valve is normal in structure. Trivial mitral valve regurgitation. No evidence of mitral stenosis.  4. The aortic valve is tricuspid. Aortic valve regurgitation is not visualized. Aortic valve sclerosis/calcification is present, without any evidence of aortic stenosis.  5. Aortic dilatation noted. There is mild dilatation of the ascending aorta, measuring 38 mm.  6. The inferior vena cava is normal in size with greater than 50% respiratory variability, suggesting right atrial pressure of 3 mmHg.  7. A small pericardial effusion is present. FINDINGS  Left Ventricle: Left ventricular ejection fraction, by estimation, is 55 to 60%. The left ventricle has normal function. The left ventricle has no regional wall motion abnormalities. The left ventricular internal cavity size was normal in size. There is  mild left ventricular hypertrophy. Left ventricular diastolic parameters are consistent with Grade I diastolic dysfunction (impaired relaxation). Right Ventricle: The right ventricular size is normal. No increase in right ventricular wall thickness. Right ventricular systolic function is normal. Tricuspid regurgitation signal is inadequate for assessing PA pressure. Left Atrium: Left atrial size was normal in size. Right Atrium: Right atrial size was normal in size. Pericardium: A small pericardial effusion is present. Mitral Valve: The mitral valve is normal in structure. There is mild calcification of the mitral valve leaflet(s). Mild mitral annular calcification. Trivial mitral valve regurgitation. No evidence of mitral valve stenosis. Tricuspid Valve: The tricuspid  valve is normal in structure. Tricuspid valve regurgitation is not demonstrated. Aortic Valve: The aortic valve is tricuspid. Aortic valve regurgitation is not visualized. Aortic valve sclerosis/calcification is present, without any evidence of aortic stenosis. Aortic valve mean gradient measures 5.0 mmHg. Aortic valve peak gradient measures 9.0 mmHg. Aortic valve area, by VTI measures 1.94 cm. Pulmonic Valve: The pulmonic valve was normal in structure. Pulmonic valve regurgitation is not visualized. Aorta: The aortic root is normal in size and structure and aortic dilatation noted. There is mild dilatation of the ascending aorta, measuring 38 mm.  Venous: The inferior vena cava is normal in size with greater than 50% respiratory variability, suggesting right atrial pressure of 3 mmHg. IAS/Shunts: No atrial level shunt detected by color flow Doppler.  LEFT VENTRICLE PLAX 2D LVIDd:         5.00 cm   Diastology LVIDs:         3.80 cm   LV e' medial:    5.77 cm/s LV PW:         1.20 cm   LV E/e' medial:  8.4 LV IVS:        1.20 cm   LV e' lateral:   12.10 cm/s LVOT diam:     2.00 cm   LV E/e' lateral: 4.0 LV SV:         42 LV SV Index:   22 LVOT Area:     3.14 cm  RIGHT VENTRICLE RV Basal diam:  3.30 cm RV Mid diam:    2.50 cm RV S prime:     13.60 cm/s TAPSE (M-mode): 1.7 cm LEFT ATRIUM             Index        RIGHT ATRIUM           Index LA diam:        3.30 cm 1.72 cm/m   RA Area:     13.50 cm LA Vol (A2C):   42.2 ml 22.05 ml/m  RA Volume:   31.50 ml  16.46 ml/m LA Vol (A4C):   49.4 ml 25.81 ml/m LA Biplane Vol: 46.0 ml 24.03 ml/m  AORTIC VALVE AV Area (Vmax):    2.08 cm AV Area (Vmean):   1.97 cm AV Area (VTI):     1.94 cm AV Vmax:           150.00 cm/s AV Vmean:          99.300 cm/s AV VTI:            0.219 m AV Peak Grad:      9.0 mmHg AV Mean Grad:      5.0 mmHg LVOT Vmax:         99.25 cm/s LVOT Vmean:        62.150 cm/s LVOT VTI:          0.135 m LVOT/AV VTI ratio: 0.62  AORTA Ao Root diam: 3.40 cm  Ao Asc diam:  3.80 cm MITRAL VALVE MV Area (PHT): 4.57 cm    SHUNTS MV Decel Time: 166 msec    Systemic VTI:  0.14 m MV E velocity: 48.40 cm/s  Systemic Diam: 2.00 cm MV A velocity: 67.70 cm/s MV E/A ratio:  0.71 Dalton McleanMD Electronically signed by Franki Monte Signature Date/Time: 01/16/2022/5:45:45 PM    Final    DG HIP UNILAT WITH PELVIS 2-3 VIEWS LEFT  Result Date: 01/16/2022 CLINICAL DATA:  Left hip pain.  No injury. EXAM: DG HIP (WITH OR WITHOUT PELVIS) 2-3V LEFT COMPARISON:  Hip x-ray 01/14/2022. FINDINGS: There is no evidence of hip fracture or dislocation. There is no evidence of arthropathy or other focal bone abnormality. IMPRESSION: Negative. Electronically Signed   By: Ronney Asters M.D.   On: 01/16/2022 19:24   DG HIPS BILAT WITH PELVIS MIN 5 VIEWS  Result Date: 01/14/2022 CLINICAL DATA:  Bilateral hip pain EXAM: DG HIP (WITH OR WITHOUT PELVIS) 5+V BILAT COMPARISON:  None. FINDINGS: There is no evidence of hip fracture or dislocation. There is no evidence of arthropathy or other  focal bone abnormality. IMPRESSION: Negative. Electronically Signed   By: Fidela Salisbury M.D.   On: 01/14/2022 21:57   IR ANGIO INTRA EXTRACRAN SEL COM CAROTID INNOMINATE BILAT MOD SED  Result Date: 01/17/2022 CLINICAL DATA:  History of transient ischemic attack comprising of right hand weakness with slurred speech evaluation of the brain revealed numerous diffusion-weighted abnormalities in the left middle cerebral artery distribution. CT of the head and neck revealed left internal carotid artery proximal occlusion and questionable string sign. EXAM: BILATERAL COMMON CAROTID AND INNOMINATE ANGIOGRAPHY COMPARISON:  CT angiogram of the head and neck of January 14, 2022. MEDICATIONS: Heparin 2000 units IV. No antibiotic was administered within 1 hour of the procedure. ANESTHESIA/SEDATION: Versed 1 mg IV; Fentanyl 25 mcg IV Moderate Sedation Time:  35 minutes The patient was continuously monitored during the  procedure by the interventional radiology nurse under my direct supervision. CONTRAST:  Omnipaque 300 approximately 65 mL. FLUOROSCOPY TIME:  Fluoroscopy Time: 13 minutes 6 seconds (1170 mGy). COMPLICATIONS: None immediate. TECHNIQUE: Informed written consent was obtained from the patient after a thorough discussion of the procedural risks, benefits and alternatives. All questions were addressed. Maximal Sterile Barrier Technique was utilized including caps, mask, sterile gowns, sterile gloves, sterile drape, hand hygiene and skin antiseptic. A timeout was performed prior to the initiation of the procedure. The right forearm to the wrist was prepped and draped in usual sterile manner. The right radial artery was then identified with ultrasound, and its morphology documented permanently in the radiology PACS system. A dorsal palmar anastomosis was verified to be present. Using ultrasound guidance and micropuncture set access into the right radial artery was obtained with a 4/5 French radial sheath over a 0.018 inch micro guidewire. The guidewire and obturator were removed. Good aspiration was obtained from the side port of the radial sheath. A cocktail of 2000 units of heparin, 2.5 mg of verapamil, and 200 mcg of nitroglycerin was then infused in diluted form through sheath without event. A right radial arteriogram was performed. Over a 0.035 inch Roadrunner guidewire, a 5 [place] Simmons 2 diagnostic catheter was then advanced to the aortic arch region, selectively positioned in the origin of the right vertebral artery, the right common carotid artery, the left common carotid artery and the left vertebral artery. A wrist band was then applied for hemostasis at the right radial puncture site. Distal right radial pulse was verified to be present. FINDINGS: The right vertebral artery origin demonstrates a severe stenosis. More distally, the vessel is seen to opacify to the cranial skull base. There is approximately 50%  stenosis of the right vertebrobasilar junction just proximal to the origin of the right posterior-inferior cerebellar artery. Distal to this contrast is seen in the basilar artery and the posterior cerebral arteries. Mixed with unopacified blood is seen in the basilar artery from the contralateral vertebral artery. The right common carotid arteriogram demonstrates the right external carotid artery and its major branches to be widely patent. The right internal carotid artery has an eccentric smooth atherosclerotic plaque along the lateral aspect just distal to the bulb with approximately 20% stenosis by the NASCET criteria. More distally, the vessel is seen to opacify to the cranial skull base. Mild caliber irregularity probably related to arteriosclerosis is seen of the cavernous segment of the right internal carotid artery. The right middle cerebral artery and the right anterior cerebral artery opacify into the capillary and venous phases. Prompt opacification of the left anterior cerebral A2 segment and distally is seen  via anterior communicating artery. A small infundibulum is evident in the right posterior communicating artery. The left common carotid arteriogram demonstrates the left external carotid artery and its major branches to be patent. Left internal carotid artery at the bulb and its proximal 1/3 demonstrates slow ascent of contrast to near complete occlusion. The delayed images demonstrated ascent of contrast to the distal cervical left ICA. More distally, reconstitution of the left internal carotid artery and the supraclinoid segment is noted from left external carotid artery branches via the ipsilateral ophthalmic artery. Opacification of the right left middle cerebral artery distribution is evident. No opacification of the left anterior cerebral artery is evident. The left vertebral artery origin is widely patent. The vessel is seen to opacify to the cranial skull base. Patency is seen of the left  vertebrobasilar junction with a 50%-60% stenosis of the left vertebrobasilar junction just proximal to the origin of the left posterior-inferior cerebellar artery. Mild caliber irregularity of the proximal PICA suggests intracranial arteriosclerosis. More distally, opacification is seen of the basilar artery, the posterior cerebral arteries, the superior cerebellar arteries and the anterior-inferior cerebellar arteries into the capillary and venous phases. Unopacified blood is seen in the basilar artery from the contralateral vertebral artery. 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 Partial reconstitution of the left internal carotid artery supraclinoid segment from the ipsilateral ophthalmic artery. Non opacification of the left anterior cerebral artery A1 segment. 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 Approximately 50% to 60% stenosis of the dominant left vertebrobasilar junction just proximal to the left posterior-inferior cerebellar artery. Approximately 50% stenosis of the right vertebrobasilar junction proximal to the right posteroinferior cerebellar artery. PLAN: Finding discussed with the patient and the referring neurologist. Electronically Signed   By: Luanne Bras M.D.   On: 01/17/2022 08:16   IR ANGIO VERTEBRAL SEL SUBCLAVIAN INNOMINATE BILAT MOD SED  Result Date: 01/17/2022 CLINICAL DATA:  History of transient ischemic attack comprising of right hand weakness with slurred speech evaluation of the brain revealed numerous diffusion-weighted abnormalities in the left middle cerebral artery distribution. CT of the head and neck revealed left internal carotid artery proximal occlusion and questionable string sign. EXAM: BILATERAL COMMON CAROTID AND INNOMINATE ANGIOGRAPHY COMPARISON:  CT angiogram of the head and neck of January 14, 2022.  MEDICATIONS: Heparin 2000 units IV. No antibiotic was administered within 1 hour of the procedure. ANESTHESIA/SEDATION: Versed 1 mg IV; Fentanyl 25 mcg IV Moderate Sedation Time:  35 minutes The patient was continuously monitored during the procedure by the interventional radiology nurse under my direct supervision. CONTRAST:  Omnipaque 300 approximately 65 mL. FLUOROSCOPY TIME:  Fluoroscopy Time: 13 minutes 6 seconds (1170 mGy). COMPLICATIONS: None immediate. TECHNIQUE: Informed written consent was obtained from the patient after a thorough discussion of the procedural risks, benefits and alternatives. All questions were addressed. Maximal Sterile Barrier Technique was utilized including caps, mask, sterile gowns, sterile gloves, sterile drape, hand hygiene and skin antiseptic. A timeout was performed prior to the initiation of the procedure. The right forearm to the wrist was prepped and draped in usual sterile manner. The right radial artery was then identified with ultrasound, and its morphology documented permanently in the radiology PACS system. A dorsal palmar anastomosis was verified to be present. Using ultrasound guidance and micropuncture set access into the right radial artery was obtained with a 4/5 [place] radial  sheath over a 0.018 inch micro guidewire. The guidewire and obturator were removed. Good aspiration was obtained from the side port of the radial sheath. A cocktail of 2000 units of heparin, 2.5 mg of verapamil, and 200 mcg of nitroglycerin was then infused in diluted form through sheath without event. A right radial arteriogram was performed. Over a 0.035 inch Roadrunner guidewire, a 5 [place] Simmons 2 diagnostic catheter was then advanced to the aortic arch region, selectively positioned in the origin of the right vertebral artery, the right common carotid artery, the left common carotid artery and the left vertebral artery. A wrist band was then applied for hemostasis at the right radial  puncture site. Distal right radial pulse was verified to be present. FINDINGS: The right vertebral artery origin demonstrates a severe stenosis. More distally, the vessel is seen to opacify to the cranial skull base. There is approximately 50% stenosis of the right vertebrobasilar junction just proximal to the origin of the right posterior-inferior cerebellar artery. Distal to this contrast is seen in the basilar artery and the posterior cerebral arteries. Mixed with unopacified blood is seen in the basilar artery from the contralateral vertebral artery. The right common carotid arteriogram demonstrates the right external carotid artery and its major branches to be widely patent. The right internal carotid artery has an eccentric smooth atherosclerotic plaque along the lateral aspect just distal to the bulb with approximately 20% stenosis by the NASCET criteria. More distally, the vessel is seen to opacify to the cranial skull base. Mild caliber irregularity probably related to arteriosclerosis is seen of the cavernous segment of the right internal carotid artery. The right middle cerebral artery and the right anterior cerebral artery opacify into the capillary and venous phases. Prompt opacification of the left anterior cerebral A2 segment and distally is seen via anterior communicating artery. A small infundibulum is evident in the right posterior communicating artery. The left common carotid arteriogram demonstrates the left external carotid artery and its major branches to be patent. Left internal carotid artery at the bulb and its proximal 1/3 demonstrates slow ascent of contrast to near complete occlusion. The delayed images demonstrated ascent of contrast to the distal cervical left ICA. More distally, reconstitution of the left internal carotid artery and the supraclinoid segment is noted from left external carotid artery branches via the ipsilateral ophthalmic artery. Opacification of the right left middle  cerebral artery distribution is evident. No opacification of the left anterior cerebral artery is evident. The left vertebral artery origin is widely patent. The vessel is seen to opacify to the cranial skull base. Patency is seen of the left vertebrobasilar junction with a 50%-60% stenosis of the left vertebrobasilar junction just proximal to the origin of the left posterior-inferior cerebellar artery. Mild caliber irregularity of the proximal PICA suggests intracranial arteriosclerosis. More distally, opacification is seen of the basilar artery, the posterior cerebral arteries, the superior cerebellar arteries and the anterior-inferior cerebellar arteries into the capillary and venous phases. Unopacified blood is seen in the basilar artery from the contralateral vertebral artery. 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 Partial reconstitution of the left internal carotid artery supraclinoid segment from the ipsilateral ophthalmic artery. Non opacification of the left anterior cerebral artery A1 segment. 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 Approximately 50% to 60% stenosis of the dominant left vertebrobasilar junction just proximal to the  left posterior-inferior cerebellar artery. Approximately 50% stenosis of the right vertebrobasilar junction proximal to the right posteroinferior cerebellar artery. PLAN: Finding discussed with the patient and the referring neurologist. Electronically Signed   By: Luanne Bras M.D.   On: 01/17/2022 08:16    Labs:  CBC: Recent Labs    01/10/22 0744 01/14/22 1342 01/14/22 1349 01/16/22 0355 01/18/22 0329  WBC 11.4* 6.6  --  6.7 7.1  HGB 9.1* 9.4* 9.2* 8.5* 8.2*  HCT 28.1* 28.7* 27.0* 25.7* 24.8*  PLT 167 205  --  219 223    COAGS: Recent Labs    08/29/21 0637 08/30/21 0241 01/14/22 1342 01/16/22 0355   INR 1.7* 1.5* 1.0 1.0  APTT  --   --  27  --     BMP: Recent Labs    01/10/22 0744 01/14/22 1342 01/14/22 1349 01/16/22 0355 01/18/22 0329  NA 138 136 135 133* 135  K 4.6 4.5 4.5 5.2* 4.7  CL 106 105 104 104 106  CO2 23 23  --  19* 21*  GLUCOSE 125* 114* 110* 88 95  BUN 33* 32* 30* 38* 27*  CALCIUM 8.6* 8.3*  --  8.5* 8.6*  CREATININE 4.27* 3.68* 3.90* 4.73* 4.72*  GFRNONAA 14* 17*  --  12* 12*    LIVER FUNCTION TESTS: Recent Labs    01/03/22 0802 01/10/22 0744 01/14/22 1342 01/15/22 1514 01/18/22 0329  BILITOT 0.2* 0.3 0.6 0.3  --   AST 8* 9* 14* 11*  --   ALT 6 7 12 11   --   ALKPHOS 84 95 86 72  --   PROT 5.6* 5.7* 5.9* 5.0*  --   ALBUMIN 3.3* 3.4* 3.2* 2.7* 2.9*    TUMOR MARKERS: No results for input(s): AFPTM, CEA, CA199, CHROMGRNA in the last 8760 hours.  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 Plan for angiogram with intended stent angioplasty today. Pt received Brilinta last pm and this am. Risks and benefits of cerebral angio with carotid stent angioplasty were discussed with the patient including, but not limited to bleeding, infection, vascular injury or contrast induced renal failure.  This interventional procedure involves the use of X-rays and because of the nature of the planned procedure, it is possible that we will have prolonged use of X-ray fluoroscopy.  Potential radiation risks to you include (but are not limited to) the following: - A slightly elevated risk for cancer  several years later in life. This risk is typically less than 0.5% percent. This risk is low in comparison to the normal incidence of human cancer, which is 33% for women and 50% for men according to the [employer]. - Radiation induced injury can include skin redness, resembling a rash, tissue breakdown / ulcers and hair loss (which can be temporary or permanent).   The likelihood of either of these occurring depends on  the difficulty of the procedure and whether you are sensitive to radiation due to previous procedures, disease, or genetic conditions.   IF your procedure requires a prolonged use of radiation, you will be notified and given written instructions for further action.  It is your responsibility to monitor the irradiated area for the 2 weeks following the procedure and to notify your physician if you are concerned that you have suffered a radiation induced injury.    All of the patient's questions were answered, patient is agreeable to proceed.  Consent signed and in chart.   Thank you for this  interesting consult.  I greatly enjoyed meeting MYSHAWN CHIRIBOGA and look forward to participating in their care.  A copy of this report was sent to the requesting provider on this date.  Electronically Signed: Ascencion Dike, PA-C 01/18/2022, 9:29 AM   I spent a total of 20 minutes  in face to face in clinical consultation, greater than 50% of which was counseling/coordinating care for carotid stent angioplasty

## 2022-01-18 NOTE — Sedation Documentation (Signed)
Handoff with PACU RNs at bedside.  Right femoral site remains level 0, dressing clean/dry/intact.  Patient upon waking up did tug at his foley catheter a few times but stat lock remains in place.

## 2022-01-18 NOTE — Progress Notes (Signed)
Report called to the cath. Lab RN, patient ready for transport; c/o uncomfortable left hip pain; repeated prn medication for pain.

## 2022-01-18 NOTE — Progress Notes (Signed)
Orthopedic Tech Progress Note Patient Details:  Caleb Simmons [birth year] [id number]  Ortho Devices Type of Ortho Device: Knee Immobilizer Ortho Device/Splint Location: LLE Ortho Device/Splint Interventions: Application, Ordered   Post Interventions Patient Tolerated: Well  Chip Boer 01/18/2022, 5:37 PM

## 2022-01-18 NOTE — Progress Notes (Addendum)
Verbal instruction to give Aspirin and brilinta po via phone from Dr. Estanislado Pandy as ordered on [facility].

## 2022-01-18 NOTE — Progress Notes (Signed)
PROGRESS NOTE    Caleb Simmons  FWY:[id number] DOB: 11/02/1951 DOA: 01/14/2022 PCP: Merryl Hacker, No    Brief Narrative:  Caleb Simmons is a 71 year old male with past medical history significant for amyloidosis, plasma cell myeloma, ESRD on HD MWF, tobacco use disorder, chronic combined systolic/diastolic congestive heart failure who presents to [facility] ED on 1/23 with facial droop and slurred speech.  Work-up revealed acute left MCA territory infarct, likely watershed from hypoperfusion in the setting of left ICA stenosis.  Patient was transferred to [facility] on 01/15/2022 for neurology evaluation.   Assessment & Plan:   Principal Problem:   Acute ischemic left MCA stroke (HCC) Active Problems:   AL amyloidosis (HCC)   Malnutrition of moderate degree   Port-A-Cath in place   ESRD (end stage renal disease) (HCC)   BPH (benign prostatic hyperplasia)   Tobacco abuse   Anemia due to chronic kidney disease   PAD (peripheral artery disease) (HCC)   Acute ischemic left MCA CVA, POA Left ICA occlusion/high-grade stenosis Patient presenting to ED with dysarthria, facial droop and right-sided weakness.  MR brain with left MCA territory multiple infarcts.  CT angiogram head with complete occlusion of the left ICA as well as right ICA narrowing, occlusion of the left A1, moderate stenosis of the right vertebral artery.  Possible watershed infarct due to hypoperfusion in the setting of bilateral PAD and complete total occlusion of the left ICA. --Neurology/neuro interventional radiology following, appreciate assistance --LDL 85 --Hemoglobin A1c: 5.6 --TTE: LVEF 55-60%, LV normal function, no R WMA, mild LVH, G1DD, no atrial shunt --PT/OT with no recommendations --SLP evaluation: OP SLP --Aspirin 81 mg p.o. daily, Brilinta 90mg  PO BID --Atorvastatin 40 mg p.o. daily --Continue monitor on telemetry, no arrhythmias other than PVCs noted such far --Dr. Estanislado Pandy plans cerebral angioplasty with  possible stent placement today, n.p.o.  ESRD on HD MWF --Nephrology following, appreciate assistance --HD today  AL amyloidosis Plasma cell myeloma Follows with medical oncology outpatient, Dr. Burr Medico.  Currently on chemotherapy with weekly oral Cytoxan, dexa and Velcade injection starting 09/10/21 and daratumumab injection 09/27/21. Plan 6 month treatment. -- Outpatient follow-up with oncology  Dyslipidemia/hypertriglyceridemia Lipid panel with HDL 28, LDL 85, total cholesterol 239, triglycerides 535. --Given ERSD, unable to order Tricor/gemfibrozil; may benefit from lipid clinic evaluation, consideration of Vascepa --Started atorvastatin 40 mg p.o. daily  Chronic orthostatic hypotension: Midodrine 5 mg p.o. 3 times daily  BPH: Tamsulosin 0.4 mg p.o. daily  Anemia secondary to chronic renal disease Hemoglobin 8.5, stable.  Anemia panel with iron 145, TIBC 308, ferritin 163, folate 77.1, B12 322.  Tobacco use disorder Counseled on need for complete cessation.  GERD: Continue PPI  Anxiety: Continue Xanax  Malnutrition of moderate degree Body mass index is 23.41 kg/m. --Dietitian consult --Remeron 30 mg p.o. nightly   DVT prophylaxis: SCD's Start: 01/14/22 2013   Code Status: Full Code Family Communication: Updated friend present at bedside this morning.  Disposition Plan:  Level of care: Telemetry Medical Status is: Inpatient  Remains inpatient appropriate because: Pending cerebral angioplasty with possible stent placement plan for today    Consultants:  Neurology Neuro IR Nephrology  Procedures:  TTE: Pending Cerebral angiogram: Pending  Antimicrobials:  None   Subjective: Patient seen examined at bedside, resting comfortably.  Slightly anxious regarding procedure planned for today.  Friend present at bedside.  No other complaints or concerns at this time.  Denies headache, no chest pain, no palpitations, no shortness of breath, no  abdominal pain, no  fever/chills/night sweats, no nausea/vomiting/diarrhea.  No acute concerns overnight per staff.  Objective: Vitals:   01/17/22 2005 01/17/22 2356 01/18/22 0355 01/18/22 0734  BP: 112/62 130/75 127/71 128/78  Pulse: 69 74 72 80  Resp: 16 12 16 16   Temp: 98 F (36.7 C) 98 F (36.7 C) 97.6 F (36.4 C) 98 F (36.7 C)  TempSrc: Oral Oral Oral Oral  SpO2: 100% 99% 98% 97%  Weight:       No intake or output data in the 24 hours ending 01/18/22 1012  Filed Weights   01/16/22 0820  Weight: 74 kg    Examination:  General exam: Appears calm and comfortable, chronically ill in appearance Respiratory system: Clear to auscultation. Respiratory effort normal.  On room air Cardiovascular system: S1 & S2 heard, RRR. No JVD, murmurs, rubs, gallops or clicks. No pedal edema. Gastrointestinal system: Abdomen is nondistended, soft and nontender. No organomegaly or masses felt. Normal bowel sounds heard. Central nervous system: Alert and oriented. No focal neurological deficits. Extremities: Symmetric 5 x 5 power. Skin: No rashes, lesions or ulcers Psychiatry: Judgement and insight appear normal. Mood & affect appropriate.     Data Reviewed: I have personally reviewed following labs and imaging studies  CBC: Recent Labs  Lab 01/14/22 1342 01/14/22 1349 01/16/22 0355 01/18/22 0329  WBC 6.6  --  6.7 7.1  NEUTROABS 4.6  --   --   --   HGB 9.4* 9.2* 8.5* 8.2*  HCT 28.7* 27.0* 25.7* 24.8*  MCV 91.4  --  91.5 90.5  PLT 205  --  219 371   Basic Metabolic Panel: Recent Labs  Lab 01/14/22 1342 01/14/22 1349 01/15/22 1514 01/16/22 0355 01/18/22 0329  NA 136 135  --  133* 135  K 4.5 4.5  --  5.2* 4.7  CL 105 104  --  104 106  CO2 23  --   --  19* 21*  GLUCOSE 114* 110*  --  88 95  BUN 32* 30*  --  38* 27*  CREATININE 3.68* 3.90*  --  4.73* 4.72*  CALCIUM 8.3*  --   --  8.5* 8.6*  MG  --   --  1.9  --   --   PHOS  --   --  5.7*  --  5.0*   GFR: Estimated Creatinine Clearance:  14.8 mL/min (A) (by C-G formula based on SCr of 4.72 mg/dL (H)). Liver Function Tests: Recent Labs  Lab 01/14/22 1342 01/15/22 1514 01/18/22 0329  AST 14* 11*  --   ALT 12 11  --   ALKPHOS 86 72  --   BILITOT 0.6 0.3  --   PROT 5.9* 5.0*  --   ALBUMIN 3.2* 2.7* 2.9*   No results for input(s): LIPASE, AMYLASE in the last 168 hours. No results for input(s): AMMONIA in the last 168 hours. Coagulation Profile: Recent Labs  Lab 01/14/22 1342 01/16/22 0355  INR 1.0 1.0   Cardiac Enzymes: No results for input(s): CKTOTAL, CKMB, CKMBINDEX, TROPONINI in the last 168 hours. BNP (last 3 results) No results for input(s): PROBNP in the last 8760 hours. HbA1C: Recent Labs    01/16/22 0355  HGBA1C 5.6   CBG: No results for input(s): GLUCAP in the last 168 hours. Lipid Profile: Recent Labs    01/15/22 1514  CHOL 239*  HDL 28*  LDLCALC UNABLE TO CALCULATE IF TRIGLYCERIDE OVER 400 mg/dL  TRIG 535*  CHOLHDL 8.5  LDLDIRECT 85.5  Thyroid Function Tests: No results for input(s): TSH, T4TOTAL, FREET4, T3FREE, THYROIDAB in the last 72 hours. Anemia Panel: No results for input(s): VITAMINB12, FOLATE, FERRITIN, TIBC, IRON, RETICCTPCT in the last 72 hours.  Sepsis Labs: No results for input(s): PROCALCITON, LATICACIDVEN in the last 168 hours.  Recent Results (from the past 240 hour(s))  Resp Panel by RT-PCR (Flu A&B, Covid) Nasopharyngeal Swab     Status: None   Collection Time: 01/14/22  1:43 PM   Specimen: Nasopharyngeal Swab; Nasopharyngeal(NP) swabs in vial transport medium  Result Value Ref Range Status   SARS Coronavirus 2 by RT PCR NEGATIVE NEGATIVE Final    Comment: (NOTE) SARS-CoV-2 target nucleic acids are NOT DETECTED.  The SARS-CoV-2 RNA is generally detectable in upper respiratory specimens during the acute phase of infection. The lowest concentration of SARS-CoV-2 viral copies this assay can detect is 138 copies/mL. A negative result does not preclude  SARS-Cov-2 infection and should not be used as the sole basis for treatment or other patient management decisions. A negative result may occur with  improper specimen collection/handling, submission of specimen other than nasopharyngeal swab, presence of viral mutation(s) within the areas targeted by this assay, and inadequate number of viral copies(<138 copies/mL). A negative result must be combined with clinical observations, patient history, and epidemiological information. The expected result is Negative.  Fact Sheet for Patients:  EntrepreneurPulse.com.au  Fact Sheet for Healthcare Providers:  IncredibleEmployment.be  This test is no t yet approved or cleared by the [place] FDA and  has been authorized for detection and/or diagnosis of SARS-CoV-2 by FDA under an Emergency Use Authorization (EUA). This EUA will remain  in effect (meaning this test can be used) for the duration of the COVID-19 declaration under Section 564(b)(1) of the Act, 21 U.S.C.section 360bbb-3(b)(1), unless the authorization is terminated  or revoked sooner.       Influenza A by PCR NEGATIVE NEGATIVE Final   Influenza B by PCR NEGATIVE NEGATIVE Final    Comment: (NOTE) The Xpert Xpress SARS-CoV-2/FLU/RSV plus assay is intended as an aid in the diagnosis of influenza from Nasopharyngeal swab specimens and should not be used as a sole basis for treatment. Nasal washings and aspirates are unacceptable for Xpert Xpress SARS-CoV-2/FLU/RSV testing.  Fact Sheet for Patients: EntrepreneurPulse.com.au  Fact Sheet for Healthcare Providers: IncredibleEmployment.be  This test is not yet approved or cleared by the [place] FDA and has been authorized for detection and/or diagnosis of SARS-CoV-2 by FDA under an Emergency Use Authorization (EUA). This EUA will remain in effect (meaning this test can be used) for the duration of  the COVID-19 declaration under Section 564(b)(1) of the Act, 21 U.S.C. section 360bbb-3(b)(1), unless the authorization is terminated or revoked.  Performed at [facility], [place] [address], [place], [place] [address]          Radiology Studies: IR US Guide Vasc Access Right  Result Date: 01/17/2022 CLINICAL DATA:  History of transient ischemic attack comprising of right hand weakness with slurred speech evaluation of the brain revealed numerous diffusion-weighted abnormalities in the left middle cerebral artery distribution. CT of the head and neck revealed left internal carotid artery proximal occlusion and questionable string sign. EXAM: BILATERAL COMMON CAROTID AND INNOMINATE ANGIOGRAPHY COMPARISON:  CT angiogram of the head and neck of January 14, 2022. MEDICATIONS: Heparin 2000 units IV. No antibiotic was administered within 1 hour of the procedure. ANESTHESIA/SEDATION: Versed 1 mg IV; Fentanyl 25 mcg IV Moderate Sedation Time:  35  minutes The patient was continuously monitored during the procedure by the interventional radiology nurse under my direct supervision. CONTRAST:  Omnipaque 300 approximately 65 mL. FLUOROSCOPY TIME:  Fluoroscopy Time: 13 minutes 6 seconds (1170 mGy). COMPLICATIONS: None immediate. TECHNIQUE: Informed written consent was obtained from the patient after a thorough discussion of the procedural risks, benefits and alternatives. All questions were addressed. Maximal Sterile Barrier Technique was utilized including caps, mask, sterile gowns, sterile gloves, sterile drape, hand hygiene and skin antiseptic. A timeout was performed prior to the initiation of the procedure. The right forearm to the wrist was prepped and draped in usual sterile manner. The right radial artery was then identified with ultrasound, and its morphology documented permanently in the radiology PACS system. A dorsal palmar anastomosis was verified to be present. Using ultrasound  guidance and micropuncture set access into the right radial artery was obtained with a 4/5 French radial sheath over a 0.018 inch micro guidewire. The guidewire and obturator were removed. Good aspiration was obtained from the side port of the radial sheath. A cocktail of 2000 units of heparin, 2.5 mg of verapamil, and 200 mcg of nitroglycerin was then infused in diluted form through sheath without event. A right radial arteriogram was performed. Over a 0.035 inch Roadrunner guidewire, a 5 [place] Simmons 2 diagnostic catheter was then advanced to the aortic arch region, selectively positioned in the origin of the right vertebral artery, the right common carotid artery, the left common carotid artery and the left vertebral artery. A wrist band was then applied for hemostasis at the right radial puncture site. Distal right radial pulse was verified to be present. FINDINGS: The right vertebral artery origin demonstrates a severe stenosis. More distally, the vessel is seen to opacify to the cranial skull base. There is approximately 50% stenosis of the right vertebrobasilar junction just proximal to the origin of the right posterior-inferior cerebellar artery. Distal to this contrast is seen in the basilar artery and the posterior cerebral arteries. Mixed with unopacified blood is seen in the basilar artery from the contralateral vertebral artery. The right common carotid arteriogram demonstrates the right external carotid artery and its major branches to be widely patent. The right internal carotid artery has an eccentric smooth atherosclerotic plaque along the lateral aspect just distal to the bulb with approximately 20% stenosis by the NASCET criteria. More distally, the vessel is seen to opacify to the cranial skull base. Mild caliber irregularity probably related to arteriosclerosis is seen of the cavernous segment of the right internal carotid artery. The right middle cerebral artery and the right anterior  cerebral artery opacify into the capillary and venous phases. Prompt opacification of the left anterior cerebral A2 segment and distally is seen via anterior communicating artery. A small infundibulum is evident in the right posterior communicating artery. The left common carotid arteriogram demonstrates the left external carotid artery and its major branches to be patent. Left internal carotid artery at the bulb and its proximal 1/3 demonstrates slow ascent of contrast to near complete occlusion. The delayed images demonstrated ascent of contrast to the distal cervical left ICA. More distally, reconstitution of the left internal carotid artery and the supraclinoid segment is noted from left external carotid artery branches via the ipsilateral ophthalmic artery. Opacification of the right left middle cerebral artery distribution is evident. No opacification of the left anterior cerebral artery is evident. The left vertebral artery origin is widely patent. The vessel is seen to opacify to the cranial skull base. Patency  is seen of the left vertebrobasilar junction with a 50%-60% stenosis of the left vertebrobasilar junction just proximal to the origin of the left posterior-inferior cerebellar artery. Mild caliber irregularity of the proximal PICA suggests intracranial arteriosclerosis. More distally, opacification is seen of the basilar artery, the posterior cerebral arteries, the superior cerebellar arteries and the anterior-inferior cerebellar arteries into the capillary and venous phases. Unopacified blood is seen in the basilar artery from the contralateral vertebral artery. 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 Partial reconstitution of the left internal carotid artery supraclinoid segment from the ipsilateral ophthalmic artery. Non opacification of the left anterior cerebral  artery A1 segment. 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 Approximately 50% to 60% stenosis of the dominant left vertebrobasilar junction just proximal to the left posterior-inferior cerebellar artery. Approximately 50% stenosis of the right vertebrobasilar junction proximal to the right posteroinferior cerebellar artery. PLAN: Finding discussed with the patient and the referring neurologist. Electronically Signed   By: Luanne Bras M.D.   On: 01/17/2022 08:16   ECHOCARDIOGRAM COMPLETE  Result Date: 01/16/2022    ECHOCARDIOGRAM REPORT   Patient Name:   TRAVARIS KOSH Date of Exam: 01/16/2022 Medical Rec #:  [id number]    Height:       70.0 in Accession #:    [id number]   Weight:       163.1 lb Date of Birth:  Jun 25, 1951     BSA:          1.914 m Patient Age:    56 years     BP:           128/79 mmHg Patient Gender: M            HR:           83 bpm. Exam Location:  Inpatient Procedure: 2D Echo Indications:    Stroke  History:        Patient has prior history of Echocardiogram examinations, most                 recent 09/08/2021.  Sonographer:    [name] Referring Phys: [place]  1. Left ventricular ejection fraction, by estimation, is 55 to 60%. The left ventricle has normal function. The left ventricle has no regional wall motion abnormalities. There is mild left ventricular hypertrophy. Left ventricular diastolic parameters are consistent with Grade I diastolic dysfunction (impaired relaxation).  2. Right ventricular systolic function is normal. The right ventricular size is normal. Tricuspid regurgitation signal is inadequate for assessing PA pressure.  3. The mitral valve is normal in structure. Trivial mitral valve regurgitation. No evidence of mitral stenosis.  4. The aortic valve is tricuspid. Aortic valve regurgitation is not visualized. Aortic valve sclerosis/calcification is present, without any evidence of aortic stenosis.  5. Aortic dilatation  noted. There is mild dilatation of the ascending aorta, measuring 38 mm.  6. The inferior vena cava is normal in size with greater than 50% respiratory variability, suggesting right atrial pressure of 3 mmHg.  7. A small pericardial effusion is present. FINDINGS  Left Ventricle: Left ventricular ejection fraction, by estimation, is 55 to 60%. The left ventricle has normal function. The left ventricle has no regional wall motion abnormalities. The left ventricular internal cavity size was normal in size. There is  mild left ventricular hypertrophy. Left ventricular diastolic parameters  are consistent with Grade I diastolic dysfunction (impaired relaxation). Right Ventricle: The right ventricular size is normal. No increase in right ventricular wall thickness. Right ventricular systolic function is normal. Tricuspid regurgitation signal is inadequate for assessing PA pressure. Left Atrium: Left atrial size was normal in size. Right Atrium: Right atrial size was normal in size. Pericardium: A small pericardial effusion is present. Mitral Valve: The mitral valve is normal in structure. There is mild calcification of the mitral valve leaflet(s). Mild mitral annular calcification. Trivial mitral valve regurgitation. No evidence of mitral valve stenosis. Tricuspid Valve: The tricuspid valve is normal in structure. Tricuspid valve regurgitation is not demonstrated. Aortic Valve: The aortic valve is tricuspid. Aortic valve regurgitation is not visualized. Aortic valve sclerosis/calcification is present, without any evidence of aortic stenosis. Aortic valve mean gradient measures 5.0 mmHg. Aortic valve peak gradient measures 9.0 mmHg. Aortic valve area, by VTI measures 1.94 cm. Pulmonic Valve: The pulmonic valve was normal in structure. Pulmonic valve regurgitation is not visualized. Aorta: The aortic root is normal in size and structure and aortic dilatation noted. There is mild dilatation of the ascending aorta, measuring  38 mm. Venous: The inferior vena cava is normal in size with greater than 50% respiratory variability, suggesting right atrial pressure of 3 mmHg. IAS/Shunts: No atrial level shunt detected by color flow Doppler.  LEFT VENTRICLE PLAX 2D LVIDd:         5.00 cm   Diastology LVIDs:         3.80 cm   LV e' medial:    5.77 cm/s LV PW:         1.20 cm   LV E/e' medial:  8.4 LV IVS:        1.20 cm   LV e' lateral:   12.10 cm/s LVOT diam:     2.00 cm   LV E/e' lateral: 4.0 LV SV:         42 LV SV Index:   22 LVOT Area:     3.14 cm  RIGHT VENTRICLE RV Basal diam:  3.30 cm RV Mid diam:    2.50 cm RV S prime:     13.60 cm/s TAPSE (M-mode): 1.7 cm LEFT ATRIUM             Index        RIGHT ATRIUM           Index LA diam:        3.30 cm 1.72 cm/m   RA Area:     13.50 cm LA Vol (A2C):   42.2 ml 22.05 ml/m  RA Volume:   31.50 ml  16.46 ml/m LA Vol (A4C):   49.4 ml 25.81 ml/m LA Biplane Vol: 46.0 ml 24.03 ml/m  AORTIC VALVE AV Area (Vmax):    2.08 cm AV Area (Vmean):   1.97 cm AV Area (VTI):     1.94 cm AV Vmax:           150.00 cm/s AV Vmean:          99.300 cm/s AV VTI:            0.219 m AV Peak Grad:      9.0 mmHg AV Mean Grad:      5.0 mmHg LVOT Vmax:         99.25 cm/s LVOT Vmean:        62.150 cm/s LVOT VTI:          0.135 m LVOT/AV VTI ratio: 0.62  AORTA Ao Root diam: 3.40 cm Ao Asc diam:  3.80 cm MITRAL VALVE MV Area (PHT): 4.57 cm    SHUNTS MV Decel Time: 166 msec    Systemic VTI:  0.14 m MV E velocity: 48.40 cm/s  Systemic Diam: 2.00 cm MV A velocity: 67.70 cm/s MV E/A ratio:  0.71 Dalton McleanMD Electronically signed by Franki Monte Signature Date/Time: 01/16/2022/5:45:45 PM    Final    DG HIP UNILAT WITH PELVIS 2-3 VIEWS LEFT  Result Date: 01/16/2022 CLINICAL DATA:  Left hip pain.  No injury. EXAM: DG HIP (WITH OR WITHOUT PELVIS) 2-3V LEFT COMPARISON:  Hip x-ray 01/14/2022. FINDINGS: There is no evidence of hip fracture or dislocation. There is no evidence of arthropathy or other focal bone  abnormality. IMPRESSION: Negative. Electronically Signed   By: Ronney Asters M.D.   On: 01/16/2022 19:24   IR ANGIO INTRA EXTRACRAN SEL COM CAROTID INNOMINATE BILAT MOD SED  Result Date: 01/17/2022 CLINICAL DATA:  History of transient ischemic attack comprising of right hand weakness with slurred speech evaluation of the brain revealed numerous diffusion-weighted abnormalities in the left middle cerebral artery distribution. CT of the head and neck revealed left internal carotid artery proximal occlusion and questionable string sign. EXAM: BILATERAL COMMON CAROTID AND INNOMINATE ANGIOGRAPHY COMPARISON:  CT angiogram of the head and neck of January 14, 2022. MEDICATIONS: Heparin 2000 units IV. No antibiotic was administered within 1 hour of the procedure. ANESTHESIA/SEDATION: Versed 1 mg IV; Fentanyl 25 mcg IV Moderate Sedation Time:  35 minutes The patient was continuously monitored during the procedure by the interventional radiology nurse under my direct supervision. CONTRAST:  Omnipaque 300 approximately 65 mL. FLUOROSCOPY TIME:  Fluoroscopy Time: 13 minutes 6 seconds (1170 mGy). COMPLICATIONS: None immediate. TECHNIQUE: Informed written consent was obtained from the patient after a thorough discussion of the procedural risks, benefits and alternatives. All questions were addressed. Maximal Sterile Barrier Technique was utilized including caps, mask, sterile gowns, sterile gloves, sterile drape, hand hygiene and skin antiseptic. A timeout was performed prior to the initiation of the procedure. The right forearm to the wrist was prepped and draped in usual sterile manner. The right radial artery was then identified with ultrasound, and its morphology documented permanently in the radiology PACS system. A dorsal palmar anastomosis was verified to be present. Using ultrasound guidance and micropuncture set access into the right radial artery was obtained with a 4/5 French radial sheath over a 0.018 inch micro  guidewire. The guidewire and obturator were removed. Good aspiration was obtained from the side port of the radial sheath. A cocktail of 2000 units of heparin, 2.5 mg of verapamil, and 200 mcg of nitroglycerin was then infused in diluted form through sheath without event. A right radial arteriogram was performed. Over a 0.035 inch Roadrunner guidewire, a 5 [place] Simmons 2 diagnostic catheter was then advanced to the aortic arch region, selectively positioned in the origin of the right vertebral artery, the right common carotid artery, the left common carotid artery and the left vertebral artery. A wrist band was then applied for hemostasis at the right radial puncture site. Distal right radial pulse was verified to be present. FINDINGS: The right vertebral artery origin demonstrates a severe stenosis. More distally, the vessel is seen to opacify to the cranial skull base. There is approximately 50% stenosis of the right vertebrobasilar junction just proximal to the origin of the right posterior-inferior cerebellar artery. Distal to this contrast is seen in the basilar artery and the posterior  cerebral arteries. Mixed with unopacified blood is seen in the basilar artery from the contralateral vertebral artery. The right common carotid arteriogram demonstrates the right external carotid artery and its major branches to be widely patent. The right internal carotid artery has an eccentric smooth atherosclerotic plaque along the lateral aspect just distal to the bulb with approximately 20% stenosis by the NASCET criteria. More distally, the vessel is seen to opacify to the cranial skull base. Mild caliber irregularity probably related to arteriosclerosis is seen of the cavernous segment of the right internal carotid artery. The right middle cerebral artery and the right anterior cerebral artery opacify into the capillary and venous phases. Prompt opacification of the left anterior cerebral A2 segment and distally is  seen via anterior communicating artery. A small infundibulum is evident in the right posterior communicating artery. The left common carotid arteriogram demonstrates the left external carotid artery and its major branches to be patent. Left internal carotid artery at the bulb and its proximal 1/3 demonstrates slow ascent of contrast to near complete occlusion. The delayed images demonstrated ascent of contrast to the distal cervical left ICA. More distally, reconstitution of the left internal carotid artery and the supraclinoid segment is noted from left external carotid artery branches via the ipsilateral ophthalmic artery. Opacification of the right left middle cerebral artery distribution is evident. No opacification of the left anterior cerebral artery is evident. The left vertebral artery origin is widely patent. The vessel is seen to opacify to the cranial skull base. Patency is seen of the left vertebrobasilar junction with a 50%-60% stenosis of the left vertebrobasilar junction just proximal to the origin of the left posterior-inferior cerebellar artery. Mild caliber irregularity of the proximal PICA suggests intracranial arteriosclerosis. More distally, opacification is seen of the basilar artery, the posterior cerebral arteries, the superior cerebellar arteries and the anterior-inferior cerebellar arteries into the capillary and venous phases. Unopacified blood is seen in the basilar artery from the contralateral vertebral artery. 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 Partial reconstitution of the left internal carotid artery supraclinoid segment from the ipsilateral ophthalmic artery. Non opacification of the left anterior cerebral artery A1 segment. 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 Approximately 50% to 60% stenosis of the dominant left  vertebrobasilar junction just proximal to the left posterior-inferior cerebellar artery. Approximately 50% stenosis of the right vertebrobasilar junction proximal to the right posteroinferior cerebellar artery. PLAN: Finding discussed with the patient and the referring neurologist. Electronically Signed   By: Luanne Bras M.D.   On: 01/17/2022 08:16   IR ANGIO VERTEBRAL SEL SUBCLAVIAN INNOMINATE BILAT MOD SED  Result Date: 01/17/2022 CLINICAL DATA:  History of transient ischemic attack comprising of right hand weakness with slurred speech evaluation of the brain revealed numerous diffusion-weighted abnormalities in the left middle cerebral artery distribution. CT of the head and neck revealed left internal carotid artery proximal occlusion and questionable string sign. EXAM: BILATERAL COMMON CAROTID AND INNOMINATE ANGIOGRAPHY COMPARISON:  CT angiogram of the head and neck of January 14, 2022. MEDICATIONS: Heparin 2000 units IV. No antibiotic was administered within 1 hour of the procedure. ANESTHESIA/SEDATION: Versed 1 mg IV; Fentanyl 25 mcg IV Moderate Sedation Time:  35 minutes The patient was continuously monitored during the procedure by the interventional radiology nurse under my direct supervision. CONTRAST:  Omnipaque 300 approximately 65 mL. FLUOROSCOPY TIME:  Fluoroscopy Time: 13 minutes 6 seconds (1170 mGy). COMPLICATIONS: None immediate. TECHNIQUE: Informed written consent was obtained from the patient after a thorough discussion of the procedural risks, benefits and alternatives. All questions were addressed. Maximal Sterile Barrier Technique was utilized including caps, mask, sterile gowns, sterile gloves, sterile drape, hand hygiene and skin antiseptic. A timeout was performed prior to the initiation of the procedure. The right forearm to the wrist was prepped and draped in usual sterile manner. The right radial artery was then identified with ultrasound, and its morphology documented  permanently in the radiology PACS system. A dorsal palmar anastomosis was verified to be present. Using ultrasound guidance and micropuncture set access into the right radial artery was obtained with a 4/5 French radial sheath over a 0.018 inch micro guidewire. The guidewire and obturator were removed. Good aspiration was obtained from the side port of the radial sheath. A cocktail of 2000 units of heparin, 2.5 mg of verapamil, and 200 mcg of nitroglycerin was then infused in diluted form through sheath without event. A right radial arteriogram was performed. Over a 0.035 inch Roadrunner guidewire, a 5 [place] Simmons 2 diagnostic catheter was then advanced to the aortic arch region, selectively positioned in the origin of the right vertebral artery, the right common carotid artery, the left common carotid artery and the left vertebral artery. A wrist band was then applied for hemostasis at the right radial puncture site. Distal right radial pulse was verified to be present. FINDINGS: The right vertebral artery origin demonstrates a severe stenosis. More distally, the vessel is seen to opacify to the cranial skull base. There is approximately 50% stenosis of the right vertebrobasilar junction just proximal to the origin of the right posterior-inferior cerebellar artery. Distal to this contrast is seen in the basilar artery and the posterior cerebral arteries. Mixed with unopacified blood is seen in the basilar artery from the contralateral vertebral artery. The right common carotid arteriogram demonstrates the right external carotid artery and its major branches to be widely patent. The right internal carotid artery has an eccentric smooth atherosclerotic plaque along the lateral aspect just distal to the bulb with approximately 20% stenosis by the NASCET criteria. More distally, the vessel is seen to opacify to the cranial skull base. Mild caliber irregularity probably related to arteriosclerosis is seen of the  cavernous segment of the right internal carotid artery. The right middle cerebral artery and the right anterior cerebral artery opacify into the capillary and venous phases. Prompt opacification of the left anterior cerebral A2 segment and distally is seen via anterior communicating artery. A small infundibulum is evident in the right posterior communicating artery. The left common carotid arteriogram demonstrates the left external carotid artery and its major branches to be patent. Left internal carotid artery at the bulb and its proximal 1/3 demonstrates slow ascent of contrast to near complete occlusion. The delayed images demonstrated ascent of contrast to the distal cervical left ICA. More distally, reconstitution of the left internal carotid artery and the supraclinoid segment is noted from left external carotid artery branches via the ipsilateral ophthalmic artery. Opacification of the right left middle cerebral artery distribution is evident. No opacification of the left anterior cerebral artery is evident. The left vertebral artery origin is widely patent. The vessel is seen to opacify to the cranial skull base. Patency is seen of the left vertebrobasilar junction with a 50%-60% stenosis of the left vertebrobasilar junction just proximal to the origin of the left posterior-inferior cerebellar artery. Mild  caliber irregularity of the proximal PICA suggests intracranial arteriosclerosis. More distally, opacification is seen of the basilar artery, the posterior cerebral arteries, the superior cerebellar arteries and the anterior-inferior cerebellar arteries into the capillary and venous phases. Unopacified blood is seen in the basilar artery from the contralateral vertebral artery. 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 Partial reconstitution of the left internal carotid artery  supraclinoid segment from the ipsilateral ophthalmic artery. Non opacification of the left anterior cerebral artery A1 segment. 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 Approximately 50% to 60% stenosis of the dominant left vertebrobasilar junction just proximal to the left posterior-inferior cerebellar artery. Approximately 50% stenosis of the right vertebrobasilar junction proximal to the right posteroinferior cerebellar artery. PLAN: Finding discussed with the patient and the referring neurologist. Electronically Signed   By: Luanne Bras M.D.   On: 01/17/2022 08:16        Scheduled Meds:   stroke: mapping our early stages of recovery book   Does not apply Once   aspirin EC  81 mg Oral Daily   atorvastatin  40 mg Oral QHS   calcitRIOL  0.25 mcg Oral Q M,W,F-HD   Chlorhexidine Gluconate Cloth  6 each Topical [id number]   folic acid  2 mg Oral q AM   midodrine  5 mg Oral TID WC   mirtazapine  30 mg Oral QHS   mupirocin ointment  1 application Nasal BID   pantoprazole  40 mg Oral BID AC   sevelamer carbonate  800 mg Oral TID WC   tamsulosin  0.4 mg Oral q AM   ticagrelor  90 mg Oral BID   Continuous Infusions:   LOS: 4 days    Time spent: 39 minutes spent on chart review, discussion with nursing staff, consultants, updating family and interview/physical exam; more than 50% of that time was spent in counseling and/or coordination of care.    Summerlyn Fickel J [place] DO [facility] Available via Epic secure chat 7am-7pm After these hours, please refer to coverage provider listed on [email or web address] 01/18/2022, 10:12 AM

## 2022-01-18 NOTE — Transfer of Care (Signed)
Immediate Anesthesia Transfer of Care Note  Patient: Caleb Simmons  Procedure(s) Performed: Cerebral angioplasty with possible stenting  Patient Location: PACU  Anesthesia Type:General  Level of Consciousness: awake, alert  and oriented  Airway & Oxygen Therapy: Patient Spontanous Breathing  Post-op Assessment: Report given to RN and Patient moving all extremities X 4  Post vital signs: Reviewed and stable  Last Vitals:  Vitals Value Taken Time  BP 119/72 01/18/22 1638  Temp    Pulse 96 01/18/22 1639  Resp 29 01/18/22 1639  SpO2 100 % 01/18/22 1639  Vitals shown include unvalidated device data.  Last Pain:  Vitals:   01/18/22 1108  TempSrc: Oral  PainSc:          Complications: No notable events documented.

## 2022-01-18 NOTE — Procedures (Addendum)
INR. S./P Lt common carotid arteriogram . RT CFA approach. Findings. 1.Occluded RT ICA prox  to the cavernous seg. Distal reconstitution  via LT ophthalmic artery from multiple Lt ECA collaterals. 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 Post CT no ICH . 9F angioseal used for RT CFA hemostasis. Distal pulses intact.  Extubated. Denies any H/As,N/V or visual changes. Moving all 4s equally. Follows commands appropriately. Pupils 36mm Rt = LT Sluggish NO facial asymmetry. Oriented to hospital and year.  Speech clear. . Dr Estanislado Pandy MD

## 2022-01-18 NOTE — Anesthesia Procedure Notes (Signed)
Procedure Name: Intubation Date/Time: 01/18/2022 1:39 PM Performed by: Colin Benton, CRNA Pre-anesthesia Checklist: Patient identified, Emergency Drugs available, Suction available and Patient being monitored Patient Re-evaluated:Patient Re-evaluated prior to induction Oxygen Delivery Method: Circle system utilized Preoxygenation: Pre-oxygenation with 100% oxygen Induction Type: IV induction Ventilation: Mask ventilation without difficulty Laryngoscope Size: Miller and 2 Tube type: Oral Tube size: 7.5 mm Number of attempts: 1 Airway Equipment and Method: Stylet Placement Confirmation: ETT inserted through vocal cords under direct vision, positive ETCO2 and breath sounds checked- equal and bilateral Secured at: 22 cm Tube secured with: Tape Dental Injury: Teeth and Oropharynx as per pre-operative assessment

## 2022-01-18 NOTE — Sedation Documentation (Signed)
Right groin 8 fr Angioseal deployed at 1559. Post procedure imaging obtained  1610 Right femoral site level 0, dressing of guaze and tegaderm clean/dry/intact.

## 2022-01-18 NOTE — Progress Notes (Signed)
STROKE TEAM PROGRESS NOTE   SUBJECTIVE (INTERVAL HISTORY) His wife is at the bedside. Pt was seen at ICU after endovascular procedure today.  Patient currently awake alert, neuro intact. 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  No continue on aspirin and Brilinta.   OBJECTIVE Temp:  [97 F (36.1 C)-98 F (36.7 C)] 97.9 F (36.6 C) (01/27 1703) Pulse Rate:  [72-92] 92 (01/27 1703) Cardiac Rhythm: Normal sinus rhythm (01/27 1730) Resp:  [12-23] 16 (01/27 1945) BP: (94-130)/(61-80) 106/68 (01/27 1945) SpO2:  [97 %-100 %] 98 % (01/27 1703) Arterial Line BP: (113-166)/(49-68) 127/52 (01/27 1945)  No results for input(s): GLUCAP in the last 168 hours. Recent Labs  Lab 01/14/22 1342 01/14/22 1349 01/15/22 1514 01/16/22 0355 01/18/22 0329  NA 136 135  --  133* 135  K 4.5 4.5  --  5.2* 4.7  CL 105 104  --  104 106  CO2 23  --   --  19* 21*  GLUCOSE 114* 110*  --  88 95  BUN 32* 30*  --  38* 27*  CREATININE 3.68* 3.90*  --  4.73* 4.72*  CALCIUM 8.3*  --   --  8.5* 8.6*  MG  --   --  1.9  --   --   PHOS  --   --  5.7*  --  5.0*   Recent Labs  Lab 01/14/22 1342 01/15/22 1514 01/18/22 0329  AST 14* 11*  --   ALT 12 11  --   ALKPHOS 86 72  --   BILITOT 0.6 0.3  --   PROT 5.9* 5.0*  --   ALBUMIN 3.2* 2.7* 2.9*   Recent Labs  Lab 01/14/22 1342 01/14/22 1349 01/16/22 0355 01/18/22 0329  WBC 6.6  --  6.7 7.1  NEUTROABS 4.6  --   --   --   HGB 9.4* 9.2* 8.5* 8.2*  HCT 28.7* 27.0* 25.7* 24.8*  MCV 91.4  --  91.5 90.5  PLT 205  --  219 223   No results for input(s): CKTOTAL, CKMB, CKMBINDEX, TROPONINI in the last 168 hours. Recent Labs    01/16/22 0355  LABPROT 13.4  INR 1.0   No results for input(s): COLORURINE, LABSPEC, PHURINE, GLUCOSEU, HGBUR, BILIRUBINUR,  KETONESUR, PROTEINUR, UROBILINOGEN, NITRITE, LEUKOCYTESUR in the last 72 hours.  Invalid input(s): APPERANCEUR      Component Value Date/Time   CHOL 239 (H) 01/15/2022 1514   TRIG 535 (H) 01/15/2022 1514   HDL 28 (L) 01/15/2022 1514   CHOLHDL 8.5 01/15/2022 1514   VLDL UNABLE TO CALCULATE IF TRIGLYCERIDE OVER 400 mg/dL 01/15/2022 1514   LDLCALC UNABLE TO CALCULATE IF TRIGLYCERIDE OVER 400 mg/dL 01/15/2022 1514   Lab Results  Component Value Date   HGBA1C 5.6 01/16/2022      Component Value Date/Time   LABOPIA NONE DETECTED 01/14/2022 1823   COCAINSCRNUR NONE DETECTED 01/14/2022 1823   LABBENZ POSITIVE (A) 01/14/2022 1823   AMPHETMU NONE DETECTED 01/14/2022 1823   THCU NONE DETECTED 01/14/2022 1823   LABBARB NONE DETECTED 01/14/2022 1823    Recent Labs  Lab 01/14/22 1342  ETH <10    I have personally reviewed the radiological images below and agree with the radiology interpretations.  CT Angio Head W/Cm &/Or Wo Cm  Result Date: 01/14/2022 CLINICAL DATA:  Right hand weakness, right-sided facial droop and slurred speech EXAM: CT ANGIOGRAPHY HEAD AND NECK TECHNIQUE: Multidetector CT imaging of the head and neck was performed using the standard protocol during bolus administration of intravenous contrast. Multiplanar CT image reconstructions and MIPs were obtained to evaluate the vascular anatomy. Carotid stenosis measurements (when applicable) are obtained utilizing NASCET criteria, using the distal internal carotid diameter as the denominator. RADIATION DOSE REDUCTION: This exam was performed according to the departmental dose-optimization program which includes automated exposure control, adjustment of the mA and/or kV according to patient size and/or use of iterative reconstruction technique. CONTRAST:  174mL OMNIPAQUE IOHEXOL 350 MG/ML SOLN COMPARISON:  01/14/2022 CT head and MRI FINDINGS: CT HEAD FINDINGS Brain: No evidence of acute hemorrhage, cerebral edema, mass, mass effect,  or midline shift. No hydrocephalus or extra-axial fluid collection. Periventricular white matter changes, likely the sequela of chronic small vessel ischemic disease. The acute infarcts seen on the same-day MRI are not significantly apparent on this CT. Vascular: No hyperdense vessel. Skull: Normal. Negative for fracture or focal lesion. Sinuses/Orbits: No acute finding. Other: The mastoid air cells are well aerated. CTA NECK FINDINGS Aortic arch: Standard branching. Imaged portion shows no evidence of aneurysm or dissection. No significant stenosis of the major arch vessel origins. Right carotid system: No evidence of dissection, stenosis (50% or greater) or occlusion. Left carotid system: Evaluation of the origin of the left common carotid artery is somewhat limited due to beam hardening artifact from the adjacent venous bolus, however there appears to be mild narrowing at the origin. 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 No distal reconstitution of the extracranial ICA. Vertebral arteries: Moderate narrowing at the origin of the right vertebral artery, secondary to noncalcified plaque. Focal moderate stenosis of the right V2 (series 12, image 248) at the level of C6, as well as in the right C5-C6 foramen, secondary to degenerative changes (series 12, image 242). The right vertebral artery is otherwise patent. The left vertebral artery demonstrates mild calcified narrowing in the V2 segment, but is otherwise patent. Codominant. Skeleton: Multilevel degenerative changes in the cervical spine, with trace retrolisthesis of C5 on C6. No acute osseous abnormality. Other neck: Negative. Upper chest: Dependent atelectasis. Review of the MIP images confirms the above findings CTA HEAD FINDINGS Evaluation is somewhat limited by degree of venous contamination. Anterior circulation: The left intracranial internal carotid artery is non-opacified  until the ophthalmic and supraclinoid portions (series 12, image 111). Calcifications in the right intracranial internal carotid artery, which cause moderate narrowing in the distal cavernous segment and proximal supraclinoid segment. Occlusion of the left A1 (series 12, image 100), with a small area of filling in the distal left A1, likely retrograde. Patent right A1. Normal anterior communicating artery. Anterior cerebral arteries are patent to their distal aspects. No M1 stenosis or occlusion. Normal MCA bifurcations. Distal MCA branches perfused and symmetric. Posterior circulation: Vertebral arteries patent to the vertebrobasilar junction, with mild calcifications and irregularity in the right V4 segment, prior to the PICA takeoff (series 12, image 145), which causes mild-to-moderate narrowing. Mild to moderate narrowing in the left V4, proximal to the left PICA takeoff (series 12, image 144). Posterior inferior cerebral arteries patent bilaterally. Basilar patent to its distal aspect. Superior cerebellar arteries patent bilaterally. Bilateral P1 segments originate from the basilar artery. PCAs perfused to their distal aspects, with mild narrowing in the left P1/P2 and poor opacification of the distal left  P2 and P3 segments. The bilateral posterior communicating arteries are not definitively seen. Venous sinuses: As permitted by contrast timing, patent. Anatomic variants: None significant Review of the MIP images confirms the above findings IMPRESSION: 1. 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 The left intracranial internal carotid artery reconstitutes in the ophthalmic and supraclinoid portions. 2. Moderate narrowing in the distal right cavernous ICA and proximal supraclinoid segment. 3. Occlusion of the left A1.  The left ACA is otherwise patent. 4. Moderate stenosis at the origin of the right vertebral artery, with additional focal moderate stenosis of the  right V2 at the level of C6, both secondary to atherosclerotic plaque and degenerative changes. Mild-to-moderate stenosis of the bilateral V4 segments. 5. Poor opacification of the distal left P2 and P3 segments. These results were called by telephone at the time of interpretation on 01/14/2022 at 8:48 pm to provider DR. DOUTOVA , who verbally acknowledged these results. Electronically Signed   By: Merilyn Baba M.D.   On: 01/14/2022 20:48   DG Chest 2 View  Result Date: 01/14/2022 CLINICAL DATA:  Facial droop EXAM: CHEST - 2 VIEW COMPARISON:  09/06/2021 FINDINGS: Right dialysis catheter remains in place, unchanged. Heart and mediastinal contours are within normal limits. No focal opacities or effusions. No acute bony abnormality. Old healed left rib fractures. IMPRESSION: No active cardiopulmonary disease. Electronically Signed   By: Rolm Baptise M.D.   On: 01/14/2022 19:34   CT HEAD WO CONTRAST  Result Date: 01/14/2022 CLINICAL DATA:  Facial droop and right arm weakness and numbness for the past 2 days. EXAM: CT HEAD WITHOUT CONTRAST TECHNIQUE: Contiguous axial images were obtained from the base of the skull through the vertex without intravenous contrast. RADIATION DOSE REDUCTION: This exam was performed according to the departmental dose-optimization program which includes automated exposure control, adjustment of the mA and/or kV according to patient size and/or use of iterative reconstruction technique. COMPARISON:  None. FINDINGS: Brain: Normal size and position of the ventricles. Minimal patchy white matter low density in both cerebral hemispheres. No intracranial hemorrhage, mass lesion or CT evidence of acute infarction. Vascular: No hyperdense vessel or unexpected calcification. Skull: Normal. Negative for fracture or focal lesion. Sinuses/Orbits: Unremarkable. Other: None. IMPRESSION: 1. No acute abnormality. 2. Minimal chronic small vessel white matter ischemic changes in both cerebral  hemispheres. These make it difficult to exclude a small acute subcortical infarct. If this is a continued clinical concern, this could be further evaluated with an MRI of the brain. Electronically Signed   By: Claudie Revering M.D.   On: 01/14/2022 14:17   CT Angio Neck W and/or Wo Contrast  Result Date: 01/14/2022 CLINICAL DATA:  Right hand weakness, right-sided facial droop and slurred speech EXAM: CT ANGIOGRAPHY HEAD AND NECK TECHNIQUE: Multidetector CT imaging of the head and neck was performed using the standard protocol during bolus administration of intravenous contrast. Multiplanar CT image reconstructions and MIPs were obtained to evaluate the vascular anatomy. Carotid stenosis measurements (when applicable) are obtained utilizing NASCET criteria, using the distal internal carotid diameter as the denominator. RADIATION DOSE REDUCTION: This exam was performed according to the departmental dose-optimization program which includes automated exposure control, adjustment of the mA and/or kV according to patient size and/or use of iterative reconstruction technique. CONTRAST:  173mL OMNIPAQUE IOHEXOL 350 MG/ML SOLN COMPARISON:  01/14/2022 CT head and MRI FINDINGS: CT HEAD FINDINGS Brain: No evidence of acute hemorrhage, cerebral edema, mass, mass effect, or midline shift.  No hydrocephalus or extra-axial fluid collection. Periventricular white matter changes, likely the sequela of chronic small vessel ischemic disease. The acute infarcts seen on the same-day MRI are not significantly apparent on this CT. Vascular: No hyperdense vessel. Skull: Normal. Negative for fracture or focal lesion. Sinuses/Orbits: No acute finding. Other: The mastoid air cells are well aerated. CTA NECK FINDINGS Aortic arch: Standard branching. Imaged portion shows no evidence of aneurysm or dissection. No significant stenosis of the major arch vessel origins. Right carotid system: No evidence of dissection, stenosis (50% or greater) or  occlusion. Left carotid system: Evaluation of the origin of the left common carotid artery is somewhat limited due to beam hardening artifact from the adjacent venous bolus, however there appears to be mild narrowing at the origin. 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 No distal reconstitution of the extracranial ICA. Vertebral arteries: Moderate narrowing at the origin of the right vertebral artery, secondary to noncalcified plaque. Focal moderate stenosis of the right V2 (series 12, image 248) at the level of C6, as well as in the right C5-C6 foramen, secondary to degenerative changes (series 12, image 242). The right vertebral artery is otherwise patent. The left vertebral artery demonstrates mild calcified narrowing in the V2 segment, but is otherwise patent. Codominant. Skeleton: Multilevel degenerative changes in the cervical spine, with trace retrolisthesis of C5 on C6. No acute osseous abnormality. Other neck: Negative. Upper chest: Dependent atelectasis. Review of the MIP images confirms the above findings CTA HEAD FINDINGS Evaluation is somewhat limited by degree of venous contamination. Anterior circulation: The left intracranial internal carotid artery is non-opacified until the ophthalmic and supraclinoid portions (series 12, image 111). Calcifications in the right intracranial internal carotid artery, which cause moderate narrowing in the distal cavernous segment and proximal supraclinoid segment. Occlusion of the left A1 (series 12, image 100), with a small area of filling in the distal left A1, likely retrograde. Patent right A1. Normal anterior communicating artery. Anterior cerebral arteries are patent to their distal aspects. No M1 stenosis or occlusion. Normal MCA bifurcations. Distal MCA branches perfused and symmetric. Posterior circulation: Vertebral arteries patent to the vertebrobasilar junction, with mild  calcifications and irregularity in the right V4 segment, prior to the PICA takeoff (series 12, image 145), which causes mild-to-moderate narrowing. Mild to moderate narrowing in the left V4, proximal to the left PICA takeoff (series 12, image 144). Posterior inferior cerebral arteries patent bilaterally. Basilar patent to its distal aspect. Superior cerebellar arteries patent bilaterally. Bilateral P1 segments originate from the basilar artery. PCAs perfused to their distal aspects, with mild narrowing in the left P1/P2 and poor opacification of the distal left P2 and P3 segments. The bilateral posterior communicating arteries are not definitively seen. Venous sinuses: As permitted by contrast timing, patent. Anatomic variants: None significant Review of the MIP images confirms the above findings IMPRESSION: 1. 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 The left intracranial internal carotid artery reconstitutes in the ophthalmic and supraclinoid portions. 2. Moderate narrowing in the distal right cavernous ICA and proximal supraclinoid segment. 3. Occlusion of the left A1.  The left ACA is otherwise patent. 4. Moderate stenosis at the origin of the right vertebral artery, with additional focal moderate stenosis of the right V2 at the level of C6, both secondary to atherosclerotic plaque and degenerative changes. Mild-to-moderate stenosis of the bilateral V4 segments. 5. Poor opacification of the distal left  P2 and P3 segments. These results were called by telephone at the time of interpretation on 01/14/2022 at 8:48 pm to provider DR. DOUTOVA , who verbally acknowledged these results. Electronically Signed   By: Merilyn Baba M.D.   On: 01/14/2022 20:48   MR BRAIN WO CONTRAST  Result Date: 01/14/2022 CLINICAL DATA:  Neuro deficit, acute, stroke suspected. EXAM: MRI HEAD WITHOUT CONTRAST TECHNIQUE: Multiplanar, multiecho pulse sequences of the brain and surrounding  structures were obtained without intravenous contrast. COMPARISON:  Head CT same day FINDINGS: Brain: Diffusion imaging shows multiple small acute infarctions within the left hemisphere scattered within the MCA territory which could either be due to micro embolic infarctions or watershed infarctions. Micro embolic infarctions are favored. No large confluent infarction. No mass effect or swelling. Elsewhere, the brainstem and cerebellum are normal. Cerebral hemispheres show mild to moderate chronic small-vessel ischemic changes of the white matter. No mass lesion, hemorrhage, hydrocephalus or extra-axial collection. Vascular: Major vessels at the base of the brain show flow. 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 This pattern could be due to micro embolic infarction or watershed infarction. No large confluent infarction. No mass effect or hemorrhage. Moderate chronic small-vessel ischemic changes elsewhere affecting the cerebral hemispheric white matter. Electronically Signed   By: Nelson Chimes M.D.   On: 01/14/2022 17:58   IR US Guide Vasc Access Right  Result Date: 01/17/2022 CLINICAL DATA:  History of transient ischemic attack comprising of right hand weakness with slurred speech evaluation of the brain revealed numerous diffusion-weighted abnormalities in the left middle cerebral artery distribution. CT of the head and neck revealed left internal carotid artery proximal occlusion and questionable string sign. EXAM: BILATERAL COMMON CAROTID AND INNOMINATE ANGIOGRAPHY COMPARISON:  CT angiogram of the head and neck of January 14, 2022. MEDICATIONS: Heparin 2000 units IV. No antibiotic was administered within 1 hour of the procedure. ANESTHESIA/SEDATION: Versed 1 mg IV; Fentanyl 25 mcg IV Moderate Sedation Time:  35 minutes  The patient was continuously monitored during the procedure by the interventional radiology nurse under my direct supervision. CONTRAST:  Omnipaque 300 approximately 65 mL. FLUOROSCOPY TIME:  Fluoroscopy Time: 13 minutes 6 seconds (1170 mGy). COMPLICATIONS: None immediate. TECHNIQUE: Informed written consent was obtained from the patient after a thorough discussion of the procedural risks, benefits and alternatives. All questions were addressed. Maximal Sterile Barrier Technique was utilized including caps, mask, sterile gowns, sterile gloves, sterile drape, hand hygiene and skin antiseptic. A timeout was performed prior to the initiation of the procedure. The right forearm to the wrist was prepped and draped in usual sterile manner. The right radial artery was then identified with ultrasound, and its morphology documented permanently in the radiology PACS system. A dorsal palmar anastomosis was verified to be present. Using ultrasound guidance and micropuncture set access into the right radial artery was obtained with a 4/5 French radial sheath over a 0.018 inch micro guidewire. The guidewire and obturator were removed. Good aspiration was obtained from the side port of the radial sheath. A cocktail of 2000 units of heparin, 2.5 mg of verapamil, and 200 mcg of nitroglycerin was then infused in diluted form through sheath without event. A right radial arteriogram was performed. Over a 0.035 inch Roadrunner guidewire, a 5 [place] Simmons 2 diagnostic catheter was then advanced to the aortic arch region, selectively positioned in the origin of the  right vertebral artery, the right common carotid artery, the left common carotid artery and the left vertebral artery. A wrist band was then applied for hemostasis at the right radial puncture site. Distal right radial pulse was verified to be present. FINDINGS: The right vertebral artery origin demonstrates a severe stenosis. More distally, the vessel is seen to opacify to  the cranial skull base. There is approximately 50% stenosis of the right vertebrobasilar junction just proximal to the origin of the right posterior-inferior cerebellar artery. Distal to this contrast is seen in the basilar artery and the posterior cerebral arteries. Mixed with unopacified blood is seen in the basilar artery from the contralateral vertebral artery. The right common carotid arteriogram demonstrates the right external carotid artery and its major branches to be widely patent. The right internal carotid artery has an eccentric smooth atherosclerotic plaque along the lateral aspect just distal to the bulb with approximately 20% stenosis by the NASCET criteria. More distally, the vessel is seen to opacify to the cranial skull base. Mild caliber irregularity probably related to arteriosclerosis is seen of the cavernous segment of the right internal carotid artery. The right middle cerebral artery and the right anterior cerebral artery opacify into the capillary and venous phases. Prompt opacification of the left anterior cerebral A2 segment and distally is seen via anterior communicating artery. A small infundibulum is evident in the right posterior communicating artery. The left common carotid arteriogram demonstrates the left external carotid artery and its major branches to be patent. Left internal carotid artery at the bulb and its proximal 1/3 demonstrates slow ascent of contrast to near complete occlusion. The delayed images demonstrated ascent of contrast to the distal cervical left ICA. More distally, reconstitution of the left internal carotid artery and the supraclinoid segment is noted from left external carotid artery branches via the ipsilateral ophthalmic artery. Opacification of the right left middle cerebral artery distribution is evident. No opacification of the left anterior cerebral artery is evident. The left vertebral artery origin is widely patent. The vessel is seen to opacify to  the cranial skull base. Patency is seen of the left vertebrobasilar junction with a 50%-60% stenosis of the left vertebrobasilar junction just proximal to the origin of the left posterior-inferior cerebellar artery. Mild caliber irregularity of the proximal PICA suggests intracranial arteriosclerosis. More distally, opacification is seen of the basilar artery, the posterior cerebral arteries, the superior cerebellar arteries and the anterior-inferior cerebellar arteries into the capillary and venous phases. Unopacified blood is seen in the basilar artery from the contralateral vertebral artery. 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 Partial reconstitution of the left internal carotid artery supraclinoid segment from the ipsilateral ophthalmic artery. Non opacification of the left anterior cerebral artery A1 segment. 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 Approximately 50% to 60% stenosis of the dominant left vertebrobasilar junction just proximal to the left posterior-inferior cerebellar artery. Approximately 50% stenosis of the right vertebrobasilar junction proximal to the right posteroinferior cerebellar artery. PLAN: Finding discussed with the patient and the referring neurologist. Electronically Signed   By: Luanne Bras M.D.   On: 01/17/2022 08:16   DG Chest Port 1 View  Result Date: 01/15/2022 CLINICAL DATA:  Weakness. EXAM: PORTABLE CHEST 1 VIEW COMPARISON:  09/06/2021 FINDINGS: Dialysis catheter with tip at the upper cavoatrial junction. There is no edema, consolidation, effusion, or pneumothorax. Artifact  from EKG leads. Normal heart size and mediastinal contours. IMPRESSION: No evidence of active disease. Electronically Signed   By: Jorje Guild M.D.   On: 01/15/2022 07:43   ECHOCARDIOGRAM COMPLETE  Result Date: 01/16/2022     ECHOCARDIOGRAM REPORT   Patient Name:   BRYTEN MAHER Date of Exam: 01/16/2022 Medical Rec #:  [id number]    Height:       70.0 in Accession #:    [id number]   Weight:       163.1 lb Date of Birth:  [birth year]     BSA:          1.914 m Patient Age:    20 years     BP:           128/79 mmHg Patient Gender: M            HR:           83 bpm. Exam Location:  Inpatient Procedure: 2D Echo Indications:    Stroke  History:        Patient has prior history of Echocardiogram examinations, most                 recent 09/08/2021.  Sonographer:    [name] Referring Phys: [place]  1. Left ventricular ejection fraction, by estimation, is 55 to 60%. The left ventricle has normal function. The left ventricle has no regional wall motion abnormalities. There is mild left ventricular hypertrophy. Left ventricular diastolic parameters are consistent with Grade I diastolic dysfunction (impaired relaxation).  2. Right ventricular systolic function is normal. The right ventricular size is normal. Tricuspid regurgitation signal is inadequate for assessing PA pressure.  3. The mitral valve is normal in structure. Trivial mitral valve regurgitation. No evidence of mitral stenosis.  4. The aortic valve is tricuspid. Aortic valve regurgitation is not visualized. Aortic valve sclerosis/calcification is present, without any evidence of aortic stenosis.  5. Aortic dilatation noted. There is mild dilatation of the ascending aorta, measuring 38 mm.  6. The inferior vena cava is normal in size with greater than 50% respiratory variability, suggesting right atrial pressure of 3 mmHg.  7. A small pericardial effusion is present. FINDINGS  Left Ventricle: Left ventricular ejection fraction, by estimation, is 55 to 60%. The left ventricle has normal function. The left ventricle has no regional wall motion abnormalities. The left ventricular internal cavity size was normal in size. There is  mild left ventricular hypertrophy.  Left ventricular diastolic parameters are consistent with Grade I diastolic dysfunction (impaired relaxation). Right Ventricle: The right ventricular size is normal. No increase in right ventricular wall thickness. Right ventricular systolic function is normal. Tricuspid regurgitation signal is inadequate for assessing PA pressure. Left Atrium: Left atrial size was normal in size. Right Atrium: Right atrial size was normal in size. Pericardium: A small pericardial effusion is present. Mitral Valve: The mitral valve is normal in structure. There is mild calcification of the mitral valve leaflet(s). Mild mitral annular calcification. Trivial mitral valve regurgitation. No evidence of mitral valve stenosis. Tricuspid Valve: The tricuspid valve is normal in structure. Tricuspid valve regurgitation is not demonstrated. Aortic Valve: The aortic valve is tricuspid. Aortic valve regurgitation is not visualized. Aortic valve sclerosis/calcification is present, without any evidence of aortic stenosis. Aortic valve mean gradient measures 5.0 mmHg. Aortic valve peak gradient measures 9.0 mmHg. Aortic valve area, by VTI measures 1.94 cm. Pulmonic Valve: The pulmonic valve was normal in structure. Pulmonic valve regurgitation  is not visualized. Aorta: The aortic root is normal in size and structure and aortic dilatation noted. There is mild dilatation of the ascending aorta, measuring 38 mm. Venous: The inferior vena cava is normal in size with greater than 50% respiratory variability, suggesting right atrial pressure of 3 mmHg. IAS/Shunts: No atrial level shunt detected by color flow Doppler.  LEFT VENTRICLE PLAX 2D LVIDd:         5.00 cm   Diastology LVIDs:         3.80 cm   LV e' medial:    5.77 cm/s LV PW:         1.20 cm   LV E/e' medial:  8.4 LV IVS:        1.20 cm   LV e' lateral:   12.10 cm/s LVOT diam:     2.00 cm   LV E/e' lateral: 4.0 LV SV:         42 LV SV Index:   22 LVOT Area:     3.14 cm  RIGHT VENTRICLE RV  Basal diam:  3.30 cm RV Mid diam:    2.50 cm RV S prime:     13.60 cm/s TAPSE (M-mode): 1.7 cm LEFT ATRIUM             Index        RIGHT ATRIUM           Index LA diam:        3.30 cm 1.72 cm/m   RA Area:     13.50 cm LA Vol (A2C):   42.2 ml 22.05 ml/m  RA Volume:   31.50 ml  16.46 ml/m LA Vol (A4C):   49.4 ml 25.81 ml/m LA Biplane Vol: 46.0 ml 24.03 ml/m  AORTIC VALVE AV Area (Vmax):    2.08 cm AV Area (Vmean):   1.97 cm AV Area (VTI):     1.94 cm AV Vmax:           150.00 cm/s AV Vmean:          99.300 cm/s AV VTI:            0.219 m AV Peak Grad:      9.0 mmHg AV Mean Grad:      5.0 mmHg LVOT Vmax:         99.25 cm/s LVOT Vmean:        62.150 cm/s LVOT VTI:          0.135 m LVOT/AV VTI ratio: 0.62  AORTA Ao Root diam: 3.40 cm Ao Asc diam:  3.80 cm MITRAL VALVE MV Area (PHT): 4.57 cm    SHUNTS MV Decel Time: 166 msec    Systemic VTI:  0.14 m MV E velocity: 48.40 cm/s  Systemic Diam: 2.00 cm MV A velocity: 67.70 cm/s MV E/A ratio:  0.71 Dalton McleanMD Electronically signed by Franki Monte Signature Date/Time: 01/16/2022/5:45:45 PM    Final    DG HIP UNILAT WITH PELVIS 2-3 VIEWS LEFT  Result Date: 01/16/2022 CLINICAL DATA:  Left hip pain.  No injury. EXAM: DG HIP (WITH OR WITHOUT PELVIS) 2-3V LEFT COMPARISON:  Hip x-ray 01/14/2022. FINDINGS: There is no evidence of hip fracture or dislocation. There is no evidence of arthropathy or other focal bone abnormality. IMPRESSION: Negative. Electronically Signed   By: Ronney Asters M.D.   On: 01/16/2022 19:24   DG HIPS BILAT WITH PELVIS MIN 5 VIEWS  Result Date: 01/14/2022 CLINICAL DATA:  Bilateral hip pain EXAM: DG  HIP (WITH OR WITHOUT PELVIS) 5+V BILAT COMPARISON:  None. FINDINGS: There is no evidence of hip fracture or dislocation. There is no evidence of arthropathy or other focal bone abnormality. IMPRESSION: Negative. Electronically Signed   By: Fidela Salisbury M.D.   On: 01/14/2022 21:57   IR ANGIO INTRA EXTRACRAN SEL COM CAROTID INNOMINATE  BILAT MOD SED  Result Date: 01/17/2022 CLINICAL DATA:  History of transient ischemic attack comprising of right hand weakness with slurred speech evaluation of the brain revealed numerous diffusion-weighted abnormalities in the left middle cerebral artery distribution. CT of the head and neck revealed left internal carotid artery proximal occlusion and questionable string sign. EXAM: BILATERAL COMMON CAROTID AND INNOMINATE ANGIOGRAPHY COMPARISON:  CT angiogram of the head and neck of January 14, 2022. MEDICATIONS: Heparin 2000 units IV. No antibiotic was administered within 1 hour of the procedure. ANESTHESIA/SEDATION: Versed 1 mg IV; Fentanyl 25 mcg IV Moderate Sedation Time:  35 minutes The patient was continuously monitored during the procedure by the interventional radiology nurse under my direct supervision. CONTRAST:  Omnipaque 300 approximately 65 mL. FLUOROSCOPY TIME:  Fluoroscopy Time: 13 minutes 6 seconds (1170 mGy). COMPLICATIONS: None immediate. TECHNIQUE: Informed written consent was obtained from the patient after a thorough discussion of the procedural risks, benefits and alternatives. All questions were addressed. Maximal Sterile Barrier Technique was utilized including caps, mask, sterile gowns, sterile gloves, sterile drape, hand hygiene and skin antiseptic. A timeout was performed prior to the initiation of the procedure. The right forearm to the wrist was prepped and draped in usual sterile manner. The right radial artery was then identified with ultrasound, and its morphology documented permanently in the radiology PACS system. A dorsal palmar anastomosis was verified to be present. Using ultrasound guidance and micropuncture set access into the right radial artery was obtained with a 4/5 French radial sheath over a 0.018 inch micro guidewire. The guidewire and obturator were removed. Good aspiration was obtained from the side port of the radial sheath. A cocktail of 2000 units of heparin, 2.5  mg of verapamil, and 200 mcg of nitroglycerin was then infused in diluted form through sheath without event. A right radial arteriogram was performed. Over a 0.035 inch Roadrunner guidewire, a 5 [place] Simmons 2 diagnostic catheter was then advanced to the aortic arch region, selectively positioned in the origin of the right vertebral artery, the right common carotid artery, the left common carotid artery and the left vertebral artery. A wrist band was then applied for hemostasis at the right radial puncture site. Distal right radial pulse was verified to be present. FINDINGS: The right vertebral artery origin demonstrates a severe stenosis. More distally, the vessel is seen to opacify to the cranial skull base. There is approximately 50% stenosis of the right vertebrobasilar junction just proximal to the origin of the right posterior-inferior cerebellar artery. Distal to this contrast is seen in the basilar artery and the posterior cerebral arteries. Mixed with unopacified blood is seen in the basilar artery from the contralateral vertebral artery. The right common carotid arteriogram demonstrates the right external carotid artery and its major branches to be widely patent. The right internal carotid artery has an eccentric smooth atherosclerotic plaque along the lateral aspect just distal to the bulb with approximately 20% stenosis by the NASCET criteria. More distally, the vessel is seen to opacify to the cranial skull base. Mild caliber irregularity probably related to arteriosclerosis is seen of the cavernous segment of the right internal carotid artery. The right middle  cerebral artery and the right anterior cerebral artery opacify into the capillary and venous phases. Prompt opacification of the left anterior cerebral A2 segment and distally is seen via anterior communicating artery. A small infundibulum is evident in the right posterior communicating artery. The left common carotid arteriogram demonstrates  the left external carotid artery and its major branches to be patent. Left internal carotid artery at the bulb and its proximal 1/3 demonstrates slow ascent of contrast to near complete occlusion. The delayed images demonstrated ascent of contrast to the distal cervical left ICA. More distally, reconstitution of the left internal carotid artery and the supraclinoid segment is noted from left external carotid artery branches via the ipsilateral ophthalmic artery. Opacification of the right left middle cerebral artery distribution is evident. No opacification of the left anterior cerebral artery is evident. The left vertebral artery origin is widely patent. The vessel is seen to opacify to the cranial skull base. Patency is seen of the left vertebrobasilar junction with a 50%-60% stenosis of the left vertebrobasilar junction just proximal to the origin of the left posterior-inferior cerebellar artery. Mild caliber irregularity of the proximal PICA suggests intracranial arteriosclerosis. More distally, opacification is seen of the basilar artery, the posterior cerebral arteries, the superior cerebellar arteries and the anterior-inferior cerebellar arteries into the capillary and venous phases. Unopacified blood is seen in the basilar artery from the contralateral vertebral artery. 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 Partial reconstitution of the left internal carotid artery supraclinoid segment from the ipsilateral ophthalmic artery. Non opacification of the left anterior cerebral artery A1 segment. 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 Approximately 50% to 60% stenosis of the dominant left vertebrobasilar junction just proximal to the left posterior-inferior cerebellar artery. Approximately 50% stenosis of the right vertebrobasilar junction proximal to the right  posteroinferior cerebellar artery. PLAN: Finding discussed with the patient and the referring neurologist. Electronically Signed   By: Luanne Bras M.D.   On: 01/17/2022 08:16   IR ANGIO VERTEBRAL SEL SUBCLAVIAN INNOMINATE BILAT MOD SED  Result Date: 01/17/2022 CLINICAL DATA:  History of transient ischemic attack comprising of right hand weakness with slurred speech evaluation of the brain revealed numerous diffusion-weighted abnormalities in the left middle cerebral artery distribution. CT of the head and neck revealed left internal carotid artery proximal occlusion and questionable string sign. EXAM: BILATERAL COMMON CAROTID AND INNOMINATE ANGIOGRAPHY COMPARISON:  CT angiogram of the head and neck of January 14, 2022. MEDICATIONS: Heparin 2000 units IV. No antibiotic was administered within 1 hour of the procedure. ANESTHESIA/SEDATION: Versed 1 mg IV; Fentanyl 25 mcg IV Moderate Sedation Time:  35 minutes The patient was continuously monitored during the procedure by the interventional radiology nurse under my direct supervision. CONTRAST:  Omnipaque 300 approximately 65 mL. FLUOROSCOPY TIME:  Fluoroscopy Time: 13 minutes 6 seconds (1170 mGy). COMPLICATIONS: None immediate. TECHNIQUE: Informed written consent was obtained from the patient after a thorough discussion of the procedural risks, benefits and alternatives. All questions were addressed. Maximal Sterile Barrier Technique was utilized including caps, mask, sterile gowns, sterile gloves, sterile drape, hand hygiene and skin antiseptic. A timeout was performed prior to the initiation of the procedure. The right forearm to the wrist was prepped and draped in usual sterile manner. The right radial artery was then identified with ultrasound, and its morphology documented permanently in the radiology PACS system.  A dorsal palmar anastomosis was verified to be present. Using ultrasound guidance and micropuncture set access into the right radial artery  was obtained with a 4/5 French radial sheath over a 0.018 inch micro guidewire. The guidewire and obturator were removed. Good aspiration was obtained from the side port of the radial sheath. A cocktail of 2000 units of heparin, 2.5 mg of verapamil, and 200 mcg of nitroglycerin was then infused in diluted form through sheath without event. A right radial arteriogram was performed. Over a 0.035 inch Roadrunner guidewire, a 5 [place] Simmons 2 diagnostic catheter was then advanced to the aortic arch region, selectively positioned in the origin of the right vertebral artery, the right common carotid artery, the left common carotid artery and the left vertebral artery. A wrist band was then applied for hemostasis at the right radial puncture site. Distal right radial pulse was verified to be present. FINDINGS: The right vertebral artery origin demonstrates a severe stenosis. More distally, the vessel is seen to opacify to the cranial skull base. There is approximately 50% stenosis of the right vertebrobasilar junction just proximal to the origin of the right posterior-inferior cerebellar artery. Distal to this contrast is seen in the basilar artery and the posterior cerebral arteries. Mixed with unopacified blood is seen in the basilar artery from the contralateral vertebral artery. The right common carotid arteriogram demonstrates the right external carotid artery and its major branches to be widely patent. The right internal carotid artery has an eccentric smooth atherosclerotic plaque along the lateral aspect just distal to the bulb with approximately 20% stenosis by the NASCET criteria. More distally, the vessel is seen to opacify to the cranial skull base. Mild caliber irregularity probably related to arteriosclerosis is seen of the cavernous segment of the right internal carotid artery. The right middle cerebral artery and the right anterior cerebral artery opacify into the capillary and venous phases. Prompt  opacification of the left anterior cerebral A2 segment and distally is seen via anterior communicating artery. A small infundibulum is evident in the right posterior communicating artery. The left common carotid arteriogram demonstrates the left external carotid artery and its major branches to be patent. Left internal carotid artery at the bulb and its proximal 1/3 demonstrates slow ascent of contrast to near complete occlusion. The delayed images demonstrated ascent of contrast to the distal cervical left ICA. More distally, reconstitution of the left internal carotid artery and the supraclinoid segment is noted from left external carotid artery branches via the ipsilateral ophthalmic artery. Opacification of the right left middle cerebral artery distribution is evident. No opacification of the left anterior cerebral artery is evident. The left vertebral artery origin is widely patent. The vessel is seen to opacify to the cranial skull base. Patency is seen of the left vertebrobasilar junction with a 50%-60% stenosis of the left vertebrobasilar junction just proximal to the origin of the left posterior-inferior cerebellar artery. Mild caliber irregularity of the proximal PICA suggests intracranial arteriosclerosis. More distally, opacification is seen of the basilar artery, the posterior cerebral arteries, the superior cerebellar arteries and the anterior-inferior cerebellar arteries into the capillary and venous phases. Unopacified blood is seen in the basilar artery from the contralateral vertebral artery. 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 Partial reconstitution of the left internal carotid artery supraclinoid segment from the ipsilateral ophthalmic artery. Non opacification of the left anterior cerebral  artery A1 segment. Severe stenosis at the origin of the right vertebral  artery. Approximately 50% to 60% stenosis of the dominant left vertebrobasilar junction just proximal to the left posterior-inferior cerebellar artery. Approximately 50% stenosis of the right vertebrobasilar junction proximal to the right posteroinferior cerebellar artery. PLAN: Finding discussed with the patient and the referring neurologist. Electronically Signed   By: Luanne Bras M.D.   On: 01/17/2022 08:16      PHYSICAL EXAM  Temp:  [97 F (36.1 C)-98 F (36.7 C)] 97.9 F (36.6 C) (01/27 1703) Pulse Rate:  [72-92] 92 (01/27 1703) Resp:  [12-23] 16 (01/27 1945) BP: (94-130)/(61-80) 106/68 (01/27 1945) SpO2:  [97 %-100 %] 98 % (01/27 1703) Arterial Line BP: (113-166)/(49-68) 127/52 (01/27 1945)  General - Well nourished, well developed, in no apparent distress.  Ophthalmologic - fundi not visualized due to noncooperation.  Cardiovascular - Regular rhythm and rate.  Mental Status -  Level of arousal and orientation to time, place, and person were intact. Language including expression, naming, repetition, comprehension was assessed and found intact. Fund of Knowledge was assessed and was intact.  Cranial Nerves II - XII - II - Visual field intact OU. III, IV, VI - Extraocular movements intact. V - Facial sensation intact bilaterally. VII - Facial movement intact bilaterally. VIII - Hearing & vestibular intact bilaterally. X - Palate elevates symmetrically. XI - Chin turning & shoulder shrug intact bilaterally. XII - Tongue protrusion intact.  Motor Strength - The patients strength was normal in all extremities and pronator drift was absent.  Bulk was normal and fasciculations were absent.   Motor Tone - Muscle tone was assessed at the neck and appendages and was normal.  Reflexes - The patients reflexes were symmetrical in all extremities and he had no pathological reflexes.  Sensory - Light touch, temperature/pinprick were assessed and were symmetrical.     Coordination - The patient had normal movements in the hands and feet with no ataxia or dysmetria.  Tremor was absent.  Gait and Station - deferred.   ASSESSMENT/PLAN [name] is a 71 y.o. 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 No tPA given due to OOW.    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  Left A1 occlusion. MRI left MCA scattered infarcts Cerebral angiogram Occluded left ICA prox with a delayed  string sign. Partial reconstitution of left ICA supraclinoid from left ECA branches via left Ophthalmic A.  Severe proximal right VA origin stenosis, 50% bilateral VB junction stenosis. 2D Echo EF 55 to 60% LDL 85.5, TG 535 HgbA1c 5.6 SCDs for VTE prophylaxis No antithrombotic prior to admission, now on aspirin 81 and Brilinta 90 twice daily for 30 days and then aspirin alone. Patient counseled to be compliant with his antithrombotic medications Ongoing aggressive stroke risk factor management Therapy recommendations: None Disposition: Pending  Carotid stenosis/occlusion CTA head and neck left ICA occlusion with reconstituted at terminal, moderate stenosis right ICA siphon, Cerebral angiogram Occluded left ICA prox with a delayed  string sign. Partial reconstitution of left ICA supraclinoid from left ECA branches via left Ophthalmic A.   Endovascular procedure today with Dr. Estanislado Pandy - s/p Complete revascularization achieved of the ICA to the ophthalmic seg  with x 1 pass with solitaire x32mmx 40 mm retriever and aspiration and x 2 passes with embotrap 6.3 mmx 57 mm. 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 Continue aspirin 81 and Brilinta for 30 days and then aspirin alone.  AL amyloidosis Follows with Dr. Burr Medico at cancer center On chemotherapy Has renal, heart and neuro involvement ESRD on hemodialysis Orthostatic hypotension on midodrine Hx of CHF and mild cardiomyopathy EKG showed frequent PACs and tele showed frequent ectopy, no afib found  BP management Stable Avoid low BP Long term BP goal 130-150 given left ICA occlusion   Hyperlipidemia Home meds: None LDL 85.5, goal < 70 Now on Lipitor 40 Continue statin at discharge  Tobacco abuse Current smoker Smoking cessation counseling provided Pt will consider to quit  Other Stroke Risk Factors Advanced age  Other Active Problems Orthostatic hypotension on midodrine ESRD on HD Anemia of chronic disease, hemoglobin 9.4->8.2  Hospital day # 4   Caleb Hawking, Caleb Simmons Stroke Neurology 01/18/2022 8:06 PM    To contact Stroke Continuity provider, please refer to [email or web address] After hours, contact General Neurology

## 2022-01-18 NOTE — Progress Notes (Signed)
[facility] Progress Note  Subjective: alert and no c/o's. Will be going for procedure this afternoon  Vitals:   01/17/22 2356 01/18/22 0355 01/18/22 0734 01/18/22 1108  BP: 130/75 127/71 128/78 126/79  Pulse: 74 72 80 75  Resp: 12 16 16 16   Temp: 98 F (36.7 C) 97.6 F (36.4 C) 98 F (36.7 C) 98 F (36.7 C)  TempSrc: Oral Oral Oral Oral  SpO2: 99% 98% 97% 100%  Weight:        Exam:  alert, nad   no jvd  Chest cta bilat  Cor reg no RG  Abd soft ntnd no ascites   Ext no LE edema   Alert, nonfocal, Ox 3  RIJ TDC intact      Home meds include - xanax prn, midodrine 5 tid, remeron, oxy IR prn, protonix, renvela 800 tid, flomax, ambien prn        OP HD: [facility] MWF   4h  400/1.5  76.5kg  2/2 bath  RIJ TDC  Hep none   - venofer 100 x 10 (9 left)   - calcitriol 0.25 ug po tiw      Alb 3.2  Ca 8.3  tsat 47%     Hb 9.2       CXR 1/24 - IMPRESSION: No evidence of active disease.       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 Going for cerebral angioplasty w/ possible stent placement today. Per Neuro/pmd.  ESRD - on HD MWF. Will postpone today's HD to tomorrow.  HTN/ vol - BP's not high, cont midodrine 5 tid. No vol excess. 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 Tsat good at 47%.  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         Rob Doctor, hospital 01/18/2022, 3:31 PM   Recent Labs  Lab 01/15/22 1514 01/16/22 0355 01/18/22 0329  K  --  5.2* 4.7  BUN  --  38* 27*  CREATININE  --  4.73* 4.72*  CALCIUM  --  8.5* 8.6*  PHOS 5.7*  --  5.0*  HGB  --  8.5* 8.2*    Inpatient medications:  [MAR Hold]  stroke: mapping our early stages of recovery book   Does not apply Once   [MAR Hold] aspirin EC  81 mg Oral Daily   [MAR Hold] atorvastatin  40 mg Oral QHS   [MAR Hold] calcitRIOL  0.25 mcg Oral Q  M,W,F-HD   [MAR Hold] Chlorhexidine Gluconate Cloth  6 each Topical P9292   [MAR Hold] folic acid  2 mg Oral q AM   [MAR Hold] midodrine  5 mg Oral TID WC   [MAR Hold] mirtazapine  30 mg Oral QHS   [MAR Hold] mupirocin ointment  1 application Nasal BID   [MAR Hold] pantoprazole  40 mg Oral BID AC   [MAR Hold] sevelamer carbonate  800 mg Oral TID WC   [MAR Hold] tamsulosin  0.4 mg Oral q AM   [MAR Hold] ticagrelor  90 mg Oral BID    lactated ringers     [MAR Hold] acetaminophen **OR** [MAR Hold] acetaminophen (TYLENOL) oral liquid 160 mg/5 mL **OR** [MAR Hold] acetaminophen, [MAR Hold] ALPRAZolam, [MAR Hold] ALPRAZolam, [MAR Hold]  HYDROmorphone (DILAUDID) injection, [MAR Hold] iohexol, iohexol, nitroGLYCERIN, [MAR Hold] oxyCODONE

## 2022-01-19 ENCOUNTER — Inpatient Hospital Stay (HOSPITAL_COMMUNITY): Payer: Commercial Managed Care - PPO

## 2022-01-19 DIAGNOSIS — I5042 Chronic combined systolic (congestive) and diastolic (congestive) heart failure: Secondary | ICD-10-CM

## 2022-01-19 DIAGNOSIS — I959 Hypotension, unspecified: Secondary | ICD-10-CM

## 2022-01-19 DIAGNOSIS — M1612 Unilateral primary osteoarthritis, left hip: Secondary | ICD-10-CM

## 2022-01-19 LAB — RENAL FUNCTION PANEL
Albumin: 2.7 g/dL — ABNORMAL LOW (ref 3.5–5.0)
Anion gap: 9 (ref 5–15)
BUN: 35 mg/dL — ABNORMAL HIGH (ref 8–23)
CO2: 18 mmol/L — ABNORMAL LOW (ref 22–32)
Calcium: 8.1 mg/dL — ABNORMAL LOW (ref 8.9–10.3)
Chloride: 108 mmol/L (ref 98–111)
Creatinine, Ser: 5.12 mg/dL — ABNORMAL HIGH (ref 0.61–1.24)
GFR, Estimated: 11 mL/min — ABNORMAL LOW (ref >60)
Glucose, Bld: 109 mg/dL — ABNORMAL HIGH (ref 70–99)
Phosphorus: 6 mg/dL — ABNORMAL HIGH (ref 2.5–4.6)
Potassium: 5.3 mmol/L — ABNORMAL HIGH (ref 3.5–5.1)
Sodium: 135 mmol/L (ref 135–145)

## 2022-01-19 LAB — CBC WITH DIFFERENTIAL/PLATELET
Abs Immature Granulocytes: 0.05 10*3/uL (ref 0.00–0.07)
Basophils Absolute: 0 10*3/uL (ref 0.0–0.1)
Basophils Relative: 0 %
Eosinophils Absolute: 0 10*3/uL (ref 0.0–0.5)
Eosinophils Relative: 0 %
HCT: 20.3 % — ABNORMAL LOW (ref 39.0–52.0)
Hemoglobin: 6.7 g/dL — CL (ref 13.0–17.0)
Immature Granulocytes: 1 %
Lymphocytes Relative: 10 %
Lymphs Abs: 0.6 10*3/uL — ABNORMAL LOW (ref 0.7–4.0)
MCH: 30.3 pg (ref 26.0–34.0)
MCHC: 33 g/dL (ref 30.0–36.0)
MCV: 91.9 fL (ref 80.0–100.0)
Monocytes Absolute: 0.4 10*3/uL (ref 0.1–1.0)
Monocytes Relative: 6 %
Neutro Abs: 5.5 10*3/uL (ref 1.7–7.7)
Neutrophils Relative %: 83 %
Platelets: 221 10*3/uL (ref 150–400)
RBC: 2.21 MIL/uL — ABNORMAL LOW (ref 4.22–5.81)
RDW: 17.6 % — ABNORMAL HIGH (ref 11.5–15.5)
WBC: 6.6 10*3/uL (ref 4.0–10.5)
nRBC: 0 % (ref 0.0–0.2)

## 2022-01-19 LAB — HEMOGLOBIN AND HEMATOCRIT, BLOOD
HCT: 19.7 % — ABNORMAL LOW (ref 39.0–52.0)
HCT: 24.3 % — ABNORMAL LOW (ref 39.0–52.0)
Hemoglobin: 6.6 g/dL — CL (ref 13.0–17.0)
Hemoglobin: 8.3 g/dL — ABNORMAL LOW (ref 13.0–17.0)

## 2022-01-19 LAB — BASIC METABOLIC PANEL
Anion gap: 8 (ref 5–15)
BUN: 34 mg/dL — ABNORMAL HIGH (ref 8–23)
CO2: 19 mmol/L — ABNORMAL LOW (ref 22–32)
Calcium: 8.2 mg/dL — ABNORMAL LOW (ref 8.9–10.3)
Chloride: 108 mmol/L (ref 98–111)
Creatinine, Ser: 5.13 mg/dL — ABNORMAL HIGH (ref 0.61–1.24)
GFR, Estimated: 11 mL/min — ABNORMAL LOW (ref >60)
Glucose, Bld: 108 mg/dL — ABNORMAL HIGH (ref 70–99)
Potassium: 5.3 mmol/L — ABNORMAL HIGH (ref 3.5–5.1)
Sodium: 135 mmol/L (ref 135–145)

## 2022-01-19 LAB — PREPARE RBC (CROSSMATCH)

## 2022-01-19 IMAGING — CT CT ABD-PELV W/O CM
2 of 4 series · 17 of 46 positions shown, 19 images · non-contrast
Comparison: [DATE]

CLINICAL DATA: Dropping hemoglobin, rule out retroperitoneal bleed



[Series 3: a/p w/o 5mm · axial · non-contrast · 0.85mm/px · z∈[-1285,-860]mm · 14 of 93 slices shown, 16 images]
[im 4/93  soft-tissue]
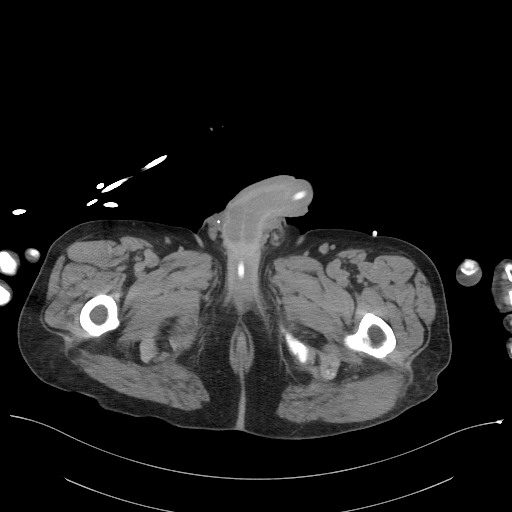
[im 4/93  bone]
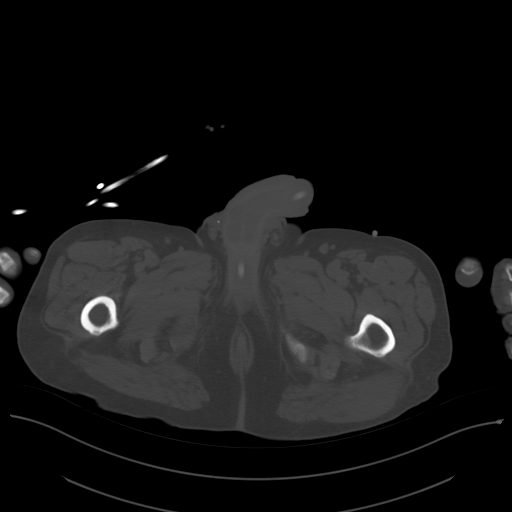
[im 12/93  soft-tissue]
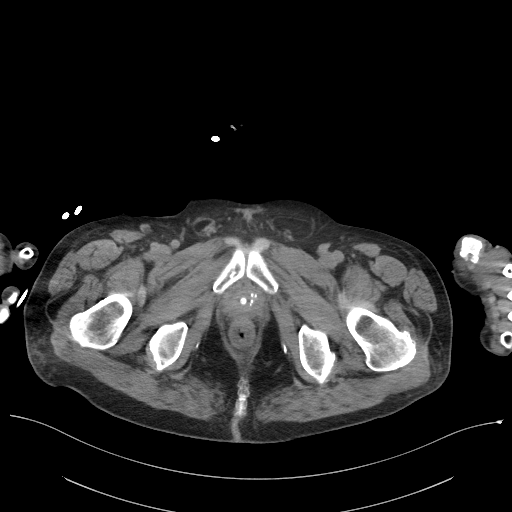
[im 19/93  soft-tissue]
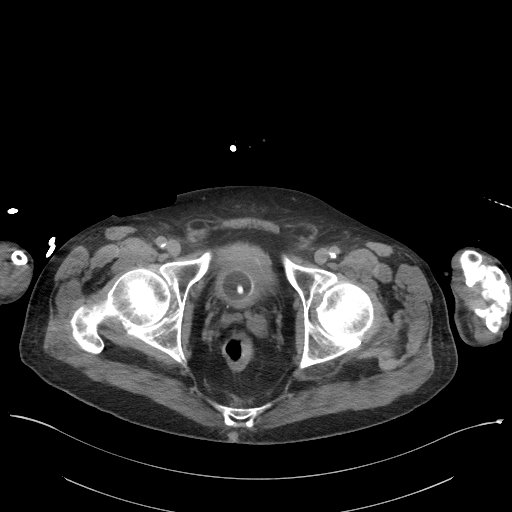
[im 26/93  soft-tissue]
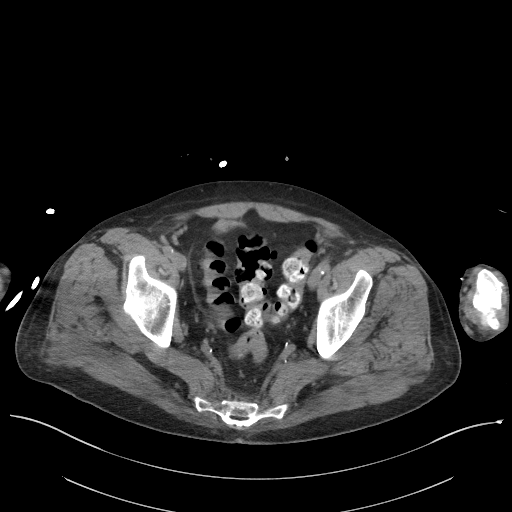
[im 30/93  soft-tissue]
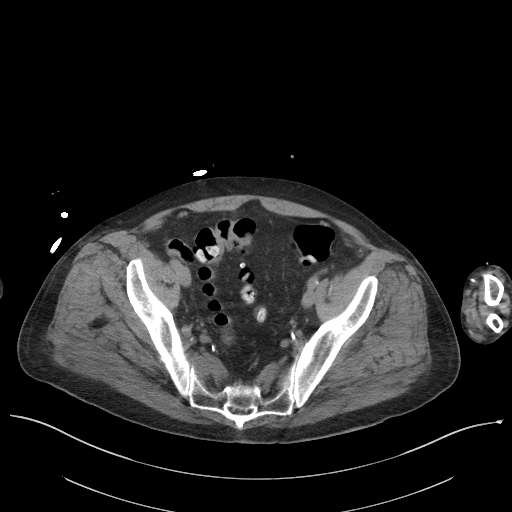
[im 37/93  soft-tissue]
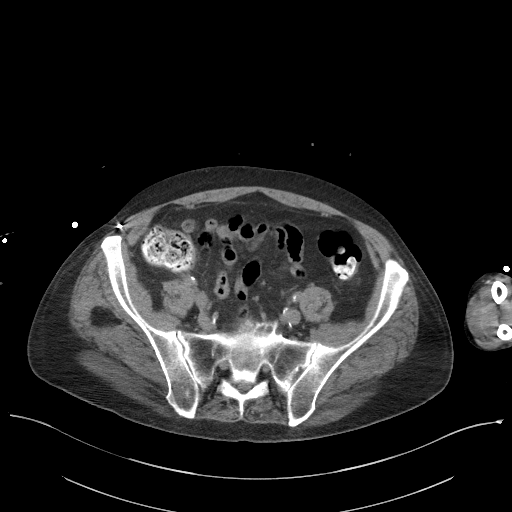
[im 45/93  soft-tissue]
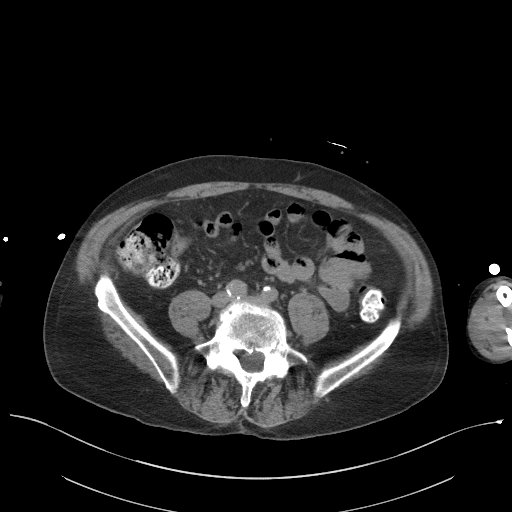
[im 48/93  soft-tissue]
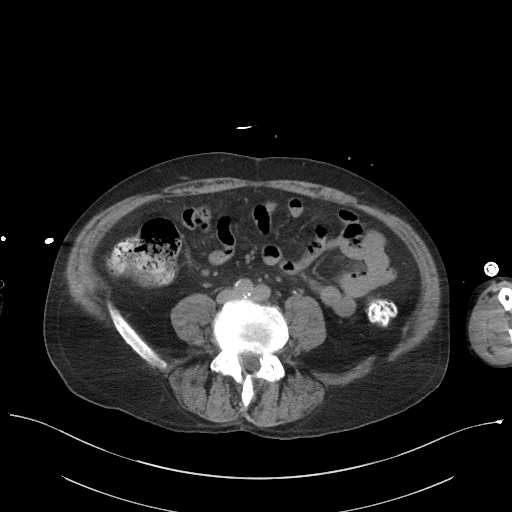
[im 56/93  soft-tissue]
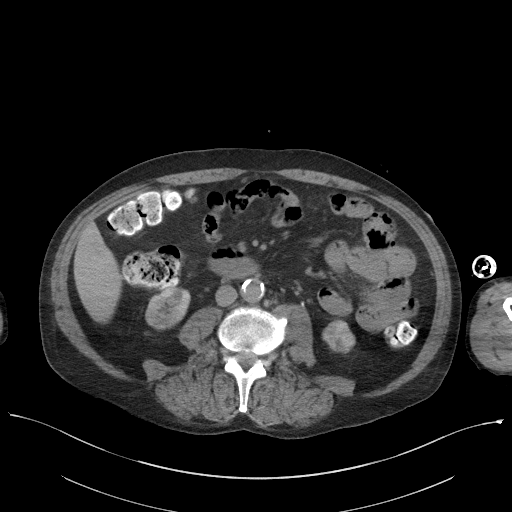
[im 56/93  bone]
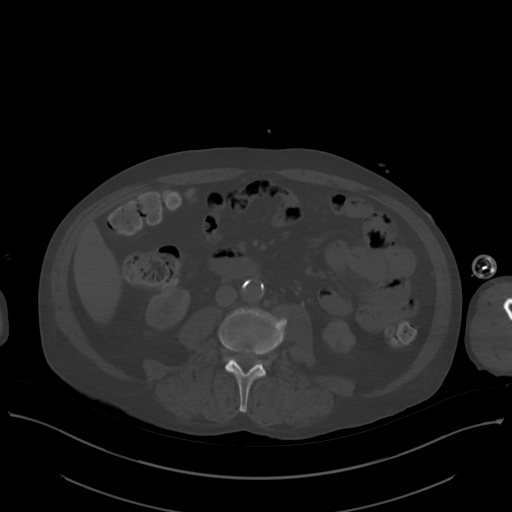
[im 63/93  soft-tissue]
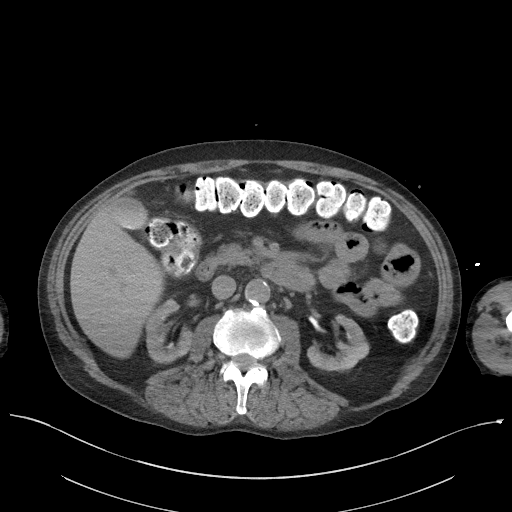
[im 70/93  soft-tissue]
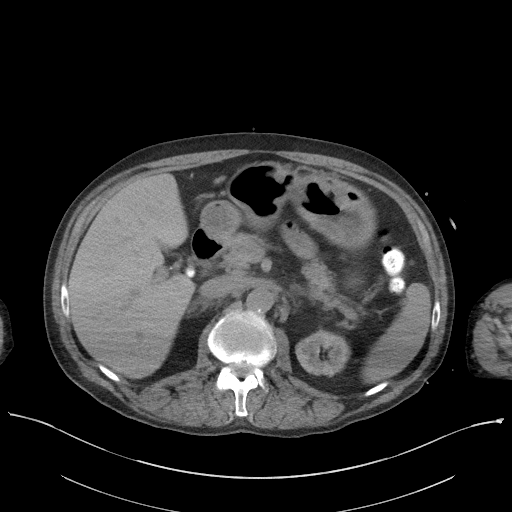
[im 74/93  soft-tissue]
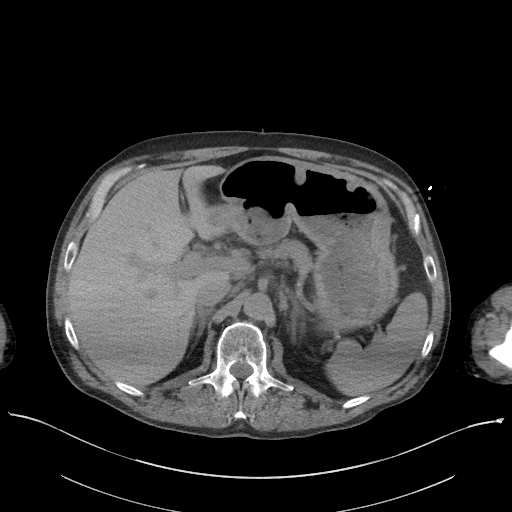
[im 81/93  soft-tissue]
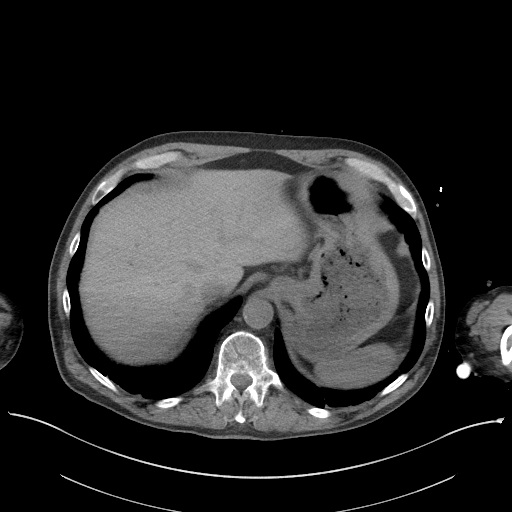
[im 89/93  soft-tissue]
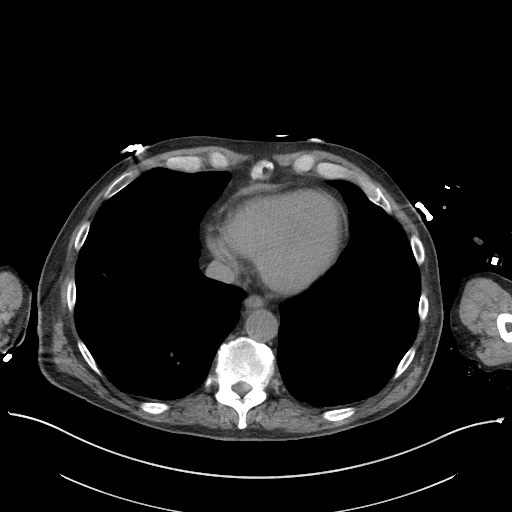

[Series 6: a/p w/o cor · coronal · non-contrast · 0.90mm/px · 3 of 167 slices shown]
[im 56/167  soft-tissue]
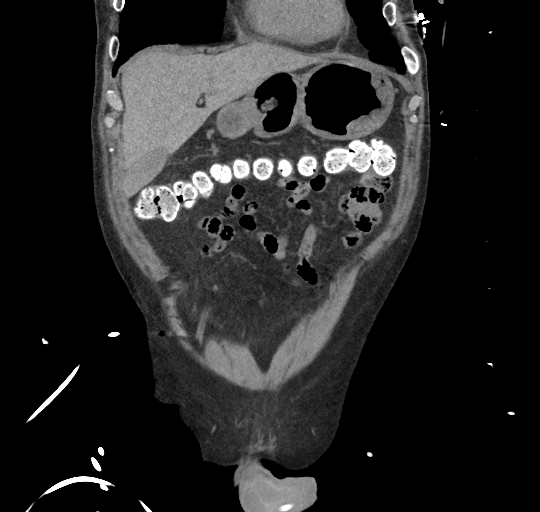
[im 74/167  soft-tissue]
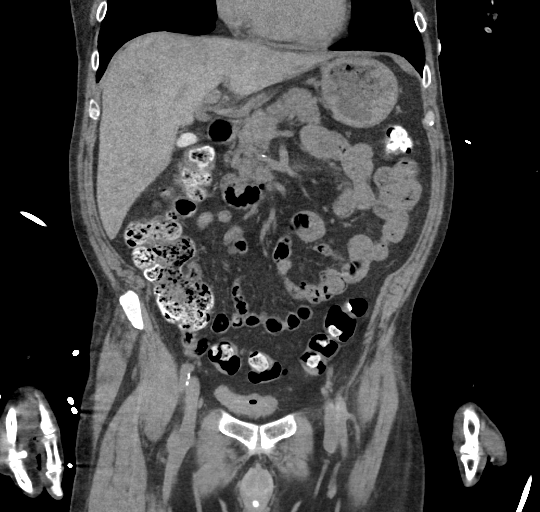
[im 93/167  soft-tissue]
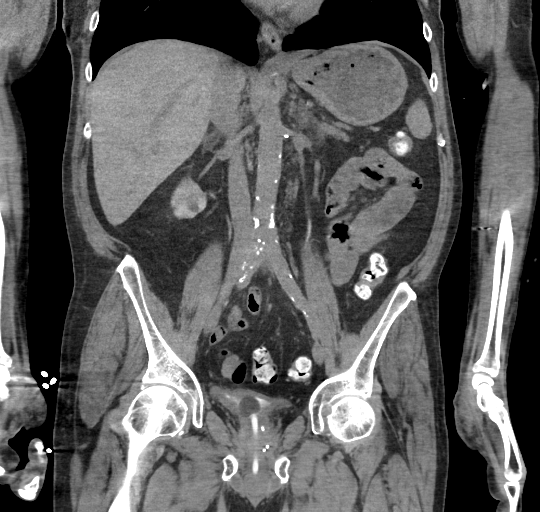

[17 of 46 positions shown; findings below may reference images not displayed]

FINDINGS: Lower chest:  No contributory findings.

Hepatobiliary: No focal liver abnormality.No evidence of biliary
obstruction or stone.

Pancreas: Unremarkable.

Spleen: Unremarkable.

Adrenals/Urinary Tract: Negative adrenals. Atrophic kidneys which
retain contrast from remote procedure. Small cystic density on the
right. Embolization coils overlap the lower pole right kidney where
there is some scar-like thinning. Decompressed bladder by a Foley
catheter.

Stomach/Bowel:  No obstruction. No visible bowel inflammation

Vascular/Lymphatic: In diffuse atheromatous plaque. No mass or
adenopathy.

Reproductive:Dystrophic prostate calcifications, incidental

Other: No retroperitoneal or other hemorrhage noted.

Musculoskeletal: Ordinary spinal degeneration but notable, albeit
chronic, left paracentral herniation at T10-11, likely deforming the
cord.
IMPRESSION: 1. Negative for retroperitoneal hemorrhage or other acute finding.
2. Continued retention of contrast in the kidneys suggesting ATN.
3. Chronic findings are described above.

## 2022-01-19 MED ORDER — OXYCODONE HCL 5 MG PO TABS
5.0000 mg | ORAL_TABLET | Freq: Four times a day (QID) | ORAL | Status: DC | PRN
  Administered 2022-01-19: 5 mg via ORAL
  Filled 2022-01-19: qty 1

## 2022-01-19 MED ORDER — HYDROMORPHONE HCL 1 MG/ML IJ SOLN: 0.5000 mg | INTRAMUSCULAR | Status: DC | PRN

## 2022-01-19 MED ORDER — SODIUM ZIRCONIUM CYCLOSILICATE 10 G PO PACK
10.0000 g | PACK | Freq: Once | ORAL | Status: DC
Start: 2022-01-19 — End: 2022-01-20
  Filled 2022-01-19: qty 1

## 2022-01-19 MED ORDER — HEPARIN SODIUM (PORCINE) 1000 UNIT/ML IJ SOLN
INTRAMUSCULAR | Status: AC
  Filled 2022-01-19: qty 3

## 2022-01-19 MED ORDER — ACETAMINOPHEN 500 MG PO TABS
1000.0000 mg | ORAL_TABLET | Freq: Three times a day (TID) | ORAL | Status: DC
  Administered 2022-01-19 – 2022-01-20 (×3): 1000 mg via ORAL
  Filled 2022-01-19 (×3): qty 2

## 2022-01-19 MED ORDER — SODIUM CHLORIDE 0.9% IV SOLUTION: Freq: Once | INTRAVENOUS | Status: AC

## 2022-01-19 MED ORDER — HYDROMORPHONE HCL 1 MG/ML IJ SOLN
0.5000 mg | Freq: Once | INTRAMUSCULAR | Status: AC
  Administered 2022-01-19: 0.5 mg via INTRAVENOUS
  Filled 2022-01-19: qty 1

## 2022-01-19 NOTE — Progress Notes (Signed)
Pt with drop in HgB from 8.5 to 6.7. I will order a CT abd/pelvis to make there is not devloping RP bleed, but suspect that this represents dilution in someone with CKD/ESRD.  Asymptomatic, will monitor for now, recheck H&H in 4 hours. Roland Rack, MD [facility] [phone]  If 7pm- 7am, please page neurology on call as listed in [place].

## 2022-01-19 NOTE — Progress Notes (Signed)
° °  Pt was seen with Dr Estanislado Pandy this am  Post Occluded RT ICA prox  to the cavernous seg. Distal reconstitution  via LT ophthalmic artery from multiple Lt ECA collaterals. 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 Post CT no ICH   Exam today Hg 6.7 this am )8.2) Repeat hg 9 am per MD  Up in bed Alert A/O Speech and vision no changes Moving all 4s Tongue midline Rt groin NT no bleeding; no hematoma Rt foot warm; good pulses  Plan:  will need CTA Head/Neck with Cx 8 weeks Pt will hear from scheduler for follow up with Dr Estanislado Pandy

## 2022-01-19 NOTE — Progress Notes (Addendum)
PROGRESS NOTE  Caleb Simmons OJJ:[id number] DOB: 1951/04/20   PCP: Pcp, No  Patient is from: Home.   DOA: 01/14/2022 LOS: 5  Chief complaints:  Chief Complaint  Patient presents with   Facial Droop   Extremity Weakness     Brief Narrative / Interim history: 71 year old M with PMH of amyloidosis, plasma cell myeloma, ESRD on HD MWF, tobacco use disorder, combined CHF and osteoarthritis presented to [facility] ED on 1/23 with facial droop and slurred speech, and found to have left MCA CVA, likely watershed from hypoperfusion in the setting of left ICA stenosis.  Patient was transferred to [facility] on 1/24.  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  IR recommended CTA head and neck in 8 weeks.  Hospital service assumed care on 1/28.  Subjective: Seen and examined earlier this morning.  No major events overnight.  Complains left hip pain that he attributes to chronic arthritis.  Pain gets as bad as 9/10 at its worst.  Tylenol did not help much.  No other complaints.  He denies headache, vision change, chest pain, dyspnea, GI or UTI symptoms.  Denies focal numbness, tingling or weakness.  Objective: Vitals:   01/19/22 1000 01/19/22 1054 01/19/22 1100 01/19/22 1115  BP: 115/62  116/62   Pulse: 82 84 81 75  Resp: 15 20 18 16   Temp:  98.7 F (37.1 C)  97.8 F (36.6 C)  TempSrc:  Oral  Oral  SpO2: 97% 94% 98% 100%  Weight:        Examination:  GENERAL: No apparent distress.  Nontoxic. HEENT: MMM.  Vision and hearing grossly intact.  NECK: Supple.  No apparent JVD.  RESP:  No IWOB.  Fair aeration bilaterally. CVS:  RRR. Heart sounds normal.  ABD/GI/GU: BS+. Abd soft, NTND.  MSK/EXT:  Moves extremities. No apparent deformity. No edema.  SKIN: no apparent skin lesion or wound NEURO: Awake,  alert and oriented appropriately.  No apparent focal neuro deficit. PSYCH: Calm. Normal affect.   Procedures:  1/27-attempted left ICA revascularization  Microbiology summarized: COVID-19 and influenza PCR nonreactive.  Assessment & Plan: Acute ischemic left MCA CVA, POA Left ICA occlusion/high-grade stenosis -S/p attempted left ICA revascularization by neuro IR, who recommends outpatient follow-up in 8 wks for CTA -Neurology signed off. -On aspirin and Brilinta for 30 days then aspirin alone -Long-term BP goal 130-150 SBP given left ICA occlusion -Continue statin   ESRD on HD MWF-missed HD on Friday for procedure -Plan for HD today per nephrology  AL Amyloidosis/plasma cell myeloma: Followed by Dr.  Burr Medico.  Started chemo on 09/10/2021.  Plan 6 month treatment. -Outpatient follow-up with oncology  Chronic combined CHF: Stable.   Chronic orthostatic hypotension: Soft blood pressure this morning.  Not symptomatic. -Midodrine 5 mg p.o. 3 times daily -Discontinue Cleviprex   BPH: Tamsulosin 0.4 mg p.o. daily   Anemia of chronic disease: Patient denies melena or hematochezia.  No obvious signs of bleeding anywhere.  Patient was on IV fluid overnight which could have caused hemodilution.  CT abdomen and pelvis without acute finding. Recent Labs    12/06/21 0752 12/27/21 1009 01/03/22 0802 01/10/22 0744 01/14/22 1342 01/14/22 1349 01/16/22 0355 01/18/22 0329 01/19/22 0317 01/19/22 0842  HGB 10.2* 9.8* 10.1* 9.1* 9.4* 9.2* 8.5* 8.2* 6.7* 6.6*  -Transfuse 1 unit -Monitor  H&H   Tobacco use disorder -Counseled on need for complete cessation.  Hyperkalemia: K5.3. -Lokelma 10 g x 1   GERD:  -Continue PPI   Anxiety:  -Continue Xanax  Physical deconditioning -PT/OT  Osteoarthritis/left hip pain -Scheduled Tylenol with as needed Oxy IR and hydromorphone   Malnutrition of moderate degree Body mass index is 23.41 kg/m.  -Consult dietitian       DVT prophylaxis:   SCD's Start: 01/14/22 2013  Code Status: Full code Family Communication: Updated family member (a friend) at bedside Level of care: Progressive Status is: Inpatient  Remains inpatient appropriate because: Acute CVA with left ICA stenosis, hypotension and anemia requiring blood transfusion   Final disposition: Home.    Consultants:  Neurology Interventional neuro IR   Sch Meds:  Scheduled Meds:   stroke: mapping our early stages of recovery book   Does not apply Once   acetaminophen  1,000 mg Oral Q8H   aspirin EC  81 mg Oral Daily   atorvastatin  40 mg Oral QHS   calcitRIOL  0.25 mcg Oral Q M,W,F-HD   Chlorhexidine Gluconate Cloth  6 each Topical I6270   folic acid  2 mg Oral q AM   midodrine  5 mg Oral TID WC   mirtazapine  30 mg Oral QHS   mupirocin ointment  1 application Nasal BID   pantoprazole  40 mg Oral BID AC   sevelamer carbonate  800 mg Oral TID WC   sodium zirconium cyclosilicate  10 g Oral Once   tamsulosin  0.4 mg Oral q AM   ticagrelor  90 mg Oral BID   Or   ticagrelor  90 mg Per Tube BID   Continuous Infusions: PRN Meds:.ALPRAZolam, ALPRAZolam, HYDROmorphone (DILAUDID) injection, oxyCODONE  Antimicrobials: Anti-infectives (From admission, onward)    Start     Dose/Rate Route Frequency Ordered Stop   01/18/22 1353  ceFAZolin (ANCEF) 2-4 GM/100ML-% IVPB       Note to Pharmacy: Peggyann Shoals: cabinet override      01/18/22 1353 01/18/22 1407        I have personally reviewed the following labs and images: CBC: Recent Labs  Lab 01/14/22 1342 01/14/22 1349 01/16/22 0355 01/18/22 0329 01/19/22 0317 01/19/22 0842  WBC 6.6  --  6.7 7.1 6.6  --   NEUTROABS 4.6  --   --   --  5.5  --   HGB 9.4* 9.2* 8.5* 8.2* 6.7* 6.6*  HCT 28.7* 27.0* 25.7* 24.8* 20.3* 19.7*  MCV 91.4  --  91.5 90.5 91.9  --   PLT 205  --  219 223 221  --    BMP &GFR Recent Labs  Lab 01/14/22 1342 01/14/22 1349 01/15/22 1514 01/16/22 0355 01/18/22 0329  01/19/22 0317 01/19/22 0333  NA 136 135  --  133* 135 135 135  K 4.5 4.5  --  5.2* 4.7 5.3* 5.3*  CL 105 104  --  104 106 108 108  CO2 23  --   --  19* 21* 19* 18*  GLUCOSE 114* 110*  --  88 95 108* 109*  BUN 32* 30*  --  38* 27* 34* 35*  CREATININE 3.68* 3.90*  --  4.73* 4.72* 5.13* 5.12*  CALCIUM 8.3*  --   --  8.5* 8.6* 8.2* 8.1*  MG  --   --  1.9  --   --   --   --   PHOS  --   --  5.7*  --  5.0*  --  6.0*   Estimated Creatinine Clearance: 13.7 mL/min (A) (by C-G formula based on SCr of 5.12 mg/dL (H)). Liver & Pancreas: Recent Labs  Lab 01/14/22 1342 01/15/22 1514 01/18/22 0329 01/19/22 0333  AST 14* 11*  --   --   ALT 12 11  --   --   ALKPHOS 86 72  --   --   BILITOT 0.6 0.3  --   --   PROT 5.9* 5.0*  --   --   ALBUMIN 3.2* 2.7* 2.9* 2.7*   No results for input(s): LIPASE, AMYLASE in the last 168 hours. No results for input(s): AMMONIA in the last 168 hours. Diabetic: No results for input(s): HGBA1C in the last 72 hours. No results for input(s): GLUCAP in the last 168 hours. Cardiac Enzymes: No results for input(s): CKTOTAL, CKMB, CKMBINDEX, TROPONINI in the last 168 hours. No results for input(s): PROBNP in the last 8760 hours. Coagulation Profile: Recent Labs  Lab 01/14/22 1342 01/16/22 0355  INR 1.0 1.0   Thyroid Function Tests: No results for input(s): TSH, T4TOTAL, FREET4, T3FREE, THYROIDAB in the last 72 hours. Lipid Profile: No results for input(s): CHOL, HDL, LDLCALC, TRIG, CHOLHDL, LDLDIRECT in the last 72 hours. Anemia Panel: No results for input(s): VITAMINB12, FOLATE, FERRITIN, TIBC, IRON, RETICCTPCT in the last 72 hours. Urine analysis:    Component Value Date/Time   COLORURINE STRAW (A) 01/14/2022 1823   APPEARANCEUR CLEAR 01/14/2022 1823   LABSPEC 1.010 01/14/2022 1823   PHURINE 8.0 01/14/2022 1823   GLUCOSEU 50 (A) 01/14/2022 1823   HGBUR NEGATIVE 01/14/2022 1823   BILIRUBINUR NEGATIVE 01/14/2022 1823   KETONESUR NEGATIVE 01/14/2022  1823   PROTEINUR >=300 (A) 01/14/2022 1823   UROBILINOGEN 0.2 06/26/2007 1023   NITRITE NEGATIVE 01/14/2022 1823   LEUKOCYTESUR NEGATIVE 01/14/2022 1823   Sepsis Labs: Invalid input(s): PROCALCITONIN, [place]  Microbiology: Recent Results (from the past 240 hour(s))  Resp Panel by RT-PCR (Flu A&B, Covid) Nasopharyngeal Swab     Status: None   Collection Time: 01/14/22  1:43 PM   Specimen: Nasopharyngeal Swab; Nasopharyngeal(NP) swabs in vial transport medium  Result Value Ref Range Status   SARS Coronavirus 2 by RT PCR NEGATIVE NEGATIVE Final    Comment: (NOTE) SARS-CoV-2 target nucleic acids are NOT DETECTED.  The SARS-CoV-2 RNA is generally detectable in upper respiratory specimens during the acute phase of infection. The lowest concentration of SARS-CoV-2 viral copies this assay can detect is 138 copies/mL. A negative result does not preclude SARS-Cov-2 infection and should not be used as the sole basis for treatment or other patient management decisions. A negative result may occur with  improper specimen collection/handling, submission of specimen other than nasopharyngeal swab, presence of viral mutation(s) within the areas targeted by this assay, and inadequate number of viral copies(<138 copies/mL). A negative result must be combined with clinical observations, patient history, and epidemiological information. The expected result is Negative.  Fact Sheet for Patients:  EntrepreneurPulse.com.au  Fact Sheet for Healthcare Providers:  IncredibleEmployment.be  This test is no t yet approved or cleared by the [place] FDA and  has been authorized for detection and/or diagnosis of SARS-CoV-2 by FDA under an Emergency Use Authorization (EUA). This EUA will remain  in effect (meaning this test can be used) for the duration of the COVID-19 declaration under Section 564(b)(1) of the Act, 21 U.S.C.section 360bbb-3(b)(1), unless the  authorization is terminated  or revoked sooner.       Influenza A  by PCR NEGATIVE NEGATIVE Final   Influenza B by PCR NEGATIVE NEGATIVE Final    Comment: (NOTE) The Xpert Xpress SARS-CoV-2/FLU/RSV plus assay is intended as an aid in the diagnosis of influenza from Nasopharyngeal swab specimens and should not be used as a sole basis for treatment. Nasal washings and aspirates are unacceptable for Xpert Xpress SARS-CoV-2/FLU/RSV testing.  Fact Sheet for Patients: EntrepreneurPulse.com.au  Fact Sheet for Healthcare Providers: IncredibleEmployment.be  This test is not yet approved or cleared by the [place] FDA and has been authorized for detection and/or diagnosis of SARS-CoV-2 by FDA under an Emergency Use Authorization (EUA). This EUA will remain in effect (meaning this test can be used) for the duration of the COVID-19 declaration under Section 564(b)(1) of the Act, 21 U.S.C. section 360bbb-3(b)(1), unless the authorization is terminated or revoked.  Performed at [facility], [place] [address] [place], [place] [address]   Surgical PCR screen     Status: Abnormal   Collection Time: 01/18/22  9:24 AM   Specimen: Nasal Mucosa; Nasal Swab  Result Value Ref Range Status   MRSA, PCR NEGATIVE NEGATIVE Final   Staphylococcus aureus POSITIVE (A) NEGATIVE Final    Comment: (NOTE) The Xpert SA Assay (FDA approved for NASAL specimens in patients 67 years of age and older), is one component of a comprehensive surveillance program. It is not intended to diagnose infection nor to guide or monitor treatment. Performed at [facility], [place] [address] [place], [place] [address]     Radiology Studies: CT ABDOMEN PELVIS WO CONTRAST  Result Date: 01/19/2022 CLINICAL DATA:  Dropping hemoglobin, rule out retroperitoneal bleed EXAM: CT ABDOMEN AND PELVIS WITHOUT CONTRAST TECHNIQUE: Multidetector CT imaging of the abdomen and  pelvis was performed following the standard protocol without IV contrast. RADIATION DOSE REDUCTION: This exam was performed according to the departmental dose-optimization program which includes automated exposure control, adjustment of the mA and/or kV according to patient size and/or use of iterative reconstruction technique. COMPARISON:  08/30/2021 FINDINGS: Lower chest:  No contributory findings. Hepatobiliary: No focal liver abnormality.No evidence of biliary obstruction or stone. Pancreas: Unremarkable. Spleen: Unremarkable. Adrenals/Urinary Tract: Negative adrenals. Atrophic kidneys which retain contrast from remote procedure. Small cystic density on the right. Embolization coils overlap the lower pole right kidney where there is some scar-like thinning. Decompressed bladder by a Foley catheter. Stomach/Bowel:  No obstruction. No visible bowel inflammation Vascular/Lymphatic: In diffuse atheromatous plaque. No mass or adenopathy. Reproductive:Dystrophic prostate calcifications, incidental Other: No retroperitoneal or other hemorrhage noted. Musculoskeletal: Ordinary spinal degeneration but notable, albeit chronic, left paracentral herniation at T10-11, likely deforming the cord. IMPRESSION: 1. Negative for retroperitoneal hemorrhage or other acute finding. 2. Continued retention of contrast in the kidneys suggesting ATN. 3. Chronic findings are described above. Electronically Signed   By: Jorje Guild M.D.   On: 01/19/2022 06:44      Aneya Daddona T. Ingram  If 7PM-7AM, please contact night-coverage [email or web address] 01/19/2022, 12:04 PM

## 2022-01-19 NOTE — Progress Notes (Signed)
[facility] Progress Note  Subjective: alert and no c/o's.   Vitals:   01/19/22 1300 01/19/22 1342 01/19/22 1410 01/19/22 1411  BP: 120/67 131/68 130/71 135/74  Pulse: 82 85 85 85  Resp: 18 15    Temp:  (!) 97.2 F (36.2 C) (!) 97.2 F (36.2 C)   TempSrc:  Axillary Axillary   SpO2: 100% 100% 100% 100%  Weight:        Exam:  alert, nad   no jvd  Chest cta bilat  Cor reg no RG  Abd soft ntnd no ascites   Ext no LE edema   Alert, nonfocal, Ox 3  RIJ TDC intact      Home meds include - xanax prn, midodrine 5 tid, remeron, oxy IR prn, protonix, renvela 800 tid, flomax, ambien prn        OP HD: [facility] MWF   4h  400/1.5  76.5kg  2/2 bath  RIJ TDC  Hep none   - venofer 100 x 10 (9 left)   - calcitriol 0.25 ug po tiw      Alb 3.2  Ca 8.3  tsat 47%     Hb 9.2       CXR 1/24 - IMPRESSION: No evidence of active disease.       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 SP angiogram procedure by Dr Estanislado Pandy on 1/27. Per Neuro/pmd.  ESRD - on HD MWF. HD today in progress.  HTN/ vol - BP's not high, cont midodrine 5 tid, under dry wt 1-2kg.  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 Tsat good at 47%.  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         Caleb Simmons [place] 01/19/2022, 3:08 PM   Recent Labs  Lab 01/18/22 0329 01/19/22 0317 01/19/22 0333 01/19/22 0842  K 4.7 5.3* 5.3*  --   BUN 27* 34* 35*  --   CREATININE 4.72* 5.13* 5.12*  --   CALCIUM 8.6* 8.2* 8.1*  --   PHOS 5.0*  --  6.0*  --   HGB 8.2* 6.7*  --  6.6*    Inpatient medications:   stroke: mapping our early stages of recovery book   Does not apply Once   acetaminophen  1,000 mg Oral Q8H   aspirin EC  81 mg Oral Daily   atorvastatin  40 mg Oral QHS   calcitRIOL  0.25 mcg Oral Q M,W,F-HD   Chlorhexidine Gluconate Cloth  6 each Topical K5993   folic acid  2 mg  Oral q AM   midodrine  5 mg Oral TID WC   mirtazapine  30 mg Oral QHS   mupirocin ointment  1 application Nasal BID   pantoprazole  40 mg Oral BID AC   sevelamer carbonate  800 mg Oral TID WC   sodium zirconium cyclosilicate  10 g Oral Once   tamsulosin  0.4 mg Oral q AM   ticagrelor  90 mg Oral BID   Or   ticagrelor  90 mg Per Tube BID     ALPRAZolam, ALPRAZolam, HYDROmorphone (DILAUDID) injection, oxyCODONE

## 2022-01-19 NOTE — Progress Notes (Addendum)
STROKE TEAM PROGRESS NOTE   SUBJECTIVE (INTERVAL HISTORY) His wife is at the bedside. Pt was seen at ICU after endovascular procedure yesterday.  Patient currently awake alert, neuro intact. Hgb drop overnight from 8.2-> 6.7 ->6.6. 1u PRBC ordered by hospitalist to transfuse. Plan for dialysis today as well. Per Neuro IR team, CTA outpatient in 8weeks and then follow up with Dr. Estanislado Pandy outpatient.   OBJECTIVE Temp:  [97 F (36.1 C)-100.1 F (37.8 C)] 97.5 F (36.4 C) (01/28 0800) Pulse Rate:  [75-92] 82 (01/28 1000) Cardiac Rhythm: Normal sinus rhythm (01/27 1900) Resp:  [4-25] 15 (01/28 1000) BP: (92-130)/(53-85) 115/62 (01/28 1000) SpO2:  [97 %-100 %] 97 % (01/28 1000) Arterial Line BP: (113-166)/(47-68) 144/55 (01/28 0900)  No results for input(s): GLUCAP in the last 168 hours. Recent Labs  Lab 01/14/22 1342 01/14/22 1349 01/15/22 1514 01/16/22 0355 01/18/22 0329 01/19/22 0317 01/19/22 0333  NA 136 135  --  133* 135 135 135  K 4.5 4.5  --  5.2* 4.7 5.3* 5.3*  CL 105 104  --  104 106 108 108  CO2 23  --   --  19* 21* 19* 18*  GLUCOSE 114* 110*  --  88 95 108* 109*  BUN 32* 30*  --  38* 27* 34* 35*  CREATININE 3.68* 3.90*  --  4.73* 4.72* 5.13* 5.12*  CALCIUM 8.3*  --   --  8.5* 8.6* 8.2* 8.1*  MG  --   --  1.9  --   --   --   --   PHOS  --   --  5.7*  --  5.0*  --  6.0*    Recent Labs  Lab 01/14/22 1342 01/15/22 1514 01/18/22 0329 01/19/22 0333  AST 14* 11*  --   --   ALT 12 11  --   --   ALKPHOS 86 72  --   --   BILITOT 0.6 0.3  --   --   PROT 5.9* 5.0*  --   --   ALBUMIN 3.2* 2.7* 2.9* 2.7*    Recent Labs  Lab 01/14/22 1342 01/14/22 1349 01/16/22 0355 01/18/22 0329 01/19/22 0317 01/19/22 0842  WBC 6.6  --  6.7 7.1 6.6  --   NEUTROABS 4.6  --   --   --  5.5  --   HGB 9.4* 9.2* 8.5* 8.2* 6.7* 6.6*  HCT 28.7* 27.0* 25.7* 24.8* 20.3* 19.7*  MCV 91.4  --  91.5 90.5 91.9  --   PLT 205  --  219 223 221  --     No results for input(s): CKTOTAL,  CKMB, CKMBINDEX, TROPONINI in the last 168 hours. No results for input(s): LABPROT, INR in the last 72 hours.  No results for input(s): COLORURINE, LABSPEC, [place], GLUCOSEU, HGBUR, BILIRUBINUR, KETONESUR, PROTEINUR, UROBILINOGEN, NITRITE, LEUKOCYTESUR in the last 72 hours.  Invalid input(s): APPERANCEUR      Component Value Date/Time   CHOL 239 (H) 01/15/2022 1514   TRIG 535 (H) 01/15/2022 1514   HDL 28 (L) 01/15/2022 1514   CHOLHDL 8.5 01/15/2022 1514   VLDL UNABLE TO CALCULATE IF TRIGLYCERIDE OVER 400 mg/dL 01/15/2022 1514   LDLCALC UNABLE TO CALCULATE IF TRIGLYCERIDE OVER 400 mg/dL 01/15/2022 1514   Lab Results  Component Value Date   HGBA1C 5.6 01/16/2022      Component Value Date/Time   LABOPIA NONE DETECTED 01/14/2022 1823   COCAINSCRNUR NONE DETECTED 01/14/2022 1823   LABBENZ POSITIVE (A) 01/14/2022 1823  AMPHETMU NONE DETECTED 01/14/2022 1823   THCU NONE DETECTED 01/14/2022 1823   LABBARB NONE DETECTED 01/14/2022 1823    Recent Labs  Lab 01/14/22 1342  ETH <10     I have personally reviewed the radiological images below and agree with the radiology interpretations.  CT ABDOMEN PELVIS WO CONTRAST  Result Date: 01/19/2022 CLINICAL DATA:  Dropping hemoglobin, rule out retroperitoneal bleed EXAM: CT ABDOMEN AND PELVIS WITHOUT CONTRAST TECHNIQUE: Multidetector CT imaging of the abdomen and pelvis was performed following the standard protocol without IV contrast. RADIATION DOSE REDUCTION: This exam was performed according to the departmental dose-optimization program which includes automated exposure control, adjustment of the mA and/or kV according to patient size and/or use of iterative reconstruction technique. COMPARISON:  08/30/2021 FINDINGS: Lower chest:  No contributory findings. Hepatobiliary: No focal liver abnormality.No evidence of biliary obstruction or stone. Pancreas: Unremarkable. Spleen: Unremarkable. Adrenals/Urinary Tract: Negative adrenals. Atrophic  kidneys which retain contrast from remote procedure. Small cystic density on the right. Embolization coils overlap the lower pole right kidney where there is some scar-like thinning. Decompressed bladder by a Foley catheter. Stomach/Bowel:  No obstruction. No visible bowel inflammation Vascular/Lymphatic: In diffuse atheromatous plaque. No mass or adenopathy. Reproductive:Dystrophic prostate calcifications, incidental Other: No retroperitoneal or other hemorrhage noted. Musculoskeletal: Ordinary spinal degeneration but notable, albeit chronic, left paracentral herniation at T10-11, likely deforming the cord. IMPRESSION: 1. Negative for retroperitoneal hemorrhage or other acute finding. 2. Continued retention of contrast in the kidneys suggesting ATN. 3. Chronic findings are described above. Electronically Signed   By: Jorje Guild M.D.   On: 01/19/2022 06:44   CT Angio Head W/Cm &/Or Wo Cm  Result Date: 01/14/2022 CLINICAL DATA:  Right hand weakness, right-sided facial droop and slurred speech EXAM: CT ANGIOGRAPHY HEAD AND NECK TECHNIQUE: Multidetector CT imaging of the head and neck was performed using the standard protocol during bolus administration of intravenous contrast. Multiplanar CT image reconstructions and MIPs were obtained to evaluate the vascular anatomy. Carotid stenosis measurements (when applicable) are obtained utilizing NASCET criteria, using the distal internal carotid diameter as the denominator. RADIATION DOSE REDUCTION: This exam was performed according to the departmental dose-optimization program which includes automated exposure control, adjustment of the mA and/or kV according to patient size and/or use of iterative reconstruction technique. CONTRAST:  153mL OMNIPAQUE IOHEXOL 350 MG/ML SOLN COMPARISON:  01/14/2022 CT head and MRI FINDINGS: CT HEAD FINDINGS Brain: No evidence of acute hemorrhage, cerebral edema, mass, mass effect, or midline shift. No hydrocephalus or extra-axial  fluid collection. Periventricular white matter changes, likely the sequela of chronic small vessel ischemic disease. The acute infarcts seen on the same-day MRI are not significantly apparent on this CT. Vascular: No hyperdense vessel. Skull: Normal. Negative for fracture or focal lesion. Sinuses/Orbits: No acute finding. Other: The mastoid air cells are well aerated. CTA NECK FINDINGS Aortic arch: Standard branching. Imaged portion shows no evidence of aneurysm or dissection. No significant stenosis of the major arch vessel origins. Right carotid system: No evidence of dissection, stenosis (50% or greater) or occlusion. Left carotid system: Evaluation of the origin of the left common carotid artery is somewhat limited due to beam hardening artifact from the adjacent venous bolus, however there appears to be mild narrowing at the origin. 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 No distal reconstitution of the extracranial ICA. Vertebral arteries: Moderate narrowing at the origin of the right vertebral artery, secondary to noncalcified plaque.  Focal moderate stenosis of the right V2 (series 12, image 248) at the level of C6, as well as in the right C5-C6 foramen, secondary to degenerative changes (series 12, image 242). The right vertebral artery is otherwise patent. The left vertebral artery demonstrates mild calcified narrowing in the V2 segment, but is otherwise patent. Codominant. Skeleton: Multilevel degenerative changes in the cervical spine, with trace retrolisthesis of C5 on C6. No acute osseous abnormality. Other neck: Negative. Upper chest: Dependent atelectasis. Review of the MIP images confirms the above findings CTA HEAD FINDINGS Evaluation is somewhat limited by degree of venous contamination. Anterior circulation: The left intracranial internal carotid artery is non-opacified until the ophthalmic and supraclinoid portions (series  12, image 111). Calcifications in the right intracranial internal carotid artery, which cause moderate narrowing in the distal cavernous segment and proximal supraclinoid segment. Occlusion of the left A1 (series 12, image 100), with a small area of filling in the distal left A1, likely retrograde. Patent right A1. Normal anterior communicating artery. Anterior cerebral arteries are patent to their distal aspects. No M1 stenosis or occlusion. Normal MCA bifurcations. Distal MCA branches perfused and symmetric. Posterior circulation: Vertebral arteries patent to the vertebrobasilar junction, with mild calcifications and irregularity in the right V4 segment, prior to the PICA takeoff (series 12, image 145), which causes mild-to-moderate narrowing. Mild to moderate narrowing in the left V4, proximal to the left PICA takeoff (series 12, image 144). Posterior inferior cerebral arteries patent bilaterally. Basilar patent to its distal aspect. Superior cerebellar arteries patent bilaterally. Bilateral P1 segments originate from the basilar artery. PCAs perfused to their distal aspects, with mild narrowing in the left P1/P2 and poor opacification of the distal left P2 and P3 segments. The bilateral posterior communicating arteries are not definitively seen. Venous sinuses: As permitted by contrast timing, patent. Anatomic variants: None significant Review of the MIP images confirms the above findings IMPRESSION: 1. 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 The left intracranial internal carotid artery reconstitutes in the ophthalmic and supraclinoid portions. 2. Moderate narrowing in the distal right cavernous ICA and proximal supraclinoid segment. 3. Occlusion of the left A1.  The left ACA is otherwise patent. 4. Moderate stenosis at the origin of the right vertebral artery, with additional focal moderate stenosis of the right V2 at the level of C6, both secondary to  atherosclerotic plaque and degenerative changes. Mild-to-moderate stenosis of the bilateral V4 segments. 5. Poor opacification of the distal left P2 and P3 segments. These results were called by telephone at the time of interpretation on 01/14/2022 at 8:48 pm to provider DR. DOUTOVA , who verbally acknowledged these results. Electronically Signed   By: Merilyn Baba M.D.   On: 01/14/2022 20:48   DG Chest 2 View  Result Date: 01/14/2022 CLINICAL DATA:  Facial droop EXAM: CHEST - 2 VIEW COMPARISON:  09/06/2021 FINDINGS: Right dialysis catheter remains in place, unchanged. Heart and mediastinal contours are within normal limits. No focal opacities or effusions. No acute bony abnormality. Old healed left rib fractures. IMPRESSION: No active cardiopulmonary disease. Electronically Signed   By: Rolm Baptise M.D.   On: 01/14/2022 19:34   CT HEAD WO CONTRAST  Result Date: 01/14/2022 CLINICAL DATA:  Facial droop and right arm weakness and numbness for the past 2 days. EXAM: CT HEAD WITHOUT CONTRAST TECHNIQUE: Contiguous axial images were obtained from the base of the skull through the vertex without intravenous contrast. RADIATION DOSE REDUCTION: This exam was performed  according to the departmental dose-optimization program which includes automated exposure control, adjustment of the mA and/or kV according to patient size and/or use of iterative reconstruction technique. COMPARISON:  None. FINDINGS: Brain: Normal size and position of the ventricles. Minimal patchy white matter low density in both cerebral hemispheres. No intracranial hemorrhage, mass lesion or CT evidence of acute infarction. Vascular: No hyperdense vessel or unexpected calcification. Skull: Normal. Negative for fracture or focal lesion. Sinuses/Orbits: Unremarkable. Other: None. IMPRESSION: 1. No acute abnormality. 2. Minimal chronic small vessel white matter ischemic changes in both cerebral hemispheres. These make it difficult to exclude a small  acute subcortical infarct. If this is a continued clinical concern, this could be further evaluated with an MRI of the brain. Electronically Signed   By: Claudie Revering M.D.   On: 01/14/2022 14:17   CT Angio Neck W and/or Wo Contrast  Result Date: 01/14/2022 CLINICAL DATA:  Right hand weakness, right-sided facial droop and slurred speech EXAM: CT ANGIOGRAPHY HEAD AND NECK TECHNIQUE: Multidetector CT imaging of the head and neck was performed using the standard protocol during bolus administration of intravenous contrast. Multiplanar CT image reconstructions and MIPs were obtained to evaluate the vascular anatomy. Carotid stenosis measurements (when applicable) are obtained utilizing NASCET criteria, using the distal internal carotid diameter as the denominator. RADIATION DOSE REDUCTION: This exam was performed according to the departmental dose-optimization program which includes automated exposure control, adjustment of the mA and/or kV according to patient size and/or use of iterative reconstruction technique. CONTRAST:  143mL OMNIPAQUE IOHEXOL 350 MG/ML SOLN COMPARISON:  01/14/2022 CT head and MRI FINDINGS: CT HEAD FINDINGS Brain: No evidence of acute hemorrhage, cerebral edema, mass, mass effect, or midline shift. No hydrocephalus or extra-axial fluid collection. Periventricular white matter changes, likely the sequela of chronic small vessel ischemic disease. The acute infarcts seen on the same-day MRI are not significantly apparent on this CT. Vascular: No hyperdense vessel. Skull: Normal. Negative for fracture or focal lesion. Sinuses/Orbits: No acute finding. Other: The mastoid air cells are well aerated. CTA NECK FINDINGS Aortic arch: Standard branching. Imaged portion shows no evidence of aneurysm or dissection. No significant stenosis of the major arch vessel origins. Right carotid system: No evidence of dissection, stenosis (50% or greater) or occlusion. Left carotid system: Evaluation of the origin of  the left common carotid artery is somewhat limited due to beam hardening artifact from the adjacent venous bolus, however there appears to be mild narrowing at the origin. 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 No distal reconstitution of the extracranial ICA. Vertebral arteries: Moderate narrowing at the origin of the right vertebral artery, secondary to noncalcified plaque. Focal moderate stenosis of the right V2 (series 12, image 248) at the level of C6, as well as in the right C5-C6 foramen, secondary to degenerative changes (series 12, image 242). The right vertebral artery is otherwise patent. The left vertebral artery demonstrates mild calcified narrowing in the V2 segment, but is otherwise patent. Codominant. Skeleton: Multilevel degenerative changes in the cervical spine, with trace retrolisthesis of C5 on C6. No acute osseous abnormality. Other neck: Negative. Upper chest: Dependent atelectasis. Review of the MIP images confirms the above findings CTA HEAD FINDINGS Evaluation is somewhat limited by degree of venous contamination. Anterior circulation: The left intracranial internal carotid artery is non-opacified until the ophthalmic and supraclinoid portions (series 12, image 111). Calcifications in the right intracranial internal carotid artery, which cause moderate narrowing  in the distal cavernous segment and proximal supraclinoid segment. Occlusion of the left A1 (series 12, image 100), with a small area of filling in the distal left A1, likely retrograde. Patent right A1. Normal anterior communicating artery. Anterior cerebral arteries are patent to their distal aspects. No M1 stenosis or occlusion. Normal MCA bifurcations. Distal MCA branches perfused and symmetric. Posterior circulation: Vertebral arteries patent to the vertebrobasilar junction, with mild calcifications and irregularity in the right V4 segment, prior to the  PICA takeoff (series 12, image 145), which causes mild-to-moderate narrowing. Mild to moderate narrowing in the left V4, proximal to the left PICA takeoff (series 12, image 144). Posterior inferior cerebral arteries patent bilaterally. Basilar patent to its distal aspect. Superior cerebellar arteries patent bilaterally. Bilateral P1 segments originate from the basilar artery. PCAs perfused to their distal aspects, with mild narrowing in the left P1/P2 and poor opacification of the distal left P2 and P3 segments. The bilateral posterior communicating arteries are not definitively seen. Venous sinuses: As permitted by contrast timing, patent. Anatomic variants: None significant Review of the MIP images confirms the above findings IMPRESSION: 1. 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 The left intracranial internal carotid artery reconstitutes in the ophthalmic and supraclinoid portions. 2. Moderate narrowing in the distal right cavernous ICA and proximal supraclinoid segment. 3. Occlusion of the left A1.  The left ACA is otherwise patent. 4. Moderate stenosis at the origin of the right vertebral artery, with additional focal moderate stenosis of the right V2 at the level of C6, both secondary to atherosclerotic plaque and degenerative changes. Mild-to-moderate stenosis of the bilateral V4 segments. 5. Poor opacification of the distal left P2 and P3 segments. These results were called by telephone at the time of interpretation on 01/14/2022 at 8:48 pm to provider DR. DOUTOVA , who verbally acknowledged these results. Electronically Signed   By: Merilyn Baba M.D.   On: 01/14/2022 20:48   MR BRAIN WO CONTRAST  Result Date: 01/14/2022 CLINICAL DATA:  Neuro deficit, acute, stroke suspected. EXAM: MRI HEAD WITHOUT CONTRAST TECHNIQUE: Multiplanar, multiecho pulse sequences of the brain and surrounding structures were obtained without intravenous contrast. COMPARISON:  Head  CT same day FINDINGS: Brain: Diffusion imaging shows multiple small acute infarctions within the left hemisphere scattered within the MCA territory which could either be due to micro embolic infarctions or watershed infarctions. Micro embolic infarctions are favored. No large confluent infarction. No mass effect or swelling. Elsewhere, the brainstem and cerebellum are normal. Cerebral hemispheres show mild to moderate chronic small-vessel ischemic changes of the white matter. No mass lesion, hemorrhage, hydrocephalus or extra-axial collection. Vascular: Major vessels at the base of the brain show flow. 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 This pattern could be due to micro embolic infarction or watershed infarction. No large confluent infarction. No mass effect or hemorrhage. Moderate chronic small-vessel ischemic changes elsewhere affecting the cerebral hemispheric white matter. Electronically Signed   By: Nelson Chimes M.D.   On: 01/14/2022 17:58   IR US Guide Vasc Access Right  Result Date: 01/17/2022 CLINICAL DATA:  History of transient ischemic attack comprising of right hand weakness with slurred speech evaluation of the brain revealed numerous diffusion-weighted abnormalities in the left middle cerebral artery distribution. CT of the head and neck revealed left internal carotid artery proximal occlusion and questionable  string sign. EXAM: BILATERAL COMMON CAROTID AND INNOMINATE ANGIOGRAPHY COMPARISON:  CT angiogram of the head and neck of January 14, 2022. MEDICATIONS: Heparin 2000 units IV. No antibiotic was administered within 1 hour of the procedure. ANESTHESIA/SEDATION: Versed 1 mg IV; Fentanyl 25 mcg IV Moderate Sedation Time:  35 minutes The patient was continuously monitored during the procedure by the  interventional radiology nurse under my direct supervision. CONTRAST:  Omnipaque 300 approximately 65 mL. FLUOROSCOPY TIME:  Fluoroscopy Time: 13 minutes 6 seconds (1170 mGy). COMPLICATIONS: None immediate. TECHNIQUE: Informed written consent was obtained from the patient after a thorough discussion of the procedural risks, benefits and alternatives. All questions were addressed. Maximal Sterile Barrier Technique was utilized including caps, mask, sterile gowns, sterile gloves, sterile drape, hand hygiene and skin antiseptic. A timeout was performed prior to the initiation of the procedure. The right forearm to the wrist was prepped and draped in usual sterile manner. The right radial artery was then identified with ultrasound, and its morphology documented permanently in the radiology PACS system. A dorsal palmar anastomosis was verified to be present. Using ultrasound guidance and micropuncture set access into the right radial artery was obtained with a 4/5 French radial sheath over a 0.018 inch micro guidewire. The guidewire and obturator were removed. Good aspiration was obtained from the side port of the radial sheath. A cocktail of 2000 units of heparin, 2.5 mg of verapamil, and 200 mcg of nitroglycerin was then infused in diluted form through sheath without event. A right radial arteriogram was performed. Over a 0.035 inch Roadrunner guidewire, a 5 [place] Simmons 2 diagnostic catheter was then advanced to the aortic arch region, selectively positioned in the origin of the right vertebral artery, the right common carotid artery, the left common carotid artery and the left vertebral artery. A wrist band was then applied for hemostasis at the right radial puncture site. Distal right radial pulse was verified to be present. FINDINGS: The right vertebral artery origin demonstrates a severe stenosis. More distally, the vessel is seen to opacify to the cranial skull base. There is approximately 50% stenosis of the  right vertebrobasilar junction just proximal to the origin of the right posterior-inferior cerebellar artery. Distal to this contrast is seen in the basilar artery and the posterior cerebral arteries. Mixed with unopacified blood is seen in the basilar artery from the contralateral vertebral artery. The right common carotid arteriogram demonstrates the right external carotid artery and its major branches to be widely patent. The right internal carotid artery has an eccentric smooth atherosclerotic plaque along the lateral aspect just distal to the bulb with approximately 20% stenosis by the NASCET criteria. More distally, the vessel is seen to opacify to the cranial skull base. Mild caliber irregularity probably related to arteriosclerosis is seen of the cavernous segment of the right internal carotid artery. The right middle cerebral artery and the right anterior cerebral artery opacify into the capillary and venous phases. Prompt opacification of the left anterior cerebral A2 segment and distally is seen via anterior communicating artery. A small infundibulum is evident in the right posterior communicating artery. The left common carotid arteriogram demonstrates the left external carotid artery and its major branches to be patent. Left internal carotid artery at the bulb and its proximal 1/3 demonstrates slow ascent of contrast to near complete occlusion. The delayed images demonstrated ascent of contrast to the distal cervical left ICA. More distally, reconstitution of the left internal carotid artery and the supraclinoid segment is noted from  left external carotid artery branches via the ipsilateral ophthalmic artery. Opacification of the right left middle cerebral artery distribution is evident. No opacification of the left anterior cerebral artery is evident. The left vertebral artery origin is widely patent. The vessel is seen to opacify to the cranial skull base. Patency is seen of the left vertebrobasilar  junction with a 50%-60% stenosis of the left vertebrobasilar junction just proximal to the origin of the left posterior-inferior cerebellar artery. Mild caliber irregularity of the proximal PICA suggests intracranial arteriosclerosis. More distally, opacification is seen of the basilar artery, the posterior cerebral arteries, the superior cerebellar arteries and the anterior-inferior cerebellar arteries into the capillary and venous phases. Unopacified blood is seen in the basilar artery from the contralateral vertebral artery. 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 Partial reconstitution of the left internal carotid artery supraclinoid segment from the ipsilateral ophthalmic artery. Non opacification of the left anterior cerebral artery A1 segment. 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 Approximately 50% to 60% stenosis of the dominant left vertebrobasilar junction just proximal to the left posterior-inferior cerebellar artery. Approximately 50% stenosis of the right vertebrobasilar junction proximal to the right posteroinferior cerebellar artery. PLAN: Finding discussed with the patient and the referring neurologist. Electronically Signed   By: Luanne Bras M.D.   On: 01/17/2022 08:16   DG Chest Port 1 View  Result Date: 01/15/2022 CLINICAL DATA:  Weakness. EXAM: PORTABLE CHEST 1 VIEW COMPARISON:  09/06/2021 FINDINGS: Dialysis catheter with tip at the upper cavoatrial junction. There is no edema, consolidation, effusion, or pneumothorax. Artifact from EKG leads. Normal heart size and mediastinal contours. IMPRESSION: No evidence of active disease. Electronically Signed   By: Jorje Guild M.D.   On: 01/15/2022 07:43   ECHOCARDIOGRAM COMPLETE  Result Date: 01/16/2022    ECHOCARDIOGRAM REPORT   Patient Name:   Caleb Simmons Date of Exam: 01/16/2022  Medical Rec #:  [id number]    Height:       70.0 in Accession #:    [id number]   Weight:       163.1 lb Date of Birth:  [birth year]     BSA:          1.914 m Patient Age:    71 years     BP:           128/79 mmHg Patient Gender: M            HR:           83 bpm. Exam Location:  Inpatient Procedure: 2D Echo Indications:    Stroke  History:        Patient has prior history of Echocardiogram examinations, most                 recent 09/08/2021.  Sonographer:    [name] Referring Phys: [place]  1. Left ventricular ejection fraction, by estimation, is 55 to 60%. The left ventricle has normal function. The left ventricle has no regional wall motion abnormalities. There is mild left ventricular hypertrophy. Left ventricular diastolic parameters are consistent with Grade I diastolic dysfunction (impaired relaxation).  2. Right ventricular systolic function is normal. The right ventricular size is normal. Tricuspid regurgitation signal is inadequate for assessing PA pressure.  3. The mitral valve is normal in structure. Trivial mitral valve regurgitation. No evidence of mitral  stenosis.  4. The aortic valve is tricuspid. Aortic valve regurgitation is not visualized. Aortic valve sclerosis/calcification is present, without any evidence of aortic stenosis.  5. Aortic dilatation noted. There is mild dilatation of the ascending aorta, measuring 38 mm.  6. The inferior vena cava is normal in size with greater than 50% respiratory variability, suggesting right atrial pressure of 3 mmHg.  7. A small pericardial effusion is present. FINDINGS  Left Ventricle: Left ventricular ejection fraction, by estimation, is 55 to 60%. The left ventricle has normal function. The left ventricle has no regional wall motion abnormalities. The left ventricular internal cavity size was normal in size. There is  mild left ventricular hypertrophy. Left ventricular diastolic parameters are consistent with Grade I diastolic  dysfunction (impaired relaxation). Right Ventricle: The right ventricular size is normal. No increase in right ventricular wall thickness. Right ventricular systolic function is normal. Tricuspid regurgitation signal is inadequate for assessing PA pressure. Left Atrium: Left atrial size was normal in size. Right Atrium: Right atrial size was normal in size. Pericardium: A small pericardial effusion is present. Mitral Valve: The mitral valve is normal in structure. There is mild calcification of the mitral valve leaflet(s). Mild mitral annular calcification. Trivial mitral valve regurgitation. No evidence of mitral valve stenosis. Tricuspid Valve: The tricuspid valve is normal in structure. Tricuspid valve regurgitation is not demonstrated. Aortic Valve: The aortic valve is tricuspid. Aortic valve regurgitation is not visualized. Aortic valve sclerosis/calcification is present, without any evidence of aortic stenosis. Aortic valve mean gradient measures 5.0 mmHg. Aortic valve peak gradient measures 9.0 mmHg. Aortic valve area, by VTI measures 1.94 cm. Pulmonic Valve: The pulmonic valve was normal in structure. Pulmonic valve regurgitation is not visualized. Aorta: The aortic root is normal in size and structure and aortic dilatation noted. There is mild dilatation of the ascending aorta, measuring 38 mm. Venous: The inferior vena cava is normal in size with greater than 50% respiratory variability, suggesting right atrial pressure of 3 mmHg. IAS/Shunts: No atrial level shunt detected by color flow Doppler.  LEFT VENTRICLE PLAX 2D LVIDd:         5.00 cm   Diastology LVIDs:         3.80 cm   LV e' medial:    5.77 cm/s LV PW:         1.20 cm   LV E/e' medial:  8.4 LV IVS:        1.20 cm   LV e' lateral:   12.10 cm/s LVOT diam:     2.00 cm   LV E/e' lateral: 4.0 LV SV:         42 LV SV Index:   22 LVOT Area:     3.14 cm  RIGHT VENTRICLE RV Basal diam:  3.30 cm RV Mid diam:    2.50 cm RV S prime:     13.60 cm/s TAPSE  (M-mode): 1.7 cm LEFT ATRIUM             Index        RIGHT ATRIUM           Index LA diam:        3.30 cm 1.72 cm/m   RA Area:     13.50 cm LA Vol (A2C):   42.2 ml 22.05 ml/m  RA Volume:   31.50 ml  16.46 ml/m LA Vol (A4C):   49.4 ml 25.81 ml/m LA Biplane Vol: 46.0 ml 24.03 ml/m  AORTIC VALVE AV Area (Vmax):  2.08 cm AV Area (Vmean):   1.97 cm AV Area (VTI):     1.94 cm AV Vmax:           150.00 cm/s AV Vmean:          99.300 cm/s AV VTI:            0.219 m AV Peak Grad:      9.0 mmHg AV Mean Grad:      5.0 mmHg LVOT Vmax:         99.25 cm/s LVOT Vmean:        62.150 cm/s LVOT VTI:          0.135 m LVOT/AV VTI ratio: 0.62  AORTA Ao Root diam: 3.40 cm Ao Asc diam:  3.80 cm MITRAL VALVE MV Area (PHT): 4.57 cm    SHUNTS MV Decel Time: 166 msec    Systemic VTI:  0.14 m MV E velocity: 48.40 cm/s  Systemic Diam: 2.00 cm MV A velocity: 67.70 cm/s MV E/A ratio:  0.71 Dalton McleanMD Electronically signed by Franki Monte Signature Date/Time: 01/16/2022/5:45:45 PM    Final    DG HIP UNILAT WITH PELVIS 2-3 VIEWS LEFT  Result Date: 01/16/2022 CLINICAL DATA:  Left hip pain.  No injury. EXAM: DG HIP (WITH OR WITHOUT PELVIS) 2-3V LEFT COMPARISON:  Hip x-ray 01/14/2022. FINDINGS: There is no evidence of hip fracture or dislocation. There is no evidence of arthropathy or other focal bone abnormality. IMPRESSION: Negative. Electronically Signed   By: Ronney Asters M.D.   On: 01/16/2022 19:24   DG HIPS BILAT WITH PELVIS MIN 5 VIEWS  Result Date: 01/14/2022 CLINICAL DATA:  Bilateral hip pain EXAM: DG HIP (WITH OR WITHOUT PELVIS) 5+V BILAT COMPARISON:  None. FINDINGS: There is no evidence of hip fracture or dislocation. There is no evidence of arthropathy or other focal bone abnormality. IMPRESSION: Negative. Electronically Signed   By: Fidela Salisbury M.D.   On: 01/14/2022 21:57   IR ANGIO INTRA EXTRACRAN SEL COM CAROTID INNOMINATE BILAT MOD SED  Result Date: 01/17/2022 CLINICAL DATA:  History of transient  ischemic attack comprising of right hand weakness with slurred speech evaluation of the brain revealed numerous diffusion-weighted abnormalities in the left middle cerebral artery distribution. CT of the head and neck revealed left internal carotid artery proximal occlusion and questionable string sign. EXAM: BILATERAL COMMON CAROTID AND INNOMINATE ANGIOGRAPHY COMPARISON:  CT angiogram of the head and neck of January 14, 2022. MEDICATIONS: Heparin 2000 units IV. No antibiotic was administered within 1 hour of the procedure. ANESTHESIA/SEDATION: Versed 1 mg IV; Fentanyl 25 mcg IV Moderate Sedation Time:  35 minutes The patient was continuously monitored during the procedure by the interventional radiology nurse under my direct supervision. CONTRAST:  Omnipaque 300 approximately 65 mL. FLUOROSCOPY TIME:  Fluoroscopy Time: 13 minutes 6 seconds (1170 mGy). COMPLICATIONS: None immediate. TECHNIQUE: Informed written consent was obtained from the patient after a thorough discussion of the procedural risks, benefits and alternatives. All questions were addressed. Maximal Sterile Barrier Technique was utilized including caps, mask, sterile gowns, sterile gloves, sterile drape, hand hygiene and skin antiseptic. A timeout was performed prior to the initiation of the procedure. The right forearm to the wrist was prepped and draped in usual sterile manner. The right radial artery was then identified with ultrasound, and its morphology documented permanently in the radiology PACS system. A dorsal palmar anastomosis was verified to be present. Using ultrasound guidance and micropuncture set access into the right radial artery was  obtained with a 4/5 French radial sheath over a 0.018 inch micro guidewire. The guidewire and obturator were removed. Good aspiration was obtained from the side port of the radial sheath. A cocktail of 2000 units of heparin, 2.5 mg of verapamil, and 200 mcg of nitroglycerin was then infused in diluted  form through sheath without event. A right radial arteriogram was performed. Over a 0.035 inch Roadrunner guidewire, a 5 [place] Simmons 2 diagnostic catheter was then advanced to the aortic arch region, selectively positioned in the origin of the right vertebral artery, the right common carotid artery, the left common carotid artery and the left vertebral artery. A wrist band was then applied for hemostasis at the right radial puncture site. Distal right radial pulse was verified to be present. FINDINGS: The right vertebral artery origin demonstrates a severe stenosis. More distally, the vessel is seen to opacify to the cranial skull base. There is approximately 50% stenosis of the right vertebrobasilar junction just proximal to the origin of the right posterior-inferior cerebellar artery. Distal to this contrast is seen in the basilar artery and the posterior cerebral arteries. Mixed with unopacified blood is seen in the basilar artery from the contralateral vertebral artery. The right common carotid arteriogram demonstrates the right external carotid artery and its major branches to be widely patent. The right internal carotid artery has an eccentric smooth atherosclerotic plaque along the lateral aspect just distal to the bulb with approximately 20% stenosis by the NASCET criteria. More distally, the vessel is seen to opacify to the cranial skull base. Mild caliber irregularity probably related to arteriosclerosis is seen of the cavernous segment of the right internal carotid artery. The right middle cerebral artery and the right anterior cerebral artery opacify into the capillary and venous phases. Prompt opacification of the left anterior cerebral A2 segment and distally is seen via anterior communicating artery. A small infundibulum is evident in the right posterior communicating artery. The left common carotid arteriogram demonstrates the left external carotid artery and its major branches to be patent. Left  internal carotid artery at the bulb and its proximal 1/3 demonstrates slow ascent of contrast to near complete occlusion. The delayed images demonstrated ascent of contrast to the distal cervical left ICA. More distally, reconstitution of the left internal carotid artery and the supraclinoid segment is noted from left external carotid artery branches via the ipsilateral ophthalmic artery. Opacification of the right left middle cerebral artery distribution is evident. No opacification of the left anterior cerebral artery is evident. The left vertebral artery origin is widely patent. The vessel is seen to opacify to the cranial skull base. Patency is seen of the left vertebrobasilar junction with a 50%-60% stenosis of the left vertebrobasilar junction just proximal to the origin of the left posterior-inferior cerebellar artery. Mild caliber irregularity of the proximal PICA suggests intracranial arteriosclerosis. More distally, opacification is seen of the basilar artery, the posterior cerebral arteries, the superior cerebellar arteries and the anterior-inferior cerebellar arteries into the capillary and venous phases. Unopacified blood is seen in the basilar artery from the contralateral vertebral artery. 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 Partial reconstitution of the left internal carotid artery supraclinoid segment from the ipsilateral ophthalmic artery. Non opacification of the left anterior cerebral artery A1 segment. 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 Approximately 50% to 60% stenosis of the dominant left  vertebrobasilar junction just proximal to the left posterior-inferior cerebellar artery. Approximately 50% stenosis of the right vertebrobasilar junction proximal to the right posteroinferior cerebellar artery. PLAN: Finding discussed with the  patient and the referring neurologist. Electronically Signed   By: Luanne Bras M.D.   On: 01/17/2022 08:16   IR ANGIO VERTEBRAL SEL SUBCLAVIAN INNOMINATE BILAT MOD SED  Result Date: 01/17/2022 CLINICAL DATA:  History of transient ischemic attack comprising of right hand weakness with slurred speech evaluation of the brain revealed numerous diffusion-weighted abnormalities in the left middle cerebral artery distribution. CT of the head and neck revealed left internal carotid artery proximal occlusion and questionable string sign. EXAM: BILATERAL COMMON CAROTID AND INNOMINATE ANGIOGRAPHY COMPARISON:  CT angiogram of the head and neck of January 14, 2022. MEDICATIONS: Heparin 2000 units IV. No antibiotic was administered within 1 hour of the procedure. ANESTHESIA/SEDATION: Versed 1 mg IV; Fentanyl 25 mcg IV Moderate Sedation Time:  35 minutes The patient was continuously monitored during the procedure by the interventional radiology nurse under my direct supervision. CONTRAST:  Omnipaque 300 approximately 65 mL. FLUOROSCOPY TIME:  Fluoroscopy Time: 13 minutes 6 seconds (1170 mGy). COMPLICATIONS: None immediate. TECHNIQUE: Informed written consent was obtained from the patient after a thorough discussion of the procedural risks, benefits and alternatives. All questions were addressed. Maximal Sterile Barrier Technique was utilized including caps, mask, sterile gowns, sterile gloves, sterile drape, hand hygiene and skin antiseptic. A timeout was performed prior to the initiation of the procedure. The right forearm to the wrist was prepped and draped in usual sterile manner. The right radial artery was then identified with ultrasound, and its morphology documented permanently in the radiology PACS system. A dorsal palmar anastomosis was verified to be present. Using ultrasound guidance and micropuncture set access into the right radial artery was obtained with a 4/5 French radial sheath over a 0.018 inch micro  guidewire. The guidewire and obturator were removed. Good aspiration was obtained from the side port of the radial sheath. A cocktail of 2000 units of heparin, 2.5 mg of verapamil, and 200 mcg of nitroglycerin was then infused in diluted form through sheath without event. A right radial arteriogram was performed. Over a 0.035 inch Roadrunner guidewire, a 5 [place] Simmons 2 diagnostic catheter was then advanced to the aortic arch region, selectively positioned in the origin of the right vertebral artery, the right common carotid artery, the left common carotid artery and the left vertebral artery. A wrist band was then applied for hemostasis at the right radial puncture site. Distal right radial pulse was verified to be present. FINDINGS: The right vertebral artery origin demonstrates a severe stenosis. More distally, the vessel is seen to opacify to the cranial skull base. There is approximately 50% stenosis of the right vertebrobasilar junction just proximal to the origin of the right posterior-inferior cerebellar artery. Distal to this contrast is seen in the basilar artery and the posterior cerebral arteries. Mixed with unopacified blood is seen in the basilar artery from the contralateral vertebral artery. The right common carotid arteriogram demonstrates the right external carotid artery and its major branches to be widely patent. The right internal carotid artery has an eccentric smooth atherosclerotic plaque along the lateral aspect just distal to the bulb with approximately 20% stenosis by the NASCET criteria. More distally, the vessel is seen to opacify to the cranial skull base. Mild caliber irregularity probably related to arteriosclerosis is seen of the cavernous segment of the right internal carotid artery. The right middle  cerebral artery and the right anterior cerebral artery opacify into the capillary and venous phases. Prompt opacification of the left anterior cerebral A2 segment and distally is  seen via anterior communicating artery. A small infundibulum is evident in the right posterior communicating artery. The left common carotid arteriogram demonstrates the left external carotid artery and its major branches to be patent. Left internal carotid artery at the bulb and its proximal 1/3 demonstrates slow ascent of contrast to near complete occlusion. The delayed images demonstrated ascent of contrast to the distal cervical left ICA. More distally, reconstitution of the left internal carotid artery and the supraclinoid segment is noted from left external carotid artery branches via the ipsilateral ophthalmic artery. Opacification of the right left middle cerebral artery distribution is evident. No opacification of the left anterior cerebral artery is evident. The left vertebral artery origin is widely patent. The vessel is seen to opacify to the cranial skull base. Patency is seen of the left vertebrobasilar junction with a 50%-60% stenosis of the left vertebrobasilar junction just proximal to the origin of the left posterior-inferior cerebellar artery. Mild caliber irregularity of the proximal PICA suggests intracranial arteriosclerosis. More distally, opacification is seen of the basilar artery, the posterior cerebral arteries, the superior cerebellar arteries and the anterior-inferior cerebellar arteries into the capillary and venous phases. Unopacified blood is seen in the basilar artery from the contralateral vertebral artery. 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 Partial reconstitution of the left internal carotid artery supraclinoid segment from the ipsilateral ophthalmic artery. Non opacification of the left anterior cerebral artery A1 segment. 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 Approximately 50% to 60% stenosis of the dominant left  vertebrobasilar junction just proximal to the left posterior-inferior cerebellar artery. Approximately 50% stenosis of the right vertebrobasilar junction proximal to the right posteroinferior cerebellar artery. PLAN: Finding discussed with the patient and the referring neurologist. Electronically Signed   By: Luanne Bras M.D.   On: 01/17/2022 08:16      PHYSICAL EXAM  Temp:  [97 F (36.1 C)-100.1 F (37.8 C)] 97.5 F (36.4 C) (01/28 0800) Pulse Rate:  [75-92] 82 (01/28 1000) Resp:  [4-25] 15 (01/28 1000) BP: (92-130)/(53-85) 115/62 (01/28 1000) SpO2:  [97 %-100 %] 97 % (01/28 1000) Arterial Line BP: (113-166)/(47-68) 144/55 (01/28 0900)  General - Well nourished, well developed, in no apparent distress.  Ophthalmologic - fundi not visualized due to noncooperation.  Cardiovascular - Regular rhythm and rate.  Mental Status -  Level of arousal and orientation to time, place, and person were intact. Language including expression, naming, repetition, comprehension was assessed and found intact. Fund of Knowledge was assessed and was intact.  Cranial Nerves II - XII - II - Visual field intact OU. III, IV, VI - Extraocular movements intact. V - Facial sensation intact bilaterally. VII - Facial movement intact bilaterally. VIII - Hearing & vestibular intact bilaterally. X - Palate elevates symmetrically. XI - Chin turning & shoulder shrug intact bilaterally. XII - Tongue protrusion intact.  Motor Strength - The patients strength was normal in all extremities and pronator drift was absent.  Bulk was normal and fasciculations were absent.   Motor Tone - Muscle tone was assessed at the neck and appendages and was normal.  Reflexes - The patients reflexes were symmetrical in all extremities and he had no pathological reflexes.  Sensory -  Light touch, temperature/pinprick were assessed and were symmetrical.    Coordination - The patient had normal movements in the hands and  feet with no ataxia or dysmetria.  Tremor was absent.  Gait and Station - deferred.   ASSESSMENT/PLAN [name] is a 71 y.o. 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 No tPA given due to OOW.  Endovascular procedure 1/27 with Dr. Estanislado Pandy - s/p Complete revascularization achieved of the ICA to the ophthalmic seg with x 1 pass with solitaire x24mmx 40 mm retriever and aspiration and x 2 passes with embotrap 6.3 mmx 57 mm. 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 On ASA and brilinta for 30 days and the ASA alone. Continue HD inpatient.   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  Left A1 occlusion. MRI left MCA scattered infarcts Cerebral angiogram Occluded left ICA prox with a delayed  string sign. Partial reconstitution of left ICA supraclinoid from left ECA branches via left Ophthalmic A.  Severe proximal right VA origin stenosis, 50% bilateral VB junction stenosis. 2D Echo EF 55 to 60% LDL 85.5, TG 535 HgbA1c 5.6 SCDs for VTE prophylaxis No antithrombotic prior to admission, now on aspirin 81 and Brilinta 90 twice daily for 30 days and then aspirin alone. Patient counseled to be compliant with his antithrombotic medications Ongoing aggressive stroke risk factor management Therapy recommendations: None Disposition: Pending  Carotid stenosis/occlusion CTA head and neck left ICA occlusion with reconstituted at terminal, moderate stenosis right ICA siphon, Cerebral angiogram Occluded left ICA prox with a delayed  string sign. Partial  reconstitution of left ICA supraclinoid from left ECA branches via left Ophthalmic A.   Endovascular procedure 1/27 with Dr. Estanislado Pandy - s/p Complete revascularization achieved of the ICA to the ophthalmic seg with x 1 pass with solitaire x36mmx 40 mm retriever and aspiration and x 2 passes with embotrap 6.3 mmx 57 mm. 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 Continue aspirin 81 and Brilinta for 30 days and then aspirin alone.  AL amyloidosis Follows with Dr. Burr Medico at cancer center On chemotherapy Has renal, heart and neuro involvement ESRD on hemodialysis Orthostatic hypotension on midodrine Hx of CHF and mild cardiomyopathy EKG showed frequent PACs and tele showed frequent ectopy, no afib found  BP management Stable Avoid low BP Long term BP goal 130-150 given left ICA occlusion   Hyperlipidemia Home meds: None LDL 85.5, goal < 70 Now on Lipitor 40 Continue statin at discharge  Tobacco abuse Current smoker Smoking cessation counseling provided Pt will consider to quit  Other Stroke Risk Factors Advanced age  Other Active Problems Orthostatic hypotension on midodrine ESRD on HD Missed dialysis yesterday, plan for it today Anemia of chronic disease, hemoglobin 9.4->8.2 Hgb drop to 6.7 then 6.6 on redraw. Transfuse 1 unit per hospitalist team.   Hospital day # 5  ATTENDING ATTESTATION:  Left MCA scattered infarcts due to left ICA string sign with unsuccessful intervention. Neurologically stable from yesterday, see exam above.  Low Hg on repeat labs, tranfuse PRBC 1 unit. CT abd/pelvis for hematoma. He and his wife wanted to leave today and are not happy with having to stay another day. Con't aspirin 81mg  and brilinta 90mg  BID for 30 days then aspirin alone. Follow up with IR outpt for repeat CTA.  Will sign off. Call with questions.   Dr. Reeves Forth  evaluated pt independently, reviewed imaging, chart, labs. Discussed and formulated plan with the APP. Please see APP note above for details.      This patient is critically ill due to  stroke s/p intervention for ICA stenosis and at significant risk of neurological worsening, death form heart failure, respiratory failure, recurrent stroke, bleeding from [facility], seizure, sepsis. This patient's care requires constant monitoring of vital signs, hemodynamics, respiratory and cardiac monitoring, review of multiple databases, neurological assessment, discussion with family, other specialists and medical decision making of high complexity. I spent 35 minutes of neurocritical care time in the care of this patient.   Nyhla Mountjoy,MD     To contact Stroke Continuity provider, please refer to [email or web address] After hours, contact General Neurology

## 2022-01-19 NOTE — Progress Notes (Signed)
Hemoglobin 6.7,  Temp 100.1, on call paged regarding these values, awaiting feedback. No signs of any bleeding noted from the pt

## 2022-01-20 DIAGNOSIS — N400 Enlarged prostate without lower urinary tract symptoms: Secondary | ICD-10-CM

## 2022-01-20 DIAGNOSIS — Z95828 Presence of other vascular implants and grafts: Secondary | ICD-10-CM

## 2022-01-20 LAB — CBC
HCT: 23.4 % — ABNORMAL LOW (ref 39.0–52.0)
Hemoglobin: 7.8 g/dL — ABNORMAL LOW (ref 13.0–17.0)
MCH: 30.4 pg (ref 26.0–34.0)
MCHC: 33.3 g/dL (ref 30.0–36.0)
MCV: 91.1 fL (ref 80.0–100.0)
Platelets: 178 10*3/uL (ref 150–400)
RBC: 2.57 MIL/uL — ABNORMAL LOW (ref 4.22–5.81)
RDW: 17.2 % — ABNORMAL HIGH (ref 11.5–15.5)
WBC: 6.6 10*3/uL (ref 4.0–10.5)
nRBC: 0 % (ref 0.0–0.2)

## 2022-01-20 LAB — RENAL FUNCTION PANEL
Albumin: 2.6 g/dL — ABNORMAL LOW (ref 3.5–5.0)
Anion gap: 8 (ref 5–15)
BUN: 25 mg/dL — ABNORMAL HIGH (ref 8–23)
CO2: 22 mmol/L (ref 22–32)
Calcium: 7.8 mg/dL — ABNORMAL LOW (ref 8.9–10.3)
Chloride: 106 mmol/L (ref 98–111)
Creatinine, Ser: 3.63 mg/dL — ABNORMAL HIGH (ref 0.61–1.24)
GFR, Estimated: 17 mL/min — ABNORMAL LOW (ref >60)
Glucose, Bld: 92 mg/dL (ref 70–99)
Phosphorus: 4.9 mg/dL — ABNORMAL HIGH (ref 2.5–4.6)
Potassium: 4.3 mmol/L (ref 3.5–5.1)
Sodium: 136 mmol/L (ref 135–145)

## 2022-01-20 LAB — MAGNESIUM: Magnesium: 1.8 mg/dL (ref 1.7–2.4)

## 2022-01-20 MED ORDER — ASPIRIN 81 MG PO TBEC: 81.0000 mg | DELAYED_RELEASE_TABLET | Freq: Every day | ORAL | 11 refills | Status: AC

## 2022-01-20 MED ORDER — ASPIRIN 81 MG PO TBEC: 81.0000 mg | DELAYED_RELEASE_TABLET | Freq: Every day | ORAL | 11 refills | Status: DC

## 2022-01-20 MED ORDER — ATORVASTATIN CALCIUM 40 MG PO TABS
40.0000 mg | ORAL_TABLET | Freq: Every day | ORAL | 1 refills | Status: DC
  Filled 2022-01-20: qty 90, 90d supply, fill #0

## 2022-01-20 MED ORDER — ATORVASTATIN CALCIUM 40 MG PO TABS: 40.0000 mg | ORAL_TABLET | Freq: Every day | ORAL | 1 refills | Status: DC

## 2022-01-20 MED ORDER — CALCITRIOL 0.25 MCG PO CAPS
0.2500 ug | ORAL_CAPSULE | ORAL | 1 refills | Status: DC
  Filled 2022-01-20: qty 30, 70d supply, fill #0

## 2022-01-20 MED ORDER — CALCITRIOL 0.25 MCG PO CAPS: 0.2500 ug | ORAL_CAPSULE | ORAL | 1 refills | Status: DC

## 2022-01-20 MED ORDER — TICAGRELOR 90 MG PO TABS
90.0000 mg | ORAL_TABLET | Freq: Two times a day (BID) | ORAL | 0 refills | Status: DC
  Filled 2022-01-20: qty 60, 30d supply, fill #0

## 2022-01-20 MED ORDER — TICAGRELOR 90 MG PO TABS: 90.0000 mg | ORAL_TABLET | Freq: Two times a day (BID) | ORAL | 0 refills | Status: DC

## 2022-01-20 NOTE — Anesthesia Postprocedure Evaluation (Signed)
Anesthesia Post Note  Patient: Caleb Simmons  Procedure(s) Performed: Cerebral angioplasty with possible stenting     Patient location during evaluation: PACU Anesthesia Type: General Level of consciousness: awake and alert Pain management: pain level controlled Vital Signs Assessment: post-procedure vital signs reviewed and stable Respiratory status: spontaneous breathing, nonlabored ventilation, respiratory function stable and patient connected to nasal cannula oxygen Cardiovascular status: blood pressure returned to baseline and stable Postop Assessment: no apparent nausea or vomiting Anesthetic complications: no   No notable events documented.  Last Vitals:  Vitals:   01/20/22 0600 01/20/22 0800  BP: (!) 142/79 (!) 109/59  Pulse: 96 75  Resp: (!) 22   Temp:  36.5 C  SpO2: 100% 93%    Last Pain:  Vitals:   01/20/22 0800  TempSrc: Oral  PainSc:                  Laquinton Bihm

## 2022-01-20 NOTE — Discharge Summary (Signed)
Physician Discharge Summary  AUGUSTINE LEVERETTE OBS:[id number] DOB: [birth year] DOA: 01/14/2022  PCP: Pcp, No  Admit date: 01/14/2022 Discharge date: 01/20/2022 Admitted From: Home Disposition: Home Recommendations for Outpatient Follow-up:  Follow ups as below. Please obtain CBC/BMP/Mag at follow up Please follow up on the following pending results: None Home Health: Not indicated Equipment/Devices: Not indicated Discharge Condition: Stable CODE STATUS: Full code  Follow-up [place]. Schedule an appointment as soon as possible for a visit in 1 week(s).   Specialty: Rehabilitation Contact information: [address] [place] [id number] [place] [address] [phone]        Luanne Bras, MD Follow up in 3 week(s).   Specialties: Interventional Radiology, Radiology Why: please call [phone] if any questions/concerns.  Pt will hear from scheduler for follow up with Dr Estanislado Pandy for 3 weeks.  Will need CTA with contrast 8 weeks Contact information: [place],  [place] [address] [place] [address] [phone]         [facility] Follow up in 4 week(s).   Specialty: Neurology Contact information: [place] Hospital Course: 71 year old M with PMH of amyloidosis, plasma cell myeloma, ESRD on HD MWF, tobacco use disorder, combined CHF and osteoarthritis presented to [facility] ED on 1/23 with facial droop and slurred speech, and found to have left MCA CVA, likely watershed from hypoperfusion in the setting of left ICA stenosis.  Patient was transferred to [facility] on 1/24.  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  IR recommended CTA head and neck in 8 weeks.  Hospital service assumed care on 1/28.  Patient had 1 unit of blood transfusion with appropriate response for anemia.   On the day of discharge, no focal neurodeficit.  Feels well and ready to go home.  Neurology recommended DAPT with Brilinta and aspirin for 1 month followed by aspirin alone.  See individual problem list below for more on hospital course.  Discharge Diagnoses:  Acute ischemic left MCA CVA, POA Left ICA occlusion/high-grade stenosis -S/p attempted left ICA revascularization by neuro IR, who recommends outpatient follow-up in 8 wks for CTA -Neurology recommended aspirin and Brilinta for 30 days followed by aspirin alone -Long-term BP goal 130-150 SBP given left ICA occlusion -Continue statin   ESRD on HD MWF- -HD per nephrology   AL Amyloidosis/plasma cell myeloma: Followed by Dr.  Burr Medico.  Started chemo on 9/19. -Plan 6 month treatment. -Outpatient follow-up with oncology   Chronic combined CHF: Stable.   Chronic orthostatic hypotension: Soft blood pressure this morning.  Not symptomatic. -Midodrine 5 mg p.o. 3 times daily   BPH: Tamsulosin 0.4 mg p.o. daily   Anemia of chronic disease: Patient denies melena or hematochezia.  No obvious signs of bleeding anywhere. CT abdomen and pelvis without acute finding. Patient was on IV fluid.  Transfused 1 unit with exaggerated response Recent Labs    01/03/22 0802 01/10/22 0744 01/14/22 1342 01/14/22 1349 01/16/22 0355 01/18/22 0329 01/19/22 0317 01/19/22 0842 01/19/22 1927 01/20/22 0315  HGB 10.1* 9.1* 9.4* 9.2* 8.5* 8.2* 6.7* 6.6* 8.3* 7.8*  -Recheck CBC at follow-up.   Tobacco use disorder -Counseled on  need for complete cessation.   Hyperkalemia: Resolved.   GERD:  -Continue PPI   Anxiety:  -Continue Xanax   Osteoarthritis/left hip pain -Scheduled Tylenol with as needed Oxy IR and hydromorphone -Advised to avoid  any over-the-counter pain medication other than plain Tylenol while on DAPT.   Malnutrition of moderate degree Body mass index is 23.41 kg/m.           Discharge Exam: Vitals:   01/20/22 0328 01/20/22 0400 01/20/22 0600 01/20/22 0800  BP:  128/76 (!) 142/79 (!) 109/59  Pulse:  70 96 75  Temp: 97.7 F (36.5 C)   97.7 F (36.5 C)  Resp:  15 (!) 22   Weight:      SpO2:  96% 100% 93%  TempSrc: Oral   Oral  BMI (Calculated):         GENERAL: No apparent distress.  Nontoxic. HEENT: MMM.  Vision and hearing grossly intact.  NECK: Supple.  No apparent JVD.  RESP:  No IWOB.  Fair aeration bilaterally. CVS:  RRR. Heart sounds normal.  ABD/GI/GU: Bowel sounds present. Soft. Non tender.  MSK/EXT:  Moves extremities. No apparent deformity. No edema.  SKIN: no apparent skin lesion or wound NEURO: Awake and alert.  Oriented appropriately.  No apparent focal neuro deficit. PSYCH: Calm. Normal affect.   Discharge Instructions  Discharge Instructions     Ambulatory referral to Speech Therapy   Complete by: As directed    Ambulatory referral to Speech Therapy   Complete by: As directed    Please call the SO: [phone]   Diet - low sodium heart healthy   Complete by: As directed    Discharge instructions   Complete by: As directed    It has been a pleasure taking care of you!  You were hospitalized due to acute stroke.  We have started you on medications to reduce your risk of another stroke in the future.  Please take your medications as prescribed.  Review your new medication list and the directions on your medications before you take them.  Follow-up with your primary care doctor in 1 to 2 weeks or sooner if needed. Follow-up with neurology in 4 to 6 weeks.  Follow-up with interventional radiology in 8 weeks.   Take care,   Increase activity slowly   Complete by: As directed    No wound care   Complete by: As directed       Allergies as of 01/20/2022   No Known  Allergies      Medication List     STOP taking these medications    amoxicillin-clavulanate 875-125 MG tablet Commonly known as: AUGMENTIN       TAKE these medications    acetaminophen 325 MG tablet Commonly known as: TYLENOL Take 2 tablets (650 mg total) by mouth every 6 (six) hours as needed for moderate pain (headache). What changed: reasons to take this   ALPRAZolam 0.5 MG tablet Commonly known as: XANAX TAKE 1 TABLET BY MOUTH 2 TIMES DAILY AS NEEDED FOR SLEEP OR ANXIETY What changed: See the new instructions.   aspirin 81 MG EC tablet Take 1 tablet (81 mg total) by mouth daily. Swallow whole.   atorvastatin 40 MG tablet Commonly known as: LIPITOR Take 1 tablet (40 mg total) by mouth at bedtime.   calcitRIOL 0.25 MCG capsule Commonly known as: ROCALTROL Take 1 capsule (0.25 mcg total) by mouth every Monday, Wednesday, and Friday with hemodialysis. Start taking on: January 21, 2022  cyclophosphamide 50 MG capsule Commonly known as: CYTOXAN Take 9 capsules (450 mg total) by mouth once a week. Take with food to minimize GI upset. Take early in the day and maintain hydration. What changed:  when to take this additional instructions   folic acid 1 MG tablet Commonly known as: FOLVITE TAKE 2 TABLETS BY MOUTH EVERY DAY What changed: when to take this   Melatonin 10 MG Tabs Take 10 mg by mouth at bedtime.   midodrine 5 MG tablet Commonly known as: PROAMATINE Take 1 tablet (5 mg total) by mouth 3 (three) times daily with meals.   mirtazapine 30 MG tablet Commonly known as: REMERON Take 1 tablet (30 mg total) by mouth at bedtime.   ondansetron 4 MG tablet Commonly known as: Zofran Take 1 tablet (4 mg total) by mouth every 6 (six) hours as needed for nausea or vomiting.   oxyCODONE 5 MG immediate release tablet Commonly known as: Oxy IR/ROXICODONE Take 1 tablet (5 mg total) by mouth every 6 (six) hours as needed for severe pain.   pantoprazole 40 MG  tablet Commonly known as: PROTONIX TAKE 1 TABLET BY MOUTH 2 TIMES DAILY BEFORE A meal What changed: See the new instructions.   prochlorperazine 5 MG tablet Commonly known as: COMPAZINE Take 1 tablet (5 mg total) by mouth every 6 (six) hours as needed for nausea or vomiting.   sevelamer carbonate 800 MG tablet Commonly known as: RENVELA Take 800 mg by mouth 3 (three) times daily with meals.   tamsulosin 0.4 MG Caps capsule Commonly known as: FLOMAX TAKE 1 CAPSULE BY MOUTH EVERY DAY What changed: when to take this   ticagrelor 90 MG Tabs tablet Commonly known as: BRILINTA Take 1 tablet (90 mg total) by mouth 2 (two) times daily.   zolpidem 5 MG tablet Commonly known as: AMBIEN Take 1 tablet (5 mg total) by mouth at bedtime as needed for sleep. What changed: when to take this        Consultations: Neurology Neuro IR  Procedures/Studies: Post Occluded RT ICA prox  to the cavernous seg. Distal reconstitution  via LT ophthalmic artery from multiple Lt ECA collaterals. Complete revascularization achieved of the ICA to the ophthalmic seg with x 1 pass with solitaire x24mmx 40 mm retriever and aspiration and x 2 passes with embotrap 6.3 mmx 57 mm .[name] Reocclusion due to severe prox Lt ICA cav segment ICAD stenosis associated with  a long dissection in the petrois seg despite attempted placement of  pipeline flow diveter. Post CT no ICH    CT ABDOMEN PELVIS WO CONTRAST  Result Date: 01/19/2022 CLINICAL DATA:  Dropping hemoglobin, rule out retroperitoneal bleed EXAM: CT ABDOMEN AND PELVIS WITHOUT CONTRAST TECHNIQUE: Multidetector CT imaging of the abdomen and pelvis was performed following the standard protocol without IV contrast. RADIATION DOSE REDUCTION: This exam was performed according to the departmental dose-optimization program which includes automated exposure control, adjustment of the mA and/or kV according to patient size and/or use of iterative reconstruction technique.  COMPARISON:  08/30/2021 FINDINGS: Lower chest:  No contributory findings. Hepatobiliary: No focal liver abnormality.No evidence of biliary obstruction or stone. Pancreas: Unremarkable. Spleen: Unremarkable. Adrenals/Urinary Tract: Negative adrenals. Atrophic kidneys which retain contrast from remote procedure. Small cystic density on the right. Embolization coils overlap the lower pole right kidney where there is some scar-like thinning. Decompressed bladder by a Foley catheter. Stomach/Bowel:  No obstruction. No visible bowel inflammation Vascular/Lymphatic: In diffuse atheromatous plaque. No mass or adenopathy. Reproductive:Dystrophic  prostate calcifications, incidental Other: No retroperitoneal or other hemorrhage noted. Musculoskeletal: Ordinary spinal degeneration but notable, albeit chronic, left paracentral herniation at T10-11, likely deforming the cord. IMPRESSION: 1. Negative for retroperitoneal hemorrhage or other acute finding. 2. Continued retention of contrast in the kidneys suggesting ATN. 3. Chronic findings are described above. Electronically Signed   By: Jorje Guild M.D.   On: 01/19/2022 06:44   CT Angio Head W/Cm &/Or Wo Cm  Result Date: 01/14/2022 CLINICAL DATA:  Right hand weakness, right-sided facial droop and slurred speech EXAM: CT ANGIOGRAPHY HEAD AND NECK TECHNIQUE: Multidetector CT imaging of the head and neck was performed using the standard protocol during bolus administration of intravenous contrast. Multiplanar CT image reconstructions and MIPs were obtained to evaluate the vascular anatomy. Carotid stenosis measurements (when applicable) are obtained utilizing NASCET criteria, using the distal internal carotid diameter as the denominator. RADIATION DOSE REDUCTION: This exam was performed according to the departmental dose-optimization program which includes automated exposure control, adjustment of the mA and/or kV according to patient size and/or use of iterative  reconstruction technique. CONTRAST:  114mL OMNIPAQUE IOHEXOL 350 MG/ML SOLN COMPARISON:  01/14/2022 CT head and MRI FINDINGS: CT HEAD FINDINGS Brain: No evidence of acute hemorrhage, cerebral edema, mass, mass effect, or midline shift. No hydrocephalus or extra-axial fluid collection. Periventricular white matter changes, likely the sequela of chronic small vessel ischemic disease. The acute infarcts seen on the same-day MRI are not significantly apparent on this CT. Vascular: No hyperdense vessel. Skull: Normal. Negative for fracture or focal lesion. Sinuses/Orbits: No acute finding. Other: The mastoid air cells are well aerated. CTA NECK FINDINGS Aortic arch: Standard branching. Imaged portion shows no evidence of aneurysm or dissection. No significant stenosis of the major arch vessel origins. Right carotid system: No evidence of dissection, stenosis (50% or greater) or occlusion. Left carotid system: Evaluation of the origin of the left common carotid artery is somewhat limited due to beam hardening artifact from the adjacent venous bolus, however there appears to be mild narrowing at the origin. 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 No distal reconstitution of the extracranial ICA. Vertebral arteries: Moderate narrowing at the origin of the right vertebral artery, secondary to noncalcified plaque. Focal moderate stenosis of the right V2 (series 12, image 248) at the level of C6, as well as in the right C5-C6 foramen, secondary to degenerative changes (series 12, image 242). The right vertebral artery is otherwise patent. The left vertebral artery demonstrates mild calcified narrowing in the V2 segment, but is otherwise patent. Codominant. Skeleton: Multilevel degenerative changes in the cervical spine, with trace retrolisthesis of C5 on C6. No acute osseous abnormality. Other neck: Negative. Upper chest: Dependent atelectasis. Review  of the MIP images confirms the above findings CTA HEAD FINDINGS Evaluation is somewhat limited by degree of venous contamination. Anterior circulation: The left intracranial internal carotid artery is non-opacified until the ophthalmic and supraclinoid portions (series 12, image 111). Calcifications in the right intracranial internal carotid artery, which cause moderate narrowing in the distal cavernous segment and proximal supraclinoid segment. Occlusion of the left A1 (series 12, image 100), with a small area of filling in the distal left A1, likely retrograde. Patent right A1. Normal anterior communicating artery. Anterior cerebral arteries are patent to their distal aspects. No M1 stenosis or occlusion. Normal MCA bifurcations. Distal MCA branches perfused and symmetric. Posterior circulation: Vertebral arteries patent to the vertebrobasilar junction, with mild calcifications  and irregularity in the right V4 segment, prior to the PICA takeoff (series 12, image 145), which causes mild-to-moderate narrowing. Mild to moderate narrowing in the left V4, proximal to the left PICA takeoff (series 12, image 144). Posterior inferior cerebral arteries patent bilaterally. Basilar patent to its distal aspect. Superior cerebellar arteries patent bilaterally. Bilateral P1 segments originate from the basilar artery. PCAs perfused to their distal aspects, with mild narrowing in the left P1/P2 and poor opacification of the distal left P2 and P3 segments. The bilateral posterior communicating arteries are not definitively seen. Venous sinuses: As permitted by contrast timing, patent. Anatomic variants: None significant Review of the MIP images confirms the above findings IMPRESSION: 1. 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 The left intracranial internal carotid artery reconstitutes in the ophthalmic and supraclinoid portions. 2. Moderate narrowing in the distal right cavernous ICA  and proximal supraclinoid segment. 3. Occlusion of the left A1.  The left ACA is otherwise patent. 4. Moderate stenosis at the origin of the right vertebral artery, with additional focal moderate stenosis of the right V2 at the level of C6, both secondary to atherosclerotic plaque and degenerative changes. Mild-to-moderate stenosis of the bilateral V4 segments. 5. Poor opacification of the distal left P2 and P3 segments. These results were called by telephone at the time of interpretation on 01/14/2022 at 8:48 pm to provider DR. DOUTOVA , who verbally acknowledged these results. Electronically Signed   By: Merilyn Baba M.D.   On: 01/14/2022 20:48   DG Chest 2 View  Result Date: 01/14/2022 CLINICAL DATA:  Facial droop EXAM: CHEST - 2 VIEW COMPARISON:  09/06/2021 FINDINGS: Right dialysis catheter remains in place, unchanged. Heart and mediastinal contours are within normal limits. No focal opacities or effusions. No acute bony abnormality. Old healed left rib fractures. IMPRESSION: No active cardiopulmonary disease. Electronically Signed   By: Rolm Baptise M.D.   On: 01/14/2022 19:34   CT HEAD WO CONTRAST  Result Date: 01/14/2022 CLINICAL DATA:  Facial droop and right arm weakness and numbness for the past 2 days. EXAM: CT HEAD WITHOUT CONTRAST TECHNIQUE: Contiguous axial images were obtained from the base of the skull through the vertex without intravenous contrast. RADIATION DOSE REDUCTION: This exam was performed according to the departmental dose-optimization program which includes automated exposure control, adjustment of the mA and/or kV according to patient size and/or use of iterative reconstruction technique. COMPARISON:  None. FINDINGS: Brain: Normal size and position of the ventricles. Minimal patchy white matter low density in both cerebral hemispheres. No intracranial hemorrhage, mass lesion or CT evidence of acute infarction. Vascular: No hyperdense vessel or unexpected calcification. Skull:  Normal. Negative for fracture or focal lesion. Sinuses/Orbits: Unremarkable. Other: None. IMPRESSION: 1. No acute abnormality. 2. Minimal chronic small vessel white matter ischemic changes in both cerebral hemispheres. These make it difficult to exclude a small acute subcortical infarct. If this is a continued clinical concern, this could be further evaluated with an MRI of the brain. Electronically Signed   By: Claudie Revering M.D.   On: 01/14/2022 14:17   CT Angio Neck W and/or Wo Contrast  Result Date: 01/14/2022 CLINICAL DATA:  Right hand weakness, right-sided facial droop and slurred speech EXAM: CT ANGIOGRAPHY HEAD AND NECK TECHNIQUE: Multidetector CT imaging of the head and neck was performed using the standard protocol during bolus administration of intravenous contrast. Multiplanar CT image reconstructions and MIPs were obtained to evaluate the vascular anatomy. Carotid stenosis measurements (  when applicable) are obtained utilizing NASCET criteria, using the distal internal carotid diameter as the denominator. RADIATION DOSE REDUCTION: This exam was performed according to the departmental dose-optimization program which includes automated exposure control, adjustment of the mA and/or kV according to patient size and/or use of iterative reconstruction technique. CONTRAST:  168mL OMNIPAQUE IOHEXOL 350 MG/ML SOLN COMPARISON:  01/14/2022 CT head and MRI FINDINGS: CT HEAD FINDINGS Brain: No evidence of acute hemorrhage, cerebral edema, mass, mass effect, or midline shift. No hydrocephalus or extra-axial fluid collection. Periventricular white matter changes, likely the sequela of chronic small vessel ischemic disease. The acute infarcts seen on the same-day MRI are not significantly apparent on this CT. Vascular: No hyperdense vessel. Skull: Normal. Negative for fracture or focal lesion. Sinuses/Orbits: No acute finding. Other: The mastoid air cells are well aerated. CTA NECK FINDINGS Aortic arch: Standard  branching. Imaged portion shows no evidence of aneurysm or dissection. No significant stenosis of the major arch vessel origins. Right carotid system: No evidence of dissection, stenosis (50% or greater) or occlusion. Left carotid system: Evaluation of the origin of the left common carotid artery is somewhat limited due to beam hardening artifact from the adjacent venous bolus, however there appears to be mild narrowing at the origin. 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 No distal reconstitution of the extracranial ICA. Vertebral arteries: Moderate narrowing at the origin of the right vertebral artery, secondary to noncalcified plaque. Focal moderate stenosis of the right V2 (series 12, image 248) at the level of C6, as well as in the right C5-C6 foramen, secondary to degenerative changes (series 12, image 242). The right vertebral artery is otherwise patent. The left vertebral artery demonstrates mild calcified narrowing in the V2 segment, but is otherwise patent. Codominant. Skeleton: Multilevel degenerative changes in the cervical spine, with trace retrolisthesis of C5 on C6. No acute osseous abnormality. Other neck: Negative. Upper chest: Dependent atelectasis. Review of the MIP images confirms the above findings CTA HEAD FINDINGS Evaluation is somewhat limited by degree of venous contamination. Anterior circulation: The left intracranial internal carotid artery is non-opacified until the ophthalmic and supraclinoid portions (series 12, image 111). Calcifications in the right intracranial internal carotid artery, which cause moderate narrowing in the distal cavernous segment and proximal supraclinoid segment. Occlusion of the left A1 (series 12, image 100), with a small area of filling in the distal left A1, likely retrograde. Patent right A1. Normal anterior communicating artery. Anterior cerebral arteries are patent to their distal  aspects. No M1 stenosis or occlusion. Normal MCA bifurcations. Distal MCA branches perfused and symmetric. Posterior circulation: Vertebral arteries patent to the vertebrobasilar junction, with mild calcifications and irregularity in the right V4 segment, prior to the PICA takeoff (series 12, image 145), which causes mild-to-moderate narrowing. Mild to moderate narrowing in the left V4, proximal to the left PICA takeoff (series 12, image 144). Posterior inferior cerebral arteries patent bilaterally. Basilar patent to its distal aspect. Superior cerebellar arteries patent bilaterally. Bilateral P1 segments originate from the basilar artery. PCAs perfused to their distal aspects, with mild narrowing in the left P1/P2 and poor opacification of the distal left P2 and P3 segments. The bilateral posterior communicating arteries are not definitively seen. Venous sinuses: As permitted by contrast timing, patent. Anatomic variants: None significant Review of the MIP images confirms the above findings IMPRESSION: 1. 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 The left intracranial  internal carotid artery reconstitutes in the ophthalmic and supraclinoid portions. 2. Moderate narrowing in the distal right cavernous ICA and proximal supraclinoid segment. 3. Occlusion of the left A1.  The left ACA is otherwise patent. 4. Moderate stenosis at the origin of the right vertebral artery, with additional focal moderate stenosis of the right V2 at the level of C6, both secondary to atherosclerotic plaque and degenerative changes. Mild-to-moderate stenosis of the bilateral V4 segments. 5. Poor opacification of the distal left P2 and P3 segments. These results were called by telephone at the time of interpretation on 01/14/2022 at 8:48 pm to provider DR. DOUTOVA , who verbally acknowledged these results. Electronically Signed   By: Merilyn Baba M.D.   On: 01/14/2022 20:48   MR BRAIN WO  CONTRAST  Result Date: 01/14/2022 CLINICAL DATA:  Neuro deficit, acute, stroke suspected. EXAM: MRI HEAD WITHOUT CONTRAST TECHNIQUE: Multiplanar, multiecho pulse sequences of the brain and surrounding structures were obtained without intravenous contrast. COMPARISON:  Head CT same day FINDINGS: Brain: Diffusion imaging shows multiple small acute infarctions within the left hemisphere scattered within the MCA territory which could either be due to micro embolic infarctions or watershed infarctions. Micro embolic infarctions are favored. No large confluent infarction. No mass effect or swelling. Elsewhere, the brainstem and cerebellum are normal. Cerebral hemispheres show mild to moderate chronic small-vessel ischemic changes of the white matter. No mass lesion, hemorrhage, hydrocephalus or extra-axial collection. Vascular: Major vessels at the base of the brain show flow. 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 This pattern could be due to micro embolic infarction or watershed infarction. No large confluent infarction. No mass effect or hemorrhage. Moderate chronic small-vessel ischemic changes elsewhere affecting the cerebral hemispheric white matter. Electronically Signed   By: Nelson Chimes M.D.   On: 01/14/2022 17:58   IR US Guide Vasc Access Right  Result Date: 01/17/2022 CLINICAL DATA:  History of transient ischemic attack comprising of right hand weakness with slurred speech evaluation of the brain revealed numerous diffusion-weighted abnormalities in the left middle cerebral artery distribution. CT of the head and neck revealed left internal carotid artery proximal occlusion and questionable string sign. EXAM: BILATERAL COMMON CAROTID AND INNOMINATE ANGIOGRAPHY COMPARISON:  CT angiogram of the head and neck of January 14, 2022. MEDICATIONS: Heparin 2000 units IV. No antibiotic was administered within 1 hour of the procedure. ANESTHESIA/SEDATION: Versed 1 mg IV; Fentanyl 25 mcg IV Moderate Sedation Time:  35 minutes The patient was continuously monitored during the procedure by the interventional radiology nurse under my direct supervision. CONTRAST:  Omnipaque 300 approximately 65 mL. FLUOROSCOPY TIME:  Fluoroscopy Time: 13 minutes 6 seconds (1170 mGy). COMPLICATIONS: None immediate. TECHNIQUE: Informed written consent was obtained from the patient after a thorough discussion of the procedural risks, benefits and alternatives. All questions were addressed. Maximal Sterile Barrier Technique was utilized including caps, mask, sterile gowns, sterile gloves, sterile drape, hand hygiene and skin antiseptic. A timeout was performed prior to the initiation of the procedure. The right forearm to the wrist was prepped and draped in usual sterile manner. The right radial artery was then identified with ultrasound, and its morphology documented permanently in the radiology PACS system. A dorsal palmar anastomosis was verified to be present. Using ultrasound guidance and micropuncture set access into the right radial artery was obtained with a 4/5 French radial sheath over  a 0.018 inch micro guidewire. The guidewire and obturator were removed. Good aspiration was obtained from the side port of the radial sheath. A cocktail of 2000 units of heparin, 2.5 mg of verapamil, and 200 mcg of nitroglycerin was then infused in diluted form through sheath without event. A right radial arteriogram was performed. Over a 0.035 inch Roadrunner guidewire, a 5 [place] Simmons 2 diagnostic catheter was then advanced to the aortic arch region, selectively positioned in the origin of the right vertebral artery, the right common carotid artery, the left common carotid artery and the left vertebral artery. A wrist band was then applied for hemostasis at the right  radial puncture site. Distal right radial pulse was verified to be present. FINDINGS: The right vertebral artery origin demonstrates a severe stenosis. More distally, the vessel is seen to opacify to the cranial skull base. There is approximately 50% stenosis of the right vertebrobasilar junction just proximal to the origin of the right posterior-inferior cerebellar artery. Distal to this contrast is seen in the basilar artery and the posterior cerebral arteries. Mixed with unopacified blood is seen in the basilar artery from the contralateral vertebral artery. The right common carotid arteriogram demonstrates the right external carotid artery and its major branches to be widely patent. The right internal carotid artery has an eccentric smooth atherosclerotic plaque along the lateral aspect just distal to the bulb with approximately 20% stenosis by the NASCET criteria. More distally, the vessel is seen to opacify to the cranial skull base. Mild caliber irregularity probably related to arteriosclerosis is seen of the cavernous segment of the right internal carotid artery. The right middle cerebral artery and the right anterior cerebral artery opacify into the capillary and venous phases. Prompt opacification of the left anterior cerebral A2 segment and distally is seen via anterior communicating artery. A small infundibulum is evident in the right posterior communicating artery. The left common carotid arteriogram demonstrates the left external carotid artery and its major branches to be patent. Left internal carotid artery at the bulb and its proximal 1/3 demonstrates slow ascent of contrast to near complete occlusion. The delayed images demonstrated ascent of contrast to the distal cervical left ICA. More distally, reconstitution of the left internal carotid artery and the supraclinoid segment is noted from left external carotid artery branches via the ipsilateral ophthalmic artery. Opacification of the right left  middle cerebral artery distribution is evident. No opacification of the left anterior cerebral artery is evident. The left vertebral artery origin is widely patent. The vessel is seen to opacify to the cranial skull base. Patency is seen of the left vertebrobasilar junction with a 50%-60% stenosis of the left vertebrobasilar junction just proximal to the origin of the left posterior-inferior cerebellar artery. Mild caliber irregularity of the proximal PICA suggests intracranial arteriosclerosis. More distally, opacification is seen of the basilar artery, the posterior cerebral arteries, the superior cerebellar arteries and the anterior-inferior cerebellar arteries into the capillary and venous phases. Unopacified blood is seen in the basilar artery from the contralateral vertebral artery. 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 Partial reconstitution of the left internal carotid artery supraclinoid segment from the ipsilateral ophthalmic artery. Non opacification of the left anterior cerebral artery A1 segment. 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 Approximately 50% to 60% stenosis of the dominant left vertebrobasilar junction just proximal to the left posterior-inferior  cerebellar artery. Approximately 50% stenosis of the right vertebrobasilar junction proximal to the right posteroinferior cerebellar artery. PLAN: Finding discussed with the patient and the referring neurologist. Electronically Signed   By: Luanne Bras M.D.   On: 01/17/2022 08:16   DG Chest Port 1 View  Result Date: 01/15/2022 CLINICAL DATA:  Weakness. EXAM: PORTABLE CHEST 1 VIEW COMPARISON:  09/06/2021 FINDINGS: Dialysis catheter with tip at the upper cavoatrial junction. There is no edema, consolidation, effusion, or pneumothorax. Artifact from EKG leads. Normal heart size and  mediastinal contours. IMPRESSION: No evidence of active disease. Electronically Signed   By: Jorje Guild M.D.   On: 01/15/2022 07:43   ECHOCARDIOGRAM COMPLETE  Result Date: 01/16/2022    ECHOCARDIOGRAM REPORT   Patient Name:   Caleb Simmons Date of Exam: 01/16/2022 Medical Rec #:  [id number]    Height:       70.0 in Accession #:    [id number]   Weight:       163.1 lb Date of Birth:  [birth year]     BSA:          1.914 m Patient Age:    71 years     BP:           128/79 mmHg Patient Gender: M            HR:           83 bpm. Exam Location:  Inpatient Procedure: 2D Echo Indications:    Stroke  History:        Patient has prior history of Echocardiogram examinations, most                 recent 09/08/2021.  Sonographer:    [name] Referring Phys: [place]  1. Left ventricular ejection fraction, by estimation, is 55 to 60%. The left ventricle has normal function. The left ventricle has no regional wall motion abnormalities. There is mild left ventricular hypertrophy. Left ventricular diastolic parameters are consistent with Grade I diastolic dysfunction (impaired relaxation).  2. Right ventricular systolic function is normal. The right ventricular size is normal. Tricuspid regurgitation signal is inadequate for assessing PA pressure.  3. The mitral valve is normal in structure. Trivial mitral valve regurgitation. No evidence of mitral stenosis.  4. The aortic valve is tricuspid. Aortic valve regurgitation is not visualized. Aortic valve sclerosis/calcification is present, without any evidence of aortic stenosis.  5. Aortic dilatation noted. There is mild dilatation of the ascending aorta, measuring 38 mm.  6. The inferior vena cava is normal in size with greater than 50% respiratory variability, suggesting right atrial pressure of 3 mmHg.  7. A small pericardial effusion is present. FINDINGS  Left Ventricle: Left ventricular ejection fraction, by estimation, is 55 to 60%. The left  ventricle has normal function. The left ventricle has no regional wall motion abnormalities. The left ventricular internal cavity size was normal in size. There is  mild left ventricular hypertrophy. Left ventricular diastolic parameters are consistent with Grade I diastolic dysfunction (impaired relaxation). Right Ventricle: The right ventricular size is normal. No increase in right ventricular wall thickness. Right ventricular systolic function is normal. Tricuspid regurgitation signal is inadequate for assessing PA pressure. Left Atrium: Left atrial size was normal in size. Right Atrium: Right atrial size was normal in size. Pericardium: A small pericardial effusion is present. Mitral Valve: The mitral valve is normal in structure. There is mild calcification of the mitral valve leaflet(s). Mild mitral annular calcification.  Trivial mitral valve regurgitation. No evidence of mitral valve stenosis. Tricuspid Valve: The tricuspid valve is normal in structure. Tricuspid valve regurgitation is not demonstrated. Aortic Valve: The aortic valve is tricuspid. Aortic valve regurgitation is not visualized. Aortic valve sclerosis/calcification is present, without any evidence of aortic stenosis. Aortic valve mean gradient measures 5.0 mmHg. Aortic valve peak gradient measures 9.0 mmHg. Aortic valve area, by VTI measures 1.94 cm. Pulmonic Valve: The pulmonic valve was normal in structure. Pulmonic valve regurgitation is not visualized. Aorta: The aortic root is normal in size and structure and aortic dilatation noted. There is mild dilatation of the ascending aorta, measuring 38 mm. Venous: The inferior vena cava is normal in size with greater than 50% respiratory variability, suggesting right atrial pressure of 3 mmHg. IAS/Shunts: No atrial level shunt detected by color flow Doppler.  LEFT VENTRICLE PLAX 2D LVIDd:         5.00 cm   Diastology LVIDs:         3.80 cm   LV e' medial:    5.77 cm/s LV PW:         1.20 cm   LV  E/e' medial:  8.4 LV IVS:        1.20 cm   LV e' lateral:   12.10 cm/s LVOT diam:     2.00 cm   LV E/e' lateral: 4.0 LV SV:         42 LV SV Index:   22 LVOT Area:     3.14 cm  RIGHT VENTRICLE RV Basal diam:  3.30 cm RV Mid diam:    2.50 cm RV S prime:     13.60 cm/s TAPSE (M-mode): 1.7 cm LEFT ATRIUM             Index        RIGHT ATRIUM           Index LA diam:        3.30 cm 1.72 cm/m   RA Area:     13.50 cm LA Vol (A2C):   42.2 ml 22.05 ml/m  RA Volume:   31.50 ml  16.46 ml/m LA Vol (A4C):   49.4 ml 25.81 ml/m LA Biplane Vol: 46.0 ml 24.03 ml/m  AORTIC VALVE AV Area (Vmax):    2.08 cm AV Area (Vmean):   1.97 cm AV Area (VTI):     1.94 cm AV Vmax:           150.00 cm/s AV Vmean:          99.300 cm/s AV VTI:            0.219 m AV Peak Grad:      9.0 mmHg AV Mean Grad:      5.0 mmHg LVOT Vmax:         99.25 cm/s LVOT Vmean:        62.150 cm/s LVOT VTI:          0.135 m LVOT/AV VTI ratio: 0.62  AORTA Ao Root diam: 3.40 cm Ao Asc diam:  3.80 cm MITRAL VALVE MV Area (PHT): 4.57 cm    SHUNTS MV Decel Time: 166 msec    Systemic VTI:  0.14 m MV E velocity: 48.40 cm/s  Systemic Diam: 2.00 cm MV A velocity: 67.70 cm/s MV E/A ratio:  0.71 Dalton McleanMD Electronically signed by Franki Monte Signature Date/Time: 01/16/2022/5:45:45 PM    Final    DG HIP UNILAT WITH PELVIS 2-3 VIEWS LEFT  Result Date:  01/16/2022 CLINICAL DATA:  Left hip pain.  No injury. EXAM: DG HIP (WITH OR WITHOUT PELVIS) 2-3V LEFT COMPARISON:  Hip x-ray 01/14/2022. FINDINGS: There is no evidence of hip fracture or dislocation. There is no evidence of arthropathy or other focal bone abnormality. IMPRESSION: Negative. Electronically Signed   By: Ronney Asters M.D.   On: 01/16/2022 19:24   DG HIPS BILAT WITH PELVIS MIN 5 VIEWS  Result Date: 01/14/2022 CLINICAL DATA:  Bilateral hip pain EXAM: DG HIP (WITH OR WITHOUT PELVIS) 5+V BILAT COMPARISON:  None. FINDINGS: There is no evidence of hip fracture or dislocation. There is no evidence of  arthropathy or other focal bone abnormality. IMPRESSION: Negative. Electronically Signed   By: Fidela Salisbury M.D.   On: 01/14/2022 21:57   IR ANGIO INTRA EXTRACRAN SEL COM CAROTID INNOMINATE BILAT MOD SED  Result Date: 01/17/2022 CLINICAL DATA:  History of transient ischemic attack comprising of right hand weakness with slurred speech evaluation of the brain revealed numerous diffusion-weighted abnormalities in the left middle cerebral artery distribution. CT of the head and neck revealed left internal carotid artery proximal occlusion and questionable string sign. EXAM: BILATERAL COMMON CAROTID AND INNOMINATE ANGIOGRAPHY COMPARISON:  CT angiogram of the head and neck of January 14, 2022. MEDICATIONS: Heparin 2000 units IV. No antibiotic was administered within 1 hour of the procedure. ANESTHESIA/SEDATION: Versed 1 mg IV; Fentanyl 25 mcg IV Moderate Sedation Time:  35 minutes The patient was continuously monitored during the procedure by the interventional radiology nurse under my direct supervision. CONTRAST:  Omnipaque 300 approximately 65 mL. FLUOROSCOPY TIME:  Fluoroscopy Time: 13 minutes 6 seconds (1170 mGy). COMPLICATIONS: None immediate. TECHNIQUE: Informed written consent was obtained from the patient after a thorough discussion of the procedural risks, benefits and alternatives. All questions were addressed. Maximal Sterile Barrier Technique was utilized including caps, mask, sterile gowns, sterile gloves, sterile drape, hand hygiene and skin antiseptic. A timeout was performed prior to the initiation of the procedure. The right forearm to the wrist was prepped and draped in usual sterile manner. The right radial artery was then identified with ultrasound, and its morphology documented permanently in the radiology PACS system. A dorsal palmar anastomosis was verified to be present. Using ultrasound guidance and micropuncture set access into the right radial artery was obtained with a 4/5 French radial  sheath over a 0.018 inch micro guidewire. The guidewire and obturator were removed. Good aspiration was obtained from the side port of the radial sheath. A cocktail of 2000 units of heparin, 2.5 mg of verapamil, and 200 mcg of nitroglycerin was then infused in diluted form through sheath without event. A right radial arteriogram was performed. Over a 0.035 inch Roadrunner guidewire, a 5 [place] Simmons 2 diagnostic catheter was then advanced to the aortic arch region, selectively positioned in the origin of the right vertebral artery, the right common carotid artery, the left common carotid artery and the left vertebral artery. A wrist band was then applied for hemostasis at the right radial puncture site. Distal right radial pulse was verified to be present. FINDINGS: The right vertebral artery origin demonstrates a severe stenosis. More distally, the vessel is seen to opacify to the cranial skull base. There is approximately 50% stenosis of the right vertebrobasilar junction just proximal to the origin of the right posterior-inferior cerebellar artery. Distal to this contrast is seen in the basilar artery and the posterior cerebral arteries. Mixed with unopacified blood is seen in the basilar artery from the contralateral vertebral  artery. The right common carotid arteriogram demonstrates the right external carotid artery and its major branches to be widely patent. The right internal carotid artery has an eccentric smooth atherosclerotic plaque along the lateral aspect just distal to the bulb with approximately 20% stenosis by the NASCET criteria. More distally, the vessel is seen to opacify to the cranial skull base. Mild caliber irregularity probably related to arteriosclerosis is seen of the cavernous segment of the right internal carotid artery. The right middle cerebral artery and the right anterior cerebral artery opacify into the capillary and venous phases. Prompt opacification of the left anterior cerebral  A2 segment and distally is seen via anterior communicating artery. A small infundibulum is evident in the right posterior communicating artery. The left common carotid arteriogram demonstrates the left external carotid artery and its major branches to be patent. Left internal carotid artery at the bulb and its proximal 1/3 demonstrates slow ascent of contrast to near complete occlusion. The delayed images demonstrated ascent of contrast to the distal cervical left ICA. More distally, reconstitution of the left internal carotid artery and the supraclinoid segment is noted from left external carotid artery branches via the ipsilateral ophthalmic artery. Opacification of the right left middle cerebral artery distribution is evident. No opacification of the left anterior cerebral artery is evident. The left vertebral artery origin is widely patent. The vessel is seen to opacify to the cranial skull base. Patency is seen of the left vertebrobasilar junction with a 50%-60% stenosis of the left vertebrobasilar junction just proximal to the origin of the left posterior-inferior cerebellar artery. Mild caliber irregularity of the proximal PICA suggests intracranial arteriosclerosis. More distally, opacification is seen of the basilar artery, the posterior cerebral arteries, the superior cerebellar arteries and the anterior-inferior cerebellar arteries into the capillary and venous phases. Unopacified blood is seen in the basilar artery from the contralateral vertebral artery. 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 Partial reconstitution of the left internal carotid artery supraclinoid segment from the ipsilateral ophthalmic artery. Non opacification of the left anterior cerebral artery A1 segment. 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 Approximately 50% to 60% stenosis of  the dominant left vertebrobasilar junction just proximal to the left posterior-inferior cerebellar artery. Approximately 50% stenosis of the right vertebrobasilar junction proximal to the right posteroinferior cerebellar artery. PLAN: Finding discussed with the patient and the referring neurologist. Electronically Signed   By: Luanne Bras M.D.   On: 01/17/2022 08:16   IR ANGIO VERTEBRAL SEL SUBCLAVIAN INNOMINATE BILAT MOD SED  Result Date: 01/17/2022 CLINICAL DATA:  History of transient ischemic attack comprising of right hand weakness with slurred speech evaluation of the brain revealed numerous diffusion-weighted abnormalities in the left middle cerebral artery distribution. CT of the head and neck revealed left internal carotid artery proximal occlusion and questionable string sign. EXAM: BILATERAL COMMON CAROTID AND INNOMINATE ANGIOGRAPHY COMPARISON:  CT angiogram of the head and neck of January 14, 2022. MEDICATIONS: Heparin 2000 units IV. No antibiotic was administered within 1 hour of the procedure. ANESTHESIA/SEDATION: Versed 1 mg IV; Fentanyl 25 mcg IV Moderate Sedation Time:  35 minutes The patient was continuously monitored during the procedure by the interventional radiology nurse under my direct supervision. CONTRAST:  Omnipaque 300 approximately 65 mL. FLUOROSCOPY TIME:  Fluoroscopy Time: 13 minutes 6 seconds (1170 mGy). COMPLICATIONS: None immediate. TECHNIQUE: Informed written consent was  obtained from the patient after a thorough discussion of the procedural risks, benefits and alternatives. All questions were addressed. Maximal Sterile Barrier Technique was utilized including caps, mask, sterile gowns, sterile gloves, sterile drape, hand hygiene and skin antiseptic. A timeout was performed prior to the initiation of the procedure. The right forearm to the wrist was prepped and draped in usual sterile manner. The right radial artery was then identified with ultrasound, and its morphology  documented permanently in the radiology PACS system. A dorsal palmar anastomosis was verified to be present. Using ultrasound guidance and micropuncture set access into the right radial artery was obtained with a 4/5 French radial sheath over a 0.018 inch micro guidewire. The guidewire and obturator were removed. Good aspiration was obtained from the side port of the radial sheath. A cocktail of 2000 units of heparin, 2.5 mg of verapamil, and 200 mcg of nitroglycerin was then infused in diluted form through sheath without event. A right radial arteriogram was performed. Over a 0.035 inch Roadrunner guidewire, a 5 [place] Simmons 2 diagnostic catheter was then advanced to the aortic arch region, selectively positioned in the origin of the right vertebral artery, the right common carotid artery, the left common carotid artery and the left vertebral artery. A wrist band was then applied for hemostasis at the right radial puncture site. Distal right radial pulse was verified to be present. FINDINGS: The right vertebral artery origin demonstrates a severe stenosis. More distally, the vessel is seen to opacify to the cranial skull base. There is approximately 50% stenosis of the right vertebrobasilar junction just proximal to the origin of the right posterior-inferior cerebellar artery. Distal to this contrast is seen in the basilar artery and the posterior cerebral arteries. Mixed with unopacified blood is seen in the basilar artery from the contralateral vertebral artery. The right common carotid arteriogram demonstrates the right external carotid artery and its major branches to be widely patent. The right internal carotid artery has an eccentric smooth atherosclerotic plaque along the lateral aspect just distal to the bulb with approximately 20% stenosis by the NASCET criteria. More distally, the vessel is seen to opacify to the cranial skull base. Mild caliber irregularity probably related to arteriosclerosis is seen  of the cavernous segment of the right internal carotid artery. The right middle cerebral artery and the right anterior cerebral artery opacify into the capillary and venous phases. Prompt opacification of the left anterior cerebral A2 segment and distally is seen via anterior communicating artery. A small infundibulum is evident in the right posterior communicating artery. The left common carotid arteriogram demonstrates the left external carotid artery and its major branches to be patent. Left internal carotid artery at the bulb and its proximal 1/3 demonstrates slow ascent of contrast to near complete occlusion. The delayed images demonstrated ascent of contrast to the distal cervical left ICA. More distally, reconstitution of the left internal carotid artery and the supraclinoid segment is noted from left external carotid artery branches via the ipsilateral ophthalmic artery. Opacification of the right left middle cerebral artery distribution is evident. No opacification of the left anterior cerebral artery is evident. The left vertebral artery origin is widely patent. The vessel is seen to opacify to the cranial skull base. Patency is seen of the left vertebrobasilar junction with a 50%-60% stenosis of the left vertebrobasilar junction just proximal to the origin of the left posterior-inferior cerebellar artery. Mild caliber irregularity of the proximal PICA suggests intracranial arteriosclerosis. More distally, opacification is seen of the  basilar artery, the posterior cerebral arteries, the superior cerebellar arteries and the anterior-inferior cerebellar arteries into the capillary and venous phases. Unopacified blood is seen in the basilar artery from the contralateral vertebral artery. 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 Partial reconstitution of the left internal carotid  artery supraclinoid segment from the ipsilateral ophthalmic artery. Non opacification of the left anterior cerebral artery A1 segment. 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 Approximately 50% to 60% stenosis of the dominant left vertebrobasilar junction just proximal to the left posterior-inferior cerebellar artery. Approximately 50% stenosis of the right vertebrobasilar junction proximal to the right posteroinferior cerebellar artery. PLAN: Finding discussed with the patient and the referring neurologist. Electronically Signed   By: Luanne Bras M.D.   On: 01/17/2022 08:16       The results of significant diagnostics from this hospitalization (including imaging, microbiology, ancillary and laboratory) are listed below for reference.     Microbiology: Recent Results (from the past 240 hour(s))  Resp Panel by RT-PCR (Flu A&B, Covid) Nasopharyngeal Swab     Status: None   Collection Time: 01/14/22  1:43 PM   Specimen: Nasopharyngeal Swab; Nasopharyngeal(NP) swabs in vial transport medium  Result Value Ref Range Status   SARS Coronavirus 2 by RT PCR NEGATIVE NEGATIVE Final    Comment: (NOTE) SARS-CoV-2 target nucleic acids are NOT DETECTED.  The SARS-CoV-2 RNA is generally detectable in upper respiratory specimens during the acute phase of infection. The lowest concentration of SARS-CoV-2 viral copies this assay can detect is 138 copies/mL. A negative result does not preclude SARS-Cov-2 infection and should not be used as the sole basis for treatment or other patient management decisions. A negative result may occur with  improper specimen collection/handling, submission of specimen other than nasopharyngeal swab, presence of viral mutation(s) within the areas targeted by this assay, and inadequate number of viral copies(<138 copies/mL). A negative result must be combined with clinical observations, patient history, and epidemiological information. The expected  result is Negative.  Fact Sheet for Patients:  EntrepreneurPulse.com.au  Fact Sheet for Healthcare Providers:  IncredibleEmployment.be  This test is no t yet approved or cleared by the [place] FDA and  has been authorized for detection and/or diagnosis of SARS-CoV-2 by FDA under an Emergency Use Authorization (EUA). This EUA will remain  in effect (meaning this test can be used) for the duration of the COVID-19 declaration under Section 564(b)(1) of the Act, 21 U.S.C.section 360bbb-3(b)(1), unless the authorization is terminated  or revoked sooner.       Influenza A by PCR NEGATIVE NEGATIVE Final   Influenza B by PCR NEGATIVE NEGATIVE Final    Comment: (NOTE) The Xpert Xpress SARS-CoV-2/FLU/RSV plus assay is intended as an aid in the diagnosis of influenza from Nasopharyngeal swab specimens and should not be used as a sole basis for treatment. Nasal washings and aspirates are unacceptable for Xpert Xpress SARS-CoV-2/FLU/RSV testing.  Fact Sheet for Patients: EntrepreneurPulse.com.au  Fact Sheet for Healthcare Providers: IncredibleEmployment.be  This test is not yet approved or cleared by the [place] FDA and has been authorized for detection and/or diagnosis of SARS-CoV-2 by FDA under an Emergency Use Authorization (EUA). This EUA will remain in effect (meaning this test can be used) for the duration of the COVID-19 declaration under Section 564(b)(1) of the Act, 21 U.S.C. section 360bbb-3(b)(1), unless the authorization is  terminated or revoked.  Performed at [facility], [place] [address], [place], [place] [address]   Surgical PCR screen     Status: Abnormal   Collection Time: 01/18/22  9:24 AM   Specimen: Nasal Mucosa; Nasal Swab  Result Value Ref Range Status   MRSA, PCR NEGATIVE NEGATIVE Final   Staphylococcus aureus POSITIVE (A) NEGATIVE Final    Comment:  (NOTE) The Xpert SA Assay (FDA approved for NASAL specimens in patients 42 years of age and older), is one component of a comprehensive surveillance program. It is not intended to diagnose infection nor to guide or monitor treatment. Performed at [facility], [place] [address], [place], [place] [address]      Labs:  CBC: Recent Labs  Lab 01/14/22 1342 01/14/22 1349 01/16/22 0355 01/18/22 0329 01/19/22 0317 01/19/22 0842 01/19/22 1927 01/20/22 0315  WBC 6.6  --  6.7 7.1 6.6  --   --  6.6  NEUTROABS 4.6  --   --   --  5.5  --   --   --   HGB 9.4*   < > 8.5* 8.2* 6.7* 6.6* 8.3* 7.8*  HCT 28.7*   < > 25.7* 24.8* 20.3* 19.7* 24.3* 23.4*  MCV 91.4  --  91.5 90.5 91.9  --   --  91.1  PLT 205  --  219 223 221  --   --  178   < > = values in this interval not displayed.   BMP &GFR Recent Labs  Lab 01/15/22 1514 01/16/22 0355 01/18/22 0329 01/19/22 0317 01/19/22 0333 01/20/22 0315  NA  --  133* 135 135 135 136  K  --  5.2* 4.7 5.3* 5.3* 4.3  CL  --  104 106 108 108 106  CO2  --  19* 21* 19* 18* 22  GLUCOSE  --  88 95 108* 109* 92  BUN  --  38* 27* 34* 35* 25*  CREATININE  --  4.73* 4.72* 5.13* 5.12* 3.63*  CALCIUM  --  8.5* 8.6* 8.2* 8.1* 7.8*  MG 1.9  --   --   --   --  1.8  PHOS 5.7*  --  5.0*  --  6.0* 4.9*   Estimated Creatinine Clearance: 19.3 mL/min (A) (by C-G formula based on SCr of 3.63 mg/dL (H)). Liver & Pancreas: Recent Labs  Lab 01/14/22 1342 01/15/22 1514 01/18/22 0329 01/19/22 0333 01/20/22 0315  AST 14* 11*  --   --   --   ALT 12 11  --   --   --   ALKPHOS 86 72  --   --   --   BILITOT 0.6 0.3  --   --   --   PROT 5.9* 5.0*  --   --   --   ALBUMIN 3.2* 2.7* 2.9* 2.7* 2.6*   No results for input(s): LIPASE, AMYLASE in the last 168 hours. No results for input(s): AMMONIA in the last 168 hours. Diabetic: No results for input(s): HGBA1C in the last 72 hours. No results for input(s): GLUCAP in the last 168 hours. Cardiac Enzymes: No  results for input(s): CKTOTAL, CKMB, CKMBINDEX, TROPONINI in the last 168 hours. No results for input(s): PROBNP in the last 8760 hours. Coagulation Profile: Recent Labs  Lab 01/14/22 1342 01/16/22 0355  INR 1.0 1.0   Thyroid Function Tests: No results for input(s): TSH, T4TOTAL, FREET4, T3FREE, THYROIDAB in the last 72 hours. Lipid Profile: No results for input(s):  CHOL, HDL, LDLCALC, TRIG, CHOLHDL, LDLDIRECT in the last 72 hours. Anemia Panel: No results for input(s): VITAMINB12, FOLATE, FERRITIN, TIBC, IRON, RETICCTPCT in the last 72 hours. Urine analysis:    Component Value Date/Time   COLORURINE STRAW (A) 01/14/2022 1823   APPEARANCEUR CLEAR 01/14/2022 1823   LABSPEC 1.010 01/14/2022 1823   PHURINE 8.0 01/14/2022 1823   GLUCOSEU 50 (A) 01/14/2022 1823   HGBUR NEGATIVE 01/14/2022 1823   BILIRUBINUR NEGATIVE 01/14/2022 1823   KETONESUR NEGATIVE 01/14/2022 1823   PROTEINUR >=300 (A) 01/14/2022 1823   UROBILINOGEN 0.2 06/26/2007 1023   NITRITE NEGATIVE 01/14/2022 1823   LEUKOCYTESUR NEGATIVE 01/14/2022 1823   Sepsis Labs: Invalid input(s): PROCALCITONIN, LACTICIDVEN   Time coordinating discharge: 45 minutes  SIGNED:  Mercy Riding, MD  [facility] 01/20/2022, 1:21 PM

## 2022-01-20 NOTE — TOC CM/SW Note (Signed)
Provided Brilinta discount card prior to discharge.   Marthenia Rolling, MSN, RN,BSN Inpatient [facility] Case Manager [phone]

## 2022-01-21 ENCOUNTER — Other Ambulatory Visit (HOSPITAL_COMMUNITY): Payer: Self-pay

## 2022-01-21 ENCOUNTER — Encounter (HOSPITAL_COMMUNITY): Payer: Self-pay | Admitting: Interventional Radiology

## 2022-01-21 ENCOUNTER — Telehealth (HOSPITAL_COMMUNITY): Payer: Self-pay | Admitting: *Deleted

## 2022-01-21 ENCOUNTER — Telehealth: Payer: Self-pay | Admitting: Physician Assistant

## 2022-01-21 LAB — TYPE AND SCREEN
ABO/RH(D): A NEG
Antibody Screen: POSITIVE
Unit division: 0
Unit division: 0

## 2022-01-21 LAB — BPAM RBC
Blood Product Expiration Date: 202302022359
Blood Product Expiration Date: 202302022359
ISSUE DATE / TIME: 202301281042
Unit Type and Rh: 600
Unit Type and Rh: 600

## 2022-01-21 NOTE — Telephone Encounter (Signed)
Transition of care contact from inpatient facility  Date of discharge: 01/19/22 Date of contact: 01/21/22 Method: Phone Spoke to: Patient's daughter in law, Raynelle Highland  Patient contacted to discuss transition of care from recent inpatient hospitalization. Patient was admitted to [facility] from 01/14/27-01/19/22 with discharge diagnosis of GI bleed.  Medication changes were reviewed. DIL reports they have all of his medications  Patient will follow up with his/her outpatient HD unit on: 01/21/22 (had HD this morning)  Anice Paganini, PA-C 01/21/2022, 2:52 PM  [facility]

## 2022-01-21 NOTE — Progress Notes (Signed)
PCP: Pcp, No Oncology: Dr. Burr Medico Cardiology: Dr. Aundra Dubin  71 y.o. with minimal past history was admitted in 9/22 with AKI, group B strep bacteremia, and acute CHF.  He had to start on dialysis.  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  Echo showed EF 45-50%, moderate LVH, small pericardial effusion. Cardiac MRI showed EF 38%, moderate LVH, small pericardial effusion, elevated T1 suggestive of cardiac amyloidosis. He was started on chemotherapy with Velcade and Cytoxan, which he continues currently.   Seen in clinic 11/22, compression hose and midodrine started for orthostasis.  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 Evaluated by IR and underwent revascularization on 01/18/22 but re-occluded. IR recommended CTA head and neck in 8 weeks. Echo with EF 55-60%, moderate LVH, grade I DD, normal RV. Discharged with outpatient neuro rehab.  Today he returns for post hospital HF follow up with his daughter in law. Overall feeling fine. He does not have any deficits from his CVA. He is smoking 1/2 ppd. He makes urine and is hoping to one day come off HD. He has vein mapping scheduled soon. He does not have significant dyspnea walking on flat ground. No issues at dialysis or low BP requiring him to end treatments early. Denies CP, dizziness, edema, or PND/Orthopnea. Appetite ok. No fever or chills. Weight stable. Taking all medications.   ECG (personally reviewed): sinus tachy 119.  Labs (10/22): hgb 8.9 Labs (1/23): hgb 7.8  PMH: 1. Nephrolithiasis 2. 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  - Treating with Cytoxan and Velcade.  3. Anemia 4. 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  - Cardiac MRI (9/22): EF 38%, moderate LVH, small pericardial effusion, elevated T1 suggestive of cardiac amyloidosis.   - Echo 1/23 EF  55-60%, mild LVH, Grade I DD, normal RV.  6. Orthostatic hypotension.  7. 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   Social History   Socioeconomic History   Marital status: Married    Spouse name: Not on file   Number of children: Not on file   Years of education: Not on file   Highest education level: Not on file  Occupational History   Not on file  Tobacco Use   Smoking status: Some Days    Packs/day: 0.25    Years: 25.00    Pack years: 6.25    Types: Cigarettes   Smokeless tobacco: Never  Substance and Sexual Activity   Alcohol use: Not on file   Drug use: Not on file   Sexual activity: Not on file  Other Topics Concern   Not on file  Social History Narrative   Not on file   Social Determinants of Health   Financial Resource Strain: Low Risk    Difficulty of Paying Living Expenses: Not very hard  Food Insecurity: No Food Insecurity   Worried About Running Out of Food in the Last Year: Never true   [place] in the Last Year: Never true  Transportation Needs: No Transportation Needs   Lack of Transportation (Medical): No   Lack of Transportation (Non-Medical): No  Physical Activity: Not on file  Stress: Not on file  Social Connections: Not on file  Intimate Partner Violence: Not on file   No family history on file.  ROS: All systems reviewed and negative except as per  HPI.   Current Outpatient Medications  Medication Sig Dispense Refill   acetaminophen (TYLENOL) 325 MG tablet Take 2 tablets (650 mg total) by mouth every 6 (six) hours as needed for moderate pain (headache).     ALPRAZolam (XANAX) 0.5 MG tablet TAKE 1 TABLET BY MOUTH 2 TIMES DAILY AS NEEDED FOR SLEEP OR ANXIETY (Patient taking differently: Take 0.5 mg by mouth See admin instructions. Take 0.5 mg by mouth at bedtime and an additional 0.5 mg once a day as needed for anxiety) 60 tablet 0   aspirin 81 MG EC tablet Take 1 tablet (81 mg total) by mouth daily. Swallow  whole. 30 tablet 11   atorvastatin (LIPITOR) 40 MG tablet Take 1 tablet (40 mg total) by mouth at bedtime. 90 tablet 1   calcitRIOL (ROCALTROL) 0.25 MCG capsule Take 1 capsule (0.25 mcg total) by mouth every Monday, Wednesday, and Friday with hemodialysis. 30 capsule 1   cyclophosphamide (CYTOXAN) 50 MG capsule Take 9 capsules (450 mg total) by mouth once a week. Take with food to minimize GI upset. Take early in the day and maintain hydration. (Patient taking differently: Take 450 mg by mouth See admin instructions. Take 450 mg by mouth every Thursday with food- to minimize GI upset. Take early in the day and maintain hydration.) 36 capsule 1   folic acid (FOLVITE) 1 MG tablet TAKE 2 TABLETS BY MOUTH EVERY DAY (Patient taking differently: Take 2 mg by mouth in the morning.) 60 tablet 0   Melatonin 10 MG TABS Take 10 mg by mouth at bedtime.     midodrine (PROAMATINE) 5 MG tablet Take 1 tablet (5 mg total) by mouth 3 (three) times daily with meals. 270 tablet 3   mirtazapine (REMERON) 30 MG tablet Take 1 tablet (30 mg total) by mouth at bedtime. 30 tablet 1   ondansetron (ZOFRAN) 4 MG tablet Take 1 tablet (4 mg total) by mouth every 6 (six) hours as needed for nausea or vomiting. 30 tablet 2   oxyCODONE (OXY IR/ROXICODONE) 5 MG immediate release tablet Take 1 tablet (5 mg total) by mouth every 6 (six) hours as needed for severe pain. 60 tablet 0   pantoprazole (PROTONIX) 40 MG tablet TAKE 1 TABLET BY MOUTH 2 TIMES DAILY BEFORE A meal (Patient taking differently: Take 40 mg by mouth 2 (two) times daily before a meal.) 60 tablet 0   prochlorperazine (COMPAZINE) 5 MG tablet Take 1 tablet (5 mg total) by mouth every 6 (six) hours as needed for nausea or vomiting. 30 tablet 2   sevelamer carbonate (RENVELA) 800 MG tablet Take 800 mg by mouth 3 (three) times daily with meals.     tamsulosin (FLOMAX) 0.4 MG CAPS capsule TAKE 1 CAPSULE BY MOUTH EVERY DAY (Patient taking differently: Take 0.4 mg by mouth in the  morning.) 30 capsule 0   ticagrelor (BRILINTA) 90 MG TABS tablet Take 1 tablet (90 mg total) by mouth 2 (two) times daily. 60 tablet 0   zolpidem (AMBIEN) 5 MG tablet Take 1 tablet (5 mg total) by mouth at bedtime as needed for sleep. (Patient taking differently: Take 5 mg by mouth at bedtime.) 30 tablet 0   No current facility-administered medications for this encounter.   Facility-Administered Medications Ordered in Other Encounters  Medication Dose Route Frequency Provider Last Rate Last Admin   heparin lock flush 100 unit/mL  500 Units Intracatheter Once Truitt Merle, MD       sodium chloride flush (NS) 0.9 % injection  10 mL  10 mL Intracatheter Once Truitt Merle, MD       [facility] Readings from Last 3 Encounters:  01/22/22 75.3 kg (166 lb)  01/16/22 74 kg (163 lb 2.3 oz)  01/10/22 77.2 kg (170 lb 3.2 oz)   BP 118/74    Pulse (!) 118    Wt 75.3 kg    SpO2 98%    BMI 23.82 kg/m  General:  NAD. No resp difficulty, walked into clinic, thin. HEENT: Normal Neck: Supple. No JVD. Carotids 2+ bilat; no bruits. No lymphadenopathy or thryomegaly appreciated. Cor: PMI nondisplaced. Tachy rate & rhythm. No rubs, gallops or murmurs. Lungs: Clear, R chest TDC looks ok Abdomen: Soft, nontender, nondistended. No hepatosplenomegaly. No bruits or masses. Good bowel sounds. Extremities: No cyanosis, clubbing, rash, edema Neuro: Alert & oriented x 3, cranial nerves grossly intact. Moves all 4 extremities w/o difficulty. Mildly anxious.  Assessment/Plan: 1. 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  He has biopsy-proven AL amyloidosis from renal and bone marrow biopsies.  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  EF was low (38%) on cardiac MRI, but GDMT is limited by ESRD and orthostatic hypotension.  Echo 1/23 EF 55-60%, mild LVH, normal RV. Would avoid beta blocker in the setting of cardiac  amyloidosis.  He is not volume overloaded on exam.  - Treatment will be chemotherapy for AL amyloidosis => Velcade + Cytoxan.  - Repeat echo in 6 months or so after treatment.  2. 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  - Continue midodrine 5 mg tid.  - Continue graded compression stockings during the day.  3. ESRD: Suspect due to amyloidosis.  There is some hope that he may be able to come off HD. MWF schedule. 4. CVA: New left MCA, left ICA occlusion/high grade stenosis. Neuro IR attempted re-vascularization, will need CTA in 8 weeks. - Continue ASA + Brilinta x 30 days (2/29/23), followed by ASA alone. - Continue statin. - BP goal 130-150 given left ICA occlusion. 5. Tobacco Use: Discussed cessation. 6. ST: avoiding beta blocker with cardiac amyloidosis. He has not had his xanax today and is trying to cut back on cigarettes. He is admittedly anxious at visit today. - With recent CVA and cardiac amyloid, Place Zio 14 day to assess for any arrhythmias.  Follow up with Dr. Aundra Dubin in 3 months.  [place] FNP 01/22/2022

## 2022-01-21 NOTE — Telephone Encounter (Signed)
[name] was contacted by telephone to verify understanding of discharge instructions status post their most recent discharge from the hospital on the date:  01/20/22.  Inpatient discharge AVS was re-reviewed with patient, along with cancer center appointments.  Verification of understanding for oncology specific follow-up was validated using the Teach Back method.    Transportation to appointments were confirmed for the patient as being self/caregiver.  [name] questions were addressed to their satisfaction upon completion of this post discharge follow-up call for outpatient oncology.

## 2022-01-22 ENCOUNTER — Ambulatory Visit (HOSPITAL_COMMUNITY)
Admission: RE | Admit: 2022-01-22 | Discharge: 2022-01-22 | Disposition: A | Discharge status: Home / Self Care | Payer: Commercial Managed Care - PPO | Source: Ambulatory Visit | Attending: Cardiology | Admitting: Cardiology

## 2022-01-22 ENCOUNTER — Ambulatory Visit (HOSPITAL_COMMUNITY)
Admission: RE | Admit: 2022-01-22 | Discharge: 2022-01-22 | Disposition: A | Discharge status: Home / Self Care | Payer: Commercial Managed Care - PPO | Source: Ambulatory Visit | Attending: Family Medicine | Admitting: Family Medicine

## 2022-01-22 ENCOUNTER — Other Ambulatory Visit: Payer: Self-pay

## 2022-01-22 VITALS — BP 118/74 | HR 118 | Wt 166.0 lb

## 2022-01-22 DIAGNOSIS — E854 Organ-limited amyloidosis: Secondary | ICD-10-CM | POA: Diagnosis not present

## 2022-01-22 DIAGNOSIS — Z09 Encounter for follow-up examination after completed treatment for conditions other than malignant neoplasm: Secondary | ICD-10-CM | POA: Diagnosis not present

## 2022-01-22 DIAGNOSIS — Z992 Dependence on renal dialysis: Secondary | ICD-10-CM | POA: Diagnosis not present

## 2022-01-22 DIAGNOSIS — Z8673 Personal history of transient ischemic attack (TIA), and cerebral infarction without residual deficits: Secondary | ICD-10-CM | POA: Diagnosis not present

## 2022-01-22 DIAGNOSIS — R002 Palpitations: Secondary | ICD-10-CM | POA: Diagnosis not present

## 2022-01-22 DIAGNOSIS — I951 Orthostatic hypotension: Secondary | ICD-10-CM | POA: Insufficient documentation

## 2022-01-22 DIAGNOSIS — I639 Cerebral infarction, unspecified: Secondary | ICD-10-CM

## 2022-01-22 DIAGNOSIS — I43 Cardiomyopathy in diseases classified elsewhere: Secondary | ICD-10-CM

## 2022-01-22 DIAGNOSIS — F17210 Nicotine dependence, cigarettes, uncomplicated: Secondary | ICD-10-CM | POA: Insufficient documentation

## 2022-01-22 DIAGNOSIS — I517 Cardiomegaly: Secondary | ICD-10-CM | POA: Insufficient documentation

## 2022-01-22 DIAGNOSIS — E8581 Light chain (AL) amyloidosis: Secondary | ICD-10-CM | POA: Insufficient documentation

## 2022-01-22 DIAGNOSIS — Z79899 Other long term (current) drug therapy: Secondary | ICD-10-CM | POA: Diagnosis not present

## 2022-01-22 DIAGNOSIS — N186 End stage renal disease: Secondary | ICD-10-CM | POA: Insufficient documentation

## 2022-01-22 DIAGNOSIS — R000 Tachycardia, unspecified: Secondary | ICD-10-CM

## 2022-01-22 DIAGNOSIS — Z720 Tobacco use: Secondary | ICD-10-CM

## 2022-01-22 NOTE — Patient Instructions (Signed)
It was great to see you today! No medication changes are needed at this time.   Your physician recommends that you schedule a follow-up appointment in: 3 months with Dr McLean  Do the following things EVERYDAY: Weigh yourself in the morning before breakfast. Write it down and keep it in a log. Take your medicines as prescribed Eat low salt foods--Limit salt (sodium) to 2000 mg per day.  Stay as active as you can everyday Limit all fluids for the day to less than 2 liters  At the [facility], you and your health needs are our priority. As part of our continuing mission to provide you with exceptional heart care, we have created designated Provider Care Teams. These Care Teams include your primary Cardiologist (physician) and Advanced Practice Providers (APPs- Physician Assistants and Nurse Practitioners) who all work together to provide you with the care you need, when you need it.   You may see any of the following providers on your designated Care Team at your next follow up: Dr Daniel Bensimhon Dr Dalton McLean Amy Clegg, NP Brittainy Simmons, PA Jessica Milford,NP Lindsay Finch, PA Lauren Kemp, PharmD   Please be sure to bring in all your medications bottles to every appointment.    If you have any questions or concerns before your next appointment please send us a message through mychart or call our office at [phone].    TO LEAVE A MESSAGE FOR THE NURSE SELECT OPTION 2, PLEASE LEAVE A MESSAGE INCLUDING: YOUR NAME DATE OF BIRTH CALL BACK NUMBER REASON FOR CALL**this is important as we prioritize the call backs  YOU WILL RECEIVE A CALL BACK THE SAME DAY AS LONG AS YOU CALL BEFORE 4:00 PM   

## 2022-01-24 ENCOUNTER — Inpatient Hospital Stay: Payer: Commercial Managed Care - PPO

## 2022-01-28 ENCOUNTER — Other Ambulatory Visit: Payer: Self-pay | Admitting: Hematology

## 2022-01-28 MED ORDER — OXYCODONE HCL 5 MG PO TABS: 5.0000 mg | ORAL_TABLET | Freq: Four times a day (QID) | ORAL | 0 refills | Status: DC | PRN

## 2022-01-30 ENCOUNTER — Other Ambulatory Visit: Payer: Self-pay

## 2022-01-30 ENCOUNTER — Other Ambulatory Visit: Payer: Self-pay | Admitting: Hematology

## 2022-01-30 DIAGNOSIS — N186 End stage renal disease: Secondary | ICD-10-CM

## 2022-01-31 ENCOUNTER — Telehealth: Payer: Self-pay

## 2022-01-31 ENCOUNTER — Encounter: Payer: Self-pay | Admitting: Hematology

## 2022-01-31 ENCOUNTER — Inpatient Hospital Stay: Payer: Commercial Managed Care - PPO

## 2022-01-31 ENCOUNTER — Inpatient Hospital Stay (HOSPITAL_BASED_OUTPATIENT_CLINIC_OR_DEPARTMENT_OTHER): Payer: Commercial Managed Care - PPO | Admitting: Hematology

## 2022-01-31 ENCOUNTER — Ambulatory Visit: Payer: Commercial Managed Care - PPO

## 2022-01-31 ENCOUNTER — Inpatient Hospital Stay (HOSPITAL_BASED_OUTPATIENT_CLINIC_OR_DEPARTMENT_OTHER): Payer: Commercial Managed Care - PPO | Admitting: Nurse Practitioner

## 2022-01-31 ENCOUNTER — Inpatient Hospital Stay: Payer: Commercial Managed Care - PPO | Attending: Hematology

## 2022-01-31 ENCOUNTER — Encounter: Payer: Self-pay | Admitting: Nurse Practitioner

## 2022-01-31 ENCOUNTER — Other Ambulatory Visit: Payer: Commercial Managed Care - PPO

## 2022-01-31 ENCOUNTER — Other Ambulatory Visit: Payer: Self-pay

## 2022-01-31 VITALS — BP 108/66 | HR 106 | Temp 98.4°F | Resp 16 | Wt 164.5 lb

## 2022-01-31 VITALS — HR 93

## 2022-01-31 DIAGNOSIS — N186 End stage renal disease: Secondary | ICD-10-CM | POA: Insufficient documentation

## 2022-01-31 DIAGNOSIS — Z992 Dependence on renal dialysis: Secondary | ICD-10-CM | POA: Insufficient documentation

## 2022-01-31 DIAGNOSIS — E854 Organ-limited amyloidosis: Secondary | ICD-10-CM | POA: Diagnosis not present

## 2022-01-31 DIAGNOSIS — C9000 Multiple myeloma not having achieved remission: Secondary | ICD-10-CM | POA: Insufficient documentation

## 2022-01-31 DIAGNOSIS — K5900 Constipation, unspecified: Secondary | ICD-10-CM | POA: Diagnosis not present

## 2022-01-31 DIAGNOSIS — R110 Nausea: Secondary | ICD-10-CM | POA: Diagnosis not present

## 2022-01-31 DIAGNOSIS — E8581 Light chain (AL) amyloidosis: Secondary | ICD-10-CM

## 2022-01-31 DIAGNOSIS — G893 Neoplasm related pain (acute) (chronic): Secondary | ICD-10-CM

## 2022-01-31 DIAGNOSIS — Z5112 Encounter for antineoplastic immunotherapy: Secondary | ICD-10-CM | POA: Insufficient documentation

## 2022-01-31 DIAGNOSIS — Z515 Encounter for palliative care: Secondary | ICD-10-CM

## 2022-01-31 DIAGNOSIS — D638 Anemia in other chronic diseases classified elsewhere: Secondary | ICD-10-CM | POA: Insufficient documentation

## 2022-01-31 DIAGNOSIS — G4700 Insomnia, unspecified: Secondary | ICD-10-CM | POA: Diagnosis not present

## 2022-01-31 DIAGNOSIS — F419 Anxiety disorder, unspecified: Secondary | ICD-10-CM | POA: Insufficient documentation

## 2022-01-31 DIAGNOSIS — I43 Cardiomyopathy in diseases classified elsewhere: Secondary | ICD-10-CM

## 2022-01-31 DIAGNOSIS — I951 Orthostatic hypotension: Secondary | ICD-10-CM | POA: Diagnosis not present

## 2022-01-31 LAB — COMPREHENSIVE METABOLIC PANEL
ALT: 5 U/L (ref 0–44)
AST: 8 U/L — ABNORMAL LOW (ref 15–41)
Albumin: 3.3 g/dL — ABNORMAL LOW (ref 3.5–5.0)
Alkaline Phosphatase: 83 U/L (ref 38–126)
Anion gap: 7 (ref 5–15)
BUN: 13 mg/dL (ref 8–23)
CO2: 30 mmol/L (ref 22–32)
Calcium: 8.7 mg/dL — ABNORMAL LOW (ref 8.9–10.3)
Chloride: 102 mmol/L (ref 98–111)
Creatinine, Ser: 3.24 mg/dL (ref 0.61–1.24)
GFR, Estimated: 20 mL/min — ABNORMAL LOW (ref >60)
Glucose, Bld: 134 mg/dL — ABNORMAL HIGH (ref 70–99)
Potassium: 4.1 mmol/L (ref 3.5–5.1)
Sodium: 139 mmol/L (ref 135–145)
Total Bilirubin: 0.3 mg/dL (ref 0.3–1.2)
Total Protein: 5.9 g/dL — ABNORMAL LOW (ref 6.5–8.1)

## 2022-01-31 LAB — CBC WITH DIFFERENTIAL/PLATELET
Abs Immature Granulocytes: 0.02 10*3/uL (ref 0.00–0.07)
Basophils Absolute: 0.1 10*3/uL (ref 0.0–0.1)
Basophils Relative: 1 %
Eosinophils Absolute: 0.2 10*3/uL (ref 0.0–0.5)
Eosinophils Relative: 2 %
HCT: 26.3 % — ABNORMAL LOW (ref 39.0–52.0)
Hemoglobin: 8.4 g/dL — ABNORMAL LOW (ref 13.0–17.0)
Immature Granulocytes: 0 %
Lymphocytes Relative: 13 %
Lymphs Abs: 1.2 10*3/uL (ref 0.7–4.0)
MCH: 29.4 pg (ref 26.0–34.0)
MCHC: 31.9 g/dL (ref 30.0–36.0)
MCV: 92 fL (ref 80.0–100.0)
Monocytes Absolute: 0.7 10*3/uL (ref 0.1–1.0)
Monocytes Relative: 7 %
Neutro Abs: 6.8 10*3/uL (ref 1.7–7.7)
Neutrophils Relative %: 77 %
Platelets: 203 10*3/uL (ref 150–400)
RBC: 2.86 MIL/uL — ABNORMAL LOW (ref 4.22–5.81)
RDW: 15.3 % (ref 11.5–15.5)
WBC: 9 10*3/uL (ref 4.0–10.5)
nRBC: 0 % (ref 0.0–0.2)

## 2022-01-31 MED ORDER — DEXAMETHASONE 4 MG PO TABS
20.0000 mg | ORAL_TABLET | Freq: Once | ORAL | Status: AC
  Administered 2022-01-31: 20 mg via ORAL
  Filled 2022-01-31: qty 5

## 2022-01-31 MED ORDER — DARATUMUMAB-HYALURONIDASE-FIHJ 1800-30000 MG-UT/15ML ~~LOC~~ SOLN
1800.0000 mg | Freq: Once | SUBCUTANEOUS | Status: AC
  Administered 2022-01-31: 1800 mg via SUBCUTANEOUS
  Filled 2022-01-31: qty 15

## 2022-01-31 MED ORDER — BORTEZOMIB CHEMO SQ INJECTION 3.5 MG (2.5MG/ML)
1.3000 mg/m2 | Freq: Once | INTRAMUSCULAR | Status: AC
  Administered 2022-01-31: 2.5 mg via SUBCUTANEOUS
  Filled 2022-01-31: qty 1

## 2022-01-31 MED ORDER — ACETAMINOPHEN 325 MG PO TABS
650.0000 mg | ORAL_TABLET | Freq: Once | ORAL | Status: AC
  Administered 2022-01-31: 650 mg via ORAL
  Filled 2022-01-31: qty 2

## 2022-01-31 MED ORDER — DIPHENHYDRAMINE HCL 25 MG PO CAPS
50.0000 mg | ORAL_CAPSULE | Freq: Once | ORAL | Status: AC
  Administered 2022-01-31: 50 mg via ORAL
  Filled 2022-01-31: qty 2

## 2022-01-31 NOTE — Patient Instructions (Signed)
[facility]  Discharge Instructions: Thank you for choosing [facility] to provide your oncology and hematology care.   If you have a lab appointment with the [facility], please go directly to the [facility] and check in at the registration area.   Wear comfortable clothing and clothing appropriate for easy access to any Portacath or PICC line.   We strive to give you quality time with your provider. You may need to reschedule your appointment if you arrive late (15 or more minutes).  Arriving late affects you and other patients whose appointments are after yours.  Also, if you miss three or more appointments without notifying the office, you may be dismissed from the clinic at the providers discretion.      For prescription refill requests, have your pharmacy contact our office and allow 72 hours for refills to be completed.    Today you received the following chemotherapy and/or immunotherapy agents: Velcade and Darzalex Faspro      To help prevent nausea and vomiting after your treatment, we encourage you to take your nausea medication as directed.  BELOW ARE SYMPTOMS THAT SHOULD BE REPORTED IMMEDIATELY: *FEVER GREATER THAN 100.4 F (38 C) OR HIGHER *CHILLS OR SWEATING *NAUSEA AND VOMITING THAT IS NOT CONTROLLED WITH YOUR NAUSEA MEDICATION *UNUSUAL SHORTNESS OF BREATH *UNUSUAL BRUISING OR BLEEDING *URINARY PROBLEMS (pain or burning when urinating, or frequent urination) *BOWEL PROBLEMS (unusual diarrhea, constipation, pain near the anus) TENDERNESS IN MOUTH AND THROAT WITH OR WITHOUT PRESENCE OF ULCERS (sore throat, sores in mouth, or a toothache) UNUSUAL RASH, SWELLING OR PAIN  UNUSUAL VAGINAL DISCHARGE OR ITCHING   Items with * indicate a potential emergency and should be followed up as soon as possible or go to the Emergency Department if any problems should occur.  Please show the CHEMOTHERAPY ALERT CARD or IMMUNOTHERAPY ALERT  CARD at check-in to the Emergency Department and triage nurse.  Should you have questions after your visit or need to cancel or reschedule your appointment, please contact [facility]  Dept: [phone]  and follow the prompts.  Office hours are 8:00 a.m. to 4:30 p.m. Monday - Friday. Please note that voicemails left after 4:00 p.m. may not be returned until the following business day.  We are closed weekends and major holidays. You have access to a nurse at all times for urgent questions. Please call the main number to the clinic Dept: [phone] and follow the prompts.   For any non-urgent questions, you may also contact your provider using MyChart. We now offer e-Visits for anyone 72 and older to request care online for non-urgent symptoms. For details visit [email or web address].   Also download the MyChart app! Go to the app store, search "MyChart", open the app, select [facility], and log in with your MyChart username and password.  Due to Covid, a mask is required upon entering the hospital/clinic. If you do not have a mask, one will be given to you upon arrival. For doctor visits, patients may have 1 support person aged 72 or older with them. For treatment visits, patients cannot have anyone with them due to current Covid guidelines and our immunocompromised population.

## 2022-01-31 NOTE — Progress Notes (Signed)
Pt observed taking 450 mg of home PO Cytoxan in infusion room. Verified by this RN and [name], RN.

## 2022-01-31 NOTE — Progress Notes (Signed)
[place]  Telephone:(336) [phone] Fax:(336) [phone]   Name: Caleb Simmons Date: 01/31/2022 MRN: [id number]  DOB: 1951/10/16  Patient Care Team: Pcp, No as PCP - General Buntin, Lavonda Jumbo, RN as Registered Nurse Pickenpack-Cousar, Carlena Sax, NP as Nurse Practitioner (Nurse Practitioner)    INTERVAL HISTORY: Caleb Simmons is a 71 y.o. 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  Recently admitted and discharged on 01/20/22 after receiving treatment for left MCA CVA. Palliative following for ongoing goals of care discussions and symptom management.   SOCIAL HISTORY:     reports that he has been smoking cigarettes. He has a 6.25 pack-year smoking history. He has never used smokeless tobacco.  ADVANCE DIRECTIVES:  Patient reports completed document.  Son Caleb, Simmons. is his healthcare power of attorney.  CODE STATUS: Full code  PAST MEDICAL HISTORY:History reviewed. No pertinent past medical history.  ALLERGIES:  has No Known Allergies.  MEDICATIONS:  Current Outpatient Medications  Medication Sig Dispense Refill   acetaminophen (TYLENOL) 325 MG tablet Take 2 tablets (650 mg total) by mouth every 6 (six) hours as needed for moderate pain (headache).     ALPRAZolam (XANAX) 0.5 MG tablet TAKE 1 TABLET BY MOUTH 2 TIMES DAILY AS NEEDED FOR ANXIETY OR SLEEP 60 tablet 0   aspirin 81 MG EC tablet Take 1 tablet (81 mg total) by mouth daily. Swallow whole. 30 tablet 11   atorvastatin (LIPITOR) 40 MG tablet Take 1 tablet (40 mg total) by mouth at bedtime. 90 tablet 1   calcitRIOL (ROCALTROL) 0.25 MCG capsule Take 1 capsule (0.25 mcg total) by mouth every Monday, Wednesday, and Friday with hemodialysis. 30 capsule 1   cyclophosphamide (CYTOXAN) 50 MG capsule Take 9 capsules (450 mg total) by mouth once a week. Take with food to  minimize GI upset. Take early in the day and maintain hydration. 36 capsule 1   folic acid (FOLVITE) 1 MG tablet TAKE 2 TABLETS BY MOUTH EVERY DAY 60 tablet 0   Melatonin 10 MG TABS Take 10 mg by mouth at bedtime.     midodrine (PROAMATINE) 5 MG tablet Take 1 tablet (5 mg total) by mouth 3 (three) times daily with meals. 270 tablet 3   mirtazapine (REMERON) 30 MG tablet Take 1 tablet (30 mg total) by mouth at bedtime. 30 tablet 1   ondansetron (ZOFRAN) 4 MG tablet Take 1 tablet (4 mg total) by mouth every 6 (six) hours as needed for nausea or vomiting. 30 tablet 2   oxyCODONE (OXY IR/ROXICODONE) 5 MG immediate release tablet Take 1 tablet (5 mg total) by mouth every 6 (six) hours as needed for severe pain. 60 tablet 0   pantoprazole (PROTONIX) 40 MG tablet TAKE 1 TABLET BY MOUTH 2 TIMES DAILY BEFORE A meal 60 tablet 0   prochlorperazine (COMPAZINE) 5 MG tablet Take 1 tablet (5 mg total) by mouth every 6 (six) hours as needed for nausea or vomiting. 30 tablet 2   sevelamer carbonate (RENVELA) 800 MG tablet Take 800 mg by mouth 3 (three) times daily with meals.     tamsulosin (FLOMAX) 0.4 MG CAPS capsule TAKE 1 CAPSULE BY MOUTH EVERY DAY 30 capsule 0   ticagrelor (BRILINTA) 90 MG TABS tablet Take 1 tablet (90 mg total) by mouth 2 (two) times daily. 60 tablet 0   zolpidem (AMBIEN) 5 MG tablet  Take 1 tablet (5 mg total) by mouth at bedtime as needed for sleep. 30 tablet 0   No current facility-administered medications for this visit.   Facility-Administered Medications Ordered in Other Visits  Medication Dose Route Frequency Provider Last Rate Last Admin   heparin lock flush 100 unit/mL  500 Units Intracatheter Once Truitt Merle, MD       sodium chloride flush (NS) 0.9 % injection 10 mL  10 mL Intracatheter Once Truitt Merle, MD        VITAL SIGNS: There were no vitals taken for this visit. There were no vitals filed for this visit.   Estimated body mass index is 23.6 kg/m as calculated from the  following:   Height as of 01/10/22: 5\' 10"  (1.778 m).   Weight as of an earlier encounter on 01/31/22: 164 lb 8 oz (74.6 kg).   PERFORMANCE STATUS (ECOG) : 2 - Symptomatic, <50% confined to bed   Physical Exam General: NAD Cardiovascular: RRR, heart monitor in place  Pulmonary: clear ant fields Abdomen: soft, nontender, + bowel sounds Extremities: no edema, no joint deformities Neurological: Weakness but otherwise nonfocal  IMPRESSION:  I saw [name] during his infusion today.  No acute distress noted.  He shares he recently was discharged from the hospital after having a stroke.  No obvious residual effects.  He feels well. Says he was with his family and began having slurred speech and they immediately responded. He is wearing a heart monitor.   Pain Cali reports pain is well controlled on current regimen (Oxy IR 5mg ). He is tolerating medications and when he does have pain it is appropriately resolved with medication.    Decreased Appetite Tolerating Remeron 30 mg. His appetite has decreased some given recent events but he feels it is improving again. He has been able to eat more since he was discharged. We will continue to closely monitor.   Constipation Is actively taking Miralax daily for bowel regimen. Denies any concerns with constipation but can tell a difference when missing a dose.    Insomnia Reports resting well. Much improved. Able to sleep throughout the night and will occasionally take a nap during the day.   PLAN: Pain well controlled. No changes or refills needed at this time.  All symptom management needs are stable with no adjustments required.   I will plan to see him back in 3-4 weeks in collaboration with his other oncology appointments.   Patient expressed understanding and was in agreement with this plan. He also understands that He can call the clinic at any time with any questions, concerns, or complaints.    Time Total: 20 min.   Visit consisted  of counseling and education dealing with the complex and emotionally intense issues of symptom management and palliative care in the setting of serious and potentially life-threatening illness.Greater than 50%  of this time was spent counseling and coordinating care related to the above assessment and plan.  Signed by: Alda Lea, AGPCNP-BC [place]

## 2022-01-31 NOTE — Telephone Encounter (Signed)
CRITICAL VALUE STICKER  CRITICAL VALUE:  creatinine 3.24  RECEIVER (on-site recipient of call): Rondel Baton, LPN  [place] NOTIFIED: 01/31/22  09:02  MESSENGER (representative from lab):  Verdis Frederickson  MD NOTIFIED: Burr Medico  TIME OF NOTIFICATION: 09:05  RESPONSE: Dr was aware

## 2022-01-31 NOTE — Progress Notes (Signed)
[place]   [phone]) [phone] Fax:(336) [phone]   Clinic Follow up Note   Patient Care Team: Pcp, No as PCP - General Buntin, Lavonda Jumbo, RN as Registered Nurse Pickenpack-Cousar, Carlena Sax, NP as Nurse Practitioner (Nurse Practitioner)  Date of Service:  01/31/2022  CHIEF COMPLAINT: f/u of AL amyloidosis with multiple organs involvement  CURRENT THERAPY:  CyBorD (Dexa, Velcade and Cytoxan), started 09/10/21 -daratumumab injection added 09/27/21  ASSESSMENT & PLAN:  Caleb Simmons is a 71 y.o. male with   1. AL amyloidosis with renal, cardiac and neuro involvement  -initially presented with weakness and SOB; lab work up showed BUN of 44, Cr 6.5, significant proteinuria/RBC on UA. Right renal biopsy on 08/28/21 revealing AL amyloidosis, lambda light chain composition. 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 Both serum and urine lamda Free light chains significantly elevated. -Due to the multiorgan involvement, with significant CHF (EF 45-50%) and renal failure, his prognosis is very poor. -He began weekly oral Cytoxan, dexa and Velcade injection while inpatient on 09/10/21.  He is tolerating well so far, plan to continue for 6 months -He began daratumumab injection 09/27/21. -his light chain levels showed improvement on repeat labs on 10/04/21. (Last two labs were not able to be processed due to being grossly lipemic) -he is tolerating chemo well, dexa decreased from 87m to 268mweekly due to insomnia  -not a candidate for biphosphonate due to renal failure -he continues to tolerate treatment well overall. -He has recovered well from recent stroke, will resume chemo today  -plan to change to single Dara maintenance therapy in 2 months    2. Symptom management: Nausea, Insomnia, Low appetite, constipation, anxiety -zofran helps his nausea -he was prescribed remeron and ambien on 10/18/21 by NP NiLexine BatonThey do not feel the remeron is helping. She  increased his dose to 30 mg on 12/27/21 -he is sleeping better with Ambien and Xanax   3. Orthostatic Hypotension and CHF  -Likely secondary to amyloidosis with neuro involvement  -He had a significant symptomatic orthostasis, SBP drops to 70's when he stands up -he met with Dr. McAundra Dubinn 10/23/21 and was started on midodrine.   4. 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 ESRD on HD, secondary to #1 -he is on dialysis Mon, Wed, Fri. -I spoke with his nephrologist to see if he can do less dialysis since his amyloidosis has improved. At this time, it's not recommended, but maybe at a later time. -creatinine has been stable in 3-range.  6. 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 MRI showed numerous infarctions scattered within left MCA. 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 He did not require stent. -f/u with IR      PLAN: -Proceed with Cycle 5 day 1 CyBorD and Dara injection today alone with cytoxan and dexa  -lab and Velcade injections weekly x8 -Dara injection in 2, 4, and 6 and 8 weeks -f/u in 2 and 4 weeks then monthly              -they prefer morning appointments if possible   No problem-specific Assessment &  Plan notes found for this encounter.   INTERVAL HISTORY:  Caleb Simmons is here for a follow up of AL amyloidosis. He was last seen by me on 01/15/22 while he was in the hospital. He presents to the clinic accompanied by his wife. He reports he is feeling much better than a few weeks ago. He notes he did not need to have a stent placed. They report he is more fatigued  and weak than before his hospital stay. He notes he is eating well. His wife reports he is not having bowel movements as frequently. He notes his last BM was yesterday. He states he is ready to restart treatment today. He denies any numbness or tingling to his fingers.   All other systems were reviewed with the patient and are negative.  MEDICAL HISTORY:  History reviewed. No pertinent past medical history.  SURGICAL HISTORY: Past Surgical History:  Procedure Laterality Date   APPENDECTOMY     IR ANGIO INTRA EXTRACRAN SEL COM CAROTID INNOMINATE BILAT MOD SED  01/16/2022   IR ANGIO VERTEBRAL SEL SUBCLAVIAN INNOMINATE BILAT MOD SED  01/16/2022   IR CT HEAD LTD  01/18/2022   IR EMBO ART  VEN HEMORR LYMPH EXTRAV  INC GUIDE ROADMAPPING  08/30/2021   IR FLUORO GUIDE CV LINE RIGHT  08/30/2021   IR FLUORO GUIDE CV LINE RIGHT  09/18/2021   IR PERCUTANEOUS ART THROMBECTOMY/INFUSION INTRACRANIAL INC DIAG ANGIO  01/18/2022   IR RENAL SELECTIVE  UNI INC S&I MOD SED  08/31/2021   IR US GUIDE VASC ACCESS RIGHT  08/30/2021   IR US GUIDE VASC ACCESS RIGHT  08/30/2021   IR US GUIDE VASC ACCESS RIGHT  09/18/2021   IR US GUIDE VASC ACCESS RIGHT  01/16/2022   RADIOLOGY WITH ANESTHESIA N/A 01/18/2022   Procedure: Cerebral angioplasty with possible stenting;  Surgeon: Luanne Bras, MD;  Location: [facility];  Service: Radiology;  Laterality: N/A;    I have reviewed the social history and family history with the patient and they are unchanged from previous note.  ALLERGIES:  has No Known Allergies.  MEDICATIONS:  Current Outpatient Medications  Medication Sig Dispense Refill   acetaminophen (TYLENOL) 325 MG tablet Take 2 tablets (650 mg total) by mouth every 6 (six) hours as needed for moderate pain (headache).     ALPRAZolam (XANAX) 0.5 MG tablet TAKE 1 TABLET BY MOUTH 2 TIMES DAILY AS NEEDED FOR ANXIETY OR SLEEP 60 tablet 0   aspirin 81 MG EC tablet Take 1 tablet (81 mg total) by mouth daily. Swallow whole. 30 tablet  11   atorvastatin (LIPITOR) 40 MG tablet Take 1 tablet (40 mg total) by mouth at bedtime. 90 tablet 1   calcitRIOL (ROCALTROL) 0.25 MCG capsule Take 1 capsule (0.25 mcg total) by mouth every Monday, Wednesday, and Friday with hemodialysis. 30 capsule 1   cyclophosphamide (CYTOXAN) 50 MG capsule Take 9 capsules (450 mg total) by mouth once a week. Take with food to minimize GI upset. Take early in the day and maintain hydration. 36 capsule 1   folic acid (FOLVITE) 1 MG tablet TAKE 2 TABLETS BY MOUTH EVERY DAY 60 tablet 0   Melatonin 10 MG TABS Take 10 mg by mouth at bedtime.     midodrine (PROAMATINE) 5 MG tablet Take 1 tablet (5 mg total) by mouth 3 (three) times daily with meals. 270 tablet 3   mirtazapine (REMERON) 30 MG tablet Take 1 tablet (30 mg total) by mouth at bedtime. [place]  tablet 1   ondansetron (ZOFRAN) 4 MG tablet Take 1 tablet (4 mg total) by mouth every 6 (six) hours as needed for nausea or vomiting. 30 tablet 2   oxyCODONE (OXY IR/ROXICODONE) 5 MG immediate release tablet Take 1 tablet (5 mg total) by mouth every 6 (six) hours as needed for severe pain. 60 tablet 0   pantoprazole (PROTONIX) 40 MG tablet TAKE 1 TABLET BY MOUTH 2 TIMES DAILY BEFORE A meal 60 tablet 0   prochlorperazine (COMPAZINE) 5 MG tablet Take 1 tablet (5 mg total) by mouth every 6 (six) hours as needed for nausea or vomiting. 30 tablet 2   sevelamer carbonate (RENVELA) 800 MG tablet Take 800 mg by mouth 3 (three) times daily with meals.     tamsulosin (FLOMAX) 0.4 MG CAPS capsule TAKE 1 CAPSULE BY MOUTH EVERY DAY 30 capsule 0   ticagrelor (BRILINTA) 90 MG TABS tablet Take 1 tablet (90 mg total) by mouth 2 (two) times daily. 60 tablet 0   zolpidem (AMBIEN) 5 MG tablet Take 1 tablet (5 mg total) by mouth at bedtime as needed for sleep. 30 tablet 0   No current facility-administered medications for this visit.   Facility-Administered Medications Ordered in Other Visits  Medication Dose Route Frequency Provider Last  Rate Last Admin   bortezomib SQ (VELCADE) chemo injection (2.53m/mL concentration) 2.5 mg  1.3 mg/m2 (Treatment Plan Recorded) Subcutaneous Once FTruitt Merle MD       daratumumab-hyaluronidase-fihj ([facility]) 1800-30000 MG-UT/15ML chemo SQ injection 1,800 mg  1,800 mg Subcutaneous Once FTruitt Merle MD       heparin lock flush 100 unit/mL  500 Units Intracatheter Once FTruitt Merle MD       sodium chloride flush (NS) 0.9 % injection 10 mL  10 mL Intracatheter Once FTruitt Merle MD        PHYSICAL EXAMINATION: ECOG PERFORMANCE STATUS: 3 - Symptomatic, >50% confined to bed  Vitals:   01/31/22 0817  BP: 108/66  Pulse: (!) 106  Resp: 16  Temp: 98.4 F (36.9 C)  SpO2: 98%   Wt Readings from Last 3 Encounters:  01/31/22 164 lb 8 oz (74.6 kg)  01/22/22 166 lb (75.3 kg)  01/16/22 163 lb 2.3 oz (74 kg)     GENERAL:alert, no distress and comfortable SKIN: skin color normal, no rashes or significant lesions EYES: normal, Conjunctiva are pink and non-injected, sclera clear  NEURO: alert & oriented x 3 with fluent speech  LABORATORY DATA:  I have reviewed the data as listed CBC Latest Ref Rng & Units 01/31/2022 01/20/2022 01/19/2022  WBC 4.0 - 10.5 K/uL 9.0 6.6 -  Hemoglobin 13.0 - 17.0 g/dL 8.4(L) 7.8(L) 8.3(L)  Hematocrit 39.0 - 52.0 % 26.3(L) 23.4(L) 24.3(L)  Platelets 150 - 400 K/uL 203 178 -     CMP Latest Ref Rng & Units 01/31/2022 01/20/2022 01/19/2022  Glucose 70 - 99 mg/dL 134(H) 92 109(H)  BUN 8 - 23 mg/dL 13 25(H) 35(H)  Creatinine 0.61 - 1.24 mg/dL 3.24(HH) 3.63(H) 5.12(H)  Sodium 135 - 145 mmol/L 139 136 135  Potassium 3.5 - 5.1 mmol/L 4.1 4.3 5.3(H)  Chloride 98 - 111 mmol/L 102 106 108  CO2 22 - 32 mmol/L 30 22 18(L)  Calcium 8.9 - 10.3 mg/dL 8.7(L) 7.8(L) 8.1(L)  Total Protein 6.5 - 8.1 g/dL 5.9(L) - -  Total Bilirubin 0.3 - 1.2 mg/dL 0.3 - -  Alkaline Phos 38 - 126 U/L 83 - -  AST 15 - 41 U/L 8(L) - -  ALT 0 - 44 U/L 5 - -      RADIOGRAPHIC STUDIES: I have  personally reviewed the radiological images as listed and agreed with the findings in the report. No results found.    No orders of the defined types were placed in this encounter.  All questions were answered. The patient knows to call the clinic with any problems, questions or concerns. No barriers to learning was detected. The total time spent in the appointment was 30 minutes.     Truitt Merle, MD 01/31/2022   I, Wilburn Mylar, am acting as scribe for Truitt Merle, MD.   I have reviewed the above documentation for accuracy and completeness, and I agree with the above.

## 2022-02-01 ENCOUNTER — Telehealth: Payer: Self-pay | Admitting: Hematology

## 2022-02-01 NOTE — Telephone Encounter (Signed)
Left message with follow-up appointments per 2/9 los.

## 2022-02-05 ENCOUNTER — Encounter (HOSPITAL_COMMUNITY): Payer: Commercial Managed Care - PPO

## 2022-02-05 ENCOUNTER — Encounter: Payer: Commercial Managed Care - PPO | Admitting: Vascular Surgery

## 2022-02-05 ENCOUNTER — Other Ambulatory Visit (HOSPITAL_COMMUNITY): Payer: Commercial Managed Care - PPO

## 2022-02-07 ENCOUNTER — Inpatient Hospital Stay: Payer: Commercial Managed Care - PPO

## 2022-02-07 ENCOUNTER — Ambulatory Visit: Payer: Commercial Managed Care - PPO | Admitting: Hematology

## 2022-02-07 ENCOUNTER — Other Ambulatory Visit: Payer: Self-pay | Admitting: Hematology

## 2022-02-07 ENCOUNTER — Other Ambulatory Visit: Payer: Self-pay

## 2022-02-07 ENCOUNTER — Telehealth: Payer: Self-pay

## 2022-02-07 VITALS — BP 94/68 | HR 100 | Temp 98.4°F | Resp 18 | Wt 165.0 lb

## 2022-02-07 DIAGNOSIS — Z5112 Encounter for antineoplastic immunotherapy: Secondary | ICD-10-CM | POA: Diagnosis not present

## 2022-02-07 DIAGNOSIS — E8581 Light chain (AL) amyloidosis: Secondary | ICD-10-CM

## 2022-02-07 LAB — CBC WITH DIFFERENTIAL/PLATELET
Abs Immature Granulocytes: 0.04 10*3/uL (ref 0.00–0.07)
Basophils Absolute: 0.1 10*3/uL (ref 0.0–0.1)
Basophils Relative: 1 %
Eosinophils Absolute: 0.2 10*3/uL (ref 0.0–0.5)
Eosinophils Relative: 2 %
HCT: 26.9 % — ABNORMAL LOW (ref 39.0–52.0)
Hemoglobin: 8.6 g/dL — ABNORMAL LOW (ref 13.0–17.0)
Immature Granulocytes: 0 %
Lymphocytes Relative: 17 %
Lymphs Abs: 1.6 10*3/uL (ref 0.7–4.0)
MCH: 29.9 pg (ref 26.0–34.0)
MCHC: 32 g/dL (ref 30.0–36.0)
MCV: 93.4 fL (ref 80.0–100.0)
Monocytes Absolute: 0.7 10*3/uL (ref 0.1–1.0)
Monocytes Relative: 8 %
Neutro Abs: 6.5 10*3/uL (ref 1.7–7.7)
Neutrophils Relative %: 72 %
Platelets: 216 10*3/uL (ref 150–400)
RBC: 2.88 MIL/uL — ABNORMAL LOW (ref 4.22–5.81)
RDW: 15.9 % — ABNORMAL HIGH (ref 11.5–15.5)
WBC: 9.1 10*3/uL (ref 4.0–10.5)
nRBC: 0 % (ref 0.0–0.2)

## 2022-02-07 LAB — COMPREHENSIVE METABOLIC PANEL
ALT: 5 U/L (ref 0–44)
AST: 10 U/L — ABNORMAL LOW (ref 15–41)
Albumin: 3.4 g/dL — ABNORMAL LOW (ref 3.5–5.0)
Alkaline Phosphatase: 84 U/L (ref 38–126)
Anion gap: 8 (ref 5–15)
BUN: 18 mg/dL (ref 8–23)
CO2: 27 mmol/L (ref 22–32)
Calcium: 8.4 mg/dL — ABNORMAL LOW (ref 8.9–10.3)
Chloride: 104 mmol/L (ref 98–111)
Creatinine, Ser: 3.67 mg/dL (ref 0.61–1.24)
GFR, Estimated: 17 mL/min — ABNORMAL LOW (ref >60)
Glucose, Bld: 131 mg/dL — ABNORMAL HIGH (ref 70–99)
Potassium: 4 mmol/L (ref 3.5–5.1)
Sodium: 139 mmol/L (ref 135–145)
Total Bilirubin: 0.3 mg/dL (ref 0.3–1.2)
Total Protein: 5.6 g/dL — ABNORMAL LOW (ref 6.5–8.1)

## 2022-02-07 MED ORDER — DEXAMETHASONE 4 MG PO TABS
20.0000 mg | ORAL_TABLET | Freq: Once | ORAL | Status: AC
  Administered 2022-02-07: 20 mg via ORAL
  Filled 2022-02-07: qty 5

## 2022-02-07 MED ORDER — BORTEZOMIB CHEMO SQ INJECTION 3.5 MG (2.5MG/ML)
1.3000 mg/m2 | Freq: Once | INTRAMUSCULAR | Status: AC
  Administered 2022-02-07: 2.5 mg via SUBCUTANEOUS
  Filled 2022-02-07: qty 1

## 2022-02-07 NOTE — Patient Instructions (Signed)
Bortezomib injection What is this medication? BORTEZOMIB (bor TEZ oh mib) targets proteins in cancer cells and stops thecancer cells from growing. It treats multiple myeloma and mantle cell lymphoma. This medicine may be used for other purposes; ask your health care provider orpharmacist if you have questions. COMMON BRAND NAME(S): Velcade What should I tell my care team before I take this medication? They need to know if you have any of these conditions: dehydration diabetes (high blood sugar) heart disease liver disease tingling of the fingers or toes or other nerve disorder an unusual or allergic reaction to bortezomib, mannitol, boron, other medicines, foods, dyes, or preservatives pregnant or trying to get pregnant breast-feeding How should I use this medication? This medicine is injected into a vein or under the skin. It is given by ahealth care provider in a hospital or clinic setting. Talk to your health care provider about the use of this medicine in children.Special care may be needed. Overdosage: If you think you have taken too much of this medicine contact apoison control center or emergency room at once. NOTE: This medicine is only for you. Do not share this medicine with others. What if I miss a dose? Keep appointments for follow-up doses. It is important not to miss your dose.Call your health care provider if you are unable to keep an appointment. What may interact with this medication? This medicine may interact with the following medications: ketoconazole rifampin This list may not describe all possible interactions. Give your health care provider a list of all the medicines, herbs, non-prescription drugs, or dietary supplements you use. Also tell them if you smoke, drink alcohol, or use illegaldrugs. Some items may interact with your medicine. What should I watch for while using this medication? Your condition will be monitored carefully while you are receiving  thismedicine. You may need blood work done while you are taking this medicine. You may get drowsy or dizzy. Do not drive, use machinery, or do anything that needs mental alertness until you know how this medicine affects you. Do not stand up or sit up quickly, especially if you are an older patient. Thisreduces the risk of dizzy or fainting spells This medicine may increase your risk of getting an infection. Call your health care provider for advice if you get a fever, chills, sore throat, or other symptoms of a cold or flu. Do not treat yourself. Try to avoid being aroundpeople who are sick. Check with your health care provider if you have severe diarrhea, nausea, and vomiting, or if you sweat a lot. The loss of too much body fluid may make itdangerous for you to take this medicine. Do not become pregnant while taking this medicine or for 7 months after stopping it. Women should inform their health care provider if they wish to become pregnant or think they might be pregnant. Men should not father a child while taking this medicine and for 4 months after stopping it. There is a potential for serious harm to an unborn child. Talk to your health care provider for more information. Do not breast-feed an infant while taking thismedicine or for 2 months after stopping it. This medicine may make it more difficult to get pregnant or father a child.Talk to your health care provider if you are concerned about your fertility. What side effects may I notice from receiving this medication? Side effects that you should report to your doctor or health care professionalas soon as possible: allergic reactions (skin rash; itching or hives; swelling   care professional as soon as possible: ?allergic reactions (skin rash; itching or hives; swelling of the face, lips, or tongue) ?bleeding (bloody or black, tarry stools; red or dark brown urine; spitting up blood or brown material that looks like coffee grounds; red spots on the skin; unusual bruising or bleeding from the eye, gums, or nose) ?blurred vision or changes  in vision ?confusion ?constipation ?headache ?heart failure (trouble breathing; fast, irregular heartbeat; sudden weight gain; swelling of the ankles, feet, hands) ?infection (fever, chills, cough, sore throat, pain or trouble passing urine) ?lack or loss of appetite ?liver injury (dark yellow or brown urine; general ill feeling or flu-like symptoms; loss of appetite, right upper belly pain; yellowing of the eyes or skin) ?low blood pressure (dizziness; feeling faint or lightheaded, falls; unusually weak or tired) ?muscle cramps ?pain, redness, or irritation at site where injected ?pain, tingling, numbness in the hands or feet ?seizures ?trouble breathing ?unusual bruising or bleeding ?Side effects that usually do not require medical attention (report to your doctor or health care professional if they continue or are bothersome): ?diarrhea ?nausea ?stomach pain ?trouble sleeping ?vomiting ?This list may not describe all possible side effects. Call your doctor for medical advice about side effects. You may report side effects to FDA at 1-800-FDA-1088. ?Where should I keep my medication? ?This medicine is given in a hospital or clinic. It will not be stored at home. ?NOTE: This sheet is a summary. It may not cover all possible information. If you have questions about this medicine, talk to your doctor, pharmacist, or health care provider. ?? 2022 Elsevier/Gold Standard (2020-11-30 00:00:00) ?

## 2022-02-07 NOTE — Telephone Encounter (Signed)
CRITICAL VALUE STICKER  CRITICAL VALUE:  Creatinine 3.67  RECEIVER (on-site recipient of call): Lorelee Mclaurin P. LPN  DATE & TIME NOTIFIED: 02/07/22  8:36am  MESSENGER (representative from lab): Ulice Dash  MD NOTIFIED: Dr. Burr Medico

## 2022-02-07 NOTE — Progress Notes (Signed)
RN observed pt take correct dose of oral chemo Cytoxan, 450mg .

## 2022-02-07 NOTE — Progress Notes (Signed)
Per Burr Medico MD, ok to treat with SCR 3.67

## 2022-02-08 LAB — KAPPA/LAMBDA LIGHT CHAINS
Kappa free light chain: 43.9 mg/L — ABNORMAL HIGH (ref 3.3–19.4)
Kappa, lambda light chain ratio: 1.57 (ref 0.26–1.65)
Lambda free light chains: 27.9 mg/L — ABNORMAL HIGH (ref 5.7–26.3)

## 2022-02-12 LAB — MULTIPLE MYELOMA PANEL, SERUM
Albumin SerPl Elph-Mcnc: 3 g/dL (ref 2.9–4.4)
Albumin/Glob SerPl: 1.3 (ref 0.7–1.7)
Alpha 1: 0.3 g/dL (ref 0.0–0.4)
Alpha2 Glob SerPl Elph-Mcnc: 1 g/dL (ref 0.4–1.0)
B-Globulin SerPl Elph-Mcnc: 0.7 g/dL (ref 0.7–1.3)
Gamma Glob SerPl Elph-Mcnc: 0.3 g/dL — ABNORMAL LOW (ref 0.4–1.8)
Globulin, Total: 2.4 g/dL (ref 2.2–3.9)
IgA: 36 mg/dL — ABNORMAL LOW (ref 61–437)
IgG (Immunoglobin G), Serum: 304 mg/dL — ABNORMAL LOW (ref 603–1613)
IgM (Immunoglobulin M), Srm: 61 mg/dL (ref 15–143)
M Protein SerPl Elph-Mcnc: 0.1 g/dL — ABNORMAL HIGH
Total Protein ELP: 5.4 g/dL — ABNORMAL LOW (ref 6.0–8.5)

## 2022-02-12 NOTE — Addendum Note (Signed)
Encounter addended by: Micki Riley, RN on: 02/12/2022 4:58 PM  Actions taken: Imaging Exam ended

## 2022-02-12 NOTE — Addendum Note (Signed)
Encounter addended by: Micki Riley, RN on: 02/12/2022 4:57 PM  Actions taken: Imaging Exam ended

## 2022-02-14 ENCOUNTER — Inpatient Hospital Stay: Payer: Commercial Managed Care - PPO

## 2022-02-14 ENCOUNTER — Encounter: Payer: Self-pay | Admitting: Hematology

## 2022-02-14 ENCOUNTER — Other Ambulatory Visit: Payer: Self-pay

## 2022-02-14 ENCOUNTER — Inpatient Hospital Stay (HOSPITAL_BASED_OUTPATIENT_CLINIC_OR_DEPARTMENT_OTHER): Payer: Commercial Managed Care - PPO | Admitting: Hematology

## 2022-02-14 VITALS — BP 89/71 | HR 105 | Temp 98.1°F | Resp 18 | Wt 161.5 lb

## 2022-02-14 DIAGNOSIS — E8581 Light chain (AL) amyloidosis: Secondary | ICD-10-CM

## 2022-02-14 DIAGNOSIS — Z5112 Encounter for antineoplastic immunotherapy: Secondary | ICD-10-CM | POA: Diagnosis not present

## 2022-02-14 LAB — CBC WITH DIFFERENTIAL/PLATELET
Abs Immature Granulocytes: 0.02 10*3/uL (ref 0.00–0.07)
Basophils Absolute: 0.1 10*3/uL (ref 0.0–0.1)
Basophils Relative: 1 %
Eosinophils Absolute: 0.2 10*3/uL (ref 0.0–0.5)
Eosinophils Relative: 3 %
HCT: 28.3 % — ABNORMAL LOW (ref 39.0–52.0)
Hemoglobin: 9.1 g/dL — ABNORMAL LOW (ref 13.0–17.0)
Immature Granulocytes: 0 %
Lymphocytes Relative: 17 %
Lymphs Abs: 1.3 10*3/uL (ref 0.7–4.0)
MCH: 29.9 pg (ref 26.0–34.0)
MCHC: 32.2 g/dL (ref 30.0–36.0)
MCV: 93.1 fL (ref 80.0–100.0)
Monocytes Absolute: 0.7 10*3/uL (ref 0.1–1.0)
Monocytes Relative: 9 %
Neutro Abs: 5.3 10*3/uL (ref 1.7–7.7)
Neutrophils Relative %: 70 %
Platelets: 189 10*3/uL (ref 150–400)
RBC: 3.04 MIL/uL — ABNORMAL LOW (ref 4.22–5.81)
RDW: 16.4 % — ABNORMAL HIGH (ref 11.5–15.5)
WBC: 7.5 10*3/uL (ref 4.0–10.5)
nRBC: 0 % (ref 0.0–0.2)

## 2022-02-14 LAB — COMPREHENSIVE METABOLIC PANEL
ALT: 5 U/L (ref 0–44)
AST: 9 U/L — ABNORMAL LOW (ref 15–41)
Albumin: 3.4 g/dL — ABNORMAL LOW (ref 3.5–5.0)
Alkaline Phosphatase: 91 U/L (ref 38–126)
Anion gap: 6 (ref 5–15)
BUN: 17 mg/dL (ref 8–23)
CO2: 27 mmol/L (ref 22–32)
Calcium: 8.7 mg/dL — ABNORMAL LOW (ref 8.9–10.3)
Chloride: 105 mmol/L (ref 98–111)
Creatinine, Ser: 3.67 mg/dL (ref 0.61–1.24)
GFR, Estimated: 17 mL/min — ABNORMAL LOW (ref >60)
Glucose, Bld: 141 mg/dL — ABNORMAL HIGH (ref 70–99)
Potassium: 3.9 mmol/L (ref 3.5–5.1)
Sodium: 138 mmol/L (ref 135–145)
Total Bilirubin: 0.3 mg/dL (ref 0.3–1.2)
Total Protein: 5.7 g/dL — ABNORMAL LOW (ref 6.5–8.1)

## 2022-02-14 MED ORDER — DIPHENHYDRAMINE HCL 25 MG PO CAPS
50.0000 mg | ORAL_CAPSULE | Freq: Once | ORAL | Status: AC
  Administered 2022-02-14: 50 mg via ORAL
  Filled 2022-02-14: qty 2

## 2022-02-14 MED ORDER — DEXAMETHASONE 4 MG PO TABS
20.0000 mg | ORAL_TABLET | Freq: Once | ORAL | Status: AC
  Administered 2022-02-14: 20 mg via ORAL
  Filled 2022-02-14: qty 5

## 2022-02-14 MED ORDER — ACETAMINOPHEN 325 MG PO TABS
650.0000 mg | ORAL_TABLET | Freq: Once | ORAL | Status: AC
  Administered 2022-02-14: 650 mg via ORAL
  Filled 2022-02-14: qty 2

## 2022-02-14 MED ORDER — BORTEZOMIB CHEMO SQ INJECTION 3.5 MG (2.5MG/ML)
1.3000 mg/m2 | Freq: Once | INTRAMUSCULAR | Status: AC
  Administered 2022-02-14: 2.5 mg via SUBCUTANEOUS
  Filled 2022-02-14: qty 1

## 2022-02-14 MED ORDER — DARATUMUMAB-HYALURONIDASE-FIHJ 1800-30000 MG-UT/15ML ~~LOC~~ SOLN
1800.0000 mg | Freq: Once | SUBCUTANEOUS | Status: AC
  Administered 2022-02-14: 1800 mg via SUBCUTANEOUS
  Filled 2022-02-14: qty 15

## 2022-02-14 NOTE — Progress Notes (Signed)
[facility]   Telephone:([phone] Fax:([phone]) [phone]   Clinic Follow up Note   Patient Care Team: Pcp, No as PCP - General Buntin, Lavonda Jumbo, RN as Registered Nurse Pickenpack-Cousar, Carlena Sax, NP as Nurse Practitioner (Nurse Practitioner)  Date of Service:  02/14/2022  CHIEF COMPLAINT: f/u of AL amyloidosis with multiple organs involvement  CURRENT THERAPY:  CyBorD (Dexa, Velcade and Cytoxan), started 09/10/21 -daratumumab injection added 09/27/21  ASSESSMENT & PLAN:  Caleb Simmons is a 71 y.o. male with   1. AL amyloidosis with renal, cardiac and neuro involvement  -initially presented with weakness and SOB; lab work up showed BUN of 44, Cr 6.5, significant proteinuria/RBC on UA. Right renal biopsy on 08/28/21 revealing AL amyloidosis, lambda light chain composition. 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 Both serum and urine lamda Free light chains significantly elevated. -Due to the multiorgan involvement, with significant CHF (EF 45-50%) and renal failure, his prognosis is very poor. -He began weekly oral Cytoxan, dexa and Velcade injection while inpatient on 09/10/21.  He is tolerating well so far, plan to continue for 6 months -He began daratumumab injection 09/27/21. -his light chain levels showed improvement after starting treatment, now overall stable. -not a candidate for biphosphonate due to renal failure -he continues to tolerate treatment well overall. -He has recovered well from recent stroke, I resumed his chemo  -continue weekly CyBorD, he is on Dara every 2 weeks now, plan to change to single Dara maintenance therapy in 2 months (after he completes 6 months CyBorD)   2. Symptom management: Nausea, Insomnia, Low appetite, constipation, anxiety -zofran helps his nausea -he was prescribed remeron and ambien on 10/18/21 by NP Lexine Baton. They do not feel the remeron is helping. She increased his dose to 30 mg on 12/27/21 -dexa decreased from  38m to 248mweekly due to insomnia  -he is sleeping better with Ambien and Xanax   3. Orthostatic Hypotension and CHF  -Likely secondary to amyloidosis with neuro involvement  -He has significant symptomatic orthostasis, SBP drops to 70's when he stands up -he met with Dr. McAundra Dubinn 10/23/21 and was started on midodrine. -BP 89/71 today (02/14/22), taken while pt was sitting.   4. 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 ESRD on HD, secondary to #1 -he is on dialysis Mon, Wed, Fri. -I spoke with his nephrologist to see if he can do less dialysis since his amyloidosis has improved. At this time, it's not recommended, but maybe at a later time. -creatinine has been stable in 3-range.   6. 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 MRI showed numerous infarctions scattered within left MCA. 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 He did not require stent.     PLAN: -Proceed with Cycle 5 day 15 velcade today alone with cytoxan and dexa  -lab and Velcade injections weekly x5, Dara every other week  -f/u with [facility] in 2 weeks -plan to start cycle 6 chemo with maintenance daratumumab every 4 weeks in 6 weeks     No problem-specific Assessment & Plan notes found for this encounter.   INTERVAL HISTORY:  Caleb Simmons here for a follow up of AL amyloidosis. He was last seen by me on 01/31/22. He was  seen in the infusion room. He reports he hasn't been eating as well the last week, but he notes it is improving.   All other systems were reviewed with the patient and are negative.  MEDICAL HISTORY:  History reviewed. No pertinent past medical history.  SURGICAL HISTORY: Past Surgical History:  Procedure  Laterality Date   APPENDECTOMY     IR ANGIO INTRA EXTRACRAN SEL COM CAROTID INNOMINATE BILAT MOD SED  01/16/2022   IR ANGIO VERTEBRAL SEL SUBCLAVIAN INNOMINATE BILAT MOD SED  01/16/2022   IR CT HEAD LTD  01/18/2022   IR EMBO ART  VEN HEMORR LYMPH EXTRAV  INC GUIDE ROADMAPPING  08/30/2021   IR FLUORO GUIDE CV LINE RIGHT  08/30/2021   IR FLUORO GUIDE CV LINE RIGHT  09/18/2021   IR PERCUTANEOUS ART THROMBECTOMY/INFUSION INTRACRANIAL INC DIAG ANGIO  01/18/2022   IR RENAL SELECTIVE  UNI INC S&I MOD SED  08/31/2021   IR US GUIDE VASC ACCESS RIGHT  08/30/2021   IR US GUIDE VASC ACCESS RIGHT  08/30/2021   IR US GUIDE VASC ACCESS RIGHT  09/18/2021   IR US GUIDE VASC ACCESS RIGHT  01/16/2022   RADIOLOGY WITH ANESTHESIA N/A 01/18/2022   Procedure: Cerebral angioplasty with possible stenting;  Surgeon: Luanne Bras, MD;  Location: [facility];  Service: Radiology;  Laterality: N/A;    I have reviewed the social history and family history with the patient and they are unchanged from previous note.  ALLERGIES:  has No Known Allergies.  MEDICATIONS:  Current Outpatient Medications  Medication Sig Dispense Refill   acetaminophen (TYLENOL) 325 MG tablet Take 2 tablets (650 mg total) by mouth every 6 (six) hours as needed for moderate pain (headache).     ALPRAZolam (XANAX) 0.5 MG tablet TAKE 1 TABLET BY MOUTH 2 TIMES DAILY AS NEEDED FOR ANXIETY OR SLEEP 60 tablet 0   aspirin 81 MG EC tablet Take 1 tablet (81 mg total) by mouth daily. Swallow whole. 30 tablet 11   atorvastatin (LIPITOR) 40 MG tablet Take 1 tablet (40 mg total) by mouth at bedtime. 90 tablet 1   calcitRIOL (ROCALTROL) 0.25 MCG capsule Take 1 capsule (0.25 mcg total) by mouth every Monday, Wednesday, and Friday with hemodialysis. 30 capsule 1   cyclophosphamide (CYTOXAN) 50 MG capsule Take 9 capsules (450 mg total) by mouth once a week. Take with food to minimize GI upset. Take early in the day and maintain hydration. 36 capsule 1   folic acid (FOLVITE) 1  MG tablet TAKE 2 TABLETS BY MOUTH EVERY DAY 60 tablet 0   Melatonin 10 MG TABS Take 10 mg by mouth at bedtime.     midodrine (PROAMATINE) 5 MG tablet Take 1 tablet (5 mg total) by mouth 3 (three) times daily with meals. 270 tablet 3   mirtazapine (REMERON) 30 MG tablet Take 1 tablet (30 mg total) by mouth at bedtime. 30 tablet 1   ondansetron (ZOFRAN) 4 MG tablet Take 1 tablet (4 mg total) by mouth every 6 (six) hours as needed for nausea or vomiting. 30 tablet 2   oxyCODONE (OXY IR/ROXICODONE) 5 MG immediate release tablet Take 1 tablet (5 mg total) by mouth every 6 (six) hours as needed for severe pain. 60 tablet 0   pantoprazole (PROTONIX) 40 MG tablet TAKE 1 TABLET BY MOUTH 2 TIMES DAILY BEFORE A meal 60 tablet 0   prochlorperazine (COMPAZINE) 5 MG tablet Take 1 tablet (5 mg total) by mouth every 6 (six) hours as needed  for nausea or vomiting. 30 tablet 2   sevelamer carbonate (RENVELA) 800 MG tablet Take 800 mg by mouth 3 (three) times daily with meals.     tamsulosin (FLOMAX) 0.4 MG CAPS capsule TAKE 1 CAPSULE BY MOUTH EVERY DAY 30 capsule 0   ticagrelor (BRILINTA) 90 MG TABS tablet Take 1 tablet (90 mg total) by mouth 2 (two) times daily. 60 tablet 0   zolpidem (AMBIEN) 5 MG tablet Take 1 tablet (5 mg total) by mouth at bedtime as needed for sleep. 30 tablet 0   No current facility-administered medications for this visit.   Facility-Administered Medications Ordered in Other Visits  Medication Dose Route Frequency Provider Last Rate Last Admin   heparin lock flush 100 unit/mL  500 Units Intracatheter Once Truitt Merle, MD       sodium chloride flush (NS) 0.9 % injection 10 mL  10 mL Intracatheter Once Truitt Merle, MD        PHYSICAL EXAMINATION: ECOG PERFORMANCE STATUS: 3 - Symptomatic, >50% confined to bed  There were no vitals filed for this visit. Wt Readings from Last 3 Encounters:  02/14/22 161 lb 8 oz (73.3 kg)  02/07/22 165 lb (74.8 kg)  01/31/22 164 lb 8 oz (74.6 kg)      GENERAL:alert, no distress and comfortable SKIN: skin color normal, no rashes or significant lesions EYES: normal, Conjunctiva are pink and non-injected, sclera clear  NEURO: alert & oriented x 3 with fluent speech  LABORATORY DATA:  I have reviewed the data as listed CBC Latest Ref Rng & Units 02/14/2022 02/07/2022 01/31/2022  WBC 4.0 - 10.5 K/uL 7.5 9.1 9.0  Hemoglobin 13.0 - 17.0 g/dL 9.1(L) 8.6(L) 8.4(L)  Hematocrit 39.0 - 52.0 % 28.3(L) 26.9(L) 26.3(L)  Platelets 150 - 400 K/uL 189 216 203     CMP Latest Ref Rng & Units 02/14/2022 02/07/2022 01/31/2022  Glucose 70 - 99 mg/dL 141(H) 131(H) 134(H)  BUN 8 - 23 mg/dL 17 18 13   Creatinine 0.61 - 1.24 mg/dL 3.67(HH) 3.67(HH) 3.24(HH)  Sodium 135 - 145 mmol/L 138 139 139  Potassium 3.5 - 5.1 mmol/L 3.9 4.0 4.1  Chloride 98 - 111 mmol/L 105 104 102  CO2 22 - 32 mmol/L 27 27 30   Calcium 8.9 - 10.3 mg/dL 8.7(L) 8.4(L) 8.7(L)  Total Protein 6.5 - 8.1 g/dL 5.7(L) 5.6(L) 5.9(L)  Total Bilirubin 0.3 - 1.2 mg/dL 0.3 0.3 0.3  Alkaline Phos 38 - 126 U/L 91 84 83  AST 15 - 41 U/L 9(L) 10(L) 8(L)  ALT 0 - 44 U/L 5 5 5       RADIOGRAPHIC STUDIES: I have personally reviewed the radiological images as listed and agreed with the findings in the report. No results found.    No orders of the defined types were placed in this encounter.  All questions were answered. The patient knows to call the clinic with any problems, questions or concerns. No barriers to learning was detected.      Truitt Merle, MD 02/14/2022   I, Wilburn Mylar, am acting as scribe for Truitt Merle, MD.   I have reviewed the above documentation for accuracy and completeness, and I agree with the above.

## 2022-02-14 NOTE — Progress Notes (Signed)
Per Dr. Burr Medico, okay to treat with BP 89/71 and HR 105.

## 2022-02-14 NOTE — Patient Instructions (Signed)
[facility]  Discharge Instructions: Thank you for choosing [facility] to provide your oncology and hematology care.   If you have a lab appointment with the [facility], please go directly to the [facility] and check in at the registration area.   Wear comfortable clothing and clothing appropriate for easy access to any Portacath or PICC line.   We strive to give you quality time with your provider. You may need to reschedule your appointment if you arrive late (15 or more minutes).  Arriving late affects you and other patients whose appointments are after yours.  Also, if you miss three or more appointments without notifying the office, you may be dismissed from the clinic at the providers discretion.      For prescription refill requests, have your pharmacy contact our office and allow 72 hours for refills to be completed.    Today you received the following chemotherapy and/or immunotherapy agents: Velcade/Darzalex Faspro.      To help prevent nausea and vomiting after your treatment, we encourage you to take your nausea medication as directed.  BELOW ARE SYMPTOMS THAT SHOULD BE REPORTED IMMEDIATELY: *FEVER GREATER THAN 100.4 F (38 C) OR HIGHER *CHILLS OR SWEATING *NAUSEA AND VOMITING THAT IS NOT CONTROLLED WITH YOUR NAUSEA MEDICATION *UNUSUAL SHORTNESS OF BREATH *UNUSUAL BRUISING OR BLEEDING *URINARY PROBLEMS (pain or burning when urinating, or frequent urination) *BOWEL PROBLEMS (unusual diarrhea, constipation, pain near the anus) TENDERNESS IN MOUTH AND THROAT WITH OR WITHOUT PRESENCE OF ULCERS (sore throat, sores in mouth, or a toothache) UNUSUAL RASH, SWELLING OR PAIN  UNUSUAL VAGINAL DISCHARGE OR ITCHING   Items with * indicate a potential emergency and should be followed up as soon as possible or go to the Emergency Department if any problems should occur.  Please show the CHEMOTHERAPY ALERT CARD or IMMUNOTHERAPY ALERT CARD  at check-in to the Emergency Department and triage nurse.  Should you have questions after your visit or need to cancel or reschedule your appointment, please contact [facility]  Dept: [phone]  and follow the prompts.  Office hours are 8:00 a.m. to 4:30 p.m. Monday - Friday. Please note that voicemails left after 4:00 p.m. may not be returned until the following business day.  We are closed weekends and major holidays. You have access to a nurse at all times for urgent questions. Please call the main number to the clinic Dept: [phone] and follow the prompts.   For any non-urgent questions, you may also contact your provider using MyChart. We now offer e-Visits for anyone 1 and older to request care online for non-urgent symptoms. For details visit [email or web address].   Also download the MyChart app! Go to the app store, search "MyChart", open the app, select [facility], and log in with your MyChart username and password.  Due to Covid, a mask is required upon entering the hospital/clinic. If you do not have a mask, one will be given to you upon arrival. For doctor visits, patients may have 1 support person aged 35 or older with them. For treatment visits, patients cannot have anyone with them due to current Covid guidelines and our immunocompromised population.

## 2022-02-15 ENCOUNTER — Inpatient Hospital Stay: Payer: Commercial Managed Care - PPO

## 2022-02-15 ENCOUNTER — Inpatient Hospital Stay: Payer: Commercial Managed Care - PPO | Admitting: Hematology

## 2022-02-19 ENCOUNTER — Telehealth: Payer: Self-pay | Admitting: Hematology

## 2022-02-19 NOTE — Telephone Encounter (Signed)
Scheduled follow-up appointment per 2/23 los. Patient's daughter is aware.

## 2022-02-21 ENCOUNTER — Inpatient Hospital Stay (HOSPITAL_BASED_OUTPATIENT_CLINIC_OR_DEPARTMENT_OTHER): Payer: Commercial Managed Care - PPO | Admitting: Nurse Practitioner

## 2022-02-21 ENCOUNTER — Encounter: Payer: Self-pay | Admitting: Nurse Practitioner

## 2022-02-21 ENCOUNTER — Other Ambulatory Visit: Payer: Self-pay

## 2022-02-21 ENCOUNTER — Other Ambulatory Visit: Payer: Self-pay | Admitting: Hematology

## 2022-02-21 ENCOUNTER — Inpatient Hospital Stay: Payer: Commercial Managed Care - PPO

## 2022-02-21 ENCOUNTER — Inpatient Hospital Stay: Payer: Commercial Managed Care - PPO | Attending: Hematology

## 2022-02-21 ENCOUNTER — Other Ambulatory Visit (HOSPITAL_COMMUNITY): Payer: Self-pay

## 2022-02-21 VITALS — BP 92/58 | HR 85 | Temp 98.1°F | Resp 18 | Wt 160.0 lb

## 2022-02-21 DIAGNOSIS — Z5112 Encounter for antineoplastic immunotherapy: Secondary | ICD-10-CM | POA: Diagnosis not present

## 2022-02-21 DIAGNOSIS — I951 Orthostatic hypotension: Secondary | ICD-10-CM | POA: Diagnosis not present

## 2022-02-21 DIAGNOSIS — E8581 Light chain (AL) amyloidosis: Secondary | ICD-10-CM | POA: Insufficient documentation

## 2022-02-21 DIAGNOSIS — K5900 Constipation, unspecified: Secondary | ICD-10-CM | POA: Diagnosis not present

## 2022-02-21 DIAGNOSIS — N186 End stage renal disease: Secondary | ICD-10-CM | POA: Insufficient documentation

## 2022-02-21 DIAGNOSIS — G4700 Insomnia, unspecified: Secondary | ICD-10-CM | POA: Insufficient documentation

## 2022-02-21 DIAGNOSIS — D631 Anemia in chronic kidney disease: Secondary | ICD-10-CM | POA: Insufficient documentation

## 2022-02-21 DIAGNOSIS — Z515 Encounter for palliative care: Secondary | ICD-10-CM | POA: Diagnosis not present

## 2022-02-21 DIAGNOSIS — C9000 Multiple myeloma not having achieved remission: Secondary | ICD-10-CM | POA: Diagnosis present

## 2022-02-21 DIAGNOSIS — Z992 Dependence on renal dialysis: Secondary | ICD-10-CM | POA: Diagnosis not present

## 2022-02-21 DIAGNOSIS — R530 Neoplastic (malignant) related fatigue: Secondary | ICD-10-CM | POA: Diagnosis not present

## 2022-02-21 DIAGNOSIS — G893 Neoplasm related pain (acute) (chronic): Secondary | ICD-10-CM

## 2022-02-21 LAB — CBC WITH DIFFERENTIAL/PLATELET
Abs Immature Granulocytes: 0.02 10*3/uL (ref 0.00–0.07)
Basophils Absolute: 0 10*3/uL (ref 0.0–0.1)
Basophils Relative: 1 %
Eosinophils Absolute: 0.2 10*3/uL (ref 0.0–0.5)
Eosinophils Relative: 3 %
HCT: 29.5 % — ABNORMAL LOW (ref 39.0–52.0)
Hemoglobin: 9.5 g/dL — ABNORMAL LOW (ref 13.0–17.0)
Immature Granulocytes: 0 %
Lymphocytes Relative: 22 %
Lymphs Abs: 1.3 10*3/uL (ref 0.7–4.0)
MCH: 30.5 pg (ref 26.0–34.0)
MCHC: 32.2 g/dL (ref 30.0–36.0)
MCV: 94.9 fL (ref 80.0–100.0)
Monocytes Absolute: 0.5 10*3/uL (ref 0.1–1.0)
Monocytes Relative: 8 %
Neutro Abs: 3.9 10*3/uL (ref 1.7–7.7)
Neutrophils Relative %: 66 %
Platelets: 170 10*3/uL (ref 150–400)
RBC: 3.11 MIL/uL — ABNORMAL LOW (ref 4.22–5.81)
RDW: 16.8 % — ABNORMAL HIGH (ref 11.5–15.5)
WBC: 5.9 10*3/uL (ref 4.0–10.5)
nRBC: 0 % (ref 0.0–0.2)

## 2022-02-21 LAB — COMPREHENSIVE METABOLIC PANEL
ALT: 6 U/L (ref 0–44)
AST: 9 U/L — ABNORMAL LOW (ref 15–41)
Albumin: 3.3 g/dL — ABNORMAL LOW (ref 3.5–5.0)
Alkaline Phosphatase: 90 U/L (ref 38–126)
Anion gap: 7 (ref 5–15)
BUN: 16 mg/dL (ref 8–23)
CO2: 30 mmol/L (ref 22–32)
Calcium: 8.5 mg/dL — ABNORMAL LOW (ref 8.9–10.3)
Chloride: 103 mmol/L (ref 98–111)
Creatinine, Ser: 3.81 mg/dL (ref 0.61–1.24)
GFR, Estimated: 16 mL/min — ABNORMAL LOW (ref >60)
Glucose, Bld: 122 mg/dL — ABNORMAL HIGH (ref 70–99)
Potassium: 3.8 mmol/L (ref 3.5–5.1)
Sodium: 140 mmol/L (ref 135–145)
Total Bilirubin: 0.3 mg/dL (ref 0.3–1.2)
Total Protein: 5.6 g/dL — ABNORMAL LOW (ref 6.5–8.1)

## 2022-02-21 MED ORDER — BORTEZOMIB CHEMO SQ INJECTION 3.5 MG (2.5MG/ML)
1.3000 mg/m2 | Freq: Once | INTRAMUSCULAR | Status: AC
  Administered 2022-02-21: 2.5 mg via SUBCUTANEOUS
  Filled 2022-02-21: qty 1

## 2022-02-21 MED ORDER — DEXAMETHASONE 4 MG PO TABS
20.0000 mg | ORAL_TABLET | Freq: Once | ORAL | Status: AC
  Administered 2022-02-21: 20 mg via ORAL
  Filled 2022-02-21: qty 5

## 2022-02-21 MED ORDER — CYCLOPHOSPHAMIDE 50 MG PO CAPS
225.0000 mg/m2 | ORAL_CAPSULE | ORAL | 1 refills | Status: DC
  Filled 2022-03-11: qty 36, 28d supply, fill #0

## 2022-02-21 NOTE — Patient Instructions (Addendum)
[facility]  Discharge Instructions: ?Thank you for choosing [facility] to provide your oncology and hematology care.  ? ?If you have a lab appointment with the [facility], please go directly to the [facility] and check in at the registration area. ?  ?Wear comfortable clothing and clothing appropriate for easy access to any Portacath or PICC line.  ? ?We strive to give you quality time with your provider. You may need to reschedule your appointment if you arrive late (15 or more minutes).  Arriving late affects you and other patients whose appointments are after yours.  Also, if you miss three or more appointments without notifying the office, you may be dismissed from the clinic at the provider?s discretion.    ?  ?For prescription refill requests, have your pharmacy contact our office and allow 72 hours for refills to be completed.   ? ?Today you received the following chemotherapy and/or immunotherapy agent: Bortezomib (Velcade) and administered home oral cytoxan ?  ?To help prevent nausea and vomiting after your treatment, we encourage you to take your nausea medication as directed. ? ?BELOW ARE SYMPTOMS THAT SHOULD BE REPORTED IMMEDIATELY: ?*FEVER GREATER THAN 100.4 F (38 ?C) OR HIGHER ?*CHILLS OR SWEATING ?*NAUSEA AND VOMITING THAT IS NOT CONTROLLED WITH YOUR NAUSEA MEDICATION ?*UNUSUAL SHORTNESS OF BREATH ?*UNUSUAL BRUISING OR BLEEDING ?*URINARY PROBLEMS (pain or burning when urinating, or frequent urination) ?*BOWEL PROBLEMS (unusual diarrhea, constipation, pain near the anus) ?TENDERNESS IN MOUTH AND THROAT WITH OR WITHOUT PRESENCE OF ULCERS (sore throat, sores in mouth, or a toothache) ?UNUSUAL RASH, SWELLING OR PAIN  ?UNUSUAL VAGINAL DISCHARGE OR ITCHING  ? ?Items with * indicate a potential emergency and should be followed up as soon as possible or go to the Emergency Department if any problems should occur. ? ?Please show the CHEMOTHERAPY ALERT CARD  or IMMUNOTHERAPY ALERT CARD at check-in to the Emergency Department and triage nurse. ? ?Should you have questions after your visit or need to cancel or reschedule your appointment, please contact [facility]  Dept: [phone]  and follow the prompts.  Office hours are 8:00 a.m. to 4:30 p.m. Monday - Friday. Please note that voicemails left after 4:00 p.m. may not be returned until the following business day.  We are closed weekends and major holidays. You have access to a nurse at all times for urgent questions. Please call the main number to the clinic Dept: [phone] and follow the prompts. ? ? ?For any non-urgent questions, you may also contact your provider using MyChart. We now offer e-Visits for anyone 62 and older to request care online for non-urgent symptoms. For details visit [email or web address]. ?  ?Also download the MyChart app! Go to the app store, search "MyChart", open the app, select [facility], and log in with your MyChart username and password. ? ?Due to Covid, a mask is required upon entering the hospital/clinic. If you do not have a mask, one will be given to you upon arrival. For doctor visits, patients may have 1 support person aged 49 or older with them. For treatment visits, patients cannot have anyone with them due to current Covid guidelines and our immunocompromised population.  ? ?

## 2022-02-21 NOTE — Progress Notes (Signed)
Pt. given oral Cytoxan per orders 9 pills for 450 mg po. Verified by Apolonio Schneiders RN ?

## 2022-02-21 NOTE — Progress Notes (Signed)
Per Dr. Burr Medico, ok to treat with creat 3.8mg /dL and BP 92/58.  Pt denies chest pain, SHOB, or dizziness.  ?

## 2022-02-21 NOTE — Progress Notes (Signed)
? ?  ?Palliative Medicine ?[facility]  ?Telephone:(336) [phone] [phone] ? ? ?Name: Caleb Simmons ?Date: 02/21/2022 ?MRN: [id number]  ?DOB: 18-Jan-1951 ? ?Patient Care Team: ?Pcp, No as PCP - General ?Buntin, Lavonda Jumbo, RN as Registered Nurse ?Pickenpack-Cousar, Carlena Sax, NP as Nurse Practitioner (Nurse Practitioner)  ? ? ?INTERVAL HISTORY: ?Caleb Simmons is a 71 y.o. 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  Recently admitted and discharged on 01/20/22 after receiving treatment for left MCA CVA. Palliative following for ongoing goals of care discussions and symptom management.  ? ?SOCIAL HISTORY:    ? reports that he has been smoking cigarettes. He has a 6.25 pack-year smoking history. He has never used smokeless tobacco. ? ?ADVANCE DIRECTIVES:  ?Patient reports completed document.  [name], Caleb Simmons. is his healthcare power of attorney. ? ?CODE STATUS: Full code ? ?PAST MEDICAL HISTORY:History reviewed. No pertinent past medical history. ? ?ALLERGIES:  has No Known Allergies. ? ?MEDICATIONS:  ?Current Outpatient Medications  ?Medication Sig Dispense Refill  ? acetaminophen (TYLENOL) 325 MG tablet Take 2 tablets (650 mg total) by mouth every 6 (six) hours as needed for moderate pain (headache).    ? ALPRAZolam (XANAX) 0.5 MG tablet TAKE 1 TABLET BY MOUTH 2 TIMES DAILY AS NEEDED FOR ANXIETY OR SLEEP 60 tablet 0  ? aspirin 81 MG EC tablet Take 1 tablet (81 mg total) by mouth daily. Swallow whole. 30 tablet 11  ? atorvastatin (LIPITOR) 40 MG tablet Take 1 tablet (40 mg total) by mouth at bedtime. 90 tablet 1  ? calcitRIOL (ROCALTROL) 0.25 MCG capsule Take 1 capsule (0.25 mcg total) by mouth every Monday, Wednesday, and Friday with hemodialysis. 30 capsule 1  ? cyclophosphamide (CYTOXAN) 50 MG capsule Take 9 capsules (450 mg total) by mouth once a week. Take with food to  minimize GI upset. Take early in the day and maintain hydration. 36 capsule 1  ? folic acid (FOLVITE) 1 MG tablet TAKE 2 TABLETS BY MOUTH EVERY DAY 60 tablet 0  ? Melatonin 10 MG TABS Take 10 mg by mouth at bedtime.    ? midodrine (PROAMATINE) 5 MG tablet Take 1 tablet (5 mg total) by mouth 3 (three) times daily with meals. 270 tablet 3  ? mirtazapine (REMERON) 30 MG tablet Take 1 tablet (30 mg total) by mouth at bedtime. 30 tablet 1  ? ondansetron (ZOFRAN) 4 MG tablet Take 1 tablet (4 mg total) by mouth every 6 (six) hours as needed for nausea or vomiting. 30 tablet 2  ? oxyCODONE (OXY IR/ROXICODONE) 5 MG immediate release tablet Take 1 tablet (5 mg total) by mouth every 6 (six) hours as needed for severe pain. 60 tablet 0  ? pantoprazole (PROTONIX) 40 MG tablet TAKE 1 TABLET BY MOUTH TWICE DAILY BEFORE A MEAL 60 tablet 0  ? prochlorperazine (COMPAZINE) 5 MG tablet Take 1 tablet (5 mg total) by mouth every 6 (six) hours as needed for nausea or vomiting. 30 tablet 2  ? sevelamer carbonate (RENVELA) 800 MG tablet Take 800 mg by mouth 3 (three) times daily with meals.    ? tamsulosin (FLOMAX) 0.4 MG CAPS capsule TAKE 1 CAPSULE BY MOUTH EVERY DAY 30 capsule 0  ? ticagrelor (BRILINTA) 90 MG TABS tablet Take 1 tablet (90 mg total) by mouth 2 (two) times daily. 60 tablet 0  ? zolpidem (AMBIEN) 5 MG tablet Take  1 tablet (5 mg total) by mouth at bedtime as needed for sleep. 30 tablet 0  ? ?No current facility-administered medications for this visit.  ? ?Facility-Administered Medications Ordered in Other Visits  ?Medication Dose Route Frequency Provider Last Rate Last Admin  ? heparin lock flush 100 unit/mL  500 Units Intracatheter Once Truitt Merle, MD      ? sodium chloride flush (NS) 0.9 % injection 10 mL  10 mL Intracatheter Once Truitt Merle, MD      ? ? ?VITAL SIGNS: ?There were no vitals taken for this visit. ?There were no vitals filed for this visit. ?  ?Estimated body mass index is 22.96 kg/m? as calculated from the  following: ?  Height as of 01/10/22: 5\' 10"  (1.778 m). ?  Weight as of an earlier encounter on 02/21/22: 160 lb (72.6 kg). ? ? ?PERFORMANCE STATUS (ECOG) : 2 - Symptomatic, <50% confined to bed ? ? ?Physical Exam ?General: NAD ?Cardiovascular: RRR ?Pulmonary: clear ant fields ?Abdomen: soft, nontender, + bowel sounds ?Extremities: no edema, no joint deformities ?Neurological: AAO x4 ? ?IMPRESSION: ? ?I saw [name] during his infusion today.  No acute distress noted.  He is feeling better compared to previous visit. Reports regaining his energy slowly.  ? ?Pain ?[name] reports pain is well controlled on current regimen (Oxy IR 5mg ). Is taking only when needed, which is occasionally throughout the day.  ?  ?Decreased Appetite ?Tolerating Remeron 30 mg. His appetite has decreased some given recent events but he feels it is improving again. He shares he does well for several days and then appetite will decrease closer to time for treatment. We discussed continuing to monitor closely. If weight continues to decline and appetite does not improve may consider medication changes/adjustments which he is agreeable to. Current weight 160lbs down from 165 on 2/9. ? ?Constipation ?Is actively taking Miralax daily for bowel regimen. Denies any concerns with constipation but can tell a difference when missing a dose.  ?  ?Insomnia ?Reports resting well. Much improved. Able to sleep throughout the night and will occasionally take a nap during the day.  ? ?PLAN: ?Pain well controlled. No changes or refills needed at this time.  ?All symptom management needs are stable with no adjustments required.  Will closely monitor appetite and make adjustments if no increase or further weight decline. Education provided on small frequent meals. Careful considerations given he is an active dialysis patient. Recommendations for Neproshakes for added nourishment.  ?I will plan to see him back in 3-4 weeks in collaboration with his other oncology  appointments. ? ? ?Patient expressed understanding and was in agreement with this plan. He also understands that He can call the clinic at any time with any questions, concerns, or complaints.  ? ?Time Total: 25 min ? ?Visit consisted of counseling and education dealing with the complex and emotionally intense issues of symptom management and palliative care in the setting of serious and potentially life-threatening illness.Greater than 50%  of this time was spent counseling and coordinating care related to the above assessment and plan. ? ?Alda Lea, AGPCNP-BC  ?[facility] ? ? ?  ?

## 2022-02-25 ENCOUNTER — Other Ambulatory Visit: Payer: Self-pay | Admitting: Nurse Practitioner

## 2022-02-25 ENCOUNTER — Telehealth (HOSPITAL_COMMUNITY): Payer: Self-pay | Admitting: Cardiology

## 2022-02-25 MED ORDER — METOPROLOL SUCCINATE ER 25 MG PO TB24: 12.5000 mg | ORAL_TABLET | Freq: Every day | ORAL | 1 refills | Status: DC

## 2022-02-25 MED ORDER — ALPRAZOLAM 0.5 MG PO TABS: ORAL_TABLET | ORAL | 0 refills | Status: DC

## 2022-02-25 MED ORDER — MIRTAZAPINE 30 MG PO TABS: 30.0000 mg | ORAL_TABLET | Freq: Every day | ORAL | 1 refills | Status: DC

## 2022-02-25 MED ORDER — ZOLPIDEM TARTRATE 5 MG PO TABS: 5.0000 mg | ORAL_TABLET | Freq: Every evening | ORAL | 1 refills | Status: DC | PRN

## 2022-02-25 NOTE — Telephone Encounter (Signed)
Patient called. Patient aware via daughter in law.  ?

## 2022-02-25 NOTE — Telephone Encounter (Signed)
-----   Message from Larey Dresser, MD sent at 02/17/2022 11:32 PM EST ----- ?He had 2 NSVT episodes, multiple SVT episodes, occasional PACs and PVCs.  Would like to start him on Toprol XL 12.5 mg daily.  If he tolerates this, can increase to Toprol XL 25 mg daily.  ?

## 2022-02-26 ENCOUNTER — Encounter (HOSPITAL_COMMUNITY): Payer: Commercial Managed Care - PPO

## 2022-02-26 ENCOUNTER — Other Ambulatory Visit (HOSPITAL_COMMUNITY): Payer: Commercial Managed Care - PPO

## 2022-02-26 ENCOUNTER — Encounter: Payer: Commercial Managed Care - PPO | Admitting: Vascular Surgery

## 2022-02-27 NOTE — Progress Notes (Signed)
[facility]   Telephone:([phone]) [phone] Fax:(336) [phone]   Clinic Follow up Note   Patient Care Team: Pcp, No as PCP - General Buntin, Lavonda Jumbo, RN as Registered Nurse Pickenpack-Cousar, Carlena Sax, NP as Nurse Practitioner (Nurse Practitioner) 02/28/2022  CHIEF COMPLAINT: Follow-up multiorgan AL amyloidosis  CURRENT THERAPY: CyBorD (weekly Dexa, Velcade and Cytoxan), started 09/10/21 -daratumumab injection q2 weeks added 09/27/21; will change to monthly after 6 months of treatment  INTERVAL HISTORY: Caleb Simmons returns for follow-up and treatment as scheduled, last seen by Dr. Burr Medico 02/14/2022.  He continues CyBorD plus daratumumab regimen. His main complaint is bilateral hip pain which worsened after hospitalization and starting Lipitor.  At worst pain is a 9/10, sometimes improves to 6/10 with oxycodone, topical cream, lidocaine patch, stretching, and heat.  Pain never ever goes away.  He is up and down at home, mobilizing.  BP is low but at baseline, denies dizziness on standing.  On dialysis Monday Wednesday Friday.  He is sleeping better overall except on treatment days when he received steroids.  He has good appetite but bad taste in his mouth so he is not eating or drinking as well. He has nausea daily but managed with Zofran, does not really use Compazine.  Bowels moving with MiraLAX every other day.  Overall Caleb Simmons feels weaker he attributes to worsening pain and treatment, but strongly wants to continue the current regimen.  States he will let us know when he has had enough.  Denies fever, chills, cough, chest pain, dyspnea, new pain, or any other specific complaints.[name]   MEDICAL HISTORY:  No past medical history on file.  SURGICAL HISTORY: Past Surgical History:  Procedure Laterality Date   APPENDECTOMY     IR ANGIO INTRA EXTRACRAN SEL COM CAROTID INNOMINATE BILAT MOD SED  01/16/2022   IR ANGIO VERTEBRAL SEL SUBCLAVIAN INNOMINATE BILAT MOD SED  01/16/2022   IR CT HEAD  LTD  01/18/2022   IR EMBO ART  VEN HEMORR LYMPH EXTRAV  INC GUIDE ROADMAPPING  08/30/2021   IR FLUORO GUIDE CV LINE RIGHT  08/30/2021   IR FLUORO GUIDE CV LINE RIGHT  09/18/2021   IR PERCUTANEOUS ART THROMBECTOMY/INFUSION INTRACRANIAL INC DIAG ANGIO  01/18/2022   IR RENAL SELECTIVE  UNI INC S&I MOD SED  08/31/2021   IR US GUIDE VASC ACCESS RIGHT  08/30/2021   IR US GUIDE VASC ACCESS RIGHT  08/30/2021   IR US GUIDE VASC ACCESS RIGHT  09/18/2021   IR US GUIDE VASC ACCESS RIGHT  01/16/2022   RADIOLOGY WITH ANESTHESIA N/A 01/18/2022   Procedure: Cerebral angioplasty with possible stenting;  Surgeon: Luanne Bras, MD;  Location: [facility];  Service: Radiology;  Laterality: N/A;    I have reviewed the social history and family history with the patient and they are unchanged from previous note.  ALLERGIES:  has No Known Allergies.  MEDICATIONS:  Current Outpatient Medications  Medication Sig Dispense Refill   acetaminophen (TYLENOL) 325 MG tablet Take 2 tablets (650 mg total) by mouth every 6 (six) hours as needed for moderate pain (headache).     ALPRAZolam (XANAX) 0.5 MG tablet TAKE 1 TABLET BY MOUTH 2 TIMES DAILY AS NEEDED FOR ANXIETY OR SLEEP 60 tablet 0   aspirin 81 MG EC tablet Take 1 tablet (81 mg total) by mouth daily. Swallow whole. 30 tablet 11   atorvastatin (LIPITOR) 40 MG tablet Take 1 tablet (40 mg total) by mouth at bedtime. 90 tablet 1   calcitRIOL (  ROCALTROL) 0.25 MCG capsule Take 1 capsule (0.25 mcg total) by mouth every Monday, Wednesday, and Friday with hemodialysis. 30 capsule 1   cyclophosphamide (CYTOXAN) 50 MG capsule Take 9 capsules (450 mg total) by mouth once a week. Take with food to minimize GI upset. Take early in the day and maintain hydration. 36 capsule 1   folic acid (FOLVITE) 1 MG tablet TAKE 2 TABLETS BY MOUTH EVERY DAY 60 tablet 0   Melatonin 10 MG TABS Take 10 mg by mouth at bedtime.     metoprolol succinate (TOPROL XL) 25 MG 24 hr tablet Take 0.5 tablets (12.5 mg total)  by mouth daily. 15 tablet 1   midodrine (PROAMATINE) 5 MG tablet Take 1 tablet (5 mg total) by mouth 3 (three) times daily with meals. 270 tablet 3   mirtazapine (REMERON) 30 MG tablet Take 1 tablet (30 mg total) by mouth at bedtime. 30 tablet 1   ondansetron (ZOFRAN) 4 MG tablet Take 1 tablet (4 mg total) by mouth every 8 (eight) hours as needed for nausea or vomiting. 45 tablet 2   oxyCODONE (OXY IR/ROXICODONE) 5 MG immediate release tablet Take 1 tablet (5 mg total) by mouth every 6 (six) hours as needed for severe pain. 60 tablet 0   pantoprazole (PROTONIX) 40 MG tablet TAKE 1 TABLET BY MOUTH TWICE DAILY BEFORE A MEAL 60 tablet 0   prochlorperazine (COMPAZINE) 5 MG tablet Take 1 tablet (5 mg total) by mouth every 6 (six) hours as needed for nausea or vomiting. 30 tablet 2   sevelamer carbonate (RENVELA) 800 MG tablet Take 800 mg by mouth 3 (three) times daily with meals.     tamsulosin (FLOMAX) 0.4 MG CAPS capsule TAKE 1 CAPSULE BY MOUTH EVERY DAY 30 capsule 0   ticagrelor (BRILINTA) 90 MG TABS tablet Take 1 tablet (90 mg total) by mouth 2 (two) times daily. 60 tablet 0   zolpidem (AMBIEN) 5 MG tablet Take 1 tablet (5 mg total) by mouth at bedtime as needed for sleep. 30 tablet 1   No current facility-administered medications for this visit.   Facility-Administered Medications Ordered in Other Visits  Medication Dose Route Frequency Provider Last Rate Last Admin   bortezomib SQ (VELCADE) chemo injection (2.7m/mL concentration) 2.5 mg  1.3 mg/m2 (Treatment Plan Recorded) Subcutaneous Once FTruitt Merle MD       daratumumab-hyaluronidase-fihj ([facility]PRO) 1800-30000 MG-UT/15ML chemo SQ injection 1,800 mg  1,800 mg Subcutaneous Once FTruitt Merle MD       heparin lock flush 100 unit/mL  500 Units Intracatheter Once FTruitt Merle MD       sodium chloride flush (NS) 0.9 % injection 10 mL  10 mL Intracatheter Once FTruitt Merle MD        PHYSICAL EXAMINATION: ECOG PERFORMANCE STATUS: 2 - Symptomatic,  <50% confined to bed  Vitals:   02/28/22 0902  BP: (!) 86/52  Pulse: 89  Resp: 18  Temp: (!) 97.4 F (36.3 C)  SpO2: 99%   Filed Weights   02/28/22 0902  Weight: 162 lb 9.6 oz (73.8 kg)    GENERAL:alert, no distress and comfortable SKIN: dry, no rash  EYES: sclera clear LUNGS: clear with normal breathing effort HEART: regular rate & rhythm, no lower extremity edema ABDOMEN:abdomen soft, non-tender and normal bowel sounds Musculoskeletal: nonfocal NEURO: alert & oriented x 3 with fluent speech Dialysis catheter to R chest, covered with gauze dressing  LABORATORY DATA:  I have reviewed the data as listed CBC Latest Ref  Rng & Units 02/28/2022 02/21/2022 02/14/2022  WBC 4.0 - 10.5 K/uL 5.0 5.9 7.5  Hemoglobin 13.0 - 17.0 g/dL 9.4(L) 9.5(L) 9.1(L)  Hematocrit 39.0 - 52.0 % 29.6(L) 29.5(L) 28.3(L)  Platelets 150 - 400 K/uL 134(L) 170 189     CMP Latest Ref Rng & Units 02/28/2022 02/21/2022 02/14/2022  Glucose 70 - 99 mg/dL 126(H) 122(H) 141(H)  BUN 8 - 23 mg/dL 14 16 17   Creatinine 0.61 - 1.24 mg/dL 3.17(HH) 3.81(HH) 3.67(HH)  Sodium 135 - 145 mmol/L 139 140 138  Potassium 3.5 - 5.1 mmol/L 4.0 3.8 3.9  Chloride 98 - 111 mmol/L 104 103 105  CO2 22 - 32 mmol/L 30 30 27   Calcium 8.9 - 10.3 mg/dL 8.7(L) 8.5(L) 8.7(L)  Total Protein 6.5 - 8.1 g/dL 5.4(L) 5.6(L) 5.7(L)  Total Bilirubin 0.3 - 1.2 mg/dL 0.4 0.3 0.3  Alkaline Phos 38 - 126 U/L 90 90 91  AST 15 - 41 U/L 9(L) 9(L) 9(L)  ALT 0 - 44 U/L 6 6 5       RADIOGRAPHIC STUDIES: I have personally reviewed the radiological images as listed and agreed with the findings in the report. No results found.   ASSESSMENT & PLAN: Caleb Simmons is a 71 y.o. male with    1. AL amyloidosis with renal, cardiac and neuro involvement  -initially presented with weakness and SOB; lab work up showed BUN of 44, Cr 6.5, significant proteinuria/RBC on UA. Right renal biopsy on 08/28/21 revealing AL amyloidosis, lambda light chain composition. 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 Both serum and urine lamda Free light chains significantly elevated. -Due to the multiorgan involvement, with significant CHF (EF 45-50%) and renal failure, his prognosis is very poor. -He began weekly oral Cytoxan, dexa and Velcade injection while inpatient on 09/10/21.  He is tolerating well so far, plan to continue for 6 months -He began daratumumab injection 09/27/21. -his light chain levels showed improvement after starting treatment, now overall stable. -not a candidate for biphosphonate due to renal failure -he continues to tolerate treatment moderately well overall. -He resumed chemo after recovering from stroke -continue weekly CyBorD, he is on Dara every 2 weeks now, plan to change to single agent Dara maintenance therapy (every 4 weeks) in 1 month (after he completes 6 months CyBorD)   2. Symptom management: Nausea, Insomnia, Low appetite, constipation, anxiety -zofran helps his nausea -he was prescribed remeron and ambien on 10/18/21 by NP Lexine Baton. They do not feel the remeron is helping. She increased his dose to 30 mg on 12/27/21 -dexa decreased from 68m to 237mweekly due to insomnia  -he is sleeping better with Ambien, Xanax, and Remeron -He has moderate nausea on Zofran, I recommend to add Compazine and alternate   3. Orthostatic Hypotension and CHF  -Likely secondary to amyloidosis with neuro involvement  -He has significant symptomatic orthostasis, SBP drops to 70's when he stands up -he met with Dr. McAundra Dubinn 10/23/21 and was started on midodrine. -BP 86/52 today, at baseline while sitting. Asymptomatic.    4. 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  Also on folic acid -stable   5. ESRD on HD, secondary to #1 -he is on dialysis Mon, Wed, Fri. -Dr FeBurr Medicoreviously spoke with his  nephrologist to see if he can do less dialysis since his amyloidosis has improved. At this time, it's not recommended,  but maybe at a later time. -creatinine has been stable in 3-range.   6. 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 MRI showed numerous infarctions scattered within left MCA. 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 He did not require stent. -He took Brilinta x1 month after hospitalization and has stopped, he continues baby aspirin and Lipitor -Due to worsening hip pain since starting Lipitor I recommend to decrease to Monday Wednesday Friday    Disposition:  Caleb Simmons appears frail but stable.  He continues weekly CyBorD (oral cytoxan, velcade inj, and oral dexamethasone) and q2 week daratumumab. He is tolerating treatment fairly well, with nausea and altered taste. I encouraged him to add compazine and alternate with zofran. We reviewed nutrition/diet and symptom management. He is able to recover and function mostly well at home. There is no strong clinical concern for disease progression.    His biggest issue is bilateral hip pain, worsened after starting statin. I recommend to reduce to MWF. Continue oxycodone q6, add tylenol in the interim, and continue other non-pharm supportive care. I will as palliative care NP Nikki to touch base.   Labs reviewed, CBC and CMP are stable. Last MM panel showed stable kappa light chain, improved lambda light chain, and stable M protein. Today's panel is pending.   We discussed his condition and goals of care, he strongly desires to continue current treatment and remains able to tolerate it.  We will continue.  Proceed with weekly CyBorD and daratumumab today as planned.  He will return next week for lab and CyBorD only.  Follow-up in 2 weeks with CyBorD and Dara.  The plan is to change to maintenance daratumumab only starting  in April.  The plan was reviewed with Dr. Burr Medico.    All questions were answered. The patient knows to call the clinic with any problems, questions or concerns. No barriers to learning were detected. I spent 20 minutes counseling the patient face to face. The total time spent in the appointment was 30 minutes and more than 50% was on counseling and review of test results     Alla Feeling, NP 02/28/22

## 2022-02-28 ENCOUNTER — Inpatient Hospital Stay: Payer: Commercial Managed Care - PPO

## 2022-02-28 ENCOUNTER — Encounter: Payer: Self-pay | Admitting: Nurse Practitioner

## 2022-02-28 ENCOUNTER — Encounter: Payer: Self-pay | Admitting: Hematology

## 2022-02-28 ENCOUNTER — Other Ambulatory Visit: Payer: Self-pay

## 2022-02-28 ENCOUNTER — Inpatient Hospital Stay (HOSPITAL_BASED_OUTPATIENT_CLINIC_OR_DEPARTMENT_OTHER): Payer: Commercial Managed Care - PPO | Admitting: Nurse Practitioner

## 2022-02-28 VITALS — BP 92/58

## 2022-02-28 VITALS — BP 86/52 | HR 89 | Temp 97.4°F | Resp 18 | Ht 70.0 in | Wt 162.6 lb

## 2022-02-28 DIAGNOSIS — Z5112 Encounter for antineoplastic immunotherapy: Secondary | ICD-10-CM | POA: Diagnosis not present

## 2022-02-28 DIAGNOSIS — E8581 Light chain (AL) amyloidosis: Secondary | ICD-10-CM

## 2022-02-28 LAB — COMPREHENSIVE METABOLIC PANEL
ALT: 6 U/L (ref 0–44)
AST: 9 U/L — ABNORMAL LOW (ref 15–41)
Albumin: 3.4 g/dL — ABNORMAL LOW (ref 3.5–5.0)
Alkaline Phosphatase: 90 U/L (ref 38–126)
Anion gap: 5 (ref 5–15)
BUN: 14 mg/dL (ref 8–23)
CO2: 30 mmol/L (ref 22–32)
Calcium: 8.7 mg/dL — ABNORMAL LOW (ref 8.9–10.3)
Chloride: 104 mmol/L (ref 98–111)
Creatinine, Ser: 3.17 mg/dL (ref 0.61–1.24)
GFR, Estimated: 20 mL/min — ABNORMAL LOW (ref >60)
Glucose, Bld: 126 mg/dL — ABNORMAL HIGH (ref 70–99)
Potassium: 4 mmol/L (ref 3.5–5.1)
Sodium: 139 mmol/L (ref 135–145)
Total Bilirubin: 0.4 mg/dL (ref 0.3–1.2)
Total Protein: 5.4 g/dL — ABNORMAL LOW (ref 6.5–8.1)

## 2022-02-28 LAB — CBC WITH DIFFERENTIAL/PLATELET
Abs Immature Granulocytes: 0.01 10*3/uL (ref 0.00–0.07)
Basophils Absolute: 0.1 10*3/uL (ref 0.0–0.1)
Basophils Relative: 1 %
Eosinophils Absolute: 0.2 10*3/uL (ref 0.0–0.5)
Eosinophils Relative: 3 %
HCT: 29.6 % — ABNORMAL LOW (ref 39.0–52.0)
Hemoglobin: 9.4 g/dL — ABNORMAL LOW (ref 13.0–17.0)
Immature Granulocytes: 0 %
Lymphocytes Relative: 22 %
Lymphs Abs: 1.1 10*3/uL (ref 0.7–4.0)
MCH: 30.1 pg (ref 26.0–34.0)
MCHC: 31.8 g/dL (ref 30.0–36.0)
MCV: 94.9 fL (ref 80.0–100.0)
Monocytes Absolute: 0.4 10*3/uL (ref 0.1–1.0)
Monocytes Relative: 8 %
Neutro Abs: 3.3 10*3/uL (ref 1.7–7.7)
Neutrophils Relative %: 66 %
Platelets: 134 10*3/uL — ABNORMAL LOW (ref 150–400)
RBC: 3.12 MIL/uL — ABNORMAL LOW (ref 4.22–5.81)
RDW: 16.8 % — ABNORMAL HIGH (ref 11.5–15.5)
WBC: 5 10*3/uL (ref 4.0–10.5)
nRBC: 0 % (ref 0.0–0.2)

## 2022-02-28 MED ORDER — DARATUMUMAB-HYALURONIDASE-FIHJ 1800-30000 MG-UT/15ML ~~LOC~~ SOLN
1800.0000 mg | Freq: Once | SUBCUTANEOUS | Status: AC
  Administered 2022-02-28: 11:00:00 1800 mg via SUBCUTANEOUS
  Filled 2022-02-28: qty 15

## 2022-02-28 MED ORDER — ACETAMINOPHEN 325 MG PO TABS
650.0000 mg | ORAL_TABLET | Freq: Once | ORAL | Status: AC
  Administered 2022-02-28: 10:00:00 650 mg via ORAL
  Filled 2022-02-28: qty 2

## 2022-02-28 MED ORDER — DIPHENHYDRAMINE HCL 25 MG PO CAPS
50.0000 mg | ORAL_CAPSULE | Freq: Once | ORAL | Status: AC
  Administered 2022-02-28: 10:00:00 50 mg via ORAL
  Filled 2022-02-28: qty 2

## 2022-02-28 MED ORDER — DEXAMETHASONE 4 MG PO TABS
20.0000 mg | ORAL_TABLET | Freq: Once | ORAL | Status: AC
  Administered 2022-02-28: 10:00:00 20 mg via ORAL
  Filled 2022-02-28: qty 5

## 2022-02-28 MED ORDER — ONDANSETRON HCL 4 MG PO TABS: 4.0000 mg | ORAL_TABLET | Freq: Three times a day (TID) | ORAL | 2 refills | Status: DC | PRN

## 2022-02-28 MED ORDER — BORTEZOMIB CHEMO SQ INJECTION 3.5 MG (2.5MG/ML)
1.3000 mg/m2 | Freq: Once | INTRAMUSCULAR | Status: AC
  Administered 2022-02-28: 11:00:00 2.5 mg via SUBCUTANEOUS
  Filled 2022-02-28: qty 1

## 2022-02-28 NOTE — Progress Notes (Signed)
Per Cira Rue NP, ok for treatment today with low blood pressure, pt. denies complaints of chest pain, dizziness, and no shortness of breath noted and serum creatine 3.17 mg/dL. ?Pt. given oral Cytoxan 450 mg, 9 pills per order. Verified by Lanette Hampshire. ?

## 2022-02-28 NOTE — Patient Instructions (Signed)
[facility]  Discharge Instructions: ?Thank you for choosing [facility] to provide your oncology and hematology care.  ? ?If you have a lab appointment with the [facility], please go directly to the [facility] and check in at the registration area. ?  ?Wear comfortable clothing and clothing appropriate for easy access to any Portacath or PICC line.  ? ?We strive to give you quality time with your provider. You may need to reschedule your appointment if you arrive late (15 or more minutes).  Arriving late affects you and other patients whose appointments are after yours.  Also, if you miss three or more appointments without notifying the office, you may be dismissed from the clinic at the provider?s discretion.    ?  ?For prescription refill requests, have your pharmacy contact our office and allow 72 hours for refills to be completed.   ? ?Today you received the following chemotherapy and/or immunotherapy agents: Bortezomib (Velcade) and Daratumumab (Darzalex Faspro). ?  ?To help prevent nausea and vomiting after your treatment, we encourage you to take your nausea medication as directed. ? ?BELOW ARE SYMPTOMS THAT SHOULD BE REPORTED IMMEDIATELY: ?*FEVER GREATER THAN 100.4 F (38 ?C) OR HIGHER ?*CHILLS OR SWEATING ?*NAUSEA AND VOMITING THAT IS NOT CONTROLLED WITH YOUR NAUSEA MEDICATION ?*UNUSUAL SHORTNESS OF BREATH ?*UNUSUAL BRUISING OR BLEEDING ?*URINARY PROBLEMS (pain or burning when urinating, or frequent urination) ?*BOWEL PROBLEMS (unusual diarrhea, constipation, pain near the anus) ?TENDERNESS IN MOUTH AND THROAT WITH OR WITHOUT PRESENCE OF ULCERS (sore throat, sores in mouth, or a toothache) ?UNUSUAL RASH, SWELLING OR PAIN  ?UNUSUAL VAGINAL DISCHARGE OR ITCHING  ? ?Items with * indicate a potential emergency and should be followed up as soon as possible or go to the Emergency Department if any problems should occur. ? ?Please show the CHEMOTHERAPY ALERT CARD  or IMMUNOTHERAPY ALERT CARD at check-in to the Emergency Department and triage nurse. ? ?Should you have questions after your visit or need to cancel or reschedule your appointment, please contact [facility]  Dept: [phone]  and follow the prompts.  Office hours are 8:00 a.m. to 4:30 p.m. Monday - Friday. Please note that voicemails left after 4:00 p.m. may not be returned until the following business day.  We are closed weekends and major holidays. You have access to a nurse at all times for urgent questions. Please call the main number to the clinic Dept: [phone] and follow the prompts. ? ? ?For any non-urgent questions, you may also contact your provider using MyChart. We now offer e-Visits for anyone 29 and older to request care online for non-urgent symptoms. For details visit [email or web address]. ?  ?Also download the MyChart app! Go to the app store, search "MyChart", open the app, select [facility], and log in with your MyChart username and password. ? ?Due to Covid, a mask is required upon entering the hospital/clinic. If you do not have a mask, one will be given to you upon arrival. For doctor visits, patients may have 1 support person aged 36 or older with them. For treatment visits, patients cannot have anyone with them due to current Covid guidelines and our immunocompromised population.  ? ?

## 2022-03-01 LAB — KAPPA/LAMBDA LIGHT CHAINS
Kappa free light chain: 39.3 mg/L — ABNORMAL HIGH (ref 3.3–19.4)
Kappa, lambda light chain ratio: 1.82 — ABNORMAL HIGH (ref 0.26–1.65)
Lambda free light chains: 21.6 mg/L (ref 5.7–26.3)

## 2022-03-04 LAB — MULTIPLE MYELOMA PANEL, SERUM
Albumin SerPl Elph-Mcnc: 3.1 g/dL (ref 2.9–4.4)
Albumin/Glob SerPl: 1.5 (ref 0.7–1.7)
Alpha 1: 0.3 g/dL (ref 0.0–0.4)
Alpha2 Glob SerPl Elph-Mcnc: 0.9 g/dL (ref 0.4–1.0)
B-Globulin SerPl Elph-Mcnc: 0.7 g/dL (ref 0.7–1.3)
Gamma Glob SerPl Elph-Mcnc: 0.3 g/dL — ABNORMAL LOW (ref 0.4–1.8)
Globulin, Total: 2.1 g/dL — ABNORMAL LOW (ref 2.2–3.9)
IgA: 32 mg/dL — ABNORMAL LOW (ref 61–437)
IgG (Immunoglobin G), Serum: 290 mg/dL — ABNORMAL LOW (ref 603–1613)
IgM (Immunoglobulin M), Srm: 61 mg/dL (ref 15–143)
M Protein SerPl Elph-Mcnc: 0.1 g/dL — ABNORMAL HIGH
Total Protein ELP: 5.2 g/dL — ABNORMAL LOW (ref 6.0–8.5)

## 2022-03-07 ENCOUNTER — Other Ambulatory Visit: Payer: Self-pay

## 2022-03-07 ENCOUNTER — Other Ambulatory Visit: Payer: Self-pay | Admitting: Hematology

## 2022-03-07 ENCOUNTER — Inpatient Hospital Stay: Payer: Commercial Managed Care - PPO

## 2022-03-07 ENCOUNTER — Telehealth: Payer: Self-pay | Admitting: *Deleted

## 2022-03-07 VITALS — BP 100/52 | HR 92 | Temp 97.9°F | Resp 18 | Ht 70.0 in | Wt 163.2 lb

## 2022-03-07 DIAGNOSIS — Z5112 Encounter for antineoplastic immunotherapy: Secondary | ICD-10-CM | POA: Diagnosis not present

## 2022-03-07 LAB — CBC WITH DIFFERENTIAL/PLATELET
Abs Immature Granulocytes: 0.03 10*3/uL (ref 0.00–0.07)
Basophils Absolute: 0.1 10*3/uL (ref 0.0–0.1)
Basophils Relative: 1 %
Eosinophils Absolute: 0.2 10*3/uL (ref 0.0–0.5)
Eosinophils Relative: 3 %
HCT: 29.5 % — ABNORMAL LOW (ref 39.0–52.0)
Hemoglobin: 9.8 g/dL — ABNORMAL LOW (ref 13.0–17.0)
Immature Granulocytes: 1 %
Lymphocytes Relative: 19 %
Lymphs Abs: 1.1 10*3/uL (ref 0.7–4.0)
MCH: 31.2 pg (ref 26.0–34.0)
MCHC: 33.2 g/dL (ref 30.0–36.0)
MCV: 93.9 fL (ref 80.0–100.0)
Monocytes Absolute: 0.6 10*3/uL (ref 0.1–1.0)
Monocytes Relative: 10 %
Neutro Abs: 3.8 10*3/uL (ref 1.7–7.7)
Neutrophils Relative %: 66 %
Platelets: 123 10*3/uL — ABNORMAL LOW (ref 150–400)
RBC: 3.14 MIL/uL — ABNORMAL LOW (ref 4.22–5.81)
RDW: 17 % — ABNORMAL HIGH (ref 11.5–15.5)
WBC: 5.7 10*3/uL (ref 4.0–10.5)
nRBC: 0 % (ref 0.0–0.2)

## 2022-03-07 LAB — COMPREHENSIVE METABOLIC PANEL
ALT: 7 U/L (ref 0–44)
AST: 11 U/L — ABNORMAL LOW (ref 15–41)
Albumin: 3.3 g/dL — ABNORMAL LOW (ref 3.5–5.0)
Alkaline Phosphatase: 85 U/L (ref 38–126)
Anion gap: 7 (ref 5–15)
BUN: 16 mg/dL (ref 8–23)
CO2: 28 mmol/L (ref 22–32)
Calcium: 8.6 mg/dL — ABNORMAL LOW (ref 8.9–10.3)
Chloride: 103 mmol/L (ref 98–111)
Creatinine, Ser: 3.22 mg/dL (ref 0.61–1.24)
GFR, Estimated: 20 mL/min — ABNORMAL LOW (ref >60)
Glucose, Bld: 126 mg/dL — ABNORMAL HIGH (ref 70–99)
Potassium: 4 mmol/L (ref 3.5–5.1)
Sodium: 138 mmol/L (ref 135–145)
Total Bilirubin: 0.3 mg/dL (ref 0.3–1.2)
Total Protein: 5.6 g/dL — ABNORMAL LOW (ref 6.5–8.1)

## 2022-03-07 MED ORDER — DEXAMETHASONE 4 MG PO TABS
20.0000 mg | ORAL_TABLET | Freq: Once | ORAL | Status: AC
  Administered 2022-03-07: 20 mg via ORAL
  Filled 2022-03-07: qty 5

## 2022-03-07 MED ORDER — OXYCODONE HCL 5 MG PO TABS
5.0000 mg | ORAL_TABLET | Freq: Four times a day (QID) | ORAL | 0 refills | Status: DC | PRN
Start: 2022-03-07 — End: 2022-03-28

## 2022-03-07 MED ORDER — BORTEZOMIB CHEMO SQ INJECTION 3.5 MG (2.5MG/ML)
1.3000 mg/m2 | Freq: Once | INTRAMUSCULAR | Status: AC
  Administered 2022-03-07: 2.5 mg via SUBCUTANEOUS
  Filled 2022-03-07: qty 1

## 2022-03-07 NOTE — Patient Instructions (Signed)
[facility]  Discharge Instructions: ?Thank you for choosing [facility] to provide your oncology and hematology care.  ? ?If you have a lab appointment with the [facility], please go directly to the [facility] and check in at the registration area. ?  ?Wear comfortable clothing and clothing appropriate for easy access to any Portacath or PICC line.  ? ?We strive to give you quality time with your provider. You may need to reschedule your appointment if you arrive late (15 or more minutes).  Arriving late affects you and other patients whose appointments are after yours.  Also, if you miss three or more appointments without notifying the office, you may be dismissed from the clinic at the provider?s discretion.    ?  ?For prescription refill requests, have your pharmacy contact our office and allow 72 hours for refills to be completed.   ? ?Today you received the following chemotherapy and/or immunotherapy agents velcade    ?  ?To help prevent nausea and vomiting after your treatment, we encourage you to take your nausea medication as directed. ? ?BELOW ARE SYMPTOMS THAT SHOULD BE REPORTED IMMEDIATELY: ?*FEVER GREATER THAN 100.4 F (38 ?C) OR HIGHER ?*CHILLS OR SWEATING ?*NAUSEA AND VOMITING THAT IS NOT CONTROLLED WITH YOUR NAUSEA MEDICATION ?*UNUSUAL SHORTNESS OF BREATH ?*UNUSUAL BRUISING OR BLEEDING ?*URINARY PROBLEMS (pain or burning when urinating, or frequent urination) ?*BOWEL PROBLEMS (unusual diarrhea, constipation, pain near the anus) ?TENDERNESS IN MOUTH AND THROAT WITH OR WITHOUT PRESENCE OF ULCERS (sore throat, sores in mouth, or a toothache) ?UNUSUAL RASH, SWELLING OR PAIN  ?UNUSUAL VAGINAL DISCHARGE OR ITCHING  ? ?Items with * indicate a potential emergency and should be followed up as soon as possible or go to the Emergency Department if any problems should occur. ? ?Please show the CHEMOTHERAPY ALERT CARD or IMMUNOTHERAPY ALERT CARD at check-in to the  Emergency Department and triage nurse. ? ?Should you have questions after your visit or need to cancel or reschedule your appointment, please contact [facility]  Dept: [phone]  and follow the prompts.  Office hours are 8:00 a.m. to 4:30 p.m. Monday - Friday. Please note that voicemails left after 4:00 p.m. may not be returned until the following business day.  We are closed weekends and major holidays. You have access to a nurse at all times for urgent questions. Please call the main number to the clinic Dept: [phone] and follow the prompts. ? ? ?For any non-urgent questions, you may also contact your provider using MyChart. We now offer e-Visits for anyone 21 and older to request care online for non-urgent symptoms. For details visit [email or web address]. ?  ?Also download the MyChart app! Go to the app store, search "MyChart", open the app, select [facility], and log in with your MyChart username and password. ? ?Due to Covid, a mask is required upon entering the hospital/clinic. If you do not have a mask, one will be given to you upon arrival. For doctor visits, patients may have 1 support person aged 73 or older with them. For treatment visits, patients cannot have anyone with them due to current Covid guidelines and our immunocompromised population.  ? ?

## 2022-03-07 NOTE — Telephone Encounter (Signed)
Received call from [facility] in lab re: Critical Crea level  3.22.  Results printed and left on Dr. Ernestina Penna desk for review. ? ?

## 2022-03-11 ENCOUNTER — Other Ambulatory Visit (HOSPITAL_COMMUNITY): Payer: Self-pay

## 2022-03-14 ENCOUNTER — Other Ambulatory Visit: Payer: Self-pay

## 2022-03-14 ENCOUNTER — Encounter: Payer: Self-pay | Admitting: Nurse Practitioner

## 2022-03-14 ENCOUNTER — Inpatient Hospital Stay: Payer: Commercial Managed Care - PPO

## 2022-03-14 ENCOUNTER — Inpatient Hospital Stay (HOSPITAL_BASED_OUTPATIENT_CLINIC_OR_DEPARTMENT_OTHER): Payer: Commercial Managed Care - PPO | Admitting: Nurse Practitioner

## 2022-03-14 ENCOUNTER — Other Ambulatory Visit (HOSPITAL_COMMUNITY): Payer: Self-pay

## 2022-03-14 VITALS — BP 104/76 | HR 84 | Temp 98.3°F | Resp 16 | Wt 164.9 lb

## 2022-03-14 DIAGNOSIS — K5900 Constipation, unspecified: Secondary | ICD-10-CM

## 2022-03-14 DIAGNOSIS — E8581 Light chain (AL) amyloidosis: Secondary | ICD-10-CM

## 2022-03-14 DIAGNOSIS — Z515 Encounter for palliative care: Secondary | ICD-10-CM

## 2022-03-14 DIAGNOSIS — R630 Anorexia: Secondary | ICD-10-CM | POA: Diagnosis not present

## 2022-03-14 DIAGNOSIS — Z5112 Encounter for antineoplastic immunotherapy: Secondary | ICD-10-CM | POA: Diagnosis not present

## 2022-03-14 LAB — CBC WITH DIFFERENTIAL/PLATELET
Abs Immature Granulocytes: 0.03 10*3/uL (ref 0.00–0.07)
Basophils Absolute: 0.1 10*3/uL (ref 0.0–0.1)
Basophils Relative: 1 %
Eosinophils Absolute: 0.2 10*3/uL (ref 0.0–0.5)
Eosinophils Relative: 4 %
HCT: 29.2 % — ABNORMAL LOW (ref 39.0–52.0)
Hemoglobin: 9.8 g/dL — ABNORMAL LOW (ref 13.0–17.0)
Immature Granulocytes: 1 %
Lymphocytes Relative: 17 %
Lymphs Abs: 0.8 10*3/uL (ref 0.7–4.0)
MCH: 31.8 pg (ref 26.0–34.0)
MCHC: 33.6 g/dL (ref 30.0–36.0)
MCV: 94.8 fL (ref 80.0–100.0)
Monocytes Absolute: 0.4 10*3/uL (ref 0.1–1.0)
Monocytes Relative: 9 %
Neutro Abs: 3.2 10*3/uL (ref 1.7–7.7)
Neutrophils Relative %: 68 %
Platelets: 148 10*3/uL — ABNORMAL LOW (ref 150–400)
RBC: 3.08 MIL/uL — ABNORMAL LOW (ref 4.22–5.81)
RDW: 16.8 % — ABNORMAL HIGH (ref 11.5–15.5)
WBC: 4.8 10*3/uL (ref 4.0–10.5)
nRBC: 0 % (ref 0.0–0.2)

## 2022-03-14 LAB — COMPREHENSIVE METABOLIC PANEL
ALT: 7 U/L (ref 0–44)
AST: 11 U/L — ABNORMAL LOW (ref 15–41)
Albumin: 3.3 g/dL — ABNORMAL LOW (ref 3.5–5.0)
Alkaline Phosphatase: 90 U/L (ref 38–126)
Anion gap: 7 (ref 5–15)
BUN: 20 mg/dL (ref 8–23)
CO2: 26 mmol/L (ref 22–32)
Calcium: 8.6 mg/dL — ABNORMAL LOW (ref 8.9–10.3)
Chloride: 104 mmol/L (ref 98–111)
Creatinine, Ser: 3.65 mg/dL (ref 0.61–1.24)
GFR, Estimated: 17 mL/min — ABNORMAL LOW (ref >60)
Glucose, Bld: 100 mg/dL — ABNORMAL HIGH (ref 70–99)
Potassium: 4.2 mmol/L (ref 3.5–5.1)
Sodium: 137 mmol/L (ref 135–145)
Total Bilirubin: 0.3 mg/dL (ref 0.3–1.2)
Total Protein: 5.3 g/dL — ABNORMAL LOW (ref 6.5–8.1)

## 2022-03-14 MED ORDER — SENNOSIDES-DOCUSATE SODIUM 8.6-50 MG PO TABS: 1.0000 | ORAL_TABLET | Freq: Every day | ORAL | 1 refills | Status: DC

## 2022-03-14 MED ORDER — DEXAMETHASONE 4 MG PO TABS
20.0000 mg | ORAL_TABLET | Freq: Once | ORAL | Status: AC
  Administered 2022-03-14: 20 mg via ORAL
  Filled 2022-03-14: qty 5

## 2022-03-14 MED ORDER — DARATUMUMAB-HYALURONIDASE-FIHJ 1800-30000 MG-UT/15ML ~~LOC~~ SOLN
1800.0000 mg | Freq: Once | SUBCUTANEOUS | Status: AC
  Administered 2022-03-14: 1800 mg via SUBCUTANEOUS
  Filled 2022-03-14: qty 15

## 2022-03-14 MED ORDER — DIPHENHYDRAMINE HCL 25 MG PO CAPS
50.0000 mg | ORAL_CAPSULE | Freq: Once | ORAL | Status: AC
  Administered 2022-03-14: 50 mg via ORAL
  Filled 2022-03-14: qty 2

## 2022-03-14 MED ORDER — ACETAMINOPHEN 325 MG PO TABS
650.0000 mg | ORAL_TABLET | Freq: Once | ORAL | Status: AC
  Administered 2022-03-14: 650 mg via ORAL
  Filled 2022-03-14: qty 2

## 2022-03-14 MED ORDER — BORTEZOMIB CHEMO SQ INJECTION 3.5 MG (2.5MG/ML)
1.3000 mg/m2 | Freq: Once | INTRAMUSCULAR | Status: AC
  Administered 2022-03-14: 2.5 mg via SUBCUTANEOUS
  Filled 2022-03-14: qty 1

## 2022-03-14 NOTE — Progress Notes (Signed)
? ?  ?Palliative Medicine ?[facility]  ?Telephone:([phone] [phone] ? ? ?Name: Caleb Simmons ?Date: 03/14/2022 ?MRN: [id number]  ?DOB: 06-05-1951 ? ?Patient Care Team: ?Pcp, No as PCP - General ?Buntin, Lavonda Jumbo, RN as Registered Nurse ?Pickenpack-Cousar, Carlena Sax, NP as Nurse Practitioner (Nurse Practitioner)  ? ? ?INTERVAL HISTORY: ?Caleb Simmons is a 71 y.o. 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  Recently admitted and discharged on 01/20/22 after receiving treatment for left MCA CVA. Palliative following for ongoing goals of care discussions and symptom management.  ? ?SOCIAL HISTORY:    ? reports that he has been smoking cigarettes. He has a 6.25 pack-year smoking history. He has never used smokeless tobacco. ? ?ADVANCE DIRECTIVES:  ?Patient reports completed document.  Son Caleb, Simmons. is his healthcare power of attorney. ? ?CODE STATUS: Full code ? ?PAST MEDICAL HISTORY:History reviewed. No pertinent past medical history. ? ?ALLERGIES:  has No Known Allergies. ? ?MEDICATIONS:  ?Current Outpatient Medications  ?Medication Sig Dispense Refill  ? senna-docusate (SENNA S) 8.6-50 MG tablet Take 1 tablet by mouth daily. 30 tablet 1  ? acetaminophen (TYLENOL) 325 MG tablet Take 2 tablets (650 mg total) by mouth every 6 (six) hours as needed for moderate pain (headache).    ? ALPRAZolam (XANAX) 0.5 MG tablet TAKE 1 TABLET BY MOUTH 2 TIMES DAILY AS NEEDED FOR ANXIETY OR SLEEP 60 tablet 0  ? aspirin 81 MG EC tablet Take 1 tablet (81 mg total) by mouth daily. Swallow whole. 30 tablet 11  ? atorvastatin (LIPITOR) 40 MG tablet Take 1 tablet (40 mg total) by mouth at bedtime. 90 tablet 1  ? calcitRIOL (ROCALTROL) 0.25 MCG capsule Take 1 capsule (0.25 mcg total) by mouth every Monday, Wednesday, and Friday with hemodialysis. 30 capsule 1  ? cyclophosphamide  (CYTOXAN) 50 MG capsule Take 9 capsules (450 mg total) by mouth once a week. Take with food to minimize GI upset. Take early in the day and maintain hydration. 36 capsule 1  ? folic acid (FOLVITE) 1 MG tablet TAKE 2 TABLETS BY MOUTH EVERY DAY 60 tablet 0  ? Melatonin 10 MG TABS Take 10 mg by mouth at bedtime.    ? metoprolol succinate (TOPROL XL) 25 MG 24 hr tablet Take 0.5 tablets (12.5 mg total) by mouth daily. 15 tablet 1  ? midodrine (PROAMATINE) 5 MG tablet Take 1 tablet (5 mg total) by mouth 3 (three) times daily with meals. 270 tablet 3  ? mirtazapine (REMERON) 30 MG tablet Take 1 tablet (30 mg total) by mouth at bedtime. 30 tablet 1  ? ondansetron (ZOFRAN) 4 MG tablet Take 1 tablet (4 mg total) by mouth every 8 (eight) hours as needed for nausea or vomiting. 45 tablet 2  ? oxyCODONE (OXY IR/ROXICODONE) 5 MG immediate release tablet Take 1 tablet (5 mg total) by mouth every 6 (six) hours as needed for severe pain. 60 tablet 0  ? pantoprazole (PROTONIX) 40 MG tablet TAKE 1 TABLET BY MOUTH TWICE DAILY BEFORE A MEAL 60 tablet 0  ? prochlorperazine (COMPAZINE) 5 MG tablet Take 1 tablet (5 mg total) by mouth every 6 (six) hours as needed for nausea or vomiting. 30 tablet 2  ? sevelamer carbonate (RENVELA) 800 MG tablet Take 800 mg by mouth 3 (three) times daily with meals.    ? tamsulosin (FLOMAX) 0.4 MG CAPS capsule TAKE  1 CAPSULE BY MOUTH EVERY DAY 30 capsule 0  ? ticagrelor (BRILINTA) 90 MG TABS tablet Take 1 tablet (90 mg total) by mouth 2 (two) times daily. 60 tablet 0  ? zolpidem (AMBIEN) 5 MG tablet Take 1 tablet (5 mg total) by mouth at bedtime as needed for sleep. 30 tablet 1  ? ?No current facility-administered medications for this visit.  ? ?Facility-Administered Medications Ordered in Other Visits  ?Medication Dose Route Frequency Provider Last Rate Last Admin  ? heparin lock flush 100 unit/mL  500 Units Intracatheter Once Truitt Merle, MD      ? sodium chloride flush (NS) 0.9 % injection 10 mL  10 mL  Intracatheter Once Truitt Merle, MD      ? ? ?VITAL SIGNS: ?BP 104/76 (BP Location: Left Arm, Patient Position: Sitting)   Pulse 84   Temp 98.3 ?F (36.8 ?C) (Oral)   Resp 16   Wt 164 lb 14.4 oz (74.8 kg)   SpO2 100%   BMI 23.66 kg/m?  ?Filed Weights  ? 03/14/22 0806  ?Weight: 164 lb 14.4 oz (74.8 kg)  ? ?  ?Estimated body mass index is 23.66 kg/m? as calculated from the following: ?  Height as of 03/07/22: '5\' 10"'$  (1.778 m). ?  Weight as of this encounter: 164 lb 14.4 oz (74.8 kg). ? ? ?PERFORMANCE STATUS (ECOG) : 2 - Symptomatic, <50% confined to bed ? ? ?Physical Exam ?General: NAD, in wheelchair  ?Cardiovascular: RRR ?Pulmonary: clear ant fields ?Abdomen: soft, nontender, + bowel sounds ?Extremities: no edema, no joint deformities ?Neurological: AAO x4, mood appropriate  ? ?IMPRESSION: ? ?Caleb Simmons presents to clinic today for follow-up. Caleb Simmons is with him. No acute distress noted. He is in a wheelchair. Reports dialysis is going well however his catheter site has been clotting up again recently. He also discontinued use of Lipitor due to severe joint pains. He has a follow-up with his Cardiologist in a month. Overall feeling well. slowly.  ? ?Pain ?Caleb Simmons reports pain is well controlled on current regimen (Oxy IR '5mg'$ ). Is taking only when needed, which is occasionally throughout the day. Does not require some days.   ?  ?Decreased Appetite ?Tolerating Remeron 30 mg. Caleb Simmons shares his appetite is up and down. He does feel that the Remeron is helping. Again expresses his appetite decreases close to his treatment days and then escalates the day of treatment and for days after. He has not loss any weight. Current weight is 164lbs up from 160lbs on 3/2. Will continue to monitor closely.  ? ?Constipation ?Is actively taking Miralax daily for bowel regimen. Endorses some constipation despite Miralax. Caleb Simmons shares observance of increased pain when he has not had a bowel movement for several days. Recommended to include  Senna daily for additional support. If he notices he begins to have loose stools may take every other day.  ? ?Insomnia ?Reports resting well. Much improved. Able to sleep throughout the night and will occasionally take a nap during the day.  ? ?PLAN: ?Pain well controlled. No changes or refills needed at this time.  ?All symptom management needs are stable with no adjustments required.  Will closely monitor appetite and make adjustments if no increase or further weight decline. Education provided on small frequent meals. Careful considerations given he is an active dialysis patient. Recommendations for Neproshakes for added nourishment.  ?Miralax twice daily ?Senna-S daily ?I will plan to see him back in 3-4 weeks in collaboration with his other oncology appointments. ? ? ?  Patient expressed understanding and was in agreement with this plan. He also understands that He can call the clinic at any time with any questions, concerns, or complaints.  ? ?Time Total: 20 min  ? ?Visit consisted of counseling and education dealing with the complex and emotionally intense issues of symptom management and palliative care in the setting of serious and potentially life-threatening illness.Greater than 50%  of this time was spent counseling and coordinating care related to the above assessment and plan. ? ?Alda Lea, AGPCNP-BC  ?[facility] ? ? ? ? ?  ?

## 2022-03-14 NOTE — Progress Notes (Signed)
Per Ramona PA, okay to treat today with creatinine 3.65 mg/dL.  ? ?1010 AM: Pt took 9 capsules of oral cytoxan as prescribed.  ?

## 2022-03-14 NOTE — Patient Instructions (Signed)
[facility]  Discharge Instructions: ?Thank you for choosing [facility] to provide your oncology and hematology care.  ? ?If you have a lab appointment with the [facility], please go directly to the [facility] and check in at the registration area. ?  ?Wear comfortable clothing and clothing appropriate for easy access to any Portacath or PICC line.  ? ?We strive to give you quality time with your provider. You may need to reschedule your appointment if you arrive late (15 or more minutes).  Arriving late affects you and other patients whose appointments are after yours.  Also, if you miss three or more appointments without notifying the office, you may be dismissed from the clinic at the provider?s discretion.    ?  ?For prescription refill requests, have your pharmacy contact our office and allow 72 hours for refills to be completed.   ? ?Today you received the following chemotherapy and/or immunotherapy agents: Bortezomib (Velcade) and Daratumumab (Darzalex Faspro). ?  ?To help prevent nausea and vomiting after your treatment, we encourage you to take your nausea medication as directed. ? ?BELOW ARE SYMPTOMS THAT SHOULD BE REPORTED IMMEDIATELY: ?*FEVER GREATER THAN 100.4 F (38 ?C) OR HIGHER ?*CHILLS OR SWEATING ?*NAUSEA AND VOMITING THAT IS NOT CONTROLLED WITH YOUR NAUSEA MEDICATION ?*UNUSUAL SHORTNESS OF BREATH ?*UNUSUAL BRUISING OR BLEEDING ?*URINARY PROBLEMS (pain or burning when urinating, or frequent urination) ?*BOWEL PROBLEMS (unusual diarrhea, constipation, pain near the anus) ?TENDERNESS IN MOUTH AND THROAT WITH OR WITHOUT PRESENCE OF ULCERS (sore throat, sores in mouth, or a toothache) ?UNUSUAL RASH, SWELLING OR PAIN  ?UNUSUAL VAGINAL DISCHARGE OR ITCHING  ? ?Items with * indicate a potential emergency and should be followed up as soon as possible or go to the Emergency Department if any problems should occur. ? ?Please show the CHEMOTHERAPY ALERT CARD  or IMMUNOTHERAPY ALERT CARD at check-in to the Emergency Department and triage nurse. ? ?Should you have questions after your visit or need to cancel or reschedule your appointment, please contact [facility]  Dept: [phone]  and follow the prompts.  Office hours are 8:00 a.m. to 4:30 p.m. Monday - Friday. Please note that voicemails left after 4:00 p.m. may not be returned until the following business day.  We are closed weekends and major holidays. You have access to a nurse at all times for urgent questions. Please call the main number to the clinic Dept: [phone] and follow the prompts. ? ? ?For any non-urgent questions, you may also contact your provider using MyChart. We now offer e-Visits for anyone 28 and older to request care online for non-urgent symptoms. For details visit [email or web address]. ?  ?Also download the MyChart app! Go to the app store, search "MyChart", open the app, select [facility], and log in with your MyChart username and password. ? ?Due to Covid, a mask is required upon entering the hospital/clinic. If you do not have a mask, one will be given to you upon arrival. For doctor visits, patients may have 1 support Elfego Giammarino aged 72 or older with them. For treatment visits, patients cannot have anyone with them due to current Covid guidelines and our immunocompromised population.  ? ?

## 2022-03-21 ENCOUNTER — Other Ambulatory Visit: Payer: Self-pay | Admitting: Nurse Practitioner

## 2022-03-21 ENCOUNTER — Other Ambulatory Visit: Payer: Self-pay

## 2022-03-21 ENCOUNTER — Inpatient Hospital Stay: Payer: Commercial Managed Care - PPO

## 2022-03-21 VITALS — BP 133/77 | HR 90 | Temp 97.8°F | Resp 16 | Wt 164.8 lb

## 2022-03-21 DIAGNOSIS — E8581 Light chain (AL) amyloidosis: Secondary | ICD-10-CM

## 2022-03-21 DIAGNOSIS — Z5112 Encounter for antineoplastic immunotherapy: Secondary | ICD-10-CM | POA: Diagnosis not present

## 2022-03-21 LAB — CBC WITH DIFFERENTIAL/PLATELET
Abs Immature Granulocytes: 0.01 10*3/uL (ref 0.00–0.07)
Basophils Absolute: 0.1 10*3/uL (ref 0.0–0.1)
Basophils Relative: 1 %
Eosinophils Absolute: 0.2 10*3/uL (ref 0.0–0.5)
Eosinophils Relative: 4 %
HCT: 30.4 % — ABNORMAL LOW (ref 39.0–52.0)
Hemoglobin: 9.8 g/dL — ABNORMAL LOW (ref 13.0–17.0)
Immature Granulocytes: 0 %
Lymphocytes Relative: 22 %
Lymphs Abs: 1.1 10*3/uL (ref 0.7–4.0)
MCH: 31 pg (ref 26.0–34.0)
MCHC: 32.2 g/dL (ref 30.0–36.0)
MCV: 96.2 fL (ref 80.0–100.0)
Monocytes Absolute: 0.5 10*3/uL (ref 0.1–1.0)
Monocytes Relative: 10 %
Neutro Abs: 3.2 10*3/uL (ref 1.7–7.7)
Neutrophils Relative %: 63 %
Platelets: 145 10*3/uL — ABNORMAL LOW (ref 150–400)
RBC: 3.16 MIL/uL — ABNORMAL LOW (ref 4.22–5.81)
RDW: 16.8 % — ABNORMAL HIGH (ref 11.5–15.5)
WBC: 5.1 10*3/uL (ref 4.0–10.5)
nRBC: 0 % (ref 0.0–0.2)

## 2022-03-21 LAB — COMPREHENSIVE METABOLIC PANEL
ALT: 9 U/L (ref 0–44)
AST: 12 U/L — ABNORMAL LOW (ref 15–41)
Albumin: 3.3 g/dL — ABNORMAL LOW (ref 3.5–5.0)
Alkaline Phosphatase: 90 U/L (ref 38–126)
Anion gap: 6 (ref 5–15)
BUN: 15 mg/dL (ref 8–23)
CO2: 27 mmol/L (ref 22–32)
Calcium: 8.7 mg/dL — ABNORMAL LOW (ref 8.9–10.3)
Chloride: 104 mmol/L (ref 98–111)
Creatinine, Ser: 3.37 mg/dL (ref 0.61–1.24)
GFR, Estimated: 19 mL/min — ABNORMAL LOW (ref >60)
Glucose, Bld: 105 mg/dL — ABNORMAL HIGH (ref 70–99)
Potassium: 4.1 mmol/L (ref 3.5–5.1)
Sodium: 137 mmol/L (ref 135–145)
Total Bilirubin: 0.2 mg/dL — ABNORMAL LOW (ref 0.3–1.2)
Total Protein: 5.4 g/dL — ABNORMAL LOW (ref 6.5–8.1)

## 2022-03-21 MED ORDER — ALPRAZOLAM 0.5 MG PO TABS: ORAL_TABLET | ORAL | 0 refills | Status: DC

## 2022-03-21 MED ORDER — DEXAMETHASONE 4 MG PO TABS
20.0000 mg | ORAL_TABLET | Freq: Once | ORAL | Status: AC
  Administered 2022-03-21: 20 mg via ORAL
  Filled 2022-03-21: qty 5

## 2022-03-21 MED ORDER — BORTEZOMIB CHEMO SQ INJECTION 3.5 MG (2.5MG/ML)
1.3000 mg/m2 | Freq: Once | INTRAMUSCULAR | Status: AC
  Administered 2022-03-21: 2.5 mg via SUBCUTANEOUS
  Filled 2022-03-21: qty 1

## 2022-03-21 NOTE — Progress Notes (Signed)
Pt observed taking 450 mg of home PO Cytoxan in infusion room.  ?

## 2022-03-21 NOTE — Patient Instructions (Signed)
[facility]  Discharge Instructions: Thank you for choosing [facility] to provide your oncology and hematology care.   If you have a lab appointment with the Cancer Center, please go directly to the Cancer Center and check in at the registration area.   Wear comfortable clothing and clothing appropriate for easy access to any Portacath or PICC line.   We strive to give you quality time with your provider. You may need to reschedule your appointment if you arrive late (15 or more minutes).  Arriving late affects you and other patients whose appointments are after yours.  Also, if you miss three or more appointments without notifying the office, you may be dismissed from the clinic at the provider's discretion.      For prescription refill requests, have your pharmacy contact our office and allow 72 hours for refills to be completed.    Today you received the following chemotherapy and/or immunotherapy agents Velcade       To help prevent nausea and vomiting after your treatment, we encourage you to take your nausea medication as directed.  BELOW ARE SYMPTOMS THAT SHOULD BE REPORTED IMMEDIATELY: *FEVER GREATER THAN 100.4 F (38 C) OR HIGHER *CHILLS OR SWEATING *NAUSEA AND VOMITING THAT IS NOT CONTROLLED WITH YOUR NAUSEA MEDICATION *UNUSUAL SHORTNESS OF BREATH *UNUSUAL BRUISING OR BLEEDING *URINARY PROBLEMS (pain or burning when urinating, or frequent urination) *BOWEL PROBLEMS (unusual diarrhea, constipation, pain near the anus) TENDERNESS IN MOUTH AND THROAT WITH OR WITHOUT PRESENCE OF ULCERS (sore throat, sores in mouth, or a toothache) UNUSUAL RASH, SWELLING OR PAIN  UNUSUAL VAGINAL DISCHARGE OR ITCHING   Items with * indicate a potential emergency and should be followed up as soon as possible or go to the Emergency Department if any problems should occur.  Please show the CHEMOTHERAPY ALERT CARD or IMMUNOTHERAPY ALERT CARD at check-in to  the Emergency Department and triage nurse.  Should you have questions after your visit or need to cancel or reschedule your appointment, please contact [facility]  Dept: [phone]  and follow the prompts.  Office hours are 8:00 a.m. to 4:30 p.m. Monday - Friday. Please note that voicemails left after 4:00 p.m. may not be returned until the following business day.  We are closed weekends and major holidays. You have access to a nurse at all times for urgent questions. Please call the main number to the clinic Dept: [phone] and follow the prompts.   For any non-urgent questions, you may also contact your provider using MyChart. We now offer e-Visits for anyone 18 and older to request care online for non-urgent symptoms. For details visit [email or web address].   Also download the MyChart app! Go to the app store, search "MyChart", open the app, select [facility], and log in with your MyChart username and password.  Due to Covid, a mask is required upon entering the hospital/clinic. If you do not have a mask, one will be given to you upon arrival. For doctor visits, patients may have 1 support person aged 18 or older with them. For treatment visits, patients cannot have anyone with them due to current Covid guidelines and our immunocompromised population.   

## 2022-03-25 ENCOUNTER — Other Ambulatory Visit: Payer: Self-pay | Admitting: Hematology

## 2022-03-25 ENCOUNTER — Other Ambulatory Visit (HOSPITAL_COMMUNITY): Payer: Self-pay | Admitting: Cardiology

## 2022-03-26 ENCOUNTER — Other Ambulatory Visit: Payer: Self-pay | Admitting: Nurse Practitioner

## 2022-03-26 MED ORDER — MIRTAZAPINE 30 MG PO TABS: 30.0000 mg | ORAL_TABLET | Freq: Every day | ORAL | 1 refills | Status: DC

## 2022-03-28 ENCOUNTER — Other Ambulatory Visit: Payer: Self-pay

## 2022-03-28 ENCOUNTER — Encounter: Payer: Self-pay | Admitting: Nurse Practitioner

## 2022-03-28 ENCOUNTER — Inpatient Hospital Stay: Payer: Commercial Managed Care - PPO

## 2022-03-28 ENCOUNTER — Telehealth: Payer: Self-pay

## 2022-03-28 ENCOUNTER — Encounter: Payer: Self-pay | Admitting: Hematology

## 2022-03-28 ENCOUNTER — Inpatient Hospital Stay: Payer: Commercial Managed Care - PPO | Attending: Hematology

## 2022-03-28 ENCOUNTER — Inpatient Hospital Stay (HOSPITAL_BASED_OUTPATIENT_CLINIC_OR_DEPARTMENT_OTHER): Payer: Commercial Managed Care - PPO | Admitting: Nurse Practitioner

## 2022-03-28 ENCOUNTER — Inpatient Hospital Stay (HOSPITAL_BASED_OUTPATIENT_CLINIC_OR_DEPARTMENT_OTHER): Payer: Commercial Managed Care - PPO | Admitting: Hematology

## 2022-03-28 VITALS — BP 106/72 | HR 95 | Temp 98.2°F | Resp 18 | Ht 70.0 in | Wt 162.3 lb

## 2022-03-28 DIAGNOSIS — E8581 Light chain (AL) amyloidosis: Secondary | ICD-10-CM

## 2022-03-28 DIAGNOSIS — I509 Heart failure, unspecified: Secondary | ICD-10-CM | POA: Diagnosis not present

## 2022-03-28 DIAGNOSIS — C9000 Multiple myeloma not having achieved remission: Secondary | ICD-10-CM | POA: Diagnosis present

## 2022-03-28 DIAGNOSIS — N186 End stage renal disease: Secondary | ICD-10-CM | POA: Insufficient documentation

## 2022-03-28 DIAGNOSIS — G893 Neoplasm related pain (acute) (chronic): Secondary | ICD-10-CM

## 2022-03-28 DIAGNOSIS — R630 Anorexia: Secondary | ICD-10-CM

## 2022-03-28 DIAGNOSIS — E785 Hyperlipidemia, unspecified: Secondary | ICD-10-CM | POA: Diagnosis not present

## 2022-03-28 DIAGNOSIS — Z5112 Encounter for antineoplastic immunotherapy: Secondary | ICD-10-CM | POA: Insufficient documentation

## 2022-03-28 DIAGNOSIS — Z515 Encounter for palliative care: Secondary | ICD-10-CM

## 2022-03-28 DIAGNOSIS — D631 Anemia in chronic kidney disease: Secondary | ICD-10-CM | POA: Insufficient documentation

## 2022-03-28 DIAGNOSIS — I951 Orthostatic hypotension: Secondary | ICD-10-CM | POA: Diagnosis not present

## 2022-03-28 DIAGNOSIS — F419 Anxiety disorder, unspecified: Secondary | ICD-10-CM

## 2022-03-28 DIAGNOSIS — R530 Neoplastic (malignant) related fatigue: Secondary | ICD-10-CM

## 2022-03-28 LAB — CBC WITH DIFFERENTIAL/PLATELET
Abs Immature Granulocytes: 0.02 10*3/uL (ref 0.00–0.07)
Basophils Absolute: 0.1 10*3/uL (ref 0.0–0.1)
Basophils Relative: 1 %
Eosinophils Absolute: 0.2 10*3/uL (ref 0.0–0.5)
Eosinophils Relative: 4 %
HCT: 31.9 % — ABNORMAL LOW (ref 39.0–52.0)
Hemoglobin: 10.4 g/dL — ABNORMAL LOW (ref 13.0–17.0)
Immature Granulocytes: 0 %
Lymphocytes Relative: 19 %
Lymphs Abs: 1 10*3/uL (ref 0.7–4.0)
MCH: 30.8 pg (ref 26.0–34.0)
MCHC: 32.6 g/dL (ref 30.0–36.0)
MCV: 94.4 fL (ref 80.0–100.0)
Monocytes Absolute: 0.6 10*3/uL (ref 0.1–1.0)
Monocytes Relative: 11 %
Neutro Abs: 3.3 10*3/uL (ref 1.7–7.7)
Neutrophils Relative %: 65 %
Platelets: 119 10*3/uL — ABNORMAL LOW (ref 150–400)
RBC: 3.38 MIL/uL — ABNORMAL LOW (ref 4.22–5.81)
RDW: 16.6 % — ABNORMAL HIGH (ref 11.5–15.5)
WBC: 5.1 10*3/uL (ref 4.0–10.5)
nRBC: 0 % (ref 0.0–0.2)

## 2022-03-28 LAB — COMPREHENSIVE METABOLIC PANEL
ALT: 8 U/L (ref 0–44)
AST: 11 U/L — ABNORMAL LOW (ref 15–41)
Albumin: 3.3 g/dL — ABNORMAL LOW (ref 3.5–5.0)
Alkaline Phosphatase: 92 U/L (ref 38–126)
Anion gap: 7 (ref 5–15)
BUN: 17 mg/dL (ref 8–23)
CO2: 25 mmol/L (ref 22–32)
Calcium: 8.5 mg/dL — ABNORMAL LOW (ref 8.9–10.3)
Chloride: 104 mmol/L (ref 98–111)
Creatinine, Ser: 3.37 mg/dL (ref 0.61–1.24)
GFR, Estimated: 19 mL/min — ABNORMAL LOW (ref >60)
Glucose, Bld: 133 mg/dL — ABNORMAL HIGH (ref 70–99)
Potassium: 4.3 mmol/L (ref 3.5–5.1)
Sodium: 136 mmol/L (ref 135–145)
Total Bilirubin: 0.3 mg/dL (ref 0.3–1.2)
Total Protein: 5.6 g/dL — ABNORMAL LOW (ref 6.5–8.1)

## 2022-03-28 MED ORDER — DIPHENHYDRAMINE HCL 25 MG PO CAPS
50.0000 mg | ORAL_CAPSULE | Freq: Once | ORAL | Status: AC
  Administered 2022-03-28: 50 mg via ORAL
  Filled 2022-03-28: qty 2

## 2022-03-28 MED ORDER — DARATUMUMAB-HYALURONIDASE-FIHJ 1800-30000 MG-UT/15ML ~~LOC~~ SOLN
1800.0000 mg | Freq: Once | SUBCUTANEOUS | Status: AC
  Administered 2022-03-28: 1800 mg via SUBCUTANEOUS
  Filled 2022-03-28: qty 15

## 2022-03-28 MED ORDER — DEXAMETHASONE 4 MG PO TABS
20.0000 mg | ORAL_TABLET | Freq: Once | ORAL | Status: AC
  Administered 2022-03-28: 20 mg via ORAL
  Filled 2022-03-28: qty 5

## 2022-03-28 MED ORDER — ONDANSETRON HCL 4 MG PO TABS: 4.0000 mg | ORAL_TABLET | Freq: Three times a day (TID) | ORAL | 2 refills | Status: DC | PRN

## 2022-03-28 MED ORDER — ACETAMINOPHEN 325 MG PO TABS
650.0000 mg | ORAL_TABLET | Freq: Once | ORAL | Status: AC
  Administered 2022-03-28: 650 mg via ORAL
  Filled 2022-03-28: qty 2

## 2022-03-28 MED ORDER — PROCHLORPERAZINE MALEATE 5 MG PO TABS: 5.0000 mg | ORAL_TABLET | Freq: Four times a day (QID) | ORAL | 2 refills | Status: DC | PRN

## 2022-03-28 MED ORDER — OXYCODONE HCL 5 MG PO TABS: 5.0000 mg | ORAL_TABLET | ORAL | 0 refills | Status: DC | PRN

## 2022-03-28 NOTE — Telephone Encounter (Signed)
CRITICAL VALUE STICKER ? ?CRITICAL VALUE:  creatinine  3.37 ? ?RECEIVER (on-site recipient of call): [employer], LPN ? ?DATE & TIME NOTIFIED: 09:21 ? ?MESSENGER (representative from lab):  Lauren ? ?MD NOTIFIED:   Burr Medico ? ?TIME OF NOTIFICATION:  09:236 ? ?RESPONSE:  ? ?

## 2022-03-28 NOTE — Progress Notes (Signed)
?[facility]   ?Telephone:([phone]) [phone] [phone]   ?Clinic Follow up Note  ? ?Patient Care Team: ?Pcp, No as PCP - General ?Buntin, Lavonda Jumbo, RN as Registered Nurse ?Pickenpack-Cousar, Carlena Sax, NP as Nurse Practitioner (Nurse Practitioner) ? ?Date of Service:  03/28/2022 ? ?CHIEF COMPLAINT: f/u of multiorgan AL amyloidosis ? ?CURRENT THERAPY:  ?Daratumumab injection started 09/27/21, now on monthly maintenance ? ?ASSESSMENT & PLAN:  ?Caleb Simmons is a 71 y.o. male with  ? ?1. AL amyloidosis with renal, cardiac and neuro involvement  ?-initially presented with weakness and SOB; lab work up showed BUN of 44, Cr 6.5, significant proteinuria/RBC on UA. Right renal biopsy on 08/28/21 revealing AL amyloidosis, lambda light chain composition. 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 Both serum and urine lamda Free light chains significantly elevated. ?-Due to the multiorgan involvement, with significant CHF (EF 45-50%) and renal failure, his prognosis is very poor. ?-He began weekly oral Cytoxan, dexa and Velcade injection while inpatient on 09/10/21.  He is tolerating well, plan to continue for 6 months ?-He began daratumumab injection 09/27/21. ?-his light chain levels showed improvement after starting treatment, now overall stable. ?-not a candidate for biphosphonate due to renal failure ?-he continues to tolerate treatment moderately well overall. Chemo was held briefly following stroke 01/14/22. ?-he completed 6 months of CyBorD on 03/21/22. He will continue on Dara maintenance, to be given every 4 weeks, starting today. Labs reviewed, overall stable, adequate to proceed with Dara. ?  ?2. Symptom management: Nausea, Insomnia, Low appetite, constipation, anxiety ?-zofran helps his nausea ?-he was prescribed remeron and ambien on 10/18/21 by NP Lexine Baton. They do not feel the remeron is helping. She increased his dose to 30 mg on 12/27/21 ?-dexa decreased from 21m to 247mweekly due to  insomnia  ?-he is sleeping better with Ambien, Xanax, and Remeron ?-He has moderate nausea on Zofran, I recommend to add Compazine and alternate the two ?  ?3. Orthostatic Hypotension and CHF  ?-Likely secondary to amyloidosis with neuro involvement  ?-He has significant symptomatic orthostasis, SBP drops to 70's when he stands up ?-he met with Dr. McAundra Dubinn 10/23/21 and was started on midodrine. ?  ?4. 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  Also on folic acid ?-stable ?  ?5. ESRD on HD, secondary to #1 ?-he is on dialysis Mon, Wed, Fri. ?-Dr FeBurr Medicoreviously spoke with his nephrologist to see if he can do less dialysis since his amyloidosis has improved. At this time, it's not recommended, but maybe at a later time. ?-creatinine has been stable in 3-range. ?  ?6. Acute Stroke 01/14/22 ?-presented with neuro changes, including facial droop, slurred speech, and right-sided weakness. MRI showed numerous infarctions scattered within left MCA. 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 He did not require stent. ?-He took Brilinta x1 month after hospitalization and has stopped, he continues baby aspirin ? ?7. Hyperlipidemia  ?-he was started on lipitor while in the hospital 01/14/22 for LDL of 85.5. ?-given side effects from Lipitor, I advised him to discuss with his PCP. He notes he doesn't have a PCP. ? ? ?PLAN: ?-proceed with maintenance daratumumab today and continue every 4 weeks  ?-discontinue oral cytoxan (completed)  ?-lab, flush, f/u, and Dara injection in 4 weeks ? ? ?No problem-specific Assessment & Plan notes found for  this encounter. ? ? ?INTERVAL HISTORY:  ?Caleb Simmons is here for a follow up of AL amyloidosis. He was last seen by NP Lacie on 02/28/22 with palliative care visit in the interim. He presents to the clinic  accompanied by his wife. ?He reports he has a lot of nausea and fatigue after treatment. ?He endorses using oxycodone for whole-body pains. ?  ?All other systems were reviewed with the patient and are negative. ? ?MEDICAL HISTORY:  ?History reviewed. No pertinent past medical history. ? ?SURGICAL HISTORY: ?Past Surgical History:  ?Procedure Laterality Date  ? APPENDECTOMY    ? IR ANGIO INTRA EXTRACRAN SEL COM CAROTID INNOMINATE BILAT MOD SED  01/16/2022  ? IR ANGIO VERTEBRAL SEL SUBCLAVIAN INNOMINATE BILAT MOD SED  01/16/2022  ? IR CT HEAD LTD  01/18/2022  ? IR EMBO ART  VEN HEMORR LYMPH EXTRAV  INC GUIDE ROADMAPPING  08/30/2021  ? IR FLUORO GUIDE CV LINE RIGHT  08/30/2021  ? IR FLUORO GUIDE CV LINE RIGHT  09/18/2021  ? IR PERCUTANEOUS ART THROMBECTOMY/INFUSION INTRACRANIAL INC DIAG ANGIO  01/18/2022  ? IR RENAL SELECTIVE  UNI INC S&I MOD SED  08/31/2021  ? IR US GUIDE VASC ACCESS RIGHT  08/30/2021  ? IR US GUIDE VASC ACCESS RIGHT  08/30/2021  ? IR US GUIDE VASC ACCESS RIGHT  09/18/2021  ? IR US GUIDE VASC ACCESS RIGHT  01/16/2022  ? RADIOLOGY WITH ANESTHESIA N/A 01/18/2022  ? Procedure: Cerebral angioplasty with possible stenting;  Surgeon: Luanne Bras, MD;  Location: [facility];  Service: Radiology;  Laterality: N/A;  ? ? ?I have reviewed the social history and family history with the patient and they are unchanged from previous note. ? ?ALLERGIES:  has No Known Allergies. ? ?MEDICATIONS:  ?Current Outpatient Medications  ?Medication Sig Dispense Refill  ? acetaminophen (TYLENOL) 325 MG tablet Take 2 tablets (650 mg total) by mouth every 6 (six) hours as needed for moderate pain (headache).    ? ALPRAZolam (XANAX) 0.5 MG tablet TAKE 1 TABLET BY MOUTH 2 TIMES DAILY AS NEEDED FOR ANXIETY OR SLEEP 60 tablet 0  ? aspirin 81 MG EC tablet Take 1 tablet (81 mg total) by mouth daily. Swallow whole. 30 tablet 11  ? calcitRIOL (ROCALTROL) 0.25 MCG capsule Take 1 capsule (0.25 mcg total) by mouth every Monday, Wednesday, and Friday with  hemodialysis. 30 capsule 1  ? cyclophosphamide (CYTOXAN) 50 MG capsule Take 9 capsules (450 mg total) by mouth once a week. Take with food to minimize GI upset. Take early in the day and maintain hydration. 36 capsule 1  ? folic acid (FOLVITE) 1 MG tablet TAKE 2 TABLETS BY MOUTH EVERY DAY 60 tablet 0  ? Melatonin 10 MG TABS Take 10 mg by mouth at bedtime.    ? metoprolol succinate (TOPROL-XL) 25 MG 24 hr tablet TAKE 1/2 TABLET BY MOUTH EVERY DAY 15 tablet 4  ? midodrine (PROAMATINE) 5 MG tablet Take 1 tablet (5 mg total) by mouth 3 (three) times daily with meals. 270 tablet 3  ? mirtazapine (REMERON) 30 MG tablet Take 1 tablet (30 mg total) by mouth at bedtime. 30 tablet 1  ? ondansetron (ZOFRAN) 4 MG tablet Take 1 tablet (4 mg total) by mouth every 8 (eight) hours as needed for nausea or vomiting. 45 tablet 2  ? oxyCODONE (OXY IR/ROXICODONE) 5 MG immediate release tablet Take 1-2 tablets (5-10 mg total) by mouth every 4 (four) hours as needed for severe pain or moderate pain. [place]  tablet 0  ? pantoprazole (PROTONIX) 40 MG tablet TAKE 1 TABLET BY MOUTH 2 TIMES DAILY BEFORE A MEAL 60 tablet 0  ? prochlorperazine (COMPAZINE) 5 MG tablet Take 1 tablet (5 mg total) by mouth every 6 (six) hours as needed for nausea or vomiting. 30 tablet 2  ? senna-docusate (SENNA S) 8.6-50 MG tablet Take 1 tablet by mouth daily. 30 tablet 1  ? sevelamer carbonate (RENVELA) 800 MG tablet Take 800 mg by mouth 3 (three) times daily with meals.    ? tamsulosin (FLOMAX) 0.4 MG CAPS capsule TAKE 1 CAPSULE BY MOUTH EVERY DAY 30 capsule 0  ? ticagrelor (BRILINTA) 90 MG TABS tablet Take 1 tablet (90 mg total) by mouth 2 (two) times daily. 60 tablet 0  ? zolpidem (AMBIEN) 5 MG tablet Take 1 tablet (5 mg total) by mouth at bedtime as needed for sleep. 30 tablet 1  ? ?No current facility-administered medications for this visit.  ? ?Facility-Administered Medications Ordered in Other Visits  ?Medication Dose Route Frequency Provider Last Rate Last  Admin  ? heparin lock flush 100 unit/mL  500 Units Intracatheter Once Truitt Merle, MD      ? sodium chloride flush (NS) 0.9 % injection 10 mL  10 mL Intracatheter Once Truitt Merle, MD      ? ? ?PHYSICAL EXAMINATION: ?ECOG

## 2022-03-28 NOTE — Progress Notes (Signed)
? ?  ?Palliative Medicine ?[place]  ?[phone] [phone] ? ? ?Name: Caleb Simmons ?Date: 03/28/2022 ?MRN: [id number]  ?DOB: October 20, 1951 ? ?Patient Care Team: ?Pcp, No as PCP - General ?Buntin, Lavonda Jumbo, RN as Registered Nurse ?Pickenpack-Cousar, Carlena Sax, NP as Nurse Practitioner (Nurse Practitioner)  ? ? ?INTERVAL HISTORY: ?Caleb Simmons is a 71 y.o. 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  Recently admitted and discharged on 01/20/22 after receiving treatment for left MCA CVA. Palliative following for ongoing goals of care discussions and symptom management.  ? ?SOCIAL HISTORY:    ? reports that he has been smoking cigarettes. He has a 6.25 pack-year smoking history. He has never used smokeless tobacco. ? ?ADVANCE DIRECTIVES:  ?Patient reports completed document.  [name]. is his healthcare power of attorney. ? ?CODE STATUS: Full code ? ?PAST MEDICAL HISTORY:History reviewed. No pertinent past medical history. ? ?ALLERGIES:  has No Known Allergies. ? ?MEDICATIONS:  ?Current Outpatient Medications  ?Medication Sig Dispense Refill  ? acetaminophen (TYLENOL) 325 MG tablet Take 2 tablets (650 mg total) by mouth every 6 (six) hours as needed for moderate pain (headache).    ? ALPRAZolam (XANAX) 0.5 MG tablet TAKE 1 TABLET BY MOUTH 2 TIMES DAILY AS NEEDED FOR ANXIETY OR SLEEP 60 tablet 0  ? aspirin 81 MG EC tablet Take 1 tablet (81 mg total) by mouth daily. Swallow whole. 30 tablet 11  ? calcitRIOL (ROCALTROL) 0.25 MCG capsule Take 1 capsule (0.25 mcg total) by mouth every Monday, Wednesday, and Friday with hemodialysis. 30 capsule 1  ? cyclophosphamide (CYTOXAN) 50 MG capsule Take 9 capsules (450 mg total) by mouth once a week. Take with food to minimize GI upset. Take early in the day and maintain hydration. 36 capsule 1  ? folic acid  (FOLVITE) 1 MG tablet TAKE 2 TABLETS BY MOUTH EVERY DAY 60 tablet 0  ? Melatonin 10 MG TABS Take 10 mg by mouth at bedtime.    ? metoprolol succinate (TOPROL-XL) 25 MG 24 hr tablet TAKE 1/2 TABLET BY MOUTH EVERY DAY 15 tablet 4  ? midodrine (PROAMATINE) 5 MG tablet Take 1 tablet (5 mg total) by mouth 3 (three) times daily with meals. 270 tablet 3  ? mirtazapine (REMERON) 30 MG tablet Take 1 tablet (30 mg total) by mouth at bedtime. 30 tablet 1  ? ondansetron (ZOFRAN) 4 MG tablet Take 1 tablet (4 mg total) by mouth every 8 (eight) hours as needed for nausea or vomiting. 45 tablet 2  ? oxyCODONE (OXY IR/ROXICODONE) 5 MG immediate release tablet Take 1 tablet (5 mg total) by mouth every 6 (six) hours as needed for severe pain. 60 tablet 0  ? pantoprazole (PROTONIX) 40 MG tablet TAKE 1 TABLET BY MOUTH 2 TIMES DAILY BEFORE A MEAL 60 tablet 0  ? prochlorperazine (COMPAZINE) 5 MG tablet Take 1 tablet (5 mg total) by mouth every 6 (six) hours as needed for nausea or vomiting. 30 tablet 2  ? senna-docusate (SENNA S) 8.6-50 MG tablet Take 1 tablet by mouth daily. 30 tablet 1  ? sevelamer carbonate (RENVELA) 800 MG tablet Take 800 mg by mouth 3 (three) times daily with meals.    ? tamsulosin (FLOMAX) 0.4 MG CAPS capsule TAKE 1 CAPSULE BY MOUTH EVERY DAY 30 capsule 0  ? ticagrelor (BRILINTA) 90 MG TABS tablet Take 1 tablet (90 mg  total) by mouth 2 (two) times daily. 60 tablet 0  ? zolpidem (AMBIEN) 5 MG tablet Take 1 tablet (5 mg total) by mouth at bedtime as needed for sleep. 30 tablet 1  ? ?No current facility-administered medications for this visit.  ? ?Facility-Administered Medications Ordered in Other Visits  ?Medication Dose Route Frequency Provider Last Rate Last Admin  ? heparin lock flush 100 unit/mL  500 Units Intracatheter Once Truitt Merle, MD      ? sodium chloride flush (NS) 0.9 % injection 10 mL  10 mL Intracatheter Once Truitt Merle, MD      ? ? ?VITAL SIGNS: ?There were no vitals taken for this visit. ?There were no  vitals filed for this visit. ? ?  ?Estimated body mass index is 23.29 kg/m? as calculated from the following: ?  Height as of an earlier encounter on 03/28/22: '5\' 10"'$  (1.778 m). ?  Weight as of an earlier encounter on 03/28/22: 162 lb 4.8 oz (73.6 kg). ? ? ?PERFORMANCE STATUS (ECOG) : 2 - Symptomatic, <50% confined to bed ? ? ?Physical Exam ?General: NAD, in wheelchair, appears uncomfortable ?Cardiovascular: RRR ?Pulmonary: clear ant fields ?Abdomen: soft, nontender, + bowel sounds ?Extremities: no edema, no joint deformities ?Neurological: AAO x4, mood appropriate  ? ?IMPRESSION: ? ?Mr. Dupin presents to clinic today for follow-up. [name] is with him. He is in a wheelchair. Rubbing thighs and knees. States over the past 1-2 weeks pain has significantly increased resulting in decreased activity and difficulty sleeping through the night. Dialysis is going well. Bone pain seemed to start when he was on Lipitor however he discontinued this several weeks ago at onset. Feels pain somewhat improved in hips and back but remains in thigh and knee areas.  ? ?Pain ?[name] reports pain has increased significantly over the past 1-2 weeks. He initially attributed this to the use of Lipitor however it has been over 2 weeks now. Pain does not allow him to sleep well during the night. He has been taking his Oxy IR around the clock every 4-5 hours due to the pain. Feels this is effective and helps to decrease the pain however it does return within several hours. This is a change for him as he was only requiring to take 2-3 times a day if that and some days did not have to take as he was not in significant pain.  ? ?  ?Decreased Appetite ?Tolerating Remeron 30 mg. [name] shares his appetite is up and down. He feels that the Remeron is helping some. We discussed making adjustments or considering another medication to gain better results. He would like to continue with current regimen for now. Feels appetite has been somewhat decreased due to  his pain. Continues to express his appetite decreases close to his treatment days and then escalates the day of treatment and for days after. Weight is down 2lbs since previous visit. Current weight is 162.4lb down from164lbs on 3/30, 160lbs on 3/2. Will continue to monitor closely.  ? ?Constipation ?Is actively taking Miralax daily for bowel regimen. Endorses some constipation despite Miralax. [name] shares observance of increased pain when he has not had a bowel movement for several days. Recommended to include Senna daily for additional support. If he notices he begins to have loose stools may take every other day.  ? ?Insomnia ?Ishmail has been resting well up until the past 2 weeks. Again attributes most of his changes to pain and discomfort. Did not sleep well last night. Reports being  up and down due to discomfort in his legs. He does have ativan available also which [name] states he is taking. Will continue to closely monitor with hopes if pain becomes manageable this will improve his insomnia.  ? ?PLAN: ?Oxy IR 5-10 mg every 4-6 hours. Will continue to monitor closely. If pain continues will consider adding more long-acting. Advised given pain is increased compared to several weeks will make sure this is not an acute event. He and [name] verbalized understanding.  ?Will closely monitor appetite and make adjustments if no increase or further weight decline. Education provided on small frequent meals. Careful considerations given he is an active dialysis patient. Recommendations for Neproshakes for added nourishment. Currently tolerating Remeron. Weight is down 2lbs since last visit.  ?Ativan as needed for anxiety/sleep ?Miralax twice daily ?Senna-S daily ?Zofran as needed for nausea.  ?I will plan to see him back in 3-4 weeks in collaboration with his other oncology appointments. I will also plan to give him a call on Monday to assess current symptoms and pain.  ? ? ?Patient expressed understanding and was in  agreement with this plan. He also understands that He can call the clinic at any time with any questions, concerns, or complaints.  ? ?Time Total: 25 min  ? ?Visit consisted of counseling and education dealing w

## 2022-03-28 NOTE — Progress Notes (Signed)
Pt given 450 mg (9 pills) of PO Cytoxan. Verified by Thurmond Butts, RN. ?

## 2022-03-28 NOTE — Patient Instructions (Signed)
[facility]  Discharge Instructions: Thank you for choosing [facility] to provide your oncology and hematology care.   If you have a lab appointment with the Cancer Center, please go directly to the Cancer Center and check in at the registration area.   Wear comfortable clothing and clothing appropriate for easy access to any Portacath or PICC line.   We strive to give you quality time with your provider. You may need to reschedule your appointment if you arrive late (15 or more minutes).  Arriving late affects you and other patients whose appointments are after yours.  Also, if you miss three or more appointments without notifying the office, you may be dismissed from the clinic at the provider's discretion.      For prescription refill requests, have your pharmacy contact our office and allow 72 hours for refills to be completed.    Today you received the following chemotherapy and/or immunotherapy agents: Darzalex Faspro      To help prevent nausea and vomiting after your treatment, we encourage you to take your nausea medication as directed.  BELOW ARE SYMPTOMS THAT SHOULD BE REPORTED IMMEDIATELY: *FEVER GREATER THAN 100.4 F (38 C) OR HIGHER *CHILLS OR SWEATING *NAUSEA AND VOMITING THAT IS NOT CONTROLLED WITH YOUR NAUSEA MEDICATION *UNUSUAL SHORTNESS OF BREATH *UNUSUAL BRUISING OR BLEEDING *URINARY PROBLEMS (pain or burning when urinating, or frequent urination) *BOWEL PROBLEMS (unusual diarrhea, constipation, pain near the anus) TENDERNESS IN MOUTH AND THROAT WITH OR WITHOUT PRESENCE OF ULCERS (sore throat, sores in mouth, or a toothache) UNUSUAL RASH, SWELLING OR PAIN  UNUSUAL VAGINAL DISCHARGE OR ITCHING   Items with * indicate a potential emergency and should be followed up as soon as possible or go to the Emergency Department if any problems should occur.  Please show the CHEMOTHERAPY ALERT CARD or IMMUNOTHERAPY ALERT CARD at  check-in to the Emergency Department and triage nurse.  Should you have questions after your visit or need to cancel or reschedule your appointment, please contact [facility]  Dept: [phone]  and follow the prompts.  Office hours are 8:00 a.m. to 4:30 p.m. Monday - Friday. Please note that voicemails left after 4:00 p.m. may not be returned until the following business day.  We are closed weekends and major holidays. You have access to a nurse at all times for urgent questions. Please call the main number to the clinic Dept: [phone] and follow the prompts.   For any non-urgent questions, you may also contact your provider using MyChart. We now offer e-Visits for anyone 18 and older to request care online for non-urgent symptoms. For details visit [email or web address].   Also download the MyChart app! Go to the app store, search "MyChart", open the app, select , and log in with your MyChart username and password.  Due to Covid, a mask is required upon entering the hospital/clinic. If you do not have a mask, one will be given to you upon arrival. For doctor visits, patients may have 1 support person aged 18 or older with them. For treatment visits, patients cannot have anyone with them due to current Covid guidelines and our immunocompromised population.  

## 2022-03-29 LAB — KAPPA/LAMBDA LIGHT CHAINS
Kappa free light chain: 32.4 mg/L — ABNORMAL HIGH (ref 3.3–19.4)
Kappa, lambda light chain ratio: 1.91 — ABNORMAL HIGH (ref 0.26–1.65)
Lambda free light chains: 17 mg/L (ref 5.7–26.3)

## 2022-04-01 ENCOUNTER — Telehealth: Payer: Self-pay | Admitting: Hematology

## 2022-04-01 LAB — MULTIPLE MYELOMA PANEL, SERUM
Albumin SerPl Elph-Mcnc: 2.6 g/dL — ABNORMAL LOW (ref 2.9–4.4)
Albumin/Glob SerPl: 1.2 (ref 0.7–1.7)
Alpha 1: 0.3 g/dL (ref 0.0–0.4)
Alpha2 Glob SerPl Elph-Mcnc: 0.8 g/dL (ref 0.4–1.0)
B-Globulin SerPl Elph-Mcnc: 0.8 g/dL (ref 0.7–1.3)
Gamma Glob SerPl Elph-Mcnc: 0.3 g/dL — ABNORMAL LOW (ref 0.4–1.8)
Globulin, Total: 2.2 g/dL (ref 2.2–3.9)
IgA: 34 mg/dL — ABNORMAL LOW (ref 61–437)
IgG (Immunoglobin G), Serum: 285 mg/dL — ABNORMAL LOW (ref 603–1613)
IgM (Immunoglobulin M), Srm: 56 mg/dL (ref 15–143)
M Protein SerPl Elph-Mcnc: 0.2 g/dL — ABNORMAL HIGH
Total Protein ELP: 4.8 g/dL — ABNORMAL LOW (ref 6.0–8.5)

## 2022-04-01 NOTE — Telephone Encounter (Signed)
Left message with follow-up appointment per 4/6 los. ?

## 2022-04-02 ENCOUNTER — Telehealth: Payer: Self-pay

## 2022-04-02 NOTE — Telephone Encounter (Signed)
Our office called [name], Caleb Simmons significant other, to follow up on Caleb Simmons pain and overall wellbeing. She stated that he is still having pain and feeling very tired and "cold" at home. I asked if he was taking 2 Oxycodone tablets at a time, to which she responded that he is still only taking 1 tablet at a time due to constipation.  ?Per Lexine Baton, NP, he can increase his Miralax to twice a day and increase his Senna S to 2 tablets a day. I relayed this information to [place] who verbalized understanding. I then reinforced that he can take 2 tablets of Oxycodone every 4 hours as needed for pain. Understanding verbalized. All questions answered. Advised to call our office with any other questions or concerns.  ?

## 2022-04-04 ENCOUNTER — Other Ambulatory Visit: Payer: Self-pay | Admitting: *Deleted

## 2022-04-04 MED ORDER — ZOLPIDEM TARTRATE 5 MG PO TABS: 5.0000 mg | ORAL_TABLET | Freq: Every evening | ORAL | 1 refills | Status: DC | PRN

## 2022-04-08 ENCOUNTER — Other Ambulatory Visit (HOSPITAL_COMMUNITY): Payer: Self-pay

## 2022-04-10 ENCOUNTER — Other Ambulatory Visit: Payer: Self-pay | Admitting: Hematology

## 2022-04-11 ENCOUNTER — Other Ambulatory Visit: Payer: Self-pay | Admitting: *Deleted

## 2022-04-11 MED ORDER — SENNOSIDES-DOCUSATE SODIUM 8.6-50 MG PO TABS: 1.0000 | ORAL_TABLET | Freq: Every day | ORAL | 6 refills | Status: DC

## 2022-04-18 ENCOUNTER — Telehealth (HOSPITAL_COMMUNITY): Payer: Self-pay

## 2022-04-18 ENCOUNTER — Other Ambulatory Visit: Payer: Self-pay | Admitting: Nurse Practitioner

## 2022-04-18 MED ORDER — ALPRAZOLAM 0.5 MG PO TABS: ORAL_TABLET | ORAL | 0 refills | Status: DC

## 2022-04-18 NOTE — Telephone Encounter (Signed)
Called to schedule cta head/neck, no answer, left vm. AW 

## 2022-04-22 ENCOUNTER — Other Ambulatory Visit: Payer: Self-pay

## 2022-04-22 NOTE — Patient Outreach (Signed)
[facility]) Care Management ? ?04/22/2022 ? ?Caleb Simmons ?1951-08-03 ?[id number] ? ? ?First telephone outreach attempt to obtain mRS. No answer. Left message for returned call. ? ?[name] ?[facility]-Care Management Assistant ?[phone] ? ?

## 2022-04-25 ENCOUNTER — Inpatient Hospital Stay: Payer: Commercial Managed Care - PPO

## 2022-04-25 ENCOUNTER — Encounter: Payer: Commercial Managed Care - PPO | Admitting: Nurse Practitioner

## 2022-04-25 ENCOUNTER — Encounter (HOSPITAL_COMMUNITY): Payer: Commercial Managed Care - PPO | Admitting: Cardiology

## 2022-04-25 ENCOUNTER — Inpatient Hospital Stay: Payer: Commercial Managed Care - PPO | Attending: Hematology

## 2022-04-25 ENCOUNTER — Other Ambulatory Visit: Payer: Self-pay

## 2022-04-25 ENCOUNTER — Inpatient Hospital Stay (HOSPITAL_BASED_OUTPATIENT_CLINIC_OR_DEPARTMENT_OTHER): Payer: Commercial Managed Care - PPO | Admitting: Hematology

## 2022-04-25 VITALS — BP 95/65 | HR 48 | Temp 98.0°F | Resp 19 | Ht 70.0 in | Wt 158.5 lb

## 2022-04-25 DIAGNOSIS — N186 End stage renal disease: Secondary | ICD-10-CM | POA: Diagnosis not present

## 2022-04-25 DIAGNOSIS — D631 Anemia in chronic kidney disease: Secondary | ICD-10-CM | POA: Diagnosis not present

## 2022-04-25 DIAGNOSIS — I951 Orthostatic hypotension: Secondary | ICD-10-CM | POA: Insufficient documentation

## 2022-04-25 DIAGNOSIS — E8581 Light chain (AL) amyloidosis: Secondary | ICD-10-CM

## 2022-04-25 DIAGNOSIS — Z5112 Encounter for antineoplastic immunotherapy: Secondary | ICD-10-CM | POA: Insufficient documentation

## 2022-04-25 DIAGNOSIS — E785 Hyperlipidemia, unspecified: Secondary | ICD-10-CM | POA: Insufficient documentation

## 2022-04-25 DIAGNOSIS — I509 Heart failure, unspecified: Secondary | ICD-10-CM | POA: Insufficient documentation

## 2022-04-25 DIAGNOSIS — Z992 Dependence on renal dialysis: Secondary | ICD-10-CM | POA: Diagnosis not present

## 2022-04-25 DIAGNOSIS — C9000 Multiple myeloma not having achieved remission: Secondary | ICD-10-CM | POA: Insufficient documentation

## 2022-04-25 LAB — COMPREHENSIVE METABOLIC PANEL
ALT: 6 U/L (ref 0–44)
AST: 10 U/L — ABNORMAL LOW (ref 15–41)
Albumin: 3.5 g/dL (ref 3.5–5.0)
Alkaline Phosphatase: 95 U/L (ref 38–126)
Anion gap: 8 (ref 5–15)
BUN: 15 mg/dL (ref 8–23)
CO2: 27 mmol/L (ref 22–32)
Calcium: 8.8 mg/dL — ABNORMAL LOW (ref 8.9–10.3)
Chloride: 102 mmol/L (ref 98–111)
Creatinine, Ser: 3.37 mg/dL (ref 0.61–1.24)
GFR, Estimated: 19 mL/min — ABNORMAL LOW (ref >60)
Glucose, Bld: 117 mg/dL — ABNORMAL HIGH (ref 70–99)
Potassium: 3.8 mmol/L (ref 3.5–5.1)
Sodium: 137 mmol/L (ref 135–145)
Total Bilirubin: 0.3 mg/dL (ref 0.3–1.2)
Total Protein: 5.7 g/dL — ABNORMAL LOW (ref 6.5–8.1)

## 2022-04-25 LAB — CBC WITH DIFFERENTIAL/PLATELET
Abs Immature Granulocytes: 0.03 10*3/uL (ref 0.00–0.07)
Basophils Absolute: 0.1 10*3/uL (ref 0.0–0.1)
Basophils Relative: 1 %
Eosinophils Absolute: 0.3 10*3/uL (ref 0.0–0.5)
Eosinophils Relative: 4 %
HCT: 36.9 % — ABNORMAL LOW (ref 39.0–52.0)
Hemoglobin: 11.9 g/dL — ABNORMAL LOW (ref 13.0–17.0)
Immature Granulocytes: 0 %
Lymphocytes Relative: 22 %
Lymphs Abs: 1.8 10*3/uL (ref 0.7–4.0)
MCH: 30.7 pg (ref 26.0–34.0)
MCHC: 32.2 g/dL (ref 30.0–36.0)
MCV: 95.1 fL (ref 80.0–100.0)
Monocytes Absolute: 0.8 10*3/uL (ref 0.1–1.0)
Monocytes Relative: 10 %
Neutro Abs: 5.1 10*3/uL (ref 1.7–7.7)
Neutrophils Relative %: 63 %
Platelets: 148 10*3/uL — ABNORMAL LOW (ref 150–400)
RBC: 3.88 MIL/uL — ABNORMAL LOW (ref 4.22–5.81)
RDW: 16.1 % — ABNORMAL HIGH (ref 11.5–15.5)
WBC: 8.1 10*3/uL (ref 4.0–10.5)
nRBC: 0 % (ref 0.0–0.2)

## 2022-04-25 MED ORDER — DIPHENHYDRAMINE HCL 25 MG PO CAPS
50.0000 mg | ORAL_CAPSULE | Freq: Once | ORAL | Status: AC
  Administered 2022-04-25: 50 mg via ORAL
  Filled 2022-04-25: qty 2

## 2022-04-25 MED ORDER — DARATUMUMAB-HYALURONIDASE-FIHJ 1800-30000 MG-UT/15ML ~~LOC~~ SOLN
1800.0000 mg | Freq: Once | SUBCUTANEOUS | Status: AC
  Administered 2022-04-25: 1800 mg via SUBCUTANEOUS
  Filled 2022-04-25: qty 15

## 2022-04-25 MED ORDER — ACETAMINOPHEN 325 MG PO TABS
650.0000 mg | ORAL_TABLET | Freq: Once | ORAL | Status: AC
  Administered 2022-04-25: 650 mg via ORAL
  Filled 2022-04-25: qty 2

## 2022-04-25 MED ORDER — DEXAMETHASONE 4 MG PO TABS
20.0000 mg | ORAL_TABLET | Freq: Once | ORAL | Status: AC
  Administered 2022-04-25: 20 mg via ORAL
  Filled 2022-04-25: qty 5

## 2022-04-25 NOTE — Progress Notes (Signed)
Pt does not have port. Lab appt made for labs to be drawn peripherally.  ?

## 2022-04-25 NOTE — Progress Notes (Signed)
?[place]   ?Telephone:([phone] [phone]   ?Clinic Follow up Note  ? ?Patient Care Team: ?Pcp, No as PCP - General ?Buntin, Lavonda Jumbo, RN (Inactive) as Registered Nurse ?Pickenpack-Cousar, Carlena Sax, NP as Nurse Practitioner (Nurse Practitioner) ? ?Date of Service:  04/25/2022 ? ?CHIEF COMPLAINT: f/u of multiorgan AL amyloidosis ? ?CURRENT THERAPY:  ?Daratumumab injection started 09/27/21, now on monthly maintenance with dexa  ? ?ASSESSMENT & PLAN:  ?Caleb Simmons is a 71 y.o. male with  ? ?1. AL amyloidosis with renal, cardiac and neuro involvement  ?-initially presented with weakness and SOB; lab work up showed BUN of 44, Cr 6.5, significant proteinuria/RBC on UA. Right renal biopsy on 08/28/21 revealing AL amyloidosis, lambda light chain composition. 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 Both serum and urine lamda Free light chains significantly elevated. ?-Due to the multiorgan involvement, with significant CHF (EF 45-50%) and renal failure, his prognosis is very poor. ?-He began weekly oral Cytoxan, dexa and Velcade injection while inpatient on 09/10/21.  He tolerated well. ?-He began daratumumab injection 09/27/21. ?-his light chain levels showed improvement after starting treatment, now overall stable. ?-not a candidate for biphosphonate due to renal failure ?-he continues to tolerate treatment moderately well overall. Chemo was held briefly following stroke 01/14/22. ?-he completed 6 cycle induction chemo with CyBorD on 03/21/22 and moved to monthly Dara maintenance on 03/28/22. Labs reviewed, improved off chemo. ?-He is clinically stable, overall slightly improved appetite since he came off chemo.  Patient told me that his nephrologist may try to cut his hemodialysis. ?-His kappa light chain continues decreasing, down to 32.4 mg/L in April 2023, she still has M protein 0.1-0.2, probably related to daratumumab ?-Lab reviewed, adequate for treatment, will continue  monthly daratumumab maintenance, plan for total of 2 years therapy if he continues good response. ?  ?2. Symptom management: Low appetite, anxiety, pain ?-he is sleeping better with Ambien, Xanax, and Remeron ?-pain managed with oxycodone per NP Nikki ?  ?3. Orthostatic Hypotension and CHF  ?-Likely secondary to amyloidosis with neuro involvement  ?-He has significant symptomatic orthostasis, SBP drops to 70's when he stands up ?-he met with Dr. Aundra Dubin on 10/23/21 and was started on midodrine. ?  ?4. 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  Also on folic acid ?-stable ?  ?5. ESRD on HD, secondary to #1 ?-he is on dialysis Mon, Wed, Fri. ?-Dr Burr Medico previously spoke with his nephrologist to see if he can do less dialysis since his amyloidosis has improved. At this time, it's not recommended, but maybe at a later time. ?-creatinine has been stable in 3-range. ?  ?6. Acute Stroke 01/14/22 ?-presented with neuro changes, including facial droop, slurred speech, and right-sided weakness. MRI showed numerous infarctions scattered within left MCA. 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 He did not require stent. ?-He took Brilinta x1 month after hospitalization and has stopped, he continues baby aspirin ?  ?7. Hyperlipidemia  ?-he was started on lipitor while in the hospital 01/14/22 for LDL of 85.5. ?-given side effects from Lipitor, I advised him to discuss with his PCP. He notes he doesn't have a PCP. ?  ?  ?PLAN: ?-proceed with maintenance daratumumab and dexa today and continue every 4 weeks  ?-lab, f/u, and Dara injection every 4 weeks ? ? ?No problem-specific  Assessment & Plan notes found for this encounter. ? ? ?INTERVAL HISTORY:  ?[name] is here for a follow up of amyloidosis. He was last seen by me on 03/28/22. He presents to the  clinic accompanied by his wife. ?He reports he is overall stable-- he notes his appetite and pain are "up and down." He tells me he doesn't need assistance when he walks at home, he just moves slowly. He denies fever/chills or cough. His wife notes he does stay cold most of the time. ?  ?All other systems were reviewed with the patient and are negative. ? ?MEDICAL HISTORY:  ?No past medical history on file. ? ?SURGICAL HISTORY: ?Past Surgical History:  ?Procedure Laterality Date  ? APPENDECTOMY    ? IR ANGIO INTRA EXTRACRAN SEL COM CAROTID INNOMINATE BILAT MOD SED  01/16/2022  ? IR ANGIO VERTEBRAL SEL SUBCLAVIAN INNOMINATE BILAT MOD SED  01/16/2022  ? IR CT HEAD LTD  01/18/2022  ? IR EMBO ART  VEN HEMORR LYMPH EXTRAV  INC GUIDE ROADMAPPING  08/30/2021  ? IR FLUORO GUIDE CV LINE RIGHT  08/30/2021  ? IR FLUORO GUIDE CV LINE RIGHT  09/18/2021  ? IR PERCUTANEOUS ART THROMBECTOMY/INFUSION INTRACRANIAL INC DIAG ANGIO  01/18/2022  ? IR RENAL SELECTIVE  UNI INC S&I MOD SED  08/31/2021  ? IR US GUIDE VASC ACCESS RIGHT  08/30/2021  ? IR US GUIDE VASC ACCESS RIGHT  08/30/2021  ? IR US GUIDE VASC ACCESS RIGHT  09/18/2021  ? IR US GUIDE VASC ACCESS RIGHT  01/16/2022  ? RADIOLOGY WITH ANESTHESIA N/A 01/18/2022  ? Procedure: Cerebral angioplasty with possible stenting;  Surgeon: Luanne Bras, MD;  Location: [place];  Service: Radiology;  Laterality: N/A;  ? ? ?I have reviewed the social history and family history with the patient and they are unchanged from previous note. ? ?ALLERGIES:  has No Known Allergies. ? ?MEDICATIONS:  ?Current Outpatient Medications  ?Medication Sig Dispense Refill  ? acetaminophen (TYLENOL) 325 MG tablet Take 2 tablets (650 mg total) by mouth every 6 (six) hours as needed for moderate pain (headache).    ? ALPRAZolam (XANAX) 0.5 MG tablet TAKE 1 TABLET BY MOUTH 2 TIMES DAILY AS NEEDED FOR ANXIETY OR SLEEP 60 tablet 0  ? aspirin 81 MG EC tablet Take 1 tablet (81 mg total) by mouth daily. Swallow whole. 30 tablet 11  ?  calcitRIOL (ROCALTROL) 0.25 MCG capsule Take 1 capsule (0.25 mcg total) by mouth every Monday, Wednesday, and Friday with hemodialysis. 30 capsule 1  ? cyclophosphamide (CYTOXAN) 50 MG capsule Take 9 capsules (450 mg total) by mouth once a week. Take with food to minimize GI upset. Take early in the day and maintain hydration. 36 capsule 1  ? folic acid (FOLVITE) 1 MG tablet TAKE 2 TABLETS BY MOUTH EVERY DAY 60 tablet 0  ? Melatonin 10 MG TABS Take 10 mg by mouth at bedtime.    ? metoprolol succinate (TOPROL-XL) 25 MG 24 hr tablet TAKE 1/2 TABLET BY MOUTH EVERY DAY 15 tablet 4  ? midodrine (PROAMATINE) 5 MG tablet Take 1 tablet (5 mg total) by mouth 3 (three) times daily with meals. 270 tablet 3  ? mirtazapine (REMERON) 30 MG tablet Take 1 tablet (30 mg total) by mouth at bedtime. 30 tablet 1  ? ondansetron (ZOFRAN) 4 MG tablet Take 1 tablet (4 mg total) by mouth every 8 (eight) hours as needed for nausea or vomiting. 45 tablet 2  ? oxyCODONE (OXY IR/ROXICODONE) 5  MG immediate release tablet Take 1-2 tablets (5-10 mg total) by mouth every 4 (four) hours as needed for severe pain or moderate pain. 90 tablet 0  ? pantoprazole (PROTONIX) 40 MG tablet TAKE 1 TABLET BY MOUTH 2 TIMES DAILY BEFORE A MEAL 60 tablet 0  ? prochlorperazine (COMPAZINE) 5 MG tablet Take 1 tablet (5 mg total) by mouth every 6 (six) hours as needed for nausea or vomiting. 30 tablet 2  ? senna-docusate (SENNA S) 8.6-50 MG tablet Take 1 tablet by mouth daily. 30 tablet 6  ? sevelamer carbonate (RENVELA) 800 MG tablet Take 800 mg by mouth 3 (three) times daily with meals.    ? tamsulosin (FLOMAX) 0.4 MG CAPS capsule TAKE 1 CAPSULE BY MOUTH EVERY DAY 30 capsule 0  ? ticagrelor (BRILINTA) 90 MG TABS tablet Take 1 tablet (90 mg total) by mouth 2 (two) times daily. 60 tablet 0  ? zolpidem (AMBIEN) 5 MG tablet Take 1 tablet (5 mg total) by mouth at bedtime as needed for sleep. 30 tablet 1  ? ?No current facility-administered medications for this visit.   ? ?Facility-Administered Medications Ordered in Other Visits  ?Medication Dose Route Frequency Provider Last Rate Last Admin  ? heparin lock flush 100 unit/mL  500 Units Intracatheter Once Truitt Merle, MD

## 2022-04-25 NOTE — Progress Notes (Signed)
Patient has completed doses of PO Cytoxan. ? ?Per written treatment plan, ok to proceed with creat 3.37 mg/dL ?

## 2022-04-25 NOTE — Patient Instructions (Signed)
[facility] MEDICAL [facility]COLOG[facility]  Discharge Instructions: Thank you for choosing [facility] to provide your oncology and hematology care.   If you have a lab appointment with the Cancer Center, please go directly to the Cancer Center and check in at the registration area.   Wear comfortable clothing and clothing appropriate for easy access to any Portacath or PICC line.   We strive to give you quality time with your provider. You may need to reschedule your appointment if you arrive late (15 or more minutes).  Arriving late affects you and other patients whose appointments are after yours.  Also, if you miss three or more appointments without notifying the office, you may be dismissed from the clinic at the provider's discretion.      For prescription refill requests, have your pharmacy contact our office and allow 72 hours for refills to be completed.    Today you received the following chemotherapy and/or immunotherapy agent: Darzalex      To help prevent nausea and vomiting after your treatment, we encourage you to take your nausea medication as directed.  BELOW ARE SYMPTOMS THAT SHOULD BE REPORTED IMMEDIATELY: *FEVER GREATER THAN 100.4 F (38 C) OR HIGHER *CHILLS OR SWEATING *NAUSEA AND VOMITING THAT IS NOT CONTROLLED WITH YOUR NAUSEA MEDICATION *UNUSUAL SHORTNESS OF BREATH *UNUSUAL BRUISING OR BLEEDING *URINARY PROBLEMS (pain or burning when urinating, or frequent urination) *BOWEL PROBLEMS (unusual diarrhea, constipation, pain near the anus) TENDERNESS IN MOUTH AND THROAT WITH OR WITHOUT PRESENCE OF ULCERS (sore throat, sores in mouth, or a toothache) UNUSUAL RASH, SWELLING OR PAIN  UNUSUAL VAGINAL DISCHARGE OR ITCHING   Items with * indicate a potential emergency and should be followed up as soon as possible or go to the Emergency Department if any problems should occur.  Please show the CHEMOTHERAPY ALERT CARD or IMMUNOTHERAPY ALERT CARD at check-in to  the Emergency Department and triage nurse.  Should you have questions after your visit or need to cancel or reschedule your appointment, please contact [facility]  Dept: [phone]  and follow the prompts.  Office hours are 8:00 a.m. to 4:30 p.m. Monday - Friday. Please note that voicemails left after 4:00 p.m. may not be returned until the following business day.  We are closed weekends and major holidays. You have access to a nurse at all times for urgent questions. Please call the main number to the clinic Dept: [phone] and follow the prompts.   For any non-urgent questions, you may also contact your provider using MyChart. We now offer e-Visits for anyone 18 and older to request care online for non-urgent symptoms. For details visit [email or web address].   Also download the MyChart app! Go to the app store, search "MyChart", open the app, select [facility], and log in with your MyChart username and password.  Due to Covid, a mask is required upon entering the hospital/clinic. If you do not have a mask, one will be given to you upon arrival. For doctor visits, patients may have 1 support person aged 18 or older with them. For treatment visits, patients cannot have anyone with them due to current Covid guidelines and our immunocompromised population.   

## 2022-04-26 ENCOUNTER — Inpatient Hospital Stay: Payer: Commercial Managed Care - PPO

## 2022-04-26 ENCOUNTER — Other Ambulatory Visit: Payer: Self-pay | Admitting: Nurse Practitioner

## 2022-04-26 ENCOUNTER — Inpatient Hospital Stay: Payer: Commercial Managed Care - PPO | Admitting: Hematology

## 2022-04-26 DIAGNOSIS — E8581 Light chain (AL) amyloidosis: Secondary | ICD-10-CM

## 2022-04-26 LAB — KAPPA/LAMBDA LIGHT CHAINS
Kappa free light chain: 41.9 mg/L — ABNORMAL HIGH (ref 3.3–19.4)
Kappa, lambda light chain ratio: 1.96 — ABNORMAL HIGH (ref 0.26–1.65)
Lambda free light chains: 21.4 mg/L (ref 5.7–26.3)

## 2022-04-26 MED ORDER — OXYCODONE HCL 5 MG PO TABS
5.0000 mg | ORAL_TABLET | ORAL | 0 refills | Status: DC | PRN
Start: 2022-04-26 — End: 2022-05-16

## 2022-04-27 ENCOUNTER — Encounter: Payer: Self-pay | Admitting: Hematology

## 2022-04-29 ENCOUNTER — Inpatient Hospital Stay: Payer: Commercial Managed Care - PPO | Admitting: Nurse Practitioner

## 2022-04-29 LAB — MULTIPLE MYELOMA PANEL, SERUM
Albumin SerPl Elph-Mcnc: 3.1 g/dL (ref 2.9–4.4)
Albumin/Glob SerPl: 1.4 (ref 0.7–1.7)
Alpha 1: 0.3 g/dL (ref 0.0–0.4)
Alpha2 Glob SerPl Elph-Mcnc: 0.9 g/dL (ref 0.4–1.0)
B-Globulin SerPl Elph-Mcnc: 0.8 g/dL (ref 0.7–1.3)
Gamma Glob SerPl Elph-Mcnc: 0.3 g/dL — ABNORMAL LOW (ref 0.4–1.8)
Globulin, Total: 2.3 g/dL (ref 2.2–3.9)
IgA: 36 mg/dL — ABNORMAL LOW (ref 61–437)
IgG (Immunoglobin G), Serum: 288 mg/dL — ABNORMAL LOW (ref 603–1613)
IgM (Immunoglobulin M), Srm: 54 mg/dL (ref 15–143)
Total Protein ELP: 5.4 g/dL — ABNORMAL LOW (ref 6.0–8.5)

## 2022-04-30 ENCOUNTER — Other Ambulatory Visit (HOSPITAL_COMMUNITY): Payer: Self-pay | Admitting: Nephrology

## 2022-04-30 DIAGNOSIS — N186 End stage renal disease: Secondary | ICD-10-CM

## 2022-05-01 ENCOUNTER — Other Ambulatory Visit: Payer: Self-pay | Admitting: Student

## 2022-05-01 ENCOUNTER — Other Ambulatory Visit: Payer: Self-pay | Admitting: Radiology

## 2022-05-02 ENCOUNTER — Encounter (HOSPITAL_COMMUNITY): Payer: Self-pay

## 2022-05-02 ENCOUNTER — Other Ambulatory Visit (HOSPITAL_COMMUNITY): Payer: Self-pay | Admitting: Nephrology

## 2022-05-02 ENCOUNTER — Other Ambulatory Visit: Payer: Self-pay

## 2022-05-02 ENCOUNTER — Ambulatory Visit (HOSPITAL_COMMUNITY)
Admission: RE | Admit: 2022-05-02 | Discharge: 2022-05-02 | Disposition: A | Discharge status: Home / Self Care | Payer: Commercial Managed Care - PPO | Source: Ambulatory Visit | Attending: Nephrology | Admitting: Nephrology

## 2022-05-02 DIAGNOSIS — Y841 Kidney dialysis as the cause of abnormal reaction of the patient, or of later complication, without mention of misadventure at the time of the procedure: Secondary | ICD-10-CM | POA: Diagnosis not present

## 2022-05-02 DIAGNOSIS — N186 End stage renal disease: Secondary | ICD-10-CM | POA: Diagnosis not present

## 2022-05-02 DIAGNOSIS — T8241XA Breakdown (mechanical) of vascular dialysis catheter, initial encounter: Secondary | ICD-10-CM | POA: Diagnosis not present

## 2022-05-02 DIAGNOSIS — Z992 Dependence on renal dialysis: Secondary | ICD-10-CM | POA: Diagnosis not present

## 2022-05-02 HISTORY — PX: IR VENOCAVAGRAM SVC: IMG679

## 2022-05-02 HISTORY — PX: IR FLUORO GUIDE CV LINE RIGHT: IMG2283

## 2022-05-02 IMAGING — XA IR FLUORO GUIDE CV LINE*R*
2 series · 7 of 7 positions shown · non-contrast
Comparison: none

INDICATION: Poorly functioning hemodialysis catheter. Please perform
fluoroscopic guided exchange.

[Series 2: body 4 care · 2 acquisitions, 5 frames shown]
[im 1/2]
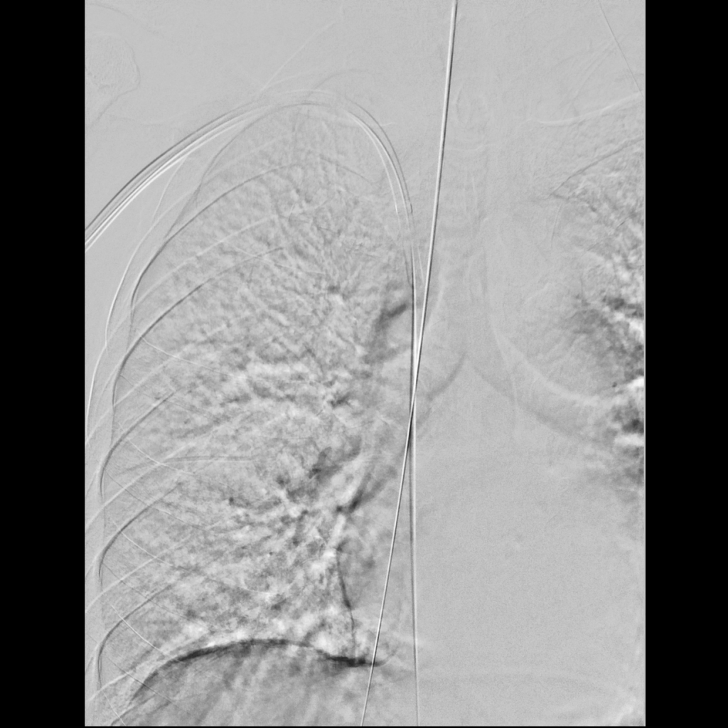
[im 1/2]
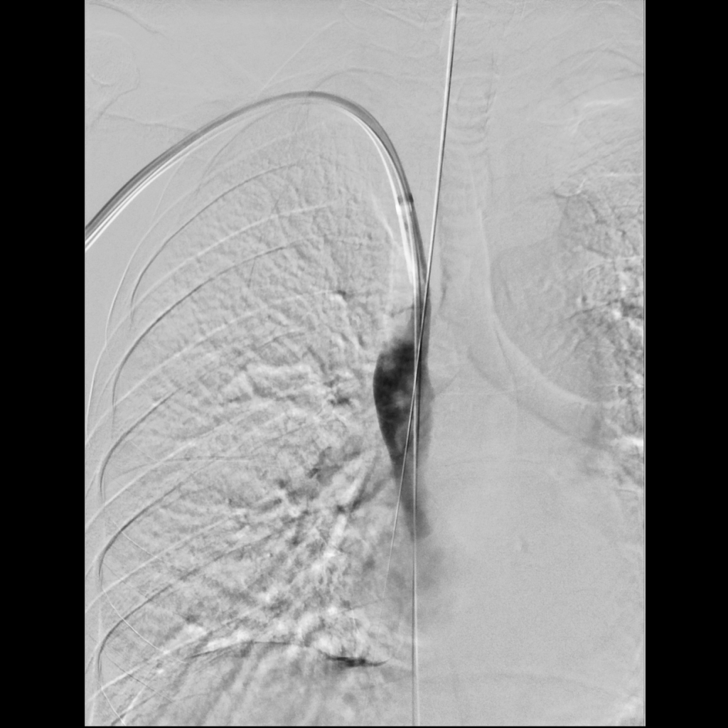
[im 1/2]
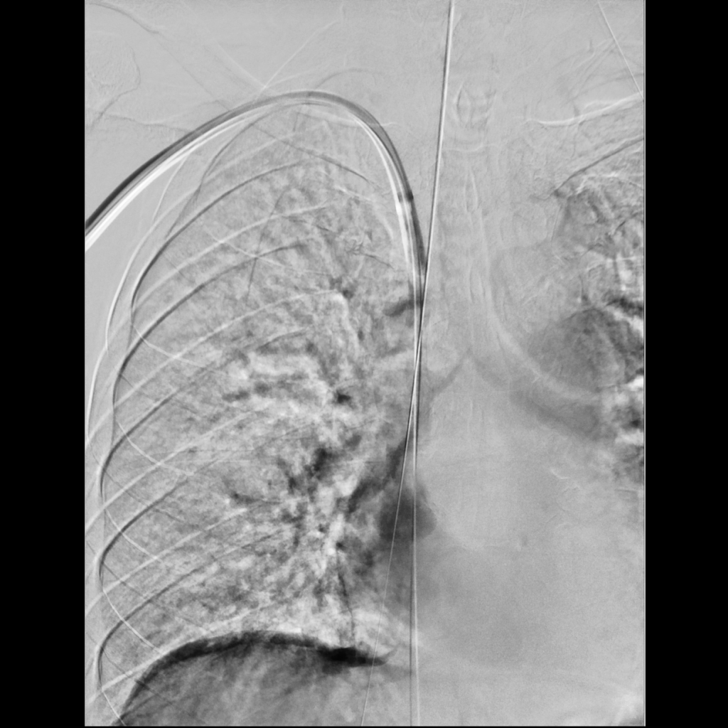
[im 1/2]
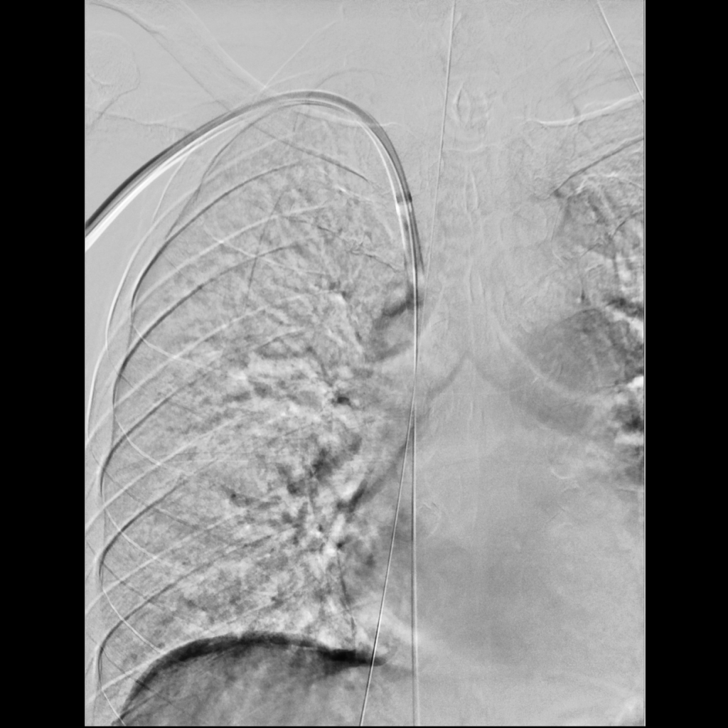
[im 2/2]
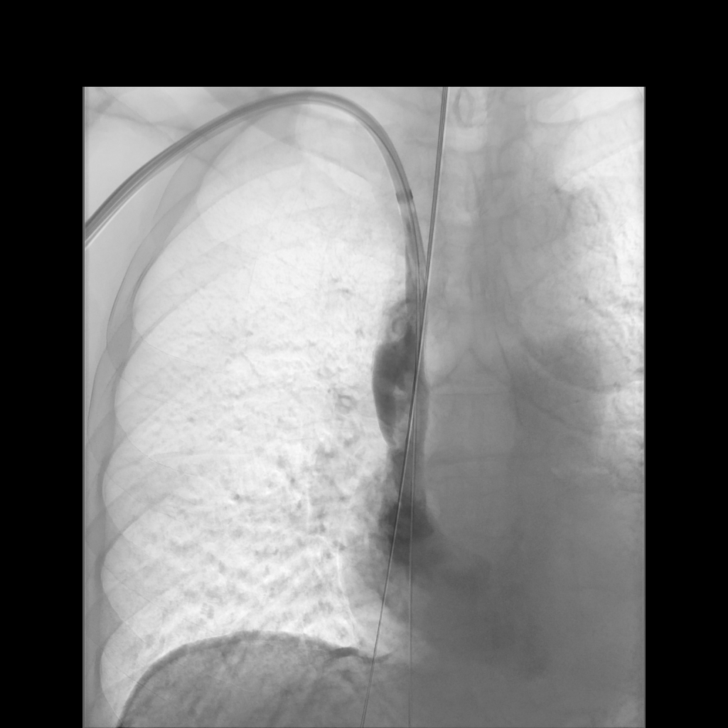

[Series 3: fl (-) angio · 2 of 2 slices shown]
[im 1/2]
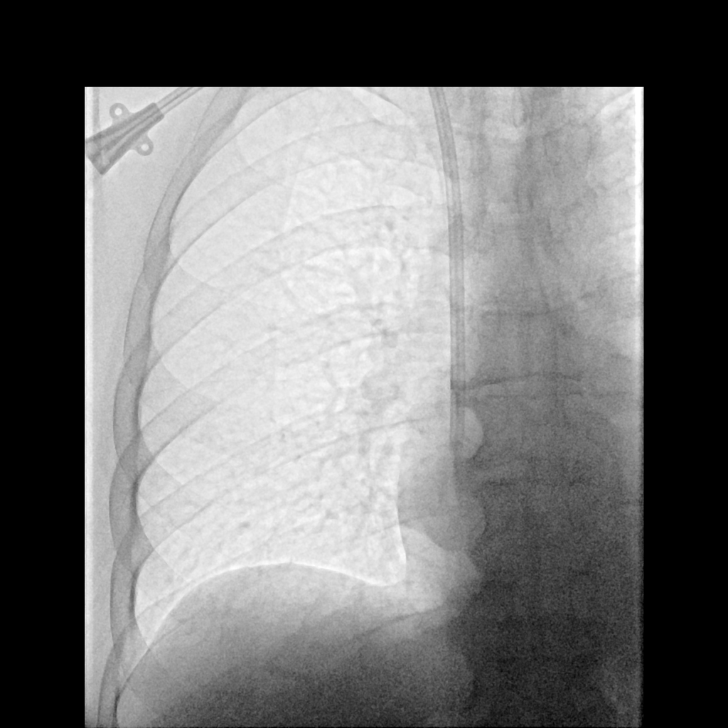
[im 2/2]
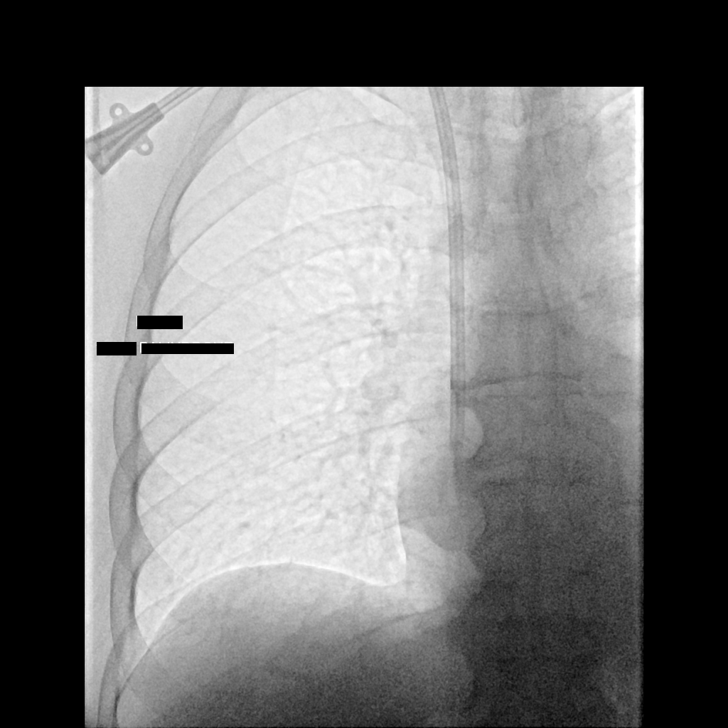

[7 of 7 positions shown; findings below may reference images not displayed]

EXAM:
TUNNELED CENTRAL VENOUS HEMODIALYSIS CATHETER REPLACEMENT WITH
FLUOROSCOPIC GUIDANCE

MEDICATIONS:
None

ANESTHESIA/SEDATION:
ANESTHESIA/SEDATION
Moderate (conscious) sedation was employed during this procedure as
administered by the [REDACTED].

A total of Versed 5 mg and Fentanyl 200 mcg was administered
intravenously.

Moderate Sedation Time: 12 minutes. The patient's level of
consciousness and vital signs were monitored continuously by
radiology nursing throughout the procedure under my direct
supervision.

FLUOROSCOPY TIME:  42 seconds (16 mGy).

CONTRAST:  10 cc Omnipaque 300

COMPLICATIONS:
None immediate.

PROCEDURE:
Informed written consent was obtained from the patient after a
discussion of the risks, benefits, and alternatives to treatment.
Questions regarding the procedure were encouraged and answered. The
skin and external portion of the existing hemodialysis catheter was
prepped with chlorhexidine in a sterile fashion, and a sterile drape
was applied covering the operative field. Maximum barrier sterile
technique with sterile gowns and gloves were used for the procedure.
A timeout was performed prior to the initiation of the procedure.

One lumen of the hemodialysis catheter was cannulated with a stiff
Glidewire advanced to the level of the IVC.

The hemodialysis catheter was retracted to the level of the superior
aspect of the SVC and a central venogram was performed.

Next, an additional stiff glidewire was advanced through the
catheter to the level of the IVC, peripheral to the nonocclusive
fibrin sheath involving the superior aspect of the SVC.

Under intermittent fluoroscopic guidance, the existing dialysis
catheter was exchanged for a new 19 cm tip to cuff Hemosplit
dialysis catheter with tips ultimately terminating within the
superior aspect of the right atrium. Final catheter positioning was
confirmed and documented with a spot radiographic image. The
catheter aspirates and flushes normally. The catheter was flushed
with appropriate volume heparin dwells.

The catheter exit site was secured with a 0-Prolene retention
sutures. Both lumens were heparinized. A dressing was applied. The
patient tolerated the procedure well without immediate post
procedural complication.
IMPRESSION: 1. Successful replacement of 19 cm tip to cuff tunneled hemodialysis
catheter with tips terminating within the right atrium. The catheter
is ready for immediate use.
2. Superior vena cavagram demonstrates a nonocclusive fibrin sheath
involving the superior aspect of the SVC. New dialysis catheter was
positioned external to this fibrin sheath with tip at the level of
the superior cavoatrial junction.

## 2022-05-02 MED ORDER — CEFAZOLIN SODIUM-DEXTROSE 2-4 GM/100ML-% IV SOLN: 2.0000 g | INTRAVENOUS | Status: AC

## 2022-05-02 MED ORDER — SODIUM CHLORIDE 0.9 % IV SOLN: INTRAVENOUS | Status: DC

## 2022-05-02 MED ORDER — MIDAZOLAM HCL 2 MG/2ML IJ SOLN
INTRAMUSCULAR | Status: DC | PRN
  Administered 2022-05-02 (×3): 1 mg via INTRAVENOUS
  Administered 2022-05-02: 2 mg via INTRAVENOUS

## 2022-05-02 MED ORDER — MIDAZOLAM HCL 2 MG/2ML IJ SOLN
INTRAMUSCULAR | Status: AC
Start: 2022-05-02 — End: 2022-05-02
  Filled 2022-05-02: qty 2

## 2022-05-02 MED ORDER — CHLORHEXIDINE GLUCONATE 4 % EX LIQD
CUTANEOUS | Status: AC
  Filled 2022-05-02: qty 15

## 2022-05-02 MED ORDER — MIDAZOLAM HCL 2 MG/2ML IJ SOLN
INTRAMUSCULAR | Status: AC
Start: 2022-05-02 — End: 2022-05-02
  Filled 2022-05-02: qty 4

## 2022-05-02 MED ORDER — FENTANYL CITRATE (PF) 100 MCG/2ML IJ SOLN
INTRAMUSCULAR | Status: DC | PRN
  Administered 2022-05-02: 100 ug via INTRAVENOUS
  Administered 2022-05-02 (×2): 50 ug via INTRAVENOUS

## 2022-05-02 MED ORDER — HEPARIN SODIUM (PORCINE) 1000 UNIT/ML IJ SOLN
INTRAMUSCULAR | Status: AC
  Filled 2022-05-02: qty 10

## 2022-05-02 MED ORDER — IOHEXOL 300 MG/ML  SOLN: 100.0000 mL | Freq: Once | INTRAMUSCULAR | Status: DC | PRN

## 2022-05-02 MED ORDER — HEPARIN SOD (PORK) LOCK FLUSH 100 UNIT/ML IV SOLN
INTRAVENOUS | Status: DC | PRN
  Administered 2022-05-02: 3200 [IU]

## 2022-05-02 MED ORDER — FENTANYL CITRATE (PF) 100 MCG/2ML IJ SOLN
INTRAMUSCULAR | Status: AC
  Filled 2022-05-02: qty 4

## 2022-05-02 MED ORDER — CEFAZOLIN SODIUM-DEXTROSE 2-4 GM/100ML-% IV SOLN
INTRAVENOUS | Status: AC
  Administered 2022-05-02: 2 g via INTRAVENOUS
  Filled 2022-05-02: qty 100

## 2022-05-02 MED ORDER — LIDOCAINE HCL 1 % IJ SOLN
INTRAMUSCULAR | Status: AC
Start: 2022-05-02 — End: 2022-05-02
  Filled 2022-05-02: qty 20

## 2022-05-02 NOTE — Patient Outreach (Signed)
[facility]) Care Management ? ?05/02/2022 ? ?Caleb Simmons ?01/31/1951 ?[id number] ? ? ?Second telephone outreach attempt to obtain mRS. No answer. Left message for returned call. ? ?[name] ?[facility]-Care Management Assistant ?[phone] ? ?

## 2022-05-02 NOTE — H&P (Signed)
? ?Chief Complaint: ?Patient was seen in consultation today for end stage renal disease ? ?Referring Physician(s): ?Sanford,Ryan B ? ?Supervising Physician: Sandi Mariscal ? ?Patient Status: [facility] - Out-pt ? ?History of Present Illness: ?Caleb Simmons is a 71 y.o. 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  He states that his catheter has intermittently had issues with flow and clots, however he is generally able to complete a full HD session.  His last HD was yesterday and he reports no issues, but states he was referred to IR so he can get a new catheter with sedation.  ? ?He presents today otherwise in his usual state of health.  He has been NPO.  He has transportation and support at home.  ? ?History reviewed. No pertinent past medical history. ? ?Past Surgical History:  ?Procedure Laterality Date  ? APPENDECTOMY    ? IR ANGIO INTRA EXTRACRAN SEL COM CAROTID INNOMINATE BILAT MOD SED  01/16/2022  ? IR ANGIO VERTEBRAL SEL SUBCLAVIAN INNOMINATE BILAT MOD SED  01/16/2022  ? IR CT HEAD LTD  01/18/2022  ? IR EMBO ART  VEN HEMORR LYMPH EXTRAV  INC GUIDE ROADMAPPING  08/30/2021  ? IR FLUORO GUIDE CV LINE RIGHT  08/30/2021  ? IR FLUORO GUIDE CV LINE RIGHT  09/18/2021  ? IR PERCUTANEOUS ART THROMBECTOMY/INFUSION INTRACRANIAL INC DIAG ANGIO  01/18/2022  ? IR RENAL SELECTIVE  UNI INC S&I MOD SED  08/31/2021  ? IR US GUIDE VASC ACCESS RIGHT  08/30/2021  ? IR US GUIDE VASC ACCESS RIGHT  08/30/2021  ? IR US GUIDE VASC ACCESS RIGHT  09/18/2021  ? IR US GUIDE VASC ACCESS RIGHT  01/16/2022  ? RADIOLOGY WITH ANESTHESIA N/A 01/18/2022  ? Procedure: Cerebral angioplasty with possible stenting;  Surgeon: Luanne Bras, MD;  Location: [facility];  Service: Radiology;  Laterality: N/A;  ? ? ?Allergies: ?Patient has no known  allergies. ? ?Medications: ?Prior to Admission medications   ?Medication Sig Start Date End Date Taking? Authorizing Provider  ?acetaminophen (TYLENOL) 325 MG tablet Take 2 tablets (650 mg total) by mouth every 6 (six) hours as needed for moderate pain (headache). 09/24/21   Domenic Polite, MD  ?ALPRAZolam Duanne Moron) 0.5 MG tablet TAKE 1 TABLET BY MOUTH 2 TIMES DAILY AS NEEDED FOR ANXIETY OR SLEEP 04/18/22   Pickenpack-Cousar, Carlena Sax, NP  ?aspirin 81 MG EC tablet Take 1 tablet (81 mg total) by mouth daily. Swallow whole. 01/20/22   Mercy Riding, MD  ?calcitRIOL (ROCALTROL) 0.25 MCG capsule Take 1 capsule (0.25 mcg total) by mouth every Monday, Wednesday, and Friday with hemodialysis. 01/21/22   Mercy Riding, MD  ?cyclophosphamide (CYTOXAN) 50 MG capsule Take 9 capsules (450 mg total) by mouth once a week. Take with food to minimize GI upset. Take early in the day and maintain hydration. 02/21/22   Truitt Merle, MD  ?folic acid (FOLVITE) 1 MG tablet TAKE 2 TABLETS BY MOUTH EVERY DAY 04/10/22   Truitt Merle, MD  ?Melatonin 10 MG TABS Take 10 mg by mouth at bedtime.    [provider]  ?metoprolol succinate (TOPROL-XL) 25 MG 24 hr tablet TAKE 1/2 TABLET BY MOUTH EVERY DAY 03/26/22   Larey Dresser, MD  ?midodrine (PROAMATINE) 5 MG tablet Take 1 tablet (5 mg total) by mouth 3 (three) times daily  with meals. 10/23/21   Larey Dresser, MD  ?mirtazapine (REMERON) 30 MG tablet Take 1 tablet (30 mg total) by mouth at bedtime. 03/26/22   Pickenpack-Cousar, Carlena Sax, NP  ?ondansetron (ZOFRAN) 4 MG tablet Take 1 tablet (4 mg total) by mouth every 8 (eight) hours as needed for nausea or vomiting. 03/28/22   Pickenpack-Cousar, Carlena Sax, NP  ?oxyCODONE (OXY IR/ROXICODONE) 5 MG immediate release tablet Take 1-2 tablets (5-10 mg total) by mouth every 4 (four) hours as needed for severe pain or moderate pain. 04/26/22   Pickenpack-Cousar, Carlena Sax, NP  ?pantoprazole (PROTONIX) 40 MG tablet TAKE 1 TABLET BY MOUTH 2 TIMES DAILY BEFORE A MEAL  04/10/22   Truitt Merle, MD  ?prochlorperazine (COMPAZINE) 5 MG tablet Take 1 tablet (5 mg total) by mouth every 6 (six) hours as needed for nausea or vomiting. 03/28/22   Pickenpack-Cousar, Carlena Sax, NP  ?senna-docusate (SENNA S) 8.6-50 MG tablet Take 1 tablet by mouth daily. 04/11/22   Pickenpack-Cousar, Carlena Sax, NP  ?sevelamer carbonate (RENVELA) 800 MG tablet Take 800 mg by mouth 3 (three) times daily with meals.    [provider]  ?tamsulosin (FLOMAX) 0.4 MG CAPS capsule TAKE 1 CAPSULE BY MOUTH EVERY DAY 04/10/22   Truitt Merle, MD  ?zolpidem (AMBIEN) 5 MG tablet Take 1 tablet (5 mg total) by mouth at bedtime as needed for sleep. 04/04/22   Truitt Merle, MD  ?  ? ?History reviewed. No pertinent family history. ? ?Social History  ? ?Socioeconomic History  ? Marital status: Married  ?  Spouse name: Not on file  ? Number of children: Not on file  ? Years of education: Not on file  ? Highest education level: Not on file  ?Occupational History  ? Not on file  ?Tobacco Use  ? Smoking status: Some Days  ?  Packs/day: 0.25  ?  Years: 25.00  ?  Pack years: 6.25  ?  Types: Cigarettes  ? Smokeless tobacco: Never  ?Substance and Sexual Activity  ? Alcohol use: Not on file  ? Drug use: Not on file  ? Sexual activity: Not on file  ?Other Topics Concern  ? Not on file  ?Social History Narrative  ? Not on file  ? ?Social Determinants of Health  ? ?Financial Resource Strain: Low Risk   ? Difficulty of Paying Living Expenses: Not very hard  ?Food Insecurity: No Food Insecurity  ? Worried About Charity fundraiser in the Last Year: Never true  ? Ran Out of Food in the Last Year: Never true  ?Transportation Needs: No Transportation Needs  ? Lack of Transportation (Medical): No  ? Lack of Transportation (Non-Medical): No  ?Physical Activity: Not on file  ?Stress: Not on file  ?Social Connections: Not on file  ? ? ? ?Review of Systems: A 12 point ROS discussed and pertinent positives are indicated in the HPI above.  All other systems  are negative. ? ?Review of Systems  ?Constitutional:  Negative for fatigue and fever.  ?Respiratory:  Negative for cough and shortness of breath.   ?Cardiovascular:  Negative for chest pain.  ?Gastrointestinal:  Negative for abdominal pain, nausea and vomiting.  ?Musculoskeletal:  Negative for back pain.  ?Psychiatric/Behavioral:  Negative for behavioral problems and confusion.   ? ?Vital Signs: ?BP (!) 156/88 (BP Location: Left Arm)   Pulse 91   Resp (!) 24   SpO2 95%  ? ?Physical Exam ?Vitals and nursing note reviewed.  ?Constitutional:   ?  General: He is not in acute distress. ?   Appearance: Normal appearance. He is not ill-appearing.  ?HENT:  ?   Mouth/Throat:  ?   Mouth: Mucous membranes are moist.  ?   Pharynx: Oropharynx is clear.  ?Cardiovascular:  ?   Rate and Rhythm: Normal rate and regular rhythm.  ?Pulmonary:  ?   Effort: Pulmonary effort is normal. No respiratory distress.  ?   Breath sounds: Normal breath sounds.  ?Skin: ?   General: Skin is warm and dry.  ?Neurological:  ?   General: No focal deficit present.  ?   Mental Status: He is alert and oriented to person, place, and time. Mental status is at baseline.  ?Psychiatric:     ?   Mood and Affect: Mood normal.     ?   Behavior: Behavior normal.     ?   Thought Content: Thought content normal.     ?   Judgment: Judgment normal.  ? ? ? ?MD Evaluation ?Airway: WNL ?Heart: WNL ?Abdomen: WNL ?Chest/ Lungs: WNL ?ASA  Classification: 3 ?Mallampati/Airway Score: Two ? ? ?Imaging: ?No results found. ? ?Labs: ? ?CBC: ?Recent Labs  ?  03/14/22 ?0741 03/21/22 ?7510 03/28/22 ?2585 04/25/22 ?2778  ?WBC 4.8 5.1 5.1 8.1  ?HGB 9.8* 9.8* 10.4* 11.9*  ?HCT 29.2* 30.4* 31.9* 36.9*  ?PLT 148* 145* 119* 148*  ? ? ?COAGS: ?Recent Labs  ?  08/29/21 ?2423 08/30/21 ?0241 01/14/22 ?1342 01/16/22 ?0355  ?INR 1.7* 1.5* 1.0 1.0  ?APTT  --   --  27  --   ? ? ?BMP: ?Recent Labs  ?  03/14/22 ?0741 03/21/22 ?5361 03/28/22 ?4431 04/25/22 ?5400  ?NA 137 137 136 137  ?K 4.2 4.1  4.3 3.8  ?CL 104 104 104 102  ?CO2 '26 27 25 27  '$ ?GLUCOSE 100* 105* 133* 117*  ?BUN '20 15 17 15  '$ ?CALCIUM 8.6* 8.7* 8.5* 8.8*  ?CREATININE 3.65* 3.37* 3.37* 3.37*  ?GFRNONAA 17* 19* 19* 19*  ? ? ?LIVER FUNCTION TESTS:

## 2022-05-02 NOTE — Progress Notes (Signed)
Pt arrived to radiology for HD catheter exchange. Pt states that he will not do procedure without sedation. Pt states that in the future, if this procedure is needed, he always needs to be scheduled with sedation medications. We are able to accommodate this pt needs today. Pt very appreciative. Pt is npo and has a driver. ?

## 2022-05-02 NOTE — Procedures (Signed)
Pre Procedural Dx: ESRD, poorly functioning HD catheter. ?Post Procedural Dx: Same ? ?Successful fluoroscopic guided exchange of existing right jugular approach dialysis catheter.   ?Catheter is ready for immediate use.   ? ?EBL: None ?No immediate post procedural complications.  ? ?Ronny Bacon, MD ?Pager #: [phone] ?  ?

## 2022-05-03 ENCOUNTER — Telehealth: Payer: Self-pay | Admitting: Hematology

## 2022-05-03 ENCOUNTER — Other Ambulatory Visit: Payer: Self-pay

## 2022-05-03 NOTE — Telephone Encounter (Signed)
Left message with follow-up appointments per 5/4 los. ?

## 2022-05-03 NOTE — Patient Outreach (Signed)
[facility]) Care Management ? ?05/03/2022 ? ?Caleb Simmons ?11/16/1951 ?[id number] ? ? ?3 outreach attempts were completed to obtain mRs. mRs could not be obtained because patient never returned my calls. mRs=7 ?  ? ?[name] ?Care Management Assistant ?[phone] ? ?

## 2022-05-06 ENCOUNTER — Other Ambulatory Visit: Payer: Self-pay

## 2022-05-06 MED ORDER — ZOLPIDEM TARTRATE 5 MG PO TABS: 5.0000 mg | ORAL_TABLET | Freq: Every evening | ORAL | 1 refills | Status: DC | PRN

## 2022-05-06 NOTE — Telephone Encounter (Signed)
Pt family member called requesting Ambien refill, see new orders. No further questions.  ?

## 2022-05-08 ENCOUNTER — Other Ambulatory Visit: Payer: Self-pay | Admitting: Hematology

## 2022-05-09 ENCOUNTER — Encounter: Payer: Self-pay | Admitting: Hematology

## 2022-05-16 ENCOUNTER — Other Ambulatory Visit: Payer: Self-pay | Admitting: Nurse Practitioner

## 2022-05-16 MED ORDER — OXYCODONE HCL 5 MG PO TABS: 5.0000 mg | ORAL_TABLET | ORAL | 0 refills | Status: DC | PRN

## 2022-05-16 MED ORDER — ALPRAZOLAM 0.5 MG PO TABS: ORAL_TABLET | ORAL | 0 refills | Status: DC

## 2022-05-17 ENCOUNTER — Encounter: Payer: Self-pay | Admitting: Hematology

## 2022-05-24 ENCOUNTER — Other Ambulatory Visit: Payer: Self-pay

## 2022-05-24 ENCOUNTER — Encounter: Payer: Self-pay | Admitting: Hematology

## 2022-05-24 ENCOUNTER — Encounter: Payer: Self-pay | Admitting: Nurse Practitioner

## 2022-05-24 ENCOUNTER — Inpatient Hospital Stay (HOSPITAL_BASED_OUTPATIENT_CLINIC_OR_DEPARTMENT_OTHER): Payer: Commercial Managed Care - PPO | Admitting: Nurse Practitioner

## 2022-05-24 ENCOUNTER — Inpatient Hospital Stay: Payer: Commercial Managed Care - PPO | Attending: Hematology

## 2022-05-24 ENCOUNTER — Inpatient Hospital Stay: Payer: Commercial Managed Care - PPO

## 2022-05-24 ENCOUNTER — Inpatient Hospital Stay (HOSPITAL_BASED_OUTPATIENT_CLINIC_OR_DEPARTMENT_OTHER): Payer: Commercial Managed Care - PPO | Admitting: Physician Assistant

## 2022-05-24 VITALS — BP 97/64 | HR 89 | Temp 99.0°F | Resp 16 | Wt 158.4 lb

## 2022-05-24 DIAGNOSIS — E8581 Light chain (AL) amyloidosis: Secondary | ICD-10-CM | POA: Diagnosis not present

## 2022-05-24 DIAGNOSIS — Z5112 Encounter for antineoplastic immunotherapy: Secondary | ICD-10-CM | POA: Insufficient documentation

## 2022-05-24 DIAGNOSIS — R634 Abnormal weight loss: Secondary | ICD-10-CM | POA: Diagnosis not present

## 2022-05-24 DIAGNOSIS — I951 Orthostatic hypotension: Secondary | ICD-10-CM | POA: Diagnosis not present

## 2022-05-24 DIAGNOSIS — Z992 Dependence on renal dialysis: Secondary | ICD-10-CM | POA: Diagnosis not present

## 2022-05-24 DIAGNOSIS — N186 End stage renal disease: Secondary | ICD-10-CM | POA: Insufficient documentation

## 2022-05-24 DIAGNOSIS — C9000 Multiple myeloma not having achieved remission: Secondary | ICD-10-CM | POA: Insufficient documentation

## 2022-05-24 DIAGNOSIS — E785 Hyperlipidemia, unspecified: Secondary | ICD-10-CM | POA: Diagnosis not present

## 2022-05-24 DIAGNOSIS — R7881 Bacteremia: Secondary | ICD-10-CM | POA: Diagnosis not present

## 2022-05-24 DIAGNOSIS — G893 Neoplasm related pain (acute) (chronic): Secondary | ICD-10-CM

## 2022-05-24 DIAGNOSIS — D638 Anemia in other chronic diseases classified elsewhere: Secondary | ICD-10-CM | POA: Insufficient documentation

## 2022-05-24 DIAGNOSIS — B951 Streptococcus, group B, as the cause of diseases classified elsewhere: Secondary | ICD-10-CM

## 2022-05-24 DIAGNOSIS — R630 Anorexia: Secondary | ICD-10-CM

## 2022-05-24 DIAGNOSIS — Z515 Encounter for palliative care: Secondary | ICD-10-CM | POA: Diagnosis not present

## 2022-05-24 DIAGNOSIS — I509 Heart failure, unspecified: Secondary | ICD-10-CM | POA: Insufficient documentation

## 2022-05-24 DIAGNOSIS — E538 Deficiency of other specified B group vitamins: Secondary | ICD-10-CM | POA: Insufficient documentation

## 2022-05-24 DIAGNOSIS — K5903 Drug induced constipation: Secondary | ICD-10-CM | POA: Diagnosis not present

## 2022-05-24 LAB — CBC WITH DIFFERENTIAL/PLATELET
Abs Immature Granulocytes: 0.02 10*3/uL (ref 0.00–0.07)
Basophils Absolute: 0.1 10*3/uL (ref 0.0–0.1)
Basophils Relative: 1 %
Eosinophils Absolute: 0.3 10*3/uL (ref 0.0–0.5)
Eosinophils Relative: 3 %
HCT: 36.4 % — ABNORMAL LOW (ref 39.0–52.0)
Hemoglobin: 12 g/dL — ABNORMAL LOW (ref 13.0–17.0)
Immature Granulocytes: 0 %
Lymphocytes Relative: 21 %
Lymphs Abs: 2.2 10*3/uL (ref 0.7–4.0)
MCH: 31.2 pg (ref 26.0–34.0)
MCHC: 33 g/dL (ref 30.0–36.0)
MCV: 94.5 fL (ref 80.0–100.0)
Monocytes Absolute: 0.9 10*3/uL (ref 0.1–1.0)
Monocytes Relative: 8 %
Neutro Abs: 7 10*3/uL (ref 1.7–7.7)
Neutrophils Relative %: 67 %
Platelets: 169 10*3/uL (ref 150–400)
RBC: 3.85 MIL/uL — ABNORMAL LOW (ref 4.22–5.81)
RDW: 14.4 % (ref 11.5–15.5)
WBC: 10.5 10*3/uL (ref 4.0–10.5)
nRBC: 0 % (ref 0.0–0.2)

## 2022-05-24 LAB — COMPREHENSIVE METABOLIC PANEL
ALT: 5 U/L (ref 0–44)
AST: 9 U/L — ABNORMAL LOW (ref 15–41)
Albumin: 3.5 g/dL (ref 3.5–5.0)
Alkaline Phosphatase: 86 U/L (ref 38–126)
Anion gap: 7 (ref 5–15)
BUN: 25 mg/dL — ABNORMAL HIGH (ref 8–23)
CO2: 25 mmol/L (ref 22–32)
Calcium: 9.2 mg/dL (ref 8.9–10.3)
Chloride: 104 mmol/L (ref 98–111)
Creatinine, Ser: 4.15 mg/dL (ref 0.61–1.24)
GFR, Estimated: 15 mL/min — ABNORMAL LOW (ref >60)
Glucose, Bld: 162 mg/dL — ABNORMAL HIGH (ref 70–99)
Potassium: 4.1 mmol/L (ref 3.5–5.1)
Sodium: 136 mmol/L (ref 135–145)
Total Bilirubin: 0.3 mg/dL (ref 0.3–1.2)
Total Protein: 5.8 g/dL — ABNORMAL LOW (ref 6.5–8.1)

## 2022-05-24 MED ORDER — DEXAMETHASONE 4 MG PO TABS
20.0000 mg | ORAL_TABLET | Freq: Once | ORAL | Status: AC
  Administered 2022-05-24: 20 mg via ORAL
  Filled 2022-05-24: qty 5

## 2022-05-24 MED ORDER — MIRTAZAPINE 30 MG PO TABS: 30.0000 mg | ORAL_TABLET | Freq: Every day | ORAL | 1 refills | Status: DC

## 2022-05-24 MED ORDER — DARATUMUMAB-HYALURONIDASE-FIHJ 1800-30000 MG-UT/15ML ~~LOC~~ SOLN
1800.0000 mg | Freq: Once | SUBCUTANEOUS | Status: AC
  Administered 2022-05-24: 1800 mg via SUBCUTANEOUS
  Filled 2022-05-24: qty 15

## 2022-05-24 MED ORDER — DIPHENHYDRAMINE HCL 25 MG PO CAPS
50.0000 mg | ORAL_CAPSULE | Freq: Once | ORAL | Status: AC
  Administered 2022-05-24: 50 mg via ORAL
  Filled 2022-05-24: qty 2

## 2022-05-24 MED ORDER — ACETAMINOPHEN 325 MG PO TABS
650.0000 mg | ORAL_TABLET | Freq: Once | ORAL | Status: AC
  Administered 2022-05-24: 650 mg via ORAL
  Filled 2022-05-24: qty 2

## 2022-05-24 NOTE — Progress Notes (Signed)
[facility]  Telephone:([phone]) [phone] Fax:([phone]) [phone]   Name: Caleb Simmons Date: 05/24/2022 MRN: [id number]  DOB: 05-29-1951  Patient Care Team: Pcp, No as PCP - General Buntin, Lavonda Jumbo, RN (Inactive) as Registered Nurse Pickenpack-Cousar, Carlena Sax, NP as Nurse Practitioner (Nurse Practitioner)    INTERVAL HISTORY: Caleb Simmons is a 71 y.o. 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  Recently admitted and discharged on 01/20/22 after receiving treatment for left MCA CVA. Palliative following for ongoing goals of care discussions and symptom management.   SOCIAL HISTORY:     reports that he has been smoking cigarettes. He has a 6.25 pack-year smoking history. He has never used smokeless tobacco.  ADVANCE DIRECTIVES:  Patient reports completed document.  Son Caleb, Simmons. is his healthcare power of attorney.  CODE STATUS: Full code  PAST MEDICAL HISTORY:History reviewed. No pertinent past medical history.  ALLERGIES:  has No Known Allergies.  MEDICATIONS:  Current Outpatient Medications  Medication Sig Dispense Refill   acetaminophen (TYLENOL) 325 MG tablet Take 2 tablets (650 mg total) by mouth every 6 (six) hours as needed for moderate pain (headache).     ALPRAZolam (XANAX) 0.5 MG tablet TAKE 1 TABLET BY MOUTH 2 TIMES DAILY AS NEEDED FOR ANXIETY OR SLEEP 60 tablet 0   aspirin 81 MG EC tablet Take 1 tablet (81 mg total) by mouth daily. Swallow whole. 30 tablet 11   folic acid (FOLVITE) 1 MG tablet TAKE 2 TABLETS BY MOUTH EVERY DAY 60 tablet 0   Melatonin 10 MG TABS Take 10 mg by mouth at bedtime.     metoprolol succinate (TOPROL-XL) 25 MG 24 hr tablet TAKE 1/2 TABLET BY MOUTH EVERY DAY 15 tablet 4   midodrine (PROAMATINE) 5 MG tablet Take 1 tablet (5 mg total) by mouth 3 (three) times daily with meals. 270  tablet 3   mirtazapine (REMERON) 30 MG tablet Take 1 tablet (30 mg total) by mouth at bedtime. 30 tablet 1   ondansetron (ZOFRAN) 4 MG tablet Take 1 tablet (4 mg total) by mouth every 8 (eight) hours as needed for nausea or vomiting. 45 tablet 2   oxyCODONE (OXY IR/ROXICODONE) 5 MG immediate release tablet Take 1-2 tablets (5-10 mg total) by mouth every 4 (four) hours as needed for severe pain or moderate pain. 90 tablet 0   pantoprazole (PROTONIX) 40 MG tablet TAKE 1 TABLET BY MOUTH 2 TIMES DAILY BEFORE A MEAL 60 tablet 0   prochlorperazine (COMPAZINE) 5 MG tablet Take 1 tablet (5 mg total) by mouth every 6 (six) hours as needed for nausea or vomiting. 30 tablet 2   senna-docusate (SENNA S) 8.6-50 MG tablet Take 1 tablet by mouth daily. 30 tablet 6   tamsulosin (FLOMAX) 0.4 MG CAPS capsule TAKE 1 CAPSULE BY MOUTH EVERY DAY 30 capsule 0   zolpidem (AMBIEN) 5 MG tablet Take 1 tablet (5 mg total) by mouth at bedtime as needed for sleep. 30 tablet 1   No current facility-administered medications for this visit.   Facility-Administered Medications Ordered in Other Visits  Medication Dose Route Frequency Provider Last Rate Last Admin   heparin lock flush 100 unit/mL  500 Units Intracatheter Once Truitt Merle, MD       sodium chloride flush (NS) 0.9 % injection 10 mL  10 mL Intracatheter Once Truitt Merle, MD  VITAL SIGNS: There were no vitals taken for this visit. There were no vitals filed for this visit.    Estimated body mass index is 22.73 kg/m as calculated from the following:   Height as of 04/25/22: '5\' 10"'$  (1.778 m).   Weight as of an earlier encounter on 05/24/22: 158 lb 6.4 oz (71.8 kg).   PERFORMANCE STATUS (ECOG) : 2 - Symptomatic, <50% confined to bed   Physical Exam General: NAD, in wheelchair Cardiovascular: RRR Pulmonary: clear ant fields, right chest dialysis cath Abdomen: soft, nontender, + bowel sounds Extremities: no edema, no joint deformities Neurological: AAO x4,  mood appropriate   IMPRESSION:  Caleb Simmons presents to clinic today for follow-up. Caleb Simmons is with him. He is in a wheelchair. Rubbing thighs and knees. Reports pain is controlled when taking pain medication. Is trying to be as active as possible. Shares he is doing well with dialysis and they have decreased his treatments to twice/week. He is appreciative of this.   Pain Caleb Simmons reports pain is managed with his current Oxy IR dose around the clock. We discussed possibility of starting a more long-acting regimen in the near future. He and Caleb Simmons in agreement if it comes to that. Will continue Oxy IR every 4-5 hrs as needed with close monitoring. Also some concern with ongoing hypotension.    Decreased Appetite Tolerating Remeron 30 mg. Caleb Simmons shares his appetite is up and down. He feels that the Remeron is helping some. We discussed making adjustments or considering another medication to gain better results. He would like to continue with current regimen for now. Continues to express his appetite decreases close to his treatment days and then escalates the day of treatment and for days after. Weight is has trending down since previous visit. Current weight is 158 lbs down from 162.4lb, 164lbs 4/6 on 3/30, 160lbs on 3/2. Will continue to monitor closely.   Constipation Is actively taking Miralax daily for bowel regimen with Senna. Improving.     PLAN: Oxy IR 5-10 mg every 4-6 hours. Will continue to monitor closely. If pain continues will consider adding a long-acting regimen. Some concerns with hypotension.   Will closely monitor appetite and make adjustments if no increase or further weight decline. Education provided on small frequent meals. Careful considerations given he is an active dialysis patient. Recommendations for Neproshakes for added nourishment. Currently tolerating Remeron. Weight trending down.   Ativan as needed for anxiety/sleep Miralax twice daily Senna-S daily Zofran as needed for  nausea.  I will plan to see him back in 3-4 weeks in collaboration with his other oncology appointments. I will also plan to give him a call on Monday to assess current symptoms and pain.    Patient expressed understanding and was in agreement with this plan. He also understands that He can call the clinic at any time with any questions, concerns, or complaints.   Number and complexity of problems addressed: 3 HIGH - 1 or more chronic illnesses with SEVERE exacerbation, progression, or side effects of treatment - advanced cancer, pain. Any controlled substances utilized were prescribed in the context of palliative care.   Time Total: 35 min   Visit consisted of counseling and education dealing with the complex and emotionally intense issues of symptom management and palliative care in the setting of serious and potentially life-threatening illness.Greater than 50%  of this time was spent counseling and coordinating care related to the above assessment and plan.  Alda Lea, AGPCNP-BC  Palliative Medicine Team/[place]

## 2022-05-24 NOTE — Patient Instructions (Signed)
[facility]  Discharge Instructions: Thank you for choosing [facility] to provide your oncology and hematology care.   If you have a lab appointment with the Cancer Center, please go directly to the Cancer Center and check in at the registration area.   Wear comfortable clothing and clothing appropriate for easy access to any Portacath or PICC line.   We strive to give you quality time with your provider. You may need to reschedule your appointment if you arrive late (15 or more minutes).  Arriving late affects you and other patients whose appointments are after yours.  Also, if you miss three or more appointments without notifying the office, you may be dismissed from the clinic at the provider's discretion.      For prescription refill requests, have your pharmacy contact our office and allow 72 hours for refills to be completed.    Today you received the following chemotherapy and/or immunotherapy agent: Darzalex      To help prevent nausea and vomiting after your treatment, we encourage you to take your nausea medication as directed.  BELOW ARE SYMPTOMS THAT SHOULD BE REPORTED IMMEDIATELY: *FEVER GREATER THAN 100.4 F (38 C) OR HIGHER *CHILLS OR SWEATING *NAUSEA AND VOMITING THAT IS NOT CONTROLLED WITH YOUR NAUSEA MEDICATION *UNUSUAL SHORTNESS OF BREATH *UNUSUAL BRUISING OR BLEEDING *URINARY PROBLEMS (pain or burning when urinating, or frequent urination) *BOWEL PROBLEMS (unusual diarrhea, constipation, pain near the anus) TENDERNESS IN MOUTH AND THROAT WITH OR WITHOUT PRESENCE OF ULCERS (sore throat, sores in mouth, or a toothache) UNUSUAL RASH, SWELLING OR PAIN  UNUSUAL VAGINAL DISCHARGE OR ITCHING   Items with * indicate a potential emergency and should be followed up as soon as possible or go to the Emergency Department if any problems should occur.  Please show the CHEMOTHERAPY ALERT CARD or IMMUNOTHERAPY ALERT CARD at check-in to  the Emergency Department and triage nurse.  Should you have questions after your visit or need to cancel or reschedule your appointment, please contact [facility]  Dept: [phone]  and follow the prompts.  Office hours are 8:00 a.m. to 4:30 p.m. Monday - Friday. Please note that voicemails left after 4:00 p.m. may not be returned until the following business day.  We are closed weekends and major holidays. You have access to a nurse at all times for urgent questions. Please call the main number to the clinic Dept: [phone] and follow the prompts.   For any non-urgent questions, you may also contact your provider using MyChart. We now offer e-Visits for anyone 18 and older to request care online for non-urgent symptoms. For details visit [email or web address].   Also download the MyChart app! Go to the app store, search "MyChart", open the app, select [facility], and log in with your MyChart username and password.  Due to Covid, a mask is required upon entering the hospital/clinic. If you do not have a mask, one will be given to you upon arrival. For doctor visits, patients may have 1 support person aged 18 or older with them. For treatment visits, patients cannot have anyone with them due to current Covid guidelines and our immunocompromised population.   

## 2022-05-24 NOTE — Progress Notes (Signed)
[facility]   Telephone:([phone]) [phone] Fax:([phone]) [phone]   Clinic Follow up Note   Patient Care Team: Pcp, No as PCP - General Buntin, Lavonda Jumbo, RN (Inactive) as Registered Nurse Chidester, Carlena Sax, NP as Nurse Practitioner (Nurse Practitioner)  Date of Service:  05/24/2022  CHIEF COMPLAINT: f/u of multiorgan AL amyloidosis  CURRENT THERAPY:  Daratumumab injection started 09/27/21, now on monthly maintenance with dexa   INTERVAL HISTORY:  ERLAND VIVAS is here for a follow up of amyloidosis. He was last seen by Dr. Burr Medico on 04/25/2022. He presents to the clinic accompanied by his wife.  Mr. Gaus reports that his energy levels are fairly stable. He has intermittent episodes of fatigue. He uses a wheelchair for long distances but doesn't need assistance when he walks at home. He reports his appetite is fairly stable without any weight loss. He denies nausea, vomiting or abdominal pain.  His bowel habits are unchanged without any diarrhea or constipation.  He denies easy bruising or signs of active bleeding.  He denies fevers, chills, night sweats, shortness of breath, chest pain or cough.  Patient reports that his nephrologist is planning to start twice weekly dialysis in the near future.  No other complaints.  Rest of the 10 point ROS was reviewed and negative.  MEDICAL HISTORY:  No past medical history on file.  SURGICAL HISTORY: Past Surgical History:  Procedure Laterality Date   APPENDECTOMY     IR ANGIO INTRA EXTRACRAN SEL COM CAROTID INNOMINATE BILAT MOD SED  01/16/2022   IR ANGIO VERTEBRAL SEL SUBCLAVIAN INNOMINATE BILAT MOD SED  01/16/2022   IR CT HEAD LTD  01/18/2022   IR EMBO ART  VEN HEMORR LYMPH EXTRAV  INC GUIDE ROADMAPPING  08/30/2021   IR FLUORO GUIDE CV LINE RIGHT  08/30/2021   IR FLUORO GUIDE CV LINE RIGHT  09/18/2021   IR FLUORO GUIDE CV LINE RIGHT  05/02/2022   IR PERCUTANEOUS ART THROMBECTOMY/INFUSION INTRACRANIAL INC DIAG ANGIO  01/18/2022   IR RENAL  SELECTIVE  UNI INC S&I MOD SED  08/31/2021   IR US GUIDE VASC ACCESS RIGHT  08/30/2021   IR US GUIDE VASC ACCESS RIGHT  08/30/2021   IR US GUIDE VASC ACCESS RIGHT  09/18/2021   IR US GUIDE VASC ACCESS RIGHT  01/16/2022   IR VENOCAVAGRAM SVC  05/02/2022   RADIOLOGY WITH ANESTHESIA N/A 01/18/2022   Procedure: Cerebral angioplasty with possible stenting;  Surgeon: Luanne Bras, MD;  Location: [facility];  Service: Radiology;  Laterality: N/A;    I have reviewed the social history and family history with the patient and they are unchanged from previous note.  ALLERGIES:  has No Known Allergies.  MEDICATIONS:  Current Outpatient Medications  Medication Sig Dispense Refill   acetaminophen (TYLENOL) 325 MG tablet Take 2 tablets (650 mg total) by mouth every 6 (six) hours as needed for moderate pain (headache).     ALPRAZolam (XANAX) 0.5 MG tablet TAKE 1 TABLET BY MOUTH 2 TIMES DAILY AS NEEDED FOR ANXIETY OR SLEEP 60 tablet 0   aspirin 81 MG EC tablet Take 1 tablet (81 mg total) by mouth daily. Swallow whole. 30 tablet 11   folic acid (FOLVITE) 1 MG tablet TAKE 2 TABLETS BY MOUTH EVERY DAY 60 tablet 0   Melatonin 10 MG TABS Take 10 mg by mouth at bedtime.     metoprolol succinate (TOPROL-XL) 25 MG 24 hr tablet TAKE 1/2 TABLET BY MOUTH EVERY DAY 15 tablet 4  midodrine (PROAMATINE) 5 MG tablet Take 1 tablet (5 mg total) by mouth 3 (three) times daily with meals. 270 tablet 3   mirtazapine (REMERON) 30 MG tablet Take 1 tablet (30 mg total) by mouth at bedtime. 30 tablet 1   ondansetron (ZOFRAN) 4 MG tablet Take 1 tablet (4 mg total) by mouth every 8 (eight) hours as needed for nausea or vomiting. 45 tablet 2   oxyCODONE (OXY IR/ROXICODONE) 5 MG immediate release tablet Take 1-2 tablets (5-10 mg total) by mouth every 4 (four) hours as needed for severe pain or moderate pain. 90 tablet 0   pantoprazole (PROTONIX) 40 MG tablet TAKE 1 TABLET BY MOUTH 2 TIMES DAILY BEFORE A MEAL 60 tablet 0   prochlorperazine  (COMPAZINE) 5 MG tablet Take 1 tablet (5 mg total) by mouth every 6 (six) hours as needed for nausea or vomiting. 30 tablet 2   senna-docusate (SENNA S) 8.6-50 MG tablet Take 1 tablet by mouth daily. 30 tablet 6   tamsulosin (FLOMAX) 0.4 MG CAPS capsule TAKE 1 CAPSULE BY MOUTH EVERY DAY 30 capsule 0   zolpidem (AMBIEN) 5 MG tablet Take 1 tablet (5 mg total) by mouth at bedtime as needed for sleep. 30 tablet 1   calcitRIOL (ROCALTROL) 0.25 MCG capsule Take 1 capsule (0.25 mcg total) by mouth every Monday, Wednesday, and Friday with hemodialysis. (Patient not taking: Reported on 05/24/2022) 30 capsule 1   cyclophosphamide (CYTOXAN) 50 MG capsule Take 9 capsules (450 mg total) by mouth once a week. Take with food to minimize GI upset. Take early in the day and maintain hydration. 36 capsule 1   sevelamer carbonate (RENVELA) 800 MG tablet Take 800 mg by mouth 3 (three) times daily with meals. (Patient not taking: Reported on 05/24/2022)     No current facility-administered medications for this visit.   Facility-Administered Medications Ordered in Other Visits  Medication Dose Route Frequency Provider Last Rate Last Admin   heparin lock flush 100 unit/mL  500 Units Intracatheter Once Truitt Merle, MD       sodium chloride flush (NS) 0.9 % injection 10 mL  10 mL Intracatheter Once Truitt Merle, MD        PHYSICAL EXAMINATION: ECOG PERFORMANCE STATUS: 2 - Symptomatic, <50% confined to bed  Vitals:   05/24/22 0847  BP: 97/64  Pulse: 89  Resp: 16  Temp: 99 F (37.2 C)  SpO2: 98%   Wt Readings from Last 3 Encounters:  05/24/22 158 lb 6.4 oz (71.8 kg)  04/25/22 158 lb 8 oz (71.9 kg)  03/28/22 162 lb 4.8 oz (73.6 kg)     GENERAL:alert, no distress and comfortable SKIN: skin color normal, no rashes or significant lesions EYES: normal, Conjunctiva are pink and non-injected, sclera clear  HEART: no lower extremity edema NEURO: alert & oriented x 3 with fluent speech  LABORATORY DATA:  I have reviewed  the data as listed    Latest Ref Rng & Units 05/24/2022    8:08 AM 04/25/2022    8:27 AM 03/28/2022    8:28 AM  CBC  WBC 4.0 - 10.5 K/uL 10.5   8.1   5.1    Hemoglobin 13.0 - 17.0 g/dL 12.0   11.9   10.4    Hematocrit 39.0 - 52.0 % 36.4   36.9   31.9    Platelets 150 - 400 K/uL 169   148   119          Latest Ref Rng & Units  04/25/2022    8:27 AM 03/28/2022    8:28 AM 03/21/2022    8:16 AM  CMP  Glucose 70 - 99 mg/dL 117   133   105    BUN 8 - 23 mg/dL 15   17   15     Creatinine 0.61 - 1.24 mg/dL 3.37   3.37   3.37    Sodium 135 - 145 mmol/L 137   136   137    Potassium 3.5 - 5.1 mmol/L 3.8   4.3   4.1    Chloride 98 - 111 mmol/L 102   104   104    CO2 22 - 32 mmol/L 27   25   27     Calcium 8.9 - 10.3 mg/dL 8.8   8.5   8.7    Total Protein 6.5 - 8.1 g/dL 5.7   5.6   5.4    Total Bilirubin 0.3 - 1.2 mg/dL 0.3   0.3   0.2    Alkaline Phos 38 - 126 U/L 95   92   90    AST 15 - 41 U/L 10   11   12     ALT 0 - 44 U/L 6   8   9         RADIOGRAPHIC STUDIES: I have personally reviewed the radiological images as listed and agreed with the findings in the report. No results found.    ASSESSMENT & PLAN:  KORREY SCHLEICHER is a 71 y.o. male with   1. AL amyloidosis with renal, cardiac and neuro involvement  -initially presented with weakness and SOB; lab work up showed BUN of 44, Cr 6.5, significant proteinuria/RBC on UA. Right renal biopsy on 08/28/21 revealing AL amyloidosis, lambda light chain composition. 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 Both serum and urine lamda Free light chains significantly elevated. -Due to the multiorgan involvement, with significant CHF (EF 45-50%) and renal failure, his prognosis is very poor. -He began weekly oral Cytoxan, dexa and Velcade injection while inpatient on 09/10/21.  He tolerated well. -He began daratumumab injection 09/27/21. -his light chain levels showed improvement after starting treatment, now overall stable. -not a  candidate for biphosphonate due to renal failure -he continues to tolerate treatment moderately well overall. Chemo was held briefly following stroke 01/14/22. -he completed 6 cycle induction chemo with CyBorD on 03/21/22 and moved to monthly Dara maintenance on 03/28/22. Plan to continue for a total of 2 years.  -His kappa light chain was 41.9 mg/L on 04/25/2022,M protein not detected   2. Symptom management: Low appetite, anxiety, pain -he is sleeping better with Ambien, Xanax, and Remeron -pain managed with oxycodone per NP Nikki   3. Orthostatic Hypotension and CHF  -Likely secondary to amyloidosis with neuro involvement  -He has significant symptomatic orthostasis, SBP drops to 70's when he stands up -he met with Dr. Aundra Dubin on 10/23/21 and was started on midodrine.   4. 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  Also on folic acid -stable   5. ESRD on HD, secondary to #1 -he is on dialysis Mon, Wed, Fri. -Patient reports that nephrologist plans to do dialysis twice a week starting next week since his amyloidosis has improved.  -creatinine is 4.15 today.    6. Acute Stroke 01/14/22 -presented with neuro changes, including facial droop, slurred speech, and right-sided weakness. MRI  showed numerous infarctions scattered within left MCA. 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 He did not require stent. -He took Brilinta x1 month after hospitalization and has stopped, he continues baby aspirin   7. Hyperlipidemia  -he was started on lipitor while in the hospital 01/14/22 for LDL of 85.5. -given side effects from Lipitor,Advised him to discuss with his PCP. He notes he doesn't have a PCP.     PLAN: -Due for Cycle 10, Day 1 of dara/dex. -Labs from today were reviewed and adequate for treatment. -RTC in  06/20/2022 for labs, follow up with Dr. Burr Medico and next dara/dex treatment   All questions were answered. The patient knows to call the clinic with any problems, questions or concerns. No barriers to learning was detected.  I have spent a total of 30 minutes minutes of face-to-face and non-face-to-face time, preparing to see the patient, performing a medically appropriate examination, counseling and educating the patient, ordering medications/tests, documenting clinical information in the electronic health record, and care coordination.   Dede Query PA-C Dept of Hematology and [place] at [facility] Phone: [phone]

## 2022-05-27 LAB — KAPPA/LAMBDA LIGHT CHAINS
Kappa free light chain: 42.1 mg/L — ABNORMAL HIGH (ref 3.3–19.4)
Kappa, lambda light chain ratio: 1.99 — ABNORMAL HIGH (ref 0.26–1.65)
Lambda free light chains: 21.2 mg/L (ref 5.7–26.3)

## 2022-05-28 ENCOUNTER — Other Ambulatory Visit: Payer: Self-pay | Admitting: Nurse Practitioner

## 2022-05-28 LAB — MULTIPLE MYELOMA PANEL, SERUM
Albumin SerPl Elph-Mcnc: 2.9 g/dL (ref 2.9–4.4)
Albumin/Glob SerPl: 1.3 (ref 0.7–1.7)
Alpha 1: 0.3 g/dL (ref 0.0–0.4)
Alpha2 Glob SerPl Elph-Mcnc: 0.9 g/dL (ref 0.4–1.0)
B-Globulin SerPl Elph-Mcnc: 0.8 g/dL (ref 0.7–1.3)
Gamma Glob SerPl Elph-Mcnc: 0.3 g/dL — ABNORMAL LOW (ref 0.4–1.8)
Globulin, Total: 2.3 g/dL (ref 2.2–3.9)
IgA: 37 mg/dL — ABNORMAL LOW (ref 61–437)
IgG (Immunoglobin G), Serum: 310 mg/dL — ABNORMAL LOW (ref 603–1613)
IgM (Immunoglobulin M), Srm: 57 mg/dL (ref 15–143)
Total Protein ELP: 5.2 g/dL — ABNORMAL LOW (ref 6.0–8.5)

## 2022-05-28 MED ORDER — OXYCODONE HCL 15 MG PO TABS: 15.0000 mg | ORAL_TABLET | Freq: Four times a day (QID) | ORAL | 0 refills | Status: DC | PRN

## 2022-06-03 ENCOUNTER — Encounter: Payer: Self-pay | Admitting: Hematology

## 2022-06-05 ENCOUNTER — Encounter: Payer: Self-pay | Admitting: Hematology

## 2022-06-06 ENCOUNTER — Other Ambulatory Visit: Payer: Self-pay | Admitting: Hematology

## 2022-06-14 ENCOUNTER — Other Ambulatory Visit: Payer: Self-pay | Admitting: Nurse Practitioner

## 2022-06-14 MED ORDER — ALPRAZOLAM 0.5 MG PO TABS: ORAL_TABLET | ORAL | 0 refills | Status: DC

## 2022-06-14 MED ORDER — OXYCODONE HCL 15 MG PO TABS: 15.0000 mg | ORAL_TABLET | Freq: Four times a day (QID) | ORAL | 0 refills | Status: DC | PRN

## 2022-06-17 ENCOUNTER — Encounter: Payer: Self-pay | Admitting: Hematology

## 2022-06-18 ENCOUNTER — Other Ambulatory Visit (HOSPITAL_COMMUNITY): Payer: Self-pay | Admitting: Radiology

## 2022-06-18 ENCOUNTER — Other Ambulatory Visit (HOSPITAL_COMMUNITY): Payer: Self-pay | Admitting: Interventional Radiology

## 2022-06-18 DIAGNOSIS — I771 Stricture of artery: Secondary | ICD-10-CM

## 2022-06-19 ENCOUNTER — Other Ambulatory Visit: Payer: Self-pay | Admitting: Hematology

## 2022-06-19 DIAGNOSIS — E8581 Light chain (AL) amyloidosis: Secondary | ICD-10-CM

## 2022-06-20 ENCOUNTER — Inpatient Hospital Stay (HOSPITAL_BASED_OUTPATIENT_CLINIC_OR_DEPARTMENT_OTHER): Payer: Commercial Managed Care - PPO | Admitting: Hematology

## 2022-06-20 ENCOUNTER — Inpatient Hospital Stay (HOSPITAL_BASED_OUTPATIENT_CLINIC_OR_DEPARTMENT_OTHER): Payer: Commercial Managed Care - PPO | Admitting: Nurse Practitioner

## 2022-06-20 ENCOUNTER — Encounter: Payer: Self-pay | Admitting: Hematology

## 2022-06-20 ENCOUNTER — Inpatient Hospital Stay: Payer: Commercial Managed Care - PPO

## 2022-06-20 ENCOUNTER — Other Ambulatory Visit: Payer: Self-pay

## 2022-06-20 ENCOUNTER — Encounter: Payer: Self-pay | Admitting: Nurse Practitioner

## 2022-06-20 ENCOUNTER — Telehealth: Payer: Self-pay

## 2022-06-20 VITALS — BP 123/61 | HR 85 | Temp 98.0°F | Resp 18 | Ht 70.0 in | Wt 156.1 lb

## 2022-06-20 DIAGNOSIS — F419 Anxiety disorder, unspecified: Secondary | ICD-10-CM

## 2022-06-20 DIAGNOSIS — E8581 Light chain (AL) amyloidosis: Secondary | ICD-10-CM

## 2022-06-20 DIAGNOSIS — G4700 Insomnia, unspecified: Secondary | ICD-10-CM

## 2022-06-20 DIAGNOSIS — G893 Neoplasm related pain (acute) (chronic): Secondary | ICD-10-CM

## 2022-06-20 DIAGNOSIS — Z515 Encounter for palliative care: Secondary | ICD-10-CM

## 2022-06-20 DIAGNOSIS — K5900 Constipation, unspecified: Secondary | ICD-10-CM

## 2022-06-20 DIAGNOSIS — Z5112 Encounter for antineoplastic immunotherapy: Secondary | ICD-10-CM | POA: Diagnosis not present

## 2022-06-20 LAB — COMPREHENSIVE METABOLIC PANEL
ALT: 5 U/L (ref 0–44)
AST: 9 U/L — ABNORMAL LOW (ref 15–41)
Albumin: 3.6 g/dL (ref 3.5–5.0)
Alkaline Phosphatase: 87 U/L (ref 38–126)
Anion gap: 9 (ref 5–15)
BUN: 34 mg/dL — ABNORMAL HIGH (ref 8–23)
CO2: 24 mmol/L (ref 22–32)
Calcium: 8.9 mg/dL (ref 8.9–10.3)
Chloride: 104 mmol/L (ref 98–111)
Creatinine, Ser: 4.48 mg/dL (ref 0.61–1.24)
GFR, Estimated: 13 mL/min — ABNORMAL LOW (ref >60)
Glucose, Bld: 111 mg/dL — ABNORMAL HIGH (ref 70–99)
Potassium: 4.2 mmol/L (ref 3.5–5.1)
Sodium: 137 mmol/L (ref 135–145)
Total Bilirubin: 0.2 mg/dL — ABNORMAL LOW (ref 0.3–1.2)
Total Protein: 6.1 g/dL — ABNORMAL LOW (ref 6.5–8.1)

## 2022-06-20 LAB — CBC WITH DIFFERENTIAL/PLATELET
Abs Immature Granulocytes: 0.02 10*3/uL (ref 0.00–0.07)
Basophils Absolute: 0.1 10*3/uL (ref 0.0–0.1)
Basophils Relative: 1 %
Eosinophils Absolute: 0.3 10*3/uL (ref 0.0–0.5)
Eosinophils Relative: 4 %
HCT: 36.5 % — ABNORMAL LOW (ref 39.0–52.0)
Hemoglobin: 12 g/dL — ABNORMAL LOW (ref 13.0–17.0)
Immature Granulocytes: 0 %
Lymphocytes Relative: 21 %
Lymphs Abs: 1.9 10*3/uL (ref 0.7–4.0)
MCH: 30.7 pg (ref 26.0–34.0)
MCHC: 32.9 g/dL (ref 30.0–36.0)
MCV: 93.4 fL (ref 80.0–100.0)
Monocytes Absolute: 0.7 10*3/uL (ref 0.1–1.0)
Monocytes Relative: 7 %
Neutro Abs: 5.9 10*3/uL (ref 1.7–7.7)
Neutrophils Relative %: 67 %
Platelets: 197 10*3/uL (ref 150–400)
RBC: 3.91 MIL/uL — ABNORMAL LOW (ref 4.22–5.81)
RDW: 13.4 % (ref 11.5–15.5)
WBC: 8.9 10*3/uL (ref 4.0–10.5)
nRBC: 0 % (ref 0.0–0.2)

## 2022-06-20 MED ORDER — DARATUMUMAB-HYALURONIDASE-FIHJ 1800-30000 MG-UT/15ML ~~LOC~~ SOLN
1800.0000 mg | Freq: Once | SUBCUTANEOUS | Status: AC
  Administered 2022-06-20: 1800 mg via SUBCUTANEOUS
  Filled 2022-06-20: qty 15

## 2022-06-20 MED ORDER — DEXAMETHASONE 4 MG PO TABS
20.0000 mg | ORAL_TABLET | Freq: Once | ORAL | Status: AC
  Administered 2022-06-20: 20 mg via ORAL
  Filled 2022-06-20: qty 5

## 2022-06-20 MED ORDER — ACETAMINOPHEN 325 MG PO TABS
650.0000 mg | ORAL_TABLET | Freq: Once | ORAL | Status: AC
  Administered 2022-06-20: 650 mg via ORAL
  Filled 2022-06-20: qty 2

## 2022-06-20 MED ORDER — DIPHENHYDRAMINE HCL 25 MG PO CAPS
50.0000 mg | ORAL_CAPSULE | Freq: Once | ORAL | Status: AC
  Administered 2022-06-20: 50 mg via ORAL
  Filled 2022-06-20: qty 2

## 2022-06-20 MED ORDER — ALPRAZOLAM 1 MG PO TABS: 1.0000 mg | ORAL_TABLET | Freq: Three times a day (TID) | ORAL | 0 refills | Status: DC | PRN

## 2022-06-20 NOTE — Patient Instructions (Signed)
[facility]  Discharge Instructions: Thank you for choosing [facility] to provide your oncology and hematology care.   If you have a lab appointment with the [facility], please go directly to the [facility] and check in at the registration area.   Wear comfortable clothing and clothing appropriate for easy access to any Portacath or PICC line.   We strive to give you quality time with your provider. You may need to reschedule your appointment if you arrive late (15 or more minutes).  Arriving late affects you and other patients whose appointments are after yours.  Also, if you miss three or more appointments without notifying the office, you may be dismissed from the clinic at the provider's discretion.      For prescription refill requests, have your pharmacy contact our office and allow 72 hours for refills to be completed.    Today you received the following chemotherapy and/or immunotherapy agents: Darzalex Faspro      To help prevent nausea and vomiting after your treatment, we encourage you to take your nausea medication as directed.  BELOW ARE SYMPTOMS THAT SHOULD BE REPORTED IMMEDIATELY: *FEVER GREATER THAN 100.4 F (38 C) OR HIGHER *CHILLS OR SWEATING *NAUSEA AND VOMITING THAT IS NOT CONTROLLED WITH YOUR NAUSEA MEDICATION *UNUSUAL SHORTNESS OF BREATH *UNUSUAL BRUISING OR BLEEDING *URINARY PROBLEMS (pain or burning when urinating, or frequent urination) *BOWEL PROBLEMS (unusual diarrhea, constipation, pain near the anus) TENDERNESS IN MOUTH AND THROAT WITH OR WITHOUT PRESENCE OF ULCERS (sore throat, sores in mouth, or a toothache) UNUSUAL RASH, SWELLING OR PAIN  UNUSUAL VAGINAL DISCHARGE OR ITCHING   Items with * indicate a potential emergency and should be followed up as soon as possible or go to the Emergency Department if any problems should occur.  Please show the CHEMOTHERAPY ALERT CARD or IMMUNOTHERAPY ALERT CARD at  check-in to the Emergency Department and triage nurse.  Should you have questions after your visit or need to cancel or reschedule your appointment, please contact [facility]  Dept: [phone]  and follow the prompts.  Office hours are 8:00 a.m. to 4:30 p.m. Monday - Friday. Please note that voicemails left after 4:00 p.m. may not be returned until the following business day.  We are closed weekends and major holidays. You have access to a nurse at all times for urgent questions. Please call the main number to the clinic Dept: [phone] and follow the prompts.   For any non-urgent questions, you may also contact your provider using MyChart. We now offer e-Visits for anyone 72 and older to request care online for non-urgent symptoms. For details visit [email or web address].   Also download the MyChart app! Go to the app store, search "MyChart", open the app, select [facility], and log in with your MyChart username and password.  Masks are optional in the cancer centers. If you would like for your care team to wear a mask while they are taking care of you, please let them know. For doctor visits, patients may have with them one support person who is at least 72 years old. At this time, visitors are not allowed in the infusion area.

## 2022-06-20 NOTE — Telephone Encounter (Signed)
CRITICAL VALUE STICKER  CRITICAL VALUE: Creatinine  RECEIVER (on-site recipient of call):  Lamya Lausch   DATE & TIME NOTIFIED: 06/20/2022 8:32 am   MESSENGER (representative from lab): Lauren  MD NOTIFIED: Dr Burr Medico  TIME OF NOTIFICATION:8:32 am

## 2022-06-20 NOTE — Progress Notes (Signed)
[facility]  [phone] [phone]) [phone]   Name: Caleb Simmons Date: 06/20/2022 MRN: [id number]  DOB: 19-Sep-1951  Patient Care Team: Pcp, No as PCP - General Buntin, Lavonda Jumbo, RN (Inactive) as Registered Nurse Pickenpack-Cousar, Carlena Sax, NP as Nurse Practitioner (Nurse Practitioner)    INTERVAL HISTORY: Caleb Simmons is a 71 y.o. 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  Recently admitted and discharged on 01/20/22 after receiving treatment for left MCA CVA. Palliative following for ongoing goals of care discussions and symptom management.   SOCIAL HISTORY:     reports that he has been smoking cigarettes. He has a 6.25 pack-year smoking history. He has never used smokeless tobacco.  ADVANCE DIRECTIVES:  Patient reports completed document.  [name], Caleb Simmons. is his healthcare power of attorney.  CODE STATUS: Full code  PAST MEDICAL HISTORY:History reviewed. No pertinent past medical history.  ALLERGIES:  has No Known Allergies.  MEDICATIONS:  Current Outpatient Medications  Medication Sig Dispense Refill   acetaminophen (TYLENOL) 325 MG tablet Take 2 tablets (650 mg total) by mouth every 6 (six) hours as needed for moderate pain (headache).     ALPRAZolam (XANAX) 1 MG tablet Take 1 tablet (1 mg total) by mouth every 8 (eight) hours as needed for anxiety or sleep. 90 tablet 0   aspirin 81 MG EC tablet Take 1 tablet (81 mg total) by mouth daily. Swallow whole. 30 tablet 11   folic acid (FOLVITE) 1 MG tablet TAKE 2 TABLETS BY MOUTH EVERY DAY 60 tablet 0   Melatonin 10 MG TABS Take 10 mg by mouth at bedtime.     metoprolol succinate (TOPROL-XL) 25 MG 24 hr tablet TAKE 1/2 TABLET BY MOUTH EVERY DAY 15 tablet 4   midodrine (PROAMATINE) 5 MG tablet Take 1 tablet (5 mg total) by mouth 3 (three) times  daily with meals. 270 tablet 3   mirtazapine (REMERON) 30 MG tablet Take 1 tablet (30 mg total) by mouth at bedtime. 30 tablet 1   ondansetron (ZOFRAN) 4 MG tablet Take 1 tablet (4 mg total) by mouth every 8 (eight) hours as needed for nausea or vomiting. 45 tablet 2   oxyCODONE (ROXICODONE) 15 MG immediate release tablet Take 1 tablet (15 mg total) by mouth every 6 (six) hours as needed. 60 tablet 0   pantoprazole (PROTONIX) 40 MG tablet TAKE 1 TABLET BY MOUTH 2 TIMES DAILY BEFORE A MEAL 60 tablet 0   prochlorperazine (COMPAZINE) 5 MG tablet Take 1 tablet (5 mg total) by mouth every 6 (six) hours as needed for nausea or vomiting. 30 tablet 2   senna-docusate (SENNA S) 8.6-50 MG tablet Take 1 tablet by mouth daily. 30 tablet 6   tamsulosin (FLOMAX) 0.4 MG CAPS capsule TAKE 1 CAPSULE BY MOUTH EVERY DAY 30 capsule 0   zolpidem (AMBIEN) 5 MG tablet TAKE 1 TABLET BY MOUTH AT BEDTIME AS NEEDED FOR SLEEP 30 tablet 1   No current facility-administered medications for this visit.   Facility-Administered Medications Ordered in Other Visits  Medication Dose Route Frequency Provider Last Rate Last Admin   heparin lock flush 100 unit/mL  500 Units Intracatheter Once Truitt Merle, MD       sodium chloride flush (NS) 0.9 % injection 10 mL  10 mL Intracatheter Once Truitt Merle, MD        VITAL  SIGNS: There were no vitals taken for this visit. There were no vitals filed for this visit.    Estimated body mass index is 22.4 kg/m as calculated from the following:   Height as of an earlier encounter on 06/20/22: '5\' 10"'$  (1.778 m).   Weight as of an earlier encounter on 06/20/22: 156 lb 1.6 oz (70.8 kg).   PERFORMANCE STATUS (ECOG) : 2 - Symptomatic, <50% confined to bed   Physical Exam General: NAD, sitting up in recliner Cardiovascular: RRR Pulmonary: clear ant fields, right chest dialysis cath Abdomen: soft, nontender, + bowel sounds Extremities: no edema, no joint deformities Neurological: AAO x4, mood  appropriate   IMPRESSION:  I saw Caleb Simmons during infusion today for symptom management follow-up.  I also spoke to Caleb Simmons.  No acute distress noted.  Continue to express appreciation of having dialysis decreased to twice a week.  Is tolerating well with stable labs.  Caleb Simmons shares he has been feeling much better and she has noticed he has been more active over the past several weeks.  They were able to go out to eat over the weekend and enjoy some time out which they have not been able to do in some months.  Pain Caleb Simmons reports pain is managed with his current Oxy IR dose around the clock. We discussed possibility of starting a more long-acting regimen in the near future. He and Caleb Simmons in agreement if it comes to that. Will continue Oxy IR every 4-5 hrs as needed with close monitoring. Also some concern with ongoing hypotension.    Decreased Appetite Tolerating Remeron 30 mg. Caleb Simmons shares his appetite is up and down.  Seems improved over the past several weeks and he is enjoying foods that he has not eaten in a while without complications.  He feels that the Remeron is helping some. We discussed making adjustments or considering another medication to gain better results. He would like to continue with current regimen for now. Continues to express his appetite decreases close to his treatment days and then escalates the day of treatment and for days after. Weight is has trending down since previous visit. Current weight is holding steady at 156.  Down from 158 at last visit , 164lbs 4/6 on 3/30, 160lbs on 3/2. Will continue to monitor closely.   Constipation Is actively taking Miralax daily for bowel regimen with Senna. Improving.   4.  Anxiety Caleb Simmons complains of ongoing anxiety.  He is tolerating his Ativan without difficulty.  However Caleb Simmons reports he has been taking his Ativan more often than prescribed even though she has noticed an improvement with taking.  Extensive education provided to and  family regarding use of Ativan and frequency.  He knows that he cannot take it as often as he has been and should be only using as prescribed on a as needed basis.  We will continue to closely monitor.    PLAN: Oxy IR 5-10 mg every 4-6 hours. Will continue to monitor closely. If pain continues will consider adding a long-acting regimen. Some concerns with hypotension.   Will closely monitor appetite and make adjustments if no increase or further weight decline. Education provided on small frequent meals. Careful considerations given he is an active dialysis patient. Recommendations for Neproshakes for added nourishment. Currently tolerating Remeron. Weight trending down.   Ativan 1 mg every 8 hours as needed for anxiety/sleep Miralax twice daily Senna-S daily Zofran as needed for nausea.  I will plan to see him back  in 3-4 weeks in collaboration with his other oncology appointments. I will also plan to give him a call on Monday to assess current symptoms and pain.    Patient expressed understanding and was in agreement with this plan. He also understands that He can call the clinic at any time with any questions, concerns, or complaints.    Problems Addressed: One acute or chronic illness or injury with exacerbation, progression, cancer, and pain.    Amount and/or Complexity of Data: Category 3:Discussion of management or test interpretation with external physician/other qualified health care professional/appropriate source (not separately reported). Any controlled substances utilized were prescribed in the context of palliative care.   Time Total: 35 min  Visit consisted of counseling and education dealing with the complex and emotionally intense issues of symptom management and palliative care in the setting of serious and potentially life-threatening illness.Greater than 50%  of this time was spent counseling and coordinating care related to the above assessment and plan.  Alda Lea, AGPCNP-BC  Palliative Medicine Team/[place]

## 2022-06-20 NOTE — Progress Notes (Signed)
Caleb Simmons   Telephone:(336) [phone] Fax:(336) [phone]   Clinic Follow up Note   Patient Care Team: Pcp, No as PCP - General Buntin, Lavonda Jumbo, RN (Inactive) as Registered Nurse [place], Carlena Sax, NP as Nurse Practitioner (Nurse Practitioner)  Date of Service:  06/20/2022  CHIEF COMPLAINT: f/u of multiorgan AL amyloidosis  CURRENT THERAPY:  Daratumumab injection started 09/27/21, now on monthly maintenance with dexa   ASSESSMENT & PLAN:  Caleb Simmons is a 71 y.o. male with   1. AL amyloidosis with renal, cardiac and neuro involvement  -initially presented with weakness and SOB; lab work up showed BUN of 44, Cr 6.5, significant protein/RBC on UA. Right renal biopsy on 08/28/21 revealing AL amyloidosis, lambda light chain composition. 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 Both serum and urine lamda Free light chains significantly elevated. -Due to the multiorgan involvement, with significant CHF (EF 45-50%) and renal failure, his prognosis is very poor. -He began weekly oral Cytoxan, dexa and Velcade injection while inpatient on 09/10/21.  He tolerated well. -He began daratumumab injection 09/27/21. -his light chain levels showed improvement after starting treatment, now overall stable. -not a candidate for biphosphonate due to renal failure -he continues to tolerate treatment moderately well overall. Chemo was held briefly following stroke 01/14/22. -he completed 6 cycle induction chemo with CyBorD on 03/21/22 and moved to monthly Dara maintenance on 03/28/22. Plan to continue for a total of 2 years.  -His most recent kappa light chain was stable at 42.1 mg/L on 05/24/22, M protein not detected. Today's results are pending. -he is clinically stable. Labs reviewed, overall stable.   2. Symptom management: Low appetite, anxiety, pain -he is sleeping better with Ambien, Xanax, and Remeron -pain managed with oxycodone per NP Nikki   3. Orthostatic  Hypotension and CHF  -Likely secondary to amyloidosis with neuro involvement  -He has significant symptomatic orthostasis, SBP drops to 70's when he stands up -he met with Dr. Aundra Dubin on 10/23/21 and was started on midodrine. -next f/u with Dr. Aundra Dubin on 07/02/22.   4. 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 Also on folic acid -improved off chemo, stable at 12 lately.   5. ESRD on HD, secondary to #1 -he is now on dialysis Mon and Fri since his amyloidosis has improved.    6. Acute Stroke 01/14/22 -presented with neuro changes, including facial droop, slurred speech, and right-sided weakness. MRI showed numerous infarctions scattered within left MCA. 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 He did not require stent. -He took Brilinta x1 month after hospitalization and has stopped, he continues baby aspirin   7. Hyperlipidemia  -he was started on lipitor while in the hospital 01/14/22 for LDL of 85.5.     PLAN: -proceed with Cycle 10 of dara/dex today  -NP Lexine Baton will see him in infusion -lab and Dara injection every 4 weeks -f/u in 12 weeks   No problem-specific Assessment & Plan notes found for this encounter.   INTERVAL HISTORY:  Caleb Simmons is here for a follow up of multiorgan AL amyloidosis. He was last seen by PA Murray Hodgkins on 05/24/22. He presents to the clinic accompanied by his wife. He reports things are stable. He notes dialysis is rough, though he is now down to twice a week. He reports his pain is managed, as long  as he takes his medication around the clock. They report he is eating well, and he adds he enjoys protein shakes.   All other systems were reviewed with the patient and are negative.  MEDICAL HISTORY:  History reviewed. No pertinent past medical history.  SURGICAL HISTORY: Past  Surgical History:  Procedure Laterality Date   APPENDECTOMY     IR ANGIO INTRA EXTRACRAN SEL COM CAROTID INNOMINATE BILAT MOD SED  01/16/2022   IR ANGIO VERTEBRAL SEL SUBCLAVIAN INNOMINATE BILAT MOD SED  01/16/2022   IR CT HEAD LTD  01/18/2022   IR EMBO ART  VEN HEMORR LYMPH EXTRAV  INC GUIDE ROADMAPPING  08/30/2021   IR FLUORO GUIDE CV LINE RIGHT  08/30/2021   IR FLUORO GUIDE CV LINE RIGHT  09/18/2021   IR FLUORO GUIDE CV LINE RIGHT  05/02/2022   IR PERCUTANEOUS ART THROMBECTOMY/INFUSION INTRACRANIAL INC DIAG ANGIO  01/18/2022   IR RENAL SELECTIVE  UNI INC S&I MOD SED  08/31/2021   IR US GUIDE VASC ACCESS RIGHT  08/30/2021   IR US GUIDE VASC ACCESS RIGHT  08/30/2021   IR US GUIDE VASC ACCESS RIGHT  09/18/2021   IR US GUIDE VASC ACCESS RIGHT  01/16/2022   IR VENOCAVAGRAM SVC  05/02/2022   RADIOLOGY WITH ANESTHESIA N/A 01/18/2022   Procedure: Cerebral angioplasty with possible stenting;  Surgeon: Luanne Bras, MD;  Location: [place];  Service: Radiology;  Laterality: N/A;    I have reviewed the social history and family history with the patient and they are unchanged from previous note.  ALLERGIES:  has No Known Allergies.  MEDICATIONS:  Current Outpatient Medications  Medication Sig Dispense Refill   acetaminophen (TYLENOL) 325 MG tablet Take 2 tablets (650 mg total) by mouth every 6 (six) hours as needed for moderate pain (headache).     ALPRAZolam (XANAX) 0.5 MG tablet TAKE 1 TABLET BY MOUTH 2 TIMES DAILY AS NEEDED FOR ANXIETY OR SLEEP 60 tablet 0   aspirin 81 MG EC tablet Take 1 tablet (81 mg total) by mouth daily. Swallow whole. 30 tablet 11   folic acid (FOLVITE) 1 MG tablet TAKE 2 TABLETS BY MOUTH EVERY DAY 60 tablet 0   Melatonin 10 MG TABS Take 10 mg by mouth at bedtime.     metoprolol succinate (TOPROL-XL) 25 MG 24 hr tablet TAKE 1/2 TABLET BY MOUTH EVERY DAY 15 tablet 4   midodrine (PROAMATINE) 5 MG tablet Take 1 tablet (5 mg total) by mouth 3 (three) times daily with meals. 270 tablet 3    mirtazapine (REMERON) 30 MG tablet Take 1 tablet (30 mg total) by mouth at bedtime. 30 tablet 1   ondansetron (ZOFRAN) 4 MG tablet Take 1 tablet (4 mg total) by mouth every 8 (eight) hours as needed for nausea or vomiting. 45 tablet 2   oxyCODONE (ROXICODONE) 15 MG immediate release tablet Take 1 tablet (15 mg total) by mouth every 6 (six) hours as needed. 60 tablet 0   pantoprazole (PROTONIX) 40 MG tablet TAKE 1 TABLET BY MOUTH 2 TIMES DAILY BEFORE A MEAL 60 tablet 0   prochlorperazine (COMPAZINE) 5 MG tablet Take 1 tablet (5 mg total) by mouth every 6 (six) hours as needed for nausea or vomiting. 30 tablet 2   senna-docusate (SENNA S) 8.6-50 MG tablet Take 1 tablet by mouth daily. 30 tablet 6   tamsulosin (FLOMAX) 0.4 MG CAPS capsule TAKE 1 CAPSULE BY MOUTH EVERY DAY 30 capsule 0   zolpidem (AMBIEN) 5 MG tablet  TAKE 1 TABLET BY MOUTH AT BEDTIME AS NEEDED FOR SLEEP 30 tablet 1   No current facility-administered medications for this visit.   Facility-Administered Medications Ordered in Other Visits  Medication Dose Route Frequency Provider Last Rate Last Admin   heparin lock flush 100 unit/mL  500 Units Intracatheter Once Truitt Merle, MD       sodium chloride flush (NS) 0.9 % injection 10 mL  10 mL Intracatheter Once Truitt Merle, MD        PHYSICAL EXAMINATION: ECOG PERFORMANCE STATUS: 2 - Symptomatic, <50% confined to bed  Vitals:   06/20/22 0859  BP: 123/61  Pulse: 85  Resp: 18  Temp: 98 F (36.7 C)  SpO2: 97%   Wt Readings from Last 3 Encounters:  06/20/22 156 lb 1.6 oz (70.8 kg)  05/24/22 158 lb 6.4 oz (71.8 kg)  04/25/22 158 lb 8 oz (71.9 kg)     GENERAL:alert, no distress and comfortable SKIN: skin color normal, no rashes or significant lesions EYES: normal, Conjunctiva are pink and non-injected, sclera clear  NEURO: alert & oriented x 3 with fluent speech  LABORATORY DATA:  I have reviewed the data as listed    Latest Ref Rng & Units 06/20/2022    7:54 AM 05/24/2022     8:08 AM 04/25/2022    8:27 AM  CBC  WBC 4.0 - 10.5 K/uL 8.9  10.5  8.1   Hemoglobin 13.0 - 17.0 g/dL 12.0  12.0  11.9   Hematocrit 39.0 - 52.0 % 36.5  36.4  36.9   Platelets 150 - 400 K/uL 197  169  148         Latest Ref Rng & Units 06/20/2022    7:54 AM 05/24/2022    8:08 AM 04/25/2022    8:27 AM  CMP  Glucose 70 - 99 mg/dL 111  162  117   BUN 8 - 23 mg/dL 34  25  15   Creatinine 0.61 - 1.24 mg/dL 4.48  4.15  3.37   Sodium 135 - 145 mmol/L 137  136  137   Potassium 3.5 - 5.1 mmol/L 4.2  4.1  3.8   Chloride 98 - 111 mmol/L 104  104  102   CO2 22 - 32 mmol/L 24  25  27    Calcium 8.9 - 10.3 mg/dL 8.9  9.2  8.8   Total Protein 6.5 - 8.1 g/dL 6.1  5.8  5.7   Total Bilirubin 0.3 - 1.2 mg/dL 0.2  0.3  0.3   Alkaline Phos 38 - 126 U/L 87  86  95   AST 15 - 41 U/L 9  9  10    ALT 0 - 44 U/L 5  5  6        RADIOGRAPHIC STUDIES: I have personally reviewed the radiological images as listed and agreed with the findings in the report. No results found.    No orders of the defined types were placed in this encounter.  All questions were answered. The patient knows to call the clinic with any problems, questions or concerns. No barriers to learning was detected. The total time spent in the appointment was 30 minutes.     Truitt Merle, MD 06/20/2022   I, Wilburn Mylar, am acting as scribe for Truitt Merle, MD.   I have reviewed the above documentation for accuracy and completeness, and I agree with the above.

## 2022-06-21 LAB — KAPPA/LAMBDA LIGHT CHAINS
Kappa free light chain: 44.4 mg/L — ABNORMAL HIGH (ref 3.3–19.4)
Kappa, lambda light chain ratio: 2.02 — ABNORMAL HIGH (ref 0.26–1.65)
Lambda free light chains: 22 mg/L (ref 5.7–26.3)

## 2022-06-24 ENCOUNTER — Encounter: Payer: Self-pay | Admitting: Hematology

## 2022-06-25 LAB — MULTIPLE MYELOMA PANEL, SERUM
Albumin SerPl Elph-Mcnc: 3.1 g/dL (ref 2.9–4.4)
Albumin/Glob SerPl: 1.3 (ref 0.7–1.7)
Alpha 1: 0.3 g/dL (ref 0.0–0.4)
Alpha2 Glob SerPl Elph-Mcnc: 1 g/dL (ref 0.4–1.0)
B-Globulin SerPl Elph-Mcnc: 0.8 g/dL (ref 0.7–1.3)
Gamma Glob SerPl Elph-Mcnc: 0.4 g/dL (ref 0.4–1.8)
Globulin, Total: 2.5 g/dL (ref 2.2–3.9)
IgA: 38 mg/dL — ABNORMAL LOW (ref 61–437)
IgG (Immunoglobin G), Serum: 323 mg/dL — ABNORMAL LOW (ref 603–1613)
IgM (Immunoglobulin M), Srm: 66 mg/dL (ref 15–143)
Total Protein ELP: 5.6 g/dL — ABNORMAL LOW (ref 6.0–8.5)

## 2022-06-26 ENCOUNTER — Telehealth: Payer: Self-pay | Admitting: Hematology

## 2022-06-26 NOTE — Telephone Encounter (Signed)
Left message with follow-up appointments per 6/29 los. 

## 2022-06-27 ENCOUNTER — Ambulatory Visit (HOSPITAL_COMMUNITY)
Admission: RE | Admit: 2022-06-27 | Discharge: 2022-06-27 | Disposition: A | Discharge status: Home / Self Care | Payer: Commercial Managed Care - PPO | Source: Ambulatory Visit | Attending: Interventional Radiology | Admitting: Interventional Radiology

## 2022-06-27 ENCOUNTER — Encounter: Payer: Self-pay | Admitting: Hematology

## 2022-06-27 DIAGNOSIS — I771 Stricture of artery: Secondary | ICD-10-CM | POA: Insufficient documentation

## 2022-06-27 MED ORDER — IOHEXOL 350 MG/ML SOLN
75.0000 mL | Freq: Once | INTRAVENOUS | Status: AC | PRN
  Administered 2022-06-27: 75 mL via INTRAVENOUS

## 2022-06-27 MED ORDER — SODIUM CHLORIDE (PF) 0.9 % IJ SOLN
INTRAMUSCULAR | Status: AC
  Filled 2022-06-27: qty 50

## 2022-06-28 ENCOUNTER — Encounter: Payer: Self-pay | Admitting: Nurse Practitioner

## 2022-06-28 ENCOUNTER — Other Ambulatory Visit: Payer: Self-pay | Admitting: Nurse Practitioner

## 2022-06-28 MED ORDER — OXYCODONE HCL 15 MG PO TABS: 15.0000 mg | ORAL_TABLET | Freq: Four times a day (QID) | ORAL | 0 refills | Status: DC | PRN

## 2022-07-02 ENCOUNTER — Ambulatory Visit (HOSPITAL_COMMUNITY)
Admission: RE | Admit: 2022-07-02 | Discharge: 2022-07-02 | Disposition: A | Discharge status: Home / Self Care | Payer: Commercial Managed Care - PPO | Source: Ambulatory Visit | Attending: Cardiology | Admitting: Cardiology

## 2022-07-02 ENCOUNTER — Other Ambulatory Visit: Payer: Self-pay | Admitting: Hematology

## 2022-07-02 ENCOUNTER — Encounter (HOSPITAL_COMMUNITY): Payer: Self-pay | Admitting: Cardiology

## 2022-07-02 VITALS — BP 104/60 | HR 72 | Wt 154.8 lb

## 2022-07-02 DIAGNOSIS — N186 End stage renal disease: Secondary | ICD-10-CM | POA: Diagnosis not present

## 2022-07-02 DIAGNOSIS — I471 Supraventricular tachycardia: Secondary | ICD-10-CM | POA: Insufficient documentation

## 2022-07-02 DIAGNOSIS — Z79899 Other long term (current) drug therapy: Secondary | ICD-10-CM | POA: Diagnosis not present

## 2022-07-02 DIAGNOSIS — I517 Cardiomegaly: Secondary | ICD-10-CM | POA: Diagnosis not present

## 2022-07-02 DIAGNOSIS — Z992 Dependence on renal dialysis: Secondary | ICD-10-CM

## 2022-07-02 DIAGNOSIS — Z8673 Personal history of transient ischemic attack (TIA), and cerebral infarction without residual deficits: Secondary | ICD-10-CM | POA: Diagnosis not present

## 2022-07-02 DIAGNOSIS — I472 Ventricular tachycardia, unspecified: Secondary | ICD-10-CM | POA: Diagnosis not present

## 2022-07-02 DIAGNOSIS — Z9221 Personal history of antineoplastic chemotherapy: Secondary | ICD-10-CM | POA: Diagnosis not present

## 2022-07-02 DIAGNOSIS — Z7982 Long term (current) use of aspirin: Secondary | ICD-10-CM | POA: Diagnosis not present

## 2022-07-02 DIAGNOSIS — I951 Orthostatic hypotension: Secondary | ICD-10-CM | POA: Diagnosis present

## 2022-07-02 DIAGNOSIS — I43 Cardiomyopathy in diseases classified elsewhere: Secondary | ICD-10-CM

## 2022-07-02 DIAGNOSIS — I5022 Chronic systolic (congestive) heart failure: Secondary | ICD-10-CM

## 2022-07-02 DIAGNOSIS — F17210 Nicotine dependence, cigarettes, uncomplicated: Secondary | ICD-10-CM | POA: Diagnosis not present

## 2022-07-02 DIAGNOSIS — E854 Organ-limited amyloidosis: Secondary | ICD-10-CM | POA: Diagnosis not present

## 2022-07-02 DIAGNOSIS — Z7969 Long term (current) use of other immunomodulators and immunosuppressants: Secondary | ICD-10-CM | POA: Insufficient documentation

## 2022-07-02 NOTE — Patient Instructions (Signed)
EKG done today.  No Labs done today.  No medication changes were made. Please continue all current medications as prescribed.  You have been referred to the [facility]. They will contact you to schedule an appointment.   Your physician recommends that you schedule a follow-up appointment soon for an echo and in 6 months with Dr. Aundra Dubin. Please contact our office in December  to schedule a January 2024 appointment.   Your physician has requested that you have an echocardiogram. Echocardiography is a painless test that uses sound waves to create images of your heart. It provides your doctor with information about the size and shape of your heart and how well your heart's chambers and valves are working. This procedure takes approximately one hour. There are no restrictions for this procedure.  If you have any questions or concerns before your next appointment please send [place] a message through [place] or call our office at [phone].    TO LEAVE A MESSAGE FOR THE NURSE SELECT OPTION 2, PLEASE LEAVE A MESSAGE INCLUDING: YOUR NAME DATE OF BIRTH CALL BACK NUMBER REASON FOR CALL**this is important as we prioritize the call backs  YOU WILL RECEIVE A CALL BACK THE SAME DAY AS LONG AS YOU CALL BEFORE 4:00 PM   Do the following things EVERYDAY: Weigh yourself in the morning before breakfast. Write it down and keep it in a log. Take your medicines as prescribed Eat low salt foods--Limit salt (sodium) to 2000 mg per day.  Stay as active as you can everyday Limit all fluids for the day to less than 2 liters   At the [facility], you and your health needs are our priority. As part of our continuing mission to provide you with exceptional heart care, we have created designated Provider Care Teams. These Care Teams include your primary Cardiologist (physician) and Advanced Practice Providers (APPs- Physician Assistants and Nurse Practitioners) who all work together to provide you  with the care you need, when you need it.   You may see any of the following providers on your designated Care Team at your next follow up: Dr Glori Bickers Dr Haynes Kerns, NP Lyda Jester, [place] Audry Riles, PharmD   Please be sure to bring in all your medications bottles to every appointment. '

## 2022-07-02 NOTE — Progress Notes (Signed)
PCP: Pcp, No Oncology: Dr. Burr [facility]ico Cardiology: Dr. Aundra Dubin  71 y.o. with minimal past history was admitted in 9/22 with AKI, group B strep bacteremia, and acute CHF.  He had to start on dialysis.  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  Echo showed EF 45-50%, moderate LVH, small pericardial effusion. Cardiac MRI showed EF 38%, moderate LVH, small pericardial effusion, elevated T1 suggestive of cardiac amyloidosis. He completed chemotherapy with Velcade and Cytoxan, and is now getting monthly daratumumab.   Seen in clinic 11/22, compression hose and midodrine started for orthostasis.  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 Evaluated by IR and underwent revascularization on 01/18/22 but re-occluded. IR recommended CTA head and neck in 8 weeks. Echo with EF 55-60%, moderate LVH, grade I DD, normal RV. Discharged with outpatient neuro rehab.  Today he returns for post hospital HF follow up. He is smoking 1/2 ppd and is not interested in quitting. He makes urine and is hoping to one day come off HD, currently dialyzes twice a week.  He looks better than in the past. He is mowing his grass.  No palpitations, he is in NSR today.  No dyspnea walking on flat ground.  No chest pain.  No lightheadedness, still taking midodrine.   ECG (personally reviewed): NSR, PAC, PVC, right axis deviation, septal Qs  Labs (10/22): hgb 8.9 Labs (1/23): hgb 7.8  PMH: 1. Nephrolithiasis 2. 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  - Treating with Cytoxan and Velcade.  3. Anemia 4. 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  - Cardiac MRI (9/22): EF 38%, moderate LVH, small pericardial effusion, elevated T1 suggestive of cardiac amyloidosis.   - Echo 1/23 EF 55-60%, mild LVH, Grade I DD, normal RV.  6. Orthostatic hypotension.  7. 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  8. Palpitations: Zio monitor in 2/23 with 2 runs NSVT, multiple runs SVT, no atrial fibrillation.   Social History   Socioeconomic History   Marital status: Married    Spouse name: Not on file   Number of children: Not on file   Years of education: Not on file   Highest education level: Not on file  Occupational History   Not on file  Tobacco Use   Smoking status: Some Days    Packs/day: 0.25    Years: 25.00    Total pack years: 6.25    Types: Cigarettes   Smokeless tobacco: Never  Substance and Sexual Activity   Alcohol use: Not on file   Drug use: Not on file   Sexual activity: Not on file  Other Topics Concern   Not on file  Social History Narrative   Not on file   Social Determinants of Health   Financial Resource Strain: Low Risk  (09/11/2021)   Overall Financial Resource Strain (CARDIA)    Difficulty of Paying Living Expenses: Not very hard  Food Insecurity: No Food Insecurity (09/11/2021)   Hunger Vital Sign    Worried About Running Out of Food in the Last Year: Never true    [place] in the Last Year: Never true  Transportation Needs: No Transportation Needs (09/11/2021)   PRAPARE - Hydrologist (Medical): No    Lack of Transportation (Non-Medical): No  Physical Activity: Not on file  Stress: Not on file  Social Connections: Not on file  Intimate Partner Violence: Not on file   History reviewed. No pertinent family history.  ROS: All systems reviewed and negative except as per HPI.   Current Outpatient Medications  Medication Sig Dispense Refill   acetaminophen (TYLENOL) 325 MG tablet Take 2 tablets (650 mg total) by mouth every 6 (six) hours as needed for moderate pain (headache).     ALPRAZolam (XANAX) 1 MG tablet Take 1 tablet (1 mg total) by mouth every 8 (eight) hours as needed for anxiety or sleep. 90 tablet 0   aspirin 81 MG EC tablet Take 1 tablet  (81 mg total) by mouth daily. Swallow whole. 30 tablet 11   folic acid (FOLVITE) 1 MG tablet TAKE 2 TABLETS BY MOUTH EVERY DAY 60 tablet 0   Melatonin 10 MG TABS Take 10 mg by mouth at bedtime.     metoprolol succinate (TOPROL-XL) 25 MG 24 hr tablet TAKE 1/2 TABLET BY MOUTH EVERY DAY 15 tablet 4   midodrine (PROAMATINE) 5 MG tablet Take 1 tablet (5 mg total) by mouth 3 (three) times daily with meals. 270 tablet 3   mirtazapine (REMERON) 30 MG tablet Take 1 tablet (30 mg total) by mouth at bedtime. 30 tablet 1   ondansetron (ZOFRAN) 4 MG tablet Take 1 tablet (4 mg total) by mouth every 8 (eight) hours as needed for nausea or vomiting. 45 tablet 2   oxyCODONE (ROXICODONE) 15 MG immediate release tablet Take 1 tablet (15 mg total) by mouth every 6 (six) hours as needed. 60 tablet 0   pantoprazole (PROTONIX) 40 MG tablet TAKE 1 TABLET BY MOUTH 2 TIMES DAILY BEFORE A MEAL 60 tablet 0   prochlorperazine (COMPAZINE) 5 MG tablet Take 1 tablet (5 mg total) by mouth every 6 (six) hours as needed for nausea or vomiting. 30 tablet 2   senna-docusate (SENNA S) 8.6-50 MG tablet Take 1 tablet by mouth daily. 30 tablet 6   tamsulosin (FLOMAX) 0.4 MG CAPS capsule TAKE 1 CAPSULE BY MOUTH EVERY DAY 30 capsule 0   zolpidem (AMBIEN) 5 MG tablet TAKE 1 TABLET BY MOUTH AT BEDTIME AS NEEDED FOR SLEEP 30 tablet 1   No current facility-administered medications for this encounter.   Facility-Administered Medications Ordered in Other Encounters  Medication Dose Route Frequency Provider Last Rate Last Admin   heparin lock flush 100 unit/mL  500 Units Intracatheter Once Truitt Merle, MD       sodium chloride flush (NS) 0.9 % injection 10 mL  10 mL Intracatheter Once Truitt Merle, MD       Wt Readings from Last 3 Encounters:  07/02/22 70.2 kg (154 lb 12.8 oz)  06/20/22 70.8 kg (156 lb 1.6 oz)  05/24/22 71.8 kg (158 lb 6.4 oz)   BP 104/60   Pulse 72   Wt 70.2 kg (154 lb 12.8 oz)   SpO2 98%   BMI 22.21 kg/m  General:  NAD Neck: No JVD, no thyromegaly or thyroid nodule.  Lungs: Clear to auscultation bilaterally with normal respiratory effort. CV: Nondisplaced PMI.  Heart regular S1/S2, no S3/S4, no murmur.  No peripheral edema.  No carotid bruit.  Normal pedal pulses.  Abdomen: Soft, nontender, no hepatosplenomegaly, no distention.  Skin: Intact without lesions or rashes.  Neurologic: Alert and oriented x 3.  Psych: Normal affect. Extremities: No clubbing or cyanosis.  HEENT: Normal.   Assessment/Plan: 1. Cardiac amyloidosis: Cardiac MRI is suggestive of cardiac amyloidosis (unable to  give contrast due to ESRD).  He has biopsy-proven AL amyloidosis from renal and bone marrow biopsies.  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  EF was low (38%) on cardiac MRI, but GDMT is limited by ESRD and orthostatic hypotension.  Echo 1/23 EF 55-60%, mild LVH, normal RV. He is not volume overloaded on exam. He has finished chemotherapy with Velcade and Cytoxan, now getting monthly daratumumab.  - Repeat echo post treatment, will set up.  2. 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 Seems better now.  - Continue midodrine 5 mg tid.  - Continue graded compression stockings during the day.  3. ESRD: Suspect due to amyloidosis.  There is some hope that he may be able to come off HD. Currently only dialyzing twice a week.  4. CVA: 1/23 left MCA, left ICA occlusion/high grade stenosis. Neuro IR attempted re-vascularization.  - Continue ASA 81 daily. - Myalgias with atorvastatin and stopped it.  Refer to lipid clinic for [place].  5. Tobacco Use: Discussed cessation, not ready. 6. Palpitations: Zio monitor in 2/23 showed 2 NSVT runs and multiple SVT runs.  - Continue Toprol XL 12.5 gm daily.   Followup 6 months  Loralie Champagne. 07/02/2022

## 2022-07-03 ENCOUNTER — Encounter: Payer: Self-pay | Admitting: Nurse Practitioner

## 2022-07-03 ENCOUNTER — Inpatient Hospital Stay: Payer: Commercial Managed Care - PPO | Attending: Hematology | Admitting: Nurse Practitioner

## 2022-07-03 DIAGNOSIS — C9000 Multiple myeloma not having achieved remission: Secondary | ICD-10-CM | POA: Insufficient documentation

## 2022-07-03 DIAGNOSIS — E8581 Light chain (AL) amyloidosis: Secondary | ICD-10-CM | POA: Diagnosis not present

## 2022-07-03 DIAGNOSIS — F419 Anxiety disorder, unspecified: Secondary | ICD-10-CM | POA: Diagnosis not present

## 2022-07-03 DIAGNOSIS — Z515 Encounter for palliative care: Secondary | ICD-10-CM

## 2022-07-03 DIAGNOSIS — N186 End stage renal disease: Secondary | ICD-10-CM | POA: Insufficient documentation

## 2022-07-03 DIAGNOSIS — G893 Neoplasm related pain (acute) (chronic): Secondary | ICD-10-CM | POA: Diagnosis not present

## 2022-07-03 DIAGNOSIS — Z5112 Encounter for antineoplastic immunotherapy: Secondary | ICD-10-CM | POA: Insufficient documentation

## 2022-07-03 NOTE — Progress Notes (Signed)
[place]  [phone]) [phone] [phone]) [phone]   Name: Caleb Simmons Date: 07/03/2022 MRN: [id number]  DOB: 06-Jan-1951  Patient Care Team: Pcp, No as PCP - General [place], Lavonda Jumbo, RN (Inactive) as Registered Nurse [place], Carlena Sax, NP as Nurse Practitioner (Nurse Practitioner)   I connected with [name] on 07/03/22 at 10:30 AM EDT by phone and verified that I am speaking with the correct person using two identifiers.   I discussed the limitations, risks, security and privacy concerns of performing an evaluation and management service by telemedicine and the availability of in-person appointments. I also discussed with the patient that there may be a patient responsible charge related to this service. The patient expressed understanding and agreed to proceed.   Other persons participating in the visit and their role in the encounter: Caleb Simmons (Significant Other), [name], RN    Patient's location: Home  Provider's location: [place]     INTERVAL HISTORY: Caleb Simmons is a 71 y.o. 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  Recently admitted and discharged on 01/20/22 after receiving treatment for left MCA CVA. Palliative following for ongoing goals of care discussions and symptom management.   SOCIAL HISTORY:     reports that he has been smoking cigarettes. He has a 6.25 pack-year smoking history. He has never used smokeless tobacco.  ADVANCE DIRECTIVES:  Patient reports completed document.  Son Caleb, Simmons. is his healthcare power of attorney.  CODE STATUS: Full code  PAST MEDICAL HISTORY:History reviewed. No pertinent past medical history.  ALLERGIES:  has No Known Allergies.  MEDICATIONS:  Current Outpatient Medications  Medication Sig Dispense Refill   acetaminophen  (TYLENOL) 325 MG tablet Take 2 tablets (650 mg total) by mouth every 6 (six) hours as needed for moderate pain (headache).     ALPRAZolam (XANAX) 1 MG tablet Take 1 tablet (1 mg total) by mouth every 8 (eight) hours as needed for anxiety or sleep. 90 tablet 0   aspirin 81 MG EC tablet Take 1 tablet (81 mg total) by mouth daily. Swallow whole. 30 tablet 11   folic acid (FOLVITE) 1 MG tablet TAKE 2 TABLETS BY MOUTH EVERY DAY 60 tablet 0   Melatonin 10 MG TABS Take 10 mg by mouth at bedtime.     metoprolol succinate (TOPROL-XL) 25 MG 24 hr tablet TAKE 1/2 TABLET BY MOUTH EVERY DAY 15 tablet 4   midodrine (PROAMATINE) 5 MG tablet Take 1 tablet (5 mg total) by mouth 3 (three) times daily with meals. 270 tablet 3   mirtazapine (REMERON) 30 MG tablet Take 1 tablet (30 mg total) by mouth at bedtime. 30 tablet 1   ondansetron (ZOFRAN) 4 MG tablet Take 1 tablet (4 mg total) by mouth every 8 (eight) hours as needed for nausea or vomiting. 45 tablet 2   oxyCODONE (ROXICODONE) 15 MG immediate release tablet Take 1 tablet (15 mg total) by mouth every 6 (six) hours as needed. 60 tablet 0   pantoprazole (PROTONIX) 40 MG tablet TAKE 1 TABLET BY MOUTH 2 TIMES DAILY BEFORE A MEAL 60 tablet 0   prochlorperazine (COMPAZINE) 5 MG tablet Take 1 tablet (5 mg total) by mouth every 6 (six) hours as needed for nausea or vomiting. 30 tablet 2   senna-docusate (SENNA S) 8.6-50 MG tablet Take 1 tablet by mouth daily. 30 tablet  6   tamsulosin (FLOMAX) 0.4 MG CAPS capsule TAKE 1 CAPSULE BY MOUTH EVERY DAY 30 capsule 0   zolpidem (AMBIEN) 5 MG tablet TAKE 1 TABLET BY MOUTH AT BEDTIME AS NEEDED FOR SLEEP 30 tablet 1   No current facility-administered medications for this visit.   Facility-Administered Medications Ordered in Other Visits  Medication Dose Route Frequency Provider Last Rate Last Admin   heparin lock flush 100 unit/mL  500 Units Intracatheter Once Caleb Merle, MD       sodium chloride flush (NS) 0.9 % injection 10 mL   10 mL Intracatheter Once Caleb Merle, MD        VITAL SIGNS: There were no vitals taken for this visit. There were no vitals filed for this visit.    Estimated body mass index is 22.21 kg/m as calculated from the following:   Height as of 06/20/22: '5\' 10"'$  (1.778 m).   Weight as of 07/02/22: 154 lb 12.8 oz (70.2 kg).   PERFORMANCE STATUS (ECOG) : 2 - Symptomatic, <50% confined to bed   IMPRESSION:  I connected with Caleb Simmons by phone for symptom management follow-up. Caleb Simmons is also present. Caleb Simmons denies any acute distress.  Reports he is feeling better now than he was on previous visit.  Activity level slowly increasing.  This varies day by day.  Pain Caleb Simmons states his pain is well controlled.  He is currently taking Oxy IR as needed.  Pain has decreased over the past week.  Feels his current regimen is effective.  Ongoing discussions regarding long-acting medication in the setting of ongoing pain.  We will continue to closely monitor and adjust as needed.    Decreased Appetite Tolerating Remeron 30 mg. Caleb Simmons shares his appetite is up and down.  Seems improved over the past several weeks and he is enjoying foods that he has not eaten in a while without complications.  He feels that the Remeron is helping some. We discussed making adjustments or considering another medication to gain better results. He would like to continue with current regimen for now. Continues to express his appetite decreases close to his treatment days and then escalates the day of treatment and for days after. Weight is has trending down since previous visit. Current weight is holding steady at 156.  Down from 158 at last visit , 164lbs 4/6 on 3/30, 160lbs on 3/2. Will continue to monitor closely.   Constipation Is actively taking Miralax daily for bowel regimen with Senna. Improving.   4.  Anxiety [name] complains of ongoing anxiety.  Anxiety is now much more controlled on current regimen.  He is taking Ativan as  prescribed confirming he is not taking as often as he was previously.  We will continue to closely monitor.     PLAN: Oxy IR 5-10 mg every 4-6 hours. Will continue to monitor closely. If pain continues will consider adding a long-acting regimen. Some concerns with hypotension.   Will closely monitor appetite and make adjustments if no increase or further weight decline. Education provided on small frequent meals. Careful considerations given he is an active dialysis patient. Recommendations for Neproshakes for added nourishment. Currently tolerating Remeron. Weight trending down.   Ativan 1 mg every 8 hours as needed for anxiety/sleep Miralax twice daily Senna-S daily Zofran as needed for nausea.  I will plan to see him back in 3-4 weeks in collaboration with his other oncology appointments.   Patient expressed understanding and was in agreement with this plan. He  also understands that He can call the clinic at any time with any questions, concerns, or complaints.    Any controlled substances utilized were prescribed in the context of palliative care. PDMP has been reviewed.     Time Total: 20 min   Visit consisted of counseling and education dealing with the complex and emotionally intense issues of symptom management and palliative care in the setting of serious and potentially life-threatening illness.Greater than 50%  of this time was spent counseling and coordinating care related to the above assessment and plan.  Alda Lea, AGPCNP-BC  Palliative Medicine Team/[place]

## 2022-07-04 ENCOUNTER — Other Ambulatory Visit (HOSPITAL_COMMUNITY): Payer: Self-pay | Admitting: Interventional Radiology

## 2022-07-04 ENCOUNTER — Telehealth (HOSPITAL_COMMUNITY): Payer: Self-pay

## 2022-07-04 DIAGNOSIS — I771 Stricture of artery: Secondary | ICD-10-CM

## 2022-07-04 NOTE — Telephone Encounter (Signed)
Called to schedule diagnostic angiogram, no answer, left vm. AW  

## 2022-07-11 ENCOUNTER — Other Ambulatory Visit: Payer: Self-pay | Admitting: Nurse Practitioner

## 2022-07-11 MED ORDER — OXYCODONE HCL 15 MG PO TABS: 15.0000 mg | ORAL_TABLET | Freq: Four times a day (QID) | ORAL | 0 refills | Status: DC | PRN

## 2022-07-16 ENCOUNTER — Other Ambulatory Visit: Payer: Self-pay | Admitting: Radiology

## 2022-07-16 ENCOUNTER — Encounter (HOSPITAL_COMMUNITY): Payer: Self-pay

## 2022-07-16 ENCOUNTER — Other Ambulatory Visit: Payer: Self-pay

## 2022-07-16 ENCOUNTER — Other Ambulatory Visit (HOSPITAL_COMMUNITY): Payer: Self-pay | Admitting: Interventional Radiology

## 2022-07-16 ENCOUNTER — Ambulatory Visit (HOSPITAL_COMMUNITY)
Admission: RE | Admit: 2022-07-16 | Discharge: 2022-07-16 | Disposition: A | Discharge status: Home / Self Care | Payer: Commercial Managed Care - PPO | Source: Ambulatory Visit | Attending: Interventional Radiology | Admitting: Interventional Radiology

## 2022-07-16 VITALS — BP 126/77 | HR 68 | Temp 98.1°F | Resp 12 | Ht 70.0 in | Wt 150.0 lb

## 2022-07-16 DIAGNOSIS — F17210 Nicotine dependence, cigarettes, uncomplicated: Secondary | ICD-10-CM | POA: Insufficient documentation

## 2022-07-16 DIAGNOSIS — I6522 Occlusion and stenosis of left carotid artery: Secondary | ICD-10-CM | POA: Insufficient documentation

## 2022-07-16 DIAGNOSIS — I771 Stricture of artery: Secondary | ICD-10-CM

## 2022-07-16 HISTORY — PX: IR US GUIDE VASC ACCESS RIGHT: IMG2390

## 2022-07-16 HISTORY — PX: IR ANGIO VERTEBRAL SEL VERTEBRAL UNI L MOD SED: IMG5367

## 2022-07-16 HISTORY — PX: IR ANGIO INTRA EXTRACRAN SEL COM CAROTID INNOMINATE BILAT MOD SED: IMG5360

## 2022-07-16 HISTORY — PX: IR ANGIO VERTEBRAL SEL SUBCLAVIAN INNOMINATE UNI R MOD SED: IMG5365

## 2022-07-16 LAB — POCT I-STAT, CHEM 8
BUN: 19 mg/dL (ref 8–23)
Calcium, Ion: 1.16 mmol/L (ref 1.15–1.40)
Chloride: 103 mmol/L (ref 98–111)
Creatinine, Ser: 3.2 mg/dL — ABNORMAL HIGH (ref 0.61–1.24)
Glucose, Bld: 95 mg/dL (ref 70–99)
HCT: 36 % — ABNORMAL LOW (ref 39.0–52.0)
Hemoglobin: 12.2 g/dL — ABNORMAL LOW (ref 13.0–17.0)
Potassium: 4.3 mmol/L (ref 3.5–5.1)
Sodium: 139 mmol/L (ref 135–145)
TCO2: 28 mmol/L (ref 22–32)

## 2022-07-16 LAB — CBC
HCT: 38.4 % — ABNORMAL LOW (ref 39.0–52.0)
Hemoglobin: 12.4 g/dL — ABNORMAL LOW (ref 13.0–17.0)
MCH: 30.7 pg (ref 26.0–34.0)
MCHC: 32.3 g/dL (ref 30.0–36.0)
MCV: 95 fL (ref 80.0–100.0)
Platelets: 247 10*3/uL (ref 150–400)
RBC: 4.04 MIL/uL — ABNORMAL LOW (ref 4.22–5.81)
RDW: 15 % (ref 11.5–15.5)
WBC: 12.4 10*3/uL — ABNORMAL HIGH (ref 4.0–10.5)
nRBC: 0 % (ref 0.0–0.2)

## 2022-07-16 LAB — PROTIME-INR
INR: 0.9 (ref 0.8–1.2)
Prothrombin Time: 12.3 seconds (ref 11.4–15.2)

## 2022-07-16 MED ORDER — MIDAZOLAM HCL 2 MG/2ML IJ SOLN
INTRAMUSCULAR | Status: AC | PRN
  Administered 2022-07-16: 1 mg via INTRAVENOUS
  Administered 2022-07-16: 2 mg via INTRAVENOUS
  Administered 2022-07-16: 1 mg via INTRAVENOUS

## 2022-07-16 MED ORDER — MIDAZOLAM HCL 2 MG/2ML IJ SOLN
INTRAMUSCULAR | Status: AC
  Filled 2022-07-16: qty 2

## 2022-07-16 MED ORDER — SODIUM CHLORIDE (PF) 0.9 % IJ SOLN
INTRAVENOUS | Status: AC | PRN
  Administered 2022-07-16: 200 ug via INTRA_ARTERIAL

## 2022-07-16 MED ORDER — SODIUM CHLORIDE 0.9 % IV SOLN: INTRAVENOUS | Status: DC

## 2022-07-16 MED ORDER — HEPARIN SODIUM (PORCINE) 1000 UNIT/ML IJ SOLN
INTRAMUSCULAR | Status: AC
  Filled 2022-07-16: qty 10

## 2022-07-16 MED ORDER — ASPIRIN 325 MG PO TABS
ORAL_TABLET | ORAL | Status: AC | PRN
  Administered 2022-07-16: 81 mg via ORAL

## 2022-07-16 MED ORDER — VERAPAMIL HCL 2.5 MG/ML IV SOLN
INTRAVENOUS | Status: AC
  Filled 2022-07-16: qty 2

## 2022-07-16 MED ORDER — VERAPAMIL HCL 2.5 MG/ML IV SOLN
INTRA_ARTERIAL | Status: AC | PRN
  Administered 2022-07-16: 7 mL via INTRA_ARTERIAL

## 2022-07-16 MED ORDER — LIDOCAINE HCL 1 % IJ SOLN
INTRAMUSCULAR | Status: AC
  Filled 2022-07-16: qty 20

## 2022-07-16 MED ORDER — ASPIRIN 81 MG PO CHEW
CHEWABLE_TABLET | ORAL | Status: AC
  Filled 2022-07-16: qty 1

## 2022-07-16 MED ORDER — MIDAZOLAM HCL 2 MG/2ML IJ SOLN
INTRAMUSCULAR | Status: AC
  Filled 2022-07-16: qty 4

## 2022-07-16 MED ORDER — NITROGLYCERIN 1 MG/10 ML FOR IR/CATH LAB
INTRA_ARTERIAL | Status: AC
  Filled 2022-07-16: qty 10

## 2022-07-16 MED ORDER — IOHEXOL 300 MG/ML  SOLN
100.0000 mL | Freq: Once | INTRAMUSCULAR | Status: AC | PRN
  Administered 2022-07-16: 80 mL via INTRA_ARTERIAL

## 2022-07-16 MED ORDER — FENTANYL CITRATE (PF) 100 MCG/2ML IJ SOLN
INTRAMUSCULAR | Status: AC
  Filled 2022-07-16: qty 2

## 2022-07-16 MED ORDER — FENTANYL CITRATE (PF) 100 MCG/2ML IJ SOLN
INTRAMUSCULAR | Status: AC | PRN
  Administered 2022-07-16 (×4): 50 ug via INTRAVENOUS

## 2022-07-16 MED ORDER — LIDOCAINE HCL (PF) 1 % IJ SOLN
INTRAMUSCULAR | Status: AC | PRN
  Administered 2022-07-16: 5 mL

## 2022-07-16 MED ORDER — SODIUM CHLORIDE 0.9 % IV SOLN: INTRAVENOUS | Status: AC

## 2022-07-16 NOTE — Progress Notes (Addendum)
Chief Complaint: Patient was seen in consultation today for Cerebral arteriogram  at the request of Dr Leafy Half   Supervising Physician: Luanne Bras  Patient Status: [facility] - Out-pt  History of Present Illness: Caleb Simmons is a 71 y.o. 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  CT 06/27/22: IMPRESSION: No acute intracranial abnormality. Left cervical ICA is now patent with plaque causing less than 50% stenosis. Intracranially, there is no flow within the proximal left ICA with mixed plaque and luminal irregularity along the cavernous segment resulting in marked stenosis. Caliber improves at the level of the clinoid. Unchanged occlusion or high-grade stenosis of the left A1 ACA with distal filling likely via the anterior communicating artery.  Pt is asymptomatic; denies N/V Denies dizziness; or tingling Denies vision or speech changes  Scheduled today for recheck arteriogram   History reviewed. No pertinent past medical history.  Past Surgical History:  Procedure Laterality Date   APPENDECTOMY     IR ANGIO INTRA EXTRACRAN SEL COM CAROTID INNOMINATE BILAT MOD SED  01/16/2022   IR ANGIO VERTEBRAL SEL SUBCLAVIAN INNOMINATE BILAT MOD SED  01/16/2022   IR CT HEAD LTD  01/18/2022   IR EMBO ART  VEN HEMORR LYMPH EXTRAV  INC GUIDE ROADMAPPING  08/30/2021   IR FLUORO GUIDE CV LINE RIGHT  08/30/2021   IR FLUORO GUIDE CV LINE  RIGHT  09/18/2021   IR FLUORO GUIDE CV LINE RIGHT  05/02/2022   IR PERCUTANEOUS ART THROMBECTOMY/INFUSION INTRACRANIAL INC DIAG ANGIO  01/18/2022   IR RENAL SELECTIVE  UNI INC S&I MOD SED  08/31/2021   IR US GUIDE VASC ACCESS RIGHT  08/30/2021   IR US GUIDE VASC ACCESS RIGHT  08/30/2021   IR US GUIDE VASC ACCESS RIGHT  09/18/2021   IR US GUIDE VASC ACCESS RIGHT  01/16/2022   IR VENOCAVAGRAM SVC  05/02/2022   RADIOLOGY WITH ANESTHESIA N/A 01/18/2022   Procedure: Cerebral angioplasty with possible stenting;  Surgeon: Luanne Bras, MD;  Location: [facility];  Service: Radiology;  Laterality: N/A;    Allergies: Patient has no known allergies.  Medications: Prior to Admission medications   Medication Sig Start Date End Date Taking? Authorizing Provider  acetaminophen (TYLENOL) 500 MG tablet Take 1,000 mg by mouth every 6 (six) hours as needed for moderate pain.   Yes [provider]  ALPRAZolam (XANAX) 1 MG tablet Take 1 tablet (1 mg total) by mouth every 8 (eight) hours as needed for anxiety or sleep. 06/20/22  Yes Pickenpack-Cousar, Carlena Sax, NP  aspirin 81 MG EC tablet Take 1 tablet (81 mg total) by mouth daily. Swallow whole. 01/20/22  Yes Mercy Riding, MD  cetirizine (ZYRTEC) 10 MG tablet Take 10 mg by mouth daily.   Yes [provider]  diclofenac Sodium (VOLTAREN) 1 % GEL Apply 1 Application topically 4 (four) times daily as needed (pain).   Yes [provider]  folic acid (FOLVITE) 1 MG tablet TAKE 2  TABLETS BY MOUTH EVERY DAY 07/02/22  Yes Truitt Merle, MD  Melatonin 10 MG TABS Take 10 mg by mouth at bedtime.   Yes [provider]  metoprolol succinate (TOPROL-XL) 25 MG 24 hr tablet TAKE 1/2 TABLET BY MOUTH EVERY DAY 03/26/22  Yes Larey Dresser, MD  midodrine (PROAMATINE) 5 MG tablet Take 1 tablet (5 mg total) by mouth 3 (three) times daily with meals. 10/23/21  Yes Larey Dresser, MD  mirtazapine (REMERON) 30 MG tablet Take 1 tablet (30 mg total) by mouth at  bedtime. 05/24/22  Yes Pickenpack-Cousar, Carlena Sax, NP  omeprazole (PRILOSEC) 20 MG capsule Take 20 mg by mouth daily as needed (acid reflux).   Yes [provider]  ondansetron (ZOFRAN) 4 MG tablet Take 1 tablet (4 mg total) by mouth every 8 (eight) hours as needed for nausea or vomiting. 03/28/22  Yes Pickenpack-Cousar, Carlena Sax, NP  oxyCODONE (ROXICODONE) 15 MG immediate release tablet Take 1 tablet (15 mg total) by mouth every 6 (six) hours as needed. 07/11/22  Yes Pickenpack-Cousar, Carlena Sax, NP  pantoprazole (PROTONIX) 40 MG tablet TAKE 1 TABLET BY MOUTH 2 TIMES DAILY BEFORE A MEAL 07/02/22  Yes Truitt Merle, MD  prochlorperazine (COMPAZINE) 5 MG tablet Take 1 tablet (5 mg total) by mouth every 6 (six) hours as needed for nausea or vomiting. Patient taking differently: Take 5 mg by mouth every 4 (four) hours as needed for nausea or vomiting. 03/28/22  Yes Pickenpack-Cousar, Athena N, NP  senna-docusate (SENNA S) 8.6-50 MG tablet Take 1 tablet by mouth daily. 04/11/22  Yes Pickenpack-Cousar, Carlena Sax, NP  tamsulosin (FLOMAX) 0.4 MG CAPS capsule TAKE 1 CAPSULE BY MOUTH EVERY DAY 07/02/22  Yes Truitt Merle, MD  zolpidem (AMBIEN) 5 MG tablet TAKE 1 TABLET BY MOUTH AT BEDTIME AS NEEDED FOR SLEEP Patient taking differently: Take 5 mg by mouth at bedtime. 06/06/22  Yes Pickenpack-Cousar, Carlena Sax, NP     History reviewed. No pertinent family history.  Social History   Socioeconomic History   Marital status: Married    Spouse name: Not on file   Number of children: Not on file   Years of education: Not on file   Highest education level: Not on file  Occupational History   Not on file  Tobacco Use   Smoking status: Some Days    Packs/day: 0.25    Years: 25.00    Total pack years: 6.25    Types: Cigarettes   Smokeless tobacco: Never  Substance and Sexual Activity   Alcohol use: Not on file   Drug use: Not on file   Sexual activity: Not on file  Other Topics Concern   Not on file  Social  History Narrative   Not on file   Social Determinants of Health   Financial Resource Strain: Low Risk  (09/11/2021)   Overall Financial Resource Strain (CARDIA)    Difficulty of Paying Living Expenses: Not very hard  Food Insecurity: No Food Insecurity (09/11/2021)   Hunger Vital Sign    Worried About Running Out of Food in the Last Year: Never true    [place] in the Last Year: Never true  Transportation Needs: No Transportation Needs (09/11/2021)   PRAPARE - Hydrologist (Medical): No    Lack of Transportation (Non-Medical): No  Physical Activity: Not on file  Stress: Not on file  Social Connections: Not on file     Review of Systems: A  12 point ROS discussed and pertinent positives are indicated in the HPI above.  All other systems are negative.  Review of Systems  Constitutional:  Negative for activity change, fatigue and fever.  HENT:  Negative for tinnitus and trouble swallowing.   Eyes:  Negative for visual disturbance.  Respiratory:  Negative for cough and shortness of breath.   Cardiovascular:  Negative for chest pain.  Gastrointestinal:  Negative for abdominal pain, nausea and vomiting.  Musculoskeletal:  Negative for back pain and gait problem.  Neurological:  Negative for dizziness, tremors, seizures, syncope, facial asymmetry, speech difficulty, weakness, light-headedness, numbness and headaches.  Psychiatric/Behavioral:  Negative for behavioral problems and confusion.     Vital Signs: BP (!) 143/98   Pulse 88   Temp 98.1 F (36.7 C) (Oral)   Resp 18   Ht '5\' 10"'$  (1.778 m)   Wt 150 lb (68 kg)   SpO2 96%   BMI 21.52 kg/m     Physical Exam Vitals reviewed.  HENT:     Mouth/Throat:     Mouth: Mucous membranes are moist.  Eyes:     Extraocular Movements: Extraocular movements intact.  Cardiovascular:     Rate and Rhythm: Caleb rate and regular rhythm.  Pulmonary:     Effort: Pulmonary effort is Caleb.     Breath  sounds: Caleb breath sounds.  Abdominal:     Palpations: Abdomen is soft.     Tenderness: There is no abdominal tenderness.  Musculoskeletal:        General: Caleb range of motion.     Cervical back: Caleb range of motion.  Skin:    General: Skin is warm.  Neurological:     Mental Status: He is alert and oriented to person, place, and time.  Psychiatric:        Behavior: Behavior Caleb.        Thought Content: Thought content Caleb.        Judgment: Judgment Caleb.     Imaging: CT ANGIO HEAD W OR WO CONTRAST  Result Date: 06/27/2022 CLINICAL DATA:  Stenosis of artery EXAM: CT ANGIOGRAPHY HEAD AND NECK TECHNIQUE: Multidetector CT imaging of the head and neck was performed using the standard protocol during bolus administration of intravenous contrast. Multiplanar CT image reconstructions and MIPs were obtained to evaluate the vascular anatomy. Carotid stenosis measurements (when applicable) are obtained utilizing NASCET criteria, using the distal internal carotid diameter as the denominator. RADIATION DOSE REDUCTION: This exam was performed according to the departmental dose-optimization program which includes automated exposure control, adjustment of the mA and/or kV according to patient size and/or use of iterative reconstruction technique. CONTRAST:  73m OMNIPAQUE IOHEXOL 350 MG/ML SOLN COMPARISON:  01/14/2022 FINDINGS: CT HEAD Brain: There is no acute intracranial hemorrhage, mass effect, or edema. Gray-white differentiation is preserved. There is no extra-axial fluid collection. Ventricles and sulci are stable in size and configuration. Minimal patchy hypoattenuation in the supratentorial white matter is nonspecific but may reflect mild chronic microvascular ischemic changes and sequelae of small infarcts seen on the prior MRI. Vascular: Better evaluated on CTA portion. Skull: Calvarium is unremarkable. Sinuses/Orbits: No acute finding. Other: None. Review of the MIP images confirms  the above findings CTA NECK Aortic arch: Stable appearance.  Great vessel origins are patent. Right carotid system: Patent. Minor mixed plaque at the ICA origin. No stenosis. Left carotid system: Patent common carotid with mild noncalcified plaque. There is unchanged mild stenosis at the origin. Mixed but primarily calcified plaque along  the proximal ICA, which now demonstrates patency post revascularization. There is less than 50% stenosis. Vertebral arteries: Patent and codominant. Plaque at the right vertebral origin causes moderate stenosis as before. Mild calcified plaque along the left V2 segment. There is extrinsic compression of bilateral V2 segments by osteophytes, greatest on the right at C5-C6. Skeleton: Cervical spine degenerative changes as before. Other neck: Unremarkable. Upper chest: Partially imaged catheter within the SVC. No apical lung mass. Review of the MIP images confirms the above findings CTA HEAD Suboptimal arterial evaluation due to venous enhancement. Anterior circulation: Intracranial right internal carotid artery is patent with calcified plaque with mild stenosis. Flow is not present within the proximal left intracranial internal carotid with mild plaque along the petrous portion. There is mixed plaque along the cavernous segment with luminal irregularity and marked stenosis. Caliber is improved at the level of the clinoid. As before, there is occlusion or high-grade stenosis of the left A1 ACA with distal filling likely via the anterior communicating artery. The anterior cerebral arteries are otherwise patent. Middle cerebral arteries are patent. Posterior circulation: Intracranial vertebral arteries are patent with calcified plaque causing mild stenosis. Basilar artery is patent. Major cerebellar artery origins are patent. Posterior cerebral arteries are patent. Posterior communicating arteries are not clearly identified. Venous sinuses: Patent as allowed by contrast bolus timing.  Review of the MIP images confirms the above findings IMPRESSION: No acute intracranial abnormality. Left cervical ICA is now patent with plaque causing less than 50% stenosis. Intracranially, there is no flow within the proximal left ICA with mixed plaque and luminal irregularity along the cavernous segment resulting in marked stenosis. Caliber improves at the level of the clinoid. Unchanged occlusion or high-grade stenosis of the left A1 ACA with distal filling likely via the anterior communicating artery. Electronically Signed   By: Macy Mis M.D.   On: 06/27/2022 10:29   CT ANGIO NECK W OR WO CONTRAST  Result Date: 06/27/2022 CLINICAL DATA:  Stenosis of artery EXAM: CT ANGIOGRAPHY HEAD AND NECK TECHNIQUE: Multidetector CT imaging of the head and neck was performed using the standard protocol during bolus administration of intravenous contrast. Multiplanar CT image reconstructions and MIPs were obtained to evaluate the vascular anatomy. Carotid stenosis measurements (when applicable) are obtained utilizing NASCET criteria, using the distal internal carotid diameter as the denominator. RADIATION DOSE REDUCTION: This exam was performed according to the departmental dose-optimization program which includes automated exposure control, adjustment of the mA and/or kV according to patient size and/or use of iterative reconstruction technique. CONTRAST:  50m OMNIPAQUE IOHEXOL 350 MG/ML SOLN COMPARISON:  01/14/2022 FINDINGS: CT HEAD Brain: There is no acute intracranial hemorrhage, mass effect, or edema. Gray-white differentiation is preserved. There is no extra-axial fluid collection. Ventricles and sulci are stable in size and configuration. Minimal patchy hypoattenuation in the supratentorial white matter is nonspecific but may reflect mild chronic microvascular ischemic changes and sequelae of small infarcts seen on the prior MRI. Vascular: Better evaluated on CTA portion. Skull: Calvarium is unremarkable.  Sinuses/Orbits: No acute finding. Other: None. Review of the MIP images confirms the above findings CTA NECK Aortic arch: Stable appearance.  Great vessel origins are patent. Right carotid system: Patent. Minor mixed plaque at the ICA origin. No stenosis. Left carotid system: Patent common carotid with mild noncalcified plaque. There is unchanged mild stenosis at the origin. Mixed but primarily calcified plaque along the proximal ICA, which now demonstrates patency post revascularization. There is less than 50% stenosis. Vertebral arteries: Patent and  codominant. Plaque at the right vertebral origin causes moderate stenosis as before. Mild calcified plaque along the left V2 segment. There is extrinsic compression of bilateral V2 segments by osteophytes, greatest on the right at C5-C6. Skeleton: Cervical spine degenerative changes as before. Other neck: Unremarkable. Upper chest: Partially imaged catheter within the SVC. No apical lung mass. Review of the MIP images confirms the above findings CTA HEAD Suboptimal arterial evaluation due to venous enhancement. Anterior circulation: Intracranial right internal carotid artery is patent with calcified plaque with mild stenosis. Flow is not present within the proximal left intracranial internal carotid with mild plaque along the petrous portion. There is mixed plaque along the cavernous segment with luminal irregularity and marked stenosis. Caliber is improved at the level of the clinoid. As before, there is occlusion or high-grade stenosis of the left A1 ACA with distal filling likely via the anterior communicating artery. The anterior cerebral arteries are otherwise patent. Middle cerebral arteries are patent. Posterior circulation: Intracranial vertebral arteries are patent with calcified plaque causing mild stenosis. Basilar artery is patent. Major cerebellar artery origins are patent. Posterior cerebral arteries are patent. Posterior communicating arteries are not  clearly identified. Venous sinuses: Patent as allowed by contrast bolus timing. Review of the MIP images confirms the above findings IMPRESSION: No acute intracranial abnormality. Left cervical ICA is now patent with plaque causing less than 50% stenosis. Intracranially, there is no flow within the proximal left ICA with mixed plaque and luminal irregularity along the cavernous segment resulting in marked stenosis. Caliber improves at the level of the clinoid. Unchanged occlusion or high-grade stenosis of the left A1 ACA with distal filling likely via the anterior communicating artery. Electronically Signed   By: Macy Mis M.D.   On: 06/27/2022 10:29    Labs:  CBC: Recent Labs    04/25/22 0827 05/24/22 0808 06/20/22 0754 07/16/22 0830  WBC 8.1 10.5 8.9 12.4*  HGB 11.9* 12.0* 12.0* 12.4*  HCT 36.9* 36.4* 36.5* 38.4*  PLT 148* 169 197 247    COAGS: Recent Labs    08/30/21 0241 01/14/22 1342 01/16/22 0355 07/16/22 0830  INR 1.5* 1.0 1.0 0.9  APTT  --  27  --   --     BMP: Recent Labs    03/28/22 0828 04/25/22 0827 05/24/22 0808 06/20/22 0754  NA 136 137 136 137  K 4.3 3.8 4.1 4.2  CL 104 102 104 104  CO2 '25 27 25 24  '$ GLUCOSE 133* 117* 162* 111*  BUN 17 15 25* 34*  CALCIUM 8.5* 8.8* 9.2 8.9  CREATININE 3.37* 3.37* 4.15* 4.48*  GFRNONAA 19* 19* 15* 13*    LIVER FUNCTION TESTS: Recent Labs    03/28/22 0828 04/25/22 0827 05/24/22 0808 06/20/22 0754  BILITOT 0.3 0.3 0.3 0.2*  AST 11* 10* 9* 9*  ALT '8 6 5 5  '$ ALKPHOS 92 95 86 87  PROT 5.6* 5.7* 5.8* 6.1*  ALBUMIN 3.3* 3.5 3.5 3.6    TUMOR MARKERS: No results for input(s): "AFPTM", "CEA", "CA199", "CHROMGRNA" in the last 8760 hours.  Assessment and Plan:  L Internal carotid stenosis with revascularization in NIR 01/18/22 Recent CTA showing Unchanged occlusion or high-grade stenosis of the left A1 ACA with distal filling likely via the anterior communicating artery. Scheduled now for Cerebral  arteriogram Risks and benefits of cerebral angiogram with intervention were discussed with the patient including, but not limited to bleeding, infection, vascular injury, contrast induced renal failure, stroke or even death.  This interventional  procedure involves the use of X-rays and because of the nature of the planned procedure, it is possible that we will have prolonged use of X-ray fluoroscopy.  Potential radiation risks to you include (but are not limited to) the following: - A slightly elevated risk for cancer  several years later in life. This risk is typically less than 0.5% percent. This risk is low in comparison to the Caleb incidence of human cancer, which is 33% for women and 50% for men according to the [place]. - Radiation induced injury can include skin redness, resembling a rash, tissue breakdown / ulcers and hair loss (which can be temporary or permanent).   The likelihood of either of these occurring depends on the difficulty of the procedure and whether you are sensitive to radiation due to previous procedures, disease, or genetic conditions.   IF your procedure requires a prolonged use of radiation, you will be notified and given written instructions for further action.  It is your responsibility to monitor the irradiated area for the 2 weeks following the procedure and to notify your physician if you are concerned that you have suffered a radiation induced injury.    All of the patient's questions were answered, patient is agreeable to proceed.  Consent signed and in chart.   Thank you for this interesting consult.  I greatly enjoyed meeting Caleb Simmons and look forward to participating in their care.  A copy of this report was sent to the requesting provider on this date.  Electronically Signed: Lavonia Drafts, PA-C 07/16/2022, 9:43 AM   I spent a total of    25 Minutes in face to face in clinical consultation, greater than 50% of which was  counseling/coordinating care for Cerebral arteriogram

## 2022-07-16 NOTE — Discharge Instructions (Signed)

## 2022-07-16 NOTE — Sedation Documentation (Signed)
Dr. Estanislado Pandy at bedside reviewing imaging and plan of care with pt and friend, [name].

## 2022-07-16 NOTE — Progress Notes (Signed)
Patient and friend was given discharge instructions. Both verbalized understanding. 

## 2022-07-16 NOTE — Procedures (Signed)
INR. Bilateral common carotid and bilateral vertebral artery arteriograms. Right radial approach. Findings. 1.  Severe high-grade stenosis of the distal cavernous segment of the left internal carotid artery secondary to the irregular atherosclerotic plaque. 2.  High-grade stenosis of the nondominant right vertebral artery at its origin 3.  Left anterior cerebral artery A1 segment not opacified.Arlean Hopping MD

## 2022-07-18 ENCOUNTER — Other Ambulatory Visit: Payer: Self-pay | Admitting: Nurse Practitioner

## 2022-07-18 ENCOUNTER — Inpatient Hospital Stay: Payer: Commercial Managed Care - PPO

## 2022-07-18 ENCOUNTER — Telehealth: Payer: Self-pay

## 2022-07-18 VITALS — BP 120/75 | HR 68 | Temp 98.7°F | Resp 18 | Ht 70.0 in | Wt 151.5 lb

## 2022-07-18 DIAGNOSIS — E8581 Light chain (AL) amyloidosis: Secondary | ICD-10-CM

## 2022-07-18 DIAGNOSIS — N186 End stage renal disease: Secondary | ICD-10-CM | POA: Diagnosis not present

## 2022-07-18 DIAGNOSIS — F419 Anxiety disorder, unspecified: Secondary | ICD-10-CM | POA: Diagnosis not present

## 2022-07-18 DIAGNOSIS — Z5112 Encounter for antineoplastic immunotherapy: Secondary | ICD-10-CM | POA: Diagnosis not present

## 2022-07-18 DIAGNOSIS — C9000 Multiple myeloma not having achieved remission: Secondary | ICD-10-CM | POA: Diagnosis present

## 2022-07-18 LAB — COMPREHENSIVE METABOLIC PANEL
ALT: <5 U/L (ref 0–44)
AST: 9 U/L — ABNORMAL LOW (ref 15–41)
Albumin: 3.5 g/dL (ref 3.5–5.0)
Alkaline Phosphatase: 83 U/L (ref 38–126)
Anion gap: 7 (ref 5–15)
BUN: 27 mg/dL — ABNORMAL HIGH (ref 8–23)
CO2: 24 mmol/L (ref 22–32)
Calcium: 8.7 mg/dL — ABNORMAL LOW (ref 8.9–10.3)
Chloride: 107 mmol/L (ref 98–111)
Creatinine, Ser: 3.82 mg/dL (ref 0.61–1.24)
GFR, Estimated: 16 mL/min — ABNORMAL LOW (ref >60)
Glucose, Bld: 142 mg/dL — ABNORMAL HIGH (ref 70–99)
Potassium: 4.3 mmol/L (ref 3.5–5.1)
Sodium: 138 mmol/L (ref 135–145)
Total Bilirubin: 0.3 mg/dL (ref 0.3–1.2)
Total Protein: 5.8 g/dL — ABNORMAL LOW (ref 6.5–8.1)

## 2022-07-18 LAB — CBC WITH DIFFERENTIAL/PLATELET
Abs Immature Granulocytes: 0.02 10*3/uL (ref 0.00–0.07)
Basophils Absolute: 0.1 10*3/uL (ref 0.0–0.1)
Basophils Relative: 1 %
Eosinophils Absolute: 0.3 10*3/uL (ref 0.0–0.5)
Eosinophils Relative: 3 %
HCT: 36.2 % — ABNORMAL LOW (ref 39.0–52.0)
Hemoglobin: 11.9 g/dL — ABNORMAL LOW (ref 13.0–17.0)
Immature Granulocytes: 0 %
Lymphocytes Relative: 21 %
Lymphs Abs: 1.8 10*3/uL (ref 0.7–4.0)
MCH: 30.7 pg (ref 26.0–34.0)
MCHC: 32.9 g/dL (ref 30.0–36.0)
MCV: 93.3 fL (ref 80.0–100.0)
Monocytes Absolute: 0.6 10*3/uL (ref 0.1–1.0)
Monocytes Relative: 7 %
Neutro Abs: 6 10*3/uL (ref 1.7–7.7)
Neutrophils Relative %: 68 %
Platelets: 184 10*3/uL (ref 150–400)
RBC: 3.88 MIL/uL — ABNORMAL LOW (ref 4.22–5.81)
RDW: 15 % (ref 11.5–15.5)
WBC: 8.8 10*3/uL (ref 4.0–10.5)
nRBC: 0 % (ref 0.0–0.2)

## 2022-07-18 MED ORDER — DEXAMETHASONE 4 MG PO TABS
20.0000 mg | ORAL_TABLET | Freq: Once | ORAL | Status: AC
  Administered 2022-07-18: 20 mg via ORAL
  Filled 2022-07-18: qty 5

## 2022-07-18 MED ORDER — ACETAMINOPHEN 325 MG PO TABS
650.0000 mg | ORAL_TABLET | Freq: Once | ORAL | Status: AC
  Administered 2022-07-18: 650 mg via ORAL
  Filled 2022-07-18: qty 2

## 2022-07-18 MED ORDER — DARATUMUMAB-HYALURONIDASE-FIHJ 1800-30000 MG-UT/15ML ~~LOC~~ SOLN
1800.0000 mg | Freq: Once | SUBCUTANEOUS | Status: AC
  Administered 2022-07-18: 1800 mg via SUBCUTANEOUS
  Filled 2022-07-18: qty 15

## 2022-07-18 MED ORDER — DIPHENHYDRAMINE HCL 25 MG PO CAPS
50.0000 mg | ORAL_CAPSULE | Freq: Once | ORAL | Status: AC
  Administered 2022-07-18: 50 mg via ORAL
  Filled 2022-07-18: qty 2

## 2022-07-18 NOTE — Patient Instructions (Signed)
[facility]   Discharge Instructions: Thank you for choosing [facility] to provide your oncology and hematology care.   If you have a lab appointment with the [facility], please go directly to the [facility] and check in at the registration area.   Wear comfortable clothing and clothing appropriate for easy access to any Portacath or PICC line.   We strive to give you quality time with your provider. You may need to reschedule your appointment if you arrive late (15 or more minutes).  Arriving late affects you and other patients whose appointments are after yours.  Also, if you miss three or more appointments without notifying the office, you may be dismissed from the clinic at the provider's discretion.      For prescription refill requests, have your pharmacy contact our office and allow 72 hours for refills to be completed.    Today you received the following chemotherapy and/or immunotherapy agents: Daratumumab hyaluronidase-fihj (Darzalex faspro)   To help prevent nausea and vomiting after your treatment, we encourage you to take your nausea medication as directed.  BELOW ARE SYMPTOMS THAT SHOULD BE REPORTED IMMEDIATELY: *FEVER GREATER THAN 100.4 F (38 C) OR HIGHER *CHILLS OR SWEATING *NAUSEA AND VOMITING THAT IS NOT CONTROLLED WITH YOUR NAUSEA MEDICATION *UNUSUAL SHORTNESS OF BREATH *UNUSUAL BRUISING OR BLEEDING *URINARY PROBLEMS (pain or burning when urinating, or frequent urination) *BOWEL PROBLEMS (unusual diarrhea, constipation, pain near the anus) TENDERNESS IN MOUTH AND THROAT WITH OR WITHOUT PRESENCE OF ULCERS (sore throat, sores in mouth, or a toothache) UNUSUAL RASH, SWELLING OR PAIN  UNUSUAL VAGINAL DISCHARGE OR ITCHING   Items with * indicate a potential emergency and should be followed up as soon as possible or go to the Emergency Department if any problems should occur.  Please show the CHEMOTHERAPY ALERT CARD or  IMMUNOTHERAPY ALERT CARD at check-in to the Emergency Department and triage nurse.  Should you have questions after your visit or need to cancel or reschedule your appointment, please contact [facility]  Dept: [phone]  and follow the prompts.  Office hours are 8:00 a.m. to 4:30 p.m. Monday - Friday. Please note that voicemails left after 4:00 p.m. may not be returned until the following business day.  We are closed weekends and major holidays. You have access to a nurse at all times for urgent questions. Please call the main number to the clinic Dept: [phone] and follow the prompts.   For any non-urgent questions, you may also contact your provider using MyChart. We now offer e-Visits for anyone 13 and older to request care online for non-urgent symptoms. For details visit [email or web address].   Also download the MyChart app! Go to the app store, search "MyChart", open the app, select , and log in with your MyChart username and password.  Masks are optional in the cancer centers. If you would like for your care team to wear a mask while they are taking care of you, please let them know. For doctor visits, patients may have with them one support person who is at least 72 years old. At this time, visitors are not allowed in the infusion area.

## 2022-07-18 NOTE — Telephone Encounter (Signed)
Critical lab value reported on dialysis ESRD patient of Scr+ 3.82 today.  Notified Dr. Burr Medico.

## 2022-07-23 ENCOUNTER — Other Ambulatory Visit: Payer: Self-pay | Admitting: Nurse Practitioner

## 2022-07-23 DIAGNOSIS — E854 Organ-limited amyloidosis: Secondary | ICD-10-CM

## 2022-07-23 DIAGNOSIS — Z515 Encounter for palliative care: Secondary | ICD-10-CM

## 2022-07-23 DIAGNOSIS — G893 Neoplasm related pain (acute) (chronic): Secondary | ICD-10-CM

## 2022-07-23 MED ORDER — OXYCODONE HCL 15 MG PO TABS: 15.0000 mg | ORAL_TABLET | Freq: Four times a day (QID) | ORAL | 0 refills | Status: DC | PRN

## 2022-07-23 MED ORDER — ALPRAZOLAM 1 MG PO TABS: 1.0000 mg | ORAL_TABLET | Freq: Three times a day (TID) | ORAL | 0 refills | Status: DC | PRN

## 2022-07-24 ENCOUNTER — Other Ambulatory Visit (HOSPITAL_COMMUNITY): Payer: Self-pay | Admitting: Interventional Radiology

## 2022-07-24 ENCOUNTER — Ambulatory Visit: Payer: Commercial Managed Care - PPO

## 2022-07-24 DIAGNOSIS — I771 Stricture of artery: Secondary | ICD-10-CM

## 2022-07-24 NOTE — Progress Notes (Deleted)
07/24/2022 Caleb Simmons 08/03/1951 [id number]   HPI:  Caleb Simmons is a 71 y.o. male patient of Dr Aundra Dubin, who presents today for a lipid clinic evaluation.  See pertinent past medical history below.   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     ALPRAZolam (XANAX) 1 MG tablet Take 1 tablet (1 mg total) by mouth every 8 (eight) hours as needed for anxiety or sleep. 90 tablet 0   aspirin 81 MG EC tablet Take 1 tablet (81 mg total) by mouth daily. Swallow whole. 30 tablet 11   cetirizine (ZYRTEC) 10 MG tablet Take 10 mg by mouth daily.     diclofenac Sodium (VOLTAREN) 1 % GEL Apply 1 Application topically 4 (four) times daily as needed (pain).     folic acid (FOLVITE) 1 MG tablet TAKE 2 TABLETS BY MOUTH EVERY DAY 60 tablet 0   Melatonin 10 MG TABS Take 10 mg by mouth at bedtime.     metoprolol succinate (TOPROL-XL) 25 MG 24 hr tablet TAKE 1/2 TABLET BY MOUTH EVERY DAY 15 tablet 4   midodrine (PROAMATINE) 5 MG tablet Take 1 tablet (5 mg total) by mouth 3 (three) times daily with meals. 270 tablet 3   mirtazapine (REMERON) 30 MG tablet Take 1 tablet (30 mg total) by mouth  at bedtime. 30 tablet 1   omeprazole (PRILOSEC) 20 MG capsule Take 20 mg by mouth daily as needed (acid reflux).     ondansetron (ZOFRAN) 4 MG tablet TAKE 1 TABLET BY MOUTH EVERY 8 HOURS AS NEEDED FOR NAUSEA AND VOMITING 45 tablet 2   [START ON 07/26/2022] oxyCODONE (ROXICODONE) 15 MG immediate release tablet Take 1 tablet (15 mg total) by mouth every 6 (six) hours as needed. 60 tablet 0   pantoprazole (PROTONIX) 40 MG tablet TAKE 1 TABLET BY MOUTH 2 TIMES DAILY BEFORE A MEAL 60 tablet 0   prochlorperazine (COMPAZINE) 5 MG tablet Take 1 tablet (5 mg total) by mouth every 6 (six) hours as needed for nausea or vomiting. (Patient taking differently: Take 5 mg by mouth every 4 (four) hours as needed for nausea or vomiting.) 30 tablet 2   senna-docusate (SENNA S) 8.6-50 MG tablet Take 1 tablet by mouth daily. 30 tablet 6   tamsulosin (FLOMAX) 0.4 MG CAPS capsule TAKE 1 CAPSULE BY MOUTH EVERY DAY 30 capsule 0   zolpidem (AMBIEN) 5 MG tablet TAKE 1 TABLET BY MOUTH AT BEDTIME AS NEEDED FOR SLEEP (Patient taking differently: Take 5 mg by mouth at bedtime.) 30 tablet 1   No current facility-administered medications for this visit.   Facility-Administered Medications Ordered in Other Visits  Medication Dose  Route Frequency Provider Last Rate Last Admin   heparin lock flush 100 unit/mL  500 Units Intracatheter Once Truitt Merle, MD       sodium chloride flush (NS) 0.9 % injection 10 mL  10 mL Intracatheter Once Truitt Merle, MD        No Known Allergies  No past medical history on file.  There were no vitals taken for this visit.   No problem-specific Assessment & Plan notes found for this encounter.   Tommy Medal PharmD CPP [facility] [address] [place], [place] [address] [phone]

## 2022-07-25 ENCOUNTER — Telehealth: Payer: Self-pay

## 2022-07-25 NOTE — Telephone Encounter (Signed)
Pt family member called and asked for a refill on oxycodone for pt, see orders, available for pickup tomorrow. Pt family verbalized understanding, no further needs.

## 2022-07-30 ENCOUNTER — Other Ambulatory Visit: Payer: Self-pay | Admitting: Nurse Practitioner

## 2022-07-30 ENCOUNTER — Other Ambulatory Visit (HOSPITAL_COMMUNITY): Payer: Self-pay | Admitting: Cardiology

## 2022-07-30 ENCOUNTER — Other Ambulatory Visit: Payer: Self-pay | Admitting: Hematology

## 2022-07-30 ENCOUNTER — Ambulatory Visit (HOSPITAL_COMMUNITY)
Admission: RE | Admit: 2022-07-30 | Discharge: 2022-07-30 | Disposition: A | Discharge status: Home / Self Care | Payer: Commercial Managed Care - PPO | Source: Ambulatory Visit | Attending: Cardiology | Admitting: Cardiology

## 2022-07-30 DIAGNOSIS — I358 Other nonrheumatic aortic valve disorders: Secondary | ICD-10-CM | POA: Insufficient documentation

## 2022-07-30 DIAGNOSIS — I517 Cardiomegaly: Secondary | ICD-10-CM | POA: Insufficient documentation

## 2022-07-30 DIAGNOSIS — I5022 Chronic systolic (congestive) heart failure: Secondary | ICD-10-CM | POA: Diagnosis present

## 2022-07-30 NOTE — Progress Notes (Signed)
  Echocardiogram 2D Echocardiogram has been performed.  Caleb Simmons F 07/30/2022, 8:56 AM

## 2022-07-31 ENCOUNTER — Encounter: Payer: Self-pay | Admitting: Hematology

## 2022-07-31 ENCOUNTER — Ambulatory Visit (HOSPITAL_COMMUNITY)
Admission: RE | Admit: 2022-07-31 | Discharge: 2022-07-31 | Disposition: A | Discharge status: Home / Self Care | Payer: Commercial Managed Care - PPO | Source: Ambulatory Visit | Attending: Interventional Radiology | Admitting: Interventional Radiology

## 2022-07-31 DIAGNOSIS — I771 Stricture of artery: Secondary | ICD-10-CM

## 2022-07-31 LAB — ECHOCARDIOGRAM COMPLETE
AR max vel: 2.09 cm2
AV Area VTI: 2.02 cm2
AV Area mean vel: 1.95 cm2
AV Mean grad: 5 mmHg
AV Peak grad: 9.9 mmHg
Ao pk vel: 1.57 m/s
Area-P 1/2: 2.99 cm2
Calc EF: 58.9 %
S' Lateral: 3 cm
Single Plane A2C EF: 56.4 %
Single Plane A4C EF: 57.8 %

## 2022-08-01 ENCOUNTER — Other Ambulatory Visit: Payer: Self-pay | Admitting: Nurse Practitioner

## 2022-08-02 HISTORY — PX: IR RADIOLOGIST EVAL & MGMT: IMG5224

## 2022-08-07 ENCOUNTER — Other Ambulatory Visit: Payer: Self-pay

## 2022-08-07 DIAGNOSIS — E854 Organ-limited amyloidosis: Secondary | ICD-10-CM

## 2022-08-07 DIAGNOSIS — G893 Neoplasm related pain (acute) (chronic): Secondary | ICD-10-CM

## 2022-08-07 DIAGNOSIS — Z515 Encounter for palliative care: Secondary | ICD-10-CM

## 2022-08-07 MED ORDER — OXYCODONE HCL 15 MG PO TABS: 15.0000 mg | ORAL_TABLET | Freq: Four times a day (QID) | ORAL | 0 refills | Status: DC | PRN

## 2022-08-07 NOTE — Telephone Encounter (Signed)
Pt called for refill on oxy, see new orders.

## 2022-08-13 ENCOUNTER — Encounter: Payer: Self-pay | Admitting: Pharmacist Clinician (PhC)/ Clinical Pharmacy Specialist

## 2022-08-13 ENCOUNTER — Ambulatory Visit (INDEPENDENT_AMBULATORY_CARE_PROVIDER_SITE_OTHER): Payer: Commercial Managed Care - PPO | Admitting: Pharmacist Clinician (PhC)/ Clinical Pharmacy Specialist

## 2022-08-13 DIAGNOSIS — E785 Hyperlipidemia, unspecified: Secondary | ICD-10-CM | POA: Diagnosis not present

## 2022-08-13 MED ORDER — EZETIMIBE 10 MG PO TABS: 10.0000 mg | ORAL_TABLET | Freq: Every day | ORAL | 3 refills | Status: DC

## 2022-08-13 NOTE — Progress Notes (Signed)
08/13/2022 JAWANN URBANI [birth year] [id number]   HPI:  Caleb Simmons is a 71 y.o. male patient of Dr Aundra Dubin, who presents today for a lipid clinic evaluation.  See pertinent past medical history below.  Patient had a stroke earlier this year and has also been undergoing treatment for AL amyloidosis.  He has been working until earlier this year and is currently on disability.  Unsure if he will be transitioned to Medicare supplemental next year.    Past Medical History: CVA 1/23 , acute ischemic MCA stroke, ICA occlusion  AL amyloidosis With cardiac, renal and neuro involvement - on Velcade and Cytoxan through Oncology  ESRD On dialysis MWF, hoping to ease off as amyloidosis improves 07/18/22 - GFR 16  BPH On tamsulosin  Hypotension  Using midodrine tid    Current Medications: no medications  Cholesterol Goals: LDL < 70   Intolerant/previously tried: atorvastatin - myalgias in hips, knees, back  Family history: brother died colon cancer; 1 child no issues  Diet: appetite up and down; steroids with chemo; now at once monthly treatments   Exercise:  no regular exercise, work in his garage, mow the yard (riding); doesn't sit still  Labs:  1/23: TC 239, TG 535, HDL 28, LDL(d) 85.5 (no meds)   Current Outpatient Medications  Medication Sig Dispense Refill   acetaminophen (TYLENOL) 500 MG tablet Take 1,000 mg by mouth every 6 (six) hours as needed for moderate pain.     aspirin 81 MG EC tablet Take 1 tablet (81 mg total) by mouth daily. Swallow whole. 30 tablet 11   cetirizine (ZYRTEC) 10 MG tablet Take 10 mg by mouth daily.     diclofenac Sodium (VOLTAREN) 1 % GEL Apply 1 Application topically 4 (four) times daily as needed (pain).     ezetimibe (ZETIA) 10 MG tablet Take 1 tablet (10 mg total) by mouth daily. 90 tablet 3   folic acid (FOLVITE) 1 MG tablet TAKE 2 TABLETS BY MOUTH EVERY DAY 60 tablet 0   Melatonin 10 MG TABS Take 10 mg by mouth at bedtime.     metoprolol succinate  (TOPROL-XL) 25 MG 24 hr tablet TAKE 1/2 TABLET BY MOUTH EVERY DAY 15 tablet 4   midodrine (PROAMATINE) 5 MG tablet Take 1 tablet (5 mg total) by mouth 3 (three) times daily with meals. 270 tablet 3   mirtazapine (REMERON) 30 MG tablet TAKE 1 TABLET BY MOUTH AT BEDTIME 30 tablet 1   ondansetron (ZOFRAN) 4 MG tablet TAKE 1 TABLET BY MOUTH EVERY 8 HOURS AS NEEDED FOR NAUSEA AND VOMITING 45 tablet 2   oxyCODONE (ROXICODONE) 15 MG immediate release tablet Take 1 tablet (15 mg total) by mouth every 6 (six) hours as needed. 60 tablet 0   pantoprazole (PROTONIX) 40 MG tablet TAKE 1 TABLET BY MOUTH 2 TIMES DAILY BEFORE A MEAL 60 tablet 0   prochlorperazine (COMPAZINE) 5 MG tablet Take 1 tablet (5 mg total) by mouth every 6 (six) hours as needed for nausea or vomiting. (Patient taking differently: Take 5 mg by mouth every 4 (four) hours as needed for nausea or vomiting.) 30 tablet 2   senna-docusate (SENNA S) 8.6-50 MG tablet Take 1 tablet by mouth daily. 30 tablet 6   tamsulosin (FLOMAX) 0.4 MG CAPS capsule TAKE 1 CAPSULE BY MOUTH EVERY DAY 30 capsule 0   zolpidem (AMBIEN) 5 MG tablet TAKE 1 TABLET BY MOUTH AT BEDTIME AS NEEDED FOR SLEEP 30 tablet 1   ALPRAZolam Duanne Moron)  1 MG tablet Take 1 tablet (1 mg total) by mouth every 8 (eight) hours as needed for anxiety or sleep. 90 tablet 0   No current facility-administered medications for this visit.   Facility-Administered Medications Ordered in Other Visits  Medication Dose Route Frequency Provider Last Rate Last Admin   heparin lock flush 100 unit/mL  500 Units Intracatheter Once Truitt Merle, MD       sodium chloride flush (NS) 0.9 % injection 10 mL  10 mL Intracatheter Once Truitt Merle, MD        No Known Allergies  History reviewed. No pertinent past medical history.  There were no vitals taken for this visit.   Hyperlipidemia Patient with hyperlipidemia and prior stroke.  LDL not to goal of < 70 and was unable to tolerate atorvastatin due to myalgias.   Reviewed options for lowering LDL cholesterol, including trial of second statin, ezetimibe, PCSK-9 inhibitors, bempedoic acid and inclisiran.  Discussed mechanisms of action, dosing, side effects and potential decreases in LDL cholesterol.  Also reviewed cost information and potential options for patient assistance.  Answered all patient questions.  Based on this information, patient would prefer to start ezetimibe 10 mg daily.  He is aware that if he were to need one of the newer medications he will have to try a second statin, preferably rosuvastatin.  Will repeat lipid labs in 3 months and make further decisions based on that lab.   Tommy Medal PharmD CPP [facility] [address] [place], [place] [address] [phone]

## 2022-08-13 NOTE — Patient Instructions (Signed)
Your Results:             Your most recent labs Goal  Total Cholesterol 239 < 200  Triglycerides 535 < 150  HDL (happy/good cholesterol) 28 > 40  LDL (lousy/bad cholesterol) 85.5 < 70   Medication changes:  Start ezetimibe 10 mg once daily (when you get back from vacation)  Lab orders:  We want to repeat labs after 2-3 months.  We will send you a lab order to remind you once we get closer to that time.     Thank you for choosing [facility]

## 2022-08-13 NOTE — Assessment & Plan Note (Signed)
Patient with hyperlipidemia and prior stroke.  LDL not to goal of < 70 and was unable to tolerate atorvastatin due to myalgias.  Reviewed options for lowering LDL cholesterol, including trial of second statin, ezetimibe, PCSK-9 inhibitors, bempedoic acid and inclisiran.  Discussed mechanisms of action, dosing, side effects and potential decreases in LDL cholesterol.  Also reviewed cost information and potential options for patient assistance.  Answered all patient questions.  Based on this information, patient would prefer to start ezetimibe 10 mg daily.  He is aware that if he were to need one of the newer medications he will have to try a second statin, preferably rosuvastatin.  Will repeat lipid labs in 3 months and make further decisions based on that lab.

## 2022-08-15 ENCOUNTER — Telehealth: Payer: Self-pay

## 2022-08-15 ENCOUNTER — Inpatient Hospital Stay: Payer: Commercial Managed Care - PPO | Attending: Hematology

## 2022-08-15 ENCOUNTER — Other Ambulatory Visit: Payer: Self-pay

## 2022-08-15 ENCOUNTER — Encounter: Payer: Self-pay | Admitting: Nurse Practitioner

## 2022-08-15 ENCOUNTER — Inpatient Hospital Stay: Payer: Commercial Managed Care - PPO

## 2022-08-15 ENCOUNTER — Inpatient Hospital Stay (HOSPITAL_BASED_OUTPATIENT_CLINIC_OR_DEPARTMENT_OTHER): Payer: Commercial Managed Care - PPO | Admitting: Nurse Practitioner

## 2022-08-15 VITALS — BP 117/98 | HR 68 | Temp 97.9°F | Resp 16 | Ht 70.0 in | Wt 149.8 lb

## 2022-08-15 DIAGNOSIS — G893 Neoplasm related pain (acute) (chronic): Secondary | ICD-10-CM | POA: Diagnosis not present

## 2022-08-15 DIAGNOSIS — Z515 Encounter for palliative care: Secondary | ICD-10-CM | POA: Insufficient documentation

## 2022-08-15 DIAGNOSIS — Z5112 Encounter for antineoplastic immunotherapy: Secondary | ICD-10-CM | POA: Diagnosis present

## 2022-08-15 DIAGNOSIS — R52 Pain, unspecified: Secondary | ICD-10-CM | POA: Diagnosis not present

## 2022-08-15 DIAGNOSIS — K5900 Constipation, unspecified: Secondary | ICD-10-CM | POA: Insufficient documentation

## 2022-08-15 DIAGNOSIS — F419 Anxiety disorder, unspecified: Secondary | ICD-10-CM | POA: Insufficient documentation

## 2022-08-15 DIAGNOSIS — E8581 Light chain (AL) amyloidosis: Secondary | ICD-10-CM | POA: Diagnosis present

## 2022-08-15 DIAGNOSIS — C9000 Multiple myeloma not having achieved remission: Secondary | ICD-10-CM | POA: Diagnosis present

## 2022-08-15 LAB — COMPREHENSIVE METABOLIC PANEL
ALT: 5 U/L (ref 0–44)
AST: 8 U/L — ABNORMAL LOW (ref 15–41)
Albumin: 3.8 g/dL (ref 3.5–5.0)
Alkaline Phosphatase: 84 U/L (ref 38–126)
Anion gap: 7 (ref 5–15)
BUN: 32 mg/dL — ABNORMAL HIGH (ref 8–23)
CO2: 25 mmol/L (ref 22–32)
Calcium: 9 mg/dL (ref 8.9–10.3)
Chloride: 107 mmol/L (ref 98–111)
Creatinine, Ser: 4.41 mg/dL (ref 0.61–1.24)
GFR, Estimated: 13 mL/min — ABNORMAL LOW (ref >60)
Glucose, Bld: 119 mg/dL — ABNORMAL HIGH (ref 70–99)
Potassium: 4.3 mmol/L (ref 3.5–5.1)
Sodium: 139 mmol/L (ref 135–145)
Total Bilirubin: 0.2 mg/dL — ABNORMAL LOW (ref 0.3–1.2)
Total Protein: 6 g/dL — ABNORMAL LOW (ref 6.5–8.1)

## 2022-08-15 LAB — CBC WITH DIFFERENTIAL/PLATELET
Abs Immature Granulocytes: 0.02 10*3/uL (ref 0.00–0.07)
Basophils Absolute: 0.1 10*3/uL (ref 0.0–0.1)
Basophils Relative: 1 %
Eosinophils Absolute: 0.3 10*3/uL (ref 0.0–0.5)
Eosinophils Relative: 4 %
HCT: 36.3 % — ABNORMAL LOW (ref 39.0–52.0)
Hemoglobin: 12.1 g/dL — ABNORMAL LOW (ref 13.0–17.0)
Immature Granulocytes: 0 %
Lymphocytes Relative: 26 %
Lymphs Abs: 2.1 10*3/uL (ref 0.7–4.0)
MCH: 30.7 pg (ref 26.0–34.0)
MCHC: 33.3 g/dL (ref 30.0–36.0)
MCV: 92.1 fL (ref 80.0–100.0)
Monocytes Absolute: 0.6 10*3/uL (ref 0.1–1.0)
Monocytes Relative: 7 %
Neutro Abs: 5.1 10*3/uL (ref 1.7–7.7)
Neutrophils Relative %: 62 %
Platelets: 190 10*3/uL (ref 150–400)
RBC: 3.94 MIL/uL — ABNORMAL LOW (ref 4.22–5.81)
RDW: 15.5 % (ref 11.5–15.5)
WBC: 8.2 10*3/uL (ref 4.0–10.5)
nRBC: 0 % (ref 0.0–0.2)

## 2022-08-15 MED ORDER — DEXAMETHASONE 4 MG PO TABS
20.0000 mg | ORAL_TABLET | Freq: Once | ORAL | Status: AC
  Administered 2022-08-15: 20 mg via ORAL
  Filled 2022-08-15: qty 5

## 2022-08-15 MED ORDER — ACETAMINOPHEN 325 MG PO TABS
650.0000 mg | ORAL_TABLET | Freq: Once | ORAL | Status: AC
  Administered 2022-08-15: 650 mg via ORAL
  Filled 2022-08-15: qty 2

## 2022-08-15 MED ORDER — DIPHENHYDRAMINE HCL 25 MG PO CAPS
50.0000 mg | ORAL_CAPSULE | Freq: Once | ORAL | Status: AC
  Administered 2022-08-15: 50 mg via ORAL
  Filled 2022-08-15: qty 2

## 2022-08-15 MED ORDER — DARATUMUMAB-HYALURONIDASE-FIHJ 1800-30000 MG-UT/15ML ~~LOC~~ SOLN
1800.0000 mg | Freq: Once | SUBCUTANEOUS | Status: AC
  Administered 2022-08-15: 1800 mg via SUBCUTANEOUS
  Filled 2022-08-15: qty 15

## 2022-08-15 NOTE — Telephone Encounter (Signed)
Critical lab value reported Scr+4.41  Renal dialysis pt notified Dr. Burr Medico

## 2022-08-15 NOTE — Progress Notes (Signed)
Per Dr Burr Medico, ok to proceed with therapy today with creat 4.41 mg/dL

## 2022-08-15 NOTE — Patient Instructions (Signed)
[facility]   Discharge Instructions: Thank you for choosing [facility] to provide your oncology and hematology care.   If you have a lab appointment with the [facility], please go directly to the [facility] and check in at the registration area.   Wear comfortable clothing and clothing appropriate for easy access to any Portacath or PICC line.   We strive to give you quality time with your provider. You may need to reschedule your appointment if you arrive late (15 or more minutes).  Arriving late affects you and other patients whose appointments are after yours.  Also, if you miss three or more appointments without notifying the office, you may be dismissed from the clinic at the provider's discretion.      For prescription refill requests, have your pharmacy contact our office and allow 72 hours for refills to be completed.    Today you received the following chemotherapy and/or immunotherapy agents: Daratumumab hyaluronidase-fihj (Darzalex faspro)   To help prevent nausea and vomiting after your treatment, we encourage you to take your nausea medication as directed.  BELOW ARE SYMPTOMS THAT SHOULD BE REPORTED IMMEDIATELY: *FEVER GREATER THAN 100.4 F (38 C) OR HIGHER *CHILLS OR SWEATING *NAUSEA AND VOMITING THAT IS NOT CONTROLLED WITH YOUR NAUSEA MEDICATION *UNUSUAL SHORTNESS OF BREATH *UNUSUAL BRUISING OR BLEEDING *URINARY PROBLEMS (pain or burning when urinating, or frequent urination) *BOWEL PROBLEMS (unusual diarrhea, constipation, pain near the anus) TENDERNESS IN MOUTH AND THROAT WITH OR WITHOUT PRESENCE OF ULCERS (sore throat, sores in mouth, or a toothache) UNUSUAL RASH, SWELLING OR PAIN  UNUSUAL VAGINAL DISCHARGE OR ITCHING   Items with * indicate a potential emergency and should be followed up as soon as possible or go to the Emergency Department if any problems should occur.  Please show the CHEMOTHERAPY ALERT CARD or  IMMUNOTHERAPY ALERT CARD at check-in to the Emergency Department and triage nurse.  Should you have questions after your visit or need to cancel or reschedule your appointment, please contact [facility]  Dept: [phone]  and follow the prompts.  Office hours are 8:00 a.m. to 4:30 p.m. Monday - Friday. Please note that voicemails left after 4:00 p.m. may not be returned until the following business day.  We are closed weekends and major holidays. You have access to a nurse at all times for urgent questions. Please call the main number to the clinic Dept: [phone] and follow the prompts.   For any non-urgent questions, you may also contact your provider using MyChart. We now offer e-Visits for anyone 30 and older to request care online for non-urgent symptoms. For details visit [email or web address].   Also download the MyChart app! Go to the app store, search "MyChart", open the app, select [facility], and log in with your MyChart username and password.  Masks are optional in the cancer centers. If you would like for your care team to wear a mask while they are taking care of you, please let them know. For doctor visits, patients may have with them one support person who is at least 72 years old. At this time, visitors are not allowed in the infusion area.

## 2022-08-16 ENCOUNTER — Telehealth: Payer: Self-pay

## 2022-08-16 ENCOUNTER — Encounter: Payer: Self-pay | Admitting: Hematology

## 2022-08-16 LAB — KAPPA/LAMBDA LIGHT CHAINS
Kappa free light chain: 36.5 mg/L — ABNORMAL HIGH (ref 3.3–19.4)
Kappa, lambda light chain ratio: 1.82 — ABNORMAL HIGH (ref 0.26–1.65)
Lambda free light chains: 20.1 mg/L (ref 5.7–26.3)

## 2022-08-16 NOTE — Telephone Encounter (Signed)
Pt significant other called reporting that pt will be out of oxycodone while on vacation, pt notified to call us and the script can be sent to a pharmacy close by, no further questions.

## 2022-08-16 NOTE — Progress Notes (Signed)
[facility]  [phone] [phone]) [phone]   Name: Caleb Simmons Date: 08/16/2022 MRN: [id number]  DOB: 1951/03/24  Patient Care Team: Pcp, No as PCP - General Buntin, Lavonda Jumbo, RN (Inactive) as Registered Nurse Pickenpack-Cousar, Carlena Sax, NP as Nurse Practitioner (Nurse Practitioner)   INTERVAL HISTORY: Caleb Simmons is a 71 y.o. 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  Recently admitted and discharged on 01/20/22 after receiving treatment for left MCA CVA. Palliative following for ongoing goals of care discussions and symptom management.   SOCIAL HISTORY:     reports that he has been smoking cigarettes. He has a 6.25 pack-year smoking history. He has never used smokeless tobacco.  ADVANCE DIRECTIVES:  Patient reports completed document.  Son Caleb, Simmons. is his healthcare power of attorney.  CODE STATUS: Full code  PAST MEDICAL HISTORY:History reviewed. No pertinent past medical history.  ALLERGIES:  has No Known Allergies.  MEDICATIONS:  Current Outpatient Medications  Medication Sig Dispense Refill   acetaminophen (TYLENOL) 500 MG tablet Take 1,000 mg by mouth every 6 (six) hours as needed for moderate pain.     ALPRAZolam (XANAX) 1 MG tablet Take 1 tablet (1 mg total) by mouth every 8 (eight) hours as needed for anxiety or sleep. 90 tablet 0   aspirin 81 MG EC tablet Take 1 tablet (81 mg total) by mouth daily. Swallow whole. 30 tablet 11   cetirizine (ZYRTEC) 10 MG tablet Take 10 mg by mouth daily.     diclofenac Sodium (VOLTAREN) 1 % GEL Apply 1 Application topically 4 (four) times daily as needed (pain).     ezetimibe (ZETIA) 10 MG tablet Take 1 tablet (10 mg total) by mouth daily. 90 tablet 3   folic acid (FOLVITE) 1 MG tablet TAKE 2 TABLETS BY MOUTH EVERY DAY 60 tablet 0   Melatonin 10  MG TABS Take 10 mg by mouth at bedtime.     metoprolol succinate (TOPROL-XL) 25 MG 24 hr tablet TAKE 1/2 TABLET BY MOUTH EVERY DAY 15 tablet 4   midodrine (PROAMATINE) 5 MG tablet Take 1 tablet (5 mg total) by mouth 3 (three) times daily with meals. 270 tablet 3   mirtazapine (REMERON) 30 MG tablet TAKE 1 TABLET BY MOUTH AT BEDTIME 30 tablet 1   ondansetron (ZOFRAN) 4 MG tablet TAKE 1 TABLET BY MOUTH EVERY 8 HOURS AS NEEDED FOR NAUSEA AND VOMITING 45 tablet 2   oxyCODONE (ROXICODONE) 15 MG immediate release tablet Take 1 tablet (15 mg total) by mouth every 6 (six) hours as needed. 60 tablet 0   pantoprazole (PROTONIX) 40 MG tablet TAKE 1 TABLET BY MOUTH 2 TIMES DAILY BEFORE A MEAL 60 tablet 0   prochlorperazine (COMPAZINE) 5 MG tablet Take 1 tablet (5 mg total) by mouth every 6 (six) hours as needed for nausea or vomiting. (Patient taking differently: Take 5 mg by mouth every 4 (four) hours as needed for nausea or vomiting.) 30 tablet 2   senna-docusate (SENNA S) 8.6-50 MG tablet Take 1 tablet by mouth daily. 30 tablet 6   tamsulosin (FLOMAX) 0.4 MG CAPS capsule TAKE 1 CAPSULE BY MOUTH EVERY DAY 30 capsule 0   zolpidem (AMBIEN) 5 MG tablet TAKE 1 TABLET BY MOUTH AT BEDTIME AS NEEDED FOR SLEEP 30 tablet 1   No current facility-administered medications for this visit.   Facility-Administered  Medications Ordered in Other Visits  Medication Dose Route Frequency Provider Last Rate Last Admin   heparin lock flush 100 unit/mL  500 Units Intracatheter Once Truitt Merle, MD       sodium chloride flush (NS) 0.9 % injection 10 mL  10 mL Intracatheter Once Truitt Merle, MD        VITAL SIGNS: There were no vitals taken for this visit. There were no vitals filed for this visit.    Estimated body mass index is 21.49 kg/m as calculated from the following:   Height as of an earlier encounter on 08/15/22: '5\' 10"'$  (1.778 m).   Weight as of an earlier encounter on 08/15/22: 149 lb 12.8 oz (67.9 kg).   PERFORMANCE  STATUS (ECOG) : 2 - Symptomatic, <50% confined to bed   IMPRESSION:  I saw [name] today during his infusion. Sitting upright. No acute distress noted. States he has been feeling well. Shares his excitement that he is going to the beach with family for a week and looking forward to this. His Nephrologist has arranged for him to have 2 dialysis treatments while there at their local center to prevent missing treatments.   Pain Caleb Simmons states his pain is well controlled.  He is currently taking Oxy IR as needed.  Pain fluctuates depending on levels of activity. Does not need as often. Denies increase or changes to pain.   No adjustments needed. Will continue to closely monitor. has decreased over the past week.     Decreased Appetite Tolerating Remeron 30 mg. Appetite has improved. Does decrease some around treatment days. Is appreciative of his appetite and ability to eat foods he enjoys. His weight is trending down. We will continue to closely monitor.   Constipation Is actively taking Miralax daily for bowel regimen with Senna. Improving.   4.  Anxiety [name] complains of ongoing anxiety.  Anxiety is now much more controlled on current regimen.  He is taking Xanax as prescribed confirming he is not taking as often as he was previously.  We will continue to closely monitor.     PLAN: Oxy IR 5-10 mg every 4-6 hours. Will continue to monitor closely. If pain continues will consider adding a long-acting regimen. Some concerns with hypotension.   Will closely monitor appetite and make adjustments if no increase or further weight decline. Education provided on small frequent meals. Careful considerations given he is an active dialysis patient. Recommendations for Neproshakes for added nourishment. Currently tolerating Remeron. Weight trending down.   Xanax 1 mg every 8 hours as needed for anxiety/sleep Miralax twice daily Senna-S daily Zofran as needed for nausea.  I will plan to see him  back in 3-4 weeks in collaboration with his other oncology appointments.   Patient expressed understanding and was in agreement with this plan. He also understands that He can call the clinic at any time with any questions, concerns, or complaints.     Any controlled substances utilized were prescribed in the context of palliative care. PDMP has been reviewed.    Time Total: 35 min   Visit consisted of counseling and education dealing with the complex and emotionally intense issues of symptom management and palliative care in the setting of serious and potentially life-threatening illness.Greater than 50%  of this time was spent counseling and coordinating care related to the above assessment and plan.  Alda Lea, AGPCNP-BC  Palliative Medicine Team/[facility]

## 2022-08-27 ENCOUNTER — Telehealth: Payer: Self-pay

## 2022-08-27 ENCOUNTER — Other Ambulatory Visit: Payer: Self-pay

## 2022-08-27 DIAGNOSIS — Z515 Encounter for palliative care: Secondary | ICD-10-CM

## 2022-08-27 DIAGNOSIS — G893 Neoplasm related pain (acute) (chronic): Secondary | ICD-10-CM

## 2022-08-27 DIAGNOSIS — E854 Organ-limited amyloidosis: Secondary | ICD-10-CM

## 2022-08-27 MED ORDER — ALPRAZOLAM 1 MG PO TABS: 1.0000 mg | ORAL_TABLET | Freq: Three times a day (TID) | ORAL | 0 refills | Status: DC | PRN

## 2022-08-27 MED ORDER — OXYCODONE HCL 15 MG PO TABS: 15.0000 mg | ORAL_TABLET | Freq: Four times a day (QID) | ORAL | 0 refills | Status: DC | PRN

## 2022-08-27 NOTE — Telephone Encounter (Signed)
RN Called pt SO to confirm pharmacy, see new orders for med refill

## 2022-08-28 ENCOUNTER — Other Ambulatory Visit: Payer: Self-pay | Admitting: Hematology

## 2022-08-28 ENCOUNTER — Other Ambulatory Visit (HOSPITAL_COMMUNITY): Payer: Self-pay | Admitting: Cardiology

## 2022-09-01 ENCOUNTER — Other Ambulatory Visit: Payer: Self-pay | Admitting: Hematology

## 2022-09-01 DIAGNOSIS — E8581 Light chain (AL) amyloidosis: Secondary | ICD-10-CM

## 2022-09-02 ENCOUNTER — Encounter: Payer: Self-pay | Admitting: Hematology

## 2022-09-02 ENCOUNTER — Telehealth: Payer: Self-pay | Admitting: *Deleted

## 2022-09-02 NOTE — Telephone Encounter (Signed)
Connected with Hyman Hopes Frisch significant other, Loni Beckwith [phone]) regarding [place] cover sheet and [place] received 08/16/2022 currently next in queue to process for caregiver FMLA.  No form was received with this information.   "I do not need this anymore.  They let me go.  Was told I do not have a job anymore with "[employer]".  I have not returned to work since July 2023, I quit.  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  They try to say I can use my left arm and withholding money.  I was given a leave form but do not read well to know or tell you what it said.  We are making out okay.  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 Rueb."  Advised to call if any paperwork needed in the future.

## 2022-09-09 ENCOUNTER — Telehealth: Payer: Self-pay

## 2022-09-09 ENCOUNTER — Other Ambulatory Visit: Payer: Self-pay

## 2022-09-09 DIAGNOSIS — Z515 Encounter for palliative care: Secondary | ICD-10-CM

## 2022-09-09 DIAGNOSIS — G893 Neoplasm related pain (acute) (chronic): Secondary | ICD-10-CM

## 2022-09-09 DIAGNOSIS — E854 Organ-limited amyloidosis: Secondary | ICD-10-CM

## 2022-09-09 MED ORDER — OXYCODONE HCL 15 MG PO TABS: 15.0000 mg | ORAL_TABLET | Freq: Four times a day (QID) | ORAL | 0 refills | Status: DC | PRN

## 2022-09-09 NOTE — Telephone Encounter (Signed)
Pt SO, [name] called reporting pt is out of pain meds, see new orders.

## 2022-09-12 ENCOUNTER — Telehealth: Payer: Self-pay

## 2022-09-12 ENCOUNTER — Encounter: Payer: Self-pay | Admitting: Hematology

## 2022-09-12 ENCOUNTER — Inpatient Hospital Stay: Payer: Commercial Managed Care - PPO | Admitting: Nurse Practitioner

## 2022-09-12 ENCOUNTER — Inpatient Hospital Stay: Payer: Commercial Managed Care - PPO

## 2022-09-12 ENCOUNTER — Other Ambulatory Visit: Payer: Self-pay

## 2022-09-12 ENCOUNTER — Inpatient Hospital Stay: Payer: Commercial Managed Care - PPO | Attending: Hematology | Admitting: Hematology

## 2022-09-12 VITALS — BP 136/68 | HR 77 | Temp 98.5°F | Resp 18 | Ht 70.0 in | Wt 152.3 lb

## 2022-09-12 DIAGNOSIS — N186 End stage renal disease: Secondary | ICD-10-CM | POA: Insufficient documentation

## 2022-09-12 DIAGNOSIS — Z5112 Encounter for antineoplastic immunotherapy: Secondary | ICD-10-CM | POA: Insufficient documentation

## 2022-09-12 DIAGNOSIS — D509 Iron deficiency anemia, unspecified: Secondary | ICD-10-CM | POA: Insufficient documentation

## 2022-09-12 DIAGNOSIS — E8581 Light chain (AL) amyloidosis: Secondary | ICD-10-CM

## 2022-09-12 DIAGNOSIS — I509 Heart failure, unspecified: Secondary | ICD-10-CM | POA: Insufficient documentation

## 2022-09-12 DIAGNOSIS — Z992 Dependence on renal dialysis: Secondary | ICD-10-CM | POA: Diagnosis not present

## 2022-09-12 DIAGNOSIS — C9000 Multiple myeloma not having achieved remission: Secondary | ICD-10-CM | POA: Insufficient documentation

## 2022-09-12 DIAGNOSIS — I951 Orthostatic hypotension: Secondary | ICD-10-CM | POA: Diagnosis not present

## 2022-09-12 LAB — CBC WITH DIFFERENTIAL/PLATELET
Abs Immature Granulocytes: 0.03 10*3/uL (ref 0.00–0.07)
Basophils Absolute: 0.1 10*3/uL (ref 0.0–0.1)
Basophils Relative: 1 %
Eosinophils Absolute: 0.3 10*3/uL (ref 0.0–0.5)
Eosinophils Relative: 4 %
HCT: 31 % — ABNORMAL LOW (ref 39.0–52.0)
Hemoglobin: 10.1 g/dL — ABNORMAL LOW (ref 13.0–17.0)
Immature Granulocytes: 0 %
Lymphocytes Relative: 22 %
Lymphs Abs: 1.9 10*3/uL (ref 0.7–4.0)
MCH: 30.1 pg (ref 26.0–34.0)
MCHC: 32.6 g/dL (ref 30.0–36.0)
MCV: 92.3 fL (ref 80.0–100.0)
Monocytes Absolute: 0.6 10*3/uL (ref 0.1–1.0)
Monocytes Relative: 7 %
Neutro Abs: 5.9 10*3/uL (ref 1.7–7.7)
Neutrophils Relative %: 66 %
Platelets: 210 10*3/uL (ref 150–400)
RBC: 3.36 MIL/uL — ABNORMAL LOW (ref 4.22–5.81)
RDW: 15.8 % — ABNORMAL HIGH (ref 11.5–15.5)
WBC: 8.8 10*3/uL (ref 4.0–10.5)
nRBC: 0 % (ref 0.0–0.2)

## 2022-09-12 LAB — COMPREHENSIVE METABOLIC PANEL
ALT: 5 U/L (ref 0–44)
AST: 8 U/L — ABNORMAL LOW (ref 15–41)
Albumin: 3.4 g/dL — ABNORMAL LOW (ref 3.5–5.0)
Alkaline Phosphatase: 80 U/L (ref 38–126)
Anion gap: 6 (ref 5–15)
BUN: 31 mg/dL — ABNORMAL HIGH (ref 8–23)
CO2: 25 mmol/L (ref 22–32)
Calcium: 8.5 mg/dL — ABNORMAL LOW (ref 8.9–10.3)
Chloride: 106 mmol/L (ref 98–111)
Creatinine, Ser: 4.41 mg/dL (ref 0.61–1.24)
GFR, Estimated: 13 mL/min — ABNORMAL LOW (ref >60)
Glucose, Bld: 124 mg/dL — ABNORMAL HIGH (ref 70–99)
Potassium: 4.7 mmol/L (ref 3.5–5.1)
Sodium: 137 mmol/L (ref 135–145)
Total Bilirubin: 0.2 mg/dL — ABNORMAL LOW (ref 0.3–1.2)
Total Protein: 5.6 g/dL — ABNORMAL LOW (ref 6.5–8.1)

## 2022-09-12 MED ORDER — ACETAMINOPHEN 325 MG PO TABS
650.0000 mg | ORAL_TABLET | Freq: Once | ORAL | Status: AC
  Administered 2022-09-12: 650 mg via ORAL
  Filled 2022-09-12: qty 2

## 2022-09-12 MED ORDER — DARATUMUMAB-HYALURONIDASE-FIHJ 1800-30000 MG-UT/15ML ~~LOC~~ SOLN
1800.0000 mg | Freq: Once | SUBCUTANEOUS | Status: AC
  Administered 2022-09-12: 1800 mg via SUBCUTANEOUS
  Filled 2022-09-12: qty 15

## 2022-09-12 MED ORDER — DIPHENHYDRAMINE HCL 25 MG PO CAPS
50.0000 mg | ORAL_CAPSULE | Freq: Once | ORAL | Status: AC
  Administered 2022-09-12: 50 mg via ORAL
  Filled 2022-09-12: qty 2

## 2022-09-12 MED ORDER — DEXAMETHASONE 4 MG PO TABS
20.0000 mg | ORAL_TABLET | Freq: Once | ORAL | Status: AC
  Administered 2022-09-12: 20 mg via ORAL
  Filled 2022-09-12: qty 5

## 2022-09-12 NOTE — Progress Notes (Signed)
[facility]   Telephone:(336) [phone] Fax:(336) [phone]   Clinic Follow up Note   Patient Care Team: Pcp, No as PCP - General Buntin, Lavonda Jumbo, RN (Inactive) as Registered Nurse [place], Carlena Sax, NP as Nurse Practitioner (Nurse Practitioner)  Date of Service:  09/12/2022  CHIEF COMPLAINT: f/u of multiorgan AL amyloidosis  CURRENT THERAPY:  Daratumumab injection started 09/27/21, now on monthly maintenance with dexa   ASSESSMENT & PLAN:  Caleb Simmons is a 71 y.o. male with   1. 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 Right renal biopsy on 08/28/21 revealing AL amyloidosis, lambda light chain composition. 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 Both serum and urine lamda Free light chains significantly elevated. -Due to the multiorgan involvement, with significant CHF (EF 45-50%) and renal failure, his prognosis is very poor. -He began weekly oral Cytoxan, dexa and Velcade injection while inpatient on 09/10/21.  He tolerated well. -He began daratumumab injection 09/27/21. Chemo was held briefly following stroke 01/14/22. -not a candidate for biphosphonate due to renal failure -he completed 6 cycle induction chemo with CyBorD on 03/21/22 and moved to monthly Dara maintenance on 03/28/22. Plan to continue for a total of 2 years (24 cycles).  -His most recent kappa light chain was slightly elevated, non-specific, lamda chain has been normal since 02/2022, last M protein on 06/20/22 not detected.  -he is clinically doing a bit better. Labs reviewed, slightly lower today, will monitor. Adequate to continue maintenance dara.   2. Symptom management: Low appetite, anxiety, pain -he is sleeping better with Ambien, Xanax, and Remeron -pain managed with oxycodone per NP Nikki   4. 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 Also on folic acid -hgb slightly low today at 10.1 (09/12/22), will monitor   5. Comorbidities from amyloidosis: ESRD on HD, Orthostatic Hypotension, CHF  -he is now on dialysis Mon and Fri  -he met with Dr. Aundra Dubin on 10/23/21 and was started on midodrine.     PLAN: -proceed with Cycle 13 of dara/dex today -appointment with NP Nikki to be rescheduled -lab and Dara injection every 4 weeks -f/u in 12 weeks   No problem-specific Assessment & Plan notes found for this encounter.   INTERVAL HISTORY:  Caleb Simmons is here for a follow up of amyloidosis. He was last seen by me on 06/20/22. He presents to the clinic accompanied by his wife. They report things are going well overall. She notes he is eating better.   All other systems were reviewed with the patient and are negative.  MEDICAL HISTORY:  History reviewed. No pertinent past medical history.  SURGICAL HISTORY: Past Surgical History:  Procedure Laterality Date   APPENDECTOMY     IR ANGIO INTRA EXTRACRAN SEL COM CAROTID INNOMINATE BILAT MOD SED  01/16/2022   IR ANGIO INTRA EXTRACRAN SEL COM CAROTID INNOMINATE BILAT MOD SED  07/16/2022   IR ANGIO VERTEBRAL SEL SUBCLAVIAN INNOMINATE BILAT MOD SED  01/16/2022   IR ANGIO VERTEBRAL SEL SUBCLAVIAN INNOMINATE UNI R MOD SED  07/16/2022   IR ANGIO VERTEBRAL SEL VERTEBRAL UNI L MOD SED  07/16/2022   IR CT HEAD LTD  01/18/2022   IR EMBO ART  VEN HEMORR LYMPH EXTRAV  INC GUIDE ROADMAPPING  08/30/2021   IR FLUORO GUIDE CV LINE RIGHT  08/30/2021   IR FLUORO GUIDE CV LINE RIGHT  09/18/2021   IR FLUORO GUIDE CV LINE RIGHT  05/02/2022   IR PERCUTANEOUS ART THROMBECTOMY/INFUSION INTRACRANIAL INC DIAG ANGIO  01/18/2022   IR RADIOLOGIST EVAL & MGMT  08/02/2022   IR RENAL SELECTIVE  UNI INC S&I MOD SED  08/31/2021   IR US GUIDE VASC ACCESS RIGHT  08/30/2021    IR US GUIDE VASC ACCESS RIGHT  08/30/2021   IR US GUIDE VASC ACCESS RIGHT  09/18/2021   IR US GUIDE VASC ACCESS RIGHT  01/16/2022   IR US GUIDE VASC ACCESS RIGHT  07/16/2022   IR VENOCAVAGRAM SVC  05/02/2022   RADIOLOGY WITH ANESTHESIA N/A 01/18/2022   Procedure: Cerebral angioplasty with possible stenting;  Surgeon: Luanne Bras, MD;  Location: [facility];  Service: Radiology;  Laterality: N/A;    I have reviewed the social history and family history with the patient and they are unchanged from previous note.  ALLERGIES:  has No Known Allergies.  MEDICATIONS:  Current Outpatient Medications  Medication Sig Dispense Refill   acetaminophen (TYLENOL) 500 MG tablet Take 1,000 mg by mouth every 6 (six) hours as needed for moderate pain.     ALPRAZolam (XANAX) 1 MG tablet Take 1 tablet (1 mg total) by mouth every 8 (eight) hours as needed for anxiety or sleep. 90 tablet 0   aspirin 81 MG EC tablet Take 1 tablet (81 mg total) by mouth daily. Swallow whole. 30 tablet 11   cetirizine (ZYRTEC) 10 MG tablet Take 10 mg by mouth daily.     diclofenac Sodium (VOLTAREN) 1 % GEL Apply 1 Application topically 4 (four) times daily as needed (pain).     ezetimibe (ZETIA) 10 MG tablet Take 1 tablet (10 mg total) by mouth daily. 90 tablet 3   folic acid (FOLVITE) 1 MG tablet TAKE 2 TABLETS BY MOUTH EVERY DAY 60 tablet 0   Melatonin 10 MG TABS Take 10 mg by mouth at bedtime.     metoprolol succinate (TOPROL-XL) 25 MG 24 hr tablet TAKE 1/2 TABLET BY MOUTH EVERY DAY 15 tablet 4   midodrine (PROAMATINE) 5 MG tablet TAKE 1 TABLET BY MOUTH 3 TIMES DAILY WITH MEALS 270 tablet 3   mirtazapine (REMERON) 30 MG tablet TAKE 1 TABLET BY MOUTH AT BEDTIME 30 tablet 1   ondansetron (ZOFRAN) 4 MG tablet TAKE 1 TABLET BY MOUTH EVERY 8 HOURS AS NEEDED FOR NAUSEA AND VOMITING 45 tablet 2   oxyCODONE (ROXICODONE) 15 MG immediate release tablet Take 1 tablet (15 mg total) by mouth every 6 (six) hours as needed. 60 tablet 0    pantoprazole (PROTONIX) 40 MG tablet TAKE 1 TABLET BY MOUTH 2 TIMES DAILY BEFORE A meal 60 tablet 0   prochlorperazine (COMPAZINE) 5 MG tablet Take 1 tablet (5 mg total) by mouth every 6 (six) hours as needed for nausea or vomiting. (Patient taking differently: Take 5 mg by mouth every 4 (four) hours as needed for nausea or vomiting.) 30 tablet 2   senna-docusate (SENNA S) 8.6-50 MG tablet Take 1 tablet by mouth daily. 30 tablet 6   tamsulosin (FLOMAX) 0.4 MG CAPS capsule TAKE 1 CAPSULE BY MOUTH EVERY DAY 30 capsule 0   zolpidem (AMBIEN) 5 MG tablet TAKE 1 TABLET BY MOUTH AT BEDTIME AS NEEDED FOR SLEEP 30 tablet 1   No current facility-administered medications for this visit.   Facility-Administered  Medications Ordered in Other Visits  Medication Dose Route Frequency Provider Last Rate Last Admin   heparin lock flush 100 unit/mL  500 Units Intracatheter Once Truitt Merle, MD       sodium chloride flush (NS) 0.9 % injection 10 mL  10 mL Intracatheter Once Truitt Merle, MD        PHYSICAL EXAMINATION: ECOG PERFORMANCE STATUS: 2 - Symptomatic, <50% confined to bed  Vitals:   09/12/22 0837  BP: 136/68  Pulse: 77  Resp: 18  Temp: 98.5 F (36.9 C)  SpO2: 99%   Wt Readings from Last 3 Encounters:  09/12/22 152 lb 4.8 oz (69.1 kg)  08/15/22 149 lb 12.8 oz (67.9 kg)  07/18/22 151 lb 8 oz (68.7 kg)     GENERAL:alert, no distress and comfortable SKIN: skin color, texture, turgor are normal, no rashes or significant lesions EYES: normal, Conjunctiva are pink and non-injected, sclera clear  LUNGS: clear to auscultation and percussion with normal breathing effort HEART: regular rate & rhythm and no murmurs and no lower extremity edema Musculoskeletal:no cyanosis of digits and no clubbing  NEURO: alert & oriented x 3 with fluent speech, no focal motor/sensory deficits  LABORATORY DATA:  I have reviewed the data as listed    Latest Ref Rng & Units 09/12/2022    8:25 AM 08/15/2022    8:45 AM  07/18/2022    8:06 AM  CBC  WBC 4.0 - 10.5 K/uL 8.8  8.2  8.8   Hemoglobin 13.0 - 17.0 g/dL 10.1  12.1  11.9   Hematocrit 39.0 - 52.0 % 31.0  36.3  36.2   Platelets 150 - 400 K/uL 210  190  184         Latest Ref Rng & Units 09/12/2022    8:25 AM 08/15/2022    8:45 AM 07/18/2022    8:06 AM  CMP  Glucose 70 - 99 mg/dL 124  119  142   BUN 8 - 23 mg/dL 31  32  27   Creatinine 0.61 - 1.24 mg/dL 4.41  4.41  3.82   Sodium 135 - 145 mmol/L 137  139  138   Potassium 3.5 - 5.1 mmol/L 4.7  4.3  4.3   Chloride 98 - 111 mmol/L 106  107  107   CO2 22 - 32 mmol/L 25  25  24    Calcium 8.9 - 10.3 mg/dL 8.5  9.0  8.7   Total Protein 6.5 - 8.1 g/dL 5.6  6.0  5.8   Total Bilirubin 0.3 - 1.2 mg/dL 0.2  0.2  0.3   Alkaline Phos 38 - 126 U/L 80  84  83   AST 15 - 41 U/L 8  8  9    ALT 0 - 44 U/L 5  5  <5       RADIOGRAPHIC STUDIES: I have personally reviewed the radiological images as listed and agreed with the findings in the report. No results found.    Orders Placed This Encounter  Procedures   CBC with Differential ([facility] Only)    Standing Status:   Future    Standing Expiration Date:   10/11/2023   CBC with Differential ([facility] Only)    Standing Status:   Future    Standing Expiration Date:   11/08/2023   CBC with Differential ([facility] Only)    Standing Status:   Future    Standing Expiration Date:   12/06/2023   All questions were answered. The  patient knows to call the clinic with any problems, questions or concerns. No barriers to learning was detected. The total time spent in the appointment was 30 minutes.     Truitt Merle, MD 09/12/2022   I, Wilburn Mylar, am acting as scribe for Truitt Merle, MD.   I have reviewed the above documentation for accuracy and completeness, and I agree with the above.

## 2022-09-12 NOTE — Patient Instructions (Signed)
[facility]   Discharge Instructions: Thank you for choosing [facility] to provide your oncology and hematology care.   If you have a lab appointment with the [facility], please go directly to the [facility] and check in at the registration area.   Wear comfortable clothing and clothing appropriate for easy access to any Portacath or PICC line.   We strive to give you quality time with your provider. You may need to reschedule your appointment if you arrive late (15 or more minutes).  Arriving late affects you and other patients whose appointments are after yours.  Also, if you miss three or more appointments without notifying the office, you may be dismissed from the clinic at the provider's discretion.      For prescription refill requests, have your pharmacy contact our office and allow 72 hours for refills to be completed.    Today you received the following chemotherapy and/or immunotherapy agents: Daratumumab hyaluronidase-fihj (Darzalex faspro)   To help prevent nausea and vomiting after your treatment, we encourage you to take your nausea medication as directed.  BELOW ARE SYMPTOMS THAT SHOULD BE REPORTED IMMEDIATELY: *FEVER GREATER THAN 100.4 F (38 C) OR HIGHER *CHILLS OR SWEATING *NAUSEA AND VOMITING THAT IS NOT CONTROLLED WITH YOUR NAUSEA MEDICATION *UNUSUAL SHORTNESS OF BREATH *UNUSUAL BRUISING OR BLEEDING *URINARY PROBLEMS (pain or burning when urinating, or frequent urination) *BOWEL PROBLEMS (unusual diarrhea, constipation, pain near the anus) TENDERNESS IN MOUTH AND THROAT WITH OR WITHOUT PRESENCE OF ULCERS (sore throat, sores in mouth, or a toothache) UNUSUAL RASH, SWELLING OR PAIN  UNUSUAL VAGINAL DISCHARGE OR ITCHING   Items with * indicate a potential emergency and should be followed up as soon as possible or go to the Emergency Department if any problems should occur.  Please show the CHEMOTHERAPY ALERT CARD or  IMMUNOTHERAPY ALERT CARD at check-in to the Emergency Department and triage nurse.  Should you have questions after your visit or need to cancel or reschedule your appointment, please contact [facility]  Dept: [phone]  and follow the prompts.  Office hours are 8:00 a.m. to 4:30 p.m. Monday - Friday. Please note that voicemails left after 4:00 p.m. may not be returned until the following business day.  We are closed weekends and major holidays. You have access to a nurse at all times for urgent questions. Please call the main number to the clinic Dept: [phone] and follow the prompts.   For any non-urgent questions, you may also contact your provider using MyChart. We now offer e-Visits for anyone 27 and older to request care online for non-urgent symptoms. For details visit [email or web address].   Also download the MyChart app! Go to the app store, search "MyChart", open the app, select [facility], and log in with your MyChart username and password.  Masks are optional in the cancer centers. If you would like for your care team to wear a mask while they are taking care of you, please let them know. For doctor visits, patients may have with them one support Andrae Claunch who is at least 72 years old. At this time, visitors are not allowed in the infusion area.

## 2022-09-12 NOTE — Telephone Encounter (Signed)
Spoke with pt SO, Nancy, notified of appt cancellation, no further needs at this time.

## 2022-09-20 ENCOUNTER — Other Ambulatory Visit: Payer: Self-pay

## 2022-09-20 DIAGNOSIS — G893 Neoplasm related pain (acute) (chronic): Secondary | ICD-10-CM

## 2022-09-20 DIAGNOSIS — E854 Organ-limited amyloidosis: Secondary | ICD-10-CM

## 2022-09-20 DIAGNOSIS — Z515 Encounter for palliative care: Secondary | ICD-10-CM

## 2022-09-20 MED ORDER — OXYCODONE HCL 15 MG PO TABS: 15.0000 mg | ORAL_TABLET | Freq: Four times a day (QID) | ORAL | 0 refills | Status: DC | PRN

## 2022-09-20 NOTE — Telephone Encounter (Signed)
Pt SO called for a refill of oxycodone, see new orders.

## 2022-09-26 ENCOUNTER — Other Ambulatory Visit: Payer: Self-pay | Admitting: Hematology

## 2022-09-26 ENCOUNTER — Other Ambulatory Visit: Payer: Self-pay | Admitting: Nurse Practitioner

## 2022-10-01 ENCOUNTER — Encounter: Payer: Self-pay | Admitting: Nurse Practitioner

## 2022-10-01 ENCOUNTER — Inpatient Hospital Stay: Payer: Commercial Managed Care - PPO | Attending: Hematology | Admitting: Nurse Practitioner

## 2022-10-01 DIAGNOSIS — Z515 Encounter for palliative care: Secondary | ICD-10-CM | POA: Diagnosis not present

## 2022-10-01 DIAGNOSIS — G893 Neoplasm related pain (acute) (chronic): Secondary | ICD-10-CM | POA: Diagnosis not present

## 2022-10-01 DIAGNOSIS — F419 Anxiety disorder, unspecified: Secondary | ICD-10-CM | POA: Diagnosis not present

## 2022-10-01 DIAGNOSIS — Z5112 Encounter for antineoplastic immunotherapy: Secondary | ICD-10-CM | POA: Insufficient documentation

## 2022-10-01 DIAGNOSIS — I43 Cardiomyopathy in diseases classified elsewhere: Secondary | ICD-10-CM

## 2022-10-01 DIAGNOSIS — E8581 Light chain (AL) amyloidosis: Secondary | ICD-10-CM | POA: Insufficient documentation

## 2022-10-01 DIAGNOSIS — E854 Organ-limited amyloidosis: Secondary | ICD-10-CM | POA: Diagnosis not present

## 2022-10-01 DIAGNOSIS — C9000 Multiple myeloma not having achieved remission: Secondary | ICD-10-CM | POA: Insufficient documentation

## 2022-10-01 DIAGNOSIS — G4700 Insomnia, unspecified: Secondary | ICD-10-CM

## 2022-10-01 MED ORDER — ZOLPIDEM TARTRATE 5 MG PO TABS: 5.0000 mg | ORAL_TABLET | Freq: Every evening | ORAL | 1 refills | Status: DC | PRN

## 2022-10-01 MED ORDER — ALPRAZOLAM 1 MG PO TABS: 1.0000 mg | ORAL_TABLET | Freq: Three times a day (TID) | ORAL | 0 refills | Status: DC | PRN

## 2022-10-01 NOTE — Progress Notes (Signed)
[facility]  Telephone:(336) [phone] Fax:(336) [phone]   Name: Caleb Simmons Date: 10/01/2022 MRN: [id number]  DOB: 1951-05-04  Patient Care Team: Pcp, No as PCP - General Buntin, Lavonda Jumbo, RN (Inactive) as Registered Nurse [place], Carlena Sax, NP as Nurse Practitioner (Nurse Practitioner)   I connected with Caleb Simmons on 10/01/22 at 10:30 AM EDT by phone and verified that I am speaking with the correct person using two identifiers.   I discussed the limitations, risks, security and privacy concerns of performing an evaluation and management service by telemedicine and the availability of in-person appointments. I also discussed with the patient that there may be a patient responsible charge related to this service. The patient expressed understanding and agreed to proceed.   Other persons participating in the visit and their role in the encounter: Caleb Simmons   Patient's location: Home   Provider's location: [place]    INTERVAL HISTORY: Caleb Simmons is a 71 y.o. 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  Recently admitted and discharged on 01/20/22 after receiving treatment for left MCA CVA. Palliative following for ongoing goals of care discussions and symptom management.   SOCIAL HISTORY:     reports that he has been smoking cigarettes. He has a 6.25 pack-year smoking history. He has never used smokeless tobacco.  ADVANCE DIRECTIVES:  Patient reports completed document.  [name], Caleb Simmons. is his healthcare power of attorney.  CODE STATUS: Full code  PAST MEDICAL HISTORY:History reviewed. No pertinent past medical history.  ALLERGIES:  has No Known Allergies.  MEDICATIONS:  Current Outpatient Medications  Medication Sig Dispense Refill   acetaminophen (TYLENOL) 500 MG tablet Take 1,000 mg by  mouth every 6 (six) hours as needed for moderate pain.     [START ON 10/02/2022] ALPRAZolam (XANAX) 1 MG tablet Take 1 tablet (1 mg total) by mouth every 8 (eight) hours as needed for anxiety or sleep. 90 tablet 0   aspirin 81 MG EC tablet Take 1 tablet (81 mg total) by mouth daily. Swallow whole. 30 tablet 11   cetirizine (ZYRTEC) 10 MG tablet Take 10 mg by mouth daily.     diclofenac Sodium (VOLTAREN) 1 % GEL Apply 1 Application topically 4 (four) times daily as needed (pain).     ezetimibe (ZETIA) 10 MG tablet Take 1 tablet (10 mg total) by mouth daily. 90 tablet 3   folic acid (FOLVITE) 1 MG tablet TAKE 2 TABLETS BY MOUTH EVERY DAY 60 tablet 0   Melatonin 10 MG TABS Take 10 mg by mouth at bedtime.     metoprolol succinate (TOPROL-XL) 25 MG 24 hr tablet TAKE 1/2 TABLET BY MOUTH EVERY DAY 15 tablet 4   midodrine (PROAMATINE) 5 MG tablet TAKE 1 TABLET BY MOUTH 3 TIMES DAILY WITH MEALS 270 tablet 3   mirtazapine (REMERON) 30 MG tablet TAKE 1 TABLET BY MOUTH AT BEDTIME 30 tablet 1   ondansetron (ZOFRAN) 4 MG tablet TAKE 1 TABLET BY MOUTH EVERY 8 HOURS AS NEEDED FOR NAUSEA AND VOMITING 45 tablet 2   oxyCODONE (ROXICODONE) 15 MG immediate release tablet Take 1 tablet (15 mg total) by mouth every 6 (six) hours as needed. 60 tablet 0   pantoprazole (PROTONIX) 40 MG tablet TAKE 1 TABLET BY MOUTH 2 TIMES DAILY BEFORE A MEAL 60 tablet 0   prochlorperazine (COMPAZINE) 5 MG  tablet Take 1 tablet (5 mg total) by mouth every 6 (six) hours as needed for nausea or vomiting. (Patient taking differently: Take 5 mg by mouth every 4 (four) hours as needed for nausea or vomiting.) 30 tablet 2   senna-docusate (SENNA S) 8.6-50 MG tablet Take 1 tablet by mouth daily. 30 tablet 6   tamsulosin (FLOMAX) 0.4 MG CAPS capsule TAKE 1 CAPSULE BY MOUTH EVERY DAY 30 capsule 0   [START ON 10/02/2022] zolpidem (AMBIEN) 5 MG tablet Take 1 tablet (5 mg total) by mouth at bedtime as needed. for sleep 30 tablet 1   No current  facility-administered medications for this visit.   Facility-Administered Medications Ordered in Other Visits  Medication Dose Route Frequency Provider Last Rate Last Admin   heparin lock flush 100 unit/mL  500 Units Intracatheter Once Caleb Merle, MD       sodium chloride flush (NS) 0.9 % injection 10 mL  10 mL Intracatheter Once Caleb Merle, MD        VITAL SIGNS: There were no vitals taken for this visit. There were no vitals filed for this visit.    Estimated body mass index is 21.85 kg/m as calculated from the following:   Height as of 09/12/22: '5\' 10"'$  (1.778 m).   Weight as of 09/12/22: 152 lb 4.8 oz (69.1 kg).   PERFORMANCE STATUS (ECOG) : 2 - Symptomatic, <50% confined to bed   IMPRESSION:  I connected with Caleb Simmons by phone for ongoing follow-up and symptom management.  No acute distress identified.  Patient and his significant other are preparing to have lunch together at a [employer].  He recently had a family beach trip which she enjoyed.  Overall continues to do well with no significant changes or needs.  Pain Caleb Simmons states his pain is well controlled.  He is currently taking Oxy IR as needed.  Pain fluctuates depending on levels of activity. Does not need as often. Denies increase or changes to pain.   No adjustments needed. Will continue to closely monitor. has decreased over the past week.     Decreased Appetite Tolerating Remeron 30 mg. Appetite has improved. Does decrease some around treatment days. Is appreciative of his appetite and ability to eat foods he enjoys. We will continue to closely monitor.   Constipation Is actively taking Miralax daily for bowel regimen with Senna. Improving.   4.  Anxiety Caleb Simmons complains of ongoing anxiety.  Anxiety is now much more controlled on current regimen.  He is taking Xanax as prescribed confirming he is not taking as often as he was previously.  We will continue to closely monitor.     PLAN: Oxy IR 5-10 mg every  4-6 hours. Will continue to monitor closely. If pain continues will consider adding a long-acting regimen. Some concerns with hypotension.   Will closely monitor appetite and make adjustments if no increase or further weight decline. Education provided on small frequent meals. Careful considerations given he is an active dialysis patient. Recommendations for Neproshakes for added nourishment. Currently tolerating Remeron. Weight trending down.   Xanax 1 mg every 8 hours as needed for anxiety/sleep Miralax twice daily Senna-S daily Zofran as needed for nausea.  I will plan to see him back in 3-4 weeks in collaboration with his other oncology appointments.   Patient expressed understanding and was in agreement with this plan. He also understands that He can call the clinic at any time with any questions, concerns, or complaints.  Any controlled substances utilized were prescribed in the context of palliative care. PDMP has been reviewed.    Time Total: 35 min  Visit consisted of counseling and education dealing with the complex and emotionally intense issues of symptom management and palliative care in the setting of serious and potentially life-threatening illness.Greater than 50%  of this time was spent counseling and coordinating care related to the above assessment and plan.  Alda Lea, AGPCNP-BC  Palliative Medicine Team/[facility]

## 2022-10-03 ENCOUNTER — Other Ambulatory Visit: Payer: Self-pay | Admitting: Nurse Practitioner

## 2022-10-03 DIAGNOSIS — G893 Neoplasm related pain (acute) (chronic): Secondary | ICD-10-CM

## 2022-10-03 DIAGNOSIS — E854 Organ-limited amyloidosis: Secondary | ICD-10-CM

## 2022-10-03 DIAGNOSIS — Z515 Encounter for palliative care: Secondary | ICD-10-CM

## 2022-10-03 MED ORDER — OXYCODONE HCL 15 MG PO TABS: 15.0000 mg | ORAL_TABLET | Freq: Four times a day (QID) | ORAL | 0 refills | Status: DC | PRN

## 2022-10-10 ENCOUNTER — Inpatient Hospital Stay: Payer: Commercial Managed Care - PPO

## 2022-10-10 ENCOUNTER — Other Ambulatory Visit: Payer: Self-pay

## 2022-10-10 ENCOUNTER — Telehealth: Payer: Self-pay | Admitting: Medical Oncology

## 2022-10-10 VITALS — BP 149/93 | HR 74 | Temp 98.2°F | Resp 16 | Ht 70.0 in | Wt 148.8 lb

## 2022-10-10 DIAGNOSIS — C9000 Multiple myeloma not having achieved remission: Secondary | ICD-10-CM | POA: Diagnosis present

## 2022-10-10 DIAGNOSIS — Z5112 Encounter for antineoplastic immunotherapy: Secondary | ICD-10-CM | POA: Diagnosis present

## 2022-10-10 DIAGNOSIS — E8581 Light chain (AL) amyloidosis: Secondary | ICD-10-CM | POA: Diagnosis present

## 2022-10-10 LAB — COMPREHENSIVE METABOLIC PANEL
ALT: 6 U/L (ref 0–44)
AST: 10 U/L — ABNORMAL LOW (ref 15–41)
Albumin: 3.4 g/dL — ABNORMAL LOW (ref 3.5–5.0)
Alkaline Phosphatase: 90 U/L (ref 38–126)
Anion gap: 9 (ref 5–15)
BUN: 26 mg/dL — ABNORMAL HIGH (ref 8–23)
CO2: 24 mmol/L (ref 22–32)
Calcium: 8.2 mg/dL — ABNORMAL LOW (ref 8.9–10.3)
Chloride: 105 mmol/L (ref 98–111)
Creatinine, Ser: 4.25 mg/dL (ref 0.61–1.24)
GFR, Estimated: 14 mL/min — ABNORMAL LOW (ref >60)
Glucose, Bld: 112 mg/dL — ABNORMAL HIGH (ref 70–99)
Potassium: 4 mmol/L (ref 3.5–5.1)
Sodium: 138 mmol/L (ref 135–145)
Total Bilirubin: 0.3 mg/dL (ref 0.3–1.2)
Total Protein: 5.7 g/dL — ABNORMAL LOW (ref 6.5–8.1)

## 2022-10-10 LAB — CBC WITH DIFFERENTIAL (CANCER CENTER ONLY)
Abs Immature Granulocytes: 0.03 10*3/uL (ref 0.00–0.07)
Basophils Absolute: 0.1 10*3/uL (ref 0.0–0.1)
Basophils Relative: 1 %
Eosinophils Absolute: 0.3 10*3/uL (ref 0.0–0.5)
Eosinophils Relative: 4 %
HCT: 29.7 % — ABNORMAL LOW (ref 39.0–52.0)
Hemoglobin: 9.7 g/dL — ABNORMAL LOW (ref 13.0–17.0)
Immature Granulocytes: 0 %
Lymphocytes Relative: 24 %
Lymphs Abs: 1.9 10*3/uL (ref 0.7–4.0)
MCH: 30.4 pg (ref 26.0–34.0)
MCHC: 32.7 g/dL (ref 30.0–36.0)
MCV: 93.1 fL (ref 80.0–100.0)
Monocytes Absolute: 0.5 10*3/uL (ref 0.1–1.0)
Monocytes Relative: 7 %
Neutro Abs: 5 10*3/uL (ref 1.7–7.7)
Neutrophils Relative %: 64 %
Platelet Count: 205 10*3/uL (ref 150–400)
RBC: 3.19 MIL/uL — ABNORMAL LOW (ref 4.22–5.81)
RDW: 15.5 % (ref 11.5–15.5)
WBC Count: 7.8 10*3/uL (ref 4.0–10.5)
nRBC: 0 % (ref 0.0–0.2)

## 2022-10-10 MED ORDER — DARATUMUMAB-HYALURONIDASE-FIHJ 1800-30000 MG-UT/15ML ~~LOC~~ SOLN
1800.0000 mg | Freq: Once | SUBCUTANEOUS | Status: AC
  Administered 2022-10-10: 1800 mg via SUBCUTANEOUS
  Filled 2022-10-10: qty 15

## 2022-10-10 MED ORDER — ACETAMINOPHEN 325 MG PO TABS
650.0000 mg | ORAL_TABLET | Freq: Once | ORAL | Status: AC
  Administered 2022-10-10: 650 mg via ORAL
  Filled 2022-10-10: qty 2

## 2022-10-10 MED ORDER — DIPHENHYDRAMINE HCL 25 MG PO CAPS
50.0000 mg | ORAL_CAPSULE | Freq: Once | ORAL | Status: AC
  Administered 2022-10-10: 50 mg via ORAL
  Filled 2022-10-10: qty 2

## 2022-10-10 MED ORDER — DEXAMETHASONE 4 MG PO TABS
20.0000 mg | ORAL_TABLET | Freq: Once | ORAL | Status: AC
  Administered 2022-10-10: 20 mg via ORAL
  Filled 2022-10-10: qty 5

## 2022-10-10 NOTE — Patient Instructions (Signed)
[facility]   Discharge Instructions: Thank you for choosing [facility] to provide your oncology and hematology care.   If you have a lab appointment with the [facility], please go directly to the [facility] and check in at the registration area.   Wear comfortable clothing and clothing appropriate for easy access to any Portacath or PICC line.   We strive to give you quality time with your provider. You may need to reschedule your appointment if you arrive late (15 or more minutes).  Arriving late affects you and other patients whose appointments are after yours.  Also, if you miss three or more appointments without notifying the office, you may be dismissed from the clinic at the provider's discretion.      For prescription refill requests, have your pharmacy contact our office and allow 72 hours for refills to be completed.    Today you received the following chemotherapy and/or immunotherapy agents: Daratumumab hyaluronidase-fihj (Darzalex faspro)   To help prevent nausea and vomiting after your treatment, we encourage you to take your nausea medication as directed.  BELOW ARE SYMPTOMS THAT SHOULD BE REPORTED IMMEDIATELY: *FEVER GREATER THAN 100.4 F (38 C) OR HIGHER *CHILLS OR SWEATING *NAUSEA AND VOMITING THAT IS NOT CONTROLLED WITH YOUR NAUSEA MEDICATION *UNUSUAL SHORTNESS OF BREATH *UNUSUAL BRUISING OR BLEEDING *URINARY PROBLEMS (pain or burning when urinating, or frequent urination) *BOWEL PROBLEMS (unusual diarrhea, constipation, pain near the anus) TENDERNESS IN MOUTH AND THROAT WITH OR WITHOUT PRESENCE OF ULCERS (sore throat, sores in mouth, or a toothache) UNUSUAL RASH, SWELLING OR PAIN  UNUSUAL VAGINAL DISCHARGE OR ITCHING   Items with * indicate a potential emergency and should be followed up as soon as possible or go to the Emergency Department if any problems should occur.  Please show the CHEMOTHERAPY ALERT CARD or  IMMUNOTHERAPY ALERT CARD at check-in to the Emergency Department and triage nurse.  Should you have questions after your visit or need to cancel or reschedule your appointment, please contact [facility]  Dept: [phone]  and follow the prompts.  Office hours are 8:00 a.m. to 4:30 p.m. Monday - Friday. Please note that voicemails left after 4:00 p.m. may not be returned until the following business day.  We are closed weekends and major holidays. You have access to a nurse at all times for urgent questions. Please call the main number to the clinic Dept: [phone] and follow the prompts.   For any non-urgent questions, you may also contact your provider using MyChart. We now offer e-Visits for anyone 6 and older to request care online for non-urgent symptoms. For details visit [email or web address].   Also download the MyChart app! Go to the app store, search "MyChart", open the app, select [facility], and log in with your MyChart username and password.  Masks are optional in the cancer centers. If you would like for your care team to wear a mask while they are taking care of you, please let them know. For doctor visits, patients may have with them one support person who is at least 72 years old. At this time, visitors are not allowed in the infusion area.

## 2022-10-10 NOTE — Telephone Encounter (Signed)
CRITICAL VALUE STICKER  CRITICAL VALUE: creatinine 4.25  RECEIVER (on-site recipient of call):Brigetta Beckstrom  DATE & TIME NOTIFIED: 10/1901925  MESSENGER (representative from lab): lab  MD NOTIFIED: Tammi Sou, RN for Dr Burr Medico  TIME OF NOTIFICATION:0930   RESPONSE:  Pt normally has creatinine above 4 because he is on Dialysis. No further action.

## 2022-10-11 LAB — KAPPA/LAMBDA LIGHT CHAINS
Kappa free light chain: 40 mg/L — ABNORMAL HIGH (ref 3.3–19.4)
Kappa, lambda light chain ratio: 2 — ABNORMAL HIGH (ref 0.26–1.65)
Lambda free light chains: 20 mg/L (ref 5.7–26.3)

## 2022-10-14 ENCOUNTER — Other Ambulatory Visit: Payer: Self-pay | Admitting: Hematology

## 2022-10-14 LAB — MULTIPLE MYELOMA PANEL, SERUM
Albumin SerPl Elph-Mcnc: 2.9 g/dL (ref 2.9–4.4)
Albumin/Glob SerPl: 1.4 (ref 0.7–1.7)
Alpha 1: 0.3 g/dL (ref 0.0–0.4)
Alpha2 Glob SerPl Elph-Mcnc: 0.8 g/dL (ref 0.4–1.0)
B-Globulin SerPl Elph-Mcnc: 0.7 g/dL (ref 0.7–1.3)
Gamma Glob SerPl Elph-Mcnc: 0.4 g/dL (ref 0.4–1.8)
Globulin, Total: 2.2 g/dL (ref 2.2–3.9)
IgA: 39 mg/dL — ABNORMAL LOW (ref 61–437)
IgG (Immunoglobin G), Serum: 314 mg/dL — ABNORMAL LOW (ref 603–1613)
IgM (Immunoglobulin M), Srm: 70 mg/dL (ref 15–143)
Total Protein ELP: 5.1 g/dL — ABNORMAL LOW (ref 6.0–8.5)

## 2022-10-16 ENCOUNTER — Other Ambulatory Visit: Payer: Self-pay

## 2022-10-16 DIAGNOSIS — E854 Organ-limited amyloidosis: Secondary | ICD-10-CM

## 2022-10-16 DIAGNOSIS — G893 Neoplasm related pain (acute) (chronic): Secondary | ICD-10-CM

## 2022-10-16 DIAGNOSIS — Z515 Encounter for palliative care: Secondary | ICD-10-CM

## 2022-10-16 MED ORDER — OXYCODONE HCL 15 MG PO TABS: 15.0000 mg | ORAL_TABLET | Freq: Four times a day (QID) | ORAL | 0 refills | Status: DC | PRN

## 2022-10-16 NOTE — Telephone Encounter (Signed)
Pt SO called for refill of pain meds, see new orders.

## 2022-10-22 ENCOUNTER — Telehealth: Payer: Self-pay

## 2022-10-22 ENCOUNTER — Telehealth: Payer: Self-pay | Admitting: *Deleted

## 2022-10-22 ENCOUNTER — Other Ambulatory Visit: Payer: Self-pay | Admitting: Hematology

## 2022-10-22 ENCOUNTER — Other Ambulatory Visit: Payer: Self-pay | Admitting: Nurse Practitioner

## 2022-10-22 NOTE — Telephone Encounter (Signed)
Met with [name] and Loni Beckwith.  Patient signed a [place].  Reports "calling record request office, no live answer, no message left not knowing what to say.  [employer] has not yet received any records from [facility], so continuance of disability not authorized to receive."  This nurse received copy of Guardian instructions "How to send information to Guardian.  No new letter.  Faxed copy with signed ROI and previous record request to (SW) H.I.M.  Encouraged to call (SW) H.I.M. if needed leaving name, date of birth, return number and reason for call.   No further activity by this nurse.

## 2022-10-22 NOTE — Telephone Encounter (Signed)
Pt SO, Caleb Simmons called regarding pt disability information. Information passed to [facility], who will get in contact with pt and SO today.

## 2022-10-22 NOTE — Telephone Encounter (Signed)
Connected with [name] significant other regarding process for medical records release to assist collaborative.  [place] to entry registration for pickup for patient to sign and return to [facility] to fax to (SW) H.I.M.  "[employer] Disability has not paid in six months.  The need records from June 22, 2020, to present.  He is in bed unable to come in so I'm trying to get records from several doctors faxed to [phone] by October 30, 2022, or claim is closed."

## 2022-10-31 ENCOUNTER — Other Ambulatory Visit: Payer: Self-pay

## 2022-10-31 DIAGNOSIS — G893 Neoplasm related pain (acute) (chronic): Secondary | ICD-10-CM

## 2022-10-31 DIAGNOSIS — E854 Organ-limited amyloidosis: Secondary | ICD-10-CM

## 2022-10-31 DIAGNOSIS — Z515 Encounter for palliative care: Secondary | ICD-10-CM

## 2022-10-31 MED ORDER — OXYCODONE HCL 15 MG PO TABS: 15.0000 mg | ORAL_TABLET | Freq: Four times a day (QID) | ORAL | 0 refills | Status: DC | PRN

## 2022-10-31 NOTE — Telephone Encounter (Signed)
Pt SO called for oxy refill, see new orders.

## 2022-11-07 ENCOUNTER — Inpatient Hospital Stay: Payer: Commercial Managed Care - PPO

## 2022-11-07 ENCOUNTER — Other Ambulatory Visit: Payer: Self-pay

## 2022-11-07 ENCOUNTER — Inpatient Hospital Stay: Payer: Commercial Managed Care - PPO | Attending: Hematology

## 2022-11-07 ENCOUNTER — Inpatient Hospital Stay (HOSPITAL_BASED_OUTPATIENT_CLINIC_OR_DEPARTMENT_OTHER): Payer: Commercial Managed Care - PPO | Admitting: Nurse Practitioner

## 2022-11-07 ENCOUNTER — Encounter: Payer: Commercial Managed Care - PPO | Admitting: Nutrition

## 2022-11-07 VITALS — BP 156/90 | HR 70 | Temp 98.5°F | Resp 17 | Wt 142.8 lb

## 2022-11-07 DIAGNOSIS — F419 Anxiety disorder, unspecified: Secondary | ICD-10-CM | POA: Diagnosis not present

## 2022-11-07 DIAGNOSIS — E8581 Light chain (AL) amyloidosis: Secondary | ICD-10-CM | POA: Insufficient documentation

## 2022-11-07 DIAGNOSIS — C9000 Multiple myeloma not having achieved remission: Secondary | ICD-10-CM | POA: Insufficient documentation

## 2022-11-07 DIAGNOSIS — Z515 Encounter for palliative care: Secondary | ICD-10-CM | POA: Diagnosis not present

## 2022-11-07 DIAGNOSIS — R530 Neoplastic (malignant) related fatigue: Secondary | ICD-10-CM

## 2022-11-07 DIAGNOSIS — R634 Abnormal weight loss: Secondary | ICD-10-CM

## 2022-11-07 DIAGNOSIS — G893 Neoplasm related pain (acute) (chronic): Secondary | ICD-10-CM | POA: Diagnosis not present

## 2022-11-07 DIAGNOSIS — R630 Anorexia: Secondary | ICD-10-CM

## 2022-11-07 DIAGNOSIS — Z5112 Encounter for antineoplastic immunotherapy: Secondary | ICD-10-CM | POA: Insufficient documentation

## 2022-11-07 LAB — CBC WITH DIFFERENTIAL (CANCER CENTER ONLY)
Abs Immature Granulocytes: 0.03 10*3/uL (ref 0.00–0.07)
Basophils Absolute: 0.1 10*3/uL (ref 0.0–0.1)
Basophils Relative: 1 %
Eosinophils Absolute: 0.2 10*3/uL (ref 0.0–0.5)
Eosinophils Relative: 3 %
HCT: 31.8 % — ABNORMAL LOW (ref 39.0–52.0)
Hemoglobin: 10.3 g/dL — ABNORMAL LOW (ref 13.0–17.0)
Immature Granulocytes: 0 %
Lymphocytes Relative: 21 %
Lymphs Abs: 1.8 10*3/uL (ref 0.7–4.0)
MCH: 29.9 pg (ref 26.0–34.0)
MCHC: 32.4 g/dL (ref 30.0–36.0)
MCV: 92.2 fL (ref 80.0–100.0)
Monocytes Absolute: 0.5 10*3/uL (ref 0.1–1.0)
Monocytes Relative: 6 %
Neutro Abs: 5.7 10*3/uL (ref 1.7–7.7)
Neutrophils Relative %: 69 %
Platelet Count: 179 10*3/uL (ref 150–400)
RBC: 3.45 MIL/uL — ABNORMAL LOW (ref 4.22–5.81)
RDW: 15.4 % (ref 11.5–15.5)
WBC Count: 8.2 10*3/uL (ref 4.0–10.5)
nRBC: 0 % (ref 0.0–0.2)

## 2022-11-07 MED ORDER — DIPHENHYDRAMINE HCL 25 MG PO CAPS
50.0000 mg | ORAL_CAPSULE | Freq: Once | ORAL | Status: AC
  Administered 2022-11-07: 50 mg via ORAL
  Filled 2022-11-07: qty 2

## 2022-11-07 MED ORDER — ACETAMINOPHEN 325 MG PO TABS
650.0000 mg | ORAL_TABLET | Freq: Once | ORAL | Status: AC
  Administered 2022-11-07: 650 mg via ORAL
  Filled 2022-11-07: qty 2

## 2022-11-07 MED ORDER — DARATUMUMAB-HYALURONIDASE-FIHJ 1800-30000 MG-UT/15ML ~~LOC~~ SOLN
1800.0000 mg | Freq: Once | SUBCUTANEOUS | Status: AC
  Administered 2022-11-07: 1800 mg via SUBCUTANEOUS
  Filled 2022-11-07: qty 15

## 2022-11-07 MED ORDER — DEXAMETHASONE 4 MG PO TABS
20.0000 mg | ORAL_TABLET | Freq: Once | ORAL | Status: AC
  Administered 2022-11-07: 20 mg via ORAL
  Filled 2022-11-07: qty 5

## 2022-11-07 NOTE — Patient Instructions (Signed)
[facility] ONCOLOGY  Discharge Instructions: Thank you for choosing [facility] to provide your oncology and hematology care.   If you have a lab appointment with the [facility], please go directly to the [facility] and check in at the registration area.   Wear comfortable clothing and clothing appropriate for easy access to any Portacath or PICC line.   We strive to give you quality time with your provider. You may need to reschedule your appointment if you arrive late (15 or more minutes).  Arriving late affects you and other patients whose appointments are after yours.  Also, if you miss three or more appointments without notifying the office, you may be dismissed from the clinic at the provider's discretion.      For prescription refill requests, have your pharmacy contact our office and allow 72 hours for refills to be completed.    Today you received the following chemotherapy and/or immunotherapy agents Darzalex Faspro      To help prevent nausea and vomiting after your treatment, we encourage you to take your nausea medication as directed.  BELOW ARE SYMPTOMS THAT SHOULD BE REPORTED IMMEDIATELY: *FEVER GREATER THAN 100.4 F (38 C) OR HIGHER *CHILLS OR SWEATING *NAUSEA AND VOMITING THAT IS NOT CONTROLLED WITH YOUR NAUSEA MEDICATION *UNUSUAL SHORTNESS OF BREATH *UNUSUAL BRUISING OR BLEEDING *URINARY PROBLEMS (pain or burning when urinating, or frequent urination) *BOWEL PROBLEMS (unusual diarrhea, constipation, pain near the anus) TENDERNESS IN MOUTH AND THROAT WITH OR WITHOUT PRESENCE OF ULCERS (sore throat, sores in mouth, or a toothache) UNUSUAL RASH, SWELLING OR PAIN  UNUSUAL VAGINAL DISCHARGE OR ITCHING   Items with * indicate a potential emergency and should be followed up as soon as possible or go to the Emergency Department if any problems should occur.  Please show the CHEMOTHERAPY ALERT CARD or IMMUNOTHERAPY ALERT CARD at  check-in to the Emergency Department and triage nurse.  Should you have questions after your visit or need to cancel or reschedule your appointment, please contact Portal  Dept: [phone]  and follow the prompts.  Office hours are 8:00 a.m. to 4:30 p.m. Monday - Friday. Please note that voicemails left after 4:00 p.m. may not be returned until the following business day.  We are closed weekends and major holidays. You have access to a nurse at all times for urgent questions. Please call the main number to the clinic Dept: [phone] and follow the prompts.   For any non-urgent questions, you may also contact your provider using MyChart. We now offer e-Visits for anyone 94 and older to request care online for non-urgent symptoms. For details visit [email or web address].   Also download the MyChart app! Go to the app store, search "MyChart", open the app, select [facility], and log in with your MyChart username and password.  Masks are optional in the cancer centers. If you would like for your care team to wear a mask while they are taking care of you, please let them know. You may have one support person who is at least 71 years old accompany you for your appointments.

## 2022-11-08 ENCOUNTER — Encounter: Payer: Self-pay | Admitting: Hematology

## 2022-11-08 ENCOUNTER — Telehealth: Payer: Self-pay

## 2022-11-08 MED ORDER — XTAMPZA ER 9 MG PO C12A: 9.0000 mg | EXTENDED_RELEASE_CAPSULE | Freq: Two times a day (BID) | ORAL | 0 refills | Status: DC

## 2022-11-08 NOTE — Progress Notes (Signed)
[facility]  Telephone:(336) [phone] Fax:(336) [phone]   Name: Caleb Simmons Date: 11/08/2022 MRN: [id number]  DOB: 03/08/1951  Patient Care Team: Pcp, No as PCP - General Buntin, Lavonda Jumbo, RN (Inactive) as Registered Nurse Avilla, Carlena Sax, NP as Nurse Practitioner (Nurse Practitioner)   I connected with Caleb Simmons on 11/07/22 at  1:30 PM EST by phone and verified that I am speaking with the correct person using two identifiers.   I discussed the limitations, risks, security and privacy concerns of performing an evaluation and management service by telemedicine and the availability of in-person appointments. I also discussed with the patient that there may be a patient responsible charge related to this service. The patient expressed understanding and agreed to proceed.   Other persons participating in the visit and their role in the encounter: Caleb Simmons   Patient's location: Home   Provider's location: [place]    INTERVAL HISTORY: Caleb Simmons is a 71 y.o. 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  Recently admitted and discharged on 01/20/22 after receiving treatment for left MCA CVA. Palliative following for ongoing goals of care discussions and symptom management.   SOCIAL HISTORY:     reports that he has been smoking cigarettes. He has a 6.25 pack-year smoking history. He has never used smokeless tobacco.  ADVANCE DIRECTIVES:  Patient reports completed document.  Son Caleb, Simmons. is his healthcare power of attorney.  CODE STATUS: Full code  PAST MEDICAL HISTORY:History reviewed. No pertinent past medical history.  ALLERGIES:  has No Known Allergies.  MEDICATIONS:  Current Outpatient Medications  Medication Sig Dispense Refill   oxyCODONE ER (XTAMPZA ER) 9 MG C12A Take 9 mg by mouth  every 12 (twelve) hours. 30 capsule 0   acetaminophen (TYLENOL) 500 MG tablet Take 1,000 mg by mouth every 6 (six) hours as needed for moderate pain.     ALPRAZolam (XANAX) 1 MG tablet Take 1 tablet (1 mg total) by mouth every 8 (eight) hours as needed for anxiety or sleep. 90 tablet 0   aspirin 81 MG EC tablet Take 1 tablet (81 mg total) by mouth daily. Swallow whole. 30 tablet 11   cetirizine (ZYRTEC) 10 MG tablet Take 10 mg by mouth daily.     diclofenac Sodium (VOLTAREN) 1 % GEL Apply 1 Application topically 4 (four) times daily as needed (pain).     ezetimibe (ZETIA) 10 MG tablet Take 1 tablet (10 mg total) by mouth daily. 90 tablet 3   folic acid (FOLVITE) 1 MG tablet TAKE 2 TABLETS BY MOUTH EVERY DAY 60 tablet 0   Melatonin 10 MG TABS Take 10 mg by mouth at bedtime.     metoprolol succinate (TOPROL-XL) 25 MG 24 hr tablet TAKE 1/2 TABLET BY MOUTH EVERY DAY 15 tablet 4   midodrine (PROAMATINE) 5 MG tablet TAKE 1 TABLET BY MOUTH 3 TIMES DAILY WITH MEALS 270 tablet 3   mirtazapine (REMERON) 30 MG tablet TAKE 1 TABLET BY MOUTH AT BEDTIME 30 tablet 1   ondansetron (ZOFRAN) 4 MG tablet TAKE 1 TABLET BY MOUTH EVERY 8 HOURS AS NEEDED FOR NAUSEA AND VOMITING 45 tablet 2   oxyCODONE (ROXICODONE) 15 MG immediate release tablet Take 1 tablet (15 mg total) by mouth every 6 (six) hours as needed. 60 tablet 0   pantoprazole (PROTONIX) 40 MG tablet TAKE  1 TABLET BY MOUTH 2 TIMES DAILY BEFORE A meal 60 tablet 0   prochlorperazine (COMPAZINE) 5 MG tablet Take 1 tablet (5 mg total) by mouth every 6 (six) hours as needed for nausea or vomiting. (Patient taking differently: Take 5 mg by mouth every 4 (four) hours as needed for nausea or vomiting.) 30 tablet 2   SENEXON-S 8.6-50 MG tablet TAKE 1 TABLET BY MOUTH EVERY DAY 30 tablet 6   tamsulosin (FLOMAX) 0.4 MG CAPS capsule TAKE 1 CAPSULE BY MOUTH EVERY DAY 30 capsule 0   zolpidem (AMBIEN) 5 MG tablet Take 1 tablet (5 mg total) by mouth at bedtime as needed. for  sleep 30 tablet 1   No current facility-administered medications for this visit.   Facility-Administered Medications Ordered in Other Visits  Medication Dose Route Frequency Provider Last Rate Last Admin   heparin lock flush 100 unit/mL  500 Units Intracatheter Once Truitt Merle, MD       sodium chloride flush (NS) 0.9 % injection 10 mL  10 mL Intracatheter Once Truitt Merle, MD        VITAL SIGNS: There were no vitals taken for this visit. There were no vitals filed for this visit.    Estimated body mass index is 20.48 kg/m as calculated from the following:   Height as of 10/10/22: '5\' 10"'$  (1.778 m).   Weight as of an earlier encounter on 11/07/22: 142 lb 12 oz (64.8 kg).   PERFORMANCE STATUS (ECOG) : 2 - Symptomatic, <50% confined to bed   IMPRESSION:  I connected with Mr. Baris and Caleb Simmons by phone for ongoing follow-up and symptom management. Caleb Simmons shares concerns that he is starting to lose weight again. His nephrologist is considering restarting him on dialysis 3 times weekly. He seems more fatigued over the past 1-2 weeks. Also with some loose stools causing incontinent episode due to urgency. Per Caleb Simmons only happens once. Encouraged her to continue to closely monitor for incontinence episodes.   Pain Ethin is complaining of ongoing pain despite current regimen. He is taking Oxy IR as needed but is having to take around the clock. Aches all over. Pain medication provides some relief however pain is still there, sometimes interrupting ability to do things or sleep. Feels this also has some impact on his appetite. He is emotional due to his levels of pain.   Education provided on continued use of breakthrough medication. Also discussed use of oxycodone ER. He would like to try with hopes of some improvement. Education provided on possible side effects.   We will continue to closely monitor.    Decreased Appetite Tolerating Remeron 30 mg. Appetite has improved. Does decrease some around  treatment days. Mr. Kissick understands we will continue to closely monitor his appetite and weight loss.  Her with improvement of his pain this will also cause for increase in his appetite.  Constipation Is actively taking Miralax daily for bowel regimen with Senna.  Some loose stools.  Encouraged to closely monitor.  If having diarrhea would recommend decreasing senna to 1 tablet daily.  4.  Anxiety Mr. Brandenburg complains of ongoing anxiety.  Anxiety is now much more controlled on current regimen.  He is taking Xanax as prescribed confirming he is not taking as often as he was previously.  We will continue to closely monitor.     PLAN: Oxy IR 5-10 mg every 4-6 hours as needed for breakthrough pain Xtampza 9 mg every 12 hours.  Extensive education provided on starting  medication and potential side effects. Will closely monitor appetite and make adjustments if no increase or further weight decline. Education provided on small frequent meals. Careful considerations given he is an active dialysis patient. Recommendations for Neproshakes for added nourishment. Currently tolerating Remeron. Weight trending down.   Xanax 1 mg every 8 hours as needed for anxiety/sleep Miralax twice daily Senna-S daily Zofran as needed for nausea.  I will plan to see him back in 3-4 weeks in collaboration with his other oncology appointments.   Patient expressed understanding and was in agreement with this plan. He also understands that He can call the clinic at any time with any questions, concerns, or complaints.     Any controlled substances utilized were prescribed in the context of palliative care. PDMP has been reviewed.    Time Total: 45 min   Visit consisted of counseling and education dealing with the complex and emotionally intense issues of symptom management and palliative care in the setting of serious and potentially life-threatening illness.Greater than 50%  of this time was spent counseling and  coordinating care related to the above assessment and plan.  Alda Lea, AGPCNP-BC  Palliative Medicine Team/[facility]

## 2022-11-08 NOTE — Telephone Encounter (Signed)
Pt SO called regarding pain medication, PA filed and approved, confirmed with pharmacy. This RN called pt SO back and informed her, no further needs.

## 2022-11-11 ENCOUNTER — Other Ambulatory Visit: Payer: Self-pay | Admitting: Hematology

## 2022-11-12 ENCOUNTER — Inpatient Hospital Stay: Payer: Commercial Managed Care - PPO

## 2022-11-12 ENCOUNTER — Telehealth: Payer: Self-pay

## 2022-11-12 NOTE — Telephone Encounter (Signed)
Pt SO, Caleb Simmons called yesterday regarding the pt, reporting loose stool, increased nausea, and weakness. Pt eating some foods when not nauseous and drinking gaterorade. RN encouraged pt to come in today for an appointment with Lexine Baton, NP, to be evaluated, pt SO originally agreed. This AM pt SO called reporting that pt declined to come in today and would like to stay home. Appt canceled and NP made aware. [name] agreeable to call with any questions or concerns.

## 2022-11-15 ENCOUNTER — Other Ambulatory Visit: Payer: Self-pay

## 2022-11-15 DIAGNOSIS — G893 Neoplasm related pain (acute) (chronic): Secondary | ICD-10-CM

## 2022-11-15 DIAGNOSIS — Z515 Encounter for palliative care: Secondary | ICD-10-CM

## 2022-11-15 DIAGNOSIS — F419 Anxiety disorder, unspecified: Secondary | ICD-10-CM

## 2022-11-15 DIAGNOSIS — E854 Organ-limited amyloidosis: Secondary | ICD-10-CM

## 2022-11-15 MED ORDER — ALPRAZOLAM 1 MG PO TABS: 1.0000 mg | ORAL_TABLET | Freq: Three times a day (TID) | ORAL | 0 refills | Status: DC | PRN

## 2022-11-15 MED ORDER — OXYCODONE HCL 15 MG PO TABS: 15.0000 mg | ORAL_TABLET | Freq: Four times a day (QID) | ORAL | 0 refills | Status: DC | PRN

## 2022-11-19 ENCOUNTER — Other Ambulatory Visit: Payer: Self-pay | Admitting: Nurse Practitioner

## 2022-11-19 ENCOUNTER — Other Ambulatory Visit: Payer: Self-pay | Admitting: Hematology

## 2022-11-25 ENCOUNTER — Other Ambulatory Visit: Payer: Self-pay

## 2022-11-25 ENCOUNTER — Other Ambulatory Visit (HOSPITAL_COMMUNITY): Payer: Self-pay

## 2022-11-25 MED ORDER — DEXAMETHASONE 4 MG PO TABS: 20.0000 mg | ORAL_TABLET | Freq: Once | ORAL | 3 refills | Status: AC

## 2022-11-25 MED ORDER — DEXAMETHASONE 4 MG PO TABS
20.0000 mg | ORAL_TABLET | Freq: Once | ORAL | 3 refills | Status: DC
  Filled 2022-11-25: qty 5, 1d supply, fill #0

## 2022-11-25 NOTE — Progress Notes (Signed)
Verbal order from Dr. Burr Medico for Dexamethasone '20mg'$  PO prescription to take 1hr prior to Daratumumab Injection appt.  Home prescription sent to [facility].

## 2022-11-28 ENCOUNTER — Telehealth: Payer: Self-pay

## 2022-11-28 NOTE — Progress Notes (Signed)
After discussion with the provider, the patient will begin taking their premedications at home prior to daratumumab infusions. Premedications to be taken at home include Dexamethasone, starting 12/05/2022. A calendar has been mailed to the patient's address.  Laray Anger, PharmD PGY-2 Pharmacy Resident Hematology/Oncology [phone]  11/28/2022 4:13 PM

## 2022-11-28 NOTE — Telephone Encounter (Signed)
Pt SO, [name] called asking about a "new medication" RN informed her that it is the dexamethasone called in by Dr.Feng to take prior to his chemo. Pt SO verbalized understanding, no further needs at this point.

## 2022-12-02 ENCOUNTER — Other Ambulatory Visit: Payer: Self-pay

## 2022-12-02 DIAGNOSIS — E854 Organ-limited amyloidosis: Secondary | ICD-10-CM

## 2022-12-02 DIAGNOSIS — G893 Neoplasm related pain (acute) (chronic): Secondary | ICD-10-CM

## 2022-12-02 DIAGNOSIS — Z515 Encounter for palliative care: Secondary | ICD-10-CM

## 2022-12-02 DIAGNOSIS — G4700 Insomnia, unspecified: Secondary | ICD-10-CM

## 2022-12-02 MED ORDER — ZOLPIDEM TARTRATE 5 MG PO TABS: 5.0000 mg | ORAL_TABLET | Freq: Every evening | ORAL | 1 refills | Status: DC | PRN

## 2022-12-02 MED ORDER — OXYCODONE HCL 15 MG PO TABS: 15.0000 mg | ORAL_TABLET | Freq: Four times a day (QID) | ORAL | 0 refills | Status: DC | PRN

## 2022-12-02 NOTE — Telephone Encounter (Signed)
Pt SO Caleb Simmons called for refill of oxycodone and Ambien, see new orders

## 2022-12-03 NOTE — Progress Notes (Unsigned)
[facility]   Telephone:(336) [phone] Fax:(336) [phone]   Clinic Follow up Note   Patient Care Team: Pcp, No as PCP - General Buntin, Lavonda Jumbo, RN (Inactive) as Registered Nurse Gantt, Carlena Sax, NP as Nurse Practitioner (Nurse Practitioner) Truitt Merle, MD as Attending Physician (Hematology and Oncology)  Date of Service:  12/05/2022  CHIEF COMPLAINT: f/u of multiorgan AL amyloidosis   CURRENT THERAPY:  Daratumumab injection started 09/27/21, now on monthly maintenance with dexa   ASSESSMENT:  Caleb Simmons is a 71 y.o. 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  He tolerated well. -He began daratumumab injection 09/27/21. Chemo was held briefly following stroke 01/14/22. -not a candidate for biphosphonate due to renal failure -he completed 6 cycle induction chemo with CyBorD on 03/21/22 and moved to monthly Dara maintenance on 03/28/22. Plan to continue for a total of 2 years (24 cycles). -He has had good response to treatment   ESRD (end stage renal disease) ([place]) -on HD MF   PLAN: -Lab reviewed -proceed with C16 of dara/dex today and continue every 4 weeks  -f/u in 12wks     SUMMARY OF ONCOLOGIC HISTORY: Oncology History   No history exists.     INTERVAL HISTORY:  Caleb Simmons is here for a follow up of multiorgan AL amyloidosis He was last seen by me on 09/12/2022 He presents to the clinic accompanied by wife. Pt states he is still doing Dialysis twice a week. He does his lawn care. He is very active. Wife states he still not sleeping well. His appetite is ok.   All other systems were reviewed with the patient and are negative.  MEDICAL HISTORY:  History reviewed. No pertinent past medical history.  SURGICAL HISTORY: Past Surgical  History:  Procedure Laterality Date   APPENDECTOMY     IR ANGIO INTRA EXTRACRAN SEL COM CAROTID INNOMINATE BILAT MOD SED  01/16/2022   IR ANGIO INTRA EXTRACRAN SEL COM CAROTID INNOMINATE BILAT MOD SED  07/16/2022   IR ANGIO VERTEBRAL SEL SUBCLAVIAN INNOMINATE BILAT MOD SED  01/16/2022   IR ANGIO VERTEBRAL SEL SUBCLAVIAN INNOMINATE UNI R MOD SED  07/16/2022   IR ANGIO VERTEBRAL SEL VERTEBRAL UNI L MOD SED  07/16/2022   IR CT HEAD LTD  01/18/2022   IR EMBO ART  VEN HEMORR LYMPH EXTRAV  INC GUIDE ROADMAPPING  08/30/2021   IR FLUORO GUIDE CV LINE RIGHT  08/30/2021   IR FLUORO GUIDE CV LINE RIGHT  09/18/2021   IR FLUORO GUIDE CV LINE RIGHT  05/02/2022   IR PERCUTANEOUS ART THROMBECTOMY/INFUSION INTRACRANIAL INC DIAG ANGIO  01/18/2022   IR RADIOLOGIST EVAL & MGMT  08/02/2022   IR RENAL SELECTIVE  UNI INC S&I MOD SED  08/31/2021   IR US GUIDE VASC ACCESS RIGHT  08/30/2021   IR US GUIDE VASC ACCESS RIGHT  08/30/2021   IR US GUIDE VASC ACCESS RIGHT  09/18/2021   IR US GUIDE VASC ACCESS RIGHT  01/16/2022   IR US GUIDE VASC ACCESS RIGHT  07/16/2022   IR VENOCAVAGRAM SVC  05/02/2022   RADIOLOGY WITH ANESTHESIA N/A 01/18/2022   Procedure: Cerebral angioplasty with possible stenting;  Surgeon: Luanne Bras, MD;  Location: [facility];  Service: Radiology;  Laterality: N/A;    I have reviewed the social  history and family history with the patient and they are unchanged from previous note.  ALLERGIES:  has No Known Allergies.  MEDICATIONS:  Current Outpatient Medications  Medication Sig Dispense Refill   acetaminophen (TYLENOL) 500 MG tablet Take 1,000 mg by mouth every 6 (six) hours as needed for moderate pain.     ALPRAZolam (XANAX) 1 MG tablet Take 1 tablet (1 mg total) by mouth every 8 (eight) hours as needed for anxiety or sleep. 90 tablet 0   aspirin 81 MG EC tablet Take 1 tablet (81 mg total) by mouth daily. Swallow whole. 30 tablet 11   cetirizine (ZYRTEC) 10 MG tablet Take 10 mg by mouth daily.     diclofenac  Sodium (VOLTAREN) 1 % GEL Apply 1 Application topically 4 (four) times daily as needed (pain).     ezetimibe (ZETIA) 10 MG tablet Take 1 tablet (10 mg total) by mouth daily. 90 tablet 3   folic acid (FOLVITE) 1 MG tablet TAKE 2 TABLETS BY MOUTH EVERY DAY 60 tablet 0   Melatonin 10 MG TABS Take 10 mg by mouth at bedtime.     metoprolol succinate (TOPROL-XL) 25 MG 24 hr tablet TAKE 1/2 TABLET BY MOUTH EVERY DAY 15 tablet 4   midodrine (PROAMATINE) 5 MG tablet TAKE 1 TABLET BY MOUTH 3 TIMES DAILY WITH MEALS 270 tablet 3   mirtazapine (REMERON) 30 MG tablet TAKE 1 TABLET BY MOUTH AT BEDTIME 30 tablet 1   ondansetron (ZOFRAN) 4 MG tablet TAKE 1 TABLET BY MOUTH EVERY 8 HOURS AS NEEDED FOR NAUSEA AND VOMITING 45 tablet 2   oxyCODONE (OXYCONTIN) 15 mg 12 hr tablet Take 1 tablet (15 mg total) by mouth every 12 (twelve) hours. 60 tablet 0   oxyCODONE (ROXICODONE) 15 MG immediate release tablet Take 1 tablet (15 mg total) by mouth every 6 (six) hours as needed. 60 tablet 0   pantoprazole (PROTONIX) 40 MG tablet TAKE 1 TABLET BY MOUTH 2 TIMES DAILY BEFORE A meal 60 tablet 0   prochlorperazine (COMPAZINE) 5 MG tablet Take 1 tablet (5 mg total) by mouth every 6 (six) hours as needed for nausea or vomiting. (Patient taking differently: Take 5 mg by mouth every 4 (four) hours as needed for nausea or vomiting.) 30 tablet 2   SENEXON-S 8.6-50 MG tablet TAKE 1 TABLET BY MOUTH EVERY DAY 30 tablet 6   tamsulosin (FLOMAX) 0.4 MG CAPS capsule TAKE 1 CAPSULE BY MOUTH EVERY DAY 30 capsule 0   zolpidem (AMBIEN) 5 MG tablet Take 1 tablet (5 mg total) by mouth at bedtime as needed. for sleep 30 tablet 1   No current facility-administered medications for this visit.   Facility-Administered Medications Ordered in Other Visits  Medication Dose Route Frequency Provider Last Rate Last Admin   heparin lock flush 100 unit/mL  500 Units Intracatheter Once Truitt Merle, MD       sodium chloride flush (NS) 0.9 % injection 10 mL  10 mL  Intracatheter Once Truitt Merle, MD        PHYSICAL EXAMINATION: ECOG PERFORMANCE STATUS: 2 - Symptomatic, <50% confined to bed  Vitals:   12/05/22 0906  BP: (!) 168/82  Pulse: 77  Resp: 18  Temp: 98.1 F (36.7 C)  SpO2: 100%   Wt Readings from Last 3 Encounters:  12/05/22 150 lb (68 kg)  11/07/22 142 lb 12 oz (64.8 kg)  10/10/22 148 lb 12.8 oz (67.5 kg)    GENERAL:alert, no distress and comfortable SKIN: skin color,  texture, turgor are normal, no rashes or significant lesions EYES: normal, Conjunctiva are pink and non-injected, sclera clear NECK: supple, thyroid normal size, non-tender, without nodularity LYMPH:  no palpable lymphadenopathy in the cervical, axillary LUNGS: clear to auscultation and percussion with normal breathing effort HEART: regular rate & rhythm and no murmurs and no lower extremity edema ABDOMEN:abdomen soft, non-tender and normal bowel sounds Musculoskeletal:no cyanosis of digits and no clubbing  NEURO: alert & oriented x 3 with fluent speech, no focal motor/sensory deficits  LABORATORY DATA:  I have reviewed the data as listed    Latest Ref Rng & Units 12/05/2022    8:28 AM 11/07/2022    8:09 AM 10/10/2022    8:20 AM  CBC  WBC 4.0 - 10.5 K/uL 6.8  8.2  7.8   Hemoglobin 13.0 - 17.0 g/dL 10.0  10.3  9.7   Hematocrit 39.0 - 52.0 % 31.1  31.8  29.7   Platelets 150 - 400 K/uL 201  179  205         Latest Ref Rng & Units 12/05/2022    8:28 AM 10/10/2022    8:27 AM 09/12/2022    8:25 AM  CMP  Glucose 70 - 99 mg/dL 107  112  124   BUN 8 - 23 mg/dL '26  26  31   '$ Creatinine 0.61 - 1.24 mg/dL 3.75  4.25  4.41   Sodium 135 - 145 mmol/L 142  138  137   Potassium 3.5 - 5.1 mmol/L 4.4  4.0  4.7   Chloride 98 - 111 mmol/L 107  105  106   CO2 22 - 32 mmol/L '27  24  25   '$ Calcium 8.9 - 10.3 mg/dL 8.7  8.2  8.5   Total Protein 6.5 - 8.1 g/dL 5.1  5.7  5.6   Total Bilirubin 0.3 - 1.2 mg/dL 0.3  0.3  0.2   Alkaline Phos 38 - 126 U/L 104  90  80   AST 15 -  41 U/L '8  10  8   '$ ALT 0 - 44 U/L '5  6  5       '$ RADIOGRAPHIC STUDIES: I have personally reviewed the radiological images as listed and agreed with the findings in the report. No results found.    Orders Placed This Encounter  Procedures   CBC with Differential/Platelet    Standing Status:   Standing    Number of Occurrences:   50    Standing Expiration Date:   12/06/2023   Comprehensive metabolic panel    Standing Status:   Standing    Number of Occurrences:   50    Standing Expiration Date:   12/06/2023   All questions were answered. The patient knows to call the clinic with any problems, questions or concerns. No barriers to learning was detected. The total time spent in the appointment was 30 minutes.     Truitt Merle, MD 12/05/2022   Felicity Coyer, CMA, am acting as scribe for Truitt Merle, MD.   I have reviewed the above documentation for accuracy and completeness, and I agree with the above.

## 2022-12-04 NOTE — Assessment & Plan Note (Signed)
-  lamda light chain disease with renal, cardiac and neuro involvement   -diagnosed in 08/2021 -not a candidate for transplant  --He began weekly oral Cytoxan, dexa and Velcade injection while inpatient on 09/10/21.  He tolerated well. -He began daratumumab injection 09/27/21. Chemo was held briefly following stroke 01/14/22. -not a candidate for biphosphonate due to renal failure -he completed 6 cycle induction chemo with CyBorD on 03/21/22 and moved to monthly Dara maintenance on 03/28/22. Plan to continue for a total of 2 years (24 cycles). -He has had good response to treatment  

## 2022-12-04 NOTE — Assessment & Plan Note (Signed)
-  on HD MF  

## 2022-12-05 ENCOUNTER — Encounter: Payer: Self-pay | Admitting: Hematology

## 2022-12-05 ENCOUNTER — Ambulatory Visit: Payer: Commercial Managed Care - PPO | Admitting: Hematology

## 2022-12-05 ENCOUNTER — Inpatient Hospital Stay: Payer: Commercial Managed Care - PPO

## 2022-12-05 ENCOUNTER — Other Ambulatory Visit: Payer: Commercial Managed Care - PPO

## 2022-12-05 ENCOUNTER — Ambulatory Visit: Payer: Commercial Managed Care - PPO | Admitting: Dietician

## 2022-12-05 ENCOUNTER — Ambulatory Visit: Payer: Commercial Managed Care - PPO

## 2022-12-05 ENCOUNTER — Encounter: Payer: Self-pay | Admitting: Nurse Practitioner

## 2022-12-05 ENCOUNTER — Inpatient Hospital Stay (HOSPITAL_BASED_OUTPATIENT_CLINIC_OR_DEPARTMENT_OTHER): Payer: Commercial Managed Care - PPO | Admitting: Nurse Practitioner

## 2022-12-05 ENCOUNTER — Other Ambulatory Visit: Payer: Self-pay

## 2022-12-05 ENCOUNTER — Telehealth: Payer: Self-pay

## 2022-12-05 ENCOUNTER — Inpatient Hospital Stay (HOSPITAL_BASED_OUTPATIENT_CLINIC_OR_DEPARTMENT_OTHER): Payer: Commercial Managed Care - PPO | Admitting: Hematology

## 2022-12-05 ENCOUNTER — Inpatient Hospital Stay: Payer: Commercial Managed Care - PPO | Attending: Hematology

## 2022-12-05 VITALS — BP 168/82 | HR 77 | Temp 98.1°F | Resp 18 | Ht 70.0 in | Wt 150.0 lb

## 2022-12-05 DIAGNOSIS — N186 End stage renal disease: Secondary | ICD-10-CM | POA: Insufficient documentation

## 2022-12-05 DIAGNOSIS — I43 Cardiomyopathy in diseases classified elsewhere: Secondary | ICD-10-CM | POA: Diagnosis not present

## 2022-12-05 DIAGNOSIS — R630 Anorexia: Secondary | ICD-10-CM | POA: Diagnosis not present

## 2022-12-05 DIAGNOSIS — Z5112 Encounter for antineoplastic immunotherapy: Secondary | ICD-10-CM | POA: Diagnosis present

## 2022-12-05 DIAGNOSIS — Z992 Dependence on renal dialysis: Secondary | ICD-10-CM | POA: Insufficient documentation

## 2022-12-05 DIAGNOSIS — Z515 Encounter for palliative care: Secondary | ICD-10-CM | POA: Diagnosis not present

## 2022-12-05 DIAGNOSIS — E8581 Light chain (AL) amyloidosis: Secondary | ICD-10-CM

## 2022-12-05 DIAGNOSIS — C9000 Multiple myeloma not having achieved remission: Secondary | ICD-10-CM | POA: Insufficient documentation

## 2022-12-05 DIAGNOSIS — E854 Organ-limited amyloidosis: Secondary | ICD-10-CM | POA: Diagnosis not present

## 2022-12-05 DIAGNOSIS — G893 Neoplasm related pain (acute) (chronic): Secondary | ICD-10-CM

## 2022-12-05 DIAGNOSIS — R530 Neoplastic (malignant) related fatigue: Secondary | ICD-10-CM

## 2022-12-05 DIAGNOSIS — K5900 Constipation, unspecified: Secondary | ICD-10-CM | POA: Diagnosis not present

## 2022-12-05 LAB — COMPREHENSIVE METABOLIC PANEL
ALT: <5 U/L (ref 0–44)
AST: 8 U/L — ABNORMAL LOW (ref 15–41)
Albumin: 3.4 g/dL — ABNORMAL LOW (ref 3.5–5.0)
Alkaline Phosphatase: 104 U/L (ref 38–126)
Anion gap: 8 (ref 5–15)
BUN: 26 mg/dL — ABNORMAL HIGH (ref 8–23)
CO2: 27 mmol/L (ref 22–32)
Calcium: 8.7 mg/dL — ABNORMAL LOW (ref 8.9–10.3)
Chloride: 107 mmol/L (ref 98–111)
Creatinine, Ser: 3.75 mg/dL (ref 0.61–1.24)
GFR, Estimated: 16 mL/min — ABNORMAL LOW (ref >60)
Glucose, Bld: 107 mg/dL — ABNORMAL HIGH (ref 70–99)
Potassium: 4.4 mmol/L (ref 3.5–5.1)
Sodium: 142 mmol/L (ref 135–145)
Total Bilirubin: 0.3 mg/dL (ref 0.3–1.2)
Total Protein: 5.1 g/dL — ABNORMAL LOW (ref 6.5–8.1)

## 2022-12-05 LAB — CBC WITH DIFFERENTIAL (CANCER CENTER ONLY)
Abs Immature Granulocytes: 0.03 10*3/uL (ref 0.00–0.07)
Basophils Absolute: 0.1 10*3/uL (ref 0.0–0.1)
Basophils Relative: 1 %
Eosinophils Absolute: 0.2 10*3/uL (ref 0.0–0.5)
Eosinophils Relative: 3 %
HCT: 31.1 % — ABNORMAL LOW (ref 39.0–52.0)
Hemoglobin: 10 g/dL — ABNORMAL LOW (ref 13.0–17.0)
Immature Granulocytes: 0 %
Lymphocytes Relative: 26 %
Lymphs Abs: 1.8 10*3/uL (ref 0.7–4.0)
MCH: 29.5 pg (ref 26.0–34.0)
MCHC: 32.2 g/dL (ref 30.0–36.0)
MCV: 91.7 fL (ref 80.0–100.0)
Monocytes Absolute: 0.5 10*3/uL (ref 0.1–1.0)
Monocytes Relative: 7 %
Neutro Abs: 4.3 10*3/uL (ref 1.7–7.7)
Neutrophils Relative %: 63 %
Platelet Count: 201 10*3/uL (ref 150–400)
RBC: 3.39 MIL/uL — ABNORMAL LOW (ref 4.22–5.81)
RDW: 15.9 % — ABNORMAL HIGH (ref 11.5–15.5)
WBC Count: 6.8 10*3/uL (ref 4.0–10.5)
nRBC: 0 % (ref 0.0–0.2)

## 2022-12-05 MED ORDER — OXYCODONE HCL ER 15 MG PO T12A: 15.0000 mg | EXTENDED_RELEASE_TABLET | Freq: Two times a day (BID) | ORAL | 0 refills | Status: DC

## 2022-12-05 MED ORDER — ACETAMINOPHEN 325 MG PO TABS
650.0000 mg | ORAL_TABLET | Freq: Once | ORAL | Status: AC
  Administered 2022-12-05: 650 mg via ORAL
  Filled 2022-12-05: qty 2

## 2022-12-05 MED ORDER — DARATUMUMAB-HYALURONIDASE-FIHJ 1800-30000 MG-UT/15ML ~~LOC~~ SOLN
1800.0000 mg | Freq: Once | SUBCUTANEOUS | Status: AC
  Administered 2022-12-05: 1800 mg via SUBCUTANEOUS
  Filled 2022-12-05: qty 15

## 2022-12-05 MED ORDER — DIPHENHYDRAMINE HCL 25 MG PO CAPS
50.0000 mg | ORAL_CAPSULE | Freq: Once | ORAL | Status: AC
  Administered 2022-12-05: 50 mg via ORAL
  Filled 2022-12-05: qty 2

## 2022-12-05 NOTE — Telephone Encounter (Signed)
Critical lab value reported:  Scr+ 3.75.  ESRD pt.  Notified Dr. Burr Medico.

## 2022-12-05 NOTE — Progress Notes (Signed)
[name]  [phone]) [phone] [phone]) [phone]   Name: Caleb Simmons Date: 12/05/2022 MRN: [id number]  DOB: February 04, 1951  Patient Care Team: Pcp, No as PCP - General [place], Lavonda Jumbo, RN (Inactive) as Registered Nurse [place], Carlena Sax, NP as Nurse Practitioner (Nurse Practitioner) Truitt Merle, MD as Attending Physician (Hematology and Oncology)    INTERVAL HISTORY: Caleb Simmons is a 71 y.o. 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  Recently admitted and discharged on 01/20/22 after receiving treatment for left MCA CVA. Palliative following for ongoing goals of care discussions and symptom management.   SOCIAL HISTORY:     reports that he has been smoking cigarettes. He has a 6.25 pack-year smoking history. He has never used smokeless tobacco.  ADVANCE DIRECTIVES:  Patient reports completed document.  Son [name], [name]. is his healthcare power of attorney.  CODE STATUS: Full code  PAST MEDICAL HISTORY:History reviewed. No pertinent past medical history.  ALLERGIES:  has No Known Allergies.  MEDICATIONS:  Current Outpatient Medications  Medication Sig Dispense Refill   oxyCODONE (OXYCONTIN) 15 mg 12 hr tablet Take 1 tablet (15 mg total) by mouth every 12 (twelve) hours. 60 tablet 0   acetaminophen (TYLENOL) 500 MG tablet Take 1,000 mg by mouth every 6 (six) hours as needed for moderate pain.     ALPRAZolam (XANAX) 1 MG tablet Take 1 tablet (1 mg total) by mouth every 8 (eight) hours as needed for anxiety or sleep. 90 tablet 0   aspirin 81 MG EC tablet Take 1 tablet (81 mg total) by mouth daily. Swallow whole. 30 tablet 11   cetirizine (ZYRTEC) 10 MG tablet Take 10 mg by mouth daily.     diclofenac Sodium (VOLTAREN) 1 % GEL Apply 1 Application topically 4 (four) times daily as needed (pain).      ezetimibe (ZETIA) 10 MG tablet Take 1 tablet (10 mg total) by mouth daily. 90 tablet 3   folic acid (FOLVITE) 1 MG tablet TAKE 2 TABLETS BY MOUTH EVERY DAY 60 tablet 0   Melatonin 10 MG TABS Take 10 mg by mouth at bedtime.     metoprolol succinate (TOPROL-XL) 25 MG 24 hr tablet TAKE 1/2 TABLET BY MOUTH EVERY DAY 15 tablet 4   midodrine (PROAMATINE) 5 MG tablet TAKE 1 TABLET BY MOUTH 3 TIMES DAILY WITH MEALS 270 tablet 3   mirtazapine (REMERON) 30 MG tablet TAKE 1 TABLET BY MOUTH AT BEDTIME 30 tablet 1   ondansetron (ZOFRAN) 4 MG tablet TAKE 1 TABLET BY MOUTH EVERY 8 HOURS AS NEEDED FOR NAUSEA AND VOMITING 45 tablet 2   oxyCODONE (ROXICODONE) 15 MG immediate release tablet Take 1 tablet (15 mg total) by mouth every 6 (six) hours as needed. 60 tablet 0   pantoprazole (PROTONIX) 40 MG tablet TAKE 1 TABLET BY MOUTH 2 TIMES DAILY BEFORE A meal 60 tablet 0   prochlorperazine (COMPAZINE) 5 MG tablet Take 1 tablet (5 mg total) by mouth every 6 (six) hours as needed for nausea or vomiting. (Patient taking differently: Take 5 mg by mouth every 4 (four) hours as needed for nausea or vomiting.) 30 tablet 2   SENEXON-S 8.6-50 MG tablet TAKE 1 TABLET BY MOUTH EVERY DAY 30 tablet 6   tamsulosin (FLOMAX) 0.4 MG CAPS capsule TAKE 1 CAPSULE BY MOUTH EVERY DAY 30 capsule 0   zolpidem (AMBIEN)  5 MG tablet Take 1 tablet (5 mg total) by mouth at bedtime as needed. for sleep 30 tablet 1   No current facility-administered medications for this visit.   Facility-Administered Medications Ordered in Other Visits  Medication Dose Route Frequency Provider Last Rate Last Admin   heparin lock flush 100 unit/mL  500 Units Intracatheter Once Truitt Merle, MD       sodium chloride flush (NS) 0.9 % injection 10 mL  10 mL Intracatheter Once Truitt Merle, MD        VITAL SIGNS: There were no vitals taken for this visit. There were no vitals filed for this visit.    Estimated body mass index is 21.52 kg/m as calculated from the  following:   Height as of an earlier encounter on 12/05/22: '5\' 10"'$  (1.778 m).   Weight as of an earlier encounter on 12/05/22: 150 lb (68 kg).   PERFORMANCE STATUS (ECOG) : 2 - Symptomatic, <50% confined to bed   IMPRESSION:  I saw [name] during his infusion. [name] is present. No acute distress. States he is feeling better than he has the past few weeks.  Is tolerating dialysis twice weekly. Continues t have difficulty with sleeping. He is taking ambien and mirtazapine. We discussed nightly regimen with recommendations provided.   Pain [name] continues to endorse generalized pain. States some days are better than others however on his worst days pain sometimes keep him from being active. We discussed his current regimen. He is tolerating xtampza '9mg'$  however is not able to identify much of a difference. Oxy IR as needed. Education provided regarding medication adjustment. Patient and family verbalized understanding.    Education provided on continued use of breakthrough medication. Also discussed use of oxycodone ER. Education provided on possible side effects.   We will continue to closely monitor.    Decreased Appetite Tolerating Remeron 30 mg. Appetite has improved. Weight has increased. Does decrease some around treatment days. [name] understands we will continue to closely monitor his appetite and weight loss.  He is being followed by the Dietician.   Current weight is 150lbs up from 142lbs on 11/16.   Constipation Is actively taking Miralax daily for bowel regimen with Senna.  Some loose stools.  Encouraged to closely monitor.  If having diarrhea would recommend decreasing senna to 1 tablet daily.  4.  Anxiety [name] complains of ongoing anxiety.  Anxiety is now much more controlled on current regimen.  He is taking Xanax as prescribed confirming he is not taking as often as he was previously.  We will continue to closely monitor.     PLAN: Oxy IR 5-10 mg every 4-6 hours  as needed for breakthrough pain Oxycontin 15 mg every 12 hours.  Extensive education provided on starting medication and potential side effects. Will closely monitor appetite and make adjustments if no increase or further weight decline. Education provided on small frequent meals. Followed by Dietician. Weight increased.  Xanax 1 mg every 8 hours as needed for anxiety/sleep Miralax twice daily Senna-S daily Zofran as needed for nausea.  I will plan to see him back in 3-4 weeks in collaboration with his other oncology appointments.   Patient expressed understanding and was in agreement with this plan. He also understands that He can call the clinic at any time with any questions, concerns, or complaints.      Any controlled substances utilized were prescribed in the context of palliative care. PDMP has been reviewed.  Time Total: 40 min   Visit consisted of counseling and education dealing with the complex and emotionally intense issues of symptom management and palliative care in the setting of serious and potentially life-threatening illness.Greater than 50%  of this time was spent counseling and coordinating care related to the above assessment and plan.  Alda Lea, AGPCNP-BC  Palliative Medicine Team/[facility]

## 2022-12-06 ENCOUNTER — Telehealth: Payer: Self-pay

## 2022-12-06 LAB — KAPPA/LAMBDA LIGHT CHAINS
Kappa free light chain: 40.1 mg/L — ABNORMAL HIGH (ref 3.3–19.4)
Kappa, lambda light chain ratio: 1.97 — ABNORMAL HIGH (ref 0.26–1.65)
Lambda free light chains: 20.4 mg/L (ref 5.7–26.3)

## 2022-12-06 NOTE — Telephone Encounter (Signed)
Pt SO called reporting they "did not have the correct medication" RN educated on the difference between oxy IR and oxycontin, pt SO verbalized understanding and repeated how to take each medication back to RN. No further questions at this point.

## 2022-12-06 NOTE — Progress Notes (Signed)
Nutrition Assessment   Reason for Assessment: Poor appetite   ASSESSMENT: 71 year old male AL amyloidosis/plasma cell myeloma.  Past medical history includes orthostatic hypotension, CHF, anemia, ESRD on HD (MW), anxiety.  Noted recent hospital admission for MCA CVA.   Met with patient during infusion. He reports feeling tired today and is ready to go home. Patient had HD yesterday. Says he has little energy the day after. Patient reports appetite is some better. He is taking appetite stimulant (remeron). Wife says he is eating 3 small meals. He does not drink supplements as he does not like the taste of them. Patient reports constipation has improved.    Nutrition Focused Physical Exam: Unable to complete    Medications: oxycodone, folvite, remeron, zofran, oxycodone, protonix, compazine, ambien, flomax, xanax, midodrine   Labs: glucose 107, BUN 26, Cr 3.75, albumin 3.4   Anthropometrics: Pt is unsure of dry wt - states this has not been discussed at HD  Height: 5'10" Weight: 150 lb  UBW: 155 -160 lb (per pt) BMI: 21.52    NUTRITION DIAGNOSIS: Inadequate oral intake related to chronic illness (myeloma, ESRD on HD) as evidenced by reported poor appetite and dietary recall   INTERVENTION:  Encouraged small frequent meals and snacks with adequate calories and protein Discussed alternate ONS options since he does not like Ensure/Boost - provided samples of CIB powder, instructed to mix with one cup milk and drink 2-3/day    MONITORING, EVALUATION, GOAL: Patient will tolerate increased calories and protein to minimize further weight loss    Next Visit: Thursday January 11 during infusion

## 2022-12-07 ENCOUNTER — Telehealth: Payer: Self-pay | Admitting: Hematology

## 2022-12-07 NOTE — Telephone Encounter (Signed)
Called patient regarding upcoming appointment unable to leave voicemail

## 2022-12-10 LAB — MULTIPLE MYELOMA PANEL, SERUM
Albumin SerPl Elph-Mcnc: 3.1 g/dL (ref 2.9–4.4)
Albumin/Glob SerPl: 1.3 (ref 0.7–1.7)
Alpha 1: 0.4 g/dL (ref 0.0–0.4)
Alpha2 Glob SerPl Elph-Mcnc: 1 g/dL (ref 0.4–1.0)
B-Globulin SerPl Elph-Mcnc: 0.7 g/dL (ref 0.7–1.3)
Gamma Glob SerPl Elph-Mcnc: 0.3 g/dL — ABNORMAL LOW (ref 0.4–1.8)
Globulin, Total: 2.4 g/dL (ref 2.2–3.9)
IgA: 42 mg/dL — ABNORMAL LOW (ref 61–437)
IgG (Immunoglobin G), Serum: 338 mg/dL — ABNORMAL LOW (ref 603–1613)
IgM (Immunoglobulin M), Srm: 65 mg/dL (ref 15–143)
Total Protein ELP: 5.5 g/dL — ABNORMAL LOW (ref 6.0–8.5)

## 2022-12-11 ENCOUNTER — Encounter: Payer: Self-pay | Admitting: Hematology

## 2022-12-13 ENCOUNTER — Other Ambulatory Visit (HOSPITAL_COMMUNITY): Payer: Self-pay | Admitting: Cardiology

## 2022-12-13 ENCOUNTER — Other Ambulatory Visit: Payer: Self-pay | Admitting: Hematology

## 2022-12-17 ENCOUNTER — Other Ambulatory Visit: Payer: Self-pay

## 2022-12-17 DIAGNOSIS — G893 Neoplasm related pain (acute) (chronic): Secondary | ICD-10-CM

## 2022-12-17 DIAGNOSIS — E854 Organ-limited amyloidosis: Secondary | ICD-10-CM

## 2022-12-17 DIAGNOSIS — Z515 Encounter for palliative care: Secondary | ICD-10-CM

## 2022-12-17 DIAGNOSIS — F419 Anxiety disorder, unspecified: Secondary | ICD-10-CM

## 2022-12-17 MED ORDER — OXYCODONE HCL 15 MG PO TABS: 15.0000 mg | ORAL_TABLET | Freq: Four times a day (QID) | ORAL | 0 refills | Status: DC | PRN

## 2022-12-17 MED ORDER — ALPRAZOLAM 1 MG PO TABS: 1.0000 mg | ORAL_TABLET | Freq: Three times a day (TID) | ORAL | 0 refills | Status: DC | PRN

## 2022-12-17 NOTE — Telephone Encounter (Signed)
Pt SO called for pain med refill, see new orders.

## 2022-12-19 ENCOUNTER — Other Ambulatory Visit: Payer: Self-pay

## 2022-12-19 ENCOUNTER — Emergency Department (HOSPITAL_COMMUNITY)
Admission: EM | Admit: 2022-12-19 | Discharge: 2022-12-19 | Disposition: A | Discharge status: Home / Self Care | Payer: Commercial Managed Care - PPO | Attending: Emergency Medicine | Admitting: Emergency Medicine

## 2022-12-19 ENCOUNTER — Encounter (HOSPITAL_COMMUNITY): Payer: Self-pay | Admitting: Emergency Medicine

## 2022-12-19 ENCOUNTER — Emergency Department (HOSPITAL_COMMUNITY): Payer: Commercial Managed Care - PPO

## 2022-12-19 ENCOUNTER — Telehealth: Payer: Self-pay

## 2022-12-19 DIAGNOSIS — Z8616 Personal history of COVID-19: Secondary | ICD-10-CM | POA: Insufficient documentation

## 2022-12-19 DIAGNOSIS — Z7982 Long term (current) use of aspirin: Secondary | ICD-10-CM | POA: Diagnosis not present

## 2022-12-19 DIAGNOSIS — I160 Hypertensive urgency: Secondary | ICD-10-CM | POA: Diagnosis not present

## 2022-12-19 DIAGNOSIS — I509 Heart failure, unspecified: Secondary | ICD-10-CM | POA: Insufficient documentation

## 2022-12-19 DIAGNOSIS — Z87891 Personal history of nicotine dependence: Secondary | ICD-10-CM | POA: Insufficient documentation

## 2022-12-19 DIAGNOSIS — I132 Hypertensive heart and chronic kidney disease with heart failure and with stage 5 chronic kidney disease, or end stage renal disease: Secondary | ICD-10-CM | POA: Insufficient documentation

## 2022-12-19 DIAGNOSIS — Z79899 Other long term (current) drug therapy: Secondary | ICD-10-CM | POA: Insufficient documentation

## 2022-12-19 DIAGNOSIS — Z992 Dependence on renal dialysis: Secondary | ICD-10-CM | POA: Diagnosis not present

## 2022-12-19 DIAGNOSIS — U071 COVID-19: Secondary | ICD-10-CM | POA: Diagnosis not present

## 2022-12-19 DIAGNOSIS — Z85520 Personal history of malignant carcinoid tumor of kidney: Secondary | ICD-10-CM | POA: Diagnosis not present

## 2022-12-19 DIAGNOSIS — N186 End stage renal disease: Secondary | ICD-10-CM | POA: Diagnosis not present

## 2022-12-19 DIAGNOSIS — R0602 Shortness of breath: Secondary | ICD-10-CM | POA: Diagnosis present

## 2022-12-19 HISTORY — DX: Malignant (primary) neoplasm, unspecified: C80.1

## 2022-12-19 LAB — RESP PANEL BY RT-PCR (RSV, FLU A&B, COVID)  RVPGX2
Influenza A by PCR: NEGATIVE
Influenza B by PCR: NEGATIVE
Resp Syncytial Virus by PCR: NEGATIVE
SARS Coronavirus 2 by RT PCR: POSITIVE — AB

## 2022-12-19 LAB — CBC WITH DIFFERENTIAL/PLATELET
Abs Immature Granulocytes: 0.03 10*3/uL (ref 0.00–0.07)
Basophils Absolute: 0 10*3/uL (ref 0.0–0.1)
Basophils Relative: 1 %
Eosinophils Absolute: 0 10*3/uL (ref 0.0–0.5)
Eosinophils Relative: 1 %
HCT: 30.4 % — ABNORMAL LOW (ref 39.0–52.0)
Hemoglobin: 10 g/dL — ABNORMAL LOW (ref 13.0–17.0)
Immature Granulocytes: 0 %
Lymphocytes Relative: 14 %
Lymphs Abs: 0.9 10*3/uL (ref 0.7–4.0)
MCH: 29.9 pg (ref 26.0–34.0)
MCHC: 32.9 g/dL (ref 30.0–36.0)
MCV: 91 fL (ref 80.0–100.0)
Monocytes Absolute: 0.3 10*3/uL (ref 0.1–1.0)
Monocytes Relative: 5 %
Neutro Abs: 5.5 10*3/uL (ref 1.7–7.7)
Neutrophils Relative %: 79 %
Platelets: 149 10*3/uL — ABNORMAL LOW (ref 150–400)
RBC: 3.34 MIL/uL — ABNORMAL LOW (ref 4.22–5.81)
RDW: 16.3 % — ABNORMAL HIGH (ref 11.5–15.5)
WBC: 6.9 10*3/uL (ref 4.0–10.5)
nRBC: 0 % (ref 0.0–0.2)

## 2022-12-19 LAB — URINALYSIS, ROUTINE W REFLEX MICROSCOPIC
Bacteria, UA: NONE SEEN
Bilirubin Urine: NEGATIVE
Glucose, UA: NEGATIVE mg/dL
Hgb urine dipstick: NEGATIVE
Ketones, ur: NEGATIVE mg/dL
Leukocytes,Ua: NEGATIVE
Nitrite: NEGATIVE
Protein, ur: >=300 mg/dL — AB
Specific Gravity, Urine: 1.008 (ref 1.005–1.030)
pH: 7 (ref 5.0–8.0)

## 2022-12-19 LAB — BASIC METABOLIC PANEL
Anion gap: 8 (ref 5–15)
BUN: 28 mg/dL — ABNORMAL HIGH (ref 8–23)
CO2: 24 mmol/L (ref 22–32)
Calcium: 8 mg/dL — ABNORMAL LOW (ref 8.9–10.3)
Chloride: 107 mmol/L (ref 98–111)
Creatinine, Ser: 4.26 mg/dL — ABNORMAL HIGH (ref 0.61–1.24)
GFR, Estimated: 14 mL/min — ABNORMAL LOW (ref >60)
Glucose, Bld: 108 mg/dL — ABNORMAL HIGH (ref 70–99)
Potassium: 4.2 mmol/L (ref 3.5–5.1)
Sodium: 139 mmol/L (ref 135–145)

## 2022-12-19 LAB — BRAIN NATRIURETIC PEPTIDE: B Natriuretic Peptide: 2627 pg/mL — ABNORMAL HIGH (ref 0.0–100.0)

## 2022-12-19 MED ORDER — FUROSEMIDE 10 MG/ML IJ SOLN
40.0000 mg | Freq: Once | INTRAMUSCULAR | Status: AC
  Administered 2022-12-19: 40 mg via INTRAVENOUS
  Filled 2022-12-19: qty 4

## 2022-12-19 MED ORDER — MOLNUPIRAVIR EUA 200MG CAPSULE: 4.0000 | ORAL_CAPSULE | Freq: Two times a day (BID) | ORAL | 0 refills | Status: AC

## 2022-12-19 MED ORDER — HYDRALAZINE HCL 20 MG/ML IJ SOLN
20.0000 mg | Freq: Once | INTRAMUSCULAR | Status: AC
  Administered 2022-12-19: 20 mg via INTRAVENOUS
  Filled 2022-12-19: qty 1

## 2022-12-19 NOTE — Telephone Encounter (Signed)
Pt SO, Caleb Simmons, called reporting she was positive for Covid and now the pt, Caleb Simmons, is having severe respiratory symptoms, though not been tested for Covid. Pt is complaining of severe SOB, cough, congestion/sinus pressure, and diarrhea. This RN educated that since [place] is Covid positive and Mr.Plamondon is very symptomatic they cannot come to the cancer center and instead need to go to an urgent care or ED. [name] voiced understanding, no further questions at this time.

## 2022-12-19 NOTE — ED Provider Triage Note (Signed)
Emergency Medicine Provider Triage Evaluation Note  Caleb Simmons , a 71 y.o. male  was evaluated in triage.  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  Denies worsening dyspnea on exertion or laying flat.  Review of Systems  Positive: Shortness of breath Negative: Fever, chills, chest pain  Physical Exam  BP (!) 171/103   Pulse 79   Temp 98.2 F (36.8 C)   Resp 16   Ht '5\' 10"'$  (1.778 m)   Wt 63.5 kg   SpO2 99%   BMI 20.09 kg/m  Gen:   Awake, no distress   Resp:  Normal effort  MSK:   Moves extremities without difficulty  Other:  Patient with somewhat shallow respirations but no accessory breath sounds, rhonchi, wheezing, stridor, rales  Medical Decision Making  Medically screening exam initiated at 10:31 AM.  Appropriate orders placed.  Caleb Simmons was informed that the remainder of the evaluation will be completed by another provider, this initial triage assessment does not replace that evaluation, and the importance of remaining in the ED until their evaluation is complete.  Workup initiated   Caleb Simmons, [place] 12/19/22 1033

## 2022-12-19 NOTE — ED Triage Notes (Signed)
Pt. Stated, I woke up his morning SOB , this is new. I've has head congestion but I keep that. Its like I can't get enough oxygen.

## 2022-12-19 NOTE — Discharge Instructions (Signed)
Please present for your dialysis as scheduled tomorrow morning, if you have any difficulty obtaining dialysis, you have worsening shortness of breath, develop chest pain please return to the emergency department for further evaluation and treatment

## 2022-12-19 NOTE — ED Notes (Signed)
PA Prosperi notified and aware of rising BP.  No new orders at this time.

## 2022-12-19 NOTE — ED Notes (Signed)
Ambulated pt to blanket warmer SpO2 at 95% continued to walk around nursing station pt SpO2 decrease to 88% entering pt room and back to bed SpO2 at 94%.

## 2022-12-19 NOTE — ED Provider Notes (Signed)
[facility] Provider Note   CSN: [id number] Arrival date & time: 12/19/22  2409     History  Chief Complaint  Patient presents with   Shortness of Breath   Nasal Congestion    [facility] is a 71 y.o. male with past medical history significant for renal cell carcinoma with metastasis, currently undergoing chemotherapy, secondary to significant kidney injury, twice weekly dialysis on Monday, Friday.  Patient had abnormal dialysis schedule secondary to Christmas, is due for dialysis tomorrow.  He denies fever, chills, and has stable oxygen saturation in the emergency department today.  Patient reporting that just feels difficulty breathing.  He still produces urine.  Mentating without difficulty.   Shortness of Breath      Home Medications Prior to Admission medications   Medication Sig Start Date End Date Taking? Authorizing Provider  molnupiravir EUA (LAGEVRIO) 200 mg CAPS capsule Take 4 capsules (800 mg total) by mouth 2 (two) times daily for 5 days. 12/19/22 12/24/22 Yes Vere Diantonio H, PA-C  acetaminophen (TYLENOL) 500 MG tablet Take 1,000 mg by mouth every 6 (six) hours as needed for moderate pain.    [provider]  ALPRAZolam Duanne Moron) 1 MG tablet Take 1 tablet (1 mg total) by mouth every 8 (eight) hours as needed for anxiety or sleep. 12/17/22   Pickenpack-Cousar, Carlena Sax, NP  aspirin 81 MG EC tablet Take 1 tablet (81 mg total) by mouth daily. Swallow whole. 01/20/22   Mercy Riding, MD  cetirizine (ZYRTEC) 10 MG tablet Take 10 mg by mouth daily.    [provider]  diclofenac Sodium (VOLTAREN) 1 % GEL Apply 1 Application topically 4 (four) times daily as needed (pain).    [provider]  ezetimibe (ZETIA) 10 MG tablet Take 1 tablet (10 mg total) by mouth daily. 08/13/22   Larey Dresser, MD  folic acid (FOLVITE) 1 MG tablet TAKE 2 TABLETS BY MOUTH EVERY DAY 12/13/22   Truitt Merle, MD  Melatonin 10 MG  TABS Take 10 mg by mouth at bedtime.    [provider]  metoprolol succinate (TOPROL-XL) 25 MG 24 hr tablet TAKE 1/2 TABLET BY MOUTH EVERY DAY 12/13/22   Larey Dresser, MD  midodrine (PROAMATINE) 5 MG tablet TAKE 1 TABLET BY MOUTH 3 TIMES DAILY WITH MEALS 08/28/22   Larey Dresser, MD  mirtazapine (REMERON) 30 MG tablet TAKE 1 TABLET BY MOUTH AT BEDTIME 11/19/22   Pickenpack-Cousar, Carlena Sax, NP  ondansetron (ZOFRAN) 4 MG tablet TAKE 1 TABLET BY MOUTH EVERY 8 HOURS AS NEEDED FOR NAUSEA AND VOMITING 07/18/22   Truitt Merle, MD  oxyCODONE (OXYCONTIN) 15 mg 12 hr tablet Take 1 tablet (15 mg total) by mouth every 12 (twelve) hours. 12/05/22   Pickenpack-Cousar, Carlena Sax, NP  oxyCODONE (ROXICODONE) 15 MG immediate release tablet Take 1 tablet (15 mg total) by mouth every 6 (six) hours as needed. 12/17/22   Pickenpack-Cousar, Carlena Sax, NP  pantoprazole (PROTONIX) 40 MG tablet TAKE 1 TABLET BY MOUTH 2 TIMES DAILY BEFORE A MEAL 12/13/22   Truitt Merle, MD  prochlorperazine (COMPAZINE) 5 MG tablet Take 1 tablet (5 mg total) by mouth every 6 (six) hours as needed for nausea or vomiting. Patient taking differently: Take 5 mg by mouth every 4 (four) hours as needed for nausea or vomiting. 03/28/22   Pickenpack-Cousar, Carlena Sax, NP  SENEXON-S 8.6-50 MG tablet TAKE 1 TABLET BY MOUTH EVERY DAY 10/22/22   Pickenpack-Cousar, Chesley Noon  N, NP  tamsulosin (FLOMAX) 0.4 MG CAPS capsule TAKE 1 CAPSULE BY MOUTH EVERY DAY 12/13/22   Truitt Merle, MD  zolpidem (AMBIEN) 5 MG tablet Take 1 tablet (5 mg total) by mouth at bedtime as needed. for sleep 12/02/22   Pickenpack-Cousar, Carlena Sax, NP      Allergies    Patient has no known allergies.    Review of Systems   Review of Systems  Respiratory:  Positive for shortness of breath.   All other systems reviewed and are negative.   Physical Exam Updated Vital Signs BP (!) 194/126   Pulse 88   Temp 97.7 F (36.5 C) (Oral)   Resp 20   Ht '5\' 10"'$  (1.778 m)   Wt 63.5 kg    SpO2 97%   BMI 20.09 kg/m  Physical Exam Vitals and nursing note reviewed.  Constitutional:      General: He is not in acute distress.    Appearance: Normal appearance. He is ill-appearing.     Comments: Chronically ill appearing  HENT:     Head: Normocephalic and atraumatic.  Eyes:     General:        Right eye: No discharge.        Left eye: No discharge.  Cardiovascular:     Rate and Rhythm: Normal rate and regular rhythm.     Heart sounds: No murmur heard.    No friction rub. No gallop.  Pulmonary:     Effort: Pulmonary effort is normal.     Breath sounds: Normal breath sounds.     Comments: Very mild crackles at lung bases, no acute respiratory distress Abdominal:     General: Bowel sounds are normal.     Palpations: Abdomen is soft.  Musculoskeletal:     Comments: No edema bilateral lower extremities  Skin:    General: Skin is warm and dry.     Capillary Refill: Capillary refill takes less than 2 seconds.  Neurological:     Mental Status: He is alert and oriented to person, place, and time.  Psychiatric:        Mood and Affect: Mood normal.        Behavior: Behavior normal.     ED Results / Procedures / Treatments   Labs (all labs ordered are listed, but only abnormal results are displayed) Labs Reviewed  RESP PANEL BY RT-PCR (RSV, FLU A&B, COVID)  RVPGX2 - Abnormal; Notable for the following components:      Result Value   SARS Coronavirus 2 by RT PCR POSITIVE (*)    All other components within normal limits  BASIC METABOLIC PANEL - Abnormal; Notable for the following components:   Glucose, Bld 108 (*)    BUN 28 (*)    Creatinine, Ser 4.26 (*)    Calcium 8.0 (*)    GFR, Estimated 14 (*)    All other components within normal limits  CBC WITH DIFFERENTIAL/PLATELET - Abnormal; Notable for the following components:   RBC 3.34 (*)    Hemoglobin 10.0 (*)    HCT 30.4 (*)    RDW 16.3 (*)    Platelets 149 (*)    All other components within normal limits   BRAIN NATRIURETIC PEPTIDE - Abnormal; Notable for the following components:   B Natriuretic Peptide 2,627.0 (*)    All other components within normal limits  URINALYSIS, ROUTINE W REFLEX MICROSCOPIC - Abnormal; Notable for the following components:   Color, Urine STRAW (*)  Protein, ur >=300 (*)    All other components within normal limits    EKG None  Radiology DG Chest 2 View  Result Date: 12/19/2022 CLINICAL DATA:  Acute onset increasing shortness of breath and nasal congestion. Patient is currently on chemotherapy for metastatic cancer. EXAM: CHEST - 2 VIEW COMPARISON:  Chest radiograph dated 01/14/2022 FINDINGS: Lines/tubes: Central venous catheter tip projects over the superior cavoatrial junction. Chest: No focal consolidations. Rounded nodular density projecting over the lower lobe on lateral view likely reflects superimposition of structures, similar to prior examination. Pleura: Blunting of left costophrenic angle. Heart/mediastinum: Enlarged cardiomediastinal silhouette is likely projectional. Bones: No acute osseous abnormality. Multiple old left posterior rib fractures. IMPRESSION: 1. Blunting of the left costophrenic angle which may represent a small pleural effusion. 2. Rounded nodular density projecting over the lower lobe on lateral view likely reflects superimposition of structures, similar to prior examination. Attention on follow-up. 3.  Enlarged cardiomediastinal silhouette is likely projectional. Electronically Signed   By: Darrin Nipper M.D.   On: 12/19/2022 11:14    Procedures Procedures    Medications Ordered in ED Medications  furosemide (LASIX) injection 40 mg (40 mg Intravenous Given 12/19/22 1433)  hydrALAZINE (APRESOLINE) injection 20 mg (20 mg Intravenous Given 12/19/22 1804)    ED Course/ Medical Decision Making/ A&P Clinical Course as of 12/19/22 1849  Thu Dec 19, 2022  1649 Patient RVP positive for COVID, not having clear upper respiratory infectious  symptoms, or tentative plan was to discharge to follow-up for dialysis if stable oxygen saturation on ambulation, will consult with attending to ensure there comfortable with this plan [CP]    Clinical Course User Index [CP] Anselmo Pickler, PA-C                            Medical Decision Making Amount and/or Complexity of Data Reviewed Labs: ordered. Decision-making details documented in ED Course. Radiology: ordered.  Risk Prescription drug management.   This patient is a 71 y.o. male who presents to the ED for concern of shortness of breath, nasal congestion, this involves an extensive number of treatment options, and is a complaint that carries with it a high risk of complications and morbidity. The emergent differential diagnosis prior to evaluation includes, but is not limited to,  asthma exacerbation, COPD exacerbation, acute upper respiratory infection, acute bronchitis, chronic bronchitis, interstitial lung disease, ARDS, PE, pneumonia, atypical ACS, carbon monoxide poisoning, spontaneous pneumothorax versus other .   This is not an exhaustive differential.   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 Pertinent results include: extensively reviewed previous admission including labwork, imaging, outpatient oncology as well as palliative care visits  Physical Exam: Physical exam performed. The pertinent findings include: Patient with some crackles at lung bases but no acute respiratory distress, he has stable oxygen saturation on room air  Lab Tests: I ordered, and personally interpreted labs.  The pertinent results include: Patient was stable on chronic renal failure, no evidence of acute hyperkalemia, patient with significantly elevated BNP, 2627, which is in keeping with his overall physical exam, and evidence of  pleural effusion on chest x-ray.   Imaging Studies: I ordered imaging studies including plain film chest x-ray. I independently visualized and interpreted imaging which showed slight blunting of left costophrenic angle suggestive of small pleural effusion.  I agree with the radiologist interpretation.   Cardiac Monitoring:  The patient was maintained on a cardiac monitor.  My attending physician Dr. Maryan Rued viewed and interpreted the cardiac monitored which showed an underlying rhythm of: NSR, with arrhythmia, no evidence of acute ischemia. I agree with this interpretation.   Medications: I ordered medication including lasix  for heart failure exacerbation, hydralazine for hypertension.  Patient with mild improvement of his overall shortness of breath, his blood pressures have remained high, however I think that this will significantly improve with dialysis, I think that the primary treatment patient would benefit from at this time with fluid overload, hypertension, and his known ESRD on dialysis is dialysis at this time.   Disposition: After consideration of the diagnostic results and the patients response to treatment, I feel that patient is stable for discharge at this time.  He had a brief desaturation on ambulatory pulse ox to 88%.  However he has been stable on room air for the duration of the rest of his visit.  He is afebrile, he has mild crackles in the lung bases but is not in respiratory distress.  Discussed with patient at length about plan of care, considered admission to the hospital for dialysis, blood pressure control, and monitoring of COVID symptoms, however as patient is in no respiratory distress, afebrile, and otherwise at his baseline at this time after extended discussion with patient and family they feel comfortable with discharge to dialysis tomorrow morning, especially as he is unlikely to receive dialysis for several hours in the emergency department or after admission I do  think that this is a reasonable plan, discussed extensive return precautions for worsening shortness of breath, chest pain, headache, vision changes, numbness, tingling prior to being able to receive dialysis for ongoing significant respiratory distress and poorly controlled hypertension even after dialysis.   emergency department workup does not suggest an emergent condition requiring admission or immediate intervention beyond what has been performed at this time. The plan is: After extensive shared decision-making despite brief desaturation on ambulatory pulse ox at 88%, and signs of acute CHF exacerbation, we reached the shared decision to discharge patient home to dialysis as I think that this will improve the majority of the symptoms that he has at this time, patient and family member are amenable to this plan. The patient is safe for discharge and has been instructed to return immediately for worsening symptoms, change in symptoms or any other concerns.  I discussed this case with my attending physician Dr. Maryan Rued who cosigned this note including patient's presenting symptoms, physical exam, and planned diagnostics and interventions. Attending physician stated agreement with plan or made changes to plan which were implemented.    Attending physician assessed patient at bedside.  Final Clinical Impression(s) / ED Diagnoses Final diagnoses:  Hypertensive urgency  Acute on chronic congestive heart failure, unspecified heart failure type ([place])  COVID-19    Rx / DC Orders ED Discharge Orders          Ordered    molnupiravir EUA (LAGEVRIO) 200 mg CAPS capsule  2 times daily        12/19/22 1813              Chloeanne Poteet, Jackalyn Lombard 12/19/22 1849    Blanchie Dessert, MD 12/23/22 1233

## 2022-12-27 ENCOUNTER — Telehealth: Payer: Self-pay | Admitting: Nurse Practitioner

## 2022-12-27 ENCOUNTER — Telehealth: Payer: Self-pay

## 2022-12-27 ENCOUNTER — Encounter: Payer: Self-pay | Admitting: Nurse Practitioner

## 2022-12-27 NOTE — Telephone Encounter (Signed)
Pt daughter called and requested that Lexine Baton, NP, get in contact to speak with [name] and Antony Haste. Per [name] pt has had covid and is still symptomatic (SOB, coughing, phlegm, and weakness),and he is has declined since the last time he was seen in office. Per [name] pt is still exhibiting covid symptoms and pt is also not eating or drinking, is incredibly weak and has lost a lot of weight. Per Lexine Baton, pt should go to the ED to be evaluated. [name] verbalized understanding and reports that she will talk with the pt and see if he could get him to come in to be seen.

## 2022-12-27 NOTE — Telephone Encounter (Signed)
Attempted to contact patient. No answer voicemail left.

## 2022-12-31 ENCOUNTER — Telehealth: Payer: Self-pay | Admitting: Hematology

## 2022-12-31 NOTE — Telephone Encounter (Signed)
Patient aware of appointment changes on 1/11

## 2023-01-02 ENCOUNTER — Encounter: Payer: Self-pay | Admitting: Nurse Practitioner

## 2023-01-02 ENCOUNTER — Inpatient Hospital Stay: Payer: Commercial Managed Care - PPO

## 2023-01-02 ENCOUNTER — Inpatient Hospital Stay: Payer: Commercial Managed Care - PPO | Admitting: Nurse Practitioner

## 2023-01-02 ENCOUNTER — Other Ambulatory Visit: Payer: Self-pay | Admitting: Hematology

## 2023-01-02 ENCOUNTER — Telehealth: Payer: Self-pay

## 2023-01-02 ENCOUNTER — Inpatient Hospital Stay: Payer: Commercial Managed Care - PPO | Attending: Hematology

## 2023-01-02 ENCOUNTER — Inpatient Hospital Stay: Payer: Commercial Managed Care - PPO | Admitting: Dietician

## 2023-01-02 ENCOUNTER — Inpatient Hospital Stay (HOSPITAL_BASED_OUTPATIENT_CLINIC_OR_DEPARTMENT_OTHER): Payer: Commercial Managed Care - PPO | Admitting: Nurse Practitioner

## 2023-01-02 VITALS — BP 115/80 | HR 83 | Temp 98.0°F | Resp 19 | Wt 135.3 lb

## 2023-01-02 DIAGNOSIS — E8581 Light chain (AL) amyloidosis: Secondary | ICD-10-CM

## 2023-01-02 DIAGNOSIS — Z515 Encounter for palliative care: Secondary | ICD-10-CM

## 2023-01-02 DIAGNOSIS — I43 Cardiomyopathy in diseases classified elsewhere: Secondary | ICD-10-CM

## 2023-01-02 DIAGNOSIS — G893 Neoplasm related pain (acute) (chronic): Secondary | ICD-10-CM | POA: Diagnosis not present

## 2023-01-02 DIAGNOSIS — Z5112 Encounter for antineoplastic immunotherapy: Secondary | ICD-10-CM | POA: Insufficient documentation

## 2023-01-02 DIAGNOSIS — I951 Orthostatic hypotension: Secondary | ICD-10-CM | POA: Insufficient documentation

## 2023-01-02 DIAGNOSIS — N186 End stage renal disease: Secondary | ICD-10-CM | POA: Diagnosis not present

## 2023-01-02 DIAGNOSIS — C9000 Multiple myeloma not having achieved remission: Secondary | ICD-10-CM | POA: Diagnosis present

## 2023-01-02 DIAGNOSIS — R110 Nausea: Secondary | ICD-10-CM | POA: Diagnosis not present

## 2023-01-02 DIAGNOSIS — Z992 Dependence on renal dialysis: Secondary | ICD-10-CM | POA: Diagnosis not present

## 2023-01-02 DIAGNOSIS — E854 Organ-limited amyloidosis: Secondary | ICD-10-CM | POA: Diagnosis not present

## 2023-01-02 LAB — CBC WITH DIFFERENTIAL/PLATELET
Abs Immature Granulocytes: 0.03 10*3/uL (ref 0.00–0.07)
Basophils Absolute: 0 10*3/uL (ref 0.0–0.1)
Basophils Relative: 0 %
Eosinophils Absolute: 0 10*3/uL (ref 0.0–0.5)
Eosinophils Relative: 1 %
HCT: 30.2 % — ABNORMAL LOW (ref 39.0–52.0)
Hemoglobin: 9.9 g/dL — ABNORMAL LOW (ref 13.0–17.0)
Immature Granulocytes: 0 %
Lymphocytes Relative: 13 %
Lymphs Abs: 0.9 10*3/uL (ref 0.7–4.0)
MCH: 28.9 pg (ref 26.0–34.0)
MCHC: 32.8 g/dL (ref 30.0–36.0)
MCV: 88.3 fL (ref 80.0–100.0)
Monocytes Absolute: 0.3 10*3/uL (ref 0.1–1.0)
Monocytes Relative: 5 %
Neutro Abs: 5.6 10*3/uL (ref 1.7–7.7)
Neutrophils Relative %: 81 %
Platelets: 150 10*3/uL (ref 150–400)
RBC: 3.42 MIL/uL — ABNORMAL LOW (ref 4.22–5.81)
RDW: 15.7 % — ABNORMAL HIGH (ref 11.5–15.5)
WBC: 6.9 10*3/uL (ref 4.0–10.5)
nRBC: 0 % (ref 0.0–0.2)

## 2023-01-02 LAB — COMPREHENSIVE METABOLIC PANEL
ALT: 6 U/L (ref 0–44)
AST: 10 U/L — ABNORMAL LOW (ref 15–41)
Albumin: 3.2 g/dL — ABNORMAL LOW (ref 3.5–5.0)
Alkaline Phosphatase: 77 U/L (ref 38–126)
Anion gap: 9 (ref 5–15)
BUN: 39 mg/dL — ABNORMAL HIGH (ref 8–23)
CO2: 26 mmol/L (ref 22–32)
Calcium: 8.6 mg/dL — ABNORMAL LOW (ref 8.9–10.3)
Chloride: 104 mmol/L (ref 98–111)
Creatinine, Ser: 3.76 mg/dL (ref 0.61–1.24)
GFR, Estimated: 16 mL/min — ABNORMAL LOW (ref >60)
Glucose, Bld: 101 mg/dL — ABNORMAL HIGH (ref 70–99)
Potassium: 3.7 mmol/L (ref 3.5–5.1)
Sodium: 139 mmol/L (ref 135–145)
Total Bilirubin: 0.4 mg/dL (ref 0.3–1.2)
Total Protein: 5.8 g/dL — ABNORMAL LOW (ref 6.5–8.1)

## 2023-01-02 MED ORDER — DEXAMETHASONE 4 MG PO TABS
20.0000 mg | ORAL_TABLET | Freq: Once | ORAL | Status: AC
  Administered 2023-01-02: 20 mg via ORAL
  Filled 2023-01-02: qty 5

## 2023-01-02 MED ORDER — DIPHENHYDRAMINE HCL 25 MG PO CAPS
50.0000 mg | ORAL_CAPSULE | Freq: Once | ORAL | Status: AC
  Administered 2023-01-02: 50 mg via ORAL
  Filled 2023-01-02: qty 2

## 2023-01-02 MED ORDER — OXYCODONE HCL 15 MG PO TABS: 15.0000 mg | ORAL_TABLET | Freq: Four times a day (QID) | ORAL | 0 refills | Status: DC | PRN

## 2023-01-02 MED ORDER — ONDANSETRON HCL 4 MG PO TABS: ORAL_TABLET | ORAL | 2 refills | Status: DC

## 2023-01-02 MED ORDER — DEXAMETHASONE 2 MG PO TABS: 2.0000 mg | ORAL_TABLET | Freq: Every day | ORAL | 0 refills | Status: DC

## 2023-01-02 MED ORDER — ACETAMINOPHEN 325 MG PO TABS
650.0000 mg | ORAL_TABLET | Freq: Once | ORAL | Status: AC
  Administered 2023-01-02: 650 mg via ORAL
  Filled 2023-01-02: qty 2

## 2023-01-02 MED ORDER — DARATUMUMAB-HYALURONIDASE-FIHJ 1800-30000 MG-UT/15ML ~~LOC~~ SOLN
1800.0000 mg | Freq: Once | SUBCUTANEOUS | Status: AC
  Administered 2023-01-02: 1800 mg via SUBCUTANEOUS
  Filled 2023-01-02: qty 15

## 2023-01-02 NOTE — Patient Instructions (Signed)
[name]  Discharge Instructions: Thank you for choosing [facility] to provide your oncology and hematology care.   If you have a lab appointment with the [facility], please go directly to the [facility] and check in at the registration area.   Wear comfortable clothing and clothing appropriate for easy access to any Portacath or PICC line.   We strive to give you quality time with your provider. You may need to reschedule your appointment if you arrive late (15 or more minutes).  Arriving late affects you and other patients whose appointments are after yours.  Also, if you miss three or more appointments without notifying the office, you may be dismissed from the clinic at the provider's discretion.      For prescription refill requests, have your pharmacy contact our office and allow 72 hours for refills to be completed.    Today you received the following chemotherapy and/or immunotherapy agents: Darzalex Faspro      To help prevent nausea and vomiting after your treatment, we encourage you to take your nausea medication as directed.  BELOW ARE SYMPTOMS THAT SHOULD BE REPORTED IMMEDIATELY: *FEVER GREATER THAN 100.4 F (38 C) OR HIGHER *CHILLS OR SWEATING *NAUSEA AND VOMITING THAT IS NOT CONTROLLED WITH YOUR NAUSEA MEDICATION *UNUSUAL SHORTNESS OF BREATH *UNUSUAL BRUISING OR BLEEDING *URINARY PROBLEMS (pain or burning when urinating, or frequent urination) *BOWEL PROBLEMS (unusual diarrhea, constipation, pain near the anus) TENDERNESS IN MOUTH AND THROAT WITH OR WITHOUT PRESENCE OF ULCERS (sore throat, sores in mouth, or a toothache) UNUSUAL RASH, SWELLING OR PAIN  UNUSUAL VAGINAL DISCHARGE OR ITCHING   Items with * indicate a potential emergency and should be followed up as soon as possible or go to the Emergency Department if any problems should occur.  Please show the CHEMOTHERAPY ALERT CARD or IMMUNOTHERAPY ALERT CARD at  check-in to the Emergency Department and triage nurse.  Should you have questions after your visit or need to cancel or reschedule your appointment, please contact  [facility]  Dept: [phone]  and follow the prompts.  Office hours are 8:00 a.m. to 4:30 p.m. Monday - Friday. Please note that voicemails left after 4:00 p.m. may not be returned until the following business day.  We are closed weekends and major holidays. You have access to a nurse at all times for urgent questions. Please call the main number to the clinic Dept: [phone] and follow the prompts.   For any non-urgent questions, you may also contact your provider using MyChart. We now offer e-Visits for anyone 18 and older to request care online for non-urgent symptoms. For details visit [email or web address].   Also download the MyChart app! Go to the app store, search "MyChart", open the app, select , and log in with your MyChart username and password.   

## 2023-01-02 NOTE — Progress Notes (Signed)
Verbal from Dr. Burr Medico, "OK To wait 2mns after PO Dex to administer Daratumumab".

## 2023-01-02 NOTE — Telephone Encounter (Signed)
CRITICAL VALUE STICKER  CRITICAL VALUE: Creatinine 3.76  RECEIVER (on-site recipient of call): Maurine Simmering CMA  DATE & TIME NOTIFIED: 01/02/23 1301  MESSENGER (representative from lab): Verdis Frederickson  MD NOTIFIED: Dr. Burr Medico  TIME OF NOTIFICATION: 1301  RESPONSE: Notified Dr. Burr Medico

## 2023-01-02 NOTE — Progress Notes (Deleted)
[facility]  [phone] [phone]   Name: ANTARIUS EKLOF Date: 01/02/2023 MRN: [id number]  DOB: 08/15/1951  Patient Care Team: Pcp, No as PCP - General [place], Lavonda Jumbo, RN (Inactive) as Registered Nurse Stafford Courthouse, Carlena Sax, NP as Nurse Practitioner (Nurse Practitioner) Truitt Merle, MD as Attending Physician (Hematology and Oncology)    INTERVAL HISTORY: [facility] is a 72 y.o. 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  Recently admitted and discharged on 01/20/22 after receiving treatment for left MCA CVA. Palliative following for ongoing goals of care discussions and symptom management.   SOCIAL HISTORY:     reports that he has been smoking cigarettes. He has a 6.25 pack-year smoking history. He has never used smokeless tobacco.  ADVANCE DIRECTIVES:  Patient reports completed document.  Son Dennie, [place]. is his healthcare power of attorney.  CODE STATUS: Full code  PAST MEDICAL HISTORY: Past Medical History:  Diagnosis Date   Cancer ([place])     ALLERGIES:  has No Known Allergies.  MEDICATIONS:  Current Outpatient Medications  Medication Sig Dispense Refill   acetaminophen (TYLENOL) 500 MG tablet Take 1,000 mg by mouth every 6 (six) hours as needed for moderate pain.     ALPRAZolam (XANAX) 1 MG tablet Take 1 tablet (1 mg total) by mouth every 8 (eight) hours as needed for anxiety or sleep. 90 tablet 0   aspirin 81 MG EC tablet Take 1 tablet (81 mg total) by mouth daily. Swallow whole. 30 tablet 11   cetirizine (ZYRTEC) 10 MG tablet Take 10 mg by mouth daily.     diclofenac Sodium (VOLTAREN) 1 % GEL Apply 1 Application topically 4 (four) times daily as needed (pain).     ezetimibe (ZETIA) 10 MG tablet Take 1 tablet (10 mg total) by mouth daily. 90 tablet 3   folic acid (FOLVITE)  1 MG tablet TAKE 2 TABLETS BY MOUTH EVERY DAY 60 tablet 0   Melatonin 10 MG TABS Take 10 mg by mouth at bedtime.     metoprolol succinate (TOPROL-XL) 25 MG 24 hr tablet TAKE 1/2 TABLET BY MOUTH EVERY DAY 15 tablet 4   midodrine (PROAMATINE) 5 MG tablet TAKE 1 TABLET BY MOUTH 3 TIMES DAILY WITH MEALS 270 tablet 3   mirtazapine (REMERON) 30 MG tablet TAKE 1 TABLET BY MOUTH AT BEDTIME 30 tablet 1   ondansetron (ZOFRAN) 4 MG tablet TAKE 1 TABLET BY MOUTH EVERY 8 HOURS AS NEEDED FOR NAUSEA AND VOMITING 45 tablet 2   oxyCODONE (OXYCONTIN) 15 mg 12 hr tablet Take 1 tablet (15 mg total) by mouth every 12 (twelve) hours. 60 tablet 0   oxyCODONE (ROXICODONE) 15 MG immediate release tablet Take 1 tablet (15 mg total) by mouth every 6 (six) hours as needed. 60 tablet 0   pantoprazole (PROTONIX) 40 MG tablet TAKE 1 TABLET BY MOUTH 2 TIMES DAILY BEFORE A MEAL 60 tablet 0   prochlorperazine (COMPAZINE) 5 MG tablet Take 1 tablet (5 mg total) by mouth every 6 (six) hours as needed for nausea or vomiting. (Patient taking differently: Take 5 mg by mouth every 4 (four) hours as needed for nausea or vomiting.) 30 tablet 2   SENEXON-S 8.6-50 MG tablet TAKE 1 TABLET BY MOUTH EVERY DAY 30 tablet 6   tamsulosin (FLOMAX) 0.4 MG CAPS capsule TAKE 1 CAPSULE BY MOUTH EVERY DAY  30 capsule 0   zolpidem (AMBIEN) 5 MG tablet Take 1 tablet (5 mg total) by mouth at bedtime as needed. for sleep 30 tablet 1   No current facility-administered medications for this visit.   Facility-Administered Medications Ordered in Other Visits  Medication Dose Route Frequency Provider Last Rate Last Admin   heparin lock flush 100 unit/mL  500 Units Intracatheter Once Truitt Merle, MD       sodium chloride flush (NS) 0.9 % injection 10 mL  10 mL Intracatheter Once Truitt Merle, MD        VITAL SIGNS: There were no vitals taken for this visit. There were no vitals filed for this visit.    Estimated body mass index is 20.09 kg/m as calculated from  the following:   Height as of 12/19/22: 5' 10"$  (1.778 m).   Weight as of 12/19/22: 140 lb (63.5 kg).   PERFORMANCE STATUS (ECOG) : 2 - Symptomatic, <50% confined to bed   IMPRESSION:    Pain Anquan continues to endorse generalized pain. States some days are better than others however on his worst days pain sometimes keep him from being active. We discussed his current regimen. He is tolerating xtampza 18m however is not able to identify much of a difference. Oxy IR as needed. Education provided regarding medication adjustment. Patient and family verbalized understanding.    Education provided on continued use of breakthrough medication. Also discussed use of oxycodone ER. Education provided on possible side effects.   We will continue to closely monitor.    Decreased Appetite   Constipation   4.  Anxiety .     PLAN: Oxy IR 5-10 mg every 4-6 hours as needed for breakthrough pain Oxycontin 15 mg every 12 hours.  Extensive education provided on starting medication and potential side effects. Will closely monitor appetite and make adjustments if no increase or further weight decline. Education provided on small frequent meals. Followed by Dietician. Weight increased.  Xanax 1 mg every 8 hours as needed for anxiety/sleep Miralax twice daily Senna-S daily Zofran as needed for nausea.  I will plan to see him back in 3-4 weeks in collaboration with his other oncology appointments.   Patient expressed understanding and was in agreement with this plan. He also understands that He can call the clinic at any time with any questions, concerns, or complaints.      Any controlled substances utilized were prescribed in the context of palliative care. PDMP has been reviewed.    Time Total: 40 min   Visit consisted of counseling and education dealing with the complex and emotionally intense issues of symptom management and palliative care in the setting of serious and potentially  life-threatening illness.Greater than 50%  of this time was spent counseling and coordinating care related to the above assessment and plan.  NAlda Lea AGPCNP-BC  Palliative Medicine Team/[facility]

## 2023-01-02 NOTE — Progress Notes (Signed)
Pt informed this RN that he did not take at home PO dexamethasone prior to his appointments today. MD made aware. Dexamethasone to be given as a premedication in infusion prior to Darzalex Faspro today.  Per MD OK to only wait 30 minutes after dexamethasone today prior to administering Darzalex Faspro.

## 2023-01-02 NOTE — Progress Notes (Signed)
[facility]  Telephone:(336) [phone] Fax:(336) [phone]   Name: Caleb Simmons Date: 01/02/2023 MRN: [id number]  DOB: [birth year]  Patient Care Team: Truitt Merle, MD as PCP - General (Hematology) Buntin, Lavonda Jumbo, RN (Inactive) as Registered Nurse Pickenpack-Cousar, Carlena Sax, NP as Nurse Practitioner (Nurse Practitioner) Truitt Merle, MD as Attending Physician (Hematology and Oncology)    INTERVAL HISTORY: Caleb Simmons is a 72 y.o. 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  Recently admitted and discharged on 01/20/22 after receiving treatment for left MCA CVA. Palliative following for ongoing goals of care discussions and symptom management.   SOCIAL HISTORY:     reports that he has been smoking cigarettes. He has a 6.25 pack-year smoking history. He has never used smokeless tobacco.  ADVANCE DIRECTIVES:  Patient reports completed document.  Son Caleb, Simmons. is his healthcare power of attorney.  CODE STATUS: Full code  PAST MEDICAL HISTORY: Past Medical History:  Diagnosis Date   Cancer ([place])     ALLERGIES:  has No Known Allergies.  MEDICATIONS:  Current Outpatient Medications  Medication Sig Dispense Refill   acetaminophen (TYLENOL) 500 MG tablet Take 1,000 mg by mouth every 6 (six) hours as needed for moderate pain.     ALPRAZolam (XANAX) 1 MG tablet Take 1 tablet (1 mg total) by mouth every 8 (eight) hours as needed for anxiety or sleep. 90 tablet 0   aspirin 81 MG EC tablet Take 1 tablet (81 mg total) by mouth daily. Swallow whole. 30 tablet 11   cetirizine (ZYRTEC) 10 MG tablet Take 10 mg by mouth daily.     diclofenac Sodium (VOLTAREN) 1 % GEL Apply 1 Application topically 4 (four) times daily as needed (pain).     ezetimibe (ZETIA) 10 MG tablet Take 1 tablet (10 mg total) by mouth daily. 90 tablet 3    folic acid (FOLVITE) 1 MG tablet TAKE 2 TABLETS BY MOUTH EVERY DAY 60 tablet 0   Melatonin 10 MG TABS Take 10 mg by mouth at bedtime.     metoprolol succinate (TOPROL-XL) 25 MG 24 hr tablet TAKE 1/2 TABLET BY MOUTH EVERY DAY 15 tablet 4   midodrine (PROAMATINE) 5 MG tablet TAKE 1 TABLET BY MOUTH 3 TIMES DAILY WITH MEALS 270 tablet 3   mirtazapine (REMERON) 30 MG tablet TAKE 1 TABLET BY MOUTH AT BEDTIME 30 tablet 1   ondansetron (ZOFRAN) 4 MG tablet TAKE 1 TABLET BY MOUTH EVERY 8 HOURS AS NEEDED FOR NAUSEA AND VOMITING 45 tablet 2   oxyCODONE (OXYCONTIN) 15 mg 12 hr tablet Take 1 tablet (15 mg total) by mouth every 12 (twelve) hours. 60 tablet 0   oxyCODONE (ROXICODONE) 15 MG immediate release tablet Take 1 tablet (15 mg total) by mouth every 6 (six) hours as needed. 60 tablet 0   pantoprazole (PROTONIX) 40 MG tablet TAKE 1 TABLET BY MOUTH 2 TIMES DAILY BEFORE A MEAL 60 tablet 0   prochlorperazine (COMPAZINE) 5 MG tablet Take 1 tablet (5 mg total) by mouth every 6 (six) hours as needed for nausea or vomiting. (Patient taking differently: Take 5 mg by mouth every 4 (four) hours as needed for nausea or vomiting.) 30 tablet 2   SENEXON-S 8.6-50 MG tablet TAKE 1 TABLET BY MOUTH EVERY DAY 30 tablet 6   tamsulosin (FLOMAX) 0.4 MG CAPS capsule TAKE 1 CAPSULE BY MOUTH  EVERY DAY 30 capsule 0   zolpidem (AMBIEN) 5 MG tablet Take 1 tablet (5 mg total) by mouth at bedtime as needed. for sleep 30 tablet 1   No current facility-administered medications for this visit.   Facility-Administered Medications Ordered in Other Visits  Medication Dose Route Frequency Provider Last Rate Last Admin   acetaminophen (TYLENOL) tablet 650 mg  650 mg Oral Once Truitt Merle, MD       daratumumab-hyaluronidase-fihj [facility] FASPRO) 1800-30000 MG-UT/15ML chemo SQ injection 1,800 mg  1,800 mg Subcutaneous Once Truitt Merle, MD       diphenhydrAMINE (BENADRYL) capsule 50 mg  50 mg Oral Once Truitt Merle, MD       heparin lock flush 100  unit/mL  500 Units Intracatheter Once Truitt Merle, MD       sodium chloride flush (NS) 0.9 % injection 10 mL  10 mL Intracatheter Once Truitt Merle, MD        VITAL SIGNS: There were no vitals taken for this visit. There were no vitals filed for this visit.    Estimated body mass index is 19.41 kg/m as calculated from the following:   Height as of 12/19/22: '5\' 10"'$  (1.778 m).   Weight as of an earlier encounter on 01/02/23: 135 lb 4.8 oz (61.4 kg).   PERFORMANCE STATUS (ECOG) : 2 - Symptomatic, <50% confined to bed  Assessment NAD, resting in bed RRR Normal breathing pattern AAO x3  IMPRESSION: I saw [name] during his infusion today.  [name] is also present.  No acute distress noted he continues to recover from recent COVID infection.  States his biggest concern now is his decrease in appetite.  He is slowly regaining some of his energy.    Pain [name] continues to endorse generalized pain. States some days are better than others however on his worst days pain sometimes keep him from being active. We discussed his current regimen.  We initially started patient on Xtampza to allow for more long-term pain control however he chose to discontinue use as he did not like how it made him feel.  We will continue with oxycodone as needed.  Patient is tolerating without difficulty.  States he feels his pain is controlled.  Patient and family verbalized understanding.    We will continue to closely monitor.    Decreased Appetite [name] shares patient has had little to no appetite. [name] states foods do not taste the same and endorses satiety. We discussed focusing on small frequent meals vs large meals. We discussed ways to increase protein also with precautions due to renal failure. Patient reports after his treatments his appetite is good for at least 2-3 days. Education provided on dexamethasone. We will do a trial to stimulate appetite.   His current weight is 135lbs down from 140 lbs on  12/28, 142 lbs on 11/16.   Constipation Well controlled with daily softeners.   4.  Anxiety Controlled with medications. Is able to sleep better at night.    PLAN: Oxy IR 5-10 mg every 4-6 hours as needed for breakthrough pain Will closely monitor appetite and make adjustments if no increase or further weight decline. Education provided on small frequent meals. Followed by Dietician. Weight increased.  Xanax 1 mg every 8 hours as needed for anxiety/sleep Miralax twice daily Senna-S daily Zofran as needed for nausea.  I will plan to see him back in 3-4 weeks in collaboration with his other oncology appointments.   Patient expressed understanding and was  in agreement with this plan. He also understands that He can call the clinic at any time with any questions, concerns, or complaints.     Any controlled substances utilized were prescribed in the context of palliative care. PDMP has been reviewed.   Time Total: 45 min   Visit consisted of counseling and education dealing with the complex and emotionally intense issues of symptom management and palliative care in the setting of serious and potentially life-threatening illness.Greater than 50%  of this time was spent counseling and coordinating care related to the above assessment and plan.  Alda Lea, AGPCNP-BC  Palliative Medicine Team/[facility]

## 2023-01-02 NOTE — Progress Notes (Signed)
Nutrition Follow-up:  Patient with AL amyloidosis/plasma cell myeloma. He is receiving daratumumab q28d.   12/28 - pt seen in ED for SOB, positive for COVID   ESRD on HD ([facility])  Follow-up completed via telephone due to Covid precautions. Patient reports breathing has improved and is back to regular HD schedule. His appetite is about the same, eating small portions a few times/day. He is unsure if he is taking appetite stimulant (remeron). Patient has not had a chance to try the CIB powder samples provided at previous visit. He reports drinking [employer] chocolate milk every other day. Patient has not eaten today. Politely declined RD offer to bring sandwich/snacks to exam room where he is currently isolating until infusion bed is available. Patient denies nausea, vomiting, diarrhea, constipation.    Medications: xanax, folic acid, remeron, roxicodone, flowmax  Labs: 12/28 - Hgb 10.0  Anthropometrics: Patient stable 68-69 kg at dialysis  (149.6 - 151.8 lb)  12/28 - 140 lb (? Accuracy - wt obtained in ED) 12/14 - 150 lb  11/16 - 142 lb 12 oz   NUTRITION DIAGNOSIS: Inadequate oral intake continues    INTERVENTION:  Continue to encourage smaller more frequent meals/snacks with adequate calories and protein Encouraged pt to try CIB samples, recommend drinking CIB or nestle's protein drink 2-3x/day Provided direct office number for pt to call should he want a sandwich/snack while waiting for treatment     MONITORING, EVALUATION, GOAL: weight trends, intake   NEXT VISIT:  Thursday March 7 in infusion with Pamala Hurry

## 2023-01-03 ENCOUNTER — Encounter: Payer: Self-pay | Admitting: Hematology

## 2023-01-08 ENCOUNTER — Other Ambulatory Visit: Payer: Self-pay | Admitting: Nurse Practitioner

## 2023-01-08 DIAGNOSIS — G4700 Insomnia, unspecified: Secondary | ICD-10-CM

## 2023-01-08 DIAGNOSIS — E854 Organ-limited amyloidosis: Secondary | ICD-10-CM

## 2023-01-08 DIAGNOSIS — Z515 Encounter for palliative care: Secondary | ICD-10-CM

## 2023-01-09 ENCOUNTER — Other Ambulatory Visit: Payer: Self-pay | Admitting: Hematology

## 2023-01-09 ENCOUNTER — Inpatient Hospital Stay: Payer: Commercial Managed Care - PPO

## 2023-01-09 ENCOUNTER — Other Ambulatory Visit: Payer: Self-pay | Admitting: Nurse Practitioner

## 2023-01-16 ENCOUNTER — Inpatient Hospital Stay: Payer: Commercial Managed Care - PPO

## 2023-01-20 ENCOUNTER — Other Ambulatory Visit: Payer: Self-pay

## 2023-01-20 DIAGNOSIS — E854 Organ-limited amyloidosis: Secondary | ICD-10-CM

## 2023-01-20 DIAGNOSIS — G893 Neoplasm related pain (acute) (chronic): Secondary | ICD-10-CM

## 2023-01-20 DIAGNOSIS — E8581 Light chain (AL) amyloidosis: Secondary | ICD-10-CM

## 2023-01-20 DIAGNOSIS — Z515 Encounter for palliative care: Secondary | ICD-10-CM

## 2023-01-20 DIAGNOSIS — R110 Nausea: Secondary | ICD-10-CM

## 2023-01-20 MED ORDER — PROCHLORPERAZINE MALEATE 5 MG PO TABS: 5.0000 mg | ORAL_TABLET | Freq: Four times a day (QID) | ORAL | 2 refills | Status: DC | PRN

## 2023-01-20 MED ORDER — ONDANSETRON HCL 4 MG PO TABS: ORAL_TABLET | ORAL | 2 refills | Status: DC

## 2023-01-20 MED ORDER — OXYCODONE HCL 15 MG PO TABS: 15.0000 mg | ORAL_TABLET | Freq: Four times a day (QID) | ORAL | 0 refills | Status: DC | PRN

## 2023-01-20 NOTE — Telephone Encounter (Signed)
Pt SO nancy called for refill, see new orders.

## 2023-01-30 ENCOUNTER — Other Ambulatory Visit: Payer: Self-pay

## 2023-01-30 ENCOUNTER — Inpatient Hospital Stay: Payer: Commercial Managed Care - PPO

## 2023-01-30 ENCOUNTER — Telehealth: Payer: Self-pay

## 2023-01-30 ENCOUNTER — Inpatient Hospital Stay (HOSPITAL_BASED_OUTPATIENT_CLINIC_OR_DEPARTMENT_OTHER): Payer: Commercial Managed Care - PPO | Admitting: Nurse Practitioner

## 2023-01-30 ENCOUNTER — Inpatient Hospital Stay: Payer: Commercial Managed Care - PPO | Attending: Hematology

## 2023-01-30 ENCOUNTER — Encounter: Payer: Self-pay | Admitting: Nurse Practitioner

## 2023-01-30 VITALS — BP 88/58 | HR 58 | Temp 98.8°F | Resp 18 | Wt 132.4 lb

## 2023-01-30 VITALS — BP 114/91 | HR 75

## 2023-01-30 DIAGNOSIS — E8581 Light chain (AL) amyloidosis: Secondary | ICD-10-CM | POA: Insufficient documentation

## 2023-01-30 DIAGNOSIS — C9000 Multiple myeloma not having achieved remission: Secondary | ICD-10-CM | POA: Insufficient documentation

## 2023-01-30 DIAGNOSIS — E854 Organ-limited amyloidosis: Secondary | ICD-10-CM

## 2023-01-30 DIAGNOSIS — Z515 Encounter for palliative care: Secondary | ICD-10-CM | POA: Insufficient documentation

## 2023-01-30 DIAGNOSIS — F419 Anxiety disorder, unspecified: Secondary | ICD-10-CM | POA: Diagnosis not present

## 2023-01-30 DIAGNOSIS — R530 Neoplastic (malignant) related fatigue: Secondary | ICD-10-CM | POA: Diagnosis not present

## 2023-01-30 DIAGNOSIS — Z5112 Encounter for antineoplastic immunotherapy: Secondary | ICD-10-CM | POA: Diagnosis present

## 2023-01-30 DIAGNOSIS — K5900 Constipation, unspecified: Secondary | ICD-10-CM | POA: Insufficient documentation

## 2023-01-30 DIAGNOSIS — R634 Abnormal weight loss: Secondary | ICD-10-CM

## 2023-01-30 DIAGNOSIS — R630 Anorexia: Secondary | ICD-10-CM

## 2023-01-30 DIAGNOSIS — G893 Neoplasm related pain (acute) (chronic): Secondary | ICD-10-CM

## 2023-01-30 DIAGNOSIS — R52 Pain, unspecified: Secondary | ICD-10-CM | POA: Diagnosis not present

## 2023-01-30 DIAGNOSIS — I43 Cardiomyopathy in diseases classified elsewhere: Secondary | ICD-10-CM

## 2023-01-30 LAB — CBC WITH DIFFERENTIAL/PLATELET
Abs Immature Granulocytes: 0.05 10*3/uL (ref 0.00–0.07)
Basophils Absolute: 0.1 10*3/uL (ref 0.0–0.1)
Basophils Relative: 1 %
Eosinophils Absolute: 0.1 10*3/uL (ref 0.0–0.5)
Eosinophils Relative: 1 %
HCT: 27.8 % — ABNORMAL LOW (ref 39.0–52.0)
Hemoglobin: 9 g/dL — ABNORMAL LOW (ref 13.0–17.0)
Immature Granulocytes: 1 %
Lymphocytes Relative: 10 %
Lymphs Abs: 1 10*3/uL (ref 0.7–4.0)
MCH: 29 pg (ref 26.0–34.0)
MCHC: 32.4 g/dL (ref 30.0–36.0)
MCV: 89.7 fL (ref 80.0–100.0)
Monocytes Absolute: 0.6 10*3/uL (ref 0.1–1.0)
Monocytes Relative: 5 %
Neutro Abs: 8.7 10*3/uL — ABNORMAL HIGH (ref 1.7–7.7)
Neutrophils Relative %: 82 %
Platelets: 132 10*3/uL — ABNORMAL LOW (ref 150–400)
RBC: 3.1 MIL/uL — ABNORMAL LOW (ref 4.22–5.81)
RDW: 18.6 % — ABNORMAL HIGH (ref 11.5–15.5)
WBC: 10.5 10*3/uL (ref 4.0–10.5)
nRBC: 0 % (ref 0.0–0.2)

## 2023-01-30 LAB — COMPREHENSIVE METABOLIC PANEL
ALT: 5 U/L (ref 0–44)
AST: 9 U/L — ABNORMAL LOW (ref 15–41)
Albumin: 3 g/dL — ABNORMAL LOW (ref 3.5–5.0)
Alkaline Phosphatase: 92 U/L (ref 38–126)
Anion gap: 11 (ref 5–15)
BUN: 34 mg/dL — ABNORMAL HIGH (ref 8–23)
CO2: 24 mmol/L (ref 22–32)
Calcium: 8.2 mg/dL — ABNORMAL LOW (ref 8.9–10.3)
Chloride: 100 mmol/L (ref 98–111)
Creatinine, Ser: 3.89 mg/dL (ref 0.61–1.24)
GFR, Estimated: 16 mL/min — ABNORMAL LOW (ref >60)
Glucose, Bld: 116 mg/dL — ABNORMAL HIGH (ref 70–99)
Potassium: 3.9 mmol/L (ref 3.5–5.1)
Sodium: 135 mmol/L (ref 135–145)
Total Bilirubin: 0.5 mg/dL (ref 0.3–1.2)
Total Protein: 5.6 g/dL — ABNORMAL LOW (ref 6.5–8.1)

## 2023-01-30 MED ORDER — DARATUMUMAB-HYALURONIDASE-FIHJ 1800-30000 MG-UT/15ML ~~LOC~~ SOLN
1800.0000 mg | Freq: Once | SUBCUTANEOUS | Status: AC
  Administered 2023-01-30: 1800 mg via SUBCUTANEOUS
  Filled 2023-01-30: qty 15

## 2023-01-30 MED ORDER — ACETAMINOPHEN 325 MG PO TABS
650.0000 mg | ORAL_TABLET | Freq: Once | ORAL | Status: AC
  Administered 2023-01-30: 650 mg via ORAL
  Filled 2023-01-30: qty 2

## 2023-01-30 MED ORDER — DIPHENHYDRAMINE HCL 25 MG PO CAPS
50.0000 mg | ORAL_CAPSULE | Freq: Once | ORAL | Status: AC
  Administered 2023-01-30: 50 mg via ORAL
  Filled 2023-01-30: qty 2

## 2023-01-30 MED ORDER — ALPRAZOLAM 1 MG PO TABS: 1.0000 mg | ORAL_TABLET | Freq: Three times a day (TID) | ORAL | 0 refills | Status: DC | PRN

## 2023-01-30 MED ORDER — DEXAMETHASONE 4 MG PO TABS
20.0000 mg | ORAL_TABLET | Freq: Once | ORAL | Status: AC
  Administered 2023-01-30: 20 mg via ORAL
  Filled 2023-01-30: qty 5

## 2023-01-30 NOTE — Telephone Encounter (Signed)
Critical Lab Value reported: Scr+ 3.89  Notified Dr. Burr Medico of critical Lab.  Dialysis pt.

## 2023-01-30 NOTE — Patient Instructions (Signed)
[facility]   Discharge Instructions: Thank you for choosing [facility] to provide your oncology and hematology care.   If you have a lab appointment with the Cancer Center, please go directly to the Cancer Center and check in at the registration area.   Wear comfortable clothing and clothing appropriate for easy access to any Portacath or PICC line.   We strive to give you quality time with your provider. You may need to reschedule your appointment if you arrive late (15 or more minutes).  Arriving late affects you and other patients whose appointments are after yours.  Also, if you miss three or more appointments without notifying the office, you may be dismissed from the clinic at the provider's discretion.      For prescription refill requests, have your pharmacy contact our office and allow 72 hours for refills to be completed.    Today you received the following chemotherapy and/or immunotherapy agents: Daratumumab hyaluronidase (Darzalex faspro)      To help prevent nausea and vomiting after your treatment, we encourage you to take your nausea medication as directed.  BELOW ARE SYMPTOMS THAT SHOULD BE REPORTED IMMEDIATELY: *FEVER GREATER THAN 100.4 F (38 C) OR HIGHER *CHILLS OR SWEATING *NAUSEA AND VOMITING THAT IS NOT CONTROLLED WITH YOUR NAUSEA MEDICATION *UNUSUAL SHORTNESS OF BREATH *UNUSUAL BRUISING OR BLEEDING *URINARY PROBLEMS (pain or burning when urinating, or frequent urination) *BOWEL PROBLEMS (unusual diarrhea, constipation, pain near the anus) TENDERNESS IN MOUTH AND THROAT WITH OR WITHOUT PRESENCE OF ULCERS (sore throat, sores in mouth, or a toothache) UNUSUAL RASH, SWELLING OR PAIN  UNUSUAL VAGINAL DISCHARGE OR ITCHING   Items with * indicate a potential emergency and should be followed up as soon as possible or go to the Emergency Department if any problems should occur.  Please show the CHEMOTHERAPY ALERT CARD  or IMMUNOTHERAPY ALERT CARD at check-in to the Emergency Department and triage nurse.  Should you have questions after your visit or need to cancel or reschedule your appointment, please contact [facility]  Dept: [phone]  and follow the prompts.  Office hours are 8:00 a.m. to 4:30 p.m. Monday - Friday. Please note that voicemails left after 4:00 p.m. may not be returned until the following business day.  We are closed weekends and major holidays. You have access to a nurse at all times for urgent questions. Please call the main number to the clinic Dept: [phone] and follow the prompts.   For any non-urgent questions, you may also contact your provider using MyChart. We now offer e-Visits for anyone 18 and older to request care online for non-urgent symptoms. For details visit [email or web address].   Also download the MyChart app! Go to the app store, search "MyChart", open the app, select [facility], and log in with your MyChart username and password.   

## 2023-01-30 NOTE — Progress Notes (Signed)
[facility]  Telephone:(336) [phone] Fax:(336) [phone]   Name: [name] Date: 01/30/2023 MRN: [id number]  DOB: 04/06/1951  Patient Care Team: Truitt Merle, MD as PCP - General (Hematology) Buntin, Lavonda Jumbo, RN (Inactive) as Registered Nurse Pickenpack-Cousar, Carlena Sax, NP as Nurse Practitioner (Nurse Practitioner) Truitt Merle, MD as Attending Physician (Hematology and Oncology)    INTERVAL HISTORY: LOUIS Simmons is a 72 y.o. 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  Recently admitted and discharged on 01/20/22 after receiving treatment for left MCA CVA. Palliative following for ongoing goals of care discussions and symptom management.   SOCIAL HISTORY:     reports that he has been smoking cigarettes. He has a 6.25 pack-year smoking history. He has never used smokeless tobacco.  ADVANCE DIRECTIVES:  Patient reports completed document.  Son [name], [name]. is his healthcare power of attorney.  CODE STATUS: Full code  PAST MEDICAL HISTORY: Past Medical History:  Diagnosis Date   Cancer ([place])     ALLERGIES:  has No Known Allergies.  MEDICATIONS:  Current Outpatient Medications  Medication Sig Dispense Refill   acetaminophen (TYLENOL) 500 MG tablet Take 1,000 mg by mouth every 6 (six) hours as needed for moderate pain.     ALPRAZolam (XANAX) 1 MG tablet Take 1 tablet (1 mg total) by mouth every 8 (eight) hours as needed for anxiety or sleep. 90 tablet 0   aspirin 81 MG EC tablet Take 1 tablet (81 mg total) by mouth daily. Swallow whole. 30 tablet 11   cetirizine (ZYRTEC) 10 MG tablet Take 10 mg by mouth daily.     dexamethasone (DECADRON) 2 MG tablet Take 1 tablet (2 mg total) by mouth daily. 14 tablet 0   diclofenac Sodium (VOLTAREN) 1 % GEL Apply 1 Application topically 4 (four) times daily as needed (pain).      ezetimibe (ZETIA) 10 MG tablet Take 1 tablet (10 mg total) by mouth daily. 90 tablet 3   folic acid (FOLVITE) 1 MG tablet TAKE 2 TABLETS BY MOUTH EVERY DAY 60 tablet 0   Melatonin 10 MG TABS Take 10 mg by mouth at bedtime.     metoprolol succinate (TOPROL-XL) 25 MG 24 hr tablet TAKE 1/2 TABLET BY MOUTH EVERY DAY 15 tablet 4   midodrine (PROAMATINE) 5 MG tablet TAKE 1 TABLET BY MOUTH 3 TIMES DAILY WITH MEALS 270 tablet 3   mirtazapine (REMERON) 30 MG tablet TAKE 1 TABLET BY MOUTH AT BEDTIME 30 tablet 1   ondansetron (ZOFRAN) 4 MG tablet TAKE 1 TABLET BY MOUTH EVERY 8 HOURS AS NEEDED FOR NAUSEA AND VOMITING 30 tablet 2   oxyCODONE (ROXICODONE) 15 MG immediate release tablet Take 1 tablet (15 mg total) by mouth every 6 (six) hours as needed. 60 tablet 0   pantoprazole (PROTONIX) 40 MG tablet TAKE 1 TABLET BY MOUTH 2 TIMES DAILY BEFORE A MEAL 60 tablet 0   prochlorperazine (COMPAZINE) 5 MG tablet Take 1 tablet (5 mg total) by mouth every 6 (six) hours as needed for nausea or vomiting. 30 tablet 2   SENEXON-S 8.6-50 MG tablet TAKE 1 TABLET BY MOUTH EVERY DAY 30 tablet 6   tamsulosin (FLOMAX) 0.4 MG CAPS capsule TAKE 1 CAPSULE BY MOUTH EVERY DAY 30 capsule 0   zolpidem (AMBIEN) 5 MG tablet TAKE 1 TABLET BY MOUTH AT BEDTIME AS NEEDED FOR SLEEP  30 tablet 1   No current facility-administered medications for this visit.   Facility-Administered Medications Ordered in Other Visits  Medication Dose Route Frequency Provider Last Rate Last Admin   heparin lock flush 100 unit/mL  500 Units Intracatheter Once Truitt Merle, MD       sodium chloride flush (NS) 0.9 % injection 10 mL  10 mL Intracatheter Once Truitt Merle, MD        VITAL SIGNS: There were no vitals taken for this visit. There were no vitals filed for this visit.    Estimated body mass index is 19.41 kg/m as calculated from the following:   Height as of 12/19/22: '5\' 10"'$  (1.778 m).   Weight as of 01/02/23: 135 lb 4.8 oz (61.4  kg).   PERFORMANCE STATUS (ECOG) : 2 - Symptomatic, <50% confined to bed  Assessment NAD, thin, in wheelchair  RRR Normal breathing pattern AAO x3  IMPRESSION:  [name] presents to clinic today for follow-up. Caleb Simmons is present. No acute distress. In wheelchair. Is tolerating his HD treatments twice weekly. Continues to recover from recent COVID illness. Shares this really knocked him down causing increase in his fatigue, weight loss, and change in taste.   Pain [place] continues to endorse generalized pain. States some days are better than others however on his worst days pain sometimes keep him from being active. We discussed his current regimen.  We initially started patient on Xtampza to allow for more long-term pain control however he chose to discontinue use as he did not like how it made him feel.  We will continue with oxycodone as needed.  Patient is tolerating without difficulty.  States he feels his pain is controlled.  Patient and family verbalized understanding.    We will continue to closely monitor.    Decreased Appetite  I expressed concerns with [name] ongoing weight loss. He expresses although is appetite has not been the best it has been worse since having COVID. He is still struggling to get his taste back. Foods do not taste good. Once he starts eating he loses all desire. Caleb Simmons shares she has been trying to make foods or purchase foods that are appealing to him. [name] states he loves pasta and can tolerate that best.   He is taking mirtazapine at bed. Does not seem to be as effective as it once was but would like to continue. We did attempt a steroid burst to assist with his appetite in January however he only took for 2 days due to his COVID infection, weakness, and diarrhea. He has a 5 day supply left. Advised to restart on Saturday until completed.   [name]'s weight today is 132lbs down from 135 pounds on 1/11, 142 pounds on 11/16.  He is actively being followed by  registered dietitian with upcoming appointment on 3/7.  We discussed at length focusing on small frequent meals and snacking when desired.  Encourage patient to consider protein shakes for additional support however he expresses his dislike with all attempts previously.  Constipation Controlled at this time.   PLAN: Oxy IR 5-10 mg every 4-6 hours as needed for breakthrough pain Will closely monitor appetite and make adjustments if no increase or further weight decline. Education provided on small frequent meals. Followed by Dietician.  Xanax 1 mg every 8 hours as needed for anxiety/sleep Miralax twice daily Senna-S daily Zofran as needed for nausea.  I will plan to see him back in 3-4 weeks in collaboration with his other  oncology appointments.   Patient expressed understanding and was in agreement with this plan. He also understands that He can call the clinic at any time with any questions, concerns, or complaints.       Any controlled substances utilized were prescribed in the context of palliative care. PDMP has been reviewed.    Time Total: 45 min   Visit consisted of counseling and education dealing with the complex and emotionally intense issues of symptom management and palliative care in the setting of serious and potentially life-threatening illness.Greater than 50%  of this time was spent counseling and coordinating care related to the above assessment and plan.  Alda Lea, AGPCNP-BC  Palliative Medicine Team/[facility]

## 2023-01-31 LAB — KAPPA/LAMBDA LIGHT CHAINS
Kappa free light chain: 48.8 mg/L — ABNORMAL HIGH (ref 3.3–19.4)
Kappa, lambda light chain ratio: 1.92 — ABNORMAL HIGH (ref 0.26–1.65)
Lambda free light chains: 25.4 mg/L (ref 5.7–26.3)

## 2023-02-04 ENCOUNTER — Other Ambulatory Visit: Payer: Self-pay

## 2023-02-04 DIAGNOSIS — G893 Neoplasm related pain (acute) (chronic): Secondary | ICD-10-CM

## 2023-02-04 DIAGNOSIS — G4700 Insomnia, unspecified: Secondary | ICD-10-CM

## 2023-02-04 DIAGNOSIS — Z515 Encounter for palliative care: Secondary | ICD-10-CM

## 2023-02-04 DIAGNOSIS — E854 Organ-limited amyloidosis: Secondary | ICD-10-CM

## 2023-02-04 MED ORDER — OXYCODONE HCL 15 MG PO TABS: 15.0000 mg | ORAL_TABLET | Freq: Four times a day (QID) | ORAL | 0 refills | Status: DC | PRN

## 2023-02-04 MED ORDER — DEXAMETHASONE 2 MG PO TABS: 2.0000 mg | ORAL_TABLET | Freq: Every day | ORAL | 0 refills | Status: DC

## 2023-02-04 MED ORDER — ZOLPIDEM TARTRATE 5 MG PO TABS: 5.0000 mg | ORAL_TABLET | Freq: Every evening | ORAL | 0 refills | Status: DC | PRN

## 2023-02-04 NOTE — Telephone Encounter (Signed)
Pt SO [name] called and reported pt doing much better and eating more. Also requesting a refill, see new orders.

## 2023-02-05 ENCOUNTER — Other Ambulatory Visit: Payer: Self-pay | Admitting: Hematology

## 2023-02-20 ENCOUNTER — Other Ambulatory Visit: Payer: Self-pay | Admitting: Nurse Practitioner

## 2023-02-20 DIAGNOSIS — E854 Organ-limited amyloidosis: Secondary | ICD-10-CM

## 2023-02-20 DIAGNOSIS — G893 Neoplasm related pain (acute) (chronic): Secondary | ICD-10-CM

## 2023-02-20 DIAGNOSIS — Z515 Encounter for palliative care: Secondary | ICD-10-CM

## 2023-02-20 MED ORDER — OXYCODONE HCL 15 MG PO TABS: 15.0000 mg | ORAL_TABLET | Freq: Four times a day (QID) | ORAL | 0 refills | Status: DC | PRN

## 2023-02-20 NOTE — Telephone Encounter (Signed)
From: [name] To: Jobe Gibbon, NP Sent: 02/20/2023 8:09 AM EST Subject: Medication Renewal Request  Refills have been requested for the following medications:   oxyCODONE (ROXICODONE) 15 MG immediate release tablet Chesley Noon Pickenpack-Cousar]  Preferred pharmacy: [facility], [address]  This message is being sent by Cassandria Anger on behalf of [name]

## 2023-02-21 ENCOUNTER — Encounter: Payer: Self-pay | Admitting: Hematology

## 2023-02-27 ENCOUNTER — Inpatient Hospital Stay: Payer: Commercial Managed Care - PPO | Attending: Hematology | Admitting: Hematology

## 2023-02-27 ENCOUNTER — Other Ambulatory Visit: Payer: Self-pay

## 2023-02-27 ENCOUNTER — Inpatient Hospital Stay: Payer: Commercial Managed Care - PPO

## 2023-02-27 ENCOUNTER — Inpatient Hospital Stay: Payer: Commercial Managed Care - PPO | Admitting: Nutrition

## 2023-02-27 ENCOUNTER — Encounter: Payer: Self-pay | Admitting: Hematology

## 2023-02-27 ENCOUNTER — Encounter: Payer: Self-pay | Admitting: Nurse Practitioner

## 2023-02-27 ENCOUNTER — Inpatient Hospital Stay (HOSPITAL_BASED_OUTPATIENT_CLINIC_OR_DEPARTMENT_OTHER): Payer: Commercial Managed Care - PPO | Admitting: Nurse Practitioner

## 2023-02-27 VITALS — BP 136/98 | HR 80 | Temp 98.4°F | Resp 17 | Ht 70.0 in | Wt 146.9 lb

## 2023-02-27 DIAGNOSIS — E8581 Light chain (AL) amyloidosis: Secondary | ICD-10-CM

## 2023-02-27 DIAGNOSIS — D649 Anemia, unspecified: Secondary | ICD-10-CM

## 2023-02-27 DIAGNOSIS — R52 Pain, unspecified: Secondary | ICD-10-CM | POA: Diagnosis not present

## 2023-02-27 DIAGNOSIS — D638 Anemia in other chronic diseases classified elsewhere: Secondary | ICD-10-CM

## 2023-02-27 DIAGNOSIS — R630 Anorexia: Secondary | ICD-10-CM

## 2023-02-27 DIAGNOSIS — Z8673 Personal history of transient ischemic attack (TIA), and cerebral infarction without residual deficits: Secondary | ICD-10-CM | POA: Insufficient documentation

## 2023-02-27 DIAGNOSIS — I951 Orthostatic hypotension: Secondary | ICD-10-CM | POA: Diagnosis not present

## 2023-02-27 DIAGNOSIS — N186 End stage renal disease: Secondary | ICD-10-CM | POA: Diagnosis not present

## 2023-02-27 DIAGNOSIS — R634 Abnormal weight loss: Secondary | ICD-10-CM

## 2023-02-27 DIAGNOSIS — Z992 Dependence on renal dialysis: Secondary | ICD-10-CM | POA: Diagnosis not present

## 2023-02-27 DIAGNOSIS — C9000 Multiple myeloma not having achieved remission: Secondary | ICD-10-CM | POA: Insufficient documentation

## 2023-02-27 DIAGNOSIS — G893 Neoplasm related pain (acute) (chronic): Secondary | ICD-10-CM | POA: Diagnosis not present

## 2023-02-27 DIAGNOSIS — Z515 Encounter for palliative care: Secondary | ICD-10-CM | POA: Diagnosis not present

## 2023-02-27 DIAGNOSIS — Z5112 Encounter for antineoplastic immunotherapy: Secondary | ICD-10-CM | POA: Insufficient documentation

## 2023-02-27 DIAGNOSIS — R530 Neoplastic (malignant) related fatigue: Secondary | ICD-10-CM

## 2023-02-27 LAB — CBC WITH DIFFERENTIAL/PLATELET
Abs Immature Granulocytes: 0.05 10*3/uL (ref 0.00–0.07)
Basophils Absolute: 0 10*3/uL (ref 0.0–0.1)
Basophils Relative: 1 %
Eosinophils Absolute: 0.1 10*3/uL (ref 0.0–0.5)
Eosinophils Relative: 1 %
HCT: 21.8 % — ABNORMAL LOW (ref 39.0–52.0)
Hemoglobin: 7.1 g/dL — ABNORMAL LOW (ref 13.0–17.0)
Immature Granulocytes: 1 %
Lymphocytes Relative: 14 %
Lymphs Abs: 1.2 10*3/uL (ref 0.7–4.0)
MCH: 31.4 pg (ref 26.0–34.0)
MCHC: 32.6 g/dL (ref 30.0–36.0)
MCV: 96.5 fL (ref 80.0–100.0)
Monocytes Absolute: 0.5 10*3/uL (ref 0.1–1.0)
Monocytes Relative: 6 %
Neutro Abs: 6.7 10*3/uL (ref 1.7–7.7)
Neutrophils Relative %: 77 %
Platelets: 159 10*3/uL (ref 150–400)
RBC: 2.26 MIL/uL — ABNORMAL LOW (ref 4.22–5.81)
RDW: 23.4 % — ABNORMAL HIGH (ref 11.5–15.5)
WBC: 8.6 10*3/uL (ref 4.0–10.5)
nRBC: 0 % (ref 0.0–0.2)

## 2023-02-27 LAB — COMPREHENSIVE METABOLIC PANEL
ALT: 9 U/L (ref 0–44)
AST: 9 U/L — ABNORMAL LOW (ref 15–41)
Albumin: 3.1 g/dL — ABNORMAL LOW (ref 3.5–5.0)
Alkaline Phosphatase: 97 U/L (ref 38–126)
Anion gap: 9 (ref 5–15)
BUN: 35 mg/dL — ABNORMAL HIGH (ref 8–23)
CO2: 25 mmol/L (ref 22–32)
Calcium: 7.2 mg/dL — ABNORMAL LOW (ref 8.9–10.3)
Chloride: 105 mmol/L (ref 98–111)
Creatinine, Ser: 3.67 mg/dL — ABNORMAL HIGH (ref 0.61–1.24)
GFR, Estimated: 17 mL/min — ABNORMAL LOW (ref >60)
Glucose, Bld: 131 mg/dL — ABNORMAL HIGH (ref 70–99)
Potassium: 4.4 mmol/L (ref 3.5–5.1)
Sodium: 139 mmol/L (ref 135–145)
Total Bilirubin: 0.4 mg/dL (ref 0.3–1.2)
Total Protein: 5.6 g/dL — ABNORMAL LOW (ref 6.5–8.1)

## 2023-02-27 LAB — PREPARE RBC (CROSSMATCH)

## 2023-02-27 MED ORDER — ACETAMINOPHEN 325 MG PO TABS
650.0000 mg | ORAL_TABLET | Freq: Once | ORAL | Status: AC
  Administered 2023-02-27: 650 mg via ORAL
  Filled 2023-02-27: qty 2

## 2023-02-27 MED ORDER — DEXAMETHASONE 4 MG PO TABS
20.0000 mg | ORAL_TABLET | Freq: Once | ORAL | Status: AC
  Administered 2023-02-27: 20 mg via ORAL
  Filled 2023-02-27: qty 5

## 2023-02-27 MED ORDER — DEXAMETHASONE 2 MG PO TABS: 2.0000 mg | ORAL_TABLET | Freq: Every day | ORAL | 0 refills | Status: DC

## 2023-02-27 MED ORDER — DIPHENHYDRAMINE HCL 25 MG PO CAPS
50.0000 mg | ORAL_CAPSULE | Freq: Once | ORAL | Status: AC
  Administered 2023-02-27: 50 mg via ORAL
  Filled 2023-02-27: qty 2

## 2023-02-27 MED ORDER — DARATUMUMAB-HYALURONIDASE-FIHJ 1800-30000 MG-UT/15ML ~~LOC~~ SOLN
1800.0000 mg | Freq: Once | SUBCUTANEOUS | Status: AC
  Administered 2023-02-27: 1800 mg via SUBCUTANEOUS
  Filled 2023-02-27: qty 15

## 2023-02-27 NOTE — Progress Notes (Signed)
[facility]   Telephone:(336) [phone] Fax:(336) [phone]   Clinic Follow up Note   Patient Care Team: Truitt Merle, MD as PCP - General (Hematology) Buntin, Lavonda Jumbo, RN (Inactive) as Registered Nurse Pickenpack-Cousar, Carlena Sax, NP as Nurse Practitioner (Nurse Practitioner) Truitt Merle, MD as Attending Physician (Hematology and Oncology)  Date of Service:  02/27/2023  CHIEF COMPLAINT: f/u of  multiorgan AL amyloidosis    CURRENT THERAPY:   Myeloma Daratumumab SQ q28d  ASSESSMENT:  Caleb Simmons is a 72 y.o. 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  He tolerated well. -He began daratumumab injection 09/27/21. Chemo was held briefly following stroke 01/14/22. -not a candidate for biphosphonate due to renal failure -he completed 6 cycle induction chemo with CyBorD on 03/21/22 and moved to monthly Dara maintenance on 03/28/22. Plan to continue for a total of 2 years (24 cycles). -He has had good response to treatment based on his M protein and light chain level.  ESRD (end stage renal disease) ([place]) -on HD MF     PLAN: -lab reviewed-  Hg 7.1, will give 2 units blood transfusion today or in the next few days. -proceed with C19  DARA today and continue every 4 weeks -f/u in 3 months    INTERVAL HISTORY:  Caleb Simmons is here for a follow up of  multiorgan AL amyloidosis   He was last seen by me on 12/06/2023 He presents to the clinic accompanied by daughter. Pt state that he is doing ok. Pt state his appetite is good. Pt denies having pain and SOB . Pt state he is doing dialysis 2x  a week. Pt state hie bought a bike and that helps.    All other systems were reviewed with the patient and are negative.  MEDICAL HISTORY:  Past Medical History:  Diagnosis Date   Cancer  [facility])     SURGICAL HISTORY: Past Surgical History:  Procedure Laterality Date   APPENDECTOMY     IR ANGIO INTRA EXTRACRAN SEL COM CAROTID INNOMINATE BILAT MOD SED  01/16/2022   IR ANGIO INTRA EXTRACRAN SEL COM CAROTID INNOMINATE BILAT MOD SED  07/16/2022   IR ANGIO VERTEBRAL SEL SUBCLAVIAN INNOMINATE BILAT MOD SED  01/16/2022   IR ANGIO VERTEBRAL SEL SUBCLAVIAN INNOMINATE UNI R MOD SED  07/16/2022   IR ANGIO VERTEBRAL SEL VERTEBRAL UNI L MOD SED  07/16/2022   IR CT HEAD LTD  01/18/2022   IR EMBO ART  VEN HEMORR LYMPH EXTRAV  INC GUIDE ROADMAPPING  08/30/2021   IR FLUORO GUIDE CV LINE RIGHT  08/30/2021   IR FLUORO GUIDE CV LINE RIGHT  09/18/2021   IR FLUORO GUIDE CV LINE RIGHT  05/02/2022   IR PERCUTANEOUS ART THROMBECTOMY/INFUSION INTRACRANIAL INC DIAG ANGIO  01/18/2022   IR RADIOLOGIST EVAL & MGMT  08/02/2022   IR RENAL SELECTIVE  UNI INC S&I MOD SED  08/31/2021   IR US GUIDE VASC ACCESS RIGHT  08/30/2021   IR US GUIDE VASC ACCESS RIGHT  08/30/2021   IR US GUIDE VASC ACCESS RIGHT  09/18/2021   IR US GUIDE VASC ACCESS RIGHT  01/16/2022   IR US GUIDE VASC ACCESS RIGHT  07/16/2022   IR VENOCAVAGRAM SVC  05/02/2022   RADIOLOGY WITH ANESTHESIA N/A 01/18/2022   Procedure: Cerebral  angioplasty with possible stenting;  Surgeon: Luanne Bras, MD;  Location: [facility];  Service: Radiology;  Laterality: N/A;    I have reviewed the social history and family history with the patient and they are unchanged from previous note.  ALLERGIES:  has No Known Allergies.  MEDICATIONS:  Current Outpatient Medications  Medication Sig Dispense Refill   acetaminophen (TYLENOL) 500 MG tablet Take 1,000 mg by mouth every 6 (six) hours as needed for moderate pain.     ALPRAZolam (XANAX) 1 MG tablet Take 1 tablet (1 mg total) by mouth every 8 (eight) hours as needed for anxiety or sleep. 90 tablet 0   aspirin 81 MG EC tablet Take 1 tablet (81 mg total) by mouth daily. Swallow whole. 30 tablet 11   cetirizine (ZYRTEC) 10 MG  tablet Take 10 mg by mouth daily.     dexamethasone (DECADRON) 2 MG tablet Take 1 tablet (2 mg total) by mouth daily. 14 tablet 0   diclofenac Sodium (VOLTAREN) 1 % GEL Apply 1 Application topically 4 (four) times daily as needed (pain).     ezetimibe (ZETIA) 10 MG tablet Take 1 tablet (10 mg total) by mouth daily. 90 tablet 3   folic acid (FOLVITE) 1 MG tablet TAKE 2 TABLETS BY MOUTH EVERY DAY 60 tablet 0   Melatonin 10 MG TABS Take 10 mg by mouth at bedtime.     metoprolol succinate (TOPROL-XL) 25 MG 24 hr tablet TAKE 1/2 TABLET BY MOUTH EVERY DAY 15 tablet 4   midodrine (PROAMATINE) 5 MG tablet TAKE 1 TABLET BY MOUTH 3 TIMES DAILY WITH MEALS 270 tablet 3   mirtazapine (REMERON) 30 MG tablet TAKE 1 TABLET BY MOUTH AT BEDTIME 30 tablet 1   ondansetron (ZOFRAN) 4 MG tablet TAKE 1 TABLET BY MOUTH EVERY 8 HOURS AS NEEDED FOR NAUSEA AND VOMITING 30 tablet 2   oxyCODONE (ROXICODONE) 15 MG immediate release tablet Take 1 tablet (15 mg total) by mouth every 6 (six) hours as needed. 60 tablet 0   pantoprazole (PROTONIX) 40 MG tablet TAKE 1 TABLET 2 TIMES DAILY BEFORE A meal 60 tablet 0   prochlorperazine (COMPAZINE) 5 MG tablet Take 1 tablet (5 mg total) by mouth every 6 (six) hours as needed for nausea or vomiting. 30 tablet 2   SENEXON-S 8.6-50 MG tablet TAKE 1 TABLET BY MOUTH EVERY DAY 30 tablet 6   tamsulosin (FLOMAX) 0.4 MG CAPS capsule TAKE 1 CAPSULE BY MOUTH EVERY DAY 30 capsule 0   zolpidem (AMBIEN) 5 MG tablet Take 1 tablet (5 mg total) by mouth at bedtime as needed. for sleep 30 tablet 0   No current facility-administered medications for this visit.   Facility-Administered Medications Ordered in Other Visits  Medication Dose Route Frequency Provider Last Rate Last Admin   heparin lock flush 100 unit/mL  500 Units Intracatheter Once Truitt Merle, MD       sodium chloride flush (NS) 0.9 % injection 10 mL  10 mL Intracatheter Once Truitt Merle, MD        PHYSICAL EXAMINATION: ECOG PERFORMANCE  STATUS: 2 - Symptomatic, <50% confined to bed  Vitals:   02/27/23 1020  BP: (!) 136/98  Pulse: 80  Resp: 17  Temp: 98.4 F (36.9 C)  SpO2: 99%   Wt Readings from Last 3 Encounters:  02/27/23 146 lb 14.4 oz (66.6 kg)  01/30/23 132 lb 6.4 oz (60.1 kg)  01/02/23 135 lb 4.8 oz (61.4 kg)     GENERAL:alert, no distress  and comfortable SKIN: skin color normal, no rashes or significant lesions EYES: normal, Conjunctiva are pink and non-injected, sclera clear  NEURO: alert & oriented x 3 with fluent speech   LABORATORY DATA:  I have reviewed the data as listed    Latest Ref Rng & Units 02/27/2023    9:46 AM 01/30/2023    9:24 AM 01/02/2023   11:15 AM  CBC  WBC 4.0 - 10.5 K/uL 8.6  10.5  6.9   Hemoglobin 13.0 - 17.0 g/dL 7.1  9.0  9.9   Hematocrit 39.0 - 52.0 % 21.8  27.8  30.2   Platelets 150 - 400 K/uL 159  132  150         Latest Ref Rng & Units 02/27/2023    9:46 AM 01/30/2023    9:24 AM 01/02/2023   11:15 AM  CMP  Glucose 70 - 99 mg/dL 131  116  101   BUN 8 - 23 mg/dL 35  34  39   Creatinine 0.61 - 1.24 mg/dL 3.67  3.89  3.76   Sodium 135 - 145 mmol/L 139  135  139   Potassium 3.5 - 5.1 mmol/L 4.4  3.9  3.7   Chloride 98 - 111 mmol/L 105  100  104   CO2 22 - 32 mmol/L '25  24  26   '$ Calcium 8.9 - 10.3 mg/dL 7.2  8.2  8.6   Total Protein 6.5 - 8.1 g/dL 5.6  5.6  5.8   Total Bilirubin 0.3 - 1.2 mg/dL 0.4  0.5  0.4   Alkaline Phos 38 - 126 U/L 97  92  77   AST 15 - 41 U/L '9  9  10   '$ ALT 0 - 44 U/L '9  5  6       '$ RADIOGRAPHIC STUDIES: I have personally reviewed the radiological images as listed and agreed with the findings in the report. No results found.    Orders Placed This Encounter  Procedures   CBC with Differential ([facility] Only)    Standing Status:   Future    Standing Expiration Date:   03/26/2024   CBC with Differential (Cancer Center Only)    Standing Status:   Future    Standing Expiration Date:   04/23/2024   CBC with Differential (Cancer Center  Only)    Standing Status:   Future    Standing Expiration Date:   05/21/2024   Ferritin    Standing Status:   Standing    Number of Occurrences:   10    Standing Expiration Date:   02/27/2024   Informed Consent Details: Physician/Practitioner Attestation; Transcribe to consent form and obtain patient signature    Order Specific Question:   Physician/Practitioner attestation of informed consent for blood and or blood product transfusion    Answer:   I, the physician/practitioner, attest that I have discussed with the patient the benefits, risks, side effects, alternatives, likelihood of achieving goals and potential problems during recovery for the procedure that I have provided informed consent.    Order Specific Question:   Product(s)    Answer:   All Product(s)   All questions were answered. The patient knows to call the clinic with any problems, questions or concerns. No barriers to learning was detected. The total time spent in the appointment was 30 minutes.     Truitt Merle, MD 02/27/2023   Felicity Coyer, CMA, am acting as scribe for Truitt Merle, MD.  I have reviewed the above documentation for accuracy and completeness, and I agree with the above.

## 2023-02-27 NOTE — Progress Notes (Signed)
Brief nutrition follow-up completed with patient during infusion.  Patient with AL amyloidosis/plasma cell myeloma receiving daratumumab every 28 days.  He is followed by Dr. Burr Medico.  Weight documented as 146 pounds 14 ounces on March 7.  Labs reviewed: Glucose 131, BUN 35, creatinine 3.67, and albumin 3.1,  Patient reports he has a good appetite and he is eating well.  He denies any nutrition impact symptoms.  He continues to receive HD 2 times a week.  He reports he gets protein bars from the dialysis clinic and he is followed by the dietitian at hemodialysis.  Nutrition diagnosis: Inadequate oral intake, improved.  Intervention: Continue small frequent meals and snacks with adequate calories and protein.   Continue oral nutrition supplements.  Monitoring, evaluation, goals: Patient will tolerate adequate calories and protein to minimize weight loss throughout treatment.  Next visit to be scheduled as needed.

## 2023-02-27 NOTE — Patient Instructions (Signed)
[facility]  Discharge Instructions: Thank you for choosing [facility] to provide your oncology and hematology care.   If you have a lab appointment with the [facility], please go directly to the [facility] and check in at the registration area.   Wear comfortable clothing and clothing appropriate for easy access to any Portacath or PICC line.   We strive to give you quality time with your provider. You may need to reschedule your appointment if you arrive late (15 or more minutes).  Arriving late affects you and other patients whose appointments are after yours.  Also, if you miss three or more appointments without notifying the office, you may be dismissed from the clinic at the provider's discretion.      For prescription refill requests, have your pharmacy contact our office and allow 72 hours for refills to be completed.    Today you received the following chemotherapy and/or immunotherapy agents: Daratumumab - Faspro.       To help prevent nausea and vomiting after your treatment, we encourage you to take your nausea medication as directed.  BELOW ARE SYMPTOMS THAT SHOULD BE REPORTED IMMEDIATELY: *FEVER GREATER THAN 100.4 F (38 C) OR HIGHER *CHILLS OR SWEATING *NAUSEA AND VOMITING THAT IS NOT CONTROLLED WITH YOUR NAUSEA MEDICATION *UNUSUAL SHORTNESS OF BREATH *UNUSUAL BRUISING OR BLEEDING *URINARY PROBLEMS (pain or burning when urinating, or frequent urination) *BOWEL PROBLEMS (unusual diarrhea, constipation, pain near the anus) TENDERNESS IN MOUTH AND THROAT WITH OR WITHOUT PRESENCE OF ULCERS (sore throat, sores in mouth, or a toothache) UNUSUAL RASH, SWELLING OR PAIN  UNUSUAL VAGINAL DISCHARGE OR ITCHING   Items with * indicate a potential emergency and should be followed up as soon as possible or go to the Emergency Department if any problems should occur.  Please show the CHEMOTHERAPY ALERT CARD or IMMUNOTHERAPY ALERT  CARD at check-in to the Emergency Department and triage nurse.  Should you have questions after your visit or need to cancel or reschedule your appointment, please contact [facility]  Dept: [phone]  and follow the prompts.  Office hours are 8:00 a.m. to 4:30 p.m. Monday - Friday. Please note that voicemails left after 4:00 p.m. may not be returned until the following business day.  We are closed weekends and major holidays. You have access to a nurse at all times for urgent questions. Please call the main number to the clinic Dept: [phone] and follow the prompts.   For any non-urgent questions, you may also contact your provider using MyChart. We now offer e-Visits for anyone 52 and older to request care online for non-urgent symptoms. For details visit [email or web address].   Also download the MyChart app! Go to the app store, search "MyChart", open the app, select [facility], and log in with your MyChart username and password.

## 2023-02-27 NOTE — Assessment & Plan Note (Signed)
-  lamda light chain disease with renal, cardiac and neuro involvement   -diagnosed in 08/2021 -not a candidate for transplant  --He began weekly oral Cytoxan, dexa and Velcade injection while inpatient on 09/10/21.  He tolerated well. -He began daratumumab injection 09/27/21. Chemo was held briefly following stroke 01/14/22. -not a candidate for biphosphonate due to renal failure -he completed 6 cycle induction chemo with CyBorD on 03/21/22 and moved to monthly Dara maintenance on 03/28/22. Plan to continue for a total of 2 years (24 cycles). -He has had good response to treatment

## 2023-02-27 NOTE — Progress Notes (Signed)
[facility]  [phone]) [phone] Fax:(336) [phone]   Name: Caleb Simmons Date: 02/27/2023 MRN: [id number]  DOB: 20-May-1951  Patient Care Team: Caleb Merle, MD as PCP - General (Hematology) Buntin, Caleb Jumbo, RN (Inactive) as Registered Nurse Pickenpack-Cousar, Caleb Sax, NP as Nurse Practitioner (Nurse Practitioner) Caleb Merle, MD as Attending Physician (Hematology and Oncology)    INTERVAL HISTORY: [place] LUTH is a 72 y.o. 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  Recently admitted and discharged on 01/20/22 after receiving treatment for left MCA CVA. Palliative following for ongoing goals of care discussions and symptom management.   SOCIAL HISTORY:     reports that he has been smoking cigarettes. He has a 6.25 pack-year smoking history. He has never used smokeless tobacco.  ADVANCE DIRECTIVES:  Patient reports completed document.  Son Caleb, Simmons. is his healthcare power of attorney.  CODE STATUS: Full code  PAST MEDICAL HISTORY: Past Medical History:  Diagnosis Date   Cancer ([place])     ALLERGIES:  has No Known Allergies.  MEDICATIONS:  Current Outpatient Medications  Medication Sig Dispense Refill   acetaminophen (TYLENOL) 500 MG tablet Take 1,000 mg by mouth every 6 (six) hours as needed for moderate pain.     ALPRAZolam (XANAX) 1 MG tablet Take 1 tablet (1 mg total) by mouth every 8 (eight) hours as needed for anxiety or sleep. 90 tablet 0   aspirin 81 MG EC tablet Take 1 tablet (81 mg total) by mouth daily. Swallow whole. 30 tablet 11   cetirizine (ZYRTEC) 10 MG tablet Take 10 mg by mouth daily.     dexamethasone (DECADRON) 2 MG tablet Take 1 tablet (2 mg total) by mouth daily. 14 tablet 0   diclofenac Sodium (VOLTAREN) 1 % GEL Apply 1 Application topically 4 (four) times daily as needed (pain).      ezetimibe (ZETIA) 10 MG tablet Take 1 tablet (10 mg total) by mouth daily. 90 tablet 3   folic acid (FOLVITE) 1 MG tablet TAKE 2 TABLETS BY MOUTH EVERY DAY 60 tablet 0   Melatonin 10 MG TABS Take 10 mg by mouth at bedtime.     metoprolol succinate (TOPROL-XL) 25 MG 24 hr tablet TAKE 1/2 TABLET BY MOUTH EVERY DAY 15 tablet 4   midodrine (PROAMATINE) 5 MG tablet TAKE 1 TABLET BY MOUTH 3 TIMES DAILY WITH MEALS 270 tablet 3   mirtazapine (REMERON) 30 MG tablet TAKE 1 TABLET BY MOUTH AT BEDTIME 30 tablet 1   ondansetron (ZOFRAN) 4 MG tablet TAKE 1 TABLET BY MOUTH EVERY 8 HOURS AS NEEDED FOR NAUSEA AND VOMITING 30 tablet 2   oxyCODONE (ROXICODONE) 15 MG immediate release tablet Take 1 tablet (15 mg total) by mouth every 6 (six) hours as needed. 60 tablet 0   pantoprazole (PROTONIX) 40 MG tablet TAKE 1 TABLET 2 TIMES DAILY BEFORE A meal 60 tablet 0   prochlorperazine (COMPAZINE) 5 MG tablet Take 1 tablet (5 mg total) by mouth every 6 (six) hours as needed for nausea or vomiting. 30 tablet 2   SENEXON-S 8.6-50 MG tablet TAKE 1 TABLET BY MOUTH EVERY DAY 30 tablet 6   tamsulosin (FLOMAX) 0.4 MG CAPS capsule TAKE 1 CAPSULE BY MOUTH EVERY DAY 30 capsule 0   zolpidem (AMBIEN) 5 MG tablet Take 1 tablet (5 mg total) by mouth at bedtime as needed. for  sleep 30 tablet 0   No current facility-administered medications for this visit.   Facility-Administered Medications Ordered in Other Visits  Medication Dose Route Frequency Provider Last Rate Last Admin   heparin lock flush 100 unit/mL  500 Units Intracatheter Once Caleb Merle, MD       sodium chloride flush (NS) 0.9 % injection 10 mL  10 mL Intracatheter Once Caleb Merle, MD        VITAL SIGNS: There were no vitals taken for this visit. There were no vitals filed for this visit.    Estimated body mass index is 19 kg/m as calculated from the following:   Height as of 12/19/22: '5\' 10"'$  (1.778 m).   Weight as of 01/30/23: 132 lb 6.4 oz (60.1  kg).   PERFORMANCE STATUS (ECOG) : 2 - Symptomatic, <50% confined to bed  Assessment NAD, thin, in wheelchair  RRR Normal breathing pattern AAO x3  IMPRESSION: Caleb Simmons presents to clinic for follow-up. Caleb Simmons is present. No acute distress. In wheelchair. Patient shares he is feeling much better than weeks prior. Has been able to increase activity some.  Denies nausea, vomiting, constipation, or diarrhea.   Pain Caleb Simmons reports his pain is well controlled. Some days are better than others. Is taking oxycodone as needed.  Tolerating without difficulty.  Does not take around-the-clock.   We will continue to closely monitor.    Decreased Appetite We did a burst dose of dexamethasone due to poor appetite and weight loss. He tolerated well and shares he is eating much better. He took his last dose 5 days ago. Weight today is 146lbs up from 132lbs on 2/8. He is much appreciative of being able to eat foods he enjoy. Will continue with mirtazapine 30 mg at bedtime.    PLAN: Oxy IR 5-10 mg every 4-6 hours as needed for breakthrough pain Will closely monitor appetite and make adjustments if no increase or further weight decline. Education provided on small frequent meals. Followed by Dietician.  Xanax 1 mg every 8 hours as needed for anxiety/sleep Miralax twice daily Senna-S daily Zofran as needed for nausea.  I will plan to see him back in 3-4 weeks in collaboration with his other oncology appointments.   Patient expressed understanding and was in agreement with this plan. He also understands that He can call the clinic at any time with any questions, concerns, or complaints.       Any controlled substances utilized were prescribed in the context of palliative care. PDMP has been reviewed.    Time Total: 30 min   Visit consisted of counseling and education dealing with the complex and emotionally intense issues of symptom management and palliative care in the setting of serious and  potentially life-threatening illness.Greater than 50%  of this time was spent counseling and coordinating care related to the above assessment and plan.  Caleb Simmons, AGPCNP-BC  Palliative Medicine Team/[facility]

## 2023-02-27 NOTE — Assessment & Plan Note (Signed)
-  on HD MF

## 2023-02-27 NOTE — Progress Notes (Signed)
Per Burr Medico MD, ok to treat today with HGB 7.1. Type and screen drawn and blood transfusions scheduled.   Pt states he did not take decadron at home prior to infusion today, Decadron added on to tx day by pharmacy.

## 2023-02-28 LAB — KAPPA/LAMBDA LIGHT CHAINS
Kappa free light chain: 39.1 mg/L — ABNORMAL HIGH (ref 3.3–19.4)
Kappa, lambda light chain ratio: 1.85 — ABNORMAL HIGH (ref 0.26–1.65)
Lambda free light chains: 21.1 mg/L (ref 5.7–26.3)

## 2023-03-01 ENCOUNTER — Other Ambulatory Visit: Payer: Self-pay | Admitting: *Deleted

## 2023-03-01 ENCOUNTER — Inpatient Hospital Stay: Payer: Commercial Managed Care - PPO

## 2023-03-01 DIAGNOSIS — E8581 Light chain (AL) amyloidosis: Secondary | ICD-10-CM

## 2023-03-01 DIAGNOSIS — D649 Anemia, unspecified: Secondary | ICD-10-CM

## 2023-03-01 DIAGNOSIS — Z5112 Encounter for antineoplastic immunotherapy: Secondary | ICD-10-CM | POA: Diagnosis not present

## 2023-03-01 DIAGNOSIS — D638 Anemia in other chronic diseases classified elsewhere: Secondary | ICD-10-CM

## 2023-03-01 LAB — BPAM RBC
Blood Product Expiration Date: 202403292359
Blood Product Expiration Date: 202404072359
Unit Type and Rh: 9500
Unit Type and Rh: 9500

## 2023-03-01 LAB — TYPE AND SCREEN
ABO/RH(D): A NEG
Antibody Screen: POSITIVE
Unit division: 0
Unit division: 0

## 2023-03-01 LAB — SAMPLE TO BLOOD BANK

## 2023-03-01 LAB — PREPARE RBC (CROSSMATCH)

## 2023-03-01 MED ORDER — SODIUM CHLORIDE 0.9% IV SOLUTION: 250.0000 mL | Freq: Once | INTRAVENOUS | Status: AC

## 2023-03-01 MED ORDER — DIPHENHYDRAMINE HCL 25 MG PO CAPS
25.0000 mg | ORAL_CAPSULE | Freq: Once | ORAL | Status: AC
  Administered 2023-03-01: 25 mg via ORAL

## 2023-03-01 MED ORDER — ACETAMINOPHEN 325 MG PO TABS
650.0000 mg | ORAL_TABLET | Freq: Once | ORAL | Status: AC
  Administered 2023-03-01: 650 mg via ORAL

## 2023-03-01 NOTE — Patient Instructions (Signed)

## 2023-03-03 ENCOUNTER — Encounter: Payer: Self-pay | Admitting: Hematology

## 2023-03-03 ENCOUNTER — Other Ambulatory Visit: Payer: Self-pay

## 2023-03-03 ENCOUNTER — Telehealth: Payer: Self-pay

## 2023-03-03 ENCOUNTER — Inpatient Hospital Stay: Payer: Commercial Managed Care - PPO

## 2023-03-03 DIAGNOSIS — Z5112 Encounter for antineoplastic immunotherapy: Secondary | ICD-10-CM | POA: Diagnosis not present

## 2023-03-03 DIAGNOSIS — E8581 Light chain (AL) amyloidosis: Secondary | ICD-10-CM

## 2023-03-03 DIAGNOSIS — D649 Anemia, unspecified: Secondary | ICD-10-CM

## 2023-03-03 DIAGNOSIS — D638 Anemia in other chronic diseases classified elsewhere: Secondary | ICD-10-CM

## 2023-03-03 LAB — PREPARE RBC (CROSSMATCH)

## 2023-03-03 MED ORDER — ACETAMINOPHEN 325 MG PO TABS
650.0000 mg | ORAL_TABLET | Freq: Once | ORAL | Status: AC
  Administered 2023-03-03: 650 mg via ORAL
  Filled 2023-03-03: qty 2

## 2023-03-03 MED ORDER — SODIUM CHLORIDE 0.9% IV SOLUTION
250.0000 mL | Freq: Once | INTRAVENOUS | Status: AC
  Administered 2023-03-03: 250 mL via INTRAVENOUS

## 2023-03-03 MED ORDER — DIPHENHYDRAMINE HCL 25 MG PO CAPS
25.0000 mg | ORAL_CAPSULE | Freq: Once | ORAL | Status: AC
  Administered 2023-03-03: 25 mg via ORAL
  Filled 2023-03-03: qty 1

## 2023-03-03 NOTE — Patient Instructions (Signed)

## 2023-03-03 NOTE — Telephone Encounter (Signed)
Pt SO [name] called reporting pt has swelling in legs and feet, per nancy pt "fluid pills" are managed by nephrologist and the team at he dialysis center, RN educated nancy to call the nephrologist or to mention it to the dialysis team during pt appt today, nancy verbalized understanding of education. No further needs at this time.

## 2023-03-03 NOTE — Telephone Encounter (Signed)
Error duplicate encounter

## 2023-03-04 LAB — BPAM RBC
Blood Product Expiration Date: 202403292359
Blood Product Expiration Date: 202404072359
ISSUE DATE / TIME: 202403091158
ISSUE DATE / TIME: 202403111435
Unit Type and Rh: 9500
Unit Type and Rh: 9500

## 2023-03-04 LAB — TYPE AND SCREEN
ABO/RH(D): A NEG
Antibody Screen: POSITIVE
Unit division: 0
Unit division: 0

## 2023-03-04 LAB — MULTIPLE MYELOMA PANEL, SERUM
Albumin SerPl Elph-Mcnc: 2.7 g/dL — ABNORMAL LOW (ref 2.9–4.4)
Albumin/Glob SerPl: 1.3 (ref 0.7–1.7)
Alpha 1: 0.4 g/dL (ref 0.0–0.4)
Alpha2 Glob SerPl Elph-Mcnc: 0.8 g/dL (ref 0.4–1.0)
B-Globulin SerPl Elph-Mcnc: 0.7 g/dL (ref 0.7–1.3)
Gamma Glob SerPl Elph-Mcnc: 0.3 g/dL — ABNORMAL LOW (ref 0.4–1.8)
Globulin, Total: 2.2 g/dL (ref 2.2–3.9)
IgA: 54 mg/dL — ABNORMAL LOW (ref 61–437)
IgG (Immunoglobin G), Serum: 342 mg/dL — ABNORMAL LOW (ref 603–1613)
IgM (Immunoglobulin M), Srm: 90 mg/dL (ref 15–143)
Total Protein ELP: 4.9 g/dL — ABNORMAL LOW (ref 6.0–8.5)

## 2023-03-05 ENCOUNTER — Other Ambulatory Visit: Payer: Self-pay | Admitting: Nurse Practitioner

## 2023-03-05 DIAGNOSIS — Z515 Encounter for palliative care: Secondary | ICD-10-CM

## 2023-03-05 DIAGNOSIS — G893 Neoplasm related pain (acute) (chronic): Secondary | ICD-10-CM

## 2023-03-05 DIAGNOSIS — E854 Organ-limited amyloidosis: Secondary | ICD-10-CM

## 2023-03-05 DIAGNOSIS — G4700 Insomnia, unspecified: Secondary | ICD-10-CM

## 2023-03-05 DIAGNOSIS — F419 Anxiety disorder, unspecified: Secondary | ICD-10-CM

## 2023-03-05 MED ORDER — ALPRAZOLAM 1 MG PO TABS: 1.0000 mg | ORAL_TABLET | Freq: Three times a day (TID) | ORAL | 0 refills | Status: DC | PRN

## 2023-03-05 MED ORDER — OXYCODONE HCL 15 MG PO TABS: 15.0000 mg | ORAL_TABLET | Freq: Four times a day (QID) | ORAL | 0 refills | Status: DC | PRN

## 2023-03-05 MED ORDER — ZOLPIDEM TARTRATE 5 MG PO TABS: 5.0000 mg | ORAL_TABLET | Freq: Every evening | ORAL | 0 refills | Status: DC | PRN

## 2023-03-05 NOTE — Telephone Encounter (Signed)
From: Caleb Simmons To: Jobe Gibbon, NP Sent: 03/05/2023 11:48 AM EDT Subject: Medication Renewal Request  Refills have been requested for the following medications:   ALPRAZolam Duanne Moron) 1 MG tablet Chesley Noon Pickenpack-Cousar]   zolpidem (AMBIEN) 5 MG tablet [Betzaira Mentel Pickenpack-Cousar]   oxyCODONE (ROXICODONE) 15 MG immediate release tablet Chesley Noon Pickenpack-Cousar]  Preferred pharmacy: [facility], [place] [address]  This message is being sent by Caleb Simmons on behalf of Caleb Simmons

## 2023-03-06 ENCOUNTER — Telehealth: Payer: Self-pay

## 2023-03-06 NOTE — Telephone Encounter (Signed)
Pt SO called to confirm medications were sent to pharmacy no further needs at this time

## 2023-03-07 ENCOUNTER — Other Ambulatory Visit: Payer: Self-pay | Admitting: Nurse Practitioner

## 2023-03-07 ENCOUNTER — Other Ambulatory Visit: Payer: Self-pay | Admitting: Hematology

## 2023-03-13 ENCOUNTER — Other Ambulatory Visit: Payer: Self-pay | Admitting: Nurse Practitioner

## 2023-03-13 DIAGNOSIS — R110 Nausea: Secondary | ICD-10-CM

## 2023-03-13 DIAGNOSIS — E8581 Light chain (AL) amyloidosis: Secondary | ICD-10-CM

## 2023-03-20 ENCOUNTER — Encounter: Payer: Self-pay | Admitting: Hematology

## 2023-03-24 ENCOUNTER — Other Ambulatory Visit: Payer: Self-pay | Admitting: Nurse Practitioner

## 2023-03-24 ENCOUNTER — Telehealth: Payer: Self-pay

## 2023-03-24 DIAGNOSIS — I43 Cardiomyopathy in diseases classified elsewhere: Secondary | ICD-10-CM

## 2023-03-24 DIAGNOSIS — G893 Neoplasm related pain (acute) (chronic): Secondary | ICD-10-CM

## 2023-03-24 DIAGNOSIS — Z515 Encounter for palliative care: Secondary | ICD-10-CM

## 2023-03-24 NOTE — Telephone Encounter (Signed)
Pt SO nancy called for refill, see new orders.  

## 2023-03-26 NOTE — Progress Notes (Unsigned)
[place]  [phone] [phone]) [phone]   Name: Caleb Simmons Date: 03/26/2023 MRN: [id number]  DOB: [birth year]  Patient Care Team: Truitt Merle, MD as PCP - General (Hematology) Buntin, Lavonda Jumbo, RN (Inactive) as Registered Nurse Pickenpack-Cousar, Carlena Sax, NP as Nurse Practitioner (Nurse Practitioner) Truitt Merle, MD as Attending Physician (Hematology and Oncology)    INTERVAL HISTORY: Caleb Simmons is a 72 y.o. 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  Recently admitted and discharged on 01/20/22 after receiving treatment for left MCA CVA. Palliative following for ongoing goals of care discussions and symptom management.   SOCIAL HISTORY:     reports that he has been smoking cigarettes. He has a 6.25 pack-year smoking history. He has never used smokeless tobacco.  ADVANCE DIRECTIVES:  Patient reports completed document.  [name], Caleb Simmons. is his healthcare power of attorney.  CODE STATUS: Full code  PAST MEDICAL HISTORY: Past Medical History:  Diagnosis Date   Cancer ([place])     ALLERGIES:  has No Known Allergies.  MEDICATIONS:  Current Outpatient Medications  Medication Sig Dispense Refill   acetaminophen (TYLENOL) 500 MG tablet Take 1,000 mg by mouth every 6 (six) hours as needed for moderate pain.     ALPRAZolam (XANAX) 1 MG tablet Take 1 tablet (1 mg total) by mouth every 8 (eight) hours as needed for anxiety or sleep. 90 tablet 0   aspirin 81 MG EC tablet Take 1 tablet (81 mg total) by mouth daily. Swallow whole. 30 tablet 11   cetirizine (ZYRTEC) 10 MG tablet Take 10 mg by mouth daily.     dexamethasone (DECADRON) 2 MG tablet Take 1 tablet (2 mg total) by mouth daily. 7 tablet 0   diclofenac Sodium (VOLTAREN) 1 % GEL Apply 1 Application topically 4 (four) times daily as needed (pain).      ezetimibe (ZETIA) 10 MG tablet Take 1 tablet (10 mg total) by mouth daily. 90 tablet 3   folic acid (FOLVITE) 1 MG tablet TAKE 2 TABLETS BY MOUTH EVERY DAY 60 tablet 0   Melatonin 10 MG TABS Take 10 mg by mouth at bedtime.     metoprolol succinate (TOPROL-XL) 25 MG 24 hr tablet TAKE 1/2 TABLET BY MOUTH EVERY DAY 15 tablet 4   midodrine (PROAMATINE) 5 MG tablet TAKE 1 TABLET BY MOUTH 3 TIMES DAILY WITH MEALS 270 tablet 3   mirtazapine (REMERON) 30 MG tablet TAKE 1 TABLET BY MOUTH AT BEDTIME 30 tablet 1   ondansetron (ZOFRAN) 4 MG tablet TAKE 1 TABLET BY MOUTH EVERY 8 HOURS AS NEEDED FOR NAUSEA AND VOMITING 30 tablet 2   oxyCODONE (ROXICODONE) 15 MG immediate release tablet TAKE 1 TABLET BY MOUTH EVERY 6 HOURS AS NEEDED 60 tablet 0   pantoprazole (PROTONIX) 40 MG tablet TAKE 1 TABLET BY MOUTH 2 TIMES DAILY BEFORE A MEAL 60 tablet 0   prochlorperazine (COMPAZINE) 5 MG tablet Take 1 tablet (5 mg total) by mouth every 6 (six) hours as needed for nausea or vomiting. 30 tablet 2   SENEXON-S 8.6-50 MG tablet TAKE 1 TABLET BY MOUTH EVERY DAY 30 tablet 6   tamsulosin (FLOMAX) 0.4 MG CAPS capsule TAKE 1 CAPSULE BY MOUTH EVERY DAY 30 capsule 0   zolpidem (AMBIEN) 5 MG tablet Take 1 tablet (5 mg total) by mouth at bedtime as needed. for sleep 30  tablet 0   No current facility-administered medications for this visit.   Facility-Administered Medications Ordered in Other Visits  Medication Dose Route Frequency Provider Last Rate Last Admin   0.9 %  sodium chloride infusion (Manually program via Guardrails IV Fluids)  250 mL Intravenous Once Truitt Merle, MD       heparin lock flush 100 unit/mL  500 Units Intracatheter Once Truitt Merle, MD       sodium chloride flush (NS) 0.9 % injection 10 mL  10 mL Intracatheter Once Truitt Merle, MD        VITAL SIGNS: There were no vitals taken for this visit. There were no vitals filed for this visit.    Estimated body mass index is 21.08 kg/m as calculated from the  following:   Height as of 02/27/23: 5\' 10"  (1.778 m).   Weight as of 02/27/23: 146 lb 14.4 oz (66.6 kg).   PERFORMANCE STATUS (ECOG) : 2 - Symptomatic, <50% confined to bed  Assessment NAD, thin, in wheelchair  RRR Normal breathing pattern AAO x3  IMPRESSION: I saw patient during his infusion. Caleb Simmons is present. Patient reports he is doing well. Over the past 4-5 days states he has not felt well. Some nausea, fatigue, and minimal appetite. Caleb Simmons shared she tried to encourage different foods however Caleb Simmons was not interested. He does feel that over the last 24 hours this has improved.   Pain Caleb Simmons reports his pain is well controlled. Some days are better than others. Is taking oxycodone as needed.  Tolerating without difficulty.  Does not take around-the-clock.   We will continue to closely monitor.    Decreased Appetite Caleb Simmons and Caleb Simmons expresses concerns with his ongoing struggle with appetite. Some days are better than others, however he tends to have more days of not wanting to eat much. He denies pain or discomfort with eating, no dysphagia, endorses "he just does not have much of an appetite". He has been on mirtazapine which initially was helping but now is unable to notice a difference. We tried a short term dose of dexamethasone which helped a lot and allowed him to gain some weight however appetite diminished after being off of the medication for 3 days.   Education provided on the use of marinol. He is willing to try. Education provided on potential side effects and use.   PLAN: Oxy IR 5-10 mg every 4-6 hours as needed for breakthrough pain Marinol 2.5mg  at bedtime. Will closely monitor appetite and make adjustments if no increase or further weight decline. Education provided on small frequent meals. Followed by Dietician.  Xanax 1 mg every 8 hours as needed for anxiety/sleep Miralax twice daily Senna-S daily Zofran as needed for nausea.  I will plan to see him back in 3-4 weeks  in collaboration with his other oncology appointments.   Patient expressed understanding and was in agreement with this plan. He also understands that He can call the clinic at any time with any questions, concerns, or complaints.     Any controlled substances utilized were prescribed in the context of palliative care. PDMP has been reviewed.    Visit consisted of counseling and education dealing with the complex and emotionally intense issues of symptom management and palliative care in the setting of serious and potentially life-threatening illness.Greater than 50%  of this time was spent counseling and coordinating care related to the above assessment and plan.  Alda Lea, AGPCNP-BC  Palliative Medicine Team/[facility]  *Please  note that this is a verbal dictation therefore any spelling or grammatical errors are due to the "[place] [employer]" system interpretation.

## 2023-03-27 ENCOUNTER — Inpatient Hospital Stay: Payer: Commercial Managed Care - PPO

## 2023-03-27 ENCOUNTER — Inpatient Hospital Stay (HOSPITAL_BASED_OUTPATIENT_CLINIC_OR_DEPARTMENT_OTHER): Payer: Commercial Managed Care - PPO | Admitting: Nurse Practitioner

## 2023-03-27 ENCOUNTER — Other Ambulatory Visit: Payer: Self-pay

## 2023-03-27 ENCOUNTER — Other Ambulatory Visit: Payer: Self-pay | Admitting: Hematology

## 2023-03-27 ENCOUNTER — Inpatient Hospital Stay: Payer: Commercial Managed Care - PPO | Attending: Hematology

## 2023-03-27 ENCOUNTER — Encounter: Payer: Self-pay | Admitting: Nurse Practitioner

## 2023-03-27 VITALS — BP 132/76 | HR 70 | Temp 97.8°F | Resp 16 | Wt 137.0 lb

## 2023-03-27 DIAGNOSIS — E8581 Light chain (AL) amyloidosis: Secondary | ICD-10-CM

## 2023-03-27 DIAGNOSIS — Z992 Dependence on renal dialysis: Secondary | ICD-10-CM | POA: Diagnosis not present

## 2023-03-27 DIAGNOSIS — G893 Neoplasm related pain (acute) (chronic): Secondary | ICD-10-CM | POA: Diagnosis not present

## 2023-03-27 DIAGNOSIS — D638 Anemia in other chronic diseases classified elsewhere: Secondary | ICD-10-CM

## 2023-03-27 DIAGNOSIS — Z515 Encounter for palliative care: Secondary | ICD-10-CM | POA: Insufficient documentation

## 2023-03-27 DIAGNOSIS — N186 End stage renal disease: Secondary | ICD-10-CM | POA: Insufficient documentation

## 2023-03-27 DIAGNOSIS — R634 Abnormal weight loss: Secondary | ICD-10-CM

## 2023-03-27 DIAGNOSIS — R630 Anorexia: Secondary | ICD-10-CM | POA: Diagnosis not present

## 2023-03-27 DIAGNOSIS — C9000 Multiple myeloma not having achieved remission: Secondary | ICD-10-CM | POA: Diagnosis present

## 2023-03-27 DIAGNOSIS — Z5112 Encounter for antineoplastic immunotherapy: Secondary | ICD-10-CM | POA: Diagnosis present

## 2023-03-27 LAB — CBC WITH DIFFERENTIAL/PLATELET
Abs Immature Granulocytes: 0.01 10*3/uL (ref 0.00–0.07)
Basophils Absolute: 0.1 10*3/uL (ref 0.0–0.1)
Basophils Relative: 2 %
Eosinophils Absolute: 0.1 10*3/uL (ref 0.0–0.5)
Eosinophils Relative: 2 %
HCT: 36.5 % — ABNORMAL LOW (ref 39.0–52.0)
Hemoglobin: 11.6 g/dL — ABNORMAL LOW (ref 13.0–17.0)
Immature Granulocytes: 0 %
Lymphocytes Relative: 23 %
Lymphs Abs: 1.4 10*3/uL (ref 0.7–4.0)
MCH: 30.4 pg (ref 26.0–34.0)
MCHC: 31.8 g/dL (ref 30.0–36.0)
MCV: 95.8 fL (ref 80.0–100.0)
Monocytes Absolute: 0.4 10*3/uL (ref 0.1–1.0)
Monocytes Relative: 6 %
Neutro Abs: 4.2 10*3/uL (ref 1.7–7.7)
Neutrophils Relative %: 67 %
Platelets: 155 10*3/uL (ref 150–400)
RBC: 3.81 MIL/uL — ABNORMAL LOW (ref 4.22–5.81)
RDW: 18.1 % — ABNORMAL HIGH (ref 11.5–15.5)
WBC: 6.2 10*3/uL (ref 4.0–10.5)
nRBC: 0 % (ref 0.0–0.2)

## 2023-03-27 LAB — COMPREHENSIVE METABOLIC PANEL
ALT: 5 U/L (ref 0–44)
AST: 7 U/L — ABNORMAL LOW (ref 15–41)
Albumin: 3.5 g/dL (ref 3.5–5.0)
Alkaline Phosphatase: 101 U/L (ref 38–126)
Anion gap: 10 (ref 5–15)
BUN: 34 mg/dL — ABNORMAL HIGH (ref 8–23)
CO2: 23 mmol/L (ref 22–32)
Calcium: 9.2 mg/dL (ref 8.9–10.3)
Chloride: 104 mmol/L (ref 98–111)
Creatinine, Ser: 3.95 mg/dL — ABNORMAL HIGH (ref 0.61–1.24)
GFR, Estimated: 15 mL/min — ABNORMAL LOW (ref >60)
Glucose, Bld: 150 mg/dL — ABNORMAL HIGH (ref 70–99)
Potassium: 4.4 mmol/L (ref 3.5–5.1)
Sodium: 137 mmol/L (ref 135–145)
Total Bilirubin: 0.5 mg/dL (ref 0.3–1.2)
Total Protein: 6 g/dL — ABNORMAL LOW (ref 6.5–8.1)

## 2023-03-27 LAB — FERRITIN: Ferritin: 1054 ng/mL — ABNORMAL HIGH (ref 24–336)

## 2023-03-27 MED ORDER — DRONABINOL 2.5 MG PO CAPS
2.5000 mg | ORAL_CAPSULE | Freq: Every day | ORAL | 0 refills | Status: DC
Start: 2023-03-27 — End: 2023-06-19

## 2023-03-27 MED ORDER — DEXAMETHASONE 4 MG PO TABS
20.0000 mg | ORAL_TABLET | Freq: Once | ORAL | Status: AC
  Administered 2023-03-27: 20 mg via ORAL
  Filled 2023-03-27: qty 5

## 2023-03-27 MED ORDER — DIPHENHYDRAMINE HCL 25 MG PO CAPS
50.0000 mg | ORAL_CAPSULE | Freq: Once | ORAL | Status: AC
  Administered 2023-03-27: 50 mg via ORAL
  Filled 2023-03-27: qty 2

## 2023-03-27 MED ORDER — DEXAMETHASONE 4 MG PO TABS: 20.0000 mg | ORAL_TABLET | ORAL | 1 refills | Status: DC

## 2023-03-27 MED ORDER — ACETAMINOPHEN 325 MG PO TABS
650.0000 mg | ORAL_TABLET | Freq: Once | ORAL | Status: AC
  Administered 2023-03-27: 650 mg via ORAL
  Filled 2023-03-27: qty 2

## 2023-03-27 MED ORDER — DARATUMUMAB-HYALURONIDASE-FIHJ 1800-30000 MG-UT/15ML ~~LOC~~ SOLN
1800.0000 mg | Freq: Once | SUBCUTANEOUS | Status: AC
  Administered 2023-03-27: 1800 mg via SUBCUTANEOUS
  Filled 2023-03-27: qty 15

## 2023-03-27 NOTE — Patient Instructions (Signed)
[facility]  Discharge Instructions: Thank you for choosing [facility] to provide your oncology and hematology care.   If you have a lab appointment with the Cancer Center, please go directly to the Cancer Center and check in at the registration area.   Wear comfortable clothing and clothing appropriate for easy access to any Portacath or PICC line.   We strive to give you quality time with your provider. You may need to reschedule your appointment if you arrive late (15 or more minutes).  Arriving late affects you and other patients whose appointments are after yours.  Also, if you miss three or more appointments without notifying the office, you may be dismissed from the clinic at the provider's discretion.      For prescription refill requests, have your pharmacy contact our office and allow 72 hours for refills to be completed.    Today you received the following chemotherapy and/or immunotherapy agents Darzalex Faspro      To help prevent nausea and vomiting after your treatment, we encourage you to take your nausea medication as directed.  BELOW ARE SYMPTOMS THAT SHOULD BE REPORTED IMMEDIATELY: *FEVER GREATER THAN 100.4 F (38 C) OR HIGHER *CHILLS OR SWEATING *NAUSEA AND VOMITING THAT IS NOT CONTROLLED WITH YOUR NAUSEA MEDICATION *UNUSUAL SHORTNESS OF BREATH *UNUSUAL BRUISING OR BLEEDING *URINARY PROBLEMS (pain or burning when urinating, or frequent urination) *BOWEL PROBLEMS (unusual diarrhea, constipation, pain near the anus) TENDERNESS IN MOUTH AND THROAT WITH OR WITHOUT PRESENCE OF ULCERS (sore throat, sores in mouth, or a toothache) UNUSUAL RASH, SWELLING OR PAIN  UNUSUAL VAGINAL DISCHARGE OR ITCHING   Items with * indicate a potential emergency and should be followed up as soon as possible or go to the Emergency Department if any problems should occur.  Please show the CHEMOTHERAPY ALERT CARD or IMMUNOTHERAPY ALERT CARD at  check-in to the Emergency Department and triage nurse.  Should you have questions after your visit or need to cancel or reschedule your appointment, please contact [facility]  Dept: [phone]  and follow the prompts.  Office hours are 8:00 a.m. to 4:30 p.m. Monday - Friday. Please note that voicemails left after 4:00 p.m. may not be returned until the following business day.  We are closed weekends and major holidays. You have access to a nurse at all times for urgent questions. Please call the main number to the clinic Dept: [phone] and follow the prompts.   For any non-urgent questions, you may also contact your provider using MyChart. We now offer e-Visits for anyone 18 and older to request care online for non-urgent symptoms. For details visit [email or web address].   Also download the MyChart app! Go to the app store, search "MyChart", open the app, select [facility], and log in with your MyChart username and password.  

## 2023-03-27 NOTE — Progress Notes (Signed)
Pt reported to infusion today with c/o feeling worn down and "crummy" the last few days. The Pt informed this RN that the dialysis center stopped the iron injection d/t diarrhea and he believes that is why he has felt bad. This RN made Dr. Burr Medico aware.  Pt informed this RN that he did not take PO dex at home prior to appt today as he no longer has the prescription. This RN reached out to Dr. Burr Medico regarding dex as a premed for tx.  Kolleen RPH ordered dex 20 mg PO to be given once during infusion today as a premedication prior to tx.  Dr. Burr Medico ordered refill of dexamethasone 20 mg PO to be taken at home 1-2 hours prior to chemo injection. Dr. Burr Medico informed this RN that she would check iron levels at his next appointment.  This RN confirmed that Pt's pharmacy had received dexamethasone prescription prior to discharge. This RN educated Pt and caregiver on prescription. Pt and caregiver verbalized understanding and Pt's caregiver stated "we will pick up the prescription on our way home today."

## 2023-03-28 LAB — KAPPA/LAMBDA LIGHT CHAINS
Kappa free light chain: 41.6 mg/L — ABNORMAL HIGH (ref 3.3–19.4)
Kappa, lambda light chain ratio: 1.93 — ABNORMAL HIGH (ref 0.26–1.65)
Lambda free light chains: 21.6 mg/L (ref 5.7–26.3)

## 2023-04-02 ENCOUNTER — Other Ambulatory Visit: Payer: Self-pay

## 2023-04-02 ENCOUNTER — Other Ambulatory Visit: Payer: Self-pay | Admitting: Hematology

## 2023-04-07 ENCOUNTER — Other Ambulatory Visit: Payer: Self-pay | Admitting: Nurse Practitioner

## 2023-04-07 DIAGNOSIS — Z515 Encounter for palliative care: Secondary | ICD-10-CM

## 2023-04-07 DIAGNOSIS — E854 Organ-limited amyloidosis: Secondary | ICD-10-CM

## 2023-04-07 DIAGNOSIS — G893 Neoplasm related pain (acute) (chronic): Secondary | ICD-10-CM

## 2023-04-07 DIAGNOSIS — F419 Anxiety disorder, unspecified: Secondary | ICD-10-CM

## 2023-04-07 DIAGNOSIS — G4700 Insomnia, unspecified: Secondary | ICD-10-CM

## 2023-04-07 MED ORDER — ZOLPIDEM TARTRATE 5 MG PO TABS
5.0000 mg | ORAL_TABLET | Freq: Every evening | ORAL | 0 refills | Status: DC | PRN
Start: 2023-04-07 — End: 2023-05-07

## 2023-04-07 MED ORDER — ALPRAZOLAM 1 MG PO TABS
1.0000 mg | ORAL_TABLET | Freq: Three times a day (TID) | ORAL | 0 refills | Status: DC | PRN
Start: 2023-04-07 — End: 2023-05-15

## 2023-04-07 MED ORDER — OXYCODONE HCL 15 MG PO TABS
15.0000 mg | ORAL_TABLET | Freq: Four times a day (QID) | ORAL | 0 refills | Status: DC | PRN
Start: 2023-04-07 — End: 2023-04-21

## 2023-04-07 NOTE — Telephone Encounter (Signed)
From: Caleb Simmons To: [name], NP Sent: 04/07/2023 6:55 AM EDT Subject: Medication Renewal Request  Refills have been requested for the following medications:   ALPRAZolam Prudy Feeler) 1 MG tablet [name]  Preferred pharmacy: [facility] - [place], [place] - [address]  This message is being sent by [name] on behalf of Caleb Simmons

## 2023-04-07 NOTE — Telephone Encounter (Signed)
From: Caleb Simmons To: Caleb Reel, NP Sent: 04/05/2023 11:10 AM EDT Subject: Medication Renewal Request  Refills have been requested for the following medications:   oxyCODONE (ROXICODONE) 15 MG immediate release tablet Caleb Simmons]  Preferred pharmacy: [facility] - [place], [place] - [address]  This message is being sent by Caleb Simmons on behalf of Caleb Simmons

## 2023-04-07 NOTE — Telephone Encounter (Signed)
From: [name] To: [name], NP Sent: 04/05/2023 11:12 AM EDT Subject: Medication Renewal Request  Refills have been requested for the following medications:   zolpidem (AMBIEN) 5 MG tablet [name]  Preferred pharmacy: [facility] - [place], [place] - [address]  This message is being sent by [name] on behalf of [name]

## 2023-04-15 ENCOUNTER — Ambulatory Visit (HOSPITAL_COMMUNITY)
Admission: RE | Admit: 2023-04-15 | Discharge: 2023-04-15 | Disposition: A | Discharge status: Home / Self Care | Payer: Medicare Other | Source: Ambulatory Visit | Attending: Family Medicine | Admitting: Family Medicine

## 2023-04-15 ENCOUNTER — Encounter (HOSPITAL_COMMUNITY): Payer: Self-pay

## 2023-04-15 VITALS — BP 128/88 | HR 80 | Wt 136.6 lb

## 2023-04-15 DIAGNOSIS — Z9221 Personal history of antineoplastic chemotherapy: Secondary | ICD-10-CM | POA: Diagnosis not present

## 2023-04-15 DIAGNOSIS — N186 End stage renal disease: Secondary | ICD-10-CM | POA: Diagnosis not present

## 2023-04-15 DIAGNOSIS — E8581 Light chain (AL) amyloidosis: Secondary | ICD-10-CM | POA: Diagnosis not present

## 2023-04-15 DIAGNOSIS — Z8673 Personal history of transient ischemic attack (TIA), and cerebral infarction without residual deficits: Secondary | ICD-10-CM | POA: Diagnosis not present

## 2023-04-15 DIAGNOSIS — I951 Orthostatic hypotension: Secondary | ICD-10-CM | POA: Diagnosis not present

## 2023-04-15 DIAGNOSIS — Z79899 Other long term (current) drug therapy: Secondary | ICD-10-CM | POA: Insufficient documentation

## 2023-04-15 DIAGNOSIS — I639 Cerebral infarction, unspecified: Secondary | ICD-10-CM

## 2023-04-15 DIAGNOSIS — E854 Organ-limited amyloidosis: Secondary | ICD-10-CM

## 2023-04-15 DIAGNOSIS — R002 Palpitations: Secondary | ICD-10-CM

## 2023-04-15 DIAGNOSIS — F17210 Nicotine dependence, cigarettes, uncomplicated: Secondary | ICD-10-CM | POA: Insufficient documentation

## 2023-04-15 DIAGNOSIS — Z8579 Personal history of other malignant neoplasms of lymphoid, hematopoietic and related tissues: Secondary | ICD-10-CM | POA: Diagnosis not present

## 2023-04-15 DIAGNOSIS — Z992 Dependence on renal dialysis: Secondary | ICD-10-CM | POA: Diagnosis not present

## 2023-04-15 DIAGNOSIS — I43 Cardiomyopathy in diseases classified elsewhere: Secondary | ICD-10-CM

## 2023-04-15 DIAGNOSIS — Z7969 Long term (current) use of other immunomodulators and immunosuppressants: Secondary | ICD-10-CM | POA: Insufficient documentation

## 2023-04-15 DIAGNOSIS — I6522 Occlusion and stenosis of left carotid artery: Secondary | ICD-10-CM | POA: Diagnosis not present

## 2023-04-15 DIAGNOSIS — Z7982 Long term (current) use of aspirin: Secondary | ICD-10-CM | POA: Insufficient documentation

## 2023-04-15 DIAGNOSIS — E785 Hyperlipidemia, unspecified: Secondary | ICD-10-CM | POA: Diagnosis not present

## 2023-04-15 DIAGNOSIS — Z720 Tobacco use: Secondary | ICD-10-CM

## 2023-04-15 NOTE — Patient Instructions (Signed)
EKG done today.  No Labs done today.   No medication changes were made. Please continue all current medications as prescribed.  You have [facility]. They will contact you to schedule an appointment.   Your physician recommends that you schedule a follow-up appointment in: 6 months. Please contact our office in September to schedule a October appointment.   If you have any questions or concerns before your next appointment please send [place] a message through [employer] or call our office at [phone].    TO LEAVE A MESSAGE FOR THE NURSE SELECT OPTION 2, PLEASE LEAVE A MESSAGE INCLUDING: YOUR NAME DATE OF BIRTH CALL BACK NUMBER REASON FOR CALL**this is important as we prioritize the call backs  YOU WILL RECEIVE A CALL BACK THE SAME DAY AS LONG AS YOU CALL BEFORE 4:00 PM   Do the following things EVERYDAY: Weigh yourself in the morning before breakfast. Write it down and keep it in a log. Take your medicines as prescribed Eat low salt foods--Limit salt (sodium) to 2000 mg per day.  Stay as active as you can everyday Limit all fluids for the day to less than 2 liters   At the [facility], you and your health needs are our priority. As part of our continuing mission to provide you with exceptional heart care, we have created designated Provider Care Teams. These Care Teams include your primary Cardiologist (physician) and Advanced Practice Providers (APPs- Physician Assistants and Nurse Practitioners) who all work together to provide you with the care you need, when you need it.   You may see any of the following providers on your designated Care Team at your next follow up: Dr Arvilla Meres Dr Marca Ancona Dr. Marcos Eke, NP Robbie Lis, [place] [facility] [place], [place] Brynda Peon, NP Karle Plumber, PharmD   Please be sure to bring in all your medications bottles to every appointment.    Thank you for  choosing [facility]

## 2023-04-15 NOTE — Progress Notes (Signed)
PCP: [name], MD Oncology: Dr. Mosetta Putt Cardiology: Dr. Shirlee Latch  72 y.o. with minimal past history was admitted in 9/22 with AKI, group B strep bacteremia, and acute CHF.  He had to start on dialysis.  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  Echo showed EF 45-50%, moderate LVH, small pericardial effusion. Cardiac MRI showed EF 38%, moderate LVH, small pericardial effusion, elevated T1 suggestive of cardiac amyloidosis. He completed chemotherapy with Velcade and Cytoxan, and is now getting monthly daratumumab.   Seen in clinic 11/22, compression hose and midodrine started for orthostasis.  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 Evaluated by IR and underwent revascularization on 01/18/22 but re-occluded. IR recommended CTA head and neck in 8 weeks. Echo with EF 55-60%, moderate LVH, grade I DD, normal RV. Discharged with outpatient neuro rehab.  Echo (8/23): EF 55-60%, mild LVH, grade I DD, normal RV  Today he returns for HF follow up. Overall feeling fine. He bought a new electric bike and enjoys riding it. He stays busy around the house, mows grass (ride mower) without dyspnea. Recently inducted into [address] [employer] [place].  No issues at dialysis, goes 2x/week, remains on midodrine. He makes little urine. Denies palpitations, abnormal bleeding, CP, dizziness, edema, or PND/Orthopnea. Appetite ok. No fever or chills. Weight at home 135 pounds. Taking all medications. Smokes 1/2 ppd, not ready to quit.  ECG (personally reviewed): NSR 76 bpm  Labs (10/22): hgb 8.9 Labs (1/23): hgb 7.8 Labs (4/24): K 4.4, creatinine 3.95, hgb 11.6  PMH: 1. Nephrolithiasis 2. 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  - Treating with Cytoxan and Velcade.  3. Anemia 4. 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  - Cardiac MRI  (9/22): EF 38%, moderate LVH, small pericardial effusion, elevated T1 suggestive of cardiac amyloidosis.   - Echo 1/23 EF 55-60%, mild LVH, Grade I DD, normal RV.  - Echo (8/23): EF 55-60%, mild LVH, grade I DD, normal RV 6. Orthostatic hypotension.  7. 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  8. Palpitations: Zio monitor in 2/23 with 2 runs NSVT, multiple runs SVT, no atrial fibrillation.   Social History   Socioeconomic History   Marital status: Married    Spouse name: Not on file   Number of children: Not on file   Years of education: Not on file   Highest education level: Not on file  Occupational History   Not on file  Tobacco Use   Smoking status: Some Days    Packs/day: 0.25    Years: 25.00    Additional pack years: 0.00    Total pack years: 6.25    Types: Cigarettes   Smokeless tobacco: Never  Substance and Sexual Activity   Alcohol use: Not on file   Drug use: Not on file   Sexual activity: Not on file  Other Topics Concern   Not on file  Social History Narrative   Not on file   Social Determinants of Health   Financial Resource Strain: Low Risk  (09/11/2021)   Overall Financial Resource Strain (CARDIA)    Difficulty of Paying Living Expenses: Not very hard  Food Insecurity: No Food Insecurity (09/11/2021)   Hunger Vital Sign    Worried About Running Out of Food in the Last Year: Never true    Ran Out  of Food in the Last Year: Never true  Transportation Needs: No Transportation Needs (09/11/2021)   PRAPARE - Administrator, Civil Service (Medical): No    Lack of Transportation (Non-Medical): No  Physical Activity: Not on file  Stress: Not on file  Social Connections: Not on file  Intimate Partner Violence: Not on file   No family history on file.  ROS: All systems reviewed and negative except as per HPI.   Current Outpatient Medications  Medication Sig Dispense Refill   acetaminophen (TYLENOL) 500 MG tablet Take  1,000 mg by mouth every 6 (six) hours as needed for moderate pain.     ALPRAZolam (XANAX) 1 MG tablet Take 1 tablet (1 mg total) by mouth every 8 (eight) hours as needed for anxiety or sleep. 90 tablet 0   aspirin 81 MG EC tablet Take 1 tablet (81 mg total) by mouth daily. Swallow whole. 30 tablet 11   cetirizine (ZYRTEC) 10 MG tablet Take 10 mg by mouth daily.     dexamethasone (DECADRON) 4 MG tablet Take 5 tablets (20 mg total) by mouth once a week. Take 1-2 hour before chemo injection 30 tablet 1   diclofenac Sodium (VOLTAREN) 1 % GEL Apply 1 Application topically 4 (four) times daily as needed (pain).     dronabinol (MARINOL) 2.5 MG capsule Take 1 capsule (2.5 mg total) by mouth daily in the afternoon. 30 capsule 0   ezetimibe (ZETIA) 10 MG tablet Take 1 tablet (10 mg total) by mouth daily. 90 tablet 3   folic acid (FOLVITE) 1 MG tablet TAKE 2 TABLETS BY MOUTH EVERY DAY 60 tablet 0   Melatonin 10 MG TABS Take 10 mg by mouth at bedtime.     metoprolol succinate (TOPROL-XL) 25 MG 24 hr tablet TAKE 1/2 TABLET BY MOUTH EVERY DAY 15 tablet 4   midodrine (PROAMATINE) 5 MG tablet TAKE 1 TABLET BY MOUTH 3 TIMES DAILY WITH MEALS 270 tablet 3   mirtazapine (REMERON) 30 MG tablet TAKE 1 TABLET BY MOUTH AT BEDTIME 30 tablet 1   ondansetron (ZOFRAN) 4 MG tablet TAKE 1 TABLET BY MOUTH EVERY 8 HOURS AS NEEDED FOR NAUSEA AND VOMITING 30 tablet 2   oxyCODONE (ROXICODONE) 15 MG immediate release tablet Take 1 tablet (15 mg total) by mouth every 6 (six) hours as needed. 60 tablet 0   pantoprazole (PROTONIX) 40 MG tablet TAKE 1 TABLET BY MOUTH 2 TIMES DAILY BEFORE MEALS 60 tablet 0   prochlorperazine (COMPAZINE) 5 MG tablet Take 1 tablet (5 mg total) by mouth every 6 (six) hours as needed for nausea or vomiting. 30 tablet 2   SENEXON-S 8.6-50 MG tablet TAKE 1 TABLET BY MOUTH EVERY DAY 30 tablet 6   tamsulosin (FLOMAX) 0.4 MG CAPS capsule TAKE 1 CAPSULE BY MOUTH EVERY DAY 30 capsule 0   zolpidem (AMBIEN) 5 MG  tablet Take 1 tablet (5 mg total) by mouth at bedtime as needed. for sleep 30 tablet 0   No current facility-administered medications for this encounter.   Facility-Administered Medications Ordered in Other Encounters  Medication Dose Route Frequency Provider Last Rate Last Admin   0.9 %  sodium chloride infusion (Manually program via Guardrails IV Fluids)  250 mL Intravenous Once [name], MD       heparin lock flush 100 unit/mL  500 Units Intracatheter Once [name], MD       sodium chloride flush (NS) 0.9 % injection 10 mL  10 mL Intracatheter Once  [name], MD       Wt Readings from Last 3 Encounters:  04/15/23 62 kg (136 lb 9.6 oz)  03/27/23 62.1 kg (137 lb)  02/27/23 66.6 kg (146 lb 14.4 oz)   BP 128/88   Pulse 80   Wt 62 kg (136 lb 9.6 oz)   SpO2 98%   BMI 19.60 kg/m  Physical Exam General:  NAD. No resp difficulty, walked into clinic, thin HEENT: Normal Neck: Supple. No JVD. Carotids 2+ bilat; no bruits. No lymphadenopathy or thryomegaly appreciated. Cor: PMI nondisplaced. Regular rate & rhythm. No rubs, gallops or murmurs. Lungs: Diminished throughout, [facility] looks ok Abdomen: Soft, nontender, nondistended. No hepatosplenomegaly. No bruits or masses. Good bowel sounds. Extremities: No cyanosis, clubbing, rash, edema Neuro: Alert & oriented x 3, cranial nerves grossly intact. Moves all 4 extremities w/o difficulty. Affect pleasant.  Assessment/Plan: 1. 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  He has biopsy-proven AL amyloidosis from renal and bone marrow biopsies.  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  EF was low (38%) on cardiac MRI, but GDMT is limited by ESRD and orthostatic hypotension.  Echo 1/23 EF 55-60%, mild LVH, normal RV. Echo (8/23) EF 55-60%, mild LVH. He is not volume overloaded on exam. He has finished chemotherapy with Velcade and  Cytoxan, now getting monthly daratumumab.  - Seems to be doing well. 2. 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 Seems better now.  - Continue midodrine 5 mg tid.  - Continue graded compression stockings during the day.  3. ESRD: Suspect due to amyloidosis.  There is some hope that he may be able to come off HD. Currently only dialyzing twice a week.  4. CVA: 1/23 left MCA, left ICA occlusion/high grade stenosis. Neuro IR attempted re-vascularization.  - Continue ASA 81 daily. - Myalgias with atorvastatin and stopped it.  Re-refer to Lipid clinic for Repatha.  5. Tobacco Use: Discussed cessation, not ready. 6. Palpitations: Zio monitor in 2/23 showed 2 NSVT runs and multiple SVT runs. No further symptoms. - Continue Toprol XL 12.5 mg daily.   Followup 6 months with Dr. Shirlee Latch.  Anderson [place] Aaira Oestreicher FNP-BC. 04/15/2023

## 2023-04-21 ENCOUNTER — Other Ambulatory Visit: Payer: Self-pay

## 2023-04-21 DIAGNOSIS — Z515 Encounter for palliative care: Secondary | ICD-10-CM

## 2023-04-21 DIAGNOSIS — G893 Neoplasm related pain (acute) (chronic): Secondary | ICD-10-CM

## 2023-04-21 DIAGNOSIS — I43 Cardiomyopathy in diseases classified elsewhere: Secondary | ICD-10-CM

## 2023-04-21 MED ORDER — OXYCODONE HCL 15 MG PO TABS
15.0000 mg | ORAL_TABLET | Freq: Four times a day (QID) | ORAL | 0 refills | Status: DC | PRN
Start: 2023-04-21 — End: 2023-05-07

## 2023-04-21 NOTE — Telephone Encounter (Signed)
Pt SO Caleb Simmons called for refill, see new orders.  

## 2023-04-23 NOTE — Progress Notes (Unsigned)
Palliative Medicine [facility]  [phone] [phone]) [phone]   Name: Caleb Simmons Date: 04/23/2023 MRN: [id number]  DOB: 08-30-1951  Patient Care Team: [name], MD as PCP - General (Hematology) Buntin, Daisy Floro, RN (Inactive) as Registered Nurse Pickenpack-Cousar, Arty Baumgartner, NP as Nurse Practitioner (Nurse Practitioner) [name], MD as Attending Physician (Hematology and Oncology)    INTERVAL HISTORY: Caleb Simmons is a 72 y.o. 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  Recently admitted and discharged on 01/20/22 after receiving treatment for left MCA CVA. Palliative following for ongoing goals of care discussions and symptom management.   SOCIAL HISTORY:     reports that he has been smoking cigarettes. He has a 6.25 pack-year smoking history. He has never used smokeless tobacco.  ADVANCE DIRECTIVES:  Patient reports completed document.  [name], Caleb Simmons. is his healthcare power of attorney.  CODE STATUS: Full code  PAST MEDICAL HISTORY: Past Medical History:  Diagnosis Date   Cancer (HCC)     ALLERGIES:  has No Known Allergies.  MEDICATIONS:  Current Outpatient Medications  Medication Sig Dispense Refill   acetaminophen (TYLENOL) 500 MG tablet Take 1,000 mg by mouth every 6 (six) hours as needed for moderate pain.     ALPRAZolam (XANAX) 1 MG tablet Take 1 tablet (1 mg total) by mouth every 8 (eight) hours as needed for anxiety or sleep. 90 tablet 0   aspirin 81 MG EC tablet Take 1 tablet (81 mg total) by mouth daily. Swallow whole. 30 tablet 11   cetirizine (ZYRTEC) 10 MG tablet Take 10 mg by mouth daily.     dexamethasone (DECADRON) 4 MG tablet Take 5 tablets (20 mg total) by mouth once a week. Take 1-2 hour before chemo injection 30 tablet 1   diclofenac Sodium (VOLTAREN) 1 % GEL Apply 1 Application  topically 4 (four) times daily as needed (pain).     dronabinol (MARINOL) 2.5 MG capsule Take 1 capsule (2.5 mg total) by mouth daily in the afternoon. 30 capsule 0   ezetimibe (ZETIA) 10 MG tablet Take 1 tablet (10 mg total) by mouth daily. 90 tablet 3   folic acid (FOLVITE) 1 MG tablet TAKE 2 TABLETS BY MOUTH EVERY DAY 60 tablet 0   Melatonin 10 MG TABS Take 10 mg by mouth at bedtime.     metoprolol succinate (TOPROL-XL) 25 MG 24 hr tablet TAKE 1/2 TABLET BY MOUTH EVERY DAY 15 tablet 4   midodrine (PROAMATINE) 5 MG tablet TAKE 1 TABLET BY MOUTH 3 TIMES DAILY WITH MEALS 270 tablet 3   mirtazapine (REMERON) 30 MG tablet TAKE 1 TABLET BY MOUTH AT BEDTIME 30 tablet 1   ondansetron (ZOFRAN) 4 MG tablet TAKE 1 TABLET BY MOUTH EVERY 8 HOURS AS NEEDED FOR NAUSEA AND VOMITING 30 tablet 2   oxyCODONE (ROXICODONE) 15 MG immediate release tablet Take 1 tablet (15 mg total) by mouth every 6 (six) hours as needed. 60 tablet 0   pantoprazole (PROTONIX) 40 MG tablet TAKE 1 TABLET BY MOUTH 2 TIMES DAILY BEFORE MEALS 60 tablet 0   prochlorperazine (COMPAZINE) 5 MG tablet Take 1 tablet (5 mg total) by mouth every 6 (six) hours as needed for nausea or vomiting. 30 tablet 2   SENEXON-S 8.6-50 MG tablet TAKE 1 TABLET BY MOUTH EVERY DAY 30 tablet 6   tamsulosin (FLOMAX) 0.4 MG CAPS  capsule TAKE 1 CAPSULE BY MOUTH EVERY DAY 30 capsule 0   zolpidem (AMBIEN) 5 MG tablet Take 1 tablet (5 mg total) by mouth at bedtime as needed. for sleep 30 tablet 0   No current facility-administered medications for this visit.   Facility-Administered Medications Ordered in Other Visits  Medication Dose Route Frequency Provider Last Rate Last Admin   0.9 %  sodium chloride infusion (Manually program via Guardrails IV Fluids)  250 mL Intravenous Once [name], MD       heparin lock flush 100 unit/mL  500 Units Intracatheter Once [name], MD       sodium chloride flush (NS) 0.9 % injection 10 mL  10 mL Intracatheter Once [name], MD         VITAL SIGNS: There were no vitals taken for this visit. There were no vitals filed for this visit.    Estimated body mass index is 19.6 kg/m as calculated from the following:   Height as of 02/27/23: 5\' 10"  (1.778 m).   Weight as of 04/15/23: 136 lb 9.6 oz (62 kg).   PERFORMANCE STATUS (ECOG) : 2 - Symptomatic, <50% confined to bed  Assessment NAD, thin, in wheelchair  RRR Normal breathing pattern AAO x3  IMPRESSION: Caleb Simmons presents to clinic today for follow-up. Caleb Simmons is present. No acute distress. Is trying to remain as active as possible. Shares he was started on iron and Calcitrol however feels this give him diarrhea. Plans to further discuss with his Nephrology team.   Pain Caleb Simmons reports his pain is well controlled. Some days are better than others. Is taking oxycodone as needed.  Tolerating without difficulty.  Does not take around-the-clock.   We will continue to closely monitor.    Decreased Appetite  Appetite continues to be up and down. Marinol unfortunately is on a nationwide back order. He is taking mirtazapine however this does not increase appetite as much as the Marinol. Encouraged to increase protein intake and focus on small frequent meals.   PLAN: Oxy IR 5-10 mg every 4-6 hours as needed for breakthrough pain Appetite continues to be a challenge. Marinol on backorder. Followed by Dietician.  Xanax 1 mg every 8 hours as needed for anxiety/sleep Miralax twice daily Senna-S daily Zofran as needed for nausea.  I will plan to see him back in 3-4 weeks in collaboration with his other oncology appointments.   Patient expressed understanding and was in agreement with this plan. He also understands that He can call the clinic at any time with any questions, concerns, or complaints.     Any controlled substances utilized were prescribed in the context of palliative care. PDMP has been reviewed.    Visit consisted of counseling and education dealing with  the complex and emotionally intense issues of symptom management and palliative care in the setting of serious and potentially life-threatening illness.Greater than 50%  of this time was spent counseling and coordinating care related to the above assessment and plan.  Willette Alma, AGPCNP-BC  Palliative Medicine Team/[facility]  *Please note that this is a verbal dictation therefore any spelling or grammatical errors are due to the "Dragon Medical One" system interpretation.

## 2023-04-24 ENCOUNTER — Other Ambulatory Visit: Payer: Self-pay

## 2023-04-24 ENCOUNTER — Inpatient Hospital Stay: Payer: Medicare Other | Attending: Hematology

## 2023-04-24 ENCOUNTER — Encounter: Payer: Self-pay | Admitting: Nurse Practitioner

## 2023-04-24 ENCOUNTER — Inpatient Hospital Stay: Payer: Medicare Other

## 2023-04-24 ENCOUNTER — Inpatient Hospital Stay (HOSPITAL_BASED_OUTPATIENT_CLINIC_OR_DEPARTMENT_OTHER): Payer: Medicare Other | Admitting: Nurse Practitioner

## 2023-04-24 VITALS — BP 152/79 | HR 59 | Temp 97.9°F | Resp 14 | Ht 70.0 in | Wt 140.6 lb

## 2023-04-24 DIAGNOSIS — R630 Anorexia: Secondary | ICD-10-CM

## 2023-04-24 DIAGNOSIS — G893 Neoplasm related pain (acute) (chronic): Secondary | ICD-10-CM

## 2023-04-24 DIAGNOSIS — N186 End stage renal disease: Secondary | ICD-10-CM | POA: Diagnosis not present

## 2023-04-24 DIAGNOSIS — E8581 Light chain (AL) amyloidosis: Secondary | ICD-10-CM

## 2023-04-24 DIAGNOSIS — F419 Anxiety disorder, unspecified: Secondary | ICD-10-CM | POA: Diagnosis not present

## 2023-04-24 DIAGNOSIS — C9000 Multiple myeloma not having achieved remission: Secondary | ICD-10-CM | POA: Insufficient documentation

## 2023-04-24 DIAGNOSIS — Z515 Encounter for palliative care: Secondary | ICD-10-CM | POA: Diagnosis not present

## 2023-04-24 DIAGNOSIS — I43 Cardiomyopathy in diseases classified elsewhere: Secondary | ICD-10-CM | POA: Diagnosis not present

## 2023-04-24 DIAGNOSIS — Z5112 Encounter for antineoplastic immunotherapy: Secondary | ICD-10-CM | POA: Diagnosis present

## 2023-04-24 DIAGNOSIS — R634 Abnormal weight loss: Secondary | ICD-10-CM

## 2023-04-24 LAB — CBC WITH DIFFERENTIAL (CANCER CENTER ONLY)
Abs Immature Granulocytes: 0.01 10*3/uL (ref 0.00–0.07)
Basophils Absolute: 0.1 10*3/uL (ref 0.0–0.1)
Basophils Relative: 1 %
Eosinophils Absolute: 0.2 10*3/uL (ref 0.0–0.5)
Eosinophils Relative: 4 %
HCT: 31.1 % — ABNORMAL LOW (ref 39.0–52.0)
Hemoglobin: 10 g/dL — ABNORMAL LOW (ref 13.0–17.0)
Immature Granulocytes: 0 %
Lymphocytes Relative: 35 %
Lymphs Abs: 1.6 10*3/uL (ref 0.7–4.0)
MCH: 31.4 pg (ref 26.0–34.0)
MCHC: 32.2 g/dL (ref 30.0–36.0)
MCV: 97.8 fL (ref 80.0–100.0)
Monocytes Absolute: 0.4 10*3/uL (ref 0.1–1.0)
Monocytes Relative: 8 %
Neutro Abs: 2.5 10*3/uL (ref 1.7–7.7)
Neutrophils Relative %: 52 %
Platelet Count: 104 10*3/uL — ABNORMAL LOW (ref 150–400)
RBC: 3.18 MIL/uL — ABNORMAL LOW (ref 4.22–5.81)
RDW: 16.2 % — ABNORMAL HIGH (ref 11.5–15.5)
WBC Count: 4.7 10*3/uL (ref 4.0–10.5)
nRBC: 0 % (ref 0.0–0.2)

## 2023-04-24 LAB — COMPREHENSIVE METABOLIC PANEL
ALT: 5 U/L (ref 0–44)
AST: 9 U/L — ABNORMAL LOW (ref 15–41)
Albumin: 3.3 g/dL — ABNORMAL LOW (ref 3.5–5.0)
Alkaline Phosphatase: 78 U/L (ref 38–126)
Anion gap: 7 (ref 5–15)
BUN: 31 mg/dL — ABNORMAL HIGH (ref 8–23)
CO2: 26 mmol/L (ref 22–32)
Calcium: 7.8 mg/dL — ABNORMAL LOW (ref 8.9–10.3)
Chloride: 107 mmol/L (ref 98–111)
Creatinine, Ser: 4.79 mg/dL — ABNORMAL HIGH (ref 0.61–1.24)
GFR, Estimated: 12 mL/min — ABNORMAL LOW (ref >60)
Glucose, Bld: 106 mg/dL — ABNORMAL HIGH (ref 70–99)
Potassium: 4.6 mmol/L (ref 3.5–5.1)
Sodium: 140 mmol/L (ref 135–145)
Total Bilirubin: 0.3 mg/dL (ref 0.3–1.2)
Total Protein: 5.3 g/dL — ABNORMAL LOW (ref 6.5–8.1)

## 2023-04-24 MED ORDER — DARATUMUMAB-HYALURONIDASE-FIHJ 1800-30000 MG-UT/15ML ~~LOC~~ SOLN
1800.0000 mg | Freq: Once | SUBCUTANEOUS | Status: AC
  Administered 2023-04-24: 1800 mg via SUBCUTANEOUS
  Filled 2023-04-24: qty 15

## 2023-04-24 MED ORDER — ACETAMINOPHEN 325 MG PO TABS
650.0000 mg | ORAL_TABLET | Freq: Once | ORAL | Status: AC
  Administered 2023-04-24: 650 mg via ORAL
  Filled 2023-04-24: qty 2

## 2023-04-24 MED ORDER — DIPHENHYDRAMINE HCL 25 MG PO CAPS
50.0000 mg | ORAL_CAPSULE | Freq: Once | ORAL | Status: AC
  Administered 2023-04-24: 50 mg via ORAL
  Filled 2023-04-24: qty 2

## 2023-04-24 NOTE — Progress Notes (Signed)
Pt informed this RN that he took PO dexamethasone at home around 0815 this morning.

## 2023-04-24 NOTE — Patient Instructions (Signed)
[facility]  Discharge Instructions: Thank you for choosing [facility] to provide your oncology and hematology care.   If you have a lab appointment with the Cancer Center, please go directly to the Cancer Center and check in at the registration area.   Wear comfortable clothing and clothing appropriate for easy access to any Portacath or PICC line.   We strive to give you quality time with your provider. You may need to reschedule your appointment if you arrive late (15 or more minutes).  Arriving late affects you and other patients whose appointments are after yours.  Also, if you miss three or more appointments without notifying the office, you may be dismissed from the clinic at the provider's discretion.      For prescription refill requests, have your pharmacy contact our office and allow 72 hours for refills to be completed.    Today you received the following chemotherapy and/or immunotherapy agents Darzalex Faspro      To help prevent nausea and vomiting after your treatment, we encourage you to take your nausea medication as directed.  BELOW ARE SYMPTOMS THAT SHOULD BE REPORTED IMMEDIATELY: *FEVER GREATER THAN 100.4 F (38 C) OR HIGHER *CHILLS OR SWEATING *NAUSEA AND VOMITING THAT IS NOT CONTROLLED WITH YOUR NAUSEA MEDICATION *UNUSUAL SHORTNESS OF BREATH *UNUSUAL BRUISING OR BLEEDING *URINARY PROBLEMS (pain or burning when urinating, or frequent urination) *BOWEL PROBLEMS (unusual diarrhea, constipation, pain near the anus) TENDERNESS IN MOUTH AND THROAT WITH OR WITHOUT PRESENCE OF ULCERS (sore throat, sores in mouth, or a toothache) UNUSUAL RASH, SWELLING OR PAIN  UNUSUAL VAGINAL DISCHARGE OR ITCHING   Items with * indicate a potential emergency and should be followed up as soon as possible or go to the Emergency Department if any problems should occur.  Please show the CHEMOTHERAPY ALERT CARD or IMMUNOTHERAPY ALERT CARD at  check-in to the Emergency Department and triage nurse.  Should you have questions after your visit or need to cancel or reschedule your appointment, please contact [facility]  Dept: [phone]  and follow the prompts.  Office hours are 8:00 a.m. to 4:30 p.m. Monday - Friday. Please note that voicemails left after 4:00 p.m. may not be returned until the following business day.  We are closed weekends and major holidays. You have access to a nurse at all times for urgent questions. Please call the main number to the clinic Dept: [phone] and follow the prompts.   For any non-urgent questions, you may also contact your provider using MyChart. We now offer e-Visits for anyone 18 and older to request care online for non-urgent symptoms. For details visit [email or web address].   Also download the MyChart app! Go to the app store, search "MyChart", open the app, select [place], and log in with your MyChart username and password.  

## 2023-04-25 ENCOUNTER — Other Ambulatory Visit (HOSPITAL_COMMUNITY): Payer: Self-pay | Admitting: Cardiology

## 2023-04-28 ENCOUNTER — Other Ambulatory Visit: Payer: Self-pay | Admitting: Nurse Practitioner

## 2023-05-01 ENCOUNTER — Encounter: Payer: Self-pay | Admitting: *Deleted

## 2023-05-07 ENCOUNTER — Other Ambulatory Visit: Payer: Self-pay | Admitting: Nurse Practitioner

## 2023-05-07 DIAGNOSIS — G893 Neoplasm related pain (acute) (chronic): Secondary | ICD-10-CM

## 2023-05-07 DIAGNOSIS — Z515 Encounter for palliative care: Secondary | ICD-10-CM

## 2023-05-07 DIAGNOSIS — E854 Organ-limited amyloidosis: Secondary | ICD-10-CM

## 2023-05-07 DIAGNOSIS — G4700 Insomnia, unspecified: Secondary | ICD-10-CM

## 2023-05-07 MED ORDER — OXYCODONE HCL 15 MG PO TABS
15.0000 mg | ORAL_TABLET | Freq: Four times a day (QID) | ORAL | 0 refills | Status: DC | PRN
Start: 2023-05-07 — End: 2023-05-21

## 2023-05-07 MED ORDER — ZOLPIDEM TARTRATE 5 MG PO TABS
5.0000 mg | ORAL_TABLET | Freq: Every evening | ORAL | 0 refills | Status: DC | PRN
Start: 2023-05-07 — End: 2023-07-11

## 2023-05-07 NOTE — Telephone Encounter (Signed)
From: Caleb Simmons To: [name], NP Sent: 05/07/2023 8:32 AM EDT Subject: Medication Renewal Request  Refills have been requested for the following medications:   zolpidem (AMBIEN) 5 MG tablet [[name]]   oxyCODONE (ROXICODONE) 15 MG immediate release tablet [name]  Preferred pharmacy: [facility] - [place], [place] - [address] Delivery method: Pickup  This message is being sent by Caleb Simmons on behalf of Caleb Simmons

## 2023-05-09 ENCOUNTER — Other Ambulatory Visit: Payer: Self-pay | Admitting: Nurse Practitioner

## 2023-05-09 ENCOUNTER — Other Ambulatory Visit: Payer: Self-pay | Admitting: Hematology

## 2023-05-15 ENCOUNTER — Other Ambulatory Visit: Payer: Self-pay | Admitting: Nurse Practitioner

## 2023-05-15 DIAGNOSIS — Z515 Encounter for palliative care: Secondary | ICD-10-CM

## 2023-05-15 DIAGNOSIS — E854 Organ-limited amyloidosis: Secondary | ICD-10-CM

## 2023-05-15 DIAGNOSIS — F419 Anxiety disorder, unspecified: Secondary | ICD-10-CM

## 2023-05-15 MED ORDER — ALPRAZOLAM 1 MG PO TABS
1.0000 mg | ORAL_TABLET | Freq: Three times a day (TID) | ORAL | 0 refills | Status: DC | PRN
Start: 2023-05-15 — End: 2023-06-19

## 2023-05-15 NOTE — Telephone Encounter (Signed)
From: Caleb Simmons To: [name], NP Sent: 05/15/2023 10:23 AM EDT Subject: Medication Renewal Request  Refills have been requested for the following medications:   ALPRAZolam Prudy Feeler) 1 MG tablet [name]  Preferred pharmacy: [facility] - [place], [place] - [address] Delivery method: Pickup  This message is being sent by Caleb Simmons on behalf of Caleb Simmons

## 2023-05-20 ENCOUNTER — Other Ambulatory Visit: Payer: Self-pay | Admitting: Hematology

## 2023-05-21 ENCOUNTER — Other Ambulatory Visit: Payer: Self-pay | Admitting: Nurse Practitioner

## 2023-05-21 DIAGNOSIS — E854 Organ-limited amyloidosis: Secondary | ICD-10-CM

## 2023-05-21 DIAGNOSIS — Z515 Encounter for palliative care: Secondary | ICD-10-CM

## 2023-05-21 DIAGNOSIS — G893 Neoplasm related pain (acute) (chronic): Secondary | ICD-10-CM

## 2023-05-21 MED ORDER — OXYCODONE HCL 15 MG PO TABS
15.0000 mg | ORAL_TABLET | Freq: Four times a day (QID) | ORAL | 0 refills | Status: DC | PRN
Start: 2023-05-21 — End: 2023-06-06

## 2023-05-21 NOTE — Telephone Encounter (Signed)
From: Caleb Simmons To: [name], NP Sent: 05/21/2023 11:01 AM EDT Subject: Medication Renewal Request  Refills have been requested for the following medications:   oxyCODONE (ROXICODONE) 15 MG immediate release tablet [name]  Preferred pharmacy: [facility] - [place], [place] - [address] Delivery method: Pickup  This message is being sent by Caleb Simmons on behalf of Caleb Simmons

## 2023-05-22 ENCOUNTER — Inpatient Hospital Stay: Payer: Commercial Managed Care - PPO

## 2023-05-22 ENCOUNTER — Inpatient Hospital Stay (HOSPITAL_BASED_OUTPATIENT_CLINIC_OR_DEPARTMENT_OTHER): Payer: Medicare Other | Admitting: Hematology

## 2023-05-22 ENCOUNTER — Other Ambulatory Visit: Payer: Self-pay

## 2023-05-22 ENCOUNTER — Encounter: Payer: Self-pay | Admitting: Nurse Practitioner

## 2023-05-22 ENCOUNTER — Encounter: Payer: Self-pay | Admitting: Hematology

## 2023-05-22 ENCOUNTER — Inpatient Hospital Stay (HOSPITAL_BASED_OUTPATIENT_CLINIC_OR_DEPARTMENT_OTHER): Payer: Medicare Other | Admitting: Nurse Practitioner

## 2023-05-22 VITALS — BP 145/85 | HR 68 | Temp 97.7°F | Resp 14 | Ht 70.0 in | Wt 138.5 lb

## 2023-05-22 DIAGNOSIS — R630 Anorexia: Secondary | ICD-10-CM | POA: Diagnosis not present

## 2023-05-22 DIAGNOSIS — R530 Neoplastic (malignant) related fatigue: Secondary | ICD-10-CM

## 2023-05-22 DIAGNOSIS — N186 End stage renal disease: Secondary | ICD-10-CM

## 2023-05-22 DIAGNOSIS — Z5112 Encounter for antineoplastic immunotherapy: Secondary | ICD-10-CM | POA: Diagnosis not present

## 2023-05-22 DIAGNOSIS — R634 Abnormal weight loss: Secondary | ICD-10-CM | POA: Diagnosis not present

## 2023-05-22 DIAGNOSIS — E854 Organ-limited amyloidosis: Secondary | ICD-10-CM | POA: Diagnosis not present

## 2023-05-22 DIAGNOSIS — E8581 Light chain (AL) amyloidosis: Secondary | ICD-10-CM

## 2023-05-22 DIAGNOSIS — G893 Neoplasm related pain (acute) (chronic): Secondary | ICD-10-CM

## 2023-05-22 DIAGNOSIS — Z515 Encounter for palliative care: Secondary | ICD-10-CM

## 2023-05-22 DIAGNOSIS — I43 Cardiomyopathy in diseases classified elsewhere: Secondary | ICD-10-CM

## 2023-05-22 LAB — CBC WITH DIFFERENTIAL/PLATELET
Abs Immature Granulocytes: 0.02 10*3/uL (ref 0.00–0.07)
Basophils Absolute: 0.1 10*3/uL (ref 0.0–0.1)
Basophils Relative: 1 %
Eosinophils Absolute: 0.2 10*3/uL (ref 0.0–0.5)
Eosinophils Relative: 3 %
HCT: 30.2 % — ABNORMAL LOW (ref 39.0–52.0)
Hemoglobin: 9.8 g/dL — ABNORMAL LOW (ref 13.0–17.0)
Immature Granulocytes: 0 %
Lymphocytes Relative: 25 %
Lymphs Abs: 1.6 10*3/uL (ref 0.7–4.0)
MCH: 31 pg (ref 26.0–34.0)
MCHC: 32.5 g/dL (ref 30.0–36.0)
MCV: 95.6 fL (ref 80.0–100.0)
Monocytes Absolute: 0.4 10*3/uL (ref 0.1–1.0)
Monocytes Relative: 7 %
Neutro Abs: 4.1 10*3/uL (ref 1.7–7.7)
Neutrophils Relative %: 64 %
Platelets: 116 10*3/uL — ABNORMAL LOW (ref 150–400)
RBC: 3.16 MIL/uL — ABNORMAL LOW (ref 4.22–5.81)
RDW: 14.9 % (ref 11.5–15.5)
WBC: 6.4 10*3/uL (ref 4.0–10.5)
nRBC: 0 % (ref 0.0–0.2)

## 2023-05-22 LAB — COMPREHENSIVE METABOLIC PANEL
ALT: 7 U/L (ref 0–44)
AST: 10 U/L — ABNORMAL LOW (ref 15–41)
Albumin: 3.5 g/dL (ref 3.5–5.0)
Alkaline Phosphatase: 84 U/L (ref 38–126)
Anion gap: 8 (ref 5–15)
BUN: 42 mg/dL — ABNORMAL HIGH (ref 8–23)
CO2: 26 mmol/L (ref 22–32)
Calcium: 8.1 mg/dL — ABNORMAL LOW (ref 8.9–10.3)
Chloride: 103 mmol/L (ref 98–111)
Creatinine, Ser: 5.16 mg/dL — ABNORMAL HIGH (ref 0.61–1.24)
GFR, Estimated: 11 mL/min — ABNORMAL LOW (ref >60)
Glucose, Bld: 116 mg/dL — ABNORMAL HIGH (ref 70–99)
Potassium: 4.6 mmol/L (ref 3.5–5.1)
Sodium: 137 mmol/L (ref 135–145)
Total Bilirubin: 0.3 mg/dL (ref 0.3–1.2)
Total Protein: 5.8 g/dL — ABNORMAL LOW (ref 6.5–8.1)

## 2023-05-22 MED ORDER — PROCHLORPERAZINE MALEATE 5 MG PO TABS
5.0000 mg | ORAL_TABLET | Freq: Four times a day (QID) | ORAL | 2 refills | Status: DC | PRN
Start: 2023-05-22 — End: 2023-09-08

## 2023-05-22 MED ORDER — DARATUMUMAB-HYALURONIDASE-FIHJ 1800-30000 MG-UT/15ML ~~LOC~~ SOLN
1800.0000 mg | Freq: Once | SUBCUTANEOUS | Status: AC
  Administered 2023-05-22: 1800 mg via SUBCUTANEOUS
  Filled 2023-05-22: qty 15

## 2023-05-22 MED ORDER — DIPHENHYDRAMINE HCL 25 MG PO CAPS
50.0000 mg | ORAL_CAPSULE | Freq: Once | ORAL | Status: AC
  Administered 2023-05-22: 50 mg via ORAL
  Filled 2023-05-22: qty 2

## 2023-05-22 MED ORDER — ACETAMINOPHEN 325 MG PO TABS
650.0000 mg | ORAL_TABLET | Freq: Once | ORAL | Status: AC
  Administered 2023-05-22: 650 mg via ORAL
  Filled 2023-05-22: qty 2

## 2023-05-22 NOTE — Progress Notes (Signed)
Palliative Medicine [facility]  Telephone:([phone] Fax:(336) [phone]   Name: Caleb Simmons Date: 05/22/2023 MRN: [id number]  DOB: Apr 24, 1951  Patient Care Team: [name], MD as PCP - General (Hematology) Buntin, Daisy Floro, RN (Inactive) as Registered Nurse Pickenpack-Cousar, Arty Baumgartner, NP as Nurse Practitioner (Nurse Practitioner) [name], MD as Attending Physician (Hematology and Oncology)    INTERVAL HISTORY: Caleb Simmons is a 72 y.o. 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  Recently admitted and discharged on 01/20/22 after receiving treatment for left MCA CVA. Palliative following for ongoing goals of care discussions and symptom management.   SOCIAL HISTORY:     reports that he has been smoking cigarettes. He has a 6.25 pack-year smoking history. He has never used smokeless tobacco.  ADVANCE DIRECTIVES:  Patient reports completed document.  Son Dagoberto, Mccorkel. is his healthcare power of attorney.  CODE STATUS: Full code  PAST MEDICAL HISTORY: Past Medical History:  Diagnosis Date   Cancer (HCC)     ALLERGIES:  has No Known Allergies.  MEDICATIONS:  Current Outpatient Medications  Medication Sig Dispense Refill   acetaminophen (TYLENOL) 500 MG tablet Take 1,000 mg by mouth every 6 (six) hours as needed for moderate pain.     ALPRAZolam (XANAX) 1 MG tablet Take 1 tablet (1 mg total) by mouth every 8 (eight) hours as needed for anxiety or sleep. 90 tablet 0   aspirin 81 MG EC tablet Take 1 tablet (81 mg total) by mouth daily. Swallow whole. 30 tablet 11   cetirizine (ZYRTEC) 10 MG tablet Take 10 mg by mouth daily.     dexamethasone (DECADRON) 4 MG tablet Take 5 tablets (20 mg total) by mouth once a week. Take 1-2 hour before chemo injection 30 tablet 1   diclofenac Sodium (VOLTAREN) 1 % GEL Apply 1 Application  topically 4 (four) times daily as needed (pain).     dronabinol (MARINOL) 2.5 MG capsule Take 1 capsule (2.5 mg total) by mouth daily in the afternoon. 30 capsule 0   ezetimibe (ZETIA) 10 MG tablet Take 1 tablet (10 mg total) by mouth daily. 90 tablet 3   folic acid (FOLVITE) 1 MG tablet TAKE 2 TABLETS BY MOUTH EVERY DAY 60 tablet 0   Melatonin 10 MG TABS Take 10 mg by mouth at bedtime.     metoprolol succinate (TOPROL-XL) 25 MG 24 hr tablet TAKE 1/2 TABLET BY MOUTH EVERY DAY 15 tablet 4   midodrine (PROAMATINE) 5 MG tablet TAKE 1 TABLET BY MOUTH 3 TIMES DAILY WITH MEALS 270 tablet 3   mirtazapine (REMERON) 30 MG tablet TAKE 1 TABLET BY MOUTH AT BEDTIME 30 tablet 1   ondansetron (ZOFRAN) 4 MG tablet TAKE 1 TABLET BY MOUTH EVERY 8 HOURS AS NEEDED FOR NAUSEA AND VOMITING 30 tablet 2   oxyCODONE (ROXICODONE) 15 MG immediate release tablet Take 1 tablet (15 mg total) by mouth every 6 (six) hours as needed. 60 tablet 0   pantoprazole (PROTONIX) 40 MG tablet TAKE 1 TABLET BY MOUTH 2 TIMES DAILY BEFORE MEALS 60 tablet 0   prochlorperazine (COMPAZINE) 5 MG tablet Take 1 tablet (5 mg total) by mouth every 6 (six) hours as needed for nausea or vomiting. 30 tablet 2   STIMULANT LAXATIVE 8.6-50 MG tablet TAKE 1 TABLET BY MOUTH EVERY DAY 30 tablet 6   tamsulosin (FLOMAX) 0.4 MG  CAPS capsule TAKE 1 CAPSULE BY MOUTH EVERY DAY 30 capsule 0   zolpidem (AMBIEN) 5 MG tablet Take 1 tablet (5 mg total) by mouth at bedtime as needed. for sleep 30 tablet 0   No current facility-administered medications for this visit.   Facility-Administered Medications Ordered in Other Visits  Medication Dose Route Frequency Provider Last Rate Last Admin   0.9 %  sodium chloride infusion (Manually program via Guardrails IV Fluids)  250 mL Intravenous Once [name], MD       heparin lock flush 100 unit/mL  500 Units Intracatheter Once [name], MD       sodium chloride flush (NS) 0.9 % injection 10 mL  10 mL Intracatheter Once  [name], MD        VITAL SIGNS: There were no vitals taken for this visit. There were no vitals filed for this visit.    Estimated body mass index is 20.17 kg/m as calculated from the following:   Height as of 04/24/23: 5\' 10"  (1.778 m).   Weight as of 04/24/23: 140 lb 9.6 oz (63.8 kg).   PERFORMANCE STATUS (ECOG) : 2 - Symptomatic, <50% confined to bed  Assessment NAD RRR Normal breathing pattern AAO x3  IMPRESSION: I saw Mr. [name] during infusion. No acute distress. [name] is present. Patient resting comfortably. States he is doing well overall. Continues on dialysis. Denies nausea, vomiting, constipation, or diarrhea.   Pain Herberth reports his pain is well controlled. Some days are better than others. Is taking oxycodone as needed.  Tolerating without difficulty.  Does not take around-the-clock.   We will continue to closely monitor.    Decreased Appetite Appetite is slowly improving. Marinol continues to be on backorder. Felt this helped his appetite the most. Is taking mirtazapine at bedtime. Current weight is 1387lbs down from 140lbs on 5/2.   PLAN: Oxy IR 5-10 mg every 4-6 hours as needed for breakthrough pain Appetite continues to be a challenge. Marinol on backorder. Followed by Dietician.  Xanax 1 mg every 8 hours as needed for anxiety/sleep Miralax twice daily Senna-S daily Zofran as needed for nausea.  I will plan to see him back in 3-4 weeks in collaboration with his other oncology appointments.   Patient expressed understanding and was in agreement with this plan. He also understands that He can call the clinic at any time with any questions, concerns, or complaints.     Any controlled substances utilized were prescribed in the context of palliative care. PDMP has been reviewed.    Visit consisted of counseling and education dealing with the complex and emotionally intense issues of symptom management and palliative care in the setting of serious and  potentially life-threatening illness.Greater than 50%  of this time was spent counseling and coordinating care related to the above assessment and plan.  Willette Alma, AGPCNP-BC  Palliative Medicine Team/[facility]  *Please note that this is a verbal dictation therefore any spelling or grammatical errors are due to the "Dragon Medical One" system interpretation.

## 2023-05-22 NOTE — Patient Instructions (Signed)
[facility]  Discharge Instructions: Thank you for choosing [facility] to provide your oncology and hematology care.   If you have a lab appointment with the Cancer Center, please go directly to the Cancer Center and check in at the registration area.   Wear comfortable clothing and clothing appropriate for easy access to any Portacath or PICC line.   We strive to give you quality time with your provider. You may need to reschedule your appointment if you arrive late (15 or more minutes).  Arriving late affects you and other patients whose appointments are after yours.  Also, if you miss three or more appointments without notifying the office, you may be dismissed from the clinic at the provider's discretion.      For prescription refill requests, have your pharmacy contact our office and allow 72 hours for refills to be completed.    Today you received the following chemotherapy and/or immunotherapy agents Darzalex Faspro      To help prevent nausea and vomiting after your treatment, we encourage you to take your nausea medication as directed.  BELOW ARE SYMPTOMS THAT SHOULD BE REPORTED IMMEDIATELY: *FEVER GREATER THAN 100.4 F (38 C) OR HIGHER *CHILLS OR SWEATING *NAUSEA AND VOMITING THAT IS NOT CONTROLLED WITH YOUR NAUSEA MEDICATION *UNUSUAL SHORTNESS OF BREATH *UNUSUAL BRUISING OR BLEEDING *URINARY PROBLEMS (pain or burning when urinating, or frequent urination) *BOWEL PROBLEMS (unusual diarrhea, constipation, pain near the anus) TENDERNESS IN MOUTH AND THROAT WITH OR WITHOUT PRESENCE OF ULCERS (sore throat, sores in mouth, or a toothache) UNUSUAL RASH, SWELLING OR PAIN  UNUSUAL VAGINAL DISCHARGE OR ITCHING   Items with * indicate a potential emergency and should be followed up as soon as possible or go to the Emergency Department if any problems should occur.  Please show the CHEMOTHERAPY ALERT CARD or IMMUNOTHERAPY ALERT CARD at  check-in to the Emergency Department and triage nurse.  Should you have questions after your visit or need to cancel or reschedule your appointment, please contact [facility]  Dept: [phone]  and follow the prompts.  Office hours are 8:00 a.m. to 4:30 p.m. Monday - Friday. Please note that voicemails left after 4:00 p.m. may not be returned until the following business day.  We are closed weekends and major holidays. You have access to a nurse at all times for urgent questions. Please call the main number to the clinic Dept: [phone] and follow the prompts.   For any non-urgent questions, you may also contact your provider using MyChart. We now offer e-Visits for anyone 18 and older to request care online for non-urgent symptoms. For details visit [email or web address].   Also download the MyChart app! Go to the app store, search "MyChart", open the app, select , and log in with your MyChart username and password.  

## 2023-05-22 NOTE — Assessment & Plan Note (Signed)
-   fu with cardiology

## 2023-05-22 NOTE — Assessment & Plan Note (Signed)
-  lamda light chain disease with renal, cardiac and neuro involvement   -diagnosed in 08/2021 -not a candidate for transplant  --He began weekly oral Cytoxan, dexa and Velcade injection while inpatient on 09/10/21.  He tolerated well. -He began daratumumab injection 09/27/21. Chemo was held briefly following stroke 01/14/22. -not a candidate for biphosphonate due to renal failure -he completed 6 cycle induction chemo with CyBorD on 03/21/22 and moved to monthly Dara maintenance on 03/28/22. Plan to continue for a total of 2 years (24 cycles). -He has had good response to treatment based on his M protein and light chain level.

## 2023-05-22 NOTE — Progress Notes (Signed)
RN confirmed patient took dexamethasone at home.  Pryor Ochoa, PharmD

## 2023-05-22 NOTE — Progress Notes (Signed)
[facility]   Telephone:([phone] Fax:(336) [phone]   Clinic Follow up Note   Patient Care Team: [name], MD as PCP - General (Hematology) Buntin, Daisy Floro, RN (Inactive) as Registered Nurse Pickenpack-Cousar, Arty Baumgartner, NP as Nurse Practitioner (Nurse Practitioner) [name], MD as Attending Physician (Hematology and Oncology)  Date of Service:  05/22/2023  CHIEF COMPLAINT: f/u of multiorgan AL amyloidosis    CURRENT THERAPY:   Myeloma Daratumumab SQ q28d   ASSESSMENT:  Caleb Simmons is a 72 y.o. 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  He tolerated well. -He began daratumumab injection 09/27/21. Chemo was held briefly following stroke 01/14/22. -not a candidate for biphosphonate due to renal failure -he completed 6 cycle induction chemo with CyBorD on 03/21/22 and moved to monthly Dara maintenance on 03/28/22. Plan to continue for a total of 2 years (24 cycles). -He has had good response to treatment based on his M protein and light chain level.  ESRD (end stage renal disease) (HCC) -on HD MF     Cardiac amyloidosis (HCC) -f/u with cardiology   Persistent fatigue and low appetite  -Multifactorial, related to his end-stage renal disease, amyloidosis and chemo treatment -He is on Marinol and mirtazapine for appetite -I encouraged him to snack and increase activity as he tolerates  -Continue to monitor closely.   PLAN: - lab reviewed - light chain -stable -continue maintenance monthly Dara Injection for two years -lab and f/u in 3 months   INTERVAL HISTORY:  Caleb Simmons is here for a follow up of multiorgan AL amyloidosis . He was last seen by me on 02/27/2023. He presents to the clinic accompanied by wife. Pt state that he is doing well. He takes  dialysis twice a week and he states that is going well. Pt is able to get out and shop and do some yard work. Pt state that he have back pain before diagnoses. He take Oxycodone for pain. Pt does not have any skin break down.     All other systems were reviewed with the patient and are negative.  MEDICAL HISTORY:  Past Medical History:  Diagnosis Date   Cancer [facility])     SURGICAL HISTORY: Past Surgical History:  Procedure Laterality Date   APPENDECTOMY     IR ANGIO INTRA EXTRACRAN SEL COM CAROTID INNOMINATE BILAT MOD SED  01/16/2022   IR ANGIO INTRA EXTRACRAN SEL COM CAROTID INNOMINATE BILAT MOD SED  07/16/2022   IR ANGIO VERTEBRAL SEL SUBCLAVIAN INNOMINATE BILAT MOD SED  01/16/2022   IR ANGIO VERTEBRAL SEL SUBCLAVIAN INNOMINATE UNI R MOD SED  07/16/2022   IR ANGIO VERTEBRAL SEL VERTEBRAL UNI L MOD SED  07/16/2022   IR CT HEAD LTD  01/18/2022   IR EMBO ART  VEN HEMORR LYMPH EXTRAV  INC GUIDE ROADMAPPING  08/30/2021   IR FLUORO GUIDE CV LINE RIGHT  08/30/2021   IR FLUORO GUIDE CV LINE RIGHT  09/18/2021   IR FLUORO GUIDE CV LINE RIGHT  05/02/2022   IR PERCUTANEOUS ART THROMBECTOMY/INFUSION INTRACRANIAL INC DIAG ANGIO  01/18/2022   IR RADIOLOGIST EVAL & MGMT  08/02/2022   IR RENAL SELECTIVE  UNI INC S&I MOD SED  08/31/2021   IR US GUIDE VASC ACCESS RIGHT  08/30/2021   IR US GUIDE  VASC ACCESS RIGHT  08/30/2021   IR US GUIDE VASC ACCESS RIGHT  09/18/2021   IR US GUIDE VASC ACCESS RIGHT  01/16/2022   IR US GUIDE VASC ACCESS RIGHT  07/16/2022   IR VENOCAVAGRAM SVC  05/02/2022   RADIOLOGY WITH ANESTHESIA N/A 01/18/2022   Procedure: Cerebral angioplasty with possible stenting;  Surgeon: Julieanne Cotton, MD;  Location: [facility] OR;  Service: Radiology;  Laterality: N/A;    I have reviewed the social history and family history with the patient and they are unchanged from previous note.  ALLERGIES:  has No Known Allergies.  MEDICATIONS:  Current Outpatient Medications  Medication Sig Dispense Refill    acetaminophen (TYLENOL) 500 MG tablet Take 1,000 mg by mouth every 6 (six) hours as needed for moderate pain.     ALPRAZolam (XANAX) 1 MG tablet Take 1 tablet (1 mg total) by mouth every 8 (eight) hours as needed for anxiety or sleep. 90 tablet 0   aspirin 81 MG EC tablet Take 1 tablet (81 mg total) by mouth daily. Swallow whole. 30 tablet 11   cetirizine (ZYRTEC) 10 MG tablet Take 10 mg by mouth daily.     dexamethasone (DECADRON) 4 MG tablet Take 5 tablets (20 mg total) by mouth once a week. Take 1-2 hour before chemo injection 30 tablet 1   diclofenac Sodium (VOLTAREN) 1 % GEL Apply 1 Application topically 4 (four) times daily as needed (pain).     dronabinol (MARINOL) 2.5 MG capsule Take 1 capsule (2.5 mg total) by mouth daily in the afternoon. 30 capsule 0   ezetimibe (ZETIA) 10 MG tablet Take 1 tablet (10 mg total) by mouth daily. 90 tablet 3   folic acid (FOLVITE) 1 MG tablet TAKE 2 TABLETS BY MOUTH EVERY DAY 60 tablet 0   Melatonin 10 MG TABS Take 10 mg by mouth at bedtime.     metoprolol succinate (TOPROL-XL) 25 MG 24 hr tablet TAKE 1/2 TABLET BY MOUTH EVERY DAY 15 tablet 4   midodrine (PROAMATINE) 5 MG tablet TAKE 1 TABLET BY MOUTH 3 TIMES DAILY WITH MEALS 270 tablet 3   mirtazapine (REMERON) 30 MG tablet TAKE 1 TABLET BY MOUTH AT BEDTIME 30 tablet 1   ondansetron (ZOFRAN) 4 MG tablet TAKE 1 TABLET BY MOUTH EVERY 8 HOURS AS NEEDED FOR NAUSEA AND VOMITING 30 tablet 2   oxyCODONE (ROXICODONE) 15 MG immediate release tablet Take 1 tablet (15 mg total) by mouth every 6 (six) hours as needed. 60 tablet 0   pantoprazole (PROTONIX) 40 MG tablet TAKE 1 TABLET BY MOUTH 2 TIMES DAILY BEFORE MEALS 60 tablet 0   prochlorperazine (COMPAZINE) 5 MG tablet Take 1 tablet (5 mg total) by mouth every 6 (six) hours as needed for nausea or vomiting. 30 tablet 2   STIMULANT LAXATIVE 8.6-50 MG tablet TAKE 1 TABLET BY MOUTH EVERY DAY 30 tablet 6   tamsulosin (FLOMAX) 0.4 MG CAPS capsule TAKE 1 CAPSULE BY MOUTH  EVERY DAY 30 capsule 0   zolpidem (AMBIEN) 5 MG tablet Take 1 tablet (5 mg total) by mouth at bedtime as needed. for sleep 30 tablet 0   No current facility-administered medications for this visit.   Facility-Administered Medications Ordered in Other Visits  Medication Dose Route Frequency Provider Last Rate Last Admin   0.9 %  sodium chloride infusion (Manually program via Guardrails IV Fluids)  250 mL Intravenous Once [name], MD       daratumumab-hyaluronidase-fihj [facility] FASPRO) 1800-30000 MG-UT/15ML chemo SQ injection  1,800 mg  1,800 mg Subcutaneous Once [name], MD       heparin lock flush 100 unit/mL  500 Units Intracatheter Once [name], MD       sodium chloride flush (NS) 0.9 % injection 10 mL  10 mL Intracatheter Once [name], MD        PHYSICAL EXAMINATION: ECOG PERFORMANCE STATUS: 2 - Symptomatic, <50% confined to bed  Vitals:   05/22/23 0846  BP: (!) 145/85  Pulse: 68  Resp: 14  Temp: 97.7 F (36.5 C)  SpO2: 97%   Wt Readings from Last 3 Encounters:  05/22/23 138 lb 8 oz (62.8 kg)  04/24/23 140 lb 9.6 oz (63.8 kg)  04/15/23 136 lb 9.6 oz (62 kg)     GENERAL:alert, no distress and comfortable SKIN: skin color normal, no rashes or significant lesions EYES: normal, Conjunctiva are pink and non-injected, sclera clear  NEURO: alert & oriented x 3 with fluent speech LUNGS: (-) clear to auscultation and percussion with normal breathing effort HEART: (-) regular rate & rhythm and no murmurs and (-)no lower extremity edema   LABORATORY DATA:  I have reviewed the data as listed    Latest Ref Rng & Units 05/22/2023    7:57 AM 04/24/2023    8:21 AM 03/27/2023    8:40 AM  CBC  WBC 4.0 - 10.5 K/uL 6.4  4.7  6.2   Hemoglobin 13.0 - 17.0 g/dL 9.8  69.6  29.5   Hematocrit 39.0 - 52.0 % 30.2  31.1  36.5   Platelets 150 - 400 K/uL 116  104  155         Latest Ref Rng & Units 05/22/2023    7:57 AM 04/24/2023    8:21 AM 03/27/2023    8:40 AM  CMP  Glucose 70 - 99  mg/dL 284  132  440   BUN 8 - 23 mg/dL 42  31  34   Creatinine 0.61 - 1.24 mg/dL 1.02  7.25  3.66   Sodium 135 - 145 mmol/L 137  140  137   Potassium 3.5 - 5.1 mmol/L 4.6  4.6  4.4   Chloride 98 - 111 mmol/L 103  107  104   CO2 22 - 32 mmol/L 26  26  23    Calcium 8.9 - 10.3 mg/dL 8.1  7.8  9.2   Total Protein 6.5 - 8.1 g/dL 5.8  5.3  6.0   Total Bilirubin 0.3 - 1.2 mg/dL 0.3  0.3  0.5   Alkaline Phos 38 - 126 U/L 84  78  101   AST 15 - 41 U/L 10  9  7    ALT 0 - 44 U/L 7  5  5        RADIOGRAPHIC STUDIES: I have personally reviewed the radiological images as listed and agreed with the findings in the report. No results found.    Orders Placed This Encounter  Procedures   CBC with Differential (Cancer Center Only)    Standing Status:   Future    Standing Expiration Date:   06/18/2024   CBC with Differential (Cancer Center Only)    Standing Status:   Future    Standing Expiration Date:   07/16/2024   CBC with Differential (Cancer Center Only)    Standing Status:   Future    Standing Expiration Date:   08/13/2024   CBC with Differential (Cancer Center Only)    Standing Status:   Future  Standing Expiration Date:   09/10/2024   CBC with Differential (Cancer Center Only)    Standing Status:   Future    Standing Expiration Date:   10/08/2024   All questions were answered. The patient knows to call the clinic with any problems, questions or concerns. No barriers to learning was detected. The total time spent in the appointment was 25 minutes.     [name], MD 05/22/2023   Carolin Coy, CMA, am acting as scribe for [name], MD.   I have reviewed the above documentation for accuracy and completeness, and I agree with the above.

## 2023-05-22 NOTE — Assessment & Plan Note (Signed)
-  on HD MF 

## 2023-06-03 ENCOUNTER — Other Ambulatory Visit: Payer: Self-pay | Admitting: Hematology

## 2023-06-06 ENCOUNTER — Other Ambulatory Visit: Payer: Self-pay

## 2023-06-06 DIAGNOSIS — E854 Organ-limited amyloidosis: Secondary | ICD-10-CM

## 2023-06-06 DIAGNOSIS — Z515 Encounter for palliative care: Secondary | ICD-10-CM

## 2023-06-06 DIAGNOSIS — G893 Neoplasm related pain (acute) (chronic): Secondary | ICD-10-CM

## 2023-06-06 MED ORDER — OXYCODONE HCL 15 MG PO TABS
15.0000 mg | ORAL_TABLET | Freq: Four times a day (QID) | ORAL | 0 refills | Status: DC | PRN
Start: 2023-06-06 — End: 2023-06-19

## 2023-06-10 ENCOUNTER — Encounter: Payer: Self-pay | Admitting: Hematology

## 2023-06-13 NOTE — Progress Notes (Unsigned)
Palliative Medicine [facility]  Telephone:(336) [phone] Fax:(336) [phone]   Name: Caleb Simmons Date: 06/13/2023 MRN: [id number]  DOB: [birth year]  Patient Care Team: [name], MD as PCP - General (Hematology) Buntin, Daisy Floro, RN (Inactive) as Registered Nurse Pickenpack-Cousar, Arty Baumgartner, NP as Nurse Practitioner (Nurse Practitioner) [name], MD as Attending Physician (Hematology and Oncology)    INTERVAL HISTORY: Caleb Simmons is a 72 y.o. 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  Recently admitted and discharged on 01/20/22 after receiving treatment for left MCA CVA. Palliative following for ongoing goals of care discussions and symptom management.   SOCIAL HISTORY:     reports that he has been smoking cigarettes. He has a 6.25 pack-year smoking history. He has never used smokeless tobacco.  ADVANCE DIRECTIVES:  Patient reports completed document.  Son Caleb, Simmons. is his healthcare power of attorney.  CODE STATUS: Full code  PAST MEDICAL HISTORY: Past Medical History:  Diagnosis Date   Cancer (HCC)     ALLERGIES:  has No Known Allergies.  MEDICATIONS:  Current Outpatient Medications  Medication Sig Dispense Refill   acetaminophen (TYLENOL) 500 MG tablet Take 1,000 mg by mouth every 6 (six) hours as needed for moderate pain.     ALPRAZolam (XANAX) 1 MG tablet Take 1 tablet (1 mg total) by mouth every 8 (eight) hours as needed for anxiety or sleep. 90 tablet 0   aspirin 81 MG EC tablet Take 1 tablet (81 mg total) by mouth daily. Swallow whole. 30 tablet 11   cetirizine (ZYRTEC) 10 MG tablet Take 10 mg by mouth daily.     dexamethasone (DECADRON) 4 MG tablet Take 5 tablets (20 mg total) by mouth once a week. Take 1-2 hour before chemo injection 30 tablet 1   diclofenac Sodium (VOLTAREN) 1 % GEL Apply 1 Application  topically 4 (four) times daily as needed (pain).     dronabinol (MARINOL) 2.5 MG capsule Take 1 capsule (2.5 mg total) by mouth daily in the afternoon. 30 capsule 0   ezetimibe (ZETIA) 10 MG tablet Take 1 tablet (10 mg total) by mouth daily. 90 tablet 3   folic acid (FOLVITE) 1 MG tablet TAKE 2 TABLETS BY MOUTH EVERY DAY 60 tablet 0   Melatonin 10 MG TABS Take 10 mg by mouth at bedtime.     metoprolol succinate (TOPROL-XL) 25 MG 24 hr tablet TAKE 1/2 TABLET BY MOUTH EVERY DAY 15 tablet 4   midodrine (PROAMATINE) 5 MG tablet TAKE 1 TABLET BY MOUTH 3 TIMES DAILY WITH MEALS 270 tablet 3   mirtazapine (REMERON) 30 MG tablet TAKE 1 TABLET BY MOUTH AT BEDTIME 30 tablet 1   ondansetron (ZOFRAN) 4 MG tablet TAKE 1 TABLET BY MOUTH EVERY 8 HOURS AS NEEDED FOR NAUSEA AND VOMITING 30 tablet 2   oxyCODONE (ROXICODONE) 15 MG immediate release tablet Take 1 tablet (15 mg total) by mouth every 6 (six) hours as needed. 60 tablet 0   pantoprazole (PROTONIX) 40 MG tablet TAKE 1 TABLET BY MOUTH 2 TIMES DAILY BEFORE MEALS 60 tablet 0   prochlorperazine (COMPAZINE) 5 MG tablet Take 1 tablet (5 mg total) by mouth every 6 (six) hours as needed for nausea or vomiting. 30 tablet 2   STIMULANT LAXATIVE 8.6-50 MG tablet TAKE 1 TABLET BY MOUTH EVERY DAY 30 tablet 6   tamsulosin (FLOMAX) 0.4 MG  CAPS capsule TAKE 1 CAPSULE BY MOUTH EVERY DAY 30 capsule 0   zolpidem (AMBIEN) 5 MG tablet Take 1 tablet (5 mg total) by mouth at bedtime as needed. for sleep 30 tablet 0   No current facility-administered medications for this visit.   Facility-Administered Medications Ordered in Other Visits  Medication Dose Route Frequency Provider Last Rate Last Admin   0.9 %  sodium chloride infusion (Manually program via Guardrails IV Fluids)  250 mL Intravenous Once [name], MD       heparin lock flush 100 unit/mL  500 Units Intracatheter Once [name], MD       sodium chloride flush (NS) 0.9 % injection 10 mL  10 mL Intracatheter Once  [name], MD        VITAL SIGNS: There were no vitals taken for this visit. There were no vitals filed for this visit.    Estimated body mass index is 19.87 kg/m as calculated from the following:   Height as of 05/22/23: 5\' 10"  (1.778 m).   Weight as of 05/22/23: 138 lb 8 oz (62.8 kg).   PERFORMANCE STATUS (ECOG) : 2 - Symptomatic, <50% confined to bed  Assessment NAD RRR Normal breathing pattern AAO x3  IMPRESSION: I saw Mr. Filippini during infusion. No acute distress. [name], Significant Other present. Patient denies nausea, vomiting, constipation, or diarrhea. Is trying to remain active. He is looking forward to upcoming beach trip next week. [name] is arranging dialysis.   Pain Abubakr reports his pain is well controlled. Some days are better than others. Is taking oxycodone as needed.  Tolerating without difficulty.  Does not take around-the-clock.   We will continue to closely monitor.    Decreased Appetite Appetite is slowly improving. States breakfast tends to be his better meal. He is enjoying some foods. Current weight is 139lbs up from 137lbs on 4/4.   PLAN: Oxy IR 5-10 mg every 4-6 hours as needed for breakthrough pain Appetite continues to be a challenge but slowly improving. Marinol on backorder. Followed by Dietician.  Xanax 1 mg every 8 hours as needed for anxiety/sleep Miralax twice daily Senna-S daily Zofran as needed for nausea.  I will plan to see him back in 3-4 weeks in collaboration with his other oncology appointments.   Patient expressed understanding and was in agreement with this plan. He also understands that He can call the clinic at any time with any questions, concerns, or complaints.     Any controlled substances utilized were prescribed in the context of palliative care. PDMP has been reviewed.    Visit consisted of counseling and education dealing with the complex and emotionally intense issues of symptom management and palliative care in the  setting of serious and potentially life-threatening illness.Greater than 50%  of this time was spent counseling and coordinating care related to the above assessment and plan.  Willette Alma, AGPCNP-BC  Palliative Medicine Team/[facility]  *Please note that this is a verbal dictation therefore any spelling or grammatical errors are due to the "Dragon Medical One" system interpretation.

## 2023-06-16 ENCOUNTER — Other Ambulatory Visit: Payer: Self-pay | Admitting: Nurse Practitioner

## 2023-06-17 ENCOUNTER — Ambulatory Visit: Payer: Commercial Managed Care - PPO

## 2023-06-19 ENCOUNTER — Encounter: Payer: Self-pay | Admitting: Nurse Practitioner

## 2023-06-19 ENCOUNTER — Inpatient Hospital Stay (HOSPITAL_BASED_OUTPATIENT_CLINIC_OR_DEPARTMENT_OTHER): Payer: Medicare Other | Admitting: Nurse Practitioner

## 2023-06-19 ENCOUNTER — Inpatient Hospital Stay: Payer: Medicare Other | Attending: Hematology

## 2023-06-19 ENCOUNTER — Other Ambulatory Visit: Payer: Self-pay | Admitting: Nurse Practitioner

## 2023-06-19 ENCOUNTER — Inpatient Hospital Stay: Payer: Medicare Other

## 2023-06-19 VITALS — BP 148/81 | HR 62 | Temp 97.9°F | Resp 18 | Wt 139.0 lb

## 2023-06-19 DIAGNOSIS — Z5112 Encounter for antineoplastic immunotherapy: Secondary | ICD-10-CM | POA: Insufficient documentation

## 2023-06-19 DIAGNOSIS — R630 Anorexia: Secondary | ICD-10-CM

## 2023-06-19 DIAGNOSIS — E8581 Light chain (AL) amyloidosis: Secondary | ICD-10-CM

## 2023-06-19 DIAGNOSIS — D638 Anemia in other chronic diseases classified elsewhere: Secondary | ICD-10-CM

## 2023-06-19 DIAGNOSIS — R634 Abnormal weight loss: Secondary | ICD-10-CM

## 2023-06-19 DIAGNOSIS — Z515 Encounter for palliative care: Secondary | ICD-10-CM

## 2023-06-19 DIAGNOSIS — N186 End stage renal disease: Secondary | ICD-10-CM | POA: Insufficient documentation

## 2023-06-19 DIAGNOSIS — Z992 Dependence on renal dialysis: Secondary | ICD-10-CM | POA: Diagnosis not present

## 2023-06-19 DIAGNOSIS — F419 Anxiety disorder, unspecified: Secondary | ICD-10-CM

## 2023-06-19 DIAGNOSIS — C9000 Multiple myeloma not having achieved remission: Secondary | ICD-10-CM | POA: Diagnosis present

## 2023-06-19 DIAGNOSIS — E854 Organ-limited amyloidosis: Secondary | ICD-10-CM

## 2023-06-19 DIAGNOSIS — G893 Neoplasm related pain (acute) (chronic): Secondary | ICD-10-CM

## 2023-06-19 DIAGNOSIS — I43 Cardiomyopathy in diseases classified elsewhere: Secondary | ICD-10-CM

## 2023-06-19 LAB — CBC WITH DIFFERENTIAL (CANCER CENTER ONLY)
Abs Immature Granulocytes: 0.02 10*3/uL (ref 0.00–0.07)
Basophils Absolute: 0.1 10*3/uL (ref 0.0–0.1)
Basophils Relative: 1 %
Eosinophils Absolute: 0.2 10*3/uL (ref 0.0–0.5)
Eosinophils Relative: 3 %
HCT: 28.2 % — ABNORMAL LOW (ref 39.0–52.0)
Hemoglobin: 9.1 g/dL — ABNORMAL LOW (ref 13.0–17.0)
Immature Granulocytes: 0 %
Lymphocytes Relative: 26 %
Lymphs Abs: 1.4 10*3/uL (ref 0.7–4.0)
MCH: 31.2 pg (ref 26.0–34.0)
MCHC: 32.3 g/dL (ref 30.0–36.0)
MCV: 96.6 fL (ref 80.0–100.0)
Monocytes Absolute: 0.4 10*3/uL (ref 0.1–1.0)
Monocytes Relative: 8 %
Neutro Abs: 3.3 10*3/uL (ref 1.7–7.7)
Neutrophils Relative %: 62 %
Platelet Count: 131 10*3/uL — ABNORMAL LOW (ref 150–400)
RBC: 2.92 MIL/uL — ABNORMAL LOW (ref 4.22–5.81)
RDW: 15 % (ref 11.5–15.5)
WBC Count: 5.3 10*3/uL (ref 4.0–10.5)
nRBC: 0 % (ref 0.0–0.2)

## 2023-06-19 LAB — COMPREHENSIVE METABOLIC PANEL
ALT: 7 U/L (ref 0–44)
AST: 11 U/L — ABNORMAL LOW (ref 15–41)
Albumin: 3.3 g/dL — ABNORMAL LOW (ref 3.5–5.0)
Alkaline Phosphatase: 74 U/L (ref 38–126)
Anion gap: 10 (ref 5–15)
BUN: 46 mg/dL — ABNORMAL HIGH (ref 8–23)
CO2: 25 mmol/L (ref 22–32)
Calcium: 8 mg/dL — ABNORMAL LOW (ref 8.9–10.3)
Chloride: 102 mmol/L (ref 98–111)
Creatinine, Ser: 5.86 mg/dL — ABNORMAL HIGH (ref 0.61–1.24)
GFR, Estimated: 10 mL/min — ABNORMAL LOW (ref >60)
Glucose, Bld: 86 mg/dL (ref 70–99)
Potassium: 5 mmol/L (ref 3.5–5.1)
Sodium: 137 mmol/L (ref 135–145)
Total Bilirubin: 0.3 mg/dL (ref 0.3–1.2)
Total Protein: 5.6 g/dL — ABNORMAL LOW (ref 6.5–8.1)

## 2023-06-19 LAB — FERRITIN: Ferritin: 535 ng/mL — ABNORMAL HIGH (ref 24–336)

## 2023-06-19 MED ORDER — OXYCODONE HCL 15 MG PO TABS
15.0000 mg | ORAL_TABLET | Freq: Four times a day (QID) | ORAL | 0 refills | Status: DC | PRN
Start: 2023-06-21 — End: 2023-07-09

## 2023-06-19 MED ORDER — ALPRAZOLAM 1 MG PO TABS
1.0000 mg | ORAL_TABLET | Freq: Three times a day (TID) | ORAL | 0 refills | Status: DC | PRN
Start: 2023-06-19 — End: 2023-08-04

## 2023-06-19 MED ORDER — DARATUMUMAB-HYALURONIDASE-FIHJ 1800-30000 MG-UT/15ML ~~LOC~~ SOLN
1800.0000 mg | Freq: Once | SUBCUTANEOUS | Status: AC
  Administered 2023-06-19: 1800 mg via SUBCUTANEOUS
  Filled 2023-06-19: qty 15

## 2023-06-19 MED ORDER — DIPHENHYDRAMINE HCL 25 MG PO CAPS
50.0000 mg | ORAL_CAPSULE | Freq: Once | ORAL | Status: AC
  Administered 2023-06-19: 50 mg via ORAL
  Filled 2023-06-19: qty 2

## 2023-06-19 MED ORDER — ACETAMINOPHEN 325 MG PO TABS
650.0000 mg | ORAL_TABLET | Freq: Once | ORAL | Status: AC
  Administered 2023-06-19: 650 mg via ORAL
  Filled 2023-06-19: qty 2

## 2023-06-19 MED ORDER — DRONABINOL 2.5 MG PO CAPS
2.5000 mg | ORAL_CAPSULE | Freq: Every day | ORAL | 0 refills | Status: DC
Start: 2023-06-19 — End: 2023-09-29

## 2023-06-19 NOTE — Patient Instructions (Signed)
[facility]  Discharge Instructions: Thank you for choosing [facility] to provide your oncology and hematology care.   If you have a lab appointment with the Cancer Center, please go directly to the Cancer Center and check in at the registration area.   Wear comfortable clothing and clothing appropriate for easy access to any Portacath or PICC line.   We strive to give you quality time with your provider. You may need to reschedule your appointment if you arrive late (15 or more minutes).  Arriving late affects you and other patients whose appointments are after yours.  Also, if you miss three or more appointments without notifying the office, you may be dismissed from the clinic at the provider's discretion.      For prescription refill requests, have your pharmacy contact our office and allow 72 hours for refills to be completed.    Today you received the following chemotherapy and/or immunotherapy agents Darzalex Faspro      To help prevent nausea and vomiting after your treatment, we encourage you to take your nausea medication as directed.  BELOW ARE SYMPTOMS THAT SHOULD BE REPORTED IMMEDIATELY: *FEVER GREATER THAN 100.4 F (38 C) OR HIGHER *CHILLS OR SWEATING *NAUSEA AND VOMITING THAT IS NOT CONTROLLED WITH YOUR NAUSEA MEDICATION *UNUSUAL SHORTNESS OF BREATH *UNUSUAL BRUISING OR BLEEDING *URINARY PROBLEMS (pain or burning when urinating, or frequent urination) *BOWEL PROBLEMS (unusual diarrhea, constipation, pain near the anus) TENDERNESS IN MOUTH AND THROAT WITH OR WITHOUT PRESENCE OF ULCERS (sore throat, sores in mouth, or a toothache) UNUSUAL RASH, SWELLING OR PAIN  UNUSUAL VAGINAL DISCHARGE OR ITCHING   Items with * indicate a potential emergency and should be followed up as soon as possible or go to the Emergency Department if any problems should occur.  Please show the CHEMOTHERAPY ALERT CARD or IMMUNOTHERAPY ALERT CARD at  check-in to the Emergency Department and triage nurse.  Should you have questions after your visit or need to cancel or reschedule your appointment, please contact [facility]  Dept: [phone]  and follow the prompts.  Office hours are 8:00 a.m. to 4:30 p.m. Monday - Friday. Please note that voicemails left after 4:00 p.m. may not be returned until the following business day.  We are closed weekends and major holidays. You have access to a nurse at all times for urgent questions. Please call the main number to the clinic Dept: [phone] and follow the prompts.   For any non-urgent questions, you may also contact your provider using MyChart. We now offer e-Visits for anyone 18 and older to request care online for non-urgent symptoms. For details visit [email or web address].   Also download the MyChart app! Go to the app store, search "MyChart", open the app, select [facility], and log in with your MyChart username and password.  

## 2023-06-20 LAB — KAPPA/LAMBDA LIGHT CHAINS
Kappa free light chain: 51.5 mg/L — ABNORMAL HIGH (ref 3.3–19.4)
Kappa, lambda light chain ratio: 2.02 — ABNORMAL HIGH (ref 0.26–1.65)
Lambda free light chains: 25.5 mg/L (ref 5.7–26.3)

## 2023-07-01 ENCOUNTER — Other Ambulatory Visit: Payer: Self-pay | Admitting: Hematology

## 2023-07-07 NOTE — Progress Notes (Deleted)
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 male patient of Dr Shirlee Latch, in the office today to discuss options for cholesterol management.   I saw him about a year ago and reviewed all options available at that time.    Last labs drawn 12/2021, needs updated   Insurance Carrier: [employer] commercial plan  LDL Cholesterol goal:  LDL <70  Current Medications:   ezetimibe 10 mg  Previously tried:  atorvastatin   Family Hx:     Social Hx: Tobacco: Alcohol:      Diet:      Exercise:   Adherence Assessment  Do you ever forget to take your medication? [] Yes [] No  Do you ever skip doses due to side effects? [] Yes [] No  Do you have trouble affording your medicines? [] Yes [] No  Are you ever unable to pick up your medication due to transportation difficulties? [] Yes [] No  Do you ever stop taking your medications because you don't believe they are helping? [] Yes [] No  Do you check your weight daily? [] Yes [] No   Adherence strategy: ***  Barriers to obtaining medications: ***     Accessory Clinical Findings   Lab Results  Component Value Date   CHOL 239 (H) 01/15/2022   HDL 28 (L) 01/15/2022   LDLCALC UNABLE TO CALCULATE IF TRIGLYCERIDE OVER 400 mg/dL 96/03/5408   LDLDIRECT 85.5 01/15/2022   TRIG 535 (H) 01/15/2022   CHOLHDL 8.5 01/15/2022    No results found for: "LIPOA"  Lab Results   Component Value Date   ALT 7 06/19/2023   AST 11 (L) 06/19/2023   ALKPHOS 74 06/19/2023   BILITOT 0.3 06/19/2023   Lab Results  Component Value Date   CREATININE 5.86 (H) 06/19/2023   BUN 46 (H) 06/19/2023   NA 137 06/19/2023   K 5.0 06/19/2023   CL 102 06/19/2023   CO2 25 06/19/2023   Lab Results  Component Value Date   HGBA1C 5.6 01/16/2022    Home Medications    Current Outpatient Medications  Medication Sig Dispense Refill   acetaminophen (TYLENOL) 500 MG tablet Take 1,000 mg by mouth every 6 (six) hours as needed for moderate pain.     ALPRAZolam (XANAX) 1 MG tablet Take 1 tablet (1 mg total) by mouth every 8 (eight) hours as needed for anxiety or sleep. 90 tablet 0   aspirin 81 MG EC tablet Take 1 tablet (81 mg total) by mouth daily. Swallow whole. 30 tablet 11   cetirizine (ZYRTEC) 10 MG tablet Take 10 mg by mouth daily.     dexamethasone (DECADRON) 4 MG tablet Take 5 tablets (20 mg total) by mouth once a week. Take 1-2 hour before chemo injection 30 tablet 1   diclofenac Sodium (VOLTAREN) 1 % GEL Apply 1 Application topically 4 (four) times daily as needed (pain).     dronabinol (MARINOL) 2.5  MG capsule Take 1 capsule (2.5 mg total) by mouth daily in the afternoon. 30 capsule 0   ezetimibe (ZETIA) 10 MG tablet Take 1 tablet (10 mg total) by mouth daily. 90 tablet 3   folic acid (FOLVITE) 1 MG tablet TAKE 2 TABLETS BY MOUTH EVERY DAY 60 tablet 0   Melatonin 10 MG TABS Take 10 mg by mouth at bedtime.     metoprolol succinate (TOPROL-XL) 25 MG 24 hr tablet TAKE 1/2 TABLET BY MOUTH EVERY DAY 15 tablet 4   midodrine (PROAMATINE) 5 MG tablet TAKE 1 TABLET BY MOUTH 3 TIMES DAILY WITH MEALS 270 tablet 3   mirtazapine (REMERON) 30 MG tablet TAKE 1 TABLET BY MOUTH AT BEDTIME 30 tablet 1   ondansetron (ZOFRAN) 4 MG tablet TAKE 1 TABLET BY MOUTH EVERY 8 HOURS AS NEEDED FOR NAUSEA AND VOMITING 30 tablet 2   oxyCODONE (ROXICODONE) 15 MG immediate release tablet Take 1 tablet (15 mg  total) by mouth every 6 (six) hours as needed. 84 tablet 0   pantoprazole (PROTONIX) 40 MG tablet TAKE 1 TABLET BY MOUTH 2 TIMES DAILY BEFORE MEALS 60 tablet 0   prochlorperazine (COMPAZINE) 5 MG tablet Take 1 tablet (5 mg total) by mouth every 6 (six) hours as needed for nausea or vomiting. 30 tablet 2   STIMULANT LAXATIVE 8.6-50 MG tablet TAKE 1 TABLET BY MOUTH EVERY DAY 30 tablet 6   tamsulosin (FLOMAX) 0.4 MG CAPS capsule TAKE 1 CAPSULE BY MOUTH EVERY DAY 30 capsule 0   zolpidem (AMBIEN) 5 MG tablet Take 1 tablet (5 mg total) by mouth at bedtime as needed. for sleep 30 tablet 0   No current facility-administered medications for this visit.   Facility-Administered Medications Ordered in Other Visits  Medication Dose Route Frequency Provider Last Rate Last Admin   0.9 %  sodium chloride infusion (Manually program via Guardrails IV Fluids)  250 mL Intravenous Once [name], MD       heparin lock flush 100 unit/mL  500 Units Intracatheter Once [name], MD       sodium chloride flush (NS) 0.9 % injection 10 mL  10 mL Intracatheter Once [name], MD         Assessment & Plan    No problem-specific Assessment & Plan notes found for this encounter.   Phillips Hay, PharmD CPP [facility] [address]  [place], [place] [address] [phone]  07/07/2023, 4:06 PM

## 2023-07-08 ENCOUNTER — Ambulatory Visit: Payer: Commercial Managed Care - PPO | Attending: Cardiology

## 2023-07-09 ENCOUNTER — Other Ambulatory Visit: Payer: Self-pay

## 2023-07-09 DIAGNOSIS — G893 Neoplasm related pain (acute) (chronic): Secondary | ICD-10-CM

## 2023-07-09 DIAGNOSIS — E8581 Light chain (AL) amyloidosis: Secondary | ICD-10-CM

## 2023-07-09 DIAGNOSIS — Z515 Encounter for palliative care: Secondary | ICD-10-CM

## 2023-07-09 DIAGNOSIS — E854 Organ-limited amyloidosis: Secondary | ICD-10-CM

## 2023-07-09 MED ORDER — OXYCODONE HCL 15 MG PO TABS
15.0000 mg | ORAL_TABLET | Freq: Four times a day (QID) | ORAL | 0 refills | Status: DC | PRN
Start: 2023-07-11 — End: 2023-08-04

## 2023-07-11 ENCOUNTER — Other Ambulatory Visit: Payer: Self-pay | Admitting: Nurse Practitioner

## 2023-07-11 DIAGNOSIS — Z515 Encounter for palliative care: Secondary | ICD-10-CM

## 2023-07-11 DIAGNOSIS — E854 Organ-limited amyloidosis: Secondary | ICD-10-CM

## 2023-07-11 DIAGNOSIS — G4700 Insomnia, unspecified: Secondary | ICD-10-CM

## 2023-07-11 MED ORDER — ZOLPIDEM TARTRATE 5 MG PO TABS
5.0000 mg | ORAL_TABLET | Freq: Every evening | ORAL | 0 refills | Status: DC | PRN
Start: 2023-07-11 — End: 2023-10-08

## 2023-07-15 NOTE — Progress Notes (Unsigned)
Palliative Medicine [facility]  Telephone:(336) [phone] Fax:(336) [phone]   Name: Caleb Simmons Date: 07/15/2023 MRN: [id number]  DOB: 10-Jun-1951  Patient Care Team: [name], MD as PCP - General (Hematology) Buntin, Daisy Floro, RN (Inactive) as Registered Nurse Pickenpack-Cousar, Arty Baumgartner, NP as Nurse Practitioner (Nurse Practitioner) [name], MD as Attending Physician (Hematology and Oncology)    INTERVAL HISTORY: Caleb Simmons is a 72 y.o. 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  Recently admitted and discharged on 01/20/22 after receiving treatment for left MCA CVA. Palliative following for ongoing goals of care discussions and symptom management.   SOCIAL HISTORY:     reports that he has been smoking cigarettes. He has a 6.3 pack-year smoking history. He has never used smokeless tobacco.  ADVANCE DIRECTIVES:  Patient reports completed document.  [name]. is his healthcare power of attorney.  CODE STATUS: Full code  PAST MEDICAL HISTORY: Past Medical History:  Diagnosis Date   Cancer (HCC)     ALLERGIES:  has No Known Allergies.  MEDICATIONS:  Current Outpatient Medications  Medication Sig Dispense Refill   acetaminophen (TYLENOL) 500 MG tablet Take 1,000 mg by mouth every 6 (six) hours as needed for moderate pain.     ALPRAZolam (XANAX) 1 MG tablet Take 1 tablet (1 mg total) by mouth every 8 (eight) hours as needed for anxiety or sleep. 90 tablet 0   aspirin 81 MG EC tablet Take 1 tablet (81 mg total) by mouth daily. Swallow whole. 30 tablet 11   cetirizine (ZYRTEC) 10 MG tablet Take 10 mg by mouth daily.     dexamethasone (DECADRON) 4 MG tablet Take 5 tablets (20 mg total) by mouth once a week. Take 1-2 hour before chemo injection 30 tablet 1   diclofenac Sodium (VOLTAREN) 1 % GEL Apply 1 Application  topically 4 (four) times daily as needed (pain).     dronabinol (MARINOL) 2.5 MG capsule Take 1 capsule (2.5 mg total) by mouth daily in the afternoon. 30 capsule 0   ezetimibe (ZETIA) 10 MG tablet Take 1 tablet (10 mg total) by mouth daily. 90 tablet 3   folic acid (FOLVITE) 1 MG tablet TAKE 2 TABLETS BY MOUTH EVERY DAY 60 tablet 0   Melatonin 10 MG TABS Take 10 mg by mouth at bedtime.     metoprolol succinate (TOPROL-XL) 25 MG 24 hr tablet TAKE 1/2 TABLET BY MOUTH EVERY DAY 15 tablet 4   midodrine (PROAMATINE) 5 MG tablet TAKE 1 TABLET BY MOUTH 3 TIMES DAILY WITH MEALS 270 tablet 3   mirtazapine (REMERON) 30 MG tablet TAKE 1 TABLET BY MOUTH AT BEDTIME 30 tablet 1   ondansetron (ZOFRAN) 4 MG tablet TAKE 1 TABLET BY MOUTH EVERY 8 HOURS AS NEEDED FOR NAUSEA AND VOMITING 30 tablet 2   oxyCODONE (ROXICODONE) 15 MG immediate release tablet Take 1 tablet (15 mg total) by mouth every 6 (six) hours as needed. 84 tablet 0   pantoprazole (PROTONIX) 40 MG tablet TAKE 1 TABLET BY MOUTH 2 TIMES DAILY BEFORE MEALS 60 tablet 0   prochlorperazine (COMPAZINE) 5 MG tablet Take 1 tablet (5 mg total) by mouth every 6 (six) hours as needed for nausea or vomiting. 30 tablet 2   STIMULANT LAXATIVE 8.6-50 MG tablet TAKE 1 TABLET BY MOUTH EVERY DAY 30 tablet 6   tamsulosin (FLOMAX) 0.4 MG  CAPS capsule TAKE 1 CAPSULE BY MOUTH EVERY DAY 30 capsule 0   zolpidem (AMBIEN) 5 MG tablet TAKE 1 TABLET BY MOUTH NIGHTLY AT BEDTIME AS NEEDED FOR SLEEP 30 tablet 1   zolpidem (AMBIEN) 5 MG tablet Take 1 tablet (5 mg total) by mouth at bedtime as needed. for sleep 30 tablet 0   No current facility-administered medications for this visit.   Facility-Administered Medications Ordered in Other Visits  Medication Dose Route Frequency Provider Last Rate Last Admin   0.9 %  sodium chloride infusion (Manually program via Guardrails IV Fluids)  250 mL Intravenous Once [name], MD       heparin lock flush 100 unit/mL  500 Units  Intracatheter Once [name], MD       sodium chloride flush (NS) 0.9 % injection 10 mL  10 mL Intracatheter Once [name], MD        VITAL SIGNS: There were no vitals taken for this visit. There were no vitals filed for this visit.    Estimated body mass index is 19.94 kg/m as calculated from the following:   Height as of 05/22/23: 5\' 10"  (1.778 m).   Weight as of 06/19/23: 139 lb (63 kg).   PERFORMANCE STATUS (ECOG) : 2 - Symptomatic, <50% confined to bed  Assessment NAD RRR Normal breathing pattern AAO x3  IMPRESSION:  I saw Mr. Burgen during his infusion. [name] is also present. Doing well overall. No acute distress. Shares he enjoyed recent beach trip. Denies nausea, vomiting, constipation, or diarrhea. Is trying to remain as active as possible. Some days are better than others. Tolerating dialysis.   Pain Perry reports his pain is well controlled. Is taking oxycodone as needed.  Tolerating without difficulty.  Does not take around-the-clock.   We will continue to closely monitor.    Decreased Appetite Appetite is improving with appetite stimulant. Tolerating marinol 2.5mg . weight is up to 140lbs. He is much appreciative of this.   PLAN: Oxy IR 5-10 mg every 4-6 hours as needed for breakthrough pain Appetite continues to be a challenge but slowly improving. Marinol 2.5mg  Followed by Dietician.  Xanax 1 mg every 8 hours as needed for anxiety/sleep Miralax twice daily Senna-S daily Zofran as needed for nausea.  I will plan to see him back in 3-4 weeks in collaboration with his other oncology appointments.   Patient expressed understanding and was in agreement with this plan. He also understands that He can call the clinic at any time with any questions, concerns, or complaints.     Any controlled substances utilized were prescribed in the context of palliative care. PDMP has been reviewed.    Visit consisted of counseling and education dealing with the complex and  emotionally intense issues of symptom management and palliative care in the setting of serious and potentially life-threatening illness.Greater than 50%  of this time was spent counseling and coordinating care related to the above assessment and plan.  Willette Alma, AGPCNP-BC  Palliative Medicine Team/[facility]  *Please note that this is a verbal dictation therefore any spelling or grammatical errors are due to the "Dragon Medical One" system interpretation.

## 2023-07-17 ENCOUNTER — Other Ambulatory Visit: Payer: Self-pay

## 2023-07-17 ENCOUNTER — Inpatient Hospital Stay: Payer: Medicare Other

## 2023-07-17 ENCOUNTER — Inpatient Hospital Stay (HOSPITAL_BASED_OUTPATIENT_CLINIC_OR_DEPARTMENT_OTHER): Payer: Medicare Other | Admitting: Nurse Practitioner

## 2023-07-17 ENCOUNTER — Inpatient Hospital Stay: Payer: Medicare Other | Attending: Hematology

## 2023-07-17 ENCOUNTER — Encounter: Payer: Self-pay | Admitting: Nurse Practitioner

## 2023-07-17 VITALS — BP 143/81 | HR 58 | Temp 98.1°F | Resp 18 | Wt 140.0 lb

## 2023-07-17 DIAGNOSIS — E8581 Light chain (AL) amyloidosis: Secondary | ICD-10-CM

## 2023-07-17 DIAGNOSIS — Z5112 Encounter for antineoplastic immunotherapy: Secondary | ICD-10-CM | POA: Diagnosis present

## 2023-07-17 DIAGNOSIS — C9000 Multiple myeloma not having achieved remission: Secondary | ICD-10-CM | POA: Diagnosis present

## 2023-07-17 DIAGNOSIS — Z515 Encounter for palliative care: Secondary | ICD-10-CM | POA: Diagnosis not present

## 2023-07-17 DIAGNOSIS — R630 Anorexia: Secondary | ICD-10-CM | POA: Diagnosis not present

## 2023-07-17 DIAGNOSIS — N186 End stage renal disease: Secondary | ICD-10-CM | POA: Insufficient documentation

## 2023-07-17 DIAGNOSIS — Z992 Dependence on renal dialysis: Secondary | ICD-10-CM | POA: Diagnosis not present

## 2023-07-17 DIAGNOSIS — R530 Neoplastic (malignant) related fatigue: Secondary | ICD-10-CM

## 2023-07-17 DIAGNOSIS — F419 Anxiety disorder, unspecified: Secondary | ICD-10-CM

## 2023-07-17 DIAGNOSIS — G893 Neoplasm related pain (acute) (chronic): Secondary | ICD-10-CM

## 2023-07-17 DIAGNOSIS — R634 Abnormal weight loss: Secondary | ICD-10-CM

## 2023-07-17 LAB — CBC WITH DIFFERENTIAL/PLATELET
Abs Immature Granulocytes: 0.03 10*3/uL (ref 0.00–0.07)
Basophils Absolute: 0.1 10*3/uL (ref 0.0–0.1)
Basophils Relative: 1 %
Eosinophils Absolute: 0.3 10*3/uL (ref 0.0–0.5)
Eosinophils Relative: 4 %
HCT: 29 % — ABNORMAL LOW (ref 39.0–52.0)
Hemoglobin: 9.4 g/dL — ABNORMAL LOW (ref 13.0–17.0)
Immature Granulocytes: 1 %
Lymphocytes Relative: 25 %
Lymphs Abs: 1.5 10*3/uL (ref 0.7–4.0)
MCH: 30.9 pg (ref 26.0–34.0)
MCHC: 32.4 g/dL (ref 30.0–36.0)
MCV: 95.4 fL (ref 80.0–100.0)
Monocytes Absolute: 0.5 10*3/uL (ref 0.1–1.0)
Monocytes Relative: 8 %
Neutro Abs: 3.8 10*3/uL (ref 1.7–7.7)
Neutrophils Relative %: 61 %
Platelets: 152 10*3/uL (ref 150–400)
RBC: 3.04 MIL/uL — ABNORMAL LOW (ref 4.22–5.81)
RDW: 14.2 % (ref 11.5–15.5)
WBC: 6.2 10*3/uL (ref 4.0–10.5)
nRBC: 0 % (ref 0.0–0.2)

## 2023-07-17 LAB — COMPREHENSIVE METABOLIC PANEL
ALT: 8 U/L (ref 0–44)
AST: 11 U/L — ABNORMAL LOW (ref 15–41)
Albumin: 3.6 g/dL (ref 3.5–5.0)
Alkaline Phosphatase: 88 U/L (ref 38–126)
Anion gap: 10 (ref 5–15)
BUN: 49 mg/dL — ABNORMAL HIGH (ref 8–23)
CO2: 25 mmol/L (ref 22–32)
Calcium: 8.5 mg/dL — ABNORMAL LOW (ref 8.9–10.3)
Chloride: 101 mmol/L (ref 98–111)
Creatinine, Ser: 5.97 mg/dL — ABNORMAL HIGH (ref 0.61–1.24)
GFR, Estimated: 9 mL/min — ABNORMAL LOW (ref >60)
Glucose, Bld: 85 mg/dL (ref 70–99)
Potassium: 4.9 mmol/L (ref 3.5–5.1)
Sodium: 136 mmol/L (ref 135–145)
Total Bilirubin: 0.3 mg/dL (ref 0.3–1.2)
Total Protein: 5.6 g/dL — ABNORMAL LOW (ref 6.5–8.1)

## 2023-07-17 MED ORDER — DARATUMUMAB-HYALURONIDASE-FIHJ 1800-30000 MG-UT/15ML ~~LOC~~ SOLN
1800.0000 mg | Freq: Once | SUBCUTANEOUS | Status: AC
  Administered 2023-07-17: 1800 mg via SUBCUTANEOUS
  Filled 2023-07-17: qty 15

## 2023-07-17 MED ORDER — ACETAMINOPHEN 325 MG PO TABS
650.0000 mg | ORAL_TABLET | Freq: Once | ORAL | Status: AC
  Administered 2023-07-17: 650 mg via ORAL
  Filled 2023-07-17: qty 2

## 2023-07-17 MED ORDER — DIPHENHYDRAMINE HCL 25 MG PO CAPS
50.0000 mg | ORAL_CAPSULE | Freq: Once | ORAL | Status: AC
  Administered 2023-07-17: 50 mg via ORAL
  Filled 2023-07-17: qty 2

## 2023-07-17 NOTE — Patient Instructions (Signed)
[facility]  Discharge Instructions: Thank you for choosing [facility] to provide your oncology and hematology care.   If you have a lab appointment with the Cancer Center, please go directly to the Cancer Center and check in at the registration area.   Wear comfortable clothing and clothing appropriate for easy access to any Portacath or PICC line.   We strive to give you quality time with your provider. You may need to reschedule your appointment if you arrive late (15 or more minutes).  Arriving late affects you and other patients whose appointments are after yours.  Also, if you miss three or more appointments without notifying the office, you may be dismissed from the clinic at the provider's discretion.      For prescription refill requests, have your pharmacy contact our office and allow 72 hours for refills to be completed.    Today you received the following chemotherapy and/or immunotherapy agents: Dara Faspro.    To help prevent nausea and vomiting after your treatment, we encourage you to take your nausea medication as directed.  BELOW ARE SYMPTOMS THAT SHOULD BE REPORTED IMMEDIATELY: *FEVER GREATER THAN 100.4 F (38 C) OR HIGHER *CHILLS OR SWEATING *NAUSEA AND VOMITING THAT IS NOT CONTROLLED WITH YOUR NAUSEA MEDICATION *UNUSUAL SHORTNESS OF BREATH *UNUSUAL BRUISING OR BLEEDING *URINARY PROBLEMS (pain or burning when urinating, or frequent urination) *BOWEL PROBLEMS (unusual diarrhea, constipation, pain near the anus) TENDERNESS IN MOUTH AND THROAT WITH OR WITHOUT PRESENCE OF ULCERS (sore throat, sores in mouth, or a toothache) UNUSUAL RASH, SWELLING OR PAIN  UNUSUAL VAGINAL DISCHARGE OR ITCHING   Items with * indicate a potential emergency and should be followed up as soon as possible or go to the Emergency Department if any problems should occur.  Please show the CHEMOTHERAPY ALERT CARD or IMMUNOTHERAPY ALERT CARD at  check-in to the Emergency Department and triage nurse.  Should you have questions after your visit or need to cancel or reschedule your appointment, please contact [facility]  Dept: [phone]  and follow the prompts.  Office hours are 8:00 a.m. to 4:30 p.m. Monday - Friday. Please note that voicemails left after 4:00 p.m. may not be returned until the following business day.  We are closed weekends and major holidays. You have access to a nurse at all times for urgent questions. Please call the main number to the clinic Dept: [phone] and follow the prompts.   For any non-urgent questions, you may also contact your provider using MyChart. We now offer e-Visits for anyone 18 and older to request care online for non-urgent symptoms. For details visit [email or web address].   Also download the MyChart app! Go to the app store, search "MyChart", open the app, select , and log in with your MyChart username and password.   

## 2023-08-04 ENCOUNTER — Other Ambulatory Visit: Payer: Self-pay | Admitting: Nurse Practitioner

## 2023-08-04 DIAGNOSIS — E854 Organ-limited amyloidosis: Secondary | ICD-10-CM

## 2023-08-04 DIAGNOSIS — F419 Anxiety disorder, unspecified: Secondary | ICD-10-CM

## 2023-08-04 DIAGNOSIS — E8581 Light chain (AL) amyloidosis: Secondary | ICD-10-CM

## 2023-08-04 DIAGNOSIS — Z515 Encounter for palliative care: Secondary | ICD-10-CM

## 2023-08-04 DIAGNOSIS — G893 Neoplasm related pain (acute) (chronic): Secondary | ICD-10-CM

## 2023-08-04 MED ORDER — ALPRAZOLAM 1 MG PO TABS
1.0000 mg | ORAL_TABLET | Freq: Three times a day (TID) | ORAL | 0 refills | Status: DC | PRN
Start: 2023-08-04 — End: 2023-09-09

## 2023-08-04 MED ORDER — OXYCODONE HCL 15 MG PO TABS
15.0000 mg | ORAL_TABLET | Freq: Four times a day (QID) | ORAL | 0 refills | Status: DC | PRN
Start: 2023-08-04 — End: 2023-08-26

## 2023-08-06 ENCOUNTER — Encounter: Payer: Self-pay | Admitting: Hematology

## 2023-08-06 ENCOUNTER — Other Ambulatory Visit: Payer: Self-pay | Admitting: Hematology and Oncology

## 2023-08-06 DIAGNOSIS — E8581 Light chain (AL) amyloidosis: Secondary | ICD-10-CM

## 2023-08-11 ENCOUNTER — Other Ambulatory Visit (HOSPITAL_COMMUNITY): Payer: Self-pay | Admitting: Cardiology

## 2023-08-11 ENCOUNTER — Other Ambulatory Visit: Payer: Self-pay | Admitting: Hematology

## 2023-08-13 ENCOUNTER — Other Ambulatory Visit: Payer: Self-pay | Admitting: Nurse Practitioner

## 2023-08-13 DIAGNOSIS — Z515 Encounter for palliative care: Secondary | ICD-10-CM

## 2023-08-13 DIAGNOSIS — G4700 Insomnia, unspecified: Secondary | ICD-10-CM

## 2023-08-13 DIAGNOSIS — E854 Organ-limited amyloidosis: Secondary | ICD-10-CM

## 2023-08-13 NOTE — Progress Notes (Unsigned)
Palliative Medicine [facility]  Telephone:(336) [phone] Fax:(336) [phone]   Name: Caleb Simmons Date: 08/13/2023 MRN: [id number]  DOB: [birth year]  Patient Care Team: [name], MD as PCP - General (Hematology) Buntin, Daisy Floro, RN (Inactive) as Registered Nurse Pickenpack-Cousar, Arty Baumgartner, NP as Nurse Practitioner (Nurse Practitioner) [name], MD as Attending Physician (Hematology and Oncology)    INTERVAL HISTORY: Caleb Simmons is a 72 y.o. 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  Recently admitted and discharged on 01/20/22 after receiving treatment for left MCA CVA. Palliative following for ongoing goals of care discussions and symptom management.   SOCIAL HISTORY:     reports that he has been smoking cigarettes. He has a 6.3 pack-year smoking history. He has never used smokeless tobacco.  ADVANCE DIRECTIVES:  Patient reports completed document.  [name], Pick. is his healthcare power of attorney.  CODE STATUS: Full code  PAST MEDICAL HISTORY: Past Medical History:  Diagnosis Date   Cancer (HCC)     ALLERGIES:  has No Known Allergies.  MEDICATIONS:  Current Outpatient Medications  Medication Sig Dispense Refill   acetaminophen (TYLENOL) 500 MG tablet Take 1,000 mg by mouth every 6 (six) hours as needed for moderate pain.     ALPRAZolam (XANAX) 1 MG tablet Take 1 tablet (1 mg total) by mouth every 8 (eight) hours as needed for anxiety or sleep. 90 tablet 0   aspirin 81 MG EC tablet Take 1 tablet (81 mg total) by mouth daily. Swallow whole. 30 tablet 11   cetirizine (ZYRTEC) 10 MG tablet Take 10 mg by mouth daily.     dexamethasone (DECADRON) 4 MG tablet Take 5 tablets (20 mg total) by mouth once a week. Take 1-2 hour before chemo injection 30 tablet 1   diclofenac Sodium (VOLTAREN) 1 % GEL Apply 1 Application  topically 4 (four) times daily as needed (pain).     dronabinol (MARINOL) 2.5 MG capsule Take 1 capsule (2.5 mg total) by mouth daily in the afternoon. 30 capsule 0   ezetimibe (ZETIA) 10 MG tablet Take 1 tablet (10 mg total) by mouth daily. 90 tablet 3   folic acid (FOLVITE) 1 MG tablet TAKE 2 TABLETS BY MOUTH EVERY DAY 60 tablet 0   Melatonin 10 MG TABS Take 10 mg by mouth at bedtime.     metoprolol succinate (TOPROL-XL) 25 MG 24 hr tablet TAKE 1/2 TABLET BY MOUTH EVERY DAY 15 tablet 4   midodrine (PROAMATINE) 5 MG tablet TAKE 1 TABLET BY MOUTH 3 TIMES DAILY WITH MEALS 270 tablet 3   mirtazapine (REMERON) 30 MG tablet TAKE 1 TABLET BY MOUTH AT BEDTIME 30 tablet 1   ondansetron (ZOFRAN) 4 MG tablet TAKE 1 TABLET BY MOUTH EVERY 8 HOURS AS NEEDED FOR NAUSEA AND VOMITING 30 tablet 2   oxyCODONE (ROXICODONE) 15 MG immediate release tablet Take 1 tablet (15 mg total) by mouth every 6 (six) hours as needed. 84 tablet 0   pantoprazole (PROTONIX) 40 MG tablet TAKE 1 TABLET BY MOUTH 2 TIMES DAILY BEFORE MEALS 60 tablet 0   prochlorperazine (COMPAZINE) 5 MG tablet Take 1 tablet (5 mg total) by mouth every 6 (six) hours as needed for nausea or vomiting. 30 tablet 2   STIMULANT LAXATIVE 8.6-50 MG tablet TAKE 1 TABLET BY MOUTH EVERY DAY 30 tablet 6   tamsulosin (FLOMAX) 0.4 MG  CAPS capsule TAKE 1 CAPSULE BY MOUTH EVERY DAY 30 capsule 0   zolpidem (AMBIEN) 5 MG tablet TAKE 1 TABLET BY MOUTH NIGHTLY AT BEDTIME AS NEEDED FOR SLEEP 30 tablet 1   zolpidem (AMBIEN) 5 MG tablet Take 1 tablet (5 mg total) by mouth at bedtime as needed. for sleep 30 tablet 0   No current facility-administered medications for this visit.   Facility-Administered Medications Ordered in Other Visits  Medication Dose Route Frequency Provider Last Rate Last Admin   0.9 %  sodium chloride infusion (Manually program via Guardrails IV Fluids)  250 mL Intravenous Once [name], MD       heparin lock flush 100 unit/mL  500 Units  Intracatheter Once [name], MD       sodium chloride flush (NS) 0.9 % injection 10 mL  10 mL Intracatheter Once [name], MD        VITAL SIGNS: There were no vitals taken for this visit. There were no vitals filed for this visit.    Estimated body mass index is 20.09 kg/m as calculated from the following:   Height as of 05/22/23: 5\' 10"  (1.778 m).   Weight as of 07/17/23: 140 lb (63.5 kg).   PERFORMANCE STATUS (ECOG) : 2 - Symptomatic, <50% confined to bed  Assessment NAD RRR Normal breathing pattern AAO x3  IMPRESSION: I saw Caleb Simmons during his infusion. Caleb Simmons is present. No acute distress identified. Denies nausea, vomiting, constipation, or diarrhea. Is trying to remain as active as possible. Overall is doing well. Shares he is coming up on the end of his treatment plan which he is looking forward to.   Pain Caleb Simmons reports his pain is well controlled. Is taking oxycodone as needed.  Tolerating without difficulty.  Does not take around-the-clock.   We will continue to closely monitor.    Decreased Appetite Appetite continues to improve with appetite stimulant. Current weight 146lbs up from 140 lbs on 7/25, 139lbs on 6/27lbs.   PLAN: Oxy IR 5-10 mg every 4-6 hours as needed for breakthrough pain Appetite continues to be a challenge but slowly improving. Marinol 2.5mg  Followed by Caleb Simmons.  Xanax 1 mg every 8 hours as needed for anxiety/sleep Miralax twice daily Senna-S daily Zofran as needed for nausea.  I will plan to see him back in 3-4 weeks in collaboration with his other oncology appointments.   Patient expressed understanding and was in agreement with this plan. He also understands that He can call the clinic at any time with any questions, concerns, or complaints.     Any controlled substances utilized were prescribed in the context of palliative care. PDMP has been reviewed.    Visit consisted of counseling and education dealing with the complex and  emotionally intense issues of symptom management and palliative care in the setting of serious and potentially life-threatening illness.Greater than 50%  of this time was spent counseling and coordinating care related to the above assessment and plan.  Willette Alma, AGPCNP-BC  Palliative Medicine Team/[facility]  *Please note that this is a verbal dictation therefore any spelling or grammatical errors are due to the "Dragon Medical One" system interpretation.

## 2023-08-13 NOTE — Assessment & Plan Note (Signed)
-  on HD MF  

## 2023-08-13 NOTE — Assessment & Plan Note (Addendum)
-  lamda light chain disease with renal, cardiac and neuro involvement   -diagnosed in 08/2021 -not a candidate for transplant  --He began weekly oral Cytoxan, dexa and Velcade injection while inpatient on 09/10/21.  He tolerated well. -He began daratumumab injection 09/27/21. Chemo was held briefly following stroke 01/14/22. -not a candidate for biphosphonate due to renal failure -he completed 6 cycle induction chemo with CyBorD on 03/21/22 and moved to monthly Dara maintenance on 03/28/22. Plan to continue for a total of 2 years (24 cycles). -He has had good partial response to treatment based on his M protein and light chain level.

## 2023-08-13 NOTE — Assessment & Plan Note (Signed)
-   fu with cardiology

## 2023-08-14 ENCOUNTER — Encounter: Payer: Self-pay | Admitting: Hematology

## 2023-08-14 ENCOUNTER — Telehealth: Payer: Self-pay

## 2023-08-14 ENCOUNTER — Inpatient Hospital Stay: Payer: Medicare Other | Attending: Hematology | Admitting: *Deleted

## 2023-08-14 ENCOUNTER — Encounter: Payer: Self-pay | Admitting: Nurse Practitioner

## 2023-08-14 ENCOUNTER — Inpatient Hospital Stay (HOSPITAL_BASED_OUTPATIENT_CLINIC_OR_DEPARTMENT_OTHER): Payer: Medicare Other | Admitting: Hematology

## 2023-08-14 ENCOUNTER — Inpatient Hospital Stay: Payer: Medicare Other

## 2023-08-14 ENCOUNTER — Inpatient Hospital Stay (HOSPITAL_BASED_OUTPATIENT_CLINIC_OR_DEPARTMENT_OTHER): Payer: Medicare Other | Admitting: Nurse Practitioner

## 2023-08-14 VITALS — BP 143/90 | HR 74 | Temp 98.2°F | Resp 18 | Wt 146.0 lb

## 2023-08-14 DIAGNOSIS — N186 End stage renal disease: Secondary | ICD-10-CM | POA: Diagnosis not present

## 2023-08-14 DIAGNOSIS — C9000 Multiple myeloma not having achieved remission: Secondary | ICD-10-CM | POA: Diagnosis present

## 2023-08-14 DIAGNOSIS — R630 Anorexia: Secondary | ICD-10-CM | POA: Diagnosis not present

## 2023-08-14 DIAGNOSIS — Z5112 Encounter for antineoplastic immunotherapy: Secondary | ICD-10-CM | POA: Insufficient documentation

## 2023-08-14 DIAGNOSIS — E8581 Light chain (AL) amyloidosis: Secondary | ICD-10-CM

## 2023-08-14 DIAGNOSIS — E854 Organ-limited amyloidosis: Secondary | ICD-10-CM

## 2023-08-14 DIAGNOSIS — G893 Neoplasm related pain (acute) (chronic): Secondary | ICD-10-CM | POA: Diagnosis not present

## 2023-08-14 DIAGNOSIS — I43 Cardiomyopathy in diseases classified elsewhere: Secondary | ICD-10-CM | POA: Insufficient documentation

## 2023-08-14 DIAGNOSIS — D638 Anemia in other chronic diseases classified elsewhere: Secondary | ICD-10-CM

## 2023-08-14 DIAGNOSIS — Z515 Encounter for palliative care: Secondary | ICD-10-CM | POA: Diagnosis not present

## 2023-08-14 LAB — CMP (CANCER CENTER ONLY)
ALT: 12 U/L (ref 0–44)
AST: 13 U/L — ABNORMAL LOW (ref 15–41)
Albumin: 3.3 g/dL — ABNORMAL LOW (ref 3.5–5.0)
Alkaline Phosphatase: 77 U/L (ref 38–126)
Anion gap: 12 (ref 5–15)
BUN: 65 mg/dL — ABNORMAL HIGH (ref 8–23)
CO2: 21 mmol/L — ABNORMAL LOW (ref 22–32)
Calcium: 8.7 mg/dL — ABNORMAL LOW (ref 8.9–10.3)
Chloride: 101 mmol/L (ref 98–111)
Creatinine: 7.29 mg/dL (ref 0.61–1.24)
GFR, Estimated: 7 mL/min — ABNORMAL LOW (ref >60)
Glucose, Bld: 90 mg/dL (ref 70–99)
Potassium: 5.4 mmol/L — ABNORMAL HIGH (ref 3.5–5.1)
Sodium: 134 mmol/L — ABNORMAL LOW (ref 135–145)
Total Bilirubin: 0.4 mg/dL (ref 0.3–1.2)
Total Protein: 6.1 g/dL — ABNORMAL LOW (ref 6.5–8.1)

## 2023-08-14 LAB — CBC WITH DIFFERENTIAL/PLATELET
Abs Immature Granulocytes: 0.03 10*3/uL (ref 0.00–0.07)
Basophils Absolute: 0.1 10*3/uL (ref 0.0–0.1)
Basophils Relative: 1 %
Eosinophils Absolute: 0.3 10*3/uL (ref 0.0–0.5)
Eosinophils Relative: 3 %
HCT: 30.5 % — ABNORMAL LOW (ref 39.0–52.0)
Hemoglobin: 9.8 g/dL — ABNORMAL LOW (ref 13.0–17.0)
Immature Granulocytes: 0 %
Lymphocytes Relative: 19 %
Lymphs Abs: 1.7 10*3/uL (ref 0.7–4.0)
MCH: 30.8 pg (ref 26.0–34.0)
MCHC: 32.1 g/dL (ref 30.0–36.0)
MCV: 95.9 fL (ref 80.0–100.0)
Monocytes Absolute: 0.6 10*3/uL (ref 0.1–1.0)
Monocytes Relative: 7 %
Neutro Abs: 6.5 10*3/uL (ref 1.7–7.7)
Neutrophils Relative %: 70 %
Platelets: 156 10*3/uL (ref 150–400)
RBC: 3.18 MIL/uL — ABNORMAL LOW (ref 4.22–5.81)
RDW: 13.9 % (ref 11.5–15.5)
WBC: 9.2 10*3/uL (ref 4.0–10.5)
nRBC: 0 % (ref 0.0–0.2)

## 2023-08-14 LAB — FERRITIN: Ferritin: 464 ng/mL — ABNORMAL HIGH (ref 24–336)

## 2023-08-14 MED ORDER — DARATUMUMAB-HYALURONIDASE-FIHJ 1800-30000 MG-UT/15ML ~~LOC~~ SOLN
1800.0000 mg | Freq: Once | SUBCUTANEOUS | Status: AC
  Administered 2023-08-14: 1800 mg via SUBCUTANEOUS
  Filled 2023-08-14: qty 15

## 2023-08-14 MED ORDER — ACETAMINOPHEN 325 MG PO TABS
650.0000 mg | ORAL_TABLET | Freq: Once | ORAL | Status: AC
  Administered 2023-08-14: 650 mg via ORAL
  Filled 2023-08-14: qty 2

## 2023-08-14 MED ORDER — DIPHENHYDRAMINE HCL 25 MG PO CAPS
50.0000 mg | ORAL_CAPSULE | Freq: Once | ORAL | Status: AC
  Administered 2023-08-14: 50 mg via ORAL
  Filled 2023-08-14: qty 2

## 2023-08-14 NOTE — Progress Notes (Signed)
[facility]   Telephone:(336) [phone] Fax:(336) [phone]   Clinic Follow up Note   Patient Care Team: [name], MD as PCP - General (Hematology) Buntin, Daisy Floro, RN (Inactive) as Registered Nurse Pickenpack-Cousar, Arty Baumgartner, NP as Nurse Practitioner (Nurse Practitioner) [name], MD as Attending Physician (Hematology and Oncology)  Date of Service:  08/14/2023  CHIEF COMPLAINT: f/u of multiorgan AL amyloidosis      CURRENT THERAPY:   Myeloma Daratumumab SQ q28d   ASSESSMENT:  Caleb Simmons is a 72 y.o. 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  He tolerated well. -He began daratumumab injection 09/27/21. Chemo was held briefly following stroke 01/14/22. -not a candidate for biphosphonate due to renal failure -he completed 6 cycle induction chemo with CyBorD on 03/21/22 and moved to monthly Dara maintenance on 03/28/22. Plan to continue for a total of 2 years (24 cycles). -He has had good partial response to treatment based on his M protein and light chain level. -He is clinically doing well, tolerating room and well.  Will proceed daratumumab and dexamethasone today, his last injection will be next months. -We discussed the surveillance plan after he completes 2 years of treatment, will see him every 3 months with lab.   PLAN: -continue Dara injection -Final Dara injection  in 08/2023 -lab/flush and f/u 9/19    INTERVAL HISTORY:  Caleb Simmons is here for a follow up of multiorgan AL amyloidosis  . He was last seen by me on 05/22/2023. He presents to the clinic accompanied by wife. Pt state that  is going good. His appetite is good. He denies having any issues  clinically doing well. Pt denies having any pain. He denies having any cough and or chest pain.     All other systems were reviewed with the patient and are negative.  MEDICAL HISTORY:  Past Medical History:  Diagnosis Date   Cancer [facility])     SURGICAL HISTORY: Past Surgical History:  Procedure Laterality Date   APPENDECTOMY     IR ANGIO INTRA EXTRACRAN SEL COM CAROTID INNOMINATE BILAT MOD SED  01/16/2022   IR ANGIO INTRA EXTRACRAN SEL COM CAROTID INNOMINATE BILAT MOD SED  07/16/2022   IR ANGIO VERTEBRAL SEL SUBCLAVIAN INNOMINATE BILAT MOD SED  01/16/2022   IR ANGIO VERTEBRAL SEL SUBCLAVIAN INNOMINATE UNI R MOD SED  07/16/2022   IR ANGIO VERTEBRAL SEL VERTEBRAL UNI L MOD SED  07/16/2022   IR CT HEAD LTD  01/18/2022   IR EMBO ART  VEN HEMORR LYMPH EXTRAV  INC GUIDE ROADMAPPING  08/30/2021   IR FLUORO GUIDE CV LINE RIGHT  08/30/2021   IR FLUORO GUIDE CV LINE RIGHT  09/18/2021   IR FLUORO GUIDE CV LINE RIGHT  05/02/2022   IR PERCUTANEOUS ART THROMBECTOMY/INFUSION INTRACRANIAL INC DIAG ANGIO  01/18/2022   IR RADIOLOGIST EVAL & MGMT  08/02/2022   IR RENAL SELECTIVE  UNI INC S&I MOD SED  08/31/2021   IR US GUIDE VASC ACCESS RIGHT  08/30/2021   IR US GUIDE VASC ACCESS RIGHT  08/30/2021   IR US GUIDE VASC ACCESS RIGHT  09/18/2021   IR US GUIDE VASC ACCESS RIGHT  01/16/2022   IR US GUIDE VASC ACCESS RIGHT  07/16/2022   IR VENOCAVAGRAM SVC  05/02/2022   RADIOLOGY WITH ANESTHESIA N/A 01/18/2022   Procedure: Cerebral angioplasty with possible stenting;  Surgeon: Julieanne Cotton, MD;  Location: [facility] OR;  Service: Radiology;  Laterality: N/A;    I have reviewed the social history and family history with the patient and they are unchanged from previous note.  ALLERGIES:  has No Known Allergies.  MEDICATIONS:  Current Outpatient Medications  Medication Sig Dispense Refill   acetaminophen (TYLENOL) 500 MG tablet Take 1,000 mg by mouth every 6 (six) hours as needed for moderate pain.     ALPRAZolam (XANAX) 1 MG  tablet Take 1 tablet (1 mg total) by mouth every 8 (eight) hours as needed for anxiety or sleep. 90 tablet 0   aspirin 81 MG EC tablet Take 1 tablet (81 mg total) by mouth daily. Swallow whole. 30 tablet 11   cetirizine (ZYRTEC) 10 MG tablet Take 10 mg by mouth daily.     dexamethasone (DECADRON) 4 MG tablet Take 5 tablets (20 mg total) by mouth once a week. Take 1-2 hour before chemo injection 30 tablet 1   diclofenac Sodium (VOLTAREN) 1 % GEL Apply 1 Application topically 4 (four) times daily as needed (pain).     dronabinol (MARINOL) 2.5 MG capsule Take 1 capsule (2.5 mg total) by mouth daily in the afternoon. 30 capsule 0   ezetimibe (ZETIA) 10 MG tablet Take 1 tablet (10 mg total) by mouth daily. 90 tablet 3   folic acid (FOLVITE) 1 MG tablet TAKE 2 TABLETS BY MOUTH EVERY DAY 60 tablet 0   Melatonin 10 MG TABS Take 10 mg by mouth at bedtime.     metoprolol succinate (TOPROL-XL) 25 MG 24 hr tablet TAKE 1/2 TABLET BY MOUTH EVERY DAY 15 tablet 4   midodrine (PROAMATINE) 5 MG tablet TAKE 1 TABLET BY MOUTH 3 TIMES DAILY WITH MEALS 270 tablet 3   mirtazapine (REMERON) 30 MG tablet TAKE 1 TABLET BY MOUTH AT BEDTIME 30 tablet 1   ondansetron (ZOFRAN) 4 MG tablet TAKE 1 TABLET BY MOUTH EVERY 8 HOURS AS NEEDED FOR NAUSEA AND VOMITING 30 tablet 2   oxyCODONE (ROXICODONE) 15 MG immediate release tablet Take 1 tablet (15 mg total) by mouth every 6 (six) hours as needed. 84 tablet 0   pantoprazole (PROTONIX) 40 MG tablet TAKE 1 TABLET BY MOUTH 2 TIMES DAILY BEFORE MEALS 60 tablet 0   prochlorperazine (COMPAZINE) 5 MG tablet Take 1 tablet (5 mg total) by mouth every 6 (six) hours as needed for nausea or vomiting. 30 tablet 2   STIMULANT LAXATIVE 8.6-50 MG tablet TAKE 1 TABLET BY MOUTH EVERY DAY 30 tablet 6   tamsulosin (FLOMAX) 0.4 MG CAPS capsule TAKE 1 CAPSULE BY MOUTH EVERY DAY 30 capsule 0   zolpidem (AMBIEN) 5 MG tablet Take 1 tablet (5 mg total) by mouth at bedtime as needed. for sleep 30 tablet 0    zolpidem (AMBIEN) 5 MG tablet TAKE 1 TABLET BY MOUTH NIGHTLY AT BEDTIME AS NEEDED FOR SLEEP 30 tablet 1   No current facility-administered medications for this visit.   Facility-Administered Medications Ordered in Other Visits  Medication Dose Route Frequency Provider Last Rate Last Admin   0.9 %  sodium chloride infusion (Manually program via Guardrails IV Fluids)  250 mL Intravenous Once [name], MD       heparin lock flush 100 unit/mL  500 Units Intracatheter Once  [name], MD       sodium chloride flush (NS) 0.9 % injection 10 mL  10 mL Intracatheter Once [name], MD        PHYSICAL EXAMINATION: ECOG PERFORMANCE STATUS: 2 - Symptomatic, <50% confined to bed  Vitals:   08/14/23 0827  BP: (!) 143/90  Pulse: 74  Resp: 18  Temp: 98.2 F (36.8 C)  SpO2: 99%   Wt Readings from Last 3 Encounters:  08/14/23 146 lb (66.2 kg)  07/17/23 140 lb (63.5 kg)  06/19/23 139 lb (63 kg)     GENERAL:alert, no distress and comfortable SKIN: skin color normal, no rashes or significant lesions EYES: normal, Conjunctiva are pink and non-injected, sclera clear  NEURO: alert & oriented x 3 with fluent speech  LUNGS: (-) clear to auscultation and percussion with normal breathing effort HEART: (-)regular rate & rhythm and no murmurs and (-)no lower extremity edema   LABORATORY DATA:  I have reviewed the data as listed    Latest Ref Rng & Units 08/14/2023    8:00 AM 07/17/2023    8:13 AM 06/19/2023    7:59 AM  CBC  WBC 4.0 - 10.5 K/uL 9.2  6.2  5.3   Hemoglobin 13.0 - 17.0 g/dL 9.8  9.4  9.1   Hematocrit 39.0 - 52.0 % 30.5  29.0  28.2   Platelets 150 - 400 K/uL 156  152  131         Latest Ref Rng & Units 07/17/2023    8:13 AM 06/19/2023    7:59 AM 05/22/2023    7:57 AM  CMP  Glucose 70 - 99 mg/dL 85  86  725   BUN 8 - 23 mg/dL 49  46  42   Creatinine 0.61 - 1.24 mg/dL 3.66  4.40  3.47   Sodium 135 - 145 mmol/L 136  137  137   Potassium 3.5 - 5.1 mmol/L 4.9  5.0  4.6   Chloride  98 - 111 mmol/L 101  102  103   CO2 22 - 32 mmol/L 25  25  26    Calcium 8.9 - 10.3 mg/dL 8.5  8.0  8.1   Total Protein 6.5 - 8.1 g/dL 5.6  5.6  5.8   Total Bilirubin 0.3 - 1.2 mg/dL 0.3  0.3  0.3   Alkaline Phos 38 - 126 U/L 88  74  84   AST 15 - 41 U/L 11  11  10    ALT 0 - 44 U/L 8  7  7        RADIOGRAPHIC STUDIES: I have personally reviewed the radiological images as listed and agreed with the findings in the report. No results found.    No orders of the defined types were placed in this encounter.  All questions were answered. The patient knows to call the clinic with any problems, questions or concerns. No barriers to learning was detected. The total time spent in the appointment was 25 minutes.     [name], MD 08/14/2023   Carolin Coy, CMA, am acting as scribe for [name], MD.   I have reviewed the above documentation for accuracy and completeness, and I agree with the above.

## 2023-08-14 NOTE — Progress Notes (Signed)
Critical Creatinine 7.29 called and read back to Janett Billow, LPN at 1191 am by Para Skeans (MT) 08/14/2023

## 2023-08-14 NOTE — Telephone Encounter (Signed)
Critical lab value reported: Scr+ 7.29  ESRD pt.  Dr. Mosetta Putt notified.

## 2023-08-14 NOTE — Patient Instructions (Signed)

## 2023-08-20 ENCOUNTER — Other Ambulatory Visit (HOSPITAL_COMMUNITY): Payer: Self-pay | Admitting: Cardiology

## 2023-08-20 ENCOUNTER — Other Ambulatory Visit: Payer: Self-pay | Admitting: Hematology

## 2023-08-20 ENCOUNTER — Other Ambulatory Visit: Payer: Self-pay | Admitting: Nurse Practitioner

## 2023-08-26 ENCOUNTER — Other Ambulatory Visit: Payer: Self-pay | Admitting: Nurse Practitioner

## 2023-08-26 DIAGNOSIS — Z515 Encounter for palliative care: Secondary | ICD-10-CM

## 2023-08-26 DIAGNOSIS — G893 Neoplasm related pain (acute) (chronic): Secondary | ICD-10-CM

## 2023-08-26 DIAGNOSIS — E854 Organ-limited amyloidosis: Secondary | ICD-10-CM

## 2023-08-26 DIAGNOSIS — E8581 Light chain (AL) amyloidosis: Secondary | ICD-10-CM

## 2023-08-26 MED ORDER — OXYCODONE HCL 15 MG PO TABS
15.0000 mg | ORAL_TABLET | Freq: Four times a day (QID) | ORAL | 0 refills | Status: DC | PRN
Start: 2023-08-26 — End: 2023-09-09

## 2023-09-04 ENCOUNTER — Encounter: Payer: Self-pay | Admitting: Hematology

## 2023-09-08 ENCOUNTER — Other Ambulatory Visit: Payer: Self-pay | Admitting: Hematology

## 2023-09-08 ENCOUNTER — Other Ambulatory Visit: Payer: Self-pay | Admitting: Nurse Practitioner

## 2023-09-08 DIAGNOSIS — E8581 Light chain (AL) amyloidosis: Secondary | ICD-10-CM

## 2023-09-08 DIAGNOSIS — Z515 Encounter for palliative care: Secondary | ICD-10-CM

## 2023-09-08 DIAGNOSIS — E854 Organ-limited amyloidosis: Secondary | ICD-10-CM

## 2023-09-08 DIAGNOSIS — F419 Anxiety disorder, unspecified: Secondary | ICD-10-CM

## 2023-09-08 DIAGNOSIS — G893 Neoplasm related pain (acute) (chronic): Secondary | ICD-10-CM

## 2023-09-09 ENCOUNTER — Encounter: Payer: Self-pay | Admitting: Hematology

## 2023-09-09 NOTE — Progress Notes (Signed)
Palliative Medicine [facility]  Telephone:(336) [phone] Fax:(336) [phone]   Name: Caleb Simmons Date: 09/09/2023 MRN: [id number]  DOB: 1951/11/30  Patient Care Team: [name], MD as PCP - General (Hematology) Buntin, Daisy Floro, RN (Inactive) as Registered Nurse Pickenpack-Cousar, Arty Baumgartner, NP as Nurse Practitioner (Nurse Practitioner) [name], MD as Attending Physician (Hematology and Oncology)    INTERVAL HISTORY: Caleb Simmons is a 72 y.o. 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  Recently admitted and discharged on 01/20/22 after receiving treatment for left MCA CVA. Palliative following for ongoing goals of care discussions and symptom management.   SOCIAL HISTORY:     reports that he has been smoking cigarettes. He has a 6.3 pack-year smoking history. He has never used smokeless tobacco.  ADVANCE DIRECTIVES:  Patient reports completed document.  Son Caleb, Simmons. is his healthcare power of attorney.  CODE STATUS: Full code  PAST MEDICAL HISTORY: Past Medical History:  Diagnosis Date   Cancer (HCC)     ALLERGIES:  has No Known Allergies.  MEDICATIONS:  Current Outpatient Medications  Medication Sig Dispense Refill   acetaminophen (TYLENOL) 500 MG tablet Take 1,000 mg by mouth every 6 (six) hours as needed for moderate pain.     ALPRAZolam (XANAX) 1 MG tablet Take 1 tablet (1 mg total) by mouth every 8 (eight) hours as needed for anxiety or sleep. 90 tablet 0   aspirin 81 MG EC tablet Take 1 tablet (81 mg total) by mouth daily. Swallow whole. 30 tablet 11   cetirizine (ZYRTEC) 10 MG tablet Take 10 mg by mouth daily.     dexamethasone (DECADRON) 4 MG tablet Take 5 tablets (20 mg total) by mouth once a week. Take 1-2 hour before chemo injection 30 tablet 1   diclofenac Sodium (VOLTAREN) 1 % GEL Apply 1 Application  topically 4 (four) times daily as needed (pain).     dronabinol (MARINOL) 2.5 MG capsule Take 1 capsule (2.5 mg total) by mouth daily in the afternoon. 30 capsule 0   ezetimibe (ZETIA) 10 MG tablet Take 1 tablet (10 mg total) by mouth daily. 90 tablet 3   folic acid (FOLVITE) 1 MG tablet TAKE 2 TABLETS BY MOUTH EVERY DAY 60 tablet 0   Melatonin 10 MG TABS Take 10 mg by mouth at bedtime.     metoprolol succinate (TOPROL-XL) 25 MG 24 hr tablet TAKE 1/2 TABLET BY MOUTH EVERY DAY 15 tablet 4   midodrine (PROAMATINE) 5 MG tablet TAKE 1 TABLET BY MOUTH 3 TIMES DAILY WITH MEALS 270 tablet 3   mirtazapine (REMERON) 30 MG tablet TAKE 1 TABLET BY MOUTH AT BEDTIME 30 tablet 1   ondansetron (ZOFRAN) 4 MG tablet TAKE 1 TABLET BY MOUTH EVERY 8 HOURS AS NEEDED FOR NAUSEA AND VOMITING 30 tablet 2   oxyCODONE (ROXICODONE) 15 MG immediate release tablet Take 1 tablet (15 mg total) by mouth every 6 (six) hours as needed. 84 tablet 0   pantoprazole (PROTONIX) 40 MG tablet TAKE 1 TABLET BY MOUTH 2 TIMES DAILY BEFORE MEALS 60 tablet 0   prochlorperazine (COMPAZINE) 5 MG tablet TAKE 1 TABLET BY MOUTH EVERY 6 HOURS AS NEEDED FOR NAUSEA AND VOMITING 30 tablet 2   STIMULANT LAXATIVE 8.6-50 MG tablet TAKE 1 TABLET BY MOUTH EVERY DAY 30 tablet 6   tamsulosin (FLOMAX) 0.4 MG CAPS capsule TAKE 1  CAPSULE BY MOUTH EVERY DAY 30 capsule 0   zolpidem (AMBIEN) 5 MG tablet Take 1 tablet (5 mg total) by mouth at bedtime as needed. for sleep 30 tablet 0   zolpidem (AMBIEN) 5 MG tablet TAKE 1 TABLET BY MOUTH NIGHTLY AT BEDTIME AS NEEDED FOR SLEEP 30 tablet 1   No current facility-administered medications for this visit.   Facility-Administered Medications Ordered in Other Visits  Medication Dose Route Frequency Provider Last Rate Last Admin   0.9 %  sodium chloride infusion (Manually program via Guardrails IV Fluids)  250 mL Intravenous Once [name], MD       heparin lock flush 100 unit/mL  500 Units Intracatheter Once [name],  MD       sodium chloride flush (NS) 0.9 % injection 10 mL  10 mL Intracatheter Once [name], MD        VITAL SIGNS: There were no vitals taken for this visit. There were no vitals filed for this visit.    Estimated body mass index is 20.95 kg/m as calculated from the following:   Height as of 05/22/23: 5\' 10"  (1.778 m).   Weight as of 08/14/23: 146 lb (66.2 kg).   PERFORMANCE STATUS (ECOG) : 2 - Symptomatic, <50% confined to bed  Assessment NAD, in wheelchair  RRR Normal breathing pattern Skin dry, flaky, ulcerations AAO x3  IMPRESSION:  Caleb Simmons presents to clinic for follow-up. No acute distress. Caleb Simmons is present. Denies nausea, vomiting, constipation, or diarrhea. Occasional fatigue. Dialysis every Monday and Friday. He was seen by Dr. Mosetta Putt today. Patient has some dry patches to face. Lesions to arm and leg. He is being referred to Dermatology for evaluation.   Pain Bowie reports his pain is well controlled. Is taking oxycodone as needed.  Tolerating without difficulty.  Does not take around-the-clock.   We will continue to closely monitor.    Decreased Appetite Appetite fluctuates. Some days are better than others. Current weight is 144lbs this is down from 146lbs. 140 lbs on 7/25, 139lbs on 6/27lbs.   PLAN: Oxy IR 5-10 mg every 4-6 hours as needed for breakthrough pain Appetite continues to be a challenge but slowly improving. Marinol 2.5mg  Followed by Lacie Draft.  Xanax 0.5 mg every 8 hours as needed for anxiety/sleep Miralax twice daily Senna-S daily Zofran as needed for nausea.  I will plan to see him back in 3-4 weeks in collaboration with his other oncology appointments.   Patient expressed understanding and was in agreement with this plan. He also understands that He can call the clinic at any time with any questions, concerns, or complaints.     Any controlled substances utilized were prescribed in the context of palliative care. PDMP has been reviewed.     Visit consisted of counseling and education dealing with the complex and emotionally intense issues of symptom management and palliative care in the setting of serious and potentially life-threatening illness.Greater than 50%  of this time was spent counseling and coordinating care related to the above assessment and plan.  Willette Alma, AGPCNP-BC  Palliative Medicine Team/[facility]  *Please note that this is a verbal dictation therefore any spelling or grammatical errors are due to the "[employer]agon [employer] One" system interpretation.

## 2023-09-11 ENCOUNTER — Inpatient Hospital Stay (HOSPITAL_BASED_OUTPATIENT_CLINIC_OR_DEPARTMENT_OTHER): Payer: Medicare HMO | Admitting: Nurse Practitioner

## 2023-09-11 ENCOUNTER — Telehealth: Payer: Self-pay

## 2023-09-11 ENCOUNTER — Encounter: Payer: Self-pay | Admitting: Hematology

## 2023-09-11 ENCOUNTER — Inpatient Hospital Stay: Payer: Medicare HMO | Attending: Hematology

## 2023-09-11 ENCOUNTER — Inpatient Hospital Stay (HOSPITAL_BASED_OUTPATIENT_CLINIC_OR_DEPARTMENT_OTHER): Payer: Medicare HMO | Admitting: Hematology

## 2023-09-11 ENCOUNTER — Encounter: Payer: Self-pay | Admitting: Nurse Practitioner

## 2023-09-11 ENCOUNTER — Inpatient Hospital Stay: Payer: Medicare HMO

## 2023-09-11 VITALS — BP 160/91 | HR 78 | Temp 98.3°F | Resp 18 | Ht 70.0 in | Wt 144.9 lb

## 2023-09-11 DIAGNOSIS — Z7982 Long term (current) use of aspirin: Secondary | ICD-10-CM | POA: Diagnosis not present

## 2023-09-11 DIAGNOSIS — N186 End stage renal disease: Secondary | ICD-10-CM | POA: Diagnosis not present

## 2023-09-11 DIAGNOSIS — R630 Anorexia: Secondary | ICD-10-CM

## 2023-09-11 DIAGNOSIS — C9000 Multiple myeloma not having achieved remission: Secondary | ICD-10-CM | POA: Diagnosis present

## 2023-09-11 DIAGNOSIS — Z515 Encounter for palliative care: Secondary | ICD-10-CM | POA: Diagnosis not present

## 2023-09-11 DIAGNOSIS — F17210 Nicotine dependence, cigarettes, uncomplicated: Secondary | ICD-10-CM | POA: Insufficient documentation

## 2023-09-11 DIAGNOSIS — G893 Neoplasm related pain (acute) (chronic): Secondary | ICD-10-CM | POA: Diagnosis not present

## 2023-09-11 DIAGNOSIS — Z79899 Other long term (current) drug therapy: Secondary | ICD-10-CM | POA: Insufficient documentation

## 2023-09-11 DIAGNOSIS — E8581 Light chain (AL) amyloidosis: Secondary | ICD-10-CM | POA: Diagnosis not present

## 2023-09-11 DIAGNOSIS — Z5112 Encounter for antineoplastic immunotherapy: Secondary | ICD-10-CM | POA: Diagnosis present

## 2023-09-11 DIAGNOSIS — Z992 Dependence on renal dialysis: Secondary | ICD-10-CM | POA: Insufficient documentation

## 2023-09-11 DIAGNOSIS — R634 Abnormal weight loss: Secondary | ICD-10-CM

## 2023-09-11 DIAGNOSIS — R530 Neoplastic (malignant) related fatigue: Secondary | ICD-10-CM

## 2023-09-11 LAB — COMPREHENSIVE METABOLIC PANEL
ALT: 8 U/L (ref 0–44)
AST: 10 U/L — ABNORMAL LOW (ref 15–41)
Albumin: 3.7 g/dL (ref 3.5–5.0)
Alkaline Phosphatase: 84 U/L (ref 38–126)
Anion gap: 11 (ref 5–15)
BUN: 62 mg/dL — ABNORMAL HIGH (ref 8–23)
CO2: 23 mmol/L (ref 22–32)
Calcium: 8.6 mg/dL — ABNORMAL LOW (ref 8.9–10.3)
Chloride: 102 mmol/L (ref 98–111)
Creatinine, Ser: 7.86 mg/dL (ref 0.61–1.24)
GFR, Estimated: 7 mL/min — ABNORMAL LOW (ref >60)
Glucose, Bld: 129 mg/dL — ABNORMAL HIGH (ref 70–99)
Potassium: 4.6 mmol/L (ref 3.5–5.1)
Sodium: 136 mmol/L (ref 135–145)
Total Bilirubin: 0.4 mg/dL (ref 0.3–1.2)
Total Protein: 6.2 g/dL — ABNORMAL LOW (ref 6.5–8.1)

## 2023-09-11 LAB — CBC WITH DIFFERENTIAL/PLATELET
Abs Immature Granulocytes: 0.02 10*3/uL (ref 0.00–0.07)
Basophils Absolute: 0.1 10*3/uL (ref 0.0–0.1)
Basophils Relative: 1 %
Eosinophils Absolute: 0.3 10*3/uL (ref 0.0–0.5)
Eosinophils Relative: 3 %
HCT: 29.8 % — ABNORMAL LOW (ref 39.0–52.0)
Hemoglobin: 9.8 g/dL — ABNORMAL LOW (ref 13.0–17.0)
Immature Granulocytes: 0 %
Lymphocytes Relative: 25 %
Lymphs Abs: 2 10*3/uL (ref 0.7–4.0)
MCH: 31.1 pg (ref 26.0–34.0)
MCHC: 32.9 g/dL (ref 30.0–36.0)
MCV: 94.6 fL (ref 80.0–100.0)
Monocytes Absolute: 0.5 10*3/uL (ref 0.1–1.0)
Monocytes Relative: 6 %
Neutro Abs: 5.2 10*3/uL (ref 1.7–7.7)
Neutrophils Relative %: 65 %
Platelets: 170 10*3/uL (ref 150–400)
RBC: 3.15 MIL/uL — ABNORMAL LOW (ref 4.22–5.81)
RDW: 14 % (ref 11.5–15.5)
WBC: 8.1 10*3/uL (ref 4.0–10.5)
nRBC: 0 % (ref 0.0–0.2)

## 2023-09-11 MED ORDER — DIPHENHYDRAMINE HCL 25 MG PO CAPS
50.0000 mg | ORAL_CAPSULE | Freq: Once | ORAL | Status: AC
  Administered 2023-09-11: 50 mg via ORAL
  Filled 2023-09-11: qty 2

## 2023-09-11 MED ORDER — DARATUMUMAB-HYALURONIDASE-FIHJ 1800-30000 MG-UT/15ML ~~LOC~~ SOLN
1800.0000 mg | Freq: Once | SUBCUTANEOUS | Status: AC
  Administered 2023-09-11: 1800 mg via SUBCUTANEOUS
  Filled 2023-09-11: qty 15

## 2023-09-11 MED ORDER — ACETAMINOPHEN 325 MG PO TABS
650.0000 mg | ORAL_TABLET | Freq: Once | ORAL | Status: AC
  Administered 2023-09-11: 650 mg via ORAL
  Filled 2023-09-11: qty 2

## 2023-09-11 NOTE — Progress Notes (Signed)
Pt states he took decadron at home prior to arriving for injection.

## 2023-09-11 NOTE — Telephone Encounter (Signed)
Critical Lab Value reported by lab:  Scr+ 7.86  Pt has ESRD.  Dr. Mosetta Putt made aware.

## 2023-09-11 NOTE — Progress Notes (Signed)
[facility]   [phone] [phone]   Clinic Follow up Note   Patient Care Team: [name], MD as PCP - General (Hematology) Buntin, Daisy Floro, RN (Inactive) as Registered Nurse Pickenpack-Cousar, Arty Baumgartner, NP as Nurse Practitioner (Nurse Practitioner) [name], MD as Attending Physician (Hematology and Oncology)  Date of Service:  09/11/2023  CHIEF COMPLAINT: f/u of AL amyloidosis   ASSESSMENT:  Caleb Simmons is a 72 y.o. 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  He tolerated well. -He began daratumumab injection 09/27/21. Chemo was held briefly following stroke 01/14/22. -not a candidate for biphosphonate due to renal failure -he completed 6 cycle induction chemo with CyBorD on 03/21/22 and moved to monthly Dara maintenance on 03/28/22. Plan to continue for a total of 2 years (24 cycles). -He has had good partial response to treatment based on his M protein and light chain level.     Skin Lesions Multiple skin lesions present on the head, nose, and arms. Lesions are itchy and become sores when scratched. One larger lesion on the leg that bleeds when touched. Patient has been using Neosporin with no improvement. -Refer to dermatology for further evaluation and management. -Advise patient to try over-the-counter hydrocortisone cream. -Continue daily baths and use of scrubber for hygiene. -Continue use of Neosporin on open sores.   ESRD Patient on dialysis twice a week due to irreversible kidney damage. -Continue dialysis as scheduled. -Continue follow-up with nephrologist.   General Health Maintenance -Continue current care with palliative care team. -Encourage patient to establish care with a primary care physician.        INTERVAL  HISTORY:  The patient, with a history of AL amyloidosis affecting the heart, kidney, and bone marrow, presents with multiple skin lesions. The lesions are described as white patches that turn into sores when scratched. They are located on the patient's hairline, nose, and arms. The patient also has a larger, bleeding lesion on his leg. The patient has been using Neosporin for the sores, but reports no improvement. He has not tried hydrocortisone cream.  The patient is currently on maintenance therapy with daratumumab for his AL amyloidosis, which is planned to continue until April 2025. The patient reports that his condition is stable and he is undergoing dialysis twice a week. He also sees a cardiologist and reports no current issues with low blood pressure. The patient's appetite is described as fluctuating.    All other systems were reviewed with the patient and are negative.  MEDICAL HISTORY:  Past Medical History:  Diagnosis Date   Cancer [facility])     SURGICAL HISTORY: Past Surgical History:  Procedure Laterality Date   APPENDECTOMY     IR ANGIO INTRA EXTRACRAN SEL COM CAROTID INNOMINATE BILAT MOD SED  01/16/2022   IR ANGIO INTRA EXTRACRAN SEL COM CAROTID INNOMINATE BILAT MOD SED  07/16/2022   IR ANGIO VERTEBRAL SEL SUBCLAVIAN INNOMINATE BILAT MOD SED  01/16/2022   IR ANGIO VERTEBRAL SEL SUBCLAVIAN INNOMINATE UNI R MOD SED  07/16/2022   IR ANGIO VERTEBRAL SEL VERTEBRAL UNI L MOD SED  07/16/2022   IR CT HEAD LTD  01/18/2022   IR EMBO ART  VEN HEMORR LYMPH EXTRAV  INC GUIDE ROADMAPPING  08/30/2021   IR FLUORO GUIDE CV LINE RIGHT  08/30/2021   IR FLUORO GUIDE CV LINE RIGHT  09/18/2021   IR FLUORO GUIDE CV LINE RIGHT  05/02/2022   IR PERCUTANEOUS ART THROMBECTOMY/INFUSION INTRACRANIAL INC DIAG ANGIO  01/18/2022   IR RADIOLOGIST EVAL & MGMT  08/02/2022   IR RENAL SELECTIVE  UNI INC S&I MOD SED  08/31/2021   IR US GUIDE VASC ACCESS RIGHT  08/30/2021   IR US GUIDE VASC ACCESS RIGHT  08/30/2021   IR US GUIDE  VASC ACCESS RIGHT  09/18/2021   IR US GUIDE VASC ACCESS RIGHT  01/16/2022   IR US GUIDE VASC ACCESS RIGHT  07/16/2022   IR VENOCAVAGRAM SVC  05/02/2022   RADIOLOGY WITH ANESTHESIA N/A 01/18/2022   Procedure: Cerebral angioplasty with possible stenting;  Surgeon: Julieanne Cotton, MD;  Location: [facility] OR;  Service: Radiology;  Laterality: N/A;    I have reviewed the social history and family history with the patient and they are unchanged from previous note.  ALLERGIES:  has No Known Allergies.  MEDICATIONS:  Current Outpatient Medications  Medication Sig Dispense Refill   acetaminophen (TYLENOL) 500 MG tablet Take 1,000 mg by mouth every 6 (six) hours as needed for moderate pain.     ALPRAZolam (XANAX) 1 MG tablet TAKE 1 TABLET BY MOUTH EVERY 8 HOURS AS NEEDED FOR ANXIETY OR SLEEP 90 tablet 0   aspirin 81 MG EC tablet Take 1 tablet (81 mg total) by mouth daily. Swallow whole. 30 tablet 11   cetirizine (ZYRTEC) 10 MG tablet Take 10 mg by mouth daily.     dexamethasone (DECADRON) 4 MG tablet Take 5 tablets (20 mg total) by mouth once a week. Take 1-2 hour before chemo injection 30 tablet 1   diclofenac Sodium (VOLTAREN) 1 % GEL Apply 1 Application topically 4 (four) times daily as needed (pain).     dronabinol (MARINOL) 2.5 MG capsule Take 1 capsule (2.5 mg total) by mouth daily in the afternoon. 30 capsule 0   ezetimibe (ZETIA) 10 MG tablet Take 1 tablet (10 mg total) by mouth daily. 90 tablet 3   folic acid (FOLVITE) 1 MG tablet TAKE 2 TABLETS BY MOUTH EVERY DAY 60 tablet 0   Melatonin 10 MG TABS Take 10 mg by mouth at bedtime.     metoprolol succinate (TOPROL-XL) 25 MG 24 hr tablet TAKE 1/2 TABLET BY MOUTH EVERY DAY 15 tablet 4   midodrine (PROAMATINE) 5 MG tablet TAKE 1 TABLET BY MOUTH 3 TIMES DAILY WITH MEALS 270 tablet 3   mirtazapine (REMERON) 30 MG tablet TAKE 1 TABLET BY MOUTH AT BEDTIME 30 tablet 1   ondansetron (ZOFRAN) 4 MG tablet TAKE 1 TABLET BY MOUTH EVERY 8 HOURS AS NEEDED FOR  NAUSEA AND VOMITING 30 tablet 2   oxyCODONE (ROXICODONE) 15 MG immediate release tablet TAKE 1 TABLET BY MOUTH EVERY 6 HOURS AS NEEDED 84 tablet 0   pantoprazole (PROTONIX) 40 MG tablet TAKE 1 TABLET BY MOUTH 2 TIMES DAILY BEFORE MEALS 60 tablet 0   prochlorperazine (COMPAZINE) 5 MG tablet TAKE 1 TABLET BY MOUTH EVERY 6 HOURS AS NEEDED FOR NAUSEA AND VOMITING 30 tablet 2   STIMULANT LAXATIVE 8.6-50 MG tablet TAKE 1 TABLET BY MOUTH EVERY DAY 30 tablet 6   tamsulosin (FLOMAX) 0.4 MG CAPS capsule TAKE 1 CAPSULE BY MOUTH EVERY DAY 30 capsule 0   zolpidem (AMBIEN) 5 MG tablet Take 1 tablet (5 mg total) by mouth at bedtime as needed. for sleep 30 tablet 0   zolpidem (AMBIEN) 5  MG tablet TAKE 1 TABLET BY MOUTH NIGHTLY AT BEDTIME AS NEEDED FOR SLEEP 30 tablet 1   No current facility-administered medications for this visit.   Facility-Administered Medications Ordered in Other Visits  Medication Dose Route Frequency Provider Last Rate Last Admin   0.9 %  sodium chloride infusion (Manually program via Guardrails IV Fluids)  250 mL Intravenous Once [name], MD       heparin lock flush 100 unit/mL  500 Units Intracatheter Once [name], MD       sodium chloride flush (NS) 0.9 % injection 10 mL  10 mL Intracatheter Once [name], MD        PHYSICAL EXAMINATION: ECOG PERFORMANCE STATUS: 2 - Symptomatic, <50% confined to bed  Vitals:   09/11/23 0840 09/11/23 0841  BP: (!) 162/90 (!) 160/91  Pulse: 78   Resp: 18   Temp: 98.3 F (36.8 C)   SpO2: 99%    Wt Readings from Last 3 Encounters:  09/11/23 144 lb 14.4 oz (65.7 kg)  08/14/23 146 lb (66.2 kg)  07/17/23 140 lb (63.5 kg)     Physical Exam   MEASUREMENTS: WT- 194 CHEST: Lungs clear to auscultation. CARDIOVASCULAR: Heart rhythm regular without abnormal sounds. ABDOMEN: Bowel sounds present, no tenderness. MUSCULOSKELETAL: No back tenderness. SKIN: Multiple skin lesions on head, arms, and leg with white patches that turn into sores upon  scratching. One lesion on leg bleeds.      LABORATORY DATA:  I have reviewed the data as listed    Latest Ref Rng & Units 09/11/2023    8:22 AM 08/14/2023    8:00 AM 07/17/2023    8:13 AM  CBC  WBC 4.0 - 10.5 K/uL 8.1  9.2  6.2   Hemoglobin 13.0 - 17.0 g/dL 9.8  9.8  9.4   Hematocrit 39.0 - 52.0 % 29.8  30.5  29.0   Platelets 150 - 400 K/uL 170  156  152         Latest Ref Rng & Units 08/14/2023    8:00 AM 07/17/2023    8:13 AM 06/19/2023    7:59 AM  CMP  Glucose 70 - 99 mg/dL 90  85  86   BUN 8 - 23 mg/dL 65  49  46   Creatinine 0.61 - 1.24 mg/dL 8.29  5.62  1.30   Sodium 135 - 145 mmol/L 134  136  137   Potassium 3.5 - 5.1 mmol/L 5.4  4.9  5.0   Chloride 98 - 111 mmol/L 101  101  102   CO2 22 - 32 mmol/L 21  25  25    Calcium 8.9 - 10.3 mg/dL 8.7  8.5  8.0   Total Protein 6.5 - 8.1 g/dL 6.1  5.6  5.6   Total Bilirubin 0.3 - 1.2 mg/dL 0.4  0.3  0.3   Alkaline Phos 38 - 126 U/L 77  88  74   AST 15 - 41 U/L 13  11  11    ALT 0 - 44 U/L 12  8  7        RADIOGRAPHIC STUDIES: I have personally reviewed the radiological images as listed and agreed with the findings in the report. No results found.    Orders Placed This Encounter  Procedures   Ambulatory referral to Dermatology    Referral Priority:   Routine    Referral Type:   Consultation    Referral Reason:   Specialty Services Required    Requested Specialty:  Dermatology    Number of Visits Requested:   1   All questions were answered. The patient knows to call the clinic with any problems, questions or concerns. No barriers to learning was detected. The total time spent in the appointment was 25 minutes.     [name], MD 09/11/2023

## 2023-09-11 NOTE — Patient Instructions (Signed)
[facility]  Discharge Instructions: Thank you for choosing [facility] to provide your oncology and hematology care.   If you have a lab appointment with the Cancer Center, please go directly to the Cancer Center and check in at the registration area.   Wear comfortable clothing and clothing appropriate for easy access to any Portacath or PICC line.   We strive to give you quality time with your provider. You may need to reschedule your appointment if you arrive late (15 or more minutes).  Arriving late affects you and other patients whose appointments are after yours.  Also, if you miss three or more appointments without notifying the office, you may be dismissed from the clinic at the provider's discretion.      For prescription refill requests, have your pharmacy contact our office and allow 72 hours for refills to be completed.    Today you received the following chemotherapy and/or immunotherapy agents: Dara Faspro.    To help prevent nausea and vomiting after your treatment, we encourage you to take your nausea medication as directed.  BELOW ARE SYMPTOMS THAT SHOULD BE REPORTED IMMEDIATELY: *FEVER GREATER THAN 100.4 F (38 C) OR HIGHER *CHILLS OR SWEATING *NAUSEA AND VOMITING THAT IS NOT CONTROLLED WITH YOUR NAUSEA MEDICATION *UNUSUAL SHORTNESS OF BREATH *UNUSUAL BRUISING OR BLEEDING *URINARY PROBLEMS (pain or burning when urinating, or frequent urination) *BOWEL PROBLEMS (unusual diarrhea, constipation, pain near the anus) TENDERNESS IN MOUTH AND THROAT WITH OR WITHOUT PRESENCE OF ULCERS (sore throat, sores in mouth, or a toothache) UNUSUAL RASH, SWELLING OR PAIN  UNUSUAL VAGINAL DISCHARGE OR ITCHING   Items with * indicate a potential emergency and should be followed up as soon as possible or go to the Emergency Department if any problems should occur.  Please show the CHEMOTHERAPY ALERT CARD or IMMUNOTHERAPY ALERT CARD at  check-in to the Emergency Department and triage nurse.  Should you have questions after your visit or need to cancel or reschedule your appointment, please contact [facility] [place] [facility]  Dept: [phone]  and follow the prompts.  Office hours are 8:00 a.m. to 4:30 p.m. Monday - Friday. Please note that voicemails left after 4:00 p.m. may not be returned until the following business day.  We are closed weekends and major holidays. You have access to a nurse at all times for urgent questions. Please call the main number to the clinic Dept: [phone] and follow the prompts.   For any non-urgent questions, you may also contact your provider using MyChart. We now offer e-Visits for anyone 75 and older to request care online for non-urgent symptoms. For details visit [email or web address].   Also download the MyChart app! Go to the app store, search "MyChart", open the app, select [place], and log in with your MyChart username and password.

## 2023-09-11 NOTE — Assessment & Plan Note (Signed)
-  lamda light chain disease with renal, cardiac and neuro involvement   -diagnosed in 08/2021 -not a candidate for transplant  --He began weekly oral Cytoxan, dexa and Velcade injection while inpatient on 09/10/21.  He tolerated well. -He began daratumumab injection 09/27/21. Chemo was held briefly following stroke 01/14/22. -not a candidate for biphosphonate due to renal failure -he completed 6 cycle induction chemo with CyBorD on 03/21/22 and moved to monthly Dara maintenance on 03/28/22. Plan to continue for a total of 2 years (24 cycles). -He has had good partial response to treatment based on his M protein and light chain level.

## 2023-09-12 ENCOUNTER — Encounter: Payer: Self-pay | Admitting: Hematology

## 2023-09-17 ENCOUNTER — Other Ambulatory Visit: Payer: Self-pay | Admitting: Hematology

## 2023-09-22 ENCOUNTER — Other Ambulatory Visit: Payer: Self-pay | Admitting: Hematology

## 2023-09-29 ENCOUNTER — Other Ambulatory Visit: Payer: Self-pay | Admitting: Nurse Practitioner

## 2023-09-29 DIAGNOSIS — R630 Anorexia: Secondary | ICD-10-CM

## 2023-09-29 DIAGNOSIS — E854 Organ-limited amyloidosis: Secondary | ICD-10-CM

## 2023-09-29 DIAGNOSIS — Z515 Encounter for palliative care: Secondary | ICD-10-CM

## 2023-09-29 DIAGNOSIS — R634 Abnormal weight loss: Secondary | ICD-10-CM

## 2023-09-29 DIAGNOSIS — E8581 Light chain (AL) amyloidosis: Secondary | ICD-10-CM

## 2023-10-03 ENCOUNTER — Telehealth: Payer: Self-pay | Admitting: Hematology

## 2023-10-03 ENCOUNTER — Other Ambulatory Visit: Payer: Self-pay

## 2023-10-03 ENCOUNTER — Telehealth: Payer: Self-pay

## 2023-10-03 NOTE — Telephone Encounter (Signed)
Pt SO called about pt having stomach pains with nausea. Pt has oxycodone, protonix, compazine and zofran at home. Pt instructed to start Maalox over the weekend to see if it improves. Pt SO also questioning upcoming appts/treatment, message sent to oncology team for clarification.

## 2023-10-05 ENCOUNTER — Other Ambulatory Visit: Payer: Self-pay | Admitting: Hematology

## 2023-10-07 NOTE — Progress Notes (Deleted)
Palliative Medicine [facility]  Telephone:([phone] Fax:(336) [phone]   Name: Caleb Simmons Date: 10/07/2023 MRN: [id number]  DOB: 01-29-1951  Patient Care Team: [name], MD as PCP - General (Hematology) Buntin, Daisy Floro, RN (Inactive) as Registered Nurse Pickenpack-Cousar, Arty Baumgartner, NP as Nurse Practitioner (Nurse Practitioner) [name], MD as Attending Physician (Hematology and Oncology)   I connected with [name] on 10/07/23 at 11:00 AM EDT by phone and verified that I am speaking with the correct person using two identifiers.   I discussed the limitations, risks, security and privacy concerns of performing an evaluation and management service by telemedicine and the availability of in-person appointments. I also discussed with the patient that there may be a patient responsible charge related to this service. The patient expressed understanding and agreed to proceed.   Other persons participating in the visit and their role in the encounter: Everlean Patterson (SO)   Patient's location: home  Provider's location: [facility]   Chief Complaint: f/u of symptom management    INTERVAL HISTORY: POL HODO is a 72 y.o. 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  Recently admitted and discharged on 01/20/22 after receiving treatment for left MCA CVA. Palliative following for ongoing goals of care discussions and symptom management.   SOCIAL HISTORY:     reports that he has been smoking cigarettes. He has a 6.3 pack-year smoking history. He has never used smokeless tobacco.  ADVANCE DIRECTIVES:  Patient reports completed document.  Son Emmytt, [name]. is his healthcare power of attorney.  CODE STATUS: Full code  PAST MEDICAL HISTORY: Past Medical History:  Diagnosis Date   Cancer (HCC)     ALLERGIES:  has No Known  Allergies.  MEDICATIONS:  Current Outpatient Medications  Medication Sig Dispense Refill   acetaminophen (TYLENOL) 500 MG tablet Take 1,000 mg by mouth every 6 (six) hours as needed for moderate pain.     ALPRAZolam (XANAX) 1 MG tablet TAKE 1 TABLET BY MOUTH EVERY 8 HOURS AS NEEDED FOR ANXIETY OR SLEEP 90 tablet 0   aspirin 81 MG EC tablet Take 1 tablet (81 mg total) by mouth daily. Swallow whole. 30 tablet 11   cetirizine (ZYRTEC) 10 MG tablet Take 10 mg by mouth daily.     dexamethasone (DECADRON) 4 MG tablet Take 5 tablets (20 mg total) by mouth once a week. Take 1-2 hour before chemo injection 30 tablet 1   diclofenac Sodium (VOLTAREN) 1 % GEL Apply 1 Application topically 4 (four) times daily as needed (pain).     dronabinol (MARINOL) 2.5 MG capsule TAKE 1 CAPSULE BY MOUTH EVERY DAY IN THE AFTERNOON 30 capsule 0   ezetimibe (ZETIA) 10 MG tablet Take 1 tablet (10 mg total) by mouth daily. 90 tablet 3   folic acid (FOLVITE) 1 MG tablet TAKE 2 TABLETS BY MOUTH EVERY DAY 60 tablet 0   Melatonin 10 MG TABS Take 10 mg by mouth at bedtime.     metoprolol succinate (TOPROL-XL) 25 MG 24 hr tablet TAKE 1/2 TABLET BY MOUTH EVERY DAY 15 tablet 4   midodrine (PROAMATINE) 5 MG tablet TAKE 1 TABLET BY MOUTH 3 TIMES DAILY WITH MEALS 270 tablet 3   mirtazapine (REMERON) 30 MG tablet TAKE 1 TABLET BY MOUTH AT BEDTIME 30 tablet 1   ondansetron (ZOFRAN) 4 MG tablet TAKE 1 TABLET BY MOUTH  EVERY 8 HOURS AS NEEDED FOR NAUSEA AND VOMITING 30 tablet 2   oxyCODONE (ROXICODONE) 15 MG immediate release tablet TAKE 1 TABLET BY MOUTH EVERY 6 HOURS AS NEEDED 84 tablet 0   pantoprazole (PROTONIX) 40 MG tablet TAKE 1 TABLET BY MOUTH 2 TIMES DAILY BEFORE MEALS 60 tablet 0   prochlorperazine (COMPAZINE) 5 MG tablet TAKE 1 TABLET BY MOUTH EVERY 6 HOURS AS NEEDED FOR NAUSEA AND VOMITING 30 tablet 2   STIMULANT LAXATIVE 8.6-50 MG tablet TAKE 1 TABLET BY MOUTH EVERY DAY 30 tablet 6   tamsulosin (FLOMAX) 0.4 MG CAPS capsule  TAKE 1 CAPSULE BY MOUTH EVERY DAY 30 capsule 0   zolpidem (AMBIEN) 5 MG tablet Take 1 tablet (5 mg total) by mouth at bedtime as needed. for sleep 30 tablet 0   zolpidem (AMBIEN) 5 MG tablet TAKE 1 TABLET BY MOUTH NIGHTLY AT BEDTIME AS NEEDED FOR SLEEP 30 tablet 1   No current facility-administered medications for this visit.   Facility-Administered Medications Ordered in Other Visits  Medication Dose Route Frequency Provider Last Rate Last Admin   0.9 %  sodium chloride infusion (Manually program via Guardrails IV Fluids)  250 mL Intravenous Once [name], MD       heparin lock flush 100 unit/mL  500 Units Intracatheter Once [name], MD       sodium chloride flush (NS) 0.9 % injection 10 mL  10 mL Intracatheter Once [name], MD        VITAL SIGNS: There were no vitals taken for this visit. There were no vitals filed for this visit.    Estimated body mass index is 20.79 kg/m as calculated from the following:   Height as of 09/11/23: 5\' 10"  (1.778 m).   Weight as of 09/11/23: 144 lb 14.4 oz (65.7 kg).   PERFORMANCE STATUS (ECOG) : 2 - Symptomatic, <50% confined to bed  Assessment NAD, in wheelchair  RRR Normal breathing pattern Skin dry, flaky, ulcerations AAO x3  IMPRESSION:   Pain Sirus reports his pain is well controlled. Is taking oxycodone as needed.  Tolerating without difficulty.  Does not take around-the-clock.   We will continue to closely monitor.    Decreased Appetite Appetite fluctuates. Some days are better than others. Current weight is 144lbs this is down from 146lbs. 140 lbs on 7/25, 139lbs on 6/27lbs.   PLAN: Oxy IR 5-10 mg every 4-6 hours as needed for breakthrough pain Appetite continues to be a challenge but slowly improving. Marinol 2.5mg  Followed by Dietician.  Xanax 0.5 mg every 8 hours as needed for anxiety/sleep Miralax twice daily Senna-S daily Zofran as needed for nausea.  I will plan to see him back in 3-4 weeks in collaboration with his  other oncology appointments.   Patient expressed understanding and was in agreement with this plan. He also understands that He can call the clinic at any time with any questions, concerns, or complaints.     Any controlled substances utilized were prescribed in the context of palliative care. PDMP has been reviewed.    Visit consisted of counseling and education dealing with the complex and emotionally intense issues of symptom management and palliative care in the setting of serious and potentially life-threatening illness.Greater than 50%  of this time was spent counseling and coordinating care related to the above assessment and plan.  Willette Alma, AGPCNP-BC  Palliative Medicine Team/[facility]  *Please note that this is a verbal dictation therefore any spelling or grammatical  errors are due to the "[employer]agon [employer] One" system interpretation.

## 2023-10-08 ENCOUNTER — Other Ambulatory Visit: Payer: Self-pay | Admitting: Nurse Practitioner

## 2023-10-08 DIAGNOSIS — G893 Neoplasm related pain (acute) (chronic): Secondary | ICD-10-CM

## 2023-10-08 DIAGNOSIS — E854 Organ-limited amyloidosis: Secondary | ICD-10-CM

## 2023-10-08 DIAGNOSIS — Z515 Encounter for palliative care: Secondary | ICD-10-CM

## 2023-10-08 DIAGNOSIS — E8581 Light chain (AL) amyloidosis: Secondary | ICD-10-CM

## 2023-10-08 DIAGNOSIS — G4700 Insomnia, unspecified: Secondary | ICD-10-CM

## 2023-10-08 MED ORDER — OXYCODONE HCL 15 MG PO TABS: 15.0000 mg | ORAL_TABLET | Freq: Four times a day (QID) | ORAL | 0 refills | Status: DC | PRN

## 2023-10-08 MED ORDER — ZOLPIDEM TARTRATE 5 MG PO TABS: 5.0000 mg | ORAL_TABLET | Freq: Every evening | ORAL | 0 refills | Status: DC | PRN

## 2023-10-08 NOTE — Assessment & Plan Note (Addendum)
-  lamda light chain disease with renal, cardiac and neuro involvement   -diagnosed in 08/2021 -not a candidate for transplant  --He began weekly oral Cytoxan, dexa and Velcade injection while inpatient on 09/10/21.  He tolerated well. -He began daratumumab injection 09/27/21. Chemo was held briefly following stroke 01/14/22. -not a candidate for biphosphonate due to renal failure -he completed 6 cycle induction chemo with CyBorD on 03/21/22 and moved to monthly Dara maintenance on 03/28/22. He completed a total of 2 year therapy in 08/2023 -He has had very good partial response to treatment based on his M protein and light chain level. -we discussed options of treatment for relapsed disease  -continue surveillance.

## 2023-10-09 ENCOUNTER — Inpatient Hospital Stay: Payer: Medicare HMO

## 2023-10-09 ENCOUNTER — Inpatient Hospital Stay (HOSPITAL_BASED_OUTPATIENT_CLINIC_OR_DEPARTMENT_OTHER): Payer: Medicare HMO | Admitting: Nurse Practitioner

## 2023-10-09 ENCOUNTER — Inpatient Hospital Stay: Payer: Medicare HMO | Attending: Hematology | Admitting: Hematology

## 2023-10-09 VITALS — BP 149/91 | HR 66 | Temp 98.0°F | Resp 15 | Ht 70.0 in | Wt 148.2 lb

## 2023-10-09 DIAGNOSIS — N186 End stage renal disease: Secondary | ICD-10-CM | POA: Insufficient documentation

## 2023-10-09 DIAGNOSIS — R630 Anorexia: Secondary | ICD-10-CM

## 2023-10-09 DIAGNOSIS — Z515 Encounter for palliative care: Secondary | ICD-10-CM

## 2023-10-09 DIAGNOSIS — Z8673 Personal history of transient ischemic attack (TIA), and cerebral infarction without residual deficits: Secondary | ICD-10-CM | POA: Insufficient documentation

## 2023-10-09 DIAGNOSIS — E854 Organ-limited amyloidosis: Secondary | ICD-10-CM | POA: Diagnosis not present

## 2023-10-09 DIAGNOSIS — R52 Pain, unspecified: Secondary | ICD-10-CM | POA: Insufficient documentation

## 2023-10-09 DIAGNOSIS — D649 Anemia, unspecified: Secondary | ICD-10-CM | POA: Diagnosis not present

## 2023-10-09 DIAGNOSIS — E8581 Light chain (AL) amyloidosis: Secondary | ICD-10-CM

## 2023-10-09 DIAGNOSIS — Z992 Dependence on renal dialysis: Secondary | ICD-10-CM | POA: Diagnosis not present

## 2023-10-09 DIAGNOSIS — F419 Anxiety disorder, unspecified: Secondary | ICD-10-CM

## 2023-10-09 DIAGNOSIS — I509 Heart failure, unspecified: Secondary | ICD-10-CM | POA: Diagnosis not present

## 2023-10-09 DIAGNOSIS — C9000 Multiple myeloma not having achieved remission: Secondary | ICD-10-CM | POA: Diagnosis present

## 2023-10-09 DIAGNOSIS — I43 Cardiomyopathy in diseases classified elsewhere: Secondary | ICD-10-CM | POA: Diagnosis not present

## 2023-10-09 DIAGNOSIS — G893 Neoplasm related pain (acute) (chronic): Secondary | ICD-10-CM | POA: Diagnosis not present

## 2023-10-09 LAB — CBC WITH DIFFERENTIAL/PLATELET
Abs Immature Granulocytes: 0.01 10*3/uL (ref 0.00–0.07)
Basophils Absolute: 0.1 10*3/uL (ref 0.0–0.1)
Basophils Relative: 1 %
Eosinophils Absolute: 0.2 10*3/uL (ref 0.0–0.5)
Eosinophils Relative: 3 %
HCT: 29 % — ABNORMAL LOW (ref 39.0–52.0)
Hemoglobin: 9.4 g/dL — ABNORMAL LOW (ref 13.0–17.0)
Immature Granulocytes: 0 %
Lymphocytes Relative: 29 %
Lymphs Abs: 1.7 10*3/uL (ref 0.7–4.0)
MCH: 31.1 pg (ref 26.0–34.0)
MCHC: 32.4 g/dL (ref 30.0–36.0)
MCV: 96 fL (ref 80.0–100.0)
Monocytes Absolute: 0.4 10*3/uL (ref 0.1–1.0)
Monocytes Relative: 7 %
Neutro Abs: 3.6 10*3/uL (ref 1.7–7.7)
Neutrophils Relative %: 60 %
Platelets: 166 10*3/uL (ref 150–400)
RBC: 3.02 MIL/uL — ABNORMAL LOW (ref 4.22–5.81)
RDW: 15.1 % (ref 11.5–15.5)
WBC: 6 10*3/uL (ref 4.0–10.5)
nRBC: 0 % (ref 0.0–0.2)

## 2023-10-09 LAB — COMPREHENSIVE METABOLIC PANEL
ALT: 8 U/L (ref 0–44)
AST: 13 U/L — ABNORMAL LOW (ref 15–41)
Albumin: 4 g/dL (ref 3.5–5.0)
Alkaline Phosphatase: 81 U/L (ref 38–126)
Anion gap: 12 (ref 5–15)
BUN: 52 mg/dL — ABNORMAL HIGH (ref 8–23)
CO2: 22 mmol/L (ref 22–32)
Calcium: 8.5 mg/dL — ABNORMAL LOW (ref 8.9–10.3)
Chloride: 102 mmol/L (ref 98–111)
Creatinine, Ser: 6.9 mg/dL — ABNORMAL HIGH (ref 0.61–1.24)
GFR, Estimated: 8 mL/min — ABNORMAL LOW (ref >60)
Glucose, Bld: 85 mg/dL (ref 70–99)
Potassium: 4.7 mmol/L (ref 3.5–5.1)
Sodium: 136 mmol/L (ref 135–145)
Total Bilirubin: 0.4 mg/dL (ref 0.3–1.2)
Total Protein: 6.3 g/dL — ABNORMAL LOW (ref 6.5–8.1)

## 2023-10-09 NOTE — Progress Notes (Signed)
Palliative Medicine [facility]  [phone] [phone]   Name: Caleb Simmons Date: 10/09/2023 MRN: [id number]  DOB: [birth year]  Patient Care Team: Caleb Mood, MD as PCP - General (Hematology) Buntin, Caleb Floro, RN (Inactive) as Registered Nurse Pickenpack-Cousar, Caleb Baumgartner, NP as Nurse Practitioner (Nurse Practitioner) Caleb Mood, MD as Attending Physician (Hematology and Oncology)   INTERVAL HISTORY: Caleb Simmons is a 72 y.o. 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  Recently admitted and discharged on 01/20/22 after receiving treatment for left MCA CVA. Palliative following for ongoing goals of care discussions and symptom management.   SOCIAL HISTORY:     reports that he has been smoking cigarettes. He has a 6.3 pack-year smoking history. He has never used smokeless tobacco.  ADVANCE DIRECTIVES:  Patient reports completed document.  [name], Caleb Simmons. is his healthcare power of attorney.  CODE STATUS: Full code  PAST MEDICAL HISTORY: Past Medical History:  Diagnosis Date   Cancer (HCC)     ALLERGIES:  has No Known Allergies.  MEDICATIONS:  Current Outpatient Medications  Medication Sig Dispense Refill   acetaminophen (TYLENOL) 500 MG tablet Take 1,000 mg by mouth every 6 (six) hours as needed for moderate pain.     ALPRAZolam (XANAX) 1 MG tablet TAKE 1 TABLET BY MOUTH EVERY 8 HOURS AS NEEDED FOR ANXIETY OR SLEEP 90 tablet 0   aspirin 81 MG EC tablet Take 1 tablet (81 mg total) by mouth daily. Swallow whole. 30 tablet 11   cetirizine (ZYRTEC) 10 MG tablet Take 10 mg by mouth daily.     dexamethasone (DECADRON) 4 MG tablet Take 5 tablets (20 mg total) by mouth once a week. Take 1-2 hour before chemo injection 30 tablet 1   diclofenac Sodium (VOLTAREN) 1 % GEL Apply 1 Application topically 4 (four) times  daily as needed (pain).     dronabinol (MARINOL) 2.5 MG capsule TAKE 1 CAPSULE BY MOUTH EVERY DAY IN THE AFTERNOON 30 capsule 0   ezetimibe (ZETIA) 10 MG tablet Take 1 tablet (10 mg total) by mouth daily. 90 tablet 3   folic acid (FOLVITE) 1 MG tablet TAKE 2 TABLETS BY MOUTH EVERY DAY 60 tablet 0   Melatonin 10 MG TABS Take 10 mg by mouth at bedtime.     metoprolol succinate (TOPROL-XL) 25 MG 24 hr tablet TAKE 1/2 TABLET BY MOUTH EVERY DAY 15 tablet 4   midodrine (PROAMATINE) 5 MG tablet TAKE 1 TABLET BY MOUTH 3 TIMES DAILY WITH MEALS 270 tablet 3   mirtazapine (REMERON) 30 MG tablet TAKE 1 TABLET BY MOUTH AT BEDTIME 30 tablet 1   ondansetron (ZOFRAN) 4 MG tablet TAKE 1 TABLET BY MOUTH EVERY 8 HOURS AS NEEDED FOR NAUSEA AND VOMITING 30 tablet 2   oxyCODONE (ROXICODONE) 15 MG immediate release tablet Take 1 tablet (15 mg total) by mouth every 6 (six) hours as needed. 84 tablet 0   pantoprazole (PROTONIX) 40 MG tablet TAKE 1 TABLET BY MOUTH 2 TIMES DAILY BEFORE MEALS 60 tablet 0   prochlorperazine (COMPAZINE) 5 MG tablet TAKE 1 TABLET BY MOUTH EVERY 6 HOURS AS NEEDED FOR NAUSEA AND VOMITING 30 tablet 2   STIMULANT LAXATIVE 8.6-50 MG tablet TAKE 1 TABLET BY MOUTH EVERY DAY 30 tablet 6   tamsulosin (FLOMAX) 0.4 MG CAPS capsule TAKE 1 CAPSULE BY MOUTH EVERY DAY 30 capsule  0   zolpidem (AMBIEN) 5 MG tablet TAKE 1 TABLET BY MOUTH NIGHTLY AT BEDTIME AS NEEDED FOR SLEEP 30 tablet 1   zolpidem (AMBIEN) 5 MG tablet Take 1 tablet (5 mg total) by mouth at bedtime as needed. for sleep 30 tablet 0   No current facility-administered medications for this visit.   Facility-Administered Medications Ordered in Other Visits  Medication Dose Route Frequency Provider Last Rate Last Admin   0.9 %  sodium chloride infusion (Manually program via Guardrails IV Fluids)  250 mL Intravenous Once Caleb Mood, MD       heparin lock flush 100 unit/mL  500 Units Intracatheter Once Caleb Mood, MD       sodium chloride flush (NS)  0.9 % injection 10 mL  10 mL Intracatheter Once Caleb Mood, MD        VITAL SIGNS: There were no vitals taken for this visit. There were no vitals filed for this visit.    Estimated body mass index is 21.26 kg/m as calculated from the following:   Height as of an earlier encounter on 10/09/23: 5\' 10"  (1.778 m).   Weight as of an earlier encounter on 10/09/23: 148 lb 3.2 oz (67.2 kg).   PERFORMANCE STATUS (ECOG) : 2 - Symptomatic, <50% confined to bed  Assessment NAD, in wheelchair  RRR Normal breathing pattern AAO x3  IMPRESSION: [name] presents to clinic today for follow-up. Reports he is feeling better compared to previous weeks. Has been outside doing some work around the home. Decrease in fatigue however ongoing. Listening to his body and taking rest breaks as needed.  Denies nausea, vomiting, constipation, or diarrhea.  Is taking things one day at a time.  Pain Veston reports his pain is well controlled. Is taking oxycodone as needed.  Tolerating without difficulty.  Does not take around-the-clock.   We will continue to closely monitor.    Decreased Appetite Appetite fluctuates. Some days are better than others.  Feels appetite is slowly improving.  His weight is up 248 pounds compared to previous visit at 144lb.  140 lbs on 7/25, 139lbs on 6/27lbs.   PLAN: Oxy IR 5-10 mg every 4-6 hours as needed for breakthrough pain Appetite continues to be a challenge but slowly improving. Marinol 2.5mg  Followed by Lacie Draft.  Xanax 0.5 mg every 8 hours as needed for anxiety/sleep Miralax twice daily Senna-S daily Zofran as needed for nausea.  I will plan to see him back in 4-6 weeks in collaboration with his other oncology appointments.   Patient expressed understanding and was in agreement with this plan. He also understands that He can call the clinic at any time with any questions, concerns, or complaints.     Any controlled substances utilized were prescribed in the context  of palliative care. PDMP has been reviewed.    Visit consisted of counseling and education dealing with the complex and emotionally intense issues of symptom management and palliative care in the setting of serious and potentially life-threatening illness.  Willette Alma, AGPCNP-BC  Palliative Medicine Team/[facility]  *Please note that this is a verbal dictation therefore any spelling or grammatical errors are due to the "Dragon Medical One" system interpretation.

## 2023-10-09 NOTE — Progress Notes (Signed)
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  He tolerated well. -He began daratumumab injection 09/27/21. Chemo was held briefly following stroke 01/14/22. -not a candidate for biphosphonate due to renal failure -he completed 6 cycle induction chemo with CyBorD on 03/21/22 and moved to monthly Dara maintenance on 03/28/22. He completed a total of 2 year therapy in 08/2023 -He has had very good partial response to treatment based on his M protein and light chain level. -we discussed options of treatment for relapsed disease  -continue surveillance.      Assessment and Plan    AL Amyloidosis Completed two years of treatment with a very good partial response. Light chain levels significantly reduced from 4000 to near normal. Discussed the possibility of disease recurrence in the future and the potential need for re-initiation of treatment. -Monitor light chain levels every two months. -Follow-up in four months. -He will continue follow-up with our palliative care nurse petitioner [facility] for symptom management  ESRD -Hemodialysis twice a week  Cardiac amyloidosis -Follow-up with cardiology    Plan -He has completed 2 years of therapy, will start disease monitoring -f/u in 2 months with lab a week before    Discussed the use  of AI scribe software for clinical note transcription with the patient, who gave verbal consent to proceed.  History of Present Illness   A 72 year old patient with a history of AL amyloidosis presents for a follow-up visit after completing a two-year treatment regimen. The patient has been taking dexamethasone as part of the treatment, which has now been discontinued. The patient's light chain level, which was extremely high at the time of diagnosis, has now decreased to near-normal levels, indicating a very good partial response to the treatment. The patient also has a history of dialysis for kidney issues. The patient's appetite has been low and he has experienced feelings of sickness in his stomach. The patient's weight has been stable, with a slight increase noted during this visit. The patient does not report any pain or other concerning symptoms.         All other systems were reviewed with the patient and are negative.  MEDICAL HISTORY:  Past Medical History:  Diagnosis Date   Cancer [facility])     SURGICAL HISTORY: Past Surgical History:  Procedure Laterality Date   APPENDECTOMY     IR ANGIO INTRA EXTRACRAN SEL COM CAROTID INNOMINATE BILAT MOD SED  01/16/2022   IR ANGIO INTRA EXTRACRAN SEL COM CAROTID INNOMINATE BILAT MOD SED  07/16/2022   IR ANGIO VERTEBRAL SEL SUBCLAVIAN INNOMINATE BILAT MOD SED  01/16/2022   IR ANGIO VERTEBRAL SEL SUBCLAVIAN INNOMINATE UNI R MOD SED  07/16/2022   IR ANGIO VERTEBRAL SEL VERTEBRAL UNI L MOD SED  07/16/2022   IR CT HEAD LTD  01/18/2022   IR EMBO ART  VEN HEMORR LYMPH EXTRAV  INC GUIDE ROADMAPPING  08/30/2021   IR FLUORO GUIDE CV LINE RIGHT  08/30/2021   IR FLUORO GUIDE CV LINE RIGHT  09/18/2021   IR FLUORO GUIDE CV LINE RIGHT  05/02/2022   IR PERCUTANEOUS ART THROMBECTOMY/INFUSION INTRACRANIAL INC DIAG ANGIO  01/18/2022   IR RADIOLOGIST EVAL & MGMT  08/02/2022   IR RENAL SELECTIVE  UNI INC S&I MOD SED  08/31/2021   IR US GUIDE VASC ACCESS RIGHT  08/30/2021   IR  US GUIDE VASC ACCESS RIGHT  08/30/2021   IR US GUIDE VASC ACCESS RIGHT  09/18/2021   IR US GUIDE VASC ACCESS RIGHT  01/16/2022   IR US GUIDE VASC ACCESS RIGHT  07/16/2022   IR VENOCAVAGRAM SVC  05/02/2022   RADIOLOGY WITH ANESTHESIA N/A 01/18/2022   Procedure: Cerebral angioplasty with possible stenting;  Surgeon: Julieanne Cotton, MD;  Location: [facility] OR;  Service: Radiology;  Laterality: N/A;    I have reviewed the social history and family history with the patient and they are unchanged from previous note.  ALLERGIES:  has No Known Allergies.  MEDICATIONS:  Current Outpatient Medications  Medication Sig Dispense Refill   acetaminophen (TYLENOL) 500 MG tablet Take 1,000 mg by mouth every 6 (six) hours as needed for moderate pain.     ALPRAZolam (XANAX) 1 MG tablet TAKE 1 TABLET BY MOUTH EVERY 8 HOURS AS NEEDED FOR ANXIETY OR SLEEP 90 tablet 0   aspirin 81 MG EC tablet Take 1 tablet (81 mg total) by mouth daily. Swallow whole. 30 tablet 11   cetirizine (ZYRTEC) 10 MG tablet Take 10 mg by mouth daily.     dexamethasone (DECADRON) 4 MG tablet Take 5 tablets (20 mg total) by mouth once a week. Take 1-2 hour before chemo injection 30 tablet 1   diclofenac Sodium (VOLTAREN) 1 % GEL Apply 1 Application topically 4 (four) times daily as needed (pain).     dronabinol (MARINOL) 2.5 MG capsule TAKE 1 CAPSULE BY MOUTH EVERY DAY IN THE AFTERNOON 30 capsule 0   ezetimibe (ZETIA) 10 MG tablet Take 1 tablet (10 mg total) by mouth daily. 90 tablet 3   folic acid (FOLVITE) 1 MG tablet TAKE 2 TABLETS BY MOUTH EVERY DAY 60 tablet 0   Melatonin 10 MG TABS Take 10 mg by mouth at bedtime.     metoprolol succinate (TOPROL-XL) 25 MG 24 hr tablet TAKE 1/2 TABLET BY MOUTH EVERY DAY 15 tablet 4   midodrine (PROAMATINE) 5 MG tablet TAKE 1 TABLET BY MOUTH 3 TIMES DAILY WITH MEALS 270 tablet 3   mirtazapine (REMERON) 30 MG tablet TAKE 1 TABLET BY MOUTH AT BEDTIME 30 tablet 1   ondansetron (ZOFRAN) 4 MG tablet TAKE 1 TABLET  BY MOUTH EVERY 8 HOURS AS NEEDED FOR NAUSEA AND VOMITING 30 tablet 2   oxyCODONE (ROXICODONE) 15 MG immediate release tablet Take 1 tablet (15 mg total) by mouth every 6 (six) hours as needed. 84 tablet 0   pantoprazole (PROTONIX) 40 MG tablet TAKE 1 TABLET BY MOUTH 2 TIMES DAILY BEFORE MEALS 60 tablet 0   prochlorperazine (COMPAZINE) 5 MG tablet TAKE 1 TABLET BY MOUTH EVERY 6 HOURS AS NEEDED FOR NAUSEA AND VOMITING 30 tablet 2   STIMULANT LAXATIVE 8.6-50 MG tablet TAKE 1 TABLET BY MOUTH EVERY DAY 30 tablet 6   tamsulosin (FLOMAX) 0.4 MG CAPS capsule TAKE 1 CAPSULE BY MOUTH EVERY DAY 30 capsule 0   zolpidem (AMBIEN) 5 MG tablet TAKE 1 TABLET BY MOUTH NIGHTLY AT BEDTIME AS NEEDED  FOR SLEEP 30 tablet 1   zolpidem (AMBIEN) 5 MG tablet Take 1 tablet (5 mg total) by mouth at bedtime as needed. for sleep 30 tablet 0   No current facility-administered medications for this visit.   Facility-Administered Medications Ordered in Other Visits  Medication Dose Route Frequency Provider Last Rate Last Admin   0.9 %  sodium chloride infusion (Manually program via Guardrails IV Fluids)  250 mL Intravenous Once [name], MD       heparin lock flush 100 unit/mL  500 Units Intracatheter Once [name], MD       sodium chloride flush (NS) 0.9 % injection 10 mL  10 mL Intracatheter Once [name], MD        PHYSICAL EXAMINATION: ECOG PERFORMANCE STATUS: 2 - Symptomatic, <50% confined to bed  Vitals:   10/09/23 0847  BP: (!) 149/91  Pulse: 66  Resp: 15  Temp: 98 F (36.7 C)  SpO2: 100%   Wt Readings from Last 3 Encounters:  10/09/23 148 lb 3.2 oz (67.2 kg)  09/11/23 144 lb 14.4 oz (65.7 kg)  08/14/23 146 lb (66.2 kg)     GENERAL:alert, no distress and comfortable SKIN: skin color, texture, turgor are normal, no rashes or significant lesions EYES: normal, Conjunctiva are pink and non-injected, sclera clear Musculoskeletal:no cyanosis of digits and no clubbing  NEURO: alert & oriented x 3 with fluent  speech, no focal motor/sensory deficits   LABORATORY DATA:  I have reviewed the data as listed    Latest Ref Rng & Units 10/09/2023    8:25 AM 09/11/2023    8:22 AM 08/14/2023    8:00 AM  CBC  WBC 4.0 - 10.5 K/uL 6.0  8.1  9.2   Hemoglobin 13.0 - 17.0 g/dL 9.4  9.8  9.8   Hematocrit 39.0 - 52.0 % 29.0  29.8  30.5   Platelets 150 - 400 K/uL 166  170  156         Latest Ref Rng & Units 10/09/2023    8:25 AM 09/11/2023    8:22 AM 08/14/2023    8:00 AM  CMP  Glucose 70 - 99 mg/dL 85  409  90   BUN 8 - 23 mg/dL 52  62  65   Creatinine 0.61 - 1.24 mg/dL 8.11  9.14  7.82   Sodium 135 - 145 mmol/L 136  136  134   Potassium 3.5 - 5.1 mmol/L 4.7  4.6  5.4   Chloride 98 - 111 mmol/L 102  102  101   CO2 22 - 32 mmol/L 22  23  21    Calcium 8.9 - 10.3 mg/dL 8.5  8.6  8.7   Total Protein 6.5 - 8.1 g/dL 6.3  6.2  6.1   Total Bilirubin 0.3 - 1.2 mg/dL 0.4  0.4  0.4   Alkaline Phos 38 - 126 U/L 81  84  77   AST 15 - 41 U/L 13  10  13    ALT 0 - 44 U/L 8  8  12        RADIOGRAPHIC STUDIES: I have personally reviewed the radiological images as listed and agreed with the findings in the report. No results found.    Orders Placed This Encounter  Procedures   Kappa/lambda light chains    Standing Status:   Standing    Number of Occurrences:   30    Standing Expiration Date:   10/07/2024   Multiple Myeloma Panel (SPEP&IFE w/QIG)  Standing Status:   Standing    Number of Occurrences:   30    Standing Expiration Date:   10/07/2024   All questions were answered. The patient knows to call the clinic with any problems, questions or concerns. No barriers to learning was detected. The total time spent in the appointment was 25 minutes.     [name], MD 10/09/2023

## 2023-10-10 ENCOUNTER — Inpatient Hospital Stay: Payer: Medicare HMO

## 2023-10-10 ENCOUNTER — Encounter: Payer: Self-pay | Admitting: Hematology

## 2023-10-10 LAB — KAPPA/LAMBDA LIGHT CHAINS
Kappa free light chain: 49.8 mg/L — ABNORMAL HIGH (ref 3.3–19.4)
Kappa, lambda light chain ratio: 2.18 — ABNORMAL HIGH (ref 0.26–1.65)
Lambda free light chains: 22.8 mg/L (ref 5.7–26.3)

## 2023-10-14 ENCOUNTER — Other Ambulatory Visit: Payer: Self-pay | Admitting: Nurse Practitioner

## 2023-10-14 ENCOUNTER — Other Ambulatory Visit: Payer: Self-pay | Admitting: Hematology

## 2023-10-14 DIAGNOSIS — E8581 Light chain (AL) amyloidosis: Secondary | ICD-10-CM

## 2023-10-14 DIAGNOSIS — E854 Organ-limited amyloidosis: Secondary | ICD-10-CM

## 2023-10-14 DIAGNOSIS — F419 Anxiety disorder, unspecified: Secondary | ICD-10-CM

## 2023-10-14 DIAGNOSIS — Z515 Encounter for palliative care: Secondary | ICD-10-CM

## 2023-10-14 LAB — MULTIPLE MYELOMA PANEL, SERUM
Albumin SerPl Elph-Mcnc: 3.4 g/dL (ref 2.9–4.4)
Albumin/Glob SerPl: 1.7 (ref 0.7–1.7)
Alpha 1: 0.3 g/dL (ref 0.0–0.4)
Alpha2 Glob SerPl Elph-Mcnc: 0.7 g/dL (ref 0.4–1.0)
B-Globulin SerPl Elph-Mcnc: 0.7 g/dL (ref 0.7–1.3)
Gamma Glob SerPl Elph-Mcnc: 0.3 g/dL — ABNORMAL LOW (ref 0.4–1.8)
Globulin, Total: 2.1 g/dL — ABNORMAL LOW (ref 2.2–3.9)
IgA: 49 mg/dL — ABNORMAL LOW (ref 61–437)
IgG (Immunoglobin G), Serum: 384 mg/dL — ABNORMAL LOW (ref 603–1613)
IgM (Immunoglobulin M), Srm: 57 mg/dL (ref 15–143)
Total Protein ELP: 5.5 g/dL — ABNORMAL LOW (ref 6.0–8.5)

## 2023-10-15 ENCOUNTER — Encounter: Payer: Self-pay | Admitting: Hematology

## 2023-10-24 ENCOUNTER — Other Ambulatory Visit: Payer: Self-pay

## 2023-10-24 ENCOUNTER — Inpatient Hospital Stay (HOSPITAL_COMMUNITY)
Admission: EM | Admit: 2023-10-24 | Discharge: 2023-10-26 | DRG: 871 | Disposition: A | Discharge status: Home Health | Payer: Medicare HMO | Attending: Family Medicine | Admitting: Family Medicine

## 2023-10-24 ENCOUNTER — Encounter (HOSPITAL_COMMUNITY): Payer: Self-pay | Admitting: *Deleted

## 2023-10-24 ENCOUNTER — Emergency Department (HOSPITAL_COMMUNITY): Payer: Medicare HMO

## 2023-10-24 DIAGNOSIS — A419 Sepsis, unspecified organism: Secondary | ICD-10-CM | POA: Diagnosis present

## 2023-10-24 DIAGNOSIS — R6510 Systemic inflammatory response syndrome (SIRS) of non-infectious origin without acute organ dysfunction: Secondary | ICD-10-CM | POA: Diagnosis not present

## 2023-10-24 DIAGNOSIS — F419 Anxiety disorder, unspecified: Secondary | ICD-10-CM | POA: Diagnosis present

## 2023-10-24 DIAGNOSIS — K219 Gastro-esophageal reflux disease without esophagitis: Secondary | ICD-10-CM | POA: Diagnosis present

## 2023-10-24 DIAGNOSIS — J410 Simple chronic bronchitis: Secondary | ICD-10-CM | POA: Diagnosis present

## 2023-10-24 DIAGNOSIS — Z8619 Personal history of other infectious and parasitic diseases: Secondary | ICD-10-CM | POA: Diagnosis not present

## 2023-10-24 DIAGNOSIS — Y92009 Unspecified place in unspecified non-institutional (private) residence as the place of occurrence of the external cause: Secondary | ICD-10-CM | POA: Diagnosis not present

## 2023-10-24 DIAGNOSIS — Z79899 Other long term (current) drug therapy: Secondary | ICD-10-CM

## 2023-10-24 DIAGNOSIS — N400 Enlarged prostate without lower urinary tract symptoms: Secondary | ICD-10-CM | POA: Diagnosis present

## 2023-10-24 DIAGNOSIS — D631 Anemia in chronic kidney disease: Secondary | ICD-10-CM | POA: Diagnosis present

## 2023-10-24 DIAGNOSIS — I679 Cerebrovascular disease, unspecified: Secondary | ICD-10-CM | POA: Diagnosis present

## 2023-10-24 DIAGNOSIS — E785 Hyperlipidemia, unspecified: Secondary | ICD-10-CM | POA: Diagnosis present

## 2023-10-24 DIAGNOSIS — D72829 Elevated white blood cell count, unspecified: Secondary | ICD-10-CM | POA: Diagnosis present

## 2023-10-24 DIAGNOSIS — I5032 Chronic diastolic (congestive) heart failure: Secondary | ICD-10-CM | POA: Diagnosis present

## 2023-10-24 DIAGNOSIS — E871 Hypo-osmolality and hyponatremia: Secondary | ICD-10-CM | POA: Diagnosis present

## 2023-10-24 DIAGNOSIS — Z888 Allergy status to other drugs, medicaments and biological substances status: Secondary | ICD-10-CM

## 2023-10-24 DIAGNOSIS — J189 Pneumonia, unspecified organism: Secondary | ICD-10-CM | POA: Diagnosis present

## 2023-10-24 DIAGNOSIS — D849 Immunodeficiency, unspecified: Secondary | ICD-10-CM | POA: Diagnosis present

## 2023-10-24 DIAGNOSIS — I739 Peripheral vascular disease, unspecified: Secondary | ICD-10-CM | POA: Diagnosis present

## 2023-10-24 DIAGNOSIS — E860 Dehydration: Secondary | ICD-10-CM | POA: Diagnosis present

## 2023-10-24 DIAGNOSIS — Z1152 Encounter for screening for COVID-19: Secondary | ICD-10-CM

## 2023-10-24 DIAGNOSIS — N186 End stage renal disease: Secondary | ICD-10-CM | POA: Diagnosis present

## 2023-10-24 DIAGNOSIS — F17210 Nicotine dependence, cigarettes, uncomplicated: Secondary | ICD-10-CM | POA: Diagnosis present

## 2023-10-24 DIAGNOSIS — Z7982 Long term (current) use of aspirin: Secondary | ICD-10-CM

## 2023-10-24 DIAGNOSIS — E8581 Light chain (AL) amyloidosis: Secondary | ICD-10-CM | POA: Diagnosis present

## 2023-10-24 DIAGNOSIS — K922 Gastrointestinal hemorrhage, unspecified: Secondary | ICD-10-CM

## 2023-10-24 DIAGNOSIS — Z992 Dependence on renal dialysis: Secondary | ICD-10-CM | POA: Diagnosis not present

## 2023-10-24 DIAGNOSIS — D649 Anemia, unspecified: Secondary | ICD-10-CM

## 2023-10-24 DIAGNOSIS — Z8673 Personal history of transient ischemic attack (TIA), and cerebral infarction without residual deficits: Secondary | ICD-10-CM

## 2023-10-24 DIAGNOSIS — R195 Other fecal abnormalities: Secondary | ICD-10-CM | POA: Diagnosis present

## 2023-10-24 DIAGNOSIS — R509 Fever, unspecified: Secondary | ICD-10-CM | POA: Diagnosis present

## 2023-10-24 DIAGNOSIS — W1830XA Fall on same level, unspecified, initial encounter: Secondary | ICD-10-CM | POA: Diagnosis present

## 2023-10-24 LAB — COMPREHENSIVE METABOLIC PANEL
ALT: 16 U/L (ref 0–44)
AST: 41 U/L (ref 15–41)
Albumin: 2.8 g/dL — ABNORMAL LOW (ref 3.5–5.0)
Alkaline Phosphatase: 72 U/L (ref 38–126)
Anion gap: 14 (ref 5–15)
BUN: 21 mg/dL (ref 8–23)
CO2: 22 mmol/L (ref 22–32)
Calcium: 7.9 mg/dL — ABNORMAL LOW (ref 8.9–10.3)
Chloride: 97 mmol/L — ABNORMAL LOW (ref 98–111)
Creatinine, Ser: 3.51 mg/dL — ABNORMAL HIGH (ref 0.61–1.24)
GFR, Estimated: 18 mL/min — ABNORMAL LOW (ref >60)
Glucose, Bld: 126 mg/dL — ABNORMAL HIGH (ref 70–99)
Potassium: 3.6 mmol/L (ref 3.5–5.1)
Sodium: 133 mmol/L — ABNORMAL LOW (ref 135–145)
Total Bilirubin: 0.9 mg/dL (ref 0.3–1.2)
Total Protein: 5.3 g/dL — ABNORMAL LOW (ref 6.5–8.1)

## 2023-10-24 LAB — POC OCCULT BLOOD, ED: Fecal Occult Bld: POSITIVE — AB

## 2023-10-24 LAB — CBC WITH DIFFERENTIAL/PLATELET
Abs Immature Granulocytes: 0.35 10*3/uL — ABNORMAL HIGH (ref 0.00–0.07)
Basophils Absolute: 0 10*3/uL (ref 0.0–0.1)
Basophils Relative: 0 %
Eosinophils Absolute: 0 10*3/uL (ref 0.0–0.5)
Eosinophils Relative: 0 %
HCT: 24.7 % — ABNORMAL LOW (ref 39.0–52.0)
Hemoglobin: 7.9 g/dL — ABNORMAL LOW (ref 13.0–17.0)
Immature Granulocytes: 2 %
Lymphocytes Relative: 2 %
Lymphs Abs: 0.4 10*3/uL — ABNORMAL LOW (ref 0.7–4.0)
MCH: 30.3 pg (ref 26.0–34.0)
MCHC: 32 g/dL (ref 30.0–36.0)
MCV: 94.6 fL (ref 80.0–100.0)
Monocytes Absolute: 0.5 10*3/uL (ref 0.1–1.0)
Monocytes Relative: 2 %
Neutro Abs: 19 10*3/uL — ABNORMAL HIGH (ref 1.7–7.7)
Neutrophils Relative %: 94 %
Platelets: 145 10*3/uL — ABNORMAL LOW (ref 150–400)
RBC: 2.61 MIL/uL — ABNORMAL LOW (ref 4.22–5.81)
RDW: 14.4 % (ref 11.5–15.5)
WBC: 20.2 10*3/uL — ABNORMAL HIGH (ref 4.0–10.5)
nRBC: 0 % (ref 0.0–0.2)

## 2023-10-24 LAB — IRON AND TIBC
Iron: <5 ug/dL — ABNORMAL LOW (ref 45–182)
TIBC: 169 ug/dL — ABNORMAL LOW (ref 250–450)

## 2023-10-24 LAB — FERRITIN: Ferritin: 1168 ng/mL — ABNORMAL HIGH (ref 24–336)

## 2023-10-24 LAB — PROTIME-INR
INR: 1.1 (ref 0.8–1.2)
Prothrombin Time: 14.6 s (ref 11.4–15.2)

## 2023-10-24 LAB — RESP PANEL BY RT-PCR (RSV, FLU A&B, COVID)  RVPGX2
Influenza A by PCR: NEGATIVE
Influenza B by PCR: NEGATIVE
Resp Syncytial Virus by PCR: NEGATIVE
SARS Coronavirus 2 by RT PCR: NEGATIVE

## 2023-10-24 LAB — TSH: TSH: 0.787 u[IU]/mL (ref 0.350–4.500)

## 2023-10-24 LAB — MAGNESIUM: Magnesium: 1.6 mg/dL — ABNORMAL LOW (ref 1.7–2.4)

## 2023-10-24 LAB — I-STAT CG4 LACTIC ACID, ED
Lactic Acid, Venous: 1.2 mmol/L (ref 0.5–1.9)
Lactic Acid, Venous: 1.3 mmol/L (ref 0.5–1.9)

## 2023-10-24 MED ORDER — ACETAMINOPHEN 650 MG RE SUPP: 650.0000 mg | Freq: Four times a day (QID) | RECTAL | Status: DC | PRN

## 2023-10-24 MED ORDER — ACETAMINOPHEN 325 MG PO TABS
650.0000 mg | ORAL_TABLET | Freq: Four times a day (QID) | ORAL | Status: DC | PRN
  Administered 2023-10-24 – 2023-10-26 (×3): 650 mg via ORAL
  Filled 2023-10-24 (×5): qty 2

## 2023-10-24 MED ORDER — MIDODRINE HCL 5 MG PO TABS
5.0000 mg | ORAL_TABLET | Freq: Three times a day (TID) | ORAL | Status: DC
  Administered 2023-10-25 – 2023-10-26 (×4): 5 mg via ORAL
  Filled 2023-10-24 (×4): qty 1

## 2023-10-24 MED ORDER — ALPRAZOLAM 0.5 MG PO TABS
1.0000 mg | ORAL_TABLET | Freq: Three times a day (TID) | ORAL | Status: DC | PRN
  Administered 2023-10-24 – 2023-10-25 (×2): 1 mg via ORAL
  Filled 2023-10-24 (×4): qty 2

## 2023-10-24 MED ORDER — SODIUM CHLORIDE 0.9 % IV SOLN
2.0000 g | Freq: Once | INTRAVENOUS | Status: AC
  Administered 2023-10-24: 2 g via INTRAVENOUS
  Filled 2023-10-24: qty 12.5

## 2023-10-24 MED ORDER — METOPROLOL SUCCINATE ER 25 MG PO TB24
12.5000 mg | ORAL_TABLET | Freq: Every day | ORAL | Status: DC
  Administered 2023-10-24 – 2023-10-26 (×3): 12.5 mg via ORAL
  Filled 2023-10-24 (×3): qty 1

## 2023-10-24 MED ORDER — MIRTAZAPINE 15 MG PO TABS
30.0000 mg | ORAL_TABLET | Freq: Every day | ORAL | Status: DC
  Administered 2023-10-24 – 2023-10-25 (×2): 30 mg via ORAL
  Filled 2023-10-24 (×4): qty 2

## 2023-10-24 MED ORDER — HEPARIN SODIUM (PORCINE) 5000 UNIT/ML IJ SOLN
5000.0000 [IU] | Freq: Three times a day (TID) | INTRAMUSCULAR | Status: DC
  Administered 2023-10-24 – 2023-10-26 (×5): 5000 [IU] via SUBCUTANEOUS
  Filled 2023-10-24 (×7): qty 1

## 2023-10-24 MED ORDER — ONDANSETRON HCL 4 MG/2ML IJ SOLN: 4.0000 mg | Freq: Four times a day (QID) | INTRAMUSCULAR | Status: DC | PRN

## 2023-10-24 MED ORDER — TAMSULOSIN HCL 0.4 MG PO CAPS
0.4000 mg | ORAL_CAPSULE | Freq: Every day | ORAL | Status: DC
  Administered 2023-10-24 – 2023-10-26 (×3): 0.4 mg via ORAL
  Filled 2023-10-24 (×3): qty 1

## 2023-10-24 MED ORDER — PANTOPRAZOLE SODIUM 40 MG PO TBEC
40.0000 mg | DELAYED_RELEASE_TABLET | Freq: Two times a day (BID) | ORAL | Status: DC
  Administered 2023-10-24 – 2023-10-26 (×4): 40 mg via ORAL
  Filled 2023-10-24 (×4): qty 1

## 2023-10-24 MED ORDER — SENNOSIDES-DOCUSATE SODIUM 8.6-50 MG PO TABS: 1.0000 | ORAL_TABLET | Freq: Every evening | ORAL | Status: DC | PRN

## 2023-10-24 MED ORDER — ONDANSETRON HCL 4 MG PO TABS: 4.0000 mg | ORAL_TABLET | Freq: Four times a day (QID) | ORAL | Status: DC | PRN

## 2023-10-24 MED ORDER — VANCOMYCIN HCL 1250 MG/250ML IV SOLN
1250.0000 mg | Freq: Once | INTRAVENOUS | Status: AC
  Administered 2023-10-24: 1250 mg via INTRAVENOUS
  Filled 2023-10-24: qty 250

## 2023-10-24 NOTE — ED Triage Notes (Addendum)
Fever of 101.6 taken at home. Dialysis MF only and finished session today. Tachy at 114 bpm. Alert and oriented x 4. Last chemo session was last week for bone cancer.

## 2023-10-24 NOTE — Plan of Care (Signed)

## 2023-10-24 NOTE — H&P (Signed)
History and Physical    Patient: Caleb Simmons:[id number] DOB: 28-Apr-1951 DOA: 10/24/2023 DOS: the patient was seen and examined on 10/24/2023 PCP: [name], MD  Patient coming from: Home  Chief Complaint:  Chief Complaint  Patient presents with   Fever   HPI: Caleb Simmons is a 72 y.o. 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 Patient reports that over the last 2 days, he has had fevers and chills up to a temperature of 101.6. Also noted to be tachycardic at home.  He was given Tylenol by his nursing aide.  He completed dialysis today but was noted to have fever at the dialysis center.  He reports to watery stools yesterday.  Patient also reports feeling fatigue and generalized weakness but denies any dizziness, headaches, coughing, chest pain, shortness of breath, abdominal pain, urinary discomfort, melena, hematuria or vision changes.  He denies rashes but has had a scab on his right lateral thigh that is now open after scratching it.   ED course: Hemodynamically stable with HR in the 80s to 90s and temp of 98.9.  Labs significant for WBC 20.2, Hgb 7.9, platelet 145.  BMP without electrolyte abnormalities.  Normal PT/INR, normal lactate, negative flu and COVID test. Positive fecal acute test.  GI consulted for evaluation and hospitalist consulted for admission.  Review of Systems: As mentioned in the history of present illness. All other systems reviewed and are negative. Past Medical History:  Diagnosis Date   [facility])    Past Surgical History:  Procedure Laterality Date   APPENDECTOMY     IR ANGIO INTRA EXTRACRAN SEL COM CAROTID INNOMINATE BILAT MOD SED  01/16/2022   IR ANGIO INTRA EXTRACRAN SEL COM CAROTID INNOMINATE BILAT MOD SED  07/16/2022   IR ANGIO VERTEBRAL SEL SUBCLAVIAN INNOMINATE BILAT MOD SED  01/16/2022   IR ANGIO VERTEBRAL SEL SUBCLAVIAN INNOMINATE UNI R MOD SED  07/16/2022   IR  ANGIO VERTEBRAL SEL VERTEBRAL UNI L MOD SED  07/16/2022   IR CT HEAD LTD  01/18/2022   IR EMBO ART  VEN HEMORR LYMPH EXTRAV  INC GUIDE ROADMAPPING  08/30/2021   IR FLUORO GUIDE CV LINE RIGHT  08/30/2021   IR FLUORO GUIDE CV LINE RIGHT  09/18/2021   IR FLUORO GUIDE CV LINE RIGHT  05/02/2022   IR PERCUTANEOUS ART THROMBECTOMY/INFUSION INTRACRANIAL INC DIAG ANGIO  01/18/2022   IR RADIOLOGIST EVAL & MGMT  08/02/2022   IR RENAL SELECTIVE  UNI INC S&I MOD SED  08/31/2021   IR US GUIDE VASC ACCESS RIGHT  08/30/2021   IR US GUIDE VASC ACCESS RIGHT  08/30/2021   IR US GUIDE VASC ACCESS RIGHT  09/18/2021   IR US GUIDE VASC ACCESS RIGHT  01/16/2022   IR US GUIDE VASC ACCESS RIGHT  07/16/2022   IR VENOCAVAGRAM SVC  05/02/2022   RADIOLOGY WITH ANESTHESIA N/A 01/18/2022   Procedure: Cerebral angioplasty with possible stenting;  Surgeon: Julieanne Cotton, MD;  Location: [facility] OR;  Service: Radiology;  Laterality: N/A;   Social History:  reports that he has been smoking cigarettes. He has a 6.3 pack-year smoking history. He has never used smokeless tobacco. No history on file for alcohol use and drug use.  Allergies  Allergen Reactions   Iron Sucrose Nausea And Vomiting    History reviewed. No pertinent family history.  Prior to Admission medications   Medication Sig Start Date  End Date Taking? Authorizing Provider  acetaminophen (TYLENOL) 500 MG tablet Take 1,000 mg by mouth every 6 (six) hours as needed for moderate pain.   Yes [provider]  ALPRAZolam (XANAX) 1 MG tablet TAKE 1 TABLET BY MOUTH EVERY 8 HOURS AS NEEDED FOR ANXIETY OR SLEEP Patient taking differently: Take 1 mg by mouth every 8 (eight) hours as needed for sleep. 10/14/23  Yes Pickenpack-Cousar, Arty Baumgartner, NP  aspirin 81 MG EC tablet Take 1 tablet (81 mg total) by mouth daily. Swallow whole. 01/20/22  Yes Almon Hercules, MD  fluticasone (FLONASE) 50 MCG/ACT nasal spray Place 1 spray into both nostrils daily.   Yes [provider]   metoprolol succinate (TOPROL-XL) 25 MG 24 hr tablet TAKE 1/2 TABLET BY MOUTH EVERY DAY 08/21/23  Yes Laurey Morale, MD  midodrine (PROAMATINE) 5 MG tablet TAKE 1 TABLET BY MOUTH 3 TIMES DAILY WITH MEALS Patient taking differently: Take 5 mg by mouth 3 (three) times daily with meals. 08/11/23  Yes Laurey Morale, MD  mirtazapine (REMERON) 30 MG tablet TAKE 1 TABLET BY MOUTH AT BEDTIME 10/15/23  Yes Pickenpack-Cousar, Athena N, NP  pantoprazole (PROTONIX) 40 MG tablet TAKE 1 TABLET BY MOUTH 2 TIMES DAILY BEFORE MEALS Patient taking differently: Take 40 mg by mouth 2 (two) times daily before a meal. 10/15/23  Yes [name], MD  tamsulosin (FLOMAX) 0.4 MG CAPS capsule TAKE 1 CAPSULE BY MOUTH ONCE DAILY 10/15/23  Yes [name], MD  zolpidem (AMBIEN) 5 MG tablet Take 1 tablet (5 mg total) by mouth at bedtime as needed. for sleep 10/08/23  Yes Pickenpack-Cousar, Arty Baumgartner, NP    Physical Exam: Vitals:   10/24/23 1417  BP: 124/62  Pulse: 82  Resp: 15  Temp: 98.9 F (37.2 C)  TempSrc: Oral  SpO2: 100%  General: Pleasant, chronically ill-appearing elderly man laying in bed. No acute distress. HEENT: Age-related bumps on the face.  Anicteric sclera. CV: Mild tachycardia. Regular rhythm. No murmurs, rubs, or gallops. No LE edema Pulmonary: Lungs CTAB. Normal effort. No wheezing or rales. Decreased breath sounds at the bases. Abdominal: Soft, nontender, nondistended. Normal bowel sounds. Extremities: Palpable radial and DP pulses. Normal ROM. Skin: Warm and dry. Small erythematous nodule on the right lateral thigh. Multiple nodules on the left arm and lower legs. Neuro: A&Ox3. Moves all extremities. Normal sensation. No focal deficit. Psych: Normal mood and affect  Data Reviewed:  Pending urinalysis and blood culture Drop in hemoglobin from 9.4 two weeks ago to 7.9. EKG shows normal sinus rhythm, prolonged QTc and occasional PACs. CXR without acute cardiopulmonary disease.  Assessment and  Plan:  Caleb Simmons is a 72 y.o. 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   # SIRS Elderly immunocompromised patient with history of group B strep bacteremia 2 years ago. presented today with acute onset of fever with tachycardia and found to have leukocytosis. Patient meets SIRS criteria but no source of infection has been found at the moment. He has no symptoms except fatigue. No headaches or meningeal symptoms to indicate meningitis. Due to his immunocompromise state, will continue broad-spectrum antibiotics and follow-up blood culture. -Continue vancomycin and cefepime -Follow-up blood culture, UA, lactic acid -Trend CBC, fever curve  # Symptomatic anemia Patient with a history of anemia with baseline hemoglobin around 9.5 found to have a drop in his hemoglobin  to 7.9 on admission with positive fecal occult test. Patient denies any melena or hematochezia. There are no sign of active GI bleed at the moment. GI initially consulted in the ED but recommends managing sepsis and re-consulting them tomorrow if there is active GI bleed or worsening hemoglobin. -Check iron studies -Trend CBC, transfuse to keep hgb >7  # ESRD on HD Patient will be doing HD on Monday and Fridays.  Completed HD today without any difficulties.  Labs today shows improved kidney function without electrolyte normalities. -Resume HD on Monday -Midodrine 5 mg 3 times daily with meals -Avoid nephrotoxic agents  # 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  He completed 2 years of chemotherapy 2 weeks ago and currently on surveillance. -Follow-up oncology in the outpatient  # Chronic heart failure Last TTE a year ago showed EF 55-60%, mild LVH and G1DD.  No evidence of heart failure exacerbation on exam. -Continue metoprolol succinate 12.5 mg daily  # GERD -Resume Protonix 40  mg twice daily  # BPH -Resume Flomax 0.4 mg daily  # Anxiety -Resume mirtazapine 30 mg at bedtime -Resume home as needed Xanax 1 mg   Advance Care Planning:   Code Status: Full Code   Consults: GI  Family Communication: No family at bedside  Severity of Illness: The appropriate patient status for this patient is INPATIENT. Inpatient status is judged to be reasonable and necessary in order to provide the required intensity of service to ensure the patient's safety. The patient's presenting symptoms, physical exam findings, and initial radiographic and laboratory data in the context of their chronic comorbidities is felt to place them at high risk for further clinical deterioration. Furthermore, it is not anticipated that the patient will be medically stable for discharge from the hospital within 2 midnights of admission.   * I certify that at the point of admission it is my clinical judgment that the patient will require inpatient hospital care spanning beyond 2 midnights from the point of admission due to high intensity of service, high risk for further deterioration and high frequency of surveillance required.*  Author: Steffanie Rainwater, MD 10/24/2023 6:00 PM  For on call review [email or web address].

## 2023-10-24 NOTE — ED Provider Notes (Signed)
[place] EMERGENCY DEPARTMENT AT [facility] Provider Note   CSN: [id number] Arrival date & time: 10/24/23  1408     History {Add pertinent medical, surgical, social history, OB history to HPI:1} No chief complaint on file.   Caleb Simmons is a 72 y.o. male.  The history is provided by the patient, the EMS personnel and medical records. No language interpreter was used.     72 year old male significant history of end-stage renal disease currently on hemodialysis, PAD, prior stroke, anemia brought here via EMS from home with concerns of fever.  History obtained through EMS and through patient.  Patient was at dialysis session today and finished for dialysis however he was noted to have fever there.  Patient states he also had fever at home as high as 101.4 and wife did give him some Tylenol earlier today.  Patient states overall he just does not feel well patient states "I felt sick".  He does not endorse any significant headache, runny nose sneezing or coughing no chest pain or shortness of breath no abdominal pain no urinary discomfort.  He still making urine.  He endorsed some episodes of nonbloody diarrhea.  Denies any recent antibiotic use.  He is currently on chemotherapy for his amyloidosis.  Home Medications Prior to Admission medications   Medication Sig Start Date End Date Taking? Authorizing Provider  acetaminophen (TYLENOL) 500 MG tablet Take 1,000 mg by mouth every 6 (six) hours as needed for moderate pain.    [provider]  ALPRAZolam (XANAX) 1 MG tablet TAKE 1 TABLET BY MOUTH EVERY 8 HOURS AS NEEDED FOR ANXIETY OR SLEEP 10/14/23   Pickenpack-Cousar, Arty Baumgartner, NP  aspirin 81 MG EC tablet Take 1 tablet (81 mg total) by mouth daily. Swallow whole. 01/20/22   Almon Hercules, MD  cetirizine (ZYRTEC) 10 MG tablet Take 10 mg by mouth daily.    [provider]  dexamethasone (DECADRON) 4 MG tablet Take 5 tablets (20 mg total) by mouth once a week. Take  1-2 hour before chemo injection 03/27/23   [name], MD  diclofenac Sodium (VOLTAREN) 1 % GEL Apply 1 Application topically 4 (four) times daily as needed (pain).    [provider]  dronabinol (MARINOL) 2.5 MG capsule TAKE 1 CAPSULE BY MOUTH EVERY DAY IN THE AFTERNOON 09/29/23   Pickenpack-Cousar, Arty Baumgartner, NP  ezetimibe (ZETIA) 10 MG tablet Take 1 tablet (10 mg total) by mouth daily. 08/13/22   Laurey Morale, MD  folic acid (FOLVITE) 1 MG tablet TAKE 2 TABLETS BY MOUTH ONCE DAILY 10/15/23   [name], MD  Melatonin 10 MG TABS Take 10 mg by mouth at bedtime.    [provider]  metoprolol succinate (TOPROL-XL) 25 MG 24 hr tablet TAKE 1/2 TABLET BY MOUTH EVERY DAY 08/21/23   Laurey Morale, MD  midodrine (PROAMATINE) 5 MG tablet TAKE 1 TABLET BY MOUTH 3 TIMES DAILY WITH MEALS 08/11/23   Laurey Morale, MD  mirtazapine (REMERON) 30 MG tablet TAKE 1 TABLET BY MOUTH AT BEDTIME 10/15/23   Pickenpack-Cousar, Athena N, NP  ondansetron (ZOFRAN) 4 MG tablet TAKE 1 TABLET BY MOUTH EVERY 8 HOURS AS NEEDED FOR NAUSEA AND VOMITING 03/14/23   Pickenpack-Cousar, Arty Baumgartner, NP  oxyCODONE (ROXICODONE) 15 MG immediate release tablet Take 1 tablet (15 mg total) by mouth every 6 (six) hours as needed. 10/08/23   Pickenpack-Cousar, Arty Baumgartner, NP  pantoprazole (PROTONIX) 40 MG tablet TAKE 1 TABLET BY MOUTH  2 TIMES DAILY BEFORE MEALS 10/15/23   [name], MD  prochlorperazine (COMPAZINE) 5 MG tablet TAKE 1 TABLET BY MOUTH EVERY 6 HOURS AS NEEDED FOR NAUSEA AND VOMITING 09/08/23   [name], MD  STIMULANT LAXATIVE 8.6-50 MG tablet TAKE 1 TABLET BY MOUTH EVERY DAY 05/09/23   Pickenpack-Cousar, Arty Baumgartner, NP  tamsulosin (FLOMAX) 0.4 MG CAPS capsule TAKE 1 CAPSULE BY MOUTH ONCE DAILY 10/15/23   [name], MD  zolpidem (AMBIEN) 5 MG tablet TAKE 1 TABLET BY MOUTH NIGHTLY AT BEDTIME AS NEEDED FOR SLEEP 08/13/23   Pickenpack-Cousar, Arty Baumgartner, NP  zolpidem (AMBIEN) 5 MG tablet Take 1 tablet (5 mg total) by mouth at  bedtime as needed. for sleep 10/08/23   Pickenpack-Cousar, Arty Baumgartner, NP      Allergies    Patient has no known allergies.    Review of Systems   Review of Systems  All other systems reviewed and are negative.   Physical Exam Updated Vital Signs BP 124/62 (BP Location: Right Arm)   Pulse 82   Temp 98.9 F (37.2 C) (Oral)   Resp 15   SpO2 100%  Physical Exam Vitals and nursing note reviewed.  Constitutional:      General: He is not in acute distress.    Appearance: He is well-developed. He is ill-appearing.  HENT:     Head: Normocephalic and atraumatic.  Eyes:     Extraocular Movements: Extraocular movements intact.     Conjunctiva/sclera: Conjunctivae normal.     Pupils: Pupils are equal, round, and reactive to light.  Neck:     Vascular: No carotid bruit.  Cardiovascular:     Rate and Rhythm: Tachycardia present.     Pulses: Normal pulses.     Heart sounds: Normal heart sounds.  Pulmonary:     Breath sounds: Rales (Faint crackles heard at lung bases) present.  Abdominal:     Palpations: Abdomen is soft.     Tenderness: There is no abdominal tenderness.  Genitourinary:    Comments: Chaperone present during exam.  Normal rectal tone no obvious mass normal color stool on glove no stool impaction no gross bleeding Musculoskeletal:     Cervical back: Normal range of motion and neck supple. No rigidity or tenderness.     Right lower leg: No edema.     Left lower leg: No edema.  Lymphadenopathy:     Cervical: No cervical adenopathy.  Skin:    Findings: No rash.  Neurological:     Mental Status: He is alert and oriented to person, place, and time.     ED Results / Procedures / Treatments   Labs (all labs ordered are listed, but only abnormal results are displayed) Labs Reviewed  COMPREHENSIVE METABOLIC PANEL - Abnormal; Notable for the following components:      Result Value   Sodium 133 (*)    Chloride 97 (*)    Glucose, Bld 126 (*)    Creatinine, Ser 3.51  (*)    Calcium 7.9 (*)    Total Protein 5.3 (*)    Albumin 2.8 (*)    GFR, Estimated 18 (*)    All other components within normal limits  CBC WITH DIFFERENTIAL/PLATELET - Abnormal; Notable for the following components:   WBC 20.2 (*)    RBC 2.61 (*)    Hemoglobin 7.9 (*)    HCT 24.7 (*)    Platelets 145 (*)    Neutro Abs 19.0 (*)    Lymphs Abs 0.4 (*)  Abs Immature Granulocytes 0.35 (*)    All other components within normal limits  POC OCCULT BLOOD, ED - Abnormal; Notable for the following components:   Fecal Occult Bld POSITIVE (*)    All other components within normal limits  CULTURE, BLOOD (ROUTINE X 2)  CULTURE, BLOOD (ROUTINE X 2)  RESP PANEL BY RT-PCR (RSV, FLU A&B, COVID)  RVPGX2  PROTIME-INR  URINALYSIS, W/ REFLEX TO CULTURE (INFECTION SUSPECTED)  I-STAT CG4 LACTIC ACID, ED  I-STAT CG4 LACTIC ACID, ED    EKG EKG Interpretation Date/Time:  Friday October 24 2023 14:44:24 EDT Ventricular Rate:  75 PR Interval:  187 QRS Duration:  88 QT Interval:  450 QTC Calculation: 503 R Axis:   -33  Text Interpretation: Sinus rhythm Atrial premature complex Left axis deviation Anterior infarct, old Borderline repolarization abnormality Prolonged QT interval Confirmed by Fulton Reek [phone]) on 10/24/2023 2:48:49 PM  Radiology No results found.  Procedures .Critical Care  Performed by: Fayrene Helper, PA-C Authorized by: Fayrene Helper, PA-C   Critical care provider statement:    Critical care time (minutes):  30   Critical care was time spent personally by me on the following activities:  Development of treatment plan with patient or surrogate, discussions with consultants, evaluation of patient's response to treatment, examination of patient, ordering and review of laboratory studies, ordering and review of radiographic studies, ordering and performing treatments and interventions, pulse oximetry, re-evaluation of patient's condition and review of old charts   {Document  cardiac monitor, telemetry assessment procedure when appropriate:1}  Medications Ordered in ED Medications  ceFEPIme (MAXIPIME) 2 g in sodium chloride 0.9 % 100 mL IVPB (0 g Intravenous Stopped 10/24/23 1511)  vancomycin (VANCOREADY) IVPB 1250 mg/250 mL (1,250 mg Intravenous New Bag/Given 10/24/23 1515)    ED Course/ Medical Decision Making/ A&P   {   Click here for ABCD2, HEART and other calculatorsREFRESH Note before signing :1}                              Medical Decision Making Amount and/or Complexity of Data Reviewed Labs: ordered. Radiology: ordered.  Risk Prescription drug management.   BP 124/62 (BP Location: Right Arm)   Pulse 82   Temp 98.9 F (37.2 C) (Oral)   Resp 15   SpO2 100%   21:64 PM   72 year old male significant history of end-stage renal disease currently on hemodialysis, PAD, prior stroke, anemia brought here via EMS from home with concerns of fever.  History obtained through EMS and through patient.  Patient was at dialysis session today and finished for dialysis however he was noted to have fever there.  Patient states he also had fever at home as high as 101.4 and wife did give him some Tylenol earlier today.  Patient states overall he just does not feel well patient states "I felt sick".  He does not endorse any significant headache, runny nose sneezing or coughing no chest pain or shortness of breath no abdominal pain no urinary discomfort.  He still making urine.  He endorsed some episodes of nonbloody diarrhea.  Denies any recent antibiotic use.  He is currently on chemotherapy for his amyloidosis.  On exam this is an ill-appearing male laying in bed in no acute discomfort.  Heart with mild tachycardia, lungs with faint crackles at the lung bases, abdomen is soft nontender no peripheral edema he is able to move all 4 extremities with equal effort.  He does have a Port-A-Cath in his right upper chest wall it is nontender to palpation without obvious signs  of infection.  -Labs ordered, independently viewed and interpreted by me.  Labs remarkable for WBC 20.2.  normal lactic acid.  Hgb 7.9, I checked hemoccult, no gross rectal bleeding however hemoccult positive.  Will consult GI. -The patient was maintained on a cardiac monitor.  I personally viewed and interpreted the cardiac monitored which showed an underlying rhythm of: NSR -Imaging independently viewed and interpreted by me and I agree with radiologist's interpretation.  Result remarkable for *** -This patient presents to the ED for concern of fever, this involves an extensive number of treatment options, and is a complaint that carries with it a high risk of complications and morbidity.  The differential diagnosis includes covid, flu, rsv, pneumonia, infected cath, colitis, diverticulitis, appendicitis, uti -Co morbidities that complicate the patient evaluation includes amyloidosis,  -Treatment includes broad spectrum abx including vanc/cefepime -Reevaluation of the patient after these medicines showed that the patient improved -PCP office notes or outside notes reviewed -Discussion with specialist [facility] who will be involve in pt care. I have also consulted with [facility] who agrees to see and will admit pt for further care.  -Escalation to admission/observation considered: patient is agreeable with admission.   Patient will be admitted for fever of unknown origin.  No nuchal rigidity concerning for meningitis.  Abdominal exam fairly unremarkable.  Respiratory panel have not resulted yet, chest x-ray obtained but have not been read yet.  On wet read I do not see anything obvious.  I have also request for urinalysis likely will need In-N-Out cath to obtain urine however patient denies any urinary symptoms.  Hospice agrees to see and evaluate patient.  Due to leukocytosis, doubt neutropenic fever.  {Document critical care time when appropriate:1} {Document review of labs and  clinical decision tools ie heart score, Chads2Vasc2 etc:1}  {Document your independent review of radiology images, and any outside records:1} {Document your discussion with family members, caretakers, and with consultants:1} {Document social determinants of health affecting pt's care:1} {Document your decision making why or why not admission, treatments were needed:1} Final Clinical Impression(s) / ED Diagnoses Final diagnoses:  Fever of unknown origin (FUO)  Anemia, unspecified type  Gastrointestinal hemorrhage, unspecified gastrointestinal hemorrhage type    Rx / DC Orders ED Discharge Orders     None

## 2023-10-24 NOTE — Progress Notes (Signed)
ED Pharmacy Antibiotic Sign Off An antibiotic consult was received from an ED provider for cefepime and vancomycin per pharmacy dosing for sepsis. A chart review was completed to assess appropriateness.  The following one time order(s) were placed per pharmacy consult:  cefepime 2000 mg x 1 dose vancomycin 1250  mg x 1 dose  Further antibiotic and/or antibiotic pharmacy consults should be ordered by the admitting provider if indicated.   Thank you for allowing pharmacy to be a part of this patient's care.   Delmar Landau, PharmD, BCPS 10/24/2023 2:25 PM ED Clinical Pharmacist -  [phone]

## 2023-10-24 NOTE — ED Provider Notes (Incomplete)
Febrile, tachy at home.  Got Tylenol.  Finished HD today.  Hx amyloidosis with recent chemo.

## 2023-10-24 NOTE — ED Notes (Signed)
ED TO INPATIENT HANDOFF REPORT  ED Nurse Name and Phone #: Osvaldo Shipper RN [phone]  S Name/Age/Gender Caleb Simmons 72 y.o. male Room/Bed: 005C/005C  Code Status   Code Status: Prior  Home/SNF/Other Home Patient oriented to: self, place, time, and situation Is this baseline? Yes   Triage Complete: Triage complete  Chief Complaint Sepsis; Dislysis pt  Triage Note Fever of 101.6 taken at home. Dialysis MF only and finished session today. Tachy at 114 bpm. Alert and oriented x 4. Last chemo session was last week for bone cancer.   Allergies Allergies  Allergen Reactions   Iron Sucrose Nausea And Vomiting    Level of Care/Admitting Diagnosis ED Disposition     ED Disposition  Admit   Condition  --   Comment  The patient appears reasonably stabilized for admission considering the current resources, flow, and capabilities available in the ED at this time, and I doubt any other [facility] requiring further screening and/or treatment in the ED prior to admission is  present.          B Medical/Surgery History Past Medical History:  Diagnosis Date   Cancer [facility])    Past Surgical History:  Procedure Laterality Date   APPENDECTOMY     IR ANGIO INTRA EXTRACRAN SEL COM CAROTID INNOMINATE BILAT MOD SED  01/16/2022   IR ANGIO INTRA EXTRACRAN SEL COM CAROTID INNOMINATE BILAT MOD SED  07/16/2022   IR ANGIO VERTEBRAL SEL SUBCLAVIAN INNOMINATE BILAT MOD SED  01/16/2022   IR ANGIO VERTEBRAL SEL SUBCLAVIAN INNOMINATE UNI R MOD SED  07/16/2022   IR ANGIO VERTEBRAL SEL VERTEBRAL UNI L MOD SED  07/16/2022   IR CT HEAD LTD  01/18/2022   IR EMBO ART  VEN HEMORR LYMPH EXTRAV  INC GUIDE ROADMAPPING  08/30/2021   IR FLUORO GUIDE CV LINE RIGHT  08/30/2021   IR FLUORO GUIDE CV LINE RIGHT  09/18/2021   IR FLUORO GUIDE CV LINE RIGHT  05/02/2022   IR PERCUTANEOUS ART THROMBECTOMY/INFUSION INTRACRANIAL INC DIAG ANGIO  01/18/2022   IR RADIOLOGIST EVAL & MGMT  08/02/2022   IR RENAL SELECTIVE  UNI INC S&I MOD  SED  08/31/2021   IR US GUIDE VASC ACCESS RIGHT  08/30/2021   IR US GUIDE VASC ACCESS RIGHT  08/30/2021   IR US GUIDE VASC ACCESS RIGHT  09/18/2021   IR US GUIDE VASC ACCESS RIGHT  01/16/2022   IR US GUIDE VASC ACCESS RIGHT  07/16/2022   IR VENOCAVAGRAM SVC  05/02/2022   RADIOLOGY WITH ANESTHESIA N/A 01/18/2022   Procedure: Cerebral angioplasty with possible stenting;  Surgeon: Julieanne Cotton, MD;  Location: [facility] OR;  Service: Radiology;  Laterality: N/A;     A IV Location/Drains/Wounds Patient Lines/Drains/Airways Status     Active Line/Drains/Airways     Name Placement date Placement time Site Days   Peripheral IV 10/24/23 20 G Anterior;Right Forearm 10/24/23  1423  Forearm  less than 1   Hemodialysis Catheter Right Internal jugular Double lumen Permanent (Tunneled) 05/02/22  1115  Internal jugular  540            Intake/Output Last 24 hours  Intake/Output Summary (Last 24 hours) at 10/24/2023 1748 Last data filed at 10/24/2023 1737 Gross per 24 hour  Intake 352.64 ml  Output --  Net 352.64 ml    Labs/Imaging Results for orders placed or performed during the hospital encounter of 10/24/23 (from the past 48 hour(s))  Comprehensive metabolic panel     Status: Abnormal  Collection Time: 10/24/23  2:19 PM  Result Value Ref Range   Sodium 133 (L) 135 - 145 mmol/L   Potassium 3.6 3.5 - 5.1 mmol/L   Chloride 97 (L) 98 - 111 mmol/L   CO2 22 22 - 32 mmol/L   Glucose, Bld 126 (H) 70 - 99 mg/dL    Comment: Glucose reference range applies only to samples taken after fasting for at least 8 hours.   BUN 21 8 - 23 mg/dL   Creatinine, Ser 1.61 (H) 0.61 - 1.24 mg/dL   Calcium 7.9 (L) 8.9 - 10.3 mg/dL   Total Protein 5.3 (L) 6.5 - 8.1 g/dL   Albumin 2.8 (L) 3.5 - 5.0 g/dL   AST 41 15 - 41 U/L   ALT 16 0 - 44 U/L   Alkaline Phosphatase 72 38 - 126 U/L   Total Bilirubin 0.9 0.3 - 1.2 mg/dL   GFR, Estimated 18 (L) >60 mL/min    Comment: (NOTE) Calculated using the CKD-EPI Creatinine  Equation (2021)    Anion gap 14 5 - 15    Comment: Performed at [facility], [address] [place], [place] [address]  CBC with Differential     Status: Abnormal   Collection Time: 10/24/23  2:19 PM  Result Value Ref Range   WBC 20.2 (H) 4.0 - 10.5 K/uL   RBC 2.61 (L) 4.22 - 5.81 MIL/uL   Hemoglobin 7.9 (L) 13.0 - 17.0 g/dL   HCT 54.0 (L) 98.1 - 19.1 %   MCV 94.6 80.0 - 100.0 fL   MCH 30.3 26.0 - 34.0 pg   MCHC 32.0 30.0 - 36.0 g/dL   RDW 47.8 29.5 - 62.1 %   Platelets 145 (L) 150 - 400 K/uL   nRBC 0.0 0.0 - 0.2 %   Neutrophils Relative % 94 %   Neutro Abs 19.0 (H) 1.7 - 7.7 K/uL   Lymphocytes Relative 2 %   Lymphs Abs 0.4 (L) 0.7 - 4.0 K/uL   Monocytes Relative 2 %   Monocytes Absolute 0.5 0.1 - 1.0 K/uL   Eosinophils Relative 0 %   Eosinophils Absolute 0.0 0.0 - 0.5 K/uL   Basophils Relative 0 %   Basophils Absolute 0.0 0.0 - 0.1 K/uL   Immature Granulocytes 2 %   Abs Immature Granulocytes 0.35 (H) 0.00 - 0.07 K/uL    Comment: Performed at [facility], [address] [place], [place] [address]  Protime-INR     Status: None   Collection Time: 10/24/23  2:19 PM  Result Value Ref Range   Prothrombin Time 14.6 11.4 - 15.2 seconds   INR 1.1 0.8 - 1.2    Comment: (NOTE) INR goal varies based on device and disease states. Performed at [facility], [address], [place], [place] [address]   I-Stat Lactic Acid, ED     Status: None   Collection Time: 10/24/23  2:32 PM  Result Value Ref Range   Lactic Acid, Venous 1.2 0.5 - 1.9 mmol/L  Culture, blood (Routine x 2)     Status: None (Preliminary result)   Collection Time: 10/24/23  2:32 PM   Specimen: BLOOD RIGHT ARM  Result Value Ref Range   Specimen Description BLOOD RIGHT ARM    Special Requests      BOTTLES DRAWN AEROBIC AND ANAEROBIC Blood Culture results may not be optimal due to an excessive volume of blood received in culture bottles Performed at [facility], [address]  [place], [place] [address]    Culture PENDING    Report Status PENDING   Resp panel by RT-PCR (RSV, Flu A&B, Covid) Peripheral     Status: None   Collection Time: 10/24/23  2:41 PM   Specimen: Peripheral; Nasal Swab  Result Value Ref Range   SARS Coronavirus 2 by RT PCR NEGATIVE NEGATIVE   Influenza A by PCR NEGATIVE NEGATIVE   Influenza B by PCR NEGATIVE NEGATIVE    Comment: (NOTE) The Xpert Xpress SARS-CoV-2/FLU/RSV plus assay is intended as an aid in the diagnosis of influenza from Nasopharyngeal swab specimens and should not be used as a sole basis for treatment. Nasal washings and aspirates are unacceptable for Xpert Xpress SARS-CoV-2/FLU/RSV testing.  Fact Sheet for Patients: BloggerCourse.com  Fact Sheet for Healthcare Providers: SeriousBroker.it  This test is not yet approved or cleared by the [place] FDA and has been authorized for detection and/or diagnosis of SARS-CoV-2 by FDA under an Emergency Use Authorization (EUA). This EUA will remain in effect (meaning this test can be used) for the duration of the COVID-19 declaration under Section 564(b)(1) of the Act, 21 U.S.C. section 360bbb-3(b)(1), unless the authorization is terminated or revoked.     Resp Syncytial Virus by PCR NEGATIVE NEGATIVE    Comment: (NOTE) Fact Sheet for Patients: BloggerCourse.com  Fact Sheet for Healthcare Providers: SeriousBroker.it  This test is not yet approved or cleared by the [place] FDA and has been authorized for detection and/or diagnosis of SARS-CoV-2 by FDA under an Emergency Use Authorization (EUA). This EUA will remain in effect (meaning this test can be used) for the duration of the COVID-19 declaration under Section 564(b)(1) of the Act, 21 U.S.C. section 360bbb-3(b)(1), unless the authorization is terminated or revoked.  Performed at [facility], [address], [place], [place] [address]   POC occult blood, ED     Status: Abnormal   Collection Time: 10/24/23  2:59 PM  Result Value Ref Range   Fecal Occult Bld POSITIVE (A) NEGATIVE   DG Chest Portable 1 View  Result Date: 10/24/2023 CLINICAL DATA:  Sepsis.  Fever.  Dialysis patient. EXAM: PORTABLE CHEST 1 VIEW COMPARISON:  Radiographs 12/19/2022 and 01/14/2022. FINDINGS: 1452 hours. Right IJ hemodialysis catheter tip extends to the superior cavoatrial junction. The heart size and mediastinal contours are stable. There is vascular congestion without definite edema, focal airspace disease, significant pleural effusion or pneumothorax. The bones appear unchanged, without acute findings. Telemetry leads overlie the chest. IMPRESSION: Vascular congestion without definite edema or focal airspace disease. Electronically Signed   By: Carey Bullocks M.D.   On: 10/24/2023 17:39    Pending Labs Unresulted Labs (From admission, onward)     Start     Ordered   10/24/23 1419  Culture, blood (Routine x 2)  BLOOD CULTURE X 2,   R (with STAT occurrences)      10/24/23 1418   10/24/23 1419  Urinalysis, w/ Reflex to Culture (Infection Suspected) -Urine, Clean Catch  Once,   URGENT       Question:  Specimen Source  Answer:  Urine, Clean Catch   10/24/23 1418            Vitals/Pain Today's Vitals   10/24/23 1414 10/24/23 1417  BP:  124/62  Pulse:  82  Resp:  15  Temp:  98.9 F (37.2 C)  TempSrc:  Oral  SpO2:  100%  PainSc: 0-No pain     Isolation  Precautions No active isolations  Medications Medications  ceFEPIme (MAXIPIME) 2 g in sodium chloride 0.9 % 100 mL IVPB (0 g Intravenous Stopped 10/24/23 1511)  vancomycin (VANCOREADY) IVPB 1250 mg/250 mL (0 mg Intravenous Stopped 10/24/23 1737)    Mobility walks with person assist     Focused Assessments Fever + CA   R Recommendations: See Admitting Provider Note  Report given to:   Additional Notes:

## 2023-10-24 NOTE — ED Notes (Signed)
X-ray at bedside

## 2023-10-25 DIAGNOSIS — F419 Anxiety disorder, unspecified: Secondary | ICD-10-CM | POA: Diagnosis not present

## 2023-10-25 DIAGNOSIS — E8581 Light chain (AL) amyloidosis: Secondary | ICD-10-CM | POA: Diagnosis not present

## 2023-10-25 DIAGNOSIS — R6510 Systemic inflammatory response syndrome (SIRS) of non-infectious origin without acute organ dysfunction: Secondary | ICD-10-CM | POA: Diagnosis not present

## 2023-10-25 DIAGNOSIS — N186 End stage renal disease: Secondary | ICD-10-CM

## 2023-10-25 DIAGNOSIS — I5032 Chronic diastolic (congestive) heart failure: Secondary | ICD-10-CM | POA: Insufficient documentation

## 2023-10-25 DIAGNOSIS — E785 Hyperlipidemia, unspecified: Secondary | ICD-10-CM

## 2023-10-25 DIAGNOSIS — I679 Cerebrovascular disease, unspecified: Secondary | ICD-10-CM

## 2023-10-25 DIAGNOSIS — D649 Anemia, unspecified: Secondary | ICD-10-CM | POA: Diagnosis not present

## 2023-10-25 DIAGNOSIS — E871 Hypo-osmolality and hyponatremia: Secondary | ICD-10-CM | POA: Insufficient documentation

## 2023-10-25 LAB — CBC
HCT: 23.8 % — ABNORMAL LOW (ref 39.0–52.0)
Hemoglobin: 7.9 g/dL — ABNORMAL LOW (ref 13.0–17.0)
MCH: 30.9 pg (ref 26.0–34.0)
MCHC: 33.2 g/dL (ref 30.0–36.0)
MCV: 93 fL (ref 80.0–100.0)
Platelets: 129 10*3/uL — ABNORMAL LOW (ref 150–400)
RBC: 2.56 MIL/uL — ABNORMAL LOW (ref 4.22–5.81)
RDW: 14.6 % (ref 11.5–15.5)
WBC: 11.4 10*3/uL — ABNORMAL HIGH (ref 4.0–10.5)
nRBC: 0 % (ref 0.0–0.2)

## 2023-10-25 LAB — RENAL FUNCTION PANEL
Albumin: 2.4 g/dL — ABNORMAL LOW (ref 3.5–5.0)
Anion gap: 12 (ref 5–15)
BUN: 32 mg/dL — ABNORMAL HIGH (ref 8–23)
CO2: 23 mmol/L (ref 22–32)
Calcium: 7.7 mg/dL — ABNORMAL LOW (ref 8.9–10.3)
Chloride: 98 mmol/L (ref 98–111)
Creatinine, Ser: 4.5 mg/dL — ABNORMAL HIGH (ref 0.61–1.24)
GFR, Estimated: 13 mL/min — ABNORMAL LOW (ref >60)
Glucose, Bld: 103 mg/dL — ABNORMAL HIGH (ref 70–99)
Phosphorus: 4.7 mg/dL — ABNORMAL HIGH (ref 2.5–4.6)
Potassium: 3.8 mmol/L (ref 3.5–5.1)
Sodium: 133 mmol/L — ABNORMAL LOW (ref 135–145)

## 2023-10-25 LAB — RESPIRATORY PANEL BY PCR

## 2023-10-25 LAB — URINALYSIS, W/ REFLEX TO CULTURE (INFECTION SUSPECTED)
Bacteria, UA: NONE SEEN
Bilirubin Urine: NEGATIVE
Glucose, UA: NEGATIVE mg/dL
Ketones, ur: NEGATIVE mg/dL
Leukocytes,Ua: NEGATIVE
Nitrite: NEGATIVE
Protein, ur: >=300 mg/dL — AB
Specific Gravity, Urine: 1.012 (ref 1.005–1.030)
pH: 7 (ref 5.0–8.0)

## 2023-10-25 MED ORDER — CHLORHEXIDINE GLUCONATE CLOTH 2 % EX PADS
6.0000 | MEDICATED_PAD | Freq: Every day | CUTANEOUS | Status: DC
  Administered 2023-10-25 – 2023-10-26 (×2): 6 via TOPICAL

## 2023-10-25 MED ORDER — VANCOMYCIN HCL 750 MG/150ML IV SOLN: 750.0000 mg | INTRAVENOUS | Status: DC

## 2023-10-25 MED ORDER — MAGNESIUM SULFATE 2 GM/50ML IV SOLN
2.0000 g | Freq: Once | INTRAVENOUS | Status: AC
  Administered 2023-10-25: 2 g via INTRAVENOUS
  Filled 2023-10-25: qty 50

## 2023-10-25 MED ORDER — SODIUM CHLORIDE 0.9 % IV SOLN
1.0000 g | INTRAVENOUS | Status: DC
  Administered 2023-10-25 – 2023-10-26 (×2): 1 g via INTRAVENOUS
  Filled 2023-10-25 (×2): qty 10

## 2023-10-25 MED ORDER — NEPRO/CARBSTEADY PO LIQD
237.0000 mL | Freq: Two times a day (BID) | ORAL | Status: DC
  Administered 2023-10-25 (×2): 237 mL via ORAL

## 2023-10-25 MED ORDER — ASPIRIN 81 MG PO TBEC
81.0000 mg | DELAYED_RELEASE_TABLET | Freq: Every day | ORAL | Status: DC
  Administered 2023-10-26: 81 mg via ORAL
  Filled 2023-10-25: qty 1

## 2023-10-25 NOTE — Progress Notes (Signed)
  Progress Note   Patient: Caleb Simmons WUJ:[id number] DOB: [birth year] DOA: 10/24/2023     1 DOS: the patient was seen and examined on 10/25/2023 at 9:32 AM      Brief hospital course: Mr. Glasscock is a 72 y.o. M with AL amyloidosis, ESRD on HD who presented with fever for a few days.      Assessment and Plan: * Suspected sepsis, unclear source Presented with tachycardia, leukocytosis, fever.  Urinalysis clear.  Chest x-ray, poor quality, patchy opacities, unclear significance, blood cultures pending. 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  FOBT reportedly positive, but no melena observed, hemoglobin stable overnight.  Doubt GI bleed.  Not severe enough to be hemolytic. - Trend hemoglobin  Hyponatremia Mild, asymptomatic - Dialysis Monday and Friday  Hypomagnesemia - Supplement magnesium  Cerebrovascular disease - Continue aspirin  Anxiety - Continue alprazolam, mirtazapine  Chronic heart failure with preserved ejection fraction (HFpEF) (HCC) Appears euvolemic to dehydrated.  Last EF 55 to 60% 1 year ago. - Volume status per HD - Continue metoprolol  Hyperlipidemia Not on statin  PAD (peripheral artery disease) (HCC) - Resume aspirin  ESRD (end stage renal disease) (HCC) Dialyzes MF only, last yesterday. No acute needs - Consult Neph for dialysis if still here on Monday - Continue midodrine  AL amyloidosis (HCC) Finished daratumumab 1 month ago, with good partial response, feels generally weaker and more malaise since then.            Subjective: Patient feels "terrible", he is weak and tired.  He has had no confusion, he has no sore throat, runny nose, abdominal pain, urinary symptoms.  He really does not make much urine.  He has had some cough, but he says this is chronic and are "smoker's cough".  He did have a fever overnight, this is  improved now.     Physical Exam: BP 135/74 (BP Location: Left Arm)   Pulse 85   Temp 99.6 F (37.6 C) (Oral)   Resp 18   Ht 5\' 10"  (1.778 m)   Wt 62.8 kg   SpO2 94%   BMI 19.87 kg/m   Frail elderly adult male, lying in bed, appears weak and tired RRR, no murmurs, no peripheral edema Respiratory rate normal, lungs clear without rales or wheezes Abdomen soft without tenderness or guarding in any quadrants, no distention Attentive to questions, oriented to person, place, time, and situation, but listless, sluggish, severe generalized weakness. face symmetric, speech fluent    Data Reviewed: Basic metabolic panel shows hemoglobin 7.9 Respiratory virus panel pending Patient metabolic panel consistent with renal failure  Family Communication: Girlfriend at the bedside    Disposition: Status is: Inpatient Patient was admitted with fever.  Given his end-stage renal disease, amyloidosis, poor functional status, chronic heart failure and vascular disease, he is too high risk for serious bacterial infection to discharge safely with outpatient monitoring we will continue empiric antibiotics        Author: Alberteen Sam, MD 10/25/2023 10:43 AM  For on call review [email or web address].

## 2023-10-25 NOTE — Plan of Care (Signed)

## 2023-10-25 NOTE — Assessment & Plan Note (Addendum)
Baseline 9-10, currently 7s.  FOBT reportedly positive, but no melena observed, hemoglobin stable overnight.  Doubt GI bleed.  Not severe enough to be hemolytic. - Trend hemoglobin

## 2023-10-25 NOTE — Progress Notes (Signed)
Pharmacy Antibiotic Note  Caleb Simmons is a 72 y.o. male admitted on 10/24/2023 with sepsis.  Pharmacy has been consulted for cefepime and vancomycin dosing.  He presented with 2 days of fevers and chills with a temp of 101.6 at home. He endorses watery stools, fatigue and weakness. WBC count was initially elevated to 20.2 but has decreased to 11.4. Last fever was on 11/1 at 1950 and was 100.62F. He does have a history of group B strep bacteremia about 2 years ago and did recently complete 2 years of chemotherapy about 2 weeks ago.   Of note, his PMH is significant for ESRD on M, F HD. He completed dialysis 11/1 at outpatient facility before presenting to the ED. Noted plan for next HD on 11/4. He received cefepime 2 g x 1 and vancomycin 1250 mg x1 in the ED on 11/1.  Plan: Vancomycin 750 mg IV QHD M, F Cefepime 1 g IV Q24H  Continue to monitor clinical progression and ability to narrow therapy Monitor HD schedule and adjust abx as necessary  Height: 5\' 10"  (177.8 cm) Weight: 62.8 kg (138 lb 7.2 oz) IBW/kg (Calculated) : 73  Temp (24hrs), Avg:99.2 F (37.3 C), Min:98.2 F (36.8 C), Max:100.9 F (38.3 C)  Recent Labs  Lab 10/24/23 1419 10/24/23 1432 10/24/23 1835 10/25/23 0547  WBC 20.2*  --   --  11.4*  CREATININE 3.51*  --   --  4.50*  LATICACIDVEN  --  1.2 1.3  --     Estimated Creatinine Clearance: 13 mL/min (A) (by C-G formula based on SCr of 4.5 mg/dL (H)).    Allergies  Allergen Reactions   Iron Sucrose Nausea And Vomiting    Antimicrobials this admission: Vancomycin 11/1 >>  Cefepime 11/1 >>   Microbiology results: 11/1 BCx: pending 11/1 COVID/Flu RVP: neg 11/2 RVP: pending  Thank you for allowing pharmacy to be a part of this patient's care.  Lennie Muckle, PharmD PGY1 Pharmacy Resident 10/25/2023 8:34 AM

## 2023-10-25 NOTE — Hospital Course (Signed)
Caleb Simmons is a 72 y.o. M with AL amyloidosis, ESRD on HD who presented with fever for a few days.

## 2023-10-25 NOTE — Assessment & Plan Note (Signed)
Not on statin 

## 2023-10-25 NOTE — Assessment & Plan Note (Signed)
Finished daratumumab 1 month ago, with good partial response, feels generally weaker and more malaise since then.

## 2023-10-25 NOTE — Assessment & Plan Note (Addendum)
Dialyzes MF only, last yesterday. No acute needs - Consult Neph for dialysis if still here on Monday - Continue midodrine

## 2023-10-25 NOTE — Assessment & Plan Note (Signed)
Presented with tachycardia, leukocytosis, fever.  Urinalysis clear.  Chest x-ray, poor quality, patchy opacities, unclear significance, blood cultures pending. - Continue vancomycin and cefepime - Follow blood cultures -Obtain respiratory virus panel

## 2023-10-25 NOTE — Assessment & Plan Note (Signed)
Resume aspirin

## 2023-10-25 NOTE — Assessment & Plan Note (Signed)
Appears euvolemic to dehydrated.  Last EF 55 to 60% 1 year ago. - Volume status per HD - Continue metoprolol

## 2023-10-25 NOTE — Assessment & Plan Note (Signed)
Mild, asymptomatic - Dialysis Monday and Friday

## 2023-10-25 NOTE — Assessment & Plan Note (Signed)
-   Supplement magnesium 

## 2023-10-25 NOTE — Assessment & Plan Note (Signed)
-   Continue alprazolam, mirtazapine

## 2023-10-25 NOTE — Assessment & Plan Note (Signed)
Continue aspirin 

## 2023-10-26 DIAGNOSIS — N186 End stage renal disease: Secondary | ICD-10-CM | POA: Diagnosis not present

## 2023-10-26 DIAGNOSIS — D631 Anemia in chronic kidney disease: Secondary | ICD-10-CM | POA: Diagnosis not present

## 2023-10-26 DIAGNOSIS — E8581 Light chain (AL) amyloidosis: Secondary | ICD-10-CM

## 2023-10-26 DIAGNOSIS — R6510 Systemic inflammatory response syndrome (SIRS) of non-infectious origin without acute organ dysfunction: Secondary | ICD-10-CM | POA: Diagnosis not present

## 2023-10-26 LAB — COMPREHENSIVE METABOLIC PANEL
ALT: 13 U/L (ref 0–44)
AST: 37 U/L (ref 15–41)
Albumin: 2.2 g/dL — ABNORMAL LOW (ref 3.5–5.0)
Alkaline Phosphatase: 58 U/L (ref 38–126)
Anion gap: 15 (ref 5–15)
BUN: 53 mg/dL — ABNORMAL HIGH (ref 8–23)
CO2: 22 mmol/L (ref 22–32)
Calcium: 7.9 mg/dL — ABNORMAL LOW (ref 8.9–10.3)
Chloride: 97 mmol/L — ABNORMAL LOW (ref 98–111)
Creatinine, Ser: 5.45 mg/dL — ABNORMAL HIGH (ref 0.61–1.24)
GFR, Estimated: 10 mL/min — ABNORMAL LOW (ref >60)
Glucose, Bld: 109 mg/dL — ABNORMAL HIGH (ref 70–99)
Potassium: 4.4 mmol/L (ref 3.5–5.1)
Sodium: 134 mmol/L — ABNORMAL LOW (ref 135–145)
Total Bilirubin: 0.6 mg/dL (ref 0.3–1.2)
Total Protein: 4.7 g/dL — ABNORMAL LOW (ref 6.5–8.1)

## 2023-10-26 LAB — CBC
HCT: 22.6 % — ABNORMAL LOW (ref 39.0–52.0)
Hemoglobin: 7.4 g/dL — ABNORMAL LOW (ref 13.0–17.0)
MCH: 30.5 pg (ref 26.0–34.0)
MCHC: 32.7 g/dL (ref 30.0–36.0)
MCV: 93 fL (ref 80.0–100.0)
Platelets: 131 10*3/uL — ABNORMAL LOW (ref 150–400)
RBC: 2.43 MIL/uL — ABNORMAL LOW (ref 4.22–5.81)
RDW: 14.7 % (ref 11.5–15.5)
WBC: 9.1 10*3/uL (ref 4.0–10.5)
nRBC: 0 % (ref 0.0–0.2)

## 2023-10-26 MED ORDER — FERROUS SULFATE 325 (65 FE) MG PO TBEC: 325.0000 mg | DELAYED_RELEASE_TABLET | ORAL | Status: DC

## 2023-10-26 MED ORDER — NEPRO/CARBSTEADY PO LIQD: 237.0000 mL | Freq: Two times a day (BID) | ORAL | Status: AC

## 2023-10-26 MED ORDER — ALPRAZOLAM 0.5 MG PO TABS: 1.0000 mg | ORAL_TABLET | Freq: Every evening | ORAL | Status: DC | PRN

## 2023-10-26 MED ORDER — CEFDINIR 300 MG PO CAPS: ORAL_CAPSULE | ORAL | 0 refills | Status: DC

## 2023-10-26 NOTE — Progress Notes (Signed)
Pt ambulated independently in hallway around 150 feet with staff walking nearby. Pt had no c/o dizziness/lightheadedness/off balance.

## 2023-10-26 NOTE — Evaluation (Signed)
Physical Therapy Evaluation Patient Details Name: Caleb Simmons MRN: [id number] DOB: 02-Nov-1951 Today's Date: 10/26/2023  History of Present Illness  Patient is a 72 y/o male admitted 10/24/23 due to weakness, fever, fall and watery stools.  Found to have sepsis thought to be from possible PNA.  PMH positive for amyloidosis, ESRD on HD, PAD, CVA, and anemia.  Clinical Impression  Patient presents with decreased mobility due to generalized weakness, decreased balance and decreased activity tolerance.  At baseline pretty independent though has a friend who lives with him takes him to dialysis.  States has not eaten well and been in the bed during hospital stay so feels weaker, though once home plans to eat better as he prefers food outside hospital.  Also discussed energy conservation, slow to rise and other fall prevention measures including shower seat.  Patient able to ambulate in hallway and negotiate stairs appropriate for home entry.  Feel should progress without follow up PT at this time though DGI showed high fall risk (15/24, mainly due to slower cautious technique.)  Encouraged to use walker available at home when feeling weaker, and to rise slowly though orthostatic testing was normal this session.  Stable for home at this time without follow up PT.    Orthostatic VS for the past 24 hrs (Last 3 readings):  BP- Lying Pulse- Lying BP- Sitting Pulse- Sitting BP- Standing at 0 minutes Pulse- Standing at 0 minutes  10/26/23 1100 (!) 146/91 67 143/90 72 143/79 72         If plan is discharge home, recommend the following: Assist for transportation;Assistance with cooking/housework   Can travel by private vehicle        Equipment Recommendations None recommended by PT  Recommendations for Other Services       Functional Status Assessment Patient has had a recent decline in their functional status and demonstrates the ability to make significant improvements in function in a reasonable and  predictable amount of time.     Precautions / Restrictions Precautions Precautions: Fall      Mobility  Bed Mobility Overal bed mobility: Modified Independent             General bed mobility comments: increased time and efforful    Transfers Overall transfer level: Modified independent Equipment used: None               General transfer comment: slow to rise, mild unsteadiness, but no physical help    Ambulation/Gait Ambulation/Gait assistance: Supervision, Contact guard assist Gait Distance (Feet): 200 Feet Assistive device: None Gait Pattern/deviations: Step-through pattern, Decreased stride length       General Gait Details: mild unsteadiness close to rail but not using, CGA initially progressing to S  Stairs Stairs: Yes Stairs assistance: Supervision Stair Management: One rail Right, Step to pattern, Alternating pattern, Forwards Number of Stairs: 3 General stair comments: step through sequence to ascend, step to to descend  Wheelchair Mobility     Tilt Bed    Modified Rankin (Stroke Patients Only)       Balance Overall balance assessment: Needs assistance   Sitting balance-Leahy Scale: Good     Standing balance support: No upper extremity supported Standing balance-Leahy Scale: Good                   Standardized Balance Assessment Standardized Balance Assessment : Dynamic Gait Index   Dynamic Gait Index Level Surface: Mild Impairment Change in Gait Speed: Mild Impairment Gait with Horizontal Head  Turns: Moderate Impairment Gait with Vertical Head Turns: Mild Impairment Gait and Pivot Turn: Mild Impairment Step Over Obstacle: Mild Impairment Step Around Obstacles: Normal Steps: Moderate Impairment Total Score: 15       Pertinent Vitals/Pain Pain Assessment Pain Assessment: No/denies pain    Home Living Family/patient expects to be discharged to:: Private residence Living Arrangements:  Non-relatives/Friends Available Help at Discharge: Friend(s) Type of Home: House Home Access: Stairs to enter Entrance Stairs-Rails: Doctor, general practice of Steps: 3   Home Layout: One level Home Equipment: Agricultural consultant (2 wheels);Shower seat;Grab bars - tub/shower;Transport chair      Prior Function Prior Level of Function : Independent/Modified Independent             Mobility Comments: independent at baseline       Extremity/Trunk Assessment   Upper Extremity Assessment Upper Extremity Assessment: Generalized weakness    Lower Extremity Assessment Lower Extremity Assessment: Generalized weakness    Cervical / Trunk Assessment Cervical / Trunk Assessment: Other exceptions Cervical / Trunk Exceptions: cachexia  Communication   Communication Communication: No apparent difficulties  Cognition Arousal: Alert   Overall Cognitive Status: Within Functional Limits for tasks assessed                                          General Comments General comments (skin integrity, edema, etc.): Discussed safety at home with lighting, footwear, energy consevation strategies, encouraged to purchase shower seat and need for using his transport w/c when exiting dialysis.  Noted SpO2 100% and HR 99 after ambulation, see flowsheet for orthostatics.    Exercises     Assessment/Plan    PT Assessment Patient does not need any further PT services  PT Problem List         PT Treatment Interventions      PT Goals (Current goals can be found in the Care Plan section)  Acute Rehab PT Goals PT Goal Formulation: All assessment and education complete, DC therapy    Frequency       Co-evaluation               AM-PAC PT "6 Clicks" Mobility  Outcome Measure Help needed turning from your back to your side while in a flat bed without using bedrails?: None Help needed moving from lying on your back to sitting on the side of a flat bed without  using bedrails?: None Help needed moving to and from a bed to a chair (including a wheelchair)?: None Help needed standing up from a chair using your arms (e.g., wheelchair or bedside chair)?: None Help needed to walk in hospital room?: A Little Help needed climbing 3-5 steps with a railing? : A Little 6 Click Score: 22    End of Session Equipment Utilized During Treatment: Gait belt Activity Tolerance: Patient limited by fatigue Patient left: in bed;with call bell/phone within reach;with family/visitor present   PT Visit Diagnosis: Muscle weakness (generalized) (M62.81)    Time: 9528-4132 PT Time Calculation (min) (ACUTE ONLY): 25 min   Charges:   PT Evaluation $PT Eval Low Complexity: 1 Low PT Treatments $Self Care/Home Management: 8-22 PT General Charges $$ ACUTE PT VISIT: 1 Visit         Sheran Lawless, PT Acute Rehabilitation Services Office:[phone] 10/26/2023   Elray Mcgregor 10/26/2023, 11:30 AM

## 2023-10-26 NOTE — Discharge Summary (Signed)
Physician Discharge Summary   Patient: Caleb Simmons MRN: [id number] DOB: 1951-01-07  Admit date:     10/24/2023  Discharge date: 10/26/23  Discharge Physician: Alberteen Sam   PCP: [name], MD     Recommendations at discharge:  Follow up with Dr. Mosetta Putt in 1 week for follow up pneumonia  Dr. 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 Biederman is a 72 y.o. M with AL amyloidosis, ESRD on HD who presented with fever and weakness.    Patient had had progressive weakness since stopping daratumumab.  Then in the day leading up to admission, he had a fever, and weakness.  Finally, he tried to get up to go to the bathroom overnight, fell on the floor and lay there until morning, when family found him and brought him to the ER.  In the ER, he had leukocytosis, fever.  CXR showed patchy possible infiltrates.  Urinalysis clear.  Admitted on empiric antibiotics      * Suspected sepsis, suspect pneumonia Presented with tachycardia, leukocytosis, fever.  Urinalysis clear.  Chest x-ray, poor quality, patchy opacities.  Patient endorsed cough.  COVID, RVP20 negative.  Blood cultures were drawn and had no growth at 48 hours.  By then, patient felt well, stronger, ambulated without assistance, desired discharge home.  Was mentating at baseline, taking orals.  Temp < 100 F, heart rate < 100bpm, RR < 24, SpO2 at baseline.     Low suspicion for PsA, MRSA pneumonia.  Discharged to complete 7 days total with cefdinir.    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  No clinically observed or reported bleeding. Discharged on oral iron.   - Follow up with Hematology  - Okay to contineu low dose aspirin for now              The [facility] Controlled Substances Registry was reviewed for this patient prior to discharge.   Consultants: None   Disposition: Home health Diet recommendation:  Renal diet  DISCHARGE MEDICATION: Allergies as of 10/26/2023       Reactions   Iron Sucrose Nausea And Vomiting        Medication List     TAKE these medications    acetaminophen 500 MG tablet Commonly known as: TYLENOL Take 1,000 mg by mouth every 6 (six) hours as needed for moderate pain.   ALPRAZolam 1 MG tablet Commonly known as: XANAX TAKE 1 TABLET BY MOUTH EVERY 8 HOURS AS NEEDED FOR ANXIETY OR SLEEP What changed: See the new instructions.   aspirin EC 81 MG tablet Take 1 tablet (81 mg total) by mouth daily. Swallow whole.   cefdinir 300 MG capsule Commonly known as: OMNICEF Take 300 mg (1 capsule) on Monday after dialysis then again take 300 mg (1 capsule) on Wednesday afternoon   feeding supplement (NEPRO CARB STEADY) Liqd Take 237 mLs by mouth 2 (two) times daily between meals.   ferrous sulfate 325 (65 FE) MG EC tablet Take 1 tablet (325  mg total) by mouth every other day.   fluticasone 50 MCG/ACT nasal spray Commonly known as: FLONASE Place 1 spray into both nostrils daily.   metoprolol succinate 25 MG 24 hr tablet Commonly known as: TOPROL-XL TAKE 1/2 TABLET BY MOUTH EVERY DAY   midodrine 5 MG tablet Commonly known as: PROAMATINE TAKE 1 TABLET BY MOUTH 3 TIMES DAILY WITH MEALS What changed: See the new instructions.   mirtazapine 30 MG tablet Commonly known as: REMERON TAKE 1 TABLET BY MOUTH AT BEDTIME   pantoprazole 40 MG tablet Commonly known as: PROTONIX TAKE 1 TABLET BY MOUTH 2 TIMES DAILY BEFORE MEALS What changed: See the new instructions.   tamsulosin 0.4 MG Caps  capsule Commonly known as: FLOMAX TAKE 1 CAPSULE BY MOUTH ONCE DAILY   zolpidem 5 MG tablet Commonly known as: AMBIEN Take 1 tablet (5 mg total) by mouth at bedtime as needed. for sleep        Follow-up Information     [name], MD. Schedule an appointment as soon as possible for a visit in 1 week(s).   Specialties: Hematology, Oncology Contact information: [address] [place] [address] [phone]                 Discharge Instructions     Discharge instructions   Complete by: As directed    **IMPORTANT DISCHARGE INSTRUCTIONS**   From Dr. Maryfrances Bunnell: You were admitted for fever and weakness  Here, we checked for pneumonia, urinary infection and blood stream infection  Your urine tests were normal Your blood cultures grew nothing  You got better with antibiotics, and I believe your chest x-ray may have had some infection, so I recommend you finish seven days of antibiotics with cefdinir  (Because of your kidney function, this is dosed every other day, so take a dose tomorrow AFTER dialysis and then again Wednesday afternoon)  Start the iron supplement ferrous sulfate  Return to the hospital for any rectal bleeding.  Have your blood level checked in 1 week  Go see Dr. Mosetta Putt in 1 week   Increase activity slowly   Complete by: As directed    No wound care   Complete by: As directed        Discharge Exam: Filed Weights   10/24/23 1900 10/25/23 0456  Weight: 61.3 kg 62.8 kg    General: Pt is alert, awake, not in acute distress, sitting up in bed, conversational, weak Cardiovascular: RRR, nl S1-S2, no murmurs appreciated.   No LE edema.   Respiratory: Normal respiratory rate and rhythm.  CTAB without rales or wheezes. Abdominal: Abdomen soft and non-tender.  No distension or HSM.   Neuro/Psych: Strength symmetric in upper and lower extremities.  Judgment and insight appear normal.   Condition at discharge: stable  The results of  significant diagnostics from this hospitalization (including imaging, microbiology, ancillary and laboratory) are listed below for reference.   Imaging Studies: DG Chest Portable 1 View  Result Date: 10/24/2023 CLINICAL DATA:  Sepsis.  Fever.  Dialysis patient. EXAM: PORTABLE CHEST 1 VIEW COMPARISON:  Radiographs 12/19/2022 and 01/14/2022. FINDINGS: 1452 hours. Right IJ hemodialysis catheter tip extends to the superior cavoatrial junction. The heart size and mediastinal contours are stable. There is vascular congestion without definite edema, focal airspace disease, significant pleural effusion or pneumothorax. The bones appear unchanged, without acute findings. Telemetry leads overlie the chest. IMPRESSION: Vascular congestion without definite edema or focal airspace disease. Electronically Signed   By: Carey Bullocks  M.D.   On: 10/24/2023 17:39    Microbiology: Results for orders placed or performed during the hospital encounter of 10/24/23  Culture, blood (Routine x 2)     Status: None (Preliminary result)   Collection Time: 10/24/23  2:19 PM   Specimen: BLOOD  Result Value Ref Range Status   Specimen Description BLOOD SITE NOT SPECIFIED  Final   Special Requests   Final    BOTTLES DRAWN AEROBIC AND ANAEROBIC Blood Culture adequate volume   Culture   Final    NO GROWTH 2 DAYS Performed at [facility], [address], [place], [place] [address]    Report Status PENDING  Incomplete  Culture, blood (Routine x 2)     Status: None (Preliminary result)   Collection Time: 10/24/23  2:32 PM   Specimen: BLOOD RIGHT ARM  Result Value Ref Range Status   Specimen Description BLOOD RIGHT ARM  Final   Special Requests   Final    BOTTLES DRAWN AEROBIC AND ANAEROBIC Blood Culture results may not be optimal due to an excessive volume of blood received in culture bottles   Culture   Final    NO GROWTH 2 DAYS Performed at [facility], [address], [place], [place] [address]     Report Status PENDING  Incomplete  Resp panel by RT-PCR (RSV, Flu A&B, Covid) Peripheral     Status: None   Collection Time: 10/24/23  2:41 PM   Specimen: Peripheral; Nasal Swab  Result Value Ref Range Status   SARS Coronavirus 2 by RT PCR NEGATIVE NEGATIVE Final   Influenza A by PCR NEGATIVE NEGATIVE Final   Influenza B by PCR NEGATIVE NEGATIVE Final    Comment: (NOTE) The Xpert Xpress SARS-CoV-2/FLU/RSV plus assay is intended as an aid in the diagnosis of influenza from Nasopharyngeal swab specimens and should not be used as a sole basis for treatment. Nasal washings and aspirates are unacceptable for Xpert Xpress SARS-CoV-2/FLU/RSV testing.  Fact Sheet for Patients: BloggerCourse.com  Fact Sheet for Healthcare Providers: SeriousBroker.it  This test is not yet approved or cleared by the [place] FDA and has been authorized for detection and/or diagnosis of SARS-CoV-2 by FDA under an Emergency Use Authorization (EUA). This EUA will remain in effect (meaning this test can be used) for the duration of the COVID-19 declaration under Section 564(b)(1) of the Act, 21 U.S.C. section 360bbb-3(b)(1), unless the authorization is terminated or revoked.     Resp Syncytial Virus by PCR NEGATIVE NEGATIVE Final    Comment: (NOTE) Fact Sheet for Patients: BloggerCourse.com  Fact Sheet for Healthcare Providers: SeriousBroker.it  This test is not yet approved or cleared by the [place] FDA and has been authorized for detection and/or diagnosis of SARS-CoV-2 by FDA under an Emergency Use Authorization (EUA). This EUA will remain in effect (meaning this test can be used) for the duration of the COVID-19 declaration under Section 564(b)(1) of the Act, 21 U.S.C. section 360bbb-3(b)(1), unless the authorization is terminated or revoked.  Performed at [facility], [address] [place], [place] [address]   Respiratory (~20 pathogens) panel by PCR     Status: None   Collection Time: 10/25/23  8:09 AM   Specimen: Nasopharyngeal Swab; Respiratory  Result Value Ref Range Status   Adenovirus NOT DETECTED NOT DETECTED Final   Coronavirus 229E NOT DETECTED NOT DETECTED Final    Comment: (NOTE) The Coronavirus on the Respiratory Panel, DOES NOT test for the  novel  Coronavirus (2019 nCoV)    Coronavirus HKU1 NOT DETECTED NOT DETECTED Final   Coronavirus NL63 NOT DETECTED NOT DETECTED Final   Coronavirus OC43 NOT DETECTED NOT DETECTED Final   Metapneumovirus NOT DETECTED NOT DETECTED Final   Rhinovirus / Enterovirus NOT DETECTED NOT DETECTED Final   Influenza A NOT DETECTED NOT DETECTED Final   Influenza B NOT DETECTED NOT DETECTED Final   Parainfluenza Virus 1 NOT DETECTED NOT DETECTED Final   Parainfluenza Virus 2 NOT DETECTED NOT DETECTED Final   Parainfluenza Virus 3 NOT DETECTED NOT DETECTED Final   Parainfluenza Virus 4 NOT DETECTED NOT DETECTED Final   Respiratory Syncytial Virus NOT DETECTED NOT DETECTED Final   Bordetella pertussis NOT DETECTED NOT DETECTED Final   Bordetella Parapertussis NOT DETECTED NOT DETECTED Final   Chlamydophila pneumoniae NOT DETECTED NOT DETECTED Final   Mycoplasma pneumoniae NOT DETECTED NOT DETECTED Final    Comment: Performed at [facility], [address], [place], [place] [address]    Labs: CBC: Recent Labs  Lab 10/24/23 1419 10/25/23 0547 10/26/23 0458  WBC 20.2* 11.4* 9.1  NEUTROABS 19.0*  --   --   HGB 7.9* 7.9* 7.4*  HCT 24.7* 23.8* 22.6*  MCV 94.6 93.0 93.0  PLT 145* 129* 131*   Basic Metabolic Panel: Recent Labs  Lab 10/24/23 1419 10/24/23 2137 10/25/23 0547 10/26/23 0458  NA 133*  --  133* 134*  K 3.6  --  3.8 4.4  CL 97*  --  98 97*  CO2 22  --  23 22  GLUCOSE 126*  --  103* 109*  BUN 21  --  32* 53*  CREATININE 3.51*  --  4.50* 5.45*  CALCIUM 7.9*  --  7.7* 7.9*  MG  --  1.6*  --    --   PHOS  --   --  4.7*  --    Liver Function Tests: Recent Labs  Lab 10/24/23 1419 10/25/23 0547 10/26/23 0458  AST 41  --  37  ALT 16  --  13  ALKPHOS 72  --  58  BILITOT 0.9  --  0.6  PROT 5.3*  --  4.7*  ALBUMIN 2.8* 2.4* 2.2*   CBG: No results for input(s): "GLUCAP" in the last 168 hours.  Discharge time spent: approximately 35 minutes spent on discharge counseling, evaluation of patient on day of discharge, and coordination of discharge planning with nursing, social work, pharmacy and case management  Signed: Alberteen Sam, MD [facility] 10/26/2023

## 2023-10-26 NOTE — Plan of Care (Signed)

## 2023-10-26 NOTE — TOC Transition Note (Signed)
Transition of Care [facility]) - CM/SW Discharge Note   Patient Details  Name: Caleb Simmons MRN: [id number] Date of Birth: Feb 18, 1951  Transition of Care [facility]) CM/SW Contact:  Lawerance Sabal, RN Phone Number: 10/26/2023, 12:18 PM   Clinical Narrative:     Sherron Monday w patient and visitor at bedside, patient politely refused HH services. No other needs for DC identified at this time   Final next level of care: Home/Self Care Barriers to Discharge: No Barriers Identified   Patient Goals and CMS Choice      Discharge Placement                         Discharge Plan and Services Additional resources added to the After Visit Summary for                  DME Arranged: N/A         HH Arranged: Patient Refused HH          Social Determinants of Health (SDOH) Interventions SDOH Screenings   Food Insecurity: No Food Insecurity (10/24/2023)  Housing: Low Risk  (10/24/2023)  Transportation Needs: No Transportation Needs (10/24/2023)  Utilities: Not At Risk (10/24/2023)  Financial Resource Strain: Low Risk  (09/11/2021)  Tobacco Use: High Risk (10/24/2023)     Readmission Risk Interventions     No data to display

## 2023-10-27 ENCOUNTER — Emergency Department (HOSPITAL_COMMUNITY): Payer: Medicare HMO

## 2023-10-27 ENCOUNTER — Encounter (HOSPITAL_COMMUNITY): Payer: Self-pay | Admitting: Emergency Medicine

## 2023-10-27 ENCOUNTER — Inpatient Hospital Stay (HOSPITAL_COMMUNITY)
Admission: EM | Admit: 2023-10-27 | Discharge: 2023-10-31 | DRG: 314 | Discharge status: Against Medical Advice | Payer: Medicare HMO | Attending: Internal Medicine | Admitting: Internal Medicine

## 2023-10-27 ENCOUNTER — Other Ambulatory Visit: Payer: Self-pay

## 2023-10-27 DIAGNOSIS — Z681 Body mass index (BMI) 19 or less, adult: Secondary | ICD-10-CM | POA: Diagnosis not present

## 2023-10-27 DIAGNOSIS — Z1152 Encounter for screening for COVID-19: Secondary | ICD-10-CM | POA: Diagnosis not present

## 2023-10-27 DIAGNOSIS — I739 Peripheral vascular disease, unspecified: Secondary | ICD-10-CM | POA: Diagnosis present

## 2023-10-27 DIAGNOSIS — R7881 Bacteremia: Principal | ICD-10-CM | POA: Diagnosis present

## 2023-10-27 DIAGNOSIS — D649 Anemia, unspecified: Secondary | ICD-10-CM | POA: Diagnosis not present

## 2023-10-27 DIAGNOSIS — T80211A Bloodstream infection due to central venous catheter, initial encounter: Secondary | ICD-10-CM | POA: Diagnosis present

## 2023-10-27 DIAGNOSIS — F419 Anxiety disorder, unspecified: Secondary | ICD-10-CM | POA: Diagnosis present

## 2023-10-27 DIAGNOSIS — T827XXA Infection and inflammatory reaction due to other cardiac and vascular devices, implants and grafts, initial encounter: Secondary | ICD-10-CM | POA: Diagnosis not present

## 2023-10-27 DIAGNOSIS — B9561 Methicillin susceptible Staphylococcus aureus infection as the cause of diseases classified elsewhere: Secondary | ICD-10-CM | POA: Diagnosis present

## 2023-10-27 DIAGNOSIS — I132 Hypertensive heart and chronic kidney disease with heart failure and with stage 5 chronic kidney disease, or end stage renal disease: Secondary | ICD-10-CM | POA: Diagnosis present

## 2023-10-27 DIAGNOSIS — F17210 Nicotine dependence, cigarettes, uncomplicated: Secondary | ICD-10-CM | POA: Diagnosis present

## 2023-10-27 DIAGNOSIS — K219 Gastro-esophageal reflux disease without esophagitis: Secondary | ICD-10-CM | POA: Diagnosis present

## 2023-10-27 DIAGNOSIS — E8581 Light chain (AL) amyloidosis: Secondary | ICD-10-CM | POA: Diagnosis present

## 2023-10-27 DIAGNOSIS — N400 Enlarged prostate without lower urinary tract symptoms: Secondary | ICD-10-CM | POA: Diagnosis present

## 2023-10-27 DIAGNOSIS — Z9221 Personal history of antineoplastic chemotherapy: Secondary | ICD-10-CM

## 2023-10-27 DIAGNOSIS — Y848 Other medical procedures as the cause of abnormal reaction of the patient, or of later complication, without mention of misadventure at the time of the procedure: Secondary | ICD-10-CM | POA: Diagnosis present

## 2023-10-27 DIAGNOSIS — I340 Nonrheumatic mitral (valve) insufficiency: Secondary | ICD-10-CM | POA: Diagnosis not present

## 2023-10-27 DIAGNOSIS — R636 Underweight: Secondary | ICD-10-CM | POA: Diagnosis present

## 2023-10-27 DIAGNOSIS — N2581 Secondary hyperparathyroidism of renal origin: Secondary | ICD-10-CM | POA: Diagnosis present

## 2023-10-27 DIAGNOSIS — Z7982 Long term (current) use of aspirin: Secondary | ICD-10-CM | POA: Diagnosis not present

## 2023-10-27 DIAGNOSIS — N186 End stage renal disease: Secondary | ICD-10-CM | POA: Diagnosis present

## 2023-10-27 DIAGNOSIS — G894 Chronic pain syndrome: Secondary | ICD-10-CM | POA: Diagnosis present

## 2023-10-27 DIAGNOSIS — Z992 Dependence on renal dialysis: Secondary | ICD-10-CM | POA: Diagnosis not present

## 2023-10-27 DIAGNOSIS — Z79899 Other long term (current) drug therapy: Secondary | ICD-10-CM | POA: Diagnosis not present

## 2023-10-27 DIAGNOSIS — D631 Anemia in chronic kidney disease: Secondary | ICD-10-CM | POA: Diagnosis present

## 2023-10-27 DIAGNOSIS — I5032 Chronic diastolic (congestive) heart failure: Secondary | ICD-10-CM | POA: Diagnosis present

## 2023-10-27 LAB — COMPREHENSIVE METABOLIC PANEL
ALT: 13 U/L (ref 0–44)
AST: 33 U/L (ref 15–41)
Albumin: 2.6 g/dL — ABNORMAL LOW (ref 3.5–5.0)
Alkaline Phosphatase: 69 U/L (ref 38–126)
Anion gap: 10 (ref 5–15)
BUN: 25 mg/dL — ABNORMAL HIGH (ref 8–23)
CO2: 25 mmol/L (ref 22–32)
Calcium: 7.8 mg/dL — ABNORMAL LOW (ref 8.9–10.3)
Chloride: 99 mmol/L (ref 98–111)
Creatinine, Ser: 3.35 mg/dL — ABNORMAL HIGH (ref 0.61–1.24)
GFR, Estimated: 19 mL/min — ABNORMAL LOW (ref >60)
Glucose, Bld: 117 mg/dL — ABNORMAL HIGH (ref 70–99)
Potassium: 3.3 mmol/L — ABNORMAL LOW (ref 3.5–5.1)
Sodium: 134 mmol/L — ABNORMAL LOW (ref 135–145)
Total Bilirubin: 0.5 mg/dL (ref <1.2)
Total Protein: 5.2 g/dL — ABNORMAL LOW (ref 6.5–8.1)

## 2023-10-27 LAB — CBC WITH DIFFERENTIAL/PLATELET
Abs Immature Granulocytes: 0.06 10*3/uL (ref 0.00–0.07)
Basophils Absolute: 0 10*3/uL (ref 0.0–0.1)
Basophils Relative: 1 %
Eosinophils Absolute: 0.1 10*3/uL (ref 0.0–0.5)
Eosinophils Relative: 1 %
HCT: 25.3 % — ABNORMAL LOW (ref 39.0–52.0)
Hemoglobin: 8 g/dL — ABNORMAL LOW (ref 13.0–17.0)
Immature Granulocytes: 1 %
Lymphocytes Relative: 14 %
Lymphs Abs: 1.1 10*3/uL (ref 0.7–4.0)
MCH: 30.5 pg (ref 26.0–34.0)
MCHC: 31.6 g/dL (ref 30.0–36.0)
MCV: 96.6 fL (ref 80.0–100.0)
Monocytes Absolute: 0.6 10*3/uL (ref 0.1–1.0)
Monocytes Relative: 7 %
Neutro Abs: 5.7 10*3/uL (ref 1.7–7.7)
Neutrophils Relative %: 76 %
Platelets: 150 10*3/uL (ref 150–400)
RBC: 2.62 MIL/uL — ABNORMAL LOW (ref 4.22–5.81)
RDW: 14.9 % (ref 11.5–15.5)
WBC: 7.5 10*3/uL (ref 4.0–10.5)
nRBC: 0 % (ref 0.0–0.2)

## 2023-10-27 LAB — LACTIC ACID, PLASMA
Lactic Acid, Venous: 1.2 mmol/L (ref 0.5–1.9)
Lactic Acid, Venous: 1.2 mmol/L (ref 0.5–1.9)

## 2023-10-27 MED ORDER — VANCOMYCIN VARIABLE DOSE PER UNSTABLE RENAL FUNCTION (PHARMACIST DOSING): Status: DC

## 2023-10-27 MED ORDER — SENNOSIDES-DOCUSATE SODIUM 8.6-50 MG PO TABS
1.0000 | ORAL_TABLET | Freq: Every day | ORAL | Status: DC
  Filled 2023-10-27 (×3): qty 1

## 2023-10-27 MED ORDER — ACETAMINOPHEN 500 MG PO TABS
1000.0000 mg | ORAL_TABLET | Freq: Four times a day (QID) | ORAL | Status: DC | PRN
  Administered 2023-10-28 – 2023-10-29 (×4): 1000 mg via ORAL
  Filled 2023-10-27 (×4): qty 2

## 2023-10-27 MED ORDER — PANTOPRAZOLE SODIUM 40 MG PO TBEC
40.0000 mg | DELAYED_RELEASE_TABLET | Freq: Two times a day (BID) | ORAL | Status: DC
  Administered 2023-10-28 – 2023-10-31 (×7): 40 mg via ORAL
  Filled 2023-10-27 (×7): qty 1

## 2023-10-27 MED ORDER — ALPRAZOLAM 0.5 MG PO TABS
1.0000 mg | ORAL_TABLET | Freq: Three times a day (TID) | ORAL | Status: DC | PRN
  Administered 2023-10-28 – 2023-10-30 (×6): 1 mg via ORAL
  Filled 2023-10-27 (×7): qty 2

## 2023-10-27 MED ORDER — CEFDINIR 300 MG PO CAPS
300.0000 mg | ORAL_CAPSULE | Freq: Once | ORAL | Status: DC
Start: 2023-10-29 — End: 2023-10-28

## 2023-10-27 MED ORDER — MIRTAZAPINE 15 MG PO TABS
30.0000 mg | ORAL_TABLET | Freq: Every day | ORAL | Status: DC
  Administered 2023-10-27 – 2023-10-31 (×5): 30 mg via ORAL
  Filled 2023-10-27 (×6): qty 2

## 2023-10-27 MED ORDER — MIDODRINE HCL 5 MG PO TABS
5.0000 mg | ORAL_TABLET | Freq: Three times a day (TID) | ORAL | Status: DC
  Administered 2023-10-28 – 2023-10-29 (×4): 5 mg via ORAL
  Filled 2023-10-27 (×5): qty 1

## 2023-10-27 MED ORDER — TAMSULOSIN HCL 0.4 MG PO CAPS
0.4000 mg | ORAL_CAPSULE | Freq: Every day | ORAL | Status: DC
  Administered 2023-10-28 – 2023-10-31 (×3): 0.4 mg via ORAL
  Filled 2023-10-27 (×3): qty 1

## 2023-10-27 MED ORDER — VANCOMYCIN HCL 1500 MG/300ML IV SOLN
1500.0000 mg | Freq: Once | INTRAVENOUS | Status: AC
  Administered 2023-10-27: 1500 mg via INTRAVENOUS
  Filled 2023-10-27: qty 300

## 2023-10-27 MED ORDER — ASPIRIN 81 MG PO TBEC
81.0000 mg | DELAYED_RELEASE_TABLET | Freq: Every day | ORAL | Status: DC
  Administered 2023-10-28 – 2023-10-31 (×3): 81 mg via ORAL
  Filled 2023-10-27 (×3): qty 1

## 2023-10-27 NOTE — ED Provider Notes (Signed)
[place] EMERGENCY DEPARTMENT AT [facility] Provider Note   CSN: [id number] Arrival date & time: 10/27/23  1552     History  Chief Complaint  Patient presents with   Abnormal Lab    Caleb Simmons is a 72 y.o. male.   Abnormal Lab Patient sent in for positive blood cultures.  Reportedly was recently admitted to hospital for fever of unknown origin but thought to be secondary to pneumonia.  Was at dialysis today and they had drawn blood cultures on Friday that reportedly came back now with staph.  Told to come in in the documentation they got said would need catheter holiday and exchange.  Patient states he is still feeling bad.  No fevers.  He is Monday Wednesday Friday dialysis and was dialyzed today.    Past Medical History:  Diagnosis Date   Cancer [facility])     Home Medications Prior to Admission medications   Medication Sig Start Date End Date Taking? Authorizing Provider  acetaminophen (TYLENOL) 500 MG tablet Take 1,000 mg by mouth every 6 (six) hours as needed for moderate pain.    [provider]  ALPRAZolam (XANAX) 1 MG tablet TAKE 1 TABLET BY MOUTH EVERY 8 HOURS AS NEEDED FOR ANXIETY OR SLEEP Patient taking differently: Take 1 mg by mouth every 8 (eight) hours as needed for sleep. 10/14/23   Pickenpack-Cousar, Arty Baumgartner, NP  aspirin 81 MG EC tablet Take 1 tablet (81 mg total) by mouth daily. Swallow whole. 01/20/22   Almon Hercules, MD  cefdinir (OMNICEF) 300 MG capsule Take 300 mg (1 capsule) on Monday after dialysis then again take 300 mg (1 capsule) on Wednesday afternoon 10/26/23   Danford, Earl Lites, MD  ferrous sulfate 325 (65 FE) MG EC tablet Take 1 tablet (325 mg total) by mouth every other day. 10/26/23   Danford, Earl Lites, MD  fluticasone (FLONASE) 50 MCG/ACT nasal spray Place 1 spray into both nostrils daily.    [provider]  metoprolol succinate (TOPROL-XL) 25 MG 24 hr tablet TAKE 1/2 TABLET BY MOUTH EVERY DAY 08/21/23    Laurey Morale, MD  midodrine (PROAMATINE) 5 MG tablet TAKE 1 TABLET BY MOUTH 3 TIMES DAILY WITH MEALS Patient taking differently: Take 5 mg by mouth 3 (three) times daily with meals. 08/11/23   Laurey Morale, MD  mirtazapine (REMERON) 30 MG tablet TAKE 1 TABLET BY MOUTH AT BEDTIME 10/15/23   Pickenpack-Cousar, Arty Baumgartner, NP  Nutritional Supplements (FEEDING SUPPLEMENT, NEPRO CARB STEADY,) LIQD Take 237 mLs by mouth 2 (two) times daily between meals. 10/26/23   Danford, Earl Lites, MD  pantoprazole (PROTONIX) 40 MG tablet TAKE 1 TABLET BY MOUTH 2 TIMES DAILY BEFORE MEALS Patient taking differently: Take 40 mg by mouth 2 (two) times daily before a meal. 10/15/23   [name], MD  tamsulosin (FLOMAX) 0.4 MG CAPS capsule TAKE 1 CAPSULE BY MOUTH ONCE DAILY 10/15/23   [name], MD  zolpidem (AMBIEN) 5 MG tablet Take 1 tablet (5 mg total) by mouth at bedtime as needed. for sleep 10/08/23   Pickenpack-Cousar, Arty Baumgartner, NP      Allergies    Iron sucrose    Review of Systems   Review of Systems  Physical Exam Updated Vital Signs BP (!) 155/81 (BP Location: Right Arm)   Pulse 66   Temp 98.1 F (36.7 C)   Resp 16   SpO2 100%  Physical Exam Vitals reviewed.  Cardiovascular:  Rate and Rhythm: Regular rhythm.  Pulmonary:     Breath sounds: No wheezing or rhonchi.     Comments: Dialysis catheter right chest wall.  No surrounding erythema. Neurological:     Mental Status: He is alert and oriented to person, place, and time.     ED Results / Procedures / Treatments   Labs (all labs ordered are listed, but only abnormal results are displayed) Labs Reviewed  CBC WITH DIFFERENTIAL/PLATELET - Abnormal; Notable for the following components:      Result Value   RBC 2.62 (*)    Hemoglobin 8.0 (*)    HCT 25.3 (*)    All other components within normal limits  COMPREHENSIVE METABOLIC PANEL - Abnormal; Notable for the following components:   Sodium 134 (*)    Potassium 3.3 (*)     Glucose, Bld 117 (*)    BUN 25 (*)    Creatinine, Ser 3.35 (*)    Calcium 7.8 (*)    Total Protein 5.2 (*)    Albumin 2.6 (*)    GFR, Estimated 19 (*)    All other components within normal limits  CULTURE, BLOOD (ROUTINE X 2)  CULTURE, BLOOD (ROUTINE X 2)  LACTIC ACID, PLASMA  LACTIC ACID, PLASMA    EKG None  Radiology DG Chest 2 View  Result Date: 10/27/2023 CLINICAL DATA:  Weakness and cough. EXAM: CHEST - 2 VIEW COMPARISON:  Chest radiograph dated 10/24/2023. FINDINGS: Dialysis catheter in similar position. No focal consolidation, pleural effusion, or pneumothorax. The cardiac silhouette is within normal limits. No acute osseous pathology. IMPRESSION: No active cardiopulmonary disease. Electronically Signed   By: Elgie Collard M.D.   On: 10/27/2023 19:37    Procedures Procedures    Medications Ordered in ED Medications - No data to display  ED Course/ Medical Decision Making/ A&P                                 Medical Decision Making  Sent in for bacteremia.  From catheter.  Still feeling bad but not septic at this time.  Blood work repeated.  Is cussed with nephrology, Dr. Glenna Fellows.  Will require admission to the hospital IV vancomycin and catheter holiday.  Is dialyzed next on Friday.   Dr Glenna Fellows said "I don't see where he was dosed with staph coverage at dialysis today and his next dialysis is not until Friday. I would give dose vanc and admit him to figure out what his antibiotic regimen will be since he's only twice a week HD. Also catheter holiday - that's nothing you need to figure out tonight "  Will discuss with hospitalist for admission.        Final Clinical Impression(s) / ED Diagnoses Final diagnoses:  Bacteremia  End stage renal disease on dialysis [facility])    Rx / DC Orders ED Discharge Orders     None         Benjiman Core, MD 10/27/23 2109

## 2023-10-27 NOTE — ED Triage Notes (Signed)
Pt reports he was d/c from the hospital this morning. When he returned home he was told to come back to the hospital due to his dialysis catheter "being infected with staph."

## 2023-10-27 NOTE — ED Provider Triage Note (Signed)
Emergency Medicine Provider Triage Evaluation Note  Caleb Simmons , a 72 y.o. male  was evaluated in triage.  Pt complains of infection.  Review of Systems  Positive: fatigue Negative: Chest pain, shortness of breath  Physical Exam  BP (!) 151/91   Pulse (!) 52   Temp 98.6 F (37 C) (Oral)   Resp 16   SpO2 98%  Gen:   Awake, no distress   Resp:  Normal effort  MSK:   Moves extremities without difficulty  Other:    Medical Decision Making  Medically screening exam initiated at 4:32 PM.  Appropriate orders placed.  Caleb Simmons was informed that the remainder of the evaluation will be completed by another provider, this initial triage assessment does not replace that evaluation, and the importance of remaining in the ED until their evaluation is complete.  Patient comes in after blood cultures drawn with nephrology.  Nephrology drew blood cultures on Friday, came back positive today showing staph.  Nephrology recommended coming to emergency room for catheter replacement.  The patient has had increased fatigue and weakness over the past 2 or 3 days but otherwise not reporting any symptoms at this time.   Caleb Simmons, [place] 10/27/23 0981

## 2023-10-27 NOTE — H&P (Signed)
History and Physical    Patient: Caleb Simmons [id number]:[id number] DOB: [birth year] DOA: 10/27/2023 DOS: the patient was seen and examined on 10/27/2023 PCP: [name], MD  Patient coming from: Home  Chief Complaint:  Chief Complaint  Patient presents with   Abnormal Lab   HPI: Caleb Simmons is a 72 y.o. 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  He went to dialysis this morning and was told that his blood cultures were positive for staph and then he had to report back to the hospital.  He does have an external dialysis catheter in his chest which is likely the source.  The patient reports that he does not feel well overall but is no longer having fevers or chills.  He is very hungry and is upset that he has not been able to eat.  In the emergency department IV vancomycin was started and the patient be will be admitted to the hospitalist service.  Review of Systems: As mentioned in the history of present illness. All other systems reviewed and are negative. Past Medical History:  Diagnosis Date   Cancer [facility])    Past Surgical History:  Procedure Laterality Date   APPENDECTOMY     IR ANGIO INTRA EXTRACRAN SEL COM CAROTID INNOMINATE BILAT MOD SED  01/16/2022   IR ANGIO INTRA EXTRACRAN SEL COM CAROTID INNOMINATE BILAT MOD SED  07/16/2022   IR ANGIO VERTEBRAL SEL SUBCLAVIAN INNOMINATE BILAT MOD SED  01/16/2022   IR ANGIO VERTEBRAL SEL SUBCLAVIAN INNOMINATE UNI R MOD SED  07/16/2022   IR ANGIO VERTEBRAL SEL VERTEBRAL UNI L MOD SED  07/16/2022   IR CT HEAD LTD  01/18/2022   IR EMBO ART  VEN HEMORR LYMPH EXTRAV  INC GUIDE ROADMAPPING  08/30/2021   IR FLUORO GUIDE CV LINE RIGHT  08/30/2021   IR FLUORO GUIDE CV LINE RIGHT  09/18/2021   IR FLUORO GUIDE CV LINE RIGHT  05/02/2022   IR PERCUTANEOUS ART THROMBECTOMY/INFUSION INTRACRANIAL INC DIAG ANGIO   01/18/2022   IR RADIOLOGIST EVAL & MGMT  08/02/2022   IR RENAL SELECTIVE  UNI INC S&I MOD SED  08/31/2021   IR US GUIDE VASC ACCESS RIGHT  08/30/2021   IR US GUIDE VASC ACCESS RIGHT  08/30/2021   IR US GUIDE VASC ACCESS RIGHT  09/18/2021   IR US GUIDE VASC ACCESS RIGHT  01/16/2022   IR US GUIDE VASC ACCESS RIGHT  07/16/2022   IR VENOCAVAGRAM SVC  05/02/2022   RADIOLOGY WITH ANESTHESIA N/A 01/18/2022   Procedure: Cerebral angioplasty with possible stenting;  Surgeon: Julieanne Cotton, MD;  Location: [facility] OR;  Service: Radiology;  Laterality: N/A;   Social History:  reports that he has been smoking cigarettes. He has a 6.3 pack-year smoking history. He has never used smokeless tobacco. No history on file for alcohol use and drug use.  Allergies  Allergen Reactions   Iron Sucrose Nausea And Vomiting    History reviewed. No pertinent family history.  Prior to Admission medications   Medication Sig Start Date End Date Taking? Authorizing Provider  acetaminophen (TYLENOL) 500 MG tablet Take 1,000 mg by mouth every 6 (six) hours as needed for moderate pain.   Yes [provider]  ALPRAZolam (XANAX) 1 MG tablet TAKE 1 TABLET BY MOUTH EVERY 8 HOURS AS NEEDED FOR ANXIETY OR SLEEP Patient taking differently: Take 1 mg  by mouth every 8 (eight) hours as needed for sleep. 10/14/23  Yes Pickenpack-Cousar, Arty Baumgartner, NP  aspirin 81 MG EC tablet Take 1 tablet (81 mg total) by mouth daily. Swallow whole. 01/20/22  Yes Almon Hercules, MD  cefdinir (OMNICEF) 300 MG capsule Take 300 mg (1 capsule) on Monday after dialysis then again take 300 mg (1 capsule) on Wednesday afternoon Patient taking differently: Take 300 mg by mouth See admin instructions. Take 300 mg (1 capsule) on Monday after dialysis then again take 300 mg (1 capsule) on Wednesday afternoon 10/26/23  Yes Danford, Earl Lites, MD  fluticasone (FLONASE) 50 MCG/ACT nasal spray Place 1 spray into both nostrils daily.   Yes [provider]   metoprolol succinate (TOPROL-XL) 25 MG 24 hr tablet TAKE 1/2 TABLET BY MOUTH EVERY DAY 08/21/23  Yes Laurey Morale, MD  midodrine (PROAMATINE) 5 MG tablet TAKE 1 TABLET BY MOUTH 3 TIMES DAILY WITH MEALS Patient taking differently: Take 5 mg by mouth 3 (three) times daily with meals. 08/11/23  Yes Laurey Morale, MD  mirtazapine (REMERON) 30 MG tablet TAKE 1 TABLET BY MOUTH AT BEDTIME 10/15/23  Yes Pickenpack-Cousar, Athena N, NP  pantoprazole (PROTONIX) 40 MG tablet TAKE 1 TABLET BY MOUTH 2 TIMES DAILY BEFORE MEALS Patient taking differently: Take 40 mg by mouth 2 (two) times daily before a meal. 10/15/23  Yes [name], MD  SENNA PLUS 8.6-50 MG tablet Take 1 tablet by mouth daily. 07/14/23  Yes [provider]  tamsulosin (FLOMAX) 0.4 MG CAPS capsule TAKE 1 CAPSULE BY MOUTH ONCE DAILY 10/15/23  Yes [name], MD  zolpidem (AMBIEN) 5 MG tablet Take 1 tablet (5 mg total) by mouth at bedtime as needed. for sleep 10/08/23  Yes Pickenpack-Cousar, Arty Baumgartner, NP  ferrous sulfate 325 (65 FE) MG EC tablet Take 1 tablet (325 mg total) by mouth every other day. 10/26/23   Danford, Earl Lites, MD  Nutritional Supplements (FEEDING SUPPLEMENT, NEPRO CARB STEADY,) LIQD Take 237 mLs by mouth 2 (two) times daily between meals. Patient not taking: Reported on 10/27/2023 10/26/23   Alberteen Sam, MD    Physical Exam: Vitals:   10/27/23 2050 10/27/23 2055 10/27/23 2140 10/27/23 2145  BP: (!) 167/81 (!) 166/96 (!) 168/103 (!) 157/82  Pulse:  65 61 (!) 57  Resp: 12 15 16 18   Temp:      TempSrc:      SpO2:  100% 99% 99%   Physical Exam:  General: No acute distress, well developed, malnourished HEENT: Normocephalic, atraumatic, PERRL Cardiovascular: Normal rate and rhythm. Distal pulses intact. Pulmonary: Normal pulmonary effort, normal breath sounds Gastrointestinal: Nondistended abdomen, soft, non-tender, normoactive bowel sounds, no organomegaly Musculoskeletal:No lower ext  edema Lymphadenopathy: No cervical LAD Skin: Skin is warm and dry. Neuro: No focal deficits noted, AAOx3. PSYCH: Attentive and cooperative  Data Reviewed:  Results for orders placed or performed during the hospital encounter of 10/27/23 (from the past 24 hour(s))  CBC with Differential     Status: Abnormal   Collection Time: 10/27/23  5:17 PM  Result Value Ref Range   WBC 7.5 4.0 - 10.5 K/uL   RBC 2.62 (L) 4.22 - 5.81 MIL/uL   Hemoglobin 8.0 (L) 13.0 - 17.0 g/dL   HCT 16.1 (L) 09.6 - 04.5 %   MCV 96.6 80.0 - 100.0 fL   MCH 30.5 26.0 - 34.0 pg   MCHC 31.6 30.0 - 36.0 g/dL   RDW 40.9 81.1 - 91.4 %  Platelets 150 150 - 400 K/uL   nRBC 0.0 0.0 - 0.2 %   Neutrophils Relative % 76 %   Neutro Abs 5.7 1.7 - 7.7 K/uL   Lymphocytes Relative 14 %   Lymphs Abs 1.1 0.7 - 4.0 K/uL   Monocytes Relative 7 %   Monocytes Absolute 0.6 0.1 - 1.0 K/uL   Eosinophils Relative 1 %   Eosinophils Absolute 0.1 0.0 - 0.5 K/uL   Basophils Relative 1 %   Basophils Absolute 0.0 0.0 - 0.1 K/uL   Immature Granulocytes 1 %   Abs Immature Granulocytes 0.06 0.00 - 0.07 K/uL  Comprehensive metabolic panel     Status: Abnormal   Collection Time: 10/27/23  5:17 PM  Result Value Ref Range   Sodium 134 (L) 135 - 145 mmol/L   Potassium 3.3 (L) 3.5 - 5.1 mmol/L   Chloride 99 98 - 111 mmol/L   CO2 25 22 - 32 mmol/L   Glucose, Bld 117 (H) 70 - 99 mg/dL   BUN 25 (H) 8 - 23 mg/dL   Creatinine, Ser 4.09 (H) 0.61 - 1.24 mg/dL   Calcium 7.8 (L) 8.9 - 10.3 mg/dL   Total Protein 5.2 (L) 6.5 - 8.1 g/dL   Albumin 2.6 (L) 3.5 - 5.0 g/dL   AST 33 15 - 41 U/L   ALT 13 0 - 44 U/L   Alkaline Phosphatase 69 38 - 126 U/L   Total Bilirubin 0.5 <1.2 mg/dL   GFR, Estimated 19 (L) >60 mL/min   Anion gap 10 5 - 15  Lactic acid, plasma     Status: None   Collection Time: 10/27/23  5:17 PM  Result Value Ref Range   Lactic Acid, Venous 1.2 0.5 - 1.9 mmol/L  Lactic acid, plasma     Status: None   Collection Time: 10/27/23   8:54 PM  Result Value Ref Range   Lactic Acid, Venous 1.2 0.5 - 1.9 mmol/L     Assessment and Plan: Staph bacteremia ESRD  - IV Vanc - Await Nephrology consultation for recommendations about his catheter - peripheral IV placed -The patient is currently on midodrine and metoprolol.  Will hold those and monitor bp to see which one is needed.   Advance Care Planning:   Code Status: Prior he is full code and names his son as his surrogate decision-maker.  Consults: Nephrology  Family Communication: Significant other at bedside  Severity of Illness: The appropriate patient status for this patient is INPATIENT. Inpatient status is judged to be reasonable and necessary in order to provide the required intensity of service to ensure the patient's safety. The patient's presenting symptoms, physical exam findings, and initial radiographic and laboratory data in the context of their chronic comorbidities is felt to place them at high risk for further clinical deterioration. Furthermore, it is not anticipated that the patient will be medically stable for discharge from the hospital within 2 midnights of admission.   * I certify that at the point of admission it is my clinical judgment that the patient will require inpatient hospital care spanning beyond 2 midnights from the point of admission due to high intensity of service, high risk for further deterioration and high frequency of surveillance required.*  Author: Buena Irish, MD 10/27/2023 10:40 PM  For on call review [email or web address].

## 2023-10-27 NOTE — Progress Notes (Signed)
Pharmacy Antibiotic Note  Caleb Simmons is a 72 y.o. male admitted on 10/27/2023 presenting s/p recent discharge with reported outpt dialysis cath Cx positive for staph.  Pharmacy has been consulted for vancomycin dosing.  ESRD-HD usually twice a week, will likely require line holiday  Plan: Vancomycin 1500 mg IV x 1, then 750 mg IV qHD Monitor HD schedule and line holiday plans, Cx and ID recs to narrow Vancomycin random level as needed     Temp (24hrs), Avg:98.4 F (36.9 C), Min:98.1 F (36.7 C), Max:98.6 F (37 C)  Recent Labs  Lab 10/24/23 1419 10/24/23 1432 10/24/23 1835 10/25/23 0547 10/26/23 0458 10/27/23 1717  WBC 20.2*  --   --  11.4* 9.1 7.5  CREATININE 3.51*  --   --  4.50* 5.45* 3.35*  LATICACIDVEN  --  1.2 1.3  --   --  1.2    Estimated Creatinine Clearance: 17.4 mL/min (A) (by C-G formula based on SCr of 3.35 mg/dL (H)).    Allergies  Allergen Reactions   Iron Sucrose Nausea And Vomiting    [name], PharmD, [facility] Clinical Pharmacist ED Pharmacist Phone # [phone] 10/27/2023 9:23 PM

## 2023-10-28 ENCOUNTER — Inpatient Hospital Stay (HOSPITAL_COMMUNITY): Payer: Medicare HMO

## 2023-10-28 DIAGNOSIS — N186 End stage renal disease: Secondary | ICD-10-CM | POA: Diagnosis not present

## 2023-10-28 DIAGNOSIS — T827XXA Infection and inflammatory reaction due to other cardiac and vascular devices, implants and grafts, initial encounter: Secondary | ICD-10-CM | POA: Diagnosis not present

## 2023-10-28 DIAGNOSIS — R7881 Bacteremia: Secondary | ICD-10-CM | POA: Diagnosis not present

## 2023-10-28 HISTORY — PX: IR REMOVAL TUN CV CATH W/O FL: IMG2289

## 2023-10-28 MED ORDER — LIDOCAINE HCL 1 % IJ SOLN
INTRAMUSCULAR | Status: AC
Start: 2023-10-28 — End: ?
  Filled 2023-10-28: qty 20

## 2023-10-28 MED ORDER — CHLORHEXIDINE GLUCONATE CLOTH 2 % EX PADS
6.0000 | MEDICATED_PAD | Freq: Every day | CUTANEOUS | Status: DC
  Administered 2023-10-29 – 2023-10-31 (×2): 6 via TOPICAL

## 2023-10-28 MED ORDER — CEFAZOLIN SODIUM-DEXTROSE 1-4 GM/50ML-% IV SOLN
1.0000 g | INTRAVENOUS | Status: DC
Start: 2023-10-28 — End: 2023-11-01
  Administered 2023-10-28 – 2023-10-31 (×4): 1 g via INTRAVENOUS
  Filled 2023-10-28 (×4): qty 50

## 2023-10-28 MED ORDER — HYDROMORPHONE HCL 1 MG/ML PO LIQD: 0.5000 mg | Freq: Once | ORAL | Status: DC | PRN

## 2023-10-28 MED ORDER — LIDOCAINE HCL 1 % IJ SOLN
INTRAMUSCULAR | Status: AC
  Filled 2023-10-28: qty 20

## 2023-10-28 MED ORDER — HEPARIN SODIUM (PORCINE) 5000 UNIT/ML IJ SOLN
5000.0000 [IU] | Freq: Three times a day (TID) | INTRAMUSCULAR | Status: DC
  Administered 2023-10-28 – 2023-10-31 (×11): 5000 [IU] via SUBCUTANEOUS
  Filled 2023-10-28 (×11): qty 1

## 2023-10-28 MED ORDER — HYDROMORPHONE HCL 1 MG/ML PO LIQD: 1.0000 mg | Freq: Once | ORAL | Status: DC | PRN

## 2023-10-28 MED ORDER — HYDROMORPHONE HCL 1 MG/ML IJ SOLN
0.5000 mg | Freq: Once | INTRAMUSCULAR | Status: AC | PRN
  Administered 2023-10-28: 0.5 mg via INTRAVENOUS
  Filled 2023-10-28: qty 0.5

## 2023-10-28 NOTE — Procedures (Signed)
Successful removal of right IJ tunneled HD catheter.   After obtaining consent and performing a time-out, the right upper chest was prepped and draped in the normal sterile fashion. The heparin was removed from both ports. 1% lidocaine was used for local anesthesia. Using gentle blunt dissection and moderate manual traction the cuff of the catheter was exposed and the catheter was removed in its entirety. Pressure was held until hemostasis was obtained. A sterile dressing was applied. The patient tolerated the procedure well with no immediate complications.   Please see full dictation under the imaging tab in Epic. The primary team or nephrology team will need to place a request for new tunneled dialysis catheter placement when the line holiday is over.  Alwyn Ren, [place] [phone] 10/28/2023, 2:07 PM

## 2023-10-28 NOTE — Plan of Care (Signed)

## 2023-10-28 NOTE — Progress Notes (Signed)
PROGRESS NOTE    Caleb Simmons  VQQ:[id number] DOB: 06-Oct-1951 DOA: 10/27/2023 PCP: [name], MD    Chief Complaint  Patient presents with   Abnormal Lab    Brief Narrative:   Caleb Simmons is a 72 y.o. 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  He went to dialysis  and was told that his blood cultures were positive for staph and then he had to report back to the hospital.  He does have an external dialysis catheter in his chest which is likely the source.  The patient reports that he does not feel well overall but is no longer having fevers or chills.  Cultures were sent again in ED, and he received IV vancomycin, was admitted for further management.    Assessment & Plan:   Principal Problem:   Bacteremia associated with intravascular line (HCC) Active Problems:   ESRD (end stage renal disease) (HCC)  Staph Aureus bacteremia -Outpatient blood cultures done at the dialysis center growing Staph aureus x 2 -Received IV vancomycin in ED, continue with IV cefazolin, antibiotics management per ID -plan line holiday, IR consulted to remove [facility] catheter, to be reinserted prior to his HD on Friday -Will check 2D echo  ESRD -Patient on HD on Monday and Friday, he received dialysis yesterday, next HD on Monday -TDC catheter removed by IR, currently with line holiday -Continue with midodrine  Anemia of chronic kidney disease -Hemoglobin at baseline -Management per renal, not on Procrit due to history of amyloidosis  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  He completed 2 years of chemotherapy 2 weeks ago and currently on surveillance. -Follow-up oncology in the outpatient   Chronic diastolic congestive heart failure Last TTE a year ago showed EF 55-60%, mild LVH and G1DD.  No evidence of heart failure  exacerbation on exam. -Continue metoprolol succinate 12.5 mg daily   GERD -Resume Protonix 40 mg twice daily   BPH -Resume Flomax 0.4 mg daily   Anxiety -Resume mirtazapine 30 mg at bedtime -Resume home as needed Xanax 1 mg    DVT prophylaxis: Heparin Code Status: Full Family Communication: Patient's friend at bedside Disposition:   Status is: Inpatient    Consultants:  Renal  Subjective:  Significant events overnight, he denies any complaints today, reports he was hungry earlier, he was resumed on a diet Objective: Vitals:   10/28/23 0015 10/28/23 0049 10/28/23 0800 10/28/23 0855  BP: 123/71 (!) 150/77 (!) 161/76   Pulse: (!) 54 70 64   Resp: 20  20   Temp:  98 F (36.7 C) 97.6 F (36.4 C)   TempSrc:  Oral Oral   SpO2: 96% 98% 98%   Weight:    62.8 kg  Height:    5\' 10"  (1.778 m)   No intake or output data in the 24 hours ending 10/28/23 1439 Filed Weights   10/28/23 0855  Weight: 62.8 kg    Examination:   Awake Alert, Oriented X 3, No new F.N deficits, Normal affect Symmetrical Chest wall movement, Good air movement bilaterally, CTAB RRR,No Gallops,Rubs or new Murmurs, No Parasternal Heave +ve B.Sounds, Abd Soft, No tenderness, No rebound - guarding or rigidity. No Cyanosis, Clubbing or edema, No new Rash or bruise     Data Reviewed: I have personally reviewed following labs and imaging studies  CBC: Recent Labs  Lab 10/24/23 1419 10/25/23 0547 10/26/23 0458 10/27/23 1717  WBC 20.2* 11.4* 9.1 7.5  NEUTROABS 19.0*  --   --  5.7  HGB 7.9* 7.9* 7.4* 8.0*  HCT 24.7* 23.8* 22.6* 25.3*  MCV 94.6 93.0 93.0 96.6  PLT 145* 129* 131* 150    Basic Metabolic Panel: Recent Labs  Lab 10/24/23 1419 10/24/23 2137 10/25/23 0547 10/26/23 0458 10/27/23 1717  NA 133*  --  133* 134* 134*  K 3.6  --  3.8 4.4 3.3*  CL 97*  --  98 97* 99  CO2 22  --  23 22 25   GLUCOSE 126*  --  103* 109* 117*  BUN 21  --  32* 53* 25*  CREATININE 3.51*  --  4.50*  5.45* 3.35*  CALCIUM 7.9*  --  7.7* 7.9* 7.8*  MG  --  1.6*  --   --   --   PHOS  --   --  4.7*  --   --     GFR: Estimated Creatinine Clearance: 17.4 mL/min (A) (by C-G formula based on SCr of 3.35 mg/dL (H)).  Liver Function Tests: Recent Labs  Lab 10/24/23 1419 10/25/23 0547 10/26/23 0458 10/27/23 1717  AST 41  --  37 33  ALT 16  --  13 13  ALKPHOS 72  --  58 69  BILITOT 0.9  --  0.6 0.5  PROT 5.3*  --  4.7* 5.2*  ALBUMIN 2.8* 2.4* 2.2* 2.6*    CBG: No results for input(s): "GLUCAP" in the last 168 hours.   Recent Results (from the past 240 hour(s))  Culture, blood (Routine x 2)     Status: None (Preliminary result)   Collection Time: 10/24/23  2:19 PM   Specimen: BLOOD  Result Value Ref Range Status   Specimen Description BLOOD SITE NOT SPECIFIED  Final   Special Requests   Final    BOTTLES DRAWN AEROBIC AND ANAEROBIC Blood Culture adequate volume   Culture   Final    NO GROWTH 3 DAYS Performed at [facility], [address] [place], [place] [address]    Report Status PENDING  Incomplete  Culture, blood (Routine x 2)     Status: None (Preliminary result)   Collection Time: 10/24/23  2:32 PM   Specimen: BLOOD RIGHT ARM  Result Value Ref Range Status   Specimen Description BLOOD RIGHT ARM  Final   Special Requests   Final    BOTTLES DRAWN AEROBIC AND ANAEROBIC Blood Culture results may not be optimal due to an excessive volume of blood received in culture bottles   Culture   Final    NO GROWTH 3 DAYS Performed at [facility], [address], [place], [place] [address]    Report Status PENDING  Incomplete  Resp panel by RT-PCR (RSV, Flu A&B, Covid) Peripheral     Status: None   Collection Time: 10/24/23  2:41 PM   Specimen: Peripheral; Nasal Swab  Result Value Ref Range Status   SARS Coronavirus 2 by RT PCR NEGATIVE NEGATIVE Final   Influenza A by PCR NEGATIVE NEGATIVE Final   Influenza B by PCR NEGATIVE NEGATIVE Final    Comment:  (NOTE) The Xpert Xpress SARS-CoV-2/FLU/RSV plus assay is intended as an aid in the diagnosis of influenza from Nasopharyngeal swab specimens and should not be used as a sole basis for treatment. Nasal washings and aspirates are unacceptable for Xpert Xpress SARS-CoV-2/FLU/RSV testing.  Fact Sheet for Patients:  BloggerCourse.com  Fact Sheet for Healthcare Providers: SeriousBroker.it  This test is not yet approved or cleared by the [place] FDA and has been authorized for detection and/or diagnosis of SARS-CoV-2 by FDA under an Emergency Use Authorization (EUA). This EUA will remain in effect (meaning this test can be used) for the duration of the COVID-19 declaration under Section 564(b)(1) of the Act, 21 U.S.C. section 360bbb-3(b)(1), unless the authorization is terminated or revoked.     Resp Syncytial Virus by PCR NEGATIVE NEGATIVE Final    Comment: (NOTE) Fact Sheet for Patients: BloggerCourse.com  Fact Sheet for Healthcare Providers: SeriousBroker.it  This test is not yet approved or cleared by the [place] FDA and has been authorized for detection and/or diagnosis of SARS-CoV-2 by FDA under an Emergency Use Authorization (EUA). This EUA will remain in effect (meaning this test can be used) for the duration of the COVID-19 declaration under Section 564(b)(1) of the Act, 21 U.S.C. section 360bbb-3(b)(1), unless the authorization is terminated or revoked.  Performed at [facility], [address] [place], [place] [address]   Respiratory (~20 pathogens) panel by PCR     Status: None   Collection Time: 10/25/23  8:09 AM   Specimen: Nasopharyngeal Swab; Respiratory  Result Value Ref Range Status   Adenovirus NOT DETECTED NOT DETECTED Final   Coronavirus 229E NOT DETECTED NOT DETECTED Final    Comment: (NOTE) The Coronavirus on the Respiratory Panel, DOES  NOT test for the novel  Coronavirus (2019 nCoV)    Coronavirus HKU1 NOT DETECTED NOT DETECTED Final   Coronavirus NL63 NOT DETECTED NOT DETECTED Final   Coronavirus OC43 NOT DETECTED NOT DETECTED Final   Metapneumovirus NOT DETECTED NOT DETECTED Final   Rhinovirus / Enterovirus NOT DETECTED NOT DETECTED Final   Influenza A NOT DETECTED NOT DETECTED Final   Influenza B NOT DETECTED NOT DETECTED Final   Parainfluenza Virus 1 NOT DETECTED NOT DETECTED Final   Parainfluenza Virus 2 NOT DETECTED NOT DETECTED Final   Parainfluenza Virus 3 NOT DETECTED NOT DETECTED Final   Parainfluenza Virus 4 NOT DETECTED NOT DETECTED Final   Respiratory Syncytial Virus NOT DETECTED NOT DETECTED Final   Bordetella pertussis NOT DETECTED NOT DETECTED Final   Bordetella Parapertussis NOT DETECTED NOT DETECTED Final   Chlamydophila pneumoniae NOT DETECTED NOT DETECTED Final   Mycoplasma pneumoniae NOT DETECTED NOT DETECTED Final    Comment: Performed at [facility], [address] [place], [place] [address]         Radiology Studies: DG Chest 2 View  Result Date: 10/27/2023 CLINICAL DATA:  Weakness and cough. EXAM: CHEST - 2 VIEW COMPARISON:  Chest radiograph dated 10/24/2023. FINDINGS: Dialysis catheter in similar position. No focal consolidation, pleural effusion, or pneumothorax. The cardiac silhouette is within normal limits. No acute osseous pathology. IMPRESSION: No active cardiopulmonary disease. Electronically Signed   By: Elgie Collard M.D.   On: 10/27/2023 19:37        Scheduled Meds:  aspirin EC  81 mg Oral Daily   Chlorhexidine Gluconate Cloth  6 each Topical Daily   heparin injection (subcutaneous)  5,000 Units Subcutaneous Q8H   midodrine  5 mg Oral TID WC   mirtazapine  30 mg Oral QHS   pantoprazole  40 mg Oral BID AC   senna-docusate  1 tablet Oral Daily   tamsulosin  0.4 mg Oral Daily   Continuous Infusions:   ceFAZolin (ANCEF) IV       LOS:  1 day      Huey Bienenstock, MD [facility]   To contact the attending provider between 7A-7P or the covering provider during after hours 7P-7A, please log into the web site [email or web address] and access using universal [place] password for that web site. If you do not have the password, please call the hospital operator.  10/28/2023, 2:39 PM

## 2023-10-28 NOTE — Consult Note (Signed)
[facility] Renal Consultation Note    Indication for Consultation:  Management of ESRD/hemodialysis, anemia, hypertension/volume, and secondary hyperparathyroidism. PCP:  HPI: Caleb Simmons is a 72 y.o. 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 Patient presented to HD on 10/24/23 with fever, fatigue and weakness. He was given vancomycin but post HD was noted to be weaker so he was sent to the ED. He was admitting obs but blood cultures showed no growth and he was feeling better, so he was discharged. Yesterday, his HD unit received blood culture results that were positive for staph aureus. Line holiday was recommended and nephrology PA recommended catheter exchange, but patient opted to return to the ED as he was still not feeling well overall. He was started on IV vancomycin.   Patient reports he is feeling generally unwell but denies fever and chills. Reports he is very hungry, was NPO at some points and does not like the hospital food. He denies pain, bleeding and drainage from his [facility]. Denies HA, dizziness, nausea, abdominal pain, SOB, edema, orthopnea, CP, palpitations. Reportedly has a positive FOBT during last admission but no further bleeding reported.   Past Medical History:  Diagnosis Date   Cancer [facility])    Past Surgical History:  Procedure Laterality Date   APPENDECTOMY     IR ANGIO INTRA EXTRACRAN SEL COM CAROTID INNOMINATE BILAT MOD SED  01/16/2022   IR ANGIO INTRA EXTRACRAN SEL COM CAROTID INNOMINATE BILAT MOD SED  07/16/2022   IR ANGIO VERTEBRAL SEL SUBCLAVIAN INNOMINATE BILAT MOD SED  01/16/2022   IR ANGIO VERTEBRAL SEL SUBCLAVIAN INNOMINATE UNI R MOD SED  07/16/2022   IR ANGIO VERTEBRAL SEL VERTEBRAL UNI L MOD SED  07/16/2022   IR CT HEAD LTD  01/18/2022   IR EMBO ART  VEN HEMORR LYMPH EXTRAV  INC GUIDE ROADMAPPING  08/30/2021   IR FLUORO GUIDE CV  LINE RIGHT  08/30/2021   IR FLUORO GUIDE CV LINE RIGHT  09/18/2021   IR FLUORO GUIDE CV LINE RIGHT  05/02/2022   IR PERCUTANEOUS ART THROMBECTOMY/INFUSION INTRACRANIAL INC DIAG ANGIO  01/18/2022   IR RADIOLOGIST EVAL & MGMT  08/02/2022   IR RENAL SELECTIVE  UNI INC S&I MOD SED  08/31/2021   IR US GUIDE VASC ACCESS RIGHT  08/30/2021   IR US GUIDE VASC ACCESS RIGHT  08/30/2021   IR US GUIDE VASC ACCESS RIGHT  09/18/2021   IR US GUIDE VASC ACCESS RIGHT  01/16/2022   IR US GUIDE VASC ACCESS RIGHT  07/16/2022   IR VENOCAVAGRAM SVC  05/02/2022   RADIOLOGY WITH ANESTHESIA N/A 01/18/2022   Procedure: Cerebral angioplasty with possible stenting;  Surgeon: Julieanne Cotton, MD;  Location: [facility] OR;  Service: Radiology;  Laterality: N/A;   History reviewed. No pertinent family history. Social History:  reports that he has been smoking cigarettes. He has a 6.3 pack-year smoking history. He has never used smokeless tobacco. No history on file for alcohol use and drug use.  ROS: As per HPI otherwise negative.   Physical Exam: Vitals:   10/28/23 0015 10/28/23 0049 10/28/23 0800 10/28/23 0855  BP: 123/71 (!) 150/77 (!) 161/76   Pulse: (!) 54 70 64   Resp: 20  20   Temp:  98 F (36.7 C) 97.6 F (36.4 C)   TempSrc:  Oral Oral   SpO2: 96% 98% 98%   Weight:  62.8 kg  Height:    5\' 10"  (1.778 m)     General: Alert male in NAD Head: Normocephalic, atraumatic, sclera non-icteric, mucus membranes are moist. Lungs: Clear bilaterally to auscultation without wheezes, rales, or rhonchi. Breathing is unlabored. Heart: RRR with normal S1, S2. No murmurs, rubs, or gallops appreciated. Abdomen: Soft, non-distended with normoactive bowel sounds. No rebound/guarding. No obvious abdominal masses. Musculoskeletal:  Strength and tone appear normal for age. Lower extremities: No edema or ischemic changes, no open wounds. Neuro: Alert and oriented X 3. Moves all extremities spontaneously. Psych:  Responds to questions  appropriately with a normal affect. Dialysis Access: [facility] with intact bandage, no drainage.   Allergies  Allergen Reactions   Iron Sucrose Nausea And Vomiting   Prior to Admission medications   Medication Sig Start Date End Date Taking? Authorizing Provider  acetaminophen (TYLENOL) 500 MG tablet Take 1,000 mg by mouth every 6 (six) hours as needed for moderate pain.   Yes [provider]  ALPRAZolam (XANAX) 1 MG tablet TAKE 1 TABLET BY MOUTH EVERY 8 HOURS AS NEEDED FOR ANXIETY OR SLEEP Patient taking differently: Take 1 mg by mouth every 8 (eight) hours as needed for sleep. 10/14/23  Yes Pickenpack-Cousar, Arty Baumgartner, NP  aspirin 81 MG EC tablet Take 1 tablet (81 mg total) by mouth daily. Swallow whole. 01/20/22  Yes Almon Hercules, MD  cefdinir (OMNICEF) 300 MG capsule Take 300 mg (1 capsule) on Monday after dialysis then again take 300 mg (1 capsule) on Wednesday afternoon Patient taking differently: Take 300 mg by mouth See admin instructions. Take 300 mg (1 capsule) on Monday after dialysis then again take 300 mg (1 capsule) on Wednesday afternoon 10/26/23  Yes Danford, Earl Lites, MD  fluticasone (FLONASE) 50 MCG/ACT nasal spray Place 1 spray into both nostrils daily.   Yes [provider]  metoprolol succinate (TOPROL-XL) 25 MG 24 hr tablet TAKE 1/2 TABLET BY MOUTH EVERY DAY 08/21/23  Yes Laurey Morale, MD  midodrine (PROAMATINE) 5 MG tablet TAKE 1 TABLET BY MOUTH 3 TIMES DAILY WITH MEALS Patient taking differently: Take 5 mg by mouth 3 (three) times daily with meals. 08/11/23  Yes Laurey Morale, MD  mirtazapine (REMERON) 30 MG tablet TAKE 1 TABLET BY MOUTH AT BEDTIME 10/15/23  Yes Pickenpack-Cousar, Athena N, NP  pantoprazole (PROTONIX) 40 MG tablet TAKE 1 TABLET BY MOUTH 2 TIMES DAILY BEFORE MEALS Patient taking differently: Take 40 mg by mouth 2 (two) times daily before a meal. 10/15/23  Yes [name], MD  SENNA PLUS 8.6-50 MG tablet Take 1 tablet by mouth daily.  07/14/23  Yes [provider]  tamsulosin (FLOMAX) 0.4 MG CAPS capsule TAKE 1 CAPSULE BY MOUTH ONCE DAILY 10/15/23  Yes [name], MD  zolpidem (AMBIEN) 5 MG tablet Take 1 tablet (5 mg total) by mouth at bedtime as needed. for sleep 10/08/23  Yes Pickenpack-Cousar, Arty Baumgartner, NP  ferrous sulfate 325 (65 FE) MG EC tablet Take 1 tablet (325 mg total) by mouth every other day. 10/26/23   Danford, Earl Lites, MD  Nutritional Supplements (FEEDING SUPPLEMENT, NEPRO CARB STEADY,) LIQD Take 237 mLs by mouth 2 (two) times daily between meals. Patient not taking: Reported on 10/27/2023 10/26/23   Alberteen Sam, MD   Current Facility-Administered Medications  Medication Dose Route Frequency Provider Last Rate Last Admin   acetaminophen (TYLENOL) tablet 1,000 mg  1,000 mg Oral Q6H PRN Buena Irish, MD   1,000 mg at 10/28/23  1610   ALPRAZolam Prudy Feeler) tablet 1 mg  1 mg Oral Q8H PRN Buena Irish, MD   1 mg at 10/28/23 0127   aspirin EC tablet 81 mg  81 mg Oral Daily Buena Irish, MD   81 mg at 10/28/23 0835   [START ON 10/29/2023] cefdinir (OMNICEF) capsule 300 mg  300 mg Oral Once Buena Irish, MD       midodrine (PROAMATINE) tablet 5 mg  5 mg Oral TID WC Buena Irish, MD   5 mg at 10/28/23 0834   mirtazapine (REMERON) tablet 30 mg  30 mg Oral QHS Buena Irish, MD   30 mg at 10/27/23 2300   pantoprazole (PROTONIX) EC tablet 40 mg  40 mg Oral BID AC Buena Irish, MD   40 mg at 10/28/23 9604   senna-docusate (Senokot-S) tablet 1 tablet  1 tablet Oral Daily Buena Irish, MD       tamsulosin [facility]) capsule 0.4 mg  0.4 mg Oral Daily Buena Irish, MD   0.4 mg at 10/28/23 5409   vancomycin variable dose per unstable renal function (pharmacist dosing)   Does not apply See admin instructions [name], [facility]       Facility-Administered Medications Ordered in Other Encounters  Medication Dose Route Frequency Provider Last Rate Last Admin   0.9  %  sodium chloride infusion (Manually program via Guardrails IV Fluids)  250 mL Intravenous Once [name], MD       heparin lock flush 100 unit/mL  500 Units Intracatheter Once [name], MD       sodium chloride flush (NS) 0.9 % injection 10 mL  10 mL Intracatheter Once [name], MD       Labs: Basic Metabolic Panel: Recent Labs  Lab 10/25/23 0547 10/26/23 0458 10/27/23 1717  NA 133* 134* 134*  K 3.8 4.4 3.3*  CL 98 97* 99  CO2 23 22 25   GLUCOSE 103* 109* 117*  BUN 32* 53* 25*  CREATININE 4.50* 5.45* 3.35*  CALCIUM 7.7* 7.9* 7.8*  PHOS 4.7*  --   --    Liver Function Tests: Recent Labs  Lab 10/24/23 1419 10/25/23 0547 10/26/23 0458 10/27/23 1717  AST 41  --  37 33  ALT 16  --  13 13  ALKPHOS 72  --  58 69  BILITOT 0.9  --  0.6 0.5  PROT 5.3*  --  4.7* 5.2*  ALBUMIN 2.8* 2.4* 2.2* 2.6*   No results for input(s): "LIPASE", "AMYLASE" in the last 168 hours. No results for input(s): "AMMONIA" in the last 168 hours. CBC: Recent Labs  Lab 10/24/23 1419 10/25/23 0547 10/26/23 0458 10/27/23 1717  WBC 20.2* 11.4* 9.1 7.5  NEUTROABS 19.0*  --   --  5.7  HGB 7.9* 7.9* 7.4* 8.0*  HCT 24.7* 23.8* 22.6* 25.3*  MCV 94.6 93.0 93.0 96.6  PLT 145* 129* 131* 150   Cardiac Enzymes: No results for input(s): "CKTOTAL", "CKMB", "CKMBINDEX", "TROPONINI" in the last 168 hours. CBG: No results for input(s): "GLUCAP" in the last 168 hours. Iron Studies: No results for input(s): "IRON", "TIBC", "TRANSFERRIN", "FERRITIN" in the last 72 hours. Studies/Results: DG Chest 2 View  Result Date: 10/27/2023 CLINICAL DATA:  Weakness and cough. EXAM: CHEST - 2 VIEW COMPARISON:  Chest radiograph dated 10/24/2023. FINDINGS: Dialysis catheter in similar position. No focal consolidation, pleural effusion, or pneumothorax. The cardiac silhouette is within normal limits. No acute osseous pathology. IMPRESSION: No active cardiopulmonary disease. Electronically Signed   By: Burtis Junes  Radparvar M.D.   On:  10/27/2023 19:37    Dialysis Orders: Center: [facility]  on Mon/Fri. 180Nre 4 hours BFR 400 DFR Auto 1.5 EDW 63.5kg 2K 2.5 Ca TDC Heparin 2000 unit bolus Calcitriol 0.5 mcg PO q HD Not on ESA (hx amyloidosis)  Assessment/Plan:  Staph aureus bacteremia: Outpatient blood cultures positive for staph aureus x2. On vancomycin. Will arrange for line holiday. Consulting IR to remove catheter. Next HD is not until Friday so will plan to replace TDC at that time.  ESRD:  On HD on Mon/Fri, did go to dialysis on Monday. Next HD Friday.   Hypertension/volume: BP is elevated. Appears he is on midodrine TID, hold for now.   Anemia: Hgb 8.0. 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 Reportedly had a positive FOBT last admission, monitor Hgb and transfuse PRN.   Metabolic bone disease: Calcium low, will check albumin. Phos at goal. Continue VDRA  Nutrition:  Has not been eating well for the past several days, will resume diet as soon as possible Amyloidosis: Reports he has been on chemo for 2 years, currently not on chemo for the past month and they are monitoring labs. Reports stable so far.   Rogers Blocker, PA-C 10/28/2023, 8:56 AM  [facility] Pager: [phone]

## 2023-10-28 NOTE — ED Notes (Signed)
ED TO INPATIENT HANDOFF REPORT  ED Nurse Name and Phone #: [phone]   S Name/Age/Gender Caleb Simmons 72 y.o. male Room/Bed: 001C/001C  Code Status   Code Status: Prior  Home/SNF/Other Home Patient oriented to: self, place, time, and situation Is this baseline? Yes   Triage Complete: Triage complete  Chief Complaint Bacteremia associated with intravascular line (HCC) [T82.7XXA, R78.81]  Triage Note Pt reports he was d/c from the hospital this morning. When he returned home he was told to come back to the hospital due to his dialysis catheter "being infected with staph."   Allergies Allergies  Allergen Reactions   Iron Sucrose Nausea And Vomiting    Level of Care/Admitting Diagnosis ED Disposition     ED Disposition  Admit   Condition  --   Comment  Hospital Area: [facility] [[id number]]  Level of Care: Telemetry Medical [104]  May admit patient to [facility] or [facility] if equivalent level of care is available:: No  Covid Evaluation: Asymptomatic - no recent exposure (last 10 days) testing not required  Diagnosis: Bacteremia associated with intravascular line [facility]) [147829]  Admitting Physician: Buena Irish [[id number]]  Attending Physician: Buena Irish [phone]  Certification:: I certify this patient will need inpatient services for at least 2 midnights  Expected Medical Readiness: 10/29/2023          B Medical/Surgery History Past Medical History:  Diagnosis Date   Cancer [facility])    Past Surgical History:  Procedure Laterality Date   APPENDECTOMY     IR ANGIO INTRA EXTRACRAN SEL COM CAROTID INNOMINATE BILAT MOD SED  01/16/2022   IR ANGIO INTRA EXTRACRAN SEL COM CAROTID INNOMINATE BILAT MOD SED  07/16/2022   IR ANGIO VERTEBRAL SEL SUBCLAVIAN INNOMINATE BILAT MOD SED  01/16/2022   IR ANGIO VERTEBRAL SEL SUBCLAVIAN INNOMINATE UNI R MOD SED  07/16/2022   IR ANGIO VERTEBRAL SEL VERTEBRAL UNI L MOD SED  07/16/2022   IR CT HEAD LTD   01/18/2022   IR EMBO ART  VEN HEMORR LYMPH EXTRAV  INC GUIDE ROADMAPPING  08/30/2021   IR FLUORO GUIDE CV LINE RIGHT  08/30/2021   IR FLUORO GUIDE CV LINE RIGHT  09/18/2021   IR FLUORO GUIDE CV LINE RIGHT  05/02/2022   IR PERCUTANEOUS ART THROMBECTOMY/INFUSION INTRACRANIAL INC DIAG ANGIO  01/18/2022   IR RADIOLOGIST EVAL & MGMT  08/02/2022   IR RENAL SELECTIVE  UNI INC S&I MOD SED  08/31/2021   IR US GUIDE VASC ACCESS RIGHT  08/30/2021   IR US GUIDE VASC ACCESS RIGHT  08/30/2021   IR US GUIDE VASC ACCESS RIGHT  09/18/2021   IR US GUIDE VASC ACCESS RIGHT  01/16/2022   IR US GUIDE VASC ACCESS RIGHT  07/16/2022   IR VENOCAVAGRAM SVC  05/02/2022   RADIOLOGY WITH ANESTHESIA N/A 01/18/2022   Procedure: Cerebral angioplasty with possible stenting;  Surgeon: Julieanne Cotton, MD;  Location: [facility] OR;  Service: Radiology;  Laterality: N/A;     A IV Location/Drains/Wounds Patient Lines/Drains/Airways Status     Active Line/Drains/Airways     Name Placement date Placement time Site Days   Peripheral IV 10/27/23 20 G Posterior;Right Forearm 10/27/23  2055  Forearm  1   Hemodialysis Catheter Right Internal jugular Double lumen Permanent (Tunneled) 05/02/22  1115  Internal jugular  544   Wound / Incision (Open or Dehisced) 10/25/23 Non-pressure wound Thigh Anterior;Right lesion to right thigh 10/25/23  0000  Thigh  3  Intake/Output Last 24 hours No intake or output data in the 24 hours ending 10/28/23 0001  Labs/Imaging Results for orders placed or performed during the hospital encounter of 10/27/23 (from the past 48 hour(s))  CBC with Differential     Status: Abnormal   Collection Time: 10/27/23  5:17 PM  Result Value Ref Range   WBC 7.5 4.0 - 10.5 K/uL   RBC 2.62 (L) 4.22 - 5.81 MIL/uL   Hemoglobin 8.0 (L) 13.0 - 17.0 g/dL   HCT 16.1 (L) 09.6 - 04.5 %   MCV 96.6 80.0 - 100.0 fL   MCH 30.5 26.0 - 34.0 pg   MCHC 31.6 30.0 - 36.0 g/dL   RDW 40.9 81.1 - 91.4 %   Platelets 150 150 - 400 K/uL    nRBC 0.0 0.0 - 0.2 %   Neutrophils Relative % 76 %   Neutro Abs 5.7 1.7 - 7.7 K/uL   Lymphocytes Relative 14 %   Lymphs Abs 1.1 0.7 - 4.0 K/uL   Monocytes Relative 7 %   Monocytes Absolute 0.6 0.1 - 1.0 K/uL   Eosinophils Relative 1 %   Eosinophils Absolute 0.1 0.0 - 0.5 K/uL   Basophils Relative 1 %   Basophils Absolute 0.0 0.0 - 0.1 K/uL   Immature Granulocytes 1 %   Abs Immature Granulocytes 0.06 0.00 - 0.07 K/uL    Comment: Performed at [facility], [address] [place], [place] [address]  Comprehensive metabolic panel     Status: Abnormal   Collection Time: 10/27/23  5:17 PM  Result Value Ref Range   Sodium 134 (L) 135 - 145 mmol/L   Potassium 3.3 (L) 3.5 - 5.1 mmol/L   Chloride 99 98 - 111 mmol/L   CO2 25 22 - 32 mmol/L   Glucose, Bld 117 (H) 70 - 99 mg/dL    Comment: Glucose reference range applies only to samples taken after fasting for at least 8 hours.   BUN 25 (H) 8 - 23 mg/dL   Creatinine, Ser 6.21 (H) 0.61 - 1.24 mg/dL   Calcium 7.8 (L) 8.9 - 10.3 mg/dL   Total Protein 5.2 (L) 6.5 - 8.1 g/dL   Albumin 2.6 (L) 3.5 - 5.0 g/dL   AST 33 15 - 41 U/L   ALT 13 0 - 44 U/L   Alkaline Phosphatase 69 38 - 126 U/L   Total Bilirubin 0.5 <1.2 mg/dL   GFR, Estimated 19 (L) >60 mL/min    Comment: (NOTE) Calculated using the CKD-EPI Creatinine Equation (2021)    Anion gap 10 5 - 15    Comment: Performed at [facility], [address] [place], [place] [address]  Lactic acid, plasma     Status: None   Collection Time: 10/27/23  5:17 PM  Result Value Ref Range   Lactic Acid, Venous 1.2 0.5 - 1.9 mmol/L    Comment: Performed at [facility], [address], [place], [place] [address]  Lactic acid, plasma     Status: None   Collection Time: 10/27/23  8:54 PM  Result Value Ref Range   Lactic Acid, Venous 1.2 0.5 - 1.9 mmol/L    Comment: Performed at [facility], [address] [place], [place] [address]   DG Chest 2 View  Result Date:  10/27/2023 CLINICAL DATA:  Weakness and cough. EXAM: CHEST - 2 VIEW COMPARISON:  Chest radiograph dated 10/24/2023. FINDINGS: Dialysis catheter in similar position. No focal consolidation, pleural  effusion, or pneumothorax. The cardiac silhouette is within normal limits. No acute osseous pathology. IMPRESSION: No active cardiopulmonary disease. Electronically Signed   By: Elgie Collard M.D.   On: 10/27/2023 19:37    Pending Labs Unresulted Labs (From admission, onward)     Start     Ordered   10/27/23 1717  Blood culture (routine x 2)  BLOOD CULTURE X 2,   R      10/27/23 1717            Vitals/Pain Today's Vitals   10/27/23 2200 10/27/23 2215 10/27/23 2230 10/27/23 2245  BP: (!) 149/81 (!) 137/92 (!) 158/93 (!) 175/90  Pulse: 65 78 75 65  Resp: (!) 21 11 20 18   Temp:      TempSrc:      SpO2: 98% 100% 97% 100%  PainSc:        Isolation Precautions No active isolations  Medications Medications  vancomycin variable dose per unstable renal function (pharmacist dosing) (has no administration in time range)  acetaminophen (TYLENOL) tablet 1,000 mg (has no administration in time range)  aspirin EC tablet 81 mg (has no administration in time range)  ALPRAZolam (XANAX) tablet 1 mg (has no administration in time range)  mirtazapine (REMERON) tablet 30 mg (30 mg Oral Given 10/27/23 2300)  pantoprazole (PROTONIX) EC tablet 40 mg (has no administration in time range)  senna-docusate (Senokot-S) tablet 1 tablet (has no administration in time range)  tamsulosin (FLOMAX) capsule 0.4 mg (has no administration in time range)  midodrine (PROAMATINE) tablet 5 mg (has no administration in time range)  cefdinir (OMNICEF) capsule 300 mg (has no administration in time range)  vancomycin (VANCOREADY) IVPB 1500 mg/300 mL (0 mg Intravenous Stopped 10/27/23 2338)    Mobility walks with device     Focused Assessments Renal Assessment Handoff:  Hemodialysis Schedule: Monday and Friday Last  Hemodialysis date and time: 10/27/23 @0600     Restricted appendage: N/A    R Recommendations: See Admitting Provider Note  Report given to:   Additional Notes: Patient is A&O X4, he walks with 1+ assist or walker, Please call with any questions

## 2023-10-28 NOTE — Consult Note (Signed)
[facility]                                                                                        Patient Identification: Patient Name: Caleb Simmons MRN: [id number] Admit Date: 10/27/2023  4:01 PM Today's Date: 10/28/2023 Reason for consult: bacteremia  Requesting provider: champ autoconsult  Principal Problem:   Bacteremia associated with intravascular line [facility]) Active Problems:   ESRD (end stage renal disease) (HCC)   Antibiotics:  Vancomycin 11/1- Cefepime 11/1-11/4  Lines/Hardware:  Assessment # MSSA bacteremia/CLABSI # ESRD on HD   Recommendations  Will switch IV abtx to cefazolin with HD Fu repeat blood cx TTE and TEE if negative TTE for vegetations  Monitor CBC and BMP as well as metastatic sites of infection     Rest of the management as per the primary team. Please call with questions or concerns.  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 Recently admitted 11/1-11/3 with sepsis, thought to be 2/2 pna and discharged on cefdinir. He had a blood cx done at the HD center which grew MSSA ( media). He was still not feeling well on ED arrival.   At ED afebrile.  Lab remarkable for Na 134, k 3.3, albumin 2.6, lactic acid 1.2, wbc 7.5 11/2 blood cx MSSA  11/1 sp removal of Rt internal jugular HDC    ROS: General- Denies fever, chills, loss of appetite and loss of weight HEENT - Denies headache, blurry vision, neck pain, sinus pain Chest - Denies any chest pain, SOB or cough CVS- Denies any dizziness/lightheadedness, syncopal attacks, palpitations Abdomen- Denies any nausea, vomiting, abdominal pain, hematochezia and  diarrhea Neuro - Denies any weakness, numbness, tingling sensation Psych - Denies any changes in mood irritability or depressive symptoms GU- Denies any burning, dysuria, hematuria or increased frequency of urination Skin - denies any rashes/lesions MSK - denies any joint pain/swelling or restricted ROM   Past Medical History:  Diagnosis Date   Cancer [facility])    Past Surgical History:  Procedure Laterality Date   APPENDECTOMY     IR ANGIO INTRA EXTRACRAN SEL COM CAROTID INNOMINATE BILAT MOD SED  01/16/2022   IR ANGIO INTRA EXTRACRAN SEL COM CAROTID INNOMINATE BILAT MOD SED  07/16/2022   IR ANGIO VERTEBRAL SEL SUBCLAVIAN INNOMINATE BILAT MOD SED  01/16/2022   IR ANGIO VERTEBRAL SEL SUBCLAVIAN INNOMINATE UNI R MOD SED  07/16/2022   IR ANGIO VERTEBRAL SEL VERTEBRAL UNI L MOD SED  07/16/2022   IR CT HEAD LTD  01/18/2022   IR EMBO ART  VEN HEMORR LYMPH EXTRAV  INC GUIDE ROADMAPPING  08/30/2021   IR FLUORO GUIDE CV LINE RIGHT  08/30/2021   IR FLUORO GUIDE CV LINE RIGHT  09/18/2021   IR FLUORO GUIDE CV LINE RIGHT  05/02/2022   IR PERCUTANEOUS ART THROMBECTOMY/INFUSION INTRACRANIAL INC DIAG ANGIO  01/18/2022   IR RADIOLOGIST EVAL & MGMT  08/02/2022   IR REMOVAL TUN CV CATH W/O  FL  10/28/2023   IR RENAL SELECTIVE  UNI INC S&I MOD SED  08/31/2021   IR US GUIDE VASC ACCESS RIGHT  08/30/2021   IR US GUIDE VASC ACCESS RIGHT  08/30/2021   IR US GUIDE VASC ACCESS RIGHT  09/18/2021   IR US GUIDE VASC ACCESS RIGHT  01/16/2022   IR US GUIDE VASC ACCESS RIGHT  07/16/2022   IR VENOCAVAGRAM SVC  05/02/2022   RADIOLOGY WITH ANESTHESIA N/A 01/18/2022   Procedure: Cerebral angioplasty with possible stenting;  Surgeon: Julieanne Cotton, MD;  Location: [facility] OR;  Service: Radiology;  Laterality: N/A;     Scheduled Meds:  aspirin EC  81 mg Oral Daily   Chlorhexidine Gluconate Cloth  6 each Topical Daily   heparin injection (subcutaneous)  5,000 Units Subcutaneous Q8H   midodrine  5 mg Oral TID WC   mirtazapine  30 mg Oral QHS    pantoprazole  40 mg Oral BID AC   senna-docusate  1 tablet Oral Daily   tamsulosin  0.4 mg Oral Daily   Continuous Infusions:   ceFAZolin (ANCEF) IV     PRN Meds:.acetaminophen, ALPRAZolam  Allergies  Allergen Reactions   Iron Sucrose Nausea And Vomiting   Social History   Socioeconomic History   Marital status: Married    Spouse name: Not on file   Number of children: Not on file   Years of education: Not on file   Highest education level: Not on file  Occupational History   Not on file  Tobacco Use   Smoking status: Some Days    Current packs/day: 0.25    Average packs/day: 0.3 packs/day for 25.0 years (6.3 ttl pk-yrs)    Types: Cigarettes   Smokeless tobacco: Never  Substance and Sexual Activity   Alcohol use: Not on file   Drug use: Not on file   Sexual activity: Not on file  Other Topics Concern   Not on file  Social History Narrative   Not on file   Social Determinants of Health   Financial Resource Strain: Low Risk  (09/11/2021)   Overall Financial Resource Strain (CARDIA)    Difficulty of Paying Living Expenses: Not very hard  Food Insecurity: No Food Insecurity (10/28/2023)   Hunger Vital Sign    Worried About Running Out of Food in the Last Year: Never true    Ran Out of Food in the Last Year: Never true  Transportation Needs: No Transportation Needs (10/28/2023)   PRAPARE - Administrator, Civil Service (Medical): No    Lack of Transportation (Non-Medical): No  Physical Activity: Not on file  Stress: Not on file  Social Connections: Not on file  Intimate Partner Violence: Not At Risk (10/28/2023)   Humiliation, Afraid, Rape, and Kick questionnaire    Fear of Current or Ex-Partner: No    Emotionally Abused: No    Physically Abused: No    Sexually Abused: No   History reviewed. No pertinent family history.     Vitals    Physical Exam Constitutional:      Comments:   Cardiovascular:     Rate and Rhythm: Normal rate and  regular rhythm.     Heart sounds: No murmur heard.   Pulmonary:     Effort: Pulmonary effort is normal.     Comments:   Abdominal:     Palpations: Abdomen is soft.     Tenderness:   Musculoskeletal:        General: No swelling  or tenderness.   Skin:    Comments:   Neurological:     General: No focal deficit present.   Psychiatric:        Mood and Affect: Mood normal.    Pertinent Microbiology -   Pertinent Lab seen by me: -  Pertinent Imagings/Other Imagings Plain films and CT images have been personally visualized and interpreted; radiology reports have been reviewed. Decision making incorporated into the Impression / Recommendations.  I have personally spent 85 minutes involved in face-to-face and non-face-to-face activities for this patient on the day of the visit. Professional time spent includes the following activities: Preparing to see the patient (review of tests), Obtaining and/or reviewing separately obtained history (admission/discharge record), Performing a medically appropriate examination and/or evaluation , Ordering medications/tests/procedures, referring and communicating with other health care professionals, Documenting clinical information in the EMR, Independently interpreting results (not separately reported), Communicating results to the patient/family/caregiver, Counseling and educating the patient/family/caregiver and Care coordination (not separately reported).  Electronically signed by:   Plan d/w requesting provider as well as ID pharm D  Of note, portions of this note may have been created with voice recognition software. While this note has been edited for accuracy, occasional wrong-word or 'sound-a-like' substitutions may have occurred due to the inherent limitations of voice recognition software.   Odette Fraction, MD Infectious Disease Physician [facility] Pager: [phone]

## 2023-10-28 NOTE — Progress Notes (Signed)
   Blood cultures from dialysis center drawn 10/24/23

## 2023-10-29 ENCOUNTER — Inpatient Hospital Stay (HOSPITAL_COMMUNITY): Payer: Medicare HMO

## 2023-10-29 DIAGNOSIS — D649 Anemia, unspecified: Secondary | ICD-10-CM

## 2023-10-29 DIAGNOSIS — R7881 Bacteremia: Secondary | ICD-10-CM

## 2023-10-29 DIAGNOSIS — K219 Gastro-esophageal reflux disease without esophagitis: Secondary | ICD-10-CM

## 2023-10-29 DIAGNOSIS — T827XXA Infection and inflammatory reaction due to other cardiac and vascular devices, implants and grafts, initial encounter: Secondary | ICD-10-CM | POA: Diagnosis not present

## 2023-10-29 DIAGNOSIS — T80211A Bloodstream infection due to central venous catheter, initial encounter: Secondary | ICD-10-CM | POA: Diagnosis not present

## 2023-10-29 DIAGNOSIS — N186 End stage renal disease: Secondary | ICD-10-CM | POA: Diagnosis not present

## 2023-10-29 DIAGNOSIS — Z992 Dependence on renal dialysis: Secondary | ICD-10-CM

## 2023-10-29 LAB — BASIC METABOLIC PANEL
Anion gap: 8 (ref 5–15)
BUN: 39 mg/dL — ABNORMAL HIGH (ref 8–23)
CO2: 24 mmol/L (ref 22–32)
Calcium: 7.3 mg/dL — ABNORMAL LOW (ref 8.9–10.3)
Chloride: 104 mmol/L (ref 98–111)
Creatinine, Ser: 4.4 mg/dL — ABNORMAL HIGH (ref 0.61–1.24)
GFR, Estimated: 13 mL/min — ABNORMAL LOW (ref >60)
Glucose, Bld: 114 mg/dL — ABNORMAL HIGH (ref 70–99)
Potassium: 3.5 mmol/L (ref 3.5–5.1)
Sodium: 136 mmol/L (ref 135–145)

## 2023-10-29 LAB — ECHOCARDIOGRAM COMPLETE
AR max vel: 1.13 cm2
AV Area VTI: 0.9 cm2
AV Area mean vel: 1.34 cm2
AV Mean grad: 6 mm[Hg]
AV Peak grad: 14.6 mm[Hg]
Ao pk vel: 1.91 m/s
Area-P 1/2: 4.54 cm2
Calc EF: 47.7 %
Height: 70 in
S' Lateral: 4.4 cm
Single Plane A2C EF: 45.3 %
Single Plane A4C EF: 50.8 %
Weight: 2215.18 [oz_av]

## 2023-10-29 LAB — CBC
HCT: 22.8 % — ABNORMAL LOW (ref 39.0–52.0)
Hemoglobin: 7.3 g/dL — ABNORMAL LOW (ref 13.0–17.0)
MCH: 29.9 pg (ref 26.0–34.0)
MCHC: 32 g/dL (ref 30.0–36.0)
MCV: 93.4 fL (ref 80.0–100.0)
Platelets: 141 10*3/uL — ABNORMAL LOW (ref 150–400)
RBC: 2.44 MIL/uL — ABNORMAL LOW (ref 4.22–5.81)
RDW: 14.6 % (ref 11.5–15.5)
WBC: 7 10*3/uL (ref 4.0–10.5)
nRBC: 0 % (ref 0.0–0.2)

## 2023-10-29 LAB — CULTURE, BLOOD (ROUTINE X 2)
Culture: NO GROWTH
Culture: NO GROWTH
Special Requests: ADEQUATE

## 2023-10-29 MED ORDER — OXYCODONE HCL 5 MG PO TABS
5.0000 mg | ORAL_TABLET | Freq: Four times a day (QID) | ORAL | Status: DC | PRN
  Administered 2023-10-29 – 2023-10-31 (×4): 5 mg via ORAL
  Filled 2023-10-29 (×5): qty 1

## 2023-10-29 MED ORDER — SODIUM CHLORIDE 0.9% FLUSH
10.0000 mL | Freq: Two times a day (BID) | INTRAVENOUS | Status: DC
  Administered 2023-10-29: 10 mL via INTRAVENOUS

## 2023-10-29 MED ORDER — ACETAMINOPHEN 500 MG PO TABS
1000.0000 mg | ORAL_TABLET | Freq: Four times a day (QID) | ORAL | Status: DC | PRN
  Administered 2023-10-30 – 2023-10-31 (×4): 1000 mg via ORAL
  Filled 2023-10-29 (×4): qty 2

## 2023-10-29 NOTE — TOC CM/SW Note (Signed)
Transition of Care [facility]) - Inpatient Brief Assessment   Patient Details  Name: ARVINE CLAYBURN MRN: [id number] Date of Birth: 03/12/1951  Transition of Care [facility]) CM/SW Contact:    Mearl Latin, LCSW Phone Number: 10/29/2023, 9:28 AM   Clinical Narrative: Patient admitted from home with Bacteremia associated with intravascular line. No current TOC needs identified at this time but please place consult if needs arise.    Transition of Care Asessment: Insurance and Status: Insurance coverage has been reviewed Patient has primary care physician: Yes Home environment has been reviewed: From home Prior level of function:: Independent Prior/Current Home Services: No current home services Social Determinants of Health Reivew: SDOH reviewed no interventions necessary Readmission risk has been reviewed: Yes Transition of care needs: no transition of care needs at this time

## 2023-10-29 NOTE — H&P (View-Only) (Signed)
PROGRESS NOTE        PATIENT DETAILS Name: Caleb Simmons Age: 72 y.o. Sex: male Date of Birth: March 16, 1951 Admit Date: 10/27/2023 Admitting Physician Buena Irish, MD ZOX:WRUE, Terrace [place], MD  Brief Summary: Patient is a 72 y.o.  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  Significant events: 11/4>> admit to [facility]-outpatient blood cultures positive for MSSA  Significant studies: 11/4>> chest x-ray: No pneumonia 11/6>> echo: EF 50-55%, grade 3 diastolic dysfunction.  Significant microbiology data: 11/4>> blood culture: No growth  Procedures: None  Consults: Nephrology ID  Subjective: Lying comfortably in bed-denies any chest pain or shortness of breath.  Objective: Vitals: Blood pressure (!) 160/85, pulse 67, temperature 98.1 F (36.7 C), temperature source Oral, resp. rate 18, height 5\' 10"  (1.778 m), weight 62.8 kg, SpO2 98%.   Exam: Gen Exam:Alert awake-not in any distress HEENT:atraumatic, normocephalic Chest: B/L clear to auscultation anteriorly CVS:S1S2 regular Abdomen:soft non tender, non distended Extremities:no edema Neurology: Non focal Skin: no rash  Pertinent Labs/Radiology:    Latest Ref Rng & Units 10/29/2023    3:54 AM 10/27/2023    5:17 PM 10/26/2023    4:58 AM  CBC  WBC 4.0 - 10.5 K/uL 7.0  7.5  9.1   Hemoglobin 13.0 - 17.0 g/dL 7.3  8.0  7.4   Hematocrit 39.0 - 52.0 % 22.8  25.3  22.6   Platelets 150 - 400 K/uL 141  150  131     Lab Results  Component Value Date   NA 136 10/29/2023   K 3.5 10/29/2023   CL 104 10/29/2023   CO2 24 10/29/2023      Assessment/Plan: MSSA bacteremia Likely associated with HD catheter-which was removed 11/5 by IR Line holiday in progress Repeat cultures negative so far Echo without any obvious vegetations On Ancef Await further recommendations from infectious  disease  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   Weight as of this encounter: 62.8 kg.   Code status:   Code Status: Prior   DVT Prophylaxis: heparin injection 5,000 Units Start: 10/28/23 1400   Family Communication: None at bedside   Disposition Plan: Status is: Inpatient Remains inpatient appropriate because: Severity of illness   Planned Discharge Destination:Home   Diet: Diet Order             Diet renal with fluid restriction Fluid restriction: 1200 mL Fluid; Room service appropriate? Yes; Fluid consistency: Thin  Diet effective now                     Antimicrobial agents: Anti-infectives (From admission, onward)    Start     Dose/Rate Route Frequency Ordered Stop   10/29/23 2200  cefdinir (OMNICEF) capsule 300 mg  Status:  Discontinued       Note to Pharmacy: Take 300 mg (  1 capsule) on Monday after dialysis then again take 300 mg (1 capsule) on Wednesday afternoon     300 mg Oral  Once 10/27/23 2234 10/28/23 1139   10/28/23 1800  ceFAZolin (ANCEF) IVPB 1 g/50 mL premix        1 g 100 mL/hr over 30 Minutes Intravenous Every 24 hours 10/28/23 1139     10/27/23 2125  vancomycin variable dose per unstable renal function (pharmacist dosing)  Status:  Discontinued         Does not apply See admin instructions 10/27/23 2125 10/28/23 1139   10/27/23 2115  vancomycin (VANCOREADY) IVPB 1500 mg/300 mL        1,500  mg 150 mL/hr over 120 Minutes Intravenous  Once 10/27/23 2110 10/27/23 2338        MEDICATIONS: Scheduled Meds:  aspirin EC  81 mg Oral Daily   Chlorhexidine Gluconate Cloth  6 each Topical Daily   heparin injection (subcutaneous)  5,000 Units Subcutaneous Q8H   midodrine  5 mg Oral TID WC   mirtazapine  30 mg Oral QHS   pantoprazole  40 mg Oral BID AC   senna-docusate  1 tablet Oral Daily   tamsulosin  0.4 mg Oral Daily   Continuous Infusions:   ceFAZolin (ANCEF) IV 1 g (10/28/23 1758)   PRN Meds:.acetaminophen, ALPRAZolam   I have personally reviewed following labs and imaging studies  LABORATORY DATA: CBC: Recent Labs  Lab 10/24/23 1419 10/25/23 0547 10/26/23 0458 10/27/23 1717 10/29/23 0354  WBC 20.2* 11.4* 9.1 7.5 7.0  NEUTROABS 19.0*  --   --  5.7  --   HGB 7.9* 7.9* 7.4* 8.0* 7.3*  HCT 24.7* 23.8* 22.6* 25.3* 22.8*  MCV 94.6 93.0 93.0 96.6 93.4  PLT 145* 129* 131* 150 141*    Basic Metabolic Panel: Recent Labs  Lab 10/24/23 1419 10/24/23 2137 10/25/23 0547 10/26/23 0458 10/27/23 1717 10/29/23 0354  NA 133*  --  133* 134* 134* 136  K 3.6  --  3.8 4.4 3.3* 3.5  CL 97*  --  98 97* 99 104  CO2 22  --  23 22 25 24   GLUCOSE 126*  --  103* 109* 117* 114*  BUN 21  --  32* 53* 25* 39*  CREATININE 3.51*  --  4.50* 5.45* 3.35* 4.40*  CALCIUM 7.9*  --  7.7* 7.9* 7.8* 7.3*  MG  --  1.6*  --   --   --   --   PHOS  --   --  4.7*  --   --   --     GFR: Estimated Creatinine Clearance: 13.3 mL/min (A) (by C-G formula based on SCr of 4.4 mg/dL (H)).  Liver Function Tests: Recent Labs  Lab 10/24/23 1419 10/25/23 0547 10/26/23 0458 10/27/23 1717  AST 41  --  37 33  ALT 16  --  13 13  ALKPHOS 72  --  58 69  BILITOT 0.9  --  0.6 0.5  PROT 5.3*  --  4.7* 5.2*  ALBUMIN 2.8* 2.4* 2.2* 2.6*   No results for input(s): "LIPASE", "AMYLASE" in the last 168 hours. No results for input(s): "AMMONIA" in the last 168 hours.  Coagulation Profile: Recent Labs   Lab 10/24/23 1419  INR 1.1    Cardiac Enzymes: No results for input(s): "CKTOTAL", "CKMB", "CKMBINDEX", "TROPONINI" in the last 168 hours.  BNP (last 3 results) No results for input(s): "PROBNP" in the last 8760 hours.  Lipid Profile: No  results for input(s): "CHOL", "HDL", "LDLCALC", "TRIG", "CHOLHDL", "LDLDIRECT" in the last 72 hours.  Thyroid Function Tests: No results for input(s): "TSH", "T4TOTAL", "FREET4", "T3FREE", "THYROIDAB" in the last 72 hours.  Anemia Panel: No results for input(s): "VITAMINB12", "FOLATE", "FERRITIN", "TIBC", "IRON", "RETICCTPCT" in the last 72 hours.  Urine analysis:    Component Value Date/Time   COLORURINE YELLOW 10/25/2023 0605   APPEARANCEUR CLEAR 10/25/2023 0605   LABSPEC 1.012 10/25/2023 0605   PHURINE 7.0 10/25/2023 0605   GLUCOSEU NEGATIVE 10/25/2023 0605   HGBUR SMALL (A) 10/25/2023 0605   BILIRUBINUR NEGATIVE 10/25/2023 0605   KETONESUR NEGATIVE 10/25/2023 0605   PROTEINUR >=300 (A) 10/25/2023 0605   UROBILINOGEN 0.2 06/26/2007 1023   NITRITE NEGATIVE 10/25/2023 0605   LEUKOCYTESUR NEGATIVE 10/25/2023 0605    Sepsis Labs: Lactic Acid, Venous    Component Value Date/Time   LATICACIDVEN 1.2 10/27/2023 2054    MICROBIOLOGY: Recent Results (from the past 240 hour(s))  Culture, blood (Routine x 2)     Status: None   Collection Time: 10/24/23  2:19 PM   Specimen: BLOOD  Result Value Ref Range Status   Specimen Description BLOOD SITE NOT SPECIFIED  Final   Special Requests   Final    BOTTLES DRAWN AEROBIC AND ANAEROBIC Blood Culture adequate volume   Culture   Final    NO GROWTH 5 DAYS Performed at [facility], [address] [place], [place] [address]    Report Status 10/29/2023 FINAL  Final  Culture, blood (Routine x 2)     Status: None   Collection Time: 10/24/23  2:32 PM   Specimen: BLOOD RIGHT ARM  Result Value Ref Range Status   Specimen Description BLOOD RIGHT ARM  Final   Special Requests   Final     BOTTLES DRAWN AEROBIC AND ANAEROBIC Blood Culture results may not be optimal due to an excessive volume of blood received in culture bottles   Culture   Final    NO GROWTH 5 DAYS Performed at [facility], [address], [place], [place] [address]    Report Status 10/29/2023 FINAL  Final  Resp panel by RT-PCR (RSV, Flu A&B, Covid) Peripheral     Status: None   Collection Time: 10/24/23  2:41 PM   Specimen: Peripheral; Nasal Swab  Result Value Ref Range Status   SARS Coronavirus 2 by RT PCR NEGATIVE NEGATIVE Final   Influenza A by PCR NEGATIVE NEGATIVE Final   Influenza B by PCR NEGATIVE NEGATIVE Final    Comment: (NOTE) The Xpert Xpress SARS-CoV-2/FLU/RSV plus assay is intended as an aid in the diagnosis of influenza from Nasopharyngeal swab specimens and should not be used as a sole basis for treatment. Nasal washings and aspirates are unacceptable for Xpert Xpress SARS-CoV-2/FLU/RSV testing.  Fact Sheet for Patients: BloggerCourse.com  Fact Sheet for Healthcare Providers: SeriousBroker.it  This test is not yet approved or cleared by the [place] FDA and has been authorized for detection and/or diagnosis of SARS-CoV-2 by FDA under an Emergency Use Authorization (EUA). This EUA will remain in effect (meaning this test can be used) for the duration of the COVID-19 declaration under Section 564(b)(1) of the Act, 21 U.S.C. section 360bbb-3(b)(1), unless the authorization is terminated or revoked.     Resp Syncytial Virus by PCR NEGATIVE NEGATIVE Final    Comment: (NOTE) Fact Sheet for Patients: BloggerCourse.com  Fact Sheet for Healthcare Providers: SeriousBroker.it  This test is not yet approved or cleared by  the [employer] and has been authorized for detection and/or diagnosis of SARS-CoV-2 by FDA under an Emergency Use Authorization (EUA). This EUA will  remain in effect (meaning this test can be used) for the duration of the COVID-19 declaration under Section 564(b)(1) of the Act, 21 U.S.C. section 360bbb-3(b)(1), unless the authorization is terminated or revoked.  Performed at [facility], [address], [place], [place] [address]   Respiratory (~20 pathogens) panel by PCR     Status: None   Collection Time: 10/25/23  8:09 AM   Specimen: Nasopharyngeal Swab; Respiratory  Result Value Ref Range Status   Adenovirus NOT DETECTED NOT DETECTED Final   Coronavirus 229E NOT DETECTED NOT DETECTED Final    Comment: (NOTE) The Coronavirus on the Respiratory Panel, DOES NOT test for the novel  Coronavirus (2019 nCoV)    Coronavirus HKU1 NOT DETECTED NOT DETECTED Final   Coronavirus NL63 NOT DETECTED NOT DETECTED Final   Coronavirus OC43 NOT DETECTED NOT DETECTED Final   Metapneumovirus NOT DETECTED NOT DETECTED Final   Rhinovirus / Enterovirus NOT DETECTED NOT DETECTED Final   Influenza A NOT DETECTED NOT DETECTED Final   Influenza B NOT DETECTED NOT DETECTED Final   Parainfluenza Virus 1 NOT DETECTED NOT DETECTED Final   Parainfluenza Virus 2 NOT DETECTED NOT DETECTED Final   Parainfluenza Virus 3 NOT DETECTED NOT DETECTED Final   Parainfluenza Virus 4 NOT DETECTED NOT DETECTED Final   Respiratory Syncytial Virus NOT DETECTED NOT DETECTED Final   Bordetella pertussis NOT DETECTED NOT DETECTED Final   Bordetella Parapertussis NOT DETECTED NOT DETECTED Final   Chlamydophila pneumoniae NOT DETECTED NOT DETECTED Final   Mycoplasma pneumoniae NOT DETECTED NOT DETECTED Final    Comment: Performed at [facility], [address] [place], [place] [address]  Blood culture (routine x 2)     Status: None (Preliminary result)   Collection Time: 10/27/23  5:22 PM   Specimen: BLOOD  Result Value Ref Range Status   Specimen Description BLOOD SITE NOT SPECIFIED  Final   Special Requests   Final    BOTTLES DRAWN AEROBIC AND ANAEROBIC  Blood Culture results may not be optimal due to an inadequate volume of blood received in culture bottles   Culture   Final    NO GROWTH 2 DAYS Performed at [facility], [address] [place], [place] [address]    Report Status PENDING  Incomplete  Blood culture (routine x 2)     Status: None (Preliminary result)   Collection Time: 10/27/23  5:28 PM   Specimen: BLOOD LEFT HAND  Result Value Ref Range Status   Specimen Description BLOOD LEFT HAND  Final   Special Requests   Final    BOTTLES DRAWN AEROBIC AND ANAEROBIC Blood Culture adequate volume   Culture   Final    NO GROWTH 2 DAYS Performed at [facility], [address], [place], [place] [address]    Report Status PENDING  Incomplete    RADIOLOGY STUDIES/RESULTS: ECHOCARDIOGRAM COMPLETE  Result Date: 10/29/2023    ECHOCARDIOGRAM REPORT   Patient Name:   CORKY BLUMSTEIN Date of Exam: 10/29/2023 Medical Rec #:  [id number]    Height:       70.0 in Accession #:    [id number]   Weight:       138.4 lb Date of Birth:  06/15/1951     BSA:  1.785 m Patient Age:    72 years     BP:           160/85 mmHg Patient Gender: M            HR:           67 bpm. Exam Location:  Inpatient Procedure: 2D Echo, Cardiac Doppler, Color Doppler and Strain Analysis Indications:    Bacteremia  History:        Patient has prior history of Echocardiogram examinations, most                 recent 07/30/2022. CHF, Stroke; Risk Factors:Hypertension and                 Current Smoker.  Sonographer:    Karma Ganja Referring Phys: [id number] DAWOOD Teena Irani  Sonographer Comments: Global longitudinal strain was attempted. IMPRESSIONS  1. Left ventricular ejection fraction, by estimation, is 50 to 55%. The left ventricle has low normal function. The left ventricle has no regional wall motion abnormalities. There is mild left ventricular hypertrophy. Left ventricular diastolic parameters are consistent with Grade III diastolic dysfunction (restrictive). Elevated  left atrial pressure.  2. Right ventricular systolic function is normal. The right ventricular size is normal. There is mildly elevated pulmonary artery systolic pressure. The estimated right ventricular systolic pressure is 42.3 mmHg.  3. Left atrial size was moderately dilated.  4. The mitral valve is normal in structure. Trivial mitral valve regurgitation. No evidence of mitral stenosis.  5. The aortic valve was not well visualized. Aortic valve regurgitation is not visualized. Aortic valve sclerosis/calcification is present, without any evidence of aortic stenosis.  6. The inferior vena cava is dilated in size with >50% respiratory variability, suggesting right atrial pressure of 8 mmHg. Conclusion(s)/Recommendation(s): No clear vegetation seen, but technically difficult study. Consider a transesophageal echocardiogram to exclude infective endocarditis if clinically indicated. FINDINGS  Left Ventricle: Left ventricular ejection fraction, by estimation, is 50 to 55%. The left ventricle has low normal function. The left ventricle has no regional wall motion abnormalities. The left ventricular internal cavity size was normal in size. There is mild left ventricular hypertrophy. Left ventricular diastolic parameters are consistent with Grade III diastolic dysfunction (restrictive). Elevated left atrial pressure. Right Ventricle: The right ventricular size is normal. No increase in right ventricular wall thickness. Right ventricular systolic function is normal. There is mildly elevated pulmonary artery systolic pressure. The tricuspid regurgitant velocity is 2.93  m/s, and with an assumed right atrial pressure of 8 mmHg, the estimated right ventricular systolic pressure is 42.3 mmHg. Left Atrium: Left atrial size was moderately dilated. Right Atrium: Right atrial size was normal in size. Pericardium: There is no evidence of pericardial effusion. Mitral Valve: The mitral valve is normal in structure. Trivial mitral  valve regurgitation. No evidence of mitral valve stenosis. Tricuspid Valve: The tricuspid valve is normal in structure. Tricuspid valve regurgitation is trivial. Aortic Valve: The aortic valve was not well visualized. Aortic valve regurgitation is not visualized. Aortic valve sclerosis/calcification is present, without any evidence of aortic stenosis. Aortic valve mean gradient measures 6.0 mmHg. Aortic valve peak gradient measures 14.6 mmHg. Aortic valve area, by VTI measures 0.90 cm. Pulmonic Valve: The pulmonic valve was not well visualized. Pulmonic valve regurgitation is not visualized. Aorta: The aortic root is normal in size and structure. Venous: The inferior vena cava is dilated in size with greater than 50% respiratory variability, suggesting right atrial pressure of 8 mmHg. IAS/Shunts:  The interatrial septum was not well visualized.  LEFT VENTRICLE PLAX 2D LVIDd:         5.60 cm      Diastology LVIDs:         4.40 cm      LV e' medial:    4.79 cm/s LV PW:         1.10 cm      LV E/e' medial:  17.7 LV IVS:        1.60 cm      LV e' lateral:   7.51 cm/s LVOT diam:     2.00 cm      LV E/e' lateral: 11.3 LV SV:         32 LV SV Index:   18 LVOT Area:     3.14 cm  LV Volumes (MOD) LV vol d, MOD A2C: 108.0 ml LV vol d, MOD A4C: 118.0 ml LV vol s, MOD A2C: 59.1 ml LV vol s, MOD A4C: 58.1 ml LV SV MOD A2C:     48.9 ml LV SV MOD A4C:     118.0 ml LV SV MOD BP:      53.5 ml RIGHT VENTRICLE             IVC RV Basal diam:  3.80 cm     IVC diam: 2.10 cm RV S prime:     12.30 cm/s TAPSE (M-mode): 2.1 cm LEFT ATRIUM             Index        RIGHT ATRIUM           Index LA diam:        4.30 cm 2.41 cm/m   RA Area:     17.30 cm LA Vol (A2C):   95.8 ml 53.66 ml/m  RA Volume:   49.10 ml  27.50 ml/m LA Vol (A4C):   65.3 ml 36.58 ml/m LA Biplane Vol: 86.5 ml 48.45 ml/m  AORTIC VALVE AV Area (Vmax):    1.13 cm AV Area (Vmean):   1.34 cm AV Area (VTI):     0.90 cm AV Vmax:           191.00 cm/s AV Vmean:           115.000 cm/s AV VTI:            0.357 m AV Peak Grad:      14.6 mmHg AV Mean Grad:      6.0 mmHg LVOT Vmax:         68.50 cm/s LVOT Vmean:        49.000 cm/s LVOT VTI:          0.102 m LVOT/AV VTI ratio: 0.29  AORTA Ao Root diam: 3.70 cm MITRAL VALVE               TRICUSPID VALVE MV Area (PHT): 4.54 cm    TR Peak grad:   34.3 mmHg MV Decel Time: 167 msec    TR Vmax:        293.00 cm/s MV E velocity: 84.80 cm/s MV A velocity: 24.90 cm/s  SHUNTS MV E/A ratio:  3.41        Systemic VTI:  0.10 m                            Systemic Diam: 2.00 cm Epifanio Lesches MD Electronically signed by Epifanio Lesches MD Signature Date/Time: 10/29/2023/10:24:48 AM  Final    IR Removal Tun Cv Cath W/O FL  Result Date: 10/28/2023 INDICATION: Patient with a history of ESRD on hemodialysis via right IJ tunneled dialysis catheter. Catheter last exchanged 05/02/22. Patient now admitted with bacteremia that is likely catheter related. IR requested to removed catheter for a line holiday. EXAM: REMOVAL TUNNELED CENTRAL VENOUS CATHETER MEDICATIONS: 1% lidocaine 20 mL ANESTHESIA/SEDATION: none FLUOROSCOPY: none COMPLICATIONS: None immediate. PROCEDURE: Informed written consent was obtained from the patient after a thorough discussion of the procedural risks, benefits and alternatives. All questions were addressed. Maximal Sterile Barrier Technique was utilized including caps, mask, sterile gowns, sterile gloves, sterile drape, hand hygiene and skin antiseptic. A timeout was performed prior to the initiation of the procedure. The patient's right chest and catheter was prepped and draped in a normal sterile fashion. Heparin was removed from both ports of catheter. 1% lidocaine was used for local anesthesia. Using gentle blunt dissection and moderate manual traction the cuff of the catheter was exposed and the catheter was removed in it's entirety. The catheter had developed a fibrin sheath which was pulled out of the skin when the  catheter was removed. The fibrin sheath was removed with scissors. Pressure was held till hemostasis was obtained. A sterile dressing was applied. The patient tolerated the procedure well with no immediate complications. IMPRESSION: Successful catheter removal as described above. Procedure performed by Alwyn Ren NP Electronically Signed   By: Gilmer Mor D.O.   On: 10/28/2023 14:45   DG Chest 2 View  Result Date: 10/27/2023 CLINICAL DATA:  Weakness and cough. EXAM: CHEST - 2 VIEW COMPARISON:  Chest radiograph dated 10/24/2023. FINDINGS: Dialysis catheter in similar position. No focal consolidation, pleural effusion, or pneumothorax. The cardiac silhouette is within normal limits. No acute osseous pathology. IMPRESSION: No active cardiopulmonary disease. Electronically Signed   By: Elgie Collard M.D.   On: 10/27/2023 19:37     LOS: 2 days   Jeoffrey Massed, MD  [facility]    To contact the attending provider between 7A-7P or the covering provider during after hours 7P-7A, please log into the web site [email or web address] and access using universal [place] password for that web site. If you do not have the password, please call the hospital operator.  10/29/2023, 10:44 AM

## 2023-10-29 NOTE — Progress Notes (Signed)
    [facility] has been requested to perform a transesophageal echocardiogram on Caleb Simmons for evaluation of possible endocarditis with blood cultures positive for MSSA.  After careful review of history and examination, the risks and benefits of transesophageal echocardiogram have been explained including risks of esophageal damage, perforation (1:10,000 risk), bleeding, pharyngeal hematoma as well as other potential complications associated with conscious sedation including aspiration, arrhythmia, respiratory failure and death. Alternatives to treatment were discussed, questions were answered. Patient is willing to proceed.   Perlie Gold PA-C 10/29/2023 4:54 PM

## 2023-10-29 NOTE — Progress Notes (Signed)
[facility] Progress Note   Subjective:   Pt seen in room, TDC was removed yesterday. Reports his shoulders are a little achy, otherwise feeling ok. Denies SOB, CP, dizziness, nausea.   Objective Vitals:   10/28/23 0855 10/28/23 2000 10/29/23 0000 10/29/23 0427  BP:  (!) 159/95 (!) 166/79 (!) 160/85  Pulse:    67  Resp:  18 16 18   Temp:  98.4 F (36.9 C) 97.9 F (36.6 C) 98.1 F (36.7 C)  TempSrc:    Oral  SpO2:      Weight: 62.8 kg     Height: 5\' 10"  (1.778 m)      Physical Exam General: Alert male in NAD Heart: RRR, no murmurs, rubs or gallops Lungs: CTA bilaterally, respirations unlabored on RA Abdomen: Soft, non-distended, +BS Extremities: No edema b/l lower extremities Dialysis Access:  Dry bandage over prior [facility] site  Additional Objective Labs: Basic Metabolic Panel: Recent Labs  Lab 10/25/23 0547 10/26/23 0458 10/27/23 1717 10/29/23 0354  NA 133* 134* 134* 136  K 3.8 4.4 3.3* 3.5  CL 98 97* 99 104  CO2 23 22 25 24   GLUCOSE 103* 109* 117* 114*  BUN 32* 53* 25* 39*  CREATININE 4.50* 5.45* 3.35* 4.40*  CALCIUM 7.7* 7.9* 7.8* 7.3*  PHOS 4.7*  --   --   --    Liver Function Tests: Recent Labs  Lab 10/24/23 1419 10/25/23 0547 10/26/23 0458 10/27/23 1717  AST 41  --  37 33  ALT 16  --  13 13  ALKPHOS 72  --  58 69  BILITOT 0.9  --  0.6 0.5  PROT 5.3*  --  4.7* 5.2*  ALBUMIN 2.8* 2.4* 2.2* 2.6*   No results for input(s): "LIPASE", "AMYLASE" in the last 168 hours. CBC: Recent Labs  Lab 10/24/23 1419 10/25/23 0547 10/26/23 0458 10/27/23 1717 10/29/23 0354  WBC 20.2* 11.4* 9.1 7.5 7.0  NEUTROABS 19.0*  --   --  5.7  --   HGB 7.9* 7.9* 7.4* 8.0* 7.3*  HCT 24.7* 23.8* 22.6* 25.3* 22.8*  MCV 94.6 93.0 93.0 96.6 93.4  PLT 145* 129* 131* 150 141*   Blood Culture    Component Value Date/Time   SDES BLOOD LEFT HAND 10/27/2023 1728   SPECREQUEST  10/27/2023 1728    BOTTLES DRAWN AEROBIC AND ANAEROBIC Blood Culture adequate volume    CULT  10/27/2023 1728    NO GROWTH 2 DAYS Performed at [facility], [address], [place], [place] [address]    REPTSTATUS PENDING 10/27/2023 1728    Cardiac Enzymes: No results for input(s): "CKTOTAL", "CKMB", "CKMBINDEX", "TROPONINI" in the last 168 hours. CBG: No results for input(s): "GLUCAP" in the last 168 hours. Iron Studies: No results for input(s): "IRON", "TIBC", "TRANSFERRIN", "FERRITIN" in the last 72 hours. @lablastinr3 @ Studies/Results: IR Removal Tun Cv Cath W/O FL  Result Date: 10/28/2023 INDICATION: Patient with a history of ESRD on hemodialysis via right IJ tunneled dialysis catheter. Catheter last exchanged 05/02/22. Patient now admitted with bacteremia that is likely catheter related. IR requested to removed catheter for a line holiday. EXAM: REMOVAL TUNNELED CENTRAL VENOUS CATHETER MEDICATIONS: 1% lidocaine 20 mL ANESTHESIA/SEDATION: none FLUOROSCOPY: none COMPLICATIONS: None immediate. PROCEDURE: Informed written consent was obtained from the patient after a thorough discussion of the procedural risks, benefits and alternatives. All questions were addressed. Maximal Sterile Barrier Technique was utilized including caps, mask, sterile gowns, sterile gloves, sterile drape, hand hygiene and skin antiseptic. A timeout  was performed prior to the initiation of the procedure. The patient's right chest and catheter was prepped and draped in a normal sterile fashion. Heparin was removed from both ports of catheter. 1% lidocaine was used for local anesthesia. Using gentle blunt dissection and moderate manual traction the cuff of the catheter was exposed and the catheter was removed in it's entirety. The catheter had developed a fibrin sheath which was pulled out of the skin when the catheter was removed. The fibrin sheath was removed with scissors. Pressure was held till hemostasis was obtained. A sterile dressing was applied. The patient tolerated the procedure well with no  immediate complications. IMPRESSION: Successful catheter removal as described above. Procedure performed by Alwyn Ren NP Electronically Signed   By: Gilmer Mor D.O.   On: 10/28/2023 14:45   DG Chest 2 View  Result Date: 10/27/2023 CLINICAL DATA:  Weakness and cough. EXAM: CHEST - 2 VIEW COMPARISON:  Chest radiograph dated 10/24/2023. FINDINGS: Dialysis catheter in similar position. No focal consolidation, pleural effusion, or pneumothorax. The cardiac silhouette is within normal limits. No acute osseous pathology. IMPRESSION: No active cardiopulmonary disease. Electronically Signed   By: Elgie Collard M.D.   On: 10/27/2023 19:37   Medications:   ceFAZolin (ANCEF) IV 1 g (10/28/23 1758)    aspirin EC  81 mg Oral Daily   Chlorhexidine Gluconate Cloth  6 each Topical Daily   heparin injection (subcutaneous)  5,000 Units Subcutaneous Q8H   midodrine  5 mg Oral TID WC   mirtazapine  30 mg Oral QHS   pantoprazole  40 mg Oral BID AC   senna-docusate  1 tablet Oral Daily   tamsulosin  0.4 mg Oral Daily    Dialysis Orders:  Center: [facility]  on Mon/Fri. 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 On vancomycin. TDC removed 10/28/23 for line holiday. Next HD is not until Friday so will plan to replace TDC at that time.  ESRD:  On HD on Mon/Fri, no acute indications for HD today. Next HD Friday.   Hypertension/volume: BP is elevated. Appears he is on midodrine TID, holding for now.   Anemia: Hgb 7.3. 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 Reportedly had a positive FOBT last admission, monitor Hgb and transfuse PRN.   Metabolic bone disease: Corrected calcium at goal. Phos at goal. Continue VDRA  Nutrition:  Poor PO intake due to hospital visits, appetite is good. Continue renal diet.  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 Reports stable so far.      Rogers Blocker, PA-C 10/29/2023, 9:42 AM  [facility] Pager: [phone]

## 2023-10-29 NOTE — Plan of Care (Signed)
  Problem: Elimination: Goal: Will not experience complications related to bowel motility Outcome: Progressing   Problem: Safety: Goal: Ability to remain free from injury will improve Outcome: Progressing   Problem: Skin Integrity: Goal: Risk for impaired skin integrity will decrease Outcome: Progressing   

## 2023-10-29 NOTE — Progress Notes (Signed)
PROGRESS NOTE        PATIENT DETAILS Name: Caleb Simmons Age: 72 y.o. Sex: male Date of Birth: March 16, 1951 Admit Date: 10/27/2023 Admitting Physician Buena Irish, MD ZOX:WRUE, Terrace Arabia, MD  Brief Summary: Patient is a 72 y.o.  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  Significant events: 11/4>> admit to [facility]-outpatient blood cultures positive for MSSA  Significant studies: 11/4>> chest x-ray: No pneumonia 11/6>> echo: EF 50-55%, grade 3 diastolic dysfunction.  Significant microbiology data: 11/4>> blood culture: No growth  Procedures: None  Consults: Nephrology ID  Subjective: Lying comfortably in bed-denies any chest pain or shortness of breath.  Objective: Vitals: Blood pressure (!) 160/85, pulse 67, temperature 98.1 F (36.7 C), temperature source Oral, resp. rate 18, height 5\' 10"  (1.778 m), weight 62.8 kg, SpO2 98%.   Exam: Gen Exam:Alert awake-not in any distress HEENT:atraumatic, normocephalic Chest: B/L clear to auscultation anteriorly CVS:S1S2 regular Abdomen:soft non tender, non distended Extremities:no edema Neurology: Non focal Skin: no rash  Pertinent Labs/Radiology:    Latest Ref Rng & Units 10/29/2023    3:54 AM 10/27/2023    5:17 PM 10/26/2023    4:58 AM  CBC  WBC 4.0 - 10.5 K/uL 7.0  7.5  9.1   Hemoglobin 13.0 - 17.0 g/dL 7.3  8.0  7.4   Hematocrit 39.0 - 52.0 % 22.8  25.3  22.6   Platelets 150 - 400 K/uL 141  150  131     Lab Results  Component Value Date   NA 136 10/29/2023   K 3.5 10/29/2023   CL 104 10/29/2023   CO2 24 10/29/2023      Assessment/Plan: MSSA bacteremia Likely associated with HD catheter-which was removed 11/5 by IR Line holiday in progress Repeat cultures negative so far Echo without any obvious vegetations On Ancef Await further recommendations from infectious  disease  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   Weight as of this encounter: 62.8 kg.   Code status:   Code Status: Prior   DVT Prophylaxis: heparin injection 5,000 Units Start: 10/28/23 1400   Family Communication: None at bedside   Disposition Plan: Status is: Inpatient Remains inpatient appropriate because: Severity of illness   Planned Discharge Destination:Home   Diet: Diet Order             Diet renal with fluid restriction Fluid restriction: 1200 mL Fluid; Room service appropriate? Yes; Fluid consistency: Thin  Diet effective now                     Antimicrobial agents: Anti-infectives (From admission, onward)    Start     Dose/Rate Route Frequency Ordered Stop   10/29/23 2200  cefdinir (OMNICEF) capsule 300 mg  Status:  Discontinued       Note to Pharmacy: Take 300 mg (  1 capsule) on Monday after dialysis then again take 300 mg (1 capsule) on Wednesday afternoon     300 mg Oral  Once 10/27/23 2234 10/28/23 1139   10/28/23 1800  ceFAZolin (ANCEF) IVPB 1 g/50 mL premix        1 g 100 mL/hr over 30 Minutes Intravenous Every 24 hours 10/28/23 1139     10/27/23 2125  vancomycin variable dose per unstable renal function (pharmacist dosing)  Status:  Discontinued         Does not apply See admin instructions 10/27/23 2125 10/28/23 1139   10/27/23 2115  vancomycin (VANCOREADY) IVPB 1500 mg/300 mL        1,500  mg 150 mL/hr over 120 Minutes Intravenous  Once 10/27/23 2110 10/27/23 2338        MEDICATIONS: Scheduled Meds:  aspirin EC  81 mg Oral Daily   Chlorhexidine Gluconate Cloth  6 each Topical Daily   heparin injection (subcutaneous)  5,000 Units Subcutaneous Q8H   midodrine  5 mg Oral TID WC   mirtazapine  30 mg Oral QHS   pantoprazole  40 mg Oral BID AC   senna-docusate  1 tablet Oral Daily   tamsulosin  0.4 mg Oral Daily   Continuous Infusions:   ceFAZolin (ANCEF) IV 1 g (10/28/23 1758)   PRN Meds:.acetaminophen, ALPRAZolam   I have personally reviewed following labs and imaging studies  LABORATORY DATA: CBC: Recent Labs  Lab 10/24/23 1419 10/25/23 0547 10/26/23 0458 10/27/23 1717 10/29/23 0354  WBC 20.2* 11.4* 9.1 7.5 7.0  NEUTROABS 19.0*  --   --  5.7  --   HGB 7.9* 7.9* 7.4* 8.0* 7.3*  HCT 24.7* 23.8* 22.6* 25.3* 22.8*  MCV 94.6 93.0 93.0 96.6 93.4  PLT 145* 129* 131* 150 141*    Basic Metabolic Panel: Recent Labs  Lab 10/24/23 1419 10/24/23 2137 10/25/23 0547 10/26/23 0458 10/27/23 1717 10/29/23 0354  NA 133*  --  133* 134* 134* 136  K 3.6  --  3.8 4.4 3.3* 3.5  CL 97*  --  98 97* 99 104  CO2 22  --  23 22 25 24   GLUCOSE 126*  --  103* 109* 117* 114*  BUN 21  --  32* 53* 25* 39*  CREATININE 3.51*  --  4.50* 5.45* 3.35* 4.40*  CALCIUM 7.9*  --  7.7* 7.9* 7.8* 7.3*  MG  --  1.6*  --   --   --   --   PHOS  --   --  4.7*  --   --   --     GFR: Estimated Creatinine Clearance: 13.3 mL/min (A) (by C-G formula based on SCr of 4.4 mg/dL (H)).  Liver Function Tests: Recent Labs  Lab 10/24/23 1419 10/25/23 0547 10/26/23 0458 10/27/23 1717  AST 41  --  37 33  ALT 16  --  13 13  ALKPHOS 72  --  58 69  BILITOT 0.9  --  0.6 0.5  PROT 5.3*  --  4.7* 5.2*  ALBUMIN 2.8* 2.4* 2.2* 2.6*   No results for input(s): "LIPASE", "AMYLASE" in the last 168 hours. No results for input(s): "AMMONIA" in the last 168 hours.  Coagulation Profile: Recent Labs   Lab 10/24/23 1419  INR 1.1    Cardiac Enzymes: No results for input(s): "CKTOTAL", "CKMB", "CKMBINDEX", "TROPONINI" in the last 168 hours.  BNP (last 3 results) No results for input(s): "PROBNP" in the last 8760 hours.  Lipid Profile: No  results for input(s): "CHOL", "HDL", "LDLCALC", "TRIG", "CHOLHDL", "LDLDIRECT" in the last 72 hours.  Thyroid Function Tests: No results for input(s): "TSH", "T4TOTAL", "FREET4", "T3FREE", "THYROIDAB" in the last 72 hours.  Anemia Panel: No results for input(s): "VITAMINB12", "FOLATE", "FERRITIN", "TIBC", "IRON", "RETICCTPCT" in the last 72 hours.  Urine analysis:    Component Value Date/Time   COLORURINE YELLOW 10/25/2023 0605   APPEARANCEUR CLEAR 10/25/2023 0605   LABSPEC 1.012 10/25/2023 0605   PHURINE 7.0 10/25/2023 0605   GLUCOSEU NEGATIVE 10/25/2023 0605   HGBUR SMALL (A) 10/25/2023 0605   BILIRUBINUR NEGATIVE 10/25/2023 0605   KETONESUR NEGATIVE 10/25/2023 0605   PROTEINUR >=300 (A) 10/25/2023 0605   UROBILINOGEN 0.2 06/26/2007 1023   NITRITE NEGATIVE 10/25/2023 0605   LEUKOCYTESUR NEGATIVE 10/25/2023 0605    Sepsis Labs: Lactic Acid, Venous    Component Value Date/Time   LATICACIDVEN 1.2 10/27/2023 2054    MICROBIOLOGY: Recent Results (from the past 240 hour(s))  Culture, blood (Routine x 2)     Status: None   Collection Time: 10/24/23  2:19 PM   Specimen: BLOOD  Result Value Ref Range Status   Specimen Description BLOOD SITE NOT SPECIFIED  Final   Special Requests   Final    BOTTLES DRAWN AEROBIC AND ANAEROBIC Blood Culture adequate volume   Culture   Final    NO GROWTH 5 DAYS Performed at [facility], [address] [place], [place] [address]    Report Status 10/29/2023 FINAL  Final  Culture, blood (Routine x 2)     Status: None   Collection Time: 10/24/23  2:32 PM   Specimen: BLOOD RIGHT ARM  Result Value Ref Range Status   Specimen Description BLOOD RIGHT ARM  Final   Special Requests   Final     BOTTLES DRAWN AEROBIC AND ANAEROBIC Blood Culture results may not be optimal due to an excessive volume of blood received in culture bottles   Culture   Final    NO GROWTH 5 DAYS Performed at [facility], [address], [place], [place] [address]    Report Status 10/29/2023 FINAL  Final  Resp panel by RT-PCR (RSV, Flu A&B, Covid) Peripheral     Status: None   Collection Time: 10/24/23  2:41 PM   Specimen: Peripheral; Nasal Swab  Result Value Ref Range Status   SARS Coronavirus 2 by RT PCR NEGATIVE NEGATIVE Final   Influenza A by PCR NEGATIVE NEGATIVE Final   Influenza B by PCR NEGATIVE NEGATIVE Final    Comment: (NOTE) The Xpert Xpress SARS-CoV-2/FLU/RSV plus assay is intended as an aid in the diagnosis of influenza from Nasopharyngeal swab specimens and should not be used as a sole basis for treatment. Nasal washings and aspirates are unacceptable for Xpert Xpress SARS-CoV-2/FLU/RSV testing.  Fact Sheet for Patients: BloggerCourse.com  Fact Sheet for Healthcare Providers: SeriousBroker.it  This test is not yet approved or cleared by the [place] FDA and has been authorized for detection and/or diagnosis of SARS-CoV-2 by FDA under an Emergency Use Authorization (EUA). This EUA will remain in effect (meaning this test can be used) for the duration of the COVID-19 declaration under Section 564(b)(1) of the Act, 21 U.S.C. section 360bbb-3(b)(1), unless the authorization is terminated or revoked.     Resp Syncytial Virus by PCR NEGATIVE NEGATIVE Final    Comment: (NOTE) Fact Sheet for Patients: BloggerCourse.com  Fact Sheet for Healthcare Providers: SeriousBroker.it  This test is not yet approved or cleared by  the [employer] and has been authorized for detection and/or diagnosis of SARS-CoV-2 by FDA under an Emergency Use Authorization (EUA). This EUA will  remain in effect (meaning this test can be used) for the duration of the COVID-19 declaration under Section 564(b)(1) of the Act, 21 U.S.C. section 360bbb-3(b)(1), unless the authorization is terminated or revoked.  Performed at [facility], [address], [place], [place] [address]   Respiratory (~20 pathogens) panel by PCR     Status: None   Collection Time: 10/25/23  8:09 AM   Specimen: Nasopharyngeal Swab; Respiratory  Result Value Ref Range Status   Adenovirus NOT DETECTED NOT DETECTED Final   Coronavirus 229E NOT DETECTED NOT DETECTED Final    Comment: (NOTE) The Coronavirus on the Respiratory Panel, DOES NOT test for the novel  Coronavirus (2019 nCoV)    Coronavirus HKU1 NOT DETECTED NOT DETECTED Final   Coronavirus NL63 NOT DETECTED NOT DETECTED Final   Coronavirus OC43 NOT DETECTED NOT DETECTED Final   Metapneumovirus NOT DETECTED NOT DETECTED Final   Rhinovirus / Enterovirus NOT DETECTED NOT DETECTED Final   Influenza A NOT DETECTED NOT DETECTED Final   Influenza B NOT DETECTED NOT DETECTED Final   Parainfluenza Virus 1 NOT DETECTED NOT DETECTED Final   Parainfluenza Virus 2 NOT DETECTED NOT DETECTED Final   Parainfluenza Virus 3 NOT DETECTED NOT DETECTED Final   Parainfluenza Virus 4 NOT DETECTED NOT DETECTED Final   Respiratory Syncytial Virus NOT DETECTED NOT DETECTED Final   Bordetella pertussis NOT DETECTED NOT DETECTED Final   Bordetella Parapertussis NOT DETECTED NOT DETECTED Final   Chlamydophila pneumoniae NOT DETECTED NOT DETECTED Final   Mycoplasma pneumoniae NOT DETECTED NOT DETECTED Final    Comment: Performed at [facility], [address] [place], [place] [address]  Blood culture (routine x 2)     Status: None (Preliminary result)   Collection Time: 10/27/23  5:22 PM   Specimen: BLOOD  Result Value Ref Range Status   Specimen Description BLOOD SITE NOT SPECIFIED  Final   Special Requests   Final    BOTTLES DRAWN AEROBIC AND ANAEROBIC  Blood Culture results may not be optimal due to an inadequate volume of blood received in culture bottles   Culture   Final    NO GROWTH 2 DAYS Performed at [facility], [address] [place], [place] [address]    Report Status PENDING  Incomplete  Blood culture (routine x 2)     Status: None (Preliminary result)   Collection Time: 10/27/23  5:28 PM   Specimen: BLOOD LEFT HAND  Result Value Ref Range Status   Specimen Description BLOOD LEFT HAND  Final   Special Requests   Final    BOTTLES DRAWN AEROBIC AND ANAEROBIC Blood Culture adequate volume   Culture   Final    NO GROWTH 2 DAYS Performed at [facility], [address], [place], [place] [address]    Report Status PENDING  Incomplete    RADIOLOGY STUDIES/RESULTS: ECHOCARDIOGRAM COMPLETE  Result Date: 10/29/2023    ECHOCARDIOGRAM REPORT   Patient Name:   Caleb Simmons Date of Exam: 10/29/2023 Medical Rec #:  [id number]    Height:       70.0 in Accession #:    [id number]   Weight:       138.4 lb Date of Birth:  06/15/1951     BSA:  1.785 m Patient Age:    72 years     BP:           160/85 mmHg Patient Gender: M            HR:           67 bpm. Exam Location:  Inpatient Procedure: 2D Echo, Cardiac Doppler, Color Doppler and Strain Analysis Indications:    Bacteremia  History:        Patient has prior history of Echocardiogram examinations, most                 recent 07/30/2022. CHF, Stroke; Risk Factors:Hypertension and                 Current Smoker.  Sonographer:    Karma Ganja Referring Phys: [id number] DAWOOD Teena Irani  Sonographer Comments: Global longitudinal strain was attempted. IMPRESSIONS  1. Left ventricular ejection fraction, by estimation, is 50 to 55%. The left ventricle has low normal function. The left ventricle has no regional wall motion abnormalities. There is mild left ventricular hypertrophy. Left ventricular diastolic parameters are consistent with Grade III diastolic dysfunction (restrictive). Elevated  left atrial pressure.  2. Right ventricular systolic function is normal. The right ventricular size is normal. There is mildly elevated pulmonary artery systolic pressure. The estimated right ventricular systolic pressure is 42.3 mmHg.  3. Left atrial size was moderately dilated.  4. The mitral valve is normal in structure. Trivial mitral valve regurgitation. No evidence of mitral stenosis.  5. The aortic valve was not well visualized. Aortic valve regurgitation is not visualized. Aortic valve sclerosis/calcification is present, without any evidence of aortic stenosis.  6. The inferior vena cava is dilated in size with >50% respiratory variability, suggesting right atrial pressure of 8 mmHg. Conclusion(s)/Recommendation(s): No clear vegetation seen, but technically difficult study. Consider a transesophageal echocardiogram to exclude infective endocarditis if clinically indicated. FINDINGS  Left Ventricle: Left ventricular ejection fraction, by estimation, is 50 to 55%. The left ventricle has low normal function. The left ventricle has no regional wall motion abnormalities. The left ventricular internal cavity size was normal in size. There is mild left ventricular hypertrophy. Left ventricular diastolic parameters are consistent with Grade III diastolic dysfunction (restrictive). Elevated left atrial pressure. Right Ventricle: The right ventricular size is normal. No increase in right ventricular wall thickness. Right ventricular systolic function is normal. There is mildly elevated pulmonary artery systolic pressure. The tricuspid regurgitant velocity is 2.93  m/s, and with an assumed right atrial pressure of 8 mmHg, the estimated right ventricular systolic pressure is 42.3 mmHg. Left Atrium: Left atrial size was moderately dilated. Right Atrium: Right atrial size was normal in size. Pericardium: There is no evidence of pericardial effusion. Mitral Valve: The mitral valve is normal in structure. Trivial mitral  valve regurgitation. No evidence of mitral valve stenosis. Tricuspid Valve: The tricuspid valve is normal in structure. Tricuspid valve regurgitation is trivial. Aortic Valve: The aortic valve was not well visualized. Aortic valve regurgitation is not visualized. Aortic valve sclerosis/calcification is present, without any evidence of aortic stenosis. Aortic valve mean gradient measures 6.0 mmHg. Aortic valve peak gradient measures 14.6 mmHg. Aortic valve area, by VTI measures 0.90 cm. Pulmonic Valve: The pulmonic valve was not well visualized. Pulmonic valve regurgitation is not visualized. Aorta: The aortic root is normal in size and structure. Venous: The inferior vena cava is dilated in size with greater than 50% respiratory variability, suggesting right atrial pressure of 8 mmHg. IAS/Shunts:  The interatrial septum was not well visualized.  LEFT VENTRICLE PLAX 2D LVIDd:         5.60 cm      Diastology LVIDs:         4.40 cm      LV e' medial:    4.79 cm/s LV PW:         1.10 cm      LV E/e' medial:  17.7 LV IVS:        1.60 cm      LV e' lateral:   7.51 cm/s LVOT diam:     2.00 cm      LV E/e' lateral: 11.3 LV SV:         32 LV SV Index:   18 LVOT Area:     3.14 cm  LV Volumes (MOD) LV vol d, MOD A2C: 108.0 ml LV vol d, MOD A4C: 118.0 ml LV vol s, MOD A2C: 59.1 ml LV vol s, MOD A4C: 58.1 ml LV SV MOD A2C:     48.9 ml LV SV MOD A4C:     118.0 ml LV SV MOD BP:      53.5 ml RIGHT VENTRICLE             IVC RV Basal diam:  3.80 cm     IVC diam: 2.10 cm RV S prime:     12.30 cm/s TAPSE (M-mode): 2.1 cm LEFT ATRIUM             Index        RIGHT ATRIUM           Index LA diam:        4.30 cm 2.41 cm/m   RA Area:     17.30 cm LA Vol (A2C):   95.8 ml 53.66 ml/m  RA Volume:   49.10 ml  27.50 ml/m LA Vol (A4C):   65.3 ml 36.58 ml/m LA Biplane Vol: 86.5 ml 48.45 ml/m  AORTIC VALVE AV Area (Vmax):    1.13 cm AV Area (Vmean):   1.34 cm AV Area (VTI):     0.90 cm AV Vmax:           191.00 cm/s AV Vmean:           115.000 cm/s AV VTI:            0.357 m AV Peak Grad:      14.6 mmHg AV Mean Grad:      6.0 mmHg LVOT Vmax:         68.50 cm/s LVOT Vmean:        49.000 cm/s LVOT VTI:          0.102 m LVOT/AV VTI ratio: 0.29  AORTA Ao Root diam: 3.70 cm MITRAL VALVE               TRICUSPID VALVE MV Area (PHT): 4.54 cm    TR Peak grad:   34.3 mmHg MV Decel Time: 167 msec    TR Vmax:        293.00 cm/s MV E velocity: 84.80 cm/s MV A velocity: 24.90 cm/s  SHUNTS MV E/A ratio:  3.41        Systemic VTI:  0.10 m                            Systemic Diam: 2.00 cm Epifanio Lesches MD Electronically signed by Epifanio Lesches MD Signature Date/Time: 10/29/2023/10:24:48 AM  Final    IR Removal Tun Cv Cath W/O FL  Result Date: 10/28/2023 INDICATION: Patient with a history of ESRD on hemodialysis via right IJ tunneled dialysis catheter. Catheter last exchanged 05/02/22. Patient now admitted with bacteremia that is likely catheter related. IR requested to removed catheter for a line holiday. EXAM: REMOVAL TUNNELED CENTRAL VENOUS CATHETER MEDICATIONS: 1% lidocaine 20 mL ANESTHESIA/SEDATION: none FLUOROSCOPY: none COMPLICATIONS: None immediate. PROCEDURE: Informed written consent was obtained from the patient after a thorough discussion of the procedural risks, benefits and alternatives. All questions were addressed. Maximal Sterile Barrier Technique was utilized including caps, mask, sterile gowns, sterile gloves, sterile drape, hand hygiene and skin antiseptic. A timeout was performed prior to the initiation of the procedure. The patient's right chest and catheter was prepped and draped in a normal sterile fashion. Heparin was removed from both ports of catheter. 1% lidocaine was used for local anesthesia. Using gentle blunt dissection and moderate manual traction the cuff of the catheter was exposed and the catheter was removed in it's entirety. The catheter had developed a fibrin sheath which was pulled out of the skin when the  catheter was removed. The fibrin sheath was removed with scissors. Pressure was held till hemostasis was obtained. A sterile dressing was applied. The patient tolerated the procedure well with no immediate complications. IMPRESSION: Successful catheter removal as described above. Procedure performed by Alwyn Ren NP Electronically Signed   By: Gilmer Mor D.O.   On: 10/28/2023 14:45   DG Chest 2 View  Result Date: 10/27/2023 CLINICAL DATA:  Weakness and cough. EXAM: CHEST - 2 VIEW COMPARISON:  Chest radiograph dated 10/24/2023. FINDINGS: Dialysis catheter in similar position. No focal consolidation, pleural effusion, or pneumothorax. The cardiac silhouette is within normal limits. No acute osseous pathology. IMPRESSION: No active cardiopulmonary disease. Electronically Signed   By: Elgie Collard M.D.   On: 10/27/2023 19:37     LOS: 2 days   Jeoffrey Massed, MD  [facility]    To contact the attending provider between 7A-7P or the covering provider during after hours 7P-7A, please log into the web site [email or web address] and access using universal [place] password for that web site. If you do not have the password, please call the hospital operator.  10/29/2023, 10:44 AM

## 2023-10-29 NOTE — Progress Notes (Signed)
Echocardiogram 2D Echocardiogram has been performed.  Caleb Simmons 10/29/2023, 8:45 AM

## 2023-10-30 ENCOUNTER — Encounter (HOSPITAL_COMMUNITY)
Admission: EM | Discharge status: Against Medical Advice | Payer: Self-pay | Source: Home / Self Care | Attending: Internal Medicine

## 2023-10-30 ENCOUNTER — Inpatient Hospital Stay (HOSPITAL_COMMUNITY): Payer: Medicare HMO | Admitting: Anesthesiology

## 2023-10-30 ENCOUNTER — Inpatient Hospital Stay (HOSPITAL_COMMUNITY): Payer: Medicare HMO

## 2023-10-30 DIAGNOSIS — T827XXA Infection and inflammatory reaction due to other cardiac and vascular devices, implants and grafts, initial encounter: Secondary | ICD-10-CM | POA: Diagnosis not present

## 2023-10-30 DIAGNOSIS — I340 Nonrheumatic mitral (valve) insufficiency: Secondary | ICD-10-CM

## 2023-10-30 DIAGNOSIS — N186 End stage renal disease: Secondary | ICD-10-CM | POA: Diagnosis not present

## 2023-10-30 DIAGNOSIS — R7881 Bacteremia: Secondary | ICD-10-CM

## 2023-10-30 DIAGNOSIS — D649 Anemia, unspecified: Secondary | ICD-10-CM | POA: Diagnosis not present

## 2023-10-30 HISTORY — PX: TRANSESOPHAGEAL ECHOCARDIOGRAM (CATH LAB): EP1270

## 2023-10-30 LAB — CBC
HCT: 26.6 % — ABNORMAL LOW (ref 39.0–52.0)
Hemoglobin: 8.5 g/dL — ABNORMAL LOW (ref 13.0–17.0)
MCH: 29.9 pg (ref 26.0–34.0)
MCHC: 32 g/dL (ref 30.0–36.0)
MCV: 93.7 fL (ref 80.0–100.0)
Platelets: 180 10*3/uL (ref 150–400)
RBC: 2.84 MIL/uL — ABNORMAL LOW (ref 4.22–5.81)
RDW: 14.8 % (ref 11.5–15.5)
WBC: 10.2 10*3/uL (ref 4.0–10.5)
nRBC: 0 % (ref 0.0–0.2)

## 2023-10-30 LAB — ECHO TEE: Area-P 1/2: 1.98 cm2

## 2023-10-30 LAB — HEPATITIS B SURFACE ANTIGEN: Hepatitis B Surface Ag: NONREACTIVE

## 2023-10-30 SURGERY — TRANSESOPHAGEAL ECHOCARDIOGRAM (TEE) (CATHLAB)
Anesthesia: Monitor Anesthesia Care

## 2023-10-30 MED ORDER — PROPOFOL 500 MG/50ML IV EMUL
INTRAVENOUS | Status: DC | PRN
  Administered 2023-10-30: 100 ug/kg/min via INTRAVENOUS

## 2023-10-30 MED ORDER — CHLORHEXIDINE GLUCONATE CLOTH 2 % EX PADS
6.0000 | MEDICATED_PAD | Freq: Every day | CUTANEOUS | Status: DC
  Administered 2023-10-30 – 2023-10-31 (×2): 6 via TOPICAL

## 2023-10-30 MED ORDER — HYDRALAZINE HCL 20 MG/ML IJ SOLN: 10.0000 mg | Freq: Once | INTRAMUSCULAR | Status: DC

## 2023-10-30 MED ORDER — HYDRALAZINE HCL 20 MG/ML IJ SOLN
INTRAMUSCULAR | Status: AC
  Administered 2023-10-30: 10 mg via INTRAVENOUS
  Filled 2023-10-30: qty 1

## 2023-10-30 MED ORDER — PROPOFOL 10 MG/ML IV BOLUS
INTRAVENOUS | Status: DC | PRN
  Administered 2023-10-30: 30 mg via INTRAVENOUS
  Administered 2023-10-30: 50 mg via INTRAVENOUS

## 2023-10-30 MED ORDER — LIDOCAINE VISCOUS HCL 2 % MT SOLN
OROMUCOSAL | Status: AC
  Filled 2023-10-30: qty 15

## 2023-10-30 MED ORDER — LIDOCAINE 2% (20 MG/ML) 5 ML SYRINGE
INTRAMUSCULAR | Status: DC | PRN
  Administered 2023-10-30: 100 mg via INTRAVENOUS

## 2023-10-30 MED ORDER — SODIUM CHLORIDE 0.9 % IV SOLN: INTRAVENOUS | Status: DC | PRN

## 2023-10-30 NOTE — CV Procedure (Signed)
    TRANSESOPHAGEAL ECHOCARDIOGRAM   NAME:  Caleb Simmons    MRN: [id number] DOB:  1951-01-14    ADMIT DATE: 10/27/2023  INDICATIONS: Bacteremia   PROCEDURE:   Informed consent was obtained prior to the procedure. The risks, benefits and alternatives for the procedure were discussed and the patient comprehended these risks.  Risks include, but are not limited to, cough, sore throat, vomiting, nausea, somnolence, esophageal and stomach trauma or perforation, bleeding, low blood pressure, aspiration, pneumonia, infection, trauma to the teeth and death.    Procedural time out performed.   Anesthesia was administered by anesthesia team (see their records).  The transesophageal probe was inserted in the esophagus and stomach without difficulty and multiple views were obtained.   COMPLICATIONS:    There were no immediate complications.  KEY FINDINGS:  LVEF preserved. No obvious vegetation seen - full report to follow.   Further management per primary team.   Tessa Lerner, DO, [facility]  [facility]  [address] [place] [address] [place], [place] [address] Pager: [phone] Office: [phone] 10/30/23 9:46 AM

## 2023-10-30 NOTE — Anesthesia Preprocedure Evaluation (Signed)
Anesthesia Evaluation  Patient identified by MRN, date of birth, ID band Patient awake    Reviewed: Allergy & Precautions, NPO status , Patient's Chart, lab work & pertinent test results, reviewed documented beta blocker date and time   Airway Mallampati: II  TM Distance: >3 FB Neck ROM: Full    Dental  (+) Teeth Intact, Dental Advisory Given   Pulmonary Current Smoker and Patient abstained from smoking.   Pulmonary exam normal breath sounds clear to auscultation       Cardiovascular hypertension, Pt. on home beta blockers + Peripheral Vascular Disease  Normal cardiovascular exam Rhythm:Regular Rate:Normal     Neuro/Psych  PSYCHIATRIC DISORDERS Anxiety     amyloidosis-chemotherapy CVA    GI/Hepatic Neg liver ROS,GERD  Medicated,,  Endo/Other  negative endocrine ROS    Renal/GU negative Renal ROS     Musculoskeletal negative musculoskeletal ROS (+)    Abdominal   Peds  Hematology  (+) Blood dyscrasia, anemia Bacteremia    Anesthesia Other Findings Day of surgery medications reviewed with the patient.  Reproductive/Obstetrics                             Anesthesia Physical Anesthesia Plan  ASA: 4  Anesthesia Plan: MAC   Post-op Pain Management: Minimal or no pain anticipated   Induction: Intravenous  PONV Risk Score and Plan: 0 and TIVA and Treatment may vary due to age or medical condition  Airway Management Planned: Natural Airway and Simple Face Mask  Additional Equipment:   Intra-op Plan:   Post-operative Plan:   Informed Consent: I have reviewed the patients History and Physical, chart, labs and discussed the procedure including the risks, benefits and alternatives for the proposed anesthesia with the patient or authorized representative who has indicated his/her understanding and acceptance.     Dental advisory given  Plan Discussed with: CRNA  Anesthesia Plan  Comments:        Anesthesia Quick Evaluation

## 2023-10-30 NOTE — Evaluation (Signed)
Physical Therapy Brief Evaluation and Discharge Note Patient Details Name: Caleb Simmons MRN: [id number] DOB: Apr 12, 1951 Today's Date: 10/30/2023   History of Present Illness  Patient is a 72 y/o male admitted 10/24/23 due to weakness, fever, fall and watery stools.  Found to have sepsis thought to be from possible PNA.  PMH positive for amyloidosis, ESRD on HD, PAD, CVA, and anemia.  Clinical Impression  Pt presents with admitting diagnosis above. Pt today was able to ambulate in hallway independently no AD however further distance was limited by c/o headache. PTA pt was very independent with no AD. Pt presents at or near baseline mobility. Pt has no further acute PT needs and will be signing off. Re consult PT if mobility status changes. Pt would benefit from continued mobility with mobility specialist during acute stay.     PT Assessment Patient does not need any further PT services  Assistance Needed at Discharge  PRN    Equipment Recommendations None recommended by PT  Recommendations for Other Services       Precautions/Restrictions Precautions Precautions: Fall Restrictions Weight Bearing Restrictions: No        Mobility  Bed Mobility   Supine/Sidelying to sit: Modified independent (Device/Increased time) Sit to supine/sidelying: Modified independent (Device/Increased time)    Transfers Overall transfer level: Modified independent Equipment used: None               General transfer comment: good overall balance    Ambulation/Gait Ambulation/Gait assistance: Independent Gait Distance (Feet): 100 Feet Assistive device: None Gait Pattern/deviations: Step-through pattern, Decreased stride length, Trunk flexed Gait Speed: Below normal General Gait Details: no LOB noted. Slow cautious gait.  Home Activity Instructions    Stairs Stairs: Yes Stairs assistance: Supervision Stair Management: One rail Right, No rails Number of Stairs: 3 General stair  comments: Simulated stairs using bed rails.  Modified Rankin (Stroke Patients Only)        Balance     Sitting balance-Leahy Scale: Good       Standing balance-Leahy Scale: Good            Pertinent Vitals/Pain PT - Brief Vital Signs All Vital Signs Stable: Yes Pain Assessment Pain Assessment: Faces Faces Pain Scale: Hurts little more Pain Location: headache Pain Descriptors / Indicators: Aching, Constant Pain Intervention(s): Monitored during session, Patient requesting pain meds-RN notified, Limited activity within patient's tolerance     Home Living Family/patient expects to be discharged to:: Private residence Living Arrangements: Non-relatives/Friends Available Help at Discharge: Friend(s);Available 24 hours/day Home Environment: Stairs to enter  [employer] of Steps: 3 Home Equipment: Agricultural consultant (2 wheels);Shower seat;Grab bars - tub/shower;Transport chair   Additional Comments: Pt lives with caregiver/friend    Prior Function Level of Independence: Independent      UE/LE Assessment   UE ROM/Strength/Tone/Coordination: WFL    LE ROM/Strength/Tone/Coordination: WFL      Communication   Communication Communication: No apparent difficulties     Cognition Overall Cognitive Status: Appears within functional limits for tasks assessed/performed       General Comments General comments (skin integrity, edema, etc.): VSS    Exercises     Assessment/Plan    PT Problem List         PT Visit Diagnosis Muscle weakness (generalized) (M62.81)    No Skilled PT All education completed;Patient at baseline level of functioning;Patient is independent with all acitivity/mobility   Co-evaluation  AMPAC 6 Clicks Help needed turning from your back to your side while in a flat bed without using bedrails?: None Help needed moving from lying on your back to sitting on the side of a flat bed without using bedrails?: None Help  needed moving to and from a bed to a chair (including a wheelchair)?: None Help needed standing up from a chair using your arms (e.g., wheelchair or bedside chair)?: None Help needed to walk in hospital room?: None Help needed climbing 3-5 steps with a railing? : None 6 Click Score: 24      End of Session Equipment Utilized During Treatment: Gait belt Activity Tolerance: Patient tolerated treatment well Patient left: in bed;with call bell/phone within reach Nurse Communication: Mobility status PT Visit Diagnosis: Muscle weakness (generalized) (M62.81)     Time: 5643-3295 PT Time Calculation (min) (ACUTE ONLY): 9 min  Charges:   PT Evaluation $PT Eval Low Complexity: 1 Low      Kyarra Vancamp B, PT, DPT Acute Rehab Services [phone]   Gladys Damme  10/30/2023, 2:28 PM

## 2023-10-30 NOTE — Anesthesia Postprocedure Evaluation (Signed)
Anesthesia Post Note  Patient: Caleb Simmons  Procedure(s) Performed: TRANSESOPHAGEAL ECHOCARDIOGRAM     Patient location during evaluation: Cath Lab Anesthesia Type: MAC Level of consciousness: awake and alert Pain management: pain level controlled Vital Signs Assessment: post-procedure vital signs reviewed and stable Respiratory status: spontaneous breathing, nonlabored ventilation, respiratory function stable and patient connected to nasal cannula oxygen Cardiovascular status: stable and blood pressure returned to baseline Postop Assessment: no apparent nausea or vomiting Anesthetic complications: no   No notable events documented.  Last Vitals:  Vitals:   10/30/23 1040 10/30/23 1100  BP: (!) 177/89 (!) 182/96  Pulse: 74 71  Resp: 10 20  Temp:  36.7 C  SpO2: 100% 100%    Last Pain:  Vitals:   10/30/23 1100  TempSrc: Oral  PainSc:                  Collene Schlichter

## 2023-10-30 NOTE — Interval H&P Note (Signed)
History and Physical Interval Note:  10/30/2023 8:28 AM  Caleb Simmons  has presented today for surgery, with the diagnosis of bacteremia.  The various methods of treatment have been discussed with the patient and family. After consideration of risks, benefits and other options for treatment, the patient has consented to  Procedure(s): TRANSESOPHAGEAL ECHOCARDIOGRAM (N/A) as a surgical intervention.  The patient's history has been reviewed, patient examined, no change in status, stable for surgery.  I have reviewed the patient's chart and labs.  Questions were answered to the patient's satisfaction.    After careful review of history and examination, the risks, benefits of transesophageal echocardiogram, and alternatives have been explained to the patient. Complications include but not limited to esophageal perforation (rare), gastrointestinal bleeding (rare), cardiac arrhythmia which can include cardiac arrest and death (rare), pharyngeal irritation / discomfort with swallowing / hematoma, methemoglobinemia, bronchospasm, transient hypoxia, nonsustained ventricular tachycardia, transient atrial fibrillation, minimal hemoptysis, vomiting, hypotension, respiratory compromise, reaction to medications, unavoidable damage to teeth and gums, aspiration pneumonia  were reviewed with the patient.  Patient voices understands, provides verbal feedback, questions answered, and patient  wishes to proceed with the procedure.  Tessa Lerner, DO, [facility]  [facility]  [address] [place], [place] [address]

## 2023-10-30 NOTE — Plan of Care (Signed)

## 2023-10-30 NOTE — Evaluation (Signed)
Occupational Therapy Evaluation Patient Details Name: Caleb Simmons MRN: [id number] DOB: 1951-04-21 Today's Date: 10/30/2023   History of Present Illness Patient is a 72 y/o male admitted 10/24/23 due to weakness, fever, fall and watery stools.  Found to have sepsis thought to be from possible PNA.  PMH positive for amyloidosis, ESRD on HD, PAD, CVA, and anemia.   Clinical Impression   Pt c/o headache, not in a good mood today due to earlier procedures and trying to relax. Pt lives with caregiver/friend who assists with groceries, medication, cleaning. Pt PLOF independent with ADLs, light cleaning. Pt currently close to baseline, displays good balance and ability to complete ADLs independently. Pt states he has all DME and support at home necessary to return at [facility]. No further acute OT or follow up needs.        If plan is discharge home, recommend the following: Assistance with cooking/housework;Assist for transportation;Help with stairs or ramp for entrance    Functional Status Assessment  Patient has had a recent decline in their functional status and demonstrates the ability to make significant improvements in function in a reasonable and predictable amount of time.  Equipment Recommendations  None recommended by OT    Recommendations for Other Services       Precautions / Restrictions Precautions Precautions: Fall Restrictions Weight Bearing Restrictions: No      Mobility Bed Mobility Overal bed mobility: Independent                  Transfers Overall transfer level: Modified independent Equipment used: None               General transfer comment: good overall balance      Balance Overall balance assessment: Needs assistance   Sitting balance-Leahy Scale: Good       Standing balance-Leahy Scale: Good                             ADL either performed or assessed with clinical judgement   ADL Overall ADL's : At baseline;Independent                                        General ADL Comments: displays good balance and ability to complete ADLs.     Vision Baseline Vision/History: 0 No visual deficits Ability to See in Adequate Light: 0 Adequate Patient Visual Report: No change from baseline       Perception         Praxis         Pertinent Vitals/Pain Pain Assessment Pain Assessment: Faces Faces Pain Scale: Hurts little more Pain Location: headache Pain Descriptors / Indicators: Aching, Constant Pain Intervention(s): Monitored during session     Extremity/Trunk Assessment Upper Extremity Assessment Upper Extremity Assessment: Overall WFL for tasks assessed           Communication Communication Communication: No apparent difficulties   Cognition Arousal: Alert Behavior During Therapy: Agitated Overall Cognitive Status: Within Functional Limits for tasks assessed                                 General Comments: Pt states he is not in a good mood, agitated to perform OOB activities, calmed down towards end of session and assisted back to bed.  General Comments       Exercises     Shoulder Instructions      Home Living Family/patient expects to be discharged to:: Private residence Living Arrangements: Non-relatives/Friends Available Help at Discharge: Friend(s);Available 24 hours/day Type of Home: House Home Access: Stairs to enter [employer] of Steps: 3 Entrance Stairs-Rails: Right;Left Home Layout: One level     Bathroom Shower/Tub: Chief Strategy Officer: Standard     Home Equipment: Agricultural consultant (2 wheels);Shower seat;Grab bars - tub/shower;Transport chair   Additional Comments: Pt lives with caregiver/friend      Prior Functioning/Environment Prior Level of Function : Independent/Modified Independent               ADLs Comments: caregiver/friend helps with IADLs, groceries, cooking.        OT Problem  List: Decreased activity tolerance;Pain      OT Treatment/Interventions:      OT Goals(Current goals can be found in the care plan section) Acute Rehab OT Goals Patient Stated Goal: to return home OT Goal Formulation: With patient Time For Goal Achievement: 11/13/23 Potential to Achieve Goals: Good  OT Frequency:      Co-evaluation              AM-PAC OT "6 Clicks" Daily Activity     Outcome Measure Help from another person eating meals?: None Help from another person taking care of personal grooming?: None Help from another person toileting, which includes using toliet, bedpan, or urinal?: None Help from another person bathing (including washing, rinsing, drying)?: None Help from another person to put on and taking off regular upper body clothing?: None Help from another person to put on and taking off regular lower body clothing?: None 6 Click Score: 24   End of Session Nurse Communication: Mobility status  Activity Tolerance: Treatment limited secondary to agitation Patient left: in bed;with call bell/phone within reach;with family/visitor present  OT Visit Diagnosis: Pain Pain - part of body:  (head)                Time: 1610-9604 OT Time Calculation (min): 17 min Charges:  OT General Charges $OT Visit: 1 Visit OT Evaluation $OT Eval Low Complexity: 1 Low  [address], OTR/L   Alexis Goodell 10/30/2023, 1:58 PM

## 2023-10-30 NOTE — Progress Notes (Signed)
*  PRELIMINARY RESULTS* Echocardiogram Echocardiogram Transesophageal has been performed.  Caleb Simmons 10/30/2023, 9:52 AM

## 2023-10-30 NOTE — Progress Notes (Signed)
[facility] Progress Note   Subjective:   Patient had TEE today with no vegetation. Reports he feels ok but is hungry, no diet ordered, will order renal diet but make him NPO again at midnight in anticipation of new TDC. Denies SOB, CP, dizziness.   Objective Vitals:   10/30/23 1025 10/30/23 1030 10/30/23 1035 10/30/23 1040  BP: (!) 188/94 (!) 186/96 (!) 184/94 (!) 177/89  Pulse: 73 74 70 74  Resp: 17 12 13 10   Temp:      TempSrc:      SpO2: 93% 98% 100% 100%  Weight:      Height:       Physical Exam General: Alert male in NAD Heart: RRR, no murmurs, rubs or gallops Lungs: CTA bilaterally, respirations unlabored on RA Abdomen: Soft, non-distended, +BS Extremities: No edema b/l lower extremities Dialysis Access:  Dry bandage over prior [facility] site  Additional Objective Labs: Basic Metabolic Panel: Recent Labs  Lab 10/25/23 0547 10/26/23 0458 10/27/23 1717 10/29/23 0354  NA 133* 134* 134* 136  K 3.8 4.4 3.3* 3.5  CL 98 97* 99 104  CO2 23 22 25 24   GLUCOSE 103* 109* 117* 114*  BUN 32* 53* 25* 39*  CREATININE 4.50* 5.45* 3.35* 4.40*  CALCIUM 7.7* 7.9* 7.8* 7.3*  PHOS 4.7*  --   --   --    Liver Function Tests: Recent Labs  Lab 10/24/23 1419 10/25/23 0547 10/26/23 0458 10/27/23 1717  AST 41  --  37 33  ALT 16  --  13 13  ALKPHOS 72  --  58 69  BILITOT 0.9  --  0.6 0.5  PROT 5.3*  --  4.7* 5.2*  ALBUMIN 2.8* 2.4* 2.2* 2.6*   No results for input(s): "LIPASE", "AMYLASE" in the last 168 hours. CBC: Recent Labs  Lab 10/24/23 1419 10/25/23 0547 10/26/23 0458 10/27/23 1717 10/29/23 0354 10/30/23 0602  WBC 20.2* 11.4* 9.1 7.5 7.0 10.2  NEUTROABS 19.0*  --   --  5.7  --   --   HGB 7.9* 7.9* 7.4* 8.0* 7.3* 8.5*  HCT 24.7* 23.8* 22.6* 25.3* 22.8* 26.6*  MCV 94.6 93.0 93.0 96.6 93.4 93.7  PLT 145* 129* 131* 150 141* 180   Blood Culture    Component Value Date/Time   SDES BLOOD LEFT HAND 10/27/2023 1728   SPECREQUEST  10/27/2023 1728     BOTTLES DRAWN AEROBIC AND ANAEROBIC Blood Culture adequate volume   CULT  10/27/2023 1728    NO GROWTH 3 DAYS Performed at [facility], [address] [place], [place] [address]    REPTSTATUS PENDING 10/27/2023 1728    Cardiac Enzymes: No results for input(s): "CKTOTAL", "CKMB", "CKMBINDEX", "TROPONINI" in the last 168 hours. CBG: No results for input(s): "GLUCAP" in the last 168 hours. Iron Studies: No results for input(s): "IRON", "TIBC", "TRANSFERRIN", "FERRITIN" in the last 72 hours. @lablastinr3 @ Studies/Results: EP STUDY  Result Date: 10/30/2023 See surgical note for result.  ECHOCARDIOGRAM COMPLETE  Result Date: 10/29/2023    ECHOCARDIOGRAM REPORT   Patient Name:   Caleb Simmons Date of Exam: 10/29/2023 Medical Rec #:  [id number]    Height:       70.0 in Accession #:    [id number]   Weight:       138.4 lb Date of Birth:  1951/05/21     BSA:          1.785 m Patient Age:    72 years  BP:           160/85 mmHg Patient Gender: M            HR:           67 bpm. Exam Location:  Inpatient Procedure: 2D Echo, Cardiac Doppler, Color Doppler and Strain Analysis Indications:    Bacteremia  History:        Patient has prior history of Echocardiogram examinations, most                 recent 07/30/2022. CHF, Stroke; Risk Factors:Hypertension and                 Current Smoker.  Sonographer:    Karma Ganja Referring Phys: 53 DAWOOD Teena Irani  Sonographer Comments: Global longitudinal strain was attempted. IMPRESSIONS  1. Left ventricular ejection fraction, by estimation, is 50 to 55%. The left ventricle has low normal function. The left ventricle has no regional wall motion abnormalities. There is mild left ventricular hypertrophy. Left ventricular diastolic parameters are consistent with Grade III diastolic dysfunction (restrictive). Elevated left atrial pressure.  2. Right ventricular systolic function is normal. The right ventricular size is normal. There is mildly elevated pulmonary  artery systolic pressure. The estimated right ventricular systolic pressure is 42.3 mmHg.  3. Left atrial size was moderately dilated.  4. The mitral valve is normal in structure. Trivial mitral valve regurgitation. No evidence of mitral stenosis.  5. The aortic valve was not well visualized. Aortic valve regurgitation is not visualized. Aortic valve sclerosis/calcification is present, without any evidence of aortic stenosis.  6. The inferior vena cava is dilated in size with >50% respiratory variability, suggesting right atrial pressure of 8 mmHg. Conclusion(s)/Recommendation(s): No clear vegetation seen, but technically difficult study. Consider a transesophageal echocardiogram to exclude infective endocarditis if clinically indicated. FINDINGS  Left Ventricle: Left ventricular ejection fraction, by estimation, is 50 to 55%. The left ventricle has low normal function. The left ventricle has no regional wall motion abnormalities. The left ventricular internal cavity size was normal in size. There is mild left ventricular hypertrophy. Left ventricular diastolic parameters are consistent with Grade III diastolic dysfunction (restrictive). Elevated left atrial pressure. Right Ventricle: The right ventricular size is normal. No increase in right ventricular wall thickness. Right ventricular systolic function is normal. There is mildly elevated pulmonary artery systolic pressure. The tricuspid regurgitant velocity is 2.93  m/s, and with an assumed right atrial pressure of 8 mmHg, the estimated right ventricular systolic pressure is 42.3 mmHg. Left Atrium: Left atrial size was moderately dilated. Right Atrium: Right atrial size was normal in size. Pericardium: There is no evidence of pericardial effusion. Mitral Valve: The mitral valve is normal in structure. Trivial mitral valve regurgitation. No evidence of mitral valve stenosis. Tricuspid Valve: The tricuspid valve is normal in structure. Tricuspid valve regurgitation  is trivial. Aortic Valve: The aortic valve was not well visualized. Aortic valve regurgitation is not visualized. Aortic valve sclerosis/calcification is present, without any evidence of aortic stenosis. Aortic valve mean gradient measures 6.0 mmHg. Aortic valve peak gradient measures 14.6 mmHg. Aortic valve area, by VTI measures 0.90 cm. Pulmonic Valve: The pulmonic valve was not well visualized. Pulmonic valve regurgitation is not visualized. Aorta: The aortic root is normal in size and structure. Venous: The inferior vena cava is dilated in size with greater than 50% respiratory variability, suggesting right atrial pressure of 8 mmHg. IAS/Shunts: The interatrial septum was not well visualized.  LEFT VENTRICLE PLAX 2D LVIDd:  5.60 cm      Diastology LVIDs:         4.40 cm      LV e' medial:    4.79 cm/s LV PW:         1.10 cm      LV E/e' medial:  17.7 LV IVS:        1.60 cm      LV e' lateral:   7.51 cm/s LVOT diam:     2.00 cm      LV E/e' lateral: 11.3 LV SV:         32 LV SV Index:   18 LVOT Area:     3.14 cm  LV Volumes (MOD) LV vol d, MOD A2C: 108.0 ml LV vol d, MOD A4C: 118.0 ml LV vol s, MOD A2C: 59.1 ml LV vol s, MOD A4C: 58.1 ml LV SV MOD A2C:     48.9 ml LV SV MOD A4C:     118.0 ml LV SV MOD BP:      53.5 ml RIGHT VENTRICLE             IVC RV Basal diam:  3.80 cm     IVC diam: 2.10 cm RV S prime:     12.30 cm/s TAPSE (M-mode): 2.1 cm LEFT ATRIUM             Index        RIGHT ATRIUM           Index LA diam:        4.30 cm 2.41 cm/m   RA Area:     17.30 cm LA Vol (A2C):   95.8 ml 53.66 ml/m  RA Volume:   49.10 ml  27.50 ml/m LA Vol (A4C):   65.3 ml 36.58 ml/m LA Biplane Vol: 86.5 ml 48.45 ml/m  AORTIC VALVE AV Area (Vmax):    1.13 cm AV Area (Vmean):   1.34 cm AV Area (VTI):     0.90 cm AV Vmax:           191.00 cm/s AV Vmean:          115.000 cm/s AV VTI:            0.357 m AV Peak Grad:      14.6 mmHg AV Mean Grad:      6.0 mmHg LVOT Vmax:         68.50 cm/s LVOT Vmean:        49.000  cm/s LVOT VTI:          0.102 m LVOT/AV VTI ratio: 0.29  AORTA Ao Root diam: 3.70 cm MITRAL VALVE               TRICUSPID VALVE MV Area (PHT): 4.54 cm    TR Peak grad:   34.3 mmHg MV Decel Time: 167 msec    TR Vmax:        293.00 cm/s MV E velocity: 84.80 cm/s MV A velocity: 24.90 cm/s  SHUNTS MV E/A ratio:  3.41        Systemic VTI:  0.10 m                            Systemic Diam: 2.00 cm Epifanio Lesches MD Electronically signed by Epifanio Lesches MD Signature Date/Time: 10/29/2023/10:24:48 AM    Final    IR Removal Tun Cv Cath W/O FL  Result Date: 10/28/2023 INDICATION: Patient with  a history of ESRD on hemodialysis via right IJ tunneled dialysis catheter. Catheter last exchanged 05/02/22. Patient now admitted with bacteremia that is likely catheter related. IR requested to removed catheter for a line holiday. EXAM: REMOVAL TUNNELED CENTRAL VENOUS CATHETER MEDICATIONS: 1% lidocaine 20 mL ANESTHESIA/SEDATION: none FLUOROSCOPY: none COMPLICATIONS: None immediate. PROCEDURE: Informed written consent was obtained from the patient after a thorough discussion of the procedural risks, benefits and alternatives. All questions were addressed. Maximal Sterile Barrier Technique was utilized including caps, mask, sterile gowns, sterile gloves, sterile drape, hand hygiene and skin antiseptic. A timeout was performed prior to the initiation of the procedure. The patient's right chest and catheter was prepped and draped in a normal sterile fashion. Heparin was removed from both ports of catheter. 1% lidocaine was used for local anesthesia. Using gentle blunt dissection and moderate manual traction the cuff of the catheter was exposed and the catheter was removed in it's entirety. The catheter had developed a fibrin sheath which was pulled out of the skin when the catheter was removed. The fibrin sheath was removed with scissors. Pressure was held till hemostasis was obtained. A sterile dressing was applied. The  patient tolerated the procedure well with no immediate complications. IMPRESSION: Successful catheter removal as described above. Procedure performed by Alwyn Ren NP Electronically Signed   By: Gilmer Mor D.O.   On: 10/28/2023 14:45   Medications:   ceFAZolin (ANCEF) IV 1 g (10/29/23 1936)    aspirin EC  81 mg Oral Daily   Chlorhexidine Gluconate Cloth  6 each Topical Daily   heparin injection (subcutaneous)  5,000 Units Subcutaneous Q8H   midodrine  5 mg Oral TID WC   mirtazapine  30 mg Oral QHS   pantoprazole  40 mg Oral BID AC   senna-docusate  1 tablet Oral Daily   tamsulosin  0.4 mg Oral Daily    Dialysis Orders:  Center: [facility]  on Mon/Fri. 180Nre 4 hours BFR 400 DFR Auto 1.5 EDW 63.5kg 2K 2.5 Ca TDC Heparin 2000 unit bolus Calcitriol 0.5 mcg PO q HD Not on ESA (hx amyloidosis)   Assessment/Plan:  Staph aureus bacteremia: Outpatient blood cultures positive for staph aureus x2. On vancomycin. TDC removed 10/28/23 for line holiday. Next HD is not until Friday so will plan to replace TDC at that time.  ESRD:  On HD on Mon/Fri, no acute indications for HD today. Next HD Friday.   Hypertension/volume: BP is elevated. Appears he is on midodrine TID, holding for now.   Anemia: Hgb 8.5. 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 Reportedly had a positive FOBT last admission, monitor Hgb and transfuse PRN.   Metabolic bone disease: Corrected calcium at goal. Phos at goal. Continue VDRA  Nutrition:  Poor PO intake due to hospital visits, appetite is good. Continue renal diet.  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 Reports stable so far.      Rogers Blocker, PA-C 10/30/2023, 10:57 AM  [facility] Pager: [phone]

## 2023-10-30 NOTE — Plan of Care (Signed)
  Problem: Health Behavior/Discharge Planning: Goal: Ability to manage health-related needs will improve Outcome: Progressing   Problem: Clinical Measurements: Goal: Ability to maintain clinical measurements within normal limits will improve Outcome: Progressing Goal: Will remain free from infection Outcome: Progressing   Problem: Nutrition: Goal: Adequate nutrition will be maintained Outcome: Progressing   

## 2023-10-30 NOTE — Transfer of Care (Signed)
Immediate Anesthesia Transfer of Care Note  Patient: Caleb Simmons  Procedure(s) Performed: TRANSESOPHAGEAL ECHOCARDIOGRAM  Patient Location: PACU  Anesthesia Type:MAC  Level of Consciousness: awake and drowsy  Airway & Oxygen Therapy: Patient Spontanous Breathing and Patient connected to nasal cannula oxygen  Post-op Assessment: Report given to RN and Post -op Vital signs reviewed and stable  Post vital signs: Reviewed and stable  Last Vitals:  Vitals Value Taken Time  BP    Temp    Pulse    Resp    SpO2      Last Pain:  Vitals:   10/30/23 0817  TempSrc: Temporal  PainSc:       Patients Stated Pain Goal: 0 (10/29/23 2140)  Complications: No notable events documented.

## 2023-10-30 NOTE — Progress Notes (Signed)
PROGRESS NOTE        PATIENT DETAILS Name: Caleb Simmons Age: 72 y.o. Sex: male Date of Birth: 1951/12/10 Admit Date: 10/27/2023 Admitting Physician Buena Irish, MD ZOX:WRUE, Terrace Arabia, MD  Brief Summary: Patient is a 72 y.o.  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  Significant events: 11/4>> admit to [facility]-outpatient blood cultures positive for MSSA  Significant studies: 11/4>> CXR No pneumonia 11/6>> TTE: EF 50-55%, grade 3 diastolic dysfunction 11/7>> TEE: No vegetation  Significant microbiology data: 11/4>> blood culture: No growth  Procedures: None  Consults: Nephrology ID  Subjective: No complaints-lying comfortably in bed-significant other at bedside.  Objective: Vitals: Blood pressure (!) 189/89, pulse 81, temperature (!) 97.4 F (36.3 C), temperature source Temporal, resp. rate 17, height 5\' 10"  (1.778 m), weight 62.8 kg, SpO2 91%.   Exam: Gen Exam:Alert awake-not in any distress HEENT:atraumatic, normocephalic Chest: B/L clear to auscultation anteriorly CVS:S1S2 regular Abdomen:soft non tender, non distended Extremities:no edema Neurology: Non focal Skin: no rash  Pertinent Labs/Radiology:    Latest Ref Rng & Units 10/30/2023    6:02 AM 10/29/2023    3:54 AM 10/27/2023    5:17 PM  CBC  WBC 4.0 - 10.5 K/uL 10.2  7.0  7.5   Hemoglobin 13.0 - 17.0 g/dL 8.5  7.3  8.0   Hematocrit 39.0 - 52.0 % 26.6  22.8  25.3   Platelets 150 - 400 K/uL 180  141  150     Lab Results  Component Value Date   NA 136 10/29/2023   K 3.5 10/29/2023   CL 104 10/29/2023   CO2 24 10/29/2023      Assessment/Plan: MSSA bacteremia Likely associated with HD catheter-which was removed 11/5 by IR Line holiday in progress Repeat cultures negative so far TEE without vegetations On Ancef Await further recommendations from infectious  disease.   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   Weight as of this encounter: 62.8 kg.   Code status:   Code Status: Prior   DVT Prophylaxis: heparin injection 5,000 Units Start: 10/28/23 1400   Family Communication: Significant other at bedside   Disposition Plan: Status is: Inpatient Remains inpatient appropriate because: Severity of illness   Planned Discharge Destination:Home   Diet: Diet Order             Diet NPO time specified  Diet effective midnight                     Antimicrobial agents: Anti-infectives (From admission, onward)    Start     Dose/Rate Route Frequency Ordered Stop   10/29/23 2200  cefdinir (OMNICEF) capsule 300 mg  Status:  Discontinued       Note to Pharmacy: Take 300 mg (1 capsule) on Monday after dialysis then again take 300 mg (1  capsule) on Wednesday afternoon     300 mg Oral  Once 10/27/23 2234 10/28/23 1139   10/28/23 1800  [MAR Hold]  ceFAZolin (ANCEF) IVPB 1 g/50 mL premix        (MAR Hold since Thu 10/30/2023 at 0822.Hold Reason: Transfer to a Procedural area)   1 g 100 mL/hr over 30 Minutes Intravenous Every 24 hours 10/28/23 1139     10/27/23 2125  vancomycin variable dose per unstable renal function (pharmacist dosing)  Status:  Discontinued         Does not apply See admin instructions 10/27/23 2125 10/28/23 1139   10/27/23 2115  vancomycin (VANCOREADY) IVPB 1500 mg/300 mL         1,500 mg 150 mL/hr over 120 Minutes Intravenous  Once 10/27/23 2110 10/27/23 2338        MEDICATIONS: Scheduled Meds:  [MAR Hold] aspirin EC  81 mg Oral Daily   [MAR Hold] Chlorhexidine Gluconate Cloth  6 each Topical Daily   [MAR Hold] heparin injection (subcutaneous)  5,000 Units Subcutaneous Q8H   [MAR Hold] midodrine  5 mg Oral TID WC   [MAR Hold] mirtazapine  30 mg Oral QHS   [MAR Hold] pantoprazole  40 mg Oral BID AC   [MAR Hold] senna-docusate  1 tablet Oral Daily   [MAR Hold] sodium chloride flush  10 mL Intravenous Q12H   [MAR Hold] tamsulosin  0.4 mg Oral Daily   Continuous Infusions:  [MAR Hold]  ceFAZolin (ANCEF) IV 1 g (10/29/23 1936)   PRN Meds:.[MAR Hold] acetaminophen, [MAR Hold] ALPRAZolam, [MAR Hold] oxyCODONE   I have personally reviewed following labs and imaging studies  LABORATORY DATA: CBC: Recent Labs  Lab 10/24/23 1419 10/25/23 0547 10/26/23 0458 10/27/23 1717 10/29/23 0354 10/30/23 0602  WBC 20.2* 11.4* 9.1 7.5 7.0 10.2  NEUTROABS 19.0*  --   --  5.7  --   --   HGB 7.9* 7.9* 7.4* 8.0* 7.3* 8.5*  HCT 24.7* 23.8* 22.6* 25.3* 22.8* 26.6*  MCV 94.6 93.0 93.0 96.6 93.4 93.7  PLT 145* 129* 131* 150 141* 180    Basic Metabolic Panel: Recent Labs  Lab 10/24/23 1419 10/24/23 2137 10/25/23 0547 10/26/23 0458 10/27/23 1717 10/29/23 0354  NA 133*  --  133* 134* 134* 136  K 3.6  --  3.8 4.4 3.3* 3.5  CL 97*  --  98 97* 99 104  CO2 22  --  23 22 25 24   GLUCOSE 126*  --  103* 109* 117* 114*  BUN 21  --  32* 53* 25* 39*  CREATININE 3.51*  --  4.50* 5.45* 3.35* 4.40*  CALCIUM 7.9*  --  7.7* 7.9* 7.8* 7.3*  MG  --  1.6*  --   --   --   --   PHOS  --   --  4.7*  --   --   --     GFR: Estimated Creatinine Clearance: 13.3 mL/min (A) (by C-G formula based on SCr of 4.4 mg/dL (H)).  Liver Function Tests: Recent Labs  Lab 10/24/23 1419 10/25/23 0547 10/26/23 0458 10/27/23 1717  AST 41  --  37 33  ALT 16  --  13 13  ALKPHOS 72  --  58  69  BILITOT 0.9  --  0.6 0.5  PROT 5.3*  --  4.7* 5.2*  ALBUMIN 2.8* 2.4* 2.2* 2.6*   No results for input(s): "LIPASE", "AMYLASE" in the last 168 hours. No results for input(s): "AMMONIA" in  the last 168 hours.  Coagulation Profile: Recent Labs  Lab 10/24/23 1419  INR 1.1    Cardiac Enzymes: No results for input(s): "CKTOTAL", "CKMB", "CKMBINDEX", "TROPONINI" in the last 168 hours.  BNP (last 3 results) No results for input(s): "PROBNP" in the last 8760 hours.  Lipid Profile: No results for input(s): "CHOL", "HDL", "LDLCALC", "TRIG", "CHOLHDL", "LDLDIRECT" in the last 72 hours.  Thyroid Function Tests: No results for input(s): "TSH", "T4TOTAL", "FREET4", "T3FREE", "THYROIDAB" in the last 72 hours.  Anemia Panel: No results for input(s): "VITAMINB12", "FOLATE", "FERRITIN", "TIBC", "IRON", "RETICCTPCT" in the last 72 hours.  Urine analysis:    Component Value Date/Time   COLORURINE YELLOW 10/25/2023 0605   APPEARANCEUR CLEAR 10/25/2023 0605   LABSPEC 1.012 10/25/2023 0605   PHURINE 7.0 10/25/2023 0605   GLUCOSEU NEGATIVE 10/25/2023 0605   HGBUR SMALL (A) 10/25/2023 0605   BILIRUBINUR NEGATIVE 10/25/2023 0605   KETONESUR NEGATIVE 10/25/2023 0605   PROTEINUR >=300 (A) 10/25/2023 0605   UROBILINOGEN 0.2 06/26/2007 1023   NITRITE NEGATIVE 10/25/2023 0605   LEUKOCYTESUR NEGATIVE 10/25/2023 0605    Sepsis Labs: Lactic Acid, Venous    Component Value Date/Time   LATICACIDVEN 1.2 10/27/2023 2054    MICROBIOLOGY: Recent Results (from the past 240 hour(s))  Culture, blood (Routine x 2)     Status: None   Collection Time: 10/24/23  2:19 PM   Specimen: BLOOD  Result Value Ref Range Status   Specimen Description BLOOD SITE NOT SPECIFIED  Final   Special Requests   Final    BOTTLES DRAWN AEROBIC AND ANAEROBIC Blood Culture adequate volume   Culture   Final    NO GROWTH 5 DAYS Performed at [facility], [address], [place], [place] [address]    Report  Status 10/29/2023 FINAL  Final  Culture, blood (Routine x 2)     Status: None   Collection Time: 10/24/23  2:32 PM   Specimen: BLOOD RIGHT ARM  Result Value Ref Range Status   Specimen Description BLOOD RIGHT ARM  Final   Special Requests   Final    BOTTLES DRAWN AEROBIC AND ANAEROBIC Blood Culture results may not be optimal due to an excessive volume of blood received in culture bottles   Culture   Final    NO GROWTH 5 DAYS Performed at [facility], [address], [place], [place] [address]    Report Status 10/29/2023 FINAL  Final  Resp panel by RT-PCR (RSV, Flu A&B, Covid) Peripheral     Status: None   Collection Time: 10/24/23  2:41 PM   Specimen: Peripheral; Nasal Swab  Result Value Ref Range Status   SARS Coronavirus 2 by RT PCR NEGATIVE NEGATIVE Final   Influenza A by PCR NEGATIVE NEGATIVE Final   Influenza B by PCR NEGATIVE NEGATIVE Final    Comment: (NOTE) The Xpert Xpress SARS-CoV-2/FLU/RSV plus assay is intended as an aid in the diagnosis of influenza from Nasopharyngeal swab specimens and should not be used as a sole basis for treatment. Nasal washings and aspirates are unacceptable for Xpert Xpress SARS-CoV-2/FLU/RSV testing.  Fact Sheet for Patients: BloggerCourse.com  Fact Sheet for Healthcare Providers: SeriousBroker.it  This test is not yet approved or cleared by the [place] FDA and has been authorized for detection and/or diagnosis of SARS-CoV-2 by FDA under an Emergency Use Authorization (EUA). This EUA will remain in effect (meaning this test can be used) for the duration of the COVID-19 declaration under Section 564(b)(1)  of the Act, 21 U.S.C. section 360bbb-3(b)(1), unless the authorization is terminated or revoked.     Resp Syncytial Virus by PCR NEGATIVE NEGATIVE Final    Comment: (NOTE) Fact Sheet for Patients: BloggerCourse.com  Fact Sheet for Healthcare  Providers: SeriousBroker.it  This test is not yet approved or cleared by the [place] FDA and has been authorized for detection and/or diagnosis of SARS-CoV-2 by FDA under an Emergency Use Authorization (EUA). This EUA will remain in effect (meaning this test can be used) for the duration of the COVID-19 declaration under Section 564(b)(1) of the Act, 21 U.S.C. section 360bbb-3(b)(1), unless the authorization is terminated or revoked.  Performed at [facility], [address], [place], [place] [address]   Respiratory (~20 pathogens) panel by PCR     Status: None   Collection Time: 10/25/23  8:09 AM   Specimen: Nasopharyngeal Swab; Respiratory  Result Value Ref Range Status   Adenovirus NOT DETECTED NOT DETECTED Final   Coronavirus 229E NOT DETECTED NOT DETECTED Final    Comment: (NOTE) The Coronavirus on the Respiratory Panel, DOES NOT test for the novel  Coronavirus (2019 nCoV)    Coronavirus HKU1 NOT DETECTED NOT DETECTED Final   Coronavirus NL63 NOT DETECTED NOT DETECTED Final   Coronavirus OC43 NOT DETECTED NOT DETECTED Final   Metapneumovirus NOT DETECTED NOT DETECTED Final   Rhinovirus / Enterovirus NOT DETECTED NOT DETECTED Final   Influenza A NOT DETECTED NOT DETECTED Final   Influenza B NOT DETECTED NOT DETECTED Final   Parainfluenza Virus 1 NOT DETECTED NOT DETECTED Final   Parainfluenza Virus 2 NOT DETECTED NOT DETECTED Final   Parainfluenza Virus 3 NOT DETECTED NOT DETECTED Final   Parainfluenza Virus 4 NOT DETECTED NOT DETECTED Final   Respiratory Syncytial Virus NOT DETECTED NOT DETECTED Final   Bordetella pertussis NOT DETECTED NOT DETECTED Final   Bordetella Parapertussis NOT DETECTED NOT DETECTED Final   Chlamydophila pneumoniae NOT DETECTED NOT DETECTED Final   Mycoplasma pneumoniae NOT DETECTED NOT DETECTED Final    Comment: Performed at [facility], [address] [place], [place] [address]  Blood culture  (routine x 2)     Status: None (Preliminary result)   Collection Time: 10/27/23  5:22 PM   Specimen: BLOOD  Result Value Ref Range Status   Specimen Description BLOOD SITE NOT SPECIFIED  Final   Special Requests   Final    BOTTLES DRAWN AEROBIC AND ANAEROBIC Blood Culture results may not be optimal due to an inadequate volume of blood received in culture bottles   Culture   Final    NO GROWTH 3 DAYS Performed at [facility], [address] [place], [place] [address]    Report Status PENDING  Incomplete  Blood culture (routine x 2)     Status: None (Preliminary result)   Collection Time: 10/27/23  5:28 PM   Specimen: BLOOD LEFT HAND  Result Value Ref Range Status   Specimen Description BLOOD LEFT HAND  Final   Special Requests   Final    BOTTLES DRAWN AEROBIC AND ANAEROBIC Blood Culture adequate volume   Culture   Final    NO GROWTH 3 DAYS Performed at [facility], [address], [place], [place] [address]    Report Status PENDING  Incomplete    RADIOLOGY STUDIES/RESULTS: EP STUDY  Result Date: 10/30/2023 See surgical note for result.  ECHOCARDIOGRAM COMPLETE  Result Date: 10/29/2023    ECHOCARDIOGRAM REPORT  Patient Name:   RYEN RHAMES Date of Exam: 10/29/2023 Medical Rec #:  [id number]    Height:       70.0 in Accession #:    [id number]   Weight:       138.4 lb Date of Birth:  [birth year]     BSA:          1.785 m Patient Age:    72 years     BP:           160/85 mmHg Patient Gender: M            HR:           67 bpm. Exam Location:  Inpatient Procedure: 2D Echo, Cardiac Doppler, Color Doppler and Strain Analysis Indications:    Bacteremia  History:        Patient has prior history of Echocardiogram examinations, most                 recent 07/30/2022. CHF, Stroke; Risk Factors:Hypertension and                 Current Smoker.  Sonographer:    Karma Ganja Referring Phys: 54 DAWOOD Teena Irani  Sonographer Comments: Global longitudinal strain was attempted. IMPRESSIONS   1. Left ventricular ejection fraction, by estimation, is 50 to 55%. The left ventricle has low normal function. The left ventricle has no regional wall motion abnormalities. There is mild left ventricular hypertrophy. Left ventricular diastolic parameters are consistent with Grade III diastolic dysfunction (restrictive). Elevated left atrial pressure.  2. Right ventricular systolic function is normal. The right ventricular size is normal. There is mildly elevated pulmonary artery systolic pressure. The estimated right ventricular systolic pressure is 42.3 mmHg.  3. Left atrial size was moderately dilated.  4. The mitral valve is normal in structure. Trivial mitral valve regurgitation. No evidence of mitral stenosis.  5. The aortic valve was not well visualized. Aortic valve regurgitation is not visualized. Aortic valve sclerosis/calcification is present, without any evidence of aortic stenosis.  6. The inferior vena cava is dilated in size with >50% respiratory variability, suggesting right atrial pressure of 8 mmHg. Conclusion(s)/Recommendation(s): No clear vegetation seen, but technically difficult study. Consider a transesophageal echocardiogram to exclude infective endocarditis if clinically indicated. FINDINGS  Left Ventricle: Left ventricular ejection fraction, by estimation, is 50 to 55%. The left ventricle has low normal function. The left ventricle has no regional wall motion abnormalities. The left ventricular internal cavity size was normal in size. There is mild left ventricular hypertrophy. Left ventricular diastolic parameters are consistent with Grade III diastolic dysfunction (restrictive). Elevated left atrial pressure. Right Ventricle: The right ventricular size is normal. No increase in right ventricular wall thickness. Right ventricular systolic function is normal. There is mildly elevated pulmonary artery systolic pressure. The tricuspid regurgitant velocity is 2.93  m/s, and with an assumed  right atrial pressure of 8 mmHg, the estimated right ventricular systolic pressure is 42.3 mmHg. Left Atrium: Left atrial size was moderately dilated. Right Atrium: Right atrial size was normal in size. Pericardium: There is no evidence of pericardial effusion. Mitral Valve: The mitral valve is normal in structure. Trivial mitral valve regurgitation. No evidence of mitral valve stenosis. Tricuspid Valve: The tricuspid valve is normal in structure. Tricuspid valve regurgitation is trivial. Aortic Valve: The aortic valve was not well visualized. Aortic valve regurgitation is not visualized. Aortic valve sclerosis/calcification is present, without any evidence of aortic stenosis. Aortic valve mean gradient measures 6.0  mmHg. Aortic valve peak gradient measures 14.6 mmHg. Aortic valve area, by VTI measures 0.90 cm. Pulmonic Valve: The pulmonic valve was not well visualized. Pulmonic valve regurgitation is not visualized. Aorta: The aortic root is normal in size and structure. Venous: The inferior vena cava is dilated in size with greater than 50% respiratory variability, suggesting right atrial pressure of 8 mmHg. IAS/Shunts: The interatrial septum was not well visualized.  LEFT VENTRICLE PLAX 2D LVIDd:         5.60 cm      Diastology LVIDs:         4.40 cm      LV e' medial:    4.79 cm/s LV PW:         1.10 cm      LV E/e' medial:  17.7 LV IVS:        1.60 cm      LV e' lateral:   7.51 cm/s LVOT diam:     2.00 cm      LV E/e' lateral: 11.3 LV SV:         32 LV SV Index:   18 LVOT Area:     3.14 cm  LV Volumes (MOD) LV vol d, MOD A2C: 108.0 ml LV vol d, MOD A4C: 118.0 ml LV vol s, MOD A2C: 59.1 ml LV vol s, MOD A4C: 58.1 ml LV SV MOD A2C:     48.9 ml LV SV MOD A4C:     118.0 ml LV SV MOD BP:      53.5 ml RIGHT VENTRICLE             IVC RV Basal diam:  3.80 cm     IVC diam: 2.10 cm RV S prime:     12.30 cm/s TAPSE (M-mode): 2.1 cm LEFT ATRIUM             Index        RIGHT ATRIUM           Index LA diam:        4.30 cm  2.41 cm/m   RA Area:     17.30 cm LA Vol (A2C):   95.8 ml 53.66 ml/m  RA Volume:   49.10 ml  27.50 ml/m LA Vol (A4C):   65.3 ml 36.58 ml/m LA Biplane Vol: 86.5 ml 48.45 ml/m  AORTIC VALVE AV Area (Vmax):    1.13 cm AV Area (Vmean):   1.34 cm AV Area (VTI):     0.90 cm AV Vmax:           191.00 cm/s AV Vmean:          115.000 cm/s AV VTI:            0.357 m AV Peak Grad:      14.6 mmHg AV Mean Grad:      6.0 mmHg LVOT Vmax:         68.50 cm/s LVOT Vmean:        49.000 cm/s LVOT VTI:          0.102 m LVOT/AV VTI ratio: 0.29  AORTA Ao Root diam: 3.70 cm MITRAL VALVE               TRICUSPID VALVE MV Area (PHT): 4.54 cm    TR Peak grad:   34.3 mmHg MV Decel Time: 167 msec    TR Vmax:        293.00 cm/s MV E velocity: 84.80 cm/s MV A velocity: 24.90  cm/s  SHUNTS MV E/A ratio:  3.41        Systemic VTI:  0.10 m                            Systemic Diam: 2.00 cm Epifanio Lesches MD Electronically signed by Epifanio Lesches MD Signature Date/Time: 10/29/2023/10:24:48 AM    Final    IR Removal Tun Cv Cath W/O FL  Result Date: 10/28/2023 INDICATION: Patient with a history of ESRD on hemodialysis via right IJ tunneled dialysis catheter. Catheter last exchanged 05/02/22. Patient now admitted with bacteremia that is likely catheter related. IR requested to removed catheter for a line holiday. EXAM: REMOVAL TUNNELED CENTRAL VENOUS CATHETER MEDICATIONS: 1% lidocaine 20 mL ANESTHESIA/SEDATION: none FLUOROSCOPY: none COMPLICATIONS: None immediate. PROCEDURE: Informed written consent was obtained from the patient after a thorough discussion of the procedural risks, benefits and alternatives. All questions were addressed. Maximal Sterile Barrier Technique was utilized including caps, mask, sterile gowns, sterile gloves, sterile drape, hand hygiene and skin antiseptic. A timeout was performed prior to the initiation of the procedure. The patient's right chest and catheter was prepped and draped in a normal sterile  fashion. Heparin was removed from both ports of catheter. 1% lidocaine was used for local anesthesia. Using gentle blunt dissection and moderate manual traction the cuff of the catheter was exposed and the catheter was removed in it's entirety. The catheter had developed a fibrin sheath which was pulled out of the skin when the catheter was removed. The fibrin sheath was removed with scissors. Pressure was held till hemostasis was obtained. A sterile dressing was applied. The patient tolerated the procedure well with no immediate complications. IMPRESSION: Successful catheter removal as described above. Procedure performed by Alwyn Ren NP Electronically Signed   By: Gilmer Mor D.O.   On: 10/28/2023 14:45     LOS: 3 days   Jeoffrey Massed, MD  [facility]    To contact the attending provider between 7A-7P or the covering provider during after hours 7P-7A, please log into the web site [email or web address] and access using universal [place] password for that web site. If you do not have the password, please call the hospital operator.  10/30/2023, 10:17 AM

## 2023-10-30 NOTE — Progress Notes (Addendum)
ID brief note  Remains afebrile.  No labs today   TEE prelim with no vegetations   Repeat blood cx 11/1 and 11/4 NGTD Waiting for new [facility] placement   Plan for 4 weeks of cefazolin with HD from negative blood cx ( 11/1) for complicated MSSA bacteremia, EOT 11/20/23 CBC and BMP weekly on IV abtx  Patient has an appt made with myself on 11/26 at 4 pm at [facility] ID will so, please recall if needed   Correction/addendum  Continue cefazolin for 4 weeks from [facility] removal 11/5. EOT 11/24/23  Odette Fraction, MD Infectious Disease Physician [facility] [address] [place], [place] [address] Phone: [phone]  Fax: [phone]

## 2023-10-30 NOTE — Care Management Important Message (Signed)
Important Message  Patient Details  Name: Caleb Simmons MRN: [id number] Date of Birth: 1951/10/23   Important Message Given:  Yes - Medicare IM     Dorena Bodo 10/30/2023, 2:56 PM

## 2023-10-30 NOTE — Progress Notes (Signed)
PT Cancellation Note  Patient Details Name: Caleb Simmons MRN: [id number] DOB: 12/26/1950   Cancelled Treatment:    Reason Eval/Treat Not Completed: Patient at procedure or test/unavailable (Pt off the floor at TEE. Will follow up if time allows.)   Gladys Damme 10/30/2023, 8:36 AM

## 2023-10-31 ENCOUNTER — Inpatient Hospital Stay (HOSPITAL_COMMUNITY): Payer: Medicare HMO

## 2023-10-31 ENCOUNTER — Encounter (HOSPITAL_COMMUNITY): Payer: Self-pay | Admitting: Cardiology

## 2023-10-31 DIAGNOSIS — R7881 Bacteremia: Secondary | ICD-10-CM | POA: Diagnosis not present

## 2023-10-31 DIAGNOSIS — N186 End stage renal disease: Secondary | ICD-10-CM | POA: Diagnosis not present

## 2023-10-31 DIAGNOSIS — B9561 Methicillin susceptible Staphylococcus aureus infection as the cause of diseases classified elsewhere: Secondary | ICD-10-CM | POA: Diagnosis not present

## 2023-10-31 DIAGNOSIS — Z992 Dependence on renal dialysis: Secondary | ICD-10-CM | POA: Diagnosis not present

## 2023-10-31 HISTORY — PX: IR US GUIDE VASC ACCESS RIGHT: IMG2390

## 2023-10-31 HISTORY — PX: IR FLUORO GUIDE CV LINE RIGHT: IMG2283

## 2023-10-31 LAB — HEPATITIS B SURFACE ANTIBODY, QUANTITATIVE: Hep B S AB Quant (Post): <3.5 m[IU]/mL — ABNORMAL LOW

## 2023-10-31 MED ORDER — FENTANYL CITRATE (PF) 100 MCG/2ML IJ SOLN
INTRAMUSCULAR | Status: AC
  Filled 2023-10-31: qty 2

## 2023-10-31 MED ORDER — MIDAZOLAM HCL 2 MG/2ML IJ SOLN
INTRAMUSCULAR | Status: DC | PRN
  Administered 2023-10-31: 1 mg via INTRAVENOUS
  Administered 2023-10-31: .5 mg via INTRAVENOUS

## 2023-10-31 MED ORDER — HEPARIN SODIUM (PORCINE) 1000 UNIT/ML IJ SOLN
10.0000 mL | Freq: Once | INTRAMUSCULAR | Status: AC
  Administered 2023-10-31: 3.8 mL via INTRAVENOUS

## 2023-10-31 MED ORDER — CEFAZOLIN SODIUM-DEXTROSE 1-4 GM/50ML-% IV SOLN: INTRAVENOUS | Status: DC

## 2023-10-31 MED ORDER — METOPROLOL SUCCINATE ER 25 MG PO TB24
12.5000 mg | ORAL_TABLET | Freq: Every day | ORAL | Status: DC
  Administered 2023-10-31: 12.5 mg via ORAL
  Filled 2023-10-31: qty 1

## 2023-10-31 MED ORDER — CEFAZOLIN SODIUM-DEXTROSE 2-4 GM/100ML-% IV SOLN
INTRAVENOUS | Status: DC | PRN
  Administered 2023-10-31: 2 g via INTRAVENOUS

## 2023-10-31 MED ORDER — LIDOCAINE HCL 1 % IJ SOLN
20.0000 mL | Freq: Once | INTRAMUSCULAR | Status: AC
  Administered 2023-10-31: 20 mL

## 2023-10-31 MED ORDER — FENTANYL CITRATE (PF) 100 MCG/2ML IJ SOLN
INTRAMUSCULAR | Status: DC | PRN
  Administered 2023-10-31: 50 ug via INTRAVENOUS

## 2023-10-31 MED ORDER — HEPARIN SODIUM (PORCINE) 1000 UNIT/ML IJ SOLN
INTRAMUSCULAR | Status: AC
  Filled 2023-10-31: qty 10

## 2023-10-31 MED ORDER — CHLORHEXIDINE GLUCONATE 4 % EX SOLN
CUTANEOUS | Status: AC
  Filled 2023-10-31: qty 15

## 2023-10-31 MED ORDER — CEFAZOLIN SODIUM-DEXTROSE 2-4 GM/100ML-% IV SOLN
INTRAVENOUS | Status: AC
  Filled 2023-10-31: qty 100

## 2023-10-31 MED ORDER — LIDOCAINE HCL 1 % IJ SOLN
INTRAMUSCULAR | Status: AC
  Filled 2023-10-31: qty 20

## 2023-10-31 MED ORDER — MIDAZOLAM HCL 2 MG/2ML IJ SOLN
INTRAMUSCULAR | Status: AC
  Filled 2023-10-31: qty 2

## 2023-10-31 NOTE — Progress Notes (Signed)
Pt is aware that the earliest for hemodialysis today will be at 1800. Dr. Jerral Ralph also aware, and stated as long as pt is okay to discharge home after dialysis, pt can go. This Clinical research associate, primary RN Allisa Einspahr, informed pt, and pt is agreeable to discharge home after dialysis. Pt notified his ride of likely discharge time between 2200-2300, and is okay with that. Pt in NAD at this time.

## 2023-10-31 NOTE — Progress Notes (Signed)
Patient is alert and oriented. Patient refused to stayed until dialysis done after 12 MN. Made MD (Segars) and charge nurse Benetta Spar) aware. Give information about consequences but still insist to go home. Patient signed AMA paper and leave the floor with significant one.

## 2023-10-31 NOTE — Plan of Care (Signed)
  Problem: Education: Goal: Knowledge of General Education information will improve Description: Including pain rating scale, medication(s)/side effects and non-pharmacologic comfort measures Outcome: Progressing   Problem: Health Behavior/Discharge Planning: Goal: Ability to manage health-related needs will improve Outcome: Progressing   Problem: Clinical Measurements: Goal: Ability to maintain clinical measurements within normal limits will improve Outcome: Progressing Goal: Will remain free from infection Outcome: Progressing Goal: Diagnostic test results will improve Outcome: Progressing Goal: Respiratory complications will improve Outcome: Progressing   Problem: Coping: Goal: Level of anxiety will decrease Outcome: Progressing   Problem: Pain Management: Goal: General experience of comfort will improve Outcome: Progressing   Problem: Safety: Goal: Ability to remain free from injury will improve Outcome: Progressing   Problem: Skin Integrity: Goal: Risk for impaired skin integrity will decrease Outcome: Progressing

## 2023-10-31 NOTE — Progress Notes (Signed)
AVS reviewed with pt and pt's significant other [name]. All questions answered at bedside, and both pt and significant other verbalized understanding. [name] will be taking pt home after night hemodialysis. Pt in NAD at this time.

## 2023-10-31 NOTE — Significant Event (Signed)
Notified that patient does not want to wait for his HD session and will plan to go for his next scheduled session on Monday. He is placing himself at increased risk by missing todays session, and will be made against medical advice discharge. Offered him option to stay overnight considering late end time of HD but he declines.   Dolly Rias, MD  [facility]

## 2023-10-31 NOTE — Discharge Summary (Signed)
PATIENT DETAILS Name: Caleb Simmons Age: 72 y.o. Sex: male Date of Birth: 1951-06-20 MRN: [id number]. Admitting Physician: Buena Irish, MD YNW:GNFA, Terrace Arabia, MD  Admit Date: 10/27/2023 Discharge date: 10/31/2023  Recommendations for Outpatient Follow-up:  Follow up with PCP in 1-2 weeks Please obtain CMP/CBC in one week Ancef with hemodialysis-end of treatment 12/2  Admitted From:  Home  Disposition: Home   Discharge Condition: good  CODE STATUS:   Code Status: Prior   Diet recommendation:  Diet Order             Diet NPO time specified Except for: Sips with Meds  Diet effective midnight           Diet - low sodium heart healthy                    Brief Summary: Patient is a 72 y.o.  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   Significant events: 11/4>> admit to [facility]-outpatient blood cultures positive for MSSA   Significant studies: 11/4>> CXR No pneumonia 11/6>> TTE: EF 50-55%, grade 3 diastolic dysfunction 11/7>> TEE: No vegetation   Significant microbiology data: 11/4>> blood culture: No growth   Procedures: None   Consults: Nephrology ID  Brief Hospital Course: MSSA bacteremia Likely associated with HD catheter-which was removed 11/5 by IR Repeat cultures negative so far TEE without vegetations Line holiday given until 11/8-IR will place [facility] for hemodialysis, subsequently will undergo hemodialysis and then be discharged home ID recommending IV Ancef-4 weeks-end of treatment date 12/2.  Discussed with renal team-they will arrange Ancef with hemodialysis.   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   Underweight: Estimated body mass index is 19.87 kg/m as calculated from the following:   Height as of this encounter: 5\' 10"  (1.778 m).   Weight as of this encounter: 62.8 kg.  Discharge Diagnoses:  Principal Problem:   Bacteremia associated with intravascular line (HCC) Active Problems:   MSSA bacteremia   ESRD (end stage renal disease) (HCC)   Bloodstream infection due to central venous catheter   Discharge Instructions:  Activity:  As tolerated   Discharge Instructions     Diet - low sodium heart healthy   Complete by: As directed    Discharge instructions   Complete by: As directed    Follow with Primary MD  [name], MD in 1-2 weeks  Please get a complete blood count and chemistry panel checked by your Primary MD at your next visit, and again as instructed by your Primary MD.  Get Medicines reviewed and adjusted: Please take all your medications with you for your next visit with your Primary MD  Laboratory/radiological data: Please request your Primary MD to go over all hospital tests and procedure/radiological results at the follow up, please ask your Primary MD to get all Hospital records sent to his/her office.  In some cases, they will be blood work, cultures and  biopsy results pending at the time of your discharge. Please request that your primary care M.D. follows up on these results.  Also Note the following: If you experience worsening of your admission symptoms, develop shortness of breath, life threatening emergency, suicidal or homicidal thoughts you must seek medical attention immediately by calling 911 or calling  your MD immediately  if symptoms less severe.  You must read complete instructions/literature along with all the possible adverse reactions/side effects for all the Medicines you take and that have been prescribed to you. Take any new Medicines after you have completely understood and accpet all the possible adverse reactions/side effects.   Do not drive when taking Pain medications or sleeping medications (Benzodaizepines)  Do not take more than prescribed Pain, Sleep and Anxiety Medications. It is not advisable to combine anxiety,sleep and pain medications without talking with your primary care practitioner  Special Instructions: If you have smoked or chewed Tobacco  in the last 2 yrs please stop smoking, stop any regular Alcohol  and or any Recreational drug use.  Wear Seat belts while driving.  Please note: You were cared for by a hospitalist during your hospital stay. Once you are discharged, your primary care physician will handle any further medical issues. Please note that NO REFILLS for any discharge medications will be authorized once you are discharged, as it is imperative that you return to your primary care physician (or establish a relationship with a primary care physician if you do not have one) for your post hospital discharge needs so that they can reassess your need for medications and monitor your lab values.   Increase activity slowly   Complete by: As directed    No dressing needed   Complete by: As directed       Allergies as of 10/31/2023       Reactions   Iron Sucrose Nausea And Vomiting        Medication List     STOP taking these medications    cefdinir 300 MG capsule Commonly known as: OMNICEF   midodrine 5 MG tablet Commonly known as: PROAMATINE       TAKE these medications    acetaminophen 500 MG tablet Commonly known as: TYLENOL Take 1,000 mg by mouth every 6 (six) hours as needed for moderate pain.   ALPRAZolam 1 MG tablet Commonly  known as: XANAX TAKE 1 TABLET BY MOUTH EVERY 8 HOURS AS NEEDED FOR ANXIETY OR SLEEP What changed: See the new instructions.   aspirin EC 81 MG tablet Take 1 tablet (81 mg total) by mouth daily. Swallow whole.   ceFAZolin 1-4 GM/50ML-% Soln Commonly known as: ANCEF IV cefazolin will be dosed with hemodialysis as an outpatient-end of treatment date 11/24/2023   feeding supplement (NEPRO CARB STEADY) Liqd Take 237 mLs by mouth 2 (two) times daily between meals.   ferrous sulfate 325 (65 FE) MG EC tablet Take 1 tablet (325 mg total) by mouth every other day.   fluticasone 50 MCG/ACT nasal spray Commonly known as: FLONASE Place 1 spray into both nostrils daily.   metoprolol succinate 25 MG 24 hr tablet Commonly known as: TOPROL-XL TAKE 1/2 TABLET BY MOUTH EVERY DAY   mirtazapine 30 MG tablet Commonly known as: REMERON TAKE 1 TABLET BY MOUTH AT BEDTIME   oxyCODONE 5 MG immediate release tablet Commonly known as: Oxy IR/ROXICODONE Take 15 mg by mouth every 6 (six) hours as needed for severe pain (pain score 7-10).   pantoprazole 40 MG  tablet Commonly known as: PROTONIX TAKE 1 TABLET BY MOUTH 2 TIMES DAILY BEFORE MEALS What changed: See the new instructions.   Senna Plus 8.6-50 MG tablet Generic drug: senna-docusate Take 1 tablet by mouth daily.   tamsulosin 0.4 MG Caps capsule Commonly known as: FLOMAX TAKE 1 CAPSULE BY MOUTH ONCE DAILY   zolpidem 5 MG tablet Commonly known as: AMBIEN Take 1 tablet (5 mg total) by mouth at bedtime as needed. for sleep               Discharge Care Instructions  (From admission, onward)           Start     Ordered   10/31/23 0000  No dressing needed        10/31/23 1313            Follow-up Information     [name], MD. Schedule an appointment as soon as possible for a visit in 1 week(s).   Specialties: Hematology, Oncology Contact information: [address] [place] [address] [phone]          Hemodialysis clinic Follow up.   Why: Keep your usual schedule               Allergies  Allergen Reactions   Iron Sucrose Nausea And Vomiting     Other Procedures/Studies: ECHO TEE  Result Date: 10/30/2023    TRANSESOPHOGEAL ECHO REPORT   Patient Name:   Caleb BENZIE Date of Exam: 10/30/2023 Medical Rec #:  [id number]    Height:       70.0 in Accession #:    [id number]   Weight:       138.4 lb Date of Birth:  1951/06/12     BSA:          1.785 m Patient Age:    72 years     BP:           189/90 mmHg Patient Gender: M            HR:           83 bpm. Exam Location:  Inpatient Procedure: Transesophageal Echo, Cardiac Doppler, Color Doppler and Saline            Contrast Bubble Study Indications:    Bacteremia  History:        Patient has prior history of Echocardiogram examinations, most                 recent 11/27/2023. CHF, Stroke; Risk Factors:Current Smoker and                 Hypertension.  Sonographer:    Dondra Prader RVT RCS Referring Phys: [id number] Perlie Gold PROCEDURE: After discussion of the risks and benefits of a TEE, an informed consent was obtained from the patient. The transesophogeal probe was passed without difficulty through the esophogus of the patient. Imaged were obtained with the patient in a supine position. Sedation performed by different physician. The patient developed no complications during the procedure.  IMPRESSIONS  1. Left ventricular ejection fraction, by estimation, is 55 to 60%. The left ventricle has normal function. The left ventricle has no regional wall motion abnormalities. There is mild left ventricular hypertrophy.  2. Right ventricular systolic function is normal. The right ventricular size is normal.  3. Left atrial size was moderately dilated. No left atrial/left atrial appendage thrombus was detected. The LAA emptying velocity was 32 cm/s.  4. The mitral valve is  normal in structure. Mild mitral valve regurgitation. No evidence of mitral stenosis.   5. The aortic valve is tricuspid. Aortic valve regurgitation is not visualized. No aortic stenosis is present.  6. There is Moderate (Grade III) layered plaque involving the descending aorta.  7. Agitated saline contrast bubble study was negative, with no evidence of any interatrial shunt. But if clinical suspicion is high consider limited TTE with bubble study. Conclusion(s)/Recommendation(s): No evidence of vegetation/infective endocarditis on this transesophageael echocardiogram. FINDINGS  Left Ventricle: Left ventricular ejection fraction, by estimation, is 55 to 60%. The left ventricle has normal function. The left ventricle has no regional wall motion abnormalities. The left ventricular internal cavity size was normal in size. There is  mild left ventricular hypertrophy. Right Ventricle: The right ventricular size is normal. No increase in right ventricular wall thickness. Right ventricular systolic function is normal. Left Atrium: Left atrial size was moderately dilated. No left atrial/left atrial appendage thrombus was detected. The LAA emptying velocity was 32 cm/s. Right Atrium: Right atrial size was normal in size. Pericardium: There is no evidence of pericardial effusion. Mitral Valve: The mitral valve is normal in structure. Mild mitral valve regurgitation. No evidence of mitral valve stenosis. There is no evidence of mitral valve vegetation. Tricuspid Valve: The tricuspid valve is normal in structure. Tricuspid valve regurgitation is trivial. No evidence of tricuspid stenosis. There is no evidence of tricuspid valve vegetation. Aortic Valve: The aortic valve is tricuspid. Aortic valve regurgitation is not visualized. No aortic stenosis is present. There is no evidence of aortic valve vegetation. Pulmonic Valve: The pulmonic valve was normal in structure. Pulmonic valve regurgitation is not visualized. No evidence of pulmonic stenosis. There is no evidence of pulmonic valve vegetation. Aorta: The aortic  root and ascending aorta are structurally normal, with no evidence of dilitation. There is moderate (Grade III) layered plaque involving the descending aorta. Venous: The left upper pulmonary vein is normal. IAS/Shunts: Agitated saline contrast was given intravenously to evaluate for intracardiac shunting. Agitated saline contrast bubble study was negative, with no evidence of any interatrial shunt. But if clinical suspicion is high consider limited TTE with  bubble study.  LEFT VENTRICLE PLAX 2D LVOT diam:     2.50 cm   Diastology LV SV:         81        LV e' medial:   28.80 cm/s LV SV Index:   45        LV E/e' medial: 1.8 LVOT Area:     4.91 cm  AORTIC VALVE LVOT Vmax:   99.50 cm/s LVOT Vmean:  64.300 cm/s LVOT VTI:    0.164 m  AORTA Ao Root diam: 3.50 cm Ao Asc diam:  3.10 cm MITRAL VALVE MV Area (PHT): 1.98 cm    SHUNTS MV Decel Time: 383 msec    Systemic VTI:  0.16 m MV E velocity: 52.33 cm/s  Systemic Diam: 2.50 cm MV A velocity: 54.60 cm/s MV E/A ratio:  0.96 Sunit Tolia Electronically signed by Tessa Lerner Signature Date/Time: 10/30/2023/4:27:33 PM    Final    EP STUDY  Result Date: 10/30/2023 See surgical note for result.  ECHOCARDIOGRAM COMPLETE  Result Date: 10/29/2023    ECHOCARDIOGRAM REPORT   Patient Name:   Caleb Simmons Date of Exam: 10/29/2023 Medical Rec #:  [id number]    Height:       70.0 in Accession #:    [id number]   Weight:  138.4 lb Date of Birth:  January 07, 1951     BSA:          1.785 m Patient Age:    72 years     BP:           160/85 mmHg Patient Gender: M            HR:           67 bpm. Exam Location:  Inpatient Procedure: 2D Echo, Cardiac Doppler, Color Doppler and Strain Analysis Indications:    Bacteremia  History:        Patient has prior history of Echocardiogram examinations, most                 recent 07/30/2022. CHF, Stroke; Risk Factors:Hypertension and                 Current Smoker.  Sonographer:    Karma Ganja Referring Phys: [id number] DAWOOD Teena Irani  Sonographer  Comments: Global longitudinal strain was attempted. IMPRESSIONS  1. Left ventricular ejection fraction, by estimation, is 50 to 55%. The left ventricle has low normal function. The left ventricle has no regional wall motion abnormalities. There is mild left ventricular hypertrophy. Left ventricular diastolic parameters are consistent with Grade III diastolic dysfunction (restrictive). Elevated left atrial pressure.  2. Right ventricular systolic function is normal. The right ventricular size is normal. There is mildly elevated pulmonary artery systolic pressure. The estimated right ventricular systolic pressure is 42.3 mmHg.  3. Left atrial size was moderately dilated.  4. The mitral valve is normal in structure. Trivial mitral valve regurgitation. No evidence of mitral stenosis.  5. The aortic valve was not well visualized. Aortic valve regurgitation is not visualized. Aortic valve sclerosis/calcification is present, without any evidence of aortic stenosis.  6. The inferior vena cava is dilated in size with >50% respiratory variability, suggesting right atrial pressure of 8 mmHg. Conclusion(s)/Recommendation(s): No clear vegetation seen, but technically difficult study. Consider a transesophageal echocardiogram to exclude infective endocarditis if clinically indicated. FINDINGS  Left Ventricle: Left ventricular ejection fraction, by estimation, is 50 to 55%. The left ventricle has low normal function. The left ventricle has no regional wall motion abnormalities. The left ventricular internal cavity size was normal in size. There is mild left ventricular hypertrophy. Left ventricular diastolic parameters are consistent with Grade III diastolic dysfunction (restrictive). Elevated left atrial pressure. Right Ventricle: The right ventricular size is normal. No increase in right ventricular wall thickness. Right ventricular systolic function is normal. There is mildly elevated pulmonary artery systolic pressure. The  tricuspid regurgitant velocity is 2.93  m/s, and with an assumed right atrial pressure of 8 mmHg, the estimated right ventricular systolic pressure is 42.3 mmHg. Left Atrium: Left atrial size was moderately dilated. Right Atrium: Right atrial size was normal in size. Pericardium: There is no evidence of pericardial effusion. Mitral Valve: The mitral valve is normal in structure. Trivial mitral valve regurgitation. No evidence of mitral valve stenosis. Tricuspid Valve: The tricuspid valve is normal in structure. Tricuspid valve regurgitation is trivial. Aortic Valve: The aortic valve was not well visualized. Aortic valve regurgitation is not visualized. Aortic valve sclerosis/calcification is present, without any evidence of aortic stenosis. Aortic valve mean gradient measures 6.0 mmHg. Aortic valve peak gradient measures 14.6 mmHg. Aortic valve area, by VTI measures 0.90 cm. Pulmonic Valve: The pulmonic valve was not well visualized. Pulmonic valve regurgitation is not visualized. Aorta: The aortic root is normal in size and structure. Venous: The  inferior vena cava is dilated in size with greater than 50% respiratory variability, suggesting right atrial pressure of 8 mmHg. IAS/Shunts: The interatrial septum was not well visualized.  LEFT VENTRICLE PLAX 2D LVIDd:         5.60 cm      Diastology LVIDs:         4.40 cm      LV e' medial:    4.79 cm/s LV PW:         1.10 cm      LV E/e' medial:  17.7 LV IVS:        1.60 cm      LV e' lateral:   7.51 cm/s LVOT diam:     2.00 cm      LV E/e' lateral: 11.3 LV SV:         32 LV SV Index:   18 LVOT Area:     3.14 cm  LV Volumes (MOD) LV vol d, MOD A2C: 108.0 ml LV vol d, MOD A4C: 118.0 ml LV vol s, MOD A2C: 59.1 ml LV vol s, MOD A4C: 58.1 ml LV SV MOD A2C:     48.9 ml LV SV MOD A4C:     118.0 ml LV SV MOD BP:      53.5 ml RIGHT VENTRICLE             IVC RV Basal diam:  3.80 cm     IVC diam: 2.10 cm RV S prime:     12.30 cm/s TAPSE (M-mode): 2.1 cm LEFT ATRIUM              Index        RIGHT ATRIUM           Index LA diam:        4.30 cm 2.41 cm/m   RA Area:     17.30 cm LA Vol (A2C):   95.8 ml 53.66 ml/m  RA Volume:   49.10 ml  27.50 ml/m LA Vol (A4C):   65.3 ml 36.58 ml/m LA Biplane Vol: 86.5 ml 48.45 ml/m  AORTIC VALVE AV Area (Vmax):    1.13 cm AV Area (Vmean):   1.34 cm AV Area (VTI):     0.90 cm AV Vmax:           191.00 cm/s AV Vmean:          115.000 cm/s AV VTI:            0.357 m AV Peak Grad:      14.6 mmHg AV Mean Grad:      6.0 mmHg LVOT Vmax:         68.50 cm/s LVOT Vmean:        49.000 cm/s LVOT VTI:          0.102 m LVOT/AV VTI ratio: 0.29  AORTA Ao Root diam: 3.70 cm MITRAL VALVE               TRICUSPID VALVE MV Area (PHT): 4.54 cm    TR Peak grad:   34.3 mmHg MV Decel Time: 167 msec    TR Vmax:        293.00 cm/s MV E velocity: 84.80 cm/s MV A velocity: 24.90 cm/s  SHUNTS MV E/A ratio:  3.41        Systemic VTI:  0.10 m  Systemic Diam: 2.00 cm Epifanio Lesches MD Electronically signed by Epifanio Lesches MD Signature Date/Time: 10/29/2023/10:24:48 AM    Final    IR Removal Tun Cv Cath W/O FL  Result Date: 10/28/2023 INDICATION: Patient with a history of ESRD on hemodialysis via right IJ tunneled dialysis catheter. Catheter last exchanged 05/02/22. Patient now admitted with bacteremia that is likely catheter related. IR requested to removed catheter for a line holiday. EXAM: REMOVAL TUNNELED CENTRAL VENOUS CATHETER MEDICATIONS: 1% lidocaine 20 mL ANESTHESIA/SEDATION: none FLUOROSCOPY: none COMPLICATIONS: None immediate. PROCEDURE: Informed written consent was obtained from the patient after a thorough discussion of the procedural risks, benefits and alternatives. All questions were addressed. Maximal Sterile Barrier Technique was utilized including caps, mask, sterile gowns, sterile gloves, sterile drape, hand hygiene and skin antiseptic. A timeout was performed prior to the initiation of the procedure. The patient's right  chest and catheter was prepped and draped in a normal sterile fashion. Heparin was removed from both ports of catheter. 1% lidocaine was used for local anesthesia. Using gentle blunt dissection and moderate manual traction the cuff of the catheter was exposed and the catheter was removed in it's entirety. The catheter had developed a fibrin sheath which was pulled out of the skin when the catheter was removed. The fibrin sheath was removed with scissors. Pressure was held till hemostasis was obtained. A sterile dressing was applied. The patient tolerated the procedure well with no immediate complications. IMPRESSION: Successful catheter removal as described above. Procedure performed by Alwyn Ren NP Electronically Signed   By: Gilmer Mor D.O.   On: 10/28/2023 14:45   DG Chest 2 View  Result Date: 10/27/2023 CLINICAL DATA:  Weakness and cough. EXAM: CHEST - 2 VIEW COMPARISON:  Chest radiograph dated 10/24/2023. FINDINGS: Dialysis catheter in similar position. No focal consolidation, pleural effusion, or pneumothorax. The cardiac silhouette is within normal limits. No acute osseous pathology. IMPRESSION: No active cardiopulmonary disease. Electronically Signed   By: Elgie Collard M.D.   On: 10/27/2023 19:37   DG Chest Portable 1 View  Result Date: 10/24/2023 CLINICAL DATA:  Sepsis.  Fever.  Dialysis patient. EXAM: PORTABLE CHEST 1 VIEW COMPARISON:  Radiographs 12/19/2022 and 01/14/2022. FINDINGS: 1452 hours. Right IJ hemodialysis catheter tip extends to the superior cavoatrial junction. The heart size and mediastinal contours are stable. There is vascular congestion without definite edema, focal airspace disease, significant pleural effusion or pneumothorax. The bones appear unchanged, without acute findings. Telemetry leads overlie the chest. IMPRESSION: Vascular congestion without definite edema or focal airspace disease. Electronically Signed   By: Carey Bullocks M.D.   On: 10/24/2023 17:39      TODAY-DAY OF DISCHARGE:  Subjective:   Brita Romp today has no headache,no chest abdominal pain,no new weakness tingling or numbness, feels much better wants to go home today.   Objective:   Blood pressure (!) 180/94, pulse 86, temperature 98 F (36.7 C), temperature source Oral, resp. rate 18, height 5\' 10"  (1.778 m), weight 62.8 kg, SpO2 97%.  Intake/Output Summary (Last 24 hours) at 10/31/2023 1314 Last data filed at 10/31/2023 0606 Gross per 24 hour  Intake 290 ml  Output 775 ml  Net -485 ml   Filed Weights   10/28/23 0855  Weight: 62.8 kg    Exam: Awake Alert, Oriented *3, No new F.N deficits, Normal affect [place].AT,PERRAL Supple Neck,No JVD, No cervical lymphadenopathy appriciated.  Symmetrical Chest wall movement, Good air movement bilaterally, CTAB RRR,No Gallops,Rubs or new Murmurs, No Parasternal Heave +ve B.Sounds,  Abd Soft, Non tender, No organomegaly appriciated, No rebound -guarding or rigidity. No Cyanosis, Clubbing or edema, No new Rash or bruise   PERTINENT RADIOLOGIC STUDIES: ECHO TEE  Result Date: 10/30/2023    TRANSESOPHOGEAL ECHO REPORT   Patient Name:   Caleb Simmons Date of Exam: 10/30/2023 Medical Rec #:  [id number]    Height:       70.0 in Accession #:    [id number]   Weight:       138.4 lb Date of Birth:  04/11/1951     BSA:          1.785 m Patient Age:    72 years     BP:           189/90 mmHg Patient Gender: M            HR:           83 bpm. Exam Location:  Inpatient Procedure: Transesophageal Echo, Cardiac Doppler, Color Doppler and Saline            Contrast Bubble Study Indications:    Bacteremia  History:        Patient has prior history of Echocardiogram examinations, most                 recent 11/27/2023. CHF, Stroke; Risk Factors:Current Smoker and                 Hypertension.  Sonographer:    Dondra Prader RVT RCS Referring Phys: [id number] Perlie Gold PROCEDURE: After discussion of the risks and benefits of a TEE, an informed consent was obtained  from the patient. The transesophogeal probe was passed without difficulty through the esophogus of the patient. Imaged were obtained with the patient in a supine position. Sedation performed by different physician. The patient developed no complications during the procedure.  IMPRESSIONS  1. Left ventricular ejection fraction, by estimation, is 55 to 60%. The left ventricle has normal function. The left ventricle has no regional wall motion abnormalities. There is mild left ventricular hypertrophy.  2. Right ventricular systolic function is normal. The right ventricular size is normal.  3. Left atrial size was moderately dilated. No left atrial/left atrial appendage thrombus was detected. The LAA emptying velocity was 32 cm/s.  4. The mitral valve is normal in structure. Mild mitral valve regurgitation. No evidence of mitral stenosis.  5. The aortic valve is tricuspid. Aortic valve regurgitation is not visualized. No aortic stenosis is present.  6. There is Moderate (Grade III) layered plaque involving the descending aorta.  7. Agitated saline contrast bubble study was negative, with no evidence of any interatrial shunt. But if clinical suspicion is high consider limited TTE with bubble study. Conclusion(s)/Recommendation(s): No evidence of vegetation/infective endocarditis on this transesophageael echocardiogram. FINDINGS  Left Ventricle: Left ventricular ejection fraction, by estimation, is 55 to 60%. The left ventricle has normal function. The left ventricle has no regional wall motion abnormalities. The left ventricular internal cavity size was normal in size. There is  mild left ventricular hypertrophy. Right Ventricle: The right ventricular size is normal. No increase in right ventricular wall thickness. Right ventricular systolic function is normal. Left Atrium: Left atrial size was moderately dilated. No left atrial/left atrial appendage thrombus was detected. The LAA emptying velocity was 32 cm/s. Right  Atrium: Right atrial size was normal in size. Pericardium: There is no evidence of pericardial effusion. Mitral Valve: The mitral valve is normal in structure. Mild mitral valve regurgitation. No evidence  of mitral valve stenosis. There is no evidence of mitral valve vegetation. Tricuspid Valve: The tricuspid valve is normal in structure. Tricuspid valve regurgitation is trivial. No evidence of tricuspid stenosis. There is no evidence of tricuspid valve vegetation. Aortic Valve: The aortic valve is tricuspid. Aortic valve regurgitation is not visualized. No aortic stenosis is present. There is no evidence of aortic valve vegetation. Pulmonic Valve: The pulmonic valve was normal in structure. Pulmonic valve regurgitation is not visualized. No evidence of pulmonic stenosis. There is no evidence of pulmonic valve vegetation. Aorta: The aortic root and ascending aorta are structurally normal, with no evidence of dilitation. There is moderate (Grade III) layered plaque involving the descending aorta. Venous: The left upper pulmonary vein is normal. IAS/Shunts: Agitated saline contrast was given intravenously to evaluate for intracardiac shunting. Agitated saline contrast bubble study was negative, with no evidence of any interatrial shunt. But if clinical suspicion is high consider limited TTE with  bubble study.  LEFT VENTRICLE PLAX 2D LVOT diam:     2.50 cm   Diastology LV SV:         81        LV e' medial:   28.80 cm/s LV SV Index:   45        LV E/e' medial: 1.8 LVOT Area:     4.91 cm  AORTIC VALVE LVOT Vmax:   99.50 cm/s LVOT Vmean:  64.300 cm/s LVOT VTI:    0.164 m  AORTA Ao Root diam: 3.50 cm Ao Asc diam:  3.10 cm MITRAL VALVE MV Area (PHT): 1.98 cm    SHUNTS MV Decel Time: 383 msec    Systemic VTI:  0.16 m MV E velocity: 52.33 cm/s  Systemic Diam: 2.50 cm MV A velocity: 54.60 cm/s MV E/A ratio:  0.96 Sunit Tolia Electronically signed by Tessa Lerner Signature Date/Time: 10/30/2023/4:27:33 PM    Final    EP  STUDY  Result Date: 10/30/2023 See surgical note for result.    PERTINENT LAB RESULTS: CBC: Recent Labs    10/29/23 0354 10/30/23 0602  WBC 7.0 10.2  HGB 7.3* 8.5*  HCT 22.8* 26.6*  PLT 141* 180   CMET CMP     Component Value Date/Time   NA 136 10/29/2023 0354   K 3.5 10/29/2023 0354   CL 104 10/29/2023 0354   CO2 24 10/29/2023 0354   GLUCOSE 114 (H) 10/29/2023 0354   BUN 39 (H) 10/29/2023 0354   CREATININE 4.40 (H) 10/29/2023 0354   CREATININE 7.29 (HH) 08/14/2023 0800   CALCIUM 7.3 (L) 10/29/2023 0354   PROT 5.2 (L) 10/27/2023 1717   ALBUMIN 2.6 (L) 10/27/2023 1717   AST 33 10/27/2023 1717   AST 13 (L) 08/14/2023 0800   ALT 13 10/27/2023 1717   ALT 12 08/14/2023 0800   ALKPHOS 69 10/27/2023 1717   BILITOT 0.5 10/27/2023 1717   BILITOT 0.4 08/14/2023 0800   GFRNONAA 13 (L) 10/29/2023 0354   GFRNONAA 7 (L) 08/14/2023 0800    GFR Estimated Creatinine Clearance: 13.3 mL/min (A) (by C-G formula based on SCr of 4.4 mg/dL (H)). No results for input(s): "LIPASE", "AMYLASE" in the last 72 hours. No results for input(s): "CKTOTAL", "CKMB", "CKMBINDEX", "TROPONINI" in the last 72 hours. Invalid input(s): "POCBNP" No results for input(s): "DDIMER" in the last 72 hours. No results for input(s): "HGBA1C" in the last 72 hours. No results for input(s): "CHOL", "HDL", "LDLCALC", "TRIG", "CHOLHDL", "LDLDIRECT" in the last 72 hours. No results for input(s): "TSH", "  T4TOTAL", "T3FREE", "THYROIDAB" in the last 72 hours.  Invalid input(s): "FREET3" No results for input(s): "VITAMINB12", "FOLATE", "FERRITIN", "TIBC", "IRON", "RETICCTPCT" in the last 72 hours. Coags: No results for input(s): "INR" in the last 72 hours.  Invalid input(s): "PT" Microbiology: Recent Results (from the past 240 hour(s))  Culture, blood (Routine x 2)     Status: None   Collection Time: 10/24/23  2:19 PM   Specimen: BLOOD  Result Value Ref Range Status   Specimen Description BLOOD SITE NOT  SPECIFIED  Final   Special Requests   Final    BOTTLES DRAWN AEROBIC AND ANAEROBIC Blood Culture adequate volume   Culture   Final    NO GROWTH 5 DAYS Performed at [facility], [address] [place], [place] [address]    Report Status 10/29/2023 FINAL  Final  Culture, blood (Routine x 2)     Status: None   Collection Time: 10/24/23  2:32 PM   Specimen: BLOOD RIGHT ARM  Result Value Ref Range Status   Specimen Description BLOOD RIGHT ARM  Final   Special Requests   Final    BOTTLES DRAWN AEROBIC AND ANAEROBIC Blood Culture results may not be optimal due to an excessive volume of blood received in culture bottles   Culture   Final    NO GROWTH 5 DAYS Performed at [facility], [address], [place], [place] [address]    Report Status 10/29/2023 FINAL  Final  Resp panel by RT-PCR (RSV, Flu A&B, Covid) Peripheral     Status: None   Collection Time: 10/24/23  2:41 PM   Specimen: Peripheral; Nasal Swab  Result Value Ref Range Status   SARS Coronavirus 2 by RT PCR NEGATIVE NEGATIVE Final   Influenza A by PCR NEGATIVE NEGATIVE Final   Influenza B by PCR NEGATIVE NEGATIVE Final    Comment: (NOTE) The Xpert Xpress SARS-CoV-2/FLU/RSV plus assay is intended as an aid in the diagnosis of influenza from Nasopharyngeal swab specimens and should not be used as a sole basis for treatment. Nasal washings and aspirates are unacceptable for Xpert Xpress SARS-CoV-2/FLU/RSV testing.  Fact Sheet for Patients: BloggerCourse.com  Fact Sheet for Healthcare Providers: SeriousBroker.it  This test is not yet approved or cleared by the [place] FDA and has been authorized for detection and/or diagnosis of SARS-CoV-2 by FDA under an Emergency Use Authorization (EUA). This EUA will remain in effect (meaning this test can be used) for the duration of the COVID-19 declaration under Section 564(b)(1) of the Act, 21 U.S.C. section  360bbb-3(b)(1), unless the authorization is terminated or revoked.     Resp Syncytial Virus by PCR NEGATIVE NEGATIVE Final    Comment: (NOTE) Fact Sheet for Patients: BloggerCourse.com  Fact Sheet for Healthcare Providers: SeriousBroker.it  This test is not yet approved or cleared by the [place] FDA and has been authorized for detection and/or diagnosis of SARS-CoV-2 by FDA under an Emergency Use Authorization (EUA). This EUA will remain in effect (meaning this test can be used) for the duration of the COVID-19 declaration under Section 564(b)(1) of the Act, 21 U.S.C. section 360bbb-3(b)(1), unless the authorization is terminated or revoked.  Performed at [facility], [address] [place], [place] [address]   Respiratory (~20 pathogens) panel by PCR     Status: None   Collection Time: 10/25/23  8:09 AM   Specimen: Nasopharyngeal Swab; Respiratory  Result Value Ref Range Status   Adenovirus NOT DETECTED NOT DETECTED  Final   Coronavirus 229E NOT DETECTED NOT DETECTED Final    Comment: (NOTE) The Coronavirus on the Respiratory Panel, DOES NOT test for the novel  Coronavirus (2019 nCoV)    Coronavirus HKU1 NOT DETECTED NOT DETECTED Final   Coronavirus NL63 NOT DETECTED NOT DETECTED Final   Coronavirus OC43 NOT DETECTED NOT DETECTED Final   Metapneumovirus NOT DETECTED NOT DETECTED Final   Rhinovirus / Enterovirus NOT DETECTED NOT DETECTED Final   Influenza A NOT DETECTED NOT DETECTED Final   Influenza B NOT DETECTED NOT DETECTED Final   Parainfluenza Virus 1 NOT DETECTED NOT DETECTED Final   Parainfluenza Virus 2 NOT DETECTED NOT DETECTED Final   Parainfluenza Virus 3 NOT DETECTED NOT DETECTED Final   Parainfluenza Virus 4 NOT DETECTED NOT DETECTED Final   Respiratory Syncytial Virus NOT DETECTED NOT DETECTED Final   Bordetella pertussis NOT DETECTED NOT DETECTED Final   Bordetella Parapertussis NOT DETECTED  NOT DETECTED Final   Chlamydophila pneumoniae NOT DETECTED NOT DETECTED Final   Mycoplasma pneumoniae NOT DETECTED NOT DETECTED Final    Comment: Performed at [facility], [address], [place], [place] [address]  Blood culture (routine x 2)     Status: None (Preliminary result)   Collection Time: 10/27/23  5:22 PM   Specimen: BLOOD  Result Value Ref Range Status   Specimen Description BLOOD SITE NOT SPECIFIED  Final   Special Requests   Final    BOTTLES DRAWN AEROBIC AND ANAEROBIC Blood Culture results may not be optimal due to an inadequate volume of blood received in culture bottles   Culture   Final    NO GROWTH 4 DAYS Performed at [facility], [address], [place], [place] [address]    Report Status PENDING  Incomplete  Blood culture (routine x 2)     Status: None (Preliminary result)   Collection Time: 10/27/23  5:28 PM   Specimen: BLOOD LEFT HAND  Result Value Ref Range Status   Specimen Description BLOOD LEFT HAND  Final   Special Requests   Final    BOTTLES DRAWN AEROBIC AND ANAEROBIC Blood Culture adequate volume   Culture   Final    NO GROWTH 4 DAYS Performed at [facility], [address] [place], [place] [address]    Report Status PENDING  Incomplete    FURTHER DISCHARGE INSTRUCTIONS:  Get Medicines reviewed and adjusted: Please take all your medications with you for your next visit with your Primary MD  Laboratory/radiological data: Please request your Primary MD to go over all hospital tests and procedure/radiological results at the follow up, please ask your Primary MD to get all Hospital records sent to his/her office.  In some cases, they will be blood work, cultures and biopsy results pending at the time of your discharge. Please request that your primary care M.D. goes through all the records of your hospital data and follows up on these results.  Also Note the following: If you experience worsening of your admission symptoms,  develop shortness of breath, life threatening emergency, suicidal or homicidal thoughts you must seek medical attention immediately by calling 911 or calling your MD immediately  if symptoms less severe.  You must read complete instructions/literature along with all the possible adverse reactions/side effects for all the Medicines you take and that have been prescribed to you. Take any new Medicines after you have completely understood and accpet all the possible adverse reactions/side effects.   Do not  drive when taking Pain medications or sleeping medications (Benzodaizepines)  Do not take more than prescribed Pain, Sleep and Anxiety Medications. It is not advisable to combine anxiety,sleep and pain medications without talking with your primary care practitioner  Special Instructions: If you have smoked or chewed Tobacco  in the last 2 yrs please stop smoking, stop any regular Alcohol  and or any Recreational drug use.  Wear Seat belts while driving.  Please note: You were cared for by a hospitalist during your hospital stay. Once you are discharged, your primary care physician will handle any further medical issues. Please note that NO REFILLS for any discharge medications will be authorized once you are discharged, as it is imperative that you return to your primary care physician (or establish a relationship with a primary care physician if you do not have one) for your post hospital discharge needs so that they can reassess your need for medications and monitor your lab values.  Total Time spent coordinating discharge including counseling, education and face to face time equals greater than 30 minutes.  SignedJeoffrey Massed 10/31/2023 1:14 PM

## 2023-10-31 NOTE — Plan of Care (Signed)
  Problem: Education: Goal: Knowledge of General Education information will improve Description: Including pain rating scale, medication(s)/side effects and non-pharmacologic comfort measures 10/31/2023 1755 by Rosalio Macadamia, RN Outcome: Adequate for Discharge 10/31/2023 0929 by Rosalio Macadamia, RN Outcome: Progressing   Problem: Health Behavior/Discharge Planning: Goal: Ability to manage health-related needs will improve 10/31/2023 1755 by Rosalio Macadamia, RN Outcome: Adequate for Discharge 10/31/2023 0929 by Rosalio Macadamia, RN Outcome: Progressing   Problem: Clinical Measurements: Goal: Ability to maintain clinical measurements within normal limits will improve 10/31/2023 1755 by Rosalio Macadamia, RN Outcome: Adequate for Discharge 10/31/2023 0929 by Rosalio Macadamia, RN Outcome: Progressing Goal: Will remain free from infection 10/31/2023 1755 by Rosalio Macadamia, RN Outcome: Adequate for Discharge 10/31/2023 0929 by Rosalio Macadamia, RN Outcome: Progressing Goal: Diagnostic test results will improve 10/31/2023 1755 by Rosalio Macadamia, RN Outcome: Adequate for Discharge 10/31/2023 0929 by Rosalio Macadamia, RN Outcome: Progressing Goal: Respiratory complications will improve 10/31/2023 1755 by Rosalio Macadamia, RN Outcome: Adequate for Discharge 10/31/2023 0929 by Rosalio Macadamia, RN Outcome: Progressing Goal: Cardiovascular complication will be avoided 10/31/2023 1755 by Rosalio Macadamia, RN Outcome: Adequate for Discharge 10/31/2023 0929 by Rosalio Macadamia, RN Outcome: Progressing   Problem: Activity: Goal: Risk for activity intolerance will decrease 10/31/2023 1755 by Rosalio Macadamia, RN Outcome: Adequate for Discharge 10/31/2023 0929 by Rosalio Macadamia, RN Outcome: Progressing   Problem: Nutrition: Goal: Adequate nutrition will be maintained 10/31/2023 1755 by Rosalio Macadamia, RN Outcome: Adequate for Discharge 10/31/2023 0929 by Rosalio Macadamia, RN Outcome: Progressing    Problem: Coping: Goal: Level of anxiety will decrease 10/31/2023 1755 by Rosalio Macadamia, RN Outcome: Adequate for Discharge 10/31/2023 0929 by Rosalio Macadamia, RN Outcome: Progressing   Problem: Elimination: Goal: Will not experience complications related to bowel motility 10/31/2023 1755 by Rosalio Macadamia, RN Outcome: Adequate for Discharge 10/31/2023 0929 by Rosalio Macadamia, RN Outcome: Progressing Goal: Will not experience complications related to urinary retention 10/31/2023 1755 by Rosalio Macadamia, RN Outcome: Adequate for Discharge 10/31/2023 0929 by Rosalio Macadamia, RN Outcome: Progressing   Problem: Pain Management: Goal: General experience of comfort will improve 10/31/2023 1755 by Rosalio Macadamia, RN Outcome: Adequate for Discharge 10/31/2023 0929 by Rosalio Macadamia, RN Outcome: Progressing   Problem: Safety: Goal: Ability to remain free from injury will improve 10/31/2023 1755 by Rosalio Macadamia, RN Outcome: Adequate for Discharge 10/31/2023 0929 by Rosalio Macadamia, RN Outcome: Progressing   Problem: Skin Integrity: Goal: Risk for impaired skin integrity will decrease 10/31/2023 1755 by Rosalio Macadamia, RN Outcome: Adequate for Discharge 10/31/2023 0929 by Rosalio Macadamia, RN Outcome: Progressing

## 2023-10-31 NOTE — TOC Transition Note (Signed)
Transition of Care [facility]) - CM/SW Discharge Note   Patient Details  Name: Caleb Simmons MRN: [id number] Date of Birth: Oct 20, 1951  Transition of Care [facility]) CM/SW Contact:  Lawerance Sabal, RN Phone Number: 10/31/2023, 1:33 PM   Clinical Narrative:     Patient to DC to home, no TOC needs identified for DC        Patient Goals and CMS Choice      Discharge Placement                         Discharge Plan and Services Additional resources added to the After Visit Summary for                                       Social Determinants of Health (SDOH) Interventions SDOH Screenings   Food Insecurity: No Food Insecurity (10/28/2023)  Housing: Low Risk  (10/28/2023)  Transportation Needs: No Transportation Needs (10/28/2023)  Utilities: Not At Risk (10/28/2023)  Financial Resource Strain: Low Risk  (09/11/2021)  Tobacco Use: High Risk (10/27/2023)     Readmission Risk Interventions     No data to display

## 2023-10-31 NOTE — Progress Notes (Addendum)
PROGRESS NOTE        PATIENT DETAILS Name: Caleb Simmons Age: 72 y.o. Sex: male Date of Birth: Sep 04, 1951 Admit Date: 10/27/2023 Admitting Physician Buena Irish, MD [id number], Terrace Arabia, MD  Brief Summary: Patient is a 72 y.o.  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  Significant events: 11/4>> admit to [facility]-outpatient blood cultures positive for MSSA  Significant studies: 11/4>> CXR No pneumonia 11/6>> TTE: EF 50-55%, grade 3 diastolic dysfunction 11/7>> TEE: No vegetation  Significant microbiology data: 11/4>> blood culture: No growth  Procedures: None  Consults: Nephrology ID  Subjective: No complaints-really wants to go home today.  Understands he needs [facility] placement-then dialysis-and then if possible home later this evening.  Objective: Vitals: Blood pressure (!) 186/94, pulse 80, temperature 98 F (36.7 C), temperature source Oral, resp. rate 20, height 5\' 10"  (1.778 m), weight 62.8 kg, SpO2 99%.   Exam: Gen Exam:Alert awake-not in any distress HEENT:atraumatic, normocephalic Chest: B/L clear to auscultation anteriorly CVS:S1S2 regular Abdomen:soft non tender, non distended Extremities:no edema Neurology: Non focal Skin: no rash  Pertinent Labs/Radiology:    Latest Ref Rng & Units 10/30/2023    6:02 AM 10/29/2023    3:54 AM 10/27/2023    5:17 PM  CBC  WBC 4.0 - 10.5 K/uL 10.2  7.0  7.5   Hemoglobin 13.0 - 17.0 g/dL 8.5  7.3  8.0   Hematocrit 39.0 - 52.0 % 26.6  22.8  25.3   Platelets 150 - 400 K/uL 180  141  150     Lab Results  Component Value Date   NA 136 10/29/2023   K 3.5 10/29/2023   CL 104 10/29/2023   CO2 24 10/29/2023      Assessment/Plan: MSSA bacteremia Likely associated with HD catheter-which was removed 11/5 by IR Line holiday in progress Repeat cultures negative so far TEE without  vegetations On Ancef-ID recommending continuing Ancef with HD-end of treatment-12/2  ESRD on 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  GERD PPI  BPH Flomax  Anxiety disorder Stable Remeron As needed Xanax  Chronic pain syndrome Resume usual narcotics on discharge.  Underweight: Estimated body mass index is 19.87 kg/m as calculated from the following:   Height as of this encounter: 5\' 10"  (1.778 m).   Weight as of this encounter: 62.8 kg.   Code status:   Code Status: Prior   DVT Prophylaxis: heparin injection 5,000 Units Start: 10/28/23 1400   Family Communication: Significant other at bedside   Disposition Plan: Status is: Inpatient Remains inpatient appropriate because: Severity of illness   Planned Discharge Destination:Home   Diet: Diet Order             Diet NPO time specified Except for: Sips with Meds  Diet effective midnight                     Antimicrobial agents: Anti-infectives (From admission, onward)    Start     Dose/Rate Route Frequency Ordered Stop  10/29/23 2200  cefdinir (OMNICEF) capsule 300 mg  Status:  Discontinued       Note to Pharmacy: Take 300 mg (1 capsule) on Monday after dialysis then again take 300 mg (1 capsule) on Wednesday afternoon     300 mg Oral  Once 10/27/23 2234 10/28/23 1139   10/28/23 1800  ceFAZolin (ANCEF) IVPB 1 g/50 mL premix        1 g 100 mL/hr over 30 Minutes Intravenous Every 24 hours 10/28/23 1139     10/27/23 2125  vancomycin variable dose per unstable renal function (pharmacist dosing)  Status:  Discontinued          Does not apply See admin instructions 10/27/23 2125 10/28/23 1139   10/27/23 2115  vancomycin (VANCOREADY) IVPB 1500 mg/300 mL        1,500 mg 150 mL/hr over 120 Minutes Intravenous  Once 10/27/23 2110 10/27/23 2338        MEDICATIONS: Scheduled Meds:  aspirin EC  81 mg Oral Daily   Chlorhexidine Gluconate Cloth  6 each Topical Daily   Chlorhexidine Gluconate Cloth  6 each Topical Q0600   heparin injection (subcutaneous)  5,000 Units Subcutaneous Q8H   mirtazapine  30 mg Oral QHS   pantoprazole  40 mg Oral BID AC   senna-docusate  1 tablet Oral Daily   tamsulosin  0.4 mg Oral Daily   Continuous Infusions:   ceFAZolin (ANCEF) IV 1 g (10/30/23 1733)   PRN Meds:.acetaminophen, ALPRAZolam, oxyCODONE   I have personally reviewed following labs and imaging studies  LABORATORY DATA: CBC: Recent Labs  Lab 10/24/23 1419 10/25/23 0547 10/26/23 0458 10/27/23 1717 10/29/23 0354 10/30/23 0602  WBC 20.2* 11.4* 9.1 7.5 7.0 10.2  NEUTROABS 19.0*  --   --  5.7  --   --   HGB 7.9* 7.9* 7.4* 8.0* 7.3* 8.5*  HCT 24.7* 23.8* 22.6* 25.3* 22.8* 26.6*  MCV 94.6 93.0 93.0 96.6 93.4 93.7  PLT 145* 129* 131* 150 141* 180    Basic Metabolic Panel: Recent Labs  Lab 10/24/23 1419 10/24/23 2137 10/25/23 0547 10/26/23 0458 10/27/23 1717 10/29/23 0354  NA 133*  --  133* 134* 134* 136  K 3.6  --  3.8 4.4 3.3* 3.5  CL 97*  --  98 97* 99 104  CO2 22  --  23 22 25 24   GLUCOSE 126*  --  103* 109* 117* 114*  BUN 21  --  32* 53* 25* 39*  CREATININE 3.51*  --  4.50* 5.45* 3.35* 4.40*  CALCIUM 7.9*  --  7.7* 7.9* 7.8* 7.3*  MG  --  1.6*  --   --   --   --   PHOS  --   --  4.7*  --   --   --     GFR: Estimated Creatinine Clearance: 13.3 mL/min (A) (by C-G formula based on SCr of 4.4 mg/dL (H)).  Liver Function Tests: Recent Labs  Lab 10/24/23 1419 10/25/23 0547 10/26/23 0458 10/27/23 1717  AST 41  --  37 33  ALT 16  --  13 13  ALKPHOS 72  --  58 69  BILITOT 0.9  --  0.6 0.5   PROT 5.3*  --  4.7* 5.2*  ALBUMIN 2.8* 2.4* 2.2* 2.6*   No results for input(s): "LIPASE", "AMYLASE" in the last 168 hours. No results for input(s): "AMMONIA" in the last 168 hours.  Coagulation Profile: Recent Labs  Lab 10/24/23 1419  INR 1.1  Cardiac Enzymes: No results for input(s): "CKTOTAL", "CKMB", "CKMBINDEX", "TROPONINI" in the last 168 hours.  BNP (last 3 results) No results for input(s): "PROBNP" in the last 8760 hours.  Lipid Profile: No results for input(s): "CHOL", "HDL", "LDLCALC", "TRIG", "CHOLHDL", "LDLDIRECT" in the last 72 hours.  Thyroid Function Tests: No results for input(s): "TSH", "T4TOTAL", "FREET4", "T3FREE", "THYROIDAB" in the last 72 hours.  Anemia Panel: No results for input(s): "VITAMINB12", "FOLATE", "FERRITIN", "TIBC", "IRON", "RETICCTPCT" in the last 72 hours.  Urine analysis:    Component Value Date/Time   COLORURINE YELLOW 10/25/2023 0605   APPEARANCEUR CLEAR 10/25/2023 0605   LABSPEC 1.012 10/25/2023 0605   PHURINE 7.0 10/25/2023 0605   GLUCOSEU NEGATIVE 10/25/2023 0605   HGBUR SMALL (A) 10/25/2023 0605   BILIRUBINUR NEGATIVE 10/25/2023 0605   KETONESUR NEGATIVE 10/25/2023 0605   PROTEINUR >=300 (A) 10/25/2023 0605   UROBILINOGEN 0.2 06/26/2007 1023   NITRITE NEGATIVE 10/25/2023 0605   LEUKOCYTESUR NEGATIVE 10/25/2023 0605    Sepsis Labs: Lactic Acid, Venous    Component Value Date/Time   LATICACIDVEN 1.2 10/27/2023 2054    MICROBIOLOGY: Recent Results (from the past 240 hour(s))  Culture, blood (Routine x 2)     Status: None   Collection Time: 10/24/23  2:19 PM   Specimen: BLOOD  Result Value Ref Range Status   Specimen Description BLOOD SITE NOT SPECIFIED  Final   Special Requests   Final    BOTTLES DRAWN AEROBIC AND ANAEROBIC Blood Culture adequate volume   Culture   Final    NO GROWTH 5 DAYS Performed at [facility], [address], [place], [place] [address]    Report Status 10/29/2023 FINAL  Final   Culture, blood (Routine x 2)     Status: None   Collection Time: 10/24/23  2:32 PM   Specimen: BLOOD RIGHT ARM  Result Value Ref Range Status   Specimen Description BLOOD RIGHT ARM  Final   Special Requests   Final    BOTTLES DRAWN AEROBIC AND ANAEROBIC Blood Culture results may not be optimal due to an excessive volume of blood received in culture bottles   Culture   Final    NO GROWTH 5 DAYS Performed at [facility], [address] [place], [place] [address]    Report Status 10/29/2023 FINAL  Final  Resp panel by RT-PCR (RSV, Flu A&B, Covid) Peripheral     Status: None   Collection Time: 10/24/23  2:41 PM   Specimen: Peripheral; Nasal Swab  Result Value Ref Range Status   SARS Coronavirus 2 by RT PCR NEGATIVE NEGATIVE Final   Influenza A by PCR NEGATIVE NEGATIVE Final   Influenza B by PCR NEGATIVE NEGATIVE Final    Comment: (NOTE) The Xpert Xpress SARS-CoV-2/FLU/RSV plus assay is intended as an aid in the diagnosis of influenza from Nasopharyngeal swab specimens and should not be used as a sole basis for treatment. Nasal washings and aspirates are unacceptable for Xpert Xpress SARS-CoV-2/FLU/RSV testing.  Fact Sheet for Patients: BloggerCourse.com  Fact Sheet for Healthcare Providers: SeriousBroker.it  This test is not yet approved or cleared by the [place] FDA and has been authorized for detection and/or diagnosis of SARS-CoV-2 by FDA under an Emergency Use Authorization (EUA). This EUA will remain in effect (meaning this test can be used) for the duration of the COVID-19 declaration under Section 564(b)(1) of the Act, 21 U.S.C. section 360bbb-3(b)(1), unless the authorization is terminated or revoked.     Resp  Syncytial Virus by PCR NEGATIVE NEGATIVE Final    Comment: (NOTE) Fact Sheet for Patients: BloggerCourse.com  Fact Sheet for Healthcare  Providers: SeriousBroker.it  This test is not yet approved or cleared by the [place] FDA and has been authorized for detection and/or diagnosis of SARS-CoV-2 by FDA under an Emergency Use Authorization (EUA). This EUA will remain in effect (meaning this test can be used) for the duration of the COVID-19 declaration under Section 564(b)(1) of the Act, 21 U.S.C. section 360bbb-3(b)(1), unless the authorization is terminated or revoked.  Performed at [facility], [address], [place], [place] [address]   Respiratory (~20 pathogens) panel by PCR     Status: None   Collection Time: 10/25/23  8:09 AM   Specimen: Nasopharyngeal Swab; Respiratory  Result Value Ref Range Status   Adenovirus NOT DETECTED NOT DETECTED Final   Coronavirus 229E NOT DETECTED NOT DETECTED Final    Comment: (NOTE) The Coronavirus on the Respiratory Panel, DOES NOT test for the novel  Coronavirus (2019 nCoV)    Coronavirus HKU1 NOT DETECTED NOT DETECTED Final   Coronavirus NL63 NOT DETECTED NOT DETECTED Final   Coronavirus OC43 NOT DETECTED NOT DETECTED Final   Metapneumovirus NOT DETECTED NOT DETECTED Final   Rhinovirus / Enterovirus NOT DETECTED NOT DETECTED Final   Influenza A NOT DETECTED NOT DETECTED Final   Influenza B NOT DETECTED NOT DETECTED Final   Parainfluenza Virus 1 NOT DETECTED NOT DETECTED Final   Parainfluenza Virus 2 NOT DETECTED NOT DETECTED Final   Parainfluenza Virus 3 NOT DETECTED NOT DETECTED Final   Parainfluenza Virus 4 NOT DETECTED NOT DETECTED Final   Respiratory Syncytial Virus NOT DETECTED NOT DETECTED Final   Bordetella pertussis NOT DETECTED NOT DETECTED Final   Bordetella Parapertussis NOT DETECTED NOT DETECTED Final   Chlamydophila pneumoniae NOT DETECTED NOT DETECTED Final   Mycoplasma pneumoniae NOT DETECTED NOT DETECTED Final    Comment: Performed at [facility], [address] [place], [place] [address]  Blood culture  (routine x 2)     Status: None (Preliminary result)   Collection Time: 10/27/23  5:22 PM   Specimen: BLOOD  Result Value Ref Range Status   Specimen Description BLOOD SITE NOT SPECIFIED  Final   Special Requests   Final    BOTTLES DRAWN AEROBIC AND ANAEROBIC Blood Culture results may not be optimal due to an inadequate volume of blood received in culture bottles   Culture   Final    NO GROWTH 4 DAYS Performed at [facility], [address] [place] [address], [place], [place] [address]    Report Status PENDING  Incomplete  Blood culture (routine x 2)     Status: None (Preliminary result)   Collection Time: 10/27/23  5:28 PM   Specimen: BLOOD LEFT HAND  Result Value Ref Range Status   Specimen Description BLOOD LEFT HAND  Final   Special Requests   Final    BOTTLES DRAWN AEROBIC AND ANAEROBIC Blood Culture adequate volume   Culture   Final    NO GROWTH 4 DAYS Performed at [facility], [address] [place], [place] [address]    Report Status PENDING  Incomplete    RADIOLOGY STUDIES/RESULTS: ECHO TEE  Result Date: 10/30/2023    TRANSESOPHOGEAL ECHO REPORT   Patient Name:   Caleb Simmons Date of Exam: 10/30/2023 Medical Rec #:  [id number]    Height:       70.0 in Accession #:  [id number]   Weight:       138.4 lb Date of Birth:  11-09-1951     BSA:          1.785 m Patient Age:    72 years     BP:           189/90 mmHg Patient Gender: M            HR:           83 bpm. Exam Location:  Inpatient Procedure: Transesophageal Echo, Cardiac Doppler, Color Doppler and Saline            Contrast Bubble Study Indications:    Bacteremia  History:        Patient has prior history of Echocardiogram examinations, most                 recent 11/27/2023. CHF, Stroke; Risk Factors:Current Smoker and                 Hypertension.  Sonographer:    Dondra Prader RVT RCS Referring Phys: [id number] Perlie Gold PROCEDURE: After discussion of the risks and benefits of a TEE, an informed consent was obtained from the  patient. The transesophogeal probe was passed without difficulty through the esophogus of the patient. Imaged were obtained with the patient in a supine position. Sedation performed by different physician. The patient developed no complications during the procedure.  IMPRESSIONS  1. Left ventricular ejection fraction, by estimation, is 55 to 60%. The left ventricle has normal function. The left ventricle has no regional wall motion abnormalities. There is mild left ventricular hypertrophy.  2. Right ventricular systolic function is normal. The right ventricular size is normal.  3. Left atrial size was moderately dilated. No left atrial/left atrial appendage thrombus was detected. The LAA emptying velocity was 32 cm/s.  4. The mitral valve is normal in structure. Mild mitral valve regurgitation. No evidence of mitral stenosis.  5. The aortic valve is tricuspid. Aortic valve regurgitation is not visualized. No aortic stenosis is present.  6. There is Moderate (Grade III) layered plaque involving the descending aorta.  7. Agitated saline contrast bubble study was negative, with no evidence of any interatrial shunt. But if clinical suspicion is high consider limited TTE with bubble study. Conclusion(s)/Recommendation(s): No evidence of vegetation/infective endocarditis on this transesophageael echocardiogram. FINDINGS  Left Ventricle: Left ventricular ejection fraction, by estimation, is 55 to 60%. The left ventricle has normal function. The left ventricle has no regional wall motion abnormalities. The left ventricular internal cavity size was normal in size. There is  mild left ventricular hypertrophy. Right Ventricle: The right ventricular size is normal. No increase in right ventricular wall thickness. Right ventricular systolic function is normal. Left Atrium: Left atrial size was moderately dilated. No left atrial/left atrial appendage thrombus was detected. The LAA emptying velocity was 32 cm/s. Right Atrium:  Right atrial size was normal in size. Pericardium: There is no evidence of pericardial effusion. Mitral Valve: The mitral valve is normal in structure. Mild mitral valve regurgitation. No evidence of mitral valve stenosis. There is no evidence of mitral valve vegetation. Tricuspid Valve: The tricuspid valve is normal in structure. Tricuspid valve regurgitation is trivial. No evidence of tricuspid stenosis. There is no evidence of tricuspid valve vegetation. Aortic Valve: The aortic valve is tricuspid. Aortic valve regurgitation is not visualized. No aortic stenosis is present. There is no evidence of aortic valve vegetation. Pulmonic Valve: The pulmonic valve was normal in structure.  Pulmonic valve regurgitation is not visualized. No evidence of pulmonic stenosis. There is no evidence of pulmonic valve vegetation. Aorta: The aortic root and ascending aorta are structurally normal, with no evidence of dilitation. There is moderate (Grade III) layered plaque involving the descending aorta. Venous: The left upper pulmonary vein is normal. IAS/Shunts: Agitated saline contrast was given intravenously to evaluate for intracardiac shunting. Agitated saline contrast bubble study was negative, with no evidence of any interatrial shunt. But if clinical suspicion is high consider limited TTE with  bubble study.  LEFT VENTRICLE PLAX 2D LVOT diam:     2.50 cm   Diastology LV SV:         81        LV e' medial:   28.80 cm/s LV SV Index:   45        LV E/e' medial: 1.8 LVOT Area:     4.91 cm  AORTIC VALVE LVOT Vmax:   99.50 cm/s LVOT Vmean:  64.300 cm/s LVOT VTI:    0.164 m  AORTA Ao Root diam: 3.50 cm Ao Asc diam:  3.10 cm MITRAL VALVE MV Area (PHT): 1.98 cm    SHUNTS MV Decel Time: 383 msec    Systemic VTI:  0.16 m MV E velocity: 52.33 cm/s  Systemic Diam: 2.50 cm MV A velocity: 54.60 cm/s MV E/A ratio:  0.96 Sunit Tolia Electronically signed by Tessa Lerner Signature Date/Time: 10/30/2023/4:27:33 PM    Final    EP  STUDY  Result Date: 10/30/2023 See surgical note for result.    LOS: 4 days   Jeoffrey Massed, MD  [facility]    To contact the attending provider between 7A-7P or the covering provider during after hours 7P-7A, please log into the web site [email or web address] and access using universal [place] password for that web site. If you do not have the password, please call the hospital operator.  10/31/2023, 9:57 AM

## 2023-10-31 NOTE — Consult Note (Addendum)
Chief Complaint: Patient was seen in consultation today for tunneled dialysis catheter placement after line holiday secondary to MSSA bacteremia on blood cultures from HD line. IR service requested by Dr. Windell Norfolk  Supervising Physician: Gilmer Mor  Patient Status: [facility] - In-pt  History of Present Illness: Caleb Simmons is a 72 y.o. male  FULL Code patient per chart  PMHx significant for end-stage renal disease on hemodialysis on Mondays and Fridays, amyloidosis, chemo currently on hold, PAD   Patient was discharged  from [facility] on 10/26/23 after being treated for sepsis with pneumonia.  He presented to dialysis on 10/27/23 and was told that his blood cultures on 11/1 were positive for MSSA, and to report back to the hospital.  IV vancomycin given at [facility] and patient was admitted. ID initiated 4 weeks of cefazolin. Dr. Loreta Ave removed patient's right internal jugular tunneled HD cath on 10/28/23, and patient was given a line holiday.   Patient presents today for tunneled HD cath placement. Return precautions and treatment recommendations and follow-up discussed with the patient  who is agreeable with the plan.    Past Medical History:  Diagnosis Date   Cancer [facility])     Past Surgical History:  Procedure Laterality Date   APPENDECTOMY     IR ANGIO INTRA EXTRACRAN SEL COM CAROTID INNOMINATE BILAT MOD SED  01/16/2022   IR ANGIO INTRA EXTRACRAN SEL COM CAROTID INNOMINATE BILAT MOD SED  07/16/2022   IR ANGIO VERTEBRAL SEL SUBCLAVIAN INNOMINATE BILAT MOD SED  01/16/2022   IR ANGIO VERTEBRAL SEL SUBCLAVIAN INNOMINATE UNI R MOD SED  07/16/2022   IR ANGIO VERTEBRAL SEL VERTEBRAL UNI L MOD SED  07/16/2022   IR CT HEAD LTD  01/18/2022   IR EMBO ART  VEN HEMORR LYMPH EXTRAV  INC GUIDE ROADMAPPING  08/30/2021   IR FLUORO GUIDE CV LINE RIGHT  08/30/2021   IR FLUORO GUIDE CV LINE RIGHT  09/18/2021   IR FLUORO GUIDE CV LINE RIGHT  05/02/2022   IR PERCUTANEOUS ART THROMBECTOMY/INFUSION INTRACRANIAL INC  DIAG ANGIO  01/18/2022   IR RADIOLOGIST EVAL & MGMT  08/02/2022   IR REMOVAL TUN CV CATH W/O FL  10/28/2023   IR RENAL SELECTIVE  UNI INC S&I MOD SED  08/31/2021   IR US GUIDE VASC ACCESS RIGHT  08/30/2021   IR US GUIDE VASC ACCESS RIGHT  08/30/2021   IR US GUIDE VASC ACCESS RIGHT  09/18/2021   IR US GUIDE VASC ACCESS RIGHT  01/16/2022   IR US GUIDE VASC ACCESS RIGHT  07/16/2022   IR VENOCAVAGRAM SVC  05/02/2022   RADIOLOGY WITH ANESTHESIA N/A 01/18/2022   Procedure: Cerebral angioplasty with possible stenting;  Surgeon: Julieanne Cotton, MD;  Location: [facility] OR;  Service: Radiology;  Laterality: N/A;   TRANSESOPHAGEAL ECHOCARDIOGRAM (CATH LAB) N/A 10/30/2023   Procedure: TRANSESOPHAGEAL ECHOCARDIOGRAM;  Surgeon: Tessa Lerner, DO;  Location: [facility] INVASIVE CV LAB;  Service: Cardiovascular;  Laterality: N/A;    Allergies: Iron sucrose  Medications: Prior to Admission medications   Medication Sig Start Date End Date Taking? Authorizing Provider  acetaminophen (TYLENOL) 500 MG tablet Take 1,000 mg by mouth every 6 (six) hours as needed for moderate pain.   Yes [provider]  ALPRAZolam (XANAX) 1 MG tablet TAKE 1 TABLET BY MOUTH EVERY 8 HOURS AS NEEDED FOR ANXIETY OR SLEEP Patient taking differently: Take 1 mg by mouth every 8 (eight) hours as needed for sleep. 10/14/23  Yes Pickenpack-Cousar, Arty Baumgartner, NP  aspirin  81 MG EC tablet Take 1 tablet (81 mg total) by mouth daily. Swallow whole. 01/20/22  Yes Almon Hercules, MD  cefdinir (OMNICEF) 300 MG capsule Take 300 mg (1 capsule) on Monday after dialysis then again take 300 mg (1 capsule) on Wednesday afternoon Patient taking differently: Take 300 mg by mouth See admin instructions. Take 300 mg (1 capsule) on Monday after dialysis then again take 300 mg (1 capsule) on Wednesday afternoon 10/26/23  Yes Danford, Earl Lites, MD  fluticasone (FLONASE) 50 MCG/ACT nasal spray Place 1 spray into both nostrils daily.   Yes [provider]   metoprolol succinate (TOPROL-XL) 25 MG 24 hr tablet TAKE 1/2 TABLET BY MOUTH EVERY DAY 08/21/23  Yes Laurey Morale, MD  midodrine (PROAMATINE) 5 MG tablet TAKE 1 TABLET BY MOUTH 3 TIMES DAILY WITH MEALS Patient taking differently: Take 5 mg by mouth 3 (three) times daily with meals. 08/11/23  Yes Laurey Morale, MD  mirtazapine (REMERON) 30 MG tablet TAKE 1 TABLET BY MOUTH AT BEDTIME 10/15/23  Yes Pickenpack-Cousar, Athena N, NP  pantoprazole (PROTONIX) 40 MG tablet TAKE 1 TABLET BY MOUTH 2 TIMES DAILY BEFORE MEALS Patient taking differently: Take 40 mg by mouth 2 (two) times daily before a meal. 10/15/23  Yes [name], MD  SENNA PLUS 8.6-50 MG tablet Take 1 tablet by mouth daily. 07/14/23  Yes [provider]  tamsulosin (FLOMAX) 0.4 MG CAPS capsule TAKE 1 CAPSULE BY MOUTH ONCE DAILY 10/15/23  Yes [name], MD  zolpidem (AMBIEN) 5 MG tablet Take 1 tablet (5 mg total) by mouth at bedtime as needed. for sleep 10/08/23  Yes Pickenpack-Cousar, Arty Baumgartner, NP  ferrous sulfate 325 (65 FE) MG EC tablet Take 1 tablet (325 mg total) by mouth every other day. 10/26/23   Danford, Earl Lites, MD  Nutritional Supplements (FEEDING SUPPLEMENT, NEPRO CARB STEADY,) LIQD Take 237 mLs by mouth 2 (two) times daily between meals. Patient not taking: Reported on 10/27/2023 10/26/23   Alberteen Sam, MD     History reviewed. No pertinent family history.  Social History   Socioeconomic History   Marital status: Married    Spouse name: Not on file   Number of children: Not on file   Years of education: Not on file   Highest education level: Not on file  Occupational History   Not on file  Tobacco Use   Smoking status: Some Days    Current packs/day: 0.25    Average packs/day: 0.3 packs/day for 25.0 years (6.3 ttl pk-yrs)    Types: Cigarettes   Smokeless tobacco: Never  Substance and Sexual Activity   Alcohol use: Not on file   Drug use: Not on file   Sexual activity: Not on file   Other Topics Concern   Not on file  Social History Narrative   Not on file   Social Determinants of Health   Financial Resource Strain: Low Risk  (09/11/2021)   Overall Financial Resource Strain (CARDIA)    Difficulty of Paying Living Expenses: Not very hard  Food Insecurity: No Food Insecurity (10/28/2023)   Hunger Vital Sign    Worried About Running Out of Food in the Last Year: Never true    Ran Out of Food in the Last Year: Never true  Transportation Needs: No Transportation Needs (10/28/2023)   PRAPARE - Administrator, Civil Service (Medical): No    Lack of Transportation (Non-Medical): No  Physical Activity: Not on file  Stress: Not on file  Social Connections: Not on file    Review of Systems: A 12 point ROS discussed and pertinent positives are indicated in the HPI above.  All other systems are negative.  Review of Systems  Constitutional:  Negative for activity change and fever.  Respiratory:  Negative for chest tightness and shortness of breath.   Cardiovascular:  Negative for chest pain.  Psychiatric/Behavioral:  Negative for behavioral problems and confusion.     Vital Signs: BP (!) 186/94 (BP Location: Left Arm)   Pulse 80   Temp 98 F (36.7 C) (Oral)   Resp 20   Ht 5\' 10"  (1.778 m)   Wt 138 lb 7.2 oz (62.8 kg)   SpO2 99%   BMI 19.87 kg/m   Advance Care Plan: The advanced care plan/surrogate decision maker was discussed at the time of visit and documented in the medical record.    Physical Exam Constitutional:      General: He is not in acute distress.    Appearance: Normal appearance.  HENT:     Mouth/Throat:     Mouth: Mucous membranes are moist.  Cardiovascular:     Rate and Rhythm: Normal rate and regular rhythm.  Pulmonary:     Effort: Pulmonary effort is normal. No respiratory distress.  Musculoskeletal:        General: Normal range of motion.  Skin:    General: Skin is warm and dry.  Neurological:     Mental Status: He is  alert and oriented to person, place, and time.  Psychiatric:        Behavior: Behavior normal.        Judgment: Judgment normal.     Imaging: ECHO TEE  Result Date: 10/30/2023    TRANSESOPHOGEAL ECHO REPORT   Patient Name:   AZRIEL MATEEN Date of Exam: 10/30/2023 Medical Rec #:  [id number]    Height:       70.0 in Accession #:    [id number]   Weight:       138.4 lb Date of Birth:  1951-02-09     BSA:          1.785 m Patient Age:    72 years     BP:           189/90 mmHg Patient Gender: M            HR:           83 bpm. Exam Location:  Inpatient Procedure: Transesophageal Echo, Cardiac Doppler, Color Doppler and Saline            Contrast Bubble Study Indications:    Bacteremia  History:        Patient has prior history of Echocardiogram examinations, most                 recent 11/27/2023. CHF, Stroke; Risk Factors:Current Smoker and                 Hypertension.  Sonographer:    Dondra Prader RVT RCS Referring Phys: [id number] Perlie Gold PROCEDURE: After discussion of the risks and benefits of a TEE, an informed consent was obtained from the patient. The transesophogeal probe was passed without difficulty through the esophogus of the patient. Imaged were obtained with the patient in a supine position. Sedation performed by different physician. The patient developed no complications during the procedure.  IMPRESSIONS  1. Left ventricular ejection fraction, by estimation, is 55 to 60%. The left  ventricle has normal function. The left ventricle has no regional wall motion abnormalities. There is mild left ventricular hypertrophy.  2. Right ventricular systolic function is normal. The right ventricular size is normal.  3. Left atrial size was moderately dilated. No left atrial/left atrial appendage thrombus was detected. The LAA emptying velocity was 32 cm/s.  4. The mitral valve is normal in structure. Mild mitral valve regurgitation. No evidence of mitral stenosis.  5. The aortic valve is tricuspid. Aortic valve  regurgitation is not visualized. No aortic stenosis is present.  6. There is Moderate (Grade III) layered plaque involving the descending aorta.  7. Agitated saline contrast bubble study was negative, with no evidence of any interatrial shunt. But if clinical suspicion is high consider limited TTE with bubble study. Conclusion(s)/Recommendation(s): No evidence of vegetation/infective endocarditis on this transesophageael echocardiogram. FINDINGS  Left Ventricle: Left ventricular ejection fraction, by estimation, is 55 to 60%. The left ventricle has normal function. The left ventricle has no regional wall motion abnormalities. The left ventricular internal cavity size was normal in size. There is  mild left ventricular hypertrophy. Right Ventricle: The right ventricular size is normal. No increase in right ventricular wall thickness. Right ventricular systolic function is normal. Left Atrium: Left atrial size was moderately dilated. No left atrial/left atrial appendage thrombus was detected. The LAA emptying velocity was 32 cm/s. Right Atrium: Right atrial size was normal in size. Pericardium: There is no evidence of pericardial effusion. Mitral Valve: The mitral valve is normal in structure. Mild mitral valve regurgitation. No evidence of mitral valve stenosis. There is no evidence of mitral valve vegetation. Tricuspid Valve: The tricuspid valve is normal in structure. Tricuspid valve regurgitation is trivial. No evidence of tricuspid stenosis. There is no evidence of tricuspid valve vegetation. Aortic Valve: The aortic valve is tricuspid. Aortic valve regurgitation is not visualized. No aortic stenosis is present. There is no evidence of aortic valve vegetation. Pulmonic Valve: The pulmonic valve was normal in structure. Pulmonic valve regurgitation is not visualized. No evidence of pulmonic stenosis. There is no evidence of pulmonic valve vegetation. Aorta: The aortic root and ascending aorta are structurally  normal, with no evidence of dilitation. There is moderate (Grade III) layered plaque involving the descending aorta. Venous: The left upper pulmonary vein is normal. IAS/Shunts: Agitated saline contrast was given intravenously to evaluate for intracardiac shunting. Agitated saline contrast bubble study was negative, with no evidence of any interatrial shunt. But if clinical suspicion is high consider limited TTE with  bubble study.  LEFT VENTRICLE PLAX 2D LVOT diam:     2.50 cm   Diastology LV SV:         81        LV e' medial:   28.80 cm/s LV SV Index:   45        LV E/e' medial: 1.8 LVOT Area:     4.91 cm  AORTIC VALVE LVOT Vmax:   99.50 cm/s LVOT Vmean:  64.300 cm/s LVOT VTI:    0.164 m  AORTA Ao Root diam: 3.50 cm Ao Asc diam:  3.10 cm MITRAL VALVE MV Area (PHT): 1.98 cm    SHUNTS MV Decel Time: 383 msec    Systemic VTI:  0.16 m MV E velocity: 52.33 cm/s  Systemic Diam: 2.50 cm MV A velocity: 54.60 cm/s MV E/A ratio:  0.96 Sunit Tolia Electronically signed by Tessa Lerner Signature Date/Time: 10/30/2023/4:27:33 PM    Final    EP STUDY  Result Date:  10/30/2023 See surgical note for result.  ECHOCARDIOGRAM COMPLETE  Result Date: 10/29/2023    ECHOCARDIOGRAM REPORT   Patient Name:   MICKAEL NAPOLES Date of Exam: 10/29/2023 Medical Rec #:  [id number]    Height:       70.0 in Accession #:    [id number]   Weight:       138.4 lb Date of Birth:  [birth year]     BSA:          1.785 m Patient Age:    72 years     BP:           160/85 mmHg Patient Gender: M            HR:           67 bpm. Exam Location:  Inpatient Procedure: 2D Echo, Cardiac Doppler, Color Doppler and Strain Analysis Indications:    Bacteremia  History:        Patient has prior history of Echocardiogram examinations, most                 recent 07/30/2022. CHF, Stroke; Risk Factors:Hypertension and                 Current Smoker.  Sonographer:    Karma Ganja Referring Phys: [id number] DAWOOD Teena Irani  Sonographer Comments: Global longitudinal strain was  attempted. IMPRESSIONS  1. Left ventricular ejection fraction, by estimation, is 50 to 55%. The left ventricle has low normal function. The left ventricle has no regional wall motion abnormalities. There is mild left ventricular hypertrophy. Left ventricular diastolic parameters are consistent with Grade III diastolic dysfunction (restrictive). Elevated left atrial pressure.  2. Right ventricular systolic function is normal. The right ventricular size is normal. There is mildly elevated pulmonary artery systolic pressure. The estimated right ventricular systolic pressure is 42.3 mmHg.  3. Left atrial size was moderately dilated.  4. The mitral valve is normal in structure. Trivial mitral valve regurgitation. No evidence of mitral stenosis.  5. The aortic valve was not well visualized. Aortic valve regurgitation is not visualized. Aortic valve sclerosis/calcification is present, without any evidence of aortic stenosis.  6. The inferior vena cava is dilated in size with >50% respiratory variability, suggesting right atrial pressure of 8 mmHg. Conclusion(s)/Recommendation(s): No clear vegetation seen, but technically difficult study. Consider a transesophageal echocardiogram to exclude infective endocarditis if clinically indicated. FINDINGS  Left Ventricle: Left ventricular ejection fraction, by estimation, is 50 to 55%. The left ventricle has low normal function. The left ventricle has no regional wall motion abnormalities. The left ventricular internal cavity size was normal in size. There is mild left ventricular hypertrophy. Left ventricular diastolic parameters are consistent with Grade III diastolic dysfunction (restrictive). Elevated left atrial pressure. Right Ventricle: The right ventricular size is normal. No increase in right ventricular wall thickness. Right ventricular systolic function is normal. There is mildly elevated pulmonary artery systolic pressure. The tricuspid regurgitant velocity is 2.93  m/s,  and with an assumed right atrial pressure of 8 mmHg, the estimated right ventricular systolic pressure is 42.3 mmHg. Left Atrium: Left atrial size was moderately dilated. Right Atrium: Right atrial size was normal in size. Pericardium: There is no evidence of pericardial effusion. Mitral Valve: The mitral valve is normal in structure. Trivial mitral valve regurgitation. No evidence of mitral valve stenosis. Tricuspid Valve: The tricuspid valve is normal in structure. Tricuspid valve regurgitation is trivial. Aortic Valve: The aortic valve was not well visualized. Aortic valve regurgitation  is not visualized. Aortic valve sclerosis/calcification is present, without any evidence of aortic stenosis. Aortic valve mean gradient measures 6.0 mmHg. Aortic valve peak gradient measures 14.6 mmHg. Aortic valve area, by VTI measures 0.90 cm. Pulmonic Valve: The pulmonic valve was not well visualized. Pulmonic valve regurgitation is not visualized. Aorta: The aortic root is normal in size and structure. Venous: The inferior vena cava is dilated in size with greater than 50% respiratory variability, suggesting right atrial pressure of 8 mmHg. IAS/Shunts: The interatrial septum was not well visualized.  LEFT VENTRICLE PLAX 2D LVIDd:         5.60 cm      Diastology LVIDs:         4.40 cm      LV e' medial:    4.79 cm/s LV PW:         1.10 cm      LV E/e' medial:  17.7 LV IVS:        1.60 cm      LV e' lateral:   7.51 cm/s LVOT diam:     2.00 cm      LV E/e' lateral: 11.3 LV SV:         32 LV SV Index:   18 LVOT Area:     3.14 cm  LV Volumes (MOD) LV vol d, MOD A2C: 108.0 ml LV vol d, MOD A4C: 118.0 ml LV vol s, MOD A2C: 59.1 ml LV vol s, MOD A4C: 58.1 ml LV SV MOD A2C:     48.9 ml LV SV MOD A4C:     118.0 ml LV SV MOD BP:      53.5 ml RIGHT VENTRICLE             IVC RV Basal diam:  3.80 cm     IVC diam: 2.10 cm RV S prime:     12.30 cm/s TAPSE (M-mode): 2.1 cm LEFT ATRIUM             Index        RIGHT ATRIUM           Index LA  diam:        4.30 cm 2.41 cm/m   RA Area:     17.30 cm LA Vol (A2C):   95.8 ml 53.66 ml/m  RA Volume:   49.10 ml  27.50 ml/m LA Vol (A4C):   65.3 ml 36.58 ml/m LA Biplane Vol: 86.5 ml 48.45 ml/m  AORTIC VALVE AV Area (Vmax):    1.13 cm AV Area (Vmean):   1.34 cm AV Area (VTI):     0.90 cm AV Vmax:           191.00 cm/s AV Vmean:          115.000 cm/s AV VTI:            0.357 m AV Peak Grad:      14.6 mmHg AV Mean Grad:      6.0 mmHg LVOT Vmax:         68.50 cm/s LVOT Vmean:        49.000 cm/s LVOT VTI:          0.102 m LVOT/AV VTI ratio: 0.29  AORTA Ao Root diam: 3.70 cm MITRAL VALVE               TRICUSPID VALVE MV Area (PHT): 4.54 cm    TR Peak grad:   34.3 mmHg MV Decel Time: 167 msec  TR Vmax:        293.00 cm/s MV E velocity: 84.80 cm/s MV A velocity: 24.90 cm/s  SHUNTS MV E/A ratio:  3.41        Systemic VTI:  0.10 m                            Systemic Diam: 2.00 cm Epifanio Lesches MD Electronically signed by Epifanio Lesches MD Signature Date/Time: 10/29/2023/10:24:48 AM    Final    IR Removal Tun Cv Cath W/O FL  Result Date: 10/28/2023 INDICATION: Patient with a history of ESRD on hemodialysis via right IJ tunneled dialysis catheter. Catheter last exchanged 05/02/22. Patient now admitted with bacteremia that is likely catheter related. IR requested to removed catheter for a line holiday. EXAM: REMOVAL TUNNELED CENTRAL VENOUS CATHETER MEDICATIONS: 1% lidocaine 20 mL ANESTHESIA/SEDATION: none FLUOROSCOPY: none COMPLICATIONS: None immediate. PROCEDURE: Informed written consent was obtained from the patient after a thorough discussion of the procedural risks, benefits and alternatives. All questions were addressed. Maximal Sterile Barrier Technique was utilized including caps, mask, sterile gowns, sterile gloves, sterile drape, hand hygiene and skin antiseptic. A timeout was performed prior to the initiation of the procedure. The patient's right chest and catheter was prepped and draped in  a normal sterile fashion. Heparin was removed from both ports of catheter. 1% lidocaine was used for local anesthesia. Using gentle blunt dissection and moderate manual traction the cuff of the catheter was exposed and the catheter was removed in it's entirety. The catheter had developed a fibrin sheath which was pulled out of the skin when the catheter was removed. The fibrin sheath was removed with scissors. Pressure was held till hemostasis was obtained. A sterile dressing was applied. The patient tolerated the procedure well with no immediate complications. IMPRESSION: Successful catheter removal as described above. Procedure performed by Alwyn Ren NP Electronically Signed   By: Gilmer Mor D.O.   On: 10/28/2023 14:45   DG Chest 2 View  Result Date: 10/27/2023 CLINICAL DATA:  Weakness and cough. EXAM: CHEST - 2 VIEW COMPARISON:  Chest radiograph dated 10/24/2023. FINDINGS: Dialysis catheter in similar position. No focal consolidation, pleural effusion, or pneumothorax. The cardiac silhouette is within normal limits. No acute osseous pathology. IMPRESSION: No active cardiopulmonary disease. Electronically Signed   By: Elgie Collard M.D.   On: 10/27/2023 19:37   DG Chest Portable 1 View  Result Date: 10/24/2023 CLINICAL DATA:  Sepsis.  Fever.  Dialysis patient. EXAM: PORTABLE CHEST 1 VIEW COMPARISON:  Radiographs 12/19/2022 and 01/14/2022. FINDINGS: 1452 hours. Right IJ hemodialysis catheter tip extends to the superior cavoatrial junction. The heart size and mediastinal contours are stable. There is vascular congestion without definite edema, focal airspace disease, significant pleural effusion or pneumothorax. The bones appear unchanged, without acute findings. Telemetry leads overlie the chest. IMPRESSION: Vascular congestion without definite edema or focal airspace disease. Electronically Signed   By: Carey Bullocks M.D.   On: 10/24/2023 17:39    Labs:  CBC: Recent Labs     10/26/23 0458 10/27/23 1717 10/29/23 0354 10/30/23 0602  WBC 9.1 7.5 7.0 10.2  HGB 7.4* 8.0* 7.3* 8.5*  HCT 22.6* 25.3* 22.8* 26.6*  PLT 131* 150 141* 180    COAGS: Recent Labs    10/24/23 1419  INR 1.1    BMP: Recent Labs    10/25/23 0547 10/26/23 0458 10/27/23 1717 10/29/23 0354  NA 133* 134* 134* 136  K 3.8  4.4 3.3* 3.5  CL 98 97* 99 104  CO2 23 22 25 24   GLUCOSE 103* 109* 117* 114*  BUN 32* 53* 25* 39*  CALCIUM 7.7* 7.9* 7.8* 7.3*  CREATININE 4.50* 5.45* 3.35* 4.40*  GFRNONAA 13* 10* 19* 13*    LIVER FUNCTION TESTS: Recent Labs    10/09/23 0825 10/24/23 1419 10/25/23 0547 10/26/23 0458 10/27/23 1717  BILITOT 0.4 0.9  --  0.6 0.5  AST 13* 41  --  37 33  ALT 8 16  --  13 13  ALKPHOS 81 72  --  58 69  PROT 6.3* 5.3*  --  4.7* 5.2*  ALBUMIN 4.0 2.8* 2.4* 2.2* 2.6*    TUMOR MARKERS: No results for input(s): "AFPTM", "CEA", "CA199", "CHROMGRNA" in the last 8760 hours.  Assessment and Plan:  HGB 8.5, HCT 26.6. All other labs within normal parameters.  R internal jugular tunneled HD cath removed on 10/27/23  Line holiday for MSSA bacteremia on blood cultures from HD cath.   Patient presents for tunneled HD catheter placement Risks and benefits discussed with the patient including, but not limited to bleeding, infection, vascular injury, pneumothorax which may require chest tube placement, air embolism or even death  All of the patient's questions were answered, patient is agreeable to proceed. Consent signed and in chart.   Thank you for this interesting consult.  I greatly enjoyed meeting JAISHAUN HATHORNE and look forward to participating in their care.  A copy of this report was sent to the requesting provider on this date.  Electronically Signed: Sable Feil, PA-C 10/31/2023, 8:42 AM   I spent a total of 20 Minutes  in face to face in clinical consultation, greater than 50% of which was counseling/coordinating care for tunneled HD catheter  placement

## 2023-10-31 NOTE — Procedures (Signed)
Interventional Radiology Procedure Note  Procedure: Tunneled HD Catheter  Indication: Renal Failure  Findings:  Catheter tip is at the cavo-atrial junction. Catheter is ready for sure. Please refer to procedural dictation for full description.  Complications: None  EBL: < 10 mL  Yvett Rossel, MD [phone]   

## 2023-10-31 NOTE — Discharge Planning (Signed)
[facility] Patient Discharge Orders- [facility] CLINIC: [facility]  Patient's name: Caleb Simmons Admit/DC Dates: 10/27/2023 - 10/31/23  Discharge Diagnoses: MSSA bacteremia    Aranesp: Given: no    Last Hgb: 8.4 PRBC's Given: no  ESA dose for discharge: none due to malignancy IV Iron dose at discharge: none due to infection  Heparin change: no  EDW Change: yes New EDW: 63kg  Bath Change: no  Access intervention/Change: yes Details: TDC holiday and replaced by IR on 10/31/23.   Hectorol/Calcitriol change: no  Discharge Labs: Calcium 7.3 Phosphorus 4.7 Albumin 2.6 K+ 3.5  IV Antibiotics: Cefazolin 2g IV qHD until 11/24/23   On Coumadin?: no Last INR: Next INR: Managed By:   OTHER/APPTS/LAB ORDERS:    D/C Meds to be reconciled by nurse after every discharge.  Completed By: Virgina Norfolk, PA-C   Reviewed by: MD:______ RN_______

## 2023-10-31 NOTE — Progress Notes (Signed)
PHARMACY CONSULT NOTE FOR:  OUTPATIENT  PARENTERAL ANTIBIOTIC THERAPY (OPAT)  Indication: MSSA bacteremia Regimen: Cefazolin 2g QHD M-F End date: 11/24/23  No formal OPAT will be completed as patient will be getting antibiotics with HD. Nephrology aware.    Thank you for allowing pharmacy to be a part of this patient's care.  Lennie Muckle, PharmD PGY1 Pharmacy Resident 10/31/2023 8:41 AM

## 2023-10-31 NOTE — Progress Notes (Signed)
D/C order noted. Contacted [facility] to advise clinic of pt's d/c today and that pt should resume care on Monday. Clinic also advised that renal PA to send orders for iv cefazolin at d/c.   Olivia Canter Renal Navigator [phone]

## 2023-10-31 NOTE — Progress Notes (Signed)
[facility] Progress Note   Subjective:   Patient seen and examined at bedside.  Dreading having TDC placed today d/t pain he experienced last time. Admits to headache and hunger.  Denies CP, SOB, abdominal pain and n/v/d.    Objective Vitals:   10/30/23 2147 10/30/23 2358 10/31/23 0433 10/31/23 0807  BP:  (!) 152/76 (!) 165/97 (!) 186/94  Pulse: 78 65 75 80  Resp:    20  Temp: 98.5 F (36.9 C) 98.5 F (36.9 C) 97.8 F (36.6 C) 98 F (36.7 C)  TempSrc: Oral Oral Oral Oral  SpO2: 99% 97% 99%   Weight:      Height:       Physical Exam General:alert, pleasant male in NAD Heart:RRR, no mrg Lungs:CTAB, nml WOB on RA Abdomen:soft, NTND Extremities:no LE edema Dialysis Access: none   Filed Weights   10/28/23 0855  Weight: 62.8 kg    Intake/Output Summary (Last 24 hours) at 10/31/2023 1001 Last data filed at 10/31/2023 0606 Gross per 24 hour  Intake 290 ml  Output 775 ml  Net -485 ml    Additional Objective Labs: Basic Metabolic Panel: Recent Labs  Lab 10/25/23 0547 10/26/23 0458 10/27/23 1717 10/29/23 0354  NA 133* 134* 134* 136  K 3.8 4.4 3.3* 3.5  CL 98 97* 99 104  CO2 23 22 25 24   GLUCOSE 103* 109* 117* 114*  BUN 32* 53* 25* 39*  CREATININE 4.50* 5.45* 3.35* 4.40*  CALCIUM 7.7* 7.9* 7.8* 7.3*  PHOS 4.7*  --   --   --    Liver Function Tests: Recent Labs  Lab 10/24/23 1419 10/25/23 0547 10/26/23 0458 10/27/23 1717  AST 41  --  37 33  ALT 16  --  13 13  ALKPHOS 72  --  58 69  BILITOT 0.9  --  0.6 0.5  PROT 5.3*  --  4.7* 5.2*  ALBUMIN 2.8* 2.4* 2.2* 2.6*   CBC: Recent Labs  Lab 10/24/23 1419 10/25/23 0547 10/26/23 0458 10/27/23 1717 10/29/23 0354 10/30/23 0602  WBC 20.2* 11.4* 9.1 7.5 7.0 10.2  NEUTROABS 19.0*  --   --  5.7  --   --   HGB 7.9* 7.9* 7.4* 8.0* 7.3* 8.5*  HCT 24.7* 23.8* 22.6* 25.3* 22.8* 26.6*  MCV 94.6 93.0 93.0 96.6 93.4 93.7  PLT 145* 129* 131* 150 141* 180   Blood Culture    Component Value  Date/Time   SDES BLOOD LEFT HAND 10/27/2023 1728   SPECREQUEST  10/27/2023 1728    BOTTLES DRAWN AEROBIC AND ANAEROBIC Blood Culture adequate volume   CULT  10/27/2023 1728    NO GROWTH 4 DAYS Performed at [facility], [address] [place], [place] [address]    REPTSTATUS PENDING 10/27/2023 1728    Studies/Results: ECHO TEE  Result Date: 10/30/2023    TRANSESOPHOGEAL ECHO REPORT   Patient Name:   Caleb Simmons Date of Exam: 10/30/2023 Medical Rec #:  [id number]    Height:       70.0 in Accession #:    [id number]   Weight:       138.4 lb Date of Birth:  05-21-1951     BSA:          1.785 m Patient Age:    72 years     BP:           189/90 mmHg Patient Gender: M  HR:           83 bpm. Exam Location:  Inpatient Procedure: Transesophageal Echo, Cardiac Doppler, Color Doppler and Saline            Contrast Bubble Study Indications:    Bacteremia  History:        Patient has prior history of Echocardiogram examinations, most                 recent 11/27/2023. CHF, Stroke; Risk Factors:Current Smoker and                 Hypertension.  Sonographer:    Dondra Prader RVT RCS Referring Phys: [id number] Perlie Gold PROCEDURE: After discussion of the risks and benefits of a TEE, an informed consent was obtained from the patient. The transesophogeal probe was passed without difficulty through the esophogus of the patient. Imaged were obtained with the patient in a supine position. Sedation performed by different physician. The patient developed no complications during the procedure.  IMPRESSIONS  1. Left ventricular ejection fraction, by estimation, is 55 to 60%. The left ventricle has normal function. The left ventricle has no regional wall motion abnormalities. There is mild left ventricular hypertrophy.  2. Right ventricular systolic function is normal. The right ventricular size is normal.  3. Left atrial size was moderately dilated. No left atrial/left atrial appendage thrombus was detected. The LAA  emptying velocity was 32 cm/s.  4. The mitral valve is normal in structure. Mild mitral valve regurgitation. No evidence of mitral stenosis.  5. The aortic valve is tricuspid. Aortic valve regurgitation is not visualized. No aortic stenosis is present.  6. There is Moderate (Grade III) layered plaque involving the descending aorta.  7. Agitated saline contrast bubble study was negative, with no evidence of any interatrial shunt. But if clinical suspicion is high consider limited TTE with bubble study. Conclusion(s)/Recommendation(s): No evidence of vegetation/infective endocarditis on this transesophageael echocardiogram. FINDINGS  Left Ventricle: Left ventricular ejection fraction, by estimation, is 55 to 60%. The left ventricle has normal function. The left ventricle has no regional wall motion abnormalities. The left ventricular internal cavity size was normal in size. There is  mild left ventricular hypertrophy. Right Ventricle: The right ventricular size is normal. No increase in right ventricular wall thickness. Right ventricular systolic function is normal. Left Atrium: Left atrial size was moderately dilated. No left atrial/left atrial appendage thrombus was detected. The LAA emptying velocity was 32 cm/s. Right Atrium: Right atrial size was normal in size. Pericardium: There is no evidence of pericardial effusion. Mitral Valve: The mitral valve is normal in structure. Mild mitral valve regurgitation. No evidence of mitral valve stenosis. There is no evidence of mitral valve vegetation. Tricuspid Valve: The tricuspid valve is normal in structure. Tricuspid valve regurgitation is trivial. No evidence of tricuspid stenosis. There is no evidence of tricuspid valve vegetation. Aortic Valve: The aortic valve is tricuspid. Aortic valve regurgitation is not visualized. No aortic stenosis is present. There is no evidence of aortic valve vegetation. Pulmonic Valve: The pulmonic valve was normal in structure. Pulmonic  valve regurgitation is not visualized. No evidence of pulmonic stenosis. There is no evidence of pulmonic valve vegetation. Aorta: The aortic root and ascending aorta are structurally normal, with no evidence of dilitation. There is moderate (Grade III) layered plaque involving the descending aorta. Venous: The left upper pulmonary vein is normal. IAS/Shunts: Agitated saline contrast was given intravenously to evaluate for intracardiac shunting. Agitated saline contrast bubble study was  negative, with no evidence of any interatrial shunt. But if clinical suspicion is high consider limited TTE with  bubble study.  LEFT VENTRICLE PLAX 2D LVOT diam:     2.50 cm   Diastology LV SV:         81        LV e' medial:   28.80 cm/s LV SV Index:   45        LV E/e' medial: 1.8 LVOT Area:     4.91 cm  AORTIC VALVE LVOT Vmax:   99.50 cm/s LVOT Vmean:  64.300 cm/s LVOT VTI:    0.164 m  AORTA Ao Root diam: 3.50 cm Ao Asc diam:  3.10 cm MITRAL VALVE MV Area (PHT): 1.98 cm    SHUNTS MV Decel Time: 383 msec    Systemic VTI:  0.16 m MV E velocity: 52.33 cm/s  Systemic Diam: 2.50 cm MV A velocity: 54.60 cm/s MV E/A ratio:  0.96 Sunit Tolia Electronically signed by Tessa Lerner Signature Date/Time: 10/30/2023/4:27:33 PM    Final    EP STUDY  Result Date: 10/30/2023 See surgical note for result.   Medications:   ceFAZolin (ANCEF) IV 1 g (10/30/23 1733)    aspirin EC  81 mg Oral Daily   Chlorhexidine Gluconate Cloth  6 each Topical Daily   Chlorhexidine Gluconate Cloth  6 each Topical Q0600   heparin injection (subcutaneous)  5,000 Units Subcutaneous Q8H   metoprolol succinate  12.5 mg Oral Daily   mirtazapine  30 mg Oral QHS   pantoprazole  40 mg Oral BID AC   senna-docusate  1 tablet Oral Daily   tamsulosin  0.4 mg Oral Daily    Dialysis Orders: Center: [facility]  on Mon/Fri. 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 On vancomycin. TDC removed 10/28/23 for line holiday. Plan to be replaced today, followed by HD.  Plan for cefazolin 2g IV qHD (Mon-Fri) unitl 11/24/23.   ESRD:  On HD on Mon/Fri, HD today per regular schedule after [facility] placed.   Hypertension/volume: BP is elevated. Appears he is on midodrine TID, holding for now. Metoprolol 12.5mg  started. Does not appear volume overloaded.  UF as tolerated.    Anemia: Hgb^8.5. 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 Reportedly had a positive FOBT last admission, monitor Hgb and transfuse PRN.   Metabolic bone disease: Corrected calcium at goal. Phos at goal. Continue VDRA  Nutrition:  Poor PO intake due to hospital visits, appetite is good. Continue renal diet.  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 Reports stable so far.  Dipso - plan for d/c post HD today.  Stable from renal standpoint  Virgina Norfolk, PA-C [facility] 10/31/2023,10:01 AM  LOS: 4 days

## 2023-10-31 NOTE — Plan of Care (Signed)

## 2023-11-01 LAB — CULTURE, BLOOD (ROUTINE X 2)
Culture: NO GROWTH
Culture: NO GROWTH
Special Requests: ADEQUATE

## 2023-11-04 ENCOUNTER — Telehealth: Payer: Self-pay

## 2023-11-04 ENCOUNTER — Other Ambulatory Visit: Payer: Self-pay

## 2023-11-04 ENCOUNTER — Other Ambulatory Visit: Payer: Self-pay | Admitting: Hematology

## 2023-11-04 ENCOUNTER — Encounter: Payer: Self-pay | Admitting: Hematology

## 2023-11-04 MED ORDER — OXYCODONE HCL 5 MG PO TABS
15.0000 mg | ORAL_TABLET | Freq: Four times a day (QID) | ORAL | 0 refills | Status: DC | PRN
  Filled 2023-11-04: qty 30, 3d supply, fill #0

## 2023-11-04 NOTE — Telephone Encounter (Signed)
Pt's significant other LVM stating pt was recently d/c from hospital and hospitalist instructed pt to f/u with Dr. Mosetta Putt within the next week.  Sent scheduling message to Dr. Latanya Maudlin scheduler Patricia Nettle regarding f/u appt w/Dr. Mosetta Putt or Vincent Gros, NP.

## 2023-11-06 ENCOUNTER — Telehealth: Payer: Self-pay | Admitting: Hematology

## 2023-11-07 ENCOUNTER — Telehealth: Payer: Self-pay

## 2023-11-07 NOTE — Telephone Encounter (Signed)
Pt daughter called and LVM asking RN to reach out to pt and SO regarding pt symptoms. RN called Caleb Simmons, SO, as pt was in dialysis. Caleb Simmons reported pt being sluggish and weak this morning and "not himself". Caleb Simmons reported pt was awake and walking with assistance, following directions but sleeping more and not eating. RN explained the possible need for dialysis affecting pt energy and that while the pt was at the dialysis center he should be checked by provider. Pt SO verbalized understanding and hung up the phone to call the dialysis center to ask for pt to be seen. RN informed pt that RN will call back after dialysis appt to check back in. Caleb Simmons verbalized understanding, no further needs at this time.

## 2023-11-07 NOTE — Telephone Encounter (Signed)
This RN called back to check in with pt, Caleb Simmons, SO reported he was feeling better and "acting more like himself" though pretty weak still. Appointment on Tuesday with medical oncology and palliative, nancy aware of times. Pt or family to call back with any questions or concerns.

## 2023-11-09 NOTE — Assessment & Plan Note (Addendum)
-  lamda light chain disease with renal, cardiac and neuro involvement   -diagnosed in 08/2021 -not a candidate for transplant  --He began weekly oral Cytoxan, dexa and Velcade injection while inpatient on 09/10/21.  He tolerated well. -He began daratumumab injection 09/27/21. Chemo was held briefly following stroke 01/14/22. -not a candidate for biphosphonate due to renal failure -he completed 6 cycle induction chemo with CyBorD on 03/21/22 and moved to monthly Dara maintenance on 03/28/22. He completed a total of 2 year therapy in 08/2023 -He has had very good partial response to treatment based on his M protein and light chain level. -continued to feel progressively weak after completing daratumumab. He was recently hospitalized from 10/24/2023 through 10/26/2023 due to  fever and suspected sepsis. He had severe anemia. Hemoglobin was as low as 7.3. He had leukocytosis upon admission. Chest x-ray ws concerning for infiltrates. Was discharged after empiric antibiotics administered and he was feeling stronger. Was hospitalized  a second time from 10/27/2023 through 10/31/2023. Blood cultures, done during first hospitalization came back positive for MRSA.

## 2023-11-09 NOTE — Progress Notes (Signed)
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  He tolerated well. -He began daratumumab injection 09/27/21. Chemo was held briefly following stroke 01/14/22. -not a candidate for biphosphonate due to renal failure -he completed 6 cycle induction chemo with CyBorD on 03/21/22 and moved to monthly Dara maintenance on 03/28/22. He completed a total of 2 year therapy in 08/2023 -He has had very good partial response to treatment based on his M protein and light chain level. -continued to feel progressively weak after completing daratumumab. He was recently hospitalized from 10/24/2023 through 10/26/2023 due to  fever and suspected sepsis. He had severe anemia. Hemoglobin was as low as 7.3. He had leukocytosis upon admission. Chest x-ray ws concerning for infiltrates. Was discharged after empiric antibiotics administered and he was feeling stronger. Was hospitalized  a second time from 10/27/2023 through 10/31/2023. Blood cultures, done during first hospitalization came back positive for MRSA.       Plan: Labs reviewed  -CBC showing WBC 6.4; Hgb 7.5; Hct 23.9; Plt 179; Anc 4.4 -CMP - K 4.1; glucose 118; BUN 34; Creatinine 5.45; eGFR 10; Ca 8.4; AST 8; ALT<5; ALK pH OS 85. -Reviewed hospitalization course with the patient and his wife. -Patient scheduled for blood transfusion tomorrow. -Going to hemodialysis on Mondays and Fridays.  Getting IV Ancef during  hemodialysis treatments. -Labs, follow-up with palliative care and clinical follow-up as currently scheduled. -Patient and family member understand they can contact clinic for unresolved or worsening symptoms.  The patient understands the plans discussed today and is in agreement with them.  He knows to contact our office if he develops concerns prior to his next appointment.  I provided 25 minutes of face-to-face time during this encounter and > 50% was spent counseling as documented under my assessment and plan.    Carlean Jews, NP  [facility] - A DEPT OF [facility] [address] [place] [address] Dept: [phone] Dept Fax: [phone]   No orders of the defined types were placed in this encounter.     CHIEF COMPLAINT:  CC: AL amyloidosis  Current Treatment:  surveillance   INTERVAL HISTORY:  Caleb Simmons is here today for repeat clinical assessment. He last saw Dr. Mosetta Putt on 10/09/2023. He was recently hospitalized from 10/24/2023 through 10/26/2023 due to  fever and suspected sepsis. He had severe anemia. Hemoglobin was as low as 7.3. He had leukocytosis upon admission. Chest x-ray ws concerning for infiltrates. Was discharged after empiric antibiotics administered and he was feeling stronger. Was hospitalized  a second time from 10/27/2023 through 10/31/2023. Blood cultures, done during first hospitalization came back positive for MRSA. He denies fevers or chills. He denies pain. His appetite is good. His weight has increased 5 pounds over last 2 weeks .  I have reviewed the past medical history, past surgical history, social history and family history with the patient and they are unchanged from previous note.  ALLERGIES:  is  allergic to iron sucrose.  MEDICATIONS:  Current Outpatient Medications  Medication Sig Dispense Refill   acetaminophen (TYLENOL) 500 MG tablet Take 1,000 mg by mouth every 6 (six) hours  as needed for moderate pain.     ALPRAZolam (XANAX) 1 MG tablet TAKE 1 TABLET BY MOUTH EVERY 8 HOURS AS NEEDED FOR ANXIETY OR SLEEP (Patient taking differently: Take 1 mg by mouth every 8 (eight) hours as needed for sleep.) 90 tablet 0   aspirin 81 MG EC tablet Take 1 tablet (81 mg total) by mouth daily. Swallow whole. 30 tablet 11   ceFAZolin (ANCEF) 1-4 GM/50ML-% SOLN IV cefazolin will be dosed with hemodialysis as an outpatient-end of treatment date 11/24/2023     ferrous sulfate 325 (65 FE) MG EC tablet Take 1 tablet (325 mg total) by mouth every other day.     fluticasone (FLONASE) 50 MCG/ACT nasal spray Place 1 spray into both nostrils daily.     metoprolol succinate (TOPROL-XL) 25 MG 24 hr tablet TAKE 1/2 TABLET BY MOUTH EVERY DAY 15 tablet 4   mirtazapine (REMERON) 30 MG tablet TAKE 1 TABLET BY MOUTH AT BEDTIME 30 tablet 1   oxyCODONE (ROXICODONE) 15 MG immediate release tablet Take 1 tablet (15 mg total) by mouth every 4 (four) hours as needed for severe pain (pain score 7-10). 90 tablet 0   zolpidem (AMBIEN) 5 MG tablet Take 1 tablet (5 mg total) by mouth at bedtime as needed. for sleep 30 tablet 0   folic acid (FOLVITE) 1 MG tablet TAKE 2 TABLETS BY MOUTH ONCE DAILY 60 tablet 0   Nutritional Supplements (FEEDING SUPPLEMENT, NEPRO CARB STEADY,) LIQD Take 237 mLs by mouth 2 (two) times daily between meals. (Patient not taking: Reported on 10/27/2023)     pantoprazole (PROTONIX) 40 MG tablet TAKE 1 TABLET BY MOUTH 2 TIMES DAILY BEFORE MEALS 60 tablet 0   SENNA PLUS 8.6-50 MG tablet Take 1 tablet by mouth daily. (Patient not taking: Reported on 11/11/2023)     tamsulosin (FLOMAX) 0.4 MG CAPS capsule TAKE 1 CAPSULE BY MOUTH ONCE DAILY 30 capsule 0   No current facility-administered medications for this visit.   Facility-Administered Medications Ordered in Other Visits  Medication Dose Route Frequency Provider Last Rate Last Admin   0.9 %  sodium chloride infusion (Manually program via  Guardrails IV Fluids)  250 mL Intravenous Once [name], MD       heparin lock flush 100 unit/mL  500 Units Intracatheter Once [name], MD       sodium chloride flush (NS) 0.9 % injection 10 mL  10 mL Intracatheter Once [name], MD           REVIEW OF SYSTEMS:   Constitutional: Denies fevers, chills or abnormal weight loss.  Moderate fatigue Eyes: Denies blurriness of vision Ears, nose, mouth, throat, and face: Denies mucositis or sore throat Respiratory: Denies cough, dyspnea or wheezes Cardiovascular: Denies palpitation, chest discomfort or lower extremity swelling Gastrointestinal:  Denies nausea, heartburn or change in bowel habits Skin: Denies abnormal skin rashes Lymphatics: Denies new lymphadenopathy or easy bruising Neurological:Denies numbness, tingling or new weaknesses Behavioral/Psych: Mood is stable, no new changes  All other systems were reviewed with the patient and are negative.   VITALS:   Today's Vitals   11/11/23 1119 11/11/23 1139  BP: 131/73   Pulse: 78   Resp: 18   Temp: 98.4 F (36.9 C)   TempSrc: Oral   SpO2: 99%   Weight:  143 lb 11.2 oz (65.2 kg)   PainSc:  0-No pain   Body mass index is 20.62 kg/m.   Wt Readings from Last 3 Encounters:  11/11/23 143 lb 11.2 oz (65.2 kg)  10/28/23 138 lb 7.2 oz (62.8 kg)  10/25/23 138 lb 7.2 oz (62.8 kg)    Body mass index is 20.62 kg/m.  Performance status (ECOG): 2 - Symptomatic, <50% confined to bed  PHYSICAL EXAM:   GENERAL:alert, no distress and comfortable.  Appears fatigued and weak. SKIN: skin color, texture, turgor are normal, no rashes or significant lesions EYES: normal, Conjunctiva are pink and non-injected, sclera clear OROPHARYNX:no exudate, no erythema and lips, buccal mucosa, and tongue normal  NECK: supple, thyroid normal size, non-tender, without nodularity LYMPH:  no palpable lymphadenopathy in the cervical, axillary or inguinal LUNGS: clear to auscultation and percussion with  normal breathing effort HEART: regular rate & rhythm and no murmurs and no lower extremity edema ABDOMEN:abdomen soft, non-tender and normal bowel sounds Musculoskeletal:no cyanosis of digits and no clubbing  NEURO: alert & oriented x 3 with fluent speech, no focal motor/sensory deficits  LABORATORY DATA:  I have reviewed the data as listed    Component Value Date/Time   NA 137 11/11/2023 1050   K 4.1 11/11/2023 1050   CL 100 11/11/2023 1050   CO2 29 11/11/2023 1050   GLUCOSE 118 (H) 11/11/2023 1050   BUN 34 (H) 11/11/2023 1050   CREATININE 5.45 (H) 11/11/2023 1050   CREATININE 7.29 (HH) 08/14/2023 0800   CALCIUM 8.4 (L) 11/11/2023 1050   PROT 6.0 (L) 11/11/2023 1050   ALBUMIN 3.4 (L) 11/11/2023 1050   AST 8 (L) 11/11/2023 1050   AST 13 (L) 08/14/2023 0800   ALT <5 11/11/2023 1050   ALT 12 08/14/2023 0800   ALKPHOS 85 11/11/2023 1050   BILITOT 0.3 11/11/2023 1050   BILITOT 0.4 08/14/2023 0800   GFRNONAA 10 (L) 11/11/2023 1050   GFRNONAA 7 (L) 08/14/2023 0800    Lab Results  Component Value Date   WBC 6.4 11/11/2023   NEUTROABS 4.4 11/11/2023   HGB 7.5 (L) 11/11/2023   HCT 23.9 (L) 11/11/2023   MCV 97.6 11/11/2023   PLT 179 11/11/2023       RADIOGRAPHIC STUDIES: IR Fluoro Guide CV Line Right  Result Date: 10/31/2023 INDICATION: 72 year old woman with chronic renal failure had right IJ tunneled hemodialysis catheter removed on 10/28/2023 for bacteremia. He returns today for new catheter placement. Ultrasound evaluation prior to the procedure shows the right internal jugular vein to be occluded. The right external jugular vein is patent. EXAM: Ultrasound and fluoroscopy guided tunneled hemodialysis catheter placement MEDICATIONS: Ancef 2 g IV; The antibiotic was administered within an appropriate time interval prior to skin puncture. ANESTHESIA/SEDATION: Moderate (conscious) sedation was employed during this procedure. A total of Versed 1.5 mg and Fentanyl 50 mcg was  administered intravenously by the radiology nurse. Total intra-service moderate Sedation Time: 11 minutes. The patient's level of consciousness and vital signs were monitored continuously by radiology nursing throughout the procedure under my direct supervision. FLUOROSCOPY: Radiation Exposure Index (as provided by the fluoroscopic device): 9 mGy Kerma COMPLICATIONS: None immediate. PROCEDURE: Informed written consent was obtained from the patient after a thorough discussion of the procedural risks, benefits and alternatives. All questions were addressed. Maximal Sterile Barrier Technique was utilized including caps, mask, sterile gowns, sterile gloves, sterile drape, hand hygiene and skin antiseptic. A timeout was performed prior to the initiation of the procedure. The right  external jugular vein was evaluated with ultrasound and shown to be patent. A permanent ultrasound image was obtained and placed in the patient's medical record. Using sterile gel and a sterile probe cover, the right external jugular vein was entered with a 21 ga needle during real time ultrasound guidance. 0.018 inch guidewire placed and 21 ga needle exchanged for transitional dilator set. Utilizing fluoroscopy, 0.035 inch guidewire advanced through the dilator without difficulty. Seriel dilation was performed and peel-away sheath was placed. Attention then turned to the right anterior upper chest. Following local lidocaine administration, the 19 cm hemodialysis catheter was tunneled from the chest wall to the venotomy site. The catheter was inserted through the peel-away sheath. Unfortunately the tip only reached the superior vena cava. The existing 19 cm catheter was removed over 0.035 inch stiff Glidewire and replaced with a 23 cm tunneled hemodialysis catheter. The tip of the catheter was positioned within the right atrium using fluoroscopic guidance. All lumens of the catheter aspirated and flushed well. The dialysis lumens were locked  with Heparin. The catheter was secured to the skin with suture. The insertion site was covered with sterile dressing. IMPRESSION: Successful insertion of right external jugular vein tunneled hemodialysis catheter (23 cm). Catheter is ready for use. Electronically Signed   By: Acquanetta Belling M.D.   On: 10/31/2023 15:21   IR US Guide Vasc Access Right  Result Date: 10/31/2023 INDICATION: 72 year old woman with chronic renal failure had right IJ tunneled hemodialysis catheter removed on 10/28/2023 for bacteremia. He returns today for new catheter placement. Ultrasound evaluation prior to the procedure shows the right internal jugular vein to be occluded. The right external jugular vein is patent. EXAM: Ultrasound and fluoroscopy guided tunneled hemodialysis catheter placement MEDICATIONS: Ancef 2 g IV; The antibiotic was administered within an appropriate time interval prior to skin puncture. ANESTHESIA/SEDATION: Moderate (conscious) sedation was employed during this procedure. A total of Versed 1.5 mg and Fentanyl 50 mcg was administered intravenously by the radiology nurse. Total intra-service moderate Sedation Time: 11 minutes. The patient's level of consciousness and vital signs were monitored continuously by radiology nursing throughout the procedure under my direct supervision. FLUOROSCOPY: Radiation Exposure Index (as provided by the fluoroscopic device): 9 mGy Kerma COMPLICATIONS: None immediate. PROCEDURE: Informed written consent was obtained from the patient after a thorough discussion of the procedural risks, benefits and alternatives. All questions were addressed. Maximal Sterile Barrier Technique was utilized including caps, mask, sterile gowns, sterile gloves, sterile drape, hand hygiene and skin antiseptic. A timeout was performed prior to the initiation of the procedure. The right external jugular vein was evaluated with ultrasound and shown to be patent. A permanent ultrasound image was obtained and  placed in the patient's medical record. Using sterile gel and a sterile probe cover, the right external jugular vein was entered with a 21 ga needle during real time ultrasound guidance. 0.018 inch guidewire placed and 21 ga needle exchanged for transitional dilator set. Utilizing fluoroscopy, 0.035 inch guidewire advanced through the dilator without difficulty. Seriel dilation was performed and peel-away sheath was placed. Attention then turned to the right anterior upper chest. Following local lidocaine administration, the 19 cm hemodialysis catheter was tunneled from the chest wall to the venotomy site. The catheter was inserted through the peel-away sheath. Unfortunately the tip only reached the superior vena cava. The existing 19 cm catheter was removed over 0.035 inch stiff Glidewire and replaced with a 23 cm tunneled hemodialysis catheter. The tip of the catheter was positioned within  the right atrium using fluoroscopic guidance. All lumens of the catheter aspirated and flushed well. The dialysis lumens were locked with Heparin. The catheter was secured to the skin with suture. The insertion site was covered with sterile dressing. IMPRESSION: Successful insertion of right external jugular vein tunneled hemodialysis catheter (23 cm). Catheter is ready for use. Electronically Signed   By: Acquanetta Belling M.D.   On: 10/31/2023 15:21   ECHO TEE  Result Date: 10/30/2023    TRANSESOPHOGEAL ECHO REPORT   Patient Name:   Caleb Simmons Date of Exam: 10/30/2023 Medical Rec #:  [id number]    Height:       70.0 in Accession #:    [id number]   Weight:       138.4 lb Date of Birth:  09/17/1951     BSA:          1.785 m Patient Age:    72 years     BP:           189/90 mmHg Patient Gender: M            HR:           83 bpm. Exam Location:  Inpatient Procedure: Transesophageal Echo, Cardiac Doppler, Color Doppler and Saline            Contrast Bubble Study Indications:    Bacteremia  History:        Patient has prior history of  Echocardiogram examinations, most                 recent 11/27/2023. CHF, Stroke; Risk Factors:Current Smoker and                 Hypertension.  Sonographer:    Dondra Prader RVT RCS Referring Phys: [id number] Perlie Gold PROCEDURE: After discussion of the risks and benefits of a TEE, an informed consent was obtained from the patient. The transesophogeal probe was passed without difficulty through the esophogus of the patient. Imaged were obtained with the patient in a supine position. Sedation performed by different physician. The patient developed no complications during the procedure.  IMPRESSIONS  1. Left ventricular ejection fraction, by estimation, is 55 to 60%. The left ventricle has normal function. The left ventricle has no regional wall motion abnormalities. There is mild left ventricular hypertrophy.  2. Right ventricular systolic function is normal. The right ventricular size is normal.  3. Left atrial size was moderately dilated. No left atrial/left atrial appendage thrombus was detected. The LAA emptying velocity was 32 cm/s.  4. The mitral valve is normal in structure. Mild mitral valve regurgitation. No evidence of mitral stenosis.  5. The aortic valve is tricuspid. Aortic valve regurgitation is not visualized. No aortic stenosis is present.  6. There is Moderate (Grade III) layered plaque involving the descending aorta.  7. Agitated saline contrast bubble study was negative, with no evidence of any interatrial shunt. But if clinical suspicion is high consider limited TTE with bubble study. Conclusion(s)/Recommendation(s): No evidence of vegetation/infective endocarditis on this transesophageael echocardiogram. FINDINGS  Left Ventricle: Left ventricular ejection fraction, by estimation, is 55 to 60%. The left ventricle has normal function. The left ventricle has no regional wall motion abnormalities. The left ventricular internal cavity size was normal in size. There is  mild left ventricular  hypertrophy. Right Ventricle: The right ventricular size is normal. No increase in right ventricular wall thickness. Right ventricular systolic function is normal. Left Atrium: Left atrial size was moderately dilated. No  left atrial/left atrial appendage thrombus was detected. The LAA emptying velocity was 32 cm/s. Right Atrium: Right atrial size was normal in size. Pericardium: There is no evidence of pericardial effusion. Mitral Valve: The mitral valve is normal in structure. Mild mitral valve regurgitation. No evidence of mitral valve stenosis. There is no evidence of mitral valve vegetation. Tricuspid Valve: The tricuspid valve is normal in structure. Tricuspid valve regurgitation is trivial. No evidence of tricuspid stenosis. There is no evidence of tricuspid valve vegetation. Aortic Valve: The aortic valve is tricuspid. Aortic valve regurgitation is not visualized. No aortic stenosis is present. There is no evidence of aortic valve vegetation. Pulmonic Valve: The pulmonic valve was normal in structure. Pulmonic valve regurgitation is not visualized. No evidence of pulmonic stenosis. There is no evidence of pulmonic valve vegetation. Aorta: The aortic root and ascending aorta are structurally normal, with no evidence of dilitation. There is moderate (Grade III) layered plaque involving the descending aorta. Venous: The left upper pulmonary vein is normal. IAS/Shunts: Agitated saline contrast was given intravenously to evaluate for intracardiac shunting. Agitated saline contrast bubble study was negative, with no evidence of any interatrial shunt. But if clinical suspicion is high consider limited TTE with  bubble study.  LEFT VENTRICLE PLAX 2D LVOT diam:     2.50 cm   Diastology LV SV:         81        LV e' medial:   28.80 cm/s LV SV Index:   45        LV E/e' medial: 1.8 LVOT Area:     4.91 cm  AORTIC VALVE LVOT Vmax:   99.50 cm/s LVOT Vmean:  64.300 cm/s LVOT VTI:    0.164 m  AORTA Ao Root diam: 3.50 cm Ao  Asc diam:  3.10 cm MITRAL VALVE MV Area (PHT): 1.98 cm    SHUNTS MV Decel Time: 383 msec    Systemic VTI:  0.16 m MV E velocity: 52.33 cm/s  Systemic Diam: 2.50 cm MV A velocity: 54.60 cm/s MV E/A ratio:  0.96 Sunit Tolia Electronically signed by Tessa Lerner Signature Date/Time: 10/30/2023/4:27:33 PM    Final    EP STUDY  Result Date: 10/30/2023 See surgical note for result.  ECHOCARDIOGRAM COMPLETE  Result Date: 10/29/2023    ECHOCARDIOGRAM REPORT   Patient Name:   Caleb Simmons Date of Exam: 10/29/2023 Medical Rec #:  [id number]    Height:       70.0 in Accession #:    [id number]   Weight:       138.4 lb Date of Birth:  1951/03/14     BSA:          1.785 m Patient Age:    72 years     BP:           160/85 mmHg Patient Gender: M            HR:           67 bpm. Exam Location:  Inpatient Procedure: 2D Echo, Cardiac Doppler, Color Doppler and Strain Analysis Indications:    Bacteremia  History:        Patient has prior history of Echocardiogram examinations, most                 recent 07/30/2022. CHF, Stroke; Risk Factors:Hypertension and                 Current Smoker.  Sonographer:    Casimiro Needle  Cox Referring Phys: [id number] DAWOOD Teena Irani  Sonographer Comments: Global longitudinal strain was attempted. IMPRESSIONS  1. Left ventricular ejection fraction, by estimation, is 50 to 55%. The left ventricle has low normal function. The left ventricle has no regional wall motion abnormalities. There is mild left ventricular hypertrophy. Left ventricular diastolic parameters are consistent with Grade III diastolic dysfunction (restrictive). Elevated left atrial pressure.  2. Right ventricular systolic function is normal. The right ventricular size is normal. There is mildly elevated pulmonary artery systolic pressure. The estimated right ventricular systolic pressure is 42.3 mmHg.  3. Left atrial size was moderately dilated.  4. The mitral valve is normal in structure. Trivial mitral valve regurgitation. No evidence of  mitral stenosis.  5. The aortic valve was not well visualized. Aortic valve regurgitation is not visualized. Aortic valve sclerosis/calcification is present, without any evidence of aortic stenosis.  6. The inferior vena cava is dilated in size with >50% respiratory variability, suggesting right atrial pressure of 8 mmHg. Conclusion(s)/Recommendation(s): No clear vegetation seen, but technically difficult study. Consider a transesophageal echocardiogram to exclude infective endocarditis if clinically indicated. FINDINGS  Left Ventricle: Left ventricular ejection fraction, by estimation, is 50 to 55%. The left ventricle has low normal function. The left ventricle has no regional wall motion abnormalities. The left ventricular internal cavity size was normal in size. There is mild left ventricular hypertrophy. Left ventricular diastolic parameters are consistent with Grade III diastolic dysfunction (restrictive). Elevated left atrial pressure. Right Ventricle: The right ventricular size is normal. No increase in right ventricular wall thickness. Right ventricular systolic function is normal. There is mildly elevated pulmonary artery systolic pressure. The tricuspid regurgitant velocity is 2.93  m/s, and with an assumed right atrial pressure of 8 mmHg, the estimated right ventricular systolic pressure is 42.3 mmHg. Left Atrium: Left atrial size was moderately dilated. Right Atrium: Right atrial size was normal in size. Pericardium: There is no evidence of pericardial effusion. Mitral Valve: The mitral valve is normal in structure. Trivial mitral valve regurgitation. No evidence of mitral valve stenosis. Tricuspid Valve: The tricuspid valve is normal in structure. Tricuspid valve regurgitation is trivial. Aortic Valve: The aortic valve was not well visualized. Aortic valve regurgitation is not visualized. Aortic valve sclerosis/calcification is present, without any evidence of aortic stenosis. Aortic valve mean gradient  measures 6.0 mmHg. Aortic valve peak gradient measures 14.6 mmHg. Aortic valve area, by VTI measures 0.90 cm. Pulmonic Valve: The pulmonic valve was not well visualized. Pulmonic valve regurgitation is not visualized. Aorta: The aortic root is normal in size and structure. Venous: The inferior vena cava is dilated in size with greater than 50% respiratory variability, suggesting right atrial pressure of 8 mmHg. IAS/Shunts: The interatrial septum was not well visualized.  LEFT VENTRICLE PLAX 2D LVIDd:         5.60 cm      Diastology LVIDs:         4.40 cm      LV e' medial:    4.79 cm/s LV PW:         1.10 cm      LV E/e' medial:  17.7 LV IVS:        1.60 cm      LV e' lateral:   7.51 cm/s LVOT diam:     2.00 cm      LV E/e' lateral: 11.3 LV SV:         32 LV SV Index:   18 LVOT Area:  3.14 cm  LV Volumes (MOD) LV vol d, MOD A2C: 108.0 ml LV vol d, MOD A4C: 118.0 ml LV vol s, MOD A2C: 59.1 ml LV vol s, MOD A4C: 58.1 ml LV SV MOD A2C:     48.9 ml LV SV MOD A4C:     118.0 ml LV SV MOD BP:      53.5 ml RIGHT VENTRICLE             IVC RV Basal diam:  3.80 cm     IVC diam: 2.10 cm RV S prime:     12.30 cm/s TAPSE (M-mode): 2.1 cm LEFT ATRIUM             Index        RIGHT ATRIUM           Index LA diam:        4.30 cm 2.41 cm/m   RA Area:     17.30 cm LA Vol (A2C):   95.8 ml 53.66 ml/m  RA Volume:   49.10 ml  27.50 ml/m LA Vol (A4C):   65.3 ml 36.58 ml/m LA Biplane Vol: 86.5 ml 48.45 ml/m  AORTIC VALVE AV Area (Vmax):    1.13 cm AV Area (Vmean):   1.34 cm AV Area (VTI):     0.90 cm AV Vmax:           191.00 cm/s AV Vmean:          115.000 cm/s AV VTI:            0.357 m AV Peak Grad:      14.6 mmHg AV Mean Grad:      6.0 mmHg LVOT Vmax:         68.50 cm/s LVOT Vmean:        49.000 cm/s LVOT VTI:          0.102 m LVOT/AV VTI ratio: 0.29  AORTA Ao Root diam: 3.70 cm MITRAL VALVE               TRICUSPID VALVE MV Area (PHT): 4.54 cm    TR Peak grad:   34.3 mmHg MV Decel Time: 167 msec    TR Vmax:        293.00  cm/s MV E velocity: 84.80 cm/s MV A velocity: 24.90 cm/s  SHUNTS MV E/A ratio:  3.41        Systemic VTI:  0.10 m                            Systemic Diam: 2.00 cm Epifanio Lesches MD Electronically signed by Epifanio Lesches MD Signature Date/Time: 10/29/2023/10:24:48 AM    Final    IR Removal Tun Cv Cath W/O FL  Result Date: 10/28/2023 INDICATION: Patient with a history of ESRD on hemodialysis via right IJ tunneled dialysis catheter. Catheter last exchanged 05/02/22. Patient now admitted with bacteremia that is likely catheter related. IR requested to removed catheter for a line holiday. EXAM: REMOVAL TUNNELED CENTRAL VENOUS CATHETER MEDICATIONS: 1% lidocaine 20 mL ANESTHESIA/SEDATION: none FLUOROSCOPY: none COMPLICATIONS: None immediate. PROCEDURE: Informed written consent was obtained from the patient after a thorough discussion of the procedural risks, benefits and alternatives. All questions were addressed. Maximal Sterile Barrier Technique was utilized including caps, mask, sterile gowns, sterile gloves, sterile drape, hand hygiene and skin antiseptic. A timeout was performed prior to the initiation of the procedure. The patient's right chest and catheter was prepped and  draped in a normal sterile fashion. Heparin was removed from both ports of catheter. 1% lidocaine was used for local anesthesia. Using gentle blunt dissection and moderate manual traction the cuff of the catheter was exposed and the catheter was removed in it's entirety. The catheter had developed a fibrin sheath which was pulled out of the skin when the catheter was removed. The fibrin sheath was removed with scissors. Pressure was held till hemostasis was obtained. A sterile dressing was applied. The patient tolerated the procedure well with no immediate complications. IMPRESSION: Successful catheter removal as described above. Procedure performed by Alwyn Ren NP Electronically Signed   By: Gilmer Mor D.O.   On:  10/28/2023 14:45   DG Chest 2 View  Result Date: 10/27/2023 CLINICAL DATA:  Weakness and cough. EXAM: CHEST - 2 VIEW COMPARISON:  Chest radiograph dated 10/24/2023. FINDINGS: Dialysis catheter in similar position. No focal consolidation, pleural effusion, or pneumothorax. The cardiac silhouette is within normal limits. No acute osseous pathology. IMPRESSION: No active cardiopulmonary disease. Electronically Signed   By: Elgie Collard M.D.   On: 10/27/2023 19:37   DG Chest Portable 1 View  Result Date: 10/24/2023 CLINICAL DATA:  Sepsis.  Fever.  Dialysis patient. EXAM: PORTABLE CHEST 1 VIEW COMPARISON:  Radiographs 12/19/2022 and 01/14/2022. FINDINGS: 1452 hours. Right IJ hemodialysis catheter tip extends to the superior cavoatrial junction. The heart size and mediastinal contours are stable. There is vascular congestion without definite edema, focal airspace disease, significant pleural effusion or pneumothorax. The bones appear unchanged, without acute findings. Telemetry leads overlie the chest. IMPRESSION: Vascular congestion without definite edema or focal airspace disease. Electronically Signed   By: Carey Bullocks M.D.   On: 10/24/2023 17:39

## 2023-11-10 ENCOUNTER — Other Ambulatory Visit: Payer: Self-pay

## 2023-11-10 DIAGNOSIS — E854 Organ-limited amyloidosis: Secondary | ICD-10-CM

## 2023-11-10 DIAGNOSIS — G4700 Insomnia, unspecified: Secondary | ICD-10-CM

## 2023-11-10 DIAGNOSIS — Z515 Encounter for palliative care: Secondary | ICD-10-CM

## 2023-11-10 MED ORDER — ZOLPIDEM TARTRATE 5 MG PO TABS: 5.0000 mg | ORAL_TABLET | Freq: Every evening | ORAL | 0 refills | Status: DC | PRN

## 2023-11-10 MED ORDER — OXYCODONE HCL 15 MG PO TABS: 15.0000 mg | ORAL_TABLET | ORAL | 0 refills | Status: DC | PRN

## 2023-11-10 NOTE — Progress Notes (Unsigned)
Palliative Medicine [facility]  [phone] [phone]) [phone]   Name: Caleb Simmons Date: 11/10/2023 MRN: [id number]  DOB: 26-Feb-1951  Patient Care Team: [name], MD as PCP - General (Hematology) Buntin, Daisy Floro, RN (Inactive) as Registered Nurse Pickenpack-Cousar, Arty Baumgartner, NP as Nurse Practitioner (Nurse Practitioner) [name], MD as Attending Physician (Hematology and Oncology)   INTERVAL HISTORY: Caleb Simmons is a 72 y.o. 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  Recently admitted and discharged on 01/20/22 after receiving treatment for left MCA CVA. Palliative following for ongoing goals of care discussions and symptom management.   SOCIAL HISTORY:     reports that he has been smoking cigarettes. He has a 6.3 pack-year smoking history. He has never used smokeless tobacco.  ADVANCE DIRECTIVES:  Patient reports completed document.  [name], Slee. is his healthcare power of attorney.  CODE STATUS: Full code  PAST MEDICAL HISTORY: Past Medical History:  Diagnosis Date   Cancer (HCC)     ALLERGIES:  is allergic to iron sucrose.  MEDICATIONS:  Current Outpatient Medications  Medication Sig Dispense Refill   acetaminophen (TYLENOL) 500 MG tablet Take 1,000 mg by mouth every 6 (six) hours as needed for moderate pain.     ALPRAZolam (XANAX) 1 MG tablet TAKE 1 TABLET BY MOUTH EVERY 8 HOURS AS NEEDED FOR ANXIETY OR SLEEP (Patient taking differently: Take 1 mg by mouth every 8 (eight) hours as needed for sleep.) 90 tablet 0   aspirin 81 MG EC tablet Take 1 tablet (81 mg total) by mouth daily. Swallow whole. 30 tablet 11   ceFAZolin (ANCEF) 1-4 GM/50ML-% SOLN IV cefazolin will be dosed with hemodialysis as an outpatient-end of treatment date 11/24/2023     ferrous sulfate 325 (65 FE) MG EC tablet Take 1 tablet  (325 mg total) by mouth every other day.     fluticasone (FLONASE) 50 MCG/ACT nasal spray Place 1 spray into both nostrils daily.     metoprolol succinate (TOPROL-XL) 25 MG 24 hr tablet TAKE 1/2 TABLET BY MOUTH EVERY DAY 15 tablet 4   mirtazapine (REMERON) 30 MG tablet TAKE 1 TABLET BY MOUTH AT BEDTIME 30 tablet 1   Nutritional Supplements (FEEDING SUPPLEMENT, NEPRO CARB STEADY,) LIQD Take 237 mLs by mouth 2 (two) times daily between meals. (Patient not taking: Reported on 10/27/2023)     oxyCODONE (OXY IR/ROXICODONE) 5 MG immediate release tablet Take 3 tablets (15 mg total) by mouth every 6 (six) hours as needed for severe pain (pain score 7-10). 30 tablet 0   pantoprazole (PROTONIX) 40 MG tablet TAKE 1 TABLET BY MOUTH 2 TIMES DAILY BEFORE MEALS (Patient taking differently: Take 40 mg by mouth 2 (two) times daily before a meal.) 60 tablet 0   SENNA PLUS 8.6-50 MG tablet Take 1 tablet by mouth daily.     tamsulosin (FLOMAX) 0.4 MG CAPS capsule TAKE 1 CAPSULE BY MOUTH ONCE DAILY 30 capsule 0   zolpidem (AMBIEN) 5 MG tablet Take 1 tablet (5 mg total) by mouth at bedtime as needed. for sleep 30 tablet 0   No current facility-administered medications for this visit.   Facility-Administered Medications Ordered in Other Visits  Medication Dose Route Frequency Provider Last Rate Last Admin   0.9 %  sodium chloride infusion (Manually program via Guardrails IV Fluids)  250 mL Intravenous Once [name], MD  heparin lock flush 100 unit/mL  500 Units Intracatheter Once [name], MD       sodium chloride flush (NS) 0.9 % injection 10 mL  10 mL Intracatheter Once [name], MD        VITAL SIGNS: There were no vitals taken for this visit. There were no vitals filed for this visit.    Estimated body mass index is 19.87 kg/m as calculated from the following:   Height as of 10/28/23: 5\' 10"  (1.778 m).   Weight as of 10/28/23: 138 lb 7.2 oz (62.8 kg).   PERFORMANCE STATUS (ECOG) : 2 - Symptomatic,  <50% confined to bed  Assessment NAD, in wheelchair  RRR Normal breathing pattern AAO x3  IMPRESSION: Caleb Simmons presents to clinic today for follow-up. Reports he is feeling better compared to previous weeks. Has been outside doing some work around the home. Decrease in fatigue however ongoing. Listening to his body and taking rest breaks as needed.  Denies nausea, vomiting, constipation, or diarrhea.  Is taking things one day at a time.  Pain Caleb Simmons reports his pain is well controlled. Is taking oxycodone as needed.  Tolerating without difficulty.  Does not take around-the-clock.   We will continue to closely monitor.    Decreased Appetite Appetite fluctuates. Some days are better than others.  Feels appetite is slowly improving.  His weight is up 248 pounds compared to previous visit at 144lb.  140 lbs on 7/25, 139lbs on 6/27lbs.   PLAN: Oxy IR 5-10 mg every 4-6 hours as needed for breakthrough pain Appetite continues to be a challenge but slowly improving. Marinol 2.5mg  Followed by Dietician.  Xanax 0.5 mg every 8 hours as needed for anxiety/sleep Miralax twice daily Senna-S daily Zofran as needed for nausea.  I will plan to see him back in 4-6 weeks in collaboration with his other oncology appointments.   Patient expressed understanding and was in agreement with this plan. He also understands that He can call the clinic at any time with any questions, concerns, or complaints.     Any controlled substances utilized were prescribed in the context of palliative care. PDMP has been reviewed.    Visit consisted of counseling and education dealing with the complex and emotionally intense issues of symptom management and palliative care in the setting of serious and potentially life-threatening illness.  Willette Alma, AGPCNP-BC  Palliative Medicine Team/[facility]  *Please note that this is a verbal dictation therefore any spelling or grammatical errors are  due to the "[employer]agon [employer] One" system interpretation.

## 2023-11-10 NOTE — Telephone Encounter (Signed)
Pt SO called for medication refill, see associated orders.

## 2023-11-11 ENCOUNTER — Inpatient Hospital Stay (HOSPITAL_BASED_OUTPATIENT_CLINIC_OR_DEPARTMENT_OTHER): Payer: Medicare HMO | Admitting: Nurse Practitioner

## 2023-11-11 ENCOUNTER — Encounter: Payer: Self-pay | Admitting: Nurse Practitioner

## 2023-11-11 ENCOUNTER — Inpatient Hospital Stay: Payer: Medicare HMO

## 2023-11-11 ENCOUNTER — Inpatient Hospital Stay: Payer: Medicare HMO | Attending: Hematology | Admitting: Nurse Practitioner

## 2023-11-11 ENCOUNTER — Other Ambulatory Visit: Payer: Self-pay | Admitting: Hematology

## 2023-11-11 VITALS — BP 131/73 | HR 78 | Temp 98.4°F | Resp 18 | Wt 143.7 lb

## 2023-11-11 DIAGNOSIS — N186 End stage renal disease: Secondary | ICD-10-CM | POA: Diagnosis not present

## 2023-11-11 DIAGNOSIS — R530 Neoplastic (malignant) related fatigue: Secondary | ICD-10-CM | POA: Diagnosis not present

## 2023-11-11 DIAGNOSIS — Z515 Encounter for palliative care: Secondary | ICD-10-CM | POA: Diagnosis not present

## 2023-11-11 DIAGNOSIS — G893 Neoplasm related pain (acute) (chronic): Secondary | ICD-10-CM

## 2023-11-11 DIAGNOSIS — D72829 Elevated white blood cell count, unspecified: Secondary | ICD-10-CM | POA: Diagnosis not present

## 2023-11-11 DIAGNOSIS — E8581 Light chain (AL) amyloidosis: Secondary | ICD-10-CM

## 2023-11-11 DIAGNOSIS — F419 Anxiety disorder, unspecified: Secondary | ICD-10-CM

## 2023-11-11 DIAGNOSIS — Z992 Dependence on renal dialysis: Secondary | ICD-10-CM | POA: Diagnosis not present

## 2023-11-11 DIAGNOSIS — D631 Anemia in chronic kidney disease: Secondary | ICD-10-CM | POA: Diagnosis not present

## 2023-11-11 DIAGNOSIS — R630 Anorexia: Secondary | ICD-10-CM

## 2023-11-11 LAB — CBC WITH DIFFERENTIAL/PLATELET
Abs Immature Granulocytes: 0.02 10*3/uL (ref 0.00–0.07)
Basophils Absolute: 0.1 10*3/uL (ref 0.0–0.1)
Basophils Relative: 1 %
Eosinophils Absolute: 0.1 10*3/uL (ref 0.0–0.5)
Eosinophils Relative: 2 %
HCT: 23.9 % — ABNORMAL LOW (ref 39.0–52.0)
Hemoglobin: 7.5 g/dL — ABNORMAL LOW (ref 13.0–17.0)
Immature Granulocytes: 0 %
Lymphocytes Relative: 21 %
Lymphs Abs: 1.4 10*3/uL (ref 0.7–4.0)
MCH: 30.6 pg (ref 26.0–34.0)
MCHC: 31.4 g/dL (ref 30.0–36.0)
MCV: 97.6 fL (ref 80.0–100.0)
Monocytes Absolute: 0.4 10*3/uL (ref 0.1–1.0)
Monocytes Relative: 6 %
Neutro Abs: 4.4 10*3/uL (ref 1.7–7.7)
Neutrophils Relative %: 70 %
Platelets: 179 10*3/uL (ref 150–400)
RBC: 2.45 MIL/uL — ABNORMAL LOW (ref 4.22–5.81)
RDW: 15.1 % (ref 11.5–15.5)
WBC: 6.4 10*3/uL (ref 4.0–10.5)
nRBC: 0 % (ref 0.0–0.2)

## 2023-11-11 LAB — COMPREHENSIVE METABOLIC PANEL
ALT: <5 U/L (ref 0–44)
AST: 8 U/L — ABNORMAL LOW (ref 15–41)
Albumin: 3.4 g/dL — ABNORMAL LOW (ref 3.5–5.0)
Alkaline Phosphatase: 85 U/L (ref 38–126)
Anion gap: 8 (ref 5–15)
BUN: 34 mg/dL — ABNORMAL HIGH (ref 8–23)
CO2: 29 mmol/L (ref 22–32)
Calcium: 8.4 mg/dL — ABNORMAL LOW (ref 8.9–10.3)
Chloride: 100 mmol/L (ref 98–111)
Creatinine, Ser: 5.45 mg/dL — ABNORMAL HIGH (ref 0.61–1.24)
GFR, Estimated: 10 mL/min — ABNORMAL LOW (ref >60)
Glucose, Bld: 118 mg/dL — ABNORMAL HIGH (ref 70–99)
Potassium: 4.1 mmol/L (ref 3.5–5.1)
Sodium: 137 mmol/L (ref 135–145)
Total Bilirubin: 0.3 mg/dL (ref <1.2)
Total Protein: 6 g/dL — ABNORMAL LOW (ref 6.5–8.1)

## 2023-11-12 ENCOUNTER — Encounter: Payer: Self-pay | Admitting: Hematology

## 2023-11-12 ENCOUNTER — Non-Acute Institutional Stay (HOSPITAL_COMMUNITY)
Admission: RE | Admit: 2023-11-12 | Discharge: 2023-11-12 | Disposition: A | Discharge status: Home / Self Care | Payer: Medicare HMO | Source: Ambulatory Visit | Attending: Internal Medicine | Admitting: Internal Medicine

## 2023-11-12 VITALS — BP 154/77 | HR 68 | Temp 98.6°F | Resp 16

## 2023-11-12 DIAGNOSIS — E8589 Other amyloidosis: Secondary | ICD-10-CM | POA: Diagnosis present

## 2023-11-12 DIAGNOSIS — N186 End stage renal disease: Secondary | ICD-10-CM | POA: Insufficient documentation

## 2023-11-12 LAB — KAPPA/LAMBDA LIGHT CHAINS
Kappa free light chain: 55.1 mg/L — ABNORMAL HIGH (ref 3.3–19.4)
Kappa, lambda light chain ratio: 1.78 — ABNORMAL HIGH (ref 0.26–1.65)
Lambda free light chains: 30.9 mg/L — ABNORMAL HIGH (ref 5.7–26.3)

## 2023-11-12 LAB — PREPARE RBC (CROSSMATCH)

## 2023-11-12 MED ORDER — SODIUM CHLORIDE 0.9% IV SOLUTION: Freq: Once | INTRAVENOUS | Status: AC

## 2023-11-12 NOTE — Progress Notes (Signed)
PATIENT CARE CENTER NOTE:  Diagnosis:  ESRD   Provider: Salome Holmes NP  Procedure:  1 unit PRBC  Patient received via PIV 1 unit of packed red blood cells. Type and screen was done before transfusion. Consent for blood products obtained from patient prior to transfusion.  No premedication required per orders.  Tolerated well, vitals stable, discharge instructions given, verbalized understanding. No post transfusion H/H required per orders.  Patient alert, oriented and ambulatory at the time of discharge, accompanied by spouse, taken in wheelchair to lobby by nursing staff.

## 2023-11-13 ENCOUNTER — Other Ambulatory Visit: Payer: Self-pay

## 2023-11-13 LAB — BPAM RBC
Blood Product Expiration Date: 202412192359
ISSUE DATE / TIME: 202411201229
Unit Type and Rh: 600

## 2023-11-13 LAB — TYPE AND SCREEN
ABO/RH(D): A NEG
Antibody Screen: POSITIVE
Unit division: 0

## 2023-11-16 ENCOUNTER — Encounter: Payer: Self-pay | Admitting: Hematology

## 2023-11-16 ENCOUNTER — Encounter: Payer: Self-pay | Admitting: Nurse Practitioner

## 2023-11-16 LAB — MULTIPLE MYELOMA PANEL, SERUM
Albumin SerPl Elph-Mcnc: 3.2 g/dL (ref 2.9–4.4)
Albumin/Glob SerPl: 1.4 (ref 0.7–1.7)
Alpha 1: 0.4 g/dL (ref 0.0–0.4)
Alpha2 Glob SerPl Elph-Mcnc: 0.9 g/dL (ref 0.4–1.0)
B-Globulin SerPl Elph-Mcnc: 0.7 g/dL (ref 0.7–1.3)
Gamma Glob SerPl Elph-Mcnc: 0.3 g/dL — ABNORMAL LOW (ref 0.4–1.8)
Globulin, Total: 2.3 g/dL (ref 2.2–3.9)
IgA: 72 mg/dL (ref 61–437)
IgG (Immunoglobin G), Serum: 358 mg/dL — ABNORMAL LOW (ref 603–1613)
IgM (Immunoglobulin M), Srm: 61 mg/dL (ref 15–143)
Total Protein ELP: 5.5 g/dL — ABNORMAL LOW (ref 6.0–8.5)

## 2023-11-18 ENCOUNTER — Other Ambulatory Visit: Payer: Self-pay

## 2023-11-18 ENCOUNTER — Telehealth: Payer: Self-pay

## 2023-11-18 ENCOUNTER — Ambulatory Visit (INDEPENDENT_AMBULATORY_CARE_PROVIDER_SITE_OTHER): Payer: Medicare HMO | Admitting: Infectious Diseases

## 2023-11-18 ENCOUNTER — Encounter: Payer: Self-pay | Admitting: Infectious Diseases

## 2023-11-18 VITALS — BP 130/73 | HR 68 | Resp 16 | Ht 70.0 in | Wt 143.0 lb

## 2023-11-18 DIAGNOSIS — B9561 Methicillin susceptible Staphylococcus aureus infection as the cause of diseases classified elsewhere: Secondary | ICD-10-CM | POA: Diagnosis not present

## 2023-11-18 DIAGNOSIS — T827XXD Infection and inflammatory reaction due to other cardiac and vascular devices, implants and grafts, subsequent encounter: Secondary | ICD-10-CM

## 2023-11-18 DIAGNOSIS — Z720 Tobacco use: Secondary | ICD-10-CM

## 2023-11-18 DIAGNOSIS — Z79899 Other long term (current) drug therapy: Secondary | ICD-10-CM | POA: Insufficient documentation

## 2023-11-18 DIAGNOSIS — E8581 Light chain (AL) amyloidosis: Secondary | ICD-10-CM

## 2023-11-18 DIAGNOSIS — F17210 Nicotine dependence, cigarettes, uncomplicated: Secondary | ICD-10-CM

## 2023-11-18 DIAGNOSIS — R7881 Bacteremia: Secondary | ICD-10-CM | POA: Diagnosis not present

## 2023-11-18 NOTE — Progress Notes (Signed)
Patient Active Problem List   Diagnosis Date Noted   Bloodstream infection due to central venous catheter 10/29/2023   Bacteremia associated with intravascular line (HCC) 10/27/2023   Chronic heart failure with preserved ejection fraction (HFpEF) (HCC) 10/25/2023   Anxiety 10/25/2023   Cerebrovascular disease 10/25/2023   Hypomagnesemia 10/25/2023   Hyponatremia 10/25/2023   Suspected sepsis, unclear source 10/24/2023   Acute anemia on anemia of ESRD 10/24/2023   Hyperlipidemia 08/13/2022   Palpitations 01/22/2022   ICAO (internal carotid artery occlusion), left 01/18/2022   Tobacco abuse 01/15/2022   Anemia due to chronic kidney disease 01/15/2022   PAD (peripheral artery disease) (HCC) 01/15/2022   Acute ischemic left MCA stroke (HCC) 01/14/2022   ESRD (end stage renal disease) (HCC) 01/14/2022   BPH (benign prostatic hyperplasia) 01/14/2022   Port-A-Cath in place 11/29/2021   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   ALPRAZOLAM (XANAX) 1 MG TABLET    TAKE 1 TABLET BY MOUTH EVERY 8 HOURS AS NEEDED FOR ANXIETY OR SLEEP   ASPIRIN 81 MG EC TABLET    Take 1 tablet (81 mg total) by mouth daily. Swallow whole.   CEFAZOLIN (ANCEF) 1-4 GM/50ML-% SOLN    IV cefazolin will be dosed with hemodialysis as an outpatient-end of treatment date 11/24/2023   FERROUS SULFATE 325 (65 FE) MG EC TABLET    Take 1 tablet (325 mg total) by mouth every other day.   FLUTICASONE (FLONASE) 50 MCG/ACT NASAL SPRAY    Place 1 spray into both nostrils daily.   FOLIC ACID (FOLVITE) 1 MG TABLET    TAKE 2  TABLETS BY MOUTH ONCE DAILY   METOPROLOL SUCCINATE (TOPROL-XL) 25 MG 24 HR TABLET    TAKE 1/2 TABLET BY MOUTH EVERY DAY   MIRTAZAPINE (REMERON) 30 MG TABLET    TAKE 1 TABLET BY MOUTH AT BEDTIME   NUTRITIONAL SUPPLEMENTS (FEEDING SUPPLEMENT, NEPRO CARB STEADY,) LIQD    Take 237 mLs by mouth 2 (two) times daily between meals.   OXYCODONE (ROXICODONE) 15 MG IMMEDIATE RELEASE TABLET    Take 1 tablet (15 mg total) by mouth every 4 (four) hours as needed for severe pain (pain score 7-10).   PANTOPRAZOLE (PROTONIX) 40 MG TABLET    TAKE 1 TABLET BY MOUTH 2 TIMES DAILY BEFORE MEALS   SENNA PLUS 8.6-50 MG TABLET    Take 1 tablet by mouth daily.   TAMSULOSIN (FLOMAX) 0.4 MG CAPS CAPSULE    TAKE 1 CAPSULE BY MOUTH ONCE DAILY   ZOLPIDEM (AMBIEN) 5 MG TABLET    Take 1 tablet (5 mg total) by mouth at bedtime as needed. for sleep  Modified Medications   No medications on file  Discontinued Medications   No medications on file    Subjective: 72 Y O male with  AL amyloidosis with chemo on hold, ESRD on HD, PAD, strep agalactiae bacteremia in 08/2021 who is here for HFU ( 11/4-11/8) for MSSA bacteremia related to HDC/CLABSI. Discharged on 11/8 to complete 4 weeks of cefazolin with  HD. EOT 11/20/23.   11/26 Accompanied by friend. Reports getting abtx with HD with no concerns. Denies fevers, chills. Denies nausea, vomiting, diarrhea or rashes. Rt HDC ok with no concerns. Feels he is getting stronger each day.  He has no complaints today.   Review of Systems: all systems reviewed and negative   Past Medical History:  Diagnosis Date   Cancer [facility])    Past Surgical History:  Procedure Laterality Date   APPENDECTOMY     IR ANGIO INTRA EXTRACRAN SEL COM CAROTID INNOMINATE BILAT MOD SED  01/16/2022   IR ANGIO INTRA EXTRACRAN SEL COM CAROTID INNOMINATE BILAT MOD SED  07/16/2022   IR ANGIO VERTEBRAL SEL SUBCLAVIAN INNOMINATE BILAT MOD SED  01/16/2022   IR ANGIO VERTEBRAL SEL SUBCLAVIAN INNOMINATE UNI R MOD SED   07/16/2022   IR ANGIO VERTEBRAL SEL VERTEBRAL UNI L MOD SED  07/16/2022   IR CT HEAD LTD  01/18/2022   IR EMBO ART  VEN HEMORR LYMPH EXTRAV  INC GUIDE ROADMAPPING  08/30/2021   IR FLUORO GUIDE CV LINE RIGHT  08/30/2021   IR FLUORO GUIDE CV LINE RIGHT  09/18/2021   IR FLUORO GUIDE CV LINE RIGHT  05/02/2022   IR FLUORO GUIDE CV LINE RIGHT  10/31/2023   IR PERCUTANEOUS ART THROMBECTOMY/INFUSION INTRACRANIAL INC DIAG ANGIO  01/18/2022   IR RADIOLOGIST EVAL & MGMT  08/02/2022   IR REMOVAL TUN CV CATH W/O FL  10/28/2023   IR RENAL SELECTIVE  UNI INC S&I MOD SED  08/31/2021   IR US GUIDE VASC ACCESS RIGHT  08/30/2021   IR US GUIDE VASC ACCESS RIGHT  08/30/2021   IR US GUIDE VASC ACCESS RIGHT  09/18/2021   IR US GUIDE VASC ACCESS RIGHT  01/16/2022   IR US GUIDE VASC ACCESS RIGHT  07/16/2022   IR US GUIDE VASC ACCESS RIGHT  10/31/2023   IR VENOCAVAGRAM SVC  05/02/2022   RADIOLOGY WITH ANESTHESIA N/A 01/18/2022   Procedure: Cerebral angioplasty with possible stenting;  Surgeon: Julieanne Cotton, MD;  Location: [facility] OR;  Service: Radiology;  Laterality: N/A;   TRANSESOPHAGEAL ECHOCARDIOGRAM (CATH LAB) N/A 10/30/2023   Procedure: TRANSESOPHAGEAL ECHOCARDIOGRAM;  Surgeon: Tessa Lerner, DO;  Location: [facility] INVASIVE CV LAB;  Service: Cardiovascular;  Laterality: N/A;    Social History   Tobacco Use   Smoking status: Some Days    Current packs/day: 0.25    Average packs/day: 0.3 packs/day for 25.0 years (6.3 ttl pk-yrs)    Types: Cigarettes   Smokeless tobacco: Never    No family history on file.  Allergies  Allergen Reactions   Iron Sucrose Nausea And Vomiting    Health Maintenance  Topic Date Due   COVID-19 Vaccine (1) Never done   Pneumonia Vaccine 24+ Years old (1 of 2 - PCV) Never done   DTaP/Tdap/Td (1 - Tdap) Never done   Zoster Vaccines- Shingrix (1 of 2) Never done   Colonoscopy  Never done   INFLUENZA VACCINE  Completed   Hepatitis C Screening  Completed   HPV VACCINES  Aged Out    Objective: BP  130/73   Pulse 68   Resp 16   Ht 5\' 10"  (1.778 m)   Wt 143 lb (64.9 kg)   SpO2 98%   BMI 20.52 kg/m    Physical Exam Constitutional:      Appearance: Normal appearance.  HENT:     Head: Normocephalic and atraumatic.      Mouth: Mucous membranes are moist.  Eyes:    Conjunctiva/sclera: Conjunctivae normal.     Pupils: Pupils are equal, round, and b/l symmetrical   Cardiovascular:     Rate and Rhythm: Normal rate and regular rhythm.     Heart sounds: s1s2  Pulmonary:     Effort: Pulmonary effort is normal.     Breath sounds: Normal breath sounds.   Abdominal:     General: Non distended     Palpations: soft.   Musculoskeletal:        General: Normal range of motion.   Skin:    General: Skin is warm and dry.     Comments: rt chest HD catheter with no concerns   Neurological:     General: sitting in wheel chair    Mental Status: awake, alert and oriented to person, place, and time.   Psychiatric:        Mood and Affect: Mood normal.   Lab Results Lab Results  Component Value Date   WBC 6.4 11/11/2023   HGB 7.5 (L) 11/11/2023   HCT 23.9 (L) 11/11/2023   MCV 97.6 11/11/2023   PLT 179 11/11/2023    Lab Results  Component Value Date   CREATININE 5.45 (H) 11/11/2023   BUN 34 (H) 11/11/2023   NA 137 11/11/2023   K 4.1 11/11/2023   CL 100 11/11/2023   CO2 29 11/11/2023    Lab Results  Component Value Date   ALT <5 11/11/2023   AST 8 (L) 11/11/2023   ALKPHOS 85 11/11/2023   BILITOT 0.3 11/11/2023    Lab Results  Component Value Date   CHOL 239 (H) 01/15/2022   HDL 28 (L) 01/15/2022   LDLCALC UNABLE TO CALCULATE IF TRIGLYCERIDE OVER 400 mg/dL 16/09/9603   LDLDIRECT 85.5 01/15/2022   TRIG 535 (H) 01/15/2022   CHOLHDL 8.5 01/15/2022   No results found for: "LABRPR", "RPRTITER" No results found for: "HIV1RNAQUANT", "HIV1RNAVL", "CD4TABS"   Assessment/Plan 37 Y O male with  AL amyloidosis with chemo on hold, ESRD on HD, PAD, strep agalactiae  bacteremia in 08/2021 admitted with   # MSSA bacteremia/CLABDSI - Repeat blood cx 11/1 and 11/4 NGTD  - 11/6 TTE negative for vegetations  - 11/7 TEE negative for vegetations  - HDC removed 11/5 and new HDC placed 11/8  Plan - Complete 4 weeks course of cefazolin, EOT 11/28 - Fu as needed   # Medication Monitoring  - Labs from SNF requested - 11/19 CBC and CMP reviewed and unremarkable, hb down to 7.5 ( s/p 1U PRBC on 11/20)  # AL amyloidosis - Follows Oncology, Last seen by Oncology 11/19  # Tobacco abuse - will benefit from counseling   I have personally spent 30  minutes involved in face-to-face and non-face-to-face activities for this patient on the day of the visit. Professional time spent includes the following activities: Preparing to see the patient (review of tests), Obtaining and/or reviewing separately obtained history (admission/discharge record), Performing a medically appropriate examination and/or evaluation , Ordering medications/tests/procedures, referring and communicating with other health care professionals, Documenting clinical information in the EMR, Independently interpreting results (not separately reported), Communicating results to the patient/family/caregiver, Counseling and educating the patient/family/caregiver and Care coordination (not separately reported).   Of note, portions of this note may have been created with voice recognition software. While this note has been edited for accuracy, occasional wrong-word or 'sound-a-like' substitutions may have occurred due to the inherent limitations of voice recognition software.   Victoriano Lain, MD [facility] for  Infectious Disease [facility] 11/18/2023, 3:43 PM

## 2023-11-18 NOTE — Telephone Encounter (Signed)
Called [facility] spoke to [place] on [address] she is faxing all lab results and is aware EOT date is 11/20/23.

## 2023-12-05 NOTE — Progress Notes (Unsigned)
Palliative Medicine [facility]  [phone] [phone]) [phone]   Name: Caleb Simmons Date: 12/05/2023 MRN: [id number]  DOB: [birth year]  Patient Care Team: [name], MD as PCP - General (Hematology) Buntin, Daisy Floro, RN (Inactive) as Registered Nurse Pickenpack-Cousar, Arty Baumgartner, NP as Nurse Practitioner (Nurse Practitioner) [name], MD as Attending Physician (Hematology and Oncology)   INTERVAL HISTORY: JAPHET Simmons is a 72 y.o. 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  Recently admitted and discharged on 01/20/22 after receiving treatment for left MCA CVA. Palliative following for ongoing goals of care discussions and symptom management.   SOCIAL HISTORY:     reports that he has been smoking cigarettes. He has a 6.3 pack-year smoking history. He has never used smokeless tobacco.  ADVANCE DIRECTIVES:  Patient reports completed document.  [name]. is his healthcare power of attorney.  CODE STATUS: Full code  PAST MEDICAL HISTORY: Past Medical History:  Diagnosis Date   Cancer (HCC)     ALLERGIES:  is allergic to iron sucrose.  MEDICATIONS:  Current Outpatient Medications  Medication Sig Dispense Refill   acetaminophen (TYLENOL) 500 MG tablet Take 1,000 mg by mouth every 6 (six) hours as needed for moderate pain.     ALPRAZolam (XANAX) 1 MG tablet TAKE 1 TABLET BY MOUTH EVERY 8 HOURS AS NEEDED FOR ANXIETY OR SLEEP (Patient taking differently: Take 1 mg by mouth every 8 (eight) hours as needed for sleep.) 90 tablet 0   aspirin 81 MG EC tablet Take 1 tablet (81 mg total) by mouth daily. Swallow whole. 30 tablet 11   ferrous sulfate 325 (65 FE) MG EC tablet Take 1 tablet (325 mg total) by mouth every other day.     fluticasone (FLONASE) 50 MCG/ACT nasal spray Place 1 spray into both nostrils daily.      folic acid (FOLVITE) 1 MG tablet TAKE 2 TABLETS BY MOUTH ONCE DAILY 60 tablet 0   metoprolol succinate (TOPROL-XL) 25 MG 24 hr tablet TAKE 1/2 TABLET BY MOUTH EVERY DAY 15 tablet 4   mirtazapine (REMERON) 30 MG tablet TAKE 1 TABLET BY MOUTH AT BEDTIME 30 tablet 1   Nutritional Supplements (FEEDING SUPPLEMENT, NEPRO CARB STEADY,) LIQD Take 237 mLs by mouth 2 (two) times daily between meals. (Patient not taking: Reported on 10/27/2023)     oxyCODONE (ROXICODONE) 15 MG immediate release tablet Take 1 tablet (15 mg total) by mouth every 4 (four) hours as needed for severe pain (pain score 7-10). 90 tablet 0   pantoprazole (PROTONIX) 40 MG tablet TAKE 1 TABLET BY MOUTH 2 TIMES DAILY BEFORE MEALS 60 tablet 0   SENNA PLUS 8.6-50 MG tablet Take 1 tablet by mouth daily. (Patient not taking: Reported on 11/11/2023)     tamsulosin (FLOMAX) 0.4 MG CAPS capsule TAKE 1 CAPSULE BY MOUTH ONCE DAILY 30 capsule 0   zolpidem (AMBIEN) 5 MG tablet Take 1 tablet (5 mg total) by mouth at bedtime as needed. for sleep 30 tablet 0   No current facility-administered medications for this visit.   Facility-Administered Medications Ordered in Other Visits  Medication Dose Route Frequency Provider Last Rate Last Admin   0.9 %  sodium chloride infusion (Manually program via Guardrails IV Fluids)  250 mL Intravenous Once [name], MD       heparin lock flush 100 unit/mL  500 Units Intracatheter  Once [name], MD       sodium chloride flush (NS) 0.9 % injection 10 mL  10 mL Intracatheter Once [name], MD        VITAL SIGNS: There were no vitals taken for this visit. There were no vitals filed for this visit.    Estimated body mass index is 20.52 kg/m as calculated from the following:   Height as of 11/18/23: 5\' 10"  (1.778 m).   Weight as of 11/18/23: 143 lb (64.9 kg).   PERFORMANCE STATUS (ECOG) : 2 - Symptomatic, <50% confined to bed  Assessment NAD, in wheelchair  RRR Normal breathing pattern AAO  x3  IMPRESSION:  Mr. Relyea presents to clinic for follow-up. No acute distress. Significant Other [name] is present. Recent hospitalization due to MSSA bacteremia associated with his HD catheter. He reports feeling "so-so" since discharge. He describes a series of invasive procedures, including the insertion of a 'garden hose' down his throat, presumably a transesophageal echocardiogram, to investigate the infection behind his heart. He is currently on an antibiotic regimen (Ancef x4 weeks per ID), which is administered during each dialysis session.  The patient also reports fluctuating appetite, which has been particularly poor since his hospital stay, attributing this to the poor quality of hospital food and fatigue. Feels this is slowly improving.   The patient is also on iron supplementation, which is administered during each dialysis session. He reports no issues with constipation or diarrhea. He was previously on a stool softener, which has since been discontinued per discharge instructions. Per patient he is also scheduled for a blood transfusion tomorrow outpatient.    Pain is well managed. He is currently taking Oxycodone IR 5mg  as needed every 6 hours for pain. Does not require around the clock. Xanax as needed for anxiety. Sleeping well at night.    We will continue to closely monitor and support.   PLAN:  End Stage Renal Disease on Hemodialysis Receiving iron supplementation during dialysis sessions. Recent hospitalization for MSSA bacteremia of HD catheter, currently receiving antibiotics during dialysis. -Continue current dialysis regimen and antibiotic administration during dialysis. -Continue iron supplementation during dialysis.  Anemia Patient scheduled for blood transfusion tomorrow. Patient inquired about possibility of receiving transfusion today due to scheduling conflict. -Check feasibility of moving blood transfusion to today, if not possible, maintain current  schedule.  Medication Management -Continue Oxy IR 5-10mg  as needed every 6 hours for pain -Xanax 0.5-1mg  every 8 hrs as needed for anxiety/sleep -Zofran as needed for nausea  Follow-up Patient to see Herbert Seta, Dr. Latanya Maudlin nurse practitioner, today. -Will see back in clinic on December 11, 2023.  Patient expressed understanding and was in agreement with this plan. He also understands that He can call the clinic at any time with any questions, concerns, or complaints.     Any controlled substances utilized were prescribed in the context of palliative care. PDMP has been reviewed.    Visit consisted of counseling and education dealing with the complex and emotionally intense issues of symptom management and palliative care in the setting of serious and potentially life-threatening illness.  Willette Alma, AGPCNP-BC  Palliative Medicine Team/[facility]  *Please note that this is a verbal dictation therefore any spelling or grammatical errors are due to the "Dragon Medical One" system interpretation.

## 2023-12-08 ENCOUNTER — Other Ambulatory Visit: Payer: Self-pay | Admitting: Nurse Practitioner

## 2023-12-08 DIAGNOSIS — E8581 Light chain (AL) amyloidosis: Secondary | ICD-10-CM

## 2023-12-08 DIAGNOSIS — G4700 Insomnia, unspecified: Secondary | ICD-10-CM

## 2023-12-08 DIAGNOSIS — F419 Anxiety disorder, unspecified: Secondary | ICD-10-CM

## 2023-12-08 DIAGNOSIS — Z515 Encounter for palliative care: Secondary | ICD-10-CM

## 2023-12-08 DIAGNOSIS — G893 Neoplasm related pain (acute) (chronic): Secondary | ICD-10-CM

## 2023-12-08 DIAGNOSIS — E854 Organ-limited amyloidosis: Secondary | ICD-10-CM

## 2023-12-08 MED ORDER — OXYCODONE HCL 15 MG PO TABS: 15.0000 mg | ORAL_TABLET | ORAL | 0 refills | Status: DC | PRN

## 2023-12-08 MED ORDER — ALPRAZOLAM 1 MG PO TABS: 1.0000 mg | ORAL_TABLET | Freq: Three times a day (TID) | ORAL | 0 refills | Status: DC | PRN

## 2023-12-09 ENCOUNTER — Other Ambulatory Visit: Payer: Self-pay | Admitting: Nurse Practitioner

## 2023-12-09 ENCOUNTER — Other Ambulatory Visit: Payer: Self-pay | Admitting: Hematology

## 2023-12-10 ENCOUNTER — Other Ambulatory Visit: Payer: Self-pay

## 2023-12-10 DIAGNOSIS — E8581 Light chain (AL) amyloidosis: Secondary | ICD-10-CM

## 2023-12-11 ENCOUNTER — Encounter: Payer: Self-pay | Admitting: Nurse Practitioner

## 2023-12-11 ENCOUNTER — Inpatient Hospital Stay: Payer: Medicare HMO | Attending: Hematology

## 2023-12-11 ENCOUNTER — Inpatient Hospital Stay (HOSPITAL_BASED_OUTPATIENT_CLINIC_OR_DEPARTMENT_OTHER): Payer: Medicare HMO | Admitting: Nurse Practitioner

## 2023-12-11 VITALS — BP 152/93 | HR 98 | Temp 97.3°F | Resp 16 | Wt 148.7 lb

## 2023-12-11 DIAGNOSIS — Z992 Dependence on renal dialysis: Secondary | ICD-10-CM | POA: Diagnosis not present

## 2023-12-11 DIAGNOSIS — D638 Anemia in other chronic diseases classified elsewhere: Secondary | ICD-10-CM

## 2023-12-11 DIAGNOSIS — E8581 Light chain (AL) amyloidosis: Secondary | ICD-10-CM | POA: Diagnosis present

## 2023-12-11 DIAGNOSIS — N186 End stage renal disease: Secondary | ICD-10-CM | POA: Diagnosis not present

## 2023-12-11 DIAGNOSIS — F419 Anxiety disorder, unspecified: Secondary | ICD-10-CM | POA: Diagnosis not present

## 2023-12-11 DIAGNOSIS — Z515 Encounter for palliative care: Secondary | ICD-10-CM | POA: Diagnosis not present

## 2023-12-11 DIAGNOSIS — G893 Neoplasm related pain (acute) (chronic): Secondary | ICD-10-CM

## 2023-12-11 DIAGNOSIS — I509 Heart failure, unspecified: Secondary | ICD-10-CM | POA: Diagnosis not present

## 2023-12-11 LAB — CMP (CANCER CENTER ONLY)
ALT: <5 U/L (ref 0–44)
AST: 7 U/L — ABNORMAL LOW (ref 15–41)
Albumin: 3.8 g/dL (ref 3.5–5.0)
Alkaline Phosphatase: 90 U/L (ref 38–126)
Anion gap: 10 (ref 5–15)
BUN: 54 mg/dL — ABNORMAL HIGH (ref 8–23)
CO2: 23 mmol/L (ref 22–32)
Calcium: 8.4 mg/dL — ABNORMAL LOW (ref 8.9–10.3)
Chloride: 104 mmol/L (ref 98–111)
Creatinine: 5.48 mg/dL — ABNORMAL HIGH (ref 0.61–1.24)
GFR, Estimated: 10 mL/min — ABNORMAL LOW (ref >60)
Glucose, Bld: 88 mg/dL (ref 70–99)
Potassium: 4.7 mmol/L (ref 3.5–5.1)
Sodium: 137 mmol/L (ref 135–145)
Total Bilirubin: 0.5 mg/dL (ref <1.2)
Total Protein: 6 g/dL — ABNORMAL LOW (ref 6.5–8.1)

## 2023-12-11 LAB — CBC WITH DIFFERENTIAL (CANCER CENTER ONLY)
Abs Immature Granulocytes: 0.03 10*3/uL (ref 0.00–0.07)
Basophils Absolute: 0.1 10*3/uL (ref 0.0–0.1)
Basophils Relative: 1 %
Eosinophils Absolute: 0.3 10*3/uL (ref 0.0–0.5)
Eosinophils Relative: 4 %
HCT: 29.1 % — ABNORMAL LOW (ref 39.0–52.0)
Hemoglobin: 9.5 g/dL — ABNORMAL LOW (ref 13.0–17.0)
Immature Granulocytes: 0 %
Lymphocytes Relative: 24 %
Lymphs Abs: 1.9 10*3/uL (ref 0.7–4.0)
MCH: 30.3 pg (ref 26.0–34.0)
MCHC: 32.6 g/dL (ref 30.0–36.0)
MCV: 92.7 fL (ref 80.0–100.0)
Monocytes Absolute: 0.5 10*3/uL (ref 0.1–1.0)
Monocytes Relative: 6 %
Neutro Abs: 5 10*3/uL (ref 1.7–7.7)
Neutrophils Relative %: 65 %
Platelet Count: 163 10*3/uL (ref 150–400)
RBC: 3.14 MIL/uL — ABNORMAL LOW (ref 4.22–5.81)
RDW: 15.2 % (ref 11.5–15.5)
WBC Count: 7.8 10*3/uL (ref 4.0–10.5)
nRBC: 0 % (ref 0.0–0.2)

## 2023-12-11 LAB — FERRITIN: Ferritin: 402 ng/mL — ABNORMAL HIGH (ref 24–336)

## 2023-12-12 LAB — KAPPA/LAMBDA LIGHT CHAINS
Kappa free light chain: 53.4 mg/L — ABNORMAL HIGH (ref 3.3–19.4)
Kappa, lambda light chain ratio: 1.87 — ABNORMAL HIGH (ref 0.26–1.65)
Lambda free light chains: 28.6 mg/L — ABNORMAL HIGH (ref 5.7–26.3)

## 2023-12-17 NOTE — Progress Notes (Deleted)
Patient Care Team: [name], MD as PCP - General (Hematology) Buntin, Daisy Floro, RN (Inactive) as Registered Nurse Pickenpack-Cousar, Arty Baumgartner, NP as Nurse Practitioner (Nurse Practitioner) [name], MD as Attending Physician (Hematology and Oncology)  Clinic Day:  12/17/2023  Referring physician: [name], MD  ASSESSMENT & PLAN:   Assessment & Plan: AL amyloidosis (HCC)  -lamda light chain disease with renal, cardiac and neuro involvement   -diagnosed in 08/2021 -not a candidate for transplant  --He began weekly oral Cytoxan, dexa and Velcade injection while inpatient on 09/10/21.  He tolerated well. -He began daratumumab injection 09/27/21. Chemo was held briefly following stroke 01/14/22. -not a candidate for biphosphonate due to renal failure -he completed 6 cycle induction chemo with CyBorD on 03/21/22 and moved to monthly Dara maintenance on 03/28/22. He completed a total of 2 year therapy in 08/2023 -He has had very good partial response to treatment based on his M protein and light chain level. -continued to feel progressively weak after completing daratumumab. He was recently hospitalized from 10/24/2023 through 10/26/2023 due to  fever and suspected sepsis. He had severe anemia. Hemoglobin was as low as 7.3. He had leukocytosis upon admission. Chest x-ray ws concerning for infiltrates. Was discharged after empiric antibiotics administered and he was feeling stronger. Was hospitalized  a second time from 10/27/2023 through 10/31/2023. Blood cultures, done during first hospitalization came back positive for MRSA.          The patient understands the plans discussed today and is in agreement with them.  He knows to contact our office if he develops concerns prior to his next appointment.  I provided *** minutes of face-to-face time during this encounter and > 50% was spent counseling as documented under my assessment and plan.    Carlean Jews, NP  [facility]  [facility] - [facility] DE[facility] OF [facility] [address] [place] [address] Dept: [phone] Dept Fax: [phone]   No orders of the defined types were placed in this encounter.     CHIEF COMPLAINT:  CC: AL amyloidosis  Current Treatment: Surveillance  INTERVAL HISTORY:  Caleb Simmons is here today for repeat clinical assessment.  Currently on hemodialysis.  He last seen for oncology follow-up 11/12/2023 requiring self.  He is seeing Lowella Bandy, NP, on 12/11/2023 due to symptom management.  Was reported that patient had improved appetite with a 5 pound weight gain.  He reports pain being well managed with oxycodone and well-managed anxiety.  He denies fevers or chills. He denies pain. His appetite is good. His weight {Weight change:10426}.  I have reviewed the past medical history, past surgical history, social history and family history with the patient and they are unchanged from previous note.  ALLERGIES:  is allergic to iron sucrose.  MEDICATIONS:  Current Outpatient Medications  Medication Sig Dispense Refill   acetaminophen (TYLENOL) 500 MG tablet Take 1,000 mg by mouth every 6 (six) hours as needed for moderate pain.     ALPRAZolam (XANAX) 1 MG tablet Take 1 tablet (1 mg total) by mouth 3 (three) times daily as needed for anxiety or sleep. 90 tablet 0   aspirin 81 MG EC tablet Take 1 tablet (81 mg total) by mouth daily. Swallow whole. 30 tablet 11   ferrous sulfate 325 (65 FE) MG EC tablet Take 1 tablet (325 mg total) by mouth every other day.     fluticasone (FLONASE) 50 MCG/ACT nasal spray  Place 1 spray into both nostrils daily.     folic acid (FOLVITE) 1 MG tablet TAKE 2 TABLETS BY MOUTH ONCE DAILY 60 tablet 0   metoprolol succinate (TOPROL-XL) 25 MG 24 hr tablet TAKE 1/2 TABLET BY MOUTH EVERY DAY 15 tablet 4   mirtazapine (REMERON) 30 MG tablet TAKE 1 TABLET BY MOUTH AT BEDTIME 30 tablet 1   Nutritional Supplements (FEEDING SUPPLEMENT, NEPRO CARB  STEADY,) LIQD Take 237 mLs by mouth 2 (two) times daily between meals. (Patient not taking: Reported on 10/27/2023)     oxyCODONE (ROXICODONE) 15 MG immediate release tablet Take 1 tablet (15 mg total) by mouth every 4 (four) hours as needed. 90 tablet 0   pantoprazole (PROTONIX) 40 MG tablet TAKE 1 TABLET BY MOUTH 2 TIMES DAILY BEFORE MEALS 60 tablet 0   SENNA PLUS 8.6-50 MG tablet Take 1 tablet by mouth daily. (Patient not taking: Reported on 11/11/2023)     tamsulosin (FLOMAX) 0.4 MG CAPS capsule TAKE 1 CAPSULE BY MOUTH ONCE DAILY 30 capsule 0   zolpidem (AMBIEN) 5 MG tablet TAKE 1 TABLET BY MOUTH NIGHTLY AT BEDTIME AS NEEDED FOR SLEEP 30 tablet 1   No current facility-administered medications for this visit.   Facility-Administered Medications Ordered in Other Visits  Medication Dose Route Frequency Provider Last Rate Last Admin   0.9 %  sodium chloride infusion (Manually program via Guardrails IV Fluids)  250 mL Intravenous Once [name], MD       heparin lock flush 100 unit/mL  500 Units Intracatheter Once [name], MD       sodium chloride flush (NS) 0.9 % injection 10 mL  10 mL Intracatheter Once [name], MD        HISTORY OF PRESENT ILLNESS:   Oncology History   No history exists.      REVIEW OF SYSTEMS:   Constitutional: Denies fevers, chills or abnormal weight loss Eyes: Denies blurriness of vision Ears, nose, mouth, throat, and face: Denies mucositis or sore throat Respiratory: Denies cough, dyspnea or wheezes Cardiovascular: Denies palpitation, chest discomfort or lower extremity swelling Gastrointestinal:  Denies nausea, heartburn or change in bowel habits Skin: Denies abnormal skin rashes Lymphatics: Denies new lymphadenopathy or easy bruising Neurological:Denies numbness, tingling or new weaknesses Behavioral/Psych: Mood is stable, no new changes  All other systems were reviewed with the patient and are negative.   VITALS:  There were no vitals taken for this  visit.  Wt Readings from Last 3 Encounters:  12/11/23 148 lb 11.2 oz (67.4 kg)  11/18/23 143 lb (64.9 kg)  11/11/23 143 lb 11.2 oz (65.2 kg)    There is no height or weight on file to calculate BMI.  Performance status (ECOG): {CHL ONC [id number]  PHYSICAL EXAM:   GENERAL:alert, no distress and comfortable SKIN: skin color, texture, turgor are normal, no rashes or significant lesions EYES: normal, Conjunctiva are pink and non-injected, sclera clear OROPHARYNX:no exudate, no erythema and lips, buccal mucosa, and tongue normal  NECK: supple, thyroid normal size, non-tender, without nodularity LYMPH:  no palpable lymphadenopathy in the cervical, axillary or inguinal LUNGS: clear to auscultation and percussion with normal breathing effort HEART: regular rate & rhythm and no murmurs and no lower extremity edema ABDOMEN:abdomen soft, non-tender and normal bowel sounds Musculoskeletal:no cyanosis of digits and no clubbing  NEURO: alert & oriented x 3 with fluent speech, no focal motor/sensory deficits  LABORATORY DATA:  I have reviewed the data as listed    Component Value  Date/Time   NA 137 12/11/2023 0814   K 4.7 12/11/2023 0814   CL 104 12/11/2023 0814   CO2 23 12/11/2023 0814   GLUCOSE 88 12/11/2023 0814   BUN 54 (H) 12/11/2023 0814   CREATININE 5.48 (H) 12/11/2023 0814   CALCIUM 8.4 (L) 12/11/2023 0814   PROT 6.0 (L) 12/11/2023 0814   ALBUMIN 3.8 12/11/2023 0814   AST 7 (L) 12/11/2023 0814   ALT <5 12/11/2023 0814   ALKPHOS 90 12/11/2023 0814   BILITOT 0.5 12/11/2023 0814   GFRNONAA 10 (L) 12/11/2023 0814    No results found for: "SPEP", "UPEP"  Lab Results  Component Value Date   WBC 7.8 12/11/2023   NEUTROABS 5.0 12/11/2023   HGB 9.5 (L) 12/11/2023   HCT 29.1 (L) 12/11/2023   MCV 92.7 12/11/2023   PLT 163 12/11/2023      Chemistry      Component Value Date/Time   NA 137 12/11/2023 0814   K 4.7 12/11/2023 0814   CL 104 12/11/2023 0814   CO2 23  12/11/2023 0814   BUN 54 (H) 12/11/2023 0814   CREATININE 5.48 (H) 12/11/2023 0814      Component Value Date/Time   CALCIUM 8.4 (L) 12/11/2023 0814   ALKPHOS 90 12/11/2023 0814   AST 7 (L) 12/11/2023 0814   ALT <5 12/11/2023 0814   BILITOT 0.5 12/11/2023 0814       RADIOGRAPHIC STUDIES: I have personally reviewed the radiological images as listed and agreed with the findings in the report. No results found.

## 2023-12-17 NOTE — Assessment & Plan Note (Deleted)
-  lamda light chain disease with renal, cardiac and neuro involvement   -diagnosed in 08/2021 -not a candidate for transplant  --He began weekly oral Cytoxan, dexa and Velcade injection while inpatient on 09/10/21.  He tolerated well. -He began daratumumab injection 09/27/21. Chemo was held briefly following stroke 01/14/22. -not a candidate for biphosphonate due to renal failure -he completed 6 cycle induction chemo with CyBorD on 03/21/22 and moved to monthly Dara maintenance on 03/28/22. He completed a total of 2 year therapy in 08/2023 -He has had very good partial response to treatment based on his M protein and light chain level. -continued to feel progressively weak after completing daratumumab. He was recently hospitalized from 10/24/2023 through 10/26/2023 due to  fever and suspected sepsis. He had severe anemia. Hemoglobin was as low as 7.3. He had leukocytosis upon admission. Chest x-ray ws concerning for infiltrates. Was discharged after empiric antibiotics administered and he was feeling stronger. Was hospitalized  a second time from 10/27/2023 through 10/31/2023. Blood cultures, done during first hospitalization came back positive for MRSA.

## 2023-12-18 ENCOUNTER — Other Ambulatory Visit: Payer: Medicare HMO

## 2023-12-18 ENCOUNTER — Inpatient Hospital Stay: Payer: Medicare HMO | Admitting: Nurse Practitioner

## 2023-12-18 DIAGNOSIS — E8581 Light chain (AL) amyloidosis: Secondary | ICD-10-CM

## 2023-12-23 LAB — MULTIPLE MYELOMA PANEL, SERUM
Albumin SerPl Elph-Mcnc: 3 g/dL (ref 2.9–4.4)
Albumin/Glob SerPl: 1.2 (ref 0.7–1.7)
Alpha 1: 0.4 g/dL (ref 0.0–0.4)
Alpha2 Glob SerPl Elph-Mcnc: 1 g/dL (ref 0.4–1.0)
B-Globulin SerPl Elph-Mcnc: 0.9 g/dL (ref 0.7–1.3)
Gamma Glob SerPl Elph-Mcnc: 0.4 g/dL (ref 0.4–1.8)
Globulin, Total: 2.7 g/dL (ref 2.2–3.9)
IgA: 65 mg/dL (ref 61–437)
IgG (Immunoglobin G), Serum: 416 mg/dL — ABNORMAL LOW (ref 603–1613)
IgM (Immunoglobulin M), Srm: 64 mg/dL (ref 15–143)
Total Protein ELP: 5.7 g/dL — ABNORMAL LOW (ref 6.0–8.5)

## 2023-12-24 NOTE — Assessment & Plan Note (Signed)
-  lamda light chain disease with renal, cardiac and neuro involvement   -diagnosed in 08/2021 -not a candidate for transplant  --He began weekly oral Cytoxan, dexa and Velcade injection while inpatient on 09/10/21.  He tolerated well. -He began daratumumab injection 09/27/21. Chemo was held briefly following stroke 01/14/22. -not a candidate for biphosphonate due to renal failure -he completed 6 cycle induction chemo with CyBorD on 03/21/22 and moved to monthly Dara maintenance on 03/28/22. He completed a total of 2 year therapy in 08/2023 -He has had very good partial response to treatment based on his M protein and light chain level. -we previously discussed options of treatment for relapsed disease  -continue surveillance.

## 2023-12-25 ENCOUNTER — Inpatient Hospital Stay: Payer: Medicare HMO | Attending: Hematology | Admitting: Hematology

## 2023-12-25 VITALS — BP 137/94 | HR 74 | Temp 99.2°F | Resp 15 | Wt 148.9 lb

## 2023-12-25 DIAGNOSIS — E8581 Light chain (AL) amyloidosis: Secondary | ICD-10-CM | POA: Insufficient documentation

## 2023-12-25 DIAGNOSIS — Z992 Dependence on renal dialysis: Secondary | ICD-10-CM | POA: Insufficient documentation

## 2023-12-25 DIAGNOSIS — N186 End stage renal disease: Secondary | ICD-10-CM | POA: Diagnosis not present

## 2023-12-25 NOTE — Progress Notes (Signed)
 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  He tolerated well. -He began daratumumab  injection 09/27/21. Chemo was held briefly following stroke 01/14/22. -not a candidate for biphosphonate due to renal failure -he completed 6 cycle induction chemo with CyBorD on 03/21/22 and moved to monthly Dara maintenance on 03/28/22. He completed a total of 2 year therapy in 08/2023 -He has had very good partial response to treatment based on his M protein and light chain level. -we previously discussed options of treatment for relapsed disease  -continue surveillance.      Assessment and Plan    AL Amyloidosis AL amyloidosis is well-managed. Completed treatment and currently monitored. Lambda light chain levels are slightly elevated but stable. M protein is negative. Kidney and liver functions are stable. Hemoglobin improved from 7.5 in November to 9.5, with no blood transfusions required in the past month. High chance of light chain level increase in the future, potentially requiring additional treatment. - Monitor lambda light chain levels - Schedule follow-up every two months, extend to every three months if stable - Continue monitoring kidney and liver function  Chronic Kidney Failure Chronic kidney failure managed with dialysis. Dialysis  catheter is in good condition with no pain, swelling, or signs of infection. - Continue regular dialysis sessions - Monitor dialysis catheter condition - Schedule port flush every eight weeks  General Health Maintenance Received flu and COVID-19 vaccines. - Ensure all vaccinations are up to date  Plan -Lab reviewed, overall stable, will continue observation. - Schedule next lab and f/u appointment in March.       Discussed the use of AI scribe software for clinical note transcription with the patient, who gave verbal consent to proceed.  History of Present Illness   A 73 year old patient with a history of AL amyloidosis and kidney failure presents for a routine follow-up. The patient reports no new symptoms or changes in his condition over the past few months. He continues to undergo dialysis without any reported issues. The patient denies any pain or discomfort and reports his appetite as fluctuating. The patient's weight has remained stable over the past month. The patient has not required any blood transfusions in the past month and denies any fever or chills. The patient's hemoglobin level has improved from 7.5 to 9.5 since his last visit. The patient's light chain level remains stable, with a slight elevation in the lambda light chain. The patient has completed treatment for multiple myeloma, and the M protein was negative on the most recent panel. The patient's kidney and liver function remain stable, with chronic kidney failure expected to persist.         All other systems were reviewed with the patient and are negative.  MEDICAL HISTORY:  Past Medical History:  Diagnosis Date   Cancer [facility])     SURGICAL HISTORY: Past Surgical History:  Procedure Laterality Date  APPENDECTOMY     IR ANGIO INTRA EXTRACRAN SEL COM CAROTID INNOMINATE BILAT MOD SED  01/16/2022   IR ANGIO INTRA EXTRACRAN SEL COM CAROTID INNOMINATE BILAT MOD SED  07/16/2022   IR ANGIO VERTEBRAL SEL SUBCLAVIAN  INNOMINATE BILAT MOD SED  01/16/2022   IR ANGIO VERTEBRAL SEL SUBCLAVIAN INNOMINATE UNI R MOD SED  07/16/2022   IR ANGIO VERTEBRAL SEL VERTEBRAL UNI L MOD SED  07/16/2022   IR CT HEAD LTD  01/18/2022   IR EMBO ART  VEN HEMORR LYMPH EXTRAV  INC GUIDE ROADMAPPING  08/30/2021   IR FLUORO GUIDE CV LINE RIGHT  08/30/2021   IR FLUORO GUIDE CV LINE RIGHT  09/18/2021   IR FLUORO GUIDE CV LINE RIGHT  05/02/2022   IR FLUORO GUIDE CV LINE RIGHT  10/31/2023   IR PERCUTANEOUS ART THROMBECTOMY/INFUSION INTRACRANIAL INC DIAG ANGIO  01/18/2022   IR RADIOLOGIST EVAL & MGMT  08/02/2022   IR REMOVAL TUN CV CATH W/O FL  10/28/2023   IR RENAL SELECTIVE  UNI INC S&I MOD SED  08/31/2021   IR US  GUIDE VASC ACCESS RIGHT  08/30/2021   IR US  GUIDE VASC ACCESS RIGHT  08/30/2021   IR US  GUIDE VASC ACCESS RIGHT  09/18/2021   IR US  GUIDE VASC ACCESS RIGHT  01/16/2022   IR US  GUIDE VASC ACCESS RIGHT  07/16/2022   IR US  GUIDE VASC ACCESS RIGHT  10/31/2023   IR VENOCAVAGRAM SVC  05/02/2022   RADIOLOGY WITH ANESTHESIA N/A 01/18/2022   Procedure: Cerebral angioplasty with possible stenting;  Surgeon: [name] Carrion, MD;  Location: [facility] OR;  Service: Radiology;  Laterality: N/A;   TRANSESOPHAGEAL ECHOCARDIOGRAM (CATH LAB) N/A 10/30/2023   Procedure: TRANSESOPHAGEAL ECHOCARDIOGRAM;  Surgeon: Michele Richardson, DO;  Location: [facility] INVASIVE CV LAB;  Service: Cardiovascular;  Laterality: N/A;    I have reviewed the social history and family history with the patient and they are unchanged from previous note.  ALLERGIES:  is allergic to iron sucrose.  MEDICATIONS:  Current Outpatient Medications  Medication Sig Dispense Refill   acetaminophen  (TYLENOL ) 500 MG tablet Take 1,000 mg by mouth every 6 (six) hours as needed for moderate pain.     ALPRAZolam  (XANAX ) 1 MG tablet Take 1 tablet (1 mg total) by mouth 3 (three) times daily as needed for anxiety or sleep. 90 tablet 0   aspirin  81 MG EC tablet Take 1 tablet (81 mg total) by mouth daily. Swallow whole.  30 tablet 11   ferrous sulfate  325 (65 FE) MG EC tablet Take 1 tablet (325 mg total) by mouth every other day.     fluticasone  (FLONASE ) 50 MCG/ACT nasal spray Place 1 spray into both nostrils daily.     folic acid  (FOLVITE ) 1 MG tablet TAKE 2 TABLETS BY MOUTH ONCE DAILY 60 tablet 0   metoprolol  succinate (TOPROL -XL) 25 MG 24 hr tablet TAKE 1/2 TABLET BY MOUTH EVERY DAY 15 tablet 4   mirtazapine  (REMERON ) 30 MG tablet TAKE 1 TABLET BY MOUTH AT BEDTIME 30 tablet 1   Nutritional Supplements (FEEDING SUPPLEMENT, NEPRO CARB STEADY,) LIQD Take 237 mLs by mouth 2 (two) times daily between meals. (Patient not taking: Reported on 10/27/2023)     oxyCODONE  (ROXICODONE ) 15 MG immediate release tablet Take 1 tablet (15 mg total) by mouth every 4 (four) hours as needed. 90 tablet 0   pantoprazole  (PROTONIX ) 40 MG tablet TAKE 1 TABLET BY MOUTH 2 TIMES DAILY BEFORE MEALS 60 tablet 0   SENNA PLUS 8.6-50 MG  tablet Take 1 tablet by mouth daily. (Patient not taking: Reported on 11/11/2023)     tamsulosin  (FLOMAX ) 0.4 MG CAPS capsule TAKE 1 CAPSULE BY MOUTH ONCE DAILY 30 capsule 0   zolpidem  (AMBIEN ) 5 MG tablet TAKE 1 TABLET BY MOUTH NIGHTLY AT BEDTIME AS NEEDED FOR SLEEP 30 tablet 1   No current facility-administered medications for this visit.   Facility-Administered Medications Ordered in Other Visits  Medication Dose Route Frequency Provider Last Rate Last Admin   0.9 %  sodium chloride  infusion (Manually program via Guardrails IV Fluids)  250 mL Intravenous Once Lanny Callander, MD       heparin  lock flush 100 unit/mL  500 Units Intracatheter Once Lanny Callander, MD       sodium chloride  flush (NS) 0.9 % injection 10 mL  10 mL Intracatheter Once Lanny Callander, MD        PHYSICAL EXAMINATION: ECOG PERFORMANCE STATUS: 2 - Symptomatic, <50% confined to bed  Vitals:   12/25/23 0851  BP: (!) 137/94  Pulse: 74  Resp: 15  Temp: 99.2 F (37.3 C)  SpO2: 100%   Wt Readings from Last 3 Encounters:  12/25/23 148 lb 14.4  oz (67.5 kg)  12/11/23 148 lb 11.2 oz (67.4 kg)  11/18/23 143 lb (64.9 kg)    Physical Exam   MEASUREMENTS: WT- 149 CHEST: clear to auscultation ABDOMEN: non-tender on palpation GENITOURINARY: dialysis catheter site without erythema, discharge, or tenderness EXTREMITIES: no edema NEUROLOGICAL: no neuropathy      LABORATORY DATA:  I have reviewed the data as listed    Latest Ref Rng & Units 12/11/2023    8:14 AM 11/11/2023   10:50 AM 10/30/2023    6:02 AM  CBC  WBC 4.0 - 10.5 K/uL 7.8  6.4  10.2   Hemoglobin 13.0 - 17.0 g/dL 9.5  7.5  8.5   Hematocrit 39.0 - 52.0 % 29.1  23.9  26.6   Platelets 150 - 400 K/uL 163  179  180         Latest Ref Rng & Units 12/11/2023    8:14 AM 11/11/2023   10:50 AM 10/29/2023    3:54 AM  CMP  Glucose 70 - 99 mg/dL 88  881  885   BUN 8 - 23 mg/dL 54  34  39   Creatinine 0.61 - 1.24 mg/dL 4.51  4.54  5.59   Sodium 135 - 145 mmol/L 137  137  136   Potassium 3.5 - 5.1 mmol/L 4.7  4.1  3.5   Chloride 98 - 111 mmol/L 104  100  104   CO2 22 - 32 mmol/L 23  29  24    Calcium  8.9 - 10.3 mg/dL 8.4  8.4  7.3   Total Protein 6.5 - 8.1 g/dL 6.0  6.0    Total Bilirubin <1.2 mg/dL 0.5  0.3    Alkaline Phos 38 - 126 U/L 90  85    AST 15 - 41 U/L 7  8    ALT 0 - 44 U/L <5  <5        RADIOGRAPHIC STUDIES: I have personally reviewed the radiological images as listed and agreed with the findings in the report. No results found.    No orders of the defined types were placed in this encounter.  All questions were answered. The patient knows to call the clinic with any problems, questions or concerns. No barriers to learning was detected. The total time spent in the appointment was  20 minutes.     Onita Mattock, MD 12/25/2023

## 2023-12-30 ENCOUNTER — Other Ambulatory Visit (HOSPITAL_COMMUNITY): Payer: Self-pay | Admitting: Cardiology

## 2024-01-02 ENCOUNTER — Other Ambulatory Visit: Payer: Self-pay | Admitting: Nurse Practitioner

## 2024-01-02 DIAGNOSIS — G893 Neoplasm related pain (acute) (chronic): Secondary | ICD-10-CM

## 2024-01-02 DIAGNOSIS — Z515 Encounter for palliative care: Secondary | ICD-10-CM

## 2024-01-02 DIAGNOSIS — E8581 Light chain (AL) amyloidosis: Secondary | ICD-10-CM

## 2024-01-02 DIAGNOSIS — E854 Organ-limited amyloidosis: Secondary | ICD-10-CM

## 2024-01-02 DIAGNOSIS — F419 Anxiety disorder, unspecified: Secondary | ICD-10-CM

## 2024-01-02 MED ORDER — OXYCODONE HCL 15 MG PO TABS: 15.0000 mg | ORAL_TABLET | ORAL | 0 refills | Status: DC | PRN

## 2024-01-02 MED ORDER — ALPRAZOLAM 1 MG PO TABS: 1.0000 mg | ORAL_TABLET | Freq: Three times a day (TID) | ORAL | 0 refills | Status: DC | PRN

## 2024-01-06 ENCOUNTER — Telehealth: Payer: Self-pay | Admitting: Hematology

## 2024-01-06 NOTE — Telephone Encounter (Signed)
 Left patient a voicemail in regards to scheduled appointment; left callback number if patient is needing scheduling

## 2024-01-07 ENCOUNTER — Other Ambulatory Visit: Payer: Self-pay | Admitting: Hematology

## 2024-01-26 NOTE — Progress Notes (Signed)
 Palliative Medicine [facility]  Telephone:(336) [phone] Fax:(336) [phone]   Name: Caleb Simmons Date: 01/26/2024 MRN: [id number]  DOB: Jun 30, 1951  Patient Care Team: Caleb Callander, MD as PCP - General (Hematology) Buntin, Caleb LITTIE, RN (Inactive) as Registered Nurse Pickenpack-Cousar, Caleb SAILOR, NP as Nurse Practitioner (Nurse Practitioner) Caleb Callander, MD as Attending Physician (Hematology and Oncology)   INTERVAL HISTORY: Caleb Simmons is a 73 y.o. 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  Recently admitted and discharged on 01/20/22 after receiving treatment for left MCA CVA. Palliative following for ongoing goals of care discussions and symptom management.   SOCIAL HISTORY:     reports that he has been smoking cigarettes. He has a 6.3 pack-year smoking history. He has never used smokeless tobacco.  ADVANCE DIRECTIVES:  Patient reports completed document.  Son Caleb, Simmons. is his healthcare power of attorney.  CODE STATUS: Full code  PAST MEDICAL HISTORY: Past Medical History:  Diagnosis Date   Cancer (HCC)     ALLERGIES:  is allergic to iron sucrose.  MEDICATIONS:  Current Outpatient Medications  Medication Sig Dispense Refill   acetaminophen  (TYLENOL ) 500 MG tablet Take 1,000 mg by mouth every 6 (six) hours as needed for moderate pain.     ALPRAZolam  (XANAX ) 1 MG tablet Take 1 tablet (1 mg total) by mouth 3 (three) times daily as needed for anxiety or sleep. 90 tablet 0   aspirin  81 MG EC tablet Take 1 tablet (81 mg total) by mouth daily. Swallow whole. 30 tablet 11   ferrous sulfate  325 (65 FE) MG EC tablet Take 1 tablet (325 mg total) by mouth every other day.     fluticasone  (FLONASE ) 50 MCG/ACT nasal spray Place 1 spray into both nostrils daily.     folic acid  (FOLVITE ) 1 MG tablet TAKE 2 TABLETS BY MOUTH ONCE DAILY 60  tablet 0   metoprolol  succinate (TOPROL -XL) 25 MG 24 hr tablet TAKE 1/2 TABLET BY MOUTH ONCE DAILY 45 tablet 0   mirtazapine  (REMERON ) 30 MG tablet TAKE 1 TABLET BY MOUTH AT BEDTIME 30 tablet 1   Nutritional Supplements (FEEDING SUPPLEMENT, NEPRO CARB STEADY,) LIQD Take 237 mLs by mouth 2 (two) times daily between meals. (Patient not taking: Reported on 10/27/2023)     oxyCODONE  (ROXICODONE ) 15 MG immediate release tablet Take 1 tablet (15 mg total) by mouth every 4 (four) hours as needed. 90 tablet 0   pantoprazole  (PROTONIX ) 40 MG tablet TAKE 1 TABLET BY MOUTH 2 TIMES DAILY BEFORE MEALS 60 tablet 0   SENNA PLUS 8.6-50 MG tablet Take 1 tablet by mouth daily. (Patient not taking: Reported on 11/11/2023)     tamsulosin  (FLOMAX ) 0.4 MG CAPS capsule TAKE 1 CAPSULE BY MOUTH ONCE DAILY 30 capsule 0   zolpidem  (AMBIEN ) 5 MG tablet TAKE 1 TABLET BY MOUTH NIGHTLY AT BEDTIME AS NEEDED FOR SLEEP 30 tablet 1   No current facility-administered medications for this visit.   Facility-Administered Medications Ordered in Other Visits  Medication Dose Route Frequency Provider Last Rate Last Admin   0.9 %  sodium chloride  infusion (Manually program via Guardrails IV Fluids)  250 mL Intravenous Once Caleb Callander, MD       heparin  lock flush 100 unit/mL  500 Units Intracatheter Once Caleb Callander, MD       sodium chloride  flush (NS) 0.9 % injection 10 mL  10 mL Intracatheter Once Caleb Callander, MD        VITAL SIGNS: There were no vitals taken for this visit. There were no vitals filed for this visit.    Estimated body mass index is 21.36 kg/m as calculated from the following:   Height as of 11/18/23: 5' 10 (1.778 m).   Weight as of 12/25/23: 148 lb 14.4 oz (67.5 kg).   PERFORMANCE STATUS (ECOG) : 2 - Symptomatic, <50% confined to bed  Assessment NAD, in wheelchair  RRR Normal breathing pattern AAO x3  Discussed the use of AI scribe software for clinical note transcription with the patient, who gave verbal  consent to proceed.   IMPRESSION:   [name] presents to clinic for symptom management follow-up. He is doing well overall. Denies nausea, vomiting, constipation, or diarrhea. Ongoing fatigue. He undergoes dialysis on Mondays and Fridays, which results in significant fatigue, often requiring him to sleep or rest for the remainder of the day. His appetite is stable, and his weight has remained consistent. He typically eats breakfast if it is prepared for him, usually consuming two servings. Weight is stable.   He experiences chronic pain primarily due to medical conditions, which is exacerbated by fluctuating weather conditions described as 'hot and cold.' He has a history of multiple bone fractures, contributing to his discomfort. Due to pain and feeling cold, he spends most of his time in a chair, which limits his mobility on some days.   He is currently taking oxycodone  for pain management and requests a refill. Pain is well controlled on regimen. Does not require medication daily. Additionally, he takes Xanax  for anxiety. He also takes mirtazapine , 30 mg at bedtime, which aids in appetite and sleep. There was a previous issue with obtaining a Marinol  which has been on back order. Feels this worked the best for his appetite.   All questions answered and support provided. We will continue to closely monitor and support.  Assessment and Plan  Chronic Cancer Related Pain Patient reports ongoing pain, likely related to arthritis, previous fractures, and cancer. Currently well managed with Oxycodone . -Continue Oxycodone  15mg  every 4 hours as needed. Does not require daily.  -Refill Oxycodone  prescription.  Anxiety Managed with Xanax . -Refill Xanax  prescription as needed.   Appetite and Sleep Possible issues with appetite and sleep. Patient may be taking Mirtazapine  (Remeron ) 30mg  at night, but this is unclear. -Mirtazapine  (Remeron ) 30 mg at bedtime.  -Weight is stable.   End Stage  Renal Disease Patient on dialysis on Mondays and Fridays. No reported issues. -Continue current dialysis schedule.  Follow-up Next appointment scheduled for March 5th, 2025.  Patient expressed understanding and was in agreement with this plan. He also understands that He can call the clinic at any time with any questions, concerns, or complaints.     Any controlled substances utilized were prescribed in the context of palliative care. PDMP has been reviewed.    Visit consisted of counseling and education dealing with the complex and emotionally intense issues of symptom management and palliative care in the setting of serious and potentially life-threatening illness.  Levon Borer, AGPCNP-BC  Palliative Medicine Team/[facility]

## 2024-01-28 ENCOUNTER — Inpatient Hospital Stay: Payer: Medicare HMO | Attending: Hematology | Admitting: Nurse Practitioner

## 2024-01-28 ENCOUNTER — Encounter: Payer: Self-pay | Admitting: Nurse Practitioner

## 2024-01-28 VITALS — BP 146/78 | HR 74 | Temp 98.3°F | Resp 16 | Wt 148.6 lb

## 2024-01-28 DIAGNOSIS — R630 Anorexia: Secondary | ICD-10-CM

## 2024-01-28 DIAGNOSIS — E854 Organ-limited amyloidosis: Secondary | ICD-10-CM | POA: Diagnosis not present

## 2024-01-28 DIAGNOSIS — Z515 Encounter for palliative care: Secondary | ICD-10-CM

## 2024-01-28 DIAGNOSIS — G893 Neoplasm related pain (acute) (chronic): Secondary | ICD-10-CM

## 2024-01-28 DIAGNOSIS — E8581 Light chain (AL) amyloidosis: Secondary | ICD-10-CM | POA: Diagnosis not present

## 2024-01-28 DIAGNOSIS — I43 Cardiomyopathy in diseases classified elsewhere: Secondary | ICD-10-CM

## 2024-01-28 DIAGNOSIS — F419 Anxiety disorder, unspecified: Secondary | ICD-10-CM

## 2024-01-28 MED ORDER — OXYCODONE HCL 15 MG PO TABS: 15.0000 mg | ORAL_TABLET | ORAL | 0 refills | Status: DC | PRN

## 2024-01-28 MED ORDER — ALPRAZOLAM 1 MG PO TABS: 1.0000 mg | ORAL_TABLET | Freq: Three times a day (TID) | ORAL | 0 refills | Status: DC | PRN

## 2024-02-04 ENCOUNTER — Other Ambulatory Visit: Payer: Self-pay | Admitting: Nurse Practitioner

## 2024-02-04 DIAGNOSIS — Z515 Encounter for palliative care: Secondary | ICD-10-CM

## 2024-02-04 DIAGNOSIS — E8581 Light chain (AL) amyloidosis: Secondary | ICD-10-CM

## 2024-02-04 DIAGNOSIS — G4700 Insomnia, unspecified: Secondary | ICD-10-CM

## 2024-02-04 DIAGNOSIS — E854 Organ-limited amyloidosis: Secondary | ICD-10-CM

## 2024-02-04 DIAGNOSIS — I43 Cardiomyopathy in diseases classified elsewhere: Secondary | ICD-10-CM

## 2024-02-23 NOTE — Assessment & Plan Note (Signed)
-  lamda light chain disease with renal, cardiac and neuro involvement   -diagnosed in 08/2021 -not a candidate for transplant  --He began weekly oral Cytoxan, dexa and Velcade injection while inpatient on 09/10/21.  He tolerated well. -He began daratumumab injection 09/27/21. Chemo was held briefly following stroke 01/14/22. -not a candidate for biphosphonate due to renal failure -he completed 6 cycle induction chemo with CyBorD on 03/21/22 and moved to monthly Dara maintenance on 03/28/22. He completed a total of 2 year therapy in 08/2023 -He has had very good partial response to treatment based on his M protein and light chain level. -we previously discussed options of treatment for relapsed disease  -continue surveillance.

## 2024-02-23 NOTE — Assessment & Plan Note (Signed)
-  on HD MF  

## 2024-02-23 NOTE — Assessment & Plan Note (Signed)
-   fu with cardiology

## 2024-02-24 NOTE — Progress Notes (Unsigned)
 Palliative Medicine [facility]  Telephone:(336) [phone] Fax:(336) [phone]   Name: Caleb Simmons Date: 02/24/2024 MRN: [id number]  DOB: 1951/03/24  Patient Care Team: [name], MD as PCP - General (Hematology) Buntin, Daisy Floro, RN (Inactive) as Registered Nurse Pickenpack-Cousar, Arty Baumgartner, NP as Nurse Practitioner (Nurse Practitioner) [name], MD as Attending Physician (Hematology and Oncology)   INTERVAL HISTORY: Caleb Simmons is a 73 y.o. 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  Recently admitted and discharged on 01/20/22 after receiving treatment for left MCA CVA. Palliative following for ongoing goals of care discussions and symptom management.   SOCIAL HISTORY:     reports that he has been smoking cigarettes. He has a 6.3 pack-year smoking history. He has never used smokeless tobacco.  ADVANCE DIRECTIVES:  Patient reports completed document.  Son Rose, Hippler. is his healthcare power of attorney.  CODE STATUS: Full code  PAST MEDICAL HISTORY: Past Medical History:  Diagnosis Date   Cancer (HCC)     ALLERGIES:  is allergic to iron sucrose.  MEDICATIONS:  Current Outpatient Medications  Medication Sig Dispense Refill   acetaminophen (TYLENOL) 500 MG tablet Take 1,000 mg by mouth every 6 (six) hours as needed for moderate pain.     ALPRAZolam (XANAX) 1 MG tablet Take 1 tablet (1 mg total) by mouth 3 (three) times daily as needed for anxiety or sleep. 90 tablet 0   aspirin 81 MG EC tablet Take 1 tablet (81 mg total) by mouth daily. Swallow whole. 30 tablet 11   ferrous sulfate 325 (65 FE) MG EC tablet Take 1 tablet (325 mg total) by mouth every other day.     fluticasone (FLONASE) 50 MCG/ACT nasal spray Place 1 spray into both nostrils daily.     folic acid (FOLVITE) 1 MG tablet TAKE 2 TABLETS BY MOUTH ONCE DAILY 60  tablet 0   metoprolol succinate (TOPROL-XL) 25 MG 24 hr tablet TAKE 1/2 TABLET BY MOUTH ONCE DAILY 45 tablet 0   mirtazapine (REMERON) 30 MG tablet TAKE 1 TABLET BY MOUTH AT BEDTIME 30 tablet 1   Nutritional Supplements (FEEDING SUPPLEMENT, NEPRO CARB STEADY,) LIQD Take 237 mLs by mouth 2 (two) times daily between meals. (Patient not taking: Reported on 10/27/2023)     oxyCODONE (ROXICODONE) 15 MG immediate release tablet Take 1 tablet (15 mg total) by mouth every 4 (four) hours as needed. 90 tablet 0   pantoprazole (PROTONIX) 40 MG tablet TAKE 1 TABLET BY MOUTH 2 TIMES DAILY BEFORE MEALS 60 tablet 0   SENNA PLUS 8.6-50 MG tablet Take 1 tablet by mouth daily. (Patient not taking: Reported on 11/11/2023)     tamsulosin (FLOMAX) 0.4 MG CAPS capsule TAKE 1 CAPSULE BY MOUTH ONCE DAILY 30 capsule 0   zolpidem (AMBIEN) 5 MG tablet Take 1 tablet (5 mg total) by mouth at bedtime as needed. for sleep 30 tablet 3   No current facility-administered medications for this visit.   Facility-Administered Medications Ordered in Other Visits  Medication Dose Route Frequency Provider Last Rate Last Admin   0.9 %  sodium chloride infusion (Manually program via Guardrails IV Fluids)  250 mL Intravenous Once [name], MD       heparin lock flush 100 unit/mL  500 Units Intracatheter Once [name], MD       sodium chloride flush (NS) 0.9 % injection 10  mL  10 mL Intracatheter Once [name], MD        VITAL SIGNS: There were no vitals taken for this visit. There were no vitals filed for this visit.    Estimated body mass index is 21.32 kg/m as calculated from the following:   Height as of 11/18/23: 5\' 10"  (1.778 m).   Weight as of 01/28/24: 148 lb 9.6 oz (67.4 kg).   PERFORMANCE STATUS (ECOG) : 2 - Symptomatic, <50% confined to bed  Assessment NAD, in wheelchair  RRR Normal breathing pattern AAO x3  Discussed the use of AI scribe software for clinical note transcription with the patient, who gave verbal  consent to proceed.   IMPRESSION:  Caleb Simmons is a 73 year old male who presents for medication refills and symptom management. His Significant Other, [name] is present. Patient is doing well overall. Tolerating dialysis twice weekly.   His appetite is inconsistent, described as 'up and down,' with some days being better than others.  He has been unable to obtain Marinol recently, which he uses for appetite stimulation. His weight has increased from 148 lbs on February 5th to 154 lbs today, indicating some improvement in nutritional status. He is hopeful pharmacy will have his Marinol back in stock. We will send prescription to his pharmacy.   He experiences poor sleep, frequently waking up at night. He is currently taking mirtazapine 30 mg at bedtime to aid with sleep and anxiety management. He is also taking alprazolam (Xanax) to manage anxiety and irritability. No issues with constipation or diarrhea. Occasional aches and pains which are controlled with oxycodone. He does not require usage around the clock.   Overall Mr. Bos feels his symptoms are well managed. No adjustments at this time. Will continue to support and follow.  Assessment and Plan  Chronic Cancer Related Pain Stable on current regimen. No constipation reported. -Continue Oxycodone 15mg  as needed.   Poor Appetite Fluctuating appetite with some improvement. Weight increased from 148 to 154 since last visit. -Attempt to refill Marinol 5mg  prescription.  Insomnia Persistent difficulty sleeping. -Continue current regimen which includes Mirtazapine 30mg  at bedtime.   Anxiety Managed with Xanax. -Continue Xanax 0.25mg  as needed.  Dialysis Ongoing, twice weekly. -Continue current schedule.  Nausea Prescription for Compazine sent by Dr. Mosetta Putt.   Follow-up in approximately six weeks by phone, and in-person in three months.  Patient expressed understanding and was in agreement with this plan. He also understands  that He can call the clinic at any time with any questions, concerns, or complaints.     Any controlled substances utilized were prescribed in the context of palliative care. PDMP has been reviewed.    Visit consisted of counseling and education dealing with the complex and emotionally intense issues of symptom management and palliative care in the setting of serious and potentially life-threatening illness.  Willette Alma, AGPCNP-BC  Palliative Medicine Team/[facility]

## 2024-02-25 ENCOUNTER — Encounter: Payer: Self-pay | Admitting: Hematology

## 2024-02-25 ENCOUNTER — Inpatient Hospital Stay (HOSPITAL_BASED_OUTPATIENT_CLINIC_OR_DEPARTMENT_OTHER): Payer: Medicare HMO | Admitting: Hematology

## 2024-02-25 ENCOUNTER — Inpatient Hospital Stay (HOSPITAL_BASED_OUTPATIENT_CLINIC_OR_DEPARTMENT_OTHER): Payer: Medicare HMO | Admitting: Nurse Practitioner

## 2024-02-25 ENCOUNTER — Inpatient Hospital Stay: Payer: Medicare HMO | Attending: Hematology

## 2024-02-25 ENCOUNTER — Other Ambulatory Visit: Payer: Self-pay

## 2024-02-25 ENCOUNTER — Encounter: Payer: Self-pay | Admitting: Nurse Practitioner

## 2024-02-25 VITALS — BP 146/92 | HR 91 | Temp 98.3°F | Resp 17 | Ht 70.0 in | Wt 154.0 lb

## 2024-02-25 DIAGNOSIS — N186 End stage renal disease: Secondary | ICD-10-CM

## 2024-02-25 DIAGNOSIS — Z992 Dependence on renal dialysis: Secondary | ICD-10-CM | POA: Diagnosis not present

## 2024-02-25 DIAGNOSIS — Z79899 Other long term (current) drug therapy: Secondary | ICD-10-CM | POA: Diagnosis not present

## 2024-02-25 DIAGNOSIS — F419 Anxiety disorder, unspecified: Secondary | ICD-10-CM | POA: Diagnosis not present

## 2024-02-25 DIAGNOSIS — R530 Neoplastic (malignant) related fatigue: Secondary | ICD-10-CM | POA: Diagnosis not present

## 2024-02-25 DIAGNOSIS — Z7982 Long term (current) use of aspirin: Secondary | ICD-10-CM | POA: Insufficient documentation

## 2024-02-25 DIAGNOSIS — E8581 Light chain (AL) amyloidosis: Secondary | ICD-10-CM

## 2024-02-25 DIAGNOSIS — I43 Cardiomyopathy in diseases classified elsewhere: Secondary | ICD-10-CM

## 2024-02-25 DIAGNOSIS — E854 Organ-limited amyloidosis: Secondary | ICD-10-CM

## 2024-02-25 DIAGNOSIS — G893 Neoplasm related pain (acute) (chronic): Secondary | ICD-10-CM | POA: Diagnosis not present

## 2024-02-25 DIAGNOSIS — Z515 Encounter for palliative care: Secondary | ICD-10-CM

## 2024-02-25 DIAGNOSIS — D638 Anemia in other chronic diseases classified elsewhere: Secondary | ICD-10-CM

## 2024-02-25 LAB — CBC WITH DIFFERENTIAL (CANCER CENTER ONLY)
Abs Immature Granulocytes: 0.03 10*3/uL (ref 0.00–0.07)
Basophils Absolute: 0.1 10*3/uL (ref 0.0–0.1)
Basophils Relative: 1 %
Eosinophils Absolute: 0.2 10*3/uL (ref 0.0–0.5)
Eosinophils Relative: 2 %
HCT: 30 % — ABNORMAL LOW (ref 39.0–52.0)
Hemoglobin: 9.6 g/dL — ABNORMAL LOW (ref 13.0–17.0)
Immature Granulocytes: 0 %
Lymphocytes Relative: 24 %
Lymphs Abs: 2.1 10*3/uL (ref 0.7–4.0)
MCH: 29.2 pg (ref 26.0–34.0)
MCHC: 32 g/dL (ref 30.0–36.0)
MCV: 91.2 fL (ref 80.0–100.0)
Monocytes Absolute: 0.7 10*3/uL (ref 0.1–1.0)
Monocytes Relative: 8 %
Neutro Abs: 5.6 10*3/uL (ref 1.7–7.7)
Neutrophils Relative %: 65 %
Platelet Count: 216 10*3/uL (ref 150–400)
RBC: 3.29 MIL/uL — ABNORMAL LOW (ref 4.22–5.81)
RDW: 15.6 % — ABNORMAL HIGH (ref 11.5–15.5)
WBC Count: 8.7 10*3/uL (ref 4.0–10.5)
nRBC: 0 % (ref 0.0–0.2)

## 2024-02-25 LAB — CMP (CANCER CENTER ONLY)
ALT: 5 U/L (ref 0–44)
AST: 7 U/L — ABNORMAL LOW (ref 15–41)
Albumin: 4 g/dL (ref 3.5–5.0)
Alkaline Phosphatase: 87 U/L (ref 38–126)
Anion gap: 12 (ref 5–15)
BUN: 44 mg/dL — ABNORMAL HIGH (ref 8–23)
CO2: 25 mmol/L (ref 22–32)
Calcium: 8.6 mg/dL — ABNORMAL LOW (ref 8.9–10.3)
Chloride: 99 mmol/L (ref 98–111)
Creatinine: 5.91 mg/dL — ABNORMAL HIGH (ref 0.61–1.24)
GFR, Estimated: 9 mL/min — ABNORMAL LOW (ref >60)
Glucose, Bld: 96 mg/dL (ref 70–99)
Potassium: 4.4 mmol/L (ref 3.5–5.1)
Sodium: 136 mmol/L (ref 135–145)
Total Bilirubin: 0.4 mg/dL (ref 0.0–1.2)
Total Protein: 6.5 g/dL (ref 6.5–8.1)

## 2024-02-25 LAB — FERRITIN: Ferritin: 424 ng/mL — ABNORMAL HIGH (ref 24–336)

## 2024-02-25 MED ORDER — PROCHLORPERAZINE MALEATE 5 MG PO TABS: 5.0000 mg | ORAL_TABLET | Freq: Three times a day (TID) | ORAL | 1 refills | Status: DC | PRN

## 2024-02-25 NOTE — Progress Notes (Signed)
 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  He tolerated well. -He began daratumumab injection 09/27/21. Chemo was held briefly following stroke 01/14/22. -not a candidate for biphosphonate due to renal failure -he completed 6 cycle induction chemo with CyBorD on 03/21/22 and moved to monthly Dara maintenance on 03/28/22. He completed a total of 2 year therapy in 08/2023 -He has had very good partial response to treatment based on his M protein and light chain level. -we previously discussed options of treatment for relapsed disease  -continue surveillance.    ESRD (end stage renal disease) (HCC) -on HD MF      Assessment and Plan    Amyloidosis He reports no new symptoms, pain, or discomfort. His weight has increased since the last visit. M protein is negative, and light chain levels are stable. There is a high risk of requiring treatment in the future, but currently, no treatment is necessary. - Monitor light chain levels and M protein - Schedule follow-up appointment in three months - Order labs to check kidney and liver function, and multiple myeloma panel - Call with lab results within two weeks - Add additional lab tests in six weeks if light chain levels trend  up  Anemia Hemoglobin level is 9.6, indicating anemia. It is unclear if he is receiving treatment for anemia during dialysis.  Chronic Kidney Disease on Dialysis Undergoing dialysis twice a week without issues of hypotension or dizziness, which were problems in the past. He is under regular nephrology care.  Nausea Experiences nausea and uses Compazine (prochlorperazine) for relief. The medication was not on his current medication list and has not been filled since September. He finds Compazine effective as it helps him eat when nauseous. - Prescribe Compazine 5 mg, up to two tablets as needed for nausea - Send prescription to [facility]  Cardiology Follow-up Has not seen a cardiologist since a hospital visit in November 2023. - Send message to cardiologist for follow-up      Plan -Lab reviewed, overall stable, will call him when multiple myeloma panel and light chain level come back -Lab and follow-up in 3 months, sooner if his light chain level significantly elevated  Discussed the use of AI scribe software for clinical note transcription with the patient, who gave verbal consent to proceed.  History of Present Illness   The patient, a 73 year old individual with a history of amyloidosis, presents for a routine follow-up visit. He reports feeling generally well with no new health concerns. He has been maintaining his weight and reports his energy levels as satisfactory. He has not been going out for walks or grocery shopping recently due to unfavorable weather conditions. He denies experiencing any pain, discomfort, shortness of breath, or leg swelling. He is currently on dialysis twice a week and reports no issues with blood pressure drops or  dizziness during treatment, which were problems in the past. He also reports occasional nausea, for which he has been taking a medication that starts with an "O", possibly Ondansetron or Compazine, but he has run out. The patient's family  member requests a refill of this medication.         All other systems were reviewed with the patient and are negative.  MEDICAL HISTORY:  Past Medical History:  Diagnosis Date   Cancer [facility])     SURGICAL HISTORY: Past Surgical History:  Procedure Laterality Date   APPENDECTOMY     IR ANGIO INTRA EXTRACRAN SEL COM CAROTID INNOMINATE BILAT MOD SED  01/16/2022   IR ANGIO INTRA EXTRACRAN SEL COM CAROTID INNOMINATE BILAT MOD SED  07/16/2022   IR ANGIO VERTEBRAL SEL SUBCLAVIAN INNOMINATE BILAT MOD SED  01/16/2022   IR ANGIO VERTEBRAL SEL SUBCLAVIAN INNOMINATE UNI R MOD SED  07/16/2022   IR ANGIO VERTEBRAL SEL VERTEBRAL UNI L MOD SED  07/16/2022   IR CT HEAD LTD  01/18/2022   IR EMBO ART  VEN HEMORR LYMPH EXTRAV  INC GUIDE ROADMAPPING  08/30/2021   IR FLUORO GUIDE CV LINE RIGHT  08/30/2021   IR FLUORO GUIDE CV LINE RIGHT  09/18/2021   IR FLUORO GUIDE CV LINE RIGHT  05/02/2022   IR FLUORO GUIDE CV LINE RIGHT  10/31/2023   IR PERCUTANEOUS ART THROMBECTOMY/INFUSION INTRACRANIAL INC DIAG ANGIO  01/18/2022   IR RADIOLOGIST EVAL & MGMT  08/02/2022   IR REMOVAL TUN CV CATH W/O FL  10/28/2023   IR RENAL SELECTIVE  UNI INC S&I MOD SED  08/31/2021   IR US GUIDE VASC ACCESS RIGHT  08/30/2021   IR US GUIDE VASC ACCESS RIGHT  08/30/2021   IR US GUIDE VASC ACCESS RIGHT  09/18/2021   IR US GUIDE VASC ACCESS RIGHT  01/16/2022   IR US GUIDE VASC ACCESS RIGHT  07/16/2022   IR US GUIDE VASC ACCESS RIGHT  10/31/2023   IR VENOCAVAGRAM SVC  05/02/2022   RADIOLOGY WITH ANESTHESIA N/A 01/18/2022   Procedure: Cerebral angioplasty with possible stenting;  Surgeon: Julieanne Cotton, MD;  Location: [facility] OR;  Service: Radiology;  Laterality: N/A;   TRANSESOPHAGEAL ECHOCARDIOGRAM (CATH LAB) N/A 10/30/2023   Procedure: TRANSESOPHAGEAL ECHOCARDIOGRAM;  Surgeon: Tessa Lerner, DO;  Location: [facility] INVASIVE CV LAB;  Service: Cardiovascular;  Laterality: N/A;    I have reviewed the social history and family history with the patient and they are  unchanged from previous note.  ALLERGIES:  is allergic to iron sucrose.  MEDICATIONS:  Current Outpatient Medications  Medication Sig Dispense Refill   acetaminophen (TYLENOL) 500 MG tablet Take 1,000 mg by mouth every 6 (six) hours as needed for moderate pain.     ALPRAZolam (XANAX) 1 MG tablet Take 1 tablet (1 mg total) by mouth 3 (three) times daily as needed for anxiety or sleep. 90 tablet 0   aspirin 81 MG EC tablet Take 1 tablet (81 mg total) by mouth daily. Swallow whole. 30 tablet 11   ferrous sulfate 325 (65 FE) MG EC tablet Take 1 tablet (325 mg total) by mouth every other day.     fluticasone (FLONASE) 50 MCG/ACT nasal spray Place 1 spray into both nostrils daily.     folic acid (FOLVITE) 1 MG tablet TAKE 2 TABLETS BY MOUTH ONCE DAILY 60 tablet 0   metoprolol succinate (TOPROL-XL) 25 MG 24 hr tablet TAKE 1/2 TABLET BY MOUTH ONCE DAILY 45 tablet 0   mirtazapine (REMERON)  30 MG tablet TAKE 1 TABLET BY MOUTH AT BEDTIME 30 tablet 1   Nutritional Supplements (FEEDING SUPPLEMENT, NEPRO CARB STEADY,) LIQD Take 237 mLs by mouth 2 (two) times daily between meals.     oxyCODONE (ROXICODONE) 15 MG immediate release tablet Take 1 tablet (15 mg total) by mouth every 4 (four) hours as needed. 90 tablet 0   pantoprazole (PROTONIX) 40 MG tablet TAKE 1 TABLET BY MOUTH 2 TIMES DAILY BEFORE MEALS 60 tablet 0   prochlorperazine (COMPAZINE) 5 MG tablet Take 1-2 tablets (5-10 mg total) by mouth every 8 (eight) hours as needed for nausea or vomiting. 30 tablet 1   SENNA PLUS 8.6-50 MG tablet Take 1 tablet by mouth daily.     tamsulosin (FLOMAX) 0.4 MG CAPS capsule TAKE 1 CAPSULE BY MOUTH ONCE DAILY 30 capsule 0   zolpidem (AMBIEN) 5 MG tablet Take 1 tablet (5 mg total) by mouth at bedtime as needed. for sleep 30 tablet 3   No current facility-administered medications for this visit.   Facility-Administered Medications Ordered in Other Visits  Medication Dose Route Frequency Provider Last Rate Last  Admin   0.9 %  sodium chloride infusion (Manually program via Guardrails IV Fluids)  250 mL Intravenous Once [name], MD       heparin lock flush 100 unit/mL  500 Units Intracatheter Once [name], MD       sodium chloride flush (NS) 0.9 % injection 10 mL  10 mL Intracatheter Once [name], MD        PHYSICAL EXAMINATION: ECOG PERFORMANCE STATUS: 2 - Symptomatic, <50% confined to bed  Vitals:   02/25/24 0835  BP: (!) 146/92  Pulse: 91  Resp: 17  Temp: 98.3 F (36.8 C)  SpO2: 97%   Wt Readings from Last 3 Encounters:  02/25/24 154 lb (69.9 kg)  01/28/24 148 lb 9.6 oz (67.4 kg)  12/25/23 148 lb 14.4 oz (67.5 kg)     GENERAL:alert, no distress and comfortable SKIN: skin color, texture, turgor are normal, no rashes or significant lesions EYES: normal, Conjunctiva are pink and non-injected, sclera clear NECK: supple, thyroid normal size, non-tender, without nodularity LYMPH:  no palpable lymphadenopathy in the cervical, axillary  LUNGS: clear to auscultation and percussion with normal breathing effort HEART: regular rate & rhythm and no murmurs and no lower extremity edema ABDOMEN:abdomen soft, non-tender and normal bowel sounds Musculoskeletal:no cyanosis of digits and no clubbing  NEURO: alert & oriented x 3 with fluent speech, no focal motor/sensory deficits  LABORATORY DATA:  I have reviewed the data as listed    Latest Ref Rng & Units 02/25/2024    8:14 AM 12/11/2023    8:14 AM 11/11/2023   10:50 AM  CBC  WBC 4.0 - 10.5 K/uL 8.7  7.8  6.4   Hemoglobin 13.0 - 17.0 g/dL 9.6  9.5  7.5   Hematocrit 39.0 - 52.0 % 30.0  29.1  23.9   Platelets 150 - 400 K/uL 216  163  179         Latest Ref Rng & Units 02/25/2024    8:14 AM 12/11/2023    8:14 AM 11/11/2023   10:50 AM  CMP  Glucose 70 - 99 mg/dL 96  88  161   BUN 8 - 23 mg/dL 44  54  34   Creatinine 0.61 - 1.24 mg/dL 0.96  0.45  4.09   Sodium 135 - 145 mmol/L 136  137  137   Potassium 3.5 -  5.1 mmol/L 4.4  4.7  4.1    Chloride 98 - 111 mmol/L 99  104  100   CO2 22 - 32 mmol/L 25  23  29    Calcium 8.9 - 10.3 mg/dL 8.6  8.4  8.4   Total Protein 6.5 - 8.1 g/dL 6.5  6.0  6.0   Total Bilirubin 0.0 - 1.2 mg/dL 0.4  0.5  0.3   Alkaline Phos 38 - 126 U/L 87  90  85   AST 15 - 41 U/L 7  7  8    ALT 0 - 44 U/L 5  <5  <5       RADIOGRAPHIC STUDIES: I have personally reviewed the radiological images as listed and agreed with the findings in the report. No results found.    No orders of the defined types were placed in this encounter.  All questions were answered. The patient knows to call the clinic with any problems, questions or concerns. No barriers to learning was detected. The total time spent in the appointment was 20 minutes.     [name], MD 02/25/2024

## 2024-02-26 ENCOUNTER — Other Ambulatory Visit: Payer: Self-pay | Admitting: Nurse Practitioner

## 2024-02-26 ENCOUNTER — Other Ambulatory Visit (HOSPITAL_COMMUNITY): Payer: Self-pay

## 2024-02-26 ENCOUNTER — Other Ambulatory Visit: Payer: Self-pay

## 2024-02-26 DIAGNOSIS — G893 Neoplasm related pain (acute) (chronic): Secondary | ICD-10-CM

## 2024-02-26 DIAGNOSIS — E8581 Light chain (AL) amyloidosis: Secondary | ICD-10-CM

## 2024-02-26 LAB — KAPPA/LAMBDA LIGHT CHAINS
Kappa free light chain: 62.3 mg/L — ABNORMAL HIGH (ref 3.3–19.4)
Kappa, lambda light chain ratio: 2.22 — ABNORMAL HIGH (ref 0.26–1.65)
Lambda free light chains: 28.1 mg/L — ABNORMAL HIGH (ref 5.7–26.3)

## 2024-02-26 MED ORDER — DRONABINOL 2.5 MG PO CAPS: 2.5000 mg | ORAL_CAPSULE | Freq: Two times a day (BID) | ORAL | 2 refills | Status: DC

## 2024-02-26 MED ORDER — DRONABINOL 2.5 MG PO CAPS
2.5000 mg | ORAL_CAPSULE | Freq: Two times a day (BID) | ORAL | 2 refills | Status: DC
  Filled 2024-02-26: qty 60, 30d supply, fill #0

## 2024-02-26 MED ORDER — OXYCODONE HCL 15 MG PO TABS: 15.0000 mg | ORAL_TABLET | ORAL | 0 refills | Status: DC | PRN

## 2024-02-26 NOTE — Telephone Encounter (Signed)
 Pt SO called for refill of dronabinol. See associated orders

## 2024-02-27 ENCOUNTER — Other Ambulatory Visit: Payer: Self-pay

## 2024-02-29 LAB — MULTIPLE MYELOMA PANEL, SERUM
Albumin SerPl Elph-Mcnc: 3.5 g/dL (ref 2.9–4.4)
Albumin/Glob SerPl: 1.5 (ref 0.7–1.7)
Alpha 1: 0.3 g/dL (ref 0.0–0.4)
Alpha2 Glob SerPl Elph-Mcnc: 0.9 g/dL (ref 0.4–1.0)
B-Globulin SerPl Elph-Mcnc: 0.8 g/dL (ref 0.7–1.3)
Gamma Glob SerPl Elph-Mcnc: 0.3 g/dL — ABNORMAL LOW (ref 0.4–1.8)
Globulin, Total: 2.4 g/dL (ref 2.2–3.9)
IgA: 84 mg/dL (ref 61–437)
IgG (Immunoglobin G), Serum: 457 mg/dL — ABNORMAL LOW (ref 603–1613)
IgM (Immunoglobulin M), Srm: 68 mg/dL (ref 15–143)
Total Protein ELP: 5.9 g/dL — ABNORMAL LOW (ref 6.0–8.5)

## 2024-03-01 ENCOUNTER — Other Ambulatory Visit (HOSPITAL_COMMUNITY): Payer: Self-pay

## 2024-03-03 ENCOUNTER — Other Ambulatory Visit: Payer: Self-pay | Admitting: Hematology

## 2024-03-04 ENCOUNTER — Other Ambulatory Visit: Payer: Self-pay

## 2024-03-19 ENCOUNTER — Other Ambulatory Visit: Payer: Self-pay | Admitting: Nurse Practitioner

## 2024-03-20 ENCOUNTER — Encounter: Payer: Self-pay | Admitting: Hematology

## 2024-03-22 ENCOUNTER — Other Ambulatory Visit: Payer: Self-pay | Admitting: Nurse Practitioner

## 2024-03-22 DIAGNOSIS — E854 Organ-limited amyloidosis: Secondary | ICD-10-CM

## 2024-03-22 DIAGNOSIS — G893 Neoplasm related pain (acute) (chronic): Secondary | ICD-10-CM

## 2024-03-22 DIAGNOSIS — E8581 Light chain (AL) amyloidosis: Secondary | ICD-10-CM

## 2024-03-22 DIAGNOSIS — F419 Anxiety disorder, unspecified: Secondary | ICD-10-CM

## 2024-03-22 DIAGNOSIS — Z515 Encounter for palliative care: Secondary | ICD-10-CM

## 2024-03-22 MED ORDER — ALPRAZOLAM 1 MG PO TABS: 1.0000 mg | ORAL_TABLET | Freq: Three times a day (TID) | ORAL | 0 refills | Status: DC | PRN

## 2024-03-22 MED ORDER — OXYCODONE HCL 15 MG PO TABS: 15.0000 mg | ORAL_TABLET | ORAL | 0 refills | Status: DC | PRN

## 2024-03-30 ENCOUNTER — Other Ambulatory Visit: Payer: Self-pay | Admitting: Hematology

## 2024-03-30 ENCOUNTER — Other Ambulatory Visit (HOSPITAL_COMMUNITY): Payer: Self-pay | Admitting: Cardiology

## 2024-03-31 ENCOUNTER — Encounter: Payer: Self-pay | Admitting: Hematology

## 2024-04-07 ENCOUNTER — Inpatient Hospital Stay: Attending: Hematology | Admitting: Nurse Practitioner

## 2024-04-07 ENCOUNTER — Encounter: Payer: Self-pay | Admitting: Nurse Practitioner

## 2024-04-07 DIAGNOSIS — G893 Neoplasm related pain (acute) (chronic): Secondary | ICD-10-CM | POA: Diagnosis not present

## 2024-04-07 DIAGNOSIS — Z515 Encounter for palliative care: Secondary | ICD-10-CM | POA: Diagnosis not present

## 2024-04-07 DIAGNOSIS — E8581 Light chain (AL) amyloidosis: Secondary | ICD-10-CM

## 2024-04-07 DIAGNOSIS — R630 Anorexia: Secondary | ICD-10-CM | POA: Diagnosis not present

## 2024-04-08 ENCOUNTER — Encounter: Payer: Self-pay | Admitting: Hematology

## 2024-04-08 NOTE — Progress Notes (Signed)
 Palliative Medicine [facility]  Telephone:(336) [phone] Fax:(336) [phone]   Name: Caleb Simmons Date: 04/08/2024 MRN: [id number]  DOB: September 03, 1951  Patient Care Team: [name], MD as PCP - General (Hematology) Buntin, Daisy Floro, RN (Inactive) as Registered Nurse Pickenpack-Cousar, Arty Baumgartner, NP as Nurse Practitioner (Nurse Practitioner) [name], MD as Attending Physician (Hematology and Oncology)   I connected with Caleb Simmons on 04/07/24 at  3:00 PM EDT by phone and verified that I am speaking with the correct person using two identifiers.   I discussed the limitations, risks, security and privacy concerns of performing an evaluation and management service by telemedicine and the availability of in-person appointments. I also discussed with the patient that there may be a patient responsible charge related to this service. The patient expressed understanding and agreed to proceed.   Other persons participating in the visit and their role in the encounter: [name]    Patient's location: Home Provider's location: [facility]   INTERVAL HISTORY: Caleb Simmons is a 73 y.o. 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  Recently admitted and discharged on 01/20/22 after receiving treatment for left MCA CVA. Palliative following for ongoing goals of care discussions and symptom management.   SOCIAL HISTORY:     reports that he has been smoking cigarettes. He has a 6.3 pack-year smoking history. He has never used smokeless tobacco.  ADVANCE DIRECTIVES:  Patient reports completed document.  Son [name], [name] is his healthcare power of attorney.  CODE STATUS: Full code  PAST MEDICAL HISTORY: Past Medical History:  Diagnosis Date   Cancer (HCC)     ALLERGIES:  is allergic to iron sucrose.  MEDICATIONS:  Current Outpatient Medications   Medication Sig Dispense Refill   acetaminophen (TYLENOL) 500 MG tablet Take 1,000 mg by mouth every 6 (six) hours as needed for moderate pain.     ALPRAZolam (XANAX) 1 MG tablet Take 1 tablet (1 mg total) by mouth 3 (three) times daily as needed for anxiety or sleep. 90 tablet 0   aspirin 81 MG EC tablet Take 1 tablet (81 mg total) by mouth daily. Swallow whole. 30 tablet 11   dronabinol (MARINOL) 2.5 MG capsule Take 1 capsule (2.5 mg total) by mouth 2 (two) times daily before a meal. 60 capsule 2   ferrous sulfate 325 (65 FE) MG EC tablet Take 1 tablet (325 mg total) by mouth every other day.     fluticasone (FLONASE) 50 MCG/ACT nasal spray Place 1 spray into both nostrils daily.     folic acid (FOLVITE) 1 MG tablet TAKE 2 TABLETS BY MOUTH ONCE DAILY 60 tablet 0   metoprolol succinate (TOPROL-XL) 25 MG 24 hr tablet TAKE 1/2 TABLET BY MOUTH ONCE DAILY 30 tablet 0   mirtazapine (REMERON) 30 MG tablet TAKE 1 TABLET BY MOUTH AT BEDTIME 30 tablet 1   Nutritional Supplements (FEEDING SUPPLEMENT, NEPRO CARB STEADY,) LIQD Take 237 mLs by mouth 2 (two) times daily between meals.     oxyCODONE (ROXICODONE) 15 MG immediate release tablet Take 1 tablet (15 mg total) by mouth every 4 (four) hours as needed. 90 tablet 0   pantoprazole (PROTONIX) 40 MG tablet TAKE 1 TABLET BY MOUTH 2 TIMES DAILY BEFORE MEALS 60 tablet 0   prochlorperazine (COMPAZINE) 5 MG tablet TAKE 1 OR 2 TABLETS BY MOUTH EVERY 8 HOURS AS NEEDED FOR  NAUSEA AND VOMITING 30 tablet 1   SENNA PLUS 8.6-50 MG tablet Take 1 tablet by mouth daily.     tamsulosin (FLOMAX) 0.4 MG CAPS capsule TAKE 1 CAPSULE BY MOUTH EVERY DAY 30 capsule 0   zolpidem (AMBIEN) 5 MG tablet Take 1 tablet (5 mg total) by mouth at bedtime as needed. for sleep 30 tablet 3   No current facility-administered medications for this visit.   Facility-Administered Medications Ordered in Other Visits  Medication Dose Route Frequency Provider Last Rate Last Admin   0.9 %  sodium  chloride infusion (Manually program via Guardrails IV Fluids)  250 mL Intravenous Once [name], MD       heparin lock flush 100 unit/mL  500 Units Intracatheter Once [name], MD       sodium chloride flush (NS) 0.9 % injection 10 mL  10 mL Intracatheter Once [name], MD        VITAL SIGNS: There were no vitals taken for this visit. There were no vitals filed for this visit.    Estimated body mass index is 22.1 kg/m as calculated from the following:   Height as of 02/25/24: 5\' 10"  (1.778 m).   Weight as of 02/25/24: 154 lb (69.9 kg).   PERFORMANCE STATUS (ECOG) : 2 - Symptomatic, <50% confined to bed  Discussed the use of AI scribe software for clinical note transcription with the patient, who gave verbal consent to proceed.   IMPRESSION:  I connected by phone with Caleb Simmons for symptom management follow-up.  His Significant Other, [name] is present during the call. Patient is doing well overall. Tolerating dialysis twice weekly. Reports occasional fatigue. Appetite is improving. Some recent nausea however this is controlled with medications.   He experiences poor sleep, frequently waking up at night. He is currently taking mirtazapine 30 mg at bedtime to aid with sleep and anxiety management. He is also taking alprazolam (Xanax) to manage anxiety and irritability. No issues with constipation or diarrhea. Occasional aches and pains which are controlled with oxycodone. He does not require usage around the clock.   Overall Caleb Simmons feels his symptoms are well managed. No adjustments at this time. Will continue to support and follow.  Assessment and Plan  Chronic Cancer Related Pain Stable on current regimen. No constipation reported. -Continue Oxycodone 15mg  as needed.   Poor Appetite Fluctuating appetite with some improvement. Weight increased from 148 to 154 since last visit. -Attempt to refill Marinol 5mg  prescription.  Insomnia Persistent difficulty  sleeping. -Continue current regimen which includes Mirtazapine 30mg  at bedtime.   Anxiety Managed with Xanax. -Continue Xanax 0.25mg  as needed.  Dialysis Ongoing, twice weekly. -Continue current schedule.  Nausea Prescription for Compazine sent by Dr. Mosetta Putt.   Follow-up in approximately six weeks by phone, and in-person in three months.  Patient expressed understanding and was in agreement with this plan. He also understands that He can call the clinic at any time with any questions, concerns, or complaints.     Any controlled substances utilized were prescribed in the context of palliative care. PDMP has been reviewed.    Visit consisted of counseling and education dealing with the complex and emotionally intense issues of symptom management and palliative care in the setting of serious and potentially life-threatening illness.  Willette Alma, AGPCNP-BC  Palliative Medicine Team/[facility]

## 2024-04-12 ENCOUNTER — Telehealth (HOSPITAL_COMMUNITY): Payer: Self-pay | Admitting: Cardiology

## 2024-04-12 NOTE — Telephone Encounter (Signed)
 Called to confirm/remind patient of their appointment at [facility] on 04/21//2025.   Appointment:   [] Confirmed  [x] Left mess   [] No answer/No voice mail  [] VM Full/unable to leave message  [] Phone not in service  Patient reminded to bring all medications and/or complete list.  Confirmed patient has transportation. Gave directions, instructed to utilize valet parking.

## 2024-04-13 ENCOUNTER — Encounter (HOSPITAL_COMMUNITY): Payer: Self-pay | Admitting: Cardiology

## 2024-04-13 ENCOUNTER — Ambulatory Visit (HOSPITAL_COMMUNITY)
Admission: RE | Admit: 2024-04-13 | Discharge: 2024-04-13 | Disposition: A | Discharge status: Home / Self Care | Source: Ambulatory Visit | Attending: Cardiology | Admitting: Cardiology

## 2024-04-13 VITALS — BP 124/80 | HR 80 | Wt 151.4 lb

## 2024-04-13 DIAGNOSIS — Z716 Tobacco abuse counseling: Secondary | ICD-10-CM | POA: Diagnosis not present

## 2024-04-13 DIAGNOSIS — Z7982 Long term (current) use of aspirin: Secondary | ICD-10-CM | POA: Insufficient documentation

## 2024-04-13 DIAGNOSIS — Z79899 Other long term (current) drug therapy: Secondary | ICD-10-CM | POA: Insufficient documentation

## 2024-04-13 DIAGNOSIS — Z7969 Long term (current) use of other immunomodulators and immunosuppressants: Secondary | ICD-10-CM | POA: Diagnosis not present

## 2024-04-13 DIAGNOSIS — I5032 Chronic diastolic (congestive) heart failure: Secondary | ICD-10-CM | POA: Insufficient documentation

## 2024-04-13 DIAGNOSIS — N186 End stage renal disease: Secondary | ICD-10-CM | POA: Insufficient documentation

## 2024-04-13 DIAGNOSIS — E785 Hyperlipidemia, unspecified: Secondary | ICD-10-CM | POA: Diagnosis not present

## 2024-04-13 DIAGNOSIS — C9000 Multiple myeloma not having achieved remission: Secondary | ICD-10-CM | POA: Insufficient documentation

## 2024-04-13 DIAGNOSIS — F17210 Nicotine dependence, cigarettes, uncomplicated: Secondary | ICD-10-CM | POA: Insufficient documentation

## 2024-04-13 DIAGNOSIS — I3139 Other pericardial effusion (noninflammatory): Secondary | ICD-10-CM | POA: Insufficient documentation

## 2024-04-13 DIAGNOSIS — Z992 Dependence on renal dialysis: Secondary | ICD-10-CM | POA: Insufficient documentation

## 2024-04-13 DIAGNOSIS — E8581 Light chain (AL) amyloidosis: Secondary | ICD-10-CM | POA: Diagnosis present

## 2024-04-13 DIAGNOSIS — Z8673 Personal history of transient ischemic attack (TIA), and cerebral infarction without residual deficits: Secondary | ICD-10-CM | POA: Diagnosis not present

## 2024-04-13 DIAGNOSIS — Z9221 Personal history of antineoplastic chemotherapy: Secondary | ICD-10-CM | POA: Insufficient documentation

## 2024-04-13 LAB — LIPID PANEL
Cholesterol: 177 mg/dL (ref 0–200)
HDL: 28 mg/dL — ABNORMAL LOW (ref >40)
LDL Cholesterol: 108 mg/dL — ABNORMAL HIGH (ref 0–99)
Total CHOL/HDL Ratio: 6.3 ratio
Triglycerides: 206 mg/dL — ABNORMAL HIGH (ref <150)
VLDL: 41 mg/dL — ABNORMAL HIGH (ref 0–40)

## 2024-04-13 NOTE — Progress Notes (Addendum)
 PCP: Sonja [place], MD Oncology: Dr. Maryalice Smaller Cardiology: Dr. Mitzie Anda  Chief complaint: Cardiac amyloidosis  73 y.o. with minimal past history was admitted in 9/22 with AKI, group B strep bacteremia, and acute CHF.  He had to start on dialysis.  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  Echo showed EF 45-50%, moderate LVH, small pericardial effusion. Cardiac MRI showed EF 38%, moderate LVH, small pericardial effusion, elevated T1 suggestive of cardiac amyloidosis. He completed chemotherapy with Velcade  and Cytoxan  followed by monthly daratumumab .   Seen in clinic 11/22, compression hose and midodrine  started for orthostasis.  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 Evaluated by IR and underwent revascularization on 01/18/22 but re-occluded. IR recommended CTA head and neck in 8 weeks. Echo with EF 55-60%, moderate LVH, grade I DD, normal RV. Discharged with outpatient neuro rehab.  MSSA bacteremia in 11/24.  TEE showed EF 55-60%, mild LVH, normal RV, no endocarditis.   Today he returns for followup of AL amyloidosis.  Smokes 1/2 ppd, does not want to quit. He has completed chemotherapy for AL amyloidosis and is being followed with surveillance by oncology.  He gets tired after HD.  No significant exertional dyspnea.  He plays golf and does yardwork like mowing the grass.  No longer on midodrine .   ECG (personally reviewed): Sinus tachy with PACs  Labs (10/22): hgb 8.9 Labs (1/23): hgb 7.8  PMH: 1. Nephrolithiasis 2. 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  - Treated with Cytoxan  and Velcade  followed by daratumumab .  3. Anemia 4. 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  - Cardiac MRI (9/22): EF 38%, moderate LVH, small pericardial effusion, elevated T1 suggestive of cardiac amyloidosis.   - Echo 1/23 EF 55-60%,  mild LVH, Grade I DD, normal RV.  - TEE (11/24): EF 55-60%, mild LVH, normal RV 6. Orthostatic hypotension.  7. 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  8. Palpitations: Zio monitor in 2/23 with 2 runs NSVT, multiple runs SVT, no atrial fibrillation.   Social History   Socioeconomic History   Marital status: Married    Spouse name: Not on file   Number of children: Not on file   Years of education: Not on file   Highest education level: Not on file  Occupational History   Not on file  Tobacco Use   Smoking status: Some Days    Current packs/day: 0.25    Average packs/day: 0.3 packs/day for 25.0 years (6.3 ttl pk-yrs)    Types: Cigarettes   Smokeless tobacco: Never  Substance and Sexual Activity   Alcohol use: Not on file   Drug use: Not on file   Sexual activity: Not on file  Other Topics Concern   Not on file  Social History Narrative   Not on file   Social Drivers of Health   Financial Resource Strain: Low Risk  (09/11/2021)   Overall Financial Resource Strain (CARDIA)    Difficulty of Paying Living Expenses: Not very hard  Food Insecurity: No Food Insecurity (10/28/2023)   Hunger Vital Sign    Worried About Running Out of Food in the Last Year: Never true    Ran Out of Food in the Last Year: Never true  Transportation Needs: No Transportation Needs (10/28/2023)   PRAPARE - Administrator, Civil Service (Medical):  No    Lack of Transportation (Non-Medical): No  Physical Activity: Not on file  Stress: Not on file  Social Connections: Not on file  Intimate Partner Violence: Not At Risk (10/28/2023)   Humiliation, Afraid, Rape, and Kick questionnaire    Fear of Current or Ex-Partner: No    Emotionally Abused: No    Physically Abused: No    Sexually Abused: No   History reviewed. No pertinent family history.  ROS: All systems reviewed and negative except as per HPI.   Current Outpatient Medications  Medication Sig Dispense  Refill   acetaminophen  (TYLENOL ) 500 MG tablet Take 1,000 mg by mouth every 6 (six) hours as needed for moderate pain.     ALPRAZolam  (XANAX ) 1 MG tablet Take 1 tablet (1 mg total) by mouth 3 (three) times daily as needed for anxiety or sleep. 90 tablet 0   aspirin  81 MG EC tablet Take 1 tablet (81 mg total) by mouth daily. Swallow whole. 30 tablet 11   dronabinol  (MARINOL ) 2.5 MG capsule Take 1 capsule (2.5 mg total) by mouth 2 (two) times daily before a meal. 60 capsule 2   fluticasone (FLONASE) 50 MCG/ACT nasal spray Place 1 spray into both nostrils daily.     folic acid  (FOLVITE ) 1 MG tablet TAKE 2 TABLETS BY MOUTH ONCE DAILY 60 tablet 0   metoprolol  succinate (TOPROL -XL) 25 MG 24 hr tablet TAKE 1/2 TABLET BY MOUTH ONCE DAILY 30 tablet 0   mirtazapine  (REMERON ) 30 MG tablet TAKE 1 TABLET BY MOUTH AT BEDTIME 30 tablet 1   Nutritional Supplements (FEEDING SUPPLEMENT, NEPRO CARB STEADY,) LIQD Take 237 mLs by mouth 2 (two) times daily between meals.     oxyCODONE  (ROXICODONE ) 15 MG immediate release tablet Take 1 tablet (15 mg total) by mouth every 4 (four) hours as needed. 90 tablet 0   pantoprazole  (PROTONIX ) 40 MG tablet TAKE 1 TABLET BY MOUTH 2 TIMES DAILY BEFORE MEALS 60 tablet 0   prochlorperazine  (COMPAZINE ) 5 MG tablet TAKE 1 OR 2 TABLETS BY MOUTH EVERY 8 HOURS AS NEEDED FOR NAUSEA AND VOMITING 30 tablet 1   tamsulosin  (FLOMAX ) 0.4 MG CAPS capsule TAKE 1 CAPSULE BY MOUTH EVERY DAY 30 capsule 0   zolpidem  (AMBIEN ) 5 MG tablet Take 1 tablet (5 mg total) by mouth at bedtime as needed. for sleep 30 tablet 3   No current facility-administered medications for this encounter.   Facility-Administered Medications Ordered in Other Encounters  Medication Dose Route Frequency Provider Last Rate Last Admin   0.9 %  sodium chloride  infusion (Manually program via Guardrails IV Fluids)  250 mL Intravenous Once Sonja [place], MD       heparin  lock flush 100 unit/mL  500 Units Intracatheter Once Sonja Cruger, MD        sodium chloride  flush (NS) 0.9 % injection 10 mL  10 mL Intracatheter Once Sonja , MD       Wt Readings from Last 3 Encounters:  04/13/24 68.7 kg (151 lb 6.4 oz)  02/25/24 69.9 kg (154 lb)  01/28/24 67.4 kg (148 lb 9.6 oz)   BP 124/80   Pulse 80   Wt 68.7 kg (151 lb 6.4 oz)   SpO2 97%   BMI 21.72 kg/m  General: NAD Neck: No JVD, no thyromegaly or thyroid nodule.  Lungs: Clear to auscultation bilaterally with normal respiratory effort. CV: Nondisplaced PMI.  Heart regular S1/S2, no S3/S4, no murmur.  No peripheral edema.  No carotid bruit.  Normal pedal  pulses.  Abdomen: Soft, nontender, no hepatosplenomegaly, no distention.  Skin: Intact without lesions or rashes.  Neurologic: Alert and oriented x 3.  Psych: Normal affect. Extremities: No clubbing or cyanosis.  HEENT: Normal.   Assessment/Plan: 1. 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  He has biopsy-proven AL amyloidosis from renal and bone marrow biopsies.  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  EF was low (38%) on cardiac MRI, but GDMT was limited by ESRD and orthostatic hypotension.  Echo 1/23 EF 55-60%, mild LVH, normal RV.  TEE in 11/24 with EF 55-60%, mild LVH, normal RV.  He is not volume overloaded on exam. He has finished chemotherapy with Velcade  and Cytoxan , followed by daratumumab .  - Surveillance ongoing by oncology.   2. 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 This has resolved and he is off midodrine .   3. ESRD: Suspect due to amyloidosis.   4. CVA: 1/23 left MCA, left ICA occlusion/high grade stenosis. Neuro IR attempted re-vascularization.  - Continue ASA 81 daily. - Myalgias with atorvastatin  and stopped it.  Check lipids today, refer for Repatha if LDL is elevated.  5. Tobacco Use: Discussed cessation, not ready. 6. Palpitations: Zio monitor in  2/23 showed 2 NSVT runs and multiple SVT runs.  - Continue Toprol  XL 12.5 gm daily.   Followup 1 year  I spent 22 minutes reviewing records, interviewing/examining patient, and managing orders.   Peder Bourdon. 04/13/2024

## 2024-04-13 NOTE — Patient Instructions (Signed)
 There has been no changes to your medications.  Labs done today, your results will be available in MyChart, we will contact you for abnormal readings.  Your physician recommends that you schedule a follow-up appointment in: 1 year ( April 2026) ** PLEASE CALL THE OFFICE IN FEBRUARY 2026 TO ARRANGE YOUR FOLLOW UP APPOINTMENT.**  If you have any questions or concerns before your next appointment please send us  a message through [place] or call our office at [phone].    TO LEAVE A MESSAGE FOR THE NURSE SELECT OPTION 2, PLEASE LEAVE A MESSAGE INCLUDING: YOUR NAME DATE OF BIRTH CALL BACK NUMBER REASON FOR CALL**this is important as we prioritize the call backs  YOU WILL RECEIVE A CALL BACK THE SAME DAY AS LONG AS YOU CALL BEFORE 4:00 PM  At the [facility], you and your health needs are our priority. As part of our continuing mission to provide you with exceptional heart care, we have created designated Provider Care Teams. These Care Teams include your primary Cardiologist (physician) and Advanced Practice Providers (APPs- Physician Assistants and Nurse Practitioners) who all work together to provide you with the care you need, when you need it.   You may see any of the following providers on your designated Care Team at your next follow up: Dr Jules Oar Dr Peder Bourdon Dr. Alwin Baars Dr. Arta Lark Amy Marijane Shoulders, NP Ruddy Corral, [place] [facility] [place], [place] Dennise Fitz, NP [place] Lee, NP Shawnee Dellen, NP Luster Salters, PharmD Bevely Brush, PharmD   Please be sure to bring in all your medications bottles to every appointment.    Thank you for choosing [facility]

## 2024-04-26 ENCOUNTER — Other Ambulatory Visit: Payer: Self-pay | Admitting: Hematology

## 2024-04-26 DIAGNOSIS — E8581 Light chain (AL) amyloidosis: Secondary | ICD-10-CM

## 2024-04-26 DIAGNOSIS — F419 Anxiety disorder, unspecified: Secondary | ICD-10-CM

## 2024-04-26 DIAGNOSIS — R110 Nausea: Secondary | ICD-10-CM

## 2024-04-26 DIAGNOSIS — Z515 Encounter for palliative care: Secondary | ICD-10-CM

## 2024-04-26 DIAGNOSIS — G893 Neoplasm related pain (acute) (chronic): Secondary | ICD-10-CM

## 2024-04-26 DIAGNOSIS — E854 Organ-limited amyloidosis: Secondary | ICD-10-CM

## 2024-04-26 MED ORDER — OXYCODONE HCL 15 MG PO TABS: 15.0000 mg | ORAL_TABLET | ORAL | 0 refills | Status: DC | PRN

## 2024-04-26 MED ORDER — FLUTICASONE PROPIONATE 50 MCG/ACT NA SUSP: 1.0000 | Freq: Every day | NASAL | 5 refills | Status: AC

## 2024-04-26 MED ORDER — ALPRAZOLAM 1 MG PO TABS: 1.0000 mg | ORAL_TABLET | Freq: Three times a day (TID) | ORAL | 0 refills | Status: DC | PRN

## 2024-05-24 ENCOUNTER — Other Ambulatory Visit (HOSPITAL_COMMUNITY): Payer: Self-pay | Admitting: Cardiology

## 2024-05-24 ENCOUNTER — Other Ambulatory Visit: Payer: Self-pay | Admitting: Nurse Practitioner

## 2024-05-24 ENCOUNTER — Other Ambulatory Visit: Payer: Self-pay | Admitting: Hematology

## 2024-05-24 DIAGNOSIS — Z515 Encounter for palliative care: Secondary | ICD-10-CM

## 2024-05-24 DIAGNOSIS — G4700 Insomnia, unspecified: Secondary | ICD-10-CM

## 2024-05-24 DIAGNOSIS — I43 Cardiomyopathy in diseases classified elsewhere: Secondary | ICD-10-CM

## 2024-05-24 DIAGNOSIS — E8581 Light chain (AL) amyloidosis: Secondary | ICD-10-CM

## 2024-05-26 ENCOUNTER — Other Ambulatory Visit: Payer: Self-pay

## 2024-05-26 DIAGNOSIS — D638 Anemia in other chronic diseases classified elsewhere: Secondary | ICD-10-CM

## 2024-05-26 DIAGNOSIS — E8581 Light chain (AL) amyloidosis: Secondary | ICD-10-CM

## 2024-05-27 ENCOUNTER — Encounter: Payer: Self-pay | Admitting: Nurse Practitioner

## 2024-05-27 ENCOUNTER — Inpatient Hospital Stay (HOSPITAL_BASED_OUTPATIENT_CLINIC_OR_DEPARTMENT_OTHER): Admitting: Nurse Practitioner

## 2024-05-27 ENCOUNTER — Encounter: Payer: Self-pay | Admitting: Hematology

## 2024-05-27 ENCOUNTER — Inpatient Hospital Stay

## 2024-05-27 ENCOUNTER — Inpatient Hospital Stay: Attending: Hematology | Admitting: Hematology

## 2024-05-27 VITALS — BP 138/80 | HR 60 | Temp 98.3°F | Resp 17 | Wt 159.3 lb

## 2024-05-27 DIAGNOSIS — G893 Neoplasm related pain (acute) (chronic): Secondary | ICD-10-CM | POA: Diagnosis not present

## 2024-05-27 DIAGNOSIS — Z515 Encounter for palliative care: Secondary | ICD-10-CM

## 2024-05-27 DIAGNOSIS — D638 Anemia in other chronic diseases classified elsewhere: Secondary | ICD-10-CM

## 2024-05-27 DIAGNOSIS — R110 Nausea: Secondary | ICD-10-CM | POA: Diagnosis not present

## 2024-05-27 DIAGNOSIS — E854 Organ-limited amyloidosis: Secondary | ICD-10-CM

## 2024-05-27 DIAGNOSIS — I43 Cardiomyopathy in diseases classified elsewhere: Secondary | ICD-10-CM

## 2024-05-27 DIAGNOSIS — F419 Anxiety disorder, unspecified: Secondary | ICD-10-CM

## 2024-05-27 DIAGNOSIS — E8581 Light chain (AL) amyloidosis: Secondary | ICD-10-CM | POA: Diagnosis present

## 2024-05-27 DIAGNOSIS — M2550 Pain in unspecified joint: Secondary | ICD-10-CM | POA: Diagnosis not present

## 2024-05-27 DIAGNOSIS — N186 End stage renal disease: Secondary | ICD-10-CM

## 2024-05-27 DIAGNOSIS — Z992 Dependence on renal dialysis: Secondary | ICD-10-CM | POA: Diagnosis not present

## 2024-05-27 LAB — CBC WITH DIFFERENTIAL (CANCER CENTER ONLY)
Abs Immature Granulocytes: 0.03 10*3/uL (ref 0.00–0.07)
Basophils Absolute: 0.1 10*3/uL (ref 0.0–0.1)
Basophils Relative: 1 %
Eosinophils Absolute: 0.3 10*3/uL (ref 0.0–0.5)
Eosinophils Relative: 3 %
HCT: 27.9 % — ABNORMAL LOW (ref 39.0–52.0)
Hemoglobin: 9.4 g/dL — ABNORMAL LOW (ref 13.0–17.0)
Immature Granulocytes: 0 %
Lymphocytes Relative: 25 %
Lymphs Abs: 2 10*3/uL (ref 0.7–4.0)
MCH: 29.7 pg (ref 26.0–34.0)
MCHC: 33.7 g/dL (ref 30.0–36.0)
MCV: 88 fL (ref 80.0–100.0)
Monocytes Absolute: 0.5 10*3/uL (ref 0.1–1.0)
Monocytes Relative: 7 %
Neutro Abs: 5 10*3/uL (ref 1.7–7.7)
Neutrophils Relative %: 64 %
Platelet Count: 165 10*3/uL (ref 150–400)
RBC: 3.17 MIL/uL — ABNORMAL LOW (ref 4.22–5.81)
RDW: 14.6 % (ref 11.5–15.5)
WBC Count: 7.8 10*3/uL (ref 4.0–10.5)
nRBC: 0 % (ref 0.0–0.2)

## 2024-05-27 LAB — CMP (CANCER CENTER ONLY)
ALT: 5 U/L (ref 0–44)
AST: 8 U/L — ABNORMAL LOW (ref 15–41)
Albumin: 3.9 g/dL (ref 3.5–5.0)
Alkaline Phosphatase: 71 U/L (ref 38–126)
Anion gap: 12 (ref 5–15)
BUN: 61 mg/dL — ABNORMAL HIGH (ref 8–23)
CO2: 22 mmol/L (ref 22–32)
Calcium: 7.9 mg/dL — ABNORMAL LOW (ref 8.9–10.3)
Chloride: 102 mmol/L (ref 98–111)
Creatinine: 7.45 mg/dL (ref 0.61–1.24)
GFR, Estimated: 7 mL/min — ABNORMAL LOW (ref >60)
Glucose, Bld: 90 mg/dL (ref 70–99)
Potassium: 4.5 mmol/L (ref 3.5–5.1)
Sodium: 136 mmol/L (ref 135–145)
Total Bilirubin: 0.3 mg/dL (ref 0.0–1.2)
Total Protein: 6.4 g/dL — ABNORMAL LOW (ref 6.5–8.1)

## 2024-05-27 LAB — FERRITIN: Ferritin: 471 ng/mL — ABNORMAL HIGH (ref 24–336)

## 2024-05-27 MED ORDER — OXYCODONE HCL 15 MG PO TABS: 15.0000 mg | ORAL_TABLET | ORAL | 0 refills | Status: DC | PRN

## 2024-05-27 MED ORDER — ALPRAZOLAM 1 MG PO TABS
1.0000 mg | ORAL_TABLET | Freq: Three times a day (TID) | ORAL | 0 refills | Status: DC | PRN
Start: 2024-05-27 — End: 2024-06-22

## 2024-05-27 NOTE — Progress Notes (Signed)
 Palliative Medicine [facility]  [phone] [phone]   Name: Caleb Simmons Date: 05/27/2024 MRN: [id number]  DOB: Aug 19, 1951  Patient Care Team: Sonja Felton, MD as PCP - General (Hematology) Buntin, Albesa Alter, RN (Inactive) as Registered Nurse Pickenpack-Cousar, Giles Labrum, NP as Nurse Practitioner (Nurse Practitioner) Sonja [place], MD as Attending Physician (Hematology and Oncology)    INTERVAL HISTORY: Caleb Simmons is a 73 y.o. 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  Recently admitted and discharged on 01/20/22 after receiving treatment for left MCA CVA. Palliative following for ongoing goals of care discussions and symptom management.   SOCIAL HISTORY:     reports that he has been smoking cigarettes. He has a 6.3 pack-year smoking history. He has never used smokeless tobacco.  ADVANCE DIRECTIVES:  Patient reports completed document.  Caleb Simmons, Caleb Simmons. is his healthcare power of attorney.  CODE STATUS: Full code  PAST MEDICAL HISTORY: Past Medical History:  Diagnosis Date   Cancer (HCC)     ALLERGIES:  is allergic to iron sucrose.  MEDICATIONS:  Current Outpatient Medications  Medication Sig Dispense Refill   acetaminophen  (TYLENOL ) 500 MG tablet Take 1,000 mg by mouth every 6 (six) hours as needed for moderate pain.     ALPRAZolam  (XANAX ) 1 MG tablet Take 1 tablet (1 mg total) by mouth 3 (three) times daily as needed for anxiety or sleep. 90 tablet 0   aspirin  81 MG EC tablet Take 1 tablet (81 mg total) by mouth daily. Swallow whole. 30 tablet 11   dronabinol  (MARINOL ) 2.5 MG capsule Take 1 capsule (2.5 mg total) by mouth 2 (two) times daily before a meal. 60 capsule 2   fluticasone  (FLONASE ) 50 MCG/ACT nasal spray Place 1 spray into both nostrils daily. 16 g 5   folic acid  (FOLVITE ) 1 MG tablet TAKE 2  TABLETS BY MOUTH EVERY DAY 60 tablet 0   metoprolol  succinate (TOPROL -XL) 25 MG 24 hr tablet TAKE 1/2 TABLET BY MOUTH EVERY DAY 45 tablet 3   mirtazapine  (REMERON ) 30 MG tablet TAKE 1 TABLET BY MOUTH AT BEDTIME 30 tablet 1   Nutritional Supplements (FEEDING SUPPLEMENT, NEPRO CARB STEADY,) LIQD Take 237 mLs by mouth 2 (two) times daily between meals.     oxyCODONE  (ROXICODONE ) 15 MG immediate release tablet Take 1 tablet (15 mg total) by mouth every 4 (four) hours as needed. 90 tablet 0   pantoprazole  (PROTONIX ) 40 MG tablet TAKE 1 TABLET BY MOUTH 2 TIMES DAILY BEFORE MEALS 60 tablet 0   prochlorperazine  (COMPAZINE ) 5 MG tablet TAKE 1 OR 2 TABLETS BY MOUTH EVERY 8 HOURS AS NEEDED FOR NAUSEA AND VOMITING 30 tablet 1   tamsulosin  (FLOMAX ) 0.4 MG CAPS capsule TAKE 1 CAPSULE BY MOUTH EVERY DAY 30 capsule 0   zolpidem  (AMBIEN ) 5 MG tablet TAKE 1 TABLET BY MOUTH AT BEDTIME AS NEEDED FOR SLEEP 30 tablet 3   No current facility-administered medications for this visit.   Facility-Administered Medications Ordered in Other Visits  Medication Dose Route Frequency Provider Last Rate Last Admin   0.9 %  sodium chloride  infusion (Manually program via Guardrails IV Fluids)  250 mL Intravenous Once Sonja Groesbeck, MD       heparin  lock flush 100 unit/mL  500 Units Intracatheter Once Sonja [place], MD       sodium chloride  flush (NS) 0.9 % injection 10  mL  10 mL Intracatheter Once Sonja Chicot, MD        VITAL SIGNS: There were no vitals taken for this visit. There were no vitals filed for this visit.    Estimated body mass index is 22.86 kg/m as calculated from the following:   Height as of 02/25/24: 5\' 10"  (1.778 m).   Weight as of an earlier encounter on 05/27/24: 159 lb 4.8 oz (72.3 kg).   PERFORMANCE STATUS (ECOG) : 2 - Symptomatic, <50% confined to bed  Discussed the use of AI scribe software for clinical note transcription with the patient, who gave verbal consent to proceed.   IMPRESSION:  Caleb Simmons  presents to clinic for symptom management follow-up.  His Significant Other, Caleb Simmons is present. Patient is doing well overall. Tolerating dialysis twice weekly. Reports occasional fatigue. Appetite is improving. Some recent nausea however this is controlled with medications. Current weight is 159lbs up from 154lbs.   Patient shares he is looking forward to upcoming beach trip in July. He enjoys getting on the boat and fishing.   Denies concerns for nausea, vomiting, constipation, or diarrhea. He is sleeping well at night and anxiety well controlled with as needed use of Xanax . Pain managed on current regimen of oxycodone . Does not require around the clock. Refills appropriate with timing and use. No adjustments to regimen.   Overall Mr. Adkison feels his symptoms are well managed. No adjustments at this time. Will continue to support and follow.  Assessment and Plan  Chronic Cancer Related Pain Stable on current regimen. No constipation reported. -Continue Oxycodone  15mg  as needed.   Poor Appetite Fluctuating appetite with some improvement. Weight increased from 154 to 159 since last visit. -Attempt to refill Marinol  5mg  prescription. Remains on backorder.  -Continue Mirtazapine  at bedtime.   Insomnia Persistent difficulty sleeping. -Continue current regimen which includes Mirtazapine  30mg  at bedtime.   Anxiety Managed with Xanax . -Continue Xanax  0.25mg  as needed.  Dialysis Ongoing, twice weekly. -Continue current schedule.  Nausea Prescription for Compazine  sent by Dr. Maryalice Smaller.   Follow-up in approximately six weeks by phone, and in-person in three months.  Patient expressed understanding and was in agreement with this plan. He also understands that He can call the clinic at any time with any questions, concerns, or complaints.     Any controlled substances utilized were prescribed in the context of palliative care. PDMP has been reviewed.    Visit consisted of counseling and  education dealing with the complex and emotionally intense issues of symptom management and palliative care in the setting of serious and potentially life-threatening illness.  Dellia Ferguson, AGPCNP-BC  Palliative Medicine Team/[facility]

## 2024-05-27 NOTE — Assessment & Plan Note (Signed)
-  lamda light chain disease with renal, cardiac and neuro involvement   -diagnosed in 08/2021 -not a candidate for transplant  --He began weekly oral Cytoxan, dexa and Velcade injection while inpatient on 09/10/21.  He tolerated well. -He began daratumumab injection 09/27/21. Chemo was held briefly following stroke 01/14/22. -not a candidate for biphosphonate due to renal failure -he completed 6 cycle induction chemo with CyBorD on 03/21/22 and moved to monthly Dara maintenance on 03/28/22. He completed a total of 2 year therapy in 08/2023 -He has had very good partial response to treatment based on his M protein and light chain level. -we previously discussed options of treatment for relapsed disease  -continue surveillance.

## 2024-05-27 NOTE — Assessment & Plan Note (Signed)
-  on HD MF  

## 2024-05-27 NOTE — Assessment & Plan Note (Signed)
-   fu with cardiology

## 2024-05-27 NOTE — Progress Notes (Signed)
 CRITICAL VALUE STICKER  CRITICAL VALUE: Creatinine 7.45  RECEIVER (on-site recipient of call): Odilia Bennett   DATE & TIME NOTIFIED: 05/27/24 @ 0855  MESSENGER (representative from lab): Amber  MD NOTIFIED: Dr. Maryalice Smaller   TIME OF NOTIFICATION: 0865  RESPONSE: patient had appt with Dr. Maryalice Smaller today

## 2024-05-27 NOTE — Progress Notes (Signed)
 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  He tolerated well. -He began daratumumab  injection 09/27/21. Chemo was held briefly following stroke 01/14/22. -not a candidate for biphosphonate due to renal failure -he completed 6 cycle induction chemo with CyBorD on 03/21/22 and moved to monthly Dara maintenance on 03/28/22. He completed a total of 2 year therapy in 08/2023 -He has had very good partial response to treatment based on his M protein and light chain level. -we previously discussed options of treatment for relapsed disease  -continue surveillance.    Assessment & Plan AL Amyloidosis AL amyloidosis with cardiac and renal involvement. Currently well-managed with no significant changes in cardiac mass or M protein levels. Hemoglobin at 9.4, no recent transfusions required. Potential for future treatment if condition worsens. Chemotherapy with daratumumab  discussed as a targeted therapy that suppresses the immune system, posing risks such as infection and cytopenias, but not typically causing gastrointestinal discomfort. - Monitor every 2-3 months. - Schedule follow-up in 3 months.  Chronic Kidney Disease on  Dialysis Chronic kidney disease requiring biweekly dialysis. No issues reported with sessions. Renal function remains impaired. Dialysis likely contributing to overall health status, including nausea.  Nausea Nausea managed effectively with medication. No specific etiology identified, possibly related to overall health status and dialysis. - Continue current antiemetic regimen.  Chronic Pain Chronic pain involving joints and generalized body pain. Managed with oxycodone  and other medications. Pain management overseen by Landa Pine. - Continue current analgesic regimen. - Coordinate with [facility] for pain management.  PLAN - Patient is clinically stable, lab reviewed - Continue surveillance. - Lab and follow-up in 3 months    Discussed the use of AI scribe software for clinical note transcription with the patient, who gave verbal consent to proceed.  History of Present Illness Caleb Simmons is a 73 year old male with AL amyloidosis who presents for follow-up. He is accompanied by Ozie Bo, his partner.  He undergoes dialysis twice a week without issues and maintains a decent appetite. Nausea is present but relieved with medication. Energy levels allow for activities like mowing the yard and house chores, though he and his partner limit outings due to not feeling well.  He takes multiple medications, including folic acid , metoprolol , pantoprazole , Flomax , AMBN, Xanax , and oxycodone  for pain, which is described as 'all over' and includes joint pain. Appetite-boosting medications are part of his regimen, though one was missed in a recent prescription pack. He has not been using much of the twice-a-day medication stored in the refrigerator.  No blood transfusions have been required since coming off treatment. His last multiple myeloma panel in March showed no significant changes. He has a kidney port for dialysis but no chemo port. No back or stomach pain, swelling, or leg  swelling is reported. Cardiac  mass remains unchanged and blood pressure is stable.     All other systems were reviewed with the patient and are negative.  MEDICAL HISTORY:  Past Medical History:  Diagnosis Date   Cancer [facility])     SURGICAL HISTORY: Past Surgical History:  Procedure Laterality Date   APPENDECTOMY     IR ANGIO INTRA EXTRACRAN SEL COM CAROTID INNOMINATE BILAT MOD SED  01/16/2022   IR ANGIO INTRA EXTRACRAN SEL COM CAROTID INNOMINATE BILAT MOD SED  07/16/2022   IR ANGIO VERTEBRAL SEL SUBCLAVIAN INNOMINATE BILAT MOD SED  01/16/2022   IR ANGIO VERTEBRAL SEL SUBCLAVIAN INNOMINATE UNI R MOD SED  07/16/2022   IR ANGIO VERTEBRAL SEL VERTEBRAL UNI L MOD SED  07/16/2022   IR CT HEAD LTD  01/18/2022   IR EMBO ART  VEN HEMORR LYMPH EXTRAV  INC GUIDE ROADMAPPING  08/30/2021   IR FLUORO GUIDE CV LINE RIGHT  08/30/2021   IR FLUORO GUIDE CV LINE RIGHT  09/18/2021   IR FLUORO GUIDE CV LINE RIGHT  05/02/2022   IR FLUORO GUIDE CV LINE RIGHT  10/31/2023   IR PERCUTANEOUS ART THROMBECTOMY/INFUSION INTRACRANIAL INC DIAG ANGIO  01/18/2022   IR RADIOLOGIST EVAL & MGMT  08/02/2022   IR REMOVAL TUN CV CATH W/O FL  10/28/2023   IR RENAL SELECTIVE  UNI INC S&I MOD SED  08/31/2021   IR US  GUIDE VASC ACCESS RIGHT  08/30/2021   IR US  GUIDE VASC ACCESS RIGHT  08/30/2021   IR US  GUIDE VASC ACCESS RIGHT  09/18/2021   IR US  GUIDE VASC ACCESS RIGHT  01/16/2022   IR US  GUIDE VASC ACCESS RIGHT  07/16/2022   IR US  GUIDE VASC ACCESS RIGHT  10/31/2023   IR VENOCAVAGRAM SVC  05/02/2022   RADIOLOGY WITH ANESTHESIA N/A 01/18/2022   Procedure: Cerebral angioplasty with possible stenting;  Surgeon: Luellen Sages, MD;  Location: [facility] OR;  Service: Radiology;  Laterality: N/A;   TRANSESOPHAGEAL ECHOCARDIOGRAM (CATH LAB) N/A 10/30/2023   Procedure: TRANSESOPHAGEAL ECHOCARDIOGRAM;  Surgeon: Olinda Bertrand, DO;  Location: [facility] INVASIVE CV LAB;  Service: Cardiovascular;  Laterality: N/A;    I have reviewed the social history and family history with the patient and they  are unchanged from previous note.  ALLERGIES:  is allergic to iron sucrose.  MEDICATIONS:  Current Outpatient Medications  Medication Sig Dispense Refill   acetaminophen  (TYLENOL ) 500 MG tablet Take 1,000 mg by mouth every 6 (six) hours as needed for moderate pain.     aspirin  81 MG EC tablet Take 1 tablet (81 mg total) by mouth daily. Swallow whole. 30 tablet 11   dronabinol  (MARINOL ) 2.5 MG capsule Take 1 capsule (2.5 mg total) by mouth 2 (two) times daily before a meal. 60 capsule 2   fluticasone  (FLONASE ) 50 MCG/ACT nasal spray Place 1 spray into both nostrils daily. 16 g 5   folic acid  (FOLVITE ) 1 MG tablet TAKE 2 TABLETS BY MOUTH EVERY DAY 60 tablet 0   metoprolol  succinate (TOPROL -XL) 25 MG 24 hr tablet TAKE 1/2 TABLET BY MOUTH EVERY DAY 45 tablet 3   mirtazapine  (REMERON ) 30 MG tablet TAKE 1 TABLET BY MOUTH AT BEDTIME 30 tablet 1   Nutritional Supplements (FEEDING SUPPLEMENT, NEPRO CARB STEADY,) LIQD Take 237 mLs by mouth 2 (two) times daily between meals.     pantoprazole  (PROTONIX ) 40 MG tablet TAKE 1 TABLET BY MOUTH 2 TIMES DAILY BEFORE MEALS 60 tablet 0   prochlorperazine  (COMPAZINE ) 5 MG tablet  TAKE 1 OR 2 TABLETS BY MOUTH EVERY 8 HOURS AS NEEDED FOR NAUSEA AND VOMITING 30 tablet 1   tamsulosin  (FLOMAX ) 0.4 MG CAPS capsule TAKE 1 CAPSULE BY MOUTH EVERY DAY 30 capsule 0   zolpidem  (AMBIEN ) 5 MG tablet TAKE 1 TABLET BY MOUTH AT BEDTIME AS NEEDED FOR SLEEP 30 tablet 3   ALPRAZolam  (XANAX ) 1 MG tablet Take 1 tablet (1 mg total) by mouth 3 (three) times daily as needed for anxiety or sleep. 90 tablet 0   oxyCODONE  (ROXICODONE ) 15 MG immediate release tablet Take 1 tablet (15 mg total) by mouth every 4 (four) hours as needed. 90 tablet 0   No current facility-administered medications for this visit.   Facility-Administered Medications Ordered in Other Visits  Medication Dose Route Frequency Provider Last Rate Last Admin   0.9 %  sodium chloride  infusion (Manually program via  Guardrails IV Fluids)  250 mL Intravenous Once Sonja [place], MD       heparin  lock flush 100 unit/mL  500 Units Intracatheter Once Sonja [place], MD       sodium chloride  flush (NS) 0.9 % injection 10 mL  10 mL Intracatheter Once Sonja [place], MD        PHYSICAL EXAMINATION: ECOG PERFORMANCE STATUS: 2 - Symptomatic, <50% confined to bed  Vitals:   05/27/24 0818  BP: 138/80  Pulse: 60  Resp: 17  Temp: 98.3 F (36.8 C)  SpO2: 99%   Wt Readings from Last 3 Encounters:  05/27/24 159 lb 4.8 oz (72.3 kg)  04/13/24 151 lb 6.4 oz (68.7 kg)  02/25/24 154 lb (69.9 kg)     GENERAL:alert, no distress and comfortable SKIN: skin color, texture, turgor are normal, no rashes or significant lesions EYES: normal, Conjunctiva are pink and non-injected, sclera clear NECK: supple, thyroid normal size, non-tender, without nodularity LYMPH:  no palpable lymphadenopathy in the cervical, axillary  LUNGS: clear to auscultation and percussion with normal breathing effort HEART: regular rate & rhythm and no murmurs and no lower extremity edema ABDOMEN:abdomen soft, non-tender and normal bowel sounds Musculoskeletal:no cyanosis of digits and no clubbing  NEURO: alert & oriented x 3 with fluent speech, no focal motor/sensory deficits  Physical Exam   LABORATORY DATA:  I have reviewed the data as listed    Latest Ref Rng & Units 05/27/2024    7:28 AM 02/25/2024    8:14 AM 12/11/2023    8:14 AM  CBC  WBC 4.0 - 10.5 K/uL 7.8  8.7  7.8   Hemoglobin 13.0 - 17.0 g/dL 9.4  9.6  9.5   Hematocrit 39.0 - 52.0 % 27.9  30.0  29.1   Platelets 150 - 400 K/uL 165  216  163         Latest Ref Rng & Units 05/27/2024    7:28 AM 02/25/2024    8:14 AM 12/11/2023    8:14 AM  CMP  Glucose 70 - 99 mg/dL 90  96  88   BUN 8 - 23 mg/dL 61  44  54   Creatinine 0.61 - 1.24 mg/dL 4.09  8.11  9.14   Sodium 135 - 145 mmol/L 136  136  137   Potassium 3.5 - 5.1 mmol/L 4.5  4.4  4.7   Chloride 98 - 111 mmol/L 102  99  104   CO2 22 -  32 mmol/L 22  25  23    Calcium  8.9 - 10.3 mg/dL 7.9  8.6  8.4   Total Protein 6.5 -  8.1 g/dL 6.4  6.5  6.0   Total Bilirubin 0.0 - 1.2 mg/dL 0.3  0.4  0.5   Alkaline Phos 38 - 126 U/L 71  87  90   AST 15 - 41 U/L 8  7  7    ALT 0 - 44 U/L 5  5  <5       RADIOGRAPHIC STUDIES: I have personally reviewed the radiological images as listed and agreed with the findings in the report. No results found.    No orders of the defined types were placed in this encounter.  All questions were answered. The patient knows to call the clinic with any problems, questions or concerns. No barriers to learning was detected. The total time spent in the appointment was 25 minutes, including review of chart and various tests results, discussions about plan of care and coordination of care plan     Sonja [place], MD 05/27/2024

## 2024-05-28 LAB — KAPPA/LAMBDA LIGHT CHAINS
Kappa free light chain: 59.4 mg/L — ABNORMAL HIGH (ref 3.3–19.4)
Kappa, lambda light chain ratio: 2.22 — ABNORMAL HIGH (ref 0.26–1.65)
Lambda free light chains: 26.7 mg/L — ABNORMAL HIGH (ref 5.7–26.3)

## 2024-05-31 ENCOUNTER — Telehealth: Payer: Self-pay

## 2024-05-31 LAB — MULTIPLE MYELOMA PANEL, SERUM
Albumin SerPl Elph-Mcnc: 3.6 g/dL (ref 2.9–4.4)
Albumin/Glob SerPl: 1.6 (ref 0.7–1.7)
Alpha 1: 0.3 g/dL (ref 0.0–0.4)
Alpha2 Glob SerPl Elph-Mcnc: 0.9 g/dL (ref 0.4–1.0)
B-Globulin SerPl Elph-Mcnc: 0.8 g/dL (ref 0.7–1.3)
Gamma Glob SerPl Elph-Mcnc: 0.4 g/dL (ref 0.4–1.8)
Globulin, Total: 2.4 g/dL (ref 2.2–3.9)
IgA: 71 mg/dL (ref 61–437)
IgG (Immunoglobin G), Serum: 440 mg/dL — ABNORMAL LOW (ref 603–1613)
IgM (Immunoglobulin M), Srm: 64 mg/dL (ref 15–143)
Total Protein ELP: 6 g/dL (ref 6.0–8.5)

## 2024-05-31 NOTE — Telephone Encounter (Signed)
 Pt significant other, Haskell Linker, called requesting something for motion sickness. Per Landa Pine, NP pt needs clearance from dialysis/renal team. Haskell Linker voiced understanding and will speak with pt provider at next dialysis visit. No further needs at this time.

## 2024-06-17 ENCOUNTER — Ambulatory Visit: Payer: Self-pay | Admitting: Nurse Practitioner

## 2024-06-19 ENCOUNTER — Encounter (HOSPITAL_COMMUNITY): Payer: Self-pay | Admitting: Interventional Radiology

## 2024-06-21 ENCOUNTER — Other Ambulatory Visit: Payer: Self-pay | Admitting: Nurse Practitioner

## 2024-06-21 DIAGNOSIS — E854 Organ-limited amyloidosis: Secondary | ICD-10-CM

## 2024-06-21 DIAGNOSIS — F419 Anxiety disorder, unspecified: Secondary | ICD-10-CM

## 2024-06-21 DIAGNOSIS — G893 Neoplasm related pain (acute) (chronic): Secondary | ICD-10-CM

## 2024-06-21 DIAGNOSIS — Z515 Encounter for palliative care: Secondary | ICD-10-CM

## 2024-06-21 DIAGNOSIS — E8581 Light chain (AL) amyloidosis: Secondary | ICD-10-CM

## 2024-06-22 ENCOUNTER — Other Ambulatory Visit: Payer: Self-pay | Admitting: Hematology

## 2024-06-22 ENCOUNTER — Other Ambulatory Visit: Payer: Self-pay | Admitting: Nurse Practitioner

## 2024-06-22 ENCOUNTER — Encounter: Payer: Self-pay | Admitting: Nurse Practitioner

## 2024-06-22 ENCOUNTER — Encounter: Payer: Self-pay | Admitting: Hematology

## 2024-06-22 ENCOUNTER — Inpatient Hospital Stay: Attending: Hematology

## 2024-06-22 DIAGNOSIS — E8581 Light chain (AL) amyloidosis: Secondary | ICD-10-CM | POA: Diagnosis not present

## 2024-06-22 DIAGNOSIS — F419 Anxiety disorder, unspecified: Secondary | ICD-10-CM

## 2024-06-22 DIAGNOSIS — G893 Neoplasm related pain (acute) (chronic): Secondary | ICD-10-CM | POA: Diagnosis not present

## 2024-06-22 DIAGNOSIS — Z515 Encounter for palliative care: Secondary | ICD-10-CM

## 2024-06-23 ENCOUNTER — Encounter: Payer: Self-pay | Admitting: Hematology

## 2024-06-23 ENCOUNTER — Telehealth: Payer: Self-pay | Admitting: Nurse Practitioner

## 2024-06-23 NOTE — Telephone Encounter (Signed)
 Left a voicemail with the scheduled appointment details.

## 2024-06-23 NOTE — Progress Notes (Signed)
 Palliative Medicine [facility]  Telephone:(336) [phone] Fax:(336) [phone]   Name: Caleb Simmons Date: 06/23/2024 MRN: [id number]  DOB: 11-05-1951  Patient Care Team: Lanny Callander, MD as PCP - General (Hematology) Buntin, Chiquita LITTIE, RN (Inactive) as Registered Nurse Pickenpack-Cousar, Fannie SAILOR, NP as Nurse Practitioner (Nurse Practitioner) Lanny Callander, MD as Attending Physician (Hematology and Oncology)   I connected with Caleb Simmons on 06/23/24 at 11:30 AM EDT by telephone and verified that I am speaking with the correct person using two identifiers.   I discussed the limitations, risks, security and privacy concerns of performing an evaluation and management service by telemedicine and the availability of in-person appointments. I also discussed with the patient that there may be a patient responsible charge related to this service. The patient expressed understanding and agreed to proceed.   Other persons participating in the visit and their role in the encounter: Caleb Simmons, Significant Other    Patient's location: Home   Provider's location: [facility]   INTERVAL HISTORY: Caleb Simmons is a 73 y.o. 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  Recently admitted and discharged on 01/20/22 after receiving treatment for left MCA CVA. Palliative following for ongoing goals of care discussions and symptom management.   SOCIAL HISTORY:     reports that he has been smoking cigarettes. He has a 6.3 pack-year smoking history. He has never used smokeless tobacco.  ADVANCE DIRECTIVES:  Patient reports completed document.  Caleb Simmons, [place]. is his healthcare power of attorney.  CODE STATUS: Full code  PAST MEDICAL HISTORY: Past Medical History:  Diagnosis Date   Cancer (HCC)     ALLERGIES:  is allergic to iron sucrose.  MEDICATIONS:  Current  Outpatient Medications  Medication Sig Dispense Refill   acetaminophen  (TYLENOL ) 500 MG tablet Take 1,000 mg by mouth every 6 (six) hours as needed for moderate pain.     ALPRAZolam  (XANAX ) 1 MG tablet TAKE 1 TABLET BY MOUTH 3 TIMES DAILY AS NEEDED FOR ANXIETY OR SLEEP 90 tablet 0   aspirin  81 MG EC tablet Take 1 tablet (81 mg total) by mouth daily. Swallow whole. 30 tablet 11   dronabinol  (MARINOL ) 2.5 MG capsule Take 1 capsule (2.5 mg total) by mouth 2 (two) times daily before a meal. 60 capsule 2   fluticasone  (FLONASE ) 50 MCG/ACT nasal spray Place 1 spray into both nostrils daily. 16 g 5   folic acid  (FOLVITE ) 1 MG tablet TAKE 2 TABLETS BY MOUTH EVERY DAY 60 tablet 0   metoprolol  succinate (TOPROL -XL) 25 MG 24 hr tablet TAKE 1/2 TABLET BY MOUTH EVERY DAY 45 tablet 3   mirtazapine  (REMERON ) 30 MG tablet TAKE 1 TABLET BY MOUTH AT BEDTIME 30 tablet 1   Nutritional Supplements (FEEDING SUPPLEMENT, NEPRO CARB STEADY,) LIQD Take 237 mLs by mouth 2 (two) times daily between meals.     oxyCODONE  (ROXICODONE ) 15 MG immediate release tablet TAKE 1 TABLET BY MOUTH EVERY 4 HOURS AS NEEDED 90 tablet 0   pantoprazole  (PROTONIX ) 40 MG tablet TAKE 1 TABLET BY MOUTH 2 TIMES DAILY BEFORE MEALS 60 tablet 0   prochlorperazine  (COMPAZINE ) 5 MG tablet TAKE 1 OR 2 TABLETS BY MOUTH EVERY 8 HOURS AS NEEDED FOR NAUSEA AND VOMITING 30 tablet 1   tamsulosin  (FLOMAX ) 0.4 MG CAPS capsule TAKE 1 CAPSULE BY MOUTH EVERY DAY 30 capsule 0   zolpidem  (  AMBIEN ) 5 MG tablet TAKE 1 TABLET BY MOUTH AT BEDTIME AS NEEDED FOR SLEEP 30 tablet 3   No current facility-administered medications for this visit.   Facility-Administered Medications Ordered in Other Visits  Medication Dose Route Frequency Provider Last Rate Last Admin   0.9 %  sodium chloride  infusion (Manually program via Guardrails IV Fluids)  250 mL Intravenous Once Lanny Callander, MD       heparin  lock flush 100 unit/mL  500 Units Intracatheter Once Lanny Callander, MD       sodium  chloride flush (NS) 0.9 % injection 10 mL  10 mL Intracatheter Once Lanny Callander, MD        VITAL SIGNS: There were no vitals taken for this visit. There were no vitals filed for this visit.    Estimated body mass index is 22.86 kg/m as calculated from the following:   Height as of 02/25/24: 5' 10 (1.778 m).   Weight as of 05/27/24: 159 lb 4.8 oz (72.3 kg).   PERFORMANCE STATUS (ECOG) : 2 - Symptomatic, <50% confined to bed  Discussed the use of AI scribe software for clinical note transcription with the patient, who gave verbal consent to proceed.   IMPRESSION: Discussed the use of AI scribe software for clinical note transcription with the patient, who gave verbal consent to proceed.  History of Present Illness  I connected by phone with Caleb Simmons for symptom management follow-up.  His Significant Other, Caleb Simmons is present. Patient is doing well overall. Tolerating dialysis twice weekly. Reports occasional fatigue. Appetite is improving. Some recent nausea however this is controlled with medications. He mentions that he has recently purchased a boat about a month ago and plans to go out on it later today. Reminded patient to be mindful of timeframe in the sun and to maintain hydration.  He is also planning a trip in July to the beach which he is looking forward to.   Denies concerns for nausea, vomiting, constipation, or diarrhea. He is sleeping well at night and anxiety well controlled with as needed use of Xanax . Pain managed on current regimen of oxycodone . Does not require around the clock. Refills appropriate with timing and use. No adjustments to regimen.   Overall Caleb Simmons feels his symptoms are well managed. No adjustments at this time. Will continue to support and follow.  Assessment and Plan  Chronic Cancer Related Pain Stable on current regimen. No constipation reported. -Continue Oxycodone  15mg  as needed.   Poor Appetite Fluctuating appetite with some improvement.  Weight increased from 154 to 159 since last visit. -Attempt to refill Marinol  5mg  prescription. Remains on backorder.  -Continue Mirtazapine  at bedtime.   Insomnia Persistent difficulty sleeping. -Continue current regimen which includes Mirtazapine  30mg  at bedtime.   Anxiety Managed with Xanax . -Continue Xanax  0.25mg  as needed.  Dialysis Ongoing, twice weekly. -Continue current schedule.  Nausea Prescription for Compazine  sent by Dr. Lanny.   Follow-up in approximately six weeks by phone, and in-person in three months.  Patient expressed understanding and was in agreement with this plan. He also understands that He can call the clinic at any time with any questions, concerns, or complaints.     Any controlled substances utilized were prescribed in the context of palliative care. PDMP has been reviewed.    I personally spent a total of 25 minutes in the care of the patient today including preparing to see the patient, getting/reviewing separately obtained history, counseling and educating, placing orders, documenting clinical information  in the EHR, and coordinating care. Visit consisted of counseling and education dealing with the complex and emotionally intense issues of symptom management and palliative care in the setting of serious and potentially life-threatening illness.  Levon Borer, AGPCNP-BC  Palliative Medicine Team/[facility]

## 2024-06-24 ENCOUNTER — Other Ambulatory Visit: Payer: Self-pay

## 2024-06-24 ENCOUNTER — Other Ambulatory Visit: Payer: Self-pay | Admitting: Hematology

## 2024-06-24 DIAGNOSIS — Z515 Encounter for palliative care: Secondary | ICD-10-CM

## 2024-06-24 DIAGNOSIS — R110 Nausea: Secondary | ICD-10-CM

## 2024-06-24 MED ORDER — PROCHLORPERAZINE MALEATE 5 MG PO TABS: 5.0000 mg | ORAL_TABLET | Freq: Three times a day (TID) | ORAL | 1 refills | Status: DC | PRN

## 2024-07-14 ENCOUNTER — Telehealth: Payer: Self-pay | Admitting: Nurse Practitioner

## 2024-07-14 ENCOUNTER — Other Ambulatory Visit: Payer: Self-pay | Admitting: Nurse Practitioner

## 2024-07-14 DIAGNOSIS — Z515 Encounter for palliative care: Secondary | ICD-10-CM

## 2024-07-14 DIAGNOSIS — R110 Nausea: Secondary | ICD-10-CM

## 2024-07-14 NOTE — Telephone Encounter (Signed)
 Left patient a vm regarding upcoming appointment

## 2024-07-22 ENCOUNTER — Other Ambulatory Visit: Payer: Self-pay | Admitting: Hematology

## 2024-07-30 ENCOUNTER — Other Ambulatory Visit: Payer: Self-pay | Admitting: Nurse Practitioner

## 2024-07-30 DIAGNOSIS — E8581 Light chain (AL) amyloidosis: Secondary | ICD-10-CM

## 2024-07-30 DIAGNOSIS — R110 Nausea: Secondary | ICD-10-CM

## 2024-07-30 DIAGNOSIS — E854 Organ-limited amyloidosis: Secondary | ICD-10-CM

## 2024-07-30 DIAGNOSIS — F419 Anxiety disorder, unspecified: Secondary | ICD-10-CM

## 2024-07-30 DIAGNOSIS — Z515 Encounter for palliative care: Secondary | ICD-10-CM

## 2024-07-30 DIAGNOSIS — G893 Neoplasm related pain (acute) (chronic): Secondary | ICD-10-CM

## 2024-08-03 ENCOUNTER — Inpatient Hospital Stay: Attending: Hematology | Admitting: Nurse Practitioner

## 2024-08-03 DIAGNOSIS — Z515 Encounter for palliative care: Secondary | ICD-10-CM | POA: Diagnosis not present

## 2024-08-03 DIAGNOSIS — R110 Nausea: Secondary | ICD-10-CM

## 2024-08-03 DIAGNOSIS — R634 Abnormal weight loss: Secondary | ICD-10-CM

## 2024-08-03 DIAGNOSIS — R630 Anorexia: Secondary | ICD-10-CM

## 2024-08-03 DIAGNOSIS — E8581 Light chain (AL) amyloidosis: Secondary | ICD-10-CM

## 2024-08-03 DIAGNOSIS — G893 Neoplasm related pain (acute) (chronic): Secondary | ICD-10-CM | POA: Diagnosis not present

## 2024-08-05 ENCOUNTER — Encounter: Payer: Self-pay | Admitting: Hematology

## 2024-08-05 ENCOUNTER — Encounter: Payer: Self-pay | Admitting: Nurse Practitioner

## 2024-08-05 MED ORDER — OXYCODONE HCL 15 MG PO TABS
15.0000 mg | ORAL_TABLET | ORAL | 0 refills | Status: DC | PRN
Start: 2024-08-14 — End: 2024-09-08

## 2024-08-05 MED ORDER — DRONABINOL 5 MG PO CAPS: 5.0000 mg | ORAL_CAPSULE | Freq: Two times a day (BID) | ORAL | 2 refills | Status: AC

## 2024-08-05 NOTE — Progress Notes (Signed)
 Palliative Medicine [facility]  Telephone:(336) [phone] Fax:(336) [phone]   Name: LORETTA KLUENDER Date: 08/05/2024 MRN: [id number]  DOB: 07/07/1951  Patient Care Team: Lanny Callander, MD as PCP - General (Hematology) Buntin, Chiquita LITTIE, RN (Inactive) as Registered Nurse Pickenpack-Cousar, Fannie SAILOR, NP as Nurse Practitioner (Nurse Practitioner) Lanny Callander, MD as Attending Physician (Hematology and Oncology)   I connected with Dale LITTIE Mac on 08/03/24 at  3:15 PM EDT by telephone and verified that I am speaking with the correct person using two identifiers.   I discussed the limitations, risks, security and privacy concerns of performing an evaluation and management service by telemedicine and the availability of in-person appointments. I also discussed with the patient that there may be a patient responsible charge related to this service. The patient expressed understanding and agreed to proceed.   Other persons participating in the visit and their role in the encounter: Inocente, Significant Other    Patient's location: Home   Provider's location: [facility]   INTERVAL HISTORY: TREJON DUFORD is a 73 y.o. 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  Recently admitted and discharged on 01/20/22 after receiving treatment for left MCA CVA. Palliative following for ongoing goals of care discussions and symptom management.   SOCIAL HISTORY:     reports that he has been smoking cigarettes. He has a 6.3 pack-year smoking history. He has never used smokeless tobacco.  ADVANCE DIRECTIVES:  Patient reports completed document.  Son Donatello, Kleve. is his healthcare power of attorney.  CODE STATUS: Full code  PAST MEDICAL HISTORY: Past Medical History:  Diagnosis Date   Cancer (HCC)     ALLERGIES:  is allergic to iron sucrose.  MEDICATIONS:  Current  Outpatient Medications  Medication Sig Dispense Refill   acetaminophen (TYLENOL) 500 MG tablet Take 1,000 mg by mouth every 6 (six) hours as needed for moderate pain.     ALPRAZolam (XANAX) 1 MG tablet TAKE 1 TABLET BY MOUTH 3 TIMES DAILY AS NEEDED FOR ANXIETY OR SLEEP 90 tablet 0   aspirin 81 MG EC tablet Take 1 tablet (81 mg total) by mouth daily. Swallow whole. 30 tablet 11   dronabinol (MARINOL) 2.5 MG capsule Take 1 capsule (2.5 mg total) by mouth 2 (two) times daily before a meal. 60 capsule 2   fluticasone (FLONASE) 50 MCG/ACT nasal spray Place 1 spray into both nostrils daily. 16 g 5   folic acid (FOLVITE) 1 MG tablet TAKE 2 TABLETS BY MOUTH EVERY DAY 60 tablet 0   metoprolol succinate (TOPROL-XL) 25 MG 24 hr tablet TAKE 1/2 TABLET BY MOUTH EVERY DAY 45 tablet 3   mirtazapine (REMERON) 30 MG tablet TAKE 1 TABLET BY MOUTH AT BEDTIME 30 tablet 1   Nutritional Supplements (FEEDING SUPPLEMENT, NEPRO CARB STEADY,) LIQD Take 237 mLs by mouth 2 (two) times daily between meals.     oxyCODONE (ROXICODONE) 15 MG immediate release tablet TAKE 1 TABLET BY MOUTH EVERY 4 HOURS AS NEEDED 90 tablet 0   pantoprazole (PROTONIX) 40 MG tablet TAKE 1 TABLET BY MOUTH 2 TIMES DAILY BEFORE MEALS 60 tablet 0   prochlorperazine (COMPAZINE) 5 MG tablet TAKE 1 TABLET BY MOUTH EVERY 8 HOURS AS NEEDED FOR NAUSEA AND VOMITING 30 tablet 1   tamsulosin (FLOMAX) 0.4 MG CAPS capsule TAKE 1 CAPSULE BY MOUTH EVERY DAY 30 capsule 0   zolpidem (AMBIEN)  5 MG tablet TAKE 1 TABLET BY MOUTH AT BEDTIME AS NEEDED FOR SLEEP 30 tablet 3   No current facility-administered medications for this visit.   Facility-Administered Medications Ordered in Other Visits  Medication Dose Route Frequency Provider Last Rate Last Admin   0.9 %  sodium chloride infusion (Manually program via Guardrails IV Fluids)  250 mL Intravenous Once Lanny Callander, MD       heparin lock flush 100 unit/mL  500 Units Intracatheter Once Lanny Callander, MD       sodium  chloride flush (NS) 0.9 % injection 10 mL  10 mL Intracatheter Once Lanny Callander, MD        VITAL SIGNS: There were no vitals taken for this visit. There were no vitals filed for this visit.    Estimated body mass index is 22.86 kg/m as calculated from the following:   Height as of 02/25/24: 5' 10 (1.778 m).   Weight as of 05/27/24: 159 lb 4.8 oz (72.3 kg).   PERFORMANCE STATUS (ECOG) : 2 - Symptomatic, <50% confined to bed  Discussed the use of AI scribe software for clinical note transcription with the patient, who gave verbal consent to proceed.   IMPRESSION: Discussed the use of AI scribe software for clinical note transcription with the patient, who gave verbal consent to proceed.  History of Present Illness  I connected by phone with Mr. KAY RICCIUTI for symptom management follow-up.  His Significant Other, Inocente is present. Patient is doing well overall. Reports occasional fatigue. Appetite is good. Denies weight loss. Shares he enjoyed recent beach vacation with his family.   Denies concerns for nausea, vomiting, constipation, or diarrhea. He is sleeping well at night and anxiety well controlled with as needed use of Xanax. Pain managed on current regimen of oxycodone. He has been experiencing pain in his right knee, which is currently improving. He reports that he is able to engage in activities such as working on [employer] Does not require around the clock. Refills appropriate with timing and use. No adjustments to regimen.   Overall Mr. Marcy feels his symptoms are well managed. No adjustments at this time. Will continue to support and follow.  Assessment and Plan  Chronic Cancer Related Pain Stable on current regimen. No constipation reported. -Continue Oxycodone 15mg  as needed.   Poor Appetite Fluctuating appetite with some improvement.  -Attempt to refill Marinol 5mg  prescription. Remains on backorder.  -Continue Mirtazapine at bedtime.   Insomnia Persistent  difficulty sleeping. -Continue current regimen which includes Mirtazapine 30mg  at bedtime.   Anxiety Managed with Xanax. -Continue Xanax 0.25mg  as needed.  Dialysis Ongoing, twice weekly. -Continue current schedule.  Nausea Prescription for Compazine sent by Dr. Lanny.   Follow-up in approximately six weeks by phone, and in-person in three months.  Patient expressed understanding and was in agreement with this plan. He also understands that He can call the clinic at any time with any questions, concerns, or complaints.     Any controlled substances utilized were prescribed in the context of palliative care. PDMP has been reviewed.    I provided 30 minutes of non face-to-face telephone visit time during this encounter, and > 50% was spent counseling as documented under my assessment & plan.  Visit consisted of counseling and education dealing with the complex and emotionally intense issues of symptom management and palliative care in the setting of serious and potentially life-threatening illness.  Levon Borer, AGPCNP-BC  Palliative Medicine Team/[facility]

## 2024-08-17 ENCOUNTER — Other Ambulatory Visit: Payer: Self-pay | Admitting: Hematology

## 2024-08-17 ENCOUNTER — Other Ambulatory Visit: Payer: Self-pay | Admitting: Nurse Practitioner

## 2024-08-18 ENCOUNTER — Other Ambulatory Visit: Payer: Self-pay | Admitting: Nurse Practitioner

## 2024-08-18 DIAGNOSIS — Z515 Encounter for palliative care: Secondary | ICD-10-CM

## 2024-08-18 DIAGNOSIS — R110 Nausea: Secondary | ICD-10-CM

## 2024-08-25 ENCOUNTER — Other Ambulatory Visit: Payer: Self-pay

## 2024-08-25 DIAGNOSIS — D638 Anemia in other chronic diseases classified elsewhere: Secondary | ICD-10-CM

## 2024-08-25 DIAGNOSIS — R530 Neoplastic (malignant) related fatigue: Secondary | ICD-10-CM

## 2024-08-25 DIAGNOSIS — E8581 Light chain (AL) amyloidosis: Secondary | ICD-10-CM

## 2024-08-25 NOTE — Assessment & Plan Note (Signed)
-  lamda light chain disease with renal, cardiac and neuro involvement   -diagnosed in 08/2021 -not a candidate for transplant  --He began weekly oral Cytoxan, dexa and Velcade injection while inpatient on 09/10/21.  He tolerated well. -He began daratumumab injection 09/27/21. Chemo was held briefly following stroke 01/14/22. -not a candidate for biphosphonate due to renal failure -he completed 6 cycle induction chemo with CyBorD on 03/21/22 and moved to monthly Dara maintenance on 03/28/22. He completed a total of 2 year therapy in 08/2023 -He has had very good partial response to treatment based on his M protein and light chain level. -we previously discussed options of treatment for relapsed disease  -continue surveillance.

## 2024-08-26 ENCOUNTER — Inpatient Hospital Stay

## 2024-08-26 ENCOUNTER — Inpatient Hospital Stay: Attending: Hematology | Admitting: Hematology

## 2024-08-26 ENCOUNTER — Encounter

## 2024-08-26 VITALS — BP 120/88 | HR 98 | Temp 98.3°F | Resp 17 | Wt 156.4 lb

## 2024-08-26 DIAGNOSIS — N186 End stage renal disease: Secondary | ICD-10-CM | POA: Diagnosis not present

## 2024-08-26 DIAGNOSIS — E8581 Light chain (AL) amyloidosis: Secondary | ICD-10-CM | POA: Insufficient documentation

## 2024-08-26 DIAGNOSIS — R110 Nausea: Secondary | ICD-10-CM | POA: Diagnosis not present

## 2024-08-26 DIAGNOSIS — R530 Neoplastic (malignant) related fatigue: Secondary | ICD-10-CM

## 2024-08-26 DIAGNOSIS — Z992 Dependence on renal dialysis: Secondary | ICD-10-CM | POA: Insufficient documentation

## 2024-08-26 DIAGNOSIS — D638 Anemia in other chronic diseases classified elsewhere: Secondary | ICD-10-CM

## 2024-08-26 LAB — CBC WITH DIFFERENTIAL (CANCER CENTER ONLY)
Abs Immature Granulocytes: 0.05 K/uL (ref 0.00–0.07)
Basophils Absolute: 0.1 K/uL (ref 0.0–0.1)
Basophils Relative: 1 %
Eosinophils Absolute: 0.2 K/uL (ref 0.0–0.5)
Eosinophils Relative: 2 %
HCT: 28 % — ABNORMAL LOW (ref 39.0–52.0)
Hemoglobin: 9.2 g/dL — ABNORMAL LOW (ref 13.0–17.0)
Immature Granulocytes: 1 %
Lymphocytes Relative: 19 %
Lymphs Abs: 1.9 K/uL (ref 0.7–4.0)
MCH: 28.4 pg (ref 26.0–34.0)
MCHC: 32.9 g/dL (ref 30.0–36.0)
MCV: 86.4 fL (ref 80.0–100.0)
Monocytes Absolute: 0.5 K/uL (ref 0.1–1.0)
Monocytes Relative: 5 %
Neutro Abs: 7.4 K/uL (ref 1.7–7.7)
Neutrophils Relative %: 72 %
Platelet Count: 198 K/uL (ref 150–400)
RBC: 3.24 MIL/uL — ABNORMAL LOW (ref 4.22–5.81)
RDW: 14.8 % (ref 11.5–15.5)
WBC Count: 10.1 K/uL (ref 4.0–10.5)
nRBC: 0 % (ref 0.0–0.2)

## 2024-08-26 LAB — CMP (CANCER CENTER ONLY)
ALT: 6 U/L (ref 0–44)
AST: 6 U/L — ABNORMAL LOW (ref 15–41)
Albumin: 3.7 g/dL (ref 3.5–5.0)
Alkaline Phosphatase: 77 U/L (ref 38–126)
Anion gap: 14 (ref 5–15)
BUN: 56 mg/dL — ABNORMAL HIGH (ref 8–23)
CO2: 22 mmol/L (ref 22–32)
Calcium: 8.2 mg/dL — ABNORMAL LOW (ref 8.9–10.3)
Chloride: 103 mmol/L (ref 98–111)
Creatinine: 8.29 mg/dL (ref 0.61–1.24)
GFR, Estimated: 6 mL/min — ABNORMAL LOW (ref >60)
Glucose, Bld: 104 mg/dL — ABNORMAL HIGH (ref 70–99)
Potassium: 4.5 mmol/L (ref 3.5–5.1)
Sodium: 139 mmol/L (ref 135–145)
Total Bilirubin: 0.3 mg/dL (ref 0.0–1.2)
Total Protein: 6.7 g/dL (ref 6.5–8.1)

## 2024-08-26 LAB — FERRITIN: Ferritin: 404 ng/mL — ABNORMAL HIGH (ref 24–336)

## 2024-08-26 NOTE — Progress Notes (Signed)
 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  He tolerated well. -He began daratumumab  injection 09/27/21. Chemo was held briefly following stroke 01/14/22. -not a candidate for biphosphonate due to renal failure -he completed 6 cycle induction chemo with CyBorD on 03/21/22 and moved to monthly Dara maintenance on 03/28/22. He completed a total of 2 year therapy in 08/2023 -He has had very good partial response to treatment based on his M protein and light chain level. -we previously discussed options of treatment for relapsed disease  -continue surveillance.    Assessment & Plan Light chain (AL) amyloidosis AL amyloidosis is well-managed with no new symptoms. Previous multiple myeloma panel was negative for M protein and light chain levels were within normal range. - Advise to report any new symptoms such as pain or other problems.  End-stage renal disease on dialysis End-stage renal disease is managed with dialysis twice a week. Creatinine is elevated due to dialysis dependency with no new issues reported. - Continue dialysis twice a week. - Ensure dialysis setup in [place] during upcoming surgery.  Nausea Experiencing nausea, managed with Compazine . Current prescription may not be sufficient for upcoming needs post-surgery. -  Refill Compazine  with consideration for a larger quantity to cover post-surgery period.  PLAN - Patient is clinically stable, labs reviewed, multiple myeloma panel results still pending - Lab and follow-up in 3 months - He will see palliative care NP Nikki next week.   SUMMARY OF ONCOLOGIC HISTORY: Oncology History   No history exists.     Discussed the use of AI scribe software for clinical note transcription with the patient, who gave verbal consent to proceed.  History of Present Illness Caleb Simmons is a 73 year old male with amyloidosis who presents for follow-up.  He undergoes dialysis twice a week without pain or discomfort and maintains decent energy levels. Nausea is effectively managed with Compazine  taken two to three times daily. He is preparing for surgery on the 22nd and is coordinating medication management with his caregiver. He will stay with his son in [place] during recovery, where his caregiver is arranging dialysis. No new problems, swelling, changes in appetite, or breathing issues are present. He has a port for dialysis.     All other systems were reviewed with the patient and are negative.  MEDICAL HISTORY:  Past Medical History:  Diagnosis Date   Cancer [facility])     SURGICAL HISTORY: Past Surgical History:  Procedure Laterality Date   APPENDECTOMY     IR ANGIO INTRA EXTRACRAN SEL COM CAROTID INNOMINATE BILAT MOD SED  01/16/2022   IR ANGIO INTRA EXTRACRAN SEL COM CAROTID INNOMINATE BILAT MOD SED  07/16/2022   IR ANGIO VERTEBRAL SEL SUBCLAVIAN INNOMINATE BILAT MOD SED  01/16/2022   IR ANGIO VERTEBRAL SEL SUBCLAVIAN INNOMINATE UNI R MOD SED  07/16/2022   IR ANGIO VERTEBRAL SEL VERTEBRAL UNI L  MOD SED  07/16/2022   IR CT HEAD LTD  01/18/2022   IR EMBO ART  VEN HEMORR LYMPH EXTRAV  INC GUIDE ROADMAPPING  08/30/2021   IR FLUORO GUIDE CV LINE RIGHT  08/30/2021   IR FLUORO GUIDE CV LINE RIGHT  09/18/2021   IR FLUORO GUIDE CV LINE RIGHT  05/02/2022   IR FLUORO GUIDE CV  LINE RIGHT  10/31/2023   IR PERCUTANEOUS ART THROMBECTOMY/INFUSION INTRACRANIAL INC DIAG ANGIO  01/18/2022   IR RADIOLOGIST EVAL & MGMT  08/02/2022   IR REMOVAL TUN CV CATH W/O FL  10/28/2023   IR RENAL SELECTIVE  UNI INC S&I MOD SED  08/31/2021   IR US  GUIDE VASC ACCESS RIGHT  08/30/2021   IR US  GUIDE VASC ACCESS RIGHT  08/30/2021   IR US  GUIDE VASC ACCESS RIGHT  09/18/2021   IR US  GUIDE VASC ACCESS RIGHT  01/16/2022   IR US  GUIDE VASC ACCESS RIGHT  07/16/2022   IR US  GUIDE VASC ACCESS RIGHT  10/31/2023   IR VENOCAVAGRAM SVC  05/02/2022   RADIOLOGY WITH ANESTHESIA N/A 01/18/2022   Procedure: Cerebral angioplasty with possible stenting;  Surgeon: [name] Carrion, MD;  Location: [facility] OR;  Service: Radiology;  Laterality: N/A;   TRANSESOPHAGEAL ECHOCARDIOGRAM (CATH LAB) N/A 10/30/2023   Procedure: TRANSESOPHAGEAL ECHOCARDIOGRAM;  Surgeon: Michele Richardson, DO;  Location: [facility] INVASIVE CV LAB;  Service: Cardiovascular;  Laterality: N/A;    I have reviewed the social history and family history with the patient and they are unchanged from previous note.  ALLERGIES:  is allergic to iron sucrose.  MEDICATIONS:  Current Outpatient Medications  Medication Sig Dispense Refill   acetaminophen  (TYLENOL ) 500 MG tablet Take 1,000 mg by mouth every 6 (six) hours as needed for moderate pain.     ALPRAZolam  (XANAX ) 1 MG tablet TAKE 1 TABLET BY MOUTH 3 TIMES DAILY AS NEEDED FOR ANXIETY OR SLEEP 90 tablet 0   aspirin  81 MG EC tablet Take 1 tablet (81 mg total) by mouth daily. Swallow whole. 30 tablet 11   dronabinol  (MARINOL ) 5 MG capsule Take 1 capsule (5 mg total) by mouth 2 (two) times daily before a meal. 60 capsule 2   fluticasone  (FLONASE ) 50 MCG/ACT nasal spray Place 1 spray into both nostrils daily. 16 g 5   folic acid  (FOLVITE ) 1 MG tablet TAKE 2 TABLETS BY MOUTH EVERY DAY 60 tablet 0   metoprolol  succinate (TOPROL -XL) 25 MG 24 hr tablet TAKE 1/2 TABLET BY MOUTH EVERY DAY 45 tablet 3   mirtazapine  (REMERON ) 30 MG  tablet TAKE 1 TABLET BY MOUTH AT BEDTIME 30 tablet 1   Nutritional Supplements (FEEDING SUPPLEMENT, NEPRO CARB STEADY,) LIQD Take 237 mLs by mouth 2 (two) times daily between meals.     oxyCODONE  (ROXICODONE ) 15 MG immediate release tablet Take 1 tablet (15 mg total) by mouth every 4 (four) hours as needed. 90 tablet 0   pantoprazole  (PROTONIX ) 40 MG tablet TAKE 1 TABLET BY MOUTH 2 TIMES DAILY BEFORE MEALS 60 tablet 0   prochlorperazine  (COMPAZINE ) 5 MG tablet TAKE 1 TABLET BY MOUTH EVERY 8 HOURS AS NEEDED FOR NAUSEA AND VOMITING 30 tablet 1   tamsulosin  (FLOMAX ) 0.4 MG CAPS capsule TAKE 1 CAPSULE BY MOUTH EVERY DAY 30 capsule 0   zolpidem  (AMBIEN ) 5 MG tablet TAKE 1 TABLET BY MOUTH AT BEDTIME AS NEEDED FOR SLEEP 30 tablet 3   No current facility-administered medications for this visit.   Facility-Administered Medications Ordered in Other Visits  Medication  Dose Route Frequency Provider Last Rate Last Admin   0.9 %  sodium chloride  infusion (Manually program via Guardrails IV Fluids)  250 mL Intravenous Once Lanny Callander, MD       heparin  lock flush 100 unit/mL  500 Units Intracatheter Once Lanny Callander, MD       sodium chloride  flush (NS) 0.9 % injection 10 mL  10 mL Intracatheter Once Lanny Callander, MD        PHYSICAL EXAMINATION: ECOG PERFORMANCE STATUS: 2 - Symptomatic, <50% confined to bed  Vitals:   08/26/24 0828  BP: 120/88  Pulse: 98  Resp: 17  Temp: 98.3 F (36.8 C)  SpO2: 97%   Wt Readings from Last 3 Encounters:  08/26/24 156 lb 6.4 oz (70.9 kg)  05/27/24 159 lb 4.8 oz (72.3 kg)  04/13/24 151 lb 6.4 oz (68.7 kg)     GENERAL:alert, no distress and comfortable SKIN: skin color, texture, turgor are normal, no rashes or significant lesions EYES: normal, Conjunctiva are pink and non-injected, sclera clear NECK: supple, thyroid normal size, non-tender, without nodularity LYMPH:  no palpable lymphadenopathy in the cervical, axillary  LUNGS: clear to auscultation and percussion with  normal breathing effort HEART: regular rate & rhythm and no murmurs and no lower extremity edema ABDOMEN:abdomen soft, non-tender and normal bowel sounds Musculoskeletal:no cyanosis of digits and no clubbing  NEURO: alert & oriented x 3 with fluent speech, no focal motor/sensory deficits  Physical Exam    LABORATORY DATA:  I have reviewed the data as listed    Latest Ref Rng & Units 08/26/2024    7:42 AM 05/27/2024    7:28 AM 02/25/2024    8:14 AM  CBC  WBC 4.0 - 10.5 K/uL 10.1  7.8  8.7   Hemoglobin 13.0 - 17.0 g/dL 9.2  9.4  9.6   Hematocrit 39.0 - 52.0 % 28.0  27.9  30.0   Platelets 150 - 400 K/uL 198  165  216         Latest Ref Rng & Units 08/26/2024    7:42 AM 05/27/2024    7:28 AM 02/25/2024    8:14 AM  CMP  Glucose 70 - 99 mg/dL 895  90  96   BUN 8 - 23 mg/dL 56  61  44   Creatinine 0.61 - 1.24 mg/dL 1.70  2.54  4.08   Sodium 135 - 145 mmol/L 139  136  136   Potassium 3.5 - 5.1 mmol/L 4.5  4.5  4.4   Chloride 98 - 111 mmol/L 103  102  99   CO2 22 - 32 mmol/L 22  22  25    Calcium  8.9 - 10.3 mg/dL 8.2  7.9  8.6   Total Protein 6.5 - 8.1 g/dL 6.7  6.4  6.5   Total Bilirubin 0.0 - 1.2 mg/dL 0.3  0.3  0.4   Alkaline Phos 38 - 126 U/L 77  71  87   AST 15 - 41 U/L 6  8  7    ALT 0 - 44 U/L 6  5  5        RADIOGRAPHIC STUDIES: I have personally reviewed the radiological images as listed and agreed with the findings in the report. No results found.    No orders of the defined types were placed in this encounter.  All questions were answered. The patient knows to call the clinic with any problems, questions or concerns. No barriers to learning was detected. The total time spent in  the appointment was 20 minutes, including review of chart and various tests results, discussions about plan of care and coordination of care plan     Onita Mattock, MD 08/26/2024

## 2024-08-26 NOTE — Progress Notes (Signed)
 Critical lab reported Scr+ 8.29  Notified Dr.Feng  Pt is on dialysis

## 2024-08-27 ENCOUNTER — Other Ambulatory Visit: Payer: Self-pay | Admitting: Nurse Practitioner

## 2024-08-27 DIAGNOSIS — Z515 Encounter for palliative care: Secondary | ICD-10-CM

## 2024-08-27 DIAGNOSIS — E8581 Light chain (AL) amyloidosis: Secondary | ICD-10-CM

## 2024-08-27 DIAGNOSIS — I43 Cardiomyopathy in diseases classified elsewhere: Secondary | ICD-10-CM

## 2024-08-27 DIAGNOSIS — F419 Anxiety disorder, unspecified: Secondary | ICD-10-CM

## 2024-08-27 LAB — KAPPA/LAMBDA LIGHT CHAINS
Kappa free light chain: 75 mg/L — ABNORMAL HIGH (ref 3.3–19.4)
Kappa, lambda light chain ratio: 2.07 — ABNORMAL HIGH (ref 0.26–1.65)
Lambda free light chains: 36.3 mg/L — ABNORMAL HIGH (ref 5.7–26.3)

## 2024-08-30 LAB — MULTIPLE MYELOMA PANEL, SERUM
Albumin SerPl Elph-Mcnc: 3.1 g/dL (ref 2.9–4.4)
Albumin/Glob SerPl: 1.2 (ref 0.7–1.7)
Alpha 1: 0.4 g/dL (ref 0.0–0.4)
Alpha2 Glob SerPl Elph-Mcnc: 1 g/dL (ref 0.4–1.0)
B-Globulin SerPl Elph-Mcnc: 0.9 g/dL (ref 0.7–1.3)
Gamma Glob SerPl Elph-Mcnc: 0.5 g/dL (ref 0.4–1.8)
Globulin, Total: 2.8 g/dL (ref 2.2–3.9)
IgA: 97 mg/dL (ref 61–437)
IgG (Immunoglobin G), Serum: 517 mg/dL — ABNORMAL LOW (ref 603–1613)
IgM (Immunoglobulin M), Srm: 77 mg/dL (ref 15–143)
Total Protein ELP: 5.9 g/dL — ABNORMAL LOW (ref 6.0–8.5)

## 2024-08-31 ENCOUNTER — Encounter: Payer: Self-pay | Admitting: Nurse Practitioner

## 2024-08-31 ENCOUNTER — Inpatient Hospital Stay (HOSPITAL_BASED_OUTPATIENT_CLINIC_OR_DEPARTMENT_OTHER): Admitting: Nurse Practitioner

## 2024-08-31 DIAGNOSIS — F419 Anxiety disorder, unspecified: Secondary | ICD-10-CM | POA: Diagnosis not present

## 2024-08-31 DIAGNOSIS — E8581 Light chain (AL) amyloidosis: Secondary | ICD-10-CM

## 2024-08-31 DIAGNOSIS — Z515 Encounter for palliative care: Secondary | ICD-10-CM | POA: Diagnosis not present

## 2024-08-31 DIAGNOSIS — G893 Neoplasm related pain (acute) (chronic): Secondary | ICD-10-CM

## 2024-08-31 NOTE — Progress Notes (Signed)
 Palliative Medicine [facility]  Telephone:(336) [phone] Fax:(336) [phone]   Name: CORDARRIUS COAD Date: 08/31/2024 MRN: [id number]  DOB: October 26, 1951  Patient Care Team: Lanny Callander, MD as PCP - General (Hematology) Buntin, Chiquita LITTIE, RN (Inactive) as Registered Nurse Pickenpack-Cousar, Fannie SAILOR, NP as Nurse Practitioner (Nurse Practitioner) Lanny Callander, MD as Attending Physician (Hematology and Oncology)   I connected with Dale LITTIE Mac on 08/31/2024 at  9:00 AM EDT by telephone and verified that I am speaking with the correct person using two identifiers.   I discussed the limitations, risks, security and privacy concerns of performing an evaluation and management service by telemedicine and the availability of in-person appointments. I also discussed with the patient that there may be a patient responsible charge related to this service. The patient expressed understanding and agreed to proceed.   Other persons participating in the visit and their role in the encounter: Inocente, Significant Other    Patient's location: Home   Provider's location: [facility]   INTERVAL HISTORY: KAICEN DESENA is a 73 y.o. 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  Recently admitted and discharged on 01/20/22 after receiving treatment for left MCA CVA. Palliative following for ongoing goals of care discussions and symptom management.   SOCIAL HISTORY:     reports that he has been smoking cigarettes. He has a 6.3 pack-year smoking history. He has never used smokeless tobacco.  ADVANCE DIRECTIVES:  Patient reports completed document.  Son Izekiel, Flegel. is his healthcare power of attorney.  CODE STATUS: Full code  PAST MEDICAL HISTORY: Past Medical History:  Diagnosis Date   Cancer (HCC)     ALLERGIES:  is allergic to iron sucrose.  MEDICATIONS:  Current  Outpatient Medications  Medication Sig Dispense Refill   acetaminophen  (TYLENOL ) 500 MG tablet Take 1,000 mg by mouth every 6 (six) hours as needed for moderate pain.     ALPRAZolam  (XANAX ) 1 MG tablet TAKE 1 TABLET BY MOUTH 3 TIMES DAILY AS NEEDED FOR ANXIETY OR SLEEP 90 tablet 0   aspirin  81 MG EC tablet Take 1 tablet (81 mg total) by mouth daily. Swallow whole. 30 tablet 11   dronabinol  (MARINOL ) 5 MG capsule Take 1 capsule (5 mg total) by mouth 2 (two) times daily before a meal. 60 capsule 2   fluticasone  (FLONASE ) 50 MCG/ACT nasal spray Place 1 spray into both nostrils daily. 16 g 5   folic acid  (FOLVITE ) 1 MG tablet TAKE 2 TABLETS BY MOUTH EVERY DAY 60 tablet 0   metoprolol  succinate (TOPROL -XL) 25 MG 24 hr tablet TAKE 1/2 TABLET BY MOUTH EVERY DAY 45 tablet 3   mirtazapine  (REMERON ) 30 MG tablet TAKE 1 TABLET BY MOUTH AT BEDTIME 30 tablet 1   Nutritional Supplements (FEEDING SUPPLEMENT, NEPRO CARB STEADY,) LIQD Take 237 mLs by mouth 2 (two) times daily between meals.     oxyCODONE  (ROXICODONE ) 15 MG immediate release tablet Take 1 tablet (15 mg total) by mouth every 4 (four) hours as needed. 90 tablet 0   pantoprazole  (PROTONIX ) 40 MG tablet TAKE 1 TABLET BY MOUTH 2 TIMES DAILY BEFORE MEALS 60 tablet 0   prochlorperazine  (COMPAZINE ) 5 MG tablet TAKE 1 TABLET BY MOUTH EVERY 8 HOURS AS NEEDED FOR NAUSEA AND VOMITING 30 tablet 1   tamsulosin  (FLOMAX ) 0.4 MG CAPS capsule TAKE 1 CAPSULE BY MOUTH EVERY DAY 30 capsule 0  zolpidem  (AMBIEN ) 5 MG tablet TAKE 1 TABLET BY MOUTH AT BEDTIME AS NEEDED FOR SLEEP 30 tablet 3   No current facility-administered medications for this visit.   Facility-Administered Medications Ordered in Other Visits  Medication Dose Route Frequency Provider Last Rate Last Admin   0.9 %  sodium chloride  infusion (Manually program via Guardrails IV Fluids)  250 mL Intravenous Once Lanny Callander, MD       heparin  lock flush 100 unit/mL  500 Units Intracatheter Once Lanny Callander, MD        sodium chloride  flush (NS) 0.9 % injection 10 mL  10 mL Intracatheter Once Lanny Callander, MD        VITAL SIGNS: There were no vitals taken for this visit. There were no vitals filed for this visit.    Estimated body mass index is 22.44 kg/m as calculated from the following:   Height as of 02/25/24: 5' 10 (1.778 m).   Weight as of 08/26/24: 156 lb 6.4 oz (70.9 kg).   PERFORMANCE STATUS (ECOG) : 2 - Symptomatic, <50% confined to bed  Discussed the use of AI scribe software for clinical note transcription with the patient, who gave verbal consent to proceed.   IMPRESSION: Discussed the use of AI scribe software for clinical note transcription with the patient, who gave verbal consent to proceed.  History of Present Illness Caleb Simmons is a 73 year old male who presents for routine follow-up.   I connected by phone with [name] for symptom management follow-up.  His Significant Other, Inocente is present.  Reports occasional fatigue. Appetite is good. Denies weight loss. He is doing well with no current health problems. His medications are effective, and he describes everything as 'lovely'.  He is planning to pick up his boat and go to the beach to catch some fish, indicating that he is active and engaging in leisure activities.  He is sleeping well at night and anxiety well controlled with as needed use of Xanax . Pain managed on current regimen of oxycodone . He has been experiencing pain in his right knee, which is currently improving. He reports that he is able to engage in activities such as working on [employer] Does not require around the clock. Refills appropriate with timing and use. No adjustments to regimen.   Overall Mr. Mottola feels his symptoms are well managed. No adjustments at this time. Will continue to support and follow.  Assessment and Plan  Chronic Cancer Related Pain Stable on current regimen. No constipation reported. -Continue Oxycodone  15mg  as needed.    Poor Appetite Fluctuating appetite with some improvement.  -Attempt to refill Marinol  5mg  prescription. Remains on backorder.  -Continue Mirtazapine  at bedtime.   Insomnia Persistent difficulty sleeping. -Continue current regimen which includes Mirtazapine  30mg  at bedtime.   Anxiety Managed with Xanax . -Continue Xanax  0.25mg  as needed.  Dialysis Ongoing, twice weekly. -Continue current schedule.  Nausea Prescription for Compazine  sent by Dr. Lanny.   Follow-up in approximately six weeks by phone, and in-person in three months.  Patient expressed understanding and was in agreement with this plan. He also understands that He can call the clinic at any time with any questions, concerns, or complaints.     Any controlled substances utilized were prescribed in the context of palliative care. PDMP has been reviewed.    I provided 25 minutes of non face-to-face telephone visit time during this encounter, and > 50% was spent counseling as documented under my assessment & plan.  Visit consisted of counseling and education dealing with the complex and emotionally intense issues of symptom management and palliative care in the setting of serious and potentially life-threatening illness.  Levon Borer, AGPCNP-BC  Palliative Medicine Team/[facility]

## 2024-09-08 ENCOUNTER — Other Ambulatory Visit: Payer: Self-pay | Admitting: Nurse Practitioner

## 2024-09-08 ENCOUNTER — Other Ambulatory Visit: Payer: Self-pay

## 2024-09-08 DIAGNOSIS — R110 Nausea: Secondary | ICD-10-CM

## 2024-09-08 DIAGNOSIS — Z515 Encounter for palliative care: Secondary | ICD-10-CM

## 2024-09-08 DIAGNOSIS — G893 Neoplasm related pain (acute) (chronic): Secondary | ICD-10-CM

## 2024-09-08 DIAGNOSIS — E8581 Light chain (AL) amyloidosis: Secondary | ICD-10-CM

## 2024-09-08 MED ORDER — OXYCODONE HCL 15 MG PO TABS: 15.0000 mg | ORAL_TABLET | ORAL | 0 refills | Status: DC | PRN

## 2024-09-08 MED ORDER — PROCHLORPERAZINE MALEATE 5 MG PO TABS: 5.0000 mg | ORAL_TABLET | Freq: Three times a day (TID) | ORAL | 4 refills | Status: DC | PRN

## 2024-09-08 NOTE — Telephone Encounter (Signed)
 Received a call from pt SO Caleb Simmons, reporting pt has has increased nausea, no vomiting, no fevers, for about 1 week. Caleb Simmons also reports that they have been on vacation for the last week and forgot to bring his compazine  with them, pt has been without anti-emetics for 1 week, when his symptoms started to worsen. Refill sent in of pain meds and compazine . Caleb Simmons also reminded to continue to have pt eat with narcotics as well as to utilize mirilax to prevent constipation to also help prevent nausea. Caleb Simmons was also instructed to update the dialysis providers at pt next dialysis appt. Caleb Simmons verbalized understanding, will call with any more questions or concerns.

## 2024-09-13 ENCOUNTER — Other Ambulatory Visit: Payer: Self-pay | Admitting: Hematology

## 2024-09-13 ENCOUNTER — Other Ambulatory Visit: Payer: Self-pay | Admitting: Nurse Practitioner

## 2024-09-13 DIAGNOSIS — E8581 Light chain (AL) amyloidosis: Secondary | ICD-10-CM

## 2024-09-13 DIAGNOSIS — Z515 Encounter for palliative care: Secondary | ICD-10-CM

## 2024-09-13 DIAGNOSIS — E854 Organ-limited amyloidosis: Secondary | ICD-10-CM

## 2024-09-13 DIAGNOSIS — G4700 Insomnia, unspecified: Secondary | ICD-10-CM

## 2024-10-06 ENCOUNTER — Other Ambulatory Visit: Payer: Self-pay | Admitting: Nurse Practitioner

## 2024-10-06 DIAGNOSIS — E8581 Light chain (AL) amyloidosis: Secondary | ICD-10-CM

## 2024-10-06 DIAGNOSIS — F419 Anxiety disorder, unspecified: Secondary | ICD-10-CM

## 2024-10-06 DIAGNOSIS — E854 Organ-limited amyloidosis: Secondary | ICD-10-CM

## 2024-10-06 DIAGNOSIS — Z515 Encounter for palliative care: Secondary | ICD-10-CM

## 2024-10-06 DIAGNOSIS — G893 Neoplasm related pain (acute) (chronic): Secondary | ICD-10-CM

## 2024-10-07 ENCOUNTER — Other Ambulatory Visit: Payer: Self-pay | Admitting: Hematology

## 2024-10-07 ENCOUNTER — Other Ambulatory Visit: Payer: Self-pay | Admitting: Nurse Practitioner

## 2024-10-12 ENCOUNTER — Inpatient Hospital Stay: Attending: Hematology | Admitting: Nurse Practitioner

## 2024-10-12 ENCOUNTER — Encounter: Payer: Self-pay | Admitting: Nurse Practitioner

## 2024-10-12 DIAGNOSIS — E8581 Light chain (AL) amyloidosis: Secondary | ICD-10-CM

## 2024-10-12 DIAGNOSIS — R630 Anorexia: Secondary | ICD-10-CM | POA: Diagnosis not present

## 2024-10-12 DIAGNOSIS — G893 Neoplasm related pain (acute) (chronic): Secondary | ICD-10-CM

## 2024-10-12 DIAGNOSIS — Z515 Encounter for palliative care: Secondary | ICD-10-CM

## 2024-10-12 DIAGNOSIS — R530 Neoplastic (malignant) related fatigue: Secondary | ICD-10-CM | POA: Diagnosis not present

## 2024-10-12 NOTE — Progress Notes (Signed)
 Palliative Medicine [facility]  [phone] [phone]   Name: Caleb Simmons Date: 10/12/2024 MRN: [id number]  DOB: 12/18/1951  Patient Care Team: Lanny Callander, MD as PCP - General (Hematology) Buntin, Chiquita LITTIE, RN (Inactive) as Registered Nurse Pickenpack-Cousar, Fannie SAILOR, NP as Nurse Practitioner (Nurse Practitioner) Lanny Callander, MD as Attending Physician (Hematology and Oncology)   I connected with Caleb Simmons on 10/12/2024 at  3:00 PM EDT by telephone and verified that I am speaking with the correct person using two identifiers.   I discussed the limitations, risks, security and privacy concerns of performing an evaluation and management service by telemedicine and the availability of in-person appointments. I also discussed with the patient that there may be a patient responsible charge related to this service. The patient expressed understanding and agreed to proceed.   Other persons participating in the visit and their role in the encounter: N/A  [facility]'s location[facility]  [facility]   INTERVAL HISTORY: Caleb Simmons is a 73 y.o. 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  Recently admitted and discharged on 01/20/22 after receiving treatment for left MCA CVA. Palliative following for ongoing goals of care discussions and symptom management.   SOCIAL HISTORY:     reports that he has been smoking cigarettes. He has a 6.3 pack-year smoking history. He has never used smokeless tobacco.  ADVANCE DIRECTIVES:  Patient reports completed document.  [name], Caleb Simmons. is his healthcare power of attorney.  CODE STATUS: Full code  PAST MEDICAL HISTORY: Past Medical History:  Diagnosis Date   Cancer (HCC)     ALLERGIES:  is allergic to iron sucrose.  MEDICATIONS:  Current Outpatient Medications   Medication Sig Dispense Refill   acetaminophen  (TYLENOL ) 500 MG tablet Take 1,000 mg by mouth every 6 (six) hours as needed for moderate pain.     ALPRAZolam  (XANAX ) 1 MG tablet TAKE 1 TABLET BY MOUTH 3 TIMES DAILY AS NEEDED FOR ANXIETY/SLEEP 90 tablet 0   aspirin  81 MG EC tablet Take 1 tablet (81 mg total) by mouth daily. Swallow whole. 30 tablet 11   dronabinol  (MARINOL ) 5 MG capsule Take 1 capsule (5 mg total) by mouth 2 (two) times daily before a meal. 60 capsule 2   fluticasone  (FLONASE ) 50 MCG/ACT nasal spray Place 1 spray into both nostrils daily. 16 g 5   folic acid  (FOLVITE ) 1 MG tablet TAKE 2 TABLETS BY MOUTH EVERY DAY 60 tablet 0   metoprolol  succinate (TOPROL -XL) 25 MG 24 hr tablet TAKE 1/2 TABLET BY MOUTH EVERY DAY 45 tablet 3   mirtazapine  (REMERON ) 30 MG tablet TAKE 1 TABLET BY MOUTH AT BEDTIME 30 tablet 1   Nutritional Supplements (FEEDING SUPPLEMENT, NEPRO CARB STEADY,) LIQD Take 237 mLs by mouth 2 (two) times daily between meals.     oxyCODONE  (ROXICODONE ) 15 MG immediate release tablet TAKE 1 TABLET BY MOUTH EVERY 4 HOURS AS NEEDED 90 tablet 0   pantoprazole  (PROTONIX ) 40 MG tablet TAKE 1 TABLET BY MOUTH 2 TIMES DAILY BEFORE MEALS 60 tablet 0   prochlorperazine  (COMPAZINE ) 5 MG tablet Take 1 tablet (5 mg total) by mouth every 8 (eight) hours as needed for nausea or vomiting. 90 tablet 4   tamsulosin  (FLOMAX ) 0.4 MG CAPS capsule TAKE 1 CAPSULE BY MOUTH EVERY DAY 30 capsule 0   zolpidem  (AMBIEN ) 5 MG  tablet TAKE 1 TABLET BY MOUTH AT BEDTIME AS NEEDED FOR SLEEP 30 tablet 3   No current facility-administered medications for this visit.   Facility-Administered Medications Ordered in Other Visits  Medication Dose Route Frequency Provider Last Rate Last Admin   0.9 %  sodium chloride  infusion (Manually program via Guardrails IV Fluids)  250 mL Intravenous Once Lanny Callander, MD       heparin  lock flush 100 unit/mL  500 Units Intracatheter Once Lanny Callander, MD       sodium chloride  flush  (NS) 0.9 % injection 10 mL  10 mL Intracatheter Once Lanny Callander, MD        VITAL SIGNS: There were no vitals taken for this visit. There were no vitals filed for this visit.    Estimated body mass index is 22.44 kg/m as calculated from the following:   Height as of 02/25/24: 5' 10 (1.778 m).   Weight as of 08/26/24: 156 lb 6.4 oz (70.9 kg).   PERFORMANCE STATUS (ECOG) : 2 - Symptomatic, <50% confined to bed  Discussed the use of AI scribe software for clinical note transcription with the patient, who gave verbal consent to proceed.   IMPRESSION: Discussed the use of AI scribe software for clinical note transcription with the patient, who gave verbal consent to proceed.  History of Present Illness Caleb Simmons is a 73 year old male who I connected with by phone for symptom management follow-up. No acute distress. Denies concerns of nausea, vomiting, constipation, or diarrhea.  Occasional fatigue.  Remains as active as possible.  Appetite is good. Tolerating mirtazapine  as prescribed.   He is currently staying with his son in [place] and plans to return for dialysis on Friday.  His significant other Caleb Simmons.  He is experiencing issues with his current pain medication, stating that it does not seem to be working as effectively as long as before.  States pain begins to intensify within 3 hours on some days. Taking medication as directed. He received his current prescription on October 06, 2024.  We will continue to closely monitor.  Overall Mr. Kolbe feels his symptoms are well managed. No adjustments at this time. Will continue to support and follow.  Assessment and Plan  Chronic Cancer Related Pain Stable on current regimen. No constipation reported. -Continue Oxycodone  15mg  every 4-6 hours as needed.   Poor Appetite Fluctuating appetite with some improvement.  -Attempt to refill Marinol  5mg  prescription. Remains on backorder.  -Continue  Mirtazapine  at bedtime.   Insomnia Persistent difficulty sleeping. -Continue current regimen which includes Mirtazapine  30mg  at bedtime.   Anxiety Managed with Xanax . -Continue Xanax  0.25mg  as needed.  Dialysis Ongoing, twice weekly. -Continue current schedule.  Nausea Prescription for Compazine  sent by Dr. Lanny.   Follow-up in approximately six weeks by phone, and in-person in three months.  Patient expressed understanding and was in agreement with this plan. He also understands that He can call the clinic at any time with any questions, concerns, or complaints.     Any controlled substances utilized were prescribed in the context of palliative care. PDMP has been reviewed.    I personally spent a total of 30 minutes in the care of the patient today including preparing to see the patient, getting/reviewing separately obtained history, counseling and educating, placing orders, documenting clinical information in the EHR, independently interpreting results, and coordinating care.   Visit consisted of counseling and education dealing with the complex and emotionally intense  issues of symptom management and palliative care in the setting of serious and potentially life-threatening illness.  Levon Borer, AGPCNP-BC  Palliative Medicine Team/[facility]

## 2024-10-29 ENCOUNTER — Other Ambulatory Visit: Payer: Self-pay

## 2024-11-01 ENCOUNTER — Other Ambulatory Visit: Payer: Self-pay | Admitting: Nurse Practitioner

## 2024-11-01 DIAGNOSIS — F419 Anxiety disorder, unspecified: Secondary | ICD-10-CM

## 2024-11-01 DIAGNOSIS — G893 Neoplasm related pain (acute) (chronic): Secondary | ICD-10-CM

## 2024-11-01 DIAGNOSIS — Z515 Encounter for palliative care: Secondary | ICD-10-CM

## 2024-11-01 DIAGNOSIS — E854 Organ-limited amyloidosis: Secondary | ICD-10-CM

## 2024-11-01 DIAGNOSIS — E8581 Light chain (AL) amyloidosis: Secondary | ICD-10-CM

## 2024-11-02 ENCOUNTER — Inpatient Hospital Stay

## 2024-11-04 ENCOUNTER — Telehealth: Payer: Self-pay

## 2024-11-04 ENCOUNTER — Other Ambulatory Visit: Payer: Self-pay | Admitting: Hematology

## 2024-11-04 DIAGNOSIS — Z515 Encounter for palliative care: Secondary | ICD-10-CM

## 2024-11-04 NOTE — Telephone Encounter (Signed)
 Pt SO, Caleb Simmons, called asking for a derm referral d/t an ongoing wound on pt leg and other areas of concer per nancy. Reports that is has been ongoing for weeks now without improvement.per oncology team wound is not likely related to cancer, recommendation made to have dialysis provider assess at next dialysis appt (which was reported to be tomorrow 11/14). Derm referral also made. Pt/SO encouraged to have dialysis provider or urgent care look at wound to assess rather than waiting on a derm referral. Caleb Simmons verbalized understanding and agreement.

## 2024-11-05 ENCOUNTER — Ambulatory Visit (HOSPITAL_COMMUNITY)
Admission: EM | Admit: 2024-11-05 | Discharge: 2024-11-05 | Disposition: A | Discharge status: Home / Self Care | Attending: Emergency Medicine | Admitting: Emergency Medicine

## 2024-11-05 ENCOUNTER — Encounter (HOSPITAL_COMMUNITY): Payer: Self-pay

## 2024-11-05 DIAGNOSIS — S81801A Unspecified open wound, right lower leg, initial encounter: Secondary | ICD-10-CM

## 2024-11-05 DIAGNOSIS — L0100 Impetigo, unspecified: Secondary | ICD-10-CM | POA: Diagnosis not present

## 2024-11-05 MED ORDER — DOXYCYCLINE HYCLATE 100 MG PO CAPS: 100.0000 mg | ORAL_CAPSULE | Freq: Two times a day (BID) | ORAL | 0 refills | Status: AC

## 2024-11-05 MED ORDER — MUPIROCIN 2 % EX OINT: 1.0000 | TOPICAL_OINTMENT | Freq: Two times a day (BID) | CUTANEOUS | 0 refills | Status: DC

## 2024-11-05 MED ORDER — MUPIROCIN 2 % EX OINT: 1.0000 | TOPICAL_OINTMENT | Freq: Two times a day (BID) | CUTANEOUS | 0 refills | Status: AC

## 2024-11-05 MED ORDER — DOXYCYCLINE HYCLATE 100 MG PO CAPS: 100.0000 mg | ORAL_CAPSULE | Freq: Two times a day (BID) | ORAL | 0 refills | Status: DC

## 2024-11-05 NOTE — ED Triage Notes (Addendum)
 Ongoing wound on the right leg thigh area that is on and off painful and bloody. Chronic wound for months. Current flare for the last few weeks. States has gotten bigger and opened up.   Patient also presenting with left lower lip pain and swelling on and off for years. Also a chronic wound that will get worse off and on.   Patient tried cortisone cream with slight relief on the wounds.

## 2024-11-05 NOTE — ED Provider Notes (Signed)
 [facility]    CSN: [id number] Arrival date & time: 11/05/24  1554     History   Chief Complaint Chief Complaint  Patient presents with   Wound Check   Leg Pain   Mouth Lesions    HPI Caleb Simmons is a 73 y.o. male.  Accompanied by family member  Presents with wound on the right upper leg present for several months, maybe years. Over last few weeks it got bigger and started draining blood and pus. Denies any pain.   Additionally irritation on the chin for several months. On and off discomfort and drainage. It seemed to worsen the last several days. His oncologist recommended trying hydrocortisone  topically  ESRD on dialysis. AL amyloidosis   No fever or chills Does have intermittent nausea, especially after chemo. No vomiting   Past Medical History:  Diagnosis Date   Cancer [facility])     Patient Active Problem List   Diagnosis Date Noted   Medication management 11/18/2023   Bloodstream infection due to central venous catheter 10/29/2023   Bacteremia associated with intravascular line 10/27/2023   Chronic heart failure with preserved ejection fraction (HFpEF) (HCC) 10/25/2023   Anxiety 10/25/2023   Cerebrovascular disease 10/25/2023   Hypomagnesemia 10/25/2023   Hyponatremia 10/25/2023   Suspected sepsis, unclear source 10/24/2023   Acute anemia on anemia of ESRD 10/24/2023   Hyperlipidemia 08/13/2022   Palpitations 01/22/2022   ICAO (internal carotid artery occlusion), left 01/18/2022   Tobacco abuse 01/15/2022   Anemia due to chronic kidney disease 01/15/2022   PAD (peripheral artery disease) 01/15/2022   Acute ischemic left MCA stroke (HCC) 01/14/2022   ESRD (end stage renal disease) (HCC) 01/14/2022   BPH (benign prostatic hyperplasia) 01/14/2022   Port-A-Cath in place 11/29/2021   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   Malnutrition of moderate degree 09/13/2021   MSSA bacteremia 09/10/2021   AL amyloidosis (HCC)    Amyloid kidney (HCC)    AKI  (acute kidney injury) 08/23/2021    Past Surgical History:  Procedure Laterality Date   APPENDECTOMY     IR ANGIO INTRA EXTRACRAN SEL COM CAROTID INNOMINATE BILAT MOD SED  01/16/2022   IR ANGIO INTRA EXTRACRAN SEL COM CAROTID INNOMINATE BILAT MOD SED  07/16/2022   IR ANGIO VERTEBRAL SEL SUBCLAVIAN INNOMINATE BILAT MOD SED  01/16/2022   IR ANGIO VERTEBRAL SEL SUBCLAVIAN INNOMINATE UNI R MOD SED  07/16/2022   IR ANGIO VERTEBRAL SEL VERTEBRAL UNI L MOD SED  07/16/2022   IR CT HEAD LTD  01/18/2022   IR EMBO ART  VEN HEMORR LYMPH EXTRAV  INC GUIDE ROADMAPPING  08/30/2021   IR FLUORO GUIDE CV LINE RIGHT  08/30/2021   IR FLUORO GUIDE CV LINE RIGHT  09/18/2021   IR FLUORO GUIDE CV LINE RIGHT  05/02/2022   IR FLUORO GUIDE CV LINE RIGHT  10/31/2023   IR PERCUTANEOUS ART THROMBECTOMY/INFUSION INTRACRANIAL INC DIAG ANGIO  01/18/2022   IR RADIOLOGIST EVAL & MGMT  08/02/2022   IR REMOVAL TUN CV CATH W/O FL  10/28/2023   IR RENAL SELECTIVE  UNI INC S&I MOD SED  08/31/2021   IR US  GUIDE VASC ACCESS RIGHT  08/30/2021   IR US  GUIDE VASC ACCESS RIGHT  08/30/2021   IR US  GUIDE VASC ACCESS RIGHT  09/18/2021   IR US  GUIDE VASC ACCESS RIGHT  01/16/2022   IR US  GUIDE VASC ACCESS RIGHT  07/16/2022   IR US  GUIDE VASC ACCESS RIGHT  10/31/2023   IR VENOCAVAGRAM SVC  05/02/2022   RADIOLOGY WITH ANESTHESIA N/A 01/18/2022   Procedure: Cerebral angioplasty with possible stenting;  Surgeon: [name] Carrion, MD;  Location: [facility];  Service: Radiology;  Laterality: N/A;   TRANSESOPHAGEAL ECHOCARDIOGRAM (CATH LAB) N/A 10/30/2023   Procedure: TRANSESOPHAGEAL ECHOCARDIOGRAM;  Surgeon: Michele Richardson, DO;  Location: [facility];  Service: Cardiovascular;  Laterality: N/A;       Home Medications    Prior to Admission medications   Medication Sig Start Date End Date Taking? Authorizing Provider  acetaminophen  (TYLENOL ) 500 MG tablet Take 1,000 mg by mouth every 6 (six) hours as needed for moderate pain.   Yes [provider]   ALPRAZolam  (XANAX ) 1 MG tablet TAKE 1 TABLET BY MOUTH 3 TIMES DAILY AS NEEDED FOR ANXIETY/SLEEP 11/01/24  Yes Pickenpack-Cousar, Fannie SAILOR, NP  aspirin  81 MG EC tablet Take 1 tablet (81 mg total) by mouth daily. Swallow whole. 01/20/22  Yes Gonfa, Taye T, MD  dronabinol  (MARINOL ) 5 MG capsule Take 1 capsule (5 mg total) by mouth 2 (two) times daily before a meal. 08/05/24  Yes Pickenpack-Cousar, Fannie SAILOR, NP  fluticasone  (FLONASE ) 50 MCG/ACT nasal spray Place 1 spray into both nostrils daily. 04/26/24  Yes Pickenpack-Cousar, Fannie SAILOR, NP  folic acid  (FOLVITE ) 1 MG tablet TAKE 2 TABLETS BY MOUTH EVERY DAY 10/07/24  Yes Lanny Callander, MD  metoprolol  succinate (TOPROL -XL) 25 MG 24 hr tablet TAKE 1/2 TABLET BY MOUTH EVERY DAY 05/24/24  Yes Rolan Ezra RAMAN, MD  mirtazapine  (REMERON ) 30 MG tablet TAKE 1 TABLET BY MOUTH AT BEDTIME 10/07/24  Yes Pickenpack-Cousar, Athena N, NP  Nutritional Supplements (FEEDING SUPPLEMENT, NEPRO CARB STEADY,) LIQD Take 237 mLs by mouth 2 (two) times daily between meals. 10/26/23  Yes Danford, Lonni SQUIBB, MD  oxyCODONE  (ROXICODONE ) 15 MG immediate release tablet TAKE 1 TABLET BY MOUTH EVERY 4 HOURS AS NEEDED 11/01/24  Yes Pickenpack-Cousar, Athena N, NP  pantoprazole  (PROTONIX ) 40 MG tablet TAKE 1 TABLET BY MOUTH 2 TIMES DAILY BEFORE MEALS 11/04/24  Yes Lanny Callander, MD  prochlorperazine  (COMPAZINE ) 5 MG tablet Take 1 tablet (5 mg total) by mouth every 8 (eight) hours as needed for nausea or vomiting. 09/08/24  Yes Pickenpack-Cousar, Fannie SAILOR, NP  tamsulosin  (FLOMAX ) 0.4 MG CAPS capsule TAKE 1 CAPSULE BY MOUTH EVERY DAY 11/04/24  Yes Lanny Callander, MD  zolpidem  (AMBIEN ) 5 MG tablet TAKE 1 TABLET BY MOUTH AT BEDTIME AS NEEDED FOR SLEEP 09/13/24  Yes Pickenpack-Cousar, Athena N, NP  doxycycline (VIBRAMYCIN) 100 MG capsule Take 1 capsule (100 mg total) by mouth 2 (two) times daily for 10 days. 11/05/24 11/15/24  Teighan Aubert, Asberry, PA-C  mupirocin  ointment (BACTROBAN ) 2 % Apply 1 Application  topically 2 (two) times daily. 11/05/24   [place] Festa, Asberry RIGGERS    Family History History reviewed. No pertinent family history.  Social History Social History   Tobacco Use   Smoking status: Some Days    Current packs/day: 0.25    Average packs/day: 0.3 packs/day for 25.0 years (6.3 ttl pk-yrs)    Types: Cigarettes   Smokeless tobacco: Never     Allergies   Iron sucrose   Review of Systems Review of Systems  As per HPI  Physical Exam Triage Vital Signs ED Triage Vitals  Encounter Vitals Group     BP 11/05/24 1801 127/79     Girls Systolic BP Percentile --      Girls Diastolic BP Percentile --      Boys Systolic BP Percentile --  Boys Diastolic BP Percentile --      Pulse Rate 11/05/24 1801 67     Resp 11/05/24 1801 18     Temp 11/05/24 1801 98.1 F (36.7 C)     Temp Source 11/05/24 1801 Oral     SpO2 11/05/24 1801 94 %     Weight --      Height 11/05/24 1801 5' 10 (1.778 m)     Head Circumference --      Peak Flow --      Pain Score 11/05/24 1759 3     Pain Loc --      Pain Education --      Exclude from Growth Chart --    No data found.  Updated Vital Signs BP 127/79 (BP Location: Left Arm)   Pulse 67   Temp 98.1 F (36.7 C) (Oral)   Resp 18   Ht 5' 10 (1.778 m)   SpO2 94%   BMI 22.44 kg/m    Physical Exam Vitals and nursing note reviewed.  Constitutional:      General: He is not in acute distress. HENT:     Head:      Comments: Left chin with macerated area of skin, a few vesicular lesions and yellow drainage, crusting, erythema.     Mouth/Throat:     Pharynx: Oropharynx is clear.  Cardiovascular:     Rate and Rhythm: Normal rate and regular rhythm.     Heart sounds: Normal heart sounds.  Pulmonary:     Effort: Pulmonary effort is normal.     Breath sounds: Normal breath sounds.  Skin:    General: Skin is warm and dry.     Capillary Refill: Capillary refill takes less than 2 seconds.     Findings: Erythema, rash and wound  present.         Comments: Circular nodule on the right thigh, laterally. This has been present itself for months. There is light bleeding with firm pressure. Surrounding nodule has yellow pus drainage.   Neurological:     Mental Status: He is alert and oriented to person, place, and time.     UC Treatments / Results  Labs (all labs ordered are listed, but only abnormal results are displayed) Labs Reviewed - No data to display  EKG   Radiology No results found.  Procedures Procedures (including critical care time)  Medications Ordered in UC Medications - No data to display  Initial Impression / Assessment and Plan / UC Course  I have reviewed the triage vital signs and the nursing notes.  Pertinent labs & imaging results that were available during my care of the patient were reviewed by me and considered in my medical decision making (see chart for details).  Chronic wound of right upper leg, with new infection Infection on the chin appears consistent with impetigo, or similar bacterial infection. Doxycycline BID x 10 days to cover for both areas. Discussed this does not need any special dosing with his dialysis, he can take every 12 hours as directed. Additionally using bactroban  BID on the chin.  Monitor symptoms, return with any worsening infection Advised reasons to be evaluated in the emergency department.  Patient and family member are agreeable to plan. Questions answered    He also has a dermatology referral placed by oncology   Final Clinical Impressions(s) / UC Diagnoses   Final diagnoses:  Wound of right lower extremity, initial encounter  Impetigo     Discharge Instructions  Doxycycline -- antibiotic twice daily, for 10 days in a row. Please always eat before taking the medicine. You do not need to change this dosing with dialysis -- just keep taking it every 12 hours until gone!  Bactroban  ointment can be applied to the face twice a day.  I also  recommend to keep both infected areas clean, make sure to wash gently with soap and water.  If at any point it seems infection is worsening please return. If you develop any fever, chills, headache, abdominal pain, vomiting or inability to tolerate fluids, please go to the emergency department    ED Prescriptions     Medication Sig Dispense Auth. Provider   doxycycline (VIBRAMYCIN) 100 MG capsule  (Status: Discontinued) Take 1 capsule (100 mg total) by mouth 2 (two) times daily for 10 days. 20 capsule Greidy Sherard, PA-C   mupirocin  ointment (BACTROBAN ) 2 %  (Status: Discontinued) Apply 1 Application topically 2 (two) times daily. 22 each Lavel Rieman, PA-C   mupirocin  ointment (BACTROBAN ) 2 %  (Status: Discontinued) Apply 1 Application topically 2 (two) times daily. 15 g Deshanda Molitor, PA-C   doxycycline (VIBRAMYCIN) 100 MG capsule Take 1 capsule (100 mg total) by mouth 2 (two) times daily for 10 days. 20 capsule Juelle Dickmann, PA-C   mupirocin  ointment (BACTROBAN ) 2 % Apply 1 Application topically 2 (two) times daily. 15 g Jamieson Lisa, Asberry, PA-C      PDMP not reviewed this encounter.   Jeryl Asberry RIGGERS 11/05/24 2105

## 2024-11-05 NOTE — Discharge Instructions (Addendum)
 Doxycycline -- antibiotic twice daily, for 10 days in a row. Please always eat before taking the medicine. You do not need to change this dosing with dialysis -- just keep taking it every 12 hours until gone!  Bactroban  ointment can be applied to the face twice a day.  I also recommend to keep both infected areas clean, make sure to wash gently with soap and water.  If at any point it seems infection is worsening please return. If you develop any fever, chills, headache, abdominal pain, vomiting or inability to tolerate fluids, please go to the emergency department

## 2024-11-24 ENCOUNTER — Other Ambulatory Visit: Payer: Self-pay | Admitting: Hematology

## 2024-11-24 ENCOUNTER — Other Ambulatory Visit: Payer: Self-pay | Admitting: Nurse Practitioner

## 2024-11-24 DIAGNOSIS — E8581 Light chain (AL) amyloidosis: Secondary | ICD-10-CM

## 2024-11-24 NOTE — Assessment & Plan Note (Signed)
-  lamda light chain disease with renal, cardiac and neuro involvement   -diagnosed in 08/2021 -not a candidate for transplant  --He began weekly oral Cytoxan , dexa and Velcade  injection while inpatient on 09/10/21.  He tolerated well. -He began daratumumab  injection 09/27/21. Chemo was held briefly following stroke 01/14/22. -not a candidate for biphosphonate due to renal failure -he completed 6 cycle induction chemo with CyBorD on 03/21/22 and moved to monthly Dara maintenance on 03/28/22. He completed a total of 2 year therapy in 08/2023 -He has had very good partial response to treatment based on his M protein and light chain level. -we previously discussed options of treatment for relapsed disease  -continue surveillance.

## 2024-11-24 NOTE — Progress Notes (Unsigned)
 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  He tolerated well. -He began daratumumab  injection 09/27/21. Chemo was held briefly following stroke 01/14/22. -not a candidate for biphosphonate due to renal failure -he completed 6 cycle induction chemo with CyBorD on 03/21/22 and moved to monthly Dara maintenance on 03/28/22. He completed a total of 2 year therapy in 08/2023 -He has had very good partial response to treatment based on his M protein and light chain level. -we previously discussed options of treatment for relapsed disease  -11/25/2024 - exam is overall benign. He continues with hemodialysis on Mondays and Fridays.  -continue surveillance.     Anemia  Mild and stable. Hgb 9.4 and Hct 29.0 today. His ferritin is elevated at 446. No requirement for blood transfusion or iron infusion. This is likely, at least, partially, related to chronic kidney disease. Will continue to monitor with every visit.   End stage CKD Today, BUN 39, creatinine 5.54, eGFR 10. The patient is taking hemodialysis twice weekly. He has a Monday, Friday dialysis schedule.   AL amyloidosis Most recent free light chains and myeloma panels have been slightly increased. Panels were drawn with today's labs. Will contact patient and son with MyChart message to advise them of results.   Plan  Labs reviewed.  -mild and stable anemia.  -end stage CKD. -light chains and myeloma  panels are pending.  Patient with no new concerns or symptoms. Exam is benign.  Plan for labs and follow up in 3 months, sooner if needed.   The patient understands the plans discussed today and is in agreement with them.  He knows to contact our office if he develops concerns prior to his next appointment.  I provided 20 minutes of face-to-face time during this encounter and > 50% was spent counseling as documented under my assessment and plan.    Caleb FORBES Lessen, Caleb Simmons  [facility] - A DEPT OF [facility] [address] [place] [address] Dept: [phone] Dept Fax: [phone]   No orders of the defined types were placed in this encounter.     CHIEF COMPLAINT:  CC: AL amyloidosis  Current Treatment: Observation  INTERVAL HISTORY:  Caleb Simmons is here today for repeat clinical assessment. He last saw Dr. Lanny on 08/26/2024. He continues to see palliative care Caleb Simmons, Nikki. He has well managed pain. He does have reduced appetite. Takes marinol  to help stimulate his appetite. He reports his appetite is unchanged. He has had a 5 pound weight loss since his last visit. He denies chest pain, chest pressure, or shortness of breath. He denies headaches or visual disturbances. He denies abdominal pain, nausea, vomiting, or changes in bowel or bladder habits. He denies fevers or chills.   I have reviewed the past medical history, past surgical history, social history and family history with the patient and they are unchanged from previous note.  ALLERGIES:  is allergic to iron sucrose.  MEDICATIONS:  Current Outpatient Medications  Medication Sig Dispense Refill   acetaminophen  (TYLENOL ) 500 MG tablet Take 1,000 mg by mouth every 6 (six) hours as needed for moderate pain.     aspirin  81 MG EC tablet Take 1 tablet (81 mg total) by mouth daily. Swallow whole. 30 tablet 11   dronabinol  (MARINOL ) 5 MG capsule Take 1 capsule (5 mg  total) by mouth 2 (two) times daily before a meal. 60 capsule 2   fluticasone  (FLONASE ) 50 MCG/ACT nasal spray Place 1 spray into both nostrils daily. 16 g 5   folic acid  (FOLVITE ) 1 MG tablet TAKE 2 TABLETS BY MOUTH EVERY DAY 60 tablet 0   metoprolol  succinate (TOPROL -XL) 25 MG 24 hr tablet TAKE 1/2 TABLET BY MOUTH EVERY DAY 45 tablet 3   mirtazapine  (REMERON ) 30 MG tablet TAKE 1 TABLET BY MOUTH AT BEDTIME 30 tablet 1   mupirocin  ointment (BACTROBAN ) 2 % Apply 1 Application topically 2 (two) times daily. 15 g 0   Nutritional Supplements (FEEDING SUPPLEMENT, NEPRO CARB STEADY,) LIQD Take 237 mLs by mouth 2 (two) times daily between meals.     pantoprazole  (PROTONIX ) 40 MG tablet TAKE 1 TABLET BY MOUTH 2 TIMES DAILY BEFORE MEALS 60 tablet 0   prochlorperazine  (COMPAZINE ) 5 MG tablet Take 1 tablet (5 mg total) by mouth every 8 (eight) hours as needed for nausea or vomiting. 90 tablet 4   tamsulosin  (FLOMAX ) 0.4 MG CAPS capsule TAKE 1 CAPSULE BY MOUTH EVERY DAY 30 capsule 0   zolpidem  (AMBIEN ) 5 MG tablet TAKE 1 TABLET BY MOUTH AT BEDTIME AS NEEDED FOR SLEEP 30 tablet 3   [START ON 12/02/2024] ALPRAZolam  (XANAX ) 1 MG tablet Take 1 tablet (1 mg total) by mouth 3 (three) times daily as needed for anxiety. 90 tablet 0   [START ON 11/26/2024] oxyCODONE  (ROXICODONE ) 15 MG immediate release tablet Take 1 tablet (15 mg total) by mouth every 4 (four) hours as needed. 90 tablet 0   No current facility-administered medications for this visit.   Facility-Administered Medications Ordered in Other Visits  Medication Dose Route Frequency Provider Last Rate Last Admin   0.9 %  sodium chloride  infusion (Manually program via Guardrails IV Fluids)  250 mL Intravenous Once Lanny Callander, MD       heparin  lock flush 100 unit/mL  500 Units Intracatheter Once Lanny Callander, MD       sodium chloride  flush (NS) 0.9 % injection 10 mL  10 mL Intracatheter Once Lanny Callander, MD            REVIEW OF SYSTEMS:   Constitutional:  Denies fevers or chills.  Has small appetite.  Has had a 5 pound weight loss since last visit. Eyes: Denies blurriness of vision Ears, nose, mouth, throat, and face: Denies mucositis or sore throat Respiratory: Denies cough, dyspnea or wheezes Cardiovascular: Denies palpitation, chest discomfort or lower extremity swelling Gastrointestinal:  Denies nausea, heartburn or change in bowel habits Skin: Denies abnormal skin rashes Lymphatics: Denies new lymphadenopathy or easy bruising Neurological:Denies numbness, tingling or new weaknesses Behavioral/Psych: Mood is stable, no new changes  All other systems were reviewed with the patient and are negative.   VITALS:   Today's Vitals   11/25/24 0936 11/25/24 1014  BP: 128/70   Pulse: 66   Resp: 17   Temp: 97.8 F (36.6 C)   SpO2: 99%   Weight: 150 lb 4.8 oz (68.2 kg)   PainSc:  0-No pain   Body mass index  is 21.57 kg/m.   Wt Readings from Last 3 Encounters:  11/25/24 150 lb 4.8 oz (68.2 kg)  08/26/24 156 lb 6.4 oz (70.9 kg)  05/27/24 159 lb 4.8 oz (72.3 kg)    Body mass index is 21.57 kg/m.  Performance status (ECOG): 2 - Symptomatic, <50% confined to bed  PHYSICAL EXAM:   GENERAL:alert, no distress and comfortable SKIN: skin color, texture, turgor are normal, no rashes or significant lesions EYES: normal, Conjunctiva are pink and non-injected, sclera clear OROPHARYNX:no exudate, no erythema and lips, buccal mucosa, and tongue normal  NECK: supple, thyroid normal size, non-tender, without nodularity LYMPH:  no palpable lymphadenopathy in the cervical, axillary or inguinal LUNGS: Mild extremity wheezing heard in bilateral lung bases. HEART: regular rate & rhythm and no murmurs and no lower extremity edema ABDOMEN:abdomen soft, non-tender and normal bowel sounds Musculoskeletal:no cyanosis of digits and no clubbing  NEURO: alert & oriented x 3 with fluent speech, no focal motor/sensory deficits  LABORATORY DATA:  I have  reviewed the data as listed    Component Value Date/Time   NA 139 11/25/2024 0852   K 4.2 11/25/2024 0852   CL 104 11/25/2024 0852   CO2 23 11/25/2024 0852   GLUCOSE 97 11/25/2024 0852   BUN 39 (H) 11/25/2024 0852   CREATININE 5.54 (H) 11/25/2024 0852   CALCIUM  8.8 (L) 11/25/2024 0852   PROT 6.3 (L) 11/25/2024 0852   ALBUMIN  4.0 11/25/2024 0852   AST 12 (L) 11/25/2024 0852   ALT 5 11/25/2024 0852   ALKPHOS 84 11/25/2024 0852   BILITOT 0.3 11/25/2024 0852   GFRNONAA 10 (L) 11/25/2024 0852    Lab Results  Component Value Date   WBC 7.9 11/25/2024   NEUTROABS 5.0 11/25/2024   HGB 9.4 (L) 11/25/2024   HCT 29.0 (L) 11/25/2024   MCV 87.6 11/25/2024   PLT 137 (L) 11/25/2024

## 2024-11-25 ENCOUNTER — Encounter: Payer: Self-pay | Admitting: Nurse Practitioner

## 2024-11-25 ENCOUNTER — Inpatient Hospital Stay (HOSPITAL_BASED_OUTPATIENT_CLINIC_OR_DEPARTMENT_OTHER): Admitting: Nurse Practitioner

## 2024-11-25 ENCOUNTER — Inpatient Hospital Stay: Attending: Hematology

## 2024-11-25 ENCOUNTER — Inpatient Hospital Stay: Admitting: Nurse Practitioner

## 2024-11-25 ENCOUNTER — Encounter: Payer: Self-pay | Admitting: Hematology

## 2024-11-25 ENCOUNTER — Ambulatory Visit: Admitting: Hematology

## 2024-11-25 VITALS — BP 128/70 | HR 66 | Temp 97.8°F | Resp 17 | Wt 150.3 lb

## 2024-11-25 DIAGNOSIS — G4700 Insomnia, unspecified: Secondary | ICD-10-CM | POA: Diagnosis not present

## 2024-11-25 DIAGNOSIS — Z515 Encounter for palliative care: Secondary | ICD-10-CM | POA: Diagnosis not present

## 2024-11-25 DIAGNOSIS — D631 Anemia in chronic kidney disease: Secondary | ICD-10-CM | POA: Insufficient documentation

## 2024-11-25 DIAGNOSIS — R110 Nausea: Secondary | ICD-10-CM | POA: Diagnosis not present

## 2024-11-25 DIAGNOSIS — G893 Neoplasm related pain (acute) (chronic): Secondary | ICD-10-CM | POA: Diagnosis not present

## 2024-11-25 DIAGNOSIS — Z992 Dependence on renal dialysis: Secondary | ICD-10-CM

## 2024-11-25 DIAGNOSIS — R530 Neoplastic (malignant) related fatigue: Secondary | ICD-10-CM

## 2024-11-25 DIAGNOSIS — F17210 Nicotine dependence, cigarettes, uncomplicated: Secondary | ICD-10-CM

## 2024-11-25 DIAGNOSIS — E8581 Light chain (AL) amyloidosis: Secondary | ICD-10-CM | POA: Diagnosis not present

## 2024-11-25 DIAGNOSIS — R630 Anorexia: Secondary | ICD-10-CM

## 2024-11-25 DIAGNOSIS — N186 End stage renal disease: Secondary | ICD-10-CM | POA: Insufficient documentation

## 2024-11-25 DIAGNOSIS — I43 Cardiomyopathy in diseases classified elsewhere: Secondary | ICD-10-CM

## 2024-11-25 DIAGNOSIS — F419 Anxiety disorder, unspecified: Secondary | ICD-10-CM

## 2024-11-25 LAB — CBC WITH DIFFERENTIAL (CANCER CENTER ONLY)
Abs Immature Granulocytes: 0.05 K/uL (ref 0.00–0.07)
Basophils Absolute: 0.1 K/uL (ref 0.0–0.1)
Basophils Relative: 1 %
Eosinophils Absolute: 0.2 K/uL (ref 0.0–0.5)
Eosinophils Relative: 2 %
HCT: 29 % — ABNORMAL LOW (ref 39.0–52.0)
Hemoglobin: 9.4 g/dL — ABNORMAL LOW (ref 13.0–17.0)
Immature Granulocytes: 1 %
Lymphocytes Relative: 28 %
Lymphs Abs: 2.2 K/uL (ref 0.7–4.0)
MCH: 28.4 pg (ref 26.0–34.0)
MCHC: 32.4 g/dL (ref 30.0–36.0)
MCV: 87.6 fL (ref 80.0–100.0)
Monocytes Absolute: 0.4 K/uL (ref 0.1–1.0)
Monocytes Relative: 5 %
Neutro Abs: 5 K/uL (ref 1.7–7.7)
Neutrophils Relative %: 63 %
Platelet Count: 137 K/uL — ABNORMAL LOW (ref 150–400)
RBC: 3.31 MIL/uL — ABNORMAL LOW (ref 4.22–5.81)
RDW: 15.9 % — ABNORMAL HIGH (ref 11.5–15.5)
WBC Count: 7.9 K/uL (ref 4.0–10.5)
nRBC: 0 % (ref 0.0–0.2)

## 2024-11-25 LAB — CMP (CANCER CENTER ONLY)
ALT: 5 U/L (ref 0–44)
AST: 12 U/L — ABNORMAL LOW (ref 15–41)
Albumin: 4 g/dL (ref 3.5–5.0)
Alkaline Phosphatase: 84 U/L (ref 38–126)
Anion gap: 13 (ref 5–15)
BUN: 39 mg/dL — ABNORMAL HIGH (ref 8–23)
CO2: 23 mmol/L (ref 22–32)
Calcium: 8.8 mg/dL — ABNORMAL LOW (ref 8.9–10.3)
Chloride: 104 mmol/L (ref 98–111)
Creatinine: 5.54 mg/dL — ABNORMAL HIGH (ref 0.61–1.24)
GFR, Estimated: 10 mL/min — ABNORMAL LOW (ref >60)
Glucose, Bld: 97 mg/dL (ref 70–99)
Potassium: 4.2 mmol/L (ref 3.5–5.1)
Sodium: 139 mmol/L (ref 135–145)
Total Bilirubin: 0.3 mg/dL (ref 0.0–1.2)
Total Protein: 6.3 g/dL — ABNORMAL LOW (ref 6.5–8.1)

## 2024-11-25 LAB — FERRITIN: Ferritin: 446 ng/mL — ABNORMAL HIGH (ref 24–336)

## 2024-11-25 MED ORDER — ALPRAZOLAM 1 MG PO TABS: 1.0000 mg | ORAL_TABLET | Freq: Three times a day (TID) | ORAL | 0 refills | Status: DC | PRN

## 2024-11-25 MED ORDER — OXYCODONE HCL 15 MG PO TABS: 15.0000 mg | ORAL_TABLET | ORAL | 0 refills | Status: DC | PRN

## 2024-11-25 NOTE — Progress Notes (Signed)
 Palliative Medicine [facility]  [phone] [phone]) [phone]   Name: Caleb Simmons Date: 11/25/2024 MRN: [id number]  DOB: June 06, 1951  Patient Care Team: Lanny Callander, MD as PCP - General (Hematology) Buntin, Chiquita LITTIE, RN (Inactive) as Registered Nurse Pickenpack-Cousar, Fannie SAILOR, NP as Nurse Practitioner (Nurse Practitioner) Lanny Callander, MD as Attending Physician (Hematology and Oncology)   INTERVAL HISTORY: Caleb Simmons is a 73 y.o. 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  Recently admitted and discharged on 01/20/22 after receiving treatment for left MCA CVA. Palliative following for ongoing goals of care discussions and symptom management.   SOCIAL HISTORY:     reports that he has been smoking cigarettes. He has a 6.3 pack-year smoking history. He has never used smokeless tobacco.  ADVANCE DIRECTIVES:  Patient reports completed document.  Son Kartik, Fernando. is his healthcare power of attorney.  CODE STATUS: Full code  PAST MEDICAL HISTORY: Past Medical History:  Diagnosis Date   Cancer (HCC)     ALLERGIES:  is allergic to iron sucrose.  MEDICATIONS:  Current Outpatient Medications  Medication Sig Dispense Refill   acetaminophen  (TYLENOL ) 500 MG tablet Take 1,000 mg by mouth every 6 (six) hours as needed for moderate pain.     ALPRAZolam  (XANAX ) 1 MG tablet TAKE 1 TABLET BY MOUTH 3 TIMES DAILY AS NEEDED FOR ANXIETY/SLEEP 90 tablet 0   aspirin  81 MG EC tablet Take 1 tablet (81 mg total) by mouth daily. Swallow whole. 30 tablet 11   dronabinol  (MARINOL ) 5 MG capsule Take 1 capsule (5 mg total) by mouth 2 (two) times daily before a meal. 60 capsule 2   fluticasone  (FLONASE ) 50 MCG/ACT nasal spray Place 1 spray into both nostrils daily. 16 g 5   folic acid  (FOLVITE ) 1 MG tablet TAKE 2 TABLETS BY MOUTH EVERY DAY 60  tablet 0   metoprolol  succinate (TOPROL -XL) 25 MG 24 hr tablet TAKE 1/2 TABLET BY MOUTH EVERY DAY 45 tablet 3   mirtazapine  (REMERON ) 30 MG tablet TAKE 1 TABLET BY MOUTH AT BEDTIME 30 tablet 1   mupirocin  ointment (BACTROBAN ) 2 % Apply 1 Application topically 2 (two) times daily. 15 g 0   Nutritional Supplements (FEEDING SUPPLEMENT, NEPRO CARB STEADY,) LIQD Take 237 mLs by mouth 2 (two) times daily between meals.     oxyCODONE  (ROXICODONE ) 15 MG immediate release tablet TAKE 1 TABLET BY MOUTH EVERY 4 HOURS AS NEEDED 90 tablet 0   pantoprazole  (PROTONIX ) 40 MG tablet TAKE 1 TABLET BY MOUTH 2 TIMES DAILY BEFORE MEALS 60 tablet 0   prochlorperazine  (COMPAZINE ) 5 MG tablet Take 1 tablet (5 mg total) by mouth every 8 (eight) hours as needed for nausea or vomiting. 90 tablet 4   tamsulosin  (FLOMAX ) 0.4 MG CAPS capsule TAKE 1 CAPSULE BY MOUTH EVERY DAY 30 capsule 0   zolpidem  (AMBIEN ) 5 MG tablet TAKE 1 TABLET BY MOUTH AT BEDTIME AS NEEDED FOR SLEEP 30 tablet 3   No current facility-administered medications for this visit.   Facility-Administered Medications Ordered in Other Visits  Medication Dose Route Frequency Provider Last Rate Last Admin   0.9 %  sodium chloride  infusion (Manually program via Guardrails IV Fluids)  250 mL Intravenous Once Lanny Callander, MD       heparin  lock flush 100 unit/mL  500 Units Intracatheter Once Lanny Callander, MD  sodium chloride  flush (NS) 0.9 % injection 10 mL  10 mL Intracatheter Once Lanny Callander, MD        VITAL SIGNS: There were no vitals taken for this visit. There were no vitals filed for this visit.    Estimated body mass index is 21.57 kg/m as calculated from the following:   Height as of 11/05/24: 5' 10 (1.778 m).   Weight as of an earlier encounter on 11/25/24: 150 lb 4.8 oz (68.2 kg).   PERFORMANCE STATUS (ECOG) : 2 - Symptomatic, <50% confined to bed  Discussed the use of AI scribe software for clinical note transcription with the patient, who gave  verbal consent to proceed.   Assessment NAD RRR Normal breathing pattern AAO x3  IMPRESSION: Discussed the use of AI scribe software for clinical note transcription with the patient, who gave verbal consent to proceed.  History of Present Illness Caleb Simmons is a 73 year old male who presents to clinic for  symptom management follow-up. No acute distress. Denies concerns of nausea, vomiting, constipation, or diarrhea.  Occasional fatigue.  Remains as active as possible.  Appetite is good. Tolerating mirtazapine  as prescribed. He is accompanied by his son today.   He is currently undergoing dialysis twice a week and reports that it is going okay. He has no issues with taking his medications.His appetite is okay, eating two meals a day, but he has difficulty sleeping at times.   He has ongoing leg pain and a leg ulceration that has not improved. The ulceration was evaluated by his primary doctor, who referred him to the wound clinic, but he has not yet had the appointment. He is scheduled for December 17th. Pain is managed with oxycodone .   Overall Mr. Negro feels his symptoms are well managed. No adjustments at this time. Will continue to support and follow.  Assessment and Plan  Chronic Cancer Related Pain Stable on current regimen. No constipation reported. -Continue Oxycodone  15mg  every 4-6 hours as needed.   Poor Appetite Fluctuating appetite with some improvement.  -Attempt to refill Marinol  5mg  prescription. Remains on backorder.  -Continue Mirtazapine  at bedtime.   Insomnia Persistent difficulty sleeping. -Continue current regimen which includes Mirtazapine  30mg  at bedtime.   Anxiety Managed with Xanax . -Continue Xanax  0.25mg  as needed.  Dialysis Ongoing, twice weekly. -Continue current schedule.  Nausea Prescription for Compazine  sent by Dr. Lanny.   Follow-up in approximately six weeks by phone, and in-person in three months.  Patient expressed understanding  and was in agreement with this plan. He also understands that He can call the clinic at any time with any questions, concerns, or complaints.     Any controlled substances utilized were prescribed in the context of palliative care. PDMP has been reviewed.    Visit consisted of counseling and education dealing with the complex and emotionally intense issues of symptom management and palliative care in the setting of serious and potentially life-threatening illness.  Levon Borer, AGPCNP-BC  Palliative Medicine Team/[facility]

## 2024-11-26 LAB — KAPPA/LAMBDA LIGHT CHAINS
Kappa free light chain: 56.5 mg/L — ABNORMAL HIGH (ref 3.3–19.4)
Kappa, lambda light chain ratio: 1.97 — ABNORMAL HIGH (ref 0.26–1.65)
Lambda free light chains: 28.7 mg/L — ABNORMAL HIGH (ref 5.7–26.3)

## 2024-11-30 LAB — MULTIPLE MYELOMA PANEL, SERUM
Albumin SerPl Elph-Mcnc: 3.3 g/dL (ref 2.9–4.4)
Albumin/Glob SerPl: 1.4 (ref 0.7–1.7)
Alpha 1: 0.3 g/dL (ref 0.0–0.4)
Alpha2 Glob SerPl Elph-Mcnc: 0.8 g/dL (ref 0.4–1.0)
B-Globulin SerPl Elph-Mcnc: 0.8 g/dL (ref 0.7–1.3)
Gamma Glob SerPl Elph-Mcnc: 0.6 g/dL (ref 0.4–1.8)
Globulin, Total: 2.5 g/dL (ref 2.2–3.9)
IgA: 90 mg/dL (ref 61–437)
IgG (Immunoglobin G), Serum: 569 mg/dL — ABNORMAL LOW (ref 603–1613)
IgM (Immunoglobulin M), Srm: 73 mg/dL (ref 15–143)
Total Protein ELP: 5.8 g/dL — ABNORMAL LOW (ref 6.0–8.5)

## 2024-12-01 ENCOUNTER — Other Ambulatory Visit: Payer: Self-pay | Admitting: Nurse Practitioner

## 2024-12-01 ENCOUNTER — Other Ambulatory Visit: Payer: Self-pay | Admitting: Hematology

## 2024-12-08 ENCOUNTER — Encounter (HOSPITAL_BASED_OUTPATIENT_CLINIC_OR_DEPARTMENT_OTHER): Admitting: Internal Medicine

## 2024-12-20 ENCOUNTER — Other Ambulatory Visit: Payer: Self-pay | Admitting: Nurse Practitioner

## 2024-12-20 DIAGNOSIS — R110 Nausea: Secondary | ICD-10-CM

## 2024-12-20 DIAGNOSIS — Z515 Encounter for palliative care: Secondary | ICD-10-CM

## 2024-12-29 ENCOUNTER — Other Ambulatory Visit: Payer: Self-pay | Admitting: Nurse Practitioner

## 2024-12-29 DIAGNOSIS — G893 Neoplasm related pain (acute) (chronic): Secondary | ICD-10-CM

## 2024-12-29 DIAGNOSIS — E8581 Light chain (AL) amyloidosis: Secondary | ICD-10-CM

## 2024-12-29 DIAGNOSIS — Z515 Encounter for palliative care: Secondary | ICD-10-CM

## 2024-12-29 DIAGNOSIS — I43 Cardiomyopathy in diseases classified elsewhere: Secondary | ICD-10-CM

## 2024-12-29 DIAGNOSIS — F419 Anxiety disorder, unspecified: Secondary | ICD-10-CM

## 2024-12-30 ENCOUNTER — Other Ambulatory Visit: Payer: Self-pay | Admitting: Hematology

## 2024-12-30 ENCOUNTER — Other Ambulatory Visit: Payer: Self-pay | Admitting: Nurse Practitioner

## 2024-12-30 DIAGNOSIS — E854 Organ-limited amyloidosis: Secondary | ICD-10-CM

## 2024-12-30 DIAGNOSIS — Z515 Encounter for palliative care: Secondary | ICD-10-CM

## 2024-12-30 DIAGNOSIS — G4700 Insomnia, unspecified: Secondary | ICD-10-CM

## 2024-12-30 DIAGNOSIS — E8581 Light chain (AL) amyloidosis: Secondary | ICD-10-CM

## 2025-01-05 ENCOUNTER — Encounter: Payer: Self-pay | Admitting: Nurse Practitioner

## 2025-01-05 ENCOUNTER — Inpatient Hospital Stay: Attending: Hematology | Admitting: Nurse Practitioner

## 2025-01-05 DIAGNOSIS — Z992 Dependence on renal dialysis: Secondary | ICD-10-CM

## 2025-01-05 DIAGNOSIS — G893 Neoplasm related pain (acute) (chronic): Secondary | ICD-10-CM | POA: Diagnosis not present

## 2025-01-05 DIAGNOSIS — R630 Anorexia: Secondary | ICD-10-CM | POA: Diagnosis not present

## 2025-01-05 DIAGNOSIS — Z515 Encounter for palliative care: Secondary | ICD-10-CM

## 2025-01-05 DIAGNOSIS — E8581 Light chain (AL) amyloidosis: Secondary | ICD-10-CM | POA: Diagnosis not present

## 2025-01-05 DIAGNOSIS — F419 Anxiety disorder, unspecified: Secondary | ICD-10-CM

## 2025-01-05 DIAGNOSIS — N186 End stage renal disease: Secondary | ICD-10-CM | POA: Diagnosis not present

## 2025-01-05 DIAGNOSIS — F17210 Nicotine dependence, cigarettes, uncomplicated: Secondary | ICD-10-CM | POA: Diagnosis not present

## 2025-01-05 DIAGNOSIS — R530 Neoplastic (malignant) related fatigue: Secondary | ICD-10-CM

## 2025-01-05 DIAGNOSIS — G4700 Insomnia, unspecified: Secondary | ICD-10-CM

## 2025-01-05 DIAGNOSIS — C9000 Multiple myeloma not having achieved remission: Secondary | ICD-10-CM

## 2025-01-05 DIAGNOSIS — G4709 Other insomnia: Secondary | ICD-10-CM

## 2025-01-05 NOTE — Progress Notes (Signed)
 "    Palliative Medicine [facility]  Telephone:([phone] [phone]) [phone]   Name: Caleb Simmons Date: 01/05/2025 MRN: [id number]  DOB: 1951/03/20  Patient Care Team: Lanny Callander, MD as PCP - General (Hematology) Buntin, Chiquita LITTIE, RN (Inactive) as Registered Nurse Pickenpack-Cousar, Fannie SAILOR, NP as Nurse Practitioner (Nurse Practitioner) Lanny Callander, MD as Attending Physician (Hematology and Oncology)   I connected with Caleb Simmons on 01/05/2025 at  3:45 PM EST by telephone and verified that I am speaking with the correct person using two identifiers.   I discussed the limitations, risks, security and privacy concerns of performing an evaluation and management service by telemedicine and the availability of in-person appointments. I also discussed with the patient that there may be a patient responsible charge related to this service. The patient expressed understanding and agreed to proceed.   Other persons participating in the visit and their role in the encounter: N/A   Patients location: [place]   Providers location: [facility]   INTERVAL HISTORY: Caleb Simmons is a 74 y.o. 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  Recently admitted and discharged on 01/20/22 after receiving treatment for left MCA CVA. Palliative following for ongoing goals of care discussions and symptom management.   SOCIAL HISTORY:     reports that he has been smoking cigarettes. He has a 6.3 pack-year smoking history. He has never used smokeless tobacco.  ADVANCE DIRECTIVES:  Patient reports completed document.  [name], Caleb Simmons. is his healthcare power of attorney.  CODE STATUS: Full code  PAST MEDICAL HISTORY: Past Medical History:  Diagnosis Date   Cancer (HCC)     ALLERGIES:  is allergic to iron sucrose.  MEDICATIONS:  Current Outpatient Medications   Medication Sig Dispense Refill   acetaminophen  (TYLENOL ) 500 MG tablet Take 1,000 mg by mouth every 6 (six) hours as needed for moderate pain.     ALPRAZolam  (XANAX ) 1 MG tablet TAKE 1 TABLET BY MOUTH 3 TIMES DAILY AS NEEDED FOR ANXIETY 90 tablet 0   aspirin  81 MG EC tablet Take 1 tablet (81 mg total) by mouth daily. Swallow whole. 30 tablet 11   dronabinol  (MARINOL ) 5 MG capsule Take 1 capsule (5 mg total) by mouth 2 (two) times daily before a meal. 60 capsule 2   fluticasone  (FLONASE ) 50 MCG/ACT nasal spray Place 1 spray into both nostrils daily. 16 g 5   folic acid  (FOLVITE ) 1 MG tablet TAKE 2 TABLETS BY MOUTH EVERY DAY 60 tablet 0   metoprolol  succinate (TOPROL -XL) 25 MG 24 hr tablet TAKE 1/2 TABLET BY MOUTH EVERY DAY 45 tablet 3   mirtazapine  (REMERON ) 30 MG tablet TAKE 1 TABLET BY MOUTH AT BEDTIME 30 tablet 1   mupirocin  ointment (BACTROBAN ) 2 % Apply 1 Application topically 2 (two) times daily. 15 g 0   Nutritional Supplements (FEEDING SUPPLEMENT, NEPRO CARB STEADY,) LIQD Take 237 mLs by mouth 2 (two) times daily between meals.     oxyCODONE  (ROXICODONE ) 15 MG immediate release tablet TAKE 1 TABLET BY MOUTH EVERY 4 HOURS AS NEEDED 90 tablet 0   pantoprazole  (PROTONIX ) 40 MG tablet TAKE 1 TABLET BY MOUTH 2 TIMES DAILY BEFORE MEALS 60 tablet 0   prochlorperazine  (COMPAZINE ) 5 MG tablet TAKE 1 TABLET BY MOUTH EVERY 8 HOURS AS NEEDED FOR NAUSEA AND VOMITING 90 tablet 4   tamsulosin  (FLOMAX ) 0.4 MG CAPS capsule  TAKE 1 CAPSULE BY MOUTH EVERY DAY 30 capsule 0   zolpidem  (AMBIEN ) 5 MG tablet TAKE 1 TABLET BY MOUTH AT BEDTIME AS NEEDED FOR SLEEP 30 tablet 3   No current facility-administered medications for this visit.   Facility-Administered Medications Ordered in Other Visits  Medication Dose Route Frequency Provider Last Rate Last Admin   0.9 %  sodium chloride  infusion (Manually program via Guardrails IV Fluids)  250 mL Intravenous Once Lanny Callander, MD       heparin  lock flush 100 unit/mL   500 Units Intracatheter Once Lanny Callander, MD       sodium chloride  flush (NS) 0.9 % injection 10 mL  10 mL Intracatheter Once Lanny Callander, MD        VITAL SIGNS: There were no vitals taken for this visit. There were no vitals filed for this visit.    Estimated body mass index is 21.57 kg/m as calculated from the following:   Height as of 11/05/24: 5' 10 (1.778 m).   Weight as of 11/25/24: 150 lb 4.8 oz (68.2 kg).   PERFORMANCE STATUS (ECOG) : 2 - Symptomatic, <50% confined to bed  Discussed the use of AI scribe software for clinical note transcription with the patient, who gave verbal consent to proceed.   IMPRESSION: Discussed the use of AI scribe software for clinical note transcription with the patient, who gave verbal consent to proceed.  History of Present Illness Caleb Simmons is a 74 year old male with AL amyloidosis who I connected with by phone for symptom management follow-up. No acute distress.   He continues to tolerate dialysis well for his end-stage renal disease. His appetite remains good, and he experiences only occasional fatigue. Remains as active as possible. No issues with nausea, vomiting, constipation, or diarrhea are reported.  We discussed his pain at length. Caleb Simmons pain is well managed on current regimen which includes oxycodone  15 mg every four hours as needed, mirtazapine  30 mg daily at bedtime for sleep and appetite, and Xanax  1 mg three times a day as needed for anxiety. No adjustments needed at this time.   Overall Caleb Simmons feels his symptoms are well managed. No adjustments at this time. Will continue to support and follow.  Assessment & Plan End stage renal disease on dialysis Continues to tolerate dialysis well with good appetite and occasional fatigue. No concerns with nausea, vomiting, constipation, or diarrhea. - Continue current dialysis regimen.  Chronic cancer related pain Managed with oxycodone . No adjustments at this time.  - Continue  oxycodone  15 mg every four hours as needed.  Insomnia/Decreased appetite  - Continue mirtazapine  30 mg daily at bedtime.  Anxiety disorder Managed with Xanax  as needed. - Continue Xanax  1 mg three times a day as needed.   Follow-up in approximately six weeks by phone, and in-person in three months.  Patient expressed understanding and was in agreement with this plan. He also understands that He can call the clinic at any time with any questions, concerns, or complaints.     Any controlled substances utilized were prescribed in the context of palliative care. PDMP has been reviewed.    I provided 30 minutes of non face-to-face telephone visit time during this encounter, and > 50% was spent counseling as documented under my assessment & plan. Visit consisted of counseling and education dealing with the complex and emotionally intense issues of symptom management and palliative care in the setting of serious and potentially life-threatening illness.  Liz Claiborne,  AGPCNP-BC  Palliative Medicine Team/[facility]  "

## 2025-01-06 ENCOUNTER — Encounter: Payer: Self-pay | Admitting: Hematology

## 2025-01-26 ENCOUNTER — Telehealth: Payer: Self-pay

## 2025-01-26 ENCOUNTER — Other Ambulatory Visit: Payer: Self-pay | Admitting: Nurse Practitioner

## 2025-01-26 DIAGNOSIS — E8581 Light chain (AL) amyloidosis: Secondary | ICD-10-CM

## 2025-01-26 DIAGNOSIS — F419 Anxiety disorder, unspecified: Secondary | ICD-10-CM

## 2025-01-26 DIAGNOSIS — G893 Neoplasm related pain (acute) (chronic): Secondary | ICD-10-CM

## 2025-01-26 DIAGNOSIS — Z515 Encounter for palliative care: Secondary | ICD-10-CM

## 2025-01-26 DIAGNOSIS — I43 Cardiomyopathy in diseases classified elsewhere: Secondary | ICD-10-CM
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
## 2025-01-27 ENCOUNTER — Telehealth: Payer: Self-pay

## 2025-01-27 ENCOUNTER — Encounter: Payer: Self-pay | Admitting: Hematology

## 2025-01-27 NOTE — Telephone Encounter (Signed)
 Received refill request for this pt oxycodone  and xannax. RN spoke with pt and his SO yesterday and strongly recommended going to the ED. Upon chart review pt did not go to the hospital. RN called SO, Inocente, and informed her that we could not refill his medications d/t his current state, how weak and drowsy he already is. Education provided again on the reasons for going to the ED and how pt should not wait. Inocente verbalized understanding but made it clear that the pt was not willing to go at this time. NP made aware. No further needs at this time from this RN.

## 2025-01-31 ENCOUNTER — Inpatient Hospital Stay: Attending: Hematology | Admitting: Nurse Practitioner

## 2025-02-08 ENCOUNTER — Ambulatory Visit: Admitting: Dermatology

## 2025-02-23 ENCOUNTER — Inpatient Hospital Stay: Admitting: Hematology

## 2025-02-23 ENCOUNTER — Inpatient Hospital Stay
